# Patient Record
Sex: Female | Born: 1950 | Race: White | Hispanic: No | Smoking: Current every day smoker
Health system: Southern US, Community
[De-identification: ages and names within clinical notes are randomized; demographics above are authoritative.]

## PROBLEM LIST (undated history)

## (undated) DIAGNOSIS — I340 Nonrheumatic mitral (valve) insufficiency: Secondary | ICD-10-CM

## (undated) DIAGNOSIS — J42 Unspecified chronic bronchitis: Secondary | ICD-10-CM

## (undated) DIAGNOSIS — Z8719 Personal history of other diseases of the digestive system: Secondary | ICD-10-CM

## (undated) DIAGNOSIS — I499 Cardiac arrhythmia, unspecified: Secondary | ICD-10-CM

## (undated) DIAGNOSIS — I739 Peripheral vascular disease, unspecified: Secondary | ICD-10-CM

## (undated) DIAGNOSIS — K922 Gastrointestinal hemorrhage, unspecified: Secondary | ICD-10-CM

## (undated) DIAGNOSIS — R011 Cardiac murmur, unspecified: Secondary | ICD-10-CM

## (undated) DIAGNOSIS — N189 Chronic kidney disease, unspecified: Secondary | ICD-10-CM

## (undated) DIAGNOSIS — F329 Major depressive disorder, single episode, unspecified: Secondary | ICD-10-CM

## (undated) DIAGNOSIS — I509 Heart failure, unspecified: Secondary | ICD-10-CM

## (undated) DIAGNOSIS — J449 Chronic obstructive pulmonary disease, unspecified: Secondary | ICD-10-CM

## (undated) DIAGNOSIS — F32A Depression, unspecified: Secondary | ICD-10-CM

## (undated) DIAGNOSIS — I2510 Atherosclerotic heart disease of native coronary artery without angina pectoris: Secondary | ICD-10-CM

## (undated) DIAGNOSIS — M549 Dorsalgia, unspecified: Secondary | ICD-10-CM

## (undated) DIAGNOSIS — Z9981 Dependence on supplemental oxygen: Secondary | ICD-10-CM

## (undated) DIAGNOSIS — E119 Type 2 diabetes mellitus without complications: Secondary | ICD-10-CM

## (undated) DIAGNOSIS — I480 Paroxysmal atrial fibrillation: Secondary | ICD-10-CM

## (undated) DIAGNOSIS — G8929 Other chronic pain: Secondary | ICD-10-CM

## (undated) DIAGNOSIS — I219 Acute myocardial infarction, unspecified: Secondary | ICD-10-CM

## (undated) DIAGNOSIS — I10 Essential (primary) hypertension: Secondary | ICD-10-CM

## (undated) DIAGNOSIS — D649 Anemia, unspecified: Secondary | ICD-10-CM

## (undated) DIAGNOSIS — I771 Stricture of artery: Secondary | ICD-10-CM

## (undated) DIAGNOSIS — Z9289 Personal history of other medical treatment: Secondary | ICD-10-CM

## (undated) DIAGNOSIS — E785 Hyperlipidemia, unspecified: Secondary | ICD-10-CM

## (undated) DIAGNOSIS — I6529 Occlusion and stenosis of unspecified carotid artery: Secondary | ICD-10-CM

## (undated) DIAGNOSIS — K219 Gastro-esophageal reflux disease without esophagitis: Secondary | ICD-10-CM

## (undated) DIAGNOSIS — R9431 Abnormal electrocardiogram [ECG] [EKG]: Secondary | ICD-10-CM

## (undated) DIAGNOSIS — I714 Abdominal aortic aneurysm, without rupture: Secondary | ICD-10-CM

## (undated) DIAGNOSIS — K859 Acute pancreatitis without necrosis or infection, unspecified: Secondary | ICD-10-CM

## (undated) DIAGNOSIS — T4145XA Adverse effect of unspecified anesthetic, initial encounter: Secondary | ICD-10-CM

## (undated) HISTORY — DX: Chronic obstructive pulmonary disease, unspecified: J44.9

## (undated) HISTORY — DX: Chronic kidney disease, unspecified: N18.9

## (undated) HISTORY — DX: Abdominal aortic aneurysm, without rupture: I71.4

## (undated) HISTORY — DX: Hyperlipidemia, unspecified: E78.5

## (undated) HISTORY — PX: LACERATION REPAIR: SHX5168

## (undated) HISTORY — PX: ABDOMINAL HYSTERECTOMY: SHX81

## (undated) HISTORY — DX: Peripheral vascular disease, unspecified: I73.9

## (undated) HISTORY — DX: Essential (primary) hypertension: I10

## (undated) HISTORY — DX: Gastro-esophageal reflux disease without esophagitis: K21.9

## (undated) HISTORY — PX: CORONARY ANGIOPLASTY: SHX604

## (undated) HISTORY — DX: Acute myocardial infarction, unspecified: I21.9

## (undated) HISTORY — DX: Occlusion and stenosis of unspecified carotid artery: I65.29

## (undated) HISTORY — DX: Heart failure, unspecified: I50.9

## (undated) HISTORY — DX: Gastrointestinal hemorrhage, unspecified: K92.2

## (undated) HISTORY — DX: Paroxysmal atrial fibrillation: I48.0

## (undated) HISTORY — DX: Nonrheumatic mitral (valve) insufficiency: I34.0

## (undated) HISTORY — DX: Anemia, unspecified: D64.9

## (undated) HISTORY — PX: CAROTID ENDARTERECTOMY: SUR193

## (undated) HISTORY — PX: CORONARY STENT PLACEMENT: SHX1402

## (undated) HISTORY — PX: OTHER SURGICAL HISTORY: SHX169

## (undated) HISTORY — DX: Abnormal electrocardiogram (ECG) (EKG): R94.31

---
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
## 2001-11-20 ENCOUNTER — Emergency Department (HOSPITAL_COMMUNITY): Admission: EM | Admit: 2001-11-20 | Discharge: 2001-11-20 | Payer: Self-pay | Admitting: Emergency Medicine

## 2002-10-17 ENCOUNTER — Encounter: Admission: RE | Admit: 2002-10-17 | Discharge: 2002-10-17 | Payer: Self-pay | Admitting: Internal Medicine

## 2003-08-08 ENCOUNTER — Ambulatory Visit (HOSPITAL_COMMUNITY): Admission: RE | Admit: 2003-08-08 | Discharge: 2003-08-08 | Payer: Self-pay | Admitting: Vascular Surgery

## 2003-08-22 ENCOUNTER — Ambulatory Visit (HOSPITAL_COMMUNITY): Admission: RE | Admit: 2003-08-22 | Discharge: 2003-08-23 | Payer: Self-pay | Admitting: Vascular Surgery

## 2003-10-16 ENCOUNTER — Observation Stay (HOSPITAL_COMMUNITY): Admission: AD | Admit: 2003-10-16 | Discharge: 2003-10-17 | Payer: Self-pay | Admitting: Internal Medicine

## 2004-07-09 ENCOUNTER — Ambulatory Visit (HOSPITAL_COMMUNITY): Admission: RE | Admit: 2004-07-09 | Discharge: 2004-07-09 | Payer: Self-pay | Admitting: *Deleted

## 2004-07-09 ENCOUNTER — Encounter (INDEPENDENT_AMBULATORY_CARE_PROVIDER_SITE_OTHER): Payer: Self-pay | Admitting: *Deleted

## 2005-03-30 ENCOUNTER — Encounter: Admission: RE | Admit: 2005-03-30 | Discharge: 2005-03-30 | Payer: Self-pay | Admitting: Internal Medicine

## 2005-05-16 ENCOUNTER — Emergency Department (HOSPITAL_COMMUNITY): Admission: EM | Admit: 2005-05-16 | Discharge: 2005-05-16 | Payer: Self-pay | Admitting: Emergency Medicine

## 2005-06-02 ENCOUNTER — Emergency Department (HOSPITAL_COMMUNITY): Admission: EM | Admit: 2005-06-02 | Discharge: 2005-06-02 | Payer: Self-pay | Admitting: Emergency Medicine

## 2005-11-19 ENCOUNTER — Ambulatory Visit (HOSPITAL_COMMUNITY): Admission: RE | Admit: 2005-11-19 | Discharge: 2005-11-19 | Payer: Self-pay | Admitting: *Deleted

## 2005-11-19 ENCOUNTER — Encounter (INDEPENDENT_AMBULATORY_CARE_PROVIDER_SITE_OTHER): Payer: Self-pay | Admitting: Specialist

## 2005-12-31 ENCOUNTER — Ambulatory Visit (HOSPITAL_COMMUNITY): Admission: RE | Admit: 2005-12-31 | Discharge: 2005-12-31 | Payer: Self-pay | Admitting: Internal Medicine

## 2005-12-31 ENCOUNTER — Encounter: Payer: Self-pay | Admitting: Vascular Surgery

## 2006-05-16 ENCOUNTER — Inpatient Hospital Stay (HOSPITAL_COMMUNITY): Admission: EM | Admit: 2006-05-16 | Discharge: 2006-05-25 | Payer: Self-pay | Admitting: Emergency Medicine

## 2006-05-17 ENCOUNTER — Encounter (INDEPENDENT_AMBULATORY_CARE_PROVIDER_SITE_OTHER): Payer: Self-pay | Admitting: Cardiology

## 2006-08-31 ENCOUNTER — Encounter: Admission: RE | Admit: 2006-08-31 | Discharge: 2006-08-31 | Payer: Self-pay | Admitting: Internal Medicine

## 2007-07-29 ENCOUNTER — Emergency Department (HOSPITAL_COMMUNITY): Admission: EM | Admit: 2007-07-29 | Discharge: 2007-07-29 | Payer: Self-pay | Admitting: Emergency Medicine

## 2007-11-01 ENCOUNTER — Inpatient Hospital Stay (HOSPITAL_COMMUNITY): Admission: EM | Admit: 2007-11-01 | Discharge: 2007-11-04 | Payer: Self-pay | Admitting: Emergency Medicine

## 2007-12-19 ENCOUNTER — Encounter: Admission: RE | Admit: 2007-12-19 | Discharge: 2007-12-19 | Payer: Self-pay | Admitting: *Deleted

## 2008-01-05 DIAGNOSIS — I6529 Occlusion and stenosis of unspecified carotid artery: Secondary | ICD-10-CM

## 2008-01-05 DIAGNOSIS — I714 Abdominal aortic aneurysm, without rupture, unspecified: Secondary | ICD-10-CM

## 2008-01-05 HISTORY — DX: Occlusion and stenosis of unspecified carotid artery: I65.29

## 2008-01-05 HISTORY — DX: Abdominal aortic aneurysm, without rupture: I71.4

## 2008-01-05 HISTORY — DX: Abdominal aortic aneurysm, without rupture, unspecified: I71.40

## 2008-01-23 ENCOUNTER — Ambulatory Visit: Payer: Self-pay | Admitting: Internal Medicine

## 2008-01-23 ENCOUNTER — Ambulatory Visit: Payer: Self-pay | Admitting: Vascular Surgery

## 2008-01-24 ENCOUNTER — Ambulatory Visit: Payer: Self-pay | Admitting: Internal Medicine

## 2008-01-24 LAB — CONVERTED CEMR LAB
BUN: 6 mg/dL (ref 6–23)
Basophils Absolute: 0.1 10*3/uL (ref 0.0–0.1)
Basophils Relative: 0.8 % (ref 0.0–1.0)
CO2: 29 meq/L (ref 19–32)
Calcium: 9.7 mg/dL (ref 8.4–10.5)
Chloride: 107 meq/L (ref 96–112)
Creatinine, Ser: 1.2 mg/dL (ref 0.4–1.2)
Eosinophils Absolute: 0.3 10*3/uL (ref 0.0–0.7)
Eosinophils Relative: 3 % (ref 0.0–5.0)
GFR calc Af Amer: 60 mL/min
GFR calc non Af Amer: 50 mL/min
Glucose, Bld: 161 mg/dL — ABNORMAL HIGH (ref 70–99)
HCT: 36.2 % (ref 36.0–46.0)
Hemoglobin: 11.8 g/dL — ABNORMAL LOW (ref 12.0–15.0)
INR: 1 (ref 0.8–1.0)
Lymphocytes Relative: 13.4 % (ref 12.0–46.0)
MCHC: 32.7 g/dL (ref 30.0–36.0)
MCV: 91.3 fL (ref 78.0–100.0)
Monocytes Absolute: 0.5 10*3/uL (ref 0.1–1.0)
Monocytes Relative: 5.5 % (ref 3.0–12.0)
Neutro Abs: 7.1 10*3/uL (ref 1.4–7.7)
Neutrophils Relative %: 77.3 % — ABNORMAL HIGH (ref 43.0–77.0)
Platelets: 314 10*3/uL (ref 150–400)
Potassium: 3.8 meq/L (ref 3.5–5.1)
Prothrombin Time: 11.5 s (ref 10.9–13.3)
RBC: 3.96 M/uL (ref 3.87–5.11)
RDW: 14.9 % — ABNORMAL HIGH (ref 11.5–14.6)
Sodium: 141 meq/L (ref 135–145)
WBC: 9.2 10*3/uL (ref 4.5–10.5)
aPTT: 33.9 s — ABNORMAL HIGH (ref 21.7–29.8)

## 2008-01-26 ENCOUNTER — Ambulatory Visit (HOSPITAL_COMMUNITY): Admission: RE | Admit: 2008-01-26 | Discharge: 2008-01-26 | Payer: Self-pay | Admitting: Internal Medicine

## 2008-01-26 ENCOUNTER — Ambulatory Visit: Payer: Self-pay | Admitting: Internal Medicine

## 2008-02-02 ENCOUNTER — Ambulatory Visit: Payer: Self-pay | Admitting: Vascular Surgery

## 2008-02-02 ENCOUNTER — Encounter: Payer: Self-pay | Admitting: Vascular Surgery

## 2008-02-02 ENCOUNTER — Inpatient Hospital Stay (HOSPITAL_COMMUNITY): Admission: RE | Admit: 2008-02-02 | Discharge: 2008-02-03 | Payer: Self-pay | Admitting: Vascular Surgery

## 2008-02-13 ENCOUNTER — Ambulatory Visit: Payer: Self-pay | Admitting: Vascular Surgery

## 2008-03-05 ENCOUNTER — Ambulatory Visit: Payer: Self-pay | Admitting: Internal Medicine

## 2008-03-05 LAB — CONVERTED CEMR LAB
BUN: 12 mg/dL (ref 6–23)
Basophils Absolute: 0.1 10*3/uL (ref 0.0–0.1)
Basophils Relative: 1.2 % — ABNORMAL HIGH (ref 0.0–1.0)
CO2: 27 meq/L (ref 19–32)
Calcium: 10 mg/dL (ref 8.4–10.5)
Chloride: 108 meq/L (ref 96–112)
Creatinine, Ser: 1.3 mg/dL — ABNORMAL HIGH (ref 0.4–1.2)
Eosinophils Absolute: 0.4 10*3/uL (ref 0.0–0.7)
Eosinophils Relative: 5.2 % — ABNORMAL HIGH (ref 0.0–5.0)
GFR calc Af Amer: 55 mL/min
GFR calc non Af Amer: 45 mL/min
Glucose, Bld: 106 mg/dL — ABNORMAL HIGH (ref 70–99)
HCT: 33.1 % — ABNORMAL LOW (ref 36.0–46.0)
Hemoglobin: 11.3 g/dL — ABNORMAL LOW (ref 12.0–15.0)
INR: 0.9 (ref 0.8–1.0)
Lymphocytes Relative: 15.7 % (ref 12.0–46.0)
MCHC: 34.1 g/dL (ref 30.0–36.0)
MCV: 89.3 fL (ref 78.0–100.0)
Monocytes Absolute: 0.5 10*3/uL (ref 0.1–1.0)
Monocytes Relative: 6.1 % (ref 3.0–12.0)
Neutro Abs: 6.2 10*3/uL (ref 1.4–7.7)
Neutrophils Relative %: 71.8 % (ref 43.0–77.0)
Platelets: 298 10*3/uL (ref 150–400)
Potassium: 4.3 meq/L (ref 3.5–5.1)
Prothrombin Time: 10.7 s — ABNORMAL LOW (ref 10.9–13.3)
RBC: 3.71 M/uL — ABNORMAL LOW (ref 3.87–5.11)
RDW: 14.3 % (ref 11.5–14.6)
Sodium: 143 meq/L (ref 135–145)
WBC: 8.5 10*3/uL (ref 4.5–10.5)

## 2008-05-07 ENCOUNTER — Ambulatory Visit: Payer: Self-pay | Admitting: Internal Medicine

## 2008-05-07 LAB — CONVERTED CEMR LAB
ALT: 11 units/L (ref 0–35)
AST: 15 units/L (ref 0–37)
Albumin: 3.8 g/dL (ref 3.5–5.2)
Alkaline Phosphatase: 64 units/L (ref 39–117)
BUN: 13 mg/dL (ref 6–23)
Bilirubin, Direct: 0.1 mg/dL (ref 0.0–0.3)
CO2: 28 meq/L (ref 19–32)
Calcium: 9.5 mg/dL (ref 8.4–10.5)
Chloride: 108 meq/L (ref 96–112)
Cholesterol: 208 mg/dL (ref 0–200)
Creatinine, Ser: 1.2 mg/dL (ref 0.4–1.2)
Direct LDL: 78.9 mg/dL
GFR calc Af Amer: 60 mL/min
GFR calc non Af Amer: 50 mL/min
Glucose, Bld: 101 mg/dL — ABNORMAL HIGH (ref 70–99)
HDL: 36.7 mg/dL — ABNORMAL LOW (ref >39.0)
Potassium: 4.2 meq/L (ref 3.5–5.1)
Sodium: 142 meq/L (ref 135–145)
Total Bilirubin: 0.6 mg/dL (ref 0.3–1.2)
Total CHOL/HDL Ratio: 5.7
Total Protein: 7.2 g/dL (ref 6.0–8.3)
Triglycerides: 400 mg/dL (ref 0–149)
VLDL: 80 mg/dL — ABNORMAL HIGH (ref 0–40)

## 2008-07-16 ENCOUNTER — Ambulatory Visit: Payer: Self-pay | Admitting: Internal Medicine

## 2008-08-16 ENCOUNTER — Ambulatory Visit: Payer: Self-pay | Admitting: Internal Medicine

## 2008-08-16 LAB — CONVERTED CEMR LAB
BUN: 14 mg/dL (ref 6–23)
CO2: 21 meq/L (ref 19–32)
Calcium: 9 mg/dL (ref 8.4–10.5)
Chloride: 108 meq/L (ref 96–112)
Creatinine, Ser: 1.57 mg/dL — ABNORMAL HIGH (ref 0.40–1.20)
Glucose, Bld: 114 mg/dL — ABNORMAL HIGH (ref 70–99)
Potassium: 4.3 meq/L (ref 3.5–5.3)
Sodium: 142 meq/L (ref 135–145)

## 2008-09-03 ENCOUNTER — Encounter: Payer: Self-pay | Admitting: Internal Medicine

## 2008-09-03 ENCOUNTER — Ambulatory Visit: Payer: Self-pay | Admitting: Cardiovascular Disease

## 2008-09-03 LAB — CONVERTED CEMR LAB
BUN: 16 mg/dL (ref 6–23)
CO2: 20 meq/L (ref 19–32)
Calcium: 9 mg/dL (ref 8.4–10.5)
Chloride: 110 meq/L (ref 96–112)
Creatinine, Ser: 1.54 mg/dL — ABNORMAL HIGH (ref 0.40–1.20)
Glucose, Bld: 99 mg/dL (ref 70–99)
Potassium: 4.4 meq/L (ref 3.5–5.3)
Sodium: 142 meq/L (ref 135–145)

## 2008-11-20 DIAGNOSIS — I25119 Atherosclerotic heart disease of native coronary artery with unspecified angina pectoris: Secondary | ICD-10-CM | POA: Insufficient documentation

## 2008-11-20 DIAGNOSIS — I10 Essential (primary) hypertension: Secondary | ICD-10-CM

## 2008-11-20 DIAGNOSIS — E785 Hyperlipidemia, unspecified: Secondary | ICD-10-CM | POA: Insufficient documentation

## 2008-11-20 DIAGNOSIS — I714 Abdominal aortic aneurysm, without rupture, unspecified: Secondary | ICD-10-CM | POA: Insufficient documentation

## 2008-11-20 DIAGNOSIS — F17200 Nicotine dependence, unspecified, uncomplicated: Secondary | ICD-10-CM | POA: Insufficient documentation

## 2008-11-21 ENCOUNTER — Ambulatory Visit: Payer: Self-pay | Admitting: Internal Medicine

## 2008-11-21 ENCOUNTER — Encounter: Payer: Self-pay | Admitting: Internal Medicine

## 2008-11-21 DIAGNOSIS — I739 Peripheral vascular disease, unspecified: Secondary | ICD-10-CM

## 2008-11-21 LAB — CONVERTED CEMR LAB
ALT: 9 units/L (ref 0–35)
AST: 15 units/L (ref 0–37)
Albumin: 4.5 g/dL (ref 3.5–5.2)
Alkaline Phosphatase: 39 units/L (ref 39–117)
BUN: 12 mg/dL (ref 6–23)
CO2: 19 meq/L (ref 19–32)
Calcium: 9.3 mg/dL (ref 8.4–10.5)
Chloride: 110 meq/L (ref 96–112)
Cholesterol: 172 mg/dL (ref 0–200)
Creatinine, Ser: 1.8 mg/dL — ABNORMAL HIGH (ref 0.40–1.20)
Glucose, Bld: 114 mg/dL — ABNORMAL HIGH (ref 70–99)
HDL: 44 mg/dL (ref >39)
LDL Cholesterol: 85 mg/dL (ref 0–99)
Potassium: 4.3 meq/L (ref 3.5–5.3)
Sodium: 143 meq/L (ref 135–145)
Total Bilirubin: 0.4 mg/dL (ref 0.3–1.2)
Total CHOL/HDL Ratio: 3.9
Total Protein: 7.2 g/dL (ref 6.0–8.3)
Triglycerides: 216 mg/dL — ABNORMAL HIGH (ref <150)
VLDL: 43 mg/dL — ABNORMAL HIGH (ref 0–40)

## 2008-12-23 ENCOUNTER — Ambulatory Visit: Payer: Self-pay | Admitting: Internal Medicine

## 2008-12-23 ENCOUNTER — Encounter: Payer: Self-pay | Admitting: Internal Medicine

## 2008-12-23 LAB — CONVERTED CEMR LAB
BUN: 20 mg/dL (ref 6–23)
CO2: 21 meq/L (ref 19–32)
Calcium: 9.3 mg/dL (ref 8.4–10.5)
Chloride: 110 meq/L (ref 96–112)
Creatinine, Ser: 1.8 mg/dL — ABNORMAL HIGH (ref 0.40–1.20)
Glucose, Bld: 110 mg/dL — ABNORMAL HIGH (ref 70–99)
Potassium: 4.3 meq/L (ref 3.5–5.3)
Sodium: 145 meq/L (ref 135–145)

## 2008-12-27 ENCOUNTER — Telehealth: Payer: Self-pay | Admitting: Internal Medicine

## 2009-03-25 ENCOUNTER — Ambulatory Visit: Payer: Self-pay | Admitting: Vascular Surgery

## 2009-03-28 ENCOUNTER — Ambulatory Visit: Payer: Self-pay | Admitting: Internal Medicine

## 2009-03-28 DIAGNOSIS — R634 Abnormal weight loss: Secondary | ICD-10-CM

## 2009-03-28 DIAGNOSIS — R002 Palpitations: Secondary | ICD-10-CM

## 2009-04-09 ENCOUNTER — Encounter: Payer: Self-pay | Admitting: Cardiology

## 2009-04-23 ENCOUNTER — Encounter: Payer: Self-pay | Admitting: Cardiology

## 2009-12-05 ENCOUNTER — Encounter: Payer: Self-pay | Admitting: Internal Medicine

## 2009-12-06 ENCOUNTER — Encounter: Payer: Self-pay | Admitting: Internal Medicine

## 2009-12-07 ENCOUNTER — Encounter: Payer: Self-pay | Admitting: Internal Medicine

## 2009-12-09 ENCOUNTER — Telehealth (INDEPENDENT_AMBULATORY_CARE_PROVIDER_SITE_OTHER): Payer: Self-pay | Admitting: *Deleted

## 2009-12-09 ENCOUNTER — Ambulatory Visit: Payer: Self-pay | Admitting: Internal Medicine

## 2009-12-09 DIAGNOSIS — I4891 Unspecified atrial fibrillation: Secondary | ICD-10-CM | POA: Insufficient documentation

## 2009-12-09 LAB — CONVERTED CEMR LAB: POC INR: 5

## 2009-12-10 ENCOUNTER — Telehealth (INDEPENDENT_AMBULATORY_CARE_PROVIDER_SITE_OTHER): Payer: Self-pay | Admitting: *Deleted

## 2009-12-11 ENCOUNTER — Ambulatory Visit: Payer: Self-pay | Admitting: Internal Medicine

## 2009-12-11 LAB — CONVERTED CEMR LAB
Basophils Absolute: 0.1 10*3/uL (ref 0.0–0.1)
Basophils Relative: 1.1 % (ref 0.0–3.0)
Eosinophils Absolute: 0.2 10*3/uL (ref 0.0–0.7)
Eosinophils Relative: 3.1 % (ref 0.0–5.0)
HCT: 39.1 % (ref 36.0–46.0)
Hemoglobin: 13.4 g/dL (ref 12.0–15.0)
Lymphocytes Relative: 11.1 % — ABNORMAL LOW (ref 12.0–46.0)
Lymphs Abs: 0.8 10*3/uL (ref 0.7–4.0)
MCHC: 34.1 g/dL (ref 30.0–36.0)
MCV: 90.8 fL (ref 78.0–100.0)
Monocytes Absolute: 0.7 10*3/uL (ref 0.1–1.0)
Monocytes Relative: 10.2 % (ref 3.0–12.0)
Neutro Abs: 5.2 10*3/uL (ref 1.4–7.7)
Neutrophils Relative %: 74.5 % (ref 43.0–77.0)
Platelets: 269 10*3/uL (ref 150.0–400.0)
RBC: 4.31 M/uL (ref 3.87–5.11)
RDW: 15.6 % — ABNORMAL HIGH (ref 11.5–14.6)
WBC: 6.9 10*3/uL (ref 4.5–10.5)

## 2009-12-12 ENCOUNTER — Ambulatory Visit: Payer: Self-pay | Admitting: Cardiology

## 2009-12-18 ENCOUNTER — Ambulatory Visit: Payer: Self-pay | Admitting: Internal Medicine

## 2009-12-18 LAB — CONVERTED CEMR LAB
INR: 2.6
POC INR: 2.6

## 2009-12-24 ENCOUNTER — Encounter (INDEPENDENT_AMBULATORY_CARE_PROVIDER_SITE_OTHER): Payer: Self-pay | Admitting: *Deleted

## 2009-12-24 ENCOUNTER — Ambulatory Visit: Payer: Self-pay | Admitting: Cardiology

## 2009-12-24 LAB — CONVERTED CEMR LAB: POC INR: 2.4

## 2010-01-09 ENCOUNTER — Ambulatory Visit: Payer: Self-pay | Admitting: Internal Medicine

## 2010-01-09 LAB — CONVERTED CEMR LAB: POC INR: 1.9

## 2010-01-23 ENCOUNTER — Ambulatory Visit: Payer: Self-pay | Admitting: Internal Medicine

## 2010-01-23 LAB — CONVERTED CEMR LAB: POC INR: 1.5

## 2010-02-06 ENCOUNTER — Ambulatory Visit: Payer: Self-pay | Admitting: Cardiovascular Disease

## 2010-02-06 LAB — CONVERTED CEMR LAB: POC INR: 2.3

## 2010-03-03 ENCOUNTER — Ambulatory Visit: Payer: Self-pay | Admitting: Internal Medicine

## 2010-03-03 LAB — CONVERTED CEMR LAB: POC INR: 2.9

## 2010-03-31 ENCOUNTER — Ambulatory Visit: Payer: Self-pay | Admitting: Cardiology

## 2010-03-31 LAB — CONVERTED CEMR LAB: POC INR: 1.2

## 2010-04-13 ENCOUNTER — Ambulatory Visit: Payer: Self-pay | Admitting: Cardiovascular Disease

## 2010-04-13 LAB — CONVERTED CEMR LAB: POC INR: 2

## 2010-04-27 ENCOUNTER — Ambulatory Visit: Payer: Self-pay | Admitting: Cardiology

## 2010-04-27 LAB — CONVERTED CEMR LAB: POC INR: 2.6

## 2010-04-28 ENCOUNTER — Encounter: Payer: Self-pay | Admitting: Internal Medicine

## 2010-04-28 ENCOUNTER — Ambulatory Visit: Payer: Self-pay | Admitting: Vascular Surgery

## 2010-05-07 DIAGNOSIS — K922 Gastrointestinal hemorrhage, unspecified: Secondary | ICD-10-CM

## 2010-05-07 HISTORY — DX: Gastrointestinal hemorrhage, unspecified: K92.2

## 2010-05-21 ENCOUNTER — Ambulatory Visit: Payer: Self-pay | Admitting: Cardiology

## 2010-05-21 LAB — CONVERTED CEMR LAB: POC INR: 2.4

## 2010-06-18 ENCOUNTER — Ambulatory Visit: Payer: Self-pay | Admitting: Internal Medicine

## 2010-06-18 LAB — CONVERTED CEMR LAB: POC INR: 5

## 2010-06-22 ENCOUNTER — Telehealth: Payer: Self-pay | Admitting: Internal Medicine

## 2010-06-25 ENCOUNTER — Telehealth (INDEPENDENT_AMBULATORY_CARE_PROVIDER_SITE_OTHER): Payer: Self-pay | Admitting: Pharmacist

## 2010-07-03 ENCOUNTER — Telehealth: Payer: Self-pay | Admitting: Internal Medicine

## 2010-07-15 ENCOUNTER — Ambulatory Visit: Payer: Self-pay | Admitting: Cardiovascular Disease

## 2010-07-15 ENCOUNTER — Ambulatory Visit: Payer: Self-pay | Admitting: Internal Medicine

## 2010-07-15 LAB — CONVERTED CEMR LAB: POC INR: 1.2

## 2010-07-17 ENCOUNTER — Ambulatory Visit: Payer: Self-pay | Admitting: Internal Medicine

## 2010-07-17 ENCOUNTER — Telehealth (INDEPENDENT_AMBULATORY_CARE_PROVIDER_SITE_OTHER): Payer: Self-pay | Admitting: *Deleted

## 2010-07-17 ENCOUNTER — Encounter: Payer: Self-pay | Admitting: Internal Medicine

## 2010-07-22 ENCOUNTER — Ambulatory Visit: Payer: Self-pay | Admitting: Cardiology

## 2010-07-24 ENCOUNTER — Telehealth: Payer: Self-pay | Admitting: Internal Medicine

## 2010-08-05 ENCOUNTER — Ambulatory Visit: Payer: Self-pay | Admitting: Pulmonary Disease

## 2010-08-05 DIAGNOSIS — R0609 Other forms of dyspnea: Secondary | ICD-10-CM

## 2010-08-05 DIAGNOSIS — R0989 Other specified symptoms and signs involving the circulatory and respiratory systems: Secondary | ICD-10-CM

## 2010-08-05 LAB — CONVERTED CEMR LAB
BUN: 22 mg/dL (ref 6–23)
Basophils Absolute: 0.1 10*3/uL (ref 0.0–0.1)
Basophils Relative: 0.8 % (ref 0.0–3.0)
CO2: 26 meq/L (ref 19–32)
Calcium: 9.2 mg/dL (ref 8.4–10.5)
Chloride: 106 meq/L (ref 96–112)
Creatinine, Ser: 1.7 mg/dL — ABNORMAL HIGH (ref 0.4–1.2)
Eosinophils Absolute: 0.3 10*3/uL (ref 0.0–0.7)
Eosinophils Relative: 4.3 % (ref 0.0–5.0)
GFR calc non Af Amer: 32.23 mL/min (ref >60)
Glucose, Bld: 108 mg/dL — ABNORMAL HIGH (ref 70–99)
HCT: 32.3 % — ABNORMAL LOW (ref 36.0–46.0)
Hemoglobin: 11.1 g/dL — ABNORMAL LOW (ref 12.0–15.0)
Lymphocytes Relative: 17.6 % (ref 12.0–46.0)
Lymphs Abs: 1.2 10*3/uL (ref 0.7–4.0)
MCHC: 34.5 g/dL (ref 30.0–36.0)
MCV: 93.1 fL (ref 78.0–100.0)
Monocytes Absolute: 0.6 10*3/uL (ref 0.1–1.0)
Monocytes Relative: 8.1 % (ref 3.0–12.0)
Neutro Abs: 4.9 10*3/uL (ref 1.4–7.7)
Neutrophils Relative %: 69.2 % (ref 43.0–77.0)
Platelets: 225 10*3/uL (ref 150.0–400.0)
Potassium: 4.6 meq/L (ref 3.5–5.1)
RBC: 3.48 M/uL — ABNORMAL LOW (ref 3.87–5.11)
RDW: 14.2 % (ref 11.5–14.6)
Sodium: 141 meq/L (ref 135–145)
WBC: 7.1 10*3/uL (ref 4.5–10.5)

## 2010-08-07 ENCOUNTER — Ambulatory Visit: Payer: Self-pay | Admitting: Cardiovascular Disease

## 2010-08-07 LAB — CONVERTED CEMR LAB: POC INR: 5.5

## 2010-08-13 ENCOUNTER — Telehealth: Payer: Self-pay | Admitting: Internal Medicine

## 2010-08-14 ENCOUNTER — Ambulatory Visit: Payer: Self-pay | Admitting: Internal Medicine

## 2010-08-14 LAB — CONVERTED CEMR LAB: POC INR: 5

## 2010-08-21 ENCOUNTER — Encounter: Payer: Self-pay | Admitting: Pulmonary Disease

## 2010-08-21 ENCOUNTER — Ambulatory Visit: Payer: Self-pay | Admitting: Cardiology

## 2010-08-24 ENCOUNTER — Ambulatory Visit: Payer: Self-pay | Admitting: Pulmonary Disease

## 2010-09-04 ENCOUNTER — Ambulatory Visit: Payer: Self-pay | Admitting: Pulmonary Disease

## 2010-09-04 ENCOUNTER — Ambulatory Visit: Payer: Self-pay | Admitting: Cardiovascular Disease

## 2010-09-04 DIAGNOSIS — G4733 Obstructive sleep apnea (adult) (pediatric): Secondary | ICD-10-CM | POA: Insufficient documentation

## 2010-09-04 DIAGNOSIS — R0902 Hypoxemia: Secondary | ICD-10-CM | POA: Insufficient documentation

## 2010-09-04 LAB — CONVERTED CEMR LAB: POC INR: 2.9

## 2010-09-23 ENCOUNTER — Encounter: Payer: Self-pay | Admitting: Internal Medicine

## 2010-09-23 ENCOUNTER — Ambulatory Visit
Admission: RE | Admit: 2010-09-23 | Discharge: 2010-09-23 | Payer: Self-pay | Source: Home / Self Care | Attending: Internal Medicine | Admitting: Internal Medicine

## 2010-10-02 ENCOUNTER — Ambulatory Visit: Admit: 2010-10-02 | Payer: Self-pay

## 2010-10-06 ENCOUNTER — Ambulatory Visit: Admit: 2010-10-06 | Payer: Self-pay

## 2010-10-06 NOTE — Letter (Signed)
Summary: New Patient Letter  [facility]   [place], [place] [address]   Phone: [phone]  Fax: [phone]       12/24/2009 MRN: [id number]  [name] [address] [place], [place]  [address]  Dear Ms. Petrizzo,   Welcome to [facility] and thank you for choosing [place] as your Primary Care Providers. Enclosed you will find information about our practice that we hope you find helpful. We have also enclosed forms to be filled out prior to your visit. This will provide [place] with the necessary information and facilitate your being seen in a timely manner. If you have any questions, please call us at:  [phone]        and we will be happy to assist you. We look forward to seeing you at your scheduled appointment time.  Appointment   MAY [phone] @ 10:00AM            with Dr.  Birdie Riddle               Sincerely,  [facility]  Please arrive 15 minutes early for your first appointment and bring your insurance card. Co-pay is required at the time of your visit.  *****Please call the office if you are not able to keep this appointment. There is a charge of $50.00 if any appointment is not cancelled or rescheduled within 24 hours.

## 2010-10-06 NOTE — Medication Information (Signed)
Summary: rov/sp  Anticoagulant Therapy  Managed by: Tula Nakayama, RN, BSN Referring MD: Dr Haroldine Laws PCP: Bernerd Limbo, [facility] MD: Rayann Heman MD, Jeneen Rinks Indication 1: Atrial Fibrillation Lab Used: [facility] Site: [place] INR POC 5.0 INR RANGE 2.0-3.0  Dietary changes: no    Health status changes: no    Bleeding/hemorrhagic complications: no    Recent/future hospitalizations: yes       Details: Went to ER on Wed for Afib, Started on Cardizem   Any changes in medication regimen? yes       Details: Tylenol PRN 2 tabs daily  Recent/future dental: no  Any missed doses?: no       Is patient compliant with meds? yes       Allergies: 1)  ! Codeine 2)  ! Clonidine Hcl  Anticoagulation Management History:      The patient is taking warfarin and comes in today for a routine follow up visit.  Positive risk factors for bleeding include presence of serious comorbidities.  Negative risk factors for bleeding include an age less than 53 years old.  The bleeding index is 'intermediate risk'.  Positive CHADS2 values include History of HTN.  Negative CHADS2 values include Age > 40 years old.  Her last INR was 2.6.  Anticoagulation responsible provider: Allred MD, Jeneen Rinks.  INR POC: 5.0.  Cuvette Lot#: [id number].  Exp: 10/2011.    Anticoagulation Management Assessment/Plan:      The patient's current anticoagulation dose is Coumadin 1 mg tabs: Take as Directed..  The target INR is 2.0-3.0.  The next INR is due 08/21/2010.  Anticoagulation instructions were given to patient.  Results were reviewed/authorized by Tula Nakayama, RN, BSN.  She was notified by Tula Nakayama, RN, BSN.         Prior Anticoagulation Instructions: INR 5.5  Skip today and tomorrows dose of Coumadin then decrease dose to 1 tablet every day.  Recheck INR in 1 week.   Current Anticoagulation Instructions: INR 5.0 Skip today and Saturdays dose then change dose to  1 pill everyday except 1/2 pill on Mondays and Fridays. Recheck in one week.

## 2010-10-06 NOTE — Medication Information (Signed)
Summary: rov/cb  Anticoagulant Therapy  Managed by: Gwynneth Albright, PharmD PCP: Charlynn Grimes in [place], [place] Supervising MD: Verl Blalock MD, Marcello Moores Indication 1: Atrial Fibrillation [place] Site: [place] INR RANGE 2.0-3.0  Dietary changes: no    Health status changes: no    Bleeding/hemorrhagic complications: no    Recent/future hospitalizations: no    Any changes in medication regimen? no    Recent/future dental: no  Any missed doses?: no       Is patient compliant with meds? yes       Allergies: 1)  ! Codeine 2)  ! Clonidine Hcl  Anticoagulation Management History:      Positive risk factors for bleeding include presence of serious comorbidities.  Negative risk factors for bleeding include an age less than 33 years old.  The bleeding index is 'intermediate risk'.  Positive CHADS2 values include History of HTN.  Negative CHADS2 values include Age > 89 years old.  Her last INR was 0.9 RATIO.  Anticoagulation responsible provider: Verl Blalock MD, Marcello Moores.  Exp: 01/2011.    Anticoagulation Management Assessment/Plan:      The patient's current anticoagulation dose is Coumadin 5 mg tabs: Take 1 tablet daily or as directed by Coumadin Clinic..  The next INR is due 12/18/2009.  Results were reviewed/authorized by Gwynneth Albright, PharmD.  She was notified by Gwynneth Albright, PharmD.         Prior Anticoagulation Instructions: INR 5.0. Hold Coumadin today and tomorrow, then take 1 tablet daily of the Coumadin 1 mg tablets.  Recheck on Friday.  Current Anticoagulation Instructions: INR 2.6. Take 1 tablet daily. Recheck on Thursday.

## 2010-10-06 NOTE — Assessment & Plan Note (Signed)
Summary: eph/ gd   Visit Type:  post hospital Primary Provider:  Charlynn Grimes in [place], [place]  CC:  palpitations.  History of Present Illness: 60 y/o woman with severe vascular disease as listed below. Returns for routine f/u.  [place]. Earlier in the week was admitted to [facility] with palpitations in rapid atrial fib with HRs up to 150-160. Treated with cardizem and HRs got down to 40. Thought she might have tachy-brady. Converted spontaneously after about 6 or 7 hours. Troponin went up to 0.28. No CP.  Told she might need a pacer in future. Was started on coumadin.  No bleeding INR checked Tuesday and it was 5.o and coumadin dose adjusted. No CP. Mild DOE. No change. No HF. Occasional brief flutters no sustained palpitations. Snores heavily.  Using lots of salt. Checks BP regularly mean BP about 150/70  Also got 2u RBCs for iron deficinecy anemia.   Still smoking about 4-5 cigs/day trying hard to quit.   Preoperative Cardiovascular Risk Evaluation:  Clinical Predictors of Increased Perioperative Cardiovascular Risk (MI, CHF, Death):     Minor Risk:    Advanced age (>65 years):     no    Current Medications (verified): 1)  Lisinopril-Hydrochlorothiazide 20-25 Mg Tabs (Lisinopril-Hydrochlorothiazide) .Marland Kitchen.. 1 By Mouth Daily 2)  Hydralazine Hcl 100 Mg Tabs (Hydralazine Hcl) .Marland Kitchen.. 1 By Mouth Twice Daily 3)  Spironolactone 25 Mg Tabs (Spironolactone) .... Take One Tablet By Mouth Daily 4)  Carvedilol 25 Mg Tabs (Carvedilol) .... Take One Tablet By Mouth Twice A Day 5)  Fenofibrate Micronized 134 Mg Caps (Fenofibrate Micronized) .Marland Kitchen.. 1 By Mouth At Night 6)  Omeprazole 20 Mg Cpdr (Omeprazole) .Marland Kitchen.. 1 By Mouth Daily 7)  Simvastatin 80 Mg Tabs (Simvastatin) .... Take One Tablet By Mouth Daily At Bedtime 8)  Amlodipine Besylate 10 Mg Tabs (Amlodipine Besylate) .... Take One Tablet By Mouth Daily 9)  Aspirin Ec 325 Mg Tbec (Aspirin) .... Take 3   Tablets By Mouth Daily ( Hold) 10)  Cyanocobalamin 1000 Mcg/ml Soln (Cyanocobalamin) .... Once A Mth 11)  Vitamin D3 1000 Unit Tabs (Cholecalciferol) .... Two Times A Day 12)  Multivitamins   Tabs (Multiple Vitamin) .... Once Daily 13)  Trazodone Hcl 50 Mg Tabs (Trazodone Hcl) .Marland Kitchen.. 1 -2 At Bedtime As Needed 14)  Wellbutrin Sr 150 Mg Xr12h-Tab (Bupropion Hcl) .... 2 in The Morning A 1 At Night 15)  Coumadin 5 Mg Tabs (Warfarin Sodium) .... Take 1 Tablet Daily or As Directed By Coumadin Clinic. 16)  Citalopram Hydrobromide 20 Mg Tabs (Citalopram Hydrobromide) .... Once Daily  Allergies (verified): 1)  ! Codeine 2)  ! Clonidine Hcl  Past History:  Past Medical History:  1. CAD      a. cath 5/09 heavily calcified vessels. LAD 60,60m, 90 distal, LCX 60, 40 RCA 40s. normal EF            -->medical Rx  2. Severe PAD      a. s/p bilateral renal artery stents. moderate in-stent restenosis      b. AAA (3.7 x 3.3) by u/s 5/09  3. Carotid stenosis      a. s/p L CEA 5/09  4. Hypertension.   5. Hyperlipidemia.   6. COPD with ongoing tobacco use   7. GERD with hiatal hernia.   8. DM2 9. Paroxysmal AF  Review of Systems       As per HPI and past medical history; otherwise  all systems negative.   Vital Signs:  Patient profile:   60 year old female Height:      5.6 inches Weight:      147 pounds BMI:     3307.59 Pulse rate:   67 / minute BP sitting:   172 / 76  (left arm) Cuff size:   regular  Vitals Entered By: Mignon Pine, RMA (December 11, 2009 4:14 PM)  Physical Exam  General:  Gen: well appearing. no resp difficulty HEENT: normal Neck: supple. no JVD. Carotids 2+ bilat; Left CEA. + L bruit. No lymphadenopathy or thryomegaly appreciated. Cor: PMI nondisplaced. Regular rate & rhythm. No rubs, gallops, murmur. Lungs: clear Abdomen: soft, nontender, nondistended. No hepatosplenomegaly. No bruits or masses. Good bowel sounds. Extremities: no cyanosis, clubbing, rash, edema. DP  trace L, 2+R  no sores Neuro: alert & orientedx3, cranial nerves grossly intact. moves all 4 extremities w/o difficulty. affect pleasant    Impression & Recommendations:  Problem # 1:  CAD, NATIVE VESSEL (ICD-414.01) Stable. No evidence of ischemia. Continue current regimen.  Problem # 2:  ATRIAL FIBRILLATION (ICD-427.31) Quiescent. 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 if recurs may benefit from sotalol. Continue coumadin. For INR recheck tomorrow.  Problem # 3:  TOBACCO ABUSE (ICD-305.1) Counseled on need to stop smoking.  Problem # 4:  PVD (ICD-443.9) Followed by Dr. [name].  Problem # 5:  HYPERTENSION, BENIGN (ICD-401.1) BP remains elevated. Suspect may be related to salt. Will cut down salt and have her keep BP log.  Med options limited at this point as she can't tolerate clonidine. May consider doubling norvasc to 10 two times a day or adding doxazosin.  Problem # 6:  HYPERLIPIDEMIA-MIXED (B2193296.4) Needs aggressive lipid management with goal LDL < 70. Followed by PCP. Once Lipitor is generic will switch simva 80 to Lipitor 80 due to [facility] warning with high-dose simva.  Other Orders: EKG w/ Interpretation (93000)  Patient Instructions: 1)  Your physician has requested that you limit the intake of sodium (salt) in your diet to two grams daily. 2)  Follow up in 2 months

## 2010-10-06 NOTE — Medication Information (Signed)
Summary: ROV/TP  Anticoagulant Therapy  Managed by: Porfirio Oar, PharmD Referring MD: Dr Haroldine Laws PCP: Bernerd Limbo, [facility] MD: Percival Spanish MD, Jeneen Rinks Indication 1: Atrial Fibrillation [place] Site: [place] INR POC 2.6 INR RANGE 2.0-3.0  Dietary changes: no    Health status changes: no    Bleeding/hemorrhagic complications: no    Recent/future hospitalizations: no    Any changes in medication regimen? no    Recent/future dental: no  Any missed doses?: no       Is patient compliant with meds? yes       Allergies: 1)  ! Codeine 2)  ! Clonidine Hcl  Anticoagulation Management History:      The patient is taking warfarin and comes in today for a routine follow up visit.  Positive risk factors for bleeding include presence of serious comorbidities.  Negative risk factors for bleeding include an age less than 65 years old.  The bleeding index is 'intermediate risk'.  Positive CHADS2 values include History of HTN.  Negative CHADS2 values include Age > 52 years old.  Her last INR was 2.6.  Anticoagulation responsible provider: Percival Spanish MD, Jeneen Rinks.  INR POC: 2.6.  Cuvette Lot#: [id number].  Exp: 06/2011.    Anticoagulation Management Assessment/Plan:      The patient's current anticoagulation dose is Coumadin 1 mg tabs: Take as Directed..  The target INR is 2.0-3.0.  The next INR is due 05/20/2010.  Anticoagulation instructions were given to patient.  Results were reviewed/authorized by Porfirio Oar, PharmD.  She was notified by Aubery Lapping, PharmD Candidate.         Prior Anticoagulation Instructions: INR:  2.0  Continue Coumadin 1 1/2 tablet on Monday and 1 tablet other days of the week.    Current Anticoagulation Instructions: INR 2.6  Continue 1 tablet daily except 1.5 tablets on Mon.  Return to clinic in 3 weeks.

## 2010-10-06 NOTE — Progress Notes (Signed)
Summary: Pt in hospital   Phone Note Call from Patient   Caller: Patient Summary of Call: Pt called to report she is in [facility] with black stools.  Asked pt to call when she is discharged.  Initial call taken by: Porfirio Oar PharmD,  June 22, 2010 4:59 PM

## 2010-10-06 NOTE — Medication Information (Signed)
Summary: rov/sp  Anticoagulant Therapy  Managed by: Freddrick March, RN, BSN Referring MD: Dr Haroldine Laws PCP: Bernerd Limbo, [facility] MD: Aundra Dubin MD, Kirk Ruths Indication 1: Atrial Fibrillation [place] Site: [place] INR POC 2.4 INR RANGE 2.0-3.0  Dietary changes: no    Health status changes: no    Bleeding/hemorrhagic complications: no    Recent/future hospitalizations: no    Any changes in medication regimen? no    Recent/future dental: no  Any missed doses?: no       Is patient compliant with meds? yes       Allergies: 1)  ! Codeine 2)  ! Clonidine Hcl  Anticoagulation Management History:      The patient is taking warfarin and comes in today for a routine follow up visit.  Positive risk factors for bleeding include presence of serious comorbidities.  Negative risk factors for bleeding include an age less than 18 years old.  The bleeding index is 'intermediate risk'.  Positive CHADS2 values include History of HTN.  Negative CHADS2 values include Age > 82 years old.  Her last INR was 2.6.  Anticoagulation responsible [place] Trillo: Aundra Dubin MD, [place].  INR POC: 2.4.  Cuvette Lot#: [id number].  Exp: 07/2011.    Anticoagulation Management Assessment/Plan:      The patient's current anticoagulation dose is Coumadin 1 mg tabs: Take as Directed..  The target INR is 2.0-3.0.  The next INR is due 06/18/2010.  Anticoagulation instructions were given to patient.  Results were reviewed/authorized by Freddrick March, RN, BSN.  She was notified by Freddrick March RN.         Prior Anticoagulation Instructions: INR 2.6  Continue 1 tablet daily except 1.5 tablets on Mon.  Return to clinic in 3 weeks.  Current Anticoagulation Instructions: INR 2.4  Continue on same dosage 1 tablet daily except 1.5 tablets on Mondays.  Recheck in 4 weeks.

## 2010-10-06 NOTE — Medication Information (Signed)
Summary: rov/eac  Anticoagulant Therapy  Managed by: Tula Nakayama, RN, BSN Referring MD: Dr Haroldine Laws PCP: Bernerd Limbo, [facility] MD: Johnsie Cancel MD, Collier Salina Indication 1: Atrial Fibrillation Lab Used: [facility] Site: [place] INR POC 1.2 INR RANGE 2.0-3.0  Dietary changes: no    Health status changes: no    Bleeding/hemorrhagic complications: no    Recent/future hospitalizations: yes       Details: 07/22/10 Admitted to [facility] for [facility]IB, Clauderized area of bleed on 07/23/10, discharged on 07/24/10. Readmitted to [facility] on 11/23/11for AFib and discharged on 07/31/10  Any changes in medication regimen? no       Details: Increased Prilosec to 2 tabs  Recent/future dental: no  Any missed doses?: no       Is patient compliant with meds? yes      Comments: Restarted last Thursday s/p GIB.   Allergies: 1)  ! Codeine 2)  ! Clonidine Hcl  Anticoagulation Management History:      The patient is taking warfarin and comes in today for a routine follow up visit.  Positive risk factors for bleeding include presence of serious comorbidities.  Negative risk factors for bleeding include an age less than 73 years old.  The bleeding index is 'intermediate risk'.  Positive CHADS2 values include History of HTN.  Negative CHADS2 values include Age > 63 years old.  Her last INR was 2.6.  Anticoagulation responsible provider: Johnsie Cancel MD, Collier Salina.  INR POC: 1.2.  Cuvette Lot#: [id number].  Exp: 07/2011.    Anticoagulation Management Assessment/Plan:      The patient's current anticoagulation dose is Coumadin 1 mg tabs: Take as Directed..  The target INR is 2.0-3.0.  The next INR is due 07/22/2010.  Anticoagulation instructions were given to patient.  Results were reviewed/authorized by Tula Nakayama, RN, BSN.  She was notified by Tula Nakayama, RN, BSN.         Prior Anticoagulation Instructions: INR 5.0  Skip today and tomorrow's  dose. Then continue taking 1 tablet everyday except 1 1/2 tablets on Monday. Recheck in 1 week.   Current Anticoagulation Instructions: INR 1.2 Today take 1.5mg s then resume 1mg s daily except 1.5mg s on Mondays. Recheck in one week.

## 2010-10-06 NOTE — Medication Information (Signed)
Summary: rov/eac  Anticoagulant Therapy  Managed by: Margaretha Sheffield, PharmD PCP: Charlynn Grimes in [place], [place] Supervising MD: Harrington Challenger MD, Nevin Bloodgood Indication 1: Atrial Fibrillation [place] Site: [place] INR POC 1.5 INR RANGE 2.0-3.0  Dietary changes: yes       Details: more greens than normal  Health status changes: no    Bleeding/hemorrhagic complications: no    Recent/future hospitalizations: no    Any changes in medication regimen? no    Recent/future dental: no  Any missed doses?: no       Is patient compliant with meds? yes       Allergies: 1)  ! Codeine 2)  ! Clonidine Hcl  Anticoagulation Management History:      The patient is taking warfarin and comes in today for a routine follow up visit.  Positive risk factors for bleeding include presence of serious comorbidities.  Negative risk factors for bleeding include an age less than 40 years old.  The bleeding index is 'intermediate risk'.  Positive CHADS2 values include History of HTN.  Negative CHADS2 values include Age > 93 years old.  Her last INR was 2.6.  Anticoagulation responsible provider: Harrington Challenger MD, Nevin Bloodgood.  INR POC: 1.5.  Cuvette Lot#: [id number].  Exp: 04/2011.    Anticoagulation Management Assessment/Plan:      The patient's current anticoagulation dose is Coumadin 1 mg tabs: Take as Directed..  The target INR is 2.0-3.0.  The next INR is due 02/06/2010.  Anticoagulation instructions were given to patient.  Results were reviewed/authorized by Margaretha Sheffield, PharmD.  She was notified by Margaretha Sheffield.         Prior Anticoagulation Instructions: INR 1.9  Take 2 tablets today.  Then return to taking 1 tablet daily.  Return to clinic in two weeks.     Current Anticoagulation Instructions: INR 1.5  Take 2 tablets today.  Then start NEW dosing schedule of 1.5 tablets on Monday and 1 tablet all other days.  Return to clinic in 2 weeks.

## 2010-10-06 NOTE — Medication Information (Signed)
Summary: ccn/ gd  Anticoagulant Therapy  Managed by: Gwynneth Albright, PharmD PCP: Charlynn Grimes in [place], [place] Supervising MD: Caryl Comes MD, Remo Lipps Indication 1: Atrial Fibrillation INR POC 5.0 INR RANGE 2.0-3.0  Dietary changes: yes       Details: Educated on appropriate diet.  Health status changes: yes       Details: Educated on signs/symptoms of stroke and AFib.  Bleeding/hemorrhagic complications: yes       Details: Educated on signs/symptoms of bleeding.  Recent/future hospitalizations: yes       Details: DC on 12/07/09 for AFib per patient.  Any changes in medication regimen? yes       Details: Educated on importance of notifying of med changes.  Recent/future dental: yes     Details: Educated on notifying of procedures.  Any missed doses?: yes     Details: Educated on compliance.  Is patient compliant with meds? yes      Comments: Per patient, DC from [facility] on Sunday evening.  Requesting medical records to be sent here.  Per pt, diagnosis is AFib with possibility of needing pacemaker.  She received 2 units of blood due to iron deficiency anemia.  No report of bleeding.  Patient will bring in medication list to next visit.  Allergies: No Known Drug Allergies  Anticoagulation Management History:      The patient comes in today for her initial visit for anticoagulation therapy.  Positive risk factors for bleeding include presence of serious comorbidities.  Negative risk factors for bleeding include an age less than 63 years old.  The bleeding index is 'intermediate risk'.  Positive CHADS2 values include History of HTN.  Negative CHADS2 values include Age > 44 years old.  Her last INR was 0.9 RATIO.  Anticoagulation responsible provider: Caryl Comes MD, Remo Lipps.  INR POC: 5.0.  Cuvette Lot#: [id number].  Exp: 01/2011.    Anticoagulation Management Assessment/Plan:      The patient's current anticoagulation dose is Coumadin 5 mg tabs: Take 1 tablet daily or as directed by  Coumadin Clinic..  The next INR is due 12/12/2009.  Results were reviewed/authorized by Gwynneth Albright, PharmD.  She was notified by Gwynneth Albright, PharmD.         Current Anticoagulation Instructions: INR 5.0. Hold Coumadin today and tomorrow, then take 1 tablet daily of the Coumadin 1 mg tablets.  Recheck on Friday.  Appended Document: Coumadin Clinic    Anticoagulant Therapy  Managed by: Gwynneth Albright, PharmD PCP: Charlynn Grimes in [place], [place] Supervising MD: Caryl Comes MD, Remo Lipps Indication 1: Atrial Fibrillation [place] Site: [place] INR RANGE 2.0-3.0           Allergies: No Known Drug Allergies  Anticoagulation Management History:      Positive risk factors for bleeding include presence of serious comorbidities.  Negative risk factors for bleeding include an age less than 103 years old.  The bleeding index is 'intermediate risk'.  Positive CHADS2 values include History of HTN.  Negative CHADS2 values include Age > 80 years old.  Her last INR was 0.9 RATIO.  Anticoagulation responsible provider: Caryl Comes MD, Remo Lipps.  Exp: 01/2011.    Anticoagulation Management Assessment/Plan:      The patient's current anticoagulation dose is Coumadin 5 mg tabs: Take 1 tablet daily or as directed by Coumadin Clinic..  The next INR is due 12/12/2009.  Results were reviewed/authorized by Gwynneth Albright, PharmD.         Prior Anticoagulation Instructions: INR 5.0. Hold Coumadin today and  tomorrow, then take 1 tablet daily of the Coumadin 1 mg tablets.  Recheck on Friday.

## 2010-10-06 NOTE — Medication Information (Signed)
Summary: rov/cb  Anticoagulant Therapy  Managed by: Gwynneth Albright, PharmD PCP: Charlynn Grimes in [place], [place] Supervising MD: Haroldine Laws MD, Quillian Quince Indication 1: Atrial Fibrillation [place] Site: [place] INR POC 2.6 INR RANGE 2.0-3.0  Dietary changes: no    Health status changes: no    Bleeding/hemorrhagic complications: no    Recent/future hospitalizations: no    Any changes in medication regimen? yes       Details: has added vitB, vitd(3), AND IRON TO MED LIST  Recent/future dental: no  Any missed doses?: no       Is patient compliant with meds? yes      Comments: PT STATES FELL DOWN TUES NOC---NO NOTED BRUISING AND PT STATES NO PROBLEMS  Current Medications (verified): 1)  Lisinopril-Hydrochlorothiazide 20-25 Mg Tabs (Lisinopril-Hydrochlorothiazide) .Marland Kitchen.. 1 By Mouth Daily 2)  Hydralazine Hcl 100 Mg Tabs (Hydralazine Hcl) .Marland Kitchen.. 1 By Mouth Twice Daily 3)  Spironolactone 25 Mg Tabs (Spironolactone) .... Take One Tablet By Mouth Daily 4)  Carvedilol 25 Mg Tabs (Carvedilol) .... Take One Tablet By Mouth Twice A Day 5)  Fenofibrate Micronized 134 Mg Caps (Fenofibrate Micronized) .Marland Kitchen.. 1 By Mouth At Night 6)  Omeprazole 20 Mg Cpdr (Omeprazole) .Marland Kitchen.. 1 By Mouth Daily 7)  Simvastatin 80 Mg Tabs (Simvastatin) .... Take One Tablet By Mouth Daily At Bedtime 8)  Amlodipine Besylate 10 Mg Tabs (Amlodipine Besylate) .... Take One Tablet By Mouth Daily 9)  Aspirin Ec 325 Mg Tbec (Aspirin) .... Take 3  Tablets By Mouth Daily ( Hold) 10)  Cyanocobalamin 1000 Mcg/ml Soln (Cyanocobalamin) .... Once A Mth 11)  Vitamin D3 1000 Unit Tabs (Cholecalciferol) .... Two Times A Day 12)  Multivitamins   Tabs (Multiple Vitamin) .... Once Daily 13)  Trazodone Hcl 50 Mg Tabs (Trazodone Hcl) .Marland Kitchen.. 1 -2 At Bedtime As Needed 14)  Wellbutrin Sr 150 Mg Xr12h-Tab (Bupropion Hcl) .... 2 in The Morning A 1 At Night 15)  Coumadin 5 Mg Tabs (Warfarin Sodium) .... Take 1 Tablet Daily or As Directed By Coumadin  Clinic. 16)  Citalopram Hydrobromide 20 Mg Tabs (Citalopram Hydrobromide) .... Once Daily  Allergies (verified): 1)  ! Codeine 2)  ! Clonidine Hcl  Anticoagulation Management History:      The patient is taking warfarin and comes in today for a routine follow up visit.  Positive risk factors for bleeding include presence of serious comorbidities.  Negative risk factors for bleeding include an age less than 34 years old.  The bleeding index is 'intermediate risk'.  Positive CHADS2 values include History of HTN.  Negative CHADS2 values include Age > 75 years old.  Her last INR was 0.9 RATIO and today's INR is 2.6.  Anticoagulation responsible provider: Sacha Topor MD, Quillian Quince.  INR POC: 2.6.  Cuvette Lot#: [id number].  Exp: 01/2011.    Anticoagulation Management Assessment/Plan:      The patient's current anticoagulation dose is Coumadin 5 mg tabs: Take 1 tablet daily or as directed by Coumadin Clinic..  The next INR is due 01/01/2010.  Results were reviewed/authorized by Gwynneth Albright, PharmD.  She was notified by nterbeck.         Prior Anticoagulation Instructions: INR 2.6. Take 1 tablet daily. Recheck on Thursday.  Current Anticoagulation Instructions: inr=2.6  will continue present dosing schedule--(1mg )--take 1 tablet everynight

## 2010-10-06 NOTE — Medication Information (Signed)
Summary: cvrr/tp  Anticoagulant Therapy  Managed by: Vanessa [place], PharmD Referring MD: Dr Haroldine Laws PCP: Bernerd Limbo, [facility] [place] MD: Angelena Form MD, Harrell Gave Indication 1: Atrial Fibrillation [place] Site: [place] INR POC 2.0 INR RANGE 2.0-3.0  Dietary changes: no    Health status changes: no    Bleeding/hemorrhagic complications: no    Recent/future hospitalizations: no    Any changes in medication regimen? no    Recent/future dental: no  Any missed doses?: no       Is patient compliant with meds? yes       Allergies: 1)  ! Codeine 2)  ! Clonidine Hcl  Anticoagulation Management History:      The patient is taking warfarin and comes in today for a routine follow up visit.  Positive risk factors for bleeding include presence of serious comorbidities.  Negative risk factors for bleeding include an age less than 4 years old.  The bleeding index is 'intermediate risk'.  Positive CHADS2 values include History of HTN.  Negative CHADS2 values include Age > 31 years old.  Her last INR was 2.6.  Anticoagulation responsible provider: Angelena Form MD, Harrell Gave.  INR POC: 2.0.  Cuvette Lot#: [id number].  Exp: 06/2011.    Anticoagulation Management Assessment/Plan:      The patient's current anticoagulation dose is Coumadin 1 mg tabs: Take as Directed..  The target INR is 2.0-3.0.  The next INR is due 04/27/2010.  Anticoagulation instructions were given to patient.  Results were reviewed/authorized by Vanessa Morrison, PharmD.         Prior Anticoagulation Instructions: INR 1.2  Take 2 tabs today and tomorrow and then resume normal dose of 1 tab daily except for 1.5 tabs on Monday.  Re-check in 1 week.  Current Anticoagulation Instructions: INR:  2.0  Continue Coumadin 1 1/2 tablet on Monday and 1 tablet other days of the week.

## 2010-10-06 NOTE — Progress Notes (Signed)
  Faxed ROI over to [facility] Records back forwarded to [facility] for [place] 4/7. Joelene Millin Mesiemore  December 09, 2009 1:07 PM

## 2010-10-06 NOTE — Medication Information (Signed)
Summary: coumadin ck/mt  Anticoagulant Therapy  Managed by: Porfirio Oar, PharmD Referring MD: Dr Haroldine Laws PCP: Bernerd Limbo, [facility] [place] MD: Burt Knack MD, Legrand Como Indication 1: Atrial Fibrillation Lab Used: [facility] [place] Site: [place] INR POC 5.5 INR RANGE 2.0-3.0  Dietary changes: no    Health status changes: no    Bleeding/hemorrhagic complications: no    Recent/future hospitalizations: no    Any changes in medication regimen? yes       Details: off simvastatin for about 1 1/2 weeks.   Recent/future dental: no  Any missed doses?: no       Is patient compliant with meds? yes       Allergies: 1)  ! Codeine 2)  ! Clonidine Hcl  Anticoagulation Management History:      The patient is taking warfarin and comes in today for a routine follow up visit.  Positive risk factors for bleeding include presence of serious comorbidities.  Negative risk factors for bleeding include an age less than 61 years old.  The bleeding index is 'intermediate risk'.  Positive CHADS2 values include History of HTN.  Negative CHADS2 values include Age > 90 years old.  Her last INR was 2.6.  Anticoagulation responsible provider: Burt Knack MD, Legrand Como.  INR POC: 5.5.  Cuvette Lot#: [id number].  Exp: 08/2011.    Anticoagulation Management Assessment/Plan:      The patient's current anticoagulation dose is Coumadin 1 mg tabs: Take as Directed..  The target INR is 2.0-3.0.  The next INR is due 08/14/2010.  Anticoagulation instructions were given to patient.  Results were reviewed/authorized by Porfirio Oar, PharmD.  She was notified by Porfirio Oar PharmD.         Prior Anticoagulation Instructions: INR 1.9 Take 1.5 tablet today then continue previous dose of 1 tablet everyday except 1.5 tablet on Monday Recheck INR in 10 days  Current Anticoagulation Instructions: INR 5.5  Skip today and tomorrows dose of Coumadin then decrease dose to 1 tablet every  day.  Recheck INR in 1 week.

## 2010-10-06 NOTE — Progress Notes (Signed)
  Phone Note Call from Patient   Caller: Patient Call For: Coumadin Clinic Summary of Call: Patient had a minor accident on the evening of her last coumadin follow-up (at which time her INR was 5.0).  She begain noticing dark stools the following day and went to [facility] ED.  She was transfused and given vitamin D and was found to have GI bleed.  Bleed was cauterized and patient was instructed to remain off coumadin for 2 weeks post discharge.  Pt was discharged  Wednesday, 10/19.  Hospitalist instructed pt to see PCP in 1 week, and gastroenterologist in 1 month.  Prior to bleed, patient was on 1 tablet everyday, except 1.5 tablets on Monday.  I have instructed patient to resume coumadin at regular dose on Thursday November 3rd.  We will follow-up in 1 week.  I will notify Dr. Haroldine Laws of her recent hospitalization.     Initial call taken by: Margaretha Sheffield,  June 25, 2010 1:51 PM

## 2010-10-06 NOTE — Progress Notes (Signed)
Summary: monitor results  Phone Note Outgoing Call   Call placed by: Kevan Rosebush, RN,  July 24, 2010 3:20 PM Summary of Call: called pt w/monitor results: SR w/occ. brief (<5 beats) SVT per Dr Haroldine Laws, pt is aware.  She also states that she can not afford Lipitor that was called in, will put pt on pravastatin 80mg  for now and switch to Lipitor when it is available generic    New/Updated Medications: PRAVASTATIN SODIUM 80 MG TABS (PRAVASTATIN SODIUM) Take 1 tablet by mouth once a day Prescriptions: PRAVASTATIN SODIUM 80 MG TABS (PRAVASTATIN SODIUM) Take 1 tablet by mouth once a day  #30 x 6   Entered by:   Kevan Rosebush, RN   Authorized by:   Jolaine Artist, MD, [facility]   Signed by:   Kevan Rosebush, RN on 07/24/2010   Method used:   Electronically to        [facility] [phone]* (retail)       [address]       [place], [place]  [address]       Ph: [phone]       Fax: [phone]   [id number]   [phone]   Appended Document: consult for possible osa Rhonda, please stay on the pt about getting this study.Marland Kitchenlet her know that osa can worsen afib.

## 2010-10-06 NOTE — Medication Information (Signed)
Summary: rov/tm  Anticoagulant Therapy  Managed by: Porfirio Oar, PharmD Referring MD: Dr Haroldine Laws PCP: Bernerd Limbo, [facility] [place] MD: Ron Parker MD, Dellis Filbert Indication 1: Atrial Fibrillation Lab Used: [facility] [place] Site: [place] INR RANGE 2.0-3.0  Dietary changes: no    Health status changes: no    Bleeding/hemorrhagic complications: no    Recent/future hospitalizations: no      Any missed doses?: yes     Details: Missed Saturday dose, took Saturday dose Sunday AM & 1/2 Sunday dose at night  Is patient compliant with meds? yes       Allergies: 1)  ! Codeine 2)  ! Clonidine Hcl  Anticoagulation Management History:      The patient is taking warfarin and comes in today for a routine follow up visit.  Positive risk factors for bleeding include presence of serious comorbidities.  Negative risk factors for bleeding include an age less than 14 years old.  The bleeding index is 'intermediate risk'.  Positive CHADS2 values include History of HTN.  Negative CHADS2 values include Age > 62 years old.  Her last INR was 2.6.  Anticoagulation responsible provider: Ron Parker MD, Dellis Filbert.  Cuvette Lot#: [id number].  Exp: 07/2011.    Anticoagulation Management Assessment/Plan:      The patient's current anticoagulation dose is Coumadin 1 mg tabs: Take as Directed..  The target INR is 2.0-3.0.  The next INR is due 08/03/2010.  Anticoagulation instructions were given to patient.  Results were reviewed/authorized by Porfirio Oar, PharmD.  She was notified by Carmin Richmond, PharmD Candidate.         Prior Anticoagulation Instructions: INR 1.2 Today take 1.5mg s then resume 1mg s daily except 1.5mg s on Mondays. Recheck in one week.   Current Anticoagulation Instructions: INR 1.9 Take 1.5 tablet today then continue previous dose of 1 tablet everyday except 1.5 tablet on Monday Recheck INR in 10 days

## 2010-10-06 NOTE — Progress Notes (Signed)
Summary: re appt for eph  Phone Note Call from Patient   Caller: Patient [phone] Reason for Call: Talk to Nurse, Talk to Doctor Summary of Call: pt calling to set up a post hospital visit from [facility], was told to fu with cardiology, Saifullah Jolley booked until 12-9, offered [facility], she said what ever i think is best, told her what [facility] had available, she laughed and she stated we can't make room for people leaving the hospital?, told her [facility] had plenty of room, she then stated she was told to follow up with her cardiologist, told her [facility] is trained in cardiology, again she states what ever i think is best, offered again time with [facility], she laughed again, offered her a call from the nurse to see what she would think is best Initial call taken by: Lorenda Hatchet,  July 03, 2010 11:29 AM  Follow-up for Phone Call        Left message to call back Kevan Rosebush, RN  July 03, 2010 2:46 PM   spoke w/pt she was d/c'd from [facility] last Tue adn wants to see Dr Angie Hogg to discuss her a-fib she has been off her coumadin for a GI bleed and is suppose to start back later this week and is having a virtal CT instead of colonoscopy, appt sch for 11/9 at 10:15 Kevan Rosebush, RN  July 07, 2010 12:28 PM

## 2010-10-06 NOTE — Assessment & Plan Note (Signed)
Summary: rov   Visit Type:  Follow-up Primary Provider:  Bernerd Limbo, [facility]  CC:  chest pain & SOB at times.  History of Present Illness: [name] is a 60 y/o woman with severe vascular disease follwoed by Dr. [name], COPD, HTN, CAD with borderline LAD lesion, PAF and markedly abnormal ECG with persistent severe T wave inversions. Returns for routine f/u.  Returns for routine f/u. Has had a rough time lately.  Recently admitted to [facility] with GIB in setting of INR of 5. Treated with vitamin K. Did EGD with cautery. Gout 2 u PRBCs and FFP. Then 2 weeks ago readmitted to [facility] with AF with RVR in 130s associated with dyspnea. No CP. Got another 2 units of RBCs. AF seems to be precipated by stress. Snores heavily.   Started back on coumadin last week. No recurrent bleeding. Denies AF since discharge but notices several times a day is having brief flutterings. Other than that feels she is doing OK. Able to do all her daily activities without change.   No longer smoking. Quit 6/30 at 5pm. Saw Dr. Shellia Cleverly on 8/23 and had ABIs, carotid u/s and ab u/s. Also saw Dr. Coletta Memos who is following her cholesterol and kidney function. Most recent CR up slightly at 2.0. Supposed to make an appt with renal.    Current Medications (verified): 1)  Lisinopril-Hydrochlorothiazide 20-25 Mg Tabs (Lisinopril-Hydrochlorothiazide) .Marland Kitchen.. 1 By Mouth Daily 2)  Hydralazine Hcl 100 Mg Tabs (Hydralazine Hcl) .Marland Kitchen.. 1 By Mouth Twice Daily 3)  Spironolactone 25 Mg Tabs (Spironolactone) .... Take One Tablet By Mouth Daily 4)  Carvedilol 25 Mg Tabs (Carvedilol) .... Take One Tablet By Mouth Twice A Day 5)  Fenofibrate Micronized 134 Mg Caps (Fenofibrate Micronized) .Marland Kitchen.. 1 By Mouth At Night 6)  Omeprazole 20 Mg Cpdr (Omeprazole) .Marland Kitchen.. 1 By Mouth Two Times A Day 7)  Simvastatin 80 Mg Tabs (Simvastatin) .... Take One Tablet By Mouth Daily At Bedtime 8)  Amlodipine Besylate 10 Mg Tabs (Amlodipine Besylate)  .... Take One Tablet By Mouth Daily 9)  Trazodone Hcl 50 Mg Tabs (Trazodone Hcl) .Marland Kitchen.. 1 -2 At Bedtime As Needed 10)  Citalopram Hydrobromide 10 Mg Tabs (Citalopram Hydrobromide) .... Once Daily 11)  Coumadin 1 Mg Tabs (Warfarin Sodium) .... Take As Directed. 12)  Cardura 1 Mg Tabs (Doxazosin Mesylate) .... Take 1 By Mouth Once Daily 13)  Alprazolam Xr 0.5 Mg Xr24h-Tab (Alprazolam) .... As Needed  (Not Yet)  Allergies (verified): 1)  ! Codeine 2)  ! Clonidine Hcl  Past History:  Past Medical History:  1. CAD      a. cath 5/09 heavily calcified vessels. LAD 60,15m, 90 distal, LCX 60, 40 RCA 40s. normal EF            -->medical Rx  2. Severe PAD      a. s/p bilateral renal artery stents. moderate in-stent restenosis      b. AAA (3.7 x 3.3) by u/s 5/09  3. Carotid stenosis      a. s/p L CEA 5/09  4. Hypertension.   5. Hyperlipidemia.   6. COPD with ongoing tobacco use   7. GERD with hiatal hernia.   8. DM2 9. Paroxysmal AF 10. GIB s/p EGD with cautery in 9/11 at [facility]  Review of Systems       As per HPI and past medical history; otherwise all systems negative.   Vital Signs:  Patient profile:   60 year old female  Height:      65 inches Weight:      153 pounds BMI:     25.55 Pulse rate:   58 / minute BP sitting:   164 / 78  (left arm) Cuff size:   regular  Vitals Entered By: Mignon Pine, RMA (July 15, 2010 10:32 AM)  Physical Exam  General:  Well appearing. no resp difficulty HEENT: normal Neck: supple. no JVD. Carotids 2+ bilat; Left CEA. + L bruit. No lymphadenopathy or thryomegaly appreciated. Cor: PMI nondisplaced. Regular rate & rhythm. No rubs, gallops, murmur. Lungs: clear Abdomen: soft, nontender, nondistended. No hepatosplenomegaly. No bruits or masses. Good bowel sounds. Extremities: no cyanosis, clubbing, rash. trivial ankle edema on R. DP trace L, 2+R  no sores Neuro: alert & orientedx3, cranial nerves grossly intact. moves all 4 extremities w/o  difficulty. affect pleasant    Impression & Recommendations:  Problem # 1:  ATRIAL FIBRILLATION (ICD-427.31) Discussed rate control vs rhythm control strategies. Will place 48 hour holter to assess AF burden. If burden high and associated with RVR will consider anti-arrhythmic rx with amiodarone (not good candidate for Tikosyn or Sotalol due to renal insufficiency). Refer for sleep study as untreated OSA will worsen AF. Watch INR closely. Level 2.0-2.5. Check BMET and CBC today.  Problem # 2:  CAD, NATIVE VESSEL (ICD-414.01) Stable. No evidence of ischemia. Continue current regimen.  Problem # 3:  HYPERTENSION, BENIGN (ICD-401.1) Blood pressure well controlled. Continue current regimen.  Problem # 4:  Renal insufficiency Refer to renal  Other Orders: EKG w/ Interpretation (93000) Pulmonary Referral (Pulmonary) Holter (Holter) Nephrology Referral (Nephro)  Patient Instructions: 1)  Your physician recommends that you schedule a follow-up appointment in: 4 months 2)  Your physician recommends that you return for lab work [id number] 3)  You have been referred to nephrology and pulmonary 4)  Your physician has recommended that you wear a holter monitor.  Holter monitors are medical devices that record the heart's electrical activity. Doctors most often use these monitors to diagnose arrhythmias. Arrhythmias are problems with the speed or rhythm of the heartbeat. The monitor is a small, portable device. You can wear one while you do your normal daily activities. This is usually used to diagnose what is causing palpitations/syncope (passing out). 5)  Your physician has recommended you make the following change in your medication: Stop Simvastatin and start Lipitor 80mg  Prescriptions: LIPITOR 80 MG TABS (ATORVASTATIN CALCIUM) Take one tablet by mouth at bedtime  #30 x 6   Entered by:   Rhett Bannister  LPN   Authorized by:   Jolaine Artist, MD, [facility]   Signed by:   Rhett Bannister  LPN  on [id number]   Method used:   Electronically to        [facility] [phone]* (retail)       [place], [place]  [address]       Ph: [phone]       Fax: [phone]   RxID:   [id number]

## 2010-10-06 NOTE — Medication Information (Signed)
Summary: rov/ewj  Anticoagulant Therapy  Managed by: Porfirio Oar, PharmD Referring MD: Dr Haroldine Laws PCP: Charlynn Grimes in [place], [place] MD: Haroldine Laws MD, Quillian Quince Indication 1: Atrial Fibrillation [place] Site: [place] INR POC 2.9 INR RANGE 2.0-3.0  Dietary changes: no    Health status changes: no    Bleeding/hemorrhagic complications: no    Recent/future hospitalizations: no    Any changes in medication regimen? no    Recent/future dental: no  Any missed doses?: no       Is patient compliant with meds? yes       Allergies: 1)  ! Codeine 2)  ! Clonidine Hcl  Anticoagulation Management History:      The patient is taking warfarin and comes in today for a routine follow up visit.  Positive risk factors for bleeding include presence of serious comorbidities.  Negative risk factors for bleeding include an age less than 36 years old.  The bleeding index is 'intermediate risk'.  Positive CHADS2 values include History of HTN.  Negative CHADS2 values include Age > 25 years old.  Her last INR was 2.6.  Anticoagulation responsible provider: Naturi Alarid MD, Quillian Quince.  INR POC: 2.9.  Cuvette Lot#: [id number].  Exp: 05/2011.    Anticoagulation Management Assessment/Plan:      The patient's current anticoagulation dose is Coumadin 1 mg tabs: Take as Directed..  The target INR is 2.0-3.0.  The next INR is due 03/31/2010.  Anticoagulation instructions were given to patient.  Results were reviewed/authorized by Porfirio Oar, PharmD.  She was notified by Porfirio Oar PharmD.         Prior Anticoagulation Instructions: INR 2.3  Continue on same dosage 1 tablet daily except 1.5 tablets on Mondays.  Recheck in 3 weeks.    Current Anticoagulation Instructions: INR 2.9  Continue same dose of 1 tablet every day except 1 1/2 tablets on Monday.

## 2010-10-06 NOTE — Medication Information (Signed)
Summary: rov --coumadin clinic  Anticoagulant Therapy  Managed by: Porfirio Oar, PharmD PCP: Charlynn Grimes in [place], [place] Supervising MD: Aundra Dubin MD, Kirk Ruths Indication 1: Atrial Fibrillation [place] Site: [place] INR POC 2.4 INR RANGE 2.0-3.0  Dietary changes: no    Health status changes: no    Bleeding/hemorrhagic complications: no    Recent/future hospitalizations: no    Any changes in medication regimen? no    Recent/future dental: no  Any missed doses?: no       Is patient compliant with meds? yes       Allergies: 1)  ! Codeine 2)  ! Clonidine Hcl  Anticoagulation Management History:      The patient is taking warfarin and comes in today for a routine follow up visit.  Positive risk factors for bleeding include presence of serious comorbidities.  Negative risk factors for bleeding include an age less than 35 years old.  The bleeding index is 'intermediate risk'.  Positive CHADS2 values include History of HTN.  Negative CHADS2 values include Age > 52 years old.  Her last INR was 2.6.  Anticoagulation responsible provider: Aundra Dubin MD, Emika Tiano.  INR POC: 2.4.  Cuvette Lot#: [id number].  Exp: 01/2011.    Anticoagulation Management Assessment/Plan:      The patient's current anticoagulation dose is Coumadin 5 mg tabs: Take 1 tablet daily or as directed by Coumadin Clinic..  The next INR is due 01/07/2010.  Anticoagulation instructions were given to patient.  Results were reviewed/authorized by Porfirio Oar, PharmD.  She was notified by Porfirio Oar PharmD.         Prior Anticoagulation Instructions: inr=2.6  will continue present dosing schedule--(1mg )--take 1 tablet everynight  Current Anticoagulation Instructions: INR 2.4  Continue same dose of 1 mg daily

## 2010-10-06 NOTE — Medication Information (Signed)
Summary: rov/sp  Anticoagulant Therapy  Managed by: Porfirio Oar, PharmD Referring MD: Dr Haroldine Laws PCP: Bernerd Limbo, [facility] MD: Olevia Perches MD, Quantay Zaremba Indication 1: Atrial Fibrillation [place] Site: [place] INR POC 1.2 INR RANGE 2.0-3.0  Dietary changes: no    Health status changes: yes       Details: pt stopped smoking at end of June  Bleeding/hemorrhagic complications: no    Recent/future hospitalizations: no    Any changes in medication regimen? no    Recent/future dental: no  Any missed doses?: no       Is patient compliant with meds? yes       Allergies: 1)  ! Codeine 2)  ! Clonidine Hcl  Anticoagulation Management History:      The patient is taking warfarin and comes in today for a routine follow up visit.  Positive risk factors for bleeding include presence of serious comorbidities.  Negative risk factors for bleeding include an age less than 72 years old.  The bleeding index is 'intermediate risk'.  Positive CHADS2 values include History of HTN.  Negative CHADS2 values include Age > 32 years old.  Her last INR was 2.6.  Anticoagulation responsible provider: Olevia Perches MD, Darnell Level.  INR POC: 1.2.  Cuvette Lot#: [id number].  Exp: 06/2011.    Anticoagulation Management Assessment/Plan:      The patient's current anticoagulation dose is Coumadin 1 mg tabs: Take as Directed..  The target INR is 2.0-3.0.  The next INR is due 04/07/2010.  Anticoagulation instructions were given to patient.  Results were reviewed/authorized by Porfirio Oar, PharmD.  She was notified by Lind Covert.         Prior Anticoagulation Instructions: INR 2.9  Continue same dose of 1 tablet every day except 1 1/2 tablets on Monday.   Current Anticoagulation Instructions: INR 1.2  Take 2 tabs today and tomorrow and then resume normal dose of 1 tab daily except for 1.5 tabs on Monday.  Re-check in 1 week.

## 2010-10-06 NOTE — Miscellaneous (Signed)
  Clinical Lists Changes  Medications: Added new medication of COUMADIN 1 MG TABS (WARFARIN SODIUM) Take as Directed. - Signed Removed medication of COUMADIN 5 MG TABS (WARFARIN SODIUM) Take 1 tablet daily or as directed by Coumadin Clinic. Rx of COUMADIN 1 MG TABS (WARFARIN SODIUM) Take as Directed.;  #45 x 1;  Signed;  Entered by: Margaretha Sheffield;  Authorized by: Annye Rusk, MD, [facility];  Method used: Electronically to [facility] [phone]*, [address] [place], [place]  [address], Ph: [phone], Fax: [phone]    Prescriptions: COUMADIN 1 MG TABS (WARFARIN SODIUM) Take as Directed.  #45 x 1   Entered by:   Margaretha Sheffield   Authorized by:   Annye Rusk, MD, [facility]   Signed by:   Margaretha Sheffield on 01/09/2010   Method used:   Electronically to        [facility] [phone]* (retail)       [place], [place]  [address]       Ph: [phone]       Fax: [phone]   RxID:   [id number]

## 2010-10-06 NOTE — Medication Information (Signed)
Summary: rov/eac  Anticoagulant Therapy  Managed by: Freddrick March, RN, BSN Referring MD: Dr Haroldine Laws PCP: Charlynn Grimes in [place], [place] MD: Harrington Challenger MD, Nevin Bloodgood Indication 1: Atrial Fibrillation [name] INR POC 2.3 INR RANGE 2.0-3.0  Dietary changes: no    Health status changes: yes       Details: Seeing ortho MD for trigger finger, may possibly have surgery.    Bleeding/hemorrhagic complications: no    Recent/future hospitalizations: no    Any changes in medication regimen? no    Recent/future dental: no  Any missed doses?: no       Is patient compliant with meds? yes       Allergies: 1)  ! Codeine 2)  ! Clonidine Hcl  Anticoagulation Management History:      The patient is taking warfarin and comes in today for a routine follow up visit.  Positive risk factors for bleeding include presence of serious comorbidities.  Negative risk factors for bleeding include an age less than 89 years old.  The bleeding index is 'intermediate risk'.  Positive CHADS2 values include History of HTN.  Negative CHADS2 values include Age > 23 years old.  Her last INR was 2.6.  Anticoagulation responsible provider: Harrington Challenger MD, Nevin Bloodgood.  INR POC: 2.3.  Cuvette Lot#: [id number].  Exp: 04/2011.    Anticoagulation Management Assessment/Plan:      The patient's current anticoagulation dose is Coumadin 1 mg tabs: Take as Directed..  The target INR is 2.0-3.0.  The next INR is due 02/27/2010.  Anticoagulation instructions were given to patient.  Results were reviewed/authorized by Freddrick March, RN, BSN.  She was notified by Porfirio Oar PharmD.         Prior Anticoagulation Instructions: INR 1.5  Take 2 tablets today.  Then start NEW dosing schedule of 1.5 tablets on Monday and 1 tablet all other days.  Return to clinic in 2 weeks.    Current Anticoagulation Instructions: INR 2.3  Continue on same dosage 1 tablet daily except 1.5 tablets on Mondays.  Recheck in 3 weeks.

## 2010-10-06 NOTE — Medication Information (Signed)
Summary: rov/ewj  Anticoagulant Therapy  Managed by: Porfirio Oar, PharmD Referring MD: Dr Haroldine Laws PCP: Bernerd Limbo, [facility] [place] MD: Lovena Le MD, Carleene Overlie Indication 1: Atrial Fibrillation  Site: [place] INR POC 5.0 INR RANGE 2.0-3.0  Dietary changes: no    Health status changes: no    Bleeding/hemorrhagic complications: no    Recent/future hospitalizations: no    Any changes in medication regimen? no    Recent/future dental: no  Any missed doses?: no       Is patient compliant with meds? yes      Comments: Had GI problems, diarrhea  Allergies: 1)  ! Codeine 2)  ! Clonidine Hcl  Anticoagulation Management History:      The patient is taking warfarin and comes in today for a routine follow up visit.  Positive risk factors for bleeding include presence of serious comorbidities.  Negative risk factors for bleeding include an age less than 28 years old.  The bleeding index is 'intermediate risk'.  Positive CHADS2 values include History of HTN.  Negative CHADS2 values include Age > 23 years old.  Her last INR was 2.6.  Anticoagulation responsible provider: Lovena Le MD, Carleene Overlie.  INR POC: 5.0.  Cuvette Lot#: [id number].  Exp: 07/2011.    Anticoagulation Management Assessment/Plan:      The patient's current anticoagulation dose is Coumadin 1 mg tabs: Take as Directed..  The target INR is 2.0-3.0.  The next INR is due 06/25/2010.  Anticoagulation instructions were given to patient.  Results were reviewed/authorized by Porfirio Oar, PharmD.  She was notified by Griffith Citron D candidate.         Prior Anticoagulation Instructions: INR 2.4  Continue on same dosage 1 tablet daily except 1.5 tablets on Mondays.  Recheck in 4 weeks.    Current Anticoagulation Instructions: INR 5.0  Skip today and tomorrow's dose. Then continue taking 1 tablet everyday except 1 1/2 tablets on Monday. Recheck in 1 week.

## 2010-10-06 NOTE — Assessment & Plan Note (Signed)
Summary: per check out/sf   Primary Provider:  Bernerd Limbo, [facility]; 'Positional flutter feeling' in the evenings.  History of Present Illness: Trellis is a 60 y/o woman with severe vascular disease, COPD with ongoing tobacco use, HTN, CAD with borderline LAD lesion and PAF.  Returns for routine f/u.  Returns for routine f/u. Overall doing ok. Has been having some brief flutterings in her chest. Three or four times per week. They last just a few seconds. Nothing sustained. No syncope or presyncope. No CP or SOB. Still needs to arrange appt with Dr. [name] to follow up with her vascular disease. No TIA signs. No bleeding with coumadin.   Taking BPs at home fairly regularly. SBP 120-160. Average about 140. Likes to eat salt.  Still smoking about 4-5 cigs/day trying hard to quit. Dr. Coletta Memos following her cholesterol and kidney function. Most recent CR up slightly at 1.9.   Current Medications (verified): 1)  Lisinopril-Hydrochlorothiazide 20-25 Mg Tabs (Lisinopril-Hydrochlorothiazide) .Marland Kitchen.. 1 By Mouth Daily 2)  Hydralazine Hcl 100 Mg Tabs (Hydralazine Hcl) .Marland Kitchen.. 1 By Mouth Twice Daily 3)  Spironolactone 25 Mg Tabs (Spironolactone) .... Take One Tablet By Mouth Daily 4)  Carvedilol 25 Mg Tabs (Carvedilol) .... Take One Tablet By Mouth Twice A Day 5)  Fenofibrate Micronized 134 Mg Caps (Fenofibrate Micronized) .Marland Kitchen.. 1 By Mouth At Night 6)  Omeprazole 20 Mg Cpdr (Omeprazole) .Marland Kitchen.. 1 By Mouth Daily 7)  Simvastatin 80 Mg Tabs (Simvastatin) .... Take One Tablet By Mouth Daily At Bedtime 8)  Amlodipine Besylate 10 Mg Tabs (Amlodipine Besylate) .... Take One Tablet By Mouth Daily 9)  Cyanocobalamin 1000 Mcg/ml Soln (Cyanocobalamin) .... Once A Mth 10)  Vitamin D3 1000 Unit Tabs (Cholecalciferol) .... Two Times A Day 11)  Multivitamins   Tabs (Multiple Vitamin) .... Once Daily 12)  Trazodone Hcl 50 Mg Tabs (Trazodone Hcl) .Marland Kitchen.. 1 -2 At Bedtime As Needed 13)  Wellbutrin Sr  150 Mg Xr12h-Tab (Bupropion Hcl) .... 2 in The Morning A 1 At Night 14)  Citalopram Hydrobromide 20 Mg Tabs (Citalopram Hydrobromide) .... Once Daily 15)  Coumadin 1 Mg Tabs (Warfarin Sodium) .... Take As Directed.  Allergies: 1)  ! Codeine 2)  ! Clonidine Hcl  Past History:  Past Medical History: Last updated: 12/11/2009  1. CAD      a. cath 5/09 heavily calcified vessels. LAD 60,32m, 90 distal, LCX 60, 40 RCA 40s. normal EF            -->medical Rx  2. Severe PAD      a. s/p bilateral renal artery stents. moderate in-stent restenosis      b. AAA (3.7 x 3.3) by u/s 5/09  3. Carotid stenosis      a. s/p L CEA 5/09  4. Hypertension.   5. Hyperlipidemia.   6. COPD with ongoing tobacco use   7. GERD with hiatal hernia.   8. DM2 9. Paroxysmal AF  Review of Systems       As per HPI and past medical history; otherwise all systems negative.   Vital Signs:  Patient profile:   60 year old female Height:      65 inches Weight:      148 pounds BMI:     24.72 Pulse rate:   60 / minute Pulse rhythm:   regular BP sitting:   158 / 62  (right arm) Cuff size:   regular  Vitals Entered By: Eliezer Lofts, EMT-P (  March 03, 2010 2:09 PM)  Physical Exam  General:  Gen: well appearing. no resp difficulty HEENT: normal Neck: supple. no JVD. Carotids 2+ bilat; Left CEA. + L bruit. No lymphadenopathy or thryomegaly appreciated. Cor: PMI nondisplaced. Regular rate & rhythm. No rubs, gallops, murmur. Lungs: clear Abdomen: soft, nontender, nondistended. No hepatosplenomegaly. No bruits or masses. Good bowel sounds. Extremities: no cyanosis, clubbing, rash. trivial ankle edema on R. DP trace L, 2+R  no sores Neuro: alert & orientedx3, cranial nerves grossly intact. moves all 4 extremities w/o difficulty. affect pleasant    Impression & Recommendations:  Problem # 1:  CAD, NATIVE VESSEL (ICD-414.01) Stable. No evidence of ischemia. Continue current regimen.  Problem # 2:  HYPERTENSION,  BENIGN (ICD-401.1) BP moderately up. Will add Cardura 1mg  daily.   Problem # 3:  ATRIAL FIBRILLATION (ICD-427.31) Quiescent. Continue coumadin. If palpitations become more prominent or last longer will check monitor.  Problem # 4:  PVD (ICD-443.9) Needs to f/u with Dr. [name] for f/u on AAA and carotid disease.   Problem # 5:  TOBACCO ABUSE (ICD-305.1) Counseled on need to quit.   Patient Instructions: 1)  Your physician recommends that you schedule a follow-up appointment in: 6 months with Dr. Keturah Barre. Bensimhon 2)  Your physician has recommended you make the following change in your medication: Add Cardura 1 mg everyday. 3)  Follow-up with Dr. [name]. 4)  Stop smoking. Prescriptions: CARDURA 1 MG TABS (DOXAZOSIN MESYLATE) Take 1 by mouth once daily  #30 x 6   Entered by:   Eliezer Lofts, EMT-P   Authorized by:   Jolaine Artist, MD, [facility]   Signed by:   Jolaine Artist, MD, [facility] [address] on 03/03/2010   Method used:   Electronically to        [facility] [phone]* (retail)       [place], [place]  [address]       Ph: [phone]       Fax: [phone]   RxID:   [id number]   Appended Document: per check out/sf Patient on simva 80 and amlodipine 10. Based on recent FDA Black Box warning this is contraindicated. Unable to afford lipitor or crestor. Would suggest changing simva  to lipitor 80 in November when it becomes generic. (can continue same regimen for now as she has been on for quite some time without problem).

## 2010-10-06 NOTE — Progress Notes (Signed)
Summary: Question about taking tylenol  Phone Note Call from Patient Call back at Home Phone [phone]   Caller: Patient Summary of Call: Pt have question about taking Tylenol with Coumadin medication Initial call taken by: Delsa Sale,  December 10, 2009 2:33 PM  Follow-up for Phone Call        Spoke with pt. and gave her our coumadin and Tylenol suggestion. Follow-up by: Tula Nakayama, RN, BSN,  December 10, 2009 2:42 PM

## 2010-10-06 NOTE — Progress Notes (Signed)
  Phone Note Outgoing Call   Call placed by: Susette Racer,  July 17, 2010 12:04 PM Action Taken: Appt scheduled Summary of Call: pt has appt for monitor today Initial call taken by: Susette Racer,  July 17, 2010 12:04 PM

## 2010-10-06 NOTE — Medication Information (Signed)
Summary: ccr/jss  Anticoagulant Therapy  Managed by: Margaretha Sheffield, PharmD PCP: Charlynn Grimes in [place], [place] Supervising MD: Harrington Challenger MD, Nevin Bloodgood Indication 1: Atrial Fibrillation [place] Site: [place] INR POC 1.9 INR RANGE 2.0-3.0  Dietary changes: no    Health status changes: no    Bleeding/hemorrhagic complications: yes       Details: pt has one episode of blood in stool  Recent/future hospitalizations: no    Any changes in medication regimen? no    Recent/future dental: no  Any missed doses?: no       Is patient compliant with meds? yes       Allergies: 1)  ! Codeine 2)  ! Clonidine Hcl  Anticoagulation Management History:      The patient is taking warfarin and comes in today for a routine follow up visit.  Positive risk factors for bleeding include presence of serious comorbidities.  Negative risk factors for bleeding include an age less than 10 years old.  The bleeding index is 'intermediate risk'.  Positive CHADS2 values include History of HTN.  Negative CHADS2 values include Age > 89 years old.  Her last INR was 2.6.  Anticoagulation responsible provider: Harrington Challenger MD, Nevin Bloodgood.  INR POC: 1.9.  Cuvette Lot#: [id number].  Exp: 02/2011.    Anticoagulation Management Assessment/Plan:      The patient's current anticoagulation dose is Coumadin 5 mg tabs: Take 1 tablet daily or as directed by Coumadin Clinic..  The target INR is 2.0-3.0.  The next INR is due 01/23/2010.  Anticoagulation instructions were given to patient.  Results were reviewed/authorized by Margaretha Sheffield, PharmD.  She was notified by Margaretha Sheffield.         Prior Anticoagulation Instructions: INR 2.4  Continue same dose of 1 mg daily   Current Anticoagulation Instructions: INR 1.9  Take 2 tablets today.  Then return to taking 1 tablet daily.  Return to clinic in two weeks.     Appended Document: ccr/jss Pt was given Coumadin 1 mg #2 tabs for use until she can get prescription filled.  Lot: [id number]   Exp: Nov 2011

## 2010-10-07 ENCOUNTER — Encounter: Payer: Self-pay | Admitting: Internal Medicine

## 2010-10-07 ENCOUNTER — Encounter (INDEPENDENT_AMBULATORY_CARE_PROVIDER_SITE_OTHER): Payer: Medicare Other

## 2010-10-07 DIAGNOSIS — Z7901 Long term (current) use of anticoagulants: Secondary | ICD-10-CM

## 2010-10-07 DIAGNOSIS — I4891 Unspecified atrial fibrillation: Secondary | ICD-10-CM

## 2010-10-08 NOTE — Medication Information (Signed)
Summary: rov/tm  Anticoagulant Therapy  Managed by: Porfirio Oar, PharmD Referring MD: Dr Haroldine Laws PCP: Bernerd Limbo, [facility] MD: Angelena Form MD, Harrell Gave Indication 1: Atrial Fibrillation Lab Used: [facility] Site: [place] INR POC 2.9 INR RANGE 2.0-3.0  Dietary changes: no    Health status changes: yes       Details: starting O2 at night for desats into the 50s during her sleep study  Bleeding/hemorrhagic complications: no    Recent/future hospitalizations: no    Any changes in medication regimen? no    Recent/future dental: no  Any missed doses?: no       Is patient compliant with meds? yes       Allergies: 1)  ! Codeine 2)  ! Clonidine Hcl  Anticoagulation Management History:      The patient is taking warfarin and comes in today for a routine follow up visit.  Positive risk factors for bleeding include presence of serious comorbidities.  Negative risk factors for bleeding include an age less than 62 years old.  The bleeding index is 'intermediate risk'.  Positive CHADS2 values include History of HTN.  Negative CHADS2 values include Age > 29 years old.  Her last INR was 2.6.  Anticoagulation responsible provider: Angelena Form MD, Harrell Gave.  INR POC: 2.9.  Cuvette Lot#: [id number].  Exp: 06/2011.    Anticoagulation Management Assessment/Plan:      The patient's current anticoagulation dose is Coumadin 1 mg tabs: Take as Directed..  The target INR is 2.0-3.0.  The next INR is due 10/02/2010.  Anticoagulation instructions were given to patient.  Results were reviewed/authorized by Porfirio Oar, PharmD.  She was notified by Porfirio Oar PharmD.         Prior Anticoagulation Instructions: INR 2.8 Continue 1 pill everyday except 1/2 pill on Mondays and Fridays. Recheck in 2 weeks.   Current Anticoagulation Instructions: INR 2.9  Continue same dose of 1 tablet every day except 1/2 tablet on Monday and Friday.   Recheck INR in 4 weeks.

## 2010-10-08 NOTE — Assessment & Plan Note (Signed)
Summary: rov for review of sleep study   Copy to:  Glori Bickers MD Primary Wren [place]/Referring [place]:  Bernerd Limbo, [facility]  CC:  pt here to discuss sleep study..  History of Present Illness: the pt comes in today for f/u of her recent home sleep testing.  She was found to have very mild osa, with AHI of 7/hr, but had significant desaturation with 46min spent <= to 88%.  I have reviewed her study with her in detail, and answered all of her questions.    Current Medications (verified): 1)  Lisinopril-Hydrochlorothiazide 20-25 Mg Tabs (Lisinopril-Hydrochlorothiazide) .Marland Kitchen.. 1 By Mouth Daily 2)  Hydralazine Hcl 100 Mg Tabs (Hydralazine Hcl) .Marland Kitchen.. 1 By Mouth Twice Daily 3)  Spironolactone 25 Mg Tabs (Spironolactone) .... Take One Tablet By Mouth Daily 4)  Carvedilol 25 Mg Tabs (Carvedilol) .... Take One Tablet By Mouth Twice A Day 5)  Fenofibrate Micronized 134 Mg Caps (Fenofibrate Micronized) .Marland Kitchen.. 1 By Mouth At Night 6)  Omeprazole 20 Mg Cpdr (Omeprazole) .Marland Kitchen.. 1 By Mouth Two Times A Day 7)  Lipitor 80 Mg Tabs (Atorvastatin Calcium) .... Take One Tablet By Mouth Daily. 8)  Amlodipine Besylate 10 Mg Tabs (Amlodipine Besylate) .... Take One Tablet By Mouth Daily 9)  Trazodone Hcl 50 Mg Tabs (Trazodone Hcl) .Marland Kitchen.. 1 -2 At Bedtime As Needed 10)  Citalopram Hydrobromide 10 Mg Tabs (Citalopram Hydrobromide) .... Once Daily 11)  Coumadin 1 Mg Tabs (Warfarin Sodium) .... Take As Directed. 12)  Cardura 1 Mg Tabs (Doxazosin Mesylate) .... Take 1 By Mouth Once Daily 13)  Alprazolam Xr 0.5 Mg Xr24h-Tab (Alprazolam) .... As Needed  (Not Yet) 14)  Diltiazem Hcl Er Beads 120 Mg Xr24h-Cap (Diltiazem Hcl Er Beads) .... Take One Capsule By Mouth Daily  Allergies (verified): 1)  ! Codeine 2)  ! Clonidine Hcl  Review of Systems       The patient complains of shortness of breath with activity, shortness of breath at rest, non-productive cough, irregular heartbeats, acid heartburn,  anxiety, and depression.  The patient denies productive cough, coughing up blood, chest pain, loss of appetite, weight change, abdominal pain, difficulty swallowing, sore throat, tooth/dental problems, headaches, nasal congestion/difficulty breathing through nose, sneezing, itching, ear ache, hand/feet swelling, joint stiffness or pain, rash, change in color of mucus, and fever.    Vital Signs:  Patient profile:   60 year old female Height:      65 inches Weight:      152 pounds BMI:     25.39 O2 Sat:      98 % on Room air Temp:     98.2 degrees F oral Pulse rate:   50 / minute BP sitting:   138 / 68  (left arm) Cuff size:   regular  Vitals Entered By: Charma Igo (September 04, 2010 12:05 PM)  O2 Flow:  Room air  Physical Exam  General:  ow female in nad Nose:  no purulence or discharge noted. Extremities:  mild edema, no cyanosis  Neurologic:  alert, does not appear sleepy, moves all 4.   Impression & Recommendations:  Problem # 1:  OBSTRUCTIVE SLEEP APNEA (ICD-327.23) the pt was found to have very mild osa.  It is really unclear how much this may be affecting her sleep and QOL, but it certainly does not represent a significant increase in cardiac risk.  I am more concerned about her significant desaturation that is more than likely due to other factors.  I have  offered her a trial of cpap, but think she should try nocturnal oxygen first.  There have been recent studies that show successful treatment of very mild osa with nasal oxygen.  The pt really doesn't want to wear cpap, and will try the oxygen.  Problem # 2:  HYPOXEMIA (ICD-799.02) the pt has nocturnal desaturation that is out of proportion to her degree of sleep apnea.  I suspect she has some degree of underlying lung disease related to her smoking that is contributing to some of this.  Will check full pfts at next visit, and I have encouraged her to continue with  smoking cessation.    Other Orders: Est. Patient Level III  [id number]) Pulmonary Referral (Pulmonary) DME Referral (DME)  Patient Instructions: 1)  will start on oxygen during sleep 2)  will schedule for breathing tests and see you back the same day to discuss your response to the oxygen. 3)  work on weight loss 4)  followup with me in 6weeks.   Immunization History:  Influenza Immunization History:    Influenza:  historical (06/06/2010)  Pneumovax Immunization History:    Pneumovax:  historical (06/06/2010)

## 2010-10-08 NOTE — Assessment & Plan Note (Signed)
Summary: home sleep testing with apnea link   Copy to:  Glori Bickers MD Primary Provider/Referring Provider:  Bernerd Limbo, [facility]   History of Present Illness: The pt underwent home sleep testing with a type 3 device.  Airflow, effort, heart rate, pulse oximetry were all monitored.  The pt had a flow and oximetry evaluation greater than 7hrs.  The tracings have been reviewed with the following findings noted:  1) the pt was found to have 9 obstructive apneas, 3 central apneas, and 49 obstructive hypopneas.  Her AHI was 7/hr.  This is consistent with very mild osa. 2) there was oxygen desaturation as low as 58% that appeared to be related to movement of finger probe, but the pt did have significant desaturation with 62min spent less than or equal to 88%.  Desaturation was noted during her obstructive events, but also independent of the events.  Allergies: 1)  ! Codeine 2)  ! Clonidine Hcl   Impression & Recommendations:  Problem # 1:  OBSTRUCTIVE SLEEP APNEA (ICD-327.23)  very mild osa noted by sleep study.  The pt also had sigificant desaturation that occurred both during and independent of her obstructive events.    Other Orders: Sleep Std Airflow/Heartrate and O2 SAT unattended [id number])  Patient Instructions: 1)  will arrange office visit to discuss.

## 2010-10-08 NOTE — Procedures (Signed)
Summary: Holter  Holter   Imported By: Marilynne Drivers 09/23/2010 14:55:04  _____________________________________________________________________  External Attachment:    Type:   Image     Comment:   External Document

## 2010-10-08 NOTE — Progress Notes (Signed)
Summary: Pt request call  Phone Note Call from Patient Call back at Home Phone [phone]   Caller: Patient Summary of Call: Pt request call Initial call taken by: Delsa Sale,  August 13, 2010 9:20 AM  Follow-up for Phone Call        Phone Call Complete. PER PT WENT TO ER FOR AFIB ON 08/12/10 RATE RANGED FROM 95-108 CARDIZEM GIVEN IV . PT WAS NOT ADMITTED RATE NOT HIGH  ALSO WAS GIVEN SCRIPT FOR CARDIZEM 120 MG NOT SURE IF SHOULD FILL WITHOUT DISCUSSING WITH CARD. PER PT REMAINS IN AFIB . SPOKE WITH DR BENISMHON OKAY TO INITIATE CARDIZEM 120 MG AND TO F/U WITH MD IN [place] NO APPT AVAILABLE AT THAT TIME  DID SCHEDULE F/U 10/14/10 AT 11:15.PT AWARE. Follow-up by: Devra Dopp, LPN,  December  8, 624THL 10:03 AM

## 2010-10-08 NOTE — Medication Information (Signed)
Summary: rov/tm  Anticoagulant Therapy  Managed by: Tula Nakayama, RN, BSN Referring MD: Dr Haroldine Laws PCP: Bernerd Limbo, [facility] MD: Verl Blalock MD, Marcello Moores Indication 1: Atrial Fibrillation Lab Used: [facility] Site: [place] INR POC 2.8 INR RANGE 2.0-3.0  Dietary changes: no    Health status changes: no    Bleeding/hemorrhagic complications: no    Recent/future hospitalizations: no    Any changes in medication regimen? no    Recent/future dental: no  Any missed doses?: no       Is patient compliant with meds? yes       Current Medications (verified): 1)  Lisinopril-Hydrochlorothiazide 20-25 Mg Tabs (Lisinopril-Hydrochlorothiazide) .Marland Kitchen.. 1 By Mouth Daily 2)  Hydralazine Hcl 100 Mg Tabs (Hydralazine Hcl) .Marland Kitchen.. 1 By Mouth Twice Daily 3)  Spironolactone 25 Mg Tabs (Spironolactone) .... Take One Tablet By Mouth Daily 4)  Carvedilol 25 Mg Tabs (Carvedilol) .... Take One Tablet By Mouth Twice A Day 5)  Fenofibrate Micronized 134 Mg Caps (Fenofibrate Micronized) .Marland Kitchen.. 1 By Mouth At Night 6)  Omeprazole 20 Mg Cpdr (Omeprazole) .Marland Kitchen.. 1 By Mouth Two Times A Day 7)  Lipitor 80 Mg Tabs (Atorvastatin Calcium) .... Take One Tablet By Mouth Daily. 8)  Amlodipine Besylate 10 Mg Tabs (Amlodipine Besylate) .... Take One Tablet By Mouth Daily 9)  Trazodone Hcl 50 Mg Tabs (Trazodone Hcl) .Marland Kitchen.. 1 -2 At Bedtime As Needed 10)  Citalopram Hydrobromide 10 Mg Tabs (Citalopram Hydrobromide) .... Once Daily 11)  Coumadin 1 Mg Tabs (Warfarin Sodium) .... Take As Directed. 12)  Cardura 1 Mg Tabs (Doxazosin Mesylate) .... Take 1 By Mouth Once Daily 13)  Alprazolam Xr 0.5 Mg Xr24h-Tab (Alprazolam) .... As Needed  (Not Yet) 14)  Diltiazem Hcl Er Beads 120 Mg Xr24h-Cap (Diltiazem Hcl Er Beads) .... Take One Capsule By Mouth Daily  Allergies: 1)  ! Codeine 2)  ! Clonidine Hcl  Anticoagulation Management History:      The patient is taking warfarin  and comes in today for a routine follow up visit.  Positive risk factors for bleeding include presence of serious comorbidities.  Negative risk factors for bleeding include an age less than 55 years old.  The bleeding index is 'intermediate risk'.  Positive CHADS2 values include History of HTN.  Negative CHADS2 values include Age > 6 years old.  Her last INR was 2.6.  Anticoagulation responsible provider: Verl Blalock MD, Marcello Moores.  INR POC: 2.8.  Cuvette Lot#: [id number].  Exp: 09/2011.    Anticoagulation Management Assessment/Plan:      The patient's current anticoagulation dose is Coumadin 1 mg tabs: Take as Directed..  The target INR is 2.0-3.0.  The next INR is due 09/04/2010.  Anticoagulation instructions were given to patient.  Results were reviewed/authorized by Tula Nakayama, RN, BSN.  She was notified by Tula Nakayama, RN, BSN.         Prior Anticoagulation Instructions: INR 5.0 Skip today and Saturdays dose then change dose to 1 pill everyday except 1/2 pill on Mondays and Fridays. Recheck in one week.   Current Anticoagulation Instructions: INR 2.8 Continue 1 pill everyday except 1/2 pill on Mondays and Fridays. Recheck in 2 weeks.

## 2010-10-08 NOTE — Assessment & Plan Note (Signed)
Summary: consult for possible osa   Visit Type:  Initial Consult Copy to:  Glori Bickers MD Primary Provider/Referring Provider:  Bernerd Limbo, [facility]:  Sleep Consult. pt c/o snoring, restlessness, and difficulty breathin when lying flat back.  History of Present Illness: The pt is a 60y/o female who I have been asked to see for possible osa.  She has had a sleep study in 2006 and 2007, both of which showed no significant sleep disordered breathing.  Currently, the pt has been noted to have snoring, but no one has mentioned an abnormal breathing pattern during sleep.  She denies choking arousals.  She goes to bed around midnight, and awakens btw 9-11am to start her day.  She has multiple awakenings during the night due to the dog waking her up, and does not feel rested the next day.  She can experience sleep pressure during the day with reading and watching tv, but it is only intermittantly and not overly significant.  Her symptoms have day to day variability.  She has minimal sleep pressure with driving.  Her weight is up about 15 pounds over the last 2 years, and her epworth score today is mildly abnormal at 11.   Preventive Screening-Counseling & Management  Alcohol-Tobacco     Smoking Status: quit  Current Medications (verified): 1)  Lisinopril-Hydrochlorothiazide 20-25 Mg Tabs (Lisinopril-Hydrochlorothiazide) .Marland Kitchen.. 1 By Mouth Daily 2)  Hydralazine Hcl 100 Mg Tabs (Hydralazine Hcl) .Marland Kitchen.. 1 By Mouth Twice Daily 3)  Spironolactone 25 Mg Tabs (Spironolactone) .... Take One Tablet By Mouth Daily 4)  Carvedilol 25 Mg Tabs (Carvedilol) .... Take One Tablet By Mouth Twice A Day 5)  Fenofibrate Micronized 134 Mg Caps (Fenofibrate Micronized) .Marland Kitchen.. 1 By Mouth At Night 6)  Omeprazole 20 Mg Cpdr (Omeprazole) .Marland Kitchen.. 1 By Mouth Two Times A Day 7)  Pravastatin Sodium 80 Mg Tabs (Pravastatin Sodium) .... Take 1 Tablet By Mouth Once A Day 8)  Amlodipine Besylate 10 Mg Tabs  (Amlodipine Besylate) .... Take One Tablet By Mouth Daily 9)  Trazodone Hcl 50 Mg Tabs (Trazodone Hcl) .Marland Kitchen.. 1 -2 At Bedtime As Needed 10)  Citalopram Hydrobromide 10 Mg Tabs (Citalopram Hydrobromide) .... Once Daily 11)  Coumadin 1 Mg Tabs (Warfarin Sodium) .... Take As Directed. 12)  Cardura 1 Mg Tabs (Doxazosin Mesylate) .... Take 1 By Mouth Once Daily 13)  Alprazolam Xr 0.5 Mg Xr24h-Tab (Alprazolam) .... As Needed  (Not Yet)  Allergies (verified): 1)  ! Codeine 2)  ! Clonidine Hcl  Past History:  Past Medical History: Reviewed history from 07/15/2010 and no changes required.  1. CAD      a. cath 5/09 heavily calcified vessels. LAD 60,6m, 90 distal, LCX 60, 40 RCA 40s. normal EF            -->medical Rx  2. Severe PAD      a. s/p bilateral renal artery stents. moderate in-stent restenosis      b. AAA (3.7 x 3.3) by u/s 5/09  3. Carotid stenosis      a. s/p L CEA 5/09  4. Hypertension.   5. Hyperlipidemia.   6. COPD with ongoing tobacco use   7. GERD with hiatal hernia.   8. DM2 9. Paroxysmal AF 10. GIB s/p EGD with cautery in 9/11 at [facility]  Past Surgical History: Carotid Endarterectomy (L) hysterectomy hand surgery  Family History: Reviewed history from 11/20/2008 and no changes required.  76. Brother, age 58 years, with CABG  this year.   2. Mother died at age 39 from [place] aneurysm rupture.   3. Father died at age 49 years from massive heart attack.   4. Strong family history of vascular disease in grandparents, uncles       and dad.  emphysema--mother and father  Social History: Reviewed history from 11/20/2008 and no changes required. Widowed in 2009.  Patient worked as a Scientist, water quality in the past.   Patient states former smoker. quit 02/2010. <1 ppd. started age 43.  Smoking Status:  quit  Review of Systems       The patient complains of shortness of breath at rest, non-productive cough, irregular heartbeats, acid heartburn, anxiety, depression, hand/feet swelling, and  joint stiffness or pain.  The patient denies shortness of breath with activity, productive cough, coughing up blood, chest pain, indigestion, loss of appetite, weight change, abdominal pain, difficulty swallowing, sore throat, tooth/dental problems, headaches, nasal congestion/difficulty breathing through nose, sneezing, itching, ear ache, rash, change in color of mucus, and fever.    Vital Signs:  Patient profile:   60 year old female Height:      65 inches Weight:      153.38 pounds BMI:     25.62 O2 Sat:      99 % on Room air Temp:     97.9 degrees F oral Pulse rate:   64 / minute BP sitting:   138 / 68  (left arm) Cuff size:   regular  Vitals Entered By: Charma Igo (August 05, 2010 9:08 AM)  O2 Flow:  Room air CC: Sleep Consult. pt c/o snoring, restlessness, difficulty breathin when lying flat back Comments meds and allergies updated Phone number updated Charma Igo  August 05, 2010 9:08 AM    Physical Exam  General:  mildly overweight female in nad Eyes:  PERRLA and EOMI.   Nose:  deviated septum to right, narrowing bilat with turbinate hypertrophy Mouth:  long palate, normal uvula, small post. pharyngeal space. Neck:  no jvd, tmg, LN Lungs:  clear except occasional rhonchi Heart:  sounds regular with cvr, 2/6 sem Abdomen:  soft and nontender, bs+ Extremities:  mild ankle edema on right, no cyanosis  pulses decreased distally but present. Neurologic:  alert and oriented, moves all 4. does not appear sleepy   Impression & Recommendations:  Problem # 1:  SNORING (ICD-786.09) the pt has a h/o snoring, and some suggestion that it is impacting her sleep and daytime alertness.  She has been tested in the past for sleep apnea, and each time was really unremarkable.  She may just have symptomatic snoring or the upper airway resistance syndrome, but with her underlying CV issues, I think it is worthwhile to put the issue to rest.  Will try to set up for a home sleep study,  and the pt is agreeable to this.  Other Orders: Consultation Level IV [id number]) Misc. Referral (Misc. Ref)  Patient Instructions: 1)  will set up for home sleep study to put issue of sleep apnea to rest. 2)  will call you with results when available.  Appended Document: consult for possible osa Suanne Marker, please stay on the pt about getting this study.Marland Kitchenlet her know that osa can worsen afib.    Appended Document: consult for possible osa Pt advised and pt picked up apnea link today.   Appended Document: consult for possible osa please arrange followup with pt to discuss sleep study results.  It does show apnea that is mild, but there  is a lot of oxygen level decreases.  set up for next week or week after?  Appended Document: consult for possible osa lmomtcb x1  Appended Document: consult for possible osa lmomtcb x2  Appended Document: consult for possible osa pt coming in 12/30

## 2010-10-09 NOTE — Letter (Signed)
Summary: [facility] Visit   [facility] Visit   Imported By: Sallee Provencal 05/22/2010 11:13:50  _____________________________________________________________________  External Attachment:    Type:   Image     Comment:   External Document

## 2010-10-14 NOTE — Assessment & Plan Note (Signed)
Summary: rov  episode of afib was  seen in er 08/12/10 [facility]   Visit Type:  Follow-up Referring Provider:  Glori Bickers MD Primary Provider:  Bernerd Limbo, [facility]  CC:  Episode of A-fib. Pt. seen in ER 08-12-10.  History of Present Illness: [name] is a 60 y/o woman with severe vascular disease followed by Dr. [name], COPD, HTN, CAD with borderline LAD lesion (manged medically), PAF and markedly abnormal ECG with persistent severe T wave inversions. Also had severe GIB in 10/11.  Doing fairly well. In early December readmitted to [facility] for 3rd episode of AFib. Rate was controlled on admit. Converted spontaneously after 30 hours. When in AF feels SOB even if slow.  Tolerating coumadin without problem.   Also had sleep study with AHI 7 and O2 desats. Unable to tolerate CPAP now wearing O2 at night. Denies CP. Occasional dyspnea. No longer smoking. Quit 03/05/10 at 5pm.  Cr stable at 2.0. Follwoed by PCP has not gone to see Renal yet.    Current Medications (verified): 1)  Lisinopril-Hydrochlorothiazide 20-25 Mg Tabs (Lisinopril-Hydrochlorothiazide) .Marland Kitchen.. 1 By Mouth Daily 2)  Hydralazine Hcl 100 Mg Tabs (Hydralazine Hcl) .Marland Kitchen.. 1 By Mouth Twice Daily 3)  Spironolactone 25 Mg Tabs (Spironolactone) .... Take One Tablet By Mouth Daily 4)  Carvedilol 25 Mg Tabs (Carvedilol) .... Take One Tablet By Mouth Twice A Day 5)  Fenofibrate Micronized 134 Mg Caps (Fenofibrate Micronized) .Marland Kitchen.. 1 By Mouth At Night 6)  Omeprazole 20 Mg Cpdr (Omeprazole) .Marland Kitchen.. 1 By Mouth Two Times A Day 7)  Lipitor 80 Mg Tabs (Atorvastatin Calcium) .... Take One Tablet By Mouth Daily. 8)  Amlodipine Besylate 10 Mg Tabs (Amlodipine Besylate) .... Take One Tablet By Mouth Daily 9)  Trazodone Hcl 50 Mg Tabs (Trazodone Hcl) .Marland Kitchen.. 1 -2 At Bedtime As Needed 10)  Citalopram Hydrobromide 10 Mg Tabs (Citalopram Hydrobromide) .... Once Daily 11)  Coumadin 1 Mg Tabs (Warfarin Sodium) .... Take As  Directed. 12)  Cardura 1 Mg Tabs (Doxazosin Mesylate) .... Take 1 By Mouth Once Daily 13)  Diltiazem Hcl Er Beads 120 Mg Xr24h-Cap (Diltiazem Hcl Er Beads) .... Take One Capsule By Mouth Daily 14)  Oxigen 2 Litters .... At Bedtime  Allergies: 1)  ! Codeine 2)  ! Clonidine Hcl  Review of Systems       As per HPI and past medical history; otherwise all systems negative.   Vital Signs:  Patient profile:   60 year old female Height:      65 inches Weight:      154 pounds BMI:     25.72 Pulse rate:   57 / minute Resp:     18 per minute BP sitting:   156 / 70  (right arm)  Vitals Entered By: Sidney Ace (September 23, 2010 10:42 AM)  Physical Exam  General:  Well appearing. no resp difficulty HEENT: normal Neck: supple. no JVD. Carotids 2+ bilat; Left CEA. + L bruit. No lymphadenopathy or thryomegaly appreciated. Cor: PMI nondisplaced. Regular rate & rhythm. No rubs, gallops, murmur. Lungs: clear Abdomen: soft, nontender, nondistended. No hepatosplenomegaly. No bruits or masses. Good bowel sounds. Extremities: no cyanosis, clubbing, rash. trivial ankle edema on R. DP trace L, 2+R  no sores Neuro: alert & orientedx3, cranial nerves grossly intact. moves all 4 extremities w/o difficulty. affect pleasant   Impression & Recommendations:  Problem # 1:  CAD, NATIVE VESSEL (ICD-414.01) Stable. No evidence of ischemia. Continue current regimen.  Problem # 2:  ATRIAL FIBRILLATION (ICD-427.31) Continues to have intermittent episodes of AF. If become more frequent ma need to consider anti-arrhthymic agent. Continue coumadin.  Problem # 3:  HYPERTENSION, BENIGN (ICD-401.1) Blood pressure well controlled. Continue current regimen.  Problem # 4:  RENAL INSUFFICIENCY Will refer back to Nephrology.  Patient Instructions: 1)  Your physician recommends that you schedule a follow-up appointment in: 6 months 2)  Your physician recommends that you continue on your current medications as  directed. Please refer to the Current Medication list given to you today. 3)  You have been referred to [facility]

## 2010-10-14 NOTE — Medication Information (Signed)
Summary: coumadin ck/rs  from 1-31/mt  Anticoagulant Therapy  Managed by: Tula Nakayama, RN, BSN Referring MD: Dr Haroldine Laws PCP: Bernerd Limbo, [facility] MD: Lovena Le MD, Carleene Overlie Indication 1: Atrial Fibrillation Lab Used: [facility] Site: [place] INR POC 2.1 INR RANGE 2.0-3.0  Dietary changes: no    Health status changes: no    Bleeding/hemorrhagic complications: no    Recent/future hospitalizations: no    Any changes in medication regimen? no    Recent/future dental: no  Any missed doses?: no       Is patient compliant with meds? yes       Current Medications (verified): 1)  Lisinopril-Hydrochlorothiazide 20-25 Mg Tabs (Lisinopril-Hydrochlorothiazide) .Marland Kitchen.. 1 By Mouth Daily 2)  Hydralazine Hcl 100 Mg Tabs (Hydralazine Hcl) .Marland Kitchen.. 1 By Mouth Twice Daily 3)  Spironolactone 25 Mg Tabs (Spironolactone) .... Take One Tablet By Mouth Daily 4)  Carvedilol 25 Mg Tabs (Carvedilol) .... Take One Tablet By Mouth Twice A Day 5)  Fenofibrate Micronized 134 Mg Caps (Fenofibrate Micronized) .Marland Kitchen.. 1 By Mouth At Night 6)  Omeprazole 20 Mg Cpdr (Omeprazole) .Marland Kitchen.. 1 By Mouth Two Times A Day 7)  Pravachol 80 Mg Tabs (Pravastatin Sodium) .... Take 1 Tab Qhs 8)  Amlodipine Besylate 10 Mg Tabs (Amlodipine Besylate) .... Take One Tablet By Mouth Daily 9)  Trazodone Hcl 50 Mg Tabs (Trazodone Hcl) .Marland Kitchen.. 1 -2 At Bedtime As Needed 10)  Citalopram Hydrobromide 10 Mg Tabs (Citalopram Hydrobromide) .... Once Daily 11)  Coumadin 1 Mg Tabs (Warfarin Sodium) .... Take As Directed. 12)  Cardura 1 Mg Tabs (Doxazosin Mesylate) .... Take 1 By Mouth Once Daily 13)  Diltiazem Hcl Er Beads 120 Mg Xr24h-Cap (Diltiazem Hcl Er Beads) .... Take One Capsule By Mouth Daily 14)  Oxigen 2 Litters .... At Bedtime  Allergies: 1)  ! Codeine 2)  ! Clonidine Hcl  Anticoagulation Management History:      The patient is taking warfarin and comes in today for a  routine follow up visit.  Positive risk factors for bleeding include presence of serious comorbidities.  Negative risk factors for bleeding include an age less than 52 years old.  The bleeding index is 'intermediate risk'.  Positive CHADS2 values include History of HTN.  Negative CHADS2 values include Age > 77 years old.  Her last INR was 2.6.  Anticoagulation responsible provider: Lovena Le MD, Carleene Overlie.  INR POC: 2.1.  Exp: 06/2011.    Anticoagulation Management Assessment/Plan:      The patient's current anticoagulation dose is Coumadin 1 mg tabs: Take as Directed..  The target INR is 2.0-3.0.  The next INR is due 11/04/2010.  Anticoagulation instructions were given to patient.  Results were reviewed/authorized by Tula Nakayama, RN, BSN.  She was notified by Tula Nakayama, RN, BSN.         Prior Anticoagulation Instructions: INR 2.9  Continue same dose of 1 tablet every day except 1/2 tablet on Monday and Friday.  Recheck INR in 4 weeks.   Current Anticoagulation Instructions: INR 2.1 Continue 1mg s everyday except 0.5mg s on Mondays and Fridays. Recheck in 4 weeks.

## 2010-10-15 DIAGNOSIS — I4891 Unspecified atrial fibrillation: Secondary | ICD-10-CM

## 2010-10-16 ENCOUNTER — Ambulatory Visit: Payer: Self-pay | Admitting: Pulmonary Disease

## 2010-10-23 ENCOUNTER — Ambulatory Visit: Payer: Medicare Other | Admitting: Pulmonary Disease

## 2010-10-26 ENCOUNTER — Telehealth: Payer: Self-pay | Admitting: Pulmonary Disease

## 2010-11-03 NOTE — Progress Notes (Signed)
Summary: nos appt  Phone Note Call from Patient   Caller: [email or web address]  Call For: clance Summary of Call: Rsc nos from 2/17 to 3/15. Initial call taken by: Netta Neat,  October 26, 2010 9:28 AM

## 2010-11-09 ENCOUNTER — Encounter (INDEPENDENT_AMBULATORY_CARE_PROVIDER_SITE_OTHER): Payer: Medicare Other

## 2010-11-09 ENCOUNTER — Encounter: Payer: Self-pay | Admitting: Cardiology

## 2010-11-09 DIAGNOSIS — Z7901 Long term (current) use of anticoagulants: Secondary | ICD-10-CM

## 2010-11-09 DIAGNOSIS — I4891 Unspecified atrial fibrillation: Secondary | ICD-10-CM

## 2010-11-09 LAB — CONVERTED CEMR LAB: POC INR: 1.8

## 2010-11-17 NOTE — Medication Information (Signed)
Summary: rov/tm/rs per pt call=mj  Anticoagulant Therapy  Managed by: Freddrick March, RN, BSN Referring MD: Dr Haroldine Laws PCP: Bernerd Limbo, [facility] MD: Verl Blalock MD, Marcello Moores Indication 1: Atrial Fibrillation Lab Used: [facility] Site: [place] INR POC 1.8 INR RANGE 2.0-3.0  Dietary changes: no    Health status changes: no    Bleeding/hemorrhagic complications: no    Recent/future hospitalizations: no    Any changes in medication regimen? no    Recent/future dental: no  Any missed doses?: no       Is patient compliant with meds? yes       Allergies: 1)  ! Codeine 2)  ! Clonidine Hcl  Anticoagulation Management History:      The patient is taking warfarin and comes in today for a routine follow up visit.  Positive risk factors for bleeding include presence of serious comorbidities.  Negative risk factors for bleeding include an age less than 58 years old.  The bleeding index is 'intermediate risk'.  Positive CHADS2 values include History of HTN.  Negative CHADS2 values include Age > 58 years old.  Her last INR was 2.6.  Anticoagulation responsible provider: Verl Blalock MD, Marcello Moores.  INR POC: 1.8.  Cuvette Lot#: [id number].  Exp: 09/2010.    Anticoagulation Management Assessment/Plan:      The patient's current anticoagulation dose is Coumadin 1 mg tabs: Take as Directed..  The target INR is 2.0-3.0.  The next INR is due 11/30/2010.  Anticoagulation instructions were given to patient.  Results were reviewed/authorized by Freddrick March, RN, BSN.  She was notified by Freddrick March RN.         Prior Anticoagulation Instructions: INR 2.1 Continue 1mg s everyday except 0.5mg s on Mondays and Fridays. Recheck in 4 weeks.   Current Anticoagulation Instructions: INR 1.8  Start taking 1 tablet daily except 1/2 tablet on Fridays.  Recheck in 3 weeks.

## 2010-11-19 ENCOUNTER — Ambulatory Visit: Payer: Medicare Other | Admitting: Pulmonary Disease

## 2010-11-30 ENCOUNTER — Encounter: Payer: Medicare Other | Admitting: *Deleted

## 2010-12-03 ENCOUNTER — Encounter: Payer: Medicare Other | Admitting: *Deleted

## 2010-12-09 ENCOUNTER — Encounter: Payer: Medicare Other | Admitting: *Deleted

## 2010-12-10 ENCOUNTER — Encounter: Payer: Medicare Other | Admitting: *Deleted

## 2010-12-14 ENCOUNTER — Other Ambulatory Visit: Payer: Self-pay

## 2010-12-15 ENCOUNTER — Ambulatory Visit: Payer: Medicare Other | Admitting: Vascular Surgery

## 2010-12-16 ENCOUNTER — Encounter: Payer: Medicare Other | Admitting: *Deleted

## 2010-12-17 ENCOUNTER — Ambulatory Visit (INDEPENDENT_AMBULATORY_CARE_PROVIDER_SITE_OTHER): Payer: Medicare Other | Admitting: *Deleted

## 2010-12-17 DIAGNOSIS — Z7901 Long term (current) use of anticoagulants: Secondary | ICD-10-CM | POA: Insufficient documentation

## 2010-12-17 DIAGNOSIS — I4891 Unspecified atrial fibrillation: Secondary | ICD-10-CM

## 2010-12-17 LAB — POCT INR: INR: 1.9

## 2010-12-31 ENCOUNTER — Encounter: Payer: Medicare Other | Admitting: *Deleted

## 2011-01-04 ENCOUNTER — Encounter: Payer: Medicare Other | Admitting: *Deleted

## 2011-01-08 ENCOUNTER — Encounter: Payer: Medicare Other | Admitting: *Deleted

## 2011-01-13 ENCOUNTER — Encounter: Payer: Medicare Other | Admitting: *Deleted

## 2011-01-13 ENCOUNTER — Other Ambulatory Visit: Payer: Self-pay | Admitting: Internal Medicine

## 2011-01-18 ENCOUNTER — Encounter: Payer: Medicare Other | Admitting: *Deleted

## 2011-01-19 NOTE — Assessment & Plan Note (Signed)
[facility]                            CARDIOLOGY OFFICE NOTE   NAME:Hamlett, AQUISHA WILBORNE                        MRN:          [id number]  DATE:05/07/2008                            DOB:          1951-08-31    INTERVAL HISTORY:  [name] is a very pleasant 60 year old woman with  history of hypertension, hyperlipidemia, diabetes, ongoing tobacco use  and severe vascular disease.   She has been followed by Dr. [name] for carotid artery stenosis and  recently underwent left carotid endarterectomy.  She also has renal  artery stents with evidence of in-stent restenosis and she is pending  another angioplasty.   She has also had coronary artery disease with abnormal EKG and deep  anterolateral T-wave inversions.  Underwent catheterization in September  2007 with Dr. Tami Ribas.  She had 60-70% lesion in the mid LAD followed by  90% lesion in the distal LAD.  This was treated medically,  nonobstructive disease elsewhere.  EF was normal.   Prior to her endarterectomy, we repeated her catheterization and once  again, she had got an 80% lesion in mid-to-distal LAD.  I have reviewed  this with Dr. Burt Knack and given the fact that she is asymptomatic and the  nature of the lesion, we decided to follow it medically.   She returns today for routine followup.  She continues to have very  brief episodes of occasional chest pain, this is nonexertional.  It is  not reproducible.  There has been no associated shortness of breath.  We  have been recently titrating her blood pressure medications and she says  at that time she feels quite lightheaded, especially when she is working  on things over her head.  Apparently, she continues to complain of  intermittent episodes where her legs just feel like that they are going  to give out and turn to rubber.  If she leans forward, this sort of gets  better.  She denies any bowel or bladder incontinence.   CURRENT MEDICATIONS:  1.  Lisinopril/HCTZ 20/12.5 a day.  2. Prilosec 20 a day.  3. Plavix 75 a day.  4. Amlodipine 10 a day.  5. Folic acid.  6. Hydralazine 150 b.i.d.  7. Metformin 1000 b.i.d.  8. Simvastatin 40 a day.  9. Carvedilol 18.75 b.i.d.   PHYSICAL EXAMINATION:  GENERAL:  She is in no acute distress.  She  ambulates around the clinic without respiratory difficulty.  VITAL SIGNS:  Blood pressure is initially 140/70, manual recheck 160/64;  heart rate 61; weight is 145.  HEENT:  Normal.  NECK:  Supple.  There is no JVD.  Carotids are 2+ bilaterally.  She has  carotid endarterectomy scar on the left.  There are bilateral bruits.  CARDIAC:  PMI is nondisplaced.  She is bradycardic and regular with an  S4, no murmur.  LUNGS:  Clear.  ABDOMEN:  Soft, nontender, nondistended.  No hepatosplenomegaly, no  bruits, no masses.  Good bowel sounds.  EXTREMITIES:  Warm with no cyanosis, clubbing or edema.  DP pulses are  1+ bilaterally.  NEURO:  Alert  and oriented x3.  Cranial nerves II-XII are intact.  Moves  all 4 without difficulty.  Affect is pleasant.   EKG shows sinus rhythm with persistent T-wave inversions across the  precordium.   ASSESSMENT AND PLAN:  1. Coronary artery disease.  This is stable.  No evidence of ongoing      ischemia.  We will continue to treat her medically.  2. Hypertension.  Blood pressure has improved but still remains      elevated.  Unfortunately, given her symptoms of orthostasis, I do      not want be too aggressive at this point until she adjusts better.      I am concerned that her hydralazine dose is a bit odd.  We will      change her to 100 b.i.d.  We will consider possibly adding      spironolactone in the future.  3. Hyperlipidemia.  Goal LDL is less than 70.  Given his severe      vascular disease, I think, we need to be very aggressive about      this.  This is followed by a primary care physician.  4. Ongoing tobacco use.  Once again, I counseled her on the  need to      stop smoking.  5. Peripheral vascular disease followed by Dr. [name].   DISPOSITION:  Overall, Mitzi is doing okay.  [place].  She will continue to follow up with [place], but we will have an  emergency contact done there should she get into trouble.     Shaune Pascal. Bensimhon, MD  Electronically Signed    DRB/MedQ  DD: 05/07/2008  DT: 05/08/2008  Job #: [id number]   cc:   Lanice Shirts, M.D.  [name] [name], M.D.

## 2011-01-19 NOTE — Assessment & Plan Note (Signed)
OFFICE VISIT   [name], [name]  DOB:  November 29, 1950                                       03/25/2009  [id number]   [name] returns for followup regarding her carotid occlusive disease,  abdominal aortic aneurysm, and lower extremity occlusive disease.  She  had a left carotid endarterectomy performed on 09/082009 for severe but  asymptomatic left internal carotid stenosis.  She has had no neurologic  symptoms such as hemiparesis, aphasia, amaurosis fugax, diplopia,  blurred vision, or syncope in the past year.  She does have a burning  discomfort from her back radiating around both thighs and into the legs  which is affected by walking and is bilateral, right equal to left.  She  has no rest pain or true claudication symptoms in the calves except for  some mild left calf claudication which she has had for many years and  has not changed.  She has been stable from a cardiac standpoint and the  decision was made not to proceed with PTCA and stenting by Dr. Haroldine Laws  1 year ago.   On physical exam today, blood pressure 102/64, heart rate 68,  respirations 14.  Carotid pulses 3+ with harsh bruit on the left, soft  bruit on the right.  NEUROLOGIC:  Normal.  CHEST:  Clear to auscultation.  ABDOMEN:  Soft, nontender, no masses.  She has 3+ femoral pulses  bilaterally and a 2+ right dorsalis pedis pulse.  Carotid duplex exam  reveals an approximate 54 cm recurrent stenosis on the left side with  high velocities in the left external carotid accounting with harsh  bruit.  The right internal carotid stenosis is stable at 50%.  Aortic  aneurysm is unchanged at 3. 5 cm and ABIs of 0.59 on the left and 0.81  on the right.   In general, I think she is stable and she will return in 1 year for  repeat of all these studies as she now lives on the [place].  If she has  any symptoms in the interim, she will be in touch with [place] at that time.   [name] [name], M.D.  Electronically Signed   JDL/MEDQ  D:  03/25/2009  T:  03/26/2009  Job:  [id number]

## 2011-01-19 NOTE — Assessment & Plan Note (Signed)
[facility]                            CARDIOLOGY OFFICE NOTE   NAME:Mcfall, ANANA DEYTON                        MRN:          [id number]  DATE:03/05/2008                            DOB:          05-Dec-1950    INTERVAL HISTORY:  [name] is a very pleasant 60 year old woman with  multiple medical problems including hypertension, hyperlipidemia,  diabetes, ongoing tobacco use, renal insufficiency, and severe vascular  disease.   She just recently underwent left carotid endarterectomy by Dr. [name].  Prior to her surgery, she did have cardiac catheterization which showed  approximately 80% mid LAD lesion which was stable.  Ejection fraction  was normal.  There was also some in-stent restenosis of her renal artery  stents.   She returns today for routine followup.  She continues to have  occasional chest pain, but this is chronic without any change.  She also  has brief palpitations, but nothing sustained, these are just flip-  flops.  She does have stable claudication.  Unfortunately, she continues  to smoke about 1 to 1-1/2 packs a day, but she is trying hard with her  husband to stop.   CURRENT MEDICATIONS:  1. Lisinopril/HCTZ 20/25 a day.  2. Metformin 1000 a day.  3. Metoprolol 150 b.i.d.  4. Prilosec 20 a day.  5. Plavix 75 a day.  6. Amlodipine 10 a day.  7. Simvastatin 20 a day.  8. Folic acid.  9. Hydralazine 150 b.i.d.   PHYSICAL EXAMINATION:  She is in no acute distress, ambulating in the  clinic without any respiratory difficulty.  Blood pressure is 148/58, heart rate is 55, and weight is 144.  HEENT:  Normal.  NECK:  Supple.  No JVD.  Carotids are 2+ bilaterally.  There is a  healing surgical scar on the left.  There are bilateral bruits.  CARDIAC:  PMI is nondisplaced.  She has regular rate and rhythm with an  S4.  No murmur.  LUNGS:  Clear with no wheezing.  ABDOMEN:  Soft, nontender, and nondistended.  No hepatosplenomegaly.  No  bruits.  No masses.  Good bowel sounds.  EXTREMITIES:  Warm with no cyanosis, clubbing, or edema.  DP pulses are  1+ on the right and same on the left.  NEUROLOGIC:  Alert and oriented x3.  Cranial nerves II through XII are  intact.  Moves all 4 extremities without difficulty.  Affect is  pleasant.   ASSESSMENT:  1. Coronary artery disease.  She does have significant left anterior      descending disease.  We reviewed this prior to her surgery and made      the decision that we would address this after her surgery.  She is      now kind of out from her endarterectomy.  I think that we proceed      safely.  We will have Dr. Burt Knack reevaluate her films or plan her      for elective angioplasty next week.  2. Hypertension.  Blood pressure remains elevated.  We will switch her  metoprolol over to Coreg 12.5 b.i.d. and see how this does.  We may      also consider spironolactone in the future.  I suspect that this      would be somewhat difficult to control.  3. Hyperlipidemia.  Goal LDL is less than 70.  Given her upcoming      angioplasty, I think it is reasonable to increase her Zocor to 40      mg a day.  4. Tobacco use.  Once again, I counseled her on the need to stop      smoking.  5. Peripheral vascular disease.  This is followed by Dr. [name].     Shaune Pascal. Bensimhon, MD  Electronically Signed    DRB/MedQ  DD: 03/05/2008  DT: 03/06/2008  Job #: [id number]   cc:   [name]. [name], M.D.  Lanice Shirts, M.D.

## 2011-01-19 NOTE — Procedures (Signed)
CAROTID DUPLEX EXAM   INDICATION:  Followup evaluation of known carotid artery disease.   HISTORY:  Diabetes:  No.  Cardiac:  CAD.  Hypertension:  Yes.  Smoking:  Yes.  Previous Surgery:  Left carotid endarterectomy with Dacron patch  angioplasty on 02/02/2008 by Dr. [name].  CV History:  Amaurosis Fugax No, Paresthesias No, Hemiparesis No                                       RIGHT             LEFT  Brachial systolic pressure:         156               124  Brachial Doppler waveforms:         WNL               Biphasic  Vertebral direction of flow:        Antegrade         Antegrade  DUPLEX VELOCITIES (cm/sec)  CCA peak systolic                   131               [id number]  ECA peak systolic                   156               [id number]  ICA peak systolic                   152               0000000  ICA end diastolic                   47                52  PLAQUE MORPHOLOGY:                  Calcified         Homogeneous  PLAQUE AMOUNT:                      Moderate          Moderate  PLAQUE LOCATION:                    Bif/ICA/ECA       Bif/ICA/ECA   IMPRESSION:  1. 40-59% right internal carotid artery stenosis.  2. 40-59% left internal carotid artery restenosis.   ___________________________________________  [name] [name], M.D.   AC/MEDQ  D:  03/25/2009  T:  03/26/2009  Job:  [id number]

## 2011-01-19 NOTE — Discharge Summary (Signed)
[name], FONNESBECK                 ACCOUNT NO.:  [id number]   MEDICAL RECORD NO.:  [id number]          PATIENT TYPE:  INP   LOCATION:  41                         FACILITY:  [facility]   PHYSICIAN:  [name]. [name], M.D.  DATE OF BIRTH:  July 09, 1951   DATE OF ADMISSION:  02/02/2008  DATE OF DISCHARGE:                               DISCHARGE SUMMARY   DISCHARGE DIAGNOSES:  1. Left carotic occlusive disease.  2. Hypertension.  3. Hyperlipidemia.  4. Coronary artery stenosis.  5. Coronary artery disease, significant for single-vessel disease.  6. Diabetes mellitus.  7. Anemia.   PROCEDURES PERFORMED:  Left carotid endarterectomy, 02/02/2008 by Dr.  [name].   COMPLICATIONS:  None.   DISCHARGE MEDICATIONS:  1. Hexosamine 150 mg p.o. b.i.d.  2. Lisinopril and hydrochlorothiazide 20/25 one p.o. daily.  3. Metformin 1000 mg p.o. b.i.d.  4. Metoprolol 150 mg p.o. b.i.d.  5. Prilosec 20 mg p.o. daily.  6. She is instructed to resume her Plavix 75 mg p.o. daily.  7. Amlodipine 10 mg p.o. nightly.  8. Simvastatin 20 mg p.o. daily.  9. BC powder p.r.n.  10.Ibuprofen 600 mg p.r.n.  11.Tylenol 100 mg p.r.n.  12.Percocet 5/325 one p.o. q.4 h. p.r.n. pain; total #20 were given.   CONDITION AT DISCHARGE:  Stable and improved.   DISPOSITION:  She is discharged home in stable condition with her wound  healing well.  She is instructed to clean the wound with soap and warm  water.  She is instructed to increase her activity slowly.  She may walk  upstairs.  She may shower, starting 02/04/2008.  She should not lift  heavy objects for 2 weeks.  She should not drive for 2 weeks.  She will  be given an appointment with Dr. [name] in 2 weeks.  The office will  call with the appointment.   BRIEF IDENTIFYING STATEMENT:  For complete details, please refer to the  typed history and physical.   Briefly, this very pleasant 60 year old woman with diabetes and multiple  areas of peripheral vascular  disease and cardiovascular disease was  referred to Dr. [name] for carotid occlusive disease.  Evaluation  demonstrated a very tight narrowing of her left carotid artery.  Dr.  [name] felt that she should undergo left carotid endarterectomy.  She  was informed of the risks and benefits of the procedure and after  careful consideration, she elected to proceed with surgery.   HOSPITAL COURSE:  Preoperative workup was completed as an outpatient.  She was brought in through same-day surgery and underwent the  aforementioned left carotid endarterectomy.  For complete details,  please refer to the typed operative report.  The procedure was without  complications.  She was returned to the postanesthesia care unit,  extubated.  Following stabilization, she was transferred to a bed in a  surgical step-down unit.  She was observed overnight.  On the following  morning, she was neurologically nonfocal.  Her wound was healing well.  Her tongue was midline.  There was no difficulty swallowing or  dysarthria.  Her smile was symmetrical.  She was felt stable.  Her JP  drain was discontinued.  She was discharged home.  At her followup  visit, Dr. [name] will address her renal artery stenosis.  Her single-  vessel coronary artery disease will be treated by her cardiologist  following recovery from her carotid endarterectomy.      Chad Cordial, PA      [name] [name], M.D.  Electronically Signed    KEL/MEDQ  D:  02/03/2008  T:  02/03/2008  Job:  [id number]

## 2011-01-19 NOTE — Assessment & Plan Note (Signed)
[facility] OFFICE NOTE   ELLENORA, BOTTINO                        MRN:          [id number]  DATE:07/16/2008                            DOB:          [birth year]    VASCULAR SURGEON:  [name]. [name], MD   INTERVAL HISTORY:  [name] is very pleasant 60 year old woman with a  history of hypertension, hyperlipidemia, diabetes, ongoing tobacco use,  and severe vascular disease.   She had been followed by Dr. [name] for carotid artery stenosis and  previously underwent a left carotid endarterectomy.  She also has renal  artery stenosis with bilateral renal artery stents and evidence of in-  stent restenosis on previous catheterization.   She also has a history of coronary artery disease with chronically  abnormal EKG, which shows deep anterolateral T-wave inversions.  Her  last catheterization was in May of this year, which was done for  preoperative screening.  She had a 75% lesion in her proximal LAD as  well as a 90% lesion in the distal LAD.  She had moderate nonobstructive  disease elsewhere.  Ejection fraction was normal.  We reviewed this with  the interventional team and thus thought that her coronary disease  should be managed medically.   She has recently moved to [place] and comes today for routine  followup.  She is overall doing fairly well.  She did notice on the way  up here that she had a little bit of dyspnea in the car.  This has been  going on for about 2 days.  She has had a cough of whitish sputum.  No  fevers or chills.  She denies any significant chest pain.  Unfortunately, she continues to smoke about a pack and half of  cigarettes a day.   She has been unable to afford her Plavix and TriCor, as she is in the  donut hole.   CURRENT MEDICATIONS:  1. Norvasc 10 a day.  2. Folic acid.  3. Metformin 1000 b.i.d.  4. Simvastatin 40 a day.  5. Coreg 18.75 b.i.d.  6. Hydralazine 100  b.i.d.  7. Lisinopril hydrochlorothiazide 20/12.5 a day.   PHYSICAL EXAMINATION:  GENERAL:  She is in no acute distress, ambulates  in the clinic without respiratory difficulty.  VITAL SIGNS:  Blood pressure initially on check-in was 150/58, on my  manual recheck was 195/74, heart rate 75, weight is 146.  HEENT:  Normal.  NECK is supple.  There is no JVD.  Carotids are 2+ bilaterally.  She has  a carotid endarterectomy scar on the left.  There are bilateral bruits.  No lymphadenopathy or thyromegaly.  CARDIAC:  PMI is nondisplaced.  She is regular with an S4.  No obvious  murmurs.  LUNGS:  Clear.  ABDOMEN:  Soft, nontender, nondistended hepatosplenomegaly, no bruits,  no masses.  Good bowel sounds.  EXTREMITIES:  Warm with no cyanosis, clubbing, or edema.  She does have  just a minimal sore on the lateral aspect of the left foot.  NEURO:  Alert and oriented x3.  Cranial  nerves II through XII are  intact.  Moves all 4 extremities without difficulty.  PSYCH:  Affect is pleasant.   ASSESSMENT AND PLAN:  1. Coronary artery disease.  This is stable.  No evidence of ongoing      ischemia.  Continue medical therapy.  2. Chronic hypertension.  Blood pressure is markedly elevated.  We      will go ahead and add spironolactone.  We will check a BMET in 1      week as well and again in 3 weeks.  3. Hyperlipidemia.  Goal LDL is less than 70.  We will recheck today      and titrate her statin as necessary.  4. Ongoing tobacco use.  Once again I counseled her on the need to      stop smoking.  5. Peripheral vascular disease.  This is followed by Dr. [name].  I      have asked her to make an appointment with him in the near future.     Shaune Pascal. Bensimhon, MD  Electronically Signed    DRB/MedQ  DD: 07/16/2008  DT: 07/17/2008  Job #: [id number]   cc:   [name]. [name], M.D.

## 2011-01-19 NOTE — Procedures (Signed)
DUPLEX ULTRASOUND OF ABDOMINAL AORTA   INDICATION:  Abdominal aortic aneurysm.   HISTORY:  Diabetes:  Yes.  Cardiac:  No.  Hypertension:  Yes.  Smoking:  Yes.  Connective Tissue Disorder:  Family History:  Yes.  Previous Surgery:  Bilateral renal artery PTA/stents in 2004.   DUPLEX EXAM:         AP (cm)                   TRANSVERSE (cm)  Proximal             2.3 cm                    2.5 cm  Mid                  2.1 cm                    2.0 cm  Distal               3.5 cm                    3.4 cm  Right Iliac          1.0 cm                    1.1 cm  Left Iliac           1.0 cm                    1.0 cm   PREVIOUS:  Date:  01/23/2008 (renal Doppler)  AP:  3.72  TRANSVERSE:  3.37   IMPRESSION:  Aneurysm of the distal abdominal aorta with stable  measurements noted when compared to the previous exam.   ___________________________________________  [name]. [name], M.D.   CH/MEDQ  D:  03/25/2009  T:  03/26/2009  Job:  [id number]

## 2011-01-19 NOTE — Discharge Summary (Signed)
NAMEAKSHATA, [name]                 ACCOUNT NO.:  [id number]   MEDICAL RECORD NO.:  [id number]          PATIENT TYPE:  INP   LOCATION:  1501                         FACILITY:  [facility]   PHYSICIAN:  Vernell Leep, MD     DATE OF BIRTH:  13-Nov-1950   DATE OF ADMISSION:  11/01/2007  DATE OF DISCHARGE:  11/04/2007                               DISCHARGE SUMMARY   PRIMARY CARE PHYSICIAN:  Dr. Coralyn Mark.   GASTROENTEROLOGIST:  Waverly Ferrari, M.D.   VASCULAR SURGEON:  [name]. [name], M.D.   RENAL:  Louis Meckel, M.D.   DISCHARGE DIAGNOSES:  1. Acute sigmoid diverticulitis with ? localized microperforation.  2. Lower gastrointestinal bleed secondary to problem #1, resolved.  3. Anemia.  4. Acute on chronic kidney disease.  5. Hypertension.  6. Type 2 diabetes.  7. Coronary artery disease.  8. Possible bilateral carotid artery stenosis.  9. Abdominal aortic aneurysm.  10.Abuse: tobacco substance.  11.Dyslipidemia.   DISCHARGE MEDICATIONS:  1. Lisinopril/hydrochlorothiazide 20/25 mg, one p.o. daily.  2. Hydralazine 150 mg p.o. b.i.d.  3. Plavix 75 mg p.o. daily.  4. Hydroxyzine 25 mg p.o. daily at bedtime p.r.n.  5. Lopressor 150 mg p.o. twice daily.  6. Prilosec 20 mg p.o. daily.  7. Norvasc 10 mg p.o. daily.  8. Cipro 500 mg p.o. twice daily for 1 week.  9. Metronidazole 500 mg p.o. 3x daily for 1 week.  10.Zofran 4 mg p.o. q.8 hourly p.r.n.  11.Tylenol 650 mg p.o. q.4-6 hourly p.r.n.  12.Colace 100 mg p.o. twice daily.  13.Glipizide 5 mg p.o. daily.   MEDICATIONS DISCONTINUED:  Metformin.   PROCEDURES:  CT of the abdomen without contrast.  Impression, intact  infrarenal abdominal aortic aneurysm.  Extensive atherosclerotic changes  particularly of the iliac arteries.  Thickening of the mid to distal  descending colon without adjacent pericolic fat density may represent  findings due to previous or chronic colitis or possible diverticulitis.  Findings compatible  with acute diverticulitis or less likely colitis  involving the sigmoid colon.  1. Pelvic CT without contrast.  Impression, findings compatible with      acute sigmoid diverticulitis.  Acute ischemic colitis is felt to be      less likely consideration.   PERTINENT LABS:  Basic metabolic panel remarkable for BUN of 8,  creatinine of 1.55 today.  CBC, hemoglobin 9.8, hematocrit 28.5, white  blood cell 10, platelets 217, hemoglobin A1c of 5.7, TSH of 2.432, lipid  panel with cholesterol of 209, triglycerides 345, HDL 30, LDL 110, VLDL  69.  Urinalysis was negative for features of UTI.  No protein, blood.  CBC on admission with hemoglobin of 11.5, hematocrit of 33.1, white  blood cell 12.9, platelets 296.  Creatinine on admission 1.31.   CONSULTATIONS:  Gastroenterology, Dr. Jim Desanctis.   HOSPITAL COURSE AND PATIENT DISPOSITION:  Please refer to the history  and physical note for initial admission details.  In summary, [name]  is a pleasant 60 year old Caucasian female patient with extensive past  medical history as per the history and physical note.  She presented to  the emergency room with history of cramping lower abdominal pain  especially the left lower quadrant, bloody diarrhea and nausea.  Further  evaluation in the emergency room revealed the patient to be afebrile but  had abdominal tenderness mainly in the left lower quadrant and mildly in  the suprapubic region.  Further evaluation was suggestive of acute  sigmoid diverticulitis with question sinus tract.  The patient was  therefore admitted to the medical floor for further evaluation and  management.  The patient was also assessed to have lower  gastrointestinal bleed secondary to the acute diverticulitis.   1. Acute sigmoid diverticulitis with ? localized microperforation.      The patient was admitted to telemetry.  She was empirically placed      on IV ciprofloxacin and Flagyl.  She was initially placed on clear       liquid diet.  She was also provided pain medications.  GI was      consulted.  With these conservative measures the patient has      progressively continued to improve with no further nausea.  The      patient is tolerating advanced low-fat diet.  She has had BMs with      no further bleeding.  Her abdominal pain is resolved except when      palpated.  She is afebrile.  Her white cell count has normalized.      Gastroenterology have indicated that she might be able to go home      today and follow up with the gastroenterologist in 4 weeks' time as      an outpatient for further workup as an outpatient.  We will await      GI followup prior to discharge this morning.  2. Lower gastrointestinal bleed.  Her H and H's were frequently      checked and have dropped slightly but have stabilized.  The patient      has not required transfusions.  There is no further gross bleeding      in the stool.  Her CBC is to be followed up in a week's time as an      outpatient.  3. Anemia.  Management per problem #2.  4. Acute on chronic kidney disease.  Her creatinines have slightly      trended upward from 1.3 to 1.55 despite being on IV fluids.  To      repeat her BMET in the next 4 or 5 days and follow up.  The patient      also is to follow up with her renal physician as an outpatient.  5. Hypertension controlled on above medications/home meds.  6. Diabetes controlled on metformin and sliding scale insulin in the      hospital.  However, will hold metformin at this time secondary to      her creatinine being elevated and will place on glipizide.  The      patient has been counseled regarding hypoglycemic symptoms and      management.  7. Coronary artery disease which is asymptomatic and stable.  Plavix      was held secondary to lower gastrointestinal bleed which will be      resumed.  8. Possible carotid artery stenosis bilaterally.  The patient had      bilateral carotid bruits on admission.  She  sees Dr. [name] and is      to follow up maybe with carotid Dopplers.  This is asymptomatic at      this time.  9. Abdominal aortic aneurysm.  Again, to follow up with Dr. [name] as      an outpatient.  10.Abuse of tobacco & substance.  The patient has been counseled.  She      was placed on nicotine patch here.  The patient, however, declined      the patch for home.  She says she and her husband will have to      together quit smoking and will see their physicians.      Vernell Leep, MD  Electronically Signed     AH/MEDQ  D:  11/04/2007  T:  11/04/2007  Job:  [id number]   cc:   Lanice Shirts, M.D.   Waverly Ferrari, M.D.  Fax: [place] [name], M.D.  [address]  [place]  [place] [address]   Louis Meckel, M.D.  Fax: [phone]

## 2011-01-19 NOTE — Assessment & Plan Note (Signed)
OFFICE VISIT   MOXIE, FORGACS  DOB:  1950/10/06                                       02/13/2008  [id number]   The patient underwent left carotid endarterectomy for severe but  asymptomatic left internal carotid stenosis on May 29.  She has done  very well with no neurologic complications or specific complaints.  She  has had no hoarseness or dysphasia and denies any hemispheric or  nonhemispheric TIAs, amaurosis fugax, diplopia, blurred vision or  syncope.  She has some mild hoarseness anterior to the incision as one  would expect.   PHYSICAL EXAMINATION:  Vital signs:  On exam blood pressure 146/76,  heart rate 68, respirations 14.  Neck:  Left neck incision has healed  nicely.  Carotid pulses were 3+ with no audible bruits.  Neurological:  Normal.   She was reassured regarding her progress.  She will now proceed with PTA  and stenting of her left anterior descending artery by Dr. Haroldine Laws in  the near future.  Following that she will call our office to arrange for  PTA of a stenotic left renal artery stent by Dr. Idelle Crouch D. [name], M.D.  Electronically Signed   JDL/MEDQ  D:  02/13/2008  T:  02/14/2008  Job:  [id number]

## 2011-01-19 NOTE — Assessment & Plan Note (Signed)
OFFICE VISIT   Amy, [name]  DOB:  1951-06-14                                       01/23/2008  [id number]   The patient returns today for further followup regarding her carotid  occlusive disease and her previous renal stenting procedures.  She has  had bilateral renal stents placed in the past and we have followed her  with duplex scanning.  She has had no change in her hypertension or  renal function and today the duplex scan reveals no severe stenotic  lesions either within the stents or adjacent to the stents bilaterally.  The kidneys were measured and equal lengths and resistive indexes were  normal, and there was no stenosis greater than 60% noted.  She is also  being followed for a moderate carotid occlusive disease.  She denies any  hemispheric or non-hemispheric TIAs, amaurosis fugax, diplopia, blurred  vision or syncope.  Of significance is the fact that she is being  followed now by Dr. Pierre Bali whom she saw for the first time  earlier today.  She has not been having any chest pain, dyspnea on  exertion, PND, or orthopnea, but he is planning a cardiac workup for  her.  She has a known mid LAD stenosis from previous cardiac cath by Dr.  Tami Ribas several years ago but has not had interventions performed and  has had no history of myocardial infarction.  She does occasionally have  a hot flushing sensation in the lower abdomen in both inguinal areas  extending into the proximal thighs which makes her feel unstable but  then this passes fairly rapidly.  She has a known thrombosed right  femoral popliteal bypass graft from many years ago, which has been very  stable and she has no severe claudication in the right leg, has some  mild left calf claudication symptoms.   PHYSICAL EXAM:  Blood pressure 150/60, heart rate 78, respirations 14.  She is alert and oriented x3.  Neck is supple with 3+ carotid pulses.  There is a high-pitched bruit  on the left side, a softer bruit on the  right.  Neurologic:  Normal.  No palpable adenopathy in the neck.  Chest:  Clear to auscultation.  Cardiovascular:  Regular rhythm no  murmurs.  Abdomen is soft, nontender with no palpable masses.  She has  3+ femoral pulses bilaterally, well-perfused lower extremities.  Carotid  duplex exam reveals severe progression of disease on the left side with  a 95% left internal carotid stenosis and 60% right internal carotid  stenosis.  This is much more severe than it was in 2007, which is the  last time we checked her carotids.   I have recommended that we proceed with a left carotid endarterectomy in  the very near future, even though it is asymptomatic.  I discussed this  with Dr. Haroldine Laws today, who will plan a cardiac cath within the next  few days and we will then proceed as indicated either with a sole left  carotid after the an intervention is performed, or possibly in  conjunction with coronary artery bypass grafting if that is indicated.  She is tentatively scheduled for Friday the 29th of May for her left  carotid surgery.   [name], M.D.  Electronically Signed   JDL/MEDQ  D:  01/23/2008  T:  01/24/2008  Job:  [id number]

## 2011-01-19 NOTE — Procedures (Signed)
CAROTID DUPLEX EXAM   INDICATION:  Followup, known carotid artery disease.   HISTORY:  Diabetes:  Yes.  Cardiac:  No.  Hypertension:  Yes.  Smoking:  Yes.  Previous Surgery:  Bilateral renal artery PTA and stent.  CV History:  Amaurosis Fugax No, Paresthesias No, Hemiparesis No                                       RIGHT             LEFT  Brachial systolic pressure:  Brachial Doppler waveforms:  Vertebral direction of flow:        Antegrade         Not well  visualized  DUPLEX VELOCITIES (cm/sec)  CCA peak systolic                   78                74  ECA peak systolic                   117               [id number]  ICA peak systolic                   199               [id number]  ICA end diastolic                   58                168  PLAQUE MORPHOLOGY:                  Calcified         Calcified  PLAQUE AMOUNT:                      Moderate          Severe  PLAQUE LOCATION:                    ICA, ECA          ICA, ECA   IMPRESSION:  1. 80-99% stenosis noted in the left internal carotid artery.  2. 40-59% stenosis noted in the right internal carotid artery.  3. Antegrade right vertebral artery.   ___________________________________________  [name] [name], M.D.   MG/MEDQ  D:  01/23/2008  T:  01/23/2008  Job:  [id number]

## 2011-01-19 NOTE — H&P (Signed)
[name], [name]                 ACCOUNT NO.:  [id number]   MEDICAL RECORD NO.:  [id number]           PATIENT TYPE:   LOCATION:                                 FACILITY:   PHYSICIAN:  [name]. [name], M.D.  DATE OF BIRTH:  1951/04/14   DATE OF ADMISSION:  DATE OF DISCHARGE:                              HISTORY & PHYSICAL   CHIEF COMPLAINT:  Severe left internal carotid stenosis - asymptomatic.   HISTORY OF PRESENT ILLNESS:  This 60 year old female patient with a long  history of diffuse vascular occlusive disease has had her carotid  disease followed by serial duplex scanning and upon her return was found  to have severe progression of her left internal carotid stenosis to 90%  in severity.  She denies any hemispheric or non-hemispheric TIAs,  amaurosis fugax, diplopia, blurred vision or syncope.  She was evaluated  by Dr. Glori Bickers for cardiac clearance because of a history of  coronary artery disease with no myocardial infarction.  She denies any  chest pain, dyspnea on exertion, PND or orthopnea.  She did have a known  mid LAD stenosis when studied by Dr. Tami Ribas several years ago and a  repeat cardiac cath by Dr. Haroldine Laws last week revealed no change in  this lesion which was not flow reducing or causing ischemia and he felt  that carotid endarterectomy could be performed safely prior to any  further treatment of her coronary artery disease.   PAST MEDICAL HISTORY:  1. Status post bilateral renal artery stenosis with mild recurrent in-      stent stenoses.  2. Coronary artery disease, stable.  3. Hypertension.  4. Hyperlipidemia.  5. Gastroesophageal reflux disease.  6. Hiatal hernia.  7. Diabetes mellitus non-insulin dependent.   PAST SURGICAL HISTORY:  1. Hysterectomy.  2. Right femoral-popliteal bypass graft.  3. Right hand surgery.  4. The patient has had cardiac catheterization on multiple occasions      and renal PTAs bilaterally x2.   FAMILY HISTORY:   Positive for abdominal aortic aneurysm in her mother,  coronary artery disease in her father and brother.  Negative for  diabetes.   SOCIAL HISTORY:  She is married, has 2 children and is retired.  She  smokes one-half pack cigarettes per day.  Does not use alcohol.   REVIEW OF SYSTEMS:  Occasionally notices palpitations in her chest with  some mild dyspnea on exertion but not severe.  Has history of reflux  esophagitis, hiatal hernia, abdominal discomfort on occasion with  occasional diarrhea.  Has occasional headaches, muscle pain, depression,  and nervousness.   ALLERGIES:  In the past has been thought to be possibly allergic to IV  CONTRAST although has not had any recent reactions with contrast.   MEDICATIONS:  1. Hydralazine 50 mg three times a day.  2. Metoprolol 100 mg one-half tablets twice a day.  3. Metformin 1000 mg one twice a day.  4. Plavix 75 mg once a day.  5. Lisinopril/hydrochlorothiazide 20 mg once a day.  6. Amlodipine 10 mg once a day.  7.  Omeprazole 20 mg once a day.  8. Simvastatin 20 mg once a day.   PHYSICAL EXAM:  VITAL SIGNS:  Blood pressure is 150/60, heart rate 78,  respirations 14.  GENERAL:  She is alert and oriented x3.  NECK:  Supple, 3+ carotid pulses are palpable.  High-pitched bruit on  the left side over the carotid bifurcation and there is a softer bruit  on the right.  NEUROLOGIC:  Neurologic exam is normal.  No palpable adenopathy in the  neck.  CHEST:  Clear to auscultation.  CARDIOVASCULAR:  Regular rate and rhythm.  No murmurs.  ABDOMEN:  Soft, nontender with no masses.  She has 3+ femoral pulses  bilaterally with well-perfused lower extremities.   LABORATORY DATA:  Carotid duplex exam at [facility] office on Jan 23, 2008  revealed a 90% left internal carotid stenosis and approximate 60% right  internal carotid stenosis.  There is some mild recurrent renal artery  stenoses by abdominal scanning.   IMPRESSION:  1. Severe asymptomatic  left internal carotid stenosis.  2. Coronary artery disease stable.  Recent cardiac catheterization      cleared by Dr. Haroldine Laws for surgery.   PLAN:  Admit the patient on Feb 02, 2008 for an elective left carotid  endarterectomy.  Risks and benefits have been thoroughly discussed with  the patient.  She would like to proceed.      [name] [name], M.D.  Electronically Signed     JDL/MEDQ  D:  01/31/2008  T:  02/01/2008  Job:  [id number]   cc:   Lanice Shirts, M.D.  Shaune Pascal. Bensimhon, MD

## 2011-01-19 NOTE — Procedures (Signed)
CAROTID DUPLEX EXAM   INDICATION:  Carotid disease.   HISTORY:  Diabetes:  No.  Cardiac:  CAD.  Hypertension:  Yes.  Smoking:  Yes.  Previous Surgery:  Left carotid endarterectomy on 02/02/2008.  CV History:  Currently asymptomatic.  Amaurosis Fugax No, Paresthesias No, Hemiparesis No                                       RIGHT             LEFT  Brachial systolic pressure:         182               141  Brachial Doppler waveforms:         Normal            Abnormal  Vertebral direction of flow:        Antegrade         Not visualized  DUPLEX VELOCITIES (cm/sec)  CCA peak systolic                   89                68  ECA peak systolic                   114               [id number]  ICA peak systolic                   180               156 (mid)  ICA end diastolic                   42                34  PLAQUE MORPHOLOGY:                  Heterogeneous     Heterogeneous  PLAQUE AMOUNT:                      Moderate          Moderate  PLAQUE LOCATION:                    ICA / ECA         ECA   IMPRESSION:  1. Doppler velocity suggests 40%-59% stenosis of the bilateral      internal carotid arteries.  However, the increased velocity of the      left mid internal carotid artery is most likely due to vessel      tortuosity.  2. Left external carotid artery stenosis noted.  3. No significant change noted when compared to the previous exam on      03/25/2009.   ___________________________________________  [name]. [name], M.D.   CH/MEDQ  D:  04/29/2010  T:  04/29/2010  Job:  [id number]

## 2011-01-19 NOTE — H&P (Signed)
[name], [name] NO.:  [id number]   MEDICAL RECORD NO.:  [id number]          PATIENT TYPE:  EMS   LOCATION:  ED                           FACILITY:  [facility]   PHYSICIAN:  Vernell Leep, MD     DATE OF BIRTH:  May 13, 1951   DATE OF ADMISSION:  11/01/2007  DATE OF DISCHARGE:                              HISTORY & PHYSICAL   PRIMARY MEDICAL DOCTOR:  Lanice Shirts, M.D.   GASTROENTEROLOGY:  Dr. Jim Desanctis   VASCULAR:  Dr. Tinnie Gens   RENAL:  Louis Meckel, M.D.   CHIEF COMPLAINT:  Left lower abdominal pain, bloody diarrhea, chills.   HISTORY OF PRESENTING ILLNESS:  [name] is a pleasant, 60 year old  Caucasian female patient with extensive past medical history.  She was  in her usual state of health until approximately 8 p.m. last night.  While at [facility] with her grandkids, patient experienced cramping left  lower abdominal pain, following which she had two episodes of muddy-  colored diarrhea with no frank blood. Subsequently, at 11 p.m. last  night, patient had severe abdominal cramping pain in the left lower  quadrant, which was 10/10, followed by first episode of diarrhea with  small clots of blood.  There was not much stool in this BM.  Since then,  she has had four to five episodes of loose stools with small clots of  blood.  The last one was 8:30 this morning.  The patient complained of  chills, but no fevers.  She complained of nausea, but no vomiting.  She  had an episode of tingling of her tongue and fingers and broke out in a  sweat during the first episodes of diarrhea at [facility], but since then,  no further such episodes, or light-headedness or dizziness.   PAST MEDICAL HISTORY:  1. Bilateral renal artery stenosis, status post stent with recurrent      in-stent stenosis, status post angioplasty in 2007.  2. Coronary artery disease/single-vessel disease.  3. Hypertension.  4. Hyperlipidemia.  5. Abdominal aortic  aneurysm (2.5 cm).  6. Peripheral vascular disease.  7. Gastroesophageal reflux disease.  8. Hiatal hernia.  9. Internal hemorrhoids.  10.Question hypercalcemia.  11.Diabetes mellitus.  12.Question newly-diagnosed chronic kidney disease.  13.Cardiac catheterization on May 18, 2006:  Significant for one-      vessel coronary artery disease, normal left ventricular systolic      function, moderate infrarenal aneurysm with diffuse atherosclerotic      disease of the abdominal aorta, 80% right renal artery in-stent      stenosis, 70% left renal artery in-stent stenosis.   PAST SURGICAL HISTORY:  1. Hysterectomy in 1998.  2. Right-wrist surgery in 1999.  3. Status post right femoral popliteal bypass in 1994.   ALLERGIES:  Patient indicates that she had itching after a study with IV  CONTRAST DYE in the past, but since then, she has had multiple studies  with IV dye with no reaction.  Patient did not get contrast during  today's study.   MEDICATIONS:  1. Lisinopril/hydrochlorothiazide 20/25 mg one  p.o. daily.  2. Hydralazine 150 mg p.o. twice daily.  3. Plavix 75 mg p.o. daily.  4. Hydroxyzine 25 mg p.o. q.h.s. p.r.n.   FAMILY HISTORY:  1. Brother, age 50 years, with CABG this year.  2. Mother died at age 58 from [place] aneurysm rupture.  3. Father died at age 54 years from massive heart attack.  4. Strong family history of vascular disease in grandparents, uncles      and dad.   SOCIAL HISTORY:  Patient is married and her spouse is at the bedside.  Patient worked as a Scientist, water quality in the past.  She is independent with  activities of daily living.  Patient has a 40 pack-year smoking history  and is a current smoker.  She also smokes marijuana.  No history of  alcohol abuse.   PHYSICAL EXAMINATION:  [name] is a moderately-built and nourished  female.  Patient was in mild intermittent painful distress, but no  respiratory distress.  VITAL SIGNS:  Temperature 98 degrees  Fahrenheit; Pulse: 89/min, blood  pressure 158/71 mmHg.  (Patient indicates that her blood pressure in the  left arm is always low, compared to the blood pressure in the right arm.  Question subclavian stenosis on the left, according to her vascular  surgeon.)  Respirations 16 per minute, saturating at 98% on room air.  HEENT:  Atraumatic, normocephalic.  Pupils equally reacting to light and  accommodation.  NECK:  Bilateral carotid bruits.  No JVD.  Supple.  LYMPHATICS:  No lymphadenopathy.  BREAST EXAM:  Deferred.  RESPIRATORY SYSTEM:  Clear to auscultation.  CARDIOVASCULAR SYSTEM:  First and second heart sounds heard.  No third  or fourth heart sounds or murmurs.  ABDOMEN:  Nondistended.  Tenderness in the left lower quadrant and mild  tenderness in the suprapubic region.  No rigidity, guarding or rebound.  No organomegaly or mass appreciated.  Bowel sounds are normally heard.  CENTRAL NERVOUS SYSTEM:  Patient is awake, alert, oriented times three  with no focal neurological deficits.  EXTREMITIES:  With no cyanosis, clubbing or edema.  Peripheral pulses  are symmetrically felt.  SKIN:  Without any rashes.  MUSCULOSKELETAL SYSTEM:  Unremarkable.   LABORATORY DATA:  Comprehensive metabolic panel with BUN of 13,  creatinine of 1.31, rest of it is unremarkable.  Coagulation indices are  normal.  CBC is with hemoglobin of 11.5, hematocrit 33.1, white blood  cells 4.9, platelets 296.  Urine analysis is negative for features of  urinary tract infection.   CT of the abdomen without contrast.  Impression:  Infrarenal abdominal  aortic aneurysm appears to be intact.  Extensive atherosclerotic  changes, particularly of the iliac arteries.  Thickening of the mid to  distal descending colon without adjacent paracolic pericolic fat density  may represent findings due to previous or chronic colitis or possible  diverticulitis.  Findings compatible with acute diverticulitis, or less  likely,  colitis involving the sigmoid colon.  Acute ischemic colitis is  felt to be less likely consideration.   Electrocardiogram with normal sinus rhythm at 79 beats per minute,  normal axis.  T-inversions in lateral leads and anteroseptal leads.  Otherwise, no acute changes.   ASSESSMENT AND PLAN:  1. Acute sigmoid diverticulitis, versus less likely etiology, colitis.      We will admit patient to medical floor.  We will obtain stool      studies, including culture, C. difficile and ova and parasites.  We      will  place on clear liquids p.o. and empiric IV antibiotics of      ciprofloxacin and Flagyl.  We will provide pain medications.  There      is a finding on the CT abdomen of possible interconnecting sinus      tract and hence we will obtain a GI consult from Dr. Lajoyce Corners.  2. Normocytic anemia.  We will follow CBCs closely and type and cross.  3. Leukocytosis, secondary to problem #1.  4. Hypertension, mildly uncontrolled.  Continue all patient's home      medications.  5. Diabetes.  Continue home medications and sliding scale insulin and      check hemoglobin A1c.  6. Coronary artery disease - asymptomatic.  7. Probable carotid artery stenosis.  For outpatient followup with Dr.      [name].  8. Abdominal aortic aneurysm.  Outpatient followup with Dr. [name].  9. Tobacco abuse.  For cessation counseling and a nicotine patch.  10.Substance abuse.  11.Lower GI bleed, secondary to problem #1.  Hemodynamically stable.      We will briefly hold Plavix and follow up hemoglobin and      hematocrit.      Vernell Leep, MD  Electronically Signed     AH/MEDQ  D:  11/01/2007  T:  11/01/2007  Job:  [id number]   cc:   Lanice Shirts, M.D.   Waverly Ferrari, M.D.  Fax: [place] [name], M.D.  [address]  [place]  [place] [address]   Louis Meckel, M.D.  Fax: [phone]

## 2011-01-19 NOTE — Assessment & Plan Note (Signed)
[facility]                            CARDIOLOGY OFFICE NOTE   [name], [name]                        MRN:          [id number]  DATE:01/23/2008                            DOB:          Jan 26, 1951    REFERRING PHYSICIAN:  Lanice Shirts, M.D.   REASON FOR ADMISSION:  Coronary artery disease and abnormal EKG.   HISTORY OF PRESENT ILLNESS:  [name] is a very pleasant 60 year old woman  with multiple medical problems including hypertension, hyperlipidemia,  diabetes, ongoing tobacco use, renal insufficiency and severe vascular  disease.  She is status post a failed fem-pop bypass on the right.  She  also has abdominal aortic aneurysm and bilateral renal artery stenosis  status post bilateral stenting, and carotid artery stenosis, which is  followed by Dr. Mamie Nick.   In September of 2007, she was admitted with chest pain and she had deep  anterolateral T wave inversions.  She underwent cardiac catheterization  by Dr. Tami Ribas.  This showed a 30% left main lesion.  There was a tandem  60 and 70% lesions in the mid LAD followed by a 90% tortuous stenosis in  the mid to distal LAD to circumflex, and a 60% lesion in the mid  section, and RCA had a 50% proximal lesion.  EF was 70%.  She did have  in-stent restenosis of the renal artery stents, and these were  angioplastied  again.  The coronary artery disease was treated  medically.   She essentially returns today to establish long term cardiology care.  She has had multiple problems over the last few months with recurrent  episodes of rectal bleeding due to diverticulosis.  She is contemplating  surgery for this.  Over the past few months, she noticed that she has  had some chest pain about one to two times per week.  This occurs  primarily at rest.  On exertion, she is mostly limited by claudication.  She said she can walk half of a block before she has to stop.  It is  worse on the left  than the right.  Occasionally, she said that her legs  get a warm feeling over them and feel very weak, almost like she is  going to fall.  She had an MRI about 10 years ago where there was  apparently some consideration of thecal compression.   Unfortunately, she continues to smoke 1 to 1-1/2 packs a day.  She is  trying to stop.   REVIEW OF SYSTEMS:  Notable for fatigue and occasional headaches.  She  said she snores heavily.  She has had a sleep study in the past.  She  said she was abnormal, but nothing was done about it.  She also has  arthritis pain, diabetes, anxiety, depression and reflux disease.  Remainder of review of systems is negative except for HPI and problem  list.   PROBLEM LIST:  1. Coronary artery disease as above.  2. Severe peripheral vascular disease.      a.     Status post right fem-pop bypass, which  has failed.      b.     Carotid artery stenosis.      c.     Abdominal aortic aneurysm.      d.     Bilateral renal artery stenosis status post stenting       complicated by in-stent restenosis and restenting.  This is all       followed by Dr. [name].  3. Diabetes.  4. Chronic renal insufficiency.  5. Ongoing tobacco use.  6. Hypertension.  7. Hyperlipidemia.  8. Gastroesophageal reflux disease.  9. Recurrent diverticulitis.   CURRENT MEDICATIONS:  1. Hydralazine 50 mg t.i.d.  2. Lisinopril hydrochlorothiazide 20/25 daily.  3. Metformin 1000 b.i.d.  4. Metoprolol 150 b.i.d.  5. Prilosec 20 daily.  6. Plavix 75 daily.  7. Amlodipine 10 daily.  8. Simvastatin 20 daily.   ALLERGIES:  None.   SOCIAL HISTORY:  She is married with two children.  She is on disability  for vascular disease.  She continues to smoke 1 to 1-1/2 packs a day and  does not drink alcohol.   FAMILY HISTORY:  Brother had coronary artery disease at age of around  27.  Mother died at 27 due to abdominal aneurysm.  Father died at 55 due  to a heart attack.  She has a sister who is  35, alive and well.   PHYSICAL EXAMINATION:  She is in no acute distress.  She ambulates  around the clinic without any respiratory difficulty.  Blood pressure is  174/68.  This is in the setting of not taking her medications this  morning.  Heart rate is 66.  Weight is 147.  HEENT:  Normal.  NECK:  Supple.  There is no JVD.  Carotids are 2+ bilaterally.  There  are bilateral bruits with a harsh bruit on the left.  CARDIAC:  PMI is nondisplaced.  She had a regular rate and rhythm, an  S4.  No murmur.  LUNGS:  Clear with no wheezing.  ABDOMEN:  Soft, nontender, nondistended.  There is no  hepatosplenomegaly, no bruits, no masses.  Good bowel sounds.  EXTREMITIES:  Warm with no cyanosis or clubbing.  No rash.  No edema.  DP pulses are 1+ on the right and faint on the left.  NEURO:  Alert and oriented x3.  Cranial nerves II-XII are intact.  Moves  all four extremities without difficulty.  Affect is pleasant.   EKG shows sinus rhythm with T wave inversions in V2 through V5.  These  are not as severe as previous.  Heart rate is 66.   ASSESSMENT/PLAN:  1. Coronary artery disease.  She has significant coronary artery      disease based on catheterization in 2007.  She is only mildly      symptomatic from this, mostly in part because she is limited by her      claudication.  Initially, my plan was to get an echocardiogram and      as long as her left ventricular function was normal, just to manage      her medically.  However, she now appears to have a very severe left      carotid stenosis according to Dr. [name], and will need an      endarterectomy.  As a Myoview is likely to be useless based on her      known left anterior descending disease, I think she will need a      cardiac catheterization for  preoperative evaluation.  We will      schedule that for this week.  She is on relatively good medical      therapy.  However, I think this needs to be a bit more aggressive      to really  get good control of her blood pressure and lipids.  2. Hypertension.  Blood pressure is significantly elevated in the      setting of taking her medications due to her appointment.  This is      followed by Dr. Coralyn Mark.  Could consider switching her metformin      over to Coreg, at least 12.5 b.i.d. and titrating upward to help      with her blood pressure.  3. Hyperlipidemia.  Goal LDL is less than 70.  We will probably need      to titrate her simvastatin upwards.  Once again, this is followed      by Dr. Coralyn Mark.  4. Peripheral artery disease as per Dr. [name].  5. Probable obstructive sleep apnea.  She will likely need a sleep      study in the future.  6. Tobacco use.  I counseled her on the need to stop smoking.  7. Chronic renal insufficiency.  This is fairly mild.  She will get      bicarbonate drip prior to her catheterization.   DISPOSITION:  Pending the results of her catheterization.     Shaune Pascal. Bensimhon, MD  Electronically Signed    DRB/MedQ  DD: 01/23/2008  DT: 01/23/2008  Job #: [id number]

## 2011-01-19 NOTE — Assessment & Plan Note (Signed)
OFFICE VISIT   [name], [name]  DOB:  01-25-1951                                       04/28/2010  [id number]   The patient returns today for her annual followup regarding her  abdominal aortic aneurysm, carotid occlusive disease and lower extremity  occlusive disease.  She has had no new abdominal symptoms although she  does continue to have burning discomfort in the back and hip areas  radiating into the thighs thought to be possibly due to spinal stenosis.  She denies any neurologic symptoms such as hemiparesis, aphasia,  amaurosis fugax, diplopia, blurred vision or syncope.  She is able to  ambulate moderate distances without severe claudication in her calves.  She recently had an episode of atrial fibrillation requiring  coumadinization and is back in a normal sinus rhythm now.   SOCIAL HISTORY:  She is widowed, has two children and is retired.  Has  not smoked since July of 2011.  Does not use alcohol.   REVIEW OF SYSTEMS:  Negative for chest pain, dyspnea on exertion.  Does  have history of atrial fibrillation as noted and has reflux esophagitis  with a hiatal hernia, joint pain.  All other symptoms are negative in  review of systems.   PHYSICAL EXAM:  Vital signs:  Blood pressure 166/74, heart rate 65,  temperature 97.8, respirations 14.  General:  A well-developed, well-  nourished female in no apparent distress, alert and oriented times 3.  HEENT:  Exam is normal for age.  EOMs intact.  Lungs:  Clear to  auscultation.  No rhonchi or wheezing.  Cardiovascular:  Regular rhythm,  no murmurs.  Carotid pulses 3+, no bruits.  Abdomen:  Soft, nontender  with a small pulsatile mass in the 3-4 cm range.  Lower extremity:  Exam  reveals 3+ femoral pulses.  No popliteal or distal pulses.  Both feet  are well-perfused.   Today I ordered a duplex scan of her aortic aneurysm which I reviewed  and interpreted and the aneurysm is stable at 3.5 cm in  maximum  diameter.  I also looked at the carotid duplex exam which I interpreted  and reveals mild left carotid occlusive disease bilaterally in the 40%-  50% range.  I also looked at her lower extremity arterial Dopplers, 0.79  on the right and 0.65 on the left which are stable.  She has known  occlusion of the right femoral-popliteal graft placed in 1994.   I discussed with her the fact that all of these situations are stable.  We will see her in 1 year in the vascular lab for further followup of  these problems unless she develops any new symptoms in the interim.     [name] [name], M.D.  Electronically Signed   JDL/MEDQ  D:  04/28/2010  T:  04/29/2010  Job:  [id number]   cc:   Shaune Pascal. Bensimhon, MD  Dr Coletta Memos

## 2011-01-19 NOTE — Procedures (Signed)
RENAL ARTERY DUPLEX EVALUATION   INDICATION:  Bilateral renal artery PTA and stent.   HISTORY:  Diabetes:  Yes.  Cardiac:  No.  Hypertension:  Yes.  Smoking:  Yes.   RENAL ARTERY DUPLEX FINDINGS:  Aorta-Proximal:  67 cm/s  Aorta-Mid:  88 cm/s  Aorta-Distal:  73 cm/s  Celiac Artery Origin:  SMA Origin:                                    RIGHT               LEFT  Renal Artery Origin:             Not well visualized 97 cm/s  Renal Artery Proximal:           187 cm/s            90 cm/s  Renal Artery Mid:                117 cm/s            158 cm/s  Renal Artery Distal:             99 cm/s             70 cm/s  Hilar Acceleration Time (AT):  Renal-Aortic Ratio (RAR):        2.7                 2.3  Kidney Size:                     11.56 cm            [id number] cm  End Diastolic Ratio (EDR):  Resistive Index (RI):            0.69                0.77   IMPRESSION:  1. Less than 60% stenosis noted in bilateral renal arteries.  2. Abdominal aortic aneurysm noted with a measurement of (3.72 cm x      3.37 cm).  3. Bilateral kidneys measured within normal limits.  4. Bilateral resistive index appears to be within normal limits.   ___________________________________________  [name] [name], M.D.   MG/MEDQ  D:  01/23/2008  T:  01/23/2008  Job:  [id number]

## 2011-01-19 NOTE — Op Note (Signed)
[name], [name]                 ACCOUNT NO.:  [id number]   MEDICAL RECORD NO.:  [id number]          PATIENT TYPE:  INP   LOCATION:  83                         FACILITY:  [place]   PHYSICIAN:  [name]. [name], M.D.  DATE OF BIRTH:  [birth year]   DATE OF PROCEDURE:  02/02/2008  DATE OF DISCHARGE:                               OPERATIVE REPORT   PREOPERATIVE DIAGNOSIS:  Severe left internal carotid stenosis -  asymptomatic.   POSTOPERATIVE DIAGNOSIS:  Severe left internal carotid stenosis -  asymptomatic.   OPERATION:  Left carotid endarterectomy with Dacron patch angioplasty.   SURGEON:  [name]. [name], MD.   FIRST ASSISTANT:  Judeth Cornfield. Scot Dock, MD   SECOND ASSISTANT:  Chad Cordial, PA   ANESTHESIA:  General endotracheal.   BRIEF HISTORY:  This patient with a history of vascular occlusive  disease including femoral-popliteal bypass grafting and asymptomatic  carotid disease, was found to have progression of disease on the left  side greater than 90% with a moderate right internal carotid stenosis.  She had cardiac catheterization by Dr. Haroldine Laws and we will require  PTCA and stenting of a moderate left anterior descending lesion in the  near future, but was scheduled initially for left carotid endarterectomy  for this asymptomatic lesion.   PROCEDURE:  The patient was taken to the operating room and placed in  the supine position at which time satisfactory general endotracheal  anesthesia was administered.  The left neck was prepped with Betadine  scrub and solution, draped in routine sterile manner.  Incision was made  along the anterior border of the sternocleidomastoid muscle and carried  down through subcutaneous tissue and platysma using the Bovie.  Common  facial vein and external jugular veins were ligated with 3-0 silk ties  and divided, exposing the common internal and external carotid arteries.  Care was taken not to injure the vagus or hypoglossal  nerves, both of  which were exposed.  There was calcified atherosclerotic plaque at the  carotid bifurcation extending up the internal carotid artery about 3 cm.  Distal vessel appeared normal.  A #10 shunt was prepared, and the  patient was heparinized.  The carotid vessels were occluded with  vascular clamps, longitudinal opening made in the common carotid with 15  blade extended up the internal carotid with Potts scissors to a point  distal to the disease.  The plaque was at least 90% stenotic in severity  with an ulcerative surface.  Distal vessel appeared normal with  excellent backbleeding.  A #10 shunt inserted without difficulty  reestablishing flow in about 2 minutes.  A standard endarterectomy was  then performed using the elevator and Potts scissors with an eversion  endarterectomy of the external carotid.  The plaque feathered off distal  internal carotid artery nicely, not requiring any tacking sutures.  Lumen was thoroughly irrigated with heparin saline.  All loose debris  carefully removed and arteriotomy was closed with a patch using  continuous 6-0 Prolene.  Prior to completion of the closure, the shunt  was removed after 30 minutes of shunt  time following antegrade and  retrograde flushing, the closure was completed reestablishment of flow,  initially up the external and up the internal branch.  Carotid was occluded for less than 2 minutes for removal of shunt.  Protamine was then given to reverse the heparin.  Following adequate  hemostasis, the wound was irrigated with saline, closed in layers with  Vicryl in a subcuticular fashion.  Sterile dressing was applied.  The  patient taken to recovery room in satisfactory condition.      [name] [name], M.D.  Electronically Signed     JDL/MEDQ  D:  02/02/2008  T:  02/02/2008  Job:  [id number]   cc:   Shaune Pascal. Bensimhon, MD

## 2011-01-22 ENCOUNTER — Ambulatory Visit (INDEPENDENT_AMBULATORY_CARE_PROVIDER_SITE_OTHER): Payer: Medicare Other | Admitting: *Deleted

## 2011-01-22 DIAGNOSIS — I4891 Unspecified atrial fibrillation: Secondary | ICD-10-CM

## 2011-01-22 NOTE — Op Note (Signed)
NAME:  [name], BEDNAREK                           ACCOUNT NO.:  [id number]   MEDICAL RECORD NO.:  [id number]                   PATIENT TYPE:  OIB   LOCATION:  2899                                 FACILITY:  [facility]   PHYSICIAN:  Amy Severe. Kellie [name], M.D.               DATE OF BIRTH:  1950/09/22   DATE OF PROCEDURE:  08/08/2003  DATE OF DISCHARGE:  08/08/2003                                 OPERATIVE REPORT   PREOPERATIVE DIAGNOSIS:  Renovascular hypertension, small abdominal aortic  aneurysm, right femoral popliteal occlusive disease.   POSTOPERATIVE DIAGNOSIS:  Renovascular hypertension, small abdominal aortic  aneurysm, right femoral popliteal occlusive disease.   PROCEDURE:  Abdominal aortogram with bilateral lower extremity run off via  left common femoral approach and bilateral selective renal angiography.   SURGEON:  Amy Severe. Kellie [name], M.D.   ANESTHESIA:  Local Xylocaine and Versed 2 mg intravenously.   CONTRAST:  165 mL.   COMPLICATIONS:  None.   PROCEDURE:  The patient was taken to the [facility] peripheral endovascular  lab and placed in the supine position at which time both groins were prepped  with Betadine scrubbing solution and draped in routine sterile manner.  After infiltration with 1% Xylocaine, the left common femoral artery was  entered percutaneously and a guide-wire was passed into the suprarenal aorta  under fluoroscopic guidance.  A 5 French sheath and dilator were passed over  the guide-wire and dilator removed.  A standard pigtail catheter was  positioned in the suprarenal aorta.  Flush abdominal aortogram was performed  injecting 20 mL of contrast at 20 mL per second.  This revealed the aorta to  be widely patent with single renal arteries bilaterally.  There was a small  abdominal aortic aneurysm measuring approximately 3 to 3.5 cm in diameter  and the inferior mesenteric artery appeared to be occluded.  There was an  ectatic plaque near the origin of the left  common iliac artery with either a  deep ulceration or aneurysmal changes, but both iliac systems were widely  patent in the common and external iliac arteries.  The left internal iliac  artery was occluded.  To get better views of the renal arteries bilaterally,  an RAO projection at 17 degrees was performed with magnified views and this  revealed an ostial stenosis on the right approximately 90% and approximately  85-90% left renal artery stenosis about 1 cm from the origin with an  irregular plaque.  Bilateral selective renal angiography was performed after  cannulating each with the RJ4 selective catheter with hand injections of 5-6  mL of contrast on each occasion and this better defined the renal artery  stenoses described above.  Following this, the RJ4 catheter was exchanged  for a pigtail catheter and was lowered into the abdominal aorta and  bilateral lower extremity run off performed injecting 88 mL of contrast at 8  mL per  second.  This revealed the iliac system to be patent as noted  previously with occlusion of the left internal iliac artery.  The right  internal iliac artery was patent.  Both external iliac arteries were widely  patent.  The right superficial femoral artery was occluded at its origin and  reconstituted in the distal thigh by collaterals with widely patent above  knee popliteal artery across the knee joint and three vessel run off on the  right.  The left common, superficial, and profunda femoris arteries were all  patent.  There was some mild to moderate narrowing in the left superficial  femoral artery in the mid thigh but no occlusion and there was a patent  popliteal artery with three vessel run off on the left.  Having tolerated  the procedure well, the pigtail catheter was removed over the guide-wire,  the sheath was removed, adequate compression applied, no complications  ensued.   FINDINGS:  1. Severe bilateral renal artery stenosis, both  approximating 90%.  2. Small abdominal aortic aneurysm.  3. Ulcerative plaque at the origin of the left common iliac artery.  4. Right superficial femoral occlusion with reconstitution of the above knee     popliteal artery and three vessel run off.  5. Diffuse mild to moderate left superficial femoral occlusive disease with     no occlusion and three vessel run off on the left.                                               [name], M.D.    JDL/MEDQ  D:  08/08/2003  T:  08/08/2003  Job:  [id number]

## 2011-01-22 NOTE — Cardiovascular Report (Signed)
NAMESHAMYIA, CUTCHALL                 ACCOUNT NO.:  [id number]   MEDICAL RECORD NO.:  [id number]            PATIENT TYPE:   LOCATION:                                 FACILITY:   PHYSICIAN:  Octavia Heir, MD       DATE OF BIRTH:   DATE OF PROCEDURE:  05/18/2006  DATE OF DISCHARGE:                              CARDIAC CATHETERIZATION   PROCEDURES:  1. Left heart catheterization.  2. Coronary angiography.  3. Left ventriculogram.  4. Abdominal aortogram.  5. Bilateral renal angiogram.   COMPLICATIONS:  None.   INDICATION:  Ms. Flegel is a 60 year old female patient of Dr. Concepcion Elk,  Dr, Minna Antis, with a history of hypertension, hyperlipidemia, a history of  peripheral artery disease, with known abdominal aneurysm, status post a  failed right fem-pop graft, a history of bilateral renal stenting, who was  admitted on 05/16/2006 with substernal chest pain which had been present for  approximately 12 hours.  She has had these episodes on and off for the last  several weeks.  She was subsequently admitted where she was seen in  consultation by Dr. Concepcion Elk and we are now asked to perform cardiac  catheterization to assess for any significant CAD.   DESCRIPTION OF OPERATION:  After getting informed consent, the patient was  brought to the Cardiac Catheterization Lab.  The right and left groin was  draped, prepped and draped in the usual sterile fashion, __________  monitoring  established.  Using a modified Seldinger technique, a #6 French  arterial sheaths were inserted in the right femoral artery.  A 6-French  diagnostic catheter was used to perform diagnostic angiography.   The left main is a large vessel which is calcified with 30% distal stenosis.   The LAD is a medium to large-sized vessel which courses around the apex and  gives rise to two diagonal branches.  This vessel is heavily calcified and  tortuous throughout its entire course.  There is 60% mid followed by a more  distal 70% mid vessel lesion.  There is a 90% early stenosis in the distal  mid portion of the  LAD.  Again, this is calcified and extremely tortuous.  The first and second diagonals were medium-sized vessels with no significant  disease.   The circumflex is a medium-sized vessel coursing the AV groove and it gives  rise to two obtuse marginal branches.  The circumflex is again noted to be  heavily calcified with a 60% mid-vessel stenosis after the takeoff of the  first OM.   The first OM is a medium-sized vessel which bifurcates in the mid segment  with no significant disease.   The second OM is a medium-sized vessel with no significant disease.   The right coronary artery is a large vessel which is dominant; it gives rise  to the PDA as well as the posterolateral branch.  Again, this vessel is  tortuous and heavily calcified.  There is diffuse 50% proximal disease.  The  remainder of the RCA has no significant disease.   The PDA  is a medium-sized vessel with no significant disease.   The PDLA is a medium-sized vessel which bifurcates in the mid segment with a  60% lesion in its ostial upper branch.   The left ventriculogram reveals a preserved EF of 70%.   Abdominal aortogram reveals moderate diffuse atherosclerosis of the  descending abdominal aorta.  There is a moderate infrarenal aneurysm  present.   The right renal stent is noted to have diffuse 80% instent restenosis with a  100 mm gradient pullback through the stent.  The left renal stent appears to  be have 70% diffuse instent restenosis with a 70 mm gradient throughout  pullback through the stent.   HEMODYNAMICS:  Systemic arterial pressure 163/66.  LV systemic pressure  165/2.  LVEDP 5.   CONCLUSIONS:  1. Significant one-vessel coronary artery disease involving a heavily      calcified and tortuous vessel.  2. Normal left ventricular systolic function.  3. Moderate infrarenal aneurysm with diffuse atherosclerosis  of the      abdominal aorta.  4. An 80% right renal artery instent restenosis with a 100 mm gradient.  5. A 70% left renal artery instent restenosis with a 70 mm gradient.  6. Systemic hypertension.      Octavia Heir, MD  Electronically Signed     RHM/MEDQ  D:  05/18/2006  T:  05/19/2006  Job:  [id number]   cc:   Freeman Caldron. Pauline Aus, M.D.  Tommy Medal, M.D.

## 2011-01-22 NOTE — Procedures (Signed)
DUPLEX ULTRASOUND OF ABDOMINAL AORTA   INDICATION:  Abdominal aortic aneurysm.   HISTORY:  Diabetes:  No.  Cardiac:  CAD.  Hypertension:  Yes.  Smoking:  Yes.  Connective Tissue Disorder:  Family History:  No.  Previous Surgery:  Bilateral renal artery stents in 2004.   DUPLEX EXAM:         AP (cm)                   TRANSVERSE (cm)  Proximal             2.0 cm                    2.0 cm  Mid                  2.3 cm                    2.3 cm  Distal               3.5 cm                    3.5 cm  Right Iliac          1.2 cm                    1.2 cm  Left Iliac           1.0 cm                    1.2 cm   PREVIOUS:  Date:  03/25/2009  AP:  3.5  TRANSVERSE:  3.4   IMPRESSION:  1. Aneurysmal dilatation of the distal abdominal aorta with no      significant change in maximum diameter noted.  2. There is an elevated velocity of 270 cm/second noted in the left      proximal common iliac artery.   ___________________________________________  [name] [name], M.D.   CH/MEDQ  D:  04/29/2010  T:  04/29/2010  Job:  [id number]

## 2011-01-22 NOTE — Discharge Summary (Signed)
NAME:  [name], [name]                           ACCOUNT NO.:  [id number]   MEDICAL RECORD NO.:  [id number]                   PATIENT TYPE:  INP   LOCATION:  3101                                 FACILITY:  [facility]   PHYSICIAN:  Cyril Mourning, D.O.                 DATE OF BIRTH:  1951-08-01   DATE OF ADMISSION:  10/16/2003  DATE OF DISCHARGE:  10/17/2003                                 DISCHARGE SUMMARY   PRIMARY CARE PHYSICIAN:  Dr. Tommy Medal.    Dr. [name]. [name].   ADMISSION DIAGNOSES:  1. Hypertensive urgency.  2. Chest pain.   DISCHARGE DIAGNOSES:  1. Hypertensive urgency, improved.  2. Chest pain, no evidence of acute myocardial infarction.  3. History of renal artery stenosis, status post stenting of her right renal     artery and an angioplasty with stenting of her left renal artery by Dr.     Tinnie Gens on August 22, 2003.  4. History of hypercholesterolemia.  5. Hypertension.  6. History of peripheral vascular disease with right femoropopliteal bypass     in 1994.  7. Status post hysterectomy in 1998.  8. History of right wrist surgical intervention in 1999 following trauma.  9. Abnormal chest x-ray in December of 2004 with recommendations for     followup.  Chest x-ray, AP, this admission, was not revealing of abnormal     findings.  PA and lateral was performed and results are to be reviewed by     her primary care physician, Dr. Minna Antis, when these are reported and     potentially an outpatient CT can be performed.  10.      Chronic renal insufficiency.  The patient's creatinine, this     admission, is 1.5; her creatinine back in December was 1.4.   MEDICATIONS ON DISCHARGE:  1. Hydrochlorothiazide.  2. Hydralazine, which is new, 25 mg 1 p.o. t.i.d.  3. Lopressor 150 mg 1 p.o. b.i.d., which is an increase from her previous     dose.  4. She is instructed to continue Lipitor and Norvasc; she had been     prescribed these by her primary care physician,  however, due to cost     concerns, the patient has been noncompliant with these medications.   Blood pressure control has been improved with the hydralazine, this  admission, and it is felt that these medications can be up-titrated by the  patient's primary care physician, for which she has a followup appointment  tomorrow.   HISTORY OF PRESENTING ILLNESS:  As the patient presented to Dr. Karlyn Agee, for full details, see the H&P.  Briefly, this is a 60 year old  female transferred from [facility] because of complaints of chest  tightness and elevated blood pressure.  She had bilateral stenting of both  renal arteries for renal artery stenosis and history of renovascular  hypertension, but  she had noted that she had elevated blood pressures at  home and in the emergency department, was noted to have a blood pressure of  237/123 at [facility].  She was transferred over to [facility]  secondary to this being where her physicians were.   HOSPITAL COURSE:  The patient was admitted to ICU.  She was started on  hydralazine, given labetalol with adequate control of her pressures and  resolution of her symptoms.  Her enzymes were negative; her EKG, however,  did reveal some anterolateral T wave inversions and suspected that she could  undergo an outpatient stress evaluation.  She is, as noted above, getting a  PA and lateral chest x-ray and   DICTATION ENDED AT THIS POINT.                                                Cyril Mourning, D.O.    ESS/MEDQ  D:  10/17/2003  T:  10/17/2003  Job:  [id number]   cc:   Tommy Medal, M.D.  [address]  [place], [place] [address]  Fax: [phone]

## 2011-01-22 NOTE — H&P (Signed)
[name], [name]                 ACCOUNT NO.:  [id number]   MEDICAL RECORD NO.:  [id number]          PATIENT TYPE:  INP   LOCATION:  1823                         FACILITY:  [facility]   PHYSICIAN:  Jacquelynn Cree, M.D.   DATE OF BIRTH:  01-15-1951   DATE OF ADMISSION:  05/16/2006  DATE OF DISCHARGE:                                HISTORY & PHYSICAL   PRIMARY CARE PHYSICIAN:  Tommy Medal, M.D.   CHIEF COMPLAINT:  Chest pain.   HISTORY OF PRESENT ILLNESS:  Patient is a 60 year old female with a past  medical history of multiple vascular conditions, including peripheral  vascular disease, renal artery stenosis, abdominal aortic aneurysm, as well  as hyperlipidemia, who presents with a 12 hour history of chest pain.  Patient states that the pain was present when she woke this morning.  She  has had the pain persistently since then.  She describes it as a nagging  ache and a cold feeling in her chest.  She denies any associated shortness  of breath.  She has had some episodic diaphoresis.  The pain has radiated  across her shoulder and down her left arm.  Interestingly, the patient has  been on Plavix but recently stopped this approximately one month ago  secondary to cost issues.  She does not consistently take a daily aspirin.   PAST MEDICAL HISTORY:  1. Hypertension.  2. Gastroesophageal reflux disease.  3. Dyslipidemia.  4. Hiatal hernia.  5. Internal hemorrhoids by colonoscopy in November, 2005.  6. Hysterectomy in 1998.  7. Wrist surgery on the right in 1999.  8. Renal artery stenosis, status post bilateral stents.  9. Abdominal aortic aneurysm, 2.5 cm.  10.Peripheral vascular disease, status post right femoral-popliteal bypass      in 1994.  11.History of negative stress test.   FAMILY HISTORY:  Patient's father died at 72 from an acute massive MI.  Mother died at 30 from a ruptured abdominal aortic aneurysm.  She also had  unspecified cardiac arrhythmias.  She had a  brother with paroxysmal atrial  fibrillation and hypertension.   SOCIAL HISTORY:  The patient is married.  She smokes about a pack of tobacco  daily and has done so for 32 years.  She denies alcohol.  She occasionally  smokes marijuana.  She is retired from Medical sales representative work.   ALLERGIES:  IV DYE.   MEDICATIONS:  1. Lasix 20 mg p.r.n.  2. Hydrochlorothiazide 25 mg daily.  3. Lopressor 150 mg b.i.d.  4. Zocor 40 mg daily.  5. Klonopin 1 mg p.r.n.  6. Hydralazine 50 mg b.i.d.  7. Nexium 40 mg daily.  8. Lopid 600 mg b.i.d.  9. Lisinopril 20 mg daily.   REVIEW OF SYSTEMS:  The patient denies any fevers or chills.  She has a  chronic cough that she relates is secondary to her smoking history.  She has  noticed an increase in claudication pain in her lower extremities.  She  denies any changes in her bowel habits.  No dysuria.  She has noticed some  drops in her blood pressure  lately when she checks it at home.   PHYSICAL EXAMINATION:  VITAL SIGNS:  Temperature 98.1, pulse 77,  respirations 24, blood pressure 151/76.  O2 saturation 100% on 2 liters.  GENERAL:  A well-developed and well-nourished female in no acute distress.  HEENT:  Normocephalic and atraumatic.  PERRL.  EOMI.  Oropharynx is clear.  NECK:  Supple.  No thyromegaly.  No lymphadenopathy.  No jugular venous  distention.  There is a prominent left-sided bruit.  HEART:  Regular rate and rhythm.  No murmurs, rubs or gallops.  ABDOMEN:  Soft, nontender, nondistended.  Normoactive bowel sounds.  EXTREMITIES:  No clubbing, cyanosis or edema.  SKIN:  Warm and dry.  No rashes.  NEUROLOGIC:  Patient is alert and oriented x3.  Cranial nerves II-XII are  grossly intact.  Nonfocal.   DATA REVIEW:  EKG shows a normal sinus rhythm with T wave inversions in I,  II, aVL, V2-5.   Chest x-ray shows no acute cardiopulmonary changes.  There are chronic  bronchitic changes that are mild.   LABORATORY DATA:  CBC shows a white blood cell  count of 7.6, hemoglobin  11.8, hematocrit 33.7, platelets 224 with an MCV of 97.7 and an ANC of 8.1.  Sodium is 140, potassium 3.8, chloride 111, bicarb 23, BUN 12, creatinine  1.5, glucose 115.  Point-of-care cardiac markers are negative x2.   ASSESSMENT/PLAN:  1. Chest pain, consistent with acute coronary syndrome in a patient with      multiple vasculopathies.  We will admit the patient to the telemetry      unit and monitor closely.  We will start her on therapeutic dose      Lovenox as well as IV nitroglycerin.  She is at very high risk for a      cardiac event.  Additionally, we will obtain a 2D echocardiogram and      cycle cardiac enzymes q.8h. x3.  Patient will likely need a cardiology      consultation in the morning for consideration of cardiac      catheterization.  2. Hypertension:  We will continue the patient's usual home medicines and      monitor her blood pressure closely.  3. Renal insufficiency:  The patient is mildly renally insufficient.      Unclear what her baseline is.  We will hold the Lasix for now and      monitor her renal function.  4. Gastroesophageal reflux disease:  We placed the patient on proton pump      inhibitor therapy.  5. Dyslipidemia:  We will check the patient's fasting lipid profile in the      morning and continue her statin.  6. Prophylaxis:  The patient will be on therapeutic-dose Lovenox which      should prevent problems with deep venous thrombosis.  Additionally, she      will be placed on a proton pump inhibitor.      Jacquelynn Cree, M.D.  Electronically Signed     CR/MEDQ  D:  05/16/2006  T:  05/16/2006  Job:  [id number]   cc:   Tommy Medal, M.D.

## 2011-01-22 NOTE — Op Note (Signed)
NAMECHINNA, HEIT                 ACCOUNT NO.:  [id number]   MEDICAL RECORD NO.:  [id number]          PATIENT TYPE:  AMB   LOCATION:  ENDO                         FACILITY:  [facility]   PHYSICIAN:  Waverly Ferrari, M.D.    DATE OF BIRTH:  27-Feb-1951   DATE OF PROCEDURE:  11/19/2005  DATE OF DISCHARGE:  11/19/2005                                 OPERATIVE REPORT   PROCEDURE:  Upper endoscopy with biopsy.   INDICATIONS:  GERD. Rule out Barrett's esophagus.   ANESTHESIA:  Demerol 60, Versed 7 milligrams and Robinul 0.2 milligrams was  given.   PROCEDURE:  With the patient mildly sedated in the left lateral decubitus  position the Olympus videoscopic endoscope was inserted the mouth and passed  under direct vision through the esophagus which appeared normal and I really  did not see clear-cut evidence of Barrett's esophagus at this point but  elected to biopsy around the squamocolumnar junction and entered into the  stomach. The fundus, body, antrum, duodenal bulb, second portion of duodenum  were visualized.  From this point the endoscope was slowly withdrawn taking  circumferential views of duodenal mucosa until the endoscope had been pulled  back into the stomach and placed in retroflexion to view the stomach from  below.  The endoscope was straightened and withdrawn taking circumferential  views remaining gastric and esophageal mucosa. The patient's vital signs,  pulse oximeter remained stable. The patient tolerated procedure well without  apparent complications.   FINDINGS:  Hiatal hernia with biopsies taken. Await biopsy report. The  patient will call me for results and follow-up with me as an outpatient.           ______________________________  Waverly Ferrari, M.D.     GMO/MEDQ  D:  11/19/2005  T:  11/21/2005  Job:  [id number]

## 2011-01-22 NOTE — H&P (Signed)
NAME:  [name], [name]                           ACCOUNT NO.:  [id number]   MEDICAL RECORD NO.:  [id number]                   PATIENT TYPE:  OIB   LOCATION:  NA                                   FACILITY:  [facility]   PHYSICIAN:  Mardene Celeste, N.P.            DATE OF BIRTH:  08-21-1951   DATE OF ADMISSION:  08/22/2003  DATE OF DISCHARGE:                                HISTORY & PHYSICAL   PRIMARY CARE PHYSICIAN:  Tommy Medal, M.D.   HISTORY OF PRESENT ILLNESS:  [name] is a 60 year old Caucasian female who  returned to the CVTS office and Dr. [name] on August 06, 2003, for  continued followup of renal artery occlusive disease and lower extremity  occlusive disease.  Last angiography was in 2001.  After examination of [name], Dr. [name] recommended proceeding with angiography to further  evaluation her renal artery disease and determine if she was a candidate for  PTA and stenting.  Her arteriogram was performed on December 2 and revealed  bilateral renal artery stenosis, left greater than right.  Dr. [name]  recommended proceeding with PTA and stenting.  This was scheduled for  August 22, 2003.   PAST MEDICAL HISTORY:  1. Known abdominal aortic aneurysm, approximately 2.5 cm.  This is stable.  2. Aortoiliac occlusive disease with left iliac stenosis and chronic right     superficial femoral artery occlusion.  She is status post right femoral-     to-above-the-knee-popliteal bypass in 1994 by Dr. [name].  3. Hypertension.  4. Dyslipidemia.   She specifically denies any history of diabetes, thyroid disease,  arrhythmia, or cancer.   PAST SURGICAL HISTORY:  1. Hysterectomy in 1998.  2. Repair of right wrist trauma in 1999.   ALLERGIES:  IV DYE, causing itchy feet.   MEDICATIONS PRIOR TO ADMISSION:  1. HCTZ 25 mg daily.  2. Lopressor 100 mg b.i.d.  3. Norvasc 10 mg daily.  4. Lipitor 10 mg daily.  5. Enteric-coated aspirin 325 mg 2 daily.   SOCIAL HISTORY:  [name] is married.  She has two adult children.  She is  disabled.  She continues to smoke tobacco at one pack per day, states that  she wants to quit, and she is beginning to work with Dr. Minna Antis.  She is  planning to start Wellbutrin.  She does not drink alcohol.  She lives with  her husband in a single-level dwelling,.  She drives.  She will also have  assistance from her husband, Jeneen Rinks, after discharge from the hospital.   REVIEW OF SYSTEMS:  GENERAL:  She has been feeling weak and fatigued since  her arteriogram on December 2.  She has had no recent change in vision or  hearing, difficulty chewing or swallowing.  She does report some dyspnea on  exertion.  She does have an occasional dry cough.  She attributes this to  smoking.  She does have daily fleeting chest pain.  This is relieved with  deep breathing.  She has had  20-pound weight loss over the last 12 months.  This has been intentional; however, she has been more active in taking care  of her two grandchildren.  No GU complaints.  No dizziness, syncope, and no  recent falls.  She does have generalized joint and muscle pain and aches.  She has bilateral claudication.  No recent fevers, no night sweats.  She  does report being a very anxious person.   PHYSICAL EXAMINATION:  VITAL SIGNS:  Blood pressure on the right 192/90, on  the left 190/92, heart rate 60.  Her height is approximately 5 feet 5 inches  tall.  She weighs 147 pounds.  GENERAL: A 60 year old Caucasian female in no acute distress.  HEENT:  Eyes are PERRLA.  EOMI.  Oral mucosa is pink and moist.  She does  have multiple teeth broken off at the gum line.  NECK:  Full range of motion.  No carotid bruits, thyromegaly, or  lymphadenopathy.  CHEST:  Breathing is unlabored.  Breath sounds are clear bilaterally.  HEART:  Regular rate and rhythm.  ABDOMEN:  Rotund with bowel sounds, soft and nontender.  EXTREMITIES:  Lower extremities are without edema, varicosities, or   venostasis changes.  She does have 2+ bilateral femoral pulses.  No distal  pulses are palpable.  She has no skin breakdown on her feet.  NEUROLOGIC:  Alert and oriented.  Cranial nerves II-XII grossly intact.  Her  gait is steady.  She does have full range of motion of all extremities.   LABORATORY DATA:  Pending  and will include CBC, CMET, PT, and INR.   IMPRESSION:  A 60 year old female with bilateral renal artery stenosis, left  greater than right, as well as uncontrolled hypertension.   PLAN:  1. A 23-hour admission to [facility] under the care of Dr. Mamie Nick on December 16 for bilateral renal artery PTA and stenting.                                                Mardene Celeste, N.P.    CTK/MEDQ  D:  08/20/2003  T:  08/20/2003  Job:  [id number]   cc:   Tommy Medal, M.D.  [address]  [place], [place] [id number]  Fax: [phone]

## 2011-01-22 NOTE — H&P (Signed)
NAME:  [name], [name]                           ACCOUNT NO.:  [id number]   MEDICAL RECORD NO.:  [id number]                   PATIENT TYPE:  INP   LOCATION:  3101                                 FACILITY:  [facility]   PHYSICIAN:  Karlyn Agee, M.D.              DATE OF BIRTH:  Feb 19, 1951   DATE OF ADMISSION:  10/16/2003  DATE OF DISCHARGE:                                HISTORY & PHYSICAL   PRIMARY CARE PHYSICIAN:  Tommy Medal, M.D.   CHIEF COMPLAINT:  Chest tightness and elevated blood pressure symptoms  today.   HISTORY OF PRESENT ILLNESS:  This is a 60 year old Caucasian lady  transferred from [facility] emergency room because of  the above complaints.  [name] recently had bilateral stenting of both  renal arteries, renal artery stenosis.  She is known to have hypertension,  difficult to control.  She checks her blood pressures morning and evening,  and systolics are always greater than 170/80's or 90's in the morning and  usually greater than [id number] systolic in the evening.  The patient has been  taking her Lopressor 100 mg twice a day regularly and hydrochlorothiazide 25  mg daily, but has not been taking Norvasc because of financial difficulties.   Today, the patient described that she felt funny.  She had a tight feeling  in her chest and neck.  The feeling she has had before, but what was new is  that she noticed blurring of the upper right portion of the visual field of  her left eye.  She called her primary care physician's office, and she was  advised to call 911.  The ambulance took her to the [facility] emergency room  where her blood pressures were recorded.  Mid-day, her blood pressure was  232/107, and over the four hours that she was there, her blood pressures  ranged from 237/123 to 171/87.  She received IV labetalol a total of 100 mg  IV.  She received sublingual nitroglycerin and nitroglycerin paste to the  chest.  She was transferred to our  intensive care unit in stable condition.   [name] denies any nausea or diaphoresis for palpitations.  Her baseline  is two-pillow orthopnea and occasional swelling of her feet for which she  takes Lasix about once per week.  She is unable to walk more than one-half  block because of severe peripheral vascular disease.  She denies any PND.  She denies fever, cough or cold.   She is a chronic smoker, one pack a day for 30 years and has tried  unsuccessfully to quit.  She denies alcohol and uses marijuana occasionally.  She has a family history of heart disease and hypertension.  Her brother has  hypertension and paroxysmal atrial fibrillation.  Her father died last week  of a massive MI at the age of 71.  Her mother is 57 and has cardiac  arrhythmias.  She is a retired from Medical sales representative work.   PAST MEDICAL HISTORY:  Significant for hypertension of long standing.  Status post stenting of both renal arteries August 21, 2001, by Dr.  [name].  Hyperlipidemia.  Triple A, 2.5 cm when last checked.  She is status  post right femoral above the knee popliteal bypass in 1994.   PAST SURGICAL HISTORY:  1. Hysterectomy in 1998.  2. Repair of right wrist status post trauma in 1999.   MEDICATIONS:  1. Lopressor 100 mg twice daily.  2. Hydrochlorothiazide 25 mg daily.  3. Lasix occasionally, once per week.  4. Lipitor.  She is noncompliant because of cost.  5. Norvasc 10 mg, noncompliant because of cost.   ALLERGIES:  She has a history of allergy to IV dye which causes itching of  her feet.   SOCIAL AND FAMILY HISTORY:  Refer to the H&P.   REVIEW OF SYSTEMS:  No further contributory.  No headaches, no seizures, no  syncope, no urinary disturbances.   PHYSICAL EXAMINATION:  GENERAL:  Comfortable and talkative, middle-age  Caucasian lady, lying in bed.  VITAL SIGNS:  Temperature 98.3, pulse between 66 and 80 at the time of  interview.  Blood pressure on the monitor between 220/77, and 170/80  on the  monitor.  Respiratory rate 20.  HEENT:  She is pink, she is anicteric.  Her pupils are equal and reactive.  Her extraocular muscles are intact.  There is no obvious jugular venous  distension.  There is no thyromegaly.  CHEST:  Clear to auscultation bilaterally.  CARDIOVASCULAR:  Regular rhythm, positive S4.  There is a 2/6 systolic  murmur of the left upper sternal border.  She has soft bilateral carotid  bruits.  ABDOMEN:  Soft, nontender.  EXTREMITIES:  No edema.  She has 2+ pulses bilaterally.   LABORATORY DATA:  No local labs currently, but labs sent with her from  [facility] show a glucose of 100, BUN of 19, and creatinine of 1.6.  Sodium, potassium, chloride, CO2, calcium, anion gap and osmolality are  essentially normal.  Cardiac enzymes, one set done, CK total 20.  CK-MB was  less than 0.5, and a troponin is less than 0.1.  All within normal range.  Hemoglobin and hematocrit 10.8 and 31.  She is mildly anemic.  Her white  count is normal at 8.5.  Her platelet count is normal at 255.  Her MCV is 90  and her RDW is 13.7.   Comparison with labs at admission in December showed she had at that time  hemoglobin and hematocrit normal at 12.5 and 36.9.  Her Chem-7 at that time  revealed a BUN of 12, and a creatinine of 1.4.   ASSESSMENT/PLAN:  1. A middle-aged lady admitted with hypertensive urgency, currently     comfortable.  2. Her blood pressure seem to be uncontrolled because of noncompliance     related to cost of medication.  She prefers generic medication.     Initially, we will start off by increasing her Lopressor to 150 mg twice     daily.  We will add hydralazine to her regimen.  3. We will complete her cardiac enzyme workup.  4. We will also get an ophthalmologist to evaluate her eye discomfort.  Karlyn Agee, M.D.   LC/MEDQ  D:  10/16/2003  T:  10/16/2003  Job:  [id number]

## 2011-01-22 NOTE — Discharge Summary (Signed)
NAMEJALANIE, [name]                 ACCOUNT NO.:  [id number]   MEDICAL RECORD NO.:  [id number]          PATIENT TYPE:  INP   LOCATION:  [id number]                         FACILITY:  [facility]   PHYSICIAN:  Vladimir Faster, MD        DATE OF BIRTH:  1951-01-13   DATE OF ADMISSION:  05/16/2006  DATE OF DISCHARGE:  05/25/2006                                 DISCHARGE SUMMARY   PRIMARY CARE PHYSICIAN:  Tommy Medal, M.D.   CONSULTS IN THE HOSPITAL:  1. Cardiology, Dr. Alla German.  2. CVTS, Dr. Tinnie Gens.   DISCHARGE DIAGNOSES:  1. Bilateral renal artery stenosis, which is end-stent stenosis, which has      been corrected with angioplasty.  2. 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  3. Hypertension.  4. Hyperlipidemia.  5. Abdominal aortic aneurysm.  6. Peripheral vascular disease.   DISCHARGE MEDICATIONS:  1. She will be sent home on hydrochlorothiazide 25 mg daily.  2. Lopressor 150 mg twice daily.  3. Zocor 40 mg daily.  4. Klonopin 1 mg as needed.  5. Hydralazine 50 mg twice daily.  6. Nexium 40 mg daily.  7. Lopid 600 mg twice daily.  8. Lisinopril 20 mg daily.  9. Plavix 75 mg daily.  10.Aspirin 350 mg daily.  11.Imdur 60 mg daily.  12.Norvasc 10 mg daily.   PROCEDURES IN THE HOSPITAL:  She had a cardiac cath on May 18, 2006  which showed triple vessel disease which was moderate.  She also had a  tortuous left main.  The cardiac cath also showed moderate infrarenal artery  aneurysm, diffuse atherosclerosis, and right renal artery showed 80% end-  stent stenosis and left renal artery showed 70% end-stent stenosis.   She had an echocardiogram done on May 17, 2006 which showed overall  left ventricular systolic function was normal with an ejection fraction of  60%.  There was focal basal septal hypertrophy.   [name] is a 60 year old lady who has a history of multiple vascular  problems in the past, including peripheral  vascular disease, bilateral renal  artery stenosis, which is stented in the past, abdominal aortic aneurysm,  who came in with a 12-hour history of chest pain.  She was on Plavix in the  past, but she had stopped taking Plavix a month before coming to the  hospital.  She was admitted to the hospital with possible acute coronary  syndrome.  Serial cardiac enzymes were done, which were negative, and we  obtained a 2D echocardiogram.  Cardiology was consulted.  Dr. Tami Ribas did a  heart cath on her, which showed moderate triple vessel disease and also a  tortuous narrowing in the LAD.  He discussed it with his colleagues, and he  decided that this would best be managed medically.  The cardiac cath also  showed 80% and 70% stenosis in both renal arteries.  At that point in time,  her ACE inhibitor was stopped.  Her renal function started to deteriorate,  and her creatinine went  up to 2.  She also got dye with the heart cath.   Dr. [name], [facility] surgeon, was consulted.  He went ahead and did angioplasty  on both of the renal arteries and opened them up.  Since then, her  creatinine has started to drop, and she will be discharged home in stable  condition.   DISCHARGE INSTRUCTIONS:  1. Activity to tolerance.  2. Diet:  Regular diet.  3. She will follow up with her primary care doctor, Dr. Minna Antis, in about two      weeks.  I have instructed her to call Dr. Minna Antis to get an appointment.      She will also keep a regular appointment with the cardiologist.      Vladimir Faster, MD  Electronically Signed     PKN/MEDQ  D:  05/24/2006  T:  05/24/2006  Job:  [id number]   cc:   Tommy Medal, M.D.

## 2011-01-22 NOTE — Consult Note (Signed)
NAMETYJAH, MULCARE                 ACCOUNT NO.:  [id number]   MEDICAL RECORD NO.:  [id number]          PATIENT TYPE:  INP   LOCATION:  [id number]                         FACILITY:  [facility]   PHYSICIAN:  [name]. [name], M.D.  DATE OF BIRTH:  Jan 12, 1951   DATE OF CONSULTATION:  05/18/2006  DATE OF DISCHARGE:                                   CONSULTATION   CHIEF COMPLAINT:  Recurrent bilateral renal artery stenoses status post PTA  and stenting in 2004.   HISTORY OF PRESENT ILLNESS:  This 60 year old female patient is well known  to me having had multiple vascular procedures and office visits in the past.  She is status post right femoral-popliteal bypass graft, bilateral renal  artery PTA and stenting by me in December 2004, and I have been following  her for small abdominal aortic aneurysm (3.2 cm) as well as some mild to  moderate left superficial femoral occlusive disease with left calf  claudication and some asymptomatic carotid occlusive disease with 60 to 70%  left internal carotid stenosis. She was admitted with chest pain. Had  cardiac catheterization today which revealed recurrent severe in-stent  stenoses in both renal arteries both in the 80% range the right worse than  left with significant gradients across the stents. She also had some diffuse  coronary artery disease. She has been having high blood pressure over the  last several months and has a history of renal insufficiency with creatinine  1.4.   PAST MEDICAL HISTORY:  1. Hypertension.  2. Gastroesophageal reflux.  3. Dyslipidemia.  4. Hiatal hernia.  5. Status post hysterectomy in 1998.  6. Wrist surgery in 1999.  7. 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  8. Small abdominal aortic aneurysm.  9. Lower extremity occlusive disease.   FAMILY HISTORY:  The patient's father died at age 74 from an MI and mother  died at 64 from a ruptured abdominal aortic aneurysm.   SOCIAL HISTORY:  The  patient is married. Continues to smoke about a pack of  cigarettes per day for the past 30 years or so. Did not use alcohol.   ALLERGIES:  IV DYE she states.   PHYSICAL EXAMINATION:  VITAL SIGNS:  Blood pressure 151/76, heart rate 77,  respirations 14.  GENERAL:  She is a middle-aged female in no apparent distress. Alert and  oriented x3.  NECK:  Supple with 3+ carotid pulses, soft bruit on the left.  NEUROLOGIC:  Normal.  CHEST:  Clear to auscultation.  ABDOMEN:  Soft, nontender with no masses.  EXTREMITIES:  Right leg has 3+ femoral, popliteal, and dorsalis pedis pulse  palpable. Left leg has 2+ femoral and 1 to 2+ popliteal, 1+ dorsalis pedis  pulse palpable. Both feet are well-perfused.   IMPRESSION:  Recurrent severe bilateral renal artery stenoses -  in-stent.   RECOMMENDATIONS:  The patient needs repeat PTA of these in-stent stenoses to  try to resolve this problem with her hypertension and renal insufficiency.  Would like to wait until her cardiac status is stabilized, and the plan has  been determined  for cardiac management and will then proceed as indicated.           ______________________________  [name]. [name], M.D.     JDL/MEDQ  D:  05/18/2006  T:  05/19/2006  Job:  [id number]   cc:   Tommy Medal, M.D.  Octavia Heir, MD

## 2011-01-22 NOTE — Op Note (Signed)
[name], [name]                 ACCOUNT NO.:  [id number]   MEDICAL RECORD NO.:  [id number]          PATIENT TYPE:  AMB   LOCATION:  ENDO                         FACILITY:  [facility]   PHYSICIAN:  Waverly Ferrari, M.D.    DATE OF BIRTH:  [birth year]   DATE OF PROCEDURE:  07/09/2004  DATE OF DISCHARGE:                                 OPERATIVE REPORT   PROCEDURE:  Colonoscopy.   INDICATIONS:  Cancer screening.   ANESTHESIA:  Demerol 75 mg and Versed 4.5 mg.   DESCRIPTION OF PROCEDURE:  With the patient mildly sedated in the left  lateral decubitus position, the Olympus videoscopic pediatric colonoscope  PCF 160 was inserted in the rectum and passed under direct vision to the  cecum through a very tortuous sigmoid colon that was diverticular filled,  but probably held down by adhesions.  We were able to reach the cecum as  identified by ileocecal valve and base of cecum, both of which were  photographed.  From this point, the colonoscope was slowly withdrawn, taking  circumferential views of the colonic mucosa, taking extra care when we  pulled back through the sigmoid colon to view this as best we could.  Given  the adhesions and the thickening of this area, it was difficult to see it  fully, but no gross lesions were seen until we pulled back to the rectum,  which appeared normal.  It showed hemorrhoids on retroflexed view.  The  endoscope was straightened and withdrawn.  The patient's vital signs and  pulse oximetry remained stable.  The patient tolerated the procedure well  without apparent complications.   FINDINGS:  Internal hemorrhoids and a very tortuous sigmoid colon which was  difficult to advance through, but otherwise an unremarkable examination.   PLAN:  The patient will follow up with me on an as needed basis.  See the  endoscopy note for further details of followup.       GMO/MEDQ  D:  07/09/2004  T:  07/09/2004  Job:  [id number]

## 2011-01-22 NOTE — Op Note (Signed)
NAME:  [name], WAKEHAM                           ACCOUNT NO.:  [id number]   MEDICAL RECORD NO.:  [id number]                   PATIENT TYPE:  OIB   LOCATION:  6709                                 FACILITY:  [facility]   PHYSICIAN:  [name]. [name], M.D.               DATE OF BIRTH:  July 27, 1951   DATE OF PROCEDURE:  08/22/2003  DATE OF DISCHARGE:                                 OPERATIVE REPORT   PREOPERATIVE DIAGNOSIS:  Bilateral severe renal artery stenosis with  hypertension and mild renal insufficiency.   POSTOPERATIVE DIAGNOSIS:  Bilateral severe renal artery stenosis with  hypertension and mild renal insufficiency.   OPERATION PERFORMED:  1. Left renal percutaneous transluminal angioplasty with 5 x 15 Aviator     catheter.  2. Left renal percutaneous transluminal angioplasty and stenting with 6 x 18     Aviator system.  3. Right renal percutaneous transluminal angioplasty and stenting with a 5 x     12 mm Aviator.  4. Completion angiography of perirenal aorta.   SURGEON:  [name]. [name], M.D.   ASSISTANT:  Dorothea Glassman, M.D.   ANESTHESIA:  Local Xylocaine, Versed 2 mg intravenously.   CONTRAST:  90 mL.   COMPLICATIONS:  None.   DESCRIPTION OF PROCEDURE:  The patient was taken to the peripheral  endovascular cath lab and placed in supine position at which time both  groins were prepped with Betadine scrub and solution and draped in routine  sterile manner.  Left common femoral artery was entered percutaneously.  After infiltration with Xylocaine, guidewire passed into the suprarenal  aorta under fluoroscopic guidance.  A 6 [place] JR 4 guide catheter was then  utilized to selectively cannulate the left renal artery.  This was  unsuccessful and an IM guide catheter was utilized and the left renal artery  cannulated with a 0.014 stabilizer wire.  An angiogram was performed through  the guide catheter which revealed a 90% stenosis 1 cm distal to the origin  of the renal  artery.  It was calcified and severely stenotic and therefore  plan was made for predilatation of this lesion.  400,000 units of heparin  given intravenously and a 5 x 15 aviator catheter was positioned  appropriately and inflated at 6 atmospheres for 30 seconds and post PTA  angiogram was performed which improved the lesion but was a suboptimal  result.  It was then decided to proceed with stenting of this lesion and a 6  x 18 Aviator on [employer] system was utilized and the lesion was dilated and  stented at 8 atmospheres for 30 seconds.  Post stenting angiogram revealed  an excellent technical result with no residual stenosis.  Following this,  the right renal artery was cannulated using the same guide catheter with the  0.014 stabilizer wire and right renal angiogram performed which revealed an  80% ostial stenosis to the right renal  artery with a slightly smaller vessel  on the right side.  This lesion was primarily dilated and stented using a 5  mm x 12 mm Aviator on [employer] system.  This inflation was done at 8  atmospheres for 30 seconds with an excellent cosmetic result, no residual  stenosis.  Following completion of this, the guide catheter was removed and  exchanged for a standard pigtail catheter and a single perirenal aortogram  was performed injecting 20 mL of contrast at 20 mL per second.  This  revealed both renal arteries to be widely patent with no evidence of any  residual stenosis.  The pigtail catheter was then removed and following  correction of the ACT, the sheath was removed, adequate compression applied,  no complications ensued.   FINDINGS:  1. Severe bilateral renal artery stenosis 90% on the left and 80% on the     right.  2. Predilatation of left renal artery stenosis with 5 x 15 PTA catheter at 8     atmospheres for 30 seconds.  3. PTA and stenting of left renal artery with a 6 x 18 PTA catheter and     stent at 8 atmospheres for 30 seconds.  4. Primary  right renal PTA and stenting with a 5 mm x 12 mm PTA catheter and     stent at 8 atmospheres for 30 seconds.  5. Completion angiography of perirenal aorta.                                               [name] [name], M.D.    JDL/MEDQ  D:  08/22/2003  T:  08/22/2003  Job:  [id number]

## 2011-01-22 NOTE — Op Note (Signed)
NAMECHRISTINIA, POLICASTRO                 ACCOUNT NO.:  [id number]   MEDICAL RECORD NO.:  [id number]          PATIENT TYPE:  AMB   LOCATION:  ENDO                         FACILITY:  [facility]   PHYSICIAN:  Waverly Ferrari, M.D.    DATE OF BIRTH:  11/05/1950   DATE OF PROCEDURE:  DATE OF DISCHARGE:                                 OPERATIVE REPORT   PROCEDURE PERFORMED:  Upper endoscopy with biopsy.   ENDOSCOPIST:  Waverly Ferrari, M.D.   INDICATIONS FOR PROCEDURE:  Gastroesophageal reflux disease.   ANESTHESIA:  ___________ mg.   DESCRIPTION OF PROCEDURE:  With the patient mildly sedated in the left  lateral decubitus position, the Olympus videoscopic endoscope was inserted  in the mouth and passed under direct vision through the esophagus. There was  question of Barrett's esophagus although the esophagus would not open fully  and I could not see this clearly.  It was photographed.  I then obtained  biopsies of the __________.  We entered into the stomach.  The fundus, body,  antrum, duodenal bulb and second portion of the duodenum all appeared  normal.  From this point, the endoscope was slowly withdrawn taking  circumferential views of the duodenal mucosa until the endoscope was pulled  back into the stomach and placed on retroflexion to view the stomach from  below.  The endoscope was then straightened and withdrawn taking  circumferential views of the remaining gastric and esophageal mucosa.  The  patient's vital signs and pulse oximeter remained stable.  The patient  tolerated the procedure well without apparent complications.   FINDINGS:  Changes of distal esophagus biopsied to evaluate.  Await this  report.  Patient will call me for results and follow up with me as an  outpatient.  Proceed to colonoscopy.       GMO/MEDQ  D:  07/09/2004  T:  07/09/2004  Job:  [id number]

## 2011-01-22 NOTE — Op Note (Signed)
NAMEMONI, WHILEY                 ACCOUNT NO.:  [id number]   MEDICAL RECORD NO.:  [id number]          PATIENT TYPE:  INP   LOCATION:  [id number]                         FACILITY:  [facility]   PHYSICIAN:  [name]. [name], M.D.  DATE OF BIRTH:  15-Jan-1951   DATE OF PROCEDURE:  05/23/2006  DATE OF DISCHARGE:                                 OPERATIVE REPORT   PREOPERATIVE DIAGNOSIS:  Recurrent in-stent stenosis, bilateral renal  arteries (status post percutaneous transluminal angioplasty and stenting in  2004).   PROCEDURES:  1. Selective right renal angiogram with percutaneous transluminal      angioplasty of in-stent right renal artery stenosis using:  (a) A 4 x      15 Aviator inflated to 10 atmospheres for 33 seconds; and (b) a 5 x 15      Aviator inflated at 10 atmospheres for 34 seconds with completion right      renal angiogram.  2. Selective left renal angiogram with percutaneous transluminal      angioplasty of left renal in-stent stenosis using:  (a) 5 x 15 Aviator      inflated at 12 atmospheres for 40 seconds; and (b) 6 x 15 aviator      inflated at 10 atmospheres for 33 seconds with completion left renal      angiogram.  3. Abdominal aortogram.   SURGEON:  [name]. [name], M.D.   ANESTHESIA:  Local Xylocaine plus Versed 2 mg intravenously.   CONTRAST:  62 mL.   HEPARIN:  5000 units.   COMPLICATIONS:  None.   DESCRIPTION OF PROCEDURE:  The patient was taken to the [facility]  peripheral endovascular lab, placed in supine position, at which time both  groins were prepped with Betadine scrub and solution and draped in a routine  sterile manner.  After infiltration of 1% Xylocaine, the left common femoral  artery was entered percutaneously, a guidewire passed into the suprarenal  aorta under fluoroscopic guidance.  A 6-French sheath and dilator were  passed over the guidewire, dilator removed, and a standard IMA guide  catheter was used to cannulate initially the right renal  artery.  There were  stents in place in the proximal portion of both renal arteries.  A 0.014  stabilizer wire was then advanced through the IMA guide catheter into the  distal right renal artery and a right renal angiogram performed through the  guide catheter using 5 mL of contrast per injection.  This revealed a  recurrent stenosis approximating 80% within the stent.  Four thousand units  of heparin was given initially with a subsequent ACT of 221 and an  additional 1000 units of heparin was given following this with increase in  the ACT to 270.  The right renal artery in-stent stenosis was initially  dilated using a 5 x 15 Aviator angioplasty catheter inflated at 10  atmospheres for 33 seconds, and a repeat angiogram revealed some residual  stenosis which extended slightly distal to the stent.  A second inflation  was performed using a 5 x 15 Aviator catheter at 10 atmospheres for  34  seconds with an excellent cosmetic result within the stent but, as noted,  some narrowing continued slightly distal to the stent, but this was no  tighter than within the stent itself.  This was felt to be satisfactory and  following this, the left renal artery was cannulated using the IMA guide  catheter and at 0.014 stabilizer wire passed distally into the left renal  artery.  A left renal angiogram was performed, which revealed a 90% proximal  stenosis within the stent near the ostium.  This was dilated initially with  a 5 x 15 Aviator at 12 atmospheres for 40 seconds and when a completion  angiogram revealed some residual stenosis was then dilated with a 6 x 15  Aviator at 10 atmospheres for 33 seconds with an excellent cosmetic result.  Abdominal aortogram was then performed through a pigtail catheter injecting  only 15 mL of contrast, which revealed both renal arteries to be widely  patent in the stented areas with good flow distally.  Having tolerated the  procedure well, the sheath was removed  after the ACT was corrected, adequate  compression applied and No complications ensued.   FINDINGS:  1. Recurrent right renal artery stenosis within the previously-placed      stent at 80%.  2. Successful PTA within the right renal artery stent.  using initially a      4 x 15 Aviator catheter, followed by a 5 x 15 Aviator catheter.  3. Recurrent left renal artery stenosis within the previously-placed      stent, with successful dilatation of this area using initially a 5 x 15      Aviator, followed by a 6 x 15 Aviator, with excellent cosmetic result      on abdominal aortogram.           ______________________________  [name] [name], M.D.     JDL/MEDQ  D:  05/23/2006  T:  05/23/2006  Job:  [id number]

## 2011-01-22 NOTE — Discharge Summary (Signed)
NAMERUKSANA, OSINSKI                 ACCOUNT NO.:  [id number]   MEDICAL RECORD NO.:  [id number]          PATIENT TYPE:  INP   LOCATION:  [id number]                         FACILITY:  [facility]   PHYSICIAN:  Vernell Leep, MD     DATE OF BIRTH:  21-Apr-1951   DATE OF ADMISSION:  05/16/2006  DATE OF DISCHARGE:  05/25/2006                                 DISCHARGE SUMMARY   ADDENDUM:   PRIMARY CARE PHYSICIAN:  Tommy Medal, M.D.   For detailed discharge summary, please refer to Dr. Renford Dills discharge  summary of the May 24, 2006.  The patient today complained of some  soreness of both hips which is a chronic symptom and also a pain in the back  of her left upper leg and calf.  These symptoms going on for three days.  She says those symptoms were worse after walking and at night.  She thought  that maybe these were because of her constantly lying down in the hospital  bed.  Review of systems were negative for chest pain, dyspnea.   Vital signs were stable.  On examination of her extremities, there was no  calf swelling and tenderness and there were feeble dorsalis pedis palpable  in both lower extremities (the patient with peripheral vascular disease).   On reviewing her lab data, her creatinine was down to 1.7.  A Doppler of her  lower extremities was obtained to rule out DVT and there is no evidence of  deep vein thrombosis or superficial venous thrombosis and the patient is  stable to be discharged and followed up as an outpatient by her primary care  physician, Dr. Tommy Medal, whose number the patient has and she says she  will make the appointment.  I have also provided her with the number.  The  soreness of the left lower extremity probably a musculoskeletal etiology or  her peripheral vascular disease.      Vernell Leep, MD  Electronically Signed     AH/MEDQ  D:  05/25/2006  T:  05/25/2006  Job:  [id number]   cc:   Tommy Medal, M.D.

## 2011-01-27 ENCOUNTER — Emergency Department (HOSPITAL_BASED_OUTPATIENT_CLINIC_OR_DEPARTMENT_OTHER)
Admission: EM | Admit: 2011-01-27 | Discharge: 2011-01-27 | Disposition: A | Discharge status: Home / Self Care | Payer: Medicare Other | Attending: Emergency Medicine | Admitting: Emergency Medicine

## 2011-01-27 DIAGNOSIS — Z79899 Other long term (current) drug therapy: Secondary | ICD-10-CM | POA: Insufficient documentation

## 2011-01-27 DIAGNOSIS — E119 Type 2 diabetes mellitus without complications: Secondary | ICD-10-CM | POA: Insufficient documentation

## 2011-01-27 DIAGNOSIS — K047 Periapical abscess without sinus: Secondary | ICD-10-CM | POA: Insufficient documentation

## 2011-01-27 DIAGNOSIS — K219 Gastro-esophageal reflux disease without esophagitis: Secondary | ICD-10-CM | POA: Insufficient documentation

## 2011-01-27 DIAGNOSIS — I10 Essential (primary) hypertension: Secondary | ICD-10-CM | POA: Insufficient documentation

## 2011-02-08 ENCOUNTER — Encounter: Payer: Medicare Other | Admitting: *Deleted

## 2011-02-17 ENCOUNTER — Other Ambulatory Visit: Payer: Self-pay | Admitting: Internal Medicine

## 2011-02-19 ENCOUNTER — Encounter: Payer: Medicare Other | Admitting: *Deleted

## 2011-02-21 ENCOUNTER — Encounter: Payer: Self-pay | Admitting: Physician Assistant

## 2011-02-22 ENCOUNTER — Encounter: Payer: Self-pay | Admitting: Physician Assistant

## 2011-02-23 ENCOUNTER — Encounter: Payer: Medicare Other | Admitting: *Deleted

## 2011-02-24 ENCOUNTER — Encounter: Payer: Self-pay | Admitting: Physician Assistant

## 2011-02-25 ENCOUNTER — Telehealth: Payer: Self-pay | Admitting: Internal Medicine

## 2011-02-25 ENCOUNTER — Encounter: Payer: Self-pay | Admitting: Cardiovascular Disease

## 2011-02-25 NOTE — Telephone Encounter (Signed)
Pt in [facility] and bp low, was told she could go home tomorrow if dr bensimhon could see her, he's off tomorrow, can anyone else see her?

## 2011-02-25 NOTE — Telephone Encounter (Signed)
Pt sch to see Richardson Dopp tom at 11:30

## 2011-02-26 ENCOUNTER — Ambulatory Visit (INDEPENDENT_AMBULATORY_CARE_PROVIDER_SITE_OTHER): Payer: Medicare Other | Admitting: Physician Assistant

## 2011-02-26 ENCOUNTER — Encounter: Payer: Self-pay | Admitting: Physician Assistant

## 2011-02-26 ENCOUNTER — Ambulatory Visit (INDEPENDENT_AMBULATORY_CARE_PROVIDER_SITE_OTHER): Payer: Self-pay | Admitting: Cardiovascular Disease

## 2011-02-26 VITALS — BP 93/63 | HR 84 | Resp 14 | Ht 65.0 in | Wt 137.0 lb

## 2011-02-26 DIAGNOSIS — F17200 Nicotine dependence, unspecified, uncomplicated: Secondary | ICD-10-CM

## 2011-02-26 DIAGNOSIS — I509 Heart failure, unspecified: Secondary | ICD-10-CM

## 2011-02-26 DIAGNOSIS — I2510 Atherosclerotic heart disease of native coronary artery without angina pectoris: Secondary | ICD-10-CM

## 2011-02-26 DIAGNOSIS — R0989 Other specified symptoms and signs involving the circulatory and respiratory systems: Secondary | ICD-10-CM

## 2011-02-26 DIAGNOSIS — I739 Peripheral vascular disease, unspecified: Secondary | ICD-10-CM

## 2011-02-26 DIAGNOSIS — I959 Hypotension, unspecified: Secondary | ICD-10-CM | POA: Insufficient documentation

## 2011-02-26 DIAGNOSIS — I4891 Unspecified atrial fibrillation: Secondary | ICD-10-CM

## 2011-02-26 DIAGNOSIS — I5032 Chronic diastolic (congestive) heart failure: Secondary | ICD-10-CM

## 2011-02-26 DIAGNOSIS — I214 Non-ST elevation (NSTEMI) myocardial infarction: Secondary | ICD-10-CM

## 2011-02-26 LAB — CBC WITH DIFFERENTIAL/PLATELET
Basophils Absolute: 0 10*3/uL (ref 0.0–0.1)
HCT: 33.2 % — ABNORMAL LOW (ref 36.0–46.0)
Lymphs Abs: 1.1 10*3/uL (ref 0.7–4.0)
MCV: 92.3 fl (ref 78.0–100.0)
Monocytes Absolute: 0.8 10*3/uL (ref 0.1–1.0)
Platelets: 394 10*3/uL (ref 150.0–400.0)
RDW: 14 % (ref 11.5–14.6)

## 2011-02-26 LAB — BASIC METABOLIC PANEL
BUN: 37 mg/dL — ABNORMAL HIGH (ref 6–23)
CO2: 28 mEq/L (ref 19–32)
Chloride: 97 mEq/L (ref 96–112)
Glucose, Bld: 108 mg/dL — ABNORMAL HIGH (ref 70–99)
Potassium: 4.3 mEq/L (ref 3.5–5.1)

## 2011-02-26 LAB — POCT INR: INR: 3.4

## 2011-02-26 MED ORDER — FUROSEMIDE 40 MG PO TABS: 40.0000 mg | ORAL_TABLET | Freq: Every day | ORAL | Status: DC | PRN

## 2011-02-26 NOTE — Assessment & Plan Note (Signed)
Remains in [facility].  INR 3.4 today.  Follow up in 3 days.  INR probably up due to antibiotic (Levaquin).  Hold coumadin and recheck with coumadin clinic.

## 2011-02-26 NOTE — Patient Instructions (Addendum)
Your physician recommends that you schedule a follow-up appointment in: [place] 02/09/11 Monday PT WILL ALSO NEED A NURSE VISIT TO HAVE A  BLOOD PRESSURE CHECK THE SAME DAY AS PER SCOTT WEAVER, PA-C   Weigh yourself daily.   If your weight goes up 3 pounds or more in one day, take the Lasix 40 mg once that day. Call us if weight goes up 5 pounds or more in one day.  Your physician has requested that you have a lexiscan myoview [phone]. For further information please visit [email or web address]. Please follow instruction sheet, as given.  Your physician recommends that you return for lab work in: TODAY BMET 428.32, CBC W/DIFF [phone].[place]

## 2011-02-26 NOTE — Assessment & Plan Note (Signed)
States she quit again.

## 2011-02-26 NOTE — Assessment & Plan Note (Signed)
Followed by [facility] 

## 2011-02-26 NOTE — Assessment & Plan Note (Signed)
New diagnosis for her.  Volume stable now.  Reviewed echo report.  She has normal LVF and concentric LVH.  She will weigh daily.  I gave her Lasix 40 mg to take QD prn weight gain of 3 pounds or more.  She will call us to weight gain of 5 pounds or more.

## 2011-02-26 NOTE — Progress Notes (Signed)
History of Present Illness: Primary Cardiologist:  Dr. Glori Bickers  [name] is a 60 y.o. female with severe vascular disease followed by Dr. [name], COPD, HTN, CAD with borderline LAD lesion (manged medically), PAF and markedly abnormal ECG with persistent severe T wave inversions.   Has a h/o severe GIB in 10/11.  In early December, readmitted to [facility] for 3rd episode of AFib. Rate was controlled on admit.  Converted spontaneously after 30 hours. When in AF feels SOB even if slow.  On coumadin.   Had sleep study with AHI 7 and O2 desats. Unable to tolerate CPAP but wearing O2 at night.  Creatinine in 2.0 range in past and has been referred to nephrology.   She returns for follow up after being discharged from [facility] yesterday.  We requested the records and I finally got them.  It appears that she came in with acute respiratory failure.  She was diagnosed with acute bronchitis and was placed on antibiotics.  I suspect she had a COPD exacerbation.  She had congestive heart failure in the setting of her bronchitis and was diuresed.  Follow up CXR was negative for edema..  An echocardiogram demonstrated normal LV function with concentric LVH.  She also ruled in for NSTEMI with peak troponin of 1.24.  She developed hypotension and was told to hold all of her medications until follow up today.  INR was also supratherapeutic and was held.  INR today is 3.4.  She moved recently.  Has felt poorly since.  Developed chest pain last week.  Felt tight around her chest and lasted 30 minutes.  No pain since.  She developed increased dyspnea, orthopnea and PND prior to going to the hospital.  This improved with diuresis.  But, she still has some dyspnea and feels weak.  Notes lightheadedness with standing.  She has a significant drop in BP with standing today as well.  No syncope.  No cough.  Was wheezing.  Better now.  Finishes Levaquin soon.  Has a h/o anemia and Hgb was 9.2 at  [facility]  Past Medical History  Diagnosis Date  . Coronary artery disease 5/09    Cath heavily calcified vessels; LAD 60,84m, 90 distal, LCX 60, 40 RCA 40s; normal EF  . PAD (peripheral artery disease)     Severe; s/p bilateral renal artery stents, moderate in-stent restenosis  . AAA (abdominal aortic aneurysm) 5/09    3.7x3.3 by u/s  . carotid stenosis 5/09    S/p L CEA   . Hypertension   . Hyperlipidemia   . COPD (chronic obstructive pulmonary disease)     Ongoing tobacco use  . GERD (gastroesophageal reflux disease)     With hiatal hernia  . Diabetes mellitus     Type 2  . Paroxysmal atrial fibrillation   . GIB (gastrointestinal bleeding) 9/11    S/p EGD with cautery at [facility]    Current Outpatient Prescriptions  Medication Sig Dispense Refill  . acetaminophen (PAIN RELIEF) 500 MG tablet Take 500 mg by mouth every 6 (six) hours as needed.        Marland Kitchen amLODipine (NORVASC) 10 MG tablet Take 10 mg by mouth daily.        Marland Kitchen CARTIA XT 120 MG 24 hr capsule TAKE 1 CAPSULE BY MOUTH DAILY  30 capsule  2  . carvedilol (COREG) 25 MG tablet Take 25 mg by mouth 2 (two) times daily.        Marland Kitchen  citalopram (CELEXA) 10 MG tablet Take 10 mg by mouth daily.        Marland Kitchen doxazosin (CARDURA) 1 MG tablet Take 1 mg by mouth daily.        . fenofibrate micronized (LOFIBRA) 134 MG capsule Take 134 mg by mouth at bedtime.        . hydrALAZINE (APRESOLINE) 100 MG tablet Take 100 mg by mouth 2 (two) times daily.        Marland Kitchen levofloxacin (LEVAQUIN) 500 MG tablet Take 500 mg by mouth daily.        Marland Kitchen lisinopril-hydrochlorothiazide (PRINZIDE,ZESTORETIC) 20-25 MG per tablet Take 1 tablet by mouth daily.        . NON FORMULARY Oxigen 2 liters, take at bedtime.       Marland Kitchen omeprazole (PRILOSEC) 20 MG capsule Take 20 mg by mouth 2 (two) times daily.        . pravastatin (PRAVACHOL) 80 MG tablet Take 80 mg by mouth at bedtime.        Marland Kitchen spironolactone (ALDACTONE) 25 MG tablet Take 25 mg by mouth daily.        . traZODone (DESYREL) 50  MG tablet Take 50 mg by mouth at bedtime as needed.        . warfarin (COUMADIN) 1 MG tablet Take as directed by Anticoagulation clinic   45 tablet  2  . DISCONTD: diltiazem (DILACOR XR) 120 MG 24 hr capsule Take 120 mg by mouth daily.          Allergies: Allergies  Allergen Reactions  . Clonidine Hydrochloride   . Codeine     REACTION: itching   Social Hx:  Still smoking, but quit after hospital stay.  ROS:  See HPI.  No melena or hematochezia.  All other systems reviewed and negative.  Vital Signs: BP 93/63  Pulse 84  Resp 14  Ht 5\' 5"  (1.651 m)  Wt 137 lb (62.143 kg)  BMI 22.80 kg/m2  PHYSICAL EXAM: Well nourished, well developed, in no acute distress HEENT: normal Neck: no JVD, left CEA scar Vascular: + bilateral carotid bruits Cardiac:  normal S1, S2; RRR; no murmur Lungs:  clear to auscultation bilaterally, no wheezing, rhonchi or rales Abd: soft, nontender, no hepatomegaly Ext: no edema Skin: warm and dry Neuro:  CNs 2-12 intact, no focal abnormalities noted  EKG:   NSR, HR 79, normal axis, TW inversions leads 1, aVL; no significant changes since prior tracings  ASSESSMENT AND PLAN:

## 2011-02-26 NOTE — Assessment & Plan Note (Addendum)
She bumped her troponins in the setting of diastolic CHF and acute COPD exacerbation.  She would be high risk for contrast nephropathy given her CKD.  She has not followed up with nephrology in a long time.  It looks like her creatinine was 1.4 during her hospitalization.  I spoke with Dr. Haroldine Laws over the phone.  She had a borderline LAD lesion at cath.  This was treated medically and she was cleared at that time for her CEA and did fine.  She is not having any angina and her EKG is unchanged.  We will arrange a myoview to assess for ischemia and have her follow up with me on a day Dr. Haroldine Laws is here in one week.  Of note, I spent over an hour with the patient today retrieving and reviewing records and discussing the case with her primary cardiologist.

## 2011-02-26 NOTE — Assessment & Plan Note (Signed)
INR supratherapeutic today.  Will hold off on ASA especially given h/o anemia and past h/o GI bleeding.

## 2011-02-26 NOTE — Assessment & Plan Note (Signed)
She is actually orthostatic today.  Hold off on restarting any BP meds.  She was on a lot of medicines before.  Suspect she is overdiuresed.  Get bmet today. Push oral fluids.  Follow up next week.  Will have her BP checked early next week in coumadin clinic.  If BP improved, restart coreg or possible just Toprol given recent MI.

## 2011-03-01 ENCOUNTER — Encounter: Payer: Self-pay | Admitting: *Deleted

## 2011-03-01 ENCOUNTER — Telehealth: Payer: Self-pay | Admitting: *Deleted

## 2011-03-01 ENCOUNTER — Other Ambulatory Visit: Payer: Self-pay | Admitting: Physician Assistant

## 2011-03-01 ENCOUNTER — Encounter: Payer: Medicare Other | Admitting: *Deleted

## 2011-03-01 ENCOUNTER — Encounter (INDEPENDENT_AMBULATORY_CARE_PROVIDER_SITE_OTHER): Payer: Medicare Other

## 2011-03-01 ENCOUNTER — Ambulatory Visit (INDEPENDENT_AMBULATORY_CARE_PROVIDER_SITE_OTHER): Payer: Medicare Other | Admitting: *Deleted

## 2011-03-01 ENCOUNTER — Other Ambulatory Visit: Payer: Medicare Other | Admitting: *Deleted

## 2011-03-01 DIAGNOSIS — I10 Essential (primary) hypertension: Secondary | ICD-10-CM

## 2011-03-01 DIAGNOSIS — R0989 Other specified symptoms and signs involving the circulatory and respiratory systems: Secondary | ICD-10-CM

## 2011-03-01 DIAGNOSIS — I4891 Unspecified atrial fibrillation: Secondary | ICD-10-CM

## 2011-03-01 DIAGNOSIS — R0609 Other forms of dyspnea: Secondary | ICD-10-CM

## 2011-03-01 MED ORDER — METOPROLOL SUCCINATE ER 25 MG PO TB24: ORAL_TABLET | ORAL | Status: DC

## 2011-03-01 NOTE — Telephone Encounter (Signed)
New rx sent in for toprol xl 12.5

## 2011-03-03 ENCOUNTER — Other Ambulatory Visit (INDEPENDENT_AMBULATORY_CARE_PROVIDER_SITE_OTHER): Payer: Medicare Other | Admitting: *Deleted

## 2011-03-03 ENCOUNTER — Inpatient Hospital Stay (HOSPITAL_COMMUNITY)
Admission: AD | Admit: 2011-03-03 | Discharge: 2011-03-15 | DRG: 216 | Disposition: A | Discharge status: Home Health | Payer: Medicare Other | Source: Ambulatory Visit | Attending: Thoracic Surgery (Cardiothoracic Vascular Surgery) | Admitting: Thoracic Surgery (Cardiothoracic Vascular Surgery)

## 2011-03-03 ENCOUNTER — Ambulatory Visit (INDEPENDENT_AMBULATORY_CARE_PROVIDER_SITE_OTHER): Payer: Medicare Other | Admitting: Physician Assistant

## 2011-03-03 ENCOUNTER — Ambulatory Visit (HOSPITAL_BASED_OUTPATIENT_CLINIC_OR_DEPARTMENT_OTHER): Payer: Medicare Other | Admitting: Radiology

## 2011-03-03 ENCOUNTER — Encounter: Payer: Self-pay | Admitting: Physician Assistant

## 2011-03-03 ENCOUNTER — Inpatient Hospital Stay (HOSPITAL_COMMUNITY): Payer: Medicare Other

## 2011-03-03 DIAGNOSIS — I714 Abdominal aortic aneurysm, without rupture, unspecified: Secondary | ICD-10-CM | POA: Diagnosis present

## 2011-03-03 DIAGNOSIS — I214 Non-ST elevation (NSTEMI) myocardial infarction: Secondary | ICD-10-CM | POA: Insufficient documentation

## 2011-03-03 DIAGNOSIS — I059 Rheumatic mitral valve disease, unspecified: Secondary | ICD-10-CM | POA: Diagnosis present

## 2011-03-03 DIAGNOSIS — I4891 Unspecified atrial fibrillation: Secondary | ICD-10-CM

## 2011-03-03 DIAGNOSIS — I509 Heart failure, unspecified: Secondary | ICD-10-CM | POA: Diagnosis present

## 2011-03-03 DIAGNOSIS — R079 Chest pain, unspecified: Secondary | ICD-10-CM

## 2011-03-03 DIAGNOSIS — N189 Chronic kidney disease, unspecified: Secondary | ICD-10-CM | POA: Insufficient documentation

## 2011-03-03 DIAGNOSIS — K029 Dental caries, unspecified: Secondary | ICD-10-CM | POA: Diagnosis present

## 2011-03-03 DIAGNOSIS — R0989 Other specified symptoms and signs involving the circulatory and respiratory systems: Secondary | ICD-10-CM

## 2011-03-03 DIAGNOSIS — I2510 Atherosclerotic heart disease of native coronary artery without angina pectoris: Secondary | ICD-10-CM | POA: Insufficient documentation

## 2011-03-03 DIAGNOSIS — K0530 Chronic periodontitis, unspecified: Secondary | ICD-10-CM | POA: Diagnosis present

## 2011-03-03 DIAGNOSIS — E119 Type 2 diabetes mellitus without complications: Secondary | ICD-10-CM | POA: Diagnosis present

## 2011-03-03 DIAGNOSIS — J449 Chronic obstructive pulmonary disease, unspecified: Secondary | ICD-10-CM

## 2011-03-03 DIAGNOSIS — I5031 Acute diastolic (congestive) heart failure: Secondary | ICD-10-CM | POA: Diagnosis present

## 2011-03-03 DIAGNOSIS — Z8249 Family history of ischemic heart disease and other diseases of the circulatory system: Secondary | ICD-10-CM

## 2011-03-03 DIAGNOSIS — J4489 Other specified chronic obstructive pulmonary disease: Secondary | ICD-10-CM | POA: Diagnosis present

## 2011-03-03 DIAGNOSIS — J9620 Acute and chronic respiratory failure, unspecified whether with hypoxia or hypercapnia: Secondary | ICD-10-CM | POA: Diagnosis not present

## 2011-03-03 DIAGNOSIS — E876 Hypokalemia: Secondary | ICD-10-CM | POA: Diagnosis not present

## 2011-03-03 DIAGNOSIS — J810 Acute pulmonary edema: Secondary | ICD-10-CM | POA: Diagnosis not present

## 2011-03-03 DIAGNOSIS — D62 Acute posthemorrhagic anemia: Secondary | ICD-10-CM | POA: Diagnosis not present

## 2011-03-03 DIAGNOSIS — N179 Acute kidney failure, unspecified: Secondary | ICD-10-CM | POA: Diagnosis not present

## 2011-03-03 DIAGNOSIS — I498 Other specified cardiac arrhythmias: Secondary | ICD-10-CM | POA: Diagnosis not present

## 2011-03-03 DIAGNOSIS — N184 Chronic kidney disease, stage 4 (severe): Secondary | ICD-10-CM | POA: Diagnosis present

## 2011-03-03 DIAGNOSIS — I10 Essential (primary) hypertension: Secondary | ICD-10-CM

## 2011-03-03 DIAGNOSIS — R0609 Other forms of dyspnea: Secondary | ICD-10-CM

## 2011-03-03 DIAGNOSIS — R791 Abnormal coagulation profile: Secondary | ICD-10-CM | POA: Diagnosis not present

## 2011-03-03 DIAGNOSIS — I739 Peripheral vascular disease, unspecified: Secondary | ICD-10-CM | POA: Diagnosis present

## 2011-03-03 DIAGNOSIS — I129 Hypertensive chronic kidney disease with stage 1 through stage 4 chronic kidney disease, or unspecified chronic kidney disease: Secondary | ICD-10-CM | POA: Diagnosis present

## 2011-03-03 DIAGNOSIS — I2582 Chronic total occlusion of coronary artery: Secondary | ICD-10-CM | POA: Diagnosis present

## 2011-03-03 DIAGNOSIS — F17200 Nicotine dependence, unspecified, uncomplicated: Secondary | ICD-10-CM | POA: Diagnosis present

## 2011-03-03 DIAGNOSIS — R0789 Other chest pain: Secondary | ICD-10-CM

## 2011-03-03 DIAGNOSIS — Y849 Medical procedure, unspecified as the cause of abnormal reaction of the patient, or of later complication, without mention of misadventure at the time of the procedure: Secondary | ICD-10-CM | POA: Diagnosis present

## 2011-03-03 DIAGNOSIS — I6529 Occlusion and stenosis of unspecified carotid artery: Secondary | ICD-10-CM | POA: Diagnosis present

## 2011-03-03 DIAGNOSIS — I5032 Chronic diastolic (congestive) heart failure: Secondary | ICD-10-CM

## 2011-03-03 DIAGNOSIS — R9431 Abnormal electrocardiogram [ECG] [EKG]: Secondary | ICD-10-CM

## 2011-03-03 DIAGNOSIS — I658 Occlusion and stenosis of other precerebral arteries: Secondary | ICD-10-CM | POA: Diagnosis present

## 2011-03-03 LAB — CBC
Platelets: 310 10*3/uL (ref 150–400)
RBC: 3.32 MIL/uL — ABNORMAL LOW (ref 3.87–5.11)
WBC: 14.9 10*3/uL — ABNORMAL HIGH (ref 4.0–10.5)

## 2011-03-03 LAB — BASIC METABOLIC PANEL
Calcium: 9.4 mg/dL (ref 8.4–10.5)
Chloride: 106 mEq/L (ref 96–112)
Creatinine, Ser: 2.1 mg/dL — ABNORMAL HIGH (ref 0.4–1.2)
GFR: 25.86 mL/min — ABNORMAL LOW (ref >60.00)

## 2011-03-03 LAB — COMPREHENSIVE METABOLIC PANEL
AST: 15 U/L (ref 0–37)
Albumin: 3.4 g/dL — ABNORMAL LOW (ref 3.5–5.2)
Chloride: 99 mEq/L (ref 96–112)
Creatinine, Ser: 2.18 mg/dL — ABNORMAL HIGH (ref 0.50–1.10)
Potassium: 5.1 mEq/L (ref 3.5–5.1)
Total Bilirubin: 0.4 mg/dL (ref 0.3–1.2)
Total Protein: 7 g/dL (ref 6.0–8.3)

## 2011-03-03 LAB — PRO B NATRIURETIC PEPTIDE: Pro B Natriuretic peptide (BNP): 10242 pg/mL — ABNORMAL HIGH (ref 0–125)

## 2011-03-03 LAB — GLUCOSE, CAPILLARY

## 2011-03-03 LAB — CARDIAC PANEL(CRET KIN+CKTOT+MB+TROPI)
Relative Index: INVALID (ref 0.0–2.5)
Total CK: 35 U/L (ref 7–177)

## 2011-03-03 MED ORDER — TECHNETIUM TC 99M TETROFOSMIN IV KIT
33.0000 | PACK | Freq: Once | INTRAVENOUS | Status: AC | PRN
  Administered 2011-03-03: 33 via INTRAVENOUS

## 2011-03-03 MED ORDER — TECHNETIUM TC 99M TETROFOSMIN IV KIT
11.0000 | PACK | Freq: Once | INTRAVENOUS | Status: AC | PRN
  Administered 2011-03-03: 11 via INTRAVENOUS

## 2011-03-03 MED ORDER — REGADENOSON 0.4 MG/5ML IV SOLN
0.4000 mg | Freq: Once | INTRAVENOUS | Status: AC
  Administered 2011-03-03: 0.4 mg via INTRAVENOUS

## 2011-03-03 MED ORDER — NITROGLYCERIN 0.4 MG SL SUBL
0.4000 mg | SUBLINGUAL_TABLET | SUBLINGUAL | Status: AC | PRN
  Administered 2011-03-03 (×3): 0.4 mg via SUBLINGUAL

## 2011-03-03 NOTE — Assessment & Plan Note (Signed)
Hold Coumadin.  Currently maintaining sinus rhythm.  Start heparin once INR less than 2.

## 2011-03-03 NOTE — Assessment & Plan Note (Signed)
Volume appears stable.  Gentle hydration for CKD as noted.

## 2011-03-03 NOTE — Progress Notes (Addendum)
History of Present Illness: Primary Cardiologist:  Dr. Glori Bickers  [name] is a 60 y.o. female with severe vascular disease followed by Dr. [name], COPD, HTN, CAD with borderline LAD lesion (manged medically), PAF and markedly abnormal ECG with persistent severe T wave inversions.   Has a h/o severe GIB in 10/11.  In early December, readmitted to [place] for 3rd episode of AFib. Rate was controlled on admit.  Converted spontaneously after 30 hours. When in AF feels SOB even if slow.  On coumadin.   Had sleep study with AHI 7 and O2 desats. Unable to tolerate CPAP but wearing O2 at night.  Creatinine in 2.0 range in past and has been referred to nephrology.   I saw her for follow up after being discharged from [facility] last week. She came in with acute respiratory failure.  She was diagnosed with acute bronchitis and was placed on antibiotics.  I suspect she had a COPD exacerbation.  She had congestive heart failure in the setting of her bronchitis and was diuresed.  Follow up CXR was negative for edema..  An echocardiogram demonstrated normal LV function with concentric LVH.  She also ruled in for NSTEMI with peak troponin of 1.24.  She developed hypotension and was told to hold all of her medications until follow up.  INR was also supratherapeutic and was held.  INR when I saw her was 3.4.  She had moved recently and felt poorly since.  Developed chest pain week before admission.  Felt tight around her chest and lasted 30 minutes.  Has a h/o anemia and Hgb was 9.2 at [facility].  I spoke to Dr. Haroldine Laws last week.  She has CKD.  We decided to do a myoview.  Creatinine was 2.7.  We asked her to push her oral fluids.  I also gave her Lasix to take if she had significant weight gain or increased shortness of breath.  She came in today for her Myoview.  She has chronic diffuse T-wave inversions on her ECG.  She developed significant ST depression with infusion of Lexiscan and  significant chest discomfort.  She received nitroglycerin x3.  She was nauseated and had pain in her back as well as shortness of breath.  She is now under the camera to get her stress images.  I reviewed her ECGs with Dr. Rayann Heman, the doctor of the day.  We plan to admit her for further evaluation.  We will place her on gentle hydration and follow her creatinine and hopefully set up for cardiac catheterization in the next couple of days.  Past Medical History  Diagnosis Date  . Coronary artery disease 5/09    medical therapy:  5/09: Cath heavily calcified vessels;  pLAD 40%,  mLAD 70,75%, dLAD 90%, LCX mid 60%, pRCA 40%; normal EF  . PAD (peripheral artery disease)     Severe; s/p bilateral renal artery stents, moderate in-stent restenosis  . AAA (abdominal aortic aneurysm) 5/09    3.7x3.3 by u/s 2009  . carotid stenosis 5/09    S/p L CEA   . Hypertension   . Hyperlipidemia   . COPD (chronic obstructive pulmonary disease)     Ongoing tobacco use  . GERD (gastroesophageal reflux disease)     With hiatal hernia  . Diabetes mellitus     Type 2  . Paroxysmal atrial fibrillation     coumadin;  echo 9/07: EF 60%, mild LVH  . GIB (gastrointestinal bleeding) 9/11  S/p EGD with cautery at [facility]  . Sleep apnea   . Abnormal EKG     deep TW inversions chronic    Current Outpatient Prescriptions  Medication Sig Dispense Refill  . citalopram (CELEXA) 10 MG tablet Take 10 mg by mouth daily.        . fenofibrate micronized (LOFIBRA) 134 MG capsule Take 134 mg by mouth at bedtime.        . furosemide (LASIX) 40 MG tablet Take 1 tablet (40 mg total) by mouth daily as needed.  30 tablet  11  . levofloxacin (LEVAQUIN) 500 MG tablet Take 500 mg by mouth daily.        . metoprolol succinate (TOPROL XL) 25 MG 24 hr tablet TAKE 1/2 (HALF) TABLET DAILY  30 tablet  11  . NON FORMULARY Oxigen 2 liters, take at bedtime.       Marland Kitchen omeprazole (PRILOSEC) 20 MG capsule Take 20 mg by mouth 2 (two) times daily.         . pravastatin (PRAVACHOL) 80 MG tablet Take 80 mg by mouth at bedtime.        . traZODone (DESYREL) 50 MG tablet Take 50 mg by mouth at bedtime as needed.        . warfarin (COUMADIN) 1 MG tablet 0.5 mg. Take as directed by Anticoagulation clinic        . DISCONTD: warfarin (COUMADIN) 1 MG tablet Take as directed by Anticoagulation clinic   45 tablet  2  . DISCONTD: acetaminophen (PAIN RELIEF) 500 MG tablet Take 500 mg by mouth every 6 (six) hours as needed.        Marland Kitchen DISCONTD: amLODipine (NORVASC) 10 MG tablet Take 10 mg by mouth daily.        Marland Kitchen DISCONTD: CARTIA XT 120 MG 24 hr capsule TAKE 1 CAPSULE BY MOUTH DAILY  30 capsule  2  . DISCONTD: carvedilol (COREG) 25 MG tablet Take 25 mg by mouth 2 (two) times daily.        Marland Kitchen DISCONTD: doxazosin (CARDURA) 1 MG tablet Take 1 mg by mouth daily.        Marland Kitchen DISCONTD: hydrALAZINE (APRESOLINE) 100 MG tablet Take 100 mg by mouth 2 (two) times daily.        Marland Kitchen DISCONTD: lisinopril-hydrochlorothiazide (PRINZIDE,ZESTORETIC) 20-25 MG per tablet Take 1 tablet by mouth daily.        Marland Kitchen DISCONTD: spironolactone (ALDACTONE) 25 MG tablet Take 25 mg by mouth daily.         No current facility-administered medications for this visit.   Facility-Administered Medications Ordered in Other Visits  Medication Dose Route Frequency Provider Last Rate Last Dose  . regadenoson (LEXISCAN) injection SOLN 0.4 mg  0.4 mg Intravenous Once Jolaine Artist, MD   0.4 mg at 03/03/11 1320  . technetium tetrofosmin (TC-MYOVIEW) injection 11 milli Curie  11 milli Curie Intravenous Once PRN Jolaine Artist, MD   [place] at 03/03/11 1145  . technetium tetrofosmin (TC-MYOVIEW) injection 33 milli Curie  33 milli Curie Intravenous Once PRN Jolaine Artist, MD   [place] at 03/03/11 1320    Allergies: Allergies  Allergen Reactions  . Clonidine Hydrochloride   . Codeine     REACTION: itching    Social Hx:  Still smoking, but quit after recent hospital  stay.  ROS:  See HPI.  She had some chest pain yesterday.  No melena or hematochezia.  All other systems reviewed and negative.  Vital Signs: BP 102/78  Pulse 102  PHYSICAL EXAM: Well nourished, well developed, in no acute distress HEENT: normal Neck: no JVD At 45, left CEA scar Vascular: + bilateral carotid bruits Cardiac:  normal S1, S2; RRR; no murmur Lungs:  clear to auscultation bilaterally, no wheezing, rhonchi or rales Abd: soft, nontender, no hepatomegaly Ext: no edema Skin: warm and dry Neuro:  CNs 2-12 intact, no focal abnormalities noted  EKG:   As noted above, she is in normal sinus rhythm  ASSESSMENT AND PLAN:

## 2011-03-03 NOTE — Assessment & Plan Note (Addendum)
She had a recent NSTEMI in the setting of congestive heart failure and COPD exacerbation.  She had significant chest pain the office today during her Myoview scan with significant ST depression.  I discussed her case with Dr. Rayann Heman.  I will also discuss her case with Dr. Haroldine Laws.  We plan to admit her to the hospital.  If her INR is less than 2, she will be started on IV heparin.  We'll also place her on IV nitroglycerin and beta blocker.  Most of her medications have been stopped due to hypotension after her recent hospital stay.  We will place her on gentle hydration and follow her creatinine.  She will tentatively be set up for cardiac catheterization tomorrow.  This may need to be rescheduled depending upon her INR and creatinine.  She may need to proceed to cath earlier if symptoms are unstable.    I have seen the patient, examined her, and discussed the above H&P with Richardson Dopp.  The patient has ongoing chest pain and profound ST depression which persists s/p stress testing.  I think that she warrants admission to [facility] for stabilization.  Hopefully, with gentle hydration, she will be a candidate for catheterization.  We will admit to cardiology service at [facility].  Thompson Grayer, MD

## 2011-03-03 NOTE — Assessment & Plan Note (Addendum)
Follow creatinine closely.  Place on gentle hydration to improve renal function.  Use minimal dye and hold off on left ventriculogram.

## 2011-03-03 NOTE — Assessment & Plan Note (Signed)
Recent exacerbation.  She is still taking antibiotics.  She has a week's worth of Levaquin left.  Continued this during hospitalization.

## 2011-03-03 NOTE — Progress Notes (Signed)
[place] [address] [phone]  Cardiology Nuclear Med Study  TANEAL TERRIO is a 60 y.o. female [id number] Dec 30, 1950   Nuclear Med Background Indication for Stress Test:  Evaluation for Ischemia and 02/25/11 Post Hospital: [facility] Reg.; Acute Resp. Fail./CHF>NSTEMI d/t CHF Marlena Clipper) History: 6/12 Echo- Nml, 2009 Heart Catheterization, 6/12 Myocardial Infarction and1997 Myocardial Perfusion Study, AAA(3.5 cm) Cardiac Risk Factors: Carotid Disease, Family History - CAD, Hypertension, Lipids, NIDDM, PVD and Smoker  Symptoms:  Chest Pain, Chest Pressure.  (last date of chest discomfort- yesterday), Dizziness, DOE, Fatigue, Light-Headedness and Palpitations   Nuclear Pre-Procedure Caffeine/Decaff Intake:  None NPO After: 7:00pm   Lungs:  Clear IV 0.9% NS with Angio Cath:  18g  IV Site: R Antecubital  IV Started by:  Eliezer Lofts, EMT-P  Chest Size (in):  40 Cup Size: C  Height: 5\' 5"  (1.651 m)  Weight:  138 lb (62.596 kg)  BMI:  Body mass index is 22.96 kg/(m^2). Tech Comments:  Not taking any BP meds currently    Nuclear Med Study 1 or 2 day study: 1 day  Stress Test Type:  Carlton Adam  Reading MD: Glori Bickers, MD  Order Authorizing Provider:  Dr. Glori Bickers  Resting Radionuclide: Technetium 53m Tetrofosmin  Resting Radionuclide Dose: 11.0 mCi   Stress Radionuclide:  Technetium 23m Tetrofosmin  Stress Radionuclide Dose: 33.0 mCi           Stress Protocol Rest HR: 73 Stress HR: 102  Rest BP: 151/76 Stress BP: 167/80  Exercise Time (min): n/a METS: n/a   Predicted Max HR: 163 bpm % Max HR: 61.35 bpm Rate Pressure Product: 16700   Dose of Adenosine (mg):  n/a Dose of Lexiscan: 0.4 mg  Dose of Atropine (mg): n/a Dose of Dobutamine: n/a mcg/kg/min (at max HR)  Stress Test Technologist: Crissie Figures, RN  Nuclear Technologist:  Vedia Pereyra, CNMT     Rest Procedure:  Myocardial perfusion imaging  was performed at rest 45 minutes following the intravenous administration of Technetium 51m Tetrofosmin. Rest ECG: NSR with diffuse T wave inversion  Stress Procedure:  The patient received IV Lexiscan 0.4 mg over 15-seconds.  Technetium 12m Tetrofosmin injected at 30-seconds. She had CP 7/10, nausea and dizziness with injection, which improved early in recovery. The symptoms returned along with n/v and diaphoresis, late in recovery, with significant ST-T changes. Richardson Dopp PA notified. Patient given NTG SL x 3 with relief of symptoms and stress scans performed. Patient was taken via wheelchair to see Richardson Dopp and was symptom free.  Quantitative spect images were obtained after a 45 minute delay. Stress ECG: Baseline ECG with 87mm ST depression anteriorly with deep TWI. At peak there is 3 mm ST depression with worsening TWI.  QPS Raw Data Images:  Normal; no motion artifact; normal heart/lung ratio. Stress Images:  Severely decreased uptake in the lateral and anterolateral walls from the base to distal ventricle.  Rest Images:  Mildly decreased uptake in the lateral and anterolateral walls. 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 Transient Ischemic Dilatation (Normal <1.22):  0.88 Lung/Heart Ratio (Normal <0.45):  0.29  Quantitative Gated Spect Images QGS EDV:  108 ml QGS ESV:  72 ml QGS cine images:  Global HK with severe lateral HK. QGS EF: 34%  Impression Exercise Capacity:  Lexiscan with no exercise. BP Response:  n/a Clinical Symptoms:  Significant chest pain. ECG  Impression:  Significant ST abnormalities consistent with ischemia. Comparison with Prior Nuclear Study: No images to compare  Overall Impression:  High risk stress nuclear study. 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 Patient admitted for cath.     Daniel Bensimhon

## 2011-03-04 ENCOUNTER — Inpatient Hospital Stay (HOSPITAL_COMMUNITY): Payer: Medicare Other

## 2011-03-04 DIAGNOSIS — I059 Rheumatic mitral valve disease, unspecified: Secondary | ICD-10-CM

## 2011-03-04 DIAGNOSIS — I2510 Atherosclerotic heart disease of native coronary artery without angina pectoris: Secondary | ICD-10-CM

## 2011-03-04 DIAGNOSIS — Z01810 Encounter for preprocedural cardiovascular examination: Secondary | ICD-10-CM

## 2011-03-04 LAB — BASIC METABOLIC PANEL
CO2: 24 mEq/L (ref 19–32)
Chloride: 105 mEq/L (ref 96–112)
GFR calc Af Amer: 32 mL/min — ABNORMAL LOW (ref >60)
Potassium: 4.9 mEq/L (ref 3.5–5.1)
Sodium: 138 mEq/L (ref 135–145)

## 2011-03-04 LAB — CBC
HCT: 28.7 % — ABNORMAL LOW (ref 36.0–46.0)
Hemoglobin: 9.6 g/dL — ABNORMAL LOW (ref 12.0–15.0)
MCV: 89.7 fL (ref 78.0–100.0)
RBC: 3.2 MIL/uL — ABNORMAL LOW (ref 3.87–5.11)
RDW: 13.9 % (ref 11.5–15.5)
WBC: 7.3 10*3/uL (ref 4.0–10.5)

## 2011-03-04 LAB — CARDIAC PANEL(CRET KIN+CKTOT+MB+TROPI)
CK, MB: 4.8 ng/mL — ABNORMAL HIGH (ref 0.3–4.0)
CK, MB: 4.5 ng/mL — ABNORMAL HIGH (ref 0.3–4.0)
Relative Index: INVALID (ref 0.0–2.5)
Total CK: 36 U/L (ref 7–177)
Total CK: 37 U/L (ref 7–177)
Troponin I: <0.3 ng/mL (ref <0.30)

## 2011-03-04 LAB — BLOOD GAS, ARTERIAL
Acid-base deficit: 2.1 mmol/L — ABNORMAL HIGH (ref 0.0–2.0)
Bicarbonate: 22.2 mEq/L (ref 20.0–24.0)
TCO2: 23.4 mmol/L (ref 0–100)
pCO2 arterial: 37.9 mmHg (ref 35.0–45.0)
pO2, Arterial: 76.8 mmHg — ABNORMAL LOW (ref 80.0–100.0)

## 2011-03-04 LAB — PROTIME-INR
INR: 1.3 (ref 0.00–1.49)
Prothrombin Time: 16.4 seconds — ABNORMAL HIGH (ref 11.6–15.2)

## 2011-03-04 LAB — GLUCOSE, CAPILLARY
Glucose-Capillary: 109 mg/dL — ABNORMAL HIGH (ref 70–99)
Glucose-Capillary: 155 mg/dL — ABNORMAL HIGH (ref 70–99)

## 2011-03-04 LAB — SURGICAL PCR SCREEN: Staphylococcus aureus: NEGATIVE

## 2011-03-04 NOTE — Progress Notes (Addendum)
Nuc med study routed to Dr. Haroldine Laws 03/04/11 Charlton Amor  Pt admitted for cath.   Daniel Bensimhon

## 2011-03-05 ENCOUNTER — Inpatient Hospital Stay (HOSPITAL_COMMUNITY): Payer: Medicare Other

## 2011-03-05 DIAGNOSIS — I4891 Unspecified atrial fibrillation: Secondary | ICD-10-CM

## 2011-03-05 DIAGNOSIS — IMO0001 Reserved for inherently not codable concepts without codable children: Secondary | ICD-10-CM

## 2011-03-05 DIAGNOSIS — I059 Rheumatic mitral valve disease, unspecified: Secondary | ICD-10-CM

## 2011-03-05 DIAGNOSIS — I2510 Atherosclerotic heart disease of native coronary artery without angina pectoris: Secondary | ICD-10-CM

## 2011-03-05 DIAGNOSIS — E1165 Type 2 diabetes mellitus with hyperglycemia: Secondary | ICD-10-CM

## 2011-03-05 HISTORY — PX: MAZE: SHX5063

## 2011-03-05 HISTORY — PX: CORONARY ARTERY BYPASS GRAFT: SHX141

## 2011-03-05 HISTORY — PX: MITRAL VALVE REPAIR: SHX2039

## 2011-03-05 LAB — POCT I-STAT, CHEM 8
Calcium, Ion: 1.05 mmol/L — ABNORMAL LOW (ref 1.12–1.32)
Chloride: 105 mEq/L (ref 96–112)
Creatinine, Ser: 1.5 mg/dL — ABNORMAL HIGH (ref 0.50–1.10)
Glucose, Bld: 118 mg/dL — ABNORMAL HIGH (ref 70–99)
Potassium: 4.4 mEq/L (ref 3.5–5.1)

## 2011-03-05 LAB — PROTIME-INR
INR: 1.27 (ref 0.00–1.49)
INR: 1.41 (ref 0.00–1.49)
Prothrombin Time: 16.2 seconds — ABNORMAL HIGH (ref 11.6–15.2)
Prothrombin Time: 17.5 seconds — ABNORMAL HIGH (ref 11.6–15.2)

## 2011-03-05 LAB — POCT I-STAT 4, (NA,K, GLUC, HGB,HCT)
Glucose, Bld: 127 mg/dL — ABNORMAL HIGH (ref 70–99)
Glucose, Bld: 133 mg/dL — ABNORMAL HIGH (ref 70–99)
Glucose, Bld: 124 mg/dL — ABNORMAL HIGH (ref 70–99)
Glucose, Bld: 136 mg/dL — ABNORMAL HIGH (ref 70–99)
Glucose, Bld: 117 mg/dL — ABNORMAL HIGH (ref 70–99)
HCT: 24 % — ABNORMAL LOW (ref 36.0–46.0)
HCT: 21 % — ABNORMAL LOW (ref 36.0–46.0)
HCT: 23 % — ABNORMAL LOW (ref 36.0–46.0)
HCT: 21 % — ABNORMAL LOW (ref 36.0–46.0)
HCT: 25 % — ABNORMAL LOW (ref 36.0–46.0)
HCT: 26 % — ABNORMAL LOW (ref 36.0–46.0)
Hemoglobin: 8.2 g/dL — ABNORMAL LOW (ref 12.0–15.0)
Hemoglobin: 7.8 g/dL — ABNORMAL LOW (ref 12.0–15.0)
Hemoglobin: 7.8 g/dL — ABNORMAL LOW (ref 12.0–15.0)
Hemoglobin: 8.5 g/dL — ABNORMAL LOW (ref 12.0–15.0)
Potassium: 4.3 mEq/L (ref 3.5–5.1)
Potassium: 5 mEq/L (ref 3.5–5.1)
Potassium: 5.4 mEq/L — ABNORMAL HIGH (ref 3.5–5.1)
Potassium: 4.8 mEq/L (ref 3.5–5.1)
Sodium: 139 mEq/L (ref 135–145)
Sodium: 140 mEq/L (ref 135–145)
Sodium: 136 mEq/L (ref 135–145)

## 2011-03-05 LAB — CBC
HCT: 23 % — ABNORMAL LOW (ref 36.0–46.0)
HCT: 25.3 % — ABNORMAL LOW (ref 36.0–46.0)
Hemoglobin: 8.1 g/dL — ABNORMAL LOW (ref 12.0–15.0)
Hemoglobin: 9 g/dL — ABNORMAL LOW (ref 12.0–15.0)
MCH: 30.8 pg (ref 26.0–34.0)
MCH: 29.3 pg (ref 26.0–34.0)
MCHC: 34.4 g/dL (ref 30.0–36.0)
MCHC: 35.6 g/dL (ref 30.0–36.0)
MCV: 82.4 fL (ref 78.0–100.0)
Platelets: 245 10*3/uL (ref 150–400)
RBC: 2.79 MIL/uL — ABNORMAL LOW (ref 3.87–5.11)
RBC: 2.71 MIL/uL — ABNORMAL LOW (ref 3.87–5.11)
RDW: 15.6 % — ABNORMAL HIGH (ref 11.5–15.5)
RDW: 16.3 % — ABNORMAL HIGH (ref 11.5–15.5)
WBC: 13.4 10*3/uL — ABNORMAL HIGH (ref 4.0–10.5)

## 2011-03-05 LAB — APTT: aPTT: 35 seconds (ref 24–37)

## 2011-03-05 LAB — GLUCOSE, CAPILLARY
Glucose-Capillary: 132 mg/dL — ABNORMAL HIGH (ref 70–99)
Glucose-Capillary: 103 mg/dL — ABNORMAL HIGH (ref 70–99)
Glucose-Capillary: 99 mg/dL (ref 70–99)
Glucose-Capillary: 97 mg/dL (ref 70–99)
Glucose-Capillary: 115 mg/dL — ABNORMAL HIGH (ref 70–99)
Glucose-Capillary: 129 mg/dL — ABNORMAL HIGH (ref 70–99)

## 2011-03-05 LAB — POCT I-STAT 3, ART BLOOD GAS (G3+)
Acid-base deficit: 3 mmol/L — ABNORMAL HIGH (ref 0.0–2.0)
Acid-base deficit: 3 mmol/L — ABNORMAL HIGH (ref 0.0–2.0)
Bicarbonate: 24.4 mEq/L — ABNORMAL HIGH (ref 20.0–24.0)
O2 Saturation: 93 %
O2 Saturation: 95 %
O2 Saturation: 96 %
TCO2: 23 mmol/L (ref 0–100)
TCO2: 22 mmol/L (ref 0–100)
TCO2: 25 mmol/L (ref 0–100)
TCO2: 24 mmol/L (ref 0–100)
TCO2: 25 mmol/L (ref 0–100)
pCO2 arterial: 37.5 mmHg (ref 35.0–45.0)
pCO2 arterial: 38.7 mmHg (ref 35.0–45.0)
pCO2 arterial: 39.7 mmHg (ref 35.0–45.0)
pCO2 arterial: 50.1 mmHg — ABNORMAL HIGH (ref 35.0–45.0)
pH, Arterial: 7.379 (ref 7.350–7.400)
pH, Arterial: 7.285 — ABNORMAL LOW (ref 7.350–7.400)
pH, Arterial: 7.392 (ref 7.350–7.400)
pO2, Arterial: 64 mmHg — ABNORMAL LOW (ref 80.0–100.0)

## 2011-03-05 LAB — URINALYSIS, ROUTINE W REFLEX MICROSCOPIC
Ketones, ur: NEGATIVE mg/dL
Leukocytes, UA: NEGATIVE
Nitrite: NEGATIVE
Specific Gravity, Urine: 1.012 (ref 1.005–1.030)
pH: 7.5 (ref 5.0–8.0)

## 2011-03-05 LAB — BASIC METABOLIC PANEL
BUN: 24 mg/dL — ABNORMAL HIGH (ref 6–23)
Creatinine, Ser: 2.1 mg/dL — ABNORMAL HIGH (ref 0.50–1.10)
GFR calc Af Amer: 29 mL/min — ABNORMAL LOW (ref >60)
GFR calc non Af Amer: 24 mL/min — ABNORMAL LOW (ref >60)
Potassium: 4.6 mEq/L (ref 3.5–5.1)

## 2011-03-05 LAB — HEMOGLOBIN AND HEMATOCRIT, BLOOD: HCT: 21.7 % — ABNORMAL LOW (ref 36.0–46.0)

## 2011-03-05 LAB — PLATELET COUNT: Platelets: 168 10*3/uL (ref 150–400)

## 2011-03-05 NOTE — Consult Note (Signed)
NAMELAVERGNE, CABLER NO.:  [id number]  MEDICAL RECORD NO.:  [id number]  LOCATION:  2909                         FACILITY:  [facility]  PHYSICIAN:  Valentina Gu. Roxy Manns, M.D. DATE OF BIRTH:  [birth year]  DATE OF CONSULTATION:  03/04/2011 DATE OF DISCHARGE:                                CONSULTATION   REQUESTING PHYSICIAN:  Loretha Brasil. Lia Foyer, MD, [facility]  PRIMARY CARDIOLOGIST:  Shaune Pascal. Bensimhon, MD  PRIMARY CARE PHYSICIAN:  Dr. Charolotte Capuchin, at [facility].  REASON FOR CONSULTATION:  Critical left main coronary artery disease.  HISTORY OF PRESENT ILLNESS:  Ms. Holderbaum is a 60 year old widowed white female who currently lives in [place], [place] with known history of coronary artery disease, hypertension, hyperlipidemia, longstanding tobacco abuse, chronic obstructive pulmonary disease, peripheral arterial disease, recurrent paroxysmal atrial fibrillation, chronic renal insufficiency, and abdominal aortic aneurysm.  The patient's cardiac history dates back to May 17, 2000, when she underwent her first cardiac catheterization.  She was noted to have single-vessel coronary artery disease and was treated medically.  In 2007, she had followup cardiac catheterization demonstrating severe single-vessel disease involving the left anterior descending coronary artery.  Again, medical therapy was recommended.  In March 2011, the patient developed recurrent paroxysmal atrial fibrillation.  Since then, she has been treated by Dr. Haroldine Laws, and she remains on long-term anticoagulation with Coumadin.  Echocardiograms have demonstrated normal left ventricular systolic function with mild left ventricular hypertrophy. She did have an episode of GI bleeding in September 2011, treated with EGD and cauterization.  She has gone back on Coumadin since that time and has been otherwise stable until recently.  She states that several weeks ago she was first began to  develop exertional fatigue.  She then had an episode of substernal chest pain 1 day approximately 2 weeks ago that lasted for 30-45 minutes.  The pain resolved on its own.  Over the next week, the patient developed worsening symptoms of shortness of breath until she developed resting shortness of breath and orthopnea. She was hospitalized at [facility] with congestive heart failure.  She was treated with diuretic therapy and improved.  She apparently had positive cardiac enzymes during that admission consistent with acute non-ST-segment elevation myocardial infarction.  She was discharged from the hospital there with instructions to follow up promptly with Dr. Haroldine Laws.  She was seen in the office 2 days ago and underwent a stress Myoview exam.  She developed chest pain with significant ST-segment depression during the test, and was subsequently admitted to the hospital for further management.  She was hydrated because of chronic renal insufficiency with baseline creatinine 2.7 at the time of admission.  An echocardiogram was performed demonstrating normal left ventricular systolic function.  She underwent cardiac catheterization earlier today by Dr. Lia Foyer.  She was found to have critical left main disease and three-vessel coronary artery disease.  Cardiothoracic surgical consultation has been requested.  REVIEW OF SYSTEMS:  GENERAL:  The patient reports normal appetite.  She has not been gaining nor losing weight recently.  CARDIAC:  Notable for several-week history of worsening symptoms of exertional shortness of breath and  fatigue with a total of three episodes of substernal chest pain including one episode yesterday during the stress test and one the day before that lasted 30 minutes or so.  She states that prior to 3 weeks ago, she had no trouble with shortness of breath, either with activity or at rest.  Prior to that, she also has not had any  previous history of orthopnea, PND, syncope.  She has had a history of recurrent paroxysmal atrial fibrillation off and on since March 2011.  She remains in sinus rhythm, for the most part she states that her atrial fibrillation always terminates spontaneously within less than 24-48 hours, usually much less than 24.  She has never had any syncope.  She has had some off and on mild lower extremity edema.  She denies any shortness of breath at present.  She has not had any chest pain since hospital admission yesterday.  RESPIRATORY:  Notable for the evidence of any ongoing productive cough, hemoptysis, wheezing.  The patient reports that she had quit smoking completely in March 2011, but unfortunately she went back to smoking again last winter and has even been smoking a little bit since she was discharged from [facility] last week. GASTROINTESTINAL:  Negative.  The patient has history of reflux, but symptoms are not a problem at all recently.  She has no difficulty swallowing.  She reports normal bowel function.  She denies any recent history of hematochezia, hematemesis, melena.  MUSCULOSKELETAL:  Notable for chronic arthralgias particularly afflicting her back, her right hand.  She has been told that she may have spinal stenosis.  This does affect her ambulation some with chronic pain in her back radiating down to her legs.  NEUROLOGIC:  Negative.  The patient denies any symptoms suggestive of recent TIA or stroke.  GENITOURINARY:  Negative.  HEENT: Negative.  INFECTIOUS:  Negative.  PAST MEDICAL HISTORY: 1. Coronary artery disease. 2. Hypertension. 3. Hyperlipidemia. 4. Type 2 diabetes mellitus. 5. Cerebrovascular disease. 6. Peripheral arterial disease. 7. Infrarenal abdominal aortic aneurysm. 8. Chronic renal insufficiency. 9. GE reflux disease. 10.History of GI bleed, September 2011.  PAST SURGICAL HISTORY: 1. Left carotid endarterectomy. 2. Right femoral-popliteal  bypass. 3. Abdominal hysterectomy. 4. Right wrist surgery.  FAMILY HISTORY:  Notable for a strong presence of coronary artery disease and peripheral arterial disease.  SOCIAL HISTORY:  The patient is widowed and lives with her daughter in [place].  Her husband passed away after having difficult time following heart surgery.  She has a longstanding history of tobacco abuse.  The patient denies any significant alcohol history.  MEDICATIONS PRIOR TO ADMISSION: 1. Celexa 10 mg daily. 2. Fenofibrate 1 capsule daily. 3. Lasix 40 mg daily as needed. 4. Levaquin 500 mg daily. 5. Metoprolol 12.5 mg daily. 6. Prilosec 20 mg twice daily. 7. Pravachol 80 mg daily. 8. Desyrel 50 mg daily. 9. Coumadin, take as directed. 10.Amlodipine 10 mg daily. 11.Cartia XT 120 mg daily. 12.Oxygen daily at bedtime as needed.  DRUG ALLERGIES:  CODEINE causes itching and CLONIDINE unknown reaction.  PHYSICAL EXAMINATION:  GENERAL:  The patient is a mildly obese female who appears somewhat older than stated age, but in no acute distress. She is currently in sinus rhythm. HEENT:  Unrevealing. NECK:  Supple.  There is a well-healed carotid endarterectomy scar on the left side of the neck.  There is no palpable lymphadenopathy.  There are bilateral carotid bruits. CHEST:  Auscultation of the chest reveals clear breath sounds anteriorly.  No wheezes, rales, or rhonchi noted.  CARDIOVASCULAR: Notable for regular rate and rhythm.  There is grade 2/6 systolic murmur heard best at the sternal border. ABDOMEN:  Soft, nondistended, nontender.  The aorta is palpable, but does not seem to be terribly enlarged, but there is a pulsatile mass. EXTREMITIES:  Femoral pulses are not palpated due to catheterization today.  Distal pulses are not palpable in either lower leg at the ankle. There are well-healed scars in the right thigh from previous femoral- popliteal bypass.  There is no sign of venous insufficiency.  RECTAL  AND GU:  Both deferred. NEUROLOGIC:  Grossly nonfocal and symmetrical throughout.  A 2D echocardiogram performed earlier today is reviewed.  This has not been officially interpreted.  Left ventricular systolic function appears normal.  There is mild-to-moderate (2+) mitral regurgitation.  No other significant abnormalities are noted.  Left heart catheterization performed by Dr. Lia Foyer earlier today is reviewed.  This demonstrates critical left main disease with three- vessel coronary artery disease.  There is 90% distal stenosis of the left main coronary artery with 95% subtotal occlusion of the ostial portion of the left circumflex coronary artery.  Left anterior descending coronary artery is heavily calcified with 80-90% stenosis in the midportion and the distal portion of the vessel.  The left circumflex coronary artery gives rise to a single obtuse marginal branch, which appears to be occluded in the midportion and may be diffusely disease, may not be a graftable target.  The terminal portion of the left circumflex gives rise to the distal vessel, which is medium- sized.  There is right dominant coronary circulation.  There is 80% ostial stenosis of the right coronary artery.  There are luminal irregularities in the right coronary artery with 60-70% stenosis in the midportion of the vessel.  The left iliac artery was noted to be occluded at cath.  A total of only 27 mL of IV contrast was utilized during catheterization.  IMPRESSION:  Critical left main disease with three-vessel coronary artery disease and normal left ventricular function.  The patient has had a total of three episodes of prolonged chest pain at rest consistent with unstable angina.  I agree that she needs surgical revascularization.  She also has history of recurrent paroxysmal atrial fibrillation and might benefit from concomitant maze procedure.  Echocardiogram reveals moderate mitral regurgitation with what  appears to be type I dysfunction despite relatively normal left ventricular function.  This will need to be further assessed  intraoperatively using transesophageal echocardiogram.  Risks of surgery will be increased due to the patient's numerous comorbid medical problems including cerebrovascular disease, chronic renal insufficiency, peripheral arterial disease, chronic obstructive pulmonary disease, and ongoing tobacco abuse.  PLAN:  I have discussed matters at length with Ms. Rahal in her hospital room this evening.  She understands somewhat increased risks associated with surgery.  She also understands the indications and potential benefits of coronary artery bypass grafting.  We also discussed the indications and potential benefits of concomitant maze procedure.  All her questions have been addressed.  She understands and accepts all potential associated risks of surgery including, but not limited to risk of death, stroke, myocardial infarction, congestive heart failure, respiratory failure, renal failure, pneumonia, bleeding requiring blood transfusion, arrhythmia, heart block with bradycardia requiring permanent pacemaker, late recurrence of coronary artery disease, late recurrence of congestive heart failure, late recurrence of atrial fibrillation.  All of her questions have been addressed.  We plan to proceed with surgery tomorrow morning.  Valentina Gu. Roxy Manns, M.D.     CHO/MEDQ  D:  03/04/2011  T:  03/05/2011  Job:  [id number]  cc:   Shaune Pascal. Bensimhon, MD Dr. Charolotte Capuchin  Electronically Signed by Darylene Price M.D. on 03/05/2011 03:19:03 PM

## 2011-03-06 ENCOUNTER — Inpatient Hospital Stay (HOSPITAL_COMMUNITY): Payer: Medicare Other

## 2011-03-06 LAB — POCT I-STAT, CHEM 8
Glucose, Bld: 152 mg/dL — ABNORMAL HIGH (ref 70–99)
HCT: 27 % — ABNORMAL LOW (ref 36.0–46.0)
Hemoglobin: 9.2 g/dL — ABNORMAL LOW (ref 12.0–15.0)
Potassium: 4.4 mEq/L (ref 3.5–5.1)

## 2011-03-06 LAB — CBC
HCT: 25.3 % — ABNORMAL LOW (ref 36.0–46.0)
Hemoglobin: 8.8 g/dL — ABNORMAL LOW (ref 12.0–15.0)
MCHC: 34 g/dL (ref 30.0–36.0)
Platelets: 144 10*3/uL — ABNORMAL LOW (ref 150–400)
RBC: 3.08 MIL/uL — ABNORMAL LOW (ref 3.87–5.11)
RDW: 16.6 % — ABNORMAL HIGH (ref 11.5–15.5)
RDW: 16.7 % — ABNORMAL HIGH (ref 11.5–15.5)
WBC: 19.4 10*3/uL — ABNORMAL HIGH (ref 4.0–10.5)

## 2011-03-06 LAB — PREPARE FRESH FROZEN PLASMA
Unit division: 0
Unit division: 0
Unit division: 0

## 2011-03-06 LAB — TYPE AND SCREEN
Unit division: 0
Unit division: 0

## 2011-03-06 LAB — GLUCOSE, CAPILLARY
Glucose-Capillary: 102 mg/dL — ABNORMAL HIGH (ref 70–99)
Glucose-Capillary: 105 mg/dL — ABNORMAL HIGH (ref 70–99)
Glucose-Capillary: 112 mg/dL — ABNORMAL HIGH (ref 70–99)
Glucose-Capillary: 124 mg/dL — ABNORMAL HIGH (ref 70–99)
Glucose-Capillary: 55 mg/dL — ABNORMAL LOW (ref 70–99)
Glucose-Capillary: 91 mg/dL (ref 70–99)
Glucose-Capillary: 98 mg/dL (ref 70–99)

## 2011-03-06 LAB — PREPARE PLATELET PHERESIS

## 2011-03-06 LAB — CREATININE, SERUM
Creatinine, Ser: 1.64 mg/dL — ABNORMAL HIGH (ref 0.50–1.10)
GFR calc non Af Amer: 32 mL/min — ABNORMAL LOW (ref >60)

## 2011-03-06 LAB — BASIC METABOLIC PANEL
Chloride: 107 mEq/L (ref 96–112)
GFR calc Af Amer: 44 mL/min — ABNORMAL LOW (ref >60)
Potassium: 4.6 mEq/L (ref 3.5–5.1)

## 2011-03-06 LAB — MAGNESIUM: Magnesium: 2.6 mg/dL — ABNORMAL HIGH (ref 1.5–2.5)

## 2011-03-07 ENCOUNTER — Inpatient Hospital Stay (HOSPITAL_COMMUNITY): Payer: Medicare Other

## 2011-03-07 DIAGNOSIS — E1165 Type 2 diabetes mellitus with hyperglycemia: Secondary | ICD-10-CM

## 2011-03-07 DIAGNOSIS — IMO0001 Reserved for inherently not codable concepts without codable children: Secondary | ICD-10-CM

## 2011-03-07 LAB — COMPREHENSIVE METABOLIC PANEL
ALT: 15 U/L (ref 0–35)
AST: 71 U/L — ABNORMAL HIGH (ref 0–37)
Albumin: 2.8 g/dL — ABNORMAL LOW (ref 3.5–5.2)
Alkaline Phosphatase: 39 U/L (ref 39–117)
BUN: 20 mg/dL (ref 6–23)
CO2: 29 mEq/L (ref 19–32)
Calcium: 8.6 mg/dL (ref 8.4–10.5)
Chloride: 98 mEq/L (ref 96–112)
Creatinine, Ser: 1.74 mg/dL — ABNORMAL HIGH (ref 0.50–1.10)
GFR calc Af Amer: 36 mL/min — ABNORMAL LOW (ref >60)
GFR calc non Af Amer: 30 mL/min — ABNORMAL LOW (ref >60)
Glucose, Bld: 86 mg/dL (ref 70–99)
Potassium: 4.3 mEq/L (ref 3.5–5.1)
Sodium: 137 mEq/L (ref 135–145)
Total Bilirubin: 0.4 mg/dL (ref 0.3–1.2)
Total Protein: 5.7 g/dL — ABNORMAL LOW (ref 6.0–8.3)

## 2011-03-07 LAB — GLUCOSE, CAPILLARY
Glucose-Capillary: 118 mg/dL — ABNORMAL HIGH (ref 70–99)
Glucose-Capillary: 123 mg/dL — ABNORMAL HIGH (ref 70–99)
Glucose-Capillary: 157 mg/dL — ABNORMAL HIGH (ref 70–99)
Glucose-Capillary: 194 mg/dL — ABNORMAL HIGH (ref 70–99)
Glucose-Capillary: 95 mg/dL (ref 70–99)

## 2011-03-07 LAB — CBC
HCT: 24.9 % — ABNORMAL LOW (ref 36.0–46.0)
Hemoglobin: 8.6 g/dL — ABNORMAL LOW (ref 12.0–15.0)
MCH: 28.8 pg (ref 26.0–34.0)
MCHC: 34.5 g/dL (ref 30.0–36.0)
MCV: 83.3 fL (ref 78.0–100.0)
Platelets: 108 10*3/uL — ABNORMAL LOW (ref 150–400)
RBC: 2.99 MIL/uL — ABNORMAL LOW (ref 3.87–5.11)
RDW: 16.8 % — ABNORMAL HIGH (ref 11.5–15.5)
WBC: 17.4 10*3/uL — ABNORMAL HIGH (ref 4.0–10.5)

## 2011-03-08 ENCOUNTER — Encounter: Payer: Medicare Other | Admitting: *Deleted

## 2011-03-08 ENCOUNTER — Inpatient Hospital Stay (HOSPITAL_COMMUNITY): Payer: Medicare Other

## 2011-03-08 DIAGNOSIS — IMO0001 Reserved for inherently not codable concepts without codable children: Secondary | ICD-10-CM

## 2011-03-08 DIAGNOSIS — E1165 Type 2 diabetes mellitus with hyperglycemia: Secondary | ICD-10-CM

## 2011-03-08 LAB — CBC
HCT: 24.1 % — ABNORMAL LOW (ref 36.0–46.0)
Hemoglobin: 8.1 g/dL — ABNORMAL LOW (ref 12.0–15.0)
MCH: 28.4 pg (ref 26.0–34.0)
MCHC: 33.6 g/dL (ref 30.0–36.0)
MCV: 84.6 fL (ref 78.0–100.0)
Platelets: 125 10*3/uL — ABNORMAL LOW (ref 150–400)
RBC: 2.85 MIL/uL — ABNORMAL LOW (ref 3.87–5.11)
RDW: 16.2 % — ABNORMAL HIGH (ref 11.5–15.5)
WBC: 12.6 10*3/uL — ABNORMAL HIGH (ref 4.0–10.5)

## 2011-03-08 LAB — BASIC METABOLIC PANEL
BUN: 25 mg/dL — ABNORMAL HIGH (ref 6–23)
CO2: 37 mEq/L — ABNORMAL HIGH (ref 19–32)
Calcium: 9.4 mg/dL (ref 8.4–10.5)
Chloride: 96 mEq/L (ref 96–112)
Creatinine, Ser: 1.77 mg/dL — ABNORMAL HIGH (ref 0.50–1.10)
GFR calc Af Amer: 36 mL/min — ABNORMAL LOW (ref >60)
GFR calc non Af Amer: 30 mL/min — ABNORMAL LOW (ref >60)
Glucose, Bld: 68 mg/dL — ABNORMAL LOW (ref 70–99)
Potassium: 4.2 mEq/L (ref 3.5–5.1)
Sodium: 139 mEq/L (ref 135–145)

## 2011-03-08 LAB — GLUCOSE, CAPILLARY
Glucose-Capillary: 161 mg/dL — ABNORMAL HIGH (ref 70–99)
Glucose-Capillary: 121 mg/dL — ABNORMAL HIGH (ref 70–99)

## 2011-03-08 LAB — PROTIME-INR
INR: 1.84 — ABNORMAL HIGH (ref 0.00–1.49)
Prothrombin Time: 21.6 seconds — ABNORMAL HIGH (ref 11.6–15.2)

## 2011-03-09 ENCOUNTER — Inpatient Hospital Stay (HOSPITAL_COMMUNITY): Payer: Medicare Other

## 2011-03-09 DIAGNOSIS — Z954 Presence of other heart-valve replacement: Secondary | ICD-10-CM

## 2011-03-09 DIAGNOSIS — I2510 Atherosclerotic heart disease of native coronary artery without angina pectoris: Secondary | ICD-10-CM

## 2011-03-09 DIAGNOSIS — Z7901 Long term (current) use of anticoagulants: Secondary | ICD-10-CM

## 2011-03-09 DIAGNOSIS — I059 Rheumatic mitral valve disease, unspecified: Secondary | ICD-10-CM

## 2011-03-09 LAB — GLUCOSE, CAPILLARY
Glucose-Capillary: 106 mg/dL — ABNORMAL HIGH (ref 70–99)
Glucose-Capillary: 123 mg/dL — ABNORMAL HIGH (ref 70–99)

## 2011-03-09 LAB — CBC
HCT: 23.1 % — ABNORMAL LOW (ref 36.0–46.0)
Hemoglobin: 7.9 g/dL — ABNORMAL LOW (ref 12.0–15.0)
MCH: 29.2 pg (ref 26.0–34.0)
MCHC: 34.2 g/dL (ref 30.0–36.0)
MCV: 85.2 fL (ref 78.0–100.0)
Platelets: 163 10*3/uL (ref 150–400)
RBC: 2.71 MIL/uL — ABNORMAL LOW (ref 3.87–5.11)
RDW: 16.2 % — ABNORMAL HIGH (ref 11.5–15.5)
WBC: 10.7 10*3/uL — ABNORMAL HIGH (ref 4.0–10.5)

## 2011-03-09 LAB — BASIC METABOLIC PANEL
BUN: 22 mg/dL (ref 6–23)
CO2: 35 mEq/L — ABNORMAL HIGH (ref 19–32)
Calcium: 8.8 mg/dL (ref 8.4–10.5)
Chloride: 97 mEq/L (ref 96–112)
Creatinine, Ser: 1.46 mg/dL — ABNORMAL HIGH (ref 0.50–1.10)
GFR calc Af Amer: 45 mL/min — ABNORMAL LOW (ref >60)
GFR calc non Af Amer: 37 mL/min — ABNORMAL LOW (ref >60)
Glucose, Bld: 112 mg/dL — ABNORMAL HIGH (ref 70–99)
Potassium: 4.4 mEq/L (ref 3.5–5.1)
Sodium: 137 mEq/L (ref 135–145)

## 2011-03-09 LAB — PROTIME-INR
INR: 2.88 — ABNORMAL HIGH (ref 0.00–1.49)
Prothrombin Time: 30.6 seconds — ABNORMAL HIGH (ref 11.6–15.2)

## 2011-03-10 LAB — GLUCOSE, CAPILLARY
Glucose-Capillary: 125 mg/dL — ABNORMAL HIGH (ref 70–99)
Glucose-Capillary: 119 mg/dL — ABNORMAL HIGH (ref 70–99)

## 2011-03-10 LAB — PRO B NATRIURETIC PEPTIDE: Pro B Natriuretic peptide (BNP): 20027 pg/mL — ABNORMAL HIGH (ref 0–125)

## 2011-03-10 LAB — PROTIME-INR
INR: 2.72 — ABNORMAL HIGH (ref 0.00–1.49)
Prothrombin Time: 29.3 seconds — ABNORMAL HIGH (ref 11.6–15.2)

## 2011-03-11 DIAGNOSIS — IMO0001 Reserved for inherently not codable concepts without codable children: Secondary | ICD-10-CM

## 2011-03-11 DIAGNOSIS — E1165 Type 2 diabetes mellitus with hyperglycemia: Secondary | ICD-10-CM

## 2011-03-11 LAB — BASIC METABOLIC PANEL
BUN: 23 mg/dL (ref 6–23)
CO2: 40 mEq/L (ref 19–32)
Calcium: 9 mg/dL (ref 8.4–10.5)
Chloride: 86 mEq/L — ABNORMAL LOW (ref 96–112)
Creatinine, Ser: 1.77 mg/dL — ABNORMAL HIGH (ref 0.50–1.10)
GFR calc Af Amer: 36 mL/min — ABNORMAL LOW (ref >60)
GFR calc non Af Amer: 30 mL/min — ABNORMAL LOW (ref >60)
Glucose, Bld: 163 mg/dL — ABNORMAL HIGH (ref 70–99)
Potassium: 3.4 mEq/L — ABNORMAL LOW (ref 3.5–5.1)
Sodium: 134 mEq/L — ABNORMAL LOW (ref 135–145)

## 2011-03-11 LAB — PROTIME-INR
INR: 2.6 — ABNORMAL HIGH (ref 0.00–1.49)
Prothrombin Time: 28.3 seconds — ABNORMAL HIGH (ref 11.6–15.2)

## 2011-03-11 LAB — GLUCOSE, CAPILLARY
Glucose-Capillary: 121 mg/dL — ABNORMAL HIGH (ref 70–99)
Glucose-Capillary: 186 mg/dL — ABNORMAL HIGH (ref 70–99)
Glucose-Capillary: 152 mg/dL — ABNORMAL HIGH (ref 70–99)

## 2011-03-11 NOTE — Consult Note (Signed)
[name] NO.:  [id number]  MEDICAL RECORD NO.:  [id number]  LOCATION:  2016                         FACILITY:  [facility]  PHYSICIAN:  Teena Dunk, D.D.S.DATE OF BIRTH:  01-07-1951  DATE OF CONSULTATION:  03/09/2011 DATE OF DISCHARGE:                                CONSULTATION   HISTORY OF PRESENT ILLNESS:  [name] is a 60 year old female referred by Dr. Darylene Price for dental consultation.  The patient recently underwent 3-vessel coronary bypass graft procedure along with mitral valve repair.  Dental consultation is requested to rule out dental infection that may affect the patient's systemic health and previous heart valve repair surgery.  MEDICAL HISTORY: 1. Coronary artery disease.     a.     History of non-ST elevated MI in June 2012.     b.     Status post cardiac catheterization which revealed      significant left main disease as well as 3-vessel coronary artery      disease.     c.     Status post 3-vessel coronary artery bypass graft procedure,      mitral valve repair, and Cox Maze procedure with Dr. Roxy Manns on March 05, 2011. 2. History of congestive heart failure. 3. Mild-to-moderate mitral valve disease status post mitral valve     repair on March 05, 2011. 4. Recurrent paroxysmal atrial fibrillation since March 2011.  The     patient is status post Cox Maze procedure on March 05, 2011.  The     patient did have a history of Coumadin therapy. 5. Hypertension. 6. Hyperlipidemia. 7. COPD. 8. Peripheral arterial disease. 9. Anemia. 10.Chronic renal insufficiency. 11.Abdominal aortic aneurysm. 12.History of gastroesophageal reflux disorder. 13.History of GI bleeding, September 2011. 14.Diabetes mellitus type 2. 15.Status post left carotid endarterectomy with Dr. [name] on Feb 02, 2008. 16.Status post right fem-pop bypass. 17.Status post abdominal hysterectomy. 18.Status post right wrist surgery.  ALLERGIES/ADVERSE  DRUG REACTION: 1. CODEINE causes itching. 2. CONTRAST MEDIA caused previous reaction, although none recently.  MEDICATIONS: 1. Albuterol inhalation therapy 4 times daily. 2. Amiodarone 200 mg twice daily. 3. Norvasc 10 mg daily. 4. Aspirin 81 mg daily. 5. Dulcolax 10 mg daily. 6. Celexa 10 mg daily. 7. Colace 200 mg daily. 8. Fenofibrate 160 mg daily. 9. Lasix 40 mg daily. 10.Apresoline 10 mg 3 times daily. 11.NovoLog insulin per sliding scale. 12.Protonix 40 mg at noon. 13.Crestor 10 mg at bedtime. 14.Trazodone 50 mg at bedtime. 15.Coumadin per protocol.  SOCIAL HISTORY:  The patient is a widow and lives with her daughter in [place], [place].  The patient with longstanding tobacco abuse, smoking anywhere from 1/2 to 1-pack per day.  The patient denies use of alcohol at this time.  FAMILY HISTORY:  Mother with history of abdominal aortic aneurysm. Father with a history of coronary artery disease.  FUNCTIONAL ASSESSMENT:  The patient was independent for ADLs prior to this admission.  REVIEW OF SYSTEMS:  This was reviewed from the chart for this admission.  DENTAL HISTORY:  CHIEF COMPLAINT:  Dental consultation requested to rule out dental infection  that may affect the patient's systemic health and previous mitral valve surgery.  HISTORY OF PRESENT ILLNESS:  The patient with recent need for 3-vessel coronary artery bypass graft along with mitral valve repair and Cox Maze procedure.  Dental consultation was then requested to rule out dental infection that may affect the patient's systemic health and previous heart valve surgery.  The patient currently denies acute toothache, swellings, or abscesses. The patient indicates it has been "several years" since she last saw a dentist, although indicates that it is approximately 6-7 years ago since she had treatment.  The patient indicates that 6 teeth were pulled at that time by an oral surgeon (Dr. Diona Browner).  The  patient denies any complications.  The patient has been previously evaluated by some unknown primary dentist for possible denture fabrication, although she is not able to give the name of this dentist.  The patient denies having any dentures at this time.  DENTAL EXAMINATION:  GENERAL:  The patient is a well-developed, well- nourished female in no acute distress. VITAL SIGNS:  Blood pressure is 104/58, pulse 60-70, respirations are 19, and temperature 98.6. HEAD AND NECK:  There is no palpable submandibular lymphadenopathy. Patient has left TMJ crepitus but no acute TMJ symptoms. INTRAORAL:  The patient with normal saliva.  I do not see any evidence of abscess formation at this time. DENTITION:  The patient with multiple missing teeth, numbers 1-7, 13-16, 17, 18, 20, 29, 31, and 32.  There are multiple retained roots noted. DENTAL CARIES:  There are rampant dental caries affecting the remaining teeth. PERIODONTAL:  The patient with chronic periodontitis with plaque and calculus accumulations, generalized gingival recession, and generalized tooth mobility. ENDODONTIC:  The patient currently denies acute pulpitis symptoms.  The patient does appear to have multiple areas of periapical pathology and radiolucency at this time, specifically numbers 19 and 21. CROWN OR BRIDGE:  There was a crown restoration at tooth #30 with possible mesial caries. PROSTHODONTIC:  The patient denies presence of partial dentures, but is interested in future complete denture fabrication. OCCLUSION:  The patient with a poor occlusal scheme secondary to multiple missing teeth, multiple retained root segments, and lack of replacement of missing teeth with dental prostheses.  RADIOGRAPHIC IRRITATION:  The panoramic x-ray was taken.  There are multiple missing teeth.  There are multiple retained root segments.  There are multiple areas of periapical pathology and radiolucency.  There are multiple dental caries  noted.  There is supereruption and drifting of the unopposed teeth into the edentulous areas.  ASSESSMENT: 1. Chronic periodontitis with bone loss. 2. Plaque and calculus accumulations. 3. Generalized gingival recession. 4. Tooth mobility. 5. Multiple dental caries. 6. Multiple missing teeth. 7. Multiple retained root segments. 8. Multiple areas of periapical pathology but no acute symptoms or     abscess formation noted. 9. No history of partial dentures. 10.Poor occlusal scheme. 11.Current Coumadin therapy with risk for bleeding with invasive     dental procedures. 12.Recent heart surgery with questionable ability to proceed with     dental procedures at this time.  PLAN/RECOMMENDATIONS: 1. I discussed the risks, benefits, complications, and various     treatment options with the patient in relationship to her medical     and dental conditions and previous heart and heart valve surgeries.     We discussed the risks, benefits, complications, and various     treatment options to include no treatment, total or subtotal     extractions with  alveoloplasty, preprosthetic surgery as indicated,     periodontal therapy, dental restorations, root canal therapy, crown     or bridge therapy, implant therapy, and replacing missing teeth as     indicated after adequate healing.  The patient agrees with     proceeding with extraction of all remaining teeth with     alveoloplasty and preprosthetic surgery as indicated in the     operating room.  We will need to discuss this with the Thoracic     Surgery Team and coordinate future dental treatment, most likely in     approximately 1 month along with discontinuation of the Coumadin     therapy at that time.  If, however, the patient expresses acute     dental needs, we will consider moving forward with surgery before     that.  In the meantime, the patient will be placed on chlorhexidine     rinses using 10 mL 3 times daily in a swish and spit  manner.  This     should aid in the disinfection of the oral cavity until dental     procedures can be performed. 2. Discussion of findings with Dr. Darylene Price or other     cardiovascular thoracic surgery team members as indicated along     with future coordination of the dental procedures in the operating     room with general anesthesia in approximately 1 month from this     date, pending clearance from Dr. Darylene Price.          ______________________________ Teena Dunk, D.D.S.     RK/MEDQ  D:  03/09/2011  T:  03/09/2011  Job:  [id number]  cc:   Bernerd Limbo, M.D. Shaune Pascal. Bensimhon, MD  Electronically Signed by Teena Dunk D.D.S. on 03/11/2011 06:59:47 AM

## 2011-03-12 DIAGNOSIS — E1165 Type 2 diabetes mellitus with hyperglycemia: Secondary | ICD-10-CM

## 2011-03-12 DIAGNOSIS — IMO0001 Reserved for inherently not codable concepts without codable children: Secondary | ICD-10-CM

## 2011-03-12 LAB — BASIC METABOLIC PANEL
BUN: 25 mg/dL — ABNORMAL HIGH (ref 6–23)
CO2: 38 mEq/L — ABNORMAL HIGH (ref 19–32)
Calcium: 9.7 mg/dL (ref 8.4–10.5)
Chloride: 89 mEq/L — ABNORMAL LOW (ref 96–112)
Creatinine, Ser: 1.9 mg/dL — ABNORMAL HIGH (ref 0.50–1.10)
GFR calc Af Amer: 33 mL/min — ABNORMAL LOW (ref >60)
GFR calc non Af Amer: 27 mL/min — ABNORMAL LOW (ref >60)
Glucose, Bld: 155 mg/dL — ABNORMAL HIGH (ref 70–99)
Potassium: 4.4 mEq/L (ref 3.5–5.1)
Sodium: 135 mEq/L (ref 135–145)

## 2011-03-12 LAB — GLUCOSE, CAPILLARY
Glucose-Capillary: 116 mg/dL — ABNORMAL HIGH (ref 70–99)
Glucose-Capillary: 191 mg/dL — ABNORMAL HIGH (ref 70–99)
Glucose-Capillary: 109 mg/dL — ABNORMAL HIGH (ref 70–99)

## 2011-03-12 LAB — PROTIME-INR
INR: 2.16 — ABNORMAL HIGH (ref 0.00–1.49)
Prothrombin Time: 24.5 seconds — ABNORMAL HIGH (ref 11.6–15.2)

## 2011-03-13 LAB — BASIC METABOLIC PANEL
BUN: 33 mg/dL — ABNORMAL HIGH (ref 6–23)
CO2: 36 mEq/L — ABNORMAL HIGH (ref 19–32)
Calcium: 9.8 mg/dL (ref 8.4–10.5)
Chloride: 89 mEq/L — ABNORMAL LOW (ref 96–112)
Creatinine, Ser: 2.03 mg/dL — ABNORMAL HIGH (ref 0.50–1.10)
GFR calc Af Amer: 31 mL/min — ABNORMAL LOW (ref >60)
GFR calc non Af Amer: 25 mL/min — ABNORMAL LOW (ref >60)
Glucose, Bld: 101 mg/dL — ABNORMAL HIGH (ref 70–99)
Potassium: 4.2 mEq/L (ref 3.5–5.1)
Sodium: 136 mEq/L (ref 135–145)

## 2011-03-13 LAB — CBC
HCT: 30.3 % — ABNORMAL LOW (ref 36.0–46.0)
Hemoglobin: 9.9 g/dL — ABNORMAL LOW (ref 12.0–15.0)
MCH: 28.3 pg (ref 26.0–34.0)
MCHC: 32.7 g/dL (ref 30.0–36.0)
MCV: 86.6 fL (ref 78.0–100.0)
Platelets: 364 10*3/uL (ref 150–400)
RBC: 3.5 MIL/uL — ABNORMAL LOW (ref 3.87–5.11)
RDW: 16 % — ABNORMAL HIGH (ref 11.5–15.5)
WBC: 10.5 10*3/uL (ref 4.0–10.5)

## 2011-03-13 LAB — GLUCOSE, CAPILLARY
Glucose-Capillary: 132 mg/dL — ABNORMAL HIGH (ref 70–99)
Glucose-Capillary: 117 mg/dL — ABNORMAL HIGH (ref 70–99)

## 2011-03-13 LAB — PROTIME-INR
INR: 1.9 — ABNORMAL HIGH (ref 0.00–1.49)
Prothrombin Time: 22.1 seconds — ABNORMAL HIGH (ref 11.6–15.2)

## 2011-03-14 DIAGNOSIS — I4891 Unspecified atrial fibrillation: Secondary | ICD-10-CM

## 2011-03-14 LAB — GLUCOSE, CAPILLARY
Glucose-Capillary: 96 mg/dL (ref 70–99)
Glucose-Capillary: 123 mg/dL — ABNORMAL HIGH (ref 70–99)

## 2011-03-14 LAB — BASIC METABOLIC PANEL
BUN: 39 mg/dL — ABNORMAL HIGH (ref 6–23)
CO2: 34 mEq/L — ABNORMAL HIGH (ref 19–32)
Calcium: 9.5 mg/dL (ref 8.4–10.5)
Chloride: 88 mEq/L — ABNORMAL LOW (ref 96–112)
Creatinine, Ser: 2.25 mg/dL — ABNORMAL HIGH (ref 0.50–1.10)
GFR calc Af Amer: 27 mL/min — ABNORMAL LOW (ref >60)
GFR calc non Af Amer: 22 mL/min — ABNORMAL LOW (ref >60)
Glucose, Bld: 90 mg/dL (ref 70–99)
Potassium: 4 mEq/L (ref 3.5–5.1)
Sodium: 135 mEq/L (ref 135–145)

## 2011-03-14 LAB — PROTIME-INR
INR: 2.15 — ABNORMAL HIGH (ref 0.00–1.49)
Prothrombin Time: 24.4 seconds — ABNORMAL HIGH (ref 11.6–15.2)

## 2011-03-15 ENCOUNTER — Telehealth: Payer: Self-pay | Admitting: Internal Medicine

## 2011-03-15 LAB — BASIC METABOLIC PANEL
BUN: 39 mg/dL — ABNORMAL HIGH (ref 6–23)
CO2: 33 mEq/L — ABNORMAL HIGH (ref 19–32)
Calcium: 9.3 mg/dL (ref 8.4–10.5)
Chloride: 91 mEq/L — ABNORMAL LOW (ref 96–112)
Creatinine, Ser: 2.15 mg/dL — ABNORMAL HIGH (ref 0.50–1.10)
GFR calc Af Amer: 29 mL/min — ABNORMAL LOW (ref >60)
GFR calc non Af Amer: 24 mL/min — ABNORMAL LOW (ref >60)
Glucose, Bld: 122 mg/dL — ABNORMAL HIGH (ref 70–99)
Potassium: 4 mEq/L (ref 3.5–5.1)
Sodium: 133 mEq/L — ABNORMAL LOW (ref 135–145)

## 2011-03-15 LAB — PROTIME-INR
INR: 3.27 — ABNORMAL HIGH (ref 0.00–1.49)
Prothrombin Time: 33.8 seconds — ABNORMAL HIGH (ref 11.6–15.2)

## 2011-03-15 LAB — GLUCOSE, CAPILLARY: Glucose-Capillary: 122 mg/dL — ABNORMAL HIGH (ref 70–99)

## 2011-03-15 NOTE — Telephone Encounter (Signed)
pts daughter aware ok to see Richardson Dopp appt sch for 7/26

## 2011-03-15 NOTE — Telephone Encounter (Signed)
Pt daughter calling wanted to know if Dr. Haroldine Laws would be alright with pt seeing Richardson Dopp for 2wk fu following triple bypass.

## 2011-03-15 NOTE — Op Note (Signed)
[name], [name] NO.:  [id number]  MEDICAL RECORD NO.:  [id number]  LOCATION:  2306                         FACILITY:  [facility]  PHYSICIAN:  Valentina Gu. Roxy Manns, M.D. DATE OF BIRTH:  [birth year]  DATE OF PROCEDURE:  03/05/2011 DATE OF DISCHARGE:                              OPERATIVE REPORT   PREOPERATIVE DIAGNOSES: 1. 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 2. Recurrent paroxysmal atrial fibrillation. 3. Mitral regurgitation.  POSTOPERATIVE DIAGNOSES: 1. 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 2. Recurrent paroxysmal atrial fibrillation. 3. Mitral regurgitation.  PROCEDURE:  Median sternotomy for coronary artery bypass grafting x3 (left internal mammary artery to distal left anterior descending coronary artery, saphenous vein graft to obtuse marginal branch of the left circumflex coronary artery, saphenous vein graft to posterior descending coronary artery, endoscopic saphenous vein harvest from left thigh), a Cox maze procedure (complete biatrial lesion set with clipping of left atrial appendage), and mitral valve repair (Sorin Memo 3D ring annuloplasty, size 24 mm).  SURGEON:  Valentina Gu. Roxy Manns, MD  ASSISTANT:  Suzzanne Cloud, PA  ANESTHESIA:  Sharolyn Douglas, MD.  BRIEF CLINICAL NOTE:  The patient is a 60 year old female with multiple medical problems including coronary artery disease, hypertension, hyperlipidemia, longstanding tobacco abuse, chronic obstructive pulmonary disease, peripheral arterial disease, cerebrovascular disease, chronic renal insufficiency, abdominal aortic aneurysm, and recurrent paroxysmal atrial fibrillation.  The patient presents with recent onset symptoms of unstable angina complicated by class IV congestive heart failure.  2D echocardiogram demonstrates normal left ventricular systolic function with mild left ventricular hypertrophy and moderate mitral regurgitation.  Cardiac  catheterization demonstrates critical left main coronary artery disease with three-vessel coronary artery disease.  A full consultation note has been dictated previously.  The patient has been counseled regarding the indications, risks, and potential benefits of surgery.  Alternative treatment strategies have been discussed.  The patient provides full informed consent for the surgery as described.  OPERATIVE FINDINGS: 1. Normal left ventricular systolic function. 2. Moderate (3+) mitral regurgitation, type 1 dysfunction with pure     annular dilatation. 3. Good-quality left internal mammary artery conduit for grafting. 4. Small caliber, fair quality saphenous vein conduit for grafting. 5. Fair-to-good quality target vessels for grafting. 6. No residual mitral regurgitation following mitral valve repair.  OPERATIVE PROCEDURE IN DETAIL:  The patient was brought to the operating room on the above-mentioned date and central monitoring was established by the anesthesia team under the care and direction of Dr. Kate Sable.  Specifically, a Swan-Ganz catheter was placed through the right internal jugular approach.  A radial arterial line was placed. Intravenous antibiotics were administered.  The patient was placed in the supine position on the operating table.  General endotracheal anesthesia was induced uneventfully.  A Foley catheter was placed.  The patient's chest, abdomen, both groins, and both lower extremities were prepared and draped in sterile manner.  Baseline transesophageal echocardiogram was performed by Dr. Tamala Julian. This demonstrates normal left ventricular systolic function with mild left ventricular hypertrophy.  There is moderate (3+) mitral regurgitation.  The functional anatomy of the mitral valve was consistent with pure annular dilatation with normal leaflet mobility and  type 1 dysfunction.  The jet of regurgitation was central and courses all way across to the back  wall of the left atrium.  The aortic valve appears normal.  No other significant abnormalities were noted.  A median sternotomy incision was performed, and the left internal mammary artery was dissected from the chest wall and prepared for bypass grafting.  The left internal mammary artery is good-quality conduit. Simultaneously, saphenous vein was obtained from the patient's left thigh using endoscopic vein harvest technique through a small incision made just above the left knee.  The saphenous vein is small caliber and only fair quality conduit.  After the saphenous vein has been removed, the small incision in the left thigh was closed with absorbable suture. Following systemic heparinization, the left internal mammary artery was transected distally and noted to have excellent flow.  The pericardium was opened.  The ascending aorta is mildly sclerotic, but free of any significant calcifications.  The ascending aorta is cannulated for cardiopulmonary bypass.  The right common femoral vein was cannulated with a Seldinger technique, and a flexible guidewire was advanced under transesophageal echocardiogram guidance until the guidewire passes through the right atrium up the superior vena cava. The femoral vein was dilated with serial dilators and cannulated with a 22-French long femoral venous cannula.  A retrograde cardioplegic cannula was placed through the right atrium into the coronary sinus.  A right angle metal venous cannula was placed directly in the superior vena cava.  Cardiopulmonary bypass was begun.  The surface of the heart was inspected.  Distal target vessels were selected for coronary bypass grafting.  The AtriCure Frigitronics cryotherapy probe was now utilized to create the right atrial lesions of the Cox-CryoMaze procedure.  A longitudinal lesion was made along the lateral aspect of the right atrium extending from the superior vena cava to the inferior vena cava. A  second lesion was then placed in the oblique orientation extending from the lateral lesion in the anterior and inferior direction to the acute margin of the heart.  Each lesion was performed with 2-minute freeze duration.  A cardioplegic cannula was placed directly in the ascending aorta.  The aortic crossclamp was applied and cold blood cardioplegia is delivered initially in antegrade fashion through the aortic root.  Iced saline slush was applied for topical hypothermia.  Supplemental cardioplegia was administered retrograde through the coronary sinus catheter. Systemic cooling was begun to 32 degrees systemic temperature.  The initial cardioplegic arrest was rapid with early diastolic arrest. Repeat doses of cardioplegia administered intermittently throughout the entire crossclamp portion of the operation through the aortic root, down the subsequently placed vein grafts, or retrograde through the coronary sinus catheter to maintain completely flat electrocardiogram and left ventricular septal myocardial temperature below 15 degrees centigrade. Myocardial protection was felt to be excellent.  The heart was retracted towards the surgeon's side to expose the left- sided pulmonary veins.  The left-sided pulmonary veins were encircled and an elliptical ablation lesion was created with the AtriCure bipolar radiofrequency ablation clamp along the base of the left atrium around the left-sided pulmonary veins.  A similar elliptical lesion was then placed across the base of the left atrial appendage.  The left atrial appendage was then obliterated using the AtriCure left atrial appendage clip 35 mm size.  The following distal coronary anastomoses were now performed: 1. The obtuse marginal branch of the left circumflex coronary artery     is grafted with a saphenous vein graft in an end-to-side fashion.  This vessel measured 1.5 mm in diameter and is a fair-quality     target vessel for  grafting.  There is diffuse plaque proximally and     distally. 2. The posterior descending coronary artery is grafted with saphenous     vein graft in end-to-side fashion.  This vessel measured 1.4 mm in     diameter and is a fair-quality target vessel at the site of distal     grafting.  There is diffuse plaque proximally. 3. The distal left anterior descending coronary artery is grafted with     the left internal mammary artery in a end-to-side fashion.  This     vessel measures 1.5 mm in diameter and is a fair to good quality     target vessel at the site of distal grafting.  There is diffuse     plaque proximally and distally.  A left atriotomy incision was performed posteriorly through the interatrial groove and continued partway across the back wall of the left atrium after opening the oblique sinus inferiorly.  The floor of the left atrium and the mitral valve are now exposed using a self- retaining retractor.  Exposure was felt to be excellent.  The remainder of the left side lesions of the Cox maze procedure are now completed. The bipolar radiofrequency ablation clamp was utilized to create an elliptical lesion around the right-sided pulmonary veins.  The atriotomy incision serves as the anterior half of this ellipse, and the posterior half was completed with one limb of the bipolar clamp along the endocardial surface and the second limb along the epicardial surface posteriorly.  A box lesion was then completed with a bipolar clamp extending lesion across the dome of left atrium from the cephalad apex of the atriotomy incision to reach the cephalad apex of the ellipse surrounding left-sided pulmonary veins.  A similar lesion was created across the back wall of left atrium from the caudad apex of the atriotomy incision to reach the caudad apex of the ellipse surrounding the left-sided pulmonary veins.  Finally, a lesion is created across the back wall towards the mitral  annulus.  Finally, the cryotherapy probe was utilized to complete the lesion to the posterior mitral annulus, both endocardially, and epicardial over the Cox coronary sinus with two individual 3-minute to duration freeze lesions.  This completes the left- sided lesion set of the Cox maze procedure.  Attention now directed to the mitral valve.  The mitral valve leaflets are examined and appear essentially normal.  The mitral valve itself is somewhat small in size.  Ring annuloplasty was performed using interrupted 2-0 Ethibond horizontal mattress sutures placed circumferentially around the entire mitral valve annulus.  Initially, a 26-mm Memo 3D annuloplasty ring was slid into place.  However, with saline testing, the valve was still not competent as the 26-mm ring was actually slightly larger than the dimensions of the anterior leaflet. The 26-mm ring was then removed, and the Sorin Memo 3D annuloplasty ring size 24 mm (serial number [id number], catalog number [id number]) is now secured in place uneventfully.  The valve was not tested with saline and appears to be perfectly competent with a broad symmetrical line of coaptation of the anterior and posterior leaflet.  A sump drain was placed across the mitral valve to serve as a left ventricular vent.  The left atriotomy incision was closed posteriorly with a two-layer closure of running 3-0 Prolene suture.  Both proximal saphenous vein anastomoses were performed directly to the ascending  aorta prior to removal of the aortic crossclamp.  Left ventricular septal temperature rises rapidly with reperfusion of the left internal mammary artery graft.  One final dose of warm retrograde hot shot cardioplegia was administered.  The aortic crossclamp was removed after a total crossclamp time of 155 minutes.  The heart began to beat spontaneously without need for cardioversion. The retrograde cardioplegic cannula was removed, and the cryotherapy probe was  now passed through the small hole in the right atrium where the retrograde cannula was, and the final lesion of the right-sided lesion set is completed with the cryotherapy probe along the endocardial surface of the right atrium extending across the acute margin to reach the tricuspid annulus at the 2 o'clock position.  This completes the entire biatrial lesion set of the Cox maze procedure with the exception of the 10 o'clock lesion in the right atrium.  Epicardial pacing wires were fixed to the right ventricular free wall into the right atrial appendage.  All proximal and distal coronary anastomoses were inspected for hemostasis in appropriate graft orientation.  The lungs were ventilated and heart allowed to fill after which time the left ventricular vent was removed.  The patient was rewarmed to 37 degrees centigrade temperature.  Low-dose dopamine infusion was begun.  The superior vena cava cannula was removed uneventfully.  The patient was weaned from cardiopulmonary bypass without difficulty. The patient's rhythm at separation from bypass was AV paced.  Total cardiopulmonary bypass time for the operation was 184 minutes.  The patient is weaned from bypass on dopamine at 3 mcg/kg per minute. Followup transesophageal echocardiogram performed by Dr. Tamala Julian after separation from bypass demonstrates a well-seated annuloplasty ring in the mitral position.  There is no residual mitral regurgitation whatsoever.  Left ventricular function appears essentially unchanged from preoperatively.  No other abnormalities were noted.  The aortic cannula was removed uneventfully.  Protamine was administered to reverse the anticoagulation.  The femoral venous cannula was removed and manual pressure was held on the right groin for 30 minutes.  The mediastinum was irrigated with saline solution.  Meticulous surgical hemostasis was ascertained.  There is moderate coagulopathy.  The patient was  transfused two packs of adult platelets and 4 units fresh frozen plasma.  The mediastinum and left and right pleural spaces were drained with four chest tubes placed through separate stab incisions inferiorly.  The pericardium and soft tissues anterior to the aorta were reapproximated loosely.  The sternum was closed using double-strength sternal wire.  The soft tissues anterior to the sternum were closed in multiple layers, and the skin was closed using running subcuticular skin closure.  The patient tolerated the procedure well and was transported to the surgical intensive care unit in stable condition.  There were no intraoperative complications.  All sponge, instrument, and needle counts were verified correct at completion of the operation.  The patient was transfused 4 units packed red blood cells during cardiopulmonary bypass due to severe anemia present preoperatively and exacerbated by hemodilution.     Valentina Gu. Roxy Manns, M.D.     CHO/MEDQ  D:  03/05/2011  T:  03/06/2011  Job:  [id number]  cc:   Loretha Brasil. Lia Foyer, MD, [place] Bensimhon, MD Bernerd Limbo, M.D.  Electronically Signed by Darylene Price M.D. on 03/15/2011 08:53:19 AM

## 2011-03-15 NOTE — Telephone Encounter (Signed)
Pt's # unable to leave VM, Left message to call back  On Julie's #

## 2011-03-16 ENCOUNTER — Ambulatory Visit: Payer: Self-pay | Admitting: Internal Medicine

## 2011-03-16 LAB — POCT INR: INR: 4.4

## 2011-03-17 ENCOUNTER — Telehealth: Payer: Self-pay | Admitting: Internal Medicine

## 2011-03-17 NOTE — Telephone Encounter (Signed)
Spoke w/pt's daughter she states they were told pt would be prescribed Welbutrin when they were d/c'd to help with smoking cessation but they were not given an rx, it is not mentioned in hospital d/c will check w/Dr Bensimhon

## 2011-03-17 NOTE — Telephone Encounter (Signed)
Pt calling re wellbutrin rx, and had question re chf

## 2011-03-17 NOTE — Discharge Summary (Signed)
  [name], [name]                 ACCOUNT NO.:  [id number]  MEDICAL RECORD NO.:  [id number]  LOCATION:  2016                         FACILITY:  [facility]  PHYSICIAN:  Valentina Gu. Roxy Manns, M.D. DATE OF BIRTH:  [birth year]  DATE OF ADMISSION:  03/03/2011 DATE OF DISCHARGE:  03/15/2011                              DISCHARGE SUMMARY   ADDENDUM  The patient was dictated anticipation for discharge to home on March 13, 2011.  Discharge was held at this time secondary to creatinine increasing to 2.03 as well as continue to monitor heart rate and blood pressure.  On March 14, 2011, the patient's creatinine increased further to 2.25.  Her Lasix was held and then discontinued on March 15, 2011.  Her creatinine in a.m. of March 15, 2011, had decreased back down to 2.15. During this time, the patient has remained in normal sinus rhythm.  Her Lopressor had been discontinued and she was started on Coreg.  She was continued on hydralazine and Norvasc.  Blood pressures remained well controlled.  The patient was started on Flexeril for muscle spasms.  She states she does have a history of muscle spasms in the past.  States most recently it started back up again secondary to having to put her dog to sleep on Saturday.  The patient has been up ambulating well without difficulty.  She is tolerating diet well, no nausea or vomiting noted. Her INR level has increased to 3.27 today, March 15, 2011.  The patient's incisions are clean, dry, and intact and healing well.  The patient was seen and evaluated by Dr. Haroldine Laws and Dr. Roxy Manns this morning and both felt the patient stable and ready for discharge to home today, March 15, 2011.  Please see dictated discharge summary for followup appointments as well as discharging instructions.  DISCHARGE MEDICATIONS: 1. Amiodarone 200 mg daily. 2. Norvasc 10 mg daily. 3. Aspirin 81 mg daily. 4. Coreg 6.25 mg t.i.d. 5. Peridex 10 mL 1-2 times daily. 6. Flexeril 5 mg q.8 h. p.r.n.  muscle spasms. 7. Hydralazine 20 mg t.i.d. 8. Vicodin 5/325 one to two tablets q.3-4 h. p.r.n. pain. 9. Warfarin, the patient is to hold tonight on March 15, 2011, and then     restart warfarin at 0.5 mg at night on March 16, 2011.  Home health     nurse will draw PT/INR level on March 17, 2011, and fax the results     to Dr. Clayborne Dana office. 10.Citalopram 10 mg daily. 11.Fenofibric acid 134 mg daily. 12.Omeprazole 20 mg b.i.d. 13.Pravastatin 80 mg daily. 14.Trazodone 50 mg 1-2 tablets nightly p.r.n.     Iverson Alamin, PA   ______________________________ Valentina Gu. Roxy Manns, M.D.    KMD/MEDQ  D:  03/15/2011  T:  03/15/2011  Job:  [id number]  Electronically Signed by Harrel Lemon PA on 03/17/2011 12:28:20 PM Electronically Signed by Darylene Price M.D. on 03/17/2011 07:47:08 PM

## 2011-03-22 ENCOUNTER — Ambulatory Visit (INDEPENDENT_AMBULATORY_CARE_PROVIDER_SITE_OTHER): Payer: Self-pay

## 2011-03-22 DIAGNOSIS — R0989 Other specified symptoms and signs involving the circulatory and respiratory systems: Secondary | ICD-10-CM

## 2011-03-22 NOTE — Telephone Encounter (Signed)
Pt's daughter following up on call from 7-11, hasn't heard back, and now also want to know if she can get rx for fenofibrate? Uses [facility] [address] in [place]

## 2011-03-24 ENCOUNTER — Telehealth: Payer: Self-pay | Admitting: Internal Medicine

## 2011-03-24 NOTE — Telephone Encounter (Signed)
Pt has question re med

## 2011-03-25 NOTE — Cardiovascular Report (Signed)
[name], [name] NO.:  [id number]  MEDICAL RECORD NO.:  [id number]  LOCATION:  2909                         FACILITY:  [facility]  PHYSICIAN:  Loretha Brasil. Lia Foyer, MD, FACCDATE OF BIRTH:  08-23-1951  DATE OF PROCEDURE:  03/04/2011 DATE OF DISCHARGE:                           CARDIAC CATHETERIZATION   INDICATIONS:  The patient is complicated.  She recently had a Myoview. She developed significant ST depression with infusion of Lexiscan and significant chest discomfort.  She was admitted promptly to the hospital, developed recurrent chest pain, was sent to the CCU.  We have been trying to hydrate her to try to improve on her creatinine.  She has had bilateral renal stenting.  She also had catheterization in 2009 at which time Dr. Haroldine Laws used her left femoral artery.  She has had a left-to-right fem-pop bypass.  PROCEDURE: 1. Left heart catheterization 2. Selective coronary arteriography.  DESCRIPTION OF PROCEDURE: 1. We attempted to go from the left femoral artery.  She had an     excellent pulse.  We were able to get easy access through an     anterior puncture.  However, we could not pass a wire above the     aortoiliac junction, and a limited angiogram demonstrated an     occlusion of the iliac. 2. The coronary arteries are heavily calcified. 3. The left main demonstrates a hazy 90% stenosis at the mid to distal     portion of the vessel.  There is a hazy subtotal stenosis with     possible clot, proximal portion of the circumflex.  The LAD is     heavily calcified with about a 90% stenosis after the takeoff of     the diagonal and a second 80% stenosis noted more distally, which     was there previously as noted.  As noted, there are multiple areas     of plaque throughout the vessel, but no other critical stenoses.     The circumflex vessel demonstrates competitive flow into the first     marginal from retrograde collaterals from the RCA.  The AV  circumflex is smaller in caliber. 4. The right coronary artery is highly tortuous.  There is and a     heavily calcified 80% proximal stenosis followed by 50%.  There are     multiple tortuousities in the distal vessel with some suggestion of     the 70% stenosis, and there is 50-70% narrowing in the proximal     PDA.  There is a large posterior descending and posterolateral     branch as well.  CONCLUSIONS:  Marked progression of disease from 2009 with critical disease of the left main, ostial circ and high-grade disease of the left anterior descending artery.  There is also high-grade disease of the right coronary artery.  Dr. Haroldine Laws has come into the laboratory, and a surgical consultation has already been called.     Loretha Brasil. Lia Foyer, MD, [facility]     TDS/MEDQ  D:  03/04/2011  T:  03/05/2011  Job:  [id number]  cc:   Shaune Pascal. Bensimhon, MD Louis Meckel, M.D.  Electronically Signed by  Bing Quarry MD [facility] on 03/25/2011 07:25:11 AM

## 2011-03-26 ENCOUNTER — Other Ambulatory Visit: Payer: Self-pay | Admitting: Thoracic Surgery (Cardiothoracic Vascular Surgery)

## 2011-03-26 DIAGNOSIS — I2510 Atherosclerotic heart disease of native coronary artery without angina pectoris: Secondary | ICD-10-CM

## 2011-03-26 NOTE — Telephone Encounter (Signed)
Pt states she is on her way to pcp if he wont give her rx she will call me back

## 2011-03-26 NOTE — Telephone Encounter (Signed)
See phone mess dated 7/11

## 2011-03-26 NOTE — Telephone Encounter (Signed)
Ok to use wellbutrin 150 bid.Can restart fenofibrate if on previously. Otherwise we can start at f/u visit.

## 2011-03-27 NOTE — Discharge Summary (Signed)
[name], [name]                 ACCOUNT NO.:  [id number]  MEDICAL RECORD NO.:  [id number]  LOCATION:  2016                         FACILITY:  [facility]  PHYSICIAN:  Valentina Gu. Roxy Manns, M.D. DATE OF BIRTH:  [birth year]  DATE OF ADMISSION:  03/03/2011 DATE OF DISCHARGE:                              DISCHARGE SUMMARY   PRIMARY ADMITTING DIAGNOSIS:  Critical left main coronary disease.  ADDITIONAL/DISCHARGE DIAGNOSES: 1. Left main and three-vessel coronary artery disease. 2. Mitral regurgitation. 3. Recurrent paroxysmal atrial fibrillation. 4. Chronic periodontitis with dental caries. 5. Hypertension. 6. Hyperlipidemia. 7. Chronic obstructive pulmonary disease. 8. Peripheral vascular disease, status post prior femoral-popliteal     bypass. 9. Infrarenal abdominal aortic aneurysm. 10.Type 2 diabetes mellitus. 11.Gastroesophageal reflux disease. 12.History of gastrointestinal bleed in September 2011. 13.Longstanding history of tobacco abuse. 14.ECCVOD with bilateral 60-79% internal carotid artery stenoses. 15.Infrarenal abdominal aortic aneurysm. 16.Postoperative bradycardia, resolving. 17.Postoperative pulmonary edema, resolving. 18.Postoperative acute blood loss anemia. 19.Class IV congestive heart failure.  PROCEDURES PERFORMED: 1. Cardiac catheterization. 2. Coronary artery bypass grafting x3 (left internal mammary artery to     the distal LAD, saphenous vein graft to the obtuse marginal,     saphenous vein graft to the posterior descending). 3. Endoscopic vein harvest, left thigh. 4. Cox maze procedure with complete biatrial lesions set. 5. Clipping of left atrial appendage. 6. Mitral valve repair with Sorin memo 3-D ring annuloplasty size 24-     mm.  HISTORY:  The patient is a 60 year old female with multiple medical problems.  She has a history of coronary artery disease and previously underwent cardiac catheterization in September 2001, at that time, was noted to  have single-vessel coronary artery disease which was treated medically.  In March 2011, she developed recurrent paroxysmal atrial fibrillation and was treated by Dr. Haroldine Laws.  She currently is on long- term anticoagulation with Coumadin.  Echocardiogram has demonstrated normal left ventricular systolic function with mild left ventricular hypertrophy.  She has had a GI bleed in September 2011, treated with EGD and cauterization and has since resumed Coumadin with no bleeding difficulties.  Few weeks prior to this admission, she developed symptoms of exertional fatigue with substernal chest pain with exertion.  She has also had worsening shortness of breath with exertion and recently resting shortness of breath and orthopnea.  She was hospitalized at [facility] with congestive heart failure and treated with diuretic therapy with improvement.  She apparently had positive cardiac enzymes during that admission consistent with an acute ST- segment elevation myocardial infarction.  She was discharged with instructions to follow up with Dr. Haroldine Laws.  She was seen in their office 2 days prior to this admission and underwent a stress Myoview exam.  During this study, she developed chest pain with significant ST- segment depression and was subsequently admitted for further management.  HOSPITAL COURSE:  The patient was admitted to [facility].  She was noted on admission to have a baseline creatinine of 2.7 and was treated with hydration.  She underwent an echocardiogram which showed normal left ventricular systolic function.  She subsequently underwent cardiac catheterization on March 04, 2011, by Dr. Lia Foyer and was  found to have critical left main disease and severe three-vessel coronary artery disease.  She was felt to be a poor candidate for percutaneous intervention.  A Cardiac Surgery consult was obtained and the patient was seen by Dr. Darylene Price.  Her  films were reviewed and Dr. Roxy Manns felt this the patient would require coronary artery bypass grafting.  He explained all risks, benefits and alternatives of surgery to the patient and she agreed to proceed.  It was also felt that in light of her atrial fibrillation, she should undergo a maze procedure at the time of surgery.  She remained stable and underwent a preoperative workup which included carotid Dopplers showing a bilateral 60-79% ICA stenosis.  She was taken to the operating room on March 05, 2011, and underwent CABG x3 as well as a Cox maze procedure performed by Dr. Roxy Manns.  A baseline TEE intraoperatively revealed moderate 3+ mitral regurgitation with type 1 dysfunction.  Because of this, she also underwent mitral valve repair as described above.  Please see previously dictated operative report for complete details of her surgery.  She tolerated the procedure well and was transferred to the SICU in stable condition.  She was able to be extubated shortly after surgery.  She was hemodynamically stable and doing well on postop day 1.  She remained in the unit for further observation.  She was started back on low-dose Coumadin and p.o. amiodarone.  Her postoperative course has been prolonged by some bradycardia with junctional bradycardia and sinus bradycardia with heart rates in the 40s requiring atrial pacing.  She otherwise remained stable and her pacer was slowly weaned.  She was able to be transferred to this Step-Down Unit on postop day 3.  Her heart rate continued to remain slow and her amiodarone dose was decreased.  She was also not put on a beta- blocker postoperatively secondary to her bradycardia.  She has had some hypertension and has been started on hydralazine as well as amlodipine with improvement of her blood pressures.  At present, her pacer has been weaned and discontinued and she is maintaining intrinsic heart rates in the 60s.  Her blood pressures have remained in  the [id number] systolic range on her current medication regimen.  Her pacing wires have been discontinued.  She has been continued on Coumadin and her INR presently is 2.16.  Her incisions are all healing well.  She has remained afebrile and her vital signs have been stable.  She has been somewhat volume overloaded postoperatively with pulmonary edema on chest x-ray which is resolving and elevated BNP most recently noted to be 20,027 on March 10, 2011.  She is being diuresed with good results.  Her renal function has trended downward and has stayed stable with between 1.7 and 1.9 postoperatively.  Her blood sugars have been stable and diet control and sliding scale insulin.  She is ambulating the halls with PT and with cardiac rehab phase one and is making good progress.  She has been seen in consultation postoperatively by Dr. Enrique Sack of the [facility] for poor dentition.  It is felt that she will ultimately require resection and extraction, but at the present time because of her Coumadin therapy, it is felt that she will not be able to proceed immediately.  She has been started on Chlorhexidine rinses and this will continue until she is able to see Dr. Enrique Sack back as an outpatient. Hopefully, they would like to proceed at approximately a month from the time  of discharge pending clearance from a cardiac surgery standpoint. Her most recent labs on March 12, 2011, show sodium of 135, potassium 4.4, BUN 25, creatinine 1.9, PT 24.5, INR 2.16.  Her latest CBC showed a white count 10.7, hemoglobin 7.9, hematocrit 23.1 and platelets 163. Her latest chest x-ray shows improving edema with small bilateral effusions and bibasilar atelectasis which is resolving.  She overall continues to progress well.  We will observe her rhythm over the next 24 hours and follow her INR.  We will also check a CBC and BMET on March 13, 2011.  If she remained stable and no other acute changes occur, we anticipate  discharge home on March 13, 2011.  DISCHARGE MEDICATIONS: 1. Amiodarone 200 mg daily. 2. Amlodipine 10 mg daily. 3. Aspirin 81 mg daily. 4. Lasix 40 mg daily x5 days. 5. Hydralazine 10 mg t.i.d. 6. Vicodin 1-2 q.3-4 h. p.r.n. pain. 7. Coumadin 2 mg daily or as directed. 8. Celexa 10 mg daily. 9. TriCor 134 mg nightly. 10.Omeprazole 20 mg b.i.d. 11.Pravachol 80 mg daily. 12.Trazodone 50 mg 1-2 tabs nightly p.r.n. 13.Chlorhexidine rinses 10 mL t.i.d. until seen by Dr. Enrique Sack.  DISCHARGE INSTRUCTIONS:  She is asked to follow up with the [facility] on March 15, 2011, for management of her anticoagulation. She should also need to make an appointment to see Dr. Haroldine Laws in the next 2 weeks.  She will follow up in 3 weeks with Dr. Roxy Manns with a chest x-ray.  She will also follow up as directed with Dr. Enrique Sack.  In the interim, if she experiences any problems or has questions, she is asked to contact our office.     Suzzanne Cloud, P.A.   ______________________________ Valentina Gu. Roxy Manns, M.D.    GC/MEDQ  D:  03/12/2011  T:  03/12/2011  Job:  [id number]  cc:   Shaune Pascal. Bensimhon, MD Bernerd Limbo, M.D. Teena Dunk, D.D.S.  Electronically Signed by Micheline Maze. on 03/26/2011 03:12:06 PM Electronically Signed by Darylene Price M.D. on 03/27/2011 03:03:19 PM

## 2011-03-29 ENCOUNTER — Ambulatory Visit (INDEPENDENT_AMBULATORY_CARE_PROVIDER_SITE_OTHER): Payer: Self-pay | Admitting: Thoracic Surgery (Cardiothoracic Vascular Surgery)

## 2011-03-29 ENCOUNTER — Ambulatory Visit
Admission: RE | Admit: 2011-03-29 | Discharge: 2011-03-29 | Disposition: A | Discharge status: Home / Self Care | Payer: Medicare Other | Source: Ambulatory Visit | Attending: Thoracic Surgery (Cardiothoracic Vascular Surgery) | Admitting: Thoracic Surgery (Cardiothoracic Vascular Surgery)

## 2011-03-29 ENCOUNTER — Encounter: Payer: Self-pay | Admitting: Physician Assistant

## 2011-03-29 DIAGNOSIS — Z09 Encounter for follow-up examination after completed treatment for conditions other than malignant neoplasm: Secondary | ICD-10-CM | POA: Insufficient documentation

## 2011-03-29 DIAGNOSIS — I2510 Atherosclerotic heart disease of native coronary artery without angina pectoris: Secondary | ICD-10-CM

## 2011-03-29 NOTE — Progress Notes (Signed)
Patient returns for routine followup status post coronary artery bypass grafting x3, mitral valve repair, and Maze procedure on 03/05/2011. Her postoperative recovery was notable for some acute on chronic renal insufficiency which resolved uneventfully. She was also seen by Dr. Dorothyann Gibbs from the dental service during her hospitalization, and she will be following up with him postoperatively. Since hospital discharge she has done quite well. She reports that she is ambulating daily and getting along quite well with good exercise tolerance. She denies shortness of breath. She still has significant soreness in her chest, particularly on the right side. This has been slowly improving. Her appetite is good although she complains about of salt restriction. She is not smoking. Overall she has no complaints. She did have one brief episode of tachypalpitations which broke within 30 minutes after taking her routine medications when the evening last week. Otherwise she has not had any other tachypalpitations. Medications remain unchanged from the time of hospital discharge, although her Coumadin dose is being monitored and adjusted as needed  On physical exam the patient looks quite good. Her pressure 136/71, pulse 62, and rhythm strip demonstrates normal sinus rhythm. Oxygen saturation is 98% on room air. The sternal incision is healing nicely. Breath sounds are clear to auscultation. Cardiovascular exam is notable for regular rate and rhythm. No murmurs are appreciated. Her extremities are warm and well-perfused. There is no lower extremity edema. The small incision from endoscopic vein harvest is healing nicely.  Chest x-ray performed today demonstrates clear lung fields bilaterally with exception of a very small left pleural effusion. All of the sternal wires appear intact.  Overall the patient appears to be doing quite well.

## 2011-03-29 NOTE — Patient Instructions (Signed)
The patient is reminded to continue to refrain from any heavy lifting or strenuous use of her arms or shoulders for at least another 2 months. Once she gets to the point where she no longer requires pain relievers during the daytime, I think it would be reasonable for her to resume driving an automobile. However, as long as she continues to have soreness in her chest and his pain relievers during the daytime, she probably should not be driving. We have not made any changes in her current medications.

## 2011-04-01 ENCOUNTER — Ambulatory Visit (INDEPENDENT_AMBULATORY_CARE_PROVIDER_SITE_OTHER): Payer: Medicare Other | Admitting: *Deleted

## 2011-04-01 ENCOUNTER — Ambulatory Visit (INDEPENDENT_AMBULATORY_CARE_PROVIDER_SITE_OTHER): Payer: Medicare Other | Admitting: Physician Assistant

## 2011-04-01 ENCOUNTER — Encounter: Payer: Self-pay | Admitting: Physician Assistant

## 2011-04-01 VITALS — BP 153/74 | HR 65 | Resp 16 | Ht 65.0 in | Wt 138.0 lb

## 2011-04-01 DIAGNOSIS — I4891 Unspecified atrial fibrillation: Secondary | ICD-10-CM

## 2011-04-01 DIAGNOSIS — N189 Chronic kidney disease, unspecified: Secondary | ICD-10-CM

## 2011-04-01 DIAGNOSIS — I5032 Chronic diastolic (congestive) heart failure: Secondary | ICD-10-CM

## 2011-04-01 DIAGNOSIS — R0609 Other forms of dyspnea: Secondary | ICD-10-CM

## 2011-04-01 DIAGNOSIS — I2510 Atherosclerotic heart disease of native coronary artery without angina pectoris: Secondary | ICD-10-CM

## 2011-04-01 DIAGNOSIS — I509 Heart failure, unspecified: Secondary | ICD-10-CM

## 2011-04-01 DIAGNOSIS — I739 Peripheral vascular disease, unspecified: Secondary | ICD-10-CM

## 2011-04-01 DIAGNOSIS — F17200 Nicotine dependence, unspecified, uncomplicated: Secondary | ICD-10-CM

## 2011-04-01 DIAGNOSIS — E785 Hyperlipidemia, unspecified: Secondary | ICD-10-CM

## 2011-04-01 DIAGNOSIS — I10 Essential (primary) hypertension: Secondary | ICD-10-CM

## 2011-04-01 MED ORDER — HYDRALAZINE HCL 50 MG PO TABS: 50.0000 mg | ORAL_TABLET | Freq: Three times a day (TID) | ORAL | Status: DC

## 2011-04-01 NOTE — Assessment & Plan Note (Signed)
Managed by PCP

## 2011-04-01 NOTE — Assessment & Plan Note (Signed)
Volume is stable.  She is currently not on any Lasix.  Check a basic metabolic panel and BNP today.

## 2011-04-01 NOTE — Progress Notes (Signed)
History of Present Illness: Primary Cardiologist:  Dr. Glori Bickers PCP:  Dr. Bernerd Limbo  [name] is a 60 y.o. female with severe PVD followed by Dr. [name], COPD, HTN, CAD with borderline LAD lesion previously manged medically, PAF, CKD, prior GI bleed and markedly abnormal ECG with persistent severe T wave inversions.  She was admitted to [facility] 6/12 with COPD exacerbation and CHF.  An echo demonstrated normal LVF with concentric LVH.  She also ruled in for a NSTEMI but was treated medically with her CKD.  I saw her in follow up and set her up for a Lexiscan Myoview scan for risk stratification.  This was markedly abnormal with ongoing chest pain, diffuse ST changes and large areas of ischemia.  She was admitted directly from the office to [facility].    Cardiac catheterization was eventually performed on 6/29 after IV hydration for her CKD.  She had a 90% mid to dLM stenosis, a subtotally occluded pCFX, 90% LAD stenosis after the Dx, then 80%.  Proximal RCA 80%, 50%, dRCA 70%, pPDA 50-70%.  Echocardiogram 6/28: EF 50-55% with mild mitral regurgitation.  She was referred for bypass surgery.  This was done by Dr.  Roxy Manns.  Grafts:  L-LAD, S-OM, S-PDA.  She also had an intraoperative TEE.  This demonstrated 3+ mitral regurgitation.  She therefore underwent mitral valve repair.  Given her atrial fibrillation, she also had a Cox Maze procedure with left atrial appendage clipping.    Her postoperative course was complicated by volume overload.  She was diuresed.  She had problems with bradycardia postop.  She was atrial paced for sometime.  Beta blocker was discontinued.  However, her HR did recover and she was eventually placed back on Coreg.  Her BP also  recovered and some of her antihypertensives were restarted.  She did have improvement in her creatinine with diuresis.  However, prior to discharge her creatinine began to rise again.  Her medications were adjusted.  Discharge creatinine was 2.15.  She  was maintained on amiodarone for her atrial fibrillation.  She was also restarted on Coumadin.  She presents for follow up.  Doing ok.  Chest is sore.  Breathing ok.  CXR with Dr. Roxy Manns last week with improved effusions.  She still sleeps in recliner due to pain.  She denies PND.  No edema.  No syncope.  No fevers or chills.    Past Medical History  Diagnosis Date  . Coronary artery disease 5/09    s/p CABG 7/12   L-LAD, S-OM, S-PDA  . PAD (peripheral artery disease)     Severe; s/p bilateral renal artery stents, moderate in-stent restenosis  . AAA (abdominal aortic aneurysm) 5/09    3.7x3.3 by u/s 2009  . carotid stenosis 5/09    S/p L CEA ;  60-79% bilat ICA by preCABG dopplers 7/12  . Hypertension   . Hyperlipidemia   . COPD (chronic obstructive pulmonary disease)   . GERD (gastroesophageal reflux disease)     With hiatal hernia  . DM2 (diabetes mellitus, type 2)   . Paroxysmal atrial fibrillation     coumadin;  echo 9/07: EF 60%, mild LVH;  s/p Cox Maze 7/12 with LAA clipping  . GIB (gastrointestinal bleeding) 9/11    S/p EGD with cautery at [facility]  . Sleep apnea   . Abnormal EKG     deep TW inversions chronic  . CKD (chronic kidney disease)   . Mitral regurgitation     3+  MR by intraoperative TEE;  s/p MV repair with Dr. Roxy Manns 7/12    Current Outpatient Prescriptions  Medication Sig Dispense Refill  . amiodarone (PACERONE) 200 MG tablet Take 200 mg by mouth daily.        Marland Kitchen amLODipine (NORVASC) 10 MG tablet Take 10 mg by mouth daily.        Marland Kitchen aspirin 81 MG tablet Take 81 mg by mouth daily.        Marland Kitchen buPROPion (ZYBAN) 150 MG 12 hr tablet Take 150 mg by mouth daily.        . carvedilol (COREG) 6.25 MG tablet Take 6.25 mg by mouth 2 (two) times daily with a meal.        . citalopram (CELEXA) 10 MG tablet Take 20 mg by mouth daily.       . cyclobenzaprine (FLEXERIL) 5 MG tablet Take 5 mg by mouth 3 (three) times daily as needed.        . docusate sodium (COLACE) 100 MG capsule Take  100 mg by mouth 2 (two) times daily.        . fenofibrate micronized (LOFIBRA) 134 MG capsule Take 134 mg by mouth at bedtime.        . hydrocodone-acetaminophen (ZYDONE) 5-400 MG per tablet Take 1 tablet by mouth every 6 (six) hours as needed.        . NON FORMULARY Oxigen 2 liters, take at bedtime.       Marland Kitchen omeprazole (PRILOSEC) 20 MG capsule Take 20 mg by mouth 2 (two) times daily.        . pravastatin (PRAVACHOL) 80 MG tablet Take 80 mg by mouth at bedtime.        . traZODone (DESYREL) 50 MG tablet Take 50 mg by mouth at bedtime as needed.        . warfarin (COUMADIN) 1 MG tablet 0.5 mg. Take as directed by Anticoagulation clinic        . DISCONTD: hydrALAZINE (APRESOLINE) 10 MG tablet Take 10 mg by mouth. 2 po tid       . hydrALAZINE (APRESOLINE) 50 MG tablet Take 1 tablet (50 mg total) by mouth 3 (three) times daily.  90 tablet  3    Allergies: Allergies  Allergen Reactions  . Clonidine Hydrochloride   . Codeine     REACTION: itching    Social Hx:  She quit smoking.  She is now on Wellbutrin per her PCP  Vital Signs: BP 153/74  Pulse 65  Resp 16  Ht 5\' 5"  (1.651 m)  Wt 138 lb (62.596 kg)  BMI 22.96 kg/m2  PHYSICAL EXAM: Well nourished, well developed, in no acute distress HEENT: normal Neck: no JVD, + left CEA scar Cardiac:  normal S1, S2; RRR; no murmur Chest:  Median sternotomy scar well healed Lungs:  clear to auscultation bilaterally, no wheezing, rhonchi or rales Abd: soft, nontender, no hepatomegaly Ext: no edema Skin: warm and dry Neuro:  CNs 2-12 intact, no focal abnormalities noted  EKG:   Sinus rhythm, heart rate 66, normal axis, T wave inversions in 1, aVL, V4-V6, incomplete right bundle branch block  ASSESSMENT AND PLAN:

## 2011-04-01 NOTE — Assessment & Plan Note (Signed)
She has not followed up with nephrology in quite some time.  She would like to see someone closer to home in [facility].  We will try to make that referral.

## 2011-04-01 NOTE — Assessment & Plan Note (Signed)
Stable post bypass.  Continue ASA and statin.  Refer to cardiac rehab.  Follow up with Dr. Haroldine Laws in 6-8 weeks.

## 2011-04-01 NOTE — Assessment & Plan Note (Signed)
Maintaining NSR after [facility] M[facility].  Remains on Amiodarone.  Also, on coumadin.  May be able to d/c coumadin at some point in future if maintains NSR.  Defer that decision to Dr. Haroldine Laws.  Will likely d/c amiodarone at next OV if still in NSR.

## 2011-04-01 NOTE — Assessment & Plan Note (Signed)
She has quit smoking.  She is on Wellbutrin.

## 2011-04-01 NOTE — Assessment & Plan Note (Signed)
She has noted elevated blood pressures at home.  Increase hydralazine to 50 mg 3 times a day.

## 2011-04-01 NOTE — Patient Instructions (Signed)
Your physician has recommended you make the following change in your medication: INCREASE HYDRALAZINE 50 MG 1 TABLET THREE TIMES DAILY.  You have been referred to [place] [facility] [place] DX 414.01, 401.9  You have been referred to [place].  Your physician recommends that you return for lab work in: [place] BMET, BNP 414.01, 401.9  Your physician recommends that you schedule a follow-up appointment in: [place] Haroldine Laws 06/01/11 @ 11:00 AM AS PER HEATHER SCHUB, RN

## 2011-04-01 NOTE — Assessment & Plan Note (Signed)
Followup with Dr. [name].

## 2011-04-02 LAB — BASIC METABOLIC PANEL
BUN: 23 mg/dL (ref 6–23)
Calcium: 9.6 mg/dL (ref 8.4–10.5)
Creatinine, Ser: 1.8 mg/dL — ABNORMAL HIGH (ref 0.4–1.2)
GFR: 31.73 mL/min — ABNORMAL LOW (ref >60.00)
Glucose, Bld: 77 mg/dL (ref 70–99)

## 2011-04-15 ENCOUNTER — Encounter: Payer: Medicare Other | Admitting: *Deleted

## 2011-04-27 ENCOUNTER — Encounter (INDEPENDENT_AMBULATORY_CARE_PROVIDER_SITE_OTHER): Payer: Medicare Other

## 2011-04-27 ENCOUNTER — Encounter (INDEPENDENT_AMBULATORY_CARE_PROVIDER_SITE_OTHER): Payer: Medicare Other | Admitting: Vascular Surgery

## 2011-04-27 ENCOUNTER — Other Ambulatory Visit (INDEPENDENT_AMBULATORY_CARE_PROVIDER_SITE_OTHER): Payer: Medicare Other

## 2011-04-27 DIAGNOSIS — Z48812 Encounter for surgical aftercare following surgery on the circulatory system: Secondary | ICD-10-CM

## 2011-04-27 DIAGNOSIS — I714 Abdominal aortic aneurysm, without rupture: Secondary | ICD-10-CM

## 2011-04-27 DIAGNOSIS — I739 Peripheral vascular disease, unspecified: Secondary | ICD-10-CM

## 2011-04-27 DIAGNOSIS — I6529 Occlusion and stenosis of unspecified carotid artery: Secondary | ICD-10-CM

## 2011-04-28 ENCOUNTER — Other Ambulatory Visit: Payer: Self-pay

## 2011-05-04 NOTE — Procedures (Unsigned)
DUPLEX ULTRASOUND OF ABDOMINAL AORTA  INDICATION:  Followup abdominal aortic aneurysm.  HISTORY: Diabetes:  No. Cardiac:  CAD. Hypertension:  Yes. Smoking:  Currently. Connective Tissue Disorder: Family History:  No. Previous Surgery:  Left carotid endarterectomy 02/02/2008, bilateral renal artery stents in 2004.  DUPLEX EXAM:         AP (cm)                   TRANSVERSE (cm) Proximal             2.43 cm                   2.22 cm Mid                  2.48 cm                   2.62 cm Distal               4.03 cm                   4.00 cm Right Iliac          1.10 cm                   1.03 cm Left Iliac           1.17 cm                   1.03 cm  PREVIOUS:  Date:  04/28/2010  AP:  3.5  TRANSVERSE:  3.5  IMPRESSION: 1. Distal abdominal aortic aneurysm present measuring 4.03 cm x 4.00     cm without intramural thrombus present. 2. Remainder of abdominal aorta and proximal common iliac arteries are     within normal diameters. 3. Slight increase in aneurysm diameter since previous study on     04/28/2010.  ___________________________________________ [name]. [name], M.D.  SH/MEDQ  D:  04/27/2011  T:  04/27/2011  Job:  [id number]

## 2011-05-04 NOTE — Procedures (Unsigned)
CAROTID DUPLEX EXAM  INDICATION:  Followup carotid stenosis.  HISTORY: Diabetes:  No. Cardiac:  CAD. Hypertension:  Yes. Smoking:  Currently. Previous Surgery:  Left carotid endarterectomy on 02/02/2008. CV History:  Asymptomatic. Amaurosis [place] No, Paresthesias No, Hemiparesis No                                      RIGHT             LEFT Brachial systolic pressure:         184               161 Brachial Doppler waveforms:         WNL               WNL Vertebral direction of flow:        Antegrade         To-fro DUPLEX VELOCITIES (cm/sec) CCA peak systolic                   102               90 ECA peak systolic                   193               [id number] ICA peak systolic                   207               [id number] ICA end diastolic                   56                28-P/70-M PLAQUE MORPHOLOGY:                  Calcified         Heterogeneous PLAQUE AMOUNT:                      Moderate          Mild PLAQUE LOCATION:                    CCA, ICA, ECA     CCA, ICA, ECA  IMPRESSION: 1. Bilateral common carotid artery disease present. 2. Right internal carotid artery stenosis in the 40%-59% range. 3. Left internal carotid artery patent with history of endarterectomy,     vessel tortuosity present with elevated velocities in the mid     segment suggesting stenosis in the 60%-79% range.  However, no     plaque formation is identified. 4. Bilateral external carotid artery stenosis present. 5. Minimal change present since previous study on 04/28/2010.        ___________________________________________ [name]. [name], M.D.  SH/MEDQ  D:  04/27/2011  T:  04/27/2011  Job:  [id number]

## 2011-05-14 ENCOUNTER — Other Ambulatory Visit: Payer: Self-pay | Admitting: Internal Medicine

## 2011-05-19 ENCOUNTER — Ambulatory Visit (INDEPENDENT_AMBULATORY_CARE_PROVIDER_SITE_OTHER): Payer: Medicare Other | Admitting: *Deleted

## 2011-05-19 DIAGNOSIS — I4891 Unspecified atrial fibrillation: Secondary | ICD-10-CM

## 2011-05-24 ENCOUNTER — Encounter: Payer: Self-pay | Admitting: Thoracic Surgery (Cardiothoracic Vascular Surgery)

## 2011-05-28 LAB — URINALYSIS, ROUTINE W REFLEX MICROSCOPIC
Bilirubin Urine: NEGATIVE
Glucose, UA: NEGATIVE
Hgb urine dipstick: NEGATIVE
Ketones, ur: NEGATIVE
Protein, ur: NEGATIVE

## 2011-05-28 LAB — HEMOGLOBIN A1C
Hgb A1c MFr Bld: 5.7
Hgb A1c MFr Bld: 6.6 — ABNORMAL HIGH
Mean Plasma Glucose: 126

## 2011-05-28 LAB — BASIC METABOLIC PANEL
BUN: 11
BUN: 8
CO2: 26
Chloride: 105
Creatinine, Ser: 1.32 — ABNORMAL HIGH
Creatinine, Ser: 1.55 — ABNORMAL HIGH
GFR calc Af Amer: 51 — ABNORMAL LOW
GFR calc non Af Amer: 35 — ABNORMAL LOW
GFR calc non Af Amer: 39 — ABNORMAL LOW
GFR calc non Af Amer: 42 — ABNORMAL LOW
Glucose, Bld: 117 — ABNORMAL HIGH
Glucose, Bld: 94
Potassium: 3.9
Potassium: 4.1
Sodium: 137

## 2011-05-28 LAB — PROTIME-INR: Prothrombin Time: 11.9

## 2011-05-28 LAB — DIFFERENTIAL
Basophils Absolute: 0
Basophils Relative: 0
Eosinophils Absolute: 0.1
Eosinophils Relative: 1

## 2011-05-28 LAB — CBC
HCT: 28.5 — ABNORMAL LOW
Hemoglobin: 9.8 — ABNORMAL LOW
MCV: 86.2
MCV: 86.9
Platelets: 196
RBC: 3.22 — ABNORMAL LOW
RBC: 3.84 — ABNORMAL LOW
RDW: 15.7 — ABNORMAL HIGH
WBC: 12.9 — ABNORMAL HIGH
WBC: 8.9

## 2011-05-28 LAB — LIPID PANEL
Cholesterol: 209 — ABNORMAL HIGH
LDL Cholesterol: 110 — ABNORMAL HIGH

## 2011-05-28 LAB — HEMOGLOBIN AND HEMATOCRIT, BLOOD
HCT: 29.6 — ABNORMAL LOW
Hemoglobin: 10.1 — ABNORMAL LOW

## 2011-05-28 LAB — COMPREHENSIVE METABOLIC PANEL
ALT: 9
AST: 11
CO2: 25
Chloride: 105
Creatinine, Ser: 1.31 — ABNORMAL HIGH
GFR calc Af Amer: 51 — ABNORMAL LOW
GFR calc non Af Amer: 42 — ABNORMAL LOW
Total Bilirubin: 0.6

## 2011-05-28 LAB — ABO/RH: ABO/RH(D): B POS

## 2011-05-28 LAB — TSH: TSH: 2.432

## 2011-05-28 LAB — TYPE AND SCREEN: ABO/RH(D): B POS

## 2011-05-31 ENCOUNTER — Encounter: Payer: Self-pay | Admitting: Thoracic Surgery (Cardiothoracic Vascular Surgery)

## 2011-06-01 ENCOUNTER — Ambulatory Visit: Payer: Medicare Other | Admitting: Internal Medicine

## 2011-06-02 LAB — BASIC METABOLIC PANEL
BUN: 11
Calcium: 8.7
GFR calc non Af Amer: 39 — ABNORMAL LOW
Glucose, Bld: 111 — ABNORMAL HIGH
Potassium: 4

## 2011-06-02 LAB — URINALYSIS, ROUTINE W REFLEX MICROSCOPIC
Bilirubin Urine: NEGATIVE
Ketones, ur: NEGATIVE
Nitrite: NEGATIVE
Urobilinogen, UA: 0.2

## 2011-06-02 LAB — PROTIME-INR
INR: 0.9
Prothrombin Time: 12.2

## 2011-06-02 LAB — CROSSMATCH

## 2011-06-02 LAB — COMPREHENSIVE METABOLIC PANEL
BUN: 13
CO2: 26
Chloride: 103
Creatinine, Ser: 1.33 — ABNORMAL HIGH
GFR calc non Af Amer: 42 — ABNORMAL LOW
Total Bilirubin: 0.6

## 2011-06-02 LAB — CBC
HCT: 27.5 — ABNORMAL LOW
HCT: 32.2 — ABNORMAL LOW
MCV: 89
Platelets: 291
RBC: 3.62 — ABNORMAL LOW
RDW: 14.5
WBC: 14.3 — ABNORMAL HIGH

## 2011-06-02 LAB — APTT: aPTT: 35

## 2011-06-07 ENCOUNTER — Encounter: Payer: Self-pay | Admitting: Internal Medicine

## 2011-06-07 ENCOUNTER — Encounter: Payer: Medicare Other | Admitting: Thoracic Surgery (Cardiothoracic Vascular Surgery)

## 2011-06-14 ENCOUNTER — Encounter: Payer: Medicare Other | Admitting: Thoracic Surgery (Cardiothoracic Vascular Surgery)

## 2011-06-15 LAB — URINE CULTURE: Colony Count: 3000

## 2011-06-15 LAB — URINE MICROSCOPIC-ADD ON

## 2011-06-15 LAB — URINALYSIS, ROUTINE W REFLEX MICROSCOPIC
Bilirubin Urine: NEGATIVE
Ketones, ur: NEGATIVE
Nitrite: POSITIVE — AB
Protein, ur: NEGATIVE
Specific Gravity, Urine: 1.003 — ABNORMAL LOW
Urobilinogen, UA: 0.2

## 2011-06-16 ENCOUNTER — Ambulatory Visit (INDEPENDENT_AMBULATORY_CARE_PROVIDER_SITE_OTHER): Payer: Medicare Other | Admitting: Thoracic Surgery (Cardiothoracic Vascular Surgery)

## 2011-06-16 ENCOUNTER — Encounter: Payer: Medicare Other | Admitting: Thoracic Surgery (Cardiothoracic Vascular Surgery)

## 2011-06-16 ENCOUNTER — Encounter: Payer: Self-pay | Admitting: Thoracic Surgery (Cardiothoracic Vascular Surgery)

## 2011-06-16 ENCOUNTER — Encounter: Payer: Medicare Other | Admitting: *Deleted

## 2011-06-16 VITALS — BP 137/59 | HR 58 | Resp 20 | Ht 65.0 in | Wt 138.0 lb

## 2011-06-16 DIAGNOSIS — Z9889 Other specified postprocedural states: Secondary | ICD-10-CM | POA: Insufficient documentation

## 2011-06-16 DIAGNOSIS — I340 Nonrheumatic mitral (valve) insufficiency: Secondary | ICD-10-CM

## 2011-06-16 DIAGNOSIS — Z8679 Personal history of other diseases of the circulatory system: Secondary | ICD-10-CM | POA: Insufficient documentation

## 2011-06-16 DIAGNOSIS — Z951 Presence of aortocoronary bypass graft: Secondary | ICD-10-CM | POA: Insufficient documentation

## 2011-06-16 DIAGNOSIS — I059 Rheumatic mitral valve disease, unspecified: Secondary | ICD-10-CM

## 2011-06-16 NOTE — Patient Instructions (Signed)
Patient has been instructed to schedule an appointment to Dr. Lawana Chambers for dental extraction. She has also been reminded to schedule an appointment with Dr. Haroldine Laws for routine followup. She's been instructed to stop taking amiodarone. She has no physical restrictions with respect to her surgery at this time. She is been encouraged to try to get and rolled in the cardiac rehabilitation program.

## 2011-06-16 NOTE — Progress Notes (Signed)
PCP is Phineas Inches, MD Referring Provider is Bensimhon, Shaune Pascal, MD  Chief Complaint  Patient presents with  . Routine Post Op    2 month f/u, S/P CBBG x3, mitral valve repair and Maze procedure 03/05/11      HPI:  Patient returns for further routine followup status post coronary artery bypass grafting x3, mitral valve repair, and Maze procedure on 03/05/2011. She was last seen here in our office on 03/29/2011. Since then she has continued to do very well physically. She never did get enrolled in the cardiac rehabilitation program. She has been having her prothrombin time checked and followed through the coumadin clinic. She has not yet been seen in followup by Dr. Haroldine Laws and she has not had a followup echocardiogram since hospital discharge. She has not had any further episodes of tachypalpitations suggestive of atrial fibrillation. She states that she had 1 brief episode during the first week after hospital discharge, but since then her pulse has remained perfectly regular. She reports that her breathing is quite good. She has minimal residual soreness in her chest and overall she is getting along fairly well. She is under a fair amount of stress at home do to family matters. Otherwise she is doing quite well.   Past Medical History  Diagnosis Date  . Coronary artery disease 5/09    s/p CABG 7/12   L-LAD, S-OM, S-PDA  . PAD (peripheral artery disease)     Severe; s/p bilateral renal artery stents, moderate in-stent restenosis  . AAA (abdominal aortic aneurysm) 5/09    3.7x3.3 by u/s 2009  . carotid stenosis 5/09    S/p L CEA ;  60-79% bilat ICA by preCABG dopplers 7/12  . Hypertension   . Hyperlipidemia   . COPD (chronic obstructive pulmonary disease)   . GERD (gastroesophageal reflux disease)     With hiatal hernia  . DM2 (diabetes mellitus, type 2)   . Paroxysmal atrial fibrillation     coumadin;  echo 9/07: EF 60%, mild LVH;  s/p Cox Maze 7/12 with LAA clipping  . GIB  (gastrointestinal bleeding) 9/11    S/p EGD with cautery at [facility]  . Sleep apnea   . Abnormal EKG     deep TW inversions chronic  . CKD (chronic kidney disease)   . Mitral regurgitation     3+ MR by intraoperative TEE;  s/p MV repair with Dr. Roxy Manns 7/12    Past Surgical History  Procedure Date  . Carotid endarterectomy     Left  . Abdominal hysterectomy   . Hand surgery     RIGHT WRIST  . Right femoral-popliteal bypass   . Coronary artery bypass graft 03/05/2011    CABG x3 (LIMA to LAD, SVG to OM, SVG to PDA, EVH via left thigh  . Mitral valve repair 03/05/2011    29mm Memo 3D ring annuloplasty for ischemic MR  . Maze 03/05/2011    complete biatrial lesion set with clipping of LA appendage    Family History  Problem Relation Age of Onset  . Emphysema Mother   . Heart attack Father   . Emphysema Father   . Heart disease Other     Vascular disease in grandparents, uncles and dad    Social History History  Substance Use Topics  . Smoking status: Former Smoker -- 0.5 packs/day for 42 years    Quit date: 02/04/2010  . Smokeless tobacco: Not on file  . Alcohol Use: No    Current  Outpatient Prescriptions  Medication Sig Dispense Refill  . amiodarone (PACERONE) 200 MG tablet TAKE 1 TABLET BY MOUTH DAILY FOR HEART  30 tablet  10  . amLODipine (NORVASC) 10 MG tablet Take 10 mg by mouth daily.        Marland Kitchen aspirin 81 MG tablet Take 81 mg by mouth daily.        Marland Kitchen buPROPion (ZYBAN) 150 MG 12 hr tablet Take 150 mg by mouth 2 (two) times daily.       . carvedilol (COREG) 6.25 MG tablet TAKE 1 TABLET BY MOUTH TWICE DAILY WITH FOOD FOR BLOOD PRESSURE/HEART  60 tablet  10  . citalopram (CELEXA) 10 MG tablet Take 20 mg by mouth daily.       . fenofibrate micronized (LOFIBRA) 134 MG capsule Take 134 mg by mouth at bedtime.        . hydrALAZINE (APRESOLINE) 50 MG tablet Take 1 tablet (50 mg total) by mouth 3 (three) times daily.  90 tablet  3  . hydrocodone-acetaminophen (ZYDONE) 5-400 MG  per tablet Take 1 tablet by mouth every 6 (six) hours as needed.        . NON FORMULARY Oxigen 2 liters, take at bedtime.       Marland Kitchen omeprazole (PRILOSEC) 20 MG capsule Take 20 mg by mouth 2 (two) times daily.        . pravastatin (PRAVACHOL) 80 MG tablet Take 80 mg by mouth at bedtime.        . traZODone (DESYREL) 50 MG tablet Take 50 mg by mouth at bedtime as needed.        . warfarin (COUMADIN) 1 MG tablet 0.5 mg. Take as directed by Anticoagulation clinic          Allergies  Allergen Reactions  . Clonidine Hydrochloride   . Codeine     REACTION: itching    Review of Systems  Constitutional: Negative.   HENT: Negative.   Eyes: Negative.   Respiratory: Negative.  Negative for chest tightness and shortness of breath.   Cardiovascular: Negative.  Negative for chest pain, palpitations and leg swelling.  Gastrointestinal: Negative.   Genitourinary: Negative.   Musculoskeletal: Negative.   Neurological: Negative.   Hematological: Negative.   Psychiatric/Behavioral: Negative.     BP 137/59  Pulse 58  Resp 20  Ht 5\' 5"  (1.651 m)  Wt 138 lb (62.596 kg)  BMI 22.96 kg/m2  SpO2 96% Physical Exam  Vitals reviewed. Constitutional: She is oriented to person, place, and time. She appears well-developed and well-nourished.  HENT:  Head: Normocephalic.  Eyes: Pupils are equal, round, and reactive to light.  Neck: Normal range of motion. No JVD present.  Cardiovascular: Normal rate, regular rhythm and normal heart sounds.   No murmur heard.      Two channel rhythm strip reveals normal sinus rhythm  Pulmonary/Chest: Effort normal and breath sounds normal. She has no rales.  Abdominal: Soft. Bowel sounds are normal. There is no tenderness.  Musculoskeletal: Normal range of motion. She exhibits no edema.  Lymphadenopathy:    She has no cervical adenopathy.  Neurological: She is alert and oriented to person, place, and time.  Skin: Skin is warm and dry.  Psychiatric: She has a normal  mood and affect. Her behavior is normal. Judgment and thought content normal.     Impression:  Clinically the patient seems to be doing very well now approximately 3 months following coronary artery bypass grafting x3, mitral valve repair, and Maze  procedure. She has no chest pain or shortness of breath to be concerned about, and she appears to be maintaining sinus rhythm. She still needs to have her teeth extracted. She has not had a followup echocardiogram since surgery.   Plan:  I've instructed the patient to go ahead and stop taking amiodarone. If she remains in sinus rhythm off amiodarone I think he would be reasonable to consider stopping Coumadin at some point in the near future. I certainly think it would be reasonable to temporarily stop the Coumadin when she needs to have her teeth extracted. At some point it would be reasonable to get a followup echocardiogram to see how her LV function looks and to reevaluate her mitral valve. Overall she's doing quite well peripheral plan to see her back in 3 months for routine followup and rhythm check.

## 2011-06-23 ENCOUNTER — Encounter: Payer: Self-pay | Admitting: Physician Assistant

## 2011-06-23 ENCOUNTER — Ambulatory Visit: Payer: Self-pay | Admitting: Cardiovascular Disease

## 2011-06-23 ENCOUNTER — Ambulatory Visit (INDEPENDENT_AMBULATORY_CARE_PROVIDER_SITE_OTHER): Payer: Medicare Other | Admitting: Physician Assistant

## 2011-06-23 DIAGNOSIS — E785 Hyperlipidemia, unspecified: Secondary | ICD-10-CM

## 2011-06-23 DIAGNOSIS — I4891 Unspecified atrial fibrillation: Secondary | ICD-10-CM

## 2011-06-23 DIAGNOSIS — N189 Chronic kidney disease, unspecified: Secondary | ICD-10-CM

## 2011-06-23 DIAGNOSIS — Z7901 Long term (current) use of anticoagulants: Secondary | ICD-10-CM

## 2011-06-23 DIAGNOSIS — I059 Rheumatic mitral valve disease, unspecified: Secondary | ICD-10-CM

## 2011-06-23 DIAGNOSIS — F17200 Nicotine dependence, unspecified, uncomplicated: Secondary | ICD-10-CM

## 2011-06-23 DIAGNOSIS — I2510 Atherosclerotic heart disease of native coronary artery without angina pectoris: Secondary | ICD-10-CM

## 2011-06-23 NOTE — Assessment & Plan Note (Signed)
Followed by Dr. Lawson. 

## 2011-06-23 NOTE — Assessment & Plan Note (Signed)
Get BMET.  Patient notes she still has not seen nephrology.  Plans to make appt soon.

## 2011-06-23 NOTE — Assessment & Plan Note (Signed)
We discussed the importance of cessation.

## 2011-06-23 NOTE — Assessment & Plan Note (Signed)
Elevated.  She is completing prednisone taper for COPD exacerbation.  Also taking hydralazine 100 mg bid.  Encouraged her to take 50 mg TID.  Could consider increasing coreg.  But would hold off right now with recent COPD exacerbation.  Currently not on an ACE due to CKD.

## 2011-06-23 NOTE — Patient Instructions (Signed)
Your physician recommends that you schedule a follow-up appointment in: [place], PA-C  Your physician recommends that you return for lab work in: [place] BMET 414.01  Your physician has requested that you have an echocardiogram DX 424.0 [place]. Echocardiography is a painless test that uses sound waves to create images of your heart. It provides your doctor with information about the size and shape of your heart and how well your heart's chambers and valves are working. This procedure takes approximately one hour. There are no restrictions for this procedure.   You have been referred to DR. Enrique Sack, [place] FOR 424.0 VALVE DISORDER  Your physician has recommended you make the following change in your medication: STOP COUMADIN; HOWEVER CONTINUE ON THE ASPIRIN   .

## 2011-06-23 NOTE — Assessment & Plan Note (Signed)
She had LAA clipping and MAZE procedure.  She is maintaining NSR.  Discussed with Dr. Burt Knack.  She can stop coumadin.  Remain on ASA 81 mg QD.

## 2011-06-23 NOTE — Progress Notes (Signed)
History of Present Illness: Primary Cardiologist:  Dr. Glori Bickers PCP:  Dr. Bernerd Limbo  [name] is a 60 y.o. female with severe PVD followed by Dr. [name], COPD, HTN, CAD with borderline LAD lesion previously manged medically, PAF, CKD, prior GI bleed and markedly abnormal ECG with persistent severe T wave inversions.  Admitted to [facility] 6/12 with NSTEMI in setting of COPD exacerbation and diastolic CHF.  Echo with normal LVF with concentric LVH.  MI treated medically with her CKD. Myoview scan done here for risk stratification and was markedly abnormal with ongoing chest pain, diffuse ST changes and large areas of ischemia.  She was admitted directly from the office to [facility] and cardiac catheterization demonstrated 90% mid to dLM stenosis, a subtotally occluded pCFX, 90% LAD stenosis after the Dx, then 80%.  Proximal RCA 80%, 50%, dRCA 70%, pPDA 50-70%.  Echocardiogram 6/28: EF 50-55% with mild mitral regurgitation.    Underwent CABG (L-LAD, S-OM, S-PDA), mitral valve repair due to 3+ MR and Cox Maze procedure with left atrial appendage clipping 6/29 with Dr. Roxy Manns.  I saw her in follow up 7/26.  She was stable at that time and was to see Dr. Haroldine Laws in 6-8 weeks.  She has not seen him yet.  Had car trouble and missed her appointment.    Saw Dr. Roxy Manns 10/10.  Amiodarone was stopped.  He noted that she still need dental extractions and asked her to follow up with Dr. Lawana Chambers.  There is also a question of whether or not she can stop coumadin after her MAZE procedure and LAA clipping.   Of note, coumadin started in early 2011 after episode of PAFib.    Doing ok.  Had recent COPD exacerbation.  Completing prednisone taper now.  Breathing better.  Still smoking.  No chest pain. No orthopnea, PND or edema.  No palpitations.  No syncope.  Unable to do cardiac rehab.  Still has not seen nephrology.     Past Medical History  Diagnosis Date  . Coronary artery disease 5/09    s/p CABG 7/12    L-LAD, S-OM, S-PDA  . PAD (peripheral artery disease)     Severe; s/p bilateral renal artery stents, moderate in-stent restenosis  . AAA (abdominal aortic aneurysm) 5/09    3.7x3.3 by u/s 2009  . carotid stenosis 5/09    S/p L CEA ;  60-79% bilat ICA by preCABG dopplers 7/12  . Hypertension   . Hyperlipidemia   . COPD (chronic obstructive pulmonary disease)   . GERD (gastroesophageal reflux disease)     With hiatal hernia  . DM2 (diabetes mellitus, type 2)   . Paroxysmal atrial fibrillation     coumadin;  echo 9/07: EF 60%, mild LVH;  s/p Cox Maze 7/12 with LAA clipping  . GIB (gastrointestinal bleeding) 9/11    S/p EGD with cautery at [facility]  . Sleep apnea   . Abnormal EKG     deep TW inversions chronic  . CKD (chronic kidney disease)   . Mitral regurgitation     3+ MR by intraoperative TEE;  s/p MV repair with Dr. Roxy Manns 7/12    Current Outpatient Prescriptions  Medication Sig Dispense Refill  . albuterol (PROVENTIL HFA;VENTOLIN HFA) 108 (90 BASE) MCG/ACT inhaler Inhale 2 puffs into the lungs every 4 (four) hours as needed.        Marland Kitchen amLODipine (NORVASC) 10 MG tablet Take 10 mg by mouth daily.        Marland Kitchen  aspirin 81 MG tablet Take 81 mg by mouth daily.        Marland Kitchen buPROPion (ZYBAN) 150 MG 12 hr tablet Take 150 mg by mouth 2 (two) times daily.       . carvedilol (COREG) 6.25 MG tablet TAKE 1 TABLET BY MOUTH TWICE DAILY WITH FOOD FOR BLOOD PRESSURE/HEART  60 tablet  10  . citalopram (CELEXA) 10 MG tablet Take 20 mg by mouth daily.       . fenofibrate micronized (LOFIBRA) 134 MG capsule Take 134 mg by mouth at bedtime.        . hydrALAZINE (APRESOLINE) 50 MG tablet Take 1 tablet (50 mg total) by mouth 3 (three) times daily.  90 tablet  3  . hydrocodone-acetaminophen (ZYDONE) 5-400 MG per tablet Take 1 tablet by mouth every 6 (six) hours as needed.        . NON FORMULARY Oxigen 2 liters, take at bedtime.       Marland Kitchen omeprazole (PRILOSEC) 20 MG capsule Take 20 mg by mouth 2 (two) times daily.          . pravastatin (PRAVACHOL) 80 MG tablet Take 80 mg by mouth at bedtime.        . predniSONE (STERAPRED UNI-PAK) 10 MG tablet Take 10 mg by mouth as directed.        . traZODone (DESYREL) 50 MG tablet Take 50 mg by mouth at bedtime as needed.        . warfarin (COUMADIN) 1 MG tablet 0.5 mg. Take as directed by Anticoagulation clinic          Allergies: Allergies  Allergen Reactions  . Clonidine Hydrochloride   . Codeine     REACTION: itching    Social Hx:  Still smoking.    Vital Signs: BP 158/71  Pulse 80  Resp 18  Ht 5\' 5"  (1.651 m)  Wt 140 lb (63.504 kg)  BMI 23.30 kg/m2  PHYSICAL EXAM: Well nourished, well developed, in no acute distress HEENT: normal Neck: no JVD, + left CEA scar Cardiac:  normal S1, S2; RRR; 1/6 systolic murmur along LSB Lungs:  clear to auscultation bilaterally, no wheezing, rhonchi or rales Abd: soft, nontender, no hepatomegaly Ext: no edema Skin: warm and dry Neuro:  CNs 2-12 intact, no focal abnormalities noted  EKG:   Sinus rhythm, heart rate 69, normal axis, T wave inversions in 1, aVL, V4-V6, incomplete right bundle branch block, no changes  ASSESSMENT AND PLAN:

## 2011-06-23 NOTE — Assessment & Plan Note (Signed)
S/p MV repair.  Get follow up echo.

## 2011-06-23 NOTE — Assessment & Plan Note (Signed)
Managed by PCP

## 2011-06-23 NOTE — Assessment & Plan Note (Signed)
Stable.  No angina.  Continue ASA and statin.  Follow up with Dr. Burt Knack in the future. Will arrange follow up in 4 months.

## 2011-06-24 LAB — BASIC METABOLIC PANEL
Chloride: 106 mEq/L (ref 96–112)
GFR: 35.16 mL/min — ABNORMAL LOW (ref >60.00)
Glucose, Bld: 134 mg/dL — ABNORMAL HIGH (ref 70–99)
Potassium: 3.8 mEq/L (ref 3.5–5.1)
Sodium: 141 mEq/L (ref 135–145)

## 2011-06-29 ENCOUNTER — Other Ambulatory Visit (HOSPITAL_COMMUNITY): Payer: Medicare Other | Admitting: Radiology

## 2011-06-30 ENCOUNTER — Other Ambulatory Visit (HOSPITAL_COMMUNITY): Payer: Self-pay | Admitting: Dentistry

## 2011-06-30 DIAGNOSIS — K045 Chronic apical periodontitis: Secondary | ICD-10-CM

## 2011-06-30 DIAGNOSIS — K083 Retained dental root: Secondary | ICD-10-CM

## 2011-06-30 DIAGNOSIS — K0530 Chronic periodontitis, unspecified: Secondary | ICD-10-CM

## 2011-06-30 DIAGNOSIS — K0401 Reversible pulpitis: Secondary | ICD-10-CM

## 2011-07-01 ENCOUNTER — Other Ambulatory Visit (HOSPITAL_COMMUNITY): Payer: Self-pay | Admitting: Dentistry

## 2011-07-01 ENCOUNTER — Encounter (HOSPITAL_COMMUNITY)
Admission: RE | Admit: 2011-07-01 | Discharge: 2011-07-01 | Disposition: A | Discharge status: Home / Self Care | Payer: Medicare Other | Source: Ambulatory Visit | Attending: Dentistry | Admitting: Dentistry

## 2011-07-01 DIAGNOSIS — Z01811 Encounter for preprocedural respiratory examination: Secondary | ICD-10-CM

## 2011-07-01 LAB — BASIC METABOLIC PANEL
Chloride: 101 mEq/L (ref 96–112)
Creatinine, Ser: 1.84 mg/dL — ABNORMAL HIGH (ref 0.50–1.10)
GFR calc Af Amer: 34 mL/min — ABNORMAL LOW (ref >90)
GFR calc non Af Amer: 29 mL/min — ABNORMAL LOW (ref >90)

## 2011-07-01 LAB — CBC
HCT: 32.8 % — ABNORMAL LOW (ref 36.0–46.0)
Hemoglobin: 10.5 g/dL — ABNORMAL LOW (ref 12.0–15.0)
MCHC: 32 g/dL (ref 30.0–36.0)

## 2011-07-05 ENCOUNTER — Ambulatory Visit (HOSPITAL_COMMUNITY)
Admission: RE | Admit: 2011-07-05 | Discharge: 2011-07-05 | Disposition: A | Discharge status: Home / Self Care | Payer: Medicare Other | Source: Ambulatory Visit | Attending: Dentistry | Admitting: Dentistry

## 2011-07-05 DIAGNOSIS — K083 Retained dental root: Secondary | ICD-10-CM

## 2011-07-05 DIAGNOSIS — Z01812 Encounter for preprocedural laboratory examination: Secondary | ICD-10-CM | POA: Insufficient documentation

## 2011-07-05 DIAGNOSIS — J449 Chronic obstructive pulmonary disease, unspecified: Secondary | ICD-10-CM | POA: Insufficient documentation

## 2011-07-05 DIAGNOSIS — J4489 Other specified chronic obstructive pulmonary disease: Secondary | ICD-10-CM | POA: Insufficient documentation

## 2011-07-05 DIAGNOSIS — E119 Type 2 diabetes mellitus without complications: Secondary | ICD-10-CM | POA: Insufficient documentation

## 2011-07-05 DIAGNOSIS — I059 Rheumatic mitral valve disease, unspecified: Secondary | ICD-10-CM | POA: Insufficient documentation

## 2011-07-05 DIAGNOSIS — K045 Chronic apical periodontitis: Secondary | ICD-10-CM

## 2011-07-05 DIAGNOSIS — Z01818 Encounter for other preprocedural examination: Secondary | ICD-10-CM | POA: Insufficient documentation

## 2011-07-05 DIAGNOSIS — I2510 Atherosclerotic heart disease of native coronary artery without angina pectoris: Secondary | ICD-10-CM | POA: Insufficient documentation

## 2011-07-05 DIAGNOSIS — K0530 Chronic periodontitis, unspecified: Secondary | ICD-10-CM

## 2011-07-05 DIAGNOSIS — I10 Essential (primary) hypertension: Secondary | ICD-10-CM | POA: Insufficient documentation

## 2011-07-05 NOTE — Op Note (Signed)
[name], [name]                 ACCOUNT NO.:  [id number]  MEDICAL RECORD NO.:  [id number]  LOCATION:  [place]                         FACILITY:  [facility]  PHYSICIAN:  Teena Dunk, D.D.S.DATE OF BIRTH:  07/12/1951  DATE OF PROCEDURE:  07/05/2011 DATE OF DISCHARGE:                              OPERATIVE REPORT   PREOPERATIVE DIAGNOSES: 1. History of mitral valve disease. 2. History of coronary artery disease. 3. Status post mitral valve repair. 4. Status post coronary bypass graft procedure. 5. Chronic periodontitis. 6. Apical periodontitis. 7. Multiple retained root segments.  POSTOPERATIVE DIAGNOSES: 1. History of mitral valve disease. 2. History of coronary artery disease. 3. Status post mitral valve repair. 4. Status post coronary bypass graft procedure. 5. Chronic periodontitis. 6. Apical periodontitis. 7. Multiple retained root segments.  OPERATIONS: 1. Extraction of remaining teeth (tooth #8, 9, 10, 11, 12, 19, 21, 22,     23, 24, 25, 26, 27, 28, and 30). 2. Four quadrants of alveoloplasty.  SURGEONS:  Teena Dunk, DDS.  ASSISTANT:  Dora Silverthorne (general assistant)  ANESTHESIA:  General anesthesia via nasoendotracheal tube.  MEDICATIONS: 1. Ancef 1-2 g IV prior to invasive dental procedures per protocol. 2. Local anesthesia with a total utilization of 3 carpules each     containing 34 mg lidocaine with 0.017 mg of epinephrine as well as     2 carpules each containing 9 mg of bupivacaine with 0.009 mg of     epinephrine.  SPECIMENS:  There were 15 teeth that were discarded.  COMPLICATIONS:  None.  ESTIMATED BLOOD LOSS:  100 mL.  FLUIDS:  150 mL of normal saline solution and 600 mL lactated Ringer solution.  INDICATIONS:  The patient had a history of mitral valve disease and coronary artery disease.  The patient subsequently underwent a coronary bypass graft procedure along with the mitral valve repair with Dr. Roxy Manns.  Dental consultation was  then requested to rule out dental infection that may affect the patient's systemic health and previous heart surgery.  The patient was examined and treatment planned for extraction of all remaining teeth with alveoloplasty pre prosthetic surgery as indicated.  This treatment plan was formulated to decrease the risks for complications associated with dental infection from affecting the patient's stomach health and previous heart surgery.  OPERATIVE FINDINGS:  The patient was examined in operating room #4.  The teeth were identified for extraction.  The patient noted TO be affected by chronic periodontitis, apical periodontitis, multiple retained root segments.  DESCRIPTION OF PROCEDURE:  The patient was brought to the main operating room #4.  The patient was then placed in the supine position on the operating room table.  General anesthesia was then induced via nasoendotracheal tube.  The patient then prepped and draped in usual manner for dental medicine procedure.  A time-out was performed.  The patient was identified and procedures were verified.  Throat pack was placed at this time.  The oral cavity was then thoroughly examined with the findings noted above.  The patient was then ready for the dental medicine procedure as follows.  Local anesthesia was administered sequentially with a total utilization of 3 carpules each containing 34 mg  of lidocaine with 0.017 mg of epinephrine as well as 2 carpules each containing 9 mg of bupivacaine with 0.009 mg of epinephrine.  The maxillary left and right quadrants were first approached.  The patient was anesthetized utilizing the lidocaine with epinephrine via infiltration method.  At this point in time, tooth #8-12 were approached.  A 15 blade incision was made from the mesial #6 and extended to the distal #14.  A surgical flap was then carefully reflected.  Appropriate amounts of buccal and interseptal bone were removed with a surgical  handpiece and bur and copious amounts of steriel water. The retained roots were then elevated with a series of elevators.  The teeth were then removed with the 150 forceps to include tooth #8,9,10,11, and 12.  Alveoloplasty was then performed utilizing rongeurs and bone file.  Surgical site was then irrigated with copious amounts of sterile saline.  Tissues were then approximated and trimmed appropriately.  Surgical site was then closed with distal #14, extended to the mesial of #9, utilizing 3-0 chromic gut suture in a continuous interrupted suture technique x1.  Maxillary right surgical site was then closed from the distal #6 and extended the mesial #8 utilizing 3-0 chromic gut suture in a continuous erupted suture technique x1.  At this point in time, the mandibular quadrants were approached.  The patient was given bilateral inferior alveolar nerve blocks utilizing the bupivacaine with epinephrine.  Bilateral long buccal nerve blocks were also given with the bupivacaine with epinephrine.  Further infiltration was achieved utilizing the lidocaine with epinephrine.  At this point in time, a 15 blade incision was made from the distal of #18 extended to the distal #31.  A surgical flap was then carefully reflected.  Appropriate amounts of buccal and interseptal bone were removed with a surgical handpiece and bur and copious amounts of sterile saline.  At this point in time, the teeth were subluxated with a series of straight elevators.  The coronal aspect of tooth #30 then removed with a 23 forceps leaving the roots remaining.  Further bone was then removed around the retained roots and the roots were then elevated out with a series of Cryer elevators without complication.  Tooth #28,27,26,25,24,23,22 and 21 were then removed with the 151 forceps without complications.  At this point in time, the remaining coronal aspect of tooth #19 was removed with a 17 forceps leaving the  roots remaining.  Further bone was then moved around the retained roots, as indicated and the roots were elevated out with a series of Cryer elevators without complications.  Alveoplasty was then performed utilizing rongeurs and bone file involving the mandibular right and mandibular left quadrants.  Tissues were approximated and trimmed appropriately.  The surgical sites were then irrigated with copious amounts sterile saline x4.  The mandibular left surgical site was then closed from distal #18, extended the mesial #24 utilizing 3-0 chromic gut suture in a continuous interrupted suture technique x1.  The mandibular right surgical site was then closed from distal #31, extended the mesial #27 utilizing 3-0 chromic gut suture in a continuous interrupted suture technique x1.  Three individual interrupted sutures were then placed to further close the surgical site from 27-25 appropriately.  At this point in time, the entire mouth was irrigated with copious amounts of sterile saline.  The patient was examined for complications, seeing none, dental medicine procedure deemed to be complete.  The throat pack was removed at this time.  A series of 4 x  4 gauze were placed in the mouth to aid hemostasis.  An oral airway was then placed at the request of the anesthesia team.  The patient was then handed over to the anesthesia team for final disposition.  After appropriate amount of time, the patient was extubated and taken to the [facility] with stable vital signs, good oxygenation level.  All counts were correct for the dental medicine procedure.  The patient was seen approximately 7-10 days for evaluation for suture removal.  The patient is to continue her amoxicillin antibiotic therapy until completed per previous prescription.  The patient is to use Percocet 5/325 taking 1-2 tablets every 4-6 hours as needed for pain.          ______________________________ Teena Dunk,  D.D.S.     RK/MEDQ  D:  07/05/2011  T:  07/05/2011  Job:  [id number]  cc:   Valentina Gu. Roxy Manns, M.D. Shaune Pascal. Bensimhon, MD Juanda Bond. Burt Knack, MD  Electronically Signed by Teena Dunk D.D.S. on 07/05/2011 03:44:21 PM

## 2011-07-06 ENCOUNTER — Other Ambulatory Visit (HOSPITAL_COMMUNITY): Payer: Medicare Other | Admitting: Radiology

## 2011-07-15 ENCOUNTER — Ambulatory Visit (HOSPITAL_COMMUNITY): Payer: Medicaid - Dental | Admitting: Dentistry

## 2011-07-15 VITALS — BP 128/53 | HR 59 | Temp 98.2°F

## 2011-07-15 DIAGNOSIS — K08109 Complete loss of teeth, unspecified cause, unspecified class: Secondary | ICD-10-CM

## 2011-07-15 NOTE — Progress Notes (Signed)
   BP: 128/53            P: 73            T: 98.2  [name] is a 60 year old female that is S/P CABG with MV repair surgery with Dr. Roxy Manns. Patient had all remaining teeth extracted with alveoloplasty on 07/05/2011. Patient now presents for evaluation of healing and suture removal as indicated. Subjective: Patient denies significant dental pain but does have some tenderness. Patient has "no stitches" remaining.  Exam: There is no sign of infection, heme, or ooze. Patient has generalized primary closure. There are no sutures remaining.  Assessment: Postop course is consistent with dental procedures in the operating room. Patient is now edentulous   Plan/recommendations:  1. Patient to continue saltwater rinses as indicated to aid healing. 2. We discussed fabrication of upper and lower complete dentures with a primary dentist of her choice. Patient wishes to proceed with dental medicine to fabricate her new dentures. A quote was provided. Patient to return to clinic for fabrication of upper lower complete dentures in approximately one month. Patient is aware of the extensor procedures involved and fabricating in the dentures. 3. Return to clinic in 1 month or call if problems arise before then. Dr. Teena Dunk

## 2011-07-27 ENCOUNTER — Other Ambulatory Visit: Payer: Self-pay | Admitting: Internal Medicine

## 2011-08-09 ENCOUNTER — Ambulatory Visit (HOSPITAL_COMMUNITY): Payer: Medicaid - Dental | Admitting: Dentistry

## 2011-08-09 VITALS — BP 151/72 | HR 61 | Temp 97.4°F

## 2011-08-09 DIAGNOSIS — K08109 Complete loss of teeth, unspecified cause, unspecified class: Secondary | ICD-10-CM

## 2011-08-09 NOTE — Progress Notes (Signed)
Monday-August 09, 2011  BP:151/72              P:  61            T:97.4    [name] presents for start of upper and lower complete denture fabrication. Exam: Pt.is edentulous.  Discussed procedures involved in upper and lower complete denture fabrication and prognosis for successful ability to wear dentures. Price for dentures confirmed. Pt. agrees to proceed with upper and lower complete denture fabrication. Procedure: Upper and lower complete denture primary impressions in alginate. Lab pour. To [facility] for custom tray fabrication. RTC for final impressions. Dr. Enrique Sack

## 2011-08-10 ENCOUNTER — Ambulatory Visit (HOSPITAL_COMMUNITY): Payer: Medicare Other | Attending: Cardiology | Admitting: Radiology

## 2011-08-10 DIAGNOSIS — I10 Essential (primary) hypertension: Secondary | ICD-10-CM | POA: Insufficient documentation

## 2011-08-10 DIAGNOSIS — E785 Hyperlipidemia, unspecified: Secondary | ICD-10-CM | POA: Insufficient documentation

## 2011-08-10 DIAGNOSIS — F17200 Nicotine dependence, unspecified, uncomplicated: Secondary | ICD-10-CM | POA: Insufficient documentation

## 2011-08-10 DIAGNOSIS — I2510 Atherosclerotic heart disease of native coronary artery without angina pectoris: Secondary | ICD-10-CM

## 2011-08-10 DIAGNOSIS — I059 Rheumatic mitral valve disease, unspecified: Secondary | ICD-10-CM | POA: Insufficient documentation

## 2011-08-18 ENCOUNTER — Encounter (HOSPITAL_COMMUNITY): Payer: Self-pay | Admitting: Dentistry

## 2011-09-07 HISTORY — PX: BOWEL RESECTION: SHX1257

## 2011-09-20 ENCOUNTER — Ambulatory Visit: Payer: Medicare Other | Admitting: Thoracic Surgery (Cardiothoracic Vascular Surgery)

## 2011-09-27 ENCOUNTER — Ambulatory Visit (HOSPITAL_COMMUNITY): Payer: Self-pay | Admitting: Dentistry

## 2011-09-27 VITALS — BP 175/75 | HR 68 | Temp 97.5°F

## 2011-09-27 DIAGNOSIS — Z463 Encounter for fitting and adjustment of dental prosthetic device: Secondary | ICD-10-CM

## 2011-09-27 DIAGNOSIS — K08109 Complete loss of teeth, unspecified cause, unspecified class: Secondary | ICD-10-CM

## 2011-09-27 NOTE — Progress Notes (Signed)
Monday, September 27, 2011   BP: 178/75             P:68          T: 97.5   [name] presents for continued upper and lower denture fabrication.  Procedure: Upper and lower complete denture border molding and final impressions in Aquasil. Patient tolerated procedure well. To [facility] for custom baseplates with rims. RTC for upper and lower complete denture jaw relations.  Dr. Teena Dunk

## 2011-09-30 ENCOUNTER — Ambulatory Visit: Payer: Self-pay | Admitting: Cardiovascular Disease

## 2011-10-05 ENCOUNTER — Ambulatory Visit (HOSPITAL_COMMUNITY): Payer: Self-pay | Admitting: Dentistry

## 2011-10-05 VITALS — BP 161/82 | HR 81 | Temp 97.7°F

## 2011-10-05 DIAGNOSIS — Z463 Encounter for fitting and adjustment of dental prosthetic device: Secondary | ICD-10-CM

## 2011-10-05 DIAGNOSIS — K08109 Complete loss of teeth, unspecified cause, unspecified class: Secondary | ICD-10-CM

## 2011-10-05 NOTE — Progress Notes (Signed)
Tuesday, October 05, 2011   BP:  161/82         P: 81                  T:  97.7  [name] presents for continued upper and lower complete denture fabrication. Patient questioned about elevated blood pressure. Patient indicates that she did take her blood pressure medication and that she "felt fine". Procedure: Upper and lower complete denture Jaw relations with aluwax bite registration. Patient  agrees to tooth selection of 21D, F, 10 degree posteriors to match with Portrait A2 shade.  Patient tolerated procedure well. Return to clinic for upper and lower complete denture wax tryin. Patient dismissed in stable condition. Dr. Teena Dunk

## 2011-10-11 ENCOUNTER — Ambulatory Visit: Payer: Medicare Other | Admitting: Thoracic Surgery (Cardiothoracic Vascular Surgery)

## 2011-10-14 ENCOUNTER — Ambulatory Visit (HOSPITAL_COMMUNITY): Payer: Self-pay | Admitting: Dentistry

## 2011-10-14 VITALS — BP 143/76 | HR 61 | Temp 99.1°F

## 2011-10-14 DIAGNOSIS — K08109 Complete loss of teeth, unspecified cause, unspecified class: Secondary | ICD-10-CM

## 2011-10-14 DIAGNOSIS — M264 Malocclusion, unspecified: Secondary | ICD-10-CM

## 2011-10-14 NOTE — Progress Notes (Signed)
Thursday, October 14, 2011   BP: 143/76             P: 61          T: 99.1   [name] presents for continued upper and lower complete denture fabrication. Procedure: Upper and lower complete denture wax tryin. Maxillary set up is acceptable and patient agrees. Lower set up with mandibular anteriors set too far off the ridge facially. Will need to reset 22-27 while dropping 21 and 28 from setup. Patient is aware of reason for new setup and accepts fact that setup will now be more Class II and that there may be a slight diastema between premolars and canines on lower. Patient to return to clinic for upper and lower complete denture wax tryin #2. Dr. Teena Dunk

## 2011-10-26 ENCOUNTER — Ambulatory Visit (INDEPENDENT_AMBULATORY_CARE_PROVIDER_SITE_OTHER): Payer: Medicare Other | Admitting: Cardiovascular Disease

## 2011-10-26 ENCOUNTER — Ambulatory Visit (HOSPITAL_COMMUNITY): Payer: Self-pay | Admitting: Dentistry

## 2011-10-26 ENCOUNTER — Encounter: Payer: Self-pay | Admitting: Cardiovascular Disease

## 2011-10-26 VITALS — BP 182/74 | HR 66 | Temp 98.1°F

## 2011-10-26 DIAGNOSIS — M264 Malocclusion, unspecified: Secondary | ICD-10-CM

## 2011-10-26 DIAGNOSIS — I4891 Unspecified atrial fibrillation: Secondary | ICD-10-CM

## 2011-10-26 DIAGNOSIS — K08109 Complete loss of teeth, unspecified cause, unspecified class: Secondary | ICD-10-CM

## 2011-10-26 DIAGNOSIS — I714 Abdominal aortic aneurysm, without rupture: Secondary | ICD-10-CM

## 2011-10-26 DIAGNOSIS — I2510 Atherosclerotic heart disease of native coronary artery without angina pectoris: Secondary | ICD-10-CM

## 2011-10-26 DIAGNOSIS — I10 Essential (primary) hypertension: Secondary | ICD-10-CM

## 2011-10-26 DIAGNOSIS — I059 Rheumatic mitral valve disease, unspecified: Secondary | ICD-10-CM

## 2011-10-26 MED ORDER — CARVEDILOL 12.5 MG PO TABS: 12.5000 mg | ORAL_TABLET | Freq: Two times a day (BID) | ORAL | Status: DC

## 2011-10-26 NOTE — Assessment & Plan Note (Signed)
Followed by Dr. Lawson. 

## 2011-10-26 NOTE — Assessment & Plan Note (Signed)
The patient continues to maintain sinus rhythm several months out from her Cryo-Cox Maze procedure. She will continue on aspirin.

## 2011-10-26 NOTE — Assessment & Plan Note (Signed)
The patient's blood pressure remains elevated. She reports difficulty in controlling this. Her medications were reviewed and I advised her to increase carvedilol to 12.5 mg twice daily. If she remains above goal, her hydralazine could be increased to 100 mg 3 times daily.

## 2011-10-26 NOTE — Progress Notes (Signed)
Tuesday, October 26, 2011   BP:182/74                  P:  72               T:98.1  Lunah Aroyo presents for continued upper and lower complete denture fabrication. Procedure: Upper and lower complete denture wax tryin 2. Tooth #22 -27 were reset to be more over the ridge and 221/28 premolars were removed consistent with Class II relationship. Patient accepts esthetics, phonetics, fit and function. Patient agrees to process "as is" in [place]. Pt. to return to clinic for upper and lower complete denture insertion.  Dr. Teena Dunk

## 2011-10-26 NOTE — Assessment & Plan Note (Signed)
The patient is stable without anginal symptoms following multivessel coronary bypass last year. She is tolerating her current medical program and is on aspirin and a statin drug. She will continue with her same medications. I would like to see her back in 6 months.

## 2011-10-26 NOTE — Patient Instructions (Signed)
Your physician has recommended you make the following change in your medication: INCREASE Carvedilol to 12.5mg  take one by mouth twice a day  Your physician wants you to follow-up in: 6 MONTHS. You will receive a reminder letter in the mail two months in advance. If you don't receive a letter, please call our office to schedule the follow-up appointment.

## 2011-10-26 NOTE — Assessment & Plan Note (Signed)
No significant residual mitral regurgitation after mitral valve repair last year.

## 2011-10-26 NOTE — Progress Notes (Signed)
HPI:  61 year old woman presenting for followup evaluation. She has previously been followed by Dr. Haroldine Laws. The patient has multiple medical problems with extensive peripheral arterial disease followed by Dr. [name]. She has COPD, hypertension, coronary artery disease, paroxysmal atrial fibrillation, and history of GI bleed. In June 2012 she presented with non-ST elevation MI and underwent catheterization showing severe three-vessel coronary artery disease including left main disease. She also had severe mitral regurgitation she underwent coronary bypass grafting and mitral valve repair during that hospital admission. She also had a CryoCox-Maze procedure with left atrial appendage clipping.  followup postoperative echocardiogram in December 2012 showed normal LV size and systolic function. The patient's mitral annuloplasty ring was well seated with mild mitral stenosis and trivial regurgitation. Pulmonary artery pressure was normal. There was mild left atrial dilatation.  The patient feels that she is doing fairly well from a cardiac standpoint. She denies dyspnea, chest pain, orthopnea, PND, or edema. She's having problems with weakness and generalized fatigue. She is anemic and is taking iron replacement. She has been followed closely by Dr. Coletta Memos and has been referred to a gastroenterologist in [facility] for further evaluation.  The patient has bilateral calf claudication symptoms. These are stable. She has chronic kidney disease and renal artery stenosis. All for vascular issues are followed by Dr. [name]. She has upcoming surveillance ultrasound and followup visit with him this summer.  Outpatient Encounter Prescriptions as of 10/26/2011  Medication Sig Dispense Refill  . amLODipine (NORVASC) 10 MG tablet Take 10 mg by mouth daily.        Marland Kitchen aspirin 325 MG tablet Take 325 mg by mouth daily.      Marland Kitchen buPROPion (ZYBAN) 150 MG 12 hr tablet Take 150 mg by mouth 2 (two) times daily.       . carvedilol  (COREG) 6.25 MG tablet TAKE 1 TABLET BY MOUTH TWICE DAILY WITH FOOD FOR BLOOD PRESSURE/HEART  60 tablet  10  . citalopram (CELEXA) 10 MG tablet Take 20 mg by mouth daily.       . fenofibrate micronized (LOFIBRA) 134 MG capsule Take 134 mg by mouth at bedtime.        . ferrous gluconate (FERGON) 325 MG tablet Take 325 mg by mouth daily with breakfast.      . hydrALAZINE (APRESOLINE) 50 MG tablet Take 1 tablet (50 mg total) by mouth 3 (three) times daily.  90 tablet  3  . hydrocodone-acetaminophen (ZYDONE) 5-400 MG per tablet Take 1 tablet by mouth every 6 (six) hours as needed.       . NON FORMULARY Oxigen 2 liters, take at bedtime.       Marland Kitchen omeprazole (PRILOSEC) 20 MG capsule Take 20 mg by mouth 2 (two) times daily.        . pravastatin (PRAVACHOL) 80 MG tablet TAKE 1 TABLET BY MOUTH DAILY  30 tablet  5  . traZODone (DESYREL) 50 MG tablet Take 50 mg by mouth at bedtime as needed.        . zoster vaccine live, PF, (ZOSTAVAX) 60454 UNT/0.65ML injection Inject 0.65 mLs into the skin once.      Marland Kitchen DISCONTD: albuterol (PROVENTIL HFA;VENTOLIN HFA) 108 (90 BASE) MCG/ACT inhaler Inhale 2 puffs into the lungs every 4 (four) hours as needed.        Marland Kitchen DISCONTD: aspirin 81 MG tablet Take 81 mg by mouth daily.          Allergies  Allergen Reactions  . Clonidine Hydrochloride   .  Codeine     REACTION: itching    Past Medical History  Diagnosis Date  . Coronary artery disease 5/09    s/p CABG 7/12   L-LAD, S-OM, S-PDA  . PAD (peripheral artery disease)     Severe; s/p bilateral renal artery stents, moderate in-stent restenosis  . AAA (abdominal aortic aneurysm) 5/09    3.7x3.3 by u/s 2009  . carotid stenosis 5/09    S/p L CEA ;  60-79% bilat ICA by preCABG dopplers 7/12  . Hypertension   . Hyperlipidemia   . COPD (chronic obstructive pulmonary disease)   . GERD (gastroesophageal reflux disease)     With hiatal hernia  . DM2 (diabetes mellitus, type 2)   . Paroxysmal atrial fibrillation      coumadin;  echo 9/07: EF 60%, mild LVH;  s/p Cox Maze 7/12 with LAA clipping  . GIB (gastrointestinal bleeding) 9/11    S/p EGD with cautery at [facility]  . Sleep apnea   . Abnormal EKG     deep TW inversions chronic  . CKD (chronic kidney disease)   . Mitral regurgitation     3+ MR by intraoperative TEE;  s/p MV repair with Dr. Roxy Manns 7/12    ROS: Negative except as per HPI  BP 168/72  Pulse 63  Ht 5\' 5"  (1.651 m)  Wt 61.236 kg (135 lb)  BMI 22.47 kg/m2  PHYSICAL EXAM: Pt is alert and oriented, NAD HEENT: normal Neck: JVP - normal, carotids 2+= with Bilateral carotid bruits Lungs: CTA bilaterally CV: RRR without murmur or gallop Abd: soft, NT, Positive BS, no hepatomegaly Ext: no C/C/E. The right femoral pulses 2+, the left femoral pulse is trace. Pedal pulses are nonpalpable. Skin: warm/dry no rash  EKG:  Normal sinus rhythm 63 beats per minute, incomplete right bundle branch block.  ASSESSMENT AND PLAN:

## 2011-11-04 ENCOUNTER — Ambulatory Visit (HOSPITAL_COMMUNITY): Payer: Self-pay | Admitting: Dentistry

## 2011-11-04 VITALS — BP 169/69 | HR 59 | Temp 97.2°F

## 2011-11-04 DIAGNOSIS — M264 Malocclusion, unspecified: Secondary | ICD-10-CM

## 2011-11-04 DIAGNOSIS — K08109 Complete loss of teeth, unspecified cause, unspecified class: Secondary | ICD-10-CM

## 2011-11-04 DIAGNOSIS — Z463 Encounter for fitting and adjustment of dental prosthetic device: Secondary | ICD-10-CM

## 2011-11-04 NOTE — Patient Instructions (Signed)
Instructions for Denture Use and Care  Congratulations, you are on the way to oral rehabilitation!  You have just received a new set of complete or partial dentures.  These prostheses will help to improve both your appearance and chewing ability.  These instructions will help you get adjusted to your dentures as well as care for them properly.  Please read these instructions carefully and completely as soon as you get home.  If you or your caregiver have any questions please notify the [facility] at [phone].  HOW YOUR DENTURES LOOK AND FEEL Soon after you begin wearing your dentures, you may feel that your dentures are too large or even loose.  As our mouth and facial muscles become accustomed to the dentures, these feelings will go away.  You also may feel that you are salivating more than you normally do.  This feeling should go away as you get used to having the dentures in your mouth.  You may bite your cheek or your tongue; this will eventually resolve itself as you wear your dentures.  Some soreness is to be expected, but you should not hurt.  If your mouth hurts, call your dentist.  A denture adhesive may occasionally be necessary to hold your dentures in place more securely.  The dentist will let you know when one is recommended for you.  SPEAKING Wearing dentures will change the sound of your voice initially.  This will be noticed by you more than anyone else.  Bite and swallow before you speak, in order to place your dentures in position so that you may speak more clearly.  Practice speaking by reading aloud or counting from 1 to 100 very slowly and distinctly.  After some practice your mouth will become accustomed to your dentures and you will speak more clearly.  EATING Chewing will definitely be different after you receive your dentures.  With a little practice and patience you should be able to eat just about any kind of food.  Begin by eating small quantities of food  that are cut into small pieces.  Star with soft foods such as eggs, cooked vegetables, or puddings.  As you gain confidence advance  Your diet to whatever texture foods you can tolerate.  DENTURE CARE Dentures can collect plaque and calculus much the same as natural teeth can.  If not removed on a regular basis, your dentures will not look or feel clean, and you will experience denture odor.  It is very important that you remove your dentures at bedtime and clean them thoroughly.  You should: 1. Clean your dentures over a sink full of water so if dropped, breakage will be prevented. 2. Rinse your dentures with cool water to remove any large food particles. 3. Use soap and water or a denture cleanser or paste to clean the dentures.  Do not use regular toothpaste as it may abrade the denture base or teeth. 4. Use a moistened denture brush to clean all surfaces (inside and outside). 5. Rinse thoroughly to remove any remaining soap or denture cleanser. 6. Use a soft bristle toothbrush to gently brush any natural teeth, gums, tongue, and palate at bedtime and before reinserting your dentures. 7. Do not sleep with your dentures in your mouth at night.  Remove your dentures and soak them overnight in a denture cup filled with water or denture solution as recommended by your dentist.  This routine will become second nature and will increase the life and comfort   of your dentures.  Please do not try to adjust these dentures yourself; you could damage them.  FOLLOW-UP You should call or make an appointment with your dentist.  Your dentist would like to see you at least once a year for a check-up and examination. 

## 2011-11-04 NOTE — Progress Notes (Signed)
Thursday, November 04, 2011   BP: 169/69          P:  61            T: 97.2  [name] presents for insertion of upper and lower complete dentures. Procedure: Pressure indicating paste applied to dentures. Adjustments made as needed. [place] Occlusion evaluated and adjustments made as needed for centric relation and protrusive strokes. Good esthetics, phonetics, fit, and function noted. Patient accepts results. Postop instructions were provided in a written and verbal format on the use and care of the upper and lower complete dentures . Gave patient denture brush and cup. Patient  to keep dentures out if sore spots develop. Use salt water rinses as needed to aid healing. RTC as scheduled for denture adjustment. Call if problems arise before then. Patient dismissed in stable condition. Dr. Teena Dunk

## 2011-11-08 ENCOUNTER — Ambulatory Visit: Payer: Medicare Other | Admitting: Thoracic Surgery (Cardiothoracic Vascular Surgery)

## 2011-11-09 ENCOUNTER — Ambulatory Visit (HOSPITAL_COMMUNITY): Payer: Self-pay | Admitting: Dentistry

## 2011-11-09 VITALS — BP 145/58 | HR 62 | Temp 98.4°F

## 2011-11-09 DIAGNOSIS — K1370 Unspecified lesions of oral mucosa: Secondary | ICD-10-CM

## 2011-11-09 NOTE — Progress Notes (Signed)
Tuesday, November 09, 2011   BP: 145/58            P: 62             T: 98.4  [name] presents for adjustment of recently inserted upper and lower complete dentures. Subjective: Patient is complaining of some irritation to the mandibular anterior areas.  Exam:  There is no evidence of ulceration. There is slight erythema to the mandibular anterior area. Procedure: Pressure indicating paste applied to upper and lower complete dentures. Adjustments made as needed. [place] Occlusion evaluated and adjustments made as needed for centric relation and protrusive strokes. Patient accepts results. Patient to keep dentures out if sore spots develop. Use salt water rinses as needed to aid healing. RTC as scheduled for denture adjustment. Call if problems arise before then. Pt. dismissed in stable condition. Dr. Enrique Sack

## 2011-11-15 ENCOUNTER — Ambulatory Visit: Payer: Medicare Other | Admitting: Thoracic Surgery (Cardiothoracic Vascular Surgery)

## 2011-12-01 ENCOUNTER — Ambulatory Visit (HOSPITAL_COMMUNITY): Payer: Self-pay | Admitting: Dentistry

## 2011-12-01 VITALS — BP 160/73 | HR 62 | Temp 98.9°F

## 2011-12-01 DIAGNOSIS — Z463 Encounter for fitting and adjustment of dental prosthetic device: Secondary | ICD-10-CM

## 2011-12-01 DIAGNOSIS — K1370 Unspecified lesions of oral mucosa: Secondary | ICD-10-CM

## 2011-12-01 DIAGNOSIS — K062 Gingival and edentulous alveolar ridge lesions associated with trauma: Secondary | ICD-10-CM

## 2011-12-01 NOTE — Progress Notes (Signed)
Wednesday, December 01, 2011   BP: 160/73                P:  62            T: 98.9  [name] presents for adjustment of recently inserted upper and lower complete dentures. Subjective: Patient is complaining of some irritation to the mandibular arch.  Exam:  There is no evidence of ulceration. There is slight erythema to the mandibular anterior area and lower right lingual areas. Procedure: Pressure indicating paste applied to upper and lower complete dentures. Adjustments made as needed. [place] Occlusion evaluated and adjustments made as needed for centric relation and protrusive strokes. Patient accepts results. Patient to keep dentures out if sore spots develop. Use salt water rinses as needed to aid healing. RTC as scheduled for denture adjustment. Call if problems arise before then. Pt. dismissed in stable condition. Dr. Teena Dunk

## 2011-12-13 ENCOUNTER — Ambulatory Visit: Payer: Medicare Other | Admitting: Thoracic Surgery (Cardiothoracic Vascular Surgery)

## 2012-01-03 ENCOUNTER — Ambulatory Visit: Payer: Medicare Other | Admitting: Thoracic Surgery (Cardiothoracic Vascular Surgery)

## 2012-01-12 ENCOUNTER — Ambulatory Visit (HOSPITAL_COMMUNITY): Payer: Self-pay | Admitting: Dentistry

## 2012-01-12 VITALS — BP 160/73 | HR 60 | Temp 97.1°F

## 2012-01-12 DIAGNOSIS — K1370 Unspecified lesions of oral mucosa: Secondary | ICD-10-CM

## 2012-01-12 DIAGNOSIS — Z463 Encounter for fitting and adjustment of dental prosthetic device: Secondary | ICD-10-CM

## 2012-01-12 NOTE — Progress Notes (Signed)
Wednesday, Jan 12, 2012  BP: 160/73      P: 60         T:  97.1  [name] presents for evaluation of upper and lower complete dentures. Dentures were inserted on 11/04/2011.  Subjective: Patient is not complaining of any irritation from the dentures . Patient is concerned the aesthetics of the upper denture however.  Exam:  There is no evidence of denture irritation, ulceration, or erythema. The dentures are very stable and retentive. The dentures hace acceptable occlusion in both centric relation and protrusive strokes. The aesthetics of the upper complete denture is acceptable with good midline and lip contour. I do not apreciate any excessive fullness of the upper lip.  Procedure: Pressure indicating paste applied to upper and lower complete dentures. Adjustments made as needed. [place] Occlusion evaluated and adjustments made as needed for centric relation and protrusive strokes. Patient accepts results of the adjustment.  Patient is still concerned about the maxillary denture aesthetics. I discussed the positioning of the maxillary anterior teeth and feel that they are in a very acceptable position. We discussed the pre-extraction condition of the maxillary teeth and a very worn dentition type of look. Patient is still not able to get over how she looks with the upper complete denture and the presence of a full set of teeth. I do not feel that moving of the maxillary anterior teeth would be acceptable at this time.  Patient did understand that patient did agree to process as it is on the 10/26/2011 wax try in appointment. Patient expressed understanding and will consider keeping the dentures "as is" for now. Patient to keep dentures out if sore spots develop. Use salt water rinses as needed to aid healing. RTC as scheduled for denture adjustment. Call if problems arise before then. Pt. dismissed in stable condition. Dr. Enrique Sack

## 2012-01-24 ENCOUNTER — Ambulatory Visit: Payer: Medicare Other | Admitting: Thoracic Surgery (Cardiothoracic Vascular Surgery)

## 2012-02-21 ENCOUNTER — Other Ambulatory Visit: Payer: Self-pay | Admitting: Internal Medicine

## 2012-03-14 ENCOUNTER — Ambulatory Visit: Payer: Self-pay | Admitting: Physician Assistant

## 2012-05-01 ENCOUNTER — Encounter: Payer: Self-pay | Admitting: Vascular Surgery

## 2012-05-02 ENCOUNTER — Ambulatory Visit: Payer: Medicare Other | Admitting: Vascular Surgery

## 2012-05-02 ENCOUNTER — Other Ambulatory Visit: Payer: Medicare Other

## 2012-05-18 ENCOUNTER — Ambulatory Visit: Payer: Self-pay | Admitting: Cardiovascular Disease

## 2012-05-29 ENCOUNTER — Encounter (HOSPITAL_COMMUNITY): Payer: Self-pay | Admitting: Dentistry

## 2012-05-29 ENCOUNTER — Ambulatory Visit (HOSPITAL_COMMUNITY): Payer: Self-pay | Admitting: Dentistry

## 2012-05-29 VITALS — BP 148/74 | HR 58 | Temp 98.6°F

## 2012-05-29 DIAGNOSIS — K08109 Complete loss of teeth, unspecified cause, unspecified class: Secondary | ICD-10-CM

## 2012-05-29 DIAGNOSIS — K062 Gingival and edentulous alveolar ridge lesions associated with trauma: Secondary | ICD-10-CM

## 2012-05-29 DIAGNOSIS — K1370 Unspecified lesions of oral mucosa: Secondary | ICD-10-CM

## 2012-05-29 DIAGNOSIS — Z463 Encounter for fitting and adjustment of dental prosthetic device: Secondary | ICD-10-CM

## 2012-05-29 NOTE — Patient Instructions (Addendum)
Return for denture adjustment as needed.  Instructions for Denture Use and Care  Congratulations, you are on the way to oral rehabilitation!  You have just received a new set of complete or partial dentures.  These prostheses will help to improve both your appearance and chewing ability.  These instructions will help you get adjusted to your dentures as well as care for them properly.  Please read these instructions carefully and completely as soon as you get home.  If you or your caregiver have any questions please notify the [facility] at [phone].  HOW YOUR DENTURES LOOK AND FEEL Soon after you begin wearing your dentures, you may feel that your dentures are too large or even loose.  As our mouth and facial muscles become accustomed to the dentures, these feelings will go away.  You also may feel that you are salivating more than you normally do.  This feeling should go away as you get used to having the dentures in your mouth.  You may bite your cheek or your tongue; this will eventually resolve itself as you wear your dentures.  Some soreness is to be expected, but you should not hurt.  If your mouth hurts, call your dentist.  A denture adhesive may occasionally be necessary to hold your dentures in place more securely.  The dentist will let you know when one is recommended for you.  SPEAKING Wearing dentures will change the sound of your voice initially.  This will be noticed by you more than anyone else.  Bite and swallow before you speak, in order to place your dentures in position so that you may speak more clearly.  Practice speaking by reading aloud or counting from 1 to 100 very slowly and distinctly.  After some practice your mouth will become accustomed to your dentures and you will speak more clearly.  EATING Chewing will definitely be different after you receive your dentures.  With a little practice and patience you should be able to eat just about any kind of food.   Begin by eating small quantities of food that are cut into small pieces.  Star with soft foods such as eggs, cooked vegetables, or puddings.  As you gain confidence advance  Your diet to whatever texture foods you can tolerate.  DENTURE CARE Dentures can collect plaque and calculus much the same as natural teeth can.  If not removed on a regular basis, your dentures will not look or feel clean, and you will experience denture odor.  It is very important that you remove your dentures at bedtime and clean them thoroughly.  You should: 1. Clean your dentures over a sink full of water so if dropped, breakage will be prevented. 2. Rinse your dentures with cool water to remove any large food particles. 3. Use soap and water or a denture cleanser or paste to clean the dentures.  Do not use regular toothpaste as it may abrade the denture base or teeth. 4. Use a moistened denture brush to clean all surfaces (inside and outside). 5. Rinse thoroughly to remove any remaining soap or denture cleanser. 6. Use a soft bristle toothbrush to gently brush any natural teeth, gums, tongue, and palate at bedtime and before reinserting your dentures. 7. Do not sleep with your dentures in your mouth at night.  Remove your dentures and soak them overnight in a denture cup filled with water or denture solution as recommended by your dentist.  This routine will become second nature  and will increase the life and comfort of your dentures.  Please do not try to adjust these dentures yourself; you could damage them.  FOLLOW-UP You should call or make an appointment with your dentist.  Your dentist would like to see you at least once a year for a check-up and examination.

## 2012-05-29 NOTE — Progress Notes (Signed)
05/29/2012   Patient:            [name] Date of Birth:  [birth year] MRN:                [id number]  BP 148/74  Pulse 58  Temp 98.6 F (37 C)  Past Medical History  Diagnosis Date  . Coronary artery disease 5/09    s/p CABG 7/12   L-LAD, S-OM, S-PDA  . PAD (peripheral artery disease)     Severe; s/p bilateral renal artery stents, moderate in-stent restenosis  . AAA (abdominal aortic aneurysm) 5/09    3.7x3.3 by u/s 2009  . carotid stenosis 5/09    S/p L CEA ;  60-79% bilat ICA by preCABG dopplers 7/12  . Hypertension   . Hyperlipidemia   . COPD (chronic obstructive pulmonary disease)   . GERD (gastroesophageal reflux disease)     With hiatal hernia  . DM2 (diabetes mellitus, type 2)   . Paroxysmal atrial fibrillation     coumadin;  echo 9/07: EF 60%, mild LVH;  s/p Cox Maze 7/12 with LAA clipping  . GIB (gastrointestinal bleeding) 9/11    S/p EGD with cautery at [facility]  . Sleep apnea   . Abnormal EKG     deep TW inversions chronic  . CKD (chronic kidney disease)   . Mitral regurgitation     3+ MR by intraoperative TEE;  s/p MV repair with Dr. Roxy Manns 7/12   Allergies  Allergen Reactions  . Clonidine Hydrochloride     History of nervousness   . Codeine     REACTION: itching  . Contrast Media (Iodinated Diagnostic Agents) Itching    Itching of feet   Current Outpatient Prescriptions  Medication Sig Dispense Refill  . amLODipine (NORVASC) 10 MG tablet Take 10 mg by mouth daily.        Marland Kitchen aspirin 325 MG tablet Take 325 mg by mouth daily.      Marland Kitchen buPROPion (ZYBAN) 150 MG 12 hr tablet Take 150 mg by mouth 2 (two) times daily.       . carvedilol (COREG) 12.5 MG tablet Take 1 tablet (12.5 mg total) by mouth 2 (two) times daily with a meal.  60 tablet  11  . citalopram (CELEXA) 10 MG tablet Take 20 mg by mouth daily.       . fenofibrate micronized (LOFIBRA) 134 MG capsule Take 134 mg by mouth at bedtime.        . ferrous gluconate (FERGON) 325 MG tablet Take 325 mg by mouth  daily with breakfast.      . hydrocodone-acetaminophen (ZYDONE) 5-400 MG per tablet Take 1 tablet by mouth every 6 (six) hours as needed.       . NON FORMULARY Oxigen 2 liters, take at bedtime.       Marland Kitchen omeprazole (PRILOSEC) 20 MG capsule Take 20 mg by mouth 2 (two) times daily.        . pravastatin (PRAVACHOL) 80 MG tablet TAKE 1 TABLET BY MOUTH DAILY  30 tablet  5  . traZODone (DESYREL) 50 MG tablet Take 50 mg by mouth at bedtime as needed.        . hydrALAZINE (APRESOLINE) 50 MG tablet Take 1 tablet (50 mg total) by mouth 3 (three) times daily.  90 tablet  3  . zoster vaccine live, PF, (ZOSTAVAX) 96295 UNT/0.65ML injection Inject 0.65 mLs into the skin once.       [name] presents for  periodic oral examination and evaluation of denture irritation. Upper and lower complete dentures were inserted on 11/04/2011. Patient now presents for evaluation of denture irritation over the past 3-4 weeks. Patient is complaining of irritation to the lower left buccal vestibule and lower right buccal vestibule the posterior molar area. Patient is also complaining of some late afternoon gagging from the lower denture. Patient is able to eat everything including peanuts and steak. Patient denied any problems with the aesthetics of the dentures today.  Exam:  General: Patient is a well-developed, well-nourished female in no acute distress Extraoral exam: There is no palpable lymphadenopathy. The patient denies acute TMJ symptoms. Intraoral exam: The patient has normal saliva. Patient is edentulous. There is slight erythema to the lower left and lower right buccal vestibule areas of the posterior region. No denture ulcers are noted. Dentition: Patient is edentulous. Prosthodontics: The dentures are stable and retentive. Pressure indicating paste applied to upper and lower complete dentures. Adjustments made as needed. [place] Occlusion evaluated and adjustments made as needed for centric relation and  protrusive strokes. Patient accepts results of the adjustment. I am not sure why the patient experiences midafternoon gagging.  This may be related to gastrointestinal problems or occurred be up related to possible loss of retention. Patient was instructed to add additional denture adhesive at that time of the day to help increase the retention stability of the dentures if this is a possible cause of the gagging.  Patient agrees to try additional adhesive at that time and will report back at the next visit in 2-3 weeks. Patient to keep dentures out if sore spots remain or other ones arise. Use salt water rinses as needed to aid healing. RTC as scheduled for denture adjustment. Call if problems arise before then. Pt. dismissed in stable condition. Dr. Enrique Sack

## 2012-06-16 ENCOUNTER — Ambulatory Visit: Payer: Self-pay | Admitting: Cardiovascular Disease

## 2012-06-19 DIAGNOSIS — K08109 Complete loss of teeth, unspecified cause, unspecified class: Secondary | ICD-10-CM

## 2012-06-20 ENCOUNTER — Encounter (HOSPITAL_COMMUNITY): Payer: Self-pay | Admitting: Dentistry

## 2012-06-20 ENCOUNTER — Ambulatory Visit (HOSPITAL_COMMUNITY): Payer: Self-pay | Admitting: Dentistry

## 2012-06-20 VITALS — BP 138/62 | HR 50 | Temp 98.3°F

## 2012-06-20 DIAGNOSIS — Z463 Encounter for fitting and adjustment of dental prosthetic device: Secondary | ICD-10-CM

## 2012-06-20 DIAGNOSIS — K08109 Complete loss of teeth, unspecified cause, unspecified class: Secondary | ICD-10-CM

## 2012-06-20 NOTE — Patient Instructions (Signed)
Call as needed otherwise return to clinic as scheduled.  Dr. Enrique Sack

## 2012-06-20 NOTE — Progress Notes (Signed)
06/20/2012   Patient:            [name] Date of Birth:  1950-12-08 MRN:                [id number]  BP 138/62  Pulse 50  Temp 98.3 F (36.8 C) (Oral)  [name] presents for  periodic oral examination and evaluation of denture irritation. Upper and lower complete dentures were inserted on 11/04/2011. Patient seen for followup evaluation on 05/26/2012.  Patient had several areas adjusted at that time. Patient denies having any denture problems now.  Patient indicates that she can eat just about anything that she wants. Patient denies having any problems with gagging that she noted last visit. Exam:  No sign of denture irritation or erythema. Dental procedures: Pressure indicating paste applied to upper and lower complete dentures. Adjustments made as needed. [place] Occlusion evaluated and and no adjustments were required. Patient accepts results of the adjustment. Patient to keep dentures out if sore spots develop. Use salt water rinses as needed to aid healing. RTC as scheduled for denture adjustment. Call if problems arise before then. Pt. dismissed in stable condition. Dr. Enrique Sack

## 2012-08-29 ENCOUNTER — Other Ambulatory Visit: Payer: Self-pay | Admitting: *Deleted

## 2012-08-29 DIAGNOSIS — I739 Peripheral vascular disease, unspecified: Secondary | ICD-10-CM

## 2012-08-29 DIAGNOSIS — Z48812 Encounter for surgical aftercare following surgery on the circulatory system: Secondary | ICD-10-CM

## 2012-08-29 DIAGNOSIS — I6529 Occlusion and stenosis of unspecified carotid artery: Secondary | ICD-10-CM

## 2012-08-29 DIAGNOSIS — I714 Abdominal aortic aneurysm, without rupture: Secondary | ICD-10-CM

## 2012-09-05 ENCOUNTER — Ambulatory Visit: Payer: Self-pay | Admitting: Vascular Surgery

## 2012-09-27 ENCOUNTER — Encounter: Payer: Self-pay | Admitting: Neurosurgery

## 2012-09-28 ENCOUNTER — Encounter (INDEPENDENT_AMBULATORY_CARE_PROVIDER_SITE_OTHER): Payer: Medicare Other | Admitting: *Deleted

## 2012-09-28 ENCOUNTER — Other Ambulatory Visit (INDEPENDENT_AMBULATORY_CARE_PROVIDER_SITE_OTHER): Payer: Medicare Other | Admitting: *Deleted

## 2012-09-28 ENCOUNTER — Telehealth: Payer: Self-pay | Admitting: Cardiovascular Disease

## 2012-09-28 ENCOUNTER — Ambulatory Visit (INDEPENDENT_AMBULATORY_CARE_PROVIDER_SITE_OTHER): Payer: Medicare Other | Admitting: Neurosurgery

## 2012-09-28 ENCOUNTER — Encounter: Payer: Self-pay | Admitting: Neurosurgery

## 2012-09-28 VITALS — BP 126/61 | HR 51 | Resp 16 | Ht 65.0 in | Wt 137.0 lb

## 2012-09-28 DIAGNOSIS — Z48812 Encounter for surgical aftercare following surgery on the circulatory system: Secondary | ICD-10-CM

## 2012-09-28 DIAGNOSIS — I714 Abdominal aortic aneurysm, without rupture, unspecified: Secondary | ICD-10-CM

## 2012-09-28 DIAGNOSIS — I739 Peripheral vascular disease, unspecified: Secondary | ICD-10-CM

## 2012-09-28 DIAGNOSIS — I6529 Occlusion and stenosis of unspecified carotid artery: Secondary | ICD-10-CM

## 2012-09-28 NOTE — Progress Notes (Signed)
[facility] PAD/AAACarotid Office Note  CC: PAD, AAA, carotid surveillance Referring Physician: [name]  History of Present Illness: 62 year old female patient of Dr. [name] followed for known AAA as well as a history of a left carotid CEA in 2009 and a right femoropopliteal with known occlusion from 1994. The patient also has bilateral renal artery stents placed in 2004. The patient denies true claudication or rest pain. The patient has no signs or symptoms of CVA, TIA, amaurosis fugax or any neural deficit. The patient denies any abdominal or back pain. The patient states her primary care physician does want her renal artery stents duplexed however she states she does go to her cardiologist in the next month or so and believes that she wants that done there. I explained to the patient we have we would be happy to do that here and she understands and will call our office if she wants this completed in our office vs. the cardiologist.  Past Medical History  Diagnosis Date  . Coronary artery disease 5/09    s/p CABG 7/12   L-LAD, S-OM, S-PDA  . PAD (peripheral artery disease)     Severe; s/p bilateral renal artery stents, moderate in-stent restenosis  . AAA (abdominal aortic aneurysm) 5/09    3.7x3.3 by u/s 2009  . carotid stenosis 5/09    S/p L CEA ;  60-79% bilat ICA by preCABG dopplers 7/12  . Hypertension   . Hyperlipidemia   . COPD (chronic obstructive pulmonary disease)   . GERD (gastroesophageal reflux disease)     With hiatal hernia  . DM2 (diabetes mellitus, type 2)   . Paroxysmal atrial fibrillation     coumadin;  echo 9/07: EF 60%, mild LVH;  s/p Cox Maze 7/12 with LAA clipping  . GIB (gastrointestinal bleeding) 9/11    S/p EGD with cautery at [facility]  . Sleep apnea   . Abnormal EKG     deep TW inversions chronic  . CKD (chronic kidney disease)   . Mitral regurgitation     3+ MR by intraoperative TEE;  s/p MV repair with Dr. Roxy Manns 7/12    ROS: [x]   Positive   [ ]  Denies    General: [ ]  Weight loss, [ ]  Fever, [ ]  chills Neurologic: [ ]  Dizziness, [ ]  Blackouts, [ ]  Seizure [ ]  Stroke, [ ]  "Mini stroke", [ ]  Slurred speech, [ ]  Temporary blindness; [ ]  weakness in arms or legs, [ ]  Hoarseness Cardiac: [ ]  Chest pain/pressure, [ ]  Shortness of breath at rest [ ]  Shortness of breath with exertion, [x ] Atrial fibrillation or irregular heartbeat Vascular: [x ] Pain in legs with walking, [ ]  Pain in legs at rest, [ ]  Pain in legs at night,  [ ]  Non-healing ulcer, [ ]  Blood clot in vein/DVT,   Pulmonary: [ ]  Home oxygen, [ ]  Productive cough, [ ]  Coughing up blood, [ ]  Asthma,  [x ] Wheezing Musculoskeletal:  [ ]  Arthritis, [ ]  Low back pain, [ ]  Joint pain Hematologic: [ ]  Easy Bruising, [ ]  Anemia; [ ]  Hepatitis Gastrointestinal: [ ]  Blood in stool, [ ]  Gastroesophageal Reflux/heartburn, [ ]  Trouble swallowing Urinary: [ ]  chronic Kidney disease, [ ]  on HD - [ ]  MWF or [ ]  TTHS, [ ]  Burning with urination, [ ]  Difficulty urinating Skin: [ ]  Rashes, [ ]  Wounds Psychological: [ ]  Anxiety, [ ]  Depression   Social History History  Substance Use Topics  .  Smoking status: Current Every Day Smoker -- 0.5 packs/day for 42 years    Last Attempt to Quit: 02/04/2010  . Smokeless tobacco: Never Used  . Alcohol Use: Yes     Comment: Ocassionally    Family History Family History  Problem Relation Age of Onset  . Emphysema Mother   . Heart attack Father   . Emphysema Father   . Heart disease Other     Vascular disease in grandparents, uncles and dad    Allergies  Allergen Reactions  . Clonidine Hydrochloride     History of nervousness   . Codeine     REACTION: itching  . Contrast Media (Iodinated Diagnostic Agents) Itching    Itching of feet    Current Outpatient Prescriptions  Medication Sig Dispense Refill  . amLODipine (NORVASC) 10 MG tablet Take 10 mg by mouth daily.        Marland Kitchen aspirin 325 MG tablet Take 325 mg by mouth daily.       . carvedilol (COREG) 12.5 MG tablet Take 1 tablet (12.5 mg total) by mouth 2 (two) times daily with a meal.  60 tablet  11  . cloNIDine (CATAPRES) 0.1 MG tablet Take 0.1 mg by mouth 2 (two) times daily.      . fenofibrate micronized (LOFIBRA) 134 MG capsule Take 134 mg by mouth at bedtime.        . ferrous gluconate (FERGON) 325 MG tablet Take 325 mg by mouth daily with breakfast.      . hydrALAZINE (APRESOLINE) 50 MG tablet Take 50 mg by mouth 3 (three) times daily.      . hydrocodone-acetaminophen (ZYDONE) 5-400 MG per tablet Take 1 tablet by mouth every 6 (six) hours as needed.       . Multiple Vitamin (MULTIVITAMIN) tablet Take 1 tablet by mouth daily.      . NON FORMULARY Oxigen 2 liters, take at bedtime.       Marland Kitchen omeprazole (PRILOSEC) 20 MG capsule Take 20 mg by mouth 2 (two) times daily.        . pravastatin (PRAVACHOL) 80 MG tablet TAKE 1 TABLET BY MOUTH DAILY  30 tablet  5  . traZODone (DESYREL) 50 MG tablet Take 50 mg by mouth at bedtime as needed.        . zoster vaccine live, PF, (ZOSTAVAX) 16109 UNT/0.65ML injection Inject 0.65 mLs into the skin once.      Marland Kitchen buPROPion (ZYBAN) 150 MG 12 hr tablet Take 150 mg by mouth 2 (two) times daily.       . citalopram (CELEXA) 10 MG tablet Take 20 mg by mouth daily.         Physical Examination  Filed Vitals:   09/28/12 1043  BP: 126/61  Pulse: 51  Resp:     Body mass index is 22.80 kg/(m^2).  General:  WDWN in NAD Gait: Normal HEENT: WNL Eyes: Pupils equal Pulmonary: normal non-labored breathing , without Rales, rhonchi,  wheezing Cardiac: RRR, without  Murmurs, rubs or gallops; Abdomen: soft, NT, no masses Skin: no rashes, ulcers noted  Vascular Exam Pulses: 2+ radial pulses bilaterally, palpable femoral pulses bilaterally, there is no lower extremity pulses palpated on the left Carotid bruits: Carotid bruit heard on the left with a dampened pulse on the right Extremities without ischemic changes, no Gangrene , no  cellulitis; no open wounds;  Musculoskeletal: no muscle wasting or atrophy   Neurologic: A&O X 3; Appropriate Affect ; SENSATION: normal; MOTOR FUNCTION:  moving all extremities equally. Speech is fluent/normal  Non-Invasive Vascular Imaging CAROTID DUPLEX 09/28/2012  Right ICA 40 - 59 % stenosis Left ICA 0 - 19% stenosis, velocities suggest tortuosity in the 40-59% range with no plaque formation visualized AAA duplex shows a maximum diameter of 3.8 which is slightly diminished from previous exam in August 2001 when she was 4.0 ABIs today are 0.80 and biphasic on the right, she is 0.55 and monophasic on the left which is consistent with previous exam  ASSESSMENT/PLAN: This is a patient with multiple vascular issues however she has no complaints today. She will followup in one year with repeat studies of the AAA carotids and repeat ABIs. She will call our office if she decides she wants her renal arteries duplex here. The patient's questions were encouraged and answered, she is in agreement with this plan.  Beatris Ship ANP   Clinic MD: Scot Dock on call

## 2012-09-28 NOTE — Telephone Encounter (Signed)
Will forward to dr/nurse for review and advice.

## 2012-09-28 NOTE — Telephone Encounter (Signed)
Noted, pt will be called by scheduling/ melissa T.  to set f/u app.

## 2012-09-28 NOTE — Telephone Encounter (Signed)
Pt calling to set up her February recall , pcp wants her to have [place] of aorta, can get order for that

## 2012-09-28 NOTE — Telephone Encounter (Signed)
Will forward to dr/nurse

## 2012-09-28 NOTE — Telephone Encounter (Signed)
Pt said what she wanted was a renal artery [place] due to her stents, can she have this done??

## 2012-09-28 NOTE — Telephone Encounter (Signed)
Looks like it's already set up with [facility]

## 2012-09-29 NOTE — Telephone Encounter (Signed)
We can do a renal doppler or can be done at [facility] since all other vascular issues are followed there. Whatever the patient prefers is fine.

## 2012-09-29 NOTE — Addendum Note (Signed)
Addended by: Mena Goes on: 09/29/2012 12:09 PM   Modules accepted: Orders

## 2012-10-09 ENCOUNTER — Other Ambulatory Visit: Payer: Self-pay | Admitting: Internal Medicine

## 2012-10-13 NOTE — Telephone Encounter (Signed)
Left message for pt to call back  °

## 2012-11-02 ENCOUNTER — Other Ambulatory Visit: Payer: Self-pay | Admitting: Cardiovascular Disease

## 2012-11-14 ENCOUNTER — Other Ambulatory Visit: Payer: Self-pay | Admitting: Internal Medicine

## 2012-11-14 NOTE — Telephone Encounter (Signed)
This pt is now followed by Dr Burt Knack

## 2012-11-14 NOTE — Telephone Encounter (Signed)
The pt has not called back at this time. I will close this encounter.

## 2012-11-29 ENCOUNTER — Encounter (HOSPITAL_COMMUNITY): Payer: Self-pay | Admitting: Dentistry

## 2012-11-29 ENCOUNTER — Ambulatory Visit (HOSPITAL_COMMUNITY): Payer: Self-pay | Admitting: Dentistry

## 2012-11-29 VITALS — BP 186/68 | HR 51 | Temp 98.3°F

## 2012-11-29 DIAGNOSIS — Z954 Presence of other heart-valve replacement: Secondary | ICD-10-CM

## 2012-11-29 DIAGNOSIS — Z463 Encounter for fitting and adjustment of dental prosthetic device: Secondary | ICD-10-CM

## 2012-11-29 DIAGNOSIS — K08109 Complete loss of teeth, unspecified cause, unspecified class: Secondary | ICD-10-CM

## 2012-11-29 DIAGNOSIS — I2510 Atherosclerotic heart disease of native coronary artery without angina pectoris: Secondary | ICD-10-CM

## 2012-11-29 DIAGNOSIS — Z951 Presence of aortocoronary bypass graft: Secondary | ICD-10-CM

## 2012-11-29 NOTE — Progress Notes (Signed)
11/29/2012   Patient:            [name] Date of Birth:  07-24-1951 MRN:                [id number]  BP 186/68  Pulse 51  Temp(Src) 98.3 F (36.8 C) (Oral)  Past Medical History  Diagnosis Date  . Coronary artery disease 5/09    s/p CABG 7/12   L-LAD, S-OM, S-PDA  . PAD (peripheral artery disease)     Severe; s/p bilateral renal artery stents, moderate in-stent restenosis  . AAA (abdominal aortic aneurysm) 5/09    3.7x3.3 by u/s 2009  . carotid stenosis 5/09    S/p L CEA ;  60-79% bilat ICA by preCABG dopplers 7/12  . Hypertension   . Hyperlipidemia   . COPD (chronic obstructive pulmonary disease)   . GERD (gastroesophageal reflux disease)     With hiatal hernia  . DM2 (diabetes mellitus, type 2)   . Paroxysmal atrial fibrillation     coumadin;  echo 9/07: EF 60%, mild LVH;  s/p Cox Maze 7/12 with LAA clipping  . GIB (gastrointestinal bleeding) 9/11    S/p EGD with cautery at [facility]  . Sleep apnea   . Abnormal EKG     deep TW inversions chronic  . CKD (chronic kidney disease)   . Mitral regurgitation     3+ MR by intraoperative TEE;  s/p MV repair with Dr. Roxy Manns 7/12   Allergies  Allergen Reactions  . Clonidine Hydrochloride     History of nervousness   . Codeine     REACTION: itching  . Contrast Media (Iodinated Diagnostic Agents) Itching    Itching of feet   Current Outpatient Prescriptions  Medication Sig Dispense Refill  . amLODipine (NORVASC) 10 MG tablet Take 10 mg by mouth daily.        Marland Kitchen aspirin 325 MG tablet Take 325 mg by mouth daily.      Marland Kitchen buPROPion (ZYBAN) 150 MG 12 hr tablet Take 150 mg by mouth 2 (two) times daily.       . carvedilol (COREG) 12.5 MG tablet TAKE 1 TABLET BY MOUTH TWICE DAILY WITH A MEAL  60 tablet  1  . citalopram (CELEXA) 10 MG tablet Take 20 mg by mouth daily.       . cloNIDine (CATAPRES) 0.1 MG tablet Take 0.1 mg by mouth 2 (two) times daily.      . fenofibrate micronized (LOFIBRA) 134 MG capsule Take 134 mg by mouth at bedtime.         . ferrous gluconate (FERGON) 325 MG tablet Take 325 mg by mouth daily with breakfast.      . hydrALAZINE (APRESOLINE) 50 MG tablet Take 50 mg by mouth 3 (three) times daily.      . hydrocodone-acetaminophen (ZYDONE) 5-400 MG per tablet Take 1 tablet by mouth every 6 (six) hours as needed.       . Multiple Vitamin (MULTIVITAMIN) tablet Take 1 tablet by mouth daily.      . NON FORMULARY Oxigen 2 liters, take at bedtime.       Marland Kitchen omeprazole (PRILOSEC) 20 MG capsule Take 20 mg by mouth 2 (two) times daily.        . pravastatin (PRAVACHOL) 80 MG tablet TAKE 1 TABLET BY MOUTH DAILY  30 tablet  0  . traZODone (DESYREL) 50 MG tablet Take 50 mg by mouth at bedtime as needed.        Marland Kitchen  zoster vaccine live, PF, (ZOSTAVAX) 57846 UNT/0.65ML injection Inject 0.65 mLs into the skin once.       No current facility-administered medications for this visit.   [name] presents for  periodic oral examination and evaluation of dentures. Upper and lower complete dentures were inserted on 11/04/2011. Patient was last seen for denture adjustment on 06/20/2012. Patient now presents for evaluation of dentures and adjustments as needed.  Chief Complaint  Patient presents with  . Dental Problem    Periodic oral examination and evaluation of upper and lower complete dentures   Patient denies having any dental problems today.  Dental Exam:  General: Patient is a well-developed, well-nourished female in no acute distress Extraoral exam: There is no palpable lymphadenopathy. The patient denies acute TMJ symptoms. Intraoral exam: The patient has normal saliva. Patient is edentulous. There is no evidence of denture irritation or erythema. Dentition: Patient is edentulous.  Prosthodontics: The complete dentures are stable and retentive. Pressure indicating paste applied to upper and lower complete dentures. Adjustments made as needed. [place] Occlusion evaluated and adjustments made as needed for centric relation  and protrusive strokes. Patient accepts results of the adjustment.  Patient to keep dentures out if sore spots arise. Use salt water rinses as needed to aid healing.   RTC as scheduled for denture adjustment. Call if problems arise before then. Pt. dismissed in stable condition.  Lenn Cal, DDS

## 2012-11-29 NOTE — Patient Instructions (Signed)
Instructions for Denture Use and Care  Congratulations, you are on the way to oral rehabilitation!  You have just received a new set of complete or partial dentures.  These prostheses will help to improve both your appearance and chewing ability.  These instructions will help you get adjusted to your dentures as well as care for them properly.  Please read these instructions carefully and completely as soon as you get home.  If you or your caregiver have any questions please notify the [facility] at [phone].  HOW YOUR DENTURES LOOK AND FEEL Soon after you begin wearing your dentures, you may feel that your dentures are too large or even loose.  As our mouth and facial muscles become accustomed to the dentures, these feelings will go away.  You also may feel that you are salivating more than you normally do.  This feeling should go away as you get used to having the dentures in your mouth.  You may bite your cheek or your tongue; this will eventually resolve itself as you wear your dentures.  Some soreness is to be expected, but you should not hurt.  If your mouth hurts, call your dentist.  A denture adhesive may occasionally be necessary to hold your dentures in place more securely.  The dentist will let you know when one is recommended for you.  SPEAKING Wearing dentures will change the sound of your voice initially.  This will be noticed by you more than anyone else.  Bite and swallow before you speak, in order to place your dentures in position so that you may speak more clearly.  Practice speaking by reading aloud or counting from 1 to 100 very slowly and distinctly.  After some practice your mouth will become accustomed to your dentures and you will speak more clearly.  EATING Chewing will definitely be different after you receive your dentures.  With a little practice and patience you should be able to eat just about any kind of food.  Begin by eating small quantities of food  that are cut into small pieces.  Star with soft foods such as eggs, cooked vegetables, or puddings.  As you gain confidence advance  Your diet to whatever texture foods you can tolerate.  DENTURE CARE Dentures can collect plaque and calculus much the same as natural teeth can.  If not removed on a regular basis, your dentures will not look or feel clean, and you will experience denture odor.  It is very important that you remove your dentures at bedtime and clean them thoroughly.  You should: 1. Clean your dentures over a sink full of water so if dropped, breakage will be prevented. 2. Rinse your dentures with cool water to remove any large food particles. 3. Use soap and water or a denture cleanser or paste to clean the dentures.  Do not use regular toothpaste as it may abrade the denture base or teeth. 4. Use a moistened denture brush to clean all surfaces (inside and outside). 5. Rinse thoroughly to remove any remaining soap or denture cleanser. 6. Use a soft bristle toothbrush to gently brush any natural teeth, gums, tongue, and palate at bedtime and before reinserting your dentures. 7. Do not sleep with your dentures in your mouth at night.  Remove your dentures and soak them overnight in a denture cup filled with water or denture solution as recommended by your dentist.  This routine will become second nature and will increase the life and comfort   of your dentures.  Please do not try to adjust these dentures yourself; you could damage them.  FOLLOW-UP You should call or make an appointment with your dentist.  Your dentist would like to see you at least once a year for a check-up and examination. 

## 2012-12-01 ENCOUNTER — Ambulatory Visit (INDEPENDENT_AMBULATORY_CARE_PROVIDER_SITE_OTHER): Payer: Medicare Other | Admitting: Cardiovascular Disease

## 2012-12-01 ENCOUNTER — Encounter: Payer: Self-pay | Admitting: Cardiovascular Disease

## 2012-12-01 VITALS — BP 180/80 | HR 49 | Wt 192.0 lb

## 2012-12-01 DIAGNOSIS — I10 Essential (primary) hypertension: Secondary | ICD-10-CM

## 2012-12-01 DIAGNOSIS — I4891 Unspecified atrial fibrillation: Secondary | ICD-10-CM

## 2012-12-01 DIAGNOSIS — I5032 Chronic diastolic (congestive) heart failure: Secondary | ICD-10-CM

## 2012-12-01 MED ORDER — FUROSEMIDE 20 MG PO TABS: 20.0000 mg | ORAL_TABLET | Freq: Every day | ORAL | Status: DC

## 2012-12-01 MED ORDER — HYDRALAZINE HCL 100 MG PO TABS: 100.0000 mg | ORAL_TABLET | Freq: Three times a day (TID) | ORAL | Status: DC

## 2012-12-01 NOTE — Progress Notes (Signed)
HPI:  62 year old woman presenting for followup evaluation. She was last seen about one year ago. She has extensive peripheral arterial disease and has been followed by Dr. [name]. She also has COPD, hypertension, coronary artery disease, and paroxysmal atrial fibrillation. She presented with non-ST elevation MI in 2012 and underwent multivessel CABG with mitral valve repair and cryo- Cox Maze procedure with left atrial appendage clipping.  She continues to have problems with elevated systolic blood pressures. This is been long-standing. She was recently evaluated in the emergency department [facility] because of severe shortness of breath. She was treated with intravenous Lasix and felt better quickly. She was not admitted to the hospital. She followed up with her primary care physician and was started on oral furosemide on an as-needed basis. Her breathing is improved, but she does have some dyspnea with moderate level activities. She continues to smoke cigarettes. She denies chest pain or pressure. She complains of leg swelling. She denies orthopnea or PND.  Outpatient Encounter Prescriptions as of 12/01/2012  Medication Sig Dispense Refill  . amLODipine (NORVASC) 10 MG tablet Take 10 mg by mouth daily.        Marland Kitchen aspirin 325 MG tablet Take 325 mg by mouth daily.      . carvedilol (COREG) 12.5 MG tablet TAKE 1 TABLET BY MOUTH TWICE DAILY WITH A MEAL  60 tablet  1  . cloNIDine (CATAPRES) 0.1 MG tablet Take 0.1 mg by mouth 2 (two) times daily.      . fenofibrate micronized (LOFIBRA) 134 MG capsule Take 134 mg by mouth at bedtime.        . ferrous gluconate (FERGON) 325 MG tablet Take 325 mg by mouth 2 (two) times daily.       . furosemide (LASIX) 20 MG tablet Take 20 mg by mouth as needed.      . hydrALAZINE (APRESOLINE) 50 MG tablet Take 50 mg by mouth 3 (three) times daily.      Marland Kitchen HYDROcodone-acetaminophen (NORCO/VICODIN) 5-325 MG per tablet Take 1 tablet by mouth as needed  for pain.      . Multiple Vitamin (MULTIVITAMIN) tablet Take 1 tablet by mouth daily.      . NON FORMULARY Oxigen 2 liters, take at bedtime.       Marland Kitchen omeprazole (PRILOSEC) 20 MG capsule Take 20 mg by mouth 2 (two) times daily.        . pravastatin (PRAVACHOL) 80 MG tablet TAKE 1 TABLET BY MOUTH DAILY  30 tablet  0  . traZODone (DESYREL) 50 MG tablet Take 50 mg by mouth at bedtime as needed.        . zoster vaccine live, PF, (ZOSTAVAX) 16109 UNT/0.65ML injection Inject 0.65 mLs into the skin once.      . [DISCONTINUED] buPROPion (ZYBAN) 150 MG 12 hr tablet Take 150 mg by mouth 2 (two) times daily.       . [DISCONTINUED] citalopram (CELEXA) 10 MG tablet Take 20 mg by mouth daily.       . [DISCONTINUED] hydrocodone-acetaminophen (ZYDONE) 5-400 MG per tablet Take 1 tablet by mouth every 6 (six) hours as needed.        No facility-administered encounter medications on file as of 12/01/2012.    Allergies  Allergen Reactions  . Clonidine Hydrochloride     History of nervousness   . Codeine     REACTION: itching  . Contrast Media (Iodinated Diagnostic Agents) Itching    Itching of feet  Past Medical History  Diagnosis Date  . Coronary artery disease 5/09    s/p CABG 7/12   L-LAD, S-OM, S-PDA  . PAD (peripheral artery disease)     Severe; s/p bilateral renal artery stents, moderate in-stent restenosis  . AAA (abdominal aortic aneurysm) 5/09    3.7x3.3 by u/s 2009  . carotid stenosis 5/09    S/p L CEA ;  60-79% bilat ICA by preCABG dopplers 7/12  . Hypertension   . Hyperlipidemia   . COPD (chronic obstructive pulmonary disease)   . GERD (gastroesophageal reflux disease)     With hiatal hernia  . DM2 (diabetes mellitus, type 2)   . Paroxysmal atrial fibrillation     coumadin;  echo 9/07: EF 60%, mild LVH;  s/p Cox Maze 7/12 with LAA clipping  . GIB (gastrointestinal bleeding) 9/11    S/p EGD with cautery at [facility]  . Sleep apnea   . Abnormal EKG     deep TW inversions chronic  . CKD  (chronic kidney disease)   . Mitral regurgitation     3+ MR by intraoperative TEE;  s/p MV repair with Dr. Roxy Manns 7/12    ROS: Negative except as per HPI  BP 180/80  Pulse 49  Wt 87.091 kg (192 lb)  BMI 31.95 kg/m2  PHYSICAL EXAM: Pt is alert and oriented, NAD HEENT: normal Neck: JVP - normal, carotids 2+= with bilateral bruits Lungs: CTA bilaterally CV: RRR with grade 2/6 systolic murmur at the left sternal border Abd: soft, NT, Positive BS, no hepatomegaly Ext: Trace pretibial edema bilaterally Skin: warm/dry no rash  EKG:  Marked sinus bradycardia 49 beats per minute, diffuse ST/T-wave abnormality consider inferolateral ischemia. Prolonged QT.  ASSESSMENT AND PLAN: 1. Acute on chronic, probably diastolic heart failure. Suspect this is related to poorly controlled malignant hypertension. I asked the patient to start taking her furosemide on a daily basis. Also increased her hydralazine to 100 mg 3 times daily. Will repeat an echocardiogram considering her history of mitral valve repair, but she does not have a murmur of mitral regurgitation.  2. Malignant hypertension, uncontrolled. The patient has severe diffuse vascular disease. She has a remote history of renal artery stenting. Will check a renal arterial duplex scan. Antihypertensive changes as noted above. If her renal arteries are patent, will add an ACE inhibitor.  3. Peripheral arterial disease. Followed by Dr. [name].  Will arrange followup in 4 weeks with either Cecille Rubin or [place] for continued med titration.  Sherren Mocha 12/01/2012 2:58 PM

## 2012-12-01 NOTE — Patient Instructions (Addendum)
Your physician has recommended you make the following change in your medication: INCREASE Lasix to daily, INCREASE Hydralazine to 100mg  take one by mouth three times a day  Your physician has requested that you have an echocardiogram. Echocardiography is a painless test that uses sound waves to create images of your heart. It provides your doctor with information about the size and shape of your heart and how well your heart's chambers and valves are working. This procedure takes approximately one hour. There are no restrictions for this procedure.  Your physician has requested that you have a renal artery duplex. During this test, an ultrasound is used to evaluate blood flow to the kidneys. Allow one hour for this exam. Do not eat after midnight the day before and avoid carbonated beverages. Take your medications as you usually do.  Your physician recommends that you return for lab work in: 2 WEEKS (BMP)  Your physician recommends that you schedule a follow-up appointment in: 1 MONTHS with a PA/NP

## 2012-12-08 ENCOUNTER — Ambulatory Visit (HOSPITAL_COMMUNITY): Payer: Medicare Other | Attending: Cardiovascular Disease

## 2012-12-08 DIAGNOSIS — I5032 Chronic diastolic (congestive) heart failure: Secondary | ICD-10-CM

## 2012-12-08 DIAGNOSIS — I509 Heart failure, unspecified: Secondary | ICD-10-CM

## 2012-12-08 DIAGNOSIS — I10 Essential (primary) hypertension: Secondary | ICD-10-CM | POA: Insufficient documentation

## 2012-12-08 DIAGNOSIS — I4891 Unspecified atrial fibrillation: Secondary | ICD-10-CM

## 2012-12-08 NOTE — Progress Notes (Signed)
Echocardiogram performed.  

## 2012-12-18 ENCOUNTER — Encounter (INDEPENDENT_AMBULATORY_CARE_PROVIDER_SITE_OTHER): Payer: Medicare Other

## 2012-12-18 ENCOUNTER — Other Ambulatory Visit (INDEPENDENT_AMBULATORY_CARE_PROVIDER_SITE_OTHER): Payer: Medicare Other

## 2012-12-18 DIAGNOSIS — I10 Essential (primary) hypertension: Secondary | ICD-10-CM

## 2012-12-18 DIAGNOSIS — I5032 Chronic diastolic (congestive) heart failure: Secondary | ICD-10-CM

## 2012-12-18 DIAGNOSIS — I700 Atherosclerosis of aorta: Secondary | ICD-10-CM

## 2012-12-18 DIAGNOSIS — I714 Abdominal aortic aneurysm, without rupture: Secondary | ICD-10-CM

## 2012-12-18 DIAGNOSIS — I4891 Unspecified atrial fibrillation: Secondary | ICD-10-CM

## 2012-12-18 DIAGNOSIS — I701 Atherosclerosis of renal artery: Secondary | ICD-10-CM

## 2012-12-18 LAB — BASIC METABOLIC PANEL
CO2: 31 mEq/L (ref 19–32)
Calcium: 8.9 mg/dL (ref 8.4–10.5)
Creatinine, Ser: 1.9 mg/dL — ABNORMAL HIGH (ref 0.4–1.2)
GFR: 28.34 mL/min — ABNORMAL LOW (ref >60.00)
Sodium: 140 mEq/L (ref 135–145)

## 2012-12-29 ENCOUNTER — Ambulatory Visit: Payer: Self-pay | Admitting: Physician Assistant

## 2013-01-08 ENCOUNTER — Other Ambulatory Visit: Payer: Self-pay | Admitting: Cardiovascular Disease

## 2013-01-09 ENCOUNTER — Ambulatory Visit: Payer: Self-pay | Admitting: Physician Assistant

## 2013-01-25 ENCOUNTER — Ambulatory Visit (INDEPENDENT_AMBULATORY_CARE_PROVIDER_SITE_OTHER): Payer: Medicare Other | Admitting: Physician Assistant

## 2013-01-25 ENCOUNTER — Encounter: Payer: Self-pay | Admitting: Physician Assistant

## 2013-01-25 VITALS — BP 172/62 | HR 56 | Ht 65.0 in | Wt 145.8 lb

## 2013-01-25 DIAGNOSIS — I10 Essential (primary) hypertension: Secondary | ICD-10-CM

## 2013-01-25 DIAGNOSIS — I4891 Unspecified atrial fibrillation: Secondary | ICD-10-CM

## 2013-01-25 DIAGNOSIS — I739 Peripheral vascular disease, unspecified: Secondary | ICD-10-CM

## 2013-01-25 DIAGNOSIS — I2510 Atherosclerotic heart disease of native coronary artery without angina pectoris: Secondary | ICD-10-CM

## 2013-01-25 DIAGNOSIS — I5032 Chronic diastolic (congestive) heart failure: Secondary | ICD-10-CM

## 2013-01-25 DIAGNOSIS — N189 Chronic kidney disease, unspecified: Secondary | ICD-10-CM

## 2013-01-25 LAB — BASIC METABOLIC PANEL
Chloride: 104 mEq/L (ref 96–112)
GFR: 34.96 mL/min — ABNORMAL LOW (ref >60.00)
Potassium: 3.9 mEq/L (ref 3.5–5.1)
Sodium: 138 mEq/L (ref 135–145)

## 2013-01-25 MED ORDER — ISOSORBIDE MONONITRATE ER 30 MG PO TB24: 30.0000 mg | ORAL_TABLET | Freq: Every day | ORAL | Status: DC

## 2013-01-25 NOTE — Patient Instructions (Addendum)
Your physician has recommended you make the following change in your medication: Start Imdur ( 30 mg ) daily, that was sent into your pharmacy today  Your physician recommends that you keep your follow-up appointment with Richardson Dopp, PA-C, on Thursday, June 19th @ 11:30 am  Your physician recommends that you have lab work today: bmet  Get a BP cuff and check your bp at home and bring a record with you to the next visit  Call us if you need [place] at [phone]  I will send a copy of renal dopplers and echo to your pcp

## 2013-01-25 NOTE — Progress Notes (Signed)
[facility]. [address], [address], [place]  [address] Phone: [phone] Fax:  [phone]  Date:  01/25/2013   ID:  [name] Aug 15, 1951, MRN [id number]  PCP:  Phineas Inches, MD  Cardiologist:  Dr. Sherren Mocha     History of Present Illness: [name] is a 62 y.o. female who returns for f/u.  She has a hx of severe PAD (f/u by Dr. [name] of [facility]), CAD, COPD, HTN, parox AFib, CKD, prior GI bleed.  Admitted to [facility] with NSTEMI in setting of COPD exacerbation 02/2011.  F/u Myoview was markedly abnormal and LHC demonstrated 3v CAD with 90% mid to dist LM stenosis.  She was admitted from the office and underwent CABG 03/2011 with MV repair (for significant MR) and Cox Maze procedure with LAA clipping.  I last saw her in 2012.  She was previously followed by Dr. Glori Bickers but now sees Dr. Burt Knack.  She was last seen here 12/01/12.  Prior to that she had gone to [facility] ED with uncontrolled BPs and increased volume treated with IV Lasix.  Her BPs were still high at her visit with Dr. Burt Knack and she was still volume overloaded.  She was placed on daily furosemide and her medications were adjusted.  F/u echo 12/08/12: mild LVH, mild focal basal hypertrophy of the septum, EF 50-55%, trivial AI, mod MS (mean 7), mild LAE, PASP 35.  Echo was felt to be stable and c/w prior MV repair.  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 This was reviewed by Dr. Burt Knack and med Rx was recommended.      She denies chest pain or significant dyspnea.  No orthopnea, PND.  No syncope or near syncope.  She notes less LE edema since taking Lasix QD.    Labs (6/12):   LDL 58 Labs (10/12): Hgb 10.5 Labs (4/14):   K 3.8, Cr 1.9  Wt Readings from Last 3 Encounters:  01/25/13 145 lb 12.8 oz (66.134 kg)  12/01/12 192 lb (87.091 kg)  09/28/12 137 lb (62.143 kg)     Past Medical History  Diagnosis Date  . Coronary artery  disease 5/09    s/p CABG 7/12   L-LAD, S-OM, S-PDA  . PAD (peripheral artery disease)     Severe; s/p bilateral renal artery stents, moderate in-stent restenosis  . AAA (abdominal aortic aneurysm) 5/09    3.7x3.3 by u/s 2009  . carotid stenosis 5/09    S/p L CEA ;  60-79% bilat ICA by preCABG dopplers 7/12  . Hypertension   . Hyperlipidemia   . COPD (chronic obstructive pulmonary disease)   . GERD (gastroesophageal reflux disease)     With hiatal hernia  . DM2 (diabetes mellitus, type 2)   . Paroxysmal atrial fibrillation     coumadin;  echo 9/07: EF 60%, mild LVH;  s/p Cox Maze 7/12 with LAA clipping  . GIB (gastrointestinal bleeding) 9/11    S/p EGD with cautery at [facility]  . Sleep apnea   . Abnormal EKG     deep TW inversions chronic  . CKD (chronic kidney disease)   . Mitral regurgitation     3+ MR by intraoperative TEE;  s/p MV repair with Dr. Roxy Manns 7/12    Current Outpatient Prescriptions  Medication Sig Dispense Refill  . amLODipine (NORVASC) 10 MG tablet Take 10 mg by mouth daily.        Marland Kitchen  aspirin 325 MG tablet Take 325 mg by mouth daily.      . carvedilol (COREG) 12.5 MG tablet TAKE ONE TABLET BY MOUTH TWICE DAILY WITH A MEAL  60 tablet  0  . cloNIDine (CATAPRES) 0.1 MG tablet Take 0.1 mg by mouth 2 (two) times daily.      . fenofibrate micronized (LOFIBRA) 134 MG capsule Take 134 mg by mouth at bedtime.        . ferrous gluconate (FERGON) 325 MG tablet Take 325 mg by mouth 2 (two) times daily.       . furosemide (LASIX) 20 MG tablet Take 1 tablet (20 mg total) by mouth daily.  30 tablet  11  . hydrALAZINE (APRESOLINE) 100 MG tablet Take 1 tablet (100 mg total) by mouth 3 (three) times daily.  90 tablet  11  . HYDROcodone-acetaminophen (NORCO/VICODIN) 5-325 MG per tablet Take 1 tablet by mouth as needed for pain.      . Multiple Vitamin (MULTIVITAMIN) tablet Take 1 tablet by mouth daily.      . NON FORMULARY Oxigen 2 liters, take at bedtime.       Marland Kitchen omeprazole (PRILOSEC) 20  MG capsule Take 20 mg by mouth 2 (two) times daily.        . pravastatin (PRAVACHOL) 80 MG tablet TAKE 1 TABLET BY MOUTH DAILY  30 tablet  0  . traZODone (DESYREL) 50 MG tablet Take 50 mg by mouth at bedtime as needed.         No current facility-administered medications for this visit.    Allergies:    Allergies  Allergen Reactions  . Clonidine Hydrochloride     History of nervousness   . Codeine     REACTION: itching  . Contrast Media (Iodinated Diagnostic Agents) Itching    Itching of feet    Social History:  The patient  reports that she has been smoking.  She has never used smokeless tobacco. She reports that  drinks alcohol. She reports that she does not use illicit drugs.   ROS:  Please see the history of present illness.    All other systems reviewed and negative.   PHYSICAL EXAM: VS:  BP 172/62  Pulse 56  Ht 5\' 5"  (1.651 m)  Wt 145 lb 12.8 oz (66.134 kg)  BMI 24.26 kg/m2 Well nourished, well developed, in no acute distress HEENT: normal Neck: no JVD Cardiac:  normal S1, S2; RRR; 1/6 systolic murmur RUSB Lungs:  clear to auscultation bilaterally, no wheezing, rhonchi or rales Abd: soft, nontender, no hepatomegaly Ext: trace to 1+ ankle edema Skin: warm and dry Neuro:  CNs 2-12 intact, no focal abnormalities noted  EKG:  Sinus brady, HR 56, normal axis, inf and anterolat TWI, no change from prior     ASSESSMENT AND PLAN:  1. Hypertension:  Uncontrolled.  She missed 2 meds yesterday and ate [place] food last night.  With unilateral RAS and CKD, not sure we should try an ACE.  She is on max dose of hydralazine and norvasc.  She cannot take any more coreg as her HR is already in the 55s.  Could consider increasing clonidine.  However, I have suggested add isosorbide to her regimen to see if this will help get her BP under control.  Start Imdur 30 mg QD. I asked her to get a BP cuff and monitor her readings until she returns. 2. Chronic Diastolic CHF:  Volume stable.   Check f/u BMET today. 3. CAD:  No angina.  Continue ASA and statin. 4. PAD:  Managed by Dr. [name].  5. Hyperlipidemia:  Continue statin.  6. Atrial Fibrillation:  No recurrence.  She is not on coumadin due to hx of LAA clipping and prior Maze procedure.  7. AAA:  F/u by Dr. [name]. 8. CKD:   Check BMET today.  9. COPD:  I have advised her to quit smoking.  10. Disposition:  F/u with me in 1 month.   Signed, Richardson Dopp, PA-C  01/25/2013 11:30 AM

## 2013-02-13 ENCOUNTER — Other Ambulatory Visit: Payer: Self-pay | Admitting: Cardiovascular Disease

## 2013-02-21 ENCOUNTER — Telehealth: Payer: Self-pay | Admitting: Cardiovascular Disease

## 2013-02-21 NOTE — Telephone Encounter (Signed)
New problem    C/o side effect to new medication - imdur 30 mg .  Feels like she going to blow up on the inside.

## 2013-02-21 NOTE — Telephone Encounter (Signed)
Pt called to let Dr. Burt Knack know that was  given a prescription for Imdur 30 mg on 01/25/13 office visit . Pt states she had tried this medication before several years ago, and she had side effect, like her chest was going to bow inside out, and pressure. Pt did not start this medication, because she is afraid that it may happen again. Pt also had a F/U visit with Richardson Dopp PA tomorrow and she canceled , because she said it was to F/U BP taken  Imdur.

## 2013-02-22 ENCOUNTER — Ambulatory Visit: Payer: Self-pay | Admitting: Physician Assistant

## 2013-02-28 NOTE — Telephone Encounter (Signed)
Would try losartan 25 mg with follow-up BMET in 1-2 weeks

## 2013-03-01 NOTE — Telephone Encounter (Signed)
Left message on machine for pt to contact the office.   

## 2013-03-13 NOTE — Telephone Encounter (Signed)
Left message on machine for pt to contact the office.   

## 2013-03-15 NOTE — Telephone Encounter (Signed)
The pt has not followed-up with our office in regards to the messages that have been left.  I will close this encounter.

## 2013-06-05 ENCOUNTER — Ambulatory Visit: Payer: Self-pay | Admitting: Cardiovascular Disease

## 2013-07-25 ENCOUNTER — Other Ambulatory Visit: Payer: Self-pay | Admitting: Neurosurgery

## 2013-07-25 DIAGNOSIS — I714 Abdominal aortic aneurysm, without rupture: Secondary | ICD-10-CM

## 2013-07-25 DIAGNOSIS — Z48812 Encounter for surgical aftercare following surgery on the circulatory system: Secondary | ICD-10-CM

## 2013-08-20 ENCOUNTER — Telehealth: Payer: Self-pay

## 2013-08-20 DIAGNOSIS — Z8679 Personal history of other diseases of the circulatory system: Secondary | ICD-10-CM

## 2013-08-20 NOTE — Telephone Encounter (Signed)
I left a message for Elmyra Ricks on her personal, confidential vm with appt date and time.(09/04/13 @ 9:30am)

## 2013-08-20 NOTE — Telephone Encounter (Signed)
Phone call rec'd from Dr. Chales Abrahams office to request earlier appt. to evaluate for renal artery stenosis due to elevated BP's; Dr. Coletta Memos requests to move pt's appt. from 10/02/13, to earlier date.  Discussed with Dr. Trula Slade, in the office today.  Recommends to schedule pt. for appts. for Renal ultrasound, and office visit only, in order to evaluate her sooner, and to keep her January appts. as previously scheduled for surveillance of AAA, Carotids, and PVD.

## 2013-08-22 ENCOUNTER — Ambulatory Visit: Payer: Self-pay | Admitting: Cardiovascular Disease

## 2013-09-01 ENCOUNTER — Other Ambulatory Visit: Payer: Self-pay | Admitting: Cardiovascular Disease

## 2013-09-03 ENCOUNTER — Encounter: Payer: Self-pay | Admitting: Vascular Surgery

## 2013-09-04 ENCOUNTER — Encounter (HOSPITAL_COMMUNITY): Payer: Self-pay | Admitting: Pharmacist

## 2013-09-04 ENCOUNTER — Encounter: Payer: Self-pay | Admitting: Vascular Surgery

## 2013-09-04 ENCOUNTER — Ambulatory Visit (HOSPITAL_COMMUNITY)
Admission: RE | Admit: 2013-09-04 | Discharge: 2013-09-04 | Disposition: A | Discharge status: Home / Self Care | Payer: Medicare Other | Source: Ambulatory Visit | Attending: Vascular Surgery | Admitting: Vascular Surgery

## 2013-09-04 ENCOUNTER — Ambulatory Visit (INDEPENDENT_AMBULATORY_CARE_PROVIDER_SITE_OTHER): Payer: Medicare Other | Admitting: Vascular Surgery

## 2013-09-04 ENCOUNTER — Other Ambulatory Visit: Payer: Self-pay | Admitting: Vascular Surgery

## 2013-09-04 ENCOUNTER — Other Ambulatory Visit: Payer: Self-pay

## 2013-09-04 ENCOUNTER — Encounter (INDEPENDENT_AMBULATORY_CARE_PROVIDER_SITE_OTHER): Payer: Self-pay

## 2013-09-04 VITALS — BP 174/65 | HR 54 | Ht 65.0 in | Wt 145.0 lb

## 2013-09-04 DIAGNOSIS — I10 Essential (primary) hypertension: Secondary | ICD-10-CM

## 2013-09-04 DIAGNOSIS — I701 Atherosclerosis of renal artery: Secondary | ICD-10-CM | POA: Insufficient documentation

## 2013-09-04 DIAGNOSIS — I714 Abdominal aortic aneurysm, without rupture, unspecified: Secondary | ICD-10-CM | POA: Insufficient documentation

## 2013-09-04 DIAGNOSIS — Z48812 Encounter for surgical aftercare following surgery on the circulatory system: Secondary | ICD-10-CM

## 2013-09-04 DIAGNOSIS — Z87448 Personal history of other diseases of urinary system: Secondary | ICD-10-CM

## 2013-09-04 DIAGNOSIS — Z8679 Personal history of other diseases of the circulatory system: Secondary | ICD-10-CM

## 2013-09-04 NOTE — Progress Notes (Signed)
Subjective:     Patient ID: [name], female   DOB: Oct 14, 1950, 62 y.o.   MRN: [id number]  HPI this 61-year-old female is well-known to me having previously had bilateral renal stents in 2004. She developed in-stent stenosis bilaterally and required bilateral PTA of the stents in 2007. She has had no renal procedure since that time. She has required increasing medications over the past year for blood pressure control. Her renal function has been relatively stable. She does have stable claudication symptoms in both calves which we've followed. She has a history of coronary artery bypass grafting as well as left carotid endarterectomy.  Past Medical History  Diagnosis Date  . Coronary artery disease 5/09    s/p CABG 7/12   L-LAD, S-OM, S-PDA  . PAD (peripheral artery disease)     Severe; s/p bilateral renal artery stents, moderate in-stent restenosis  . AAA (abdominal aortic aneurysm) 5/09    3.7x3.3 by u/s 2009  . carotid stenosis 5/09    S/p L CEA ;  60-79% bilat ICA by preCABG dopplers 7/12  . Hypertension   . Hyperlipidemia   . COPD (chronic obstructive pulmonary disease)   . GERD (gastroesophageal reflux disease)     With hiatal hernia  . DM2 (diabetes mellitus, type 2)   . Paroxysmal atrial fibrillation     coumadin;  echo 9/07: EF 60%, mild LVH;  s/p Cox Maze 7/12 with LAA clipping  . GIB (gastrointestinal bleeding) 9/11    S/p EGD with cautery at [facility]  . Sleep apnea   . Abnormal EKG     deep TW inversions chronic  . CKD (chronic kidney disease)   . Mitral regurgitation     3+ MR by intraoperative TEE;  s/p MV repair with Dr. Roxy Manns 7/12  . Myocardial infarction     History  Substance Use Topics  . Smoking status: Current Every Day Smoker -- 1.00 packs/day for 42 years    Last Attempt to Quit: 02/04/2010  . Smokeless tobacco: Never Used  . Alcohol Use: 3.0 oz/week    5 Shots of liquor per week    Family History  Problem Relation Age of Onset  . Emphysema Mother   .  Heart disease Mother     before age 63  . Hypertension Mother   . Hyperlipidemia Mother   . Heart attack Mother   . Heart attack Father   . Emphysema Father   . Heart disease Father     before age 42  . Hyperlipidemia Father   . Hypertension Father   . Peripheral vascular disease Father   . Heart disease Other     Vascular disease in grandparents, uncles and dad  . Diabetes Brother   . Heart disease Brother     before age 6  . Hyperlipidemia Brother   . Hypertension Brother   . Heart attack Brother     Allergies  Allergen Reactions  . Clonidine Hydrochloride     History of nervousness   . Codeine     REACTION: itching  . Contrast Media [Iodinated Diagnostic Agents] Itching    Itching of feet    Current outpatient prescriptions:amLODipine (NORVASC) 10 MG tablet, Take 10 mg by mouth daily.  , Disp: , Rfl: ;  aspirin 325 MG tablet, Take 325 mg by mouth daily., Disp: , Rfl: ;  carvedilol (COREG) 12.5 MG tablet, TAKE 1 TABLET BY MOUTH TWICE DAILY WITH MEALS, Disp: 60 tablet, Rfl: 6;  cloNIDine (CATAPRES) 0.1  MG tablet, Take 0.1 mg by mouth 2 (two) times daily., Disp: , Rfl:  fenofibrate micronized (LOFIBRA) 134 MG capsule, Take 134 mg by mouth at bedtime.  , Disp: , Rfl: ;  ferrous gluconate (FERGON) 325 MG tablet, Take 325 mg by mouth 2 (two) times daily. , Disp: , Rfl: ;  furosemide (LASIX) 20 MG tablet, Take 1 tablet (20 mg total) by mouth daily., Disp: 30 tablet, Rfl: 11;  hydrALAZINE (APRESOLINE) 100 MG tablet, Take 1 tablet (100 mg total) by mouth 3 (three) times daily., Disp: 90 tablet, Rfl: 11 HYDROcodone-acetaminophen (NORCO/VICODIN) 5-325 MG per tablet, Take 1 tablet by mouth as needed for pain., Disp: , Rfl: ;  isosorbide mononitrate (IMDUR) 30 MG 24 hr tablet, Take 1 tablet (30 mg total) by mouth daily., Disp: 30 tablet, Rfl: 6;  Multiple Vitamin (MULTIVITAMIN) tablet, Take 1 tablet by mouth daily., Disp: , Rfl: ;  NON FORMULARY, Oxigen 2 liters, take at bedtime. , Disp: ,  Rfl:  omeprazole (PRILOSEC) 20 MG capsule, Take 20 mg by mouth 2 (two) times daily.  , Disp: , Rfl: ;  pravastatin (PRAVACHOL) 80 MG tablet, TAKE 1 TABLET BY MOUTH DAILY, Disp: 30 tablet, Rfl: 0;  traZODone (DESYREL) 50 MG tablet, Take 50 mg by mouth at bedtime as needed.  , Disp: , Rfl:   BP 174/65  Pulse 54  Ht 5\' 5"  (1.651 m)  Wt 145 lb (65.772 kg)  BMI 24.13 kg/m2  SpO2 97%  Body mass index is 24.13 kg/(m^2).           Review of Systems denies chest pain but does have bilateral calf claudication symptoms. Has palpitations, dyspnea on exertion, wheezing, and occasional severe weakness in both lower extremities which is very transient in nature. Other systems negative and complete review of systems     Objective:   Physical Exam BP 174/65  Pulse 54  Ht 5\' 5"  (1.651 m)  Wt 145 lb (65.772 kg)  BMI 24.13 kg/m2  SpO2 97%  Gen.-alert and oriented x3 in no apparent distress HEENT normal for age Lungs no rhonchi or wheezing Cardiovascular regular rhythm no murmurs carotid pulses 3+ palpable no bruits audible Abdomen soft nontender no palpable masses Musculoskeletal free of  major deformities Skin clear -no rashes Neurologic normal Lower extremities 3+ femoral pulses palpable bilaterally with absent popliteal and distal pulses. Both the well-perfused.  Today I ordered a renal duplex scan and it appears that she does have a significant in-stent stenosis on the right with a mild in-stent stenosis on the left.        Assessment:     Moderately severe right renal in-stent stenosis with previous stents placed in 2004 bilaterally-now with blood pressure which is increasingly difficult to control on 4 medications    Plan:     Plan abdominal angiogram with bilateral renal angiogram and possible PTA of in-stent stenosis bilaterally by Dr. Trula Slade on 09/11/2013-discussed this with patient and she would like to proceed

## 2013-09-11 ENCOUNTER — Ambulatory Visit (HOSPITAL_COMMUNITY)
Admission: RE | Admit: 2013-09-11 | Discharge: 2013-09-11 | Disposition: A | Discharge status: Home / Self Care | Payer: Medicare Other | Source: Ambulatory Visit | Attending: Surgery | Admitting: Surgery

## 2013-09-11 ENCOUNTER — Other Ambulatory Visit: Payer: Self-pay | Admitting: *Deleted

## 2013-09-11 ENCOUNTER — Encounter (HOSPITAL_COMMUNITY)
Admission: RE | Disposition: A | Discharge status: Home / Self Care | Payer: Self-pay | Source: Ambulatory Visit | Attending: Surgery

## 2013-09-11 DIAGNOSIS — N189 Chronic kidney disease, unspecified: Secondary | ICD-10-CM | POA: Insufficient documentation

## 2013-09-11 DIAGNOSIS — I701 Atherosclerosis of renal artery: Secondary | ICD-10-CM

## 2013-09-11 DIAGNOSIS — E785 Hyperlipidemia, unspecified: Secondary | ICD-10-CM | POA: Insufficient documentation

## 2013-09-11 DIAGNOSIS — I714 Abdominal aortic aneurysm, without rupture, unspecified: Secondary | ICD-10-CM

## 2013-09-11 DIAGNOSIS — Z9889 Other specified postprocedural states: Secondary | ICD-10-CM | POA: Insufficient documentation

## 2013-09-11 DIAGNOSIS — K219 Gastro-esophageal reflux disease without esophagitis: Secondary | ICD-10-CM | POA: Insufficient documentation

## 2013-09-11 DIAGNOSIS — G4730 Sleep apnea, unspecified: Secondary | ICD-10-CM | POA: Insufficient documentation

## 2013-09-11 DIAGNOSIS — Z951 Presence of aortocoronary bypass graft: Secondary | ICD-10-CM | POA: Insufficient documentation

## 2013-09-11 DIAGNOSIS — I2510 Atherosclerotic heart disease of native coronary artery without angina pectoris: Secondary | ICD-10-CM | POA: Insufficient documentation

## 2013-09-11 DIAGNOSIS — I129 Hypertensive chronic kidney disease with stage 1 through stage 4 chronic kidney disease, or unspecified chronic kidney disease: Secondary | ICD-10-CM | POA: Insufficient documentation

## 2013-09-11 DIAGNOSIS — T82898A Other specified complication of vascular prosthetic devices, implants and grafts, initial encounter: Secondary | ICD-10-CM

## 2013-09-11 DIAGNOSIS — I252 Old myocardial infarction: Secondary | ICD-10-CM | POA: Insufficient documentation

## 2013-09-11 DIAGNOSIS — F17200 Nicotine dependence, unspecified, uncomplicated: Secondary | ICD-10-CM | POA: Insufficient documentation

## 2013-09-11 DIAGNOSIS — J449 Chronic obstructive pulmonary disease, unspecified: Secondary | ICD-10-CM | POA: Insufficient documentation

## 2013-09-11 DIAGNOSIS — J4489 Other specified chronic obstructive pulmonary disease: Secondary | ICD-10-CM | POA: Insufficient documentation

## 2013-09-11 DIAGNOSIS — E119 Type 2 diabetes mellitus without complications: Secondary | ICD-10-CM | POA: Insufficient documentation

## 2013-09-11 HISTORY — PX: RENAL ANGIOGRAM: SHX5509

## 2013-09-11 LAB — POCT ACTIVATED CLOTTING TIME
ACTIVATED CLOTTING TIME: 188 s
Activated Clotting Time: 171 seconds

## 2013-09-11 LAB — POCT I-STAT, CHEM 8
BUN: 13 mg/dL (ref 6–23)
CHLORIDE: 107 meq/L (ref 96–112)
CREATININE: 1.7 mg/dL — AB (ref 0.50–1.10)
Calcium, Ion: 1.29 mmol/L (ref 1.13–1.30)
GLUCOSE: 128 mg/dL — AB (ref 70–99)
HCT: 36 % (ref 36.0–46.0)
Hemoglobin: 12.2 g/dL (ref 12.0–15.0)
POTASSIUM: 3.6 meq/L — AB (ref 3.7–5.3)
Sodium: 145 mEq/L (ref 137–147)
TCO2: 26 mmol/L (ref 0–100)

## 2013-09-11 LAB — GLUCOSE, CAPILLARY: Glucose-Capillary: 128 mg/dL — ABNORMAL HIGH (ref 70–99)

## 2013-09-11 SURGERY — RENAL ANGIOGRAM
Anesthesia: LOCAL

## 2013-09-11 MED ORDER — DIPHENHYDRAMINE HCL 50 MG/ML IJ SOLN
25.0000 mg | INTRAMUSCULAR | Status: AC
  Administered 2013-09-11: 25 mg via INTRAVENOUS

## 2013-09-11 MED ORDER — LABETALOL HCL 5 MG/ML IV SOLN
INTRAVENOUS | Status: AC
  Filled 2013-09-11: qty 4

## 2013-09-11 MED ORDER — FENTANYL CITRATE 0.05 MG/ML IJ SOLN
INTRAMUSCULAR | Status: AC
  Filled 2013-09-11: qty 2

## 2013-09-11 MED ORDER — MORPHINE SULFATE 10 MG/ML IJ SOLN: 2.0000 mg | INTRAMUSCULAR | Status: DC | PRN

## 2013-09-11 MED ORDER — SODIUM CHLORIDE 0.9 % IV SOLN
INTRAVENOUS | Status: DC
  Administered 2013-09-11: 07:00:00 via INTRAVENOUS

## 2013-09-11 MED ORDER — DIPHENHYDRAMINE HCL 50 MG/ML IJ SOLN
INTRAMUSCULAR | Status: AC
  Filled 2013-09-11: qty 1

## 2013-09-11 MED ORDER — GUAIFENESIN-DM 100-10 MG/5ML PO SYRP
15.0000 mL | ORAL_SOLUTION | ORAL | Status: DC | PRN
  Filled 2013-09-11: qty 15

## 2013-09-11 MED ORDER — METOPROLOL TARTRATE 1 MG/ML IV SOLN: 2.0000 mg | INTRAVENOUS | Status: DC | PRN

## 2013-09-11 MED ORDER — LIDOCAINE HCL (PF) 1 % IJ SOLN
INTRAMUSCULAR | Status: AC
  Filled 2013-09-11: qty 30

## 2013-09-11 MED ORDER — HYDRALAZINE HCL 20 MG/ML IJ SOLN
10.0000 mg | INTRAMUSCULAR | Status: DC | PRN
  Administered 2013-09-11 (×2): 10 mg via INTRAVENOUS
  Filled 2013-09-11 (×2): qty 1

## 2013-09-11 MED ORDER — SODIUM CHLORIDE 0.9 % IV SOLN: 1.0000 mL/kg/h | INTRAVENOUS | Status: DC

## 2013-09-11 MED ORDER — ALUM & MAG HYDROXIDE-SIMETH 200-200-20 MG/5ML PO SUSP
15.0000 mL | ORAL | Status: DC | PRN
  Filled 2013-09-11: qty 30

## 2013-09-11 MED ORDER — HEPARIN (PORCINE) IN NACL 2-0.9 UNIT/ML-% IJ SOLN
INTRAMUSCULAR | Status: AC
  Filled 2013-09-11: qty 1000

## 2013-09-11 MED ORDER — METHYLPREDNISOLONE SODIUM SUCC 125 MG IJ SOLR
INTRAMUSCULAR | Status: AC
  Filled 2013-09-11: qty 2

## 2013-09-11 MED ORDER — MIDAZOLAM HCL 2 MG/2ML IJ SOLN
INTRAMUSCULAR | Status: AC
  Filled 2013-09-11: qty 2

## 2013-09-11 MED ORDER — LABETALOL HCL 5 MG/ML IV SOLN: 10.0000 mg | INTRAVENOUS | Status: DC | PRN

## 2013-09-11 MED ORDER — HEPARIN SODIUM (PORCINE) 1000 UNIT/ML IJ SOLN
INTRAMUSCULAR | Status: AC
  Filled 2013-09-11: qty 1

## 2013-09-11 MED ORDER — FAMOTIDINE IN NACL 20-0.9 MG/50ML-% IV SOLN
20.0000 mg | INTRAVENOUS | Status: AC
  Administered 2013-09-11: 20 mg via INTRAVENOUS

## 2013-09-11 MED ORDER — PHENOL 1.4 % MT LIQD
1.0000 | OROMUCOSAL | Status: DC | PRN
  Filled 2013-09-11: qty 177

## 2013-09-11 MED ORDER — METHYLPREDNISOLONE SODIUM SUCC 125 MG IJ SOLR
125.0000 mg | INTRAMUSCULAR | Status: AC
  Administered 2013-09-11: 125 mg via INTRAVENOUS

## 2013-09-11 MED ORDER — ONDANSETRON HCL 4 MG/2ML IJ SOLN: 4.0000 mg | Freq: Four times a day (QID) | INTRAMUSCULAR | Status: DC | PRN

## 2013-09-11 SURGICAL SUPPLY — 53 items
BANDAGE ELASTIC 4 VELCRO ST LF (GAUZE/BANDAGES/DRESSINGS) IMPLANT
BANDAGE ESMARK 6X9 LF (GAUZE/BANDAGES/DRESSINGS) IMPLANT
BNDG ESMARK 6X9 LF (GAUZE/BANDAGES/DRESSINGS)
CANISTER SUCTION 2500CC (MISCELLANEOUS) ×3 IMPLANT
CLIP TI MEDIUM 24 (CLIP) ×3 IMPLANT
CLIP TI WIDE RED SMALL 24 (CLIP) ×3 IMPLANT
COVER SURGICAL LIGHT HANDLE (MISCELLANEOUS) ×3 IMPLANT
CUFF TOURNIQUET SINGLE 24IN (TOURNIQUET CUFF) IMPLANT
CUFF TOURNIQUET SINGLE 34IN LL (TOURNIQUET CUFF) IMPLANT
CUFF TOURNIQUET SINGLE 44IN (TOURNIQUET CUFF) IMPLANT
DERMABOND ADVANCED (GAUZE/BANDAGES/DRESSINGS) ×1
DERMABOND ADVANCED .7 DNX12 (GAUZE/BANDAGES/DRESSINGS) ×2 IMPLANT
DRAIN CHANNEL 15F RND FF W/TCR (WOUND CARE) IMPLANT
DRAPE WARM FLUID 44X44 (DRAPE) ×3 IMPLANT
DRAPE X-RAY CASS 24X20 (DRAPES) IMPLANT
DRSG COVADERM 4X10 (GAUZE/BANDAGES/DRESSINGS) IMPLANT
DRSG COVADERM 4X8 (GAUZE/BANDAGES/DRESSINGS) IMPLANT
ELECT REM PT RETURN 9FT ADLT (ELECTROSURGICAL) ×3
ELECTRODE REM PT RTRN 9FT ADLT (ELECTROSURGICAL) ×2 IMPLANT
EVACUATOR SILICONE 100CC (DRAIN) IMPLANT
GLOVE BIOGEL PI IND STRL 7.5 (GLOVE) ×2 IMPLANT
GLOVE BIOGEL PI INDICATOR 7.5 (GLOVE) ×1
GLOVE SURG SS PI 7.5 STRL IVOR (GLOVE) ×3 IMPLANT
GOWN PREVENTION PLUS XXLARGE (GOWN DISPOSABLE) ×3 IMPLANT
GOWN STRL NON-REIN LRG LVL3 (GOWN DISPOSABLE) ×9 IMPLANT
HEMOSTAT SNOW SURGICEL 2X4 (HEMOSTASIS) IMPLANT
KIT BASIN OR (CUSTOM PROCEDURE TRAY) ×3 IMPLANT
KIT ROOM TURNOVER OR (KITS) ×3 IMPLANT
MARKER GRAFT CORONARY BYPASS (MISCELLANEOUS) IMPLANT
NS IRRIG 1000ML POUR BTL (IV SOLUTION) ×6 IMPLANT
PACK PERIPHERAL VASCULAR (CUSTOM PROCEDURE TRAY) ×3 IMPLANT
PAD ARMBOARD 7.5X6 YLW CONV (MISCELLANEOUS) ×6 IMPLANT
PADDING CAST COTTON 6X4 STRL (CAST SUPPLIES) IMPLANT
SET COLLECT BLD 21X3/4 12 (NEEDLE) IMPLANT
STOPCOCK 4 WAY LG BORE MALE ST (IV SETS) IMPLANT
SUT ETHILON 3 0 PS 1 (SUTURE) IMPLANT
SUT PROLENE 5 0 C 1 24 (SUTURE) ×3 IMPLANT
SUT PROLENE 6 0 BV (SUTURE) ×3 IMPLANT
SUT PROLENE 7 0 BV 1 (SUTURE) IMPLANT
SUT SILK 2 0 SH (SUTURE) ×3 IMPLANT
SUT SILK 3 0 (SUTURE)
SUT SILK 3-0 18XBRD TIE 12 (SUTURE) IMPLANT
SUT VIC AB 2-0 CT1 27 (SUTURE) ×2
SUT VIC AB 2-0 CT1 TAPERPNT 27 (SUTURE) ×4 IMPLANT
SUT VIC AB 3-0 SH 27 (SUTURE) ×2
SUT VIC AB 3-0 SH 27X BRD (SUTURE) ×4 IMPLANT
SUT VICRYL 4-0 PS2 18IN ABS (SUTURE) ×6 IMPLANT
TOWEL OR 17X24 6PK STRL BLUE (TOWEL DISPOSABLE) ×6 IMPLANT
TOWEL OR 17X26 10 PK STRL BLUE (TOWEL DISPOSABLE) ×6 IMPLANT
TRAY FOLEY CATH 16FRSI W/METER (SET/KITS/TRAYS/PACK) ×3 IMPLANT
TUBING EXTENTION W/L.L. (IV SETS) IMPLANT
UNDERPAD 30X30 INCONTINENT (UNDERPADS AND DIAPERS) ×3 IMPLANT
WATER STERILE IRR 1000ML POUR (IV SOLUTION) ×3 IMPLANT

## 2013-09-11 NOTE — Interval H&P Note (Signed)
History and Physical Interval Note:  09/11/2013 9:20 AM  [name]  has presented today for surgery, with the diagnosis of PVD  The various methods of treatment have been discussed with the patient and family. After consideration of risks, benefits and other options for treatment, the patient has consented to  Procedure(s): ABDOMINAL AORTAGRAM (N/A) RENAL ANGIOGRAM (N/A) as a surgical intervention .  The patient's history has been reviewed, patient examined, no change in status, stable for surgery.  I have reviewed the patient's chart and labs.  Questions were answered to the patient's satisfaction.     BRABHAM IV, V. WELLS

## 2013-09-11 NOTE — Op Note (Signed)
    Patient name: [name] MRN: [id number] DOB: [birth year] Sex: female  09/11/2013 Pre-operative Diagnosis: Recurrent bilateral in-stent renal artery stenosis Post-operative diagnosis:  Same Surgeon:  Eldridge Abrahams Procedure Performed:  1.  ultrasound-guided access, right femoral artery  2.  first order catheterization (right renal artery)  3.  first order catheterization (C. left renal artery)  4.  angioplasty left renal artery  5.  angioplasty right renal artery    Indications:  The patient has previously undergone renal artery stenting.  This has been dilated on one previous occasion many years ago.  She was found to have a difficult time with blood pressure control.  Ultrasound identified bilateral instent stenosis  Procedure:  The patient was identified in the holding area and taken to room 8.  The patient was then placed supine on the table and prepped and draped in the usual sterile fashion.  A time out was called.  Ultrasound was used to evaluate the right common femoral artery.  It was patent .  A digital ultrasound image was acquired.  A micropuncture needle was used to access the right common femoral artery under ultrasound guidance.  An 018 wire was advanced without resistance and a micropuncture sheath was placed.  The 018 wire was removed and a benson wire was placed.  The micropuncture sheath was exchanged for a 5 french sheath.  Using a crossover catheter the right renal stent ostium was selected and a contrast injection was performed which visualized the bilateral stents.  Findings:     Approximately 80% stenosis was identified within both the right renal artery stent as well as the left renal artery stent.  Aneurysmal changes were noted in the infrarenal abdominal aorta.   Intervention:  Using a JR 4 guide catheter and a 014 [place] core wire, the right renal artery stent was selected.  The patient was fully heparinized.  I used a 6 x 15 balloon to perform angioplasty  of the right renal stent.  Completion arteriogram revealed resolution of the in-stent stenosis.  Next the JR 4 catheter was used to  Select the left renal artery stent.  The 6 x 15 balloon was then used to perform balloon angioplasty of the left renal artery stent.  Completion arteriogram revealed resolution of the in-stent stenosis.  At this point, the catheters and wires were removed.  The patient taken the holding area for sheath pull once her coag laser profile corrects.  Impression:  #1  bilateral greater than 75% in-stent renal artery stenosis.  Each stent was dilated using a 6 x 15 balloon with resolution of the in-stent stenosis to less than 10% following the intervention.  #2  a total of 12 cc of contrast was utilized for the procedure, given the patient's baseline creatinine of 1.7.  #3  infrarenal abdominal aortic aneurysm, not well defined on angiogram   V. [name], M.D. [facility] Office: [phone] Pager:  [phone]

## 2013-09-11 NOTE — H&P (View-Only) (Signed)
Subjective:     Patient ID: [name], female   DOB: [birth year], 63 y.o.   MRN: [id number]  HPI this 44-year-old female is well-known to me having previously had bilateral renal stents in 2004. She developed in-stent stenosis bilaterally and required bilateral PTA of the stents in 2007. She has had no renal procedure since that time. She has required increasing medications over the past year for blood pressure control. Her renal function has been relatively stable. She does have stable claudication symptoms in both calves which we've followed. She has a history of coronary artery bypass grafting as well as left carotid endarterectomy.  Past Medical History  Diagnosis Date  . Coronary artery disease 5/09    s/p CABG 7/12   L-LAD, S-OM, S-PDA  . PAD (peripheral artery disease)     Severe; s/p bilateral renal artery stents, moderate in-stent restenosis  . AAA (abdominal aortic aneurysm) 5/09    3.7x3.3 by u/s 2009  . carotid stenosis 5/09    S/p L CEA ;  60-79% bilat ICA by preCABG dopplers 7/12  . Hypertension   . Hyperlipidemia   . COPD (chronic obstructive pulmonary disease)   . GERD (gastroesophageal reflux disease)     With hiatal hernia  . DM2 (diabetes mellitus, type 2)   . Paroxysmal atrial fibrillation     coumadin;  echo 9/07: EF 60%, mild LVH;  s/p Cox Maze 7/12 with LAA clipping  . GIB (gastrointestinal bleeding) 9/11    S/p EGD with cautery at [facility]  . Sleep apnea   . Abnormal EKG     deep TW inversions chronic  . CKD (chronic kidney disease)   . Mitral regurgitation     3+ MR by intraoperative TEE;  s/p MV repair with Dr. Roxy Manns 7/12  . Myocardial infarction     History  Substance Use Topics  . Smoking status: Current Every Day Smoker -- 1.00 packs/day for 42 years    Last Attempt to Quit: 02/04/2010  . Smokeless tobacco: Never Used  . Alcohol Use: 3.0 oz/week    5 Shots of liquor per week    Family History  Problem Relation Age of Onset  . Emphysema Mother   .  Heart disease Mother     before age 30  . Hypertension Mother   . Hyperlipidemia Mother   . Heart attack Mother   . Heart attack Father   . Emphysema Father   . Heart disease Father     before age 50  . Hyperlipidemia Father   . Hypertension Father   . Peripheral vascular disease Father   . Heart disease Other     Vascular disease in grandparents, uncles and dad  . Diabetes Brother   . Heart disease Brother     before age 43  . Hyperlipidemia Brother   . Hypertension Brother   . Heart attack Brother     Allergies  Allergen Reactions  . Clonidine Hydrochloride     History of nervousness   . Codeine     REACTION: itching  . Contrast Media [Iodinated Diagnostic Agents] Itching    Itching of feet    Current outpatient prescriptions:amLODipine (NORVASC) 10 MG tablet, Take 10 mg by mouth daily.  , Disp: , Rfl: ;  aspirin 325 MG tablet, Take 325 mg by mouth daily., Disp: , Rfl: ;  carvedilol (COREG) 12.5 MG tablet, TAKE 1 TABLET BY MOUTH TWICE DAILY WITH MEALS, Disp: 60 tablet, Rfl: 6;  cloNIDine (CATAPRES) 0.1  MG tablet, Take 0.1 mg by mouth 2 (two) times daily., Disp: , Rfl:  fenofibrate micronized (LOFIBRA) 134 MG capsule, Take 134 mg by mouth at bedtime.  , Disp: , Rfl: ;  ferrous gluconate (FERGON) 325 MG tablet, Take 325 mg by mouth 2 (two) times daily. , Disp: , Rfl: ;  furosemide (LASIX) 20 MG tablet, Take 1 tablet (20 mg total) by mouth daily., Disp: 30 tablet, Rfl: 11;  hydrALAZINE (APRESOLINE) 100 MG tablet, Take 1 tablet (100 mg total) by mouth 3 (three) times daily., Disp: 90 tablet, Rfl: 11 HYDROcodone-acetaminophen (NORCO/VICODIN) 5-325 MG per tablet, Take 1 tablet by mouth as needed for pain., Disp: , Rfl: ;  isosorbide mononitrate (IMDUR) 30 MG 24 hr tablet, Take 1 tablet (30 mg total) by mouth daily., Disp: 30 tablet, Rfl: 6;  Multiple Vitamin (MULTIVITAMIN) tablet, Take 1 tablet by mouth daily., Disp: , Rfl: ;  NON FORMULARY, Oxigen 2 liters, take at bedtime. , Disp: ,  Rfl:  omeprazole (PRILOSEC) 20 MG capsule, Take 20 mg by mouth 2 (two) times daily.  , Disp: , Rfl: ;  pravastatin (PRAVACHOL) 80 MG tablet, TAKE 1 TABLET BY MOUTH DAILY, Disp: 30 tablet, Rfl: 0;  traZODone (DESYREL) 50 MG tablet, Take 50 mg by mouth at bedtime as needed.  , Disp: , Rfl:   BP 174/65  Pulse 54  Ht 5\' 5"  (1.651 m)  Wt 145 lb (65.772 kg)  BMI 24.13 kg/m2  SpO2 97%  Body mass index is 24.13 kg/(m^2).           Review of Systems denies chest pain but does have bilateral calf claudication symptoms. Has palpitations, dyspnea on exertion, wheezing, and occasional severe weakness in both lower extremities which is very transient in nature. Other systems negative and complete review of systems     Objective:   Physical Exam BP 174/65  Pulse 54  Ht 5\' 5"  (1.651 m)  Wt 145 lb (65.772 kg)  BMI 24.13 kg/m2  SpO2 97%  Gen.-alert and oriented x3 in no apparent distress HEENT normal for age Lungs no rhonchi or wheezing Cardiovascular regular rhythm no murmurs carotid pulses 3+ palpable no bruits audible Abdomen soft nontender no palpable masses Musculoskeletal free of  major deformities Skin clear -no rashes Neurologic normal Lower extremities 3+ femoral pulses palpable bilaterally with absent popliteal and distal pulses. Both the well-perfused.  Today I ordered a renal duplex scan and it appears that she does have a significant in-stent stenosis on the right with a mild in-stent stenosis on the left.        Assessment:     Moderately severe right renal in-stent stenosis with previous stents placed in 2004 bilaterally-now with blood pressure which is increasingly difficult to control on 4 medications    Plan:     Plan abdominal angiogram with bilateral renal angiogram and possible PTA of in-stent stenosis bilaterally by Dr. Trula Slade on 09/11/2013-discussed this with patient and she would like to proceed

## 2013-09-11 NOTE — Discharge Instructions (Signed)
Groin Site Care °Refer to this sheet in the next few weeks. These instructions provide you with information on caring for yourself after your procedure. Your caregiver may also give you more specific instructions. Your treatment has been planned according to current medical practices, but problems sometimes occur. Call your caregiver if you have any problems or questions after your procedure. °HOME CARE INSTRUCTIONS °· You may shower 24 hours after the procedure. Remove the bandage (dressing) and gently wash the site with plain soap and water. Gently pat the site dry. °· Do not apply powder or lotion to the site. °· Do not sit in a bathtub, swimming pool, or whirlpool for 5 to 7 days. °· No bending, squatting, or lifting anything over 10 pounds (4.5 kg) as directed by your caregiver. °· Inspect the site at least twice daily. °· Do not drive home if you are discharged the same day of the procedure. Have someone else drive you. °· You may drive 24 hours after the procedure unless otherwise instructed by your caregiver. °What to expect: °· Any bruising will usually fade within 1 to 2 weeks. °· Blood that collects in the tissue (hematoma) may be painful to the touch. It should usually decrease in size and tenderness within 1 to 2 weeks. °SEEK IMMEDIATE MEDICAL CARE IF: °· You have unusual pain at the groin site or down the affected leg. °· You have redness, warmth, swelling, or pain at the groin site. °· You have drainage (other than a small amount of blood on the dressing). °· You have chills. °· You have a fever or persistent symptoms for more than 72 hours. °· You have a fever and your symptoms suddenly get worse. °· Your leg becomes pale, cool, tingly, or numb. °· You have heavy bleeding from the site. Hold pressure on the site. °Document Released: 09/25/2010 Document Revised: 11/15/2011 Document Reviewed: 09/25/2010 °Exit[employer]® Patient Information ©2014 [employer]. ° °

## 2013-09-12 ENCOUNTER — Telehealth: Payer: Self-pay | Admitting: Surgery

## 2013-09-12 NOTE — Telephone Encounter (Addendum)
Message copied by Gena Fray on Wed Sep 12, 2013  4:42 PM ------      Message from: Peter Minium K      Created: Wed Sep 12, 2013  3:58 PM      Regarding: RE: schedule       Sure, save her a trip      ----- Message -----         From: Gena Fray         Sent: 09/12/2013   3:26 PM           To: Mena Goes, CMA      Subject: RE: schedule                                             She has a carotid scheduled for 01/27. Should that be rescheduled also?            Hinton Dyer      ----- Message -----         From: Mena Goes, CMA         Sent: 09/11/2013  12:48 PM           To: [facility] Admin Pool      Subject: schedule                                                 Janeice Robinson said to order just Renal Complete and add in the notes that AAA eval also. They don't get paid for 2 separate codes but the 60 minutes is sufficient.             ----- Message -----         From: Alfonso Patten, RN         Sent: 09/11/2013  12:22 PM           To: Mena Goes, CMA, Gena Fray                        ----- Message -----         From: Serafina Mitchell, MD         Sent: 09/11/2013  10:59 AM           To: Patrici Ranks, Alfonso Patten, RN            09/11/2013:            Surgeon:  Eldridge Abrahams      Procedure Performed:       1.  ultrasound-guided access, right femoral artery       2.  first order catheterization (right renal artery)       3.  first order catheterization (C. left renal artery)       4.  angioplasty left renal artery       5.  angioplasty right renal artery                   Please schedule the patient for followup in 6 months with bilateral renal artery ultrasound.  She will also need an abdominal ultrasound to evaluate and aneurysm.  Followup will be  with Dr. [name] or Vinnie Level                   ------  09/12/13: lm for pt, dpm

## 2013-10-02 ENCOUNTER — Other Ambulatory Visit (HOSPITAL_COMMUNITY): Payer: Self-pay

## 2013-10-02 ENCOUNTER — Encounter (HOSPITAL_COMMUNITY): Payer: Self-pay

## 2013-10-02 ENCOUNTER — Ambulatory Visit: Payer: Self-pay | Admitting: Family

## 2013-10-12 ENCOUNTER — Encounter: Payer: Self-pay | Admitting: Cardiovascular Disease

## 2013-10-12 ENCOUNTER — Ambulatory Visit (INDEPENDENT_AMBULATORY_CARE_PROVIDER_SITE_OTHER): Payer: Medicare Other | Admitting: Cardiovascular Disease

## 2013-10-12 VITALS — BP 167/62 | HR 55 | Ht 65.0 in | Wt 145.0 lb

## 2013-10-12 DIAGNOSIS — I714 Abdominal aortic aneurysm, without rupture, unspecified: Secondary | ICD-10-CM

## 2013-10-12 DIAGNOSIS — R002 Palpitations: Secondary | ICD-10-CM

## 2013-10-12 DIAGNOSIS — I4891 Unspecified atrial fibrillation: Secondary | ICD-10-CM

## 2013-10-12 DIAGNOSIS — I10 Essential (primary) hypertension: Secondary | ICD-10-CM

## 2013-10-12 DIAGNOSIS — I701 Atherosclerosis of renal artery: Secondary | ICD-10-CM

## 2013-10-12 MED ORDER — CARVEDILOL 25 MG PO TABS: 25.0000 mg | ORAL_TABLET | Freq: Two times a day (BID) | ORAL | Status: DC

## 2013-10-12 NOTE — Progress Notes (Signed)
HPI:  63 year old woman presenting for followup evaluation. The patient has multiple medical problems with extensive peripheral arterial disease followed by Dr. [name]. She has COPD, hypertension, coronary artery disease, paroxysmal atrial fibrillation, and history of GI bleed. In June 2012 she presented with non-ST elevation MI and underwent catheterization showing severe three-vessel coronary artery disease including left main disease. She also had severe mitral regurgitation she underwent coronary bypass grafting and mitral valve repair during that hospital admission. She also had a CryoCox-Maze procedure with left atrial appendage clipping. followup postoperative echocardiogram in December 2012 showed normal LV size and systolic function. The patient's mitral annuloplasty ring was well seated with mild mitral stenosis and trivial regurgitation. Pulmonary artery pressure was normal. There was mild left atrial dilatation.  The patient is having frequent heart palpitations. She feels frequent flutters and skipped beats as well as tachypalpitations. She's had no chest pain or pressure. Denies dyspnea. Walking is limited by bilateral claudication symptoms. She is able to stroll through the store but cannot go out on a steady walk. She denies lightheadedness or presyncope.  She has extensive vascular disease involving all of her arterial beds. She underwent renal artery angioplasty bilaterally for treatment of severe in-stent restenosis in January. She continues to smoke cigarettes. She is considering vaporized cigarettes in an attempt to quit.   Outpatient Encounter Prescriptions as of 10/12/2013  Medication Sig  . albuterol (PROVENTIL HFA;VENTOLIN HFA) 108 (90 BASE) MCG/ACT inhaler Inhale 2 puffs into the lungs every 4 (four) hours as needed for wheezing or shortness of breath. ProAir  . amLODipine (NORVASC) 10 MG tablet Take 10 mg by mouth daily.    Marland Kitchen aspirin 325 MG tablet Take 325 mg by mouth daily.   . Aspirin-Caffeine 845-65 MG PACK Take 1 packet by mouth daily as needed (aches and pains).  . carvedilol (COREG) 12.5 MG tablet TAKE 1 TABLET BY MOUTH TWICE DAILY WITH MEALS  . cholecalciferol (VITAMIN D) 1000 UNITS tablet Take 2,000 Units by mouth daily.  . cloNIDine (CATAPRES) 0.2 MG tablet Take 0.2 mg by mouth 2 (two) times daily.  . fenofibrate micronized (LOFIBRA) 134 MG capsule Take 134 mg by mouth at bedtime.    . ferrous gluconate (FERGON) 325 MG tablet Take 325 mg by mouth 2 (two) times daily.   . furosemide (LASIX) 20 MG tablet Take 1 tablet (20 mg total) by mouth daily.  . hydrALAZINE (APRESOLINE) 100 MG tablet Take 1 tablet (100 mg total) by mouth 3 (three) times daily.  Marland Kitchen HYDROcodone-acetaminophen (NORCO/VICODIN) 5-325 MG per tablet Take 1 tablet by mouth 2 (two) times daily as needed.   . Multiple Vitamin (MULTIVITAMIN) tablet Take 1 tablet by mouth daily. Hair, skin and nails formulation  . omeprazole (PRILOSEC) 20 MG capsule Take 20 mg by mouth 2 (two) times daily.    . pravastatin (PRAVACHOL) 80 MG tablet TAKE 1 TABLET BY MOUTH DAILY  . traZODone (DESYREL) 50 MG tablet Take 100 mg by mouth at bedtime.     Allergies  Allergen Reactions  . Codeine     REACTION: itching  . Contrast Media [Iodinated Diagnostic Agents] Itching    Itching of feet  . Imdur [Isosorbide Dinitrate]     "felt like my insides were going to explode"  . Percocet [Oxycodone-Acetaminophen] Itching    Tolerates Hydrocodone    Past Medical History  Diagnosis Date  . Coronary artery disease 5/09    s/p CABG 7/12   L-LAD, S-OM, S-PDA  . PAD (peripheral  artery disease)     Severe; s/p bilateral renal artery stents, moderate in-stent restenosis  . AAA (abdominal aortic aneurysm) 5/09    3.7x3.3 by u/s 2009  . carotid stenosis 5/09    S/p L CEA ;  60-79% bilat ICA by preCABG dopplers 7/12  . Hypertension   . Hyperlipidemia   . COPD (chronic obstructive pulmonary disease)   . GERD  (gastroesophageal reflux disease)     With hiatal hernia  . DM2 (diabetes mellitus, type 2)   . Paroxysmal atrial fibrillation     coumadin;  echo 9/07: EF 60%, mild LVH;  s/p Cox Maze 7/12 with LAA clipping  . GIB (gastrointestinal bleeding) 9/11    S/p EGD with cautery at [facility]  . Sleep apnea   . Abnormal EKG     deep TW inversions chronic  . CKD (chronic kidney disease)   . Mitral regurgitation     3+ MR by intraoperative TEE;  s/p MV repair with Dr. Roxy Manns 7/12  . Myocardial infarction     ROS: Negative except as per HPI  BP 167/62  Pulse 55  Ht 5\' 5"  (1.651 m)  Wt 145 lb (65.772 kg)  BMI 24.13 kg/m2  PHYSICAL EXAM: Pt is alert and oriented, NAD HEENT: normal Neck: JVP - normal, carotids 2+= with bilateral bruits left greater than right Lungs: CTA bilaterally CV: RRR without murmur or gallop Abd: soft, NT, Positive BS, no hepatomegaly Ext: no C/C/E Skin: warm/dry no rash  EKG:  Sinus bradycardia 57 beats per minute, T-wave abnormality consider inferolateral ischemia, incomplete right bundle branch block. No significant change since previous tracing 01/26/2013.  ASSESSMENT AND PLAN: 1. Coronary artery disease, native vessel. She is stable without symptoms of angina. She will continue on her current medical program.  2. Malignant hypertension with suboptimal control. Increase carvedilol to 25 mg twice daily. Otherwise continue amlodipine, clonidine, and hydralazine. I think ACE inhibitors or ARB these are probably not a good idea considering her bilateral renal artery stenosis and chronic kidney disease.  3. Hyperlipidemia. She is managed by her primary physician. She's on a combination of fenofibrate and pravastatin.  4. Peripheral arterial disease. Followed by vascular surgery.  5. Renal artery stenosis status post multiple PTA and stenting procedures. Also followed by vascular surgery. Scheduled for duplex scans in July 2015.  6. Mitral regurgitation status post mitral  valve repair. Echocardiogram reviewed demonstrating normal function.  Disposition: Increase carvedilol to 25 mg twice daily. Check 24-hour Holter monitor for evaluation of palpitations. Reduce caffeine. Followup in 6 months.  Sherren Mocha 10/12/2013 12:10 PM

## 2013-10-12 NOTE — Patient Instructions (Signed)
Your physician has recommended that you wear a holter monitor. Holter monitors are medical devices that record the heart's electrical activity. Doctors most often use these monitors to diagnose arrhythmias. Arrhythmias are problems with the speed or rhythm of the heartbeat. The monitor is a small, portable device. You can wear one while you do your normal daily activities. This is usually used to diagnose what is causing palpitations/syncope (passing out).  Your physician has recommended you make the following change in your medication:  INCREASE Carvedilol to 25 mg twice daily  Your physician wants you to follow-up in: 6 months with Dr. Burt Knack.  You will receive a reminder letter in the mail two months in advance. If you don't receive a letter, please call our office to schedule the follow-up appointment.  Your physician recommends that you decrease caffeine consumption.

## 2013-10-19 ENCOUNTER — Encounter (INDEPENDENT_AMBULATORY_CARE_PROVIDER_SITE_OTHER): Payer: Medicare Other

## 2013-10-19 ENCOUNTER — Encounter: Payer: Self-pay | Admitting: Radiology

## 2013-10-19 DIAGNOSIS — R002 Palpitations: Secondary | ICD-10-CM

## 2013-10-19 NOTE — Progress Notes (Signed)
Patient ID: [name], female   DOB: 07-15-1951, 63 y.o.   MRN: [id number] E cardio 24hr holter applied

## 2013-11-14 ENCOUNTER — Telehealth: Payer: Self-pay | Admitting: Nurse Practitioner

## 2013-11-14 NOTE — Telephone Encounter (Signed)
Called patient to report Dr. Antionette Char interpretation of 24 hour monitor.  Per Dr. Burt Knack, monitor showed PVC's, PAC's; rare short runs of PSVT; Carvedilol recently increased to maximal dose; continue to avoid caffeine.  Patient verbalized understanding of this and states episode of palpitations, elevated heart rate do seem to have decreased in frequency since med and caffeine changes.  Patient aware to call with increased symptoms or concerns, otherwise to f/u as scheduled.

## 2013-11-27 ENCOUNTER — Encounter (HOSPITAL_COMMUNITY): Payer: Self-pay | Admitting: Dentistry

## 2013-12-03 ENCOUNTER — Encounter (HOSPITAL_COMMUNITY): Payer: Self-pay | Admitting: Dentistry

## 2013-12-25 ENCOUNTER — Other Ambulatory Visit: Payer: Self-pay | Admitting: Cardiovascular Disease

## 2013-12-31 ENCOUNTER — Other Ambulatory Visit: Payer: Self-pay | Admitting: Cardiovascular Disease

## 2014-04-01 ENCOUNTER — Other Ambulatory Visit (HOSPITAL_COMMUNITY): Payer: Self-pay

## 2014-04-01 ENCOUNTER — Ambulatory Visit: Payer: Self-pay | Admitting: Surgery

## 2014-05-03 ENCOUNTER — Encounter: Payer: Self-pay | Admitting: Surgery

## 2014-05-06 ENCOUNTER — Other Ambulatory Visit (HOSPITAL_COMMUNITY): Payer: Self-pay

## 2014-05-06 ENCOUNTER — Ambulatory Visit: Payer: Self-pay | Admitting: Surgery

## 2014-05-20 ENCOUNTER — Other Ambulatory Visit: Payer: Self-pay | Admitting: Cardiovascular Disease

## 2014-05-22 ENCOUNTER — Other Ambulatory Visit: Payer: Self-pay | Admitting: Vascular Surgery

## 2014-05-22 ENCOUNTER — Ambulatory Visit (HOSPITAL_COMMUNITY)
Admission: RE | Admit: 2014-05-22 | Discharge: 2014-05-22 | Disposition: A | Discharge status: Home / Self Care | Payer: Medicare Other | Source: Ambulatory Visit | Attending: Surgery | Admitting: Surgery

## 2014-05-22 ENCOUNTER — Ambulatory Visit (INDEPENDENT_AMBULATORY_CARE_PROVIDER_SITE_OTHER)
Admission: RE | Admit: 2014-05-22 | Discharge: 2014-05-22 | Disposition: A | Discharge status: Home / Self Care | Payer: Medicare Other | Source: Ambulatory Visit | Attending: Surgery | Admitting: Surgery

## 2014-05-22 DIAGNOSIS — I701 Atherosclerosis of renal artery: Secondary | ICD-10-CM | POA: Diagnosis not present

## 2014-05-22 DIAGNOSIS — I714 Abdominal aortic aneurysm, without rupture, unspecified: Secondary | ICD-10-CM | POA: Diagnosis present

## 2014-05-22 DIAGNOSIS — Z48812 Encounter for surgical aftercare following surgery on the circulatory system: Secondary | ICD-10-CM | POA: Insufficient documentation

## 2014-05-22 DIAGNOSIS — I6529 Occlusion and stenosis of unspecified carotid artery: Secondary | ICD-10-CM

## 2014-05-31 ENCOUNTER — Encounter: Payer: Self-pay | Admitting: Surgery

## 2014-06-03 ENCOUNTER — Encounter: Payer: Self-pay | Admitting: Surgery

## 2014-06-03 ENCOUNTER — Ambulatory Visit (INDEPENDENT_AMBULATORY_CARE_PROVIDER_SITE_OTHER): Payer: Medicare Other | Admitting: Surgery

## 2014-06-03 VITALS — BP 128/70 | HR 56 | Ht 65.0 in | Wt 148.9 lb

## 2014-06-03 DIAGNOSIS — I714 Abdominal aortic aneurysm, without rupture, unspecified: Secondary | ICD-10-CM

## 2014-06-03 DIAGNOSIS — I701 Atherosclerosis of renal artery: Secondary | ICD-10-CM

## 2014-06-03 DIAGNOSIS — I6529 Occlusion and stenosis of unspecified carotid artery: Secondary | ICD-10-CM

## 2014-06-03 NOTE — Addendum Note (Signed)
Addended by: Mena Goes on: 06/03/2014 05:14 PM   Modules accepted: Orders

## 2014-06-03 NOTE — Progress Notes (Signed)
Patient name: [name] MRN: [id number] DOB: 1951-06-05 Sex: female     Chief Complaint  Patient presents with  . Re-evaluation    6 month f/u     HISTORY OF PRESENT ILLNESS: The patient is back for followup.  She is a long-term patient of Dr. [name].  I most recently in January of this year performed angioplasty of bilateral renal artery in-stent stenosis.  She is here today for routine followup.  She complains of claudication in both legs.  She describes it with ambulation her legs will get a week.  She also feels a Noyes coming into her left ear which was similar to what she felt when she had her initial left carotid endarterectomy.  She has also been complaining of blurry vision of the past several weeks.  She was a newly diagnosed diabetic and has just started to wear glasses.  She doesn't that her blood pressure has been stable and has not required any medication changes.  She also states that her left arm gives out with activity.  Past Medical History  Diagnosis Date  . Coronary artery disease 5/09    s/p CABG 7/12   L-LAD, S-OM, S-PDA  . PAD (peripheral artery disease)     Severe; s/p bilateral renal artery stents, moderate in-stent restenosis  . AAA (abdominal aortic aneurysm) 5/09    3.7x3.3 by u/s 2009  . carotid stenosis 5/09    S/p L CEA ;  60-79% bilat ICA by preCABG dopplers 7/12  . Hypertension   . Hyperlipidemia   . COPD (chronic obstructive pulmonary disease)   . GERD (gastroesophageal reflux disease)     With hiatal hernia  . DM2 (diabetes mellitus, type 2)   . Paroxysmal atrial fibrillation     coumadin;  echo 9/07: EF 60%, mild LVH;  s/p Cox Maze 7/12 with LAA clipping  . GIB (gastrointestinal bleeding) 9/11    S/p EGD with cautery at [facility]  . Sleep apnea   . Abnormal EKG     deep TW inversions chronic  . CKD (chronic kidney disease)   . Mitral regurgitation     3+ MR by intraoperative TEE;  s/p MV repair with Dr. Roxy Manns 7/12  . Myocardial infarction     . Anemia   . CHF (congestive heart failure)     Past Surgical History  Procedure Laterality Date  . Carotid endarterectomy      Left  . Abdominal hysterectomy    . Hand surgery      RIGHT WRIST  . Right femoral-popliteal bypass    . Coronary artery bypass graft  03/05/2011    CABG x3 (LIMA to LAD, SVG to OM, SVG to PDA, EVH via left thigh  . Mitral valve repair  03/05/2011    64mm Memo 3D ring annuloplasty for ischemic MR  . Maze  03/05/2011    complete biatrial lesion set with clipping of LA appendage  . Colon bowl resection  2013    History   Social History  . Marital Status: Widowed    Spouse Name: N/A    Number of Children: N/A  . Years of Education: N/A   Occupational History  . Cashier in past    Social History Main Topics  . Smoking status: Current Every Day Smoker -- 1.00 packs/day for 42 years    Types: Cigarettes    Last Attempt to Quit: 02/04/2010  . Smokeless tobacco: Never Used  . Alcohol Use: 3.0 oz/week  5 Shots of liquor per week  . Drug Use: No  . Sexual Activity: Not on file   Other Topics Concern  . Not on file   Social History Narrative   Widowed in 2009.    Family History  Problem Relation Age of Onset  . Emphysema Mother   . Heart disease Mother     before age 10  . Hypertension Mother   . Hyperlipidemia Mother   . Heart attack Mother   . Heart attack Father   . Emphysema Father   . Heart disease Father     before age 9  . Hyperlipidemia Father   . Hypertension Father   . Peripheral vascular disease Father   . Heart disease Other     Vascular disease in grandparents, uncles and dad  . Diabetes Brother   . Heart disease Brother     before age 27  . Hyperlipidemia Brother   . Hypertension Brother   . Heart attack Brother     Allergies as of 06/03/2014 - Review Complete 06/03/2014  Allergen Reaction Noted  . Codeine    . Contrast media [iodinated diagnostic agents] Itching 05/29/2012  . Imdur [isosorbide dinitrate]   09/04/2013  . Percocet [oxycodone-acetaminophen] Itching 09/04/2013    Current Outpatient Prescriptions on File Prior to Visit  Medication Sig Dispense Refill  . albuterol (PROVENTIL HFA;VENTOLIN HFA) 108 (90 BASE) MCG/ACT inhaler Inhale 2 puffs into the lungs every 4 (four) hours as needed for wheezing or shortness of breath. ProAir      . amLODipine (NORVASC) 10 MG tablet Take 10 mg by mouth daily.        Marland Kitchen aspirin 325 MG tablet Take 325 mg by mouth daily.      . Aspirin-Caffeine 845-65 MG PACK Take 1 packet by mouth daily as needed (aches and pains).      . carvedilol (COREG) 25 MG tablet Take 1 tablet (25 mg total) by mouth 2 (two) times daily with a meal.  60 tablet  11  . cholecalciferol (VITAMIN D) 1000 UNITS tablet Take 2,000 Units by mouth daily.      . cloNIDine (CATAPRES) 0.2 MG tablet Take 0.2 mg by mouth 2 (two) times daily.      . fenofibrate micronized (LOFIBRA) 134 MG capsule Take 134 mg by mouth at bedtime.        . ferrous gluconate (FERGON) 325 MG tablet Take 325 mg by mouth 2 (two) times daily.       . furosemide (LASIX) 20 MG tablet TAKE 1 TABLET BY MOUTH EVERY DAY  30 tablet  0  . hydrALAZINE (APRESOLINE) 100 MG tablet TAKE 1 TABLET BY MOUTH THREE TIMES DAILY  90 tablet  0  . HYDROcodone-acetaminophen (NORCO/VICODIN) 5-325 MG per tablet Take 1 tablet by mouth 2 (two) times daily as needed.       . Multiple Vitamin (MULTIVITAMIN) tablet Take 1 tablet by mouth daily. Hair, skin and nails formulation      . omeprazole (PRILOSEC) 20 MG capsule Take 20 mg by mouth 2 (two) times daily.        . pravastatin (PRAVACHOL) 80 MG tablet TAKE 1 TABLET BY MOUTH DAILY  30 tablet  0  . traZODone (DESYREL) 50 MG tablet Take 100 mg by mouth at bedtime.        No current facility-administered medications on file prior to visit.     REVIEW OF SYSTEMS: Please see history of present illness.  In addition, pulmonary is  positive for wheezing  PHYSICAL EXAMINATION:   Vital signs are BP  128/70  Pulse 56  Ht 5\' 5"  (1.651 m)  Wt 148 lb 14.4 oz (67.541 kg)  BMI 24.78 kg/m2  SpO2 98% General: The patient appears their stated age. HEENT:  No gross abnormalities Pulmonary:  Non labored breathing Abdomen: Soft and non-tender Musculoskeletal: There are no major deformities. Neurologic: No focal weakness or paresthesias are detected, Skin: There are no ulcer or rashes noted. Psychiatric: The patient has normal affect. Cardiovascular: There is a regular rate and rhythm without significant murmur appreciated.  Left carotid   Diagnostic Studies I have reviewed her ultrasound studies.  She has previous 59% bilateral carotid stenosis.  She has monophasic left brachial waveforms and a distal left vertebral artery occlusion. Abdominal aneurysm measures 4.3 cm Less than 60% renal artery stenosis bilaterally  Assessment: #1: Carotid occlusive disease #2: Renal artery stenosis #3: Abdominal aortic aneurysm  Plan: #1: The patient's carotid Doppler studies remained stable today.  She does complain of revision, however I feel this most likely is secondary to an ophthalmologic issue.  I have encouraged her to be evaluated again by her private doctor.  She does complain of left arm weakness which sounds like subclavian steal.  She is going to monitor her symptoms to see how limiting and debilitating they are.  She may require revascularization in the future.  She has a carotid Doppler study schedule for one year #2: The patient will followup in one year with a renal artery ultrasound to evaluate her bilateral stents. #3: Her aneurysm now measures 4.3 cm.  I'll repeat the study in 6 months  V. Leia Alf, M.D. [facility] Office: [phone] Pager:  [phone]

## 2014-06-28 ENCOUNTER — Other Ambulatory Visit: Payer: Self-pay | Admitting: Cardiovascular Disease

## 2014-07-01 ENCOUNTER — Encounter: Payer: Self-pay | Admitting: Cardiovascular Disease

## 2014-07-01 ENCOUNTER — Ambulatory Visit (INDEPENDENT_AMBULATORY_CARE_PROVIDER_SITE_OTHER): Payer: Medicare Other | Admitting: Cardiovascular Disease

## 2014-07-01 VITALS — BP 150/62 | HR 54 | Ht 65.0 in | Wt 146.8 lb

## 2014-07-01 DIAGNOSIS — Z9889 Other specified postprocedural states: Secondary | ICD-10-CM

## 2014-07-01 DIAGNOSIS — I5032 Chronic diastolic (congestive) heart failure: Secondary | ICD-10-CM

## 2014-07-01 DIAGNOSIS — I2510 Atherosclerotic heart disease of native coronary artery without angina pectoris: Secondary | ICD-10-CM

## 2014-07-01 DIAGNOSIS — I4891 Unspecified atrial fibrillation: Secondary | ICD-10-CM

## 2014-07-01 DIAGNOSIS — I059 Rheumatic mitral valve disease, unspecified: Secondary | ICD-10-CM

## 2014-07-01 NOTE — Progress Notes (Signed)
Background: The patient has multiple medical problems with extensive peripheral arterial disease followed by Dr. [name]. She has COPD, hypertension, coronary artery disease, paroxysmal atrial fibrillation, and history of GI bleed. In June 2012 she presented with non-ST elevation MI and underwent catheterization showing severe three-vessel coronary artery disease including left main disease. She also had severe mitral regurgitation she underwent coronary bypass grafting and mitral valve repair during that hospital admission. She also had a CryoCox-Maze procedure with left atrial appendage clipping. followup postoperative echocardiogram in December 2012 showed normal LV size and systolic function. The patient's mitral annuloplasty ring was well seated with mild mitral stenosis and trivial regurgitation. Pulmonary artery pressure was normal. There was mild left atrial dilatation.   HPI:  63 year old woman presenting for follow-up [name]. She was last seen in February 2015.  She is here alone today. Has been under a lot of stress. Had bigeminy for a few weeks recently and did not feel with that. Complained of palpitations and fatigue associated with this. Continues to smoke 1 ppd or less. No leg swelling unless high salt consumption.  Other complaints include left arm weakness and pain with exertion, leg pain, and generalized fatigue. Lipids are followed by Dr Coletta Memos.   Studies:  2-D echocardiogram April 2014: Study Conclusions  - Left ventricle: The cavity size was normal. Wall thickness was increased in a pattern of mild LVH. There was mild focal basal hypertrophy of the septum. Systolic function was normal. The estimated ejection fraction was in the range of 50% to 55%. Wall motion was normal; there were no regional wall motion abnormalities. - Aortic valve: Trivial regurgitation. - Mitral valve: Prior procedures included surgical repair. The findings are consistent with moderate  stenosis. - Left atrium: The atrium was mildly dilated. - Pulmonary arteries: Systolic pressure was mildly increased. PA peak pressure: 10mm Hg (S).    Outpatient Encounter Prescriptions as of 07/01/2014  Medication Sig  . albuterol (PROVENTIL HFA;VENTOLIN HFA) 108 (90 BASE) MCG/ACT inhaler Inhale 2 puffs into the lungs every 4 (four) hours as needed for wheezing or shortness of breath. ProAir  . amLODipine (NORVASC) 10 MG tablet Take 10 mg by mouth daily.    Marland Kitchen aspirin 325 MG tablet Take 325 mg by mouth daily.  . Aspirin-Caffeine 845-Amy MG PACK Take 1 packet by mouth daily as needed (aches and pains).  . carvedilol (COREG) 25 MG tablet Take 1 tablet (25 mg total) by mouth 2 (two) times daily with a meal.  . cholecalciferol (VITAMIN D) 1000 UNITS tablet Take 2,000 Units by mouth daily.  . cloNIDine (CATAPRES) 0.2 MG tablet Take 0.2 mg by mouth 2 (two) times daily.  . fenofibrate micronized (LOFIBRA) 134 MG capsule Take 134 mg by mouth at bedtime.    . ferrous gluconate (FERGON) 325 MG tablet Take 325 mg by mouth 2 (two) times daily.   . furosemide (LASIX) 20 MG tablet TAKE 1 TABLET BY MOUTH EVERY DAY  . glipiZIDE (GLUCOTROL XL) 5 MG 24 hr tablet Take 5 mg by mouth daily with breakfast.  . hydrALAZINE (APRESOLINE) 100 MG tablet TAKE 1 TABLET BY MOUTH THREE TIMES DAILY  . HYDROcodone-acetaminophen (NORCO/VICODIN) 5-325 MG per tablet Take 1 tablet by mouth 2 (two) times daily as needed.   . Multiple Vitamin (MULTIVITAMIN) tablet Take 1 tablet by mouth daily. Hair, skin and nails formulation  . omeprazole (PRILOSEC) 20 MG capsule Take 20 mg by mouth 2 (two) times daily.    . pravastatin (PRAVACHOL) 80 MG tablet  TAKE 1 TABLET BY MOUTH DAILY  . traZODone (DESYREL) 50 MG tablet Take 100 mg by mouth at bedtime.   . [DISCONTINUED] furosemide (LASIX) 20 MG tablet TAKE 1 TABLET BY MOUTH EVERY DAY    Allergies  Allergen Reactions  . Codeine     REACTION: itching  . Contrast Media [Iodinated  Diagnostic Agents] Itching    Itching of feet  . Imdur [Isosorbide Dinitrate]     "felt like my insides were going to explode"  . Percocet [Oxycodone-Acetaminophen] Itching    Tolerates Hydrocodone    Past Medical History  Diagnosis Date  . Coronary artery disease 5/09    s/p CABG 7/12   L-LAD, S-OM, S-PDA  . PAD (peripheral artery disease)     Severe; s/p bilateral renal artery stents, moderate in-stent restenosis  . AAA (abdominal aortic aneurysm) 5/09    3.7x3.3 by u/s 2009  . carotid stenosis 5/09    S/p L CEA ;  60-79% bilat ICA by preCABG dopplers 7/12  . Hypertension   . Hyperlipidemia   . COPD (chronic obstructive pulmonary disease)   . GERD (gastroesophageal reflux disease)     With hiatal hernia  . DM2 (diabetes mellitus, type 2)   . Paroxysmal atrial fibrillation     coumadin;  echo 9/07: EF 60%, mild LVH;  s/p Cox Maze 7/12 with LAA clipping  . GIB (gastrointestinal bleeding) 9/11    S/p EGD with cautery at [facility]  . Sleep apnea   . Abnormal EKG     deep TW inversions chronic  . CKD (chronic kidney disease)   . Mitral regurgitation     3+ MR by intraoperative TEE;  s/p MV repair with Dr. Roxy Manns 7/12  . Myocardial infarction   . Anemia   . CHF (congestive heart failure)     family history includes Diabetes in her brother; Emphysema in her father and mother; Heart attack in her brother, father, and mother; Heart disease in her brother, father, mother, and other; Hyperlipidemia in her brother, father, and mother; Hypertension in her brother, father, and mother; Peripheral vascular disease in her father.   ROS: Negative except as per HPI  BP 150/62  Pulse 54  Ht 5\' 5"  (1.651 m)  Wt 146 lb 12.8 oz (66.588 kg)  BMI 24.43 kg/m2  PHYSICAL EXAM: Pt is alert and oriented, NAD HEENT: normal Neck: JVP - normal, carotids 2+= with bilateral bruits Lungs: CTA bilaterally CV: RRR without murmur or gallop Abd: soft, NT, Positive BS, no hepatomegaly Ext: no C/C/E Skin:  warm/dry no rash  EKG:  Sinus bradycardia 54 bpm, RBBB, diffuse T wave abnormality  ASSESSMENT AND PLAN: 1. CAD, native vessel, without angina. Continue med Rx.  2. Hyperlipidemia - managed with pravastatin and fenofibrate by PCP.  3. PAD - appears stable, followed by vascular surgery.  4. Mitral valve disease s/p MV repair - exam stable without murmur. In context of increased ectopy, will check echo to reassess LV function.  5. Malignant HTN. BP improved. Continue current Rx.  6. PVC's/Bigeminy. Check BMET, Mg, and 2D Echo. She will call if symptoms recur and consider further testing with Holter monitor if that occurs.  Sherren Mocha, MD 07/01/2014 11:40 AM

## 2014-07-01 NOTE — Patient Instructions (Signed)
Your physician recommends that you have lab work with you PCP: BMP and Magnesium (order given)  Your physician has requested that you have an echocardiogram. Echocardiography is a painless test that uses sound waves to create images of your heart. It provides your doctor with information about the size and shape of your heart and how well your heart's chambers and valves are working. This procedure takes approximately one hour. There are no restrictions for this procedure.  Your physician wants you to follow-up in: 6 MONTHS with Dr Burt Knack.  You will receive a reminder letter in the mail two months in advance. If you don't receive a letter, please call our office to schedule the follow-up appointment.

## 2014-07-05 ENCOUNTER — Ambulatory Visit (HOSPITAL_COMMUNITY): Payer: Medicare Other | Attending: Cardiology | Admitting: Radiology

## 2014-07-05 ENCOUNTER — Ambulatory Visit: Payer: Self-pay | Admitting: Cardiovascular Disease

## 2014-07-05 DIAGNOSIS — I5032 Chronic diastolic (congestive) heart failure: Secondary | ICD-10-CM | POA: Insufficient documentation

## 2014-07-05 DIAGNOSIS — I2510 Atherosclerotic heart disease of native coronary artery without angina pectoris: Secondary | ICD-10-CM | POA: Insufficient documentation

## 2014-07-05 DIAGNOSIS — I059 Rheumatic mitral valve disease, unspecified: Secondary | ICD-10-CM | POA: Insufficient documentation

## 2014-07-05 DIAGNOSIS — I4891 Unspecified atrial fibrillation: Secondary | ICD-10-CM | POA: Diagnosis not present

## 2014-07-05 DIAGNOSIS — Z9889 Other specified postprocedural states: Secondary | ICD-10-CM

## 2014-07-05 NOTE — Progress Notes (Signed)
Echocardiogram performed.  

## 2014-07-09 ENCOUNTER — Encounter: Payer: Self-pay | Admitting: Cardiovascular Disease

## 2014-07-09 NOTE — Telephone Encounter (Signed)
New message    Patient returning call back to nurse from yesterday.

## 2014-07-09 NOTE — Telephone Encounter (Signed)
This encounter was created in error - please disregard.

## 2014-08-15 ENCOUNTER — Encounter (HOSPITAL_COMMUNITY): Payer: Self-pay | Admitting: Surgery

## 2014-10-21 ENCOUNTER — Other Ambulatory Visit: Payer: Self-pay | Admitting: Nurse Practitioner

## 2014-10-21 DIAGNOSIS — I4891 Unspecified atrial fibrillation: Secondary | ICD-10-CM

## 2014-10-21 DIAGNOSIS — I10 Essential (primary) hypertension: Secondary | ICD-10-CM

## 2014-10-21 MED ORDER — FUROSEMIDE 20 MG PO TABS: 20.0000 mg | ORAL_TABLET | Freq: Every day | ORAL | Status: DC

## 2014-10-21 MED ORDER — CARVEDILOL 25 MG PO TABS: 25.0000 mg | ORAL_TABLET | Freq: Two times a day (BID) | ORAL | Status: DC

## 2014-11-28 ENCOUNTER — Telehealth: Payer: Self-pay

## 2014-11-28 NOTE — Telephone Encounter (Signed)
Phone call from pt.  Stated she is coming to the office on 3/29 for an ultrasound of her AAA, and would like to have a "blockage in her clavicle" checked and treated, also.  Stated " I've had the clavicle blockage for years, and its getting worse."  Reported that her left arm feels like it will give out on her, especially when working above her head.  Denies numbness or tingling.  Stated that the BP is very low in her Left arm.  Stated she spoke to Dr. Trula Slade about this at her last visit.  Noted that Dr. Trula Slade indicated "pt's c/o left arm weakness" and "poss. Subclavian steal" in his office note from 06/03/14.  Advised pt. Will make Dr. [name] aware of complaint with left arm weakness prior to her appt. On 3/29.  Will return call with any further recommendations.  Agrees w/ plan.

## 2014-12-02 ENCOUNTER — Encounter: Payer: Self-pay | Admitting: Vascular Surgery

## 2014-12-03 ENCOUNTER — Ambulatory Visit: Payer: Self-pay | Admitting: Vascular Surgery

## 2014-12-03 ENCOUNTER — Other Ambulatory Visit (HOSPITAL_COMMUNITY): Payer: Self-pay

## 2014-12-03 NOTE — Telephone Encounter (Signed)
Discussed pt's symptoms (L) arm with Dr. [name].  Advised that with the Carotid Ultrasound, there can be special attention to the left subclavian artery, to check for subclavian steal.  Will reschedule the carotid ultrasound to be done in April, at her next appt.  Advised will call pt. back with appt. time change.  Agrees with plan.

## 2014-12-04 NOTE — Telephone Encounter (Signed)
Left message for [name] to call DANA. We need to add a carotid duplex to her appointment. This may require splitting her appointments up on different days.

## 2014-12-09 ENCOUNTER — Other Ambulatory Visit: Payer: Self-pay | Admitting: *Deleted

## 2014-12-09 ENCOUNTER — Encounter: Payer: Self-pay | Admitting: Vascular Surgery

## 2014-12-09 DIAGNOSIS — I6523 Occlusion and stenosis of bilateral carotid arteries: Secondary | ICD-10-CM

## 2014-12-09 NOTE — Telephone Encounter (Signed)
Apparently [name] was told on 12/06/14 that she WAS already scheduled for a carotid duplex, despite the fact that it was NOT in EPIC as scheduled. She stated that she did not receive any messages from our office since talking to the nurse. Rip Harbour has since spoken with the patient and worked out a time for her to have both exams on 12/10/14. Patient was upset that she was told 2 different things and that this appointment has been so hard to get straightened out. Rip Harbour has spoken with her and corrected the error and the patient will be here on 12/10/14 for her carotid.

## 2014-12-10 ENCOUNTER — Ambulatory Visit (INDEPENDENT_AMBULATORY_CARE_PROVIDER_SITE_OTHER)
Admission: RE | Admit: 2014-12-10 | Discharge: 2014-12-10 | Disposition: A | Discharge status: Home / Self Care | Payer: Medicare Other | Source: Ambulatory Visit | Attending: Vascular Surgery | Admitting: Vascular Surgery

## 2014-12-10 ENCOUNTER — Encounter: Payer: Self-pay | Admitting: Vascular Surgery

## 2014-12-10 ENCOUNTER — Ambulatory Visit (HOSPITAL_COMMUNITY)
Admission: RE | Admit: 2014-12-10 | Discharge: 2014-12-10 | Disposition: A | Discharge status: Home / Self Care | Payer: Medicare Other | Source: Ambulatory Visit | Attending: Vascular Surgery | Admitting: Vascular Surgery

## 2014-12-10 ENCOUNTER — Ambulatory Visit (INDEPENDENT_AMBULATORY_CARE_PROVIDER_SITE_OTHER): Payer: Medicare Other | Admitting: Vascular Surgery

## 2014-12-10 VITALS — BP 170/64 | HR 54 | Resp 18 | Ht 65.0 in | Wt 142.0 lb

## 2014-12-10 DIAGNOSIS — I714 Abdominal aortic aneurysm, without rupture, unspecified: Secondary | ICD-10-CM | POA: Insufficient documentation

## 2014-12-10 DIAGNOSIS — I6523 Occlusion and stenosis of bilateral carotid arteries: Secondary | ICD-10-CM | POA: Insufficient documentation

## 2014-12-10 DIAGNOSIS — G458 Other transient cerebral ischemic attacks and related syndromes: Secondary | ICD-10-CM

## 2014-12-10 NOTE — Progress Notes (Signed)
Subjective:     Patient ID: [name], female   DOB: 1951/04/22, 64 y.o.   MRN: [id number]  HPI this 64 year old female returns for follow-up regarding her abdominal aortic aneurysm and carotid occlusive disease. She has previous left carotid endarterectomy in 09. She denies any neurologic symptoms such as amaurosis fugax, aphasia, lateralizing weakness, diplopia. She does develop dizziness particularly in the shower when she has her arms above her head. She has significant left arm claudication symptoms which have been known in the past and this is due to left subclavian occlusive disease. This has not gotten worse nor lead to syncope. She has mild bilateral calf claudication symptoms which are stable.  Past Medical History  Diagnosis Date  . Coronary artery disease 5/09    s/p CABG 7/12   L-LAD, S-OM, S-PDA  . PAD (peripheral artery disease)     Severe; s/p bilateral renal artery stents, moderate in-stent restenosis  . AAA (abdominal aortic aneurysm) 5/09    3.7x3.3 by u/s 2009  . carotid stenosis 5/09    S/p L CEA ;  60-79% bilat ICA by preCABG dopplers 7/12  . Hypertension   . Hyperlipidemia   . COPD (chronic obstructive pulmonary disease)   . GERD (gastroesophageal reflux disease)     With hiatal hernia  . DM2 (diabetes mellitus, type 2)   . Paroxysmal atrial fibrillation     coumadin;  echo 9/07: EF 60%, mild LVH;  s/p Cox Maze 7/12 with LAA clipping  . GIB (gastrointestinal bleeding) 9/11    S/p EGD with cautery at [facility]  . Sleep apnea   . Abnormal EKG     deep TW inversions chronic  . CKD (chronic kidney disease)   . Mitral regurgitation     3+ MR by intraoperative TEE;  s/p MV repair with Dr. Roxy Manns 7/12  . Myocardial infarction   . Anemia   . CHF (congestive heart failure)     History  Substance Use Topics  . Smoking status: Current Every Day Smoker -- 1.00 packs/day for 42 years    Types: Cigarettes    Last Attempt to Quit: 02/04/2010  . Smokeless tobacco: Never Used   . Alcohol Use: 3.0 oz/week    5 Shots of liquor per week    Family History  Problem Relation Age of Onset  . Emphysema Mother   . Heart disease Mother     before age 8  . Hypertension Mother   . Hyperlipidemia Mother   . Heart attack Mother   . Heart attack Father   . Emphysema Father   . Heart disease Father     before age 30  . Hyperlipidemia Father   . Hypertension Father   . Peripheral vascular disease Father   . Heart disease Other     Vascular disease in grandparents, uncles and dad  . Diabetes Brother   . Heart disease Brother     before age 84  . Hyperlipidemia Brother   . Hypertension Brother   . Heart attack Brother     Allergies  Allergen Reactions  . Codeine     REACTION: itching  . Contrast Media [Iodinated Diagnostic Agents] Itching    Itching of feet  . Imdur [Isosorbide Dinitrate]     "felt like my insides were going to explode"  . Percocet [Oxycodone-Acetaminophen] Itching    Tolerates Hydrocodone     Current outpatient prescriptions:  .  albuterol (PROVENTIL HFA;VENTOLIN HFA) 108 (90 BASE) MCG/ACT inhaler, Inhale 2  puffs into the lungs every 4 (four) hours as needed for wheezing or shortness of breath. ProAir, Disp: , Rfl:  .  amLODipine (NORVASC) 10 MG tablet, Take 10 mg by mouth daily.  , Disp: , Rfl:  .  aspirin 325 MG tablet, Take 325 mg by mouth daily., Disp: , Rfl:  .  Aspirin-Caffeine 845-65 MG PACK, Take 1 packet by mouth daily as needed (aches and pains)., Disp: , Rfl:  .  carvedilol (COREG) 25 MG tablet, Take 1 tablet (25 mg total) by mouth 2 (two) times daily with a meal., Disp: 60 tablet, Rfl: 11 .  cloNIDine (CATAPRES) 0.2 MG tablet, Take 0.2 mg by mouth 2 (two) times daily., Disp: , Rfl:  .  fenofibrate micronized (LOFIBRA) 134 MG capsule, Take 134 mg by mouth at bedtime.  , Disp: , Rfl:  .  ferrous gluconate (FERGON) 325 MG tablet, Take 325 mg by mouth 2 (two) times daily. , Disp: , Rfl:  .  furosemide (LASIX) 20 MG tablet, Take 1  tablet (20 mg total) by mouth daily., Disp: 30 tablet, Rfl: 2 .  glipiZIDE (GLUCOTROL XL) 5 MG 24 hr tablet, Take 5 mg by mouth daily with breakfast., Disp: , Rfl:  .  hydrALAZINE (APRESOLINE) 100 MG tablet, TAKE 1 TABLET BY MOUTH THREE TIMES DAILY, Disp: 90 tablet, Rfl: 0 .  HYDROcodone-acetaminophen (NORCO/VICODIN) 5-325 MG per tablet, Take 1 tablet by mouth 3 (three) times daily. , Disp: , Rfl:  .  Multiple Vitamin (MULTIVITAMIN) tablet, Take 1 tablet by mouth daily. Hair, skin and nails formulation, Disp: , Rfl:  .  omeprazole (PRILOSEC) 20 MG capsule, Take 20 mg by mouth 2 (two) times daily.  , Disp: , Rfl:  .  pravastatin (PRAVACHOL) 80 MG tablet, TAKE 1 TABLET BY MOUTH DAILY, Disp: 30 tablet, Rfl: 0 .  traZODone (DESYREL) 50 MG tablet, Take 100 mg by mouth at bedtime. , Disp: , Rfl:  .  cholecalciferol (VITAMIN D) 1000 UNITS tablet, Take 2,000 Units by mouth daily., Disp: , Rfl:   Filed Vitals:   12/10/14 1200 12/10/14 1201  BP: 165/61 170/64  Pulse: 55 54  Resp: 16 18  Height: 5\' 5"  (1.651 m)   Weight: 142 lb (64.411 kg)     Body mass index is 23.63 kg/(m^2).           Review of Systems denies chest pain, dyspnea on exertion. Has chronic back symptoms. Other systems checked. Please see history of present illness. Others negative    Objective:   Physical Exam BP 170/64 mmHg  Pulse 54  Resp 18  Ht 5\' 5"  (1.651 m)  Wt 142 lb (64.411 kg)  BMI 23.63 kg/m2  Gen.-alert and oriented x3 in no apparent distress HEENT normal for age Lungs no rhonchi or wheezing Cardiovascular regular rhythm no murmurs carotid pulses 3+ palpable no bruits audible Abdomen soft nontender no palpable masses Musculoskeletal free of  major deformities Skin clear -no rashes Neurologic normal Lower extremities 3+ femoral pulses palpable bilaterally. Both feet well perfused.  Today I ordered duplex scan of the abdominal aortic aneurysm which is unchanged from previous studies currently 4.3 cm in  maximum diameter Also ordered carotid occlusive disease duplex scanning reviewed and interpreted. There is a 50 mm gradient right arm to left with the left pressure being 82 systolic and monophasic flow with retrograde vertebral flow consistent with left subclavian steal syndrome. Patient has mild internal carotid occlusive disease bilaterally which has not changed significantly.  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 Anabel Bene and mitral valve repair a few years ago. Uncertain whether left internal mammary was used for her bypass graft. Return in 9 months

## 2014-12-11 NOTE — Addendum Note (Signed)
Addended by: Peter Minium K on: 12/11/2014 11:10 AM   Modules accepted: Orders

## 2015-01-29 ENCOUNTER — Ambulatory Visit: Payer: Self-pay | Admitting: Physician Assistant

## 2015-02-23 NOTE — Progress Notes (Signed)
Cardiology Office Note   Date:  02/24/2015   ID:  Malia, Bem [birth year], MRN [id number]  Patient Care Team: Bernerd Limbo, MD as PCP - General (Family Medicine) Sherren Mocha, MD as Consulting Physician (Cardiology) Mal Misty, MD as Consulting Physician (Vascular Surgery)    Chief Complaint  Patient presents with  . Coronary Artery Disease     History of Present Illness: [name] is a 64 y.o. female with a hx of extensive peripheral arterial disease followed by Dr. [name]. She has COPD, hypertension, coronary artery disease, paroxysmal atrial fibrillation, and history of GI bleed. In June 2012 she presented with non-ST elevation MI and underwent catheterization showing severe three-vessel coronary artery disease including left main disease. She also had severe mitral regurgitation.  She underwent coronary bypass grafting and mitral valve repair during that hospital admission. She also had a CryoCox-Maze procedure with left atrial appendage clipping. FU postoperative echocardiogram in December 2012 showed normal LV size and systolic function. The patient's mitral annuloplasty ring was well seated with mild mitral stenosis and trivial regurgitation. Pulmonary artery pressure was normal. There was mild left atrial dilatation.    Last seen by Dr. Sherren Mocha in 06/2014.  FU echo demonstrated normal LVF, stable gradient across the MV repair, PASP 51 mmHg.  Last seen by Dr. [name] in 12/2014.  She was noted to have L subclavian steal with L arm claudication and assoc dizziness.  The patient was not interested in intervention of the L subclavian artery at that time.    She returns for FU.  She had an episode of chest discomfort 2 months ago. This lasted only 2 minutes and quickly subsided. It occurred at rest. She is not that active. She has chronic bilateral lower extremity claudication. She is able to do housework and go up steps. She denies exertional chest symptoms. However,  she does tell me that she feels pressure all over her body when she does activity. She is more limited by her claudication than anything else. She denies syncope. She denies PND. She sleeps on 2 pillows chronically. She has mild pedal edema without significant change.   Studies/Reports Reviewed Today:  Carotid US 12/10/14 R 40-59% L CEA patent L subclavian steal  Abdominal US 12/2014 Distal AAA 4.3 cm  Echo 07/05/14 - EF 55% to 60%. Wall motion was normal;  - Mitral valve: The mean gradient is 12mmHg with pressure half time valve area 1.62cm squared. The findings are consistent with mild to moderate stenosis. Mean gradient (D): 9 mm Hg. Peak gradient (D): 23 mm Hg. Valve area by pressure half-time: 1.62 cm^2. Valve area by continuity equation (using LVOT flow): 1.02 cm^2. - Left atrium: The atrium was mildly dilated. - Right ventricle: The cavity size was mildly dilated. Wallthickness was normal. - Pulmonary arteries: Systolic pressure was moderately increased. PA peak pressure: 51 mm Hg (S). Impressions: - Compared to the prior study, there has been no significant interval change.  Holter x 24 Hrs 10/2013 PVCs, PACs, rare short run of PSVT  Rena Artery [place] 12/2012 60% R RAS; 1-59% L RAS  LHC 03/2011 3v CAD with 90% mid to dist LM stenosis   Past Medical History  Diagnosis Date  . Coronary artery disease 5/09    s/p CABG 7/12   L-LAD, S-OM, S-PDA  . PAD (peripheral artery disease)     Severe; s/p bilateral renal artery stents, moderate in-stent restenosis  . AAA (abdominal aortic aneurysm) 5/09  3.7x3.3 by u/s 2009  . carotid stenosis 5/09    S/p L CEA ;  60-79% bilat ICA by preCABG dopplers 7/12  . Hypertension   . Hyperlipidemia   . COPD (chronic obstructive pulmonary disease)   . GERD (gastroesophageal reflux disease)     With hiatal hernia  . DM2 (diabetes mellitus, type 2)   . Paroxysmal atrial fibrillation     coumadin;  echo 9/07: EF 60%, mild LVH;  s/p Cox  Maze 7/12 with LAA clipping  . GIB (gastrointestinal bleeding) 9/11    S/p EGD with cautery at [facility]  . Sleep apnea   . Abnormal EKG     deep TW inversions chronic  . CKD (chronic kidney disease)   . Mitral regurgitation     3+ MR by intraoperative TEE;  s/p MV repair with Dr. Roxy Manns 7/12  . Myocardial infarction   . Anemia   . CHF (congestive heart failure)     Past Surgical History  Procedure Laterality Date  . Carotid endarterectomy      Left  . Abdominal hysterectomy    . Hand surgery      RIGHT WRIST  . Right femoral-popliteal bypass    . Coronary artery bypass graft  03/05/2011    CABG x3 (LIMA to LAD, SVG to OM, SVG to PDA, EVH via left thigh  . Mitral valve repair  03/05/2011    32mm Memo 3D ring annuloplasty for ischemic MR  . Maze  03/05/2011    complete biatrial lesion set with clipping of LA appendage  . Colon bowl resection  2013  . Renal angiogram Bilateral 09/11/2013    Procedure: RENAL ANGIOGRAM;  Surgeon: Serafina Mitchell, MD;  Location: [facility] CATH LAB;  Service: Cardiovascular;  Laterality: Bilateral;     Current Outpatient Prescriptions  Medication Sig Dispense Refill  . albuterol (PROVENTIL HFA;VENTOLIN HFA) 108 (90 BASE) MCG/ACT inhaler Inhale 2 puffs into the lungs every 4 (four) hours as needed for wheezing or shortness of breath. ProAir    . amLODipine (NORVASC) 10 MG tablet Take 10 mg by mouth daily.      Marland Kitchen aspirin 325 MG tablet Take 325 mg by mouth daily.    . Aspirin-Caffeine 845-65 MG PACK Take 1 packet by mouth daily as needed (aches and pains).    . carvedilol (COREG) 25 MG tablet Take 1 tablet (25 mg total) by mouth 2 (two) times daily with a meal. 60 tablet 11  . cholecalciferol (VITAMIN D) 1000 UNITS tablet Take 2,000 Units by mouth daily.    . cloNIDine (CATAPRES) 0.2 MG tablet Take 0.2 mg by mouth 2 (two) times daily.    . fenofibrate micronized (LOFIBRA) 134 MG capsule Take 134 mg by mouth at bedtime.      . ferrous gluconate (FERGON) 325 MG tablet  Take 325 mg by mouth 2 (two) times daily.     . furosemide (LASIX) 20 MG tablet Take 1 tablet (20 mg total) by mouth daily. 30 tablet 6  . glipiZIDE (GLUCOTROL XL) 5 MG 24 hr tablet Take 5 mg by mouth daily with breakfast.    . hydrALAZINE (APRESOLINE) 100 MG tablet TAKE 1 TABLET BY MOUTH THREE TIMES DAILY 90 tablet 0  . HYDROcodone-acetaminophen (NORCO/VICODIN) 5-325 MG per tablet Take 1 tablet by mouth 3 (three) times daily.     . Multiple Vitamin (MULTIVITAMIN) tablet Take 1 tablet by mouth daily. Hair, skin and nails formulation    . omeprazole (PRILOSEC) 20 MG capsule  Take 20 mg by mouth 2 (two) times daily.      . pravastatin (PRAVACHOL) 80 MG tablet TAKE 1 TABLET BY MOUTH DAILY 30 tablet 0  . traZODone (DESYREL) 50 MG tablet Take 100 mg by mouth at bedtime.      No current facility-administered medications for this visit.    Allergies:   Codeine; Contrast media; Imdur; Percocet; Ioxaglate; and Metrizamide    Social History:  The patient  reports that she has been smoking Cigarettes.  She has a 42 pack-year smoking history. She has never used smokeless tobacco. She reports that she drinks about 3.0 oz of alcohol per week. She reports that she does not use illicit drugs.   Family History:  The patient's family history includes Diabetes in her brother; Emphysema in her father and mother; Heart attack in her brother, father, and mother; Heart disease in her brother, father, mother, and other; Hyperlipidemia in her brother, father, and mother; Hypertension in her brother, father, and mother; Peripheral vascular disease in her father.    ROS:   Please see the history of present illness.   Review of Systems  Cardiovascular: Positive for dyspnea on exertion, irregular heartbeat and leg swelling.  Respiratory: Positive for snoring and wheezing.   Musculoskeletal: Positive for back pain, joint pain and myalgias.  Psychiatric/Behavioral: The patient is nervous/anxious.   All other systems  reviewed and are negative.     PHYSICAL EXAM: VS:  BP 138/50 mmHg  Pulse 55  Ht 5\' 5"  (1.651 m)  Wt 148 lb (67.132 kg)  BMI 24.63 kg/m2    Wt Readings from Last 3 Encounters:  02/24/15 148 lb (67.132 kg)  12/10/14 142 lb (64.411 kg)  07/01/14 146 lb 12.8 oz (66.588 kg)     GEN: Well nourished, well developed, in no acute distress HEENT: normal Neck: no JVD, no masses Cardiac:  Normal 99991111, RRR; 1-2 systolic murmur along LSB, no rubs or gallops, no edema  Respiratory:  clear to auscultation bilaterally, no wheezing, rhonchi or rales. GI: soft, nontender, nondistended, + BS MS: no deformity or atrophy Skin: warm and dry  Neuro:  CNs II-XII intact, Strength and sensation are intact Psych: Normal affect   EKG:  EKG is ordered today.  It demonstrates:   Sinus bradycardia, HR 55, normal axis, RBBB, T-wave inversions in 1, aVL, 2, 3, aVF, V3-V6, no change from prior tracing   Recent Labs: No results found for requested labs within last 365 days.  Labs 11/27/14:  K 3.9, Creatinine 1.9, TC 187, TG 348, HDL 33, LDL 84   Lipid Panel    Component Value Date/Time   CHOL 144 03/05/2011 0446   TRIG 346* 03/05/2011 0446   HDL 17* 03/05/2011 0446   CHOLHDL 8.5 03/05/2011 0446   VLDL 69* 03/05/2011 0446   LDLCALC 58 03/05/2011 0446   LDLDIRECT 78.9 05/07/2008 1301      ASSESSMENT AND PLAN:  Coronary artery disease involving native coronary artery of native heart without angina pectoris:  The patient had an episode of chest discomfort 2 months ago. This was atypical, brief. It has not returned. Activity is mainly limited by bilateral lower extremity claudication. She does note increased fatigue. Her ECG is chronically abnormal. I am not certain that she needs ischemic evaluation at this time. We had a long discussion about monitoring her symptoms. If she notes more chest discomfort or worsening fatigue, we should proceed with stress testing at that point in time. I have encouraged  her  to stop smoking. Continue aspirin, beta blocker, statin.  Mitral valve disorder S/P MVR (mitral valve repair):  Stable gradients by recent echocardiogram. Continue SBE prophylaxis.  Paroxysmal atrial fibrillation S/P Maze operation for atrial fibrillation:  No apparent recurrent atrial fibrillation.  Subclavian steal syndrome:  Intervention has been recommended by Dr. [name] in the past. The patient has been hesitant to proceed. I reviewed her case today with Dr. Burt Knack.  She had a LIMA placed to her LAD at the time of her CABG. Her subclavian stenosis may likely inhibit blood flow through this area. I have encouraged her to go ahead and follow-up with Dr. [name] to discuss intervention.  Renal artery stenosis:  Blood pressure controlled. Creatinine stable. Continue aspirin, statin.  AAA (abdominal aortic aneurysm) without rupture:  Followed by Dr. [name].  HYPERTENSION, BENIGN:  Controlled.  Hyperlipidemia:  Managed by primary care.  Tobacco abuse:  I have asked her to stop smoking.     Medication Changes: Current medicines are reviewed at length with the patient today.  Concerns regarding medicines are as outlined above.  The following changes have been made:   Discontinued Medications   No medications on file   Modified Medications   Modified Medication Previous Medication   FUROSEMIDE (LASIX) 20 MG TABLET furosemide (LASIX) 20 MG tablet      Take 1 tablet (20 mg total) by mouth daily.    Take 1 tablet (20 mg total) by mouth daily.   New Prescriptions   No medications on file    Labs/ tests ordered today include:   Orders Placed This Encounter  Procedures  . EKG 12-Lead     Disposition:   FU with Dr. Sherren Mocha 3 mos.   Signed, Versie Starks, MHS 02/24/2015 4:58 PM    [facility] [place], [place], [place]  [address] Phone: [phone]; Fax: [phone]

## 2015-02-24 ENCOUNTER — Ambulatory Visit (INDEPENDENT_AMBULATORY_CARE_PROVIDER_SITE_OTHER): Payer: Medicare Other | Admitting: Physician Assistant

## 2015-02-24 ENCOUNTER — Encounter: Payer: Self-pay | Admitting: Physician Assistant

## 2015-02-24 VITALS — BP 138/50 | HR 55 | Ht 65.0 in | Wt 148.0 lb

## 2015-02-24 DIAGNOSIS — I10 Essential (primary) hypertension: Secondary | ICD-10-CM

## 2015-02-24 DIAGNOSIS — Z8679 Personal history of other diseases of the circulatory system: Secondary | ICD-10-CM

## 2015-02-24 DIAGNOSIS — E785 Hyperlipidemia, unspecified: Secondary | ICD-10-CM

## 2015-02-24 DIAGNOSIS — I480 Paroxysmal atrial fibrillation: Secondary | ICD-10-CM

## 2015-02-24 DIAGNOSIS — I2510 Atherosclerotic heart disease of native coronary artery without angina pectoris: Secondary | ICD-10-CM

## 2015-02-24 DIAGNOSIS — I714 Abdominal aortic aneurysm, without rupture, unspecified: Secondary | ICD-10-CM

## 2015-02-24 DIAGNOSIS — I701 Atherosclerosis of renal artery: Secondary | ICD-10-CM

## 2015-02-24 DIAGNOSIS — G458 Other transient cerebral ischemic attacks and related syndromes: Secondary | ICD-10-CM

## 2015-02-24 DIAGNOSIS — Z9889 Other specified postprocedural states: Secondary | ICD-10-CM

## 2015-02-24 DIAGNOSIS — I059 Rheumatic mitral valve disease, unspecified: Secondary | ICD-10-CM

## 2015-02-24 MED ORDER — FUROSEMIDE 20 MG PO TABS: 20.0000 mg | ORAL_TABLET | Freq: Every day | ORAL | Status: DC

## 2015-02-24 NOTE — Patient Instructions (Signed)
Medication Instructions: - no changes  Labwork: - none  Procedures/Testing: - none  Follow-Up: - Your physician recommends that you schedule a follow-up appointment in: 3 months with Dr. Burt Knack  Any Additional Special Instructions Will Be Listed Below (If Applicable). - none

## 2015-04-17 HISTORY — PX: APPENDECTOMY: SHX54

## 2015-04-21 ENCOUNTER — Telehealth: Payer: Self-pay | Admitting: Cardiovascular Disease

## 2015-04-21 NOTE — Telephone Encounter (Signed)
Reviewed chart. Probably makes most sense to arrange follow-up appt once she's out of the hospital. That will allow me to review inpatient records and we can discuss whether further cardiac testing is needed once she has recoovered from her acute illness. Please arrange appt in next 4-6 weeks if anything available. thx  Sherren Mocha 04/21/2015 1:30 PM

## 2015-04-21 NOTE — Telephone Encounter (Signed)
New message     [place] [facility] [place] had emergency appendectomy on Thursday (August 11) Pt daughter states stress on pt heart and they were instructed to call pt cardiologist to let them know what is going on If you cannot reach [facility] please call Almyra Free @ [phone]

## 2015-04-21 NOTE — Telephone Encounter (Signed)
Spoke with daughter and advised Lauren RN with Dr Burt Knack would be in touch in a few days to schedule appointment

## 2015-04-21 NOTE — Telephone Encounter (Signed)
I spoke with the patient's daughter [name]. She reports that the patient was having some symptoms of vomiting about a week ago (lasted about 6 days). When this was evaluated the patient's appendix had already perfurated and was infected. She had cardiac enzymes that were done and showed some elevation- per the patient's daughter, the cardiologist there stated she may of had an MI vs the stress of her infection making this rise. Per [name]- the patient has some type of clavicle blockage and the physicians at [facility] feels she needs to be re-evaluated when she gets home. In reviewing her chart, the patient has already had an evaluation with Dr. [name] for subclavian steal- and he recommended intervention for this but the patient was hesitant to proceed. I advised [name] I would forward to Dr. Burt Knack for review and see what his recommendations are. I advised he is not in the office today, but we will call back this week- will also forward to [place]. The patient's discharge date has been undetermined at this time- possibly the end of the week per [facility]. [name] would like [place] to call her sister Almyra Free at the # below (confirmed) if we are unable to reach her regarding an appointment.

## 2015-04-24 NOTE — Telephone Encounter (Signed)
I attempted to contact [facility] but she does not have voicemail.  I called Ronnette Juniper and left a message on her voicemail to contact our office in regards to the pt's current condition.

## 2015-04-24 NOTE — Telephone Encounter (Signed)
I spoke with the pt's daughter Almyra Free and she said the pt is still hospitalized.  At this time they are trying to control the pt's pain and transition the pt from IV to PO pain medication.  I made her aware that the pt needs to sign a release of information prior to discharge so that all of her records can be sent to our office for review.  I also made her aware that I will contact them with an appointment closer to the 4-6 week time frame.

## 2015-06-06 ENCOUNTER — Encounter: Payer: Self-pay | Admitting: Vascular Surgery

## 2015-06-09 NOTE — Telephone Encounter (Signed)
Reviewed with Dr. Burt Knack and he can see pt on June 19, 2015. I spoke with pt and appt made for her to see Dr. Burt Knack on June 19, 2015 at 9:45

## 2015-06-10 ENCOUNTER — Other Ambulatory Visit (HOSPITAL_COMMUNITY): Payer: Self-pay

## 2015-06-10 ENCOUNTER — Other Ambulatory Visit: Payer: Self-pay

## 2015-06-10 ENCOUNTER — Encounter: Payer: Self-pay | Admitting: Vascular Surgery

## 2015-06-10 ENCOUNTER — Ambulatory Visit (INDEPENDENT_AMBULATORY_CARE_PROVIDER_SITE_OTHER): Payer: Medicare Other | Admitting: Vascular Surgery

## 2015-06-10 ENCOUNTER — Other Ambulatory Visit: Payer: Self-pay | Admitting: Vascular Surgery

## 2015-06-10 ENCOUNTER — Ambulatory Visit (HOSPITAL_COMMUNITY)
Admission: RE | Admit: 2015-06-10 | Discharge: 2015-06-10 | Disposition: A | Discharge status: Home / Self Care | Payer: Medicare Other | Source: Ambulatory Visit | Attending: Vascular Surgery | Admitting: Vascular Surgery

## 2015-06-10 VITALS — BP 203/75 | HR 60 | Temp 97.5°F | Resp 16 | Ht 65.0 in | Wt 147.0 lb

## 2015-06-10 DIAGNOSIS — E119 Type 2 diabetes mellitus without complications: Secondary | ICD-10-CM | POA: Diagnosis not present

## 2015-06-10 DIAGNOSIS — E785 Hyperlipidemia, unspecified: Secondary | ICD-10-CM | POA: Diagnosis not present

## 2015-06-10 DIAGNOSIS — I6523 Occlusion and stenosis of bilateral carotid arteries: Secondary | ICD-10-CM | POA: Diagnosis present

## 2015-06-10 DIAGNOSIS — G458 Other transient cerebral ischemic attacks and related syndromes: Secondary | ICD-10-CM | POA: Diagnosis not present

## 2015-06-10 DIAGNOSIS — N189 Chronic kidney disease, unspecified: Secondary | ICD-10-CM | POA: Diagnosis not present

## 2015-06-10 DIAGNOSIS — I129 Hypertensive chronic kidney disease with stage 1 through stage 4 chronic kidney disease, or unspecified chronic kidney disease: Secondary | ICD-10-CM | POA: Diagnosis not present

## 2015-06-10 DIAGNOSIS — I708 Atherosclerosis of other arteries: Secondary | ICD-10-CM | POA: Diagnosis not present

## 2015-06-10 DIAGNOSIS — Z48812 Encounter for surgical aftercare following surgery on the circulatory system: Secondary | ICD-10-CM

## 2015-06-10 DIAGNOSIS — I771 Stricture of artery: Secondary | ICD-10-CM

## 2015-06-10 NOTE — Progress Notes (Signed)
Filed Vitals:   06/10/15 1057 06/10/15 1100 06/10/15 1103  BP: 207/73 133/74 203/75  Pulse: 62 62 60  Temp:  97.5 F (36.4 C)   TempSrc:  Oral   Resp:  16   Height:  5\' 5"  (1.651 m)   Weight:  147 lb (66.679 kg)   SpO2:  98%

## 2015-06-10 NOTE — Progress Notes (Signed)
Subjective:     Patient ID: [name], female   DOB: 07-19-1951, 64 y.o.   MRN: [id number] .HPI 64 year old female returns for further follow-up regarding her left subclavian occlusive disease. She has known left subclavian stenosis with retrograde flow in left vertebral artery but at last visit despite some left arm claudication symptoms she did not want to proceed with stenting or surgery. Patient has had an extensive cardiac procedure performed by Dr. Roxy Manns in 2011 with coronary artery bypass grafting and maze procedure with valve replacement. Patient had urgent appendectomy performed at the [facility] in August 2016 and postoperatively apparently had positive cardiac enzymes. It was not felt that she had a myocardial infarction. She is not having chest pain at the current time. She is on daily aspirin. She continues to have easy fatigability of her left upper extremity with any activity particularly when she is doing something with her arms over her head. She does have some dizziness and blurred vision on occasion but no syncope. She denies that her lysing weakness, aphasia, amaurosis fugax, or syncope. She now feels that the left subclavian artery needs to be addressed because the cardiologist at the [facility] stated that this could be a initiating factor. Patient did have LIMA graft to her left anterior descending coronary artery at the time of her cardiac procedure.  Past Medical History  Diagnosis Date  . Coronary artery disease 5/09    s/p CABG 7/12   L-LAD, S-OM, S-PDA  . PAD (peripheral artery disease) (HCC)     Severe; s/p bilateral renal artery stents, moderate in-stent restenosis  . AAA (abdominal aortic aneurysm) ([place]) 5/09    3.7x3.3 by u/s 2009  . carotid stenosis 5/09    S/p L CEA ;  60-79% bilat ICA by preCABG dopplers 7/12  . Hypertension   . Hyperlipidemia   . COPD (chronic obstructive pulmonary disease) ([place])   . GERD (gastroesophageal reflux disease)     With hiatal hernia  . DM2  (diabetes mellitus, type 2) ([place])   . Paroxysmal atrial fibrillation (HCC)     coumadin;  echo 9/07: EF 60%, mild LVH;  s/p Cox Maze 7/12 with LAA clipping  . GIB (gastrointestinal bleeding) 9/11    S/p EGD with cautery at [facility]  . Sleep apnea   . Abnormal EKG     deep TW inversions chronic  . CKD (chronic kidney disease)   . Mitral regurgitation     3+ MR by intraoperative TEE;  s/p MV repair with Dr. Roxy Manns 7/12  . Myocardial infarction ([place])   . Anemia   . CHF (congestive heart failure) ([place])     Social History  Substance Use Topics  . Smoking status: Current Every Day Smoker -- 1.00 packs/day for 42 years    Types: Cigarettes  . Smokeless tobacco: Never Used  . Alcohol Use: 3.0 oz/week    5 Shots of liquor per week    Family History  Problem Relation Age of Onset  . Emphysema Mother   . Heart disease Mother     before age 53  . Hypertension Mother   . Hyperlipidemia Mother   . Heart attack Mother   . Heart attack Father   . Emphysema Father   . Heart disease Father     before age 26  . Hyperlipidemia Father   . Hypertension Father   . Peripheral vascular disease Father   . Heart disease Other     Vascular disease in grandparents, uncles and  dad  . Diabetes Brother   . Heart disease Brother     before age 61  . Hyperlipidemia Brother   . Hypertension Brother   . Heart attack Brother     Allergies  Allergen Reactions  . Codeine     REACTION: itching  . Contrast Media [Iodinated Diagnostic Agents] Itching    Itching of feet  . Imdur [Isosorbide Dinitrate]     "felt like my insides were going to explode"  . Percocet [Oxycodone-Acetaminophen] Itching    Tolerates Hydrocodone  . Ioxaglate Itching    Itching of feet  . Metrizamide Itching    Itching of feet     Current outpatient prescriptions:  .  amLODipine (NORVASC) 10 MG tablet, Take 10 mg by mouth daily.  , Disp: , Rfl:  .  aspirin 325 MG tablet, Take 325 mg by mouth daily., Disp: , Rfl:  .   Aspirin-Caffeine 845-65 MG PACK, Take 1 packet by mouth daily as needed (aches and pains)., Disp: , Rfl:  .  carvedilol (COREG) 25 MG tablet, Take 1 tablet (25 mg total) by mouth 2 (two) times daily with a meal., Disp: 60 tablet, Rfl: 11 .  cholecalciferol (VITAMIN D) 1000 UNITS tablet, Take 2,000 Units by mouth daily., Disp: , Rfl:  .  cloNIDine (CATAPRES) 0.2 MG tablet, Take 0.2 mg by mouth 2 (two) times daily., Disp: , Rfl:  .  fenofibrate micronized (LOFIBRA) 134 MG capsule, Take 134 mg by mouth at bedtime.  , Disp: , Rfl:  .  ferrous gluconate (FERGON) 325 MG tablet, Take 325 mg by mouth 2 (two) times daily. , Disp: , Rfl:  .  furosemide (LASIX) 20 MG tablet, Take 1 tablet (20 mg total) by mouth daily., Disp: 30 tablet, Rfl: 6 .  glipiZIDE (GLUCOTROL XL) 5 MG 24 hr tablet, Take 5 mg by mouth daily with breakfast., Disp: , Rfl:  .  hydrALAZINE (APRESOLINE) 100 MG tablet, TAKE 1 TABLET BY MOUTH THREE TIMES DAILY, Disp: 90 tablet, Rfl: 0 .  HYDROcodone-acetaminophen (NORCO/VICODIN) 5-325 MG per tablet, Take 1 tablet by mouth 3 (three) times daily. , Disp: , Rfl:  .  omeprazole (PRILOSEC) 20 MG capsule, Take 20 mg by mouth 2 (two) times daily.  , Disp: , Rfl:  .  pravastatin (PRAVACHOL) 80 MG tablet, TAKE 1 TABLET BY MOUTH DAILY, Disp: 30 tablet, Rfl: 0 .  zolpidem (AMBIEN) 10 MG tablet, Take 10 mg by mouth at bedtime., Disp: , Rfl:  .  albuterol (PROVENTIL HFA;VENTOLIN HFA) 108 (90 BASE) MCG/ACT inhaler, Inhale 2 puffs into the lungs every 4 (four) hours as needed for wheezing or shortness of breath. ProAir, Disp: , Rfl:  .  Multiple Vitamin (MULTIVITAMIN) tablet, Take 1 tablet by mouth daily. Hair, skin and nails formulation, Disp: , Rfl:  .  traZODone (DESYREL) 50 MG tablet, Take 100 mg by mouth at bedtime. , Disp: , Rfl:   Filed Vitals:   06/10/15 1057 06/10/15 1100 06/10/15 1103  BP: 207/73 133/74 203/75  Pulse: 62 62 60  Temp:  97.5 F (36.4 C)   TempSrc:  Oral   Resp:  16   Height:   5\' 5"  (1.651 m)   Weight:  147 lb (66.679 kg)   SpO2:  98%     Body mass index is 24.46 kg/(m^2).        Marland Kitchen Review of Systems patient has history of renovascular hypertension and has had stents and bilateral renal arteries. Had angioplasty of in-stent  stenosis performed by Dr. Trula Slade last year bilaterally. Patient has abdominal aortic aneurysm measuring 4.3 cm in maximum diameter. Denies chest pain, dyspnea on exertion, PND, orthopnea, hemoptysis. Has mild calf claudication bilaterally which is stable. Had appendectomy performed 2 months ago urgently. Other systems negative and complete review of systems other than what was noted in history of present illness     Objective:   Physical Exam BP 203/75 mmHg  Pulse 60  Temp(Src) 97.5 F (36.4 C) (Oral)  Resp 16  Ht 5\' 5"  (1.651 m)  Wt 147 lb (66.679 kg)  BMI 24.46 kg/m2  SpO2 98%  Gen.-alert and oriented x3 in no apparent distress HEENT normal for age Lungs no rhonchi or wheezing Cardiovascular regular rhythm no murmurs carotid pulses 3+ palpable-harsh left supraclavicular bruit audible Soft right carotid bruit Abdomen soft nontender no palpable masses Musculoskeletal free of  major deformities Skin clear -no rashes Neurologic normal Lower extremities 3+ femoral pulses bilaterally palpable. No distal pulses palpable. Both feet well perfused. Right upper extremity with 3+ radial pulse palpable. Left upper extremity 1-2+ radial pulse palpable.   Today I ordered a carotid duplex exam which I reviewed and interpreted. There continues to be a mild stenosis at the left carotid endarterectomy site which is unchanged. There continues to be a severe left subclavian stenosis with retrograde flow in the left vertebral artery. There is a 60 mm gradient with right brachial pressures higher than left brachial pressures.       Assessment:     Left subclavian stenosis with retrograde flow left vertebral and possible steal from LIMA graft  to left anterior descending coronary artery Recent appendectomy with positive cardiac enzymes postoperatively-no myocardial infarction History of Cox maze procedure, mitral valve repair, coronary artery bypass grafting in 2012 by Dr. Roxy Manns Abdominal aortic aneurysm 4.3 cm stable History of left carotid endarterectomy by Dr. [name] 2009 with mild restenosis as distal end of patch     Plan:     #1 patient has appointment to see Dr. Burt Knack regarding her cardiac status October 13 We'll schedule arch aortogram by Dr. Trula Slade with possible PTA or stenting left subclavian artery if felt to be low risk and straightforward since patient has LIMA graft to LAD #3 if patient is operative candidate and not able to be treated with percutaneous approach-she will need left carotid-subclavian bypass graft Discussed this with patient and she is agreeable to proceed.

## 2015-06-16 ENCOUNTER — Encounter: Payer: Self-pay | Admitting: *Deleted

## 2015-06-17 ENCOUNTER — Ambulatory Visit (INDEPENDENT_AMBULATORY_CARE_PROVIDER_SITE_OTHER): Payer: Medicare Other | Admitting: Cardiovascular Disease

## 2015-06-17 ENCOUNTER — Encounter: Payer: Self-pay | Admitting: Cardiovascular Disease

## 2015-06-17 VITALS — BP 130/62 | HR 58 | Ht 65.0 in | Wt 151.0 lb

## 2015-06-17 DIAGNOSIS — I25119 Atherosclerotic heart disease of native coronary artery with unspecified angina pectoris: Secondary | ICD-10-CM

## 2015-06-17 LAB — CBC
HCT: 36.1 % (ref 36.0–46.0)
HEMOGLOBIN: 11.8 g/dL — AB (ref 12.0–15.0)
MCH: 29.9 pg (ref 26.0–34.0)
MCHC: 32.7 g/dL (ref 30.0–36.0)
MCV: 91.4 fL (ref 78.0–100.0)
MPV: 10.7 fL (ref 8.6–12.4)
Platelets: 309 10*3/uL (ref 150–400)
RBC: 3.95 MIL/uL (ref 3.87–5.11)
RDW: 15.9 % — ABNORMAL HIGH (ref 11.5–15.5)
WBC: 14.8 10*3/uL — ABNORMAL HIGH (ref 4.0–10.5)

## 2015-06-17 NOTE — Progress Notes (Signed)
Cardiology Office Note Date:  06/19/2015   ID:  [name], DOB 09/29/1950, MRN [id number]  PCP:  Phineas Inches, MD  Cardiologist:  Sherren Mocha, MD    Chief Complaint  Patient presents with  . Shortness of Breath   History of Present Illness: [name] is a 64 y.o. female who presents for follow-up evaluation.  The patient has been followed for coronary artery disease and mitral valve disease. In 2012 she presented with non-ST elevation MI and was found to have left main and severe three-vessel disease as well as severe mitral regurgitation. She underwent CABG with mitral valve repair during that admission. She also had a CryoCox-Maze procedure with left atrial appendage clipping. FU postoperative echocardiogram in December 2012 showed normal LV size and systolic function. The patient's mitral annuloplasty ring was well seated with mild mitral stenosis and trivial regurgitation. Pulmonary artery pressure was normal. There was mild left atrial dilatation.   She has extensive vascular disease and has undergone multiple renal artery stent and PTA procedures, carotid endarterectomy (2009), and known left subclavian stenosis.   She was hospitalized at [facility] in August with a ruptured appendix and had laprascopic appendectomy. Also was noted to have a pelvic abscess. Post-operatively she had elevated troponin values and was seen by cardiology. It was recommended that she undergo treatment of left subclavian stenosis since she's had LIMA-LAD grafting. She presents today for further discussion.   She admits to symptoms of palpitations, chest pain, and shortness of breath. Denies orthopnea or PND but states that her weight is up a few pounds. She has substernal pressure associated with palpitations, non-radiating pain. This feels similar to her symptoms prior to CABG.  Past Medical History  Diagnosis Date  . Coronary artery disease 5/09    s/p CABG 7/12   L-LAD, S-OM,  S-PDA  . PAD (peripheral artery disease) (HCC)     Severe; s/p bilateral renal artery stents, moderate in-stent restenosis  . AAA (abdominal aortic aneurysm) ([place]) 5/09    3.7x3.3 by u/s 2009  . carotid stenosis 5/09    S/p L CEA ;  60-79% bilat ICA by preCABG dopplers 7/12  . Hypertension   . Hyperlipidemia   . COPD (chronic obstructive pulmonary disease) ([place])   . GERD (gastroesophageal reflux disease)     With hiatal hernia  . DM2 (diabetes mellitus, type 2) ([place])   . Paroxysmal atrial fibrillation (HCC)     coumadin;  echo 9/07: EF 60%, mild LVH;  s/p Cox Maze 7/12 with LAA clipping  . GIB (gastrointestinal bleeding) 9/11    S/p EGD with cautery at [facility]  . Sleep apnea   . Abnormal EKG     deep TW inversions chronic  . CKD (chronic kidney disease)   . Mitral regurgitation     3+ MR by intraoperative TEE;  s/p MV repair with Dr. Roxy Manns 7/12  . Myocardial infarction ([place])   . Anemia   . CHF (congestive heart failure) [facility])     Past Surgical History  Procedure Laterality Date  . Carotid endarterectomy      Left  . Abdominal hysterectomy    . Hand surgery      RIGHT WRIST  . Right femoral-popliteal bypass    . Coronary artery bypass graft  03/05/2011    CABG x3 (LIMA to LAD, SVG to OM, SVG to PDA, EVH via left thigh  . Mitral valve repair  03/05/2011    19mm Memo 3D ring  annuloplasty for ischemic MR  . Maze  03/05/2011    complete biatrial lesion set with clipping of LA appendage  . Colon bowl resection  2013  . Renal angiogram Bilateral 09/11/2013    Procedure: RENAL ANGIOGRAM;  Surgeon: Serafina Mitchell, MD;  Location[facility] C[facility] LAB;  Service: Cardiovascular;  Laterality: Bilateral;  . Appendectomy  Aug. 11, 2016    Ruptured    Current Outpatient Prescriptions  Medication Sig Dispense Refill  . budesonide-formoterol (SYMBICORT) 160-4.5 MCG/ACT inhaler Inhale into the lungs.    . predniSONE (DELTASONE) 10 MG tablet 3 tabs daily x 3 days, then 2 tabs daily x 3 days, then one  tab daily x 3 days.    Marland Kitchen albuterol (PROVENTIL HFA;VENTOLIN HFA) 108 (90 BASE) MCG/ACT inhaler Inhale 2 puffs into the lungs every 4 (four) hours as needed for wheezing or shortness of breath. ProAir    . amLODipine (NORVASC) 10 MG tablet Take 10 mg by mouth daily.      Marland Kitchen aspirin 325 MG tablet Take 325 mg by mouth daily.    . Aspirin-Caffeine 845-65 MG PACK Take 1 packet by mouth daily as needed (aches and pains).    . carvedilol (COREG) 25 MG tablet Take 1 tablet (25 mg total) by mouth 2 (two) times daily with a meal. 60 tablet 11  . cholecalciferol (VITAMIN D) 1000 UNITS tablet Take 2,000 Units by mouth daily.    . cloNIDine (CATAPRES) 0.2 MG tablet Take 0.2 mg by mouth 2 (two) times daily.    . fenofibrate micronized (LOFIBRA) 134 MG capsule Take 134 mg by mouth at bedtime.      . ferrous gluconate (FERGON) 325 MG tablet Take 325 mg by mouth 2 (two) times daily.     . furosemide (LASIX) 20 MG tablet Take 1 tablet (20 mg total) by mouth daily. 30 tablet 6  . glipiZIDE (GLUCOTROL XL) 5 MG 24 hr tablet Take 5 mg by mouth daily with breakfast.    . hydrALAZINE (APRESOLINE) 100 MG tablet TAKE 1 TABLET BY MOUTH THREE TIMES DAILY 90 tablet 0  . HYDROcodone-acetaminophen (NORCO/VICODIN) 5-325 MG per tablet Take 1 tablet by mouth 3 (three) times daily.     . Multiple Vitamin (MULTIVITAMIN) tablet Take 1 tablet by mouth daily. Hair, skin and nails formulation    . omeprazole (PRILOSEC) 20 MG capsule Take 20 mg by mouth 2 (two) times daily.      . pravastatin (PRAVACHOL) 80 MG tablet TAKE 1 TABLET BY MOUTH DAILY 30 tablet 0  . traZODone (DESYREL) 50 MG tablet Take 100 mg by mouth at bedtime.     Marland Kitchen zolpidem (AMBIEN) 10 MG tablet Take 10 mg by mouth at bedtime.     No current facility-administered medications for this visit.    Allergies:   Codeine; Contrast media; Imdur; Percocet; Ioxaglate; and Metrizamide   Social History:  The patient  reports that she has been smoking Cigarettes.  She has a 42  pack-year smoking history. She has never used smokeless tobacco. She reports that she drinks about 3.0 oz of alcohol per week. She reports that she does not use illicit drugs.   Family History:  The patient's  family history includes AAA (abdominal aortic aneurysm) in her mother; Diabetes in her brother; Emphysema in her father and mother; Heart attack in her brother and mother; Heart attack (age of onset: 52) in her father; Heart disease in her brother, father, mother, and other; Hyperlipidemia in her brother, father, and  mother; Hypertension in her brother, father, and mother; Peripheral vascular disease in her father.    ROS:  Please see the history of present illness.  Otherwise, review of systems is positive for weight gain, cough, exertional dyspnea, depression, fatigue, wheezing, and anxiety.  All other systems are reviewed and negative.    PHYSICAL EXAM: VS:  BP 130/62 mmHg  Pulse 58  Ht 5\' 5"  (1.651 m)  Wt 151 lb (68.493 kg)  BMI 25.13 kg/m2  SpO2 98% , BMI Body mass index is 25.13 kg/(m^2). GEN: Well nourished, well developed, in no acute distress HEENT: normal Neck: no JVD, no masses. Bilateral bruits Cardiac: RRR with 2/6 harsh systolic murmur at the RUSB/LUSB              Respiratory:  clear to auscultation bilaterally, normal work of breathing GI: soft, nontender, nondistended, + BS MS: no deformity or atrophy Ext: no pretibial edema, diminished pulse on the right brachial/radial Skin: warm and dry, no rash Neuro:  Strength and sensation are intact Psych: euthymic mood, full affect  EKG:  EKG is not ordered today.  Recent Labs: 06/17/2015: BUN 20; Creat 1.32*; Hemoglobin 11.8*; Platelets 309; Potassium 5.5*; Sodium 140   Lipid Panel     Component Value Date/Time   CHOL 144 03/05/2011 0446   TRIG 346* 03/05/2011 0446   HDL 17* 03/05/2011 0446   CHOLHDL 8.5 03/05/2011 0446   VLDL 69* 03/05/2011 0446   LDLCALC 58 03/05/2011 0446   LDLDIRECT 78.9 05/07/2008 1301       Wt Readings from Last 3 Encounters:  06/17/15 151 lb (68.493 kg)  06/10/15 147 lb (66.679 kg)  02/24/15 148 lb (67.132 kg)     Cardiac Studies Reviewed: CXR 06-11-15 (Care Everywhere) IMPRESSION: Post CABG and MVR.  No acute abnormalities.  2D Echo: 07-06-2015: Study Conclusions  - Left ventricle: The cavity size was normal. Wall thickness was normal. Systolic function was normal. The estimated ejection fraction was in the range of 55% to 60%. Wall motion was normal; there were no regional wall motion abnormalities. - Mitral valve: The mean gradient is 70mmHg with pressure half time valve area 1.62cm squared. The findings are consistent with mild to moderate stenosis. Mean gradient (D): 9 mm Hg. Peak gradient (D): 23 mm Hg. Valve area by pressure half-time: 1.62 cm^2. Valve area by continuity equation (using LVOT flow): 1.02 cm^2. - Left atrium: The atrium was mildly dilated. - Right ventricle: The cavity size was mildly dilated. Wall thickness was normal. - Pulmonary arteries: Systolic pressure was moderately increased. PA peak pressure: 51 mm Hg (S).  Impressions:  - Compared to the prior study, there has been no significant interval change.  ASSESSMENT AND PLAN: 1.  CAD, native vessel, with angina at rest 2. Symptomatic left subclavian stenosis 3. PAD with renal atherosclerosis, carotid and subclavian stenosis 4. Mitral valve disease s/p MV repair 5. PAF: no recurrence since Cryo-Cox Maze at the time of surgery 6. HTN with chronic kidney disease Stage 3 7. Hyperlipidemia 8. Tobacco abuse  Pt for upcoming left subclavian angiography and stenting for treatment of severe subclavian stenosis in the setting of a patent LIMA graft. Considering her anginal symptoms, I think it is best not to assume all of her symptoms are related to ischemia in the LIMA/LAD distribution. I have recommended cardiac catheterization and possible PCI at the time of  subclavian angiography. I have discussed her case with Dr Trula Slade and we will coordinate cardiac cath to be done at  the same time as subclavian angiography. I have reviewed the risks, indications, and alternatives to cardiac catheterization and possible PCI with the patient. Risks include but are not limited to bleeding, infection, vascular injury, stroke, myocardial infection, arrhythmia, kidney injury, radiation-related injury in the case of prolonged fluoroscopy use, emergency cardiac surgery, and death. The patient understands the risks of serious complication is low (123456).  In the meantime, her current medications will be continued. Labs will be drawn today.  Current medicines are reviewed with the patient today.  The patient does not have concerns regarding medicines.  Labs/ tests ordered today include:   Orders Placed This Encounter  Procedures  . Basic Metabolic Panel (BMET)  . CBC  . INR/PT    Disposition:   FU pending cath results  Signed, Sherren Mocha, MD  06/19/2015 6:47 AM    [facility] [address], [place], [place]  [address] Phone: [phone]; Fax: [phone]

## 2015-06-17 NOTE — Patient Instructions (Signed)
Medication Instructions:  Your physician recommends that you continue on your current medications as directed. Please refer to the Current Medication list given to you today.  Labwork: Your physician recommends that you have lab work today: BMP, CBC and PT/INR  Testing/Procedures: We will contact you in regards to arranging cardiac catheterization next week.   Follow-Up: Your physician wants you to follow-up in: 6 MONTHS with Dr Burt Knack.  You will receive a reminder letter in the mail two months in advance. If you don't receive a letter, please call our office to schedule the follow-up appointment.   Any Other Special Instructions Will Be Listed Below (If Applicable).

## 2015-06-18 LAB — PROTIME-INR
INR: 0.89 (ref <1.50)
PROTHROMBIN TIME: 12.1 s (ref 11.6–15.2)

## 2015-06-18 LAB — BASIC METABOLIC PANEL
BUN: 20 mg/dL (ref 7–25)
CALCIUM: 9.1 mg/dL (ref 8.6–10.4)
CO2: 25 mmol/L (ref 20–31)
CREATININE: 1.32 mg/dL — AB (ref 0.50–0.99)
Chloride: 102 mmol/L (ref 98–110)
Glucose, Bld: 124 mg/dL — ABNORMAL HIGH (ref 65–99)
Potassium: 5.5 mmol/L — ABNORMAL HIGH (ref 3.5–5.3)
SODIUM: 140 mmol/L (ref 135–146)

## 2015-06-19 ENCOUNTER — Other Ambulatory Visit: Payer: Self-pay

## 2015-06-19 ENCOUNTER — Ambulatory Visit: Payer: Self-pay | Admitting: Cardiovascular Disease

## 2015-06-19 ENCOUNTER — Encounter: Payer: Self-pay | Admitting: Cardiovascular Disease

## 2015-06-19 MED ORDER — PREDNISONE 20 MG PO TABS: ORAL_TABLET | ORAL | Status: DC

## 2015-06-20 ENCOUNTER — Other Ambulatory Visit: Payer: Self-pay | Admitting: Cardiovascular Disease

## 2015-06-20 DIAGNOSIS — I208 Other forms of angina pectoris: Secondary | ICD-10-CM

## 2015-06-23 DIAGNOSIS — I771 Stricture of artery: Secondary | ICD-10-CM | POA: Diagnosis present

## 2015-06-24 ENCOUNTER — Encounter (HOSPITAL_COMMUNITY): Payer: Self-pay | Admitting: Surgery

## 2015-06-24 ENCOUNTER — Encounter (HOSPITAL_COMMUNITY)
Admission: AD | Disposition: A | Discharge status: Home / Self Care | Payer: Self-pay | Source: Ambulatory Visit | Attending: Cardiology

## 2015-06-24 ENCOUNTER — Other Ambulatory Visit: Payer: Self-pay | Admitting: *Deleted

## 2015-06-24 ENCOUNTER — Inpatient Hospital Stay (HOSPITAL_COMMUNITY)
Admission: AD | Admit: 2015-06-24 | Discharge: 2015-06-26 | DRG: 247 | Disposition: A | Discharge status: Home / Self Care | Payer: Medicare Other | Source: Ambulatory Visit | Attending: Cardiology | Admitting: Cardiology

## 2015-06-24 DIAGNOSIS — K219 Gastro-esophageal reflux disease without esophagitis: Secondary | ICD-10-CM | POA: Diagnosis present

## 2015-06-24 DIAGNOSIS — Z79899 Other long term (current) drug therapy: Secondary | ICD-10-CM | POA: Diagnosis not present

## 2015-06-24 DIAGNOSIS — Z9862 Peripheral vascular angioplasty status: Secondary | ICD-10-CM

## 2015-06-24 DIAGNOSIS — Z951 Presence of aortocoronary bypass graft: Secondary | ICD-10-CM

## 2015-06-24 DIAGNOSIS — E1122 Type 2 diabetes mellitus with diabetic chronic kidney disease: Secondary | ICD-10-CM | POA: Diagnosis present

## 2015-06-24 DIAGNOSIS — I25710 Atherosclerosis of autologous vein coronary artery bypass graft(s) with unstable angina pectoris: Principal | ICD-10-CM | POA: Diagnosis present

## 2015-06-24 DIAGNOSIS — I742 Embolism and thrombosis of arteries of the upper extremities: Secondary | ICD-10-CM | POA: Diagnosis not present

## 2015-06-24 DIAGNOSIS — F17210 Nicotine dependence, cigarettes, uncomplicated: Secondary | ICD-10-CM | POA: Diagnosis present

## 2015-06-24 DIAGNOSIS — I2584 Coronary atherosclerosis due to calcified coronary lesion: Secondary | ICD-10-CM | POA: Diagnosis present

## 2015-06-24 DIAGNOSIS — I6522 Occlusion and stenosis of left carotid artery: Secondary | ICD-10-CM | POA: Diagnosis present

## 2015-06-24 DIAGNOSIS — I6529 Occlusion and stenosis of unspecified carotid artery: Secondary | ICD-10-CM | POA: Diagnosis present

## 2015-06-24 DIAGNOSIS — J449 Chronic obstructive pulmonary disease, unspecified: Secondary | ICD-10-CM | POA: Diagnosis present

## 2015-06-24 DIAGNOSIS — I708 Atherosclerosis of other arteries: Secondary | ICD-10-CM | POA: Diagnosis present

## 2015-06-24 DIAGNOSIS — D649 Anemia, unspecified: Secondary | ICD-10-CM | POA: Diagnosis present

## 2015-06-24 DIAGNOSIS — I252 Old myocardial infarction: Secondary | ICD-10-CM

## 2015-06-24 DIAGNOSIS — I208 Other forms of angina pectoris: Secondary | ICD-10-CM

## 2015-06-24 DIAGNOSIS — I25119 Atherosclerotic heart disease of native coronary artery with unspecified angina pectoris: Secondary | ICD-10-CM | POA: Diagnosis present

## 2015-06-24 DIAGNOSIS — Z7951 Long term (current) use of inhaled steroids: Secondary | ICD-10-CM | POA: Diagnosis not present

## 2015-06-24 DIAGNOSIS — I130 Hypertensive heart and chronic kidney disease with heart failure and stage 1 through stage 4 chronic kidney disease, or unspecified chronic kidney disease: Secondary | ICD-10-CM | POA: Diagnosis present

## 2015-06-24 DIAGNOSIS — G4730 Sleep apnea, unspecified: Secondary | ICD-10-CM | POA: Diagnosis present

## 2015-06-24 DIAGNOSIS — N183 Chronic kidney disease, stage 3 (moderate): Secondary | ICD-10-CM | POA: Diagnosis present

## 2015-06-24 DIAGNOSIS — I5032 Chronic diastolic (congestive) heart failure: Secondary | ICD-10-CM | POA: Diagnosis present

## 2015-06-24 DIAGNOSIS — I25718 Atherosclerosis of autologous vein coronary artery bypass graft(s) with other forms of angina pectoris: Secondary | ICD-10-CM | POA: Diagnosis not present

## 2015-06-24 DIAGNOSIS — I771 Stricture of artery: Secondary | ICD-10-CM

## 2015-06-24 DIAGNOSIS — I10 Essential (primary) hypertension: Secondary | ICD-10-CM | POA: Diagnosis present

## 2015-06-24 DIAGNOSIS — Z91041 Radiographic dye allergy status: Secondary | ICD-10-CM | POA: Diagnosis not present

## 2015-06-24 DIAGNOSIS — Z8679 Personal history of other diseases of the circulatory system: Secondary | ICD-10-CM

## 2015-06-24 DIAGNOSIS — I25118 Atherosclerotic heart disease of native coronary artery with other forms of angina pectoris: Secondary | ICD-10-CM

## 2015-06-24 DIAGNOSIS — E785 Hyperlipidemia, unspecified: Secondary | ICD-10-CM | POA: Diagnosis present

## 2015-06-24 DIAGNOSIS — Z955 Presence of coronary angioplasty implant and graft: Secondary | ICD-10-CM

## 2015-06-24 DIAGNOSIS — Z9889 Other specified postprocedural states: Secondary | ICD-10-CM

## 2015-06-24 DIAGNOSIS — Z7982 Long term (current) use of aspirin: Secondary | ICD-10-CM | POA: Diagnosis not present

## 2015-06-24 DIAGNOSIS — I714 Abdominal aortic aneurysm, without rupture: Secondary | ICD-10-CM | POA: Diagnosis present

## 2015-06-24 DIAGNOSIS — Z7984 Long term (current) use of oral hypoglycemic drugs: Secondary | ICD-10-CM | POA: Diagnosis not present

## 2015-06-24 DIAGNOSIS — G458 Other transient cerebral ischemic attacks and related syndromes: Secondary | ICD-10-CM | POA: Diagnosis present

## 2015-06-24 DIAGNOSIS — I25110 Atherosclerotic heart disease of native coronary artery with unstable angina pectoris: Secondary | ICD-10-CM | POA: Diagnosis present

## 2015-06-24 DIAGNOSIS — I2089 Other forms of angina pectoris: Secondary | ICD-10-CM

## 2015-06-24 DIAGNOSIS — I129 Hypertensive chronic kidney disease with stage 1 through stage 4 chronic kidney disease, or unspecified chronic kidney disease: Secondary | ICD-10-CM | POA: Diagnosis present

## 2015-06-24 DIAGNOSIS — I2582 Chronic total occlusion of coronary artery: Secondary | ICD-10-CM | POA: Diagnosis present

## 2015-06-24 HISTORY — DX: Dorsalgia, unspecified: M54.9

## 2015-06-24 HISTORY — DX: Atherosclerotic heart disease of native coronary artery without angina pectoris: I25.10

## 2015-06-24 HISTORY — DX: Unspecified chronic bronchitis: J42

## 2015-06-24 HISTORY — DX: Stricture of artery: I77.1

## 2015-06-24 HISTORY — PX: PERIPHERAL VASCULAR CATHETERIZATION: SHX172C

## 2015-06-24 HISTORY — DX: Adverse effect of unspecified anesthetic, initial encounter: T41.45XA

## 2015-06-24 HISTORY — DX: Personal history of other medical treatment: Z92.89

## 2015-06-24 HISTORY — DX: Cardiac murmur, unspecified: R01.1

## 2015-06-24 HISTORY — PX: CARDIAC CATHETERIZATION: SHX172

## 2015-06-24 HISTORY — DX: Type 2 diabetes mellitus without complications: E11.9

## 2015-06-24 HISTORY — DX: Other chronic pain: G89.29

## 2015-06-24 HISTORY — DX: Personal history of other diseases of the digestive system: Z87.19

## 2015-06-24 HISTORY — DX: Major depressive disorder, single episode, unspecified: F32.9

## 2015-06-24 HISTORY — DX: Dependence on supplemental oxygen: Z99.81

## 2015-06-24 HISTORY — DX: Depression, unspecified: F32.A

## 2015-06-24 LAB — BASIC METABOLIC PANEL
ANION GAP: 11 (ref 5–15)
BUN: 23 mg/dL — AB (ref 6–20)
CO2: 23 mmol/L (ref 22–32)
Calcium: 8.8 mg/dL — ABNORMAL LOW (ref 8.9–10.3)
Chloride: 103 mmol/L (ref 101–111)
Creatinine, Ser: 1.71 mg/dL — ABNORMAL HIGH (ref 0.44–1.00)
GFR calc Af Amer: 36 mL/min — ABNORMAL LOW (ref >60)
GFR, EST NON AFRICAN AMERICAN: 31 mL/min — AB (ref >60)
Glucose, Bld: 210 mg/dL — ABNORMAL HIGH (ref 65–99)
POTASSIUM: 4.9 mmol/L (ref 3.5–5.1)
SODIUM: 137 mmol/L (ref 135–145)

## 2015-06-24 LAB — GLUCOSE, CAPILLARY
GLUCOSE-CAPILLARY: 196 mg/dL — AB (ref 65–99)
Glucose-Capillary: 182 mg/dL — ABNORMAL HIGH (ref 65–99)
Glucose-Capillary: 156 mg/dL — ABNORMAL HIGH (ref 65–99)
Glucose-Capillary: 190 mg/dL — ABNORMAL HIGH (ref 65–99)

## 2015-06-24 LAB — POCT ACTIVATED CLOTTING TIME
ACTIVATED CLOTTING TIME: 233 s
ACTIVATED CLOTTING TIME: 220 s
ACTIVATED CLOTTING TIME: 202 s
ACTIVATED CLOTTING TIME: 171 s

## 2015-06-24 SURGERY — AORTIC ARCH ANGIOGRAPHY

## 2015-06-24 MED ORDER — ASPIRIN 81 MG PO CHEW
81.0000 mg | CHEWABLE_TABLET | ORAL | Status: AC
  Administered 2015-06-24: 81 mg via ORAL

## 2015-06-24 MED ORDER — FAMOTIDINE IN NACL 20-0.9 MG/50ML-% IV SOLN
INTRAVENOUS | Status: AC
  Filled 2015-06-24: qty 50

## 2015-06-24 MED ORDER — NICOTINE 14 MG/24HR TD PT24
14.0000 mg | MEDICATED_PATCH | Freq: Every day | TRANSDERMAL | Status: DC
  Administered 2015-06-24 – 2015-06-26 (×3): 14 mg via TRANSDERMAL
  Filled 2015-06-24 (×3): qty 1

## 2015-06-24 MED ORDER — PREDNISONE 20 MG PO TABS: 60.0000 mg | ORAL_TABLET | ORAL | Status: DC

## 2015-06-24 MED ORDER — ALPRAZOLAM 0.25 MG PO TABS
0.2500 mg | ORAL_TABLET | Freq: Two times a day (BID) | ORAL | Status: DC | PRN
  Administered 2015-06-24 – 2015-06-26 (×2): 0.25 mg via ORAL
  Filled 2015-06-24 (×2): qty 1

## 2015-06-24 MED ORDER — LIDOCAINE HCL (PF) 1 % IJ SOLN
INTRAMUSCULAR | Status: DC | PRN
  Administered 2015-06-24: 20 mL

## 2015-06-24 MED ORDER — SODIUM CHLORIDE 0.9 % IV SOLN: 250.0000 mL | INTRAVENOUS | Status: DC | PRN

## 2015-06-24 MED ORDER — ONDANSETRON HCL 4 MG/2ML IJ SOLN: 4.0000 mg | Freq: Four times a day (QID) | INTRAMUSCULAR | Status: DC | PRN

## 2015-06-24 MED ORDER — BUDESONIDE-FORMOTEROL FUMARATE 160-4.5 MCG/ACT IN AERO
2.0000 | INHALATION_SPRAY | Freq: Two times a day (BID) | RESPIRATORY_TRACT | Status: DC
  Administered 2015-06-24 – 2015-06-26 (×5): 2 via RESPIRATORY_TRACT
  Filled 2015-06-24: qty 6

## 2015-06-24 MED ORDER — SODIUM CHLORIDE 0.9 % WEIGHT BASED INFUSION: 1.0000 mL/kg/h | INTRAVENOUS | Status: DC

## 2015-06-24 MED ORDER — FAMOTIDINE 20 MG PO TABS: 20.0000 mg | ORAL_TABLET | ORAL | Status: DC

## 2015-06-24 MED ORDER — SODIUM CHLORIDE 0.9 % WEIGHT BASED INFUSION
1.0000 mL/kg/h | INTRAVENOUS | Status: DC
  Administered 2015-06-24: 1 mL/kg/h via INTRAVENOUS

## 2015-06-24 MED ORDER — HEPARIN (PORCINE) IN NACL 2-0.9 UNIT/ML-% IJ SOLN
INTRAMUSCULAR | Status: AC
  Filled 2015-06-24: qty 1000

## 2015-06-24 MED ORDER — FAMOTIDINE IN NACL 20-0.9 MG/50ML-% IV SOLN
20.0000 mg | INTRAVENOUS | Status: AC
  Administered 2015-06-25: 15:00:00 20 mg via INTRAVENOUS
  Filled 2015-06-24: qty 50

## 2015-06-24 MED ORDER — HEPARIN (PORCINE) IN NACL 2-0.9 UNIT/ML-% IJ SOLN
INTRAMUSCULAR | Status: DC | PRN
  Administered 2015-06-24: 09:00:00

## 2015-06-24 MED ORDER — PREDNISONE 50 MG PO TABS
60.0000 mg | ORAL_TABLET | Freq: Once | ORAL | Status: AC
  Administered 2015-06-24: 60 mg via ORAL
  Filled 2015-06-24: qty 1

## 2015-06-24 MED ORDER — SODIUM CHLORIDE 0.9 % IJ SOLN
3.0000 mL | Freq: Two times a day (BID) | INTRAMUSCULAR | Status: DC
  Administered 2015-06-24 – 2015-06-26 (×3): 3 mL via INTRAVENOUS

## 2015-06-24 MED ORDER — ASPIRIN 81 MG PO CHEW
81.0000 mg | CHEWABLE_TABLET | ORAL | Status: AC
  Administered 2015-06-25: 06:00:00 81 mg via ORAL
  Filled 2015-06-24: qty 1

## 2015-06-24 MED ORDER — PREDNISONE 50 MG PO TABS: 60.0000 mg | ORAL_TABLET | Freq: Once | ORAL | Status: DC

## 2015-06-24 MED ORDER — DIPHENHYDRAMINE HCL 50 MG/ML IJ SOLN
25.0000 mg | INTRAMUSCULAR | Status: AC
  Administered 2015-06-25: 25 mg via INTRAVENOUS
  Filled 2015-06-24: qty 1

## 2015-06-24 MED ORDER — PREDNISONE 20 MG PO TABS
60.0000 mg | ORAL_TABLET | ORAL | Status: AC
  Administered 2015-06-24: 20:00:00 60 mg via ORAL

## 2015-06-24 MED ORDER — SODIUM CHLORIDE 0.9 % WEIGHT BASED INFUSION
3.0000 mL/kg/h | INTRAVENOUS | Status: AC
  Administered 2015-06-25: 3 mL/kg/h via INTRAVENOUS

## 2015-06-24 MED ORDER — IODIXANOL 320 MG/ML IV SOLN
INTRAVENOUS | Status: DC | PRN
  Administered 2015-06-24: 110 mL via INTRAVENOUS

## 2015-06-24 MED ORDER — SODIUM CHLORIDE 0.9 % IJ SOLN: 3.0000 mL | Freq: Two times a day (BID) | INTRAMUSCULAR | Status: DC

## 2015-06-24 MED ORDER — SODIUM CHLORIDE 0.9 % IJ SOLN
3.0000 mL | Freq: Two times a day (BID) | INTRAMUSCULAR | Status: DC
  Administered 2015-06-24 (×2): 3 mL via INTRAVENOUS

## 2015-06-24 MED ORDER — SODIUM CHLORIDE 0.9 % IJ SOLN: 3.0000 mL | INTRAMUSCULAR | Status: DC | PRN

## 2015-06-24 MED ORDER — HEPARIN SODIUM (PORCINE) 1000 UNIT/ML IJ SOLN
INTRAMUSCULAR | Status: DC | PRN
  Administered 2015-06-24: 7000 [IU] via INTRAVENOUS

## 2015-06-24 MED ORDER — ACETAMINOPHEN 325 MG PO TABS
650.0000 mg | ORAL_TABLET | ORAL | Status: DC | PRN
  Administered 2015-06-25: 650 mg via ORAL
  Filled 2015-06-24: qty 2

## 2015-06-24 MED ORDER — ALBUTEROL SULFATE HFA 108 (90 BASE) MCG/ACT IN AERS: 2.0000 | INHALATION_SPRAY | RESPIRATORY_TRACT | Status: DC | PRN

## 2015-06-24 MED ORDER — SODIUM CHLORIDE 0.9 % WEIGHT BASED INFUSION
3.0000 mL/kg/h | INTRAVENOUS | Status: DC
  Administered 2015-06-24: 3 mL/kg/h via INTRAVENOUS

## 2015-06-24 MED ORDER — CLONIDINE HCL 0.1 MG PO TABS
0.2000 mg | ORAL_TABLET | Freq: Two times a day (BID) | ORAL | Status: DC
  Administered 2015-06-24 – 2015-06-26 (×5): 0.2 mg via ORAL
  Filled 2015-06-24 (×7): qty 2

## 2015-06-24 MED ORDER — ASPIRIN 81 MG PO CHEW
CHEWABLE_TABLET | ORAL | Status: AC
  Administered 2015-06-24: 81 mg via ORAL
  Filled 2015-06-24: qty 1

## 2015-06-24 MED ORDER — CARVEDILOL 12.5 MG PO TABS
25.0000 mg | ORAL_TABLET | Freq: Two times a day (BID) | ORAL | Status: DC
  Administered 2015-06-24 – 2015-06-26 (×4): 25 mg via ORAL
  Filled 2015-06-24 (×5): qty 2

## 2015-06-24 MED ORDER — ALBUTEROL SULFATE (2.5 MG/3ML) 0.083% IN NEBU: 2.5000 mg | INHALATION_SOLUTION | RESPIRATORY_TRACT | Status: DC | PRN

## 2015-06-24 MED ORDER — PREDNISONE 10 MG PO TABS: 60.0000 mg | ORAL_TABLET | ORAL | Status: DC

## 2015-06-24 MED ORDER — FENTANYL CITRATE (PF) 100 MCG/2ML IJ SOLN
INTRAMUSCULAR | Status: DC | PRN
  Administered 2015-06-24 (×2): 25 ug via INTRAVENOUS

## 2015-06-24 MED ORDER — DOCUSATE SODIUM 100 MG PO CAPS
100.0000 mg | ORAL_CAPSULE | Freq: Every day | ORAL | Status: DC
  Administered 2015-06-25 – 2015-06-26 (×2): 100 mg via ORAL
  Filled 2015-06-24 (×2): qty 1

## 2015-06-24 MED ORDER — AMLODIPINE BESYLATE 5 MG PO TABS
10.0000 mg | ORAL_TABLET | Freq: Every day | ORAL | Status: DC
  Administered 2015-06-25 – 2015-06-26 (×2): 10 mg via ORAL
  Filled 2015-06-24 (×3): qty 2

## 2015-06-24 MED ORDER — CLOPIDOGREL BISULFATE 75 MG PO TABS
75.0000 mg | ORAL_TABLET | Freq: Every day | ORAL | Status: DC
  Administered 2015-06-25 – 2015-06-26 (×2): 75 mg via ORAL
  Filled 2015-06-24 (×2): qty 1

## 2015-06-24 MED ORDER — DIPHENHYDRAMINE HCL 50 MG/ML IJ SOLN
25.0000 mg | INTRAMUSCULAR | Status: AC
  Administered 2015-06-24: 25 mg via INTRAVENOUS

## 2015-06-24 MED ORDER — GUAIFENESIN-DM 100-10 MG/5ML PO SYRP: 15.0000 mL | ORAL_SOLUTION | ORAL | Status: DC | PRN

## 2015-06-24 MED ORDER — SODIUM CHLORIDE 0.9 % IV SOLN
250.0000 mL | INTRAVENOUS | Status: DC | PRN
Start: 2015-06-24 — End: 2015-06-25

## 2015-06-24 MED ORDER — HYDRALAZINE HCL 50 MG PO TABS
100.0000 mg | ORAL_TABLET | Freq: Three times a day (TID) | ORAL | Status: DC
  Administered 2015-06-24 – 2015-06-26 (×7): 100 mg via ORAL
  Filled 2015-06-24 (×9): qty 2

## 2015-06-24 MED ORDER — FENTANYL CITRATE (PF) 100 MCG/2ML IJ SOLN
INTRAMUSCULAR | Status: AC
  Filled 2015-06-24: qty 4

## 2015-06-24 MED ORDER — PANTOPRAZOLE SODIUM 40 MG PO TBEC
40.0000 mg | DELAYED_RELEASE_TABLET | Freq: Every day | ORAL | Status: DC
  Administered 2015-06-24 – 2015-06-26 (×3): 40 mg via ORAL
  Filled 2015-06-24 (×3): qty 1

## 2015-06-24 MED ORDER — CLOPIDOGREL BISULFATE 300 MG PO TABS
ORAL_TABLET | ORAL | Status: DC | PRN
  Administered 2015-06-24: 600 mg via ORAL

## 2015-06-24 MED ORDER — LABETALOL HCL 5 MG/ML IV SOLN: 10.0000 mg | INTRAVENOUS | Status: DC | PRN

## 2015-06-24 MED ORDER — ALUM & MAG HYDROXIDE-SIMETH 200-200-20 MG/5ML PO SUSP: 15.0000 mL | ORAL | Status: DC | PRN

## 2015-06-24 MED ORDER — SODIUM CHLORIDE 0.9 % WEIGHT BASED INFUSION: 3.0000 mL/kg/h | INTRAVENOUS | Status: AC

## 2015-06-24 MED ORDER — FAMOTIDINE IN NACL 20-0.9 MG/50ML-% IV SOLN
INTRAVENOUS | Status: AC
  Administered 2015-06-24: 20 mg
  Filled 2015-06-24: qty 50

## 2015-06-24 MED ORDER — CLOPIDOGREL BISULFATE 300 MG PO TABS
ORAL_TABLET | ORAL | Status: AC
  Filled 2015-06-24: qty 2

## 2015-06-24 MED ORDER — MIDAZOLAM HCL 2 MG/2ML IJ SOLN
INTRAMUSCULAR | Status: AC
  Filled 2015-06-24: qty 4

## 2015-06-24 MED ORDER — LIDOCAINE HCL (PF) 1 % IJ SOLN
INTRAMUSCULAR | Status: AC
  Filled 2015-06-24: qty 30

## 2015-06-24 MED ORDER — HYDRALAZINE HCL 20 MG/ML IJ SOLN: 5.0000 mg | INTRAMUSCULAR | Status: DC | PRN

## 2015-06-24 MED ORDER — MIDAZOLAM HCL 2 MG/2ML IJ SOLN
INTRAMUSCULAR | Status: DC | PRN
  Administered 2015-06-24 (×2): 1 mg via INTRAVENOUS

## 2015-06-24 MED ORDER — PREDNISONE 20 MG PO TABS
60.0000 mg | ORAL_TABLET | ORAL | Status: AC
  Administered 2015-06-25: 60 mg via ORAL
  Filled 2015-06-24 (×2): qty 3

## 2015-06-24 MED ORDER — GLIPIZIDE ER 5 MG PO TB24
5.0000 mg | ORAL_TABLET | Freq: Every day | ORAL | Status: DC
  Administered 2015-06-26: 08:00:00 5 mg via ORAL
  Filled 2015-06-24 (×3): qty 1

## 2015-06-24 MED ORDER — METOPROLOL TARTRATE 1 MG/ML IV SOLN: 2.0000 mg | INTRAVENOUS | Status: DC | PRN

## 2015-06-24 MED ORDER — IODIXANOL 320 MG/ML IV SOLN
INTRAVENOUS | Status: DC | PRN
  Administered 2015-06-24: 80 mL via INTRA_ARTERIAL

## 2015-06-24 MED ORDER — SODIUM CHLORIDE 0.9 % IV SOLN: INTRAVENOUS | Status: DC

## 2015-06-24 MED ORDER — PHENOL 1.4 % MT LIQD
1.0000 | OROMUCOSAL | Status: DC | PRN
  Filled 2015-06-24: qty 177

## 2015-06-24 MED ORDER — HEPARIN SODIUM (PORCINE) 1000 UNIT/ML IJ SOLN
INTRAMUSCULAR | Status: AC
  Filled 2015-06-24: qty 1

## 2015-06-24 MED ORDER — FAMOTIDINE IN NACL 20-0.9 MG/50ML-% IV SOLN
INTRAVENOUS | Status: DC | PRN
  Administered 2015-06-24: 20 mg via INTRAVENOUS

## 2015-06-24 MED ORDER — DIPHENHYDRAMINE HCL 50 MG/ML IJ SOLN
INTRAMUSCULAR | Status: AC
  Administered 2015-06-24: 25 mg via INTRAVENOUS
  Filled 2015-06-24: qty 1

## 2015-06-24 SURGICAL SUPPLY — 25 items
CATH ANGIO 5F BER2 100CM (CATHETERS) ×4 IMPLANT
CATH ANGIO 5F BER2 65CM (CATHETERS) ×4 IMPLANT
CATH EXPO 5F MPA-1 (CATHETERS) ×4 IMPLANT
CATH INFINITI 5 FR LCB (CATHETERS) ×4 IMPLANT
CATH INFINITI 5FR MULTPACK ANG (CATHETERS) ×4 IMPLANT
DEVICE TORQUE H2O (MISCELLANEOUS) ×4 IMPLANT
GUIDEWIRE ANGLED .035X150CM (WIRE) ×4 IMPLANT
KIT ENCORE 26 ADVANTAGE (KITS) ×4 IMPLANT
KIT MICROINTRODUCER STIFF 5F (SHEATH) ×4 IMPLANT
KIT PV (KITS) ×4 IMPLANT
KIT SINGLE-Y CONNECTOR (CONNECTOR) ×4 IMPLANT
SHEATH PINNACLE 5F 10CM (SHEATH) ×4 IMPLANT
SHEATH PINNACLE 7F 10CM (SHEATH) ×4 IMPLANT
SHEATH SHUTTLE 7FR (SHEATH) ×4 IMPLANT
SHIELD RADPAD SCOOP 12X17 (MISCELLANEOUS) ×4 IMPLANT
STENT GENESIS OPTA 8X18X80 (Permanent Stent) ×4 IMPLANT
STOPCOCK MORSE 400PSI 3WAY (MISCELLANEOUS) ×4 IMPLANT
SYR MEDRAD MARK V 150ML (SYRINGE) ×4 IMPLANT
TRANSDUCER W/STOPCOCK (MISCELLANEOUS) ×4 IMPLANT
TRAY PV CATH (CUSTOM PROCEDURE TRAY) ×4 IMPLANT
WIRE BENTSON .035X145CM (WIRE) ×4 IMPLANT
WIRE HI TORQ VERSACORE J 260CM (WIRE) ×4 IMPLANT
WIRE J 3MM .035X145CM (WIRE) ×4 IMPLANT
WIRE ROSEN-J .035X180CM (WIRE) ×4 IMPLANT
WIRE SPARTACORE .014X300CM (WIRE) ×4 IMPLANT

## 2015-06-24 NOTE — Interval H&P Note (Signed)
History and Physical Interval Note:  06/24/2015 7:14 AM  [name]  has presented today for surgery, with the diagnosis of left subclavian steal  The various methods of treatment have been discussed with the patient and family. After consideration of risks, benefits and other options for treatment, the patient has consented to  Procedure(s): Aortic Arch Angiography (N/A) Left Heart Cath and Coronary Angiography (N/A) as a surgical intervention .  The patient's history has been reviewed, patient examined, no change in status, stable for surgery.  I have reviewed the patient's chart and labs.  Questions were answered to the patient's satisfaction.     [name]

## 2015-06-24 NOTE — Op Note (Signed)
    Patient name: [name] MRN: [id number] DOB: 13-Dec-1950 Sex: female  06/24/2015 Pre-operative Diagnosis: Left subclavian stenosis Post-operative diagnosis:  Same Surgeon:  [name] Procedure Performed:  1.  Ultrasound-guided access, left femoral artery  2.  Abdominal aortogram  3.  Aortic arch angiogram  4.  Left subclavian artery stent    Indications:  The patient has a LIMA to LAD graft and has had some cardiac issues.  There is concern over subclavian stenosis.  She is here for evaluation and possible intervention  Findings: 80% left subclavian artery stenosis, successfully treated using an 8 x 18 balloon expandable stent with residual stenosis less than 10%  Procedure:  The patient was identified in the holding area and taken to room 8.  The patient was then placed supine on the table and prepped and draped in the usual sterile fashion.  A time out was called.  Ultrasound was used to evaluate the left common femoral artery.  It was patent .  A digital ultrasound image was acquired.  A micropuncture needle was used to access the left common femoral artery under ultrasound guidance.  An 018 wire was advanced without resistance and a micropuncture sheath was placed.  The 018 wire was removed and a benson wire was placed.  The micropuncture sheath was exchanged for a 5 french sheath.  I then used a Berenstein 2 catheter and a Glidewire to navigate through the stenosis within the left common iliac artery which was identified by retrograde injection through the catheter which was in the left common iliac artery.  I then advanced a pigtail catheter in the ascending aorta and an aortic arch aneurysm was performed.  Next using a JR4 catheter and a versa core wire, the catheter was placed into the left subclavian artery and left subclavian artery images were obtained.  I placed a 018 wire and performed additional images.   Findings:   Abdominal Aortogram:  Infrarenal abdominal aortic  aneurysm is identified.  The true dimensions of this cannot be determined based on these images.  A hemodynamically significant left common iliac artery stenosis is identified.  Aortic arch: A type I aortic arch is identified.  No significant ostial stenosis is identified in the innominate or left common carotid origin.  There is a stenosis visualized within the proximal left subclavian artery.  Left subclavian:  Selected images the left through artery were obtained.  There is approximately an 80% stenosis near the ostium.  The remaining portion of the subclavian artery is widely patent.  Early opacification of the LIMA graft was visualized  Intervention:  After the above images were acquired, the decision was made to proceed with intervention.  Over35 wire, a 7 French sheath 90 cm was inserted into the left subclavian artery.  The patient was fully heparinized.  I selected a 8 x 18 balloon expandable stent and deployed this beginning at the origin of the left subclavian artery.  The balloon was taken to nominal pressure.  Completion imaging revealed resolution of the stenosis.  At this point, I removed the long 7 French sheath and placed a short 7 French sheath so that cardiology to perform the coronary catheterization.  Impression:  #1  80% left subclavian artery stenosis successfully treated using a 8 x 18 balloon expandable stent with residual stenosis less than 10%  #2  type I aortic arch     Theotis Burrow, M.D. [facility] Office: [phone] Pager:  [phone]

## 2015-06-24 NOTE — Progress Notes (Signed)
Site area: left groin   Site Prior to Removal:  Level 0  Pressure Applied For 20 MINUTES    Minutes Beginning at 1450  Manual:   Yes.    Patient Status During Pull:  AAO x4  Post Pull Groin Site:  Level 0  Post Pull Instructions Given:  Yes.    Post Pull Pulses Present:  Yes.    Dressing Applied:  Yes.    Comments:  Tolerated procedure well

## 2015-06-24 NOTE — Interval H&P Note (Signed)
History and Physical Interval Note:  06/24/2015 7:47 AM  [name]  has presented today for surgery, with the diagnosis of left subclavian steal & Rest Angina.   The various methods of treatment have been discussed with the patient and family. After consideration of risks, benefits and other options for treatment, the patient has consented to  Procedure(s): Aortic Arch Angiography (N/A) Left Heart Cath and Coronary Angiography (N/A) as a surgical intervention .  The patient's history has been reviewed, patient examined, no change in status, stable for surgery.  I have reviewed the patient's chart and labs.  Questions were answered to the patient's satisfaction.     Cath Lab Visit (complete for each Cath Lab visit)  Clinical Evaluation Leading to the Procedure:   ACS: No.  Non-ACS:    Anginal Classification: CCS IV  Anti-ischemic medical therapy: Maximal Therapy (2 or more classes of medications)  Non-Invasive Test Results: No non-invasive testing performed  Prior CABG: Previous CABG   HARDING, DAVID W  AUC Criteria cannot be calculated by SCAI Calculator due to Prior CABG.  Leonie Man, MD

## 2015-06-24 NOTE — Progress Notes (Signed)
UR COMPLETED  

## 2015-06-24 NOTE — H&P (View-Only) (Signed)
Cardiology Office Note Date:  06/19/2015   ID:  [name], DOB 03-25-1951, MRN [id number]  PCP:  Phineas Inches, MD  Cardiologist:  Sherren Mocha, MD    Chief Complaint  Patient presents with  . Shortness of Breath   History of Present Illness: [name] is a 64 y.o. female who presents for follow-up evaluation.  The patient has been followed for coronary artery disease and mitral valve disease. In 2012 she presented with non-ST elevation MI and was found to have left main and severe three-vessel disease as well as severe mitral regurgitation. She underwent CABG with mitral valve repair during that admission. She also had a CryoCox-Maze procedure with left atrial appendage clipping. FU postoperative echocardiogram in December 2012 showed normal LV size and systolic function. The patient's mitral annuloplasty ring was well seated with mild mitral stenosis and trivial regurgitation. Pulmonary artery pressure was normal. There was mild left atrial dilatation.   She has extensive vascular disease and has undergone multiple renal artery stent and PTA procedures, carotid endarterectomy (2009), and known left subclavian stenosis.   She was hospitalized at [facility] in August with a ruptured appendix and had laprascopic appendectomy. Also was noted to have a pelvic abscess. Post-operatively she had elevated troponin values and was seen by cardiology. It was recommended that she undergo treatment of left subclavian stenosis since she's had LIMA-LAD grafting. She presents today for further discussion.   She admits to symptoms of palpitations, chest pain, and shortness of breath. Denies orthopnea or PND but states that her weight is up a few pounds. She has substernal pressure associated with palpitations, non-radiating pain. This feels similar to her symptoms prior to CABG.  Past Medical History  Diagnosis Date  . Coronary artery disease 5/09    s/p CABG 7/12   L-LAD, S-OM,  S-PDA  . PAD (peripheral artery disease) (HCC)     Severe; s/p bilateral renal artery stents, moderate in-stent restenosis  . AAA (abdominal aortic aneurysm) ([place]) 5/09    3.7x3.3 by u/s 2009  . carotid stenosis 5/09    S/p L CEA ;  60-79% bilat ICA by preCABG dopplers 7/12  . Hypertension   . Hyperlipidemia   . COPD (chronic obstructive pulmonary disease) ([place])   . GERD (gastroesophageal reflux disease)     With hiatal hernia  . DM2 (diabetes mellitus, type 2) ([place])   . Paroxysmal atrial fibrillation (HCC)     coumadin;  echo 9/07: EF 60%, mild LVH;  s/p Cox Maze 7/12 with LAA clipping  . GIB (gastrointestinal bleeding) 9/11    S/p EGD with cautery at [facility]  . Sleep apnea   . Abnormal EKG     deep TW inversions chronic  . CKD (chronic kidney disease)   . Mitral regurgitation     3+ MR by intraoperative TEE;  s/p MV repair with Dr. Roxy Manns 7/12  . Myocardial infarction ([place])   . Anemia   . CHF (congestive heart failure) [facility])     Past Surgical History  Procedure Laterality Date  . Carotid endarterectomy      Left  . Abdominal hysterectomy    . Hand surgery      RIGHT WRIST  . Right femoral-popliteal bypass    . Coronary artery bypass graft  03/05/2011    CABG x3 (LIMA to LAD, SVG to OM, SVG to PDA, EVH via left thigh  . Mitral valve repair  03/05/2011    65mm Memo 3D ring  annuloplasty for ischemic MR  . Maze  03/05/2011    complete biatrial lesion set with clipping of LA appendage  . Colon bowl resection  2013  . Renal angiogram Bilateral 09/11/2013    Procedure: RENAL ANGIOGRAM;  Surgeon: Serafina Mitchell, MD;  Location: [facility] CATH LAB;  Service: Cardiovascular;  Laterality: Bilateral;  . Appendectomy  Aug. 11, 2016    Ruptured    Current Outpatient Prescriptions  Medication Sig Dispense Refill  . budesonide-formoterol (SYMBICORT) 160-4.5 MCG/ACT inhaler Inhale into the lungs.    . predniSONE (DELTASONE) 10 MG tablet 3 tabs daily x 3 days, then 2 tabs daily x 3 days, then one  tab daily x 3 days.    Marland Kitchen albuterol (PROVENTIL HFA;VENTOLIN HFA) 108 (90 BASE) MCG/ACT inhaler Inhale 2 puffs into the lungs every 4 (four) hours as needed for wheezing or shortness of breath. ProAir    . amLODipine (NORVASC) 10 MG tablet Take 10 mg by mouth daily.      Marland Kitchen aspirin 325 MG tablet Take 325 mg by mouth daily.    . Aspirin-Caffeine 845-65 MG PACK Take 1 packet by mouth daily as needed (aches and pains).    . carvedilol (COREG) 25 MG tablet Take 1 tablet (25 mg total) by mouth 2 (two) times daily with a meal. 60 tablet 11  . cholecalciferol (VITAMIN D) 1000 UNITS tablet Take 2,000 Units by mouth daily.    . cloNIDine (CATAPRES) 0.2 MG tablet Take 0.2 mg by mouth 2 (two) times daily.    . fenofibrate micronized (LOFIBRA) 134 MG capsule Take 134 mg by mouth at bedtime.      . ferrous gluconate (FERGON) 325 MG tablet Take 325 mg by mouth 2 (two) times daily.     . furosemide (LASIX) 20 MG tablet Take 1 tablet (20 mg total) by mouth daily. 30 tablet 6  . glipiZIDE (GLUCOTROL XL) 5 MG 24 hr tablet Take 5 mg by mouth daily with breakfast.    . hydrALAZINE (APRESOLINE) 100 MG tablet TAKE 1 TABLET BY MOUTH THREE TIMES DAILY 90 tablet 0  . HYDROcodone-acetaminophen (NORCO/VICODIN) 5-325 MG per tablet Take 1 tablet by mouth 3 (three) times daily.     . Multiple Vitamin (MULTIVITAMIN) tablet Take 1 tablet by mouth daily. Hair, skin and nails formulation    . omeprazole (PRILOSEC) 20 MG capsule Take 20 mg by mouth 2 (two) times daily.      . pravastatin (PRAVACHOL) 80 MG tablet TAKE 1 TABLET BY MOUTH DAILY 30 tablet 0  . traZODone (DESYREL) 50 MG tablet Take 100 mg by mouth at bedtime.     Marland Kitchen zolpidem (AMBIEN) 10 MG tablet Take 10 mg by mouth at bedtime.     No current facility-administered medications for this visit.    Allergies:   Codeine; Contrast media; Imdur; Percocet; Ioxaglate; and Metrizamide   Social History:  The patient  reports that she has been smoking Cigarettes.  She has a 42  pack-year smoking history. She has never used smokeless tobacco. She reports that she drinks about 3.0 oz of alcohol per week. She reports that she does not use illicit drugs.   Family History:  The patient's  family history includes AAA (abdominal aortic aneurysm) in her mother; Diabetes in her brother; Emphysema in her father and mother; Heart attack in her brother and mother; Heart attack (age of onset: 49) in her father; Heart disease in her brother, father, mother, and other; Hyperlipidemia in her brother, father, and  mother; Hypertension in her brother, father, and mother; Peripheral vascular disease in her father.    ROS:  Please see the history of present illness.  Otherwise, review of systems is positive for weight gain, cough, exertional dyspnea, depression, fatigue, wheezing, and anxiety.  All other systems are reviewed and negative.    PHYSICAL EXAM: VS:  BP 130/62 mmHg  Pulse 58  Ht 5\' 5"  (1.651 m)  Wt 151 lb (68.493 kg)  BMI 25.13 kg/m2  SpO2 98% , BMI Body mass index is 25.13 kg/(m^2). GEN: Well nourished, well developed, in no acute distress HEENT: normal Neck: no JVD, no masses. Bilateral bruits Cardiac: RRR with 2/6 harsh systolic murmur at the RUSB/LUSB              Respiratory:  clear to auscultation bilaterally, normal work of breathing GI: soft, nontender, nondistended, + BS MS: no deformity or atrophy Ext: no pretibial edema, diminished pulse on the right brachial/radial Skin: warm and dry, no rash Neuro:  Strength and sensation are intact Psych: euthymic mood, full affect  EKG:  EKG is not ordered today.  Recent Labs: 06/17/2015: BUN 20; Creat 1.32*; Hemoglobin 11.8*; Platelets 309; Potassium 5.5*; Sodium 140   Lipid Panel     Component Value Date/Time   CHOL 144 03/05/2011 0446   TRIG 346* 03/05/2011 0446   HDL 17* 03/05/2011 0446   CHOLHDL 8.5 03/05/2011 0446   VLDL 69* 03/05/2011 0446   LDLCALC 58 03/05/2011 0446   LDLDIRECT 78.9 05/07/2008 1301       Wt Readings from Last 3 Encounters:  06/17/15 151 lb (68.493 kg)  06/10/15 147 lb (66.679 kg)  02/24/15 148 lb (67.132 kg)     Cardiac Studies Reviewed: CXR 06-11-15 (Care Everywhere) IMPRESSION: Post CABG and MVR.  No acute abnormalities.  2D Echo: 07-06-2015: Study Conclusions  - Left ventricle: The cavity size was normal. Wall thickness was normal. Systolic function was normal. The estimated ejection fraction was in the range of 55% to 60%. Wall motion was normal; there were no regional wall motion abnormalities. - Mitral valve: The mean gradient is 34mmHg with pressure half time valve area 1.62cm squared. The findings are consistent with mild to moderate stenosis. Mean gradient (D): 9 mm Hg. Peak gradient (D): 23 mm Hg. Valve area by pressure half-time: 1.62 cm^2. Valve area by continuity equation (using LVOT flow): 1.02 cm^2. - Left atrium: The atrium was mildly dilated. - Right ventricle: The cavity size was mildly dilated. Wall thickness was normal. - Pulmonary arteries: Systolic pressure was moderately increased. PA peak pressure: 51 mm Hg (S).  Impressions:  - Compared to the prior study, there has been no significant interval change.  ASSESSMENT AND PLAN: 1.  CAD, native vessel, with angina at rest 2. Symptomatic left subclavian stenosis 3. PAD with renal atherosclerosis, carotid and subclavian stenosis 4. Mitral valve disease s/p MV repair 5. PAF: no recurrence since Cryo-Cox Maze at the time of surgery 6. HTN with chronic kidney disease Stage 3 7. Hyperlipidemia 8. Tobacco abuse  Pt for upcoming left subclavian angiography and stenting for treatment of severe subclavian stenosis in the setting of a patent LIMA graft. Considering her anginal symptoms, I think it is best not to assume all of her symptoms are related to ischemia in the LIMA/LAD distribution. I have recommended cardiac catheterization and possible PCI at the time of  subclavian angiography. I have discussed her case with Dr Trula Slade and we will coordinate cardiac cath to be done at  the same time as subclavian angiography. I have reviewed the risks, indications, and alternatives to cardiac catheterization and possible PCI with the patient. Risks include but are not limited to bleeding, infection, vascular injury, stroke, myocardial infection, arrhythmia, kidney injury, radiation-related injury in the case of prolonged fluoroscopy use, emergency cardiac surgery, and death. The patient understands the risks of serious complication is low (123456).  In the meantime, her current medications will be continued. Labs will be drawn today.  Current medicines are reviewed with the patient today.  The patient does not have concerns regarding medicines.  Labs/ tests ordered today include:   Orders Placed This Encounter  Procedures  . Basic Metabolic Panel (BMET)  . CBC  . INR/PT    Disposition:   FU pending cath results  Signed, Sherren Mocha, MD  06/19/2015 6:47 AM    [facility] [address], [place], [place]  [address] Phone: [phone]; Fax: [phone]

## 2015-06-24 NOTE — H&P (View-Only) (Signed)
Subjective:     Patient ID: [name], female   DOB: Nov 18, 1950, 64 y.o.   MRN: [id number] .HPI 64 year old female returns for further follow-up regarding her left subclavian occlusive disease. She has known left subclavian stenosis with retrograde flow in left vertebral artery but at last visit despite some left arm claudication symptoms she did not want to proceed with stenting or surgery. Patient has had an extensive cardiac procedure performed by Dr. Roxy Manns in 2011 with coronary artery bypass grafting and maze procedure with valve replacement. Patient had urgent appendectomy performed at the [facility] in August 2016 and postoperatively apparently had positive cardiac enzymes. It was not felt that she had a myocardial infarction. She is not having chest pain at the current time. She is on daily aspirin. She continues to have easy fatigability of her left upper extremity with any activity particularly when she is doing something with her arms over her head. She does have some dizziness and blurred vision on occasion but no syncope. She denies that her lysing weakness, aphasia, amaurosis fugax, or syncope. She now feels that the left subclavian artery needs to be addressed because the cardiologist at the [facility] stated that this could be a initiating factor. Patient did have LIMA graft to her left anterior descending coronary artery at the time of her cardiac procedure.  Past Medical History  Diagnosis Date  . Coronary artery disease 5/09    s/p CABG 7/12   L-LAD, S-OM, S-PDA  . PAD (peripheral artery disease) (HCC)     Severe; s/p bilateral renal artery stents, moderate in-stent restenosis  . AAA (abdominal aortic aneurysm) ([place]) 5/09    3.7x3.3 by u/s 2009  . carotid stenosis 5/09    S/p L CEA ;  60-79% bilat ICA by preCABG dopplers 7/12  . Hypertension   . Hyperlipidemia   . COPD (chronic obstructive pulmonary disease) ([place])   . GERD (gastroesophageal reflux disease)     With hiatal hernia  . DM2  (diabetes mellitus, type 2) ([place])   . Paroxysmal atrial fibrillation (HCC)     coumadin;  echo 9/07: EF 60%, mild LVH;  s/p Cox Maze 7/12 with LAA clipping  . GIB (gastrointestinal bleeding) 9/11    S/p EGD with cautery at [facility]  . Sleep apnea   . Abnormal EKG     deep TW inversions chronic  . CKD (chronic kidney disease)   . Mitral regurgitation     3+ MR by intraoperative TEE;  s/p MV repair with Dr. Roxy Manns 7/12  . Myocardial infarction ([place])   . Anemia   . CHF (congestive heart failure) ([place])     Social History  Substance Use Topics  . Smoking status: Current Every Day Smoker -- 1.00 packs/day for 42 years    Types: Cigarettes  . Smokeless tobacco: Never Used  . Alcohol Use: 3.0 oz/week    5 Shots of liquor per week    Family History  Problem Relation Age of Onset  . Emphysema Mother   . Heart disease Mother     before age 89  . Hypertension Mother   . Hyperlipidemia Mother   . Heart attack Mother   . Heart attack Father   . Emphysema Father   . Heart disease Father     before age 57  . Hyperlipidemia Father   . Hypertension Father   . Peripheral vascular disease Father   . Heart disease Other     Vascular disease in grandparents, uncles and  dad  . Diabetes Brother   . Heart disease Brother     before age 66  . Hyperlipidemia Brother   . Hypertension Brother   . Heart attack Brother     Allergies  Allergen Reactions  . Codeine     REACTION: itching  . Contrast Media [Iodinated Diagnostic Agents] Itching    Itching of feet  . Imdur [Isosorbide Dinitrate]     "felt like my insides were going to explode"  . Percocet [Oxycodone-Acetaminophen] Itching    Tolerates Hydrocodone  . Ioxaglate Itching    Itching of feet  . Metrizamide Itching    Itching of feet     Current outpatient prescriptions:  .  amLODipine (NORVASC) 10 MG tablet, Take 10 mg by mouth daily.  , Disp: , Rfl:  .  aspirin 325 MG tablet, Take 325 mg by mouth daily., Disp: , Rfl:  .   Aspirin-Caffeine 845-65 MG PACK, Take 1 packet by mouth daily as needed (aches and pains)., Disp: , Rfl:  .  carvedilol (COREG) 25 MG tablet, Take 1 tablet (25 mg total) by mouth 2 (two) times daily with a meal., Disp: 60 tablet, Rfl: 11 .  cholecalciferol (VITAMIN D) 1000 UNITS tablet, Take 2,000 Units by mouth daily., Disp: , Rfl:  .  cloNIDine (CATAPRES) 0.2 MG tablet, Take 0.2 mg by mouth 2 (two) times daily., Disp: , Rfl:  .  fenofibrate micronized (LOFIBRA) 134 MG capsule, Take 134 mg by mouth at bedtime.  , Disp: , Rfl:  .  ferrous gluconate (FERGON) 325 MG tablet, Take 325 mg by mouth 2 (two) times daily. , Disp: , Rfl:  .  furosemide (LASIX) 20 MG tablet, Take 1 tablet (20 mg total) by mouth daily., Disp: 30 tablet, Rfl: 6 .  glipiZIDE (GLUCOTROL XL) 5 MG 24 hr tablet, Take 5 mg by mouth daily with breakfast., Disp: , Rfl:  .  hydrALAZINE (APRESOLINE) 100 MG tablet, TAKE 1 TABLET BY MOUTH THREE TIMES DAILY, Disp: 90 tablet, Rfl: 0 .  HYDROcodone-acetaminophen (NORCO/VICODIN) 5-325 MG per tablet, Take 1 tablet by mouth 3 (three) times daily. , Disp: , Rfl:  .  omeprazole (PRILOSEC) 20 MG capsule, Take 20 mg by mouth 2 (two) times daily.  , Disp: , Rfl:  .  pravastatin (PRAVACHOL) 80 MG tablet, TAKE 1 TABLET BY MOUTH DAILY, Disp: 30 tablet, Rfl: 0 .  zolpidem (AMBIEN) 10 MG tablet, Take 10 mg by mouth at bedtime., Disp: , Rfl:  .  albuterol (PROVENTIL HFA;VENTOLIN HFA) 108 (90 BASE) MCG/ACT inhaler, Inhale 2 puffs into the lungs every 4 (four) hours as needed for wheezing or shortness of breath. ProAir, Disp: , Rfl:  .  Multiple Vitamin (MULTIVITAMIN) tablet, Take 1 tablet by mouth daily. Hair, skin and nails formulation, Disp: , Rfl:  .  traZODone (DESYREL) 50 MG tablet, Take 100 mg by mouth at bedtime. , Disp: , Rfl:   Filed Vitals:   06/10/15 1057 06/10/15 1100 06/10/15 1103  BP: 207/73 133/74 203/75  Pulse: 62 62 60  Temp:  97.5 F (36.4 C)   TempSrc:  Oral   Resp:  16   Height:   5\' 5"  (1.651 m)   Weight:  147 lb (66.679 kg)   SpO2:  98%     Body mass index is 24.46 kg/(m^2).        Marland Kitchen Review of Systems patient has history of renovascular hypertension and has had stents and bilateral renal arteries. Had angioplasty of in-stent  stenosis performed by Dr. Trula Slade last year bilaterally. Patient has abdominal aortic aneurysm measuring 4.3 cm in maximum diameter. Denies chest pain, dyspnea on exertion, PND, orthopnea, hemoptysis. Has mild calf claudication bilaterally which is stable. Had appendectomy performed 2 months ago urgently. Other systems negative and complete review of systems other than what was noted in history of present illness     Objective:   Physical Exam BP 203/75 mmHg  Pulse 60  Temp(Src) 97.5 F (36.4 C) (Oral)  Resp 16  Ht 5\' 5"  (1.651 m)  Wt 147 lb (66.679 kg)  BMI 24.46 kg/m2  SpO2 98%  Gen.-alert and oriented x3 in no apparent distress HEENT normal for age Lungs no rhonchi or wheezing Cardiovascular regular rhythm no murmurs carotid pulses 3+ palpable-harsh left supraclavicular bruit audible Soft right carotid bruit Abdomen soft nontender no palpable masses Musculoskeletal free of  major deformities Skin clear -no rashes Neurologic normal Lower extremities 3+ femoral pulses bilaterally palpable. No distal pulses palpable. Both feet well perfused. Right upper extremity with 3+ radial pulse palpable. Left upper extremity 1-2+ radial pulse palpable.   Today I ordered a carotid duplex exam which I reviewed and interpreted. There continues to be a mild stenosis at the left carotid endarterectomy site which is unchanged. There continues to be a severe left subclavian stenosis with retrograde flow in the left vertebral artery. There is a 60 mm gradient with right brachial pressures higher than left brachial pressures.       Assessment:     Left subclavian stenosis with retrograde flow left vertebral and possible steal from LIMA graft  to left anterior descending coronary artery Recent appendectomy with positive cardiac enzymes postoperatively-no myocardial infarction History of Cox maze procedure, mitral valve repair, coronary artery bypass grafting in 2012 by Dr. Roxy Manns Abdominal aortic aneurysm 4.3 cm stable History of left carotid endarterectomy by Dr. [name] 2009 with mild restenosis as distal end of patch     Plan:     #1 patient has appointment to see Dr. Burt Knack regarding her cardiac status October 13 We'll schedule arch aortogram by Dr. Trula Slade with possible PTA or stenting left subclavian artery if felt to be low risk and straightforward since patient has LIMA graft to LAD #3 if patient is operative candidate and not able to be treated with percutaneous approach-she will need left carotid-subclavian bypass graft Discussed this with patient and she is agreeable to proceed.

## 2015-06-25 ENCOUNTER — Telehealth: Payer: Self-pay | Admitting: Vascular Surgery

## 2015-06-25 ENCOUNTER — Encounter (HOSPITAL_COMMUNITY)
Admission: AD | Disposition: A | Discharge status: Home / Self Care | Payer: Self-pay | Source: Ambulatory Visit | Attending: Cardiology

## 2015-06-25 DIAGNOSIS — I25718 Atherosclerosis of autologous vein coronary artery bypass graft(s) with other forms of angina pectoris: Secondary | ICD-10-CM

## 2015-06-25 DIAGNOSIS — I208 Other forms of angina pectoris: Secondary | ICD-10-CM

## 2015-06-25 HISTORY — PX: CARDIAC CATHETERIZATION: SHX172

## 2015-06-25 LAB — BASIC METABOLIC PANEL
Anion gap: 11 (ref 5–15)
BUN: 28 mg/dL — AB (ref 6–20)
CHLORIDE: 107 mmol/L (ref 101–111)
CO2: 22 mmol/L (ref 22–32)
Calcium: 9.1 mg/dL (ref 8.9–10.3)
Creatinine, Ser: 1.71 mg/dL — ABNORMAL HIGH (ref 0.44–1.00)
GFR calc Af Amer: 36 mL/min — ABNORMAL LOW (ref >60)
GFR calc non Af Amer: 31 mL/min — ABNORMAL LOW (ref >60)
Glucose, Bld: 199 mg/dL — ABNORMAL HIGH (ref 65–99)
POTASSIUM: 4.3 mmol/L (ref 3.5–5.1)
SODIUM: 140 mmol/L (ref 135–145)

## 2015-06-25 LAB — CBC
HCT: 29 % — ABNORMAL LOW (ref 36.0–46.0)
HEMOGLOBIN: 9.4 g/dL — AB (ref 12.0–15.0)
MCH: 30.5 pg (ref 26.0–34.0)
MCHC: 32.4 g/dL (ref 30.0–36.0)
MCV: 94.2 fL (ref 78.0–100.0)
Platelets: 185 10*3/uL (ref 150–400)
RBC: 3.08 MIL/uL — AB (ref 3.87–5.11)
RDW: 15.5 % (ref 11.5–15.5)
WBC: 12.1 10*3/uL — ABNORMAL HIGH (ref 4.0–10.5)

## 2015-06-25 LAB — GLUCOSE, CAPILLARY
GLUCOSE-CAPILLARY: 246 mg/dL — AB (ref 65–99)
Glucose-Capillary: 161 mg/dL — ABNORMAL HIGH (ref 65–99)
Glucose-Capillary: 223 mg/dL — ABNORMAL HIGH (ref 65–99)

## 2015-06-25 LAB — POCT ACTIVATED CLOTTING TIME
Activated Clotting Time: 245 seconds
Activated Clotting Time: 275 seconds

## 2015-06-25 SURGERY — CORONARY STENT INTERVENTION
Anesthesia: LOCAL

## 2015-06-25 MED ORDER — SODIUM CHLORIDE 0.9 % IJ SOLN: 3.0000 mL | INTRAMUSCULAR | Status: DC | PRN

## 2015-06-25 MED ORDER — ANGIOPLASTY BOOK
Freq: Once | Status: AC
  Administered 2015-06-25: 22:00:00
  Filled 2015-06-25: qty 1

## 2015-06-25 MED ORDER — FENTANYL CITRATE (PF) 100 MCG/2ML IJ SOLN
INTRAMUSCULAR | Status: AC
  Filled 2015-06-25: qty 4

## 2015-06-25 MED ORDER — HEPARIN SODIUM (PORCINE) 1000 UNIT/ML IJ SOLN
INTRAMUSCULAR | Status: AC
  Filled 2015-06-25: qty 1

## 2015-06-25 MED ORDER — VERAPAMIL HCL 2.5 MG/ML IV SOLN
INTRAVENOUS | Status: DC | PRN
  Administered 2015-06-25: 15:00:00 via INTRA_ARTERIAL

## 2015-06-25 MED ORDER — MIDAZOLAM HCL 2 MG/2ML IJ SOLN
INTRAMUSCULAR | Status: AC
  Filled 2015-06-25: qty 4

## 2015-06-25 MED ORDER — ZOLPIDEM TARTRATE 5 MG PO TABS
10.0000 mg | ORAL_TABLET | Freq: Every evening | ORAL | Status: DC | PRN
Start: 2015-06-24 — End: 2015-06-26
  Administered 2015-06-25 (×2): 10 mg via ORAL
  Filled 2015-06-25 (×2): qty 2

## 2015-06-25 MED ORDER — LIDOCAINE HCL (PF) 1 % IJ SOLN
INTRAMUSCULAR | Status: AC
  Filled 2015-06-25: qty 30

## 2015-06-25 MED ORDER — SODIUM CHLORIDE 0.9 % IV SOLN
INTRAVENOUS | Status: AC
  Administered 2015-06-25: 19:00:00 via INTRAVENOUS

## 2015-06-25 MED ORDER — MIDAZOLAM HCL 2 MG/2ML IJ SOLN
INTRAMUSCULAR | Status: DC | PRN
  Administered 2015-06-25: 1 mg via INTRAVENOUS
  Administered 2015-06-25: 2 mg via INTRAVENOUS

## 2015-06-25 MED ORDER — LIVING WELL WITH DIABETES BOOK
Freq: Once | Status: AC
  Administered 2015-06-25: 22:00:00
  Filled 2015-06-25: qty 1

## 2015-06-25 MED ORDER — SODIUM CHLORIDE 0.9 % IJ SOLN: 3.0000 mL | Freq: Two times a day (BID) | INTRAMUSCULAR | Status: DC

## 2015-06-25 MED ORDER — FENTANYL CITRATE (PF) 100 MCG/2ML IJ SOLN
INTRAMUSCULAR | Status: DC | PRN
  Administered 2015-06-25: 25 ug via INTRAVENOUS
  Administered 2015-06-25: 50 ug via INTRAVENOUS

## 2015-06-25 MED ORDER — INSULIN ASPART 100 UNIT/ML ~~LOC~~ SOLN
0.0000 [IU] | Freq: Three times a day (TID) | SUBCUTANEOUS | Status: DC
  Administered 2015-06-25: 08:00:00 5 [IU] via SUBCUTANEOUS
  Administered 2015-06-25: 3 [IU] via SUBCUTANEOUS
  Administered 2015-06-26: 07:00:00 2 [IU] via SUBCUTANEOUS

## 2015-06-25 MED ORDER — ASPIRIN 81 MG PO CHEW
81.0000 mg | CHEWABLE_TABLET | Freq: Every day | ORAL | Status: DC
  Administered 2015-06-26: 81 mg via ORAL
  Filled 2015-06-25: qty 1

## 2015-06-25 MED ORDER — IOHEXOL 350 MG/ML SOLN
INTRAVENOUS | Status: DC | PRN
  Administered 2015-06-25: 45 mL via INTRA_ARTERIAL

## 2015-06-25 MED ORDER — HEPARIN SODIUM (PORCINE) 1000 UNIT/ML IJ SOLN
INTRAMUSCULAR | Status: DC | PRN
Start: 2015-06-25 — End: 2015-06-25
  Administered 2015-06-25: 3000 [IU] via INTRAVENOUS
  Administered 2015-06-25: 6000 [IU] via INTRAVENOUS

## 2015-06-25 MED ORDER — LIDOCAINE HCL (PF) 1 % IJ SOLN
INTRAMUSCULAR | Status: DC | PRN
  Administered 2015-06-25: 16:00:00

## 2015-06-25 MED ORDER — HYDROCODONE-ACETAMINOPHEN 5-325 MG PO TABS
1.0000 | ORAL_TABLET | Freq: Three times a day (TID) | ORAL | Status: DC | PRN
  Administered 2015-06-25 – 2015-06-26 (×2): 1 via ORAL
  Filled 2015-06-25 (×2): qty 1

## 2015-06-25 MED ORDER — VERAPAMIL HCL 2.5 MG/ML IV SOLN
INTRAVENOUS | Status: AC
  Filled 2015-06-25: qty 2

## 2015-06-25 MED ORDER — HEPARIN (PORCINE) IN NACL 2-0.9 UNIT/ML-% IJ SOLN
INTRAMUSCULAR | Status: AC
  Filled 2015-06-25: qty 1000

## 2015-06-25 MED ORDER — SODIUM CHLORIDE 0.9 % IV SOLN: 250.0000 mL | INTRAVENOUS | Status: DC | PRN

## 2015-06-25 SURGICAL SUPPLY — 15 items
BALLN ~~LOC~~ EMERGE MR 3.5X8 (BALLOONS) ×2 IMPLANT
CATH INFINITI 5 FR IM (CATHETERS) ×2 IMPLANT
CATH VISTA GUIDE 6FR MPA1 (CATHETERS) ×2 IMPLANT
DEVICE RAD TR BAND REGULAR (VASCULAR PRODUCTS) ×2 IMPLANT
DEVICE SPIDERFX EMB PROT 4MM (WIRE) ×2 IMPLANT
GLIDESHEATH SLEND SS 6F .021 (SHEATH) ×2 IMPLANT
KIT ENCORE 26 ADVANTAGE (KITS) ×2 IMPLANT
KIT HEART LEFT (KITS) ×2 IMPLANT
PACK CARDIAC CATHETERIZATION (CUSTOM PROCEDURE TRAY) ×2 IMPLANT
STENT PROMUS PREM MR 3.0X12 (Permanent Stent) ×2 IMPLANT
TRANSDUCER W/STOPCOCK (MISCELLANEOUS) ×2 IMPLANT
TUBING CIL FLEX 10 FLL-RA (TUBING) ×2 IMPLANT
WIRE COUGAR XT STRL 190CM (WIRE) ×2 IMPLANT
WIRE HI TORQ VERSACORE-J 145CM (WIRE) ×2 IMPLANT
WIRE SAFE-T 1.5MM-J .035X260CM (WIRE) ×2 IMPLANT

## 2015-06-25 NOTE — H&P (View-Only) (Signed)
Patient Name: [name] Date of Encounter: 06/25/2015  Primary Cardiologist: Dr. Burt Knack   Principal Problem:   Angina at rest [facility]) Active Problems:   Essential hypertension   Coronary artery disease involving native heart with angina pectoris ([place])   Chronic diastolic heart failure (HCC)   S/P CABG x 3   S/P Maze operation for atrial fibrillation   Carotid artery stenosis   Subclavian steal syndrome   Subclavian artery stenosis, left   Coronary artery disease involving autologous vein coronary bypass graft with unstable angina pectoris ([place])    SUBJECTIVE  Still has some palpitation feeling (no telemetry irregular rhythm to correlate). Denies any SOB.   CURRENT MEDS . amLODipine  10 mg Oral Daily  . [START ON 06/26/2015] aspirin  81 mg Oral Daily  . budesonide-formoterol  2 puff Inhalation BID  . carvedilol  25 mg Oral BID WC  . cloNIDine  0.2 mg Oral BID  . clopidogrel  75 mg Oral Q breakfast  . diphenhydrAMINE  25 mg Intravenous Pre-Cath  . docusate sodium  100 mg Oral Daily  . famotidine (PEPCID) IV  20 mg Intravenous Pre-Cath  . glipiZIDE  5 mg Oral Q breakfast  . hydrALAZINE  100 mg Oral TID  . insulin aspart  0-15 Units Subcutaneous TID WC  . nicotine  14 mg Transdermal Daily  . pantoprazole  40 mg Oral Daily  . predniSONE  60 mg Oral Pre-Cath  . sodium chloride  3 mL Intravenous Q12H    OBJECTIVE  Filed Vitals:   06/24/15 1900 06/24/15 1927 06/24/15 2145 06/25/15 0340  BP: 162/45  139/35 152/39  Pulse: 57  55 62  Temp:   98.4 F (36.9 C) 98.8 F (37.1 C)  TempSrc:   Oral Oral  Resp: 14  16 18   Height:      Weight:    147 lb 11.3 oz (67 kg)  SpO2: 96% 98% 99% 96%    Intake/Output Summary (Last 24 hours) at 06/25/15 0727 Last data filed at 06/25/15 0345  Gross per 24 hour  Intake 940.35 ml  Output   1050 ml  Net -109.65 ml   Filed Weights   06/24/15 0549 06/25/15 0340  Weight: 147 lb (66.679 kg) 147 lb 11.3 oz (67 kg)    PHYSICAL  EXAM  General: Pleasant, NAD. Neuro: Alert and oriented X 3. Moves all extremities spontaneously. Psych: Normal affect. HEENT:  Normal  Neck: Supple without bruits or JVD. Lungs:  Resp regular and unlabored, CTA. Heart: RRR no s3, s4. L femoral cath site stable, without bleeding or hematoma. 3/6 systolic murmur Abdomen: Soft, non-tender, non-distended, BS + x 4.  Extremities: No clubbing, cyanosis or edema. DP/PT/Radials 2+ and equal bilaterally.  Accessory Clinical Findings  CBC  Recent Labs  06/25/15 0415  WBC 12.1*  HGB 9.4*  HCT 29.0*  MCV 94.2  PLT 123XX123   Basic Metabolic Panel  Recent Labs  06/24/15 0642 06/25/15 0415  NA 137 140  K 4.9 4.3  CL 103 107  CO2 23 22  GLUCOSE 210* 199*  BUN 23* 28*  CREATININE 1.71* 1.71*  CALCIUM 8.8* 9.1    TELE NSR without significant ventricular ectopy    ECG  No new EKG  Echocardiogram 07/05/2014  LV EF: 55% -  60%  ------------------------------------------------------------------- Indications:   MVD (I05.9). Atrial fibrillation (I48.91). CAD (I25.10). Chronic diastolic CHF (99991111).  ------------------------------------------------------------------- History:  PMH: Acquired from the patient and from the patient&'s chart. Paroxysmal atrial  fibrillation. Coronary artery disease. Congestive heart failure, with an ejection fraction of 55%by echocardiography. The dysfunction is primarily diastolic. Mitral valve disease. Chronic obstructive pulmonary disease. Risk factors: Hypertension.  ------------------------------------------------------------------- Study Conclusions  - Left ventricle: The cavity size was normal. Wall thickness was normal. Systolic function was normal. The estimated ejection fraction was in the range of 55% to 60%. Wall motion was normal; there were no regional wall motion abnormalities. - Mitral valve: The mean gradient is 42mmHg with pressure half time valve area  1.62cm squared. The findings are consistent with mild to moderate stenosis. Mean gradient (D): 9 mm Hg. Peak gradient (D): 23 mm Hg. Valve area by pressure half-time: 1.62 cm^2. Valve area by continuity equation (using LVOT flow): 1.02 cm^2. - Left atrium: The atrium was mildly dilated. - Right ventricle: The cavity size was mildly dilated. Wall thickness was normal. - Pulmonary arteries: Systolic pressure was moderately increased. PA peak pressure: 51 mm Hg (S).  Impressions:  - Compared to the prior study, there has been no significant interval change.    Radiology/Studies  No results found.  ASSESSMENT AND PLAN  64 yo female with PMH of CAD s/p CABG 03/2011 with LIMA to LAD, SVG to OM, and SVG to PDA, PAD, HTN, HLD, DM, PAF s/p Cox Maze 7/12 with LAA clipping, CKD stage 3 and h/o MR s/p MV repair 03/2011 noted to have L subclavian stenosis on recent hospitalization and noted to have anginal symptom on recent office visit came in for coordinated angioplasty of subclavian stenosis and coronary anatomy. Underwent angioplasty of coronary and subclavian artery on 10/18, plan for staged PCI on 10/19 by Dr. Burt Knack  1. Unstable angina  - 85% ost RCA pesion, 55% prox RCA lesion, 90% prox SVG to RCA, 100% ost LM and mid LAD, LIMA could not be visualized  - planning for staged PCI today for SVG to RCA via L radial approach which will allow visualization of LIMA to LAD. Premedicated for contrast allergy  - not sure why patient is not on daily ASA, pre-cath ASA ordered, will add as she will need DAPT post PCI anyway  2. Symptomatic L subclavian stenosis  - vascular surgery performed angiography at the same time as coronary angiography 10/18, 80% L subclavian artery stenosis treated with 8x18 balloon expandable stent with <10% residual   3. CAD s/p CABG 03/2011 with LIMA to LAD, SVG to OM, and SVG to PDA  4. PAD 5. HTN 6. HLD 7. DM: SSI 8. PAF s/p Cox Maze 7/12 with LAA  clipping 9. CKD stage 3, Cr stable at 1.71 post cath 10. h/o MR s/p MV repair 03/2011   - had 3/6 systolic murmur at apex, noted to have mild to moderate MS on echo 07/05/2014 which should be diastolic, consider reassess with echocardiogram   Signed, Woodward Ku Pager: [phone] Patient is scheduled for staged PCI today.  Daryel November, MD

## 2015-06-25 NOTE — Interval H&P Note (Signed)
Cath Lab Visit (complete for each Cath Lab visit)  Clinical Evaluation Leading to the Procedure:   ACS: No.  Non-ACS:    Anginal Classification: CCS III  Anti-ischemic medical therapy: Maximal Therapy (2 or more classes of medications)  Non-Invasive Test Results: No non-invasive testing performed  Prior CABG: Previous CABG      History and Physical Interval Note:  06/25/2015 3:07 PM  Linden Dolin Artus  has presented today for surgery, with the diagnosis of cad  The various methods of treatment have been discussed with the patient and family. After consideration of risks, benefits and other options for treatment, the patient has consented to  Procedure(s): Coronary Stent Intervention (N/A) as a surgical intervention .  The patient's history has been reviewed, patient examined, no change in status, stable for surgery.  I have reviewed the patient's chart and labs.  Questions were answered to the patient's satisfaction.     Sherren Mocha

## 2015-06-25 NOTE — Telephone Encounter (Signed)
Unable to reach pt, the VM is not set up. Mailed AVS, dpm

## 2015-06-25 NOTE — Progress Notes (Signed)
Patient Name: [name] Date of Encounter: 06/25/2015  Primary Cardiologist: Dr. Burt Knack   Principal Problem:   Angina at rest [facility]) Active Problems:   Essential hypertension   Coronary artery disease involving native heart with angina pectoris ([place])   Chronic diastolic heart failure (HCC)   S/P CABG x 3   S/P Maze operation for atrial fibrillation   Carotid artery stenosis   Subclavian steal syndrome   Subclavian artery stenosis, left   Coronary artery disease involving autologous vein coronary bypass graft with unstable angina pectoris ([place])    SUBJECTIVE  Still has some palpitation feeling (no telemetry irregular rhythm to correlate). Denies any SOB.   CURRENT MEDS . amLODipine  10 mg Oral Daily  . [START ON 06/26/2015] aspirin  81 mg Oral Daily  . budesonide-formoterol  2 puff Inhalation BID  . carvedilol  25 mg Oral BID WC  . cloNIDine  0.2 mg Oral BID  . clopidogrel  75 mg Oral Q breakfast  . diphenhydrAMINE  25 mg Intravenous Pre-Cath  . docusate sodium  100 mg Oral Daily  . famotidine (PEPCID) IV  20 mg Intravenous Pre-Cath  . glipiZIDE  5 mg Oral Q breakfast  . hydrALAZINE  100 mg Oral TID  . insulin aspart  0-15 Units Subcutaneous TID WC  . nicotine  14 mg Transdermal Daily  . pantoprazole  40 mg Oral Daily  . predniSONE  60 mg Oral Pre-Cath  . sodium chloride  3 mL Intravenous Q12H    OBJECTIVE  Filed Vitals:   06/24/15 1900 06/24/15 1927 06/24/15 2145 06/25/15 0340  BP: 162/45  139/35 152/39  Pulse: 57  55 62  Temp:   98.4 F (36.9 C) 98.8 F (37.1 C)  TempSrc:   Oral Oral  Resp: 14  16 18   Height:      Weight:    147 lb 11.3 oz (67 kg)  SpO2: 96% 98% 99% 96%    Intake/Output Summary (Last 24 hours) at 06/25/15 0727 Last data filed at 06/25/15 0345  Gross per 24 hour  Intake 940.35 ml  Output   1050 ml  Net -109.65 ml   Filed Weights   06/24/15 0549 06/25/15 0340  Weight: 147 lb (66.679 kg) 147 lb 11.3 oz (67 kg)    PHYSICAL  EXAM  General: Pleasant, NAD. Neuro: Alert and oriented X 3. Moves all extremities spontaneously. Psych: Normal affect. HEENT:  Normal  Neck: Supple without bruits or JVD. Lungs:  Resp regular and unlabored, CTA. Heart: RRR no s3, s4. L femoral cath site stable, without bleeding or hematoma. 3/6 systolic murmur Abdomen: Soft, non-tender, non-distended, BS + x 4.  Extremities: No clubbing, cyanosis or edema. DP/PT/Radials 2+ and equal bilaterally.  Accessory Clinical Findings  CBC  Recent Labs  06/25/15 0415  WBC 12.1*  HGB 9.4*  HCT 29.0*  MCV 94.2  PLT 123XX123   Basic Metabolic Panel  Recent Labs  06/24/15 0642 06/25/15 0415  NA 137 140  K 4.9 4.3  CL 103 107  CO2 23 22  GLUCOSE 210* 199*  BUN 23* 28*  CREATININE 1.71* 1.71*  CALCIUM 8.8* 9.1    TELE NSR without significant ventricular ectopy    ECG  No new EKG  Echocardiogram 07/05/2014  LV EF: 55% -  60%  ------------------------------------------------------------------- Indications:   MVD (I05.9). Atrial fibrillation (I48.91). CAD (I25.10). Chronic diastolic CHF (99991111).  ------------------------------------------------------------------- History:  PMH: Acquired from the patient and from the patient&'s chart. Paroxysmal atrial  fibrillation. Coronary artery disease. Congestive heart failure, with an ejection fraction of 55%by echocardiography. The dysfunction is primarily diastolic. Mitral valve disease. Chronic obstructive pulmonary disease. Risk factors: Hypertension.  ------------------------------------------------------------------- Study Conclusions  - Left ventricle: The cavity size was normal. Wall thickness was normal. Systolic function was normal. The estimated ejection fraction was in the range of 55% to 60%. Wall motion was normal; there were no regional wall motion abnormalities. - Mitral valve: The mean gradient is 53mmHg with pressure half time valve area  1.62cm squared. The findings are consistent with mild to moderate stenosis. Mean gradient (D): 9 mm Hg. Peak gradient (D): 23 mm Hg. Valve area by pressure half-time: 1.62 cm^2. Valve area by continuity equation (using LVOT flow): 1.02 cm^2. - Left atrium: The atrium was mildly dilated. - Right ventricle: The cavity size was mildly dilated. Wall thickness was normal. - Pulmonary arteries: Systolic pressure was moderately increased. PA peak pressure: 51 mm Hg (S).  Impressions:  - Compared to the prior study, there has been no significant interval change.    Radiology/Studies  No results found.  ASSESSMENT AND PLAN  64 yo female with PMH of CAD s/p CABG 03/2011 with LIMA to LAD, SVG to OM, and SVG to PDA, PAD, HTN, HLD, DM, PAF s/p Cox Maze 7/12 with LAA clipping, CKD stage 3 and h/o MR s/p MV repair 03/2011 noted to have L subclavian stenosis on recent hospitalization and noted to have anginal symptom on recent office visit came in for coordinated angioplasty of subclavian stenosis and coronary anatomy. Underwent angioplasty of coronary and subclavian artery on 10/18, plan for staged PCI on 10/19 by Dr. Burt Knack  1. Unstable angina  - 85% ost RCA pesion, 55% prox RCA lesion, 90% prox SVG to RCA, 100% ost LM and mid LAD, LIMA could not be visualized  - planning for staged PCI today for SVG to RCA via L radial approach which will allow visualization of LIMA to LAD. Premedicated for contrast allergy  - not sure why patient is not on daily ASA, pre-cath ASA ordered, will add as she will need DAPT post PCI anyway  2. Symptomatic L subclavian stenosis  - vascular surgery performed angiography at the same time as coronary angiography 10/18, 80% L subclavian artery stenosis treated with 8x18 balloon expandable stent with <10% residual   3. CAD s/p CABG 03/2011 with LIMA to LAD, SVG to OM, and SVG to PDA  4. PAD 5. HTN 6. HLD 7. DM: SSI 8. PAF s/p Cox Maze 7/12 with LAA  clipping 9. CKD stage 3, Cr stable at 1.71 post cath 10. h/o MR s/p MV repair 03/2011   - had 3/6 systolic murmur at apex, noted to have mild to moderate MS on echo 07/05/2014 which should be diastolic, consider reassess with echocardiogram   Signed, Woodward Ku Pager: [phone] Patient is scheduled for staged PCI today.  Daryel November, MD

## 2015-06-25 NOTE — Telephone Encounter (Signed)
-----   Message from Mena Goes, RN sent at 06/24/2015  1:42 PM EDT ----- Regarding: schedule   ----- Message -----    From: Serafina Mitchell, MD    Sent: 06/24/2015   9:05 AM      To: [facility] Charge Pool  06/24/2015:  Surgeon:  [name] Procedure Performed:  1.  Ultrasound-guided access, left femoral artery  2.  Abdominal aortogram  3.  Aortic arch angiogram  4.  Left subclavian artery stent   Follow-up with Dr. [name] in one month with a left subclavian artery duplex

## 2015-06-26 ENCOUNTER — Encounter (HOSPITAL_COMMUNITY): Payer: Self-pay | Admitting: Cardiovascular Disease

## 2015-06-26 DIAGNOSIS — D649 Anemia, unspecified: Secondary | ICD-10-CM

## 2015-06-26 LAB — CBC
HCT: 25.5 % — ABNORMAL LOW (ref 36.0–46.0)
HEMATOCRIT: 28 % — AB (ref 36.0–46.0)
Hemoglobin: 8.3 g/dL — ABNORMAL LOW (ref 12.0–15.0)
Hemoglobin: 9 g/dL — ABNORMAL LOW (ref 12.0–15.0)
MCH: 30.6 pg (ref 26.0–34.0)
MCH: 30.3 pg (ref 26.0–34.0)
MCHC: 32.5 g/dL (ref 30.0–36.0)
MCHC: 32.1 g/dL (ref 30.0–36.0)
MCV: 94.1 fL (ref 78.0–100.0)
MCV: 94.3 fL (ref 78.0–100.0)
PLATELETS: 162 10*3/uL (ref 150–400)
PLATELETS: 166 10*3/uL (ref 150–400)
RBC: 2.71 MIL/uL — ABNORMAL LOW (ref 3.87–5.11)
RBC: 2.97 MIL/uL — ABNORMAL LOW (ref 3.87–5.11)
RDW: 15.7 % — AB (ref 11.5–15.5)
RDW: 15.8 % — AB (ref 11.5–15.5)
WBC: 9 10*3/uL (ref 4.0–10.5)
WBC: 9.5 10*3/uL (ref 4.0–10.5)

## 2015-06-26 LAB — BASIC METABOLIC PANEL
Anion gap: 8 (ref 5–15)
BUN: 30 mg/dL — AB (ref 6–20)
CO2: 20 mmol/L — ABNORMAL LOW (ref 22–32)
CREATININE: 1.43 mg/dL — AB (ref 0.44–1.00)
Calcium: 8.4 mg/dL — ABNORMAL LOW (ref 8.9–10.3)
Chloride: 113 mmol/L — ABNORMAL HIGH (ref 101–111)
GFR calc Af Amer: 44 mL/min — ABNORMAL LOW (ref >60)
GFR, EST NON AFRICAN AMERICAN: 38 mL/min — AB (ref >60)
Glucose, Bld: 137 mg/dL — ABNORMAL HIGH (ref 65–99)
POTASSIUM: 4.5 mmol/L (ref 3.5–5.1)
SODIUM: 141 mmol/L (ref 135–145)

## 2015-06-26 LAB — GLUCOSE, CAPILLARY
GLUCOSE-CAPILLARY: 83 mg/dL (ref 65–99)
Glucose-Capillary: 128 mg/dL — ABNORMAL HIGH (ref 65–99)

## 2015-06-26 MED ORDER — ASPIRIN 81 MG PO CHEW: 81.0000 mg | CHEWABLE_TABLET | Freq: Every day | ORAL | Status: DC

## 2015-06-26 MED ORDER — CLOPIDOGREL BISULFATE 75 MG PO TABS: 75.0000 mg | ORAL_TABLET | Freq: Every day | ORAL | Status: DC

## 2015-06-26 MED ORDER — PANTOPRAZOLE SODIUM 40 MG PO TBEC: 40.0000 mg | DELAYED_RELEASE_TABLET | Freq: Every day | ORAL | Status: DC

## 2015-06-26 NOTE — Progress Notes (Addendum)
Patient Name:  [name], DOB: Apr 16, 1951, MRN: [id number] Primary Doctor: Phineas Inches, MD Primary Cardiologist:   Date: 06/26/2015   SUBJECTIVE:  The patient is feeling well today. On June 24, 2015, she had a subclavian stent placed. This was done through the femoral vein and the site is stable. On June 25, 2015, she had coronary stenting done through the left radial artery. She did have some bleeding after the sheath was removed. She has some swelling in her left arm. Her arm is being elevated. She is not having any pain.  Her hemoglobin was 9.4 on the day of admission. Hemoglobin today is 8.3. This is being repeated..  Her systolic blood pressure is elevated. She has just received her morning medications.  Past Medical History  Diagnosis Date  . Coronary artery disease 5/09    s/p CABG 7/12   L-LAD, S-OM, S-PDA  . PAD (peripheral artery disease) (HCC)     Severe; s/p bilateral renal artery stents, moderate in-stent restenosis  . AAA (abdominal aortic aneurysm) ([place]) 5/09    3.7x3.3 by u/s 2009  . carotid stenosis 5/09    S/p L CEA ;  60-79% bilat ICA by preCABG dopplers 7/12  . Hypertension   . Hyperlipidemia   . COPD (chronic obstructive pulmonary disease) ([place])   . GERD (gastroesophageal reflux disease)     With hiatal hernia  . Paroxysmal atrial fibrillation (HCC)     coumadin;  echo 9/07: EF 60%, mild LVH;  s/p Cox Maze 7/12 with LAA clipping  . GIB (gastrointestinal bleeding) 9/11    S/p EGD with cautery at [facility]  . Abnormal EKG     deep TW inversions chronic  . CKD (chronic kidney disease)   . Mitral regurgitation     3+ MR by intraoperative TEE;  s/p MV repair with Dr. Roxy Manns 7/12  . Anemia   . CHF (congestive heart failure) ([place])   . Complication of anesthesia 1966    "problem w/ether"  . Heart murmur   . Anginal pain ([place])   . Myocardial infarction [facility]) 2012 "several"  . Chronic bronchitis ([place])     "get it pretty much q yr" (06/24/2015)  .  On home oxygen therapy     "2 liters at night; negative for sleep apnea"  . Type II diabetes mellitus ([place])   . History of blood transfusion "a few times"    "all related to anemia" (06/24/2015)  . History of hiatal hernia   . Chronic back pain     "all over" (06/24/2015)  . Depression    Filed Vitals:   06/25/15 2000 06/25/15 2037 06/25/15 2100 06/26/15 0348  BP: 152/46 152/46 165/59 150/37  Pulse: 53 56 63 48  Temp:  98.1 F (36.7 C)  98.2 F (36.8 C)  TempSrc:  Oral  Oral  Resp: 18 17 18 20   Height:      Weight:    148 lb 13 oz (67.5 kg)  SpO2: 98% 98% 100% 99%    Intake/Output Summary (Last 24 hours) at 06/26/15 0833 Last data filed at 06/25/15 2200  Gross per 24 hour  Intake    240 ml  Output    900 ml  Net   -660 ml   Filed Weights   06/24/15 0549 06/25/15 0340 06/26/15 0348  Weight: 147 lb (66.679 kg) 147 lb 11.3 oz (67 kg) 148 lb 13 oz (67.5 kg)     LABS: Basic Metabolic Panel:  Recent Labs  06/25/15 0415 06/26/15 0445  NA 140 141  K 4.3 4.5  CL 107 113*  CO2 22 20*  GLUCOSE 199* 137*  BUN 28* 30*  CREATININE 1.71* 1.43*  CALCIUM 9.1 8.4*   Liver Function Tests: No results for input(s): AST, ALT, ALKPHOS, BILITOT, PROT, ALBUMIN in the last 72 hours. No results for input(s): LIPASE, AMYLASE in the last 72 hours. CBC:  Recent Labs  06/25/15 0415 06/26/15 0445  WBC 12.1* 9.0  HGB 9.4* 8.3*  HCT 29.0* 25.5*  MCV 94.2 94.1  PLT 185 162   Cardiac Enzymes: No results for input(s): CKTOTAL, CKMB, CKMBINDEX, TROPONINI in the last 72 hours. BNP: Invalid input(s): POCBNP D-Dimer: No results for input(s): DDIMER in the last 72 hours. Thyroid Function Tests: No results for input(s): TSH, T4TOTAL, T3FREE, THYROIDAB in the last 72 hours.  Invalid input(s): FREET3  RADIOLOGY: No results found.  PHYSICAL EXAM  the patient is oriented to person time and place. Affect is normal. She wants very much to go home. Lungs are clear. Respiratory effort  is not labored. Cardiac exam reveals an S1 and S2. Abdomen is soft. There is no peripheral edema. Her left arm is warm. The radial pulses good. There is mild swelling of her hand and her forearm.   TELEMETRY: I have reviewed telemetry today June 26, 2015. There is normal sinus rhythm with mild sinus bradycardia.   ASSESSMENT AND PLAN:      Essential hypertension     I'm concerned about her elevated systolic pressure this morning. She is very anxious and hoping to go home at this time. Also she has just received her morning medications. The plan will be to watch her blood pressure as the morning progresses and then decide if any further medicine changes are needed.    Coronary artery disease involving native heart with angina pectoris ([place])      Coronary disease is stable. She had successful coronary stenting yesterday. She does have swelling of the left arm. Her pulses good. I suspect she will be stable for discharge home. However, her left arm will be reassessed further before the final decision is made.    Subclavian artery stenosis, left     This admission the patient has had stenting of the subclavian artery. She is doing well. The femoral cath site is stable.    Anemia    Hemoglobin was 9.4 on the day of admission. Hemoglobin is 8.3 today. There is no obvious bleeding at this time. The femoral cath site is stable. There has been very slight bleeding into the left arm. Repeat hemoglobin is being done to be sure that there is not a rapid trend downward.  The overall plan is most likely for the patient to go home today.   Dola Argyle 06/26/2015 8:33 AM

## 2015-06-26 NOTE — Progress Notes (Addendum)
       S/P Left subclavian artery stent  Comfortable no new complaints Left groin soft without hematoma Palpable radial pulses left UE, grip 5/5, and sensation intact  F/U with Dr. Trula Slade office will call for appointment.   Decker Cogdell MAUREEN PA-C

## 2015-06-26 NOTE — Discharge Summary (Signed)
Discharge Summary   Patient ID: [name],  MRN: [id number], DOB/AGE: 1950-11-25 64 y.o.  Admit date: 06/24/2015 Discharge date: 06/26/2015  Primary Care Provider: Bernerd Limbo E Primary Cardiologist: Dr. Burt Knack  Discharge Diagnoses Principal Problem:   Angina at rest [facility]) Active Problems:   Essential hypertension   Coronary artery disease involving native heart with angina pectoris ([place])   Chronic diastolic heart failure (HCC)   S/P CABG x 3   S/P Maze operation for atrial fibrillation   Carotid artery stenosis   Subclavian steal syndrome   Subclavian artery stenosis, left   Coronary artery disease involving autologous vein coronary bypass graft with unstable angina pectoris (HCC)   Anemia   Allergies Allergies  Allergen Reactions  . Imdur [Isosorbide Dinitrate] Other (See Comments)    "felt like my insides were going to explode"  . Codeine Itching  . Contrast Media [Iodinated Diagnostic Agents] Itching and Other (See Comments)    Itching of feet  . Ioxaglate Itching and Other (See Comments)    Itching of feet  . Metrizamide Itching and Other (See Comments)    Itching of feet  . Percocet [Oxycodone-Acetaminophen] Itching and Other (See Comments)    Tolerates Hydrocodone    Procedures  Cardiac catheterization #1 06/24/2015 Conclusion    1. Ost LM to LM lesion, 100% stenosed. 2. Ost RCA lesion, 85% stenosed. Prox & RCA lesion, 55% stenosed. 3. Origin to Prox Graft lesion, 90% stenosed. 4. Retrograde flow fills just proximal to PDA-PAV bifurcation. R-L Collaterals via septal to proximal SP1. 5. Mid LAD lesion, 100% stenosed. LIMA Graft unable to be visualized. 6. Elevated LVEDP with low normal EF   The patient has severe native vessel disease as described with left main occlusion and ostial RCA stenosis that is severe.  She has severe stenosis of the proximal SVG-RCA that will require PCI. Fortunately she has elevated creatinine level and we were unable  to visualize the LIMA graft.  Plan:  Admit overnight observation for hydration post cath and precath.   Plan will be staged PCI of SVG-RCA tomorrow by Dr. Burt Knack. If done via Left Radial approach, this can allow for LIMA-LAD angiography as well.  She'll be loaded with 600 mg Plavix in the Cath Lab and started on Plavix tomorrow.  She will require most Medication with prednisone as well as pre-medication with prednisone, Benadryl and Pepcid tomorrow.      Arterial angiography with L subclavian stent for stenosis by Dr. Trula Slade 06/24/2015 Procedure Performed: 1. Ultrasound-guided access, left femoral artery 2. Abdominal aortogram 3. Aortic arch angiogram 4. Left subclavian artery stent  Impression: #1 80% left subclavian artery stenosis successfully treated using a 8 x 18 balloon expandable stent with residual stenosis less than 10% #2 type I aortic arch    Cardiac catheterization #2 06/25/2015 Conclusion    1. Widely patent LIMA-LAD graft 2. Successful PCI of the SVG-right PDA with a DES using distal embolic protection with a Spider device       Hospital Course  64 yo female with PMH of CAD s/p CABG 03/2011 with LIMA to LAD, SVG to OM, and SVG to PDA, PAD, HTN, HLD, DM, PAF s/p Cox Maze 7/12 with LAA clipping, CKD stage 3 and h/o MR s/p MV repair 03/2011. Patient's mild annuloplasty ring was well seated with mild mitral stenosis and trivial regurgitation. she was hospitalized at [facility] in August with a ruptured appendix and had laprascopic appendectomy. She was also noted to have a pelvic  abscess. Postoperatively, she had elevated troponin and was seen by cardiology. Recommended she undergo treatment for left subclavian stenosis since she had LIMA to LAD graft. She was seen by vascular surgery, Dr. [name] on 06/10/2015 who recommended arteriogram with stenting of left subclavian artery.  Patient was also noted to have some anginal symptoms. After discussing with Dr. Burt Knack, he recommended coronary angiography at same time as arterial angiogram. The case has been coordinated with vascular surgery, patient eventually presented for the procedure on 06/24/2015. Coronary angiography showed 85% ost RCA pesion, 55% prox RCA lesion, 90% prox SVG to RCA, 100% ost LM and mid LAD, LIMA could not be visualized, staged PCI was planned. Vascular surgery performed angiography at the same time as coronary angiography 10/18, 80% L subclavian artery stenosis treated with 8x18 balloon expandable stent with <10% residual.   Patient was brought back to the cath lab on 10/19 for staged PCI, she underwent successful PCI of SVG to right PDA with DES (3.0x12 mm Promus DES, posted dilated to 3.5mm) using distal embolic protection with a Spider device, patent LIMA to LAD and patent SVG to OM. She was seen on the following day, at which time she was doing well without significant discomfort. She did have significant bruising from radial cath site, her hemoglobin has decreased down from 9.4 to 8.3 overnight. A CBC was repeated and her hemoglobin went up to 9.0. Her arm is soft without sign of compartment syndrome. She is deemed stable for discharge from cardiology perspective. I have arranged close outpatient follow-up in 2-4 weeks. I have discussed with patient the need to be compliant with DAPT. She does have a murmur on exam near apex however appears to have no symptom at this time, last echo in 2015 showed stable mitral valve annuloplasty with mild MS. I will defer repeat echocardiogram to patient's primary cardiologist. I have discussed with patient to continue monitor for sign of bleeding or swelling, esp now she is on ASA and plavix. She has been ambulated by Cardiac rehab and outpatient cardiac rehab referral made.   Discharge Vitals Blood pressure 174/37, pulse 52, temperature 98 F (36.7 C), temperature source  Oral, resp. rate 20, height 5\' 5"  (1.651 m), weight 148 lb 13 oz (67.5 kg), SpO2 99 %.  Filed Weights   06/24/15 0549 06/25/15 0340 06/26/15 0348  Weight: 147 lb (66.679 kg) 147 lb 11.3 oz (67 kg) 148 lb 13 oz (67.5 kg)    Labs  CBC  Recent Labs  06/26/15 0445 06/26/15 0845  WBC 9.0 9.5  HGB 8.3* 9.0*  HCT 25.5* 28.0*  MCV 94.1 94.3  PLT 162 [id number]   Basic Metabolic Panel  Recent Labs  06/25/15 0415 06/26/15 0445  NA 140 141  K 4.3 4.5  CL 107 113*  CO2 22 20*  GLUCOSE 199* 137*  BUN 28* 30*  CREATININE 1.71* 1.43*  CALCIUM 9.1 8.4*    Disposition  Pt is being discharged home today in good condition.  Follow-up Plans & Appointments      Follow-up Information    Follow up with Richardson Dopp, PA-C On 07/10/2015.   Specialties:  Physician Assistant, Radiology, Interventional Cardiology   Why:  2:20pm   Contact information:   [address] [address] [phone]       Follow up with Tinnie Gens, MD On 07/29/2015.   Specialties:  Vascular Surgery, Interventional Cardiology, Cardiology   Why:  Carotid doppler at 9AM and followup appt with  Dr. [name] at 9:45am   Contact information:   [address] [place] [address] [phone]       Discharge Medications    Medication List    STOP taking these medications        aspirin 325 MG tablet  Replaced by:  aspirin 81 MG chewable tablet     omeprazole 20 MG capsule  Commonly known as:  PRILOSEC  Replaced by:  pantoprazole 40 MG tablet     predniSONE 20 MG tablet  Commonly known as:  DELTASONE      TAKE these medications        albuterol 108 (90 BASE) MCG/ACT inhaler  Commonly known as:  PROVENTIL HFA;VENTOLIN HFA  Inhale 2 puffs into the lungs every 4 (four) hours as needed for wheezing or shortness of breath. ProAir     amLODipine 10 MG tablet  Commonly known as:  NORVASC  Take 10 mg by mouth daily.     aspirin 81 MG chewable tablet  Chew 1 tablet (81 mg  total) by mouth daily.     Aspirin-Caffeine 845-65 MG Pack  Take 1 packet by mouth daily as needed (aches and pains).     budesonide-formoterol 160-4.5 MCG/ACT inhaler  Commonly known as:  SYMBICORT  Inhale 2 puffs into the lungs 2 (two) times daily.     carvedilol 25 MG tablet  Commonly known as:  COREG  Take 1 tablet (25 mg total) by mouth 2 (two) times daily with a meal.  Notes to Patient:  Today      cloNIDine 0.2 MG tablet  Commonly known as:  CATAPRES  Take 0.2 mg by mouth 2 (two) times daily.     clopidogrel 75 MG tablet  Commonly known as:  PLAVIX  Take 1 tablet (75 mg total) by mouth daily with breakfast.     ferrous gluconate 325 MG tablet  Commonly known as:  FERGON  Take 325 mg by mouth 2 (two) times daily.     furosemide 20 MG tablet  Commonly known as:  LASIX  Take 1 tablet (20 mg total) by mouth daily.     glipiZIDE 5 MG 24 hr tablet  Commonly known as:  GLUCOTROL XL  Take 5 mg by mouth daily with breakfast.     hydrALAZINE 100 MG tablet  Commonly known as:  APRESOLINE  TAKE 1 TABLET BY MOUTH THREE TIMES DAILY     HYDROcodone-acetaminophen 5-325 MG tablet  Commonly known as:  NORCO/VICODIN  Take 1 tablet by mouth 3 (three) times daily as needed for moderate pain.     pantoprazole 40 MG tablet  Commonly known as:  PROTONIX  Take 1 tablet (40 mg total) by mouth daily.     pravastatin 80 MG tablet  Commonly known as:  PRAVACHOL  TAKE 1 TABLET BY MOUTH DAILY     zolpidem 10 MG tablet  Commonly known as:  AMBIEN  Take 10 mg by mouth at bedtime.        Duration of Discharge Encounter   Greater than 30 minutes including physician time.  Hilbert Corrigan PA-C Pager: [phone] 06/26/2015, 12:45 PM Patient seen and examined. I agree with the assessment and plan as detailed above. See also my additional thoughts below.   Refer to my two progress notes today. The patient is now stable and ready for discharge. All of the plans are outlined. I made  the decision for her to go home.  Dola Argyle, MD, [facility] 06/26/2015 1:17  PM

## 2015-06-26 NOTE — Progress Notes (Signed)
CARDIAC REHAB PHASE I   PRE:  Rate/Rhythm: 48 SB with PVCs    BP: sitting 174/37    SaO2:   MODE:  Ambulation: 460 ft   POST:  Rate/Rhythm: 71 SR    BP: sitting 212/55, retake with manual cuff 192/60     SaO2:   Tolerated well. Sts she still has palpitations at times. ? If coinciding with PVCs. BP elevated. In depth ed completed. Pt is thinking about quitting smoking. Encouraged her to get a plan and have support. She has many emotional factors at this time. Long discussion of this and ways to quit smoking. Understands importance of Plavix/ASA. Will refer to [place]. [id number]   [name] [place] CES, ACSM 06/26/2015 9:50 AM

## 2015-06-26 NOTE — Progress Notes (Signed)
The patient's follow-up hemoglobin is 9.0. Patient has been seen by Dr. Burt Knack. He feels that she is stable for discharge. I have also seen her a second time. She is stable. We will proceed with discharge.  Daryel November, MD

## 2015-06-26 NOTE — Discharge Instructions (Signed)
No driving for 24 hours. No lifting over 5 lbs for 1 week. No sexual activity for 1 week. Keep procedure site clean & dry. If you notice increased pain, swelling, bleeding or pus, call/return!  You may shower, but no soaking baths/hot tubs/pools for 1 week.  ° ° °

## 2015-06-26 NOTE — Progress Notes (Signed)
TR BAND REMOVAL  LOCATION:    left radial  DEFLATED PER PROTOCOL:    Yes.    TIME BAND OFF / DRESSING APPLIED:    22:45   SITE UPON ARRIVAL:    Level 1  SITE AFTER BAND REMOVAL:    Level 1  REVERSE ALLEN'S TEST:     positive  CIRCULATION SENSATION AND MOVEMENT:    Within Normal Limits   Yes.    COMMENTS:   Bruise, no hematoma

## 2015-06-27 ENCOUNTER — Telehealth: Payer: Self-pay | Admitting: Cardiology

## 2015-06-27 NOTE — Telephone Encounter (Signed)
Patient contacted regarding discharge from  [facility] on 06/26/15.  Patient understands to follow up with provider SCOTT WEAVER on 07/10/15 at [place]. Patient understands discharge instructions? yes  Patient understands medications and regiment?yes  Patient understands to bring all medications to this visit? yes

## 2015-06-27 NOTE — Telephone Encounter (Signed)
D/C phone call .Marland Kitchen Appt is on 07/10/15 at 2:20pm w/ Richardson Dopp at the [facility] office    Thanks

## 2015-07-03 ENCOUNTER — Encounter (HOSPITAL_COMMUNITY): Payer: Self-pay | Admitting: Emergency Medicine

## 2015-07-03 DIAGNOSIS — I2510 Atherosclerotic heart disease of native coronary artery without angina pectoris: Secondary | ICD-10-CM | POA: Insufficient documentation

## 2015-07-03 DIAGNOSIS — Z79899 Other long term (current) drug therapy: Secondary | ICD-10-CM | POA: Insufficient documentation

## 2015-07-03 DIAGNOSIS — I509 Heart failure, unspecified: Secondary | ICD-10-CM | POA: Insufficient documentation

## 2015-07-03 DIAGNOSIS — R079 Chest pain, unspecified: Principal | ICD-10-CM | POA: Insufficient documentation

## 2015-07-03 DIAGNOSIS — E119 Type 2 diabetes mellitus without complications: Secondary | ICD-10-CM | POA: Insufficient documentation

## 2015-07-03 DIAGNOSIS — Z7951 Long term (current) use of inhaled steroids: Secondary | ICD-10-CM | POA: Insufficient documentation

## 2015-07-03 DIAGNOSIS — F329 Major depressive disorder, single episode, unspecified: Secondary | ICD-10-CM | POA: Insufficient documentation

## 2015-07-03 DIAGNOSIS — I129 Hypertensive chronic kidney disease with stage 1 through stage 4 chronic kidney disease, or unspecified chronic kidney disease: Secondary | ICD-10-CM | POA: Insufficient documentation

## 2015-07-03 DIAGNOSIS — I480 Paroxysmal atrial fibrillation: Secondary | ICD-10-CM | POA: Insufficient documentation

## 2015-07-03 DIAGNOSIS — Z7902 Long term (current) use of antithrombotics/antiplatelets: Secondary | ICD-10-CM | POA: Insufficient documentation

## 2015-07-03 DIAGNOSIS — D649 Anemia, unspecified: Secondary | ICD-10-CM | POA: Insufficient documentation

## 2015-07-03 DIAGNOSIS — R011 Cardiac murmur, unspecified: Secondary | ICD-10-CM | POA: Insufficient documentation

## 2015-07-03 DIAGNOSIS — J449 Chronic obstructive pulmonary disease, unspecified: Secondary | ICD-10-CM | POA: Insufficient documentation

## 2015-07-03 DIAGNOSIS — I252 Old myocardial infarction: Secondary | ICD-10-CM | POA: Insufficient documentation

## 2015-07-03 DIAGNOSIS — I739 Peripheral vascular disease, unspecified: Secondary | ICD-10-CM | POA: Insufficient documentation

## 2015-07-03 DIAGNOSIS — K219 Gastro-esophageal reflux disease without esophagitis: Secondary | ICD-10-CM | POA: Insufficient documentation

## 2015-07-03 DIAGNOSIS — Z720 Tobacco use: Secondary | ICD-10-CM | POA: Insufficient documentation

## 2015-07-03 DIAGNOSIS — E785 Hyperlipidemia, unspecified: Secondary | ICD-10-CM | POA: Insufficient documentation

## 2015-07-03 DIAGNOSIS — Z9981 Dependence on supplemental oxygen: Secondary | ICD-10-CM | POA: Insufficient documentation

## 2015-07-03 DIAGNOSIS — I708 Atherosclerosis of other arteries: Secondary | ICD-10-CM | POA: Insufficient documentation

## 2015-07-03 DIAGNOSIS — Z951 Presence of aortocoronary bypass graft: Secondary | ICD-10-CM | POA: Insufficient documentation

## 2015-07-03 DIAGNOSIS — Z7982 Long term (current) use of aspirin: Secondary | ICD-10-CM | POA: Insufficient documentation

## 2015-07-03 DIAGNOSIS — G8929 Other chronic pain: Secondary | ICD-10-CM | POA: Insufficient documentation

## 2015-07-03 DIAGNOSIS — N189 Chronic kidney disease, unspecified: Secondary | ICD-10-CM | POA: Insufficient documentation

## 2015-07-03 DIAGNOSIS — Z9861 Coronary angioplasty status: Secondary | ICD-10-CM | POA: Insufficient documentation

## 2015-07-03 DIAGNOSIS — I340 Nonrheumatic mitral (valve) insufficiency: Secondary | ICD-10-CM | POA: Insufficient documentation

## 2015-07-03 LAB — CBC
HEMATOCRIT: 31.9 % — AB (ref 36.0–46.0)
HEMOGLOBIN: 10.6 g/dL — AB (ref 12.0–15.0)
MCH: 30.6 pg (ref 26.0–34.0)
MCHC: 33.2 g/dL (ref 30.0–36.0)
MCV: 92.2 fL (ref 78.0–100.0)
Platelets: 377 10*3/uL (ref 150–400)
RBC: 3.46 MIL/uL — AB (ref 3.87–5.11)
RDW: 15.1 % (ref 11.5–15.5)
WBC: 11.3 10*3/uL — ABNORMAL HIGH (ref 4.0–10.5)

## 2015-07-03 NOTE — ED Notes (Signed)
Pt. reports left chest pain " burning " with SOB , denies nausea or diaphoresis onset this week  , her cardiologist is Dr. Burt Knack , history of CAD/CABG / Coronary stents .

## 2015-07-04 ENCOUNTER — Observation Stay (HOSPITAL_COMMUNITY)
Admission: EM | Admit: 2015-07-04 | Discharge: 2015-07-04 | Disposition: A | Discharge status: Home / Self Care | Payer: Medicare Other | Attending: Internal Medicine | Admitting: Internal Medicine

## 2015-07-04 ENCOUNTER — Observation Stay (HOSPITAL_COMMUNITY): Payer: Medicare Other

## 2015-07-04 ENCOUNTER — Encounter (HOSPITAL_COMMUNITY): Payer: Self-pay | Admitting: Family Medicine

## 2015-07-04 ENCOUNTER — Emergency Department (HOSPITAL_COMMUNITY): Payer: Medicare Other

## 2015-07-04 DIAGNOSIS — N184 Chronic kidney disease, stage 4 (severe): Secondary | ICD-10-CM | POA: Diagnosis present

## 2015-07-04 DIAGNOSIS — D649 Anemia, unspecified: Secondary | ICD-10-CM | POA: Diagnosis present

## 2015-07-04 DIAGNOSIS — N189 Chronic kidney disease, unspecified: Secondary | ICD-10-CM | POA: Diagnosis not present

## 2015-07-04 DIAGNOSIS — Z9861 Coronary angioplasty status: Secondary | ICD-10-CM

## 2015-07-04 DIAGNOSIS — N179 Acute kidney failure, unspecified: Secondary | ICD-10-CM | POA: Diagnosis present

## 2015-07-04 DIAGNOSIS — J410 Simple chronic bronchitis: Secondary | ICD-10-CM

## 2015-07-04 DIAGNOSIS — F17200 Nicotine dependence, unspecified, uncomplicated: Secondary | ICD-10-CM | POA: Diagnosis present

## 2015-07-04 DIAGNOSIS — Z8679 Personal history of other diseases of the circulatory system: Secondary | ICD-10-CM

## 2015-07-04 DIAGNOSIS — R079 Chest pain, unspecified: Principal | ICD-10-CM | POA: Diagnosis present

## 2015-07-04 DIAGNOSIS — K219 Gastro-esophageal reflux disease without esophagitis: Secondary | ICD-10-CM | POA: Diagnosis present

## 2015-07-04 DIAGNOSIS — I10 Essential (primary) hypertension: Secondary | ICD-10-CM | POA: Diagnosis present

## 2015-07-04 DIAGNOSIS — J4489 Other specified chronic obstructive pulmonary disease: Secondary | ICD-10-CM | POA: Diagnosis present

## 2015-07-04 DIAGNOSIS — J449 Chronic obstructive pulmonary disease, unspecified: Secondary | ICD-10-CM | POA: Diagnosis present

## 2015-07-04 DIAGNOSIS — D509 Iron deficiency anemia, unspecified: Secondary | ICD-10-CM

## 2015-07-04 DIAGNOSIS — I5032 Chronic diastolic (congestive) heart failure: Secondary | ICD-10-CM | POA: Diagnosis not present

## 2015-07-04 LAB — BASIC METABOLIC PANEL
ANION GAP: 10 (ref 5–15)
Anion gap: 14 (ref 5–15)
BUN: 14 mg/dL (ref 6–20)
BUN: 14 mg/dL (ref 6–20)
CALCIUM: 8.7 mg/dL — AB (ref 8.9–10.3)
CO2: 25 mmol/L (ref 22–32)
CO2: 26 mmol/L (ref 22–32)
Calcium: 9.8 mg/dL (ref 8.9–10.3)
Chloride: 100 mmol/L — ABNORMAL LOW (ref 101–111)
Chloride: 102 mmol/L (ref 101–111)
Creatinine, Ser: 1.89 mg/dL — ABNORMAL HIGH (ref 0.44–1.00)
Creatinine, Ser: 2.04 mg/dL — ABNORMAL HIGH (ref 0.44–1.00)
GFR calc Af Amer: 29 mL/min — ABNORMAL LOW (ref >60)
GFR calc non Af Amer: 27 mL/min — ABNORMAL LOW (ref >60)
GFR, EST AFRICAN AMERICAN: 32 mL/min — AB (ref >60)
GFR, EST NON AFRICAN AMERICAN: 25 mL/min — AB (ref >60)
Glucose, Bld: 101 mg/dL — ABNORMAL HIGH (ref 65–99)
Glucose, Bld: 206 mg/dL — ABNORMAL HIGH (ref 65–99)
POTASSIUM: 4 mmol/L (ref 3.5–5.1)
POTASSIUM: 4.1 mmol/L (ref 3.5–5.1)
SODIUM: 136 mmol/L (ref 135–145)
SODIUM: 141 mmol/L (ref 135–145)

## 2015-07-04 LAB — GLUCOSE, CAPILLARY
GLUCOSE-CAPILLARY: 116 mg/dL — AB (ref 65–99)
GLUCOSE-CAPILLARY: 118 mg/dL — AB (ref 65–99)

## 2015-07-04 LAB — CREATININE, URINE, RANDOM: CREATININE, URINE: 47.89 mg/dL

## 2015-07-04 LAB — PROTIME-INR
INR: 0.97 (ref 0.00–1.49)
PROTHROMBIN TIME: 13.1 s (ref 11.6–15.2)

## 2015-07-04 LAB — I-STAT TROPONIN, ED: TROPONIN I, POC: 0.02 ng/mL (ref 0.00–0.08)

## 2015-07-04 LAB — TROPONIN I
TROPONIN I: 0.03 ng/mL (ref <0.031)
Troponin I: 0.03 ng/mL (ref <0.031)
Troponin I: 0.03 ng/mL (ref <0.031)

## 2015-07-04 MED ORDER — ACETAMINOPHEN 325 MG PO TABS: 650.0000 mg | ORAL_TABLET | ORAL | Status: DC | PRN

## 2015-07-04 MED ORDER — IPRATROPIUM BROMIDE 0.02 % IN SOLN
0.5000 mg | Freq: Once | RESPIRATORY_TRACT | Status: AC
  Administered 2015-07-04: 0.5 mg via RESPIRATORY_TRACT
  Filled 2015-07-04: qty 2.5

## 2015-07-04 MED ORDER — GI COCKTAIL ~~LOC~~
30.0000 mL | Freq: Four times a day (QID) | ORAL | Status: DC | PRN
  Administered 2015-07-04: 30 mL via ORAL
  Filled 2015-07-04: qty 30

## 2015-07-04 MED ORDER — FUROSEMIDE 20 MG PO TABS
20.0000 mg | ORAL_TABLET | Freq: Every day | ORAL | Status: DC
  Administered 2015-07-04: 20 mg via ORAL
  Filled 2015-07-04: qty 1

## 2015-07-04 MED ORDER — PRAVASTATIN SODIUM 40 MG PO TABS
80.0000 mg | ORAL_TABLET | Freq: Every day | ORAL | Status: DC
  Administered 2015-07-04: 80 mg via ORAL
  Filled 2015-07-04: qty 4

## 2015-07-04 MED ORDER — GLIPIZIDE ER 5 MG PO TB24
5.0000 mg | ORAL_TABLET | Freq: Every day | ORAL | Status: DC
  Administered 2015-07-04: 5 mg via ORAL
  Filled 2015-07-04 (×2): qty 1

## 2015-07-04 MED ORDER — MORPHINE SULFATE (PF) 2 MG/ML IV SOLN
2.0000 mg | INTRAVENOUS | Status: DC | PRN
  Administered 2015-07-04: 2 mg via INTRAVENOUS
  Filled 2015-07-04: qty 1

## 2015-07-04 MED ORDER — ZOLPIDEM TARTRATE 5 MG PO TABS: 5.0000 mg | ORAL_TABLET | Freq: Every day | ORAL | Status: DC

## 2015-07-04 MED ORDER — PANTOPRAZOLE SODIUM 40 MG PO TBEC
40.0000 mg | DELAYED_RELEASE_TABLET | Freq: Every day | ORAL | Status: DC
Start: 2015-07-04 — End: 2015-07-04
  Administered 2015-07-04: 40 mg via ORAL
  Filled 2015-07-04: qty 1

## 2015-07-04 MED ORDER — FERROUS GLUCONATE 324 (38 FE) MG PO TABS
325.0000 mg | ORAL_TABLET | Freq: Two times a day (BID) | ORAL | Status: DC
  Administered 2015-07-04: 325 mg via ORAL
  Filled 2015-07-04 (×2): qty 1

## 2015-07-04 MED ORDER — CLONIDINE HCL 0.2 MG PO TABS
0.2000 mg | ORAL_TABLET | Freq: Two times a day (BID) | ORAL | Status: DC
  Administered 2015-07-04: 0.2 mg via ORAL
  Filled 2015-07-04: qty 1

## 2015-07-04 MED ORDER — SODIUM CHLORIDE 0.9 % IV BOLUS (SEPSIS)
1000.0000 mL | Freq: Once | INTRAVENOUS | Status: AC
  Administered 2015-07-04: 1000 mL via INTRAVENOUS

## 2015-07-04 MED ORDER — MORPHINE SULFATE (PF) 4 MG/ML IV SOLN
4.0000 mg | Freq: Once | INTRAVENOUS | Status: AC
  Administered 2015-07-04: 4 mg via INTRAVENOUS
  Filled 2015-07-04: qty 1

## 2015-07-04 MED ORDER — ASPIRIN 81 MG PO CHEW
81.0000 mg | CHEWABLE_TABLET | Freq: Every day | ORAL | Status: DC
  Administered 2015-07-04: 81 mg via ORAL
  Filled 2015-07-04: qty 1

## 2015-07-04 MED ORDER — HYDRALAZINE HCL 50 MG PO TABS
100.0000 mg | ORAL_TABLET | Freq: Three times a day (TID) | ORAL | Status: DC
  Administered 2015-07-04: 100 mg via ORAL
  Filled 2015-07-04: qty 2

## 2015-07-04 MED ORDER — CARVEDILOL 25 MG PO TABS
25.0000 mg | ORAL_TABLET | Freq: Two times a day (BID) | ORAL | Status: DC
  Administered 2015-07-04: 25 mg via ORAL
  Filled 2015-07-04: qty 1

## 2015-07-04 MED ORDER — ONDANSETRON HCL 4 MG/2ML IJ SOLN: 4.0000 mg | Freq: Four times a day (QID) | INTRAMUSCULAR | Status: DC | PRN

## 2015-07-04 MED ORDER — AMLODIPINE BESYLATE 10 MG PO TABS
10.0000 mg | ORAL_TABLET | Freq: Every day | ORAL | Status: DC
  Administered 2015-07-04: 10 mg via ORAL
  Filled 2015-07-04: qty 1

## 2015-07-04 MED ORDER — CLOPIDOGREL BISULFATE 75 MG PO TABS
75.0000 mg | ORAL_TABLET | Freq: Every day | ORAL | Status: DC
  Administered 2015-07-04: 75 mg via ORAL
  Filled 2015-07-04: qty 1

## 2015-07-04 NOTE — ED Provider Notes (Signed)
History  By signing my name below, I, Marlowe Kays, attest that this documentation has been prepared under the direction and in the presence of Veryl Speak, MD. Electronically Signed: Marlowe Kays, ED Scribe. 07/04/2015. 2:03 AM   Chief Complaint  Patient presents with  . Chest Pain   Patient is a 64 y.o. female presenting with chest pain. The history is provided by the patient and medical records. No language interpreter was used.  Chest Pain Pain location:  L chest Pain quality: burning   Pain radiates to the back: yes   Duration:  10 days Timing:  Constant Progression:  Waxing and waning   HPI Comments:  DALAYLA ARLINGHAUS is a 63 y.o. female who presents to the Emergency Department complaining of left-sided, burning, waxing and waning CP that began several days ago. She states ten days ago she received a subclavian stent (Dr. Domingo Sep) and nine days ago she received a cardiac stent (Dr. Burt Knack). She reports that the pain has been getting progressively worse after cooking dinner earlier today. She states the pain has been radiating into her back. She states this feels different than any pain she has had in the past. She reports associated palpitations that have been ongoing since being hospitalized 10 days ago. She denies modifying factors. She denies nausea or leg swelling. She denies anticoagulant therapy but reports taking Plavix and ASA.  Past Medical History  Diagnosis Date  . Coronary artery disease 5/09    s/p CABG 7/12   L-LAD, S-OM, S-PDA  . PAD (peripheral artery disease) (HCC)     Severe; s/p bilateral renal artery stents, moderate in-stent restenosis  . AAA (abdominal aortic aneurysm) ([place]) 5/09    3.7x3.3 by u/s 2009  . carotid stenosis 5/09    S/p L CEA ;  60-79% bilat ICA by preCABG dopplers 7/12  . Hypertension   . Hyperlipidemia   . COPD (chronic obstructive pulmonary disease) ([place])   . GERD (gastroesophageal reflux disease)     With hiatal hernia  .  Paroxysmal atrial fibrillation (HCC)     coumadin;  echo 9/07: EF 60%, mild LVH;  s/p Cox Maze 7/12 with LAA clipping  . GIB (gastrointestinal bleeding) 9/11    S/p EGD with cautery at [facility]  . Abnormal EKG     deep TW inversions chronic  . CKD (chronic kidney disease)   . Mitral regurgitation     3+ MR by intraoperative TEE;  s/p MV repair with Dr. Roxy Manns 7/12  . Anemia   . CHF (congestive heart failure) ([place])   . Complication of anesthesia 1966    "problem w/ether"  . Heart murmur   . CAD (coronary artery disease)     a. CABG 03/2011 LIMA to LAD, SVG to OM, SVG to PDA. b. cath 06/25/2015 DES to SVG to rPDA, patent LIMA to LAD, patent SVG to OM  . Myocardial infarction [facility]) 2012 "several"  . Chronic bronchitis ([place])     "get it pretty much q yr" (06/24/2015)  . On home oxygen therapy     "2 liters at night; negative for sleep apnea"  . Type II diabetes mellitus ([place])   . History of blood transfusion "a few times"    "all related to anemia" (06/24/2015)  . History of hiatal hernia   . Chronic back pain     "all over" (06/24/2015)  . Depression   . Subclavian artery stenosis, left     stented by Dr. Trula Slade on 10/18 to  help flow of her LIMA to LAD   Past Surgical History  Procedure Laterality Date  . Carotid endarterectomy Left   . Laceration repair Right 1990's    WRIST  . Right femoral-popliteal bypass    . Coronary artery bypass graft  03/05/2011    CABG X3 (LIMA to LAD, SVG to OM, SVG to PDA, EVH via left thigh  . Mitral valve repair  03/05/2011    69mm Memo 3D ring annuloplasty for ischemic MR  . Maze  03/05/2011    complete biatrial lesion set with clipping of LA appendage  . Bowel resection  2013  . Renal angiogram Bilateral 09/11/2013    Procedure: RENAL ANGIOGRAM;  Surgeon: Serafina Mitchell, MD;  Location: [facility] CATH LAB;  Service: Cardiovascular;  Laterality: Bilateral;  . Peripheral vascular catheterization N/A 06/24/2015    Procedure: Aortic Arch Angiography;  Surgeon:  Serafina Mitchell, MD;  Location: [place] CV LAB;  Service: Cardiovascular;  Laterality: N/A;  . Peripheral vascular catheterization  06/24/2015    Procedure: Peripheral Vascular Intervention;  Surgeon: Serafina Mitchell, MD;  Location: [place] CV LAB;  Service: Cardiovascular;;  . Peripheral vascular catheterization N/A 06/24/2015    Procedure: Abdominal Aortogram;  Surgeon: Serafina Mitchell, MD;  Location: [place] CV LAB;  Service: Cardiovascular;  Laterality: N/A;  . Cardiac catheterization N/A 06/24/2015    Procedure: Left Heart Cath and Cors/Grafts Angiography;  Surgeon: Leonie Man, MD;  Location: [place] CV LAB;  Service: Cardiovascular;  Laterality: N/A;  . Appendectomy  Aug. 11, 2016    Ruptured  . Abdominal hysterectomy  1990's  . Coronary angioplasty    . Cardiac catheterization N/A 06/25/2015    Procedure: Coronary Stent Intervention;  Surgeon: Sherren Mocha, MD;  Location: [place] CV LAB;  Service: Cardiovascular;  Laterality: N/A;  . Coronary stent placement     Family History  Problem Relation Age of Onset  . Emphysema Mother   . Heart disease Mother     before age 34  . Hypertension Mother   . Hyperlipidemia Mother   . Heart attack Mother   . Heart attack Father 77  . Emphysema Father   . Heart disease Father     before age 39  . Hyperlipidemia Father   . Hypertension Father   . Peripheral vascular disease Father   . Heart disease Other     Vascular disease in grandparents, uncles and dad  . Diabetes Brother   . Heart disease Brother     before age 34  . Hyperlipidemia Brother   . Hypertension Brother   . Heart attack Brother     CABG  . AAA (abdominal aortic aneurysm) Mother     rupture   Social History  Substance Use Topics  . Smoking status: Current Every Day Smoker -- 0.00 packs/day for 50 years    Types: Cigarettes  . Smokeless tobacco: Never Used  . Alcohol Use: Yes   OB History    No data available     Review of Systems   Cardiovascular: Positive for chest pain.  All other systems reviewed and are negative.   Allergies  Imdur; Codeine; Contrast media; Ioxaglate; Metrizamide; and Percocet  Home Medications   Prior to Admission medications   Medication Sig Start Date End Date Taking? Authorizing Provider  albuterol (PROVENTIL HFA;VENTOLIN HFA) 108 (90 BASE) MCG/ACT inhaler Inhale 2 puffs into the lungs every 4 (four) hours as needed for wheezing or shortness of breath.  ProAir   Yes Historical Provider, MD  amLODipine (NORVASC) 10 MG tablet Take 10 mg by mouth daily.     Yes Historical Provider, MD  aspirin 81 MG chewable tablet Chew 1 tablet (81 mg total) by mouth daily. 06/26/15  Yes Almyra Deforest, PA  Aspirin-Caffeine 845-65 MG PACK Take 1 packet by mouth daily as needed (aches and pains).   Yes Historical Provider, MD  budesonide-formoterol (SYMBICORT) 160-4.5 MCG/ACT inhaler Inhale 2 puffs into the lungs 2 (two) times daily.  06/11/15 06/10/16 Yes Historical Provider, MD  carvedilol (COREG) 25 MG tablet Take 1 tablet (25 mg total) by mouth 2 (two) times daily with a meal. 10/21/14  Yes Burtis Junes, NP  cloNIDine (CATAPRES) 0.2 MG tablet Take 0.2 mg by mouth 2 (two) times daily.   Yes Historical Provider, MD  clopidogrel (PLAVIX) 75 MG tablet Take 1 tablet (75 mg total) by mouth daily with breakfast. 06/26/15  Yes Almyra Deforest, PA  ferrous gluconate (FERGON) 325 MG tablet Take 325 mg by mouth 2 (two) times daily.    Yes Historical Provider, MD  furosemide (LASIX) 20 MG tablet Take 1 tablet (20 mg total) by mouth daily. 02/24/15  Yes Scott T Kathlen Mody, PA-C  glipiZIDE (GLUCOTROL XL) 5 MG 24 hr tablet Take 5 mg by mouth daily with breakfast.   Yes Historical Provider, MD  hydrALAZINE (APRESOLINE) 100 MG tablet TAKE 1 TABLET BY MOUTH THREE TIMES DAILY Patient taking differently: TAKE 100 MG BY MOUTH THREE TIMES DAILY 12/25/13  Yes Sherren Mocha, MD  HYDROcodone-acetaminophen (NORCO/VICODIN) 5-325 MG per tablet Take 1  tablet by mouth 3 (three) times daily as needed for moderate pain.    Yes Historical Provider, MD  pantoprazole (PROTONIX) 40 MG tablet Take 1 tablet (40 mg total) by mouth daily. 06/26/15  Yes Almyra Deforest, PA  pravastatin (PRAVACHOL) 80 MG tablet TAKE 1 TABLET BY MOUTH DAILY Patient taking differently: TAKE 80 MG BY MOUTH DAILY 10/09/12  Yes Jolaine Artist, MD  zolpidem (AMBIEN) 10 MG tablet Take 10 mg by mouth at bedtime. 05/07/15 05/06/16 Yes Historical Provider, MD   Triage Vitals: BP 209/57 mmHg  Pulse 78  Temp(Src) 98.7 F (37.1 C) (Oral)  Resp 20  SpO2 96% Physical Exam  Constitutional: She is oriented to person, place, and time. She appears well-developed and well-nourished. No distress.  HENT:  Head: Normocephalic and atraumatic.  Eyes: EOM are normal.  Neck: Normal range of motion.  Cardiovascular: Normal rate, regular rhythm and normal heart sounds.   Pulmonary/Chest: Effort normal and breath sounds normal.  Abdominal: Soft. She exhibits no distension. There is no tenderness.  Musculoskeletal: Normal range of motion. She exhibits no edema.  No calf tenderness. Bevelyn Buckles' sign absent. No edema.  Neurological: She is alert and oriented to person, place, and time.  Skin: Skin is warm and dry.  Psychiatric: She has a normal mood and affect. Judgment normal.  Nursing note and vitals reviewed.   ED Course  Procedures (including critical care time) DIAGNOSTIC STUDIES: Oxygen Saturation is 96% on RA, adequate by my interpretation.   COORDINATION OF CARE: 1:02 AM- Will order CXR and labs. Pt verbalizes understanding and agrees to plan.  Medications - No data to display  Labs Review Labs Reviewed  BASIC METABOLIC PANEL - Abnormal; Notable for the following:    Glucose, Bld 101 (*)    Creatinine, Ser 2.04 (*)    GFR calc non Af Amer 25 (*)    GFR calc Af Wyvonnia Lora  29 (*)    All other components within normal limits  CBC - Abnormal; Notable for the following:    WBC 11.3 (*)     RBC 3.46 (*)    Hemoglobin 10.6 (*)    HCT 31.9 (*)    All other components within normal limits  PROTIME-INR  Randolm Idol, ED    Imaging Review Dg Chest 2 View  07/04/2015  CLINICAL DATA:  64 year old female with left-sided burning chest pain. EXAM: CHEST  2 VIEW COMPARISON:  Chest x-ray 06/11/2015. FINDINGS: Mild diffuse peribronchial cuffing. Lung volumes are normal. No consolidative airspace disease. No pleural effusions. No pneumothorax. No pulmonary nodule or mass noted. Pulmonary vasculature and the cardiomediastinal silhouette are within normal limits. Atherosclerosis in the thoracic aorta. Status post median sternotomy for CABG, mitral annuloplasty and left atrial appendage ligation. IMPRESSION: 1. Mild diffuse peribronchial cuffing, suggestive of acute bronchitis. 2. Atherosclerosis. 3. Postoperative changes, as above. Electronically Signed   By: Vinnie Langton M.D.   On: 07/04/2015 00:34   I have personally reviewed and evaluated these images and lab results as part of my medical decision-making.   EKG Interpretation   Date/Time:  Thursday July 03 2015 23:24:05 EDT Ventricular Rate:  76 PR Interval:  128 QRS Duration: 112 QT Interval:  388 QTC Calculation: 436 R Axis:   78 Text Interpretation:  Sinus rhythm with Premature supraventricular  complexes Right bundle branch block T wave abnormality, consider  inferolateral ischemia Abnormal ECG Confirmed by Demaris Leavell  MD, Brewer Hitchman ([id number])  on 07/04/2015 2:38:11 AM      MDM   Final diagnoses:  None    Patient is a 64 year old female with long-standing history of peripheral vascular and coronary artery disease. She presents for evaluation of chest discomfort. She recently underwent a subclavian stent along with angioplasty to her heart last week. She has been having intermittent discomfort which has worsened over the past 2 days. Her EKG reveals ST segment inversions in the lateral leads which is consistent with prior  ECGs. Her troponin is negative.  I've discussed this with Dr. Rosendo Gros from cardiology who is recommending admission to the hospitalist service. I've spoken with Dr. Loleta Books who agrees to admit.  I personally performed the services described in this documentation, which was scribed in my presence. The recorded information has been reviewed and is accurate.      Veryl Speak, MD 07/04/15 [phone]

## 2015-07-04 NOTE — Discharge Instructions (Signed)
Follow with Primary MD BOUSKA,DAVID E, MD in 7 days   Get CBC, CMP, 2 view Chest X ray checked  by Primary MD next visit.    Activity: As tolerated with Full fall precautions use walker/cane & assistance as needed   Disposition Home    Diet: Heart Healthy  , with feeding assistance and aspiration precautions.  For Heart failure patients - Check your Weight same time everyday, if you gain over 2 pounds, or you develop in leg swelling, experience more shortness of breath or chest pain, call your Primary MD immediately. Follow Cardiac Low Salt Diet and 1.5 lit/day fluid restriction.   On your next visit with your primary care physician please Get Medicines reviewed and adjusted.   Please request your Prim.MD to go over all Hospital Tests and Procedure/Radiological results at the follow up, please get all Hospital records sent to your Prim MD by signing hospital release before you go home.   If you experience worsening of your admission symptoms, develop shortness of breath, life threatening emergency, suicidal or homicidal thoughts you must seek medical attention immediately by calling 911 or calling your MD immediately  if symptoms less severe.  You Must read complete instructions/literature along with all the possible adverse reactions/side effects for all the Medicines you take and that have been prescribed to you. Take any new Medicines after you have completely understood and accpet all the possible adverse reactions/side effects.   Do not drive, operating heavy machinery, perform activities at heights, swimming or participation in water activities or provide baby sitting services if your were admitted for syncope or siezures until you have seen by Primary MD or a Neurologist and advised to do so again.  Do not drive when taking Pain medications.    Do not take more than prescribed Pain, Sleep and Anxiety Medications  Special Instructions: If you have smoked or chewed Tobacco  in  the last 2 yrs please stop smoking, stop any regular Alcohol  and or any Recreational drug use.  Wear Seat belts while driving.   Please note  You were cared for by a hospitalist during your hospital stay. If you have any questions about your discharge medications or the care you received while you were in the hospital after you are discharged, you can call the unit and asked to speak with the hospitalist on call if the hospitalist that took care of you is not available. Once you are discharged, your primary care physician will handle any further medical issues. Please note that NO REFILLS for any discharge medications will be authorized once you are discharged, as it is imperative that you return to your primary care physician (or establish a relationship with a primary care physician if you do not have one) for your aftercare needs so that they can reassess your need for medications and monitor your lab values.

## 2015-07-04 NOTE — ED Notes (Signed)
MD at bedside. 

## 2015-07-04 NOTE — Consult Note (Signed)
Patient ID: Amy GENAO MRN: [id number], DOB/AGE: Jan 23, 1951   Admit date: 07/04/2015   Primary Physician: Phineas Inches, MD Primary Cardiologist: Dr. Burt Knack  Pt. Profile:  64 y/o female s/p recent left subclavian artery stenting followed by staged  06/25/15 readmitted with atypical chest pain and AKI.   Problem List  Past Medical History  Diagnosis Date  . Coronary artery disease 5/09    s/p CABG 7/12   L-LAD, S-OM, S-PDA  . PAD (peripheral artery disease) (HCC)     Severe; s/p bilateral renal artery stents, moderate in-stent restenosis  . AAA (abdominal aortic aneurysm) ([facility]) 5/09    3.7x3.3 by u/s 2009  . carotid stenosis 5/09    S/p L CEA ;  60-79% bilat ICA by preCABG dopplers 7/12  . Hypertension   . Hyperlipidemia   . COPD (chronic obstructive pulmonary disease) ([facility])   . GERD (gastroesophageal reflux disease)     With hiatal hernia  . Paroxysmal atrial fibrillation (HCC)     coumadin;  echo 9/07: EF 60%, mild LVH;  s/p Cox Maze 7/12 with LAA clipping  . GIB (gastrointestinal bleeding) 9/11    S/p EGD with cautery at [facility]  . Abnormal EKG     deep TW inversions chronic  . CKD (chronic kidney disease)   . Mitral regurgitation     3+ MR by intraoperative TEE;  s/p MV repair with Dr. Roxy Manns 7/12  . Anemia   . CHF (congestive heart failure) ([place])   . Complication of anesthesia 1966    "problem w/ether"  . Heart murmur   . CAD (coronary artery disease)     a. CABG 03/2011 LIMA to LAD, SVG to OM, SVG to PDA. b. cath 06/25/2015 DES to SVG to rPDA, patent LIMA to LAD, patent SVG to OM  . Myocardial infarction [facility]) 2012 "several"  . Chronic bronchitis ([place])     "get it pretty much q yr" (06/24/2015)  . On home oxygen therapy     "2 liters at night; negative for sleep apnea"  . Type II diabetes mellitus ([place])   . History of blood transfusion "a few times"    "all related to anemia" (06/24/2015)  . History of hiatal hernia   . Chronic back pain     "all over"  (06/24/2015)  . Depression   . Subclavian artery stenosis, left     stented by Dr. Trula Slade on 10/18 to help flow of her LIMA to LAD    Past Surgical History  Procedure Laterality Date  . Carotid endarterectomy Left   . Laceration repair Right 1990's    WRIST  . Right femoral-popliteal bypass    . Coronary artery bypass graft  03/05/2011    CABG X3 (LIMA to LAD, SVG to OM, SVG to PDA, EVH via left thigh  . Mitral valve repair  03/05/2011    61mm Memo 3D ring annuloplasty for ischemic MR  . Maze  03/05/2011    complete biatrial lesion set with clipping of LA appendage  . Bowel resection  2013  . Renal angiogram Bilateral 09/11/2013    Procedure: RENAL ANGIOGRAM;  Surgeon: Serafina Mitchell, MD;  Location: [facility] CATH LAB;  Service: Cardiovascular;  Laterality: Bilateral;  . Peripheral vascular catheterization N/A 06/24/2015    Procedure: Aortic Arch Angiography;  Surgeon: Serafina Mitchell, MD;  Location: [facility] CV LAB;  Service: Cardiovascular;  Laterality: N/A;  . Peripheral vascular catheterization  06/24/2015    Procedure: Peripheral Vascular  Intervention;  Surgeon: Serafina Mitchell, MD;  Location: [facility] LAB;  Service: Cardiovascular;;  . Peripheral vascular catheterization N/A 06/24/2015    Procedure: Abdominal Aortogram;  Surgeon: Serafina Mitchell, MD;  Location: [place] CV LAB;  Service: Cardiovascular;  Laterality: N/A;  . Cardiac catheterization N/A 06/24/2015    Procedure: Left Heart Cath and Cors/Grafts Angiography;  Surgeon: Leonie Man, MD;  Location: [facility] CV LAB;  Service: Cardiovascular;  Laterality: N/A;  . Appendectomy  Aug. 11, 2016    Ruptured  . Abdominal hysterectomy  1990's  . Coronary angioplasty    . Cardiac catheterization N/A 06/25/2015    Procedure: Coronary Stent Intervention;  Surgeon: Sherren Mocha, MD;  Location: [facility] CV LAB;  Service: Cardiovascular;  Laterality: N/A;  . Coronary stent placement       Allergies  Allergies    Allergen Reactions  . Imdur [Isosorbide Dinitrate] Other (See Comments)    "felt like my insides were going to explode"  . Codeine Itching  . Contrast Media [Iodinated Diagnostic Agents] Itching and Other (See Comments)    Itching of feet  . Ioxaglate Itching and Other (See Comments)    Itching of feet  . Metrizamide Itching and Other (See Comments)    Itching of feet  . Percocet [Oxycodone-Acetaminophen] Itching and Other (See Comments)    Tolerates Hydrocodone    HPI  64 yo female with PMH of CAD s/p CABG 03/2011 with LIMA to LAD, SVG to OM, and SVG to PDA, PAD, HTN, HLD, DM, PAF s/p Cox Maze 7/12 with LAA clipping, CKD stage 3 and h/o MR s/p MV repair 03/2011. Patient's mild annuloplasty ring was well seated with mild mitral stenosis and trivial regurgitation. she was hospitalized at [facility] in August with a ruptured appendix and had laprascopic appendectomy. She was also noted to have a pelvic abscess. Postoperatively, she had elevated troponin and was seen by cardiology. Recommended she undergo treatment for left subclavian stenosis since she had LIMA to LAD graft. She was seen by vascular surgery, Dr. [name] on 06/10/2015 who recommended arteriogram with stenting of left subclavian artery. Patient was also noted to have some anginal symptoms. After discussing with Dr. Burt Knack, he recommended coronary angiography at same time as arterial angiogram. The case was coordinated with vascular surgery and the patient eventually presented for the procedure on 06/24/2015. Coronary angiography showed 85% ost RCA pesion, 55% prox RCA lesion, 90% prox SVG to RCA, 100% ost LM and mid LAD, LIMA could not be visualized, staged PCI was planned. Vascular surgery performed angiography at the same time as coronary angiography 10/18, 80% L subclavian artery stenosis treated with 8x18 balloon expandable stent with <10% residual.   Patient was brought back to the cath lab on 10/19 for staged PCI, she  underwent successful PCI of SVG to right PDA with DES (3.0x12 mm Promus DES, posted dilated to 3.22mm) using distal embolic protection with a Spider device, patent LIMA to LAD and patent SVG to OM. She was discharged on 06/26/15. SCr day of discharge was 1.43.  She presented back to [facility] a week later on 07/04/15 with a complaint of CP, although different from her previous angina.  Her EKG reveals ST segment inversions in the lateral leads which is consistent with prior ECGs.  POC troponin in the ED was negative. Actual lab troponin is negative x 1 with serial enzymes pending. Also notable is AKI with rise in SCr up to 2.04 (1.43 day  of discharge). IM medicine has admitted for CP observation and AKI. She was not on a ACE/ARB as an outpatient. Also with a mild leukocytosis with WBC at 11.3 afebrile. She has also been HTN since admission with SBP ranging from [id number] systolic. Coreg, clonidine and hydralazine ordered.   She describes intermittent left sided burning also with features c/w musculoskeletal etiology. She notes pain is improved after rubbing her chest wall, holding her left breast and with changes in certain positions.    Home Medications  Prior to Admission medications   Medication Sig Start Date End Date Taking? Authorizing Provider  albuterol (PROVENTIL HFA;VENTOLIN HFA) 108 (90 BASE) MCG/ACT inhaler Inhale 2 puffs into the lungs every 4 (four) hours as needed for wheezing or shortness of breath. ProAir   Yes Historical Provider, MD  amLODipine (NORVASC) 10 MG tablet Take 10 mg by mouth daily.     Yes Historical Provider, MD  aspirin 81 MG chewable tablet Chew 1 tablet (81 mg total) by mouth daily. 06/26/15  Yes Almyra Deforest, PA  Aspirin-Caffeine 845-65 MG PACK Take 1 packet by mouth daily as needed (aches and pains).   Yes Historical Provider, MD  budesonide-formoterol (SYMBICORT) 160-4.5 MCG/ACT inhaler Inhale 2 puffs into the lungs 2 (two) times daily.  06/11/15 06/10/16 Yes Historical  Provider, MD  carvedilol (COREG) 25 MG tablet Take 1 tablet (25 mg total) by mouth 2 (two) times daily with a meal. 10/21/14  Yes Burtis Junes, NP  cloNIDine (CATAPRES) 0.2 MG tablet Take 0.2 mg by mouth 2 (two) times daily.   Yes Historical Provider, MD  clopidogrel (PLAVIX) 75 MG tablet Take 1 tablet (75 mg total) by mouth daily with breakfast. 06/26/15  Yes Almyra Deforest, PA  ferrous gluconate (FERGON) 325 MG tablet Take 325 mg by mouth 2 (two) times daily.    Yes Historical Provider, MD  furosemide (LASIX) 20 MG tablet Take 1 tablet (20 mg total) by mouth daily. 02/24/15  Yes Scott T Kathlen Mody, PA-C  glipiZIDE (GLUCOTROL XL) 5 MG 24 hr tablet Take 5 mg by mouth daily with breakfast.   Yes Historical Provider, MD  hydrALAZINE (APRESOLINE) 100 MG tablet TAKE 1 TABLET BY MOUTH THREE TIMES DAILY Patient taking differently: TAKE 100 MG BY MOUTH THREE TIMES DAILY 12/25/13  Yes Sherren Mocha, MD  HYDROcodone-acetaminophen (NORCO/VICODIN) 5-325 MG per tablet Take 1 tablet by mouth 3 (three) times daily as needed for moderate pain.    Yes Historical Provider, MD  pantoprazole (PROTONIX) 40 MG tablet Take 1 tablet (40 mg total) by mouth daily. 06/26/15  Yes Almyra Deforest, PA  pravastatin (PRAVACHOL) 80 MG tablet TAKE 1 TABLET BY MOUTH DAILY Patient taking differently: TAKE 80 MG BY MOUTH DAILY 10/09/12  Yes Jolaine Artist, MD  zolpidem (AMBIEN) 10 MG tablet Take 10 mg by mouth at bedtime. 05/07/15 05/06/16 Yes Historical Provider, MD    Family History  Family History  Problem Relation Age of Onset  . Emphysema Mother   . Heart disease Mother     before age 23  . Hypertension Mother   . Hyperlipidemia Mother   . Heart attack Mother   . Heart attack Father 52  . Emphysema Father   . Heart disease Father     before age 34  . Hyperlipidemia Father   . Hypertension Father   . Peripheral vascular disease Father   . Heart disease Other     Vascular disease in grandparents, uncles and dad  . Diabetes  Brother   .  Heart disease Brother     before age 65  . Hyperlipidemia Brother   . Hypertension Brother   . Heart attack Brother     CABG  . AAA (abdominal aortic aneurysm) Mother     rupture    Social History  Social History   Social History  . Marital Status: Widowed    Spouse Name: N/A  . Number of Children: N/A  . Years of Education: N/A   Occupational History  . Cashier in past    Social History Main Topics  . Smoking status: Current Every Day Smoker -- 0.00 packs/day for 50 years    Types: Cigarettes  . Smokeless tobacco: Never Used  . Alcohol Use: Yes  . Drug Use: No  . Sexual Activity: Not on file   Other Topics Concern  . Not on file   Social History Narrative   Widowed in 2009.     Review of Systems General:  No chills, fever, night sweats or weight changes.  Cardiovascular:  + chest pain, no dyspnea on exertion, edema, orthopnea, palpitations, paroxysmal nocturnal dyspnea. Dermatological: No rash, lesions/masses Respiratory: No cough, dyspnea Urologic: No hematuria, dysuria Abdominal:   No nausea, vomiting, diarrhea, bright red blood per rectum, melena, or hematemesis Neurologic:  No visual changes, wkns, changes in mental status. All other systems reviewed and are otherwise negative except as noted above.  Physical Exam  Blood pressure 164/65, pulse 61, temperature 99.1 F (37.3 C), temperature source Oral, resp. rate 14, height 5\' 5"  (1.651 m), weight 142 lb 4.8 oz (64.547 kg), SpO2 96 %.  General: Pleasant, NAD Psych: Normal affect. Neuro: Alert and oriented X 3. Moves all extremities spontaneously. HEENT: Normal  Neck: Supple without bruits or JVD. Lungs:  Resp regular and unlabored, CTA. Chest Wall: mild tenderness over left anterior chest  Heart: RRR 2/6 SM at LUSB, 2/6 bruit under mid left clavicle  Abdomen: Soft, non-tender, non-distended, BS + x 4.  Extremities: No clubbing,  or edema. DP/PT/Radials 2+ and equal bilaterally. Left  anterior forearm with mild ecchymosis from recent cath.  Labs  Troponin [facility] of Care Test)  Recent Labs  07/03/15 2355  TROPIPOC 0.02    Recent Labs  07/04/15 0716  TROPONINI 0.03   Lab Results  Component Value Date   WBC 11.3* 07/03/2015   HGB 10.6* 07/03/2015   HCT 31.9* 07/03/2015   MCV 92.2 07/03/2015   PLT 377 07/03/2015    Recent Labs Lab 07/03/15 2351  NA 141  K 4.1  CL 102  CO2 25  BUN 14  CREATININE 2.04*  CALCIUM 9.8  GLUCOSE 101*   Lab Results  Component Value Date   CHOL 144 03/05/2011   HDL 17* 03/05/2011   LDLCALC 58 03/05/2011   TRIG 346* 03/05/2011   No results found for: DDIMER   Radiology/Studies  Dg Chest 2 View  07/04/2015  CLINICAL DATA:  64 year old female with left-sided burning chest pain. EXAM: CHEST  2 VIEW COMPARISON:  Chest x-ray 06/11/2015. FINDINGS: Mild diffuse peribronchial cuffing. Lung volumes are normal. No consolidative airspace disease. No pleural effusions. No pneumothorax. No pulmonary nodule or mass noted. Pulmonary vasculature and the cardiomediastinal silhouette are within normal limits. Atherosclerosis in the thoracic aorta. Status post median sternotomy for CABG, mitral annuloplasty and left atrial appendage ligation. IMPRESSION: 1. Mild diffuse peribronchial cuffing, suggestive of acute bronchitis. 2. Atherosclerosis. 3. Postoperative changes, as above. Electronically Signed   By: Vinnie Langton M.D.   On: 07/04/2015 00:34  ECG  ST segment inversions in the lateral leads which is consistent with prior ECGs.  ASSESSMENT AND PLAN  1. Chest Pain: her symptoms seem atypical and more c/w musculoskeletal etiology. She notes pain is improved after rubbing her chest wall, holding her left breast and with changes in certain positions. However agree with cycling enzymes. If negative, she will not need any further ischemic w/u.  2. AKI: in the setting of recent contrast dye exposure for left subclavian stent 10/18  and coronary stenting 10/19. She is not on a ACE/ARB. EF 06/2014 was normal at 55-60%. Recommend IV hydration. Would avoid NSAIDs for musculoskeletal pain.   3. Left Subclavian Artery Stenosis: s/p stenting 06/24/15. Continue ASA + Plavix.   4. CAD: s/p CABG 03/2011 with LIMA to LAD, SVG to OM, and SVG to PDA. S/p recent PCI of SVG to right PDA with DES. No anginal symptoms. Continue ASA, Plavix, BB and statin.    Signed, Lyda Jester, PA-C 07/04/2015, 10:12 AM Patient seen and examined and history reviewed. Agree with above findings and plan. 64 yo WF with recent stenting of SVG to RCA and stenting of left subclavian 9 days ago. 2 days later developed a constant burning pain in left precordium. No clear aggravating features. Feels better when she rubs it. Pain is constant for at least 5 days. Ecg is without change. Cardiac enzymes are normal. Creatinine has increased from prior (patient states this always happens after an angiogram). I think her pain is more musculoskeletal/neuropathic. Atypical for cardiac pain. No further work up needed. Would manage with analgesics- Tylenol or percocet.   Peter Martinique, [place] 07/04/2015 10:58 AM

## 2015-07-04 NOTE — ED Notes (Signed)
Family at bedside. 

## 2015-07-04 NOTE — Plan of Care (Signed)
Problem: Consults Goal: Tobacco Cessation referral if indicated Outcome: Not Applicable Date Met:  07/37/10 Importance of quitting smoking was stressed at time of admission. Patient has attempted to quit unsuccessfully numerous times in the past. Not currently trying to quit/ready to quit.

## 2015-07-04 NOTE — ED Notes (Signed)
[name] is not taking patient due to active chest pain, Dr Almeta Monas notified and is putting in new bed orders.

## 2015-07-04 NOTE — Discharge Summary (Signed)
[name], is a 64 y.o. female  DOB 11-19-1950  MRN [id number].  Admission date:  07/04/2015  Admitting Physician  Edwin Dada, MD  Discharge Date:  07/04/2015   Primary MD  Phineas Inches, MD  Recommendations for primary care physician for things to follow:  - Please check BMP during next visit to ensure stable renal function   Admission Diagnosis  Status post coronary angioplasty [Z98.61] History of coronary artery disease [Z86.79] Chest pain, unspecified chest pain type [R07.9]   Discharge Diagnosis  Status post coronary angioplasty [Z98.61] History of coronary artery disease [Z86.79] Chest pain, unspecified chest pain type [R07.9]    Principal Problem:   Acute on chronic kidney failure ([place]) Active Problems:   TOBACCO ABUSE   Essential hypertension   Chronic diastolic heart failure (HCC)   COPD (chronic obstructive pulmonary disease) (HCC)   Anemia   Chest pain   GERD (gastroesophageal reflux disease)      Past Medical History  Diagnosis Date  . Coronary artery disease 5/09    s/p CABG 7/12   L-LAD, S-OM, S-PDA  . PAD (peripheral artery disease) (HCC)     Severe; s/p bilateral renal artery stents, moderate in-stent restenosis  . AAA (abdominal aortic aneurysm) ([place]) 5/09    3.7x3.3 by u/s 2009  . carotid stenosis 5/09    S/p L CEA ;  60-79% bilat ICA by preCABG dopplers 7/12  . Hypertension   . Hyperlipidemia   . COPD (chronic obstructive pulmonary disease) ([place])   . GERD (gastroesophageal reflux disease)     With hiatal hernia  . Paroxysmal atrial fibrillation (HCC)     coumadin;  echo 9/07: EF 60%, mild LVH;  s/p Cox Maze 7/12 with LAA clipping  . GIB (gastrointestinal bleeding) 9/11    S/p EGD with cautery at [facility]  . Abnormal EKG     deep TW inversions chronic  . CKD (chronic kidney disease)   . Mitral regurgitation     3+ MR by intraoperative TEE;  s/p MV  repair with Dr. Roxy Manns 7/12  . Anemia   . CHF (congestive heart failure) ([place])   . Complication of anesthesia 1966    "problem w/ether"  . Heart murmur   . CAD (coronary artery disease)     a. CABG 03/2011 LIMA to LAD, SVG to OM, SVG to PDA. b. cath 06/25/2015 DES to SVG to rPDA, patent LIMA to LAD, patent SVG to OM  . Myocardial infarction [facility]) 2012 "several"  . Chronic bronchitis ([place])     "get it pretty much q yr" (06/24/2015)  . On home oxygen therapy     "2 liters at night; negative for sleep apnea"  . Type II diabetes mellitus ([place])   . History of blood transfusion "a few times"    "all related to anemia" (06/24/2015)  . History of hiatal hernia   . Chronic back pain     "all over" (06/24/2015)  . Depression   . Subclavian artery stenosis, left  stented by Dr. Trula Slade on 10/18 to help flow of her LIMA to LAD    Past Surgical History  Procedure Laterality Date  . Carotid endarterectomy Left   . Laceration repair Right 1990's    WRIST  . Right femoral-popliteal bypass    . Coronary artery bypass graft  03/05/2011    CABG X3 (LIMA to LAD, SVG to OM, SVG to PDA, EVH via left thigh  . Mitral valve repair  03/05/2011    109mm Memo 3D ring annuloplasty for ischemic MR  . Maze  03/05/2011    complete biatrial lesion set with clipping of LA appendage  . Bowel resection  2013  . Renal angiogram Bilateral 09/11/2013    Procedure: RENAL ANGIOGRAM;  Surgeon: Serafina Mitchell, MD;  Location: [facility] CATH LAB;  Service: Cardiovascular;  Laterality: Bilateral;  . Peripheral vascular catheterization N/A 06/24/2015    Procedure: Aortic Arch Angiography;  Surgeon: Serafina Mitchell, MD;  Location: [place] CV LAB;  Service: Cardiovascular;  Laterality: N/A;  . Peripheral vascular catheterization  06/24/2015    Procedure: Peripheral Vascular Intervention;  Surgeon: Serafina Mitchell, MD;  Location: [place] CV LAB;  Service: Cardiovascular;;  . Peripheral vascular catheterization N/A  06/24/2015    Procedure: Abdominal Aortogram;  Surgeon: Serafina Mitchell, MD;  Location: [place] CV LAB;  Service: Cardiovascular;  Laterality: N/A;  . Cardiac catheterization N/A 06/24/2015    Procedure: Left Heart Cath and Cors/Grafts Angiography;  Surgeon: Leonie Man, MD;  Location: [place] CV LAB;  Service: Cardiovascular;  Laterality: N/A;  . Appendectomy  Aug. 11, 2016    Ruptured  . Abdominal hysterectomy  1990's  . Coronary angioplasty    . Cardiac catheterization N/A 06/25/2015    Procedure: Coronary Stent Intervention;  Surgeon: Sherren Mocha, MD;  Location: [place] CV LAB;  Service: Cardiovascular;  Laterality: N/A;  . Coronary stent placement         History of present illness and  Hospital Course:     Kindly see H&P for history of present illness and admission details, please review complete Labs, Consult reports and Test reports for all details in brief  HPI  from the history and physical done on the day of admission [name] is a 64 y.o. female with a past medical history significant for CAD s/p 3V CABG in 2012 and PCI to SVG-PDA 9 days ago, PAD with renal artery stenosis, s/p CEA and subclavian angioplasty 10 days ago, NIDDM, HTN, hiatal hernia and CKD stage III baseline 1.2-1.4 mg/dL who presents with chest pain and AKI.  The patient had a ruptured appendix in August that was, located by demand NSTEMI. In follow-up she had a planned cardiac catheterization and subclavian angioplasty a week and a half ago. The procedure went uneventfully, and the patient returned home, but noted off-and-on left-sided chest burning and squeezing since then. This sensation is always present for the last week, severe at times without obvious provoking factor, not exertional or worse with doing things like washing her car the other day. It feels similar to the sensation she had after (not before) her CABG, when her chest wall felt "numb" and this is better with rubbing the  chest.   Tonight, the patient's daughter convinced her to come to the ER because the sensation was worse. In the ED, the patient's initial ECG showed stable RBBB and inferolateral T-wave inversions and troponin was negative. She was also noted to have a serum creatinine  of 2.04 mg/dL. [facility] was asked to admit for observation and serial troponins and AKI.   Hospital Course   Chest pain - Cardiology consult appreciated, her symptoms seems to be non-typical consistent with muscular skeletal etiology, cardiac enzymes negative 2, no further ischemic workup as inpatient, and to keep her outpatient appointment with cardiology Richardson Dopp) on 07/10/15  Acute on chronic kidney disease stage II - Patient creatinine is 2, was 1.4 recent discharge, in the setting of recent contrast dye exposure for left subclavian stent 10/18 and coronary stenting 10/19. She is not on a ACE/ARB, instructed to avoid nephrotoxic medication.  Coronary artery disease  - continue with aspirin, Plavix, beta blockers and statin  Discharge Condition:  Stable   Follow UP  Follow-up Information    Call Phineas Inches, MD.   Specialty:  Family Medicine   Why:  Posthospitalization follow-up   Contact information:   [place] [address] [phone]         Discharge Instructions  and  Discharge Medications    Discharge Instructions    Discharge instructions    Complete by:  As directed   Follow with Primary MD Phineas Inches, MD in 7 days   Get CBC, CMP,  Primary MD next visit.    Activity: As tolerated with Full fall precautions use walker/cane & assistance as needed   Disposition Home    Diet: Heart Healthy , carbohydrate modified , with feeding assistance and aspiration precautions.  For Heart failure patients - Check your Weight same time everyday, if you gain over 2 pounds, or you develop in leg swelling, experience more shortness of breath or  chest pain, call your Primary MD immediately. Follow Cardiac Low Salt Diet and 1.5 lit/day fluid restriction.   On your next visit with your primary care physician please Get Medicines reviewed and adjusted.   Please request your Prim.MD to go over all Hospital Tests and Procedure/Radiological results at the follow up, please get all Hospital records sent to your Prim MD by signing hospital release before you go home.   If you experience worsening of your admission symptoms, develop shortness of breath, life threatening emergency, suicidal or homicidal thoughts you must seek medical attention immediately by calling 911 or calling your MD immediately  if symptoms less severe.  You Must read complete instructions/literature along with all the possible adverse reactions/side effects for all the Medicines you take and that have been prescribed to you. Take any new Medicines after you have completely understood and accpet all the possible adverse reactions/side effects.   Do not drive, operating heavy machinery, perform activities at heights, swimming or participation in water activities or provide baby sitting services if your were admitted for syncope or siezures until you have seen by Primary MD or a Neurologist and advised to do so again.  Do not drive when taking Pain medications.    Do not take more than prescribed Pain, Sleep and Anxiety Medications  Special Instructions: If you have smoked or chewed Tobacco  in the last 2 yrs please stop smoking, stop any regular Alcohol  and or any Recreational drug use.  Wear Seat belts while driving.   Please note  You were cared for by a hospitalist during your hospital stay. If you have any questions about your discharge medications or the care you received while you were in the hospital after you are discharged, you can call the unit and asked to  speak with the hospitalist on call if the hospitalist that took care of you is not available. Once you  are discharged, your primary care physician will handle any further medical issues. Please note that NO REFILLS for any discharge medications will be authorized once you are discharged, as it is imperative that you return to your primary care physician (or establish a relationship with a primary care physician if you do not have one) for your aftercare needs so that they can reassess your need for medications and monitor your lab values.     Increase activity slowly    Complete by:  As directed             Medication List    STOP taking these medications        Aspirin-Caffeine 845-65 MG Pack      TAKE these medications        albuterol 108 (90 BASE) MCG/ACT inhaler  Commonly known as:  PROVENTIL HFA;VENTOLIN HFA  Inhale 2 puffs into the lungs every 4 (four) hours as needed for wheezing or shortness of breath. ProAir     amLODipine 10 MG tablet  Commonly known as:  NORVASC  Take 10 mg by mouth daily.     aspirin 81 MG chewable tablet  Chew 1 tablet (81 mg total) by mouth daily.     budesonide-formoterol 160-4.5 MCG/ACT inhaler  Commonly known as:  SYMBICORT  Inhale 2 puffs into the lungs 2 (two) times daily.     carvedilol 25 MG tablet  Commonly known as:  COREG  Take 1 tablet (25 mg total) by mouth 2 (two) times daily with a meal.     cloNIDine 0.2 MG tablet  Commonly known as:  CATAPRES  Take 0.2 mg by mouth 2 (two) times daily.     clopidogrel 75 MG tablet  Commonly known as:  PLAVIX  Take 1 tablet (75 mg total) by mouth daily with breakfast.     ferrous gluconate 325 MG tablet  Commonly known as:  FERGON  Take 325 mg by mouth 2 (two) times daily.     furosemide 20 MG tablet  Commonly known as:  LASIX  Take 1 tablet (20 mg total) by mouth daily.     glipiZIDE 5 MG 24 hr tablet  Commonly known as:  GLUCOTROL XL  Take 5 mg by mouth daily with breakfast.     hydrALAZINE 100 MG tablet  Commonly known as:  APRESOLINE  TAKE 1 TABLET BY MOUTH THREE TIMES DAILY      HYDROcodone-acetaminophen 5-325 MG tablet  Commonly known as:  NORCO/VICODIN  Take 1 tablet by mouth 3 (three) times daily as needed for moderate pain.     pantoprazole 40 MG tablet  Commonly known as:  PROTONIX  Take 1 tablet (40 mg total) by mouth daily.     pravastatin 80 MG tablet  Commonly known as:  PRAVACHOL  TAKE 1 TABLET BY MOUTH DAILY     zolpidem 10 MG tablet  Commonly known as:  AMBIEN  Take 10 mg by mouth at bedtime.          Diet and Activity recommendation: See Discharge Instructions above   Consults obtained -  Cardiology   Major procedures and Radiology Reports - PLEASE review detailed and final reports for all details, in brief -      Dg Chest 2 View  07/04/2015  CLINICAL DATA:  64 year old female with left-sided burning chest pain. EXAM: CHEST  2 VIEW  COMPARISON:  Chest x-ray 06/11/2015. FINDINGS: Mild diffuse peribronchial cuffing. Lung volumes are normal. No consolidative airspace disease. No pleural effusions. No pneumothorax. No pulmonary nodule or mass noted. Pulmonary vasculature and the cardiomediastinal silhouette are within normal limits. Atherosclerosis in the thoracic aorta. Status post median sternotomy for CABG, mitral annuloplasty and left atrial appendage ligation. IMPRESSION: 1. Mild diffuse peribronchial cuffing, suggestive of acute bronchitis. 2. Atherosclerosis. 3. Postoperative changes, as above. Electronically Signed   By: Vinnie Langton M.D.   On: 07/04/2015 00:34    Micro Results     No results found for this or any previous visit (from the past 240 hour(s)).     Today   Subjective:   Reyanna Clermont today has no headache,no abdominal pain,no new weakness tingling or numbness, feels much better wants to go home today.   Objective:   Blood pressure 164/65, pulse 61, temperature 99.1 F (37.3 C), temperature source Oral, resp. rate 14, height 5\' 5"  (1.651 m), weight 64.547 kg (142 lb 4.8 oz), SpO2 96  %.   Intake/Output Summary (Last 24 hours) at 07/04/15 1355 Last data filed at 07/04/15 1145  Gross per 24 hour  Intake    236 ml  Output    350 ml  Net   -114 ml    Exam Awake Alert, Oriented x 3, No new F.N deficits, Normal affect [place].AT,PERRAL Supple Neck,No JVD, No cervical lymphadenopathy appriciated.  Symmetrical Chest wall movement, Good air movement bilaterally, CTAB RRR, 2/6 systolic murmur, mild tenderness over left anterior chest on palpation +ve B.Sounds, Abd Soft, Non tender, No organomegaly appriciated, No rebound -guarding or rigidity. No Cyanosis, Clubbing or edema, No new Rash or bruise  Data Review   CBC w Diff: Lab Results  Component Value Date   WBC 11.3* 07/03/2015   HGB 10.6* 07/03/2015   HCT 31.9* 07/03/2015   PLT 377 07/03/2015   LYMPHOPCT 7.4* 02/26/2011   MONOPCT 5.3 02/26/2011   EOSPCT 3.6 02/26/2011   BASOPCT 0.3 02/26/2011    CMP: Lab Results  Component Value Date   NA 141 07/03/2015   K 4.1 07/03/2015   CL 102 07/03/2015   CO2 25 07/03/2015   BUN 14 07/03/2015   CREATININE 2.04* 07/03/2015   CREATININE 1.32* 06/17/2015   PROT 5.7* 03/07/2011   ALBUMIN 2.8* 03/07/2011   BILITOT 0.4 03/07/2011   ALKPHOS 39 03/07/2011   AST 71* 03/07/2011   ALT 15 03/07/2011  .   Total Time in preparing paper work, data evaluation and todays exam - 35 minutes  ELGERGAWY, DAWOOD M.D on 07/04/2015 at 1:55 PM  [facility]   Office  [phone]

## 2015-07-04 NOTE — H&P (Signed)
History and Physical  Patient Name: [name]     [id number]    DOB: 1951/03/16    DOA: 07/04/2015 Referring physician: Dr. Stark Jock PCP: Phineas Inches, MD      Chief Complaint: Chest pain  HPI: [name] is a 64 y.o. female with a past medical history significant for CAD s/p 3V CABG in 2012 and PCI to SVG-PDA 9 days ago, PAD with renal artery stenosis, s/p CEA and subclavian angioplasty 10 days ago, NIDDM, HTN, hiatal hernia and CKD stage III baseline 1.2-1.4 mg/dL who presents with chest pain and AKI.  The patient had a ruptured appendix in August that was, located by demand NSTEMI. In follow-up she had a planned cardiac catheterization and subclavian angioplasty a week and a half ago. The procedure went uneventfully, and the patient returned home, but noted off-and-on left-sided chest burning and squeezing since then. This sensation is always present for the last week, severe at times without obvious provoking factor, not exertional or worse with doing things like washing her car the other day.  It feels similar to the sensation she had after (not before) her CABG, when her chest wall felt "numb" and this is better with rubbing the chest.    Tonight, the patient's daughter convinced her to come to the ER because the sensation was worse. In the ED, the patient's initial ECG showed stable RBBB and inferolateral T-wave inversions and troponin was negative.  She was also noted to have a serum creatinine of 2.04 mg/dL. [facility] was asked to admit for observation and serial troponins and AKI.     Review of Systems:  Patient seen 3:05 AM on 07/04/2015. Pt complains of left-sided chest burning and squeezing, feeling more winded than usual, left chest wall tenderness. Pt denies any nausea, diaphoresis, exertional symptoms, cough, coughing up mucus.  All other systems negative except as just noted or noted in the history of present illness.  Allergies  Allergen Reactions  . Imdur [Isosorbide  Dinitrate] Other (See Comments)    "felt like my insides were going to explode"  . Codeine Itching  . Contrast Media [Iodinated Diagnostic Agents] Itching and Other (See Comments)    Itching of feet  . Ioxaglate Itching and Other (See Comments)    Itching of feet  . Metrizamide Itching and Other (See Comments)    Itching of feet  . Percocet [Oxycodone-Acetaminophen] Itching and Other (See Comments)    Tolerates Hydrocodone    Prior to Admission medications   Medication Sig Start Date End Date Taking? Authorizing Provider  albuterol (PROVENTIL HFA;VENTOLIN HFA) 108 (90 BASE) MCG/ACT inhaler Inhale 2 puffs into the lungs every 4 (four) hours as needed for wheezing or shortness of breath. ProAir   Yes Historical Provider, MD  amLODipine (NORVASC) 10 MG tablet Take 10 mg by mouth daily.     Yes Historical Provider, MD  aspirin 81 MG chewable tablet Chew 1 tablet (81 mg total) by mouth daily. 06/26/15  Yes Almyra Deforest, PA  Aspirin-Caffeine 845-65 MG PACK Take 1 packet by mouth daily as needed (aches and pains).   Yes Historical Provider, MD  budesonide-formoterol (SYMBICORT) 160-4.5 MCG/ACT inhaler Inhale 2 puffs into the lungs 2 (two) times daily.  06/11/15 06/10/16 Yes Historical Provider, MD  carvedilol (COREG) 25 MG tablet Take 1 tablet (25 mg total) by mouth 2 (two) times daily with a meal. 10/21/14  Yes Burtis Junes, NP  cloNIDine (CATAPRES) 0.2 MG tablet Take 0.2 mg by mouth 2 (  two) times daily.   Yes Historical Provider, MD  clopidogrel (PLAVIX) 75 MG tablet Take 1 tablet (75 mg total) by mouth daily with breakfast. 06/26/15  Yes Almyra Deforest, PA  ferrous gluconate (FERGON) 325 MG tablet Take 325 mg by mouth 2 (two) times daily.    Yes Historical Provider, MD  furosemide (LASIX) 20 MG tablet Take 1 tablet (20 mg total) by mouth daily. 02/24/15  Yes Scott T Kathlen Mody, PA-C  glipiZIDE (GLUCOTROL XL) 5 MG 24 hr tablet Take 5 mg by mouth daily with breakfast.   Yes Historical Provider, MD  hydrALAZINE  (APRESOLINE) 100 MG tablet TAKE 1 TABLET BY MOUTH THREE TIMES DAILY Patient taking differently: TAKE 100 MG BY MOUTH THREE TIMES DAILY 12/25/13  Yes Sherren Mocha, MD  HYDROcodone-acetaminophen (NORCO/VICODIN) 5-325 MG per tablet Take 1 tablet by mouth 3 (three) times daily as needed for moderate pain.    Yes Historical Provider, MD  pantoprazole (PROTONIX) 40 MG tablet Take 1 tablet (40 mg total) by mouth daily. 06/26/15  Yes Almyra Deforest, PA  pravastatin (PRAVACHOL) 80 MG tablet TAKE 1 TABLET BY MOUTH DAILY Patient taking differently: TAKE 80 MG BY MOUTH DAILY 10/09/12  Yes Jolaine Artist, MD  zolpidem (AMBIEN) 10 MG tablet Take 10 mg by mouth at bedtime. 05/07/15 05/06/16 Yes Historical Provider, MD    Past Medical History  Diagnosis Date  . Coronary artery disease 5/09    s/p CABG 7/12   L-LAD, S-OM, S-PDA  . PAD (peripheral artery disease) (HCC)     Severe; s/p bilateral renal artery stents, moderate in-stent restenosis  . AAA (abdominal aortic aneurysm) ([place]) 5/09    3.7x3.3 by u/s 2009  . carotid stenosis 5/09    S/p L CEA ;  60-79% bilat ICA by preCABG dopplers 7/12  . Hypertension   . Hyperlipidemia   . COPD (chronic obstructive pulmonary disease) ([place])   . GERD (gastroesophageal reflux disease)     With hiatal hernia  . Paroxysmal atrial fibrillation (HCC)     coumadin;  echo 9/07: EF 60%, mild LVH;  s/p Cox Maze 7/12 with LAA clipping  . GIB (gastrointestinal bleeding) 9/11    S/p EGD with cautery at [facility]  . Abnormal EKG     deep TW inversions chronic  . CKD (chronic kidney disease)   . Mitral regurgitation     3+ MR by intraoperative TEE;  s/p MV repair with Dr. Roxy Manns 7/12  . Anemia   . CHF (congestive heart failure) ([place])   . Complication of anesthesia 1966    "problem w/ether"  . Heart murmur   . CAD (coronary artery disease)     a. CABG 03/2011 LIMA to LAD, SVG to OM, SVG to PDA. b. cath 06/25/2015 DES to SVG to rPDA, patent LIMA to LAD, patent SVG to OM  . Myocardial  infarction [facility]) 2012 "several"  . Chronic bronchitis ([place])     "get it pretty much q yr" (06/24/2015)  . On home oxygen therapy     "2 liters at night; negative for sleep apnea"  . Type II diabetes mellitus ([place])   . History of blood transfusion "a few times"    "all related to anemia" (06/24/2015)  . History of hiatal hernia   . Chronic back pain     "all over" (06/24/2015)  . Depression   . Subclavian artery stenosis, left     stented by Dr. Trula Slade on 10/18 to help flow of her LIMA to LAD  Past Surgical History  Procedure Laterality Date  . Carotid endarterectomy Left   . Laceration repair Right 1990's    WRIST  . Right femoral-popliteal bypass    . Coronary artery bypass graft  03/05/2011    CABG X3 (LIMA to LAD, SVG to OM, SVG to PDA, EVH via left thigh  . Mitral valve repair  03/05/2011    36mm Memo 3D ring annuloplasty for ischemic MR  . Maze  03/05/2011    complete biatrial lesion set with clipping of LA appendage  . Bowel resection  2013  . Renal angiogram Bilateral 09/11/2013    Procedure: RENAL ANGIOGRAM;  Surgeon: Serafina Mitchell, MD;  Location: [facility] CATH LAB;  Service: Cardiovascular;  Laterality: Bilateral;  . Peripheral vascular catheterization N/A 06/24/2015    Procedure: Aortic Arch Angiography;  Surgeon: Serafina Mitchell, MD;  Location: [place] CV LAB;  Service: Cardiovascular;  Laterality: N/A;  . Peripheral vascular catheterization  06/24/2015    Procedure: Peripheral Vascular Intervention;  Surgeon: Serafina Mitchell, MD;  Location: [place] CV LAB;  Service: Cardiovascular;;  . Peripheral vascular catheterization N/A 06/24/2015    Procedure: Abdominal Aortogram;  Surgeon: Serafina Mitchell, MD;  Location: [place] CV LAB;  Service: Cardiovascular;  Laterality: N/A;  . Cardiac catheterization N/A 06/24/2015    Procedure: Left Heart Cath and Cors/Grafts Angiography;  Surgeon: Leonie Man, MD;  Location: [place] CV LAB;  Service: Cardiovascular;   Laterality: N/A;  . Appendectomy  Aug. 11, 2016    Ruptured  . Abdominal hysterectomy  1990's  . Coronary angioplasty    . Cardiac catheterization N/A 06/25/2015    Procedure: Coronary Stent Intervention;  Surgeon: Sherren Mocha, MD;  Location: [place] CV LAB;  Service: Cardiovascular;  Laterality: N/A;  . Coronary stent placement      Family history: family history includes AAA (abdominal aortic aneurysm) in her mother; Diabetes in her brother; Emphysema in her father and mother; Heart attack in her brother and mother; Heart attack (age of onset: 37) in her father; Heart disease in her brother, father, mother, and other; Hyperlipidemia in her brother, father, and mother; Hypertension in her brother, father, and mother; Peripheral vascular disease in her father.  Social History: Patient lives independently.  She is an active smoker.    She has a daughter who lives locally.     Physical Exam: BP 190/61 mmHg  Pulse 64  Temp(Src) 98.7 F (37.1 C) (Oral)  Resp 22  SpO2 99% General appearance: Well-developed, adult female, alert and in no acute distress.   Eyes: Anicteric, conjunctiva pink, lids and lashes normal.     ENT: No nasal deformity, discharge, or epistaxis.  OP moist without lesions.  Edentulous. Skin: Warm and dry.  No skin rash over left upper chest. Cardiac: RRR, nl S1-S2, 2/6 SEM heard best at RUSB.  Carotid bruit on left.  Capillary refill is brisk.  JVP normal.  No LE edema.  Radial pulses 2+ and symmetric. Respiratory: Normal respiratory rate and rhythm.  Expiratory wheezes bilaterally. Abdomen: Abdomen soft without rigidity.  No TTP. No ascites, distension.   MSK: No deformities or effusions. Neuro: Sensorium intact and responding to questions, attention normal.  Speech is fluent.  Moves all extremities equally and with normal coordination.    Psych: Behavior appropriate.  Affect normal.  No evidence of aural or visual hallucinations or delusions.       Labs on  Admission:  The metabolic panel shows serum creatinine  2.04 mg/dL from a baseline around 1.3. Initial troponin is 0.02 ng per mL. The INR is normal. The complete blood count shows leukocytosis to 11.3 K/ul, chronic stable normocytic anemia, normal platelet count.   Radiological Exams on Admission: Personally reviewed: Dg Chest 2 View 07/04/2015  No focal opacities or PTX.  Peribronchial cuffing suggests bronchitis.   EKG: Independently reviewed. RBBB with TWI in inferior and lateral leads, similar to previous.    Assessment/Plan  1. Chest pain: This is new.  The patient has HEART score of 5. We have been asked to admit the patient for observation and etiology consultation with Cardiology tomorrow. Other possible explanations for her chest pain are bronchitis (she has been holding her home Symbicort because the LABA causes tachycardia), Zoster, GERD from hiatal hernia. -Serial troponins are ordered -Telemetry -Echocardiogram is ordered -Consult to cardiology, appreciate recommendations -Continue home BB, aspirin, and clopidogrel  2. Acute on chronic kidney injury:  This is new.  Had contrast recently.  Etiology unclear.  Baseline creatinine 1.2-1.4 mg/dl -Fluid resuscitation -Urine electrolytes -Repeat BMP  3. Leukocytosis:  New.  Unclear etiology. -Repeat CBC  4. HTN:  Hypertensive at admission -Continue home BB, amlodipine, hydralazine, clonidine  5. NIDDM:  Stable.  -Continue home glipizide  6. COPD:  Stable.  -Hold home Symbicort because of patient tachycardia Ipratropium once  7. Anemia:  Stable.  -Continue iron     DVT PPx: Outpatient status Diet: NPO after 4am, may advance diet if no stress testing Consultants: Cardiology Code Status: Full Family Communication: Daughter at bedside.  Disposition Plan:  Observe for arrhythmia on telemetry, serial troponins and subsequent risk stratification by Cardiology.  If testing negative and AKI resolves,  home after.  At the point of initial evaluation, it is my clinical opinion that admission for OBSERVATION is reasonable and necessary because the patient's presenting symptoms, physical exam findings, and initial radiographic and laboratory data in the context of chronic comorbidities is felt to place him/her at high risk for further clinical deterioration but it is anticipated that the patient may be medically stable for discharge from the hospital within 24 to 48 hours.      Edwin Dada [facility] Pager [phone]

## 2015-07-05 LAB — UREA NITROGEN, URINE: UREA NITROGEN UR: 175 mg/dL

## 2015-07-09 NOTE — Progress Notes (Signed)
Cardiology Office Note   Date:  07/10/2015   ID:  Amy, [name] Oct 30, 1950, MRN [id number]   Patient Care Team: Amy Limbo, MD as PCP - General (Family Medicine) Amy Mocha, MD as Consulting Physician (Cardiology) Amy Misty, MD as Consulting Physician (Vascular Surgery) Amy Shi, PA-C as Physician Assistant (Cardiology)    Chief Complaint  Patient presents with  . Hospitalization Follow-up    s/p PCI  . Coronary Artery Disease     History of Present Illness: [name] is a 64 y.o. female with a hx of extensive peripheral arterial disease followed by Dr. Kellie [name].  She is s/p multiple renal artery stent and PTA procedures, CEA. She has COPD, hypertension, coronary artery disease, paroxysmal atrial fibrillation, and history of GI bleed. In June 2012 she presented with non-ST elevation MI and underwent catheterization showing severe three-vessel coronary artery disease including left main disease. She also had severe mitral regurgitation. She underwent coronary bypass grafting and mitral valve repair during that hospital admission. She also had a CryoCox-Maze procedure with left atrial appendage clipping.    Last seen by Dr. Kellie [name] in 12/2014. She was noted to have L subclavian steal with L arm claudication and assoc dizziness. The patient was not interested in intervention of the L subclavian artery at that time. She was admitted in 8/16 to [facility] with a rupture appendix and she had a lap appy. She had elevated Troponin levels.    She was seen in FU with Dr. Sherren [name] in 10/16 to evaluate for treatment of L subclavian stenosis given her hx of L-LAD grafting.    She was noted to have resting anginal symptoms. Cardiac cath was arranged at the time of her L subclavian angiography.  Admitted 10/18-10/20. She underwent angiography with Dr. Trula [name] which demonstrated 80% left subclavian artery stenosis. This was treated with a stent. She was then  brought back for cardiac catheterization 10/19. This demonstrated an occluded left main and LAD, high-grade disease in the ostial RCA, critical stenosis in SVG-RPDA, patent SVG-OM1, difficult to visualize LIMA-LAD. She was brought back for staged LHC/PCI 06/25/15. LIMA graft was widely patent. She underwent PCI with a DES to the SVG-RPDA. She was noted to have a decreased hemoglobin post intervention.  Readmitted 07/04/15 with chest pain. Troponin levels remained normal. She was seen by Dr. Martinique for cardiology. Symptoms were not felt to be related to ischemia and no further cardiac workup is recommended. Creatinine was noted to be elevated (peak 2.04).  Returns for FU.  Doing well since DC. No further chest pain. She is having a lot more dyspepsia. Protonix is new. She used to be on Omeprazole.  The Protonix is not helping as much. She notes a high salt meal yesterday.  She is up 2 lbs today and her breathing is slightly worse.  She sleeps on 2 pillows. Denies PND, edema, syncope.    Studies/Reports Reviewed Today:  LHC 06/25/15 1. Widely patent LIMA-LAD graft 2. Successful PCI of the SVG-right PDA with a DES using distal embolic protection with a Spider device - 3 12 mm Promus DES  LHC 06/24/15 LM:  100% LAD:  Mid 100% RI: ok LCx: ok RCA:  Ostial 85%, prox 55% S-RPDA 90% S-OM1 ok L-LAD ok The patient has severe native vessel disease as described with left main occlusion and ostial RCA stenosis that is severe.  She has severe stenosis of the proximal SVG-RCA that will require PCI. Fortunately she has  elevated creatinine level and we were unable to visualize the LIMA graft.    PV A-gram 06/24/15 Impression: #1 80% left subclavian artery stenosis successfully treated using a 8 x 18 balloon expandable stent with residual stenosis less than 10% #2 type I aortic arch  Carotid US 10/4//16 R 40-59% L CEA patent with 40-59% L subclavian steal  Abdominal US  12/2014 Distal AAA 4.3 cm  Echo 07/05/14 EF 55-60%, normal wall motion, mild to moderate MS (mean 9 mmHg), mild LAE, mild RVE, PASP 51 mmHg  Holter x 24 Hrs 10/2013 PVCs, PACs, rare short run of PSVT  Rena Artery [place] 12/2012 60% R RAS; 1-59% L RAS  LHC 03/2011 3v CAD with 90% mid to dist LM stenosis    Past Medical History  Diagnosis Date  . Coronary artery disease 5/09    s/p CABG 7/12   L-LAD, S-OM, S-PDA  . PAD (peripheral artery disease) (HCC)     Severe; s/p bilateral renal artery stents, moderate in-stent restenosis  . AAA (abdominal aortic aneurysm) ([place]) 5/09    3.7x3.3 by u/s 2009  . carotid stenosis 5/09    S/p L CEA ;  60-79% bilat ICA by preCABG dopplers 7/12  . Hypertension   . Hyperlipidemia   . COPD (chronic obstructive pulmonary disease) ([place])   . GERD (gastroesophageal reflux disease)     With hiatal hernia  . Paroxysmal atrial fibrillation (HCC)     coumadin;  echo 9/07: EF 60%, mild LVH;  s/p Cox Maze 7/12 with LAA clipping  . GIB (gastrointestinal bleeding) 9/11    S/p EGD with cautery at [facility]  . Abnormal EKG     deep TW inversions chronic  . CKD (chronic kidney disease)   . Mitral regurgitation     3+ MR by intraoperative TEE;  s/p MV repair with Dr. Roxy Manns 7/12  . Anemia   . CHF (congestive heart failure) ([place])   . Complication of anesthesia 1966    "problem w/ether"  . Heart murmur   . CAD (coronary artery disease)     a. CABG 03/2011 LIMA to LAD, SVG to OM, SVG to PDA. b. cath 06/25/2015 DES to SVG to rPDA, patent LIMA to LAD, patent SVG to OM  . Myocardial infarction [facility]) 2012 "several"  . Chronic bronchitis ([place])     "get it pretty much q yr" (06/24/2015)  . On home oxygen therapy     "2 liters at night; negative for sleep apnea"  . Type II diabetes mellitus ([place])   . History of blood transfusion "a few times"    "all related to anemia" (06/24/2015)  . History of hiatal hernia   . Chronic back pain     "all over" (06/24/2015)  . Depression     . Subclavian artery stenosis, left     stented by Dr. Trula [name] on 10/18 to help flow of her LIMA to LAD    Past Surgical History  Procedure Laterality Date  . Carotid endarterectomy Left   . Laceration repair Right 1990's    WRIST  . Right femoral-popliteal bypass    . Coronary artery bypass graft  03/05/2011    CABG X3 (LIMA to LAD, SVG to OM, SVG to PDA, EVH via left thigh  . Mitral valve repair  03/05/2011    56mm Memo 3D ring annuloplasty for ischemic MR  . Maze  03/05/2011    complete biatrial lesion set with clipping of LA appendage  . Bowel resection  2013  .  Renal angiogram Bilateral 09/11/2013    Procedure: RENAL ANGIOGRAM;  Surgeon: Serafina Mitchell, MD;  Location: [facility] CATH LAB;  Service: Cardiovascular;  Laterality: Bilateral;  . Peripheral vascular catheterization N/A 06/24/2015    Procedure: Aortic Arch Angiography;  Surgeon: Serafina Mitchell, MD;  Location: [place] CV LAB;  Service: Cardiovascular;  Laterality: N/A;  . Peripheral vascular catheterization  06/24/2015    Procedure: Peripheral Vascular Intervention;  Surgeon: Serafina Mitchell, MD;  Location: [place] CV LAB;  Service: Cardiovascular;;  . Peripheral vascular catheterization N/A 06/24/2015    Procedure: Abdominal Aortogram;  Surgeon: Serafina Mitchell, MD;  Location: [place] CV LAB;  Service: Cardiovascular;  Laterality: N/A;  . Cardiac catheterization N/A 06/24/2015    Procedure: Left Heart Cath and Cors/Grafts Angiography;  Surgeon: Leonie Man, MD;  Location: [place] CV LAB;  Service: Cardiovascular;  Laterality: N/A;  . Appendectomy  Aug. 11, 2016    Ruptured  . Abdominal hysterectomy  1990's  . Coronary angioplasty    . Cardiac catheterization N/A 06/25/2015    Procedure: Coronary Stent Intervention;  Surgeon: Amy Mocha, MD;  Location: [place] CV LAB;  Service: Cardiovascular;  Laterality: N/A;  . Coronary stent placement       Current Outpatient Prescriptions  Medication Sig  Dispense Refill  . albuterol (PROVENTIL HFA;VENTOLIN HFA) 108 (90 BASE) MCG/ACT inhaler Inhale 2 puffs into the lungs every 4 (four) hours as needed for wheezing or shortness of breath. ProAir    . amLODipine (NORVASC) 10 MG tablet Take 10 mg by mouth daily.      Marland Kitchen aspirin 81 MG chewable tablet Chew 1 tablet (81 mg total) by mouth daily.    . budesonide-formoterol (SYMBICORT) 160-4.5 MCG/ACT inhaler Inhale 2 puffs into the lungs 2 (two) times daily.     . carvedilol (COREG) 25 MG tablet Take 1 tablet (25 mg total) by mouth 2 (two) times daily with a meal. 60 tablet 11  . cloNIDine (CATAPRES) 0.2 MG tablet Take 1 tablet (0.2 mg total) by mouth 3 (three) times daily. 90 tablet 11  . clopidogrel (PLAVIX) 75 MG tablet Take 1 tablet (75 mg total) by mouth daily with breakfast. 90 tablet 3  . ferrous gluconate (FERGON) 325 MG tablet Take 325 mg by mouth 2 (two) times daily.     . furosemide (LASIX) 20 MG tablet Take 1 tablet (20 mg total) by mouth daily. 30 tablet 6  . glipiZIDE (GLUCOTROL XL) 5 MG 24 hr tablet Take 5 mg by mouth daily with breakfast.    . hydrALAZINE (APRESOLINE) 100 MG tablet TAKE 1 TABLET BY MOUTH THREE TIMES DAILY (Patient taking differently: TAKE 100 MG BY MOUTH THREE TIMES DAILY) 90 tablet 0  . HYDROcodone-acetaminophen (NORCO/VICODIN) 5-325 MG per tablet Take 1 tablet by mouth 3 (three) times daily as needed for moderate pain.     . pantoprazole (PROTONIX) 40 MG tablet Take 1 tablet (40 mg total) by mouth daily. 30 tablet 11  . pravastatin (PRAVACHOL) 80 MG tablet TAKE 1 TABLET BY MOUTH DAILY (Patient taking differently: TAKE 80 MG BY MOUTH DAILY) 30 tablet 0  . zolpidem (AMBIEN) 10 MG tablet Take 10 mg by mouth at bedtime.    . famotidine (PEPCID) 20 MG tablet Take 1 tablet (20 mg total) by mouth daily before supper. 30 tablet 6   No current facility-administered medications for this visit.    Allergies:   Imdur; Codeine; Contrast media; Ioxaglate; Metrizamide; and Percocet  Social History:   Social History   Social History  . Marital Status: Widowed    Spouse Name: N/A  . Number of Children: N/A  . Years of Education: N/A   Occupational History  . Cashier in past    Social History Main Topics  . Smoking status: Current Every Day Smoker -- 0.00 packs/day for 50 years    Types: Cigarettes  . Smokeless tobacco: Never Used  . Alcohol Use: Yes  . Drug Use: No  . Sexual Activity: Not Asked   Other Topics Concern  . None   Social History Narrative   Widowed in 2009.     Family History:   Family History  Problem Relation Age of Onset  . Emphysema Mother   . Heart disease Mother     before age 72  . Hypertension Mother   . Hyperlipidemia Mother   . Heart attack Mother   . Heart attack Father 35  . Emphysema Father   . Heart disease Father     before age 1  . Hyperlipidemia Father   . Hypertension Father   . Peripheral vascular disease Father   . Heart disease Other     Vascular disease in grandparents, uncles and dad  . Diabetes Brother   . Heart disease Brother     before age 72  . Hyperlipidemia Brother   . Hypertension Brother   . Heart attack Brother     CABG  . AAA (abdominal aortic aneurysm) Mother     rupture      ROS:   Please see the history of present illness.   Review of Systems  Cardiovascular: Positive for dyspnea on exertion and irregular heartbeat.  Respiratory: Positive for cough, snoring and wheezing.   Musculoskeletal: Positive for back pain and joint pain.  Psychiatric/Behavioral: Positive for depression. The patient is nervous/anxious.   All other systems reviewed and are negative.     PHYSICAL EXAM: VS:  BP 160/58 mmHg  Pulse 62  Ht 5\' 5"  (1.651 m)  Wt 147 lb 1.9 oz (66.733 kg)  BMI 24.48 kg/m2  SpO2 97%    Wt Readings from Last 3 Encounters:  07/10/15 147 lb 1.9 oz (66.733 kg)  07/04/15 142 lb 4.8 oz (64.547 kg)  06/26/15 148 lb 13 oz (67.5 kg)     GEN: Well nourished, well developed,  in no acute distress HEENT: normal Neck: no JVD,  no masses Cardiac:  Normal S1/S2, RRR; no murmur ,  no rubs or gallops, no edema; L groin with + FA bruit but without hematoma or pulsatile mass; L wrist without hematoma Respiratory:  Clear to auscultation bilaterally, no wheezing, rhonchi or rales. GI: soft, nontender, nondistended, + BS MS: no deformity or atrophy Skin: warm and dry  Neuro:  CNs II-XII intact, Strength and sensation are intact Psych: Normal affect   EKG:  EKG is ordered today.  It demonstrates:   NSR, HR 62, normal axis, TWI 1, 2, 3, aVF, V3-6, RBBB, no change from prior tracing, QTc 485 ms   Recent Labs: 07/03/2015: Hemoglobin 10.6*; Platelets 377 07/04/2015: BUN 14; Creatinine, Ser 1.89*; Potassium 4.0; Sodium 136    Lipid Panel    Component Value Date/Time   CHOL 144 03/05/2011 0446   TRIG 346* 03/05/2011 0446   HDL 17* 03/05/2011 0446   CHOLHDL 8.5 03/05/2011 0446   VLDL 69* 03/05/2011 0446   LDLCALC 58 03/05/2011 0446   LDLDIRECT 78.9 05/07/2008 1301      ASSESSMENT  AND PLAN:  1. Coronary artery disease:  S/p CABG.  Recent admit for L subclavian stenting. LHC demonstrated a patent L-LAD but high grade disease in S-RPDA and this was treated with DES.  No angina.  We discussed the importance of dual antiplatelet therapy.  Continue aspirin, Plavix, beta blocker, statin.  She is intol of nitrates. She is having a hard time with increased indigestion from the change of Omeprazole to Protonix. We could consider changing her to Effient or Brilinta (I will review with Dr. Burt Knack).  2. Mitral valve disorder S/P MVR (mitral valve repair): Stable gradients by recent echocardiogram. Continue SBE prophylaxis.  3. Paroxysmal atrial fibrillation S/P Maze operation for atrial fibrillation: No apparent recurrent atrial fibrillation.  4. Subclavian steal syndrome: She is s/p stenting to the L subclavian.  5. Renal artery stenosis:  Creatinine stable. Continue  aspirin, statin.  6. AAA (abdominal aortic aneurysm) without rupture: Followed by Dr. Kellie [name].  7. HYPERTENSION, BENIGN: Uncontrolled.  Increase Clonidine to 0.2 mg Three times a day.  Continue Coreg 25 bid, Amlodipine 10 QD, Lasix, Hydralazine 100 tid.    8. Hyperlipidemia: Managed by primary care.  9. Tobacco abuse: I have asked her to stop smoking.   10. Chronic Diastolic CHF:  She notes increase weight and worsening DOE.  I have asked her to take an extra dose of Lasix today when she gets home.  11. CKD:  Creatinine prior to DC was improved and close to baseline.    12. GERD:  Continue Protonix. Add Famotidine 20 mg QPM.  If this is not successful, could try Protonix bid.  We may need to change Plavix to Effient or Brilinta so she can use Omeprazole again if all else fails.   13. Anemia:  Iron deficient. She probably has an element of anemia of chronic disease as well.  14. Tobacco Abuse:  She is still trying to quit.       Medication Changes: Current medicines are reviewed at length with the patient today.  Concerns regarding medicines are as outlined above.  The following changes have been made:   Discontinued Medications   No medications on file   Modified Medications   Modified Medication Previous Medication   CLONIDINE (CATAPRES) 0.2 MG TABLET cloNIDine (CATAPRES) 0.2 MG tablet      Take 1 tablet (0.2 mg total) by mouth 3 (three) times daily.    Take 0.2 mg by mouth 2 (two) times daily.   New Prescriptions   FAMOTIDINE (PEPCID) 20 MG TABLET    Take 1 tablet (20 mg total) by mouth daily before supper.   Labs/ tests ordered today include:   Orders Placed This Encounter  Procedures  . EKG 12-Lead     Disposition:    FU with Dr. Sherren [name] 3 mos.     Signed, Versie Starks, MHS 07/10/2015 3:28 PM    [facility] [address], [place], [place]  [address] Phone: [phone]; Fax: [phone]

## 2015-07-10 ENCOUNTER — Encounter: Payer: Self-pay | Admitting: Physician Assistant

## 2015-07-10 ENCOUNTER — Ambulatory Visit (INDEPENDENT_AMBULATORY_CARE_PROVIDER_SITE_OTHER): Payer: Medicare Other | Admitting: Physician Assistant

## 2015-07-10 VITALS — BP 160/58 | HR 62 | Ht 65.0 in | Wt 147.1 lb

## 2015-07-10 DIAGNOSIS — I2510 Atherosclerotic heart disease of native coronary artery without angina pectoris: Secondary | ICD-10-CM

## 2015-07-10 DIAGNOSIS — I10 Essential (primary) hypertension: Secondary | ICD-10-CM | POA: Diagnosis not present

## 2015-07-10 DIAGNOSIS — I5032 Chronic diastolic (congestive) heart failure: Secondary | ICD-10-CM

## 2015-07-10 DIAGNOSIS — I714 Abdominal aortic aneurysm, without rupture, unspecified: Secondary | ICD-10-CM

## 2015-07-10 DIAGNOSIS — G458 Other transient cerebral ischemic attacks and related syndromes: Secondary | ICD-10-CM

## 2015-07-10 DIAGNOSIS — N183 Chronic kidney disease, stage 3 (moderate): Secondary | ICD-10-CM

## 2015-07-10 DIAGNOSIS — Z9889 Other specified postprocedural states: Secondary | ICD-10-CM

## 2015-07-10 DIAGNOSIS — Z720 Tobacco use: Secondary | ICD-10-CM

## 2015-07-10 DIAGNOSIS — I701 Atherosclerosis of renal artery: Secondary | ICD-10-CM

## 2015-07-10 DIAGNOSIS — I480 Paroxysmal atrial fibrillation: Secondary | ICD-10-CM

## 2015-07-10 DIAGNOSIS — K219 Gastro-esophageal reflux disease without esophagitis: Secondary | ICD-10-CM

## 2015-07-10 DIAGNOSIS — E785 Hyperlipidemia, unspecified: Secondary | ICD-10-CM

## 2015-07-10 MED ORDER — CLONIDINE HCL 0.2 MG PO TABS: 0.2000 mg | ORAL_TABLET | Freq: Three times a day (TID) | ORAL | Status: DC

## 2015-07-10 MED ORDER — FAMOTIDINE 20 MG PO TABS: 20.0000 mg | ORAL_TABLET | Freq: Every day | ORAL | Status: DC

## 2015-07-10 NOTE — Patient Instructions (Signed)
Medication Instructions:  1. START PEPCID 20 MG 1 TABLET 30 MINUTES BEFORE DINNER  2. TAKE AN EXTRA DOSE OF LASIX TODAY 3. INCREASE CLONIDINE TO 02. MG THREE TIMES DAILY  Labwork: NONE  Testing/Procedures: NONE  Follow-Up: 3 MONTHS WITH DR. Burt Knack  Any Other Special Instructions Will Be Listed Below (If Applicable). CARDIAC REHAB AT [facility]   If you need a refill on your cardiac medications before your next appointment, please call your pharmacy. CALL IF WEIGHT IS UP 3 LB'S IN 1 DAY [phone]

## 2015-07-15 ENCOUNTER — Telehealth: Payer: Self-pay | Admitting: Cardiovascular Disease

## 2015-07-15 NOTE — Telephone Encounter (Signed)
I spoke with the [name] and she has kept an eye on her left groin since her visit last week with Richardson Dopp PA-C when he noticed bleeding.  The [name] applied a band aid to the area and today she noticed a spot of blood. The site is not actively bleeding. I advised the [name] that her clothing is most likely rubbing this site and causing irritation. The [name] denies any other issues with site.  I advised her to contact the office with any other questions or concerns.

## 2015-07-15 NOTE — Telephone Encounter (Signed)
New message      Pt had a cath on 10-18.  The place on her groin area is bleeding---off and on.   Please call

## 2015-07-24 ENCOUNTER — Encounter: Payer: Self-pay | Admitting: Vascular Surgery

## 2015-07-28 ENCOUNTER — Encounter: Payer: Self-pay | Admitting: Vascular Surgery

## 2015-07-29 ENCOUNTER — Ambulatory Visit (INDEPENDENT_AMBULATORY_CARE_PROVIDER_SITE_OTHER): Payer: Medicare Other | Admitting: Vascular Surgery

## 2015-07-29 ENCOUNTER — Encounter: Payer: Self-pay | Admitting: Vascular Surgery

## 2015-07-29 ENCOUNTER — Ambulatory Visit (HOSPITAL_COMMUNITY)
Admit: 2015-07-29 | Discharge: 2015-07-29 | Disposition: A | Discharge status: Home / Self Care | Payer: Medicare Other | Source: Ambulatory Visit | Attending: Vascular Surgery | Admitting: Vascular Surgery

## 2015-07-29 VITALS — BP 155/72 | HR 56 | Ht 65.0 in | Wt 146.0 lb

## 2015-07-29 DIAGNOSIS — I771 Stricture of artery: Secondary | ICD-10-CM | POA: Insufficient documentation

## 2015-07-29 DIAGNOSIS — Z9862 Peripheral vascular angioplasty status: Secondary | ICD-10-CM | POA: Diagnosis not present

## 2015-07-29 DIAGNOSIS — I739 Peripheral vascular disease, unspecified: Secondary | ICD-10-CM

## 2015-07-29 DIAGNOSIS — I714 Abdominal aortic aneurysm, without rupture, unspecified: Secondary | ICD-10-CM

## 2015-07-29 DIAGNOSIS — I708 Atherosclerosis of other arteries: Secondary | ICD-10-CM | POA: Diagnosis not present

## 2015-07-29 NOTE — Progress Notes (Signed)
Subjective:     Patient ID: [name], female   DOB: [birth year], 63 y.o.   MRN: [id number]  HPI  This 64 year old female returns for follow-up regarding the left subclavian stent placed by Dr. Trula Slade a few weeks ago. Patient had left arm claudication symptoms as well as retrograde flow in left vertebral artery and possibly LIMA graft. Stenting was successful in left arm claudication symptoms have been relieved. Patient had coronary angiography by Dr. Burt Knack a few days later with stenting of the coronary artery. Patient has had previous valve replacement and LIMA graft by Dr. Roxy Manns. Currently patient denies any cardiac symptoms. She does have lower extremity symptoms bilaterally to affect her after walking about 1 block. She had an MRI recently ordered by her medical doctor to rule out spinal stenosis and apparently there was no evidence of spinal stenosis.   Past Medical History  Diagnosis Date  . Coronary artery disease 5/09    s/p CABG 7/12   L-LAD, S-OM, S-PDA  . PAD (peripheral artery disease) (HCC)     Severe; s/p bilateral renal artery stents, moderate in-stent restenosis  . AAA (abdominal aortic aneurysm) ([place]) 5/09    3.7x3.3 by u/s 2009  . carotid stenosis 5/09    S/p L CEA ;  60-79% bilat ICA by preCABG dopplers 7/12  . Hypertension   . Hyperlipidemia   . COPD (chronic obstructive pulmonary disease) ([place])   . GERD (gastroesophageal reflux disease)     With hiatal hernia  . Paroxysmal atrial fibrillation (HCC)     coumadin;  echo 9/07: EF 60%, mild LVH;  s/p Cox Maze 7/12 with LAA clipping  . GIB (gastrointestinal bleeding) 9/11    S/p EGD with cautery at [facility]  . Abnormal EKG     deep TW inversions chronic  . CKD (chronic kidney disease)   . Mitral regurgitation     3+ MR by intraoperative TEE;  s/p MV repair with Dr. Roxy Manns 7/12  . Anemia   . CHF (congestive heart failure) ([place])   . Complication of anesthesia 1966    "problem w/ether"  . Heart murmur   . CAD (coronary  artery disease)     a. CABG 03/2011 LIMA to LAD, SVG to OM, SVG to PDA. b. cath 06/25/2015 DES to SVG to rPDA, patent LIMA to LAD, patent SVG to OM  . Myocardial infarction [facility]) 2012 "several"  . Chronic bronchitis ([place])     "get it pretty much q yr" (06/24/2015)  . On home oxygen therapy     "2 liters at night; negative for sleep apnea"  . Type II diabetes mellitus ([place])   . History of blood transfusion "a few times"    "all related to anemia" (06/24/2015)  . History of hiatal hernia   . Chronic back pain     "all over" (06/24/2015)  . Depression   . Subclavian artery stenosis, left     stented by Dr. Trula Slade on 10/18 to help flow of her LIMA to LAD    Social History  Substance Use Topics  . Smoking status: Current Every Day Smoker -- 0.00 packs/day for 50 years    Types: Cigarettes  . Smokeless tobacco: Never Used  . Alcohol Use: Yes    Family History  Problem Relation Age of Onset  . Emphysema Mother   . Heart disease Mother     before age 65  . Hypertension Mother   . Hyperlipidemia Mother   . Heart attack Mother   .  Heart attack Father 26  . Emphysema Father   . Heart disease Father     before age 74  . Hyperlipidemia Father   . Hypertension Father   . Peripheral vascular disease Father   . Heart disease Other     Vascular disease in grandparents, uncles and dad  . Diabetes Brother   . Heart disease Brother     before age 36  . Hyperlipidemia Brother   . Hypertension Brother   . Heart attack Brother     CABG  . AAA (abdominal aortic aneurysm) Mother     rupture    Allergies  Allergen Reactions  . Imdur [Isosorbide Dinitrate] Other (See Comments)    "felt like my insides were going to explode"  . Codeine Itching  . Contrast Media [Iodinated Diagnostic Agents] Itching and Other (See Comments)    Itching of feet  . Ioxaglate Itching and Other (See Comments)    Itching of feet  . Metrizamide Itching and Other (See Comments)    Itching of feet  .  Percocet [Oxycodone-Acetaminophen] Itching and Other (See Comments)    Tolerates Hydrocodone     Current outpatient prescriptions:  .  amLODipine (NORVASC) 10 MG tablet, Take 10 mg by mouth daily.  , Disp: , Rfl:  .  aspirin 81 MG chewable tablet, Chew 1 tablet (81 mg total) by mouth daily., Disp: , Rfl:  .  budesonide-formoterol (SYMBICORT) 160-4.5 MCG/ACT inhaler, Inhale 2 puffs into the lungs 2 (two) times daily. , Disp: , Rfl:  .  carvedilol (COREG) 25 MG tablet, Take 1 tablet (25 mg total) by mouth 2 (two) times daily with a meal., Disp: 60 tablet, Rfl: 11 .  cloNIDine (CATAPRES) 0.2 MG tablet, Take 1 tablet (0.2 mg total) by mouth 3 (three) times daily., Disp: 90 tablet, Rfl: 11 .  clopidogrel (PLAVIX) 75 MG tablet, Take 1 tablet (75 mg total) by mouth daily with breakfast., Disp: 90 tablet, Rfl: 3 .  famotidine (PEPCID) 20 MG tablet, Take 1 tablet (20 mg total) by mouth daily before supper., Disp: 30 tablet, Rfl: 6 .  ferrous gluconate (FERGON) 325 MG tablet, Take 325 mg by mouth 2 (two) times daily. , Disp: , Rfl:  .  furosemide (LASIX) 20 MG tablet, Take 1 tablet (20 mg total) by mouth daily., Disp: 30 tablet, Rfl: 6 .  glipiZIDE (GLUCOTROL XL) 5 MG 24 hr tablet, Take 5 mg by mouth daily with breakfast., Disp: , Rfl:  .  hydrALAZINE (APRESOLINE) 100 MG tablet, TAKE 1 TABLET BY MOUTH THREE TIMES DAILY (Patient taking differently: TAKE 100 MG BY MOUTH THREE TIMES DAILY), Disp: 90 tablet, Rfl: 0 .  HYDROcodone-acetaminophen (NORCO/VICODIN) 5-325 MG per tablet, Take 1 tablet by mouth 3 (three) times daily as needed for moderate pain. , Disp: , Rfl:  .  pantoprazole (PROTONIX) 40 MG tablet, Take 1 tablet (40 mg total) by mouth daily., Disp: 30 tablet, Rfl: 11 .  pravastatin (PRAVACHOL) 80 MG tablet, TAKE 1 TABLET BY MOUTH DAILY (Patient taking differently: TAKE 80 MG BY MOUTH DAILY), Disp: 30 tablet, Rfl: 0 .  zolpidem (AMBIEN) 10 MG tablet, Take 10 mg by mouth at bedtime., Disp: , Rfl:  .   albuterol (PROVENTIL HFA;VENTOLIN HFA) 108 (90 BASE) MCG/ACT inhaler, Inhale 2 puffs into the lungs every 4 (four) hours as needed for wheezing or shortness of breath. ProAir, Disp: , Rfl:   Filed Vitals:   07/29/15 0952 07/29/15 0953  BP: 140/74 155/72  Pulse: 56  Height: 5\' 5"  (1.651 m)   Weight: 146 lb (66.225 kg)   SpO2: 98%     Body mass index is 24.3 kg/(m^2).          Review of Systems Denies chest pain, dyspnea on exertion, PND, orthopnea, hemoptysis. See history of present illness. Other systems negative and compete review of systems     Objective:   Physical Exam BP 155/72 mmHg  Pulse 56  Ht 5\' 5"  (1.651 m)  Wt 146 lb (66.225 kg)  BMI 24.30 kg/m2  SpO2 98%  Gen.-alert and oriented x3 in no apparent distress HEENT normal for age Lungs no rhonchi or wheezing Cardiovascular regular rhythm no murmurs carotid pulses 3+ palpable no bruits audible Abdomen soft nontender  - 4 cm pulsatile mass-nontender. Musculoskeletal free of  major deformities Skin clear -no rashes Neurologic normal Lower extremities 3+ femoral  Pulses palpable  Bilaterally. Both feet well perfused.  bilateral upper extremities with 2-3+ radial pulses palpable.   Today I ordered carotid duplex exam which I reviewed and interpreted. The left carotid endarterectomy site remains widely patent with minimal stenosis. The left subclavian artery is now widely patent with equal pressures in the right and left upper extremities. Previous exam revealed 60 mm gradient right higher than left. There is also now antegrade flow in the left vertebral artery with triphasic brachial artery waveforms bilaterally.  I also reviewed the previous chart revealing most recent ABIs were 0.8 on the right and . 5 on the left which is stable       Assessment:      #1 recent left subclavian stenting for left arm claudication and possible cardiac syndrome due to retrograde flow in LIMA graft  recent cardiac cath with PTA  and stenting by Dr. Burt Knack  small abdominal aortic aneurysm-4.3 cm in April 2016  lower extremity occlusive disease with failed right femoral-popliteal graft in 1994 - right leg is been stable since that time with no indication to repeat surgery Stable left lower extremity occlusive disease with stable claudication     Plan:      patient to return in 6 months to see nurse practitioner with carotid duplex exam, ABIs, duplex exam of abdominal aortic aneurysm.   I explained to her that because of my upcoming retirement she will be assigned to another physician following her appointment with nurse practitioner

## 2015-07-30 NOTE — Addendum Note (Signed)
Addended by: Dorthula Rue L on: 07/30/2015 02:18 PM   Modules accepted: Orders

## 2015-08-14 ENCOUNTER — Telehealth: Payer: Self-pay | Admitting: Cardiovascular Disease

## 2015-08-14 MED ORDER — FUROSEMIDE 20 MG PO TABS: ORAL_TABLET | ORAL | Status: DC

## 2015-08-14 NOTE — Telephone Encounter (Signed)
New Message  Pt c/o medication issue:  1. Name of Medication: lasix   2. How are you currently taking this medication (dosage and times per day)? Once a day 20mg    4. What is your medication issue? Pt states that she was advised per Milan weaver that she can take an extra one if need be. And for about 3 weeks now she has had to take a few extra and will not be able to make it with the current script. Pt request to up the dosage.  Pt c/o swelling:   1. How long have you been experiencing swelling? Nothing is swelling pt is simply retaining fluid   3.  Are you currently taking a "fluid pill"? Yes   4.  Are you currently SOB? A few times, she has experienced a little bit today. She says that it is most days when she over does it on the salt.

## 2015-08-14 NOTE — Telephone Encounter (Signed)
agree

## 2015-08-14 NOTE — Telephone Encounter (Signed)
I spoke with the pt and she called because she is going to run out of her supply of lasix before it can be refilled again on 12/19.  The pt states that she was advised to take an extra dose of lasix by Richardson Dopp PA-C if her weight increases.  The pt has had to do this 5-6 times in the last month. The pt is not compliant with her diet and I made her aware of the importance of restricting sodium.  The pt is enrolled in [place] [facility] program and they monitor her weight and symptoms.  The [employer] nurse called the pt today and told her to take an extra dose of lasix since her weight increased by 2.2 lbs within 24 hours.  I made the pt aware that we typically advise pt's to take an extra dose when they gain 3 lbs in a 24 hour period or weigh 5 lbs above baseline.  The pt denies swelling today but is a "little" SOB.  I will update Rx so that she has enough medication and the pt will focus on restricting salt in her diet. The pt knows to contact our office if her weight does not decrease and she develops worsening SOB and swelling.

## 2015-08-21 ENCOUNTER — Ambulatory Visit (HOSPITAL_COMMUNITY): Payer: Self-pay

## 2015-08-25 ENCOUNTER — Ambulatory Visit (HOSPITAL_COMMUNITY): Payer: Self-pay

## 2015-08-27 ENCOUNTER — Ambulatory Visit (HOSPITAL_COMMUNITY): Payer: Self-pay

## 2015-08-29 ENCOUNTER — Ambulatory Visit (HOSPITAL_COMMUNITY): Payer: Self-pay

## 2015-09-03 ENCOUNTER — Ambulatory Visit (HOSPITAL_COMMUNITY): Payer: Self-pay

## 2015-09-05 ENCOUNTER — Ambulatory Visit (HOSPITAL_COMMUNITY): Payer: Self-pay

## 2015-09-10 ENCOUNTER — Ambulatory Visit (HOSPITAL_COMMUNITY): Payer: Self-pay

## 2015-09-12 ENCOUNTER — Ambulatory Visit (HOSPITAL_COMMUNITY): Payer: Self-pay

## 2015-09-15 ENCOUNTER — Ambulatory Visit (HOSPITAL_COMMUNITY): Payer: Self-pay

## 2015-09-16 ENCOUNTER — Ambulatory Visit: Payer: Self-pay | Admitting: Vascular Surgery

## 2015-09-16 ENCOUNTER — Encounter (HOSPITAL_COMMUNITY): Payer: Self-pay

## 2015-09-17 ENCOUNTER — Ambulatory Visit (HOSPITAL_COMMUNITY): Payer: Self-pay

## 2015-09-19 ENCOUNTER — Ambulatory Visit (HOSPITAL_COMMUNITY): Payer: Self-pay

## 2015-09-22 ENCOUNTER — Ambulatory Visit (HOSPITAL_COMMUNITY): Payer: Self-pay

## 2015-09-24 ENCOUNTER — Ambulatory Visit (HOSPITAL_COMMUNITY): Payer: Self-pay

## 2015-09-26 ENCOUNTER — Ambulatory Visit (HOSPITAL_COMMUNITY): Payer: Self-pay

## 2015-09-29 ENCOUNTER — Ambulatory Visit (HOSPITAL_COMMUNITY): Payer: Self-pay

## 2015-10-01 ENCOUNTER — Ambulatory Visit (HOSPITAL_COMMUNITY): Payer: Self-pay

## 2015-10-03 ENCOUNTER — Ambulatory Visit (HOSPITAL_COMMUNITY): Payer: Self-pay

## 2015-10-06 ENCOUNTER — Emergency Department (HOSPITAL_COMMUNITY): Payer: Medicare Other

## 2015-10-06 ENCOUNTER — Ambulatory Visit (HOSPITAL_COMMUNITY): Payer: Self-pay

## 2015-10-06 ENCOUNTER — Encounter (HOSPITAL_COMMUNITY): Payer: Self-pay | Admitting: *Deleted

## 2015-10-06 ENCOUNTER — Observation Stay (HOSPITAL_COMMUNITY)
Admission: EM | Admit: 2015-10-06 | Discharge: 2015-10-08 | Disposition: A | Discharge status: Home / Self Care | Payer: Medicare Other | Attending: Internal Medicine | Admitting: Internal Medicine

## 2015-10-06 DIAGNOSIS — Z9981 Dependence on supplemental oxygen: Secondary | ICD-10-CM | POA: Insufficient documentation

## 2015-10-06 DIAGNOSIS — E119 Type 2 diabetes mellitus without complications: Secondary | ICD-10-CM

## 2015-10-06 DIAGNOSIS — R0602 Shortness of breath: Secondary | ICD-10-CM | POA: Diagnosis present

## 2015-10-06 DIAGNOSIS — K219 Gastro-esophageal reflux disease without esophagitis: Secondary | ICD-10-CM | POA: Diagnosis not present

## 2015-10-06 DIAGNOSIS — N189 Chronic kidney disease, unspecified: Secondary | ICD-10-CM | POA: Diagnosis present

## 2015-10-06 DIAGNOSIS — F329 Major depressive disorder, single episode, unspecified: Secondary | ICD-10-CM | POA: Diagnosis not present

## 2015-10-06 DIAGNOSIS — I129 Hypertensive chronic kidney disease with stage 1 through stage 4 chronic kidney disease, or unspecified chronic kidney disease: Secondary | ICD-10-CM | POA: Diagnosis not present

## 2015-10-06 DIAGNOSIS — Z7902 Long term (current) use of antithrombotics/antiplatelets: Secondary | ICD-10-CM | POA: Insufficient documentation

## 2015-10-06 DIAGNOSIS — J4489 Other specified chronic obstructive pulmonary disease: Secondary | ICD-10-CM | POA: Diagnosis present

## 2015-10-06 DIAGNOSIS — G8929 Other chronic pain: Secondary | ICD-10-CM | POA: Insufficient documentation

## 2015-10-06 DIAGNOSIS — I714 Abdominal aortic aneurysm, without rupture: Secondary | ICD-10-CM | POA: Insufficient documentation

## 2015-10-06 DIAGNOSIS — J449 Chronic obstructive pulmonary disease, unspecified: Secondary | ICD-10-CM | POA: Diagnosis present

## 2015-10-06 DIAGNOSIS — I2510 Atherosclerotic heart disease of native coronary artery without angina pectoris: Secondary | ICD-10-CM | POA: Insufficient documentation

## 2015-10-06 DIAGNOSIS — R079 Chest pain, unspecified: Principal | ICD-10-CM | POA: Diagnosis present

## 2015-10-06 DIAGNOSIS — I6529 Occlusion and stenosis of unspecified carotid artery: Secondary | ICD-10-CM | POA: Insufficient documentation

## 2015-10-06 DIAGNOSIS — R7989 Other specified abnormal findings of blood chemistry: Secondary | ICD-10-CM | POA: Diagnosis present

## 2015-10-06 DIAGNOSIS — I739 Peripheral vascular disease, unspecified: Secondary | ICD-10-CM | POA: Diagnosis not present

## 2015-10-06 DIAGNOSIS — F17200 Nicotine dependence, unspecified, uncomplicated: Secondary | ICD-10-CM | POA: Diagnosis present

## 2015-10-06 DIAGNOSIS — E785 Hyperlipidemia, unspecified: Secondary | ICD-10-CM | POA: Insufficient documentation

## 2015-10-06 DIAGNOSIS — I708 Atherosclerosis of other arteries: Secondary | ICD-10-CM | POA: Diagnosis not present

## 2015-10-06 DIAGNOSIS — I509 Heart failure, unspecified: Secondary | ICD-10-CM | POA: Insufficient documentation

## 2015-10-06 DIAGNOSIS — Z7984 Long term (current) use of oral hypoglycemic drugs: Secondary | ICD-10-CM | POA: Diagnosis not present

## 2015-10-06 DIAGNOSIS — I480 Paroxysmal atrial fibrillation: Secondary | ICD-10-CM | POA: Diagnosis not present

## 2015-10-06 DIAGNOSIS — K922 Gastrointestinal hemorrhage, unspecified: Secondary | ICD-10-CM | POA: Diagnosis not present

## 2015-10-06 DIAGNOSIS — Z79899 Other long term (current) drug therapy: Secondary | ICD-10-CM | POA: Insufficient documentation

## 2015-10-06 DIAGNOSIS — I252 Old myocardial infarction: Secondary | ICD-10-CM | POA: Insufficient documentation

## 2015-10-06 DIAGNOSIS — F17210 Nicotine dependence, cigarettes, uncomplicated: Secondary | ICD-10-CM | POA: Insufficient documentation

## 2015-10-06 DIAGNOSIS — R011 Cardiac murmur, unspecified: Secondary | ICD-10-CM | POA: Diagnosis not present

## 2015-10-06 DIAGNOSIS — Z951 Presence of aortocoronary bypass graft: Secondary | ICD-10-CM

## 2015-10-06 DIAGNOSIS — I10 Essential (primary) hypertension: Secondary | ICD-10-CM | POA: Diagnosis present

## 2015-10-06 LAB — CBC
HCT: 36.4 % (ref 36.0–46.0)
Hemoglobin: 12.2 g/dL (ref 12.0–15.0)
MCH: 30.5 pg (ref 26.0–34.0)
MCHC: 33.5 g/dL (ref 30.0–36.0)
MCV: 91 fL (ref 78.0–100.0)
PLATELETS: 277 10*3/uL (ref 150–400)
RBC: 4 MIL/uL (ref 3.87–5.11)
RDW: 15.2 % (ref 11.5–15.5)
WBC: 10.3 10*3/uL (ref 4.0–10.5)

## 2015-10-06 LAB — TROPONIN I: TROPONIN I: 0.03 ng/mL (ref <0.031)

## 2015-10-06 LAB — BASIC METABOLIC PANEL
ANION GAP: 17 — AB (ref 5–15)
BUN: 20 mg/dL (ref 6–20)
CALCIUM: 9.7 mg/dL (ref 8.9–10.3)
CHLORIDE: 103 mmol/L (ref 101–111)
CO2: 24 mmol/L (ref 22–32)
CREATININE: 2.17 mg/dL — AB (ref 0.44–1.00)
GFR calc non Af Amer: 23 mL/min — ABNORMAL LOW (ref >60)
GFR, EST AFRICAN AMERICAN: 27 mL/min — AB (ref >60)
Glucose, Bld: 197 mg/dL — ABNORMAL HIGH (ref 65–99)
Potassium: 3.9 mmol/L (ref 3.5–5.1)
SODIUM: 144 mmol/L (ref 135–145)

## 2015-10-06 LAB — BRAIN NATRIURETIC PEPTIDE: B NATRIURETIC PEPTIDE 5: 177.9 pg/mL — AB (ref 0.0–100.0)

## 2015-10-06 LAB — I-STAT TROPONIN, ED: TROPONIN I, POC: 0 ng/mL (ref 0.00–0.08)

## 2015-10-06 LAB — GLUCOSE, CAPILLARY: GLUCOSE-CAPILLARY: 216 mg/dL — AB (ref 65–99)

## 2015-10-06 MED ORDER — PANTOPRAZOLE SODIUM 40 MG PO TBEC: 40.0000 mg | DELAYED_RELEASE_TABLET | Freq: Every day | ORAL | Status: DC

## 2015-10-06 MED ORDER — ONDANSETRON HCL 4 MG PO TABS: 4.0000 mg | ORAL_TABLET | Freq: Four times a day (QID) | ORAL | Status: DC | PRN

## 2015-10-06 MED ORDER — CLOPIDOGREL BISULFATE 75 MG PO TABS
75.0000 mg | ORAL_TABLET | Freq: Every day | ORAL | Status: DC
  Administered 2015-10-07 – 2015-10-08 (×2): 75 mg via ORAL
  Filled 2015-10-06 (×2): qty 1

## 2015-10-06 MED ORDER — HYDROCODONE-ACETAMINOPHEN 5-325 MG PO TABS
1.0000 | ORAL_TABLET | Freq: Three times a day (TID) | ORAL | Status: DC | PRN
  Administered 2015-10-08: 1 via ORAL
  Filled 2015-10-06: qty 1

## 2015-10-06 MED ORDER — ACETAMINOPHEN 325 MG PO TABS: 650.0000 mg | ORAL_TABLET | Freq: Four times a day (QID) | ORAL | Status: DC | PRN

## 2015-10-06 MED ORDER — ONDANSETRON HCL 4 MG/2ML IJ SOLN: 4.0000 mg | Freq: Four times a day (QID) | INTRAMUSCULAR | Status: DC | PRN

## 2015-10-06 MED ORDER — BUDESONIDE 0.5 MG/2ML IN SUSP
0.5000 mg | Freq: Two times a day (BID) | RESPIRATORY_TRACT | Status: DC
  Administered 2015-10-07 – 2015-10-08 (×3): 0.5 mg via RESPIRATORY_TRACT
  Filled 2015-10-06 (×3): qty 2

## 2015-10-06 MED ORDER — NITROGLYCERIN 0.4 MG SL SUBL: 0.4000 mg | SUBLINGUAL_TABLET | SUBLINGUAL | Status: DC | PRN

## 2015-10-06 MED ORDER — ASPIRIN 81 MG PO CHEW
324.0000 mg | CHEWABLE_TABLET | Freq: Once | ORAL | Status: AC
  Administered 2015-10-06: 324 mg via ORAL
  Filled 2015-10-06: qty 4

## 2015-10-06 MED ORDER — IPRATROPIUM-ALBUTEROL 0.5-2.5 (3) MG/3ML IN SOLN
3.0000 mL | Freq: Four times a day (QID) | RESPIRATORY_TRACT | Status: DC
  Administered 2015-10-07: 3 mL via RESPIRATORY_TRACT
  Filled 2015-10-06 (×2): qty 3

## 2015-10-06 MED ORDER — PRAVASTATIN SODIUM 40 MG PO TABS
80.0000 mg | ORAL_TABLET | Freq: Every day | ORAL | Status: DC
  Administered 2015-10-06 – 2015-10-07 (×2): 80 mg via ORAL
  Filled 2015-10-06 (×2): qty 2

## 2015-10-06 MED ORDER — GI COCKTAIL ~~LOC~~: 30.0000 mL | Freq: Four times a day (QID) | ORAL | Status: DC | PRN

## 2015-10-06 MED ORDER — CLONIDINE HCL 0.2 MG PO TABS
0.2000 mg | ORAL_TABLET | Freq: Three times a day (TID) | ORAL | Status: DC
  Administered 2015-10-07 – 2015-10-08 (×5): 0.2 mg via ORAL
  Filled 2015-10-06 (×5): qty 1

## 2015-10-06 MED ORDER — ASPIRIN 81 MG PO CHEW
81.0000 mg | CHEWABLE_TABLET | Freq: Every day | ORAL | Status: DC
  Administered 2015-10-07 – 2015-10-08 (×2): 81 mg via ORAL
  Filled 2015-10-06 (×2): qty 1

## 2015-10-06 MED ORDER — ZOLPIDEM TARTRATE 5 MG PO TABS
5.0000 mg | ORAL_TABLET | Freq: Every day | ORAL | Status: DC
  Administered 2015-10-06 – 2015-10-07 (×2): 5 mg via ORAL
  Filled 2015-10-06 (×2): qty 1

## 2015-10-06 MED ORDER — MORPHINE SULFATE (PF) 2 MG/ML IV SOLN: 2.0000 mg | INTRAVENOUS | Status: DC | PRN

## 2015-10-06 MED ORDER — ALBUTEROL (5 MG/ML) CONTINUOUS INHALATION SOLN
10.0000 mg/h | INHALATION_SOLUTION | RESPIRATORY_TRACT | Status: AC
  Administered 2015-10-06: 10 mg/h via RESPIRATORY_TRACT
  Filled 2015-10-06: qty 20

## 2015-10-06 MED ORDER — ONDANSETRON HCL 4 MG/2ML IJ SOLN: 4.0000 mg | Freq: Three times a day (TID) | INTRAMUSCULAR | Status: DC | PRN

## 2015-10-06 MED ORDER — HYDRALAZINE HCL 50 MG PO TABS
100.0000 mg | ORAL_TABLET | Freq: Three times a day (TID) | ORAL | Status: DC
  Administered 2015-10-06 – 2015-10-08 (×5): 100 mg via ORAL
  Filled 2015-10-06 (×11): qty 2

## 2015-10-06 MED ORDER — INSULIN ASPART 100 UNIT/ML ~~LOC~~ SOLN
0.0000 [IU] | SUBCUTANEOUS | Status: DC
  Administered 2015-10-06: 3 [IU] via SUBCUTANEOUS
  Administered 2015-10-07: 2 [IU] via SUBCUTANEOUS
  Administered 2015-10-07 (×2): 5 [IU] via SUBCUTANEOUS
  Administered 2015-10-07 – 2015-10-08 (×3): 2 [IU] via SUBCUTANEOUS

## 2015-10-06 MED ORDER — FERROUS GLUCONATE 324 (38 FE) MG PO TABS
325.0000 mg | ORAL_TABLET | Freq: Two times a day (BID) | ORAL | Status: DC
  Administered 2015-10-06 – 2015-10-08 (×3): 325 mg via ORAL
  Filled 2015-10-06 (×6): qty 1

## 2015-10-06 MED ORDER — AMLODIPINE BESYLATE 10 MG PO TABS
10.0000 mg | ORAL_TABLET | Freq: Every day | ORAL | Status: DC
  Administered 2015-10-07 – 2015-10-08 (×2): 10 mg via ORAL
  Filled 2015-10-06 (×2): qty 1

## 2015-10-06 MED ORDER — SODIUM CHLORIDE 0.9% FLUSH
3.0000 mL | Freq: Two times a day (BID) | INTRAVENOUS | Status: DC
  Administered 2015-10-06 – 2015-10-08 (×3): 3 mL via INTRAVENOUS

## 2015-10-06 MED ORDER — ACETAMINOPHEN 650 MG RE SUPP: 650.0000 mg | Freq: Four times a day (QID) | RECTAL | Status: DC | PRN

## 2015-10-06 MED ORDER — IPRATROPIUM-ALBUTEROL 0.5-2.5 (3) MG/3ML IN SOLN: 3.0000 mL | RESPIRATORY_TRACT | Status: DC | PRN

## 2015-10-06 MED ORDER — CARVEDILOL 25 MG PO TABS
25.0000 mg | ORAL_TABLET | Freq: Two times a day (BID) | ORAL | Status: DC
  Administered 2015-10-06 – 2015-10-08 (×4): 25 mg via ORAL
  Filled 2015-10-06 (×4): qty 1

## 2015-10-06 MED ORDER — ACETAMINOPHEN 325 MG PO TABS: 650.0000 mg | ORAL_TABLET | ORAL | Status: DC | PRN

## 2015-10-06 MED ORDER — HEPARIN SODIUM (PORCINE) 5000 UNIT/ML IJ SOLN
5000.0000 [IU] | Freq: Three times a day (TID) | INTRAMUSCULAR | Status: DC
  Administered 2015-10-06 – 2015-10-08 (×3): 5000 [IU] via SUBCUTANEOUS
  Filled 2015-10-06 (×3): qty 1

## 2015-10-06 MED ORDER — FAMOTIDINE 20 MG PO TABS
20.0000 mg | ORAL_TABLET | Freq: Every day | ORAL | Status: DC
  Administered 2015-10-07: 20 mg via ORAL
  Filled 2015-10-06: qty 1

## 2015-10-06 MED ORDER — CLONIDINE HCL 0.2 MG PO TABS
0.2000 mg | ORAL_TABLET | ORAL | Status: AC
  Administered 2015-10-06: 0.2 mg via ORAL
  Filled 2015-10-06: qty 1

## 2015-10-06 MED ORDER — ARFORMOTEROL TARTRATE 15 MCG/2ML IN NEBU
15.0000 ug | INHALATION_SOLUTION | Freq: Two times a day (BID) | RESPIRATORY_TRACT | Status: DC
Start: 2015-10-06 — End: 2015-10-08
  Administered 2015-10-07 – 2015-10-08 (×3): 15 ug via RESPIRATORY_TRACT
  Filled 2015-10-06 (×4): qty 2

## 2015-10-06 NOTE — ED Notes (Signed)
Pt assisted on going to the restroom on arrival.

## 2015-10-06 NOTE — ED Notes (Signed)
Pt with hx of chf to ED c/o increasing sob for several days.  Has been increasing her lasix. Today has been feeling light-headed.

## 2015-10-06 NOTE — ED Notes (Signed)
Admitting MD in room.

## 2015-10-06 NOTE — ED Provider Notes (Signed)
CSN: [id number]     Arrival date & time 10/06/15  1737 History   First MD Initiated Contact with Patient 10/06/15 1944     Chief Complaint  Patient presents with  . Shortness of Breath     (Consider location/radiation/quality/duration/timing/severity/associated sxs/prior Treatment) HPI Comments: The patient is a 65 year old female, she has a history of multiple medical problems including hypertension, coronary artery disease status post bypass grafting, mitral valve repair, congestive heart failure on Plavix, presents to the hospital with a complaint of shortness of breath and chest heaviness which has been going on for the last week, she states this is more or less persistent however does get worse at sometimes. She has the use of oxygen at night but does not needed during the day, she has not had any increased peripheral edema, she has had a weight gain of approximately 3 pounds which she cannot account for and has not improved despite taking extra doses of Lasix this week. There has been no fevers, vomiting, diarrhea, no other new medications. She has a history of COPD but no longer uses albuterol, she does take Symbicort at night and is almost out of that. She still smokes cigarettes. Her symptoms are gradually worsening over the week.  Patient is a 65 y.o. female presenting with shortness of breath. The history is provided by the patient.  Shortness of Breath   Past Medical History  Diagnosis Date  . Coronary artery disease 5/09    s/p CABG 7/12   L-LAD, S-OM, S-PDA  . PAD (peripheral artery disease) (HCC)     Severe; s/p bilateral renal artery stents, moderate in-stent restenosis  . AAA (abdominal aortic aneurysm) ([place]) 5/09    3.7x3.3 by u/s 2009  . carotid stenosis 5/09    S/p L CEA ;  60-79% bilat ICA by preCABG dopplers 7/12  . Hypertension   . Hyperlipidemia   . COPD (chronic obstructive pulmonary disease) ([place])   . GERD (gastroesophageal reflux disease)     With hiatal  hernia  . Paroxysmal atrial fibrillation (HCC)     coumadin;  echo 9/07: EF 60%, mild LVH;  s/p Cox Maze 7/12 with LAA clipping  . GIB (gastrointestinal bleeding) 9/11    S/p EGD with cautery at [facility]  . Abnormal EKG     deep TW inversions chronic  . CKD (chronic kidney disease)   . Mitral regurgitation     3+ MR by intraoperative TEE;  s/p MV repair with Dr. Roxy Manns 7/12  . Anemia   . CHF (congestive heart failure) ([place])   . Complication of anesthesia 1966    "problem w/ether"  . Heart murmur   . CAD (coronary artery disease)     a. CABG 03/2011 LIMA to LAD, SVG to OM, SVG to PDA. b. cath 06/25/2015 DES to SVG to rPDA, patent LIMA to LAD, patent SVG to OM  . Myocardial infarction [facility]) 2012 "several"  . Chronic bronchitis ([place])     "get it pretty much q yr" (06/24/2015)  . On home oxygen therapy     "2 liters at night; negative for sleep apnea"  . Type II diabetes mellitus ([place])   . History of blood transfusion "a few times"    "all related to anemia" (06/24/2015)  . History of hiatal hernia   . Chronic back pain     "all over" (06/24/2015)  . Depression   . Subclavian artery stenosis, left     stented by Dr. Trula Slade on 10/18 to  help flow of her LIMA to LAD   Past Surgical History  Procedure Laterality Date  . Carotid endarterectomy Left   . Laceration repair Right 1990's    WRIST  . Right femoral-popliteal bypass    . Coronary artery bypass graft  03/05/2011    CABG X3 (LIMA to LAD, SVG to OM, SVG to PDA, EVH via left thigh  . Mitral valve repair  03/05/2011    79mm Memo 3D ring annuloplasty for ischemic MR  . Maze  03/05/2011    complete biatrial lesion set with clipping of LA appendage  . Bowel resection  2013  . Renal angiogram Bilateral 09/11/2013    Procedure: RENAL ANGIOGRAM;  Surgeon: Serafina Mitchell, MD;  [facility] C[facility];  Service: Cardiovascular;  Laterality: Bilateral;  . Peripheral vascular catheterization N/A 06/24/2015    Procedure: Aortic Arch Angiography;   Surgeon: Serafina Mitchell, MD;  Location: [place] CV LAB;  Service: Cardiovascular;  Laterality: N/A;  . Peripheral vascular catheterization  06/24/2015    Procedure: Peripheral Vascular Intervention;  Surgeon: Serafina Mitchell, MD;  Location: [place] CV LAB;  Service: Cardiovascular;;  . Peripheral vascular catheterization N/A 06/24/2015    Procedure: Abdominal Aortogram;  Surgeon: Serafina Mitchell, MD;  Location: [place] CV LAB;  Service: Cardiovascular;  Laterality: N/A;  . Cardiac catheterization N/A 06/24/2015    Procedure: Left Heart Cath and Cors/Grafts Angiography;  Surgeon: Leonie Man, MD;  Location: [place] CV LAB;  Service: Cardiovascular;  Laterality: N/A;  . Appendectomy  Aug. 11, 2016    Ruptured  . Abdominal hysterectomy  1990's  . Coronary angioplasty    . Cardiac catheterization N/A 06/25/2015    Procedure: Coronary Stent Intervention;  Surgeon: Sherren Mocha, MD;  Location: [place] CV LAB;  Service: Cardiovascular;  Laterality: N/A;  . Coronary stent placement     Family History  Problem Relation Age of Onset  . Emphysema Mother   . Heart disease Mother     before age 56  . Hypertension Mother   . Hyperlipidemia Mother   . Heart attack Mother   . Heart attack Father 58  . Emphysema Father   . Heart disease Father     before age 57  . Hyperlipidemia Father   . Hypertension Father   . Peripheral vascular disease Father   . Heart disease Other     Vascular disease in grandparents, uncles and dad  . Diabetes Brother   . Heart disease Brother     before age 42  . Hyperlipidemia Brother   . Hypertension Brother   . Heart attack Brother     CABG  . AAA (abdominal aortic aneurysm) Mother     rupture   Social History  Substance Use Topics  . Smoking status: Current Every Day Smoker -- 1.00 packs/day for 50 years    Types: Cigarettes  . Smokeless tobacco: Never Used  . Alcohol Use: Yes   OB History    No data available     Review of  Systems  Respiratory: Positive for shortness of breath.   All other systems reviewed and are negative.     Allergies  Imdur; Codeine; Contrast media; Ioxaglate; Metrizamide; and Percocet  Home Medications   Prior to Admission medications   Medication Sig Start Date End Date Taking? Authorizing Provider  albuterol (PROVENTIL HFA;VENTOLIN HFA) 108 (90 BASE) MCG/ACT inhaler Inhale 2 puffs into the lungs every 4 (four) hours as needed for wheezing  or shortness of breath. ProAir   Yes Historical Provider, MD  amLODipine (NORVASC) 10 MG tablet Take 10 mg by mouth daily.     Yes Historical Provider, MD  aspirin 81 MG chewable tablet Chew 1 tablet (81 mg total) by mouth daily. 06/26/15  Yes Almyra Deforest, PA  budesonide-formoterol (SYMBICORT) 160-4.5 MCG/ACT inhaler Inhale 2 puffs into the lungs 2 (two) times daily as needed (shortness of breath).  06/11/15 06/10/16 Yes Historical Provider, MD  carvedilol (COREG) 25 MG tablet Take 1 tablet (25 mg total) by mouth 2 (two) times daily with a meal. 10/21/14  Yes Burtis Junes, NP  cloNIDine (CATAPRES) 0.2 MG tablet Take 1 tablet (0.2 mg total) by mouth 3 (three) times daily. 07/10/15  Yes Liliane Shi, PA-C  clopidogrel (PLAVIX) 75 MG tablet Take 1 tablet (75 mg total) by mouth daily with breakfast. 06/26/15  Yes Almyra Deforest, PA  famotidine (PEPCID) 20 MG tablet Take 1 tablet (20 mg total) by mouth daily before supper. 07/10/15  Yes Liliane Shi, PA-C  ferrous gluconate (FERGON) 325 MG tablet Take 325 mg by mouth 2 (two) times daily.    Yes Historical Provider, MD  furosemide (LASIX) 20 MG tablet Take 20mg  by mouth daily, may take one additional tablet as needed for weight gain Patient taking differently: Take 20 mg by mouth daily. Take 20mg  by mouth daily, may take one additional tablet as needed for weight gain 08/14/15  Yes Sherren Mocha, MD  glipiZIDE (GLUCOTROL XL) 5 MG 24 hr tablet Take 5 mg by mouth at bedtime.    Yes Historical Provider, MD   hydrALAZINE (APRESOLINE) 100 MG tablet TAKE 1 TABLET BY MOUTH THREE TIMES DAILY Patient taking differently: TAKE 100 MG BY MOUTH THREE TIMES DAILY 12/25/13  Yes Sherren Mocha, MD  HYDROcodone-acetaminophen (NORCO/VICODIN) 5-325 MG per tablet Take 1 tablet by mouth 3 (three) times daily as needed for moderate pain.    Yes Historical Provider, MD  pantoprazole (PROTONIX) 40 MG tablet Take 1 tablet (40 mg total) by mouth daily. 06/26/15  Yes Almyra Deforest, PA  pravastatin (PRAVACHOL) 80 MG tablet TAKE 1 TABLET BY MOUTH DAILY Patient taking differently: TAKE 80 MG BY MOUTH DAILY AT BEDTIME 10/09/12  Yes Jolaine Artist, MD  zolpidem (AMBIEN) 10 MG tablet Take 10 mg by mouth at bedtime. 05/07/15 05/06/16 Yes Historical Provider, MD   BP 185/66 mmHg  Pulse 66  Temp(Src) 97.9 F (36.6 C) (Oral)  Resp 15  Ht 5\' 5"  (1.651 m)  Wt 151 lb (68.493 kg)  BMI 25.13 kg/m2  SpO2 99% Physical Exam  Constitutional: She appears well-developed and well-nourished. No distress.  HENT:  Head: Normocephalic and atraumatic.  Mouth/Throat: Oropharynx is clear and moist. No oropharyngeal exudate.  Eyes: Conjunctivae and EOM are normal. Pupils are equal, round, and reactive to light. Right eye exhibits no discharge. Left eye exhibits no discharge. No scleral icterus.  Neck: Normal range of motion. Neck supple. No JVD present. No thyromegaly present.  Cardiovascular: Normal rate, regular rhythm, normal heart sounds and intact distal pulses.  Exam reveals no gallop and no friction rub.   No murmur heard. Occasional ectopy, S3 heard  Pulmonary/Chest: Effort normal. No respiratory distress. She has wheezes ( wheezing on forced expiration). She has no rales.  No acute distress, no rales, no rhonchi, speaks in full sentences, no accessory muscle use  Abdominal: Soft. Bowel sounds are normal. She exhibits no distension and no mass. There is no tenderness.  Musculoskeletal:  Normal range of motion. She exhibits no edema or  tenderness.  Lymphadenopathy:    She has no cervical adenopathy.  Neurological: She is alert. Coordination normal.  Skin: Skin is warm and dry. No rash noted. No erythema.  Psychiatric: She has a normal mood and affect. Her behavior is normal.  Nursing note and vitals reviewed.   ED Course  Procedures (including critical care time) Labs Review Labs Reviewed  BASIC METABOLIC PANEL - Abnormal; Notable for the following:    Glucose, Bld 197 (*)    Creatinine, Ser 2.17 (*)    GFR calc non Af Amer 23 (*)    GFR calc Af Amer 27 (*)    Anion gap 17 (*)    All other components within normal limits  CBC  BRAIN NATRIURETIC PEPTIDE  I-STAT TROPOININ, ED    Imaging Review Dg Chest 2 View  10/06/2015  CLINICAL DATA:  Chest pain EXAM: CHEST  2 VIEW COMPARISON:  07/04/2015 FINDINGS: Previous median sternotomy and CABG procedure. Mild cardiac enlargement. No pleural effusion or edema identified. No airspace consolidation. Spondylosis noted within the thoracic spine. IMPRESSION: 1. No acute cardiopulmonary abnormalities. 2. Thoracic spondylosis. Electronically Signed   By: Kerby Moors M.D.   On: 10/06/2015 18:20   I have personally reviewed and evaluated these images and lab results as part of my medical decision-making.    MDM   Final diagnoses:  Chest pain, unspecified chest pain type    The patient has an abnormal EKG, with evidence of right bundle branch block and multiple ST and T wave abnormalities, none of these seem to be new from prior, I am concerned for underlying occult ischemia, could also be related to COPD though she does not appear in distress. We'll give albuterol treatment, chest x-ray labs are unremarkable, likely needs to be admitted for diuresis as well as cardiac rule out. She most recently had cardiac cath in October of last year and had 2 stents placed at that time.  ED ECG REPORT  I personally interpreted this EKG   Date: 10/06/2015   Rate: 71  Rhythm: normal  sinus rhythm  QRS Axis: normal  Intervals: normal  ST/T Wave abnormalities: nonspecific ST/T changes  Conduction Disutrbances:right bundle branch block  Narrative Interpretation:   Old EKG Reviewed: unchanged  Discussed with the cardiologist, he agrees that the patient can be admitted to the medical service, discussed with Dr. Tamala Julian who will admit, holding orders requested for observational admission to stepdown  Noemi Chapel, MD 10/06/15 2051

## 2015-10-07 DIAGNOSIS — R7989 Other specified abnormal findings of blood chemistry: Secondary | ICD-10-CM | POA: Diagnosis present

## 2015-10-07 DIAGNOSIS — E119 Type 2 diabetes mellitus without complications: Secondary | ICD-10-CM

## 2015-10-07 DIAGNOSIS — R799 Abnormal finding of blood chemistry, unspecified: Secondary | ICD-10-CM

## 2015-10-07 DIAGNOSIS — N183 Chronic kidney disease, stage 3 (moderate): Secondary | ICD-10-CM

## 2015-10-07 DIAGNOSIS — F17200 Nicotine dependence, unspecified, uncomplicated: Secondary | ICD-10-CM

## 2015-10-07 DIAGNOSIS — I25708 Atherosclerosis of coronary artery bypass graft(s), unspecified, with other forms of angina pectoris: Secondary | ICD-10-CM

## 2015-10-07 DIAGNOSIS — R079 Chest pain, unspecified: Secondary | ICD-10-CM | POA: Insufficient documentation

## 2015-10-07 DIAGNOSIS — N184 Chronic kidney disease, stage 4 (severe): Secondary | ICD-10-CM | POA: Diagnosis not present

## 2015-10-07 DIAGNOSIS — I2510 Atherosclerotic heart disease of native coronary artery without angina pectoris: Secondary | ICD-10-CM

## 2015-10-07 DIAGNOSIS — E118 Type 2 diabetes mellitus with unspecified complications: Secondary | ICD-10-CM | POA: Diagnosis not present

## 2015-10-07 DIAGNOSIS — J438 Other emphysema: Secondary | ICD-10-CM

## 2015-10-07 DIAGNOSIS — J441 Chronic obstructive pulmonary disease with (acute) exacerbation: Secondary | ICD-10-CM | POA: Diagnosis not present

## 2015-10-07 DIAGNOSIS — I10 Essential (primary) hypertension: Secondary | ICD-10-CM

## 2015-10-07 DIAGNOSIS — F17210 Nicotine dependence, cigarettes, uncomplicated: Secondary | ICD-10-CM | POA: Diagnosis not present

## 2015-10-07 DIAGNOSIS — Z951 Presence of aortocoronary bypass graft: Secondary | ICD-10-CM

## 2015-10-07 DIAGNOSIS — I739 Peripheral vascular disease, unspecified: Secondary | ICD-10-CM | POA: Diagnosis not present

## 2015-10-07 LAB — LIPID PANEL
CHOLESTEROL: 181 mg/dL (ref 0–200)
HDL: 27 mg/dL — AB (ref >40)
LDL Cholesterol: UNDETERMINED mg/dL (ref 0–99)
TRIGLYCERIDES: 430 mg/dL — AB (ref <150)
Total CHOL/HDL Ratio: 6.7 RATIO
VLDL: UNDETERMINED mg/dL (ref 0–40)

## 2015-10-07 LAB — BASIC METABOLIC PANEL
ANION GAP: 13 (ref 5–15)
BUN: 24 mg/dL — ABNORMAL HIGH (ref 6–20)
CALCIUM: 8.8 mg/dL — AB (ref 8.9–10.3)
CO2: 25 mmol/L (ref 22–32)
Chloride: 104 mmol/L (ref 101–111)
Creatinine, Ser: 2.24 mg/dL — ABNORMAL HIGH (ref 0.44–1.00)
GFR, EST AFRICAN AMERICAN: 26 mL/min — AB (ref >60)
GFR, EST NON AFRICAN AMERICAN: 22 mL/min — AB (ref >60)
Glucose, Bld: 204 mg/dL — ABNORMAL HIGH (ref 65–99)
Potassium: 3.8 mmol/L (ref 3.5–5.1)
SODIUM: 142 mmol/L (ref 135–145)

## 2015-10-07 LAB — CBC
HEMATOCRIT: 31.5 % — AB (ref 36.0–46.0)
Hemoglobin: 10.3 g/dL — ABNORMAL LOW (ref 12.0–15.0)
MCH: 29.7 pg (ref 26.0–34.0)
MCHC: 32.7 g/dL (ref 30.0–36.0)
MCV: 90.8 fL (ref 78.0–100.0)
PLATELETS: 207 10*3/uL (ref 150–400)
RBC: 3.47 MIL/uL — ABNORMAL LOW (ref 3.87–5.11)
RDW: 15.2 % (ref 11.5–15.5)
WBC: 8.6 10*3/uL (ref 4.0–10.5)

## 2015-10-07 LAB — GLUCOSE, CAPILLARY
GLUCOSE-CAPILLARY: 265 mg/dL — AB (ref 65–99)
GLUCOSE-CAPILLARY: 254 mg/dL — AB (ref 65–99)
GLUCOSE-CAPILLARY: 172 mg/dL — AB (ref 65–99)
Glucose-Capillary: 105 mg/dL — ABNORMAL HIGH (ref 65–99)
Glucose-Capillary: 69 mg/dL (ref 65–99)
Glucose-Capillary: 176 mg/dL — ABNORMAL HIGH (ref 65–99)

## 2015-10-07 LAB — TROPONIN I
TROPONIN I: 0.03 ng/mL (ref <0.031)
Troponin I: 0.04 ng/mL — ABNORMAL HIGH (ref <0.031)

## 2015-10-07 LAB — MRSA PCR SCREENING: MRSA by PCR: NEGATIVE

## 2015-10-07 MED ORDER — FUROSEMIDE 10 MG/ML IJ SOLN
20.0000 mg | Freq: Once | INTRAMUSCULAR | Status: AC
  Administered 2015-10-07: 20 mg via INTRAVENOUS
  Filled 2015-10-07: qty 2

## 2015-10-07 MED ORDER — OMEGA-3-ACID ETHYL ESTERS 1 G PO CAPS
2.0000 g | ORAL_CAPSULE | Freq: Two times a day (BID) | ORAL | Status: DC
  Administered 2015-10-07 – 2015-10-08 (×3): 2 g via ORAL
  Filled 2015-10-07 (×3): qty 2

## 2015-10-07 MED ORDER — FENOFIBRATE 160 MG PO TABS
160.0000 mg | ORAL_TABLET | Freq: Every day | ORAL | Status: DC
  Administered 2015-10-07 – 2015-10-08 (×2): 160 mg via ORAL
  Filled 2015-10-07 (×2): qty 1

## 2015-10-07 MED ORDER — ISOSORBIDE MONONITRATE ER 30 MG PO TB24
15.0000 mg | ORAL_TABLET | Freq: Every day | ORAL | Status: DC
  Administered 2015-10-07 – 2015-10-08 (×2): 15 mg via ORAL
  Filled 2015-10-07 (×2): qty 1

## 2015-10-07 MED ORDER — PANTOPRAZOLE SODIUM 40 MG PO TBEC
40.0000 mg | DELAYED_RELEASE_TABLET | Freq: Two times a day (BID) | ORAL | Status: DC
  Administered 2015-10-07 – 2015-10-08 (×3): 40 mg via ORAL
  Filled 2015-10-07 (×3): qty 1

## 2015-10-07 MED ORDER — NICOTINE 21 MG/24HR TD PT24
21.0000 mg | MEDICATED_PATCH | Freq: Every day | TRANSDERMAL | Status: DC
  Administered 2015-10-07 – 2015-10-08 (×2): 21 mg via TRANSDERMAL
  Filled 2015-10-07 (×2): qty 1

## 2015-10-07 MED ORDER — PREDNISONE 20 MG PO TABS
40.0000 mg | ORAL_TABLET | Freq: Two times a day (BID) | ORAL | Status: DC
  Administered 2015-10-07 – 2015-10-08 (×3): 40 mg via ORAL
  Filled 2015-10-07 (×3): qty 2

## 2015-10-07 NOTE — Consult Note (Signed)
CARDIOLOGY CONSULT NOTE   Patient ID: [name] MRN: [id number] DOB/AGE: [id number] 63 y.o.  Admit date: 10/06/2015  Primary Physician   Phineas Inches, MD Sherren Mocha, MD as Consulting Physician (Cardiology) Mal Misty, MD as Consulting Physician (Vascular Surgery) Liliane Shi, PA-C as Physician Assistant (Cardiology)  Reason for Consultation   Chest pain Requesting Physician Dr. Norval Morton  HPI: [name] is a 65 y.o. female with a significant cardiac hx came to [facility] ED yesterday for evaluation of SOB and chest tightness.    PMH of CAD s/p CABG 03/2011 with LIMA to LAD, SVG to OM, and SVG to PDA, PAD, HTN, HLD, DM, PAF s/p Cox Maze 7/12 with LAA clipping, CKD stage 3 and h/o MR s/p MV repair 03/2011. Patient's mild annuloplasty ring was well seated with mild mitral stenosis and trivial regurgitation. she was hospitalized at [facility] in August with a ruptured appendix and had laprascopic appendectomy. She was also noted to have a pelvic abscess. Postoperatively, she had elevated troponin and was seen by cardiology. Recommended she undergo treatment for left subclavian stenosis since she had LIMA to LAD graft. She was seen by vascular surgery, Dr. [name] on 06/10/2015 who recommended arteriogram with stenting of left subclavian artery. Patient was also noted to have some anginal symptoms. After discussing with Dr. Burt Knack, he recommended coronary angiography at same time as arterial angiogram. The case was coordinated with vascular surgery and the patient eventually presented for the procedure on 06/24/2015. Coronary angiography showed 85% ost RCA pesion, 55% prox RCA lesion, 90% prox SVG to RCA, 100% ost LM and mid LAD, LIMA could not be visualized, staged PCI was planned. Vascular surgery performed angiography at the same time as coronary angiography 10/18, 80% L subclavian artery stenosis treated with 8x18 balloon expandable stent with <10% residual.    Patient was brought back to the cath lab on 10/19 for staged PCI, she underwent successful PCI of SVG to right PDA with DES (3.0x12 mm Promus DES, posted dilated to 3.42mm) using distal embolic protection with a Spider device, patent LIMA to LAD and patent SVG to OM. She was discharged on 06/26/15. SCr day of discharge was 1.43.  Readmitted 07/04/15 with chest pain. Troponin levels remained normal. She was seen by Dr. Martinique for cardiology. Symptoms were not felt to be related to ischemia and no further cardiac workup is recommended. Creatinine was noted to be elevated (peak 2.04).  The patient is not compliant with her diet and continue to smoke. She is advised to take extra Lasix 20 mg for weight gain. For the past 7 days she has intermittent shortness of breath that has been getting worse. The patient also admits to having mid upper chest tightness for the past 2 days. No radiation of pain or associated symptoms. Her baseline weight is 145 LB. She regained 3 pounds (148lb) in the past 7 days. She took extra Lasix 20 mg x 2 during this. However no improvement. She has noticed a declined in her urine. She has stopped taking her fenofibrate and an albuterol inhaler as she cannot afford it. She continued to take her Symbicort. She ALSO noticed mild wheezing. The patient denies orthopnea, PND, syncope, dizziness, lower extremity edema. She takes iron chronically and noted black stool intermittently.  Her breathing is improved on nebulizer treatment. Given IV Lasix 20 mg this morning with great urine output point. No recorded urine.   EKG shows normal sinus rhythm with PVCs,  right bundle branch block and T-wave inversion in inferior lateral lead. This appears chronic and acute ST or T wave abnormality. Troponin 0.03->0.04->0.03. Creatinine 2.17->2.04. BNP 177.9. CXR without acute abnormality.    Past Medical History  Diagnosis Date  . Coronary artery disease 5/09    s/p CABG 7/12   L-LAD, S-OM, S-PDA   . PAD (peripheral artery disease) (HCC)     Severe; s/p bilateral renal artery stents, moderate in-stent restenosis  . AAA (abdominal aortic aneurysm) ([place]) 5/09    3.7x3.3 by u/s 2009  . carotid stenosis 5/09    S/p L CEA ;  60-79% bilat ICA by preCABG dopplers 7/12  . Hypertension   . Hyperlipidemia   . COPD (chronic obstructive pulmonary disease) ([place])   . GERD (gastroesophageal reflux disease)     With hiatal hernia  . Paroxysmal atrial fibrillation (HCC)     coumadin;  echo 9/07: EF 60%, mild LVH;  s/p Cox Maze 7/12 with LAA clipping  . GIB (gastrointestinal bleeding) 9/11    S/p EGD with cautery at [facility]  . Abnormal EKG     deep TW inversions chronic  . CKD (chronic kidney disease)   . Mitral regurgitation     3+ MR by intraoperative TEE;  s/p MV repair with Dr. Roxy Manns 7/12  . Anemia   . CHF (congestive heart failure) ([place])   . Complication of anesthesia 1966    "problem w/ether"  . Heart murmur   . CAD (coronary artery disease)     a. CABG 03/2011 LIMA to LAD, SVG to OM, SVG to PDA. b. cath 06/25/2015 DES to SVG to rPDA, patent LIMA to LAD, patent SVG to OM  . Myocardial infarction [facility]) 2012 "several"  . Chronic bronchitis ([place])     "get it pretty much q yr" (06/24/2015)  . On home oxygen therapy     "2 liters at night; negative for sleep apnea"  . Type II diabetes mellitus ([place])   . History of blood transfusion "a few times"    "all related to anemia" (06/24/2015)  . History of hiatal hernia   . Chronic back pain     "all over" (06/24/2015)  . Depression   . Subclavian artery stenosis, left     stented by Dr. Trula Slade on 10/18 to help flow of her LIMA to LAD     Past Surgical History  Procedure Laterality Date  . Carotid endarterectomy Left   . Laceration repair Right 1990's    WRIST  . Right femoral-popliteal bypass    . Coronary artery bypass graft  03/05/2011    CABG X3 (LIMA to LAD, SVG to OM, SVG to PDA, EVH via left thigh  . Mitral valve repair  03/05/2011     58mm Memo 3D ring annuloplasty for ischemic MR  . Maze  03/05/2011    complete biatrial lesion set with clipping of LA appendage  . Bowel resection  2013  . Renal angiogram Bilateral 09/11/2013    Procedure: RENAL ANGIOGRAM;  Surgeon: Serafina Mitchell, MD;  Location: [facility] CATH LAB;  Service: Cardiovascular;  Laterality: Bilateral;  . Peripheral vascular catheterization N/A 06/24/2015    Procedure: Aortic Arch Angiography;  Surgeon: Serafina Mitchell, MD;  Location: [place] CV LAB;  Service: Cardiovascular;  Laterality: N/A;  . Peripheral vascular catheterization  06/24/2015    Procedure: Peripheral Vascular Intervention;  Surgeon: Serafina Mitchell, MD;  Location: [place] CV LAB;  Service: Cardiovascular;;  . Peripheral vascular catheterization  N/A 06/24/2015    Procedure: Abdominal Aortogram;  Surgeon: Serafina Mitchell, MD;  Location: [place] CV LAB;  Service: Cardiovascular;  Laterality: N/A;  . Cardiac catheterization N/A 06/24/2015    Procedure: Left Heart Cath and Cors/Grafts Angiography;  Surgeon: Leonie Man, MD;  Location: [place] CV LAB;  Service: Cardiovascular;  Laterality: N/A;  . Appendectomy  Aug. 11, 2016    Ruptured  . Abdominal hysterectomy  1990's  . Coronary angioplasty    . Cardiac catheterization N/A 06/25/2015    Procedure: Coronary Stent Intervention;  Surgeon: Sherren Mocha, MD;  Location: [place] CV LAB;  Service: Cardiovascular;  Laterality: N/A;  . Coronary stent placement      Allergies  Allergen Reactions  . Imdur [Isosorbide Dinitrate] Other (See Comments)    "felt like my insides were going to explode"  . Codeine Itching  . Contrast Media [Iodinated Diagnostic Agents] Itching and Other (See Comments)    Itching of feet  . Ioxaglate Itching and Other (See Comments)    Itching of feet  . Metrizamide Itching and Other (See Comments)    Itching of feet  . Percocet [Oxycodone-Acetaminophen] Itching and Other (See Comments)    Tolerates  Hydrocodone    I have reviewed the patient's current medications . amLODipine  10 mg Oral Daily  . arformoterol  15 mcg Nebulization BID  . aspirin  81 mg Oral Daily  . budesonide (PULMICORT) nebulizer solution  0.5 mg Nebulization BID  . carvedilol  25 mg Oral BID WC  . cloNIDine  0.2 mg Oral TID  . clopidogrel  75 mg Oral Q breakfast  . famotidine  20 mg Oral QAC supper  . ferrous gluconate  325 mg Oral BID WC  . heparin  5,000 Units Subcutaneous 3 times per day  . hydrALAZINE  100 mg Oral TID  . insulin aspart  0-9 Units Subcutaneous 6 times per day  . ipratropium-albuterol  3 mL Nebulization Q6H  . nicotine  21 mg Transdermal Daily  . pantoprazole  40 mg Oral BID  . pravastatin  80 mg Oral QHS  . predniSONE  40 mg Oral BID WC  . sodium chloride flush  3 mL Intravenous Q12H  . zolpidem  5 mg Oral QHS     acetaminophen **OR** acetaminophen, gi cocktail, HYDROcodone-acetaminophen, ipratropium-albuterol, morphine injection, nitroGLYCERIN, ondansetron **OR** ondansetron (ZOFRAN) IV  Prior to Admission medications   Medication Sig Start Date End Date Taking? Authorizing Provider  albuterol (PROVENTIL HFA;VENTOLIN HFA) 108 (90 BASE) MCG/ACT inhaler Inhale 2 puffs into the lungs every 4 (four) hours as needed for wheezing or shortness of breath. ProAir   Yes Historical Provider, MD  amLODipine (NORVASC) 10 MG tablet Take 10 mg by mouth daily.     Yes Historical Provider, MD  aspirin 81 MG chewable tablet Chew 1 tablet (81 mg total) by mouth daily. 06/26/15  Yes Almyra Deforest, PA  budesonide-formoterol (SYMBICORT) 160-4.5 MCG/ACT inhaler Inhale 2 puffs into the lungs 2 (two) times daily as needed (shortness of breath).  06/11/15 06/10/16 Yes Historical Provider, MD  carvedilol (COREG) 25 MG tablet Take 1 tablet (25 mg total) by mouth 2 (two) times daily with a meal. 10/21/14  Yes Burtis Junes, NP  cloNIDine (CATAPRES) 0.2 MG tablet Take 1 tablet (0.2 mg total) by mouth 3 (three) times daily.  07/10/15  Yes Liliane Shi, PA-C  clopidogrel (PLAVIX) 75 MG tablet Take 1 tablet (75 mg total) by mouth daily with breakfast.  06/26/15  Yes Almyra Deforest, PA  famotidine (PEPCID) 20 MG tablet Take 1 tablet (20 mg total) by mouth daily before supper. 07/10/15  Yes Liliane Shi, PA-C  ferrous gluconate (FERGON) 325 MG tablet Take 325 mg by mouth 2 (two) times daily.    Yes Historical Provider, MD  furosemide (LASIX) 20 MG tablet Take 20mg  by mouth daily, may take one additional tablet as needed for weight gain Patient taking differently: Take 20 mg by mouth daily. Take 20mg  by mouth daily, may take one additional tablet as needed for weight gain 08/14/15  Yes Sherren Mocha, MD  glipiZIDE (GLUCOTROL XL) 5 MG 24 hr tablet Take 5 mg by mouth at bedtime.    Yes Historical Provider, MD  hydrALAZINE (APRESOLINE) 100 MG tablet TAKE 1 TABLET BY MOUTH THREE TIMES DAILY Patient taking differently: TAKE 100 MG BY MOUTH THREE TIMES DAILY 12/25/13  Yes Sherren Mocha, MD  HYDROcodone-acetaminophen (NORCO/VICODIN) 5-325 MG per tablet Take 1 tablet by mouth 3 (three) times daily as needed for moderate pain.    Yes Historical Provider, MD  pantoprazole (PROTONIX) 40 MG tablet Take 1 tablet (40 mg total) by mouth daily. 06/26/15  Yes Almyra Deforest, PA  pravastatin (PRAVACHOL) 80 MG tablet TAKE 1 TABLET BY MOUTH DAILY Patient taking differently: TAKE 80 MG BY MOUTH DAILY AT BEDTIME 10/09/12  Yes Jolaine Artist, MD  zolpidem (AMBIEN) 10 MG tablet Take 10 mg by mouth at bedtime. 05/07/15 05/06/16 Yes Historical Provider, MD     Social History   Social History  . Marital Status: Widowed    Spouse Name: N/A  . Number of Children: N/A  . Years of Education: N/A   Occupational History  . Cashier in past    Social History Main Topics  . Smoking status: Current Every Day Smoker -- 1.00 packs/day for 50 years    Types: Cigarettes  . Smokeless tobacco: Never Used  . Alcohol Use: Yes  . Drug Use: No  . Sexual Activity:  Not on file   Other Topics Concern  . Not on file   Social History Narrative   Widowed in 2009.    Family Status  Relation Status Death Age  . Mother Deceased 62    AAA rupture  . Father Deceased 13    Heart attack  . Brother Alive     Age 94 with CABG this year  . Maternal Grandmother Deceased   . Maternal Grandfather Deceased   . Paternal Grandmother Deceased   . Paternal Grandfather Deceased    Family History  Problem Relation Age of Onset  . Emphysema Mother   . Heart disease Mother     before age 34  . Hypertension Mother   . Hyperlipidemia Mother   . Heart attack Mother   . Heart attack Father 33  . Emphysema Father   . Heart disease Father     before age 7  . Hyperlipidemia Father   . Hypertension Father   . Peripheral vascular disease Father   . Heart disease Other     Vascular disease in grandparents, uncles and dad  . Diabetes Brother   . Heart disease Brother     before age 30  . Hyperlipidemia Brother   . Hypertension Brother   . Heart attack Brother     CABG  . AAA (abdominal aortic aneurysm) Mother     rupture      ROS:  Full 14 point review of systems complete and found  to be negative unless listed above.  Physical Exam: Blood pressure 153/42, pulse 55, temperature 98.7 F (37.1 C), temperature source Oral, resp. rate 19, height 5\' 5"  (1.651 m), weight 149 lb 11.2 oz (67.903 kg), SpO2 98 %.  General: Well developed, well nourished, female in no acute distress Head: Eyes PERRLA, No xanthomas. Normocephalic and atraumatic, oropharynx without edema or exudate.  Lungs: Resp regular and unlabored, CTA. Heart: RRR no s3, s4, or murmurs..   Neck: No carotid bruits. No lymphadenopathy.  No JVD. Abdomen: Bowel sounds present, abdomen soft and non-tender without masses or hernias noted. Msk:  No spine or cva tenderness. No weakness, no joint deformities or effusions. Extremities: No clubbing, cyanosis or edema. DP/PT/Radials 2+ and equal  bilaterally. Neuro: Alert and oriented X 3. No focal deficits noted. Psych:  Good affect, responds appropriately Skin: No rashes or lesions noted.  Labs:   Lab Results  Component Value Date   WBC 8.6 10/07/2015   HGB 10.3* 10/07/2015   HCT 31.5* 10/07/2015   MCV 90.8 10/07/2015   PLT 207 10/07/2015   No results for input(s): INR in the last 72 hours.  Recent Labs Lab 10/07/15 0155  NA 142  K 3.8  CL 104  CO2 25  BUN 24*  CREATININE 2.24*  CALCIUM 8.8*  GLUCOSE 204*   MAGNESIUM  Date Value Ref Range Status  03/06/2011 2.3 1.5 - 2.5 mg/dL Final    Recent Labs  10/06/15 2246 10/07/15 0155 10/07/15 0510  TROPONINI 0.03 0.04* 0.03    Recent Labs  10/06/15 1810  TROPIPOC 0.00   Lab Results  Component Value Date   CHOL 181 10/07/2015   HDL 27* 10/07/2015   LDLCALC UNABLE TO CALCULATE IF TRIGLYCERIDE OVER 400 mg/dL 10/07/2015   TRIG 430* 10/07/2015    Echo: 09/04/2014 LV EF: 55% -  60%  ------------------------------------------------------------------- Indications:   MVD (I05.9). Atrial fibrillation (I48.91). CAD (I25.10). Chronic diastolic CHF (99991111).  ------------------------------------------------------------------- History:  PMH: Acquired from the patient and from the patient&'s chart. Paroxysmal atrial fibrillation. Coronary artery disease. Congestive heart failure, with an ejection fraction of 55%by echocardiography. The dysfunction is primarily diastolic. Mitral valve disease. Chronic obstructive pulmonary disease. Risk factors: Hypertension.  ------------------------------------------------------------------- Study Conclusions  - Left ventricle: The cavity size was normal. Wall thickness was normal. Systolic function was normal. The estimated ejection fraction was in the range of 55% to 60%. Wall motion was normal; there were no regional wall motion abnormalities. - Mitral valve: The mean gradient is 21mmHg with  pressure half time valve area 1.62cm squared. The findings are consistent with mild to moderate stenosis. Mean gradient (D): 9 mm Hg. Peak gradient (D): 23 mm Hg. Valve area by pressure half-time: 1.62 cm^2. Valve area by continuity equation (using LVOT flow): 1.02 cm^2. - Left atrium: The atrium was mildly dilated. - Right ventricle: The cavity size was mildly dilated. Wall thickness was normal. - Pulmonary arteries: Systolic pressure was moderately increased. PA peak pressure: 51 mm Hg (S).  Impressions:  - Compared to the prior study, there has been no significant interval change.  ECG:  Vent. rate 54 BPM PR interval 128 ms QRS duration 126 ms QT/QTc 544/515 ms P-R-T axes 85 39 197  Radiology:  Dg Chest 2 View  10/06/2015  CLINICAL DATA:  Chest pain EXAM: CHEST  2 VIEW COMPARISON:  07/04/2015 FINDINGS: Previous median sternotomy and CABG procedure. Mild cardiac enlargement. No pleural effusion or edema identified. No airspace consolidation. Spondylosis noted within the thoracic  spine. IMPRESSION: 1. No acute cardiopulmonary abnormalities. 2. Thoracic spondylosis. Electronically Signed   By: Kerby Moors M.D.   On: 10/06/2015 18:20    ASSESSMENT AND PLAN:      1. Possible COPD exacerbation/ SOB - Seems worsening shortness of breath is due to likely pulmonary etiology. Wheezing noted in ED yesterday. Lungs clear today on nebulizer treatment. Breathing has been improved. - BNP of 177 and 3 ib weight gain over the last 1 week. Did not improved sob on extra lasix 20mg  X 2 during this period. She is not complying with diet and continue to smoke. Given IV Lasix 20 mg this morning with good urine output (date not recorded in Epic). Advise importance of dietary compliance and smoking sensation.  - Pending repeat echocardiogram. She appears euvolemic.  2. Chest tightness - Likely due to pulmonary issue. Now resolved. No need for ischemic evaluation.  3. Coronary artery  disease - Status post CABG and recent successful 06/2015 PCI of SVG to right PDA with DES.  - Continue ASA, plavix, BB. Not on ACE due to CKD.   4. CKD, stable IV - baseline around 1.8-2.1. Creatinine mildly increased to 2.24 from 2.17. She has noted decline in urine output for the past 7 days. Follow closely.   5. HL - 10/07/2015: Cholesterol 181; HDL 27*; LDL Cholesterol UNABLE TO CALCULATE IF TRIGLYCERIDE OVER 400 mg/dL; Triglycerides 430*; VLDL UNABLE TO CALCULATE IF TRIGLYCERIDE OVER 400 mg/dL  - She can not afford fenofibrate. Continue Pravastatin. Advice dietary importance.   6. Ongoing tobacco abuse - Discussed importance of smoking cessation.  Spent last 15 minutes given education of significant lifestyle modification including dietary modification, smoking cessation and healthy lifestyle.    SignedLeanor Kail, [place] 10/07/2015, 8:48 AM Pager [phone]  Co-Sign MD  Patient seen and examined. Agree with assessment and plan. Pt is a 65 yo WF with a 56 yr h/o ongoing tobacco abuse, CAD, s/p CABG with subsequent PCI to SVG-RCA, PVD, s/p left  subclavian stenting, RBBB, COPD, and stage 4 CKD. She has experienced intermittent CP and SOB and was concerned about CHF. She has significant hyperlipidemia and has continued to but cigarettes but could not afford fenofibrate. Currently she is pain free. Troponins are negative. ECG reveals SB with RBBB and diffuse T wave abnormalities slightly more prominent today c/w yesterday. Obtain serial ECG's. Cr is 2.24 with GFR 22. Will add nitrates to medical regimen.  Will also add lovaza 2 capsules bid with TG > 400 and resume fenofibrate; ideally should change to high potency statin with atorva 80 or crestor 40 mg. Pt admits to a high sodium diet; discussed diet change and smoking cessation at length.    Troy Sine, MD, [facility] 10/07/2015 11:01 AM

## 2015-10-07 NOTE — Care Management Obs Status (Signed)
[place] NOTIFICATION   Patient Details  Name: [name] MRN: [id number] Date of Birth: 1950/10/23   Medicare Observation Status Notification Given:  Yes    Bethena Roys, RN 10/07/2015, 11:13 AM

## 2015-10-07 NOTE — Progress Notes (Signed)
Spoken with Dr. Claiborne Billings. Will observer her today. EKG in AM. Reevaluate in morning for possible stress test tomorrow or outpatient stress test. NPO after mid night. Needs to get stable Scr.   Janaisha Tolsma, [place]

## 2015-10-07 NOTE — Progress Notes (Signed)
Patient seen and examined. Vital stable and currently w/o CP and no SOB. Patient was admitted after midnight secondary to chest pressure and SOB. Some mild changes appreciated on EKG; troponin neg and heart score of 4-5. Cardiology recommended to have Echo done and adjusted her medications.  Please refer to H&P written by Dr. Tamala Julian for further info/details on admission.  Plan: -observe overnight and complete troponin cycling -decision in am for need of stress test vs home discharge and outpatient follow up  Barton Dubois [id number]

## 2015-10-07 NOTE — H&P (Signed)
[facility] History and Physical  [name] [id number] DOB: 09/26/1950 DOA: 10/06/2015  Referring physician: ED PCP: Phineas Inches, MD   Chief Complaint: Chest pain  HPI:  [name] is 65 year old female with a past medical history significant for HTN, CHF last echo in 06/2014 EF 55-60% with primary distal dysfunction, COPD, CAD s/p Cabg s/p stents placed10/2016, and MV repair; who presents with chest discomfort. Symptoms have been intermittent over the last week. Chest discomfort is located moreso on the left side than on the right. Describes also chest tightness/ heaviness. Symptoms occur usually when sitting. Reports associated symptoms of shortness of breath with exertion, palpitations, and mild leg swelling. Patient notes that she has previously been on albuterol,  but due to finances has not been able to obtain this medication. She reports continuing to take her Symbicort daily. Patient notes that she normally checks her weight and is usually around 144-145 pounds, but had increased 147.7 pounds. She tried taking an extra lasix dose over the last 3 days. She did not notice any change in her weight. Patient still notes chest pain despite being tried on a breathing treatment nitroglycerin. Denies any fevers, chills, nausea, vomiting, recent sick contacts, or recent travel. She notes that her kidney function has been decreased since she had dye for cardiac cath back in 06/2015 where she had had 2 stents placed.  Upon arrival to the emergency department patient was evaluated with a EKG which shows sinus rhythm with PVCs and right bundle branch blocks pretty similar to previous EKG and Troponins were negative. Patient was admitted to stepdown due to continued complaints of chest pain.    Review of Systems  Constitutional: Positive for malaise/fatigue. Negative for fever, chills and weight loss.       Weight gain  HENT: Negative for ear pain and tinnitus.   Eyes: Negative for  photophobia and pain.  Respiratory: Negative for hemoptysis and sputum production.   Cardiovascular: Positive for chest pain and leg swelling.  Gastrointestinal: Positive for constipation. Negative for nausea, vomiting and abdominal pain.  Genitourinary: Negative for urgency and frequency.  Musculoskeletal: Negative for back pain and neck pain.  Skin: Negative for itching.  Neurological: Negative for sensory change, speech change and weakness.  Endo/Heme/Allergies: Positive for polydipsia. Negative for environmental allergies.  Psychiatric/Behavioral: Negative for hallucinations and substance abuse.         Past Medical History  Diagnosis Date  . Coronary artery disease 5/09    s/p CABG 7/12   L-LAD, S-OM, S-PDA  . PAD (peripheral artery disease) (HCC)     Severe; s/p bilateral renal artery stents, moderate in-stent restenosis  . AAA (abdominal aortic aneurysm) ([place]) 5/09    3.7x3.3 by u/s 2009  . carotid stenosis 5/09    S/p L CEA ;  60-79% bilat ICA by preCABG dopplers 7/12  . Hypertension   . Hyperlipidemia   . COPD (chronic obstructive pulmonary disease) ([place])   . GERD (gastroesophageal reflux disease)     With hiatal hernia  . Paroxysmal atrial fibrillation (HCC)     coumadin;  echo 9/07: EF 60%, mild LVH;  s/p Cox Maze 7/12 with LAA clipping  . GIB (gastrointestinal bleeding) 9/11    S/p EGD with cautery at [facility]  . Abnormal EKG     deep TW inversions chronic  . CKD (chronic kidney disease)   . Mitral regurgitation     3+ MR by intraoperative TEE;  s/p MV repair with Dr. Roxy Manns 7/12  .  Anemia   . CHF (congestive heart failure) ([place])   . Complication of anesthesia 1966    "problem w/ether"  . Heart murmur   . CAD (coronary artery disease)     a. CABG 03/2011 LIMA to LAD, SVG to OM, SVG to PDA. b. cath 06/25/2015 DES to SVG to rPDA, patent LIMA to LAD, patent SVG to OM  . Myocardial infarction [facility]) 2012 "several"  . Chronic bronchitis ([place])     "get it pretty much q  yr" (06/24/2015)  . On home oxygen therapy     "2 liters at night; negative for sleep apnea"  . Type II diabetes mellitus ([place])   . History of blood transfusion "a few times"    "all related to anemia" (06/24/2015)  . History of hiatal hernia   . Chronic back pain     "all over" (06/24/2015)  . Depression   . Subclavian artery stenosis, left     stented by Dr. Trula Slade on 10/18 to help flow of her LIMA to LAD     Past Surgical History  Procedure Laterality Date  . Carotid endarterectomy Left   . Laceration repair Right 1990's    WRIST  . Right femoral-popliteal bypass    . Coronary artery bypass graft  03/05/2011    CABG X3 (LIMA to LAD, SVG to OM, SVG to PDA, EVH via left thigh  . Mitral valve repair  03/05/2011    5mm Memo 3D ring annuloplasty for ischemic MR  . Maze  03/05/2011    complete biatrial lesion set with clipping of LA appendage  . Bowel resection  2013  . Renal angiogram Bilateral 09/11/2013    Procedure: RENAL ANGIOGRAM;  Surgeon: Serafina Mitchell, MD;  Location: [facility] CATH LAB;  Service: Cardiovascular;  Laterality: Bilateral;  . Peripheral vascular catheterization N/A 06/24/2015    Procedure: Aortic Arch Angiography;  Surgeon: Serafina Mitchell, MD;  Location: [place] CV LAB;  Service: Cardiovascular;  Laterality: N/A;  . Peripheral vascular catheterization  06/24/2015    Procedure: Peripheral Vascular Intervention;  Surgeon: Serafina Mitchell, MD;  Location: [place] CV LAB;  Service: Cardiovascular;;  . Peripheral vascular catheterization N/A 06/24/2015    Procedure: Abdominal Aortogram;  Surgeon: Serafina Mitchell, MD;  Location: [place] CV LAB;  Service: Cardiovascular;  Laterality: N/A;  . Cardiac catheterization N/A 06/24/2015    Procedure: Left Heart Cath and Cors/Grafts Angiography;  Surgeon: Leonie Man, MD;  Location: [place] CV LAB;  Service: Cardiovascular;  Laterality: N/A;  . Appendectomy  Aug. 11, 2016    Ruptured  . Abdominal  hysterectomy  1990's  . Coronary angioplasty    . Cardiac catheterization N/A 06/25/2015    Procedure: Coronary Stent Intervention;  Surgeon: Sherren Mocha, MD;  Location: [place] CV LAB;  Service: Cardiovascular;  Laterality: N/A;  . Coronary stent placement        Social History:  reports that she has been smoking Cigarettes.  She has a 50 pack-year smoking history. She has never used smokeless tobacco. She reports that she drinks alcohol. She reports that she does not use illicit drugs.   Allergies  Allergen Reactions  . Imdur [Isosorbide Dinitrate] Other (See Comments)    "felt like my insides were going to explode"  . Codeine Itching  . Contrast Media [Iodinated Diagnostic Agents] Itching and Other (See Comments)    Itching of feet  . Ioxaglate Itching and Other (See Comments)    Itching of feet  .  Metrizamide Itching and Other (See Comments)    Itching of feet  . Percocet [Oxycodone-Acetaminophen] Itching and Other (See Comments)    Tolerates Hydrocodone    Family History  Problem Relation Age of Onset  . Emphysema Mother   . Heart disease Mother     before age 61  . Hypertension Mother   . Hyperlipidemia Mother   . Heart attack Mother   . Heart attack Father 71  . Emphysema Father   . Heart disease Father     before age 88  . Hyperlipidemia Father   . Hypertension Father   . Peripheral vascular disease Father   . Heart disease Other     Vascular disease in grandparents, uncles and dad  . Diabetes Brother   . Heart disease Brother     before age 36  . Hyperlipidemia Brother   . Hypertension Brother   . Heart attack Brother     CABG  . AAA (abdominal aortic aneurysm) Mother     rupture       Prior to Admission medications   Medication Sig Start Date End Date Taking? Authorizing Provider  albuterol (PROVENTIL HFA;VENTOLIN HFA) 108 (90 BASE) MCG/ACT inhaler Inhale 2 puffs into the lungs every 4 (four) hours as needed for wheezing or shortness of  breath. ProAir   Yes Historical Provider, MD  amLODipine (NORVASC) 10 MG tablet Take 10 mg by mouth daily.     Yes Historical Provider, MD  aspirin 81 MG chewable tablet Chew 1 tablet (81 mg total) by mouth daily. 06/26/15  Yes Almyra Deforest, PA  budesonide-formoterol (SYMBICORT) 160-4.5 MCG/ACT inhaler Inhale 2 puffs into the lungs 2 (two) times daily as needed (shortness of breath).  06/11/15 06/10/16 Yes Historical Provider, MD  carvedilol (COREG) 25 MG tablet Take 1 tablet (25 mg total) by mouth 2 (two) times daily with a meal. 10/21/14  Yes Burtis Junes, NP  cloNIDine (CATAPRES) 0.2 MG tablet Take 1 tablet (0.2 mg total) by mouth 3 (three) times daily. 07/10/15  Yes Liliane Shi, PA-C  clopidogrel (PLAVIX) 75 MG tablet Take 1 tablet (75 mg total) by mouth daily with breakfast. 06/26/15  Yes Almyra Deforest, PA  famotidine (PEPCID) 20 MG tablet Take 1 tablet (20 mg total) by mouth daily before supper. 07/10/15  Yes Liliane Shi, PA-C  ferrous gluconate (FERGON) 325 MG tablet Take 325 mg by mouth 2 (two) times daily.    Yes Historical Provider, MD  furosemide (LASIX) 20 MG tablet Take 20mg  by mouth daily, may take one additional tablet as needed for weight gain Patient taking differently: Take 20 mg by mouth daily. Take 20mg  by mouth daily, may take one additional tablet as needed for weight gain 08/14/15  Yes Sherren Mocha, MD  glipiZIDE (GLUCOTROL XL) 5 MG 24 hr tablet Take 5 mg by mouth at bedtime.    Yes Historical Provider, MD  hydrALAZINE (APRESOLINE) 100 MG tablet TAKE 1 TABLET BY MOUTH THREE TIMES DAILY Patient taking differently: TAKE 100 MG BY MOUTH THREE TIMES DAILY 12/25/13  Yes Sherren Mocha, MD  HYDROcodone-acetaminophen (NORCO/VICODIN) 5-325 MG per tablet Take 1 tablet by mouth 3 (three) times daily as needed for moderate pain.    Yes Historical Provider, MD  pantoprazole (PROTONIX) 40 MG tablet Take 1 tablet (40 mg total) by mouth daily. 06/26/15  Yes Almyra Deforest, PA  pravastatin (PRAVACHOL) 80  MG tablet TAKE 1 TABLET BY MOUTH DAILY Patient taking differently: TAKE 80 MG BY MOUTH DAILY  AT BEDTIME 10/09/12  Yes Jolaine Artist, MD  zolpidem (AMBIEN) 10 MG tablet Take 10 mg by mouth at bedtime. 05/07/15 05/06/16 Yes Historical Provider, MD     Physical Exam: Filed Vitals:   10/06/15 2100 10/06/15 2130 10/06/15 2203 10/06/15 2343  BP: 164/72 155/74 172/63   Pulse: 69 68 71   Temp:   98.3 F (36.8 C) 98.7 F (37.1 C)  TempSrc:   Oral Oral  Resp: 15 16 21    Height:   5\' 5"  (1.651 m)   Weight:   67.178 kg (148 lb 1.6 oz)   SpO2: 100% 100% 99%      Constitutional: Vital signs reviewed. Patient is a well-developed and well-nourished in no acute distress and cooperative with exam. Alert and oriented x3.  Head: Normocephalic and atraumatic  Ear: TM normal bilaterally  Mouth: no erythema or exudates, MMM  Eyes: PERRL, EOMI, conjunctivae normal, No scleral icterus.  Neck: Supple, Trachea midline normal ROM, No JVD, mass, thyromegaly, or carotid bruit present.  Cardiovascular: RRR, S1 normal, S2 normal, no MRG, pulses symmetric and intact bilaterally  Pulmonary/Chest: Positive for end expiratory wheezes. Otherwise patient able to speak in full sentences with no accessory muscle use. Abdominal: Soft. Non-tender, non-distended, bowel sounds are normal, no masses, organomegaly, or guarding present.  GU: no CVA tenderness Musculoskeletal: No joint deformities, erythema, or stiffness, ROM full and no nontender Ext: no edema and no cyanosis, pulses palpable bilaterally (DP and PT)  Hematology: no cervical, inginal, or axillary adenopathy.  Neurological: A&O x3, Strenght is normal and symmetric bilaterally, cranial nerve II-XII are grossly intact, no focal motor deficit, sensory intact to light touch bilaterally.  Skin: Warm, dry and intact. No rash, cyanosis, or clubbing.  Psychiatric: Normal mood and affect. speech and behavior is normal. Judgment and thought content normal. Cognition and  memory are normal.      Data Review   Micro Results No results found for this or any previous visit (from the past 240 hour(s)).  Radiology Reports Dg Chest 2 View  10/06/2015  CLINICAL DATA:  Chest pain EXAM: CHEST  2 VIEW COMPARISON:  07/04/2015 FINDINGS: Previous median sternotomy and CABG procedure. Mild cardiac enlargement. No pleural effusion or edema identified. No airspace consolidation. Spondylosis noted within the thoracic spine. IMPRESSION: 1. No acute cardiopulmonary abnormalities. 2. Thoracic spondylosis. Electronically Signed   By: Kerby Moors M.D.   On: 10/06/2015 18:20     CBC  Recent Labs Lab 10/06/15 1749  WBC 10.3  HGB 12.2  HCT 36.4  PLT 277  MCV 91.0  MCH 30.5  MCHC 33.5  RDW 15.2    Chemistries   Recent Labs Lab 10/06/15 1749  NA 144  K 3.9  CL 103  CO2 24  GLUCOSE 197*  BUN 20  CREATININE 2.17*  CALCIUM 9.7   ------------------------------------------------------------------------------------------------------------------ estimated creatinine clearance is 24.2 mL/min (by C-G formula based on Cr of 2.17). ------------------------------------------------------------------------------------------------------------------ No results for input(s): HGBA1C in the last 72 hours. ------------------------------------------------------------------------------------------------------------------ No results for input(s): CHOL, HDL, LDLCALC, TRIG, CHOLHDL, LDLDIRECT in the last 72 hours. ------------------------------------------------------------------------------------------------------------------ No results for input(s): TSH, T4TOTAL, T3FREE, THYROIDAB in the last 72 hours.  Invalid input(s): FREET3 ------------------------------------------------------------------------------------------------------------------ No results for input(s): VITAMINB12, FOLATE, FERRITIN, TIBC, IRON, RETICCTPCT in the last 72 hours.  Coagulation profile No results  for input(s): INR, PROTIME in the last 168 hours.  No results for input(s): DDIMER in the last 72 hours.  Cardiac Enzymes  Recent Labs Lab 10/06/15 2246  TROPONINI  0.03   ------------------------------------------------------------------------------------------------------------------ Invalid input(s): POCBNP   CBG:  Recent Labs Lab 10/06/15 2342  GLUCAP 216*       EKG: Independently reviewed. Sinus rhythm with preventricular complexes and a right bundle branch block   Assessment/Plan Active Problems: Chest pain: Acute. Patient found to have normal EKG and initial troponin, but with risk factors which makes patient have a heart score of approximately 4. - Admit to stepdown for continued chest pain complaints - Consult cardiology in a.m. - NPO after 4am - Trending cardiac troponin - Echocardiogram in a.m. - Check lipid panel in a.m.  Elevated BNP: Patient's initial BNP was elevated at 177. Acute worsening of the patient's heart failure could be the cause no baseline BNP is available. - Lasix 20 mg IV 1 dose now - Reassess need for diuresis and change home Lasix dose if needed - Strict ins and outs  Possible COPD exacerbation: Acute. Patient reported shortness of breath worsening over the last week. End expiratory wheezes appreciated on physical exam. - DuoNebs every 6 hours and prn as needed - Budesonide and Brovana nebs every 12 hours  Chronic kidney disease stage stage IV: Patient's baseline creatinine is somewhere between 1.8- 2.1. On admission patient's creatinine near baseline at 2.17 - Continue to monitor   Coronary artery disease Status post CABG and stents: Last stents placed  in October 2016 - Continue aspirin and Plavix   Diabetes mellitus type 2: - Held glipizide - Check hemoglobin A1c - CBGs with a sensitive sliding scale of insulin every 4 hours  Tobacco abuse: Patient notes continuing to smoke cigarettes - Counseled on need of smoking  cessation - Offered nicotine patch while hospitalized  Code Status:   full Family Communication: bedside Disposition Plan: admit   Total time spent 55 minutes.Greater than 50% of this time was spent in counseling, explanation of diagnosis, planning of further management, and coordination of care  [facility] Pager [phone]  If 7PM-7AM, please contact night-coverage [email or web address] Password [facility] 10/07/2015, 12:25 AM

## 2015-10-08 ENCOUNTER — Ambulatory Visit (HOSPITAL_COMMUNITY): Payer: Self-pay

## 2015-10-08 ENCOUNTER — Observation Stay (HOSPITAL_BASED_OUTPATIENT_CLINIC_OR_DEPARTMENT_OTHER): Payer: Medicare Other

## 2015-10-08 DIAGNOSIS — N184 Chronic kidney disease, stage 4 (severe): Secondary | ICD-10-CM | POA: Diagnosis not present

## 2015-10-08 DIAGNOSIS — F17200 Nicotine dependence, unspecified, uncomplicated: Secondary | ICD-10-CM | POA: Diagnosis not present

## 2015-10-08 DIAGNOSIS — Z951 Presence of aortocoronary bypass graft: Secondary | ICD-10-CM | POA: Diagnosis not present

## 2015-10-08 DIAGNOSIS — E782 Mixed hyperlipidemia: Secondary | ICD-10-CM

## 2015-10-08 DIAGNOSIS — I739 Peripheral vascular disease, unspecified: Secondary | ICD-10-CM | POA: Diagnosis not present

## 2015-10-08 DIAGNOSIS — F17210 Nicotine dependence, cigarettes, uncomplicated: Secondary | ICD-10-CM | POA: Diagnosis not present

## 2015-10-08 DIAGNOSIS — R079 Chest pain, unspecified: Secondary | ICD-10-CM | POA: Diagnosis not present

## 2015-10-08 DIAGNOSIS — I10 Essential (primary) hypertension: Secondary | ICD-10-CM | POA: Diagnosis not present

## 2015-10-08 DIAGNOSIS — I2510 Atherosclerotic heart disease of native coronary artery without angina pectoris: Secondary | ICD-10-CM | POA: Diagnosis not present

## 2015-10-08 LAB — BASIC METABOLIC PANEL
Anion gap: 9 (ref 5–15)
BUN: 30 mg/dL — ABNORMAL HIGH (ref 6–20)
CHLORIDE: 104 mmol/L (ref 101–111)
CO2: 24 mmol/L (ref 22–32)
Calcium: 9.3 mg/dL (ref 8.9–10.3)
Creatinine, Ser: 2.17 mg/dL — ABNORMAL HIGH (ref 0.44–1.00)
GFR calc non Af Amer: 23 mL/min — ABNORMAL LOW (ref >60)
GFR, EST AFRICAN AMERICAN: 27 mL/min — AB (ref >60)
Glucose, Bld: 193 mg/dL — ABNORMAL HIGH (ref 65–99)
POTASSIUM: 5.1 mmol/L (ref 3.5–5.1)
SODIUM: 137 mmol/L (ref 135–145)

## 2015-10-08 LAB — GLUCOSE, CAPILLARY
GLUCOSE-CAPILLARY: 156 mg/dL — AB (ref 65–99)
GLUCOSE-CAPILLARY: 150 mg/dL — AB (ref 65–99)
Glucose-Capillary: 161 mg/dL — ABNORMAL HIGH (ref 65–99)

## 2015-10-08 LAB — HEMOGLOBIN A1C
Hgb A1c MFr Bld: 6.3 % — ABNORMAL HIGH (ref 4.8–5.6)
Mean Plasma Glucose: 134 mg/dL

## 2015-10-08 MED ORDER — ROSUVASTATIN CALCIUM 40 MG PO TABS: 40.0000 mg | ORAL_TABLET | Freq: Every day | ORAL | Status: DC

## 2015-10-08 MED ORDER — ISOSORBIDE MONONITRATE ER 30 MG PO TB24: 15.0000 mg | ORAL_TABLET | Freq: Every day | ORAL | Status: DC

## 2015-10-08 MED ORDER — ALBUTEROL SULFATE HFA 108 (90 BASE) MCG/ACT IN AERS: 2.0000 | INHALATION_SPRAY | RESPIRATORY_TRACT | Status: DC | PRN

## 2015-10-08 MED ORDER — FENOFIBRATE 160 MG PO TABS: 160.0000 mg | ORAL_TABLET | Freq: Every day | ORAL | Status: DC

## 2015-10-08 MED ORDER — BUDESONIDE-FORMOTEROL FUMARATE 160-4.5 MCG/ACT IN AERO: 2.0000 | INHALATION_SPRAY | Freq: Two times a day (BID) | RESPIRATORY_TRACT | Status: DC

## 2015-10-08 MED ORDER — OMEGA-3-ACID ETHYL ESTERS 1 G PO CAPS: 2.0000 g | ORAL_CAPSULE | Freq: Two times a day (BID) | ORAL | Status: DC

## 2015-10-08 NOTE — Progress Notes (Signed)
discharge teaching and instructions reviewed. Pt has no further questions. Prescriptions and belongings with pt. VSS.

## 2015-10-08 NOTE — Progress Notes (Signed)
Patient Name: [name] Date of Encounter: 10/08/2015   SUBJECTIVE  Chest tightness and breathing improved. However, at times gets worse.   CURRENT MEDS . amLODipine  10 mg Oral Daily  . arformoterol  15 mcg Nebulization BID  . aspirin  81 mg Oral Daily  . budesonide (PULMICORT) nebulizer solution  0.5 mg Nebulization BID  . carvedilol  25 mg Oral BID WC  . cloNIDine  0.2 mg Oral TID  . clopidogrel  75 mg Oral Q breakfast  . famotidine  20 mg Oral QAC supper  . fenofibrate  160 mg Oral Daily  . ferrous gluconate  325 mg Oral BID WC  . heparin  5,000 Units Subcutaneous 3 times per day  . hydrALAZINE  100 mg Oral TID  . insulin aspart  0-9 Units Subcutaneous 6 times per day  . isosorbide mononitrate  15 mg Oral Daily  . nicotine  21 mg Transdermal Daily  . omega-3 acid ethyl esters  2 g Oral BID  . pantoprazole  40 mg Oral BID  . pravastatin  80 mg Oral QHS  . predniSONE  40 mg Oral BID WC  . sodium chloride flush  3 mL Intravenous Q12H  . zolpidem  5 mg Oral QHS    OBJECTIVE  Filed Vitals:   10/07/15 1112 10/07/15 1944 10/08/15 0353 10/08/15 0912  BP: 115/53 134/51 171/67   Pulse: 57 74 70   Temp: 98.7 F (37.1 C) 98.2 F (36.8 C) 98.2 F (36.8 C)   TempSrc: Oral Oral Oral   Resp: 18 17 16    Height:      Weight:   150 lb 9.6 oz (68.312 kg)   SpO2: 98% 98% 97% 97%    Intake/Output Summary (Last 24 hours) at 10/08/15 1148 Last data filed at 10/08/15 1000  Gross per 24 hour  Intake    780 ml  Output   1200 ml  Net   -420 ml   Filed Weights   10/06/15 2203 10/07/15 0347 10/08/15 0353  Weight: 148 lb 1.6 oz (67.178 kg) 149 lb 11.2 oz (67.903 kg) 150 lb 9.6 oz (68.312 kg)    PHYSICAL EXAM  General: Pleasant, NAD. Neuro: Alert and oriented X 3. Moves all extremities spontaneously. Psych: Normal affect. HEENT:  Normal  Neck: Supple without bruits or JVD. Lungs:  Resp regular and unlabored, CTA. Heart: RRR no s3, s4, or murmurs. Abdomen: Soft,  non-tender, non-distended, BS + x 4.  Extremities: No clubbing, cyanosis or edema. DP/PT/Radials 2+ and equal bilaterally.  Accessory Clinical Findings  CBC  Recent Labs  10/06/15 1749 10/07/15 0155  WBC 10.3 8.6  HGB 12.2 10.3*  HCT 36.4 31.5*  MCV 91.0 90.8  PLT 277 [id number]   Basic Metabolic Panel  Recent Labs  10/07/15 0155 10/08/15 0535  NA 142 137  K 3.8 5.1  CL 104 104  CO2 25 24  GLUCOSE 204* 193*  BUN 24* 30*  CREATININE 2.24* 2.17*  CALCIUM 8.8* 9.3  Cardiac Enzymes  Recent Labs  10/06/15 2246 10/07/15 0155 10/07/15 0510  TROPONINI 0.03 0.04* 0.03     Recent Labs  10/07/15 0651  HGBA1C 6.3*   Fasting Lipid Panel  Recent Labs  10/07/15 0655  CHOL 181  HDL 27*  LDLCALC UNABLE TO CALCULATE IF TRIGLYCERIDE OVER 400 mg/dL  TRIG 430*  CHOLHDL 6.7    TELE  Sinus rhythm with PACs ans PVCs  Radiology/Studies  Dg Chest 2 View  10/06/2015  CLINICAL  DATA:  Chest pain EXAM: CHEST  2 VIEW COMPARISON:  07/04/2015 FINDINGS: Previous median sternotomy and CABG procedure. Mild cardiac enlargement. No pleural effusion or edema identified. No airspace consolidation. Spondylosis noted within the thoracic spine. IMPRESSION: 1. No acute cardiopulmonary abnormalities. 2. Thoracic spondylosis. Electronically Signed   By: Kerby Moors M.D.   On: 10/06/2015 18:20    ASSESSMENT AND PLAN  1. Chest tightness and SOB - Improved but not resolved. Diuresed negative 0.6L with IV Lasix 20 mg x 1. Weight up 1lb, Doubt accurate.  AT homes takes lasix 20mg  daily with extra 20mg  for additional weight gain. Consider resuming. Appears euvolemic.  - Echo has been done, pending reading. The patient is NPO for possible Myoview. Will let her eat, titrate meds after echo result, go home and outpatient nuc.   2. Coronary artery disease - Status post CABG and recent successful 06/2015 PCI of SVG to right PDA with DES.  - Continue ASA, plavix, BB. Imdur added yesterday. Not on ACE  due to CKD.   3. CKD, stable IV - baseline around 1.8-2.1. Creatinine stable.   4. HL - 10/07/2015: Cholesterol 181; HDL 27*; LDL Cholesterol UNABLE TO CALCULATE IF TRIGLYCERIDE OVER 400 mg/dL; Triglycerides 430*; VLDL UNABLE TO CALCULATE IF TRIGLYCERIDE OVER 400 mg/dL  -  Continue Pravastatin, finofibrate, Lovaza --> switch to lipitor as outpatient. Advice dietary importance.   5. Ongoing tobacco abuse - Discussed importance of smoking cessation.    Signed, Bhagat,Bhavinkumar PA-C Pager [phone]   ------------------------------------------------------------------- ECHO Study Conclusions   - Left ventricle: The cavity size was normal. Systolic function was normal. The estimated ejection fraction was in the range of 55% to 60%. 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 - Mitral valve: Mitral valve repair with annuloplasty ring intact. 24-mm ring 03/05/1011 Mean gradient (D): 10 mm Hg. Valve area by continuity equation (using LVOT flow): 1.06 cm^2.  Impressions:  - Compared to the prior study, there has been no significant interval change.   Patient seen and examined. Agree with assessment and plan.  Clinically, patient feels improved.  I reviewed her echo Doppler study, which shows preserved LV function with an ejection fraction at 55% with apical wall motion abnormality.  Her mitral valve repair with annuloplasty is stable.  There is no significant change in her mitral valve gradients.  Her renal function today is slightly improved with a creatinine at 2.17, GFR of 23, still consistent with stage IV chronic kidney disease.  Yesterday, she was started on Lovenox a 2 capsules twice a day in addition to resumption of fenofibrate for her marked hypertriglyceridemia.  I recommend discontinuing pravastatin and switching to either atorvastatin or Crestor.  4.  Hypodense he statin therapy with target LDL less than 70, and preferably lower.  The patient can be  discharged today with cardiology follow-up.  I again had a long discussion with the patient concerning absolute smoking cessation.    Troy Sine, MD, [facility]: [place] Rhonda W 10/08/2015 1:46 PM

## 2015-10-08 NOTE — Progress Notes (Deleted)
Physician Discharge Summary  [name] [id number] DOB: 04-Nov-1950 DOA: 10/06/2015  PCP: Phineas Inches, MD  Admit date: 10/06/2015 Discharge date: 10/08/2015  Recommendations for Outpatient Follow-up:  1.    Discharge Diagnoses:  Principal Problem:   Chest pain Active Problems:   TOBACCO ABUSE   Essential hypertension   CKD (chronic kidney disease)   COPD (chronic obstructive pulmonary disease) (HCC)   S/P CABG x 3   Elevated brain natriuretic peptide (BNP) level   Diabetes mellitus (HCC)   Pain in the chest   Discharge Condition: Stable  Diet recommendation: Carbohydrate modified heart healthy  Filed Weights   10/06/15 2203 10/07/15 0347 10/08/15 0353  Weight: 67.178 kg (148 lb 1.6 oz) 67.903 kg (149 lb 11.2 oz) 68.312 kg (150 lb 9.6 oz)    History of present illness:  [name] is 65 year old female with a past medical history significant for HTN, CHF last echo in 06/2014 EF 55-60% with primary distal dysfunction, COPD, CAD s/p Cabg s/p stents placed10/2016, and MV repair; who presents with chest discomfort. Symptoms have been intermittent over the last week. Chest discomfort is located moreso on the left side than on the right. Describes also chest tightness/ heaviness. Symptoms occur usually when sitting. Reports associated symptoms of shortness of breath with exertion, palpitations, and mild leg swelling. Patient notes that she has previously been on albuterol, but due to finances has not been able to obtain this medication. She reports continuing to take her Symbicort daily. Patient notes that she normally checks her weight and is usually around 144-145 pounds, but had increased 147.7 pounds. She tried taking an extra lasix dose over the last 3 days. She did not notice any change in her weight. Patient still notes chest pain despite being tried on a breathing treatment nitroglycerin. Denies any fevers, chills, nausea, vomiting, recent sick contacts, or recent travel. She  notes that her kidney function has been decreased since she had dye for cardiac cath back in 06/2015 where she had had 2 stents placed.  Upon arrival to the emergency department patient was evaluated with a EKG which shows sinus rhythm with PVCs and right bundle branch blocks pretty similar to previous EKG and Troponins were negative. Patient was admitted to stepdown due to continued complaints of chest pain.   Hospital Course:  Patient was admitted to telemetry. MI ruled out. Echocardiogram showed normal ejection fraction with apical wall motion abnormality mitral valve repair with annuloplasty is stable. Cardiology consulted and recommended adding imdur, lovaza, changing pravastatin to Crestor, cleared for discharge and follow-up with cardiology to consider outpatient Myoview.  Procedures:  none  Consultations:  cardiology  Discharge Exam: Filed Vitals:   10/07/15 1944 10/08/15 0353  BP: 134/51 171/67  Pulse: 74 70  Temp: 98.2 F (36.8 C) 98.2 F (36.8 C)  Resp: 17 16    General: a and o Cardiovascular: RRR Respiratory: CTA  Discharge Instructions   Discharge Instructions    Diet - low sodium heart healthy    Complete by:  As directed      Diet Carb Modified    Complete by:  As directed      Increase activity slowly    Complete by:  As directed             Medication List    STOP taking these medications        pravastatin 80 MG tablet  Commonly known as:  PRAVACHOL      TAKE these medications  albuterol 108 (90 Base) MCG/ACT inhaler  Commonly known as:  PROVENTIL HFA;VENTOLIN HFA  Inhale 2 puffs into the lungs every 4 (four) hours as needed for wheezing or shortness of breath. ProAir     amLODipine 10 MG tablet  Commonly known as:  NORVASC  Take 10 mg by mouth daily.     aspirin 81 MG chewable tablet  Chew 1 tablet (81 mg total) by mouth daily.     budesonide-formoterol 160-4.5 MCG/ACT inhaler  Commonly known as:  SYMBICORT  Inhale 2 puffs  into the lungs 2 (two) times daily.     carvedilol 25 MG tablet  Commonly known as:  COREG  Take 1 tablet (25 mg total) by mouth 2 (two) times daily with a meal.     cloNIDine 0.2 MG tablet  Commonly known as:  CATAPRES  Take 1 tablet (0.2 mg total) by mouth 3 (three) times daily.     clopidogrel 75 MG tablet  Commonly known as:  PLAVIX  Take 1 tablet (75 mg total) by mouth daily with breakfast.     famotidine 20 MG tablet  Commonly known as:  PEPCID  Take 1 tablet (20 mg total) by mouth daily before supper.     fenofibrate 160 MG tablet  Take 1 tablet (160 mg total) by mouth daily.     ferrous gluconate 325 MG tablet  Commonly known as:  FERGON  Take 325 mg by mouth 2 (two) times daily.     furosemide 20 MG tablet  Commonly known as:  LASIX  Take 20mg  by mouth daily, may take one additional tablet as needed for weight gain     glipiZIDE 5 MG 24 hr tablet  Commonly known as:  GLUCOTROL XL  Take 5 mg by mouth at bedtime.     hydrALAZINE 100 MG tablet  Commonly known as:  APRESOLINE  TAKE 1 TABLET BY MOUTH THREE TIMES DAILY     HYDROcodone-acetaminophen 5-325 MG tablet  Commonly known as:  NORCO/VICODIN  Take 1 tablet by mouth 3 (three) times daily as needed for moderate pain.     isosorbide mononitrate 30 MG 24 hr tablet  Commonly known as:  IMDUR  Take 0.5 tablets (15 mg total) by mouth daily.     omega-3 acid ethyl esters 1 g capsule  Commonly known as:  LOVAZA  Take 2 capsules (2 g total) by mouth 2 (two) times daily.     pantoprazole 40 MG tablet  Commonly known as:  PROTONIX  Take 1 tablet (40 mg total) by mouth daily.     rosuvastatin 40 MG tablet  Commonly known as:  CRESTOR  Take 1 tablet (40 mg total) by mouth daily.     zolpidem 10 MG tablet  Commonly known as:  AMBIEN  Take 10 mg by mouth at bedtime.       Allergies  Allergen Reactions  . Imdur [Isosorbide Dinitrate] Other (See Comments)    "felt like my insides were going to explode"  .  Codeine Itching  . Contrast Media [Iodinated Diagnostic Agents] Itching and Other (See Comments)    Itching of feet  . Ioxaglate Itching and Other (See Comments)    Itching of feet  . Metrizamide Itching and Other (See Comments)    Itching of feet  . Percocet [Oxycodone-Acetaminophen] Itching and Other (See Comments)    Tolerates Hydrocodone      The results of significant diagnostics from this hospitalization (including imaging, microbiology, ancillary and laboratory) are  listed below for reference.    Significant Diagnostic Studies: Dg Chest 2 View  10/06/2015  CLINICAL DATA:  Chest pain EXAM: CHEST  2 VIEW COMPARISON:  07/04/2015 FINDINGS: Previous median sternotomy and CABG procedure. Mild cardiac enlargement. No pleural effusion or edema identified. No airspace consolidation. Spondylosis noted within the thoracic spine. IMPRESSION: 1. No acute cardiopulmonary abnormalities. 2. Thoracic spondylosis. Electronically Signed   By: Kerby Moors M.D.   On: 10/06/2015 18:20   Echo Left ventricle: The cavity size was normal. Systolic function was normal. The estimated ejection fraction was in the range of 55% to 60%. 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 - Mitral valve: Mitral valve repair with annuloplasty ring intact. 24-mm ring 03/05/1011 Mean gradient (D): 10 mm Hg. Valve area by continuity equation (using LVOT flow): 1.06 cm^2.  Impressions:  - Compared to the prior study, there has been no significant interval change. Microbiology: Recent Results (from the past 240 hour(s))  MRSA PCR Screening     Status: None   Collection Time: 10/06/15 10:21 PM  Result Value Ref Range Status   MRSA by PCR NEGATIVE NEGATIVE Final    Comment:        The GeneXpert MRSA Assay (FDA approved for NASAL specimens only), is one component of a comprehensive MRSA colonization surveillance program. It is not intended to diagnose MRSA infection nor to guide or monitor  treatment for MRSA infections.      Labs: Basic Metabolic Panel:  Recent Labs Lab 10/06/15 1749 10/07/15 0155 10/08/15 0535  NA 144 142 137  K 3.9 3.8 5.1  CL 103 104 104  CO2 24 25 24   GLUCOSE 197* 204* 193*  BUN 20 24* 30*  CREATININE 2.17* 2.24* 2.17*  CALCIUM 9.7 8.8* 9.3   Liver Function Tests: No results for input(s): AST, ALT, ALKPHOS, BILITOT, PROT, ALBUMIN in the last 168 hours. No results for input(s): LIPASE, AMYLASE in the last 168 hours. No results for input(s): AMMONIA in the last 168 hours. CBC:  Recent Labs Lab 10/06/15 1749 10/07/15 0155  WBC 10.3 8.6  HGB 12.2 10.3*  HCT 36.4 31.5*  MCV 91.0 90.8  PLT 277 207   Cardiac Enzymes:  Recent Labs Lab 10/06/15 2246 10/07/15 0155 10/07/15 0510  TROPONINI 0.03 0.04* 0.03   BNP: BNP (last 3 results)  Recent Labs  10/06/15 1749  BNP 177.9*    ProBNP (last 3 results) No results for input(s): PROBNP in the last 8760 hours.  CBG:  Recent Labs Lab 10/07/15 1947 10/07/15 2254 10/08/15 0352 10/08/15 0802 10/08/15 1140  GLUCAP 254* 172* 161* 156* 150*       Signed:  Delfina Redwood MD [facility] 10/08/2015, 1:32 PM

## 2015-10-08 NOTE — Progress Notes (Signed)
Results for TANIYLA, DEARDEN (MRN [id number]) as of 10/08/2015 12:54  Ref. Range 10/07/2015 22:54 10/08/2015 03:52 10/08/2015 08:02 10/08/2015 11:40  Glucose-Capillary Latest Ref Range: 65-99 mg/dL 172 (H) 161 (H) 156 (H) 150 (H)  Noted that blood sugars much better. Recommend changing Novolog SENSITIVE correction scale to TID & HS since patient is eating. Will continue to monitor blood sugars while in the hospital. Harvel Ricks RN BSN CDE

## 2015-10-08 NOTE — Progress Notes (Signed)
  Echocardiogram 2D Echocardiogram has been performed.  Jennette Dubin 10/08/2015, 10:31 AM

## 2015-10-10 ENCOUNTER — Ambulatory Visit (HOSPITAL_COMMUNITY): Payer: Self-pay

## 2015-10-13 ENCOUNTER — Ambulatory Visit (HOSPITAL_COMMUNITY): Payer: Self-pay

## 2015-10-15 ENCOUNTER — Ambulatory Visit (HOSPITAL_COMMUNITY): Payer: Self-pay

## 2015-10-17 ENCOUNTER — Ambulatory Visit (HOSPITAL_COMMUNITY): Payer: Self-pay

## 2015-10-17 NOTE — Discharge Summary (Signed)
Amy Redwood, MD Physician Signed Internal Medicine Progress Notes 10/08/2015 1:31 PM    Expand All Collapse All   Physician Discharge Summary  [name] [id number] DOB: May 05, 1951 DOA: 10/06/2015  PCP: Phineas Inches, MD  Admit date: 10/06/2015 Discharge date: 10/08/2015  Recommendations for Outpatient Follow-up:  1.    Discharge Diagnoses:  Principal Problem:  Chest pain Active Problems:  TOBACCO ABUSE  Essential hypertension  CKD (chronic kidney disease)  COPD (chronic obstructive pulmonary disease) (HCC)  S/P CABG x 3  Elevated brain natriuretic peptide (BNP) level  Diabetes mellitus (HCC)  Pain in the chest   Discharge Condition: Stable  Diet recommendation: Carbohydrate modified heart healthy  Filed Weights   10/06/15 2203 10/07/15 0347 10/08/15 0353  Weight: 67.178 kg (148 lb 1.6 oz) 67.903 kg (149 lb 11.2 oz) 68.312 kg (150 lb 9.6 oz)    History of present illness:  [name] is 65 year old female with a past medical history significant for HTN, CHF last echo in 06/2014 EF 55-60% with primary distal dysfunction, COPD, CAD s/p Cabg s/p stents placed10/2016, and MV repair; who presents with chest discomfort. Symptoms have been intermittent over the last week. Chest discomfort is located moreso on the left side than on the right. Describes also chest tightness/ heaviness. Symptoms occur usually when sitting. Reports associated symptoms of shortness of breath with exertion, palpitations, and mild leg swelling. Patient notes that she has previously been on albuterol, but due to finances has not been able to obtain this medication. She reports continuing to take her Symbicort daily. Patient notes that she normally checks her weight and is usually around 144-145 pounds, but had increased 147.7 pounds. She tried taking an extra lasix dose over the last 3 days. She did not notice any change in her weight. Patient still notes chest pain despite  being tried on a breathing treatment nitroglycerin. Denies any fevers, chills, nausea, vomiting, recent sick contacts, or recent travel. She notes that her kidney function has been decreased since she had dye for cardiac cath back in 06/2015 where she had had 2 stents placed.  Upon arrival to the emergency department patient was evaluated with a EKG which shows sinus rhythm with PVCs and right bundle branch blocks pretty similar to previous EKG and Troponins were negative. Patient was admitted to stepdown due to continued complaints of chest pain.   Hospital Course:  Patient was admitted to telemetry. MI ruled out. Echocardiogram showed normal ejection fraction with apical wall motion abnormality mitral valve repair with annuloplasty is stable. Cardiology consulted and recommended adding imdur, lovaza, changing pravastatin to Crestor, cleared for discharge and follow-up with cardiology to consider outpatient Myoview.  Procedures:  none  Consultations:  cardiology  Discharge Exam: Filed Vitals:   10/07/15 1944 10/08/15 0353  BP: 134/51 171/67  Pulse: 74 70  Temp: 98.2 F (36.8 C) 98.2 F (36.8 C)  Resp: 17 16    General: a and o Cardiovascular: RRR Respiratory: CTA  Discharge Instructions   Discharge Instructions    Diet - low sodium heart healthy  Complete by: As directed      Diet Carb Modified  Complete by: As directed      Increase activity slowly  Complete by: As directed             Medication List    STOP taking these medications       pravastatin 80 MG tablet  Commonly known as: PRAVACHOL      TAKE these  medications       albuterol 108 (90 Base) MCG/ACT inhaler  Commonly known as: PROVENTIL HFA;VENTOLIN HFA  Inhale 2 puffs into the lungs every 4 (four) hours as needed for wheezing or shortness of breath. ProAir     amLODipine 10 MG tablet  Commonly known as: NORVASC  Take 10 mg  by mouth daily.     aspirin 81 MG chewable tablet  Chew 1 tablet (81 mg total) by mouth daily.     budesonide-formoterol 160-4.5 MCG/ACT inhaler  Commonly known as: SYMBICORT  Inhale 2 puffs into the lungs 2 (two) times daily.     carvedilol 25 MG tablet  Commonly known as: COREG  Take 1 tablet (25 mg total) by mouth 2 (two) times daily with a meal.     cloNIDine 0.2 MG tablet  Commonly known as: CATAPRES  Take 1 tablet (0.2 mg total) by mouth 3 (three) times daily.     clopidogrel 75 MG tablet  Commonly known as: PLAVIX  Take 1 tablet (75 mg total) by mouth daily with breakfast.     famotidine 20 MG tablet  Commonly known as: PEPCID  Take 1 tablet (20 mg total) by mouth daily before supper.     fenofibrate 160 MG tablet  Take 1 tablet (160 mg total) by mouth daily.     ferrous gluconate 325 MG tablet  Commonly known as: FERGON  Take 325 mg by mouth 2 (two) times daily.     furosemide 20 MG tablet  Commonly known as: LASIX  Take 20mg  by mouth daily, may take one additional tablet as needed for weight gain     glipiZIDE 5 MG 24 hr tablet  Commonly known as: GLUCOTROL XL  Take 5 mg by mouth at bedtime.     hydrALAZINE 100 MG tablet  Commonly known as: APRESOLINE  TAKE 1 TABLET BY MOUTH THREE TIMES DAILY     HYDROcodone-acetaminophen 5-325 MG tablet  Commonly known as: NORCO/VICODIN  Take 1 tablet by mouth 3 (three) times daily as needed for moderate pain.     isosorbide mononitrate 30 MG 24 hr tablet  Commonly known as: IMDUR  Take 0.5 tablets (15 mg total) by mouth daily.     omega-3 acid ethyl esters 1 g capsule  Commonly known as: LOVAZA  Take 2 capsules (2 g total) by mouth 2 (two) times daily.     pantoprazole 40 MG tablet  Commonly known as: PROTONIX  Take 1 tablet (40 mg total) by mouth daily.     rosuvastatin 40 MG tablet  Commonly known as: CRESTOR  Take 1  tablet (40 mg total) by mouth daily.     zolpidem 10 MG tablet  Commonly known as: AMBIEN  Take 10 mg by mouth at bedtime.       Allergies  Allergen Reactions  . Imdur [Isosorbide Dinitrate] Other (See Comments)    "felt like my insides were going to explode"  . Codeine Itching  . Contrast Media [Iodinated Diagnostic Agents] Itching and Other (See Comments)    Itching of feet  . Ioxaglate Itching and Other (See Comments)    Itching of feet  . Metrizamide Itching and Other (See Comments)    Itching of feet  . Percocet [Oxycodone-Acetaminophen] Itching and Other (See Comments)    Tolerates Hydrocodone       The results of significant diagnostics from this hospitalization (including imaging, microbiology, ancillary and laboratory) are listed below for reference.    Significant Diagnostic  Studies:  Imaging Results    Dg Chest 2 View  10/06/2015 CLINICAL DATA: Chest pain EXAM: CHEST 2 VIEW COMPARISON: 07/04/2015 FINDINGS: Previous median sternotomy and CABG procedure. Mild cardiac enlargement. No pleural effusion or edema identified. No airspace consolidation. Spondylosis noted within the thoracic spine. IMPRESSION: 1. No acute cardiopulmonary abnormalities. 2. Thoracic spondylosis. Electronically Signed By: Kerby Moors M.D. On: 10/06/2015 18:20    Echo Left ventricle: The cavity size was normal. Systolic function was normal. The estimated ejection fraction was in the range of 55% to 60%. 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 - Mitral valve: Mitral valve repair with annuloplasty ring intact. 24-mm ring 03/05/1011 Mean gradient (D): 10 mm Hg. Valve area by continuity equation (using LVOT flow): 1.06 cm^2.  Impressions:  - Compared to the prior study, there has been no significant interval change. Microbiology: Recent Results (from the past 240 hour(s))  MRSA PCR Screening Status: None    Collection Time: 10/06/15 10:21 PM  Result Value Ref Range Status   MRSA by PCR NEGATIVE NEGATIVE Final    Comment:   The GeneXpert MRSA Assay (FDA approved for NASAL specimens only), is one component of a comprehensive MRSA colonization surveillance program. It is not intended to diagnose MRSA infection nor to guide or monitor treatment for MRSA infections.      Labs: Basic Metabolic Panel:  Last Labs      Recent Labs Lab 10/06/15 1749 10/07/15 0155 10/08/15 0535  NA 144 142 137  K 3.9 3.8 5.1  CL 103 104 104  CO2 24 25 24   GLUCOSE 197* 204* 193*  BUN 20 24* 30*  CREATININE 2.17* 2.24* 2.17*  CALCIUM 9.7 8.8* 9.3     Liver Function Tests:  Last Labs     No results for input(s): AST, ALT, ALKPHOS, BILITOT, PROT, ALBUMIN in the last 168 hours.    Last Labs     No results for input(s): LIPASE, AMYLASE in the last 168 hours.    Last Labs     No results for input(s): AMMONIA in the last 168 hours.   CBC:  Last Labs      Recent Labs Lab 10/06/15 1749 10/07/15 0155  WBC 10.3 8.6  HGB 12.2 10.3*  HCT 36.4 31.5*  MCV 91.0 90.8  PLT 277 207     Cardiac Enzymes:  Last Labs      Recent Labs Lab 10/06/15 2246 10/07/15 0155 10/07/15 0510  TROPONINI 0.03 0.04* 0.03     BNP: BNP (last 3 results)  Recent Labs (within last 365 days)     Recent Labs  10/06/15 1749  BNP 177.9*      ProBNP (last 3 results)  Recent Labs (within last 365 days)    No results for input(s): PROBNP in the last 8760 hours.    CBG:  Last Labs      Recent Labs Lab 10/07/15 1947 10/07/15 2254 10/08/15 0352 10/08/15 0802 10/08/15 1140  GLUCAP 254* 172* 161* 156* 150*         Signed:  Delfina Redwood MD [facility] 10/08/2015, 1:32 PM

## 2015-10-20 ENCOUNTER — Ambulatory Visit (HOSPITAL_COMMUNITY): Payer: Self-pay

## 2015-10-22 ENCOUNTER — Ambulatory Visit (HOSPITAL_COMMUNITY): Payer: Self-pay

## 2015-10-24 ENCOUNTER — Ambulatory Visit (HOSPITAL_COMMUNITY): Payer: Self-pay

## 2015-10-27 ENCOUNTER — Ambulatory Visit (HOSPITAL_COMMUNITY): Payer: Self-pay

## 2015-10-27 ENCOUNTER — Encounter: Payer: Self-pay | Admitting: Cardiovascular Disease

## 2015-10-27 ENCOUNTER — Ambulatory Visit (INDEPENDENT_AMBULATORY_CARE_PROVIDER_SITE_OTHER): Payer: Medicare Other | Admitting: Cardiovascular Disease

## 2015-10-27 VITALS — BP 130/62 | HR 55 | Ht 65.0 in | Wt 149.8 lb

## 2015-10-27 DIAGNOSIS — I5042 Chronic combined systolic (congestive) and diastolic (congestive) heart failure: Secondary | ICD-10-CM

## 2015-10-27 NOTE — Patient Instructions (Signed)
Medication Instructions:  Your physician recommends that you continue on your current medications as directed. Please refer to the Current Medication list given to you today.  Labwork: No new orders.   Testing/Procedures: No new orders.   Follow-Up: Your physician wants you to follow-up in: 6 MONTHS with Dr Cooper.  You will receive a reminder letter in the mail two months in advance. If you don't receive a letter, please call our office to schedule the follow-up appointment.   Any Other Special Instructions Will Be Listed Below (If Applicable).     If you need a refill on your cardiac medications before your next appointment, please call your pharmacy.   

## 2015-10-27 NOTE — Progress Notes (Signed)
Cardiology Office Note Date:  10/29/2015   ID:  Terie, Schobel July 19, 1951, MRN [id number]  PCP:  Phineas Inches, MD  Cardiologist:  Sherren Mocha, MD    Chief Complaint  Patient presents with  . Shortness of Breath   History of Present Illness: [name] is a 65 y.o. female who presents for  Follow-up evaluation. She's been followed for peripheral arterial disease and now is primarily followed by vascular surgery. She's had multiple renal artery stenting procedures, carotid endarterectomy, failed lower extremity bypass, surveillance for abdominal aortic aneurysm, and left subclavian stenting. The patient has undergone CABG and cryo-Cox Maze repair with maze surgery and left atrial appendage clipping.  In October 2016 she underwent left subclavian stenting to improve inflow into her LIMA graft. She also underwent PCI of the saphenous vein graft to right PDA.  She reports no recent change in symptoms. Overall she feels stable. She has chronic exertional dyspnea. The patient continues to smoke and has struggled with this for many years. She feels underlying depression has contributed significantly to her inability to stop smoking. She denies recent chest pain or pressure. She denies edema, orthopnea, or PND. Her left arm pain has improved dramatically since undergoing subclavian stenting.   Past Medical History  Diagnosis Date  . Coronary artery disease 5/09    s/p CABG 7/12   L-LAD, S-OM, S-PDA  . PAD (peripheral artery disease) (HCC)     Severe; s/p bilateral renal artery stents, moderate in-stent restenosis  . AAA (abdominal aortic aneurysm) ([place]) 5/09    3.7x3.3 by u/s 2009  . carotid stenosis 5/09    S/p L CEA ;  60-79% bilat ICA by preCABG dopplers 7/12  . Hypertension   . Hyperlipidemia   . COPD (chronic obstructive pulmonary disease) ([place])   . GERD (gastroesophageal reflux disease)     With hiatal hernia  . Paroxysmal atrial fibrillation (HCC)     coumadin;  echo  9/07: EF 60%, mild LVH;  s/p Cox Maze 7/12 with LAA clipping  . GIB (gastrointestinal bleeding) 9/11    S/p EGD with cautery at [facility]  . Abnormal EKG     deep TW inversions chronic  . CKD (chronic kidney disease)   . Mitral regurgitation     3+ MR by intraoperative TEE;  s/p MV repair with Dr. Roxy Manns 7/12  . Anemia   . CHF (congestive heart failure) ([place])   . Complication of anesthesia 1966    "problem w/ether"  . Heart murmur   . CAD (coronary artery disease)     a. CABG 03/2011 LIMA to LAD, SVG to OM, SVG to PDA. b. cath 06/25/2015 DES to SVG to rPDA, patent LIMA to LAD, patent SVG to OM  . Myocardial infarction [facility]) 2012 "several"  . Chronic bronchitis ([place])     "get it pretty much q yr" (06/24/2015)  . On home oxygen therapy     "2 liters at night; negative for sleep apnea"  . Type II diabetes mellitus ([place])   . History of blood transfusion "a few times"    "all related to anemia" (06/24/2015)  . History of hiatal hernia   . Chronic back pain     "all over" (06/24/2015)  . Depression   . Subclavian artery stenosis, left     stented by Dr. Trula Slade on 10/18 to help flow of her LIMA to LAD    Past Surgical History  Procedure Laterality Date  . Carotid endarterectomy Left   .  Laceration repair Right 1990's    WRIST  . Right femoral-popliteal bypass    . Coronary artery bypass graft  03/05/2011    CABG X3 (LIMA to LAD, SVG to OM, SVG to PDA, EVH via left thigh  . Mitral valve repair  03/05/2011    84mm Memo 3D ring annuloplasty for ischemic MR  . Maze  03/05/2011    complete biatrial lesion set with clipping of LA appendage  . Bowel resection  2013  . Renal angiogram Bilateral 09/11/2013    Procedure: RENAL ANGIOGRAM;  Surgeon: Serafina Mitchell, MD;  Location[facility] CATH LAB;  Service: Cardiovascular;  Laterality: Bilateral;  . Peripheral vascular catheterization N/A 06/24/2015    Procedure: Aortic Arch Angiography;  Surgeon: Serafina Mitchell, MD;  Location: [place] CV LAB;   Service: Cardiovascular;  Laterality: N/A;  . Peripheral vascular catheterization  06/24/2015    Procedure: Peripheral Vascular Intervention;  Surgeon: Serafina Mitchell, MD;  Location: [place] CV LAB;  Service: Cardiovascular;;  . Peripheral vascular catheterization N/A 06/24/2015    Procedure: Abdominal Aortogram;  Surgeon: Serafina Mitchell, MD;  Location: [place] CV LAB;  Service: Cardiovascular;  Laterality: N/A;  . Cardiac catheterization N/A 06/24/2015    Procedure: Left Heart Cath and Cors/Grafts Angiography;  Surgeon: Leonie Man, MD;  Location: [place] CV LAB;  Service: Cardiovascular;  Laterality: N/A;  . Appendectomy  Aug. 11, 2016    Ruptured  . Abdominal hysterectomy  1990's  . Coronary angioplasty    . Cardiac catheterization N/A 06/25/2015    Procedure: Coronary Stent Intervention;  Surgeon: Sherren Mocha, MD;  Location: [place] CV LAB;  Service: Cardiovascular;  Laterality: N/A;  . Coronary stent placement      Current Outpatient Prescriptions  Medication Sig Dispense Refill  . albuterol (PROVENTIL HFA;VENTOLIN HFA) 108 (90 Base) MCG/ACT inhaler Inhale 2 puffs into the lungs every 4 (four) hours as needed for wheezing or shortness of breath. ProAir 1 each 0  . ALPRAZolam (XANAX) 0.5 MG tablet Take 0.5 mg by mouth 2 (two) times daily as needed. (anxiety)    . amLODipine (NORVASC) 10 MG tablet Take 10 mg by mouth daily.      Marland Kitchen aspirin 81 MG chewable tablet Chew 1 tablet (81 mg total) by mouth daily.    . carvedilol (COREG) 25 MG tablet Take 1 tablet (25 mg total) by mouth 2 (two) times daily with a meal. 60 tablet 11  . cloNIDine (CATAPRES) 0.2 MG tablet Take 1 tablet (0.2 mg total) by mouth 3 (three) times daily. 90 tablet 11  . clopidogrel (PLAVIX) 75 MG tablet Take 1 tablet (75 mg total) by mouth daily with breakfast. 90 tablet 3  . famotidine (PEPCID) 20 MG tablet Take 1 tablet (20 mg total) by mouth daily before supper. 30 tablet 6  . fenofibrate 160 MG  tablet Take 1 tablet (160 mg total) by mouth daily. 30 tablet 1  . ferrous gluconate (FERGON) 325 MG tablet Take 325 mg by mouth 2 (two) times daily.     . furosemide (LASIX) 20 MG tablet Take 20 mg by mouth daily. (May take one (1) additional tablet by mouth daily as needed for weight gain.)  6  . glipiZIDE (GLUCOTROL XL) 5 MG 24 hr tablet Take 5 mg by mouth at bedtime.     . hydrALAZINE (APRESOLINE) 100 MG tablet TAKE 1 TABLET BY MOUTH THREE TIMES DAILY (Patient taking differently: TAKE 100 MG BY MOUTH THREE TIMES  DAILY) 90 tablet 0  . HYDROcodone-acetaminophen (NORCO/VICODIN) 5-325 MG per tablet Take 1 tablet by mouth 3 (three) times daily as needed for moderate pain.     . isosorbide mononitrate (IMDUR) 30 MG 24 hr tablet Take 0.5 tablets (15 mg total) by mouth daily. 15 tablet 1  . omega-3 acid ethyl esters (LOVAZA) 1 g capsule Take 2 capsules (2 g total) by mouth 2 (two) times daily. 30 capsule 1  . pantoprazole (PROTONIX) 40 MG tablet Take 1 tablet (40 mg total) by mouth daily. 30 tablet 11  . pravastatin (PRAVACHOL) 80 MG tablet Take 80 mg by mouth daily.  3  . sertraline (ZOLOFT) 50 MG tablet Take 50 mg by mouth daily.    Marland Kitchen zolpidem (AMBIEN) 10 MG tablet Take 10 mg by mouth at bedtime.     No current facility-administered medications for this visit.    Allergies:   Imdur; Codeine; Contrast media; Ioxaglate; Metrizamide; and Percocet   Social History:  The patient  reports that she has been smoking Cigarettes.  She has a 50 pack-year smoking history. She has never used smokeless tobacco. She reports that she drinks alcohol. She reports that she does not use illicit drugs.   Family History:  The patient's  family history includes AAA (abdominal aortic aneurysm) in her mother; Diabetes in her brother; Emphysema in her father and mother; Heart attack in her brother and mother; Heart attack (age of onset: 14) in her father; Heart disease in her brother, father, mother, and other;  Hyperlipidemia in her brother, father, and mother; Hypertension in her brother, father, and mother; Peripheral vascular disease in her father.    ROS:  Please see the history of present illness.  Otherwise, review of systems is positive for shortness of breath, depression, back pain, fatigue, heart palpitations, snoring, anxiety.  All other systems are reviewed and negative.    PHYSICAL EXAM: VS:  BP 130/62 mmHg  Pulse 55  Ht 5\' 5"  (1.651 m)  Wt 67.949 kg (149 lb 12.8 oz)  BMI 24.93 kg/m2 , BMI Body mass index is 24.93 kg/(m^2). GEN: Well nourished, well developed, in no acute distress HEENT: normal Neck: no JVD, no masses. Bilateral carotid bruits L > R Cardiac: RRR without murmur or gallop                Respiratory:  clear to auscultation bilaterally, normal work of breathing GI: soft, nontender, nondistended, + BS MS: no deformity or atrophy Ext: no pretibial edema Skin: warm and dry, no rash Neuro:  Strength and sensation are intact Psych: euthymic mood, full affect  EKG:  EKG is ordered today.  Recent Labs: 10/06/2015: B Natriuretic Peptide 177.9* 10/07/2015: Hemoglobin 10.3*; Platelets 207 10/08/2015: BUN 30*; Creatinine, Ser 2.17*; Potassium 5.1; Sodium 137   Lipid Panel     Component Value Date/Time   CHOL 181 10/07/2015 0655   TRIG 430* 10/07/2015 0655   HDL 27* 10/07/2015 0655   CHOLHDL 6.7 10/07/2015 0655   VLDL UNABLE TO CALCULATE IF TRIGLYCERIDE OVER 400 mg/dL 10/07/2015 0655   LDLCALC UNABLE TO CALCULATE IF TRIGLYCERIDE OVER 400 mg/dL 10/07/2015 0655   LDLDIRECT 78.9 05/07/2008 1301      Wt Readings from Last 3 Encounters:  10/27/15 67.949 kg (149 lb 12.8 oz)  10/08/15 68.312 kg (150 lb 9.6 oz)  07/29/15 66.225 kg (146 lb)     Cardiac Studies Reviewed: Echo 10/08/2015: Study Conclusions  - Left ventricle: The cavity size was normal. Systolic function was normal.  The estimated ejection fraction was in the range of 55% to 60%. There is aneurysmal  deformity of the apical myocardium. - Mitral valve: Mitral valve repair with annuloplasty ring intact. 24-mm ring 03/05/1011 Mean gradient (D): 10 mm Hg. Valve area by continuity equation (using LVOT flow): 1.06 cm^2.  Impressions:  - Compared to the prior study, there has been no significant interval change.  LHC 06/25/15 1. Widely patent LIMA-LAD graft 2. Successful PCI of the SVG-right PDA with a DES using distal embolic protection with a Spider device - 3 12 mm Promus DES  LHC 06/24/15 LM: 100% LAD: Mid 100% RI: ok LCx: ok RCA: Ostial 85%, prox 55% S-RPDA 90% S-OM1 ok L-LAD ok The patient has severe native vessel disease as described with left main occlusion and ostial RCA stenosis that is severe.  She has severe stenosis of the proximal SVG-RCA that will require PCI. Fortunately she has elevated creatinine level and we were unable to visualize the LIMA graft.    PV A-gram 06/24/15 Impression: #1 80% left subclavian artery stenosis successfully treated using a 8 x 18 balloon expandable stent with residual stenosis less than 10% #2 type I aortic arch  Carotid US 10/4//16 R 40-59% L CEA patent with 40-59% L subclavian steal  Abdominal US 12/2014 Distal AAA 4.3 cm   ASSESSMENT AND PLAN: 1.  CAD, native vessel and bypass graft disease, currently without angina. The patient is status post left subclavian stenting to improve inflow into her LIMA graft as well as stenting of the saphenous vein graft. She will continue on dual antiplatelet therapy and aggressive medical program as outlined above.  2. Mitral valve disorder status post mitral valve repair: Stable by most recent echo.  3. Subclavian steal syndrome: Resolved after subclavian stenting  4. Abdominal aortic aneurysm without rupture: Followed by vascular surgery  5. Carotid stenosis without history of stroke: Followed by vascular surgery  6. Hypertension with heart failure:  Stable on current treatment  7. Tobacco abuse: Long discussion today. She feels depression has been a big part of her inability to stop smoking. She's started Zoloft and has xanax for prn use now available to her. She is going to set a stop date in the near future. Motivated to quit.   Current medicines are reviewed with the patient today.  The patient does not have concerns regarding medicines.  Labs/ tests ordered today include:  No orders of the defined types were placed in this encounter.   Disposition:   FU 6 months  Signed, Sherren Mocha, MD  10/29/2015 12:19 AM    [facility] [place], [place], [place]  [address] Phone: [phone]; Fax: [phone]

## 2015-10-29 ENCOUNTER — Ambulatory Visit (HOSPITAL_COMMUNITY): Payer: Self-pay

## 2015-10-31 ENCOUNTER — Ambulatory Visit (HOSPITAL_COMMUNITY): Payer: Self-pay

## 2015-11-03 ENCOUNTER — Other Ambulatory Visit: Payer: Self-pay | Admitting: Nurse Practitioner

## 2015-11-03 ENCOUNTER — Ambulatory Visit (HOSPITAL_COMMUNITY): Payer: Self-pay

## 2015-11-05 ENCOUNTER — Ambulatory Visit (HOSPITAL_COMMUNITY): Payer: Self-pay

## 2015-11-07 ENCOUNTER — Ambulatory Visit (HOSPITAL_COMMUNITY): Payer: Self-pay

## 2015-11-10 ENCOUNTER — Ambulatory Visit (HOSPITAL_COMMUNITY): Payer: Self-pay

## 2015-11-12 ENCOUNTER — Ambulatory Visit (HOSPITAL_COMMUNITY): Payer: Self-pay

## 2015-11-14 ENCOUNTER — Ambulatory Visit (HOSPITAL_COMMUNITY): Payer: Self-pay

## 2015-11-17 ENCOUNTER — Ambulatory Visit (HOSPITAL_COMMUNITY): Payer: Self-pay

## 2015-11-19 ENCOUNTER — Ambulatory Visit (HOSPITAL_COMMUNITY): Payer: Self-pay

## 2015-11-21 ENCOUNTER — Ambulatory Visit (HOSPITAL_COMMUNITY): Payer: Self-pay

## 2015-11-24 ENCOUNTER — Ambulatory Visit (HOSPITAL_COMMUNITY): Payer: Self-pay

## 2015-11-26 ENCOUNTER — Ambulatory Visit (HOSPITAL_COMMUNITY): Payer: Self-pay

## 2015-11-28 ENCOUNTER — Ambulatory Visit (HOSPITAL_COMMUNITY): Payer: Self-pay

## 2015-12-01 ENCOUNTER — Other Ambulatory Visit: Payer: Self-pay | Admitting: *Deleted

## 2015-12-01 NOTE — Telephone Encounter (Signed)
Okay to refill. Rx should be Isosorbide MN 30mg  take one-half tablet by mouth daily.

## 2015-12-01 NOTE — Telephone Encounter (Signed)
isosorbide mononitrate (IMDUR) 30 MG 24 hr tablet [id number] DISCONTINUED      Order Details    Dose: 30 mg Route: Oral Frequency: Daily   Dispense Quantity:  30 tablet Refills:  6 Fills Remaining:  6          Sig: Take 1 tablet (30 mg total) by mouth daily.         Discontinue Date:  09/04/2013 1534 Discontinue User:  Arty Baumgartner,  Discontinue Reason:  Side effect (s)   Written Date:  01/25/13 Expiration Date:  01/25/14     Start Date:  01/25/13 End Date:  09/04/13     Ordering Provider:  Liliane Shi, PA-C Authorizing Provider:  Liliane Shi, PA-C Ordering User:  Tamsen Snider          Diagnosis Association: Atrial fibrillation ([place]) (427.31); Essential hypertension, benign (401.1); Chronic diastolic heart failure (HCC) (428.32); Coronary atherosclerosis of native coronary artery (414.01); CKD (chronic kidney disease) (585.9)             Pharmacy:  [id number] - [place], [place] - [address] AT [facility] [address]      Pharmacy Comments:  --         Quantity Remaining:  180 tablet Quantity Filled:  0 tablet           isosorbide mononitrate (IMDUR) 30 MG 24 hr tablet  Medication   Date: 10/08/2015  Department: [facility]  Ordering/Authorizing: Delfina Redwood, MD      Order Providers    Prescribing Provider Encounter Provider   Delfina Redwood, MD None    Medication Detail      Disp Refills Start End     isosorbide mononitrate (IMDUR) 30 MG 24 hr tablet 15 tablet 1 10/08/2015     Sig - Route: Take 0.5 tablets (15 mg total) by mouth daily. - Oral    Class: [place], [place]

## 2015-12-02 MED ORDER — ISOSORBIDE MONONITRATE ER 30 MG PO TB24
15.0000 mg | ORAL_TABLET | Freq: Every day | ORAL | Status: DC
Start: 2015-12-02 — End: 2018-08-03

## 2015-12-26 ENCOUNTER — Other Ambulatory Visit: Payer: Self-pay | Admitting: Nephrology

## 2015-12-26 DIAGNOSIS — N184 Chronic kidney disease, stage 4 (severe): Secondary | ICD-10-CM

## 2015-12-31 ENCOUNTER — Ambulatory Visit
Admission: RE | Admit: 2015-12-31 | Discharge: 2015-12-31 | Disposition: A | Discharge status: Home / Self Care | Payer: Medicare Other | Source: Ambulatory Visit | Attending: Nephrology | Admitting: Nephrology

## 2015-12-31 DIAGNOSIS — N184 Chronic kidney disease, stage 4 (severe): Secondary | ICD-10-CM

## 2016-01-21 ENCOUNTER — Encounter: Payer: Self-pay | Admitting: Family

## 2016-01-27 ENCOUNTER — Other Ambulatory Visit: Payer: Self-pay | Admitting: Physician Assistant

## 2016-01-27 NOTE — Telephone Encounter (Signed)
Rx refill sent to pharmacy. 

## 2016-01-28 ENCOUNTER — Ambulatory Visit (INDEPENDENT_AMBULATORY_CARE_PROVIDER_SITE_OTHER): Payer: Medicare Other | Admitting: Family

## 2016-01-28 ENCOUNTER — Encounter: Payer: Self-pay | Admitting: Family

## 2016-01-28 ENCOUNTER — Ambulatory Visit (INDEPENDENT_AMBULATORY_CARE_PROVIDER_SITE_OTHER)
Admission: RE | Admit: 2016-01-28 | Discharge: 2016-01-28 | Disposition: A | Discharge status: Home / Self Care | Payer: Medicare Other | Source: Ambulatory Visit | Attending: Vascular Surgery | Admitting: Vascular Surgery

## 2016-01-28 ENCOUNTER — Ambulatory Visit (HOSPITAL_COMMUNITY)
Admission: RE | Admit: 2016-01-28 | Discharge: 2016-01-28 | Disposition: A | Discharge status: Home / Self Care | Payer: Medicare Other | Source: Ambulatory Visit | Attending: Family | Admitting: Family

## 2016-01-28 VITALS — BP 184/65 | HR 52 | Temp 97.0°F | Resp 16 | Ht 65.0 in | Wt 151.0 lb

## 2016-01-28 DIAGNOSIS — I739 Peripheral vascular disease, unspecified: Secondary | ICD-10-CM | POA: Insufficient documentation

## 2016-01-28 DIAGNOSIS — I714 Abdominal aortic aneurysm, without rupture, unspecified: Secondary | ICD-10-CM

## 2016-01-28 DIAGNOSIS — I701 Atherosclerosis of renal artery: Secondary | ICD-10-CM

## 2016-01-28 DIAGNOSIS — I708 Atherosclerosis of other arteries: Secondary | ICD-10-CM

## 2016-01-28 DIAGNOSIS — I779 Disorder of arteries and arterioles, unspecified: Secondary | ICD-10-CM | POA: Diagnosis not present

## 2016-01-28 DIAGNOSIS — I6523 Occlusion and stenosis of bilateral carotid arteries: Secondary | ICD-10-CM | POA: Insufficient documentation

## 2016-01-28 DIAGNOSIS — I771 Stricture of artery: Secondary | ICD-10-CM

## 2016-01-28 DIAGNOSIS — Z720 Tobacco use: Secondary | ICD-10-CM

## 2016-01-28 DIAGNOSIS — F17200 Nicotine dependence, unspecified, uncomplicated: Secondary | ICD-10-CM

## 2016-01-28 DIAGNOSIS — Z9889 Other specified postprocedural states: Secondary | ICD-10-CM

## 2016-01-28 NOTE — Progress Notes (Signed)
Filed Vitals:   01/28/16 1028 01/28/16 1032 01/28/16 1035  BP: 154/53 171/60 184/65  Pulse: 52 52 52  Temp: 97 F (36.1 C)    Resp: 16    Height: 5\' 5"  (1.651 m)    Weight: 151 lb (68.493 kg)    SpO2: 96%

## 2016-01-28 NOTE — Progress Notes (Signed)
Chief Complaint: Follow up Left Subclavian Artery Stenosis, s/p left CEA, AAA, and peripheral artery occlusive disease   History of Present Illness   [name] is a 65 y.o. female patient of Dr. [name] who returns for follow-up regarding the left subclavian stent placed by Dr. Trula Slade in October 2016. She is also s/p left CEA in 2009. We are also monitoring her AAA. Pt is also s/p placement of bilateral renal stents in 2004. Patient had left arm claudication symptoms as well as retrograde flow in left vertebral artery and possibly LIMA graft. Stenting was successful in left arm, claudication symptoms have been relieved. Patient had coronary angiography by Dr. Burt Knack a few days later with stenting of the coronary artery. Patient has had previous valve replacement and LIMA graft by Dr. Roxy Manns. Currently patient denies any cardiac symptoms. She does have lower extremity symptoms bilaterally to affect her after walking about 1 block. She had an MRI recently ordered by her medical doctor to rule out spinal stenosis and apparently there was no evidence of spinal stenosis.   She has a hot feeling in her mid low spine that radiates around both hips to anterior thighs and radiates down both legs; this seems to happen when she gets up from a sitting position and walks, but will rarely happen when she is sitting. This is relieved by bending at her waist.  Pt states she had an MRI of her spine but has not been evaluated by a neurosurgeon.    Pt states she did not take her blood pressure medications this AM as she was not sure that she should before the abdominal US today. Pt states she also has white coat syndrome with her blood pressure increasing. Pt denies any hx of stroke or TIA.  Dr. [name] last saw pt on 07/29/15. At that timet he left carotid endarterectomy site remained widely patent with minimal stenosis. The left subclavian artery was widely patent with equal pressures in the right and left  upper extremities. Previous exam revealed 60 mm gradient right higher than left. There was also antegrade flow in the left vertebral artery with triphasic brachial artery waveforms bilaterally.  Most recent previous ABIs were 0.8 on the right and . 5 on the left which is stable. Recent left subclavian stenting for left arm claudication and possible cardiac syndrome due to retrograde flow in LIMA graft Recent cardiac cath with PTA and stenting by Dr. 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  Patient was advised to return in 6 months to see nurse practitioner with carotid duplex exam, ABIs, duplex exam of abdominal aortic aneurysm. Her blood pressure at that time was 140/74.   Pt Diabetic: yes, A1C in January 2017 was 6.3, pt states her DM has always been in good control Pt smoker: smoker  (1 ppd since she was about 65 yrs old)  Pt meds include: Statin : yes ASA: yes Other anticoagulants/antiplatelets: Plavix   Past Medical History  Diagnosis Date  . Coronary artery disease 5/09    s/p CABG 7/12   L-LAD, S-OM, S-PDA  . PAD (peripheral artery disease) (HCC)     Severe; s/p bilateral renal artery stents, moderate in-stent restenosis  . AAA (abdominal aortic aneurysm) ([place]) 5/09    3.7x3.3 by u/s 2009  .  carotid stenosis 5/09    S/p L CEA ;  60-79% bilat ICA by preCABG dopplers 7/12  . Hypertension   . Hyperlipidemia   . COPD (chronic obstructive pulmonary disease) ([place])   . GERD (gastroesophageal reflux disease)     With hiatal hernia  . Paroxysmal atrial fibrillation (HCC)     coumadin;  echo 9/07: EF 60%, mild LVH;  s/p Cox Maze 7/12 with LAA clipping  . GIB (gastrointestinal bleeding) 9/11    S/p EGD with cautery at [facility]  .  Abnormal EKG     deep TW inversions chronic  . CKD (chronic kidney disease)   . Mitral regurgitation     3+ MR by intraoperative TEE;  s/p MV repair with Dr. Roxy Manns 7/12  . Anemia   . CHF (congestive heart failure) ([place])   . Complication of anesthesia 1966    "problem w/ether"  . Heart murmur   . CAD (coronary artery disease)     a. CABG 03/2011 LIMA to LAD, SVG to OM, SVG to PDA. b. cath 06/25/2015 DES to SVG to rPDA, patent LIMA to LAD, patent SVG to OM  . Myocardial infarction [facility]) 2012 "several"  . Chronic bronchitis ([place])     "get it pretty much q yr" (06/24/2015)  . On home oxygen therapy     "2 liters at night; negative for sleep apnea"  . Type II diabetes mellitus ([place])   . History of blood transfusion "a few times"    "all related to anemia" (06/24/2015)  . History of hiatal hernia   . Chronic back pain     "all over" (06/24/2015)  . Depression   . Subclavian artery stenosis, left     stented by Dr. Trula Slade on 10/18 to help flow of her LIMA to LAD    Social History Social History  Substance Use Topics  . Smoking status: Current Every Day Smoker -- 1.00 packs/day for 50 years    Types: Cigarettes  . Smokeless tobacco: Never Used  . Alcohol Use: 0.0 oz/week    0 Standard drinks or equivalent per week    Family History Family History  Problem Relation Age of Onset  . Emphysema Mother   . Heart disease Mother     before age 73  . Hypertension Mother   . Hyperlipidemia Mother   . Heart attack Mother   . Heart attack Father 35  . Emphysema Father   . Heart disease Father     before age 60  . Hyperlipidemia Father   . Hypertension Father   . Peripheral vascular disease Father   . Heart disease Other     Vascular disease in grandparents, uncles and dad  . Diabetes Brother   . Heart disease Brother     before age 38  . Hyperlipidemia Brother   . Hypertension Brother   . Heart attack Brother     CABG  . AAA (abdominal aortic aneurysm) Mother     rupture     Surgical History Past Surgical History  Procedure Laterality Date  . Carotid endarterectomy Left   . Laceration repair Right 1990's    WRIST  . Right femoral-popliteal bypass    . Coronary artery bypass graft  03/05/2011    CABG X3 (LIMA to LAD, SVG to OM, SVG to PDA, EVH via left thigh  . Mitral valve repair  03/05/2011    92mm Memo 3D ring annuloplasty for ischemic MR  . Maze  03/05/2011  complete biatrial lesion set with clipping of LA appendage  . Bowel resection  2013  . Renal angiogram Bilateral 09/11/2013    Procedure: RENAL ANGIOGRAM;  Surgeon: Serafina Mitchell, MD;  Location: [facility] CATH LAB;  Service: Cardiovascular;  Laterality: Bilateral;  . Peripheral vascular catheterization N/A 06/24/2015    Procedure: Aortic Arch Angiography;  Surgeon: Serafina Mitchell, MD;  Location: [place] CV LAB;  Service: Cardiovascular;  Laterality: N/A;  . Peripheral vascular catheterization  06/24/2015    Procedure: Peripheral Vascular Intervention;  Surgeon: Serafina Mitchell, MD;  Location: [place] CV LAB;  Service: Cardiovascular;;  . Peripheral vascular catheterization N/A 06/24/2015    Procedure: Abdominal Aortogram;  Surgeon: Serafina Mitchell, MD;  Location: [place] CV LAB;  Service: Cardiovascular;  Laterality: N/A;  . Cardiac catheterization N/A 06/24/2015    Procedure: Left Heart Cath and Cors/Grafts Angiography;  Surgeon: Leonie Man, MD;  Location: [place] CV LAB;  Service: Cardiovascular;  Laterality: N/A;  . Appendectomy  Aug. 11, 2016    Ruptured  . Abdominal hysterectomy  1990's  . Coronary angioplasty    . Cardiac catheterization N/A 06/25/2015    Procedure: Coronary Stent Intervention;  Surgeon: Sherren Mocha, MD;  Location: [place] CV LAB;  Service: Cardiovascular;  Laterality: N/A;  . Coronary stent placement      Allergies  Allergen Reactions  . Imdur [Isosorbide Dinitrate] Other (See Comments)    "felt like my insides were going to explode"  .  Codeine Itching  . Contrast Media [Iodinated Diagnostic Agents] Itching and Other (See Comments)    Itching of feet  . Ioxaglate Itching and Other (See Comments)    Itching of feet  . Metrizamide Itching and Other (See Comments)    Itching of feet  . Percocet [Oxycodone-Acetaminophen] Itching and Other (See Comments)    Tolerates Hydrocodone    Current Outpatient Prescriptions  Medication Sig Dispense Refill  . albuterol (PROVENTIL HFA;VENTOLIN HFA) 108 (90 Base) MCG/ACT inhaler Inhale 2 puffs into the lungs every 4 (four) hours as needed for wheezing or shortness of breath. ProAir 1 each 0  . ALPRAZolam (XANAX) 0.5 MG tablet Take 0.5 mg by mouth 2 (two) times daily as needed. (anxiety)    . amLODipine (NORVASC) 10 MG tablet Take 10 mg by mouth daily.      Marland Kitchen aspirin 81 MG chewable tablet Chew 1 tablet (81 mg total) by mouth daily.    . carvedilol (COREG) 25 MG tablet TAKE 1 TABLET BY MOUTH 2 TIMES A DAY WITH A MEAL 60 tablet 9  . cloNIDine (CATAPRES) 0.2 MG tablet Take 1 tablet (0.2 mg total) by mouth 3 (three) times daily. 90 tablet 11  . clopidogrel (PLAVIX) 75 MG tablet Take 1 tablet (75 mg total) by mouth daily with breakfast. 90 tablet 3  . famotidine (PEPCID) 20 MG tablet TAKE 1 TABLET (20 MG TOTAL) BY MOUTH DAILY BEFORE SUPPER. 30 tablet 2  . fenofibrate 160 MG tablet Take 1 tablet (160 mg total) by mouth daily. 30 tablet 1  . ferrous gluconate (FERGON) 325 MG tablet Take 325 mg by mouth 2 (two) times daily.     . furosemide (LASIX) 20 MG tablet Take 20 mg by mouth daily. (May take one (1) additional tablet by mouth daily as needed for weight gain.)  6  . glipiZIDE (GLUCOTROL XL) 5 MG 24 hr tablet Take 5 mg by mouth at bedtime.     . hydrALAZINE (APRESOLINE) 100 MG  tablet TAKE 1 TABLET BY MOUTH THREE TIMES DAILY (Patient taking differently: TAKE 100 MG BY MOUTH THREE TIMES DAILY) 90 tablet 0  . HYDROcodone-acetaminophen (NORCO/VICODIN) 5-325 MG per tablet Take 1 tablet by mouth 3  (three) times daily as needed for moderate pain.     . isosorbide mononitrate (IMDUR) 30 MG 24 hr tablet Take 0.5 tablets (15 mg total) by mouth daily. 45 tablet 3  . omega-3 acid ethyl esters (LOVAZA) 1 g capsule Take 2 capsules (2 g total) by mouth 2 (two) times daily. 30 capsule 1  . pantoprazole (PROTONIX) 40 MG tablet Take 1 tablet (40 mg total) by mouth daily. 30 tablet 11  . pravastatin (PRAVACHOL) 80 MG tablet Take 80 mg by mouth daily.  3  . sertraline (ZOLOFT) 50 MG tablet Take 100 mg by mouth daily. Per patient she is now taking 100 mg daily.    Marland Kitchen zolpidem (AMBIEN) 10 MG tablet Take 10 mg by mouth at bedtime.     No current facility-administered medications for this visit.    Review of Systems : See HPI for pertinent positives and negatives.  Physical Examination  Filed Vitals:   01/28/16 1028 01/28/16 1032 01/28/16 1035  BP: 154/53 171/60 184/65  Pulse: 52 52 52  Temp: 97 F (36.1 C)    Resp: 16    Height: 5\' 5"  (1.651 m)    Weight: 151 lb (68.493 kg)    SpO2: 96%     Body mass index is 25.13 kg/(m^2).  General: WDWN female in NAD GAIT: normal Eyes: PERRLA Pulmonary:  Non-labored respirations, CTAB but with limited air movement.  Cardiac: regular rhythm, + murmur.  VASCULAR EXAM Carotid Bruits Right Left   Muffled transmitted cardiac murmur Transmitted cardiac murmur    Aorta is moderately palpable. Radial pulses are 2+ palpable and equal.                                                                                                                            LE Pulses Right Left       FEMORAL  2+ palpable  faintly palpable        POPLITEAL  not palpable   not palpable       POSTERIOR TIBIAL  not palpable   not palpable        DORSALIS PEDIS      ANTERIOR TIBIAL not palpable  not palpable     Gastrointestinal: soft, nontender, BS WNL, no r/g, no palpable masses.  Musculoskeletal: no muscle atrophy/wasting. M/S 5/5 in upper extremities, 3/5 in  lower extremities, extremities without ischemic changes.  Neurologic: A&O X 3; Appropriate Affect, Speech is normal CN 2-12 intact, pain and light touch intact in extremities, Motor exam as listed above.   Non-Invasive Vascular Imaging 01/28/2016:  CAROTID DUPLEX: Right ICA: 40 - 59 % stenosis. Left ICA: CEA site <40% stenosis. Left subclavian artery stent with no significant stenosis No significant change from previous exams  AAA Duplex:  Decreased visualization of the abdominal vasculature due to bowel gas and body habitus. Patent bilateral renal artery stents with no significant increase in the bilateral proximal renal artery velocities based on limited evaluation.  Aneurysmal dilation of the distal abdominal aorta with a maximum diameter of 4.4 cm; previous maximum diameter was 4.3 cm on 12/10/14.  ABI: Right: 0.76 (0.80, 09/28/12), biphasic waveforms, TBI: 0.48 Left: 0.52 (0.55), monophasic waveforms, TBI: 0.23   Assessment: [name] is a 65 y.o. female who is s/p left subclavian stent placed by Dr. Trula Slade in October 2016. She is also s/p left CEA in 2009. We are also monitoring her AAA. Pt is also s/p placement of bilateral renal stents in 2004.  She has no hx of stroke or TIA. She has pain in both legs with walking and sometimes sitting that is relieved by bending at her waist;  this is not c/w claudication sx's. She reports that years ago she was diagnosed as having issues with her lumbar spine.  Carotid duplex today suggests 40 - 59 % right ICA stenosis. Left ICA: CEA site <40% stenosis. Left subclavian artery stent with no significant stenosis No significant change from previous exams.  AAA duplex today suggests decreased visualization of the abdominal vasculature due to bowel gas and body habitus. Patent bilateral renal artery stents with no significant increase in the bilateral proximal renal artery velocities based on limited evaluation.  Aneurysmal dilation of  the distal abdominal aorta with a maximum diameter of 4.4 cm; previous maximum diameter was 4.3 cm on 12/10/14.  Her ABI's remain stable in the last 3 years.  I advised pt to see her PCP and/or cardiologist re her elevated blood pressure. Pt is taking four medications that have antihypertensive effects in addition to a diuretic.   She also sees a nephrologist for stage 4 CKD.  Her creatinine was 2.17 on 10/08/15 (reveiw of records).   Her atherosclerotic risk factors include controlled DM, 45+ years smoking history (currently a ppd), CAD, and CKD. Her risk factors for abdominal aortic aneurysmal growth include smoking and uncontrolled hypertension.  She has multiple significant co morbidities.  She take and ASA, Plavix, and a statin.  Plan: The patient was counseled re smoking cessation and given several free resources re smoking cessation.   Follow-up in 6 months with AAA Duplex and ABI's, carotid duplex in a year  I discussed in depth with the patient the nature of atherosclerosis, and emphasized the importance of maximal medical management including strict control of blood pressure, blood glucose, and lipid levels, obtaining regular exercise, and cessation of smoking.  The patient is aware that without maximal medical management the underlying atherosclerotic disease process will progress, limiting the benefit of any interventions. The patient was given information about stroke prevention and what symptoms should prompt the patient to seek immediate medical care. Thank you for allowing [place] to participate in this patient's care.  Clemon Chambers, RN, MSN, FNP-C [facility] Office: [phone]  Clinic Physician: Scot Dock  01/28/2016 10:37 AM

## 2016-01-28 NOTE — Patient Instructions (Signed)
Abdominal Aortic Aneurysm An aneurysm is a weakened or damaged part of an artery wall that bulges from the normal force of blood pumping through the body. An abdominal aortic aneurysm is an aneurysm that occurs in the lower part of the aorta, the main artery of the body.  The major concern with an abdominal aortic aneurysm is that it can enlarge and burst (rupture) or blood can flow between the layers of the wall of the aorta through a tear (aorticdissection). Both of these conditions can cause bleeding inside the body and can be life threatening unless diagnosed and treated promptly. CAUSES  The exact cause of an abdominal aortic aneurysm is unknown. Some contributing factors are:   A hardening of the arteries caused by the buildup of fat and other substances in the lining of a blood vessel (arteriosclerosis).  Inflammation of the walls of an artery (arteritis).   Connective tissue diseases, such as Marfan syndrome.   Abdominal trauma.   An infection, such as syphilis or staphylococcus, in the wall of the aorta (infectious aortitis) caused by bacteria. RISK FACTORS  Risk factors that contribute to an abdominal aortic aneurysm may include:  Age older than 60 years.   High blood pressure (hypertension).  Female gender.  Ethnicity (white race).  Obesity.  Family history of aneurysm (first degree relatives only).  Tobacco use. PREVENTION  The following healthy lifestyle habits may help decrease your risk of abdominal aortic aneurysm:  Quitting smoking. Smoking can raise your blood pressure and cause arteriosclerosis.  Limiting or avoiding alcohol.  Keeping your blood pressure, blood sugar level, and cholesterol levels within normal limits.  Decreasing your salt intake. In somepeople, too much salt can raise blood pressure and increase your risk of abdominal aortic aneurysm.  Eating a diet low in saturated fats and cholesterol.  Increasing your fiber intake by including  whole grains, vegetables, and fruits in your diet. Eating these foods may help lower blood pressure.  Maintaining a healthy weight.  Staying physically active and exercising regularly. SYMPTOMS  The symptoms of abdominal aortic aneurysm may vary depending on the size and rate of growth of the aneurysm.Most grow slowly and do not have any symptoms. When symptoms do occur, they may include:  Pain (abdomen, side, lower back, or groin). The pain may vary in intensity. A sudden onset of severe pain may indicate that the aneurysm has ruptured.  Feeling full after eating only small amounts of food.  Nausea or vomiting or both.  Feeling a pulsating lump in the abdomen.  Feeling faint or passing out. DIAGNOSIS  Since most unruptured abdominal aortic aneurysms have no symptoms, they are often discovered during diagnostic exams for other conditions. An aneurysm may be found during the following procedures:  Ultrasonography (A one-time screening for abdominal aortic aneurysm by ultrasonography is also recommended for all men aged 65-75 years who have ever smoked).  X-ray exams.  A computed tomography (CT).  Magnetic resonance imaging (MRI).  Angiography or arteriography. TREATMENT  Treatment of an abdominal aortic aneurysm depends on the size of your aneurysm, your age, and risk factors for rupture. Medication to control blood pressure and pain may be used to manage aneurysms smaller than 6 cm. Regular monitoring for enlargement may be recommended by your caregiver if:  The aneurysm is 3-4 cm in size (an annual ultrasonography may be recommended).  The aneurysm is 4-4.5 cm in size (an ultrasonography every 6 months may be recommended).  The aneurysm is larger than 4.5 cm in   size (your caregiver may ask that you be examined by a vascular surgeon). If your aneurysm is larger than 6 cm, surgical repair may be recommended. There are two main methods for repair of an aneurysm:   Endovascular  repair (a minimally invasive surgery). This is done most often.  Open repair. This method is used if an endovascular repair is not possible.   This information is not intended to replace advice given to you by your health care provider. Make sure you discuss any questions you have with your health care provider.   Document Released: 06/02/2005 Document Revised: 12/18/2012 Document Reviewed: 09/22/2012 Elsevier Interactive Patient Education 2016 [employer]    Peripheral Vascular Disease Peripheral vascular disease (PVD) is a disease of the blood vessels that are not part of your heart and brain. A simple term for PVD is poor circulation. In most cases, PVD narrows the blood vessels that carry blood from your heart to the rest of your body. This can result in a decreased supply of blood to your arms, legs, and internal organs, like your stomach or kidneys. However, it most often affects a person's lower legs and feet. There are two types of PVD.  Organic PVD. This is the more common type. It is caused by damage to the structure of blood vessels.  Functional PVD. This is caused by conditions that make blood vessels contract and tighten (spasm). Without treatment, PVD tends to get worse over time. PVD can also lead to acute ischemic limb. This is when an arm or limb suddenly has trouble getting enough blood. This is a medical emergency. CAUSES Each type of PVD has many different causes. The most common cause of PVD is buildup of a fatty material (plaque) inside of your arteries (atherosclerosis). Small amounts of plaque can break off from the walls of the blood vessels and become lodged in a smaller artery. This blocks blood flow and can cause acute ischemic limb. Other common causes of PVD include:  Blood clots that form inside of blood vessels.  Injuries to blood vessels.  Diseases that cause inflammation of blood vessels or cause blood vessel spasms.  Health behaviors and health  history that increase your risk of developing PVD. RISK FACTORS  You may have a greater risk of PVD if you:  Have a family history of PVD.  Have certain medical conditions, including:  High cholesterol.  Diabetes.  High blood pressure (hypertension).  Coronary heart disease.  Past problems with blood clots.  Past injury, such as burns or a broken bone. These may have damaged blood vessels in your limbs.  Buerger disease. This is caused by inflamed blood vessels in your hands and feet.  Some forms of arthritis.  Rare birth defects that affect the arteries in your legs.  Use tobacco.  Do not get enough exercise.  Are obese.  Are age 50 or older. SIGNS AND SYMPTOMS  PVD may cause many different symptoms. Your symptoms depend on what part of your body is not getting enough blood. Some common signs and symptoms include:  Cramps in your lower legs. This may be a symptom of poor leg circulation (claudication).  Pain and weakness in your legs while you are physically active that goes away when you rest (intermittent claudication).  Leg pain when at rest.  Leg numbness, tingling, or weakness.  Coldness in a leg or foot, especially when compared with the other leg.  Skin or hair changes. These can include:  Hair loss.  Shiny   skin.  Pale or bluish skin.  Thick toenails.  Inability to get or maintain an erection (erectile dysfunction). People with PVD are more prone to developing ulcers and sores on their toes, feet, or legs. These may take longer than normal to heal. DIAGNOSIS Your health care provider may diagnose PVD from your signs and symptoms. The health care provider will also do a physical exam. You may have tests to find out what is causing your PVD and determine its severity. Tests may include:  Blood pressure recordings from your arms and legs and measurements of the strength of your pulses (pulse volume recordings).  Imaging studies using sound waves to  take pictures of the blood flow through your blood vessels (Doppler ultrasound).  Injecting a dye into your blood vessels before having imaging studies using:  X-rays (angiogram or arteriogram).  Computer-generated X-rays (CT angiogram).  A powerful electromagnetic field and a computer (magnetic resonance angiogram or MRA). TREATMENT Treatment for PVD depends on the cause of your condition and the severity of your symptoms. It also depends on your age. Underlying causes need to be treated and controlled. These include long-lasting (chronic) conditions, such as diabetes, high cholesterol, and high blood pressure. You may need to first try making lifestyle changes and taking medicines. Surgery may be needed if these do not work. Lifestyle changes may include:  Quitting smoking.  Exercising regularly.  Following a low-fat, low-cholesterol diet. Medicines may include:  Blood thinners to prevent blood clots.  Medicines to improve blood flow.  Medicines to improve your blood cholesterol levels. Surgical procedures may include:  A procedure that uses an inflated balloon to open a blocked artery and improve blood flow (angioplasty).  A procedure to put in a tube (stent) to keep a blocked artery open (stent implant).  Surgery to reroute blood flow around a blocked artery (peripheral bypass surgery).  Surgery to remove dead tissue from an infected wound on the affected limb.  Amputation. This is surgical removal of the affected limb. This may be necessary in cases of acute ischemic limb that are not improved through medical or surgical treatments. HOME CARE INSTRUCTIONS  Take medicines only as directed by your health care provider.  Do not use any tobacco products, including cigarettes, chewing tobacco, or electronic cigarettes. If you need help quitting, ask your health care provider.  Lose weight if you are overweight, and maintain a healthy weight as directed by your health care  provider.  Eat a diet that is low in fat and cholesterol. If you need help, ask your health care provider.  Exercise regularly. Ask your health care provider to suggest some good activities for you.  Use compression stockings or other mechanical devices as directed by your health care provider.  Take good care of your feet.  Wear comfortable shoes that fit well.  Check your feet often for any cuts or sores. SEEK MEDICAL CARE IF:  You have cramps in your legs while walking.  You have leg pain when you are at rest.  You have coldness in a leg or foot.  Your skin changes.  You have erectile dysfunction.  You have cuts or sores on your feet that are not healing. SEEK IMMEDIATE MEDICAL CARE IF:  Your arm or leg turns cold and blue.  Your arms or legs become red, warm, swollen, painful, or numb.  You have chest pain or trouble breathing.  You suddenly have weakness in your face, arm, or leg.  You become very confused or   lose the ability to speak.  You suddenly have a very bad headache or lose your vision.   This information is not intended to replace advice given to you by your health care provider. Make sure you discuss any questions you have with your health care provider.   Document Released: 09/30/2004 Document Revised: 09/13/2014 Document Reviewed: 01/31/2014 Elsevier Interactive Patient Education 2016 [employer]    Steps to Quit Smoking  Smoking tobacco can be harmful to your health and can affect almost every organ in your body. Smoking puts you, and those around you, at risk for developing many serious chronic diseases. Quitting smoking is difficult, but it is one of the best things that you can do for your health. It is never too late to quit. WHAT ARE THE BENEFITS OF QUITTING SMOKING? When you quit smoking, you lower your risk of developing serious diseases and conditions, such as:  Lung cancer or lung disease, such as COPD.  Heart  disease.  Stroke.  Heart attack.  Infertility.  Osteoporosis and bone fractures. Additionally, symptoms such as coughing, wheezing, and shortness of breath may get better when you quit. You may also find that you get sick less often because your body is stronger at fighting off colds and infections. If you are pregnant, quitting smoking can help to reduce your chances of having a baby of low birth weight. HOW DO I GET READY TO QUIT? When you decide to quit smoking, create a plan to make sure that you are successful. Before you quit:  Pick a date to quit. Set a date within the next two weeks to give you time to prepare.  Write down the reasons why you are quitting. Keep this list in places where you will see it often, such as on your bathroom mirror or in your car or wallet.  Identify the people, places, things, and activities that make you want to smoke (triggers) and avoid them. Make sure to take these actions:  Throw away all cigarettes at home, at work, and in your car.  Throw away smoking accessories, such as ashtrays and lighters.  Clean your car and make sure to empty the ashtray.  Clean your home, including curtains and carpets.  Tell your family, friends, and coworkers that you are quitting. Support from your loved ones can make quitting easier.  Talk with your health care provider about your options for quitting smoking.  Find out what treatment options are covered by your health insurance. WHAT STRATEGIES CAN I USE TO QUIT SMOKING?  Talk with your healthcare provider about different strategies to quit smoking. Some strategies include:  Quitting smoking altogether instead of gradually lessening how much you smoke over a period of time. Research shows that quitting "cold turkey" is more successful than gradually quitting.  Attending in-person counseling to help you build problem-solving skills. You are more likely to have success in quitting if you attend several  counseling sessions. Even short sessions of 10 minutes can be effective.  Finding resources and support systems that can help you to quit smoking and remain smoke-free after you quit. These resources are most helpful when you use them often. They can include:  Online chats with a counselor.  Telephone quitlines.  Printed self-help materials.  Support groups or group counseling.  Text messaging programs.  Mobile phone applications.  Taking medicines to help you quit smoking. (If you are pregnant or breastfeeding, talk with your health care provider first.) Some medicines contain nicotine and some do   not. Both types of medicines help with cravings, but the medicines that include nicotine help to relieve withdrawal symptoms. Your health care provider may recommend:  Nicotine patches, gum, or lozenges.  Nicotine inhalers or sprays.  Non-nicotine medicine that is taken by mouth. Talk with your health care provider about combining strategies, such as taking medicines while you are also receiving in-person counseling. Using these two strategies together makes you more likely to succeed in quitting than if you used either strategy on its own. If you are pregnant or breastfeeding, talk with your health care provider about finding counseling or other support strategies to quit smoking. Do not take medicine to help you quit smoking unless told to do so by your health care provider. WHAT THINGS CAN I DO TO MAKE IT EASIER TO QUIT? Quitting smoking might feel overwhelming at first, but there is a lot that you can do to make it easier. Take these important actions:  Reach out to your family and friends and ask that they support and encourage you during this time. Call telephone quitlines, reach out to support groups, or work with a counselor for support.  Ask people who smoke to avoid smoking around you.  Avoid places that trigger you to smoke, such as bars, parties, or smoke-break areas at  work.  Spend time around people who do not smoke.  Lessen stress in your life, because stress can be a smoking trigger for some people. To lessen stress, try:  Exercising regularly.  Deep-breathing exercises.  Yoga.  Meditating.  Performing a body scan. This involves closing your eyes, scanning your body from head to toe, and noticing which parts of your body are particularly tense. Purposefully relax the muscles in those areas.  Download or purchase mobile phone or tablet apps (applications) that can help you stick to your quit plan by providing reminders, tips, and encouragement. There are many free apps, such as QuitGuide from the CDC (Centers for Disease Control and Prevention). You can find other support for quitting smoking (smoking cessation) through smokefree.gov and other websites. HOW WILL I FEEL WHEN I QUIT SMOKING? Within the first 24 hours of quitting smoking, you may start to feel some withdrawal symptoms. These symptoms are usually most noticeable 2-3 days after quitting, but they usually do not last beyond 2-3 weeks. Changes or symptoms that you might experience include:  Mood swings.  Restlessness, anxiety, or irritation.  Difficulty concentrating.  Dizziness.  Strong cravings for sugary foods in addition to nicotine.  Mild weight gain.  Constipation.  Nausea.  Coughing or a sore throat.  Changes in how your medicines work in your body.  A depressed mood.  Difficulty sleeping (insomnia). After the first 2-3 weeks of quitting, you may start to notice more positive results, such as:  Improved sense of smell and taste.  Decreased coughing and sore throat.  Slower heart rate.  Lower blood pressure.  Clearer skin.  The ability to breathe more easily.  Fewer sick days. Quitting smoking is very challenging for most people. Do not get discouraged if you are not successful the first time. Some people need to make many attempts to quit before they  achieve long-term success. Do your best to stick to your quit plan, and talk with your health care provider if you have any questions or concerns.   This information is not intended to replace advice given to you by your health care provider. Make sure you discuss any questions you have with your health care   provider.   Document Released: 08/17/2001 Document Revised: 01/07/2015 Document Reviewed: 01/07/2015 Elsevier Interactive Patient Education 2016 [employer]  

## 2016-04-02 NOTE — Addendum Note (Signed)
Addended by: Thresa Ross C on: 04/02/2016 12:03 PM   Modules accepted: Orders

## 2016-04-02 NOTE — Addendum Note (Signed)
Addended by: Reola Calkins on: 04/02/2016 01:43 PM   Modules accepted: Orders

## 2016-04-13 ENCOUNTER — Other Ambulatory Visit: Payer: Self-pay | Admitting: Cardiovascular Disease

## 2016-09-16 ENCOUNTER — Encounter: Payer: Self-pay | Admitting: Family

## 2016-09-20 ENCOUNTER — Ambulatory Visit (HOSPITAL_COMMUNITY): Payer: Medicare Other | Attending: Family

## 2016-09-20 ENCOUNTER — Ambulatory Visit (HOSPITAL_COMMUNITY): Payer: Medicare Other

## 2016-09-20 ENCOUNTER — Ambulatory Visit: Payer: Medicare Other | Admitting: Family

## 2016-12-07 ENCOUNTER — Other Ambulatory Visit: Payer: Self-pay | Admitting: *Deleted

## 2016-12-07 MED ORDER — FUROSEMIDE 20 MG PO TABS: ORAL_TABLET | ORAL | 0 refills | Status: DC

## 2016-12-07 MED ORDER — FUROSEMIDE 20 MG PO TABS: 20.0000 mg | ORAL_TABLET | Freq: Every day | ORAL | 0 refills | Status: DC

## 2017-01-26 ENCOUNTER — Encounter: Payer: Self-pay | Admitting: Family

## 2017-02-08 ENCOUNTER — Ambulatory Visit (HOSPITAL_COMMUNITY): Payer: Medicare Other

## 2017-02-08 ENCOUNTER — Ambulatory Visit: Payer: Self-pay | Admitting: Family

## 2017-02-17 ENCOUNTER — Other Ambulatory Visit: Payer: Self-pay | Admitting: Family Medicine

## 2017-02-21 NOTE — Progress Notes (Signed)
Cardiology Office Note    Date:  02/22/2017   ID:  [name], DOB 12-28-1950, MRN [id number]  PCP:  Bernerd Limbo, MD  Cardiologist:  Dr. Burt Knack  Chief Complaint: Medication management   History of Present Illness:   [name] is a 66 y.o. female CAD s/p CABG, mitral valve disorder s/p repair with maze procedure, subclavian steal syndrome status post stenting, peripheral artery disease followed by vascular surgery, COPD, DM, HTN, CKD, stage IV, ongoing tobacco smoking and HLD here for medication management.  Hx of CABG and cryo-Cox Maze repair with maze surgery and left atrial appendage clipping.  In October 2016 she underwent left subclavian stenting to improve inflow into her LIMA graft. She also underwent PCI of the saphenous vein graft to right PDA.  She's had multiple renal artery stenting procedures, carotid endarterectomy, failed lower extremity bypass, surveillance for abdominal aortic aneurysm, and left subclavian stenting.   Last seen by Dr. Burt Knack 10/2015. She continues to have exertional dyspnea. His echocardiogram February 2017 showed LV function of 55-60%, apical myocardium aneurysm, normal functioning valve.   Here for medication refill of Lasix. She takes Lasix 20 mg daily with when necessary for edema. She requires additional Lasix 3-5 times per month (related to excess salt intake). Recent lab work by PCP showed stable serum creatinine of 2.5. She hasn't seen Dr. Posey Pronto (her nephrologist) recently. She continues to smoke approximately 3/4 pack of cigarettes every day. She has a chronic dyspnea. Intermittent palpitation. This occurs once every few months, last for few seconds with self resolution. He denies any chest pain, orthopnea, PND, dizziness or syncope.  Past Medical History:  Diagnosis Date  . AAA (abdominal aortic aneurysm) ([place]) 5/09   3.7x3.3 by u/s 2009  . Abnormal EKG    deep TW inversions chronic  . Anemia   . CAD (coronary artery disease)    a.  CABG 03/2011 LIMA to LAD, SVG to OM, SVG to PDA. b. cath 06/25/2015 DES to SVG to rPDA, patent LIMA to LAD, patent SVG to OM  . carotid stenosis 5/09   S/p L CEA ;  60-79% bilat ICA by preCABG dopplers 7/12  . CHF (congestive heart failure) ([place])   . Chronic back pain    "all over" (06/24/2015)  . Chronic bronchitis ([place])    "get it pretty much q yr" (06/24/2015)  . CKD (chronic kidney disease)   . Complication of anesthesia 1966   "problem w/ether"  . COPD (chronic obstructive pulmonary disease) ([place])   . Coronary artery disease 5/09   s/p CABG 7/12   L-LAD, S-OM, S-PDA  . Depression   . GERD (gastroesophageal reflux disease)    With hiatal hernia  . GIB (gastrointestinal bleeding) 9/11   S/p EGD with cautery at [facility]  . Heart murmur   . History of blood transfusion "a few times"   "all related to anemia" (06/24/2015)  . History of hiatal hernia   . Hyperlipidemia   . Hypertension   . Mitral regurgitation    3+ MR by intraoperative TEE;  s/p MV repair with Dr. Roxy Manns 7/12  . Myocardial infarction [facility]) 2012 "several"  . On home oxygen therapy    "2 liters at night; negative for sleep apnea"  . PAD (peripheral artery disease) ([facility])    Severe; s/p bilateral renal artery stents, moderate in-stent restenosis  . Paroxysmal atrial fibrillation ([facility])    coumadin;  echo 9/07: EF 60%, mild LVH;  s/p Cox Maze 7/12  with LAA clipping  . Subclavian artery stenosis, left ([facility])    stented by Dr. Trula Slade on 10/18 to help flow of her LIMA to LAD  . Type II diabetes mellitus ([place])     Past Surgical History:  Procedure Laterality Date  . ABDOMINAL HYSTERECTOMY  1990's  . APPENDECTOMY  Aug. 11, 2016   Ruptured  . BOWEL RESECTION  2013  . CARDIAC CATHETERIZATION N/A 06/24/2015   Procedure: Left Heart Cath and Cors/Grafts Angiography;  Surgeon: Leonie Man, MD;  Location: [facility];  Service: Cardiovascular;  Laterality: N/A;  . CARDIAC CATHETERIZATION N/A 06/25/2015   Procedure:  Coronary Stent Intervention;  Surgeon: Sherren Mocha, MD;  Location: [facility];  Service: Cardiovascular;  Laterality: N/A;  . CAROTID ENDARTERECTOMY Left   . CORONARY ANGIOPLASTY    . CORONARY ARTERY BYPASS GRAFT  03/05/2011   CABG X3 (LIMA to LAD, SVG to OM, SVG to PDA, EVH via left thigh  . CORONARY STENT PLACEMENT    . LACERATION REPAIR Right 1990's   WRIST  . MAZE  03/05/2011   complete biatrial lesion set with clipping of LA appendage  . MITRAL VALVE REPAIR  03/05/2011   66mm Memo 3D ring annuloplasty for ischemic MR  . PERIPHERAL VASCULAR CATHETERIZATION N/A 06/24/2015   Procedure: Aortic Arch Angiography;  Surgeon: Serafina Mitchell, MD;  Location: [facility];  Service: Cardiovascular;  Laterality: N/A;  . PERIPHERAL VASCULAR CATHETERIZATION  06/24/2015   Procedure: Peripheral Vascular Intervention;  Surgeon: Serafina Mitchell, MD;  Location: [facility];  Service: Cardiovascular;;  . PERIPHERAL VASCULAR CATHETERIZATION N/A 06/24/2015   Procedure: Abdominal Aortogram;  Surgeon: Serafina Mitchell, MD;  Location: [facility];  Service: Cardiovascular;  Laterality: N/A;  . RENAL ANGIOGRAM Bilateral 09/11/2013   Procedure: RENAL ANGIOGRAM;  Surgeon: Serafina Mitchell, MD;  Location: [facility] C[facility] LAB;  Service: Cardiovascular;  Laterality: Bilateral;  . RIGHT FEMORAL-POPLITEAL BYPASS      Current Medications: Prior to Admission medications   Medication Sig Start Date End Date Taking? Authorizing Provider  albuterol (PROVENTIL HFA;VENTOLIN HFA) 108 (90 Base) MCG/ACT inhaler Inhale 2 puffs into the lungs every 4 (four) hours as needed for wheezing or shortness of breath. ProAir 10/08/15   Delfina Redwood, MD  ALPRAZolam Duanne Moron) 0.5 MG tablet Take 0.5 mg by mouth 2 (two) times daily as needed. (anxiety) 10/16/15   [provider]  amLODipine (NORVASC) 10 MG tablet Take 10 mg by mouth daily.      [provider]  aspirin 81 MG chewable tablet Chew 1  tablet (81 mg total) by mouth daily. 06/26/15   Almyra Deforest, PA  carvedilol (COREG) 25 MG tablet TAKE 1 TABLET BY MOUTH 2 TIMES A DAY WITH A MEAL 11/04/15   Burtis Junes, NP  cloNIDine (CATAPRES) 0.2 MG tablet Take 1 tablet (0.2 mg total) by mouth 3 (three) times daily. 07/10/15   Richardson Dopp T, PA-C  clopidogrel (PLAVIX) 75 MG tablet Take 1 tablet (75 mg total) by mouth daily with breakfast. 06/26/15   Almyra Deforest, PA  famotidine (PEPCID) 20 MG tablet TAKE 1 TABLET (20 MG TOTAL) BY MOUTH DAILY BEFORE SUPPER. 01/27/16   Sherren Mocha, MD  fenofibrate 160 MG tablet Take 1 tablet (160 mg total) by mouth daily. 10/08/15   Delfina Redwood, MD  ferrous gluconate (FERGON) 325 MG tablet Take 325 mg by mouth 2 (two) times daily.     [provider]  furosemide (LASIX) 20 MG tablet Take 1 tablet (20 mg total) by mouth daily. MAY TAKE 1 ADDITIONAL TABLET AS NEEDED FOR WEIGHT GAIN. 12/07/16   Sherren Mocha, MD  glipiZIDE (GLUCOTROL XL) 5 MG 24 hr tablet Take 5 mg by mouth at bedtime.     [provider]  hydrALAZINE (APRESOLINE) 100 MG tablet TAKE 1 TABLET BY MOUTH THREE TIMES DAILY Patient taking differently: TAKE 100 MG BY MOUTH THREE TIMES DAILY 12/25/13   Sherren Mocha, MD  HYDROcodone-acetaminophen (NORCO/VICODIN) 5-325 MG per tablet Take 1 tablet by mouth 3 (three) times daily as needed for moderate pain.     [provider]  isosorbide mononitrate (IMDUR) 30 MG 24 hr tablet Take 0.5 tablets (15 mg total) by mouth daily. 12/02/15   Sherren Mocha, MD  omega-3 acid ethyl esters (LOVAZA) 1 g capsule Take 2 capsules (2 g total) by mouth 2 (two) times daily. 10/08/15   Delfina Redwood, MD  pantoprazole (PROTONIX) 40 MG tablet Take 1 tablet (40 mg total) by mouth daily. 06/26/15   Almyra Deforest, PA  pravastatin (PRAVACHOL) 80 MG tablet Take 80 mg by mouth daily. 10/06/15   [provider]  sertraline (ZOLOFT) 50 MG tablet Take 100 mg by mouth daily. Per patient she is now  taking 100 mg daily. 10/10/15 10/09/16  [provider]  zolpidem (AMBIEN) 10 MG tablet Take 10 mg by mouth at bedtime. 05/07/15 05/06/16  [provider]    Allergies:   Imdur [isosorbide dinitrate]; Codeine; Contrast media [iodinated diagnostic agents]; Ioxaglate; Metrizamide; and Percocet [oxycodone-acetaminophen]   Social History   Social History  . Marital status: Widowed    Spouse name: N/A  . Number of children: N/A  . Years of education: N/A   Occupational History  . Cashier in past Disabled   Social History Main Topics  . Smoking status: Current Every Day Smoker    Packs/day: 1.00    Years: 50.00    Types: Cigarettes  . Smokeless tobacco: Never Used  . Alcohol use 0.0 oz/week  . Drug use: No  . Sexual activity: Not Asked   Other Topics Concern  . None   Social History Narrative   Widowed in 2009.     Family History:  The patient's family history includes AAA (abdominal aortic aneurysm) in her mother; Diabetes in her brother; Emphysema in her father and mother; Heart attack in her brother and mother; Heart attack (age of onset: 42) in her father; Heart disease in her brother, father, mother, and other; Hyperlipidemia in her brother, father, and mother; Hypertension in her brother, father, and mother; Peripheral vascular disease in her father.   ROS:   Please see the history of present illness.    ROS All other systems reviewed and are negative.   PHYSICAL EXAM:   VS:  BP 124/62   Pulse (!) 58   Ht 5\' 5"  (1.651 m)   Wt 150 lb 6.4 oz (68.2 kg)   BMI 25.03 kg/m    GEN: Well nourished, well developed, in no acute distress  HEENT: normal  Neck: no JVD, carotid bruits, or masses Cardiac: RRR; no murmurs, rubs, or gallops,no edema  Respiratory:  clear to auscultation bilaterally, normal work of breathing GI: soft, nontender, nondistended, + BS MS: no deformity or atrophy  Skin: warm and dry, no rash Neuro:  Alert and Oriented x 3, Strength and  sensation are intact Psych: euthymic mood, full affect  Wt Readings from Last  3 Encounters:  02/22/17 150 lb 6.4 oz (68.2 kg)  01/28/16 151 lb (68.5 kg)  10/27/15 149 lb 12.8 oz (67.9 kg)      Studies/Labs Reviewed:   EKG:  EKG is not  ordered today.   Recent Labs: No results found for requested labs within last 8760 hours.   Lipid Panel    Component Value Date/Time   CHOL 181 10/07/2015 0655   TRIG 430 (H) 10/07/2015 0655   HDL 27 (L) 10/07/2015 0655   CHOLHDL 6.7 10/07/2015 0655   VLDL UNABLE TO CALCULATE IF TRIGLYCERIDE OVER 400 mg/dL 10/07/2015 0655   LDLCALC UNABLE TO CALCULATE IF TRIGLYCERIDE OVER 400 mg/dL 10/07/2015 0655   LDLDIRECT 78.9 05/07/2008 1301    Additional studies/ records that were reviewed today include:  ABI 01/28/16 R 0.76 L 0.52  Carotid US 01/28/16 R 40-59% L CEA patent  Patent L subclavian stent  Abdominal US 01/28/16 Distal AAA 4.4 cm  Echo 10/08/2015: Study Conclusions  - Left ventricle: The cavity size was normal. Systolic function was normal. The estimated ejection fraction was in the range of 55% to 60%. 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 - Mitral valve: Mitral valve repair with annuloplasty ring intact. 24-mm ring 03/05/1011 Mean gradient (D): 10 mm Hg. Valve area by continuity equation (using LVOT flow): 1.06 cm^2.  Impressions:  - Compared to the prior study, there has been no significant interval change.  LHC 06/25/15 1. Widely patent LIMA-LAD graft 2. Successful PCI of the SVG-right PDA with a DES using distal embolic protection with a Spider device - 3 12 mm Promus DES  LHC 06/24/15 LM: 100% LAD: Mid 100% RI: ok LCx: ok RCA: Ostial 85%, prox 55% S-RPDA 90% S-OM1 ok L-LAD ok The patient has severe native vessel disease as described with left main occlusion and ostial RCA stenosis that is severe.  She has severe stenosis of the proximal SVG-RCA that will require PCI. Fortunately  she has elevated creatinine level and we were unable to visualize the LIMA graft.    PV A-gram 06/24/15 Impression: #1 80% left subclavian artery stenosis successfully treated using a 8 x 18 balloon expandable stent with residual stenosis less than 10% #2 type I aortic arch    ASSESSMENT & PLAN:    1. CAD s/p CABG, PCI of the saphenous vein graft to right PDA and stenting to left subclavian to improve flow to LIMA 2. - No angina. Continue aspirin, Plavix, Imdur and statin.  2. Mitral valve disorder s/p repair with maze surgery and left atrial appendage clipping.  - No syncope or dizziness. Intermittent palpitation. She want's to wait for further evaluation with monitor. Repeat echocardiogram.  3. Peripheral artery diease  - Followed by vascular surgery.  ABI 01/28/16 R 0.76 L 0.52  Carotid US 01/28/16 R 40-59% L CEA patent  Patent L subclavian stent  Abdominal US 01/28/16 Distal AAA 4.4 cm  4. Hypertensive heart disease with heart failure  - Blood pressure stable and well controlled on current regimen. Continue daily Lasix.  5. Tobacco abuse - Advised cessation. Education given. She is not interested in quitting.  6. Chronic kidney disease, stage IV - Hasn't seen Dr. Posey Pronto lately. Recent lab work by PCP showed stable serum creatinine around 2.5 per patient. Labs on available for review.   Medication Adjustments/Labs and Tests Ordered: Current medicines are reviewed at length with the patient today.  Concerns regarding medicines are outlined above.  Medication changes, Labs and Tests ordered today  are listed in the Patient Instructions below. Patient Instructions  Medication Instructions:  Your physician recommends that you continue on your current medications as directed. Please refer to the Current Medication list given to you today.  Labwork: NONE  Testing/Procedures: Your physician has requested that you have an echocardiogram.  Echocardiography is a painless test that uses sound waves to create images of your heart. It provides your doctor with information about the size and shape of your heart and how well your heart's chambers and valves are working. This procedure takes approximately one hour. There are no restrictions for this procedure.  Follow-Up: Your physician recommends that you schedule a follow-up appointment in: 3 to 4 months with Dr. Burt Knack.  If you need a refill on your cardiac medications before your next appointment, please call your pharmacy.      Jarrett Soho, [place]  02/22/2017 12:01 PM    [facility] [place], [place], [place]  [address] Phone: [phone]; Fax: [phone]

## 2017-02-22 ENCOUNTER — Ambulatory Visit (INDEPENDENT_AMBULATORY_CARE_PROVIDER_SITE_OTHER): Payer: Medicare Other | Admitting: Physician Assistant

## 2017-02-22 ENCOUNTER — Encounter: Payer: Self-pay | Admitting: Physician Assistant

## 2017-02-22 ENCOUNTER — Encounter (INDEPENDENT_AMBULATORY_CARE_PROVIDER_SITE_OTHER): Payer: Self-pay

## 2017-02-22 VITALS — BP 124/62 | HR 58 | Ht 65.0 in | Wt 150.4 lb

## 2017-02-22 DIAGNOSIS — I714 Abdominal aortic aneurysm, without rupture, unspecified: Secondary | ICD-10-CM

## 2017-02-22 DIAGNOSIS — Z9889 Other specified postprocedural states: Secondary | ICD-10-CM | POA: Diagnosis not present

## 2017-02-22 DIAGNOSIS — Z720 Tobacco use: Secondary | ICD-10-CM

## 2017-02-22 DIAGNOSIS — I10 Essential (primary) hypertension: Secondary | ICD-10-CM | POA: Diagnosis not present

## 2017-02-22 DIAGNOSIS — N184 Chronic kidney disease, stage 4 (severe): Secondary | ICD-10-CM

## 2017-02-22 DIAGNOSIS — I771 Stricture of artery: Secondary | ICD-10-CM | POA: Diagnosis not present

## 2017-02-22 DIAGNOSIS — I739 Peripheral vascular disease, unspecified: Secondary | ICD-10-CM

## 2017-02-22 DIAGNOSIS — I059 Rheumatic mitral valve disease, unspecified: Secondary | ICD-10-CM

## 2017-02-22 MED ORDER — FUROSEMIDE 20 MG PO TABS: 20.0000 mg | ORAL_TABLET | Freq: Every day | ORAL | 6 refills | Status: DC

## 2017-02-22 NOTE — Patient Instructions (Signed)
Medication Instructions:  Your physician recommends that you continue on your current medications as directed. Please refer to the Current Medication list given to you today.  Labwork: NONE  Testing/Procedures: Your physician has requested that you have an echocardiogram. Echocardiography is a painless test that uses sound waves to create images of your heart. It provides your doctor with information about the size and shape of your heart and how well your heart's chambers and valves are working. This procedure takes approximately one hour. There are no restrictions for this procedure.  Follow-Up: Your physician recommends that you schedule a follow-up appointment in: 3 to 4 months with Dr. Burt Knack.  If you need a refill on your cardiac medications before your next appointment, please call your pharmacy.

## 2017-03-07 ENCOUNTER — Other Ambulatory Visit (HOSPITAL_COMMUNITY): Payer: Self-pay

## 2017-03-16 ENCOUNTER — Other Ambulatory Visit (HOSPITAL_COMMUNITY): Payer: Self-pay

## 2017-03-24 ENCOUNTER — Encounter (HOSPITAL_COMMUNITY): Payer: Self-pay

## 2017-03-24 ENCOUNTER — Ambulatory Visit: Payer: Self-pay | Admitting: Family

## 2017-03-24 ENCOUNTER — Other Ambulatory Visit (HOSPITAL_COMMUNITY): Payer: Self-pay

## 2017-03-28 ENCOUNTER — Encounter: Payer: Self-pay | Admitting: Family

## 2017-03-31 ENCOUNTER — Other Ambulatory Visit: Payer: Self-pay

## 2017-03-31 ENCOUNTER — Ambulatory Visit (HOSPITAL_COMMUNITY): Payer: Medicare Other | Attending: Cardiology

## 2017-03-31 DIAGNOSIS — I714 Abdominal aortic aneurysm, without rupture, unspecified: Secondary | ICD-10-CM

## 2017-03-31 DIAGNOSIS — Z9889 Other specified postprocedural states: Secondary | ICD-10-CM

## 2017-03-31 DIAGNOSIS — Z951 Presence of aortocoronary bypass graft: Secondary | ICD-10-CM | POA: Diagnosis not present

## 2017-03-31 DIAGNOSIS — I059 Rheumatic mitral valve disease, unspecified: Secondary | ICD-10-CM | POA: Diagnosis not present

## 2017-03-31 DIAGNOSIS — I771 Stricture of artery: Secondary | ICD-10-CM | POA: Diagnosis not present

## 2017-04-01 ENCOUNTER — Telehealth: Payer: Self-pay | Admitting: Physician Assistant

## 2017-04-01 NOTE — Telephone Encounter (Signed)
Patient returning call for results 

## 2017-04-08 ENCOUNTER — Ambulatory Visit (INDEPENDENT_AMBULATORY_CARE_PROVIDER_SITE_OTHER)
Admission: RE | Admit: 2017-04-08 | Discharge: 2017-04-08 | Disposition: A | Discharge status: Home / Self Care | Payer: Medicare Other | Source: Ambulatory Visit | Attending: Family | Admitting: Family

## 2017-04-08 ENCOUNTER — Ambulatory Visit (HOSPITAL_COMMUNITY)
Admission: RE | Admit: 2017-04-08 | Discharge: 2017-04-08 | Disposition: A | Discharge status: Home / Self Care | Payer: Medicare Other | Source: Ambulatory Visit | Attending: Family | Admitting: Family

## 2017-04-08 ENCOUNTER — Ambulatory Visit (INDEPENDENT_AMBULATORY_CARE_PROVIDER_SITE_OTHER): Payer: Medicare Other | Admitting: Family

## 2017-04-08 ENCOUNTER — Encounter: Payer: Self-pay | Admitting: Family

## 2017-04-08 VITALS — BP 176/73 | HR 77 | Temp 98.7°F | Resp 18 | Ht 65.0 in | Wt 148.0 lb

## 2017-04-08 DIAGNOSIS — Z9889 Other specified postprocedural states: Secondary | ICD-10-CM | POA: Diagnosis not present

## 2017-04-08 DIAGNOSIS — I771 Stricture of artery: Secondary | ICD-10-CM | POA: Diagnosis not present

## 2017-04-08 DIAGNOSIS — I10 Essential (primary) hypertension: Secondary | ICD-10-CM | POA: Diagnosis not present

## 2017-04-08 DIAGNOSIS — F17200 Nicotine dependence, unspecified, uncomplicated: Secondary | ICD-10-CM | POA: Diagnosis not present

## 2017-04-08 DIAGNOSIS — I714 Abdominal aortic aneurysm, without rupture, unspecified: Secondary | ICD-10-CM

## 2017-04-08 DIAGNOSIS — I701 Atherosclerosis of renal artery: Secondary | ICD-10-CM | POA: Diagnosis not present

## 2017-04-08 DIAGNOSIS — I779 Disorder of arteries and arterioles, unspecified: Secondary | ICD-10-CM | POA: Insufficient documentation

## 2017-04-08 DIAGNOSIS — I6523 Occlusion and stenosis of bilateral carotid arteries: Secondary | ICD-10-CM | POA: Insufficient documentation

## 2017-04-08 DIAGNOSIS — F17210 Nicotine dependence, cigarettes, uncomplicated: Secondary | ICD-10-CM | POA: Insufficient documentation

## 2017-04-08 DIAGNOSIS — Z95828 Presence of other vascular implants and grafts: Secondary | ICD-10-CM | POA: Insufficient documentation

## 2017-04-08 LAB — VAS US CAROTID
LCCADDIAS: -20 cm/s
LCCADSYS: -99 cm/s
LEFT ECA DIAS: -29 cm/s
LICADSYS: -137 cm/s
LICAPDIAS: 41 cm/s
LICAPSYS: 186 cm/s
Left CCA prox dias: 17 cm/s
Left CCA prox sys: 69 cm/s
Left ICA dist dias: -21 cm/s
RCCADSYS: -148 cm/s
RIGHT CCA MID DIAS: 16 cm/s
RIGHT ECA DIAS: -8 cm/s
RIGHT VERTEBRAL DIAS: 19 cm/s
Right CCA prox dias: 10 cm/s
Right CCA prox sys: 79 cm/s

## 2017-04-08 NOTE — Progress Notes (Signed)
[facility] [place] HISTORY AND PHYSICAL   MRN : [id number]  History of Present Illness:   [name] is a 66 y.o. female patient of Dr. [name] who returns for follow-up regarding the left subclavian stent placed by Dr. Trula Slade in October 2016. She is also s/p left CEA in 2009. We are also monitoring her AAA. Pt is also s/p placement of bilateral renal stents in 2004. Patient had left arm claudication symptoms as well as retrograde flow in left vertebral artery and possibly LIMA graft. Stenting was successful in left arm, claudication symptoms have been relieved. Patient had coronary angiography by Dr. Burt Knack a few days later with stenting of the coronary artery. Patient has had previous valve replacement and LIMA graft by Dr. Roxy Manns. Currently patient denies any cardiac symptoms. She does have lower extremity symptoms bilaterally to affect her after walking about 1 block. She had an MRI recently ordered by her medical doctor to rule out spinal stenosis and apparently there was no evidence of spinal stenosis.   She has a hot feeling in her mid low spine that radiates around both hips to anterior thighs and radiates down both legs; this seems to happen when she gets up from a sitting position and walks, but will rarely happen when she is sitting. This is relieved by bending at her waist.  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   Pt denies any new back pain, denies abdominal pain.   After walking a block she has bilateral hip pain that radiates distally to both feet, this is relieved by stopping and bending forward at her waist.    Pt states she did not take her blood pressure medications this AM as she was not sure that she should before the abdominal US today. Pt states she also has white coat syndrome  with her blood pressure increasing. Pt denies any hx of stroke or TIA.  Dr. [name] last saw pt on 07/29/15. At that timet he left carotid endarterectomy site remained widely patent with minimal stenosis. The left subclavian artery was widely patent with equal pressures in the right and left upper extremities. Previous exam revealed 60 mm gradient right higher than left. There was also antegrade flow in the left vertebral artery with triphasic brachial artery waveforms bilaterally.  Most recent previous ABIs were 0.8 on the right and . 5 on the left which is stable. Recent left subclavian stenting for left arm claudication and possible cardiac syndrome due to retrograde flow in LIMA graft Recent cardiac cath with PTA and stenting by Dr. 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  Patient was advised to return in 6 months to see nurse practitioner with carotid duplex exam, ABIs, duplex exam of abdominal aortic aneurysm.  Pt states her blood pressure at home [id number] systolic, states it increases in a doctor office.    She states her last serum creatinine was 2.5 in May 2018, checked by her PCP, she sees Dr. P[facility]y Pronto at [facility].   Pt Diabetic: yes, states her last A1C was 5.9, pt states her DM has  always been in good control Pt smoker: smoker  (1 ppd since she was about 66 yrs old)  Pt meds include: Statin : yes ASA: yes Other anticoagulants/antiplatelets: Plavix   Current Outpatient Prescriptions  Medication Sig Dispense Refill  . albuterol (PROVENTIL HFA;VENTOLIN HFA) 108 (90 Base) MCG/ACT inhaler Inhale 2 puffs into the lungs every 4 (four) hours as needed for wheezing or shortness of breath. ProAir 1 each 0  . allopurinol  (ZYLOPRIM) 100 MG tablet Take 100 mg by mouth daily.  2  . ALPRAZolam (XANAX) 0.5 MG tablet Take 0.5 mg by mouth 2 (two) times daily as needed. (anxiety)    . amLODipine (NORVASC) 10 MG tablet Take 10 mg by mouth daily.      Marland Kitchen aspirin 81 MG chewable tablet Chew 1 tablet (81 mg total) by mouth daily.    Marland Kitchen atorvastatin (LIPITOR) 40 MG tablet Take 40 mg by mouth daily.    . carvedilol (COREG) 25 MG tablet TAKE 1 TABLET BY MOUTH 2 TIMES A DAY WITH A MEAL 60 tablet 9  . cloNIDine (CATAPRES) 0.2 MG tablet Take 1 tablet (0.2 mg total) by mouth 3 (three) times daily. 90 tablet 11  . clopidogrel (PLAVIX) 75 MG tablet Take 1 tablet (75 mg total) by mouth daily with breakfast. 90 tablet 3  . famotidine (PEPCID) 20 MG tablet TAKE 1 TABLET (20 MG TOTAL) BY MOUTH DAILY BEFORE SUPPER. 30 tablet 2  . fenofibrate 160 MG tablet Take 1 tablet (160 mg total) by mouth daily. 30 tablet 1  . ferrous gluconate (FERGON) 325 MG tablet Take 325 mg by mouth 2 (two) times daily.     . furosemide (LASIX) 20 MG tablet Take 1 tablet (20 mg total) by mouth daily. MAY TAKE 1 ADDITIONAL TABLET AS NEEDED FOR WEIGHT GAIN. 45 tablet 6  . glipiZIDE (GLUCOTROL XL) 2.5 MG 24 hr tablet Take 2.5 mg by mouth daily.  11  . hydrALAZINE (APRESOLINE) 100 MG tablet Take 100 mg by mouth 3 (three) times daily.    Marland Kitchen HYDROcodone-acetaminophen (NORCO/VICODIN) 5-325 MG per tablet Take 1 tablet by mouth 3 (three) times daily as needed for moderate pain.     . isosorbide mononitrate (IMDUR) 30 MG 24 hr tablet Take 0.5 tablets (15 mg total) by mouth daily. 45 tablet 3  . omega-3 acid ethyl esters (LOVAZA) 1 g capsule Take 2 capsules (2 g total) by mouth 2 (two) times daily. 30 capsule 1  . pantoprazole (PROTONIX) 40 MG tablet Take 1 tablet (40 mg total) by mouth daily. 30 tablet 11  . predniSONE (DELTASONE) 5 MG tablet Take 10 mg by mouth.    . sertraline (ZOLOFT) 100 MG tablet Take 100 mg by mouth daily.  2  . zolpidem (AMBIEN) 10 MG tablet Take 10 mg  by mouth at bedtime.     No current facility-administered medications for this visit.     Past Medical History:  Diagnosis Date  . AAA (abdominal aortic aneurysm) ([place]) 5/09   3.7x3.3 by u/s 2009  . Abnormal EKG    deep TW inversions chronic  . Anemia   . CAD (coronary artery disease)    a. CABG 03/2011 LIMA to LAD, SVG to OM, SVG to PDA. b. cath 06/25/2015 DES to SVG to rPDA, patent LIMA to LAD, patent SVG to OM  . carotid stenosis 5/09   S/p L CEA ;  60-79% bilat ICA by preCABG dopplers 7/12  . CHF (congestive heart failure) ([place])   .  Chronic back pain    "all over" (06/24/2015)  . Chronic bronchitis ([place])    "get it pretty much q yr" (06/24/2015)  . CKD (chronic kidney disease)   . Complication of anesthesia 1966   "problem w/ether"  . COPD (chronic obstructive pulmonary disease) ([place])   . Coronary artery disease 5/09   s/p CABG 7/12   L-LAD, S-OM, S-PDA  . Depression   . GERD (gastroesophageal reflux disease)    With hiatal hernia  . GIB (gastrointestinal bleeding) 9/11   S/p EGD with cautery at [facility]  . Heart murmur   . History of blood transfusion "a few times"   "all related to anemia" (06/24/2015)  . History of hiatal hernia   . Hyperlipidemia   . Hypertension   . Mitral regurgitation    3+ MR by intraoperative TEE;  s/p MV repair with Dr. Roxy Manns 7/12  . Myocardial infarction [facility]) 2012 "several"  . On home oxygen therapy    "2 liters at night; negative for sleep apnea"  . PAD (peripheral artery disease) ([facility])    Severe; s/p bilateral renal artery stents, moderate in-stent restenosis  . Paroxysmal atrial fibrillation ([facility])    coumadin;  echo 9/07: EF 60%, mild LVH;  s/p Cox Maze 7/12 with LAA clipping  . Subclavian artery stenosis, left ([facility])    stented by Dr. Trula Slade on 10/18 to help flow of her LIMA to LAD  . Type II diabetes mellitus ([place])     Social History Social History  Substance Use Topics  . Smoking status: Current Every Day Smoker    Packs/day: 1.00     Years: 50.00    Types: Cigarettes  . Smokeless tobacco: Never Used  . Alcohol use 0.0 oz/week    Family History Family History  Problem Relation Age of Onset  . Emphysema Mother   . Heart disease Mother        before age 16  . Hypertension Mother   . Hyperlipidemia Mother   . Heart attack Mother   . AAA (abdominal aortic aneurysm) Mother        rupture  . Heart attack Father 30  . Emphysema Father   . Heart disease Father        before age 22  . Hyperlipidemia Father   . Hypertension Father   . Peripheral vascular disease Father   . Diabetes Brother   . Heart disease Brother        before age 52  . Hyperlipidemia Brother   . Hypertension Brother   . Heart attack Brother        CABG  . Heart disease Other        Vascular disease in grandparents, uncles and dad    Surgical History Past Surgical History:  Procedure Laterality Date  . ABDOMINAL HYSTERECTOMY  1990's  . APPENDECTOMY  Aug. 11, 2016   Ruptured  . BOWEL RESECTION  2013  . CARDIAC CATHETERIZATION N/A 06/24/2015   Procedure: Left Heart Cath and Cors/Grafts Angiography;  Surgeon: Leonie Man, MD;  Location: [place] [facility];  Service: Cardiovascular;  Laterality: N/A;  . CARDIAC CATHETERIZATION N/A 06/25/2015   Procedure: Coronary Stent Intervention;  Surgeon: Sherren Mocha, MD;  Location: [place] [facility];  Service: Cardiovascular;  Laterality: N/A;  . CAROTID ENDARTERECTOMY Left   . CORONARY ANGIOPLASTY    . CORONARY ARTERY BYPASS GRAFT  03/05/2011   CABG X3 (LIMA to LAD, SVG to OM, SVG to PDA, EVH via left  thigh  . CORONARY STENT PLACEMENT    . LACERATION REPAIR Right 1990's   WRIST  . MAZE  03/05/2011   complete biatrial lesion set with clipping of LA appendage  . MITRAL VALVE REPAIR  03/05/2011   12mm Memo 3D ring annuloplasty for ischemic MR  . PERIPHERAL VASCULAR CATHETERIZATION N/A 06/24/2015   Procedure: Aortic Arch Angiography;  Surgeon: Serafina Mitchell, MD;  Location: [place]  [facility];  Service: Cardiovascular;  Laterality: N/A;  . PERIPHERAL VASCULAR CATHETERIZATION  06/24/2015   Procedure: Peripheral Vascular Intervention;  Surgeon: Serafina Mitchell, MD;  Location: [place] [facility];  Service: Cardiovascular;;  . PERIPHERAL VASCULAR CATHETERIZATION N/A 06/24/2015   Procedure: Abdominal Aortogram;  Surgeon: Serafina Mitchell, MD;  Location: [place] [facility];  Service: Cardiovascular;  Laterality: N/A;  . RENAL ANGIOGRAM Bilateral 09/11/2013   Procedure: RENAL ANGIOGRAM;  Surgeon: Serafina Mitchell, MD;  Location: [facility] CATH LAB;  Service: Cardiovascular;  Laterality: Bilateral;  . RIGHT FEMORAL-POPLITEAL BYPASS      Allergies  Allergen Reactions  . Codeine Itching  . Contrast Media [Iodinated Diagnostic Agents] Itching and Other (See Comments)    Itching of feet  . Ioxaglate Itching and Other (See Comments)    Itching of feet  . Metrizamide Itching and Other (See Comments)    Itching of feet  . Percocet [Oxycodone-Acetaminophen] Itching and Other (See Comments)    Tolerates Hydrocodone    Current Outpatient Prescriptions  Medication Sig Dispense Refill  . albuterol (PROVENTIL HFA;VENTOLIN HFA) 108 (90 Base) MCG/ACT inhaler Inhale 2 puffs into the lungs every 4 (four) hours as needed for wheezing or shortness of breath. ProAir 1 each 0  . allopurinol (ZYLOPRIM) 100 MG tablet Take 100 mg by mouth daily.  2  . ALPRAZolam (XANAX) 0.5 MG tablet Take 0.5 mg by mouth 2 (two) times daily as needed. (anxiety)    . amLODipine (NORVASC) 10 MG tablet Take 10 mg by mouth daily.      Marland Kitchen aspirin 81 MG chewable tablet Chew 1 tablet (81 mg total) by mouth daily.    Marland Kitchen atorvastatin (LIPITOR) 40 MG tablet Take 40 mg by mouth daily.    . carvedilol (COREG) 25 MG tablet TAKE 1 TABLET BY MOUTH 2 TIMES A DAY WITH A MEAL 60 tablet 9  . cloNIDine (CATAPRES) 0.2 MG tablet Take 1 tablet (0.2 mg total) by mouth 3 (three) times daily. 90 tablet 11  . clopidogrel (PLAVIX) 75 MG tablet Take 1  tablet (75 mg total) by mouth daily with breakfast. 90 tablet 3  . famotidine (PEPCID) 20 MG tablet TAKE 1 TABLET (20 MG TOTAL) BY MOUTH DAILY BEFORE SUPPER. 30 tablet 2  . fenofibrate 160 MG tablet Take 1 tablet (160 mg total) by mouth daily. 30 tablet 1  . ferrous gluconate (FERGON) 325 MG tablet Take 325 mg by mouth 2 (two) times daily.     . furosemide (LASIX) 20 MG tablet Take 1 tablet (20 mg total) by mouth daily. MAY TAKE 1 ADDITIONAL TABLET AS NEEDED FOR WEIGHT GAIN. 45 tablet 6  . glipiZIDE (GLUCOTROL XL) 2.5 MG 24 hr tablet Take 2.5 mg by mouth daily.  11  . hydrALAZINE (APRESOLINE) 100 MG tablet Take 100 mg by mouth 3 (three) times daily.    Marland Kitchen HYDROcodone-acetaminophen (NORCO/VICODIN) 5-325 MG per tablet Take 1 tablet by mouth 3 (three) times daily as needed for moderate pain.     . isosorbide mononitrate (IMDUR) 30 MG 24 hr  tablet Take 0.5 tablets (15 mg total) by mouth daily. 45 tablet 3  . omega-3 acid ethyl esters (LOVAZA) 1 g capsule Take 2 capsules (2 g total) by mouth 2 (two) times daily. 30 capsule 1  . pantoprazole (PROTONIX) 40 MG tablet Take 1 tablet (40 mg total) by mouth daily. 30 tablet 11  . predniSONE (DELTASONE) 5 MG tablet Take 10 mg by mouth.    . sertraline (ZOLOFT) 100 MG tablet Take 100 mg by mouth daily.  2  . zolpidem (AMBIEN) 10 MG tablet Take 10 mg by mouth at bedtime.     No current facility-administered medications for this visit.      REVIEW OF SYSTEMS: See HPI for pertinent positives and negatives.  Physical Examination Vitals:   04/08/17 1332 04/08/17 1335 04/08/17 1339  BP: (!) 192/73 (!) 186/72 (!) 176/73  Pulse: 77    Resp: 18    Temp: 98.7 F (37.1 C)    TempSrc: Oral    SpO2: 97%    Weight: 148 lb (67.1 kg)    Height: 5\' 5"  (1.651 m)     Body mass index is 24.63 kg/m.  General:  WDWN female in NAD GAIT: normal Eyes: PERRLA Pulmonary:  Non-labored respirations, CTAB but with limited air movement.  Cardiac: regular rhythm and  rate, + murmur.  VASCULAR EXAM Carotid Bruits Right Left   Muffled transmitted cardiac murmur Transmitted cardiac murmur     Abdominal aortic pulse is strongly palpable. Radial pulses are 2+ palpable and equal.                                                                                                                                          LE Pulses Right Left       FEMORAL  2+ palpable  faintly palpable        POPLITEAL  not palpable   not palpable       POSTERIOR TIBIAL  1+palpable   not palpable        DORSALIS PEDIS      ANTERIOR TIBIAL 2+palpable  Faintly  palpable     Gastrointestinal: soft, nontender, BS WNL, no r/g, no palpable masses.  Musculoskeletal: no muscle atrophy/wasting. M/S 5/5 in upper extremities, 3/5 in lower extremities, extremities without ischemic changes.  Neurologic: A&O X 3; appropriate affect, speech is normal, CN 2-12 intact, pain and light touch intact in extremities, Motor exam as listed above.     ASSESSMENT:  [name] is a 66 y.o. female who is s/p left subclavian stent placed by Dr. Trula Slade in October 2016. She is also s/p left CEA in 2009. We are also monitoring her AAA. Pt is also s/p placement of bilateral renal stents in 2004.  She has no hx of stroke or TIA. She has pain in both legs with walking and sometimes sitting that is relieved by bending at  her waist;  this is not c/w claudication sx's. She reports that years ago she was diagnosed as having issues with her lumbar spine.  She also sees a nephrologist for stage 4 CKD.  Her creatinine was 2.17 on 10/08/15 (reveiw of records); pt states her most recent serum creatinine was 2.5 in about May of 2018.   Her atherosclerotic risk factors include controlled DM, 46+ years smoking history (currently a ppd), CAD, and CKD. Her risk factors for abdominal aortic aneurysmal growth include smoking and hypertension.  She has multiple significant co morbidities.  She  take and ASA, Plavix, and a statin.  I discussed pt HPI, physical exam, and AAA duplex results with Dr. Donzetta Matters, see Plan. Pt states her serum creatinine in May 2018 was 2.5, requested by her PCP.   DATA  Carotid Duplex (04/08/17): Right ICA: 40 - 59 % stenosis. Left ICA: CEA site <40% stenosis, appears to be due to vessel tortuosity. Left subclavian artery stent with no significant stenosis. No significant stenosis of the bilateral CCA >50% stenosis of the left ECA. Bilateral vertebral artery flow is antegrade.  Bilateral subclavian artery waveforms are normal.  No significant change from previous exam on 01-28-16.   ABI (Date: 04/08/2017)  R:   ABI: 0.69 (was 0.76 on 01-28-16),   PT: tri  DP: tri  TBI:  0.51 (was 0.48)  L:   ABI: 0.53 (was 0.52),   PT: mono  DP: mono  TBI: 0.34 (was 0.23) Slight decline in right ABI from 76% to 69% (moderate arterial occlusive disease) with triphasic waveforms.  Stable left ABI (moderate arterial occlusive disease), monophasic waveforms.    AAA Duplex:  Patent bilateral renal artery stents with no significant increase in the bilateral proximal renal artery velocities. Renal artery waveforms had reduced diastolic flow bilaterally. Aneurysmal dilation of the distal abdominal aorta with a maximum diameter of 4.8 cm; previous maximum diameter was 4.4 cm on 01-28-16. >50% right CIA stenosis.    Plan: The patient was counseled re smoking cessation and given several free resources re smoking cessation.   Follow-up in 6 months with AAA Duplex and see Dr. Trula Slade, carotid duplex and ABI's in a year.  I discussed in depth with the patient the nature of atherosclerosis, and emphasized the importance of maximal medical management including strict control of blood pressure, blood glucose, and lipid levels, obtaining regular exercise, and cessation of smoking.  The patient is aware that without maximal medical management the underlying  atherosclerotic disease process will progress, limiting the benefit of any interventions.  Consideration for repair of AAA would be made when the size approaches 4.8 or 5.0 cm, growth > 1 cm/yr, and symptomatic status. The patient was given information about AAA including signs, symptoms, treatment,  what symptoms should prompt the patient to seek immediate medical care, and how to minimize the risk of enlargement and rupture of aneurysms.   The patient was given information about stroke prevention and what symptoms should prompt the patient to seek immediate medical care.  The patient was given information about PAD including signs, symptoms, treatment, what symptoms should prompt the patient to seek immediate medical care, and risk reduction measures to take.  Thank you for allowing [place] to participate in this patient's care.  Clemon Chambers, RN, MSN, FNP-C [facility] Office: [phone]  Clinic MD: Donzetta Matters 04/08/2017 1:51 PM

## 2017-04-08 NOTE — Patient Instructions (Signed)
Before your next abdominal ultrasound:  Take two Extra-Strength Gas-X capsules at bedtime the night before the test. Take another two Extra-Strength Gas-X capsules 3 hours before the test.  Avoid gas forming foods the day before the test.       Steps to Quit Smoking Smoking tobacco can be bad for your health. It can also affect almost every organ in your body. Smoking puts you and people around you at risk for many serious long-lasting (chronic) diseases. Quitting smoking is hard, but it is one of the best things that you can do for your health. It is never too late to quit. What are the benefits of quitting smoking? When you quit smoking, you lower your risk for getting serious diseases and conditions. They can include:  Lung cancer or lung disease.  Heart disease.  Stroke.  Heart attack.  Not being able to have children (infertility).  Weak bones (osteoporosis) and broken bones (fractures).  If you have coughing, wheezing, and shortness of breath, those symptoms may get better when you quit. You may also get sick less often. If you are pregnant, quitting smoking can help to lower your chances of having a baby of low birth weight. What can I do to help me quit smoking? Talk with your doctor about what can help you quit smoking. Some things you can do (strategies) include:  Quitting smoking totally, instead of slowly cutting back how much you smoke over a period of time.  Going to in-person counseling. You are more likely to quit if you go to many counseling sessions.  Using resources and support systems, such as: ? Database administrator with a Social worker. ? Phone quitlines. ? Careers information officer. ? Support groups or group counseling. ? Text messaging programs. ? Mobile phone apps or applications.  Taking medicines. Some of these medicines may have nicotine in them. If you are pregnant or breastfeeding, do not take any medicines to quit smoking unless your doctor says it is  okay. Talk with your doctor about counseling or other things that can help you.  Talk with your doctor about using more than one strategy at the same time, such as taking medicines while you are also going to in-person counseling. This can help make quitting easier. What things can I do to make it easier to quit? Quitting smoking might feel very hard at first, but there is a lot that you can do to make it easier. Take these steps:  Talk to your family and friends. Ask them to support and encourage you.  Call phone quitlines, reach out to support groups, or work with a Social worker.  Ask people who smoke to not smoke around you.  Avoid places that make you want (trigger) to smoke, such as: ? Bars. ? Parties. ? Smoke-break areas at work.  Spend time with people who do not smoke.  Lower the stress in your life. Stress can make you want to smoke. Try these things to help your stress: ? Getting regular exercise. ? Deep-breathing exercises. ? Yoga. ? Meditating. ? Doing a body scan. To do this, close your eyes, focus on one area of your body at a time from head to toe, and notice which parts of your body are tense. Try to relax the muscles in those areas.  Download or buy apps on your mobile phone or tablet that can help you stick to your quit plan. There are many free apps, such as QuitGuide from the [employer] manager for Disease Control and  Prevention). You can find more support from smokefree.gov and other websites.  This information is not intended to replace advice given to you by your health care provider. Make sure you discuss any questions you have with your health care provider. Document Released: 06/19/2009 Document Revised: 04/20/2016 Document Reviewed: 01/07/2015 Elsevier Interactive Patient Education  2018 [place].      Abdominal Aortic Aneurysm Blood pumps away from the heart through tubes (blood vessels) called arteries. Aneurysms are weak or damaged places in the wall of  an artery. It bulges out like a balloon. An abdominal aortic aneurysm happens in the main artery of the body (aorta). It can burst or tear, causing bleeding inside the body. This is an emergency. It needs treatment right away. What are the causes? The exact cause is unknown. Things that could cause this problem include:  Fat and other substances building up in the lining of a tube.  Swelling of the walls of a blood vessel.  Certain tissue diseases.  Belly (abdominal) trauma.  An infection in the main artery of the body.  What increases the risk? There are things that make it more likely for you to have an aneurysm. These include:  Being over the age of 64 years old.  Having high blood pressure (hypertension).  Being a female.  Being white.  Being very overweight (obese).  Having a family history of aneurysm.  Using tobacco products.  What are the signs or symptoms? Symptoms depend on the size of the aneurysm and how fast it grows. There may not be symptoms. If symptoms occur, they can include:  Pain (belly, side, lower back, or groin).  Feeling full after eating a small amount of food.  Feeling sick to your stomach (nauseous), throwing up (vomiting), or both.  Feeling a lump in your belly that feels like it is beating (pulsating).  Feeling like you will pass out (faint).  How is this treated?  Medicine to control blood pressure and pain.  Imaging tests to see if the aneurysm gets bigger.  Surgery. How is this prevented? To lessen your chance of getting this condition:  Stop smoking. Stop chewing tobacco.  Limit or avoid alcohol.  Keep your blood pressure, blood sugar, and cholesterol within normal limits.  Eat less salt.  Eat foods low in saturated fats and cholesterol. These are found in animal and whole dairy products.  Eat more fiber. Fiber is found in whole grains, vegetables, and fruits.  Keep a healthy weight.  Stay active and exercise  often.  This information is not intended to replace advice given to you by your health care provider. Make sure you discuss any questions you have with your health care provider. Document Released: 12/18/2012 Document Revised: 01/29/2016 Document Reviewed: 09/22/2012 Elsevier Interactive Patient Education  2017 [employer].    Stroke Prevention Some medical conditions and behaviors are associated with an increased chance of having a stroke. You may prevent a stroke by making healthy choices and managing medical conditions. How can I reduce my risk of having a stroke?  Stay physically active. Get at least 30 minutes of activity on most or all days.  Do not smoke. It may also be helpful to avoid exposure to secondhand smoke.  Limit alcohol use. Moderate alcohol use is considered to be: ? No more than 2 drinks per day for men. ? No more than 1 drink per day for nonpregnant women.  Eat healthy foods. This involves: ? Eating 5 or more servings of  fruits and vegetables a day. ? Making dietary changes that address high blood pressure (hypertension), high cholesterol, diabetes, or obesity.  Manage your cholesterol levels. ? Making food choices that are high in fiber and low in saturated fat, trans fat, and cholesterol may control cholesterol levels. ? Take any prescribed medicines to control cholesterol as directed by your health care provider.  Manage your diabetes. ? Controlling your carbohydrate and sugar intake is recommended to manage diabetes. ? Take any prescribed medicines to control diabetes as directed by your health care provider.  Control your hypertension. ? Making food choices that are low in salt (sodium), saturated fat, trans fat, and cholesterol is recommended to manage hypertension. ? Ask your health care provider if you need treatment to lower your blood pressure. Take any prescribed medicines to control hypertension as directed by your health care provider. ? If you  are 36-47 years of age, have your blood pressure checked every 3-5 years. If you are 20 years of age or older, have your blood pressure checked every year.  Maintain a healthy weight. ? Reducing calorie intake and making food choices that are low in sodium, saturated fat, trans fat, and cholesterol are recommended to manage weight.  Stop drug abuse.  Avoid taking birth control pills. ? Talk to your health care provider about the risks of taking birth control pills if you are over 7 years old, smoke, get migraines, or have ever had a blood clot.  Get evaluated for sleep disorders (sleep apnea). ? Talk to your health care provider about getting a sleep evaluation if you snore a lot or have excessive sleepiness.  Take medicines only as directed by your health care provider. ? For some people, aspirin or blood thinners (anticoagulants) are helpful in reducing the risk of forming abnormal blood clots that can lead to stroke. If you have the irregular heart rhythm of atrial fibrillation, you should be on a blood thinner unless there is a good reason you cannot take them. ? Understand all your medicine instructions.  Make sure that other conditions (such as anemia or atherosclerosis) are addressed. Get help right away if:  You have sudden weakness or numbness of the face, arm, or leg, especially on one side of the body.  Your face or eyelid droops to one side.  You have sudden confusion.  You have trouble speaking (aphasia) or understanding.  You have sudden trouble seeing in one or both eyes.  You have sudden trouble walking.  You have dizziness.  You have a loss of balance or coordination.  You have a sudden, severe headache with no known cause.  You have new chest pain or an irregular heartbeat. Any of these symptoms may represent a serious problem that is an emergency. Do not wait to see if the symptoms will go away. Get medical help at once. Call your local emergency services  (911 in [place] Do not drive yourself to the hospital. This information is not intended to replace advice given to you by your health care provider. Make sure you discuss any questions you have with your health care provider. Document Released: 09/30/2004 Document Revised: 01/29/2016 Document Reviewed: 02/23/2013 Elsevier Interactive Patient Education  2017 [place].      Peripheral Vascular Disease Peripheral vascular disease (PVD) is a disease of the blood vessels that are not part of your heart and brain. A simple term for PVD is poor circulation. In most cases, PVD narrows the blood vessels that carry blood from your  heart to the rest of your body. This can result in a decreased supply of blood to your arms, legs, and internal organs, like your stomach or kidneys. However, it most often affects a person's lower legs and feet. There are two types of PVD.  Organic PVD. This is the more common type. It is caused by damage to the structure of blood vessels.  Functional PVD. This is caused by conditions that make blood vessels contract and tighten (spasm).  Without treatment, PVD tends to get worse over time. PVD can also lead to acute ischemic limb. This is when an arm or limb suddenly has trouble getting enough blood. This is a medical emergency. Follow these instructions at home:  Take medicines only as told by your doctor.  Do not use any tobacco products, including cigarettes, chewing tobacco, or electronic cigarettes. If you need help quitting, ask your doctor.  Lose weight if you are overweight, and maintain a healthy weight as told by your doctor.  Eat a diet that is low in fat and cholesterol. If you need help, ask your doctor.  Exercise regularly. Ask your doctor for some good activities for you.  Take good care of your feet. ? Wear comfortable shoes that fit well. ? Check your feet often for any cuts or sores. Contact a doctor if:  You have cramps in your legs while  walking.  You have leg pain when you are at rest.  You have coldness in a leg or foot.  Your skin changes.  You are unable to get or have an erection (erectile dysfunction).  You have cuts or sores on your feet that are not healing. Get help right away if:  Your arm or leg turns cold and blue.  Your arms or legs become red, warm, swollen, painful, or numb.  You have chest pain or trouble breathing.  You suddenly have weakness in your face, arm, or leg.  You become very confused or you cannot speak.  You suddenly have a very bad headache.  You suddenly cannot see. This information is not intended to replace advice given to you by your health care provider. Make sure you discuss any questions you have with your health care provider. Document Released: 11/17/2009 Document Revised: 01/29/2016 Document Reviewed: 01/31/2014 Elsevier Interactive Patient Education  2017 [employer].

## 2017-04-11 NOTE — Addendum Note (Signed)
Addended by: Lianne Cure A on: 04/11/2017 03:22 PM   Modules accepted: Orders

## 2017-06-08 ENCOUNTER — Other Ambulatory Visit (HOSPITAL_COMMUNITY): Payer: Self-pay | Admitting: *Deleted

## 2017-06-09 ENCOUNTER — Ambulatory Visit (HOSPITAL_COMMUNITY)
Admission: RE | Admit: 2017-06-09 | Discharge: 2017-06-09 | Disposition: A | Discharge status: Home / Self Care | Payer: Medicare Other | Source: Ambulatory Visit | Attending: Nephrology | Admitting: Nephrology

## 2017-06-09 DIAGNOSIS — D631 Anemia in chronic kidney disease: Secondary | ICD-10-CM | POA: Insufficient documentation

## 2017-06-09 DIAGNOSIS — N189 Chronic kidney disease, unspecified: Secondary | ICD-10-CM | POA: Insufficient documentation

## 2017-06-09 MED ORDER — SODIUM CHLORIDE 0.9 % IV SOLN
510.0000 mg | INTRAVENOUS | Status: DC
  Administered 2017-06-09: 10:00:00 510 mg via INTRAVENOUS
  Filled 2017-06-09: qty 17

## 2017-06-09 NOTE — Discharge Instructions (Signed)

## 2017-06-16 ENCOUNTER — Ambulatory Visit (HOSPITAL_COMMUNITY): Admission: RE | Admit: 2017-06-16 | Payer: Medicare Other | Source: Ambulatory Visit

## 2017-06-23 ENCOUNTER — Ambulatory Visit (HOSPITAL_COMMUNITY)
Admission: RE | Admit: 2017-06-23 | Discharge: 2017-06-23 | Disposition: A | Discharge status: Home / Self Care | Payer: Medicare Other | Source: Ambulatory Visit | Attending: Nephrology | Admitting: Nephrology

## 2017-06-23 DIAGNOSIS — D631 Anemia in chronic kidney disease: Secondary | ICD-10-CM | POA: Diagnosis not present

## 2017-06-23 MED ORDER — SODIUM CHLORIDE 0.9 % IV SOLN
510.0000 mg | INTRAVENOUS | Status: DC
  Administered 2017-06-23: 510 mg via INTRAVENOUS
  Filled 2017-06-23: qty 17

## 2017-06-30 ENCOUNTER — Encounter: Payer: Self-pay | Admitting: Cardiovascular Disease

## 2017-06-30 ENCOUNTER — Encounter (INDEPENDENT_AMBULATORY_CARE_PROVIDER_SITE_OTHER): Payer: Self-pay

## 2017-06-30 ENCOUNTER — Ambulatory Visit (INDEPENDENT_AMBULATORY_CARE_PROVIDER_SITE_OTHER): Payer: Medicare Other | Admitting: Cardiovascular Disease

## 2017-06-30 VITALS — BP 112/50 | HR 59 | Ht 65.0 in | Wt 152.0 lb

## 2017-06-30 DIAGNOSIS — I209 Angina pectoris, unspecified: Secondary | ICD-10-CM

## 2017-06-30 DIAGNOSIS — I739 Peripheral vascular disease, unspecified: Secondary | ICD-10-CM | POA: Diagnosis not present

## 2017-06-30 DIAGNOSIS — I779 Disorder of arteries and arterioles, unspecified: Secondary | ICD-10-CM

## 2017-06-30 DIAGNOSIS — I059 Rheumatic mitral valve disease, unspecified: Secondary | ICD-10-CM | POA: Diagnosis not present

## 2017-06-30 DIAGNOSIS — I25119 Atherosclerotic heart disease of native coronary artery with unspecified angina pectoris: Secondary | ICD-10-CM | POA: Diagnosis not present

## 2017-06-30 NOTE — Patient Instructions (Signed)
Medication Instructions:  1) STOP ASPIRIN  Labwork: None  Testing/Procedures: Your provider has requested that you have an echocardiogram in 1 year. Echocardiography is a painless test that uses sound waves to create images of your heart. It provides your doctor with information about the size and shape of your heart and how well your heart's chambers and valves are working. This procedure takes approximately one hour. There are no restrictions for this procedure.  Follow-Up: Your provider wants you to follow-up in: 6 months with Richardson Dopp, PA. You will receive a reminder letter in the mail two months in advance. If you don't receive a letter, please call our office to schedule the follow-up appointment.    Your provider wants you to follow-up in: 1 year with Dr. Burt Knack. You will receive a reminder letter in the mail two months in advance. If you don't receive a letter, please call our office to schedule the follow-up appointment.    Any Other Special Instructions Will Be Listed Below (If Applicable).     If you need a refill on your cardiac medications before your next appointment, please call your pharmacy.

## 2017-06-30 NOTE — Progress Notes (Signed)
Cardiology Office Note Date:  07/05/2017   ID:  [name], [name], MRN [id number]  PCP:  Bernerd Limbo, MD  Cardiologist:  Sherren Mocha, MD    Chief Complaint  Patient presents with  . Follow-up  . Shortness of Breath     History of Present Illness: [name] is a 66 y.o. female who presents for follow-up evaluation. She's been followed for peripheral arterial disease and now is primarily followed by vascular surgery. She's had multiple renal artery stenting procedures, carotid endarterectomy, failed lower extremity bypass, surveillance for abdominal aortic aneurysm, and left subclavian stenting. The patient has undergone CABG and cryo-Cox Maze repair with maze surgery and left atrial appendage clipping.  In 2016 she underwent left subclavian stenting to improve inflow into her LIMA graft. She also underwent PCI of the saphenous vein graft to right PDA.  The patient is here alone today.  She reports no major change in symptoms since her last evaluation.  She has chronic exertional dyspnea and generalized fatigue.  The patient is anemic and has been treated with iron infusions.  She has extensive bruising on a combination of aspirin and clopidogrel.  States her blood pressure has been controlled.  No recent chest pain.  Compliant with her medications.   Past Medical History:  Diagnosis Date  . AAA (abdominal aortic aneurysm) ([place]) 5/09   3.7x3.3 by u/s 2009  . Abnormal EKG    deep TW inversions chronic  . Anemia   . CAD (coronary artery disease)    a. CABG 03/2011 LIMA to LAD, SVG to OM, SVG to PDA. b. cath 06/25/2015 DES to SVG to rPDA, patent LIMA to LAD, patent SVG to OM  . carotid stenosis 5/09   S/p L CEA ;  60-79% bilat ICA by preCABG dopplers 7/12  . CHF (congestive heart failure) ([place])   . Chronic back pain    "all over" (06/24/2015)  . Chronic bronchitis ([place])    "get it pretty much q yr" (06/24/2015)  . CKD (chronic kidney disease)   . Complication  of anesthesia 1966   "problem w/ether"  . COPD (chronic obstructive pulmonary disease) ([place])   . Coronary artery disease 5/09   s/p CABG 7/12   L-LAD, S-OM, S-PDA  . Depression   . GERD (gastroesophageal reflux disease)    With hiatal hernia  . GIB (gastrointestinal bleeding) 9/11   S/p EGD with cautery at [facility]  . Heart murmur   . History of blood transfusion "a few times"   "all related to anemia" (06/24/2015)  . History of hiatal hernia   . Hyperlipidemia   . Hypertension   . Mitral regurgitation    3+ MR by intraoperative TEE;  s/p MV repair with Dr. Roxy Manns 7/12  . Myocardial infarction [facility]) 2012 "several"  . On home oxygen therapy    "2 liters at night; negative for sleep apnea"  . PAD (peripheral artery disease) ([facility])    Severe; s/p bilateral renal artery stents, moderate in-stent restenosis  . Paroxysmal atrial fibrillation ([facility])    coumadin;  echo 9/07: EF 60%, mild LVH;  s/p Cox Maze 7/12 with LAA clipping  . Subclavian artery stenosis, left (HCC)    stented by Dr. Trula Slade on 10/18 to help flow of her LIMA to LAD  . Type II diabetes mellitus ([place])     Past Surgical History:  Procedure Laterality Date  . ABDOMINAL HYSTERECTOMY  1990's  . APPENDECTOMY  Aug. 11, 2016  Ruptured  . BOWEL RESECTION  2013  . CARDIAC CATHETERIZATION N/A 06/24/2015   Procedure: Left Heart Cath and Cors/Grafts Angiography;  Surgeon: Leonie Man, MD;  Location: [place] CV LAB;  Service: Cardiovascular;  Laterality: N/A;  . CARDIAC CATHETERIZATION N/A 06/25/2015   Procedure: Coronary Stent Intervention;  Surgeon: Sherren Mocha, MD;  Location: [place] CV [facility];  Service: Cardiovascular;  Laterality: N/A;  . CAROTID ENDARTERECTOMY Left   . CORONARY ANGIOPLASTY    . CORONARY ARTERY BYPASS GRAFT  03/05/2011   CABG X3 (LIMA to LAD, SVG to OM, SVG to PDA, EVH via left thigh  . CORONARY STENT PLACEMENT    . LACERATION REPAIR Right 1990's   WRIST  . MAZE  03/05/2011   complete biatrial  lesion set with clipping of LA appendage  . MITRAL VALVE REPAIR  03/05/2011   58mm Memo 3D ring annuloplasty for ischemic MR  . PERIPHERAL VASCULAR CATHETERIZATION N/A 06/24/2015   Procedure: Aortic Arch Angiography;  Surgeon: Serafina Mitchell, MD;  Location: [place] CV LAB;  Service: Cardiovascular;  Laterality: N/A;  . PERIPHERAL VASCULAR CATHETERIZATION  06/24/2015   Procedure: Peripheral Vascular Intervention;  Surgeon: Serafina Mitchell, MD;  Location: [place] CV LAB;  Service: Cardiovascular;;  . PERIPHERAL VASCULAR CATHETERIZATION N/A 06/24/2015   Procedure: Abdominal Aortogram;  Surgeon: Serafina Mitchell, MD;  Location: [place] [facility];  Service: Cardiovascular;  Laterality: N/A;  . RENAL ANGIOGRAM Bilateral 09/11/2013   Procedure: RENAL ANGIOGRAM;  Surgeon: Serafina Mitchell, MD;  Location: [facility] CATH LAB;  Service: Cardiovascular;  Laterality: Bilateral;  . RIGHT FEMORAL-POPLITEAL BYPASS      Current Outpatient Prescriptions  Medication Sig Dispense Refill  . albuterol (PROVENTIL HFA;VENTOLIN HFA) 108 (90 Base) MCG/ACT inhaler Inhale 2 puffs into the lungs every 4 (four) hours as needed for wheezing or shortness of breath. ProAir 1 each 0  . allopurinol (ZYLOPRIM) 100 MG tablet Take 100 mg by mouth daily.  2  . ALPRAZolam (XANAX) 0.5 MG tablet Take 0.5 mg by mouth 2 (two) times daily as needed. (anxiety)    . amLODipine (NORVASC) 10 MG tablet Take 10 mg by mouth daily.      Marland Kitchen atorvastatin (LIPITOR) 40 MG tablet Take 40 mg by mouth daily.    . carvedilol (COREG) 25 MG tablet TAKE 1 TABLET BY MOUTH 2 TIMES A DAY WITH A MEAL 60 tablet 9  . Cholecalciferol (VITAMIN D3) 5000 units TABS Take 5,000 Units by mouth daily.    . cloNIDine (CATAPRES) 0.2 MG tablet Take 1 tablet (0.2 mg total) by mouth 3 (three) times daily. 90 tablet 11  . clopidogrel (PLAVIX) 75 MG tablet Take 1 tablet (75 mg total) by mouth daily with breakfast. 90 tablet 3  . famotidine (PEPCID) 20 MG tablet TAKE 1 TABLET  (20 MG TOTAL) BY MOUTH DAILY BEFORE SUPPER. 30 tablet 2  . fenofibrate 160 MG tablet Take 1 tablet (160 mg total) by mouth daily. 30 tablet 1  . ferrous gluconate (FERGON) 325 MG tablet Take 325 mg by mouth 2 (two) times daily.     . furosemide (LASIX) 20 MG tablet Take 20 mg by mouth daily. May take an additional tablet day as needed for weight gain  6  . glipiZIDE (GLUCOTROL XL) 2.5 MG 24 hr tablet Take 2.5 mg by mouth daily.  11  . hydrALAZINE (APRESOLINE) 100 MG tablet Take 100 mg by mouth 3 (three) times daily.    Marland Kitchen HYDROcodone-acetaminophen (NORCO/VICODIN) 5-325  MG per tablet Take 1 tablet by mouth 3 (three) times daily as needed for moderate pain.     . isosorbide mononitrate (IMDUR) 30 MG 24 hr tablet Take 0.5 tablets (15 mg total) by mouth daily. 45 tablet 3  . pantoprazole (PROTONIX) 40 MG tablet Take 1 tablet (40 mg total) by mouth daily. 30 tablet 11  . Potassium 99 MG TABS Take 1 tablet by mouth daily.    . sertraline (ZOLOFT) 100 MG tablet Take 50 mg by mouth daily.     No current facility-administered medications for this visit.     Allergies:   Isosorbide nitrate; Oxycodone-acetaminophen; Codeine; Contrast media [iodinated diagnostic agents]; Ioxaglate; Metrizamide; and Percocet [oxycodone-acetaminophen]   Social History:  The patient  reports that she has been smoking Cigarettes.  She has a 50.00 pack-year smoking history. She has never used smokeless tobacco. She reports that she drinks alcohol. She reports that she does not use drugs.   Family History:  The patient's family history includes AAA (abdominal aortic aneurysm) in her mother; Diabetes in her brother; Emphysema in her father and mother; Heart attack in her brother and mother; Heart attack (age of onset: 63) in her father; Heart disease in her brother, father, mother, and other; Hyperlipidemia in her brother, father, and mother; Hypertension in her brother, father, and mother; Peripheral vascular disease in her father.      ROS:  Please see the history of present illness.  Otherwise, review of systems is positive for fatigue, feet swelling.  All other systems are reviewed and negative.    PHYSICAL EXAM: VS:  BP (!) 112/50   Pulse (!) 59   Ht 5\' 5"  (1.651 m)   Wt 152 lb (68.9 kg)   BMI 25.29 kg/m  , BMI Body mass index is 25.29 kg/m. GEN: Well nourished, well developed, in no acute distress  HEENT: normal  Neck: no JVD, no masses. BL carotid bruits Cardiac: RRR with 2/6 SEM at the RUSB              Respiratory:  clear to auscultation bilaterally, normal work of breathing GI: soft, nontender, nondistended, + BS MS: no deformity or atrophy  Ext: no pretibial edema Skin: warm and dry, no rash, extensive bruising Neuro:  Strength and sensation are intact Psych: euthymic mood, full affect  EKG:  EKG is ordered today. The ekg ordered today shows NSR 59 bpm, RBBB, T wave abnormality consider inferolateral ischemia  Recent Labs: No results found for requested labs within last 8760 hours.   Lipid Panel     Component Value Date/Time   CHOL 181 10/07/2015 0655   TRIG 430 (H) 10/07/2015 0655   HDL 27 (L) 10/07/2015 0655   CHOLHDL 6.7 10/07/2015 0655   VLDL UNABLE TO CALCULATE IF TRIGLYCERIDE OVER 400 mg/dL 10/07/2015 0655   LDLCALC UNABLE TO CALCULATE IF TRIGLYCERIDE OVER 400 mg/dL 10/07/2015 0655   LDLDIRECT 78.9 05/07/2008 1301      Wt Readings from Last 3 Encounters:  06/30/17 152 lb (68.9 kg)  06/23/17 148 lb (67.1 kg)  06/09/17 145 lb (65.8 kg)     Cardiac Studies Reviewed: Echo 03-31-2017: Study Conclusions  - Left ventricle: The cavity size was normal. Systolic function was   normal. The estimated ejection fraction was in the range of 55%   to 60%. Wall motion was normal; there were no regional wall   motion abnormalities. There was a reduced contribution of atrial   contraction to ventricular filling, due to  increased ventricular   diastolic pressure or atrial contractile  dysfunction. Doppler   parameters are consistent with a reversible restrictive pattern,   indicative of decreased left ventricular diastolic compliance   and/or increased left atrial pressure (grade 3 diastolic   dysfunction). Doppler parameters are consistent with high   ventricular filling pressure. - Mitral valve: S/P Mitral valve repair with 65mm annuloplasty   ring. The findings are consistent with moderate stenosis. Mean   gradient (D): 8 mm Hg. Peak gradient (D): 21 mm Hg. Valve area by   pressure half-time: 1.59 cm^2. Valve area by continuity equation   (using LVOT flow): 1.2 cm^2. - Left atrium: The atrium was mildly dilated. Anterior-posterior   dimension: 41 mm. - Atrial septum: There was increased thickness of the septum,   consistent with lipomatous hypertrophy.  Impressions:  - Compared to prior echo, there is no significant change in MV mean   gradient (63mmHg in 2017 and now 55mmHg)  ASSESSMENT AND PLAN: 1.  Coronary artery disease, native vessel, with angina: The patient's symptoms are controlled on her current medical program which includes amlodipine, carvedilol, and isosorbide as her antianginal program.  She has continued dual antiplatelet therapy.  Considering her extensive bruising I have recommended that she discontinue aspirin and remain on clopidogrel.  2.  Peripheral arterial disease, extensive: Overall appears stable.  Followed by vascular surgery.  Note history of left subclavian stenting to improve inflow into LIMA graft.  3.  Mitral valve disorder: Most recent echo shows normal function after mitral valve repair.  4. AAA: followed with serial imaging by [facility]  5. HTN: BP controlled on multidrug Rx.   6. Tobacco: has not been ready to quit  7. CKD IV: followed by PCP and nephrology  8. Hyperlipidemia: followed by PCP. Past labs reviewed. Has had low HDL, marked trig elevation. On atorvastatin, fenofibrate, lovaza  Current medicines are reviewed with  the patient today.  The patient does not have concerns regarding medicines.  Labs/ tests ordered today include:   Orders Placed This Encounter  Procedures  . EKG 12-Lead  . ECHOCARDIOGRAM COMPLETE    Disposition:   FU 6 months  Signed, Sherren Mocha, MD  07/05/2017 6:12 AM    [facility] [place], [place], [place]  [address] Phone: [phone]; Fax: [phone]

## 2017-09-16 ENCOUNTER — Other Ambulatory Visit: Payer: Self-pay

## 2017-09-16 DIAGNOSIS — N184 Chronic kidney disease, stage 4 (severe): Secondary | ICD-10-CM

## 2017-09-16 DIAGNOSIS — Z01810 Encounter for preprocedural cardiovascular examination: Secondary | ICD-10-CM

## 2017-10-06 ENCOUNTER — Ambulatory Visit (HOSPITAL_COMMUNITY)
Admission: RE | Admit: 2017-10-06 | Discharge: 2017-10-06 | Disposition: A | Discharge status: Home / Self Care | Payer: Medicare Other | Source: Ambulatory Visit | Attending: Vascular Surgery | Admitting: Vascular Surgery

## 2017-10-06 ENCOUNTER — Ambulatory Visit (INDEPENDENT_AMBULATORY_CARE_PROVIDER_SITE_OTHER)
Admission: RE | Admit: 2017-10-06 | Discharge: 2017-10-06 | Disposition: A | Discharge status: Home / Self Care | Payer: Medicare Other | Source: Ambulatory Visit | Attending: Vascular Surgery | Admitting: Vascular Surgery

## 2017-10-06 DIAGNOSIS — I120 Hypertensive chronic kidney disease with stage 5 chronic kidney disease or end stage renal disease: Secondary | ICD-10-CM | POA: Diagnosis not present

## 2017-10-06 DIAGNOSIS — F17200 Nicotine dependence, unspecified, uncomplicated: Secondary | ICD-10-CM | POA: Diagnosis not present

## 2017-10-06 DIAGNOSIS — N184 Chronic kidney disease, stage 4 (severe): Secondary | ICD-10-CM

## 2017-10-06 DIAGNOSIS — N185 Chronic kidney disease, stage 5: Secondary | ICD-10-CM | POA: Diagnosis present

## 2017-10-06 DIAGNOSIS — I2510 Atherosclerotic heart disease of native coronary artery without angina pectoris: Secondary | ICD-10-CM | POA: Diagnosis not present

## 2017-10-06 DIAGNOSIS — Z01810 Encounter for preprocedural cardiovascular examination: Secondary | ICD-10-CM | POA: Insufficient documentation

## 2017-10-10 ENCOUNTER — Encounter: Payer: Self-pay | Admitting: *Deleted

## 2017-10-10 ENCOUNTER — Ambulatory Visit (INDEPENDENT_AMBULATORY_CARE_PROVIDER_SITE_OTHER): Payer: Medicare Other | Admitting: Surgery

## 2017-10-10 ENCOUNTER — Other Ambulatory Visit: Payer: Self-pay

## 2017-10-10 ENCOUNTER — Ambulatory Visit (HOSPITAL_COMMUNITY)
Admission: RE | Admit: 2017-10-10 | Discharge: 2017-10-10 | Disposition: A | Discharge status: Home / Self Care | Payer: Medicare Other | Source: Ambulatory Visit | Attending: Surgery | Admitting: Surgery

## 2017-10-10 ENCOUNTER — Telehealth: Payer: Self-pay

## 2017-10-10 ENCOUNTER — Encounter: Payer: Self-pay | Admitting: Surgery

## 2017-10-10 ENCOUNTER — Other Ambulatory Visit: Payer: Self-pay | Admitting: *Deleted

## 2017-10-10 VITALS — BP 145/63 | HR 51 | Temp 97.6°F | Resp 16 | Ht 65.0 in | Wt 145.0 lb

## 2017-10-10 DIAGNOSIS — I2510 Atherosclerotic heart disease of native coronary artery without angina pectoris: Secondary | ICD-10-CM | POA: Diagnosis not present

## 2017-10-10 DIAGNOSIS — E785 Hyperlipidemia, unspecified: Secondary | ICD-10-CM | POA: Insufficient documentation

## 2017-10-10 DIAGNOSIS — I714 Abdominal aortic aneurysm, without rupture, unspecified: Secondary | ICD-10-CM

## 2017-10-10 DIAGNOSIS — F17200 Nicotine dependence, unspecified, uncomplicated: Secondary | ICD-10-CM | POA: Diagnosis not present

## 2017-10-10 DIAGNOSIS — I6522 Occlusion and stenosis of left carotid artery: Secondary | ICD-10-CM

## 2017-10-10 DIAGNOSIS — I10 Essential (primary) hypertension: Secondary | ICD-10-CM | POA: Diagnosis not present

## 2017-10-10 DIAGNOSIS — I701 Atherosclerosis of renal artery: Secondary | ICD-10-CM | POA: Insufficient documentation

## 2017-10-10 DIAGNOSIS — N185 Chronic kidney disease, stage 5: Secondary | ICD-10-CM

## 2017-10-10 DIAGNOSIS — E119 Type 2 diabetes mellitus without complications: Secondary | ICD-10-CM | POA: Diagnosis not present

## 2017-10-10 NOTE — Progress Notes (Signed)
[facility] [place]  Patient name: [name] MRN: [id number] DOB: 25-Feb-1951 Sex: female   REASON FOR VISIT:    Dialysis access  HISOTRY OF PRESENT ILLNESS:    [name] is a 67 y.o. female who comes in today for discussions regarding dialysis access.  She is now stage V.  She is right-handed.  Her renal failure secondary to diabetes and hypertension.  Patient has a history of coronary artery disease, status post CABG and coronary angioplasty.  She has had stenting of her left subclavian artery, proximal to a LIMA graft.  She is status post left carotid endarterectomy in 2009.  She is also being followed for abdominal aortic aneurysm.  She has had bilateral renal stents placed in 2004.   PAST MEDICAL HISTORY:   Past Medical History:  Diagnosis Date  . AAA (abdominal aortic aneurysm) ([place]) 5/09   3.7x3.3 by u/s 2009  . Abnormal EKG    deep TW inversions chronic  . Anemia   . CAD (coronary artery disease)    a. CABG 03/2011 LIMA to LAD, SVG to OM, SVG to PDA. b. cath 06/25/2015 DES to SVG to rPDA, patent LIMA to LAD, patent SVG to OM  . carotid stenosis 5/09   S/p L CEA ;  60-79% bilat ICA by preCABG dopplers 7/12  . CHF (congestive heart failure) ([place])   . Chronic back pain    "all over" (06/24/2015)  . Chronic bronchitis ([place])    "get it pretty much q yr" (06/24/2015)  . CKD (chronic kidney disease)   . Complication of anesthesia 1966   "problem w/ether"  . COPD (chronic obstructive pulmonary disease) ([place])   . Coronary artery disease 5/09   s/p CABG 7/12   L-LAD, S-OM, S-PDA  . Depression   . GERD (gastroesophageal reflux disease)    With hiatal hernia  . GIB (gastrointestinal bleeding) 9/11   S/p EGD with cautery at [facility]  . Heart murmur   . History of blood transfusion "a few times"   "all related to anemia" (06/24/2015)  . History of hiatal hernia   . Hyperlipidemia   . Hypertension   . Mitral  regurgitation    3+ MR by intraoperative TEE;  s/p MV repair with Dr. Roxy Manns 7/12  . Myocardial infarction [facility]) 2012 "several"  . On home oxygen therapy    "2 liters at night; negative for sleep apnea"  . PAD (peripheral artery disease) ([facility])    Severe; s/p bilateral renal artery stents, moderate in-stent restenosis  . Paroxysmal atrial fibrillation ([facility])    coumadin;  echo 9/07: EF 60%, mild LVH;  s/p Cox Maze 7/12 with LAA clipping  . Subclavian artery stenosis, left ([facility])    stented by Dr. Trula Slade on 10/18 to help flow of her LIMA to LAD  . Type II diabetes mellitus ([place])      FAMILY HISTORY:   Family History  Problem Relation Age of Onset  . Emphysema Mother   . Heart disease Mother        before age 73  . Hypertension Mother   . Hyperlipidemia Mother   . Heart attack Mother   . AAA (abdominal aortic aneurysm) Mother        rupture  . Heart attack Father 43  . Emphysema Father   . Heart disease Father        before age 83  . Hyperlipidemia Father   . Hypertension Father   . Peripheral  vascular disease Father   . Diabetes Brother   . Heart disease Brother        before age 95  . Hyperlipidemia Brother   . Hypertension Brother   . Heart attack Brother        CABG  . Heart disease Other        Vascular disease in grandparents, uncles and dad    SOCIAL HISTORY:   Social History   Tobacco Use  . Smoking status: Current Every Day Smoker    Packs/day: 1.00    Years: 50.00    Pack years: 50.00    Types: Cigarettes  . Smokeless tobacco: Never Used  Substance Use Topics  . Alcohol use: Yes    Alcohol/week: 0.0 oz     ALLERGIES:   Allergies  Allergen Reactions  . Ioxaglate Itching and Other (See Comments)    Itching of feet  . Metrizamide Itching and Other (See Comments)    Itching of feet Itching of feet  . Isosorbide Other (See Comments)    unknown  . Isosorbide Nitrate Other (See Comments)    unknown  . Oxycodone-Acetaminophen Other (See Comments)     unknown  . Codeine Itching and Other (See Comments)    unknown  . Contrast Media [Iodinated Diagnostic Agents] Itching and Other (See Comments)    Itching of feet  . Percocet [Oxycodone-Acetaminophen] Itching and Other (See Comments)    Tolerates Hydrocodone     CURRENT MEDICATIONS:   Current Outpatient Medications  Medication Sig Dispense Refill  . albuterol (PROVENTIL HFA;VENTOLIN HFA) 108 (90 Base) MCG/ACT inhaler Inhale 2 puffs into the lungs every 4 (four) hours as needed for wheezing or shortness of breath. ProAir 1 each 0  . allopurinol (ZYLOPRIM) 100 MG tablet Take 100 mg by mouth daily.  2  . ALPRAZolam (XANAX) 0.5 MG tablet Take 0.5 mg by mouth 2 (two) times daily as needed. (anxiety)    . amLODipine (NORVASC) 10 MG tablet Take 10 mg by mouth daily.      . carvedilol (COREG) 25 MG tablet TAKE 1 TABLET BY MOUTH 2 TIMES A DAY WITH A MEAL 60 tablet 9  . Cholecalciferol (VITAMIN D3) 5000 units TABS Take 5,000 Units by mouth daily.    . cloNIDine (CATAPRES) 0.2 MG tablet Take 1 tablet (0.2 mg total) by mouth 3 (three) times daily. 90 tablet 11  . clopidogrel (PLAVIX) 75 MG tablet Take 1 tablet (75 mg total) by mouth daily with breakfast. 90 tablet 3  . famotidine (PEPCID) 20 MG tablet TAKE 1 TABLET (20 MG TOTAL) BY MOUTH DAILY BEFORE SUPPER. 30 tablet 2  . fenofibrate 160 MG tablet Take 1 tablet (160 mg total) by mouth daily. 30 tablet 1  . ferrous gluconate (FERGON) 325 MG tablet Take 325 mg by mouth 2 (two) times daily.     . furosemide (LASIX) 20 MG tablet Take 40 mg by mouth daily. May take an additional tablet day as needed for weight gain   6  . glipiZIDE (GLUCOTROL XL) 2.5 MG 24 hr tablet Take 2.5 mg by mouth daily.  11  . hydrALAZINE (APRESOLINE) 100 MG tablet Take 100 mg by mouth 3 (three) times daily.    Marland Kitchen HYDROcodone-acetaminophen (NORCO/VICODIN) 5-325 MG per tablet Take 1 tablet by mouth 3 (three) times daily as needed for moderate pain.     . isosorbide mononitrate  (IMDUR) 30 MG 24 hr tablet Take 0.5 tablets (15 mg total) by mouth daily. 45 tablet 3  .  pantoprazole (PROTONIX) 40 MG tablet Take 1 tablet (40 mg total) by mouth daily. 30 tablet 11  . Potassium 99 MG TABS Take 1 tablet by mouth daily.    Marland Kitchen atorvastatin (LIPITOR) 40 MG tablet Take 40 mg by mouth daily.    . sertraline (ZOLOFT) 100 MG tablet Take 50 mg by mouth daily.     No current facility-administered medications for this visit.     REVIEW OF SYSTEMS:   [X]  denotes positive finding, [ ]  denotes negative finding Cardiac  Comments:  Chest pain or chest pressure:    Shortness of breath upon exertion:    Short of breath when lying flat:    Irregular heart rhythm:        Vascular    Pain in calf, thigh, or hip brought on by ambulation:    Pain in feet at night that wakes you up from your sleep:     Blood clot in your veins:    Leg swelling:         Pulmonary    Oxygen at home:    Productive cough:     Wheezing:         Neurologic    Sudden weakness in arms or legs:     Sudden numbness in arms or legs:     Sudden onset of difficulty speaking or slurred speech:    Temporary loss of vision in one eye:     Problems with dizziness:         Gastrointestinal    Blood in stool:     Vomited blood:         Genitourinary    Burning when urinating:     Blood in urine:        Psychiatric    Major depression:         Hematologic    Bleeding problems:    Problems with blood clotting too easily:        Skin    Rashes or ulcers:        Constitutional    Fever or chills:      PHYSICAL EXAM:   Vitals:   10/10/17 0842 10/10/17 0844 10/10/17 0846  BP: (!) 138/54 (!) 151/63 (!) 145/63  Pulse: (!) 53 (!) 51 (!) 51  Resp: 16    Temp: 97.6 F (36.4 C)    TempSrc: Oral    SpO2: 98%    Weight: 145 lb (65.8 kg)    Height: 5\' 5"  (1.651 m)      GENERAL: The patient is a well-nourished female, in no acute distress. The vital signs are documented above. CARDIAC: There is a  regular rate and rhythm.  VASCULAR: Palpable radial pulse bilaterally.  No carotid bruits. PULMONARY: Non-labored respirations ABDOMEN: Soft and non-tender.  Aorta is nontender MUSCULOSKELETAL: There are no major deformities or cyanosis. NEUROLOGIC: No focal weakness or paresthesias are detected. SKIN: There are no ulcers or rashes noted. PSYCHIATRIC: The patient has a normal affect.  STUDIES:   I have reviewed her ultrasound studies with the following findings  Abdominal ultrasound: Aortic aneurysm with maximum diameter 4.8 cm Upper extremity arterial duplex: Symmetric brachial pressures bilaterally with normal brachial bifurcation Venous mapping: Adequate cephalic veins bilaterally from the wrist to the shoulder  MEDICAL ISSUES:   AAA: We discussed follow-up imaging in 6 months with a noncontrasted CT scan to get the true diameter of her aneurysm.  By ultrasound it has grown over the past 6 months.  We  discussed delaying repair until she was 5 cm.  Certainly repair will be complicated by the status of her kidneys  Carotid/subclavian stenosis: The patient will have follow-up ultrasound imaging in 6 months  Dialysis access: We discussed proceeding with a left arm fistula either a radiocephalic or brachiocephalic.  The location will be determined in the operating room.  We discussed the risks and benefits of the procedure including the risk of non-maturity and the need for future interventions as well as steal syndrome.  All of her questions were answered.  Her surgery has been scheduled for Thursday, February 28.  She will stop her Plavix prior to her operation.    [name], MD [facility] [phone] Pager [phone]

## 2017-10-10 NOTE — Telephone Encounter (Signed)
-----   Message from Sherren Mocha, MD sent at 10/10/2017 12:39 PM EST ----- Regarding: RE: Medication Clearance Should be ok thanks ----- Message ----- From: Willy Eddy, RN Sent: 10/10/2017  10:11 AM To: Sherren Mocha, MD Subject: Medication Clearance                           Requesting Medication clearance. Scheduled for creation of A/V fistula with Dr. Trula Slade on 11/03/17. Will need to hold Plavix for 5 days pre-op. Thank you Archivist.

## 2017-10-10 NOTE — H&P (View-Only) (Signed)
[facility] [place]  Patient name: [name] MRN: [id number] DOB: 11/12/1950 Sex: female   REASON FOR VISIT:    Dialysis access  HISOTRY OF PRESENT ILLNESS:    [name] is a 67 y.o. female who comes in today for discussions regarding dialysis access.  She is now stage V.  She is right-handed.  Her renal failure secondary to diabetes and hypertension.  Patient has a history of coronary artery disease, status post CABG and coronary angioplasty.  She has had stenting of her left subclavian artery, proximal to a LIMA graft.  She is status post left carotid endarterectomy in 2009.  She is also being followed for abdominal aortic aneurysm.  She has had bilateral renal stents placed in 2004.   PAST MEDICAL HISTORY:   Past Medical History:  Diagnosis Date  . AAA (abdominal aortic aneurysm) ([place]) 5/09   3.7x3.3 by u/s 2009  . Abnormal EKG    deep TW inversions chronic  . Anemia   . CAD (coronary artery disease)    a. CABG 03/2011 LIMA to LAD, SVG to OM, SVG to PDA. b. cath 06/25/2015 DES to SVG to rPDA, patent LIMA to LAD, patent SVG to OM  . carotid stenosis 5/09   S/p L CEA ;  60-79% bilat ICA by preCABG dopplers 7/12  . CHF (congestive heart failure) ([place])   . Chronic back pain    "all over" (06/24/2015)  . Chronic bronchitis ([place])    "get it pretty much q yr" (06/24/2015)  . CKD (chronic kidney disease)   . Complication of anesthesia 1966   "problem w/ether"  . COPD (chronic obstructive pulmonary disease) ([place])   . Coronary artery disease 5/09   s/p CABG 7/12   L-LAD, S-OM, S-PDA  . Depression   . GERD (gastroesophageal reflux disease)    With hiatal hernia  . GIB (gastrointestinal bleeding) 9/11   S/p EGD with cautery at [facility]  . Heart murmur   . History of blood transfusion "a few times"   "all related to anemia" (06/24/2015)  . History of hiatal hernia   . Hyperlipidemia   . Hypertension   . Mitral  regurgitation    3+ MR by intraoperative TEE;  s/p MV repair with Dr. Roxy Manns 7/12  . Myocardial infarction [facility]) 2012 "several"  . On home oxygen therapy    "2 liters at night; negative for sleep apnea"  . PAD (peripheral artery disease) ([facility])    Severe; s/p bilateral renal artery stents, moderate in-stent restenosis  . Paroxysmal atrial fibrillation ([facility])    coumadin;  echo 9/07: EF 60%, mild LVH;  s/p Cox [facility] 7/12 with LAA clipping  . Subclavian artery stenosis, left ([facility])    stented by Dr. Trula Slade on 10/18 to help flow of her LIMA to LAD  . Type II diabetes mellitus ([place])      FAMILY HISTORY:   Family History  Problem Relation Age of Onset  . Emphysema Mother   . Heart disease Mother        before age 27  . Hypertension Mother   . Hyperlipidemia Mother   . Heart attack Mother   . AAA (abdominal aortic aneurysm) Mother        rupture  . Heart attack Father 10  . Emphysema Father   . Heart disease Father        before age 34  . Hyperlipidemia Father   . Hypertension Father   . Peripheral  vascular disease Father   . Diabetes Brother   . Heart disease Brother        before age 46  . Hyperlipidemia Brother   . Hypertension Brother   . Heart attack Brother        CABG  . Heart disease Other        Vascular disease in grandparents, uncles and dad    SOCIAL HISTORY:   Social History   Tobacco Use  . Smoking status: Current Every Day Smoker    Packs/day: 1.00    Years: 50.00    Pack years: 50.00    Types: Cigarettes  . Smokeless tobacco: Never Used  Substance Use Topics  . Alcohol use: Yes    Alcohol/week: 0.0 oz     ALLERGIES:   Allergies  Allergen Reactions  . Ioxaglate Itching and Other (See Comments)    Itching of feet  . Metrizamide Itching and Other (See Comments)    Itching of feet Itching of feet  . Isosorbide Other (See Comments)    unknown  . Isosorbide Nitrate Other (See Comments)    unknown  . Oxycodone-Acetaminophen Other (See Comments)     unknown  . Codeine Itching and Other (See Comments)    unknown  . Contrast Media [Iodinated Diagnostic Agents] Itching and Other (See Comments)    Itching of feet  . Percocet [Oxycodone-Acetaminophen] Itching and Other (See Comments)    Tolerates Hydrocodone     CURRENT MEDICATIONS:   Current Outpatient Medications  Medication Sig Dispense Refill  . albuterol (PROVENTIL HFA;VENTOLIN HFA) 108 (90 Base) MCG/ACT inhaler Inhale 2 puffs into the lungs every 4 (four) hours as needed for wheezing or shortness of breath. ProAir 1 each 0  . allopurinol (ZYLOPRIM) 100 MG tablet Take 100 mg by mouth daily.  2  . ALPRAZolam (XANAX) 0.5 MG tablet Take 0.5 mg by mouth 2 (two) times daily as needed. (anxiety)    . amLODipine (NORVASC) 10 MG tablet Take 10 mg by mouth daily.      . carvedilol (COREG) 25 MG tablet TAKE 1 TABLET BY MOUTH 2 TIMES A DAY WITH A MEAL 60 tablet 9  . Cholecalciferol (VITAMIN D3) 5000 units TABS Take 5,000 Units by mouth daily.    . cloNIDine (CATAPRES) 0.2 MG tablet Take 1 tablet (0.2 mg total) by mouth 3 (three) times daily. 90 tablet 11  . clopidogrel (PLAVIX) 75 MG tablet Take 1 tablet (75 mg total) by mouth daily with breakfast. 90 tablet 3  . famotidine (PEPCID) 20 MG tablet TAKE 1 TABLET (20 MG TOTAL) BY MOUTH DAILY BEFORE SUPPER. 30 tablet 2  . fenofibrate 160 MG tablet Take 1 tablet (160 mg total) by mouth daily. 30 tablet 1  . ferrous gluconate (FERGON) 325 MG tablet Take 325 mg by mouth 2 (two) times daily.     . furosemide (LASIX) 20 MG tablet Take 40 mg by mouth daily. May take an additional tablet day as needed for weight gain   6  . glipiZIDE (GLUCOTROL XL) 2.5 MG 24 hr tablet Take 2.5 mg by mouth daily.  11  . hydrALAZINE (APRESOLINE) 100 MG tablet Take 100 mg by mouth 3 (three) times daily.    Marland Kitchen HYDROcodone-acetaminophen (NORCO/VICODIN) 5-325 MG per tablet Take 1 tablet by mouth 3 (three) times daily as needed for moderate pain.     . isosorbide mononitrate  (IMDUR) 30 MG 24 hr tablet Take 0.5 tablets (15 mg total) by mouth daily. 45 tablet 3  .  pantoprazole (PROTONIX) 40 MG tablet Take 1 tablet (40 mg total) by mouth daily. 30 tablet 11  . Potassium 99 MG TABS Take 1 tablet by mouth daily.    Marland Kitchen atorvastatin (LIPITOR) 40 MG tablet Take 40 mg by mouth daily.    . sertraline (ZOLOFT) 100 MG tablet Take 50 mg by mouth daily.     No current facility-administered medications for this visit.     REVIEW OF SYSTEMS:   [X]  denotes positive finding, [ ]  denotes negative finding Cardiac  Comments:  Chest pain or chest pressure:    Shortness of breath upon exertion:    Short of breath when lying flat:    Irregular heart rhythm:        Vascular    Pain in calf, thigh, or hip brought on by ambulation:    Pain in feet at night that wakes you up from your sleep:     Blood clot in your veins:    Leg swelling:         Pulmonary    Oxygen at home:    Productive cough:     Wheezing:         Neurologic    Sudden weakness in arms or legs:     Sudden numbness in arms or legs:     Sudden onset of difficulty speaking or slurred speech:    Temporary loss of vision in one eye:     Problems with dizziness:         Gastrointestinal    Blood in stool:     Vomited blood:         Genitourinary    Burning when urinating:     Blood in urine:        Psychiatric    Major depression:         Hematologic    Bleeding problems:    Problems with blood clotting too easily:        Skin    Rashes or ulcers:        Constitutional    Fever or chills:      PHYSICAL EXAM:   Vitals:   10/10/17 0842 10/10/17 0844 10/10/17 0846  BP: (!) 138/54 (!) 151/63 (!) 145/63  Pulse: (!) 53 (!) 51 (!) 51  Resp: 16    Temp: 97.6 F (36.4 C)    TempSrc: Oral    SpO2: 98%    Weight: 145 lb (65.8 kg)    Height: 5\' 5"  (1.651 m)      GENERAL: The patient is a well-nourished female, in no acute distress. The vital signs are documented above. CARDIAC: There is a  regular rate and rhythm.  VASCULAR: Palpable radial pulse bilaterally.  No carotid bruits. PULMONARY: Non-labored respirations ABDOMEN: Soft and non-tender.  Aorta is nontender MUSCULOSKELETAL: There are no major deformities or cyanosis. NEUROLOGIC: No focal weakness or paresthesias are detected. SKIN: There are no ulcers or rashes noted. PSYCHIATRIC: The patient has a normal affect.  STUDIES:   I have reviewed her ultrasound studies with the following findings  Abdominal ultrasound: Aortic aneurysm with maximum diameter 4.8 cm Upper extremity arterial duplex: Symmetric brachial pressures bilaterally with normal brachial bifurcation Venous mapping: Adequate cephalic veins bilaterally from the wrist to the shoulder  MEDICAL ISSUES:   AAA: We discussed follow-up imaging in 6 months with a noncontrasted CT scan to get the true diameter of her aneurysm.  By ultrasound it has grown over the past 6 months.  We  discussed delaying repair until she was 5 cm.  Certainly repair will be complicated by the status of her kidneys  Carotid/subclavian stenosis: The patient will have follow-up ultrasound imaging in 6 months  Dialysis access: We discussed proceeding with a left arm fistula either a radiocephalic or brachiocephalic.  The location will be determined in the operating room.  We discussed the risks and benefits of the procedure including the risk of non-maturity and the need for future interventions as well as steal syndrome.  All of her questions were answered.  Her surgery has been scheduled for Thursday, February 28.  She will stop her Plavix prior to her operation.    [name], MD [facility] [phone] Pager [phone]

## 2017-10-10 NOTE — Telephone Encounter (Signed)
Forwarded to Domenic Moras, RN.

## 2017-10-11 ENCOUNTER — Telehealth: Payer: Self-pay | Admitting: *Deleted

## 2017-10-11 NOTE — Telephone Encounter (Signed)
Call to patient to inform her of surgery time change to arrive at [facility] hospital admitting at 7 am 11/03/17. Verbalized understanding and all other instructions unchanged.

## 2017-10-18 NOTE — Addendum Note (Signed)
Addended by: Lianne Cure A on: 10/18/2017 04:20 PM   Modules accepted: Orders

## 2017-11-01 ENCOUNTER — Encounter (HOSPITAL_COMMUNITY): Payer: Self-pay | Admitting: *Deleted

## 2017-11-01 ENCOUNTER — Telehealth: Payer: Self-pay | Admitting: *Deleted

## 2017-11-01 ENCOUNTER — Other Ambulatory Visit: Payer: Self-pay | Admitting: *Deleted

## 2017-11-01 NOTE — Telephone Encounter (Signed)
Patient called to first ask when she had not been contacted by PAD at [facility] hospital. Stated that she just read her letter and did not stop Plavix until 10/31/17. Checked with Zigmund Daniel who stated "OK to proceed "for surgery on 11/03/17 for HD access. Stressed to patient to Hold till post op and instructed when to start.

## 2017-11-01 NOTE — Progress Notes (Signed)
Patient denies chest pain or shortness of breath. Cardiologist is Dr. Burt Knack. Last Plavix dose 10/31/2017; Per Gomez Cleverly at [facility] they are aware. Below instructions provided regarding blood sugar management.   How to Manage Your Diabetes Before and After Surgery  . If your blood sugar is less than 70 mg/dL, you will need to treat for low blood sugar: o Do not take insulin. o Treat a low blood sugar (less than 70 mg/dL) with  cup of clear juice (cranberry or apple), 4 glucose tablets, OR glucose gel. Recheck blood sugar in 15 minutes after treatment (to make sure it is greater than 70 mg/dL). If your blood sugar is not greater than 70 mg/dL on recheck, call [phone] o  for further instructions. . Report your blood sugar to the short stay nurse when you get to Short Stay.

## 2017-11-02 NOTE — Progress Notes (Addendum)
Anesthesia Chart Review:  Pt is a same day work up.   Pt is a 67 year old female scheduled for L arm AV fistula creation on 11/03/2017 with Harold Barban, MD  - PCP is Bernerd Limbo, MD (notes in care everywhere)  - Nephrologist is Elmarie Shiley, MD - Cardiologist is Sherren Mocha, MD who is aware of upcoming surgery and gave ok to hold plavix. Last office visit 06/30/17  PMH includes:  CAD (s/p CABG 2012; DES to SVG-rPDA 06/25/15), PAF, CHF, severe mitral regurgitation (s/p repair 2012), PAD (s/p B renal artery stents), subclavian artery stenosis (s/p stent 06/2017), AAA (stable 4.8cm by 10/10/17 [place]), carotid stenosis (s/p L CEA), HTN, DM, hyperlipidemia, CKD (stage IV, not yet on dialysis), anemia, OSA (uses O2 at night), GERD. Current smoker. BMI 24  Medications include: Albuterol, amlodipine, Lipitor, carvedilol, clonidine, Plavix, Pepcid, fenofibrate, iron, Lasix, glipizide, hydralazine Imdur, Protonix, potassium. Last dose plavix 10/31/17. (Note pt was also on ASA but it was stopped due to problems with bruising 06/2017 by Dr. Burt Knack)  Labs will be obtained day of surgery - HbA1c was 6.3 on 10/07/15  AAA duplex 10/10/17:  - Abnormal dilitation of the Distal Abdominal aorta. The largest aortic measurement is 4.8 cm. The largest aortic diameter remains essentially unchanged compared to prior exam. Previous diameter measurement was 4.8 cm obtained on 04/08/2017.  Carotid duplex 04/08/17:  1.  40-59% (high end of range) stenosis of the right proximal and mid internal carotid artery. 2.  Patent L CEA site with an elevated velocity in the left proximal/mid internal carotid artery which appears to to vessel tortuosity 3.  Patent left subclavian artery stent with no hemodynamically significant stenosis noted  Echo 03/31/17:  - Left ventricle: The cavity size was normal. Systolic function was normal. The estimated ejection fraction was in the range of 55% to 60%. Wall motion was normal; there were no  regional wall motion abnormalities. There was a reduced contribution of atrial contraction to ventricular filling, due to increased ventricular diastolic pressure or atrial contractile dysfunction. Doppler parameters are consistent with a reversible restrictive pattern, indicative of decreased left ventricular diastolic compliance and/or increased left atrial pressure (grade 3 diastolic dysfunction). Doppler parameters are consistent with high ventricular filling pressure. - Mitral valve: S/P Mitral valve repair with 49mm annuloplasty ring. The findings are consistent with moderate stenosis. Mean gradient (D): 8 mm Hg. Peak gradient (D): 21 mm Hg. Valve area by pressure half-time: 1.59 cm^2. Valve area by continuity equation (using LVOT flow): 1.2 cm^2. - Left atrium: The atrium was mildly dilated. Anterior-posterior dimension: 41 mm. - Atrial septum: There was increased thickness of the septum, consistent with lipomatous hypertrophy. - Impressions: Compared to prior echo, there is no significant change in MV mean gradient (75mmHg in 2017 and now 56mmHg)   PCI 06/25/15:  1. Widely patent LIMA-LAD graft 2. Successful PCI of the SVG-right PDA with a DES   Cardiac cath 06/24/15:  1. Ost LM to LM lesion, 100% stenosed. 2. Ost RCA lesion, 85% stenosed. Prox & RCA lesion, 55% stenosed. 3. Origin to Prox Graft lesion, 90% stenosed. 4. Retrograde flow fills just proximal to PDA-PAV bifurcation. R-L Collaterals via septal to proximal SP1. 5. Mid LAD lesion, 100% stenosed. LIMA Graft unable to be visualized. 6. Elevated LVEDP with low normal EF  If labs acceptable day of surgery, I anticipate pt can proceed with surgery as scheduled.   Willeen Cass, FNP-BC [facility]/Anesthesiology Phone: [phone] 11/02/2017 10:19 AM

## 2017-11-02 NOTE — Anesthesia Preprocedure Evaluation (Addendum)
Anesthesia Evaluation  Patient identified by MRN, date of birth, ID band Patient awake    Reviewed: Allergy & Precautions, H&P , NPO status , Patient's Chart, lab work & pertinent test results, reviewed documented beta blocker date and time   Airway Mallampati: II  TM Distance: >3 FB Neck ROM: Full    Dental no notable dental hx. (+) Edentulous Upper, Edentulous Lower, Dental Advisory Given   Pulmonary sleep apnea , COPD,  COPD inhaler and oxygen dependent, Current Smoker,    Pulmonary exam normal breath sounds clear to auscultation       Cardiovascular Exercise Tolerance: Good hypertension, Pt. on medications and Pt. on home beta blockers + CAD, + Past MI, + CABG, + Peripheral Vascular Disease and +CHF  + Valvular Problems/Murmurs  Rhythm:Regular Rate:Normal     Neuro/Psych Depression negative neurological ROS     GI/Hepatic Neg liver ROS, hiatal hernia, GERD  Medicated and Controlled,  Endo/Other  diabetes, Type 2, Oral Hypoglycemic Agents  Renal/GU Renal InsufficiencyRenal disease  negative genitourinary   Musculoskeletal   Abdominal   Peds  Hematology negative hematology ROS (+)   Anesthesia Other Findings   Reproductive/Obstetrics negative OB ROS                           Anesthesia Physical Anesthesia Plan  ASA: III  Anesthesia Plan: MAC   Post-op Pain Management:    Induction: Intravenous  PONV Risk Score and Plan: 2 and Propofol infusion, Midazolam and Ondansetron  Airway Management Planned: Simple Face Mask  Additional Equipment:   Intra-op Plan:   Post-operative Plan:   Informed Consent: I have reviewed the patients History and Physical, chart, labs and discussed the procedure including the risks, benefits and alternatives for the proposed anesthesia with the patient or authorized representative who has indicated his/her understanding and acceptance.   Dental  advisory given  Plan Discussed with: CRNA  Anesthesia Plan Comments:         Anesthesia Quick Evaluation

## 2017-11-03 ENCOUNTER — Ambulatory Visit (HOSPITAL_COMMUNITY): Payer: Medicare Other | Admitting: Emergency Medicine

## 2017-11-03 ENCOUNTER — Telehealth: Payer: Self-pay | Admitting: Surgery

## 2017-11-03 ENCOUNTER — Encounter (HOSPITAL_COMMUNITY)
Admission: RE | Disposition: A | Discharge status: Home / Self Care | Payer: Self-pay | Source: Ambulatory Visit | Attending: Surgery

## 2017-11-03 ENCOUNTER — Encounter (HOSPITAL_COMMUNITY): Payer: Self-pay | Admitting: *Deleted

## 2017-11-03 ENCOUNTER — Ambulatory Visit (HOSPITAL_COMMUNITY)
Admission: RE | Admit: 2017-11-03 | Discharge: 2017-11-03 | Disposition: A | Discharge status: Home / Self Care | Payer: Medicare Other | Source: Ambulatory Visit | Attending: Surgery | Admitting: Surgery

## 2017-11-03 ENCOUNTER — Other Ambulatory Visit: Payer: Self-pay

## 2017-11-03 DIAGNOSIS — Z7984 Long term (current) use of oral hypoglycemic drugs: Secondary | ICD-10-CM | POA: Insufficient documentation

## 2017-11-03 DIAGNOSIS — Z888 Allergy status to other drugs, medicaments and biological substances status: Secondary | ICD-10-CM | POA: Insufficient documentation

## 2017-11-03 DIAGNOSIS — Z9981 Dependence on supplemental oxygen: Secondary | ICD-10-CM | POA: Diagnosis not present

## 2017-11-03 DIAGNOSIS — I2510 Atherosclerotic heart disease of native coronary artery without angina pectoris: Secondary | ICD-10-CM | POA: Diagnosis not present

## 2017-11-03 DIAGNOSIS — E1151 Type 2 diabetes mellitus with diabetic peripheral angiopathy without gangrene: Secondary | ICD-10-CM | POA: Diagnosis not present

## 2017-11-03 DIAGNOSIS — Z885 Allergy status to narcotic agent status: Secondary | ICD-10-CM | POA: Insufficient documentation

## 2017-11-03 DIAGNOSIS — F17210 Nicotine dependence, cigarettes, uncomplicated: Secondary | ICD-10-CM | POA: Insufficient documentation

## 2017-11-03 DIAGNOSIS — Z8249 Family history of ischemic heart disease and other diseases of the circulatory system: Secondary | ICD-10-CM | POA: Diagnosis not present

## 2017-11-03 DIAGNOSIS — E785 Hyperlipidemia, unspecified: Secondary | ICD-10-CM | POA: Diagnosis not present

## 2017-11-03 DIAGNOSIS — I252 Old myocardial infarction: Secondary | ICD-10-CM | POA: Diagnosis not present

## 2017-11-03 DIAGNOSIS — I509 Heart failure, unspecified: Secondary | ICD-10-CM | POA: Diagnosis not present

## 2017-11-03 DIAGNOSIS — I130 Hypertensive heart and chronic kidney disease with heart failure and stage 1 through stage 4 chronic kidney disease, or unspecified chronic kidney disease: Secondary | ICD-10-CM | POA: Insufficient documentation

## 2017-11-03 DIAGNOSIS — Z79899 Other long term (current) drug therapy: Secondary | ICD-10-CM | POA: Diagnosis not present

## 2017-11-03 DIAGNOSIS — Z9889 Other specified postprocedural states: Secondary | ICD-10-CM | POA: Insufficient documentation

## 2017-11-03 DIAGNOSIS — I714 Abdominal aortic aneurysm, without rupture: Secondary | ICD-10-CM | POA: Insufficient documentation

## 2017-11-03 DIAGNOSIS — K219 Gastro-esophageal reflux disease without esophagitis: Secondary | ICD-10-CM | POA: Insufficient documentation

## 2017-11-03 DIAGNOSIS — E1122 Type 2 diabetes mellitus with diabetic chronic kidney disease: Secondary | ICD-10-CM | POA: Insufficient documentation

## 2017-11-03 DIAGNOSIS — J449 Chronic obstructive pulmonary disease, unspecified: Secondary | ICD-10-CM | POA: Insufficient documentation

## 2017-11-03 DIAGNOSIS — I340 Nonrheumatic mitral (valve) insufficiency: Secondary | ICD-10-CM | POA: Diagnosis not present

## 2017-11-03 DIAGNOSIS — G4730 Sleep apnea, unspecified: Secondary | ICD-10-CM | POA: Diagnosis not present

## 2017-11-03 DIAGNOSIS — Z951 Presence of aortocoronary bypass graft: Secondary | ICD-10-CM | POA: Diagnosis not present

## 2017-11-03 DIAGNOSIS — F329 Major depressive disorder, single episode, unspecified: Secondary | ICD-10-CM | POA: Insufficient documentation

## 2017-11-03 DIAGNOSIS — I480 Paroxysmal atrial fibrillation: Secondary | ICD-10-CM | POA: Diagnosis not present

## 2017-11-03 DIAGNOSIS — N184 Chronic kidney disease, stage 4 (severe): Secondary | ICD-10-CM | POA: Diagnosis not present

## 2017-11-03 HISTORY — PX: AV FISTULA PLACEMENT: SHX1204

## 2017-11-03 LAB — POCT I-STAT 4, (NA,K, GLUC, HGB,HCT)
GLUCOSE: 116 mg/dL — AB (ref 65–99)
HEMATOCRIT: 34 % — AB (ref 36.0–46.0)
HEMOGLOBIN: 11.6 g/dL — AB (ref 12.0–15.0)
Potassium: 3.7 mmol/L (ref 3.5–5.1)
Sodium: 141 mmol/L (ref 135–145)

## 2017-11-03 LAB — GLUCOSE, CAPILLARY: Glucose-Capillary: 150 mg/dL — ABNORMAL HIGH (ref 65–99)

## 2017-11-03 SURGERY — ARTERIOVENOUS (AV) FISTULA CREATION
Anesthesia: Monitor Anesthesia Care | Site: Arm Lower | Laterality: Left

## 2017-11-03 MED ORDER — MIDAZOLAM HCL 2 MG/2ML IJ SOLN
INTRAMUSCULAR | Status: AC
  Filled 2017-11-03: qty 2

## 2017-11-03 MED ORDER — LIDOCAINE-EPINEPHRINE (PF) 1 %-1:200000 IJ SOLN
INTRAMUSCULAR | Status: AC
  Filled 2017-11-03: qty 30

## 2017-11-03 MED ORDER — PROPOFOL 500 MG/50ML IV EMUL
INTRAVENOUS | Status: DC | PRN
  Administered 2017-11-03: 75 ug/kg/min via INTRAVENOUS

## 2017-11-03 MED ORDER — ONDANSETRON HCL 4 MG/2ML IJ SOLN
INTRAMUSCULAR | Status: AC
  Filled 2017-11-03: qty 2

## 2017-11-03 MED ORDER — LIDOCAINE HCL (PF) 1 % IJ SOLN
INTRAMUSCULAR | Status: AC
  Filled 2017-11-03: qty 30

## 2017-11-03 MED ORDER — CHLORHEXIDINE GLUCONATE 4 % EX LIQD: 60.0000 mL | Freq: Once | CUTANEOUS | Status: DC

## 2017-11-03 MED ORDER — FENTANYL CITRATE (PF) 250 MCG/5ML IJ SOLN
INTRAMUSCULAR | Status: AC
  Filled 2017-11-03: qty 5

## 2017-11-03 MED ORDER — PROPOFOL 10 MG/ML IV BOLUS
INTRAVENOUS | Status: AC
  Filled 2017-11-03: qty 40

## 2017-11-03 MED ORDER — SODIUM CHLORIDE 0.9 % IV SOLN
INTRAVENOUS | Status: DC | PRN
  Administered 2017-11-03: 09:00:00 500 mL

## 2017-11-03 MED ORDER — 0.9 % SODIUM CHLORIDE (POUR BTL) OPTIME
TOPICAL | Status: DC | PRN
  Administered 2017-11-03: 1000 mL

## 2017-11-03 MED ORDER — SODIUM CHLORIDE 0.9 % IV SOLN
1.5000 g | INTRAVENOUS | Status: AC
  Administered 2017-11-03: 1.5 g via INTRAVENOUS
  Filled 2017-11-03: qty 1.5

## 2017-11-03 MED ORDER — MIDAZOLAM HCL 5 MG/5ML IJ SOLN
INTRAMUSCULAR | Status: DC | PRN
  Administered 2017-11-03: 2 mg via INTRAVENOUS

## 2017-11-03 MED ORDER — HYDROCODONE-ACETAMINOPHEN 5-325 MG PO TABS: 1.0000 | ORAL_TABLET | Freq: Three times a day (TID) | ORAL | 0 refills | Status: DC | PRN

## 2017-11-03 MED ORDER — PROPOFOL 10 MG/ML IV BOLUS
INTRAVENOUS | Status: DC | PRN
  Administered 2017-11-03: 20 mg via INTRAVENOUS

## 2017-11-03 MED ORDER — GLYCOPYRROLATE 0.2 MG/ML IJ SOLN
INTRAMUSCULAR | Status: DC | PRN
  Administered 2017-11-03 (×2): 0.2 mg via INTRAVENOUS

## 2017-11-03 MED ORDER — HEPARIN SODIUM (PORCINE) 1000 UNIT/ML IJ SOLN
INTRAMUSCULAR | Status: DC | PRN
  Administered 2017-11-03: 3000 [IU] via INTRAVENOUS

## 2017-11-03 MED ORDER — LIDOCAINE HCL (CARDIAC) 20 MG/ML IV SOLN
INTRAVENOUS | Status: DC | PRN
  Administered 2017-11-03: 80 mg via INTRAVENOUS

## 2017-11-03 MED ORDER — EPHEDRINE SULFATE-NACL 50-0.9 MG/10ML-% IV SOSY
PREFILLED_SYRINGE | INTRAVENOUS | Status: DC | PRN
  Administered 2017-11-03 (×2): 5 mg via INTRAVENOUS

## 2017-11-03 MED ORDER — SODIUM CHLORIDE 0.9 % IV SOLN
INTRAVENOUS | Status: DC
  Administered 2017-11-03 (×2): via INTRAVENOUS

## 2017-11-03 MED ORDER — ONDANSETRON HCL 4 MG/2ML IJ SOLN
INTRAMUSCULAR | Status: DC | PRN
  Administered 2017-11-03: 4 mg via INTRAVENOUS

## 2017-11-03 MED ORDER — PROTAMINE SULFATE 10 MG/ML IV SOLN
INTRAVENOUS | Status: DC | PRN
  Administered 2017-11-03: 25 mg via INTRAVENOUS

## 2017-11-03 MED ORDER — FENTANYL CITRATE (PF) 250 MCG/5ML IJ SOLN
INTRAMUSCULAR | Status: DC | PRN
  Administered 2017-11-03: 100 ug via INTRAVENOUS

## 2017-11-03 MED ORDER — LIDOCAINE-EPINEPHRINE (PF) 1 %-1:200000 IJ SOLN
INTRAMUSCULAR | Status: DC | PRN
Start: 2017-11-03 — End: 2017-11-03
  Administered 2017-11-03: 7 mL

## 2017-11-03 SURGICAL SUPPLY — 30 items
ARMBAND PINK RESTRICT EXTREMIT (MISCELLANEOUS) ×3 IMPLANT
CANISTER SUCT 3000ML PPV (MISCELLANEOUS) ×3 IMPLANT
CLIP VESOCCLUDE MED 6/CT (CLIP) ×3 IMPLANT
CLIP VESOCCLUDE SM WIDE 6/CT (CLIP) ×3 IMPLANT
COVER PROBE W GEL 5X96 (DRAPES) ×3 IMPLANT
COVER TRANSDUCER ULTRASND GEL (DRAPE) ×3 IMPLANT
DERMABOND ADVANCED (GAUZE/BANDAGES/DRESSINGS) ×2
DERMABOND ADVANCED .7 DNX12 (GAUZE/BANDAGES/DRESSINGS) ×1 IMPLANT
ELECT REM PT RETURN 9FT ADLT (ELECTROSURGICAL) ×3
ELECTRODE REM PT RTRN 9FT ADLT (ELECTROSURGICAL) ×1 IMPLANT
GLOVE BIOGEL PI IND STRL 7.5 (GLOVE) ×1 IMPLANT
GLOVE BIOGEL PI INDICATOR 7.5 (GLOVE) ×2
GLOVE SURG SS PI 7.5 STRL IVOR (GLOVE) ×3 IMPLANT
GOWN STRL REUS W/ TWL LRG LVL3 (GOWN DISPOSABLE) ×2 IMPLANT
GOWN STRL REUS W/ TWL XL LVL3 (GOWN DISPOSABLE) ×1 IMPLANT
GOWN STRL REUS W/TWL LRG LVL3 (GOWN DISPOSABLE) ×4
GOWN STRL REUS W/TWL XL LVL3 (GOWN DISPOSABLE) ×2
HEMOSTAT SNOW SURGICEL 2X4 (HEMOSTASIS) IMPLANT
KIT BASIN OR (CUSTOM PROCEDURE TRAY) ×3 IMPLANT
KIT ROOM TURNOVER OR (KITS) ×3 IMPLANT
NS IRRIG 1000ML POUR BTL (IV SOLUTION) ×3 IMPLANT
PACK CV ACCESS (CUSTOM PROCEDURE TRAY) ×3 IMPLANT
PAD ARMBOARD 7.5X6 YLW CONV (MISCELLANEOUS) ×6 IMPLANT
SUT PROLENE 6 0 CC (SUTURE) ×3 IMPLANT
SUT VIC AB 3-0 SH 27 (SUTURE) ×2
SUT VIC AB 3-0 SH 27X BRD (SUTURE) ×1 IMPLANT
SUT VICRYL 4-0 PS2 18IN ABS (SUTURE) ×3 IMPLANT
TOWEL GREEN STERILE (TOWEL DISPOSABLE) ×3 IMPLANT
UNDERPAD 30X30 (UNDERPADS AND DIAPERS) ×3 IMPLANT
WATER STERILE IRR 1000ML POUR (IV SOLUTION) ×3 IMPLANT

## 2017-11-03 NOTE — Interval H&P Note (Signed)
History and Physical Interval Note:  11/03/2017 8:06 AM  [name]  has presented today for surgery, with the diagnosis of CHRONIC KIDNEY DISEASE STAGE V FOR HEMODIALYSIS ACCESS  The various methods of treatment have been discussed with the patient and family. After consideration of risks, benefits and other options for treatment, the patient has consented to  Procedure(s): ARTERIOVENOUS (AV) FISTULA CREATION LEFT ARM (Left) as a surgical intervention .  The patient's history has been reviewed, patient examined, no change in status, stable for surgery.  I have reviewed the patient's chart and labs.  Questions were answered to the patient's satisfaction.     [name]

## 2017-11-03 NOTE — Op Note (Signed)
    Patient name: [name] MRN: [id number] DOB: [birth year] Sex: female  11/03/2017 Pre-operative Diagnosis: CKD 4 Post-operative diagnosis:  Same Surgeon:  [name] Assistants:  Leontine Locket Procedure:   Left brachiocephalic fistula Anesthesia:  [facility] Blood Loss: Minimal Specimens: None  Findings: Excellent appearing vein, measuring 4 mm.  There was some thickening in the distal part of the vein which was a part of the anastomosis.  The artery was also healthy about 3 mm.  Indications: The patient is not yet on dialysis.  She comes in today for fistula creation.  Procedure:  The patient was identified in the holding area and taken to [place] 12  The patient was then placed supine on the table. MAC anesthesia was administered.  The patient was prepped and draped in the usual sterile fashion.  A time out was called and antibiotics were administered.  Ultrasound was used to evaluate the cephalic vein in the left arm.  It appeared to be healthy down to the mid forearm.  Therefore elected to proceed with a brachiocephalic fistula.  1% lidocaine was utilized for local anesthesia.  A transverse incision was made just proximal to the antecubital crease.  I first dissected out the cephalic vein.  This appeared to be very healthy measuring about 3-4 mm throughout.  It was fully mobilized.  It was marked for orientation.  Next I dissected out the brachial artery.  This is a 3 mm, disease-free artery.  Once I had adequate exposure, the patient was given 3000 units of heparin.  I then ligated the vein distally with a 3-0 silk tie.  The vein distended nicely with heparin saline.  Next, I occluded the brachial artery with vascular clamps.  A #11 blade was used to make an arteriotomy which was extended longitudinally with Potts scissors.  The vein was cut the appropriate length.  Running anastomosis was created with 6-0 Prolene.  There was a thickening and a portion of the vein at the level of the  anastomosis likely from a thickened valve or prior vena punctures.  I did not feel that this compromised the lumen of the vein.  At this point, the appropriate flushing maneuvers were performed.  The anastomosis was completed.  The patient had an excellent thrill within the fistula throughout the arm.  There was a brisk Doppler signal in the radial artery as well as a faintly palpable pulse.  25 mg of protamine was then administered.  Once hemostasis was satisfactory, the incision was closed with 2 layers of Vicryl followed by Dermabond.  There were no immediate complications.   Disposition: To PACU stable   V. [name], M.D. [facility] [place] Office: [phone] Pager:  [phone]

## 2017-11-03 NOTE — Telephone Encounter (Signed)
Sched lab 12/09/17 at 4:00 and MD 12/12/17 at 10:45. Spoke to pt to inform them of appts.

## 2017-11-03 NOTE — Anesthesia Procedure Notes (Signed)
Procedure Name: MAC Date/Time: 11/03/2017 9:25 AM Performed by: Teressa Lower., CRNA Pre-anesthesia Checklist: Patient identified, Emergency Drugs available, Suction available, Patient being monitored and Timeout performed Patient Re-evaluated:Patient Re-evaluated prior to induction Oxygen Delivery Method: Simple face mask Ventilation: Oral airway inserted - appropriate to patient size

## 2017-11-03 NOTE — Transfer of Care (Signed)
Immediate Anesthesia Transfer of Care Note  Patient: [name]  Procedure(s) Performed: ARTERIOVENOUS (AV) FISTULA CREATION LEFT ARM (Left Arm Lower)  Patient Location: PACU  Anesthesia Type:MAC  Level of Consciousness: awake, alert  and oriented  Airway & Oxygen Therapy: Patient Spontanous Breathing and Patient connected to nasal cannula oxygen  Post-op Assessment: Report given to RN and Post -op Vital signs reviewed and stable  Post vital signs: Reviewed and stable  Last Vitals:  Vitals:   11/03/17 0800 11/03/17 0826  BP: (!) 196/49 (!) 189/55  Pulse: 62 (!) 57  Resp: 18 16  Temp: 36.9 C   SpO2: 100% 98%    Last Pain:  Vitals:   11/03/17 0800  TempSrc: Oral      Patients Stated Pain Goal: 3 (40/98/11 9147)  Complications: No apparent anesthesia complications

## 2017-11-03 NOTE — Telephone Encounter (Signed)
-----   Message from Mena Goes, RN sent at 11/03/2017 11:19 AM EST ----- Regarding: 4-6 weeks w/ duplex   ----- Message ----- From: Gabriel Earing, PA-C Sent: 11/03/2017   9:57 AM To: [facility] Charge Pool  S/p left BC AVF 11/03/17. F/u with Dr. Trula Slade in 4-6 weeks with duplex.  Thanks

## 2017-11-03 NOTE — Discharge Instructions (Signed)
° °  [facility]  Discharge Instructions  AV Fistula or Graft Surgery for Dialysis Access  Please refer to the following instructions for your post-procedure care. Your surgeon or physician assistant will discuss any changes with you.  Activity  You may drive the day following your surgery, if you are comfortable and no longer taking prescription pain medication. Resume full activity as the soreness in your incision resolves.  Bathing/Showering  You may shower after you go home. Keep your incision dry for 48 hours. Do not soak in a bathtub, hot tub, or swim until the incision heals completely. You may not shower if you have a hemodialysis catheter.  Incision Care  Clean your incision with mild soap and water after 48 hours. Pat the area dry with a clean towel. You do not need a bandage unless otherwise instructed. Do not apply any ointments or creams to your incision. You may have skin glue on your incision. Do not peel it off. It will come off on its own in about one week. Your arm may swell a bit after surgery. To reduce swelling use pillows to elevate your arm so it is above your heart. Your doctor will tell you if you need to lightly wrap your arm with an ACE bandage.  Diet  Resume your normal diet. There are not special food restrictions following this procedure. In order to heal from your surgery, it is CRITICAL to get adequate nutrition. Your body requires vitamins, minerals, and protein. Vegetables are the best source of vitamins and minerals. Vegetables also provide the perfect balance of protein. Processed food has little nutritional value, so try to avoid this.  Medications  Resume taking all of your medications. If your incision is causing pain, you may take over-the counter pain relievers such as acetaminophen (Tylenol). If you were prescribed a stronger pain medication, please be aware these medications can cause nausea and constipation. Prevent  nausea by taking the medication with a snack or meal. Avoid constipation by drinking plenty of fluids and eating foods with high amount of fiber, such as fruits, vegetables, and grains.  Do not take Tylenol if you are taking prescription pain medications.  Follow up Your surgeon may want to see you in the office following your access surgery. If so, this will be arranged at the time of your surgery.  Please call us immediately for any of the following conditions:  Increased pain, redness, drainage (pus) from your incision site Fever of 101 degrees or higher Severe or worsening pain at your incision site Hand pain or numbness.  Reduce your risk of vascular disease:  Stop smoking. If you would like help, call [facility] at 1-800-QUIT-NOW 858-682-4606) or [place] at [place] your cholesterol Maintain a desired weight Control your diabetes Keep your blood pressure down  Dialysis  It will take several weeks to several months for your new dialysis access to be ready for use. Your surgeon will determine when it is okay to use it. Your nephrologist will continue to direct your dialysis. You can continue to use your Permcath until your new access is ready for use.   11/03/2017 [name] [id number] [birth year]  Surgeon(s): Serafina Mitchell, MD  Procedure(s): Creation of left brachial cephalic AV fistula  x Do not stick fistula for 12 weeks    If you have any questions, please call the office at [phone].

## 2017-11-03 NOTE — Anesthesia Postprocedure Evaluation (Signed)
Anesthesia Post Note  Patient: [name]  Procedure(s) Performed: ARTERIOVENOUS (AV) FISTULA CREATION LEFT ARM (Left Arm Lower)     Patient location during evaluation: PACU Anesthesia Type: MAC Level of consciousness: awake and alert Pain management: pain level controlled Vital Signs Assessment: post-procedure vital signs reviewed and stable Respiratory status: spontaneous breathing, nonlabored ventilation and respiratory function stable Cardiovascular status: stable and blood pressure returned to baseline Postop Assessment: no apparent nausea or vomiting Anesthetic complications: no    Last Vitals:  Vitals:   11/03/17 1015 11/03/17 1032  BP: (!) 153/51 (!) 166/50  Pulse: (!) 58 (!) 58  Resp: 12 14  Temp: 36.7 C 36.7 C  SpO2: 97% 99%    Last Pain:  Vitals:   11/03/17 1015  TempSrc:   PainSc: 0-No pain                 Shadoe Bethel,W. EDMOND

## 2017-11-04 ENCOUNTER — Encounter (HOSPITAL_COMMUNITY): Payer: Self-pay | Admitting: Surgery

## 2017-11-04 ENCOUNTER — Other Ambulatory Visit: Payer: Self-pay

## 2017-11-04 DIAGNOSIS — Z992 Dependence on renal dialysis: Principal | ICD-10-CM

## 2017-11-04 DIAGNOSIS — Z48812 Encounter for surgical aftercare following surgery on the circulatory system: Secondary | ICD-10-CM

## 2017-11-04 DIAGNOSIS — N186 End stage renal disease: Secondary | ICD-10-CM

## 2017-12-09 ENCOUNTER — Ambulatory Visit (HOSPITAL_COMMUNITY)
Admission: RE | Admit: 2017-12-09 | Discharge: 2017-12-09 | Disposition: A | Discharge status: Home / Self Care | Payer: Medicare Other | Source: Ambulatory Visit | Attending: Surgery | Admitting: Surgery

## 2017-12-09 DIAGNOSIS — Z992 Dependence on renal dialysis: Secondary | ICD-10-CM | POA: Insufficient documentation

## 2017-12-09 DIAGNOSIS — Z48812 Encounter for surgical aftercare following surgery on the circulatory system: Secondary | ICD-10-CM | POA: Diagnosis not present

## 2017-12-09 DIAGNOSIS — N186 End stage renal disease: Secondary | ICD-10-CM | POA: Diagnosis not present

## 2017-12-12 ENCOUNTER — Encounter: Payer: Self-pay | Admitting: Surgery

## 2017-12-12 ENCOUNTER — Ambulatory Visit (INDEPENDENT_AMBULATORY_CARE_PROVIDER_SITE_OTHER): Payer: Self-pay | Admitting: Surgery

## 2017-12-12 VITALS — BP 118/63 | HR 50 | Temp 98.3°F | Resp 18 | Ht 65.0 in | Wt 144.1 lb

## 2017-12-12 DIAGNOSIS — N184 Chronic kidney disease, stage 4 (severe): Secondary | ICD-10-CM

## 2017-12-12 NOTE — Progress Notes (Signed)
Patient name: [name] MRN: [id number] DOB: [birth year] Sex: female  REASON FOR VISIT:     post op  HISTORY OF PRESENT ILLNESS:   [name] is a 67 y.o. female who returns today for follow-up.  On 11/03/2017 she underwent a left brachiocephalic fistula for stage IV CKD.  She reports no symptoms of steal.  She does have some tenderness at the incision.  CURRENT MEDICATIONS:    Current Outpatient Medications  Medication Sig Dispense Refill  . albuterol (PROVENTIL HFA;VENTOLIN HFA) 108 (90 Base) MCG/ACT inhaler Inhale 2 puffs into the lungs every 4 (four) hours as needed for wheezing or shortness of breath. ProAir 1 each 0  . allopurinol (ZYLOPRIM) 100 MG tablet Take 100 mg by mouth daily.  2  . ALPRAZolam (XANAX) 0.5 MG tablet Take 0.25 mg by mouth at bedtime as needed for anxiety.     Marland Kitchen amLODipine (NORVASC) 10 MG tablet Take 10 mg by mouth daily.      . carvedilol (COREG) 25 MG tablet TAKE 1 TABLET BY MOUTH 2 TIMES A DAY WITH A MEAL (Patient taking differently: TAKE 25 MG BY MOUTH 2 TIMES A DAY WITH A MEAL) 60 tablet 9  . Cholecalciferol (VITAMIN D3) 5000 units TABS Take 5,000 Units by mouth daily.    . cloNIDine (CATAPRES) 0.2 MG tablet Take 1 tablet (0.2 mg total) by mouth 3 (three) times daily. 90 tablet 11  . clopidogrel (PLAVIX) 75 MG tablet Take 1 tablet (75 mg total) by mouth daily with breakfast. 90 tablet 3  . DULoxetine (CYMBALTA) 30 MG capsule Take 30 mg by mouth daily.    . famotidine (PEPCID) 20 MG tablet TAKE 1 TABLET (20 MG TOTAL) BY MOUTH DAILY BEFORE SUPPER. 30 tablet 2  . fenofibrate 160 MG tablet Take 1 tablet (160 mg total) by mouth daily. 30 tablet 1  . ferrous sulfate 325 (65 FE) MG tablet Take 325 mg by mouth 2 (two) times daily with a meal.    . furosemide (LASIX) 40 MG tablet Take 40 mg by mouth daily. May take an additional tablet day as needed for weight gain   6  . glipiZIDE (GLUCOTROL XL) 2.5 MG 24 hr tablet Take 2.5  mg by mouth daily.  11  . hydrALAZINE (APRESOLINE) 100 MG tablet Take 100 mg by mouth 3 (three) times daily.    Marland Kitchen HYDROcodone-acetaminophen (NORCO/VICODIN) 5-325 MG tablet Take 1 tablet by mouth every 8 (eight) hours as needed for moderate pain. 8 tablet 0  . isosorbide mononitrate (IMDUR) 30 MG 24 hr tablet Take 0.5 tablets (15 mg total) by mouth daily. 45 tablet 3  . magic mouthwash SOLN Take 15 mLs by mouth 4 (four) times daily.    . Omega-3 Fatty Acids (FISH OIL) 1000 MG CAPS Take 1,000 mg by mouth 2 (two) times daily.    . pantoprazole (PROTONIX) 40 MG tablet Take 1 tablet (40 mg total) by mouth daily. 30 tablet 11  . Potassium 99 MG TABS Take 99 mg by mouth daily.     Marland Kitchen zolpidem (AMBIEN) 10 MG tablet Take 10 mg by mouth at bedtime.    Marland Kitchen atorvastatin (LIPITOR) 40 MG tablet Take 40 mg by mouth daily.     No current facility-administered medications for this visit.     REVIEW OF SYSTEMS:   [X]  denotes positive finding, [ ]  denotes negative finding Cardiac  Comments:  Chest pain or chest pressure:    Shortness of breath upon exertion:  Short of breath when lying flat:    Irregular heart rhythm:    Constitutional    Fever or chills:      PHYSICAL EXAM:   Vitals:   12/12/17 1111  BP: 118/63  Pulse: (!) 50  Resp: 18  Temp: 98.3 F (36.8 C)  TempSrc: Oral  SpO2: 96%  Weight: 144 lb 1.6 oz (65.4 kg)  Height: 5\' 5"  (1.651 m)    GENERAL: The patient is a well-nourished female, in no acute distress. The vital signs are documented above. CARDIOVASCULAR: There is a regular rate and rhythm. PULMONARY: Non-labored respirations Palpable thrill within fistula however it is somewhat difficult to feel.  STUDIES:   Ultrasound shows that the fistula measures approximately 5 mm and is not too deep.  There is a retained valve as well as a 0.2 centimeter branch   MEDICAL ISSUES:   CKD: The patient's fistula is slightly difficult to palpate however ultrasound suggested it is  maturing.  She more than likely is going to need a fistulogram to evaluate this possible stenotic valve however, because she is not yet on dialysis I would like to observe this for another 8 weeks and repeat her ultrasound before making a recommendation for intervention Patient has appointment in August with a noncontrast CT scan to follow-up for her abdominal aneurysm as well as a carotid duplex to follow-up her carotid disease.  [name], MD [facility] [phone] Pager [phone]

## 2017-12-13 ENCOUNTER — Other Ambulatory Visit: Payer: Self-pay

## 2017-12-13 DIAGNOSIS — Z992 Dependence on renal dialysis: Principal | ICD-10-CM

## 2017-12-13 DIAGNOSIS — N186 End stage renal disease: Secondary | ICD-10-CM

## 2018-01-14 ENCOUNTER — Emergency Department (HOSPITAL_COMMUNITY)
Admission: EM | Admit: 2018-01-14 | Discharge: 2018-01-14 | Disposition: A | Discharge status: Home / Self Care | Payer: Medicare Other | Source: Home / Self Care | Attending: Emergency Medicine | Admitting: Emergency Medicine

## 2018-01-14 ENCOUNTER — Emergency Department (HOSPITAL_COMMUNITY): Payer: Medicare Other

## 2018-01-14 ENCOUNTER — Other Ambulatory Visit: Payer: Self-pay

## 2018-01-14 ENCOUNTER — Encounter (HOSPITAL_COMMUNITY): Payer: Self-pay

## 2018-01-14 DIAGNOSIS — Z7902 Long term (current) use of antithrombotics/antiplatelets: Secondary | ICD-10-CM

## 2018-01-14 DIAGNOSIS — I5032 Chronic diastolic (congestive) heart failure: Secondary | ICD-10-CM

## 2018-01-14 DIAGNOSIS — I2510 Atherosclerotic heart disease of native coronary artery without angina pectoris: Secondary | ICD-10-CM | POA: Insufficient documentation

## 2018-01-14 DIAGNOSIS — Z951 Presence of aortocoronary bypass graft: Secondary | ICD-10-CM | POA: Insufficient documentation

## 2018-01-14 DIAGNOSIS — F17210 Nicotine dependence, cigarettes, uncomplicated: Secondary | ICD-10-CM | POA: Insufficient documentation

## 2018-01-14 DIAGNOSIS — N186 End stage renal disease: Secondary | ICD-10-CM | POA: Insufficient documentation

## 2018-01-14 DIAGNOSIS — E1122 Type 2 diabetes mellitus with diabetic chronic kidney disease: Secondary | ICD-10-CM | POA: Insufficient documentation

## 2018-01-14 DIAGNOSIS — Z79899 Other long term (current) drug therapy: Secondary | ICD-10-CM | POA: Insufficient documentation

## 2018-01-14 DIAGNOSIS — J449 Chronic obstructive pulmonary disease, unspecified: Secondary | ICD-10-CM

## 2018-01-14 DIAGNOSIS — I714 Abdominal aortic aneurysm, without rupture, unspecified: Secondary | ICD-10-CM

## 2018-01-14 DIAGNOSIS — R1032 Left lower quadrant pain: Secondary | ICD-10-CM

## 2018-01-14 DIAGNOSIS — R05 Cough: Secondary | ICD-10-CM | POA: Insufficient documentation

## 2018-01-14 DIAGNOSIS — I132 Hypertensive heart and chronic kidney disease with heart failure and with stage 5 chronic kidney disease, or end stage renal disease: Secondary | ICD-10-CM

## 2018-01-14 DIAGNOSIS — Z7984 Long term (current) use of oral hypoglycemic drugs: Secondary | ICD-10-CM

## 2018-01-14 DIAGNOSIS — Z955 Presence of coronary angioplasty implant and graft: Secondary | ICD-10-CM | POA: Insufficient documentation

## 2018-01-14 LAB — CBC
HEMATOCRIT: 27.3 % — AB (ref 36.0–46.0)
HEMOGLOBIN: 8.7 g/dL — AB (ref 12.0–15.0)
MCH: 31 pg (ref 26.0–34.0)
MCHC: 31.9 g/dL (ref 30.0–36.0)
MCV: 97.2 fL (ref 78.0–100.0)
Platelets: 268 10*3/uL (ref 150–400)
RBC: 2.81 MIL/uL — ABNORMAL LOW (ref 3.87–5.11)
RDW: 14.2 % (ref 11.5–15.5)
WBC: 9.3 10*3/uL (ref 4.0–10.5)

## 2018-01-14 LAB — BASIC METABOLIC PANEL
ANION GAP: 12 (ref 5–15)
BUN: 21 mg/dL — ABNORMAL HIGH (ref 6–20)
CALCIUM: 8.6 mg/dL — AB (ref 8.9–10.3)
CO2: 27 mmol/L (ref 22–32)
Chloride: 101 mmol/L (ref 101–111)
Creatinine, Ser: 2.17 mg/dL — ABNORMAL HIGH (ref 0.44–1.00)
GFR, EST AFRICAN AMERICAN: 26 mL/min — AB (ref >60)
GFR, EST NON AFRICAN AMERICAN: 23 mL/min — AB (ref >60)
Glucose, Bld: 153 mg/dL — ABNORMAL HIGH (ref 65–99)
Potassium: 3.4 mmol/L — ABNORMAL LOW (ref 3.5–5.1)
Sodium: 140 mmol/L (ref 135–145)

## 2018-01-14 LAB — URINALYSIS, ROUTINE W REFLEX MICROSCOPIC
Bilirubin Urine: NEGATIVE
Glucose, UA: NEGATIVE mg/dL
Hgb urine dipstick: NEGATIVE
KETONES UR: NEGATIVE mg/dL
LEUKOCYTES UA: NEGATIVE
NITRITE: NEGATIVE
PH: 5 (ref 5.0–8.0)
PROTEIN: NEGATIVE mg/dL
Specific Gravity, Urine: 1.012 (ref 1.005–1.030)

## 2018-01-14 MED ORDER — BARIUM SULFATE 2 % PO SUSP
450.0000 mL | Freq: Once | ORAL | Status: AC
  Administered 2018-01-14: 450 mL via ORAL

## 2018-01-14 NOTE — ED Provider Notes (Signed)
Patient sent to me by Dr. Alvino Chapel.  Abdominal CT shows slight increase of her aneurysm to 5.4 cm is from 4.8 cm.  Will instruct patient follow-up with her physician for this.   Lacretia Leigh, MD 01/14/18 [phone]

## 2018-01-14 NOTE — ED Triage Notes (Signed)
Pt states sick x 1 month. Given azithromycin and prednisone. Finished course. Has been achy all over. Left sided stomach and back pain. Pt recently had fistula inserted, as she will be starting dialysis soon.

## 2018-01-14 NOTE — ED Provider Notes (Addendum)
[facility] DEPT Provider Note   CSN: [id number] Arrival date & time: 01/14/18  1012     History   Chief Complaint Chief Complaint  Patient presents with  . Flank Pain    HPI [name] is a 67 y.o. female.  HPI Patient presents with left-sided abdominal pain.  She states that she been sick over the last month.  States that started with respiratory type symptoms and was given azithromycin and steroids by her primary care doctor.  Briefly felt better for a few days but now is feeling worse again.  Cough however is improved.  But now has dull abdominal pain that is primarily on the lower left abdomen but does go to the right at times.  No diarrhea.  No constipation.  Has known history of abdominal aortic aneurysm.  Had ultrasound done a couple months ago and was stable.  No fevers.  No diarrhea or constipation.  Recently had fistula placed on left arm for renal insufficiency.  Plan is eventually to start dialysis when she needs it. Past Medical History:  Diagnosis Date  . AAA (abdominal aortic aneurysm) ([place]) 5/09   3.7x3.3 by u/s 2009  . Abnormal EKG    deep TW inversions chronic  . Anemia   . CAD (coronary artery disease)    a. CABG 03/2011 LIMA to LAD, SVG to OM, SVG to PDA. b. cath 06/25/2015 DES to SVG to rPDA, patent LIMA to LAD, patent SVG to OM  . carotid stenosis 5/09   S/p L CEA ;  60-79% bilat ICA by preCABG dopplers 7/12  . CHF (congestive heart failure) ([place])   . Chronic back pain    "all over" (06/24/2015)  . Chronic bronchitis ([place])    "get it pretty much q yr" (06/24/2015)  . CKD (chronic kidney disease)   . Complication of anesthesia 1966   "problem w/ether"  . COPD (chronic obstructive pulmonary disease) ([place])   . Coronary artery disease 5/09   s/p CABG 7/12   L-LAD, S-OM, S-PDA  . Depression   . GERD (gastroesophageal reflux disease)    With hiatal hernia  . GIB (gastrointestinal bleeding) 9/11   S/p EGD with cautery at  [facility]  . Heart murmur   . History of blood transfusion "a few times"   "all related to anemia" (06/24/2015)  . History of hiatal hernia   . Hyperlipidemia   . Hypertension   . Mitral regurgitation    3+ MR by intraoperative TEE;  s/p MV repair with Dr. Roxy Manns 7/12  . Myocardial infarction [facility] 2012 "several"  . On home oxygen therapy    "2 liters at night; negative for sleep apnea"  . PAD (peripheral artery disease) ([facility])    Severe; s/p bilateral renal artery stents, moderate in-stent restenosis  . Paroxysmal atrial fibrillation ([facility])    coumadin;  echo 9/07: EF 60%, mild LVH;  s/p Cox Maze 7/12 with LAA clipping  . Subclavian artery stenosis, left ([facility])    stented by Dr. Trula Slade on 10/18 to help flow of her LIMA to LAD  . Type II diabetes mellitus [facility])     Patient Active Problem List   Diagnosis Date Noted  . Elevated brain natriuretic peptide (BNP) level 10/07/2015  . Diabetes mellitus ([place]) 10/07/2015  . Pain in the chest   . PAD (peripheral artery disease) ([place]) 07/29/2015  . Chest pain 07/04/2015  . Acute on chronic kidney failure ([place]) 07/04/2015  . GERD (gastroesophageal reflux disease)  07/04/2015  . Anemia 06/26/2015  . Coronary artery disease involving autologous vein coronary bypass graft with unstable angina pectoris ([place]) 06/24/2015  . Angina at rest [facility])   . Subclavian artery stenosis, left ([place]) 06/23/2015  . AAA (abdominal aortic aneurysm) without rupture ([place]) 12/10/2014  . Subclavian steal syndrome 12/10/2014  . Renal artery stenosis ([place]) 09/04/2013  . Carotid artery stenosis 09/28/2012  . Mitral valve disorder 06/23/2011  . S/P CABG x 3 06/16/2011  . S/P Maze operation for atrial fibrillation 06/16/2011  . S/P MVR (mitral valve repair) 06/16/2011  . Follow-up examination following surgery 03/29/2011  . CKD (chronic kidney disease) 03/03/2011  . COPD (chronic obstructive pulmonary disease) ([place]) 03/03/2011  . Chronic diastolic heart failure ([place])  02/26/2011  . Hypotension 02/26/2011  . NSTEMI (non-ST elevated myocardial infarction) ([place]) 02/26/2011  . Long term (current) use of anticoagulants 12/17/2010  . OBSTRUCTIVE SLEEP APNEA 09/04/2010  . HYPOXEMIA 09/04/2010  . SNORING 08/05/2010  . Atrial fibrillation ([place]) 12/09/2009  . WEIGHT LOSS 03/28/2009  . PALPITATIONS 03/28/2009  . PVD 11/21/2008  . HYPERLIPIDEMIA-MIXED 11/20/2008  . TOBACCO ABUSE 11/20/2008  . Essential hypertension 11/20/2008  . Coronary artery disease involving native heart with angina pectoris ([place]) 11/20/2008  . ABDOMINAL AORTIC ANEURYSM 11/20/2008    Past Surgical History:  Procedure Laterality Date  . ABDOMINAL HYSTERECTOMY  1990's  . APPENDECTOMY  Aug. 11, 2016   Ruptured  . AV FISTULA PLACEMENT Left 11/03/2017   Procedure: ARTERIOVENOUS (AV) FISTULA CREATION LEFT ARM;  Surgeon: Serafina Mitchell, MD;  Location: [facility];  Service: Vascular;  Laterality: Left;  . BOWEL RESECTION  2013  . CARDIAC CATHETERIZATION N/A 06/24/2015   Procedure: Left Heart Cath and Cors/Grafts Angiography;  Surgeon: Leonie Man, MD;  Location: [facility] CV LAB;  Service: Cardiovascular;  Laterality: N/A;  . CARDIAC CATHETERIZATION N/A 06/25/2015   Procedure: Coronary Stent Intervention;  Surgeon: Sherren Mocha, MD;  Location: [facility] CV LAB;  Service: Cardiovascular;  Laterality: N/A;  . CAROTID ENDARTERECTOMY Left   . CORONARY ANGIOPLASTY    . CORONARY ARTERY BYPASS GRAFT  03/05/2011   CABG X3 (LIMA to LAD, SVG to OM, SVG to PDA, EVH via left thigh  . CORONARY STENT PLACEMENT    . LACERATION REPAIR Right 1990's   WRIST  . MAZE  03/05/2011   complete biatrial lesion set with clipping of LA appendage  . MITRAL VALVE REPAIR  03/05/2011   39mm Memo 3D ring annuloplasty for ischemic MR  . PERIPHERAL VASCULAR CATHETERIZATION N/A 06/24/2015   Procedure: Aortic Arch Angiography;  Surgeon: Serafina Mitchell, MD;  Location: [place] CV LAB;  Service: Cardiovascular;   Laterality: N/A;  . PERIPHERAL VASCULAR CATHETERIZATION  06/24/2015   Procedure: Peripheral Vascular Intervention;  Surgeon: Serafina Mitchell, MD;  Location: [place] CV LAB;  Service: Cardiovascular;;  . PERIPHERAL VASCULAR CATHETERIZATION N/A 06/24/2015   Procedure: Abdominal Aortogram;  Surgeon: Serafina Mitchell, MD;  Location: [facility] CV LAB;  Service: Cardiovascular;  Laterality: N/A;  . RENAL ANGIOGRAM Bilateral 09/11/2013   Procedure: RENAL ANGIOGRAM;  Surgeon: Serafina Mitchell, MD;  Location: [facility] CATH LAB;  Service: Cardiovascular;  Laterality: Bilateral;  . RIGHT FEMORAL-POPLITEAL BYPASS       OB History   None      Home Medications    Prior to Admission medications   Medication Sig Start Date End Date Taking? Authorizing Provider  albuterol (PROVENTIL HFA;VENTOLIN HFA) 108 (90 Base) MCG/ACT inhaler Inhale 2 puffs  into the lungs every 4 (four) hours as needed for wheezing or shortness of breath. ProAir 10/08/15  Yes Delfina Redwood, MD  allopurinol (ZYLOPRIM) 100 MG tablet Take 100 mg by mouth daily. 02/02/17  Yes [provider]  amLODipine (NORVASC) 10 MG tablet Take 10 mg by mouth daily.     Yes [provider]  atorvastatin (LIPITOR) 40 MG tablet Take 40 mg by mouth every evening.  12/07/17  Yes [provider]  carvedilol (COREG) 25 MG tablet TAKE 1 TABLET BY MOUTH 2 TIMES A DAY WITH A MEAL Patient taking differently: TAKE 25 MG BY MOUTH 2 TIMES A DAY WITH A MEAL 11/04/15  Yes Truitt Merle C, NP  Cholecalciferol (VITAMIN D3) 5000 units TABS Take 5,000 Units by mouth daily.   Yes [provider]  cloNIDine (CATAPRES) 0.2 MG tablet Take 1 tablet (0.2 mg total) by mouth 3 (three) times daily. 07/10/15  Yes Richardson Dopp T, PA-C  clopidogrel (PLAVIX) 75 MG tablet Take 1 tablet (75 mg total) by mouth daily with breakfast. 06/26/15  Yes Almyra Deforest, PA  DULoxetine (CYMBALTA) 60 MG capsule Take one capsule (60 mg dose) by mouth daily. 10/26/17  Yes  [provider]  famotidine (PEPCID) 20 MG tablet TAKE 1 TABLET (20 MG TOTAL) BY MOUTH DAILY BEFORE SUPPER. 01/27/16  Yes Sherren Mocha, MD  fenofibrate 160 MG tablet Take 1 tablet (160 mg total) by mouth daily. 10/08/15  Yes Delfina Redwood, MD  ferrous sulfate 325 (65 FE) MG tablet Take 325 mg by mouth 2 (two) times daily with a meal.   Yes [provider]  furosemide (LASIX) 40 MG tablet Take 40 mg by mouth daily. May take an additional tablet day as needed for weight gain  05/30/17  Yes [provider]  glipiZIDE (GLUCOTROL) 5 MG tablet Take one half tablet (2.5 mg dose) by mouth daily. 12/06/17  Yes [provider]  hydrALAZINE (APRESOLINE) 100 MG tablet Take 100 mg by mouth 3 (three) times daily.   Yes [provider]  HYDROcodone-acetaminophen (NORCO/VICODIN) 5-325 MG tablet Take 1 tablet by mouth every 8 (eight) hours as needed for moderate pain. 11/03/17  Yes Rhyne, Hulen Shouts, PA-C  isosorbide mononitrate (IMDUR) 30 MG 24 hr tablet Take 0.5 tablets (15 mg total) by mouth daily. 12/02/15  Yes Sherren Mocha, MD  Omega-3 Fatty Acids (FISH OIL) 1000 MG CAPS Take 1,000 mg by mouth 2 (two) times daily.   Yes [provider]  pantoprazole (PROTONIX) 40 MG tablet Take 1 tablet (40 mg total) by mouth daily. 06/26/15  Yes Almyra Deforest, PA  Potassium 99 MG TABS Take 99 mg by mouth daily.    Yes [provider]  zolpidem (AMBIEN) 10 MG tablet Take 10 mg by mouth at bedtime.   Yes [provider]  ALPRAZolam Duanne Moron) 0.5 MG tablet Take 0.25 mg by mouth at bedtime as needed for anxiety.  10/16/15   [provider]  magic mouthwash SOLN Take 15 mLs by mouth 4 (four) times daily as needed for mouth pain.  10/23/17   [provider]    Family History Family History  Problem Relation Age of Onset  . Emphysema Mother   . Heart disease Mother        before age 2  . Hypertension Mother   . Hyperlipidemia Mother   .  Heart attack Mother   . AAA (abdominal aortic aneurysm) Mother        rupture  .  Heart attack Father 32  . Emphysema Father   . Heart disease Father        before age 68  . Hyperlipidemia Father   . Hypertension Father   . Peripheral vascular disease Father   . Diabetes Brother   . Heart disease Brother        before age 36  . Hyperlipidemia Brother   . Hypertension Brother   . Heart attack Brother        CABG  . Heart disease Other        Vascular disease in grandparents, uncles and dad    Social History Social History   Tobacco Use  . Smoking status: Current Every Day Smoker    Packs/day: 1.00    Years: 50.00    Pack years: 50.00    Types: Cigarettes  . Smokeless tobacco: Never Used  Substance Use Topics  . Alcohol use: Yes    Alcohol/week: 0.0 oz    Comment: occasionally  . Drug use: Yes    Types: Marijuana     Allergies   Isosorbide; Codeine; Contrast media [iodinated diagnostic agents]; Ioxaglate; Metrizamide; and Percocet [oxycodone-acetaminophen]   Review of Systems Review of Systems  Constitutional: Positive for appetite change and fatigue. Negative for fever.  HENT: Negative for congestion.   Respiratory: Positive for cough.   Gastrointestinal: Positive for abdominal pain. Negative for diarrhea, nausea and vomiting.  Endocrine: Negative for polyuria.  Genitourinary: Negative for flank pain.  Musculoskeletal: Positive for back pain.  Skin: Negative for rash.  Neurological: Negative for syncope.  Hematological: Negative for adenopathy.  Psychiatric/Behavioral: Negative for confusion.     Physical Exam Updated Vital Signs BP (!) 158/57 (BP Location: Right Arm)   Pulse (!) 55   Temp 97.8 F (36.6 C) (Oral)   Resp 16   Ht 5\' 5"  (1.651 m)   Wt 65.3 kg (144 lb)   SpO2 96%   BMI 23.96 kg/m   Physical Exam  Constitutional: She appears well-developed.  HENT:  Head: Atraumatic.  Eyes: Pupils are equal, round, and reactive to light.  Neck:  Neck supple.  Cardiovascular: Normal rate.  Pulmonary/Chest: Effort normal.  Mildly harsh breath sounds, may be slightly more pronounced in left base.  Abdominal: There is tenderness.  Left lower quadrant tenderness without rebound or guarding.  No hernia palpated.  Musculoskeletal: She exhibits no edema.  Dialysis graft to left upper extremity.  Neurological: She is alert.  Skin: Skin is warm. Capillary refill takes less than 2 seconds.  Psychiatric: She has a normal mood and affect.     ED Treatments / Results  Labs (all labs ordered are listed, but only abnormal results are displayed) Labs Reviewed  BASIC METABOLIC PANEL - Abnormal; Notable for the following components:      Result Value   Potassium 3.4 (*)    Glucose, Bld 153 (*)    BUN 21 (*)    Creatinine, Ser 2.17 (*)    Calcium 8.6 (*)    GFR calc non Af Amer 23 (*)    GFR calc Af Amer 26 (*)    All other components within normal limits  CBC - Abnormal; Notable for the following components:   RBC 2.81 (*)    Hemoglobin 8.7 (*)    HCT 27.3 (*)    All other components within normal limits  URINE CULTURE  URINALYSIS, ROUTINE W REFLEX MICROSCOPIC    EKG None  Radiology Dg Chest 2 View  Result Date: 01/14/2018  CLINICAL DATA:  Shortness of breath, cough and congestion. History of bronchitis, hypertension, CAD/post CABG, CHF, diabetes and smoking EXAM: CHEST - 2 VIEW COMPARISON:  10/06/2015; 07/04/2015; 03/21/2013 FINDINGS: Grossly unchanged cardiac silhouette and mediastinal contours post median sternotomy and CABG. Atherosclerotic plaque within the thoracic aorta. Lungs remain hyperexpanded with flattening of the diaphragms mild diffuse slightly nodular thickening of the pulmonary interstitium. There is minimal pleuroparenchymal thickening about the bilateral major fissures. No evidence of edema. No pleural effusion or pneumothorax. No acute osseus abnormalities. IMPRESSION: Similar findings of lung hyperexpansion and  chronic bronchitic change without superimposed acute cardiopulmonary disease. Electronically Signed   By: Sandi Mariscal M.D.   On: 01/14/2018 14:28    Procedures Procedures (including critical care time)  Medications Ordered in ED Medications  barium (READI-CAT 2) 2 % suspension 450 mL (450 mLs Oral Given 01/14/18 1423)     Initial Impression / Assessment and Plan / ED Course  I have reviewed the triage vital signs and the nursing notes.  Pertinent labs & imaging results that were available during my care of the patient were reviewed by me and considered in my medical decision making (see chart for details).     Patient with abdominal pain.  Has had for the last couple weeks but preceded by a viral respiratory type symptoms.  Lab work overall reassuring.  Chest x-ray done and reassuring.  Will get noncontrast CT scan due to renal insufficiency. Known AAA recent diameter of 4.8 cm. Care will be turned over to Dr. Zenia Resides.  Final Clinical Impressions(s) / ED Diagnoses   Final diagnoses:  Left lower quadrant pain    ED Discharge Orders    None       Davonna Belling, MD 01/14/18 [place]    Davonna Belling, MD 01/14/18 [phone]

## 2018-01-14 NOTE — Discharge Instructions (Addendum)
Your aneurysm today has increased to 5.4 cm from 4.8 cm.  You need to have a repeat ultrasound within 1 year.  Follow-up with your doctor for this.

## 2018-01-15 LAB — URINE CULTURE: CULTURE: NO GROWTH

## 2018-01-16 ENCOUNTER — Inpatient Hospital Stay (HOSPITAL_COMMUNITY)
Admission: EM | Admit: 2018-01-16 | Discharge: 2018-01-27 | DRG: 268 | Disposition: A | Discharge status: Home Health | Payer: Medicare Other | Attending: Surgery | Admitting: Surgery

## 2018-01-16 ENCOUNTER — Encounter (HOSPITAL_COMMUNITY): Payer: Self-pay

## 2018-01-16 ENCOUNTER — Other Ambulatory Visit: Payer: Self-pay

## 2018-01-16 DIAGNOSIS — Z79899 Other long term (current) drug therapy: Secondary | ICD-10-CM

## 2018-01-16 DIAGNOSIS — E119 Type 2 diabetes mellitus without complications: Secondary | ICD-10-CM

## 2018-01-16 DIAGNOSIS — N179 Acute kidney failure, unspecified: Secondary | ICD-10-CM | POA: Diagnosis not present

## 2018-01-16 DIAGNOSIS — N184 Chronic kidney disease, stage 4 (severe): Secondary | ICD-10-CM | POA: Diagnosis present

## 2018-01-16 DIAGNOSIS — Z90710 Acquired absence of both cervix and uterus: Secondary | ICD-10-CM

## 2018-01-16 DIAGNOSIS — E1151 Type 2 diabetes mellitus with diabetic peripheral angiopathy without gangrene: Secondary | ICD-10-CM | POA: Diagnosis present

## 2018-01-16 DIAGNOSIS — R34 Anuria and oliguria: Secondary | ICD-10-CM | POA: Diagnosis not present

## 2018-01-16 DIAGNOSIS — R0902 Hypoxemia: Secondary | ICD-10-CM

## 2018-01-16 DIAGNOSIS — I745 Embolism and thrombosis of iliac artery: Secondary | ICD-10-CM | POA: Diagnosis present

## 2018-01-16 DIAGNOSIS — I252 Old myocardial infarction: Secondary | ICD-10-CM

## 2018-01-16 DIAGNOSIS — Z952 Presence of prosthetic heart valve: Secondary | ICD-10-CM

## 2018-01-16 DIAGNOSIS — I701 Atherosclerosis of renal artery: Secondary | ICD-10-CM | POA: Diagnosis present

## 2018-01-16 DIAGNOSIS — Z951 Presence of aortocoronary bypass graft: Secondary | ICD-10-CM

## 2018-01-16 DIAGNOSIS — Z91041 Radiographic dye allergy status: Secondary | ICD-10-CM

## 2018-01-16 DIAGNOSIS — Z885 Allergy status to narcotic agent status: Secondary | ICD-10-CM

## 2018-01-16 DIAGNOSIS — Z833 Family history of diabetes mellitus: Secondary | ICD-10-CM

## 2018-01-16 DIAGNOSIS — I2510 Atherosclerotic heart disease of native coronary artery without angina pectoris: Secondary | ICD-10-CM | POA: Diagnosis present

## 2018-01-16 DIAGNOSIS — F329 Major depressive disorder, single episode, unspecified: Secondary | ICD-10-CM | POA: Diagnosis present

## 2018-01-16 DIAGNOSIS — E7800 Pure hypercholesterolemia, unspecified: Secondary | ICD-10-CM | POA: Diagnosis not present

## 2018-01-16 DIAGNOSIS — E785 Hyperlipidemia, unspecified: Secondary | ICD-10-CM | POA: Diagnosis present

## 2018-01-16 DIAGNOSIS — I9581 Postprocedural hypotension: Secondary | ICD-10-CM | POA: Diagnosis not present

## 2018-01-16 DIAGNOSIS — Z95828 Presence of other vascular implants and grafts: Secondary | ICD-10-CM

## 2018-01-16 DIAGNOSIS — G8929 Other chronic pain: Secondary | ICD-10-CM | POA: Diagnosis present

## 2018-01-16 DIAGNOSIS — F17210 Nicotine dependence, cigarettes, uncomplicated: Secondary | ICD-10-CM | POA: Diagnosis present

## 2018-01-16 DIAGNOSIS — G4733 Obstructive sleep apnea (adult) (pediatric): Secondary | ICD-10-CM | POA: Diagnosis present

## 2018-01-16 DIAGNOSIS — E162 Hypoglycemia, unspecified: Secondary | ICD-10-CM | POA: Diagnosis present

## 2018-01-16 DIAGNOSIS — I130 Hypertensive heart and chronic kidney disease with heart failure and stage 1 through stage 4 chronic kidney disease, or unspecified chronic kidney disease: Secondary | ICD-10-CM | POA: Diagnosis present

## 2018-01-16 DIAGNOSIS — I714 Abdominal aortic aneurysm, without rupture, unspecified: Secondary | ICD-10-CM | POA: Diagnosis present

## 2018-01-16 DIAGNOSIS — K219 Gastro-esophageal reflux disease without esophagitis: Secondary | ICD-10-CM | POA: Diagnosis present

## 2018-01-16 DIAGNOSIS — Z8349 Family history of other endocrine, nutritional and metabolic diseases: Secondary | ICD-10-CM

## 2018-01-16 DIAGNOSIS — D62 Acute posthemorrhagic anemia: Secondary | ICD-10-CM | POA: Diagnosis not present

## 2018-01-16 DIAGNOSIS — K661 Hemoperitoneum: Secondary | ICD-10-CM | POA: Diagnosis not present

## 2018-01-16 DIAGNOSIS — Z7902 Long term (current) use of antithrombotics/antiplatelets: Secondary | ICD-10-CM

## 2018-01-16 DIAGNOSIS — J9611 Chronic respiratory failure with hypoxia: Secondary | ICD-10-CM | POA: Diagnosis present

## 2018-01-16 DIAGNOSIS — R112 Nausea with vomiting, unspecified: Secondary | ICD-10-CM | POA: Diagnosis present

## 2018-01-16 DIAGNOSIS — Z8249 Family history of ischemic heart disease and other diseases of the circulatory system: Secondary | ICD-10-CM

## 2018-01-16 DIAGNOSIS — I5032 Chronic diastolic (congestive) heart failure: Secondary | ICD-10-CM | POA: Diagnosis present

## 2018-01-16 DIAGNOSIS — Z888 Allergy status to other drugs, medicaments and biological substances status: Secondary | ICD-10-CM

## 2018-01-16 DIAGNOSIS — I480 Paroxysmal atrial fibrillation: Secondary | ICD-10-CM | POA: Diagnosis present

## 2018-01-16 DIAGNOSIS — E782 Mixed hyperlipidemia: Secondary | ICD-10-CM | POA: Diagnosis present

## 2018-01-16 DIAGNOSIS — J449 Chronic obstructive pulmonary disease, unspecified: Secondary | ICD-10-CM | POA: Diagnosis present

## 2018-01-16 DIAGNOSIS — R1084 Generalized abdominal pain: Secondary | ICD-10-CM

## 2018-01-16 DIAGNOSIS — R0602 Shortness of breath: Secondary | ICD-10-CM

## 2018-01-16 DIAGNOSIS — K449 Diaphragmatic hernia without obstruction or gangrene: Secondary | ICD-10-CM | POA: Diagnosis present

## 2018-01-16 DIAGNOSIS — K683 Retroperitoneal hematoma: Secondary | ICD-10-CM | POA: Diagnosis present

## 2018-01-16 DIAGNOSIS — Z8679 Personal history of other diseases of the circulatory system: Secondary | ICD-10-CM

## 2018-01-16 DIAGNOSIS — Z7984 Long term (current) use of oral hypoglycemic drugs: Secondary | ICD-10-CM

## 2018-01-16 DIAGNOSIS — Z955 Presence of coronary angioplasty implant and graft: Secondary | ICD-10-CM

## 2018-01-16 DIAGNOSIS — E11649 Type 2 diabetes mellitus with hypoglycemia without coma: Secondary | ICD-10-CM | POA: Diagnosis not present

## 2018-01-16 DIAGNOSIS — E875 Hyperkalemia: Secondary | ICD-10-CM | POA: Diagnosis present

## 2018-01-16 DIAGNOSIS — E1122 Type 2 diabetes mellitus with diabetic chronic kidney disease: Secondary | ICD-10-CM | POA: Diagnosis present

## 2018-01-16 DIAGNOSIS — R6510 Systemic inflammatory response syndrome (SIRS) of non-infectious origin without acute organ dysfunction: Secondary | ICD-10-CM

## 2018-01-16 DIAGNOSIS — Z9981 Dependence on supplemental oxygen: Secondary | ICD-10-CM

## 2018-01-16 DIAGNOSIS — Z825 Family history of asthma and other chronic lower respiratory diseases: Secondary | ICD-10-CM

## 2018-01-16 DIAGNOSIS — I10 Essential (primary) hypertension: Secondary | ICD-10-CM | POA: Diagnosis present

## 2018-01-16 LAB — CBG MONITORING, ED: Glucose-Capillary: 132 mg/dL — ABNORMAL HIGH (ref 65–99)

## 2018-01-16 MED ORDER — ALPRAZOLAM 0.25 MG PO TABS: 0.2500 mg | ORAL_TABLET | Freq: Every evening | ORAL | Status: DC | PRN

## 2018-01-16 MED ORDER — CEFAZOLIN SODIUM-DEXTROSE 1-4 GM/50ML-% IV SOLN
1.0000 g | INTRAVENOUS | Status: DC
  Filled 2018-01-16 (×2): qty 50

## 2018-01-16 MED ORDER — DULOXETINE HCL 60 MG PO CPEP
60.0000 mg | ORAL_CAPSULE | Freq: Every day | ORAL | Status: DC
  Administered 2018-01-17 – 2018-01-27 (×10): 60 mg via ORAL
  Filled 2018-01-16 (×10): qty 1

## 2018-01-16 MED ORDER — SODIUM CHLORIDE 0.9 % IV SOLN: 250.0000 mL | INTRAVENOUS | Status: DC | PRN

## 2018-01-16 MED ORDER — ONDANSETRON HCL 4 MG/2ML IJ SOLN
4.0000 mg | Freq: Four times a day (QID) | INTRAMUSCULAR | Status: DC | PRN
  Administered 2018-01-17: 4 mg via INTRAVENOUS

## 2018-01-16 MED ORDER — LABETALOL HCL 5 MG/ML IV SOLN
10.0000 mg | INTRAVENOUS | Status: DC | PRN
  Administered 2018-01-17: 10 mg via INTRAVENOUS
  Filled 2018-01-16: qty 4

## 2018-01-16 MED ORDER — HYDRALAZINE HCL 50 MG PO TABS
100.0000 mg | ORAL_TABLET | Freq: Three times a day (TID) | ORAL | Status: DC
  Administered 2018-01-16 – 2018-01-27 (×30): 100 mg via ORAL
  Filled 2018-01-16 (×31): qty 2

## 2018-01-16 MED ORDER — GUAIFENESIN-DM 100-10 MG/5ML PO SYRP: 15.0000 mL | ORAL_SOLUTION | ORAL | Status: DC | PRN

## 2018-01-16 MED ORDER — ALUM & MAG HYDROXIDE-SIMETH 200-200-20 MG/5ML PO SUSP: 15.0000 mL | ORAL | Status: DC | PRN

## 2018-01-16 MED ORDER — POTASSIUM CHLORIDE CRYS ER 10 MEQ PO TBCR
10.0000 meq | EXTENDED_RELEASE_TABLET | Freq: Every day | ORAL | Status: DC
  Filled 2018-01-16 (×2): qty 1

## 2018-01-16 MED ORDER — ALBUTEROL SULFATE (2.5 MG/3ML) 0.083% IN NEBU
2.5000 mg | INHALATION_SOLUTION | RESPIRATORY_TRACT | Status: DC | PRN
  Administered 2018-01-20: 2.5 mg via RESPIRATORY_TRACT
  Filled 2018-01-16: qty 3

## 2018-01-16 MED ORDER — VITAMIN D 1000 UNITS PO TABS
5000.0000 [IU] | ORAL_TABLET | Freq: Every day | ORAL | Status: DC
  Administered 2018-01-18 – 2018-01-27 (×9): 5000 [IU] via ORAL
  Filled 2018-01-16 (×11): qty 5

## 2018-01-16 MED ORDER — SODIUM CHLORIDE 0.9% FLUSH
3.0000 mL | Freq: Two times a day (BID) | INTRAVENOUS | Status: DC
  Administered 2018-01-17 (×2): 3 mL via INTRAVENOUS

## 2018-01-16 MED ORDER — CARVEDILOL 12.5 MG PO TABS: 25.0000 mg | ORAL_TABLET | ORAL | Status: AC

## 2018-01-16 MED ORDER — CLONIDINE HCL 0.2 MG PO TABS
0.2000 mg | ORAL_TABLET | Freq: Three times a day (TID) | ORAL | Status: DC
  Administered 2018-01-16 – 2018-01-18 (×6): 0.2 mg via ORAL
  Filled 2018-01-16 (×6): qty 1

## 2018-01-16 MED ORDER — SODIUM CHLORIDE 0.9% FLUSH: 3.0000 mL | INTRAVENOUS | Status: DC | PRN

## 2018-01-16 MED ORDER — FAMOTIDINE 20 MG PO TABS
20.0000 mg | ORAL_TABLET | Freq: Every day | ORAL | Status: DC
  Administered 2018-01-17 – 2018-01-18 (×2): 20 mg via ORAL
  Filled 2018-01-16 (×2): qty 1

## 2018-01-16 MED ORDER — CLOPIDOGREL BISULFATE 75 MG PO TABS
75.0000 mg | ORAL_TABLET | Freq: Every day | ORAL | Status: DC
  Administered 2018-01-17 – 2018-01-27 (×10): 75 mg via ORAL
  Filled 2018-01-16 (×11): qty 1

## 2018-01-16 MED ORDER — PANTOPRAZOLE SODIUM 40 MG PO TBEC
40.0000 mg | DELAYED_RELEASE_TABLET | Freq: Every day | ORAL | Status: DC
  Administered 2018-01-17 – 2018-01-18 (×2): 40 mg via ORAL
  Filled 2018-01-16 (×3): qty 1

## 2018-01-16 MED ORDER — ALLOPURINOL 100 MG PO TABS
100.0000 mg | ORAL_TABLET | Freq: Every day | ORAL | Status: DC
  Administered 2018-01-17 – 2018-01-18 (×2): 100 mg via ORAL
  Filled 2018-01-16 (×2): qty 1

## 2018-01-16 MED ORDER — MAGIC MOUTHWASH
15.0000 mL | Freq: Four times a day (QID) | ORAL | Status: DC | PRN
  Filled 2018-01-16: qty 15

## 2018-01-16 MED ORDER — HYDRALAZINE HCL 20 MG/ML IJ SOLN
5.0000 mg | INTRAMUSCULAR | Status: AC | PRN
  Administered 2018-01-17 (×2): 5 mg via INTRAVENOUS
  Filled 2018-01-16: qty 1

## 2018-01-16 MED ORDER — TRAMADOL HCL 50 MG PO TABS: 50.0000 mg | ORAL_TABLET | Freq: Four times a day (QID) | ORAL | Status: DC | PRN

## 2018-01-16 MED ORDER — PANTOPRAZOLE SODIUM 40 MG PO TBEC: 40.0000 mg | DELAYED_RELEASE_TABLET | Freq: Every day | ORAL | Status: DC

## 2018-01-16 MED ORDER — ISOSORBIDE MONONITRATE ER 30 MG PO TB24
15.0000 mg | ORAL_TABLET | Freq: Every day | ORAL | Status: DC
  Administered 2018-01-17 – 2018-01-27 (×10): 15 mg via ORAL
  Filled 2018-01-16 (×10): qty 1

## 2018-01-16 MED ORDER — HYDROCODONE-ACETAMINOPHEN 5-325 MG PO TABS
1.0000 | ORAL_TABLET | Freq: Once | ORAL | Status: AC
  Administered 2018-01-16: 1 via ORAL
  Filled 2018-01-16: qty 1

## 2018-01-16 MED ORDER — POTASSIUM CHLORIDE CRYS ER 20 MEQ PO TBCR
20.0000 meq | EXTENDED_RELEASE_TABLET | Freq: Once | ORAL | Status: AC
  Administered 2018-01-17: 20 meq via ORAL

## 2018-01-16 MED ORDER — PHENOL 1.4 % MT LIQD: 1.0000 | OROMUCOSAL | Status: DC | PRN

## 2018-01-16 MED ORDER — ZOLPIDEM TARTRATE 5 MG PO TABS
5.0000 mg | ORAL_TABLET | Freq: Every day | ORAL | Status: DC
  Administered 2018-01-16 – 2018-01-26 (×10): 5 mg via ORAL
  Filled 2018-01-16 (×10): qty 1

## 2018-01-16 MED ORDER — AMLODIPINE BESYLATE 10 MG PO TABS
10.0000 mg | ORAL_TABLET | Freq: Every day | ORAL | Status: DC
  Administered 2018-01-17 – 2018-01-27 (×10): 10 mg via ORAL
  Filled 2018-01-16 (×10): qty 1

## 2018-01-16 MED ORDER — FENOFIBRATE 160 MG PO TABS
160.0000 mg | ORAL_TABLET | Freq: Every day | ORAL | Status: DC
  Administered 2018-01-17 – 2018-01-18 (×2): 160 mg via ORAL
  Filled 2018-01-16 (×2): qty 1

## 2018-01-16 MED ORDER — FERROUS SULFATE 325 (65 FE) MG PO TABS
325.0000 mg | ORAL_TABLET | Freq: Two times a day (BID) | ORAL | Status: DC
  Administered 2018-01-18 (×2): 325 mg via ORAL
  Filled 2018-01-16 (×3): qty 1

## 2018-01-16 MED ORDER — FUROSEMIDE 40 MG PO TABS
40.0000 mg | ORAL_TABLET | Freq: Every day | ORAL | Status: DC
  Administered 2018-01-18: 40 mg via ORAL
  Filled 2018-01-16: qty 1

## 2018-01-16 MED ORDER — METOPROLOL TARTRATE 5 MG/5ML IV SOLN: 2.0000 mg | INTRAVENOUS | Status: DC | PRN

## 2018-01-16 MED ORDER — ATORVASTATIN CALCIUM 40 MG PO TABS
40.0000 mg | ORAL_TABLET | Freq: Every evening | ORAL | Status: DC
  Administered 2018-01-18: 40 mg via ORAL
  Filled 2018-01-16: qty 1

## 2018-01-16 MED ORDER — GLIPIZIDE 5 MG PO TABS
5.0000 mg | ORAL_TABLET | Freq: Every day | ORAL | Status: DC
  Administered 2018-01-18: 5 mg via ORAL
  Filled 2018-01-16 (×2): qty 1

## 2018-01-16 NOTE — ED Provider Notes (Signed)
[facility] EMERGENCY DEPARTMENT Provider Note   CSN: [id number] Arrival date & time: 01/16/18  1843     History   Chief Complaint Chief Complaint  Patient presents with  . Abdominal Pain    HPI [name] is a 67 y.o. female.  HPI Patient is a 67 year old female with a past medical history of known AAA who comes in today complaining of abdominal pain.  Patient had a recent CT scan which showed enlargement in size of for her AAA to 5.4 cm.  Per previous vascular note, they would consider repair if approaching 4.8 cm.  Patient denies any nausea vomiting hematemesis or other complaints.  Past Medical History:  Diagnosis Date  . AAA (abdominal aortic aneurysm) ([place]) 5/09   3.7x3.3 by u/s 2009  . Abnormal EKG    deep TW inversions chronic  . Anemia   . CAD (coronary artery disease)    a. CABG 03/2011 LIMA to LAD, SVG to OM, SVG to PDA. b. cath 06/25/2015 DES to SVG to rPDA, patent LIMA to LAD, patent SVG to OM  . carotid stenosis 5/09   S/p L CEA ;  60-79% bilat ICA by preCABG dopplers 7/12  . CHF (congestive heart failure) ([place])   . Chronic back pain    "all over" (06/24/2015)  . Chronic bronchitis ([place])    "get it pretty much q yr" (06/24/2015)  . CKD (chronic kidney disease)   . Complication of anesthesia 1966   "problem w/ether"  . COPD (chronic obstructive pulmonary disease) ([place])   . Coronary artery disease 5/09   s/p CABG 7/12   L-LAD, S-OM, S-PDA  . Depression   . GERD (gastroesophageal reflux disease)    With hiatal hernia  . GIB (gastrointestinal bleeding) 9/11   S/p EGD with cautery at [facility]  . Heart murmur   . History of blood transfusion "a few times"   "all related to anemia" (06/24/2015)  . History of hiatal hernia   . Hyperlipidemia   . Hypertension   . Mitral regurgitation    3+ MR by intraoperative TEE;  s/p MV repair with Dr. Roxy Manns 7/12  . Myocardial infarction [facility]) 2012 "several"  . On home oxygen therapy    "2 liters at  night; negative for sleep apnea"  . PAD (peripheral artery disease) (H[facility])    Severe; s/p bilateral renal artery stents, moderate in-stent restenosis  . Paroxysmal atrial fibrillation (H[facility])    coumadin;  echo 9/07: EF 60%, mild LVH;  s/p Cox Maze 7/12 with LAA clipping  . Subclavian artery stenosis, left (H[facility])    stented by Dr. Trula Slade on 10/18 to help flow of her LIMA to LAD  . Type II diabetes mellitus [facility])     Patient Active Problem List   Diagnosis Date Noted  . AAA (abdominal aortic aneurysm) ([place]) 01/16/2018  . Elevated brain natriuretic peptide (BNP) level 10/07/2015  . Diabetes mellitus ([place]) 10/07/2015  . Pain in the chest   . PAD (peripheral artery disease) ([place]) 07/29/2015  . Chest pain 07/04/2015  . Acute on chronic kidney failure ([place]) 07/04/2015  . GERD (gastroesophageal reflux disease) 07/04/2015  . Anemia 06/26/2015  . Coronary artery disease involving autologous vein coronary bypass graft with unstable angina pectoris ([place]) 06/24/2015  . Angina at rest [facility])   . Subclavian artery stenosis, left ([place]) 06/23/2015  . AAA (abdominal aortic aneurysm) without rupture ([place]) 12/10/2014  . Subclavian steal syndrome 12/10/2014  . Renal artery stenosis ([place]) 09/04/2013  .  Carotid artery stenosis 09/28/2012  . Mitral valve disorder 06/23/2011  . S/P CABG x 3 06/16/2011  . S/P Maze operation for atrial fibrillation 06/16/2011  . S/P MVR (mitral valve repair) 06/16/2011  . Follow-up examination following surgery 03/29/2011  . CKD (chronic kidney disease) 03/03/2011  . COPD (chronic obstructive pulmonary disease) ([place]) 03/03/2011  . Chronic diastolic heart failure ([place]) 02/26/2011  . Hypotension 02/26/2011  . NSTEMI (non-ST elevated myocardial infarction) ([place]) 02/26/2011  . Long term (current) use of anticoagulants 12/17/2010  . OBSTRUCTIVE SLEEP APNEA 09/04/2010  . HYPOXEMIA 09/04/2010  . SNORING 08/05/2010  . Atrial fibrillation ([place]) 12/09/2009  . WEIGHT LOSS  03/28/2009  . PALPITATIONS 03/28/2009  . PVD 11/21/2008  . HYPERLIPIDEMIA-MIXED 11/20/2008  . TOBACCO ABUSE 11/20/2008  . Essential hypertension 11/20/2008  . Coronary artery disease involving native heart with angina pectoris ([place]) 11/20/2008  . ABDOMINAL AORTIC ANEURYSM 11/20/2008    Past Surgical History:  Procedure Laterality Date  . ABDOMINAL HYSTERECTOMY  1990's  . APPENDECTOMY  Aug. 11, 2016   Ruptured  . AV FISTULA PLACEMENT Left 11/03/2017   Procedure: ARTERIOVENOUS (AV) FISTULA CREATION LEFT ARM;  Surgeon: Serafina Mitchell, MD;  Location: [facility];  Service: Vascular;  Laterality: Left;  . BOWEL RESECTION  2013  . CARDIAC CATHETERIZATION N/A 06/24/2015   Procedure: Left Heart Cath and Cors/Grafts Angiography;  Surgeon: Leonie Man, MD;  Location: [facility] CV LAB;  Service: Cardiovascular;  Laterality: N/A;  . CARDIAC CATHETERIZATION N/A 06/25/2015   Procedure: Coronary Stent Intervention;  Surgeon: Sherren Mocha, MD;  Location: [facility] CV LAB;  Service: Cardiovascular;  Laterality: N/A;  . CAROTID ENDARTERECTOMY Left   . CORONARY ANGIOPLASTY    . CORONARY ARTERY BYPASS GRAFT  03/05/2011   CABG X3 (LIMA to LAD, SVG to OM, SVG to PDA, EVH via left thigh  . CORONARY STENT PLACEMENT    . LACERATION REPAIR Right 1990's   WRIST  . MAZE  03/05/2011   complete biatrial lesion set with clipping of LA appendage  . MITRAL VALVE REPAIR  03/05/2011   79mm Memo 3D ring annuloplasty for ischemic MR  . PERIPHERAL VASCULAR CATHETERIZATION N/A 06/24/2015   Procedure: Aortic Arch Angiography;  Surgeon: Serafina Mitchell, MD;  Location: [place] CV LAB;  Service: Cardiovascular;  Laterality: N/A;  . PERIPHERAL VASCULAR CATHETERIZATION  06/24/2015   Procedure: Peripheral Vascular Intervention;  Surgeon: Serafina Mitchell, MD;  Location: [facility] CV LAB;  Service: Cardiovascular;;  . PERIPHERAL VASCULAR CATHETERIZATION N/A 06/24/2015   Procedure: Abdominal Aortogram;  Surgeon:  Serafina Mitchell, MD;  Location: [facility] CV LAB;  Service: Cardiovascular;  Laterality: N/A;  . RENAL ANGIOGRAM Bilateral 09/11/2013   Procedure: RENAL ANGIOGRAM;  Surgeon: Serafina Mitchell, MD;  [facility] CATH LAB;  Service: Cardiovascular;  Laterality: Bilateral;  . RIGHT FEMORAL-POPLITEAL BYPASS       OB History   None      Home Medications    Prior to Admission medications   Medication Sig Start Date End Date Taking? Authorizing Provider  albuterol (PROVENTIL HFA;VENTOLIN HFA) 108 (90 Base) MCG/ACT inhaler Inhale 2 puffs into the lungs every 4 (four) hours as needed for wheezing or shortness of breath. ProAir 10/08/15  Yes Delfina Redwood, MD  allopurinol (ZYLOPRIM) 100 MG tablet Take 100 mg by mouth daily. 02/02/17  Yes [provider]  ALPRAZolam Duanne Moron) 0.5 MG tablet Take 0.25 mg by mouth at bedtime as needed for anxiety.  10/16/15  Yes  [provider]  amLODipine (NORVASC) 10 MG tablet Take 10 mg by mouth daily.     Yes [provider]  atorvastatin (LIPITOR) 40 MG tablet Take 40 mg by mouth every evening.  12/07/17  Yes [provider]  carvedilol (COREG) 25 MG tablet TAKE 1 TABLET BY MOUTH 2 TIMES A DAY WITH A MEAL Patient taking differently: TAKE 25 MG BY MOUTH 2 TIMES A DAY WITH A MEAL 11/04/15  Yes Truitt Merle C, NP  Cholecalciferol (VITAMIN D3) 5000 units TABS Take 5,000 Units by mouth daily.   Yes [provider]  cloNIDine (CATAPRES) 0.2 MG tablet Take 1 tablet (0.2 mg total) by mouth 3 (three) times daily. 07/10/15  Yes Richardson Dopp T, PA-C  clopidogrel (PLAVIX) 75 MG tablet Take 1 tablet (75 mg total) by mouth daily with breakfast. 06/26/15  Yes Almyra Deforest, PA  DULoxetine (CYMBALTA) 60 MG capsule Take one capsule (60 mg dose) by mouth daily. 10/26/17  Yes [provider]  famotidine (PEPCID) 20 MG tablet TAKE 1 TABLET (20 MG TOTAL) BY MOUTH DAILY BEFORE SUPPER. 01/27/16  Yes Sherren Mocha, MD  fenofibrate 160 MG  tablet Take 1 tablet (160 mg total) by mouth daily. 10/08/15  Yes Delfina Redwood, MD  ferrous sulfate 325 (65 FE) MG tablet Take 325 mg by mouth 2 (two) times daily with a meal.   Yes [provider]  furosemide (LASIX) 40 MG tablet Take 40 mg by mouth daily. May take an additional tablet day as needed for weight gain  05/30/17  Yes [provider]  glipiZIDE (GLUCOTROL) 5 MG tablet Take one half tablet (2.5 mg dose) by mouth daily. 12/06/17  Yes [provider]  hydrALAZINE (APRESOLINE) 100 MG tablet Take 100 mg by mouth 3 (three) times daily.   Yes [provider]  HYDROcodone-acetaminophen (NORCO/VICODIN) 5-325 MG tablet Take 1 tablet by mouth every 8 (eight) hours as needed for moderate pain. 11/03/17  Yes Rhyne, Hulen Shouts, PA-C  isosorbide mononitrate (IMDUR) 30 MG 24 hr tablet Take 0.5 tablets (15 mg total) by mouth daily. 12/02/15  Yes Sherren Mocha, MD  magic mouthwash SOLN Take 15 mLs by mouth 4 (four) times daily as needed for mouth pain.  10/23/17  Yes [provider]  Omega-3 Fatty Acids (FISH OIL) 1000 MG CAPS Take 1,000 mg by mouth 2 (two) times daily.   Yes [provider]  pantoprazole (PROTONIX) 40 MG tablet Take 1 tablet (40 mg total) by mouth daily. 06/26/15  Yes Almyra Deforest, PA  Potassium 99 MG TABS Take 99 mg by mouth daily.    Yes [provider]  zolpidem (AMBIEN) 10 MG tablet Take 10 mg by mouth at bedtime.   Yes [provider]    Family History Family History  Problem Relation Age of Onset  . Emphysema Mother   . Heart disease Mother        before age 49  . Hypertension Mother   . Hyperlipidemia Mother   . Heart attack Mother   . AAA (abdominal aortic aneurysm) Mother        rupture  . Heart attack Father 69  . Emphysema Father   . Heart disease Father        before age 18  . Hyperlipidemia Father   . Hypertension Father   . Peripheral vascular disease Father   . Diabetes Brother   . Heart  disease Brother        before age  79  . Hyperlipidemia Brother   . Hypertension Brother   . Heart attack Brother        CABG  . Heart disease Other        Vascular disease in grandparents, uncles and dad    Social History Social History   Tobacco Use  . Smoking status: Current Every Day Smoker    Packs/day: 1.00    Years: 50.00    Pack years: 50.00    Types: Cigarettes  . Smokeless tobacco: Never Used  Substance Use Topics  . Alcohol use: Yes    Alcohol/week: 0.0 oz    Comment: occasionally  . Drug use: Yes    Types: Marijuana     Allergies   Isosorbide; Codeine; Contrast media [iodinated diagnostic agents]; Ioxaglate; Metrizamide; and Percocet [oxycodone-acetaminophen]   Review of Systems Review of Systems  Respiratory: Negative for shortness of breath.   Cardiovascular: Negative for chest pain.  Gastrointestinal: Positive for abdominal pain, nausea and vomiting.  All other systems reviewed and are negative.    Physical Exam Updated Vital Signs BP (!) 178/71 (BP Location: Left Arm)   Pulse (!) 57   Temp 98.4 F (36.9 C) (Oral)   Resp 15   Ht 5\' 5"  (1.651 m)   Wt 63.5 kg (140 lb)   SpO2 99%   BMI 23.30 kg/m   Physical Exam  Constitutional: She appears well-developed and well-nourished. No distress.  HENT:  Head: Normocephalic and atraumatic.  Eyes: Conjunctivae are normal.  Neck: Neck supple.  Cardiovascular: Normal rate and regular rhythm.  No murmur heard. Pulmonary/Chest: Effort normal and breath sounds normal. No respiratory distress.  Abdominal: Soft. There is generalized tenderness. There is no rebound and no guarding.  Musculoskeletal: She exhibits no edema.  Neurological: She is alert.  Skin: Skin is warm and dry.  Psychiatric: She has a normal mood and affect.  Nursing note and vitals reviewed.    ED Treatments / Results  Labs (all labs ordered are listed, but only abnormal results are displayed) Labs Reviewed  CBG MONITORING, ED -  Abnormal; Notable for the following components:      Result Value   Glucose-Capillary 132 (*)    All other components within normal limits  SURGICAL PCR SCREEN  PROTIME-INR  BASIC METABOLIC PANEL  CBC  HIV ANTIBODY (ROUTINE TESTING)  CBC  COMPREHENSIVE METABOLIC PANEL  PROTIME-INR  URINALYSIS, ROUTINE W REFLEX MICROSCOPIC    EKG None  Radiology No results found.  Procedures Procedures (including critical care time)  Medications Ordered in ED Medications  potassium chloride SA (K-DUR,KLOR-CON) CR tablet 20-40 mEq (has no administration in time range)  ondansetron (ZOFRAN) injection 4 mg (has no administration in time range)  alum & mag hydroxide-simeth (MAALOX/MYLANTA) 200-200-20 MG/5ML suspension 15-30 mL (has no administration in time range)  pantoprazole (PROTONIX) EC tablet 40 mg (has no administration in time range)  labetalol (NORMODYNE,TRANDATE) injection 10 mg (has no administration in time range)  hydrALAZINE (APRESOLINE) injection 5 mg (has no administration in time range)  metoprolol tartrate (LOPRESSOR) injection 2-5 mg (has no administration in time range)  guaiFENesin-dextromethorphan (ROBITUSSIN DM) 100-10 MG/5ML syrup 15 mL (has no administration in time range)  phenol (CHLORASEPTIC) mouth spray 1 spray (has no administration in time range)  sodium chloride flush (NS) 0.9 % injection 3 mL (has no administration in time range)  sodium chloride flush (NS) 0.9 % injection 3 mL (has no administration in time range)  0.9 %  sodium chloride infusion (has  no administration in time range)  traMADol (ULTRAM) tablet 50 mg (has no administration in time range)  amLODipine (NORVASC) tablet 10 mg (has no administration in time range)  clopidogrel (PLAVIX) tablet 75 mg (has no administration in time range)  pantoprazole (PROTONIX) EC tablet 40 mg (has no administration in time range)  cloNIDine (CATAPRES) tablet 0.2 mg (0.2 mg Oral Given 01/16/18 2147)  albuterol  (PROVENTIL) (2.5 MG/3ML) 0.083% nebulizer solution 2.5 mg (has no administration in time range)  fenofibrate tablet 160 mg (160 mg Oral Not Given 01/16/18 2140)  ALPRAZolam (XANAX) tablet 0.25 mg (has no administration in time range)  carvedilol (COREG) tablet 25 mg (25 mg Oral Not Given 01/16/18 2140)  isosorbide mononitrate (IMDUR) 24 hr tablet 15 mg (has no administration in time range)  famotidine (PEPCID) tablet 20 mg (20 mg Oral Not Given 01/16/18 2145)  allopurinol (ZYLOPRIM) tablet 100 mg (100 mg Oral Not Given 01/16/18 2140)  hydrALAZINE (APRESOLINE) tablet 100 mg (100 mg Oral Given 01/16/18 2147)  furosemide (LASIX) tablet 40 mg (has no administration in time range)  potassium chloride (K-DUR,KLOR-CON) CR tablet 10 mEq (99 mg Oral Not Given 01/16/18 2141)  cholecalciferol (VITAMIN D) tablet 5,000 Units (5,000 Units Oral Not Given 01/16/18 2141)  ferrous sulfate tablet 325 mg (has no administration in time range)  zolpidem (AMBIEN) tablet 5 mg (5 mg Oral Given 01/16/18 2147)  magic mouthwash (has no administration in time range)  DULoxetine (CYMBALTA) DR capsule 60 mg (60 mg Oral Not Given 01/16/18 2141)  glipiZIDE (GLUCOTROL) tablet 5 mg (has no administration in time range)  atorvastatin (LIPITOR) tablet 40 mg (40 mg Oral Not Given 01/16/18 2142)  ceFAZolin (ANCEF) IVPB 1 g/50 mL premix (has no administration in time range)  HYDROcodone-acetaminophen (NORCO/VICODIN) 5-325 MG per tablet 1 tablet (1 tablet Oral Given 01/16/18 1940)     Initial Impression / Assessment and Plan / ED Course  I have reviewed the triage vital signs and the nursing notes.  Pertinent labs & imaging results that were available during my care of the patient were reviewed by me and considered in my medical decision making (see chart for details).     Patient with diffuse tenderness to palpation the abdomen on exam.  Patient with normal bowel sounds.  I contacted vascular surgery who state they wish to admit the  patient for likely repair tomorrow.  No labs results of the time of admission.  Final Clinical Impressions(s) / ED Diagnoses   Final diagnoses:  None    ED Discharge Orders    None       Chapman Moss, MD 01/17/18 Adelfa Koh    Davonna Belling, MD 01/18/18 [phone]

## 2018-01-16 NOTE — ED Triage Notes (Signed)
Pt from home, c/o lower abdominal pain radiating to her left side and back x1 week; pt seen at [facility] Saturday night for same, was told her "aneurysm had grown"; pt spoke with her vascular MD today (Dr. Trula Slade), and was told to come to [facility] for possible surgery tomorrow

## 2018-01-16 NOTE — ED Notes (Signed)
Attempted to call report

## 2018-01-16 NOTE — ED Notes (Signed)
Floor nurse advised RN to hold pt. at ER until pt.'s room is ready ( bed not clean ) will notify ER when room is ready.

## 2018-01-16 NOTE — H&P (Signed)
[facility] [place]  Patient name: [name] MRN: [id number] DOB: June 03, 1951 Sex: female    HPI: [name] is a 67 y.o. female admitted with nominal pain and known abdominal aortic aneurysm.  Her aneurysm is been known for 10 years.  It is been small and slowly growing over time.  She over the past 2 weeks has had illness where she had nausea and was very fatigued.  Was placed on antibiotics for this with no improvement.  Over the past she has had generalized abdominal pain which radiates across her lower abdomen.  This can be somewhat more in the left flank than the right.  She was seen over the weekend with a noncontrast CT showing maximal diameter of her aneurysm that expanded from her recent ultrasound of 4.8 cm to maximal diameter by CT scan to 5.4 cm.  There was no evidence of leak.  The patient has known chronic renal insufficiency and not have a contrast study.  Seen by primary care physician today and was discussed with Dr. Trula Slade who is been following is now patient is recommended that she come to the emergency department for further evaluation  Past Medical History:  Diagnosis Date  . AAA (abdominal aortic aneurysm) ([place]) 5/09   3.7x3.3 by u/s 2009  . Abnormal EKG    deep TW inversions chronic  . Anemia   . CAD (coronary artery disease)    a. CABG 03/2011 LIMA to LAD, SVG to OM, SVG to PDA. b. cath 06/25/2015 DES to SVG to rPDA, patent LIMA to LAD, patent SVG to OM  . carotid stenosis 5/09   S/p L CEA ;  60-79% bilat ICA by preCABG dopplers 7/12  . CHF (congestive heart failure) ([place])   . Chronic back pain    "all over" (06/24/2015)  . Chronic bronchitis ([place])    "get it pretty much q yr" (06/24/2015)  . CKD (chronic kidney disease)   . Complication of anesthesia 1966   "problem w/ether"  . COPD (chronic obstructive pulmonary disease) ([place])   . Coronary artery disease 5/09   s/p CABG 7/12   L-LAD, S-OM,  S-PDA  . Depression   . GERD (gastroesophageal reflux disease)    With hiatal hernia  . GIB (gastrointestinal bleeding) 9/11   S/p EGD with cautery at [facility]  . Heart murmur   . History of blood transfusion "a few times"   "all related to anemia" (06/24/2015)  . History of hiatal hernia   . Hyperlipidemia   . Hypertension   . Mitral regurgitation    3+ MR by intraoperative TEE;  s/p MV repair with Dr. Roxy Manns 7/12  . Myocardial infarction [facility]) 2012 "several"  . On home oxygen therapy    "2 liters at night; negative for sleep apnea"  . PAD (peripheral artery disease) ([facility])    Severe; s/p bilateral renal artery stents, moderate in-stent restenosis  . Paroxysmal atrial fibrillation ([facility])    coumadin;  echo 9/07: EF 60%, mild LVH;  s/p [facility] 7/12 with LAA clipping  . Subclavian artery stenosis, left ([facility])    stented by Dr. Trula Slade on 10/18 to help flow of her LIMA to LAD  . Type II diabetes mellitus (H[facility])     Family History  Problem Relation Age of Onset  . Emphysema Mother   . Heart disease Mother        before age 73  . Hypertension Mother   . Hyperlipidemia Mother   .  Heart attack Mother   . AAA (abdominal aortic aneurysm) Mother        rupture  . Heart attack Father 34  . Emphysema Father   . Heart disease Father        before age 66  . Hyperlipidemia Father   . Hypertension Father   . Peripheral vascular disease Father   . Diabetes Brother   . Heart disease Brother        before age 34  . Hyperlipidemia Brother   . Hypertension Brother   . Heart attack Brother        CABG  . Heart disease Other        Vascular disease in grandparents, uncles and dad    SOCIAL HISTORY: Social History   Tobacco Use  . Smoking status: Current Every Day Smoker    Packs/day: 1.00    Years: 50.00    Pack years: 50.00    Types: Cigarettes  . Smokeless tobacco: Never Used  Substance Use Topics  . Alcohol use: Yes    Alcohol/week: 0.0 oz    Comment: occasionally    Allergies   Allergen Reactions  . Isosorbide     UNSPECIFIED REACTION - okay in low doses  . Codeine Itching  . Contrast Media [Iodinated Diagnostic Agents] Itching and Other (See Comments)    Itching of feet  . Ioxaglate Itching and Other (See Comments)    Itching of feet  . Metrizamide Itching and Other (See Comments)    Itching of feet  . Percocet [Oxycodone-Acetaminophen] Itching and Other (See Comments)    Tolerates Hydrocodone    Current Facility-Administered Medications  Medication Dose Route Frequency Provider Last Rate Last Dose  . albuterol (PROVENTIL HFA;VENTOLIN HFA) 108 (90 Base) MCG/ACT inhaler 2 puff  2 puff Inhalation Q4H PRN Navy Rothschild, Arvilla Meres, MD      . allopurinol (ZYLOPRIM) tablet 100 mg  100 mg Oral Daily Jeriah Skufca, Arvilla Meres, MD      . ALPRAZolam Duanne Moron) tablet 0.25 mg  0.25 mg Oral QHS PRN Zennie Ayars, Arvilla Meres, MD      . amLODipine (NORVASC) tablet 10 mg  10 mg Oral Daily Rosilyn Coachman, Arvilla Meres, MD      . atorvastatin (LIPITOR) tablet 40 mg  40 mg Oral QPM Chastity Noland, Arvilla Meres, MD      . carvedilol (COREG) tablet 25 mg  25 mg Oral 1 day or 1 dose Raynee Mccasland, Arvilla Meres, MD      . cloNIDine (CATAPRES) tablet 0.2 mg  0.2 mg Oral TID Rosetta Posner, MD      . Derrill Memo ON 01/17/2018] clopidogrel (PLAVIX) tablet 75 mg  75 mg Oral Q breakfast Vinny Taranto, Arvilla Meres, MD      . DULoxetine (CYMBALTA) DR capsule 60 mg  60 mg Oral Daily Rees Santistevan, Arvilla Meres, MD      . famotidine (PEPCID) tablet 20 mg  20 mg Oral Daily Karee Christopherson, Arvilla Meres, MD      . fenofibrate tablet 160 mg  160 mg Oral Daily Feliberto Stockley, Arvilla Meres, MD      . Derrill Memo ON 01/17/2018] ferrous sulfate tablet 325 mg  325 mg Oral BID WC Sheza Strickland, Arvilla Meres, MD      . furosemide (LASIX) tablet 40 mg  40 mg Oral Daily Leetta Hendriks, Arvilla Meres, MD      . Derrill Memo ON 01/17/2018] glipiZIDE (GLUCOTROL) tablet 5 mg  5 mg Oral QAC breakfast Jordyn Doane, Arvilla Meres, MD      . hydrALAZINE (APRESOLINE)  tablet 100 mg  100 mg Oral TID Edvin Albus, Arvilla Meres, MD      . isosorbide mononitrate (IMDUR) 24 hr tablet 15 mg  15 mg Oral Daily Yaziel Brandon, Arvilla Meres, MD       . magic mouthwash  15 mL Oral QID PRN Nakeeta Sebastiani, Arvilla Meres, MD      . pantoprazole (PROTONIX) EC tablet 40 mg  40 mg Oral Daily Breeanna Galgano, Arvilla Meres, MD      . Potassium TABS 99 mg  99 mg Oral Daily Evrett Hakim, Arvilla Meres, MD      . Vitamin D3 TABS 5,000 Units  5,000 Units Oral Daily Mylissa Lambe, Arvilla Meres, MD      . zolpidem (AMBIEN) tablet 10 mg  10 mg Oral QHS Rosetta Posner, MD       Current Outpatient Medications  Medication Sig Dispense Refill  . albuterol (PROVENTIL HFA;VENTOLIN HFA) 108 (90 Base) MCG/ACT inhaler Inhale 2 puffs into the lungs every 4 (four) hours as needed for wheezing or shortness of breath. ProAir 1 each 0  . allopurinol (ZYLOPRIM) 100 MG tablet Take 100 mg by mouth daily.  2  . ALPRAZolam (XANAX) 0.5 MG tablet Take 0.25 mg by mouth at bedtime as needed for anxiety.     Marland Kitchen amLODipine (NORVASC) 10 MG tablet Take 10 mg by mouth daily.      Marland Kitchen atorvastatin (LIPITOR) 40 MG tablet Take 40 mg by mouth every evening.     . carvedilol (COREG) 25 MG tablet TAKE 1 TABLET BY MOUTH 2 TIMES A DAY WITH A MEAL (Patient taking differently: TAKE 25 MG BY MOUTH 2 TIMES A DAY WITH A MEAL) 60 tablet 9  . Cholecalciferol (VITAMIN D3) 5000 units TABS Take 5,000 Units by mouth daily.    . cloNIDine (CATAPRES) 0.2 MG tablet Take 1 tablet (0.2 mg total) by mouth 3 (three) times daily. 90 tablet 11  . clopidogrel (PLAVIX) 75 MG tablet Take 1 tablet (75 mg total) by mouth daily with breakfast. 90 tablet 3  . DULoxetine (CYMBALTA) 60 MG capsule Take one capsule (60 mg dose) by mouth daily.  1  . famotidine (PEPCID) 20 MG tablet TAKE 1 TABLET (20 MG TOTAL) BY MOUTH DAILY BEFORE SUPPER. 30 tablet 2  . fenofibrate 160 MG tablet Take 1 tablet (160 mg total) by mouth daily. 30 tablet 1  . ferrous sulfate 325 (65 FE) MG tablet Take 325 mg by mouth 2 (two) times daily with a meal.    . furosemide (LASIX) 40 MG tablet Take 40 mg by mouth daily. May take an additional tablet day as needed for weight gain   6  . glipiZIDE (GLUCOTROL) 5  MG tablet Take one half tablet (2.5 mg dose) by mouth daily.  23  . hydrALAZINE (APRESOLINE) 100 MG tablet Take 100 mg by mouth 3 (three) times daily.    Marland Kitchen HYDROcodone-acetaminophen (NORCO/VICODIN) 5-325 MG tablet Take 1 tablet by mouth every 8 (eight) hours as needed for moderate pain. 8 tablet 0  . isosorbide mononitrate (IMDUR) 30 MG 24 hr tablet Take 0.5 tablets (15 mg total) by mouth daily. 45 tablet 3  . magic mouthwash SOLN Take 15 mLs by mouth 4 (four) times daily as needed for mouth pain.     . Omega-3 Fatty Acids (FISH OIL) 1000 MG CAPS Take 1,000 mg by mouth 2 (two) times daily.    . pantoprazole (PROTONIX) 40 MG tablet Take 1 tablet (40 mg total) by mouth daily. 30 tablet 11  .  Potassium 99 MG TABS Take 99 mg by mouth daily.     Marland Kitchen zolpidem (AMBIEN) 10 MG tablet Take 10 mg by mouth at bedtime.      REVIEW OF SYSTEMS:  [X]  denotes positive finding, [ ]  denotes negative finding Cardiac  Comments:  Chest pain or chest pressure:    Shortness of breath upon exertion:    Short of breath when lying flat:    Irregular heart rhythm:        Vascular    Pain in calf, thigh, or hip brought on by ambulation:    Pain in feet at night that wakes you up from your sleep:     Blood clot in your veins:    Leg swelling:  x         PHYSICAL EXAM: Vitals:   01/16/18 1917 01/16/18 1918  BP: (!) 174/52   Pulse: (!) 58   Resp: 16   Temp: 98.3 F (36.8 C)   TempSrc: Oral   SpO2: 100%   Weight:  141 lb (64 kg)  Height:  5\' 5"  (1.651 m)    GENERAL: The patient is a well-nourished female, in no acute distress. The vital signs are documented above. CARDIOVASCULAR: 2+ radial pulses 2-3+ right femoral pulse and 1+ left femoral pulse.  I do not palpate popliteal or distal pulses.   Abdomen: Mild diffuse lower abdominal tenderness. PULMONARY: There is good air exchange  MUSCULOSKELETAL: There are no major deformities or cyanosis. NEUROLOGIC: No focal weakness or paresthesias are  detected. SKIN: There are no ulcers or rashes noted. PSYCHIATRIC: The patient has a normal affect.  DATA:  I reviewed her CT scan images and discussed these with the patient and 2 of her daughters present.  She does have extreme calcification of her aortoiliac segments.  She does not have any evidence of leak from her 01/14/2018 CT scan she does have adequate proximal neck for fixation for stent graft.  She does have a great deal of calcification in her iliac vessels did explain to the patient and her family that access with the deployment device may be her biggest issue regarding endograft repair.  MEDICAL ISSUES: Abdominal aortic aneurysm with some growth over time.  Abdominal and flank pain of unknown etiology.  Discussed with Dr. Trula Slade.  The patient will be admitted tonight with plan for stent graft repair with Dr. Trula Slade tomorrow.    Rosetta Posner, MD FACS [facility] Tel [phone] Pager [phone]

## 2018-01-17 ENCOUNTER — Inpatient Hospital Stay (HOSPITAL_COMMUNITY): Payer: Medicare Other | Admitting: Anesthesiology

## 2018-01-17 ENCOUNTER — Encounter (HOSPITAL_COMMUNITY)
Admission: EM | Disposition: A | Discharge status: Home Health | Payer: Self-pay | Source: Home / Self Care | Attending: Surgery

## 2018-01-17 ENCOUNTER — Encounter (HOSPITAL_COMMUNITY): Payer: Self-pay

## 2018-01-17 ENCOUNTER — Inpatient Hospital Stay (HOSPITAL_COMMUNITY): Payer: Medicare Other

## 2018-01-17 DIAGNOSIS — I714 Abdominal aortic aneurysm, without rupture: Secondary | ICD-10-CM

## 2018-01-17 HISTORY — PX: ABDOMINAL AORTIC ENDOVASCULAR STENT GRAFT: SHX5707

## 2018-01-17 LAB — CBC
HCT: 35.1 % — ABNORMAL LOW (ref 36.0–46.0)
HEMATOCRIT: 26.9 % — AB (ref 36.0–46.0)
Hemoglobin: 8.7 g/dL — ABNORMAL LOW (ref 12.0–15.0)
Hemoglobin: 11.6 g/dL — ABNORMAL LOW (ref 12.0–15.0)
MCH: 31.4 pg (ref 26.0–34.0)
MCH: 29.5 pg (ref 26.0–34.0)
MCHC: 32.3 g/dL (ref 30.0–36.0)
MCHC: 33 g/dL (ref 30.0–36.0)
MCV: 97.1 fL (ref 78.0–100.0)
MCV: 89.3 fL (ref 78.0–100.0)
PLATELETS: 203 10*3/uL (ref 150–400)
Platelets: 268 10*3/uL (ref 150–400)
RBC: 2.77 MIL/uL — ABNORMAL LOW (ref 3.87–5.11)
RBC: 3.93 MIL/uL (ref 3.87–5.11)
RDW: 14.9 % (ref 11.5–15.5)
RDW: 16.6 % — AB (ref 11.5–15.5)
WBC: 7.9 10*3/uL (ref 4.0–10.5)
WBC: 21.1 10*3/uL — AB (ref 4.0–10.5)

## 2018-01-17 LAB — COMPREHENSIVE METABOLIC PANEL
ALT: 9 U/L — ABNORMAL LOW (ref 14–54)
ANION GAP: 11 (ref 5–15)
AST: 13 U/L — ABNORMAL LOW (ref 15–41)
Albumin: 2.6 g/dL — ABNORMAL LOW (ref 3.5–5.0)
Alkaline Phosphatase: 42 U/L (ref 38–126)
BILIRUBIN TOTAL: 0.5 mg/dL (ref 0.3–1.2)
BUN: 20 mg/dL (ref 6–20)
CO2: 28 mmol/L (ref 22–32)
Calcium: 8.6 mg/dL — ABNORMAL LOW (ref 8.9–10.3)
Chloride: 107 mmol/L (ref 101–111)
Creatinine, Ser: 2.29 mg/dL — ABNORMAL HIGH (ref 0.44–1.00)
GFR calc Af Amer: 25 mL/min — ABNORMAL LOW (ref >60)
GFR, EST NON AFRICAN AMERICAN: 21 mL/min — AB (ref >60)
Glucose, Bld: 121 mg/dL — ABNORMAL HIGH (ref 65–99)
POTASSIUM: 3.9 mmol/L (ref 3.5–5.1)
Sodium: 146 mmol/L — ABNORMAL HIGH (ref 135–145)
Total Protein: 5.2 g/dL — ABNORMAL LOW (ref 6.5–8.1)

## 2018-01-17 LAB — GLUCOSE, CAPILLARY
GLUCOSE-CAPILLARY: 120 mg/dL — AB (ref 65–99)
GLUCOSE-CAPILLARY: 117 mg/dL — AB (ref 65–99)
GLUCOSE-CAPILLARY: 233 mg/dL — AB (ref 65–99)
Glucose-Capillary: 193 mg/dL — ABNORMAL HIGH (ref 65–99)

## 2018-01-17 LAB — URINALYSIS, ROUTINE W REFLEX MICROSCOPIC
Bilirubin Urine: NEGATIVE
Glucose, UA: NEGATIVE mg/dL
Hgb urine dipstick: NEGATIVE
Ketones, ur: NEGATIVE mg/dL
Leukocytes, UA: NEGATIVE
NITRITE: NEGATIVE
PH: 6 (ref 5.0–8.0)
Protein, ur: NEGATIVE mg/dL
Specific Gravity, Urine: 1.009 (ref 1.005–1.030)

## 2018-01-17 LAB — PROTIME-INR
INR: 1.05
INR: 1.22
PROTHROMBIN TIME: 13.6 s (ref 11.4–15.2)
PROTHROMBIN TIME: 15.3 s — AB (ref 11.4–15.2)

## 2018-01-17 LAB — MAGNESIUM: MAGNESIUM: 1.5 mg/dL — AB (ref 1.7–2.4)

## 2018-01-17 LAB — BASIC METABOLIC PANEL WITH GFR
Anion gap: 12 (ref 5–15)
BUN: 21 mg/dL — ABNORMAL HIGH (ref 6–20)
CO2: 21 mmol/L — ABNORMAL LOW (ref 22–32)
Calcium: 7.7 mg/dL — ABNORMAL LOW (ref 8.9–10.3)
Chloride: 108 mmol/L (ref 101–111)
Creatinine, Ser: 2.43 mg/dL — ABNORMAL HIGH (ref 0.44–1.00)
GFR calc Af Amer: 23 mL/min — ABNORMAL LOW
GFR calc non Af Amer: 20 mL/min — ABNORMAL LOW
Glucose, Bld: 244 mg/dL — ABNORMAL HIGH (ref 65–99)
Potassium: 5.8 mmol/L — ABNORMAL HIGH (ref 3.5–5.1)
Sodium: 141 mmol/L (ref 135–145)

## 2018-01-17 LAB — PREPARE RBC (CROSSMATCH)

## 2018-01-17 LAB — APTT: APTT: 36 s (ref 24–36)

## 2018-01-17 LAB — HIV ANTIBODY (ROUTINE TESTING W REFLEX): HIV Screen 4th Generation wRfx: NONREACTIVE

## 2018-01-17 LAB — SURGICAL PCR SCREEN
MRSA, PCR: NEGATIVE
STAPHYLOCOCCUS AUREUS: NEGATIVE

## 2018-01-17 SURGERY — INSERTION, ENDOVASCULAR STENT GRAFT, AORTA, ABDOMINAL
Anesthesia: General | Site: Abdomen

## 2018-01-17 MED ORDER — PHENOL 1.4 % MT LIQD: 1.0000 | OROMUCOSAL | Status: DC | PRN

## 2018-01-17 MED ORDER — SODIUM CHLORIDE 0.9 % IV SOLN
INTRAVENOUS | Status: DC
  Administered 2018-01-17 – 2018-01-18 (×3): via INTRAVENOUS

## 2018-01-17 MED ORDER — HEPARIN SODIUM (PORCINE) 1000 UNIT/ML IJ SOLN
INTRAMUSCULAR | Status: DC | PRN
  Administered 2018-01-17 (×3): 3000 [IU] via INTRAVENOUS

## 2018-01-17 MED ORDER — ONDANSETRON HCL 4 MG/2ML IJ SOLN: 4.0000 mg | Freq: Once | INTRAMUSCULAR | Status: DC | PRN

## 2018-01-17 MED ORDER — BISACODYL 10 MG RE SUPP: 10.0000 mg | Freq: Every day | RECTAL | Status: DC | PRN

## 2018-01-17 MED ORDER — GUAIFENESIN-DM 100-10 MG/5ML PO SYRP: 15.0000 mL | ORAL_SOLUTION | ORAL | Status: DC | PRN

## 2018-01-17 MED ORDER — MAGNESIUM SULFATE 2 GM/50ML IV SOLN: 2.0000 g | Freq: Every day | INTRAVENOUS | Status: DC | PRN

## 2018-01-17 MED ORDER — HYDROCODONE-ACETAMINOPHEN 5-325 MG PO TABS
1.0000 | ORAL_TABLET | Freq: Four times a day (QID) | ORAL | Status: DC | PRN
  Administered 2018-01-17: 1 via ORAL
  Administered 2018-01-18 – 2018-01-19 (×3): 2 via ORAL
  Filled 2018-01-17: qty 2
  Filled 2018-01-17: qty 1
  Filled 2018-01-17 (×2): qty 2

## 2018-01-17 MED ORDER — SODIUM CHLORIDE 0.9 % IV SOLN: Freq: Once | INTRAVENOUS | Status: DC

## 2018-01-17 MED ORDER — DOCUSATE SODIUM 100 MG PO CAPS
100.0000 mg | ORAL_CAPSULE | Freq: Every day | ORAL | Status: DC
  Filled 2018-01-17: qty 1

## 2018-01-17 MED ORDER — GLYCOPYRROLATE 0.2 MG/ML IJ SOLN
INTRAMUSCULAR | Status: DC | PRN
  Administered 2018-01-17: 0.4 mg via INTRAVENOUS

## 2018-01-17 MED ORDER — DEXAMETHASONE SODIUM PHOSPHATE 4 MG/ML IJ SOLN
INTRAMUSCULAR | Status: DC | PRN
  Administered 2018-01-17: 10 mg via INTRAVENOUS

## 2018-01-17 MED ORDER — FENTANYL CITRATE (PF) 250 MCG/5ML IJ SOLN
INTRAMUSCULAR | Status: AC
  Filled 2018-01-17: qty 5

## 2018-01-17 MED ORDER — LACTATED RINGERS IV SOLN: INTRAVENOUS | Status: DC

## 2018-01-17 MED ORDER — MIDAZOLAM HCL 2 MG/2ML IJ SOLN
INTRAMUSCULAR | Status: AC
  Filled 2018-01-17: qty 2

## 2018-01-17 MED ORDER — SODIUM CHLORIDE 0.9 % IV SOLN
INTRAVENOUS | Status: DC | PRN
  Administered 2018-01-17: 17:00:00

## 2018-01-17 MED ORDER — HEPARIN SODIUM (PORCINE) 5000 UNIT/ML IJ SOLN
5000.0000 [IU] | Freq: Three times a day (TID) | INTRAMUSCULAR | Status: DC
  Administered 2018-01-18 – 2018-01-19 (×3): 5000 [IU] via SUBCUTANEOUS
  Filled 2018-01-17 (×3): qty 1

## 2018-01-17 MED ORDER — PROTAMINE SULFATE 10 MG/ML IV SOLN
INTRAVENOUS | Status: DC | PRN
  Administered 2018-01-17 (×5): 10 mg via INTRAVENOUS

## 2018-01-17 MED ORDER — EPHEDRINE SULFATE 50 MG/ML IJ SOLN
INTRAMUSCULAR | Status: AC
  Filled 2018-01-17: qty 1

## 2018-01-17 MED ORDER — ONDANSETRON HCL 4 MG/2ML IJ SOLN
4.0000 mg | Freq: Four times a day (QID) | INTRAMUSCULAR | Status: DC | PRN
  Administered 2018-01-18 – 2018-01-19 (×3): 4 mg via INTRAVENOUS
  Filled 2018-01-17 (×3): qty 2

## 2018-01-17 MED ORDER — ACETAMINOPHEN 325 MG PO TABS: 325.0000 mg | ORAL_TABLET | ORAL | Status: DC | PRN

## 2018-01-17 MED ORDER — POLYETHYLENE GLYCOL 3350 17 G PO PACK: 17.0000 g | PACK | Freq: Every day | ORAL | Status: DC | PRN

## 2018-01-17 MED ORDER — ROCURONIUM BROMIDE 100 MG/10ML IV SOLN
INTRAVENOUS | Status: DC | PRN
  Administered 2018-01-17: 50 mg via INTRAVENOUS

## 2018-01-17 MED ORDER — HYDROCORTISONE NA SUCCINATE PF 100 MG IJ SOLR
INTRAMUSCULAR | Status: DC | PRN
  Administered 2018-01-17: 125 mg via INTRAVENOUS

## 2018-01-17 MED ORDER — FENTANYL CITRATE (PF) 100 MCG/2ML IJ SOLN
INTRAMUSCULAR | Status: DC | PRN
  Administered 2018-01-17: 75 ug via INTRAVENOUS
  Administered 2018-01-17: 50 ug via INTRAVENOUS
  Administered 2018-01-17: 75 ug via INTRAVENOUS
  Administered 2018-01-17: 50 ug via INTRAVENOUS

## 2018-01-17 MED ORDER — INSULIN ASPART 100 UNIT/ML ~~LOC~~ SOLN
0.0000 [IU] | Freq: Three times a day (TID) | SUBCUTANEOUS | Status: DC
  Administered 2018-01-18 (×2): 3 [IU] via SUBCUTANEOUS

## 2018-01-17 MED ORDER — METOPROLOL TARTRATE 5 MG/5ML IV SOLN: 2.0000 mg | INTRAVENOUS | Status: DC | PRN

## 2018-01-17 MED ORDER — SODIUM CHLORIDE 0.9 % IV SOLN
INTRAVENOUS | Status: DC
  Administered 2018-01-17: 10:00:00 via INTRAVENOUS

## 2018-01-17 MED ORDER — POTASSIUM CHLORIDE CRYS ER 20 MEQ PO TBCR: 20.0000 meq | EXTENDED_RELEASE_TABLET | Freq: Every day | ORAL | Status: DC | PRN

## 2018-01-17 MED ORDER — MIDAZOLAM HCL 5 MG/5ML IJ SOLN
INTRAMUSCULAR | Status: DC | PRN
  Administered 2018-01-17: 1 mg via INTRAVENOUS

## 2018-01-17 MED ORDER — LACTATED RINGERS IV SOLN
INTRAVENOUS | Status: DC | PRN
  Administered 2018-01-17 (×3): via INTRAVENOUS

## 2018-01-17 MED ORDER — LIDOCAINE HCL (CARDIAC) PF 100 MG/5ML IV SOSY
PREFILLED_SYRINGE | INTRAVENOUS | Status: DC | PRN
  Administered 2018-01-17: 60 mg via INTRAVENOUS

## 2018-01-17 MED ORDER — LACTATED RINGERS IV SOLN
INTRAVENOUS | Status: DC | PRN
  Administered 2018-01-17: 16:00:00 via INTRAVENOUS

## 2018-01-17 MED ORDER — HYDRALAZINE HCL 20 MG/ML IJ SOLN
5.0000 mg | INTRAMUSCULAR | Status: DC | PRN
  Filled 2018-01-17 (×2): qty 1

## 2018-01-17 MED ORDER — FENTANYL CITRATE (PF) 100 MCG/2ML IJ SOLN
INTRAMUSCULAR | Status: AC
  Filled 2018-01-17: qty 2

## 2018-01-17 MED ORDER — CEFAZOLIN SODIUM-DEXTROSE 2-3 GM-%(50ML) IV SOLR
INTRAVENOUS | Status: DC | PRN
  Administered 2018-01-17: 2 g via INTRAVENOUS

## 2018-01-17 MED ORDER — ONDANSETRON HCL 4 MG/2ML IJ SOLN
INTRAMUSCULAR | Status: AC
  Filled 2018-01-17: qty 2

## 2018-01-17 MED ORDER — CEFAZOLIN SODIUM-DEXTROSE 2-4 GM/100ML-% IV SOLN
2.0000 g | Freq: Three times a day (TID) | INTRAVENOUS | Status: AC
  Administered 2018-01-18 (×2): 2 g via INTRAVENOUS
  Filled 2018-01-17 (×2): qty 100

## 2018-01-17 MED ORDER — PROPOFOL 10 MG/ML IV BOLUS
INTRAVENOUS | Status: AC
Start: 2018-01-17 — End: ?
  Filled 2018-01-17: qty 20

## 2018-01-17 MED ORDER — SODIUM CHLORIDE 0.9 % IV SOLN
INTRAVENOUS | Status: AC
  Filled 2018-01-17: qty 1.2

## 2018-01-17 MED ORDER — ACETAMINOPHEN 325 MG RE SUPP: 325.0000 mg | RECTAL | Status: DC | PRN

## 2018-01-17 MED ORDER — PROPOFOL 10 MG/ML IV BOLUS
INTRAVENOUS | Status: DC | PRN
  Administered 2018-01-17 (×2): 20 mg via INTRAVENOUS
  Administered 2018-01-17: 50 mg via INTRAVENOUS
  Administered 2018-01-17: 150 mg via INTRAVENOUS

## 2018-01-17 MED ORDER — 0.9 % SODIUM CHLORIDE (POUR BTL) OPTIME
TOPICAL | Status: DC | PRN
  Administered 2018-01-17: 1000 mL

## 2018-01-17 MED ORDER — FENTANYL CITRATE (PF) 100 MCG/2ML IJ SOLN
25.0000 ug | INTRAMUSCULAR | Status: DC | PRN
  Administered 2018-01-17 (×3): 25 ug via INTRAVENOUS

## 2018-01-17 MED ORDER — MORPHINE SULFATE (PF) 2 MG/ML IV SOLN
2.0000 mg | INTRAVENOUS | Status: DC | PRN
  Administered 2018-01-18 – 2018-01-24 (×16): 2 mg via INTRAVENOUS
  Filled 2018-01-17 (×16): qty 1

## 2018-01-17 MED ORDER — LABETALOL HCL 5 MG/ML IV SOLN
10.0000 mg | INTRAVENOUS | Status: AC | PRN
  Administered 2018-01-18 – 2018-01-19 (×4): 10 mg via INTRAVENOUS
  Filled 2018-01-17 (×4): qty 4

## 2018-01-17 MED ORDER — NEOSTIGMINE METHYLSULFATE 10 MG/10ML IV SOLN
INTRAVENOUS | Status: DC | PRN
  Administered 2018-01-17: 3 mg via INTRAVENOUS

## 2018-01-17 MED ORDER — DEXAMETHASONE SODIUM PHOSPHATE 10 MG/ML IJ SOLN
INTRAMUSCULAR | Status: AC
  Filled 2018-01-17: qty 1

## 2018-01-17 MED ORDER — SODIUM CHLORIDE 0.9 % IV SOLN: 500.0000 mL | Freq: Once | INTRAVENOUS | Status: DC | PRN

## 2018-01-17 SURGICAL SUPPLY — 84 items
BAG DECANTER FOR FLEXI CONT (MISCELLANEOUS) IMPLANT
BALLN CODA OCL 2-9.0-35-120-3 (BALLOONS)
BALLN MUSTANG 6X80X75 (BALLOONS) ×4
BALLOON COD OCL 2-9.0-35-120-3 (BALLOONS) IMPLANT
BALLOON MUSTANG 6X80X75 (BALLOONS) ×2 IMPLANT
CANISTER SUCT 3000ML PPV (MISCELLANEOUS) ×4 IMPLANT
CATH ANGIO 5F BER2 65CM (CATHETERS) ×4 IMPLANT
CATH OMNI FLUSH .035X70CM (CATHETERS) ×4 IMPLANT
CATH STRAIGHT 5FR 65CM (CATHETERS) ×4 IMPLANT
COVER PROBE W GEL 5X96 (DRAPES) ×4 IMPLANT
DERMABOND ADVANCED (GAUZE/BANDAGES/DRESSINGS) ×4
DERMABOND ADVANCED .7 DNX12 (GAUZE/BANDAGES/DRESSINGS) ×4 IMPLANT
DEVICE CLOSURE PERCLS PRGLD 6F (VASCULAR PRODUCTS) ×8 IMPLANT
DEVICE INFLATION ENCORE 26 (MISCELLANEOUS) ×4 IMPLANT
DEVICE TORQUE H2O (MISCELLANEOUS) ×8 IMPLANT
DRAIN CHANNEL 10F 3/8 F FF (DRAIN) IMPLANT
DRAIN CHANNEL 10M FLAT 3/4 FLT (DRAIN) IMPLANT
DRAPE ZERO GRAVITY STERILE (DRAPES) ×4 IMPLANT
DRSG TEGADERM 2-3/8X2-3/4 SM (GAUZE/BANDAGES/DRESSINGS) ×8 IMPLANT
DRYSEAL FLEXSHEATH 12FR 33CM (SHEATH) ×2
DRYSEAL FLEXSHEATH 16FR 33CM (SHEATH) ×2
ELECT REM PT RETURN 9FT ADLT (ELECTROSURGICAL) ×8
ELECTRODE REM PT RTRN 9FT ADLT (ELECTROSURGICAL) ×4 IMPLANT
EVACUATOR 3/16  PVC DRAIN (DRAIN)
EVACUATOR 3/16 PVC DRAIN (DRAIN) IMPLANT
EVACUATOR SILICONE 100CC (DRAIN) IMPLANT
EXCLUDER TNK 26X14.5MMX12CM (Endovascular Graft) ×2 IMPLANT
EXCLUDER TRUNK 26X14.5MMX12CM (Endovascular Graft) ×4 IMPLANT
FILTER CO2 0.2 MICRON (VASCULAR PRODUCTS) ×4 IMPLANT
GAUZE SPONGE 2X2 8PLY STRL LF (GAUZE/BANDAGES/DRESSINGS) ×4 IMPLANT
GLIDEWIRE ADV .035X260CM (WIRE) ×4 IMPLANT
GLOVE BIO SURGEON STRL SZ 6.5 (GLOVE) ×6 IMPLANT
GLOVE BIO SURGEONS STRL SZ 6.5 (GLOVE) ×2
GLOVE BIOGEL PI IND STRL 7.5 (GLOVE) ×2 IMPLANT
GLOVE BIOGEL PI INDICATOR 7.5 (GLOVE) ×2
GLOVE SURG SS PI 7.5 STRL IVOR (GLOVE) ×4 IMPLANT
GOWN STRL REUS W/ TWL LRG LVL3 (GOWN DISPOSABLE) ×4 IMPLANT
GOWN STRL REUS W/ TWL XL LVL3 (GOWN DISPOSABLE) ×2 IMPLANT
GOWN STRL REUS W/TWL LRG LVL3 (GOWN DISPOSABLE) ×4
GOWN STRL REUS W/TWL XL LVL3 (GOWN DISPOSABLE) ×2
GRAFT BALLN CATH 65CM (STENTS) ×2 IMPLANT
GUIDEWIRE ANGLED .035X150CM (WIRE) ×8 IMPLANT
HEMOSTAT SNOW SURGICEL 2X4 (HEMOSTASIS) IMPLANT
KIT BASIN OR (CUSTOM PROCEDURE TRAY) ×4 IMPLANT
KIT TURNOVER KIT B (KITS) ×4 IMPLANT
LEG CONTRALATERAL 16X20X13.5 (Vascular Products) ×2 IMPLANT
LEG CONTRALETERAL16X16X11.5 (Endovascular Graft) ×2 IMPLANT
NEEDLE PERC 18GX7CM (NEEDLE) ×4 IMPLANT
NS IRRIG 1000ML POUR BTL (IV SOLUTION) ×4 IMPLANT
PACK ENDOVASCULAR (PACKS) ×4 IMPLANT
PAD ARMBOARD 7.5X6 YLW CONV (MISCELLANEOUS) ×8 IMPLANT
PERCLOSE PROGLIDE 6F (VASCULAR PRODUCTS) ×16
SET MICROPUNCTURE 5F STIFF (MISCELLANEOUS) ×4 IMPLANT
SHEATH AVANTI 11CM 8FR (SHEATH) IMPLANT
SHEATH BRITE TIP 8FR 23CM (SHEATH) ×4 IMPLANT
SHEATH DRYSEAL FLEX 12FR 33CM (SHEATH) ×2 IMPLANT
SHEATH DRYSEAL FLEX 16FR 33CM (SHEATH) ×2 IMPLANT
SHEATH PINNACLE 8F 10CM (SHEATH) ×4 IMPLANT
SHIELD RADPAD SCOOP 12X17 (MISCELLANEOUS) ×4 IMPLANT
SNARE GOOSENECK 10MM (VASCULAR PRODUCTS) ×4 IMPLANT
SPONGE GAUZE 2X2 STER 10/PKG (GAUZE/BANDAGES/DRESSINGS) ×4
STENT GRAFT BALLN CATH 65CM (STENTS) ×2
STENT GRAFT CONTRALAT 16X11.5 (Endovascular Graft) ×2 IMPLANT
STENT GRAFT CONTRALAT 20X13.5 (Vascular Products) ×2 IMPLANT
STENT VIABAHN 11X59X135 (Permanent Stent) ×2 IMPLANT
STENT VIABAHN 11X59X135CM (Permanent Stent) ×2 IMPLANT
STOPCOCK MORSE 400PSI 3WAY (MISCELLANEOUS) ×8 IMPLANT
SUT ETHILON 3 0 PS 1 (SUTURE) IMPLANT
SUT PROLENE 5 0 C 1 24 (SUTURE) IMPLANT
SUT VIC AB 2-0 CT1 27 (SUTURE)
SUT VIC AB 2-0 CT1 TAPERPNT 27 (SUTURE) IMPLANT
SUT VIC AB 3-0 SH 27 (SUTURE)
SUT VIC AB 3-0 SH 27X BRD (SUTURE) IMPLANT
SUT VICRYL 4-0 PS2 18IN ABS (SUTURE) ×8 IMPLANT
SYR 30ML LL (SYRINGE) IMPLANT
TAPE STRIPS DRAPE STRL (GAUZE/BANDAGES/DRESSINGS) ×4 IMPLANT
TOWEL GREEN STERILE (TOWEL DISPOSABLE) ×4 IMPLANT
TRAY FOLEY MTR SLVR 16FR STAT (SET/KITS/TRAYS/PACK) ×4 IMPLANT
TUBING HIGH PRESSURE 120CM (CONNECTOR) ×4 IMPLANT
WIRE AMPLATZ SS-J .035X180CM (WIRE) IMPLANT
WIRE AMPLATZ SS-J .035X260CM (WIRE) ×8 IMPLANT
WIRE BENTSON .035X145CM (WIRE) ×8 IMPLANT
WIRE HI TORQ VERSACORE J 260CM (WIRE) ×4 IMPLANT
WIRE TORQFLEX AUST .018X40CM (WIRE) ×4 IMPLANT

## 2018-01-17 NOTE — Transfer of Care (Signed)
Immediate Anesthesia Transfer of Care Note  Patient: [name]  Procedure(s) Performed: ABDOMINAL AORTIC ENDOVASCULAR STENT GRAFT WITH CO2 (N/A Abdomen)  Patient Location: PACU  Anesthesia Type:General  Level of Consciousness: awake, alert , oriented and patient cooperative  Airway & Oxygen Therapy: Patient Spontanous Breathing and Patient connected to face mask oxygen  Post-op Assessment: Report given to RN and Post -op Vital signs reviewed and unstable, Anesthesiologist notified  Post vital signs: Reviewed  Last Vitals:  Vitals Value Taken Time  BP 104/43 01/17/2018  7:43 PM  Temp    Pulse 76 01/17/2018  7:42 PM  Resp 26 01/17/2018  7:43 PM  SpO2 100 % 01/17/2018  7:43 PM  Vitals shown include unvalidated device data.  Last Pain:  Vitals:   01/17/18 0800  TempSrc:   PainSc: 1          Complications: No apparent anesthesia complications   BP significantly lower when arrivedl to PACU.  Waveform dampened on A-line.  A-line flushed with improvement in waveform.  BP remains low (481/85) systolic a-line.  RR even and unlabored with no complaints of pain.  Pale skin.  Abdomen soft.  Groin sites soft with no hematoma noted.  Pulses by doppler bilateral DP/PT.  Dr. Gifford Shave notified of drop in bp.  I-stat ran.  Hgb-6.8.  Dr. Gifford Shave at bedside reviewing blood gas results.  Ordered two units of PRBC to be given by PACU.

## 2018-01-17 NOTE — Op Note (Signed)
Patient name: [name] MRN: [id number] DOB: [birth year] Sex: female  01/17/2018 Pre-operative Diagnosis: Symptomatic abdominal aortic aneurysm Post-operative diagnosis:  Same Surgeon:  [name] Assistants:  Leontine Locket Procedure:   #1: Endovascular repair of abdominal aortic aneurysm   #2: Abdominal aortogram   #3: Ultrasound-guided percutaneous common femoral artery access   #4: Catheter in aorta x2 Anesthesia: General Blood Loss:  100 cc Specimens: None  Findings: Complete exclusion of the aneurysm.  A total of 7 cc of contrast was utilized in conjunction with CO2 given the patient's renal insufficiency.  She had near occlusion of the left proximal common iliac artery.  In order to cross this I had to snare it from the contralateral right side.  A VBX stent was placed across this lesion inside the aortic graft as there was residual luminal narrowing on the completion arteriogram  Indications: I have been following the patient for long time for an abdominal aortic aneurysm.  Most recently in February it measured 4.8 cm.  She was nearing the time where she is going to start dialysis.  She has had a several day history of abdominal pain.  She went to the ER over the weekend and a noncontrasted CT scan showed a 5.4 cm aneurysm.  The patient's pain has not resolved and therefore we discussed proceeding with aneurysm repair in order to prevent rupture.  She understands the risk of the operation including possibly requiring dialysis initiation.  All of her questions were answered.  Devices used: Main body was primary right Gore 26 x 14 x 12.  Ipsilateral right extension was a Gore 16 x 11.5.  Contralateral left was a Gore 20 x 13.5.  A VBX 11 x 59 stent was placed in the left limb of the graft at the level of the aortic bifurcation  Procedure:  The patient was identified in the holding area and taken to [place] 16  The patient was then placed supine on the table. general anesthesia  was administered.  The patient was prepped and draped in the usual sterile fashion.  A time out was called and antibiotics were administered.  Ultrasound was used to evaluate bilateral common femoral arteries.  There were heavily calcified.  A digital ultrasound image was acquired.  I first began on the right.  The right common femoral artery was cannulated under ultrasound guidance with a micropuncture needle.  An 018 wire was advanced without resistance followed by placement of the micropuncture sheath.  An 40 Bentson wire was then inserted without resistance.  An 8 [place] dilator was used to dilate the subcutaneous tract.  Pro-glide devices were deployed at the 11:00 and 1 o'clock position.  Attention was then turned towards the left groin.  The artery was evaluated ultrasound and found to be patent but smaller in caliber than the right side.  It was heavily calcified.  It was cannulated under ultrasound guidance with a micropuncture needle.  The 018 wire was inserted however it did not advance into the aorta.  Micropuncture sheath was placed followed by a Bentson wire.  I then inserted a Berenstein 2 catheter and try to gain wire access into the aorta.  I also tried to use a Glidewire.  Retrograde injections were performed which showed near total occlusion of the left common iliac artery.  At this point the patient was given 3000 units of heparin.  From the right side I inserted a Omni Flush catheter.  With the use of a  Glidewire after significant difficulty I was able to advance the Glidewire into the left common iliac artery.  I then placed an 8 French sheath through the left groin and used a 10 mm gooseneck snare to grab the Glidewire.  I brought the wire that was snared out through the sheath in the left groin and then advanced a catheter from the right side out through the sheath in the left groin.  A stiff Glidewire was then placed through the catheter in the left groin.  I then used a 6 x 80 Mustang  balloon to perform balloon angioplasty of the proximal left common iliac artery.  I then gained a second wire access to the sheath in the left groin, and I was able to advance the wire into the aorta.  It was exchanged out for an Amplatz superstiff wire.  The stiff Glidewire was then able to be withdrawn through the left sheath and advanced into the aorta so that I had 2 wires in the aorta from the left sheath.  An Amplatz superstiff wire was then placed in the aorta from the right side.  I removed the 8 French sheath in the left groin and over the stiff Glidewire I was able to place 2 pro-glide devices at the 11:00 and 1 o'clock position for pre-closure.  A 12 French sheath was then inserted over the Amplatz wire after removing the stiff Glidewire.  A 12 French sheath was able to be advanced into the aorta without significant difficulty.  I then advanced a 87 French sheath into the aorta from the right side.  An additional 3000 units of heparin was then given.  An ACT was checked and she was subtherapeutic so an additional 3000 units of heparin was given.  I then prepared the main body on the back table.  This was a Gouru 26 x 14 x 12 device.  It was advanced up the right side.  Because the patient had a stent in her lower left renal artery I deployed the graft at the level of the left renal stent without administering any contrast.  Next, the contralateral gate was cannulated with a Omni Flush catheter and a Glidewire.  The catheter was able to be advanced into the main body of the graft and able to be freely rotated, confirming successful cannulation.  An Amplatz superstiff wire was then placed.  The image detector was then brought down to the pelvis and a right anterior oblique vision.  A retrograde injection with CO2 was performed locating the left hypogastric artery.  The dilator for the 12 French sheath was then reinserted and the sheath advanced into the contralateral gate.  The contralateral extension was  prepared on the back table.  This was a Gouru 20 x 13.5 device.  It was loaded into the sheath.  The sheath was withdrawn into the left external iliac artery and the graft was deployed landing proximal to the left hypogastric artery.  Next, the remaining portion of the ipsilateral limb was deployed and the delivery system was removed.  A marked Omni Flush catheter was then inserted and a retrograde injection was performed through the sheath in the right groin with CO2 locating the right hypogastric artery.  The right ipsilateral extension was prepared on the back table and inserted through the sheath.  This was a Gore 16 x 11.5 device.  It was deployed landing at the level of the right hypogastric artery.  Next, a MOB balloon was used to mold the  proximal and distal attachment sites as well as device overlap.  There was significant narrowing at the level of the left common iliac occlusion.  The balloon was aggressively used at this level.  Next, a pigtail catheter was inserted into the graft and a completion arteriogram was performed with CO2.  This showed complete exclusion of the aneurysm and preservation of patency of the renal arteries as well as bilateral common, internal, and external iliac arteries.  I then performed a retrograde injection with contrast through the sheath in the left groin which showed residual narrowing at the level of the left common iliac occlusion.  I was uncomfortable with the appearance of this and elected to support this with the use of the deployment of a VBX 11 x 59 stent.  After this was deployed, I reinserted the MOB balloon to fully expand the stent to fit the size of the iliac limb.  At this point, I was satisfied with the repair.  The patient had excellent pulsatile flow through the sheath in each external iliac artery.  Bentson wires were placed bilaterally.  The sheath in the right groin was removed first and the pro glides were secured, closing the arteriotomy.  Similarly the  sheath in the left groin was removed and the pro glides were deployed.  There is some residual bleeding from the sheath in the left groin and so manual pressure was held for 15 minutes.  50 mg of protamine was given.  While we are holding pressure, we checked Doppler signals in the feet.  She has Doppler signals that were present and similar to her preoperative exam.  Once a groins were found to be hemostatic.  Cautery was used for the subcutaneous tissue and the skin next were closed with Dermabond.  The patient was then successfully extubated and taken to recovery room in stable condition.  There were no immediate complications.   Disposition: To PACU stable   V. [name], M.D. [facility] [place] Office: [phone] Pager:  [phone]

## 2018-01-17 NOTE — Progress Notes (Signed)
    Subjective  -   Continued abdominal pain   Physical Exam:  abd tender Pedal pulses not palpable       Assessment/Plan:    AAA: discussed with patient that because of size and pain, she is at risk of rupture of her AAA.  I recommend proceeding with repair.  We discussed the risks and benefits, especially the risk of renal failure and dialysis.  She understands and wants to proceed  Wells Gala Padovano 01/17/2018 11:54 AM --  Vitals:   01/17/18 0535 01/17/18 0846  BP: (!) 144/50 (!) 161/55  Pulse: (!) 58   Resp: 20   Temp:    SpO2: 98%     Intake/Output Summary (Last 24 hours) at 01/17/2018 1154 Last data filed at 01/17/2018 0730 Gross per 24 hour  Intake 0 ml  Output 950 ml  Net -950 ml     Laboratory CBC    Component Value Date/Time   WBC 7.9 01/17/2018 0331   HGB 8.7 (L) 01/17/2018 0331   HCT 26.9 (L) 01/17/2018 0331   PLT 268 01/17/2018 0331    BMET    Component Value Date/Time   NA 146 (H) 01/17/2018 0331   K 3.9 01/17/2018 0331   CL 107 01/17/2018 0331   CO2 28 01/17/2018 0331   GLUCOSE 121 (H) 01/17/2018 0331   BUN 20 01/17/2018 0331   CREATININE 2.29 (H) 01/17/2018 0331   CREATININE 1.32 (H) 06/17/2015 1257   CALCIUM 8.6 (L) 01/17/2018 0331   GFRNONAA 21 (L) 01/17/2018 0331   GFRAA 25 (L) 01/17/2018 0331    COAG Lab Results  Component Value Date   INR 1.05 01/17/2018   INR 0.97 07/03/2015   INR 0.89 06/17/2015   No results found for: PTT  Antibiotics Anti-infectives (From admission, onward)   Start     Dose/Rate Route Frequency Ordered Stop   01/17/18 0600  ceFAZolin (ANCEF) IVPB 1 g/50 mL premix    Note to Pharmacy:  Send with pt to OR   1 g 100 mL/hr over 30 Minutes Intravenous On call 01/16/18 2055 01/18/18 0600       V. Leia Alf, M.D. [facility] [place] Office: [phone] Pager:  [phone]

## 2018-01-17 NOTE — Anesthesia Procedure Notes (Signed)
Arterial Line Insertion Start/End5/14/2019 2:30 PM, 01/17/2018 2:36 PM Performed by: Oletta Lamas, CRNA, CRNA  Preanesthetic checklist: patient identified, IV checked, site marked, risks and benefits discussed, surgical consent, monitors and equipment checked, pre-op evaluation and timeout performed Lidocaine 1% used for infiltration radial was placed Catheter size: 20 G Hand hygiene performed , maximum sterile barriers used  and Seldinger technique used Allen's test indicative of satisfactory collateral circulation Attempts: 1 Procedure performed without using ultrasound guided technique. Ultrasound Notes:anatomy identified Following insertion, Biopatch and dressing applied. Post procedure assessment: normal  Patient tolerated the procedure well with no immediate complications.

## 2018-01-17 NOTE — Progress Notes (Signed)
Post op EVAR:  Patient's blood pressure is with a systolic around 037. She is getting ready to receive 2 units of blood. 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 Her feet are warm however Doppler signals are faint which is related to her hypotension.  She has Doppler signals in both groins below the cannulation site, so at this time I do not feel there has been any complication from her percutaneous access.  We will continue to monitor perfusion to her lower extremities as her blood pressure increases.  [name]

## 2018-01-17 NOTE — Progress Notes (Signed)
Anesthesia MD present at bedside.  CRNA concerned about Aline vs cuff pressure.  Aline upon arrival to PACU was 95/41 and was 40 or so higher systolic when they left the OR room.  CRNA will check CBC.  Will continue to monitor and notify for further changes.

## 2018-01-17 NOTE — Anesthesia Procedure Notes (Signed)
Procedure Name: Intubation Date/Time: 01/17/2018 4:02 PM Performed by: Oletta Lamas, CRNA Pre-anesthesia Checklist: Patient identified, Emergency Drugs available, Suction available and Patient being monitored Patient Re-evaluated:Patient Re-evaluated prior to induction Oxygen Delivery Method: Circle System Utilized Preoxygenation: Pre-oxygenation with 100% oxygen Induction Type: IV induction Ventilation: Mask ventilation without difficulty Laryngoscope Size: Miller and 3 Grade View: Grade I Tube type: Oral Tube size: 7.0 mm Number of attempts: 1 Airway Equipment and Method: Stylet and Oral airway Placement Confirmation: ETT inserted through vocal cords under direct vision,  positive ETCO2 and breath sounds checked- equal and bilateral Secured at: 22 cm Tube secured with: Tape Dental Injury: Teeth and Oropharynx as per pre-operative assessment

## 2018-01-17 NOTE — Anesthesia Preprocedure Evaluation (Addendum)
Anesthesia Evaluation  Patient identified by MRN, date of birth, ID band Patient awake    Reviewed: Allergy & Precautions, NPO status , Patient's Chart, lab work & pertinent test results  Airway Mallampati: II  TM Distance: >3 FB Neck ROM: Full    Dental  (+) Edentulous Upper, Edentulous Lower   Pulmonary COPD,  COPD inhaler and oxygen dependent, Current Smoker,  2L [place] at night   Pulmonary exam normal breath sounds clear to auscultation       Cardiovascular hypertension, Pt. on medications and Pt. on home beta blockers + CAD, + Past MI, + Cardiac Stents, + CABG and +CHF  Normal cardiovascular exam+ dysrhythmias Atrial Fibrillation + Valvular Problems/Murmurs MR  Rhythm:Regular Rate:Normal  ECG: SB, incomplete RBBB, rate 58  ECHO: Left ventricle: The cavity size was normal. Systolic function was normal. The estimated ejection fraction was in the range of 55% to 60%. Wall motion was normal; there were no regional wall motion abnormalities. There was a reduced contribution of atrial contraction to ventricular filling, due to increased ventricular diastolic pressure or atrial contractile dysfunction. Doppler parameters are consistent with a reversible restrictive pattern, indicative of decreased left ventricular diastolic compliance and/or increased left atrial pressure (grade 3 diastolic dysfunction). Doppler parameters are consistent with high ventricular filling pressure. 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 The findings are consistent with moderate stenosis. Mean gradient (D): 8 mm Hg. Peak gradient (D): 21 mm Hg. Valve area by   pressure half-time: 1.59 cm^2. Valve area by continuity equation (using LVOT flow): 1.2 cm^2. Left atrium: The atrium was mildly dilated. Anterior-posterior dimension: 41 mm. Atrial septum: There was increased thickness of the septum, consistent with lipomatous hypertrophy.  Sees  cardiologist Burt Knack)   Neuro/Psych PSYCHIATRIC DISORDERS Depression negative neurological ROS     GI/Hepatic Neg liver ROS, hiatal hernia, GERD  Medicated and Controlled,  Endo/Other  diabetes, Oral Hypoglycemic Agents  Renal/GU ESRFRenal disease     Musculoskeletal negative musculoskeletal ROS (+) Chronic back pain   Abdominal   Peds  Hematology  (+) anemia ,   Anesthesia Other Findings 5.4 cm AAA  Reproductive/Obstetrics                            Anesthesia Physical Anesthesia Plan  ASA: IV  Anesthesia Plan: General   Post-op Pain Management:    Induction: Intravenous  PONV Risk Score and Plan: 2 and Ondansetron, Dexamethasone, Midazolam and Treatment may vary due to age or medical condition  Airway Management Planned: Oral ETT  Additional Equipment: Arterial line  Intra-op Plan:   Post-operative Plan: Extubation in OR and Possible Post-op intubation/ventilation  Informed Consent: I have reviewed the patients History and Physical, chart, labs and discussed the procedure including the risks, benefits and alternatives for the proposed anesthesia with the patient or authorized representative who has indicated his/her understanding and acceptance.   Dental advisory given  Plan Discussed with: CRNA  Anesthesia Plan Comments:        Anesthesia Quick Evaluation

## 2018-01-18 ENCOUNTER — Encounter (HOSPITAL_COMMUNITY): Payer: Self-pay | Admitting: Surgery

## 2018-01-18 LAB — CBC
HCT: 33.3 % — ABNORMAL LOW (ref 36.0–46.0)
Hemoglobin: 11.3 g/dL — ABNORMAL LOW (ref 12.0–15.0)
MCH: 29.9 pg (ref 26.0–34.0)
MCHC: 33.9 g/dL (ref 30.0–36.0)
MCV: 88.1 fL (ref 78.0–100.0)
PLATELETS: 200 10*3/uL (ref 150–400)
RBC: 3.78 MIL/uL — AB (ref 3.87–5.11)
RDW: 17.4 % — ABNORMAL HIGH (ref 11.5–15.5)
WBC: 24.6 10*3/uL — AB (ref 4.0–10.5)

## 2018-01-18 LAB — BASIC METABOLIC PANEL
Anion gap: 10 (ref 5–15)
BUN: 27 mg/dL — AB (ref 6–20)
CALCIUM: 7.8 mg/dL — AB (ref 8.9–10.3)
CO2: 23 mmol/L (ref 22–32)
Chloride: 108 mmol/L (ref 101–111)
Creatinine, Ser: 2.65 mg/dL — ABNORMAL HIGH (ref 0.44–1.00)
GFR calc Af Amer: 21 mL/min — ABNORMAL LOW (ref >60)
GFR, EST NON AFRICAN AMERICAN: 18 mL/min — AB (ref >60)
Glucose, Bld: 157 mg/dL — ABNORMAL HIGH (ref 65–99)
POTASSIUM: 5.2 mmol/L — AB (ref 3.5–5.1)
Sodium: 141 mmol/L (ref 135–145)

## 2018-01-18 LAB — GLUCOSE, CAPILLARY
GLUCOSE-CAPILLARY: 162 mg/dL — AB (ref 65–99)
GLUCOSE-CAPILLARY: 200 mg/dL — AB (ref 65–99)
GLUCOSE-CAPILLARY: 119 mg/dL — AB (ref 65–99)
Glucose-Capillary: 110 mg/dL — ABNORMAL HIGH (ref 65–99)

## 2018-01-18 MED ORDER — DOPAMINE-DEXTROSE 3.2-5 MG/ML-% IV SOLN
3.0000 ug/kg/min | INTRAVENOUS | Status: DC
  Administered 2018-01-18: 3 ug/kg/min via INTRAVENOUS
  Filled 2018-01-18: qty 250

## 2018-01-18 MED ORDER — FUROSEMIDE 10 MG/ML IJ SOLN
40.0000 mg | Freq: Once | INTRAMUSCULAR | Status: AC
  Administered 2018-01-18: 40 mg via INTRAVENOUS
  Filled 2018-01-18: qty 4

## 2018-01-18 NOTE — Progress Notes (Signed)
Patient has only 40 cc urine output for 4 hours,V/S is stable,Dr.Dickson made aware,IV fluid increase to 75 cc/hr per TVO (Joy OR RN) as ordered by Dr.Dickson.Will continue to monitor.

## 2018-01-18 NOTE — Anesthesia Postprocedure Evaluation (Signed)
Anesthesia Post Note  Patient: [name]  Procedure(s) Performed: ABDOMINAL AORTIC ENDOVASCULAR STENT GRAFT WITH CO2 (N/A Abdomen)     Patient location during evaluation: PACU Anesthesia Type: General Level of consciousness: awake and alert Pain management: pain level controlled Vital Signs Assessment: post-procedure vital signs reviewed and stable Respiratory status: spontaneous breathing, nonlabored ventilation and respiratory function stable Cardiovascular status: blood pressure returned to baseline and stable Postop Assessment: no apparent nausea or vomiting Anesthetic complications: no    Last Vitals:  Vitals:   01/18/18 0506 01/18/18 0841  BP: (!) 169/64   Pulse: 95   Resp: 18   Temp: 36.6 C 36.9 C  SpO2: 100%     Last Pain:  Vitals:   01/18/18 0841  TempSrc: Oral  PainSc:                  Catalina Gravel

## 2018-01-18 NOTE — Progress Notes (Signed)
    Subjective  - POD #1, s/p emergent EVAR  C/o abd and back pain Had issues with hypo/hyper tension intra-op and post op Received 2 U prbc post op Decreased UOP overnight  Physical Exam:  abd soft but tender Groin access sites ok Good PT doppler signals       Assessment/Plan:  POD #1  Acute on chronic renal disease:  Lasix given last night.  Renal contacted.  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  This should resolve over time Acute blood loss anemia:  I doubt she is actively bleeding.  Will continue to monitor Hb.  If there is a significant drop,I will re CT scan without contrast tomorrow  [name] 01/18/2018 8:41 AM --  Vitals:   01/17/18 2212 01/18/18 0506  BP: (!) 130/52 (!) 169/64  Pulse:  95  Resp:  18  Temp:  97.8 F (36.6 C)  SpO2:  100%    Intake/Output Summary (Last 24 hours) at 01/18/2018 0841 Last data filed at 01/18/2018 0654 Gross per 24 hour  Intake 3960 ml  Output 475 ml  Net 3485 ml     Laboratory CBC    Component Value Date/Time   WBC 24.6 (H) 01/18/2018 0445   HGB 11.3 (L) 01/18/2018 0445   HCT 33.3 (L) 01/18/2018 0445   PLT 200 01/18/2018 0445    BMET    Component Value Date/Time   NA 141 01/18/2018 0445   K 5.2 (H) 01/18/2018 0445   CL 108 01/18/2018 0445   CO2 23 01/18/2018 0445   GLUCOSE 157 (H) 01/18/2018 0445   BUN 27 (H) 01/18/2018 0445   CREATININE 2.65 (H) 01/18/2018 0445   CREATININE 1.32 (H) 06/17/2015 1257   CALCIUM 7.8 (L) 01/18/2018 0445   GFRNONAA 18 (L) 01/18/2018 0445   GFRAA 21 (L) 01/18/2018 0445    COAG Lab Results  Component Value Date   INR 1.22 01/17/2018   INR 1.05 01/17/2018   INR 0.97 07/03/2015   No results found for: PTT  Antibiotics Anti-infectives (From admission, onward)   Start     Dose/Rate Route Frequency Ordered Stop   01/18/18 0300  ceFAZolin (ANCEF)  IVPB 2g/100 mL premix     2 g 200 mL/hr over 30 Minutes Intravenous Every 8 hours 01/17/18 2134 01/18/18 1859   01/17/18 0600  ceFAZolin (ANCEF) IVPB 1 g/50 mL premix  Status:  Discontinued    Note to Pharmacy:  Send with pt to OR   1 g 100 mL/hr over 30 Minutes Intravenous On call 01/16/18 2055 01/17/18 2141       V. Leia Alf, M.D. [facility] Office: [phone] Pager:  [phone]

## 2018-01-18 NOTE — Progress Notes (Signed)
Inpatient Diabetes Program Recommendations  AACE/ADA: New Consensus Statement on Inpatient Glycemic Control (2015)  Target Ranges:  Prepandial:   less than 140 mg/dL      Peak postprandial:   less than 180 mg/dL (1-2 hours)      Critically ill patients:  140 - 180 mg/dL    Review of Glycemic Control  Diabetes history: DM 2 Outpatient Diabetes medications: Glipizide 2.5 mg Daily Current orders for Inpatient glycemic control: Novolog Moderate Correction 0-15 units tid + Glipizide 5 mg Daily  Inpatient Diabetes Program Recommendations:    Noted patient received steroids yesterday. Fasting glucose 162 mg/dl this am. Noted renal function elevated. Noted patient is also on Glipizide 2.5 mg at home not 5 mg.  Please consider decreasing Novolog Sensitive Correction 0-9 units due to renal function and decrease Glipizide to 2.5 mg Daily.  Thanks,  Tama Headings RN, MSN, BC-ADM, [facility] Inpatient Diabetes Coordinator Team Pager [phone] (8a-5p)

## 2018-01-18 NOTE — Consult Note (Signed)
[facility] Renal Consultation Note  Requesting MD: Brabham Indication for Consultation: AKI after graft repair of AAA  HPI:  [name] is a 67 y.o. female wit PMhx signif for HTN, DM, hyperlipidemia, CAD, cerebrovasc disease as well as CKD followed by Dr. Lorrene Reid at [facility]. She started to have abdominal pain in the setting of a known AAA so underwent stent graft repair of AAA on 5/14.  She had issues with hypertension alternating with hypotension after surgery- UOP poor- only 75 CCs recorded last 8 hours.  We are asked to consult for A on CRF and oliguria.  Creatinine on admit was 2.29 - is 2.65 today.  There is crt in the system from 2017 that was 2.17. In March crt 2.82  I do not have access to records per Dr. Posey Pronto- pt had an AVF placed on 11/03/17 just in preparation as GFR is less than 20  Creat  Date/Time Value Ref Range Status  06/17/2015 12:57 PM 1.32 (H) 0.50 - 0.99 mg/dL Final   Creatinine, Ser  Date/Time Value Ref Range Status  01/18/2018 04:45 AM 2.65 (H) 0.44 - 1.00 mg/dL Final  01/17/2018 10:00 PM 2.43 (H) 0.44 - 1.00 mg/dL Final  01/17/2018 03:31 AM 2.29 (H) 0.44 - 1.00 mg/dL Final  01/14/2018 12:00 PM 2.17 (H) 0.44 - 1.00 mg/dL Final  10/08/2015 05:35 AM 2.17 (H) 0.44 - 1.00 mg/dL Final  10/07/2015 01:55 AM 2.24 (H) 0.44 - 1.00 mg/dL Final  10/06/2015 05:49 PM 2.17 (H) 0.44 - 1.00 mg/dL Final  07/04/2015 01:21 PM 1.89 (H) 0.44 - 1.00 mg/dL Final  07/03/2015 11:51 PM 2.04 (H) 0.44 - 1.00 mg/dL Final  06/26/2015 04:45 AM 1.43 (H) 0.44 - 1.00 mg/dL Final  06/25/2015 04:15 AM 1.71 (H) 0.44 - 1.00 mg/dL Final  06/24/2015 06:42 AM 1.71 (H) 0.44 - 1.00 mg/dL Final  09/11/2013 06:47 AM 1.70 (H) 0.50 - 1.10 mg/dL Final  01/25/2013 12:11 PM 1.6 (H) 0.4 - 1.2 mg/dL Final  12/18/2012 11:15 AM 1.9 (H) 0.4 - 1.2 mg/dL Final  07/01/2011 01:51 PM 1.84 (H) 0.50 - 1.10 mg/dL Final  06/23/2011 04:30 PM 1.6 (H) 0.4 - 1.2 mg/dL Final  04/01/2011 03:21 PM 1.8 (H) 0.4 - 1.2  mg/dL Final  03/15/2011 04:03 AM 2.15 (H) 0.50 - 1.10 mg/dL Final    Comment:    **Please note change in reference range.**  03/14/2011 05:08 AM 2.25 (H) 0.50 - 1.10 mg/dL Final    Comment:    **Please note change in reference range.**  03/13/2011 06:30 AM 2.03 (H) 0.50 - 1.10 mg/dL Final    Comment:    **Please note change in reference range.**  03/12/2011 06:54 AM 1.90 (H) 0.50 - 1.10 mg/dL Final    Comment:    **Please note change in reference range.**  03/11/2011 04:55 AM 1.77 (H) 0.50 - 1.10 mg/dL Final    Comment:    **Please note change in reference range.**  03/09/2011 05:10 AM 1.46 (H) 0.50 - 1.10 mg/dL Final    Comment:    **Please note change in reference range.**  03/08/2011 04:08 AM 1.77 (H) 0.50 - 1.10 mg/dL Final    Comment:    **Please note change in reference range.**  03/07/2011 04:30 AM 1.74 (H) 0.50 - 1.10 mg/dL Final    Comment:    **Please note change in reference range.**  03/06/2011 04:40 PM 1.90 (H) 0.50 - 1.10 mg/dL Final  03/06/2011 04:40 PM 1.64 (H) 0.50 - 1.10 mg/dL Final  Comment:    **Please note change in reference range.**  03/06/2011 04:24 AM 1.48 (H) 0.50 - 1.10 mg/dL Final    Comment:    **Please note change in reference range.**  03/05/2011 09:09 PM 1.50 (H) 0.50 - 1.10 mg/dL Final  03/05/2011 09:05 PM 1.37 (H) 0.50 - 1.10 mg/dL Final    Comment:    **Please note change in reference range.** DELTA CHECK NOTED  03/05/2011 04:46 AM 2.10 (H) 0.50 - 1.10 mg/dL Final    Comment:    **Please note change in reference range.**  03/04/2011 09:20 AM 1.97 (H) 0.50 - 1.10 mg/dL Final    Comment:    **Please note change in reference range.**  03/03/2011 04:21 PM 2.18 (H) 0.50 - 1.10 mg/dL Final    Comment:    **Please note change in reference range.**  03/03/2011 11:48 AM 2.1 (H) 0.4 - 1.2 mg/dL Final  02/26/2011 01:53 PM 2.7 (H) 0.4 - 1.2 mg/dL Final  07/17/2010 03:50 PM 1.7 (H) 0.4 - 1.2 mg/dL Final  12/23/2008 10:39 PM 1.80 (H) 0.40 - 1.20  mg/dL Final  11/21/2008 09:40 PM 1.80 (H) 0.40 - 1.20 mg/dL Final  09/03/2008 10:24 PM 1.54 (H) 0.40 - 1.20 mg/dL Final  08/16/2008 09:57 PM 1.57 (H) 0.40 - 1.20 mg/dL Final  05/07/2008 01:01 PM 1.2 0.4 - 1.2 mg/dL Final  03/05/2008 09:59 AM 1.3 (H) 0.4 - 1.2 mg/dL Final  02/03/2008 04:30 AM 1.40 (H)  Final  01/31/2008 04:03 PM 1.33 (H)  Final  01/24/2008 10:15 AM 1.2 0.4 - 1.2 mg/dL Final  11/04/2007 06:15 AM 1.55 (H)  Final  11/03/2007 04:30 AM 1.42 (H)  Final  11/02/2007 03:55 AM 1.32 (H)  Final  11/01/2007 11:35 AM 1.31 (H)  Final     PMHx:   Past Medical History:  Diagnosis Date  . AAA (abdominal aortic aneurysm) ([place]) 5/09   3.7x3.3 by u/s 2009  . Abnormal EKG    deep TW inversions chronic  . Anemia   . CAD (coronary artery disease)    a. CABG 03/2011 LIMA to LAD, SVG to OM, SVG to PDA. b. cath 06/25/2015 DES to SVG to rPDA, patent LIMA to LAD, patent SVG to OM  . carotid stenosis 5/09   S/p L CEA ;  60-79% bilat ICA by preCABG dopplers 7/12  . CHF (congestive heart failure) ([place])   . Chronic back pain    "all over" (06/24/2015)  . Chronic bronchitis ([place])    "get it pretty much q yr" (06/24/2015)  . CKD (chronic kidney disease)   . Complication of anesthesia 1966   "problem w/ether"  . COPD (chronic obstructive pulmonary disease) ([place])   . Coronary artery disease 5/09   s/p CABG 7/12   L-LAD, S-OM, S-PDA  . Depression   . GERD (gastroesophageal reflux disease)    With hiatal hernia  . GIB (gastrointestinal bleeding) 9/11   S/p EGD with cautery at [facility]  . Heart murmur   . History of blood transfusion "a few times"   "all related to anemia" (06/24/2015)  . History of hiatal hernia   . Hyperlipidemia   . Hypertension   . Mitral regurgitation    3+ MR by intraoperative TEE;  s/p MV repair with Dr. Roxy Manns 7/12  . Myocardial infarction [facility]) 2012 "several"  . On home oxygen therapy    "2 liters at night; negative for sleep apnea"  . PAD (peripheral artery disease)  ([facility])    Severe; s/p bilateral renal  artery stents, moderate in-stent restenosis  . Paroxysmal atrial fibrillation ([facility])    coumadin;  echo 9/07: EF 60%, mild LVH;  s/p Cox Maze 7/12 with LAA clipping  . Subclavian artery stenosis, left ([facility])    stented by Dr. Trula Slade on 10/18 to help flow of her LIMA to LAD  . Type II diabetes mellitus ([place])     Past Surgical History:  Procedure Laterality Date  . ABDOMINAL AORTIC ENDOVASCULAR STENT GRAFT N/A 01/17/2018   Procedure: ABDOMINAL AORTIC ENDOVASCULAR STENT GRAFT WITH CO2;  Surgeon: Serafina Mitchell, MD;  Location: [place];  Service: Vascular;  Laterality: N/A;  . ABDOMINAL HYSTERECTOMY  1990's  . APPENDECTOMY  Aug. 11, 2016   Ruptured  . AV FISTULA PLACEMENT Left 11/03/2017   Procedure: ARTERIOVENOUS (AV) FISTULA CREATION LEFT ARM;  Surgeon: Serafina Mitchell, MD;  Location: [place];  Service: Vascular;  Laterality: Left;  . BOWEL RESECTION  2013  . CARDIAC CATHETERIZATION N/A 06/24/2015   Procedure: Left Heart Cath and Cors/Grafts Angiography;  Surgeon: Leonie Man, MD;  Location: [facility];  Service: Cardiovascular;  Laterality: N/A;  . CARDIAC CATHETERIZATION N/A 06/25/2015   Procedure: Coronary Stent Intervention;  Surgeon: Sherren Mocha, MD;  Location: [facility];  Service: Cardiovascular;  Laterality: N/A;  . CAROTID ENDARTERECTOMY Left   . CORONARY ANGIOPLASTY    . CORONARY ARTERY BYPASS GRAFT  03/05/2011   CABG X3 (LIMA to LAD, SVG to OM, SVG to PDA, EVH via left thigh  . CORONARY STENT PLACEMENT    . LACERATION REPAIR Right 1990's   WRIST  . MAZE  03/05/2011   complete biatrial lesion set with clipping of LA appendage  . MITRAL VALVE REPAIR  03/05/2011   19m Memo 3D ring annuloplasty for ischemic MR  . PERIPHERAL VASCULAR CATHETERIZATION N/A 06/24/2015   Procedure: Aortic Arch Angiography;  Surgeon: VSerafina Mitchell MD;  [facility];  Service: Cardiovascular;  Laterality: N/A;  . PERIPHERAL  VASCULAR CATHETERIZATION  06/24/2015   Procedure: Peripheral Vascular Intervention;  Surgeon: VSerafina Mitchell MD;  [facility];  Service: Cardiovascular;;  . PERIPHERAL VASCULAR CATHETERIZATION N/A 06/24/2015   Procedure: Abdominal Aortogram;  Surgeon: VSerafina Mitchell MD;  [facility];  Service: Cardiovascular;  Laterality: N/A;  . RENAL ANGIOGRAM Bilateral 09/11/2013   Procedure: RENAL ANGIOGRAM;  Surgeon: VSerafina Mitchell MD;  [facility];  Service: Cardiovascular;  Laterality: Bilateral;  . RIGHT FEMORAL-POPLITEAL BYPASS      Family Hx:  Family History  Problem Relation Age of Onset  . Emphysema Mother   . Heart disease Mother        before age 65 . Hypertension Mother   . Hyperlipidemia Mother   . Heart attack Mother   . AAA (abdominal aortic aneurysm) Mother        rupture  . Heart attack Father 726 . Emphysema Father   . Heart disease Father        before age 65 . Hyperlipidemia Father   . Hypertension Father   . Peripheral vascular disease Father   . Diabetes Brother   . Heart disease Brother        before age 65 . Hyperlipidemia Brother   . Hypertension Brother   . Heart attack Brother        CABG  . Heart disease Other        Vascular disease in  grandparents, uncles and dad    Social History:  reports that she has been smoking cigarettes.  She has a 50.00 pack-year smoking history. She has never used smokeless tobacco. She reports that she drinks alcohol. She reports that she has current or past drug history. Drug: Marijuana.  Allergies:  Allergies  Allergen Reactions  . Isosorbide     UNSPECIFIED REACTION - okay in low doses  . Codeine Itching  . Contrast Media [Iodinated Diagnostic Agents] Itching and Other (See Comments)    Itching of feet  . Ioxaglate Itching and Other (See Comments)    Itching of feet  . Metrizamide Itching and Other (See Comments)    Itching of feet  . Percocet [Oxycodone-Acetaminophen]  Itching and Other (See Comments)    Tolerates Hydrocodone    Medications: Prior to Admission medications   Medication Sig Start Date End Date Taking? Authorizing Provider  albuterol (PROVENTIL HFA;VENTOLIN HFA) 108 (90 Base) MCG/ACT inhaler Inhale 2 puffs into the lungs every 4 (four) hours as needed for wheezing or shortness of breath. ProAir 10/08/15  Yes Delfina Redwood, MD  allopurinol (ZYLOPRIM) 100 MG tablet Take 100 mg by mouth daily. 02/02/17  Yes [provider]  ALPRAZolam Duanne Moron) 0.5 MG tablet Take 0.25 mg by mouth at bedtime as needed for anxiety.  10/16/15  Yes [provider]  amLODipine (NORVASC) 10 MG tablet Take 10 mg by mouth daily.     Yes [provider]  atorvastatin (LIPITOR) 40 MG tablet Take 40 mg by mouth every evening.  12/07/17  Yes [provider]  carvedilol (COREG) 25 MG tablet TAKE 1 TABLET BY MOUTH 2 TIMES A DAY WITH A MEAL Patient taking differently: TAKE 25 MG BY MOUTH 2 TIMES A DAY WITH A MEAL 11/04/15  Yes Truitt Merle C, NP  Cholecalciferol (VITAMIN D3) 5000 units TABS Take 5,000 Units by mouth daily.   Yes [provider]  cloNIDine (CATAPRES) 0.2 MG tablet Take 1 tablet (0.2 mg total) by mouth 3 (three) times daily. 07/10/15  Yes Richardson Dopp T, PA-C  clopidogrel (PLAVIX) 75 MG tablet Take 1 tablet (75 mg total) by mouth daily with breakfast. 06/26/15  Yes Almyra Deforest, PA  DULoxetine (CYMBALTA) 60 MG capsule Take one capsule (60 mg dose) by mouth daily. 10/26/17  Yes [provider]  famotidine (PEPCID) 20 MG tablet TAKE 1 TABLET (20 MG TOTAL) BY MOUTH DAILY BEFORE SUPPER. 01/27/16  Yes Sherren Mocha, MD  fenofibrate 160 MG tablet Take 1 tablet (160 mg total) by mouth daily. 10/08/15  Yes Delfina Redwood, MD  ferrous sulfate 325 (65 FE) MG tablet Take 325 mg by mouth 2 (two) times daily with a meal.   Yes [provider]  furosemide (LASIX) 40 MG tablet Take 40 mg by mouth daily. May take an  additional tablet day as needed for weight gain  05/30/17  Yes [provider]  glipiZIDE (GLUCOTROL) 5 MG tablet Take one half tablet (2.5 mg dose) by mouth daily. 12/06/17  Yes [provider]  hydrALAZINE (APRESOLINE) 100 MG tablet Take 100 mg by mouth 3 (three) times daily.   Yes [provider]  HYDROcodone-acetaminophen (NORCO/VICODIN) 5-325 MG tablet Take 1 tablet by mouth every 8 (eight) hours as needed for moderate pain. 11/03/17  Yes Rhyne, Hulen Shouts, PA-C  isosorbide mononitrate (IMDUR) 30 MG 24 hr tablet Take 0.5 tablets (15 mg total) by mouth daily. 12/02/15  Yes Sherren Mocha, MD  magic mouthwash SOLN Take  15 mLs by mouth 4 (four) times daily as needed for mouth pain.  10/23/17  Yes [provider]  Omega-3 Fatty Acids (FISH OIL) 1000 MG CAPS Take 1,000 mg by mouth 2 (two) times daily.   Yes [provider]  pantoprazole (PROTONIX) 40 MG tablet Take 1 tablet (40 mg total) by mouth daily. 06/26/15  Yes Almyra Deforest, PA  Potassium 99 MG TABS Take 99 mg by mouth daily.    Yes [provider]  zolpidem (AMBIEN) 10 MG tablet Take 10 mg by mouth at bedtime.   Yes [provider]    I have reviewed the patient's current medications.  Labs:  Results for orders placed or performed during the hospital encounter of 01/16/18 (from the past 48 hour(s))  CBG monitoring, ED     Status: Abnormal   Collection Time: 01/16/18 10:45 PM  Result Value Ref Range   Glucose-Capillary 132 (H) 65 - 99 mg/dL  Urinalysis, Routine w reflex microscopic     Status: None   Collection Time: 01/16/18 11:55 PM  Result Value Ref Range   Color, Urine YELLOW YELLOW   APPearance CLEAR CLEAR   Specific Gravity, Urine 1.009 1.005 - 1.030   pH 6.0 5.0 - 8.0   Glucose, UA NEGATIVE NEGATIVE mg/dL   Hgb urine dipstick NEGATIVE NEGATIVE   Bilirubin Urine NEGATIVE NEGATIVE   Ketones, ur NEGATIVE NEGATIVE mg/dL   Protein, ur NEGATIVE NEGATIVE mg/dL   Nitrite  NEGATIVE NEGATIVE   Leukocytes, UA NEGATIVE NEGATIVE    Comment: Performed at [facility] [address] [place], [place] [address]  Surgical PCR screen     Status: None   Collection Time: 01/16/18 11:59 PM  Result Value Ref Range   MRSA, PCR NEGATIVE NEGATIVE   Staphylococcus aureus NEGATIVE NEGATIVE    Comment: (NOTE) The Xpert SA Assay (FDA approved for NASAL specimens in patients 37 years of age and older), is one component of a comprehensive surveillance program. It is not intended to diagnose infection nor to guide or monitor treatment. Performed at [facility], [place] [address], [place], [place] [address]   HIV antibody (Routine Testing)     Status: None   Collection Time: 01/17/18  3:31 AM  Result Value Ref Range   HIV Screen 4th Generation wRfx Non Reactive Non Reactive    Comment: (NOTE) Performed At: [facility] [place], [place] [id number] Rush Farmer MD [id number] Performed at [facility], [place] [address], [place], [place] [address]   CBC     Status: Abnormal   Collection Time: 01/17/18  3:31 AM  Result Value Ref Range   WBC 7.9 4.0 - 10.5 K/uL   RBC 2.77 (L) 3.87 - 5.11 MIL/uL   Hemoglobin 8.7 (L) 12.0 - 15.0 g/dL   HCT 26.9 (L) 36.0 - 46.0 %   MCV 97.1 78.0 - 100.0 fL   MCH 31.4 26.0 - 34.0 pg   MCHC 32.3 30.0 - 36.0 g/dL   RDW 14.9 11.5 - 15.5 %   Platelets 268 150 - 400 K/uL    Comment: Performed at [facility], [place] [address] [place], [place] [address]  Comprehensive metabolic panel     Status: Abnormal   Collection Time: 01/17/18  3:31 AM  Result Value Ref Range   Sodium 146 (H) 135 - 145 mmol/L   Potassium 3.9 3.5 - 5.1 mmol/L   Chloride 107 101 - 111 mmol/L  CO2 28 22 - 32 mmol/L   Glucose, Bld 121 (H) 65 - 99 mg/dL   BUN 20 6 - 20 mg/dL   Creatinine, Ser 2.29 (H) 0.44 - 1.00 mg/dL   Calcium 8.6 (L) 8.9 - 10.3 mg/dL   Total Protein 5.2 (L) 6.5 - 8.1 g/dL   Albumin 2.6 (L) 3.5 - 5.0 g/dL    AST 13 (L) 15 - 41 U/L   ALT 9 (L) 14 - 54 U/L   Alkaline Phosphatase 42 38 - 126 U/L   Total Bilirubin 0.5 0.3 - 1.2 mg/dL   GFR calc non Af Amer 21 (L) >60 mL/min   GFR calc Af Amer 25 (L) >60 mL/min    Comment: (NOTE) The eGFR has been calculated using the CKD EPI equation. This calculation has not been validated in all clinical situations. eGFR's persistently <60 mL/min signify possible Chronic Kidney Disease.    Anion gap 11 5 - 15    Comment: Performed at [facility] [address], [place], [place] [address]  Protime-INR     Status: None   Collection Time: 01/17/18  3:31 AM  Result Value Ref Range   Prothrombin Time 13.6 11.4 - 15.2 seconds   INR 1.05     Comment: Performed at [facility] [address], [place], [place] [address]  Glucose, capillary     Status: Abnormal   Collection Time: 01/17/18  6:00 AM  Result Value Ref Range   Glucose-Capillary 120 (H) 65 - 99 mg/dL  Glucose, capillary     Status: Abnormal   Collection Time: 01/17/18  1:31 PM  Result Value Ref Range   Glucose-Capillary 117 (H) 65 - 99 mg/dL  Type and screen [place]     Status: None   Collection Time: 01/17/18  2:20 PM  Result Value Ref Range   ABO/RH(D) B POS    Antibody Screen NEG    Sample Expiration 01/20/2018    Unit Number [id number]    Blood Component Type RED CELLS,LR    Unit division 00    Status of Unit ISSUED,FINAL    Transfusion Status OK TO TRANSFUSE    Crossmatch Result      Compatible Performed at [facility], [place] [address], [place], [place] [address]    Unit Number [id number]    Blood Component Type RED CELLS,LR    Unit division 00    Status of Unit ISSUED,FINAL    Transfusion Status OK TO TRANSFUSE    Crossmatch Result Compatible    Unit Number [id number]    Blood Component Type RED CELLS,LR    Unit division 00    Status of Unit ISSUED,FINAL    Transfusion Status OK TO TRANSFUSE    Crossmatch Result Compatible    Prepare RBC     Status: None   Collection Time: 01/17/18  3:45 PM  Result Value Ref Range   Order Confirmation      ORDER PROCESSED BY BLOOD BANK Performed at [facility], [place] [address], [place], [place] [address]   Glucose, capillary     Status: Abnormal   Collection Time: 01/17/18  7:14 PM  Result Value Ref Range   Glucose-Capillary 193 (H) 65 - 99 mg/dL   Comment 1 Notify RN    Comment 2 Document in Chart   Prepare RBC     Status: None   Collection Time: 01/17/18  7:48 PM  Result Value Ref Range  Order Confirmation      ORDER PROCESSED BY BLOOD BANK Performed at [facility], [place] [address], [place], [place] [address]   Glucose, capillary     Status: Abnormal   Collection Time: 01/17/18  9:49 PM  Result Value Ref Range   Glucose-Capillary 233 (H) 65 - 99 mg/dL  CBC     Status: Abnormal   Collection Time: 01/17/18 10:00 PM  Result Value Ref Range   WBC 21.1 (H) 4.0 - 10.5 K/uL   RBC 3.93 3.87 - 5.11 MIL/uL   Hemoglobin 11.6 (L) 12.0 - 15.0 g/dL    Comment: DELTA CHECK NOTED POST TRANSFUSION SPECIMEN    HCT 35.1 (L) 36.0 - 46.0 %   MCV 89.3 78.0 - 100.0 fL    Comment: DELTA CHECK NOTED POST TRANSFUSION SPECIMEN    MCH 29.5 26.0 - 34.0 pg   MCHC 33.0 30.0 - 36.0 g/dL   RDW 16.6 (H) 11.5 - 15.5 %   Platelets 203 150 - 400 K/uL    Comment: Performed at [facility], [place] [address] [place], [place] [address]  Basic metabolic panel     Status: Abnormal   Collection Time: 01/17/18 10:00 PM  Result Value Ref Range   Sodium 141 135 - 145 mmol/L   Potassium 5.8 (H) 3.5 - 5.1 mmol/L    Comment: REPEATED TO VERIFY NO VISIBLE HEMOLYSIS DELTA CHECK NOTED    Chloride 108 101 - 111 mmol/L   CO2 21 (L) 22 - 32 mmol/L   Glucose, Bld 244 (H) 65 - 99 mg/dL   BUN 21 (H) 6 - 20 mg/dL   Creatinine, Ser 2.43 (H) 0.44 - 1.00 mg/dL   Calcium 7.7 (L) 8.9 - 10.3 mg/dL   GFR calc non Af Amer 20 (L) >60 mL/min   GFR calc Af Amer 23 (L) >60 mL/min    Comment:  (NOTE) The eGFR has been calculated using the CKD EPI equation. This calculation has not been validated in all clinical situations. eGFR's persistently <60 mL/min signify possible Chronic Kidney Disease.    Anion gap 12 5 - 15    Comment: Performed at [facility] [address], [place], [place] [address]  Protime-INR     Status: Abnormal   Collection Time: 01/17/18 10:00 PM  Result Value Ref Range   Prothrombin Time 15.3 (H) 11.4 - 15.2 seconds   INR 1.22     Comment: Performed at [facility] [address], [place], [place] [address]  APTT     Status: None   Collection Time: 01/17/18 10:00 PM  Result Value Ref Range   aPTT 36 24 - 36 seconds    Comment: Performed at [facility] [address] [place], [place] [address]  Magnesium     Status: Abnormal   Collection Time: 01/17/18 10:00 PM  Result Value Ref Range   Magnesium 1.5 (L) 1.7 - 2.4 mg/dL    Comment: Performed at [facility] [address], [place] [address]  CBC     Status: Abnormal   Collection Time: 01/18/18  4:45 AM  Result Value Ref Range   WBC 24.6 (H) 4.0 - 10.5 K/uL   RBC 3.78 (L) 3.87 - 5.11 MIL/uL   Hemoglobin 11.3 (L) 12.0 - 15.0 g/dL   HCT 33.3 (L) 36.0 - 46.0 %   MCV 88.1 78.0 - 100.0 fL   MCH 29.9 26.0 - 34.0 pg  MCHC 33.9 30.0 - 36.0 g/dL   RDW 17.4 (H) 11.5 - 15.5 %   Platelets 200 150 - 400 K/uL    Comment: Performed at [facility], [place] [address], [place], [place] [address]  Basic metabolic panel     Status: Abnormal   Collection Time: 01/18/18  4:45 AM  Result Value Ref Range   Sodium 141 135 - 145 mmol/L   Potassium 5.2 (H) 3.5 - 5.1 mmol/L   Chloride 108 101 - 111 mmol/L   CO2 23 22 - 32 mmol/L   Glucose, Bld 157 (H) 65 - 99 mg/dL   BUN 27 (H) 6 - 20 mg/dL   Creatinine, Ser 2.65 (H) 0.44 - 1.00 mg/dL   Calcium 7.8 (L) 8.9 - 10.3 mg/dL   GFR calc non Af Amer 18 (L) >60 mL/min   GFR calc Af Amer 21 (L) >60 mL/min    Comment: (NOTE) The  eGFR has been calculated using the CKD EPI equation. This calculation has not been validated in all clinical situations. eGFR's persistently <60 mL/min signify possible Chronic Kidney Disease.    Anion gap 10 5 - 15    Comment: Performed at [facility] [address], [place],  [address]  Glucose, capillary     Status: Abnormal   Collection Time: 01/18/18  6:18 AM  Result Value Ref Range   Glucose-Capillary 162 (H) 65 - 99 mg/dL  Glucose, capillary     Status: Abnormal   Collection Time: 01/18/18 11:37 AM  Result Value Ref Range   Glucose-Capillary 200 (H) 65 - 99 mg/dL   Comment 1 Notify RN    Comment 2 Document in Chart      ROS:  A comprehensive review of systems was negative except for: Gastrointestinal: positive for abdominal pain, change in bowel habits and nausea  Physical Exam: Vitals:   01/18/18 0841 01/18/18 1148  BP:  (!) 178/52  Pulse:    Resp:    Temp: 98.4 F (36.9 C)   SpO2:       General: thin WF- looks about her age- some abdominal pain HEENT: PERRLA, EOMI- membranes moist Neck: no JVD Heart: RRR Lungs: mostly clear, some coughing Abdomen: soft, diffusely tender with dec BS Extremities: no edema- said her left big toe was blue- slightly dusky but not that cool- left groin stick with just a little bruising  Skin: warm and dry Neuro: alert, non focal  Left upper arm AVF- good thrill and bruit, slightly small Foley cath with at least 200 of urine  Assessment/Plan: 67 year old WF- DM, HTN , 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  Now with some oliguria in the setting of post op from stent graft repair of AAA-  No absolute indications for HD- will follow UOP and labs closely- do not think needs labs before the AM not sure if AVF would be usable 2. Hypertension/volume  - appears to have diff to  control BP at baseline given her med list- currently on norvasc 10/clonidine 0.2 TID/hydralazine 100 TID- at home on coreg as well.  Also on low dose dopamine possibly for renal perfusion- not causing tachycardia so OK to keep.  Also kept on lasix oral - given one dose IV early this AM-  volume status does not seem too heavy- will actually hold lasix but follow  closely for need   3. Anemia  - hgb seems stable since surgery- no action needed 4. Hyperkalemia- mild, follow  5. AAA- s/p stent graft repair- per [facility]- possibly an ileus or some evidence of CES- but improved- follow clinical course    Darlys Buis A 01/18/2018, 2:56 PM

## 2018-01-19 ENCOUNTER — Inpatient Hospital Stay (HOSPITAL_COMMUNITY): Payer: Medicare Other

## 2018-01-19 DIAGNOSIS — J449 Chronic obstructive pulmonary disease, unspecified: Secondary | ICD-10-CM | POA: Diagnosis present

## 2018-01-19 DIAGNOSIS — R0902 Hypoxemia: Secondary | ICD-10-CM

## 2018-01-19 DIAGNOSIS — R112 Nausea with vomiting, unspecified: Secondary | ICD-10-CM | POA: Diagnosis present

## 2018-01-19 DIAGNOSIS — E785 Hyperlipidemia, unspecified: Secondary | ICD-10-CM | POA: Diagnosis present

## 2018-01-19 DIAGNOSIS — E162 Hypoglycemia, unspecified: Secondary | ICD-10-CM | POA: Diagnosis present

## 2018-01-19 DIAGNOSIS — I5032 Chronic diastolic (congestive) heart failure: Secondary | ICD-10-CM | POA: Diagnosis present

## 2018-01-19 DIAGNOSIS — E119 Type 2 diabetes mellitus without complications: Secondary | ICD-10-CM

## 2018-01-19 DIAGNOSIS — D62 Acute posthemorrhagic anemia: Secondary | ICD-10-CM | POA: Diagnosis present

## 2018-01-19 DIAGNOSIS — I10 Essential (primary) hypertension: Secondary | ICD-10-CM | POA: Diagnosis present

## 2018-01-19 DIAGNOSIS — K683 Retroperitoneal hematoma: Secondary | ICD-10-CM | POA: Diagnosis present

## 2018-01-19 DIAGNOSIS — N184 Chronic kidney disease, stage 4 (severe): Secondary | ICD-10-CM

## 2018-01-19 DIAGNOSIS — I2510 Atherosclerotic heart disease of native coronary artery without angina pectoris: Secondary | ICD-10-CM | POA: Diagnosis present

## 2018-01-19 DIAGNOSIS — K661 Hemoperitoneum: Secondary | ICD-10-CM

## 2018-01-19 LAB — POCT I-STAT 7, (LYTES, BLD GAS, ICA,H+H)
ACID-BASE DEFICIT: 5 mmol/L — AB (ref 0.0–2.0)
BICARBONATE: 18.9 mmol/L — AB (ref 20.0–28.0)
Bicarbonate: 25.4 mmol/L (ref 20.0–28.0)
CALCIUM ION: 1.13 mmol/L — AB (ref 1.15–1.40)
Calcium, Ion: 1.13 mmol/L — ABNORMAL LOW (ref 1.15–1.40)
HCT: 25 % — ABNORMAL LOW (ref 36.0–46.0)
HCT: 20 % — ABNORMAL LOW (ref 36.0–46.0)
Hemoglobin: 8.5 g/dL — ABNORMAL LOW (ref 12.0–15.0)
Hemoglobin: 6.8 g/dL — CL (ref 12.0–15.0)
O2 Saturation: 95 %
O2 Saturation: 98 %
PH ART: 7.395 (ref 7.350–7.450)
PH ART: 7.418 (ref 7.350–7.450)
PO2 ART: 74 mmHg — AB (ref 83.0–108.0)
POTASSIUM: 4.2 mmol/L (ref 3.5–5.1)
POTASSIUM: 5.3 mmol/L — AB (ref 3.5–5.1)
SODIUM: 141 mmol/L (ref 135–145)
Sodium: 143 mmol/L (ref 135–145)
TCO2: 27 mmol/L (ref 22–32)
TCO2: 20 mmol/L — ABNORMAL LOW (ref 22–32)
pCO2 arterial: 41.5 mmHg (ref 32.0–48.0)
pCO2 arterial: 29.3 mmHg — ABNORMAL LOW (ref 32.0–48.0)
pO2, Arterial: 96 mmHg (ref 83.0–108.0)

## 2018-01-19 LAB — PREPARE RBC (CROSSMATCH)

## 2018-01-19 LAB — BASIC METABOLIC PANEL
Anion gap: 12 (ref 5–15)
BUN: 40 mg/dL — ABNORMAL HIGH (ref 6–20)
CHLORIDE: 107 mmol/L (ref 101–111)
CO2: 23 mmol/L (ref 22–32)
CREATININE: 3.86 mg/dL — AB (ref 0.44–1.00)
Calcium: 7.8 mg/dL — ABNORMAL LOW (ref 8.9–10.3)
GFR, EST AFRICAN AMERICAN: 13 mL/min — AB (ref >60)
GFR, EST NON AFRICAN AMERICAN: 11 mL/min — AB (ref >60)
GLUCOSE: 51 mg/dL — AB (ref 65–99)
Potassium: 4.4 mmol/L (ref 3.5–5.1)
Sodium: 142 mmol/L (ref 135–145)

## 2018-01-19 LAB — GLUCOSE, CAPILLARY
GLUCOSE-CAPILLARY: 50 mg/dL — AB (ref 65–99)
GLUCOSE-CAPILLARY: 57 mg/dL — AB (ref 65–99)
GLUCOSE-CAPILLARY: 151 mg/dL — AB (ref 65–99)
GLUCOSE-CAPILLARY: 49 mg/dL — AB (ref 65–99)
GLUCOSE-CAPILLARY: 202 mg/dL — AB (ref 65–99)
GLUCOSE-CAPILLARY: 57 mg/dL — AB (ref 65–99)
Glucose-Capillary: 52 mg/dL — ABNORMAL LOW (ref 65–99)
Glucose-Capillary: 70 mg/dL (ref 65–99)
Glucose-Capillary: 166 mg/dL — ABNORMAL HIGH (ref 65–99)
Glucose-Capillary: 75 mg/dL (ref 65–99)

## 2018-01-19 LAB — CBC
HEMATOCRIT: 26.1 % — AB (ref 36.0–46.0)
HEMOGLOBIN: 8.8 g/dL — AB (ref 12.0–15.0)
MCH: 30.1 pg (ref 26.0–34.0)
MCHC: 33.7 g/dL (ref 30.0–36.0)
MCV: 89.4 fL (ref 78.0–100.0)
Platelets: 177 10*3/uL (ref 150–400)
RBC: 2.92 MIL/uL — ABNORMAL LOW (ref 3.87–5.11)
RDW: 16.9 % — AB (ref 11.5–15.5)
WBC: 26.9 10*3/uL — ABNORMAL HIGH (ref 4.0–10.5)

## 2018-01-19 LAB — POCT ACTIVATED CLOTTING TIME
ACTIVATED CLOTTING TIME: 175 s
Activated Clotting Time: 219 seconds

## 2018-01-19 MED ORDER — BISACODYL 10 MG RE SUPP
10.0000 mg | Freq: Once | RECTAL | Status: AC
  Administered 2018-01-22: 10 mg via RECTAL
  Filled 2018-01-19: qty 1

## 2018-01-19 MED ORDER — ONDANSETRON HCL 4 MG/2ML IJ SOLN: 4.0000 mg | Freq: Four times a day (QID) | INTRAMUSCULAR | Status: DC | PRN

## 2018-01-19 MED ORDER — DEXTROSE 50 % IV SOLN
INTRAVENOUS | Status: AC
  Administered 2018-01-19: 25 mL
  Filled 2018-01-19: qty 50

## 2018-01-19 MED ORDER — GLUCOSE 40 % PO GEL
ORAL | Status: AC
  Administered 2018-01-19: 37.5 g
  Filled 2018-01-19: qty 1

## 2018-01-19 MED ORDER — FUROSEMIDE 10 MG/ML IJ SOLN
INTRAMUSCULAR | Status: AC
  Administered 2018-01-19: 20 mg via INTRAVENOUS
  Filled 2018-01-19: qty 2

## 2018-01-19 MED ORDER — FUROSEMIDE 10 MG/ML IJ SOLN
20.0000 mg | Freq: Once | INTRAMUSCULAR | Status: AC
  Administered 2018-01-19: 20 mg via INTRAVENOUS

## 2018-01-19 MED ORDER — DEXTROSE 5 % IV SOLN
INTRAVENOUS | Status: DC
  Administered 2018-01-19: 30 mL/h via INTRAVENOUS

## 2018-01-19 MED ORDER — GLIPIZIDE 5 MG PO TABS: 2.5000 mg | ORAL_TABLET | Freq: Every day | ORAL | Status: DC

## 2018-01-19 MED ORDER — DEXTROSE 10 % IV SOLN
INTRAVENOUS | Status: DC
  Administered 2018-01-19: 75 mL/h via INTRAVENOUS
  Administered 2018-01-20: 18:00:00 via INTRAVENOUS

## 2018-01-19 MED ORDER — FAMOTIDINE IN NACL 20-0.9 MG/50ML-% IV SOLN
20.0000 mg | Freq: Two times a day (BID) | INTRAVENOUS | Status: DC
  Administered 2018-01-19 – 2018-01-20 (×3): 20 mg via INTRAVENOUS
  Filled 2018-01-19 (×3): qty 50

## 2018-01-19 MED ORDER — INSULIN ASPART 100 UNIT/ML ~~LOC~~ SOLN: 0.0000 [IU] | Freq: Three times a day (TID) | SUBCUTANEOUS | Status: DC

## 2018-01-19 MED ORDER — CLONIDINE HCL 0.3 MG/24HR TD PTWK
0.3000 mg | MEDICATED_PATCH | TRANSDERMAL | Status: AC
  Administered 2018-01-19: 0.3 mg via TRANSDERMAL
  Filled 2018-01-19: qty 1

## 2018-01-19 MED ORDER — HYDRALAZINE HCL 20 MG/ML IJ SOLN
10.0000 mg | INTRAMUSCULAR | Status: DC | PRN
  Administered 2018-01-19 – 2018-01-27 (×8): 10 mg via INTRAVENOUS
  Filled 2018-01-19 (×8): qty 1

## 2018-01-19 MED ORDER — PROMETHAZINE HCL 25 MG/ML IJ SOLN
12.5000 mg | Freq: Four times a day (QID) | INTRAMUSCULAR | Status: DC | PRN
  Administered 2018-01-19 – 2018-01-20 (×3): 12.5 mg via INTRAVENOUS
  Filled 2018-01-19 (×3): qty 1

## 2018-01-19 MED ORDER — SODIUM CHLORIDE 0.9 % IV SOLN: Freq: Once | INTRAVENOUS | Status: DC

## 2018-01-19 MED ORDER — HYDRALAZINE HCL 20 MG/ML IJ SOLN
10.0000 mg | INTRAMUSCULAR | Status: AC | PRN
  Administered 2018-01-19: 10 mg via INTRAVENOUS

## 2018-01-19 MED ORDER — FUROSEMIDE 10 MG/ML IJ SOLN: 20.0000 mg | Freq: Once | INTRAMUSCULAR | Status: DC

## 2018-01-19 MED ORDER — METOCLOPRAMIDE HCL 5 MG/ML IJ SOLN
5.0000 mg | Freq: Three times a day (TID) | INTRAMUSCULAR | Status: DC
  Administered 2018-01-19 – 2018-01-27 (×24): 5 mg via INTRAVENOUS
  Filled 2018-01-19 (×24): qty 2

## 2018-01-19 MED ORDER — DARBEPOETIN ALFA 150 MCG/0.3ML IJ SOSY
150.0000 ug | PREFILLED_SYRINGE | INTRAMUSCULAR | Status: DC
  Administered 2018-01-19: 150 ug via SUBCUTANEOUS
  Filled 2018-01-19: qty 0.3

## 2018-01-19 NOTE — Progress Notes (Signed)
Subjective:  BP cont to be variable but taking on leg - on norvasc 10/hydral 100 TID and clonidine 0.2 TID- only 400 of UOP and creatinine up "I feel like crap"   Objective Vital signs in last 24 hours: Vitals:   01/18/18 1148 01/18/18 2030 01/18/18 2114 01/19/18 0555  BP: (!) 178/52  96/70 101/64  Pulse:   91 79  Resp:  16 20 18   Temp:   98.2 F (36.8 C) 98 F (36.7 C)  TempSrc:  Oral Oral Oral  SpO2:   97% 95%  Weight:    66.4 kg (146 lb 6.2 oz)  Height:       Weight change:   Intake/Output Summary (Last 24 hours) at 01/19/2018 1607 Last data filed at 01/19/2018 0615 Gross per 24 hour  Intake 370 ml  Output 400 ml  Net -30 ml   Assessment/Plan: 67 year old WF- DM, HTN , 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  Now with some oliguria in the setting of post op from stent graft repair of AAA-  No absolute indications for HD but trend is not good- will follow UOP and labs closely- do not think needs labs before the AM-- not sure if AVF would be usable as an aside.  Of note- pt had husband who started on dialysis and dies shortly thereafter so that is weighing heavy on her mind 2. Hypertension/volume  - appears to have diff to control BP at baseline given her med list- currently on norvasc 10/clonidine 0.2 TID/hydralazine 100 TID- at home on coreg as well.  Also on low dose dopamine possibly for renal perfusion- not causing tachycardia so OK to keep.  Also kept on lasix oral  That I stopped - but follow closely for need   3. Anemia  - s/p transfusion 5/14- hgb was up- now back down.  Supportive care but will also add ESA  4. Hyperkalemia- improved 5. AAA- s/p stent graft repair- per [facility]- possibly an ileus or some evidence of CES- - follow clinical course       Kazzandra Desaulniers A    Labs: Basic Metabolic Panel: Recent Labs  Lab  01/17/18 2200 01/18/18 0445 01/19/18 0432  NA 141 141 142  K 5.8* 5.2* 4.4  CL 108 108 107  CO2 21* 23 23  GLUCOSE 244* 157* 51*  BUN 21* 27* 40*  CREATININE 2.43* 2.65* 3.86*  CALCIUM 7.7* 7.8* 7.8*   Liver Function Tests: Recent Labs  Lab 01/17/18 0331  AST 13*  ALT 9*  ALKPHOS 42  BILITOT 0.5  PROT 5.2*  ALBUMIN 2.6*   No results for input(s): LIPASE, AMYLASE in the last 168 hours. No results for input(s): AMMONIA in the last 168 hours. CBC: Recent Labs  Lab 01/14/18 1200 01/17/18 0331 01/17/18 2200 01/18/18 0445 01/19/18 0432  WBC 9.3 7.9 21.1* 24.6* 26.9*  HGB 8.7* 8.7* 11.6* 11.3* 8.8*  HCT 27.3* 26.9* 35.1* 33.3* 26.1*  MCV 97.2 97.1 89.3 88.1 89.4  PLT 268 268 203 200 177   Cardiac Enzymes: No results for input(s): CKTOTAL, CKMB, CKMBINDEX, TROPONINI in the last 168 hours. CBG: Recent Labs  Lab 01/18/18 1137 01/18/18 1642 01/18/18 2111 01/19/18 0625 01/19/18 0659  GLUCAP 200* 119* 110* 50* 57*    Iron Studies: No results for input(s): IRON, TIBC, TRANSFERRIN, FERRITIN in the last 72 hours. Studies/Results: Dg  Chest Port 1 View  Result Date: 01/17/2018 CLINICAL DATA:  S/p AAA repair using bifurcation graft EXAM: PORTABLE CHEST 1 VIEW COMPARISON:  Chest x-ray dated 01/14/2018. FINDINGS: Mild cardiomegaly is stable. Overall cardiomediastinal silhouette is stable. Aortic atherosclerosis. Lungs are clear. No pleural effusion or pneumothorax seen. No acute or suspicious osseous finding. Median sternotomy wires appear intact and stable in alignment. IMPRESSION: No active disease.  No evidence of pneumonia or pulmonary edema. Electronically Signed   By: Franki Cabot M.D.   On: 01/17/2018 21:29   Medications: Infusions: . sodium chloride    . sodium chloride    . sodium chloride 30 mL/hr at 01/19/18 0615  . sodium chloride    . DOPamine 3 mcg/kg/min (01/19/18 0615)  . magnesium sulfate 1 - 4 g bolus IVPB      Scheduled Medications: . allopurinol   100 mg Oral Daily  . amLODipine  10 mg Oral Daily  . atorvastatin  40 mg Oral QPM  . bisacodyl  10 mg Rectal Once  . cholecalciferol  5,000 Units Oral Daily  . cloNIDine  0.2 mg Oral TID  . clopidogrel  75 mg Oral Q breakfast  . dextrose      . docusate sodium  100 mg Oral Daily  . DULoxetine  60 mg Oral Daily  . famotidine  20 mg Oral Daily  . fenofibrate  160 mg Oral Daily  . ferrous sulfate  325 mg Oral BID WC  . [START ON 01/20/2018] glipiZIDE  2.5 mg Oral QAC breakfast  . heparin  5,000 Units Subcutaneous Q8H  . hydrALAZINE  100 mg Oral TID  . insulin aspart  0-9 Units Subcutaneous TID WC  . isosorbide mononitrate  15 mg Oral Daily  . pantoprazole  40 mg Oral Daily  . zolpidem  5 mg Oral QHS    have reviewed scheduled and prn medications.  Physical Exam: General:  Does not look as bright as yesterday Heart: RRR Lungs: mostly clear Abdomen: diffusely tender Extremities: no edema- cyanosis on left more pronounced  Dialysis Access: left AVF  Placed 2/29- slightly small    01/19/2018,8:21 AM  LOS: 3 days

## 2018-01-19 NOTE — Progress Notes (Signed)
Catapres patch added to BP regimen--not tol POs and BP high Estanislado Emms, MD

## 2018-01-19 NOTE — Progress Notes (Addendum)
Called by radiology for results of CT scan: IMPRESSION: Interval stent graft repair of AAA. Aneurysm sac size now 4.9 cm maximally compared to 5.4 cm previously.  Large left retroperitoneal hematoma measuring 18 x 9.2 x 7.9 cm.  Small left pleural effusion and trace right pleural effusion. Bibasilar atelectasis.  Discussed with Dr. Trula Slade and we will transfuse 2 units of PRBC's. Check labs in am.   Discussed with Dr. Moshe Cipro and she is okay with transfusion.  Leontine Locket, [facility] 01/19/2018 10:36 AM

## 2018-01-19 NOTE — Progress Notes (Signed)
Hypoglycemic Event  CBG: 52  Treatment: D50 IV 25 mL  Symptoms: Hungry  Follow-up CBG: Time:8:32 AM  CBG Result:151  Possible Reasons for Event: inadequate meal intake  Comments/MD notified:yes     [name]

## 2018-01-19 NOTE — Progress Notes (Signed)
Called MD to make aware of CT results.  Pt is vomitting and unable to take medications by mouth. BP 179/61 automatically and 180/62 manually in right upper arm. Pt given IV labetalol and morphine. Abdominal pain continues in left upper and lower quadrants.  Bowel sounds audible in all quadrants. Recheck cbg=70.  Pt resting with call bell within reach.  Will continue to monitor at bedside. Payton Emerald, RN

## 2018-01-19 NOTE — Progress Notes (Addendum)
  Progress Note    01/19/2018 8:04 AM 2 Days Post-Op  Subjective:  Not feeling good today.  Says she hasn't been passing gas or had a BM since surgery.  Does not have an appetite.   Afebrile HR 70's-80's NSR 75'T-700'F systolic 74% [id number]   Vitals:   01/18/18 2114 01/19/18 0555  BP: 96/70 101/64  Pulse: 91 79  Resp: 20 18  Temp: 98.2 F (36.8 C) 98 F (36.7 C)  SpO2: 97% 95%    Physical Exam: Cardiac:  Regular Lungs:  Non labored Incisions:  Bilateral groins are clean and dry Extremities:  Good doppler signals bilateral DP/PT (significantly improved from immediately post op). Abdomen:  Soft; somewhat diffusely tender to palpation; +bowel sounds present but decreased.   CBC    Component Value Date/Time   WBC 26.9 (H) 01/19/2018 0432   RBC 2.92 (L) 01/19/2018 0432   HGB 8.8 (L) 01/19/2018 0432   HCT 26.1 (L) 01/19/2018 0432   PLT 177 01/19/2018 0432   MCV 89.4 01/19/2018 0432   MCH 30.1 01/19/2018 0432   MCHC 33.7 01/19/2018 0432   RDW 16.9 (H) 01/19/2018 0432   LYMPHSABS 1.1 02/26/2011 1353   MONOABS 0.8 02/26/2011 1353   EOSABS 0.5 02/26/2011 1353   BASOSABS 0.0 02/26/2011 1353    BMET    Component Value Date/Time   NA 142 01/19/2018 0432   K 4.4 01/19/2018 0432   CL 107 01/19/2018 0432   CO2 23 01/19/2018 0432   GLUCOSE 51 (L) 01/19/2018 0432   BUN 40 (H) 01/19/2018 0432   CREATININE 3.86 (H) 01/19/2018 0432   CREATININE 1.32 (H) 06/17/2015 1257   CALCIUM 7.8 (L) 01/19/2018 0432   GFRNONAA 11 (L) 01/19/2018 0432   GFRAA 13 (L) 01/19/2018 0432    INR    Component Value Date/Time   INR 1.22 01/17/2018 2200   INR 1.8 11/09/2010 1358     Intake/Output Summary (Last 24 hours) at 01/19/2018 0804 Last data filed at 01/19/2018 0615 Gross per 24 hour  Intake 370 ml  Output 400 ml  Net -30 ml     Assessment:  67 y.o. female is s/p:  EVAR  2 Days Post-Op  Plan: -pt with improved doppler signals bilateral feet with bilateral DP/PT; there is  some discoloration of the toes on the left foot since surgery but pt says this has improved. -she is not passing flatus nor had a BM since before surgery-slightly tender to palpation of abdomen; will give dulcolax supp this am; CT scan of abdomen today without contrast per Dr. Stephens Shire note yesterday. -acute anemia with drop in hgb again-will get stat CT abdomen/pelvis without contrast this am. -her back pain is somewhat improved -pt continues to have oliguria with increase in creatinine-appreciate renal recommendations -leukocytosis-pt is afebrile; CXR from 2 days ago without evidence of PNA; u/a negative; groins look good -diabetes coordinator recs reviewed and changed to recommended recommendations;  FSBS this am in the 50's x 2-receiving amp of D50. -DVT prophylaxis:  SQ heparin -does not have appetite-encouraged her to eat if she can   Leontine Locket, PA-C [facility] [phone] 01/19/2018 8:04 AM

## 2018-01-19 NOTE — Progress Notes (Signed)
Hypoglycemic Event  CBG: 49  Treatment: D50 IV 25 mL  Symptoms: None  Follow-up CBG: Time:1233 CBG Result:202  Possible Reasons for Event: Vomiting and Inadequate meal intake  Comments/MD notified:1230 Matt PA. Will follow up with MD.    Payton Emerald

## 2018-01-19 NOTE — Progress Notes (Signed)
Spoke with Crane PA concerning elevated blood pressures, nausea/vomiting, low cbg's and frequent monitoring of pt. Hospitalist consult. Pt has no relief with current medication regime for nausea and pain. Pt currently on 5% dextrose at 30 ml/hr to help. Currently blood transfusing with daughter at bedside. Pt resting with call bell within reach.  Will continue to monitor. Payton Emerald, RN

## 2018-01-19 NOTE — Consult Note (Addendum)
Consultation Note   [name] NTI:[id number] DOB: [birth year] DOA: 01/16/2018   PCP: Bernerd Limbo, MD   Consulting physician: Evangeline Gula  Requesting physician: Trula Slade  Reason for consultation: Hypoglycemia and diabetic, recurrent nausea and vomiting postoperatively  HPI: [name] is a 67 y.o. 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  She underwent endovascular repair of abdominal aortic aneurysm on 5/14.  Postoperative course has been complicated by uncontrolled blood pressure, worsening renal function (prompting nephrology consultation), hypoglycemia, poor oral intake with recurrent nausea and vomiting, and the development of large retroperitoneal hematoma associated anemia.  Patient has had persistent hypoglycemia despite addition of dextrose infusion.  Her nausea and vomiting has not been controlled with Zofran.  CT of the abdomen and pelvis done today revealed retroperitoneal hematoma but no evidence of bowel obstruction or severe constipation.   Review of Systems:  In addition to the HPI above,  No Fever-chills, myalgias or other constitutional symptoms No Headache, changes with Vision or hearing, new weakness, tingling, numbness in any extremity, dizziness, dysarthria or word finding difficulty, gait disturbance or imbalance, tremors or seizure activity No problems swallowing food or Liquids, indigestion/reflux, choking or coughing while eating, abdominal pain with or after eating No Chest pain, Cough or Shortness of Breath, palpitations, orthopnea or DOE No melena,hematochezia, dark tarry stools, constipation No dysuria, malodorous urine, hematuria or flank pain No new skin rashes, lesions, masses or bruises, No new joint pains, aches, swelling or  redness No recent unintentional weight gain or loss No polyuria, polydypsia or polyphagia   Past Medical History:  Diagnosis Date  . AAA (abdominal aortic aneurysm) ([place]) 5/09   3.7x3.3 by u/s 2009  . Abnormal EKG    deep TW inversions chronic  . Anemia   . CAD (coronary artery disease)    a. CABG 03/2011 LIMA to LAD, SVG to OM, SVG to PDA. b. cath 06/25/2015 DES to SVG to rPDA, patent LIMA to LAD, patent SVG to OM  . carotid stenosis 5/09   S/p L CEA ;  60-79% bilat ICA by preCABG dopplers 7/12  . CHF (congestive heart failure) ([place])   . Chronic back pain    "all over" (06/24/2015)  . Chronic bronchitis ([place])    "get it pretty much q yr" (06/24/2015)  . CKD (chronic kidney disease)   . Complication of anesthesia 1966   "problem w/ether"  . COPD (chronic obstructive pulmonary disease) ([place])   . Coronary artery disease 5/09   s/p CABG 7/12   L-LAD, S-OM, S-PDA  . Depression   . GERD (gastroesophageal reflux disease)    With hiatal hernia  . GIB (gastrointestinal bleeding) 9/11   S/p EGD with cautery at [facility]  . Heart murmur   . History of blood transfusion "a few times"   "all related to anemia" (06/24/2015)  . History of hiatal hernia   . Hyperlipidemia   . Hypertension   . Mitral regurgitation    3+ MR by intraoperative TEE;  s/p MV repair with Dr. Roxy Manns 7/12  . Myocardial infarction [facility]) 2012 "several"  . On home oxygen therapy    "2 liters at night; negative for sleep apnea"  . PAD (peripheral artery disease) ([facility])    Severe; s/p bilateral renal artery stents, moderate in-stent restenosis  . Paroxysmal atrial fibrillation ([facility])  coumadin;  echo 9/07: EF 60%, mild LVH;  s/p Cox Maze 7/12 with LAA clipping  . Subclavian artery stenosis, left ([facility])    stented by Dr. Trula Slade on 10/18 to help flow of her LIMA to LAD  . Type II diabetes mellitus ([place])     Past Surgical History:  Procedure Laterality Date  . ABDOMINAL AORTIC ENDOVASCULAR STENT GRAFT N/A 01/17/2018    Procedure: ABDOMINAL AORTIC ENDOVASCULAR STENT GRAFT WITH CO2;  Surgeon: Serafina Mitchell, MD;  Location: [place];  Service: Vascular;  Laterality: N/A;  . ABDOMINAL HYSTERECTOMY  1990's  . APPENDECTOMY  Aug. 11, 2016   Ruptured  . AV FISTULA PLACEMENT Left 11/03/2017   Procedure: ARTERIOVENOUS (AV) FISTULA CREATION LEFT ARM;  Surgeon: Serafina Mitchell, MD;  Location: [facility];  Service: Vascular;  Laterality: Left;  . BOWEL RESECTION  2013  . CARDIAC CATHETERIZATION N/A 06/24/2015   Procedure: Left Heart Cath and Cors/Grafts Angiography;  Surgeon: Leonie Man, MD;  Location: [place] [facility];  Service: Cardiovascular;  Laterality: N/A;  . CARDIAC CATHETERIZATION N/A 06/25/2015   Procedure: Coronary Stent Intervention;  Surgeon: Sherren Mocha, MD;  Location: [place] [facility];  Service: Cardiovascular;  Laterality: N/A;  . CAROTID ENDARTERECTOMY Left   . CORONARY ANGIOPLASTY    . CORONARY ARTERY BYPASS GRAFT  03/05/2011   CABG X3 (LIMA to LAD, SVG to OM, SVG to PDA, EVH via left thigh  . CORONARY STENT PLACEMENT    . LACERATION REPAIR Right 1990's   WRIST  . MAZE  03/05/2011   complete biatrial lesion set with clipping of LA appendage  . MITRAL VALVE REPAIR  03/05/2011   16mm Memo 3D ring annuloplasty for ischemic MR  . PERIPHERAL VASCULAR CATHETERIZATION N/A 06/24/2015   Procedure: Aortic Arch Angiography;  Surgeon: Serafina Mitchell, MD;  Location: [facility] [place] [facility];  Service: Cardiovascular;  Laterality: N/A;  . PERIPHERAL VASCULAR CATHETERIZATION  06/24/2015   Procedure: Peripheral Vascular Intervention;  Surgeon: Serafina Mitchell, MD;  Location: [place] [facility];  Service: Cardiovascular;;  . PERIPHERAL VASCULAR CATHETERIZATION N/A 06/24/2015   Procedure: Abdominal Aortogram;  Surgeon: Serafina Mitchell, MD;  Location: [place] [facility];  Service: Cardiovascular;  Laterality: N/A;  . RENAL ANGIOGRAM Bilateral 09/11/2013   Procedure: RENAL ANGIOGRAM;  Surgeon: Serafina Mitchell, MD;   Location: [facility];  Service: Cardiovascular;  Laterality: Bilateral;  . RIGHT FEMORAL-POPLITEAL BYPASS      Social History   Socioeconomic History  . Marital status: Widowed    Spouse name: Not on file  . Number of children: Not on file  . Years of education: Not on file  . Highest education level: Not on file  Occupational History  . Occupation: Scientist, water quality in past    Employer: DISABLED  Social Needs  . Financial resource strain: Not on file  . Food insecurity:    Worry: Not on file    Inability: Not on file  . Transportation needs:    Medical: Not on file    Non-medical: Not on file  Tobacco Use  . Smoking status: Current Every Day Smoker    Packs/day: 1.00    Years: 50.00    Pack years: 50.00    Types: Cigarettes  . Smokeless tobacco: Never Used  Substance and Sexual Activity  . Alcohol use: Yes    Alcohol/week: 0.0 oz    Comment: occasionally  . Drug use: Yes    Types: Marijuana  . Sexual  activity: Not on file  Lifestyle  . Physical activity:    Days per week: Not on file    Minutes per session: Not on file  . Stress: Not on file  Relationships  . Social connections:    Talks on phone: Not on file    Gets together: Not on file    Attends religious service: Not on file    Active member of club or organization: Not on file    Attends meetings of clubs or organizations: Not on file    Relationship status: Not on file  . Intimate partner violence:    Fear of current or ex partner: Not on file    Emotionally abused: Not on file    Physically abused: Not on file    Forced sexual activity: Not on file  Other Topics Concern  . Not on file  Social History Narrative   Widowed in 2009.     Allergies  Allergen Reactions  . Isosorbide     UNSPECIFIED REACTION - okay in low doses  . Codeine Itching  . Contrast Media [Iodinated Diagnostic Agents] Itching and Other (See Comments)    Itching of feet  . Ioxaglate Itching and Other (See Comments)    Itching of  feet  . Metrizamide Itching and Other (See Comments)    Itching of feet  . Percocet [Oxycodone-Acetaminophen] Itching and Other (See Comments)    Tolerates Hydrocodone    Family History  Problem Relation Age of Onset  . Emphysema Mother   . Heart disease Mother        before age 63  . Hypertension Mother   . Hyperlipidemia Mother   . Heart attack Mother   . AAA (abdominal aortic aneurysm) Mother        rupture  . Heart attack Father 31  . Emphysema Father   . Heart disease Father        before age 40  . Hyperlipidemia Father   . Hypertension Father   . Peripheral vascular disease Father   . Diabetes Brother   . Heart disease Brother        before age 8  . Hyperlipidemia Brother   . Hypertension Brother   . Heart attack Brother        CABG  . Heart disease Other        Vascular disease in grandparents, uncles and dad     Prior to Admission medications   Medication Sig Start Date End Date Taking? Authorizing Provider  albuterol (PROVENTIL HFA;VENTOLIN HFA) 108 (90 Base) MCG/ACT inhaler Inhale 2 puffs into the lungs every 4 (four) hours as needed for wheezing or shortness of breath. ProAir 10/08/15  Yes Delfina Redwood, MD  allopurinol (ZYLOPRIM) 100 MG tablet Take 100 mg by mouth daily. 02/02/17  Yes [provider]  ALPRAZolam Duanne Moron) 0.5 MG tablet Take 0.25 mg by mouth at bedtime as needed for anxiety.  10/16/15  Yes [provider]  amLODipine (NORVASC) 10 MG tablet Take 10 mg by mouth daily.     Yes [provider]  atorvastatin (LIPITOR) 40 MG tablet Take 40 mg by mouth every evening.  12/07/17  Yes [provider]  carvedilol (COREG) 25 MG tablet TAKE 1 TABLET BY MOUTH 2 TIMES A DAY WITH A MEAL Patient taking differently: TAKE 25 MG BY MOUTH 2 TIMES A DAY WITH A MEAL 11/04/15  Yes Truitt Merle C, NP  Cholecalciferol (VITAMIN D3) 5000 units TABS Take 5,000 Units by  mouth daily.   Yes [provider]  cloNIDine (CATAPRES) 0.2  MG tablet Take 1 tablet (0.2 mg total) by mouth 3 (three) times daily. 07/10/15  Yes Richardson Dopp T, PA-C  clopidogrel (PLAVIX) 75 MG tablet Take 1 tablet (75 mg total) by mouth daily with breakfast. 06/26/15  Yes Almyra Deforest, PA  DULoxetine (CYMBALTA) 60 MG capsule Take one capsule (60 mg dose) by mouth daily. 10/26/17  Yes [provider]  famotidine (PEPCID) 20 MG tablet TAKE 1 TABLET (20 MG TOTAL) BY MOUTH DAILY BEFORE SUPPER. 01/27/16  Yes Sherren Mocha, MD  fenofibrate 160 MG tablet Take 1 tablet (160 mg total) by mouth daily. 10/08/15  Yes Delfina Redwood, MD  ferrous sulfate 325 (65 FE) MG tablet Take 325 mg by mouth 2 (two) times daily with a meal.   Yes [provider]  furosemide (LASIX) 40 MG tablet Take 40 mg by mouth daily. May take an additional tablet day as needed for weight gain  05/30/17  Yes [provider]  glipiZIDE (GLUCOTROL) 5 MG tablet Take one half tablet (2.5 mg dose) by mouth daily. 12/06/17  Yes [provider]  hydrALAZINE (APRESOLINE) 100 MG tablet Take 100 mg by mouth 3 (three) times daily.   Yes [provider]  HYDROcodone-acetaminophen (NORCO/VICODIN) 5-325 MG tablet Take 1 tablet by mouth every 8 (eight) hours as needed for moderate pain. 11/03/17  Yes Rhyne, Hulen Shouts, PA-C  isosorbide mononitrate (IMDUR) 30 MG 24 hr tablet Take 0.5 tablets (15 mg total) by mouth daily. 12/02/15  Yes Sherren Mocha, MD  magic mouthwash SOLN Take 15 mLs by mouth 4 (four) times daily as needed for mouth pain.  10/23/17  Yes [provider]  Omega-3 Fatty Acids (FISH OIL) 1000 MG CAPS Take 1,000 mg by mouth 2 (two) times daily.   Yes [provider]  pantoprazole (PROTONIX) 40 MG tablet Take 1 tablet (40 mg total) by mouth daily. 06/26/15  Yes Almyra Deforest, PA  Potassium 99 MG TABS Take 99 mg by mouth daily.    Yes [provider]  zolpidem (AMBIEN) 10 MG tablet Take 10 mg by mouth at bedtime.   Yes [provider]    Physical Exam: Vitals:   01/19/18 1100 01/19/18 1234 01/19/18 1250 01/19/18 1322  BP:   (!) 176/63 (!) 197/60  Pulse:   61   Resp: 18  16   Temp:  98.3 F (36.8 C)    TempSrc:  Oral Oral   SpO2:      Weight:      Height:          Constitutional: NAD, calm, uncomfortable 2/2 going nausea and crampy abdominal discomfort Eyes: PERRL, lids and conjunctivae normal ENMT: Mucous membranes are moist. Posterior pharynx clear of any exudate or lesions. Neck: normal, supple, no masses, no thyromegaly Respiratory: clear to auscultation bilaterally, no wheezing, no crackles. Normal respiratory effort. No accessory muscle use.  Nasal cannula oxygen Cardiovascular: Regular rate and rhythm, no murmurs / rubs / gallops. No extremity edema. 2+ radial pulses-pedal pulses dopplered. No carotid bruits.  Abdomen: Generalized abdominal tenderness primarily in the upper abdomen without guarding or rebounding, no masses palpated. No hepatosplenomegaly. Bowel sounds positive hypoactive.  Genitourinary: Foley catheter in place Musculoskeletal: no clubbing / cyanosis. No joint deformity upper and lower extremities. Good ROM, no contractures. Normal muscle tone.  Skin: no rashes, lesions, ulcers. No induration Neurologic: CN 2-12 grossly intact. Sensation intact, DTR normal. Strength 5/5  x all 4 extremities.  Psychiatric: Normal judgment and insight. Alert and oriented x 3. Normal mood.    Labs on Admission: I have personally reviewed following labs and imaging studies  CBC: Recent Labs  Lab 01/14/18 1200 01/17/18 0331 01/17/18 2200 01/18/18 0445 01/19/18 0432  WBC 9.3 7.9 21.1* 24.6* 26.9*  HGB 8.7* 8.7* 11.6* 11.3* 8.8*  HCT 27.3* 26.9* 35.1* 33.3* 26.1*  MCV 97.2 97.1 89.3 88.1 89.4  PLT 268 268 203 200 740   Basic Metabolic Panel: Recent Labs  Lab 01/14/18 1200 01/17/18 0331 01/17/18 2200 01/18/18 0445 01/19/18 0432  NA 140 146* 141 141 142  K 3.4* 3.9 5.8* 5.2* 4.4   CL 101 107 108 108 107  CO2 27 28 21* 23 23  GLUCOSE 153* 121* 244* 157* 51*  BUN 21* 20 21* 27* 40*  CREATININE 2.17* 2.29* 2.43* 2.65* 3.86*  CALCIUM 8.6* 8.6* 7.7* 7.8* 7.8*  MG  --   --  1.5*  --   --    GFR: Estimated Creatinine Clearance: 13.2 mL/min (A) (by C-G formula based on SCr of 3.86 mg/dL (H)). Liver Function Tests: Recent Labs  Lab 01/17/18 0331  AST 13*  ALT 9*  ALKPHOS 42  BILITOT 0.5  PROT 5.2*  ALBUMIN 2.6*   No results for input(s): LIPASE, AMYLASE in the last 168 hours. No results for input(s): AMMONIA in the last 168 hours. Coagulation Profile: Recent Labs  Lab 01/17/18 0331 01/17/18 2200  INR 1.05 1.22   Cardiac Enzymes: No results for input(s): CKTOTAL, CKMB, CKMBINDEX, TROPONINI in the last 168 hours. BNP (last 3 results) No results for input(s): PROBNP in the last 8760 hours. HbA1C: No results for input(s): HGBA1C in the last 72 hours. CBG: Recent Labs  Lab 01/19/18 0812 01/19/18 0831 01/19/18 1025 01/19/18 1206 01/19/18 1233  GLUCAP 52* 151* 70 49* 202*   Lipid Profile: No results for input(s): CHOL, HDL, LDLCALC, TRIG, CHOLHDL, LDLDIRECT in the last 72 hours. Thyroid Function Tests: No results for input(s): TSH, T4TOTAL, FREET4, T3FREE, THYROIDAB in the last 72 hours. Anemia Panel: No results for input(s): VITAMINB12, FOLATE, FERRITIN, TIBC, IRON, RETICCTPCT in the last 72 hours. Urine analysis:    Component Value Date/Time   COLORURINE YELLOW 01/16/2018 2355   APPEARANCEUR CLEAR 01/16/2018 2355   LABSPEC 1.009 01/16/2018 2355   PHURINE 6.0 01/16/2018 2355   GLUCOSEU NEGATIVE 01/16/2018 2355   HGBUR NEGATIVE 01/16/2018 2355   BILIRUBINUR NEGATIVE 01/16/2018 2355   KETONESUR NEGATIVE 01/16/2018 2355   PROTEINUR NEGATIVE 01/16/2018 2355   UROBILINOGEN 0.2 03/04/2011 2107   NITRITE NEGATIVE 01/16/2018 2355   LEUKOCYTESUR NEGATIVE 01/16/2018 2355   Sepsis Labs: @LABRCNTIP (procalcitonin:4,lacticidven:4) ) Recent Results  (from the past 240 hour(s))  Urine C&S     Status: None   Collection Time: 01/14/18 10:46 AM  Result Value Ref Range Status   Specimen Description   Final    URINE, RANDOM Performed at [facility], [place] [address] [place], [place] [address]    Special Requests   Final    NONE Performed at [facility], [place] [address], [place], [place] [address]    Culture   Final    NO GROWTH Performed at [facility], [place] [address], [place], [place] [address]    Report Status 01/15/2018 FINAL  Final  Surgical PCR screen     Status: None   Collection Time: 01/16/18 11:59 PM  Result Value Ref Range Status   MRSA,  PCR NEGATIVE NEGATIVE Final   Staphylococcus aureus NEGATIVE NEGATIVE Final    Comment: (NOTE) The Xpert SA Assay (FDA approved for NASAL specimens in patients 61 years of age and older), is one component of a comprehensive surveillance program. It is not intended to diagnose infection nor to guide or monitor treatment. Performed at [facility], [place] [address] [place], [place] [address]      Radiological Exams on Admission: Ct Abdomen Pelvis Wo Contrast  Result Date: 01/19/2018 CLINICAL DATA:  Prior endovascular aortic repair 2 days ago. Dropping hemoglobin. Abdominal pain. EXAM: CT ABDOMEN AND PELVIS WITHOUT CONTRAST TECHNIQUE: Multidetector CT imaging of the abdomen and pelvis was performed following the standard protocol without IV contrast. COMPARISON:  CT 01/14/2018 FINDINGS: Lower chest: Small left pleural effusion and trace right effusion, new since prior study. Mild cardiomegaly. Densely calcified coronary arteries with prior CABG changes. Dependent bibasilar atelectasis. Hepatobiliary: Layering high-density material within the gallbladder, likely a combination of small stones and vicarious excretion of contrast. No focal hepatic abnormality. Pancreas: No focal abnormality or ductal dilatation. Spleen: No focal abnormality.   Normal size. Adrenals/Urinary Tract: Renovascular calcifications in the renal hila bilaterally. Prior bilateral renal artery stenting. No hydronephrosis. No adrenal mass. Urinary bladder is unremarkable Foley catheter in place. Stomach/Bowel: Stomach, large and small bowel grossly unremarkable. Vascular/Lymphatic: Changes of prior stent graft repair of abdominal aortic aneurysm. Maximum aneurysm sac size 4.9 cm compared to 5.4 cm previously, small amount of gas noted within the aneurysm sac external to the stent graft. Stent graft extends into the common iliac arteries bilaterally. Reproductive: Prior hysterectomy.  No adnexal masses. Other: Large left retroperitoneal hematoma, measuring approximately 9.2 x 7.9 cm on image 57. This extends over approximately of 18 cm length in craniocaudal do direction from just below the left kidney extending inferiorly into the lower pelvis to the left of the urinary bladder. No free fluid or free air. Musculoskeletal: No acute bony abnormality. IMPRESSION: Interval stent graft repair of AAA. Aneurysm sac size now 4.9 cm maximally compared to 5.4 cm previously. Large left retroperitoneal hematoma measuring 18 x 9.2 x 7.9 cm. Small left pleural effusion and trace right pleural effusion. Bibasilar atelectasis. These results will be called to the ordering clinician or representative by the Radiologist Assistant, and communication documented in the PACS or zVision Dashboard. Electronically Signed   By: Rolm Baptise M.D.   On: 01/19/2018 10:04   Dg Chest Port 1 View  Result Date: 01/17/2018 CLINICAL DATA:  S/p AAA repair using bifurcation graft EXAM: PORTABLE CHEST 1 VIEW COMPARISON:  Chest x-ray dated 01/14/2018. FINDINGS: Mild cardiomegaly is stable. Overall cardiomediastinal silhouette is stable. Aortic atherosclerosis. Lungs are clear. No pleural effusion or pneumothorax seen. No acute or suspicious osseous finding. Median sternotomy wires appear intact and stable in alignment.  IMPRESSION: No active disease.  No evidence of pneumonia or pulmonary edema. Electronically Signed   By: Franki Cabot M.D.   On: 01/17/2018 21:29     Assessment/Plan Principal Problem:   AAA (abdominal aortic aneurysm)  -Status post endovascular stenting -Managed by [facility]  Active Problems:   Hypoglycemia/Type II diabetes mellitus  -Postoperative issues with recurrent nausea and vomiting and poor oral intake with resultant hypoglycemia -Discontinue Glucotrol -Allow for permissive hyperglycemia  -Follow CBGs every 4 hours -Despite D5W at 30/hr she with persistent hypoglycemia with most recent CBG down to 50 so we will change to D10 infusion at 75/hr -Last HgbA1c was 6.3 in 2017    Nausea  with vomiting -In the postoperative setting and likely multifactorial secondary to oral narcotic pain medications, side effects from anesthesia, hypoglycemia -Minimize oral medications: I have discontinued oral narcotics, oral iron, fenofibrate, statin, and allopurinol -Change Pepcid to IV every 12 hours -In context of progressive renal dysfunction we will hold PPI -Zofran IV for mild nausea, Phenergan IV for recurrent nausea vomiting -Scheduled Reglan IV every 8 hours -Clear liquids only -With underlying retroperitoneal hematoma and current abdominal cramping suspect patient may develop ileus symptoms in the next 24 hours to continue to monitor closely    Retroperitoneal hematoma/Acute blood loss anemia -Management per [facility] -SQ heparin for DVT prophylaxis has been discontinued -PRBCs ordered and infusing    Uncontrolled hypertension -Management per nephrology team -RN documents significant systolic blood pressure dropped to 60 with IV labetalol -If unable to tolerate oral antihypertensives may need to utilize continuous IV medications which may require higher level of care    COPD with hypoxia  -Stable and at baseline -Currently asymptomatic    Chronic kidney disease (CKD), stage IV (severe)   -Management per nephrology team    Hyperlipidemia -Statin and fenofibrate on hold as above    CAD (coronary artery disease)/history of CABG -Currently asymptomatic -Imdur and beta-blocker prescribed    **Additional lab, imaging and/or diagnostic evaluation at discretion of supervising physician     Samella Parr ANP-BC [facility] Pager [phone]   If 7PM-7AM, please contact night-coverage [email or web address] Password [email or web address]  01/19/2018, 1:57 PM

## 2018-01-19 NOTE — Care Management Note (Signed)
Case Management Note Marvetta Gibbons RN, BSN Unit 4E-Case Manager [phone]  Patient Details  Name: DALEEN STEINHAUS MRN: [id number] Date of Birth: 03/23/1951  Subjective/Objective:    Pt admitted s/p EVAR for AAA repair  Post op increase in creatinine- on Dopamine renal dose            Action/Plan: PTA Pt lived at home, has home 02 at baseline 2L at night, - CM to follow for transition of care needs.    Expected Discharge Date:                  Expected Discharge Plan:  Home/Self Care  In-House Referral:     Discharge planning Services  CM Consult  Post Acute Care Choice:    Choice offered to:     DME Arranged:    DME Agency:     HH Arranged:    HH Agency:     Status of Service:  In process, will continue to follow  If discussed at Long Length of Stay Meetings, dates discussed:    Discharge Disposition:   Additional Comments:  Dawayne Patricia, RN 01/19/2018, 11:42 AM

## 2018-01-20 DIAGNOSIS — I714 Abdominal aortic aneurysm, without rupture: Principal | ICD-10-CM

## 2018-01-20 DIAGNOSIS — J449 Chronic obstructive pulmonary disease, unspecified: Secondary | ICD-10-CM

## 2018-01-20 DIAGNOSIS — R0902 Hypoxemia: Secondary | ICD-10-CM

## 2018-01-20 DIAGNOSIS — I2510 Atherosclerotic heart disease of native coronary artery without angina pectoris: Secondary | ICD-10-CM

## 2018-01-20 DIAGNOSIS — D62 Acute posthemorrhagic anemia: Secondary | ICD-10-CM

## 2018-01-20 DIAGNOSIS — E7800 Pure hypercholesterolemia, unspecified: Secondary | ICD-10-CM

## 2018-01-20 LAB — GLUCOSE, CAPILLARY
GLUCOSE-CAPILLARY: 143 mg/dL — AB (ref 65–99)
GLUCOSE-CAPILLARY: 161 mg/dL — AB (ref 65–99)
GLUCOSE-CAPILLARY: 165 mg/dL — AB (ref 65–99)
GLUCOSE-CAPILLARY: 64 mg/dL — AB (ref 65–99)
GLUCOSE-CAPILLARY: 69 mg/dL (ref 65–99)
Glucose-Capillary: 99 mg/dL (ref 65–99)
Glucose-Capillary: 91 mg/dL (ref 65–99)
Glucose-Capillary: 133 mg/dL — ABNORMAL HIGH (ref 65–99)
Glucose-Capillary: 122 mg/dL — ABNORMAL HIGH (ref 65–99)

## 2018-01-20 LAB — BPAM RBC
BLOOD PRODUCT EXPIRATION DATE: 201906042359
BLOOD PRODUCT EXPIRATION DATE: 201906042359
BLOOD PRODUCT EXPIRATION DATE: 201906162359
Blood Product Expiration Date: 201906042359
Blood Product Expiration Date: 201906162359
ISSUE DATE / TIME: 201905141553
ISSUE DATE / TIME: 201905141956
ISSUE DATE / TIME: 201905141956
ISSUE DATE / TIME: 201905161239
ISSUE DATE / TIME: 201905161811
UNIT TYPE AND RH: 7300
UNIT TYPE AND RH: 7300
Unit Type and Rh: 7300
Unit Type and Rh: 7300
Unit Type and Rh: 7300

## 2018-01-20 LAB — TYPE AND SCREEN
ABO/RH(D): B POS
Antibody Screen: NEGATIVE
UNIT DIVISION: 0
UNIT DIVISION: 0
Unit division: 0
Unit division: 0
Unit division: 0

## 2018-01-20 LAB — CBC WITH DIFFERENTIAL/PLATELET
Abs Immature Granulocytes: 0.5 10*3/uL — ABNORMAL HIGH (ref 0.0–0.1)
BASOS ABS: 0 10*3/uL (ref 0.0–0.1)
Basophils Relative: 0 %
EOS PCT: 0 %
Eosinophils Absolute: 0 10*3/uL (ref 0.0–0.7)
HCT: 33.2 % — ABNORMAL LOW (ref 36.0–46.0)
HEMOGLOBIN: 11 g/dL — AB (ref 12.0–15.0)
Immature Granulocytes: 2 %
Lymphocytes Relative: 2 %
Lymphs Abs: 0.3 10*3/uL — ABNORMAL LOW (ref 0.7–4.0)
MCH: 28.9 pg (ref 26.0–34.0)
MCHC: 33.1 g/dL (ref 30.0–36.0)
MCV: 87.4 fL (ref 78.0–100.0)
MONO ABS: 0.9 10*3/uL (ref 0.1–1.0)
MONOS PCT: 4 %
Neutro Abs: 18.6 10*3/uL — ABNORMAL HIGH (ref 1.7–7.7)
Neutrophils Relative %: 92 %
Platelets: 116 10*3/uL — ABNORMAL LOW (ref 150–400)
RBC: 3.8 MIL/uL — ABNORMAL LOW (ref 3.87–5.11)
RDW: 16.2 % — ABNORMAL HIGH (ref 11.5–15.5)
WBC: 20.3 10*3/uL — ABNORMAL HIGH (ref 4.0–10.5)

## 2018-01-20 LAB — COMPREHENSIVE METABOLIC PANEL
ALK PHOS: 54 U/L (ref 38–126)
ANION GAP: 11 (ref 5–15)
AST: 37 U/L (ref 15–41)
Albumin: 2.3 g/dL — ABNORMAL LOW (ref 3.5–5.0)
BILIRUBIN TOTAL: 1.2 mg/dL (ref 0.3–1.2)
BUN: 39 mg/dL — ABNORMAL HIGH (ref 6–20)
CALCIUM: 7.8 mg/dL — AB (ref 8.9–10.3)
CO2: 23 mmol/L (ref 22–32)
CREATININE: 4 mg/dL — AB (ref 0.44–1.00)
Chloride: 103 mmol/L (ref 101–111)
GFR calc Af Amer: 13 mL/min — ABNORMAL LOW (ref >60)
GFR calc non Af Amer: 11 mL/min — ABNORMAL LOW (ref >60)
GLUCOSE: 93 mg/dL (ref 65–99)
Potassium: 4.3 mmol/L (ref 3.5–5.1)
SODIUM: 137 mmol/L (ref 135–145)
TOTAL PROTEIN: 5 g/dL — AB (ref 6.5–8.1)

## 2018-01-20 LAB — CBC
HEMATOCRIT: 35 % — AB (ref 36.0–46.0)
HEMOGLOBIN: 11.6 g/dL — AB (ref 12.0–15.0)
MCH: 28.9 pg (ref 26.0–34.0)
MCHC: 33.1 g/dL (ref 30.0–36.0)
MCV: 87.1 fL (ref 78.0–100.0)
Platelets: 131 10*3/uL — ABNORMAL LOW (ref 150–400)
RBC: 4.02 MIL/uL (ref 3.87–5.11)
RDW: 16 % — ABNORMAL HIGH (ref 11.5–15.5)
WBC: 22.2 10*3/uL — AB (ref 4.0–10.5)

## 2018-01-20 LAB — RENAL FUNCTION PANEL
ALBUMIN: 2.4 g/dL — AB (ref 3.5–5.0)
ANION GAP: 11 (ref 5–15)
BUN: 39 mg/dL — ABNORMAL HIGH (ref 6–20)
CALCIUM: 8 mg/dL — AB (ref 8.9–10.3)
CO2: 24 mmol/L (ref 22–32)
Chloride: 104 mmol/L (ref 101–111)
Creatinine, Ser: 3.99 mg/dL — ABNORMAL HIGH (ref 0.44–1.00)
GFR calc Af Amer: 13 mL/min — ABNORMAL LOW (ref >60)
GFR, EST NON AFRICAN AMERICAN: 11 mL/min — AB (ref >60)
Glucose, Bld: 75 mg/dL (ref 65–99)
PHOSPHORUS: 5 mg/dL — AB (ref 2.5–4.6)
POTASSIUM: 4.4 mmol/L (ref 3.5–5.1)
Sodium: 139 mmol/L (ref 135–145)

## 2018-01-20 LAB — LACTIC ACID, PLASMA
LACTIC ACID, VENOUS: 0.9 mmol/L (ref 0.5–1.9)
Lactic Acid, Venous: 0.9 mmol/L (ref 0.5–1.9)

## 2018-01-20 LAB — PROCALCITONIN: Procalcitonin: 1.92 ng/mL

## 2018-01-20 MED ORDER — ALBUTEROL SULFATE (2.5 MG/3ML) 0.083% IN NEBU
2.5000 mg | INHALATION_SOLUTION | Freq: Three times a day (TID) | RESPIRATORY_TRACT | Status: DC
  Administered 2018-01-20 – 2018-01-23 (×10): 2.5 mg via RESPIRATORY_TRACT
  Filled 2018-01-20 (×10): qty 3

## 2018-01-20 MED ORDER — DEXTROSE 50 % IV SOLN
INTRAVENOUS | Status: AC
  Administered 2018-01-20: 05:00:00
  Filled 2018-01-20: qty 50

## 2018-01-20 MED ORDER — DEXTROSE 50 % IV SOLN
INTRAVENOUS | Status: AC
  Administered 2018-01-20: 50 mL
  Filled 2018-01-20: qty 50

## 2018-01-20 MED ORDER — FUROSEMIDE 10 MG/ML IJ SOLN
80.0000 mg | Freq: Two times a day (BID) | INTRAMUSCULAR | Status: DC
  Administered 2018-01-20 – 2018-01-21 (×3): 80 mg via INTRAVENOUS
  Filled 2018-01-20 (×2): qty 8

## 2018-01-20 MED ORDER — FAMOTIDINE IN NACL 20-0.9 MG/50ML-% IV SOLN: 20.0000 mg | INTRAVENOUS | Status: DC

## 2018-01-20 MED ORDER — CLONIDINE HCL 0.2 MG PO TABS
0.3000 mg | ORAL_TABLET | Freq: Three times a day (TID) | ORAL | Status: AC
  Administered 2018-01-20 (×3): 0.3 mg via ORAL
  Filled 2018-01-20 (×3): qty 1

## 2018-01-20 NOTE — Progress Notes (Signed)
PROGRESS NOTE    Patient: [name]     PCP: Bernerd Limbo, MD                    DOB: 1950-10-07            DOA: 01/16/2018 TMA:[id number]             DOS: 01/20/2018, 3:25 PM   LOS: 4 days   Date of Service: The patient was seen and examined on 01/20/2018  Subjective:  Patient was seen and examined this morning, stable still on D10 normal saline, Denying for any dizziness headaches visual changes asymmetric weaknesses denies any shortness of breath or chest pain. Denies of any abdominal pain. Still on D10 normal saline infusion: Blood sugars 143, 99, 91 Poor p.o. intake, still being nausea no vomiting this morning  ----------------------------------------------------------------------------------------------------------------------  Brief Narrative:   67 y.o. 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  She underwent endovascular repair of abdominal aortic aneurysm on 01/17/18.   Postoperative course has been complicated by uncontrolled blood pressure, worsening renal function (prompting nephrology consultation), hypoglycemia, poor oral intake with recurrent nausea and vomiting, and the development of large retroperitoneal hematoma associated anemia.  Patient has had persistent hypoglycemia despite addition of dextrose infusion.   Principal Problem:   AAA (abdominal aortic aneurysm) (HCC) Active Problems:   Hypoglycemia   Type II diabetes mellitus (HCC)   Nausea with vomiting   Uncontrolled hypertension   Hyperlipidemia   CAD (coronary artery disease)   COPD with hypoxia (HCC)   Chronic kidney disease (CKD), stage IV (severe) (HCC)   Retroperitoneal hematoma   Acute blood loss anemia   Chronic diastolic congestive heart failure (HCC)   Assessment & Plan:   AAA (abdominal aortic aneurysm)  -Status post endovascular stenting -Managed by [facility]  Active Problems: Hypoglycemia/Type II diabetes mellitus  -Postop nausea vomiting, continues to be hypoglycemic -Discontinue Glucotrol -Allow for permissive hyperglycemia  -Follow CBGs every 4 hours -We will continue D10 IV fluid infusion -Last HgbA1c was 6.3 in 2017    Nausea with vomiting -In the postoperative setting and likely multifactorial secondary to oral narcotic pain medications, side effects from anesthesia, hypoglycemia -Minimize oral medications: discontinued oral narcotics, oral iron, fenofibrate, statin, and allopurinol -Change Pepcid to IV every 12 hours -In context of progressive renal dysfunction we will hold PPI -Zofran IV for mild nausea, Phenergan IV for recurrent nausea vomiting -Scheduled Reglan IV every 8 hours -Clear liquids only -With underlying retroperitoneal hematoma and current abdominal cramping suspect patient may develop ileus symptoms in the next 24 hours to continue to monitor closely  Leukocytosis -Likely reactive secondary to above, lactic acid normal at 0.9, procalcitonin 1.92 -CBC trending down -No signs of infection     Retroperitoneal hematoma/Acute blood loss anemia -Management per [facility] -SQ heparin for DVT prophylaxis has been discontinued -PRBCs ordered and infusing -H&H stable now    Uncontrolled hypertension -Management per nephrology team -RN documents significant systolic blood pressure dropped to 60 with IV labetalol -PRN hydralazine, clonidine    COPD with hypoxia  -Stable and at baseline -Currently asymptomatic    Chronic kidney disease (CKD), stage IV (severe)  -Management per nephrology team -Worsening BUN/creatinine    Hyperlipidemia -Statin and fenofibrate on hold as above -Monitoring LFTs, improving   CAD (coronary artery  disease)/history of CABG -Currently asymptomatic -Imdur and beta-blocker prescribed   DVT  prophylaxis:   SCDs/compression stockings     Code Status:         Full code  Family Communication: No family present at bedside The above findings and plan of care has been discussed with patient and in detail, she expressed understanding and agreement of above. Disposition Plan: > 3 days            Home     Procedures:    S/P AAA repair through the groin  Antimicrobials:  Anti-infectives (From admission, onward)   Start     Dose/Rate Route Frequency Ordered Stop   01/18/18 0300  ceFAZolin (ANCEF) IVPB 2g/100 mL premix     2 g 200 mL/hr over 30 Minutes Intravenous Every 8 hours 01/17/18 2134 01/18/18 1225   01/17/18 0600  ceFAZolin (ANCEF) IVPB 1 g/50 mL premix  Status:  Discontinued    Note to Pharmacy:  Send with pt to OR   1 g 100 mL/hr over 30 Minutes Intravenous On call 01/16/18 2055 01/17/18 2141      Objective: Vitals:   01/20/18 1100 01/20/18 1300 01/20/18 1432 01/20/18 1502  BP: (!) 194/70 (!) 186/70  (!) 190/77  Pulse: (!) 108 (!) 113  (!) 109  Resp: (!) 75     Temp: 98.2 F (36.8 C) 98 F (36.7 C)  99.3 F (37.4 C)  TempSrc: Oral Oral  Oral  SpO2: (!) 89% 90% 92% 92%  Weight:      Height:        Intake/Output Summary (Last 24 hours) at 01/20/2018 1525 Last data filed at 01/20/2018 1230 Gross per 24 hour  Intake 1748.75 ml  Output 1050 ml  Net 698.75 ml   Filed Weights   01/16/18 1918 01/16/18 2334 01/19/18 0555  Weight: 64 kg (141 lb) 63.5 kg (140 lb) 66.4 kg (146 lb 6.2 oz)    Examination:  General exam: Appears calm and comfortable  Psychiatry: Judgement and insight appear normal. Mood & affect appropriate. HEENT: WNLs Respiratory system: Clear to auscultation. Respiratory effort normal. Cardiovascular system: S1 & S2 heard, RRR. No JVD, murmurs, rubs, gallops or clicks. No pedal edema. Gastrointestinal system: Abd. nondistended, soft and nontender. No organomegaly or masses felt. Normal bowel sounds heard. Central nervous system: Alert and  oriented. No focal neurological deficits. Extremities: Symmetric 5 x 5 power. Skin: No rashes, lesions or ulcers Wounds:   Data Reviewed: I have personally reviewed following labs and imaging studies  CBC: Recent Labs  Lab 01/17/18 2200 01/18/18 0445 01/19/18 0432 01/20/18 0446 01/20/18 0803  WBC 21.1* 24.6* 26.9* 22.2* 20.3*  NEUTROABS  --   --   --   --  18.6*  HGB 11.6* 11.3* 8.8* 11.6* 11.0*  HCT 35.1* 33.3* 26.1* 35.0* 33.2*  MCV 89.3 88.1 89.4 87.1 87.4  PLT 203 200 177 131* 169*   Basic Metabolic Panel: Recent Labs  Lab 01/17/18 2200 01/18/18 0445 01/19/18 0432 01/20/18 0446 01/20/18 0803  NA 141 141 142 139 137  K 5.8* 5.2* 4.4 4.4 4.3  CL 108 108 107 104 103  CO2 21* 23 23 24 23   GLUCOSE 244* 157* 51* 75 93  BUN 21* 27* 40* 39* 39*  CREATININE 2.43* 2.65* 3.86* 3.99* 4.00*  CALCIUM 7.7* 7.8* 7.8* 8.0* 7.8*  MG 1.5*  --   --   --   --   PHOS  --   --   --  5.0*  --    GFR: Estimated Creatinine Clearance: 12.8 mL/min (A) (by C-G formula based on SCr of 4 mg/dL (H)). Liver Function Tests: Recent Labs  Lab 01/17/18 0331 01/20/18 0446 01/20/18 0803  AST 13*  --  37  ALT 9*  --  <5*  ALKPHOS 42  --  54  BILITOT 0.5  --  1.2  PROT 5.2*  --  5.0*  ALBUMIN 2.6* 2.4* 2.3*   No results for input(s): LIPASE, AMYLASE in the last 168 hours. No results for input(s): AMMONIA in the last 168 hours. Coagulation Profile: Recent Labs  Lab 01/17/18 0331 01/17/18 2200  INR 1.05 1.22   Cardiac Enzymes: No results for input(s): CKTOTAL, CKMB, CKMBINDEX, TROPONINI in the last 168 hours. BNP (last 3 results) No results for input(s): PROBNP in the last 8760 hours. HbA1C: No results for input(s): HGBA1C in the last 72 hours. CBG: Recent Labs  Lab 01/20/18 0046 01/20/18 0443 01/20/18 0557 01/20/18 0839 01/20/18 1110  GLUCAP 161* 69 143* 99 91   L Sepsis Labs: Recent Labs  Lab 01/20/18 0803 01/20/18 1023  PROCALCITON 1.92  --   LATICACIDVEN 0.9 0.9      Recent Results (from the past 240 hour(s))  Urine C&S     Status: None   Collection Time: 01/14/18 10:46 AM  Result Value Ref Range Status   Specimen Description   Final    URINE, RANDOM Performed at [facility], [place] [address], [place], [place] [address]    Special Requests   Final    NONE Performed at [facility], [place] [address] [place], [place] [address]    Culture   Final    NO GROWTH Performed at [facility], [place] [address], [place], [place] [address]    Report Status 01/15/2018 FINAL  Final  Surgical PCR screen     Status: None   Collection Time: 01/16/18 11:59 PM  Result Value Ref Range Status   MRSA, PCR NEGATIVE NEGATIVE Final   Staphylococcus aureus NEGATIVE NEGATIVE Final    Comment: (NOTE) The Xpert SA Assay (FDA approved for NASAL specimens in patients 52 years of age and older), is one component of a comprehensive surveillance program. It is not intended to diagnose infection nor to guide or monitor treatment. Performed at [facility], [place] [address], [place], [place] [address]       Radiology Studies: Ct Abdomen Pelvis Wo Contrast  Result Date: 01/19/2018 CLINICAL DATA:  Prior endovascular aortic repair 2 days ago. Dropping hemoglobin. Abdominal pain. EXAM: CT ABDOMEN AND PELVIS WITHOUT CONTRAST TECHNIQUE: Multidetector CT imaging of the abdomen and pelvis was performed following the standard protocol without IV contrast. COMPARISON:  CT 01/14/2018 FINDINGS: Lower chest: Small left pleural effusion and trace right effusion, new since prior study. Mild cardiomegaly. Densely calcified coronary arteries with prior CABG changes. Dependent bibasilar atelectasis. Hepatobiliary: Layering high-density material within the gallbladder, likely a combination of small stones and vicarious excretion of contrast. No focal hepatic abnormality. Pancreas: No focal abnormality or ductal dilatation. Spleen: No focal  abnormality.  Normal size. Adrenals/Urinary Tract: Renovascular calcifications in the renal hila bilaterally. Prior bilateral renal artery stenting. No hydronephrosis. No adrenal mass. Urinary bladder is unremarkable Foley catheter in place. Stomach/Bowel: Stomach, large and small bowel grossly unremarkable. Vascular/Lymphatic: Changes of prior stent graft repair of abdominal aortic aneurysm. Maximum aneurysm sac size 4.9 cm compared to 5.4 cm previously, small amount of gas noted within the  aneurysm sac external to the stent graft. Stent graft extends into the common iliac arteries bilaterally. Reproductive: Prior hysterectomy.  No adnexal masses. Other: Large left retroperitoneal hematoma, measuring approximately 9.2 x 7.9 cm on image 57. This extends over approximately of 18 cm length in craniocaudal do direction from just below the left kidney extending inferiorly into the lower pelvis to the left of the urinary bladder. No free fluid or free air. Musculoskeletal: No acute bony abnormality. IMPRESSION: Interval stent graft repair of AAA. Aneurysm sac size now 4.9 cm maximally compared to 5.4 cm previously. Large left retroperitoneal hematoma measuring 18 x 9.2 x 7.9 cm. Small left pleural effusion and trace right pleural effusion. Bibasilar atelectasis. These results will be called to the ordering clinician or representative by the Radiologist Assistant, and communication documented in the PACS or zVision Dashboard. Electronically Signed   By: Rolm Baptise M.D.   On: 01/19/2018 10:04    Scheduled Meds: . albuterol  2.5 mg Nebulization TID  . amLODipine  10 mg Oral Daily  . bisacodyl  10 mg Rectal Once  . cholecalciferol  5,000 Units Oral Daily  . cloNIDine  0.3 mg Transdermal Weekly  . cloNIDine  0.3 mg Oral TID  . clopidogrel  75 mg Oral Q breakfast  . darbepoetin (ARANESP) injection - NON-DIALYSIS  150 mcg Subcutaneous Q Thu-1800  . DULoxetine  60 mg Oral Daily  . furosemide  80 mg Intravenous BID   . hydrALAZINE  100 mg Oral TID  . isosorbide mononitrate  15 mg Oral Daily  . metoCLOPramide (REGLAN) injection  5 mg Intravenous Q8H  . zolpidem  5 mg Oral QHS   Continuous Infusions: . sodium chloride    . sodium chloride    . sodium chloride    . sodium chloride    . dextrose 75 mL/hr (01/19/18 1637)  . famotidine (PEPCID) IV 20 mg (01/20/18 1004)  . magnesium sulfate 1 - 4 g bolus IVPB      Time spent: >25 minutes  Deatra James, MD [facility],  Pager [phone]  If 7PM-7AM, please contact night-coverage [email or web address]   Password [facility]  01/20/2018, 3:25 PM

## 2018-01-20 NOTE — Progress Notes (Signed)
Subjective:  BP cont to be variable but taking on leg - on norvasc 10/hydral 100 TID and clonidine 0.2 TID, changed to a #3 patch but pills were stopped- now 1400 of UOP and creatinine stable- getting sugar d10 at 75 per hour and some SOB with low sats  Objective Vital signs in last 24 hours: Vitals:   01/20/18 0700 01/20/18 0900 01/20/18 0938 01/20/18 1100  BP: (!) 166/52 (!) 183/59  (!) 194/70  Pulse: 88 97  (!) 108  Resp: (!) 69   (!) 75  Temp: 98.2 F (36.8 C)   98.2 F (36.8 C)  TempSrc: Oral   Oral  SpO2: 95% 90% 91% (!) 89%  Weight:      Height:       Weight change:   Intake/Output Summary (Last 24 hours) at 01/20/2018 1115 Last data filed at 01/20/2018 0600 Gross per 24 hour  Intake 1368.75 ml  Output 1425 ml  Net -56.25 ml   Assessment/Plan: 67 year old WF- DM, HTN , 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  Now with oliguria in the setting of post op from stent graft repair of AAA-  No absolute indications for HD and now UOP trending up- labs stable - not sure if AVF would be usable as an aside.  Of note- pt had husband who started on dialysis and dies shortly thereafter so that is weighing heavy on her mind- still no indications today 2. Hypertension/volume  - appears to have diff to control BP at baseline given her med list- currently on norvasc 10/clonidine 0.2 TID- changed to patch/hydralazine 100 TID- at home on coreg as well.  Also on low dose dopamine possibly for renal perfusion has been stopped- now checking BP on arm so likely more accurate- when changing to clonidine patch need to continue the oral for short term- have ordered - BP cuff was upside down- on recheck 191/62.  Since making good urine and req IVF for sugar I will add on some lasix- try to lower IVF when can    3. Anemia  - s/p transfusion 5/14- hgb was up- now back  down- retroperitoneal hematoma.  Supportive care but will also add ESA  4. Hyperkalemia- improved 5. AAA- s/p stent graft repair- per [facility]- possibly an ileus or some evidence of CES- - follow clinical course       Antawn Sison A    Labs: Basic Metabolic Panel: Recent Labs  Lab 01/19/18 0432 01/20/18 0446 01/20/18 0803  NA 142 139 137  K 4.4 4.4 4.3  CL 107 104 103  CO2 23 24 23   GLUCOSE 51* 75 93  BUN 40* 39* 39*  CREATININE 3.86* 3.99* 4.00*  CALCIUM 7.8* 8.0* 7.8*  PHOS  --  5.0*  --    Liver Function Tests: Recent Labs  Lab 01/17/18 0331 01/20/18 0446 01/20/18 0803  AST 13*  --  37  ALT 9*  --  <5*  ALKPHOS 42  --  54  BILITOT 0.5  --  1.2  PROT 5.2*  --  5.0*  ALBUMIN 2.6* 2.4* 2.3*   No results for input(s): LIPASE, AMYLASE in the last 168 hours. No results for input(s): AMMONIA in the last 168 hours. CBC: Recent Labs  Lab 01/17/18 2200 01/18/18 0445 01/19/18 0432 01/20/18 0446 01/20/18 0803  WBC 21.1* 24.6* 26.9* 22.2* 20.3*  NEUTROABS  --   --   --   --  18.6*  HGB 11.6* 11.3* 8.8* 11.6* 11.0*  HCT 35.1* 33.3* 26.1* 35.0* 33.2*  MCV 89.3 88.1 89.4 87.1 87.4  PLT 203 200 177 131* PENDING   Cardiac Enzymes: No results for input(s): CKTOTAL, CKMB, CKMBINDEX, TROPONINI in the last 168 hours. CBG: Recent Labs  Lab 01/20/18 0046 01/20/18 0443 01/20/18 0557 01/20/18 0839 01/20/18 1110  GLUCAP 161* 69 143* 99 91    Iron Studies: No results for input(s): IRON, TIBC, TRANSFERRIN, FERRITIN in the last 72 hours. Studies/Results: Ct Abdomen Pelvis Wo Contrast  Result Date: 01/19/2018 CLINICAL DATA:  Prior endovascular aortic repair 2 days ago. Dropping hemoglobin. Abdominal pain. EXAM: CT ABDOMEN AND PELVIS WITHOUT CONTRAST TECHNIQUE: Multidetector CT imaging of the abdomen and pelvis was performed following the standard protocol without IV contrast. COMPARISON:  CT 01/14/2018 FINDINGS: Lower chest: Small left pleural effusion and trace right  effusion, new since prior study. Mild cardiomegaly. Densely calcified coronary arteries with prior CABG changes. Dependent bibasilar atelectasis. Hepatobiliary: Layering high-density material within the gallbladder, likely a combination of small stones and vicarious excretion of contrast. No focal hepatic abnormality. Pancreas: No focal abnormality or ductal dilatation. Spleen: No focal abnormality.  Normal size. Adrenals/Urinary Tract: Renovascular calcifications in the renal hila bilaterally. Prior bilateral renal artery stenting. No hydronephrosis. No adrenal mass. Urinary bladder is unremarkable Foley catheter in place. Stomach/Bowel: Stomach, large and small bowel grossly unremarkable. Vascular/Lymphatic: Changes of prior stent graft repair of abdominal aortic aneurysm. Maximum aneurysm sac size 4.9 cm compared to 5.4 cm previously, small amount of gas noted within the aneurysm sac external to the stent graft. Stent graft extends into the common iliac arteries bilaterally. Reproductive: Prior hysterectomy.  No adnexal masses. Other: Large left retroperitoneal hematoma, measuring approximately 9.2 x 7.9 cm on image 57. This extends over approximately of 18 cm length in craniocaudal do direction from just below the left kidney extending inferiorly into the lower pelvis to the left of the urinary bladder. No free fluid or free air. Musculoskeletal: No acute bony abnormality. IMPRESSION: Interval stent graft repair of AAA. Aneurysm sac size now 4.9 cm maximally compared to 5.4 cm previously. Large left retroperitoneal hematoma measuring 18 x 9.2 x 7.9 cm. Small left pleural effusion and trace right pleural effusion. Bibasilar atelectasis. These results will be called to the ordering clinician or representative by the Radiologist Assistant, and communication documented in the PACS or zVision Dashboard. Electronically Signed   By: Rolm Baptise M.D.   On: 01/19/2018 10:04   Medications: Infusions: . sodium  chloride    . sodium chloride    . sodium chloride    . sodium chloride    . dextrose 75 mL/hr (01/19/18 1637)  . famotidine (PEPCID) IV 20 mg (01/20/18 1004)  . magnesium sulfate 1 - 4 g bolus IVPB      Scheduled Medications: . albuterol  2.5 mg Nebulization TID  . amLODipine  10 mg Oral Daily  . bisacodyl  10 mg Rectal Once  . cholecalciferol  5,000 Units Oral Daily  . cloNIDine  0.3 mg Transdermal Weekly  . clopidogrel  75 mg Oral Q breakfast  . darbepoetin (ARANESP) injection - NON-DIALYSIS  150 mcg Subcutaneous Q Thu-1800  . DULoxetine  60 mg Oral Daily  . hydrALAZINE  100 mg Oral TID  . isosorbide mononitrate  15 mg Oral Daily  . metoCLOPramide (REGLAN) injection  5 mg Intravenous Q8H  . zolpidem  5 mg Oral QHS    have reviewed scheduled and prn medications.  Physical Exam: General:  Does not look as bright - some inc RR Heart: RRR Lungs: mostly clear Abdomen: diffusely tender Extremities: no edema- cyanosis on left more pronounced  Dialysis Access: left AVF  Placed 2/29- slightly small    01/20/2018,11:15 AM  LOS: 4 days

## 2018-01-20 NOTE — Progress Notes (Addendum)
Pt's CBG dropped to 64 around 0000. 1 amp of dextrose 50% given. CBG increased to 161 15 mins later. Dextrose 10% infusing at 23ml/hr. Pt was also given PRN hydralazine IV and morphine d/t elevated bp. Pt is resting call bell within reach. Will continue to monitor.   Fransico Michael, RN

## 2018-01-20 NOTE — Progress Notes (Signed)
Pt's cbg was 69 at 0443. Pt received another amp of D50. At 0557, Pt's cbg was 143. She is not able to tolerate any food or liquids. Pt is continuing to be monitored. Hydralazine PRN & morphine PRN given Q2h to help reduce her bp.   Fransico Michael, RN.

## 2018-01-20 NOTE — Progress Notes (Addendum)
  Progress Note    01/20/2018 7:41 AM 3 Days Post-Op  Subjective:  Still feels nauseated but rested better last night  Tm 99.5 HR 70's-100's  588'T-254'D systolic 82% [id number]  Vitals:   01/20/18 0500 01/20/18 0600  BP: (!) 151/48 (!) 177/54  Pulse: 100 98  Resp:    Temp:    SpO2: 94% 94%    Physical Exam: Cardiac:  regular Lungs:  Mildly labored with scattered wheezing throughout Incisions:  Bilateral groins look fine Extremities:  Bilateral feet are warm and toe discoloration on the left is slightly improved. Abdomen:  Soft; still tender throughout to palpation  CBC    Component Value Date/Time   WBC 22.2 (H) 01/20/2018 0446   RBC 4.02 01/20/2018 0446   HGB 11.6 (L) 01/20/2018 0446   HCT 35.0 (L) 01/20/2018 0446   PLT 131 (L) 01/20/2018 0446   MCV 87.1 01/20/2018 0446   MCH 28.9 01/20/2018 0446   MCHC 33.1 01/20/2018 0446   RDW 16.0 (H) 01/20/2018 0446   LYMPHSABS 1.1 02/26/2011 1353   MONOABS 0.8 02/26/2011 1353   EOSABS 0.5 02/26/2011 1353   BASOSABS 0.0 02/26/2011 1353    BMET    Component Value Date/Time   NA 139 01/20/2018 0446   K 4.4 01/20/2018 0446   CL 104 01/20/2018 0446   CO2 24 01/20/2018 0446   GLUCOSE 75 01/20/2018 0446   BUN 39 (H) 01/20/2018 0446   CREATININE 3.99 (H) 01/20/2018 0446   CREATININE 1.32 (H) 06/17/2015 1257   CALCIUM 8.0 (L) 01/20/2018 0446   GFRNONAA 11 (L) 01/20/2018 0446   GFRAA 13 (L) 01/20/2018 0446    INR    Component Value Date/Time   INR 1.22 01/17/2018 2200   INR 1.8 11/09/2010 1358     Intake/Output Summary (Last 24 hours) at 01/20/2018 0741 Last data filed at 01/20/2018 0600 Gross per 24 hour  Intake 1368.75 ml  Output 1425 ml  Net -56.25 ml     Assessment:  67 y.o. female is s/p:  EVAR  3 Days Post-Op  Plan: -pt's feet are warm and well perfused; the discoloration on her toes have improved -she continues to be nauseated, but did not vomit over night.  Feels new regimen is working; appreciate  IM assistance with this pt -creatinine continues to worsen-appreciate nephrology's help with this pt -leukocytosis improved slightly this am but still elevated. -scattered wheezes throughout-respiratory to evaluate  -hgb up to 11.6 after transfusion-check labs again in am -hypertension-Catapres patch ordered by renal -DVT prophylaxis:  SCD's for now given retroperitoneal hematoma   Amy Locket, PA-C [facility] [phone] 01/20/2018 7:41 AM  Suspect all issues are related to RP hematoma that is the result of angioplasty of her occluded left CIA.  She remains stable.  Hb improved after blood yesterday.  Continue to monitor CBC.  No indication for exploration.  Encouraged pt to mobilize.  Will try to increase PO intake so can switch to PO pain meds  Stage 4-5 renal insufficiency:  Renal following.  Cr increased this am.  Pt understands that she may end up on HD  Wells Bushra Denman

## 2018-01-21 LAB — COMPREHENSIVE METABOLIC PANEL
ALT: <5 U/L — ABNORMAL LOW (ref 14–54)
ANION GAP: 11 (ref 5–15)
AST: 25 U/L (ref 15–41)
Albumin: 1.9 g/dL — ABNORMAL LOW (ref 3.5–5.0)
Alkaline Phosphatase: 51 U/L (ref 38–126)
BUN: 38 mg/dL — ABNORMAL HIGH (ref 6–20)
CO2: 25 mmol/L (ref 22–32)
Calcium: 7.8 mg/dL — ABNORMAL LOW (ref 8.9–10.3)
Chloride: 96 mmol/L — ABNORMAL LOW (ref 101–111)
Creatinine, Ser: 4.31 mg/dL — ABNORMAL HIGH (ref 0.44–1.00)
GFR, EST AFRICAN AMERICAN: 12 mL/min — AB (ref >60)
GFR, EST NON AFRICAN AMERICAN: 10 mL/min — AB (ref >60)
Glucose, Bld: 152 mg/dL — ABNORMAL HIGH (ref 65–99)
Potassium: 4 mmol/L (ref 3.5–5.1)
SODIUM: 132 mmol/L — AB (ref 135–145)
TOTAL PROTEIN: 4.6 g/dL — AB (ref 6.5–8.1)
Total Bilirubin: 1.8 mg/dL — ABNORMAL HIGH (ref 0.3–1.2)

## 2018-01-21 LAB — CBC
HEMATOCRIT: 27.8 % — AB (ref 36.0–46.0)
Hemoglobin: 9.3 g/dL — ABNORMAL LOW (ref 12.0–15.0)
MCH: 29.3 pg (ref 26.0–34.0)
MCHC: 33.5 g/dL (ref 30.0–36.0)
MCV: 87.7 fL (ref 78.0–100.0)
Platelets: 101 10*3/uL — ABNORMAL LOW (ref 150–400)
RBC: 3.17 MIL/uL — ABNORMAL LOW (ref 3.87–5.11)
RDW: 15.3 % (ref 11.5–15.5)
WBC: 15.4 10*3/uL — ABNORMAL HIGH (ref 4.0–10.5)

## 2018-01-21 LAB — PROCALCITONIN: Procalcitonin: 1.91 ng/mL

## 2018-01-21 LAB — GLUCOSE, CAPILLARY
GLUCOSE-CAPILLARY: 156 mg/dL — AB (ref 65–99)
GLUCOSE-CAPILLARY: 146 mg/dL — AB (ref 65–99)
Glucose-Capillary: 134 mg/dL — ABNORMAL HIGH (ref 65–99)
Glucose-Capillary: 140 mg/dL — ABNORMAL HIGH (ref 65–99)
Glucose-Capillary: 145 mg/dL — ABNORMAL HIGH (ref 65–99)
Glucose-Capillary: 149 mg/dL — ABNORMAL HIGH (ref 65–99)

## 2018-01-21 MED ORDER — CLONIDINE HCL 0.2 MG PO TABS
0.3000 mg | ORAL_TABLET | Freq: Three times a day (TID) | ORAL | Status: DC
  Administered 2018-01-21 – 2018-01-27 (×19): 0.3 mg via ORAL
  Filled 2018-01-21 (×19): qty 1

## 2018-01-21 MED ORDER — FUROSEMIDE 10 MG/ML IJ SOLN
40.0000 mg | Freq: Two times a day (BID) | INTRAMUSCULAR | Status: DC
  Administered 2018-01-21 – 2018-01-22 (×2): 40 mg via INTRAVENOUS
  Filled 2018-01-21 (×2): qty 4

## 2018-01-21 MED ORDER — FAMOTIDINE 20 MG PO TABS
20.0000 mg | ORAL_TABLET | Freq: Every day | ORAL | Status: DC
  Administered 2018-01-21 – 2018-01-26 (×6): 20 mg via ORAL
  Filled 2018-01-21 (×6): qty 1

## 2018-01-21 NOTE — Progress Notes (Signed)
Subjective:  BP overall improved- on norvasc 10/hydral 100 TID and clonidine 0.3 TID, changed to a #3 patch ? now not on list  now 2000 of UOP with lasix but creatinine slightly  worse- getting sugar d10 at 75 per hour still but sugar is fine.  Still with abd pain- but did eat some   Objective Vital signs in last 24 hours: Vitals:   01/21/18 0440 01/21/18 0500 01/21/18 0824 01/21/18 0853  BP:    (!) 162/54  Pulse: 74   79  Resp:    16  Temp: 99.6 F (37.6 C)   99.2 F (37.3 C)  TempSrc: Axillary   Oral  SpO2: 97%  98% 96%  Weight:  66.8 kg (147 lb 4.3 oz)    Height:       Weight change:   Intake/Output Summary (Last 24 hours) at 01/21/2018 1028 Last data filed at 01/21/2018 0430 Gross per 24 hour  Intake 360 ml  Output 2050 ml  Net -1690 ml   Assessment/Plan: 67 year old WF- DM, HTN , 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  Initially oliguria in the setting of post op from stent graft repair of AAA- now UOP fairly normal- on lasix. No absolute indications for HD - labs fairly stable - not sure if AVF would be usable as an aside.  Of note- pt had husband who started on dialysis and dies shortly thereafter so that is weighing heavy on her mind- still no indications today 2. Hypertension/volume  - appears to have diff to control BP at baseline given her med list- currently on norvasc 10/clonidine 0.3 TID/hydralazine 100 TID- at home on coreg as well not giving here.   now checking BP on arm so likely more accurate- BP overall better.  Had really good UOP on lasix 80 BID- will dec to 40 BID and also dec cont IVF as sugar is improved  3. Anemia  - s/p transfusion 5/14- hgb was up- now back down- retroperitoneal hematoma.  Supportive care but have also added ESA  4. Hyperkalemia- improved 5. AAA- s/p stent graft repair- per [facility]-  possibly an ileus or some evidence of CES- - follow clinical course     Ceola Para A    Labs: Basic Metabolic Panel: Recent Labs  Lab 01/20/18 0446 01/20/18 0803 01/21/18 0406  NA 139 137 132*  K 4.4 4.3 4.0  CL 104 103 96*  CO2 24 23 25   GLUCOSE 75 93 152*  BUN 39* 39* 38*  CREATININE 3.99* 4.00* 4.31*  CALCIUM 8.0* 7.8* 7.8*  PHOS 5.0*  --   --    Liver Function Tests: Recent Labs  Lab 01/17/18 0331 01/20/18 0446 01/20/18 0803 01/21/18 0406  AST 13*  --  37 25  ALT 9*  --  <5* <5*  ALKPHOS 42  --  54 51  BILITOT 0.5  --  1.2 1.8*  PROT 5.2*  --  5.0* 4.6*  ALBUMIN 2.6* 2.4* 2.3* 1.9*   No results for input(s): LIPASE, AMYLASE in the last 168 hours. No results for input(s): AMMONIA in the last 168 hours. CBC: Recent Labs  Lab 01/18/18 0445 01/19/18 0432 01/20/18 0446 01/20/18 0803 01/21/18 0406  WBC 24.6* 26.9* 22.2* 20.3* 15.4*  NEUTROABS  --   --   --  18.6*  --  HGB 11.3* 8.8* 11.6* 11.0* 9.3*  HCT 33.3* 26.1* 35.0* 33.2* 27.8*  MCV 88.1 89.4 87.1 87.4 87.7  PLT 200 177 131* 116* 101*   Cardiac Enzymes: No results for input(s): CKTOTAL, CKMB, CKMBINDEX, TROPONINI in the last 168 hours. CBG: Recent Labs  Lab 01/20/18 1614 01/20/18 2008 01/20/18 2354 01/21/18 0409 01/21/18 0805  GLUCAP 133* 122* 165* 156* 146*    Iron Studies: No results for input(s): IRON, TIBC, TRANSFERRIN, FERRITIN in the last 72 hours. Studies/Results: No results found. Medications: Infusions: . dextrose 75 mL/hr at 01/20/18 1734  . famotidine (PEPCID) IV    . magnesium sulfate 1 - 4 g bolus IVPB      Scheduled Medications: . albuterol  2.5 mg Nebulization TID  . amLODipine  10 mg Oral Daily  . bisacodyl  10 mg Rectal Once  . cholecalciferol  5,000 Units Oral Daily  . cloNIDine  0.3 mg Transdermal Weekly  . clopidogrel  75 mg Oral Q breakfast  . darbepoetin (ARANESP) injection - NON-DIALYSIS  150 mcg Subcutaneous Q Thu-1800  . DULoxetine  60 mg Oral  Daily  . furosemide  80 mg Intravenous BID  . hydrALAZINE  100 mg Oral TID  . isosorbide mononitrate  15 mg Oral Daily  . metoCLOPramide (REGLAN) injection  5 mg Intravenous Q8H  . zolpidem  5 mg Oral QHS    have reviewed scheduled and prn medications.  Physical Exam: General:  Looks better Heart: RRR Lungs: mostly clear Abdomen: diffusely tender Extremities: min edema- cyanosis on left more pronounced  Dialysis Access: left AVF  Placed 2/29- slightly small    01/21/2018,10:28 AM  LOS: 5 days

## 2018-01-21 NOTE — Plan of Care (Signed)
  Problem: Education: Goal: Knowledge of General Education information will improve Outcome: Progressing   

## 2018-01-21 NOTE — Progress Notes (Signed)
PROGRESS NOTE        PATIENT DETAILS Name: [name] Age: 67 y.o. Sex: female Date of Birth: 1951-02-08 Admit Date: 01/16/2018 Admitting Physician Serafina Mitchell, MD LDJ:[id number], Shanon Brow, MD  Brief Narrative: Patient is a 67 y.o. female with prior history of diabetes, hypertension, chronic kidney disease stage IV, CAD status post CABG, carotid artery stenosis status post carotid endarterectomy, COPD on nocturnal home O2 admitted by vascular surgery for endovascular repair of AAA-which was done on 5/14, postoperative course complicated by development of retroperitoneal hematoma, worsening renal function and hypoglycemia.  Hospitalist service assisting in management of multiple issues.  See below  Subjective: Pain comfortably in bed-no chest pain or shortness of breath.  Assessment/Plan: Hypoglycemia: Resolved.  Likely secondary to Glucotrol (stopped 5/16) in the setting of worsening renal function.  Stop D10 infusion.  CBG stable in the 100s range for now-once it stays persistently above 200, probably could start sliding scale insulin.  Nausea with vomiting: Seems to have resolved-continue with supportive care.  Hypertension: Was difficult to control the past few days-but seems to be periodically well controlled at this time.  Nephrology assisting.  Continue amlodipine, clonidine, Imdur and Lasix  DM-2: Maintained on Glucotrol in the outpatient setting-due to hypoglycemia this has been discontinued.  See above regarding plans to discontinue D10 infusion check A1c.  Acute kidney injury on chronic kidney disease stage IV: Per nephrology.  COPD with chronic hypoxic respiratory failure: Lungs are clear-apparently on mostly nocturnal O2 at home per patient.  Continue with as needed bronchodilators.  History of CAD status post CABG: Stable-no anginal symptoms-continue Plavix, metoprolol  Retroperitoneal hematoma and associated acute blood loss anemia: Hemoglobin  currently stable-received 2 units of PRBC so far-Per primary team.  AAA status post endovascular repair: Per primary service  Deconditioning: PT evaluation  DVT Prophylaxis: SCD's  Code Status: Full code  Family Communication: None at bedside  Disposition Plan: Remain inpatient-per primary service-suspect will require SNF on d/c  Antimicrobial agents: Anti-infectives (From admission, onward)   Start     Dose/Rate Route Frequency Ordered Stop   01/18/18 0300  ceFAZolin (ANCEF) IVPB 2g/100 mL premix     2 g 200 mL/hr over 30 Minutes Intravenous Every 8 hours 01/17/18 2134 01/18/18 1225   01/17/18 0600  ceFAZolin (ANCEF) IVPB 1 g/50 mL premix  Status:  Discontinued    Note to Pharmacy:  Send with pt to OR   1 g 100 mL/hr over 30 Minutes Intravenous On call 01/16/18 2055 01/17/18 2141      Time spent: 25-minutes-Greater than 50% of this time was spent in counseling, explanation of diagnosis, planning of further management, and coordination of care.  MEDICATIONS: Scheduled Meds: . albuterol  2.5 mg Nebulization TID  . amLODipine  10 mg Oral Daily  . bisacodyl  10 mg Rectal Once  . cholecalciferol  5,000 Units Oral Daily  . cloNIDine  0.3 mg Transdermal Weekly  . cloNIDine  0.3 mg Oral TID  . clopidogrel  75 mg Oral Q breakfast  . darbepoetin (ARANESP) injection - NON-DIALYSIS  150 mcg Subcutaneous Q Thu-1800  . DULoxetine  60 mg Oral Daily  . furosemide  40 mg Intravenous BID  . hydrALAZINE  100 mg Oral TID  . isosorbide mononitrate  15 mg Oral Daily  . metoCLOPramide (REGLAN) injection  5 mg Intravenous Q8H  .  zolpidem  5 mg Oral QHS   Continuous Infusions: . dextrose 75 mL/hr at 01/20/18 1734  . famotidine (PEPCID) IV    . magnesium sulfate 1 - 4 g bolus IVPB     PRN Meds:.acetaminophen **OR** acetaminophen, albuterol, ALPRAZolam, guaiFENesin-dextromethorphan, hydrALAZINE, magic mouthwash, magnesium sulfate 1 - 4 g bolus IVPB, metoprolol tartrate, morphine  injection, ondansetron (ZOFRAN) IV **OR** promethazine, phenol, polyethylene glycol, potassium chloride   PHYSICAL EXAM: Vital signs: Vitals:   01/21/18 0440 01/21/18 0500 01/21/18 0824 01/21/18 0853  BP:    (!) 162/54  Pulse: 74   79  Resp:    16  Temp: 99.6 F (37.6 C)   99.2 F (37.3 C)  TempSrc: Axillary   Oral  SpO2: 97%  98% 96%  Weight:  66.8 kg (147 lb 4.3 oz)    Height:       Filed Weights   01/16/18 2334 01/19/18 0555 01/21/18 0500  Weight: 63.5 kg (140 lb) 66.4 kg (146 lb 6.2 oz) 66.8 kg (147 lb 4.3 oz)   Body mass index is 24.51 kg/m.   General appearance :Awake, alert, not in any distress.  Chronically sick appearing Eyes:, pupils equally reactive to light and accomodation,no scleral icterus.Pink conjunctiva HEENT: Atraumatic and Normocephalic Neck: supple, no JVD. No cervical lymphadenopathy. No thyromegaly Resp:Good air entry bilaterally-no rales or rhonchi heard anteriorly CVS: S1 S2 regular, no murmurs.  GI: Bowel sounds present, Non tender and not distended with no gaurding, rigidity or rebound.No organomegaly Extremities: B/L Lower Ext shows no edema, both legs are warm to touch Neurology: Nonfocal-but generalized weakness present Psychiatric: Normal judgment and insight. Alert and oriented x 3. Normal mood. Musculoskeletal:No digital cyanosis Skin:No Rash, warm and dry Wounds:N/A  I have personally reviewed following labs and imaging studies  LABORATORY DATA: CBC: Recent Labs  Lab 01/18/18 0445 01/19/18 0432 01/20/18 0446 01/20/18 0803 01/21/18 0406  WBC 24.6* 26.9* 22.2* 20.3* 15.4*  NEUTROABS  --   --   --  18.6*  --   HGB 11.3* 8.8* 11.6* 11.0* 9.3*  HCT 33.3* 26.1* 35.0* 33.2* 27.8*  MCV 88.1 89.4 87.1 87.4 87.7  PLT 200 177 131* 116* 101*    Basic Metabolic Panel: Recent Labs  Lab 01/17/18 2200 01/18/18 0445 01/19/18 0432 01/20/18 0446 01/20/18 0803 01/21/18 0406  NA 141 141 142 139 137 132*  K 5.8* 5.2* 4.4 4.4 4.3 4.0  CL  108 108 107 104 103 96*  CO2 21* 23 23 24 23 25   GLUCOSE 244* 157* 51* 75 93 152*  BUN 21* 27* 40* 39* 39* 38*  CREATININE 2.43* 2.65* 3.86* 3.99* 4.00* 4.31*  CALCIUM 7.7* 7.8* 7.8* 8.0* 7.8* 7.8*  MG 1.5*  --   --   --   --   --   PHOS  --   --   --  5.0*  --   --     GFR: Estimated Creatinine Clearance: 11.9 mL/min (A) (by C-G formula based on SCr of 4.31 mg/dL (H)).  Liver Function Tests: Recent Labs  Lab 01/17/18 0331 01/20/18 0446 01/20/18 0803 01/21/18 0406  AST 13*  --  37 25  ALT 9*  --  <5* <5*  ALKPHOS 42  --  54 51  BILITOT 0.5  --  1.2 1.8*  PROT 5.2*  --  5.0* 4.6*  ALBUMIN 2.6* 2.4* 2.3* 1.9*   No results for input(s): LIPASE, AMYLASE in the last 168 hours. No results for input(s): AMMONIA in the last 168  hours.  Coagulation Profile: Recent Labs  Lab 01/17/18 0331 01/17/18 2200  INR 1.05 1.22    Cardiac Enzymes: No results for input(s): CKTOTAL, CKMB, CKMBINDEX, TROPONINI in the last 168 hours.  BNP (last 3 results) No results for input(s): PROBNP in the last 8760 hours.  HbA1C: No results for input(s): HGBA1C in the last 72 hours.  CBG: Recent Labs  Lab 01/20/18 1614 01/20/18 2008 01/20/18 2354 01/21/18 0409 01/21/18 0805  GLUCAP 133* 122* 165* 156* 146*    Lipid Profile: No results for input(s): CHOL, HDL, LDLCALC, TRIG, CHOLHDL, LDLDIRECT in the last 72 hours.  Thyroid Function Tests: No results for input(s): TSH, T4TOTAL, FREET4, T3FREE, THYROIDAB in the last 72 hours.  Anemia Panel: No results for input(s): VITAMINB12, FOLATE, FERRITIN, TIBC, IRON, RETICCTPCT in the last 72 hours.  Urine analysis:    Component Value Date/Time   COLORURINE YELLOW 01/16/2018 2355   APPEARANCEUR CLEAR 01/16/2018 2355   LABSPEC 1.009 01/16/2018 2355   PHURINE 6.0 01/16/2018 2355   GLUCOSEU NEGATIVE 01/16/2018 2355   HGBUR NEGATIVE 01/16/2018 2355   BILIRUBINUR NEGATIVE 01/16/2018 2355   KETONESUR NEGATIVE 01/16/2018 2355   PROTEINUR  NEGATIVE 01/16/2018 2355   UROBILINOGEN 0.2 03/04/2011 2107   NITRITE NEGATIVE 01/16/2018 2355   LEUKOCYTESUR NEGATIVE 01/16/2018 2355    Sepsis Labs: Lactic Acid, Venous    Component Value Date/Time   LATICACIDVEN 0.9 01/20/2018 1023    MICROBIOLOGY: Recent Results (from the past 240 hour(s))  Urine C&S     Status: None   Collection Time: 01/14/18 10:46 AM  Result Value Ref Range Status   Specimen Description   Final    URINE, RANDOM Performed at [facility], [place] [address], [place], [place] [address]    Special Requests   Final    NONE Performed at [facility], [place] [address], [place], [place] [address]    Culture   Final    NO GROWTH Performed at [facility], [place] [address] [place], [place] [address]    Report Status 01/15/2018 FINAL  Final  Surgical PCR screen     Status: None   Collection Time: 01/16/18 11:59 PM  Result Value Ref Range Status   MRSA, PCR NEGATIVE NEGATIVE Final   Staphylococcus aureus NEGATIVE NEGATIVE Final    Comment: (NOTE) The Xpert SA Assay (FDA approved for NASAL specimens in patients 36 years of age and older), is one component of a comprehensive surveillance program. It is not intended to diagnose infection nor to guide or monitor treatment. Performed at [facility], [place] [address], [place],  [address]     RADIOLOGY STUDIES/RESULTS: Ct Abdomen Pelvis Wo Contrast  Result Date: 01/19/2018 CLINICAL DATA:  Prior endovascular aortic repair 2 days ago. Dropping hemoglobin. Abdominal pain. EXAM: CT ABDOMEN AND PELVIS WITHOUT CONTRAST TECHNIQUE: Multidetector CT imaging of the abdomen and pelvis was performed following the standard protocol without IV contrast. COMPARISON:  CT 01/14/2018 FINDINGS: Lower chest: Small left pleural effusion and trace right effusion, new since prior study. Mild cardiomegaly. Densely calcified coronary arteries with prior CABG changes. Dependent bibasilar  atelectasis. Hepatobiliary: Layering high-density material within the gallbladder, likely a combination of small stones and vicarious excretion of contrast. No focal hepatic abnormality. Pancreas: No focal abnormality or ductal dilatation. Spleen: No focal abnormality.  Normal size. Adrenals/Urinary Tract: Renovascular calcifications in the renal hila bilaterally. Prior bilateral renal artery stenting. No hydronephrosis. No adrenal mass. Urinary bladder is unremarkable Foley catheter  in place. Stomach/Bowel: Stomach, large and small bowel grossly unremarkable. Vascular/Lymphatic: Changes of prior stent graft repair of abdominal aortic aneurysm. Maximum aneurysm sac size 4.9 cm compared to 5.4 cm previously, small amount of gas noted within the aneurysm sac external to the stent graft. Stent graft extends into the common iliac arteries bilaterally. Reproductive: Prior hysterectomy.  No adnexal masses. Other: Large left retroperitoneal hematoma, measuring approximately 9.2 x 7.9 cm on image 57. This extends over approximately of 18 cm length in craniocaudal do direction from just below the left kidney extending inferiorly into the lower pelvis to the left of the urinary bladder. No free fluid or free air. Musculoskeletal: No acute bony abnormality. IMPRESSION: Interval stent graft repair of AAA. Aneurysm sac size now 4.9 cm maximally compared to 5.4 cm previously. Large left retroperitoneal hematoma measuring 18 x 9.2 x 7.9 cm. Small left pleural effusion and trace right pleural effusion. Bibasilar atelectasis. These results will be called to the ordering clinician or representative by the Radiologist Assistant, and communication documented in the PACS or zVision Dashboard. Electronically Signed   By: Rolm Baptise M.D.   On: 01/19/2018 10:04   Ct Abdomen Pelvis Wo Contrast  Addendum Date: 01/18/2018   ADDENDUM REPORT: 01/18/2018 15:46 IMPRESSION: 1. Increased size of abdominal aortic aneurysm, now 5.4 centimeters.  Recommend followup by abdomen and pelvis CTA in 3-6 months, and vascular surgery referral/consultation if not already obtained. This recommendation follows ACR consensus guidelines: White Paper of the ACR Incidental Findings Committee II on Vascular Findings. J Am Coll Radiol 2013; 10:789-794. Electronically Signed   By: Nolon Nations M.D.   On: 01/18/2018 15:46   Result Date: 01/18/2018 CLINICAL DATA:  Patient presents with left-sided abdominal pain. She states that she been sick over the last month. Hx AAA EXAM: CT ABDOMEN AND PELVIS WITHOUT CONTRAST TECHNIQUE: Multidetector CT imaging of the abdomen and pelvis was performed following the standard protocol without IV contrast. COMPARISON:  01/19/2012 FINDINGS: Lower chest: There are nonspecific subpleural reticular changes at the lung bases. No focal consolidations or pleural effusions. There is dense coronary artery calcification. Minimal pericardial calcification. No pericardial effusions. Hepatobiliary: There is layering hyperdense material within the gallbladder, raising the question of calculi or sludge. No CT evidence for acute cholecystitis. The liver is homogeneous without focal mass. Pancreas: Unremarkable. No pancreatic ductal dilatation or surrounding inflammatory changes. Spleen: Normal in size without focal abnormality. Adrenals/Urinary Tract: Adrenal glands are normal in appearance. There are numerous intrarenal calculi in bilaterally. No hydronephrosis or ureteral stones. Urinary bladder is unremarkable. Stomach/Bowel: The stomach and small bowel loops are normal in appearance. Appendectomy. Sutures are identified in the UPPER rectum consistent with remote anastomosis. No colonic mass or inflammation. There are rare colonic diverticula. Vascular/Lymphatic: There is dense atherosclerotic calcification of the abdominal aorta. Aorta is aneurysmal, measuring 5.4 x 4.7 centimeters. No retroperitoneal or mesenteric adenopathy. LEFT iliac artery  aneurysm is 2.1 centimeters. RIGHT femoral artery aneurysm is 1.6 centimeters. Reproductive: Hysterectomy.  No adnexal mass. Other: No free pelvic fluid. Anterior abdominal wall is unremarkable. Musculoskeletal: No acute or significant osseous findings. IMPRESSION: 1. Increased size of abdominal aortic aneurysm, now measuring 5.4 centimeters. Recommend followup by ultrasound in 1 year. This recommendation follows ACR consensus guidelines: White Paper of the ACR Incidental Findings Committee II on Vascular Findings. J Am Coll Radiol 2013; 10:789-794. 2. 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 3. Coronary artery disease. 4. Gallbladder stones or hyperdense sludge. 5. Nephrolithiasis.  No  urinary tract obstruction. 6. Previous hysterectomy. Electronically Signed: By: Nolon Nations M.D. On: 01/14/2018 16:10   Dg Chest 2 View  Result Date: 01/14/2018 CLINICAL DATA:  Shortness of breath, cough and congestion. History of bronchitis, hypertension, CAD/post CABG, CHF, diabetes and smoking EXAM: CHEST - 2 VIEW COMPARISON:  10/06/2015; 07/04/2015; 03/21/2013 FINDINGS: Grossly unchanged cardiac silhouette and mediastinal contours post median sternotomy and CABG. Atherosclerotic plaque within the thoracic aorta. Lungs remain hyperexpanded with flattening of the diaphragms mild diffuse slightly nodular thickening of the pulmonary interstitium. There is minimal pleuroparenchymal thickening about the bilateral major fissures. No evidence of edema. No pleural effusion or pneumothorax. No acute osseus abnormalities. IMPRESSION: Similar findings of lung hyperexpansion and chronic bronchitic change without superimposed acute cardiopulmonary disease. Electronically Signed   By: Sandi Mariscal M.D.   On: 01/14/2018 14:28   Dg Chest Port 1 View  Result Date: 01/17/2018 CLINICAL DATA:  S/p AAA repair using bifurcation graft EXAM: PORTABLE CHEST 1 VIEW COMPARISON:  Chest x-ray dated 01/14/2018. FINDINGS:  Mild cardiomegaly is stable. Overall cardiomediastinal silhouette is stable. Aortic atherosclerosis. Lungs are clear. No pleural effusion or pneumothorax seen. No acute or suspicious osseous finding. Median sternotomy wires appear intact and stable in alignment. IMPRESSION: No active disease.  No evidence of pneumonia or pulmonary edema. Electronically Signed   By: Franki Cabot M.D.   On: 01/17/2018 21:29     LOS: 5 days   Oren Binet, MD  [facility]  If 7PM-7AM, please contact night-coverage  Please page via [email or web address]-Password [email or web address]-click on MD name and type text message  01/21/2018, 11:07 AM

## 2018-01-21 NOTE — Progress Notes (Addendum)
  Progress Note    01/21/2018 10:56 AM 4 Days Post-Op  Subjective:  Pt feeling better this morning.  Ate a few bites of breakfast.  Nausea better. Still with abdominal discomfort but she feels it is a little better this morning.  Daughter is worried about her breathing but says her oxygen sat is at 96% and she hasn't seen it that high in the past day.   Tm 100.6 now 99.2 HR 70's-100's NSR 948'A-165'V systolic 37% [id number]  Vitals:   01/21/18 0824 01/21/18 0853  BP:  (!) 162/54  Pulse:  79  Resp:  16  Temp:  99.2 F (37.3 C)  SpO2: 98% 96%    Physical Exam: Cardiac:  Regular Lungs:  Non labored at this time Incisions:  Bilateral groins look good Extremities:  + palpable DP pulses bilaterally   CBC    Component Value Date/Time   WBC 15.4 (H) 01/21/2018 0406   RBC 3.17 (L) 01/21/2018 0406   HGB 9.3 (L) 01/21/2018 0406   HCT 27.8 (L) 01/21/2018 0406   PLT 101 (L) 01/21/2018 0406   MCV 87.7 01/21/2018 0406   MCH 29.3 01/21/2018 0406   MCHC 33.5 01/21/2018 0406   RDW 15.3 01/21/2018 0406   LYMPHSABS 0.3 (L) 01/20/2018 0803   MONOABS 0.9 01/20/2018 0803   EOSABS 0.0 01/20/2018 0803   BASOSABS 0.0 01/20/2018 0803    BMET    Component Value Date/Time   NA 132 (L) 01/21/2018 0406   K 4.0 01/21/2018 0406   CL 96 (L) 01/21/2018 0406   CO2 25 01/21/2018 0406   GLUCOSE 152 (H) 01/21/2018 0406   BUN 38 (H) 01/21/2018 0406   CREATININE 4.31 (H) 01/21/2018 0406   CREATININE 1.32 (H) 06/17/2015 1257   CALCIUM 7.8 (L) 01/21/2018 0406   GFRNONAA 10 (L) 01/21/2018 0406   GFRAA 12 (L) 01/21/2018 0406    INR    Component Value Date/Time   INR 1.22 01/17/2018 2200   INR 1.8 11/09/2010 1358     Intake/Output Summary (Last 24 hours) at 01/21/2018 1056 Last data filed at 01/21/2018 0430 Gross per 24 hour  Intake 360 ml  Output 2050 ml  Net -1690 ml     Assessment:  67 y.o. female is s/p:  EVAR  4 Days Post-Op  Plan: -pt's feet are warm and well  perfused -requiring [id number]-encouraged pt to use IS every hour.  She needs to get out of bed and mobilize -nausea is improved and has eaten a few bites of breakfast this am -creatinine is up but UOP has improved.  Renal following -leukocytosis continues to improve -hgb down at 9.3 from 11 but also receiving IVF.  Will check labs again in the am.  Pt is stable -DVT prophylaxis:  SCD's for now given retroperitoneal hematoma   Leontine Locket, PA-C [facility] [phone] 01/21/2018 10:56 AM  I have examined the patient, reviewed and agree with above.  H&H down slightly.  Will continue to follow.  Gust with the patient and her daughter concern regarding renal function.  Will continue to mobilize  Curt Jews, MD 01/21/2018 11:54 AM

## 2018-01-22 ENCOUNTER — Inpatient Hospital Stay (HOSPITAL_COMMUNITY): Payer: Medicare Other

## 2018-01-22 DIAGNOSIS — R112 Nausea with vomiting, unspecified: Secondary | ICD-10-CM

## 2018-01-22 LAB — CBC
HCT: 28.9 % — ABNORMAL LOW (ref 36.0–46.0)
Hemoglobin: 9.7 g/dL — ABNORMAL LOW (ref 12.0–15.0)
MCH: 29.6 pg (ref 26.0–34.0)
MCHC: 33.6 g/dL (ref 30.0–36.0)
MCV: 88.1 fL (ref 78.0–100.0)
PLATELETS: 136 10*3/uL — AB (ref 150–400)
RBC: 3.28 MIL/uL — AB (ref 3.87–5.11)
RDW: 14.8 % (ref 11.5–15.5)
WBC: 14.2 10*3/uL — ABNORMAL HIGH (ref 4.0–10.5)

## 2018-01-22 LAB — HEMOGLOBIN A1C
Hgb A1c MFr Bld: 5.2 % (ref 4.8–5.6)
Mean Plasma Glucose: 102.54 mg/dL

## 2018-01-22 LAB — COMPREHENSIVE METABOLIC PANEL
ALT: <5 U/L — ABNORMAL LOW (ref 14–54)
AST: 22 U/L (ref 15–41)
Albumin: 1.9 g/dL — ABNORMAL LOW (ref 3.5–5.0)
Alkaline Phosphatase: 62 U/L (ref 38–126)
Anion gap: 13 (ref 5–15)
BILIRUBIN TOTAL: 2.1 mg/dL — AB (ref 0.3–1.2)
BUN: 42 mg/dL — ABNORMAL HIGH (ref 6–20)
CHLORIDE: 94 mmol/L — AB (ref 101–111)
CO2: 27 mmol/L (ref 22–32)
Calcium: 8.4 mg/dL — ABNORMAL LOW (ref 8.9–10.3)
Creatinine, Ser: 4.3 mg/dL — ABNORMAL HIGH (ref 0.44–1.00)
GFR, EST AFRICAN AMERICAN: 12 mL/min — AB (ref >60)
GFR, EST NON AFRICAN AMERICAN: 10 mL/min — AB (ref >60)
Glucose, Bld: 110 mg/dL — ABNORMAL HIGH (ref 65–99)
POTASSIUM: 4 mmol/L (ref 3.5–5.1)
Sodium: 134 mmol/L — ABNORMAL LOW (ref 135–145)
TOTAL PROTEIN: 5 g/dL — AB (ref 6.5–8.1)

## 2018-01-22 LAB — GLUCOSE, CAPILLARY
GLUCOSE-CAPILLARY: 116 mg/dL — AB (ref 65–99)
GLUCOSE-CAPILLARY: 111 mg/dL — AB (ref 65–99)
GLUCOSE-CAPILLARY: 124 mg/dL — AB (ref 65–99)
Glucose-Capillary: 151 mg/dL — ABNORMAL HIGH (ref 65–99)
Glucose-Capillary: 100 mg/dL — ABNORMAL HIGH (ref 65–99)

## 2018-01-22 LAB — PROCALCITONIN: PROCALCITONIN: 1.59 ng/mL

## 2018-01-22 MED ORDER — TRAMADOL HCL 50 MG PO TABS
50.0000 mg | ORAL_TABLET | Freq: Four times a day (QID) | ORAL | Status: DC | PRN
  Administered 2018-01-22 – 2018-01-24 (×4): 100 mg via ORAL
  Administered 2018-01-25: 50 mg via ORAL
  Administered 2018-01-26: 100 mg via ORAL
  Filled 2018-01-22 (×2): qty 2
  Filled 2018-01-22: qty 1
  Filled 2018-01-22 (×3): qty 2

## 2018-01-22 MED ORDER — FUROSEMIDE 10 MG/ML IJ SOLN
INTRAMUSCULAR | Status: AC
  Filled 2018-01-22: qty 4

## 2018-01-22 NOTE — Progress Notes (Signed)
PROGRESS NOTE        PATIENT DETAILS Name: [name] Age: 67 y.o. Sex: female Date of Birth: [birth year] Admit Date: 01/16/2018 Admitting Physician Serafina Mitchell, MD [id number], Shanon Brow, MD  Brief Narrative: Patient is a 67 y.o. female with prior history of diabetes, hypertension, chronic kidney disease stage IV, CAD status post CABG, carotid artery stenosis status post carotid endarterectomy, COPD on nocturnal home O2 admitted by vascular surgery for endovascular repair of AAA-which was done on 5/14, postoperative course complicated by development of retroperitoneal hematoma, worsening renal function and hypoglycemia.  Hospitalist service assisting in management of multiple issues.  See below  Subjective: Pain comfortably in bed-no chest pain or shortness of breath.  Assessment/Plan: Hypoglycemia: Resolved.  Likely secondary to Glucotrol (stopped 5/16) in the setting of worsening renal function.  Longer requiring D10 infusion-CBG stable.  Her A1c is 5.2- suspect we can monitor of any therapy and repeat A1c in the next few months as an outpatient.    Nausea with vomiting: Resolved with just supportive care.    Hypertension: Was difficult to control initially-but current BP readings are reasonable for someone with significant chronic kidney disease.  Nephrology following-remains on amlodipine, clonidine and Lasix.   DM-2: Maintained on Glucotrol in the outpatient setting-due to hypoglycemia this has been discontinued.  Her A1c is 5.2-she has been very well controlled (maybe too well controlled)-given issues with chronic kidney disease-she remains at risk for hypoglycemia-would allow some mild permissive hyperglycemia-suspect we can monitor off all anti-glycemic agents for now and repeat A1c in the next few months before deciding to resume oral agents.    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    History of CAD status post CABG: Stable-no anginal symptoms-continue Plavix, metoprolol  Retroperitoneal hematoma and associated acute blood loss anemia: Hemoglobin currently stable-received 2 units of PRBC so far-Per primary team.  AAA status post endovascular repair: Per primary service  Deconditioning: PT evaluation-may need SNF  Patient's chronic medical issues that the hospitalist team managing are very stable-nephrology is following and managing AKI and hypertension.  Hospitalist team will sign off-please reconsult if needed.  Please see above recommendations regarding diabetes.  Spoke with [facility] PA-C earlier this morning  DVT Prophylaxis: SCD's  Code Status: Full code  Family Communication: None at bedside  Disposition Plan: Remain inpatient-per primary service-suspect will require SNF on d/c  Antimicrobial agents: Anti-infectives (From admission, onward)   Start     Dose/Rate Route Frequency Ordered Stop   01/18/18 0300  ceFAZolin (ANCEF) IVPB 2g/100 mL premix     2 g 200 mL/hr over 30 Minutes Intravenous Every 8 hours 01/17/18 2134 01/18/18 1225   01/17/18 0600  ceFAZolin (ANCEF) IVPB 1 g/50 mL premix  Status:  Discontinued    Note to Pharmacy:  Send with pt to OR   1 g 100 mL/hr over 30 Minutes Intravenous On call 01/16/18 2055 01/17/18 2141      Time spent: 25-minutes-Greater than 50% of this time was spent in counseling, explanation of diagnosis, planning of further management, and coordination of care.  MEDICATIONS: Scheduled Meds: . albuterol  2.5 mg Nebulization TID  . amLODipine  10 mg Oral Daily  . bisacodyl  10 mg Rectal  Once  . cholecalciferol  5,000 Units Oral Daily  . cloNIDine  0.3 mg Transdermal Weekly  . cloNIDine  0.3 mg Oral TID  . clopidogrel  75 mg Oral Q breakfast  . darbepoetin (ARANESP) injection - NON-DIALYSIS  150 mcg Subcutaneous Q Thu-1800    . DULoxetine  60 mg Oral Daily  . famotidine  20 mg Oral QHS  . hydrALAZINE  100 mg Oral TID  . isosorbide mononitrate  15 mg Oral Daily  . metoCLOPramide (REGLAN) injection  5 mg Intravenous Q8H  . zolpidem  5 mg Oral QHS   Continuous Infusions: . magnesium sulfate 1 - 4 g bolus IVPB     PRN Meds:.acetaminophen **OR** acetaminophen, albuterol, ALPRAZolam, guaiFENesin-dextromethorphan, hydrALAZINE, magic mouthwash, magnesium sulfate 1 - 4 g bolus IVPB, metoprolol tartrate, morphine injection, ondansetron (ZOFRAN) IV **OR** promethazine, phenol, polyethylene glycol, potassium chloride   PHYSICAL EXAM: Vital signs: Vitals:   01/21/18 2008 01/22/18 0300 01/22/18 0400 01/22/18 0909  BP:   (!) 158/56 (!) 169/55  Pulse:   74 79  Resp:   18 16  Temp:   98.4 F (36.9 C)   TempSrc:   Oral   SpO2: 99% 99% 96% 98%  Weight:      Height:       Filed Weights   01/16/18 2334 01/19/18 0555 01/21/18 0500  Weight: 63.5 kg (140 lb) 66.4 kg (146 lb 6.2 oz) 66.8 kg (147 lb 4.3 oz)   Body mass index is 24.51 kg/m.   General appearance:Awake, alert, not in any distress.  Eyes:no scleral icterus. HEENT: Atraumatic and Normocephalic Neck: supple, no JVD. Resp:Good air entry bilaterally,no rales or rhonchi CVS: S1 S2 regular, no murmurs.  GI: Bowel sounds present, Non tender and not distended with no gaurding, rigidity or rebound. Extremities: B/L Lower Ext shows no edema, both legs are warm to touch Neurology:  Non focal Psychiatric: Normal judgment and insight. Normal mood. Musculoskeletal:No digital cyanosis Skin:No Rash, warm and dry Wounds:N/A  I have personally reviewed following labs and imaging studies  LABORATORY DATA: CBC: Recent Labs  Lab 01/19/18 0432 01/20/18 0446 01/20/18 0803 01/21/18 0406 01/22/18 0410  WBC 26.9* 22.2* 20.3* 15.4* 14.2*  NEUTROABS  --   --  18.6*  --   --   HGB 8.8* 11.6* 11.0* 9.3* 9.7*  HCT 26.1* 35.0* 33.2* 27.8* 28.9*  MCV 89.4 87.1 87.4  87.7 88.1  PLT 177 131* 116* 101* 136*    Basic Metabolic Panel: Recent Labs  Lab 01/17/18 2200  01/19/18 0432 01/20/18 0446 01/20/18 0803 01/21/18 0406 01/22/18 0410  NA 141   < > 142 139 137 132* 134*  K 5.8*   < > 4.4 4.4 4.3 4.0 4.0  CL 108   < > 107 104 103 96* 94*  CO2 21*   < > 23 24 23 25 27   GLUCOSE 244*   < > 51* 75 93 152* 110*  BUN 21*   < > 40* 39* 39* 38* 42*  CREATININE 2.43*   < > 3.86* 3.99* 4.00* 4.31* 4.30*  CALCIUM 7.7*   < > 7.8* 8.0* 7.8* 7.8* 8.4*  MG 1.5*  --   --   --   --   --   --   PHOS  --   --   --  5.0*  --   --   --    < > = values in this interval not displayed.    GFR: Estimated Creatinine Clearance: 11.9 mL/min (  A) (by C-G formula based on SCr of 4.3 mg/dL (H)).  Liver Function Tests: Recent Labs  Lab 01/17/18 0331 01/20/18 0446 01/20/18 0803 01/21/18 0406 01/22/18 0410  AST 13*  --  37 25 22  ALT 9*  --  <5* <5* <5*  ALKPHOS 42  --  54 51 62  BILITOT 0.5  --  1.2 1.8* 2.1*  PROT 5.2*  --  5.0* 4.6* 5.0*  ALBUMIN 2.6* 2.4* 2.3* 1.9* 1.9*   No results for input(s): LIPASE, AMYLASE in the last 168 hours. No results for input(s): AMMONIA in the last 168 hours.  Coagulation Profile: Recent Labs  Lab 01/17/18 0331 01/17/18 2200  INR 1.05 1.22    Cardiac Enzymes: No results for input(s): CKTOTAL, CKMB, CKMBINDEX, TROPONINI in the last 168 hours.  BNP (last 3 results) No results for input(s): PROBNP in the last 8760 hours.  HbA1C: Recent Labs    01/22/18 0702  HGBA1C 5.2    CBG: Recent Labs  Lab 01/21/18 1617 01/21/18 1956 01/21/18 2332 01/22/18 0407 01/22/18 0757  GLUCAP 140* 145* 134* 116* 111*    Lipid Profile: No results for input(s): CHOL, HDL, LDLCALC, TRIG, CHOLHDL, LDLDIRECT in the last 72 hours.  Thyroid Function Tests: No results for input(s): TSH, T4TOTAL, FREET4, T3FREE, THYROIDAB in the last 72 hours.  Anemia Panel: No results for input(s): VITAMINB12, FOLATE, FERRITIN, TIBC, IRON,  RETICCTPCT in the last 72 hours.  Urine analysis:    Component Value Date/Time   COLORURINE YELLOW 01/16/2018 2355   APPEARANCEUR CLEAR 01/16/2018 2355   LABSPEC 1.009 01/16/2018 2355   PHURINE 6.0 01/16/2018 2355   GLUCOSEU NEGATIVE 01/16/2018 2355   HGBUR NEGATIVE 01/16/2018 2355   BILIRUBINUR NEGATIVE 01/16/2018 2355   KETONESUR NEGATIVE 01/16/2018 2355   PROTEINUR NEGATIVE 01/16/2018 2355   UROBILINOGEN 0.2 03/04/2011 2107   NITRITE NEGATIVE 01/16/2018 2355   LEUKOCYTESUR NEGATIVE 01/16/2018 2355    Sepsis Labs: Lactic Acid, Venous    Component Value Date/Time   LATICACIDVEN 0.9 01/20/2018 1023    MICROBIOLOGY: Recent Results (from the past 240 hour(s))  Urine C&S     Status: None   Collection Time: 01/14/18 10:46 AM  Result Value Ref Range Status   Specimen Description   Final    URINE, RANDOM Performed at [facility], [place] [address] [place], [place] [address]    Special Requests   Final    NONE Performed at [facility], [place] [address] [place], [place] [address]    Culture   Final    NO GROWTH Performed at [facility], [place] [address], [place], [place] [address]    Report Status 01/15/2018 FINAL  Final  Surgical PCR screen     Status: None   Collection Time: 01/16/18 11:59 PM  Result Value Ref Range Status   MRSA, PCR NEGATIVE NEGATIVE Final   Staphylococcus aureus NEGATIVE NEGATIVE Final    Comment: (NOTE) The Xpert SA Assay (FDA approved for NASAL specimens in patients 14 years of age and older), is one component of a comprehensive surveillance program. It is not intended to diagnose infection nor to guide or monitor treatment. Performed at [facility], [place] [address], [place], [place] [address]     RADIOLOGY STUDIES/RESULTS: Ct Abdomen Pelvis Wo Contrast  Result Date: 01/19/2018 CLINICAL DATA:  Prior endovascular aortic repair 2 days ago. Dropping hemoglobin. Abdominal pain. EXAM: CT ABDOMEN  AND PELVIS WITHOUT CONTRAST TECHNIQUE: Multidetector CT imaging of the  abdomen and pelvis was performed following the standard protocol without IV contrast. COMPARISON:  CT 01/14/2018 FINDINGS: Lower chest: Small left pleural effusion and trace right effusion, new since prior study. Mild cardiomegaly. Densely calcified coronary arteries with prior CABG changes. Dependent bibasilar atelectasis. Hepatobiliary: Layering high-density material within the gallbladder, likely a combination of small stones and vicarious excretion of contrast. No focal hepatic abnormality. Pancreas: No focal abnormality or ductal dilatation. Spleen: No focal abnormality.  Normal size. Adrenals/Urinary Tract: Renovascular calcifications in the renal hila bilaterally. Prior bilateral renal artery stenting. No hydronephrosis. No adrenal mass. Urinary bladder is unremarkable Foley catheter in place. Stomach/Bowel: Stomach, large and small bowel grossly unremarkable. Vascular/Lymphatic: Changes of prior stent graft repair of abdominal aortic aneurysm. Maximum aneurysm sac size 4.9 cm compared to 5.4 cm previously, small amount of gas noted within the aneurysm sac external to the stent graft. Stent graft extends into the common iliac arteries bilaterally. Reproductive: Prior hysterectomy.  No adnexal masses. Other: Large left retroperitoneal hematoma, measuring approximately 9.2 x 7.9 cm on image 57. This extends over approximately of 18 cm length in craniocaudal do direction from just below the left kidney extending inferiorly into the lower pelvis to the left of the urinary bladder. No free fluid or free air. Musculoskeletal: No acute bony abnormality. IMPRESSION: Interval stent graft repair of AAA. Aneurysm sac size now 4.9 cm maximally compared to 5.4 cm previously. Large left retroperitoneal hematoma measuring 18 x 9.2 x 7.9 cm. Small left pleural effusion and trace right pleural effusion. Bibasilar atelectasis. These results will be called  to the ordering clinician or representative by the Radiologist Assistant, and communication documented in the PACS or zVision Dashboard. Electronically Signed   By: Rolm Baptise M.D.   On: 01/19/2018 10:04   Ct Abdomen Pelvis Wo Contrast  Addendum Date: 01/18/2018   ADDENDUM REPORT: 01/18/2018 15:46 IMPRESSION: 1. Increased size of abdominal aortic aneurysm, now 5.4 centimeters. Recommend followup by abdomen and pelvis CTA in 3-6 months, and vascular surgery referral/consultation if not already obtained. This recommendation follows ACR consensus guidelines: White Paper of the ACR Incidental Findings Committee II on Vascular Findings. J Am Coll Radiol 2013; 10:789-794. Electronically Signed   By: Nolon Nations M.D.   On: 01/18/2018 15:46   Result Date: 01/18/2018 CLINICAL DATA:  Patient presents with left-sided abdominal pain. She states that she been sick over the last month. Hx AAA EXAM: CT ABDOMEN AND PELVIS WITHOUT CONTRAST TECHNIQUE: Multidetector CT imaging of the abdomen and pelvis was performed following the standard protocol without IV contrast. COMPARISON:  01/19/2012 FINDINGS: Lower chest: There are nonspecific subpleural reticular changes at the lung bases. No focal consolidations or pleural effusions. There is dense coronary artery calcification. Minimal pericardial calcification. No pericardial effusions. Hepatobiliary: There is layering hyperdense material within the gallbladder, raising the question of calculi or sludge. No CT evidence for acute cholecystitis. The liver is homogeneous without focal mass. Pancreas: Unremarkable. No pancreatic ductal dilatation or surrounding inflammatory changes. Spleen: Normal in size without focal abnormality. Adrenals/Urinary Tract: Adrenal glands are normal in appearance. There are numerous intrarenal calculi in bilaterally. No hydronephrosis or ureteral stones. Urinary bladder is unremarkable. Stomach/Bowel: The stomach and small bowel loops are normal in  appearance. Appendectomy. Sutures are identified in the UPPER rectum consistent with remote anastomosis. No colonic mass or inflammation. There are rare colonic diverticula. Vascular/Lymphatic: There is dense atherosclerotic calcification of the abdominal aorta. Aorta is aneurysmal, measuring 5.4 x 4.7 centimeters. No retroperitoneal or mesenteric adenopathy. LEFT  iliac artery aneurysm is 2.1 centimeters. RIGHT femoral artery aneurysm is 1.6 centimeters. Reproductive: Hysterectomy.  No adnexal mass. Other: No free pelvic fluid. Anterior abdominal wall is unremarkable. Musculoskeletal: No acute or significant osseous findings. IMPRESSION: 1. Increased size of abdominal aortic aneurysm, now measuring 5.4 centimeters. Recommend followup by ultrasound in 1 year. This recommendation follows ACR consensus guidelines: White Paper of the ACR Incidental Findings Committee II on Vascular Findings. J Am Coll Radiol 2013; 10:789-794. 2. 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 3. Coronary artery disease. 4. Gallbladder stones or hyperdense sludge. 5. Nephrolithiasis.  No urinary tract obstruction. 6. Previous hysterectomy. Electronically Signed: By: Nolon Nations M.D. On: 01/14/2018 16:10   Dg Chest 2 View  Result Date: 01/14/2018 CLINICAL DATA:  Shortness of breath, cough and congestion. History of bronchitis, hypertension, CAD/post CABG, CHF, diabetes and smoking EXAM: CHEST - 2 VIEW COMPARISON:  10/06/2015; 07/04/2015; 03/21/2013 FINDINGS: Grossly unchanged cardiac silhouette and mediastinal contours post median sternotomy and CABG. Atherosclerotic plaque within the thoracic aorta. Lungs remain hyperexpanded with flattening of the diaphragms mild diffuse slightly nodular thickening of the pulmonary interstitium. There is minimal pleuroparenchymal thickening about the bilateral major fissures. No evidence of edema. No pleural effusion or pneumothorax. No acute osseus abnormalities. IMPRESSION:  Similar findings of lung hyperexpansion and chronic bronchitic change without superimposed acute cardiopulmonary disease. Electronically Signed   By: Sandi Mariscal M.D.   On: 01/14/2018 14:28   Dg Chest Port 1 View  Result Date: 01/22/2018 CLINICAL DATA:  Shortness of breath EXAM: PORTABLE CHEST 1 VIEW COMPARISON:  Chest radiograph 01/17/2018. FINDINGS: Stable cardiomegaly status post median sternotomy. Bilateral interstitial pulmonary opacities. No large area pulmonary consolidation. No pleural effusion or pneumothorax. IMPRESSION: Mild bilateral interstitial pulmonary opacities raising the possibility of edema. Electronically Signed   By: Lovey Newcomer M.D.   On: 01/22/2018 10:16   Dg Chest Port 1 View  Result Date: 01/17/2018 CLINICAL DATA:  S/p AAA repair using bifurcation graft EXAM: PORTABLE CHEST 1 VIEW COMPARISON:  Chest x-ray dated 01/14/2018. FINDINGS: Mild cardiomegaly is stable. Overall cardiomediastinal silhouette is stable. Aortic atherosclerosis. Lungs are clear. No pleural effusion or pneumothorax seen. No acute or suspicious osseous finding. Median sternotomy wires appear intact and stable in alignment. IMPRESSION: No active disease.  No evidence of pneumonia or pulmonary edema. Electronically Signed   By: Franki Cabot M.D.   On: 01/17/2018 21:29     LOS: 6 days   Oren Binet, MD  [facility]  If 7PM-7AM, please contact night-coverage  Please page via [email or web address] [email or web address]-click on MD name and type text message  01/22/2018, 10:23 AM

## 2018-01-22 NOTE — Progress Notes (Signed)
Subjective:  BP stable on norvasc 10/hydral 100 TID and clonidine 0.3 TID  now 2400 of UOP with lasix 40 IV bid - creatinine stable- "about the same"   Objective Vital signs in last 24 hours: Vitals:   01/21/18 2000 01/21/18 2008 01/22/18 0300 01/22/18 0400  BP: (!) 181/64   (!) 158/56  Pulse: 90   74  Resp: 20   18  Temp: 99.7 F (37.6 C)   98.4 F (36.9 C)  TempSrc: Oral   Oral  SpO2: 97% 99% 99% 96%  Weight:      Height:       Weight change:   Intake/Output Summary (Last 24 hours) at 01/22/2018 0828 Last data filed at 01/22/2018 1914 Gross per 24 hour  Intake 120 ml  Output 2400 ml  Net -2280 ml   Assessment/Plan: 67 year old WF- DM, HTN , 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  Initially oliguria in the setting of post op from stent graft repair of AAA- now UOP fairly normal- on lasix. No absolute indications for HD - labs fairly stable - not sure if AVF would be usable as an aside.  Of note- pt had husband who started on dialysis and dies shortly thereafter so that is weighing heavy on her mind- still no indications today 2. Hypertension/volume  - appears to have diff to control BP at baseline given her med list- currently on norvasc 10/clonidine 0.3 TID/hydralazine 100 TID- at home on coreg as well not giving here.   now checking BP on arm so likely more accurate- BP 150-160 not bad for her.  Had really good UOP on lasix 40 BID and no edema- possibly post injury diuresis- will stop lasix and follow   3. Anemia  - s/p transfusion 5/14- hgb was up- now back down- retroperitoneal hematoma.  Supportive care but have also added ESA  4. Hyperkalemia- improved 5. AAA- s/p stent graft repair- per [facility]- possibly an ileus or some evidence of CES- - follow clinical course     [name]    Labs: Basic Metabolic  Panel: Recent Labs  Lab 01/20/18 0446 01/20/18 0803 01/21/18 0406 01/22/18 0410  NA 139 137 132* 134*  K 4.4 4.3 4.0 4.0  CL 104 103 96* 94*  CO2 24 23 25 27   GLUCOSE 75 93 152* 110*  BUN 39* 39* 38* 42*  CREATININE 3.99* 4.00* 4.31* 4.30*  CALCIUM 8.0* 7.8* 7.8* 8.4*  PHOS 5.0*  --   --   --    Liver Function Tests: Recent Labs  Lab 01/20/18 0803 01/21/18 0406 01/22/18 0410  AST 37 25 22  ALT <5* <5* <5*  ALKPHOS 54 51 62  BILITOT 1.2 1.8* 2.1*  PROT 5.0* 4.6* 5.0*  ALBUMIN 2.3* 1.9* 1.9*   No results for input(s): LIPASE, AMYLASE in the last 168 hours. No results for input(s): AMMONIA in the last 168 hours. CBC: Recent Labs  Lab 01/19/18 0432 01/20/18 0446 01/20/18 0803 01/21/18 0406 01/22/18 0410  WBC 26.9* 22.2* 20.3* 15.4* 14.2*  NEUTROABS  --   --  18.6*  --   --   HGB 8.8* 11.6* 11.0* 9.3* 9.7*  HCT 26.1* 35.0* 33.2* 27.8* 28.9*  MCV 89.4 87.1 87.4 87.7 88.1  PLT 177 131* 116* 101* 136*   Cardiac Enzymes: No results  for input(s): CKTOTAL, CKMB, CKMBINDEX, TROPONINI in the last 168 hours. CBG: Recent Labs  Lab 01/21/18 1617 01/21/18 1956 01/21/18 2332 01/22/18 0407 01/22/18 0757  GLUCAP 140* 145* 134* 116* 111*    Iron Studies: No results for input(s): IRON, TIBC, TRANSFERRIN, FERRITIN in the last 72 hours. Studies/Results: No results found. Medications: Infusions: . magnesium sulfate 1 - 4 g bolus IVPB      Scheduled Medications: . albuterol  2.5 mg Nebulization TID  . amLODipine  10 mg Oral Daily  . bisacodyl  10 mg Rectal Once  . cholecalciferol  5,000 Units Oral Daily  . cloNIDine  0.3 mg Transdermal Weekly  . cloNIDine  0.3 mg Oral TID  . clopidogrel  75 mg Oral Q breakfast  . darbepoetin (ARANESP) injection - NON-DIALYSIS  150 mcg Subcutaneous Q Thu-1800  . DULoxetine  60 mg Oral Daily  . famotidine  20 mg Oral QHS  . furosemide  40 mg Intravenous BID  . hydrALAZINE  100 mg Oral TID  . isosorbide mononitrate  15 mg Oral  Daily  . metoCLOPramide (REGLAN) injection  5 mg Intravenous Q8H  . zolpidem  5 mg Oral QHS    have reviewed scheduled and prn medications.  Physical Exam: General:  Looks better Heart: RRR Lungs: mostly clear Abdomen: diffusely tender Extremities: no edema- dusky toes, non painful  Dialysis Access: left AVF  Placed 2/29- slightly small    01/22/2018,8:28 AM  LOS: 6 days

## 2018-01-22 NOTE — Evaluation (Signed)
Physical Therapy Evaluation Patient Details Name: [name] MRN: [id number] DOB: 10-31-1950 Today's Date: 01/22/2018   History of Present Illness    67 y.o. female with prior history of diabetes, hypertension, chronic kidney disease stage IV, CAD status post CABG, carotid artery stenosis status post carotid endarterectomy, COPD on nocturnal home O2 admitted by vascular surgery for endovascular repair of AAA-which was done on 5/14, postoperative course complicated by development of retroperitoneal hematoma, worsening renal function and hypoglycemia.  Hospitalist service assisting in management of multiple issues.   Clinical Impression  Pt presents with moderate limitations to functional mobility due to weakness, pain, cardiopulmonary dysfunction, contributing to dependencies in transfers and ambulation.  Limited today due to fatigue.  Recommend SNF for ST rehab at d/c to address deficits and return pt to maximum independence.  PT will initiate care in hospital.  See care plan for goals of care.    Follow Up Recommendations SNF    Equipment Recommendations  Rolling walker with 5" wheels    Recommendations for Other Services       Precautions / Restrictions Precautions Precautions: Fall Restrictions Weight Bearing Restrictions: No      Mobility  Bed Mobility Overal bed mobility: Independent                Transfers Overall transfer level: Needs assistance   Transfers: Sit to/from Stand Sit to Stand: Supervision;Min guard(for safety as needed/sleepy)            Ambulation/Gait             General Gait Details: deferred today due to achy and sleepy, but pt appear able to cover short distances;  suggest try without then with device  Stairs            Wheelchair Mobility    Modified Rankin (Stroke Patients Only)       Balance Overall balance assessment: No apparent balance deficits (not formally assessed)                                            Pertinent Vitals/Pain Pain Assessment: 0-10 Pain Score: 6  Pain Location: general achy Pain Descriptors / Indicators: Aching Pain Intervention(s): Limited activity within patient's tolerance;Monitored during session    Home Living Family/patient expects to be discharged to:: Private residence Living Arrangements: Alone Available Help at Discharge: Other (Comment)(unsure, need to further assess) Type of Home: House Home Access: Level entry     Home Layout: One level Home Equipment: None      Prior Function Level of Independence: Independent               Hand Dominance   Dominant Hand: Right    Extremity/Trunk Assessment   Upper Extremity Assessment Upper Extremity Assessment: Defer to OT evaluation    Lower Extremity Assessment Lower Extremity Assessment: Generalized weakness;Overall [facility] for tasks assessed    Cervical / Trunk Assessment Cervical / Trunk Assessment: Normal  Communication   Communication: No difficulties  Cognition Arousal/Alertness: Lethargic Behavior During Therapy: WFL for tasks assessed/performed;Flat affect Overall Cognitive Status: Within Functional Limits for tasks assessed                                 General Comments: very sleepy, able to cooperate/participate but falls asleep as soon as back in bed  General Comments      Exercises     Assessment/Plan    PT Assessment Patient needs continued PT services  PT Problem List Decreased strength;Pain;Cardiopulmonary status limiting activity;Decreased knowledge of use of DME;Decreased mobility;Decreased activity tolerance       PT Treatment Interventions DME instruction;Gait training;Functional mobility training;Therapeutic activities;Therapeutic exercise;Patient/family education    PT Goals (Current goals can be found in the Care Plan section)  Acute Rehab PT Goals Patient Stated Goal: home independent PT Goal Formulation: With  patient Time For Goal Achievement: 02/05/18 Potential to Achieve Goals: Good    Frequency Min 3X/week   Barriers to discharge Decreased caregiver support lives alone    Co-evaluation               AM-PAC PT "6 Clicks" Daily Activity  Outcome Measure Difficulty turning over in bed (including adjusting bedclothes, sheets and blankets)?: A Little Difficulty moving from lying on back to sitting on the side of the bed? : A Little Difficulty sitting down on and standing up from a chair with arms (e.g., wheelchair, bedside commode, etc,.)?: A Little Help needed moving to and from a bed to chair (including a wheelchair)?: A Little Help needed walking in hospital room?: A Little Help needed climbing 3-5 steps with a railing? : A Lot 6 Click Score: 17    End of Session Equipment Utilized During Treatment: Oxygen Activity Tolerance: Patient tolerated treatment well;Patient limited by fatigue;Patient limited by lethargy Patient left: in bed;with call bell/phone within reach;with SCD's reapplied Nurse Communication: Mobility status PT Visit Diagnosis: Unsteadiness on feet (R26.81);Muscle weakness (generalized) (M62.81);Difficulty in walking, not elsewhere classified (R26.2)    Time: 4562-5638 PT Time Calculation (min) (ACUTE ONLY): 23 min   Charges:   PT Evaluation $PT Eval Moderate Complexity: 1 Mod PT Treatments $Therapeutic Activity: 8-22 mins   PT G Codes:        Kearney Hard, PT, DPT, MS Board Certified Geriatric Clinical Specialist  Herbie Drape 01/22/2018, 1:18 PM

## 2018-01-22 NOTE — Progress Notes (Addendum)
  Progress Note    01/22/2018 8:57 AM 5 Days Post-Op  Subjective:  Feels her breathing is a little bit better.  Says she got out of bed yesterday  Tm 99.7 now afebrile HR 70's-90's NSR 094'B-096'G systolic 83% 6OQ9UT  Vitals:   01/22/18 0300 01/22/18 0400  BP:  (!) 158/56  Pulse:  74  Resp:  18  Temp:  98.4 F (36.9 C)  SpO2: 99% 96%    Physical Exam: Cardiac:  regular Lungs:  Non labored Incisions:  Bilateral groins are soft Extremities:  2+ right DP and 1+ left DP pulse Abdomen:  Diffusely tender but soft  CBC    Component Value Date/Time   WBC 14.2 (H) 01/22/2018 0410   RBC 3.28 (L) 01/22/2018 0410   HGB 9.7 (L) 01/22/2018 0410   HCT 28.9 (L) 01/22/2018 0410   PLT 136 (L) 01/22/2018 0410   MCV 88.1 01/22/2018 0410   MCH 29.6 01/22/2018 0410   MCHC 33.6 01/22/2018 0410   RDW 14.8 01/22/2018 0410   LYMPHSABS 0.3 (L) 01/20/2018 0803   MONOABS 0.9 01/20/2018 0803   EOSABS 0.0 01/20/2018 0803   BASOSABS 0.0 01/20/2018 0803    BMET    Component Value Date/Time   NA 134 (L) 01/22/2018 0410   K 4.0 01/22/2018 0410   CL 94 (L) 01/22/2018 0410   CO2 27 01/22/2018 0410   GLUCOSE 110 (H) 01/22/2018 0410   BUN 42 (H) 01/22/2018 0410   CREATININE 4.30 (H) 01/22/2018 0410   CREATININE 1.32 (H) 06/17/2015 1257   CALCIUM 8.4 (L) 01/22/2018 0410   GFRNONAA 10 (L) 01/22/2018 0410   GFRAA 12 (L) 01/22/2018 0410    INR    Component Value Date/Time   INR 1.22 01/17/2018 2200   INR 1.8 11/09/2010 1358     Intake/Output Summary (Last 24 hours) at 01/22/2018 0857 Last data filed at 01/22/2018 6546 Gross per 24 hour  Intake 120 ml  Output 2400 ml  Net -2280 ml     Assessment:  67 y.o. female is s/p:  EVAR  5 Days Post-Op  Plan: -she continues to require 4LO2-per report was on intermittent home O2.  Feels she is breathing a little better -mobilized some yesterday out of bed-continue today.  Continue IS every hour.  PT consult ordered  yesterday -creatinine at 4.3 today with good UOP.  (unchanged from yesterday) -leukocytosis continue to improve and down to 14k -acute blood loss anemia stable and slightly improved to 9.7 -DVT prophylaxis:  SCD's   Leontine Locket, PA-C [facility] [phone] 01/22/2018 8:57 AM  I have examined the patient, reviewed and agree with above.  Curt Jews, MD 01/22/2018 10:37 AM

## 2018-01-23 ENCOUNTER — Ambulatory Visit: Payer: Self-pay | Admitting: Surgery

## 2018-01-23 LAB — RENAL FUNCTION PANEL
Albumin: 1.9 g/dL — ABNORMAL LOW (ref 3.5–5.0)
Anion gap: 17 — ABNORMAL HIGH (ref 5–15)
BUN: 45 mg/dL — ABNORMAL HIGH (ref 6–20)
CHLORIDE: 91 mmol/L — AB (ref 101–111)
CO2: 24 mmol/L (ref 22–32)
CREATININE: 3.69 mg/dL — AB (ref 0.44–1.00)
Calcium: 8.7 mg/dL — ABNORMAL LOW (ref 8.9–10.3)
GFR, EST AFRICAN AMERICAN: 14 mL/min — AB (ref >60)
GFR, EST NON AFRICAN AMERICAN: 12 mL/min — AB (ref >60)
Glucose, Bld: 103 mg/dL — ABNORMAL HIGH (ref 65–99)
Phosphorus: 4.3 mg/dL (ref 2.5–4.6)
Potassium: 4.1 mmol/L (ref 3.5–5.1)
Sodium: 132 mmol/L — ABNORMAL LOW (ref 135–145)

## 2018-01-23 LAB — GLUCOSE, CAPILLARY
GLUCOSE-CAPILLARY: 107 mg/dL — AB (ref 65–99)
GLUCOSE-CAPILLARY: 115 mg/dL — AB (ref 65–99)
GLUCOSE-CAPILLARY: 128 mg/dL — AB (ref 65–99)
GLUCOSE-CAPILLARY: 146 mg/dL — AB (ref 65–99)
GLUCOSE-CAPILLARY: 170 mg/dL — AB (ref 65–99)
Glucose-Capillary: 135 mg/dL — ABNORMAL HIGH (ref 65–99)

## 2018-01-23 LAB — CBC
HEMATOCRIT: 30 % — AB (ref 36.0–46.0)
Hemoglobin: 9.8 g/dL — ABNORMAL LOW (ref 12.0–15.0)
MCH: 29.5 pg (ref 26.0–34.0)
MCHC: 32.7 g/dL (ref 30.0–36.0)
MCV: 90.4 fL (ref 78.0–100.0)
Platelets: 192 10*3/uL (ref 150–400)
RBC: 3.32 MIL/uL — ABNORMAL LOW (ref 3.87–5.11)
RDW: 14.6 % (ref 11.5–15.5)
WBC: 13.3 10*3/uL — AB (ref 4.0–10.5)

## 2018-01-23 MED ORDER — ALBUTEROL SULFATE (2.5 MG/3ML) 0.083% IN NEBU: 2.5000 mg | INHALATION_SOLUTION | Freq: Four times a day (QID) | RESPIRATORY_TRACT | Status: DC | PRN

## 2018-01-23 MED ORDER — HEPARIN SODIUM (PORCINE) 5000 UNIT/ML IJ SOLN
5000.0000 [IU] | Freq: Three times a day (TID) | INTRAMUSCULAR | Status: DC
  Administered 2018-01-23 – 2018-01-27 (×12): 5000 [IU] via SUBCUTANEOUS
  Filled 2018-01-23 (×12): qty 1

## 2018-01-23 NOTE — Plan of Care (Signed)
  Problem: Education: Goal: Knowledge of General Education information will improve Outcome: Progressing   Problem: Health Behavior/Discharge Planning: Goal: Ability to manage health-related needs will improve Outcome: Progressing   

## 2018-01-23 NOTE — Progress Notes (Signed)
Clinical Social Worker following patient for support and discharge plan. CSW left a list of facilities that are able to take patients insurance at bedside. CSW also contacted patients daughter [name] and gave her a list via phone. [name] stated she does not want to make a decision until her sister has had time to look at the facilities available.   [name], MSW,  [place]

## 2018-01-23 NOTE — Progress Notes (Addendum)
  Progress Note    01/23/2018 7:17 AM 6 Days Post-Op  Subjective:  Says she's sore but no different from yesterday.  Says she is passing gas; walked to the nurses station yesterday.  Afebrile HR 60's-80's NSR 833'A-250'N systolic 39% 7QB3AL (down from 4LO2NC)  Vitals:   01/22/18 2138 01/23/18 0338  BP: (!) 174/61 (!) 151/53  Pulse:  70  Resp:  18  Temp: 98.2 F (36.8 C) 98.4 F (36.9 C)  SpO2:  98%    Physical Exam: Cardiac:  regular Lungs:  Non labored Incisions:  Bilateral groins are soft without hematoma Extremities:  Bilateral feet are warm Abdomen:  Soft; not as tender as days past; +flatus  CBC    Component Value Date/Time   WBC 13.3 (H) 01/23/2018 0419   RBC 3.32 (L) 01/23/2018 0419   HGB 9.8 (L) 01/23/2018 0419   HCT 30.0 (L) 01/23/2018 0419   PLT 192 01/23/2018 0419   MCV 90.4 01/23/2018 0419   MCH 29.5 01/23/2018 0419   MCHC 32.7 01/23/2018 0419   RDW 14.6 01/23/2018 0419   LYMPHSABS 0.3 (L) 01/20/2018 0803   MONOABS 0.9 01/20/2018 0803   EOSABS 0.0 01/20/2018 0803   BASOSABS 0.0 01/20/2018 0803    BMET    Component Value Date/Time   NA 132 (L) 01/23/2018 0419   K 4.1 01/23/2018 0419   CL 91 (L) 01/23/2018 0419   CO2 24 01/23/2018 0419   GLUCOSE 103 (H) 01/23/2018 0419   BUN 45 (H) 01/23/2018 0419   CREATININE 3.69 (H) 01/23/2018 0419   CREATININE 1.32 (H) 06/17/2015 1257   CALCIUM 8.7 (L) 01/23/2018 0419   GFRNONAA 12 (L) 01/23/2018 0419   GFRAA 14 (L) 01/23/2018 0419    INR    Component Value Date/Time   INR 1.22 01/17/2018 2200   INR 1.8 11/09/2010 1358     Intake/Output Summary (Last 24 hours) at 01/23/2018 0717 Last data filed at 01/23/2018 0300 Gross per 24 hour  Intake 300 ml  Output 1151 ml  Net -851 ml     Assessment:  67 y.o. female is s/p:  EVAR  6 Days Post-Op  Plan: -pt down to 3LO2NC and O2 sats while I was in the room at 100% -creatinine improving and down to 3.69 (down from 4.3) -leukocytosis continues  to improve -acute blood loss anemia stable with hgb at 9.8 -PT recommending PT -DVT prophylaxis:  SCD's - these were not on when I went in the room and discussed with pt that she needs to have these on    Leontine Locket, PA-C [facility] [phone] 01/23/2018 7:17 AM

## 2018-01-23 NOTE — NC FL2 (Signed)
[place] MEDICAID FL2 LEVEL OF CARE SCREENING TOOL     IDENTIFICATION  Patient Name: [name] Birthdate: 07/14/1951 Sex: female Admission Date (Current Location): 01/16/2018  [facility] and [place] Number:  Herbalist and Address:  The . [facility] [place], [place] [address], [place],  [address]      Provider Number: [id number]  Attending Physician Name and Address:  Serafina Mitchell, MD  Relative Name and Phone Number:  Kirke Shaggy, [phone]    Current Level of Care: Hospital Recommended Level of Care: [place] Prior Approval Number:    Date Approved/Denied:   PASRR Number: [id number]  Discharge Plan: SNF    Current Diagnoses: Patient Active Problem List   Diagnosis Date Noted  . Hypoglycemia 01/19/2018  . Type II diabetes mellitus ([place]) 01/19/2018  . Nausea with vomiting 01/19/2018  . Uncontrolled hypertension 01/19/2018  . Hyperlipidemia 01/19/2018  . CAD (coronary artery disease) 01/19/2018  . COPD with hypoxia ([place]) 01/19/2018  . Chronic kidney disease (CKD), stage IV (severe) ([place]) 01/19/2018  . Retroperitoneal hematoma 01/19/2018  . Acute blood loss anemia 01/19/2018  . Chronic diastolic congestive heart failure ([place]) 01/19/2018  . AAA (abdominal aortic aneurysm) ([place]) 01/16/2018  . Elevated brain natriuretic peptide (BNP) level 10/07/2015  . Diabetes mellitus ([place]) 10/07/2015  . Pain in the chest   . PAD (peripheral artery disease) ([place]) 07/29/2015  . Chest pain 07/04/2015  . Acute on chronic kidney failure ([place]) 07/04/2015  . GERD (gastroesophageal reflux disease) 07/04/2015  . Anemia 06/26/2015  . Coronary artery disease involving autologous vein coronary bypass graft with unstable angina pectoris ([place]) 06/24/2015  . Angina at rest [facility])   . Subclavian artery stenosis, left ([place]) 06/23/2015  . AAA (abdominal aortic aneurysm) without rupture ([place]) 12/10/2014  . Subclavian steal syndrome 12/10/2014  .  Renal artery stenosis ([place]) 09/04/2013  . Carotid artery stenosis 09/28/2012  . Mitral valve disorder 06/23/2011  . S/P CABG x 3 06/16/2011  . S/P Maze operation for atrial fibrillation 06/16/2011  . S/P MVR (mitral valve repair) 06/16/2011  . Follow-up examination following surgery 03/29/2011  . CKD (chronic kidney disease) 03/03/2011  . COPD (chronic obstructive pulmonary disease) ([place]) 03/03/2011  . Chronic diastolic heart failure ([place]) 02/26/2011  . Hypotension 02/26/2011  . NSTEMI (non-ST elevated myocardial infarction) ([place]) 02/26/2011  . Long term (current) use of anticoagulants 12/17/2010  . OBSTRUCTIVE SLEEP APNEA 09/04/2010  . HYPOXEMIA 09/04/2010  . SNORING 08/05/2010  . Atrial fibrillation ([place]) 12/09/2009  . WEIGHT LOSS 03/28/2009  . PALPITATIONS 03/28/2009  . PVD 11/21/2008  . HYPERLIPIDEMIA-MIXED 11/20/2008  . TOBACCO ABUSE 11/20/2008  . Essential hypertension 11/20/2008  . Coronary artery disease involving native heart with angina pectoris ([place]) 11/20/2008  . ABDOMINAL AORTIC ANEURYSM 11/20/2008    Orientation RESPIRATION BLADDER Height & Weight     Self, Time, Situation, Place  O2(2L) Continent Weight: 147 lb 4.3 oz (66.8 kg) Height:  5\' 5"  (165.1 cm)  BEHAVIORAL SYMPTOMS/MOOD NEUROLOGICAL BOWEL NUTRITION STATUS      Continent Diet(renal with fluid restriction)  AMBULATORY STATUS COMMUNICATION OF NEEDS Skin   Limited Assist Verbally Normal                       Personal Care Assistance Level of Assistance  Bathing, Feeding, Dressing Bathing Assistance: Limited assistance Feeding assistance: Independent Dressing Assistance: Limited assistance     Functional Limitations Info  Sight, Hearing, Speech Sight Info: Adequate Hearing Info: Adequate  Speech Info: Adequate    SPECIAL CARE FACTORS FREQUENCY  PT (By licensed PT), OT (By licensed OT)     PT Frequency: 5x wk OT Frequency: 5x wk            Contractures Contractures Info: Not present     Additional Factors Info  Allergies, Code Status Code Status Info: prior Allergies Info: ISOSORBIDE, CODEINE, CONTRAST MEDIA IODINATED DIAGNOSTIC AGENTS, IOXAGLATE, METRIZAMIDE, PERCOCET OXYCODONE-ACETAMINOPHEN            Current Medications (01/23/2018):  This is the current hospital active medication list Current Facility-Administered Medications  Medication Dose Route Frequency Provider Last Rate Last Dose  . acetaminophen (TYLENOL) tablet 325-650 mg  325-650 mg Oral Q4H PRN Rhyne, Samantha J, PA-C       Or  . acetaminophen (TYLENOL) suppository 325-650 mg  325-650 mg Rectal Q4H PRN Rhyne, Samantha J, PA-C      . albuterol (PROVENTIL) (2.5 MG/3ML) 0.083% nebulizer solution 2.5 mg  2.5 mg Inhalation Q4H PRN Rhyne, Samantha J, PA-C   2.5 mg at 01/20/18 0938  . albuterol (PROVENTIL) (2.5 MG/3ML) 0.083% nebulizer solution 2.5 mg  2.5 mg Nebulization TID Serafina Mitchell, MD   2.5 mg at 01/23/18 0852  . ALPRAZolam Duanne Moron) tablet 0.25 mg  0.25 mg Oral QHS PRN Rhyne, Samantha J, PA-C      . amLODipine (NORVASC) tablet 10 mg  10 mg Oral Daily Rhyne, Samantha J, PA-C   10 mg at 01/23/18 1023  . cholecalciferol (VITAMIN D) tablet 5,000 Units  5,000 Units Oral Daily Gabriel Earing, PA-C   5,000 Units at 01/23/18 1023  . cloNIDine (CATAPRES - Dosed in mg/24 hr) patch 0.3 mg  0.3 mg Transdermal Weekly Estanislado Emms, MD   0.3 mg at 01/19/18 1600  . cloNIDine (CATAPRES) tablet 0.3 mg  0.3 mg Oral TID Corliss [place], MD   0.3 mg at 01/23/18 1021  . clopidogrel (PLAVIX) tablet 75 mg  75 mg Oral Q breakfast Rhyne, Samantha J, PA-C   75 mg at 01/23/18 1024  . Darbepoetin Alfa (ARANESP) injection 150 mcg  150 mcg Subcutaneous Q Thu-1800 Corliss Parish, MD   150 mcg at 01/19/18 2253  . DULoxetine (CYMBALTA) DR capsule 60 mg  60 mg Oral Daily Rhyne, Samantha J, PA-C   60 mg at 01/23/18 1022  . famotidine (PEPCID) tablet 20 mg  20 mg Oral QHS Susa Raring, RPH   20 mg at 01/22/18 2138   . guaiFENesin-dextromethorphan (ROBITUSSIN DM) 100-10 MG/5ML syrup 15 mL  15 mL Oral Q4H PRN Rhyne, Samantha J, PA-C      . hydrALAZINE (APRESOLINE) injection 10 mg  10 mg Intravenous Q2H PRN Estanislado Emms, MD   10 mg at 01/20/18 1100  . hydrALAZINE (APRESOLINE) tablet 100 mg  100 mg Oral TID Rhyne, Samantha J, PA-C   100 mg at 01/23/18 1022  . isosorbide mononitrate (IMDUR) 24 hr tablet 15 mg  15 mg Oral Daily Rhyne, Samantha J, PA-C   15 mg at 01/23/18 1019  . magic mouthwash  15 mL Oral QID PRN Rhyne, Samantha J, PA-C      . magnesium sulfate IVPB 2 g 50 mL  2 g Intravenous Daily PRN Rhyne, Samantha J, PA-C      . metoCLOPramide (REGLAN) injection 5 mg  5 mg Intravenous Q8H Samella Parr, NP   5 mg at 01/23/18 0521  . metoprolol tartrate (LOPRESSOR) injection 2-5 mg  2-5 mg Intravenous Q2H PRN  Rhyne, Samantha J, PA-C      . morphine 2 MG/ML injection 2 mg  2 mg Intravenous Q2H PRN Gabriel Earing, PA-C   2 mg at 01/21/18 2147  . ondansetron (ZOFRAN) injection 4 mg  4 mg Intravenous Q6H PRN Samella Parr, NP       Or  . promethazine (PHENERGAN) injection 12.5 mg  12.5 mg Intravenous Q6H PRN Samella Parr, NP   12.5 mg at 01/20/18 2136  . phenol (CHLORASEPTIC) mouth spray 1 spray  1 spray Mouth/Throat PRN Rhyne, Samantha J, PA-C      . polyethylene glycol (MIRALAX / GLYCOLAX) packet 17 g  17 g Oral Daily PRN Rhyne, Samantha J, PA-C      . potassium chloride SA (K-DUR,KLOR-CON) CR tablet 20-40 mEq  20-40 mEq Oral Daily PRN Rhyne, Samantha J, PA-C      . traMADol (ULTRAM) tablet 50-100 mg  50-100 mg Oral Q6H PRN Early, Arvilla Meres, MD   100 mg at 01/22/18 2138  . zolpidem (AMBIEN) tablet 5 mg  5 mg Oral QHS Rhyne, Samantha J, PA-C   5 mg at 01/22/18 2138     Discharge Medications: Please see discharge summary for a list of discharge medications.  Relevant Imaging Results:  Relevant Lab Results:   Additional Information SS# [id number]  Wende Neighbors, LCSW

## 2018-01-23 NOTE — Care Management Important Message (Signed)
Important Message  Patient Details  Name: [name] MRN: [id number] Date of Birth: [birth year]   Medicare Important Message Given:  Yes    [name] 01/23/2018, 3:44 PM

## 2018-01-23 NOTE — Progress Notes (Addendum)
[facility] Rounding Note  Background: 67 year old WF- DM, HTN , COPD, PAD, AF, HLD, CAD, PAD, CKD stage 4 at baseline.  Presented with semi urgent stent graft repair of AAA- done 5/14, then RP bleed/ABLA needing transfusion. AKI on CKD 4 2/2 ischemic nephropathy.  1. AKI on CKD4 - known baseline stage 4 CKD- crt mid to high 2's  likely secondary to ischemic nephropathy. Initially oliguria in the setting of post op from stent graft repair of AAA.  Peak creatinine 4.3, today down to 3.69 BUN 45, K and acid base fine. No indication for dialysis. As an aside not sure if AVF would be usable... 2. Vascular access - by exam L AVF would not be usable for HD. Ask [facility]VS to opine while here in the hospital   3. Hypertension  - appears to have diff to control BP at baseline given her med list- currently on norvasc 10/clonidine 0.3 TID/hydralazine 100 TID- at home on coreg as well not giving here.  Now checking BP on arm so likely more accurate- BP 150-160 not bad for her.  Had really good UOP on lasix 40 BID and no edema - possibly post injury diuresis- lasix was stopped as of 2PM dose 5/19. Watch volume.  4. Anemia  - ABLA for surgery then retroperitoneal hematoma.  Appears total of 7U since admission. Supportive care but have also added Aranesp 150/week. 5. 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  Objective Vital signs in last 24 hours: Vitals:   01/23/18 1300 01/23/18 1354 01/23/18 1500 01/23/18 1623  BP:    (!) 153/58  Pulse: (!) 42  70 68  Resp:    16  Temp:    98.3 F (36.8 C)  TempSrc:    Oral  SpO2: 97% 98% 98% 96%  Weight:      Height:        Weight change:   Intake/Output Summary (Last 24 hours) at 01/23/2018 1631 Last data filed at 01/23/2018 1500 Gross per 24 hour  Intake 480 ml  Output 1651 ml  Net -1171 ml   Physical Exam: Very nice lady  NAD VS as noted Regular rhythm S1S2 no S3 Lungs ant clear Mild diffuse abd tenderness (she says not as bad as it was yesterday). No rebound. + BS. Loud abd bruit.  Seems worse in LLQ No LE edema, feet warm Dialysis Access: left AVF (2/29/19)- slightly small,  bruit fades to nothing in mid upper arm  Recent Labs  Lab 01/20/18 0446  01/21/18 0406 01/22/18 0410 01/23/18 0419  NA 139   < > 132* 134* 132*  K 4.4   < > 4.0 4.0 4.1  CL 104   < > 96* 94* 91*  CO2 24   < > 25 27 24   GLUCOSE 75   < > 152* 110* 103*  BUN 39*   < > 38* 42* 45*  CREATININE 3.99*   < > 4.31* 4.30* 3.69*  CALCIUM 8.0*   < > 7.8* 8.4* 8.7*  PHOS 5.0*  --   --   --  4.3   < > =  values in this interval not displayed.    Recent Labs  Lab 01/20/18 0803 01/21/18 0406 01/22/18 0410 01/23/18 0419  AST 37 25 22  --   ALT <5* <5* <5*  --   ALKPHOS 54 51 62  --   BILITOT 1.2 1.8* 2.1*  --   PROT 5.0* 4.6* 5.0*  --   ALBUMIN 2.3* 1.9* 1.9* 1.9*    Recent Labs  Lab 01/20/18 0446 01/20/18 0803 01/21/18 0406 01/22/18 0410 01/23/18 0419  WBC 22.2* 20.3* 15.4* 14.2* 13.3*  NEUTROABS  --  18.6*  --   --   --   HGB 11.6* 11.0* 9.3* 9.7* 9.8*  HCT 35.0* 33.2* 27.8* 28.9* 30.0*  MCV 87.1 87.4 87.7 88.1 90.4  PLT 131* 116* 101* 136* 192    Recent Labs  Lab 01/23/18 0028 01/23/18 0343 01/23/18 0812 01/23/18 1118 01/23/18 1619  GLUCAP 128* 115* 107* 146* 135*    Studies/Results: Dg Chest Port 1 View  Result Date: 01/22/2018 CLINICAL DATA:  Shortness of breath EXAM: PORTABLE CHEST 1 VIEW COMPARISON:  Chest radiograph 01/17/2018. FINDINGS: Stable cardiomegaly status post median sternotomy. Bilateral interstitial pulmonary opacities. No large area pulmonary consolidation. No pleural effusion  or pneumothorax. IMPRESSION: Mild bilateral interstitial pulmonary opacities raising the possibility of edema. Electronically Signed   By: Lovey Newcomer M.D.   On: 01/22/2018 10:16   Medications: Infusions: . magnesium sulfate 1 - 4 g bolus IVPB      Scheduled Medications: . albuterol  2.5 mg Nebulization TID  . amLODipine  10 mg Oral Daily  . cholecalciferol  5,000 Units Oral Daily  . cloNIDine  0.3 mg Transdermal Weekly  . cloNIDine  0.3 mg Oral TID  . clopidogrel  75 mg Oral Q breakfast  . darbepoetin (ARANESP) injection - NON-DIALYSIS  150 mcg Subcutaneous Q Thu-1800  . DULoxetine  60 mg Oral Daily  . famotidine  20 mg Oral QHS  . hydrALAZINE  100 mg Oral TID  . isosorbide mononitrate  15 mg Oral Daily  . metoCLOPramide (REGLAN) injection  5 mg Intravenous Q8H  . zolpidem  5 mg Oral QHS   Jamal Maes, MD [facility] [phone] Pager 01/23/2018, 4:35 PM

## 2018-01-23 NOTE — Clinical Social Work Note (Signed)
Clinical Social Work Assessment  Patient Details  Name: [name] MRN: [id number] Date of Birth: 06-14-1951  Date of referral:  01/23/18               Reason for consult:  Facility Placement, Discharge Planning                Permission sought to share information with:  Family Supports Permission granted to share information::  Yes, Verbal Permission Granted  Name::     [name]  Agency::  snf  Relationship::  daughter  Contact Information:  [phone]  Housing/Transportation Living arrangements for the past 2 months:  Apartment Source of Information:  Patient, Adult Children Patient Interpreter Needed:  None Criminal Activity/Legal Involvement Pertinent to Current Situation/Hospitalization:  No - Comment as needed Significant Relationships:  Adult Children Lives with:  Self Do you feel safe going back to the place where you live?  Yes Need for family participation in patient care:  Yes (Comment)  Care giving concerns: Patients daughter [name] was at bedside for support. Patient stated she lives by herself and is agreeable to discharge to facility for short term rehab.  Social Worker assessment / plan:  CSW met patient and daughter [name]) at bedside to discuss discharge plans. Patient stated she would like to go to short term rehab. [name] stated she would like at least 24/hr notice prior to discharge so her and sister are able to look into a rehab facility for patient. CSW stated she will fax patient out to surrounding facilities and will let family know once beds are available.  Employment status:  Retired Nurse, adult PT Recommendations:  [place] / Referral to community resources:  [place]  Patient/Family's Response to care: Family supportive of patient and patients needs  Patient/Family's Understanding of and Emotional Response to Diagnosis, Current Treatment, and Prognosis:  CSW will  follow back up with family once bed offers are available    Emotional Assessment Appearance:  Appears older than stated age Attitude/Demeanor/Rapport:  Engaged Affect (typically observed):  Accepting Orientation:  Oriented to Place, Oriented to Self, Oriented to Situation, Oriented to  Time Alcohol / Substance use:  Not Applicable Psych involvement (Current and /or in the community):  No (Comment)  Discharge Needs  Concerns to be addressed:  No discharge needs identified Readmission within the last 30 days:  No Current discharge risk:  None Barriers to Discharge:  No SNF bed   Wende Neighbors, LCSW 01/23/2018, 12:42 PM

## 2018-01-24 LAB — BASIC METABOLIC PANEL
ANION GAP: 12 (ref 5–15)
BUN: 46 mg/dL — ABNORMAL HIGH (ref 6–20)
CALCIUM: 8.8 mg/dL — AB (ref 8.9–10.3)
CHLORIDE: 92 mmol/L — AB (ref 101–111)
CO2: 29 mmol/L (ref 22–32)
Creatinine, Ser: 3.27 mg/dL — ABNORMAL HIGH (ref 0.44–1.00)
GFR calc Af Amer: 16 mL/min — ABNORMAL LOW (ref >60)
GFR calc non Af Amer: 14 mL/min — ABNORMAL LOW (ref >60)
Glucose, Bld: 120 mg/dL — ABNORMAL HIGH (ref 65–99)
Potassium: 3.8 mmol/L (ref 3.5–5.1)
Sodium: 133 mmol/L — ABNORMAL LOW (ref 135–145)

## 2018-01-24 LAB — GLUCOSE, CAPILLARY
GLUCOSE-CAPILLARY: 106 mg/dL — AB (ref 65–99)
GLUCOSE-CAPILLARY: 154 mg/dL — AB (ref 65–99)
GLUCOSE-CAPILLARY: 170 mg/dL — AB (ref 65–99)
Glucose-Capillary: 130 mg/dL — ABNORMAL HIGH (ref 65–99)
Glucose-Capillary: 135 mg/dL — ABNORMAL HIGH (ref 65–99)
Glucose-Capillary: 139 mg/dL — ABNORMAL HIGH (ref 65–99)

## 2018-01-24 LAB — RENAL FUNCTION PANEL
ANION GAP: 11 (ref 5–15)
Albumin: 1.9 g/dL — ABNORMAL LOW (ref 3.5–5.0)
BUN: 46 mg/dL — ABNORMAL HIGH (ref 6–20)
CO2: 30 mmol/L (ref 22–32)
Calcium: 8.8 mg/dL — ABNORMAL LOW (ref 8.9–10.3)
Chloride: 92 mmol/L — ABNORMAL LOW (ref 101–111)
Creatinine, Ser: 3.25 mg/dL — ABNORMAL HIGH (ref 0.44–1.00)
GFR calc Af Amer: 16 mL/min — ABNORMAL LOW (ref >60)
GFR calc non Af Amer: 14 mL/min — ABNORMAL LOW (ref >60)
GLUCOSE: 119 mg/dL — AB (ref 65–99)
POTASSIUM: 3.8 mmol/L (ref 3.5–5.1)
Phosphorus: 2.8 mg/dL (ref 2.5–4.6)
SODIUM: 133 mmol/L — AB (ref 135–145)

## 2018-01-24 LAB — CBC
HEMATOCRIT: 29.4 % — AB (ref 36.0–46.0)
HEMOGLOBIN: 9.5 g/dL — AB (ref 12.0–15.0)
MCH: 29.1 pg (ref 26.0–34.0)
MCHC: 32.3 g/dL (ref 30.0–36.0)
MCV: 89.9 fL (ref 78.0–100.0)
Platelets: 251 10*3/uL (ref 150–400)
RBC: 3.27 MIL/uL — ABNORMAL LOW (ref 3.87–5.11)
RDW: 14.5 % (ref 11.5–15.5)
WBC: 9.1 10*3/uL (ref 4.0–10.5)

## 2018-01-24 LAB — IRON AND TIBC
Iron: 23 ug/dL — ABNORMAL LOW (ref 28–170)
SATURATION RATIOS: 11 % (ref 10.4–31.8)
TIBC: 210 ug/dL — ABNORMAL LOW (ref 250–450)
UIBC: 187 ug/dL

## 2018-01-24 NOTE — Progress Notes (Signed)
[facility] Rounding Note  Background: 67 year old WF- DM, HTN , COPD, PAD, AF, HLD, CAD, PAD, CKD stage 4 at baseline (admission creatinine 2.29, March 2019 was 2.82) .  Presented with semi urgent stent graft repair of AAA done 5/14, then RP bleed/ABLA needing transfusion. AKI on CKD 4 2/2 ischemic nephropathy.   1. AKI on CKD4 - known baseline stage 4 CKD- crt mid to high 2's probable ischemic nephropathy. Initially oliguric post op from stent graft repair of AAA.  Peak creatinine 4.3, today down to 3.25 so clear trend.  K and acid base fine. No indication for dialysis. As an aside not sure if AVF would be usable...was to have had duplex of AVF on 5/9 had to miss. 2. Vascular access - by exam L AVF would not be usable for HD. Ask VVS to opine while here in the hospital   3. Hypertension  - Diff to control BP at baseline/multiple meds - currently on norvasc 10/clonidine 0.3 TID/hydralazine 100 TID- at home on coreg as well not giving here. BP 150-160 not bad for her.  Had really good UOP on lasix 40 BID and no edema - possibly post injury diuresis- lasix was held/stopped as of 2PM dose 5/19.  4. Anemia  - ABLA for surgery then retroperitoneal hematoma.  Appears total of 7U transfused since admission. Supportive care but have also added Aranesp 150/week, dosed on 01/19/18. 5. AAA- s/p stent graft repair 01/17/18 - per VVS  Subjective:  Still complains of pain Afraid to ask for pain meds because was seeing things yesterday  Able to ambulate with a walker  Objective Vital signs in last 24 hours: Vitals:   01/23/18 1623 01/23/18 1941 01/24/18 0546 01/24/18 0801  BP: (!) 153/58 (!) 145/52 (!) 152/36   Pulse: 68 80 69   Resp: 16 16 16    Temp: 98.3 F (36.8 C) 98.9 F (37.2 C) 98.8 F (37.1 C) 98.3 F (36.8 C)  TempSrc: Oral Oral Oral Oral  SpO2: 96% 95% 97%   Weight:      Height:       Weight change:   Intake/Output Summary (Last 24 hours) at 01/24/2018 1117 Last data filed at 01/23/2018  1850 Gross per 24 hour  Intake 720 ml  Output 600 ml  Net 120 ml   Physical Exam: Very nice lady  NAD VS as noted, [place] O2 at 2L Regular rhythm S1S2 no S3 Lungs ant clear Still with mild LLQ discomfort to palpation No LE edema, feet warm Dialysis Access: left AVF (2/29/19) small,  bruit fades to nothing in mid upper arm  Recent Labs  Lab 01/20/18 0446  01/22/18 0410 01/23/18 0419 01/24/18 0406  NA 139   < > 134* 132* 133*  133*  K 4.4   < > 4.0 4.1 3.8  3.8  CL 104   < > 94* 91* 92*  92*  CO2 24   < > 27 24 29  30   GLUCOSE 75   < > 110* 103* 120*  119*  BUN 39*   < > 42* 45* 46*  46*  CREATININE 3.99*   < > 4.30* 3.69* 3.27*  3.25*  CALCIUM 8.0*   < > 8.4* 8.7* 8.8*  8.8*  PHOS 5.0*  --   --  4.3 2.8   < > = values in this interval not displayed.    Recent Labs  Lab 01/20/18 0803 01/21/18 0406 01/22/18 0410 01/23/18 0419 01/24/18 0406  AST 37 25  22  --   --   ALT <5* <5* <5*  --   --   ALKPHOS 54 51 62  --   --   BILITOT 1.2 1.8* 2.1*  --   --   PROT 5.0* 4.6* 5.0*  --   --   ALBUMIN 2.3* 1.9* 1.9* 1.9* 1.9*    Recent Labs  Lab 01/20/18 0803 01/21/18 0406 01/22/18 0410 01/23/18 0419 01/24/18 0406  WBC 20.3* 15.4* 14.2* 13.3* 9.1  NEUTROABS 18.6*  --   --   --   --   HGB 11.0* 9.3* 9.7* 9.8* 9.5*  HCT 33.2* 27.8* 28.9* 30.0* 29.4*  MCV 87.4 87.7 88.1 90.4 89.9  PLT 116* 101* 136* 192 251    Recent Labs  Lab 01/23/18 1619 01/23/18 1945 01/24/18 0016 01/24/18 0423 01/24/18 0740  GLUCAP 135* 170* 135* 130* 106*   Medications: Infusions: . magnesium sulfate 1 - 4 g bolus IVPB      Scheduled Medications: . amLODipine  10 mg Oral Daily  . cholecalciferol  5,000 Units Oral Daily  . cloNIDine  0.3 mg Transdermal Weekly  . cloNIDine  0.3 mg Oral TID  . clopidogrel  75 mg Oral Q breakfast  . darbepoetin (ARANESP) injection - NON-DIALYSIS  150 mcg Subcutaneous Q Thu-1800  . DULoxetine  60 mg Oral Daily  . famotidine  20 mg Oral QHS  .  heparin injection (subcutaneous)  5,000 Units Subcutaneous Q8H  . hydrALAZINE  100 mg Oral TID  . isosorbide mononitrate  15 mg Oral Daily  . metoCLOPramide (REGLAN) injection  5 mg Intravenous Q8H  . zolpidem  5 mg Oral QHS   Jamal Maes, MD [facility] [phone] Pager 01/24/2018, 11:17 AM

## 2018-01-24 NOTE — Progress Notes (Signed)
Patient ambulated 40 feet in room with walker, brushed teeth and hair and is sitting in chair for breakfast. She is minimally shaky and c/o feeling weak but willing and tolerated moderately well this morning.

## 2018-01-24 NOTE — Progress Notes (Addendum)
  Progress Note    01/24/2018 7:34 AM 7 Days Post-Op  Subjective:  Up with walker walking to the bathroom.  Says she's not eating much.  Still having pain  Afebrile HR 60's-70's NSR [id number]-941'D systolic 408% [id number]  Vitals:   01/23/18 1941 01/24/18 0546  BP: (!) 145/52 (!) 152/36  Pulse: 80 69  Resp: 16 16  Temp: 98.9 F (37.2 C) 98.8 F (37.1 C)  SpO2: 95% 97%    Physical Exam: General:  No distress walking to bathroom Otherwise, not examined at this time due to going to restroom  CBC    Component Value Date/Time   WBC 9.1 01/24/2018 0406   RBC 3.27 (L) 01/24/2018 0406   HGB 9.5 (L) 01/24/2018 0406   HCT 29.4 (L) 01/24/2018 0406   PLT 251 01/24/2018 0406   MCV 89.9 01/24/2018 0406   MCH 29.1 01/24/2018 0406   MCHC 32.3 01/24/2018 0406   RDW 14.5 01/24/2018 0406   LYMPHSABS 0.3 (L) 01/20/2018 0803   MONOABS 0.9 01/20/2018 0803   EOSABS 0.0 01/20/2018 0803   BASOSABS 0.0 01/20/2018 0803    BMET    Component Value Date/Time   NA 133 (L) 01/24/2018 0406   NA 133 (L) 01/24/2018 0406   K 3.8 01/24/2018 0406   K 3.8 01/24/2018 0406   CL 92 (L) 01/24/2018 0406   CL 92 (L) 01/24/2018 0406   CO2 30 01/24/2018 0406   CO2 29 01/24/2018 0406   GLUCOSE 119 (H) 01/24/2018 0406   GLUCOSE 120 (H) 01/24/2018 0406   BUN 46 (H) 01/24/2018 0406   BUN 46 (H) 01/24/2018 0406   CREATININE 3.25 (H) 01/24/2018 0406   CREATININE 3.27 (H) 01/24/2018 0406   CREATININE 1.32 (H) 06/17/2015 1257   CALCIUM 8.8 (L) 01/24/2018 0406   CALCIUM 8.8 (L) 01/24/2018 0406   GFRNONAA 14 (L) 01/24/2018 0406   GFRNONAA 14 (L) 01/24/2018 0406   GFRAA 16 (L) 01/24/2018 0406   GFRAA 16 (L) 01/24/2018 0406    INR    Component Value Date/Time   INR 1.22 01/17/2018 2200   INR 1.8 11/09/2010 1358     Intake/Output Summary (Last 24 hours) at 01/24/2018 0734 Last data filed at 01/23/2018 1850 Gross per 24 hour  Intake 960 ml  Output 1000 ml  Net -40 ml     Assessment:  67 y.o.  female is s/p:  EVAR  7 Days Post-Op  Plan: -pt down to 2LO2NC; continue mobilization and oob -creatinine again improved from yesterday -leukocytosis improved and is now back to normal -acute blood loss anemia-hgb remains stable -DVT prophylaxis:  SCD's   Leontine Locket, PA-C [facility] [phone] 01/24/2018 7:34 AM  Agree with the above.  I have seen and examined the patient.  I started her back on SQ heparin last night as she is not wearing the SCD's  I will follow her Hb.  Fistula duplex pending.  COntinue to mobilize.  Renal function improving.  [name]

## 2018-01-24 NOTE — Progress Notes (Signed)
Mrs Mazor ambulated 250 feet in hallway with 3 short standing stops to rest. She tolerated well and is back in bed to rest per her request.

## 2018-01-24 NOTE — Progress Notes (Signed)
Physical Therapy Treatment Patient Details Name: [name] MRN: [id number] DOB: [birth year] Today's Date: 01/24/2018    History of Present Illness 67 y.o. female with prior history of diabetes, hypertension, chronic kidney disease stage IV, CAD status post CABG, carotid artery stenosis status post carotid endarterectomy, COPD on nocturnal home O2 admitted by vascular surgery for endovascular repair of AAA-which was done on 5/14, postoperative course complicated by development of retroperitoneal hematoma, worsening renal function and hypoglycemia.    PT Comments    Pt making steady progress.   Follow Up Recommendations  SNF     Equipment Recommendations  Other (comment)(rollator)    Recommendations for Other Services       Precautions / Restrictions Precautions Precautions: Fall Restrictions Weight Bearing Restrictions: No    Mobility  Bed Mobility Overal bed mobility: Independent;Needs Assistance Bed Mobility: Supine to Sit;Sit to Sidelying     Supine to sit: Min assist   Sit to sidelying: Min assist General bed mobility comments: Assist to elevate trunk into sitting and assist to bring legs back up into bed returning to sidelying  Transfers Overall transfer level: Needs assistance   Transfers: Sit to/from Stand Sit to Stand: Min assist         General transfer comment: assist for bring hips up  Ambulation/Gait Ambulation/Gait assistance: Min assist Ambulation Distance (Feet): 125 Feet Assistive device: 4-wheeled walker Gait Pattern/deviations: Step-through pattern;Decreased stride length;Trunk flexed Gait velocity: decr Gait velocity interpretation: 1.31 - 2.62 ft/sec, indicative of limited community ambulator General Gait Details: assist for balance. Amb on RA with SpO2 > 90% but with slight drop to 88% when returned to sitting EOB.    Stairs             Wheelchair Mobility    Modified Rankin (Stroke Patients Only)       Balance Overall  balance assessment: Needs assistance Sitting-balance support: No upper extremity supported;Feet supported Sitting balance-Leahy Scale: Good     Standing balance support: Single extremity supported Standing balance-Leahy Scale: Poor Standing balance comment: walker and min guard for static standing                            Cognition Arousal/Alertness: Awake/alert Behavior During Therapy: WFL for tasks assessed/performed;Flat affect Overall Cognitive Status: Within Functional Limits for tasks assessed                                        Exercises      General Comments        Pertinent Vitals/Pain Pain Assessment: Faces Faces Pain Scale: Hurts even more Pain Location: abdomen/flank with transitional movements Pain Descriptors / Indicators: Grimacing Pain Intervention(s): Limited activity within patient's tolerance;Monitored during session;Repositioned    Home Living                      Prior Function            PT Goals (current goals can now be found in the care plan section) Acute Rehab PT Goals Patient Stated Goal: home independent Progress towards PT goals: Progressing toward goals    Frequency    Min 2X/week      PT Plan Frequency needs to be updated    Co-evaluation              AM-PAC PT "6 Clicks" Daily  Activity  Outcome Measure  Difficulty turning over in bed (including adjusting bedclothes, sheets and blankets)?: A Little Difficulty moving from lying on back to sitting on the side of the bed? : Unable Difficulty sitting down on and standing up from a chair with arms (e.g., wheelchair, bedside commode, etc,.)?: Unable Help needed moving to and from a bed to chair (including a wheelchair)?: A Little Help needed walking in hospital room?: A Little Help needed climbing 3-5 steps with a railing? : A Lot 6 Click Score: 13    End of Session Equipment Utilized During Treatment: Gait belt Activity  Tolerance: Patient tolerated treatment well Patient left: in bed;with call bell/phone within reach Nurse Communication: Mobility status PT Visit Diagnosis: Unsteadiness on feet (R26.81);Muscle weakness (generalized) (M62.81);Difficulty in walking, not elsewhere classified (R26.2)     Time: 4580-9983 PT Time Calculation (min) (ACUTE ONLY): 20 min  Charges:  $Gait Training: 8-22 mins                    G Codes:       [facility] PT [place] 01/24/2018, 12:50 PM

## 2018-01-25 ENCOUNTER — Inpatient Hospital Stay (HOSPITAL_COMMUNITY): Payer: Medicare Other

## 2018-01-25 DIAGNOSIS — I714 Abdominal aortic aneurysm, without rupture: Secondary | ICD-10-CM

## 2018-01-25 LAB — BASIC METABOLIC PANEL
Anion gap: 13 (ref 5–15)
BUN: 45 mg/dL — AB (ref 6–20)
CHLORIDE: 93 mmol/L — AB (ref 101–111)
CO2: 29 mmol/L (ref 22–32)
Calcium: 8.9 mg/dL (ref 8.9–10.3)
Creatinine, Ser: 2.89 mg/dL — ABNORMAL HIGH (ref 0.44–1.00)
GFR calc Af Amer: 19 mL/min — ABNORMAL LOW (ref >60)
GFR, EST NON AFRICAN AMERICAN: 16 mL/min — AB (ref >60)
Glucose, Bld: 130 mg/dL — ABNORMAL HIGH (ref 65–99)
POTASSIUM: 3.6 mmol/L (ref 3.5–5.1)
SODIUM: 135 mmol/L (ref 135–145)

## 2018-01-25 LAB — GLUCOSE, CAPILLARY
GLUCOSE-CAPILLARY: 130 mg/dL — AB (ref 65–99)
GLUCOSE-CAPILLARY: 169 mg/dL — AB (ref 65–99)
Glucose-Capillary: 133 mg/dL — ABNORMAL HIGH (ref 65–99)
Glucose-Capillary: 125 mg/dL — ABNORMAL HIGH (ref 65–99)
Glucose-Capillary: 125 mg/dL — ABNORMAL HIGH (ref 65–99)
Glucose-Capillary: 171 mg/dL — ABNORMAL HIGH (ref 65–99)

## 2018-01-25 LAB — CBC
HEMATOCRIT: 29 % — AB (ref 36.0–46.0)
HEMOGLOBIN: 9.5 g/dL — AB (ref 12.0–15.0)
MCH: 29.2 pg (ref 26.0–34.0)
MCHC: 32.8 g/dL (ref 30.0–36.0)
MCV: 89.2 fL (ref 78.0–100.0)
Platelets: 316 10*3/uL (ref 150–400)
RBC: 3.25 MIL/uL — AB (ref 3.87–5.11)
RDW: 14.5 % (ref 11.5–15.5)
WBC: 9 10*3/uL (ref 4.0–10.5)

## 2018-01-25 LAB — RENAL FUNCTION PANEL
ANION GAP: 14 (ref 5–15)
Albumin: 1.8 g/dL — ABNORMAL LOW (ref 3.5–5.0)
BUN: 45 mg/dL — ABNORMAL HIGH (ref 6–20)
CHLORIDE: 93 mmol/L — AB (ref 101–111)
CO2: 28 mmol/L (ref 22–32)
Calcium: 8.9 mg/dL (ref 8.9–10.3)
Creatinine, Ser: 2.9 mg/dL — ABNORMAL HIGH (ref 0.44–1.00)
GFR calc non Af Amer: 16 mL/min — ABNORMAL LOW (ref >60)
GFR, EST AFRICAN AMERICAN: 19 mL/min — AB (ref >60)
GLUCOSE: 131 mg/dL — AB (ref 65–99)
POTASSIUM: 3.5 mmol/L (ref 3.5–5.1)
Phosphorus: 2.6 mg/dL (ref 2.5–4.6)
Sodium: 135 mmol/L (ref 135–145)

## 2018-01-25 MED ORDER — DARBEPOETIN ALFA 150 MCG/0.3ML IJ SOSY
150.0000 ug | PREFILLED_SYRINGE | INTRAMUSCULAR | Status: DC
  Administered 2018-01-26: 150 ug via SUBCUTANEOUS
  Filled 2018-01-25: qty 0.3

## 2018-01-25 MED ORDER — SODIUM CHLORIDE 0.9 % IV SOLN
510.0000 mg | Freq: Once | INTRAVENOUS | Status: AC
  Administered 2018-01-25: 510 mg via INTRAVENOUS
  Filled 2018-01-25: qty 17

## 2018-01-25 MED ORDER — FUROSEMIDE 40 MG PO TABS
40.0000 mg | ORAL_TABLET | Freq: Every day | ORAL | Status: DC
  Administered 2018-01-25 – 2018-01-27 (×3): 40 mg via ORAL
  Filled 2018-01-25 (×3): qty 1

## 2018-01-25 NOTE — Progress Notes (Addendum)
  Progress Note    01/25/2018 8:09 AM 8 Days Post-Op  Subjective:  Small BM yesterday.  No new complaints per patient.  Patient states she is on 2L [place] at home   Vitals:   01/24/18 2017 01/25/18 0322  BP: (!) 163/56 (!) 171/47  Pulse: 68 68  Resp: 16 18  Temp: 99.5 F (37.5 C) 98.3 F (36.8 C)  SpO2: 92% 96%   Physical Exam: Cardiac:  RRR Lungs:  Non labored on 2L by [place] Incisions:  Groin cath sites without hematoma or palpable pseudo Extremities:  Palpable R DP; palpable L PT; palpable thrill L AV fistula near [facility] fossa only Abdomen:  Soft, somewhat tender to deep palpation Neurologic: A&O  CBC    Component Value Date/Time   WBC 9.0 01/25/2018 0327   RBC 3.25 (L) 01/25/2018 0327   HGB 9.5 (L) 01/25/2018 0327   HCT 29.0 (L) 01/25/2018 0327   PLT 316 01/25/2018 0327   MCV 89.2 01/25/2018 0327   MCH 29.2 01/25/2018 0327   MCHC 32.8 01/25/2018 0327   RDW 14.5 01/25/2018 0327   LYMPHSABS 0.3 (L) 01/20/2018 0803   MONOABS 0.9 01/20/2018 0803   EOSABS 0.0 01/20/2018 0803   BASOSABS 0.0 01/20/2018 0803    BMET    Component Value Date/Time   NA 135 01/25/2018 0327   NA 135 01/25/2018 0327   K 3.5 01/25/2018 0327   K 3.6 01/25/2018 0327   CL 93 (L) 01/25/2018 0327   CL 93 (L) 01/25/2018 0327   CO2 28 01/25/2018 0327   CO2 29 01/25/2018 0327   GLUCOSE 131 (H) 01/25/2018 0327   GLUCOSE 130 (H) 01/25/2018 0327   BUN 45 (H) 01/25/2018 0327   BUN 45 (H) 01/25/2018 0327   CREATININE 2.90 (H) 01/25/2018 0327   CREATININE 2.89 (H) 01/25/2018 0327   CREATININE 1.32 (H) 06/17/2015 1257   CALCIUM 8.9 01/25/2018 0327   CALCIUM 8.9 01/25/2018 0327   GFRNONAA 16 (L) 01/25/2018 0327   GFRNONAA 16 (L) 01/25/2018 0327   GFRAA 19 (L) 01/25/2018 0327   GFRAA 19 (L) 01/25/2018 0327    INR    Component Value Date/Time   INR 1.22 01/17/2018 2200   INR 1.8 11/09/2010 1358     Intake/Output Summary (Last 24 hours) at 01/25/2018 0809 Last data filed at 01/24/2018 2108 Gross  per 24 hour  Intake 810 ml  Output 1350 ml  Net -540 ml     Assessment/Plan:  67 y.o. female is s/p EVAR 8 Days Post-Op   L arm fistula not ready for use by physical exam; duplex pending Nephrology following; creatinine continues to improve ABL anemia: H&H stable CSW following for SNF placement Dispo: pending discharge planning and nephrology recommendations   Dagoberto Ligas, PA-C [facility] [phone] 01/25/2018 8:09 AM  Patient walking in hall Fistula duplex reveals a stenosis in the distal fistula.  She will need a fistulagram, once she recovers, possibly next week.  WElls Obbie Lewallen

## 2018-01-25 NOTE — Care Management Note (Signed)
Case Management Note Marvetta Gibbons RN, BSN Unit 4E-Case Manager [phone]  Patient Details  Name: [name] MRN: [id number] Date of Birth: 26-May-1951  Subjective/Objective:    Pt admitted s/p EVAR for AAA repair  Post op increase in creatinine- on Dopamine renal dose            Action/Plan: PTA Pt lived at home, has home 02 at baseline 2L at night, - CM to follow for transition of care needs.    Expected Discharge Date:                  Expected Discharge Plan:  Skilled Nursing Facility  In-House Referral:  Clinical Social Work  Discharge planning Services  CM Consult  Post Acute Care Choice:  NA Choice offered to:     DME Arranged:    DME Agency:     HH Arranged:    [place] Agency:     Status of Service:  In process, will continue to follow  If discussed at Long Length of Stay Meetings, dates discussed:    Discharge Disposition: skilled facility   Additional Comments:  01/25/18- 1600- Marvetta Gibbons RN, CM- CSW following for STSNF placement once medically cleared for discharge.   Dawayne Patricia, RN 01/25/2018, 4:27 PM

## 2018-01-25 NOTE — Progress Notes (Signed)
Preliminary results by tech - Left AVF completed. There is evidence of a stenosis with velocities reaching 879 cm/sec at the distal upper arm antecubital level. [name], BS, RDMS, RVT

## 2018-01-25 NOTE — Progress Notes (Signed)
[facility] Rounding Note  Background: 67 year old WF- DM, HTN , COPD, PAD, AF, HLD, CAD, PAD, CKD stage 4 at baseline (admission creatinine 2.29, March 2019 was 2.82) .  Presented with semi urgent stent graft repair of AAA done 5/14, then RP bleed/ABLA needing transfusion. AKI on CKD 4 2/2 ischemic nephropathy. Peak creatinine 4.3.  1. AKI on CKD4 - known baseline stage 4 CKD- crt mid to high 2's probable ischemic nephropathy. Initially oliguric post op from stent graft repair of AAA.  Peak creatinine 4.3, today down to 2.9. Nearing baseline. 2. Vascular access - by exam L AVF not usable for HD. VVS has ordered duplex.  3. Hypertension  - Diff to control BP at baseline/multiple meds - currently on norvasc 10/clonidine 0.3 TID/hydralazine 100 TID- at home on coreg as well not giving here. BP 150-160 not bad for her.  Had really good UOP on lasix 40 BID and no edema - possibly post injury diuresis- lasix was held/stopped as of 2PM dose 5/19. Will resume at 40 mg once a day in anticipation for discharge soon.  4. Anemia  - ABLA for surgery then retroperitoneal hematoma.  Appears total of 7U transfused since admission. Supportive care but have also added Aranesp 150/week, dosed on 01/19/18. Next dose due tomorrow. Dose with Feraheme today for TSat of 11. 5. AAA- s/p stent graft repair 01/17/18 - per VVS  Disposition: Resume lasix 40/day. Will get Aranesp redosed tomorrow. TSat 11 so have ordered dose of Feraheme.  Have arranged f/u with Dr. Elmarie Shiley at [facility]  02/22/18 at 2:30 PM. We can decide at that time if further Aranesp/iron will be needed and will follow up renal function then as well. VVS F/U for AAA repair as well as her AVF. Renal will sign off at this time. Call if questions  Jamal Maes, MD [facility] [phone] Pager 01/25/2018, 1:41 PM  Subjective:  Using walker VVS anticipating move to SNF likely net 24 hours They are also looking at her AVF upon request  (small, not  usable)  Objective Vital signs in last 24 hours: Vitals:   01/24/18 1700 01/24/18 2017 01/25/18 0322 01/25/18 0942  BP:  (!) 163/56 (!) 171/47 (!) 176/51  Pulse: 72 68 68 61  Resp:  16 18   Temp:  99.5 F (37.5 C) 98.3 F (36.8 C)   TempSrc:  Oral Oral   SpO2: 92% 92% 96% 97%  Weight:   64.7 kg (142 lb 11.2 oz)   Height:       Weight change:   Intake/Output Summary (Last 24 hours) at 01/25/2018 1341 Last data filed at 01/25/2018 1254 Gross per 24 hour  Intake 450 ml  Output 700 ml  Net -250 ml   Physical Exam: Very nice lady  NAD VS as noted, [place] O2 at 2L (home use) Regular rhythm S1S2 no S3 Lungs ant clear Minimal abd tenderness No edema, feet warm Dialysis Access: left AVF (2/29/19) small,  bruit fades to nothing in mid upper arm  Recent Labs  Lab 01/23/18 0419 01/24/18 0406 01/25/18 0327  NA 132* 133*  133* 135  135  K 4.1 3.8  3.8 3.5  3.6  CL 91* 92*  92* 93*  93*  CO2 24 29  30 28  29   GLUCOSE 103* 120*  119* 131*  130*  BUN 45* 46*  46* 45*  45*  CREATININE 3.69* 3.27*  3.25* 2.90*  2.89*  CALCIUM 8.7* 8.8*  8.8* 8.9  8.9  PHOS 4.3 2.8 2.6    Recent Labs  Lab 01/20/18 0803 01/21/18 0406 01/22/18 0410 01/23/18 0419 01/24/18 0406 01/25/18 0327  AST 37 25 22  --   --   --   ALT <5* <5* <5*  --   --   --   ALKPHOS 54 51 62  --   --   --   BILITOT 1.2 1.8* 2.1*  --   --   --   PROT 5.0* 4.6* 5.0*  --   --   --   ALBUMIN 2.3* 1.9* 1.9* 1.9* 1.9* 1.8*    Recent Labs  Lab 01/20/18 0803 01/21/18 0406 01/22/18 0410 01/23/18 0419 01/24/18 0406 01/25/18 0327  WBC 20.3* 15.4* 14.2* 13.3* 9.1 9.0  NEUTROABS 18.6*  --   --   --   --   --   HGB 11.0* 9.3* 9.7* 9.8* 9.5* 9.5*  HCT 33.2* 27.8* 28.9* 30.0* 29.4* 29.0*  MCV 87.4 87.7 88.1 90.4 89.9 89.2  PLT 116* 101* 136* 192 251 316    Recent Labs  Lab 01/24/18 2016 01/25/18 0013 01/25/18 0354 01/25/18 0953 01/25/18 1222  GLUCAP 170* 130* 133* 125* 169*    Medications: Infusions: . magnesium sulfate 1 - 4 g bolus IVPB      Scheduled Medications: . amLODipine  10 mg Oral Daily  . cholecalciferol  5,000 Units Oral Daily  . cloNIDine  0.3 mg Transdermal Weekly  . cloNIDine  0.3 mg Oral TID  . clopidogrel  75 mg Oral Q breakfast  . [START ON 01/26/2018] darbepoetin (ARANESP) injection - NON-DIALYSIS  150 mcg Subcutaneous Q Thu  . DULoxetine  60 mg Oral Daily  . famotidine  20 mg Oral QHS  . heparin injection (subcutaneous)  5,000 Units Subcutaneous Q8H  . hydrALAZINE  100 mg Oral TID  . isosorbide mononitrate  15 mg Oral Daily  . metoCLOPramide (REGLAN) injection  5 mg Intravenous Q8H  . zolpidem  5 mg Oral QHS

## 2018-01-26 LAB — GLUCOSE, CAPILLARY
GLUCOSE-CAPILLARY: 127 mg/dL — AB (ref 65–99)
GLUCOSE-CAPILLARY: 146 mg/dL — AB (ref 65–99)
GLUCOSE-CAPILLARY: 149 mg/dL — AB (ref 65–99)
Glucose-Capillary: 131 mg/dL — ABNORMAL HIGH (ref 65–99)
Glucose-Capillary: 131 mg/dL — ABNORMAL HIGH (ref 65–99)
Glucose-Capillary: 139 mg/dL — ABNORMAL HIGH (ref 65–99)
Glucose-Capillary: 149 mg/dL — ABNORMAL HIGH (ref 65–99)

## 2018-01-26 MED ORDER — BISACODYL 10 MG RE SUPP
10.0000 mg | Freq: Once | RECTAL | Status: AC
  Administered 2018-01-26: 10 mg via RECTAL
  Filled 2018-01-26: qty 1

## 2018-01-26 NOTE — Progress Notes (Signed)
Clinical Social Worker following patient for support and discharge need. Patient has bed at [facility] and authorization has been attained. CSW waiting to transition patient to facility once medically cleared.   Rhea Pink, MSW,  [place]

## 2018-01-26 NOTE — Progress Notes (Signed)
  Progress Note    01/26/2018 7:16 AM 9 Days Post-Op  Subjective:  Says when she takes a deep breath her stomach hurts.  Says she hasn't had a bowel movement in ~ 8 days.  Walked in the halls yesterday.  Tm 99.1 381'W-299'B systolic HR 71'I-96'V NSR 99% RA (back on [place] when I was in the room)  Vitals:   01/26/18 0359 01/26/18 0655  BP: (!) 177/51 (!) 165/50  Pulse: 63 62  Resp:    Temp: 99.1 F (37.3 C)   SpO2: 98% 98%    Physical Exam: Cardiac:  regular Lungs:  Non labored Extremities:  Bilateral feet are warm and well perfused Abdomen:  Soft; mildly tender to palpation  CBC    Component Value Date/Time   WBC 9.0 01/25/2018 0327   RBC 3.25 (L) 01/25/2018 0327   HGB 9.5 (L) 01/25/2018 0327   HCT 29.0 (L) 01/25/2018 0327   PLT 316 01/25/2018 0327   MCV 89.2 01/25/2018 0327   MCH 29.2 01/25/2018 0327   MCHC 32.8 01/25/2018 0327   RDW 14.5 01/25/2018 0327   LYMPHSABS 0.3 (L) 01/20/2018 0803   MONOABS 0.9 01/20/2018 0803   EOSABS 0.0 01/20/2018 0803   BASOSABS 0.0 01/20/2018 0803    BMET    Component Value Date/Time   NA 135 01/25/2018 0327   NA 135 01/25/2018 0327   K 3.5 01/25/2018 0327   K 3.6 01/25/2018 0327   CL 93 (L) 01/25/2018 0327   CL 93 (L) 01/25/2018 0327   CO2 28 01/25/2018 0327   CO2 29 01/25/2018 0327   GLUCOSE 131 (H) 01/25/2018 0327   GLUCOSE 130 (H) 01/25/2018 0327   BUN 45 (H) 01/25/2018 0327   BUN 45 (H) 01/25/2018 0327   CREATININE 2.90 (H) 01/25/2018 0327   CREATININE 2.89 (H) 01/25/2018 0327   CREATININE 1.32 (H) 06/17/2015 1257   CALCIUM 8.9 01/25/2018 0327   CALCIUM 8.9 01/25/2018 0327   GFRNONAA 16 (L) 01/25/2018 0327   GFRNONAA 16 (L) 01/25/2018 0327   GFRAA 19 (L) 01/25/2018 0327   GFRAA 19 (L) 01/25/2018 0327    INR    Component Value Date/Time   INR 1.22 01/17/2018 2200   INR 1.8 11/09/2010 1358     Intake/Output Summary (Last 24 hours) at 01/26/2018 0716 Last data filed at 01/26/2018 0300 Gross per 24 hour    Intake 480 ml  Output 725 ml  Net -245 ml     Assessment:  67 y.o. 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 -DVT prophylaxis:  SQ heparin -continue IS and mobilization   Leontine Locket, PA-C [facility] [phone] 01/26/2018 7:16 AM

## 2018-01-27 LAB — GLUCOSE, CAPILLARY
GLUCOSE-CAPILLARY: 144 mg/dL — AB (ref 65–99)
GLUCOSE-CAPILLARY: 150 mg/dL — AB (ref 65–99)
Glucose-Capillary: 134 mg/dL — ABNORMAL HIGH (ref 65–99)
Glucose-Capillary: 163 mg/dL — ABNORMAL HIGH (ref 65–99)

## 2018-01-27 NOTE — Progress Notes (Signed)
Requested 3/1 from [facility], placed order

## 2018-01-27 NOTE — Progress Notes (Signed)
B.P. 182/45 R.N. Aware and Hydralazine 10 mg I.V. given

## 2018-01-27 NOTE — Progress Notes (Signed)
B.P. 173/41 R.N. Hydralazine 10 mg I.V. Given. R.N. aware

## 2018-01-27 NOTE — Progress Notes (Addendum)
Physical Therapy Treatment Patient Details Name: [name] MRN: [id number] DOB: 12-Jun-1951 Today's Date: 01/27/2018    History of Present Illness 67 y.o. female with prior history of diabetes, hypertension, chronic kidney disease stage IV, CAD status post CABG, carotid artery stenosis status post carotid endarterectomy, COPD on nocturnal home O2 admitted by vascular surgery for endovascular repair of AAA-which was done on 5/14, postoperative course complicated by development of retroperitoneal hematoma, worsening renal function and hypoglycemia.    PT Comments    Pt doing very well with mobility. Can return home with intermittent support of family.  Follow Up Recommendations  Home health PT     Equipment Recommendations  Other (comment)(rollator)    Recommendations for Other Services       Precautions / Restrictions Precautions Precautions: Fall Restrictions Weight Bearing Restrictions: No    Mobility  Bed Mobility               General bed mobility comments: Pt up in chair  Transfers Overall transfer level: Modified independent Equipment used: None             General transfer comment: Incr time to rise  Ambulation/Gait Ambulation/Gait assistance: Modified independent (Device/Increase time) Ambulation Distance (Feet): 200 Feet Assistive device: 4-wheeled walker Gait Pattern/deviations: Step-through pattern;Decreased stride length Gait velocity: decr Gait velocity interpretation: 1.31 - 2.62 ft/sec, indicative of limited community ambulator General Gait Details: Steady gait with rollator   Stairs             Wheelchair Mobility    Modified Rankin (Stroke Patients Only)       Balance Overall balance assessment: Needs assistance Sitting-balance support: No upper extremity supported;Feet supported Sitting balance-Leahy Scale: Good     Standing balance support: No upper extremity supported Standing balance-Leahy Scale: Fair                               Cognition Arousal/Alertness: Awake/alert Behavior During Therapy: WFL for tasks assessed/performed;Flat affect Overall Cognitive Status: Within Functional Limits for tasks assessed                                        Exercises      General Comments        Pertinent Vitals/Pain Pain Assessment: Faces Faces Pain Scale: Hurts a little bit Pain Location: abdomen/flank with transitional movements Pain Descriptors / Indicators: Sore Pain Intervention(s): Limited activity within patient's tolerance;Monitored during session    Home Living                      Prior Function            PT Goals (current goals can now be found in the care plan section) Progress towards PT goals: Goals met/education completed, patient discharged from PT    Frequency    Min 2X/week      PT Plan Discharge plan needs to be updated    Co-evaluation              AM-PAC PT "6 Clicks" Daily Activity  Outcome Measure  Difficulty turning over in bed (including adjusting bedclothes, sheets and blankets)?: A Little Difficulty moving from lying on back to sitting on the side of the bed? : A Little Difficulty sitting down on and standing up from a chair with arms (e.g., wheelchair,  bedside commode, etc,.)?: A Little Help needed moving to and from a bed to chair (including a wheelchair)?: None Help needed walking in hospital room?: None Help needed climbing 3-5 steps with a railing? : A Little 6 Click Score: 20    End of Session   Activity Tolerance: Patient tolerated treatment well Patient left: in bed;with call bell/phone within reach(sitting EOB) Nurse Communication: Mobility status PT Visit Diagnosis: Unsteadiness on feet (R26.81)     Time: 7282-0601 PT Time Calculation (min) (ACUTE ONLY): 15 min  Charges:  $Gait Training: 8-22 mins                    G Codes:       [facility] PT [place] 01/27/2018, 2:27 PM

## 2018-01-27 NOTE — Progress Notes (Signed)
CSW following patient and family for support and discharge needs. CSW have spoken with patients daughter [name]  several times via phone. [name] stated she is on the fence about discharging patient to [facility] SNF due to the poor comments she has heard about the facility. [name] asked if patient had any other options and if home would be a possibility. CSW gave shannon information requested for her to make decision.  [name] stated that after considering options family has decided not to send patient to rehab but to take her home with home health to follow. RNCM aware of discharge and will assist family. CSW signing off    Rhea Pink, MSW,  [place] [phone]

## 2018-01-27 NOTE — Progress Notes (Addendum)
  Progress Note    01/27/2018 7:36 AM 10 Days Post-Op  Subjective:  No new complaints   Vitals:   01/27/18 0720 01/27/18 0725  BP:    Pulse: 66 67  Resp:    Temp:    SpO2: 97% 99%   Physical Exam: Lungs:  Non labored on 2L [place] Incisions:  B groin cath sites without palpable hematoma, some local ecchymosis Extremities:  Palpable R DP, barely palpable L DP, palpable L PT Abdomen:  Tender with deep palpation Neurologic: A&O  CBC    Component Value Date/Time   WBC 9.0 01/25/2018 0327   RBC 3.25 (L) 01/25/2018 0327   HGB 9.5 (L) 01/25/2018 0327   HCT 29.0 (L) 01/25/2018 0327   PLT 316 01/25/2018 0327   MCV 89.2 01/25/2018 0327   MCH 29.2 01/25/2018 0327   MCHC 32.8 01/25/2018 0327   RDW 14.5 01/25/2018 0327   LYMPHSABS 0.3 (L) 01/20/2018 0803   MONOABS 0.9 01/20/2018 0803   EOSABS 0.0 01/20/2018 0803   BASOSABS 0.0 01/20/2018 0803    BMET    Component Value Date/Time   NA 135 01/25/2018 0327   NA 135 01/25/2018 0327   K 3.5 01/25/2018 0327   K 3.6 01/25/2018 0327   CL 93 (L) 01/25/2018 0327   CL 93 (L) 01/25/2018 0327   CO2 28 01/25/2018 0327   CO2 29 01/25/2018 0327   GLUCOSE 131 (H) 01/25/2018 0327   GLUCOSE 130 (H) 01/25/2018 0327   BUN 45 (H) 01/25/2018 0327   BUN 45 (H) 01/25/2018 0327   CREATININE 2.90 (H) 01/25/2018 0327   CREATININE 2.89 (H) 01/25/2018 0327   CREATININE 1.32 (H) 06/17/2015 1257   CALCIUM 8.9 01/25/2018 0327   CALCIUM 8.9 01/25/2018 0327   GFRNONAA 16 (L) 01/25/2018 0327   GFRNONAA 16 (L) 01/25/2018 0327   GFRAA 19 (L) 01/25/2018 0327   GFRAA 19 (L) 01/25/2018 0327    INR    Component Value Date/Time   INR 1.22 01/17/2018 2200   INR 1.8 11/09/2010 1358     Intake/Output Summary (Last 24 hours) at 01/27/2018 0737 Last data filed at 01/26/2018 2349 Gross per 24 hour  Intake 960 ml  Output 1050 ml  Net -90 ml     Assessment/Plan:  67 y.o. female is s/p EVAR; retroperitoneal hematoma 10 Days Post-Op   -Nephrology  signed off, will follow as an outpatient next month -PT/OT recommending SNF; patient will have sister staying with her this weekend and also has support from daughters; patient considering discharge home -fistulogram can be scheduled as an outpatient -Will discuss dispo with Dr. Trula Slade  ADDENDUM: Possible discharge this afternoon after PT session and if home health arranged by case management  Dagoberto Ligas, PA-C [facility] [phone] 01/27/2018 7:37 AM

## 2018-01-27 NOTE — Care Management Note (Signed)
Case Management Note  Patient Details  Name: [name] MRN: [id number] Date of Birth: [birth year]  Subjective/Objective:                Spoke w patient daughter on the phone. They would liketo use AC for [facility], referral accepted by Ramond Marrow RN clinical liaison. They would also like a rollator. This will be delivered to room today prior to DC.     Action/Plan:   Expected Discharge Date:  01/27/18               Expected Discharge Plan:  [place]  In-House Referral:  Clinical Social Work  Discharge planning Services  CM Consult  Post Acute Care Choice:  Home Health, Durable Medical Equipment Choice offered to:  Adult Children  DME Arranged:  Walker rolling with seat DME Agency:  [place] Arranged:  PT, OT [place] Agency:  [place]  Status of Service:  Completed, signed off  If discussed at [place] of Stay Meetings, dates discussed:    Additional Comments:  Carles Collet, RN 01/27/2018, 1:41 PM

## 2018-01-27 NOTE — Progress Notes (Signed)
B.P. 183/54 R.N. Aware and Hydralazine 10 mg given I.V.

## 2018-01-27 NOTE — Care Management Important Message (Signed)
Important Message  Patient Details  Name: [name] MRN: [id number] Date of Birth: Oct 25, 1950   Medicare Important Message Given:  Yes    Hosea [place] P [place] 01/27/2018, 3:24 PM

## 2018-01-27 NOTE — Progress Notes (Signed)
Telemetry and IV discontinued. CCMD notified. Reviewed discharge instructions with patient. Pt verbalized understanding. Patient going home with bedside commode and walker from home health.  Emelda Fear, RN

## 2018-01-30 NOTE — Discharge Summary (Signed)
EVAR Discharge Summary   [name] [birth year] 67 y.o. female  MRN: [id number]  Admission Date: 01/16/2018  Discharge Date: 01/27/18  Physician: Dr. Trula Slade  Admission Diagnosis: Abd Aneurysm  Discharge Day services:    see progress note 01/27/18 Physical Exam: Vitals:   01/27/18 1025 01/27/18 1644  BP: (!) 181/57 (!) 180/43  Pulse: 73 64  Resp:    Temp: 98 F (36.7 C) 98.6 F (37 C)  SpO2: 100%     Hospital Course:  The patient was admitted to the hospital and taken to the operating room on 01/17/2018 and underwent: Endovascular repair of abdominal aortic aneurysm by Dr. Trula Slade with the use of 7cc contrast as well as CO2.    The pt tolerated the procedure well and was transported to the PACU in fair condition.   Post operatively she sustained AKI.  Nephrology was consulted.  Medications were adjusted accordingly and ultimately patient did not require initation of HD and will follow up as an outpatient with Nephrology in 1 month.  Medication recommendations can be seen on med rec page.  Post operatively patient also had hypotension requiring RBC transfusion and was found to have retroperitoneal hematoma by CT abd/pelvis.  During the course of hospital stay, the Hospitalist team was also consulted to assist with hypoglycemia in the presence of N/V and poor oral intake.  Over time p.o. Intake improved.  PT/OT recommended SNF however patient preferred [facility] PT[facility] due to her family support system.  Activity/mobility increased and HH was arranged by case management.  At the time of discharge, H&H were stable and UO had improved.  It should also be noted that L arm AV fistula was evaluated with duplex during hospitalization.  Patient will require fistulogram and possible intervention and this can be arranged as an outpatient.  She will follow up with repeat CT abd/pelvis non contrast in 2-3 weeks.  Discharge instructions were reviewed with the patient and she voiced her  understanding.  She will be discharged in stable condition.   CBC    Component Value Date/Time   WBC 9.0 01/25/2018 0327   RBC 3.25 (L) 01/25/2018 0327   HGB 9.5 (L) 01/25/2018 0327   HCT 29.0 (L) 01/25/2018 0327   PLT 316 01/25/2018 0327   MCV 89.2 01/25/2018 0327   MCH 29.2 01/25/2018 0327   MCHC 32.8 01/25/2018 0327   RDW 14.5 01/25/2018 0327   LYMPHSABS 0.3 (L) 01/20/2018 0803   MONOABS 0.9 01/20/2018 0803   EOSABS 0.0 01/20/2018 0803   BASOSABS 0.0 01/20/2018 0803    BMET    Component Value Date/Time   NA 135 01/25/2018 0327   NA 135 01/25/2018 0327   K 3.5 01/25/2018 0327   K 3.6 01/25/2018 0327   CL 93 (L) 01/25/2018 0327   CL 93 (L) 01/25/2018 0327   CO2 28 01/25/2018 0327   CO2 29 01/25/2018 0327   GLUCOSE 131 (H) 01/25/2018 0327   GLUCOSE 130 (H) 01/25/2018 0327   BUN 45 (H) 01/25/2018 0327   BUN 45 (H) 01/25/2018 0327   CREATININE 2.90 (H) 01/25/2018 0327   CREATININE 2.89 (H) 01/25/2018 0327   CREATININE 1.32 (H) 06/17/2015 1257   CALCIUM 8.9 01/25/2018 0327   CALCIUM 8.9 01/25/2018 0327   GFRNONAA 16 (L) 01/25/2018 0327   GFRNONAA 16 (L) 01/25/2018 0327   GFRAA 19 (L) 01/25/2018 0327   GFRAA 19 (L) 01/25/2018 0327         Discharge Diagnosis:  Abd Aneurysm  Secondary Diagnosis: Patient Active Problem List   Diagnosis Date Noted  . Hypoglycemia 01/19/2018  . Type II diabetes mellitus ([place]) 01/19/2018  . Nausea with vomiting 01/19/2018  . Uncontrolled hypertension 01/19/2018  . Hyperlipidemia 01/19/2018  . CAD (coronary artery disease) 01/19/2018  . COPD with hypoxia ([place]) 01/19/2018  . Chronic kidney disease (CKD), stage IV (severe) ([place]) 01/19/2018  . Retroperitoneal hematoma 01/19/2018  . Acute blood loss anemia 01/19/2018  . Chronic diastolic congestive heart failure ([place]) 01/19/2018  . AAA (abdominal aortic aneurysm) ([place]) 01/16/2018  . Elevated brain natriuretic peptide (BNP) level 10/07/2015  . Diabetes mellitus ([place]) 10/07/2015    . Pain in the chest   . PAD (peripheral artery disease) ([place]) 07/29/2015  . Chest pain 07/04/2015  . Acute on chronic kidney failure ([place]) 07/04/2015  . GERD (gastroesophageal reflux disease) 07/04/2015  . Anemia 06/26/2015  . Coronary artery disease involving autologous vein coronary bypass graft with unstable angina pectoris ([place]) 06/24/2015  . Angina at rest [facility])   . Subclavian artery stenosis, left ([place]) 06/23/2015  . AAA (abdominal aortic aneurysm) without rupture ([place]) 12/10/2014  . Subclavian steal syndrome 12/10/2014  . Renal artery stenosis ([place]) 09/04/2013  . Carotid artery stenosis 09/28/2012  . Mitral valve disorder 06/23/2011  . S/P CABG x 3 06/16/2011  . S/P Maze operation for atrial fibrillation 06/16/2011  . S/P MVR (mitral valve repair) 06/16/2011  . Follow-up examination following surgery 03/29/2011  . CKD (chronic kidney disease) 03/03/2011  . COPD (chronic obstructive pulmonary disease) ([place]) 03/03/2011  . Chronic diastolic heart failure ([place]) 02/26/2011  . Hypotension 02/26/2011  . NSTEMI (non-ST elevated myocardial infarction) ([place]) 02/26/2011  . Long term (current) use of anticoagulants 12/17/2010  . OBSTRUCTIVE SLEEP APNEA 09/04/2010  . HYPOXEMIA 09/04/2010  . SNORING 08/05/2010  . Atrial fibrillation ([place]) 12/09/2009  . WEIGHT LOSS 03/28/2009  . PALPITATIONS 03/28/2009  . PVD 11/21/2008  . HYPERLIPIDEMIA-MIXED 11/20/2008  . TOBACCO ABUSE 11/20/2008  . Essential hypertension 11/20/2008  . Coronary artery disease involving native heart with angina pectoris ([place]) 11/20/2008  . ABDOMINAL AORTIC ANEURYSM 11/20/2008   Past Medical History:  Diagnosis Date  . AAA (abdominal aortic aneurysm) ([name]) 5/09   3.7x3.3 by u/s 2009  . Abnormal EKG    deep TW inversions chronic  . Anemia   . CAD (coronary artery disease)    a. CABG 03/2011 LIMA to LAD, SVG to OM, SVG to PDA. b. cath 06/25/2015 DES to SVG to rPDA, patent LIMA to LAD, patent SVG to OM  . carotid  stenosis 5/09   S/p L CEA ;  60-79% bilat ICA by preCABG dopplers 7/12  . CHF (congestive heart failure) ([place])   . Chronic back pain    "all over" (06/24/2015)  . Chronic bronchitis ([place])    "get it pretty much q yr" (06/24/2015)  . CKD (chronic kidney disease)   . Complication of anesthesia 1966   "problem w/ether"  . COPD (chronic obstructive pulmonary disease) ([place])   . Coronary artery disease 5/09   s/p CABG 7/12   L-LAD, S-OM, S-PDA  . Depression   . GERD (gastroesophageal reflux disease)    With hiatal hernia  . GIB (gastrointestinal bleeding) 9/11   S/p EGD with cautery at [facility]  . Heart murmur   . History of blood transfusion "a few times"   "all related to anemia" (06/24/2015)  . History of hiatal hernia   . Hyperlipidemia   . Hypertension   . Mitral regurgitation  3+ MR by intraoperative TEE;  s/p MV repair with Dr. Roxy Manns 7/12  . Myocardial infarction [facility]) 2012 "several"  . On home oxygen therapy    "2 liters at night; negative for sleep apnea"  . PAD (peripheral artery disease) ([facility])    Severe; s/p bilateral renal artery stents, moderate in-stent restenosis  . Paroxysmal atrial fibrillation ([facility])    coumadin;  echo 9/07: EF 60%, mild LVH;  s/p Cox Maze 7/12 with LAA clipping  . Subclavian artery stenosis, left ([facility])    stented by Dr. Trula Slade on 10/18 to help flow of her LIMA to LAD  . Type II diabetes mellitus (H[facility])      Allergies as of 01/27/2018      Reactions   Isosorbide    UNSPECIFIED REACTION - okay in low doses   Codeine Itching   Contrast Media [iodinated Diagnostic Agents] Itching, Other (See Comments)   Itching of feet   Ioxaglate Itching, Other (See Comments)   Itching of feet   Metrizamide Itching, Other (See Comments)   Itching of feet   Percocet [oxycodone-acetaminophen] Itching, Other (See Comments)   Tolerates Hydrocodone      Medication List    TAKE these medications   albuterol 108 (90 Base) MCG/ACT inhaler Commonly known as:   PROVENTIL HFA;VENTOLIN HFA Inhale 2 puffs into the lungs every 4 (four) hours as needed for wheezing or shortness of breath. ProAir   allopurinol 100 MG tablet Commonly known as:  ZYLOPRIM Take 100 mg by mouth daily.   ALPRAZolam 0.5 MG tablet Commonly known as:  XANAX Take 0.25 mg by mouth at bedtime as needed for anxiety.   amLODipine 10 MG tablet Commonly known as:  NORVASC Take 10 mg by mouth daily.   atorvastatin 40 MG tablet Commonly known as:  LIPITOR Take 40 mg by mouth every evening.   carvedilol 25 MG tablet Commonly known as:  COREG TAKE 1 TABLET BY MOUTH 2 TIMES A DAY WITH A MEAL What changed:  See the new instructions.   cloNIDine 0.2 MG tablet Commonly known as:  CATAPRES Take 1 tablet (0.2 mg total) by mouth 3 (three) times daily.   clopidogrel 75 MG tablet Commonly known as:  PLAVIX Take 1 tablet (75 mg total) by mouth daily with breakfast.   DULoxetine 60 MG capsule Commonly known as:  CYMBALTA Take one capsule (60 mg dose) by mouth daily.   famotidine 20 MG tablet Commonly known as:  PEPCID TAKE 1 TABLET (20 MG TOTAL) BY MOUTH DAILY BEFORE SUPPER.   fenofibrate 160 MG tablet Take 1 tablet (160 mg total) by mouth daily.   ferrous sulfate 325 (65 FE) MG tablet Take 325 mg by mouth 2 (two) times daily with a meal.   Fish Oil 1000 MG Caps Take 1,000 mg by mouth 2 (two) times daily.   furosemide 40 MG tablet Commonly known as:  LASIX Take 40 mg by mouth daily. May take an additional tablet day as needed for weight gain   glipiZIDE 5 MG tablet Commonly known as:  GLUCOTROL Take one half tablet (2.5 mg dose) by mouth daily.   hydrALAZINE 100 MG tablet Commonly known as:  APRESOLINE Take 100 mg by mouth 3 (three) times daily.   HYDROcodone-acetaminophen 5-325 MG tablet Commonly known as:  NORCO/VICODIN Take 1 tablet by mouth every 8 (eight) hours as needed for moderate pain.   isosorbide mononitrate 30 MG 24 hr tablet Commonly known as:   IMDUR Take 0.5 tablets (15  mg total) by mouth daily.   magic mouthwash Soln Take 15 mLs by mouth 4 (four) times daily as needed for mouth pain.   pantoprazole 40 MG tablet Commonly known as:  PROTONIX Take 1 tablet (40 mg total) by mouth daily.   Potassium 99 MG Tabs Take 99 mg by mouth daily.   Vitamin D3 5000 units Tabs Take 5,000 Units by mouth daily.   zolpidem 10 MG tablet Commonly known as:  AMBIEN Take 10 mg by mouth at bedtime.       Discharge Instructions:   [facility]  Discharge Instructions Endovascular Aortic Aneurysm Repair  Please refer to the following instructions for your post-procedure care. Your surgeon or Physician Assistant will discuss any changes with you.  Activity  You are encouraged to walk as much as you can. You can slowly return to normal activities but must avoid strenuous activity and heavy lifting until your doctor tells you it's OK. Avoid activities such as vacuuming or swinging a gold club. It is normal to feel tired for several weeks after your surgery. Do not drive until your doctor gives the OK and you are no longer taking prescription pain medications. It is also normal to have difficulty with sleep habits, eating, and bowel movements after surgery. These will go away with time.  Bathing/Showering  You may shower after you go home. If you have an incision, do not soak in a bathtub, hot tub, or swim until the incision heals completely.  Incision Care  Shower every day. Clean your incision with mild soap and water. Pat the area dry with a clean towel. You do not need a bandage unless otherwise instructed. Do not apply any ointments or creams to your incision. If you clothing is irritating, you may cover your incision with a dry gauze pad.  Diet  Resume your normal diet. There are no special food restrictions following this procedure. A low fat/low cholesterol diet is recommended for all patients with  vascular disease. In order to heal from your surgery, it is CRITICAL to get adequate nutrition. Your body requires vitamins, minerals, and protein. Vegetables are the best source of vitamins and minerals. Vegetables also provide the perfect balance of protein. Processed food has little nutritional value, so try to avoid this.  Medications  Resume taking all of your medications unless your doctor or Physician Assistnat tells you not to. If your incision is causing pain, you may take over-the-counter pain relievers such as acetaminophen (Tylenol). If you were prescribed a stronger pain medication, please be aware these medications can cause nausea and constipation. Prevent nausea by taking the medication with a snack or meal. Avoid constipation by drinking plenty of fluids and eating foods with a high amount of fiber, such as fruits, vegetables, and grains. Do not take Tylenol if you are taking prescription pain medications.   Follow up  [place] office will schedule a follow-up appointment with a C.T. scan 3-4 weeks after your surgery.  Please call us immediately for any of the following conditions  Severe or worsening pain in your legs or feet or in your abdomen back or chest. Increased pain, redness, drainage (pus) from your incision sit. Increased abdominal pain, bloating, nausea, vomiting or persistent diarrhea. Fever of 101 degrees or higher. Swelling in your leg (s),  Reduce your risk of vascular disease  .Stop smoking. If you would like help call [facility] at 1-800-QUIT-NOW 309 804 2147) or [place] at 3374347282. .Manage your cholesterol .Maintain a  desired weight .Control your diabetes .Keep your blood pressure down  If you have questions, please call the office at [phone].   Disposition: home with [facility]  Patient's condition: is Fair  Follow up: 1. Dr. Trula Slade in 2-3 weeks with CTA protocol   Dagoberto Ligas, PA-C [facility] [phone] 01/30/2018  2:07 PM   - For VQI Registry use - Post-op:  Time to Extubation: [x]  In OR, [ ]  < 12 hrs, [ ]  12-24 hrs, [ ]  >=24 hrs Vasopressors Req. Post-op: No MI: No., [ ]  Troponin only, [ ]  EKG or Clinical New Arrhythmia: No CHF: No Transfusion: Yes  Complications: Resp failure: No., [ ]  Pneumonia, [ ]  Ventilator Chg in renal function: Yes.  , [x ] Inc. Cr > 0.5, [ ]  Temp. Dialysis,  [ ]  Permanent dialysis Leg ischemia: No., no Surgery needed, [ ]  Yes, Surgery needed,  [ ]  Amputation Bowel ischemia: No., [ ]  Medical Rx, [ ]  Surgical Rx Wound complication: No., [ ]  Superficial separation/infection, [ ]  Return to OR Return to OR: No  Return to OR for bleeding: No Stroke: No., [ ]  Minor, [ ]  Major  Discharge medications: Statin use:  Yes  ASA use:  Yes  Plavix use:  Yes  Beta blocker use:  Yes  ARB use:  No ACEI use:  No CCB use:  Yes

## 2018-02-02 ENCOUNTER — Other Ambulatory Visit: Payer: Self-pay

## 2018-02-02 ENCOUNTER — Telehealth: Payer: Self-pay | Admitting: Surgery

## 2018-02-02 DIAGNOSIS — I714 Abdominal aortic aneurysm, without rupture, unspecified: Secondary | ICD-10-CM

## 2018-02-02 DIAGNOSIS — N186 End stage renal disease: Secondary | ICD-10-CM

## 2018-02-02 DIAGNOSIS — Z992 Dependence on renal dialysis: Principal | ICD-10-CM

## 2018-02-02 NOTE — Telephone Encounter (Signed)
-----   Message from Penni Homans, RN sent at 01/27/2018  1:21 PM EDT ----- Regarding: Appointment   ----- Message ----- From: Iline Oven Sent: 01/27/2018   1:00 PM To: Vvs Charge Pool  Can you schedule an appt for this pt with Dr. Trula Slade only with CTA abd/pelvis in about 3-4 weeks.  PO EVAR with post op retro hematoma. Thanks, MAtt

## 2018-02-02 NOTE — Telephone Encounter (Signed)
sch appt spk to pt 03/06/18 9am. CT 1115am p/o MD s/p EVAR w/ Retro hematoma per stf msg

## 2018-02-14 ENCOUNTER — Telehealth: Payer: Self-pay

## 2018-02-14 NOTE — Telephone Encounter (Signed)
   [facility] Pre-operative Risk Assessment    Request for surgical clearance:  1. What type of surgery is being performed? Bilateral Cataract surgery   2. When is this surgery scheduled? TBD   3. What type of clearance is required (medical clearance vs. Pharmacy clearance to hold med vs. Both)? Medical  4. Are there any medications that need to be held prior to surgery and how long?   5. Practice name and name of physician performing surgery? [facility]/ Dr. Darleen Crocker   6. What is your office phone number [phone]   7.   What is your office fax number       [phone]  8.   Anesthesia type (None, local, MAC, general) ? Topical with IV sedation

## 2018-02-14 NOTE — Telephone Encounter (Signed)
   Primary Cardiologist: Dr. Burt Knack   Chart reviewed as part of pre-operative protocol coverage. Given past medical history and time since last visit, based on ACC/AHA guidelines, RAVONDA BRECHEEN would be at acceptable risk for the planned procedure without further cardiovascular testing.   Meds reviewed. No meds need to be held.   I will route this recommendation to the requesting party via Epic fax function and remove from pre-op pool.  Please call with questions.  Lyda Jester, PA-C 02/14/2018, 1:46 PM

## 2018-02-16 ENCOUNTER — Telehealth: Payer: Self-pay | Admitting: Cardiovascular Disease

## 2018-02-16 NOTE — Telephone Encounter (Signed)
Pt states she had a message to return a call to [place].  Gar Gibbon not in the office today and I would send message to her to contact pt once she returns to office as I am unsure what she called about.  Pt appreciative for call.

## 2018-02-16 NOTE — Telephone Encounter (Signed)
New message    Pt is calling back for nurse. She said that she got a call yesterday and is returing the call.

## 2018-02-17 NOTE — Telephone Encounter (Signed)
Scheduled patient for echo and OV with Dr. Burt Knack 10/28. She was grateful for call and agrees with treatment plan.

## 2018-02-20 ENCOUNTER — Other Ambulatory Visit: Payer: Self-pay | Admitting: Surgery

## 2018-02-20 ENCOUNTER — Ambulatory Visit: Payer: Self-pay | Admitting: Surgery

## 2018-02-20 ENCOUNTER — Other Ambulatory Visit: Payer: Self-pay

## 2018-02-20 ENCOUNTER — Encounter: Payer: Self-pay | Admitting: Surgery

## 2018-02-20 ENCOUNTER — Ambulatory Visit (HOSPITAL_COMMUNITY)
Admission: RE | Admit: 2018-02-20 | Discharge: 2018-02-20 | Disposition: A | Discharge status: Home / Self Care | Payer: Medicare Other | Source: Ambulatory Visit | Attending: Surgery | Admitting: Surgery

## 2018-02-20 VITALS — BP 165/70 | HR 63 | Temp 98.2°F | Resp 16 | Ht 65.0 in | Wt 127.0 lb

## 2018-02-20 DIAGNOSIS — Z4931 Encounter for adequacy testing for hemodialysis: Secondary | ICD-10-CM | POA: Insufficient documentation

## 2018-02-20 DIAGNOSIS — I714 Abdominal aortic aneurysm, without rupture, unspecified: Secondary | ICD-10-CM

## 2018-02-20 DIAGNOSIS — N186 End stage renal disease: Secondary | ICD-10-CM | POA: Insufficient documentation

## 2018-02-20 DIAGNOSIS — Z992 Dependence on renal dialysis: Secondary | ICD-10-CM | POA: Insufficient documentation

## 2018-02-20 MED ORDER — NITROGLYCERIN 0.1 MG/HR TD PT24: 0.2000 mg | MEDICATED_PATCH | Freq: Every day | TRANSDERMAL | Status: DC

## 2018-02-20 MED ORDER — NITROGLYCERIN 0.2 MG/HR TD PT24: 0.2000 mg | MEDICATED_PATCH | Freq: Every day | TRANSDERMAL | 12 refills | Status: DC

## 2018-02-20 NOTE — Progress Notes (Addendum)
Patient name: [name] MRN: [id number] DOB: 04-13-1951 Sex: female  REASON FOR VISIT:    Postop  HISTORY OF PRESENT ILLNESS:   [name] is a 67 y.o. female who returns today for follow-up.  I have been following her for her dialysis access as well as a abdominal aortic aneurysm.  She presented to the hospital with generalized abdominal pain and an increase in size of her aneurysm from 4.8-5.4 over several months.  I have been delaying her aneurysm repair because of her renal disease, however because of her presentation she ended up undergoing urgent endovascular aneurysm repair on 01/17/2018.  I only used a total of 7 cc of contrast.  She had a near occlusion of her left proximal common iliac artery which I deployed a VBX and side to make sure that it remained patent.  She developed a retroperitoneal hematoma postoperatively which kept in the hospital for several extra days.    She is here today stating that her appetite remains poor.  She is scheduled to see her nephrologist next week.  Her energy levels are also down.  CURRENT MEDICATIONS:    Current Outpatient Medications  Medication Sig Dispense Refill  . albuterol (PROVENTIL HFA;VENTOLIN HFA) 108 (90 Base) MCG/ACT inhaler Inhale 2 puffs into the lungs every 4 (four) hours as needed for wheezing or shortness of breath. ProAir 1 each 0  . allopurinol (ZYLOPRIM) 100 MG tablet Take 100 mg by mouth daily.  2  . ALPRAZolam (XANAX) 0.5 MG tablet Take 0.25 mg by mouth at bedtime as needed for anxiety.     Marland Kitchen amLODipine (NORVASC) 10 MG tablet Take 10 mg by mouth daily.      Marland Kitchen atorvastatin (LIPITOR) 40 MG tablet Take 40 mg by mouth every evening.     . carvedilol (COREG) 25 MG tablet TAKE 1 TABLET BY MOUTH 2 TIMES A DAY WITH A MEAL (Patient taking differently: TAKE 25 MG BY MOUTH 2 TIMES A DAY WITH A MEAL) 60 tablet 9  . Cholecalciferol (VITAMIN D3) 5000 units TABS Take 5,000 Units by mouth daily.    .  cloNIDine (CATAPRES) 0.2 MG tablet Take 1 tablet (0.2 mg total) by mouth 3 (three) times daily. 90 tablet 11  . clopidogrel (PLAVIX) 75 MG tablet Take 1 tablet (75 mg total) by mouth daily with breakfast. 90 tablet 3  . DULoxetine (CYMBALTA) 60 MG capsule Take one capsule (60 mg dose) by mouth daily.  1  . famotidine (PEPCID) 20 MG tablet TAKE 1 TABLET (20 MG TOTAL) BY MOUTH DAILY BEFORE SUPPER. 30 tablet 2  . fenofibrate 160 MG tablet Take 1 tablet (160 mg total) by mouth daily. 30 tablet 1  . ferrous sulfate 325 (65 FE) MG tablet Take 325 mg by mouth 2 (two) times daily with a meal.    . furosemide (LASIX) 40 MG tablet Take 40 mg by mouth daily. May take an additional tablet day as needed for weight gain   6  . glipiZIDE (GLUCOTROL) 5 MG tablet Take one half tablet (2.5 mg dose) by mouth daily.  23  . hydrALAZINE (APRESOLINE) 100 MG tablet Take 100 mg by mouth 3 (three) times daily.    Marland Kitchen HYDROcodone-acetaminophen (NORCO/VICODIN) 5-325 MG tablet Take 1 tablet by mouth every 8 (eight) hours as needed for moderate pain. 8 tablet 0  . isosorbide mononitrate (IMDUR) 30 MG 24 hr tablet Take 0.5 tablets (15 mg total) by mouth daily. 45 tablet 3  . magic mouthwash  SOLN Take 15 mLs by mouth 4 (four) times daily as needed for mouth pain.     . Omega-3 Fatty Acids (FISH OIL) 1000 MG CAPS Take 1,000 mg by mouth 2 (two) times daily.    . pantoprazole (PROTONIX) 40 MG tablet Take 1 tablet (40 mg total) by mouth daily. 30 tablet 11  . Potassium 99 MG TABS Take 99 mg by mouth daily.     Marland Kitchen zolpidem (AMBIEN) 10 MG tablet Take 10 mg by mouth at bedtime.    . mirtazapine (REMERON) 7.5 MG tablet Take 7.5 mg by mouth daily. Will start taking soon.     No current facility-administered medications for this visit.     REVIEW OF SYSTEMS:   [X]  denotes positive finding, [ ]  denotes negative finding Cardiac  Comments:  Chest pain or chest pressure:    Shortness of breath upon exertion: x   Short of breath when  lying flat: x   Irregular heart rhythm:    Constitutional    Fever or chills:      PHYSICAL EXAM:   Vitals:   02/20/18 1139  BP: (!) 165/70  Pulse: 63  Resp: 16  Temp: 98.2 F (36.8 C)  TempSrc: Oral  SpO2: 99%  Weight: 127 lb (57.6 kg)  Height: 5\' 5"  (1.651 m)    GENERAL: The patient is a well-nourished female, in no acute distress. The vital signs are documented above. CARDIOVASCULAR: There is a regular rate and rhythm. PULMONARY: Non-labored respirations Fistula is palpable but is somewhat faint  STUDIES:   Duplex of her fistula shows diameters ranging from 0.45-0.53 cm.  Depth range from 0.17 at the antecubital crease, two 0.36 in the mid upper arm to 0.92 in the upper arm.   MEDICAL ISSUES:   CKD 4: The patient is going to need a fistulogram as there appears to be narrowing near the antecubital crease.  The patient is reluctant given the need for additional contrast.  She is scheduled to see nephrology next week.  I have her coming back to see me in mid July and we will address this at that time.  Again the next step would be a fistulogram  AAA: The patient should get a CT scan when she returns to see me in 3-4 weeks.  The patient does have some bluish discoloration of the lateral toes in the left foot potentially representing an embolic process from her aneurysm repair.  I am going to attempt nitroglycerin patches to see if this helps improve the appearance of her toes.  [name], MD [facility] [phone] Pager [phone]

## 2018-02-20 NOTE — Addendum Note (Signed)
Addended by: Serafina Mitchell on: 02/20/2018 04:43 PM   Modules accepted: Orders

## 2018-03-06 ENCOUNTER — Encounter: Payer: Self-pay | Admitting: *Deleted

## 2018-03-06 ENCOUNTER — Ambulatory Visit (INDEPENDENT_AMBULATORY_CARE_PROVIDER_SITE_OTHER): Payer: Self-pay | Admitting: Surgery

## 2018-03-06 ENCOUNTER — Other Ambulatory Visit: Payer: Self-pay

## 2018-03-06 ENCOUNTER — Encounter: Payer: Self-pay | Admitting: Surgery

## 2018-03-06 ENCOUNTER — Ambulatory Visit
Admission: RE | Admit: 2018-03-06 | Discharge: 2018-03-06 | Disposition: A | Discharge status: Home / Self Care | Payer: Medicare Other | Source: Ambulatory Visit | Attending: Surgery | Admitting: Surgery

## 2018-03-06 VITALS — BP 174/71 | HR 68 | Temp 97.9°F | Resp 16 | Ht 65.0 in | Wt 129.0 lb

## 2018-03-06 DIAGNOSIS — I714 Abdominal aortic aneurysm, without rupture, unspecified: Secondary | ICD-10-CM

## 2018-03-06 DIAGNOSIS — N184 Chronic kidney disease, stage 4 (severe): Secondary | ICD-10-CM

## 2018-03-06 MED ORDER — GABAPENTIN 300 MG PO CAPS: 300.0000 mg | ORAL_CAPSULE | Freq: Two times a day (BID) | ORAL | 0 refills | Status: DC

## 2018-03-06 NOTE — H&P (View-Only) (Signed)
Patient name: [name] MRN: [id number] DOB: June 26, 1951 Sex: female  REASON FOR VISIT:     post op  HISTORY OF PRESENT ILLNESS:   [name] is a 67 y.o. female who I have been following for both chronic renal insufficiency as well as an abdominal aortic aneurysm.  We were trying to delay the treatment of her aneurysm until her renal disease progressed to dialysis.  She presented to the hospital in May 2019 with worsening abdominal pain and an increase in the size of her aneurysm.  Her aneurysm had increased from 4.8 cm up to 5.4 cm.  On 01/17/2018 she underwent endovascular repair.  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  In order to cross this lesion I had to snare it from the contralateral right side.  A VBX stent was placed across the lesion inside the aortic graft due to residual narrowing.  Patient's postoperative course was complicated by retroperitoneal hematoma likely the result from arterial injury when dilating her left common iliac artery.  A total of 7 cc of contrast were utilized for her repair.  The patient stayed in the hospital almost an additional week.  She has been wearing nitroglycerin patches to her left foot because of some discoloration not to be embolization from her surgery.  She still gets some pins and needle pain.  The discoloration is less  CURRENT MEDICATIONS:    Current Outpatient Medications  Medication Sig Dispense Refill  . albuterol (PROVENTIL HFA;VENTOLIN HFA) 108 (90 Base) MCG/ACT inhaler Inhale 2 puffs into the lungs every 4 (four) hours as needed for wheezing or shortness of breath. ProAir 1 each 0  . allopurinol (ZYLOPRIM) 100 MG tablet Take 100 mg by mouth daily.  2  . ALPRAZolam (XANAX) 0.5 MG tablet Take 0.25 mg by mouth at bedtime as needed for anxiety.     Marland Kitchen amLODipine (NORVASC) 10 MG tablet Take 10 mg by mouth daily.      Marland Kitchen atorvastatin (LIPITOR) 40 MG tablet Take 40 mg by mouth  every evening.     . carvedilol (COREG) 25 MG tablet TAKE 1 TABLET BY MOUTH 2 TIMES A DAY WITH A MEAL (Patient taking differently: TAKE 25 MG BY MOUTH 2 TIMES A DAY WITH A MEAL) 60 tablet 9  . Cholecalciferol (VITAMIN D3) 5000 units TABS Take 5,000 Units by mouth daily.    . cloNIDine (CATAPRES) 0.2 MG tablet Take 1 tablet (0.2 mg total) by mouth 3 (three) times daily. 90 tablet 11  . clopidogrel (PLAVIX) 75 MG tablet Take 1 tablet (75 mg total) by mouth daily with breakfast. 90 tablet 3  . DULoxetine (CYMBALTA) 60 MG capsule Take one capsule (60 mg dose) by mouth daily.  1  . famotidine (PEPCID) 20 MG tablet TAKE 1 TABLET (20 MG TOTAL) BY MOUTH DAILY BEFORE SUPPER. 30 tablet 2  . fenofibrate 160 MG tablet Take 1 tablet (160 mg total) by mouth daily. 30 tablet 1  . ferrous sulfate 325 (65 FE) MG tablet Take 325 mg by mouth 2 (two) times daily with a meal.    . furosemide (LASIX) 40 MG tablet Take 40 mg by mouth daily. May take an additional tablet day as needed for weight gain   6  . glipiZIDE (GLUCOTROL) 5 MG tablet Take one half tablet (2.5 mg dose) by mouth daily.  23  . hydrALAZINE (APRESOLINE) 100 MG tablet Take 100 mg by mouth 3 (three) times daily.    Marland Kitchen  HYDROcodone-acetaminophen (NORCO/VICODIN) 5-325 MG tablet Take 1 tablet by mouth every 8 (eight) hours as needed for moderate pain. 8 tablet 0  . isosorbide mononitrate (IMDUR) 30 MG 24 hr tablet Take 0.5 tablets (15 mg total) by mouth daily. 45 tablet 3  . magic mouthwash SOLN Take 15 mLs by mouth 4 (four) times daily as needed for mouth pain.     . mirtazapine (REMERON) 7.5 MG tablet Take 7.5 mg by mouth daily. Will start taking soon.    . nitroGLYCERIN (NITRO-DUR) 0.2 mg/hr patch Place 1 patch (0.2 mg total) onto the skin daily. Remove old patch before applying new patch. Apply around the same time every day. 30 patch 12  . Omega-3 Fatty Acids (FISH OIL) 1000 MG CAPS Take 1,000 mg by mouth 2 (two) times daily.    . pantoprazole (PROTONIX)  40 MG tablet Take 1 tablet (40 mg total) by mouth daily. 30 tablet 11  . Potassium 99 MG TABS Take 99 mg by mouth daily.     Marland Kitchen zolpidem (AMBIEN) 10 MG tablet Take 10 mg by mouth at bedtime.     No current facility-administered medications for this visit.     REVIEW OF SYSTEMS:   [X]  denotes positive finding, [ ]  denotes negative finding Cardiac  Comments:  Chest pain or chest pressure:    Shortness of breath upon exertion:    Short of breath when lying flat:    Irregular heart rhythm:    Constitutional    Fever or chills:      PHYSICAL EXAM:   Vitals:   03/06/18 1116  BP: (!) 174/71  Pulse: 68  Resp: 16  Temp: 97.9 F (36.6 C)  TempSrc: Oral  SpO2: 98%  Weight: 129 lb (58.5 kg)  Height: 5\' 5"  (1.651 m)    GENERAL: The patient is a well-nourished female, in no acute distress. The vital signs are documented above. CARDIOVASCULAR: There is a regular rate and rhythm. PULMONARY: Non-labored respirations  Brisk Doppler signals in bilateral pedal vessels.   STUDIES:   CT SCAN:   Status post endograft repair of infrarenal abdominal aortic aneurysm. Due to lack of intravenous contrast, endoleaks cannot be excluded on the basis of this exam. Aneurysmal sac is stable at 4.7 cm in diameter compared to prior exam.  Minimal cholelithiasis is noted without inflammation.  Bilateral nonobstructive nephrolithiasis is noted. No hydronephrosis or renal obstruction is noted.  Left retroperitoneal seroma is noted which has evolved from left retroperitoneal hematoma noted on prior exam. It is significantly smaller compared to prior exam.   FISTULA DUPLEX:  +------------+---------+-------------+---------+-------------------------------+ OUTFLOW VEIN  PSV  Diameter (cm) Depth       Describe              (cm/s)         (cm)                    +------------+---------+-------------+---------+-------------------------------+ Shoulder    174    0.46    0.92                   +------------+---------+-------------+---------+-------------------------------+ Prox UA     125    0.51    0.91                   +------------+---------+-------------+---------+-------------------------------+ Mid UA     56    0.53    0.36                   +------------+---------+-------------+---------+-------------------------------+  Dist UA     75    0.52    0.31      competing branch     +------------+---------+-------------+---------+-------------------------------+ AC Fossa    650    0.45    0.17       stenotic and                                partially-occlusive    +------------+---------+-------------+---------+-------------------------------+ 0.20 cm residual diameter of the cephalic outflow vein at the antecubital fossa. MEDICAL ISSUES:   AAA: Patient will get repeat ultrasound surveillance in 6 months.  CKD: Patient will be scheduled for fistulogram on July 16.  I will use minimal contrast.  It appears that the vein is a problem near the arterial venous anastomosis, and therefore I will need to either access the radial artery or stick in the proximal upper arm.  Toe pain: I will try a prescription of Neurontin to see if this helps alleviate some of her symptoms  [name], MD [facility] [phone] Pager [phone]

## 2018-03-06 NOTE — Progress Notes (Signed)
Patient name: [name] MRN: [id number] DOB: 1951-04-06 Sex: female  REASON FOR VISIT:     post op  HISTORY OF PRESENT ILLNESS:   [name] is a 67 y.o. female who I have been following for both chronic renal insufficiency as well as an abdominal aortic aneurysm.  We were trying to delay the treatment of her aneurysm until her renal disease progressed to dialysis.  She presented to the hospital in May 2019 with worsening abdominal pain and an increase in the size of her aneurysm.  Her aneurysm had increased from 4.8 cm up to 5.4 cm.  On 01/17/2018 she underwent endovascular repair.  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  In order to cross this lesion I had to snare it from the contralateral right side.  A VBX stent was placed across the lesion inside the aortic graft due to residual narrowing.  Patient's postoperative course was complicated by retroperitoneal hematoma likely the result from arterial injury when dilating her left common iliac artery.  A total of 7 cc of contrast were utilized for her repair.  The patient stayed in the hospital almost an additional week.  She has been wearing nitroglycerin patches to her left foot because of some discoloration not to be embolization from her surgery.  She still gets some pins and needle pain.  The discoloration is less  CURRENT MEDICATIONS:    Current Outpatient Medications  Medication Sig Dispense Refill  . albuterol (PROVENTIL HFA;VENTOLIN HFA) 108 (90 Base) MCG/ACT inhaler Inhale 2 puffs into the lungs every 4 (four) hours as needed for wheezing or shortness of breath. ProAir 1 each 0  . allopurinol (ZYLOPRIM) 100 MG tablet Take 100 mg by mouth daily.  2  . ALPRAZolam (XANAX) 0.5 MG tablet Take 0.25 mg by mouth at bedtime as needed for anxiety.     Marland Kitchen amLODipine (NORVASC) 10 MG tablet Take 10 mg by mouth daily.      Marland Kitchen atorvastatin (LIPITOR) 40 MG tablet Take 40 mg by mouth  every evening.     . carvedilol (COREG) 25 MG tablet TAKE 1 TABLET BY MOUTH 2 TIMES A DAY WITH A MEAL (Patient taking differently: TAKE 25 MG BY MOUTH 2 TIMES A DAY WITH A MEAL) 60 tablet 9  . Cholecalciferol (VITAMIN D3) 5000 units TABS Take 5,000 Units by mouth daily.    . cloNIDine (CATAPRES) 0.2 MG tablet Take 1 tablet (0.2 mg total) by mouth 3 (three) times daily. 90 tablet 11  . clopidogrel (PLAVIX) 75 MG tablet Take 1 tablet (75 mg total) by mouth daily with breakfast. 90 tablet 3  . DULoxetine (CYMBALTA) 60 MG capsule Take one capsule (60 mg dose) by mouth daily.  1  . famotidine (PEPCID) 20 MG tablet TAKE 1 TABLET (20 MG TOTAL) BY MOUTH DAILY BEFORE SUPPER. 30 tablet 2  . fenofibrate 160 MG tablet Take 1 tablet (160 mg total) by mouth daily. 30 tablet 1  . ferrous sulfate 325 (65 FE) MG tablet Take 325 mg by mouth 2 (two) times daily with a meal.    . furosemide (LASIX) 40 MG tablet Take 40 mg by mouth daily. May take an additional tablet day as needed for weight gain   6  . glipiZIDE (GLUCOTROL) 5 MG tablet Take one half tablet (2.5 mg dose) by mouth daily.  23  . hydrALAZINE (APRESOLINE) 100 MG tablet Take 100 mg by mouth 3 (three) times daily.    Marland Kitchen  HYDROcodone-acetaminophen (NORCO/VICODIN) 5-325 MG tablet Take 1 tablet by mouth every 8 (eight) hours as needed for moderate pain. 8 tablet 0  . isosorbide mononitrate (IMDUR) 30 MG 24 hr tablet Take 0.5 tablets (15 mg total) by mouth daily. 45 tablet 3  . magic mouthwash SOLN Take 15 mLs by mouth 4 (four) times daily as needed for mouth pain.     . mirtazapine (REMERON) 7.5 MG tablet Take 7.5 mg by mouth daily. Will start taking soon.    . nitroGLYCERIN (NITRO-DUR) 0.2 mg/hr patch Place 1 patch (0.2 mg total) onto the skin daily. Remove old patch before applying new patch. Apply around the same time every day. 30 patch 12  . Omega-3 Fatty Acids (FISH OIL) 1000 MG CAPS Take 1,000 mg by mouth 2 (two) times daily.    . pantoprazole (PROTONIX)  40 MG tablet Take 1 tablet (40 mg total) by mouth daily. 30 tablet 11  . Potassium 99 MG TABS Take 99 mg by mouth daily.     Marland Kitchen zolpidem (AMBIEN) 10 MG tablet Take 10 mg by mouth at bedtime.     No current facility-administered medications for this visit.     REVIEW OF SYSTEMS:   [X]  denotes positive finding, [ ]  denotes negative finding Cardiac  Comments:  Chest pain or chest pressure:    Shortness of breath upon exertion:    Short of breath when lying flat:    Irregular heart rhythm:    Constitutional    Fever or chills:      PHYSICAL EXAM:   Vitals:   03/06/18 1116  BP: (!) 174/71  Pulse: 68  Resp: 16  Temp: 97.9 F (36.6 C)  TempSrc: Oral  SpO2: 98%  Weight: 129 lb (58.5 kg)  Height: 5\' 5"  (1.651 m)    GENERAL: The patient is a well-nourished female, in no acute distress. The vital signs are documented above. CARDIOVASCULAR: There is a regular rate and rhythm. PULMONARY: Non-labored respirations  Brisk Doppler signals in bilateral pedal vessels.   STUDIES:   CT SCAN:   Status post endograft repair of infrarenal abdominal aortic aneurysm. Due to lack of intravenous contrast, endoleaks cannot be excluded on the basis of this exam. Aneurysmal sac is stable at 4.7 cm in diameter compared to prior exam.  Minimal cholelithiasis is noted without inflammation.  Bilateral nonobstructive nephrolithiasis is noted. No hydronephrosis or renal obstruction is noted.  Left retroperitoneal seroma is noted which has evolved from left retroperitoneal hematoma noted on prior exam. It is significantly smaller compared to prior exam.   FISTULA DUPLEX:  +------------+---------+-------------+---------+-------------------------------+ OUTFLOW VEIN  PSV  Diameter (cm) Depth       Describe              (cm/s)         (cm)                    +------------+---------+-------------+---------+-------------------------------+ Shoulder    174    0.46    0.92                   +------------+---------+-------------+---------+-------------------------------+ Prox UA     125    0.51    0.91                   +------------+---------+-------------+---------+-------------------------------+ Mid UA     56    0.53    0.36                   +------------+---------+-------------+---------+-------------------------------+  Dist UA     75    0.52    0.31      competing branch     +------------+---------+-------------+---------+-------------------------------+ AC Fossa    650    0.45    0.17       stenotic and                                partially-occlusive    +------------+---------+-------------+---------+-------------------------------+ 0.20 cm residual diameter of the cephalic outflow vein at the antecubital fossa. MEDICAL ISSUES:   AAA: Patient will get repeat ultrasound surveillance in 6 months.  CKD: Patient will be scheduled for fistulogram on July 16.  I will use minimal contrast.  It appears that the vein is a problem near the arterial venous anastomosis, and therefore I will need to either access the radial artery or stick in the proximal upper arm.  Toe pain: I will try a prescription of Neurontin to see if this helps alleviate some of her symptoms  [name], MD [facility] [phone] Pager [phone]

## 2018-03-13 ENCOUNTER — Other Ambulatory Visit: Payer: Self-pay

## 2018-03-13 DIAGNOSIS — I714 Abdominal aortic aneurysm, without rupture, unspecified: Secondary | ICD-10-CM

## 2018-03-21 ENCOUNTER — Telehealth: Payer: Self-pay | Admitting: Surgery

## 2018-03-21 ENCOUNTER — Encounter (HOSPITAL_COMMUNITY): Payer: Self-pay | Admitting: Surgery

## 2018-03-21 ENCOUNTER — Ambulatory Visit (HOSPITAL_COMMUNITY)
Admission: RE | Admit: 2018-03-21 | Discharge: 2018-03-21 | Disposition: A | Discharge status: Home / Self Care | Payer: Medicare Other | Source: Ambulatory Visit | Attending: Surgery | Admitting: Surgery

## 2018-03-21 ENCOUNTER — Ambulatory Visit (HOSPITAL_COMMUNITY)
Admission: RE | Disposition: A | Discharge status: Home / Self Care | Payer: Self-pay | Source: Ambulatory Visit | Attending: Surgery

## 2018-03-21 DIAGNOSIS — T82898A Other specified complication of vascular prosthetic devices, implants and grafts, initial encounter: Secondary | ICD-10-CM | POA: Insufficient documentation

## 2018-03-21 DIAGNOSIS — I97638 Postprocedural hematoma of a circulatory system organ or structure following other circulatory system procedure: Secondary | ICD-10-CM | POA: Insufficient documentation

## 2018-03-21 DIAGNOSIS — N186 End stage renal disease: Secondary | ICD-10-CM | POA: Diagnosis not present

## 2018-03-21 DIAGNOSIS — Y783 Surgical instruments, materials and radiological devices (including sutures) associated with adverse incidents: Secondary | ICD-10-CM | POA: Insufficient documentation

## 2018-03-21 DIAGNOSIS — Y832 Surgical operation with anastomosis, bypass or graft as the cause of abnormal reaction of the patient, or of later complication, without mention of misadventure at the time of the procedure: Secondary | ICD-10-CM | POA: Diagnosis not present

## 2018-03-21 DIAGNOSIS — Z7902 Long term (current) use of antithrombotics/antiplatelets: Secondary | ICD-10-CM | POA: Diagnosis not present

## 2018-03-21 DIAGNOSIS — I871 Compression of vein: Secondary | ICD-10-CM | POA: Insufficient documentation

## 2018-03-21 DIAGNOSIS — Z992 Dependence on renal dialysis: Secondary | ICD-10-CM

## 2018-03-21 DIAGNOSIS — T82858A Stenosis of vascular prosthetic devices, implants and grafts, initial encounter: Secondary | ICD-10-CM | POA: Diagnosis present

## 2018-03-21 HISTORY — PX: PERIPHERAL VASCULAR BALLOON ANGIOPLASTY: CATH118281

## 2018-03-21 HISTORY — PX: A/V FISTULAGRAM: CATH118298

## 2018-03-21 LAB — POCT I-STAT, CHEM 8
BUN: 24 mg/dL — AB (ref 8–23)
CHLORIDE: 101 mmol/L (ref 98–111)
CREATININE: 3.1 mg/dL — AB (ref 0.44–1.00)
Calcium, Ion: 1.1 mmol/L — ABNORMAL LOW (ref 1.15–1.40)
Glucose, Bld: 119 mg/dL — ABNORMAL HIGH (ref 70–99)
HEMATOCRIT: 25 % — AB (ref 36.0–46.0)
HEMOGLOBIN: 8.5 g/dL — AB (ref 12.0–15.0)
POTASSIUM: 3.9 mmol/L (ref 3.5–5.1)
Sodium: 140 mmol/L (ref 135–145)
TCO2: 30 mmol/L (ref 22–32)

## 2018-03-21 LAB — GLUCOSE, CAPILLARY
Glucose-Capillary: 113 mg/dL — ABNORMAL HIGH (ref 70–99)
Glucose-Capillary: 160 mg/dL — ABNORMAL HIGH (ref 70–99)

## 2018-03-21 SURGERY — A/V FISTULAGRAM
Anesthesia: LOCAL | Laterality: Left

## 2018-03-21 MED ORDER — HEPARIN SODIUM (PORCINE) 1000 UNIT/ML IJ SOLN
INTRAMUSCULAR | Status: AC
  Filled 2018-03-21: qty 1

## 2018-03-21 MED ORDER — MIDAZOLAM HCL 2 MG/2ML IJ SOLN
INTRAMUSCULAR | Status: AC
  Filled 2018-03-21: qty 2

## 2018-03-21 MED ORDER — HEPARIN SODIUM (PORCINE) 1000 UNIT/ML IJ SOLN
INTRAMUSCULAR | Status: DC | PRN
  Administered 2018-03-21: 3000 [IU] via INTRAVENOUS

## 2018-03-21 MED ORDER — MIDAZOLAM HCL 2 MG/2ML IJ SOLN
INTRAMUSCULAR | Status: DC | PRN
  Administered 2018-03-21 (×2): 1 mg via INTRAVENOUS

## 2018-03-21 MED ORDER — IODIXANOL 320 MG/ML IV SOLN
INTRAVENOUS | Status: DC | PRN
  Administered 2018-03-21: 7 mL

## 2018-03-21 MED ORDER — FAMOTIDINE IN NACL 20-0.9 MG/50ML-% IV SOLN
20.0000 mg | INTRAVENOUS | Status: AC
  Administered 2018-03-21: 20 mg via INTRAVENOUS

## 2018-03-21 MED ORDER — DIPHENHYDRAMINE HCL 50 MG/ML IJ SOLN
25.0000 mg | INTRAMUSCULAR | Status: AC
  Administered 2018-03-21: 25 mg via INTRAVENOUS

## 2018-03-21 MED ORDER — METHYLPREDNISOLONE SODIUM SUCC 125 MG IJ SOLR
INTRAMUSCULAR | Status: AC
  Administered 2018-03-21: 125 mg via INTRAVENOUS
  Filled 2018-03-21: qty 2

## 2018-03-21 MED ORDER — HEPARIN (PORCINE) IN NACL 1000-0.9 UT/500ML-% IV SOLN
INTRAVENOUS | Status: DC | PRN
  Administered 2018-03-21: 500 mL

## 2018-03-21 MED ORDER — LIDOCAINE HCL (PF) 1 % IJ SOLN
INTRAMUSCULAR | Status: AC
  Filled 2018-03-21: qty 30

## 2018-03-21 MED ORDER — FENTANYL CITRATE (PF) 100 MCG/2ML IJ SOLN
INTRAMUSCULAR | Status: AC
  Filled 2018-03-21: qty 2

## 2018-03-21 MED ORDER — METHYLPREDNISOLONE SODIUM SUCC 125 MG IJ SOLR
125.0000 mg | INTRAMUSCULAR | Status: AC
  Administered 2018-03-21: 125 mg via INTRAVENOUS

## 2018-03-21 MED ORDER — SODIUM CHLORIDE 0.9 % IV SOLN
INTRAVENOUS | Status: DC
  Administered 2018-03-21: 07:00:00 via INTRAVENOUS

## 2018-03-21 MED ORDER — HEPARIN (PORCINE) IN NACL 1000-0.9 UT/500ML-% IV SOLN
INTRAVENOUS | Status: AC
  Filled 2018-03-21: qty 1000

## 2018-03-21 MED ORDER — FENTANYL CITRATE (PF) 100 MCG/2ML IJ SOLN
INTRAMUSCULAR | Status: DC | PRN
  Administered 2018-03-21 (×2): 25 ug via INTRAVENOUS

## 2018-03-21 MED ORDER — DIPHENHYDRAMINE HCL 50 MG/ML IJ SOLN
INTRAMUSCULAR | Status: AC
  Administered 2018-03-21: 25 mg via INTRAVENOUS
  Filled 2018-03-21: qty 1

## 2018-03-21 MED ORDER — FAMOTIDINE IN NACL 20-0.9 MG/50ML-% IV SOLN
INTRAVENOUS | Status: AC
  Administered 2018-03-21: 20 mg via INTRAVENOUS
  Filled 2018-03-21: qty 50

## 2018-03-21 SURGICAL SUPPLY — 15 items
BAG SNAP BAND KOVER 36X36 (MISCELLANEOUS) ×2 IMPLANT
BALLN STERLING OTW 5X40X135 (BALLOONS) ×2
BALLOON STERLING OTW 5X40X135 (BALLOONS) ×1 IMPLANT
COVER DOME SNAP 22 D (MISCELLANEOUS) ×2 IMPLANT
COVER PRB 48X5XTLSCP FOLD TPE (BAG) ×1 IMPLANT
COVER PROBE 5X48 (BAG) ×1
KIT ENCORE 26 ADVANTAGE (KITS) ×2 IMPLANT
KIT MICROPUNCTURE NIT STIFF (SHEATH) ×2 IMPLANT
PROTECTION STATION PRESSURIZED (MISCELLANEOUS) ×2
SHEATH PINNACLE R/O II 5F 6CM (SHEATH) ×2 IMPLANT
STATION PROTECTION PRESSURIZED (MISCELLANEOUS) ×1 IMPLANT
STOPCOCK MORSE 400PSI 3WAY (MISCELLANEOUS) ×2 IMPLANT
TRAY PV CATH (CUSTOM PROCEDURE TRAY) ×2 IMPLANT
TUBING CIL FLEX 10 FLL-RA (TUBING) ×2 IMPLANT
WIRE SPARTACORE .014X300CM (WIRE) ×2 IMPLANT

## 2018-03-21 NOTE — Telephone Encounter (Signed)
sch appt lvm 04/05/18 1pm Dialysis Duplex 2pm f/u PA

## 2018-03-21 NOTE — Discharge Instructions (Signed)

## 2018-03-21 NOTE — Interval H&P Note (Signed)
History and Physical Interval Note:  03/21/2018 6:54 AM  [name]  has presented today for surgery, with the diagnosis of ESRD  The various methods of treatment have been discussed with the patient and family. After consideration of risks, benefits and other options for treatment, the patient has consented to  Procedure(s): A/V FISTULAGRAM (Left) as a surgical intervention .  The patient's history has been reviewed, patient examined, no change in status, stable for surgery.  I have reviewed the patient's chart and labs.  Questions were answered to the patient's satisfaction.     [name]

## 2018-03-21 NOTE — Op Note (Signed)
    Patient name: [name] MRN: [id number] DOB: 10/16/1950 Sex: female  03/21/2018 Pre-operative Diagnosis: non maturing left BCT Post-operative diagnosis:  Same Surgeon:  [name] Procedure Performed:  1.  U/s guided access, Left BCF  2.  Fistulogram  3.  Angioplasty, cephalic vein  4.  Conscious sedation (25 minutes)   Indications:  Non maturing left brachiocephalic fistula  Procedure:  The patient was identified in the holding area and taken to room 8.  The patient was then placed supine on the table and prepped and draped in the usual sterile fashion.  A time out was called.  Conscious sedation was administered with the use of IV fentanyl Versed under continuous physician and nurse monitoring.  Heart rate, blood pressure, and oxygen saturation were continuously monitored.  Ultrasound was used to evaluate the fistula.  The vein was patent and compressible.  A digital ultrasound image was acquired.  The fistula was then accessed under ultrasound guidance using a micropuncture needle.  An 018 wire was then asvanced without resistance and a micropuncture sheath was placed.  Contrast injections were then performed through the sheath.  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  Over a 035 wire, a 5 French sheath was inserted.  3000 units of heparin was then administered.  I used a 014 [place] core wire to gain access across the arterial venous anastomosis.  I selected a 5 x 40 Sterling balloon and performed balloon angioplasty of the proximal cephalic vein taking the balloon to 9 atm for 1 minute.  Completion imaging revealed resolution of the stenosis.  The splint wires were removed.  A pursestring suture was used for closure however the patient did develop a hematoma.  Manual pressure was held for 20 minutes.  There was an  excellent thrill within the fistula.  A total of 7 cc of contrast were utilized.  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    Theotis Burrow, M.D. [facility] [place] Office: [phone] Pager:  [phone]

## 2018-03-22 ENCOUNTER — Other Ambulatory Visit: Payer: Self-pay

## 2018-03-22 DIAGNOSIS — Z992 Dependence on renal dialysis: Principal | ICD-10-CM

## 2018-03-22 DIAGNOSIS — Z48812 Encounter for surgical aftercare following surgery on the circulatory system: Secondary | ICD-10-CM

## 2018-03-22 DIAGNOSIS — N186 End stage renal disease: Secondary | ICD-10-CM

## 2018-03-22 MED FILL — Heparin Sod (Porcine)-NaCl IV Soln 1000 Unit/500ML-0.9%: INTRAVENOUS | Qty: 500 | Status: AC

## 2018-03-22 MED FILL — Lidocaine HCl Local Preservative Free (PF) Inj 1%: INTRAMUSCULAR | Qty: 2 | Status: AC

## 2018-03-27 HISTORY — PX: CATARACT EXTRACTION: SUR2

## 2018-04-05 ENCOUNTER — Encounter (HOSPITAL_COMMUNITY): Payer: Self-pay

## 2018-04-05 ENCOUNTER — Ambulatory Visit: Payer: Medicare Other

## 2018-04-06 ENCOUNTER — Other Ambulatory Visit: Payer: Self-pay | Admitting: Surgery

## 2018-04-09 ENCOUNTER — Emergency Department (HOSPITAL_COMMUNITY): Payer: Medicare Other

## 2018-04-09 ENCOUNTER — Encounter (HOSPITAL_COMMUNITY): Payer: Self-pay | Admitting: Emergency Medicine

## 2018-04-09 ENCOUNTER — Inpatient Hospital Stay (HOSPITAL_COMMUNITY)
Admission: EM | Admit: 2018-04-09 | Discharge: 2018-04-21 | DRG: 987 | Disposition: A | Discharge status: Home Health | Payer: Medicare Other | Attending: Internal Medicine | Admitting: Internal Medicine

## 2018-04-09 DIAGNOSIS — D62 Acute posthemorrhagic anemia: Secondary | ICD-10-CM | POA: Diagnosis present

## 2018-04-09 DIAGNOSIS — Z833 Family history of diabetes mellitus: Secondary | ICD-10-CM

## 2018-04-09 DIAGNOSIS — I252 Old myocardial infarction: Secondary | ICD-10-CM

## 2018-04-09 DIAGNOSIS — I714 Abdominal aortic aneurysm, without rupture, unspecified: Secondary | ICD-10-CM | POA: Diagnosis present

## 2018-04-09 DIAGNOSIS — F17210 Nicotine dependence, cigarettes, uncomplicated: Secondary | ICD-10-CM | POA: Diagnosis present

## 2018-04-09 DIAGNOSIS — D649 Anemia, unspecified: Secondary | ICD-10-CM

## 2018-04-09 DIAGNOSIS — E785 Hyperlipidemia, unspecified: Secondary | ICD-10-CM | POA: Diagnosis present

## 2018-04-09 DIAGNOSIS — K859 Acute pancreatitis without necrosis or infection, unspecified: Secondary | ICD-10-CM

## 2018-04-09 DIAGNOSIS — G8929 Other chronic pain: Secondary | ICD-10-CM | POA: Diagnosis present

## 2018-04-09 DIAGNOSIS — Z955 Presence of coronary angioplasty implant and graft: Secondary | ICD-10-CM

## 2018-04-09 DIAGNOSIS — S161XXA Strain of muscle, fascia and tendon at neck level, initial encounter: Secondary | ICD-10-CM

## 2018-04-09 DIAGNOSIS — K810 Acute cholecystitis: Secondary | ICD-10-CM | POA: Clinically undetermined

## 2018-04-09 DIAGNOSIS — I5033 Acute on chronic diastolic (congestive) heart failure: Secondary | ICD-10-CM | POA: Diagnosis not present

## 2018-04-09 DIAGNOSIS — R531 Weakness: Secondary | ICD-10-CM | POA: Diagnosis present

## 2018-04-09 DIAGNOSIS — E1151 Type 2 diabetes mellitus with diabetic peripheral angiopathy without gangrene: Secondary | ICD-10-CM | POA: Diagnosis present

## 2018-04-09 DIAGNOSIS — J449 Chronic obstructive pulmonary disease, unspecified: Secondary | ICD-10-CM | POA: Diagnosis present

## 2018-04-09 DIAGNOSIS — Z952 Presence of prosthetic heart valve: Secondary | ICD-10-CM

## 2018-04-09 DIAGNOSIS — F17200 Nicotine dependence, unspecified, uncomplicated: Secondary | ICD-10-CM | POA: Diagnosis present

## 2018-04-09 DIAGNOSIS — K8000 Calculus of gallbladder with acute cholecystitis without obstruction: Secondary | ICD-10-CM | POA: Diagnosis present

## 2018-04-09 DIAGNOSIS — D509 Iron deficiency anemia, unspecified: Secondary | ICD-10-CM | POA: Diagnosis not present

## 2018-04-09 DIAGNOSIS — Z9889 Other specified postprocedural states: Secondary | ICD-10-CM

## 2018-04-09 DIAGNOSIS — Z9981 Dependence on supplemental oxygen: Secondary | ICD-10-CM

## 2018-04-09 DIAGNOSIS — J811 Chronic pulmonary edema: Secondary | ICD-10-CM

## 2018-04-09 DIAGNOSIS — K851 Biliary acute pancreatitis without necrosis or infection: Secondary | ICD-10-CM | POA: Diagnosis not present

## 2018-04-09 DIAGNOSIS — E119 Type 2 diabetes mellitus without complications: Secondary | ICD-10-CM

## 2018-04-09 DIAGNOSIS — Z8349 Family history of other endocrine, nutritional and metabolic diseases: Secondary | ICD-10-CM

## 2018-04-09 DIAGNOSIS — I132 Hypertensive heart and chronic kidney disease with heart failure and with stage 5 chronic kidney disease, or end stage renal disease: Secondary | ICD-10-CM | POA: Diagnosis present

## 2018-04-09 DIAGNOSIS — E1122 Type 2 diabetes mellitus with diabetic chronic kidney disease: Secondary | ICD-10-CM | POA: Diagnosis present

## 2018-04-09 DIAGNOSIS — N184 Chronic kidney disease, stage 4 (severe): Secondary | ICD-10-CM | POA: Diagnosis present

## 2018-04-09 DIAGNOSIS — Z825 Family history of asthma and other chronic lower respiratory diseases: Secondary | ICD-10-CM

## 2018-04-09 DIAGNOSIS — Z8679 Personal history of other diseases of the circulatory system: Secondary | ICD-10-CM

## 2018-04-09 DIAGNOSIS — K922 Gastrointestinal hemorrhage, unspecified: Secondary | ICD-10-CM

## 2018-04-09 DIAGNOSIS — Z01818 Encounter for other preprocedural examination: Secondary | ICD-10-CM

## 2018-04-09 DIAGNOSIS — I480 Paroxysmal atrial fibrillation: Secondary | ICD-10-CM | POA: Diagnosis present

## 2018-04-09 DIAGNOSIS — N185 Chronic kidney disease, stage 5: Secondary | ICD-10-CM | POA: Diagnosis present

## 2018-04-09 DIAGNOSIS — Z951 Presence of aortocoronary bypass graft: Secondary | ICD-10-CM

## 2018-04-09 DIAGNOSIS — D500 Iron deficiency anemia secondary to blood loss (chronic): Secondary | ICD-10-CM | POA: Diagnosis not present

## 2018-04-09 DIAGNOSIS — Z7902 Long term (current) use of antithrombotics/antiplatelets: Secondary | ICD-10-CM

## 2018-04-09 DIAGNOSIS — N189 Chronic kidney disease, unspecified: Secondary | ICD-10-CM | POA: Diagnosis present

## 2018-04-09 DIAGNOSIS — M109 Gout, unspecified: Secondary | ICD-10-CM | POA: Diagnosis present

## 2018-04-09 DIAGNOSIS — F329 Major depressive disorder, single episode, unspecified: Secondary | ICD-10-CM | POA: Diagnosis present

## 2018-04-09 DIAGNOSIS — I2510 Atherosclerotic heart disease of native coronary artery without angina pectoris: Secondary | ICD-10-CM | POA: Diagnosis present

## 2018-04-09 DIAGNOSIS — Z8249 Family history of ischemic heart disease and other diseases of the circulatory system: Secondary | ICD-10-CM

## 2018-04-09 DIAGNOSIS — E8770 Fluid overload, unspecified: Secondary | ICD-10-CM | POA: Diagnosis not present

## 2018-04-09 DIAGNOSIS — M546 Pain in thoracic spine: Secondary | ICD-10-CM | POA: Diagnosis present

## 2018-04-09 DIAGNOSIS — K219 Gastro-esophageal reflux disease without esophagitis: Secondary | ICD-10-CM | POA: Diagnosis present

## 2018-04-09 DIAGNOSIS — I739 Peripheral vascular disease, unspecified: Secondary | ICD-10-CM | POA: Diagnosis present

## 2018-04-09 LAB — PROTIME-INR
INR: 1.24
Prothrombin Time: 15.5 seconds — ABNORMAL HIGH (ref 11.4–15.2)

## 2018-04-09 LAB — CBC
HCT: 23.1 % — ABNORMAL LOW (ref 36.0–46.0)
Hemoglobin: 7.3 g/dL — ABNORMAL LOW (ref 12.0–15.0)
MCH: 31.3 pg (ref 26.0–34.0)
MCHC: 31.6 g/dL (ref 30.0–36.0)
MCV: 99.1 fL (ref 78.0–100.0)
Platelets: 256 10*3/uL (ref 150–400)
RBC: 2.33 MIL/uL — ABNORMAL LOW (ref 3.87–5.11)
RDW: 17 % — ABNORMAL HIGH (ref 11.5–15.5)
WBC: 12.6 10*3/uL — ABNORMAL HIGH (ref 4.0–10.5)

## 2018-04-09 LAB — POC OCCULT BLOOD, ED: FECAL OCCULT BLD: POSITIVE — AB

## 2018-04-09 LAB — CBG MONITORING, ED: GLUCOSE-CAPILLARY: 152 mg/dL — AB (ref 70–99)

## 2018-04-09 LAB — BASIC METABOLIC PANEL
ANION GAP: 12 (ref 5–15)
BUN: 52 mg/dL — AB (ref 8–23)
CO2: 26 mmol/L (ref 22–32)
Calcium: 7.7 mg/dL — ABNORMAL LOW (ref 8.9–10.3)
Chloride: 94 mmol/L — ABNORMAL LOW (ref 98–111)
Creatinine, Ser: 3.03 mg/dL — ABNORMAL HIGH (ref 0.44–1.00)
GFR, EST AFRICAN AMERICAN: 18 mL/min — AB (ref >60)
GFR, EST NON AFRICAN AMERICAN: 15 mL/min — AB (ref >60)
Glucose, Bld: 166 mg/dL — ABNORMAL HIGH (ref 70–99)
POTASSIUM: 3.2 mmol/L — AB (ref 3.5–5.1)
SODIUM: 132 mmol/L — AB (ref 135–145)

## 2018-04-09 LAB — APTT: APTT: 29 s (ref 24–36)

## 2018-04-09 LAB — PREPARE RBC (CROSSMATCH)

## 2018-04-09 MED ORDER — SODIUM CHLORIDE 0.9 % IV SOLN
10.0000 mL/h | Freq: Once | INTRAVENOUS | Status: AC
  Administered 2018-04-09: 10 mL/h via INTRAVENOUS

## 2018-04-09 NOTE — ED Notes (Signed)
Patient signed consent form for blood transfusion . 

## 2018-04-09 NOTE — ED Notes (Signed)
Dr. Hillard Danker ( EDP) explained tests results and plan of care to pt.

## 2018-04-09 NOTE — ED Triage Notes (Addendum)
Pt presents with feeling weak after head injury that occurred on July 17th where she struck her head; pt states she has not felt well at all after that, unable to hold her head up, poor appetite, constipation; pt states she feels like her head weighs 500 lbs

## 2018-04-09 NOTE — H&P (Signed)
History and Physical   [name] IOM:[id number] DOB: [birth year] DOA: 04/09/2018  PCP: Bernerd Limbo, MD  Chief Complaint: weakness  HPI: this is a 67 year old woman with medical problems including chronic kidney disease stage 5, CAD status post CABG in 2012, repair of abdominal aortic aneurysm in May 2019, COPD, nocturnal use of oxygen, chronic lumbar back pain, and hypertension presenting with weakness.  Patient reports seeking medical attention for progressive weakness, reports becoming lightheaded and dizzy worsening over the past 4-5 days. She reports seeing reddish color stool over the past weeks, does report episode of constipation lasting nearly a week that was alleviated 3 days prior to admission with prune juice. No recent bright red blood per rectum. She denies any chest pain, palpitations, dyspnea on exertion, diarrhea, nausea, vomiting, fevers, chills.  At baseline she lives in an apartment, ambulate to the 4 wheeled walker. She reports being independent in her ADLs, does require assistance for her IADLs, her 2 daughters live locally. She spends her time watching television, enjoys spending time with her grandchildren. She continues to smoke daily. Not interested in quitting at this time. Denies alcohol use.  She had left upper extremity AV fistula made earlier this year, has not yet used. She follows with her outpatient nephrologist for chronic kidney disease. She reports having a colonoscopy years ago where she reports she had have part of her bowel resected.  ED Course: in the emergency department vital signs are normal for heart rate of about 60, respiratory rate of 24 which normalized, systolic blood pressure above 120. Diagnostics revealed hemoglobin 7.3, potassium 3.2, BUN of 52, creatinine 3.  Due to her report of having difficulty holding her head up, CT of the head and CT cervical spine performed did not reveal acute abnormality. Fecal occult blood test is positive. Per the  emergency medicine team, digital rectal exam revealed marroon colored stool.  Emergency medicine team ordered for blood transfusion for significant anemia. Hospital medicine consult further management.  Review of Systems: A complete ROS was obtained; pertinent positives negatives are denoted in the HPI. Otherwise, all systems are negative.   Past Medical History:  Diagnosis Date  . AAA (abdominal aortic aneurysm) ([place]) 5/09   3.7x3.3 by u/s 2009  . Abnormal EKG    deep TW inversions chronic  . Anemia   . CAD (coronary artery disease)    a. CABG 03/2011 LIMA to LAD, SVG to OM, SVG to PDA. b. cath 06/25/2015 DES to SVG to rPDA, patent LIMA to LAD, patent SVG to OM  . carotid stenosis 5/09   S/p L CEA ;  60-79% bilat ICA by preCABG dopplers 7/12  . CHF (congestive heart failure) ([place])   . Chronic back pain    "all over" (06/24/2015)  . Chronic bronchitis ([place])    "get it pretty much q yr" (06/24/2015)  . CKD (chronic kidney disease)   . Complication of anesthesia 1966   "problem w/ether"  . COPD (chronic obstructive pulmonary disease) ([place])   . Coronary artery disease 5/09   s/p CABG 7/12   L-LAD, S-OM, S-PDA  . Depression   . GERD (gastroesophageal reflux disease)    With hiatal hernia  . GIB (gastrointestinal bleeding) 9/11   S/p EGD with cautery at [facility]  . Heart murmur   . History of blood transfusion "a few times"   "all related to anemia" (06/24/2015)  . History of hiatal hernia   . Hyperlipidemia   . Hypertension   . Mitral  regurgitation    3+ MR by intraoperative TEE;  s/p MV repair with Dr. Roxy Manns 7/12  . Myocardial infarction [facility]) 2012 "several"  . On home oxygen therapy    "2 liters at night; negative for sleep apnea"  . PAD (peripheral artery disease) ([facility])    Severe; s/p bilateral renal artery stents, moderate in-stent restenosis  . Paroxysmal atrial fibrillation ([facility])    coumadin;  echo 9/07: EF 60%, mild LVH;  s/p [facility] 7/12 with LAA clipping  . Subclavian  artery stenosis, left ([facility])    stented by Dr. Trula Slade on 10/18 to help flow of her LIMA to LAD  . Type II diabetes mellitus ([place])    Social History   Socioeconomic History  . Marital status: Widowed    Spouse name: Not on file  . Number of children: Not on file  . Years of education: Not on file  . Highest education level: Not on file  Occupational History  . Occupation: Scientist, water quality in past    Employer: DISABLED  Social Needs  . Financial resource strain: Not on file  . Food insecurity:    Worry: Not on file    Inability: Not on file  . Transportation needs:    Medical: Not on file    Non-medical: Not on file  Tobacco Use  . Smoking status: Current Every Day Smoker    Years: 50.00    Types: Cigarettes  . Smokeless tobacco: Never Used  . Tobacco comment: 3/4 pk per day  Substance and Sexual Activity  . Alcohol use: Yes    Alcohol/week: 0.0 oz    Comment: occasionally  . Drug use: Yes    Types: Marijuana  . Sexual activity: Not on file  Lifestyle  . Physical activity:    Days per week: Not on file    Minutes per session: Not on file  . Stress: Not on file  Relationships  . Social connections:    Talks on phone: Not on file    Gets together: Not on file    Attends religious service: Not on file    Active member of club or organization: Not on file    Attends meetings of clubs or organizations: Not on file    Relationship status: Not on file  . Intimate partner violence:    Fear of current or ex partner: Not on file    Emotionally abused: Not on file    Physically abused: Not on file    Forced sexual activity: Not on file  Other Topics Concern  . Not on file  Social History Narrative   Widowed in 2009.   Family History  Problem Relation Age of Onset  . Emphysema Mother   . Heart disease Mother        before age 75  . Hypertension Mother   . Hyperlipidemia Mother   . Heart attack Mother   . AAA (abdominal aortic aneurysm) Mother        rupture  . Heart  attack Father 32  . Emphysema Father   . Heart disease Father        before age 19  . Hyperlipidemia Father   . Hypertension Father   . Peripheral vascular disease Father   . Diabetes Brother   . Heart disease Brother        before age 6  . Hyperlipidemia Brother   . Hypertension Brother   . Heart attack Brother        CABG  .  Heart disease Other        Vascular disease in grandparents, uncles and dad    Physical Exam: Vitals:   04/09/18 2148 04/09/18 2230 04/09/18 2300 04/09/18 2348  BP: (!) 128/55 (!) 154/52 (!) 156/46 (!) 146/74  Pulse: 68 61 (!) 59 68  Resp: 16 (!) 24 15 18   Temp:    98.2 F (36.8 C)  TempSrc:    Oral  SpO2: 94% 97% 96%   Weight:      Height:       General: Appears calm and comfortable. White woman. ENT: Grossly normal hearing, MMM. Cardiovascular: RRR. No M/R/G. Trace b/l LE edeman.  LUE AVF present with thrill - surrounding ecchymoses prominent. Respiratory: Wheezing present, scattered. No conversational dyspnea; breathing room air.  Normal respiratory effort.  Abdomen: Soft, mild abdominal pain peri-umbilical to deep palpation, no guarding or rebound Skin: No rash or induration seen on limited exam Musculoskeletal: Grossly normal tone BUE/BLE. Appropriate ROM.  Psychiatric: Grossly normal mood and affect. Neurologic: Moves all extremities in coordinated fashion.  I have personally reviewed the following labs, culture data, and imaging studies.  Assessment/Plan:  #Symptomatic anemia Course: presenting symptoms of progressive fatigue and light-headedness, hb found to be 7.3.  This has occurred in context of CKD and positive fecal occult blood test in ED; of note per the patient report did have colonoscopy years ago that found narrowing of her colon that per her report required surgical resection.   A/P: agree with transfusion of 1 unit of prbcs and assess response; likely contributors include CKD as well as occult GI blood loss.  Consider  inpatient vs outpatient GI evaluation for consideration of endoscopic evaluation, particularly given her history.  Iron studies to assess iron status.  #Other problems: -CAD: no CP at this time, continue clopidogrel + statin -HTN and hx of chronic diastolic HF: continue multiple home agents, including - BB (but at reduced dose of 12.5 mg to allow for more compensatory tachycardia in setting of anemia), CB, clonidine, hydralazine, Imdur. Holding furosemide given anemia that is symptomatic. -CKD stage V: Cr appears close to baseline (~3); monitor, suspect BUN rise may be 2/2 to her anemia and/or dehydration -COPD: mild wheezing, duo-nebs prn -Nocturnal oxygen use: patient reports using 2 L QHS -Chronic back pain: continue home opioids, gabapentin -Left foot neuropathy: stable, chronic, continue duloxetine  DVT prophylaxis: Subq Hep Code Status: Full code Disposition Plan: Anticipate D/C home later on 04/10/2018 potentially if symptomatically improved after prbc transfusion Consults called:  None, consider GI pending clinical course vs outpatient evaluation Admission status: admit to hospital medicine service   Cheri Rous, MD [facility] [phone]  If 7PM-7AM, please contact night-coverage [email or web address] Password [email or web address]
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
## 2018-04-09 NOTE — ED Notes (Signed)
1st unit PRBC started at 120 ml/hr , IV site intact , no adverse effect / respirations unlabored .

## 2018-04-10 ENCOUNTER — Ambulatory Visit (HOSPITAL_BASED_OUTPATIENT_CLINIC_OR_DEPARTMENT_OTHER): Payer: Medicare Other

## 2018-04-10 ENCOUNTER — Ambulatory Visit (HOSPITAL_COMMUNITY): Payer: Medicare Other

## 2018-04-10 DIAGNOSIS — R55 Syncope and collapse: Secondary | ICD-10-CM

## 2018-04-10 DIAGNOSIS — D649 Anemia, unspecified: Secondary | ICD-10-CM | POA: Diagnosis not present

## 2018-04-10 DIAGNOSIS — I6521 Occlusion and stenosis of right carotid artery: Secondary | ICD-10-CM

## 2018-04-10 LAB — FERRITIN: Ferritin: 766 ng/mL — ABNORMAL HIGH (ref 11–307)

## 2018-04-10 LAB — CBC
HCT: 29.3 % — ABNORMAL LOW (ref 36.0–46.0)
Hemoglobin: 9.3 g/dL — ABNORMAL LOW (ref 12.0–15.0)
MCH: 29.7 pg (ref 26.0–34.0)
MCHC: 31.7 g/dL (ref 30.0–36.0)
MCV: 93.6 fL (ref 78.0–100.0)
PLATELETS: 306 10*3/uL (ref 150–400)
RBC: 3.13 MIL/uL — AB (ref 3.87–5.11)
RDW: 21.8 % — AB (ref 11.5–15.5)
WBC: 15.1 10*3/uL — AB (ref 4.0–10.5)

## 2018-04-10 LAB — BASIC METABOLIC PANEL
ANION GAP: 13 (ref 5–15)
BUN: 46 mg/dL — AB (ref 8–23)
CO2: 28 mmol/L (ref 22–32)
Calcium: 8.3 mg/dL — ABNORMAL LOW (ref 8.9–10.3)
Chloride: 100 mmol/L (ref 98–111)
Creatinine, Ser: 2.95 mg/dL — ABNORMAL HIGH (ref 0.44–1.00)
GFR calc non Af Amer: 16 mL/min — ABNORMAL LOW (ref >60)
GFR, EST AFRICAN AMERICAN: 18 mL/min — AB (ref >60)
GLUCOSE: 163 mg/dL — AB (ref 70–99)
Potassium: 3.9 mmol/L (ref 3.5–5.1)
SODIUM: 141 mmol/L (ref 135–145)

## 2018-04-10 LAB — IRON AND TIBC
Iron: 46 ug/dL (ref 28–170)
SATURATION RATIOS: 11 % (ref 10.4–31.8)
TIBC: 428 ug/dL (ref 250–450)
UIBC: 382 ug/dL

## 2018-04-10 MED ORDER — ALLOPURINOL 100 MG PO TABS
100.0000 mg | ORAL_TABLET | Freq: Every day | ORAL | Status: DC
  Administered 2018-04-10 – 2018-04-21 (×12): 100 mg via ORAL
  Filled 2018-04-10 (×13): qty 1

## 2018-04-10 MED ORDER — ALBUTEROL SULFATE HFA 108 (90 BASE) MCG/ACT IN AERS: 2.0000 | INHALATION_SPRAY | RESPIRATORY_TRACT | Status: DC | PRN

## 2018-04-10 MED ORDER — VITAMIN D 1000 UNITS PO TABS
5000.0000 [IU] | ORAL_TABLET | Freq: Every day | ORAL | Status: DC
  Administered 2018-04-10 – 2018-04-21 (×11): 5000 [IU] via ORAL
  Filled 2018-04-10 (×12): qty 5

## 2018-04-10 MED ORDER — HYPROMELLOSE (GONIOSCOPIC) 2.5 % OP SOLN: 1.0000 [drp] | Freq: Three times a day (TID) | OPHTHALMIC | Status: DC | PRN

## 2018-04-10 MED ORDER — SODIUM CHLORIDE 0.9% FLUSH
3.0000 mL | Freq: Two times a day (BID) | INTRAVENOUS | Status: DC
  Administered 2018-04-10 – 2018-04-21 (×16): 3 mL via INTRAVENOUS

## 2018-04-10 MED ORDER — ISOSORBIDE MONONITRATE ER 30 MG PO TB24
15.0000 mg | ORAL_TABLET | Freq: Every day | ORAL | Status: DC
  Administered 2018-04-10 – 2018-04-21 (×12): 15 mg via ORAL
  Filled 2018-04-10 (×13): qty 1

## 2018-04-10 MED ORDER — CLONIDINE HCL 0.2 MG PO TABS
0.2000 mg | ORAL_TABLET | Freq: Three times a day (TID) | ORAL | Status: DC
  Administered 2018-04-10 – 2018-04-21 (×35): 0.2 mg via ORAL
  Filled 2018-04-10 (×35): qty 1

## 2018-04-10 MED ORDER — ATORVASTATIN CALCIUM 40 MG PO TABS
40.0000 mg | ORAL_TABLET | Freq: Every evening | ORAL | Status: DC
  Administered 2018-04-10 – 2018-04-20 (×10): 40 mg via ORAL
  Filled 2018-04-10 (×10): qty 1

## 2018-04-10 MED ORDER — POTASSIUM CHLORIDE CRYS ER 20 MEQ PO TBCR
20.0000 meq | EXTENDED_RELEASE_TABLET | Freq: Once | ORAL | Status: AC
  Administered 2018-04-10: 20 meq via ORAL
  Filled 2018-04-10: qty 1

## 2018-04-10 MED ORDER — DOCUSATE SODIUM 100 MG PO CAPS: 200.0000 mg | ORAL_CAPSULE | Freq: Every evening | ORAL | Status: DC | PRN

## 2018-04-10 MED ORDER — CLOPIDOGREL BISULFATE 75 MG PO TABS
75.0000 mg | ORAL_TABLET | Freq: Every day | ORAL | Status: DC
  Administered 2018-04-10 – 2018-04-11 (×2): 75 mg via ORAL
  Filled 2018-04-10 (×2): qty 1

## 2018-04-10 MED ORDER — DULOXETINE HCL 60 MG PO CPEP
60.0000 mg | ORAL_CAPSULE | Freq: Every day | ORAL | Status: DC
  Administered 2018-04-10 – 2018-04-21 (×12): 60 mg via ORAL
  Filled 2018-04-10 (×13): qty 1

## 2018-04-10 MED ORDER — GABAPENTIN 300 MG PO CAPS
300.0000 mg | ORAL_CAPSULE | Freq: Two times a day (BID) | ORAL | Status: DC
  Administered 2018-04-10 – 2018-04-21 (×23): 300 mg via ORAL
  Filled 2018-04-10 (×23): qty 1

## 2018-04-10 MED ORDER — HYDROCODONE-ACETAMINOPHEN 5-325 MG PO TABS
1.0000 | ORAL_TABLET | Freq: Three times a day (TID) | ORAL | Status: DC | PRN
Start: 2018-04-10 — End: 2018-04-11
  Administered 2018-04-11 (×2): 1 via ORAL
  Filled 2018-04-10 (×2): qty 1

## 2018-04-10 MED ORDER — HYDRALAZINE HCL 25 MG PO TABS
100.0000 mg | ORAL_TABLET | Freq: Three times a day (TID) | ORAL | Status: DC
  Administered 2018-04-10 – 2018-04-21 (×35): 100 mg via ORAL
  Filled 2018-04-10 (×35): qty 4

## 2018-04-10 MED ORDER — CARVEDILOL 12.5 MG PO TABS: 25.0000 mg | ORAL_TABLET | Freq: Two times a day (BID) | ORAL | Status: DC

## 2018-04-10 MED ORDER — HEPARIN SODIUM (PORCINE) 5000 UNIT/ML IJ SOLN
5000.0000 [IU] | Freq: Three times a day (TID) | INTRAMUSCULAR | Status: DC
  Administered 2018-04-10 – 2018-04-11 (×4): 5000 [IU] via SUBCUTANEOUS
  Filled 2018-04-10 (×4): qty 1

## 2018-04-10 MED ORDER — CARVEDILOL 12.5 MG PO TABS
12.5000 mg | ORAL_TABLET | Freq: Two times a day (BID) | ORAL | Status: DC
  Administered 2018-04-10 – 2018-04-21 (×22): 12.5 mg via ORAL
  Filled 2018-04-10 (×22): qty 1

## 2018-04-10 MED ORDER — AMLODIPINE BESYLATE 10 MG PO TABS
10.0000 mg | ORAL_TABLET | Freq: Every day | ORAL | Status: DC
  Administered 2018-04-10 – 2018-04-21 (×12): 10 mg via ORAL
  Filled 2018-04-10 (×12): qty 1

## 2018-04-10 MED ORDER — PANTOPRAZOLE SODIUM 40 MG PO TBEC
40.0000 mg | DELAYED_RELEASE_TABLET | Freq: Every day | ORAL | Status: DC
  Administered 2018-04-10 – 2018-04-21 (×12): 40 mg via ORAL
  Filled 2018-04-10 (×12): qty 1

## 2018-04-10 MED ORDER — IPRATROPIUM-ALBUTEROL 0.5-2.5 (3) MG/3ML IN SOLN: 3.0000 mL | RESPIRATORY_TRACT | Status: DC | PRN

## 2018-04-10 NOTE — Progress Notes (Signed)
Preliminary notes--Bilateral carotid duplex exam completed. Bilateral ICA demonstrate 40-59% stenosis based on category of velocities. Bilateral vertebral arteries patent with antegrade flow.  Hongying Landry Mellow (RDMS RVT) 04/10/18 5:46 PM

## 2018-04-10 NOTE — Progress Notes (Signed)
[name] is a 67 y.o. female patient admitted from ED awake, alert - oriented  X 4 - no acute distress noted.  VSS - Blood pressure (!) 139/100, pulse 70, temperature 99 F (37.2 C), temperature source Oral, resp. rate (!) 22, height 5\' 5"  (1.651 m), weight 60.5 kg (133 lb 6.1 oz), SpO2 100 %.    IV in place, occlusive dsg intact without redness.    Will cont to eval and treat per MD orders.  Vidal Schwalbe, RN 04/10/2018 6:49 AM
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
## 2018-04-10 NOTE — ED Notes (Signed)
Pt given coke and cup of ice upon request.

## 2018-04-10 NOTE — ED Notes (Signed)
1st unit PRBC infusing at 250 ml/hr. with no adverse effect, respirations unlabored / afebrile . IV site intact .

## 2018-04-10 NOTE — Care Management Obs Status (Signed)
[place] NOTIFICATION   Patient Details  Name: [name] MRN: [id number] Date of Birth: March 31, 1951   Medicare Observation Status Notification Given:       [name], RN 04/10/2018, 3:42 PM

## 2018-04-10 NOTE — ED Notes (Signed)
Patient given [place] sandwich/crakers and coffee .

## 2018-04-10 NOTE — Evaluation (Signed)
Physical Therapy Evaluation Patient Details Name: [name] MRN: [id number] DOB: 08-18-1951 Today's Date: 04/10/2018   History of Present Illness  67 year old woman with medical problems including chronic kidney disease stage 5, CAD status post CABG in 2012, repair of abdominal aortic aneurysm in May 2019, COPD, nocturnal use of oxygen, chronic lumbar back pain, and hypertension presenting with weakness.  Clinical Impression  Pt presents with deficits in gait and balance and will continue to benefit from acute PT services to address deficits and improve functional independence. Pt states she has had HHPT before and would like to have those services again.    Follow Up Recommendations Home health PT    Equipment Recommendations  None recommended by PT    Recommendations for Other Services       Precautions / Restrictions Precautions Precautions: Fall Restrictions Weight Bearing Restrictions: No      Mobility  Bed Mobility               General bed mobility comments: pt seated edge of bed on PT arrival  Transfers Overall transfer level: Needs assistance Equipment used: Rolling walker (2 wheeled) Transfers: Sit to/from Stand Sit to Stand: Supervision            Ambulation/Gait Ambulation/Gait assistance: Supervision;Min assist Gait Distance (Feet): 150 Feet Assistive device: Rolling walker (2 wheeled)       General Gait Details: pt able to perform gait with RW safely, then attempted to reach out of BOS with 1 UE supported on RW and required min A to prevent fall  Stairs            Wheelchair Mobility    Modified Rankin (Stroke Patients Only)       Balance Overall balance assessment: Needs assistance           Standing balance-Leahy Scale: Poor Standing balance comment: pt requires bilat UEs on RW to prevent LOB                             Pertinent Vitals/Pain Pain Assessment: No/denies pain    Home Living  Family/patient expects to be discharged to:: Private residence Living Arrangements: Alone Available Help at Discharge: Family;Available PRN/intermittently Type of Home: Apartment Home Access: Level entry     Home Layout: One level Home Equipment: Walker - 4 wheels      Prior Function Level of Independence: Independent with assistive device(s)         Comments: used RW after abdominal surgery, then was able to progress to function without device, then began using RW again in the past week     Hand Dominance        Extremity/Trunk Assessment   Upper Extremity Assessment Upper Extremity Assessment: Generalized weakness    Lower Extremity Assessment Lower Extremity Assessment: Generalized weakness    Cervical / Trunk Assessment Cervical / Trunk Assessment: Kyphotic  Communication   Communication: No difficulties  Cognition Arousal/Alertness: Awake/alert Behavior During Therapy: WFL for tasks assessed/performed Overall Cognitive Status: Within Functional Limits for tasks assessed                                        General Comments      Exercises     Assessment/Plan    PT Assessment Patient needs continued PT services  PT Problem List Decreased strength;Decreased activity tolerance;Decreased balance  PT Treatment Interventions Gait training;Therapeutic exercise;Patient/family education;Therapeutic activities;DME instruction;Stair training;Balance training;Functional mobility training;Neuromuscular re-education    PT Goals (Current goals can be found in the Care Plan section)  Acute Rehab PT Goals Patient Stated Goal: figure out why i'm bleeding PT Goal Formulation: With patient Time For Goal Achievement: 04/24/18 Potential to Achieve Goals: Good    Frequency Min 3X/week   Barriers to discharge        Co-evaluation               AM-PAC PT "6 Clicks" Daily Activity  Outcome Measure Difficulty turning over in bed  (including adjusting bedclothes, sheets and blankets)?: A Little Difficulty moving from lying on back to sitting on the side of the bed? : A Little Difficulty sitting down on and standing up from a chair with arms (e.g., wheelchair, bedside commode, etc,.)?: A Little Help needed moving to and from a bed to chair (including a wheelchair)?: A Little Help needed walking in hospital room?: A Little Help needed climbing 3-5 steps with a railing? : A Little 6 Click Score: 18    End of Session   Activity Tolerance: Patient tolerated treatment well Patient left: in bed;with call bell/phone within reach Nurse Communication: Mobility status PT Visit Diagnosis: Unsteadiness on feet (R26.81);Difficulty in walking, not elsewhere classified (R26.2);Muscle weakness (generalized) (M62.81)    Time: 1015-1030 PT Time Calculation (min) (ACUTE ONLY): 15 min   Charges:   PT Evaluation $PT Eval Low Complexity: 1 Low          Isabelle Course, PT, DPT  [name] 04/10/2018, 10:38 AM

## 2018-04-10 NOTE — Care Management Note (Signed)
Case Management Note  Patient Details  Name: [name] MRN: [id number] Date of Birth: 16-Jul-1951  Subjective/Objective:                    Action/Plan:  Patient already has rolling walker at home  Expected Discharge Date:                  Expected Discharge Plan:  [place]  In-House Referral:     Discharge planning Services  CM Consult  Post Acute Care Choice:  Home Health Choice offered to:  Patient  DME Arranged:  N/A DME Agency:  NA  HH Arranged:  PT [place] Agency:  [place]  Status of Service:  Completed, signed off  If discussed at [place] of Stay Meetings, dates discussed:    Additional Comments:  [name], RN 04/10/2018, 3:40 PM

## 2018-04-10 NOTE — ED Notes (Signed)
1st unit PRBC completed with no adverse effect , afebrile/respirations unlabored .

## 2018-04-10 NOTE — ED Notes (Signed)
Patient placement advised RN that room assignment changed.

## 2018-04-11 ENCOUNTER — Other Ambulatory Visit: Payer: Self-pay

## 2018-04-11 ENCOUNTER — Encounter (HOSPITAL_COMMUNITY): Payer: Self-pay | Admitting: General Practice

## 2018-04-11 DIAGNOSIS — Z7901 Long term (current) use of anticoagulants: Secondary | ICD-10-CM | POA: Diagnosis not present

## 2018-04-11 DIAGNOSIS — D62 Acute posthemorrhagic anemia: Secondary | ICD-10-CM | POA: Diagnosis not present

## 2018-04-11 DIAGNOSIS — N185 Chronic kidney disease, stage 5: Secondary | ICD-10-CM

## 2018-04-11 DIAGNOSIS — F17200 Nicotine dependence, unspecified, uncomplicated: Secondary | ICD-10-CM | POA: Diagnosis not present

## 2018-04-11 DIAGNOSIS — D649 Anemia, unspecified: Secondary | ICD-10-CM | POA: Diagnosis not present

## 2018-04-11 DIAGNOSIS — K922 Gastrointestinal hemorrhage, unspecified: Secondary | ICD-10-CM | POA: Diagnosis not present

## 2018-04-11 LAB — CBC
HCT: 25.5 % — ABNORMAL LOW (ref 36.0–46.0)
Hemoglobin: 8 g/dL — ABNORMAL LOW (ref 12.0–15.0)
MCH: 30.1 pg (ref 26.0–34.0)
MCHC: 31.4 g/dL (ref 30.0–36.0)
MCV: 95.9 fL (ref 78.0–100.0)
PLATELETS: 269 10*3/uL (ref 150–400)
RBC: 2.66 MIL/uL — ABNORMAL LOW (ref 3.87–5.11)
RDW: 21.7 % — AB (ref 11.5–15.5)
WBC: 14.4 10*3/uL — ABNORMAL HIGH (ref 4.0–10.5)

## 2018-04-11 LAB — BASIC METABOLIC PANEL
Anion gap: 13 (ref 5–15)
BUN: 42 mg/dL — AB (ref 8–23)
CALCIUM: 8 mg/dL — AB (ref 8.9–10.3)
CO2: 26 mmol/L (ref 22–32)
Chloride: 98 mmol/L (ref 98–111)
Creatinine, Ser: 2.78 mg/dL — ABNORMAL HIGH (ref 0.44–1.00)
GFR calc Af Amer: 19 mL/min — ABNORMAL LOW (ref >60)
GFR, EST NON AFRICAN AMERICAN: 17 mL/min — AB (ref >60)
GLUCOSE: 164 mg/dL — AB (ref 70–99)
POTASSIUM: 4.3 mmol/L (ref 3.5–5.1)
Sodium: 137 mmol/L (ref 135–145)

## 2018-04-11 MED ORDER — SODIUM CHLORIDE 0.9 % IV SOLN
510.0000 mg | Freq: Once | INTRAVENOUS | Status: AC
  Administered 2018-04-11: 510 mg via INTRAVENOUS
  Filled 2018-04-11: qty 17

## 2018-04-11 MED ORDER — HYDROCODONE-ACETAMINOPHEN 5-325 MG PO TABS
1.0000 | ORAL_TABLET | Freq: Four times a day (QID) | ORAL | Status: DC | PRN
  Administered 2018-04-11 – 2018-04-21 (×9): 1 via ORAL
  Filled 2018-04-11 (×10): qty 1

## 2018-04-11 MED ORDER — DARBEPOETIN ALFA 60 MCG/0.3ML IJ SOSY
60.0000 ug | PREFILLED_SYRINGE | Freq: Once | INTRAMUSCULAR | Status: AC
  Administered 2018-04-11: 60 ug via SUBCUTANEOUS
  Filled 2018-04-11: qty 0.3

## 2018-04-11 NOTE — Consult Note (Addendum)
Referring Provider: [facility]  Primary Care Physician:  Bernerd Limbo, MD Primary Gastroenterologist: Saw Dr. Dorrene German 6 years ago.  Reason for Consultation:   Anemia and maroon, heme positive stools.     ASSESSMENT AND PLAN:    67 yo female with multiple medical problems, significant vascular history on chronic Plavix. She had AAA repair in mid May with subsequent large RP hematoma and drop in hgb from 11 range to 9 range. Now admitted with with worsening anemia and maroon, heme positive stool. Hgb 7.3 on plavix. Sounds like she has chronic anemia describing iron infusions, chronic oral iron -for further evaluation patient will be scheduled for EGD, okay to do on plavix. Her stools are now light brown off iron and hgb stable after blood transfusion. The risks and benefits of EGD were discussed and the patient agrees to proceed.  -If no recurrent bleeding we can see her in office for follow up. We will get records from [facility] where she saw GI there ~ 5 years ago (? For anemia). Colonoscopy incomplete due to narrowing.  CKD, she has fistula but not yet on dialysis. CKD likely contributing to anemia.     Attending physician's note   I have taken an interval history, reviewed the chart and examined the patient. I agree with the Advanced Practitioner's note, impression and recommendations.   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  She had GI work-up approximately 5 years ago by Dr. Dorrene German -negative EGD, incomplete colonoscopy (due to sigmoid hypertrophy/angulation) followed by virtual colonoscopy. Plan: EGD in a.m. while on Plavix.  Trend CBC, if still with problems, repeat NCCT abdomen followed by colonoscopy preferably off Plavix (as outpatient).   Amy Austria, MD    HPI: [name] is a 67 y.o. female with multiple medical  problems not limited to cholelithiasis, anxiety, chronic back pain, depression, CAD, DM,  s/p CABG in 2012, hx of perforated appendix in 2016, HTN, CKD V with AV fistula, AAA repair in May 2019 (Dr. Trula Slade) with subsequent large retroperitoneal hematoma. She is on chronic Plavix.   Patient presented to ED 2 days ago with complaints of weakness, dizziness.  In mid July she fell and struck her head. Head and cervical spine CT scan negative. In ED she was found to be anemic with heme positive, maroon stool on DRE in ED. Her vitals were stable. Hgb was 7.3, MCV 99, WBC 12.6. INR 1.24. She was transfused a unit of blood yesterday morning. Hgb rose to 9.3, has settled at 8.0 this am.   It sounds like [name] has a history of anemia, says she has received iron infusions in the past and also takes iron but can't remember for how long she has taken it. She endorses dark stools on iron but a few days prior to admission developed maroon colored stools. Currently not on oral iron and says stools are light brown. She is anemic at baseline. Prior to AAA repair in May her hgb was ~ 11.0. Following AAA repair she had a large retroperitoneal hematoma with decline in hgb to 9.5. Hgb checked mid July and it was 8.5.  She comes in this admission with hgb of 7.3, not a significant drop from form last CBC.   She is on Plavix, denies NSAIDs. She denies N/V but has been having pain in back going through  to the front under ribcage bilaterally. She recently lost a lot of weight. The large RP hematoma was pressing on stomach and causing discomfort.   [name] had an EGD and flex sig in April 2013, cannot remember why but possibly for bleeding. She describes incomplete colonoscopies. I found a report in Care Everywhere from Aprl 2013: Flex sig - unable to pass scope beyond 30 cm c/w history of some muscular hypertrophy in the area, probably from diverticulitis. Thee was mild sigmoid diverticulosis, internal hemorrhoids. 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 Sounds like she had a virtual colonoscopy afterwards but I can't find results.   Past Medical History:  Diagnosis Date  . AAA (abdominal aortic aneurysm) ([place]) 5/09   3.7x3.3 by u/s 2009  . Abnormal EKG    deep TW inversions chronic  . Anemia   . CAD (coronary artery disease)    a. CABG 03/2011 LIMA to LAD, SVG to OM, SVG to PDA. b. cath 06/25/2015 DES to SVG to rPDA, patent LIMA to LAD, patent SVG to OM  . carotid stenosis 5/09   S/p L CEA ;  60-79% bilat ICA by preCABG dopplers 7/12  . CHF (congestive heart failure) ([place])   . Chronic back pain    "all over" (06/24/2015)  . Chronic bronchitis ([place])    "get it pretty much q yr" (06/24/2015)  . CKD (chronic kidney disease)   . Complication of anesthesia 1966   "problem w/ether"  . COPD (chronic obstructive pulmonary disease) ([place])   . Coronary artery disease 5/09   s/p CABG 7/12   L-LAD, S-OM, S-PDA  . Depression   . GERD (gastroesophageal reflux disease)    With hiatal hernia  . GIB (gastrointestinal bleeding) 9/11   S/p EGD with cautery at [facility]  . Heart murmur   . History of blood transfusion "a few times"   "all related to anemia" (06/24/2015)  . History of hiatal hernia   . Hyperlipidemia   . Hypertension   . Mitral regurgitation    3+ MR by intraoperative TEE;  s/p MV repair with Dr. Roxy Manns 7/12  . Myocardial infarction [facility]) 2012 "several"  . On home oxygen therapy    "2 liters at night; negative for sleep apnea"  . PAD (peripheral artery disease) ([facility])    Severe; s/p bilateral renal artery stents, moderate in-stent restenosis  . Paroxysmal atrial fibrillation ([facility])    coumadin;  echo 9/07: EF 60%, mild LVH;  s/p Cox Maze 7/12 with LAA clipping  . Subclavian artery stenosis, left ([facility])    stented by Dr. Trula Slade on 10/18 to help flow of her LIMA to LAD  . Type II diabetes mellitus ([place])     Past Surgical History:  Procedure Laterality Date  . A/V FISTULAGRAM Left 03/21/2018   Procedure:  A/V FISTULAGRAM;  Surgeon: Serafina Mitchell, MD;  Location: [place] [facility];  Service: Cardiovascular;  Laterality: Left;  . ABDOMINAL AORTIC ENDOVASCULAR STENT GRAFT N/A 01/17/2018   Procedure: ABDOMINAL AORTIC ENDOVASCULAR STENT GRAFT WITH CO2;  Surgeon: Serafina Mitchell, MD;  Location: [place];  Service: Vascular;  Laterality: N/A;  . ABDOMINAL HYSTERECTOMY  1990's  . APPENDECTOMY  Aug. 11, 2016   Ruptured  . AV FISTULA PLACEMENT Left 11/03/2017   Procedure: ARTERIOVENOUS (AV) FISTULA CREATION LEFT ARM;  Surgeon: Serafina Mitchell, MD;  Location: [place];  Service: Vascular;  Laterality: Left;  . BOWEL RESECTION  2013  . CARDIAC CATHETERIZATION N/A 06/24/2015   Procedure:  Left Heart Cath and Cors/Grafts Angiography;  Surgeon: Leonie Man, MD;  Location: [place] [facility];  Service: Cardiovascular;  Laterality: N/A;  . CARDIAC CATHETERIZATION N/A 06/25/2015   Procedure: Coronary Stent Intervention;  Surgeon: Sherren Mocha, MD;  Location: [place] [facility];  Service: Cardiovascular;  Laterality: N/A;  . CAROTID ENDARTERECTOMY Left   . CATARACT EXTRACTION Right 03/27/2018  . CORONARY ANGIOPLASTY    . CORONARY ARTERY BYPASS GRAFT  03/05/2011   CABG X3 (LIMA to LAD, SVG to OM, SVG to PDA, EVH via left thigh  . CORONARY STENT PLACEMENT    . LACERATION REPAIR Right 1990's   WRIST  . MAZE  03/05/2011   complete biatrial lesion set with clipping of LA appendage  . MITRAL VALVE REPAIR  03/05/2011   43mm Memo 3D ring annuloplasty for ischemic MR  . PERIPHERAL VASCULAR BALLOON ANGIOPLASTY Left 03/21/2018   Procedure: PERIPHERAL VASCULAR BALLOON ANGIOPLASTY;  Surgeon: Serafina Mitchell, MD;  Location: [place] [facility];  Service: Cardiovascular;  Laterality: Left;  . PERIPHERAL VASCULAR CATHETERIZATION N/A 06/24/2015   Procedure: Aortic Arch Angiography;  Surgeon: Serafina Mitchell, MD;  Location: [place] [facility];  Service: Cardiovascular;  Laterality: N/A;  . PERIPHERAL VASCULAR  CATHETERIZATION  06/24/2015   Procedure: Peripheral Vascular Intervention;  Surgeon: Serafina Mitchell, MD;  Location: [place] [facility];  Service: Cardiovascular;;  . PERIPHERAL VASCULAR CATHETERIZATION N/A 06/24/2015   Procedure: Abdominal Aortogram;  Surgeon: Serafina Mitchell, MD;  Location: [place] [facility];  Service: Cardiovascular;  Laterality: N/A;  . RENAL ANGIOGRAM Bilateral 09/11/2013   Procedure: RENAL ANGIOGRAM;  Surgeon: Serafina Mitchell, MD;  Location: [facility];  Service: Cardiovascular;  Laterality: Bilateral;  . RIGHT FEMORAL-POPLITEAL BYPASS      Prior to Admission medications   Medication Sig Start Date End Date Taking? Authorizing Provider  albuterol (PROVENTIL HFA;VENTOLIN HFA) 108 (90 Base) MCG/ACT inhaler Inhale 2 puffs into the lungs every 4 (four) hours as needed for wheezing or shortness of breath. ProAir 10/08/15  Yes Delfina Redwood, MD  allopurinol (ZYLOPRIM) 100 MG tablet Take 100 mg by mouth daily. 02/02/17  Yes [provider]  ALPRAZolam Duanne Moron) 0.5 MG tablet Take 0.25 mg by mouth at bedtime as needed for anxiety.    Yes [provider]  amLODipine (NORVASC) 10 MG tablet Take 10 mg by mouth daily.     Yes [provider]  Artificial Tear Solution (SOOTHE XP OP) Apply 1-2 drops to eye 3 (three) times daily as needed (irration).   Yes [provider]  atorvastatin (LIPITOR) 40 MG tablet Take 40 mg by mouth every evening.  12/07/17  Yes [provider]  Besifloxacin HCl (BESIVANCE) 0.6 % SUSP Place 1 drop into the right eye 3 (three) times daily.   Yes [provider]  Bromfenac Sodium (PROLENSA) 0.07 % SOLN Place 1 drop into the right eye daily.   Yes [provider]  carvedilol (COREG) 25 MG tablet TAKE 1 TABLET BY MOUTH 2 TIMES A DAY WITH A MEAL Patient taking differently: TAKE 25 MG BY MOUTH 2 TIMES A DAY WITH A MEAL 11/04/15  Yes Truitt Merle C, NP  Cholecalciferol (VITAMIN D3) 5000 units TABS Take  5,000 Units by mouth daily.   Yes [provider]  cloNIDine (CATAPRES) 0.2 MG tablet Take 1 tablet (0.2 mg total) by mouth 3 (three) times daily. 07/10/15  Yes Richardson Dopp T, PA-C  clopidogrel (PLAVIX) 75 MG tablet  Take 1 tablet (75 mg total) by mouth daily with breakfast. 06/26/15  Yes Meng, [place], PA  Difluprednate (DUREZOL) 0.05 % EMUL Place 1 drop into the right eye daily.   Yes [provider]  docusate sodium (COLACE) 100 MG capsule Take 200 mg by mouth at bedtime as needed for mild constipation.   Yes [provider]  DULoxetine (CYMBALTA) 60 MG capsule Take one capsule (60 mg dose) by mouth daily. 10/26/17  Yes [provider]  famotidine (PEPCID) 20 MG tablet TAKE 1 TABLET (20 MG TOTAL) BY MOUTH DAILY BEFORE SUPPER. 01/27/16  Yes Sherren Mocha, MD  fenofibrate 160 MG tablet Take 1 tablet (160 mg total) by mouth daily. 10/08/15  Yes Delfina Redwood, MD  Ferrous Sulfate (IRON) 28 MG TABS Take 28 mg by mouth 2 (two) times daily.   Yes [provider]  furosemide (LASIX) 40 MG tablet Take 40 mg by mouth daily. May take an additional tablet day as needed for weight gain  05/30/17  Yes [provider]  gabapentin (NEURONTIN) 300 MG capsule Take 1 capsule (300 mg total) by mouth 2 (two) times daily. 03/06/18  Yes Serafina Mitchell, MD  hydrALAZINE (APRESOLINE) 100 MG tablet Take 100 mg by mouth 3 (three) times daily.   Yes [provider]  HYDROcodone-acetaminophen (NORCO/VICODIN) 5-325 MG tablet Take 1 tablet by mouth every 8 (eight) hours as needed for moderate pain. 11/03/17  Yes Rhyne, Hulen Shouts, PA-C  isosorbide mononitrate (IMDUR) 30 MG 24 hr tablet Take 0.5 tablets (15 mg total) by mouth daily. 12/02/15  Yes Sherren Mocha, MD  mirtazapine (REMERON) 7.5 MG tablet Take 7.5 mg by mouth at bedtime.  02/15/18 08/14/18 Yes [provider]  pantoprazole (PROTONIX) 40 MG tablet Take 1 tablet (40 mg total) by mouth daily. 06/26/15   Yes Almyra Deforest, PA  Potassium 99 MG TABS Take 99 mg by mouth daily.    Yes [provider]  zolpidem (AMBIEN) 10 MG tablet Take 10 mg by mouth at bedtime.   Yes [provider]  nitroGLYCERIN (NITRO-DUR) 0.2 mg/hr patch Place 1 patch (0.2 mg total) onto the skin daily. Remove old patch before applying new patch. Apply around the same time every day. Patient not taking: Reported on 04/09/2018 02/20/18   Serafina Mitchell, MD    Current Facility-Administered Medications  Medication Dose Route Frequency Provider Last Rate Last Dose  . allopurinol (ZYLOPRIM) tablet 100 mg  100 mg Oral Daily Vilma Prader, MD   100 mg at 04/11/18 1033  . amLODipine (NORVASC) tablet 10 mg  10 mg Oral Daily Vilma Prader, MD   10 mg at 04/11/18 1033  . atorvastatin (LIPITOR) tablet 40 mg  40 mg Oral QPM Vilma Prader, MD   40 mg at 04/10/18 1819  . carvedilol (COREG) tablet 12.5 mg  12.5 mg Oral BID WC Vilma Prader, MD   12.5 mg at 04/11/18 1033  . cholecalciferol (VITAMIN D) tablet 5,000 Units  5,000 Units Oral Daily Vilma Prader, MD   5,000 Units at 04/11/18 1033  . cloNIDine (CATAPRES) tablet 0.2 mg  0.2 mg Oral Q8H Vilma Prader, MD   0.2 mg at 04/11/18 8469  . Darbepoetin Alfa (ARANESP) injection 60 mcg  60 mcg Subcutaneous Once Vann, Jessica U, DO      . docusate sodium (COLACE) capsule 200 mg  200 mg Oral QHS PRN Vilma Prader, MD      . DULoxetine (  CYMBALTA) DR capsule 60 mg  60 mg Oral Daily Vilma Prader, MD   60 mg at 04/11/18 1033  . ferumoxytol (FERAHEME) 510 mg in sodium chloride 0.9 % 100 mL IVPB  510 mg Intravenous Once Vann, Jessica U, DO      . gabapentin (NEURONTIN) capsule 300 mg  300 mg Oral BID Vilma Prader, MD   300 mg at 04/11/18 1033  . heparin injection 5,000 Units  5,000 Units Subcutaneous Q8H Vilma Prader, MD   5,000 Units at 04/11/18 3474  . hydrALAZINE (APRESOLINE) tablet 100 mg  100 mg Oral Q8H Vilma Prader, MD   100  mg at 04/11/18 2595  . HYDROcodone-acetaminophen (NORCO/VICODIN) 5-325 MG per tablet 1 tablet  1 tablet Oral Q8H PRN Vilma Prader, MD   1 tablet at 04/11/18 1029  . hydroxypropyl methylcellulose / hypromellose (ISOPTO TEARS / GONIOVISC) 2.5 % ophthalmic solution 1 drop  1 drop Both Eyes TID PRN Vilma Prader, MD      . ipratropium-albuterol (DUONEB) 0.5-2.5 (3) MG/3ML nebulizer solution 3 mL  3 mL Nebulization Q4H PRN Vilma Prader, MD      . isosorbide mononitrate (IMDUR) 24 hr tablet 15 mg  15 mg Oral Daily Vilma Prader, MD   15 mg at 04/11/18 1033  . pantoprazole (PROTONIX) EC tablet 40 mg  40 mg Oral Daily Vilma Prader, MD   40 mg at 04/11/18 1033  . sodium chloride flush (NS) 0.9 % injection 3 mL  3 mL Intravenous Q12H Vilma Prader, MD   3 mL at 04/10/18 2132    Allergies as of 04/09/2018 - Review Complete 04/09/2018  Allergen Reaction Noted  . Isosorbide Other (See Comments) 10/28/2015  . Codeine Itching 10/28/2015  . Contrast media [iodinated diagnostic agents] Itching and Other (See Comments) 05/29/2012  . Ioxaglate Itching and Other (See Comments) 02/24/2015  . Metrizamide Itching and Other (See Comments) 07/03/2014  . Percocet [oxycodone-acetaminophen] Itching and Other (See Comments) 09/04/2013    Family History  Problem Relation Age of Onset  . Emphysema Mother   . Heart disease Mother        before age 21  . Hypertension Mother   . Hyperlipidemia Mother   . Heart attack Mother   . AAA (abdominal aortic aneurysm) Mother        rupture  . Heart attack Father 82  . Emphysema Father   . Heart disease Father        before age 22  . Hyperlipidemia Father   . Hypertension Father   . Peripheral vascular disease Father   . Diabetes Brother   . Heart disease Brother        before age 65  . Hyperlipidemia Brother   . Hypertension Brother   . Heart attack Brother        CABG  . Heart disease Other        Vascular disease in grandparents,  uncles and dad    Social History   Socioeconomic History  . Marital status: Widowed    Spouse name: Not on file  . Number of children: Not on file  . Years of education: Not on file  . Highest education level: Not on file  Occupational History  . Occupation: Scientist, water quality in past    Employer: DISABLED  Social Needs  . Financial resource strain: Not on file  . Food insecurity:    Worry: Not on file    Inability: Not  on file  . Transportation needs:    Medical: Not on file    Non-medical: Not on file  Tobacco Use  . Smoking status: Current Every Day Smoker    Years: 50.00    Types: Cigarettes  . Smokeless tobacco: Never Used  . Tobacco comment: 3/4 pk per day  Substance and Sexual Activity  . Alcohol use: Yes    Alcohol/week: 0.0 oz    Comment: occasionally  . Drug use: Yes    Types: Marijuana  . Sexual activity: Not on file  Lifestyle  . Physical activity:    Days per week: Not on file    Minutes per session: Not on file  . Stress: Not on file  Relationships  . Social connections:    Talks on phone: Not on file    Gets together: Not on file    Attends religious service: Not on file    Active member of club or organization: Not on file    Attends meetings of clubs or organizations: Not on file    Relationship status: Not on file  . Intimate partner violence:    Fear of current or ex partner: Not on file    Emotionally abused: Not on file    Physically abused: Not on file    Forced sexual activity: Not on file  Other Topics Concern  . Not on file  Social History Narrative   Widowed in 2009.    Review of Systems: All systems reviewed and negative except where noted in HPI.  Physical Exam: Vital signs in last 24 hours: Temp:  [98.4 F (36.9 C)-99.1 F (37.3 C)] 99 F (37.2 C) (08/06 0502) Pulse Rate:  [58-64] 62 (08/06 1029) Resp:  [16-17] 16 (08/06 0502) BP: (132-157)/(41-64) 150/49 (08/06 1029) SpO2:  [93 %-99 %] 93 % (08/06 1029) Last BM Date:  04/10/18 General:   Alert, chronically ill appearing female in NAD Psych:  Pleasant, cooperative. Normal mood and affect. Eyes:  Pupils equal, sclera clear, no icterus.   Conjunctiva pink. Ears:  Normal auditory acuity. Nose:  No deformity, discharge,  or lesions. Neck:  Supple; no masses Lungs:  Clear throughout to auscultation.   No wheezes, crackles, or rhonchi.  Heart:  Regular rate and rhythm; +murmur, no edema Abdomen:  Soft, non-distended, mild diffuse upper abdominal tenderness.  BS active, no palp mass    Rectal:  Deferred  Msk:  Symmetrical without gross deformities. . Neurologic:  Alert and  oriented x4;  grossly normal neurologically. Skin:  Intact without significant lesions or rashes.Scattered bruises.   Intake/Output from previous day: 08/05 0701 - 08/06 0700 In: 780 [P.O.:780] Out: -  Intake/Output this shift: No intake/output data recorded.  Lab Results: Recent Labs    04/09/18 1958 04/10/18 0500 04/11/18 0451  WBC 12.6* 15.1* 14.4*  HGB 7.3* 9.3* 8.0*  HCT 23.1* 29.3* 25.5*  PLT 256 306 269   BMET Recent Labs    04/09/18 1958 04/10/18 0500 04/11/18 0451  NA 132* 141 137  K 3.2* 3.9 4.3  CL 94* 100 98  CO2 26 28 26   GLUCOSE 166* 163* 164*  BUN 52* 46* 42*  CREATININE 3.03* 2.95* 2.78*  CALCIUM 7.7* 8.3* 8.0*   LFT No results for input(s): PROT, ALBUMIN, AST, ALT, ALKPHOS, BILITOT, BILIDIR, IBILI in the last 72 hours. PT/INR Recent Labs    04/09/18 2240  LABPROT 15.5*  INR 1.24   Studies/Results: Ct Head Wo Contrast  Result Date: 04/09/2018 CLINICAL DATA:  Head  injury July 17th, subsequent weakness. History of AV fistula, aortic stent graft, atrial fibrillation, diabetes. EXAM: CT HEAD WITHOUT CONTRAST CT CERVICAL SPINE WITHOUT CONTRAST TECHNIQUE: Multidetector CT imaging of the head and cervical spine was performed following the standard protocol without intravenous contrast. Multiplanar CT image reconstructions of the cervical spine were  also generated. COMPARISON:  MRI of the head August 31, 2006 FINDINGS: CT HEAD FINDINGS BRAIN: No intraparenchymal hemorrhage, mass effect nor midline shift. Borderline parenchymal brain volume loss. Patchy supratentorial white matter hypodensities within normal range for patient's age, though non-specific are most compatible with chronic small vessel ischemic disease. No acute large vascular territory infarcts. No abnormal extra-axial fluid collections. Basal cisterns are patent. VASCULAR: Moderate to severe calcific atherosclerosis of the carotid siphons and included vertebral arteries. SKULL: No skull fracture. No significant scalp soft tissue swelling. SINUSES/ORBITS: Trace paranasal sinus mucosal thickening. Mastoid air cells are well aerated.The included ocular globes and orbital contents are non-suspicious. OTHER: None. CT CERVICAL SPINE FINDINGS ALIGNMENT: Maintained lordosis. Vertebral bodies in alignment. SKULL BASE AND VERTEBRAE: Cervical vertebral bodies and posterior elements are intact. Moderate C4-5 and C5-6 disc height loss and endplate spurring compatible with degenerative discs. No destructive bony lesions. C1-2 articulation maintained. SOFT TISSUES AND SPINAL CANAL: Nonacute. Partially imaged subclavian artery stent severe calcific atherosclerosis RIGHT carotid bifurcation. Ectatic LEFT carotid bifurcation consistent with endarterectomy. DISC LEVELS: No significant osseous canal stenosis. Moderate RIGHT C4-5, mild C5-6 neural foraminal narrowing. UPPER CHEST: Lung apices are clear. OTHER: None. IMPRESSION: CT HEAD: 1. No acute intracranial process. 2. Mild parenchymal brain volume loss. 3. Moderate to severe atherosclerosis. CT CERVICAL SPINE: 1. No acute fracture or malalignment. 2. Moderate RIGHT C4-5 neural foraminal narrowing. 3. Severe calcific atherosclerosis RIGHT carotid bifurcation can result in hemodynamically significant stenosis. Status post LEFT carotid endarterectomy.  Electronically Signed   By: Elon Alas M.D.   On: 04/09/2018 19:20   Ct Cervical Spine Wo Contrast  Result Date: 04/09/2018 CLINICAL DATA:  Head injury July 17th, subsequent weakness. History of AV fistula, aortic stent graft, atrial fibrillation, diabetes. EXAM: CT HEAD WITHOUT CONTRAST CT CERVICAL SPINE WITHOUT CONTRAST TECHNIQUE: Multidetector CT imaging of the head and cervical spine was performed following the standard protocol without intravenous contrast. Multiplanar CT image reconstructions of the cervical spine were also generated. COMPARISON:  MRI of the head August 31, 2006 FINDINGS: CT HEAD FINDINGS BRAIN: No intraparenchymal hemorrhage, mass effect nor midline shift. Borderline parenchymal brain volume loss. Patchy supratentorial white matter hypodensities within normal range for patient's age, though non-specific are most compatible with chronic small vessel ischemic disease. No acute large vascular territory infarcts. No abnormal extra-axial fluid collections. Basal cisterns are patent. VASCULAR: Moderate to severe calcific atherosclerosis of the carotid siphons and included vertebral arteries. SKULL: No skull fracture. No significant scalp soft tissue swelling. SINUSES/ORBITS: Trace paranasal sinus mucosal thickening. Mastoid air cells are well aerated.The included ocular globes and orbital contents are non-suspicious. OTHER: None. CT CERVICAL SPINE FINDINGS ALIGNMENT: Maintained lordosis. Vertebral bodies in alignment. SKULL BASE AND VERTEBRAE: Cervical vertebral bodies and posterior elements are intact. Moderate C4-5 and C5-6 disc height loss and endplate spurring compatible with degenerative discs. No destructive bony lesions. C1-2 articulation maintained. SOFT TISSUES AND SPINAL CANAL: Nonacute. Partially imaged subclavian artery stent severe calcific atherosclerosis RIGHT carotid bifurcation. Ectatic LEFT carotid bifurcation consistent with endarterectomy. DISC LEVELS: No significant  osseous canal stenosis. Moderate RIGHT C4-5, mild C5-6 neural foraminal narrowing. UPPER CHEST: Lung apices are clear. OTHER: None. IMPRESSION: CT HEAD:  1. No acute intracranial process. 2. Mild parenchymal brain volume loss. 3. Moderate to severe atherosclerosis. CT CERVICAL SPINE: 1. No acute fracture or malalignment. 2. Moderate RIGHT C4-5 neural foraminal narrowing. 3. Severe calcific atherosclerosis RIGHT carotid bifurcation can result in hemodynamically significant stenosis. Status post LEFT carotid endarterectomy. Electronically Signed   By: Elon Alas M.D.   On: 04/09/2018 19:20     Tye Savoy, NP-C @  04/11/2018, 2:50 PM

## 2018-04-11 NOTE — H&P (View-Only) (Signed)
Referring Provider: [facility]  Primary Care Physician:  Bernerd Limbo, MD Primary Gastroenterologist: Saw Dr. Dorrene German 6 years ago.  Reason for Consultation:   Anemia and maroon, heme positive stools.     ASSESSMENT AND PLAN:    67 yo female with multiple medical problems, significant vascular history on chronic Plavix. She had AAA repair in mid May with subsequent large RP hematoma and drop in hgb from 11 range to 9 range. Now admitted with with worsening anemia and maroon, heme positive stool. Hgb 7.3 on plavix. Sounds like she has chronic anemia describing iron infusions, chronic oral iron -for further evaluation patient will be scheduled for EGD, okay to do on plavix. Her stools are now light brown off iron and hgb stable after blood transfusion. The risks and benefits of EGD were discussed and the patient agrees to proceed.  -If no recurrent bleeding we can see her in office for follow up. We will get records from [facility] where she saw GI there ~ 5 years ago (? For anemia). Colonoscopy incomplete due to narrowing.  CKD, she has fistula but not yet on dialysis. CKD likely contributing to anemia.     Attending physician's note   I have taken an interval history, reviewed the chart and examined the patient. I agree with the Advanced Practitioner's note, impression and recommendations.   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  She had GI work-up approximately 5 years ago by Dr. Dorrene German -negative EGD, incomplete colonoscopy (due to sigmoid hypertrophy/angulation) followed by virtual colonoscopy. Plan: EGD in a.m. while on Plavix.  Trend CBC, if still with problems, repeat NCCT abdomen followed by colonoscopy preferably off Plavix (as outpatient).   Carmell Austria, MD    HPI: [name] is a 67 y.o. female with multiple medical  problems not limited to cholelithiasis, anxiety, chronic back pain, depression, CAD, DM,  s/p CABG in 2012, hx of perforated appendix in 2016, HTN, CKD V with AV fistula, AAA repair in May 2019 (Dr. Trula Slade) with subsequent large retroperitoneal hematoma. She is on chronic Plavix.   Patient presented to ED 2 days ago with complaints of weakness, dizziness.  In mid July she fell and struck her head. Head and cervical spine CT scan negative. In ED she was found to be anemic with heme positive, maroon stool on DRE in ED. Her vitals were stable. Hgb was 7.3, MCV 99, WBC 12.6. INR 1.24. She was transfused a unit of blood yesterday morning. Hgb rose to 9.3, has settled at 8.0 this am.   It sounds like [name] has a history of anemia, says she has received iron infusions in the past and also takes iron but can't remember for how long she has taken it. She endorses dark stools on iron but a few days prior to admission developed maroon colored stools. Currently not on oral iron and says stools are light brown. She is anemic at baseline. Prior to AAA repair in May her hgb was ~ 11.0. Following AAA repair she had a large retroperitoneal hematoma with decline in hgb to 9.5. Hgb checked mid July and it was 8.5.  She comes in this admission with hgb of 7.3, not a significant drop from form last CBC.   She is on Plavix, denies NSAIDs. She denies N/V but has been having pain in back going through  to the front under ribcage bilaterally. She recently lost a lot of weight. The large RP hematoma was pressing on stomach and causing discomfort.   [name] had an EGD and flex sig in April 2013, cannot remember why but possibly for bleeding. She describes incomplete colonoscopies. I found a report in Care Everywhere from Aprl 2013: Flex sig - unable to pass scope beyond 30 cm c/w history of some muscular hypertrophy in the area, probably from diverticulitis. Thee was mild sigmoid diverticulosis, internal hemorrhoids. 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 Sounds like she had a virtual colonoscopy afterwards but I can't find results.   Past Medical History:  Diagnosis Date  . AAA (abdominal aortic aneurysm) ([place]) 5/09   3.7x3.3 by u/s 2009  . Abnormal EKG    deep TW inversions chronic  . Anemia   . CAD (coronary artery disease)    a. CABG 03/2011 LIMA to LAD, SVG to OM, SVG to PDA. b. cath 06/25/2015 DES to SVG to rPDA, patent LIMA to LAD, patent SVG to OM  . carotid stenosis 5/09   S/p L CEA ;  60-79% bilat ICA by preCABG dopplers 7/12  . CHF (congestive heart failure) ([place])   . Chronic back pain    "all over" (06/24/2015)  . Chronic bronchitis ([place])    "get it pretty much q yr" (06/24/2015)  . CKD (chronic kidney disease)   . Complication of anesthesia 1966   "problem w/ether"  . COPD (chronic obstructive pulmonary disease) ([place])   . Coronary artery disease 5/09   s/p CABG 7/12   L-LAD, S-OM, S-PDA  . Depression   . GERD (gastroesophageal reflux disease)    With hiatal hernia  . GIB (gastrointestinal bleeding) 9/11   S/p EGD with cautery at [facility]  . Heart murmur   . History of blood transfusion "a few times"   "all related to anemia" (06/24/2015)  . History of hiatal hernia   . Hyperlipidemia   . Hypertension   . Mitral regurgitation    3+ MR by intraoperative TEE;  s/p MV repair with Dr. Roxy Manns 7/12  . Myocardial infarction [facility]) 2012 "several"  . On home oxygen therapy    "2 liters at night; negative for sleep apnea"  . PAD (peripheral artery disease) ([facility])    Severe; s/p bilateral renal artery stents, moderate in-stent restenosis  . Paroxysmal atrial fibrillation ([facility])    coumadin;  echo 9/07: EF 60%, mild LVH;  s/p Cox Maze 7/12 with LAA clipping  . Subclavian artery stenosis, left ([facility])    stented by Dr. Trula Slade on 10/18 to help flow of her LIMA to LAD  . Type II diabetes mellitus ([place])     Past Surgical History:  Procedure Laterality Date  . A/V FISTULAGRAM Left 03/21/2018   Procedure:  A/V FISTULAGRAM;  Surgeon: Serafina Mitchell, MD;  Location: [place] [facility];  Service: Cardiovascular;  Laterality: Left;  . ABDOMINAL AORTIC ENDOVASCULAR STENT GRAFT N/A 01/17/2018   Procedure: ABDOMINAL AORTIC ENDOVASCULAR STENT GRAFT WITH CO2;  Surgeon: Serafina Mitchell, MD;  Location: [place];  Service: Vascular;  Laterality: N/A;  . ABDOMINAL HYSTERECTOMY  1990's  . APPENDECTOMY  Aug. 11, 2016   Ruptured  . AV FISTULA PLACEMENT Left 11/03/2017   Procedure: ARTERIOVENOUS (AV) FISTULA CREATION LEFT ARM;  Surgeon: Serafina Mitchell, MD;  Location: [place];  Service: Vascular;  Laterality: Left;  . BOWEL RESECTION  2013  . CARDIAC CATHETERIZATION N/A 06/24/2015   Procedure:  Left Heart Cath and Cors/Grafts Angiography;  Surgeon: Leonie Man, MD;  Location: [place] [facility];  Service: Cardiovascular;  Laterality: N/A;  . CARDIAC CATHETERIZATION N/A 06/25/2015   Procedure: Coronary Stent Intervention;  Surgeon: Sherren Mocha, MD;  Location: [place] [facility];  Service: Cardiovascular;  Laterality: N/A;  . CAROTID ENDARTERECTOMY Left   . CATARACT EXTRACTION Right 03/27/2018  . CORONARY ANGIOPLASTY    . CORONARY ARTERY BYPASS GRAFT  03/05/2011   CABG X3 (LIMA to LAD, SVG to OM, SVG to PDA, EVH via left thigh  . CORONARY STENT PLACEMENT    . LACERATION REPAIR Right 1990's   WRIST  . MAZE  03/05/2011   complete biatrial lesion set with clipping of LA appendage  . MITRAL VALVE REPAIR  03/05/2011   41mm Memo 3D ring annuloplasty for ischemic MR  . PERIPHERAL VASCULAR BALLOON ANGIOPLASTY Left 03/21/2018   Procedure: PERIPHERAL VASCULAR BALLOON ANGIOPLASTY;  Surgeon: Serafina Mitchell, MD;  Location: [place] [facility];  Service: Cardiovascular;  Laterality: Left;  . PERIPHERAL VASCULAR CATHETERIZATION N/A 06/24/2015   Procedure: Aortic Arch Angiography;  Surgeon: Serafina Mitchell, MD;  Location: [place] [facility];  Service: Cardiovascular;  Laterality: N/A;  . PERIPHERAL VASCULAR  CATHETERIZATION  06/24/2015   Procedure: Peripheral Vascular Intervention;  Surgeon: Serafina Mitchell, MD;  Location: [place] [facility];  Service: Cardiovascular;;  . PERIPHERAL VASCULAR CATHETERIZATION N/A 06/24/2015   Procedure: Abdominal Aortogram;  Surgeon: Serafina Mitchell, MD;  Location: [place] [facility];  Service: Cardiovascular;  Laterality: N/A;  . RENAL ANGIOGRAM Bilateral 09/11/2013   Procedure: RENAL ANGIOGRAM;  Surgeon: Serafina Mitchell, MD;  Location: [facility];  Service: Cardiovascular;  Laterality: Bilateral;  . RIGHT FEMORAL-POPLITEAL BYPASS      Prior to Admission medications   Medication Sig Start Date End Date Taking? Authorizing Provider  albuterol (PROVENTIL HFA;VENTOLIN HFA) 108 (90 Base) MCG/ACT inhaler Inhale 2 puffs into the lungs every 4 (four) hours as needed for wheezing or shortness of breath. ProAir 10/08/15  Yes Delfina Redwood, MD  allopurinol (ZYLOPRIM) 100 MG tablet Take 100 mg by mouth daily. 02/02/17  Yes [provider]  ALPRAZolam Duanne Moron) 0.5 MG tablet Take 0.25 mg by mouth at bedtime as needed for anxiety.    Yes [provider]  amLODipine (NORVASC) 10 MG tablet Take 10 mg by mouth daily.     Yes [provider]  Artificial Tear Solution (SOOTHE XP OP) Apply 1-2 drops to eye 3 (three) times daily as needed (irration).   Yes [provider]  atorvastatin (LIPITOR) 40 MG tablet Take 40 mg by mouth every evening.  12/07/17  Yes [provider]  Besifloxacin HCl (BESIVANCE) 0.6 % SUSP Place 1 drop into the right eye 3 (three) times daily.   Yes [provider]  Bromfenac Sodium (PROLENSA) 0.07 % SOLN Place 1 drop into the right eye daily.   Yes [provider]  carvedilol (COREG) 25 MG tablet TAKE 1 TABLET BY MOUTH 2 TIMES A DAY WITH A MEAL Patient taking differently: TAKE 25 MG BY MOUTH 2 TIMES A DAY WITH A MEAL 11/04/15  Yes Truitt Merle C, NP  Cholecalciferol (VITAMIN D3) 5000 units TABS Take  5,000 Units by mouth daily.   Yes [provider]  cloNIDine (CATAPRES) 0.2 MG tablet Take 1 tablet (0.2 mg total) by mouth 3 (three) times daily. 07/10/15  Yes Richardson Dopp T, PA-C  clopidogrel (PLAVIX) 75 MG tablet  Take 1 tablet (75 mg total) by mouth daily with breakfast. 06/26/15  Yes Meng, [place], PA  Difluprednate (DUREZOL) 0.05 % EMUL Place 1 drop into the right eye daily.   Yes [provider]  docusate sodium (COLACE) 100 MG capsule Take 200 mg by mouth at bedtime as needed for mild constipation.   Yes [provider]  DULoxetine (CYMBALTA) 60 MG capsule Take one capsule (60 mg dose) by mouth daily. 10/26/17  Yes [provider]  famotidine (PEPCID) 20 MG tablet TAKE 1 TABLET (20 MG TOTAL) BY MOUTH DAILY BEFORE SUPPER. 01/27/16  Yes Sherren Mocha, MD  fenofibrate 160 MG tablet Take 1 tablet (160 mg total) by mouth daily. 10/08/15  Yes Delfina Redwood, MD  Ferrous Sulfate (IRON) 28 MG TABS Take 28 mg by mouth 2 (two) times daily.   Yes [provider]  furosemide (LASIX) 40 MG tablet Take 40 mg by mouth daily. May take an additional tablet day as needed for weight gain  05/30/17  Yes [provider]  gabapentin (NEURONTIN) 300 MG capsule Take 1 capsule (300 mg total) by mouth 2 (two) times daily. 03/06/18  Yes Serafina Mitchell, MD  hydrALAZINE (APRESOLINE) 100 MG tablet Take 100 mg by mouth 3 (three) times daily.   Yes [provider]  HYDROcodone-acetaminophen (NORCO/VICODIN) 5-325 MG tablet Take 1 tablet by mouth every 8 (eight) hours as needed for moderate pain. 11/03/17  Yes Rhyne, Hulen Shouts, PA-C  isosorbide mononitrate (IMDUR) 30 MG 24 hr tablet Take 0.5 tablets (15 mg total) by mouth daily. 12/02/15  Yes Sherren Mocha, MD  mirtazapine (REMERON) 7.5 MG tablet Take 7.5 mg by mouth at bedtime.  02/15/18 08/14/18 Yes [provider]  pantoprazole (PROTONIX) 40 MG tablet Take 1 tablet (40 mg total) by mouth daily. 06/26/15   Yes Almyra Deforest, PA  Potassium 99 MG TABS Take 99 mg by mouth daily.    Yes [provider]  zolpidem (AMBIEN) 10 MG tablet Take 10 mg by mouth at bedtime.   Yes [provider]  nitroGLYCERIN (NITRO-DUR) 0.2 mg/hr patch Place 1 patch (0.2 mg total) onto the skin daily. Remove old patch before applying new patch. Apply around the same time every day. Patient not taking: Reported on 04/09/2018 02/20/18   Serafina Mitchell, MD    Current Facility-Administered Medications  Medication Dose Route Frequency Provider Last Rate Last Dose  . allopurinol (ZYLOPRIM) tablet 100 mg  100 mg Oral Daily Vilma Prader, MD   100 mg at 04/11/18 1033  . amLODipine (NORVASC) tablet 10 mg  10 mg Oral Daily Vilma Prader, MD   10 mg at 04/11/18 1033  . atorvastatin (LIPITOR) tablet 40 mg  40 mg Oral QPM Vilma Prader, MD   40 mg at 04/10/18 1819  . carvedilol (COREG) tablet 12.5 mg  12.5 mg Oral BID WC Vilma Prader, MD   12.5 mg at 04/11/18 1033  . cholecalciferol (VITAMIN D) tablet 5,000 Units  5,000 Units Oral Daily Vilma Prader, MD   5,000 Units at 04/11/18 1033  . cloNIDine (CATAPRES) tablet 0.2 mg  0.2 mg Oral Q8H Vilma Prader, MD   0.2 mg at 04/11/18 1194  . Darbepoetin Alfa (ARANESP) injection 60 mcg  60 mcg Subcutaneous Once Vann, Jessica U, DO      . docusate sodium (COLACE) capsule 200 mg  200 mg Oral QHS PRN Vilma Prader, MD      . DULoxetine (  CYMBALTA) DR capsule 60 mg  60 mg Oral Daily Vilma Prader, MD   60 mg at 04/11/18 1033  . ferumoxytol (FERAHEME) 510 mg in sodium chloride 0.9 % 100 mL IVPB  510 mg Intravenous Once Vann, Jessica U, DO      . gabapentin (NEURONTIN) capsule 300 mg  300 mg Oral BID Vilma Prader, MD   300 mg at 04/11/18 1033  . heparin injection 5,000 Units  5,000 Units Subcutaneous Q8H Vilma Prader, MD   5,000 Units at 04/11/18 7322  . hydrALAZINE (APRESOLINE) tablet 100 mg  100 mg Oral Q8H Vilma Prader, MD   100  mg at 04/11/18 0254  . HYDROcodone-acetaminophen (NORCO/VICODIN) 5-325 MG per tablet 1 tablet  1 tablet Oral Q8H PRN Vilma Prader, MD   1 tablet at 04/11/18 1029  . hydroxypropyl methylcellulose / hypromellose (ISOPTO TEARS / GONIOVISC) 2.5 % ophthalmic solution 1 drop  1 drop Both Eyes TID PRN Vilma Prader, MD      . ipratropium-albuterol (DUONEB) 0.5-2.5 (3) MG/3ML nebulizer solution 3 mL  3 mL Nebulization Q4H PRN Vilma Prader, MD      . isosorbide mononitrate (IMDUR) 24 hr tablet 15 mg  15 mg Oral Daily Vilma Prader, MD   15 mg at 04/11/18 1033  . pantoprazole (PROTONIX) EC tablet 40 mg  40 mg Oral Daily Vilma Prader, MD   40 mg at 04/11/18 1033  . sodium chloride flush (NS) 0.9 % injection 3 mL  3 mL Intravenous Q12H Vilma Prader, MD   3 mL at 04/10/18 2132    Allergies as of 04/09/2018 - Review Complete 04/09/2018  Allergen Reaction Noted  . Isosorbide Other (See Comments) 10/28/2015  . Codeine Itching 10/28/2015  . Contrast media [iodinated diagnostic agents] Itching and Other (See Comments) 05/29/2012  . Ioxaglate Itching and Other (See Comments) 02/24/2015  . Metrizamide Itching and Other (See Comments) 07/03/2014  . Percocet [oxycodone-acetaminophen] Itching and Other (See Comments) 09/04/2013    Family History  Problem Relation Age of Onset  . Emphysema Mother   . Heart disease Mother        before age 52  . Hypertension Mother   . Hyperlipidemia Mother   . Heart attack Mother   . AAA (abdominal aortic aneurysm) Mother        rupture  . Heart attack Father 72  . Emphysema Father   . Heart disease Father        before age 46  . Hyperlipidemia Father   . Hypertension Father   . Peripheral vascular disease Father   . Diabetes Brother   . Heart disease Brother        before age 20  . Hyperlipidemia Brother   . Hypertension Brother   . Heart attack Brother        CABG  . Heart disease Other        Vascular disease in grandparents,  uncles and dad    Social History   Socioeconomic History  . Marital status: Widowed    Spouse name: Not on file  . Number of children: Not on file  . Years of education: Not on file  . Highest education level: Not on file  Occupational History  . Occupation: Scientist, water quality in past    Employer: DISABLED  Social Needs  . Financial resource strain: Not on file  . Food insecurity:    Worry: Not on file    Inability: Not  on file  . Transportation needs:    Medical: Not on file    Non-medical: Not on file  Tobacco Use  . Smoking status: Current Every Day Smoker    Years: 50.00    Types: Cigarettes  . Smokeless tobacco: Never Used  . Tobacco comment: 3/4 pk per day  Substance and Sexual Activity  . Alcohol use: Yes    Alcohol/week: 0.0 oz    Comment: occasionally  . Drug use: Yes    Types: Marijuana  . Sexual activity: Not on file  Lifestyle  . Physical activity:    Days per week: Not on file    Minutes per session: Not on file  . Stress: Not on file  Relationships  . Social connections:    Talks on phone: Not on file    Gets together: Not on file    Attends religious service: Not on file    Active member of club or organization: Not on file    Attends meetings of clubs or organizations: Not on file    Relationship status: Not on file  . Intimate partner violence:    Fear of current or ex partner: Not on file    Emotionally abused: Not on file    Physically abused: Not on file    Forced sexual activity: Not on file  Other Topics Concern  . Not on file  Social History Narrative   Widowed in 2009.    Review of Systems: All systems reviewed and negative except where noted in HPI.  Physical Exam: Vital signs in last 24 hours: Temp:  [98.4 F (36.9 C)-99.1 F (37.3 C)] 99 F (37.2 C) (08/06 0502) Pulse Rate:  [58-64] 62 (08/06 1029) Resp:  [16-17] 16 (08/06 0502) BP: (132-157)/(41-64) 150/49 (08/06 1029) SpO2:  [93 %-99 %] 93 % (08/06 1029) Last BM Date:  04/10/18 General:   Alert, chronically ill appearing female in NAD Psych:  Pleasant, cooperative. Normal mood and affect. Eyes:  Pupils equal, sclera clear, no icterus.   Conjunctiva pink. Ears:  Normal auditory acuity. Nose:  No deformity, discharge,  or lesions. Neck:  Supple; no masses Lungs:  Clear throughout to auscultation.   No wheezes, crackles, or rhonchi.  Heart:  Regular rate and rhythm; +murmur, no edema Abdomen:  Soft, non-distended, mild diffuse upper abdominal tenderness.  BS active, no palp mass    Rectal:  Deferred  Msk:  Symmetrical without gross deformities. . Neurologic:  Alert and  oriented x4;  grossly normal neurologically. Skin:  Intact without significant lesions or rashes.Scattered bruises.   Intake/Output from previous day: 08/05 0701 - 08/06 0700 In: 780 [P.O.:780] Out: -  Intake/Output this shift: No intake/output data recorded.  Lab Results: Recent Labs    04/09/18 1958 04/10/18 0500 04/11/18 0451  WBC 12.6* 15.1* 14.4*  HGB 7.3* 9.3* 8.0*  HCT 23.1* 29.3* 25.5*  PLT 256 306 269   BMET Recent Labs    04/09/18 1958 04/10/18 0500 04/11/18 0451  NA 132* 141 137  K 3.2* 3.9 4.3  CL 94* 100 98  CO2 26 28 26   GLUCOSE 166* 163* 164*  BUN 52* 46* 42*  CREATININE 3.03* 2.95* 2.78*  CALCIUM 7.7* 8.3* 8.0*   LFT No results for input(s): PROT, ALBUMIN, AST, ALT, ALKPHOS, BILITOT, BILIDIR, IBILI in the last 72 hours. PT/INR Recent Labs    04/09/18 2240  LABPROT 15.5*  INR 1.24   Studies/Results: Ct Head Wo Contrast  Result Date: 04/09/2018 CLINICAL DATA:  Head  injury July 17th, subsequent weakness. History of AV fistula, aortic stent graft, atrial fibrillation, diabetes. EXAM: CT HEAD WITHOUT CONTRAST CT CERVICAL SPINE WITHOUT CONTRAST TECHNIQUE: Multidetector CT imaging of the head and cervical spine was performed following the standard protocol without intravenous contrast. Multiplanar CT image reconstructions of the cervical spine were  also generated. COMPARISON:  MRI of the head August 31, 2006 FINDINGS: CT HEAD FINDINGS BRAIN: No intraparenchymal hemorrhage, mass effect nor midline shift. Borderline parenchymal brain volume loss. Patchy supratentorial white matter hypodensities within normal range for patient's age, though non-specific are most compatible with chronic small vessel ischemic disease. No acute large vascular territory infarcts. No abnormal extra-axial fluid collections. Basal cisterns are patent. VASCULAR: Moderate to severe calcific atherosclerosis of the carotid siphons and included vertebral arteries. SKULL: No skull fracture. No significant scalp soft tissue swelling. SINUSES/ORBITS: Trace paranasal sinus mucosal thickening. Mastoid air cells are well aerated.The included ocular globes and orbital contents are non-suspicious. OTHER: None. CT CERVICAL SPINE FINDINGS ALIGNMENT: Maintained lordosis. Vertebral bodies in alignment. SKULL BASE AND VERTEBRAE: Cervical vertebral bodies and posterior elements are intact. Moderate C4-5 and C5-6 disc height loss and endplate spurring compatible with degenerative discs. No destructive bony lesions. C1-2 articulation maintained. SOFT TISSUES AND SPINAL CANAL: Nonacute. Partially imaged subclavian artery stent severe calcific atherosclerosis RIGHT carotid bifurcation. Ectatic LEFT carotid bifurcation consistent with endarterectomy. DISC LEVELS: No significant osseous canal stenosis. Moderate RIGHT C4-5, mild C5-6 neural foraminal narrowing. UPPER CHEST: Lung apices are clear. OTHER: None. IMPRESSION: CT HEAD: 1. No acute intracranial process. 2. Mild parenchymal brain volume loss. 3. Moderate to severe atherosclerosis. CT CERVICAL SPINE: 1. No acute fracture or malalignment. 2. Moderate RIGHT C4-5 neural foraminal narrowing. 3. Severe calcific atherosclerosis RIGHT carotid bifurcation can result in hemodynamically significant stenosis. Status post LEFT carotid endarterectomy.  Electronically Signed   By: Elon Alas M.D.   On: 04/09/2018 19:20   Ct Cervical Spine Wo Contrast  Result Date: 04/09/2018 CLINICAL DATA:  Head injury July 17th, subsequent weakness. History of AV fistula, aortic stent graft, atrial fibrillation, diabetes. EXAM: CT HEAD WITHOUT CONTRAST CT CERVICAL SPINE WITHOUT CONTRAST TECHNIQUE: Multidetector CT imaging of the head and cervical spine was performed following the standard protocol without intravenous contrast. Multiplanar CT image reconstructions of the cervical spine were also generated. COMPARISON:  MRI of the head August 31, 2006 FINDINGS: CT HEAD FINDINGS BRAIN: No intraparenchymal hemorrhage, mass effect nor midline shift. Borderline parenchymal brain volume loss. Patchy supratentorial white matter hypodensities within normal range for patient's age, though non-specific are most compatible with chronic small vessel ischemic disease. No acute large vascular territory infarcts. No abnormal extra-axial fluid collections. Basal cisterns are patent. VASCULAR: Moderate to severe calcific atherosclerosis of the carotid siphons and included vertebral arteries. SKULL: No skull fracture. No significant scalp soft tissue swelling. SINUSES/ORBITS: Trace paranasal sinus mucosal thickening. Mastoid air cells are well aerated.The included ocular globes and orbital contents are non-suspicious. OTHER: None. CT CERVICAL SPINE FINDINGS ALIGNMENT: Maintained lordosis. Vertebral bodies in alignment. SKULL BASE AND VERTEBRAE: Cervical vertebral bodies and posterior elements are intact. Moderate C4-5 and C5-6 disc height loss and endplate spurring compatible with degenerative discs. No destructive bony lesions. C1-2 articulation maintained. SOFT TISSUES AND SPINAL CANAL: Nonacute. Partially imaged subclavian artery stent severe calcific atherosclerosis RIGHT carotid bifurcation. Ectatic LEFT carotid bifurcation consistent with endarterectomy. DISC LEVELS: No significant  osseous canal stenosis. Moderate RIGHT C4-5, mild C5-6 neural foraminal narrowing. UPPER CHEST: Lung apices are clear. OTHER: None. IMPRESSION: CT HEAD:  1. No acute intracranial process. 2. Mild parenchymal brain volume loss. 3. Moderate to severe atherosclerosis. CT CERVICAL SPINE: 1. No acute fracture or malalignment. 2. Moderate RIGHT C4-5 neural foraminal narrowing. 3. Severe calcific atherosclerosis RIGHT carotid bifurcation can result in hemodynamically significant stenosis. Status post LEFT carotid endarterectomy. Electronically Signed   By: Elon Alas M.D.   On: 04/09/2018 19:20     Tye Savoy, NP-C @  04/11/2018, 2:50 PM
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
## 2018-04-12 ENCOUNTER — Observation Stay (HOSPITAL_COMMUNITY): Payer: Medicare Other | Admitting: Certified Registered Nurse Anesthetist

## 2018-04-12 ENCOUNTER — Inpatient Hospital Stay (HOSPITAL_COMMUNITY): Payer: Medicare Other

## 2018-04-12 ENCOUNTER — Telehealth: Payer: Self-pay

## 2018-04-12 ENCOUNTER — Encounter (HOSPITAL_COMMUNITY)
Admission: EM | Disposition: A | Discharge status: Home Health | Payer: Self-pay | Source: Home / Self Care | Attending: Internal Medicine

## 2018-04-12 ENCOUNTER — Encounter (HOSPITAL_COMMUNITY): Payer: Self-pay

## 2018-04-12 DIAGNOSIS — F17210 Nicotine dependence, cigarettes, uncomplicated: Secondary | ICD-10-CM | POA: Diagnosis present

## 2018-04-12 DIAGNOSIS — I132 Hypertensive heart and chronic kidney disease with heart failure and with stage 5 chronic kidney disease, or end stage renal disease: Secondary | ICD-10-CM | POA: Diagnosis present

## 2018-04-12 DIAGNOSIS — F17200 Nicotine dependence, unspecified, uncomplicated: Secondary | ICD-10-CM | POA: Diagnosis not present

## 2018-04-12 DIAGNOSIS — E785 Hyperlipidemia, unspecified: Secondary | ICD-10-CM | POA: Diagnosis present

## 2018-04-12 DIAGNOSIS — I714 Abdominal aortic aneurysm, without rupture: Secondary | ICD-10-CM | POA: Diagnosis not present

## 2018-04-12 DIAGNOSIS — J449 Chronic obstructive pulmonary disease, unspecified: Secondary | ICD-10-CM | POA: Diagnosis present

## 2018-04-12 DIAGNOSIS — Z952 Presence of prosthetic heart valve: Secondary | ICD-10-CM | POA: Diagnosis not present

## 2018-04-12 DIAGNOSIS — M546 Pain in thoracic spine: Secondary | ICD-10-CM | POA: Diagnosis present

## 2018-04-12 DIAGNOSIS — D649 Anemia, unspecified: Secondary | ICD-10-CM | POA: Diagnosis not present

## 2018-04-12 DIAGNOSIS — K810 Acute cholecystitis: Secondary | ICD-10-CM | POA: Diagnosis not present

## 2018-04-12 DIAGNOSIS — Z8679 Personal history of other diseases of the circulatory system: Secondary | ICD-10-CM | POA: Diagnosis not present

## 2018-04-12 DIAGNOSIS — Z9981 Dependence on supplemental oxygen: Secondary | ICD-10-CM | POA: Diagnosis not present

## 2018-04-12 DIAGNOSIS — Z7902 Long term (current) use of antithrombotics/antiplatelets: Secondary | ICD-10-CM | POA: Diagnosis not present

## 2018-04-12 DIAGNOSIS — I2510 Atherosclerotic heart disease of native coronary artery without angina pectoris: Secondary | ICD-10-CM | POA: Diagnosis present

## 2018-04-12 DIAGNOSIS — K219 Gastro-esophageal reflux disease without esophagitis: Secondary | ICD-10-CM | POA: Diagnosis present

## 2018-04-12 DIAGNOSIS — M109 Gout, unspecified: Secondary | ICD-10-CM | POA: Diagnosis present

## 2018-04-12 DIAGNOSIS — Z951 Presence of aortocoronary bypass graft: Secondary | ICD-10-CM | POA: Diagnosis not present

## 2018-04-12 DIAGNOSIS — I5033 Acute on chronic diastolic (congestive) heart failure: Secondary | ICD-10-CM | POA: Diagnosis not present

## 2018-04-12 DIAGNOSIS — D62 Acute posthemorrhagic anemia: Secondary | ICD-10-CM | POA: Diagnosis not present

## 2018-04-12 DIAGNOSIS — K859 Acute pancreatitis without necrosis or infection, unspecified: Secondary | ICD-10-CM | POA: Diagnosis not present

## 2018-04-12 DIAGNOSIS — G8929 Other chronic pain: Secondary | ICD-10-CM | POA: Diagnosis present

## 2018-04-12 DIAGNOSIS — Z955 Presence of coronary angioplasty implant and graft: Secondary | ICD-10-CM | POA: Diagnosis not present

## 2018-04-12 DIAGNOSIS — K922 Gastrointestinal hemorrhage, unspecified: Secondary | ICD-10-CM | POA: Diagnosis not present

## 2018-04-12 DIAGNOSIS — D509 Iron deficiency anemia, unspecified: Secondary | ICD-10-CM | POA: Diagnosis present

## 2018-04-12 DIAGNOSIS — Z7901 Long term (current) use of anticoagulants: Secondary | ICD-10-CM | POA: Diagnosis not present

## 2018-04-12 DIAGNOSIS — I252 Old myocardial infarction: Secondary | ICD-10-CM | POA: Diagnosis not present

## 2018-04-12 DIAGNOSIS — E1151 Type 2 diabetes mellitus with diabetic peripheral angiopathy without gangrene: Secondary | ICD-10-CM | POA: Diagnosis present

## 2018-04-12 DIAGNOSIS — N184 Chronic kidney disease, stage 4 (severe): Secondary | ICD-10-CM | POA: Diagnosis not present

## 2018-04-12 DIAGNOSIS — K851 Biliary acute pancreatitis without necrosis or infection: Secondary | ICD-10-CM | POA: Diagnosis not present

## 2018-04-12 DIAGNOSIS — I480 Paroxysmal atrial fibrillation: Secondary | ICD-10-CM | POA: Diagnosis present

## 2018-04-12 DIAGNOSIS — K8000 Calculus of gallbladder with acute cholecystitis without obstruction: Secondary | ICD-10-CM | POA: Diagnosis present

## 2018-04-12 DIAGNOSIS — D500 Iron deficiency anemia secondary to blood loss (chronic): Secondary | ICD-10-CM | POA: Diagnosis present

## 2018-04-12 DIAGNOSIS — E1122 Type 2 diabetes mellitus with diabetic chronic kidney disease: Secondary | ICD-10-CM | POA: Diagnosis present

## 2018-04-12 DIAGNOSIS — N185 Chronic kidney disease, stage 5: Secondary | ICD-10-CM | POA: Diagnosis present

## 2018-04-12 HISTORY — PX: ESOPHAGOGASTRODUODENOSCOPY: SHX5428

## 2018-04-12 LAB — BASIC METABOLIC PANEL
ANION GAP: 12 (ref 5–15)
BUN: 39 mg/dL — ABNORMAL HIGH (ref 8–23)
CALCIUM: 7.8 mg/dL — AB (ref 8.9–10.3)
CO2: 26 mmol/L (ref 22–32)
Chloride: 98 mmol/L (ref 98–111)
Creatinine, Ser: 2.73 mg/dL — ABNORMAL HIGH (ref 0.44–1.00)
GFR calc non Af Amer: 17 mL/min — ABNORMAL LOW (ref >60)
GFR, EST AFRICAN AMERICAN: 20 mL/min — AB (ref >60)
GLUCOSE: 131 mg/dL — AB (ref 70–99)
POTASSIUM: 4.4 mmol/L (ref 3.5–5.1)
Sodium: 136 mmol/L (ref 135–145)

## 2018-04-12 LAB — CBC
HCT: 23.2 % — ABNORMAL LOW (ref 36.0–46.0)
Hemoglobin: 7.2 g/dL — ABNORMAL LOW (ref 12.0–15.0)
MCH: 29.1 pg (ref 26.0–34.0)
MCHC: 31 g/dL (ref 30.0–36.0)
MCV: 93.9 fL (ref 78.0–100.0)
PLATELETS: 241 10*3/uL (ref 150–400)
RBC: 2.47 MIL/uL — AB (ref 3.87–5.11)
RDW: 20.3 % — AB (ref 11.5–15.5)
WBC: 13 10*3/uL — AB (ref 4.0–10.5)

## 2018-04-12 LAB — HEPATIC FUNCTION PANEL
ALT: 6 U/L (ref 0–44)
AST: 12 U/L — ABNORMAL LOW (ref 15–41)
Albumin: 2.2 g/dL — ABNORMAL LOW (ref 3.5–5.0)
Alkaline Phosphatase: 27 U/L — ABNORMAL LOW (ref 38–126)
BILIRUBIN DIRECT: 0.5 mg/dL — AB (ref 0.0–0.2)
BILIRUBIN INDIRECT: 1.2 mg/dL — AB (ref 0.3–0.9)
TOTAL PROTEIN: 4.6 g/dL — AB (ref 6.5–8.1)
Total Bilirubin: 1.7 mg/dL — ABNORMAL HIGH (ref 0.3–1.2)

## 2018-04-12 LAB — PREPARE RBC (CROSSMATCH)

## 2018-04-12 LAB — HEMOGLOBIN AND HEMATOCRIT, BLOOD
HEMATOCRIT: 21.8 % — AB (ref 36.0–46.0)
Hemoglobin: 6.8 g/dL — CL (ref 12.0–15.0)

## 2018-04-12 SURGERY — EGD (ESOPHAGOGASTRODUODENOSCOPY)
Anesthesia: Monitor Anesthesia Care

## 2018-04-12 MED ORDER — LIDOCAINE 5 % EX PTCH
1.0000 | MEDICATED_PATCH | CUTANEOUS | Status: DC
  Administered 2018-04-12 – 2018-04-21 (×9): 1 via TRANSDERMAL
  Filled 2018-04-12 (×9): qty 1

## 2018-04-12 MED ORDER — PROPOFOL 500 MG/50ML IV EMUL
INTRAVENOUS | Status: DC | PRN
  Administered 2018-04-12: 100 ug/kg/min via INTRAVENOUS

## 2018-04-12 MED ORDER — SODIUM CHLORIDE 0.9 % IV SOLN: INTRAVENOUS | Status: DC

## 2018-04-12 MED ORDER — HYDRALAZINE HCL 20 MG/ML IJ SOLN: 5.0000 mg | Freq: Four times a day (QID) | INTRAMUSCULAR | Status: DC | PRN

## 2018-04-12 MED ORDER — LIDOCAINE HCL (CARDIAC) PF 100 MG/5ML IV SOSY
PREFILLED_SYRINGE | INTRAVENOUS | Status: DC | PRN
  Administered 2018-04-12: 60 mg via INTRAVENOUS

## 2018-04-12 MED ORDER — SODIUM CHLORIDE 0.9% IV SOLUTION: Freq: Once | INTRAVENOUS | Status: DC

## 2018-04-12 MED ORDER — PROPOFOL 10 MG/ML IV BOLUS
INTRAVENOUS | Status: DC | PRN
  Administered 2018-04-12 (×3): 10 mg via INTRAVENOUS

## 2018-04-12 NOTE — Consult Note (Signed)
Consult Note  Patient name: [name] MRN: [id number] DOB: 01-25-1951 Sex: female  Consulting Physician:  Hospital service  Reason for Consult:  Chief Complaint  Patient presents with  . Failure To Thrive    HISTORY OF PRESENT ILLNESS: [name] is a 67 y.o. female who I have been following for both chronic renal insufficiency as well as an abdominal aortic aneurysm.  We were trying to delay the treatment of her aneurysm until her renal disease progressed to dialysis.  She presented to the hospital in May 2019 with worsening abdominal pain and an increase in the size of her aneurysm.  Her aneurysm had increased from 4.8 cm up to 5.4 cm.  On 01/17/2018 she underwent endovascular repair.  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  In order to cross this lesion I had to snare it from the contralateral right side.  A VBX stent was placed across the lesion inside the aortic graft due to residual narrowing.  Patient's postoperative course was complicated by retroperitoneal hematoma likely the result from arterial injury when dilating her left common iliac artery.  A total of 7 cc of contrast were utilized for her repair.  The patient stayed in the hospital almost an additional week.  She has been wearing nitroglycerin patches to her left foot because of some discoloration not to be embolization from her surgery.  She still gets some pins and needle pain.  The discoloration is less   The patient was admitted on August 4 for generalized weakness.  She was found to be anemic.  She was transfused.  Her hemoglobin has been trending down.  She underwent upper endoscopy which is unremarkable.  I was consulted for possible arterial issues contributing to her anemia.  Past Medical History:  Diagnosis Date  . AAA (abdominal aortic aneurysm) ([place]) 5/09   3.7x3.3 by u/s 2009  . Abnormal EKG    deep TW inversions chronic  . Anemia   . CAD (coronary artery disease)      a. CABG 03/2011 LIMA to LAD, SVG to OM, SVG to PDA. b. cath 06/25/2015 DES to SVG to rPDA, patent LIMA to LAD, patent SVG to OM  . carotid stenosis 5/09   S/p L CEA ;  60-79% bilat ICA by preCABG dopplers 7/12  . CHF (congestive heart failure) ([place])   . Chronic back pain    "all over" (06/24/2015)  . Chronic bronchitis ([place])    "get it pretty much q yr" (06/24/2015)  . CKD (chronic kidney disease)   . Complication of anesthesia 1966   "problem w/ether"  . COPD (chronic obstructive pulmonary disease) ([place])   . Coronary artery disease 5/09   s/p CABG 7/12   L-LAD, S-OM, S-PDA  . Depression   . GERD (gastroesophageal reflux disease)    With hiatal hernia  . GIB (gastrointestinal bleeding) 9/11   S/p EGD with cautery at [facility]  . Heart murmur   . History of blood transfusion "a few times"   "all related to anemia" (06/24/2015)  . History of hiatal hernia   . Hyperlipidemia   . Hypertension   . Mitral regurgitation    3+ MR by intraoperative TEE;  s/p MV repair with Dr. Roxy Manns 7/12  . Myocardial infarction [facility]) 2012 "several"  . On home oxygen therapy    "2 liters at night; negative for sleep apnea"  . PAD (peripheral artery disease) ([place])  Severe; s/p bilateral renal artery stents, moderate in-stent restenosis  . Paroxysmal atrial fibrillation ([facility])    coumadin;  echo 9/07: EF 60%, mild LVH;  s/p Cox Maze 7/12 with LAA clipping  . Subclavian artery stenosis, left ([facility])    stented by Dr. Trula Slade on 10/18 to help flow of her LIMA to LAD  . Type II diabetes mellitus ([place])     Past Surgical History:  Procedure Laterality Date  . A/V FISTULAGRAM Left 03/21/2018   Procedure: A/V FISTULAGRAM;  Surgeon: Serafina Mitchell, MD;  Location: [facility];  Service: Cardiovascular;  Laterality: Left;  . ABDOMINAL AORTIC ENDOVASCULAR STENT GRAFT N/A 01/17/2018   Procedure: ABDOMINAL AORTIC ENDOVASCULAR STENT GRAFT WITH CO2;  Surgeon: Serafina Mitchell, MD;  Location: [place];  Service:  Vascular;  Laterality: N/A;  . ABDOMINAL HYSTERECTOMY  1990's  . APPENDECTOMY  Aug. 11, 2016   Ruptured  . AV FISTULA PLACEMENT Left 11/03/2017   Procedure: ARTERIOVENOUS (AV) FISTULA CREATION LEFT ARM;  Surgeon: Serafina Mitchell, MD;  Location: [facility];  Service: Vascular;  Laterality: Left;  . BOWEL RESECTION  2013  . CARDIAC CATHETERIZATION N/A 06/24/2015   Procedure: Left Heart Cath and Cors/Grafts Angiography;  Surgeon: Leonie Man, MD;  Location: [place] [facility];  Service: Cardiovascular;  Laterality: N/A;  . CARDIAC CATHETERIZATION N/A 06/25/2015   Procedure: Coronary Stent Intervention;  Surgeon: Sherren Mocha, MD;  Location: [place] [facility];  Service: Cardiovascular;  Laterality: N/A;  . CAROTID ENDARTERECTOMY Left   . CATARACT EXTRACTION Right 03/27/2018  . CORONARY ANGIOPLASTY    . CORONARY ARTERY BYPASS GRAFT  03/05/2011   CABG X3 (LIMA to LAD, SVG to OM, SVG to PDA, EVH via left thigh  . CORONARY STENT PLACEMENT    . LACERATION REPAIR Right 1990's   WRIST  . MAZE  03/05/2011   complete biatrial lesion set with clipping of LA appendage  . MITRAL VALVE REPAIR  03/05/2011   54mm Memo 3D ring annuloplasty for ischemic MR  . PERIPHERAL VASCULAR BALLOON ANGIOPLASTY Left 03/21/2018   Procedure: PERIPHERAL VASCULAR BALLOON ANGIOPLASTY;  Surgeon: Serafina Mitchell, MD;  Location: [facility];  Service: Cardiovascular;  Laterality: Left;  . PERIPHERAL VASCULAR CATHETERIZATION N/A 06/24/2015   Procedure: Aortic Arch Angiography;  Surgeon: Serafina Mitchell, MD;  Location: [place] [facility];  Service: Cardiovascular;  Laterality: N/A;  . PERIPHERAL VASCULAR CATHETERIZATION  06/24/2015   Procedure: Peripheral Vascular Intervention;  Surgeon: Serafina Mitchell, MD;  Location: [place] [facility];  Service: Cardiovascular;;  . PERIPHERAL VASCULAR CATHETERIZATION N/A 06/24/2015   Procedure: Abdominal Aortogram;  Surgeon: Serafina Mitchell, MD;  Location: [place] [facility];   Service: Cardiovascular;  Laterality: N/A;  . RENAL ANGIOGRAM Bilateral 09/11/2013   Procedure: RENAL ANGIOGRAM;  Surgeon: Serafina Mitchell, MD;  Location[facility];  Service: Cardiovascular;  Laterality: Bilateral;  . RIGHT FEMORAL-POPLITEAL BYPASS      Social History   Socioeconomic History  . Marital status: Widowed    Spouse name: Not on file  . Number of children: Not on file  . Years of education: Not on file  . Highest education level: Not on file  Occupational History  . Occupation: Scientist, water quality in past    Employer: DISABLED  Social Needs  . Financial resource strain: Not on file  . Food insecurity:    Worry: Not on file    Inability: Not on file  . Transportation needs:  Medical: Not on file    Non-medical: Not on file  Tobacco Use  . Smoking status: Current Every Day Smoker    Years: 50.00    Types: Cigarettes  . Smokeless tobacco: Never Used  . Tobacco comment: 3/4 pk per day  Substance and Sexual Activity  . Alcohol use: Yes    Alcohol/week: 0.0 oz    Comment: occasionally  . Drug use: Yes    Types: Marijuana  . Sexual activity: Not on file  Lifestyle  . Physical activity:    Days per week: Not on file    Minutes per session: Not on file  . Stress: Not on file  Relationships  . Social connections:    Talks on phone: Not on file    Gets together: Not on file    Attends religious service: Not on file    Active member of club or organization: Not on file    Attends meetings of clubs or organizations: Not on file    Relationship status: Not on file  . Intimate partner violence:    Fear of current or ex partner: Not on file    Emotionally abused: Not on file    Physically abused: Not on file    Forced sexual activity: Not on file  Other Topics Concern  . Not on file  Social History Narrative   Widowed in 2009.    Family History  Problem Relation Age of Onset  . Emphysema Mother   . Heart disease Mother        before age 73  . Hypertension Mother    . Hyperlipidemia Mother   . Heart attack Mother   . AAA (abdominal aortic aneurysm) Mother        rupture  . Heart attack Father 74  . Emphysema Father   . Heart disease Father        before age 48  . Hyperlipidemia Father   . Hypertension Father   . Peripheral vascular disease Father   . Diabetes Brother   . Heart disease Brother        before age 58  . Hyperlipidemia Brother   . Hypertension Brother   . Heart attack Brother        CABG  . Heart disease Other        Vascular disease in grandparents, uncles and dad    Allergies as of 04/09/2018 - Review Complete 04/09/2018  Allergen Reaction Noted  . Isosorbide Other (See Comments) 10/28/2015  . Codeine Itching 10/28/2015  . Contrast media [iodinated diagnostic agents] Itching and Other (See Comments) 05/29/2012  . Ioxaglate Itching and Other (See Comments) 02/24/2015  . Metrizamide Itching and Other (See Comments) 07/03/2014  . Percocet [oxycodone-acetaminophen] Itching and Other (See Comments) 09/04/2013    No current facility-administered medications on file prior to encounter.    Current Outpatient Medications on File Prior to Encounter  Medication Sig Dispense Refill  . albuterol (PROVENTIL HFA;VENTOLIN HFA) 108 (90 Base) MCG/ACT inhaler Inhale 2 puffs into the lungs every 4 (four) hours as needed for wheezing or shortness of breath. ProAir 1 each 0  . allopurinol (ZYLOPRIM) 100 MG tablet Take 100 mg by mouth daily.  2  . ALPRAZolam (XANAX) 0.5 MG tablet Take 0.25 mg by mouth at bedtime as needed for anxiety.     Marland Kitchen amLODipine (NORVASC) 10 MG tablet Take 10 mg by mouth daily.      . Artificial Tear Solution (SOOTHE XP OP) Apply 1-2 drops to  eye 3 (three) times daily as needed (irration).    Marland Kitchen atorvastatin (LIPITOR) 40 MG tablet Take 40 mg by mouth every evening.     Marland Kitchen Besifloxacin HCl (BESIVANCE) 0.6 % SUSP Place 1 drop into the right eye 3 (three) times daily.    . Bromfenac Sodium (PROLENSA) 0.07 % SOLN Place 1 drop  into the right eye daily.    . carvedilol (COREG) 25 MG tablet TAKE 1 TABLET BY MOUTH 2 TIMES A DAY WITH A MEAL (Patient taking differently: TAKE 25 MG BY MOUTH 2 TIMES A DAY WITH A MEAL) 60 tablet 9  . Cholecalciferol (VITAMIN D3) 5000 units TABS Take 5,000 Units by mouth daily.    . cloNIDine (CATAPRES) 0.2 MG tablet Take 1 tablet (0.2 mg total) by mouth 3 (three) times daily. 90 tablet 11  . clopidogrel (PLAVIX) 75 MG tablet Take 1 tablet (75 mg total) by mouth daily with breakfast. 90 tablet 3  . Difluprednate (DUREZOL) 0.05 % EMUL Place 1 drop into the right eye daily.    Marland Kitchen docusate sodium (COLACE) 100 MG capsule Take 200 mg by mouth at bedtime as needed for mild constipation.    . DULoxetine (CYMBALTA) 60 MG capsule Take one capsule (60 mg dose) by mouth daily.  1  . famotidine (PEPCID) 20 MG tablet TAKE 1 TABLET (20 MG TOTAL) BY MOUTH DAILY BEFORE SUPPER. 30 tablet 2  . fenofibrate 160 MG tablet Take 1 tablet (160 mg total) by mouth daily. 30 tablet 1  . Ferrous Sulfate (IRON) 28 MG TABS Take 28 mg by mouth 2 (two) times daily.    . furosemide (LASIX) 40 MG tablet Take 40 mg by mouth daily. May take an additional tablet day as needed for weight gain   6  . gabapentin (NEURONTIN) 300 MG capsule Take 1 capsule (300 mg total) by mouth 2 (two) times daily. 60 capsule 0  . hydrALAZINE (APRESOLINE) 100 MG tablet Take 100 mg by mouth 3 (three) times daily.    Marland Kitchen HYDROcodone-acetaminophen (NORCO/VICODIN) 5-325 MG tablet Take 1 tablet by mouth every 8 (eight) hours as needed for moderate pain. 8 tablet 0  . isosorbide mononitrate (IMDUR) 30 MG 24 hr tablet Take 0.5 tablets (15 mg total) by mouth daily. 45 tablet 3  . mirtazapine (REMERON) 7.5 MG tablet Take 7.5 mg by mouth at bedtime.     . pantoprazole (PROTONIX) 40 MG tablet Take 1 tablet (40 mg total) by mouth daily. 30 tablet 11  . Potassium 99 MG TABS Take 99 mg by mouth daily.     Marland Kitchen zolpidem (AMBIEN) 10 MG tablet Take 10 mg by mouth at bedtime.     . nitroGLYCERIN (NITRO-DUR) 0.2 mg/hr patch Place 1 patch (0.2 mg total) onto the skin daily. Remove old patch before applying new patch. Apply around the same time every day. (Patient not taking: Reported on 04/09/2018) 30 patch 12     REVIEW OF SYSTEMS: Cardiovascular: No chest pain, chest pressure, palpitations, orthopnea, or dyspnea on exertion. No claudication or rest pain,  No history of DVT or phlebitis. Pulmonary: No productive cough, asthma or wheezing. Neurologic: No weakness, paresthesias, aphasia, or amaurosis. No dizziness. Hematologic: No bleeding problems or clotting disorders. Musculoskeletal: No joint pain or joint swelling. Gastrointestinal: No blood in stool or hematemesis Genitourinary: No dysuria or hematuria. Psychiatric:: No history of major depression. Integumentary: No rashes or ulcers. Constitutional: No fever or chills.  PHYSICAL EXAMINATION: General: The patient appears their stated age.  Vital  signs are BP (!) 152/53 (BP Location: Right Wrist)   Pulse 62   Temp (!) 97.5 F (36.4 C) (Oral)   Resp 17   Ht 5\' 5"  (1.651 m)   Wt 133 lb 6.1 oz (60.5 kg)   SpO2 97%   BMI 22.20 kg/m  Pulmonary: Respirations are non-labored HEENT:  No gross abnormalities Abdomen: Tender to light palpation Musculoskeletal: There are no major deformities.   Neurologic: No focal weakness or paresthesias are detected, Skin: There are no ulcer or rashes noted. Psychiatric: The patient has normal affect. Cardiovascular: There is a regular rate and rhythm without significant murmur appreciated.  Palpable bilateral femoral pulses  Assessment:  #1 status post AAA repair, complicated by retroperitoneal hematoma #2 chronic renal insufficiency #3 anemia Plan: Unknown etiology of her anemia at this time.  Colonoscopy has not been performed but EGD was unremarkable.  The patient is on antiplatelet therapy for her cardiac issues.  I would consider discontinuing her Plavix.  We are  somewhat limited in diagnostic ability because of her renal issues and the inability to use contrast without compromising her renal function.  I would however go ahead and repeat a noncontrasted CT scan of the abdomen and pelvis to see if a retroperitoneal hematoma has enlarged.  Her anemia is likely multifactorial given her renal insufficiency. I think a aortoenteric fistula is NOT likely a reason for her symptoms.  I will follow-up with her after her CT scan has been performed.     Eldridge Abrahams, M.D. [facility] Office: [phone] Pager:  [phone]

## 2018-04-12 NOTE — Anesthesia Preprocedure Evaluation (Addendum)
Anesthesia Evaluation  Patient identified by MRN, date of birth, ID band Patient awake    Reviewed: Allergy & Precautions, NPO status , Patient's Chart, lab work & pertinent test results, reviewed documented beta blocker date and time   History of Anesthesia Complications Negative for: history of anesthetic complications  Airway Mallampati: II  TM Distance: >3 FB Neck ROM: Full    Dental  (+) Edentulous Upper, Edentulous Lower   Pulmonary sleep apnea and Oxygen sleep apnea , COPD,  COPD inhaler, Current Smoker,    breath sounds clear to auscultation       Cardiovascular hypertension, Pt. on home beta blockers and Pt. on medications + CAD, + Past MI, + Cardiac Stents, + CABG, + Peripheral Vascular Disease and +CHF  + dysrhythmias Atrial Fibrillation + Valvular Problems/Murmurs  Rhythm:Irregular Rate:Normal   S/p Left CEA  S/p MV repair  Left subclavian artery stenosis  '19 Carotid US - 40-59% b/l ICAS  '18 AAA [place] - There is evidence of abnormal dilitation of the Distal Abdominal aorta. The largest aortic measurement is 4.8 cm.  '18 TTE - EF 55% to 60%. Grade 3 diastolic dysfunction. S/P Mitral valve repair with 20m annuloplasty ring, consistent with moderate stenosis. Left atrium was mildly dilated. There was increased thickness of the atrial septum, consistent with lipomatous hypertrophy.    Neuro/Psych Depression negative neurological ROS  negative psych ROS   GI/Hepatic Neg liver ROS, hiatal hernia, GERD  Controlled and Medicated,  Endo/Other  diabetes  Renal/GU CRFRenal disease  negative genitourinary   Musculoskeletal negative musculoskeletal ROS (+)   Abdominal   Peds  Hematology negative hematology ROS (+) anemia ,   Anesthesia Other Findings   Reproductive/Obstetrics                            Anesthesia Physical Anesthesia Plan  ASA: III  Anesthesia Plan: MAC   Post-op  Pain Management:    Induction: Intravenous  PONV Risk Score and Plan: 2 and Propofol infusion and Treatment may vary due to age or medical condition  Airway Management Planned: Nasal Cannula and Natural Airway  Additional Equipment: None  Intra-op Plan:   Post-operative Plan:   Informed Consent: I have reviewed the patients History and Physical, chart, labs and discussed the procedure including the risks, benefits and alternatives for the proposed anesthesia with the patient or authorized representative who has indicated his/her understanding and acceptance.     Plan Discussed with: CRNA and Anesthesiologist  Anesthesia Plan Comments:         Anesthesia Quick Evaluation

## 2018-04-12 NOTE — Op Note (Signed)
[facility] Patient Name: [name] Procedure Date : 04/12/2018 MRN: [id number] Attending MD: [name] , MD Date of Birth: Dec 21, 1950 CSN: [id number] Age: 67 Admit Type: Inpatient Procedure:                Upper GI endoscopy Indications:              Unexplained iron deficiency anemia Providers:                [name], MD, Vista Lawman, RN, Alan Mulder,                            Technician Referring MD:              Medicines:                Monitored Anesthesia Care Complications:            No immediate complications. Estimated Blood Loss:     Estimated blood loss: none. Procedure:                Pre-Anesthesia Assessment:                           - Prior to the procedure, a History and Physical                            was performed, and patient medications and                            allergies were reviewed. The patient's tolerance of                            previous anesthesia was also reviewed. The risks                            and benefits of the procedure and the sedation                            options and risks were discussed with the patient.                            All questions were answered, and informed consent                            was obtained. Prior Anticoagulants: The patient has                            taken Plavix (clopidogrel), last dose was 1 day                            prior to procedure. ASA Grade Assessment: III - A                            patient with severe systemic disease. After  reviewing the risks and benefits, the patient was                            deemed in satisfactory condition to undergo the                            procedure.                           After obtaining informed consent, the endoscope was                            passed under direct vision. Throughout the                            procedure, the patient's blood pressure, pulse, and                      oxygen saturations were monitored continuously. The                            GIF-H190 ([id number]) Olympus Adult EGD was introduced                            through the mouth, and advanced to the second part                            of duodenum. The upper GI endoscopy was                            accomplished without difficulty. The patient                            tolerated the procedure well. Scope In: Scope Out: Findings:      The entire examined stomach was normal except for mild melanosis.      Diffuse moderate melanosis (likely due to iron intake)were found in the       first portion and second of the duodenum.      The exam was otherwise without abnormality. Impression:               - Normal stomach.                           - Mucosal changes in the stomach and duodenum.                           - The examination was otherwise normal.                           - No specimens collected. No active bleeding. Recommendation:           - Return patient to hospital ward for ongoing care.                           - Resume previous diet.                           -  Continue present medications. Procedure Code(s):        --- Professional ---                           [phone], Esophagogastroduodenoscopy, flexible,                            transoral; diagnostic, including collection of                            specimen(s) by brushing or washing, when performed                            (separate procedure) Diagnosis Code(s):        --- Professional ---                           K31.89, Other diseases of stomach and duodenum                           D50.9, Iron deficiency anemia, unspecified CPT copyright 2017 American Medical Association. All rights reserved. The codes documented in this report are preliminary and upon coder review may  be revised to meet current compliance requirements. [name], MD 04/12/2018 10:47:29 AM This report has been signed  electronically. Number of Addenda: 0

## 2018-04-12 NOTE — Transfer of Care (Signed)
Immediate Anesthesia Transfer of Care Note  Patient: [name]  Procedure(s) Performed: ESOPHAGOGASTRODUODENOSCOPY (EGD) (N/A )  Patient Location: Endoscopy Unit  Anesthesia Type:MAC  Level of Consciousness: awake, patient cooperative and responds to stimulation  Airway & Oxygen Therapy: Patient Spontanous Breathing and Patient connected to nasal cannula oxygen  Post-op Assessment: Report given to RN, Post -op Vital signs reviewed and stable and Patient moving all extremities X 4  Post vital signs: Reviewed and stable  Last Vitals:  Vitals Value Taken Time  BP 168/28 04/12/2018 10:34 AM  Temp    Pulse 54 04/12/2018 10:35 AM  Resp 18 04/12/2018 10:35 AM  SpO2 99 % 04/12/2018 10:35 AM  Vitals shown include unvalidated device data.  Last Pain:  Vitals:   04/12/18 0841  TempSrc: Oral  PainSc: 6          Complications: No apparent anesthesia complications

## 2018-04-12 NOTE — Addendum Note (Signed)
Addendum  created 04/12/18 1203 by Glynda Jaeger, CRNA   Charge Capture section accepted

## 2018-04-12 NOTE — Progress Notes (Signed)
CRITICAL VALUE ALERT  Critical Value:  6.8  Date & Time Notied:  04/12/18 @ 1410  Provider Notified: Eliseo Squires, MD  Orders Received/Actions taken:  New orders placed, will implement. Will continue to monitor patient.

## 2018-04-12 NOTE — Telephone Encounter (Signed)
-----   Message from Willia Craze, NP sent at 04/12/2018  3:29 PM EDT ----- Will put it in her hospital discharge papers. Thanks ! ----- Message ----- From: Debbora Lacrosse, RN Sent: 04/12/2018   3:28 PM To: Willia Craze, NP  3:00 pm.  Are you making the patient aware of this? ----- Message ----- From: Willia Craze, NP Sent: 04/12/2018   3:23 PM To: Debbora Lacrosse, RN  Ok 8/23 with me if fine. What time? Thanks ----- Message ----- From: Debbora Lacrosse, RN Sent: 04/12/2018   3:08 PM To: Willia Craze, NP  Dr [name] does not have an opening and at this time you only have availability on 04-28-18.  Will look again when the schedule is opened up.  I spoke with someone at Dr Rhoton's office and her last colon was 12-28-11, they are faxing a copy to the office. ----- Message ----- From: Willia Craze, NP Sent: 04/12/2018   1:58 PM To: Willia Craze, NP, [name], MD, #  Olin Hauser,  Please see if Dr. [name] has an opening for a hospital follow up to see her sometime within within the next 3-4 weeks. If not then I am happy to see her. Please see in meantime if you can locate the results of colonoscopies / virtual colonoscopy done a few years back by Dr. Dorrene German and get them scanned into Epic. Thanks
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
## 2018-04-12 NOTE — Anesthesia Postprocedure Evaluation (Signed)
Anesthesia Post Note  Patient: [name]  Procedure(s) Performed: ESOPHAGOGASTRODUODENOSCOPY (EGD) (N/A )     Patient location during evaluation: PACU Anesthesia Type: MAC Level of consciousness: awake and alert Pain management: pain level controlled Vital Signs Assessment: post-procedure vital signs reviewed and stable Respiratory status: spontaneous breathing, nonlabored ventilation and respiratory function stable Cardiovascular status: stable and blood pressure returned to baseline Anesthetic complications: no    Last Vitals:  Vitals:   04/12/18 1045 04/12/18 1055  BP: (!) 159/37 (!) 170/35  Pulse: (!) 56 (!) 56  Resp: 12 15  Temp:    SpO2: 91% 93%    Last Pain:  Vitals:   04/12/18 1055  TempSrc:   PainSc: 0-No pain                 Audry Pili

## 2018-04-12 NOTE — Progress Notes (Signed)
PT Cancellation Note  Patient Details Name: [name] MRN: [id number] DOB: 07-Sep-1950   Cancelled Treatment:    Reason Eval/Treat Not Completed: (P) Medical issues which prohibited therapy(Pt hgb 6.8, will defer tx until hgb improves to > 7.  )   Suda Forbess J Stann Mainland 04/12/2018, 5:07 PM  Governor Rooks, PTA pager [phone]

## 2018-04-12 NOTE — Progress Notes (Addendum)
  Made hospital follow up with me on 8/23 at 3pm. We have already requested GI records from Dr. Dorrene German so decision can be made as to whether repeat colonoscopy is necessary. It may not even be possible based on what patient tells me (incomplete exams in past due to colon anatomy). We are also getting her last virtual colonoscopy report

## 2018-04-12 NOTE — Interval H&P Note (Signed)
History and Physical Interval Note:  04/12/2018 10:00 AM  [name]  has presented today for surgery, with the diagnosis of egd  The various methods of treatment have been discussed with the patient and family. After consideration of risks, benefits and other options for treatment, the patient has consented to  Procedure(s): ESOPHAGOGASTRODUODENOSCOPY (EGD) (N/A) as a surgical intervention .  The patient's history has been reviewed, patient examined, no change in status, stable for surgery.  I have reviewed the patient's chart and labs.  Questions were answered to the patient's satisfaction.     [name]

## 2018-04-13 ENCOUNTER — Inpatient Hospital Stay (HOSPITAL_COMMUNITY): Payer: Medicare Other

## 2018-04-13 DIAGNOSIS — K859 Acute pancreatitis without necrosis or infection, unspecified: Secondary | ICD-10-CM

## 2018-04-13 LAB — BASIC METABOLIC PANEL
Anion gap: 8 (ref 5–15)
BUN: 35 mg/dL — AB (ref 8–23)
CO2: 27 mmol/L (ref 22–32)
Calcium: 8.1 mg/dL — ABNORMAL LOW (ref 8.9–10.3)
Chloride: 102 mmol/L (ref 98–111)
Creatinine, Ser: 2.65 mg/dL — ABNORMAL HIGH (ref 0.44–1.00)
GFR calc Af Amer: 21 mL/min — ABNORMAL LOW (ref >60)
GFR, EST NON AFRICAN AMERICAN: 18 mL/min — AB (ref >60)
GLUCOSE: 127 mg/dL — AB (ref 70–99)
POTASSIUM: 4.5 mmol/L (ref 3.5–5.1)
Sodium: 137 mmol/L (ref 135–145)

## 2018-04-13 LAB — CBC
HEMATOCRIT: 25.7 % — AB (ref 36.0–46.0)
Hemoglobin: 8 g/dL — ABNORMAL LOW (ref 12.0–15.0)
MCH: 29.3 pg (ref 26.0–34.0)
MCHC: 31.1 g/dL (ref 30.0–36.0)
MCV: 94.1 fL (ref 78.0–100.0)
PLATELETS: 222 10*3/uL (ref 150–400)
RBC: 2.73 MIL/uL — ABNORMAL LOW (ref 3.87–5.11)
RDW: 19.5 % — AB (ref 11.5–15.5)
WBC: 10.9 10*3/uL — ABNORMAL HIGH (ref 4.0–10.5)

## 2018-04-13 LAB — TYPE AND SCREEN
ABO/RH(D): B POS
ANTIBODY SCREEN: NEGATIVE
UNIT DIVISION: 0
UNIT DIVISION: 0

## 2018-04-13 LAB — BPAM RBC
BLOOD PRODUCT EXPIRATION DATE: 201908292359
Blood Product Expiration Date: 201908292359
ISSUE DATE / TIME: 201908042342
ISSUE DATE / TIME: 201908071954
UNIT TYPE AND RH: 7300
Unit Type and Rh: 7300

## 2018-04-13 LAB — LIPASE, BLOOD: LIPASE: 1533 U/L — AB (ref 11–51)

## 2018-04-13 MED ORDER — FUROSEMIDE 40 MG PO TABS
40.0000 mg | ORAL_TABLET | Freq: Every day | ORAL | Status: DC
  Administered 2018-04-13: 40 mg via ORAL
  Filled 2018-04-13: qty 1

## 2018-04-13 MED ORDER — HYDROMORPHONE HCL 1 MG/ML IJ SOLN
0.5000 mg | INTRAMUSCULAR | Status: DC | PRN
  Administered 2018-04-13 – 2018-04-20 (×25): 0.5 mg via INTRAVENOUS
  Filled 2018-04-13 (×27): qty 1

## 2018-04-13 MED ORDER — SORBITOL 70 % SOLN
5.0000 mL | Freq: Every day | Status: DC | PRN
  Filled 2018-04-13: qty 30

## 2018-04-13 NOTE — Progress Notes (Signed)
Radiologist called results of RUQ [place] from today. Showed possible gangrenous GB with fluid and stones. Pt localizes pain to the RUQ. No fever. Pt dx'd with pancreatitis per CT this hospitalization. NP spoke to [facility] MD from general surgery. We discussed pt's medical hx and Dr. Barry Dienes said there was nothing urgent to be done tonight and surgery will see pt first thing in the am. [facility], NP [facility]
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
## 2018-04-13 NOTE — Progress Notes (Signed)
0100 paged Dr. Baltazar Najjar that new Hgb level was 8 s/p 1 unit RBC.

## 2018-04-13 NOTE — Progress Notes (Signed)
Physical Therapy Treatment Patient Details Name: [name] MRN: [id number] DOB: 07-09-1951 Today's Date: 04/13/2018    History of Present Illness 67 year old woman with medical problems including chronic kidney disease stage 5, CAD status post CABG in 2012, repair of abdominal aortic aneurysm in May 2019, COPD, nocturnal use of oxygen, chronic lumbar back pain, and hypertension presenting with weakness.    PT Comments    Pt performed gait training with device which she is currently relying on for mobility.  Plan for continued progression in efforts to return to ambulation without device.  Pt remains debilitated from hospitalization.  Will also plan for HEP to tolerance as patient is able.  HHPT remains appropriate for continued mobility at d/c.      Follow Up Recommendations  Home health PT     Equipment Recommendations  None recommended by PT    Recommendations for Other Services       Precautions / Restrictions Precautions Precautions: Fall Restrictions Weight Bearing Restrictions: No    Mobility  Bed Mobility Overal bed mobility: Needs Assistance Bed Mobility: Sit to Supine       Sit to supine: Supervision   General bed mobility comments: Supervision for technique and position  Transfers Overall transfer level: Needs assistance Equipment used: Rolling walker (2 wheeled) Transfers: Sit to/from Stand Sit to Stand: Supervision         General transfer comment: Unassisted cues for safety  Ambulation/Gait Ambulation/Gait assistance: Supervision Gait Distance (Feet): 200 Feet Assistive device: Rolling walker (2 wheeled) Gait Pattern/deviations: Step-through pattern;Trunk flexed     General Gait Details: Cues for obstacle negotiation, require rest break x1 due to fatigue in arms and pain in B calves.     Stairs             Wheelchair Mobility    Modified Rankin (Stroke Patients Only)       Balance Overall balance assessment: Needs  assistance   Sitting balance-Leahy Scale: Normal       Standing balance-Leahy Scale: Fair Standing balance comment: heavy reliance of UEs.                              Cognition Arousal/Alertness: Awake/alert Behavior During Therapy: WFL for tasks assessed/performed Overall Cognitive Status: Within Functional Limits for tasks assessed                                        Exercises      General Comments        Pertinent Vitals/Pain Pain Assessment: 0-10 Pain Score: 7  Pain Location: B calves Pain Descriptors / Indicators: Spasm;Cramping Pain Intervention(s): Monitored during session;Repositioned    Home Living                      Prior Function            PT Goals (current goals can now be found in the care plan section) Acute Rehab PT Goals Patient Stated Goal: To get back to bed and take a nap Potential to Achieve Goals: Good Progress towards PT goals: Progressing toward goals    Frequency    Min 3X/week      PT Plan Current plan remains appropriate    Co-evaluation              AM-PAC PT "6 Clicks"  Daily Activity  Outcome Measure  Difficulty turning over in bed (including adjusting bedclothes, sheets and blankets)?: A Little Difficulty moving from lying on back to sitting on the side of the bed? : A Little Difficulty sitting down on and standing up from a chair with arms (e.g., wheelchair, bedside commode, etc,.)?: A Little Help needed moving to and from a bed to chair (including a wheelchair)?: A Little Help needed walking in hospital room?: A Little Help needed climbing 3-5 steps with a railing? : A Little 6 Click Score: 18    End of Session Equipment Utilized During Treatment: Gait belt Activity Tolerance: Patient tolerated treatment well Patient left: in bed;with call bell/phone within reach Nurse Communication: Mobility status PT Visit Diagnosis: Unsteadiness on feet (R26.81);Difficulty in  walking, not elsewhere classified (R26.2);Muscle weakness (generalized) (M62.81)     Time: 0037-0488 PT Time Calculation (min) (ACUTE ONLY): 13 min  Charges:  $Gait Training: 8-22 mins                     Governor Rooks, PTA pager [place] 04/13/2018, 1:55 PM

## 2018-04-14 DIAGNOSIS — D500 Iron deficiency anemia secondary to blood loss (chronic): Secondary | ICD-10-CM | POA: Diagnosis present

## 2018-04-14 DIAGNOSIS — K922 Gastrointestinal hemorrhage, unspecified: Secondary | ICD-10-CM

## 2018-04-14 DIAGNOSIS — K810 Acute cholecystitis: Secondary | ICD-10-CM | POA: Clinically undetermined

## 2018-04-14 DIAGNOSIS — K851 Biliary acute pancreatitis without necrosis or infection: Secondary | ICD-10-CM | POA: Clinically undetermined

## 2018-04-14 DIAGNOSIS — I2510 Atherosclerotic heart disease of native coronary artery without angina pectoris: Secondary | ICD-10-CM

## 2018-04-14 LAB — CBC
HCT: 27 % — ABNORMAL LOW (ref 36.0–46.0)
Hemoglobin: 8.3 g/dL — ABNORMAL LOW (ref 12.0–15.0)
MCH: 29.2 pg (ref 26.0–34.0)
MCHC: 30.7 g/dL (ref 30.0–36.0)
MCV: 95.1 fL (ref 78.0–100.0)
PLATELETS: 220 10*3/uL (ref 150–400)
RBC: 2.84 MIL/uL — AB (ref 3.87–5.11)
RDW: 19.5 % — ABNORMAL HIGH (ref 11.5–15.5)
WBC: 8 10*3/uL (ref 4.0–10.5)

## 2018-04-14 LAB — LIPASE, BLOOD: LIPASE: 197 U/L — AB (ref 11–51)

## 2018-04-14 LAB — COMPREHENSIVE METABOLIC PANEL
ALBUMIN: 2.3 g/dL — AB (ref 3.5–5.0)
ALT: 7 U/L (ref 0–44)
AST: 14 U/L — ABNORMAL LOW (ref 15–41)
Alkaline Phosphatase: 34 U/L — ABNORMAL LOW (ref 38–126)
Anion gap: 12 (ref 5–15)
BUN: 33 mg/dL — ABNORMAL HIGH (ref 8–23)
CHLORIDE: 102 mmol/L (ref 98–111)
CO2: 25 mmol/L (ref 22–32)
Calcium: 8.5 mg/dL — ABNORMAL LOW (ref 8.9–10.3)
Creatinine, Ser: 2.64 mg/dL — ABNORMAL HIGH (ref 0.44–1.00)
GFR calc Af Amer: 21 mL/min — ABNORMAL LOW (ref >60)
GFR calc non Af Amer: 18 mL/min — ABNORMAL LOW (ref >60)
GLUCOSE: 104 mg/dL — AB (ref 70–99)
POTASSIUM: 4.6 mmol/L (ref 3.5–5.1)
SODIUM: 139 mmol/L (ref 135–145)
Total Bilirubin: 1.3 mg/dL — ABNORMAL HIGH (ref 0.3–1.2)
Total Protein: 5 g/dL — ABNORMAL LOW (ref 6.5–8.1)

## 2018-04-14 MED ORDER — SODIUM CHLORIDE 0.9 % IV SOLN
INTRAVENOUS | Status: AC
  Administered 2018-04-14 – 2018-04-15 (×3): via INTRAVENOUS

## 2018-04-14 MED ORDER — SODIUM CHLORIDE 0.9 % IV SOLN
2.0000 g | INTRAVENOUS | Status: DC
  Administered 2018-04-14 – 2018-04-19 (×6): 2 g via INTRAVENOUS
  Filled 2018-04-14 (×8): qty 20

## 2018-04-14 MED ORDER — NICOTINE 21 MG/24HR TD PT24
21.0000 mg | MEDICATED_PATCH | Freq: Every day | TRANSDERMAL | Status: DC
  Administered 2018-04-14 – 2018-04-21 (×7): 21 mg via TRANSDERMAL
  Filled 2018-04-14 (×7): qty 1

## 2018-04-14 NOTE — H&P (Addendum)
[facility] Surgery Consult Note  [name] [facility] [birth year]  [id number].    Requesting MD: Grandville Silos, MD Chief Complaint/Reason for Consult: gallstone pancreatitis  HPI:  [name] is a 67 y/o female with MMP including, but not limited to, CKD, CAD s/p CABGx3 2012, AAA repair 2019, COPD, anemia, chronic back pain, and CAD who presented to [facility] 04/09/18 with a cc weakness and maroon stools. When I ask the patient about her symptoms she states they started in May around the time of her AA repair - she had post-op hematoma causing mostly left-sided abdominal pain. She states her pain changed in June when it moved to her RUQ with some radiation around to her back. She states the RUQ pain was sharp and intermittent, unsure if it is associated with meals. She also describes a number of recent procedures/events in the past couple of months including placement of AV fisula in RUE, cataract surgery, a fall in her driveway in July where she hit her head, and a prednisone taper 2 weeks ago for SOB. Patient states her last colonoscopy was about 7-8 years ago in [facility] by Dr. Dorrene German and he was unable to pass a pediatric scope to do an adequate exam so she had a virtual colonoscopy, followed by partial colectomy for what her daughter reports was diverticulitis. The patient denies a personal or family history of colon cancer. She endorses 20 lbs of weight loss and poor appetite since her AAA repair. She states her last BM was less than 24 hours ago and was watery/non-bloody. At baseline the patient endorses constipation, having BMs every 3-4 days and treating with prune juice + coffee as needed.  The patient is currently on nocturnal oxygen. She is on plavix due to cardiac history (last taken 3 days ago). She currently smokes "a little less" than a pack of cigarettes daily. She reports smoking marijuana every evening up until about two weeks ago. She reports drinking alcohol about once every 3 months. Past abdominal  surgeries include partial colectomy, abdominal hysterectomy, and open appendectomy (2016, perforated appendicitis).  Workup was significant for anemia (hgb 7.3) and FOBT positive stool. She was transfused due to symptomatic iron-deficiency anemia and admitted for further management. She underwent EGD by Dr. [name] 8/7 which was overall normal with no active bleeding. Her anemia continued to trend down, vascular surgery was consulted and recommended repeat CT abd. This showed pancreatitis and general surgery has been asked to consult.   ROS: Review of Systems  Constitutional: Positive for malaise/fatigue. Negative for chills and fever.  Cardiovascular: Negative.   Gastrointestinal: Positive for abdominal pain and constipation. Negative for diarrhea, nausea and vomiting.  Genitourinary: Negative.   All other systems reviewed and are negative.   Family History  Problem Relation Age of Onset  . Emphysema Mother   . Heart disease Mother        before age 15  . Hypertension Mother   . Hyperlipidemia Mother   . Heart attack Mother   . AAA (abdominal aortic aneurysm) Mother        rupture  . Heart attack Father 67  . Emphysema Father   . Heart disease Father        before age 21  . Hyperlipidemia Father   . Hypertension Father   . Peripheral vascular disease Father   . Diabetes Brother   . Heart disease Brother        before age 84  . Hyperlipidemia Brother   . Hypertension Brother   .  Heart attack Brother        CABG  . Heart disease Other        Vascular disease in grandparents, uncles and dad    Past Medical History:  Diagnosis Date  . AAA (abdominal aortic aneurysm) ([place]) 5/09   3.7x3.3 by u/s 2009  . Abnormal EKG    deep TW inversions chronic  . Anemia   . CAD (coronary artery disease)    a. CABG 03/2011 LIMA to LAD, SVG to OM, SVG to PDA. b. cath 06/25/2015 DES to SVG to rPDA, patent LIMA to LAD, patent SVG to OM  . carotid stenosis 5/09   S/p L CEA ;  60-79% bilat ICA  by preCABG dopplers 7/12  . CHF (congestive heart failure) ([place])   . Chronic back pain    "all over" (06/24/2015)  . Chronic bronchitis ([place])    "get it pretty much q yr" (06/24/2015)  . CKD (chronic kidney disease)   . Complication of anesthesia 1966   "problem w/ether"  . COPD (chronic obstructive pulmonary disease) ([place])   . Coronary artery disease 5/09   s/p CABG 7/12   L-LAD, S-OM, S-PDA  . Depression   . GERD (gastroesophageal reflux disease)    With hiatal hernia  . GIB (gastrointestinal bleeding) 9/11   S/p EGD with cautery at [facility]  . Heart murmur   . History of blood transfusion "a few times"   "all related to anemia" (06/24/2015)  . History of hiatal hernia   . Hyperlipidemia   . Hypertension   . Mitral regurgitation    3+ MR by intraoperative TEE;  s/p MV repair with Dr. Roxy Manns 7/12  . Myocardial infarction [facility]) 2012 "several"  . On home oxygen therapy    "2 liters at night; negative for sleep apnea"  . PAD (peripheral artery disease) (HCC)    Severe; s/p bilateral renal artery stents, moderate in-stent restenosis  . Paroxysmal atrial fibrillation (HCC)    coumadin;  echo 9/07: EF 60%, mild LVH;  s/p Cox Maze 7/12 with LAA clipping  . Subclavian artery stenosis, left (HCC)    stented by Dr. Trula Slade on 10/18 to help flow of her LIMA to LAD  . Type II diabetes mellitus ([place])     Past Surgical History:  Procedure Laterality Date  . A/V FISTULAGRAM Left 03/21/2018   Procedure: A/V FISTULAGRAM;  Surgeon: Serafina Mitchell, MD;  Location: [place] [facility];  Service: Cardiovascular;  Laterality: Left;  . ABDOMINAL AORTIC ENDOVASCULAR STENT GRAFT N/A 01/17/2018   Procedure: ABDOMINAL AORTIC ENDOVASCULAR STENT GRAFT WITH CO2;  Surgeon: Serafina Mitchell, MD;  Location: [place];  Service: Vascular;  Laterality: N/A;  . ABDOMINAL HYSTERECTOMY  1990's  . APPENDECTOMY  Aug. 11, 2016   Ruptured  . AV FISTULA PLACEMENT Left 11/03/2017   Procedure: ARTERIOVENOUS (AV) FISTULA  CREATION LEFT ARM;  Surgeon: Serafina Mitchell, MD;  Location: [place];  Service: Vascular;  Laterality: Left;  . BOWEL RESECTION  2013  . CARDIAC CATHETERIZATION N/A 06/24/2015   Procedure: Left Heart Cath and Cors/Grafts Angiography;  Surgeon: Leonie Man, MD;  Location: [place] [facility];  Service: Cardiovascular;  Laterality: N/A;  . CARDIAC CATHETERIZATION N/A 06/25/2015   Procedure: Coronary Stent Intervention;  Surgeon: Sherren Mocha, MD;  Location: [place] [facility];  Service: Cardiovascular;  Laterality: N/A;  . CAROTID ENDARTERECTOMY Left   . CATARACT EXTRACTION Right 03/27/2018  . CORONARY ANGIOPLASTY    . CORONARY ARTERY  BYPASS GRAFT  03/05/2011   CABG X3 (LIMA to LAD, SVG to OM, SVG to PDA, EVH via left thigh  . CORONARY STENT PLACEMENT    . ESOPHAGOGASTRODUODENOSCOPY N/A 04/12/2018   Procedure: ESOPHAGOGASTRODUODENOSCOPY (EGD);  Surgeon: [name], MD;  Location: [facility] ENDOSCOPY;  Service: Endoscopy;  Laterality: N/A;  . LACERATION REPAIR Right 1990's   WRIST  . MAZE  03/05/2011   complete biatrial lesion set with clipping of LA appendage  . MITRAL VALVE REPAIR  03/05/2011   14m Memo 3D ring annuloplasty for ischemic MR  . PERIPHERAL VASCULAR BALLOON ANGIOPLASTY Left 03/21/2018   Procedure: PERIPHERAL VASCULAR BALLOON ANGIOPLASTY;  Surgeon: BSerafina Mitchell MD;  [place] [place];  Service: Cardiovascular;  Laterality: Left;  . PERIPHERAL VASCULAR CATHETERIZATION N/A 06/24/2015   Procedure: Aortic Arch Angiography;  Surgeon: VSerafina Mitchell MD;  [facility];  Service: Cardiovascular;  Laterality: N/A;  . PERIPHERAL VASCULAR CATHETERIZATION  06/24/2015   Procedure: Peripheral Vascular Intervention;  Surgeon: VSerafina Mitchell MD;  [place];  Service: Cardiovascular;;  . PERIPHERAL VASCULAR CATHETERIZATION N/A 06/24/2015   Procedure: Abdominal Aortogram;  Surgeon: VSerafina Mitchell MD;  [place];  Service:  Cardiovascular;  Laterality: N/A;  . RENAL ANGIOGRAM Bilateral 09/11/2013   Procedure: RENAL ANGIOGRAM;  Surgeon: VSerafina Mitchell MD;  [facility];  Service: Cardiovascular;  Laterality: Bilateral;  . RIGHT FEMORAL-POPLITEAL BYPASS      Social History:  reports that she has been smoking cigarettes. She has smoked for the past 50.00 years. She has never used smokeless tobacco. She reports that she drinks alcohol. She reports that she has current or past drug history. Drug: Marijuana.  Allergies:  Allergies  Allergen Reactions  . Isosorbide Other (See Comments)    UNSPECIFIED REACTION - okay in low doses  . Codeine Itching  . Contrast Media [Iodinated Diagnostic Agents] Itching and Other (See Comments)    Itching of feet  . Ioxaglate Itching and Other (See Comments)    Itching of feet  . Metrizamide Itching and Other (See Comments)    Itching of feet  . Percocet [Oxycodone-Acetaminophen] Itching and Other (See Comments)    Tolerates Hydrocodone    Medications Prior to Admission  Medication Sig Dispense Refill  . albuterol (PROVENTIL HFA;VENTOLIN HFA) 108 (90 Base) MCG/ACT inhaler Inhale 2 puffs into the lungs every 4 (four) hours as needed for wheezing or shortness of breath. ProAir 1 each 0  . allopurinol (ZYLOPRIM) 100 MG tablet Take 100 mg by mouth daily.  2  . ALPRAZolam (XANAX) 0.5 MG tablet Take 0.25 mg by mouth at bedtime as needed for anxiety.     .Marland KitchenamLODipine (NORVASC) 10 MG tablet Take 10 mg by mouth daily.      . Artificial Tear Solution (SOOTHE XP OP) Apply 1-2 drops to eye 3 (three) times daily as needed (irration).    .Marland Kitchenatorvastatin (LIPITOR) 40 MG tablet Take 40 mg by mouth every evening.     .Marland KitchenBesifloxacin HCl (BESIVANCE) 0.6 % SUSP Place 1 drop into the right eye 3 (three) times daily.    . Bromfenac Sodium (PROLENSA) 0.07 % SOLN Place 1 drop into the right eye daily.    . carvedilol (COREG) 25 MG tablet TAKE 1 TABLET BY MOUTH 2 TIMES A DAY WITH A MEAL  (Patient taking differently: TAKE 25 MG BY MOUTH 2 TIMES A DAY WITH A MEAL) 60 tablet 9  .  Cholecalciferol (VITAMIN D3) 5000 units TABS Take 5,000 Units by mouth daily.    . cloNIDine (CATAPRES) 0.2 MG tablet Take 1 tablet (0.2 mg total) by mouth 3 (three) times daily. 90 tablet 11  . clopidogrel (PLAVIX) 75 MG tablet Take 1 tablet (75 mg total) by mouth daily with breakfast. 90 tablet 3  . Difluprednate (DUREZOL) 0.05 % EMUL Place 1 drop into the right eye daily.    Marland Kitchen docusate sodium (COLACE) 100 MG capsule Take 200 mg by mouth at bedtime as needed for mild constipation.    . DULoxetine (CYMBALTA) 60 MG capsule Take one capsule (60 mg dose) by mouth daily.  1  . famotidine (PEPCID) 20 MG tablet TAKE 1 TABLET (20 MG TOTAL) BY MOUTH DAILY BEFORE SUPPER. 30 tablet 2  . fenofibrate 160 MG tablet Take 1 tablet (160 mg total) by mouth daily. 30 tablet 1  . Ferrous Sulfate (IRON) 28 MG TABS Take 28 mg by mouth 2 (two) times daily.    . furosemide (LASIX) 40 MG tablet Take 40 mg by mouth daily. May take an additional tablet day as needed for weight gain   6  . gabapentin (NEURONTIN) 300 MG capsule Take 1 capsule (300 mg total) by mouth 2 (two) times daily. 60 capsule 0  . hydrALAZINE (APRESOLINE) 100 MG tablet Take 100 mg by mouth 3 (three) times daily.    Marland Kitchen HYDROcodone-acetaminophen (NORCO/VICODIN) 5-325 MG tablet Take 1 tablet by mouth every 8 (eight) hours as needed for moderate pain. 8 tablet 0  . isosorbide mononitrate (IMDUR) 30 MG 24 hr tablet Take 0.5 tablets (15 mg total) by mouth daily. 45 tablet 3  . mirtazapine (REMERON) 7.5 MG tablet Take 7.5 mg by mouth at bedtime.     . pantoprazole (PROTONIX) 40 MG tablet Take 1 tablet (40 mg total) by mouth daily. 30 tablet 11  . Potassium 99 MG TABS Take 99 mg by mouth daily.     Marland Kitchen zolpidem (AMBIEN) 10 MG tablet Take 10 mg by mouth at bedtime.    . nitroGLYCERIN (NITRO-DUR) 0.2 mg/hr patch Place 1 patch (0.2 mg total) onto the skin daily. Remove old  patch before applying new patch. Apply around the same time every day. (Patient not taking: Reported on 04/09/2018) 30 patch 12    Blood pressure (!) 152/54, pulse (!) 55, temperature 98.1 F (36.7 C), temperature source Oral, resp. rate 18, height '5\' 5"'$  (1.651 m), weight 60.5 kg, SpO2 98 %. Physical Exam: Physical Exam  Constitutional: She is oriented to person, place, and time. She appears well-developed. No distress.  HENT:  Head: Normocephalic and atraumatic.  Right Ear: External ear normal.  Left Ear: External ear normal.  Mouth/Throat: Oropharynx is clear and moist.  Eyes: Pupils are equal, round, and reactive to light. EOM are normal. Right eye exhibits no discharge. Left eye exhibits no discharge. No scleral icterus.  Neck: Normal range of motion. No JVD present. No tracheal deviation present. No thyromegaly present.  Cardiovascular: Normal rate, regular rhythm, normal heart sounds and intact distal pulses. Exam reveals no gallop and no friction rub.  Pulmonary/Chest: Effort normal. No stridor. No respiratory distress. She has no wheezes. She has no rales.  Some rhonchi bilaterally  Abdominal: Soft. She exhibits distension. She exhibits no mass. There is tenderness. There is no rebound. No hernia.  Moderate epigastric and RUQ tenderness to light palpation without peritonitis   Musculoskeletal: Normal range of motion. She exhibits no edema, tenderness or deformity.  Neurological:  She is alert and oriented to person, place, and time. She exhibits normal muscle tone.  Skin: Skin is warm and dry. No rash noted. She is not diaphoretic.  Psychiatric: She has a normal mood and affect. Her behavior is normal.    Results for orders placed or performed during the hospital encounter of 04/09/18 (from the past 48 hour(s))  Hemoglobin and hematocrit, blood     Status: Abnormal   Collection Time: 04/12/18  1:38 PM  Result Value Ref Range   Hemoglobin 6.8 (LL) 12.0 - 15.0 g/dL    Comment:  REPEATED TO VERIFY CRITICAL RESULT CALLED TO, READ BACK BY AND VERIFIED WITH: D LEWIS,RN AT 1404 04/12/18 BY L BENFIELD    HCT 21.8 (L) 36.0 - 46.0 %    Comment: Performed at [facility] [address] [place], [place] [address]  Prepare RBC     Status: None   Collection Time: 04/12/18  2:16 PM  Result Value Ref Range   Order Confirmation      ORDER PROCESSED BY BLOOD BANK Performed at [facility], [place] [address] [place], [place] [address]   CBC     Status: Abnormal   Collection Time: 04/13/18 12:07 AM  Result Value Ref Range   WBC 10.9 (H) 4.0 - 10.5 K/uL   RBC 2.73 (L) 3.87 - 5.11 MIL/uL   Hemoglobin 8.0 (L) 12.0 - 15.0 g/dL   HCT 25.7 (L) 36.0 - 46.0 %   MCV 94.1 78.0 - 100.0 fL   MCH 29.3 26.0 - 34.0 pg   MCHC 31.1 30.0 - 36.0 g/dL   RDW 19.5 (H) 11.5 - 15.5 %   Platelets 222 150 - 400 K/uL    Comment: Performed at [facility], [place] [address], [place], [place] [address]  Basic metabolic panel     Status: Abnormal   Collection Time: 04/13/18 12:07 AM  Result Value Ref Range   Sodium 137 135 - 145 mmol/L   Potassium 4.5 3.5 - 5.1 mmol/L   Chloride 102 98 - 111 mmol/L   CO2 27 22 - 32 mmol/L   Glucose, Bld 127 (H) 70 - 99 mg/dL   BUN 35 (H) 8 - 23 mg/dL   Creatinine, Ser 2.65 (H) 0.44 - 1.00 mg/dL   Calcium 8.1 (L) 8.9 - 10.3 mg/dL   GFR calc non Af Amer 18 (L) >60 mL/min   GFR calc Af Amer 21 (L) >60 mL/min    Comment: (NOTE) The eGFR has been calculated using the CKD EPI equation. This calculation has not been validated in all clinical situations. eGFR's persistently <60 mL/min signify possible Chronic Kidney Disease.    Anion gap 8 5 - 15    Comment: Performed at [facility] [address] [place], [place] [address]  Lipase, blood     Status: Abnormal   Collection Time: 04/13/18 12:07 AM  Result Value Ref Range   Lipase 1,533 (H) 11 - 51 U/L    Comment: RESULTS CONFIRMED BY MANUAL DILUTION Performed at [facility],  [place] [address] [place] [address]   CBC     Status: Abnormal   Collection Time: 04/14/18  4:19 AM  Result Value Ref Range   WBC 8.0 4.0 - 10.5 K/uL   RBC 2.84 (L) 3.87 - 5.11 MIL/uL   Hemoglobin 8.3 (L) 12.0 - 15.0 g/dL   HCT 27.0 (L) 36.0 - 46.0 %   MCV 95.1 78.0 -  100.0 fL   MCH 29.2 26.0 - 34.0 pg   MCHC 30.7 30.0 - 36.0 g/dL   RDW 19.5 (H) 11.5 - 15.5 %   Platelets 220 150 - 400 K/uL    Comment: Performed at [facility] [address], [place], [place] [address]  Lipase, blood     Status: Abnormal   Collection Time: 04/14/18  4:19 AM  Result Value Ref Range   Lipase 197 (H) 11 - 51 U/L    Comment: Performed at [facility], [place] [address] [place], [place] [address]  Comprehensive metabolic panel     Status: Abnormal   Collection Time: 04/14/18  4:19 AM  Result Value Ref Range   Sodium 139 135 - 145 mmol/L   Potassium 4.6 3.5 - 5.1 mmol/L   Chloride 102 98 - 111 mmol/L   CO2 25 22 - 32 mmol/L   Glucose, Bld 104 (H) 70 - 99 mg/dL   BUN 33 (H) 8 - 23 mg/dL   Creatinine, Ser 2.64 (H) 0.44 - 1.00 mg/dL   Calcium 8.5 (L) 8.9 - 10.3 mg/dL   Total Protein 5.0 (L) 6.5 - 8.1 g/dL   Albumin 2.3 (L) 3.5 - 5.0 g/dL   AST 14 (L) 15 - 41 U/L   ALT 7 0 - 44 U/L   Alkaline Phosphatase 34 (L) 38 - 126 U/L   Total Bilirubin 1.3 (H) 0.3 - 1.2 mg/dL   GFR calc non Af Amer 18 (L) >60 mL/min   GFR calc Af Amer 21 (L) >60 mL/min    Comment: (NOTE) The eGFR has been calculated using the CKD EPI equation. This calculation has not been validated in all clinical situations. eGFR's persistently <60 mL/min signify possible Chronic Kidney Disease.    Anion gap 12 5 - 15    Comment: Performed at [facility] [address], [place], [place] [address]   Ct Abdomen Pelvis Wo Contrast  Result Date: 04/12/2018 CLINICAL DATA:  67 year old with chronic renal insufficiency. Prior endovascular repair of abdominal aortic aneurysm in May, 2019. Patient currently with unexplained  anemia and had a negative UPPER endoscopy earlier today. Evaluate for occult hemorrhage or hematoma, as the patient had a retroperitoneal hematoma after her endovascular abdominal aortic aneurysm repair. EXAM: CT ABDOMEN AND PELVIS WITHOUT CONTRAST TECHNIQUE: Multidetector CT imaging of the abdomen and pelvis was performed following the standard protocol without IV contrast. COMPARISON:  03/06/2018, 01/19/2018, 01/14/2018 and earlier. FINDINGS: Respiratory motion blurred images of the lung bases and the upper abdomen, making the examination less than optimal. Lower chest: Mild atelectasis involving the RIGHT LOWER LOBE. Visualized lung bases otherwise clear. Heart moderately enlarged with severe RIGHT coronary artery atherosclerosis. Hepatobiliary: Normal unenhanced appearance of the liver. High attenuation bile/sludge and small gallstones within the gallbladder. No convincing pericholecystic inflammation. No biliary ductal dilation. Pancreas: Edema/inflammation surrounding the head of the pancreas with a 1.9 cm cyst in the head of the pancreas, not present on prior CTs. Mild edema/inflammation adjacent to the body of the pancreas. Spleen: Normal unenhanced appearance. Adrenals/Urinary Tract: Normal appearing RIGHT adrenal gland. Stable 1.2 cm low-attenuation LEFT adrenal nodule dating back to 2012. Small non-obstructing calculi involving UPPER pole calices of both kidneys. No urinary tract calculi elsewhere. No hydronephrosis involving either kidney. Within the limits of the unenhanced technique, no focal parenchymal abnormality involving either kidney. Stomach/Bowel: Stomach normal in appearance for the degree of distention. Wall thickening involving the descending duodenum and the proximal transverse  duodenum. Remainder of the small bowel normal in appearance. Descending colon, sigmoid colon and rectum decompressed. Surgical anastomotic suture material at the rectum, widely patent. Moderate stool burden involving  the ascending colon. Surgically absent appendix. Vascular/Lymphatic: Prior endovascular abdominal aortic aneurysm repair with an aorto-bi-iliac stent graft. Native thrombosed aneurysm measures approximately 5.0 cm, unchanged since the CT 1 month ago and slightly decreased in size since May, 2019. 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 No pathologic lymphadenopathy. Reproductive: Surgically absent uterus.  No adnexal masses. Other: Approximate 5.4 x 4.0 x 7.1 cm fluid collection involving the LEFT retroperitoneum in the UPPER pelvis, further decreased in size since the CT 1 month ago, indicating a resolving retroperitoneal hematoma. No new hemorrhage or hematoma in the abdomen or pelvis. Musculoskeletal: Calcification in the POSTERIOR annular fibers of protruded discs at L3-4 and L4-5 in this patient with 6 non-rib-bearing lumbar vertebrae. IMPRESSION: 1. Inflammatory changes involving the descending and proximal transverse duodenum and the pancreatic head and body. Pancreatitis with secondary inflammation of the duodenum is favored over duodenitis/duodenal ulcer with secondary pancreatitis. 2. Cholelithiasis and gallbladder sludge. No convincing evidence of acute cholecystitis. 3. No evidence of acute hemorrhage or hematoma in the abdomen or pelvis. Resolving LEFT retroperitoneal hematoma which has further decreased in size since 03/06/2018. 4. Endovascular abdominal aortic aneurysm repair. While endoleak cannot be entirely excluded without contrast administration, the fact that the thrombosed native aorta is stable in size suggests the absence of an endoleak. Electronically Signed   By: Evangeline Dakin M.D.   On: 04/12/2018 20:07   US Abdomen Limited Ruq  Result Date: 04/13/2018 CLINICAL DATA:  Pancreatitis for 1 day. History of appendectomy and AAA repair. Chronic kidney disease stage 5. COPD. Hypertension. EXAM: ULTRASOUND ABDOMEN  LIMITED RIGHT UPPER QUADRANT COMPARISON:  CT of the abdomen and pelvis on 04/12/2018 FINDINGS: Gallbladder: Gallbladder wall is thickened, 4.8 millimeters. There is fluid within the gallbladder wall and a small amount of pericholecystic fluid. Gallstones are present, measuring approximately 5 millimeters in diameter. Gallbladder sludge is also noted. Patient is tender throughout the RIGHT UPPER QUADRANT, nonspecific for acute cholecystitis. Common bile duct: Diameter: 4.0 millimeter Liver: No focal lesion identified. Within normal limits in parenchymal echogenicity. Portal vein is patent on color Doppler imaging with normal direction of blood flow towards the liver. IMPRESSION: 1. Gallstones and gallbladder sludge. 2. Fluid within a thickened gallbladder wall, suspicious for gangrenous cholecystitis. Nonfocal tenderness throughout the RIGHT UPPER QUADRANT during exam. 3. Small amount of perihepatic fluid. These results were called by telephone at the time of interpretation on 04/13/2018 at 9:08 pm to [facility] with [facility] who verbally acknowledged these results. Electronically Signed   By: Nolon Nations M.D.   On: 04/13/2018 21:09   Assessment/Plan H/o AAA repair 01/2018  CAD s/p CABG x3 on plavix - last dose plavix  04/11/18 at 10:30 AM HTN Chronic diastolic HF CKD V - baseline Cr around 3  COPD on nocturnal oxygen therapy Chronic back pain Tobacco abuse Marijuana use   Gallstone pancreatitis  - CT Abd/Pelvis W/O on 8/8 significant for edema/inflammation surrounding the head of the pancreas with a 1.9 cm cyst in the head of the pancreas, inflamed duodenum -  RUQ U/S with GB wall thickening, pericholecystic fluid, and gallbladder sludge; cholecystitis vs reactive inflammation from pancreatitis  -  Afebrile, VSS, leukocytosis resolved  - AST 14,  ALT 7, alk phos 34, t.bili 1.3 (from 1.7) - lipase 197  from 1,533  - recommend NPO and IVF given abdominal tenderness on exam and CT findings  above - start IV Rocephin for possible cholecystitis  - mobilize/OOB    Symptomatic anemia of unknown etiology - anemia stable s/p 3u pRBC, upper endoscopy negative. Last colonoscopy was prior to partial colectomy. I think this patient needs colonoscopy to r/o colon mass or colonic source of bleeding.   FEN: NPO, IVF ID: Rocephin 8/9 >> VTE: SCD's, chemical VTE held 2/2 anemia, continue to hold plavix for possible procedure   Plan: ongoing work-up of anemia, recommend discussing colonoscopy with GI.  Follow pancreatitis clinically, at this point she is not ready for laparoscopic cholecystectomy.  Jill Alexanders, [facility] 04/14/2018, 8:42 AM Pager: [phone] Consults: [phone] Mon-Fri 7:00 am-4:30 pm Sat-Sun 7:00 am-11:30 am

## 2018-04-14 NOTE — Progress Notes (Signed)
Physical Therapy Treatment Patient Details Name: [name] MRN: [id number] DOB: [birth year] Today's Date: 04/14/2018    History of Present Illness 67 year old woman with medical problems including chronic kidney disease stage 5, CAD status post CABG in 2012, repair of abdominal aortic aneurysm in May 2019, COPD, nocturnal use of oxygen, chronic lumbar back pain, and hypertension presenting with weakness.    PT Comments    Pt performed gait training and seated exercises at edge of bed.  She remains to complain of calve and lower leg pain from her "PAD".  Pt required one standing rest break but did not use device for ambulation this afternoon.  Pt required min guard for safety with gait due to minor drifting.  Pt remains safe to return home with HHPT.      Follow Up Recommendations  Home health PT     Equipment Recommendations  None recommended by PT    Recommendations for Other Services       Precautions / Restrictions Precautions Precautions: Fall Restrictions Weight Bearing Restrictions: No    Mobility  Bed Mobility Overal bed mobility: Needs Assistance         Sit to supine: Min guard   General bed mobility comments: Increased time and effort min guard to stedy.    Transfers Overall transfer level: Needs assistance Equipment used: None Transfers: Sit to/from Stand Sit to Stand: Supervision         General transfer comment: Cues for hand placement to and from seated surface.    Ambulation/Gait Ambulation/Gait assistance: Supervision Gait Distance (Feet): 300 Feet Assistive device: Rolling walker (2 wheeled) Gait Pattern/deviations: Step-through pattern;Trunk flexed;Staggering right;Drifts right/left     General Gait Details: Cues for reciprocal armswing, pt mildly unsteady with device.  Pt performed gait trial with one standing rest break.  Mild DOE observed,  Spo2 94% on RA.     Stairs             Wheelchair Mobility    Modified Rankin  (Stroke Patients Only)       Balance     Sitting balance-Leahy Scale: Normal       Standing balance-Leahy Scale: Fair                              Cognition Arousal/Alertness: Awake/alert Behavior During Therapy: WFL for tasks assessed/performed Overall Cognitive Status: Within Functional Limits for tasks assessed                                        Exercises General Exercises - Lower Extremity Ankle Circles/Pumps: AROM;Both;10 reps;Seated Long Arc Quad: AROM;Both;10 reps;Seated Hip Flexion/Marching: AROM;Both;10 reps;Seated    General Comments        Pertinent Vitals/Pain Pain Assessment: 0-10 Pain Location: B calves Pain Descriptors / Indicators: Spasm;Cramping Pain Intervention(s): Monitored during session;Repositioned;Ice applied    Home Living                      Prior Function            PT Goals (current goals can now be found in the care plan section) Acute Rehab PT Goals Patient Stated Goal: To get this pancreatitis to stop Potential to Achieve Goals: Good Progress towards PT goals: Progressing toward goals    Frequency    Min 3X/week  PT Plan Current plan remains appropriate    Co-evaluation              AM-PAC PT "6 Clicks" Daily Activity  Outcome Measure  Difficulty turning over in bed (including adjusting bedclothes, sheets and blankets)?: None Difficulty moving from lying on back to sitting on the side of the bed? : A Little Difficulty sitting down on and standing up from a chair with arms (e.g., wheelchair, bedside commode, etc,.)?: A Little Help needed moving to and from a bed to chair (including a wheelchair)?: A Little Help needed walking in hospital room?: A Little Help needed climbing 3-5 steps with a railing? : A Little 6 Click Score: 19    End of Session Equipment Utilized During Treatment: Gait belt Activity Tolerance: Patient tolerated treatment well Patient left: with  call bell/phone within reach;in bed;with family/visitor present(sitting edge of bed) Nurse Communication: Mobility status PT Visit Diagnosis: Unsteadiness on feet (R26.81);Difficulty in walking, not elsewhere classified (R26.2);Muscle weakness (generalized) (M62.81)     Time: 0158-6825 PT Time Calculation (min) (ACUTE ONLY): 15 min  Charges:  $Gait Training: 8-22 mins                     Governor Rooks, PTA pager [phone]    Cristela Blue 04/14/2018, 1:33 PM

## 2018-04-14 NOTE — Progress Notes (Addendum)
[facility] Progress Note   Chief Complaint:   Pancreatitis.    SUBJECTIVE:   she has intermittent pain in mid back radiating through to front under her bilateral ribcage but mainly associated with movement.   ASSESSMENT AND PLAN:   65. 67 yo female with multiple medical problems, significant vascular history on chronic Plavix until being admitted now with anemia (acute on chronic) and maroon, heme positive stool. 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 -We signed off case with plans to see in office to discuss colonoscopy. Previous attempts at colonoscopy at [facility] failed due to severe hypertrophy of colon, possibly from diverticular disease. She ultimately required virtual colonoscopy per [facility] notes.  -She had a AAA repair in May, complicated by large RP hematoma with subsequent drop in hgb from 11 range to 9 range. With further decline in hgb this admission and negative EGD Vascular evaluated and ordered repeat CT scan which shows no evidence for acute hemorrhage. The RP hematoma has decreased in size.   2. Acute pancreatitis / ? Gangrenous cholecystitis.  Lipase 1500. Non-contrast CT scan ( done to rule out RP bleed as cause of worsening anemia) shows cholelithiasis and gb sludge. No evidence for acute cholecystitis. There are inflammatory changes involving descending and  proximal transverse duodenum and pancreatic head and body. There is a 1.9 cm cyst in head of pancreas, new. Pancreatitis favored. Minimally elevated tbil but predominantly indirect and remainder of liver chemistries normal. No biliary duct dilation on CT scan. U/S raises concern for gangrenous cholecystitis -For two months patient has had intermittent pain in back with radiation through to abdomen (under bilateral ribcages) but pain mainly associated with moving around. This could be bilary pancreatitis but unusual given normal liver labs. Secondary to biliary sludge?  -She will  need better imaging to exclude underlying neoplasm. Probably MR at some point. Has CKD limiting use of IV contrast for a CT scan -supportive care for now .  -getting ice chips for now -On Rocephin for ? Cholecystitis -surgery has evaluated, probably lap chole later next week   Attending physician's note   I have taken an interval history, reviewed the chart and examined the patient. I agree with the Advanced Practitioner's note, impression and recommendations.   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  She had GI work-up approximately 5 years ago by Dr. Dorrene German -negative EGD, incomplete colonoscopy (due to sigmoid hypertrophy/angulation) followed by virtual colonoscopy.  This admission had negative EGD except for melanosis.  Now with acute pancreatitis.  Has cholelithiasis and questionable cholecystitis on antibiotics.  Liver function tests are normal.  No findings suggestive of choledocholithiasis.  Surgery is following.  Cannot order a CT due to CRI.  Surgery planning for laparoscopic cholecystectomy later next week.  We would attempt colonoscopy as an outpatient.  If unsuccessful, would consider virtual colonoscopy.  Carmell Austria, MD  OBJECTIVE:     Vital signs in last 24 hours: Temp:  [98.1 F (36.7 C)-98.8 F (37.1 C)] 98.3 F (36.8 C) (08/09 1335) Pulse Rate:  [52-56] 52 (08/09 1335) Resp:  [16-20] 20 (08/09 1335) BP: (130-152)/(48-54) 148/49 (08/09 1335) SpO2:  [94 %-100 %] 100 % (08/09 1335) Last BM Date: 04/13/18 General:   Alert, female in NAD EENT:  Normal hearing, non icteric sclera, conjunctive pink.  Heart:  Regular  rate and rhythm; no murmurs. No lower extremity edema Pulm: Normal respiratory effor. Abdomen:  Soft, mildly distended with good BS. She has RUQ / R mid tenderness. .     Neurologic:  Alert and  oriented x4;   grossly normal neurologically. Psych:  Pleasant, cooperative.  Normal mood and affect.   Intake/Output from previous day: 08/08 0701 - 08/09 0700 In: 223 [P.O.:220; I.V.:3] Out: -  Intake/Output this shift: Total I/O In: 316.4 [I.V.:216.4; IV Piggyback:100] Out: -   Lab Results: Recent Labs    04/12/18 0557 04/12/18 1338 04/13/18 0007 04/14/18 0419  WBC 13.0*  --  10.9* 8.0  HGB 7.2* 6.8* 8.0* 8.3*  HCT 23.2* 21.8* 25.7* 27.0*  PLT 241  --  222 220   BMET Recent Labs    04/12/18 0557 04/13/18 0007 04/14/18 0419  NA 136 137 139  K 4.4 4.5 4.6  CL 98 102 102  CO2 26 27 25   GLUCOSE 131* 127* 104*  BUN 39* 35* 33*  CREATININE 2.73* 2.65* 2.64*  CALCIUM 7.8* 8.1* 8.5*   LFT Recent Labs    04/12/18 0557 04/14/18 0419  PROT 4.6* 5.0*  ALBUMIN 2.2* 2.3*  AST 12* 14*  ALT 6 7  ALKPHOS 27* 34*  BILITOT 1.7* 1.3*  BILIDIR 0.5*  --   IBILI 1.2*  --    PT/INR No results for input(s): LABPROT, INR in the last 72 hours. Hepatitis Panel No results for input(s): HEPBSAG, HCVAB, HEPAIGM, HEPBIGM in the last 72 hours.  Ct Abdomen Pelvis Wo Contrast  Result Date: 04/12/2018 CLINICAL DATA:  67 year old with chronic renal insufficiency. Prior endovascular repair of abdominal aortic aneurysm in May, 2019. Patient currently with unexplained anemia and had a negative UPPER endoscopy earlier today. Evaluate for occult hemorrhage or hematoma, as the patient had a retroperitoneal hematoma after her endovascular abdominal aortic aneurysm repair. EXAM: CT ABDOMEN AND PELVIS WITHOUT CONTRAST TECHNIQUE: Multidetector CT imaging of the abdomen and pelvis was performed following the standard protocol without IV contrast. COMPARISON:  03/06/2018, 01/19/2018, 01/14/2018 and earlier. FINDINGS: Respiratory motion blurred images of the lung bases and the upper abdomen, making the examination less than optimal. Lower chest: Mild atelectasis involving the RIGHT LOWER LOBE. Visualized lung bases  otherwise clear. Heart moderately enlarged with severe RIGHT coronary artery atherosclerosis. Hepatobiliary: Normal unenhanced appearance of the liver. High attenuation bile/sludge and small gallstones within the gallbladder. No convincing pericholecystic inflammation. No biliary ductal dilation. Pancreas: Edema/inflammation surrounding the head of the pancreas with a 1.9 cm cyst in the head of the pancreas, not present on prior CTs. Mild edema/inflammation adjacent to the body of the pancreas. Spleen: Normal unenhanced appearance. Adrenals/Urinary Tract: Normal appearing RIGHT adrenal gland. Stable 1.2 cm low-attenuation LEFT adrenal nodule dating back to 2012. Small non-obstructing calculi involving UPPER pole calices of both kidneys. No urinary tract calculi elsewhere. No hydronephrosis involving either kidney. Within the limits of the unenhanced technique, no focal parenchymal abnormality involving either kidney. Stomach/Bowel: Stomach normal in appearance for the degree of distention. Wall thickening involving the descending duodenum and the proximal transverse duodenum. Remainder of the small bowel normal in appearance. Descending colon, sigmoid colon and rectum decompressed. Surgical anastomotic suture material at the rectum, widely patent. Moderate stool burden involving the ascending colon. Surgically absent appendix. Vascular/Lymphatic: Prior endovascular abdominal aortic aneurysm repair with an aorto-bi-iliac stent graft. Native thrombosed aneurysm measures approximately 5.0 cm, unchanged since the CT 1 month ago and slightly decreased in size since May, 2019. 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 No pathologic lymphadenopathy. Reproductive: Surgically absent uterus.  No adnexal masses. Other: Approximate 5.4 x 4.0 x 7.1 cm fluid collection involving the LEFT retroperitoneum in the UPPER pelvis, further decreased in size since  the CT 1 month ago, indicating a resolving retroperitoneal hematoma. No new hemorrhage or hematoma in the abdomen or pelvis. Musculoskeletal: Calcification in the POSTERIOR annular fibers of protruded discs at L3-4 and L4-5 in this patient with 6 non-rib-bearing lumbar vertebrae. IMPRESSION: 1. Inflammatory changes involving the descending and proximal transverse duodenum and the pancreatic head and body. Pancreatitis with secondary inflammation of the duodenum is favored over duodenitis/duodenal ulcer with secondary pancreatitis. 2. Cholelithiasis and gallbladder sludge. No convincing evidence of acute cholecystitis. 3. No evidence of acute hemorrhage or hematoma in the abdomen or pelvis. Resolving LEFT retroperitoneal hematoma which has further decreased in size since 03/06/2018. 4. Endovascular abdominal aortic aneurysm repair. While endoleak cannot be entirely excluded without contrast administration, the fact that the thrombosed native aorta is stable in size suggests the absence of an endoleak. Electronically Signed   By: Evangeline Dakin M.D.   On: 04/12/2018 20:07   US Abdomen Limited Ruq  Result Date: 04/13/2018 CLINICAL DATA:  Pancreatitis for 1 day. History of appendectomy and AAA repair. Chronic kidney disease stage 5. COPD. Hypertension. EXAM: ULTRASOUND ABDOMEN LIMITED RIGHT UPPER QUADRANT COMPARISON:  CT of the abdomen and pelvis on 04/12/2018 FINDINGS: Gallbladder: Gallbladder wall is thickened, 4.8 millimeters. There is fluid within the gallbladder wall and a small amount of pericholecystic fluid. Gallstones are present, measuring approximately 5 millimeters in diameter. Gallbladder sludge is also noted. Patient is tender throughout the RIGHT UPPER QUADRANT, nonspecific for acute cholecystitis. Common bile duct: Diameter: 4.0 millimeter Liver: No focal lesion identified. Within normal limits in parenchymal echogenicity. Portal vein is patent on color Doppler imaging with normal direction of blood  flow towards the liver. IMPRESSION: 1. Gallstones and gallbladder sludge. 2. Fluid within a thickened gallbladder wall, suspicious for gangrenous cholecystitis. Nonfocal tenderness throughout the RIGHT UPPER QUADRANT during exam. 3. Small amount of perihepatic fluid. These results were called by telephone at the time of interpretation on 04/13/2018 at 9:08 pm to [facility] with [facility] who verbally acknowledged these results. Electronically Signed   By: Nolon Nations M.D.   On: 04/13/2018 21:09    Principal Problem:   Symptomatic anemia Active Problems:   TOBACCO ABUSE   CKD (chronic kidney disease)   COPD (chronic obstructive pulmonary disease) (HCC)   S/P CABG x 3   S/P Maze operation for atrial fibrillation   S/P MVR (mitral valve repair)   AAA (abdominal aortic aneurysm) without rupture (HCC)   Anemia   GERD (gastroesophageal reflux disease)   PAD (peripheral artery disease) (HCC)   Type II diabetes mellitus (HCC)   Hyperlipidemia   CAD (coronary artery disease)   Chronic kidney disease (CKD), stage IV (severe) (HCC)   Acute blood loss anemia   Fatigue associated with anemia   Acute gallstone pancreatitis   Acute cholecystitis     LOS: 2 days   Tye Savoy ,NP 04/14/2018, 1:40 PM
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
## 2018-04-14 NOTE — Plan of Care (Signed)
  Problem: Activity: Goal: Risk for activity intolerance will decrease Outcome: Progressing   Problem: Coping: Goal: Level of anxiety will decrease Outcome: Progressing   Problem: Elimination: Goal: Will not experience complications related to urinary retention Outcome: Progressing   

## 2018-04-15 LAB — BASIC METABOLIC PANEL
Anion gap: 8 (ref 5–15)
BUN: 24 mg/dL — AB (ref 8–23)
CHLORIDE: 114 mmol/L — AB (ref 98–111)
CO2: 20 mmol/L — ABNORMAL LOW (ref 22–32)
Calcium: 6.8 mg/dL — ABNORMAL LOW (ref 8.9–10.3)
Creatinine, Ser: 2.01 mg/dL — ABNORMAL HIGH (ref 0.44–1.00)
GFR, EST AFRICAN AMERICAN: 29 mL/min — AB (ref >60)
GFR, EST NON AFRICAN AMERICAN: 25 mL/min — AB (ref >60)
Glucose, Bld: 78 mg/dL (ref 70–99)
POTASSIUM: 3.7 mmol/L (ref 3.5–5.1)
SODIUM: 142 mmol/L (ref 135–145)

## 2018-04-15 LAB — CBC WITH DIFFERENTIAL/PLATELET
ABS IMMATURE GRANULOCYTES: 0.1 10*3/uL (ref 0.0–0.1)
BASOS ABS: 0 10*3/uL (ref 0.0–0.1)
BASOS PCT: 1 %
Eosinophils Absolute: 0.4 10*3/uL (ref 0.0–0.7)
Eosinophils Relative: 6 %
HCT: 24 % — ABNORMAL LOW (ref 36.0–46.0)
HEMOGLOBIN: 7.3 g/dL — AB (ref 12.0–15.0)
Immature Granulocytes: 1 %
Lymphocytes Relative: 6 %
Lymphs Abs: 0.4 10*3/uL — ABNORMAL LOW (ref 0.7–4.0)
MCH: 29.4 pg (ref 26.0–34.0)
MCHC: 30.4 g/dL (ref 30.0–36.0)
MCV: 96.8 fL (ref 78.0–100.0)
MONO ABS: 0.7 10*3/uL (ref 0.1–1.0)
Monocytes Relative: 9 %
NEUTROS PCT: 77 %
Neutro Abs: 5.6 10*3/uL (ref 1.7–7.7)
PLATELETS: 197 10*3/uL (ref 150–400)
RBC: 2.48 MIL/uL — ABNORMAL LOW (ref 3.87–5.11)
RDW: 19 % — ABNORMAL HIGH (ref 11.5–15.5)
WBC: 7.2 10*3/uL (ref 4.0–10.5)

## 2018-04-15 LAB — HEPATIC FUNCTION PANEL
ALT: 7 U/L (ref 0–44)
AST: 10 U/L — AB (ref 15–41)
Albumin: 1.8 g/dL — ABNORMAL LOW (ref 3.5–5.0)
Alkaline Phosphatase: 34 U/L — ABNORMAL LOW (ref 38–126)
BILIRUBIN DIRECT: 0.3 mg/dL — AB (ref 0.0–0.2)
BILIRUBIN INDIRECT: 0.5 mg/dL (ref 0.3–0.9)
TOTAL PROTEIN: 3.9 g/dL — AB (ref 6.5–8.1)
Total Bilirubin: 0.8 mg/dL (ref 0.3–1.2)

## 2018-04-15 LAB — HEMOGLOBIN AND HEMATOCRIT, BLOOD
HCT: 27.6 % — ABNORMAL LOW (ref 36.0–46.0)
Hemoglobin: 8.3 g/dL — ABNORMAL LOW (ref 12.0–15.0)

## 2018-04-15 LAB — LIPASE, BLOOD: LIPASE: 169 U/L — AB (ref 11–51)

## 2018-04-15 MED ORDER — SODIUM CHLORIDE 0.9 % IV SOLN
INTRAVENOUS | Status: AC
  Administered 2018-04-15: 20:00:00 via INTRAVENOUS
  Administered 2018-04-15: 50 mL/h via INTRAVENOUS
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
## 2018-04-15 NOTE — Plan of Care (Signed)
  Problem: Pain Managment: Goal: General experience of comfort will improve Outcome: Progressing   Problem: Health Behavior/Discharge Planning: Goal: Ability to manage health-related needs will improve Outcome: Progressing

## 2018-04-16 LAB — HEPATIC FUNCTION PANEL
ALK PHOS: 41 U/L (ref 38–126)
ALT: 7 U/L (ref 0–44)
AST: 12 U/L — AB (ref 15–41)
Albumin: 2.3 g/dL — ABNORMAL LOW (ref 3.5–5.0)
BILIRUBIN DIRECT: 0.3 mg/dL — AB (ref 0.0–0.2)
BILIRUBIN TOTAL: 1.1 mg/dL (ref 0.3–1.2)
Indirect Bilirubin: 0.8 mg/dL (ref 0.3–0.9)
Total Protein: 5 g/dL — ABNORMAL LOW (ref 6.5–8.1)

## 2018-04-16 LAB — CBC WITH DIFFERENTIAL/PLATELET
ABS IMMATURE GRANULOCYTES: 0.1 10*3/uL (ref 0.0–0.1)
Basophils Absolute: 0.1 10*3/uL (ref 0.0–0.1)
Basophils Relative: 1 %
EOS ABS: 0.3 10*3/uL (ref 0.0–0.7)
Eosinophils Relative: 4 %
HEMATOCRIT: 29.1 % — AB (ref 36.0–46.0)
Hemoglobin: 8.5 g/dL — ABNORMAL LOW (ref 12.0–15.0)
Immature Granulocytes: 1 %
Lymphocytes Relative: 4 %
Lymphs Abs: 0.3 10*3/uL — ABNORMAL LOW (ref 0.7–4.0)
MCH: 29.1 pg (ref 26.0–34.0)
MCHC: 29.2 g/dL — ABNORMAL LOW (ref 30.0–36.0)
MCV: 99.7 fL (ref 78.0–100.0)
MONO ABS: 0.6 10*3/uL (ref 0.1–1.0)
MONOS PCT: 7 %
Neutro Abs: 7.4 10*3/uL (ref 1.7–7.7)
Neutrophils Relative %: 83 %
PLATELETS: 233 10*3/uL (ref 150–400)
RBC: 2.92 MIL/uL — ABNORMAL LOW (ref 3.87–5.11)
RDW: 18.8 % — AB (ref 11.5–15.5)
WBC: 8.9 10*3/uL (ref 4.0–10.5)

## 2018-04-16 LAB — BASIC METABOLIC PANEL
Anion gap: 17 — ABNORMAL HIGH (ref 5–15)
BUN: 29 mg/dL — ABNORMAL HIGH (ref 8–23)
CALCIUM: 8.6 mg/dL — AB (ref 8.9–10.3)
CO2: 17 mmol/L — AB (ref 22–32)
CREATININE: 2.54 mg/dL — AB (ref 0.44–1.00)
Chloride: 107 mmol/L (ref 98–111)
GFR calc Af Amer: 22 mL/min — ABNORMAL LOW (ref >60)
GFR calc non Af Amer: 19 mL/min — ABNORMAL LOW (ref >60)
GLUCOSE: 69 mg/dL — AB (ref 70–99)
Potassium: 4.8 mmol/L (ref 3.5–5.1)
Sodium: 141 mmol/L (ref 135–145)

## 2018-04-16 LAB — LIPASE, BLOOD: Lipase: 281 U/L — ABNORMAL HIGH (ref 11–51)

## 2018-04-16 MED ORDER — SODIUM CHLORIDE 0.9 % IV SOLN
INTRAVENOUS | Status: DC
  Administered 2018-04-16: 12:00:00 via INTRAVENOUS

## 2018-04-16 NOTE — Progress Notes (Signed)
4 Days Post-Op  Subjective: Feeling a little better today.  Pain less.  No nausea or vomiting.  Actually hungry. Afebrile.  Heart rate 60. Lipase 281.  LFTs normal.  Creatinine 2.54.  Stable for 6 days.  Hemoglobin 8.5.  WBC normal at 8.9.  Objective: Vital signs in last 24 hours: Temp:  [98.2 F (36.8 C)-98.3 F (36.8 C)] 98.3 F (36.8 C) (08/11 0457) Pulse Rate:  [54-60] 60 (08/11 0457) Resp:  [18-20] 20 (08/11 0457) BP: (133-155)/(51-64) 139/64 (08/11 0641) SpO2:  [97 %-100 %] 97 % (08/11 0457) Last BM Date: 04/14/18  Intake/Output from previous day: 08/10 0701 - 08/11 0700 In: 984 [I.V.:884; IV Piggyback:100] Out: -  Intake/Output this shift: No intake/output data recorded.  General appearance: Alert.  Now in no distress.  Cooperative Resp: clear to auscultation bilaterally GI: Soft.  Minimally tender now.  No mass.  No distention.  Lab Results:  Recent Labs    04/15/18 0529 04/15/18 1412 04/16/18 0610  WBC 7.2  --  8.9  HGB 7.3* 8.3* 8.5*  HCT 24.0* 27.6* 29.1*  PLT 197  --  233   BMET Recent Labs    04/15/18 0529 04/16/18 0610  NA 142 141  K 3.7 4.8  CL 114* 107  CO2 20* 17*  GLUCOSE 78 69*  BUN 24* 29*  CREATININE 2.01* 2.54*  CALCIUM 6.8* 8.6*   PT/INR No results for input(s): LABPROT, INR in the last 72 hours. ABG No results for input(s): PHART, HCO3 in the last 72 hours.  Invalid input(s): PCO2, PO2  Studies/Results: No results found.  Anti-infectives: Anti-infectives (From admission, onward)   Start     Dose/Rate Route Frequency Ordered Stop   04/14/18 1100  cefTRIAXone (ROCEPHIN) 2 g in sodium chloride 0.9 % 100 mL IVPB    Note to Pharmacy:  Pharmacy may adjust dosing strength / duration / interval for maximal efficacy - dose checked and appropriate   2 g 200 mL/hr over 30 Minutes Intravenous Every 24 hours 04/14/18 1018        Assessment/Plan: s/p Procedure(s): ESOPHAGOGASTRODUODENOSCOPY (EGD)  Acute gallstone pancreatitis.   Clinically resolving.  Moderate inflammatory changes around the head of pancreas and duodenum on CT -Allow clear liquid diet -Labs tomorrow -N.p.o. after midnight -Might be ready for laparoscopic cholecystectomy in a day or 2, depending on clinical resolution of pancreatitis  Gallstones, but no evidence of acute cholecystitis radiographically   CAD status post CABG 2012 AAA repair 2019-endovascular stent COPD Chronic back pain CKD Appendectomy 2016    LOS: 4 days    Adin Hector 04/16/2018
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
## 2018-04-16 NOTE — Plan of Care (Signed)
  Problem: Health Behavior/Discharge Planning: Goal: Ability to manage health-related needs will improve Outcome: Progressing   Problem: Nutrition: Goal: Adequate nutrition will be maintained Outcome: Not Progressing   Problem: Pain Managment: Goal: General experience of comfort will improve 04/16/2018 0324 by Irish Lack, RN Outcome: Not Progressing 04/15/2018 2000 by Irish Lack, RN Outcome: Progressing

## 2018-04-16 NOTE — Progress Notes (Addendum)
[facility] Progress Note   Chief Complaint:   GI bleed and pancreatitis    SUBJECTIVE:    awoke her from sleep. Asking for clear liquids and tray at bedside. No abdominal pain. No further GI bleeding   ASSESSMENT AND PLAN:   1. Acute pancreatitis (probably gallstone related though LFTs basically normal and no biliary duct dilation). No radiographic evidence for cholecystitis per Surgery. Interesting the pancreatitis was found incidentally on CT scan done to rule out RP bleed as cause of worsening anemia.  -There is a 1.9 cm cyst in head of pancreas, a new finding that should be followed up. Will address at follow up visit  -for cholecystectomy in a few days  2. Reddick anemia, maroon, heme +  stools on plavix. This was reason for this admit. 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  -no further GI bleeding -Plan is to sit down with her in our office once we can get outside GI records. She has had unsuccessful colonoscopies in past due to muscular hypertrophy of bowel. -hgb stable at 8.5 since blood transfusion several days ago.    Attending physician's note   I have taken an interval history, reviewed the chart and examined the patient. I agree with the Advanced Practitioner's note, impression and recommendations.   67 year old with acute pancreatitis (could be gallstone related) with normal liver function tests.  No retained CBD stones.  She is scheduled to have cholecystectomy next week.  Admitted with lower GI bleeding which has resolved.  Had negative EGD this admission.  Hb is stable.  Previous several colonoscopies at [facility] were unsuccessful. She didn't want colon this adm.  I still think it would be worthwhile to attempt another colonoscopy as an outpatient.  If unable to, then she may need virtual colonoscopy. No new GI recommendations. Trend CBC.  Can restart Plavix when okay with surgery.  Will sign off for now. Pl call with any  questions. FU in GI as outpatient.  Carmell Austria, MD Cell [phone]  OBJECTIVE:     Vital signs in last 24 hours: Temp:  [98 F (36.7 C)-98.3 F (36.8 C)] 98 F (36.7 C) (08/11 1318) Pulse Rate:  [56-60] 58 (08/11 1318) Resp:  [18-20] 18 (08/11 1318) BP: (124-155)/(55-64) 144/56 (08/11 1318) SpO2:  [92 %-97 %] 92 % (08/11 1318) Last BM Date: 04/14/18 General:   Alert, well-developed, female in NAD EENT:  Normal hearing, non icteric sclera, conjunctive pink.  Heart:  Regular rate and rhythm; no murmurs. No lower extremity edema Pulm: Normal respiratory effort. Abdomen:  Soft, nondistended, nontender.  Normal bowel sounds, no masses felt.     Neurologic:  Alert and  oriented x4;  grossly normal neurologically. Psych:  Pleasant, cooperative.  Normal mood and affect.   Intake/Output from previous day: 08/10 0701 - 08/11 0700 In: 984 [I.V.:884; IV Piggyback:100] Out: -  Intake/Output this shift: No intake/output data recorded.  Lab Results: Recent Labs    04/14/18 0419 04/15/18 0529 04/15/18 1412 04/16/18 0610  WBC 8.0 7.2  --  8.9  HGB 8.3* 7.3* 8.3* 8.5*  HCT 27.0* 24.0* 27.6* 29.1*  PLT 220 197  --  233   BMET Recent Labs    04/14/18 0419 04/15/18 0529 04/16/18 0610  NA 139 142 141  K 4.6 3.7 4.8  CL 102 114* 107  CO2 25 20* 17*  GLUCOSE 104* 78 69*  BUN 33* 24* 29*  CREATININE 2.64* 2.01* 2.54*  CALCIUM 8.5* 6.8* 8.6*   LFT Recent Labs    04/16/18 0610  PROT 5.0*  ALBUMIN 2.3*  AST 12*  ALT 7  ALKPHOS 41  BILITOT 1.1  BILIDIR 0.3*  IBILI 0.8      Principal Problem:   Symptomatic anemia Active Problems:   TOBACCO ABUSE   CKD (chronic kidney disease)   COPD (chronic obstructive pulmonary disease) (HCC)   S/P CABG x 3   S/P Maze operation for atrial fibrillation   S/P MVR (mitral valve repair)   AAA (abdominal aortic aneurysm) without rupture (HCC)   Anemia   GERD (gastroesophageal reflux disease)   PAD (peripheral artery disease)  (HCC)   Type II diabetes mellitus (HCC)   Hyperlipidemia   CAD (coronary artery disease)   Chronic kidney disease (CKD), stage IV (severe) (HCC)   Acute blood loss anemia   Fatigue associated with anemia   Acute gallstone pancreatitis   Acute cholecystitis   Gastrointestinal hemorrhage    LOS: 4 days   Tye Savoy ,NP 04/16/2018, 2:23 PM

## 2018-04-17 ENCOUNTER — Ambulatory Visit: Payer: Self-pay | Admitting: Surgery

## 2018-04-17 ENCOUNTER — Encounter (HOSPITAL_COMMUNITY): Payer: Self-pay

## 2018-04-17 ENCOUNTER — Inpatient Hospital Stay (HOSPITAL_COMMUNITY): Payer: Medicare Other

## 2018-04-17 DIAGNOSIS — E8770 Fluid overload, unspecified: Secondary | ICD-10-CM | POA: Diagnosis not present

## 2018-04-17 LAB — CBC WITH DIFFERENTIAL/PLATELET
ABS IMMATURE GRANULOCYTES: 0.1 10*3/uL (ref 0.0–0.1)
BASOS PCT: 1 %
Basophils Absolute: 0 10*3/uL (ref 0.0–0.1)
EOS ABS: 0.2 10*3/uL (ref 0.0–0.7)
Eosinophils Relative: 3 %
HCT: 28.3 % — ABNORMAL LOW (ref 36.0–46.0)
Hemoglobin: 8.7 g/dL — ABNORMAL LOW (ref 12.0–15.0)
Immature Granulocytes: 1 %
Lymphocytes Relative: 6 %
Lymphs Abs: 0.5 10*3/uL — ABNORMAL LOW (ref 0.7–4.0)
MCH: 29.6 pg (ref 26.0–34.0)
MCHC: 30.7 g/dL (ref 30.0–36.0)
MCV: 96.3 fL (ref 78.0–100.0)
MONO ABS: 0.7 10*3/uL (ref 0.1–1.0)
MONOS PCT: 8 %
NEUTROS ABS: 7.1 10*3/uL (ref 1.7–7.7)
Neutrophils Relative %: 81 %
PLATELETS: 250 10*3/uL (ref 150–400)
RBC: 2.94 MIL/uL — ABNORMAL LOW (ref 3.87–5.11)
RDW: 18.3 % — ABNORMAL HIGH (ref 11.5–15.5)
WBC: 8.6 10*3/uL (ref 4.0–10.5)

## 2018-04-17 LAB — COMPREHENSIVE METABOLIC PANEL
ALT: 7 U/L (ref 0–44)
AST: 15 U/L (ref 15–41)
Albumin: 2.4 g/dL — ABNORMAL LOW (ref 3.5–5.0)
Alkaline Phosphatase: 43 U/L (ref 38–126)
Anion gap: 13 (ref 5–15)
BUN: 25 mg/dL — AB (ref 8–23)
CO2: 18 mmol/L — ABNORMAL LOW (ref 22–32)
CREATININE: 2.18 mg/dL — AB (ref 0.44–1.00)
Calcium: 8.9 mg/dL (ref 8.9–10.3)
Chloride: 110 mmol/L (ref 98–111)
GFR calc Af Amer: 26 mL/min — ABNORMAL LOW (ref >60)
GFR calc non Af Amer: 23 mL/min — ABNORMAL LOW (ref >60)
Glucose, Bld: 121 mg/dL — ABNORMAL HIGH (ref 70–99)
POTASSIUM: 4.3 mmol/L (ref 3.5–5.1)
Sodium: 141 mmol/L (ref 135–145)
Total Bilirubin: 0.9 mg/dL (ref 0.3–1.2)
Total Protein: 5.3 g/dL — ABNORMAL LOW (ref 6.5–8.1)

## 2018-04-17 LAB — LIPASE, BLOOD: Lipase: 203 U/L — ABNORMAL HIGH (ref 11–51)

## 2018-04-17 LAB — BRAIN NATRIURETIC PEPTIDE: B NATRIURETIC PEPTIDE 5: 2002.3 pg/mL — AB (ref 0.0–100.0)

## 2018-04-17 MED ORDER — FUROSEMIDE 10 MG/ML IJ SOLN
60.0000 mg | Freq: Two times a day (BID) | INTRAMUSCULAR | Status: AC
  Administered 2018-04-17 – 2018-04-18 (×3): 60 mg via INTRAVENOUS
  Filled 2018-04-17 (×4): qty 6

## 2018-04-17 MED ORDER — FUROSEMIDE 10 MG/ML IJ SOLN
40.0000 mg | Freq: Two times a day (BID) | INTRAMUSCULAR | Status: DC
  Administered 2018-04-17: 40 mg via INTRAVENOUS
  Filled 2018-04-17: qty 4

## 2018-04-17 NOTE — Plan of Care (Signed)
  Problem: Clinical Measurements: Goal: Ability to maintain clinical measurements within normal limits will improve Outcome: Progressing   Problem: Nutrition: Goal: Adequate nutrition will be maintained Outcome: Not Progressing   

## 2018-04-17 NOTE — Progress Notes (Signed)
Patient ID: [name], female   DOB: [birth year], 67 y.o.   MRN: [id number]    5 Days Post-Op  Subjective: Pt feels well today.  No complaints.  Some back pain, but no abdominal pain like she has been having.  No nausea  Objective: Vital signs in last 24 hours: Temp:  [97.4 F (36.3 C)-98.3 F (36.8 C)] 97.4 F (36.3 C) (08/12 0749) Pulse Rate:  [58-66] 58 (08/12 0749) Resp:  [18] 18 (08/12 0749) BP: (124-174)/(47-69) 156/48 (08/12 0749) SpO2:  [92 %-98 %] 98 % (08/12 0749) Weight:  [67.2 kg] 67.2 kg (08/12 0500) Last BM Date: 04/14/18  Intake/Output from previous day: 08/11 0701 - 08/12 0700 In: 1082.4 [I.V.:982.4; IV Piggyback:100] Out: -  Intake/Output this shift: No intake/output data recorded.  PE: Heart: regular Lungs: CTAB Abd: soft, NT, except minimally over RUQ, +BS, ND Ext: +2 pitting edema of BLE  Lab Results:  Recent Labs    04/16/18 0610 04/17/18 0534  WBC 8.9 8.6  HGB 8.5* 8.7*  HCT 29.1* 28.3*  PLT 233 250   BMET Recent Labs    04/15/18 0529 04/16/18 0610  NA 142 141  K 3.7 4.8  CL 114* 107  CO2 20* 17*  GLUCOSE 78 69*  BUN 24* 29*  CREATININE 2.01* 2.54*  CALCIUM 6.8* 8.6*   PT/INR No results for input(s): LABPROT, INR in the last 72 hours. CMP     Component Value Date/Time   NA 141 04/16/2018 0610   K 4.8 04/16/2018 0610   CL 107 04/16/2018 0610   CO2 17 (L) 04/16/2018 0610   GLUCOSE 69 (L) 04/16/2018 0610   BUN 29 (H) 04/16/2018 0610   CREATININE 2.54 (H) 04/16/2018 0610   CREATININE 1.32 (H) 06/17/2015 1257   CALCIUM 8.6 (L) 04/16/2018 0610   PROT 5.0 (L) 04/16/2018 0610   ALBUMIN 2.3 (L) 04/16/2018 0610   AST 12 (L) 04/16/2018 0610   ALT 7 04/16/2018 0610   ALKPHOS 41 04/16/2018 0610   BILITOT 1.1 04/16/2018 0610   GFRNONAA 19 (L) 04/16/2018 0610   GFRAA 22 (L) 04/16/2018 0610   Lipase     Component Value Date/Time   LIPASE 203 (H) 04/17/2018 0534       Studies/Results: No results  found.  Anti-infectives: Anti-infectives (From admission, onward)   Start     Dose/Rate Route Frequency Ordered Stop   04/14/18 1100  cefTRIAXone (ROCEPHIN) 2 g in sodium chloride 0.9 % 100 mL IVPB    Note to Pharmacy:  Pharmacy may adjust dosing strength / duration / interval for maximal efficacy - dose checked and appropriate   2 g 200 mL/hr over 30 Minutes Intravenous Every 24 hours 04/14/18 1018         Assessment/Plan Acute gallstone pancreatitis.  Clinically resolving.  Moderate inflammatory changes around the head of pancreas and duodenum on CT -no abdominal pain today.   -PCXR. EKG to assure stability prior to surgical procedure.  She did have GETA in May and tolerated that well.  Unless concerning findings on above studies does not necessarily need cards to see prior to surgery.   -procedure discussed with patient along with risks and complications.  She knows because of her previous cardiopulmonary issues that she is higher risk for operative and postoperative complications.   I have explained the procedure, risks, and aftercare of cholecystectomy.  Risks include but are not limited to bleeding, infection, wound problems, anesthesia, diarrhea, bile leak, injury to common bile duct/liver/intestine.  She seems to understand and agrees to proceed.   Gallstones, but no evidence of acute cholecystitis radiographically   CAD status post CABG 2012 AAA repair 2019-endovascular stent COPD - 2L O2 at night Chronic back pain CKD  - stable CHF - appears to have some fluid overload given edema in her legs.  Doesn't sound wet in lungs.  May need some lasix.  Only on 50cc/hr of IVFs Appendectomy 2016  FEN - NPO VTE - SCDs/None otherwise, plavix on hold ID - Rocephin   LOS: 5 days    Henreitta Cea , [facility] 04/17/2018, 8:55 AM Pager: [phone]
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
## 2018-04-17 NOTE — Care Management Important Message (Signed)
Important Message  Patient Details  Name: [name] MRN: [id number] Date of Birth: Nov 08, 1950   Medicare Important Message Given:  Yes  Signed on 04/14/2018  [name] 04/17/2018, 8:14 AM

## 2018-04-18 ENCOUNTER — Inpatient Hospital Stay (HOSPITAL_COMMUNITY): Payer: Medicare Other

## 2018-04-18 LAB — CBC WITH DIFFERENTIAL/PLATELET
Abs Immature Granulocytes: 0.1 10*3/uL (ref 0.0–0.1)
BASOS ABS: 0.1 10*3/uL (ref 0.0–0.1)
BASOS PCT: 1 %
EOS ABS: 0.2 10*3/uL (ref 0.0–0.7)
EOS PCT: 3 %
HEMATOCRIT: 26.8 % — AB (ref 36.0–46.0)
Hemoglobin: 8.1 g/dL — ABNORMAL LOW (ref 12.0–15.0)
Immature Granulocytes: 1 %
Lymphocytes Relative: 6 %
Lymphs Abs: 0.5 10*3/uL — ABNORMAL LOW (ref 0.7–4.0)
MCH: 29 pg (ref 26.0–34.0)
MCHC: 30.2 g/dL (ref 30.0–36.0)
MCV: 96.1 fL (ref 78.0–100.0)
Monocytes Absolute: 0.7 10*3/uL (ref 0.1–1.0)
Monocytes Relative: 8 %
Neutro Abs: 6.6 10*3/uL (ref 1.7–7.7)
Neutrophils Relative %: 81 %
PLATELETS: 236 10*3/uL (ref 150–400)
RBC: 2.79 MIL/uL — AB (ref 3.87–5.11)
RDW: 18 % — AB (ref 11.5–15.5)
WBC: 8.2 10*3/uL (ref 4.0–10.5)

## 2018-04-18 LAB — BASIC METABOLIC PANEL
Anion gap: 10 (ref 5–15)
BUN: 20 mg/dL (ref 8–23)
CALCIUM: 9 mg/dL (ref 8.9–10.3)
CO2: 25 mmol/L (ref 22–32)
CREATININE: 2.12 mg/dL — AB (ref 0.44–1.00)
Chloride: 105 mmol/L (ref 98–111)
GFR, EST AFRICAN AMERICAN: 27 mL/min — AB (ref >60)
GFR, EST NON AFRICAN AMERICAN: 23 mL/min — AB (ref >60)
Glucose, Bld: 126 mg/dL — ABNORMAL HIGH (ref 70–99)
Potassium: 3.9 mmol/L (ref 3.5–5.1)
SODIUM: 140 mmol/L (ref 135–145)

## 2018-04-18 LAB — BRAIN NATRIURETIC PEPTIDE: B NATRIURETIC PEPTIDE 5: 1099.7 pg/mL — AB (ref 0.0–100.0)

## 2018-04-18 LAB — LIPASE, BLOOD: LIPASE: 256 U/L — AB (ref 11–51)

## 2018-04-18 MED ORDER — IPRATROPIUM-ALBUTEROL 0.5-2.5 (3) MG/3ML IN SOLN
3.0000 mL | Freq: Four times a day (QID) | RESPIRATORY_TRACT | Status: DC
  Administered 2018-04-18: 3 mL via RESPIRATORY_TRACT
  Filled 2018-04-18: qty 3

## 2018-04-18 MED ORDER — IOPAMIDOL (ISOVUE-300) INJECTION 61%
INTRAVENOUS | Status: AC
Start: 2018-04-18 — End: 2018-04-18
  Filled 2018-04-18: qty 30

## 2018-04-18 MED ORDER — IPRATROPIUM-ALBUTEROL 0.5-2.5 (3) MG/3ML IN SOLN
3.0000 mL | Freq: Three times a day (TID) | RESPIRATORY_TRACT | Status: DC
  Administered 2018-04-19 (×3): 3 mL via RESPIRATORY_TRACT
  Filled 2018-04-18 (×3): qty 3

## 2018-04-18 MED ORDER — BUDESONIDE 0.25 MG/2ML IN SUSP
0.2500 mg | Freq: Two times a day (BID) | RESPIRATORY_TRACT | Status: DC
  Administered 2018-04-18 – 2018-04-21 (×6): 0.25 mg via RESPIRATORY_TRACT
  Filled 2018-04-18 (×6): qty 2
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
## 2018-04-18 NOTE — Progress Notes (Signed)
Physical Therapy Treatment Patient Details Name: [name] MRN: [id number] DOB: 1951-02-23 Today's Date: 04/18/2018    History of Present Illness 67 year old woman with medical problems including chronic kidney disease stage 5, CAD status post CABG in 2012, repair of abdominal aortic aneurysm in May 2019, COPD, nocturnal use of oxygen, chronic lumbar back pain, and hypertension presenting with weakness.    PT Comments    Pt performed gait without device and performed poorly with mild LOB.  Will plan for use of device next session to improve function.  Pt with noticeable DOE and O2 sats decreased to 87% on RA, informed nursing.  With seated rest break O2 sats improved to 94%.  Plan for HHPT remains appropriate based on need for improved function and continued strengthening.      Follow Up Recommendations  Home health PT     Equipment Recommendations  None recommended by PT    Recommendations for Other Services       Precautions / Restrictions Precautions Precautions: Fall Restrictions Weight Bearing Restrictions: No    Mobility  Bed Mobility Overal bed mobility: Needs Assistance Bed Mobility: Sit to Supine       Sit to supine: Supervision   General bed mobility comments: Increased time and effort supervisiong for safety.     Transfers Overall transfer level: Needs assistance Equipment used: None Transfers: Sit to/from Stand Sit to Stand: Min guard         General transfer comment: Cues for hand placement and weight shifitng.  Pt mildly unsteady in standing.  Pt required min guard to steady in standing.    Ambulation/Gait Ambulation/Gait assistance: Min assist Gait Distance (Feet): 200 Feet Assistive device: None Gait Pattern/deviations: Step-through pattern;Trunk flexed;Staggering right;Drifts right/left     General Gait Details: Pt with multiple minor LOB with decreased stride length and decreased reciprocal armswing.  Pt slow and guarded.  Cues to  increase armswing.  Min assistance to correct and maintain balance.     Stairs             Wheelchair Mobility    Modified Rankin (Stroke Patients Only)       Balance Overall balance assessment: Needs assistance   Sitting balance-Leahy Scale: Normal       Standing balance-Leahy Scale: Fair                              Cognition Arousal/Alertness: Awake/alert Behavior During Therapy: WFL for tasks assessed/performed Overall Cognitive Status: Within Functional Limits for tasks assessed                                        Exercises General Exercises - Lower Extremity Hip Flexion/Marching: AROM;Both;10 reps;Standing Heel Raises: AROM;Both;10 reps;Standing Mini-Sqauts: AROM;Both;10 reps;Standing    General Comments        Pertinent Vitals/Pain Pain Assessment: Faces Faces Pain Scale: Hurts little more Pain Location: abdomen Pain Descriptors / Indicators: Discomfort Pain Intervention(s): Monitored during session;Repositioned    Home Living                      Prior Function            PT Goals (current goals can now be found in the care plan section) Acute Rehab PT Goals Patient Stated Goal: To have this CT scan Potential to Achieve Goals:  Good Progress towards PT goals: Progressing toward goals    Frequency    Min 3X/week      PT Plan Current plan remains appropriate    Co-evaluation              AM-PAC PT "6 Clicks" Daily Activity  Outcome Measure  Difficulty turning over in bed (including adjusting bedclothes, sheets and blankets)?: None Difficulty moving from lying on back to sitting on the side of the bed? : None Difficulty sitting down on and standing up from a chair with arms (e.g., wheelchair, bedside commode, etc,.)?: A Little Help needed moving to and from a bed to chair (including a wheelchair)?: A Little Help needed walking in hospital room?: A Little Help needed climbing 3-5 steps  with a railing? : A Little 6 Click Score: 20    End of Session Equipment Utilized During Treatment: Gait belt Activity Tolerance: Patient tolerated treatment well Patient left: (sitting edge of bed to finish drinking contrast.  ) Nurse Communication: Mobility status PT Visit Diagnosis: Unsteadiness on feet (R26.81);Difficulty in walking, not elsewhere classified (R26.2);Muscle weakness (generalized) (M62.81)     Time: 1017-5102 PT Time Calculation (min) (ACUTE ONLY): 13 min  Charges:  $Gait Training: 8-22 mins                     Governor Rooks, PTA pager [phone]    Cristela Blue 04/18/2018, 12:47 PM

## 2018-04-18 NOTE — Progress Notes (Signed)
6 Days Post-Op   Subjective/Chief Complaint: Breathing better today, more pain in ruq/epigastrium with lipase up a little today also   Objective: Vital signs in last 24 hours: Temp:  [97.8 F (36.6 C)-98.4 F (36.9 C)] 98.4 F (36.9 C) (08/13 0500) Pulse Rate:  [62-65] 65 (08/13 0500) Resp:  [16] 16 (08/12 1342) BP: (152-178)/(54-78) 178/54 (08/13 0500) SpO2:  [93 %-98 %] 93 % (08/13 0500) Weight:  [63.9 kg] 63.9 kg (08/13 0500) Last BM Date: 04/14/18  Intake/Output from previous day: 08/12 0701 - 08/13 0700 In: 523 [P.O.:520; I.V.:3] Out: 1050 [Urine:1050] Intake/Output this shift: No intake/output data recorded.  GI: soft tender ruq and epigastrium no murphys sign  Lab Results:  Recent Labs    04/17/18 0534 04/18/18 0501  WBC 8.6 8.2  HGB 8.7* 8.1*  HCT 28.3* 26.8*  PLT 250 236   BMET Recent Labs    04/17/18 0534 04/18/18 0501  NA 141 140  K 4.3 3.9  CL 110 105  CO2 18* 25  GLUCOSE 121* 126*  BUN 25* 20  CREATININE 2.18* 2.12*  CALCIUM 8.9 9.0   PT/INR No results for input(s): LABPROT, INR in the last 72 hours. ABG No results for input(s): PHART, HCO3 in the last 72 hours.  Invalid input(s): PCO2, PO2  Studies/Results: Dg Chest Port 1 View  Result Date: 04/18/2018 CLINICAL DATA:  Pulmonary edema. EXAM: PORTABLE CHEST 1 VIEW COMPARISON:  04/17/2018. FINDINGS: Prior CABG and cardiac valve replacement. Left atrial appendage clip noted. Cardiomegaly with diffuse bilateral pulmonary interstitial prominence. Pulmonary interstitial prominence is improved from prior exam. Small bilateral pleural effusions again noted. No pneumothorax. IMPRESSION: Prior CABG and cardiac valve replacement. Stable cardiomegaly. Interim improvement of interstitial prominence. Tiny bilateral pleural effusions. Findings suggest improving CHF. Electronically Signed   By: Marcello Moores  Register   On: 04/18/2018 06:59   Dg Chest Port 1 View  Result Date: 04/17/2018 CLINICAL DATA:   Preoperative respiratory examination. EXAM: PORTABLE CHEST 1 VIEW COMPARISON:  01/22/2018 chest radiograph FINDINGS: Cardiomegaly, CABG/mitral valve replacement and LEFT atrial appendage clip again noted. Mild interstitial pulmonary edema and trace bilateral pleural effusions are noted. There is no evidence of pneumothorax. No acute bony abnormalities are present. IMPRESSION: Cardiomegaly with mild interstitial pulmonary edema and trace bilateral pleural effusions. Electronically Signed   By: Margarette Canada M.D.   On: 04/17/2018 09:24    Anti-infectives: Anti-infectives (From admission, onward)   Start     Dose/Rate Route Frequency Ordered Stop   04/14/18 1100  cefTRIAXone (ROCEPHIN) 2 g in sodium chloride 0.9 % 100 mL IVPB    Note to Pharmacy:  Pharmacy may adjust dosing strength / duration / interval for maximal efficacy - dose checked and appropriate   2 g 200 mL/hr over 30 Minutes Intravenous Every 24 hours 04/14/18 1018        Assessment/Plan: Acute gallstone pancreatitis.Clinically resolving. Moderate inflammatory changes around the head of pancreas and duodenum on CT -PCXR. EKG both ok. Except for pulmonary edema. She did have GETA in May and tolerated that well.  Unless concerning findings on above studies does not necessarily need cards to see prior to surgery.  she has been diuresed since yesterday. She has worse pain (she had none yesterday) associated with lipase up.  I am going to get ct today to make sure this isnt from what is a significant pancreatitis.  If not will proceed with lap chole but otherwise may need to wait. I discussed this plan with she and her  family CAD status post CABG 2012 AAA repair 2019-endovascular stent COPD - 2L O2 at night Chronic back pain CKD  - stable CHF - continue to get volume off, bnp better FEN - NPO VTE - SCDs/None otherwise, plavix on hold, she can have sq heparin from my standpoint and would recommend ID - Rocephin  Rolm Bookbinder 04/18/2018

## 2018-04-19 ENCOUNTER — Inpatient Hospital Stay (HOSPITAL_COMMUNITY): Payer: Medicare Other | Admitting: Anesthesiology

## 2018-04-19 ENCOUNTER — Encounter (HOSPITAL_COMMUNITY): Payer: Self-pay | Admitting: Orthopedic Surgery

## 2018-04-19 ENCOUNTER — Encounter (HOSPITAL_COMMUNITY)
Admission: EM | Disposition: A | Discharge status: Home Health | Payer: Self-pay | Source: Home / Self Care | Attending: Internal Medicine

## 2018-04-19 DIAGNOSIS — K851 Biliary acute pancreatitis without necrosis or infection: Secondary | ICD-10-CM

## 2018-04-19 DIAGNOSIS — D649 Anemia, unspecified: Secondary | ICD-10-CM

## 2018-04-19 DIAGNOSIS — I714 Abdominal aortic aneurysm, without rupture: Secondary | ICD-10-CM

## 2018-04-19 DIAGNOSIS — E8770 Fluid overload, unspecified: Secondary | ICD-10-CM

## 2018-04-19 DIAGNOSIS — J438 Other emphysema: Secondary | ICD-10-CM

## 2018-04-19 DIAGNOSIS — K922 Gastrointestinal hemorrhage, unspecified: Secondary | ICD-10-CM

## 2018-04-19 HISTORY — PX: CHOLECYSTECTOMY: SHX55

## 2018-04-19 LAB — CBC WITH DIFFERENTIAL/PLATELET
ABS IMMATURE GRANULOCYTES: 0.1 10*3/uL (ref 0.0–0.1)
Basophils Absolute: 0.1 10*3/uL (ref 0.0–0.1)
Basophils Relative: 1 %
Eosinophils Absolute: 0.3 10*3/uL (ref 0.0–0.7)
Eosinophils Relative: 3 %
HEMATOCRIT: 27.5 % — AB (ref 36.0–46.0)
HEMOGLOBIN: 8.4 g/dL — AB (ref 12.0–15.0)
IMMATURE GRANULOCYTES: 1 %
LYMPHS ABS: 0.4 10*3/uL — AB (ref 0.7–4.0)
LYMPHS PCT: 5 %
MCH: 29.1 pg (ref 26.0–34.0)
MCHC: 30.5 g/dL (ref 30.0–36.0)
MCV: 95.2 fL (ref 78.0–100.0)
Monocytes Absolute: 0.5 10*3/uL (ref 0.1–1.0)
Monocytes Relative: 6 %
NEUTROS PCT: 84 %
Neutro Abs: 6.7 10*3/uL (ref 1.7–7.7)
Platelets: 228 10*3/uL (ref 150–400)
RBC: 2.89 MIL/uL — AB (ref 3.87–5.11)
RDW: 17.7 % — ABNORMAL HIGH (ref 11.5–15.5)
WBC: 8 10*3/uL (ref 4.0–10.5)

## 2018-04-19 LAB — COMPREHENSIVE METABOLIC PANEL
ALBUMIN: 2.3 g/dL — AB (ref 3.5–5.0)
ALK PHOS: 47 U/L (ref 38–126)
ALT: 8 U/L (ref 0–44)
AST: 12 U/L — ABNORMAL LOW (ref 15–41)
Anion gap: 13 (ref 5–15)
BUN: 21 mg/dL (ref 8–23)
CALCIUM: 9.1 mg/dL (ref 8.9–10.3)
CO2: 24 mmol/L (ref 22–32)
Chloride: 102 mmol/L (ref 98–111)
Creatinine, Ser: 2.13 mg/dL — ABNORMAL HIGH (ref 0.44–1.00)
GFR calc Af Amer: 27 mL/min — ABNORMAL LOW (ref >60)
GFR calc non Af Amer: 23 mL/min — ABNORMAL LOW (ref >60)
GLUCOSE: 110 mg/dL — AB (ref 70–99)
Potassium: 3.9 mmol/L (ref 3.5–5.1)
Sodium: 139 mmol/L (ref 135–145)
Total Bilirubin: 1.2 mg/dL (ref 0.3–1.2)
Total Protein: 5.1 g/dL — ABNORMAL LOW (ref 6.5–8.1)

## 2018-04-19 LAB — LIPASE, BLOOD: Lipase: 90 U/L — ABNORMAL HIGH (ref 11–51)

## 2018-04-19 LAB — GLUCOSE, CAPILLARY: GLUCOSE-CAPILLARY: 108 mg/dL — AB (ref 70–99)

## 2018-04-19 LAB — SURGICAL PCR SCREEN
MRSA, PCR: NEGATIVE
Staphylococcus aureus: NEGATIVE

## 2018-04-19 SURGERY — LAPAROSCOPIC CHOLECYSTECTOMY WITH INTRAOPERATIVE CHOLANGIOGRAM
Anesthesia: General | Site: Abdomen

## 2018-04-19 MED ORDER — 0.9 % SODIUM CHLORIDE (POUR BTL) OPTIME
TOPICAL | Status: DC | PRN
  Administered 2018-04-19: 1000 mL

## 2018-04-19 MED ORDER — BUPIVACAINE-EPINEPHRINE 0.25% -1:200000 IJ SOLN
INTRAMUSCULAR | Status: DC | PRN
  Administered 2018-04-19: 9 mL

## 2018-04-19 MED ORDER — ROCURONIUM BROMIDE 10 MG/ML (PF) SYRINGE
PREFILLED_SYRINGE | INTRAVENOUS | Status: DC | PRN
  Administered 2018-04-19: 60 mg via INTRAVENOUS

## 2018-04-19 MED ORDER — SODIUM CHLORIDE 0.9 % IV SOLN
INTRAVENOUS | Status: DC | PRN
  Administered 2018-04-19: 40 ug/min via INTRAVENOUS

## 2018-04-19 MED ORDER — SUGAMMADEX SODIUM 200 MG/2ML IV SOLN
INTRAVENOUS | Status: DC | PRN
  Administered 2018-04-19: 250 mg via INTRAVENOUS

## 2018-04-19 MED ORDER — MIDAZOLAM HCL 2 MG/2ML IJ SOLN
INTRAMUSCULAR | Status: DC | PRN
  Administered 2018-04-19: 1 mg via INTRAVENOUS

## 2018-04-19 MED ORDER — PROPOFOL 10 MG/ML IV BOLUS
INTRAVENOUS | Status: DC | PRN
  Administered 2018-04-19: 120 mg via INTRAVENOUS

## 2018-04-19 MED ORDER — LIDOCAINE 2% (20 MG/ML) 5 ML SYRINGE
INTRAMUSCULAR | Status: AC
  Filled 2018-04-19: qty 5

## 2018-04-19 MED ORDER — LIDOCAINE 2% (20 MG/ML) 5 ML SYRINGE
INTRAMUSCULAR | Status: DC | PRN
  Administered 2018-04-19: 80 mg via INTRAVENOUS

## 2018-04-19 MED ORDER — SODIUM CHLORIDE 0.9 % IR SOLN
Status: DC | PRN
  Administered 2018-04-19: 1000 mL

## 2018-04-19 MED ORDER — ACETAMINOPHEN 325 MG PO TABS
650.0000 mg | ORAL_TABLET | Freq: Four times a day (QID) | ORAL | Status: DC
  Administered 2018-04-19 – 2018-04-20 (×3): 650 mg via ORAL
  Filled 2018-04-19 (×4): qty 2

## 2018-04-19 MED ORDER — PROMETHAZINE HCL 25 MG/ML IJ SOLN: 6.2500 mg | INTRAMUSCULAR | Status: DC | PRN

## 2018-04-19 MED ORDER — BUPIVACAINE-EPINEPHRINE (PF) 0.25% -1:200000 IJ SOLN
INTRAMUSCULAR | Status: AC
  Filled 2018-04-19: qty 30

## 2018-04-19 MED ORDER — FENTANYL CITRATE (PF) 250 MCG/5ML IJ SOLN
INTRAMUSCULAR | Status: AC
  Filled 2018-04-19: qty 5

## 2018-04-19 MED ORDER — HYDROMORPHONE HCL 1 MG/ML IJ SOLN: 0.2500 mg | INTRAMUSCULAR | Status: DC | PRN

## 2018-04-19 MED ORDER — ONDANSETRON HCL 4 MG/2ML IJ SOLN
INTRAMUSCULAR | Status: AC
  Filled 2018-04-19: qty 2

## 2018-04-19 MED ORDER — SODIUM CHLORIDE 0.9 % IV SOLN
INTRAVENOUS | Status: DC
  Administered 2018-04-19 (×2): via INTRAVENOUS

## 2018-04-19 MED ORDER — MIDAZOLAM HCL 2 MG/2ML IJ SOLN
INTRAMUSCULAR | Status: AC
  Filled 2018-04-19: qty 2

## 2018-04-19 MED ORDER — FENTANYL CITRATE (PF) 100 MCG/2ML IJ SOLN
INTRAMUSCULAR | Status: DC | PRN
  Administered 2018-04-19: 100 ug via INTRAVENOUS

## 2018-04-19 MED ORDER — IOPAMIDOL (ISOVUE-300) INJECTION 61%
INTRAVENOUS | Status: AC
  Filled 2018-04-19: qty 50

## 2018-04-19 MED ORDER — ONDANSETRON HCL 4 MG/2ML IJ SOLN
INTRAMUSCULAR | Status: DC | PRN
  Administered 2018-04-19: 4 mg via INTRAVENOUS

## 2018-04-19 SURGICAL SUPPLY — 42 items
APPLIER CLIP 5 13 M/L LIGAMAX5 (MISCELLANEOUS) ×3
BLADE CLIPPER SURG (BLADE) IMPLANT
CANISTER SUCT 3000ML PPV (MISCELLANEOUS) ×3 IMPLANT
CHLORAPREP W/TINT 26ML (MISCELLANEOUS) ×3 IMPLANT
CLIP APPLIE 5 13 M/L LIGAMAX5 (MISCELLANEOUS) ×1 IMPLANT
CLOSURE WOUND 1/2 X4 (GAUZE/BANDAGES/DRESSINGS) ×1
CLOSURE WOUND 1/4X4 (GAUZE/BANDAGES/DRESSINGS) ×1
COVER MAYO STAND STRL (DRAPES) ×3 IMPLANT
COVER SURGICAL LIGHT HANDLE (MISCELLANEOUS) ×3 IMPLANT
DERMABOND ADVANCED (GAUZE/BANDAGES/DRESSINGS) ×2
DERMABOND ADVANCED .7 DNX12 (GAUZE/BANDAGES/DRESSINGS) ×1 IMPLANT
DRAPE C-ARM 42X72 X-RAY (DRAPES) ×3 IMPLANT
ELECT REM PT RETURN 9FT ADLT (ELECTROSURGICAL) ×3
ELECTRODE REM PT RTRN 9FT ADLT (ELECTROSURGICAL) ×1 IMPLANT
GLOVE BIO SURGEON STRL SZ7 (GLOVE) ×3 IMPLANT
GLOVE BIOGEL PI IND STRL 7.5 (GLOVE) ×1 IMPLANT
GLOVE BIOGEL PI INDICATOR 7.5 (GLOVE) ×2
GOWN STRL REUS W/ TWL LRG LVL3 (GOWN DISPOSABLE) ×3 IMPLANT
GOWN STRL REUS W/TWL LRG LVL3 (GOWN DISPOSABLE) ×6
GRASPER SUT TROCAR 14GX15 (MISCELLANEOUS) ×3 IMPLANT
KIT BASIN OR (CUSTOM PROCEDURE TRAY) ×3 IMPLANT
KIT TURNOVER KIT B (KITS) ×3 IMPLANT
NS IRRIG 1000ML POUR BTL (IV SOLUTION) ×3 IMPLANT
PAD ARMBOARD 7.5X6 YLW CONV (MISCELLANEOUS) ×3 IMPLANT
POUCH RETRIEVAL ECOSAC 10 (ENDOMECHANICALS) ×1 IMPLANT
POUCH RETRIEVAL ECOSAC 10MM (ENDOMECHANICALS) ×2
SCISSORS LAP 5X35 DISP (ENDOMECHANICALS) ×3 IMPLANT
SET CHOLANGIOGRAPH 5 50 .035 (SET/KITS/TRAYS/PACK) ×3 IMPLANT
SET IRRIG TUBING LAPAROSCOPIC (IRRIGATION / IRRIGATOR) ×3 IMPLANT
SLEEVE ENDOPATH XCEL 5M (ENDOMECHANICALS) ×6 IMPLANT
SPECIMEN JAR SMALL (MISCELLANEOUS) ×3 IMPLANT
STRIP CLOSURE SKIN 1/2X4 (GAUZE/BANDAGES/DRESSINGS) ×2 IMPLANT
STRIP CLOSURE SKIN 1/4X4 (GAUZE/BANDAGES/DRESSINGS) ×2 IMPLANT
SUT MNCRL AB 4-0 PS2 18 (SUTURE) ×3 IMPLANT
SUT VICRYL 0 UR6 27IN ABS (SUTURE) ×3 IMPLANT
TOWEL OR 17X24 6PK STRL BLUE (TOWEL DISPOSABLE) ×3 IMPLANT
TOWEL OR 17X26 10 PK STRL BLUE (TOWEL DISPOSABLE) ×3 IMPLANT
TRAY LAPAROSCOPIC MC (CUSTOM PROCEDURE TRAY) ×3 IMPLANT
TROCAR XCEL BLUNT TIP 100MML (ENDOMECHANICALS) ×3 IMPLANT
TROCAR XCEL NON-BLD 5MMX100MML (ENDOMECHANICALS) ×3 IMPLANT
TUBING INSUFFLATION (TUBING) ×3 IMPLANT
WATER STERILE IRR 1000ML POUR (IV SOLUTION) ×3 IMPLANT

## 2018-04-19 NOTE — Op Note (Signed)
Preoperative diagnosis: gallstone pancreatitis Postoperative diagnosis: saa Procedure: Laparoscopic cholecystectomy Surgeon: Dr. Serita Grammes Anesthesia: General Estimated blood loss: 20 cc Specimens: Gallbladder and contents to pathology Complications: None Drains: none Sponge needle count was correct at completion Disposition to recovery in stable condition  Indications: This is a 3 yof who presents with gallstone pancreatitis that has resolved. I discussed lap chole with patient and her family.   Procedure: After informed consent she was taken to the OR.   She had been given antibiotics.  SCDs were in place.  She was prepped and draped in the standard sterile surgical fashion.  A surgical timeout was then performed.  I infiltrated Marcaine below the umbilicus.  I made a vertical incision.  I then incised the fascia and inserted a Hassan trocar with a 0 Vicryl pursestring to hold it in place.  I then insufflated the abdomen to 15 mmHg pressure.  I then inserted 3 further 5 mm trocars in the epigastrium and right side of the abdomen under direct vision without complication.  I then was able to identify the gallbladder.   I was able to retract it cephalad and lateral.  I was able to dissect the triangle of calot. I identified the critical view of safety.  I then clipped the cystic duct and divided it. I left three clips in place. The duct was viable and the clips traversed the duct.  I then treated the cystic artery in a similar fashion.  I then removed the gallbladder from the liver. I then placed the gallbladder in the retrieval bag and removed it.  I evacuated as many of the stones as well as fluid as I could.  I then cauterized the entire liver bed.   I then was able to remove the [facility] trocar and tied my pursestring down.  I placed an additional Vicryl suture to completely obliterate the defect.  I then removed my epigastric trocar and used the suture passer to tie this down with another 0  Vicryl suture.  The abdomen was then desufflated.  The incisions were then closed with 4-0 Monocryl and glue.  She tolerated this well was extubated and transferred to the recovery in stable condition.

## 2018-04-19 NOTE — Transfer of Care (Signed)
Immediate Anesthesia Transfer of Care Note  Patient: [name]  Procedure(s) Performed: LAPAROSCOPIC CHOLECYSTECTOMY WITH INTRAOPERATIVE CHOLANGIOGRAM (N/A Abdomen)  Patient Location: PACU  Anesthesia Type:General  Level of Consciousness: awake, alert  and oriented  Airway & Oxygen Therapy: Patient Spontanous Breathing and Patient connected to face mask oxygen  Post-op Assessment: Report given to RN, Post -op Vital signs reviewed and stable and Patient moving all extremities  Post vital signs: Reviewed and stable  Last Vitals:  Vitals Value Taken Time  BP 169/52 04/19/2018 12:24 PM  Temp 36.2 C 04/19/2018 12:24 PM  Pulse 67 04/19/2018 12:27 PM  Resp 14 04/19/2018 12:27 PM  SpO2 92 % 04/19/2018 12:27 PM  Vitals shown include unvalidated device data.  Last Pain:  Vitals:   04/19/18 0725  TempSrc:   PainSc: 4       Patients Stated Pain Goal: 4 (11/55/20 8022)  Complications: No apparent anesthesia complications

## 2018-04-19 NOTE — Anesthesia Procedure Notes (Signed)
Procedure Name: Intubation Date/Time: 04/19/2018 11:16 AM Performed by: Leonor Liv, CRNA Pre-anesthesia Checklist: Patient identified, Emergency Drugs available, Suction available and Patient being monitored Patient Re-evaluated:Patient Re-evaluated prior to induction Oxygen Delivery Method: Circle System Utilized Preoxygenation: Pre-oxygenation with 100% oxygen Induction Type: IV induction Ventilation: Mask ventilation without difficulty and Oral airway inserted - appropriate to patient size Laryngoscope Size: Mac and 3 Grade View: Grade I Tube type: Oral Tube size: 7.0 mm Number of attempts: 1 Airway Equipment and Method: Stylet and Oral airway Placement Confirmation: ETT inserted through vocal cords under direct vision,  positive ETCO2 and breath sounds checked- equal and bilateral Secured at: 21 cm Tube secured with: Tape Dental Injury: Teeth and Oropharynx as per pre-operative assessment

## 2018-04-19 NOTE — Anesthesia Preprocedure Evaluation (Signed)
Anesthesia Evaluation  Patient identified by MRN, date of birth, ID band Patient awake    Reviewed: Allergy & Precautions, NPO status , Patient's Chart, lab work & pertinent test results  Airway Mallampati: II  TM Distance: >3 FB Neck ROM: Full    Dental  (+) Edentulous Upper, Edentulous Lower   Pulmonary COPD,  COPD inhaler and oxygen dependent, Current Smoker,  2L [place] at night   Pulmonary exam normal breath sounds clear to auscultation       Cardiovascular hypertension, Pt. on medications and Pt. on home beta blockers + CAD, + Past MI, + Cardiac Stents, + CABG and +CHF  Normal cardiovascular exam+ dysrhythmias Atrial Fibrillation + Valvular Problems/Murmurs MR  Rhythm:Regular Rate:Normal  ECG: SB, incomplete RBBB, rate 58  ECHO: Left ventricle: The cavity size was normal. Systolic function was normal. The estimated ejection fraction was in the range of 55% to 60%. Wall motion was normal; there were no regional wall motion abnormalities. There was a reduced contribution of atrial contraction to ventricular filling, due to increased ventricular diastolic pressure or atrial contractile dysfunction. Doppler parameters are consistent with a reversible restrictive pattern, indicative of decreased left ventricular diastolic compliance and/or increased left atrial pressure (grade 3 diastolic dysfunction). Doppler parameters are consistent with high ventricular filling pressure. 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 The findings are consistent with moderate stenosis. Mean gradient (D): 8 mm Hg. Peak gradient (D): 21 mm Hg. Valve area by   pressure half-time: 1.59 cm^2. Valve area by continuity equation (using LVOT flow): 1.2 cm^2. Left atrium: The atrium was mildly dilated. Anterior-posterior dimension: 41 mm. Atrial septum: There was increased thickness of the septum, consistent with lipomatous hypertrophy.  Sees  cardiologist Burt Knack)   Neuro/Psych PSYCHIATRIC DISORDERS Depression negative neurological ROS     GI/Hepatic Neg liver ROS, hiatal hernia, GERD  Medicated and Controlled,  Endo/Other  diabetes, Oral Hypoglycemic Agents  Renal/GU ESRFRenal disease     Musculoskeletal negative musculoskeletal ROS (+) Chronic back pain   Abdominal   Peds  Hematology  (+) anemia ,   Anesthesia Other Findings 5.4 cm AAA  Reproductive/Obstetrics                             Anesthesia Physical  Anesthesia Plan  ASA: IV  Anesthesia Plan: General   Post-op Pain Management:    Induction: Intravenous  PONV Risk Score and Plan: 2 and Ondansetron, Midazolam and Treatment may vary due to age or medical condition  Airway Management Planned: Oral ETT  Additional Equipment:   Intra-op Plan:   Post-operative Plan: Extubation in OR  Informed Consent: I have reviewed the patients History and Physical, chart, labs and discussed the procedure including the risks, benefits and alternatives for the proposed anesthesia with the patient or authorized representative who has indicated his/her understanding and acceptance.   Dental advisory given  Plan Discussed with: CRNA  Anesthesia Plan Comments:         Anesthesia Quick Evaluation

## 2018-04-19 NOTE — Progress Notes (Signed)
PROGRESS NOTE        PATIENT DETAILS Name: [name] Age: 67 y.o. Sex: female Date of Birth: 13-Nov-1950 Admit Date: 04/09/2018 Admitting Physician Vilma Prader, MD [id number], Shanon Brow, MD  Brief Narrative: Patient is a 67 y.o. female with history of CKD stage V, CAD status post CABG 2012, repair of AAA in May 2019, COPD on nocturnal home O2, hypertension, presented with weakness, she was found to have hemoglobin of 7.3 with concerns for GI bleeding, subsequent hospital course complicated by gallstone pancreatitis, volume overload/decompensated heart failure.  She was transfused 1 unit of PRBC underwent EGD that did not show any bleeding source.  Volume overload was managed with diuretics, pancreatitis was managed with supportive care.  She significantly improved, and underwent laparoscopic cholecystectomy on 8/14.  See below for further details.  Subjective: Lying flat-no chest pain shortness of breath.  No abdominal pain.  Assessment/Plan: Symptomatic anemia secondary to possible GI bleeding (maroon stools): Underwent 1 unit of PRBC hemoglobin currently stable.  EGD unrevealing for bleeding foci.  Hemoglobin currently stable.  No further recommendations from gastroenterology I patient has had unsuccessful colonoscopies in the past due to muscular hypertrophy of the bowel.  Follow hemoglobin periodically.  Gallstone pancreatitis: Significantly improved with supportive care, underwent laparoscopic cholecystectomy on 8/14.  Surgery following.  Diastolic heart failure/volume overload: Will be secondary to IV fluid resuscitation for pancreatitis/GI bleeding.  She is not euvolemic following diuretic treatment.  Follow volume status, electrolytes and weights closely.  CAD: Currently without any anginal symptoms, Plavix on hold due to concern for GI bleeding-however EGD did not show any bleeding foci.  Once okay with surgery-we will resume Plavix postoperatively.   Continue beta-blocker and statin.  Status post AAA repair in 2019: Stable-continue outpatient follow-up with vascular surgery  CKD stage V: Creatinine close to usual baseline-continue outpatient follow-up with nephrology (Dr. Posey Pronto)  Hypertension: Controlled with Coreg, amlodipine, Imdur, hydralazine and clonidine.  Gout: No flare-continue allopurinol  COPD: Stable-continue with bronchodilators.  GERD: Continue PPI  DVT Prophylaxis: SCD's  Code Status: Full code  Family Communication: Daughter at bedside  Disposition Plan: Remain inpatient-home in the next 1-2 days.  Antimicrobial agents: Anti-infectives (From admission, onward)   Start     Dose/Rate Route Frequency Ordered Stop   04/14/18 1100  cefTRIAXone (ROCEPHIN) 2 g in sodium chloride 0.9 % 100 mL IVPB    Note to Pharmacy:  Pharmacy may adjust dosing strength / duration / interval for maximal efficacy - dose checked and appropriate   2 g 200 mL/hr over 30 Minutes Intravenous Every 24 hours 04/14/18 1018        Procedures: 8/14>> laparoscopic cholecystectomy 8/7>> EGD  CONSULTS:  Vascular: Dr. Trula Slade 04/12/2018  Gastroenterology: Dr. [name] 04/11/2018  General surgery: Dr. Rosendo Gros 04/14/2018  Time spent: 25- minutes-Greater than 50% of this time was spent in counseling, explanation of diagnosis, planning of further management, and coordination of care.  MEDICATIONS: Scheduled Meds: . acetaminophen  650 mg Oral Q6H  . allopurinol  100 mg Oral Daily  . amLODipine  10 mg Oral Daily  . atorvastatin  40 mg Oral QPM  . budesonide (PULMICORT) nebulizer solution  0.25 mg Nebulization BID  . carvedilol  12.5 mg Oral BID WC  . cholecalciferol  5,000 Units Oral Daily  . cloNIDine  0.2 mg Oral Q8H  . DULoxetine  60 mg Oral Daily  . gabapentin  300 mg Oral BID  . hydrALAZINE  100 mg Oral Q8H  . ipratropium-albuterol  3 mL Nebulization TID  . isosorbide mononitrate  15 mg Oral Daily  . lidocaine  1 patch Transdermal  Q24H  . nicotine  21 mg Transdermal Daily  . pantoprazole  40 mg Oral Daily  . sodium chloride flush  3 mL Intravenous Q12H   Continuous Infusions: . sodium chloride 10 mL/hr at 04/19/18 1355  . cefTRIAXone (ROCEPHIN)  IV 2 g (04/18/18 1058)   PRN Meds:.docusate sodium, hydrALAZINE, HYDROcodone-acetaminophen, HYDROmorphone (DILAUDID) injection, hydroxypropyl methylcellulose / hypromellose, ipratropium-albuterol, sorbitol   PHYSICAL EXAM: Vital signs: Vitals:   04/19/18 1315 04/19/18 1330 04/19/18 1355 04/19/18 1436  BP: (!) 138/59 (!) 141/55 (!) 138/54   Pulse: 63 63 63   Resp: (!) 9 15 14    Temp:  97.9 F (36.6 C)    TempSrc:      SpO2: 92% 92% 90% (!) 89%  Weight:      Height:       Filed Weights   04/18/18 0500 04/19/18 0521 04/19/18 0940  Weight: 63.9 kg 62.9 kg 62.9 kg   Body mass index is 23.08 kg/m.   General appearance :Awake, alert, not in any distress. Speech Clear. Not toxic Looking Eyes:, pupils equally reactive to light and accomodation,no scleral icterus.Pink conjunctiva HEENT: Atraumatic and Normocephalic Neck: supple, no JVD. No cervical lymphadenopathy. No thyromegaly Resp:Good air entry bilaterally, no added sounds  CVS: S1 S2 regular, no murmurs.  GI: Bowel sounds present, Non tender and not distended with no gaurding, rigidity or rebound.No organomegaly Extremities: B/L Lower Ext shows no edema, both legs are warm to touch Neurology:  speech clear,Non focal, sensation is grossly intact. Psychiatric: Normal judgment and insight. Alert and oriented x 3. Normal mood. Musculoskeletal:No digital cyanosis Skin:No Rash, warm and dry Wounds:N/A  I have personally reviewed following labs and imaging studies  LABORATORY DATA: CBC: Recent Labs  Lab 04/15/18 0529 04/15/18 1412 04/16/18 0610 04/17/18 0534 04/18/18 0501 04/19/18 0507  WBC 7.2  --  8.9 8.6 8.2 8.0  NEUTROABS 5.6  --  7.4 7.1 6.6 6.7  HGB 7.3* 8.3* 8.5* 8.7* 8.1* 8.4*  HCT 24.0* 27.6*  29.1* 28.3* 26.8* 27.5*  MCV 96.8  --  99.7 96.3 96.1 95.2  PLT 197  --  233 250 236 500    Basic Metabolic Panel: Recent Labs  Lab 04/15/18 0529 04/16/18 0610 04/17/18 0534 04/18/18 0501 04/19/18 0507  NA 142 141 141 140 139  K 3.7 4.8 4.3 3.9 3.9  CL 114* 107 110 105 102  CO2 20* 17* 18* 25 24  GLUCOSE 78 69* 121* 126* 110*  BUN 24* 29* 25* 20 21  CREATININE 2.01* 2.54* 2.18* 2.12* 2.13*  CALCIUM 6.8* 8.6* 8.9 9.0 9.1    GFR: Estimated Creatinine Clearance: 23.7 mL/min (A) (by C-G formula based on SCr of 2.13 mg/dL (H)).  Liver Function Tests: Recent Labs  Lab 04/14/18 0419 04/15/18 0529 04/16/18 0610 04/17/18 0534 04/19/18 0507  AST 14* 10* 12* 15 12*  ALT 7 7 7 7 8   ALKPHOS 34* 34* 41 43 47  BILITOT 1.3* 0.8 1.1 0.9 1.2  PROT 5.0* 3.9* 5.0* 5.3* 5.1*  ALBUMIN 2.3* 1.8* 2.3* 2.4* 2.3*   Recent Labs  Lab 04/15/18 0529 04/16/18 0610 04/17/18 0534 04/18/18 0501 04/19/18 0507  LIPASE 169* 281* 203* 256* 90*   No results for input(s): AMMONIA in the last  168 hours.  Coagulation Profile: No results for input(s): INR, PROTIME in the last 168 hours.  Cardiac Enzymes: No results for input(s): CKTOTAL, CKMB, CKMBINDEX, TROPONINI in the last 168 hours.  BNP (last 3 results) No results for input(s): PROBNP in the last 8760 hours.  HbA1C: No results for input(s): HGBA1C in the last 72 hours.  CBG: Recent Labs  Lab 04/19/18 1231  GLUCAP 108*    Lipid Profile: No results for input(s): CHOL, HDL, LDLCALC, TRIG, CHOLHDL, LDLDIRECT in the last 72 hours.  Thyroid Function Tests: No results for input(s): TSH, T4TOTAL, FREET4, T3FREE, THYROIDAB in the last 72 hours.  Anemia Panel: No results for input(s): VITAMINB12, FOLATE, FERRITIN, TIBC, IRON, RETICCTPCT in the last 72 hours.  Urine analysis:    Component Value Date/Time   COLORURINE YELLOW 01/16/2018 2355   APPEARANCEUR CLEAR 01/16/2018 2355   LABSPEC 1.009 01/16/2018 2355   PHURINE 6.0  01/16/2018 2355   GLUCOSEU NEGATIVE 01/16/2018 2355   HGBUR NEGATIVE 01/16/2018 2355   BILIRUBINUR NEGATIVE 01/16/2018 2355   KETONESUR NEGATIVE 01/16/2018 2355   PROTEINUR NEGATIVE 01/16/2018 2355   UROBILINOGEN 0.2 03/04/2011 2107   NITRITE NEGATIVE 01/16/2018 2355   LEUKOCYTESUR NEGATIVE 01/16/2018 2355    Sepsis Labs: Lactic Acid, Venous    Component Value Date/Time   LATICACIDVEN 0.9 01/20/2018 1023    MICROBIOLOGY: Recent Results (from the past 240 hour(s))  Surgical pcr screen     Status: None   Collection Time: 04/19/18  9:16 AM  Result Value Ref Range Status   MRSA, PCR NEGATIVE NEGATIVE Final   Staphylococcus aureus NEGATIVE NEGATIVE Final    Comment: (NOTE) The Xpert SA Assay (FDA approved for NASAL specimens in patients 53 years of age and older), is one component of a comprehensive surveillance program. It is not intended to diagnose infection nor to guide or monitor treatment. Performed at [facility], [place] [address], [place], [place] [address]     RADIOLOGY STUDIES/RESULTS: Ct Abdomen Pelvis Wo Contrast  Result Date: 04/18/2018 CLINICAL DATA:  Acute pancreatitis. Anemia. In stage renal disease. Cardiac disease. Aortic aneurysm repair in May 2018. COPD. Nighttime oxygen use. EXAM: CT ABDOMEN AND PELVIS WITHOUT CONTRAST TECHNIQUE: Multidetector CT imaging of the abdomen and pelvis was performed following the standard protocol without IV contrast. COMPARISON:  04/12/2017 FINDINGS: Lower chest: Small right and trace left pleural effusions with associated passive atelectasis. Mild airway thickening in the lower lobes. Low-density blood pool suggesting anemia.  Mild cardiomegaly. Coronary atherosclerosis. Mitral valve prosthesis. Descending aortic atherosclerotic vascular disease. Hepatobiliary: Dependent density in the gallbladder probably from sludge, less likely gallstones. Pancreas: As noted on the prior exam there is considerable indistinctness of the  pancreas as well as the pancreaticoduodenal groove potentially from focal pancreatitis or mass. A vaguely seen hypodense lesion in the area of the pancreatic head measures 1.9 by 1.2 cm on image 34/3, previously the same by my measurements. There is no gas in the pancreas or within this lesion. There is minimal stranding around the rest of the pancreas. Spleen: Unremarkable Adrenals/Urinary Tract: Adrenal glands unremarkable. Vascular calcifications in both renal hila. 1.1 cm exophytic lesion of the left kidney lower pole with internal density of 0 Hounsfield units, probably a cyst but technically nonspecific. Cannot exclude nonobstructive renal calculi given the peripheral calcifications along the renal hila. Stents in both proximal renal arteries. No hydronephrosis. Urinary bladder unremarkable. In the left lower perirenal space, there is an evolving hematoma which currently measures 5.5 by 4.0  cm, formerly 5.4 by 4.0 cm. A smaller loculation tracks down along the left adnexa. This was previously larger and of higher density for example on 01/19/2018. Stomach/Bowel: Wall thickening in the duodenal bulb and descending duodenum in the vicinity of the presumed pancreatitis. Postoperative findings in the rectum. Vascular/Lymphatic: Aortoiliac atherosclerotic vascular disease. Abdominal aortic aneurysm traversed by aorta bi-iliac stent graft. Today's noncontrast exam does not assess for patency or endoleak. The original aneurysm measures up to 5.1 cm anterior-posterior. No pathologic adenopathy is appreciated. Reproductive: Uterus absent. Lobularity along the left adnexa likely related to the original perirenal space hematoma. Other: Diffuse subcutaneous edema along the abdomen/pelvis. Musculoskeletal: No acute bony findings. IMPRESSION: 1. Very similar appearance of indistinct enlargement of the pancreatic head with obscuration of surrounding fat planes along the duodenum, as well as duodenal wall thickening. This is  probably from pancreatitis, neoplasm is considered less likely. Stable 1.9 by 1.2 cm hypodense lesion within the pancreatic head, I favor pseudocyst although abscess is not entirely excluded. Today's exam was performed without IV contrast, which lower sensitivity for complications such as pancreatic necrosis or abscess. No extraluminal gas or gas within the pancreas seen. 2. Small right and trace left pleural effusions. 3. Airway thickening is present, suggesting bronchitis or reactive airways disease. 4. Low-density blood pool suggests anemia. 5. Mild cardiomegaly with coronary atherosclerosis. 6.  Aortic Atherosclerosis (ICD10-I70.0). 7. Gallstones versus sludge in the gallbladder. 8. Stable appearance of abdominal aortic aneurysm traversed by aorta bi-iliac stent graft. 9. Prior left perirenal space hematoma extending down towards the left adnexa, with evolving blood products unchanged from 04/12/2018 but significantly reduced in size compared to examinations from may 2019. 10. Diffuse subcutaneous edema indicating third spacing of fluid. Electronically Signed   By: Van Clines M.D.   On: 04/18/2018 14:09   Ct Abdomen Pelvis Wo Contrast  Result Date: 04/12/2018 CLINICAL DATA:  67 year old with chronic renal insufficiency. Prior endovascular repair of abdominal aortic aneurysm in May, 2019. Patient currently with unexplained anemia and had a negative UPPER endoscopy earlier today. Evaluate for occult hemorrhage or hematoma, as the patient had a retroperitoneal hematoma after her endovascular abdominal aortic aneurysm repair. EXAM: CT ABDOMEN AND PELVIS WITHOUT CONTRAST TECHNIQUE: Multidetector CT imaging of the abdomen and pelvis was performed following the standard protocol without IV contrast. COMPARISON:  03/06/2018, 01/19/2018, 01/14/2018 and earlier. FINDINGS: Respiratory motion blurred images of the lung bases and the upper abdomen, making the examination less than optimal. Lower chest: Mild  atelectasis involving the RIGHT LOWER LOBE. Visualized lung bases otherwise clear. Heart moderately enlarged with severe RIGHT coronary artery atherosclerosis. Hepatobiliary: Normal unenhanced appearance of the liver. High attenuation bile/sludge and small gallstones within the gallbladder. No convincing pericholecystic inflammation. No biliary ductal dilation. Pancreas: Edema/inflammation surrounding the head of the pancreas with a 1.9 cm cyst in the head of the pancreas, not present on prior CTs. Mild edema/inflammation adjacent to the body of the pancreas. Spleen: Normal unenhanced appearance. Adrenals/Urinary Tract: Normal appearing RIGHT adrenal gland. Stable 1.2 cm low-attenuation LEFT adrenal nodule dating back to 2012. Small non-obstructing calculi involving UPPER pole calices of both kidneys. No urinary tract calculi elsewhere. No hydronephrosis involving either kidney. Within the limits of the unenhanced technique, no focal parenchymal abnormality involving either kidney. Stomach/Bowel: Stomach normal in appearance for the degree of distention. Wall thickening involving the descending duodenum and the proximal transverse duodenum. Remainder of the small bowel normal in appearance. Descending colon, sigmoid colon and rectum decompressed. Surgical anastomotic suture material at  the rectum, widely patent. Moderate stool burden involving the ascending colon. Surgically absent appendix. Vascular/Lymphatic: Prior endovascular abdominal aortic aneurysm repair with an aorto-bi-iliac stent graft. Native thrombosed aneurysm measures approximately 5.0 cm, unchanged since the CT 1 month ago and slightly decreased in size since May, 2019. 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 No pathologic lymphadenopathy. Reproductive: Surgically absent uterus.  No adnexal masses. Other: Approximate 5.4 x 4.0 x 7.1 cm fluid collection involving the LEFT  retroperitoneum in the UPPER pelvis, further decreased in size since the CT 1 month ago, indicating a resolving retroperitoneal hematoma. No new hemorrhage or hematoma in the abdomen or pelvis. Musculoskeletal: Calcification in the POSTERIOR annular fibers of protruded discs at L3-4 and L4-5 in this patient with 6 non-rib-bearing lumbar vertebrae. IMPRESSION: 1. Inflammatory changes involving the descending and proximal transverse duodenum and the pancreatic head and body. Pancreatitis with secondary inflammation of the duodenum is favored over duodenitis/duodenal ulcer with secondary pancreatitis. 2. Cholelithiasis and gallbladder sludge. No convincing evidence of acute cholecystitis. 3. No evidence of acute hemorrhage or hematoma in the abdomen or pelvis. Resolving LEFT retroperitoneal hematoma which has further decreased in size since 03/06/2018. 4. Endovascular abdominal aortic aneurysm repair. While endoleak cannot be entirely excluded without contrast administration, the fact that the thrombosed native aorta is stable in size suggests the absence of an endoleak. Electronically Signed   By: Evangeline Dakin M.D.   On: 04/12/2018 20:07   Ct Head Wo Contrast  Result Date: 04/09/2018 CLINICAL DATA:  Head injury July 17th, subsequent weakness. History of AV fistula, aortic stent graft, atrial fibrillation, diabetes. EXAM: CT HEAD WITHOUT CONTRAST CT CERVICAL SPINE WITHOUT CONTRAST TECHNIQUE: Multidetector CT imaging of the head and cervical spine was performed following the standard protocol without intravenous contrast. Multiplanar CT image reconstructions of the cervical spine were also generated. COMPARISON:  MRI of the head August 31, 2006 FINDINGS: CT HEAD FINDINGS BRAIN: No intraparenchymal hemorrhage, mass effect nor midline shift. Borderline parenchymal brain volume loss. Patchy supratentorial white matter hypodensities within normal range for patient's age, though non-specific are most compatible with  chronic small vessel ischemic disease. No acute large vascular territory infarcts. No abnormal extra-axial fluid collections. Basal cisterns are patent. VASCULAR: Moderate to severe calcific atherosclerosis of the carotid siphons and included vertebral arteries. SKULL: No skull fracture. No significant scalp soft tissue swelling. SINUSES/ORBITS: Trace paranasal sinus mucosal thickening. Mastoid air cells are well aerated.The included ocular globes and orbital contents are non-suspicious. OTHER: None. CT CERVICAL SPINE FINDINGS ALIGNMENT: Maintained lordosis. Vertebral bodies in alignment. SKULL BASE AND VERTEBRAE: Cervical vertebral bodies and posterior elements are intact. Moderate C4-5 and C5-6 disc height loss and endplate spurring compatible with degenerative discs. No destructive bony lesions. C1-2 articulation maintained. SOFT TISSUES AND SPINAL CANAL: Nonacute. Partially imaged subclavian artery stent severe calcific atherosclerosis RIGHT carotid bifurcation. Ectatic LEFT carotid bifurcation consistent with endarterectomy. DISC LEVELS: No significant osseous canal stenosis. Moderate RIGHT C4-5, mild C5-6 neural foraminal narrowing. UPPER CHEST: Lung apices are clear. OTHER: None. IMPRESSION: CT HEAD: 1. No acute intracranial process. 2. Mild parenchymal brain volume loss. 3. Moderate to severe atherosclerosis. CT CERVICAL SPINE: 1. No acute fracture or malalignment. 2. Moderate RIGHT C4-5 neural foraminal narrowing. 3. Severe calcific atherosclerosis RIGHT carotid bifurcation can result in hemodynamically significant stenosis. Status post LEFT carotid endarterectomy. Electronically Signed   By: Elon Alas M.D.   On: 04/09/2018 19:20   Ct Cervical Spine Wo Contrast  Result Date: 04/09/2018 CLINICAL DATA:  Head injury July 17th, subsequent weakness. History of AV fistula, aortic stent graft, atrial fibrillation, diabetes. EXAM: CT HEAD WITHOUT CONTRAST CT CERVICAL SPINE WITHOUT CONTRAST TECHNIQUE:  Multidetector CT imaging of the head and cervical spine was performed following the standard protocol without intravenous contrast. Multiplanar CT image reconstructions of the cervical spine were also generated. COMPARISON:  MRI of the head August 31, 2006 FINDINGS: CT HEAD FINDINGS BRAIN: No intraparenchymal hemorrhage, mass effect nor midline shift. Borderline parenchymal brain volume loss. Patchy supratentorial white matter hypodensities within normal range for patient's age, though non-specific are most compatible with chronic small vessel ischemic disease. No acute large vascular territory infarcts. No abnormal extra-axial fluid collections. Basal cisterns are patent. VASCULAR: Moderate to severe calcific atherosclerosis of the carotid siphons and included vertebral arteries. SKULL: No skull fracture. No significant scalp soft tissue swelling. SINUSES/ORBITS: Trace paranasal sinus mucosal thickening. Mastoid air cells are well aerated.The included ocular globes and orbital contents are non-suspicious. OTHER: None. CT CERVICAL SPINE FINDINGS ALIGNMENT: Maintained lordosis. Vertebral bodies in alignment. SKULL BASE AND VERTEBRAE: Cervical vertebral bodies and posterior elements are intact. Moderate C4-5 and C5-6 disc height loss and endplate spurring compatible with degenerative discs. No destructive bony lesions. C1-2 articulation maintained. SOFT TISSUES AND SPINAL CANAL: Nonacute. Partially imaged subclavian artery stent severe calcific atherosclerosis RIGHT carotid bifurcation. Ectatic LEFT carotid bifurcation consistent with endarterectomy. DISC LEVELS: No significant osseous canal stenosis. Moderate RIGHT C4-5, mild C5-6 neural foraminal narrowing. UPPER CHEST: Lung apices are clear. OTHER: None. IMPRESSION: CT HEAD: 1. No acute intracranial process. 2. Mild parenchymal brain volume loss. 3. Moderate to severe atherosclerosis. CT CERVICAL SPINE: 1. No acute fracture or malalignment. 2. Moderate RIGHT C4-5  neural foraminal narrowing. 3. Severe calcific atherosclerosis RIGHT carotid bifurcation can result in hemodynamically significant stenosis. Status post LEFT carotid endarterectomy. Electronically Signed   By: Elon Alas M.D.   On: 04/09/2018 19:20   Dg Chest Port 1 View  Result Date: 04/18/2018 CLINICAL DATA:  Pulmonary edema. EXAM: PORTABLE CHEST 1 VIEW COMPARISON:  04/17/2018. FINDINGS: Prior CABG and cardiac valve replacement. Left atrial appendage clip noted. Cardiomegaly with diffuse bilateral pulmonary interstitial prominence. Pulmonary interstitial prominence is improved from prior exam. Small bilateral pleural effusions again noted. No pneumothorax. IMPRESSION: Prior CABG and cardiac valve replacement. Stable cardiomegaly. Interim improvement of interstitial prominence. Tiny bilateral pleural effusions. Findings suggest improving CHF. Electronically Signed   By: Marcello Moores  Register   On: 04/18/2018 06:59   Dg Chest Port 1 View  Result Date: 04/17/2018 CLINICAL DATA:  Preoperative respiratory examination. EXAM: PORTABLE CHEST 1 VIEW COMPARISON:  01/22/2018 chest radiograph FINDINGS: Cardiomegaly, CABG/mitral valve replacement and LEFT atrial appendage clip again noted. Mild interstitial pulmonary edema and trace bilateral pleural effusions are noted. There is no evidence of pneumothorax. No acute bony abnormalities are present. IMPRESSION: Cardiomegaly with mild interstitial pulmonary edema and trace bilateral pleural effusions. Electronically Signed   By: Margarette Canada M.D.   On: 04/17/2018 09:24   US Abdomen Limited Ruq  Result Date: 04/13/2018 CLINICAL DATA:  Pancreatitis for 1 day. History of appendectomy and AAA repair. Chronic kidney disease stage 5. COPD. Hypertension. EXAM: ULTRASOUND ABDOMEN LIMITED RIGHT UPPER QUADRANT COMPARISON:  CT of the abdomen and pelvis on 04/12/2018 FINDINGS: Gallbladder: Gallbladder wall is thickened, 4.8 millimeters. There is fluid within the gallbladder wall  and a small amount of pericholecystic fluid. Gallstones are present, measuring approximately 5 millimeters in diameter. Gallbladder sludge is also noted. Patient is  tender throughout the RIGHT UPPER QUADRANT, nonspecific for acute cholecystitis. Common bile duct: Diameter: 4.0 millimeter Liver: No focal lesion identified. Within normal limits in parenchymal echogenicity. Portal vein is patent on color Doppler imaging with normal direction of blood flow towards the liver. IMPRESSION: 1. Gallstones and gallbladder sludge. 2. Fluid within a thickened gallbladder wall, suspicious for gangrenous cholecystitis. Nonfocal tenderness throughout the RIGHT UPPER QUADRANT during exam. 3. Small amount of perihepatic fluid. These results were called by telephone at the time of interpretation on 04/13/2018 at 9:08 pm to [facility] with [facility] who verbally acknowledged these results. Electronically Signed   By: Nolon Nations M.D.   On: 04/13/2018 21:09     LOS: 7 days   Oren Binet, MD  [facility]  If 7PM-7AM, please contact night-coverage  Please page via [email or web address] on MD name and type text message  04/19/2018, 3:58 PM

## 2018-04-19 NOTE — Progress Notes (Signed)
PT Cancellation Note  Patient Details Name: [name] MRN: [id number] DOB: October 21, 1950   Cancelled Treatment:    Reason Eval/Treat Not Completed: (P) Patient at procedure or test/unavailable(Pt for lap/chole today.  Will defer PT at this time and follow up per POC as patient is medically ready.  )   Cristela Blue 04/19/2018, 10:09 AM  Governor Rooks, PTA pager [phone]

## 2018-04-19 NOTE — Anesthesia Postprocedure Evaluation (Signed)
Anesthesia Post Note  Patient: [name]  Procedure(s) Performed: LAPAROSCOPIC CHOLECYSTECTOMY WITH INTRAOPERATIVE CHOLANGIOGRAM (N/A Abdomen)     Patient location during evaluation: PACU Anesthesia Type: General Level of consciousness: awake and alert Pain management: pain level controlled Vital Signs Assessment: post-procedure vital signs reviewed and stable Respiratory status: spontaneous breathing, nonlabored ventilation and respiratory function stable Cardiovascular status: blood pressure returned to baseline and stable Postop Assessment: no apparent nausea or vomiting Anesthetic complications: no    Last Vitals:  Vitals:   04/19/18 1315 04/19/18 1330  BP: (!) 138/59 (!) 141/55  Pulse: 63 63  Resp: (!) 9 15  Temp:  36.6 C  SpO2: 92% 92%    Last Pain:  Vitals:   04/19/18 1330  TempSrc:   PainSc: 0-No pain                 Lynda Rainwater

## 2018-04-19 NOTE — Discharge Instructions (Signed)

## 2018-04-19 NOTE — Progress Notes (Signed)
7 Days Post-Op   Subjective/Chief Complaint: Minimal ruq pain, ct with nothing more concerning than before, lipase down today   Objective: Vital signs in last 24 hours: Temp:  [97.8 F (36.6 C)-98.6 F (37 C)] 98.6 F (37 C) (08/14 0521) Pulse Rate:  [58-70] 70 (08/14 0521) Resp:  [16] 16 (08/14 0521) BP: (150-169)/(47-57) 169/54 (08/14 0521) SpO2:  [81 %-94 %] 90 % (08/14 0521) Weight:  [62.9 kg] 62.9 kg (08/14 0521) Last BM Date: 04/14/18  Intake/Output from previous day: 08/13 0701 - 08/14 0700 In: 0  Out: 800 [Urine:800] Intake/Output this shift: No intake/output data recorded.  GI: soft nontender nondistended  Lab Results:  Recent Labs    04/18/18 0501 04/19/18 0507  WBC 8.2 8.0  HGB 8.1* 8.4*  HCT 26.8* 27.5*  PLT 236 228   BMET Recent Labs    04/18/18 0501 04/19/18 0507  NA 140 139  K 3.9 3.9  CL 105 102  CO2 25 24  GLUCOSE 126* 110*  BUN 20 21  CREATININE 2.12* 2.13*  CALCIUM 9.0 9.1   PT/INR No results for input(s): LABPROT, INR in the last 72 hours. ABG No results for input(s): PHART, HCO3 in the last 72 hours.  Invalid input(s): PCO2, PO2  Studies/Results: Ct Abdomen Pelvis Wo Contrast  Result Date: 04/18/2018 CLINICAL DATA:  Acute pancreatitis. Anemia. In stage renal disease. Cardiac disease. Aortic aneurysm repair in May 2018. COPD. Nighttime oxygen use. EXAM: CT ABDOMEN AND PELVIS WITHOUT CONTRAST TECHNIQUE: Multidetector CT imaging of the abdomen and pelvis was performed following the standard protocol without IV contrast. COMPARISON:  04/12/2017 FINDINGS: Lower chest: Small right and trace left pleural effusions with associated passive atelectasis. Mild airway thickening in the lower lobes. Low-density blood pool suggesting anemia.  Mild cardiomegaly. Coronary atherosclerosis. Mitral valve prosthesis. Descending aortic atherosclerotic vascular disease. Hepatobiliary: Dependent density in the gallbladder probably from sludge, less likely  gallstones. Pancreas: As noted on the prior exam there is considerable indistinctness of the pancreas as well as the pancreaticoduodenal groove potentially from focal pancreatitis or mass. A vaguely seen hypodense lesion in the area of the pancreatic head measures 1.9 by 1.2 cm on image 34/3, previously the same by my measurements. There is no gas in the pancreas or within this lesion. There is minimal stranding around the rest of the pancreas. Spleen: Unremarkable Adrenals/Urinary Tract: Adrenal glands unremarkable. Vascular calcifications in both renal hila. 1.1 cm exophytic lesion of the left kidney lower pole with internal density of 0 Hounsfield units, probably a cyst but technically nonspecific. Cannot exclude nonobstructive renal calculi given the peripheral calcifications along the renal hila. Stents in both proximal renal arteries. No hydronephrosis. Urinary bladder unremarkable. In the left lower perirenal space, there is an evolving hematoma which currently measures 5.5 by 4.0 cm, formerly 5.4 by 4.0 cm. A smaller loculation tracks down along the left adnexa. This was previously larger and of higher density for example on 01/19/2018. Stomach/Bowel: Wall thickening in the duodenal bulb and descending duodenum in the vicinity of the presumed pancreatitis. Postoperative findings in the rectum. Vascular/Lymphatic: Aortoiliac atherosclerotic vascular disease. Abdominal aortic aneurysm traversed by aorta bi-iliac stent graft. Today's noncontrast exam does not assess for patency or endoleak. The original aneurysm measures up to 5.1 cm anterior-posterior. No pathologic adenopathy is appreciated. Reproductive: Uterus absent. Lobularity along the left adnexa likely related to the original perirenal space hematoma. Other: Diffuse subcutaneous edema along the abdomen/pelvis. Musculoskeletal: No acute bony findings. IMPRESSION: 1. Very similar appearance of indistinct  enlargement of the pancreatic head with  obscuration of surrounding fat planes along the duodenum, as well as duodenal wall thickening. This is probably from pancreatitis, neoplasm is considered less likely. Stable 1.9 by 1.2 cm hypodense lesion within the pancreatic head, I favor pseudocyst although abscess is not entirely excluded. Today's exam was performed without IV contrast, which lower sensitivity for complications such as pancreatic necrosis or abscess. No extraluminal gas or gas within the pancreas seen. 2. Small right and trace left pleural effusions. 3. Airway thickening is present, suggesting bronchitis or reactive airways disease. 4. Low-density blood pool suggests anemia. 5. Mild cardiomegaly with coronary atherosclerosis. 6.  Aortic Atherosclerosis (ICD10-I70.0). 7. Gallstones versus sludge in the gallbladder. 8. Stable appearance of abdominal aortic aneurysm traversed by aorta bi-iliac stent graft. 9. Prior left perirenal space hematoma extending down towards the left adnexa, with evolving blood products unchanged from 04/12/2018 but significantly reduced in size compared to examinations from may 2019. 10. Diffuse subcutaneous edema indicating third spacing of fluid. Electronically Signed   By: Van Clines M.D.   On: 04/18/2018 14:09   Dg Chest Port 1 View  Result Date: 04/18/2018 CLINICAL DATA:  Pulmonary edema. EXAM: PORTABLE CHEST 1 VIEW COMPARISON:  04/17/2018. FINDINGS: Prior CABG and cardiac valve replacement. Left atrial appendage clip noted. Cardiomegaly with diffuse bilateral pulmonary interstitial prominence. Pulmonary interstitial prominence is improved from prior exam. Small bilateral pleural effusions again noted. No pneumothorax. IMPRESSION: Prior CABG and cardiac valve replacement. Stable cardiomegaly. Interim improvement of interstitial prominence. Tiny bilateral pleural effusions. Findings suggest improving CHF. Electronically Signed   By: Marcello Moores  Register   On: 04/18/2018 06:59   Dg Chest Port 1 View  Result  Date: 04/17/2018 CLINICAL DATA:  Preoperative respiratory examination. EXAM: PORTABLE CHEST 1 VIEW COMPARISON:  01/22/2018 chest radiograph FINDINGS: Cardiomegaly, CABG/mitral valve replacement and LEFT atrial appendage clip again noted. Mild interstitial pulmonary edema and trace bilateral pleural effusions are noted. There is no evidence of pneumothorax. No acute bony abnormalities are present. IMPRESSION: Cardiomegaly with mild interstitial pulmonary edema and trace bilateral pleural effusions. Electronically Signed   By: Margarette Canada M.D.   On: 04/17/2018 09:24    Anti-infectives: Anti-infectives (From admission, onward)   Start     Dose/Rate Route Frequency Ordered Stop   04/14/18 1100  cefTRIAXone (ROCEPHIN) 2 g in sodium chloride 0.9 % 100 mL IVPB    Note to Pharmacy:  Pharmacy may adjust dosing strength / duration / interval for maximal efficacy - dose checked and appropriate   2 g 200 mL/hr over 30 Minutes Intravenous Every 24 hours 04/14/18 1018        Assessment/Plan: Acute gallstone pancreatitis.Clinically resolving. Plan for lap chole today I discussed the procedure in detail.  We discussed the risks and benefits of a laparoscopic cholecystectomy and possible cholangiogram including, but not limited to bleeding, infection, injury to surrounding structures such as the intestine or liver, bile leak, retained gallstones, need to convert to an open procedure, prolonged diarrhea, blood clots such as  DVT, common bile duct injury, anesthesia risks, and possible need for additional procedures.  The likelihood of improvement in symptoms and return to the patient's normal status is good. We discussed the typical post-operative recovery course. GI bleed-still will need evaluation after surgery CAD status post CABG 2012 AAA repair 2019-endovascular stent COPD- 2L O2 at night Chronic back pain CKD- stable CHF- FEN -NPO VTE -SCDs/None otherwise, plavix on hold, she can have sq heparin  from my standpoint and would  recommend ID -Rocephin  Rolm Bookbinder 04/19/2018

## 2018-04-20 ENCOUNTER — Encounter (HOSPITAL_COMMUNITY): Payer: Self-pay | Admitting: General Surgery

## 2018-04-20 LAB — COMPREHENSIVE METABOLIC PANEL
ALT: 9 U/L (ref 0–44)
AST: 28 U/L (ref 15–41)
Albumin: 2.2 g/dL — ABNORMAL LOW (ref 3.5–5.0)
Alkaline Phosphatase: 46 U/L (ref 38–126)
Anion gap: 15 (ref 5–15)
BUN: 24 mg/dL — ABNORMAL HIGH (ref 8–23)
CHLORIDE: 102 mmol/L (ref 98–111)
CO2: 23 mmol/L (ref 22–32)
CREATININE: 2.36 mg/dL — AB (ref 0.44–1.00)
Calcium: 9 mg/dL (ref 8.9–10.3)
GFR, EST AFRICAN AMERICAN: 24 mL/min — AB (ref >60)
GFR, EST NON AFRICAN AMERICAN: 20 mL/min — AB (ref >60)
Glucose, Bld: 91 mg/dL (ref 70–99)
POTASSIUM: 4.1 mmol/L (ref 3.5–5.1)
SODIUM: 140 mmol/L (ref 135–145)
Total Bilirubin: 1.2 mg/dL (ref 0.3–1.2)
Total Protein: 4.8 g/dL — ABNORMAL LOW (ref 6.5–8.1)

## 2018-04-20 LAB — CBC WITH DIFFERENTIAL/PLATELET
Abs Immature Granulocytes: 0.1 10*3/uL (ref 0.0–0.1)
BASOS ABS: 0.1 10*3/uL (ref 0.0–0.1)
BASOS PCT: 1 %
EOS ABS: 0.2 10*3/uL (ref 0.0–0.7)
EOS PCT: 2 %
HCT: 27.9 % — ABNORMAL LOW (ref 36.0–46.0)
Hemoglobin: 8.3 g/dL — ABNORMAL LOW (ref 12.0–15.0)
Immature Granulocytes: 1 %
Lymphocytes Relative: 5 %
Lymphs Abs: 0.4 10*3/uL — ABNORMAL LOW (ref 0.7–4.0)
MCH: 29 pg (ref 26.0–34.0)
MCHC: 29.7 g/dL — AB (ref 30.0–36.0)
MCV: 97.6 fL (ref 78.0–100.0)
MONO ABS: 0.5 10*3/uL (ref 0.1–1.0)
MONOS PCT: 6 %
Neutro Abs: 7 10*3/uL (ref 1.7–7.7)
Neutrophils Relative %: 85 %
PLATELETS: 220 10*3/uL (ref 150–400)
RBC: 2.86 MIL/uL — ABNORMAL LOW (ref 3.87–5.11)
RDW: 17.7 % — AB (ref 11.5–15.5)
WBC: 8.3 10*3/uL (ref 4.0–10.5)

## 2018-04-20 LAB — LIPASE, BLOOD: LIPASE: 88 U/L — AB (ref 11–51)

## 2018-04-20 MED ORDER — POLYETHYLENE GLYCOL 3350 17 G PO PACK
17.0000 g | PACK | Freq: Every day | ORAL | Status: DC
  Administered 2018-04-20 – 2018-04-21 (×2): 17 g via ORAL
  Filled 2018-04-20 (×2): qty 1

## 2018-04-20 MED ORDER — PROMETHAZINE HCL 12.5 MG PO TABS: 12.5000 mg | ORAL_TABLET | Freq: Four times a day (QID) | ORAL | 0 refills | Status: DC | PRN

## 2018-04-20 MED ORDER — HYDROCODONE-ACETAMINOPHEN 5-325 MG PO TABS: 1.0000 | ORAL_TABLET | Freq: Four times a day (QID) | ORAL | 0 refills | Status: DC | PRN

## 2018-04-20 MED ORDER — ACETAMINOPHEN 500 MG PO TABS
1000.0000 mg | ORAL_TABLET | Freq: Four times a day (QID) | ORAL | Status: DC
  Administered 2018-04-20 – 2018-04-21 (×3): 1000 mg via ORAL
  Filled 2018-04-20 (×4): qty 2

## 2018-04-20 MED ORDER — IPRATROPIUM-ALBUTEROL 0.5-2.5 (3) MG/3ML IN SOLN
3.0000 mL | Freq: Two times a day (BID) | RESPIRATORY_TRACT | Status: DC
  Administered 2018-04-20 – 2018-04-21 (×3): 3 mL via RESPIRATORY_TRACT
  Filled 2018-04-20 (×3): qty 3

## 2018-04-20 MED ORDER — CLOPIDOGREL BISULFATE 75 MG PO TABS
75.0000 mg | ORAL_TABLET | Freq: Every day | ORAL | Status: DC
  Administered 2018-04-21: 75 mg via ORAL
  Filled 2018-04-20: qty 1

## 2018-04-20 MED ORDER — PROMETHAZINE HCL 25 MG PO TABS
12.5000 mg | ORAL_TABLET | Freq: Four times a day (QID) | ORAL | Status: DC | PRN
  Administered 2018-04-20 – 2018-04-21 (×2): 12.5 mg via ORAL
  Filled 2018-04-20 (×2): qty 1

## 2018-04-20 NOTE — Progress Notes (Signed)
PT Cancellation Note  Patient Details Name: [name] MRN: [id number] DOB: 01-29-1951   Cancelled Treatment:    Reason Eval/Treat Not Completed: Patient declined, no reason specified. Pt just eating breakfast at 11:40 and reporting post op abdominal pain. Requesting physical therapy return later. Will check back as time allows.    Benjiman Core, PTA Pager [phone] Acute Rehab  Allena Katz 04/20/2018, 11:40 AM

## 2018-04-20 NOTE — Progress Notes (Signed)
Physical Therapy Treatment Patient Details Name: [name] MRN: [id number] DOB: 11-20-1950 Today's Date: 04/20/2018    History of Present Illness 67 year old woman with medical problems including chronic kidney disease stage 5, CAD status post CABG in 2012, repair of abdominal aortic aneurysm in May 2019, COPD, nocturnal use of oxygen, chronic lumbar back pain, and hypertension presenting with weakness.    PT Comments    Pt requiring increased assist with mobility since abdominal surgery. Pt with significant pain during session, may benefit from abdominal binder to assist with pain management. On arrival to room pt on 3L O2. O2 removed prior to ambulation and pt's SpO2 quickly dropped to 87% on RA with no activity. O2 replaced for remainder of session. Will continue to follow acutely to progress activity tolerance and safety with mobility.     Follow Up Recommendations  Home health PT     Equipment Recommendations  None recommended by PT    Recommendations for Other Services       Precautions / Restrictions Precautions Precautions: Fall Precaution Comments: abd incision Restrictions Weight Bearing Restrictions: No    Mobility  Bed Mobility Overal bed mobility: Needs Assistance Bed Mobility: Sit to Supine       Sit to supine: Min assist;HOB elevated   General bed mobility comments: min A for LE management secondary to pain. HOB elevated.  Transfers Overall transfer level: Needs assistance Equipment used: Rolling walker (2 wheeled) Transfers: Sit to/from Stand Sit to Stand: Min assist         General transfer comment: min A to rise from EOB and recliner chair.  Ambulation/Gait Ambulation/Gait assistance: Min assist Gait Distance (Feet): 200 Feet Assistive device: Rolling walker (2 wheeled) Gait Pattern/deviations: Step-through pattern;Trunk flexed Gait velocity: decreased   General Gait Details: Cues to look up as pt in cervical flexion. Utalized RW for  ambulation as pt requiring increased support since surgery.    Stairs             Wheelchair Mobility    Modified Rankin (Stroke Patients Only)       Balance Overall balance assessment: Needs assistance Sitting-balance support: Feet supported Sitting balance-Leahy Scale: Normal       Standing balance-Leahy Scale: Fair Standing balance comment: reliant on UE support                            Cognition Arousal/Alertness: Awake/alert Behavior During Therapy: WFL for tasks assessed/performed Overall Cognitive Status: Within Functional Limits for tasks assessed                                        Exercises      General Comments General comments (skin integrity, edema, etc.): Pt on 3L O2 on arrival to room. O2 removed prior to ambulation and pt desats to 87% on RA while seated in chair. O2 reapplied for activity.      Pertinent Vitals/Pain Pain Assessment: 0-10 Pain Score: 8  Pain Location: abdomen Pain Descriptors / Indicators: Discomfort Pain Intervention(s): Monitored during session;Limited activity within patient's tolerance;Repositioned    Home Living                      Prior Function            PT Goals (current goals can now be found in the care plan section)  Acute Rehab PT Goals PT Goal Formulation: With patient Time For Goal Achievement: 04/24/18 Potential to Achieve Goals: Good Progress towards PT goals: Progressing toward goals    Frequency    Min 3X/week      PT Plan Current plan remains appropriate    Co-evaluation              AM-PAC PT "6 Clicks" Daily Activity  Outcome Measure  Difficulty turning over in bed (including adjusting bedclothes, sheets and blankets)?: None Difficulty moving from lying on back to sitting on the side of the bed? : Unable Difficulty sitting down on and standing up from a chair with arms (e.g., wheelchair, bedside commode, etc,.)?: Unable Help needed  moving to and from a bed to chair (including a wheelchair)?: A Little Help needed walking in hospital room?: A Little Help needed climbing 3-5 steps with a railing? : A Little 6 Click Score: 15    End of Session Equipment Utilized During Treatment: Gait belt Activity Tolerance: Patient tolerated treatment well   Nurse Communication: Mobility status(desat, may benefit from abd binder.) PT Visit Diagnosis: Unsteadiness on feet (R26.81);Difficulty in walking, not elsewhere classified (R26.2);Muscle weakness (generalized) (M62.81)     Time: 1856-3149 PT Time Calculation (min) (ACUTE ONLY): 30 min  Charges:  $Gait Training: 8-22 mins $Therapeutic Activity: 8-22 mins                     Benjiman Core, [place] Pager [phone] Acute Rehab   Allena Katz 04/20/2018, 3:24 PM

## 2018-04-20 NOTE — Progress Notes (Signed)
1 Day Post-Op   Subjective/Chief Complaint: Sore after surgery, eating some   Objective: Vital signs in last 24 hours: Temp:  [97.2 F (36.2 C)-98.2 F (36.8 C)] 98 F (36.7 C) (08/15 0447) Pulse Rate:  [59-71] 71 (08/15 0447) Resp:  [9-24] 10 (08/14 1720) BP: (136-169)/(48-59) 168/53 (08/15 0447) SpO2:  [89 %-93 %] 91 % (08/15 0447) Weight:  [62.9 kg] 62.9 kg (08/14 0940) Last BM Date: 04/18/18(per patient verbalization)  Intake/Output from previous day: 08/14 0701 - 08/15 0700 In: 441.1 [P.O.:20; I.V.:221.1; IV Piggyback:200] Out: 20 [Blood:20] Intake/Output this shift: No intake/output data recorded.  GI: expected tenderness postop, incisions clean  Lab Results:  Recent Labs    04/19/18 0507 04/20/18 0450  WBC 8.0 8.3  HGB 8.4* 8.3*  HCT 27.5* 27.9*  PLT 228 220   BMET Recent Labs    04/19/18 0507 04/20/18 0450  NA 139 140  K 3.9 4.1  CL 102 102  CO2 24 23  GLUCOSE 110* 91  BUN 21 24*  CREATININE 2.13* 2.36*  CALCIUM 9.1 9.0   PT/INR No results for input(s): LABPROT, INR in the last 72 hours. ABG No results for input(s): PHART, HCO3 in the last 72 hours.  Invalid input(s): PCO2, PO2  Studies/Results: Ct Abdomen Pelvis Wo Contrast  Result Date: 04/18/2018 CLINICAL DATA:  Acute pancreatitis. Anemia. In stage renal disease. Cardiac disease. Aortic aneurysm repair in May 2018. COPD. Nighttime oxygen use. EXAM: CT ABDOMEN AND PELVIS WITHOUT CONTRAST TECHNIQUE: Multidetector CT imaging of the abdomen and pelvis was performed following the standard protocol without IV contrast. COMPARISON:  04/12/2017 FINDINGS: Lower chest: Small right and trace left pleural effusions with associated passive atelectasis. Mild airway thickening in the lower lobes. Low-density blood pool suggesting anemia.  Mild cardiomegaly. Coronary atherosclerosis. Mitral valve prosthesis. Descending aortic atherosclerotic vascular disease. Hepatobiliary: Dependent density in the gallbladder  probably from sludge, less likely gallstones. Pancreas: As noted on the prior exam there is considerable indistinctness of the pancreas as well as the pancreaticoduodenal groove potentially from focal pancreatitis or mass. A vaguely seen hypodense lesion in the area of the pancreatic head measures 1.9 by 1.2 cm on image 34/3, previously the same by my measurements. There is no gas in the pancreas or within this lesion. There is minimal stranding around the rest of the pancreas. Spleen: Unremarkable Adrenals/Urinary Tract: Adrenal glands unremarkable. Vascular calcifications in both renal hila. 1.1 cm exophytic lesion of the left kidney lower pole with internal density of 0 Hounsfield units, probably a cyst but technically nonspecific. Cannot exclude nonobstructive renal calculi given the peripheral calcifications along the renal hila. Stents in both proximal renal arteries. No hydronephrosis. Urinary bladder unremarkable. In the left lower perirenal space, there is an evolving hematoma which currently measures 5.5 by 4.0 cm, formerly 5.4 by 4.0 cm. A smaller loculation tracks down along the left adnexa. This was previously larger and of higher density for example on 01/19/2018. Stomach/Bowel: Wall thickening in the duodenal bulb and descending duodenum in the vicinity of the presumed pancreatitis. Postoperative findings in the rectum. Vascular/Lymphatic: Aortoiliac atherosclerotic vascular disease. Abdominal aortic aneurysm traversed by aorta bi-iliac stent graft. Today's noncontrast exam does not assess for patency or endoleak. The original aneurysm measures up to 5.1 cm anterior-posterior. No pathologic adenopathy is appreciated. Reproductive: Uterus absent. Lobularity along the left adnexa likely related to the original perirenal space hematoma. Other: Diffuse subcutaneous edema along the abdomen/pelvis. Musculoskeletal: No acute bony findings. IMPRESSION: 1. Very similar appearance of indistinct enlargement  of  the pancreatic head with obscuration of surrounding fat planes along the duodenum, as well as duodenal wall thickening. This is probably from pancreatitis, neoplasm is considered less likely. Stable 1.9 by 1.2 cm hypodense lesion within the pancreatic head, I favor pseudocyst although abscess is not entirely excluded. Today's exam was performed without IV contrast, which lower sensitivity for complications such as pancreatic necrosis or abscess. No extraluminal gas or gas within the pancreas seen. 2. Small right and trace left pleural effusions. 3. Airway thickening is present, suggesting bronchitis or reactive airways disease. 4. Low-density blood pool suggests anemia. 5. Mild cardiomegaly with coronary atherosclerosis. 6.  Aortic Atherosclerosis (ICD10-I70.0). 7. Gallstones versus sludge in the gallbladder. 8. Stable appearance of abdominal aortic aneurysm traversed by aorta bi-iliac stent graft. 9. Prior left perirenal space hematoma extending down towards the left adnexa, with evolving blood products unchanged from 04/12/2018 but significantly reduced in size compared to examinations from may 2019. 10. Diffuse subcutaneous edema indicating third spacing of fluid. Electronically Signed   By: Van Clines M.D.   On: 04/18/2018 14:09    Anti-infectives: Anti-infectives (From admission, onward)   Start     Dose/Rate Route Frequency Ordered Stop   04/14/18 1100  cefTRIAXone (ROCEPHIN) 2 g in sodium chloride 0.9 % 100 mL IVPB  Status:  Discontinued    Note to Pharmacy:  Pharmacy may adjust dosing strength / duration / interval for maximal efficacy - dose checked and appropriate   2 g 200 mL/hr over 30 Minutes Intravenous Every 24 hours 04/14/18 1018 04/20/18 0923      Assessment/Plan: POD 1 lap chole for gsp- [employer] all improving, can go home when pain controlled, I have put rx for vicodin and phenergan in computer already. Will setup appt and dc instructions are there. May restart  plavix today Needs gi evaluation as outpatient for blood per recumt Dc abx  Rolm Bookbinder 04/20/2018

## 2018-04-20 NOTE — Progress Notes (Signed)
PROGRESS NOTE        PATIENT DETAILS Name: [name] Age: 67 y.o. Sex: female Date of Birth: 06-29-1951 Admit Date: 04/09/2018 Admitting Physician Vilma Prader, MD EXN:[id number], Shanon Brow, MD  Brief Narrative: Patient is a 67 y.o. female with history of CKD stage V, CAD status post CABG 2012, repair of AAA in May 2019, COPD on nocturnal home O2, hypertension, presented with weakness, she was found to have hemoglobin of 7.3 with concerns for GI bleeding, subsequent hospital course complicated by gallstone pancreatitis, volume overload/decompensated heart failure.  She was transfused 1 unit of PRBC underwent EGD that did not show any bleeding source.  Volume overload was managed with diuretics, pancreatitis was managed with supportive care.  She significantly improved, and underwent laparoscopic cholecystectomy on 8/14.  See below for further details.  Subjective: Complains of pain at the operative site-claims it is not well controlled.  Assessment/Plan: Symptomatic anemia secondary to possible GI bleeding (maroon stools): Underwent PRBC transfusion on admission.  Hemoglobin stable.  EGD unrevealing for any foci of bleeding.  No further recommendations from gastroenterology.  Apparently patient has had unsuccessful colonoscopies in the past due to muscular hypertrophy of the bowel.  Follow-up with GI in the outpatient setting.   Gallstone pancreatitis: Resolved, underwent laparoscopic cholecystectomy on 8/14.  Continues to have pain at the operative site-she has chronic back pain and is on narcotics as an outpatient, once "stronger" narcotics on discharge.  Suspect anxiety playing a role.  Abdomen benign exam-labs are stable.  Ambulate-optimize narcotic regimen-probably home on 8/16. Surgery following.  Diastolic heart failure/volume overload: Likely secondary to IV fluid resuscitation for pancreatitis and GI bleeding.  She is now euvolemic.  Continue to follow volume  status, electrolytes and weights closely.   CAD: Currently without any anginal symptoms, Plavix was on hold due to concern for GI bleeding and then subsequently for laparoscopic cholecystectomy, since hemoglobin stable-resume Plavix today (okay with surgery as well).  Continue with beta-blocker and statin.    Status post AAA repair in 2019: Stable, continue with outpatient follow-up with vascular surgery.    CKD stage V: Creatinine close to usual baseline-continue outpatient follow-up with nephrology (Dr. Posey Pronto)  Hypertension: Controlled, continue Coreg, amlodipine, Imdur, hydralazine and clonidine.   Gout: No flare, continue with allopurinol  COPD: Stable-continue with bronchodilators  GERD: Continue PPI  DVT Prophylaxis: SCD's  Code Status: Full code  Family Communication: Daughter at bedside  Disposition Plan: Remain inpatient-home in the next 1-2 days.  Antimicrobial agents: Anti-infectives (From admission, onward)   Start     Dose/Rate Route Frequency Ordered Stop   04/14/18 1100  cefTRIAXone (ROCEPHIN) 2 g in sodium chloride 0.9 % 100 mL IVPB  Status:  Discontinued    Note to Pharmacy:  Pharmacy may adjust dosing strength / duration / interval for maximal efficacy - dose checked and appropriate   2 g 200 mL/hr over 30 Minutes Intravenous Every 24 hours 04/14/18 1018 04/20/18 0923      Procedures: 8/14>> laparoscopic cholecystectomy 8/7>> EGD  CONSULTS:  Vascular: Dr. Trula Slade 04/12/2018  Gastroenterology: Dr. [name] 04/11/2018  General surgery: Dr. Rosendo Gros 04/14/2018  Time spent: 25- minutes-Greater than 50% of this time was spent in counseling, explanation of diagnosis, planning of further management, and coordination of care.  MEDICATIONS: Scheduled Meds: . acetaminophen  1,000 mg Oral Q6H  . allopurinol  100 mg Oral Daily  . amLODipine  10 mg Oral Daily  . atorvastatin  40 mg Oral QPM  . budesonide (PULMICORT) nebulizer solution  0.25 mg Nebulization BID    . carvedilol  12.5 mg Oral BID WC  . cholecalciferol  5,000 Units Oral Daily  . cloNIDine  0.2 mg Oral Q8H  . [START ON 04/21/2018] clopidogrel  75 mg Oral Q breakfast  . DULoxetine  60 mg Oral Daily  . gabapentin  300 mg Oral BID  . hydrALAZINE  100 mg Oral Q8H  . ipratropium-albuterol  3 mL Nebulization BID  . isosorbide mononitrate  15 mg Oral Daily  . lidocaine  1 patch Transdermal Q24H  . nicotine  21 mg Transdermal Daily  . pantoprazole  40 mg Oral Daily  . polyethylene glycol  17 g Oral Daily  . sodium chloride flush  3 mL Intravenous Q12H   Continuous Infusions: . sodium chloride Stopped (04/19/18 1405)   PRN Meds:.docusate sodium, hydrALAZINE, HYDROcodone-acetaminophen, HYDROmorphone (DILAUDID) injection, hydroxypropyl methylcellulose / hypromellose, ipratropium-albuterol, promethazine, sorbitol   PHYSICAL EXAM: Vital signs: Vitals:   04/19/18 2030 04/19/18 2109 04/20/18 0447 04/20/18 1347  BP:  (!) 136/58 (!) 168/53 (!) 174/52  Pulse:  (!) 59 71 70  Resp:    18  Temp:  98.2 F (36.8 C) 98 F (36.7 C) 97.7 F (36.5 C)  TempSrc:  Oral Oral Oral  SpO2: 92% 93% 91% 98%  Weight:      Height:       Filed Weights   04/18/18 0500 04/19/18 0521 04/19/18 0940  Weight: 63.9 kg 62.9 kg 62.9 kg   Body mass index is 23.08 kg/m.   General appearance:Awake, alert, not in any distress.  Eyes:no scleral icterus. HEENT: Atraumatic and Normocephalic Neck: supple, no JVD. Resp:Good air entry bilaterally,no rales or rhonchi CVS: S1 S2 regular, no murmurs.  GI: Bowel sounds present, mildly tender at the operative site but benign abdominal exam. Extremities: B/L Lower Ext shows no edema, both legs are warm to touch Neurology:  Non focal Psychiatric: Normal judgment and insight. Normal mood. Musculoskeletal:No digital cyanosis Skin:No Rash, warm and dry Wounds:N/A  I have personally reviewed following labs and imaging studies  LABORATORY DATA: CBC: Recent Labs  Lab  04/16/18 0610 04/17/18 0534 04/18/18 0501 04/19/18 0507 04/20/18 0450  WBC 8.9 8.6 8.2 8.0 8.3  NEUTROABS 7.4 7.1 6.6 6.7 7.0  HGB 8.5* 8.7* 8.1* 8.4* 8.3*  HCT 29.1* 28.3* 26.8* 27.5* 27.9*  MCV 99.7 96.3 96.1 95.2 97.6  PLT 233 250 236 228 025    Basic Metabolic Panel: Recent Labs  Lab 04/16/18 0610 04/17/18 0534 04/18/18 0501 04/19/18 0507 04/20/18 0450  NA 141 141 140 139 140  K 4.8 4.3 3.9 3.9 4.1  CL 107 110 105 102 102  CO2 17* 18* 25 24 23   GLUCOSE 69* 121* 126* 110* 91  BUN 29* 25* 20 21 24*  CREATININE 2.54* 2.18* 2.12* 2.13* 2.36*  CALCIUM 8.6* 8.9 9.0 9.1 9.0    GFR: Estimated Creatinine Clearance: 21.4 mL/min (A) (by C-G formula based on SCr of 2.36 mg/dL (H)).  Liver Function Tests: Recent Labs  Lab 04/15/18 0529 04/16/18 0610 04/17/18 0534 04/19/18 0507 04/20/18 0450  AST 10* 12* 15 12* 28  ALT 7 7 7 8 9   ALKPHOS 34* 41 43 47 46  BILITOT 0.8 1.1 0.9 1.2 1.2  PROT 3.9* 5.0* 5.3* 5.1* 4.8*  ALBUMIN 1.8* 2.3* 2.4* 2.3* 2.2*   Recent Labs  Lab 04/16/18 0610 04/17/18 0534 04/18/18 0501 04/19/18 0507 04/20/18 0450  LIPASE 281* 203* 256* 90* 88*   No results for input(s): AMMONIA in the last 168 hours.  Coagulation Profile: No results for input(s): INR, PROTIME in the last 168 hours.  Cardiac Enzymes: No results for input(s): CKTOTAL, CKMB, CKMBINDEX, TROPONINI in the last 168 hours.  BNP (last 3 results) No results for input(s): PROBNP in the last 8760 hours.  HbA1C: No results for input(s): HGBA1C in the last 72 hours.  CBG: Recent Labs  Lab 04/19/18 1231  GLUCAP 108*    Lipid Profile: No results for input(s): CHOL, HDL, LDLCALC, TRIG, CHOLHDL, LDLDIRECT in the last 72 hours.  Thyroid Function Tests: No results for input(s): TSH, T4TOTAL, FREET4, T3FREE, THYROIDAB in the last 72 hours.  Anemia Panel: No results for input(s): VITAMINB12, FOLATE, FERRITIN, TIBC, IRON, RETICCTPCT in the last 72 hours.  Urine analysis:      Component Value Date/Time   COLORURINE YELLOW 01/16/2018 2355   APPEARANCEUR CLEAR 01/16/2018 2355   LABSPEC 1.009 01/16/2018 2355   PHURINE 6.0 01/16/2018 2355   GLUCOSEU NEGATIVE 01/16/2018 2355   HGBUR NEGATIVE 01/16/2018 2355   BILIRUBINUR NEGATIVE 01/16/2018 2355   KETONESUR NEGATIVE 01/16/2018 2355   PROTEINUR NEGATIVE 01/16/2018 2355   UROBILINOGEN 0.2 03/04/2011 2107   NITRITE NEGATIVE 01/16/2018 2355   LEUKOCYTESUR NEGATIVE 01/16/2018 2355    Sepsis Labs: Lactic Acid, Venous    Component Value Date/Time   LATICACIDVEN 0.9 01/20/2018 1023    MICROBIOLOGY: Recent Results (from the past 240 hour(s))  Surgical pcr screen     Status: None   Collection Time: 04/19/18  9:16 AM  Result Value Ref Range Status   MRSA, PCR NEGATIVE NEGATIVE Final   Staphylococcus aureus NEGATIVE NEGATIVE Final    Comment: (NOTE) The Xpert SA Assay (FDA approved for NASAL specimens in patients 12 years of age and older), is one component of a comprehensive surveillance program. It is not intended to diagnose infection nor to guide or monitor treatment. Performed at [facility], [place] [address], [place], [place] [address]     RADIOLOGY STUDIES/RESULTS: Ct Abdomen Pelvis Wo Contrast  Result Date: 04/18/2018 CLINICAL DATA:  Acute pancreatitis. Anemia. In stage renal disease. Cardiac disease. Aortic aneurysm repair in May 2018. COPD. Nighttime oxygen use. EXAM: CT ABDOMEN AND PELVIS WITHOUT CONTRAST TECHNIQUE: Multidetector CT imaging of the abdomen and pelvis was performed following the standard protocol without IV contrast. COMPARISON:  04/12/2017 FINDINGS: Lower chest: Small right and trace left pleural effusions with associated passive atelectasis. Mild airway thickening in the lower lobes. Low-density blood pool suggesting anemia.  Mild cardiomegaly. Coronary atherosclerosis. Mitral valve prosthesis. Descending aortic atherosclerotic vascular disease. Hepatobiliary: Dependent density  in the gallbladder probably from sludge, less likely gallstones. Pancreas: As noted on the prior exam there is considerable indistinctness of the pancreas as well as the pancreaticoduodenal groove potentially from focal pancreatitis or mass. A vaguely seen hypodense lesion in the area of the pancreatic head measures 1.9 by 1.2 cm on image 34/3, previously the same by my measurements. There is no gas in the pancreas or within this lesion. There is minimal stranding around the rest of the pancreas. Spleen: Unremarkable Adrenals/Urinary Tract: Adrenal glands unremarkable. Vascular calcifications in both renal hila. 1.1 cm exophytic lesion of the left kidney lower pole with internal density of 0 Hounsfield units, probably a cyst but technically nonspecific. Cannot exclude nonobstructive renal calculi given the peripheral calcifications along the renal  hila. Stents in both proximal renal arteries. No hydronephrosis. Urinary bladder unremarkable. In the left lower perirenal space, there is an evolving hematoma which currently measures 5.5 by 4.0 cm, formerly 5.4 by 4.0 cm. A smaller loculation tracks down along the left adnexa. This was previously larger and of higher density for example on 01/19/2018. Stomach/Bowel: Wall thickening in the duodenal bulb and descending duodenum in the vicinity of the presumed pancreatitis. Postoperative findings in the rectum. Vascular/Lymphatic: Aortoiliac atherosclerotic vascular disease. Abdominal aortic aneurysm traversed by aorta bi-iliac stent graft. Today's noncontrast exam does not assess for patency or endoleak. The original aneurysm measures up to 5.1 cm anterior-posterior. No pathologic adenopathy is appreciated. Reproductive: Uterus absent. Lobularity along the left adnexa likely related to the original perirenal space hematoma. Other: Diffuse subcutaneous edema along the abdomen/pelvis. Musculoskeletal: No acute bony findings. IMPRESSION: 1. Very similar appearance of  indistinct enlargement of the pancreatic head with obscuration of surrounding fat planes along the duodenum, as well as duodenal wall thickening. This is probably from pancreatitis, neoplasm is considered less likely. Stable 1.9 by 1.2 cm hypodense lesion within the pancreatic head, I favor pseudocyst although abscess is not entirely excluded. Today's exam was performed without IV contrast, which lower sensitivity for complications such as pancreatic necrosis or abscess. No extraluminal gas or gas within the pancreas seen. 2. Small right and trace left pleural effusions. 3. Airway thickening is present, suggesting bronchitis or reactive airways disease. 4. Low-density blood pool suggests anemia. 5. Mild cardiomegaly with coronary atherosclerosis. 6.  Aortic Atherosclerosis (ICD10-I70.0). 7. Gallstones versus sludge in the gallbladder. 8. Stable appearance of abdominal aortic aneurysm traversed by aorta bi-iliac stent graft. 9. Prior left perirenal space hematoma extending down towards the left adnexa, with evolving blood products unchanged from 04/12/2018 but significantly reduced in size compared to examinations from may 2019. 10. Diffuse subcutaneous edema indicating third spacing of fluid. Electronically Signed   By: Van Clines M.D.   On: 04/18/2018 14:09   Ct Abdomen Pelvis Wo Contrast  Result Date: 04/12/2018 CLINICAL DATA:  67 year old with chronic renal insufficiency. Prior endovascular repair of abdominal aortic aneurysm in May, 2019. Patient currently with unexplained anemia and had a negative UPPER endoscopy earlier today. Evaluate for occult hemorrhage or hematoma, as the patient had a retroperitoneal hematoma after her endovascular abdominal aortic aneurysm repair. EXAM: CT ABDOMEN AND PELVIS WITHOUT CONTRAST TECHNIQUE: Multidetector CT imaging of the abdomen and pelvis was performed following the standard protocol without IV contrast. COMPARISON:  03/06/2018, 01/19/2018, 01/14/2018 and  earlier. FINDINGS: Respiratory motion blurred images of the lung bases and the upper abdomen, making the examination less than optimal. Lower chest: Mild atelectasis involving the RIGHT LOWER LOBE. Visualized lung bases otherwise clear. Heart moderately enlarged with severe RIGHT coronary artery atherosclerosis. Hepatobiliary: Normal unenhanced appearance of the liver. High attenuation bile/sludge and small gallstones within the gallbladder. No convincing pericholecystic inflammation. No biliary ductal dilation. Pancreas: Edema/inflammation surrounding the head of the pancreas with a 1.9 cm cyst in the head of the pancreas, not present on prior CTs. Mild edema/inflammation adjacent to the body of the pancreas. Spleen: Normal unenhanced appearance. Adrenals/Urinary Tract: Normal appearing RIGHT adrenal gland. Stable 1.2 cm low-attenuation LEFT adrenal nodule dating back to 2012. Small non-obstructing calculi involving UPPER pole calices of both kidneys. No urinary tract calculi elsewhere. No hydronephrosis involving either kidney. Within the limits of the unenhanced technique, no focal parenchymal abnormality involving either kidney. Stomach/Bowel: Stomach normal in appearance for the degree of distention. Wall thickening  involving the descending duodenum and the proximal transverse duodenum. Remainder of the small bowel normal in appearance. Descending colon, sigmoid colon and rectum decompressed. Surgical anastomotic suture material at the rectum, widely patent. Moderate stool burden involving the ascending colon. Surgically absent appendix. Vascular/Lymphatic: Prior endovascular abdominal aortic aneurysm repair with an aorto-bi-iliac stent graft. Native thrombosed aneurysm measures approximately 5.0 cm, unchanged since the CT 1 month ago and slightly decreased in size since May, 2019. 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 No pathologic  lymphadenopathy. Reproductive: Surgically absent uterus.  No adnexal masses. Other: Approximate 5.4 x 4.0 x 7.1 cm fluid collection involving the LEFT retroperitoneum in the UPPER pelvis, further decreased in size since the CT 1 month ago, indicating a resolving retroperitoneal hematoma. No new hemorrhage or hematoma in the abdomen or pelvis. Musculoskeletal: Calcification in the POSTERIOR annular fibers of protruded discs at L3-4 and L4-5 in this patient with 6 non-rib-bearing lumbar vertebrae. IMPRESSION: 1. Inflammatory changes involving the descending and proximal transverse duodenum and the pancreatic head and body. Pancreatitis with secondary inflammation of the duodenum is favored over duodenitis/duodenal ulcer with secondary pancreatitis. 2. Cholelithiasis and gallbladder sludge. No convincing evidence of acute cholecystitis. 3. No evidence of acute hemorrhage or hematoma in the abdomen or pelvis. Resolving LEFT retroperitoneal hematoma which has further decreased in size since 03/06/2018. 4. Endovascular abdominal aortic aneurysm repair. While endoleak cannot be entirely excluded without contrast administration, the fact that the thrombosed native aorta is stable in size suggests the absence of an endoleak. Electronically Signed   By: Evangeline Dakin M.D.   On: 04/12/2018 20:07   Ct Head Wo Contrast  Result Date: 04/09/2018 CLINICAL DATA:  Head injury July 17th, subsequent weakness. History of AV fistula, aortic stent graft, atrial fibrillation, diabetes. EXAM: CT HEAD WITHOUT CONTRAST CT CERVICAL SPINE WITHOUT CONTRAST TECHNIQUE: Multidetector CT imaging of the head and cervical spine was performed following the standard protocol without intravenous contrast. Multiplanar CT image reconstructions of the cervical spine were also generated. COMPARISON:  MRI of the head August 31, 2006 FINDINGS: CT HEAD FINDINGS BRAIN: No intraparenchymal hemorrhage, mass effect nor midline shift. Borderline parenchymal  brain volume loss. Patchy supratentorial white matter hypodensities within normal range for patient's age, though non-specific are most compatible with chronic small vessel ischemic disease. No acute large vascular territory infarcts. No abnormal extra-axial fluid collections. Basal cisterns are patent. VASCULAR: Moderate to severe calcific atherosclerosis of the carotid siphons and included vertebral arteries. SKULL: No skull fracture. No significant scalp soft tissue swelling. SINUSES/ORBITS: Trace paranasal sinus mucosal thickening. Mastoid air cells are well aerated.The included ocular globes and orbital contents are non-suspicious. OTHER: None. CT CERVICAL SPINE FINDINGS ALIGNMENT: Maintained lordosis. Vertebral bodies in alignment. SKULL BASE AND VERTEBRAE: Cervical vertebral bodies and posterior elements are intact. Moderate C4-5 and C5-6 disc height loss and endplate spurring compatible with degenerative discs. No destructive bony lesions. C1-2 articulation maintained. SOFT TISSUES AND SPINAL CANAL: Nonacute. Partially imaged subclavian artery stent severe calcific atherosclerosis RIGHT carotid bifurcation. Ectatic LEFT carotid bifurcation consistent with endarterectomy. DISC LEVELS: No significant osseous canal stenosis. Moderate RIGHT C4-5, mild C5-6 neural foraminal narrowing. UPPER CHEST: Lung apices are clear. OTHER: None. IMPRESSION: CT HEAD: 1. No acute intracranial process. 2. Mild parenchymal brain volume loss. 3. Moderate to severe atherosclerosis. CT CERVICAL SPINE: 1. No acute fracture or malalignment. 2. Moderate RIGHT C4-5 neural foraminal narrowing. 3. Severe calcific atherosclerosis RIGHT carotid bifurcation can result in hemodynamically  significant stenosis. Status post LEFT carotid endarterectomy. Electronically Signed   By: Elon Alas M.D.   On: 04/09/2018 19:20   Ct Cervical Spine Wo Contrast  Result Date: 04/09/2018 CLINICAL DATA:  Head injury July 17th, subsequent weakness.  History of AV fistula, aortic stent graft, atrial fibrillation, diabetes. EXAM: CT HEAD WITHOUT CONTRAST CT CERVICAL SPINE WITHOUT CONTRAST TECHNIQUE: Multidetector CT imaging of the head and cervical spine was performed following the standard protocol without intravenous contrast. Multiplanar CT image reconstructions of the cervical spine were also generated. COMPARISON:  MRI of the head August 31, 2006 FINDINGS: CT HEAD FINDINGS BRAIN: No intraparenchymal hemorrhage, mass effect nor midline shift. Borderline parenchymal brain volume loss. Patchy supratentorial white matter hypodensities within normal range for patient's age, though non-specific are most compatible with chronic small vessel ischemic disease. No acute large vascular territory infarcts. No abnormal extra-axial fluid collections. Basal cisterns are patent. VASCULAR: Moderate to severe calcific atherosclerosis of the carotid siphons and included vertebral arteries. SKULL: No skull fracture. No significant scalp soft tissue swelling. SINUSES/ORBITS: Trace paranasal sinus mucosal thickening. Mastoid air cells are well aerated.The included ocular globes and orbital contents are non-suspicious. OTHER: None. CT CERVICAL SPINE FINDINGS ALIGNMENT: Maintained lordosis. Vertebral bodies in alignment. SKULL BASE AND VERTEBRAE: Cervical vertebral bodies and posterior elements are intact. Moderate C4-5 and C5-6 disc height loss and endplate spurring compatible with degenerative discs. No destructive bony lesions. C1-2 articulation maintained. SOFT TISSUES AND SPINAL CANAL: Nonacute. Partially imaged subclavian artery stent severe calcific atherosclerosis RIGHT carotid bifurcation. Ectatic LEFT carotid bifurcation consistent with endarterectomy. DISC LEVELS: No significant osseous canal stenosis. Moderate RIGHT C4-5, mild C5-6 neural foraminal narrowing. UPPER CHEST: Lung apices are clear. OTHER: None. IMPRESSION: CT HEAD: 1. No acute intracranial process. 2.  Mild parenchymal brain volume loss. 3. Moderate to severe atherosclerosis. CT CERVICAL SPINE: 1. No acute fracture or malalignment. 2. Moderate RIGHT C4-5 neural foraminal narrowing. 3. Severe calcific atherosclerosis RIGHT carotid bifurcation can result in hemodynamically significant stenosis. Status post LEFT carotid endarterectomy. Electronically Signed   By: Elon Alas M.D.   On: 04/09/2018 19:20   Dg Chest Port 1 View  Result Date: 04/18/2018 CLINICAL DATA:  Pulmonary edema. EXAM: PORTABLE CHEST 1 VIEW COMPARISON:  04/17/2018. FINDINGS: Prior CABG and cardiac valve replacement. Left atrial appendage clip noted. Cardiomegaly with diffuse bilateral pulmonary interstitial prominence. Pulmonary interstitial prominence is improved from prior exam. Small bilateral pleural effusions again noted. No pneumothorax. IMPRESSION: Prior CABG and cardiac valve replacement. Stable cardiomegaly. Interim improvement of interstitial prominence. Tiny bilateral pleural effusions. Findings suggest improving CHF. Electronically Signed   By: Marcello Moores  Register   On: 04/18/2018 06:59   Dg Chest Port 1 View  Result Date: 04/17/2018 CLINICAL DATA:  Preoperative respiratory examination. EXAM: PORTABLE CHEST 1 VIEW COMPARISON:  01/22/2018 chest radiograph FINDINGS: Cardiomegaly, CABG/mitral valve replacement and LEFT atrial appendage clip again noted. Mild interstitial pulmonary edema and trace bilateral pleural effusions are noted. There is no evidence of pneumothorax. No acute bony abnormalities are present. IMPRESSION: Cardiomegaly with mild interstitial pulmonary edema and trace bilateral pleural effusions. Electronically Signed   By: Margarette Canada M.D.   On: 04/17/2018 09:24   US Abdomen Limited Ruq  Result Date: 04/13/2018 CLINICAL DATA:  Pancreatitis for 1 day. History of appendectomy and AAA repair. Chronic kidney disease stage 5. COPD. Hypertension. EXAM: ULTRASOUND ABDOMEN LIMITED RIGHT UPPER QUADRANT COMPARISON:   CT of the abdomen and pelvis on 04/12/2018 FINDINGS: Gallbladder: Gallbladder wall is thickened, 4.8 millimeters. There  is fluid within the gallbladder wall and a small amount of pericholecystic fluid. Gallstones are present, measuring approximately 5 millimeters in diameter. Gallbladder sludge is also noted. Patient is tender throughout the RIGHT UPPER QUADRANT, nonspecific for acute cholecystitis. Common bile duct: Diameter: 4.0 millimeter Liver: No focal lesion identified. Within normal limits in parenchymal echogenicity. Portal vein is patent on color Doppler imaging with normal direction of blood flow towards the liver. IMPRESSION: 1. Gallstones and gallbladder sludge. 2. Fluid within a thickened gallbladder wall, suspicious for gangrenous cholecystitis. Nonfocal tenderness throughout the RIGHT UPPER QUADRANT during exam. 3. Small amount of perihepatic fluid. These results were called by telephone at the time of interpretation on 04/13/2018 at 9:08 pm to [facility] with [facility] who verbally acknowledged these results. Electronically Signed   By: Nolon Nations M.D.   On: 04/13/2018 21:09     LOS: 8 days   Oren Binet, MD  [facility]  If 7PM-7AM, please contact night-coverage  Please page via [email or web address]-click on MD name and type text message  04/20/2018, 3:09 PM

## 2018-04-21 DIAGNOSIS — N184 Chronic kidney disease, stage 4 (severe): Secondary | ICD-10-CM

## 2018-04-21 NOTE — Care Management Note (Signed)
Case Management Note  Patient Details  Name: [name] MRN: [id number] Date of Birth: April 15, 1951  Subjective/Objective:                    Action/Plan:  Patient has home oxygen through [facility]. Patient to discharge today and needs portable tank. Called Dan with [facility]. Expected Discharge Date:  04/21/18               Expected Discharge Plan:  [place]  In-House Referral:     Discharge planning Services  CM Consult  Post Acute Care Choice:  Home Health Choice offered to:  Patient  DME Arranged:  Oxygen DME Agency:  [place]:  PT [facility] Agency:  [facility]  Status of Service:  Completed, signed off  If discussed at [place] of Stay Meetings, dates discussed:    Additional Comments:  [name], RN 04/21/2018, 2:43 PM

## 2018-04-21 NOTE — Progress Notes (Signed)
Patient discharged home with instructions and O2 portable  Tank, transported by daughter.

## 2018-04-21 NOTE — Progress Notes (Signed)
Physical Therapy Treatment Patient Details Name: [name] MRN: [id number] DOB: Aug 30, 1951 Today's Date: 04/21/2018    History of Present Illness 67 year old woman with medical problems including chronic kidney disease stage 5, CAD status post CABG in 2012, repair of abdominal aortic aneurysm in May 2019, COPD, nocturnal use of oxygen, chronic lumbar back pain, and hypertension presenting with weakness.    PT Comments    On arrival to room pt beginning d/c process. Daughter present requesting pt get up to ambulate and be assessed for neccessary supervision. Pt showing improved mobility today and required less assist with ambulation. Home oxygen study performed as pt desats at rest and with mobility. See below for SpO2 details. Daughter reports she feels pt is ambulating at baseline with RW. Pt expected to d/c home with HHPT to follow up and intermittent supervision provided by family.   SATURATION QUALIFICATIONS: (This note is used to comply with regulatory documentation for home oxygen)  Patient Saturations on Room Air at Rest = 84  Patient Saturations on Room Air while Ambulating = 78  Patient Saturations on 3 Liters of oxygen while Ambulating = 90  Please briefly explain why patient needs home oxygen: Pt desats at rest and with activity on RA.   Follow Up Recommendations  Home health PT     Equipment Recommendations  None recommended by PT    Recommendations for Other Services       Precautions / Restrictions Precautions Precautions: Fall Precaution Comments: abd incision Restrictions Weight Bearing Restrictions: No    Mobility  Bed Mobility Overal bed mobility: Needs Assistance Bed Mobility: Sit to Supine       Sit to supine: Supervision;HOB elevated   General bed mobility comments: min guard for safety. No assist required  Transfers Overall transfer level: Needs assistance Equipment used: Rolling walker (2 wheeled) Transfers: Sit to/from Stand Sit to  Stand: Min guard         General transfer comment: min guard for safety and cues for hand placement with RW  Ambulation/Gait Ambulation/Gait assistance: Supervision Gait Distance (Feet): 200 Feet Assistive device: Rolling walker (2 wheeled) Gait Pattern/deviations: Step-through pattern;Trunk flexed Gait velocity: decreased   General Gait Details: Cues for postural control. Pt's SpO2 sats dropping to 78% on RA with ambulation. Overall steady with RW.   Stairs             Wheelchair Mobility    Modified Rankin (Stroke Patients Only)       Balance Overall balance assessment: Needs assistance Sitting-balance support: Feet supported Sitting balance-Leahy Scale: Normal       Standing balance-Leahy Scale: Fair Standing balance comment: reliant on UE support                            Cognition Arousal/Alertness: Awake/alert Behavior During Therapy: WFL for tasks assessed/performed Overall Cognitive Status: Within Functional Limits for tasks assessed                                        Exercises      General Comments General comments (skin integrity, edema, etc.): Daughter present. Addressed questions about d/c and pt supervision.      Pertinent Vitals/Pain Pain Assessment: Faces Faces Pain Scale: Hurts little more Pain Location: abdomen Pain Descriptors / Indicators: Discomfort Pain Intervention(s): Monitored during session;Limited activity within patient's tolerance;Repositioned  Home Living                      Prior Function            PT Goals (current goals can now be found in the care plan section) Acute Rehab PT Goals Patient Stated Goal: Go home PT Goal Formulation: With patient Time For Goal Achievement: 04/24/18 Potential to Achieve Goals: Good Progress towards PT goals: Progressing toward goals    Frequency    Min 3X/week      PT Plan Current plan remains appropriate    Co-evaluation               AM-PAC PT "6 Clicks" Daily Activity  Outcome Measure  Difficulty turning over in bed (including adjusting bedclothes, sheets and blankets)?: None Difficulty moving from lying on back to sitting on the side of the bed? : A Little Difficulty sitting down on and standing up from a chair with arms (e.g., wheelchair, bedside commode, etc,.)?: A Little Help needed moving to and from a bed to chair (including a wheelchair)?: A Little Help needed walking in hospital room?: A Little Help needed climbing 3-5 steps with a railing? : A Little 6 Click Score: 19    End of Session Equipment Utilized During Treatment: Gait belt;Oxygen Activity Tolerance: Patient tolerated treatment well Patient left: in chair;with family/visitor present;with nursing/sitter in room Nurse Communication: Mobility status(desats) PT Visit Diagnosis: Unsteadiness on feet (R26.81);Difficulty in walking, not elsewhere classified (R26.2);Muscle weakness (generalized) (M62.81)     Time: 3532-9924 PT Time Calculation (min) (ACUTE ONLY): 28 min  Charges:  $Gait Training: 8-22 mins $Self Care/Home Management: [place], PTA Pager [phone] Acute Rehab   Allena Katz 04/21/2018, 3:02 PM

## 2018-04-21 NOTE — Progress Notes (Signed)
Patient ID: [name], female   DOB: 08/07/1951, 67 y.o.   MRN: [id number]    2 Days Post-Op  Subjective: Pt states she feels better this am.  Eating solid food, just doesn't like hospital food.  No nausea.  Objective: Vital signs in last 24 hours: Temp:  [97.7 F (36.5 C)-98.7 F (37.1 C)] 98.2 F (36.8 C) (08/16 0502) Pulse Rate:  [60-70] 60 (08/16 0502) Resp:  [12-18] 12 (08/16 0502) BP: (138-174)/(40-52) 138/43 (08/16 0502) SpO2:  [90 %-98 %] 96 % (08/16 0738) Weight:  [64.4 kg] 64.4 kg (08/15 2032) Last BM Date: 04/18/18(per patient verbalization)  Intake/Output from previous day: 08/15 0701 - 08/16 0700 In: 300 [P.O.:300] Out: -  Intake/Output this shift: No intake/output data recorded.  PE: Abd: soft, appropriately tender, +BS, ND, incisions c/d/i  Lab Results:  Recent Labs    04/19/18 0507 04/20/18 0450  WBC 8.0 8.3  HGB 8.4* 8.3*  HCT 27.5* 27.9*  PLT 228 220   BMET Recent Labs    04/19/18 0507 04/20/18 0450  NA 139 140  K 3.9 4.1  CL 102 102  CO2 24 23  GLUCOSE 110* 91  BUN 21 24*  CREATININE 2.13* 2.36*  CALCIUM 9.1 9.0   PT/INR No results for input(s): LABPROT, INR in the last 72 hours. CMP     Component Value Date/Time   NA 140 04/20/2018 0450   K 4.1 04/20/2018 0450   CL 102 04/20/2018 0450   CO2 23 04/20/2018 0450   GLUCOSE 91 04/20/2018 0450   BUN 24 (H) 04/20/2018 0450   CREATININE 2.36 (H) 04/20/2018 0450   CREATININE 1.32 (H) 06/17/2015 1257   CALCIUM 9.0 04/20/2018 0450   PROT 4.8 (L) 04/20/2018 0450   ALBUMIN 2.2 (L) 04/20/2018 0450   AST 28 04/20/2018 0450   ALT 9 04/20/2018 0450   ALKPHOS 46 04/20/2018 0450   BILITOT 1.2 04/20/2018 0450   GFRNONAA 20 (L) 04/20/2018 0450   GFRAA 24 (L) 04/20/2018 0450   Lipase     Component Value Date/Time   LIPASE 88 (H) 04/20/2018 0450       Studies/Results: No results found.  Anti-infectives: Anti-infectives (From admission, onward)   Start     Dose/Rate Route  Frequency Ordered Stop   04/14/18 1100  cefTRIAXone (ROCEPHIN) 2 g in sodium chloride 0.9 % 100 mL IVPB  Status:  Discontinued    Note to Pharmacy:  Pharmacy may adjust dosing strength / duration / interval for maximal efficacy - dose checked and appropriate   2 g 200 mL/hr over 30 Minutes Intravenous Every 24 hours 04/14/18 1018 04/20/18 0923       Assessment/Plan POD 1 lap chole for gsp- [place] -doing well -stable for DC home -follow up has been arranged for the patient -will need GI follow up as outpatient for blood per rectum  FEN - heart healthy diet VTE - SCDs/Plavix ID - none   LOS: 9 days    Henreitta Cea , [facility] 04/21/2018, 8:33 AM Pager: [phone]

## 2018-04-21 NOTE — Discharge Summary (Signed)
PATIENT DETAILS Name: GELILA WELL Age: 67 y.o. Sex: female Date of Birth: 1950-11-06 MRN: [id number]. Admitting Physician: Vilma Prader, MD ACZ:[id number], Shanon Brow, MD  Admit Date: 04/09/2018 Discharge date: 04/21/2018  Recommendations for Outpatient Follow-up:  1. Follow up with PCP in 1-2 weeks 2. Please obtain BMP/CBC in one week 3. Please ensure follow-up with GI, general surgery  Admitted From:  Home  Disposition: Home with home health [place]: Yes  Equipment/Devices: None  Discharge Condition: Stable  CODE STATUS: FULL CODE  Diet recommendation:  Heart Healthy   Brief Summary: See H&P, Labs, Consult and Test reports for all details in brief, Patient is a 67 y.o. female with history of CKD stage V, CAD status post CABG 2012, repair of AAA in May 2019, COPD on nocturnal home O2, hypertension, presented with weakness, she was found to have hemoglobin of 7.3 with concerns for GI bleeding, subsequent hospital course complicated by gallstone pancreatitis, volume overload/decompensated heart failure.  She was transfused 1 unit of PRBC underwent EGD that did not show any bleeding source.  Volume overload was managed with diuretics, pancreatitis was managed with supportive care.  She significantly improved, and underwent laparoscopic cholecystectomy on 8/14.  See below for further details.  Brief Hospital Course: Symptomatic anemia secondary to possible GI bleeding (maroon stools): Underwent PRBC transfusion on admission.  Hemoglobin stable.  EGD unrevealing for any foci of bleeding.  No further recommendations from gastroenterology.  Apparently patient has had unsuccessful colonoscopies in the past due to muscular hypertrophy of the bowel.  Follow-up with GI in the outpatient setting.   Gallstone pancreatitis: Resolved, underwent laparoscopic cholecystectomy on 8/14.    Diet has been advanced-she continues to have some amount of pain at the operative site-she  is to resume her usual narcotic regimen on discharge.  She appears to have some amount of mild anxiety playing a role in the symptoms as well.  Please follow-up with general surgery as instructed.  Her abdomen is soft and very benign appearing on exam.    Diastolic heart failure/volume overload: Likely secondary to IV fluid resuscitation for pancreatitis and GI bleeding.  She is now euvolemic following diuretic regimen.  Continue to follow volume status, electrolytes and weights closely.   CAD: Currently without any anginal symptoms, Plavix was on hold due to concern for GI bleeding and then subsequently for laparoscopic cholecystectomy, since hemoglobin stable-resumed Plavix postoperatively (okay with surgery as well).  Continue with beta-blocker and statin.    Status post AAA repair in 2019: Stable, continue with outpatient follow-up with vascular surgery.    CKD stage V: Creatinine close to usual baseline-continue outpatient follow-up with nephrology (Dr. Posey Pronto)  Hypertension: Controlled, continue Coreg, amlodipine, Imdur, hydralazine and clonidine.   Gout: No flare, continue with allopurinol  COPD: Stable-continue with bronchodilators  GERD: Continue PPI  Procedures/Studies: 8/14>> laparoscopic cholecystectomy 8/7>> EGD  Discharge Diagnoses:  Principal Problem:   Symptomatic anemia Active Problems:   TOBACCO ABUSE   CKD (chronic kidney disease)   COPD (chronic obstructive pulmonary disease) (HCC)   S/P CABG x 3   S/P Maze operation for atrial fibrillation   S/P MVR (mitral valve repair)   AAA (abdominal aortic aneurysm) without rupture (HCC)   Anemia   GERD (gastroesophageal reflux disease)   PAD (peripheral artery disease) (HCC)   Type II diabetes mellitus (HCC)   Hyperlipidemia   CAD (coronary artery disease)   Chronic kidney disease (CKD), stage IV (severe) ([place])   Acute  blood loss anemia   Fatigue associated with anemia   Acute gallstone pancreatitis   Acute  cholecystitis   Gastrointestinal hemorrhage   Volume overload   Discharge Instructions:  Activity:  As tolerated with Full fall precautions use walker/cane & assistance as needed   Discharge Instructions    Call MD for:  redness, tenderness, or signs of infection (pain, swelling, redness, odor or green/yellow discharge around incision site)   Complete by:  As directed    Diet - low sodium heart healthy   Complete by:  As directed    Discharge instructions   Complete by:  As directed    Follow with Primary MD  Bernerd Limbo, MD in 1 week  Follow with GI MD Dr [name] in 2 weeks  Follow with Gen Surgery as instructed  Please get a complete blood count and chemistry panel checked by your Primary MD at your next visit, and again as instructed by your Primary MD.  Get Medicines reviewed and adjusted: Please take all your medications with you for your next visit with your Primary MD  Laboratory/radiological data: Please request your Primary MD to go over all hospital tests and procedure/radiological results at the follow up, please ask your Primary MD to get all Hospital records sent to his/her office.  In some cases, they will be blood work, cultures and biopsy results pending at the time of your discharge. Please request that your primary care M.D. follows up on these results.  Also Note the following: If you experience worsening of your admission symptoms, develop shortness of breath, life threatening emergency, suicidal or homicidal thoughts you must seek medical attention immediately by calling 911 or calling your MD immediately  if symptoms less severe.  You must read complete instructions/literature along with all the possible adverse reactions/side effects for all the Medicines you take and that have been prescribed to you. Take any new Medicines after you have completely understood and accpet all the possible adverse reactions/side effects.   Do not drive when taking Pain  medications or sleeping medications (Benzodaizepines)  Do not take more than prescribed Pain, Sleep and Anxiety Medications. It is not advisable to combine anxiety,sleep and pain medications without talking with your primary care practitioner  Special Instructions: If you have smoked or chewed Tobacco  in the last 2 yrs please stop smoking, stop any regular Alcohol  and or any Recreational drug use.  Wear Seat belts while driving.  Please note: You were cared for by a hospitalist during your hospital stay. Once you are discharged, your primary care physician will handle any further medical issues. Please note that NO REFILLS for any discharge medications will be authorized once you are discharged, as it is imperative that you return to your primary care physician (or establish a relationship with a primary care physician if you do not have one) for your post hospital discharge needs so that they can reassess your need for medications and monitor your lab values.   Increase activity slowly   Complete by:  As directed      Allergies as of 04/21/2018      Reactions   Isosorbide Other (See Comments)   UNSPECIFIED REACTION - okay in low doses   Codeine Itching   Contrast Media [iodinated Diagnostic Agents] Itching, Other (See Comments)   Itching of feet   Ioxaglate Itching, Other (See Comments)   Itching of feet   Metrizamide Itching, Other (See Comments)   Itching of feet   Percocet [  oxycodone-acetaminophen] Itching, Other (See Comments)   Tolerates Hydrocodone      Medication List    TAKE these medications   albuterol 108 (90 Base) MCG/ACT inhaler Commonly known as:  PROVENTIL HFA;VENTOLIN HFA Inhale 2 puffs into the lungs every 4 (four) hours as needed for wheezing or shortness of breath. ProAir   allopurinol 100 MG tablet Commonly known as:  ZYLOPRIM Take 100 mg by mouth daily.   ALPRAZolam 0.5 MG tablet Commonly known as:  XANAX Take 0.25 mg by mouth at bedtime as needed for  anxiety.   amLODipine 10 MG tablet Commonly known as:  NORVASC Take 10 mg by mouth daily.   atorvastatin 40 MG tablet Commonly known as:  LIPITOR Take 40 mg by mouth every evening.   BESIVANCE 0.6 % Susp Generic drug:  Besifloxacin HCl Place 1 drop into the right eye 3 (three) times daily.   carvedilol 25 MG tablet Commonly known as:  COREG TAKE 1 TABLET BY MOUTH 2 TIMES A DAY WITH A MEAL What changed:  See the new instructions.   cloNIDine 0.2 MG tablet Commonly known as:  CATAPRES Take 1 tablet (0.2 mg total) by mouth 3 (three) times daily.   clopidogrel 75 MG tablet Commonly known as:  PLAVIX Take 1 tablet (75 mg total) by mouth daily with breakfast.   docusate sodium 100 MG capsule Commonly known as:  COLACE Take 200 mg by mouth at bedtime as needed for mild constipation.   DULoxetine 60 MG capsule Commonly known as:  CYMBALTA Take one capsule (60 mg dose) by mouth daily.   DUREZOL 0.05 % Emul Generic drug:  Difluprednate Place 1 drop into the right eye daily.   famotidine 20 MG tablet Commonly known as:  PEPCID TAKE 1 TABLET (20 MG TOTAL) BY MOUTH DAILY BEFORE SUPPER.   fenofibrate 160 MG tablet Take 1 tablet (160 mg total) by mouth daily.   furosemide 40 MG tablet Commonly known as:  LASIX Take 40 mg by mouth daily. May take an additional tablet day as needed for weight gain   gabapentin 300 MG capsule Commonly known as:  NEURONTIN Take 1 capsule (300 mg total) by mouth 2 (two) times daily.   hydrALAZINE 100 MG tablet Commonly known as:  APRESOLINE Take 100 mg by mouth 3 (three) times daily.   HYDROcodone-acetaminophen 5-325 MG tablet Commonly known as:  NORCO/VICODIN Take 1 tablet by mouth every 6 (six) hours as needed for moderate pain. What changed:  when to take this   Iron 28 MG Tabs Take 28 mg by mouth 2 (two) times daily.   isosorbide mononitrate 30 MG 24 hr tablet Commonly known as:  IMDUR Take 0.5 tablets (15 mg total) by mouth  daily.   mirtazapine 7.5 MG tablet Commonly known as:  REMERON Take 7.5 mg by mouth at bedtime.   nitroGLYCERIN 0.2 mg/hr patch Commonly known as:  NITRODUR - Dosed in mg/24 hr Place 1 patch (0.2 mg total) onto the skin daily. Remove old patch before applying new patch. Apply around the same time every day.   pantoprazole 40 MG tablet Commonly known as:  PROTONIX Take 1 tablet (40 mg total) by mouth daily.   Potassium 99 MG Tabs Take 99 mg by mouth daily.   PROLENSA 0.07 % Soln Generic drug:  Bromfenac Sodium Place 1 drop into the right eye daily.   promethazine 12.5 MG tablet Commonly known as:  PHENERGAN Take 1 tablet (12.5 mg total) by mouth every 6 (six)  hours as needed for nausea or vomiting.   SOOTHE XP OP Apply 1-2 drops to eye 3 (three) times daily as needed (irration).   Vitamin D3 5000 units Tabs Take 5,000 Units by mouth daily.   zolpidem 10 MG tablet Commonly known as:  AMBIEN Take 10 mg by mouth at bedtime.      Follow-up Information    [facility].   Specialty:  Home Health Services Contact information: [place] [address] [phone]        Willia Craze, NP Follow up on 04/28/2018.   Specialty:  Gastroenterology Why:  3pm. Please arrive 10 minutes early Contact information: [place] [address] [phone]        [facility], [place]. Go on 05/02/2018.   Specialty:  General Surgery Why:  at 11:00 AM for post-operative follow-up from gallbladder surgery. please arrive 30 minutes early. Contact information: [address] [place] [phone]       Bernerd Limbo, MD. Schedule an appointment as soon as possible for a visit in 1 week(s).   Specialty:  Family Medicine Contact information: [name] [address] [phone]        Elmarie Shiley, MD.  Schedule an appointment as soon as possible for a visit in 2 week(s).   Specialty:  Nephrology Contact information: [place] [address] [phone]          Allergies  Allergen Reactions  . Isosorbide Other (See Comments)    UNSPECIFIED REACTION - okay in low doses  . Codeine Itching  . Contrast Media [Iodinated Diagnostic Agents] Itching and Other (See Comments)    Itching of feet  . Ioxaglate Itching and Other (See Comments)    Itching of feet  . Metrizamide Itching and Other (See Comments)    Itching of feet  . Percocet [Oxycodone-Acetaminophen] Itching and Other (See Comments)    Tolerates Hydrocodone    Consultations:  Vascular: Dr. Trula Slade 04/12/2018  Gastroenterology: Dr. [name] 04/11/2018  General surgery: Dr. Rosendo Gros 04/14/2018   Other Procedures/Studies: Ct Abdomen Pelvis Wo Contrast  Result Date: 04/18/2018 CLINICAL DATA:  Acute pancreatitis. Anemia. In stage renal disease. Cardiac disease. Aortic aneurysm repair in May 2018. COPD. Nighttime oxygen use. EXAM: CT ABDOMEN AND PELVIS WITHOUT CONTRAST TECHNIQUE: Multidetector CT imaging of the abdomen and pelvis was performed following the standard protocol without IV contrast. COMPARISON:  04/12/2017 FINDINGS: Lower chest: Small right and trace left pleural effusions with associated passive atelectasis. Mild airway thickening in the lower lobes. Low-density blood pool suggesting anemia.  Mild cardiomegaly. Coronary atherosclerosis. Mitral valve prosthesis. Descending aortic atherosclerotic vascular disease. Hepatobiliary: Dependent density in the gallbladder probably from sludge, less likely gallstones. Pancreas: As noted on the prior exam there is considerable indistinctness of the pancreas as well as the pancreaticoduodenal groove potentially from focal pancreatitis or mass. A vaguely seen hypodense lesion in the area of the pancreatic head measures 1.9 by 1.2 cm on image 34/3, previously the same by my  measurements. There is no gas in the pancreas or within this lesion. There is minimal stranding around the rest of the pancreas. Spleen: Unremarkable Adrenals/Urinary Tract: Adrenal glands unremarkable. Vascular calcifications in both renal hila. 1.1 cm exophytic lesion of the left kidney lower pole with internal density of 0 Hounsfield units, probably a cyst but technically nonspecific. Cannot exclude nonobstructive renal calculi given  the peripheral calcifications along the renal hila. Stents in both proximal renal arteries. No hydronephrosis. Urinary bladder unremarkable. In the left lower perirenal space, there is an evolving hematoma which currently measures 5.5 by 4.0 cm, formerly 5.4 by 4.0 cm. A smaller loculation tracks down along the left adnexa. This was previously larger and of higher density for example on 01/19/2018. Stomach/Bowel: Wall thickening in the duodenal bulb and descending duodenum in the vicinity of the presumed pancreatitis. Postoperative findings in the rectum. Vascular/Lymphatic: Aortoiliac atherosclerotic vascular disease. Abdominal aortic aneurysm traversed by aorta bi-iliac stent graft. Today's noncontrast exam does not assess for patency or endoleak. The original aneurysm measures up to 5.1 cm anterior-posterior. No pathologic adenopathy is appreciated. Reproductive: Uterus absent. Lobularity along the left adnexa likely related to the original perirenal space hematoma. Other: Diffuse subcutaneous edema along the abdomen/pelvis. Musculoskeletal: No acute bony findings. IMPRESSION: 1. Very similar appearance of indistinct enlargement of the pancreatic head with obscuration of surrounding fat planes along the duodenum, as well as duodenal wall thickening. This is probably from pancreatitis, neoplasm is considered less likely. Stable 1.9 by 1.2 cm hypodense lesion within the pancreatic head, I favor pseudocyst although abscess is not entirely excluded. Today's exam was performed without  IV contrast, which lower sensitivity for complications such as pancreatic necrosis or abscess. No extraluminal gas or gas within the pancreas seen. 2. Small right and trace left pleural effusions. 3. Airway thickening is present, suggesting bronchitis or reactive airways disease. 4. Low-density blood pool suggests anemia. 5. Mild cardiomegaly with coronary atherosclerosis. 6.  Aortic Atherosclerosis (ICD10-I70.0). 7. Gallstones versus sludge in the gallbladder. 8. Stable appearance of abdominal aortic aneurysm traversed by aorta bi-iliac stent graft. 9. Prior left perirenal space hematoma extending down towards the left adnexa, with evolving blood products unchanged from 04/12/2018 but significantly reduced in size compared to examinations from may 2019. 10. Diffuse subcutaneous edema indicating third spacing of fluid. Electronically Signed   By: Van Clines M.D.   On: 04/18/2018 14:09   Ct Abdomen Pelvis Wo Contrast  Result Date: 04/12/2018 CLINICAL DATA:  67 year old with chronic renal insufficiency. Prior endovascular repair of abdominal aortic aneurysm in May, 2019. Patient currently with unexplained anemia and had a negative UPPER endoscopy earlier today. Evaluate for occult hemorrhage or hematoma, as the patient had a retroperitoneal hematoma after her endovascular abdominal aortic aneurysm repair. EXAM: CT ABDOMEN AND PELVIS WITHOUT CONTRAST TECHNIQUE: Multidetector CT imaging of the abdomen and pelvis was performed following the standard protocol without IV contrast. COMPARISON:  03/06/2018, 01/19/2018, 01/14/2018 and earlier. FINDINGS: Respiratory motion blurred images of the lung bases and the upper abdomen, making the examination less than optimal. Lower chest: Mild atelectasis involving the RIGHT LOWER LOBE. Visualized lung bases otherwise clear. Heart moderately enlarged with severe RIGHT coronary artery atherosclerosis. Hepatobiliary: Normal unenhanced appearance of the liver. High  attenuation bile/sludge and small gallstones within the gallbladder. No convincing pericholecystic inflammation. No biliary ductal dilation. Pancreas: Edema/inflammation surrounding the head of the pancreas with a 1.9 cm cyst in the head of the pancreas, not present on prior CTs. Mild edema/inflammation adjacent to the body of the pancreas. Spleen: Normal unenhanced appearance. Adrenals/Urinary Tract: Normal appearing RIGHT adrenal gland. Stable 1.2 cm low-attenuation LEFT adrenal nodule dating back to 2012. Small non-obstructing calculi involving UPPER pole calices of both kidneys. No urinary tract calculi elsewhere. No hydronephrosis involving either kidney. Within the limits of the unenhanced technique, no focal parenchymal abnormality involving either kidney. Stomach/Bowel: Stomach normal in appearance for  the degree of distention. Wall thickening involving the descending duodenum and the proximal transverse duodenum. Remainder of the small bowel normal in appearance. Descending colon, sigmoid colon and rectum decompressed. Surgical anastomotic suture material at the rectum, widely patent. Moderate stool burden involving the ascending colon. Surgically absent appendix. Vascular/Lymphatic: Prior endovascular abdominal aortic aneurysm repair with an aorto-bi-iliac stent graft. Native thrombosed aneurysm measures approximately 5.0 cm, unchanged since the CT 1 month ago and slightly decreased in size since May, 2019. 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 No pathologic lymphadenopathy. Reproductive: Surgically absent uterus.  No adnexal masses. Other: Approximate 5.4 x 4.0 x 7.1 cm fluid collection involving the LEFT retroperitoneum in the UPPER pelvis, further decreased in size since the CT 1 month ago, indicating a resolving retroperitoneal hematoma. No new hemorrhage or hematoma in the abdomen or pelvis. Musculoskeletal: Calcification  in the POSTERIOR annular fibers of protruded discs at L3-4 and L4-5 in this patient with 6 non-rib-bearing lumbar vertebrae. IMPRESSION: 1. Inflammatory changes involving the descending and proximal transverse duodenum and the pancreatic head and body. Pancreatitis with secondary inflammation of the duodenum is favored over duodenitis/duodenal ulcer with secondary pancreatitis. 2. Cholelithiasis and gallbladder sludge. No convincing evidence of acute cholecystitis. 3. No evidence of acute hemorrhage or hematoma in the abdomen or pelvis. Resolving LEFT retroperitoneal hematoma which has further decreased in size since 03/06/2018. 4. Endovascular abdominal aortic aneurysm repair. While endoleak cannot be entirely excluded without contrast administration, the fact that the thrombosed native aorta is stable in size suggests the absence of an endoleak. Electronically Signed   By: Evangeline Dakin M.D.   On: 04/12/2018 20:07   Ct Head Wo Contrast  Result Date: 04/09/2018 CLINICAL DATA:  Head injury July 17th, subsequent weakness. History of AV fistula, aortic stent graft, atrial fibrillation, diabetes. EXAM: CT HEAD WITHOUT CONTRAST CT CERVICAL SPINE WITHOUT CONTRAST TECHNIQUE: Multidetector CT imaging of the head and cervical spine was performed following the standard protocol without intravenous contrast. Multiplanar CT image reconstructions of the cervical spine were also generated. COMPARISON:  MRI of the head August 31, 2006 FINDINGS: CT HEAD FINDINGS BRAIN: No intraparenchymal hemorrhage, mass effect nor midline shift. Borderline parenchymal brain volume loss. Patchy supratentorial white matter hypodensities within normal range for patient's age, though non-specific are most compatible with chronic small vessel ischemic disease. No acute large vascular territory infarcts. No abnormal extra-axial fluid collections. Basal cisterns are patent. VASCULAR: Moderate to severe calcific atherosclerosis of the carotid  siphons and included vertebral arteries. SKULL: No skull fracture. No significant scalp soft tissue swelling. SINUSES/ORBITS: Trace paranasal sinus mucosal thickening. Mastoid air cells are well aerated.The included ocular globes and orbital contents are non-suspicious. OTHER: None. CT CERVICAL SPINE FINDINGS ALIGNMENT: Maintained lordosis. Vertebral bodies in alignment. SKULL BASE AND VERTEBRAE: Cervical vertebral bodies and posterior elements are intact. Moderate C4-5 and C5-6 disc height loss and endplate spurring compatible with degenerative discs. No destructive bony lesions. C1-2 articulation maintained. SOFT TISSUES AND SPINAL CANAL: Nonacute. Partially imaged subclavian artery stent severe calcific atherosclerosis RIGHT carotid bifurcation. Ectatic LEFT carotid bifurcation consistent with endarterectomy. DISC LEVELS: No significant osseous canal stenosis. Moderate RIGHT C4-5, mild C5-6 neural foraminal narrowing. UPPER CHEST: Lung apices are clear. OTHER: None. IMPRESSION: CT HEAD: 1. No acute intracranial process. 2. Mild parenchymal brain volume loss. 3. Moderate to severe atherosclerosis. CT CERVICAL SPINE: 1. No acute fracture or malalignment. 2. Moderate RIGHT C4-5 neural foraminal narrowing. 3. Severe calcific atherosclerosis RIGHT  carotid bifurcation can result in hemodynamically significant stenosis. Status post LEFT carotid endarterectomy. Electronically Signed   By: Elon Alas M.D.   On: 04/09/2018 19:20   Ct Cervical Spine Wo Contrast  Result Date: 04/09/2018 CLINICAL DATA:  Head injury July 17th, subsequent weakness. History of AV fistula, aortic stent graft, atrial fibrillation, diabetes. EXAM: CT HEAD WITHOUT CONTRAST CT CERVICAL SPINE WITHOUT CONTRAST TECHNIQUE: Multidetector CT imaging of the head and cervical spine was performed following the standard protocol without intravenous contrast. Multiplanar CT image reconstructions of the cervical spine were also generated. COMPARISON:   MRI of the head August 31, 2006 FINDINGS: CT HEAD FINDINGS BRAIN: No intraparenchymal hemorrhage, mass effect nor midline shift. Borderline parenchymal brain volume loss. Patchy supratentorial white matter hypodensities within normal range for patient's age, though non-specific are most compatible with chronic small vessel ischemic disease. No acute large vascular territory infarcts. No abnormal extra-axial fluid collections. Basal cisterns are patent. VASCULAR: Moderate to severe calcific atherosclerosis of the carotid siphons and included vertebral arteries. SKULL: No skull fracture. No significant scalp soft tissue swelling. SINUSES/ORBITS: Trace paranasal sinus mucosal thickening. Mastoid air cells are well aerated.The included ocular globes and orbital contents are non-suspicious. OTHER: None. CT CERVICAL SPINE FINDINGS ALIGNMENT: Maintained lordosis. Vertebral bodies in alignment. SKULL BASE AND VERTEBRAE: Cervical vertebral bodies and posterior elements are intact. Moderate C4-5 and C5-6 disc height loss and endplate spurring compatible with degenerative discs. No destructive bony lesions. C1-2 articulation maintained. SOFT TISSUES AND SPINAL CANAL: Nonacute. Partially imaged subclavian artery stent severe calcific atherosclerosis RIGHT carotid bifurcation. Ectatic LEFT carotid bifurcation consistent with endarterectomy. DISC LEVELS: No significant osseous canal stenosis. Moderate RIGHT C4-5, mild C5-6 neural foraminal narrowing. UPPER CHEST: Lung apices are clear. OTHER: None. IMPRESSION: CT HEAD: 1. No acute intracranial process. 2. Mild parenchymal brain volume loss. 3. Moderate to severe atherosclerosis. CT CERVICAL SPINE: 1. No acute fracture or malalignment. 2. Moderate RIGHT C4-5 neural foraminal narrowing. 3. Severe calcific atherosclerosis RIGHT carotid bifurcation can result in hemodynamically significant stenosis. Status post LEFT carotid endarterectomy. Electronically Signed   By: Elon Alas M.D.   On: 04/09/2018 19:20   Dg Chest Port 1 View  Result Date: 04/18/2018 CLINICAL DATA:  Pulmonary edema. EXAM: PORTABLE CHEST 1 VIEW COMPARISON:  04/17/2018. FINDINGS: Prior CABG and cardiac valve replacement. Left atrial appendage clip noted. Cardiomegaly with diffuse bilateral pulmonary interstitial prominence. Pulmonary interstitial prominence is improved from prior exam. Small bilateral pleural effusions again noted. No pneumothorax. IMPRESSION: Prior CABG and cardiac valve replacement. Stable cardiomegaly. Interim improvement of interstitial prominence. Tiny bilateral pleural effusions. Findings suggest improving CHF. Electronically Signed   By: Marcello Moores  Register   On: 04/18/2018 06:59   Dg Chest Port 1 View  Result Date: 04/17/2018 CLINICAL DATA:  Preoperative respiratory examination. EXAM: PORTABLE CHEST 1 VIEW COMPARISON:  01/22/2018 chest radiograph FINDINGS: Cardiomegaly, CABG/mitral valve replacement and LEFT atrial appendage clip again noted. Mild interstitial pulmonary edema and trace bilateral pleural effusions are noted. There is no evidence of pneumothorax. No acute bony abnormalities are present. IMPRESSION: Cardiomegaly with mild interstitial pulmonary edema and trace bilateral pleural effusions. Electronically Signed   By: Margarette Canada M.D.   On: 04/17/2018 09:24   US Abdomen Limited Ruq  Result Date: 04/13/2018 CLINICAL DATA:  Pancreatitis for 1 day. History of appendectomy and AAA repair. Chronic kidney disease stage 5. COPD. Hypertension. EXAM: ULTRASOUND ABDOMEN LIMITED RIGHT UPPER QUADRANT COMPARISON:  CT of the abdomen and pelvis on 04/12/2018 FINDINGS: Gallbladder: Gallbladder wall  is thickened, 4.8 millimeters. There is fluid within the gallbladder wall and a small amount of pericholecystic fluid. Gallstones are present, measuring approximately 5 millimeters in diameter. Gallbladder sludge is also noted. Patient is tender throughout the RIGHT UPPER QUADRANT,  nonspecific for acute cholecystitis. Common bile duct: Diameter: 4.0 millimeter Liver: No focal lesion identified. Within normal limits in parenchymal echogenicity. Portal vein is patent on color Doppler imaging with normal direction of blood flow towards the liver. IMPRESSION: 1. Gallstones and gallbladder sludge. 2. Fluid within a thickened gallbladder wall, suspicious for gangrenous cholecystitis. Nonfocal tenderness throughout the RIGHT UPPER QUADRANT during exam. 3. Small amount of perihepatic fluid. These results were called by telephone at the time of interpretation on 04/13/2018 at 9:08 pm to [facility] with [facility] who verbally acknowledged these results. Electronically Signed   By: Nolon Nations M.D.   On: 04/13/2018 21:09     TODAY-DAY OF DISCHARGE:  Subjective:   Lorah Kalina today has no headache,no chest  pain,no new weakness tingling or numbness, feels much better wants to go home today.   Objective:   Blood pressure (!) 138/43, pulse 60, temperature 98.2 F (36.8 C), temperature source Oral, resp. rate 12, height 5\' 5"  (1.651 m), weight 64.4 kg, SpO2 96 %.  Intake/Output Summary (Last 24 hours) at 04/21/2018 1045 Last data filed at 04/20/2018 1915 Gross per 24 hour  Intake 300 ml  Output -  Net 300 ml   Filed Weights   04/19/18 0521 04/19/18 0940 04/20/18 2032  Weight: 62.9 kg 62.9 kg 64.4 kg    Exam: Awake Alert, Oriented *3, No new F.N deficits, Normal affect [place].AT,PERRAL Supple Neck,No JVD, No cervical lymphadenopathy appriciated.  Symmetrical Chest wall movement, Good air movement bilaterally, CTAB RRR,No Gallops,Rubs or new Murmurs, No Parasternal Heave +ve B.Sounds, Abd Soft, mildly tender at the operative site, No organomegaly appriciated, No rebound -guarding or rigidity. No Cyanosis, Clubbing or edema, No new Rash or bruise   PERTINENT RADIOLOGIC STUDIES: Ct Abdomen Pelvis Wo Contrast  Result Date: 04/18/2018 CLINICAL DATA:  Acute pancreatitis.  Anemia. In stage renal disease. Cardiac disease. Aortic aneurysm repair in May 2018. COPD. Nighttime oxygen use. EXAM: CT ABDOMEN AND PELVIS WITHOUT CONTRAST TECHNIQUE: Multidetector CT imaging of the abdomen and pelvis was performed following the standard protocol without IV contrast. COMPARISON:  04/12/2017 FINDINGS: Lower chest: Small right and trace left pleural effusions with associated passive atelectasis. Mild airway thickening in the lower lobes. Low-density blood pool suggesting anemia.  Mild cardiomegaly. Coronary atherosclerosis. Mitral valve prosthesis. Descending aortic atherosclerotic vascular disease. Hepatobiliary: Dependent density in the gallbladder probably from sludge, less likely gallstones. Pancreas: As noted on the prior exam there is considerable indistinctness of the pancreas as well as the pancreaticoduodenal groove potentially from focal pancreatitis or mass. A vaguely seen hypodense lesion in the area of the pancreatic head measures 1.9 by 1.2 cm on image 34/3, previously the same by my measurements. There is no gas in the pancreas or within this lesion. There is minimal stranding around the rest of the pancreas. Spleen: Unremarkable Adrenals/Urinary Tract: Adrenal glands unremarkable. Vascular calcifications in both renal hila. 1.1 cm exophytic lesion of the left kidney lower pole with internal density of 0 Hounsfield units, probably a cyst but technically nonspecific. Cannot exclude nonobstructive renal calculi given the peripheral calcifications along the renal hila. Stents in both proximal renal arteries. No hydronephrosis. Urinary bladder unremarkable. In the left lower perirenal space, there is an evolving hematoma which currently measures 5.5 by  4.0 cm, formerly 5.4 by 4.0 cm. A smaller loculation tracks down along the left adnexa. This was previously larger and of higher density for example on 01/19/2018. Stomach/Bowel: Wall thickening in the duodenal bulb and descending duodenum  in the vicinity of the presumed pancreatitis. Postoperative findings in the rectum. Vascular/Lymphatic: Aortoiliac atherosclerotic vascular disease. Abdominal aortic aneurysm traversed by aorta bi-iliac stent graft. Today's noncontrast exam does not assess for patency or endoleak. The original aneurysm measures up to 5.1 cm anterior-posterior. No pathologic adenopathy is appreciated. Reproductive: Uterus absent. Lobularity along the left adnexa likely related to the original perirenal space hematoma. Other: Diffuse subcutaneous edema along the abdomen/pelvis. Musculoskeletal: No acute bony findings. IMPRESSION: 1. Very similar appearance of indistinct enlargement of the pancreatic head with obscuration of surrounding fat planes along the duodenum, as well as duodenal wall thickening. This is probably from pancreatitis, neoplasm is considered less likely. Stable 1.9 by 1.2 cm hypodense lesion within the pancreatic head, I favor pseudocyst although abscess is not entirely excluded. Today's exam was performed without IV contrast, which lower sensitivity for complications such as pancreatic necrosis or abscess. No extraluminal gas or gas within the pancreas seen. 2. Small right and trace left pleural effusions. 3. Airway thickening is present, suggesting bronchitis or reactive airways disease. 4. Low-density blood pool suggests anemia. 5. Mild cardiomegaly with coronary atherosclerosis. 6.  Aortic Atherosclerosis (ICD10-I70.0). 7. Gallstones versus sludge in the gallbladder. 8. Stable appearance of abdominal aortic aneurysm traversed by aorta bi-iliac stent graft. 9. Prior left perirenal space hematoma extending down towards the left adnexa, with evolving blood products unchanged from 04/12/2018 but significantly reduced in size compared to examinations from may 2019. 10. Diffuse subcutaneous edema indicating third spacing of fluid. Electronically Signed   By: Van Clines M.D.   On: 04/18/2018 14:09   Ct  Abdomen Pelvis Wo Contrast  Result Date: 04/12/2018 CLINICAL DATA:  67 year old with chronic renal insufficiency. Prior endovascular repair of abdominal aortic aneurysm in May, 2019. Patient currently with unexplained anemia and had a negative UPPER endoscopy earlier today. Evaluate for occult hemorrhage or hematoma, as the patient had a retroperitoneal hematoma after her endovascular abdominal aortic aneurysm repair. EXAM: CT ABDOMEN AND PELVIS WITHOUT CONTRAST TECHNIQUE: Multidetector CT imaging of the abdomen and pelvis was performed following the standard protocol without IV contrast. COMPARISON:  03/06/2018, 01/19/2018, 01/14/2018 and earlier. FINDINGS: Respiratory motion blurred images of the lung bases and the upper abdomen, making the examination less than optimal. Lower chest: Mild atelectasis involving the RIGHT LOWER LOBE. Visualized lung bases otherwise clear. Heart moderately enlarged with severe RIGHT coronary artery atherosclerosis. Hepatobiliary: Normal unenhanced appearance of the liver. High attenuation bile/sludge and small gallstones within the gallbladder. No convincing pericholecystic inflammation. No biliary ductal dilation. Pancreas: Edema/inflammation surrounding the head of the pancreas with a 1.9 cm cyst in the head of the pancreas, not present on prior CTs. Mild edema/inflammation adjacent to the body of the pancreas. Spleen: Normal unenhanced appearance. Adrenals/Urinary Tract: Normal appearing RIGHT adrenal gland. Stable 1.2 cm low-attenuation LEFT adrenal nodule dating back to 2012. Small non-obstructing calculi involving UPPER pole calices of both kidneys. No urinary tract calculi elsewhere. No hydronephrosis involving either kidney. Within the limits of the unenhanced technique, no focal parenchymal abnormality involving either kidney. Stomach/Bowel: Stomach normal in appearance for the degree of distention. Wall thickening involving the descending duodenum and the proximal  transverse duodenum. Remainder of the small bowel normal in appearance. Descending colon, sigmoid colon and rectum decompressed. Surgical anastomotic suture  material at the rectum, widely patent. Moderate stool burden involving the ascending colon. Surgically absent appendix. Vascular/Lymphatic: Prior endovascular abdominal aortic aneurysm repair with an aorto-bi-iliac stent graft. Native thrombosed aneurysm measures approximately 5.0 cm, unchanged since the CT 1 month ago and slightly decreased in size since May, 2019. 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 No pathologic lymphadenopathy. Reproductive: Surgically absent uterus.  No adnexal masses. Other: Approximate 5.4 x 4.0 x 7.1 cm fluid collection involving the LEFT retroperitoneum in the UPPER pelvis, further decreased in size since the CT 1 month ago, indicating a resolving retroperitoneal hematoma. No new hemorrhage or hematoma in the abdomen or pelvis. Musculoskeletal: Calcification in the POSTERIOR annular fibers of protruded discs at L3-4 and L4-5 in this patient with 6 non-rib-bearing lumbar vertebrae. IMPRESSION: 1. Inflammatory changes involving the descending and proximal transverse duodenum and the pancreatic head and body. Pancreatitis with secondary inflammation of the duodenum is favored over duodenitis/duodenal ulcer with secondary pancreatitis. 2. Cholelithiasis and gallbladder sludge. No convincing evidence of acute cholecystitis. 3. No evidence of acute hemorrhage or hematoma in the abdomen or pelvis. Resolving LEFT retroperitoneal hematoma which has further decreased in size since 03/06/2018. 4. Endovascular abdominal aortic aneurysm repair. While endoleak cannot be entirely excluded without contrast administration, the fact that the thrombosed native aorta is stable in size suggests the absence of an endoleak. Electronically Signed   By: Evangeline Dakin M.D.   On:  04/12/2018 20:07   Ct Head Wo Contrast  Result Date: 04/09/2018 CLINICAL DATA:  Head injury July 17th, subsequent weakness. History of AV fistula, aortic stent graft, atrial fibrillation, diabetes. EXAM: CT HEAD WITHOUT CONTRAST CT CERVICAL SPINE WITHOUT CONTRAST TECHNIQUE: Multidetector CT imaging of the head and cervical spine was performed following the standard protocol without intravenous contrast. Multiplanar CT image reconstructions of the cervical spine were also generated. COMPARISON:  MRI of the head August 31, 2006 FINDINGS: CT HEAD FINDINGS BRAIN: No intraparenchymal hemorrhage, mass effect nor midline shift. Borderline parenchymal brain volume loss. Patchy supratentorial white matter hypodensities within normal range for patient's age, though non-specific are most compatible with chronic small vessel ischemic disease. No acute large vascular territory infarcts. No abnormal extra-axial fluid collections. Basal cisterns are patent. VASCULAR: Moderate to severe calcific atherosclerosis of the carotid siphons and included vertebral arteries. SKULL: No skull fracture. No significant scalp soft tissue swelling. SINUSES/ORBITS: Trace paranasal sinus mucosal thickening. Mastoid air cells are well aerated.The included ocular globes and orbital contents are non-suspicious. OTHER: None. CT CERVICAL SPINE FINDINGS ALIGNMENT: Maintained lordosis. Vertebral bodies in alignment. SKULL BASE AND VERTEBRAE: Cervical vertebral bodies and posterior elements are intact. Moderate C4-5 and C5-6 disc height loss and endplate spurring compatible with degenerative discs. No destructive bony lesions. C1-2 articulation maintained. SOFT TISSUES AND SPINAL CANAL: Nonacute. Partially imaged subclavian artery stent severe calcific atherosclerosis RIGHT carotid bifurcation. Ectatic LEFT carotid bifurcation consistent with endarterectomy. DISC LEVELS: No significant osseous canal stenosis. Moderate RIGHT C4-5, mild C5-6 neural  foraminal narrowing. UPPER CHEST: Lung apices are clear. OTHER: None. IMPRESSION: CT HEAD: 1. No acute intracranial process. 2. Mild parenchymal brain volume loss. 3. Moderate to severe atherosclerosis. CT CERVICAL SPINE: 1. No acute fracture or malalignment. 2. Moderate RIGHT C4-5 neural foraminal narrowing. 3. Severe calcific atherosclerosis RIGHT carotid bifurcation can result in hemodynamically significant stenosis. Status post LEFT carotid endarterectomy. Electronically Signed   By: Elon Alas M.D.   On: 04/09/2018 19:20   Ct Cervical Spine Wo  Contrast  Result Date: 04/09/2018 CLINICAL DATA:  Head injury July 17th, subsequent weakness. History of AV fistula, aortic stent graft, atrial fibrillation, diabetes. EXAM: CT HEAD WITHOUT CONTRAST CT CERVICAL SPINE WITHOUT CONTRAST TECHNIQUE: Multidetector CT imaging of the head and cervical spine was performed following the standard protocol without intravenous contrast. Multiplanar CT image reconstructions of the cervical spine were also generated. COMPARISON:  MRI of the head August 31, 2006 FINDINGS: CT HEAD FINDINGS BRAIN: No intraparenchymal hemorrhage, mass effect nor midline shift. Borderline parenchymal brain volume loss. Patchy supratentorial white matter hypodensities within normal range for patient's age, though non-specific are most compatible with chronic small vessel ischemic disease. No acute large vascular territory infarcts. No abnormal extra-axial fluid collections. Basal cisterns are patent. VASCULAR: Moderate to severe calcific atherosclerosis of the carotid siphons and included vertebral arteries. SKULL: No skull fracture. No significant scalp soft tissue swelling. SINUSES/ORBITS: Trace paranasal sinus mucosal thickening. Mastoid air cells are well aerated.The included ocular globes and orbital contents are non-suspicious. OTHER: None. CT CERVICAL SPINE FINDINGS ALIGNMENT: Maintained lordosis. Vertebral bodies in alignment. SKULL BASE  AND VERTEBRAE: Cervical vertebral bodies and posterior elements are intact. Moderate C4-5 and C5-6 disc height loss and endplate spurring compatible with degenerative discs. No destructive bony lesions. C1-2 articulation maintained. SOFT TISSUES AND SPINAL CANAL: Nonacute. Partially imaged subclavian artery stent severe calcific atherosclerosis RIGHT carotid bifurcation. Ectatic LEFT carotid bifurcation consistent with endarterectomy. DISC LEVELS: No significant osseous canal stenosis. Moderate RIGHT C4-5, mild C5-6 neural foraminal narrowing. UPPER CHEST: Lung apices are clear. OTHER: None. IMPRESSION: CT HEAD: 1. No acute intracranial process. 2. Mild parenchymal brain volume loss. 3. Moderate to severe atherosclerosis. CT CERVICAL SPINE: 1. No acute fracture or malalignment. 2. Moderate RIGHT C4-5 neural foraminal narrowing. 3. Severe calcific atherosclerosis RIGHT carotid bifurcation can result in hemodynamically significant stenosis. Status post LEFT carotid endarterectomy. Electronically Signed   By: Elon Alas M.D.   On: 04/09/2018 19:20   Dg Chest Port 1 View  Result Date: 04/18/2018 CLINICAL DATA:  Pulmonary edema. EXAM: PORTABLE CHEST 1 VIEW COMPARISON:  04/17/2018. FINDINGS: Prior CABG and cardiac valve replacement. Left atrial appendage clip noted. Cardiomegaly with diffuse bilateral pulmonary interstitial prominence. Pulmonary interstitial prominence is improved from prior exam. Small bilateral pleural effusions again noted. No pneumothorax. IMPRESSION: Prior CABG and cardiac valve replacement. Stable cardiomegaly. Interim improvement of interstitial prominence. Tiny bilateral pleural effusions. Findings suggest improving CHF. Electronically Signed   By: Marcello Moores  Register   On: 04/18/2018 06:59   Dg Chest Port 1 View  Result Date: 04/17/2018 CLINICAL DATA:  Preoperative respiratory examination. EXAM: PORTABLE CHEST 1 VIEW COMPARISON:  01/22/2018 chest radiograph FINDINGS: Cardiomegaly,  CABG/mitral valve replacement and LEFT atrial appendage clip again noted. Mild interstitial pulmonary edema and trace bilateral pleural effusions are noted. There is no evidence of pneumothorax. No acute bony abnormalities are present. IMPRESSION: Cardiomegaly with mild interstitial pulmonary edema and trace bilateral pleural effusions. Electronically Signed   By: Margarette Canada M.D.   On: 04/17/2018 09:24   US Abdomen Limited Ruq  Result Date: 04/13/2018 CLINICAL DATA:  Pancreatitis for 1 day. History of appendectomy and AAA repair. Chronic kidney disease stage 5. COPD. Hypertension. EXAM: ULTRASOUND ABDOMEN LIMITED RIGHT UPPER QUADRANT COMPARISON:  CT of the abdomen and pelvis on 04/12/2018 FINDINGS: Gallbladder: Gallbladder wall is thickened, 4.8 millimeters. There is fluid within the gallbladder wall and a small amount of pericholecystic fluid. Gallstones are present, measuring approximately 5 millimeters in diameter. Gallbladder sludge is also noted. Patient  is tender throughout the RIGHT UPPER QUADRANT, nonspecific for acute cholecystitis. Common bile duct: Diameter: 4.0 millimeter Liver: No focal lesion identified. Within normal limits in parenchymal echogenicity. Portal vein is patent on color Doppler imaging with normal direction of blood flow towards the liver. IMPRESSION: 1. Gallstones and gallbladder sludge. 2. Fluid within a thickened gallbladder wall, suspicious for gangrenous cholecystitis. Nonfocal tenderness throughout the RIGHT UPPER QUADRANT during exam. 3. Small amount of perihepatic fluid. These results were called by telephone at the time of interpretation on 04/13/2018 at 9:08 pm to [facility] with [facility] who verbally acknowledged these results. Electronically Signed   By: Nolon Nations M.D.   On: 04/13/2018 21:09     PERTINENT LAB RESULTS: CBC: Recent Labs    04/19/18 0507 04/20/18 0450  WBC 8.0 8.3  HGB 8.4* 8.3*  HCT 27.5* 27.9*  PLT 228 220   CMET CMP      Component Value Date/Time   NA 140 04/20/2018 0450   K 4.1 04/20/2018 0450   CL 102 04/20/2018 0450   CO2 23 04/20/2018 0450   GLUCOSE 91 04/20/2018 0450   BUN 24 (H) 04/20/2018 0450   CREATININE 2.36 (H) 04/20/2018 0450   CREATININE 1.32 (H) 06/17/2015 1257   CALCIUM 9.0 04/20/2018 0450   PROT 4.8 (L) 04/20/2018 0450   ALBUMIN 2.2 (L) 04/20/2018 0450   AST 28 04/20/2018 0450   ALT 9 04/20/2018 0450   ALKPHOS 46 04/20/2018 0450   BILITOT 1.2 04/20/2018 0450   GFRNONAA 20 (L) 04/20/2018 0450   GFRAA 24 (L) 04/20/2018 0450    GFR Estimated Creatinine Clearance: 21.4 mL/min (A) (by C-G formula based on SCr of 2.36 mg/dL (H)). Recent Labs    04/19/18 0507 04/20/18 0450  LIPASE 90* 88*   No results for input(s): CKTOTAL, CKMB, CKMBINDEX, TROPONINI in the last 72 hours. Invalid input(s): POCBNP No results for input(s): DDIMER in the last 72 hours. No results for input(s): HGBA1C in the last 72 hours. No results for input(s): CHOL, HDL, LDLCALC, TRIG, CHOLHDL, LDLDIRECT in the last 72 hours. No results for input(s): TSH, T4TOTAL, T3FREE, THYROIDAB in the last 72 hours.  Invalid input(s): FREET3 No results for input(s): VITAMINB12, FOLATE, FERRITIN, TIBC, IRON, RETICCTPCT in the last 72 hours. Coags: No results for input(s): INR in the last 72 hours.  Invalid input(s): PT Microbiology: Recent Results (from the past 240 hour(s))  Surgical pcr screen     Status: None   Collection Time: 04/19/18  9:16 AM  Result Value Ref Range Status   MRSA, PCR NEGATIVE NEGATIVE Final   Staphylococcus aureus NEGATIVE NEGATIVE Final    Comment: (NOTE) The Xpert SA Assay (FDA approved for NASAL specimens in patients 38 years of age and older), is one component of a comprehensive surveillance program. It is not intended to diagnose infection nor to guide or monitor treatment. Performed at [facility], [place] [address], [place], [place] [address]     FURTHER DISCHARGE  INSTRUCTIONS:  Get Medicines reviewed and adjusted: Please take all your medications with you for your next visit with your Primary MD  Laboratory/radiological data: Please request your Primary MD to go over all hospital tests and procedure/radiological results at the follow up, please ask your Primary MD to get all Hospital records sent to his/her office.  In some cases, they will be blood work, cultures and biopsy results pending at the time of your discharge. Please request that your primary care M.D. goes through all  the records of your hospital data and follows up on these results.  Also Note the following: If you experience worsening of your admission symptoms, develop shortness of breath, life threatening emergency, suicidal or homicidal thoughts you must seek medical attention immediately by calling 911 or calling your MD immediately  if symptoms less severe.  You must read complete instructions/literature along with all the possible adverse reactions/side effects for all the Medicines you take and that have been prescribed to you. Take any new Medicines after you have completely understood and accpet all the possible adverse reactions/side effects.   Do not drive when taking Pain medications or sleeping medications (Benzodaizepines)  Do not take more than prescribed Pain, Sleep and Anxiety Medications. It is not advisable to combine anxiety,sleep and pain medications without talking with your primary care practitioner  Special Instructions: If you have smoked or chewed Tobacco  in the last 2 yrs please stop smoking, stop any regular Alcohol  and or any Recreational drug use.  Wear Seat belts while driving.  Please note: You were cared for by a hospitalist during your hospital stay. Once you are discharged, your primary care physician will handle any further medical issues. Please note that NO REFILLS for any discharge medications will be authorized once you are discharged, as it is  imperative that you return to your primary care physician (or establish a relationship with a primary care physician if you do not have one) for your post hospital discharge needs so that they can reassess your need for medications and monitor your lab values.  Total Time spent coordinating discharge including counseling, education and face to face time equals 45 minutes.  SignedOren Binet 04/21/2018 10:45 AM

## 2018-04-28 ENCOUNTER — Ambulatory Visit: Payer: Medicare Other | Admitting: Nurse Practitioner

## 2018-04-28 ENCOUNTER — Encounter: Payer: Self-pay | Admitting: Nurse Practitioner

## 2018-04-28 VITALS — BP 138/40 | HR 65 | Ht 65.0 in | Wt 133.0 lb

## 2018-04-28 DIAGNOSIS — K851 Biliary acute pancreatitis without necrosis or infection: Secondary | ICD-10-CM | POA: Diagnosis not present

## 2018-04-28 DIAGNOSIS — D649 Anemia, unspecified: Secondary | ICD-10-CM

## 2018-04-28 NOTE — Progress Notes (Signed)
P GI provider:  [name], MD     Chief Complaint:  Hospital follow up    IMPRESSION and PLAN:    #1. 67 yo female with recent hospital admission for acute on chronic anemia on Plavix.  Baseline hgb ~ 11, down to 9.5 in mid May following AAA surgery with subsequent large retroperitoneal hematoma. Then hosptialized 04/09/18 with fatigue,  further decline in hgb down to 6.8.  There was documentation of self reported maroon stools but this is not all together clear as patient can't really recall stool color but she was heme positive.  She did complain of upper back pain radiating around both sides into upper abdomen. Inpatient EGD unrevealing. Colonoscopy was not attempted given failed attempts in 2013 by previous GI. due to had significant colonic narrowing on flex sigmoidoscopy. She had a virtual colonoscopy in 2013, I couldn't find results. -Request records from previous GI , Dr. Dorrene German. I feel like she is going to need another attempt at colonoscopy given recent unexplained worsening of anemia. Will let her know after review of records.  -Patient says her hemoglobin was 9.6 three days ago.  She is scheduled for repeat labs including a CBC to be done by nephrologist on September 2 so no need to repeat labs today. have asked her to return for CBC sometime the end of September   #2.  Gallstones pancreatitis. Interestingly this occurred while hospitalized with worsening anemia. A CT scan done to exclude RP bleed incidentally found pancreatitis. Lipase correlated. She is s/p lap cholecystectomy 04/19/18.       HPI:     Patient is a 67 year old female with multiple medical problems not limited to CAD/stenting, CKD, peripheral vascular disease, and history of bowel resection possibly secondary to diverticular disease. Patient has a AAA repair mid May.  She subsequently developed a large retroperitoneal hematoma resulting in a decline from her baseline hemoglobin around 11 to the mid 9 range.   Patient presented to the emergency department first week of August with complaints of weakness.  She was found to have worsening anemia.  Her stools were Hemoccult positive in the ED.  Subsequent documentation mentioned that patient had reported maroon stools at home but talking with her now she cannot really substantiate that.  At any rate, GI was called in for evaluation of anemia and heme positive stools.  Patient required 2 units of blood.  She had an inpatient EGD which was basically unremarkable.  Inpatient colonoscopy was not attempted.  Patient had described at least 2 failed attempts at colonoscopy in 2013 by previous gastroenterologist.  Her GI history was unknown but she also describes some kind of a bowel resection, ? for diverticulitis.  In Care Everywhere we did find incomplete colonoscopy from 2013:   Flex sig with biopsy/incomplete colonoscopy -Unable to advance the scope past 30 cm -Convergence of folds at 30 cm consistent with her past history of having some type of muscular hypertrophy in this area probably  from diverticulitis -Mild sigmoid colon diverticulosis in the colon examined. 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 -Consider further work-up such as CT colonoscopy and capsule endoscopy to complete the work-up  As part of anemia work-up in the hospital patient had a CT scan to look for retroperitoneal bleed.  No bleed was found but she was incidentally found to have pancreatitis.  Lipase level was checked and elevated.  She did give a several  week history of upper abdominal pain radiating around both sides and into her upper abdomen but the pain was mostly associated with movement  Review of systems:     No chest pain, no SOB, no fevers, no urinary sx   Past Medical History:  Diagnosis Date  . AAA (abdominal aortic aneurysm) ([place]) 5/09   3.7x3.3 by u/s 2009  . Abnormal EKG    deep TW  inversions chronic  . Anemia   . CAD (coronary artery disease)    a. CABG 03/2011 LIMA to LAD, SVG to OM, SVG to PDA. b. cath 06/25/2015 DES to SVG to rPDA, patent LIMA to LAD, patent SVG to OM  . carotid stenosis 5/09   S/p L CEA ;  60-79% bilat ICA by preCABG dopplers 7/12  . CHF (congestive heart failure) ([place])   . Chronic back pain    "all over" (06/24/2015)  . Chronic bronchitis ([place])    "get it pretty much q yr" (06/24/2015)  . CKD (chronic kidney disease)   . Complication of anesthesia 1966   "problem w/ether"  . COPD (chronic obstructive pulmonary disease) ([place])   . Coronary artery disease 5/09   s/p CABG 7/12   L-LAD, S-OM, S-PDA  . Depression   . GERD (gastroesophageal reflux disease)    With hiatal hernia  . GIB (gastrointestinal bleeding) 9/11   S/p EGD with cautery at [facility]  . Heart murmur   . History of blood transfusion "a few times"   "all related to anemia" (06/24/2015)  . History of hiatal hernia   . Hyperlipidemia   . Hypertension   . Mitral regurgitation    3+ MR by intraoperative TEE;  s/p MV repair with Dr. Roxy Manns 7/12  . Myocardial infarction [facility]) 2012 "several"  . On home oxygen therapy    "2 liters at night; negative for sleep apnea"  . PAD (peripheral artery disease) ([facility])    Severe; s/p bilateral renal artery stents, moderate in-stent restenosis  . Paroxysmal atrial fibrillation ([facility])    coumadin;  echo 9/07: EF 60%, mild LVH;  s/p Cox Maze 7/12 with LAA clipping  . Subclavian artery stenosis, left ([facility])    stented by Dr. Trula Slade on 10/18 to help flow of her LIMA to LAD  . Type II diabetes mellitus ([place])     Patient's surgical history, family medical history, social history, medications and allergies were all reviewed in Epic   Serum creatinine: 2.36 mg/dL (H) 04/20/18 0450 Estimated creatinine clearance: 21.4 mL/min (A)  Current Outpatient Medications  Medication Sig Dispense Refill  . albuterol (PROVENTIL HFA;VENTOLIN HFA) 108 (90 Base) MCG/ACT  inhaler Inhale 2 puffs into the lungs every 4 (four) hours as needed for wheezing or shortness of breath. ProAir 1 each 0  . allopurinol (ZYLOPRIM) 100 MG tablet Take 100 mg by mouth daily.  2  . ALPRAZolam (XANAX) 0.5 MG tablet Take 0.5 mg by mouth at bedtime.     Marland Kitchen amLODipine (NORVASC) 10 MG tablet Take 10 mg by mouth daily.      . Artificial Tear Solution (SOOTHE XP OP) Apply 1-2 drops to eye 3 (three) times daily as needed (irration).    Marland Kitchen atorvastatin (LIPITOR) 40 MG tablet Take 40 mg by mouth every evening.     . carvedilol (COREG) 25 MG tablet TAKE 1 TABLET BY MOUTH 2 TIMES A DAY WITH A MEAL (Patient taking differently: TAKE 25 MG BY MOUTH 2 TIMES A DAY WITH A MEAL) 60 tablet 9  .  Cholecalciferol (VITAMIN D3) 5000 units TABS Take 5,000 Units by mouth daily.    . cloNIDine (CATAPRES) 0.2 MG tablet Take 1 tablet (0.2 mg total) by mouth 3 (three) times daily. 90 tablet 11  . clopidogrel (PLAVIX) 75 MG tablet Take 1 tablet (75 mg total) by mouth daily with breakfast. 90 tablet 3  . docusate sodium (COLACE) 100 MG capsule Take 200 mg by mouth as needed for mild constipation. With pain medication    . DULoxetine (CYMBALTA) 60 MG capsule Take one capsule (60 mg dose) by mouth daily.  1  . famotidine (PEPCID) 20 MG tablet TAKE 1 TABLET (20 MG TOTAL) BY MOUTH DAILY BEFORE SUPPER. 30 tablet 2  . fenofibrate 160 MG tablet Take 1 tablet (160 mg total) by mouth daily. 30 tablet 1  . ferrous sulfate 325 (65 FE) MG tablet Take 325 mg by mouth 2 (two) times daily with a meal.    . furosemide (LASIX) 40 MG tablet Take 40 mg by mouth daily. May take an additional tablet day as needed for weight gain   6  . gabapentin (NEURONTIN) 300 MG capsule Take 1 capsule (300 mg total) by mouth 2 (two) times daily. 60 capsule 0  . hydrALAZINE (APRESOLINE) 100 MG tablet Take 100 mg by mouth 3 (three) times daily.    Marland Kitchen HYDROcodone-acetaminophen (NORCO/VICODIN) 5-325 MG tablet Take 1 tablet by mouth every 6 (six) hours as  needed for moderate pain. 20 tablet 0  . isosorbide mononitrate (IMDUR) 30 MG 24 hr tablet Take 0.5 tablets (15 mg total) by mouth daily. 45 tablet 3  . mirtazapine (REMERON) 7.5 MG tablet Take 7.5 mg by mouth at bedtime.     . pantoprazole (PROTONIX) 40 MG tablet Take 1 tablet (40 mg total) by mouth daily. 30 tablet 11  . Potassium 99 MG TABS Take 99 mg by mouth daily.     . promethazine (PHENERGAN) 12.5 MG tablet Take 1 tablet (12.5 mg total) by mouth every 6 (six) hours as needed for nausea or vomiting. 15 tablet 0   No current facility-administered medications for this visit.     Physical Exam:     BP (!) 138/40   Pulse 65   Ht 5\' 5"  (1.651 m)   Wt 133 lb (60.3 kg)   BMI 22.13 kg/m   GENERAL:  Pleasant female in NAD PSYCH: : Cooperative, normal affect EENT:  conjunctiva pink, mucous membranes moist, neck supple without masses CARDIAC:  RRR, loud murmur heard, no peripheral edema PULM: Normal respiratory effort, lungs CTA bilaterally, no wheezing ABDOMEN:  Nondistended, soft, nontender. No obvious masses, no hepatomegaly,  normal bowel sounds SKIN:  turgor, no lesions seen Musculoskeletal:  Normal muscle tone, normal strength NEURO: Alert and oriented x 3, no focal neurologic deficits   Tye Savoy , NP 04/28/2018, 3:41 PM

## 2018-04-28 NOTE — Patient Instructions (Signed)
If you are age 65 or older, your body mass index should be between 23-30. Your Body mass index is 22.13 kg/m. If this is out of the aforementioned range listed, please consider follow up with your Primary Care Provider.  If you are age 57 or younger, your body mass index should be between 19-25. Your Body mass index is 22.13 kg/m. If this is out of the aformentioned range listed, please consider follow up with your Primary Care Provider.   Your provider has requested that you go to the basement level for lab work [place] "B" on the elevator. The lab is located at the first door on the left as you exit the elevator.  We have requested records from Dr. Dorrene German.  Thank you for choosing me and [facility].   Tye Savoy, NP

## 2018-04-29 NOTE — Progress Notes (Signed)
Thanks for seeing this patient I think we should attempt another colonoscopy in the next 3 to 4 weeks off plavix x 5 days Hopefully will be successful

## 2018-05-07 DIAGNOSIS — K859 Acute pancreatitis without necrosis or infection, unspecified: Secondary | ICD-10-CM

## 2018-05-07 HISTORY — DX: Acute pancreatitis without necrosis or infection, unspecified: K85.90

## 2018-05-10 ENCOUNTER — Telehealth: Payer: Self-pay

## 2018-05-10 ENCOUNTER — Other Ambulatory Visit: Payer: Self-pay

## 2018-05-10 DIAGNOSIS — D649 Anemia, unspecified: Secondary | ICD-10-CM

## 2018-05-10 NOTE — Telephone Encounter (Signed)
[facility] Pre-operative Risk Assessment     Request for surgical clearance:     Endoscopy Procedure  What type of surgery is being performed?     Colonoscopy  When is this surgery scheduled?     07/03/2018  What type of clearance is required ?   Pharmacy  Are there any medications that need to be held prior to surgery and how long? Plavix for 5 days  Practice name and name of physician performing surgery?      [facility]  What is your office phone and fax number?      Phone- [phone]  [phone]  Anesthesia type (None, local, MAC, general) ?       MAC

## 2018-05-10 NOTE — Telephone Encounter (Signed)
Spoke with the patient. She is in agreement with this plan. However she is on oxygen at home which will make her ineligible for an [facility] procedure. Dr [name] has a hospital block in October on the 28th. The patient is now scheduled for 07/03/18. Is this acceptable?

## 2018-05-10 NOTE — Telephone Encounter (Signed)
Patient contacted and scheduled. Pre-visit 06/20/18 at 4:30 pm. She will bring her daughter with her. Colonoscopy 07/03/18 at [facility]. Phone note to Cardiology for permission to hold anticoagulation (Plavix) for 5 days prior.

## 2018-05-10 NOTE — Telephone Encounter (Signed)
Yes, this is fine. Thank you.

## 2018-05-10 NOTE — Telephone Encounter (Signed)
-----   Message from Willia Craze, NP sent at 05/09/2018  8:57 AM EDT ----- Good morning Beth,  Please let patient know sometime in the next few days that Dr. [name] agrees that we need to reattempt a colonoscopy on her given recent admission for anemia.  I know she is still recovering from gallbladder surgery so this can be scheduled out for 6 weeks or so. Thanks ----- Message ----- From: [name], MD Sent: 04/29/2018  12:19 PM EDT To: Willia Craze, NP    ----- Message ----- From: Willia Craze, NP Sent: 04/28/2018   6:20 PM EDT To: [name], MD

## 2018-05-11 NOTE — Telephone Encounter (Signed)
   Primary [place], MD  (last seen 06/2017)  Chart reviewed as part of pre-operative protocol coverage.   -  Patient has appointment with Dr. Burt Knack 07/03/18. On day of scheduled colonoscopy. Asking for holding plavix for 5 days.    Dr. Burt Knack, is it okay hold Plavix? It would be best if colonoscopy is schedule before or after appointment with Dr. Burt Knack.   [place], [place]  05/11/2018, 3:46 PM

## 2018-05-11 NOTE — Telephone Encounter (Signed)
Thanks for working hard on this patient for me Merrie Roof

## 2018-05-14 NOTE — Telephone Encounter (Signed)
Chart reviewed. Last PCI was in 2016. Ok to hold plavix for colonoscopy as planned.

## 2018-05-15 ENCOUNTER — Other Ambulatory Visit: Payer: Self-pay

## 2018-05-15 ENCOUNTER — Ambulatory Visit (INDEPENDENT_AMBULATORY_CARE_PROVIDER_SITE_OTHER): Payer: Medicare Other | Admitting: Surgery

## 2018-05-15 ENCOUNTER — Encounter (HOSPITAL_COMMUNITY): Payer: Self-pay | Admitting: Emergency Medicine

## 2018-05-15 ENCOUNTER — Ambulatory Visit (INDEPENDENT_AMBULATORY_CARE_PROVIDER_SITE_OTHER)
Admission: RE | Admit: 2018-05-15 | Discharge: 2018-05-15 | Disposition: A | Discharge status: Home / Self Care | Payer: Medicare Other | Source: Ambulatory Visit | Attending: Surgery | Admitting: Surgery

## 2018-05-15 ENCOUNTER — Encounter: Payer: Self-pay | Admitting: Surgery

## 2018-05-15 ENCOUNTER — Emergency Department (HOSPITAL_COMMUNITY)
Admission: EM | Admit: 2018-05-15 | Discharge: 2018-05-16 | Disposition: A | Discharge status: Home / Self Care | Payer: Medicare Other | Attending: Emergency Medicine | Admitting: Emergency Medicine

## 2018-05-15 ENCOUNTER — Other Ambulatory Visit: Payer: Self-pay | Admitting: Student

## 2018-05-15 VITALS — BP 173/78 | HR 92 | Temp 97.4°F | Resp 14 | Ht 65.0 in | Wt 123.0 lb

## 2018-05-15 DIAGNOSIS — I2510 Atherosclerotic heart disease of native coronary artery without angina pectoris: Secondary | ICD-10-CM | POA: Insufficient documentation

## 2018-05-15 DIAGNOSIS — I714 Abdominal aortic aneurysm, without rupture, unspecified: Secondary | ICD-10-CM

## 2018-05-15 DIAGNOSIS — N189 Chronic kidney disease, unspecified: Secondary | ICD-10-CM | POA: Diagnosis not present

## 2018-05-15 DIAGNOSIS — E119 Type 2 diabetes mellitus without complications: Secondary | ICD-10-CM | POA: Diagnosis not present

## 2018-05-15 DIAGNOSIS — N186 End stage renal disease: Secondary | ICD-10-CM

## 2018-05-15 DIAGNOSIS — Z992 Dependence on renal dialysis: Secondary | ICD-10-CM | POA: Diagnosis not present

## 2018-05-15 DIAGNOSIS — F17210 Nicotine dependence, cigarettes, uncomplicated: Secondary | ICD-10-CM | POA: Diagnosis not present

## 2018-05-15 DIAGNOSIS — I509 Heart failure, unspecified: Secondary | ICD-10-CM | POA: Diagnosis not present

## 2018-05-15 DIAGNOSIS — R1084 Generalized abdominal pain: Secondary | ICD-10-CM

## 2018-05-15 DIAGNOSIS — Z79899 Other long term (current) drug therapy: Secondary | ICD-10-CM | POA: Insufficient documentation

## 2018-05-15 DIAGNOSIS — Z48812 Encounter for surgical aftercare following surgery on the circulatory system: Secondary | ICD-10-CM

## 2018-05-15 DIAGNOSIS — J449 Chronic obstructive pulmonary disease, unspecified: Secondary | ICD-10-CM | POA: Insufficient documentation

## 2018-05-15 DIAGNOSIS — I130 Hypertensive heart and chronic kidney disease with heart failure and stage 1 through stage 4 chronic kidney disease, or unspecified chronic kidney disease: Secondary | ICD-10-CM | POA: Diagnosis not present

## 2018-05-15 DIAGNOSIS — K858 Other acute pancreatitis without necrosis or infection: Secondary | ICD-10-CM

## 2018-05-15 DIAGNOSIS — R109 Unspecified abdominal pain: Secondary | ICD-10-CM | POA: Diagnosis present

## 2018-05-15 DIAGNOSIS — N184 Chronic kidney disease, stage 4 (severe): Secondary | ICD-10-CM

## 2018-05-15 DIAGNOSIS — I6523 Occlusion and stenosis of bilateral carotid arteries: Secondary | ICD-10-CM | POA: Diagnosis not present

## 2018-05-15 DIAGNOSIS — K859 Acute pancreatitis without necrosis or infection, unspecified: Secondary | ICD-10-CM | POA: Diagnosis not present

## 2018-05-15 LAB — CBC
HCT: 29.4 % — ABNORMAL LOW (ref 36.0–46.0)
Hemoglobin: 9.1 g/dL — ABNORMAL LOW (ref 12.0–15.0)
MCH: 31.1 pg (ref 26.0–34.0)
MCHC: 31 g/dL (ref 30.0–36.0)
MCV: 100.3 fL — AB (ref 78.0–100.0)
PLATELETS: 284 10*3/uL (ref 150–400)
RBC: 2.93 MIL/uL — ABNORMAL LOW (ref 3.87–5.11)
RDW: 17.5 % — AB (ref 11.5–15.5)
WBC: 11.2 10*3/uL — ABNORMAL HIGH (ref 4.0–10.5)

## 2018-05-15 LAB — COMPREHENSIVE METABOLIC PANEL
ALT: 8 U/L (ref 0–44)
AST: 20 U/L (ref 15–41)
Albumin: 2.3 g/dL — ABNORMAL LOW (ref 3.5–5.0)
Alkaline Phosphatase: 50 U/L (ref 38–126)
Anion gap: 13 (ref 5–15)
BILIRUBIN TOTAL: 1.2 mg/dL (ref 0.3–1.2)
BUN: 20 mg/dL (ref 8–23)
CHLORIDE: 100 mmol/L (ref 98–111)
CO2: 27 mmol/L (ref 22–32)
CREATININE: 1.92 mg/dL — AB (ref 0.44–1.00)
Calcium: 8.1 mg/dL — ABNORMAL LOW (ref 8.9–10.3)
GFR calc Af Amer: 30 mL/min — ABNORMAL LOW (ref >60)
GFR, EST NON AFRICAN AMERICAN: 26 mL/min — AB (ref >60)
Glucose, Bld: 183 mg/dL — ABNORMAL HIGH (ref 70–99)
Potassium: 3.1 mmol/L — ABNORMAL LOW (ref 3.5–5.1)
Sodium: 140 mmol/L (ref 135–145)
Total Protein: 5 g/dL — ABNORMAL LOW (ref 6.5–8.1)

## 2018-05-15 LAB — I-STAT CG4 LACTIC ACID, ED: Lactic Acid, Venous: 1.86 mmol/L (ref 0.5–1.9)

## 2018-05-15 LAB — LIPASE, BLOOD: Lipase: 79 U/L — ABNORMAL HIGH (ref 11–51)

## 2018-05-15 NOTE — Progress Notes (Signed)
Linden Dolin Howden Documented: 05/15/2018 4:20 PM Location: [facility] Patient #: [id number] DOB: Mar 25, 1951 Widowed / Language: Cleophus Molt / Race: White Female  History of Present Illness  The patient is a 67 year old female presenting status-post cholecystectomy. PMHx of renal insufficiency, AAA, heart failure, and other medical issues, who presented to the ER on 04/09/18 with weakness. An upper endoscopy was obtained on 04/12/18 to rule out bleed which was negative. CT scan was obtained to rule out retroperitoneal bleed, but it showed pancreatitis along with cholecystitis. Laparoscopic cholecystectomy for gallstone pancreatitis was performed on 04/19/18 by Dr. Donne Hazel. Discharged post-op day 2.   She followed up with GI and their plan is to do a repeat colonoscopy in the future. Her last colonoscopy was virtual due to difficulty evaluating the colon even with a pediatric scope. She also followed up with her PCP who redrew blood work and she saw me 2 weeks ago for routine follow-up. At that time, she was having a constant RLQ pain radiating into her back but was relieved with pain medicine.  She saw Dr. Trula Slade, vascular surgeon, earlier today, 05/15/18, who spoke to Dr. Donne Hazel regarding the patient's right upper quadrant pain, nausea, and anorexia  3 days. She states she has not eaten in 3 days and her pain is slightly better than 2 days ago but is still present. She complains of 1-2 loose bowel movements a day. Denies issues with urination. Denies fevers, but admits to night sweats. She states she vomited once yesterday when coughing so it was mostly phlegm. She also fell 1.5 weeks ago and has some bruising on her neck and arm. She takes Plavix.   Allergies Codeine/Codeine Derivatives Allergies Reconciled  Medication History ALPRAZolam (0.5MG  Tablet, Oral) Active. AmLODIPine Besylate (10MG  Tablet, Oral) Active. Gabapentin (300MG  Capsule, Oral) Active. Promethazine HCl  (12.5MG  Tablet, Oral) Active. PredniSONE (20MG  Tablet, Oral) Active. HydrALAZINE HCl (100MG  Tablet, Oral) Active. Pantoprazole Sodium (40MG  Tablet DR, Oral) Active. Hydrocodone-Acetaminophen (5-325MG  Tablet, Oral) Active. Medications Reconciled   Review of Systems General Present- Fatigue. Not Present- Appetite Loss, Chills, Fever, Night Sweats, Weight Gain and Weight Loss. Skin Not Present- Change in Wart/Mole, Dryness, Hives, Jaundice, New Lesions, Non-Healing Wounds, Rash and Ulcer. HEENT Not Present- Earache, Hearing Loss, Hoarseness, Nose Bleed, Oral Ulcers, Ringing in the Ears, Seasonal Allergies, Sinus Pain, Sore Throat, Visual Disturbances, Wears glasses/contact lenses and Yellow Eyes. Respiratory Present- Snoring and Wheezing. Not Present- Bloody sputum, Chronic Cough and Difficulty Breathing. Breast Not Present- Breast Mass, Breast Pain, Nipple Discharge and Skin Changes. Cardiovascular Present- Difficulty Breathing Lying Down, Palpitations and Swelling of Extremities. Not Present- Chest Pain, Leg Cramps, Rapid Heart Rate and Shortness of Breath. Gastrointestinal Present- Abdominal Pain. Not Present- Bloating, Bloody Stool, Change in Bowel Habits, Chronic diarrhea, Constipation, Difficulty Swallowing, Excessive gas, Gets full quickly at meals, Hemorrhoids, Indigestion, Nausea, Rectal Pain and Vomiting. Female Genitourinary Not Present- Frequency, Nocturia, Painful Urination, Pelvic Pain and Urgency. Musculoskeletal Present- Back Pain, Joint Pain, Joint Stiffness, Muscle Pain, Muscle Weakness and Swelling of Extremities. Neurological Present- Trouble walking and Weakness. Not Present- Decreased Memory, Fainting, Headaches, Numbness, Seizures, Tingling and Tremor. Psychiatric Not Present- Anxiety, Bipolar, Change in Sleep Pattern, Depression, Fearful and Frequent crying. Endocrine Present- Cold Intolerance. Not Present- Excessive Hunger, Hair Changes, Heat Intolerance, Hot flashes  and New Diabetes. Hematology Present- Blood Thinners and Easy Bruising. Not Present- Excessive bleeding, Gland problems, HIV and Persistent Infections.  Vitals 05/15/2018 4:20 PM Weight: 126 lb Height: 60in Body Surface Area: 1.53 m  Body Mass Index: 24.61 kg/m  Temp.: 98.24F(Oral)  Pulse: 73 (Regular)  BP: 132/80 (Sitting, Left Arm, Standard)   Physical Exam  General Alert, oriented, in no acute distress Slightly deconditioned Uses walker Face is pale, no jaundice  Eye Anicteric  Chest and Lung Exam Effort normal  Abdomen Soft, non-tender, non-distended. Incisions dry and intact. No surrounding erythema or drainage.   Assessment & Plan  S/P LAPAROSCOPIC CHOLECYSTECTOMY (Z90.49) Impression: She is almost 4 weeks status post laparoscopic cholecystectomy for gallstone pancreatitis. She is having a slow recovery and is still complaining of right upper quadrant pain today. On exam, she does not appear toxic and VSS, but she does seem weak and pale. Her abdominal exam is fairly benign and she shows no signs of jaundice so it is difficult to determine if there is a postop complication present. Therefore, Dr. Dema Severin also evaluated the patient and agrees that she would benefit from a workup in the emergency room to evaluate other etiologies to explain her pain and anorexia. Dr. Donne Hazel is aware and is on call this evening.  Signed electronically by Kellie Shropshire, PA C (05/15/2018 5:52 PM)

## 2018-05-15 NOTE — Telephone Encounter (Signed)
   Primary Cardiologist: Sherren Mocha, MD  Chart reviewed by Dr Burt Knack as part of pre-operative protocol coverage. Given past medical history and time since last visit, based on ACC/AHA guidelines, [name] would be at acceptable risk for the planned procedure without further cardiovascular testing.   OK to hold Plavix 5 days pre op if needed.  I will route this recommendation to the requesting party via Epic fax function and remove from pre-op pool.  Please call with questions.  Kerin Ransom, PA-C 05/15/2018, 3:02 PM

## 2018-05-15 NOTE — Progress Notes (Signed)
[facility] [place]  Patient name: [name] MRN: [id number] DOB: 09-14-1950 Sex: female   REASON FOR VISIT:    Follow up  HISOTRY OF PRESENT ILLNESS:    KEELEE [name] is a 67 y.o. female who I have been following for both chronic renal insufficiency as well as an abdominal aortic aneurysm.  We were trying to delay the treatment of her aneurysm until her renal disease progressed to dialysis.  She presented to the hospital in May 2019 with worsening abdominal pain and an increase in the size of her aneurysm.  Her aneurysm had increased from 4.8 cm up to 5.4 cm.  On 01/17/2018 she underwent endovascular repair.  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  In order to cross this lesion I had to snare it from the contralateral right side.  A VBX stent was placed across the lesion inside the aortic graft due to residual narrowing.  Patient's postoperative course was complicated by retroperitoneal hematoma likely the result from arterial injury when dilating her left common iliac artery.  A total of 7 cc of contrast were utilized for her repair.  The patient stayed in the hospital almost an additional week.  She has been wearing nitroglycerin patches to her left foot because of some discoloration not to be embolization from her surgery.  She still gets some pins and needle pain.  The discoloration is less.  She underwent a fistulogram on 03-21-2018 and had PTA of the proximal fistula.  She is back for follow up.  She is not on dialysis.  She has been complaining of right upper quadrant pain, anorexia, and nausea for the past 2 days.  She did undergo a laparoscopic cholecystectomy approximately 3 weeks ago for gallstone pancreatitis.   PAST MEDICAL HISTORY:   Past Medical History:  Diagnosis Date  . AAA (abdominal aortic aneurysm) ([address]) 5/09   3.7x3.3 by u/s 2009  . Abnormal EKG    deep TW inversions chronic  .  Anemia   . CAD (coronary artery disease)    a. CABG 03/2011 LIMA to LAD, SVG to OM, SVG to PDA. b. cath 06/25/2015 DES to SVG to rPDA, patent LIMA to LAD, patent SVG to OM  . carotid stenosis 5/09   S/p L CEA ;  60-79% bilat ICA by preCABG dopplers 7/12  . CHF (congestive heart failure) ([place])   . Chronic back pain    "all over" (06/24/2015)  . Chronic bronchitis ([place])    "get it pretty much q yr" (06/24/2015)  . CKD (chronic kidney disease)   . Complication of anesthesia 1966   "problem w/ether"  . COPD (chronic obstructive pulmonary disease) ([place])   . Coronary artery disease 5/09   s/p CABG 7/12   L-LAD, S-OM, S-PDA  . Depression   . GERD (gastroesophageal reflux disease)    With hiatal hernia  . GIB (gastrointestinal bleeding) 9/11   S/p EGD with cautery at [facility]  . Heart murmur   . History of blood transfusion "a few times"   "all related to anemia" (06/24/2015)  . History of hiatal hernia   . Hyperlipidemia   . Hypertension   . Mitral regurgitation    3+ MR by intraoperative TEE;  s/p MV repair with Dr. Roxy Manns 7/12  . Myocardial infarction [facility]) 2012 "several"  . On home oxygen therapy    "2 liters at night; negative for sleep apnea"  . PAD (peripheral artery disease) (  [place])    Severe; s/p bilateral renal artery stents, moderate in-stent restenosis  . Paroxysmal atrial fibrillation (HCC)    coumadin;  echo 9/07: EF 60%, mild LVH;  s/p Cox Maze 7/12 with LAA clipping  . Subclavian artery stenosis, left (HCC)    stented by Dr. Trula Slade on 10/18 to help flow of her LIMA to LAD  . Type II diabetes mellitus ([place])      FAMILY HISTORY:   Family History  Problem Relation Age of Onset  . Emphysema Mother   . Heart disease Mother        before age 24  . Hypertension Mother   . Hyperlipidemia Mother   . Heart attack Mother   . AAA (abdominal aortic aneurysm) Mother        rupture  . Heart attack Father 39  . Emphysema Father   . Heart disease Father        before age 22  .  Hyperlipidemia Father   . Hypertension Father   . Peripheral vascular disease Father   . Diabetes Brother   . Heart disease Brother        before age 44  . Hyperlipidemia Brother   . Hypertension Brother   . Heart attack Brother        CABG  . Heart disease Other        Vascular disease in grandparents, uncles and dad  . Colon cancer Neg Hx     SOCIAL HISTORY:   Social History   Tobacco Use  . Smoking status: Current Every Day Smoker    Years: 50.00    Types: Cigarettes  . Smokeless tobacco: Never Used  . Tobacco comment: 3/4 pk per day  Substance Use Topics  . Alcohol use: Yes    Alcohol/week: 0.0 standard drinks    Comment: occasionally     ALLERGIES:   Allergies  Allergen Reactions  . Isosorbide Other (See Comments)    UNSPECIFIED REACTION - okay in low doses  . Isosorbide Nitrate Other (See Comments)    unknown unknown  . Oxycodone-Acetaminophen Other (See Comments)    unknown unknown  . Codeine Itching    unknown  . Contrast Media [Iodinated Diagnostic Agents] Itching and Other (See Comments)    Itching of feet  . Ioxaglate Itching and Other (See Comments)    Itching of feet  . Metrizamide Itching and Other (See Comments)    Itching of feet  . Percocet [Oxycodone-Acetaminophen] Itching and Other (See Comments)    Tolerates Hydrocodone     CURRENT MEDICATIONS:   Current Outpatient Medications  Medication Sig Dispense Refill  . albuterol (PROVENTIL HFA;VENTOLIN HFA) 108 (90 Base) MCG/ACT inhaler Inhale 2 puffs into the lungs every 4 (four) hours as needed for wheezing or shortness of breath. ProAir 1 each 0  . allopurinol (ZYLOPRIM) 100 MG tablet Take 100 mg by mouth daily.  2  . ALPRAZolam (XANAX) 0.5 MG tablet Take 0.5 mg by mouth at bedtime.     Marland Kitchen amLODipine (NORVASC) 10 MG tablet Take 10 mg by mouth daily.      . Artificial Tear Solution (SOOTHE XP OP) Apply 1-2 drops to eye 3 (three) times daily as needed (irration).    Marland Kitchen atorvastatin  (LIPITOR) 40 MG tablet Take 40 mg by mouth every evening.     . carvedilol (COREG) 25 MG tablet TAKE 1 TABLET BY MOUTH 2 TIMES A DAY WITH A MEAL (Patient taking differently: TAKE 25 MG BY MOUTH 2  TIMES A DAY WITH A MEAL) 60 tablet 9  . Cholecalciferol (VITAMIN D3) 5000 units TABS Take 5,000 Units by mouth daily.    . cloNIDine (CATAPRES) 0.2 MG tablet Take 1 tablet (0.2 mg total) by mouth 3 (three) times daily. 90 tablet 11  . clopidogrel (PLAVIX) 75 MG tablet Take 1 tablet (75 mg total) by mouth daily with breakfast. 90 tablet 3  . docusate sodium (COLACE) 100 MG capsule Take 200 mg by mouth as needed for mild constipation. With pain medication    . DULoxetine (CYMBALTA) 60 MG capsule Take one capsule (60 mg dose) by mouth daily.  1  . famotidine (PEPCID) 20 MG tablet TAKE 1 TABLET (20 MG TOTAL) BY MOUTH DAILY BEFORE SUPPER. 30 tablet 2  . fenofibrate 160 MG tablet Take 1 tablet (160 mg total) by mouth daily. 30 tablet 1  . ferrous sulfate 325 (65 FE) MG tablet Take 325 mg by mouth 2 (two) times daily with a meal.    . furosemide (LASIX) 40 MG tablet Take 40 mg by mouth daily. May take an additional tablet day as needed for weight gain   6  . gabapentin (NEURONTIN) 300 MG capsule TAKE 1 CAPSULE(300 MG) BY MOUTH TWICE DAILY 60 capsule 0  . hydrALAZINE (APRESOLINE) 100 MG tablet Take 100 mg by mouth 3 (three) times daily.    Marland Kitchen HYDROcodone-acetaminophen (NORCO/VICODIN) 5-325 MG tablet Take 1 tablet by mouth every 6 (six) hours as needed for moderate pain. 20 tablet 0  . isosorbide mononitrate (IMDUR) 30 MG 24 hr tablet Take 0.5 tablets (15 mg total) by mouth daily. 45 tablet 3  . mirtazapine (REMERON) 7.5 MG tablet Take 7.5 mg by mouth at bedtime.     . pantoprazole (PROTONIX) 40 MG tablet Take 1 tablet (40 mg total) by mouth daily. 30 tablet 11  . Potassium 99 MG TABS Take 99 mg by mouth daily.     . promethazine (PHENERGAN) 12.5 MG tablet Take 1 tablet (12.5 mg total) by mouth every 6 (six) hours  as needed for nausea or vomiting. 15 tablet 0   No current facility-administered medications for this visit.     REVIEW OF SYSTEMS:   [X]  denotes positive finding, [ ]  denotes negative finding Cardiac  Comments:  Chest pain or chest pressure:    Shortness of breath upon exertion:    Short of breath when lying flat:    Irregular heart rhythm:        Vascular    Pain in calf, thigh, or hip brought on by ambulation:    Pain in feet at night that wakes you up from your sleep:     Blood clot in your veins:    Leg swelling:         Pulmonary    Oxygen at home:    Productive cough:     Wheezing:         Neurologic    Sudden weakness in arms or legs:     Sudden numbness in arms or legs:     Sudden onset of difficulty speaking or slurred speech:    Temporary loss of vision in one eye:     Problems with dizziness:         Gastrointestinal    Blood in stool:     Vomited blood:         Genitourinary    Burning when urinating:     Blood in urine:  Psychiatric    Major depression:         Hematologic    Bleeding problems:    Problems with blood clotting too easily:        Skin    Rashes or ulcers:        Constitutional    Fever or chills:      PHYSICAL EXAM:   Vitals:   05/15/18 0844  BP: (!) 173/78  Pulse: 92  Resp: 14  Temp: (!) 97.4 F (36.3 C)  TempSrc: Oral  SpO2: 92%  Weight: 123 lb (55.8 kg)  Height: 5\' 5"  (1.651 m)    GENERAL: The patient is a well-nourished female, in no acute distress. The vital signs are documented above. CARDIAC: There is a regular rate and rhythm.  VASCULAR: Palpable thrill within left arm fistula PULMONARY: Non-labored respirations ABDOMEN: Right upper quadrant tenderness.  No peritoneal signs.  No rebound. MUSCULOSKELETAL: There are no major deformities or cyanosis. NEUROLOGIC: No focal weakness or paresthesias are detected. SKIN: There are no ulcers or rashes noted.  No jaundice PSYCHIATRIC: The patient has a normal  affect.  STUDIES:   I have ordered and reviewed her following duplex studies;  Carotid:  Bilateral 40-59 % stenosis  Fistula:  anastamotic stenosis  MEDICAL ISSUES:   AAA:  F/u u/s in 6 months  Renal: Ultrasound shows residual stenosis near the arterial venous anastomosis, however there is a good thrill within the fistula.  At this point, I would recommend using her fistula if needed.  If there are difficulties, I discussed either repeating angioplasty of the proximal fistula or considering replacing the proximal portion of the fistula  Carotid:  Follow up duplex 1 year  GI: The patient is status post laparoscopic cholecystectomy for gallstone pancreatitis.  She has a few day history of right upper quadrant pain, nausea, and anorexia.  I did talk to Dr. Donne Hazel.  He is going to initiate blood work and possible imaging and will contact the patient later today.  [name], MD [facility] Specialists of [facility] [phone] Pager [phone]

## 2018-05-15 NOTE — ED Triage Notes (Signed)
Pt is status post gallbladder surgery in august. For the past 3 days had has increasing abdominal pain on the right side. No N/V/D.  No appetite.

## 2018-05-16 ENCOUNTER — Emergency Department (HOSPITAL_COMMUNITY): Payer: Medicare Other

## 2018-05-16 LAB — URINALYSIS, ROUTINE W REFLEX MICROSCOPIC
BACTERIA UA: NONE SEEN
Bilirubin Urine: NEGATIVE
Glucose, UA: NEGATIVE mg/dL
Hgb urine dipstick: NEGATIVE
Ketones, ur: NEGATIVE mg/dL
Leukocytes, UA: NEGATIVE
Nitrite: NEGATIVE
PROTEIN: 100 mg/dL — AB
SPECIFIC GRAVITY, URINE: 1.019 (ref 1.005–1.030)
pH: 5 (ref 5.0–8.0)

## 2018-05-16 MED ORDER — MORPHINE SULFATE (PF) 4 MG/ML IV SOLN
4.0000 mg | Freq: Once | INTRAVENOUS | Status: AC
  Administered 2018-05-16: 4 mg via INTRAVENOUS
  Filled 2018-05-16: qty 1

## 2018-05-16 MED ORDER — ONDANSETRON HCL 4 MG/2ML IJ SOLN
4.0000 mg | Freq: Once | INTRAMUSCULAR | Status: AC
  Administered 2018-05-16: 4 mg via INTRAVENOUS
  Filled 2018-05-16: qty 2

## 2018-05-16 MED ORDER — PROMETHAZINE HCL 25 MG PO TABS: 25.0000 mg | ORAL_TABLET | Freq: Four times a day (QID) | ORAL | 0 refills | Status: DC | PRN

## 2018-05-16 MED ORDER — SODIUM CHLORIDE 0.9 % IV SOLN
INTRAVENOUS | Status: DC
  Administered 2018-05-16: 07:00:00 via INTRAVENOUS

## 2018-05-16 NOTE — ED Provider Notes (Signed)
TIME SEEN: 4:39 AM  CHIEF COMPLAINT: Abdominal pain  HPI: Patient is a 67 year old female with history of CAD status post CABG, CHF, COPD, chronic kidney disease who presents to the emergency department with complaints of abdominal pain.  Patient had a cholecystectomy on April 19, 2018.  Has had history of gallstone pancreatitis.  States this feels similar she is having pain in her upper abdomen but also pain in her right lower quadrant.  She is status post AAA repair in May 2019 with subsequent retroperitoneal hematoma, appendectomy, hysterectomy.  She has had diarrhea without bloody stool or melena.  She has had nausea.  No known fevers.  States she was sent here by her surgeon's office for further evaluation.  ROS: See HPI Constitutional: no fever  Eyes: no drainage  ENT: no runny nose   Cardiovascular:  no chest pain  Resp: no SOB  GI: no vomiting GU: no dysuria Integumentary: no rash  Allergy: no hives  Musculoskeletal: no leg swelling  Neurological: no slurred speech ROS otherwise negative  PAST MEDICAL HISTORY/PAST SURGICAL HISTORY:  Past Medical History:  Diagnosis Date  . AAA (abdominal aortic aneurysm) ([place]) 5/09   3.7x3.3 by u/s 2009  . Abnormal EKG    deep TW inversions chronic  . Anemia   . CAD (coronary artery disease)    a. CABG 03/2011 LIMA to LAD, SVG to OM, SVG to PDA. b. cath 06/25/2015 DES to SVG to rPDA, patent LIMA to LAD, patent SVG to OM  . carotid stenosis 5/09   S/p L CEA ;  60-79% bilat ICA by preCABG dopplers 7/12  . CHF (congestive heart failure) ([place])   . Chronic back pain    "all over" (06/24/2015)  . Chronic bronchitis ([place])    "get it pretty much q yr" (06/24/2015)  . CKD (chronic kidney disease)   . Complication of anesthesia 1966   "problem w/ether"  . COPD (chronic obstructive pulmonary disease) ([place])   . Coronary artery disease 5/09   s/p CABG 7/12   L-LAD, S-OM, S-PDA  . Depression   . GERD (gastroesophageal reflux disease)    With  hiatal hernia  . GIB (gastrointestinal bleeding) 9/11   S/p EGD with cautery at [facility]  . Heart murmur   . History of blood transfusion "a few times"   "all related to anemia" (06/24/2015)  . History of hiatal hernia   . Hyperlipidemia   . Hypertension   . Mitral regurgitation    3+ MR by intraoperative TEE;  s/p MV repair with Dr. Roxy Manns 7/12  . Myocardial infarction [facility]) 2012 "several"  . On home oxygen therapy    "2 liters at night; negative for sleep apnea"  . PAD (peripheral artery disease) (HCC)    Severe; s/p bilateral renal artery stents, moderate in-stent restenosis  . Paroxysmal atrial fibrillation (HCC)    coumadin;  echo 9/07: EF 60%, mild LVH;  s/p Cox Maze 7/12 with LAA clipping  . Subclavian artery stenosis, left (HCC)    stented by Dr. Trula Slade on 10/18 to help flow of her LIMA to LAD  . Type II diabetes mellitus (HCC)     MEDICATIONS:  Prior to Admission medications   Medication Sig Start Date End Date Taking? Authorizing Provider  albuterol (PROVENTIL HFA;VENTOLIN HFA) 108 (90 Base) MCG/ACT inhaler Inhale 2 puffs into the lungs every 4 (four) hours as needed for wheezing or shortness of breath. ProAir 10/08/15   Delfina Redwood, MD  allopurinol (ZYLOPRIM) 100 MG  tablet Take 100 mg by mouth daily. 02/02/17   [provider]  ALPRAZolam Duanne Moron) 0.5 MG tablet Take 0.5 mg by mouth at bedtime.     [provider]  amLODipine (NORVASC) 10 MG tablet Take 10 mg by mouth daily.      [provider]  Artificial Tear Solution (SOOTHE XP OP) Apply 1-2 drops to eye 3 (three) times daily as needed (irration).    [provider]  atorvastatin (LIPITOR) 40 MG tablet Take 40 mg by mouth every evening.  12/07/17   [provider]  carvedilol (COREG) 25 MG tablet TAKE 1 TABLET BY MOUTH 2 TIMES A DAY WITH A MEAL Patient taking differently: TAKE 25 MG BY MOUTH 2 TIMES A DAY WITH A MEAL 11/04/15   Burtis Junes, NP  Cholecalciferol (VITAMIN D3)  5000 units TABS Take 5,000 Units by mouth daily.    [provider]  cloNIDine (CATAPRES) 0.2 MG tablet Take 1 tablet (0.2 mg total) by mouth 3 (three) times daily. 07/10/15   Richardson Dopp T, PA-C  clopidogrel (PLAVIX) 75 MG tablet Take 1 tablet (75 mg total) by mouth daily with breakfast. 06/26/15   Almyra Deforest, PA  docusate sodium (COLACE) 100 MG capsule Take 200 mg by mouth as needed for mild constipation. With pain medication    [provider]  DULoxetine (CYMBALTA) 60 MG capsule Take one capsule (60 mg dose) by mouth daily. 10/26/17   [provider]  famotidine (PEPCID) 20 MG tablet TAKE 1 TABLET (20 MG TOTAL) BY MOUTH DAILY BEFORE SUPPER. 01/27/16   Sherren Mocha, MD  fenofibrate 160 MG tablet Take 1 tablet (160 mg total) by mouth daily. 10/08/15   Delfina Redwood, MD  ferrous sulfate 325 (65 FE) MG tablet Take 325 mg by mouth 2 (two) times daily with a meal.    [provider]  furosemide (LASIX) 40 MG tablet Take 40 mg by mouth daily. May take an additional tablet day as needed for weight gain  05/30/17   [provider]  gabapentin (NEURONTIN) 300 MG capsule TAKE 1 CAPSULE(300 MG) BY MOUTH TWICE DAILY 05/05/18   Serafina Mitchell, MD  hydrALAZINE (APRESOLINE) 100 MG tablet Take 100 mg by mouth 3 (three) times daily.    [provider]  HYDROcodone-acetaminophen (NORCO/VICODIN) 5-325 MG tablet Take 1 tablet by mouth every 6 (six) hours as needed for moderate pain. 04/20/18   Rolm Bookbinder, MD  isosorbide mononitrate (IMDUR) 30 MG 24 hr tablet Take 0.5 tablets (15 mg total) by mouth daily. 12/02/15   Sherren Mocha, MD  mirtazapine (REMERON) 7.5 MG tablet Take 7.5 mg by mouth at bedtime.  02/15/18 08/14/18  [provider]  pantoprazole (PROTONIX) 40 MG tablet Take 1 tablet (40 mg total) by mouth daily. 06/26/15   Almyra Deforest, PA  Potassium 99 MG TABS Take 99 mg by mouth daily.     [provider]  promethazine (PHENERGAN)  12.5 MG tablet Take 1 tablet (12.5 mg total) by mouth every 6 (six) hours as needed for nausea or vomiting. 04/20/18   Rolm Bookbinder, MD    ALLERGIES:  Allergies  Allergen Reactions  . Isosorbide Other (See Comments)    UNSPECIFIED REACTION - okay in low doses  . Isosorbide Nitrate Other (See Comments)    unknown unknown  . Oxycodone-Acetaminophen Other (See Comments)    unknown unknown  . Codeine Itching    unknown  . Contrast Media [Iodinated Diagnostic Agents] Itching and  Other (See Comments)    Itching of feet  . Ioxaglate Itching and Other (See Comments)    Itching of feet  . Metrizamide Itching and Other (See Comments)    Itching of feet  . Percocet [Oxycodone-Acetaminophen] Itching and Other (See Comments)    Tolerates Hydrocodone    SOCIAL HISTORY:  Social History   Tobacco Use  . Smoking status: Current Every Day Smoker    Years: 50.00    Types: Cigarettes  . Smokeless tobacco: Never Used  . Tobacco comment: 3/4 pk per day  Substance Use Topics  . Alcohol use: Yes    Alcohol/week: 0.0 standard drinks    Comment: occasionally    FAMILY HISTORY: Family History  Problem Relation Age of Onset  . Emphysema Mother   . Heart disease Mother        before age 55  . Hypertension Mother   . Hyperlipidemia Mother   . Heart attack Mother   . AAA (abdominal aortic aneurysm) Mother        rupture  . Heart attack Father 22  . Emphysema Father   . Heart disease Father        before age 33  . Hyperlipidemia Father   . Hypertension Father   . Peripheral vascular disease Father   . Diabetes Brother   . Heart disease Brother        before age 46  . Hyperlipidemia Brother   . Hypertension Brother   . Heart attack Brother        CABG  . Heart disease Other        Vascular disease in grandparents, uncles and dad  . Colon cancer Neg Hx     EXAM: BP (!) 169/59   Pulse 84   Temp 98 F (36.7 C) (Oral)   Resp 18   SpO2 96%  CONSTITUTIONAL: Alert and  oriented and responds appropriately to questions.  Chronically ill-appearing HEAD: Normocephalic EYES: Conjunctivae clear, pupils appear equal, EOMI ENT: normal nose; moist mucous membranes NECK: Supple, no meningismus, no nuchal rigidity, no LAD  CARD: RRR; S1 and S2 appreciated; no murmurs, no clicks, no rubs, no gallops RESP: Normal chest excursion without splinting or tachypnea; breath sounds clear and equal bilaterally; no wheezes, no rhonchi, no rales, no hypoxia or respiratory distress, speaking full sentences ABD/GI: Normal bowel sounds; non-distended; soft, diffusely tender throughout the abdomen especially the epigastric area and right lower quadrant, no rebound, no guarding, no peritoneal signs, no hepatosplenomegaly BACK:  The back appears normal and is non-tender to palpation, there is no CVA tenderness EXT: Normal ROM in all joints; non-tender to palpation; no edema; normal capillary refill; no cyanosis, no calf tenderness or swelling    SKIN: Normal color for age and race; warm; no rash NEURO: Moves all extremities equally PSYCH: The patient's mood and manner are appropriate. Grooming and personal hygiene are appropriate.  MEDICAL DECISION MAKING: Patient here with abdominal pain.  Screening labs show mild leukocytosis and mild elevation of her lipase but normal LFTs and lactate.  She could have gallstone pancreatitis again as she is tender in the upper abdomen but also tender in the right lower quadrant.  I do not think this is complications from her AAA and at this time she cannot receive IV contrast as she has a history of chronic kidney disease and has a fistula in place but is not yet on dialysis.  Will obtain CT of her abdomen pelvis without contrast for further evaluation.  Will give pain and nausea medicine.  Differential includes viral gastroenteritis, colitis, bowel obstruction, kidney stone, pancreatitis.  ED PROGRESS: CT scan shows persistent fat stranding and poorly  defined fat planes around the pancreatic head favoring recurrent pancreatitis.  There is a possible gallstone within the cystic duct remnant but no inflammatory changes noted.  Her retroperitoneal hematoma is improving.  Patient's aortobiiliac stent graft repair shows no acute change.  Patient states she is taking 10 mg of hydrocodone at home with some relief in pain.  She is taking Phenergan for nausea.  States she does feel like her pain is slowly getting better.  Will give second dose of IV pain medicine here.  We will close the loop and discuss with general surgery as it is Dr. Dema Severin who sent her to the emergency department today.  7:30 AM  D/w Janett Billow, PA with general surgery who will see patient in ED.  They do not feel patient needs admission from their standpoint and can follow-up with GI as an outpatient.  She was seen in their office yesterday morning.  Her lipase is actually appears to be down from her previous.  Her abdominal exam is nonsurgical, non-peritoneal.  I have discussed this with the patient who is comfortable with this plan to be discharged home with outpatient follow-up.  I have offered admission for pain control and GI consultation but she states she would rather go home.  She has hydrocodone at home and I will discharge her with a prescription of Phenergan.  She is seen by Dr. group to with GI and will follow up closely and see her primary care physician as well.  She states she is fine with a liquid diet for the next several days and then will progress slowly.  We discussed at length return precautions.   At this time, I do not feel there is any life-threatening condition present. I have reviewed and discussed all results (EKG, imaging, lab, urine as appropriate) and exam findings with patient/family. I have reviewed nursing notes and appropriate previous records.  I feel the patient is safe to be discharged home without further emergent workup and can continue workup as an outpatient  as needed. Discussed usual and customary return precautions. Patient/family verbalize understanding and are comfortable with this plan.  Outpatient follow-up has been provided if needed. All questions have been answered.    Mical Kicklighter, Delice Bison, DO 05/16/18 [phone]

## 2018-05-16 NOTE — Discharge Instructions (Signed)
Please follow-up closely with your primary care physician and gastroenterology.  Please follow-up with your general surgeon in 2 weeks.  You may take your hydrocodone for pain control as needed.

## 2018-05-16 NOTE — ED Notes (Signed)
Patient taken to CT.

## 2018-05-17 NOTE — Progress Notes (Signed)
Pt scheduled to see Dr. Bryan Lemma 05/29/18@3 :30pm. Follow-up abd pain from ER visit. Pt is a [name] pt but will see Dr. Bryan Lemma due to availability.

## 2018-05-28 ENCOUNTER — Other Ambulatory Visit: Payer: Self-pay

## 2018-05-28 ENCOUNTER — Emergency Department (HOSPITAL_COMMUNITY): Payer: Medicare Other

## 2018-05-28 ENCOUNTER — Inpatient Hospital Stay (HOSPITAL_COMMUNITY)
Admission: EM | Admit: 2018-05-28 | Discharge: 2018-06-26 | DRG: 438 | Disposition: A | Discharge status: Skilled Nursing Facility | Payer: Medicare Other | Attending: Internal Medicine | Admitting: Internal Medicine

## 2018-05-28 ENCOUNTER — Encounter (HOSPITAL_COMMUNITY): Payer: Self-pay | Admitting: *Deleted

## 2018-05-28 DIAGNOSIS — Z885 Allergy status to narcotic agent status: Secondary | ICD-10-CM

## 2018-05-28 DIAGNOSIS — F17200 Nicotine dependence, unspecified, uncomplicated: Secondary | ICD-10-CM | POA: Diagnosis present

## 2018-05-28 DIAGNOSIS — E1165 Type 2 diabetes mellitus with hyperglycemia: Secondary | ICD-10-CM | POA: Diagnosis present

## 2018-05-28 DIAGNOSIS — R609 Edema, unspecified: Secondary | ICD-10-CM | POA: Diagnosis not present

## 2018-05-28 DIAGNOSIS — K863 Pseudocyst of pancreas: Secondary | ICD-10-CM | POA: Diagnosis present

## 2018-05-28 DIAGNOSIS — L03113 Cellulitis of right upper limb: Secondary | ICD-10-CM | POA: Diagnosis not present

## 2018-05-28 DIAGNOSIS — Z9841 Cataract extraction status, right eye: Secondary | ICD-10-CM

## 2018-05-28 DIAGNOSIS — K859 Acute pancreatitis without necrosis or infection, unspecified: Secondary | ICD-10-CM | POA: Diagnosis not present

## 2018-05-28 DIAGNOSIS — Z951 Presence of aortocoronary bypass graft: Secondary | ICD-10-CM | POA: Diagnosis not present

## 2018-05-28 DIAGNOSIS — E875 Hyperkalemia: Secondary | ICD-10-CM | POA: Diagnosis not present

## 2018-05-28 DIAGNOSIS — K861 Other chronic pancreatitis: Secondary | ICD-10-CM | POA: Diagnosis present

## 2018-05-28 DIAGNOSIS — R52 Pain, unspecified: Secondary | ICD-10-CM | POA: Diagnosis not present

## 2018-05-28 DIAGNOSIS — F17210 Nicotine dependence, cigarettes, uncomplicated: Secondary | ICD-10-CM | POA: Diagnosis present

## 2018-05-28 DIAGNOSIS — D62 Acute posthemorrhagic anemia: Secondary | ICD-10-CM | POA: Diagnosis present

## 2018-05-28 DIAGNOSIS — I25118 Atherosclerotic heart disease of native coronary artery with other forms of angina pectoris: Secondary | ICD-10-CM | POA: Diagnosis not present

## 2018-05-28 DIAGNOSIS — I4891 Unspecified atrial fibrillation: Secondary | ICD-10-CM | POA: Diagnosis present

## 2018-05-28 DIAGNOSIS — K8591 Acute pancreatitis with uninfected necrosis, unspecified: Secondary | ICD-10-CM | POA: Diagnosis present

## 2018-05-28 DIAGNOSIS — D500 Iron deficiency anemia secondary to blood loss (chronic): Secondary | ICD-10-CM | POA: Diagnosis not present

## 2018-05-28 DIAGNOSIS — I5032 Chronic diastolic (congestive) heart failure: Secondary | ICD-10-CM | POA: Diagnosis present

## 2018-05-28 DIAGNOSIS — R29898 Other symptoms and signs involving the musculoskeletal system: Secondary | ICD-10-CM | POA: Diagnosis not present

## 2018-05-28 DIAGNOSIS — L089 Local infection of the skin and subcutaneous tissue, unspecified: Secondary | ICD-10-CM

## 2018-05-28 DIAGNOSIS — J449 Chronic obstructive pulmonary disease, unspecified: Secondary | ICD-10-CM | POA: Diagnosis present

## 2018-05-28 DIAGNOSIS — I70209 Unspecified atherosclerosis of native arteries of extremities, unspecified extremity: Secondary | ICD-10-CM | POA: Diagnosis present

## 2018-05-28 DIAGNOSIS — M7989 Other specified soft tissue disorders: Secondary | ICD-10-CM | POA: Diagnosis not present

## 2018-05-28 DIAGNOSIS — R64 Cachexia: Secondary | ICD-10-CM | POA: Diagnosis present

## 2018-05-28 DIAGNOSIS — I214 Non-ST elevation (NSTEMI) myocardial infarction: Secondary | ICD-10-CM

## 2018-05-28 DIAGNOSIS — I248 Other forms of acute ischemic heart disease: Secondary | ICD-10-CM | POA: Diagnosis not present

## 2018-05-28 DIAGNOSIS — E785 Hyperlipidemia, unspecified: Secondary | ICD-10-CM | POA: Diagnosis present

## 2018-05-28 DIAGNOSIS — R599 Enlarged lymph nodes, unspecified: Secondary | ICD-10-CM | POA: Diagnosis present

## 2018-05-28 DIAGNOSIS — R6510 Systemic inflammatory response syndrome (SIRS) of non-infectious origin without acute organ dysfunction: Secondary | ICD-10-CM | POA: Diagnosis present

## 2018-05-28 DIAGNOSIS — Z825 Family history of asthma and other chronic lower respiratory diseases: Secondary | ICD-10-CM

## 2018-05-28 DIAGNOSIS — J411 Mucopurulent chronic bronchitis: Secondary | ICD-10-CM | POA: Diagnosis not present

## 2018-05-28 DIAGNOSIS — I700 Atherosclerosis of aorta: Secondary | ICD-10-CM | POA: Diagnosis present

## 2018-05-28 DIAGNOSIS — R404 Transient alteration of awareness: Secondary | ICD-10-CM | POA: Diagnosis not present

## 2018-05-28 DIAGNOSIS — R1031 Right lower quadrant pain: Secondary | ICD-10-CM | POA: Diagnosis not present

## 2018-05-28 DIAGNOSIS — Z9981 Dependence on supplemental oxygen: Secondary | ICD-10-CM

## 2018-05-28 DIAGNOSIS — I10 Essential (primary) hypertension: Secondary | ICD-10-CM | POA: Diagnosis present

## 2018-05-28 DIAGNOSIS — D649 Anemia, unspecified: Secondary | ICD-10-CM | POA: Diagnosis not present

## 2018-05-28 DIAGNOSIS — I132 Hypertensive heart and chronic kidney disease with heart failure and with stage 5 chronic kidney disease, or end stage renal disease: Secondary | ICD-10-CM | POA: Diagnosis present

## 2018-05-28 DIAGNOSIS — Z79891 Long term (current) use of opiate analgesic: Secondary | ICD-10-CM

## 2018-05-28 DIAGNOSIS — R188 Other ascites: Secondary | ICD-10-CM | POA: Diagnosis present

## 2018-05-28 DIAGNOSIS — I252 Old myocardial infarction: Secondary | ICD-10-CM

## 2018-05-28 DIAGNOSIS — R748 Abnormal levels of other serum enzymes: Secondary | ICD-10-CM | POA: Diagnosis not present

## 2018-05-28 DIAGNOSIS — N184 Chronic kidney disease, stage 4 (severe): Secondary | ICD-10-CM | POA: Diagnosis present

## 2018-05-28 DIAGNOSIS — R0602 Shortness of breath: Secondary | ICD-10-CM

## 2018-05-28 DIAGNOSIS — E8809 Other disorders of plasma-protein metabolism, not elsewhere classified: Secondary | ICD-10-CM | POA: Diagnosis present

## 2018-05-28 DIAGNOSIS — M109 Gout, unspecified: Secondary | ICD-10-CM | POA: Diagnosis present

## 2018-05-28 DIAGNOSIS — R5381 Other malaise: Secondary | ICD-10-CM

## 2018-05-28 DIAGNOSIS — Z7902 Long term (current) use of antithrombotics/antiplatelets: Secondary | ICD-10-CM

## 2018-05-28 DIAGNOSIS — R627 Adult failure to thrive: Secondary | ICD-10-CM | POA: Diagnosis present

## 2018-05-28 DIAGNOSIS — E1151 Type 2 diabetes mellitus with diabetic peripheral angiopathy without gangrene: Secondary | ICD-10-CM | POA: Diagnosis present

## 2018-05-28 DIAGNOSIS — R339 Retention of urine, unspecified: Secondary | ICD-10-CM | POA: Diagnosis not present

## 2018-05-28 DIAGNOSIS — D72829 Elevated white blood cell count, unspecified: Secondary | ICD-10-CM

## 2018-05-28 DIAGNOSIS — N185 Chronic kidney disease, stage 5: Secondary | ICD-10-CM | POA: Diagnosis present

## 2018-05-28 DIAGNOSIS — J4489 Other specified chronic obstructive pulmonary disease: Secondary | ICD-10-CM | POA: Diagnosis present

## 2018-05-28 DIAGNOSIS — Z8349 Family history of other endocrine, nutritional and metabolic diseases: Secondary | ICD-10-CM

## 2018-05-28 DIAGNOSIS — N179 Acute kidney failure, unspecified: Secondary | ICD-10-CM | POA: Diagnosis not present

## 2018-05-28 DIAGNOSIS — Z955 Presence of coronary angioplasty implant and graft: Secondary | ICD-10-CM

## 2018-05-28 DIAGNOSIS — S50312S Abrasion of left elbow, sequela: Secondary | ICD-10-CM

## 2018-05-28 DIAGNOSIS — I5033 Acute on chronic diastolic (congestive) heart failure: Secondary | ICD-10-CM | POA: Diagnosis present

## 2018-05-28 DIAGNOSIS — K8689 Other specified diseases of pancreas: Secondary | ICD-10-CM | POA: Diagnosis not present

## 2018-05-28 DIAGNOSIS — I480 Paroxysmal atrial fibrillation: Secondary | ICD-10-CM | POA: Diagnosis not present

## 2018-05-28 DIAGNOSIS — E876 Hypokalemia: Secondary | ICD-10-CM | POA: Diagnosis present

## 2018-05-28 DIAGNOSIS — R778 Other specified abnormalities of plasma proteins: Secondary | ICD-10-CM | POA: Diagnosis present

## 2018-05-28 DIAGNOSIS — R1084 Generalized abdominal pain: Secondary | ICD-10-CM | POA: Diagnosis not present

## 2018-05-28 DIAGNOSIS — E1122 Type 2 diabetes mellitus with diabetic chronic kidney disease: Secondary | ICD-10-CM | POA: Diagnosis not present

## 2018-05-28 DIAGNOSIS — R601 Generalized edema: Secondary | ICD-10-CM | POA: Diagnosis not present

## 2018-05-28 DIAGNOSIS — Z4659 Encounter for fitting and adjustment of other gastrointestinal appliance and device: Secondary | ICD-10-CM

## 2018-05-28 DIAGNOSIS — Z9049 Acquired absence of other specified parts of digestive tract: Secondary | ICD-10-CM

## 2018-05-28 DIAGNOSIS — Z91041 Radiographic dye allergy status: Secondary | ICD-10-CM

## 2018-05-28 DIAGNOSIS — L8994 Pressure ulcer of unspecified site, stage 4: Secondary | ICD-10-CM

## 2018-05-28 DIAGNOSIS — E43 Unspecified severe protein-calorie malnutrition: Secondary | ICD-10-CM | POA: Diagnosis present

## 2018-05-28 DIAGNOSIS — R194 Change in bowel habit: Secondary | ICD-10-CM | POA: Diagnosis not present

## 2018-05-28 DIAGNOSIS — Z90710 Acquired absence of both cervix and uterus: Secondary | ICD-10-CM

## 2018-05-28 DIAGNOSIS — Z888 Allergy status to other drugs, medicaments and biological substances status: Secondary | ICD-10-CM

## 2018-05-28 DIAGNOSIS — Z79899 Other long term (current) drug therapy: Secondary | ICD-10-CM

## 2018-05-28 DIAGNOSIS — Z8679 Personal history of other diseases of the circulatory system: Secondary | ICD-10-CM

## 2018-05-28 DIAGNOSIS — K08109 Complete loss of teeth, unspecified cause, unspecified class: Secondary | ICD-10-CM | POA: Diagnosis present

## 2018-05-28 DIAGNOSIS — Z515 Encounter for palliative care: Secondary | ICD-10-CM

## 2018-05-28 DIAGNOSIS — Z6820 Body mass index (BMI) 20.0-20.9, adult: Secondary | ICD-10-CM

## 2018-05-28 DIAGNOSIS — K219 Gastro-esophageal reflux disease without esophagitis: Secondary | ICD-10-CM | POA: Diagnosis not present

## 2018-05-28 DIAGNOSIS — R7989 Other specified abnormal findings of blood chemistry: Secondary | ICD-10-CM | POA: Diagnosis present

## 2018-05-28 DIAGNOSIS — L89311 Pressure ulcer of right buttock, stage 1: Secondary | ICD-10-CM | POA: Diagnosis not present

## 2018-05-28 DIAGNOSIS — Z0189 Encounter for other specified special examinations: Secondary | ICD-10-CM

## 2018-05-28 DIAGNOSIS — R911 Solitary pulmonary nodule: Secondary | ICD-10-CM | POA: Diagnosis present

## 2018-05-28 DIAGNOSIS — Z833 Family history of diabetes mellitus: Secondary | ICD-10-CM

## 2018-05-28 DIAGNOSIS — R935 Abnormal findings on diagnostic imaging of other abdominal regions, including retroperitoneum: Secondary | ICD-10-CM | POA: Diagnosis not present

## 2018-05-28 DIAGNOSIS — Z83438 Family history of other disorder of lipoprotein metabolism and other lipidemia: Secondary | ICD-10-CM

## 2018-05-28 DIAGNOSIS — D539 Nutritional anemia, unspecified: Secondary | ICD-10-CM | POA: Diagnosis present

## 2018-05-28 DIAGNOSIS — IMO0002 Reserved for concepts with insufficient information to code with codable children: Secondary | ICD-10-CM | POA: Diagnosis present

## 2018-05-28 DIAGNOSIS — K920 Hematemesis: Secondary | ICD-10-CM

## 2018-05-28 DIAGNOSIS — R4189 Other symptoms and signs involving cognitive functions and awareness: Secondary | ICD-10-CM | POA: Diagnosis not present

## 2018-05-28 DIAGNOSIS — I82619 Acute embolism and thrombosis of superficial veins of unspecified upper extremity: Secondary | ICD-10-CM | POA: Diagnosis not present

## 2018-05-28 DIAGNOSIS — K2211 Ulcer of esophagus with bleeding: Secondary | ICD-10-CM | POA: Diagnosis present

## 2018-05-28 DIAGNOSIS — R571 Hypovolemic shock: Secondary | ICD-10-CM | POA: Diagnosis not present

## 2018-05-28 DIAGNOSIS — I82409 Acute embolism and thrombosis of unspecified deep veins of unspecified lower extremity: Secondary | ICD-10-CM | POA: Diagnosis not present

## 2018-05-28 DIAGNOSIS — K567 Ileus, unspecified: Secondary | ICD-10-CM | POA: Diagnosis not present

## 2018-05-28 DIAGNOSIS — E639 Nutritional deficiency, unspecified: Secondary | ICD-10-CM | POA: Diagnosis not present

## 2018-05-28 DIAGNOSIS — I340 Nonrheumatic mitral (valve) insufficiency: Secondary | ICD-10-CM | POA: Diagnosis present

## 2018-05-28 DIAGNOSIS — J9611 Chronic respiratory failure with hypoxia: Secondary | ICD-10-CM | POA: Diagnosis present

## 2018-05-28 DIAGNOSIS — I272 Pulmonary hypertension, unspecified: Secondary | ICD-10-CM | POA: Diagnosis present

## 2018-05-28 DIAGNOSIS — I2489 Other forms of acute ischemic heart disease: Secondary | ICD-10-CM

## 2018-05-28 DIAGNOSIS — Z7951 Long term (current) use of inhaled steroids: Secondary | ICD-10-CM

## 2018-05-28 DIAGNOSIS — Z952 Presence of prosthetic heart valve: Secondary | ICD-10-CM

## 2018-05-28 DIAGNOSIS — Z794 Long term (current) use of insulin: Secondary | ICD-10-CM

## 2018-05-28 DIAGNOSIS — I160 Hypertensive urgency: Secondary | ICD-10-CM | POA: Diagnosis present

## 2018-05-28 DIAGNOSIS — Z7189 Other specified counseling: Secondary | ICD-10-CM | POA: Diagnosis not present

## 2018-05-28 DIAGNOSIS — E1129 Type 2 diabetes mellitus with other diabetic kidney complication: Secondary | ICD-10-CM | POA: Diagnosis present

## 2018-05-28 DIAGNOSIS — Z8249 Family history of ischemic heart disease and other diseases of the circulatory system: Secondary | ICD-10-CM

## 2018-05-28 DIAGNOSIS — R1110 Vomiting, unspecified: Secondary | ICD-10-CM

## 2018-05-28 DIAGNOSIS — L89321 Pressure ulcer of left buttock, stage 1: Secondary | ICD-10-CM | POA: Diagnosis not present

## 2018-05-28 DIAGNOSIS — L8990 Pressure ulcer of unspecified site, unspecified stage: Secondary | ICD-10-CM

## 2018-05-28 DIAGNOSIS — K862 Cyst of pancreas: Secondary | ICD-10-CM | POA: Diagnosis not present

## 2018-05-28 DIAGNOSIS — I2510 Atherosclerotic heart disease of native coronary artery without angina pectoris: Secondary | ICD-10-CM | POA: Diagnosis present

## 2018-05-28 HISTORY — DX: Acute pancreatitis without necrosis or infection, unspecified: K85.90

## 2018-05-28 LAB — CBC
HCT: 34.2 % — ABNORMAL LOW (ref 36.0–46.0)
HEMOGLOBIN: 11 g/dL — AB (ref 12.0–15.0)
MCH: 31.4 pg (ref 26.0–34.0)
MCHC: 32.2 g/dL (ref 30.0–36.0)
MCV: 97.7 fL (ref 78.0–100.0)
Platelets: 339 10*3/uL (ref 150–400)
RBC: 3.5 MIL/uL — AB (ref 3.87–5.11)
RDW: 16.7 % — ABNORMAL HIGH (ref 11.5–15.5)
WBC: 23.7 10*3/uL — ABNORMAL HIGH (ref 4.0–10.5)

## 2018-05-28 LAB — TYPE AND SCREEN
ABO/RH(D): B POS
ANTIBODY SCREEN: NEGATIVE

## 2018-05-28 LAB — COMPREHENSIVE METABOLIC PANEL
ALK PHOS: 48 U/L (ref 38–126)
ALT: 8 U/L (ref 0–44)
AST: 18 U/L (ref 15–41)
Albumin: 2.4 g/dL — ABNORMAL LOW (ref 3.5–5.0)
Anion gap: 17 — ABNORMAL HIGH (ref 5–15)
BUN: 27 mg/dL — ABNORMAL HIGH (ref 8–23)
CALCIUM: 8.1 mg/dL — AB (ref 8.9–10.3)
CO2: 27 mmol/L (ref 22–32)
Chloride: 97 mmol/L — ABNORMAL LOW (ref 98–111)
Creatinine, Ser: 1.8 mg/dL — ABNORMAL HIGH (ref 0.44–1.00)
GFR calc non Af Amer: 28 mL/min — ABNORMAL LOW (ref >60)
GFR, EST AFRICAN AMERICAN: 33 mL/min — AB (ref >60)
Glucose, Bld: 178 mg/dL — ABNORMAL HIGH (ref 70–99)
POTASSIUM: 3.1 mmol/L — AB (ref 3.5–5.1)
SODIUM: 141 mmol/L (ref 135–145)
TOTAL PROTEIN: 5.2 g/dL — AB (ref 6.5–8.1)
Total Bilirubin: 1.7 mg/dL — ABNORMAL HIGH (ref 0.3–1.2)

## 2018-05-28 LAB — LIPASE, BLOOD: LIPASE: 399 U/L — AB (ref 11–51)

## 2018-05-28 LAB — I-STAT CG4 LACTIC ACID, ED: Lactic Acid, Venous: 1.12 mmol/L (ref 0.5–1.9)

## 2018-05-28 LAB — I-STAT TROPONIN, ED: Troponin i, poc: 0.13 ng/mL (ref 0.00–0.08)

## 2018-05-28 MED ORDER — PIPERACILLIN-TAZOBACTAM 3.375 G IVPB
3.3750 g | Freq: Three times a day (TID) | INTRAVENOUS | Status: AC
  Administered 2018-05-29 – 2018-06-05 (×23): 3.375 g via INTRAVENOUS
  Filled 2018-05-28 (×24): qty 50

## 2018-05-28 MED ORDER — HYDROMORPHONE HCL 1 MG/ML IJ SOLN
0.5000 mg | Freq: Once | INTRAMUSCULAR | Status: AC
  Administered 2018-05-28: 0.5 mg via INTRAVENOUS
  Filled 2018-05-28: qty 1

## 2018-05-28 MED ORDER — FENOFIBRATE 160 MG PO TABS
160.0000 mg | ORAL_TABLET | Freq: Every day | ORAL | Status: DC
  Administered 2018-05-28 – 2018-06-07 (×11): 160 mg via ORAL
  Filled 2018-05-28 (×11): qty 1

## 2018-05-28 MED ORDER — ALPRAZOLAM 0.5 MG PO TABS
0.5000 mg | ORAL_TABLET | Freq: Every day | ORAL | Status: DC
  Administered 2018-05-28 – 2018-06-25 (×29): 0.5 mg via ORAL
  Filled 2018-05-28 (×29): qty 1

## 2018-05-28 MED ORDER — HYDROXYZINE HCL 10 MG PO TABS
10.0000 mg | ORAL_TABLET | Freq: Three times a day (TID) | ORAL | Status: DC | PRN
  Filled 2018-05-28: qty 1

## 2018-05-28 MED ORDER — HYDROMORPHONE HCL 1 MG/ML IJ SOLN
1.0000 mg | INTRAMUSCULAR | Status: DC | PRN
  Administered 2018-05-28 – 2018-06-15 (×77): 1 mg via INTRAVENOUS
  Filled 2018-05-28 (×79): qty 1

## 2018-05-28 MED ORDER — ISOSORBIDE MONONITRATE ER 30 MG PO TB24: 15.0000 mg | ORAL_TABLET | Freq: Every day | ORAL | Status: DC

## 2018-05-28 MED ORDER — CLONIDINE HCL 0.2 MG PO TABS
0.2000 mg | ORAL_TABLET | Freq: Three times a day (TID) | ORAL | Status: DC
  Administered 2018-05-28 – 2018-06-05 (×25): 0.2 mg via ORAL
  Filled 2018-05-28 (×25): qty 1

## 2018-05-28 MED ORDER — MOMETASONE FURO-FORMOTEROL FUM 200-5 MCG/ACT IN AERO
2.0000 | INHALATION_SPRAY | Freq: Two times a day (BID) | RESPIRATORY_TRACT | Status: DC
  Administered 2018-05-29 – 2018-06-26 (×51): 2 via RESPIRATORY_TRACT
  Filled 2018-05-28 (×3): qty 8.8

## 2018-05-28 MED ORDER — FAMOTIDINE 20 MG PO TABS
20.0000 mg | ORAL_TABLET | Freq: Every day | ORAL | Status: DC
  Administered 2018-05-29 – 2018-06-14 (×17): 20 mg via ORAL
  Filled 2018-05-28 (×17): qty 1

## 2018-05-28 MED ORDER — ACETAMINOPHEN 650 MG RE SUPP: 650.0000 mg | Freq: Four times a day (QID) | RECTAL | Status: DC | PRN

## 2018-05-28 MED ORDER — ALLOPURINOL 100 MG PO TABS
100.0000 mg | ORAL_TABLET | Freq: Every day | ORAL | Status: DC
  Administered 2018-05-29 – 2018-06-08 (×11): 100 mg via ORAL
  Filled 2018-05-28 (×11): qty 1

## 2018-05-28 MED ORDER — HYDROMORPHONE HCL 1 MG/ML IJ SOLN
1.0000 mg | Freq: Once | INTRAMUSCULAR | Status: AC
  Administered 2018-05-28: 1 mg via INTRAVENOUS
  Filled 2018-05-28: qty 1

## 2018-05-28 MED ORDER — AMLODIPINE BESYLATE 5 MG PO TABS
10.0000 mg | ORAL_TABLET | Freq: Once | ORAL | Status: AC
  Administered 2018-05-28: 10 mg via ORAL
  Filled 2018-05-28: qty 2

## 2018-05-28 MED ORDER — POTASSIUM CHLORIDE 10 MEQ/100ML IV SOLN
10.0000 meq | INTRAVENOUS | Status: DC
  Administered 2018-05-28: 10 meq via INTRAVENOUS
  Filled 2018-05-28: qty 100

## 2018-05-28 MED ORDER — HYDROCODONE-ACETAMINOPHEN 5-325 MG PO TABS
1.0000 | ORAL_TABLET | Freq: Four times a day (QID) | ORAL | Status: DC | PRN
  Administered 2018-05-29 – 2018-06-19 (×15): 1 via ORAL
  Filled 2018-05-28 (×18): qty 1

## 2018-05-28 MED ORDER — PIPERACILLIN-TAZOBACTAM 3.375 G IVPB 30 MIN
3.3750 g | Freq: Once | INTRAVENOUS | Status: AC
  Administered 2018-05-28: 3.375 g via INTRAVENOUS

## 2018-05-28 MED ORDER — ONDANSETRON HCL 4 MG/2ML IJ SOLN
4.0000 mg | Freq: Once | INTRAMUSCULAR | Status: AC
  Administered 2018-05-28: 4 mg via INTRAVENOUS
  Filled 2018-05-28: qty 2

## 2018-05-28 MED ORDER — FERROUS SULFATE 325 (65 FE) MG PO TABS: 325.0000 mg | ORAL_TABLET | Freq: Two times a day (BID) | ORAL | Status: DC

## 2018-05-28 MED ORDER — HYDRALAZINE HCL 50 MG PO TABS
100.0000 mg | ORAL_TABLET | Freq: Three times a day (TID) | ORAL | Status: DC
  Administered 2018-05-28 – 2018-06-14 (×51): 100 mg via ORAL
  Filled 2018-05-28 (×51): qty 2

## 2018-05-28 MED ORDER — GABAPENTIN 300 MG PO CAPS
300.0000 mg | ORAL_CAPSULE | Freq: Two times a day (BID) | ORAL | Status: DC
  Administered 2018-05-29 – 2018-06-18 (×39): 300 mg via ORAL
  Filled 2018-05-28 (×26): qty 1
  Filled 2018-05-28: qty 3
  Filled 2018-05-28 (×12): qty 1

## 2018-05-28 MED ORDER — HEPARIN SODIUM (PORCINE) 5000 UNIT/ML IJ SOLN
5000.0000 [IU] | Freq: Three times a day (TID) | INTRAMUSCULAR | Status: DC
  Administered 2018-05-29 – 2018-06-08 (×32): 5000 [IU] via SUBCUTANEOUS
  Filled 2018-05-28 (×29): qty 1

## 2018-05-28 MED ORDER — ISOSORBIDE MONONITRATE ER 30 MG PO TB24
15.0000 mg | ORAL_TABLET | Freq: Every day | ORAL | Status: DC
  Administered 2018-05-28 – 2018-06-14 (×18): 15 mg via ORAL
  Filled 2018-05-28 (×17): qty 1

## 2018-05-28 MED ORDER — ZOLPIDEM TARTRATE 5 MG PO TABS: 5.0000 mg | ORAL_TABLET | Freq: Every evening | ORAL | Status: DC | PRN

## 2018-05-28 MED ORDER — LEVALBUTEROL HCL 1.25 MG/0.5ML IN NEBU: 1.2500 mg | INHALATION_SOLUTION | Freq: Four times a day (QID) | RESPIRATORY_TRACT | Status: DC

## 2018-05-28 MED ORDER — ACETAMINOPHEN 325 MG PO TABS
650.0000 mg | ORAL_TABLET | Freq: Four times a day (QID) | ORAL | Status: DC | PRN
  Administered 2018-06-18 (×2): 650 mg via ORAL
  Filled 2018-05-28 (×4): qty 2

## 2018-05-28 MED ORDER — POTASSIUM CHLORIDE 10 MEQ/100ML IV SOLN
10.0000 meq | INTRAVENOUS | Status: AC
  Administered 2018-05-28 (×2): 10 meq via INTRAVENOUS
  Filled 2018-05-28: qty 100

## 2018-05-28 MED ORDER — PANTOPRAZOLE SODIUM 40 MG PO TBEC
40.0000 mg | DELAYED_RELEASE_TABLET | Freq: Every day | ORAL | Status: DC
  Administered 2018-05-29: 40 mg via ORAL
  Filled 2018-05-28: qty 1

## 2018-05-28 MED ORDER — MIRTAZAPINE 15 MG PO TABS
7.5000 mg | ORAL_TABLET | Freq: Every day | ORAL | Status: DC
  Administered 2018-05-28 – 2018-06-25 (×28): 7.5 mg via ORAL
  Filled 2018-05-28 (×30): qty 1

## 2018-05-28 MED ORDER — SODIUM CHLORIDE 0.9 % IV SOLN
INTRAVENOUS | Status: DC
  Administered 2018-05-28: via INTRAVENOUS

## 2018-05-28 MED ORDER — HYDRALAZINE HCL 20 MG/ML IJ SOLN: 5.0000 mg | INTRAMUSCULAR | Status: DC | PRN

## 2018-05-28 MED ORDER — VITAMIN D 1000 UNITS PO TABS
5000.0000 [IU] | ORAL_TABLET | Freq: Every day | ORAL | Status: DC
  Administered 2018-05-29 – 2018-06-26 (×27): 5000 [IU] via ORAL
  Filled 2018-05-28 (×29): qty 5

## 2018-05-28 MED ORDER — ASPIRIN 81 MG PO CHEW
324.0000 mg | CHEWABLE_TABLET | Freq: Once | ORAL | Status: AC
  Administered 2018-05-28: 324 mg via ORAL
  Filled 2018-05-28: qty 4

## 2018-05-28 MED ORDER — AMLODIPINE BESYLATE 10 MG PO TABS
10.0000 mg | ORAL_TABLET | Freq: Every day | ORAL | Status: DC
  Administered 2018-05-29 – 2018-06-05 (×8): 10 mg via ORAL
  Filled 2018-05-28 (×8): qty 1

## 2018-05-28 MED ORDER — CARVEDILOL 25 MG PO TABS
25.0000 mg | ORAL_TABLET | Freq: Two times a day (BID) | ORAL | Status: DC
  Administered 2018-05-29 – 2018-06-06 (×17): 25 mg via ORAL
  Filled 2018-05-28 (×17): qty 1

## 2018-05-28 MED ORDER — ATORVASTATIN CALCIUM 40 MG PO TABS
40.0000 mg | ORAL_TABLET | Freq: Every evening | ORAL | Status: DC
  Administered 2018-05-28 – 2018-06-08 (×12): 40 mg via ORAL
  Filled 2018-05-28 (×12): qty 1

## 2018-05-28 MED ORDER — HYDRALAZINE HCL 25 MG PO TABS
100.0000 mg | ORAL_TABLET | Freq: Once | ORAL | Status: AC
  Administered 2018-05-28: 100 mg via ORAL
  Filled 2018-05-28: qty 4

## 2018-05-28 MED ORDER — IPRATROPIUM-ALBUTEROL 0.5-2.5 (3) MG/3ML IN SOLN
3.0000 mL | Freq: Once | RESPIRATORY_TRACT | Status: AC
  Administered 2018-05-28: 3 mL via RESPIRATORY_TRACT
  Filled 2018-05-28: qty 3

## 2018-05-28 MED ORDER — DULOXETINE HCL 60 MG PO CPEP
60.0000 mg | ORAL_CAPSULE | Freq: Every day | ORAL | Status: DC
  Administered 2018-05-29 – 2018-06-26 (×26): 60 mg via ORAL
  Filled 2018-05-28 (×27): qty 1

## 2018-05-28 MED ORDER — CLOPIDOGREL BISULFATE 75 MG PO TABS
75.0000 mg | ORAL_TABLET | Freq: Every day | ORAL | Status: DC
  Administered 2018-05-29 – 2018-06-14 (×17): 75 mg via ORAL
  Filled 2018-05-28 (×17): qty 1

## 2018-05-28 MED ORDER — HYDRALAZINE HCL 20 MG/ML IJ SOLN
10.0000 mg | Freq: Once | INTRAMUSCULAR | Status: AC
  Administered 2018-05-28: 10 mg via INTRAVENOUS
  Filled 2018-05-28: qty 1

## 2018-05-28 NOTE — ED Triage Notes (Signed)
Pt and family reports multiple recent hospitalizations due to AAA, gallbladder and pancreatitis. Having return of severe RLQ pain with n/v/d. Having severe fatigue. Reports frequently needing blood transfusions in past.

## 2018-05-28 NOTE — ED Provider Notes (Signed)
[facility] EMERGENCY DEPARTMENT Provider Note   CSN: [id number] Arrival date & time: 05/28/18  1428     History   Chief Complaint Chief Complaint  Patient presents with  . Abdominal Pain  . Emesis    HPI [name] is a 67 y.o. female.  HPI   Patient is a 67 year old female with history of CAD s/p CABG, CHF, COPD, CKD, who presents to the emergency department with complaints of abdominal pain, vomiting, diarrhea for the last 4 days.   States that she has had chronic abdominal pain since AAA repair from 01/2018 however pain seems to be " a little bit worse" over the past few days.  Has had decreased p.o. intake due to vomiting.  States she has not taken her medication in the last 4 days.  Has had intermittent lower chest/epigastric pain.   Denies any current chest pain or shortness of breath. Does endorse wheezing stating that she has not been able to take her inhalers for her COPD.  Denies fevers or chills.  Denies urinary symptoms.   Records reviewed. Pt seen on 05/16/18 for similar sxs. Per note, "Patient had a cholecystectomy on April 19, 2018.  Has had history of gallstone pancreatitis.  States this feels similar she is having pain in her upper abdomen but also pain in her right lower quadrant.  She is status post AAA repair in May 2019 with subsequent retroperitoneal hematoma, appendectomy, hysterectomy.  "  Patient here with her daughter who assists with the history.  Past Medical History:  Diagnosis Date  . AAA (abdominal aortic aneurysm) ([place]) 5/09   3.7x3.3 by u/s 2009  . Abnormal EKG    deep TW inversions chronic  . Anemia   . CAD (coronary artery disease)    a. CABG 03/2011 LIMA to LAD, SVG to OM, SVG to PDA. b. cath 06/25/2015 DES to SVG to rPDA, patent LIMA to LAD, patent SVG to OM  . carotid stenosis 5/09   S/p L CEA ;  60-79% bilat ICA by preCABG dopplers 7/12  . CHF (congestive heart failure) ([place])   . Chronic back pain    "all over"  (06/24/2015)  . Chronic bronchitis ([place])    "get it pretty much q yr" (06/24/2015)  . CKD (chronic kidney disease)   . Complication of anesthesia 1966   "problem w/ether"  . COPD (chronic obstructive pulmonary disease) ([place])   . Coronary artery disease 5/09   s/p CABG 7/12   L-LAD, S-OM, S-PDA  . Depression   . GERD (gastroesophageal reflux disease)    With hiatal hernia  . GIB (gastrointestinal bleeding) 9/11   S/p EGD with cautery at [facility]  . Heart murmur   . History of blood transfusion "a few times"   "all related to anemia" (06/24/2015)  . History of hiatal hernia   . Hyperlipidemia   . Hypertension   . Mitral regurgitation    3+ MR by intraoperative TEE;  s/p MV repair with Dr. Roxy Manns 7/12  . Myocardial infarction [facility]) 2012 "several"  . On home oxygen therapy    "2 liters at night; negative for sleep apnea"  . PAD (peripheral artery disease) (H[facility])    Severe; s/p bilateral renal artery stents, moderate in-stent restenosis  . Paroxysmal atrial fibrillation (H[facility])    coumadin;  echo 9/07: EF 60%, mild LVH;  s/p Cox Maze 7/12 with LAA clipping  . Subclavian artery stenosis, left (H[facility])    stented by Dr. Trula Slade  on 10/18 to help flow of her LIMA to LAD  . Type II diabetes mellitus [facility])     Patient Active Problem List   Diagnosis Date Noted  . Type II diabetes mellitus with renal manifestations ([place]) 05/28/2018  . Elevated troponin 05/28/2018  . Hypertensive urgency 05/28/2018  . Pancreatitis, recurrent 05/28/2018  . Hypokalemia 05/28/2018  . SIRS (systemic inflammatory response syndrome) ([place]) 05/28/2018  . Volume overload 04/17/2018  . Symptomatic anemia 04/14/2018  . Acute gallstone pancreatitis 04/14/2018  . Acute cholecystitis 04/14/2018  . Gastrointestinal hemorrhage   . Fatigue associated with anemia 04/09/2018  . Hypoglycemia 01/19/2018  . Type II diabetes mellitus ([place]) 01/19/2018  . Nausea with vomiting 01/19/2018  . Uncontrolled hypertension 01/19/2018  .  Hyperlipidemia 01/19/2018  . CAD (coronary artery disease) 01/19/2018  . COPD with hypoxia ([place]) 01/19/2018  . Chronic kidney disease (CKD), stage IV (severe) ([place]) 01/19/2018  . Retroperitoneal hematoma 01/19/2018  . Acute blood loss anemia 01/19/2018  . Chronic diastolic congestive heart failure ([place]) 01/19/2018  . AAA (abdominal aortic aneurysm) ([place]) 01/16/2018  . Elevated brain natriuretic peptide (BNP) level 10/07/2015  . Diabetes mellitus ([place]) 10/07/2015  . Pain in the chest   . PAD (peripheral artery disease) ([place]) 07/29/2015  . Chest pain 07/04/2015  . Acute on chronic kidney failure ([place]) 07/04/2015  . GERD (gastroesophageal reflux disease) 07/04/2015  . Anemia 06/26/2015  . Coronary artery disease involving autologous vein coronary bypass graft with unstable angina pectoris ([place]) 06/24/2015  . Angina at rest [facility])   . Subclavian artery stenosis, left ([place]) 06/23/2015  . AAA (abdominal aortic aneurysm) without rupture ([place]) 12/10/2014  . Subclavian steal syndrome 12/10/2014  . Renal artery stenosis ([place]) 09/04/2013  . Carotid artery stenosis 09/28/2012  . Mitral valve disorder 06/23/2011  . S/P CABG x 3 06/16/2011  . S/P Maze operation for atrial fibrillation 06/16/2011  . S/P MVR (mitral valve repair) 06/16/2011  . Follow-up examination following surgery 03/29/2011  . CKD (chronic kidney disease) 03/03/2011  . COPD (chronic obstructive pulmonary disease) ([place]) 03/03/2011  . Chronic diastolic heart failure ([place]) 02/26/2011  . Hypotension 02/26/2011  . NSTEMI (non-ST elevated myocardial infarction) ([place]) 02/26/2011  . Long term (current) use of anticoagulants 12/17/2010  . OBSTRUCTIVE SLEEP APNEA 09/04/2010  . HYPOXEMIA 09/04/2010  . SNORING 08/05/2010  . Atrial fibrillation ([place]) 12/09/2009  . WEIGHT LOSS 03/28/2009  . PALPITATIONS 03/28/2009  . PVD 11/21/2008  . HYPERLIPIDEMIA-MIXED 11/20/2008  . TOBACCO ABUSE 11/20/2008  . Essential hypertension 11/20/2008  .  Coronary artery disease involving native heart with angina pectoris ([place]) 11/20/2008  . ABDOMINAL AORTIC ANEURYSM 11/20/2008    Past Surgical History:  Procedure Laterality Date  . A/V FISTULAGRAM Left 03/21/2018   Procedure: A/V FISTULAGRAM;  Surgeon: Serafina Mitchell, MD;  Location: [place] CV LAB;  Service: Cardiovascular;  Laterality: Left;  . ABDOMINAL AORTIC ENDOVASCULAR STENT GRAFT N/A 01/17/2018   Procedure: ABDOMINAL AORTIC ENDOVASCULAR STENT GRAFT WITH CO2;  Surgeon: Serafina Mitchell, MD;  Location: [place];  Service: Vascular;  Laterality: N/A;  . ABDOMINAL HYSTERECTOMY  1990's  . APPENDECTOMY  Aug. 11, 2016   Ruptured  . AV FISTULA PLACEMENT Left 11/03/2017   Procedure: ARTERIOVENOUS (AV) FISTULA CREATION LEFT ARM;  Surgeon: Serafina Mitchell, MD;  Location: [place];  Service: Vascular;  Laterality: Left;  . BOWEL RESECTION  2013  . CARDIAC CATHETERIZATION N/A 06/24/2015   Procedure: Left Heart Cath and Cors/Grafts Angiography;  Surgeon: Leonie Man, MD;  Location: [place] [facility];  Service: Cardiovascular;  Laterality: N/A;  . CARDIAC CATHETERIZATION N/A 06/25/2015   Procedure: Coronary Stent Intervention;  Surgeon: Sherren Mocha, MD;  Location: [place] [facility];  Service: Cardiovascular;  Laterality: N/A;  . CAROTID ENDARTERECTOMY Left   . CATARACT EXTRACTION Right 03/27/2018  . CHOLECYSTECTOMY N/A 04/19/2018   Procedure: LAPAROSCOPIC CHOLECYSTECTOMY WITH INTRAOPERATIVE CHOLANGIOGRAM;  Surgeon: Rolm Bookbinder, MD;  Location: [place];  Service: General;  Laterality: N/A;  . CORONARY ANGIOPLASTY    . CORONARY ARTERY BYPASS GRAFT  03/05/2011   CABG X3 (LIMA to LAD, SVG to OM, SVG to PDA, EVH via left thigh  . CORONARY STENT PLACEMENT    . ESOPHAGOGASTRODUODENOSCOPY N/A 04/12/2018   Procedure: ESOPHAGOGASTRODUODENOSCOPY (EGD);  Surgeon: [name], MD;  Location: [facility] ENDOSCOPY;  Service: Endoscopy;  Laterality: N/A;  . LACERATION REPAIR Right 1990's   WRIST  . MAZE   03/05/2011   complete biatrial lesion set with clipping of LA appendage  . MITRAL VALVE REPAIR  03/05/2011   75mm Memo 3D ring annuloplasty for ischemic MR  . PERIPHERAL VASCULAR BALLOON ANGIOPLASTY Left 03/21/2018   Procedure: PERIPHERAL VASCULAR BALLOON ANGIOPLASTY;  Surgeon: Serafina Mitchell, MD;  Location: [place] [facility];  Service: Cardiovascular;  Laterality: Left;  . PERIPHERAL VASCULAR CATHETERIZATION N/A 06/24/2015   Procedure: Aortic Arch Angiography;  Surgeon: Serafina Mitchell, MD;  Location: [place] CV LAB;  Service: Cardiovascular;  Laterality: N/A;  . PERIPHERAL VASCULAR CATHETERIZATION  06/24/2015   Procedure: Peripheral Vascular Intervention;  Surgeon: Serafina Mitchell, MD;  Location: [place] [facility];  Service: Cardiovascular;;  . PERIPHERAL VASCULAR CATHETERIZATION N/A 06/24/2015   Procedure: Abdominal Aortogram;  Surgeon: Serafina Mitchell, MD;  Location: [place] [facility] LAB;  Service: Cardiovascular;  Laterality: N/A;  . RENAL ANGIOGRAM Bilateral 09/11/2013   Procedure: RENAL ANGIOGRAM;  Surgeon: Serafina Mitchell, MD;  Location[facility] CATH LAB;  Service: Cardiovascular;  Laterality: Bilateral;  . RIGHT FEMORAL-POPLITEAL BYPASS       OB History   None      Home Medications    Prior to Admission medications   Medication Sig Start Date End Date Taking? Authorizing Provider  acetaminophen (TYLENOL) 325 MG tablet Take 325 mg by mouth every 6 (six) hours as needed (for pain).    [provider]  albuterol (PROVENTIL HFA;VENTOLIN HFA) 108 (90 Base) MCG/ACT inhaler Inhale 2 puffs into the lungs every 4 (four) hours as needed for wheezing or shortness of breath. ProAir Patient taking differently: Inhale 2 puffs into the lungs every 4 (four) hours as needed for wheezing or shortness of breath.  10/08/15   Delfina Redwood, MD  allopurinol (ZYLOPRIM) 100 MG tablet Take 100 mg by mouth daily. 02/02/17   [provider]  ALPRAZolam Duanne Moron) 0.5 MG tablet Take 0.5  mg by mouth at bedtime.     [provider]  amLODipine (NORVASC) 10 MG tablet Take 10 mg by mouth daily.      [provider]  atorvastatin (LIPITOR) 40 MG tablet Take 40 mg by mouth every evening.  12/07/17   [provider]  budesonide-formoterol (SYMBICORT) 160-4.5 MCG/ACT inhaler Inhale 2 puffs into the lungs 2 (two) times daily as needed (for flares).    [provider]  carvedilol (COREG) 25 MG tablet TAKE 1 TABLET BY MOUTH 2 TIMES A DAY WITH A MEAL Patient taking differently: Take 25 mg by mouth 2 (two) times daily with a meal.  11/04/15  Burtis Junes, NP  Cholecalciferol (VITAMIN D3) 5000 units TABS Take 5,000 Units by mouth daily.    [provider]  cloNIDine (CATAPRES) 0.2 MG tablet Take 1 tablet (0.2 mg total) by mouth 3 (three) times daily. 07/10/15   Richardson Dopp T, PA-C  clopidogrel (PLAVIX) 75 MG tablet Take 1 tablet (75 mg total) by mouth daily with breakfast. 06/26/15   Almyra Deforest, PA  docusate sodium (COLACE) 100 MG capsule Take 200 mg by mouth daily as needed (when taking pain medications).     [provider]  DULoxetine (CYMBALTA) 60 MG capsule Take 60 mg by mouth daily.  10/26/17   [provider]  famotidine (PEPCID) 20 MG tablet TAKE 1 TABLET (20 MG TOTAL) BY MOUTH DAILY BEFORE SUPPER. Patient taking differently: Take 20 mg by mouth daily before supper.  01/27/16   Sherren Mocha, MD  fenofibrate 160 MG tablet Take 1 tablet (160 mg total) by mouth daily. Patient taking differently: Take 160 mg by mouth at bedtime.  10/08/15   Delfina Redwood, MD  ferrous sulfate 325 (65 FE) MG tablet Take 325 mg by mouth 2 (two) times daily with a meal.    [provider]  furosemide (LASIX) 40 MG tablet Take 40 mg by mouth See admin instructions. Take 40 mg by mouth once a day and may take an additional 40 mg day as needed for weight gain 05/30/17   [provider]  gabapentin (NEURONTIN) 300 MG capsule TAKE  1 CAPSULE(300 MG) BY MOUTH TWICE DAILY Patient taking differently: Take 300 mg by mouth 2 (two) times daily.  05/05/18   Serafina Mitchell, MD  hydrALAZINE (APRESOLINE) 100 MG tablet Take 100 mg by mouth 3 (three) times daily.    [provider]  HYDROcodone-acetaminophen (NORCO/VICODIN) 5-325 MG tablet Take 1 tablet by mouth every 6 (six) hours as needed for moderate pain. 04/20/18   Rolm Bookbinder, MD  isosorbide mononitrate (IMDUR) 30 MG 24 hr tablet Take 0.5 tablets (15 mg total) by mouth daily. 12/02/15   Sherren Mocha, MD  mirtazapine (REMERON) 7.5 MG tablet Take 7.5 mg by mouth at bedtime.  02/15/18 08/14/18  [provider]  pantoprazole (PROTONIX) 40 MG tablet Take 1 tablet (40 mg total) by mouth daily. 06/26/15   Almyra Deforest, PA  Potassium 99 MG TABS Take 99 mg by mouth daily.     [provider]  promethazine (PHENERGAN) 25 MG tablet Take 1 tablet (25 mg total) by mouth every 6 (six) hours as needed for nausea or vomiting. 05/16/18   Ward, Delice Bison, DO    Family History Family History  Problem Relation Age of Onset  . Emphysema Mother   . Heart disease Mother        before age 68  . Hypertension Mother   . Hyperlipidemia Mother   . Heart attack Mother   . AAA (abdominal aortic aneurysm) Mother        rupture  . Heart attack Father 11  . Emphysema Father   . Heart disease Father        before age 63  . Hyperlipidemia Father   . Hypertension Father   . Peripheral vascular disease Father   . Diabetes Brother   . Heart disease Brother        before age 62  . Hyperlipidemia Brother   . Hypertension Brother   . Heart attack Brother        CABG  . Heart disease  Other        Vascular disease in grandparents, uncles and dad  . Colon cancer Neg Hx     Social History Social History   Tobacco Use  . Smoking status: Current Every Day Smoker    Years: 50.00    Types: Cigarettes  . Smokeless tobacco: Never Used  . Tobacco comment: 3/4 pk per day    Substance Use Topics  . Alcohol use: Yes    Alcohol/week: 0.0 standard drinks    Comment: occasionally  . Drug use: Yes    Types: Marijuana     Allergies   Isosorbide; Isosorbide nitrate; Oxycodone-acetaminophen; Codeine; Contrast media [iodinated diagnostic agents]; Ioxaglate; Metrizamide; and Percocet [oxycodone-acetaminophen]   Review of Systems Review of Systems  Constitutional: Negative for chills and fever.  HENT: Negative for congestion, rhinorrhea and sore throat.   Eyes: Negative for visual disturbance.  Respiratory: Positive for cough and wheezing. Negative for shortness of breath.   Cardiovascular: Positive for chest pain. Negative for leg swelling.  Gastrointestinal: Positive for abdominal pain, diarrhea, nausea and vomiting. Negative for blood in stool and constipation.  Genitourinary: Negative for dysuria, flank pain, frequency, hematuria and urgency.  Musculoskeletal: Negative for back pain.  Skin: Negative for rash.  Neurological: Negative for dizziness, weakness, light-headedness, numbness and headaches.     Physical Exam Updated Vital Signs BP (!) 186/74   Pulse (!) 108   Temp 97.9 F (36.6 C) (Oral)   Resp 17   SpO2 98%   Physical Exam  Constitutional:  Chronically ill appearing, mild distress  HENT:  Head: Normocephalic and atraumatic.  Dry mucous membranes  Eyes: Pupils are equal, round, and reactive to light. Conjunctivae and EOM are normal. No scleral icterus.  Pale conjunctiva  Neck: Neck supple.  Cardiovascular: Normal rate, regular rhythm, normal heart sounds and intact distal pulses.  No murmur heard. Pulmonary/Chest: Effort normal and breath sounds normal. No stridor. No respiratory distress. She has no wheezes. She has no rales.  Abdominal: Soft. Bowel sounds are normal. There is generalized tenderness. There is guarding. There is no rigidity.  Musculoskeletal: She exhibits no edema.  Ecchymosis to mid back (has had since fall several  weeks ago per daughter)  Neurological: She is alert.  Skin: Skin is warm and dry. Capillary refill takes 2 to 3 seconds.  Psychiatric: She has a normal mood and affect.  Nursing note and vitals reviewed.    ED Treatments / Results  Labs (all labs ordered are listed, but only abnormal results are displayed) Labs Reviewed  COMPREHENSIVE METABOLIC PANEL - Abnormal; Notable for the following components:      Result Value   Potassium 3.1 (*)    Chloride 97 (*)    Glucose, Bld 178 (*)    BUN 27 (*)    Creatinine, Ser 1.80 (*)    Calcium 8.1 (*)    Total Protein 5.2 (*)    Albumin 2.4 (*)    Total Bilirubin 1.7 (*)    GFR calc non Af Amer 28 (*)    GFR calc Af Amer 33 (*)    Anion gap 17 (*)    All other components within normal limits  CBC - Abnormal; Notable for the following components:   WBC 23.7 (*)    RBC 3.50 (*)    Hemoglobin 11.0 (*)    HCT 34.2 (*)    RDW 16.7 (*)    All other components within normal limits  LIPASE, BLOOD - Abnormal; Notable for  the following components:   Lipase 399 (*)    All other components within normal limits  I-STAT TROPONIN, ED - Abnormal; Notable for the following components:   Troponin i, poc 0.13 (*)    All other components within normal limits  CULTURE, BLOOD (ROUTINE X 2)  CULTURE, BLOOD (ROUTINE X 2)  C DIFFICILE QUICK SCREEN W PCR REFLEX  URINALYSIS, ROUTINE W REFLEX MICROSCOPIC  I-STAT CG4 LACTIC ACID, ED  POC OCCULT BLOOD, ED  I-STAT TROPONIN, ED  TYPE AND SCREEN    EKG EKG Interpretation  Date/Time:  Sunday May 28 2018 17:59:16 EDT Ventricular Rate:  100 PR Interval:    QRS Duration: 111 QT Interval:  389 QTC Calculation: 512 R Axis:   153 Text Interpretation:  Atrial fibrillation RVH with secondary repolarization abnrm Prolonged QT interval Baseline wander in lead(s) III V6 Confirmed by Gerlene Fee [phone]) on 05/28/2018 6:04:04 PM    Radiology Ct Abdomen Pelvis Wo Contrast  Result Date:  05/28/2018 CLINICAL DATA:  New tiny right lung nodule on chest radiograph. Severe right lower quadrant abdominal pain with nausea, vomiting and diarrhea. Chronic kidney disease. Prior cholecystectomy 04/19/2018, hysterectomy and appendectomy. Prior bowel resection. EXAM: CT CHEST, ABDOMEN AND PELVIS WITHOUT CONTRAST TECHNIQUE: Multidetector CT imaging of the chest, abdomen and pelvis was performed following the standard protocol without IV contrast. COMPARISON:  Chest radiograph from earlier today. FINDINGS: CT CHEST FINDINGS Cardiovascular: Borderline mild cardiomegaly. No significant pericardial effusion/thickening. 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 Atherosclerotic nonaneurysmal thoracic aorta. Proximal left subclavian artery stent is noted. Dilated main pulmonary artery (4.0 cm diameter). Mediastinum/Nodes: No discrete thyroid nodules. Unremarkable esophagus. No axillary adenopathy. Mild right paratracheal adenopathy up to 1.1 cm (series 3/image 21). Mildly enlarged 1.1 cm subcarinal node (series 3/image 26). No discrete hilar adenopathy on this noncontrast scan. Lungs/Pleura: No pneumothorax. No pleural effusion. A few tiny calcified granulomas are present in the upper lobes bilaterally. No acute consolidative airspace disease, lung masses or significant pulmonary nodules. Diffuse interlobular septal thickening. Musculoskeletal: No aggressive appearing focal osseous lesions. Intact sternotomy wires. Moderate thoracic spondylosis. CT ABDOMEN PELVIS FINDINGS Hepatobiliary: Normal liver with no liver mass. Cholecystectomy. Bile ducts are within normal post cholecystectomy limits with CBD diameter 7 mm. Pancreas: Thickening of the pancreatic head and neck. Prominent peripancreatic fat stranding and ill-defined fluid in the pancreatic head and neck, worsened. Poorly defined 5.1 x 2.5 cm anterior pancreatic head complex fluid collection with heterogeneous internal density (series  3/image 66), increased from 2.3 x 1.2 cm. No definite pancreatic duct dilation. Severe wall thickening throughout the adjacent descending duodenum, worsened. Fluid collection inferior to the gallbladder fossa measures 5.0 x 3.0 cm (series 3/image 71) and demonstrates layering hyperdense material, new. Spleen: Normal size. No mass. Adrenals/Urinary Tract: Normal adrenals. No hydronephrosis. No renal stones. Partially exophytic simple 1.3 cm posterior interpolar left renal cyst. No additional contour deforming renal lesions. Normal bladder. Stomach/Bowel: Normal non-distended stomach. Normal caliber small bowel with no small bowel wall thickening. Appendectomy. New segmental wall thickening in the hepatic flexure of the colon. No additional sites of large bowel wall thickening. Stable postsurgical changes from distal large bowel resection. Vascular/Lymphatic: Atherosclerotic abdominal aorta with stable 5.0 cm infrarenal abdominal aortic aneurysm status post aorto bi-iliac stent graft repair. Surgical clips are noted in the right inguinal region. No pathologically enlarged lymph nodes in the abdomen or pelvis. Reproductive: Status post hysterectomy, with no abnormal findings at the vaginal cuff. No adnexal mass. Other: No pneumoperitoneum.  New small volume perihepatic ascites. Musculoskeletal: No aggressive appearing focal osseous lesions. Mild lumbar spondylosis. IMPRESSION: CT CHEST: 1. No lung masses or significant pulmonary nodules. 2. Borderline mild cardiomegaly. Diffuse interlobular septal thickening suggests mild pulmonary edema. No pleural effusions. 3. Dilated main pulmonary artery, suggesting pulmonary arterial hypertension. CT ABDOMEN PELVIS: 1. Worsening severe inflammatory changes surrounding the pancreatic head and neck, descending duodenum and hepatic flexure of the colon. Enlarging nonspecific complex poorly defined fluid collection in the anterior pancreatic head. New complex fluid collection  inferior to the gallbladder fossa with layering hyperdense material. Burtis Junes worsening pancreatitis with enlarging pancreatic pseudocysts. Marked wall thickening in the descending duodenum has worsened, and could be reactive, with peptic ulcer disease difficult to exclude. No free air. Short-term follow-up MRI or CT abdomen with IV contrast suggested. 2. New wall thickening throughout the hepatic flexure of the colon, probably reactive. 3. Cholecystectomy. No intrahepatic biliary ductal dilatation. CBD diameter 7 mm, within normal post cholecystectomy limits. 4. New small volume perihepatic ascites. 5.  Aortic Atherosclerosis (ICD10-I70.0). Electronically Signed   By: Ilona Sorrel M.D.   On: 05/28/2018 20:41   Ct Chest Wo Contrast  Result Date: 05/28/2018 CLINICAL DATA:  New tiny right lung nodule on chest radiograph. Severe right lower quadrant abdominal pain with nausea, vomiting and diarrhea. Chronic kidney disease. Prior cholecystectomy 04/19/2018, hysterectomy and appendectomy. Prior bowel resection. EXAM: CT CHEST, ABDOMEN AND PELVIS WITHOUT CONTRAST TECHNIQUE: Multidetector CT imaging of the chest, abdomen and pelvis was performed following the standard protocol without IV contrast. COMPARISON:  Chest radiograph from earlier today. FINDINGS: CT CHEST FINDINGS Cardiovascular: Borderline mild cardiomegaly. No significant pericardial effusion/thickening. 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 Atherosclerotic nonaneurysmal thoracic aorta. Proximal left subclavian artery stent is noted. Dilated main pulmonary artery (4.0 cm diameter). Mediastinum/Nodes: No discrete thyroid nodules. Unremarkable esophagus. No axillary adenopathy. Mild right paratracheal adenopathy up to 1.1 cm (series 3/image 21). Mildly enlarged 1.1 cm subcarinal node (series 3/image 26). No discrete hilar adenopathy on this noncontrast scan. Lungs/Pleura: No pneumothorax. No pleural effusion. A few tiny calcified  granulomas are present in the upper lobes bilaterally. No acute consolidative airspace disease, lung masses or significant pulmonary nodules. Diffuse interlobular septal thickening. Musculoskeletal: No aggressive appearing focal osseous lesions. Intact sternotomy wires. Moderate thoracic spondylosis. CT ABDOMEN PELVIS FINDINGS Hepatobiliary: Normal liver with no liver mass. Cholecystectomy. Bile ducts are within normal post cholecystectomy limits with CBD diameter 7 mm. Pancreas: Thickening of the pancreatic head and neck. Prominent peripancreatic fat stranding and ill-defined fluid in the pancreatic head and neck, worsened. Poorly defined 5.1 x 2.5 cm anterior pancreatic head complex fluid collection with heterogeneous internal density (series 3/image 66), increased from 2.3 x 1.2 cm. No definite pancreatic duct dilation. Severe wall thickening throughout the adjacent descending duodenum, worsened. Fluid collection inferior to the gallbladder fossa measures 5.0 x 3.0 cm (series 3/image 71) and demonstrates layering hyperdense material, new. Spleen: Normal size. No mass. Adrenals/Urinary Tract: Normal adrenals. No hydronephrosis. No renal stones. Partially exophytic simple 1.3 cm posterior interpolar left renal cyst. No additional contour deforming renal lesions. Normal bladder. Stomach/Bowel: Normal non-distended stomach. Normal caliber small bowel with no small bowel wall thickening. Appendectomy. New segmental wall thickening in the hepatic flexure of the colon. No additional sites of large bowel wall thickening. Stable postsurgical changes from distal large bowel resection. Vascular/Lymphatic: Atherosclerotic abdominal aorta with stable 5.0 cm infrarenal abdominal aortic aneurysm status post aorto bi-iliac stent graft repair. Surgical clips are noted in  the right inguinal region. No pathologically enlarged lymph nodes in the abdomen or pelvis. Reproductive: Status post hysterectomy, with no abnormal findings at  the vaginal cuff. No adnexal mass. Other: No pneumoperitoneum.  New small volume perihepatic ascites. Musculoskeletal: No aggressive appearing focal osseous lesions. Mild lumbar spondylosis. IMPRESSION: CT CHEST: 1. No lung masses or significant pulmonary nodules. 2. Borderline mild cardiomegaly. Diffuse interlobular septal thickening suggests mild pulmonary edema. No pleural effusions. 3. Dilated main pulmonary artery, suggesting pulmonary arterial hypertension. CT ABDOMEN PELVIS: 1. Worsening severe inflammatory changes surrounding the pancreatic head and neck, descending duodenum and hepatic flexure of the colon. Enlarging nonspecific complex poorly defined fluid collection in the anterior pancreatic head. New complex fluid collection inferior to the gallbladder fossa with layering hyperdense material. Burtis Junes worsening pancreatitis with enlarging pancreatic pseudocysts. Marked wall thickening in the descending duodenum has worsened, and could be reactive, with peptic ulcer disease difficult to exclude. No free air. Short-term follow-up MRI or CT abdomen with IV contrast suggested. 2. New wall thickening throughout the hepatic flexure of the colon, probably reactive. 3. Cholecystectomy. No intrahepatic biliary ductal dilatation. CBD diameter 7 mm, within normal post cholecystectomy limits. 4. New small volume perihepatic ascites. 5.  Aortic Atherosclerosis (ICD10-I70.0). Electronically Signed   By: Ilona Sorrel M.D.   On: 05/28/2018 20:41   Dg Chest Portable 1 View  Result Date: 05/28/2018 CLINICAL DATA:  Severe right lower quadrant pain. EXAM: PORTABLE CHEST 1 VIEW COMPARISON:  04/18/2018 FINDINGS: The lungs are clear without focal pneumonia, edema, pneumothorax or pleural effusion. Interstitial markings are diffusely coarsened with chronic features. Tiny nodular density identified right upper lung, superimposed on anterior right second rib. Patient is status post median sternotomy. Left atrial appendage  occluded device noted. Vascular stent superimposed on the transverse aorta. The visualized bony structures of the thorax are intact. IMPRESSION: Cardiomegaly with mild vascular congestion or chronic interstitial coarsening. Tiny nodule right upper lung not visible on prior studies and indeterminate. CT chest without contrast could be used to further evaluate. Electronically Signed   By: Misty Stanley M.D.   On: 05/28/2018 17:23    Procedures Procedures (including critical care time)  CRITICAL CARE Performed by: Rodney Booze   Total critical care time: 37 minutes  Critical care time was exclusive of separately billable procedures and treating other patients.  Critical care was necessary to treat or prevent imminent or life-threatening deterioration.  Critical care was time spent personally by me on the following activities: development of treatment plan with patient and/or surrogate as well as nursing, discussions with consultants, evaluation of patient's response to treatment, examination of patient, obtaining history from patient or surrogate, ordering and performing treatments and interventions, ordering and review of laboratory studies, ordering and review of radiographic studies, pulse oximetry and re-evaluation of patient's condition.   Medications Ordered in ED Medications  potassium chloride 10 mEq in 100 mL IVPB ( Intravenous Restarted 05/28/18 2015)  isosorbide mononitrate (IMDUR) 24 hr tablet 15 mg (has no administration in time range)  ipratropium-albuterol (DUONEB) 0.5-2.5 (3) MG/3ML nebulizer solution 3 mL (3 mLs Nebulization Given 05/28/18 1825)  ondansetron (ZOFRAN) injection 4 mg (4 mg Intravenous Given 05/28/18 1815)  HYDROmorphone (DILAUDID) injection 0.5 mg (0.5 mg Intravenous Given 05/28/18 1818)  hydrALAZINE (APRESOLINE) injection 10 mg (10 mg Intravenous Given 05/28/18 1816)  aspirin chewable tablet 324 mg (324 mg Oral Given 05/28/18 1844)  HYDROmorphone (DILAUDID)  injection 1 mg (1 mg Intravenous Given 05/28/18 2004)  hydrALAZINE (APRESOLINE) tablet 100 mg (  100 mg Oral Given 05/28/18 2004)  amLODipine (NORVASC) tablet 10 mg (10 mg Oral Given 05/28/18 2005)     Initial Impression / Assessment and Plan / ED Course  I have reviewed the triage vital signs and the nursing notes.  Pertinent labs & imaging results that were available during my care of the patient were reviewed by me and considered in my medical decision making (see chart for details).  Discussed pt presentation and exam findings with Dr. Sedonia Small, who personally evaluated the pt. He agrees with workup. He reviewed workup and agrees with plan for admission.    8:42 PM contacted lab regarding lipase still pending. States on chart that it has been collected. Labs states they have not received this lab. I requested that it be added on.  Final Clinical Impressions(s) / ED Diagnoses   Final diagnoses:  Acute pancreatitis, unspecified complication status, unspecified pancreatitis type  NSTEMI (non-ST elevated myocardial infarction) ([place])  Hypokalemia   Pt with abd pain, NVD s/p AAA repair, chole, appe. Tachycardic and HTN. Likely HTN secondary to pain and missed doses of BP medications. No fevers. Otherwise vitals WNL.  CBC with leukocytosis to 23.7, anemia improving. Lipase 399 consistent with pancreatitis.  CMP with hypokalemia to 3.1, supplemented with IV K. Some evidence of QT prolongation on ECG. Total bilirubin elevated 1.7, otherwise CMP appears baseline. Troponin is elevated to 0.13, likely related to pancreatitis. Denies current CP or SOB. Type II Nstemi. Lactic acid negative.  Blood cultures and UA pending.   CXR with small nodule to right upper lung. Ct recommended. cardiomegaly with vascular congestion also noted, likely chronic.  CT of abdomen without contrast shows evidence of worsening pancreatitis. poorly defined fluid collection in anterior pancreatic head and new complex fluid  collection inferior to gallbladder suggest worsening pseudocysts. No intrahepatic ductal dilatation or widening of CBD.  Ct chest without lung masses or significant pulmonary nodules. Mild pulmonary edema with likely pulmonary arterial HTN.  Will plan for admission to further treat worsening pancreatitis with pseudocysts and Type II NSTEMI.  ED Discharge Orders    None       Bishop Dublin 05/28/18 2130    Maudie Flakes, MD 05/28/18 2316

## 2018-05-28 NOTE — Progress Notes (Signed)
Pharmacy Antibiotic Note  [name] is a 67 y.o. female admitted on 05/28/2018 with sepsis.  Pharmacy has been consulted for zosyn dosing.  [name] has a very complicated hx with pancreatitis/AAA. She presented with RLQ pain. Her WBC 23.7 and LA 1.12. Scr 1.8. Zosyn has been ordered empirically.   Plan: Zosyn 3.375g IV q8     Temp (24hrs), Avg:97.9 F (36.6 C), Min:97.9 F (36.6 C), Max:97.9 F (36.6 C)  Recent Labs  Lab 05/28/18 1507 05/28/18 1809  WBC 23.7*  --   CREATININE 1.80*  --   LATICACIDVEN  --  1.12    Estimated Creatinine Clearance: 27.4 mL/min (A) (by C-G formula based on SCr of 1.8 mg/dL (H)).    Allergies  Allergen Reactions  . Isosorbide Other (See Comments)    Can only tolerate in low doses  . Isosorbide Nitrate Other (See Comments)    Can only tolerate in low doses  . Oxycodone-Acetaminophen Itching  . Codeine Itching  . Contrast Media [Iodinated Diagnostic Agents] Itching and Other (See Comments)    Itching of feet  . Ioxaglate Itching and Other (See Comments)    Itching of feet  . Metrizamide Itching and Other (See Comments)    Itching of feet  . Percocet [Oxycodone-Acetaminophen] Itching and Other (See Comments)    Tolerates Hydrocodone    Antimicrobials this admission: 9/22 zosyn >>    Dose adjustments this admission:   Microbiology results: 9/22 BCx: UCx:   Sputum:  MRSA PCR:   Onnie Boer, PharmD, BCIDP, AAHIVP, CPP Infectious Disease Pharmacist Pager: [phone] 05/28/2018 10:25 PM

## 2018-05-28 NOTE — H&P (Signed)
History and Physical    [name] TJQ:[id number] DOB: 1951-04-28 DOA: 05/28/2018  Referring MD/NP/PA:   PCP: Bernerd Limbo, MD   Patient coming from:  The patient is coming from home.  At baseline, pt is independent for most of ADL.   Chief Complaint: Abdominal pain  HPI: [name] is a 67 y.o. female with medical history significant of hypertension, hyperlipidemia, diet-controlled diabetes, COPD on home oxygen, GERD, gout, depression with anxiety, tobacco abuse, atrial fibrillation not on anticoagulants, AAA, CAD, CABG, CHF, CKD-4, GI bleeding, mitral valve regurgitation, PAD, who presents with abdominal pain.  Patient was recently hospitalized from 8/4-8/16 due to GI bleeding required blood transfusion and gallstone pancreatitis.  Patient underwent laparoscopic cholecystectomy.  She was discharged in stable condition.  Patient states that since yesterday, she has been having abdominal pain, which is located right side of abdomen, constant, 8 out of 10 severity, sharp, nonradiating.  It is not aggravated or alleviated by any known factors.  It is associated with nausea and vomited once.  Patient also reports diarrhea, but she is on Colace.  Patient denies fever or chills.  Patient states that she has intermittent mild chest pain recently, but currently does not have chest pain.  She has some mild cough and shortness of breath, which is chronic issue due to COPD, no significant change recently.  Denies symptoms of UTI or unilateral weakness.  Patient states that she had fall approximately 6 weeks ago, and has back bruits.   ED Course: pt was found to have lipase of 399, troponin 0 0.13, potassium 3.1, stable renal function, WBC 23.7, temperature normal, tachycardia, no tachypnea, oxygen saturation 90 to 98% on 2 L nasal cannula oxygen, elevated blood pressure 211/107.  Chest x-ray showed cardiomegaly and vascular congestion and a tiny nodule right upper lung. CT-chest did not show any mass or  significant nodule. Patient is admitted to telemetry bed as inpatient.  CT abdomen/pelvis showed: 1. Worsening severe inflammatory changes surrounding the pancreatic head and neck, descending duodenum and hepatic flexure of the colon. Enlarging nonspecific complex poorly defined fluid collection in the anterior pancreatic head. New complex fluid collection inferior to the gallbladder fossa with layering hyperdense material. Burtis Junes worsening pancreatitis with enlarging pancreatic pseudocysts. Marked wall thickening in the descending duodenum has worsened, and could be reactive, with peptic ulcer disease difficult to exclude. No free air.  2. New wall thickening throughout the hepatic flexure of the colon, probably reactive. 3. Cholecystectomy. No intrahepatic biliary ductal dilatation. CBD diameter 7 mm, within normal post cholecystectomy limits. 4. New small volume perihepatic ascites. 5. Aortic Atherosclerosis (ICD10-I70.0  Review of Systems:   General: no fevers, chills, no body weight gain, has poor appetite, has fatigue HEENT: no blurry vision, hearing changes or sore throat Respiratory: has dyspnea, coughing, no wheezing CV: no chest pain, no palpitations GI: has nausea, vomiting, abdominal pain, diarrhea, no constipation GU: no dysuria, burning on urination, increased urinary frequency, hematuria  Ext: has mild leg edema Neuro: no unilateral weakness, numbness, or tingling, no vision change or hearing loss Skin: no rash, no skin tear. MSK: No muscle spasm, no deformity, no limitation of range of movement in spin Heme: No easy bruising.  Travel history: No recent long distant travel.  Allergy:    Past Medical History:  Diagnosis Date  . AAA (abdominal aortic aneurysm) ([place]) 5/09   3.7x3.3 by u/s 2009  . Abnormal EKG    deep TW inversions chronic  . Anemia   .  CAD (coronary artery disease)    a. CABG 03/2011 LIMA to LAD, SVG to OM, SVG to PDA. b. cath 06/25/2015 DES to SVG to  rPDA, patent LIMA to LAD, patent SVG to OM  . carotid stenosis 5/09   S/p L CEA ;  60-79% bilat ICA by preCABG dopplers 7/12  . CHF (congestive heart failure) ([place])   . Chronic back pain    "all over" (06/24/2015)  . Chronic bronchitis ([place])    "get it pretty much q yr" (06/24/2015)  . CKD (chronic kidney disease)   . Complication of anesthesia 1966   "problem w/ether"  . COPD (chronic obstructive pulmonary disease) ([place])   . Coronary artery disease 5/09   s/p CABG 7/12   L-LAD, S-OM, S-PDA  . Depression   . GERD (gastroesophageal reflux disease)    With hiatal hernia  . GIB (gastrointestinal bleeding) 9/11   S/p EGD with cautery at [facility]  . Heart murmur   . History of blood transfusion "a few times"   "all related to anemia" (06/24/2015)  . History of hiatal hernia   . Hyperlipidemia   . Hypertension   . Mitral regurgitation    3+ MR by intraoperative TEE;  s/p MV repair with Dr. Roxy Manns 7/12  . Myocardial infarction [facility]) 2012 "several"  . On home oxygen therapy    "2 liters at night; negative for sleep apnea"  . PAD (peripheral artery disease) ([facility])    Severe; s/p bilateral renal artery stents, moderate in-stent restenosis  . Paroxysmal atrial fibrillation ([facility])    coumadin;  echo 9/07: EF 60%, mild LVH;  s/p Cox Maze 7/12 with LAA clipping  . Subclavian artery stenosis, left ([facility])    stented by Dr. Trula Slade on 10/18 to help flow of her LIMA to LAD  . Type II diabetes mellitus ([place])     Past Surgical History:  Procedure Laterality Date  . A/V FISTULAGRAM Left 03/21/2018   Procedure: A/V FISTULAGRAM;  Surgeon: Serafina Mitchell, MD;  Location: [place] [facility];  Service: Cardiovascular;  Laterality: Left;  . ABDOMINAL AORTIC ENDOVASCULAR STENT GRAFT N/A 01/17/2018   Procedure: ABDOMINAL AORTIC ENDOVASCULAR STENT GRAFT WITH CO2;  Surgeon: Serafina Mitchell, MD;  Location: [place];  Service: Vascular;  Laterality: N/A;  . ABDOMINAL HYSTERECTOMY  1990's  . APPENDECTOMY  Aug. 11,  2016   Ruptured  . AV FISTULA PLACEMENT Left 11/03/2017   Procedure: ARTERIOVENOUS (AV) FISTULA CREATION LEFT ARM;  Surgeon: Serafina Mitchell, MD;  Location: [place];  Service: Vascular;  Laterality: Left;  . BOWEL RESECTION  2013  . CARDIAC CATHETERIZATION N/A 06/24/2015   Procedure: Left Heart Cath and Cors/Grafts Angiography;  Surgeon: Leonie Man, MD;  Location: [place] [facility];  Service: Cardiovascular;  Laterality: N/A;  . CARDIAC CATHETERIZATION N/A 06/25/2015   Procedure: Coronary Stent Intervention;  Surgeon: Sherren Mocha, MD;  Location: [place] [facility];  Service: Cardiovascular;  Laterality: N/A;  . CAROTID ENDARTERECTOMY Left   . CATARACT EXTRACTION Right 03/27/2018  . CHOLECYSTECTOMY N/A 04/19/2018   Procedure: LAPAROSCOPIC CHOLECYSTECTOMY WITH INTRAOPERATIVE CHOLANGIOGRAM;  Surgeon: Rolm Bookbinder, MD;  Location: [place];  Service: General;  Laterality: N/A;  . CORONARY ANGIOPLASTY    . CORONARY ARTERY BYPASS GRAFT  03/05/2011   CABG X3 (LIMA to LAD, SVG to OM, SVG to PDA, EVH via left thigh  . CORONARY STENT PLACEMENT    . ESOPHAGOGASTRODUODENOSCOPY N/A 04/12/2018   Procedure: ESOPHAGOGASTRODUODENOSCOPY (EGD);  Surgeon: [name],  MD;  Location: [facility];  Service: Endoscopy;  Laterality: N/A;  . LACERATION REPAIR Right 1990's   WRIST  . MAZE  03/05/2011   complete biatrial lesion set with clipping of LA appendage  . MITRAL VALVE REPAIR  03/05/2011   63mm Memo 3D ring annuloplasty for ischemic MR  . PERIPHERAL VASCULAR BALLOON ANGIOPLASTY Left 03/21/2018   Procedure: PERIPHERAL VASCULAR BALLOON ANGIOPLASTY;  Surgeon: Serafina Mitchell, MD;  Location: [place] [facility];  Service: Cardiovascular;  Laterality: Left;  . PERIPHERAL VASCULAR CATHETERIZATION N/A 06/24/2015   Procedure: Aortic Arch Angiography;  Surgeon: Serafina Mitchell, MD;  Location: [facility] [place] [facility];  Service: Cardiovascular;  Laterality: N/A;  . PERIPHERAL VASCULAR CATHETERIZATION  06/24/2015     Procedure: Peripheral Vascular Intervention;  Surgeon: Serafina Mitchell, MD;  Location: [place] [facility];  Service: Cardiovascular;;  . PERIPHERAL VASCULAR CATHETERIZATION N/A 06/24/2015   Procedure: Abdominal Aortogram;  Surgeon: Serafina Mitchell, MD;  Location: [place] [facility];  Service: Cardiovascular;  Laterality: N/A;  . RENAL ANGIOGRAM Bilateral 09/11/2013   Procedure: RENAL ANGIOGRAM;  Surgeon: Serafina Mitchell, MD;  Location: [facility];  Service: Cardiovascular;  Laterality: Bilateral;  . RIGHT FEMORAL-POPLITEAL BYPASS      Social History:  reports that she has been smoking cigarettes. She has smoked for the past 50.00 years. She has never used smokeless tobacco. She reports that she drinks alcohol. She reports that she has current or past drug history. Drug: Marijuana.  Family History:  Family History  Problem Relation Age of Onset  . Emphysema Mother   . Heart disease Mother        before age 37  . Hypertension Mother   . Hyperlipidemia Mother   . Heart attack Mother   . AAA (abdominal aortic aneurysm) Mother        rupture  . Heart attack Father 32  . Emphysema Father   . Heart disease Father        before age 68  . Hyperlipidemia Father   . Hypertension Father   . Peripheral vascular disease Father   . Diabetes Brother   . Heart disease Brother        before age 19  . Hyperlipidemia Brother   . Hypertension Brother   . Heart attack Brother        CABG  . Heart disease Other        Vascular disease in grandparents, uncles and dad  . Colon cancer Neg Hx      Prior to Admission medications   Medication Sig Start Date End Date Taking? Authorizing Provider  acetaminophen (TYLENOL) 325 MG tablet Take 325 mg by mouth every 6 (six) hours as needed (for pain).    [provider]  albuterol (PROVENTIL HFA;VENTOLIN HFA) 108 (90 Base) MCG/ACT inhaler Inhale 2 puffs into the lungs every 4 (four) hours as needed for wheezing or shortness of breath.  ProAir Patient taking differently: Inhale 2 puffs into the lungs every 4 (four) hours as needed for wheezing or shortness of breath.  10/08/15   Delfina Redwood, MD  allopurinol (ZYLOPRIM) 100 MG tablet Take 100 mg by mouth daily. 02/02/17   [provider]  ALPRAZolam Duanne Moron) 0.5 MG tablet Take 0.5 mg by mouth at bedtime.     [provider]  amLODipine (NORVASC) 10 MG tablet Take 10 mg by mouth daily.      [provider]  atorvastatin (LIPITOR) 40 MG tablet Take  40 mg by mouth every evening.  12/07/17   [provider]  budesonide-formoterol (SYMBICORT) 160-4.5 MCG/ACT inhaler Inhale 2 puffs into the lungs 2 (two) times daily as needed (for flares).    [provider]  carvedilol (COREG) 25 MG tablet TAKE 1 TABLET BY MOUTH 2 TIMES A DAY WITH A MEAL Patient taking differently: Take 25 mg by mouth 2 (two) times daily with a meal.  11/04/15   Burtis Junes, NP  Cholecalciferol (VITAMIN D3) 5000 units TABS Take 5,000 Units by mouth daily.    [provider]  cloNIDine (CATAPRES) 0.2 MG tablet Take 1 tablet (0.2 mg total) by mouth 3 (three) times daily. 07/10/15   Richardson Dopp T, PA-C  clopidogrel (PLAVIX) 75 MG tablet Take 1 tablet (75 mg total) by mouth daily with breakfast. 06/26/15   Almyra Deforest, PA  docusate sodium (COLACE) 100 MG capsule Take 200 mg by mouth daily as needed (when taking pain medications).     [provider]  DULoxetine (CYMBALTA) 60 MG capsule Take 60 mg by mouth daily.  10/26/17   [provider]  famotidine (PEPCID) 20 MG tablet TAKE 1 TABLET (20 MG TOTAL) BY MOUTH DAILY BEFORE SUPPER. Patient taking differently: Take 20 mg by mouth daily before supper.  01/27/16   Sherren Mocha, MD  fenofibrate 160 MG tablet Take 1 tablet (160 mg total) by mouth daily. Patient taking differently: Take 160 mg by mouth at bedtime.  10/08/15   Delfina Redwood, MD  ferrous sulfate 325 (65 FE) MG tablet Take 325 mg by mouth  2 (two) times daily with a meal.    [provider]  furosemide (LASIX) 40 MG tablet Take 40 mg by mouth See admin instructions. Take 40 mg by mouth once a day and may take an additional 40 mg day as needed for weight gain 05/30/17   [provider]  gabapentin (NEURONTIN) 300 MG capsule TAKE 1 CAPSULE(300 MG) BY MOUTH TWICE DAILY Patient taking differently: Take 300 mg by mouth 2 (two) times daily.  05/05/18   Serafina Mitchell, MD  hydrALAZINE (APRESOLINE) 100 MG tablet Take 100 mg by mouth 3 (three) times daily.    [provider]  HYDROcodone-acetaminophen (NORCO/VICODIN) 5-325 MG tablet Take 1 tablet by mouth every 6 (six) hours as needed for moderate pain. 04/20/18   Rolm Bookbinder, MD  isosorbide mononitrate (IMDUR) 30 MG 24 hr tablet Take 0.5 tablets (15 mg total) by mouth daily. 12/02/15   Sherren Mocha, MD  mirtazapine (REMERON) 7.5 MG tablet Take 7.5 mg by mouth at bedtime.  02/15/18 08/14/18  [provider]  pantoprazole (PROTONIX) 40 MG tablet Take 1 tablet (40 mg total) by mouth daily. 06/26/15   Almyra Deforest, PA  Potassium 99 MG TABS Take 99 mg by mouth daily.     [provider]  promethazine (PHENERGAN) 25 MG tablet Take 1 tablet (25 mg total) by mouth every 6 (six) hours as needed for nausea or vomiting. 05/16/18   Ward, Delice Bison, DO    Physical Exam: Vitals:   05/28/18 2215 05/28/18 2233 05/28/18 2300 05/29/18 0452  BP: (!) 184/70 (!) 167/69 (!) 172/76 (!) 143/51  Pulse: (!) 104 (!) 108 (!) 108 86  Resp: 12 (!) 22 18 18   Temp:   98.4 F (36.9 C) 98.3 F (36.8 C)  TempSrc:   Oral Oral  SpO2: 93% (!) 89% 93% 98%   General: Not in acute distress HEENT:  Eyes: PERRL, EOMI, no scleral icterus.       ENT: No discharge from the ears and nose, no pharynx injection, no tonsillar enlargement.        Neck: No JVD, no bruit, no mass felt. Heme: No neck lymph node enlargement. Cardiac: S1/S2, RRR, No murmurs, No gallops or  rubs. Respiratory: No rales, wheezing, rhonchi or rubs. GI: Soft, nondistended, has tenderness in right side of abdomen, no rebound pain, no organomegaly, BS present. GU: No hematuria Ext: has trace leg edema bilaterally. 2+DP/PT pulse bilaterally. Musculoskeletal: No joint deformities, No joint redness or warmth, no limitation of ROM in spin. Skin: No rashes.  Neuro: Alert, oriented X3, cranial nerves II-XII grossly intact, moves all extremities normally. Psych: Patient is not psychotic, no suicidal or hemocidal ideation.  Labs on Admission: I have personally reviewed following labs and imaging studies  CBC: Recent Labs  Lab 05/28/18 1507 05/29/18 0335  WBC 23.7* 14.5*  HGB 11.0* 8.4*  HCT 34.2* 26.6*  MCV 97.7 98.2  PLT 339 952   Basic Metabolic Panel: Recent Labs  Lab 05/28/18 1507 05/29/18 0335  NA 141 142  K 3.1* 3.0*  CL 97* 101  CO2 27 28  GLUCOSE 178* 102*  BUN 27* 31*  CREATININE 1.80* 1.91*  CALCIUM 8.1* 7.4*  MG  --  1.3*   GFR: Estimated Creatinine Clearance: 25.9 mL/min (A) (by C-G formula based on SCr of 1.91 mg/dL (H)). Liver Function Tests: Recent Labs  Lab 05/28/18 1507  AST 18  ALT 8  ALKPHOS 48  BILITOT 1.7*  PROT 5.2*  ALBUMIN 2.4*   Recent Labs  Lab 05/28/18 1507 05/29/18 0335  LIPASE 399* 667*   No results for input(s): AMMONIA in the last 168 hours. Coagulation Profile: Recent Labs  Lab 05/28/18 2325  INR 1.13   Cardiac Enzymes: Recent Labs  Lab 05/28/18 2325 05/29/18 0335  TROPONINI 0.16* 0.35*   BNP (last 3 results) No results for input(s): PROBNP in the last 8760 hours. HbA1C: Recent Labs    05/29/18 0335  HGBA1C 5.3   CBG: No results for input(s): GLUCAP in the last 168 hours. Lipid Profile: Recent Labs    05/29/18 0335  CHOL 85  HDL 20*  LDLCALC 38  TRIG 135  CHOLHDL 4.3   Thyroid Function Tests: No results for input(s): TSH, T4TOTAL, FREET4, T3FREE, THYROIDAB in the last 72 hours. Anemia  Panel: No results for input(s): VITAMINB12, FOLATE, FERRITIN, TIBC, IRON, RETICCTPCT in the last 72 hours. Urine analysis:    Component Value Date/Time   COLORURINE AMBER (A) 05/16/2018 0533   APPEARANCEUR CLEAR 05/16/2018 0533   LABSPEC 1.019 05/16/2018 0533   PHURINE 5.0 05/16/2018 0533   GLUCOSEU NEGATIVE 05/16/2018 0533   HGBUR NEGATIVE 05/16/2018 0533   BILIRUBINUR NEGATIVE 05/16/2018 0533   KETONESUR NEGATIVE 05/16/2018 0533   PROTEINUR 100 (A) 05/16/2018 0533   UROBILINOGEN 0.2 03/04/2011 2107   NITRITE NEGATIVE 05/16/2018 0533   LEUKOCYTESUR NEGATIVE 05/16/2018 0533   Sepsis Labs: @LABRCNTIP (procalcitonin:4,lacticidven:4) )No results found for this or any previous visit (from the past 240 hour(s)).   Radiological Exams on Admission: Ct Abdomen Pelvis Wo Contrast  Result Date: 05/28/2018 CLINICAL DATA:  New tiny right lung nodule on chest radiograph. Severe right lower quadrant abdominal pain with nausea, vomiting and diarrhea. Chronic kidney disease. Prior cholecystectomy 04/19/2018, hysterectomy and appendectomy. Prior bowel resection. EXAM: CT CHEST, ABDOMEN AND PELVIS WITHOUT CONTRAST TECHNIQUE: Multidetector CT imaging of the chest, abdomen and pelvis was performed following  the standard protocol without IV contrast. COMPARISON:  Chest radiograph from earlier today. FINDINGS: CT CHEST FINDINGS Cardiovascular: Borderline mild cardiomegaly. No significant pericardial effusion/thickening. 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 Atherosclerotic nonaneurysmal thoracic aorta. Proximal left subclavian artery stent is noted. Dilated main pulmonary artery (4.0 cm diameter). Mediastinum/Nodes: No discrete thyroid nodules. Unremarkable esophagus. No axillary adenopathy. Mild right paratracheal adenopathy up to 1.1 cm (series 3/image 21). Mildly enlarged 1.1 cm subcarinal node (series 3/image 26). No discrete hilar adenopathy on this noncontrast scan. Lungs/Pleura:  No pneumothorax. No pleural effusion. A few tiny calcified granulomas are present in the upper lobes bilaterally. No acute consolidative airspace disease, lung masses or significant pulmonary nodules. Diffuse interlobular septal thickening. Musculoskeletal: No aggressive appearing focal osseous lesions. Intact sternotomy wires. Moderate thoracic spondylosis. CT ABDOMEN PELVIS FINDINGS Hepatobiliary: Normal liver with no liver mass. Cholecystectomy. Bile ducts are within normal post cholecystectomy limits with CBD diameter 7 mm. Pancreas: Thickening of the pancreatic head and neck. Prominent peripancreatic fat stranding and ill-defined fluid in the pancreatic head and neck, worsened. Poorly defined 5.1 x 2.5 cm anterior pancreatic head complex fluid collection with heterogeneous internal density (series 3/image 66), increased from 2.3 x 1.2 cm. No definite pancreatic duct dilation. Severe wall thickening throughout the adjacent descending duodenum, worsened. Fluid collection inferior to the gallbladder fossa measures 5.0 x 3.0 cm (series 3/image 71) and demonstrates layering hyperdense material, new. Spleen: Normal size. No mass. Adrenals/Urinary Tract: Normal adrenals. No hydronephrosis. No renal stones. Partially exophytic simple 1.3 cm posterior interpolar left renal cyst. No additional contour deforming renal lesions. Normal bladder. Stomach/Bowel: Normal non-distended stomach. Normal caliber small bowel with no small bowel wall thickening. Appendectomy. New segmental wall thickening in the hepatic flexure of the colon. No additional sites of large bowel wall thickening. Stable postsurgical changes from distal large bowel resection. Vascular/Lymphatic: Atherosclerotic abdominal aorta with stable 5.0 cm infrarenal abdominal aortic aneurysm status post aorto bi-iliac stent graft repair. Surgical clips are noted in the right inguinal region. No pathologically enlarged lymph nodes in the abdomen or pelvis.  Reproductive: Status post hysterectomy, with no abnormal findings at the vaginal cuff. No adnexal mass. Other: No pneumoperitoneum.  New small volume perihepatic ascites. Musculoskeletal: No aggressive appearing focal osseous lesions. Mild lumbar spondylosis. IMPRESSION: CT CHEST: 1. No lung masses or significant pulmonary nodules. 2. Borderline mild cardiomegaly. Diffuse interlobular septal thickening suggests mild pulmonary edema. No pleural effusions. 3. Dilated main pulmonary artery, suggesting pulmonary arterial hypertension. CT ABDOMEN PELVIS: 1. Worsening severe inflammatory changes surrounding the pancreatic head and neck, descending duodenum and hepatic flexure of the colon. Enlarging nonspecific complex poorly defined fluid collection in the anterior pancreatic head. New complex fluid collection inferior to the gallbladder fossa with layering hyperdense material. Burtis Junes worsening pancreatitis with enlarging pancreatic pseudocysts. Marked wall thickening in the descending duodenum has worsened, and could be reactive, with peptic ulcer disease difficult to exclude. No free air. Short-term follow-up MRI or CT abdomen with IV contrast suggested. 2. New wall thickening throughout the hepatic flexure of the colon, probably reactive. 3. Cholecystectomy. No intrahepatic biliary ductal dilatation. CBD diameter 7 mm, within normal post cholecystectomy limits. 4. New small volume perihepatic ascites. 5.  Aortic Atherosclerosis (ICD10-I70.0). Electronically Signed   By: Ilona Sorrel M.D.   On: 05/28/2018 20:41   Ct Chest Wo Contrast  Result Date: 05/28/2018 CLINICAL DATA:  New tiny right lung nodule on chest radiograph. Severe right lower quadrant abdominal pain with nausea, vomiting and diarrhea.  Chronic kidney disease. Prior cholecystectomy 04/19/2018, hysterectomy and appendectomy. Prior bowel resection. EXAM: CT CHEST, ABDOMEN AND PELVIS WITHOUT CONTRAST TECHNIQUE: Multidetector CT imaging of the chest, abdomen  and pelvis was performed following the standard protocol without IV contrast. COMPARISON:  Chest radiograph from earlier today. FINDINGS: CT CHEST FINDINGS Cardiovascular: Borderline mild cardiomegaly. No significant pericardial effusion/thickening. 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 Atherosclerotic nonaneurysmal thoracic aorta. Proximal left subclavian artery stent is noted. Dilated main pulmonary artery (4.0 cm diameter). Mediastinum/Nodes: No discrete thyroid nodules. Unremarkable esophagus. No axillary adenopathy. Mild right paratracheal adenopathy up to 1.1 cm (series 3/image 21). Mildly enlarged 1.1 cm subcarinal node (series 3/image 26). No discrete hilar adenopathy on this noncontrast scan. Lungs/Pleura: No pneumothorax. No pleural effusion. A few tiny calcified granulomas are present in the upper lobes bilaterally. No acute consolidative airspace disease, lung masses or significant pulmonary nodules. Diffuse interlobular septal thickening. Musculoskeletal: No aggressive appearing focal osseous lesions. Intact sternotomy wires. Moderate thoracic spondylosis. CT ABDOMEN PELVIS FINDINGS Hepatobiliary: Normal liver with no liver mass. Cholecystectomy. Bile ducts are within normal post cholecystectomy limits with CBD diameter 7 mm. Pancreas: Thickening of the pancreatic head and neck. Prominent peripancreatic fat stranding and ill-defined fluid in the pancreatic head and neck, worsened. Poorly defined 5.1 x 2.5 cm anterior pancreatic head complex fluid collection with heterogeneous internal density (series 3/image 66), increased from 2.3 x 1.2 cm. No definite pancreatic duct dilation. Severe wall thickening throughout the adjacent descending duodenum, worsened. Fluid collection inferior to the gallbladder fossa measures 5.0 x 3.0 cm (series 3/image 71) and demonstrates layering hyperdense material, new. Spleen: Normal size. No mass. Adrenals/Urinary Tract: Normal adrenals. No  hydronephrosis. No renal stones. Partially exophytic simple 1.3 cm posterior interpolar left renal cyst. No additional contour deforming renal lesions. Normal bladder. Stomach/Bowel: Normal non-distended stomach. Normal caliber small bowel with no small bowel wall thickening. Appendectomy. New segmental wall thickening in the hepatic flexure of the colon. No additional sites of large bowel wall thickening. Stable postsurgical changes from distal large bowel resection. Vascular/Lymphatic: Atherosclerotic abdominal aorta with stable 5.0 cm infrarenal abdominal aortic aneurysm status post aorto bi-iliac stent graft repair. Surgical clips are noted in the right inguinal region. No pathologically enlarged lymph nodes in the abdomen or pelvis. Reproductive: Status post hysterectomy, with no abnormal findings at the vaginal cuff. No adnexal mass. Other: No pneumoperitoneum.  New small volume perihepatic ascites. Musculoskeletal: No aggressive appearing focal osseous lesions. Mild lumbar spondylosis. IMPRESSION: CT CHEST: 1. No lung masses or significant pulmonary nodules. 2. Borderline mild cardiomegaly. Diffuse interlobular septal thickening suggests mild pulmonary edema. No pleural effusions. 3. Dilated main pulmonary artery, suggesting pulmonary arterial hypertension. CT ABDOMEN PELVIS: 1. Worsening severe inflammatory changes surrounding the pancreatic head and neck, descending duodenum and hepatic flexure of the colon. Enlarging nonspecific complex poorly defined fluid collection in the anterior pancreatic head. New complex fluid collection inferior to the gallbladder fossa with layering hyperdense material. Burtis Junes worsening pancreatitis with enlarging pancreatic pseudocysts. Marked wall thickening in the descending duodenum has worsened, and could be reactive, with peptic ulcer disease difficult to exclude. No free air. Short-term follow-up MRI or CT abdomen with IV contrast suggested. 2. New wall thickening  throughout the hepatic flexure of the colon, probably reactive. 3. Cholecystectomy. No intrahepatic biliary ductal dilatation. CBD diameter 7 mm, within normal post cholecystectomy limits. 4. New small volume perihepatic ascites. 5.  Aortic Atherosclerosis (ICD10-I70.0). Electronically Signed   By: Ilona Sorrel M.D.   On:  05/28/2018 20:41   Dg Chest Portable 1 View  Result Date: 05/28/2018 CLINICAL DATA:  Severe right lower quadrant pain. EXAM: PORTABLE CHEST 1 VIEW COMPARISON:  04/18/2018 FINDINGS: The lungs are clear without focal pneumonia, edema, pneumothorax or pleural effusion. Interstitial markings are diffusely coarsened with chronic features. Tiny nodular density identified right upper lung, superimposed on anterior right second rib. Patient is status post median sternotomy. Left atrial appendage occluded device noted. Vascular stent superimposed on the transverse aorta. The visualized bony structures of the thorax are intact. IMPRESSION: Cardiomegaly with mild vascular congestion or chronic interstitial coarsening. Tiny nodule right upper lung not visible on prior studies and indeterminate. CT chest without contrast could be used to further evaluate. Electronically Signed   By: Misty Stanley M.D.   On: 05/28/2018 17:23     EKG: Independently reviewed.  Not done in ED, will get one.   Assessment/Plan Principal Problem:   Pancreatitis, recurrent Active Problems:   TOBACCO ABUSE   Essential hypertension   Atrial fibrillation (HCC)   COPD (chronic obstructive pulmonary disease) (HCC)   S/P CABG x 3   Anemia   GERD (gastroesophageal reflux disease)   Hyperlipidemia   CAD (coronary artery disease)   Chronic kidney disease (CKD), stage IV (severe) (HCC)   Chronic diastolic congestive heart failure (HCC)   Type II diabetes mellitus with renal manifestations (HCC)   Elevated troponin   Hypertensive urgency   Hypokalemia   SIRS (systemic inflammatory response syndrome)  (HCC)  Pancreatitis, recurrent: pt's abdominal pain is most likely caused by recurrent pancreatitis.  Lipase 399.  CT scan showed worsening inflammation change and possible pseudocyst. Pt does not have fever, but has significant leukocytosis with WBC 23.7. Will start Abx empirically.  -will admit to tele bed as inpt -PRN Dilaudid for pain -PRN hydroxyzine for nausea (patient has QT prolongation, cannot use Zofran) -Gentle IV fluid due to history of CHF, 75 cc/h of normal saline -Start Zosyn empirically -f/u blood culture  Elevated troponin and hx of CAD: s/p of CABG. Trop 0.12-->0.16-->0.35.  Patient states that she has been having intermittent mild chest pain recently, but no chest pain currently.  Possibly due to demand ischemia. - cycle CE q6 x3 and repeat EKG in the am  - prn dilaudid - lipitor, imdur, coreg - pt is on plavix. Will not start ASA due to recent GIB - Risk factor stratification: will check FLP and A1C  - inpt card consult was requested via Epic  TOBACCO ABUSE: -nicotine patch  Essential hypertension and hypertensive urgency: Blood pressure 121/107, 186/74 -IV hydralazine as needed -Amlodipine, Coreg, clonidine, hydralazine,  Atrial Fibrillation: CHA2DS2-VASc Score is 6, needs oral anticoagulation, but pt is not on AC, likely due to GIB. Heart rate is 100s. -continue Coreg  COPD (chronic obstructive pulmonary disease) ([place]): Stable. -Bronchodilators  Anemia: Hemoglobin 9.1 on 05/15/2018--> 11.0 on admission-->8.4 in AM. -check FOBT -f/u by CBC  GERD: -Protonix  Hyperlipidemia: -Lipitor and fenofibrate  Chronic kidney disease (CKD), stage IV (severe) ([place]): Stable.  Baseline creatinine ~2.0.  Her creatinine is 1.80, BUN 27. -Follow-up renal function by BMP  Chronic diastolic congestive heart failure ([place]): 2D echo on 03/31/2017 showed EF of 55 to 60% with grade 3 diastolic dysfunction.  Patient has mild leg edema, but no acute respiratory distress.  Chest  x-ray showed cardiomegaly and vascular congestion.  Patient has mild fluid overload. Due to recurrent pancreatitis, will hold Lasix - Check BNP  Diet controled Type II diabetes mellitus with  renal manifestations ([place]): Last A1c 5.2 on 01/22/18, well controled. Patient is not taking meds at home. CBG 178 -check CBG qAM  Hypokalemia: K=3.1 on admission. - Repleted - Check Mg level  SIRS vs. Sepsis : Patient meets criteria for Sirs with leukocytosis, tachypnea. --will get Procalcitonin and trend lactic acid levels per sepsis protocol. - IVF: NS at 75 cc/h (due to fluid overload, cannot give aggressive IV fluid - start Zosyn as above   Inpatient status:  # Patient requires inpatient status due to high intensity of service, high risk for further deterioration and high frequency of surveillance required.  I certify that at the point of admission it is my clinical judgment that the patient will require inpatient hospital care spanning beyond 2 midnights from the point of admission.  . This patient has multiple chronic comorbidities including hypertension, hyperlipidemia, diet-controlled diabetes, COPD on home oxygen, GERD, gout, depression with anxiety, tobacco abuse, atrial fibrillation not on anticoagulants, AAA, CAD, CABG, CHF, CKD-4, GI bleeding, mitral valve regurgitation, PAD. Marland Kitchen Now patient has presenting symptoms include abdominal pain, intermittent chest pain . The worrisome physical exam findings include abdominal tenderness . The initial radiographic and laboratory data are worrisome because of elevated lipase, elevated troponin, low potassium, leukocytosis, CT scan showed worsening pancreatic inflammatory change and Pseudomonas.. . Current medical needs: please see my assessment and plan      DVT ppx: SQ Heparin   Code Status: Full code Family Communication: None at bed side.      Disposition Plan:  Anticipate discharge back to previous home environment Consults called:   none Admission status:   Inpatient/tele      Date of Service 05/29/2018    Ivor Costa [facility] Pager [phone]  If 7PM-7AM, please contact night-coverage [email or web address] Password [email or web address] 05/29/2018, 5:31 AM

## 2018-05-28 NOTE — ED Notes (Signed)
Pt IV infiltrated with potassium going. MD notified. Warm moist compress applied to area.

## 2018-05-28 NOTE — ED Notes (Signed)
Purewick placed on pt. 

## 2018-05-29 ENCOUNTER — Other Ambulatory Visit (HOSPITAL_COMMUNITY): Payer: Self-pay

## 2018-05-29 ENCOUNTER — Inpatient Hospital Stay (HOSPITAL_COMMUNITY): Payer: Medicare Other

## 2018-05-29 ENCOUNTER — Ambulatory Visit: Payer: Self-pay | Admitting: Gastroenterology

## 2018-05-29 DIAGNOSIS — R1031 Right lower quadrant pain: Secondary | ICD-10-CM

## 2018-05-29 DIAGNOSIS — I160 Hypertensive urgency: Secondary | ICD-10-CM

## 2018-05-29 DIAGNOSIS — I480 Paroxysmal atrial fibrillation: Secondary | ICD-10-CM

## 2018-05-29 DIAGNOSIS — I248 Other forms of acute ischemic heart disease: Secondary | ICD-10-CM

## 2018-05-29 DIAGNOSIS — R935 Abnormal findings on diagnostic imaging of other abdominal regions, including retroperitoneum: Secondary | ICD-10-CM

## 2018-05-29 DIAGNOSIS — I25118 Atherosclerotic heart disease of native coronary artery with other forms of angina pectoris: Secondary | ICD-10-CM

## 2018-05-29 DIAGNOSIS — R748 Abnormal levels of other serum enzymes: Secondary | ICD-10-CM

## 2018-05-29 DIAGNOSIS — I10 Essential (primary) hypertension: Secondary | ICD-10-CM

## 2018-05-29 DIAGNOSIS — I5032 Chronic diastolic (congestive) heart failure: Secondary | ICD-10-CM

## 2018-05-29 DIAGNOSIS — N184 Chronic kidney disease, stage 4 (severe): Secondary | ICD-10-CM

## 2018-05-29 DIAGNOSIS — K8689 Other specified diseases of pancreas: Secondary | ICD-10-CM

## 2018-05-29 DIAGNOSIS — Z951 Presence of aortocoronary bypass graft: Secondary | ICD-10-CM

## 2018-05-29 DIAGNOSIS — K859 Acute pancreatitis without necrosis or infection, unspecified: Secondary | ICD-10-CM

## 2018-05-29 DIAGNOSIS — E43 Unspecified severe protein-calorie malnutrition: Secondary | ICD-10-CM

## 2018-05-29 LAB — BASIC METABOLIC PANEL
ANION GAP: 13 (ref 5–15)
BUN: 31 mg/dL — AB (ref 8–23)
CO2: 28 mmol/L (ref 22–32)
Calcium: 7.4 mg/dL — ABNORMAL LOW (ref 8.9–10.3)
Chloride: 101 mmol/L (ref 98–111)
Creatinine, Ser: 1.91 mg/dL — ABNORMAL HIGH (ref 0.44–1.00)
GFR calc Af Amer: 31 mL/min — ABNORMAL LOW (ref >60)
GFR calc non Af Amer: 26 mL/min — ABNORMAL LOW (ref >60)
GLUCOSE: 102 mg/dL — AB (ref 70–99)
POTASSIUM: 3 mmol/L — AB (ref 3.5–5.1)
Sodium: 142 mmol/L (ref 135–145)

## 2018-05-29 LAB — CBC WITH DIFFERENTIAL/PLATELET
Abs Immature Granulocytes: 0.1 10*3/uL (ref 0.0–0.1)
Basophils Absolute: 0 10*3/uL (ref 0.0–0.1)
Basophils Relative: 0 %
EOS PCT: 1 %
Eosinophils Absolute: 0.1 10*3/uL (ref 0.0–0.7)
HEMATOCRIT: 26.7 % — AB (ref 36.0–46.0)
HEMOGLOBIN: 8.2 g/dL — AB (ref 12.0–15.0)
Immature Granulocytes: 1 %
LYMPHS ABS: 0.3 10*3/uL — AB (ref 0.7–4.0)
LYMPHS PCT: 2 %
MCH: 30.8 pg (ref 26.0–34.0)
MCHC: 30.7 g/dL (ref 30.0–36.0)
MCV: 100.4 fL — AB (ref 78.0–100.0)
MONO ABS: 0.7 10*3/uL (ref 0.1–1.0)
Monocytes Relative: 5 %
Neutro Abs: 12.9 10*3/uL — ABNORMAL HIGH (ref 1.7–7.7)
Neutrophils Relative %: 91 %
Platelets: 218 10*3/uL (ref 150–400)
RBC: 2.66 MIL/uL — AB (ref 3.87–5.11)
RDW: 16.8 % — ABNORMAL HIGH (ref 11.5–15.5)
WBC: 14.1 10*3/uL — ABNORMAL HIGH (ref 4.0–10.5)

## 2018-05-29 LAB — CBC
HEMATOCRIT: 26.6 % — AB (ref 36.0–46.0)
HEMOGLOBIN: 8.4 g/dL — AB (ref 12.0–15.0)
MCH: 31 pg (ref 26.0–34.0)
MCHC: 31.6 g/dL (ref 30.0–36.0)
MCV: 98.2 fL (ref 78.0–100.0)
Platelets: 215 10*3/uL (ref 150–400)
RBC: 2.71 MIL/uL — ABNORMAL LOW (ref 3.87–5.11)
RDW: 16.7 % — ABNORMAL HIGH (ref 11.5–15.5)
WBC: 14.5 10*3/uL — ABNORMAL HIGH (ref 4.0–10.5)

## 2018-05-29 LAB — COMPREHENSIVE METABOLIC PANEL
ALBUMIN: 1.9 g/dL — AB (ref 3.5–5.0)
ALK PHOS: 36 U/L — AB (ref 38–126)
ALT: 7 U/L (ref 0–44)
ANION GAP: 16 — AB (ref 5–15)
AST: 20 U/L (ref 15–41)
BILIRUBIN TOTAL: 1.4 mg/dL — AB (ref 0.3–1.2)
BUN: 32 mg/dL — AB (ref 8–23)
CO2: 25 mmol/L (ref 22–32)
CREATININE: 1.98 mg/dL — AB (ref 0.44–1.00)
Calcium: 7.8 mg/dL — ABNORMAL LOW (ref 8.9–10.3)
Chloride: 100 mmol/L (ref 98–111)
GFR calc Af Amer: 29 mL/min — ABNORMAL LOW (ref >60)
GFR calc non Af Amer: 25 mL/min — ABNORMAL LOW (ref >60)
Glucose, Bld: 85 mg/dL (ref 70–99)
Potassium: 3.7 mmol/L (ref 3.5–5.1)
Sodium: 141 mmol/L (ref 135–145)
Total Protein: 4.5 g/dL — ABNORMAL LOW (ref 6.5–8.1)

## 2018-05-29 LAB — TROPONIN I
TROPONIN I: 0.44 ng/mL — AB (ref <0.03)
Troponin I: 0.16 ng/mL (ref <0.03)
Troponin I: 0.35 ng/mL (ref <0.03)
Troponin I: 0.52 ng/mL (ref <0.03)
Troponin I: 0.34 ng/mL (ref <0.03)

## 2018-05-29 LAB — LIPID PANEL
CHOL/HDL RATIO: 4.3 ratio
CHOLESTEROL: 85 mg/dL (ref 0–200)
HDL: 20 mg/dL — AB (ref >40)
LDL Cholesterol: 38 mg/dL (ref 0–99)
Triglycerides: 135 mg/dL (ref <150)
VLDL: 27 mg/dL (ref 0–40)

## 2018-05-29 LAB — LIPASE, BLOOD: Lipase: 667 U/L — ABNORMAL HIGH (ref 11–51)

## 2018-05-29 LAB — PROTIME-INR
INR: 1.13
Prothrombin Time: 14.4 seconds (ref 11.4–15.2)

## 2018-05-29 LAB — GLUCOSE, CAPILLARY
GLUCOSE-CAPILLARY: 95 mg/dL (ref 70–99)
Glucose-Capillary: 90 mg/dL (ref 70–99)
Glucose-Capillary: 84 mg/dL (ref 70–99)
Glucose-Capillary: 93 mg/dL (ref 70–99)

## 2018-05-29 LAB — HEMOGLOBIN A1C
Hgb A1c MFr Bld: 5.3 % (ref 4.8–5.6)
MEAN PLASMA GLUCOSE: 105.41 mg/dL

## 2018-05-29 LAB — BRAIN NATRIURETIC PEPTIDE: B NATRIURETIC PEPTIDE 5: 2077.1 pg/mL — AB (ref 0.0–100.0)

## 2018-05-29 LAB — LACTIC ACID, PLASMA
LACTIC ACID, VENOUS: 1 mmol/L (ref 0.5–1.9)
Lactic Acid, Venous: 0.8 mmol/L (ref 0.5–1.9)

## 2018-05-29 LAB — MAGNESIUM
MAGNESIUM: 1.3 mg/dL — AB (ref 1.7–2.4)
MAGNESIUM: 2.1 mg/dL (ref 1.7–2.4)

## 2018-05-29 LAB — PROCALCITONIN: Procalcitonin: 0.17 ng/mL

## 2018-05-29 MED ORDER — LACTATED RINGERS IV SOLN
INTRAVENOUS | Status: AC
  Administered 2018-05-29: 11:00:00 via INTRAVENOUS

## 2018-05-29 MED ORDER — LEVALBUTEROL HCL 1.25 MG/0.5ML IN NEBU
1.2500 mg | INHALATION_SOLUTION | Freq: Four times a day (QID) | RESPIRATORY_TRACT | Status: DC | PRN
  Filled 2018-05-29: qty 0.5

## 2018-05-29 MED ORDER — POTASSIUM CHLORIDE CRYS ER 20 MEQ PO TBCR
40.0000 meq | EXTENDED_RELEASE_TABLET | ORAL | Status: AC
  Administered 2018-05-29 (×2): 40 meq via ORAL
  Filled 2018-05-29 (×3): qty 2

## 2018-05-29 MED ORDER — DOCUSATE SODIUM 100 MG PO CAPS
100.0000 mg | ORAL_CAPSULE | Freq: Every day | ORAL | Status: DC
  Administered 2018-05-31: 100 mg via ORAL
  Filled 2018-05-29 (×2): qty 1

## 2018-05-29 MED ORDER — PANTOPRAZOLE SODIUM 40 MG PO PACK
40.0000 mg | PACK | Freq: Every day | ORAL | Status: DC
  Administered 2018-05-30 – 2018-06-05 (×6): 40 mg
  Filled 2018-05-29 (×7): qty 20

## 2018-05-29 MED ORDER — FERROUS SULFATE 325 (65 FE) MG PO TABS: 325.0000 mg | ORAL_TABLET | Freq: Every day | ORAL | Status: DC

## 2018-05-29 MED ORDER — JEVITY 1.2 CAL PO LIQD
1000.0000 mL | ORAL | Status: DC
  Filled 2018-05-29: qty 1000

## 2018-05-29 MED ORDER — VITAL AF 1.2 CAL PO LIQD
1500.0000 mL | ORAL | Status: DC
  Administered 2018-05-29 – 2018-05-31 (×2): 1500 mL
  Filled 2018-05-29 (×3): qty 1500

## 2018-05-29 MED ORDER — VITAL AF 1.2 CAL PO LIQD
1000.0000 mL | ORAL | Status: DC
  Filled 2018-05-29: qty 1000

## 2018-05-29 MED ORDER — SODIUM CHLORIDE 0.9 % IV SOLN
INTRAVENOUS | Status: DC | PRN
  Administered 2018-05-29 – 2018-05-31 (×2): 1000 mL via INTRAVENOUS
  Administered 2018-06-01 – 2018-06-08 (×4): 250 mL via INTRAVENOUS

## 2018-05-29 MED ORDER — LEVALBUTEROL HCL 1.25 MG/0.5ML IN NEBU
1.2500 mg | INHALATION_SOLUTION | Freq: Two times a day (BID) | RESPIRATORY_TRACT | Status: DC
  Administered 2018-05-29: 1.25 mg via RESPIRATORY_TRACT
  Filled 2018-05-29 (×2): qty 0.5

## 2018-05-29 MED ORDER — POLYETHYLENE GLYCOL 3350 17 G PO PACK: 17.0000 g | PACK | Freq: Every day | ORAL | Status: DC | PRN

## 2018-05-29 MED ORDER — MAGNESIUM SULFATE 2 GM/50ML IV SOLN
2.0000 g | Freq: Once | INTRAVENOUS | Status: AC
  Administered 2018-05-29: 2 g via INTRAVENOUS
  Filled 2018-05-29: qty 50

## 2018-05-29 MED ORDER — ONDANSETRON HCL 4 MG/2ML IJ SOLN
4.0000 mg | Freq: Four times a day (QID) | INTRAMUSCULAR | Status: DC | PRN
  Administered 2018-05-29 – 2018-06-15 (×4): 4 mg via INTRAVENOUS
  Filled 2018-05-29 (×5): qty 2

## 2018-05-29 MED ORDER — NICOTINE 21 MG/24HR TD PT24
21.0000 mg | MEDICATED_PATCH | Freq: Every day | TRANSDERMAL | Status: DC
  Administered 2018-05-29 – 2018-06-14 (×17): 21 mg via TRANSDERMAL
  Filled 2018-05-29 (×17): qty 1

## 2018-05-29 NOTE — Progress Notes (Addendum)
Cortrak Tube Team Note:  Consult received to place a Cortrak feeding tube.   A 10 F Cortrak tube was placed in the RIGHT nare and secured with a nasal bridle at 76 cm. Per the Cortrak monitor reading the tube tip is at the pylorus.   X-ray is required, abdominal x-ray has been ordered by the Cortrak team. Please confirm tube placement before using the Cortrak tube.   If the tube becomes dislodged please keep the tube and contact the Cortrak team at [email or web address] (password [email or web address]) for replacement.  If after hours and replacement cannot be delayed, place a NG tube and confirm placement with an abdominal x-ray.   [name] RD, LDN Clinical Nutrition Pager # [phone]

## 2018-05-29 NOTE — Progress Notes (Signed)
PROGRESS NOTE  [name] WNI:[id number] DOB: 1951-07-31 DOA: 05/28/2018 PCP: Bernerd Limbo, MD  Brief Summary:  Hx of A fib, not on anticoagulation due to gi bleed, CADs/p CABG, DES, dCHF, CKD-iv,s/p avf fistula placement, recent admission for gallstone pancreatitis, S/p of cholecystectomy in 04/2018, presenting with ab pain, n/v, recurrent pancreatitis, possible pseudocyst.  Has leukocytosis with WBC 23.7, troponin elevation  She was seen in ED on 9/9 for ab pain, n/v, she was advised to follow up with gi in the clinic  She symptom has progressed, she Vomited 8-10 times last week, she barely eat since wend[place] [place] loss FTT  HPI/Recap of past 24 hours:  Right sided abdominal pain, feeling nauseous No fever, denies chest pain  Daughter at bedside  Assessment/Plan: Principal Problem:   Pancreatitis, recurrent Active Problems:   TOBACCO ABUSE   Essential hypertension   Atrial fibrillation (HCC)   COPD (chronic obstructive pulmonary disease) (HCC)   S/P CABG x 3   Anemia   GERD (gastroesophageal reflux disease)   Hyperlipidemia   CAD (coronary artery disease)   Chronic kidney disease (CKD), stage IV (severe) (HCC)   Chronic diastolic congestive heart failure (HCC)   Type II diabetes mellitus with renal manifestations (HCC)   Elevated troponin   Hypertensive urgency   Hypokalemia   SIRS (systemic inflammatory response syndrome) (HCC)   Protein-calorie malnutrition, severe    Ab pain /N/V(right side ) -s/p cholecystectomy on 8/14 for gallstone pancreatitis -Worsening of  lipase,  -CT ab/pel without contrast, "Worsening severe inflammatory changes surrounding the pancreatic head and neck, descending duodenum and hepatic flexure of the colon. Enlarging nonspecific complex poorly defined fluid collection in the anterior pancreatic head. New complex fluid collection inferior to the gallbladder fossa with layering hyperdense material. Burtis Junes worsening pancreatitis  with enlarging pancreatic pseudocysts. Marked wall thickening in the descending duodenum has worsened, and could be reactive, with peptic ulcer disease difficult to exclude. No free air. Short-term follow-up MRI or CT abdomen with IV contrast suggested. 2. New wall thickening throughout the hepatic flexure of the colon, probably reactive. 3. Cholecystectomy. No intrahepatic biliary ductal dilatation. CBD diameter 7 mm, within normal post cholecystectomy limits. 4. New small volume perihepatic ascites. 5.  Aortic Atherosclerosis (ICD10-I70.0). - Case discussed with [facility] GI, who recommended post pyloric feeding, continue ivf and empirical abx zosyn for now, will continue follow GI recommendation   Acute on chronic anemia hgb dropped from 11 to 8.4 Will follow gi recommendation, transfuse to keep sbp > 8  Hypokalemia/hypomagnesemia:  replace k/mag  Leukocytosis -From stress? Blood culture obtained, ua pending collection -chest imaging on focal infiltrate -she is on empirical abx zosyn to cover intraabdominal sourse  H/o CAD s/p CABG, DES placement in 2016, chronic RBBB, diastolic chf, PAF not on anticoagulation  -She denies chest pain, does has abdominal pain, she appear dry, no edema,  -CT chest without contrast "1. No lung masses or significant pulmonary nodules. 2. Borderline mild cardiomegaly. Diffuse interlobular septal thickening suggests mild pulmonary edema. No pleural effusions. 3. Dilated main pulmonary artery, suggesting pulmonary arterial Hypertension." -Elevation of troponin -Order echocardiogram -Cardiology consulted   [place] Not on HD S/p avf placement 10/2017, avf stenosis followed by vascular outpatient Cr appear at baseline' Monitor urine output, cr , renal dosing meds  HTN Stable on current meds  h/o AAA repair in 11/8180, complicated by retroperitoneal bleed  PAD (bilateral renal artery stents)  Smoker, COPD, Report on home o2 at night -smoking  cessation education provided,  nicotine patch ordered  Severe malnutrition in context of chronic illness -nutrition consult appreciated  FTT: family wants home health and referral to palliative care in the community She does not wants to go to snf. Will get PT /OT eval once medically more stable  Code Status: full code, verified with daughter and patient  Family Communication: patient and daughter at bedside  Disposition Plan: not very to discharge Prognosis is guarded    Consultants:  GI  cardiology  Procedures:  cortrak feeding tube placement on 9/23  Antibiotics:  Zosyn from admission   Objective: BP 140/65 (BP Location: Right Arm)   Pulse 88   Temp 98.5 F (36.9 C) (Oral)   Resp 18   SpO2 96%   Intake/Output Summary (Last 24 hours) at 05/29/2018 1844 Last data filed at 05/29/2018 1434 Gross per 24 hour  Intake 699.88 ml  Output 300 ml  Net 399.88 ml   There were no vitals filed for this visit.  Exam: Patient is examined daily including today on 05/29/2018, exams remain the same as of yesterday except that has changed    General:  Frail, in pain, chronic ill appearing, pale  Cardiovascular: RRR  Respiratory: CTABL  Abdomen: right sided abdomen tender, guarding,  positive BS  Musculoskeletal: No Edema  Neuro: lethargic, oriented x3  Data Reviewed: Basic Metabolic Panel: Recent Labs  Lab 05/28/18 1507 05/29/18 0335 05/29/18 1528  NA 141 142 141  K 3.1* 3.0* 3.7  CL 97* 101 100  CO2 27 28 25   GLUCOSE 178* 102* 85  BUN 27* 31* 32*  CREATININE 1.80* 1.91* 1.98*  CALCIUM 8.1* 7.4* 7.8*  MG  --  1.3* 2.1   Liver Function Tests: Recent Labs  Lab 05/28/18 1507 05/29/18 1528  AST 18 20  ALT 8 7  ALKPHOS 48 36*  BILITOT 1.7* 1.4*  PROT 5.2* 4.5*  ALBUMIN 2.4* 1.9*   Recent Labs  Lab 05/28/18 1507 05/29/18 0335  LIPASE 399* 667*   No results for input(s): AMMONIA in the last 168 hours. CBC: Recent Labs  Lab 05/28/18 1507  05/29/18 0335 05/29/18 1528  WBC 23.7* 14.5* 14.1*  NEUTROABS  --   --  12.9*  HGB 11.0* 8.4* 8.2*  HCT 34.2* 26.6* 26.7*  MCV 97.7 98.2 100.4*  PLT 339 215 218   Cardiac Enzymes:   Recent Labs  Lab 05/28/18 2325 05/29/18 0335 05/29/18 0954 05/29/18 1528  TROPONINI 0.16* 0.35* 0.52* 0.44*   BNP (last 3 results) Recent Labs    04/17/18 0534 04/18/18 0501 05/28/18 2325  BNP 2,002.3* 1,099.7* 2,077.1*    ProBNP (last 3 results) No results for input(s): PROBNP in the last 8760 hours.  CBG: Recent Labs  Lab 05/29/18 0741 05/29/18 1116 05/29/18 1649  GLUCAP 90 95 84    Recent Results (from the past 240 hour(s))  Blood culture (routine x 2)     Status: None (Preliminary result)   Collection Time: 05/28/18  5:54 PM  Result Value Ref Range Status   Specimen Description BLOOD RIGHT ANTECUBITAL  Final   Special Requests   Final    BOTTLES DRAWN AEROBIC AND ANAEROBIC Blood Culture results may not be optimal due to an excessive volume of blood received in culture bottles   Culture   Final    NO GROWTH < 24 HOURS Performed at [facility] [address], [place], [place] [address]    Report Status PENDING  Incomplete  Blood culture (routine x 2)  Status: None (Preliminary result)   Collection Time: 05/28/18  6:35 PM  Result Value Ref Range Status   Specimen Description BLOOD BLOOD RIGHT FOREARM  Final   Special Requests   Final    BOTTLES DRAWN AEROBIC ONLY Blood Culture results may not be optimal due to an inadequate volume of blood received in culture bottles   Culture   Final    NO GROWTH < 24 HOURS Performed at [facility], [address], [place], [place] [address]    Report Status PENDING  Incomplete     Studies: Ct Abdomen Pelvis Wo Contrast  Result Date: 05/28/2018 CLINICAL DATA:  New tiny right lung nodule on chest radiograph. Severe right lower quadrant abdominal pain with nausea, vomiting and diarrhea. Chronic kidney disease. Prior  cholecystectomy 04/19/2018, hysterectomy and appendectomy. Prior bowel resection. EXAM: CT CHEST, ABDOMEN AND PELVIS WITHOUT CONTRAST TECHNIQUE: Multidetector CT imaging of the chest, abdomen and pelvis was performed following the standard protocol without IV contrast. COMPARISON:  Chest radiograph from earlier today. FINDINGS: CT CHEST FINDINGS Cardiovascular: Borderline mild cardiomegaly. No significant pericardial effusion/thickening. 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 Atherosclerotic nonaneurysmal thoracic aorta. Proximal left subclavian artery stent is noted. Dilated main pulmonary artery (4.0 cm diameter). Mediastinum/Nodes: No discrete thyroid nodules. Unremarkable esophagus. No axillary adenopathy. Mild right paratracheal adenopathy up to 1.1 cm (series 3/image 21). Mildly enlarged 1.1 cm subcarinal node (series 3/image 26). No discrete hilar adenopathy on this noncontrast scan. Lungs/Pleura: No pneumothorax. No pleural effusion. A few tiny calcified granulomas are present in the upper lobes bilaterally. No acute consolidative airspace disease, lung masses or significant pulmonary nodules. Diffuse interlobular septal thickening. Musculoskeletal: No aggressive appearing focal osseous lesions. Intact sternotomy wires. Moderate thoracic spondylosis. CT ABDOMEN PELVIS FINDINGS Hepatobiliary: Normal liver with no liver mass. Cholecystectomy. Bile ducts are within normal post cholecystectomy limits with CBD diameter 7 mm. Pancreas: Thickening of the pancreatic head and neck. Prominent peripancreatic fat stranding and ill-defined fluid in the pancreatic head and neck, worsened. Poorly defined 5.1 x 2.5 cm anterior pancreatic head complex fluid collection with heterogeneous internal density (series 3/image 66), increased from 2.3 x 1.2 cm. No definite pancreatic duct dilation. Severe wall thickening throughout the adjacent descending duodenum, worsened. Fluid collection inferior to the  gallbladder fossa measures 5.0 x 3.0 cm (series 3/image 71) and demonstrates layering hyperdense material, new. Spleen: Normal size. No mass. Adrenals/Urinary Tract: Normal adrenals. No hydronephrosis. No renal stones. Partially exophytic simple 1.3 cm posterior interpolar left renal cyst. No additional contour deforming renal lesions. Normal bladder. Stomach/Bowel: Normal non-distended stomach. Normal caliber small bowel with no small bowel wall thickening. Appendectomy. New segmental wall thickening in the hepatic flexure of the colon. No additional sites of large bowel wall thickening. Stable postsurgical changes from distal large bowel resection. Vascular/Lymphatic: Atherosclerotic abdominal aorta with stable 5.0 cm infrarenal abdominal aortic aneurysm status post aorto bi-iliac stent graft repair. Surgical clips are noted in the right inguinal region. No pathologically enlarged lymph nodes in the abdomen or pelvis. Reproductive: Status post hysterectomy, with no abnormal findings at the vaginal cuff. No adnexal mass. Other: No pneumoperitoneum.  New small volume perihepatic ascites. Musculoskeletal: No aggressive appearing focal osseous lesions. Mild lumbar spondylosis. IMPRESSION: CT CHEST: 1. No lung masses or significant pulmonary nodules. 2. Borderline mild cardiomegaly. Diffuse interlobular septal thickening suggests mild pulmonary edema. No pleural effusions. 3. Dilated main pulmonary artery, suggesting pulmonary arterial hypertension. CT ABDOMEN PELVIS: 1. Worsening severe inflammatory changes surrounding  the pancreatic head and neck, descending duodenum and hepatic flexure of the colon. Enlarging nonspecific complex poorly defined fluid collection in the anterior pancreatic head. New complex fluid collection inferior to the gallbladder fossa with layering hyperdense material. Burtis Junes worsening pancreatitis with enlarging pancreatic pseudocysts. Marked wall thickening in the descending duodenum has  worsened, and could be reactive, with peptic ulcer disease difficult to exclude. No free air. Short-term follow-up MRI or CT abdomen with IV contrast suggested. 2. New wall thickening throughout the hepatic flexure of the colon, probably reactive. 3. Cholecystectomy. No intrahepatic biliary ductal dilatation. CBD diameter 7 mm, within normal post cholecystectomy limits. 4. New small volume perihepatic ascites. 5.  Aortic Atherosclerosis (ICD10-I70.0). Electronically Signed   By: Ilona Sorrel M.D.   On: 05/28/2018 20:41   Ct Chest Wo Contrast  Result Date: 05/28/2018 CLINICAL DATA:  New tiny right lung nodule on chest radiograph. Severe right lower quadrant abdominal pain with nausea, vomiting and diarrhea. Chronic kidney disease. Prior cholecystectomy 04/19/2018, hysterectomy and appendectomy. Prior bowel resection. EXAM: CT CHEST, ABDOMEN AND PELVIS WITHOUT CONTRAST TECHNIQUE: Multidetector CT imaging of the chest, abdomen and pelvis was performed following the standard protocol without IV contrast. COMPARISON:  Chest radiograph from earlier today. FINDINGS: CT CHEST FINDINGS Cardiovascular: Borderline mild cardiomegaly. No significant pericardial effusion/thickening. 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 Atherosclerotic nonaneurysmal thoracic aorta. Proximal left subclavian artery stent is noted. Dilated main pulmonary artery (4.0 cm diameter). Mediastinum/Nodes: No discrete thyroid nodules. Unremarkable esophagus. No axillary adenopathy. Mild right paratracheal adenopathy up to 1.1 cm (series 3/image 21). Mildly enlarged 1.1 cm subcarinal node (series 3/image 26). No discrete hilar adenopathy on this noncontrast scan. Lungs/Pleura: No pneumothorax. No pleural effusion. A few tiny calcified granulomas are present in the upper lobes bilaterally. No acute consolidative airspace disease, lung masses or significant pulmonary nodules. Diffuse interlobular septal thickening.  Musculoskeletal: No aggressive appearing focal osseous lesions. Intact sternotomy wires. Moderate thoracic spondylosis. CT ABDOMEN PELVIS FINDINGS Hepatobiliary: Normal liver with no liver mass. Cholecystectomy. Bile ducts are within normal post cholecystectomy limits with CBD diameter 7 mm. Pancreas: Thickening of the pancreatic head and neck. Prominent peripancreatic fat stranding and ill-defined fluid in the pancreatic head and neck, worsened. Poorly defined 5.1 x 2.5 cm anterior pancreatic head complex fluid collection with heterogeneous internal density (series 3/image 66), increased from 2.3 x 1.2 cm. No definite pancreatic duct dilation. Severe wall thickening throughout the adjacent descending duodenum, worsened. Fluid collection inferior to the gallbladder fossa measures 5.0 x 3.0 cm (series 3/image 71) and demonstrates layering hyperdense material, new. Spleen: Normal size. No mass. Adrenals/Urinary Tract: Normal adrenals. No hydronephrosis. No renal stones. Partially exophytic simple 1.3 cm posterior interpolar left renal cyst. No additional contour deforming renal lesions. Normal bladder. Stomach/Bowel: Normal non-distended stomach. Normal caliber small bowel with no small bowel wall thickening. Appendectomy. New segmental wall thickening in the hepatic flexure of the colon. No additional sites of large bowel wall thickening. Stable postsurgical changes from distal large bowel resection. Vascular/Lymphatic: Atherosclerotic abdominal aorta with stable 5.0 cm infrarenal abdominal aortic aneurysm status post aorto bi-iliac stent graft repair. Surgical clips are noted in the right inguinal region. No pathologically enlarged lymph nodes in the abdomen or pelvis. Reproductive: Status post hysterectomy, with no abnormal findings at the vaginal cuff. No adnexal mass. Other: No pneumoperitoneum.  New small volume perihepatic ascites. Musculoskeletal: No aggressive appearing focal osseous lesions. Mild lumbar  spondylosis. IMPRESSION: CT CHEST: 1. No lung masses or significant pulmonary  nodules. 2. Borderline mild cardiomegaly. Diffuse interlobular septal thickening suggests mild pulmonary edema. No pleural effusions. 3. Dilated main pulmonary artery, suggesting pulmonary arterial hypertension. CT ABDOMEN PELVIS: 1. Worsening severe inflammatory changes surrounding the pancreatic head and neck, descending duodenum and hepatic flexure of the colon. Enlarging nonspecific complex poorly defined fluid collection in the anterior pancreatic head. New complex fluid collection inferior to the gallbladder fossa with layering hyperdense material. Burtis Junes worsening pancreatitis with enlarging pancreatic pseudocysts. Marked wall thickening in the descending duodenum has worsened, and could be reactive, with peptic ulcer disease difficult to exclude. No free air. Short-term follow-up MRI or CT abdomen with IV contrast suggested. 2. New wall thickening throughout the hepatic flexure of the colon, probably reactive. 3. Cholecystectomy. No intrahepatic biliary ductal dilatation. CBD diameter 7 mm, within normal post cholecystectomy limits. 4. New small volume perihepatic ascites. 5.  Aortic Atherosclerosis (ICD10-I70.0). Electronically Signed   By: Ilona Sorrel M.D.   On: 05/28/2018 20:41   Dg Abd Portable 1v  Result Date: 05/29/2018 CLINICAL DATA:  Feeding tube placement EXAM: PORTABLE ABDOMEN - 1 VIEW COMPARISON:  None. FINDINGS: Gaseous distension of the stomach and proximal small bowel. Feeding tube with the tip projecting over antrum versus proximal duodenum. No evidence of pneumoperitoneum, portal venous gas or pneumatosis. No pathologic calcifications along the expected course of the ureters. Aorto bi-iliac stent graft is noted. Left renal artery stent is noted. Osseous structures are unremarkable. IMPRESSION: Gaseous distension of the stomach and proximal small bowel. Feeding tube with the tip projecting over antrum versus  proximal duodenum. Electronically Signed   By: Kathreen Devoid   On: 05/29/2018 15:30    Scheduled Meds: . allopurinol  100 mg Oral Daily  . ALPRAZolam  0.5 mg Oral QHS  . amLODipine  10 mg Oral Daily  . atorvastatin  40 mg Oral QPM  . carvedilol  25 mg Oral BID WC  . cholecalciferol  5,000 Units Oral Daily  . cloNIDine  0.2 mg Oral TID  . clopidogrel  75 mg Oral Q breakfast  . docusate sodium  100 mg Oral Daily  . DULoxetine  60 mg Oral Daily  . famotidine  20 mg Oral QAC supper  . fenofibrate  160 mg Oral QHS  . [START ON 05/30/2018] ferrous sulfate  325 mg Oral Q breakfast  . gabapentin  300 mg Oral BID  . heparin  5,000 Units Subcutaneous Q8H  . hydrALAZINE  100 mg Oral TID  . isosorbide mononitrate  15 mg Oral Daily  . levalbuterol  1.25 mg Nebulization BID  . mirtazapine  7.5 mg Oral QHS  . mometasone-formoterol  2 puff Inhalation BID  . nicotine  21 mg Transdermal Daily  . [START ON 05/30/2018] pantoprazole sodium  40 mg Per Tube Daily    Continuous Infusions: . feeding supplement (VITAL AF 1.2 CAL)    . lactated ringers 100 mL/hr at 05/29/18 1102  . piperacillin-tazobactam (ZOSYN)  IV 3.375 g (05/29/18 1323)     Time spent: 8mins, case discussed with GI I have personally reviewed and interpreted on  05/29/2018 daily labs, tele strips, imagings as discussed above under date review session and assessment and plans.  I reviewed all nursing notes, pharmacy notes, consultant notes,  vitals, pertinent old records  I have discussed plan of care as described above with RN , patient and family on 05/29/2018   Florencia Reasons MD, PhD  [facility] Pager [phone]. If 7PM-7AM, please contact night-coverage at [email or web address], password [facility] 05/29/2018,  6:44 PM  LOS: 1 day

## 2018-05-29 NOTE — Progress Notes (Signed)
Paged GI Azucena Freed cortrac could not get post pyloric.  T/O when order for tube feeding just start feedings and see how tolerates.  Chest x-ray ordered to verify position of cor-trac.

## 2018-05-29 NOTE — Care Management Note (Addendum)
Case Management Note  Patient Details  Name: [name] MRN: [id number] Date of Birth: 1951-05-01  Subjective/Objective:                    Action/Plan:Spoke with Dr Erlinda Hong, referral for Palliative Care at Home given to [facility] with [place].  Discussed discharge planning with patient and daughter [name].   Patient active with [facility]. Both voice understanding regarding PT recommendation for SNF. Patient declines SNF and wants to return to home with Home health through [facility].   Daughter [name] requesting Palliative Care follow her mother at home also. Choice offered they would like [facility] and [facility]. Patient has walker and 3 in 1 and oxygen at home through [facility].  Paged Dr Erlinda Hong regarding PC referral.  Expected Discharge Date:                  Expected Discharge Plan:  [place]  In-House Referral:     Discharge planning Services  CM Consult  Post Acute Care Choice:  Home Health Choice offered to:  Patient, Adult Children  DME Arranged:  N/A DME Agency:  NA  HH Arranged:  RN, PT, OT, Nurse's Aide, Social Work [employer] Agency:  [facility]  Status of Service:  In process, will continue to follow  If discussed at Long Length of Stay Meetings, dates discussed:    Additional Comments:  [name], RN 05/29/2018, 12:31 PM

## 2018-05-29 NOTE — Progress Notes (Signed)
Trop 0.52 advised Dr. Erlinda Hong on floor.

## 2018-05-29 NOTE — Progress Notes (Signed)
Paged Dr. Erlinda Hong pt trop increased from 0.16 @ 2315 to 0.35 @ 0335 FYI.

## 2018-05-29 NOTE — Progress Notes (Signed)
Abnormal ECG, T wave abnomalities, RBBB, consider inferior infarct, paged Dr. Carlis Stable.

## 2018-05-29 NOTE — Progress Notes (Signed)
Inspiratory flow adequate for MDI.

## 2018-05-29 NOTE — Consult Note (Addendum)
Cardiology Consultation:   Patient ID: [name]; [id number]; 1951/07/23   Admit date: 05/28/2018 Date of Consult: 05/29/2018  Primary Care Provider: Bernerd Limbo, MD Primary Cardiologist: Dr. Sherren Mocha, MD   Patient Profile:   [name] is a 67 y.o. female with a hx of hypertension, hyperlipidemia, diet-controlled diabetes, COPD on home oxygen, GERD, gout, depression, ongoing tobacco abuse, paroxysmal atrial fibrillation not on anticoagulation, AAA s/p endovascular repair 01/2018 per Dr. Trula Slade, CAD s/p CABG with MVR (2012) with PCI to SVG to rPDA (2016), CHF, CKD stage IV s/p fistula placement, GI bleed and PAD (bilateral renal artery stents) who is being seen today for the evaluation of elevated troponin and chest pain at the request of Dr. Blaine Hamper.  History of Present Illness:   [name] is a 67 year old female with a history stated above who presented to [facility] on 05/28/2018 with complaints of worsening abdominal pain which began approximately last Thursday evening.  History obtained from patient and family at bedside.  Daughter reports that since Thursday evening patient has had ongoing nausea and vomiting and acute abdominal pain. She was recently hospitalized from 8/4-8/06 secondary to GI bleeding requiring blood transfusion and gallstone pancreatitis. She underwent a laparoscopic cholecystectomy at that time and was discharged in stable condition. Since that time, she has been seen and treated in the ED one several occasasions for acute abdominal pain/pancreatitis without complete resolution. Last Thursday evening, she reports that she began having right-sided, lower abdominal pain, rated an 10/10 in severity. She tried to control her pain at home, however when it became unbearable, her family made her come to the ED for further evaluation.  Additionally, she reports a brief, fleeting episode of chest tightness that occurred last Friday. She denies associated symptoms however with  her complicated hx this makes it somewhat difficult to distinguish. She reports mild cough and shortness of breath however, this is a chronic issue secondary to COPD with no significant change. She denies palpitations, LE swelling, orthopnea, PND, dizziness, presyncopal or syncopal episodes. She continues to smoke cigarettes, but denies alcohol or illicit drug use. She has a long and complicated vascular history as described above.   In the ED, EKG revealed NSR with HR 86 with T wave inversions in leads II, III, aVF and V1 through V3.  T waves appear deeper and more pronounced in V1 through V3 compared to prior tracings.  Troponin elevated at 0.16, 0.35, 0.52 without recurrent chest pain symptoms.  BP markedly elevated on arrival at 211/107 however daughter reports that she has not had her medications since Thursday of last week.  CXR with cardiomegaly and mild vascular congestion or chronic interstitial coarsening. Tiny nodule right upper lung not visible on prior studies and indeterminate.  Recommendations for CT without contrast for further evaluation. Abdominal CT with no lung masses, borderline mild cardiomegaly with diffuse internal liver septal thickening suggesting mild pulmonary edema and dilated main pulmonary artery suggesting pulmonary arterial hypertension.  CT of abdomen with worsening severe inflammatory changes surrounding the pancreatic head and neck favoring worsening pancreatitis with enlarging pancreatic pseudocyst.  Of note, she was last seen by her primary cardiologist on 06/30/2017 for follow-up. Per chart review, she has been followed for peripheral arterial disease primarily by vascular surgery. She has had multiple renal artery stenting procedures, carotid endarterectomy, failed lower extremity bypass, left subclavian stenting and recent endovascular AAA repair complicated by RP bleed in May 2019. This was performed given recent GI complaints with no  other etiology. She has undergone  CABG and maze repair with MVR surgery and left atrial appendage clipping. In 2016 she underwent left subclavian stenting to improve inflow to her LIMA graft as well as PCI of the SVG to right PDA.  Cardiology has been asked to evaluate the pt given her extensive and complicated hx, now presenting with elevated troponin and one brief episode of chest tightness.   Past Medical History:  Diagnosis Date  . AAA (abdominal aortic aneurysm) ([place]) 5/09   3.7x3.3 by u/s 2009  . Abnormal EKG    deep TW inversions chronic  . Anemia   . CAD (coronary artery disease)    a. CABG 03/2011 LIMA to LAD, SVG to OM, SVG to PDA. b. cath 06/25/2015 DES to SVG to rPDA, patent LIMA to LAD, patent SVG to OM  . carotid stenosis 5/09   S/p L CEA ;  60-79% bilat ICA by preCABG dopplers 7/12  . CHF (congestive heart failure) ([place])   . Chronic back pain    "all over" (06/24/2015)  . Chronic bronchitis ([place])    "get it pretty much q yr" (06/24/2015)  . CKD (chronic kidney disease)   . Complication of anesthesia 1966   "problem w/ether"  . COPD (chronic obstructive pulmonary disease) ([place])   . Coronary artery disease 5/09   s/p CABG 7/12   L-LAD, S-OM, S-PDA  . Depression   . GERD (gastroesophageal reflux disease)    With hiatal hernia  . GIB (gastrointestinal bleeding) 9/11   S/p EGD with cautery at [facility]  . Heart murmur   . History of blood transfusion "a few times"   "all related to anemia" (06/24/2015)  . History of hiatal hernia   . Hyperlipidemia   . Hypertension   . Mitral regurgitation    3+ MR by intraoperative TEE;  s/p MV repair with Dr. Roxy Manns 7/12  . Myocardial infarction [facility]) 2012 "several"  . On home oxygen therapy    "2 liters at night; negative for sleep apnea"  . PAD (peripheral artery disease) ([facility])    Severe; s/p bilateral renal artery stents, moderate in-stent restenosis  . Paroxysmal atrial fibrillation ([facility])    coumadin;  echo 9/07: EF 60%, mild LVH;  s/p Cox Maze 7/12 with LAA  clipping  . Subclavian artery stenosis, left ([facility])    stented by Dr. Trula Slade on 10/18 to help flow of her LIMA to LAD  . Type II diabetes mellitus ([place])     Past Surgical History:  Procedure Laterality Date  . A/V FISTULAGRAM Left 03/21/2018   Procedure: A/V FISTULAGRAM;  Surgeon: Serafina Mitchell, MD;  Location: [place] [facility];  Service: Cardiovascular;  Laterality: Left;  . ABDOMINAL AORTIC ENDOVASCULAR STENT GRAFT N/A 01/17/2018   Procedure: ABDOMINAL AORTIC ENDOVASCULAR STENT GRAFT WITH CO2;  Surgeon: Serafina Mitchell, MD;  Location: [place];  Service: Vascular;  Laterality: N/A;  . ABDOMINAL HYSTERECTOMY  1990's  . APPENDECTOMY  Aug. 11, 2016   Ruptured  . AV FISTULA PLACEMENT Left 11/03/2017   Procedure: ARTERIOVENOUS (AV) FISTULA CREATION LEFT ARM;  Surgeon: Serafina Mitchell, MD;  Location: [place];  Service: Vascular;  Laterality: Left;  . BOWEL RESECTION  2013  . CARDIAC CATHETERIZATION N/A 06/24/2015   Procedure: Left Heart Cath and Cors/Grafts Angiography;  Surgeon: Leonie Man, MD;  Location: [place] [facility];  Service: Cardiovascular;  Laterality: N/A;  . CARDIAC CATHETERIZATION N/A 06/25/2015   Procedure: Coronary Stent Intervention;  Surgeon:  Sherren Mocha, MD;  Location: [place] [facility];  Service: Cardiovascular;  Laterality: N/A;  . CAROTID ENDARTERECTOMY Left   . CATARACT EXTRACTION Right 03/27/2018  . CHOLECYSTECTOMY N/A 04/19/2018   Procedure: LAPAROSCOPIC CHOLECYSTECTOMY WITH INTRAOPERATIVE CHOLANGIOGRAM;  Surgeon: Rolm Bookbinder, MD;  Location: [place];  Service: General;  Laterality: N/A;  . CORONARY ANGIOPLASTY    . CORONARY ARTERY BYPASS GRAFT  03/05/2011   CABG X3 (LIMA to LAD, SVG to OM, SVG to PDA, EVH via left thigh  . CORONARY STENT PLACEMENT    . ESOPHAGOGASTRODUODENOSCOPY N/A 04/12/2018   Procedure: ESOPHAGOGASTRODUODENOSCOPY (EGD);  Surgeon: [name], MD;  Location: [facility] ENDOSCOPY;  Service: Endoscopy;  Laterality: N/A;  . LACERATION REPAIR  Right 1990's   WRIST  . MAZE  03/05/2011   complete biatrial lesion set with clipping of LA appendage  . MITRAL VALVE REPAIR  03/05/2011   13m Memo 3D ring annuloplasty for ischemic MR  . PERIPHERAL VASCULAR BALLOON ANGIOPLASTY Left 03/21/2018   Procedure: PERIPHERAL VASCULAR BALLOON ANGIOPLASTY;  Surgeon: BSerafina Mitchell MD;  [facility];  Service: Cardiovascular;  Laterality: Left;  . PERIPHERAL VASCULAR CATHETERIZATION N/A 06/24/2015   Procedure: Aortic Arch Angiography;  Surgeon: VSerafina Mitchell MD;  [facility];  Service: Cardiovascular;  Laterality: N/A;  . PERIPHERAL VASCULAR CATHETERIZATION  06/24/2015   Procedure: Peripheral Vascular Intervention;  Surgeon: VSerafina Mitchell MD;  [facility];  Service: Cardiovascular;;  . PERIPHERAL VASCULAR CATHETERIZATION N/A 06/24/2015   Procedure: Abdominal Aortogram;  Surgeon: VSerafina Mitchell MD;  [facility];  Service: Cardiovascular;  Laterality: N/A;  . RENAL ANGIOGRAM Bilateral 09/11/2013   Procedure: RENAL ANGIOGRAM;  Surgeon: VSerafina Mitchell MD;  [facility];  Service: Cardiovascular;  Laterality: Bilateral;  . RIGHT FEMORAL-POPLITEAL BYPASS       Prior to Admission medications   Medication Sig Start Date End Date Taking? Authorizing Provider  acetaminophen (TYLENOL) 325 MG tablet Take 325 mg by mouth every 6 (six) hours as needed (for pain).   Yes [provider]  albuterol (PROVENTIL HFA;VENTOLIN HFA) 108 (90 Base) MCG/ACT inhaler Inhale 2 puffs into the lungs every 4 (four) hours as needed for wheezing or shortness of breath. ProAir Patient taking differently: Inhale 2 puffs into the lungs every 4 (four) hours as needed for wheezing or shortness of breath.  10/08/15  Yes SDelfina Redwood MD  allopurinol (ZYLOPRIM) 100 MG tablet Take 100 mg by mouth daily. 02/02/17  Yes [provider]  ALPRAZolam (Duanne Moron 0.5 MG tablet Take 0.5 mg by mouth  at bedtime.    Yes [provider]  amLODipine (NORVASC) 10 MG tablet Take 10 mg by mouth daily.     Yes [provider]  atorvastatin (LIPITOR) 40 MG tablet Take 40 mg by mouth every evening.  12/07/17  Yes [provider]  budesonide-formoterol (SYMBICORT) 160-4.5 MCG/ACT inhaler Inhale 2 puffs into the lungs 2 (two) times daily as needed (for flares).   Yes [provider]  carvedilol (COREG) 25 MG tablet TAKE 1 TABLET BY MOUTH 2 TIMES A DAY WITH A MEAL Patient taking differently: Take 25 mg by mouth 2 (two) times daily with a meal.  11/04/15  Yes GBurtis Junes NP  Cholecalciferol (VITAMIN D3) 5000 units TABS Take 5,000 Units by mouth daily.   Yes [provider]  cloNIDine (CATAPRES) 0.2 MG tablet Take 1 tablet (0.2 mg total) by mouth 3 (  three) times daily. 07/10/15  Yes Richardson Dopp T, PA-C  clopidogrel (PLAVIX) 75 MG tablet Take 1 tablet (75 mg total) by mouth daily with breakfast. 06/26/15  Yes Almyra Deforest, PA  docusate sodium (COLACE) 100 MG capsule Take 200 mg by mouth daily as needed (when taking pain medications).    Yes [provider]  DULoxetine (CYMBALTA) 60 MG capsule Take 60 mg by mouth daily.  10/26/17  Yes [provider]  famotidine (PEPCID) 20 MG tablet TAKE 1 TABLET (20 MG TOTAL) BY MOUTH DAILY BEFORE SUPPER. Patient taking differently: Take 20 mg by mouth daily before supper.  01/27/16  Yes Sherren Mocha, MD  fenofibrate 160 MG tablet Take 1 tablet (160 mg total) by mouth daily. Patient taking differently: Take 160 mg by mouth at bedtime.  10/08/15  Yes Delfina Redwood, MD  ferrous sulfate 325 (65 FE) MG tablet Take 325 mg by mouth 2 (two) times daily with a meal.   Yes [provider]  furosemide (LASIX) 40 MG tablet Take 40 mg by mouth See admin instructions. Take 40 mg by mouth once a day and may take an additional 40 mg day as needed for weight gain 05/30/17  Yes [provider]  gabapentin  (NEURONTIN) 300 MG capsule TAKE 1 CAPSULE(300 MG) BY MOUTH TWICE DAILY Patient taking differently: Take 300 mg by mouth 2 (two) times daily.  05/05/18  Yes Serafina Mitchell, MD  hydrALAZINE (APRESOLINE) 100 MG tablet Take 100 mg by mouth 3 (three) times daily.   Yes [provider]  HYDROcodone-acetaminophen (NORCO/VICODIN) 5-325 MG tablet Take 1 tablet by mouth every 6 (six) hours as needed for moderate pain. 04/20/18  Yes Rolm Bookbinder, MD  isosorbide mononitrate (IMDUR) 30 MG 24 hr tablet Take 0.5 tablets (15 mg total) by mouth daily. 12/02/15  Yes Sherren Mocha, MD  mirtazapine (REMERON) 7.5 MG tablet Take 7.5 mg by mouth at bedtime.  02/15/18 08/14/18 Yes [provider]  pantoprazole (PROTONIX) 40 MG tablet Take 1 tablet (40 mg total) by mouth daily. 06/26/15  Yes Almyra Deforest, PA  Potassium 99 MG TABS Take 99 mg by mouth daily.    Yes [provider]  promethazine (PHENERGAN) 25 MG tablet Take 1 tablet (25 mg total) by mouth every 6 (six) hours as needed for nausea or vomiting. 05/16/18  Yes Ward, Delice Bison, DO    Inpatient Medications: Scheduled Meds: . allopurinol  100 mg Oral Daily  . ALPRAZolam  0.5 mg Oral QHS  . amLODipine  10 mg Oral Daily  . atorvastatin  40 mg Oral QPM  . carvedilol  25 mg Oral BID WC  . cholecalciferol  5,000 Units Oral Daily  . cloNIDine  0.2 mg Oral TID  . clopidogrel  75 mg Oral Q breakfast  . DULoxetine  60 mg Oral Daily  . famotidine  20 mg Oral QAC supper  . fenofibrate  160 mg Oral QHS  . [START ON 05/30/2018] ferrous sulfate  325 mg Oral Q breakfast  . gabapentin  300 mg Oral BID  . heparin  5,000 Units Subcutaneous Q8H  . hydrALAZINE  100 mg Oral TID  . isosorbide mononitrate  15 mg Oral Daily  . levalbuterol  1.25 mg Nebulization BID  . mirtazapine  7.5 mg Oral QHS  . mometasone-formoterol  2 puff Inhalation BID  . nicotine  21 mg Transdermal Daily  . pantoprazole  40 mg Oral Daily  . potassium chloride  40 mEq Oral  Q4H   Continuous Infusions: . lactated ringers 100 mL/hr at 05/29/18 1102  . piperacillin-tazobactam (ZOSYN)  IV 3.375 g (05/29/18 0617)   PRN Meds: acetaminophen **OR** acetaminophen, hydrALAZINE, HYDROcodone-acetaminophen, HYDROmorphone (DILAUDID) injection, hydrOXYzine, ondansetron (ZOFRAN) IV, zolpidem  Allergies:    Allergies  Allergen Reactions  . Isosorbide Other (See Comments)    Can only tolerate in low doses  . Isosorbide Nitrate Other (See Comments)    Can only tolerate in low doses  . Oxycodone-Acetaminophen Itching  . Codeine Itching  . Contrast Media [Iodinated Diagnostic Agents] Itching and Other (See Comments)    Itching of feet  . Ioxaglate Itching and Other (See Comments)    Itching of feet  . Metrizamide Itching and Other (See Comments)    Itching of feet  . Percocet [Oxycodone-Acetaminophen] Itching and Other (See Comments)    Tolerates Hydrocodone    Social History:   Social History   Socioeconomic History  . Marital status: Widowed    Spouse name: Not on file  . Number of children: Not on file  . Years of education: Not on file  . Highest education level: Not on file  Occupational History  . Occupation: Scientist, water quality in past    Employer: DISABLED  Social Needs  . Financial resource strain: Not on file  . Food insecurity:    Worry: Not on file    Inability: Not on file  . Transportation needs:    Medical: Not on file    Non-medical: Not on file  Tobacco Use  . Smoking status: Current Every Day Smoker    Years: 50.00    Types: Cigarettes  . Smokeless tobacco: Never Used  . Tobacco comment: 3/4 pk per day  Substance and Sexual Activity  . Alcohol use: Yes    Alcohol/week: 0.0 standard drinks    Comment: occasionally  . Drug use: Yes    Types: Marijuana  . Sexual activity: Not on file  Lifestyle  . Physical activity:    Days per week: Not on file    Minutes per session: Not on file  . Stress: Not on file  Relationships  . Social  connections:    Talks on phone: Not on file    Gets together: Not on file    Attends religious service: Not on file    Active member of club or organization: Not on file    Attends meetings of clubs or organizations: Not on file    Relationship status: Not on file  . Intimate partner violence:    Fear of current or ex partner: Not on file    Emotionally abused: Not on file    Physically abused: Not on file    Forced sexual activity: Not on file  Other Topics Concern  . Not on file  Social History Narrative   Widowed in 2009.    Family History:   Family History  Problem Relation Age of Onset  . Emphysema Mother   . Heart disease Mother        before age 17  . Hypertension Mother   . Hyperlipidemia Mother   . Heart attack Mother   . AAA (abdominal aortic aneurysm) Mother        rupture  . Heart attack Father 41  . Emphysema Father   . Heart disease Father        before age 72  . Hyperlipidemia Father   . Hypertension Father   . Peripheral vascular disease Father   . Diabetes  Brother   . Heart disease Brother        before age 72  . Hyperlipidemia Brother   . Hypertension Brother   . Heart attack Brother        CABG  . Heart disease Other        Vascular disease in grandparents, uncles and dad  . Colon cancer Neg Hx    Family Status:  Family Status  Relation Name Status  . Mother  Deceased at age 47       AAA rupture  . Father  Deceased at age 60       Heart attack  . Brother  Alive       Age 39 with CABG this year  . MGM  Deceased  . MGF  Deceased  . PGM  Deceased  . PGF  Deceased  . Other  (Not Specified)  . Neg Hx  (Not Specified)    ROS:  Please see the history of present illness.  All other ROS reviewed and negative.     Physical Exam/Data:   Vitals:   05/29/18 0452 05/29/18 0741 05/29/18 0826 05/29/18 0836  BP: (!) 143/51 (!) 160/61    Pulse: 86 86    Resp: 18 18    Temp: 98.3 F (36.8 C) 98.4 F (36.9 C)    TempSrc: Oral Oral    SpO2:  98% 98% 96% 96%    Intake/Output Summary (Last 24 hours) at 05/29/2018 1148 Last data filed at 05/29/2018 1000 Gross per 24 hour  Intake 433.52 ml  Output 300 ml  Net 133.52 ml   There were no vitals filed for this visit. There is no height or weight on file to calculate BMI.   General: Ill-appearing, NAD Skin: Warm, dry, intact  Head: Normocephalic, atraumatic, clear, moist mucus membranes. Neck: Negative for carotid bruits. No JVD Lungs: Diminished in left upper and lower lobes.  Expiratory wheezes more prominent in right upper and lower lobes. No rales or rhonchi. Breathing is unlabored. Cardiovascular: RRR with S1 S2. + murmur No rubs, gallops, or LV heave appreciated. Abdomen: Soft, tender, non-distended with normoactive bowel sounds.  No obvious abdominal masses. MSK: Strength and tone appear normal for age. 5/5 in all extremities Extremities: No edema. No clubbing or cyanosis. DP/PT pulses 2+ bilaterally Neuro: Alert and oriented. No focal deficits. No facial asymmetry. MAE spontaneously. Psych: Responds to questions appropriately with normal affect.     EKG:  The EKG was personally reviewed and demonstrates: 05/28/2018 NSR with T wave inversions in leads II, III and aVF as well as V1 through V3. Telemetry:  Telemetry was personally reviewed and demonstrates: 05/29/18 NSR HR 60-70's   Relevant CV Studies:  ECHO: 05/29/18 pending  Echocardiogram 03/31/2017: Study Conclusions  - Left ventricle: The cavity size was normal. Systolic function was   normal. The estimated ejection fraction was in the range of 55%   to 60%. Wall motion was normal; there were no regional wall   motion abnormalities. There was a reduced contribution of atrial   contraction to ventricular filling, due to increased ventricular   diastolic pressure or atrial contractile dysfunction. Doppler   parameters are consistent with a reversible restrictive pattern,   indicative of decreased left ventricular  diastolic compliance   and/or increased left atrial pressure (grade 3 diastolic   dysfunction). Doppler parameters are consistent with high   ventricular filling pressure. - Mitral valve: S/P Mitral valve repair with 32m annuloplasty   ring. The findings  are consistent with moderate stenosis. Mean   gradient (D): 8 mm Hg. Peak gradient (D): 21 mm Hg. Valve area by   pressure half-time: 1.59 cm^2. Valve area by continuity equation   (using LVOT flow): 1.2 cm^2. - Left atrium: The atrium was mildly dilated. Anterior-posterior   dimension: 41 mm. - Atrial septum: There was increased thickness of the septum,   consistent with lipomatous hypertrophy.  Impressions:  - Compared to prior echo, there is no significant change in MV mean   gradient (76mHg in 2017 and now 872mg)  CATH: 06/24/2015: 1. Ost LM to LM lesion, 100% stenosed. 2. Ost RCA lesion, 85% stenosed. Prox & RCA lesion, 55% stenosed. 3. Origin to Prox Graft lesion, 90% stenosed. 4. Retrograde flow fills just proximal to PDA-PAV bifurcation. R-L Collaterals via septal to proximal SP1. 5. Mid LAD lesion, 100% stenosed. LIMA Graft unable to be visualized. 6. Elevated LVEDP with low normal EF    The patient has severe native vessel disease as described with left main occlusion and ostial RCA stenosis that is severe.  She has severe stenosis of the proximal SVG-RCA that will require PCI. Fortunately she has elevated creatinine level and we were unable to visualize the LIMA graft.  Plan:  Admit overnight observation for hydration post cath and precath.   Plan will be staged PCI of SVG-RCA tomorrow by Dr. CoBurt KnackIf done via Left Radial approach, this can allow for LIMA-LAD angiography as well.  She'll be loaded with 600 mg Plavix in the Cath Lab and started on Plavix tomorrow.  She will require most Medication with prednisone as well as pre-medication with prednisone, Benadryl and Pepcid tomorrow.  PCI  06/25/2015: Pre-stent angioplasty was not performedA drug-eluting stent was placed. A post-stent angioplasty was not performed. Maximum pressure: 16 atm. The pre-interventional distal flow is normal (TIMI 3). The post-interventional distal flow is normal (TIMI 3). The intervention was successful. No complications occured at this lesion. IC nitroglycerin was given. A multipurpose guide is used. Heparin is used for anticoagulation. A therapeutic ACT is achieved. A cougar wire is used to cross the lesion and is changed out for a 4.0 mm Spider device for distal embolic protection. The lesion is stented with a 3.0x12 mm Promus DES. The stent is post-dilated with a 3.5 mm Kurten balloon to 16 atm.  There is a 10% residual stenosis post intervention.   Laboratory Data:  Chemistry Recent Labs  Lab 05/28/18 1507 05/29/18 0335  NA 141 142  K 3.1* 3.0*  CL 97* 101  CO2 27 28  GLUCOSE 178* 102*  BUN 27* 31*  CREATININE 1.80* 1.91*  CALCIUM 8.1* 7.4*  GFRNONAA 28* 26*  GFRAA 33* 31*  ANIONGAP 17* 13    Total Protein  Date Value Ref Range Status  05/28/2018 5.2 (L) 6.5 - 8.1 g/dL Final   Albumin  Date Value Ref Range Status  05/28/2018 2.4 (L) 3.5 - 5.0 g/dL Final   AST  Date Value Ref Range Status  05/28/2018 18 15 - 41 U/L Final   ALT  Date Value Ref Range Status  05/28/2018 8 0 - 44 U/L Final   Alkaline Phosphatase  Date Value Ref Range Status  05/28/2018 48 38 - 126 U/L Final   Total Bilirubin  Date Value Ref Range Status  05/28/2018 1.7 (H) 0.3 - 1.2 mg/dL Final   Hematology Recent Labs  Lab 05/28/18 1507 05/29/18 0335  WBC 23.7* 14.5*  RBC 3.50* 2.71*  HGB 11.0* 8.4*  HCT 34.2* 26.6*  MCV 97.7 98.2  MCH 31.4 31.0  MCHC 32.2 31.6  RDW 16.7* 16.7*  PLT 339 215   Cardiac Enzymes Recent Labs  Lab 05/28/18 2325 05/29/18 0335 05/29/18 0954  TROPONINI 0.16* 0.35* 0.52*    Recent Labs  Lab 05/28/18 1807  TROPIPOC 0.13*    BNP Recent Labs  Lab 05/28/18 2325   BNP 2,077.1*    DDimer No results for input(s): DDIMER in the last 168 hours. TSH:  Lab Results  Component Value Date   TSH 2.432 Test methodology is 3rd generation TSH 11/02/2007   Lipids: Lab Results  Component Value Date   CHOL 85 05/29/2018   HDL 20 (L) 05/29/2018   LDLCALC 38 05/29/2018   LDLDIRECT 78.9 05/07/2008   TRIG 135 05/29/2018   CHOLHDL 4.3 05/29/2018   HgbA1c: Lab Results  Component Value Date   HGBA1C 5.3 05/29/2018    Radiology/Studies:  Ct Abdomen Pelvis Wo Contrast  Result Date: 05/28/2018 CLINICAL DATA:  New tiny right lung nodule on chest radiograph. Severe right lower quadrant abdominal pain with nausea, vomiting and diarrhea. Chronic kidney disease. Prior cholecystectomy 04/19/2018, hysterectomy and appendectomy. Prior bowel resection. EXAM: CT CHEST, ABDOMEN AND PELVIS WITHOUT CONTRAST TECHNIQUE: Multidetector CT imaging of the chest, abdomen and pelvis was performed following the standard protocol without IV contrast. COMPARISON:  Chest radiograph from earlier today. FINDINGS: CT CHEST FINDINGS Cardiovascular: Borderline mild cardiomegaly. No significant pericardial effusion/thickening. 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 Atherosclerotic nonaneurysmal thoracic aorta. Proximal left subclavian artery stent is noted. Dilated main pulmonary artery (4.0 cm diameter). Mediastinum/Nodes: No discrete thyroid nodules. Unremarkable esophagus. No axillary adenopathy. Mild right paratracheal adenopathy up to 1.1 cm (series 3/image 21). Mildly enlarged 1.1 cm subcarinal node (series 3/image 26). No discrete hilar adenopathy on this noncontrast scan. Lungs/Pleura: No pneumothorax. No pleural effusion. A few tiny calcified granulomas are present in the upper lobes bilaterally. No acute consolidative airspace disease, lung masses or significant pulmonary nodules. Diffuse interlobular septal thickening. Musculoskeletal: No aggressive appearing focal  osseous lesions. Intact sternotomy wires. Moderate thoracic spondylosis. CT ABDOMEN PELVIS FINDINGS Hepatobiliary: Normal liver with no liver mass. Cholecystectomy. Bile ducts are within normal post cholecystectomy limits with CBD diameter 7 mm. Pancreas: Thickening of the pancreatic head and neck. Prominent peripancreatic fat stranding and ill-defined fluid in the pancreatic head and neck, worsened. Poorly defined 5.1 x 2.5 cm anterior pancreatic head complex fluid collection with heterogeneous internal density (series 3/image 66), increased from 2.3 x 1.2 cm. No definite pancreatic duct dilation. Severe wall thickening throughout the adjacent descending duodenum, worsened. Fluid collection inferior to the gallbladder fossa measures 5.0 x 3.0 cm (series 3/image 71) and demonstrates layering hyperdense material, new. Spleen: Normal size. No mass. Adrenals/Urinary Tract: Normal adrenals. No hydronephrosis. No renal stones. Partially exophytic simple 1.3 cm posterior interpolar left renal cyst. No additional contour deforming renal lesions. Normal bladder. Stomach/Bowel: Normal non-distended stomach. Normal caliber small bowel with no small bowel wall thickening. Appendectomy. New segmental wall thickening in the hepatic flexure of the colon. No additional sites of large bowel wall thickening. Stable postsurgical changes from distal large bowel resection. Vascular/Lymphatic: Atherosclerotic abdominal aorta with stable 5.0 cm infrarenal abdominal aortic aneurysm status post aorto bi-iliac stent graft repair. Surgical clips are noted in the right inguinal region. No pathologically enlarged lymph nodes in the abdomen or pelvis. Reproductive: Status post hysterectomy, with no abnormal findings at the vaginal cuff. No adnexal mass. Other: No pneumoperitoneum.  New small volume perihepatic ascites. Musculoskeletal: No aggressive appearing focal osseous lesions. Mild lumbar spondylosis. IMPRESSION: CT CHEST: 1. No lung  masses or significant pulmonary nodules. 2. Borderline mild cardiomegaly. Diffuse interlobular septal thickening suggests mild pulmonary edema. No pleural effusions. 3. Dilated main pulmonary artery, suggesting pulmonary arterial hypertension. CT ABDOMEN PELVIS: 1. Worsening severe inflammatory changes surrounding the pancreatic head and neck, descending duodenum and hepatic flexure of the colon. Enlarging nonspecific complex poorly defined fluid collection in the anterior pancreatic head. New complex fluid collection inferior to the gallbladder fossa with layering hyperdense material. Burtis Junes worsening pancreatitis with enlarging pancreatic pseudocysts. Marked wall thickening in the descending duodenum has worsened, and could be reactive, with peptic ulcer disease difficult to exclude. No free air. Short-term follow-up MRI or CT abdomen with IV contrast suggested. 2. New wall thickening throughout the hepatic flexure of the colon, probably reactive. 3. Cholecystectomy. No intrahepatic biliary ductal dilatation. CBD diameter 7 mm, within normal post cholecystectomy limits. 4. New small volume perihepatic ascites. 5.  Aortic Atherosclerosis (ICD10-I70.0). Electronically Signed   By: Ilona Sorrel M.D.   On: 05/28/2018 20:41   Ct Chest Wo Contrast  Result Date: 05/28/2018 CLINICAL DATA:  New tiny right lung nodule on chest radiograph. Severe right lower quadrant abdominal pain with nausea, vomiting and diarrhea. Chronic kidney disease. Prior cholecystectomy 04/19/2018, hysterectomy and appendectomy. Prior bowel resection. EXAM: CT CHEST, ABDOMEN AND PELVIS WITHOUT CONTRAST TECHNIQUE: Multidetector CT imaging of the chest, abdomen and pelvis was performed following the standard protocol without IV contrast. COMPARISON:  Chest radiograph from earlier today. FINDINGS: CT CHEST FINDINGS Cardiovascular: Borderline mild cardiomegaly. No significant pericardial effusion/thickening. Left main and 3 vessel coronary  atherosclerosis status post CABG. Atherosclerotic nonaneurysmal thoracic aorta. Proximal left subclavian artery stent is noted. Dilated main pulmonary artery (4.0 cm diameter). Mediastinum/Nodes: No discrete thyroid nodules. Unremarkable esophagus. No axillary adenopathy. Mild right paratracheal adenopathy up to 1.1 cm (series 3/image 21). Mildly enlarged 1.1 cm subcarinal node (series 3/image 26). No discrete hilar adenopathy on this noncontrast scan. Lungs/Pleura: No pneumothorax. No pleural effusion. A few tiny calcified granulomas are present in the upper lobes bilaterally. No acute consolidative airspace disease, lung masses or significant pulmonary nodules. Diffuse interlobular septal thickening. Musculoskeletal: No aggressive appearing focal osseous lesions. Intact sternotomy wires. Moderate thoracic spondylosis. CT ABDOMEN PELVIS FINDINGS Hepatobiliary: Normal liver with no liver mass. Cholecystectomy. Bile ducts are within normal post cholecystectomy limits with CBD diameter 7 mm. Pancreas: Thickening of the pancreatic head and neck. Prominent peripancreatic fat stranding and ill-defined fluid in the pancreatic head and neck, worsened. Poorly defined 5.1 x 2.5 cm anterior pancreatic head complex fluid collection with heterogeneous internal density (series 3/image 66), increased from 2.3 x 1.2 cm. No definite pancreatic duct dilation. Severe wall thickening throughout the adjacent descending duodenum, worsened. Fluid collection inferior to the gallbladder fossa measures 5.0 x 3.0 cm (series 3/image 71) and demonstrates layering hyperdense material, new. Spleen: Normal size. No mass. Adrenals/Urinary Tract: Normal adrenals. No hydronephrosis. No renal stones. Partially exophytic simple 1.3 cm posterior interpolar left renal cyst. No additional contour deforming renal lesions. Normal bladder. Stomach/Bowel: Normal non-distended stomach. Normal caliber small bowel with no small bowel wall thickening.  Appendectomy. New segmental wall thickening in the hepatic flexure of the colon. No additional sites of large bowel wall thickening. Stable postsurgical changes from distal large bowel resection. Vascular/Lymphatic: Atherosclerotic abdominal aorta with stable 5.0 cm infrarenal abdominal aortic aneurysm status post aorto bi-iliac stent graft repair. Surgical clips are noted  in the right inguinal region. No pathologically enlarged lymph nodes in the abdomen or pelvis. Reproductive: Status post hysterectomy, with no abnormal findings at the vaginal cuff. No adnexal mass. Other: No pneumoperitoneum.  New small volume perihepatic ascites. Musculoskeletal: No aggressive appearing focal osseous lesions. Mild lumbar spondylosis. IMPRESSION: CT CHEST: 1. No lung masses or significant pulmonary nodules. 2. Borderline mild cardiomegaly. Diffuse interlobular septal thickening suggests mild pulmonary edema. No pleural effusions. 3. Dilated main pulmonary artery, suggesting pulmonary arterial hypertension. CT ABDOMEN PELVIS: 1. Worsening severe inflammatory changes surrounding the pancreatic head and neck, descending duodenum and hepatic flexure of the colon. Enlarging nonspecific complex poorly defined fluid collection in the anterior pancreatic head. New complex fluid collection inferior to the gallbladder fossa with layering hyperdense material. Burtis Junes worsening pancreatitis with enlarging pancreatic pseudocysts. Marked wall thickening in the descending duodenum has worsened, and could be reactive, with peptic ulcer disease difficult to exclude. No free air. Short-term follow-up MRI or CT abdomen with IV contrast suggested. 2. New wall thickening throughout the hepatic flexure of the colon, probably reactive. 3. Cholecystectomy. No intrahepatic biliary ductal dilatation. CBD diameter 7 mm, within normal post cholecystectomy limits. 4. New small volume perihepatic ascites. 5.  Aortic Atherosclerosis (ICD10-I70.0). Electronically  Signed   By: Ilona Sorrel M.D.   On: 05/28/2018 20:41   Dg Chest Portable 1 View  Result Date: 05/28/2018 CLINICAL DATA:  Severe right lower quadrant pain. EXAM: PORTABLE CHEST 1 VIEW COMPARISON:  04/18/2018 FINDINGS: The lungs are clear without focal pneumonia, edema, pneumothorax or pleural effusion. Interstitial markings are diffusely coarsened with chronic features. Tiny nodular density identified right upper lung, superimposed on anterior right second rib. Patient is status post median sternotomy. Left atrial appendage occluded device noted. Vascular stent superimposed on the transverse aorta. The visualized bony structures of the thorax are intact. IMPRESSION: Cardiomegaly with mild vascular congestion or chronic interstitial coarsening. Tiny nodule right upper lung not visible on prior studies and indeterminate. CT chest without contrast could be used to further evaluate. Electronically Signed   By: Misty Stanley M.D.   On: 05/28/2018 17:23   Assessment and Plan:   1. Known CAD s/p CABG with elevated trop; likely demand ischemia in the setting of critical illness: -s/p MVR 2012 with PCI to SVG to right PDA 2016 -Patient presented to [facility] on 05/28/2018 with progressive, ongoing abdominal pain found to have acute recurrent pancreatitis per abdominal imaging -Troponin, 0.12> 0.16> 0.35>0.52 with reports of one episode of brief, fleeting chest tightness without associated symptoms>>>likely in the setting of demand ischemia secondary to acute illness  -Current echocardiogram pending this admission  -Last echocardiogram 03/2017 with normal LVEF and G3DD, NWMA -EKG without acute ischemic changes, T wave inversions>>similar to prior tracings -Continue carvedilol 25, atorvastatin 40, Plavix 75  -No ASA secondary to recent hospitalization for acute GI bleed -Will continue to monitor however not currently a cardiac cath candidate given acute illness, recent GI bleed and drop in Hb level>>once stable from  acute illness, could consider OP stress to definitively evaluate CAD.  -GI consulted given acute, worsening pancreatitis with recommendations for IV fluid hydration>>>will need to be mindful of fluid volume balance given elevated BNP on admission at > 2000  2. Acute on chronic diastolic CHF: -Last echocardiogram 03/31/17 with LVEF of 55-60% with NWMA and G3DD -There was no significant change from prior echo in MV mean gradient (28mHg in 2017 and now 820mg) -BNP upon arrival 2077 -IV fluid hydration indicated per GI notes given  acute, worsening pancreatitis>>will need to monitor closely for lfuid volume overload symptoms and treat accordingly  -Weight, no current weight recorded in Epic>>will need to be strict I&O, daily weights>>>was 123lb at last [facility] office visit 05/15/18 -I&O, net positive 132m  -Continue to monitor for fluid volume overload   3.  Recurrent pancreatitis: -Patient presented with acute abdominal pain, right lower quadrant with an elevated lipase at 399>>>currently 667 -Abdominal CT imaging showed worsening inflammatory change and possible pseudocyst -WBC, 23.7, hemoglobin 8.4> down from 11.0 on 05/28/2018 -Empiric antibiotics per primary team/GI  4.  Peripheral artery disease: -Followed extensively by vascular surgery s/p right femoropopliteal and CEA  -History of left subclavian stenting to improve implant to LIMA graft  4.  AAA: -Followed with serial imaging by vascular surgery s/p endovascular s/p endovascular stenting 53/5456complicated by large RP hematoma -Followed with serial CT  5.  HTN: -Elevated, 160/71, 143/51, 172/76, 167/69 -Continue amlodipine 10, carvedilol 25, clonidine 0.2, hydralazine 100, isosorbide mononitrate 15 -? Diuretic for greater BP control  -No ACE/ARB in the setting of acute on chronic renal failure   6.  Ongoing tobacco use: -Smoking cessation strongly encouraged -Nicotine patch in place  7.  CKD stage IV: -Creatinine, 1.91 today>>>  appears to be improved from most recent baseline of 2.0-3.0 -s/p fistula creation for eventual hemodialysis -Followed by primary team -Continue to avoid nephrotoxic medications -Follow with daily BMET   8.  HLD: -Stable, LDL, 38 on 05/29/2018 -Continue statin  9.  Hypokalemia: -K+, 3.0 today -Replaced per primary team with 862m total   10.  DM2: -Stable, hemoglobin A1c, 5.3 -SSI for glucose control while inpatient status   For questions or updates, please contact [facility] consult [email or web address] for contact info under Cardiology/STEMI.   Signed, Kathyrn DrownP-C [facility] Pager: [phone]/23/2019 11:48 AM  I have seen, examined and evaluated the patient this PM along with JiKathyrn DrownP-C.  After reviewing all the available data and chart, we discussed the patients laboratory, study & physical findings as well as symptoms in detail. I agree with her findings, examination as well as impression recommendations as per our discussion.    Patient presenting with abdominal pain and what appears to be potential recurrent pancreatitis.  She had short episode of chest pain and mild troponin elevation which is probably more consistent with demand ischemia as noted above.  For now we will simply manage medically and consider outpatient stress test once stable.  She herself is not interested in any invasive procedures from a cardiac standpoint at this time.  Major issue is abdominal pain.  Currently getting NG tube placed.    DaGlenetta HewM.D., M.S. Interventional Cardiologist   Pager # [phone] # [phone] [email or web address] [id number]

## 2018-05-29 NOTE — Consult Note (Addendum)
[facility] Consult: 8:19 AM 05/29/2018  LOS: 1 day    Referring Provider: Dr Erlinda Hong  Primary Care Physician:  Bernerd Limbo, MD Primary Gastroenterologist:  Dr. Kinnie Feil in [facility] >> Dr [name].       Reason for Consultation:  Pancreatitis.     HPI: [name] is a 67 y.o. female.  Innumerable medical issues.  Type 2 DM, on no meds for this.  CAD, s/p stents.  CHFd.  PAF, not on AC.  PVD.  S/p right fem pop, CEA, left subclavian stent.   S/p AAA stent graft repair 01/9291, complicated by large RP hematoma.  Mitral valve repair.  Stage 5 CKD, s/p AV fistula for anticipated transition to HD.  Nocturnal oxygen.   Last colonoscopy 2013 [place] by Dr. Dorrene German.  Unable to pass pediatric scope led to virtual colonoscopy, followed by partial colectomy for diverticulitis. Ruptured appendix 2016.    04/12/18 EGD for transfusion requiring ABL and anemia of chronic disease, maroon stools.  Melanosis in D2/D3 likely due to oral iron.  O/w normal study.     Acute, (labelled biliary though normal CBD and normal LFTS) pancreatitis developed during same admission. This was an incidental discovery on CT obtained to eval hx RP hematoma in setting of worsening anemia.     Fup CT 8/13 (previous 04/12/18): stable appearance of pancreatic head and pseudocyst.  Gallstones, sludge.  Stable left RP hematoma though improved c/w 01/2018. [place] 04/13/18: suspicion for gangrenous cholecystitis.   S/p 04/19/18 lap chole, Dr Donne Hazel.  Did not undergo IOC.    Path: chronic cholecystitis.     Colonoscopy set for 07/03/18 at [facility] hospital.      Chronic Plavix, PPI, Pepcid, BID iron.   Seen in ED 9/9 with upper and RLQ abdominal pain, loose but non-bloody stools, minor nausea no vomiting.    CT scan: Persistent fat stranding in the region of the  pancreatic head, duodenum favor persistent versus recurrent pancreatitis versus duodenitis.  Small density in gallbladder fossa, possibly representing gallstone in cystic duct remnant.  Decreased left RP hematoma.  Decreased pleural effusions. Lipase 79.  LFTs normal.  Surgery did not feel she needed admission and she was discharged home.  Since that time she has had ongoing abdominal pain in the upper and right abdomen.  Vicodin which she uses for back pain has not helped her abdominal pain at all.  Mild nausea without vomiting.  Loose stools.  Has hardly been eating or drinking for several days.  Denies consumption of alcohol.  She smokes less than a pack of cigarettes per day.  She has not had any medication adjustments other than discontinuation of Ambien in the last few months. Lipase 399 >> 667.   t bili 1.7.  Alk phos 48.  AST/ALT18/8.   BNP 2077.  Troponin I 0.16 >> 0.35.   No elevation of Lactateor procalcitonin.     WBCs 23.7 >> 14.5.   Non-contrast CT ab/pelvis 05/28/18: 7 mm CBD.  Progressive, severe pancreatitis involving pancreatic head and neck, descending duodenum, hepatic  flexure.  Enlarging, nonspecific, complex, poorly defined, fluid collection in the anterior pancreatic head. New, complex fluid collection inferior to the gallbladder fossa with layering hyperdense material. Burtis Junes worsening pancreatitis with enlarging pancreatic pseudocysts.   New, small peri-hepatic ascites.  Aortic atherosclerosis.     Past Medical History:  Diagnosis Date  . AAA (abdominal aortic aneurysm) ([place]) 5/09   3.7x3.3 by u/s 2009  . Abnormal EKG    deep TW inversions chronic  . Anemia   . CAD (coronary artery disease)    a. CABG 03/2011 LIMA to LAD, SVG to OM, SVG to PDA. b. cath 06/25/2015 DES to SVG to rPDA, patent LIMA to LAD, patent SVG to OM  . carotid stenosis 5/09   S/p L CEA ;  60-79% bilat ICA by preCABG dopplers 7/12  . CHF (congestive heart failure) ([place])   . Chronic back pain    "all  over" (06/24/2015)  . Chronic bronchitis ([place])    "get it pretty much q yr" (06/24/2015)  . CKD (chronic kidney disease)   . Complication of anesthesia 1966   "problem w/ether"  . COPD (chronic obstructive pulmonary disease) ([place])   . Coronary artery disease 5/09   s/p CABG 7/12   L-LAD, S-OM, S-PDA  . Depression   . GERD (gastroesophageal reflux disease)    With hiatal hernia  . GIB (gastrointestinal bleeding) 9/11   S/p EGD with cautery at [facility]  . Heart murmur   . History of blood transfusion "a few times"   "all related to anemia" (06/24/2015)  . History of hiatal hernia   . Hyperlipidemia   . Hypertension   . Mitral regurgitation    3+ MR by intraoperative TEE;  s/p MV repair with Dr. Roxy Manns 7/12  . Myocardial infarction [facility]) 2012 "several"  . On home oxygen therapy    "2 liters at night; negative for sleep apnea"  . PAD (peripheral artery disease) ([facility])    Severe; s/p bilateral renal artery stents, moderate in-stent restenosis  . Paroxysmal atrial fibrillation ([facility])    coumadin;  echo 9/07: EF 60%, mild LVH;  s/p Cox Maze 7/12 with LAA clipping  . Subclavian artery stenosis, left ([facility])    stented by Dr. Trula Slade on 10/18 to help flow of her LIMA to LAD  . Type II diabetes mellitus ([place])     Past Surgical History:  Procedure Laterality Date  . A/V FISTULAGRAM Left 03/21/2018   Procedure: A/V FISTULAGRAM;  Surgeon: Serafina Mitchell, MD;  Location: [place] [facility];  Service: Cardiovascular;  Laterality: Left;  . ABDOMINAL AORTIC ENDOVASCULAR STENT GRAFT N/A 01/17/2018   Procedure: ABDOMINAL AORTIC ENDOVASCULAR STENT GRAFT WITH CO2;  Surgeon: Serafina Mitchell, MD;  Location: [place];  Service: Vascular;  Laterality: N/A;  . ABDOMINAL HYSTERECTOMY  1990's  . APPENDECTOMY  Aug. 11, 2016   Ruptured  . AV FISTULA PLACEMENT Left 11/03/2017   Procedure: ARTERIOVENOUS (AV) FISTULA CREATION LEFT ARM;  Surgeon: Serafina Mitchell, MD;  Location: [place];  Service: Vascular;  Laterality:  Left;  . BOWEL RESECTION  2013  . CARDIAC CATHETERIZATION N/A 06/24/2015   Procedure: Left Heart Cath and Cors/Grafts Angiography;  Surgeon: Leonie Man, MD;  Location: [place] [facility];  Service: Cardiovascular;  Laterality: N/A;  . CARDIAC CATHETERIZATION N/A 06/25/2015   Procedure: Coronary Stent Intervention;  Surgeon: Sherren Mocha, MD;  Location: [place] [facility];  Service: Cardiovascular;  Laterality: N/A;  . CAROTID ENDARTERECTOMY Left   .  CATARACT EXTRACTION Right 03/27/2018  . CHOLECYSTECTOMY N/A 04/19/2018   Procedure: LAPAROSCOPIC CHOLECYSTECTOMY WITH INTRAOPERATIVE CHOLANGIOGRAM;  Surgeon: Rolm Bookbinder, MD;  Location: [place];  Service: General;  Laterality: N/A;  . CORONARY ANGIOPLASTY    . CORONARY ARTERY BYPASS GRAFT  03/05/2011   CABG X3 (LIMA to LAD, SVG to OM, SVG to PDA, EVH via left thigh  . CORONARY STENT PLACEMENT    . ESOPHAGOGASTRODUODENOSCOPY N/A 04/12/2018   Procedure: ESOPHAGOGASTRODUODENOSCOPY (EGD);  Surgeon: [name], MD;  Location: [facility] ENDOSCOPY;  Service: Endoscopy;  Laterality: N/A;  . LACERATION REPAIR Right 1990's   WRIST  . MAZE  03/05/2011   complete biatrial lesion set with clipping of LA appendage  . MITRAL VALVE REPAIR  03/05/2011   9m Memo 3D ring annuloplasty for ischemic MR  . PERIPHERAL VASCULAR BALLOON ANGIOPLASTY Left 03/21/2018   Procedure: PERIPHERAL VASCULAR BALLOON ANGIOPLASTY;  Surgeon: BSerafina Mitchell MD;  [place];  Service: Cardiovascular;  Laterality: Left;  . PERIPHERAL VASCULAR CATHETERIZATION N/A 06/24/2015   Procedure: Aortic Arch Angiography;  Surgeon: VSerafina Mitchell MD;  [facility];  Service: Cardiovascular;  Laterality: N/A;  . PERIPHERAL VASCULAR CATHETERIZATION  06/24/2015   Procedure: Peripheral Vascular Intervention;  Surgeon: VSerafina Mitchell MD;  [facility];  Service: Cardiovascular;;  . PERIPHERAL VASCULAR CATHETERIZATION N/A 06/24/2015    Procedure: Abdominal Aortogram;  Surgeon: VSerafina Mitchell MD;  [place];  Service: Cardiovascular;  Laterality: N/A;  . RENAL ANGIOGRAM Bilateral 09/11/2013   Procedure: RENAL ANGIOGRAM;  Surgeon: VSerafina Mitchell MD;  [facility];  Service: Cardiovascular;  Laterality: Bilateral;  . RIGHT FEMORAL-POPLITEAL BYPASS      Prior to Admission medications   Medication Sig Start Date End Date Taking? Authorizing Provider  acetaminophen (TYLENOL) 325 MG tablet Take 325 mg by mouth every 6 (six) hours as needed (for pain).   Yes [provider]  albuterol (PROVENTIL HFA;VENTOLIN HFA) 108 (90 Base) MCG/ACT inhaler Inhale 2 puffs into the lungs every 4 (four) hours as needed for wheezing or shortness of breath. ProAir Patient taking differently: Inhale 2 puffs into the lungs every 4 (four) hours as needed for wheezing or shortness of breath.  10/08/15  Yes SDelfina Redwood MD  allopurinol (ZYLOPRIM) 100 MG tablet Take 100 mg by mouth daily. 02/02/17  Yes [provider]  ALPRAZolam (Duanne Moron 0.5 MG tablet Take 0.5 mg by mouth at bedtime.    Yes [provider]  amLODipine (NORVASC) 10 MG tablet Take 10 mg by mouth daily.     Yes [provider]  atorvastatin (LIPITOR) 40 MG tablet Take 40 mg by mouth every evening.  12/07/17  Yes [provider]  budesonide-formoterol (SYMBICORT) 160-4.5 MCG/ACT inhaler Inhale 2 puffs into the lungs 2 (two) times daily as needed (for flares).   Yes [provider]  carvedilol (COREG) 25 MG tablet TAKE 1 TABLET BY MOUTH 2 TIMES A DAY WITH A MEAL Patient taking differently: Take 25 mg by mouth 2 (two) times daily with a meal.  11/04/15  Yes GBurtis Junes NP  Cholecalciferol (VITAMIN D3) 5000 units TABS Take 5,000 Units by mouth daily.   Yes [provider]  cloNIDine (CATAPRES) 0.2 MG tablet Take 1 tablet (0.2 mg total) by mouth 3 (three) times daily. 07/10/15  Yes WRichardson DoppT, PA-C    clopidogrel (PLAVIX) 75 MG tablet Take 1 tablet (75 mg total) by  mouth daily with breakfast. 06/26/15  Yes Almyra Deforest, PA  docusate sodium (COLACE) 100 MG capsule Take 200 mg by mouth daily as needed (when taking pain medications).    Yes [provider]  DULoxetine (CYMBALTA) 60 MG capsule Take 60 mg by mouth daily.  10/26/17  Yes [provider]  famotidine (PEPCID) 20 MG tablet TAKE 1 TABLET (20 MG TOTAL) BY MOUTH DAILY BEFORE SUPPER. Patient taking differently: Take 20 mg by mouth daily before supper.  01/27/16  Yes Sherren Mocha, MD  fenofibrate 160 MG tablet Take 1 tablet (160 mg total) by mouth daily. Patient taking differently: Take 160 mg by mouth at bedtime.  10/08/15  Yes Delfina Redwood, MD  ferrous sulfate 325 (65 FE) MG tablet Take 325 mg by mouth 2 (two) times daily with a meal.   Yes [provider]  furosemide (LASIX) 40 MG tablet Take 40 mg by mouth See admin instructions. Take 40 mg by mouth once a day and may take an additional 40 mg day as needed for weight gain 05/30/17  Yes [provider]  gabapentin (NEURONTIN) 300 MG capsule TAKE 1 CAPSULE(300 MG) BY MOUTH TWICE DAILY Patient taking differently: Take 300 mg by mouth 2 (two) times daily.  05/05/18  Yes Serafina Mitchell, MD  hydrALAZINE (APRESOLINE) 100 MG tablet Take 100 mg by mouth 3 (three) times daily.   Yes [provider]  HYDROcodone-acetaminophen (NORCO/VICODIN) 5-325 MG tablet Take 1 tablet by mouth every 6 (six) hours as needed for moderate pain. 04/20/18  Yes Rolm Bookbinder, MD  isosorbide mononitrate (IMDUR) 30 MG 24 hr tablet Take 0.5 tablets (15 mg total) by mouth daily. 12/02/15  Yes Sherren Mocha, MD  mirtazapine (REMERON) 7.5 MG tablet Take 7.5 mg by mouth at bedtime.  02/15/18 08/14/18 Yes [provider]  pantoprazole (PROTONIX) 40 MG tablet Take 1 tablet (40 mg total) by mouth daily. 06/26/15  Yes Almyra Deforest, PA  Potassium 99 MG TABS Take 99 mg by  mouth daily.    Yes [provider]  promethazine (PHENERGAN) 25 MG tablet Take 1 tablet (25 mg total) by mouth every 6 (six) hours as needed for nausea or vomiting. 05/16/18  Yes Ward, Delice Bison, DO    Scheduled Meds: . allopurinol  100 mg Oral Daily  . ALPRAZolam  0.5 mg Oral QHS  . amLODipine  10 mg Oral Daily  . atorvastatin  40 mg Oral QPM  . carvedilol  25 mg Oral BID WC  . cholecalciferol  5,000 Units Oral Daily  . cloNIDine  0.2 mg Oral TID  . clopidogrel  75 mg Oral Q breakfast  . DULoxetine  60 mg Oral Daily  . famotidine  20 mg Oral QAC supper  . fenofibrate  160 mg Oral QHS  . ferrous sulfate  325 mg Oral BID WC  . gabapentin  300 mg Oral BID  . heparin  5,000 Units Subcutaneous Q8H  . hydrALAZINE  100 mg Oral TID  . isosorbide mononitrate  15 mg Oral Daily  . isosorbide mononitrate  15 mg Oral Daily  . levalbuterol  1.25 mg Nebulization BID  . mirtazapine  7.5 mg Oral QHS  . mometasone-formoterol  2 puff Inhalation BID  . nicotine  21 mg Transdermal Daily  . pantoprazole  40 mg Oral Daily   Infusions: . sodium chloride 75 mL/hr at 05/28/18 2338  . magnesium sulfate 1 - 4 g bolus IVPB    . piperacillin-tazobactam (ZOSYN)  IV 3.375 g (05/29/18 0617)   PRN Meds: acetaminophen **OR** acetaminophen, hydrALAZINE, HYDROcodone-acetaminophen, HYDROmorphone (DILAUDID) injection, hydrOXYzine, zolpidem   Allergies as of 05/28/2018 - Review Complete 05/28/2018  Allergen Reaction Noted  . Isosorbide Other (See Comments) 10/28/2015  . Isosorbide nitrate Other (See Comments) 10/28/2015  . Oxycodone-acetaminophen Itching 10/28/2015  . Codeine Itching 10/28/2015  . Contrast media [iodinated diagnostic agents] Itching and Other (See Comments) 05/29/2012  . Ioxaglate Itching and Other (See Comments) 02/24/2015  . Metrizamide Itching and Other (See Comments) 07/03/2014  . Percocet [oxycodone-acetaminophen] Itching and Other (See Comments) 09/04/2013    Family History   Problem Relation Age of Onset  . Emphysema Mother   . Heart disease Mother        before age 29  . Hypertension Mother   . Hyperlipidemia Mother   . Heart attack Mother   . AAA (abdominal aortic aneurysm) Mother        rupture  . Heart attack Father 28  . Emphysema Father   . Heart disease Father        before age 65  . Hyperlipidemia Father   . Hypertension Father   . Peripheral vascular disease Father   . Diabetes Brother   . Heart disease Brother        before age 32  . Hyperlipidemia Brother   . Hypertension Brother   . Heart attack Brother        CABG  . Heart disease Other        Vascular disease in grandparents, uncles and dad  . Colon cancer Neg Hx     Social History   Socioeconomic History  . Marital status: Widowed    Spouse name: Not on file  . Number of children: Not on file  . Years of education: Not on file  . Highest education level: Not on file  Occupational History  . Occupation: Scientist, water quality in past    Employer: DISABLED  Social Needs  . Financial resource strain: Not on file  . Food insecurity:    Worry: Not on file    Inability: Not on file  . Transportation needs:    Medical: Not on file    Non-medical: Not on file  Tobacco Use  . Smoking status: Current Every Day Smoker    Years: 50.00    Types: Cigarettes  . Smokeless tobacco: Never Used  . Tobacco comment: 3/4 pk per day  Substance and Sexual Activity  . Alcohol use: Yes    Alcohol/week: 0.0 standard drinks    Comment: occasionally  . Drug use: Yes    Types: Marijuana  . Sexual activity: Not on file  Lifestyle  . Physical activity:    Days per week: Not on file    Minutes per session: Not on file  . Stress: Not on file  Relationships  . Social connections:    Talks on phone: Not on file    Gets together: Not on file    Attends religious service: Not on file    Active member of club or organization: Not on file    Attends meetings of clubs or organizations: Not on file     Relationship status: Not on file  . Intimate partner violence:    Fear of current or ex partner: Not on file    Emotionally abused: Not on file    Physically abused: Not on file    Forced sexual activity: Not on file  Other Topics Concern  . Not on  file  Social History Narrative   Widowed in 2009.    REVIEW OF SYSTEMS: Constitutional: Malaise, feels awful. ENT:  No nose bleeds Pulm: No new shortness of breath.  Stable dyspnea on exertion and stable, mostly nonproductive cough.  Uses oxygen at night. CV:  No palpitations, no LE edema.  No chest pain GU: Decreased urinary volume and frequency. GI:  Per HPI Heme: Denies unusual or excessive bleeding/bruising. Transfusions:  Per HPI Neuro:  No headaches, no peripheral tingling or numbness Derm:  No itching, no rash or sores.  Endocrine: Mild chills but no Reiger's, no sweats. Immunization:  Not queried.   Travel:  None beyond local counties in last few months.    PHYSICAL EXAM: Vital signs in last 24 hours: Vitals:   05/29/18 0452 05/29/18 0741  BP: (!) 143/51 (!) 160/61  Pulse: 86 86  Resp: 18 18  Temp: 98.3 F (36.8 C)   SpO2: 98% 98%   Wt Readings from Last 3 Encounters:  05/15/18 55.8 kg  04/28/18 60.3 kg  04/20/18 64.4 kg    General: Looks older than stated age.  Looks acutely and chronically ill but is alert and able to provide a good history. Head: No facial asymmetry or swelling.  No signs of head trauma. Eyes: No scleral icterus.  No conjunctival pallor.  EOMI. Ears: Not HOH Nose: No discharge or congestion. Mouth: Dentulous.  Tongue midline.  Oral mucosa pink, moist, clear. Neck: No JVD, no masses, no thyromegaly. Lungs: Clear bilaterally.  No labored breathing.  No cough. Heart: RRR.  No MRG Abdomen: Nondistended.  Bowel sounds hypoactive but none high-pitched or tinkling.  Tender in most areas especially the right abdomen and bilateral upper abdomen.  No guarding or rebound..   Rectal:  Deferred Musc/Skeltl: No joints redness, swelling, deformity. Extremities: Feet are warm.  Toes a bit dusky with reduced capillary refill.  No tenderness to the feet. Neurologic: Alert.  Oriented x3.  Moves all 4 limbs, strength not tested. Skin: No suspicious lesions.  No rashes.  No significant open sores. Nodes: No cervical adenopathy. Psych: Cooperative, pleasant, affect flat consistent with her being in pain and feeling poorly.  Intake/Output from previous day: 09/22 0701 - 09/23 0700 In: 433.5 [I.V.:433.5] Out: 0  Intake/Output this shift: No intake/output data recorded.  LAB RESULTS: Recent Labs    05/28/18 1507 05/29/18 0335  WBC 23.7* 14.5*  HGB 11.0* 8.4*  HCT 34.2* 26.6*  PLT 339 215   BMET Lab Results  Component Value Date   NA 142 05/29/2018   NA 141 05/28/2018   NA 140 05/15/2018   K 3.0 (L) 05/29/2018   K 3.1 (L) 05/28/2018   K 3.1 (L) 05/15/2018   CL 101 05/29/2018   CL 97 (L) 05/28/2018   CL 100 05/15/2018   CO2 28 05/29/2018   CO2 27 05/28/2018   CO2 27 05/15/2018   GLUCOSE 102 (H) 05/29/2018   GLUCOSE 178 (H) 05/28/2018   GLUCOSE 183 (H) 05/15/2018   BUN 31 (H) 05/29/2018   BUN 27 (H) 05/28/2018   BUN 20 05/15/2018   CREATININE 1.91 (H) 05/29/2018   CREATININE 1.80 (H) 05/28/2018   CREATININE 1.92 (H) 05/15/2018   CALCIUM 7.4 (L) 05/29/2018   CALCIUM 8.1 (L) 05/28/2018   CALCIUM 8.1 (L) 05/15/2018   LFT Recent Labs    05/28/18 1507  PROT 5.2*  ALBUMIN 2.4*  AST 18  ALT 8  ALKPHOS 48  BILITOT 1.7*   PT/INR Lab Results  Component Value Date   INR 1.13 05/28/2018   INR 1.24 04/09/2018   INR 1.22 01/17/2018   Hepatitis Panel No results for input(s): HEPBSAG, HCVAB, HEPAIGM, HEPBIGM in the last 72 hours. C-Diff No components found for: CDIFF Lipase     Component Value Date/Time   LIPASE 667 (H) 05/29/2018 0335    Drugs of Abuse  No results found for: LABOPIA, COCAINSCRNUR, LABBENZ, AMPHETMU, THCU, LABBARB   RADIOLOGY  STUDIES: Ct Abdomen Pelvis Wo Contrast  Result Date: 05/28/2018 CLINICAL DATA:  New tiny right lung nodule on chest radiograph. Severe right lower quadrant abdominal pain with nausea, vomiting and diarrhea. Chronic kidney disease. Prior cholecystectomy 04/19/2018, hysterectomy and appendectomy. Prior bowel resection. EXAM: CT CHEST, ABDOMEN AND PELVIS WITHOUT CONTRAST TECHNIQUE: Multidetector CT imaging of the chest, abdomen and pelvis was performed following the standard protocol without IV contrast. COMPARISON:  Chest radiograph from earlier today. FINDINGS: CT CHEST FINDINGS Cardiovascular: Borderline mild cardiomegaly. No significant pericardial effusion/thickening. 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 Atherosclerotic nonaneurysmal thoracic aorta. Proximal left subclavian artery stent is noted. Dilated main pulmonary artery (4.0 cm diameter). Mediastinum/Nodes: No discrete thyroid nodules. Unremarkable esophagus. No axillary adenopathy. Mild right paratracheal adenopathy up to 1.1 cm (series 3/image 21). Mildly enlarged 1.1 cm subcarinal node (series 3/image 26). No discrete hilar adenopathy on this noncontrast scan. Lungs/Pleura: No pneumothorax. No pleural effusion. A few tiny calcified granulomas are present in the upper lobes bilaterally. No acute consolidative airspace disease, lung masses or significant pulmonary nodules. Diffuse interlobular septal thickening. Musculoskeletal: No aggressive appearing focal osseous lesions. Intact sternotomy wires. Moderate thoracic spondylosis. CT ABDOMEN PELVIS FINDINGS Hepatobiliary: Normal liver with no liver mass. Cholecystectomy. Bile ducts are within normal post cholecystectomy limits with CBD diameter 7 mm. Pancreas: Thickening of the pancreatic head and neck. Prominent peripancreatic fat stranding and ill-defined fluid in the pancreatic head and neck, worsened. Poorly defined 5.1 x 2.5 cm anterior pancreatic head complex fluid  collection with heterogeneous internal density (series 3/image 66), increased from 2.3 x 1.2 cm. No definite pancreatic duct dilation. Severe wall thickening throughout the adjacent descending duodenum, worsened. Fluid collection inferior to the gallbladder fossa measures 5.0 x 3.0 cm (series 3/image 71) and demonstrates layering hyperdense material, new. Spleen: Normal size. No mass. Adrenals/Urinary Tract: Normal adrenals. No hydronephrosis. No renal stones. Partially exophytic simple 1.3 cm posterior interpolar left renal cyst. No additional contour deforming renal lesions. Normal bladder. Stomach/Bowel: Normal non-distended stomach. Normal caliber small bowel with no small bowel wall thickening. Appendectomy. New segmental wall thickening in the hepatic flexure of the colon. No additional sites of large bowel wall thickening. Stable postsurgical changes from distal large bowel resection. Vascular/Lymphatic: Atherosclerotic abdominal aorta with stable 5.0 cm infrarenal abdominal aortic aneurysm status post aorto bi-iliac stent graft repair. Surgical clips are noted in the right inguinal region. No pathologically enlarged lymph nodes in the abdomen or pelvis. Reproductive: Status post hysterectomy, with no abnormal findings at the vaginal cuff. No adnexal mass. Other: No pneumoperitoneum.  New small volume perihepatic ascites. Musculoskeletal: No aggressive appearing focal osseous lesions. Mild lumbar spondylosis. IMPRESSION: CT CHEST: 1. No lung masses or significant pulmonary nodules. 2. Borderline mild cardiomegaly. Diffuse interlobular septal thickening suggests mild pulmonary edema. No pleural effusions. 3. Dilated main pulmonary artery, suggesting pulmonary arterial hypertension. CT ABDOMEN PELVIS: 1. Worsening severe inflammatory changes surrounding the pancreatic head and neck, descending duodenum and hepatic flexure of the colon. Enlarging nonspecific complex poorly defined fluid collection in the  anterior pancreatic head. New complex fluid collection inferior to the gallbladder fossa with layering hyperdense material. Burtis Junes worsening pancreatitis with enlarging pancreatic pseudocysts. Marked wall thickening in the descending duodenum has worsened, and could be reactive, with peptic ulcer disease difficult to exclude. No free air. Short-term follow-up MRI or CT abdomen with IV contrast suggested. 2. New wall thickening throughout the hepatic flexure of the colon, probably reactive. 3. Cholecystectomy. No intrahepatic biliary ductal dilatation. CBD diameter 7 mm, within normal post cholecystectomy limits. 4. New small volume perihepatic ascites. 5.  Aortic Atherosclerosis (ICD10-I70.0). Electronically Signed   By: Ilona Sorrel M.D.   On: 05/28/2018 20:41   Ct Chest Wo Contrast  Result Date: 05/28/2018 CLINICAL DATA:  New tiny right lung nodule on chest radiograph. Severe right lower quadrant abdominal pain with nausea, vomiting and diarrhea. Chronic kidney disease. Prior cholecystectomy 04/19/2018, hysterectomy and appendectomy. Prior bowel resection. EXAM: CT CHEST, ABDOMEN AND PELVIS WITHOUT CONTRAST TECHNIQUE: Multidetector CT imaging of the chest, abdomen and pelvis was performed following the standard protocol without IV contrast. COMPARISON:  Chest radiograph from earlier today. FINDINGS: CT CHEST FINDINGS Cardiovascular: Borderline mild cardiomegaly. No significant pericardial effusion/thickening. 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 Atherosclerotic nonaneurysmal thoracic aorta. Proximal left subclavian artery stent is noted. Dilated main pulmonary artery (4.0 cm diameter). Mediastinum/Nodes: No discrete thyroid nodules. Unremarkable esophagus. No axillary adenopathy. Mild right paratracheal adenopathy up to 1.1 cm (series 3/image 21). Mildly enlarged 1.1 cm subcarinal node (series 3/image 26). No discrete hilar adenopathy on this noncontrast scan. Lungs/Pleura: No  pneumothorax. No pleural effusion. A few tiny calcified granulomas are present in the upper lobes bilaterally. No acute consolidative airspace disease, lung masses or significant pulmonary nodules. Diffuse interlobular septal thickening. Musculoskeletal: No aggressive appearing focal osseous lesions. Intact sternotomy wires. Moderate thoracic spondylosis. CT ABDOMEN PELVIS FINDINGS Hepatobiliary: Normal liver with no liver mass. Cholecystectomy. Bile ducts are within normal post cholecystectomy limits with CBD diameter 7 mm. Pancreas: Thickening of the pancreatic head and neck. Prominent peripancreatic fat stranding and ill-defined fluid in the pancreatic head and neck, worsened. Poorly defined 5.1 x 2.5 cm anterior pancreatic head complex fluid collection with heterogeneous internal density (series 3/image 66), increased from 2.3 x 1.2 cm. No definite pancreatic duct dilation. Severe wall thickening throughout the adjacent descending duodenum, worsened. Fluid collection inferior to the gallbladder fossa measures 5.0 x 3.0 cm (series 3/image 71) and demonstrates layering hyperdense material, new. Spleen: Normal size. No mass. Adrenals/Urinary Tract: Normal adrenals. No hydronephrosis. No renal stones. Partially exophytic simple 1.3 cm posterior interpolar left renal cyst. No additional contour deforming renal lesions. Normal bladder. Stomach/Bowel: Normal non-distended stomach. Normal caliber small bowel with no small bowel wall thickening. Appendectomy. New segmental wall thickening in the hepatic flexure of the colon. No additional sites of large bowel wall thickening. Stable postsurgical changes from distal large bowel resection. Vascular/Lymphatic: Atherosclerotic abdominal aorta with stable 5.0 cm infrarenal abdominal aortic aneurysm status post aorto bi-iliac stent graft repair. Surgical clips are noted in the right inguinal region. No pathologically enlarged lymph nodes in the abdomen or pelvis. Reproductive:  Status post hysterectomy, with no abnormal findings at the vaginal cuff. No adnexal mass. Other: No pneumoperitoneum.  New small volume perihepatic ascites. Musculoskeletal: No aggressive appearing focal osseous lesions. Mild lumbar spondylosis. IMPRESSION: CT CHEST: 1. No lung masses or significant pulmonary nodules. 2. Borderline mild cardiomegaly. Diffuse interlobular septal thickening suggests mild pulmonary edema. No pleural effusions. 3. Dilated main pulmonary artery, suggesting pulmonary  arterial hypertension. CT ABDOMEN PELVIS: 1. Worsening severe inflammatory changes surrounding the pancreatic head and neck, descending duodenum and hepatic flexure of the colon. Enlarging nonspecific complex poorly defined fluid collection in the anterior pancreatic head. New complex fluid collection inferior to the gallbladder fossa with layering hyperdense material. Burtis Junes worsening pancreatitis with enlarging pancreatic pseudocysts. Marked wall thickening in the descending duodenum has worsened, and could be reactive, with peptic ulcer disease difficult to exclude. No free air. Short-term follow-up MRI or CT abdomen with IV contrast suggested. 2. New wall thickening throughout the hepatic flexure of the colon, probably reactive. 3. Cholecystectomy. No intrahepatic biliary ductal dilatation. CBD diameter 7 mm, within normal post cholecystectomy limits. 4. New small volume perihepatic ascites. 5.  Aortic Atherosclerosis (ICD10-I70.0). Electronically Signed   By: Ilona Sorrel M.D.   On: 05/28/2018 20:41   Dg Chest Portable 1 View  Result Date: 05/28/2018 CLINICAL DATA:  Severe right lower quadrant pain. EXAM: PORTABLE CHEST 1 VIEW COMPARISON:  04/18/2018 FINDINGS: The lungs are clear without focal pneumonia, edema, pneumothorax or pleural effusion. Interstitial markings are diffusely coarsened with chronic features. Tiny nodular density identified right upper lung, superimposed on anterior right second rib. Patient is  status post median sternotomy. Left atrial appendage occluded device noted. Vascular stent superimposed on the transverse aorta. The visualized bony structures of the thorax are intact. IMPRESSION: Cardiomegaly with mild vascular congestion or chronic interstitial coarsening. Tiny nodule right upper lung not visible on prior studies and indeterminate. CT chest without contrast could be used to further evaluate. Electronically Signed   By: Misty Stanley M.D.   On: 05/28/2018 17:23     IMPRESSION:   *   Recurrent vs never resolved acute pancreatitis. S/p 04/19/18 lap chole for biliary pancreatitis. LFTs with minor elevation T bili but o/w normal as was case 5 weeks ago at diagnosis.    *   GIB, acute on chronic anemia 04/2018.    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    *    Chronic anemia.  Per iron studies of 04/10/2018, she was not iron deficient.  Hgb has dropped from 11 to 8.4, however 11 reflects hemoconcentration in a patient who had not eaten or drank much for several days.  Her Hgb of 8.4 is entirely within her normal baseline range.  Takes daily oral iron.    *   ASPVD, CAD.  Chronic Plavix, not on hold.    *    S/p 2013 colon resection for complicated diverticular dz.  Failed colonoscopy pre-op.    *   Stage 5 CKD.  S/p fistula creation for eventual hemodialysis.    *   Small RUL lung nodule.  Paratracheal and subcarinal adenopathy.  Per CT.    *   CHF.  Elevated BNP and Troponin I.  Not SOB.     PLAN:     *  Will d/w Dr Rush Landmark.  ? Repeat ultrasound, ? MRCP, ? ERCP?    *   Currently not on IVF.  Management of IV fluids is complicated given her elevated BNP, however in pancreatitis aggressive IV fluids are indicated.  per d/w Dr Jerilynn Mages, wrote for LR at 100/hour for 6 hours, observe for overt HF (dyspnea)  *  RD consult for tube feedings and PP coretrack placement.    *   ESR in AM   Azucena Freed  05/29/2018, 8:19 AM Phone [phone]

## 2018-05-29 NOTE — Progress Notes (Signed)
Initial Nutrition Assessment  DOCUMENTATION CODES:   Severe malnutrition in context of chronic illness  INTERVENTION:   -Vital AF 1.2 @ 40 ml/hr via Cortrak -Increase by 10 ml Q8 hours to goal rate of 60 ml/hr (1440 ml)  At goal rate TF provides: 1728 kcals, 108 grams protein, 1166 ml free water. Meets 100% of needs.   NUTRITION DIAGNOSIS:   Severe Malnutrition related to chronic illness(CHF, AAA s/p graft repair, pancreatitis) as evidenced by severe muscle depletion, moderate muscle depletion, mild fat depletion, moderate fat depletion, energy intake < or equal to 75% for > or equal to 1 month.  GOAL:   Patient will meet greater than or equal to 90% of their needs   MONITOR:   Diet advancement, Labs, TF tolerance, I & O's, Weight trends  REASON FOR ASSESSMENT:   Consult Enteral/tube feeding initiation and management  ASSESSMENT:   Patient with PMH significant for HTN, HLD, DM, COPD, GERD, gout, depression with anxiety, tobacco abuse, AAA s/p stent graft repair 01/2018, CAD, CABG, CHF, CKD IV s/p AV fistula for anticipated transition to HD, GI bleeding, mitral valve regurgitation, PAD, and s/p lap chole 04/19/18. Presents this admission with abdominal pain. Admitted for recurrent vs never resolved acute pancreatitis and GIB.    Pt endorses having a poor appetite since her stent graft repair in May 2019. States during this time period she would typically eat two meals daily that consist of canned soups, canned spaghetti with meatballs, and TV dinners. She does not eat breakfast or drink supplementation. Over the last week, pt claims she didn't eat anything and drank only water due to ongoing severe abdominal pain. Pt denies any nausea, vomiting, or issues with swallowing. She complains of heartburn like symptoms upon drinking water this admission. She is currently NPO. This RD placed cortrak feeding tube (tip at the pylorus). Will initiate TF. Pt amenable to supplementation once diet  os advanced.   Pt claims to have weighed 135 lb prior to her surgery in May 2019. Records indicate pt weighed 141 lb 01/27/18 and 123 lb on 05/15/18 (12.7% wt loss in 4 months, significant for time frame). Pt does not have a weight for this admission. Will need to obtain new weight to determine recent wt loss. Suspect weight continue to decline. Nutrition-Focused physical exam completed.   Medications reviewed and include: Vit D, ferrous sulfate, Remeron once daily, LR @ 100 ml/hr Labs reviewed: K 3.0 (L) Mg 1.3 (L) Lipase 667 (H)   NUTRITION - FOCUSED PHYSICAL EXAM:    Most Recent Value  Orbital Region  No depletion  Upper Arm Region  Moderate depletion  Thoracic and Lumbar Region  No depletion  Buccal Region  Mild depletion  Temple Region  Mild depletion  Clavicle Bone Region  Mild depletion  Clavicle and Acromion Bone Region  Mild depletion  Scapular Bone Region  No depletion  Dorsal Hand  Mild depletion  Patellar Region  Severe depletion  Anterior Thigh Region  Severe depletion  Posterior Calf Region  Severe depletion  Edema (RD Assessment)  None     Diet Order:   Diet Order            Diet NPO time specified Except for: [employer], Sips with Meds  Diet effective midnight              EDUCATION NEEDS:   Not appropriate for education at this time  Skin:  Skin Assessment: Reviewed RN Assessment  Last BM:  05/28/18  Height:   Ht Readings from Last 1 Encounters:  05/15/18 5\' 5"  (1.651 m)    Weight:   Wt Readings from Last 1 Encounters:  05/15/18 55.8 kg    Ideal Body Weight:  56.8 kg  BMI:  There is no height or weight on file to calculate BMI.  Estimated Nutritional Needs:   Kcal:  1700-1900 kcal  Protein:  90-105 grams  Fluid:  >/= 1.7 L/day  [name] RD, LDN Clinical Nutrition Pager # - [phone]

## 2018-05-29 NOTE — Progress Notes (Signed)
Paged GI reference LR @ 100.  Azucena Freed, PA-C V/O Yes continue LR @ 100 for  Hours.

## 2018-05-29 NOTE — Progress Notes (Signed)
Paged GI, Azucena Freed, PA-C.  Pain RLQ still 8 after IV dilaudid.  Clarification tube feeding order dc'd at this time?

## 2018-05-30 ENCOUNTER — Encounter (HOSPITAL_COMMUNITY): Payer: Self-pay | Admitting: General Practice

## 2018-05-30 ENCOUNTER — Inpatient Hospital Stay (HOSPITAL_COMMUNITY): Payer: Medicare Other

## 2018-05-30 DIAGNOSIS — I340 Nonrheumatic mitral (valve) insufficiency: Secondary | ICD-10-CM

## 2018-05-30 DIAGNOSIS — R1084 Generalized abdominal pain: Secondary | ICD-10-CM

## 2018-05-30 LAB — URINALYSIS, ROUTINE W REFLEX MICROSCOPIC
BILIRUBIN URINE: NEGATIVE
GLUCOSE, UA: NEGATIVE mg/dL
HGB URINE DIPSTICK: NEGATIVE
Ketones, ur: 5 mg/dL — AB
Nitrite: NEGATIVE
PROTEIN: NEGATIVE mg/dL
Specific Gravity, Urine: 1.021 (ref 1.005–1.030)
pH: 5 (ref 5.0–8.0)

## 2018-05-30 LAB — COMPREHENSIVE METABOLIC PANEL
ALBUMIN: 1.9 g/dL — AB (ref 3.5–5.0)
ALK PHOS: 36 U/L — AB (ref 38–126)
ALT: 8 U/L (ref 0–44)
AST: 17 U/L (ref 15–41)
Anion gap: 16 — ABNORMAL HIGH (ref 5–15)
BUN: 35 mg/dL — ABNORMAL HIGH (ref 8–23)
CALCIUM: 7.7 mg/dL — AB (ref 8.9–10.3)
CHLORIDE: 100 mmol/L (ref 98–111)
CO2: 24 mmol/L (ref 22–32)
Creatinine, Ser: 2.27 mg/dL — ABNORMAL HIGH (ref 0.44–1.00)
GFR calc Af Amer: 25 mL/min — ABNORMAL LOW (ref >60)
GFR calc non Af Amer: 21 mL/min — ABNORMAL LOW (ref >60)
GLUCOSE: 149 mg/dL — AB (ref 70–99)
POTASSIUM: 4.1 mmol/L (ref 3.5–5.1)
SODIUM: 140 mmol/L (ref 135–145)
Total Bilirubin: 1.8 mg/dL — ABNORMAL HIGH (ref 0.3–1.2)
Total Protein: 4.9 g/dL — ABNORMAL LOW (ref 6.5–8.1)

## 2018-05-30 LAB — MRSA PCR SCREENING: MRSA by PCR: NEGATIVE

## 2018-05-30 LAB — GLUCOSE, CAPILLARY
GLUCOSE-CAPILLARY: 119 mg/dL — AB (ref 70–99)
GLUCOSE-CAPILLARY: 134 mg/dL — AB (ref 70–99)
GLUCOSE-CAPILLARY: 137 mg/dL — AB (ref 70–99)
GLUCOSE-CAPILLARY: 155 mg/dL — AB (ref 70–99)
Glucose-Capillary: 234 mg/dL — ABNORMAL HIGH (ref 70–99)
Glucose-Capillary: 258 mg/dL — ABNORMAL HIGH (ref 70–99)

## 2018-05-30 LAB — CBC
HCT: 27.1 % — ABNORMAL LOW (ref 36.0–46.0)
HEMOGLOBIN: 8.4 g/dL — AB (ref 12.0–15.0)
MCH: 31 pg (ref 26.0–34.0)
MCHC: 31 g/dL (ref 30.0–36.0)
MCV: 100 fL (ref 78.0–100.0)
Platelets: 220 10*3/uL (ref 150–400)
RBC: 2.71 MIL/uL — ABNORMAL LOW (ref 3.87–5.11)
RDW: 16.5 % — ABNORMAL HIGH (ref 11.5–15.5)
WBC: 14.4 10*3/uL — ABNORMAL HIGH (ref 4.0–10.5)

## 2018-05-30 LAB — ECHOCARDIOGRAM COMPLETE: Weight: 1996.49 oz

## 2018-05-30 LAB — OCCULT BLOOD X 1 CARD TO LAB, STOOL
FECAL OCCULT BLD: POSITIVE — AB
FECAL OCCULT BLD: POSITIVE — AB

## 2018-05-30 LAB — MAGNESIUM: Magnesium: 2.1 mg/dL (ref 1.7–2.4)

## 2018-05-30 LAB — SEDIMENTATION RATE: Sed Rate: 43 mm/hr — ABNORMAL HIGH (ref 0–22)

## 2018-05-30 LAB — TROPONIN I: Troponin I: 0.31 ng/mL (ref <0.03)

## 2018-05-30 LAB — LIPASE, BLOOD: Lipase: 270 U/L — ABNORMAL HIGH (ref 11–51)

## 2018-05-30 MED ORDER — LACTATED RINGERS IV SOLN
INTRAVENOUS | Status: DC
  Administered 2018-05-30 – 2018-06-03 (×6): via INTRAVENOUS

## 2018-05-30 MED ORDER — BOOST / RESOURCE BREEZE PO LIQD CUSTOM
1.0000 | Freq: Three times a day (TID) | ORAL | Status: DC
  Administered 2018-05-30: 1 via ORAL
  Filled 2018-05-30 (×3): qty 1

## 2018-05-30 NOTE — Progress Notes (Signed)
  Echocardiogram 2D Echocardiogram has been performed.  Matilde Bash 05/30/2018, 11:43 AM

## 2018-05-30 NOTE — Progress Notes (Addendum)
Progress Note  Patient Name: [name] Date of Encounter: 05/30/2018  Primary Cardiologist: Sherren Mocha, MD   Subjective   Fatigued. No specific complains.   Inpatient Medications    Scheduled Meds: . allopurinol  100 mg Oral Daily  . ALPRAZolam  0.5 mg Oral QHS  . amLODipine  10 mg Oral Daily  . atorvastatin  40 mg Oral QPM  . carvedilol  25 mg Oral BID WC  . cholecalciferol  5,000 Units Oral Daily  . cloNIDine  0.2 mg Oral TID  . clopidogrel  75 mg Oral Q breakfast  . docusate sodium  100 mg Oral Daily  . DULoxetine  60 mg Oral Daily  . famotidine  20 mg Oral QAC supper  . fenofibrate  160 mg Oral QHS  . gabapentin  300 mg Oral BID  . heparin  5,000 Units Subcutaneous Q8H  . hydrALAZINE  100 mg Oral TID  . isosorbide mononitrate  15 mg Oral Daily  . mirtazapine  7.5 mg Oral QHS  . mometasone-formoterol  2 puff Inhalation BID  . nicotine  21 mg Transdermal Daily  . pantoprazole sodium  40 mg Per Tube Daily   Continuous Infusions: . sodium chloride 1,000 mL (05/29/18 2223)  . feeding supplement (VITAL AF 1.2 CAL) 50 mL/hr at 05/30/18 0315  . lactated ringers    . piperacillin-tazobactam (ZOSYN)  IV 3.375 g (05/30/18 0629)   PRN Meds: sodium chloride, acetaminophen **OR** acetaminophen, hydrALAZINE, HYDROcodone-acetaminophen, HYDROmorphone (DILAUDID) injection, hydrOXYzine, levalbuterol, ondansetron (ZOFRAN) IV, polyethylene glycol, zolpidem   Vital Signs    Vitals:   05/29/18 2048 05/29/18 2153 05/30/18 0452 05/30/18 0746  BP: (!) 133/46  (!) 151/48 (!) 151/44  Pulse: 66  78 68  Resp: 18  18 16   Temp: 98.3 F (36.8 C)  98.2 F (36.8 C) 98.1 F (36.7 C)  TempSrc: Oral  Oral Oral  SpO2: 96% 98% 97% 97%  Weight: 56.6 kg       Intake/Output Summary (Last 24 hours) at 05/30/2018 1027 Last data filed at 05/30/2018 0452 Gross per 24 hour  Intake 831.99 ml  Output 102 ml  Net 729.99 ml   Filed Weights   05/29/18 2048  Weight: 56.6 kg     Telemetry    Sr - Personally Reviewed  ECG    N/A  Physical Exam   GEN: Thin frail ill appearing female in no acute distress.   Neck: No JVD Cardiac: RRR, no murmurs, rubs, or gallops.  Respiratory: Clear to auscultation bilaterally. GI: Soft, nontender, non-distended  MS: No edema; No deformity. Neuro:  Nonfocal  Psych: Normal affect   Labs    Chemistry Recent Labs  Lab 05/28/18 1507 05/29/18 0335 05/29/18 1528 05/30/18 0236  NA 141 142 141 140  K 3.1* 3.0* 3.7 4.1  CL 97* 101 100 100  CO2 27 28 25 24   GLUCOSE 178* 102* 85 149*  BUN 27* 31* 32* 35*  CREATININE 1.80* 1.91* 1.98* 2.27*  CALCIUM 8.1* 7.4* 7.8* 7.7*  PROT 5.2*  --  4.5* 4.9*  ALBUMIN 2.4*  --  1.9* 1.9*  AST 18  --  20 17  ALT 8  --  7 8  ALKPHOS 48  --  36* 36*  BILITOT 1.7*  --  1.4* 1.8*  GFRNONAA 28* 26* 25* 21*  GFRAA 33* 31* 29* 25*  ANIONGAP 17* 13 16* 16*     Hematology Recent Labs  Lab 05/29/18 0335 05/29/18 1528 05/30/18 0236  WBC 14.5* 14.1* 14.4*  RBC 2.71* 2.66* 2.71*  HGB 8.4* 8.2* 8.4*  HCT 26.6* 26.7* 27.1*  MCV 98.2 100.4* 100.0  MCH 31.0 30.8 31.0  MCHC 31.6 30.7 31.0  RDW 16.7* 16.8* 16.5*  PLT 215 218 220    Cardiac Enzymes Recent Labs  Lab 05/29/18 0954 05/29/18 1528 05/29/18 2013 05/30/18 0236  TROPONINI 0.52* 0.44* 0.34* 0.31*    Recent Labs  Lab 05/28/18 1807  TROPIPOC 0.13*     BNP Recent Labs  Lab 05/28/18 2325  BNP 2,077.1*     DDimer No results for input(s): DDIMER in the last 168 hours.   Radiology    Ct Abdomen Pelvis Wo Contrast  Result Date: 05/28/2018 CLINICAL DATA:  New tiny right lung nodule on chest radiograph. Severe right lower quadrant abdominal pain with nausea, vomiting and diarrhea. Chronic kidney disease. Prior cholecystectomy 04/19/2018, hysterectomy and appendectomy. Prior bowel resection. EXAM: CT CHEST, ABDOMEN AND PELVIS WITHOUT CONTRAST TECHNIQUE: Multidetector CT imaging of the chest, abdomen and pelvis was  performed following the standard protocol without IV contrast. COMPARISON:  Chest radiograph from earlier today. FINDINGS: CT CHEST FINDINGS Cardiovascular: Borderline mild cardiomegaly. No significant pericardial effusion/thickening. 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 Atherosclerotic nonaneurysmal thoracic aorta. Proximal left subclavian artery stent is noted. Dilated main pulmonary artery (4.0 cm diameter). Mediastinum/Nodes: No discrete thyroid nodules. Unremarkable esophagus. No axillary adenopathy. Mild right paratracheal adenopathy up to 1.1 cm (series 3/image 21). Mildly enlarged 1.1 cm subcarinal node (series 3/image 26). No discrete hilar adenopathy on this noncontrast scan. Lungs/Pleura: No pneumothorax. No pleural effusion. A few tiny calcified granulomas are present in the upper lobes bilaterally. No acute consolidative airspace disease, lung masses or significant pulmonary nodules. Diffuse interlobular septal thickening. Musculoskeletal: No aggressive appearing focal osseous lesions. Intact sternotomy wires. Moderate thoracic spondylosis. CT ABDOMEN PELVIS FINDINGS Hepatobiliary: Normal liver with no liver mass. Cholecystectomy. Bile ducts are within normal post cholecystectomy limits with CBD diameter 7 mm. Pancreas: Thickening of the pancreatic head and neck. Prominent peripancreatic fat stranding and ill-defined fluid in the pancreatic head and neck, worsened. Poorly defined 5.1 x 2.5 cm anterior pancreatic head complex fluid collection with heterogeneous internal density (series 3/image 66), increased from 2.3 x 1.2 cm. No definite pancreatic duct dilation. Severe wall thickening throughout the adjacent descending duodenum, worsened. Fluid collection inferior to the gallbladder fossa measures 5.0 x 3.0 cm (series 3/image 71) and demonstrates layering hyperdense material, new. Spleen: Normal size. No mass. Adrenals/Urinary Tract: Normal adrenals. No hydronephrosis. No  renal stones. Partially exophytic simple 1.3 cm posterior interpolar left renal cyst. No additional contour deforming renal lesions. Normal bladder. Stomach/Bowel: Normal non-distended stomach. Normal caliber small bowel with no small bowel wall thickening. Appendectomy. New segmental wall thickening in the hepatic flexure of the colon. No additional sites of large bowel wall thickening. Stable postsurgical changes from distal large bowel resection. Vascular/Lymphatic: Atherosclerotic abdominal aorta with stable 5.0 cm infrarenal abdominal aortic aneurysm status post aorto bi-iliac stent graft repair. Surgical clips are noted in the right inguinal region. No pathologically enlarged lymph nodes in the abdomen or pelvis. Reproductive: Status post hysterectomy, with no abnormal findings at the vaginal cuff. No adnexal mass. Other: No pneumoperitoneum.  New small volume perihepatic ascites. Musculoskeletal: No aggressive appearing focal osseous lesions. Mild lumbar spondylosis. IMPRESSION: CT CHEST: 1. No lung masses or significant pulmonary nodules. 2. Borderline mild cardiomegaly. Diffuse interlobular septal thickening suggests mild pulmonary edema. No pleural effusions. 3. Dilated main pulmonary  artery, suggesting pulmonary arterial hypertension. CT ABDOMEN PELVIS: 1. Worsening severe inflammatory changes surrounding the pancreatic head and neck, descending duodenum and hepatic flexure of the colon. Enlarging nonspecific complex poorly defined fluid collection in the anterior pancreatic head. New complex fluid collection inferior to the gallbladder fossa with layering hyperdense material. Burtis Junes worsening pancreatitis with enlarging pancreatic pseudocysts. Marked wall thickening in the descending duodenum has worsened, and could be reactive, with peptic ulcer disease difficult to exclude. No free air. Short-term follow-up MRI or CT abdomen with IV contrast suggested. 2. New wall thickening throughout the hepatic  flexure of the colon, probably reactive. 3. Cholecystectomy. No intrahepatic biliary ductal dilatation. CBD diameter 7 mm, within normal post cholecystectomy limits. 4. New small volume perihepatic ascites. 5.  Aortic Atherosclerosis (ICD10-I70.0). Electronically Signed   By: Ilona Sorrel M.D.   On: 05/28/2018 20:41   Ct Chest Wo Contrast  Result Date: 05/28/2018 CLINICAL DATA:  New tiny right lung nodule on chest radiograph. Severe right lower quadrant abdominal pain with nausea, vomiting and diarrhea. Chronic kidney disease. Prior cholecystectomy 04/19/2018, hysterectomy and appendectomy. Prior bowel resection. EXAM: CT CHEST, ABDOMEN AND PELVIS WITHOUT CONTRAST TECHNIQUE: Multidetector CT imaging of the chest, abdomen and pelvis was performed following the standard protocol without IV contrast. COMPARISON:  Chest radiograph from earlier today. FINDINGS: CT CHEST FINDINGS Cardiovascular: Borderline mild cardiomegaly. No significant pericardial effusion/thickening. 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 Atherosclerotic nonaneurysmal thoracic aorta. Proximal left subclavian artery stent is noted. Dilated main pulmonary artery (4.0 cm diameter). Mediastinum/Nodes: No discrete thyroid nodules. Unremarkable esophagus. No axillary adenopathy. Mild right paratracheal adenopathy up to 1.1 cm (series 3/image 21). Mildly enlarged 1.1 cm subcarinal node (series 3/image 26). No discrete hilar adenopathy on this noncontrast scan. Lungs/Pleura: No pneumothorax. No pleural effusion. A few tiny calcified granulomas are present in the upper lobes bilaterally. No acute consolidative airspace disease, lung masses or significant pulmonary nodules. Diffuse interlobular septal thickening. Musculoskeletal: No aggressive appearing focal osseous lesions. Intact sternotomy wires. Moderate thoracic spondylosis. CT ABDOMEN PELVIS FINDINGS Hepatobiliary: Normal liver with no liver mass. Cholecystectomy. Bile  ducts are within normal post cholecystectomy limits with CBD diameter 7 mm. Pancreas: Thickening of the pancreatic head and neck. Prominent peripancreatic fat stranding and ill-defined fluid in the pancreatic head and neck, worsened. Poorly defined 5.1 x 2.5 cm anterior pancreatic head complex fluid collection with heterogeneous internal density (series 3/image 66), increased from 2.3 x 1.2 cm. No definite pancreatic duct dilation. Severe wall thickening throughout the adjacent descending duodenum, worsened. Fluid collection inferior to the gallbladder fossa measures 5.0 x 3.0 cm (series 3/image 71) and demonstrates layering hyperdense material, new. Spleen: Normal size. No mass. Adrenals/Urinary Tract: Normal adrenals. No hydronephrosis. No renal stones. Partially exophytic simple 1.3 cm posterior interpolar left renal cyst. No additional contour deforming renal lesions. Normal bladder. Stomach/Bowel: Normal non-distended stomach. Normal caliber small bowel with no small bowel wall thickening. Appendectomy. New segmental wall thickening in the hepatic flexure of the colon. No additional sites of large bowel wall thickening. Stable postsurgical changes from distal large bowel resection. Vascular/Lymphatic: Atherosclerotic abdominal aorta with stable 5.0 cm infrarenal abdominal aortic aneurysm status post aorto bi-iliac stent graft repair. Surgical clips are noted in the right inguinal region. No pathologically enlarged lymph nodes in the abdomen or pelvis. Reproductive: Status post hysterectomy, with no abnormal findings at the vaginal cuff. No adnexal mass. Other: No pneumoperitoneum.  New small volume perihepatic ascites. Musculoskeletal: No aggressive appearing focal osseous lesions.  Mild lumbar spondylosis. IMPRESSION: CT CHEST: 1. No lung masses or significant pulmonary nodules. 2. Borderline mild cardiomegaly. Diffuse interlobular septal thickening suggests mild pulmonary edema. No pleural effusions. 3.  Dilated main pulmonary artery, suggesting pulmonary arterial hypertension. CT ABDOMEN PELVIS: 1. Worsening severe inflammatory changes surrounding the pancreatic head and neck, descending duodenum and hepatic flexure of the colon. Enlarging nonspecific complex poorly defined fluid collection in the anterior pancreatic head. New complex fluid collection inferior to the gallbladder fossa with layering hyperdense material. Burtis Junes worsening pancreatitis with enlarging pancreatic pseudocysts. Marked wall thickening in the descending duodenum has worsened, and could be reactive, with peptic ulcer disease difficult to exclude. No free air. Short-term follow-up MRI or CT abdomen with IV contrast suggested. 2. New wall thickening throughout the hepatic flexure of the colon, probably reactive. 3. Cholecystectomy. No intrahepatic biliary ductal dilatation. CBD diameter 7 mm, within normal post cholecystectomy limits. 4. New small volume perihepatic ascites. 5.  Aortic Atherosclerosis (ICD10-I70.0). Electronically Signed   By: Ilona Sorrel M.D.   On: 05/28/2018 20:41   Dg Chest Portable 1 View  Result Date: 05/28/2018 CLINICAL DATA:  Severe right lower quadrant pain. EXAM: PORTABLE CHEST 1 VIEW COMPARISON:  04/18/2018 FINDINGS: The lungs are clear without focal pneumonia, edema, pneumothorax or pleural effusion. Interstitial markings are diffusely coarsened with chronic features. Tiny nodular density identified right upper lung, superimposed on anterior right second rib. Patient is status post median sternotomy. Left atrial appendage occluded device noted. Vascular stent superimposed on the transverse aorta. The visualized bony structures of the thorax are intact. IMPRESSION: Cardiomegaly with mild vascular congestion or chronic interstitial coarsening. Tiny nodule right upper lung not visible on prior studies and indeterminate. CT chest without contrast could be used to further evaluate. Electronically Signed   By: Misty Stanley M.D.   On: 05/28/2018 17:23   Dg Abd Portable 1v  Result Date: 05/29/2018 CLINICAL DATA:  Feeding tube placement EXAM: PORTABLE ABDOMEN - 1 VIEW COMPARISON:  None. FINDINGS: Gaseous distension of the stomach and proximal small bowel. Feeding tube with the tip projecting over antrum versus proximal duodenum. No evidence of pneumoperitoneum, portal venous gas or pneumatosis. No pathologic calcifications along the expected course of the ureters. Aorto bi-iliac stent graft is noted. Left renal artery stent is noted. Osseous structures are unremarkable. IMPRESSION: Gaseous distension of the stomach and proximal small bowel. Feeding tube with the tip projecting over antrum versus proximal duodenum. Electronically Signed   By: Kathreen Devoid   On: 05/29/2018 15:30    Cardiac Studies   None  Patient Profile     [name] is a 67 y.o. female with a hx of hypertension, hyperlipidemia, diet-controlled diabetes, COPD on home oxygen, GERD, gout, depression, ongoing tobacco abuse, paroxysmal atrial fibrillation not on anticoagulation, AAA s/p endovascular repair 01/2018 per Dr. Trula Slade, CAD s/p CABG with MVR (2012) with PCI to SVG to rPDA (2016), CHF, CKD stage IV s/p fistula placement, GI bleed, PAD (bilateral renal artery stents) and recent  laparoscopic cholecystectomy admitted for recurrent pancreatitis and GI bleed who seen of chest pain and elevated troponin.   Assessment & Plan    1. Chest pain and elevated troponin - Peak of troponin 0.52, now trending down. Presentation more consistent with demand ischemia as recommended by Dr. Ellyn Hack yesterday. May consider outpatient stress test. No new recommendations.   2. GI Bleed - Patient on Plavix with hx for CAD and PVD. Will review with Dr. Burt Knack    [facility]  will sign off.   Medication Recommendations/Other recommendations (labs, testing, etc):  Per Dr. Burt Knack  Follow up as an outpatient:  4 weeks post hospital follow up w  For  questions or updates, please contact [place] Please consult [email or web address] for contact info under       SignedLeanor Kail, PA  05/30/2018, 10:27 AM    Patient seen, examined. Available data reviewed. Agree with findings, assessment, and plan as outlined by Robbie Lis, PA-C.  The patient is independently interviewed and examined.  She is a chronically ill-appearing, frail, in no distress.  JVP is normal, lung fields are coarse bilaterally, heart is regular rate and rhythm with a grade 2/6 systolic murmur at the left sternal border, abdomen is soft, extremities show no edema.  The patient's clinical presentation with elevated troponin and a flat low level trend in the setting of acute medical illness is consistent with demand ischemia.  She is not a candidate for invasive cardiac catheterization, nor would it be appropriate at this point.  Recommend supportive medical therapy.  Will arrange outpatient follow-up but I would not anticipate any further inpatient cardiac testing.  Today's echo study is reviewed and demonstrates normal LV systolic function, moderate mitral stenosis, and mild mitral regurgitation.  Sherren Mocha, M.D. 05/30/2018 6:09 PM

## 2018-05-30 NOTE — Progress Notes (Addendum)
Daily Rounding Note  05/30/2018, 9:29 AM  LOS: 2 days   SUBJECTIVE:   Chief complaint:  Abdominal pain. Continues severe, most prominent in RUQ.  No nausea  Gastric feeding, Cortrak tube in place. RD unable to get tip into post pyloric position.  Rate at 12.5/hour.  No nausea.     Total I/O:  +430 ml.  Stools loose, as they are at home intermittently.    OBJECTIVE:         Vital signs in last 24 hours:    Temp:  [98.1 F (36.7 C)-98.5 F (36.9 C)] 98.1 F (36.7 C) (09/24 0746) Pulse Rate:  [66-88] 68 (09/24 0746) Resp:  [16-18] 16 (09/24 0746) BP: (133-151)/(44-65) 151/44 (09/24 0746) SpO2:  [96 %-98 %] 97 % (09/24 0746) Weight:  [56.6 kg] 56.6 kg (09/23 2048) Last BM Date: 05/29/18 Filed Weights   05/29/18 2048  Weight: 56.6 kg   General: looks uncomfortable and ill.  alert   Heart: slightly irregular Chest: dry crackles in base, > on right.  No dyspnea or cough Abdomen: soft, diffusely tender.  ND.  Rare tinkling BS  Extremities: no CCE.  Neuro/Psych:  Oriented x 3.  Mentation, speech slightly slow.  Moves all 4 limbs, no tremor.    Intake/Output from previous day: 09/23 0701 - 09/24 0700 In: 832 [P.O.:120; I.V.:235.7; NG/GT:365.3; IV Piggyback:111] Out: 402 [Urine:400; Stool:2]  Intake/Output this shift: No intake/output data recorded.  Lab Results: Recent Labs    05/29/18 0335 05/29/18 1528 05/30/18 0236  WBC 14.5* 14.1* 14.4*  HGB 8.4* 8.2* 8.4*  HCT 26.6* 26.7* 27.1*  PLT 215 218 220   BMET Recent Labs    05/29/18 0335 05/29/18 1528 05/30/18 0236  NA 142 141 140  K 3.0* 3.7 4.1  CL 101 100 100  CO2 '28 25 24  '$ GLUCOSE 102* 85 149*  BUN 31* 32* 35*  CREATININE 1.91* 1.98* 2.27*  CALCIUM 7.4* 7.8* 7.7*   LFT Recent Labs    05/28/18 1507 05/29/18 1528 05/30/18 0236  PROT 5.2* 4.5* 4.9*  ALBUMIN 2.4* 1.9* 1.9*  AST '18 20 17  '$ ALT '8 7 8  '$ ALKPHOS 48 36* 36*  BILITOT 1.7* 1.4*  1.8*   PT/INR Recent Labs    05/28/18 2325  LABPROT 14.4  INR 1.13   Hepatitis Panel No results for input(s): HEPBSAG, HCVAB, HEPAIGM, HEPBIGM in the last 72 hours.  Studies/Results: Ct Abdomen Pelvis Wo Contrast  Result Date: 05/28/2018 CLINICAL DATA:  New tiny right lung nodule on chest radiograph. Severe right lower quadrant abdominal pain with nausea, vomiting and diarrhea. Chronic kidney disease. Prior cholecystectomy 04/19/2018, hysterectomy and appendectomy. Prior bowel resection. EXAM: CT CHEST, ABDOMEN AND PELVIS WITHOUT CONTRAST TECHNIQUE: Multidetector CT imaging of the chest, abdomen and pelvis was performed following the standard protocol without IV contrast. COMPARISON:  Chest radiograph from earlier today. FINDINGS: CT CHEST FINDINGS Cardiovascular: Borderline mild cardiomegaly. No significant pericardial effusion/thickening. 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 Atherosclerotic nonaneurysmal thoracic aorta. Proximal left subclavian artery stent is noted. Dilated main pulmonary artery (4.0 cm diameter). Mediastinum/Nodes: No discrete thyroid nodules. Unremarkable esophagus. No axillary adenopathy. Mild right paratracheal adenopathy up to 1.1 cm (series 3/image 21). Mildly enlarged 1.1 cm subcarinal node (series 3/image 26). No discrete hilar adenopathy on this noncontrast scan. Lungs/Pleura: No pneumothorax. No pleural effusion. A few tiny calcified granulomas are present in the upper lobes bilaterally. No acute consolidative airspace disease,  lung masses or significant pulmonary nodules. Diffuse interlobular septal thickening. Musculoskeletal: No aggressive appearing focal osseous lesions. Intact sternotomy wires. Moderate thoracic spondylosis. CT ABDOMEN PELVIS FINDINGS Hepatobiliary: Normal liver with no liver mass. Cholecystectomy. Bile ducts are within normal post cholecystectomy limits with CBD diameter 7 mm. Pancreas: Thickening of the pancreatic head  and neck. Prominent peripancreatic fat stranding and ill-defined fluid in the pancreatic head and neck, worsened. Poorly defined 5.1 x 2.5 cm anterior pancreatic head complex fluid collection with heterogeneous internal density (series 3/image 66), increased from 2.3 x 1.2 cm. No definite pancreatic duct dilation. Severe wall thickening throughout the adjacent descending duodenum, worsened. Fluid collection inferior to the gallbladder fossa measures 5.0 x 3.0 cm (series 3/image 71) and demonstrates layering hyperdense material, new. Spleen: Normal size. No mass. Adrenals/Urinary Tract: Normal adrenals. No hydronephrosis. No renal stones. Partially exophytic simple 1.3 cm posterior interpolar left renal cyst. No additional contour deforming renal lesions. Normal bladder. Stomach/Bowel: Normal non-distended stomach. Normal caliber small bowel with no small bowel wall thickening. Appendectomy. New segmental wall thickening in the hepatic flexure of the colon. No additional sites of large bowel wall thickening. Stable postsurgical changes from distal large bowel resection. Vascular/Lymphatic: Atherosclerotic abdominal aorta with stable 5.0 cm infrarenal abdominal aortic aneurysm status post aorto bi-iliac stent graft repair. Surgical clips are noted in the right inguinal region. No pathologically enlarged lymph nodes in the abdomen or pelvis. Reproductive: Status post hysterectomy, with no abnormal findings at the vaginal cuff. No adnexal mass. Other: No pneumoperitoneum.  New small volume perihepatic ascites. Musculoskeletal: No aggressive appearing focal osseous lesions. Mild lumbar spondylosis. IMPRESSION: CT CHEST: 1. No lung masses or significant pulmonary nodules. 2. Borderline mild cardiomegaly. Diffuse interlobular septal thickening suggests mild pulmonary edema. No pleural effusions. 3. Dilated main pulmonary artery, suggesting pulmonary arterial hypertension. CT ABDOMEN PELVIS: 1. Worsening severe inflammatory  changes surrounding the pancreatic head and neck, descending duodenum and hepatic flexure of the colon. Enlarging nonspecific complex poorly defined fluid collection in the anterior pancreatic head. New complex fluid collection inferior to the gallbladder fossa with layering hyperdense material. Burtis Junes worsening pancreatitis with enlarging pancreatic pseudocysts. Marked wall thickening in the descending duodenum has worsened, and could be reactive, with peptic ulcer disease difficult to exclude. No free air. Short-term follow-up MRI or CT abdomen with IV contrast suggested. 2. New wall thickening throughout the hepatic flexure of the colon, probably reactive. 3. Cholecystectomy. No intrahepatic biliary ductal dilatation. CBD diameter 7 mm, within normal post cholecystectomy limits. 4. New small volume perihepatic ascites. 5.  Aortic Atherosclerosis (ICD10-I70.0). Electronically Signed   By: Ilona Sorrel M.D.   On: 05/28/2018 20:41   Ct Chest Wo Contrast  Result Date: 05/28/2018 CLINICAL DATA:  New tiny right lung nodule on chest radiograph. Severe right lower quadrant abdominal pain with nausea, vomiting and diarrhea. Chronic kidney disease. Prior cholecystectomy 04/19/2018, hysterectomy and appendectomy. Prior bowel resection. EXAM: CT CHEST, ABDOMEN AND PELVIS WITHOUT CONTRAST TECHNIQUE: Multidetector CT imaging of the chest, abdomen and pelvis was performed following the standard protocol without IV contrast. COMPARISON:  Chest radiograph from earlier today. FINDINGS: CT CHEST FINDINGS Cardiovascular: Borderline mild cardiomegaly. No significant pericardial effusion/thickening. 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 Atherosclerotic nonaneurysmal thoracic aorta. Proximal left subclavian artery stent is noted. Dilated main pulmonary artery (4.0 cm diameter). Mediastinum/Nodes: No discrete thyroid nodules. Unremarkable esophagus. No axillary adenopathy. Mild right paratracheal  adenopathy up to 1.1 cm (series 3/image 21). Mildly enlarged 1.1 cm subcarinal node (  series 3/image 26). No discrete hilar adenopathy on this noncontrast scan. Lungs/Pleura: No pneumothorax. No pleural effusion. A few tiny calcified granulomas are present in the upper lobes bilaterally. No acute consolidative airspace disease, lung masses or significant pulmonary nodules. Diffuse interlobular septal thickening. Musculoskeletal: No aggressive appearing focal osseous lesions. Intact sternotomy wires. Moderate thoracic spondylosis. CT ABDOMEN PELVIS FINDINGS Hepatobiliary: Normal liver with no liver mass. Cholecystectomy. Bile ducts are within normal post cholecystectomy limits with CBD diameter 7 mm. Pancreas: Thickening of the pancreatic head and neck. Prominent peripancreatic fat stranding and ill-defined fluid in the pancreatic head and neck, worsened. Poorly defined 5.1 x 2.5 cm anterior pancreatic head complex fluid collection with heterogeneous internal density (series 3/image 66), increased from 2.3 x 1.2 cm. No definite pancreatic duct dilation. Severe wall thickening throughout the adjacent descending duodenum, worsened. Fluid collection inferior to the gallbladder fossa measures 5.0 x 3.0 cm (series 3/image 71) and demonstrates layering hyperdense material, new. Spleen: Normal size. No mass. Adrenals/Urinary Tract: Normal adrenals. No hydronephrosis. No renal stones. Partially exophytic simple 1.3 cm posterior interpolar left renal cyst. No additional contour deforming renal lesions. Normal bladder. Stomach/Bowel: Normal non-distended stomach. Normal caliber small bowel with no small bowel wall thickening. Appendectomy. New segmental wall thickening in the hepatic flexure of the colon. No additional sites of large bowel wall thickening. Stable postsurgical changes from distal large bowel resection. Vascular/Lymphatic: Atherosclerotic abdominal aorta with stable 5.0 cm infrarenal abdominal aortic aneurysm  status post aorto bi-iliac stent graft repair. Surgical clips are noted in the right inguinal region. No pathologically enlarged lymph nodes in the abdomen or pelvis. Reproductive: Status post hysterectomy, with no abnormal findings at the vaginal cuff. No adnexal mass. Other: No pneumoperitoneum.  New small volume perihepatic ascites. Musculoskeletal: No aggressive appearing focal osseous lesions. Mild lumbar spondylosis. IMPRESSION: CT CHEST: 1. No lung masses or significant pulmonary nodules. 2. Borderline mild cardiomegaly. Diffuse interlobular septal thickening suggests mild pulmonary edema. No pleural effusions. 3. Dilated main pulmonary artery, suggesting pulmonary arterial hypertension. CT ABDOMEN PELVIS: 1. Worsening severe inflammatory changes surrounding the pancreatic head and neck, descending duodenum and hepatic flexure of the colon. Enlarging nonspecific complex poorly defined fluid collection in the anterior pancreatic head. New complex fluid collection inferior to the gallbladder fossa with layering hyperdense material. Burtis Junes worsening pancreatitis with enlarging pancreatic pseudocysts. Marked wall thickening in the descending duodenum has worsened, and could be reactive, with peptic ulcer disease difficult to exclude. No free air. Short-term follow-up MRI or CT abdomen with IV contrast suggested. 2. New wall thickening throughout the hepatic flexure of the colon, probably reactive. 3. Cholecystectomy. No intrahepatic biliary ductal dilatation. CBD diameter 7 mm, within normal post cholecystectomy limits. 4. New small volume perihepatic ascites. 5.  Aortic Atherosclerosis (ICD10-I70.0). Electronically Signed   By: Ilona Sorrel M.D.   On: 05/28/2018 20:41   Dg Chest Portable 1 View  Result Date: 05/28/2018 CLINICAL DATA:  Severe right lower quadrant pain. EXAM: PORTABLE CHEST 1 VIEW COMPARISON:  04/18/2018 FINDINGS: The lungs are clear without focal pneumonia, edema, pneumothorax or pleural  effusion. Interstitial markings are diffusely coarsened with chronic features. Tiny nodular density identified right upper lung, superimposed on anterior right second rib. Patient is status post median sternotomy. Left atrial appendage occluded device noted. Vascular stent superimposed on the transverse aorta. The visualized bony structures of the thorax are intact. IMPRESSION: Cardiomegaly with mild vascular congestion or chronic interstitial coarsening. Tiny nodule right upper lung not visible on prior studies and indeterminate. CT  chest without contrast could be used to further evaluate. Electronically Signed   By: Misty Stanley M.D.   On: 05/28/2018 17:23   Dg Abd Portable 1v  Result Date: 05/29/2018 CLINICAL DATA:  Feeding tube placement EXAM: PORTABLE ABDOMEN - 1 VIEW COMPARISON:  None. FINDINGS: Gaseous distension of the stomach and proximal small bowel. Feeding tube with the tip projecting over antrum versus proximal duodenum. No evidence of pneumoperitoneum, portal venous gas or pneumatosis. No pathologic calcifications along the expected course of the ureters. Aorto bi-iliac stent graft is noted. Left renal artery stent is noted. Osseous structures are unremarkable. IMPRESSION: Gaseous distension of the stomach and proximal small bowel. Feeding tube with the tip projecting over antrum versus proximal duodenum. Electronically Signed   By: Kathreen Devoid   On: 05/29/2018 15:30   Scheduled Meds: . allopurinol  100 mg Oral Daily  . ALPRAZolam  0.5 mg Oral QHS  . amLODipine  10 mg Oral Daily  . atorvastatin  40 mg Oral QPM  . carvedilol  25 mg Oral BID WC  . cholecalciferol  5,000 Units Oral Daily  . cloNIDine  0.2 mg Oral TID  . clopidogrel  75 mg Oral Q breakfast  . docusate sodium  100 mg Oral Daily  . DULoxetine  60 mg Oral Daily  . famotidine  20 mg Oral QAC supper  . fenofibrate  160 mg Oral QHS  . ferrous sulfate  325 mg Oral Q breakfast  . gabapentin  300 mg Oral BID  . heparin  5,000  Units Subcutaneous Q8H  . hydrALAZINE  100 mg Oral TID  . isosorbide mononitrate  15 mg Oral Daily  . mirtazapine  7.5 mg Oral QHS  . mometasone-formoterol  2 puff Inhalation BID  . nicotine  21 mg Transdermal Daily  . pantoprazole sodium  40 mg Per Tube Daily   Continuous Infusions: . sodium chloride 1,000 mL (05/29/18 2223)  . feeding supplement (VITAL AF 1.2 CAL) 50 mL/hr at 05/30/18 0315  . piperacillin-tazobactam (ZOSYN)  IV 3.375 g (05/30/18 0629)   PRN Meds:.sodium chloride, acetaminophen **OR** acetaminophen, hydrALAZINE, HYDROcodone-acetaminophen, HYDROmorphone (DILAUDID) injection, hydrOXYzine, levalbuterol, ondansetron (ZOFRAN) IV, polyethylene glycol, zolpidem   ASSESMENT:   *   Recurrent vs never resolved acute pancreatitis. S/p 04/19/18 lap chole for presumed biliary pancreatitis. Minor elevation T bili, o/w normal LFTS.  No fevers, improved.. Stable WBCs.    Elevated ESR @ 43 (rr 0-22), normal CRP.    *   CHF.  Elevated BNP.  Mild elevationTroponin Is attributed to demand ischemia per DR Ellyn Hack on 9/23. Not SOB.   *   GIB, acute on chronic anemia 04/2018.    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    *    Chronic anemia.  Per iron studies of 04/10/2018, not iron deficient.  Continues on chronic po iron.  Borderline macrocytosis, no recent B12, Folate levels Stable Hgb within historic range.    *   Hyperglycemia.   *   Hypocalcemia and hypoalbuminemia.       *   ASPVD, CAD.  Chronic Plavix, not on hold.    *    S/p 2013 colon resection for complicated diverticular dz.     *   Worsening renal failure, baseline stage 5 CKD.  S/p fistula creation for eventual hemodialysis.   400 ml urine recorded 9/23.    *   Small RUL lung nodule.  Paratracheal and subcarinal adenopathy.  Per CT.  PLAN   *  Continue supportive care.  May be able to increase TFs toward goal rate of 60 ml/hour.  Will d/w Dr Jerilynn Mages.    *   Pending labs include ANA,  IgG4.   CBC, BMET, Lipase, B12 and folate levels in AM.  *  Place purewick, non-invasive, urine collection device.  Need accurate urine measures.    *  Adding Lact Ringers at 50 ml/hour.    *  Stop oral iron in setting of potential infection.  *   Await ionized calcium, supplement Ca level if indicated.    *    Pt may benefit  from PCA pump.      [name]  05/30/2018, 9:29 AM Phone [phone]

## 2018-05-30 NOTE — Progress Notes (Signed)
PROGRESS NOTE  ADDIE [name] JJK:[id number] DOB: Aug 06, 1951 DOA: 05/28/2018 PCP: Bernerd Limbo, MD  Brief Summary:  Hx of A fib, not on anticoagulation due to gi bleed, CADs/p CABG, DES, dCHF, CKD-iv,s/p avf fistula placement, recent admission for gallstone pancreatitis, S/p of cholecystectomy in 04/2018, presenting with ab pain, n/v, recurrent pancreatitis, possible pseudocyst.  Has leukocytosis with WBC 23.7, troponin elevation  She was seen in ED on 9/9 for ab pain, n/v, she was advised to follow up with gi in the clinic  She symptom has progressed, she Vomited 8-10 times last week, she barely eat since wendesday Wight loss FTT  HPI/Recap of past 24 hours:  On tube feed, had diarrheax2 since started on tube feeds,  She continue to have Right sided abdominal pain, no n/v today,  No fever, denies chest pain She is on 2liter o2 which is her baseline  400cc urine output documented  Assessment/Plan: Principal Problem:   Pancreatitis, recurrent Active Problems:   TOBACCO ABUSE   Essential hypertension   Atrial fibrillation (HCC)   COPD (chronic obstructive pulmonary disease) (HCC)   S/P CABG x 3   Anemia   GERD (gastroesophageal reflux disease)   Hyperlipidemia   CAD (coronary artery disease)   Chronic kidney disease (CKD), stage IV (severe) (HCC)   Chronic diastolic congestive heart failure (HCC)   Type II diabetes mellitus with renal manifestations (HCC)   Elevated troponin   Hypertensive urgency   Hypokalemia   SIRS (systemic inflammatory response syndrome) (HCC)   Protein-calorie malnutrition, severe    Ab pain /N/V(right side ) -s/p cholecystectomy on 8/14 for gallstone pancreatitis -Worsening of  lipase,  -CT ab/pel without contrast, "Worsening severe inflammatory changes surrounding the pancreatic head and neck, descending duodenum and hepatic flexure of the colon. Enlarging nonspecific complex poorly defined fluid collection in the anterior pancreatic head.  New complex fluid collection inferior to the gallbladder fossa with layering hyperdense material. Burtis Junes worsening pancreatitis with enlarging pancreatic pseudocysts. Marked wall thickening in the descending duodenum has worsened, and could be reactive, with peptic ulcer disease difficult to exclude. No free air. Short-term follow-up MRI or CT abdomen with IV contrast suggested. 2. New wall thickening throughout the hepatic flexure of the colon, probably reactive. 3. Cholecystectomy. No intrahepatic biliary ductal dilatation. CBD diameter 7 mm, within normal post cholecystectomy limits. 4. New small volume perihepatic ascites. 5.  Aortic Atherosclerosis (ICD10-I70.0). - follow GI recommendation, she is started on post pyloric feeding on 9/23, continue ivf and empirical abx zosyn for now, prn analgesics    Acute on chronic anemia hgb dropped from 11 to 8.4 Will follow gi recommendation, transfuse to keep sbp > 8  Hypokalemia/hypomagnesemia:  replaced and normalized  Leukocytosis -From stress? Blood culture no growth, ua with rare bacteria, urine culture pending collection -chest imaging on focal infiltrate -she is on empirical abx zosyn to cover intraabdominal source  H/o CAD s/p CABG, DES placement in 2016, chronic RBBB, diastolic chf, PAF not on anticoagulation  --CT chest without contrast "1. No lung masses or significant pulmonary nodules. 2. Borderline mild cardiomegaly. Diffuse interlobular septal thickening suggests mild pulmonary edema. No pleural effusions. 3. Dilated main pulmonary artery, suggesting pulmonary arterial Hypertension." -Elevation of troponin, peaked at 0.5, trending down,  -She denies chest pain, does has abdominal pain, she appears dry, no edema,  -echocardiogram lvef wnl, no wma. -Cardiology consulted   CKDIV Not on HD S/p avf placement 10/2017, avf stenosis followed by vascular outpatient Cr appear at baseline Monitor  urine output, cr , renal  dosing meds  HTN Stable on current meds  h/o AAA repair in 12/1322, complicated by retroperitoneal bleed  PAD (bilateral renal artery stents)  Smoker, COPD, Report on home o2 at night -smoking cessation education provided, nicotine patch ordered  Severe malnutrition in context of chronic illness -nutrition consult appreciated  FTT:  family wants home health and referral to palliative care in the community She does not wants to go to snf. Will get PT /OT eval once medically more stable  Code Status: full code, verified with daughter and patient  Family Communication: patient and daughter at bedside on 9/23  Disposition Plan: not very to discharge Prognosis is guarded    Consultants:  GI  cardiology  Procedures:  cortrak feeding tube placement on 9/23  Antibiotics:  Zosyn from admission   Objective: BP (!) 135/47 (BP Location: Right Arm)   Pulse 68   Temp 97.8 F (36.6 C) (Oral)   Resp 18   Wt 56.6 kg   SpO2 99%   BMI 20.76 kg/m   Intake/Output Summary (Last 24 hours) at 05/30/2018 1412 Last data filed at 05/30/2018 1000 Gross per 24 hour  Intake 565.63 ml  Output 102 ml  Net 463.63 ml   Filed Weights   05/29/18 2048  Weight: 56.6 kg    Exam: Patient is examined daily including today on 05/30/2018, exams remain the same as of yesterday except that has changed    General:  Frail, in pain, chronic ill appearing, pale  Cardiovascular: RRR  Respiratory: CTABL  Abdomen: right sided abdomen tender, guarding,  positive BS  Musculoskeletal: No Edema  Neuro: lethargic, oriented x3  Data Reviewed: Basic Metabolic Panel: Recent Labs  Lab 05/28/18 1507 05/29/18 0335 05/29/18 1528 05/30/18 0236  NA 141 142 141 140  K 3.1* 3.0* 3.7 4.1  CL 97* 101 100 100  CO2 27 28 25 24   GLUCOSE 178* 102* 85 149*  BUN 27* 31* 32* 35*  CREATININE 1.80* 1.91* 1.98* 2.27*  CALCIUM 8.1* 7.4* 7.8* 7.7*  MG  --  1.3* 2.1 2.1   Liver Function Tests: Recent  Labs  Lab 05/28/18 1507 05/29/18 1528 05/30/18 0236  AST 18 20 17   ALT 8 7 8   ALKPHOS 48 36* 36*  BILITOT 1.7* 1.4* 1.8*  PROT 5.2* 4.5* 4.9*  ALBUMIN 2.4* 1.9* 1.9*   Recent Labs  Lab 05/28/18 1507 05/29/18 0335 05/30/18 0236  LIPASE 399* 667* 270*   No results for input(s): AMMONIA in the last 168 hours. CBC: Recent Labs  Lab 05/28/18 1507 05/29/18 0335 05/29/18 1528 05/30/18 0236  WBC 23.7* 14.5* 14.1* 14.4*  NEUTROABS  --   --  12.9*  --   HGB 11.0* 8.4* 8.2* 8.4*  HCT 34.2* 26.6* 26.7* 27.1*  MCV 97.7 98.2 100.4* 100.0  PLT 339 215 218 220   Cardiac Enzymes:   Recent Labs  Lab 05/29/18 0335 05/29/18 0954 05/29/18 1528 05/29/18 2013 05/30/18 0236  TROPONINI 0.35* 0.52* 0.44* 0.34* 0.31*   BNP (last 3 results) Recent Labs    04/17/18 0534 04/18/18 0501 05/28/18 2325  BNP 2,002.3* 1,099.7* 2,077.1*    ProBNP (last 3 results) No results for input(s): PROBNP in the last 8760 hours.  CBG: Recent Labs  Lab 05/29/18 2047 05/30/18 0007 05/30/18 0411 05/30/18 0748 05/30/18 1311  GLUCAP 93 119* 134* 137* 234*    Recent Results (from the past 240 hour(s))  Blood culture (routine x 2)     Status:  None (Preliminary result)   Collection Time: 05/28/18  5:54 PM  Result Value Ref Range Status   Specimen Description BLOOD RIGHT ANTECUBITAL  Final   Special Requests   Final    BOTTLES DRAWN AEROBIC AND ANAEROBIC Blood Culture results may not be optimal due to an excessive volume of blood received in culture bottles   Culture   Final    NO GROWTH 2 DAYS Performed at [facility] [address], [place], [place] [address]    Report Status PENDING  Incomplete  Blood culture (routine x 2)     Status: None (Preliminary result)   Collection Time: 05/28/18  6:35 PM  Result Value Ref Range Status   Specimen Description BLOOD BLOOD RIGHT FOREARM  Final   Special Requests   Final    BOTTLES DRAWN AEROBIC ONLY Blood Culture results may not be optimal  due to an inadequate volume of blood received in culture bottles   Culture   Final    NO GROWTH 2 DAYS Performed at [facility], [place] [address] [place], [place] [address]    Report Status PENDING  Incomplete     Studies: Dg Abd 2 Views  Result Date: 05/30/2018 CLINICAL DATA:  History of pancreatitis.  Abdominal pain. EXAM: ABDOMEN - 2 VIEW COMPARISON:  Chest radiograph 05/29/2018 FINDINGS: Feeding tube tip projects at the gastric antrum/proximal duodenum, unchanged. Aortic stent graft material. Monitoring leads overlie the patient. Gas is demonstrated within nondilated loops of large and small bowel. Small left pleural effusion. Heterogeneous opacities left lung base. IMPRESSION: Feeding tube projects at the gastric antrum/proximal duodenum, unchanged. Decreased gaseous distention of the stomach and small bowel. Electronically Signed   By: Lovey Newcomer M.D.   On: 05/30/2018 10:49   Dg Abd Portable 1v  Result Date: 05/29/2018 CLINICAL DATA:  Feeding tube placement EXAM: PORTABLE ABDOMEN - 1 VIEW COMPARISON:  None. FINDINGS: Gaseous distension of the stomach and proximal small bowel. Feeding tube with the tip projecting over antrum versus proximal duodenum. No evidence of pneumoperitoneum, portal venous gas or pneumatosis. No pathologic calcifications along the expected course of the ureters. Aorto bi-iliac stent graft is noted. Left renal artery stent is noted. Osseous structures are unremarkable. IMPRESSION: Gaseous distension of the stomach and proximal small bowel. Feeding tube with the tip projecting over antrum versus proximal duodenum. Electronically Signed   By: Kathreen Devoid   On: 05/29/2018 15:30    Scheduled Meds: . allopurinol  100 mg Oral Daily  . ALPRAZolam  0.5 mg Oral QHS  . amLODipine  10 mg Oral Daily  . atorvastatin  40 mg Oral QPM  . carvedilol  25 mg Oral BID WC  . cholecalciferol  5,000 Units Oral Daily  . cloNIDine  0.2 mg Oral TID  . clopidogrel  75 mg Oral Q  breakfast  . docusate sodium  100 mg Oral Daily  . DULoxetine  60 mg Oral Daily  . famotidine  20 mg Oral QAC supper  . fenofibrate  160 mg Oral QHS  . gabapentin  300 mg Oral BID  . heparin  5,000 Units Subcutaneous Q8H  . hydrALAZINE  100 mg Oral TID  . isosorbide mononitrate  15 mg Oral Daily  . mirtazapine  7.5 mg Oral QHS  . mometasone-formoterol  2 puff Inhalation BID  . nicotine  21 mg Transdermal Daily  . pantoprazole sodium  40 mg Per Tube Daily    Continuous Infusions: . sodium chloride 1,000 mL (  05/29/18 2223)  . feeding supplement (VITAL AF 1.2 CAL) 50 mL/hr at 05/30/18 0315  . lactated ringers 50 mL/hr at 05/30/18 1042  . piperacillin-tazobactam (ZOSYN)  IV 3.375 g (05/30/18 1325)     Time spent: 19mins, case discussed with GI I have personally reviewed and interpreted on  05/30/2018 daily labs, tele strips, imagings as discussed above under date review session and assessment and plans.  I reviewed all nursing notes, pharmacy notes, consultant notes,  vitals, pertinent old records  I have discussed plan of care as described above with RN , patient and family on 05/30/2018   Florencia Reasons MD, PhD  [facility] Pager [phone]. If 7PM-7AM, please contact night-coverage at [email or web address], password [facility] 05/30/2018, 2:12 PM  LOS: 2 days

## 2018-05-31 DIAGNOSIS — Z4659 Encounter for fitting and adjustment of other gastrointestinal appliance and device: Secondary | ICD-10-CM

## 2018-05-31 DIAGNOSIS — E639 Nutritional deficiency, unspecified: Secondary | ICD-10-CM

## 2018-05-31 LAB — GLUCOSE, CAPILLARY
GLUCOSE-CAPILLARY: 216 mg/dL — AB (ref 70–99)
GLUCOSE-CAPILLARY: 227 mg/dL — AB (ref 70–99)
Glucose-Capillary: 166 mg/dL — ABNORMAL HIGH (ref 70–99)
Glucose-Capillary: 171 mg/dL — ABNORMAL HIGH (ref 70–99)
Glucose-Capillary: 183 mg/dL — ABNORMAL HIGH (ref 70–99)

## 2018-05-31 LAB — COMPREHENSIVE METABOLIC PANEL
ALK PHOS: 34 U/L — AB (ref 38–126)
ALT: 7 U/L (ref 0–44)
AST: 15 U/L (ref 15–41)
Albumin: 1.7 g/dL — ABNORMAL LOW (ref 3.5–5.0)
Anion gap: 11 (ref 5–15)
BUN: 34 mg/dL — ABNORMAL HIGH (ref 8–23)
CALCIUM: 7.8 mg/dL — AB (ref 8.9–10.3)
CO2: 30 mmol/L (ref 22–32)
CREATININE: 1.94 mg/dL — AB (ref 0.44–1.00)
Chloride: 99 mmol/L (ref 98–111)
GFR, EST AFRICAN AMERICAN: 30 mL/min — AB (ref >60)
GFR, EST NON AFRICAN AMERICAN: 26 mL/min — AB (ref >60)
Glucose, Bld: 173 mg/dL — ABNORMAL HIGH (ref 70–99)
Potassium: 3.1 mmol/L — ABNORMAL LOW (ref 3.5–5.1)
Sodium: 140 mmol/L (ref 135–145)
Total Bilirubin: 0.8 mg/dL (ref 0.3–1.2)
Total Protein: 4.8 g/dL — ABNORMAL LOW (ref 6.5–8.1)

## 2018-05-31 LAB — C DIFFICILE QUICK SCREEN W PCR REFLEX
C Diff antigen: NEGATIVE
C Diff interpretation: NOT DETECTED
C Diff toxin: NEGATIVE

## 2018-05-31 LAB — VITAMIN B12: VITAMIN B 12: 307 pg/mL (ref 180–914)

## 2018-05-31 LAB — CBC
HCT: 24.7 % — ABNORMAL LOW (ref 36.0–46.0)
HEMOGLOBIN: 7.7 g/dL — AB (ref 12.0–15.0)
MCH: 30.8 pg (ref 26.0–34.0)
MCHC: 31.2 g/dL (ref 30.0–36.0)
MCV: 98.8 fL (ref 78.0–100.0)
Platelets: 207 10*3/uL (ref 150–400)
RBC: 2.5 MIL/uL — AB (ref 3.87–5.11)
RDW: 16.5 % — ABNORMAL HIGH (ref 11.5–15.5)
WBC: 8.2 10*3/uL (ref 4.0–10.5)

## 2018-05-31 LAB — ANA W/REFLEX IF POSITIVE: Anti Nuclear Antibody(ANA): NEGATIVE

## 2018-05-31 LAB — CALCIUM, IONIZED: Calcium, Ionized, Serum: 4.3 mg/dL — ABNORMAL LOW (ref 4.5–5.6)

## 2018-05-31 LAB — IGG 4: IgG, Subclass 4: 13 mg/dL (ref 2–96)

## 2018-05-31 LAB — LIPASE, BLOOD: LIPASE: 162 U/L — AB (ref 11–51)

## 2018-05-31 MED ORDER — VITAL 1.5 CAL PO LIQD
1000.0000 mL | ORAL | Status: DC
  Filled 2018-05-31: qty 1000

## 2018-05-31 MED ORDER — VITAL AF 1.2 CAL PO LIQD
1500.0000 mL | ORAL | Status: DC
  Administered 2018-05-31 – 2018-06-02 (×2): 1500 mL
  Filled 2018-05-31 (×3): qty 1500

## 2018-05-31 MED ORDER — INSULIN ASPART 100 UNIT/ML ~~LOC~~ SOLN
0.0000 [IU] | SUBCUTANEOUS | Status: DC
  Administered 2018-05-31: 3 [IU] via SUBCUTANEOUS
  Administered 2018-06-01 (×2): 2 [IU] via SUBCUTANEOUS
  Administered 2018-06-01 (×2): 3 [IU] via SUBCUTANEOUS
  Administered 2018-06-01 – 2018-06-02 (×7): 2 [IU] via SUBCUTANEOUS
  Administered 2018-06-03: 1 [IU] via SUBCUTANEOUS
  Administered 2018-06-03: 3 [IU] via SUBCUTANEOUS
  Administered 2018-06-03 (×2): 2 [IU] via SUBCUTANEOUS
  Administered 2018-06-03: 3 [IU] via SUBCUTANEOUS
  Administered 2018-06-03 – 2018-06-04 (×2): 2 [IU] via SUBCUTANEOUS
  Administered 2018-06-04: 3 [IU] via SUBCUTANEOUS
  Administered 2018-06-04 (×2): 2 [IU] via SUBCUTANEOUS
  Administered 2018-06-04 – 2018-06-05 (×3): 1 [IU] via SUBCUTANEOUS
  Administered 2018-06-05: 2 [IU] via SUBCUTANEOUS
  Administered 2018-06-05 – 2018-06-06 (×4): 1 [IU] via SUBCUTANEOUS
  Administered 2018-06-06: 2 [IU] via SUBCUTANEOUS
  Administered 2018-06-06 – 2018-06-07 (×4): 1 [IU] via SUBCUTANEOUS
  Administered 2018-06-07 (×3): 2 [IU] via SUBCUTANEOUS
  Administered 2018-06-08 (×2): 1 [IU] via SUBCUTANEOUS
  Administered 2018-06-09: 2 [IU] via SUBCUTANEOUS
  Administered 2018-06-10: 1 [IU] via SUBCUTANEOUS
  Administered 2018-06-10: 2 [IU] via SUBCUTANEOUS
  Administered 2018-06-10 – 2018-06-11 (×2): 1 [IU] via SUBCUTANEOUS
  Administered 2018-06-11 – 2018-06-14 (×4): 2 [IU] via SUBCUTANEOUS
  Administered 2018-06-14: 1 [IU] via SUBCUTANEOUS
  Administered 2018-06-14 – 2018-06-15 (×5): 2 [IU] via SUBCUTANEOUS
  Administered 2018-06-16 (×2): 1 [IU] via SUBCUTANEOUS
  Administered 2018-06-16: 2 [IU] via SUBCUTANEOUS
  Administered 2018-06-17 (×2): 1 [IU] via SUBCUTANEOUS
  Administered 2018-06-17 – 2018-06-18 (×2): 2 [IU] via SUBCUTANEOUS
  Administered 2018-06-18: 1 [IU] via SUBCUTANEOUS
  Administered 2018-06-18 (×2): 2 [IU] via SUBCUTANEOUS
  Administered 2018-06-19: 1 [IU] via SUBCUTANEOUS
  Administered 2018-06-19 (×2): 2 [IU] via SUBCUTANEOUS
  Administered 2018-06-19: 1 [IU] via SUBCUTANEOUS
  Administered 2018-06-20: 2 [IU] via SUBCUTANEOUS
  Administered 2018-06-20: 1 [IU] via SUBCUTANEOUS
  Administered 2018-06-21 – 2018-06-22 (×4): 2 [IU] via SUBCUTANEOUS
  Administered 2018-06-22 – 2018-06-23 (×3): 1 [IU] via SUBCUTANEOUS
  Administered 2018-06-23: 2 [IU] via SUBCUTANEOUS
  Administered 2018-06-23: 1 [IU] via SUBCUTANEOUS
  Administered 2018-06-23: 2 [IU] via SUBCUTANEOUS
  Administered 2018-06-24: 1 [IU] via SUBCUTANEOUS
  Administered 2018-06-24: 2 [IU] via SUBCUTANEOUS
  Administered 2018-06-24: 9 [IU] via SUBCUTANEOUS
  Administered 2018-06-24 – 2018-06-25 (×2): 2 [IU] via SUBCUTANEOUS
  Administered 2018-06-25: 1 [IU] via SUBCUTANEOUS
  Administered 2018-06-26: 2 [IU] via SUBCUTANEOUS

## 2018-05-31 MED ORDER — PANCRELIPASE (LIP-PROT-AMYL) 12000-38000 UNITS PO CPEP
12000.0000 [IU] | ORAL_CAPSULE | Freq: Three times a day (TID) | ORAL | Status: DC
  Administered 2018-05-31 – 2018-06-26 (×62): 12000 [IU] via ORAL
  Filled 2018-05-31 (×71): qty 1

## 2018-05-31 NOTE — Progress Notes (Signed)
Contacted NP concerning blood sugar for patient. Awaiting any further orders

## 2018-05-31 NOTE — Progress Notes (Signed)
Daily Rounding Note  05/31/2018, 8:49 AM  LOS: 3 days   SUBJECTIVE:   Chief complaint: abdominal pain, pancreatitis TF rate now at 40 ml/hour.  LR IVF at 50/hour.   Loose, watery diarrhea all night long Pain all over but greatest in right abdomen.  If she is very still, pain subsides.  Having breakthrough to Dilaudid.    OBJECTIVE:         Vital signs in last 24 hours:    Temp:  [97.8 F (36.6 C)-98.6 F (37 C)] 98.4 F (36.9 C) (09/25 0747) Pulse Rate:  [62-68] 62 (09/25 0814) Resp:  [16-19] 16 (09/25 0814) BP: (129-143)/(41-47) 131/41 (09/25 0747) SpO2:  [97 %-99 %] 98 % (09/25 0814) FiO2 (%):  [28 %] 28 % (09/25 0814) Weight:  [56.6 kg] 56.6 kg (09/24 1653) Last BM Date: 05/30/18 Filed Weights   05/29/18 2048 05/30/18 1653  Weight: 56.6 kg 56.6 kg   General: frail, weak, cachectic; looks like months to years of chronic illness   Heart: RRR Chest: clear bil.  No dyspnea Abdomen: examined while on BS commode.  ND.  Hypoactive BS.  Less tender on left side  Extremities: thin, muscle wasting in stick-like limbs Neuro/Psych:  Pleasant, cooperative.  Alert.  Oriented x 3.    Intake/Output from previous day: 09/24 0701 - 09/25 0700 In: 2244 [P.O.:420; I.V.:157.2; NG/GT:1367.3; IV Piggyback:299.5] Out: 0   Intake/Output this shift: No intake/output data recorded.  Lab Results: Recent Labs    05/29/18 1528 05/30/18 0236 05/31/18 0522  WBC 14.1* 14.4* 8.2  HGB 8.2* 8.4* 7.7*  HCT 26.7* 27.1* 24.7*  PLT 218 220 207   BMET Recent Labs    05/29/18 1528 05/30/18 0236 05/31/18 0522  NA 141 140 140  K 3.7 4.1 3.1*  CL 100 100 99  CO2 _0 GLUCOSE 85 149* 173*  BUN 32* 35* 34*  CREATININE 1.98* 2.27* 1.94*  CALCIUM 7.8* 7.7* 7.8*   LFT Recent Labs    05/29/18 1528 05/30/18 0236 05/31/18 0522  PROT 4.5* 4.9* 4.8*  ALBUMIN 1.9* 1.9* 1.7*  AST _1 ALT _2 ALKPHOS 36* 36* 34*    BILITOT 1.4* 1.8* 0.8   PT/INR Recent Labs    05/28/18 2325  LABPROT 14.4  INR 1.13   Hepatitis Panel No results for input(s): HEPBSAG, HCVAB, HEPAIGM, HEPBIGM in the last 72 hours.  Studies/Results: Dg Abd 2 Views  Result Date: 05/30/2018 CLINICAL DATA:  History of pancreatitis.  Abdominal pain. EXAM: ABDOMEN - 2 VIEW COMPARISON:  Chest radiograph 05/29/2018 FINDINGS: Feeding tube tip projects at the gastric antrum/proximal duodenum, unchanged. Aortic stent graft material. Monitoring leads overlie the patient. Gas is demonstrated within nondilated loops of large and small bowel. Small left pleural effusion. Heterogeneous opacities left lung base. IMPRESSION: Feeding tube projects at the gastric antrum/proximal duodenum, unchanged. Decreased gaseous distention of the stomach and small bowel. Electronically Signed   By: Lovey Newcomer M.D.   On: 05/30/2018 10:49   Dg Abd Portable 1v  Result Date: 05/29/2018 CLINICAL DATA:  Feeding tube placement EXAM: PORTABLE ABDOMEN - 1 VIEW COMPARISON:  None. FINDINGS: Gaseous distension of the stomach and proximal small bowel. Feeding tube with the tip projecting over antrum versus proximal duodenum. No evidence of pneumoperitoneum, portal venous gas or pneumatosis. No pathologic calcifications along the expected course of the ureters. Aorto bi-iliac stent graft is noted. Left renal artery stent  is noted. Osseous structures are unremarkable. IMPRESSION: Gaseous distension of the stomach and proximal small bowel. Feeding tube with the tip projecting over antrum versus proximal duodenum. Electronically Signed   By: Kathreen Devoid   On: 05/29/2018 15:30    ASSESMENT:   * Recurrent vs never resolved acute pancreatitis. S/p 04/19/18 lap chole for presumed biliary pancreatitis. Minor elevation T bili, o/w normal LFTS.  No fevers, improved..  Elevated ESR @ 43 (rr 0-22), normal CRP, IgG 4.  ANA pending.     WBCs improved.    * CHF. Elevated BNP.   Cardiolgist attributes mild elevationTroponin to demand ischemia.    * GIB, acute on chronic anemia 04/2018.  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 FOBT +9/24.   Set for colonoscopy 07/03/18.  *Chronic anemia. Per iron studies of 04/10/2018,not iron deficient.PO iron stopped in setting of acute illness.   Borderline macrocytosis, Normal B12, Folate pending.   Stable Hgb within historic range.    *   Hyperglycemia.  *   Hypokalemia.    *   Hypocalcemia and hypoalbuminemia.   ionized CA still processing.  chronic malunutriton by physical exam.      * ASPVD, CAD. Chronic Plavix, not on hold.   * S/p 2013 colon resection for complicated diverticular dz.   * Worsening renal failure, baseline stage 5 CKD. S/p fistula creation for eventual hemodialysis.  400 ml urine recorded 9/23.    * Small RUL lung nodule. Paratracheal and subcarinal adenopathy. Per CT.     PLAN   *  Suggest PCA pump.    *   Ordered C diff study.  If negative will add pancrease.  Stopped Colace.  Leave TF at 40/hour.      [name]  05/31/2018, 8:49 AM Phone [phone]

## 2018-05-31 NOTE — Care Management Important Message (Signed)
Important Message  Patient Details  Name: [name] MRN: [id number] Date of Birth: November 07, 1950   Medicare Important Message Given:  Yes    [name] 05/31/2018, 4:18 PM

## 2018-05-31 NOTE — Care Management Important Message (Signed)
Important Message  Patient Details  Name: [name] MRN: [id number] Date of Birth: Sep 22, 1950   Medicare Important Message Given:  Yes    [name] 05/31/2018, 4:18 PM

## 2018-05-31 NOTE — Progress Notes (Signed)
Pharmacy Antibiotic Note  [name] is a 67 y.o. female admitted on 05/28/2018 with sepsis.  Pharmacy has been consulted for zosyn dosing.  Day #4 of Zosyn for empiric intra-abd coverage. Afebrile, WBC down to wnl.  Plan: Zosyn 3.375g IV q8 F/u LOT, micro data, pt's clinical condition Consider 5 days of Zosyn and then stop tomorrow?  Height: 5\' 5"  (165.1 cm) Weight: 124 lb 12.5 oz (56.6 kg) IBW/kg (Calculated) : 57  Temp (24hrs), Avg:98.3 F (36.8 C), Min:97.8 F (36.6 C), Max:98.6 F (37 C)  Recent Labs  Lab 05/28/18 1507 05/28/18 1809 05/28/18 2325 05/29/18 0335 05/29/18 1528 05/30/18 0236 05/31/18 0522  WBC 23.7*  --   --  14.5* 14.1* 14.4* 8.2  CREATININE 1.80*  --   --  1.91* 1.98* 2.27* 1.94*  LATICACIDVEN  --  1.12 1.0 0.8  --   --   --     Estimated Creatinine Clearance: 25.8 mL/min (A) (by C-G formula based on SCr of 1.94 mg/dL (H)).    Allergies  Allergen Reactions  . Isosorbide Other (See Comments)    Can only tolerate in low doses  . Isosorbide Nitrate Other (See Comments)    Can only tolerate in low doses  . Oxycodone-Acetaminophen Itching  . Codeine Itching  . Contrast Media [Iodinated Diagnostic Agents] Itching and Other (See Comments)    Itching of feet  . Ioxaglate Itching and Other (See Comments)    Itching of feet  . Metrizamide Itching and Other (See Comments)    Itching of feet  . Percocet [Oxycodone-Acetaminophen] Itching and Other (See Comments)    Tolerates Hydrocodone    Elenor Quinones, PharmD, BCPS Clinical Pharmacist Phone number [phone] 05/31/2018 8:45 AM

## 2018-05-31 NOTE — Progress Notes (Signed)
[facility]                                                                              Patient Demographics  [name], is a 67 y.o. female, DOB - 1951/05/27, PPI:[id number]  Admit date - 05/28/2018   Admitting Physician Ivor Costa, MD  Outpatient Primary MD for the patient is Bernerd Limbo, MD  Outpatient specialists:   LOS - 3  days   Medical records reviewed and are as summarized below:    Chief Complaint  Patient presents with  . Abdominal Pain  . Emesis       Brief summary   Patient is a 67 year old female with hypertension, hyperlipidemia, diabetesCOPD on home oxygen, GERD, gout, depression with anxiety, tobacco abuse, atrial fibrillation not on anticoagulants, AAA, CAD, CABG, CHF, CKD-4, GI bleeding, mitral valve regurgitation, PAD who presented with abdominal pain. Patient was recently hospitalized from 8/4-8/16 due to GI bleeding required blood transfusion and gallstone pancreatitis.  Patient underwent laparoscopic cholecystectomy.  She was discharged in stable condition.  Patient presented back with nausea vomiting abdominal pain, poor oral intake.    Assessment & Plan    Principal Problem:   Pancreatitis, recurrent with abdominal pain nausea and vomiting with pseudocyst -Status post cholecystectomy last month on 8/14 -GI following closely, CT abdomen pelvis showed worsening severe inflammatory changes surrounding the pancreatic head and neck, descending duodenum, hepatic flexure of colon.  New complex fluid collection inferior to the gallbladder fossa with layering hyperdense material, favor worsening pancreatitis with enlarging pancreatic pseudocyst. 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  Cardiology following recommended outpatient stress test, not a candidate for any invasive cardiac catheterization at this point -2D echo showed normal LV function, moderate mitral stenosis, mild MR  History of AAA repair in 0/6301 complicated with retroperitoneal bleed  COPD, tobacco use -Currently no wheezing, continue nicotine patch  Code Status: full  DVT Prophylaxis:  SCD's Family Communication: Discussed in detail with the patient, all imaging results, lab results explained to the patient  Disposition Plan: Not medically ready  Time Spent in minutes  40mins   Procedures:  CT abdomen 2D echo  Consultants:   GI Cardiology  Antimicrobials:      Medications  Scheduled Meds: . allopurinol  100 mg Oral Daily  . ALPRAZolam  0.5 mg Oral QHS  . amLODipine  10 mg Oral Daily  . atorvastatin  40 mg Oral QPM  . carvedilol  25 mg Oral BID WC  . cholecalciferol  5,000 Units Oral Daily  .  cloNIDine  0.2 mg Oral TID  . clopidogrel  75 mg Oral Q breakfast  . DULoxetine  60 mg Oral Daily  . famotidine  20 mg Oral QAC supper  . fenofibrate  160 mg Oral QHS  . gabapentin  300 mg Oral BID  . heparin  5,000 Units Subcutaneous Q8H  . hydrALAZINE  100 mg Oral TID  . isosorbide mononitrate  15 mg Oral Daily  . mirtazapine  7.5 mg Oral QHS  . mometasone-formoterol  2 puff Inhalation BID  . nicotine  21 mg Transdermal Daily  . pantoprazole sodium  40 mg Per Tube Daily   Continuous Infusions: . sodium chloride 1,000 mL (05/31/18 0359)  . feeding supplement (VITAL AF 1.2  CAL) 1,500 mL (05/31/18 0359)  . lactated ringers 50 mL/hr at 05/31/18 0357  . piperacillin-tazobactam (ZOSYN)  IV 3.375 g (05/31/18 1356)   PRN Meds:.sodium chloride, acetaminophen **OR** acetaminophen, hydrALAZINE, HYDROcodone-acetaminophen, HYDROmorphone (DILAUDID) injection, hydrOXYzine, levalbuterol, ondansetron (ZOFRAN) IV, polyethylene glycol, zolpidem   Antibiotics   Anti-infectives (From admission, onward)   Start     Dose/Rate Route Frequency Ordered Stop   05/29/18 0600  piperacillin-tazobactam (ZOSYN) IVPB 3.375 g     3.375 g 12.5 mL/hr over 240 Minutes Intravenous Every 8 hours 05/28/18 2227     05/28/18 2230  piperacillin-tazobactam (ZOSYN) IVPB 3.375 g     3.375 g 100 mL/hr over 30 Minutes Intravenous  Once 05/28/18 2227 05/29/18 0011        Subjective:   [name] was seen and examined today.  Very weak and frail, diarrhea, still having abdominal pain.  No vomiting.  No fevers or chills   Objective:   Vitals:   05/31/18 0406 05/31/18 0406 05/31/18 0747 05/31/18 0814  BP: (!) 143/44 (!) 143/44 (!) 131/41   Pulse: 68 68 63 62  Resp: 16 16 16 16   Temp: 98.2 F (36.8 C) 98.2 F (36.8 C) 98.4 F (36.9 C)   TempSrc: Oral Oral Oral   SpO2: 98% 98% 98% 98%  Weight:      Height:        Intake/Output Summary (Last 24 hours) at 05/31/2018 1455 Last data filed at 05/31/2018 1400 Gross per 24 hour  Intake 2244 ml  Output 450 ml  Net 1794 ml     Wt Readings from Last 3 Encounters:  05/30/18 56.6 kg  05/15/18 55.8 kg  04/28/18 60.3 kg     Exam  General: Alert and oriented x 3, frail, ill-appearing, uncomfortable  Eyes:   HEENT:  Atraumatic, normocephalic, cortrack +  Cardiovascular: S1 S2 auscultated, 2/6 systolic murmur at left sternal border, RRR  Respiratory: Decreased breath sound at the bases  Gastrointestinal: Soft, right-sided abdominal tenderness, guarding, positive BS   Ext: no pedal edema bilaterally  Neuro: no new  deficit  Musculoskeletal: No digital cyanosis, clubbing  Skin: No rashes  Psych: oriented x3, frail-appearing   Data Reviewed:  I have personally reviewed following labs and imaging studies  Micro Results Recent Results (from the past 240 hour(s))  Blood culture (routine x 2)     Status: None (Preliminary result)   Collection Time: 05/28/18  5:54 PM  Result Value Ref Range Status   Specimen Description BLOOD RIGHT ANTECUBITAL  Final   Special Requests   Final    BOTTLES DRAWN AEROBIC AND ANAEROBIC Blood Culture results may not be optimal due to an excessive volume of blood received in culture bottles   Culture   Final  NO GROWTH 3 DAYS Performed at [facility], [place] [address], [place], [place] [address]    Report Status PENDING  Incomplete  Blood culture (routine x 2)     Status: None (Preliminary result)   Collection Time: 05/28/18  6:35 PM  Result Value Ref Range Status   Specimen Description BLOOD BLOOD RIGHT FOREARM  Final   Special Requests   Final    BOTTLES DRAWN AEROBIC ONLY Blood Culture results may not be optimal due to an inadequate volume of blood received in culture bottles   Culture   Final    NO GROWTH 3 DAYS Performed at [facility], [place] [address], [place], [place] [address]    Report Status PENDING  Incomplete  MRSA PCR Screening     Status: None   Collection Time: 05/30/18  2:53 PM  Result Value Ref Range Status   MRSA by PCR NEGATIVE NEGATIVE Final    Comment:        The GeneXpert MRSA Assay (FDA approved for NASAL specimens only), is one component of a comprehensive MRSA colonization surveillance program. It is not intended to diagnose MRSA infection nor to guide or monitor treatment for MRSA infections. Performed at [facility], [place] [address], [place], [place] [address]   C difficile quick scan w PCR reflex     Status: None   Collection Time: 05/31/18  9:11 AM  Result Value Ref Range Status   C Diff antigen NEGATIVE  NEGATIVE Final   C Diff toxin NEGATIVE NEGATIVE Final   C Diff interpretation No C. difficile detected.  Final    Comment: Performed at [facility], [place] [address], [place], [place] [address]    Radiology Reports Ct Abdomen Pelvis Wo Contrast  Result Date: 05/28/2018 CLINICAL DATA:  New tiny right lung nodule on chest radiograph. Severe right lower quadrant abdominal pain with nausea, vomiting and diarrhea. Chronic kidney disease. Prior cholecystectomy 04/19/2018, hysterectomy and appendectomy. Prior bowel resection. EXAM: CT CHEST, ABDOMEN AND PELVIS WITHOUT CONTRAST TECHNIQUE: Multidetector CT imaging of the chest, abdomen and pelvis was performed following the standard protocol without IV contrast. COMPARISON:  Chest radiograph from earlier today. FINDINGS: CT CHEST FINDINGS Cardiovascular: Borderline mild cardiomegaly. No significant pericardial effusion/thickening. 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 Atherosclerotic nonaneurysmal thoracic aorta. Proximal left subclavian artery stent is noted. Dilated main pulmonary artery (4.0 cm diameter). Mediastinum/Nodes: No discrete thyroid nodules. Unremarkable esophagus. No axillary adenopathy. Mild right paratracheal adenopathy up to 1.1 cm (series 3/image 21). Mildly enlarged 1.1 cm subcarinal node (series 3/image 26). No discrete hilar adenopathy on this noncontrast scan. Lungs/Pleura: No pneumothorax. No pleural effusion. A few tiny calcified granulomas are present in the upper lobes bilaterally. No acute consolidative airspace disease, lung masses or significant pulmonary nodules. Diffuse interlobular septal thickening. Musculoskeletal: No aggressive appearing focal osseous lesions. Intact sternotomy wires. Moderate thoracic spondylosis. CT ABDOMEN PELVIS FINDINGS Hepatobiliary: Normal liver with no liver mass. Cholecystectomy. Bile ducts are within normal post cholecystectomy limits with CBD diameter 7 mm. Pancreas:  Thickening of the pancreatic head and neck. Prominent peripancreatic fat stranding and ill-defined fluid in the pancreatic head and neck, worsened. Poorly defined 5.1 x 2.5 cm anterior pancreatic head complex fluid collection with heterogeneous internal density (series 3/image 66), increased from 2.3 x 1.2 cm. No definite pancreatic duct dilation. Severe wall thickening throughout the adjacent descending duodenum, worsened. Fluid collection inferior to the gallbladder fossa measures 5.0 x 3.0  cm (series 3/image 71) and demonstrates layering hyperdense material, new. Spleen: Normal size. No mass. Adrenals/Urinary Tract: Normal adrenals. No hydronephrosis. No renal stones. Partially exophytic simple 1.3 cm posterior interpolar left renal cyst. No additional contour deforming renal lesions. Normal bladder. Stomach/Bowel: Normal non-distended stomach. Normal caliber small bowel with no small bowel wall thickening. Appendectomy. New segmental wall thickening in the hepatic flexure of the colon. No additional sites of large bowel wall thickening. Stable postsurgical changes from distal large bowel resection. Vascular/Lymphatic: Atherosclerotic abdominal aorta with stable 5.0 cm infrarenal abdominal aortic aneurysm status post aorto bi-iliac stent graft repair. Surgical clips are noted in the right inguinal region. No pathologically enlarged lymph nodes in the abdomen or pelvis. Reproductive: Status post hysterectomy, with no abnormal findings at the vaginal cuff. No adnexal mass. Other: No pneumoperitoneum.  New small volume perihepatic ascites. Musculoskeletal: No aggressive appearing focal osseous lesions. Mild lumbar spondylosis. IMPRESSION: CT CHEST: 1. No lung masses or significant pulmonary nodules. 2. Borderline mild cardiomegaly. Diffuse interlobular septal thickening suggests mild pulmonary edema. No pleural effusions. 3. Dilated main pulmonary artery, suggesting pulmonary arterial hypertension. CT ABDOMEN  PELVIS: 1. Worsening severe inflammatory changes surrounding the pancreatic head and neck, descending duodenum and hepatic flexure of the colon. Enlarging nonspecific complex poorly defined fluid collection in the anterior pancreatic head. New complex fluid collection inferior to the gallbladder fossa with layering hyperdense material. Burtis Junes worsening pancreatitis with enlarging pancreatic pseudocysts. Marked wall thickening in the descending duodenum has worsened, and could be reactive, with peptic ulcer disease difficult to exclude. No free air. Short-term follow-up MRI or CT abdomen with IV contrast suggested. 2. New wall thickening throughout the hepatic flexure of the colon, probably reactive. 3. Cholecystectomy. No intrahepatic biliary ductal dilatation. CBD diameter 7 mm, within normal post cholecystectomy limits. 4. New small volume perihepatic ascites. 5.  Aortic Atherosclerosis (ICD10-I70.0). Electronically Signed   By: Ilona Sorrel M.D.   On: 05/28/2018 20:41   Ct Abdomen Pelvis Wo Contrast  Result Date: 05/16/2018 CLINICAL DATA:  Acute abdominal pain.  Cholecystectomy last month. EXAM: CT ABDOMEN AND PELVIS WITHOUT CONTRAST TECHNIQUE: Multidetector CT imaging of the abdomen and pelvis was performed following the standard protocol without IV contrast. COMPARISON:  Multiple prior exams most recent CT 04/18/2018 FINDINGS: Lower chest: Improved pleural effusions with small right greater than left residual. Mild septal thickening at the lung bases. Coronary artery calcifications. Mild cardiomegaly. Hepatobiliary: No focal hepatic lesion on noncontrast exam. Postcholecystectomy with clips in the gallbladder fossa. Small hyperdensity in the gallbladder fossa may be cystic duct remnant with small stone. No biliary dilatation. Pancreas: Similar appearance of peripancreatic fat stranding with stranding involving the region of the duodenum. Previously seen fluid collections in the pancreatic head are not  definitively visualized, lack of IV contrast limits detailed assessment. No evidence of progressive fluid collection. No ductal dilatation. Spleen: Normal in size. Adrenals/Urinary Tract: Mild left adrenal thickening without dominant nodule. Right adrenal gland is normal. Calcifications of both renal hila may be vascular or nonobstructing stones, grossly unchanged from prior exam. No hydronephrosis. Small low-density lesion arising from the left kidney, likely cysts. Urinary bladder is nondistended and not well evaluated. Stomach/Bowel: Again seen wall thickening about the distal stomach and duodenum, not well assessed in the absence of contrast. Degree of stranding has slightly increased from prior exam. No other small or large bowel inflammation or wall thickening. Enteric sutures in the sigmoid colon. Prior appendectomy. Vascular/Lymphatic: Aorto bi-iliac stent graft. Residual aneurysmal diameter of 5.1 cm,  unchanged. Stent at the origin of both renal arteries. Aortic and peripheral atherosclerosis. Diffuse atherosclerosis of external iliac arteries. Reproductive: Status post hysterectomy. No adnexal masses. Other: Fluid collection in the left retroperitoneum inferior to the left kidney and lateral to the psoas muscle currently measures 4.5 x 3.4 cm, previously 5.5 x 4 cm. No internal air. Upper abdominal stranding and edema. No frank ascites. No free air. Musculoskeletal: There are no acute or suspicious osseous abnormalities. IMPRESSION: 1. Persistent fat stranding and poorly defined fat planes in the region of the pancreatic head and duodenum. Favor persistent or recurrent pancreatitis over duodenitis. Previous small fluid collections in the region of the pancreatic head are not currently visualized and may have resolved, lack of contrast limits assessment. If the patient can tolerate breath hold technique, pancreatic protocol MRI may be of value to further assess. 2. Interval cholecystectomy. Small  hyperdensity in the gallbladder fossa may be gallstone within the cystic duct remnant, no evidence of inflammatory change. 3. Decreasing size of left retroperitoneal hematoma. 4. Decreased bilateral pleural effusions from prior exam with mild residual. Septal thickening in the lung bases suspicious for pulmonary edema. 5. Aorto bi-iliac stent graft repair of abdominal aortic aneurysm with unchanged residual aortic diameter. Diffuse aortic and branch atherosclerosis. Aortic Atherosclerosis (ICD10-I70.0). 6. Electronically Signed   By: Keith Rake M.D.   On: 05/16/2018 07:07   Ct Chest Wo Contrast  Result Date: 05/28/2018 CLINICAL DATA:  New tiny right lung nodule on chest radiograph. Severe right lower quadrant abdominal pain with nausea, vomiting and diarrhea. Chronic kidney disease. Prior cholecystectomy 04/19/2018, hysterectomy and appendectomy. Prior bowel resection. EXAM: CT CHEST, ABDOMEN AND PELVIS WITHOUT CONTRAST TECHNIQUE: Multidetector CT imaging of the chest, abdomen and pelvis was performed following the standard protocol without IV contrast. COMPARISON:  Chest radiograph from earlier today. FINDINGS: CT CHEST FINDINGS Cardiovascular: Borderline mild cardiomegaly. No significant pericardial effusion/thickening. 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 Atherosclerotic nonaneurysmal thoracic aorta. Proximal left subclavian artery stent is noted. Dilated main pulmonary artery (4.0 cm diameter). Mediastinum/Nodes: No discrete thyroid nodules. Unremarkable esophagus. No axillary adenopathy. Mild right paratracheal adenopathy up to 1.1 cm (series 3/image 21). Mildly enlarged 1.1 cm subcarinal node (series 3/image 26). No discrete hilar adenopathy on this noncontrast scan. Lungs/Pleura: No pneumothorax. No pleural effusion. A few tiny calcified granulomas are present in the upper lobes bilaterally. No acute consolidative airspace disease, lung masses or significant pulmonary  nodules. Diffuse interlobular septal thickening. Musculoskeletal: No aggressive appearing focal osseous lesions. Intact sternotomy wires. Moderate thoracic spondylosis. CT ABDOMEN PELVIS FINDINGS Hepatobiliary: Normal liver with no liver mass. Cholecystectomy. Bile ducts are within normal post cholecystectomy limits with CBD diameter 7 mm. Pancreas: Thickening of the pancreatic head and neck. Prominent peripancreatic fat stranding and ill-defined fluid in the pancreatic head and neck, worsened. Poorly defined 5.1 x 2.5 cm anterior pancreatic head complex fluid collection with heterogeneous internal density (series 3/image 66), increased from 2.3 x 1.2 cm. No definite pancreatic duct dilation. Severe wall thickening throughout the adjacent descending duodenum, worsened. Fluid collection inferior to the gallbladder fossa measures 5.0 x 3.0 cm (series 3/image 71) and demonstrates layering hyperdense material, new. Spleen: Normal size. No mass. Adrenals/Urinary Tract: Normal adrenals. No hydronephrosis. No renal stones. Partially exophytic simple 1.3 cm posterior interpolar left renal cyst. No additional contour deforming renal lesions. Normal bladder. Stomach/Bowel: Normal non-distended stomach. Normal caliber small bowel with no small bowel wall thickening. Appendectomy. New segmental wall thickening in the hepatic flexure  of the colon. No additional sites of large bowel wall thickening. Stable postsurgical changes from distal large bowel resection. Vascular/Lymphatic: Atherosclerotic abdominal aorta with stable 5.0 cm infrarenal abdominal aortic aneurysm status post aorto bi-iliac stent graft repair. Surgical clips are noted in the right inguinal region. No pathologically enlarged lymph nodes in the abdomen or pelvis. Reproductive: Status post hysterectomy, with no abnormal findings at the vaginal cuff. No adnexal mass. Other: No pneumoperitoneum.  New small volume perihepatic ascites. Musculoskeletal: No aggressive  appearing focal osseous lesions. Mild lumbar spondylosis. IMPRESSION: CT CHEST: 1. No lung masses or significant pulmonary nodules. 2. Borderline mild cardiomegaly. Diffuse interlobular septal thickening suggests mild pulmonary edema. No pleural effusions. 3. Dilated main pulmonary artery, suggesting pulmonary arterial hypertension. CT ABDOMEN PELVIS: 1. Worsening severe inflammatory changes surrounding the pancreatic head and neck, descending duodenum and hepatic flexure of the colon. Enlarging nonspecific complex poorly defined fluid collection in the anterior pancreatic head. New complex fluid collection inferior to the gallbladder fossa with layering hyperdense material. Burtis Junes worsening pancreatitis with enlarging pancreatic pseudocysts. Marked wall thickening in the descending duodenum has worsened, and could be reactive, with peptic ulcer disease difficult to exclude. No free air. Short-term follow-up MRI or CT abdomen with IV contrast suggested. 2. New wall thickening throughout the hepatic flexure of the colon, probably reactive. 3. Cholecystectomy. No intrahepatic biliary ductal dilatation. CBD diameter 7 mm, within normal post cholecystectomy limits. 4. New small volume perihepatic ascites. 5.  Aortic Atherosclerosis (ICD10-I70.0). Electronically Signed   By: Ilona Sorrel M.D.   On: 05/28/2018 20:41   Dg Chest Portable 1 View  Result Date: 05/28/2018 CLINICAL DATA:  Severe right lower quadrant pain. EXAM: PORTABLE CHEST 1 VIEW COMPARISON:  04/18/2018 FINDINGS: The lungs are clear without focal pneumonia, edema, pneumothorax or pleural effusion. Interstitial markings are diffusely coarsened with chronic features. Tiny nodular density identified right upper lung, superimposed on anterior right second rib. Patient is status post median sternotomy. Left atrial appendage occluded device noted. Vascular stent superimposed on the transverse aorta. The visualized bony structures of the thorax are intact.  IMPRESSION: Cardiomegaly with mild vascular congestion or chronic interstitial coarsening. Tiny nodule right upper lung not visible on prior studies and indeterminate. CT chest without contrast could be used to further evaluate. Electronically Signed   By: Misty Stanley M.D.   On: 05/28/2018 17:23   Dg Abd 2 Views  Result Date: 05/30/2018 CLINICAL DATA:  History of pancreatitis.  Abdominal pain. EXAM: ABDOMEN - 2 VIEW COMPARISON:  Chest radiograph 05/29/2018 FINDINGS: Feeding tube tip projects at the gastric antrum/proximal duodenum, unchanged. Aortic stent graft material. Monitoring leads overlie the patient. Gas is demonstrated within nondilated loops of large and small bowel. Small left pleural effusion. Heterogeneous opacities left lung base. IMPRESSION: Feeding tube projects at the gastric antrum/proximal duodenum, unchanged. Decreased gaseous distention of the stomach and small bowel. Electronically Signed   By: Lovey Newcomer M.D.   On: 05/30/2018 10:49   Dg Abd Portable 1v  Result Date: 05/29/2018 CLINICAL DATA:  Feeding tube placement EXAM: PORTABLE ABDOMEN - 1 VIEW COMPARISON:  None. FINDINGS: Gaseous distension of the stomach and proximal small bowel. Feeding tube with the tip projecting over antrum versus proximal duodenum. No evidence of pneumoperitoneum, portal venous gas or pneumatosis. No pathologic calcifications along the expected course of the ureters. Aorto bi-iliac stent graft is noted. Left renal artery stent is noted. Osseous structures are unremarkable. IMPRESSION: Gaseous distension of the stomach and proximal small bowel. Feeding tube with  the tip projecting over antrum versus proximal duodenum. Electronically Signed   By: Kathreen Devoid   On: 05/29/2018 15:30    Lab Data:  CBC: Recent Labs  Lab 05/28/18 1507 05/29/18 0335 05/29/18 1528 05/30/18 0236 05/31/18 0522  WBC 23.7* 14.5* 14.1* 14.4* 8.2  NEUTROABS  --   --  12.9*  --   --   HGB 11.0* 8.4* 8.2* 8.4* 7.7*  HCT  34.2* 26.6* 26.7* 27.1* 24.7*  MCV 97.7 98.2 100.4* 100.0 98.8  PLT 339 215 218 220 449   Basic Metabolic Panel: Recent Labs  Lab 05/28/18 1507 05/29/18 0335 05/29/18 1528 05/30/18 0236 05/31/18 0522  NA 141 142 141 140 140  K 3.1* 3.0* 3.7 4.1 3.1*  CL 97* 101 100 100 99  CO2 27 28 25 24 30   GLUCOSE 178* 102* 85 149* 173*  BUN 27* 31* 32* 35* 34*  CREATININE 1.80* 1.91* 1.98* 2.27* 1.94*  CALCIUM 8.1* 7.4* 7.8* 7.7* 7.8*  MG  --  1.3* 2.1 2.1  --    GFR: Estimated Creatinine Clearance: 25.8 mL/min (A) (by C-G formula based on SCr of 1.94 mg/dL (H)). Liver Function Tests: Recent Labs  Lab 05/28/18 1507 05/29/18 1528 05/30/18 0236 05/31/18 0522  AST 18 20 17 15   ALT 8 7 8 7   ALKPHOS 48 36* 36* 34*  BILITOT 1.7* 1.4* 1.8* 0.8  PROT 5.2* 4.5* 4.9* 4.8*  ALBUMIN 2.4* 1.9* 1.9* 1.7*   Recent Labs  Lab 05/28/18 1507 05/29/18 0335 05/30/18 0236 05/31/18 0522  LIPASE 399* 667* 270* 162*   No results for input(s): AMMONIA in the last 168 hours. Coagulation Profile: Recent Labs  Lab 05/28/18 2325  INR 1.13   Cardiac Enzymes: Recent Labs  Lab 05/29/18 0335 05/29/18 0954 05/29/18 1528 05/29/18 2013 05/30/18 0236  TROPONINI 0.35* 0.52* 0.44* 0.34* 0.31*   BNP (last 3 results) No results for input(s): PROBNP in the last 8760 hours. HbA1C: Recent Labs    05/29/18 0335  HGBA1C 5.3   CBG: Recent Labs  Lab 05/30/18 2044 05/31/18 0043 05/31/18 0405 05/31/18 0749 05/31/18 1140  GLUCAP 155* 183* 166* 171* 216*   Lipid Profile: Recent Labs    05/29/18 0335  CHOL 85  HDL 20*  LDLCALC 38  TRIG 135  CHOLHDL 4.3   Thyroid Function Tests: No results for input(s): TSH, T4TOTAL, FREET4, T3FREE, THYROIDAB in the last 72 hours. Anemia Panel: Recent Labs    05/31/18 0522  VITAMINB12 307   Urine analysis:    Component Value Date/Time   COLORURINE YELLOW 05/28/2018 0506   APPEARANCEUR HAZY (A) 05/28/2018 0506   LABSPEC 1.021 05/28/2018 0506    PHURINE 5.0 05/28/2018 0506   GLUCOSEU NEGATIVE 05/28/2018 0506   HGBUR NEGATIVE 05/28/2018 0506   BILIRUBINUR NEGATIVE 05/28/2018 0506   KETONESUR 5 (A) 05/28/2018 0506   PROTEINUR NEGATIVE 05/28/2018 0506   UROBILINOGEN 0.2 03/04/2011 2107   NITRITE NEGATIVE 05/28/2018 0506   LEUKOCYTESUR SMALL (A) 05/28/2018 0506     Ripudeep Rai M.D. [facility] 05/31/2018, 2:55 PM  Pager: [phone] Between 7am to 7pm - call Pager - [phone]  After 7pm go to [email or web address] - password [email or web address]  Call night coverage person covering after 7pm

## 2018-05-31 NOTE — Progress Notes (Signed)
Patient only urinated approximately 100cc mixed with stool,bladder scan performed and got 83 cc in the bladder. K Kirby,NP text paged. Kiara Keep, Wonda Cheng, Therapist, sports

## 2018-06-01 DIAGNOSIS — D649 Anemia, unspecified: Secondary | ICD-10-CM

## 2018-06-01 LAB — COMPREHENSIVE METABOLIC PANEL
ALK PHOS: 36 U/L — AB (ref 38–126)
ALT: 5 U/L (ref 0–44)
ANION GAP: 9 (ref 5–15)
AST: 11 U/L — ABNORMAL LOW (ref 15–41)
Albumin: 1.5 g/dL — ABNORMAL LOW (ref 3.5–5.0)
BILIRUBIN TOTAL: 0.5 mg/dL (ref 0.3–1.2)
BUN: 33 mg/dL — ABNORMAL HIGH (ref 8–23)
CALCIUM: 7.2 mg/dL — AB (ref 8.9–10.3)
CO2: 29 mmol/L (ref 22–32)
Chloride: 99 mmol/L (ref 98–111)
Creatinine, Ser: 1.76 mg/dL — ABNORMAL HIGH (ref 0.44–1.00)
GFR calc non Af Amer: 29 mL/min — ABNORMAL LOW (ref >60)
GFR, EST AFRICAN AMERICAN: 34 mL/min — AB (ref >60)
Glucose, Bld: 184 mg/dL — ABNORMAL HIGH (ref 70–99)
POTASSIUM: 2.8 mmol/L — AB (ref 3.5–5.1)
SODIUM: 137 mmol/L (ref 135–145)
TOTAL PROTEIN: 4.4 g/dL — AB (ref 6.5–8.1)

## 2018-06-01 LAB — CBC
HCT: 21 % — ABNORMAL LOW (ref 36.0–46.0)
HEMOGLOBIN: 6.7 g/dL — AB (ref 12.0–15.0)
MCH: 31.3 pg (ref 26.0–34.0)
MCHC: 31.9 g/dL (ref 30.0–36.0)
MCV: 98.1 fL (ref 78.0–100.0)
Platelets: 178 10*3/uL (ref 150–400)
RBC: 2.14 MIL/uL — ABNORMAL LOW (ref 3.87–5.11)
RDW: 16.5 % — ABNORMAL HIGH (ref 11.5–15.5)
WBC: 9.6 10*3/uL (ref 4.0–10.5)

## 2018-06-01 LAB — POTASSIUM: Potassium: 3.8 mmol/L (ref 3.5–5.1)

## 2018-06-01 LAB — FOLATE RBC
FOLATE, HEMOLYSATE: 266.2 ng/mL
FOLATE, RBC: 1095 ng/mL (ref >498)
Hematocrit: 24.3 % — ABNORMAL LOW (ref 34.0–46.6)

## 2018-06-01 LAB — GLUCOSE, CAPILLARY
GLUCOSE-CAPILLARY: 177 mg/dL — AB (ref 70–99)
GLUCOSE-CAPILLARY: 177 mg/dL — AB (ref 70–99)
GLUCOSE-CAPILLARY: 181 mg/dL — AB (ref 70–99)
GLUCOSE-CAPILLARY: 188 mg/dL — AB (ref 70–99)
GLUCOSE-CAPILLARY: 219 mg/dL — AB (ref 70–99)
Glucose-Capillary: 175 mg/dL — ABNORMAL HIGH (ref 70–99)
Glucose-Capillary: 166 mg/dL — ABNORMAL HIGH (ref 70–99)
Glucose-Capillary: 175 mg/dL — ABNORMAL HIGH (ref 70–99)
Glucose-Capillary: 201 mg/dL — ABNORMAL HIGH (ref 70–99)
Glucose-Capillary: 201 mg/dL — ABNORMAL HIGH (ref 70–99)
Glucose-Capillary: 187 mg/dL — ABNORMAL HIGH (ref 70–99)

## 2018-06-01 LAB — PHOSPHORUS: PHOSPHORUS: 1.4 mg/dL — AB (ref 2.5–4.6)

## 2018-06-01 LAB — CALCIUM, IONIZED: CALCIUM, IONIZED, SERUM: 4.5 mg/dL (ref 4.5–5.6)

## 2018-06-01 LAB — MAGNESIUM: Magnesium: 1.6 mg/dL — ABNORMAL LOW (ref 1.7–2.4)

## 2018-06-01 LAB — PREPARE RBC (CROSSMATCH)

## 2018-06-01 LAB — LIPASE, BLOOD: LIPASE: 121 U/L — AB (ref 11–51)

## 2018-06-01 MED ORDER — MAGNESIUM SULFATE 50 % IJ SOLN
3.0000 g | Freq: Once | INTRAVENOUS | Status: AC
  Administered 2018-06-01: 3 g via INTRAVENOUS
  Filled 2018-06-01: qty 6

## 2018-06-01 MED ORDER — K PHOS MONO-SOD PHOS DI & MONO 155-852-130 MG PO TABS
500.0000 mg | ORAL_TABLET | Freq: Two times a day (BID) | ORAL | Status: DC
  Administered 2018-06-01 – 2018-06-03 (×5): 500 mg via ORAL
  Filled 2018-06-01 (×5): qty 2

## 2018-06-01 MED ORDER — SODIUM CHLORIDE 0.9% IV SOLUTION: Freq: Once | INTRAVENOUS | Status: DC

## 2018-06-01 MED ORDER — POTASSIUM CHLORIDE CRYS ER 20 MEQ PO TBCR
40.0000 meq | EXTENDED_RELEASE_TABLET | ORAL | Status: AC
  Administered 2018-06-01 (×2): 40 meq via ORAL
  Filled 2018-06-01 (×2): qty 2

## 2018-06-01 NOTE — Progress Notes (Signed)
Nutrition Follow-up  DOCUMENTATION CODES:   Severe malnutrition in context of chronic illness  INTERVENTION:   Tube Feeding:  Continue Vital AF 1.2 @ 60 ml/hr  Monitor diet tolerance/advancement  Recommend repletion of phosphorus, magnesium and potassium and checking at least daily until stable  Creon must be given by mouth; cannot be administered via Cortrak tube.    NUTRITION DIAGNOSIS:   Severe Malnutrition related to chronic illness(CHF, AAA s/p graft repair, pancreatitis) as evidenced by severe muscle depletion, moderate muscle depletion, mild fat depletion, moderate fat depletion, energy intake < or equal to 75% for > or equal to 1 month.  Being addressed via TF  GOAL:   Patient will meet greater than or equal to 90% of their needs  Met  MONITOR:   Diet advancement, Labs, TF tolerance, I & O's, Weight trends  REASON FOR ASSESSMENT:   Consult Enteral/tube feeding initiation and management  ASSESSMENT:   Patient with PMH significant for HTN, HLD, DM, COPD, GERD, gout, depression with anxiety, tobacco abuse, AAA s/p stent graft repair 01/2018, CAD, CABG, CHF, CKD IV s/p AV fistula for anticipated transition to HD, GI bleeding, mitral valve regurgitation, PAD, and s/p lap chole 04/19/18. Presents this admission with abdominal pain. Admitted for recurrent vs never resolved acute pancreatitis and GIB.    Pt on CL diet, ate some jello this AM. Pt complains of abdominal pain, reflux; pt has protonix ordered. No N/V Vital AF 1.2 @ 60 ml/hr via Cortrak tube  Hypokalemia persists, noted no magnesium checked since 9/23, no phosphorus checked this admission. RD ordered today. Corrected calcium for hypoalbuminemia is wdl. Ionized calcium 4.3 (L) Phosphorus at 1.4, magnesium 1.6, potassium 2.8. Discussed with MD Rai; MD placed orders for supplementation  Pt reports 6-7 loose stools yesterday; 2 thus far today  Current TF formula, Vital AF 1.2, is a peptide-based formula  designed to help manage inflammation, impaired GI function and GI intolerance. Do not recommend changing formula at this time  Creon started yesterday. Needs to be given by mouth. Should not be administered via Cortrak tube. Creon may assist with diarrhea Noted pt on zosyn which may also be contributing to diarrhea  Chronic anemia, Hgb 6.7 at present. Pt with CKD IV/V.    Labs: potassium 2.8, no phopshorus or magnesium, corrected calcium 9.2 (wdl), albumin 1.5, lipase 121 Meds: creon, remeron, ss novolog, LR at 50 ml/hr, cholecalciferol   Diet Order:   Diet Order            Diet clear liquid Room service appropriate? Yes; Fluid consistency: Thin  Diet effective now              EDUCATION NEEDS:   Not appropriate for education at this time  Skin:  Skin Assessment: Reviewed RN Assessment  Last BM:  9/26 loose stool  Height:   Ht Readings from Last 1 Encounters:  05/30/18 '5\' 5"'$  (1.651 m)    Weight:   Wt Readings from Last 1 Encounters:  05/30/18 56.6 kg    Ideal Body Weight:  56.8 kg  BMI:  Body mass index is 20.76 kg/m.  Estimated Nutritional Needs:   Kcal:  1700-1900 kcal  Protein:  90-105 grams  Fluid:  >/= 1.7 L/day   Kerman Passey MS, RD, LDN, CNSC [phone] Pager  [phone] Weekend/On-Call Pager

## 2018-06-01 NOTE — Progress Notes (Signed)
[facility]                                                                              Patient Demographics  [name], is a 67 y.o. female, DOB - 1951/05/31, TIR:[id number]  Admit date - 05/28/2018   Admitting Physician Ivor Costa, MD  Outpatient Primary MD for the patient is Bernerd Limbo, MD  Outpatient specialists:   LOS - 4  days   Medical records reviewed and are as summarized below:    Chief Complaint  Patient presents with  . Abdominal Pain  . Emesis       Brief summary   Patient is a 67 year old female with hypertension, hyperlipidemia, diabetesCOPD on home oxygen, GERD, gout, depression with anxiety, tobacco abuse, atrial fibrillation not on anticoagulants, AAA, CAD, CABG, CHF, CKD-4, GI bleeding, mitral valve regurgitation, PAD who presented with abdominal pain. Patient was recently hospitalized from 8/4-8/16 due to GI bleeding required blood transfusion and gallstone pancreatitis.  Patient underwent laparoscopic cholecystectomy.  She was discharged in stable condition.  Patient presented back with nausea vomiting abdominal pain, poor oral intake.    Assessment & Plan    Principal Problem:   Pancreatitis, recurrent with abdominal pain nausea and vomiting with pseudocyst -Status post cholecystectomy last month on 8/14 -GI following closely, CT abdomen pelvis showed worsening severe inflammatory changes surrounding the pancreatic head and neck, descending duodenum, hepatic flexure of colon.  New complex fluid collection inferior to the gallbladder fossa with layering hyperdense material, favor worsening pancreatitis with enlarging pancreatic pseudocyst. -Continue IV fluids, empiric Zosyn, pain control, feeding via core track  -Given continued abdominal pain, anemia, CT abdomen ordered to rule out any bleeding into the cirrhosis  Active Problems: Severe protein calorie malnutrition in the context of #1, chronic illness, diarrhea Nutrition  consult placed  Hypokalemia, hypomagnesemia, hypophosphatemia -Received potassium replacement this morning, recheck potassium -Also placed on K-Phos 500 mg twice daily, IV magnesium replacement  Chronic kidney disease stage IV - Not on HD, AVF placement in 10/2017, aVF stenosis followed by vascular outpatient -Creatinine function improving, follow closely  GI bleed with acute on chronic anemia, CKD  critical illness Hemoglobin baseline around 8 -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  Cardiology following recommended outpatient stress test, not a candidate for any invasive cardiac catheterization at this point -2D echo showed normal LV function, moderate mitral stenosis, mild MR -Patient has been followed by cardiology, follow I's and O's closely, hold diuretics secondary to renal insufficiency  History of AAA repair in 02/7618 complicated with retroperitoneal bleed  COPD, tobacco use -Currently no wheezing, continue nicotine patch  Code Status: full  DVT Prophylaxis:  SCD's Family Communication: Discussed in detail with the patient, all imaging results, lab results explained to the patient  Disposition Plan: Not medically ready  Time Spent in minutes  76mins   Procedures:  CT abdomen  2D echo  Consultants:   GI Cardiology  Antimicrobials:      Medications  Scheduled Meds: . sodium chloride   Intravenous Once  . allopurinol  100 mg Oral Daily  . ALPRAZolam  0.5 mg Oral QHS  . amLODipine  10 mg Oral Daily  . atorvastatin  40 mg Oral QPM  . carvedilol  25 mg Oral BID WC  . cholecalciferol  5,000 Units Oral Daily  . cloNIDine  0.2 mg Oral TID  . clopidogrel  75 mg Oral Q breakfast    . DULoxetine  60 mg Oral Daily  . famotidine  20 mg Oral QAC supper  . fenofibrate  160 mg Oral QHS  . gabapentin  300 mg Oral BID  . heparin  5,000 Units Subcutaneous Q8H  . hydrALAZINE  100 mg Oral TID  . insulin aspart  0-9 Units Subcutaneous Q4H  . isosorbide mononitrate  15 mg Oral Daily  . lipase/protease/amylase  12,000 Units Oral TID AC  . mirtazapine  7.5 mg Oral QHS  . mometasone-formoterol  2 puff Inhalation BID  . nicotine  21 mg Transdermal Daily  . pantoprazole sodium  40 mg Per Tube Daily  . phosphorus  500 mg Oral BID   Continuous Infusions: . sodium chloride 1,000 mL (05/31/18 0359)  . feeding supplement (VITAL AF 1.2 CAL) 1,500 mL (05/31/18 2214)  . lactated ringers 50 mL/hr at 05/31/18 2215  . magnesium sulfate LVP 250-500 ml    . piperacillin-tazobactam (ZOSYN)  IV 3.375 g (06/01/18 0539)   PRN Meds:.sodium chloride, acetaminophen **OR** acetaminophen, hydrALAZINE, HYDROcodone-acetaminophen, HYDROmorphone (DILAUDID) injection, hydrOXYzine, levalbuterol, ondansetron (ZOFRAN) IV, polyethylene glycol, zolpidem   Antibiotics   Anti-infectives (From admission, onward)   Start     Dose/Rate Route Frequency Ordered Stop   05/29/18 0600  piperacillin-tazobactam (ZOSYN) IVPB 3.375 g     3.375 g 12.5 mL/hr over 240 Minutes Intravenous Every 8 hours 05/28/18 2227     05/28/18 2230  piperacillin-tazobactam (ZOSYN) IVPB 3.375 g     3.375 g 100 mL/hr over 30 Minutes Intravenous  Once 05/28/18 2227 05/29/18 0011        Subjective:   Ailynn Gow was seen and examined today.  Weak and frail, complaining of right-sided abdominal pain, no fevers or chills.  No diarrhea today.  No chest pain.  Objective:   Vitals:   06/01/18 0550 06/01/18 0736 06/01/18 0916 06/01/18 1300  BP: (!) 143/39  (!) 142/49 (!) 134/38  Pulse: 65 64 64 67  Resp: 16 16 18 16   Temp: 97.6 F (36.4 C)  97.7 F (36.5 C) 97.7 F (36.5 C)  TempSrc:   Oral Oral  SpO2: 99% 98% 100% 100%   Weight:      Height:        Intake/Output Summary (Last 24 hours) at 06/01/2018 1334 Last data filed at 06/01/2018 0900 Gross per 24 hour  Intake 1939.88 ml  Output 0 ml  Net 1939.88 ml     Wt Readings from Last 3 Encounters:  05/30/18 56.6 kg  05/15/18 55.8 kg  04/28/18 60.3 kg     Exam General: Alert and oriented x 3, NAD, frail and ill-appearing Eyes:   HEENT:  Atraumatic, normocephalic Cardiovascular: S1 S2 auscultated, 2/6 systolic murmur at left sternal border, RRR Respiratory: Clear to auscultation bilaterally, no wheezing, rales or rhonchi Gastrointestinal: Soft, right-sided abdominal tenderness, positive BS  Ext: no pedal edema bilaterally Neuro: no new deficits Musculoskeletal: No digital cyanosis, clubbing Skin: No rashes  Psych: appears to be depressed mood, ill-appearing     Data Reviewed:  I have personally reviewed following labs and imaging studies  Micro Results Recent Results (from the past 240 hour(s))  Blood culture (routine x 2)     Status: None (Preliminary result)   Collection Time: 05/28/18  5:54 PM  Result Value Ref Range Status   Specimen Description BLOOD RIGHT ANTECUBITAL  Final   Special Requests   Final    BOTTLES DRAWN AEROBIC AND ANAEROBIC Blood Culture results may not be optimal due to an excessive volume of blood received in culture bottles   Culture   Final    NO GROWTH 4 DAYS Performed at [facility] [address], [place], [place] [address]    Report Status PENDING  Incomplete  Blood culture (routine x 2)     Status: None (Preliminary result)   Collection Time: 05/28/18  6:35 PM  Result Value Ref Range Status   Specimen Description BLOOD BLOOD RIGHT FOREARM  Final   Special Requests   Final    BOTTLES DRAWN AEROBIC ONLY Blood Culture results may not be optimal due to an inadequate volume of blood received in culture bottles   Culture   Final    NO GROWTH 4 DAYS Performed at [facility], [place] [address], [place], [place] [address]    Report Status PENDING  Incomplete  MRSA PCR Screening     Status: None   Collection Time: 05/30/18  2:53 PM  Result Value Ref Range Status   MRSA by PCR NEGATIVE NEGATIVE Final    Comment:        The GeneXpert MRSA Assay (FDA approved for NASAL specimens only), is one component of a comprehensive MRSA colonization surveillance program. It is not intended to diagnose MRSA infection nor to guide or monitor treatment for MRSA infections. Performed at [facility], [place] [address], [place], [place] [address]   C difficile quick scan w PCR reflex     Status: None   Collection Time: 05/31/18  9:11 AM  Result Value Ref Range Status   C Diff antigen NEGATIVE NEGATIVE Final   C Diff toxin NEGATIVE NEGATIVE Final   C Diff interpretation No C. difficile detected.  Final    Comment: Performed at [facility], [place] [address], [place], [place] [address]    Radiology Reports Ct Abdomen Pelvis Wo Contrast  Result Date: 05/28/2018 CLINICAL DATA:  New tiny right lung nodule on chest radiograph. Severe right lower quadrant abdominal pain with nausea, vomiting and diarrhea. Chronic kidney disease. Prior cholecystectomy 04/19/2018, hysterectomy and appendectomy. Prior bowel resection. EXAM: CT CHEST, ABDOMEN AND PELVIS WITHOUT CONTRAST TECHNIQUE: Multidetector CT imaging of the chest, abdomen and pelvis was performed following the standard protocol without IV contrast. COMPARISON:  Chest radiograph from earlier today. FINDINGS: CT CHEST FINDINGS Cardiovascular: Borderline mild cardiomegaly. No significant pericardial effusion/thickening. 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 Atherosclerotic nonaneurysmal thoracic aorta. Proximal left subclavian artery stent is noted. Dilated main pulmonary artery (4.0 cm diameter). Mediastinum/Nodes: No discrete thyroid nodules. Unremarkable esophagus. No axillary adenopathy. Mild right paratracheal  adenopathy up to 1.1 cm (series 3/image 21). Mildly enlarged 1.1 cm subcarinal node (series 3/image 26). No discrete hilar adenopathy on this noncontrast scan. Lungs/Pleura: No pneumothorax. No pleural effusion. A few tiny calcified granulomas are present in the upper lobes bilaterally. No acute consolidative airspace disease, lung masses or significant pulmonary nodules. Diffuse interlobular  septal thickening. Musculoskeletal: No aggressive appearing focal osseous lesions. Intact sternotomy wires. Moderate thoracic spondylosis. CT ABDOMEN PELVIS FINDINGS Hepatobiliary: Normal liver with no liver mass. Cholecystectomy. Bile ducts are within normal post cholecystectomy limits with CBD diameter 7 mm. Pancreas: Thickening of the pancreatic head and neck. Prominent peripancreatic fat stranding and ill-defined fluid in the pancreatic head and neck, worsened. Poorly defined 5.1 x 2.5 cm anterior pancreatic head complex fluid collection with heterogeneous internal density (series 3/image 66), increased from 2.3 x 1.2 cm. No definite pancreatic duct dilation. Severe wall thickening throughout the adjacent descending duodenum, worsened. Fluid collection inferior to the gallbladder fossa measures 5.0 x 3.0 cm (series 3/image 71) and demonstrates layering hyperdense material, new. Spleen: Normal size. No mass. Adrenals/Urinary Tract: Normal adrenals. No hydronephrosis. No renal stones. Partially exophytic simple 1.3 cm posterior interpolar left renal cyst. No additional contour deforming renal lesions. Normal bladder. Stomach/Bowel: Normal non-distended stomach. Normal caliber small bowel with no small bowel wall thickening. Appendectomy. New segmental wall thickening in the hepatic flexure of the colon. No additional sites of large bowel wall thickening. Stable postsurgical changes from distal large bowel resection. Vascular/Lymphatic: Atherosclerotic abdominal aorta with stable 5.0 cm infrarenal abdominal aortic aneurysm  status post aorto bi-iliac stent graft repair. Surgical clips are noted in the right inguinal region. No pathologically enlarged lymph nodes in the abdomen or pelvis. Reproductive: Status post hysterectomy, with no abnormal findings at the vaginal cuff. No adnexal mass. Other: No pneumoperitoneum.  New small volume perihepatic ascites. Musculoskeletal: No aggressive appearing focal osseous lesions. Mild lumbar spondylosis. IMPRESSION: CT CHEST: 1. No lung masses or significant pulmonary nodules. 2. Borderline mild cardiomegaly. Diffuse interlobular septal thickening suggests mild pulmonary edema. No pleural effusions. 3. Dilated main pulmonary artery, suggesting pulmonary arterial hypertension. CT ABDOMEN PELVIS: 1. Worsening severe inflammatory changes surrounding the pancreatic head and neck, descending duodenum and hepatic flexure of the colon. Enlarging nonspecific complex poorly defined fluid collection in the anterior pancreatic head. New complex fluid collection inferior to the gallbladder fossa with layering hyperdense material. Burtis Junes worsening pancreatitis with enlarging pancreatic pseudocysts. Marked wall thickening in the descending duodenum has worsened, and could be reactive, with peptic ulcer disease difficult to exclude. No free air. Short-term follow-up MRI or CT abdomen with IV contrast suggested. 2. New wall thickening throughout the hepatic flexure of the colon, probably reactive. 3. Cholecystectomy. No intrahepatic biliary ductal dilatation. CBD diameter 7 mm, within normal post cholecystectomy limits. 4. New small volume perihepatic ascites. 5.  Aortic Atherosclerosis (ICD10-I70.0). Electronically Signed   By: Ilona Sorrel M.D.   On: 05/28/2018 20:41   Ct Abdomen Pelvis Wo Contrast  Result Date: 05/16/2018 CLINICAL DATA:  Acute abdominal pain.  Cholecystectomy last month. EXAM: CT ABDOMEN AND PELVIS WITHOUT CONTRAST TECHNIQUE: Multidetector CT imaging of the abdomen and pelvis was performed  following the standard protocol without IV contrast. COMPARISON:  Multiple prior exams most recent CT 04/18/2018 FINDINGS: Lower chest: Improved pleural effusions with small right greater than left residual. Mild septal thickening at the lung bases. Coronary artery calcifications. Mild cardiomegaly. Hepatobiliary: No focal hepatic lesion on noncontrast exam. Postcholecystectomy with clips in the gallbladder fossa. Small hyperdensity in the gallbladder fossa may be cystic duct remnant with small stone. No biliary dilatation. Pancreas: Similar appearance of peripancreatic fat stranding with stranding involving the region of the duodenum. Previously seen fluid collections in the pancreatic head are not definitively visualized, lack of IV contrast limits detailed assessment. No evidence of progressive fluid collection. No ductal  dilatation. Spleen: Normal in size. Adrenals/Urinary Tract: Mild left adrenal thickening without dominant nodule. Right adrenal gland is normal. Calcifications of both renal hila may be vascular or nonobstructing stones, grossly unchanged from prior exam. No hydronephrosis. Small low-density lesion arising from the left kidney, likely cysts. Urinary bladder is nondistended and not well evaluated. Stomach/Bowel: Again seen wall thickening about the distal stomach and duodenum, not well assessed in the absence of contrast. Degree of stranding has slightly increased from prior exam. No other small or large bowel inflammation or wall thickening. Enteric sutures in the sigmoid colon. Prior appendectomy. Vascular/Lymphatic: Aorto bi-iliac stent graft. Residual aneurysmal diameter of 5.1 cm, unchanged. Stent at the origin of both renal arteries. Aortic and peripheral atherosclerosis. Diffuse atherosclerosis of external iliac arteries. Reproductive: Status post hysterectomy. No adnexal masses. Other: Fluid collection in the left retroperitoneum inferior to the left kidney and lateral to the psoas muscle  currently measures 4.5 x 3.4 cm, previously 5.5 x 4 cm. No internal air. Upper abdominal stranding and edema. No frank ascites. No free air. Musculoskeletal: There are no acute or suspicious osseous abnormalities. IMPRESSION: 1. Persistent fat stranding and poorly defined fat planes in the region of the pancreatic head and duodenum. Favor persistent or recurrent pancreatitis over duodenitis. Previous small fluid collections in the region of the pancreatic head are not currently visualized and may have resolved, lack of contrast limits assessment. If the patient can tolerate breath hold technique, pancreatic protocol MRI may be of value to further assess. 2. Interval cholecystectomy. Small hyperdensity in the gallbladder fossa may be gallstone within the cystic duct remnant, no evidence of inflammatory change. 3. Decreasing size of left retroperitoneal hematoma. 4. Decreased bilateral pleural effusions from prior exam with mild residual. Septal thickening in the lung bases suspicious for pulmonary edema. 5. Aorto bi-iliac stent graft repair of abdominal aortic aneurysm with unchanged residual aortic diameter. Diffuse aortic and branch atherosclerosis. Aortic Atherosclerosis (ICD10-I70.0). 6. Electronically Signed   By: Keith Rake M.D.   On: 05/16/2018 07:07   Ct Chest Wo Contrast  Result Date: 05/28/2018 CLINICAL DATA:  New tiny right lung nodule on chest radiograph. Severe right lower quadrant abdominal pain with nausea, vomiting and diarrhea. Chronic kidney disease. Prior cholecystectomy 04/19/2018, hysterectomy and appendectomy. Prior bowel resection. EXAM: CT CHEST, ABDOMEN AND PELVIS WITHOUT CONTRAST TECHNIQUE: Multidetector CT imaging of the chest, abdomen and pelvis was performed following the standard protocol without IV contrast. COMPARISON:  Chest radiograph from earlier today. FINDINGS: CT CHEST FINDINGS Cardiovascular: Borderline mild cardiomegaly. No significant pericardial effusion/thickening.  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 Atherosclerotic nonaneurysmal thoracic aorta. Proximal left subclavian artery stent is noted. Dilated main pulmonary artery (4.0 cm diameter). Mediastinum/Nodes: No discrete thyroid nodules. Unremarkable esophagus. No axillary adenopathy. Mild right paratracheal adenopathy up to 1.1 cm (series 3/image 21). Mildly enlarged 1.1 cm subcarinal node (series 3/image 26). No discrete hilar adenopathy on this noncontrast scan. Lungs/Pleura: No pneumothorax. No pleural effusion. A few tiny calcified granulomas are present in the upper lobes bilaterally. No acute consolidative airspace disease, lung masses or significant pulmonary nodules. Diffuse interlobular septal thickening. Musculoskeletal: No aggressive appearing focal osseous lesions. Intact sternotomy wires. Moderate thoracic spondylosis. CT ABDOMEN PELVIS FINDINGS Hepatobiliary: Normal liver with no liver mass. Cholecystectomy. Bile ducts are within normal post cholecystectomy limits with CBD diameter 7 mm. Pancreas: Thickening of the pancreatic head and neck. Prominent peripancreatic fat stranding and ill-defined fluid in the pancreatic head and neck, worsened. Poorly defined 5.1  x 2.5 cm anterior pancreatic head complex fluid collection with heterogeneous internal density (series 3/image 66), increased from 2.3 x 1.2 cm. No definite pancreatic duct dilation. Severe wall thickening throughout the adjacent descending duodenum, worsened. Fluid collection inferior to the gallbladder fossa measures 5.0 x 3.0 cm (series 3/image 71) and demonstrates layering hyperdense material, new. Spleen: Normal size. No mass. Adrenals/Urinary Tract: Normal adrenals. No hydronephrosis. No renal stones. Partially exophytic simple 1.3 cm posterior interpolar left renal cyst. No additional contour deforming renal lesions. Normal bladder. Stomach/Bowel: Normal non-distended stomach. Normal caliber small bowel with no small  bowel wall thickening. Appendectomy. New segmental wall thickening in the hepatic flexure of the colon. No additional sites of large bowel wall thickening. Stable postsurgical changes from distal large bowel resection. Vascular/Lymphatic: Atherosclerotic abdominal aorta with stable 5.0 cm infrarenal abdominal aortic aneurysm status post aorto bi-iliac stent graft repair. Surgical clips are noted in the right inguinal region. No pathologically enlarged lymph nodes in the abdomen or pelvis. Reproductive: Status post hysterectomy, with no abnormal findings at the vaginal cuff. No adnexal mass. Other: No pneumoperitoneum.  New small volume perihepatic ascites. Musculoskeletal: No aggressive appearing focal osseous lesions. Mild lumbar spondylosis. IMPRESSION: CT CHEST: 1. No lung masses or significant pulmonary nodules. 2. Borderline mild cardiomegaly. Diffuse interlobular septal thickening suggests mild pulmonary edema. No pleural effusions. 3. Dilated main pulmonary artery, suggesting pulmonary arterial hypertension. CT ABDOMEN PELVIS: 1. Worsening severe inflammatory changes surrounding the pancreatic head and neck, descending duodenum and hepatic flexure of the colon. Enlarging nonspecific complex poorly defined fluid collection in the anterior pancreatic head. New complex fluid collection inferior to the gallbladder fossa with layering hyperdense material. Burtis Junes worsening pancreatitis with enlarging pancreatic pseudocysts. Marked wall thickening in the descending duodenum has worsened, and could be reactive, with peptic ulcer disease difficult to exclude. No free air. Short-term follow-up MRI or CT abdomen with IV contrast suggested. 2. New wall thickening throughout the hepatic flexure of the colon, probably reactive. 3. Cholecystectomy. No intrahepatic biliary ductal dilatation. CBD diameter 7 mm, within normal post cholecystectomy limits. 4. New small volume perihepatic ascites. 5.  Aortic Atherosclerosis  (ICD10-I70.0). Electronically Signed   By: Ilona Sorrel M.D.   On: 05/28/2018 20:41   Dg Chest Portable 1 View  Result Date: 05/28/2018 CLINICAL DATA:  Severe right lower quadrant pain. EXAM: PORTABLE CHEST 1 VIEW COMPARISON:  04/18/2018 FINDINGS: The lungs are clear without focal pneumonia, edema, pneumothorax or pleural effusion. Interstitial markings are diffusely coarsened with chronic features. Tiny nodular density identified right upper lung, superimposed on anterior right second rib. Patient is status post median sternotomy. Left atrial appendage occluded device noted. Vascular stent superimposed on the transverse aorta. The visualized bony structures of the thorax are intact. IMPRESSION: Cardiomegaly with mild vascular congestion or chronic interstitial coarsening. Tiny nodule right upper lung not visible on prior studies and indeterminate. CT chest without contrast could be used to further evaluate. Electronically Signed   By: Misty Stanley M.D.   On: 05/28/2018 17:23   Dg Abd 2 Views  Result Date: 05/30/2018 CLINICAL DATA:  History of pancreatitis.  Abdominal pain. EXAM: ABDOMEN - 2 VIEW COMPARISON:  Chest radiograph 05/29/2018 FINDINGS: Feeding tube tip projects at the gastric antrum/proximal duodenum, unchanged. Aortic stent graft material. Monitoring leads overlie the patient. Gas is demonstrated within nondilated loops of large and small bowel. Small left pleural effusion. Heterogeneous opacities left lung base. IMPRESSION: Feeding tube projects at the gastric antrum/proximal duodenum, unchanged. Decreased gaseous distention of the  stomach and small bowel. Electronically Signed   By: Lovey Newcomer M.D.   On: 05/30/2018 10:49   Dg Abd Portable 1v  Result Date: 05/29/2018 CLINICAL DATA:  Feeding tube placement EXAM: PORTABLE ABDOMEN - 1 VIEW COMPARISON:  None. FINDINGS: Gaseous distension of the stomach and proximal small bowel. Feeding tube with the tip projecting over antrum versus proximal  duodenum. No evidence of pneumoperitoneum, portal venous gas or pneumatosis. No pathologic calcifications along the expected course of the ureters. Aorto bi-iliac stent graft is noted. Left renal artery stent is noted. Osseous structures are unremarkable. IMPRESSION: Gaseous distension of the stomach and proximal small bowel. Feeding tube with the tip projecting over antrum versus proximal duodenum. Electronically Signed   By: Kathreen Devoid   On: 05/29/2018 15:30    Lab Data:  CBC: Recent Labs  Lab 05/29/18 0335 05/29/18 1528 05/30/18 0236 05/31/18 0522 06/01/18 0450  WBC 14.5* 14.1* 14.4* 8.2 9.6  NEUTROABS  --  12.9*  --   --   --   HGB 8.4* 8.2* 8.4* 7.7* 6.7*  HCT 26.6* 26.7* 27.1* 24.7* 21.0*  MCV 98.2 100.4* 100.0 98.8 98.1  PLT 215 218 220 207 268   Basic Metabolic Panel: Recent Labs  Lab 05/29/18 0335 05/29/18 1528 05/30/18 0236 05/31/18 0522 06/01/18 0450  NA 142 141 140 140 137  K 3.0* 3.7 4.1 3.1* 2.8*  CL 101 100 100 99 99  CO2 28 25 24 30 29   GLUCOSE 102* 85 149* 173* 184*  BUN 31* 32* 35* 34* 33*  CREATININE 1.91* 1.98* 2.27* 1.94* 1.76*  CALCIUM 7.4* 7.8* 7.7* 7.8* 7.2*  MG 1.3* 2.1 2.1  --  1.6*  PHOS  --   --   --   --  1.4*   GFR: Estimated Creatinine Clearance: 28.5 mL/min (A) (by C-G formula based on SCr of 1.76 mg/dL (H)). Liver Function Tests: Recent Labs  Lab 05/28/18 1507 05/29/18 1528 05/30/18 0236 05/31/18 0522 06/01/18 0450  AST 18 20 17 15  11*  ALT 8 7 8 7 5   ALKPHOS 48 36* 36* 34* 36*  BILITOT 1.7* 1.4* 1.8* 0.8 0.5  PROT 5.2* 4.5* 4.9* 4.8* 4.4*  ALBUMIN 2.4* 1.9* 1.9* 1.7* 1.5*   Recent Labs  Lab 05/28/18 1507 05/29/18 0335 05/30/18 0236 05/31/18 0522 06/01/18 0450  LIPASE 399* 667* 270* 162* 121*   No results for input(s): AMMONIA in the last 168 hours. Coagulation Profile: Recent Labs  Lab 05/28/18 2325  INR 1.13   Cardiac Enzymes: Recent Labs  Lab 05/29/18 0335 05/29/18 0954 05/29/18 1528 05/29/18 2013  05/30/18 0236  TROPONINI 0.35* 0.52* 0.44* 0.34* 0.31*   BNP (last 3 results) No results for input(s): PROBNP in the last 8760 hours. HbA1C: No results for input(s): HGBA1C in the last 72 hours. CBG: Recent Labs  Lab 06/01/18 0316 06/01/18 0649 06/01/18 0732 06/01/18 1034 06/01/18 1141  GLUCAP 188* 166* 175* 201* 181*   Lipid Profile: No results for input(s): CHOL, HDL, LDLCALC, TRIG, CHOLHDL, LDLDIRECT in the last 72 hours. Thyroid Function Tests: No results for input(s): TSH, T4TOTAL, FREET4, T3FREE, THYROIDAB in the last 72 hours. Anemia Panel: Recent Labs    05/31/18 0522  VITAMINB12 307   Urine analysis:    Component Value Date/Time   COLORURINE YELLOW 05/28/2018 0506   APPEARANCEUR HAZY (A) 05/28/2018 0506   LABSPEC 1.021 05/28/2018 0506   PHURINE 5.0 05/28/2018 0506   GLUCOSEU NEGATIVE 05/28/2018 0506   HGBUR NEGATIVE 05/28/2018 0506  BILIRUBINUR NEGATIVE 05/28/2018 0506   KETONESUR 5 (A) 05/28/2018 0506   PROTEINUR NEGATIVE 05/28/2018 0506   UROBILINOGEN 0.2 03/04/2011 2107   NITRITE NEGATIVE 05/28/2018 0506   LEUKOCYTESUR SMALL (A) 05/28/2018 0506     Kajuana Shareef M.D. [facility] 06/01/2018, 1:34 PM  Pager: [phone] Between 7am to 7pm - call Pager - [phone]  After 7pm go to [email or web address] - password [email or web address]  Call night coverage person covering after 7pm

## 2018-06-01 NOTE — Progress Notes (Signed)
CRITICAL VALUE ALERT  Critical Value: hgb 6.7  Date & Time Notied:  9/26 5:20am  Provider Notified: NP Baltazar Najjar  Orders Received/Actions taken: awaiting orders

## 2018-06-01 NOTE — Progress Notes (Addendum)
[facility] Progress Note   Chief Complaint:   pancreatitis  SUBJECTIVE:    feels weak. Continues to have constant right sided abdominal pain, worse with movement.    ASSESSMENT AND PLAN:    1. 67 yo female with recurrent vs unresolved pancreatitis (from episode in mid August). CT scan 4 days ago >> worsening peripancreatic inflammation new complex fluid collections favored to be enlarging pseudocysts. Pancreatitis was presumably gallstone related but still looking for other etiologies. She hasn't had imaging with contrast due to renal function  -TF in progress, rate is at goal -s/p lap mid Aug for presumed biliary pancreatitis -IgG4 pending  2. Loose stool, C-diff negative. On Zosyn (non C-diff antibiotic associated diarrhea), enteral feeding related? No BMs today  3. Recurrent acute on chronic anemia, heme positive x 3. Prior to this admission her hgb 9.1 on 05/15/18.  Initial hgb this admission was 11 but probably hemoconcentrated. Apart from that her hgb has declined steadily from 8.4 >> 7.7 >> to 6.7 today. EGD las admission [facility] Aug) for anemia was revealing.  -some of the slow decline in hgb could be diluation.  -? Bleeding into pseudocyst vrs GI bleed. No gross blood, just Heme positive. Severe wall thickening or descending duodenum on CT scan but likely this is reactive. There was also segmental wall thickening in hepatic flexure area on CT scan but if colitis but expect some overt bleeding. LiIkely reactive -Will likely schedule repeat CT scan today, would be ideal to get with contrast though I know renal function is an issue.  -she is scheduled for colonoscopy with [place] late October.  -borderline macrocytic. B12 normal. Folate still pending -Primary team has ordered transfusion  ADDENDUM:   I called patient's Nephrologist, Dr. Posey Pronto. Her GFR is 29, at this point IV contrast is not recommended. Even with hydration it would be risky.   4. AKI on CKD. Cr  improving, at 1.76 today.   5. CAD / DES 2016 on plavix / diastolic chf. Of note she is positive 4 liters fluid balance this admit  OBJECTIVE:     Vital signs in last 24 hours: Temp:  [97.6 F (36.4 C)-98.6 F (37 C)] 97.7 F (36.5 C) (09/26 0916) Pulse Rate:  [64-66] 64 (09/26 0916) Resp:  [16-18] 18 (09/26 0916) BP: (135-145)/(38-49) 142/49 (09/26 0916) SpO2:  [95 %-100 %] 100 % (09/26 0916) FiO2 (%):  [28 %] 28 % (09/26 0736) Last BM Date: 05/31/18 General:   Alert, female in NAD EENT:  Normal hearing, non icteric sclera, conjunctive pink.  Heart:  Regular rate and rhythm; + murmurs. Trace pedal edema.  Pulm: Normal respiratory effort, lungs CTA bilaterally without wheezes or crackles. Abdomen:  Soft, nondistended, moderate RUQ tenderness. Normal bowel sounds, no masses felt.     Neurologic:  Alert and  oriented x4;  grossly normal neurologically. Psych:  Pleasant, cooperative.  Normal mood and affect.   Intake/Output from previous day: 09/25 0701 - 09/26 0700 In: 2059.9 [P.O.:460; I.V.:1069.9; NG/GT:480; IV Piggyback:50] Out: 450 [Stool:450] Intake/Output this shift: No intake/output data recorded.  Lab Results: Recent Labs    05/30/18 0236 05/31/18 0522 06/01/18 0450  WBC 14.4* 8.2 9.6  HGB 8.4* 7.7* 6.7*  HCT 27.1* 24.7* 21.0*  PLT 220 207 178   BMET Recent Labs    05/30/18 0236 05/31/18 0522 06/01/18 0450  NA 140 140 137  K 4.1 3.1* 2.8*  CL 100 99 99  CO2 24 30 29   GLUCOSE 149*  173* 184*  BUN 35* 34* 33*  CREATININE 2.27* 1.94* 1.76*  CALCIUM 7.7* 7.8* 7.2*   LFT     Principal Problem:   Pancreatitis, recurrent Active Problems:   TOBACCO ABUSE   Essential hypertension   Atrial fibrillation (HCC)   COPD (chronic obstructive pulmonary disease) (HCC)   S/P CABG x 3   Anemia   GERD (gastroesophageal reflux disease)   Hyperlipidemia   CAD (coronary artery disease)   Chronic kidney disease (CKD), stage IV (severe) (HCC)   Chronic  diastolic congestive heart failure (HCC)   Type II diabetes mellitus with renal manifestations (HCC)   Elevated troponin   Hypertensive urgency   Hypokalemia   SIRS (systemic inflammatory response syndrome) (HCC)   Protein-calorie malnutrition, severe     LOS: 4 days   Tye Savoy ,NP 06/01/2018, 9:34 AM

## 2018-06-02 DIAGNOSIS — K219 Gastro-esophageal reflux disease without esophagitis: Secondary | ICD-10-CM

## 2018-06-02 DIAGNOSIS — K862 Cyst of pancreas: Secondary | ICD-10-CM

## 2018-06-02 LAB — TYPE AND SCREEN
ABO/RH(D): B POS
ANTIBODY SCREEN: NEGATIVE
UNIT DIVISION: 0

## 2018-06-02 LAB — CULTURE, BLOOD (ROUTINE X 2)
CULTURE: NO GROWTH
Culture: NO GROWTH

## 2018-06-02 LAB — COMPREHENSIVE METABOLIC PANEL
ALBUMIN: 1.7 g/dL — AB (ref 3.5–5.0)
ALK PHOS: 41 U/L (ref 38–126)
ALT: 6 U/L (ref 0–44)
AST: 16 U/L (ref 15–41)
Anion gap: 10 (ref 5–15)
BUN: 36 mg/dL — AB (ref 8–23)
CALCIUM: 7.7 mg/dL — AB (ref 8.9–10.3)
CO2: 28 mmol/L (ref 22–32)
Chloride: 99 mmol/L (ref 98–111)
Creatinine, Ser: 1.66 mg/dL — ABNORMAL HIGH (ref 0.44–1.00)
GFR calc Af Amer: 36 mL/min — ABNORMAL LOW (ref >60)
GFR calc non Af Amer: 31 mL/min — ABNORMAL LOW (ref >60)
GLUCOSE: 119 mg/dL — AB (ref 70–99)
Potassium: 4.3 mmol/L (ref 3.5–5.1)
Sodium: 137 mmol/L (ref 135–145)
TOTAL PROTEIN: 4.8 g/dL — AB (ref 6.5–8.1)
Total Bilirubin: 0.6 mg/dL (ref 0.3–1.2)

## 2018-06-02 LAB — BPAM RBC
Blood Product Expiration Date: 201910262359
ISSUE DATE / TIME: 201909261311
UNIT TYPE AND RH: 7300

## 2018-06-02 LAB — GLUCOSE, CAPILLARY
GLUCOSE-CAPILLARY: 162 mg/dL — AB (ref 70–99)
GLUCOSE-CAPILLARY: 114 mg/dL — AB (ref 70–99)
GLUCOSE-CAPILLARY: 178 mg/dL — AB (ref 70–99)
Glucose-Capillary: 153 mg/dL — ABNORMAL HIGH (ref 70–99)
Glucose-Capillary: 154 mg/dL — ABNORMAL HIGH (ref 70–99)
Glucose-Capillary: 157 mg/dL — ABNORMAL HIGH (ref 70–99)
Glucose-Capillary: 162 mg/dL — ABNORMAL HIGH (ref 70–99)
Glucose-Capillary: 164 mg/dL — ABNORMAL HIGH (ref 70–99)
Glucose-Capillary: 165 mg/dL — ABNORMAL HIGH (ref 70–99)
Glucose-Capillary: 188 mg/dL — ABNORMAL HIGH (ref 70–99)

## 2018-06-02 LAB — CBC
HCT: 27.6 % — ABNORMAL LOW (ref 36.0–46.0)
HEMOGLOBIN: 8.8 g/dL — AB (ref 12.0–15.0)
MCH: 30.8 pg (ref 26.0–34.0)
MCHC: 31.9 g/dL (ref 30.0–36.0)
MCV: 96.5 fL (ref 78.0–100.0)
PLATELETS: 202 10*3/uL (ref 150–400)
RBC: 2.86 MIL/uL — ABNORMAL LOW (ref 3.87–5.11)
RDW: 17.1 % — ABNORMAL HIGH (ref 11.5–15.5)
WBC: 12.7 10*3/uL — ABNORMAL HIGH (ref 4.0–10.5)

## 2018-06-02 LAB — MAGNESIUM: Magnesium: 2.4 mg/dL (ref 1.7–2.4)

## 2018-06-02 LAB — PHOSPHORUS: PHOSPHORUS: 2.7 mg/dL (ref 2.5–4.6)

## 2018-06-02 LAB — LIPASE, BLOOD: Lipase: 106 U/L — ABNORMAL HIGH (ref 11–51)

## 2018-06-02 MED ORDER — LOPERAMIDE HCL 2 MG PO CAPS
2.0000 mg | ORAL_CAPSULE | Freq: Three times a day (TID) | ORAL | Status: DC | PRN
  Administered 2018-06-02: 2 mg via ORAL
  Filled 2018-06-02: qty 1

## 2018-06-02 MED ORDER — OSMOLITE 1.2 CAL PO LIQD
1000.0000 mL | ORAL | Status: DC
  Administered 2018-06-02 – 2018-06-06 (×5): 1000 mL
  Filled 2018-06-02 (×8): qty 1000

## 2018-06-02 MED ORDER — ALUM & MAG HYDROXIDE-SIMETH 200-200-20 MG/5ML PO SUSP
30.0000 mL | Freq: Four times a day (QID) | ORAL | Status: DC | PRN
  Administered 2018-06-02: 30 mL via ORAL
  Filled 2018-06-02: qty 30

## 2018-06-02 NOTE — Progress Notes (Signed)
[facility]                                                                              Patient Demographics  [name], is a 67 y.o. female, DOB - 03/02/1951, OIB:[id number]  Admit date - 05/28/2018   Admitting Physician Ivor Costa, MD  Outpatient Primary MD for the patient is Bernerd Limbo, MD  Outpatient specialists:   LOS - 5  days   Medical records reviewed and are as summarized below:    Chief Complaint  Patient presents with  . Abdominal Pain  . Emesis       Brief summary   Patient is a 67 year old female with hypertension, hyperlipidemia, diabetesCOPD on home oxygen, GERD, gout, depression with anxiety, tobacco abuse, atrial fibrillation not on anticoagulants, AAA, CAD, CABG, CHF, CKD-4, GI bleeding, mitral valve regurgitation, PAD who presented with abdominal pain. Patient was recently hospitalized from 8/4-8/16 due to GI bleeding required blood transfusion and gallstone pancreatitis.  Patient underwent laparoscopic cholecystectomy.  She was discharged in stable condition.  Patient presented back with nausea vomiting abdominal pain, poor oral intake.    Assessment & Plan    Principal Problem:   Pancreatitis, recurrent with abdominal pain nausea and vomiting with pseudocyst -Status post cholecystectomy last month on 8/14 -GI following closely, CT abdomen pelvis showed worsening severe inflammatory changes surrounding the pancreatic head and neck, descending duodenum, hepatic flexure of colon.  New complex fluid collection inferior to the gallbladder fossa with layering hyperdense material, favor worsening pancreatitis with enlarging pancreatic pseudocyst. -Continue IV fluids, empiric Zosyn, pain control, feeding via core track  -GI following closely, ideally patient needs CT abdomen with contrast however high risk of contrast nephropathy with stage IV CKD -Out of bed to chair, improve ambulation  Active Problems: Severe protein calorie  malnutrition in the context of #1, chronic illness, diarrhea Nutrition consult placed if we can change to different tube feed due to continuous diarrhea  Hypokalemia, hypomagnesemia, hypophosphatemia - placed on K-Phos 500 mg twice daily -Potassium, magnesium improved, phosphorus improving  Chronic kidney disease stage IV - Not on HD, AVF placement in 10/2017, aVF stenosis followed by vascular outpatient -Creatinine function currently stable  GI bleed with acute on chronic anemia, CKD  critical illness Hemoglobin baseline around 8 -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  Cardiology following recommended outpatient stress test, not a candidate for any invasive cardiac catheterization at this point -2D echo showed normal LV function, moderate mitral stenosis, mild MR -Patient has been followed by cardiology, follow I's and O's closely, hold diuretics secondary to renal insufficiency  History of AAA repair in 05/4502 complicated with retroperitoneal bleed  COPD, tobacco use -Currently no wheezing, continue nicotine patch  Code Status: full  DVT Prophylaxis:  SCD's Family Communication: Discussed in detail with the patient, all imaging results, lab results explained to the patient and  daughter, [name]  Disposition Plan: Not medically ready  Time Spent in minutes  15mins   Procedures:  CT abdomen 2D echo  Consultants:   GI Cardiology  Antimicrobials:      Medications  Scheduled Meds: . sodium chloride   Intravenous Once  . allopurinol  100 mg Oral Daily  . ALPRAZolam  0.5 mg Oral QHS  . amLODipine  10 mg Oral Daily  . atorvastatin  40 mg Oral QPM  . carvedilol  25 mg Oral BID  WC  . cholecalciferol  5,000 Units Oral Daily  . cloNIDine  0.2 mg Oral TID  . clopidogrel  75 mg Oral Q breakfast  . DULoxetine  60 mg Oral Daily  . famotidine  20 mg Oral QAC supper  . fenofibrate  160 mg Oral QHS  . gabapentin  300 mg Oral BID  . heparin  5,000 Units Subcutaneous Q8H  . hydrALAZINE  100 mg Oral TID  . insulin aspart  0-9 Units Subcutaneous Q4H  . isosorbide mononitrate  15 mg Oral Daily  . lipase/protease/amylase  12,000 Units Oral TID AC  . mirtazapine  7.5 mg Oral QHS  . mometasone-formoterol  2 puff Inhalation BID  . nicotine  21 mg Transdermal Daily  . pantoprazole sodium  40 mg Per Tube Daily  . phosphorus  500 mg Oral BID   Continuous Infusions: . sodium chloride 250 mL (06/01/18 2343)  . feeding supplement (VITAL AF 1.2 CAL) 1,500 mL (06/02/18 0607)  . lactated ringers 50 mL/hr at 06/01/18 1647  . piperacillin-tazobactam (ZOSYN)  IV 3.375 g (06/02/18 0854)   PRN Meds:.sodium chloride, acetaminophen **OR** acetaminophen, hydrALAZINE, HYDROcodone-acetaminophen, HYDROmorphone (DILAUDID) injection, hydrOXYzine, levalbuterol, loperamide, ondansetron (ZOFRAN) IV, polyethylene glycol, zolpidem   Antibiotics   Anti-infectives (From admission, onward)   Start     Dose/Rate Route Frequency Ordered Stop   05/29/18 0600  piperacillin-tazobactam (ZOSYN) IVPB 3.375 g     3.375 g 12.5 mL/hr over 240 Minutes Intravenous Every 8 hours 05/28/18 2227     05/28/18 2230  piperacillin-tazobactam (ZOSYN) IVPB 3.375 g     3.375 g 100 mL/hr over 30 Minutes Intravenous  Once 05/28/18 2227 05/29/18 0011        Subjective:   [name] was seen and examined today.  Very weak and frail, overnight had diarrhea.  No fevers or chills.  Right-sided abdominal pain.  No chest pain or shortness of breath.    Objective:   Vitals:   06/01/18 2049 06/02/18 0649 06/02/18 0819 06/02/18 1006  BP: (!) 136/50 (!) 127/45  (!) 140/53  Pulse: 64 64  68  Resp: 18 18  16   Temp: 98.1  F (36.7 C) 97.7 F (36.5 C)  97.7 F (36.5 C)  TempSrc: Oral   Oral  SpO2: 98% 91% 92% 93%  Weight:      Height:        Intake/Output Summary (Last 24 hours) at 06/02/2018 1240 Last data filed at 06/02/2018 1132 Gross per 24 hour  Intake 3139.72 ml  Output 401 ml  Net 2738.72 ml     Wt Readings from Last 3 Encounters:  05/30/18 56.6 kg  05/15/18 55.8 kg  04/28/18 60.3 kg     Exam   General: Alert and oriented x 3, weak and ill-appearing  Eyes:   HEENT:  +cortrak  Cardiovascular: S1 S2 auscultated,  Regular rate and rhythm. No pedal edema b/l  Respiratory: Clear to auscultation bilaterally, no wheezing, rales or rhonchi  Gastrointestinal: Soft,  mild right-sided tenderness, nondistended, + bowel sounds  Ext: no pedal edema bilaterally  Neuro: no new FND's  Musculoskeletal: No digital cyanosis, clubbing  Skin: No rashes  Psych: Normal affect and demeanor, alert and oriented x3     Data Reviewed:  I have personally reviewed following labs and imaging studies  Micro Results Recent Results (from the past 240 hour(s))  Blood culture (routine x 2)     Status: None   Collection Time: 05/28/18  5:54 PM  Result Value Ref Range Status   Specimen Description BLOOD RIGHT ANTECUBITAL  Final   Special Requests   Final    BOTTLES DRAWN AEROBIC AND ANAEROBIC Blood Culture results may not be optimal due to an excessive volume of blood received in culture bottles   Culture   Final    NO GROWTH 5 DAYS Performed at [facility], [place] [address] [place], [place] [address]    Report Status 06/02/2018 FINAL  Final  Blood culture (routine x 2)     Status: None   Collection Time: 05/28/18  6:35 PM  Result Value Ref Range Status   Specimen Description BLOOD BLOOD RIGHT FOREARM  Final   Special Requests   Final    BOTTLES DRAWN AEROBIC ONLY Blood Culture results may not be optimal due to an inadequate volume of blood received in culture bottles   Culture   Final     NO GROWTH 5 DAYS Performed at [facility], [place] [address], [place], [place] [address]    Report Status 06/02/2018 FINAL  Final  MRSA PCR Screening     Status: None   Collection Time: 05/30/18  2:53 PM  Result Value Ref Range Status   MRSA by PCR NEGATIVE NEGATIVE Final    Comment:        The GeneXpert MRSA Assay (FDA approved for NASAL specimens only), is one component of a comprehensive MRSA colonization surveillance program. It is not intended to diagnose MRSA infection nor to guide or monitor treatment for MRSA infections. Performed at [facility], [place] [address], [place], [place] [address]   C difficile quick scan w PCR reflex     Status: None   Collection Time: 05/31/18  9:11 AM  Result Value Ref Range Status   C Diff antigen NEGATIVE NEGATIVE Final   C Diff toxin NEGATIVE NEGATIVE Final   C Diff interpretation No C. difficile detected.  Final    Comment: Performed at [facility], [place] [address] [place],  [address]    Radiology Reports Ct Abdomen Pelvis Wo Contrast  Result Date: 05/28/2018 CLINICAL DATA:  New tiny right lung nodule on chest radiograph. Severe right lower quadrant abdominal pain with nausea, vomiting and diarrhea. Chronic kidney disease. Prior cholecystectomy 04/19/2018, hysterectomy and appendectomy. Prior bowel resection. EXAM: CT CHEST, ABDOMEN AND PELVIS WITHOUT CONTRAST TECHNIQUE: Multidetector CT imaging of the chest, abdomen and pelvis was performed following the standard protocol without IV contrast. COMPARISON:  Chest radiograph from earlier today. FINDINGS: CT CHEST FINDINGS Cardiovascular: Borderline mild cardiomegaly. No significant pericardial effusion/thickening. 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 Atherosclerotic nonaneurysmal thoracic aorta. Proximal left subclavian artery stent is noted. Dilated main pulmonary artery (4.0 cm diameter). Mediastinum/Nodes: No discrete thyroid nodules.  Unremarkable esophagus. No axillary adenopathy. Mild right paratracheal adenopathy up to 1.1 cm (series 3/image 21). Mildly enlarged 1.1 cm subcarinal node (series 3/image 26). No discrete hilar adenopathy on this noncontrast scan. Lungs/Pleura: No  pneumothorax. No pleural effusion. A few tiny calcified granulomas are present in the upper lobes bilaterally. No acute consolidative airspace disease, lung masses or significant pulmonary nodules. Diffuse interlobular septal thickening. Musculoskeletal: No aggressive appearing focal osseous lesions. Intact sternotomy wires. Moderate thoracic spondylosis. CT ABDOMEN PELVIS FINDINGS Hepatobiliary: Normal liver with no liver mass. Cholecystectomy. Bile ducts are within normal post cholecystectomy limits with CBD diameter 7 mm. Pancreas: Thickening of the pancreatic head and neck. Prominent peripancreatic fat stranding and ill-defined fluid in the pancreatic head and neck, worsened. Poorly defined 5.1 x 2.5 cm anterior pancreatic head complex fluid collection with heterogeneous internal density (series 3/image 66), increased from 2.3 x 1.2 cm. No definite pancreatic duct dilation. Severe wall thickening throughout the adjacent descending duodenum, worsened. Fluid collection inferior to the gallbladder fossa measures 5.0 x 3.0 cm (series 3/image 71) and demonstrates layering hyperdense material, new. Spleen: Normal size. No mass. Adrenals/Urinary Tract: Normal adrenals. No hydronephrosis. No renal stones. Partially exophytic simple 1.3 cm posterior interpolar left renal cyst. No additional contour deforming renal lesions. Normal bladder. Stomach/Bowel: Normal non-distended stomach. Normal caliber small bowel with no small bowel wall thickening. Appendectomy. New segmental wall thickening in the hepatic flexure of the colon. No additional sites of large bowel wall thickening. Stable postsurgical changes from distal large bowel resection. Vascular/Lymphatic: Atherosclerotic  abdominal aorta with stable 5.0 cm infrarenal abdominal aortic aneurysm status post aorto bi-iliac stent graft repair. Surgical clips are noted in the right inguinal region. No pathologically enlarged lymph nodes in the abdomen or pelvis. Reproductive: Status post hysterectomy, with no abnormal findings at the vaginal cuff. No adnexal mass. Other: No pneumoperitoneum.  New small volume perihepatic ascites. Musculoskeletal: No aggressive appearing focal osseous lesions. Mild lumbar spondylosis. IMPRESSION: CT CHEST: 1. No lung masses or significant pulmonary nodules. 2. Borderline mild cardiomegaly. Diffuse interlobular septal thickening suggests mild pulmonary edema. No pleural effusions. 3. Dilated main pulmonary artery, suggesting pulmonary arterial hypertension. CT ABDOMEN PELVIS: 1. Worsening severe inflammatory changes surrounding the pancreatic head and neck, descending duodenum and hepatic flexure of the colon. Enlarging nonspecific complex poorly defined fluid collection in the anterior pancreatic head. New complex fluid collection inferior to the gallbladder fossa with layering hyperdense material. Burtis Junes worsening pancreatitis with enlarging pancreatic pseudocysts. Marked wall thickening in the descending duodenum has worsened, and could be reactive, with peptic ulcer disease difficult to exclude. No free air. Short-term follow-up MRI or CT abdomen with IV contrast suggested. 2. New wall thickening throughout the hepatic flexure of the colon, probably reactive. 3. Cholecystectomy. No intrahepatic biliary ductal dilatation. CBD diameter 7 mm, within normal post cholecystectomy limits. 4. New small volume perihepatic ascites. 5.  Aortic Atherosclerosis (ICD10-I70.0). Electronically Signed   By: Ilona Sorrel M.D.   On: 05/28/2018 20:41   Ct Abdomen Pelvis Wo Contrast  Result Date: 05/16/2018 CLINICAL DATA:  Acute abdominal pain.  Cholecystectomy last month. EXAM: CT ABDOMEN AND PELVIS WITHOUT CONTRAST  TECHNIQUE: Multidetector CT imaging of the abdomen and pelvis was performed following the standard protocol without IV contrast. COMPARISON:  Multiple prior exams most recent CT 04/18/2018 FINDINGS: Lower chest: Improved pleural effusions with small right greater than left residual. Mild septal thickening at the lung bases. Coronary artery calcifications. Mild cardiomegaly. Hepatobiliary: No focal hepatic lesion on noncontrast exam. Postcholecystectomy with clips in the gallbladder fossa. Small hyperdensity in the gallbladder fossa may be cystic duct remnant with small stone. No biliary dilatation. Pancreas: Similar appearance of peripancreatic fat stranding with stranding involving the region of  the duodenum. Previously seen fluid collections in the pancreatic head are not definitively visualized, lack of IV contrast limits detailed assessment. No evidence of progressive fluid collection. No ductal dilatation. Spleen: Normal in size. Adrenals/Urinary Tract: Mild left adrenal thickening without dominant nodule. Right adrenal gland is normal. Calcifications of both renal hila may be vascular or nonobstructing stones, grossly unchanged from prior exam. No hydronephrosis. Small low-density lesion arising from the left kidney, likely cysts. Urinary bladder is nondistended and not well evaluated. Stomach/Bowel: Again seen wall thickening about the distal stomach and duodenum, not well assessed in the absence of contrast. Degree of stranding has slightly increased from prior exam. No other small or large bowel inflammation or wall thickening. Enteric sutures in the sigmoid colon. Prior appendectomy. Vascular/Lymphatic: Aorto bi-iliac stent graft. Residual aneurysmal diameter of 5.1 cm, unchanged. Stent at the origin of both renal arteries. Aortic and peripheral atherosclerosis. Diffuse atherosclerosis of external iliac arteries. Reproductive: Status post hysterectomy. No adnexal masses. Other: Fluid collection in the left  retroperitoneum inferior to the left kidney and lateral to the psoas muscle currently measures 4.5 x 3.4 cm, previously 5.5 x 4 cm. No internal air. Upper abdominal stranding and edema. No frank ascites. No free air. Musculoskeletal: There are no acute or suspicious osseous abnormalities. IMPRESSION: 1. Persistent fat stranding and poorly defined fat planes in the region of the pancreatic head and duodenum. Favor persistent or recurrent pancreatitis over duodenitis. Previous small fluid collections in the region of the pancreatic head are not currently visualized and may have resolved, lack of contrast limits assessment. If the patient can tolerate breath hold technique, pancreatic protocol MRI may be of value to further assess. 2. Interval cholecystectomy. Small hyperdensity in the gallbladder fossa may be gallstone within the cystic duct remnant, no evidence of inflammatory change. 3. Decreasing size of left retroperitoneal hematoma. 4. Decreased bilateral pleural effusions from prior exam with mild residual. Septal thickening in the lung bases suspicious for pulmonary edema. 5. Aorto bi-iliac stent graft repair of abdominal aortic aneurysm with unchanged residual aortic diameter. Diffuse aortic and branch atherosclerosis. Aortic Atherosclerosis (ICD10-I70.0). 6. Electronically Signed   By: Keith Rake M.D.   On: 05/16/2018 07:07   Ct Chest Wo Contrast  Result Date: 05/28/2018 CLINICAL DATA:  New tiny right lung nodule on chest radiograph. Severe right lower quadrant abdominal pain with nausea, vomiting and diarrhea. Chronic kidney disease. Prior cholecystectomy 04/19/2018, hysterectomy and appendectomy. Prior bowel resection. EXAM: CT CHEST, ABDOMEN AND PELVIS WITHOUT CONTRAST TECHNIQUE: Multidetector CT imaging of the chest, abdomen and pelvis was performed following the standard protocol without IV contrast. COMPARISON:  Chest radiograph from earlier today. FINDINGS: CT CHEST FINDINGS Cardiovascular:  Borderline mild cardiomegaly. No significant pericardial effusion/thickening. 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 Atherosclerotic nonaneurysmal thoracic aorta. Proximal left subclavian artery stent is noted. Dilated main pulmonary artery (4.0 cm diameter). Mediastinum/Nodes: No discrete thyroid nodules. Unremarkable esophagus. No axillary adenopathy. Mild right paratracheal adenopathy up to 1.1 cm (series 3/image 21). Mildly enlarged 1.1 cm subcarinal node (series 3/image 26). No discrete hilar adenopathy on this noncontrast scan. Lungs/Pleura: No pneumothorax. No pleural effusion. A few tiny calcified granulomas are present in the upper lobes bilaterally. No acute consolidative airspace disease, lung masses or significant pulmonary nodules. Diffuse interlobular septal thickening. Musculoskeletal: No aggressive appearing focal osseous lesions. Intact sternotomy wires. Moderate thoracic spondylosis. CT ABDOMEN PELVIS FINDINGS Hepatobiliary: Normal liver with no liver mass. Cholecystectomy. Bile ducts are within normal post cholecystectomy limits with  CBD diameter 7 mm. Pancreas: Thickening of the pancreatic head and neck. Prominent peripancreatic fat stranding and ill-defined fluid in the pancreatic head and neck, worsened. Poorly defined 5.1 x 2.5 cm anterior pancreatic head complex fluid collection with heterogeneous internal density (series 3/image 66), increased from 2.3 x 1.2 cm. No definite pancreatic duct dilation. Severe wall thickening throughout the adjacent descending duodenum, worsened. Fluid collection inferior to the gallbladder fossa measures 5.0 x 3.0 cm (series 3/image 71) and demonstrates layering hyperdense material, new. Spleen: Normal size. No mass. Adrenals/Urinary Tract: Normal adrenals. No hydronephrosis. No renal stones. Partially exophytic simple 1.3 cm posterior interpolar left renal cyst. No additional contour deforming renal lesions. Normal bladder.  Stomach/Bowel: Normal non-distended stomach. Normal caliber small bowel with no small bowel wall thickening. Appendectomy. New segmental wall thickening in the hepatic flexure of the colon. No additional sites of large bowel wall thickening. Stable postsurgical changes from distal large bowel resection. Vascular/Lymphatic: Atherosclerotic abdominal aorta with stable 5.0 cm infrarenal abdominal aortic aneurysm status post aorto bi-iliac stent graft repair. Surgical clips are noted in the right inguinal region. No pathologically enlarged lymph nodes in the abdomen or pelvis. Reproductive: Status post hysterectomy, with no abnormal findings at the vaginal cuff. No adnexal mass. Other: No pneumoperitoneum.  New small volume perihepatic ascites. Musculoskeletal: No aggressive appearing focal osseous lesions. Mild lumbar spondylosis. IMPRESSION: CT CHEST: 1. No lung masses or significant pulmonary nodules. 2. Borderline mild cardiomegaly. Diffuse interlobular septal thickening suggests mild pulmonary edema. No pleural effusions. 3. Dilated main pulmonary artery, suggesting pulmonary arterial hypertension. CT ABDOMEN PELVIS: 1. Worsening severe inflammatory changes surrounding the pancreatic head and neck, descending duodenum and hepatic flexure of the colon. Enlarging nonspecific complex poorly defined fluid collection in the anterior pancreatic head. New complex fluid collection inferior to the gallbladder fossa with layering hyperdense material. Burtis Junes worsening pancreatitis with enlarging pancreatic pseudocysts. Marked wall thickening in the descending duodenum has worsened, and could be reactive, with peptic ulcer disease difficult to exclude. No free air. Short-term follow-up MRI or CT abdomen with IV contrast suggested. 2. New wall thickening throughout the hepatic flexure of the colon, probably reactive. 3. Cholecystectomy. No intrahepatic biliary ductal dilatation. CBD diameter 7 mm, within normal post  cholecystectomy limits. 4. New small volume perihepatic ascites. 5.  Aortic Atherosclerosis (ICD10-I70.0). Electronically Signed   By: Ilona Sorrel M.D.   On: 05/28/2018 20:41   Dg Chest Portable 1 View  Result Date: 05/28/2018 CLINICAL DATA:  Severe right lower quadrant pain. EXAM: PORTABLE CHEST 1 VIEW COMPARISON:  04/18/2018 FINDINGS: The lungs are clear without focal pneumonia, edema, pneumothorax or pleural effusion. Interstitial markings are diffusely coarsened with chronic features. Tiny nodular density identified right upper lung, superimposed on anterior right second rib. Patient is status post median sternotomy. Left atrial appendage occluded device noted. Vascular stent superimposed on the transverse aorta. The visualized bony structures of the thorax are intact. IMPRESSION: Cardiomegaly with mild vascular congestion or chronic interstitial coarsening. Tiny nodule right upper lung not visible on prior studies and indeterminate. CT chest without contrast could be used to further evaluate. Electronically Signed   By: Misty Stanley M.D.   On: 05/28/2018 17:23   Dg Abd 2 Views  Result Date: 05/30/2018 CLINICAL DATA:  History of pancreatitis.  Abdominal pain. EXAM: ABDOMEN - 2 VIEW COMPARISON:  Chest radiograph 05/29/2018 FINDINGS: Feeding tube tip projects at the gastric antrum/proximal duodenum, unchanged. Aortic stent graft material. Monitoring leads overlie the patient. Gas is demonstrated within nondilated loops  of large and small bowel. Small left pleural effusion. Heterogeneous opacities left lung base. IMPRESSION: Feeding tube projects at the gastric antrum/proximal duodenum, unchanged. Decreased gaseous distention of the stomach and small bowel. Electronically Signed   By: Lovey Newcomer M.D.   On: 05/30/2018 10:49   Dg Abd Portable 1v  Result Date: 05/29/2018 CLINICAL DATA:  Feeding tube placement EXAM: PORTABLE ABDOMEN - 1 VIEW COMPARISON:  None. FINDINGS: Gaseous distension of the stomach  and proximal small bowel. Feeding tube with the tip projecting over antrum versus proximal duodenum. No evidence of pneumoperitoneum, portal venous gas or pneumatosis. No pathologic calcifications along the expected course of the ureters. Aorto bi-iliac stent graft is noted. Left renal artery stent is noted. Osseous structures are unremarkable. IMPRESSION: Gaseous distension of the stomach and proximal small bowel. Feeding tube with the tip projecting over antrum versus proximal duodenum. Electronically Signed   By: Kathreen Devoid   On: 05/29/2018 15:30    Lab Data:  CBC: Recent Labs  Lab 05/29/18 1528 05/30/18 0236 05/31/18 0522 06/01/18 0450 06/02/18 0608  WBC 14.1* 14.4* 8.2 9.6 12.7*  NEUTROABS 12.9*  --   --   --   --   HGB 8.2* 8.4* 7.7* 6.7* 8.8*  HCT 26.7* 27.1* 24.7*  24.3* 21.0* 27.6*  MCV 100.4* 100.0 98.8 98.1 96.5  PLT 218 220 207 178 350   Basic Metabolic Panel: Recent Labs  Lab 05/29/18 0335 05/29/18 1528 05/30/18 0236 05/31/18 0522 06/01/18 0450 06/01/18 1841 06/02/18 0608  NA 142 141 140 140 137  --  137  K 3.0* 3.7 4.1 3.1* 2.8* 3.8 4.3  CL 101 100 100 99 99  --  99  CO2 28 25 24 30 29   --  28  GLUCOSE 102* 85 149* 173* 184*  --  119*  BUN 31* 32* 35* 34* 33*  --  36*  CREATININE 1.91* 1.98* 2.27* 1.94* 1.76*  --  1.66*  CALCIUM 7.4* 7.8* 7.7* 7.8* 7.2*  --  7.7*  MG 1.3* 2.1 2.1  --  1.6*  --  2.4  PHOS  --   --   --   --  1.4*  --  2.7   GFR: Estimated Creatinine Clearance: 30.2 mL/min (A) (by C-G formula based on SCr of 1.66 mg/dL (H)). Liver Function Tests: Recent Labs  Lab 05/29/18 1528 05/30/18 0236 05/31/18 0522 06/01/18 0450 06/02/18 0608  AST 20 17 15  11* 16  ALT 7 8 7 5 6   ALKPHOS 36* 36* 34* 36* 41  BILITOT 1.4* 1.8* 0.8 0.5 0.6  PROT 4.5* 4.9* 4.8* 4.4* 4.8*  ALBUMIN 1.9* 1.9* 1.7* 1.5* 1.7*   Recent Labs  Lab 05/29/18 0335 05/30/18 0236 05/31/18 0522 06/01/18 0450 06/02/18 0608  LIPASE 667* 270* 162* 121* 106*   No  results for input(s): AMMONIA in the last 168 hours. Coagulation Profile: Recent Labs  Lab 05/28/18 2325  INR 1.13   Cardiac Enzymes: Recent Labs  Lab 05/29/18 0335 05/29/18 0954 05/29/18 1528 05/29/18 2013 05/30/18 0236  TROPONINI 0.35* 0.52* 0.44* 0.34* 0.31*   BNP (last 3 results) No results for input(s): PROBNP in the last 8760 hours. HbA1C: No results for input(s): HGBA1C in the last 72 hours. CBG: Recent Labs  Lab 06/02/18 0233 06/02/18 0648 06/02/18 0834 06/02/18 1012 06/02/18 1205  GLUCAP 162* 114* 153* 154* 164*   Lipid Profile: No results for input(s): CHOL, HDL, LDLCALC, TRIG, CHOLHDL, LDLDIRECT in the last 72 hours. Thyroid Function Tests: No results  for input(s): TSH, T4TOTAL, FREET4, T3FREE, THYROIDAB in the last 72 hours. Anemia Panel: Recent Labs    05/31/18 0522  VITAMINB12 307   Urine analysis:    Component Value Date/Time   COLORURINE YELLOW 05/28/2018 0506   APPEARANCEUR HAZY (A) 05/28/2018 0506   LABSPEC 1.021 05/28/2018 0506   PHURINE 5.0 05/28/2018 0506   GLUCOSEU NEGATIVE 05/28/2018 0506   HGBUR NEGATIVE 05/28/2018 0506   BILIRUBINUR NEGATIVE 05/28/2018 0506   KETONESUR 5 (A) 05/28/2018 0506   PROTEINUR NEGATIVE 05/28/2018 0506   UROBILINOGEN 0.2 03/04/2011 2107   NITRITE NEGATIVE 05/28/2018 0506   LEUKOCYTESUR SMALL (A) 05/28/2018 0506     Angelita Harnack M.D. [facility] 06/02/2018, 12:40 PM  Pager: [phone] Between 7am to 7pm - call Pager - [phone]  After 7pm go to [email or web address] - password [email or web address]  Call night coverage person covering after 7pm

## 2018-06-02 NOTE — Progress Notes (Signed)
Nutrition Follow-up  DOCUMENTATION CODES:   Severe malnutrition in context of chronic illness  INTERVENTION:   Consider increasing frequency and/or dose of Creon therapy to assist with diarrhea if malabsorption is a possibility; TF is 24 hour continuous feeding and pt would need creon therapy spaced over 24 hours, not just TID with meals  Tube Feeding: Trial of Changing TF formula to Osmolite 1.2  Osmolite 1.2 @ 65 ml/hr Provides 1872 kcals, 87 g of protein and 1264 mL of free water   Unsure that changing formula will help with diarrhea as there are multiple factors that could be contributing to diarrhea including antibiotic therapy (zosyn), pancreatitis itself, creon therapy possibly underdosed and needing to be adjusted, etc but will change formula to Osmolite and see if this helps. If not recommend changing formula back to Vital AF 1.2   If diarrhea worsens with change in formula, recommend changing TF back to Vital AF 1.2 @ 60 ml/hr   NUTRITION DIAGNOSIS:   Severe Malnutrition related to chronic illness(CHF, AAA s/p graft repair, pancreatitis) as evidenced by severe muscle depletion, moderate muscle depletion, mild fat depletion, moderate fat depletion, energy intake < or equal to 75% for > or equal to 1 month.  Being addressed via TF  GOAL:   Patient will meet greater than or equal to 90% of their needs  Met  MONITOR:   Diet advancement, Labs, TF tolerance, I & O's, Weight trends  REASON FOR ASSESSMENT:   Consult Enteral/tube feeding initiation and management  ASSESSMENT:   Patient with PMH significant for HTN, HLD, DM, COPD, GERD, gout, depression with anxiety, tobacco abuse, AAA s/p stent graft repair 01/2018, CAD, CABG, CHF, CKD IV s/p AV fistula for anticipated transition to HD, GI bleeding, mitral valve regurgitation, PAD, and s/p lap chole 04/19/18. Presents this admission with abdominal pain. Admitted for recurrent vs never resolved acute pancreatitis and GIB.    Spoke with pt today; tolerating some CL trays, mostly just jello.  Pt reports her reflux is improving and diarrhea frequency has decreased; 2 days ago, pt with 6-7 BMs. Now pt with 2-3 BMs  Vital AF 1.2 infusing at 60 ml/hr via Cortrak tube  Vital AF 1.2, is a peptide-based formula designed to help manage inflammation, impaired GI function and GI intolerance. Do not recommend changing formula at this time. Unsure that changing formula will help with diarrhea as there are multiple factors that could be contributing to diarrhea including antibiotic therapy (zosyn), pancreatitis itself, creon therapy possibly underdosed and needing to be adjusted, etc but will change formula to Osmolite and see if this helps. If not recommend changing formula back to Vital AF 1.2   Osmolite 1.2 contains no fiber, has lower osmolality and lower fat content than Vital AF 1.2   Electrolytes improved to wdl with supplementation  Labs: Creatinine 1.66, BUN 36, lipase 106 Meds: LR at 50 ml/hr, remeron, protonix, zosyn, K phos, imodium prn, cholecalciferol  Diet Order:   Diet Order            Diet clear liquid Room service appropriate? Yes; Fluid consistency: Thin  Diet effective now              EDUCATION NEEDS:   Not appropriate for education at this time  Skin:  Skin Assessment: Reviewed RN Assessment  Last BM:  9/27 loose stool but frequency reduced  Height:   Ht Readings from Last 1 Encounters:  05/30/18 '5\' 5"'$  (1.651 m)    Weight:  Wt Readings from Last 1 Encounters:  05/30/18 56.6 kg    Ideal Body Weight:  56.8 kg  BMI:  Body mass index is 20.76 kg/m.  Estimated Nutritional Needs:   Kcal:  1700-1900 kcal  Protein:  85-100 g  Fluid:  >/= 1.7 L/day   Kerman Passey MS, RD, LDN, CNSC [phone] Pager  [phone] Weekend/On-Call Pager

## 2018-06-02 NOTE — Progress Notes (Signed)
[facility] Progress Note   Chief Complaint:   pancreatitis  SUBJECTIVE:    still has RLQ discomfort, worse when moving about. No nausea. Still having loose stool   ASSESSMENT AND PLAN:   1. 67 yo female with recurrent vs unresolved pancreatitis (from episode in mid August). CT scan 4 days ago >> worsening peripancreatic inflammation new complex fluid collections favored to be enlarging pseudocysts. Pancreatitis was presumably gallstone related but still looking for other etiologies. IgG, subclass 4 normal at 13. She hasn't had imaging with contrast due to renal function.  I spoke with Dr. Posey Pronto yesterday and feels pretty strong about risk of IV contrast with her renal function.  -s/p lap mid Aug for presumed biliary pancreatitis -taking in some clears. TF in progress -ambulating only to bedside commode. Would be good to get her up to bedside chair.   2. Loose stool, C-diff negative. On Zosyn (non C-diff antibiotic associated diarrhea), enteral feeding related?   3. Recurrent acute on chronic anemia, heme positive x 3.  EGD last admission (mid Aug) for anemia was revealing. HGb up from 6.7 to 8.8 post 2 units of blood yesterday -some of the slow decline in hgb could be dilutional. IVF only at 50 ml / hr at present so certainly should affect hgb going forward.  -? Bleeding into pseudocyst vrs GI bleed. No gross blood, just Heme positive. Severe wall thickening of descending duodenum on CT scan but likely this is reactive. There was also segmental wall thickening in hepatic flexure area on CT scan but if colitis but expect some overt bleeding. LiIkely reactive as well. Again, renal function precluding IV contrast so holding off on repeat imaging for now.  -she is scheduled for colonoscopy with [place] late October. She probably won't be medically stable enough for it.  -borderline macrocytic. B12 , RBC folate both normal.  4. AKI on CKD. Cr continues to improve, down to 1.55  today.    5. CAD / DES 2016 on plavix / diastolic chf. Of note she is positive 7 liters fluid balance this admit if documentation correct    OBJECTIVE:     Vital signs in last 24 hours: Temp:  [97.7 F (36.5 C)-98.1 F (36.7 C)] 97.7 F (36.5 C) (09/27 1006) Pulse Rate:  [64-71] 68 (09/27 1006) Resp:  [16-18] 16 (09/27 1006) BP: (127-142)/(38-53) 140/53 (09/27 1006) SpO2:  [91 %-100 %] 93 % (09/27 1006) Last BM Date: 06/01/18 General:   Alert, female in NAD EENT:  Normal hearing, non icteric sclera, conjunctive pink.  Heart:  Regular rate and rhythm. No lower extremity edema Pulm: Normal respiratory effort Abdomen:  Soft, nondistended, mild RLQ tenderness.  Normal bowel sounds, no masses felt.     Neurologic:  Alert and  oriented x4;  grossly normal neurologically. Psych:  Pleasant, cooperative.  Normal mood and affect.   Intake/Output from previous day: 09/26 0701 - 09/27 0700 In: 2899.7 [P.O.:180; I.V.:1697.1; Blood:330; NG/GT:592.7; IV Piggyback:99.9] Out: 401 [Urine:400; Stool:1] Intake/Output this shift: No intake/output data recorded.  Lab Results: Recent Labs    05/31/18 0522 06/01/18 0450 06/02/18 0608  WBC 8.2 9.6 12.7*  HGB 7.7* 6.7* 8.8*  HCT 24.7*  24.3* 21.0* 27.6*  PLT 207 178 202   BMET Recent Labs    05/31/18 0522 06/01/18 0450 06/01/18 1841 06/02/18 0608  NA 140 137  --  137  K 3.1* 2.8* 3.8 4.3  CL 99 99  --  99  CO2 30 29  --  28  GLUCOSE 173* 184*  --  119*  BUN 34* 33*  --  36*  CREATININE 1.94* 1.76*  --  1.66*  CALCIUM 7.8* 7.2*  --  7.7*   LFT Recent Labs    06/02/18 0608  PROT 4.8*  ALBUMIN 1.7*  AST 16  ALT 6  ALKPHOS 41  BILITOT 0.6     Principal Problem:   Pancreatitis, recurrent Active Problems:   TOBACCO ABUSE   Essential hypertension   Atrial fibrillation (HCC)   COPD (chronic obstructive pulmonary disease) (HCC)   S/P CABG x 3   Anemia   GERD (gastroesophageal reflux disease)   Hyperlipidemia   CAD  (coronary artery disease)   Chronic kidney disease (CKD), stage IV (severe) (HCC)   Chronic diastolic congestive heart failure (HCC)   Type II diabetes mellitus with renal manifestations (HCC)   Elevated troponin   Hypertensive urgency   Hypokalemia   SIRS (systemic inflammatory response syndrome) (HCC)   Protein-calorie malnutrition, severe     LOS: 5 days   Tye Savoy ,NP 06/02/2018, 10:23 AM

## 2018-06-03 LAB — COMPREHENSIVE METABOLIC PANEL
ALBUMIN: 1.7 g/dL — AB (ref 3.5–5.0)
ALK PHOS: 41 U/L (ref 38–126)
ALT: 8 U/L (ref 0–44)
ANION GAP: 11 (ref 5–15)
AST: 19 U/L (ref 15–41)
BUN: 42 mg/dL — ABNORMAL HIGH (ref 8–23)
CO2: 29 mmol/L (ref 22–32)
Calcium: 7.7 mg/dL — ABNORMAL LOW (ref 8.9–10.3)
Chloride: 95 mmol/L — ABNORMAL LOW (ref 98–111)
Creatinine, Ser: 1.74 mg/dL — ABNORMAL HIGH (ref 0.44–1.00)
GFR calc Af Amer: 34 mL/min — ABNORMAL LOW (ref >60)
GFR calc non Af Amer: 30 mL/min — ABNORMAL LOW (ref >60)
GLUCOSE: 142 mg/dL — AB (ref 70–99)
POTASSIUM: 4.2 mmol/L (ref 3.5–5.1)
SODIUM: 135 mmol/L (ref 135–145)
Total Bilirubin: 0.5 mg/dL (ref 0.3–1.2)
Total Protein: 5.1 g/dL — ABNORMAL LOW (ref 6.5–8.1)

## 2018-06-03 LAB — GLUCOSE, CAPILLARY
GLUCOSE-CAPILLARY: 169 mg/dL — AB (ref 70–99)
GLUCOSE-CAPILLARY: 176 mg/dL — AB (ref 70–99)
GLUCOSE-CAPILLARY: 165 mg/dL — AB (ref 70–99)
Glucose-Capillary: 205 mg/dL — ABNORMAL HIGH (ref 70–99)
Glucose-Capillary: 143 mg/dL — ABNORMAL HIGH (ref 70–99)
Glucose-Capillary: 220 mg/dL — ABNORMAL HIGH (ref 70–99)

## 2018-06-03 LAB — CBC
HEMATOCRIT: 25.9 % — AB (ref 36.0–46.0)
HEMOGLOBIN: 8.4 g/dL — AB (ref 12.0–15.0)
MCH: 31.2 pg (ref 26.0–34.0)
MCHC: 32.4 g/dL (ref 30.0–36.0)
MCV: 96.3 fL (ref 78.0–100.0)
Platelets: 236 10*3/uL (ref 150–400)
RBC: 2.69 MIL/uL — AB (ref 3.87–5.11)
RDW: 16.8 % — ABNORMAL HIGH (ref 11.5–15.5)
WBC: 13.7 10*3/uL — ABNORMAL HIGH (ref 4.0–10.5)

## 2018-06-03 LAB — PHOSPHORUS: Phosphorus: 4.1 mg/dL (ref 2.5–4.6)

## 2018-06-03 LAB — MAGNESIUM: Magnesium: 2 mg/dL (ref 1.7–2.4)

## 2018-06-03 LAB — LIPASE, BLOOD: Lipase: 103 U/L — ABNORMAL HIGH (ref 11–51)

## 2018-06-03 MED ORDER — K PHOS MONO-SOD PHOS DI & MONO 155-852-130 MG PO TABS
250.0000 mg | ORAL_TABLET | Freq: Every day | ORAL | Status: DC
  Administered 2018-06-04: 250 mg via ORAL
  Filled 2018-06-03: qty 1

## 2018-06-03 NOTE — Progress Notes (Signed)
[facility]                                                                              Patient Demographics  [name], is a 67 y.o. female, DOB - Feb 22, 1951, UKG:[id number]  Admit date - 05/28/2018   Admitting Physician Ivor Costa, MD  Outpatient Primary MD for the patient is Bernerd Limbo, MD  Outpatient specialists:   LOS - 6  days   Medical records reviewed and are as summarized below:    Chief Complaint  Patient presents with  . Abdominal Pain  . Emesis       Brief summary   Patient is a 67 year old female with hypertension, hyperlipidemia, diabetesCOPD on home oxygen, GERD, gout, depression with anxiety, tobacco abuse, atrial fibrillation not on anticoagulants, AAA, CAD, CABG, CHF, CKD-4, GI bleeding, mitral valve regurgitation, PAD who presented with abdominal pain. Patient was recently hospitalized from 8/4-8/16 due to GI bleeding required blood transfusion and gallstone pancreatitis.  Patient underwent laparoscopic cholecystectomy.  She was discharged in stable condition.  Patient presented back with nausea vomiting abdominal pain, poor oral intake.    Assessment & Plan    Principal Problem:   Pancreatitis, recurrent with abdominal pain nausea and vomiting with pseudocyst -Status post cholecystectomy last month on 8/14 -GI following closely, CT abdomen pelvis showed worsening severe inflammatory changes surrounding the pancreatic head and neck, descending duodenum, hepatic flexure of colon.  New complex fluid collection inferior to the gallbladder fossa with layering hyperdense material, favor worsening pancreatitis with enlarging pancreatic pseudocyst. -Continue IV fluids, empiric Zosyn, pain control, feeding via core track  -GI following  Active Problems: Severe protein calorie malnutrition in the context of #1, chronic illness, diarrhea Patient diarrhea improving, 1 BM at night Appreciate dietitian recommendations   Hypokalemia,  hypomagnesemia, hypophosphatemia -Potassium phosphorus magnesium improving Decreased Neutra-Phos to 250 mg daily  Chronic kidney disease stage IV - Not on HD, AVF placement in 10/2017, aVF stenosis followed by vascular outpatient  GI bleed with acute on chronic anemia, CKD  critical illness Hemoglobin baseline around 8 -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  Cardiology following recommended outpatient stress test, not a candidate for any invasive cardiac catheterization at this point -2D echo showed normal LV function, moderate mitral stenosis, mild MR -Patient has been followed by cardiology, follow I's and O's closely, hold diuretics secondary to renal insufficiency  History of AAA repair in 03/6282 complicated with retroperitoneal bleed  COPD, tobacco use -Currently no wheezing, continue nicotine patch  Code Status: full  DVT Prophylaxis:  SCD's Family Communication: Discussed in detail with the patient, all imaging results, lab results explained to the patient and daughter, [name] on 9/27  Disposition Plan: Not medically ready, will remain inpatient due to multiple comorbidities, tube feeds, high risk of deterioration  Time Spent in minutes 25 minutes  Procedures:  CT abdomen 2D  echo  Consultants:   GI Cardiology  Antimicrobials:      Medications  Scheduled Meds: . sodium chloride   Intravenous Once  . allopurinol  100 mg Oral Daily  . ALPRAZolam  0.5 mg Oral QHS  . amLODipine  10 mg Oral Daily  . atorvastatin  40 mg Oral QPM  . carvedilol  25 mg Oral BID WC  . cholecalciferol  5,000 Units Oral Daily  . cloNIDine  0.2 mg Oral TID  . clopidogrel  75 mg Oral Q breakfast    . DULoxetine  60 mg Oral Daily  . famotidine  20 mg Oral QAC supper  . fenofibrate  160 mg Oral QHS  . gabapentin  300 mg Oral BID  . heparin  5,000 Units Subcutaneous Q8H  . hydrALAZINE  100 mg Oral TID  . insulin aspart  0-9 Units Subcutaneous Q4H  . isosorbide mononitrate  15 mg Oral Daily  . lipase/protease/amylase  12,000 Units Oral TID AC  . mirtazapine  7.5 mg Oral QHS  . mometasone-formoterol  2 puff Inhalation BID  . nicotine  21 mg Transdermal Daily  . pantoprazole sodium  40 mg Per Tube Daily  . phosphorus  500 mg Oral BID   Continuous Infusions: . sodium chloride 250 mL (06/01/18 2343)  . feeding supplement (OSMOLITE 1.2 CAL) 1,000 mL (06/03/18 1133)  . lactated ringers 50 mL/hr at 06/03/18 1125  . piperacillin-tazobactam (ZOSYN)  IV 3.375 g (06/03/18 1128)   PRN Meds:.sodium chloride, acetaminophen **OR** acetaminophen, alum & mag hydroxide-simeth, hydrALAZINE, HYDROcodone-acetaminophen, HYDROmorphone (DILAUDID) injection, hydrOXYzine, levalbuterol, loperamide, ondansetron (ZOFRAN) IV, polyethylene glycol, zolpidem   Antibiotics   Anti-infectives (From admission, onward)   Start     Dose/Rate Route Frequency Ordered Stop   05/29/18 0600  piperacillin-tazobactam (ZOSYN) IVPB 3.375 g     3.375 g 12.5 mL/hr over 240 Minutes Intravenous Every 8 hours 05/28/18 2227     05/28/18 2230  piperacillin-tazobactam (ZOSYN) IVPB 3.375 g     3.375 g 100 mL/hr over 30 Minutes Intravenous  Once 05/28/18 2227 05/29/18 0011        Subjective:   [name] was seen and examined today. Weak and frail. No acute issues.  1 BM overnight   Objective:   Vitals:   06/02/18 2011 06/03/18 0411 06/03/18 0919 06/03/18 0929  BP: (!) 135/48 (!) 147/66  (!) 150/63  Pulse: 70 72  68  Resp: 14 18  18   Temp: 98.7 F (37.1 C) 98.4 F (36.9 C)  98 F (36.7 C)  TempSrc: Oral Oral  Oral  SpO2: 96% 95% 94% 98%  Weight:      Height:        Intake/Output Summary (Last 24 hours) at  06/03/2018 1304 Last data filed at 06/03/2018 0900 Gross per 24 hour  Intake 2874.46 ml  Output 401 ml  Net 2473.46 ml     Wt Readings from Last 3 Encounters:  05/30/18 56.6 kg  05/15/18 55.8 kg  04/28/18 60.3 kg     Exam   General: Alert and oriented x 3, NAD  Eyes:   HEENT:  Atraumatic, normocephalic  Cardiovascular: S1 S2 auscultated, Regular rate and rhythm. No pedal edema b/l  Respiratory: Clear to auscultation bilaterally, no wheezing, rales or rhonchi  Gastrointestinal: Soft, right-sided tenderness, + bowel sounds  Ext: no pedal edema bilaterally  Neuro: no new FNDs  Musculoskeletal: No digital cyanosis, clubbing  Skin: No rashes  Psych: Normal affect and demeanor, alert and oriented x3  Data Reviewed:  I have personally reviewed following labs and imaging studies  Micro Results Recent Results (from the past 240 hour(s))  Blood culture (routine x 2)     Status: None   Collection Time: 05/28/18  5:54 PM  Result Value Ref Range Status   Specimen Description BLOOD RIGHT ANTECUBITAL  Final   Special Requests   Final    BOTTLES DRAWN AEROBIC AND ANAEROBIC Blood Culture results may not be optimal due to an excessive volume of blood received in culture bottles   Culture   Final    NO GROWTH 5 DAYS Performed at [facility] [address] [place], [place] [address]    Report Status 06/02/2018 FINAL  Final  Blood culture (routine x 2)     Status: None   Collection Time: 05/28/18  6:35 PM  Result Value Ref Range Status   Specimen Description BLOOD BLOOD RIGHT FOREARM  Final   Special Requests   Final    BOTTLES DRAWN AEROBIC ONLY Blood Culture results may not be optimal due to an inadequate volume of blood received in culture bottles   Culture   Final    NO GROWTH 5 DAYS Performed at [facility], [place] [address] [place], [place] [address]    Report Status 06/02/2018 FINAL  Final  MRSA PCR Screening     Status: None   Collection Time:  05/30/18  2:53 PM  Result Value Ref Range Status   MRSA by PCR NEGATIVE NEGATIVE Final    Comment:        The GeneXpert MRSA Assay (FDA approved for NASAL specimens only), is one component of a comprehensive MRSA colonization surveillance program. It is not intended to diagnose MRSA infection nor to guide or monitor treatment for MRSA infections. Performed at [facility], [place] [address], [place], [place] [address]   C difficile quick scan w PCR reflex     Status: None   Collection Time: 05/31/18  9:11 AM  Result Value Ref Range Status   C Diff antigen NEGATIVE NEGATIVE Final   C Diff toxin NEGATIVE NEGATIVE Final   C Diff interpretation No C. difficile detected.  Final    Comment: Performed at [facility], [place] [address], [place], [place] [address]    Radiology Reports Ct Abdomen Pelvis Wo Contrast  Result Date: 05/28/2018 CLINICAL DATA:  New tiny right lung nodule on chest radiograph. Severe right lower quadrant abdominal pain with nausea, vomiting and diarrhea. Chronic kidney disease. Prior cholecystectomy 04/19/2018, hysterectomy and appendectomy. Prior bowel resection. EXAM: CT CHEST, ABDOMEN AND PELVIS WITHOUT CONTRAST TECHNIQUE: Multidetector CT imaging of the chest, abdomen and pelvis was performed following the standard protocol without IV contrast. COMPARISON:  Chest radiograph from earlier today. FINDINGS: CT CHEST FINDINGS Cardiovascular: Borderline mild cardiomegaly. No significant pericardial effusion/thickening. 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 Atherosclerotic nonaneurysmal thoracic aorta. Proximal left subclavian artery stent is noted. Dilated main pulmonary artery (4.0 cm diameter). Mediastinum/Nodes: No discrete thyroid nodules. Unremarkable esophagus. No axillary adenopathy. Mild right paratracheal adenopathy up to 1.1 cm (series 3/image 21). Mildly enlarged 1.1 cm subcarinal node (series 3/image 26). No discrete hilar  adenopathy on this noncontrast scan. Lungs/Pleura: No pneumothorax. No pleural effusion. A few tiny calcified granulomas are present in the upper lobes bilaterally. No acute consolidative airspace disease, lung masses or significant pulmonary nodules. Diffuse interlobular septal thickening. Musculoskeletal: No aggressive appearing focal osseous lesions. Intact sternotomy wires. Moderate  thoracic spondylosis. CT ABDOMEN PELVIS FINDINGS Hepatobiliary: Normal liver with no liver mass. Cholecystectomy. Bile ducts are within normal post cholecystectomy limits with CBD diameter 7 mm. Pancreas: Thickening of the pancreatic head and neck. Prominent peripancreatic fat stranding and ill-defined fluid in the pancreatic head and neck, worsened. Poorly defined 5.1 x 2.5 cm anterior pancreatic head complex fluid collection with heterogeneous internal density (series 3/image 66), increased from 2.3 x 1.2 cm. No definite pancreatic duct dilation. Severe wall thickening throughout the adjacent descending duodenum, worsened. Fluid collection inferior to the gallbladder fossa measures 5.0 x 3.0 cm (series 3/image 71) and demonstrates layering hyperdense material, new. Spleen: Normal size. No mass. Adrenals/Urinary Tract: Normal adrenals. No hydronephrosis. No renal stones. Partially exophytic simple 1.3 cm posterior interpolar left renal cyst. No additional contour deforming renal lesions. Normal bladder. Stomach/Bowel: Normal non-distended stomach. Normal caliber small bowel with no small bowel wall thickening. Appendectomy. New segmental wall thickening in the hepatic flexure of the colon. No additional sites of large bowel wall thickening. Stable postsurgical changes from distal large bowel resection. Vascular/Lymphatic: Atherosclerotic abdominal aorta with stable 5.0 cm infrarenal abdominal aortic aneurysm status post aorto bi-iliac stent graft repair. Surgical clips are noted in the right inguinal region. No pathologically  enlarged lymph nodes in the abdomen or pelvis. Reproductive: Status post hysterectomy, with no abnormal findings at the vaginal cuff. No adnexal mass. Other: No pneumoperitoneum.  New small volume perihepatic ascites. Musculoskeletal: No aggressive appearing focal osseous lesions. Mild lumbar spondylosis. IMPRESSION: CT CHEST: 1. No lung masses or significant pulmonary nodules. 2. Borderline mild cardiomegaly. Diffuse interlobular septal thickening suggests mild pulmonary edema. No pleural effusions. 3. Dilated main pulmonary artery, suggesting pulmonary arterial hypertension. CT ABDOMEN PELVIS: 1. Worsening severe inflammatory changes surrounding the pancreatic head and neck, descending duodenum and hepatic flexure of the colon. Enlarging nonspecific complex poorly defined fluid collection in the anterior pancreatic head. New complex fluid collection inferior to the gallbladder fossa with layering hyperdense material. Burtis Junes worsening pancreatitis with enlarging pancreatic pseudocysts. Marked wall thickening in the descending duodenum has worsened, and could be reactive, with peptic ulcer disease difficult to exclude. No free air. Short-term follow-up MRI or CT abdomen with IV contrast suggested. 2. New wall thickening throughout the hepatic flexure of the colon, probably reactive. 3. Cholecystectomy. No intrahepatic biliary ductal dilatation. CBD diameter 7 mm, within normal post cholecystectomy limits. 4. New small volume perihepatic ascites. 5.  Aortic Atherosclerosis (ICD10-I70.0). Electronically Signed   By: Ilona Sorrel M.D.   On: 05/28/2018 20:41   Ct Abdomen Pelvis Wo Contrast  Result Date: 05/16/2018 CLINICAL DATA:  Acute abdominal pain.  Cholecystectomy last month. EXAM: CT ABDOMEN AND PELVIS WITHOUT CONTRAST TECHNIQUE: Multidetector CT imaging of the abdomen and pelvis was performed following the standard protocol without IV contrast. COMPARISON:  Multiple prior exams most recent CT 04/18/2018  FINDINGS: Lower chest: Improved pleural effusions with small right greater than left residual. Mild septal thickening at the lung bases. Coronary artery calcifications. Mild cardiomegaly. Hepatobiliary: No focal hepatic lesion on noncontrast exam. Postcholecystectomy with clips in the gallbladder fossa. Small hyperdensity in the gallbladder fossa may be cystic duct remnant with small stone. No biliary dilatation. Pancreas: Similar appearance of peripancreatic fat stranding with stranding involving the region of the duodenum. Previously seen fluid collections in the pancreatic head are not definitively visualized, lack of IV contrast limits detailed assessment. No evidence of progressive fluid collection. No ductal dilatation. Spleen: Normal in size. Adrenals/Urinary Tract: Mild left adrenal thickening without dominant  nodule. Right adrenal gland is normal. Calcifications of both renal hila may be vascular or nonobstructing stones, grossly unchanged from prior exam. No hydronephrosis. Small low-density lesion arising from the left kidney, likely cysts. Urinary bladder is nondistended and not well evaluated. Stomach/Bowel: Again seen wall thickening about the distal stomach and duodenum, not well assessed in the absence of contrast. Degree of stranding has slightly increased from prior exam. No other small or large bowel inflammation or wall thickening. Enteric sutures in the sigmoid colon. Prior appendectomy. Vascular/Lymphatic: Aorto bi-iliac stent graft. Residual aneurysmal diameter of 5.1 cm, unchanged. Stent at the origin of both renal arteries. Aortic and peripheral atherosclerosis. Diffuse atherosclerosis of external iliac arteries. Reproductive: Status post hysterectomy. No adnexal masses. Other: Fluid collection in the left retroperitoneum inferior to the left kidney and lateral to the psoas muscle currently measures 4.5 x 3.4 cm, previously 5.5 x 4 cm. No internal air. Upper abdominal stranding and edema. No  frank ascites. No free air. Musculoskeletal: There are no acute or suspicious osseous abnormalities. IMPRESSION: 1. Persistent fat stranding and poorly defined fat planes in the region of the pancreatic head and duodenum. Favor persistent or recurrent pancreatitis over duodenitis. Previous small fluid collections in the region of the pancreatic head are not currently visualized and may have resolved, lack of contrast limits assessment. If the patient can tolerate breath hold technique, pancreatic protocol MRI may be of value to further assess. 2. Interval cholecystectomy. Small hyperdensity in the gallbladder fossa may be gallstone within the cystic duct remnant, no evidence of inflammatory change. 3. Decreasing size of left retroperitoneal hematoma. 4. Decreased bilateral pleural effusions from prior exam with mild residual. Septal thickening in the lung bases suspicious for pulmonary edema. 5. Aorto bi-iliac stent graft repair of abdominal aortic aneurysm with unchanged residual aortic diameter. Diffuse aortic and branch atherosclerosis. Aortic Atherosclerosis (ICD10-I70.0). 6. Electronically Signed   By: Keith Rake M.D.   On: 05/16/2018 07:07   Ct Chest Wo Contrast  Result Date: 05/28/2018 CLINICAL DATA:  New tiny right lung nodule on chest radiograph. Severe right lower quadrant abdominal pain with nausea, vomiting and diarrhea. Chronic kidney disease. Prior cholecystectomy 04/19/2018, hysterectomy and appendectomy. Prior bowel resection. EXAM: CT CHEST, ABDOMEN AND PELVIS WITHOUT CONTRAST TECHNIQUE: Multidetector CT imaging of the chest, abdomen and pelvis was performed following the standard protocol without IV contrast. COMPARISON:  Chest radiograph from earlier today. FINDINGS: CT CHEST FINDINGS Cardiovascular: Borderline mild cardiomegaly. No significant pericardial effusion/thickening. 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 Atherosclerotic nonaneurysmal thoracic aorta.  Proximal left subclavian artery stent is noted. Dilated main pulmonary artery (4.0 cm diameter). Mediastinum/Nodes: No discrete thyroid nodules. Unremarkable esophagus. No axillary adenopathy. Mild right paratracheal adenopathy up to 1.1 cm (series 3/image 21). Mildly enlarged 1.1 cm subcarinal node (series 3/image 26). No discrete hilar adenopathy on this noncontrast scan. Lungs/Pleura: No pneumothorax. No pleural effusion. A few tiny calcified granulomas are present in the upper lobes bilaterally. No acute consolidative airspace disease, lung masses or significant pulmonary nodules. Diffuse interlobular septal thickening. Musculoskeletal: No aggressive appearing focal osseous lesions. Intact sternotomy wires. Moderate thoracic spondylosis. CT ABDOMEN PELVIS FINDINGS Hepatobiliary: Normal liver with no liver mass. Cholecystectomy. Bile ducts are within normal post cholecystectomy limits with CBD diameter 7 mm. Pancreas: Thickening of the pancreatic head and neck. Prominent peripancreatic fat stranding and ill-defined fluid in the pancreatic head and neck, worsened. Poorly defined 5.1 x 2.5 cm anterior pancreatic head complex fluid collection with heterogeneous internal density (  series 3/image 66), increased from 2.3 x 1.2 cm. No definite pancreatic duct dilation. Severe wall thickening throughout the adjacent descending duodenum, worsened. Fluid collection inferior to the gallbladder fossa measures 5.0 x 3.0 cm (series 3/image 71) and demonstrates layering hyperdense material, new. Spleen: Normal size. No mass. Adrenals/Urinary Tract: Normal adrenals. No hydronephrosis. No renal stones. Partially exophytic simple 1.3 cm posterior interpolar left renal cyst. No additional contour deforming renal lesions. Normal bladder. Stomach/Bowel: Normal non-distended stomach. Normal caliber small bowel with no small bowel wall thickening. Appendectomy. New segmental wall thickening in the hepatic flexure of the colon. No  additional sites of large bowel wall thickening. Stable postsurgical changes from distal large bowel resection. Vascular/Lymphatic: Atherosclerotic abdominal aorta with stable 5.0 cm infrarenal abdominal aortic aneurysm status post aorto bi-iliac stent graft repair. Surgical clips are noted in the right inguinal region. No pathologically enlarged lymph nodes in the abdomen or pelvis. Reproductive: Status post hysterectomy, with no abnormal findings at the vaginal cuff. No adnexal mass. Other: No pneumoperitoneum.  New small volume perihepatic ascites. Musculoskeletal: No aggressive appearing focal osseous lesions. Mild lumbar spondylosis. IMPRESSION: CT CHEST: 1. No lung masses or significant pulmonary nodules. 2. Borderline mild cardiomegaly. Diffuse interlobular septal thickening suggests mild pulmonary edema. No pleural effusions. 3. Dilated main pulmonary artery, suggesting pulmonary arterial hypertension. CT ABDOMEN PELVIS: 1. Worsening severe inflammatory changes surrounding the pancreatic head and neck, descending duodenum and hepatic flexure of the colon. Enlarging nonspecific complex poorly defined fluid collection in the anterior pancreatic head. New complex fluid collection inferior to the gallbladder fossa with layering hyperdense material. Burtis Junes worsening pancreatitis with enlarging pancreatic pseudocysts. Marked wall thickening in the descending duodenum has worsened, and could be reactive, with peptic ulcer disease difficult to exclude. No free air. Short-term follow-up MRI or CT abdomen with IV contrast suggested. 2. New wall thickening throughout the hepatic flexure of the colon, probably reactive. 3. Cholecystectomy. No intrahepatic biliary ductal dilatation. CBD diameter 7 mm, within normal post cholecystectomy limits. 4. New small volume perihepatic ascites. 5.  Aortic Atherosclerosis (ICD10-I70.0). Electronically Signed   By: Ilona Sorrel M.D.   On: 05/28/2018 20:41   Dg Chest Portable 1  View  Result Date: 05/28/2018 CLINICAL DATA:  Severe right lower quadrant pain. EXAM: PORTABLE CHEST 1 VIEW COMPARISON:  04/18/2018 FINDINGS: The lungs are clear without focal pneumonia, edema, pneumothorax or pleural effusion. Interstitial markings are diffusely coarsened with chronic features. Tiny nodular density identified right upper lung, superimposed on anterior right second rib. Patient is status post median sternotomy. Left atrial appendage occluded device noted. Vascular stent superimposed on the transverse aorta. The visualized bony structures of the thorax are intact. IMPRESSION: Cardiomegaly with mild vascular congestion or chronic interstitial coarsening. Tiny nodule right upper lung not visible on prior studies and indeterminate. CT chest without contrast could be used to further evaluate. Electronically Signed   By: Misty Stanley M.D.   On: 05/28/2018 17:23   Dg Abd 2 Views  Result Date: 05/30/2018 CLINICAL DATA:  History of pancreatitis.  Abdominal pain. EXAM: ABDOMEN - 2 VIEW COMPARISON:  Chest radiograph 05/29/2018 FINDINGS: Feeding tube tip projects at the gastric antrum/proximal duodenum, unchanged. Aortic stent graft material. Monitoring leads overlie the patient. Gas is demonstrated within nondilated loops of large and small bowel. Small left pleural effusion. Heterogeneous opacities left lung base. IMPRESSION: Feeding tube projects at the gastric antrum/proximal duodenum, unchanged. Decreased gaseous distention of the stomach and small bowel. Electronically Signed   By: Polly Cobia.D.  On: 05/30/2018 10:49   Dg Abd Portable 1v  Result Date: 05/29/2018 CLINICAL DATA:  Feeding tube placement EXAM: PORTABLE ABDOMEN - 1 VIEW COMPARISON:  None. FINDINGS: Gaseous distension of the stomach and proximal small bowel. Feeding tube with the tip projecting over antrum versus proximal duodenum. No evidence of pneumoperitoneum, portal venous gas or pneumatosis. No pathologic calcifications  along the expected course of the ureters. Aorto bi-iliac stent graft is noted. Left renal artery stent is noted. Osseous structures are unremarkable. IMPRESSION: Gaseous distension of the stomach and proximal small bowel. Feeding tube with the tip projecting over antrum versus proximal duodenum. Electronically Signed   By: Kathreen Devoid   On: 05/29/2018 15:30    Lab Data:  CBC: Recent Labs  Lab 05/29/18 1528 05/30/18 0236 05/31/18 0522 06/01/18 0450 06/02/18 0608 06/03/18 0526  WBC 14.1* 14.4* 8.2 9.6 12.7* 13.7*  NEUTROABS 12.9*  --   --   --   --   --   HGB 8.2* 8.4* 7.7* 6.7* 8.8* 8.4*  HCT 26.7* 27.1* 24.7*  24.3* 21.0* 27.6* 25.9*  MCV 100.4* 100.0 98.8 98.1 96.5 96.3  PLT 218 220 207 178 202 161   Basic Metabolic Panel: Recent Labs  Lab 05/29/18 1528 05/30/18 0236 05/31/18 0522 06/01/18 0450 06/01/18 1841 06/02/18 0608 06/03/18 0526  NA 141 140 140 137  --  137 135  K 3.7 4.1 3.1* 2.8* 3.8 4.3 4.2  CL 100 100 99 99  --  99 95*  CO2 25 24 30 29   --  28 29  GLUCOSE 85 149* 173* 184*  --  119* 142*  BUN 32* 35* 34* 33*  --  36* 42*  CREATININE 1.98* 2.27* 1.94* 1.76*  --  1.66* 1.74*  CALCIUM 7.8* 7.7* 7.8* 7.2*  --  7.7* 7.7*  MG 2.1 2.1  --  1.6*  --  2.4 2.0  PHOS  --   --   --  1.4*  --  2.7 4.1   GFR: Estimated Creatinine Clearance: 28.8 mL/min (A) (by C-G formula based on SCr of 1.74 mg/dL (H)). Liver Function Tests: Recent Labs  Lab 05/30/18 0236 05/31/18 0522 06/01/18 0450 06/02/18 0608 06/03/18 0526  AST 17 15 11* 16 19  ALT 8 7 5 6 8   ALKPHOS 36* 34* 36* 41 41  BILITOT 1.8* 0.8 0.5 0.6 0.5  PROT 4.9* 4.8* 4.4* 4.8* 5.1*  ALBUMIN 1.9* 1.7* 1.5* 1.7* 1.7*   Recent Labs  Lab 05/30/18 0236 05/31/18 0522 06/01/18 0450 06/02/18 0608 06/03/18 0526  LIPASE 270* 162* 121* 106* 103*   No results for input(s): AMMONIA in the last 168 hours. Coagulation Profile: Recent Labs  Lab 05/28/18 2325  INR 1.13   Cardiac Enzymes: Recent Labs  Lab  05/29/18 0335 05/29/18 0954 05/29/18 1528 05/29/18 2013 05/30/18 0236  TROPONINI 0.35* 0.52* 0.44* 0.34* 0.31*   BNP (last 3 results) No results for input(s): PROBNP in the last 8760 hours. HbA1C: No results for input(s): HGBA1C in the last 72 hours. CBG: Recent Labs  Lab 06/02/18 2009 06/02/18 2335 06/03/18 0214 06/03/18 0534 06/03/18 1042  GLUCAP 157* 188* 205* 143* 169*   Lipid Profile: No results for input(s): CHOL, HDL, LDLCALC, TRIG, CHOLHDL, LDLDIRECT in the last 72 hours. Thyroid Function Tests: No results for input(s): TSH, T4TOTAL, FREET4, T3FREE, THYROIDAB in the last 72 hours. Anemia Panel: No results for input(s): VITAMINB12, FOLATE, FERRITIN, TIBC, IRON, RETICCTPCT in the last 72 hours. Urine analysis:    Component Value  Date/Time   COLORURINE YELLOW 05/28/2018 0506   APPEARANCEUR HAZY (A) 05/28/2018 0506   LABSPEC 1.021 05/28/2018 0506   PHURINE 5.0 05/28/2018 0506   GLUCOSEU NEGATIVE 05/28/2018 0506   HGBUR NEGATIVE 05/28/2018 0506   BILIRUBINUR NEGATIVE 05/28/2018 0506   KETONESUR 5 (A) 05/28/2018 0506   PROTEINUR NEGATIVE 05/28/2018 0506   UROBILINOGEN 0.2 03/04/2011 2107   NITRITE NEGATIVE 05/28/2018 0506   LEUKOCYTESUR SMALL (A) 05/28/2018 0506     Rakeisha Nyce M.D. [facility] 06/03/2018, 1:04 PM  Pager: [phone] Between 7am to 7pm - call Pager - [phone]  After 7pm go to [email or web address] - password [email or web address]  Call night coverage person covering after 7pm

## 2018-06-03 NOTE — Progress Notes (Addendum)
CROSS [facility]OVER [facility] Subjective: [name] is a 67 year old white female who was recently discharged from the hospital after having a laparoscopic cholecystectomy with an [place] [done on 04/19/2018] for cholelithiasis and gallbladder sludge. She was hospitalized from 04/09/2018 TO 04/21/2108. She presented to the ER with recurrent abdominal pain in the periumbilical and epigastric region with nausea and vomiting. A CT scan without contrast done on 05/26/2018 revealed worsening severe inflammatory changes in the pancreatic head neck descending duodenum and hepatic flexure. Marked wall thickening of the descending duodenum could be from peptic ulcer disease. She's been tolerating tube feeds well. Repeat imaging and has not been done because of her renal compromise renal function. Her diarrhea somewhat improved. Hemoglobin is 8.4 g/dL today slightly down from 8.8 g/dL yesterday. Lipase is slightly elevated 103 today  Objective: Vital signs in last 24 hours: Temp:  [97.6 F (36.4 C)-98.7 F (37.1 C)] 98.4 F (36.9 C) (09/28 0411) Pulse Rate:  [68-72] 68 (09/28 0929) Resp:  [14-18] 18 (09/28 0929) BP: (135-150)/(47-66) 150/63 (09/28 0929) SpO2:  [94 %-98 %] 98 % (09/28 0929) Last BM Date: 06/02/18  Intake/Output from previous day: 09/27 0701 - 09/28 0700 In: 3094.5 [P.O.:400; I.V.:1253.7; NG/GT:1290.8; IV Piggyback:150] Out: 403 [Urine:400; Stool:3] Intake/Output this shift: No intake/output data recorded.  General appearance: alert, cooperative, fatigued and mild distress Resp: clear to auscultation bilaterally Cardio: regular rate and rhythm, S1, S2 normal, no murmur, click, rub or gallop GI: soft, slightly distended with periumbilical tenderness on palpation with gaurding  with hypoactive bowel sounds normal; no masses,  no organomegaly Extremities: extremities normal, atraumatic, no cyanosis or edema  Lab Results: Recent Labs    06/01/18 0450 06/02/18 0608 06/03/18 0526  WBC 9.6 12.7*  13.7*  HGB 6.7* 8.8* 8.4*  HCT 21.0* 27.6* 25.9*  PLT 178 202 236   BMET Recent Labs    06/01/18 0450 06/01/18 1841 06/02/18 0608 06/03/18 0526  NA 137  --  137 135  K 2.8* 3.8 4.3 4.2  CL 99  --  99 95*  CO2 29  --  28 29  GLUCOSE 184*  --  119* 142*  BUN 33*  --  36* 42*  CREATININE 1.76*  --  1.66* 1.74*  CALCIUM 7.2*  --  7.7* 7.7*   LFT Recent Labs    06/03/18 0526  PROT 5.1*  ALBUMIN 1.7*  AST 19  ALT 8  ALKPHOS 41  BILITOT 0.5   Medications: I have reviewed the patient's current medications.  Assessment/Plan: 1) Abdominal pain with nausea and vomiting in the setting of an enlarging pancreatic pseudocyst. On IV Zosyn and tube feeds. Will need repeat imaging to evaluate extent of disease.   2) Severe protein calorie malnutrition on TF's. 3) Chronic kidney disease-Stage IV--not on hemodialysis at this time; AV fistula placed 2 2019. 4) Chronic anemia with a history of GI bleed. 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 Her colonoscopy was planned next month. 5) CAD s/p CABG/MVR/PAD/Chronic diastolic CHF/chest pain most consistent most with demand ischemia. Outpatient follow-up and workup planned.  6) History of tobacco abuse. 7) History of a AAA repair complicated by retroperitoneal bleed-01/2018.   LOS: 6 days   [name] 06/03/2018, 10:41 AM

## 2018-06-04 ENCOUNTER — Encounter (HOSPITAL_COMMUNITY): Payer: Self-pay

## 2018-06-04 DIAGNOSIS — I4891 Unspecified atrial fibrillation: Secondary | ICD-10-CM

## 2018-06-04 LAB — CBC
HCT: 26 % — ABNORMAL LOW (ref 36.0–46.0)
HEMOGLOBIN: 8.1 g/dL — AB (ref 12.0–15.0)
MCH: 30.8 pg (ref 26.0–34.0)
MCHC: 31.2 g/dL (ref 30.0–36.0)
MCV: 98.9 fL (ref 78.0–100.0)
Platelets: 241 10*3/uL (ref 150–400)
RBC: 2.63 MIL/uL — ABNORMAL LOW (ref 3.87–5.11)
RDW: 16.7 % — ABNORMAL HIGH (ref 11.5–15.5)
WBC: 13.3 10*3/uL — ABNORMAL HIGH (ref 4.0–10.5)

## 2018-06-04 LAB — GLUCOSE, CAPILLARY
GLUCOSE-CAPILLARY: 127 mg/dL — AB (ref 70–99)
GLUCOSE-CAPILLARY: 149 mg/dL — AB (ref 70–99)
GLUCOSE-CAPILLARY: 159 mg/dL — AB (ref 70–99)
Glucose-Capillary: 151 mg/dL — ABNORMAL HIGH (ref 70–99)
Glucose-Capillary: 204 mg/dL — ABNORMAL HIGH (ref 70–99)
Glucose-Capillary: 162 mg/dL — ABNORMAL HIGH (ref 70–99)

## 2018-06-04 LAB — PHOSPHORUS: PHOSPHORUS: 5.5 mg/dL — AB (ref 2.5–4.6)

## 2018-06-04 LAB — COMPREHENSIVE METABOLIC PANEL
ALT: 8 U/L (ref 0–44)
ANION GAP: 11 (ref 5–15)
AST: 16 U/L (ref 15–41)
Albumin: 1.6 g/dL — ABNORMAL LOW (ref 3.5–5.0)
Alkaline Phosphatase: 36 U/L — ABNORMAL LOW (ref 38–126)
BILIRUBIN TOTAL: 0.6 mg/dL (ref 0.3–1.2)
BUN: 45 mg/dL — AB (ref 8–23)
CHLORIDE: 94 mmol/L — AB (ref 98–111)
CO2: 30 mmol/L (ref 22–32)
Calcium: 8 mg/dL — ABNORMAL LOW (ref 8.9–10.3)
Creatinine, Ser: 1.85 mg/dL — ABNORMAL HIGH (ref 0.44–1.00)
GFR, EST AFRICAN AMERICAN: 32 mL/min — AB (ref >60)
GFR, EST NON AFRICAN AMERICAN: 27 mL/min — AB (ref >60)
Glucose, Bld: 137 mg/dL — ABNORMAL HIGH (ref 70–99)
POTASSIUM: 4.8 mmol/L (ref 3.5–5.1)
Sodium: 135 mmol/L (ref 135–145)
TOTAL PROTEIN: 4.6 g/dL — AB (ref 6.5–8.1)

## 2018-06-04 LAB — MAGNESIUM: MAGNESIUM: 2 mg/dL (ref 1.7–2.4)

## 2018-06-04 LAB — LIPASE, BLOOD: Lipase: 93 U/L — ABNORMAL HIGH (ref 11–51)

## 2018-06-04 MED ORDER — FUROSEMIDE 10 MG/ML IJ SOLN
20.0000 mg | Freq: Once | INTRAMUSCULAR | Status: AC
  Administered 2018-06-04: 20 mg via INTRAVENOUS
  Filled 2018-06-04: qty 2

## 2018-06-04 NOTE — Progress Notes (Signed)
[facility]                                                                              Patient Demographics  [name], is a 67 y.o. female, DOB - [birth year], MBW:[id number]  Admit date - 05/28/2018   Admitting Physician Ivor Costa, MD  Outpatient Primary MD for the patient is Bernerd Limbo, MD  Outpatient specialists:   LOS - 7  days   Medical records reviewed and are as summarized below:    Chief Complaint  Patient presents with  . Abdominal Pain  . Emesis       Brief summary   Patient is a 67 year old female with hypertension, hyperlipidemia, diabetesCOPD on home oxygen, GERD, gout, depression with anxiety, tobacco abuse, atrial fibrillation not on anticoagulants, AAA, CAD, CABG, CHF, CKD-4, GI bleeding, mitral valve regurgitation, PAD who presented with abdominal pain. Patient was recently hospitalized from 8/4-8/16 due to GI bleeding required blood transfusion and gallstone pancreatitis.  Patient underwent laparoscopic cholecystectomy.  She was discharged in stable condition.  Patient presented back with nausea vomiting abdominal pain, poor oral intake.    Assessment & Plan    Principal Problem:   Pancreatitis, recurrent with abdominal pain nausea and vomiting with pseudocyst -Status post cholecystectomy last month on 8/14 -GI following closely, CT abdomen pelvis showed worsening severe inflammatory changes surrounding the pancreatic head and neck, descending duodenum, hepatic flexure of colon.  New complex fluid collection inferior to the gallbladder fossa with layering hyperdense material, favor worsening pancreatitis with enlarging pancreatic pseudocyst. -Continue IV fluids, empiric Zosyn, pain control, feeding via core track  GI following  Active Problems: Severe protein calorie malnutrition in the context of #1, chronic illness, diarrhea Patient diarrhea improving, 1 BM at night Appreciate dietitian recommendations   Hypoalbuminemia with  bilateral upper extremity swelling nightly third spacing Albumin 1.6 Recommended to elevate both arms, bilateral Doppler ultrasound upper extremities to rule out DVT,  Lasix 20 mg IV x1   Hypokalemia, hypomagnesemia, hypophosphatemia -Potassium phosphorus magnesium improving -DC Neutra-Phos, phosphorus 5.5  Chronic kidney disease stage IV - Not on HD, AVF placement in 10/2017, aVF stenosis followed by vascular outpatient  GI bleed with acute on chronic anemia, CKD  critical illness Hemoglobin baseline around 8 -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  Received 1 unit packed RBCs on 9/26   Chest pain with elevated troponins, acute on chronic diastolic CHF Troponins trending down, peaked to 0.5, more consistent with demand ischemia.  Cardiology following recommended outpatient stress test, not a candidate for any invasive cardiac catheterization at this point -2D echo showed normal LV function, moderate mitral stenosis, mild MR -Cardiology follow-up  History of AAA repair in 0/1779 complicated with retroperitoneal bleed  COPD, tobacco use -Currently no wheezing, continue nicotine patch  Code Status: full  DVT Prophylaxis:  SCD's Family Communication: Discussed in detail with the patient, all imaging results, lab results explained to the patient   Disposition Plan: Not medically ready, needs repeat CT abdomen imaging for further work-up  Time Spent in minutes 25 minutes  Procedures:  CT abdomen 2D echo  Consultants:   GI Cardiology  Antimicrobials:      Medications  Scheduled Meds: . allopurinol  100 mg Oral Daily  . ALPRAZolam  0.5 mg Oral QHS  . amLODipine  10 mg Oral Daily  . atorvastatin  40 mg Oral QPM  . carvedilol  25 mg Oral BID WC  . cholecalciferol  5,000 Units Oral Daily  . cloNIDine  0.2 mg Oral TID  . clopidogrel  75 mg Oral Q breakfast  . DULoxetine  60 mg Oral  Daily  . famotidine  20 mg Oral QAC supper  . fenofibrate  160 mg Oral QHS  . furosemide  20 mg Intravenous Once  . gabapentin  300 mg Oral BID  . heparin  5,000 Units Subcutaneous Q8H  . hydrALAZINE  100 mg Oral TID  . insulin aspart  0-9 Units Subcutaneous Q4H  . isosorbide mononitrate  15 mg Oral Daily  . lipase/protease/amylase  12,000 Units Oral TID AC  . mirtazapine  7.5 mg Oral QHS  . mometasone-formoterol  2 puff Inhalation BID  . nicotine  21 mg Transdermal Daily  . pantoprazole sodium  40 mg Per Tube Daily  . phosphorus  250 mg Oral Daily   Continuous Infusions: . sodium chloride Stopped (06/04/18 0016)  . feeding supplement (OSMOLITE 1.2 CAL) 1,000 mL (06/03/18 1133)  . lactated ringers 50 mL/hr at 06/04/18 0600  . piperacillin-tazobactam (ZOSYN)  IV 3.375 g (06/04/18 0940)   PRN Meds:.sodium chloride, acetaminophen **OR** acetaminophen, alum & mag hydroxide-simeth, hydrALAZINE, HYDROcodone-acetaminophen, HYDROmorphone (DILAUDID) injection, hydrOXYzine, levalbuterol, loperamide, ondansetron (ZOFRAN) IV, polyethylene glycol, zolpidem   Antibiotics   Anti-infectives (From admission, onward)   Start     Dose/Rate Route Frequency Ordered Stop   05/29/18 0600  piperacillin-tazobactam (ZOSYN) IVPB 3.375 g     3.375 g 12.5 mL/hr over 240 Minutes Intravenous Every 8 hours 05/28/18 2227     05/28/18 2230  piperacillin-tazobactam (ZOSYN) IVPB 3.375 g     3.375 g 100 mL/hr over 30 Minutes Intravenous  Once 05/28/18 2227 05/29/18 0011        Subjective:   [name] was seen and examined today.  No acute issues, no nausea, vomiting, abdominal pain.  Weak and frail.  No fevers. Objective:   Vitals:   06/03/18 2104 06/04/18 0508 06/04/18 0746 06/04/18 0956  BP: (!) 132/56 (!) 140/50  (!) 150/49  Pulse: 62 62  66  Resp: 18 16  18   Temp: 98.2 F (36.8 C) 98.3 F (36.8 C)  (!) 97.5 F (36.4 C)  TempSrc: Oral Oral  Oral  SpO2: 98% 98% 97% 98%  Weight:      Height:         Intake/Output Summary (Last 24 hours) at 06/04/2018 1421 Last data filed at 06/04/2018 1000 Gross per 24 hour  Intake 1684.85 ml  Output 0 ml  Net 1684.85 ml     Wt Readings from Last 3 Encounters:  05/30/18 56.6 kg  05/15/18 55.8 kg  04/28/18 60.3 kg     Exam    General: Alert and oriented x 3, NAD, ill-appearing  Eyes:   HEENT:  Atraumatic, normocephalic  Cardiovascular: S1 S2 auscultated, RRR   Respiratory: Clear to auscultation bilaterally, no wheezing, rales or rhonchi  Gastrointestinal: Soft, mild diffuse tenderness , nondistended, + bowel sounds  Ext: bilateral arms swelling with oozing on the right  Neuro: no FND  Musculoskeletal: No digital cyanosis, clubbing  Skin: No rashes  Psych: Normal affect and demeanor, alert and oriented x3      Data Reviewed:  I have personally reviewed following labs and imaging studies  Micro Results Recent Results (from the past 240 hour(s))  Blood culture (routine x 2)     Status: None   Collection Time: 05/28/18  5:54 PM  Result Value Ref Range Status   Specimen Description BLOOD RIGHT ANTECUBITAL  Final   Special Requests   Final    BOTTLES DRAWN AEROBIC AND ANAEROBIC Blood Culture results may not be optimal due to an excessive volume of blood received in culture bottles   Culture   Final    NO GROWTH 5 DAYS Performed at [facility], [place] [address], [place], [place] [address]    Report Status 06/02/2018 FINAL  Final  Blood culture (routine x 2)     Status: None   Collection Time: 05/28/18  6:35 PM  Result Value Ref Range Status   Specimen Description BLOOD BLOOD RIGHT FOREARM  Final   Special Requests   Final    BOTTLES DRAWN AEROBIC ONLY Blood Culture results may not be optimal due to an inadequate volume of blood received in culture bottles   Culture   Final    NO GROWTH 5 DAYS Performed at [facility], [place] [address] [place], [place] [address]    Report Status 06/02/2018 FINAL   Final  MRSA PCR Screening     Status: None   Collection Time: 05/30/18  2:53 PM  Result Value Ref Range Status   MRSA by PCR NEGATIVE NEGATIVE Final    Comment:        The GeneXpert MRSA Assay (FDA approved for NASAL specimens only), is one component of a comprehensive MRSA colonization surveillance program. It is not intended to diagnose MRSA infection nor to guide or monitor treatment for MRSA infections. Performed at [facility], [place] [address] [place], [place] [address]   C difficile quick scan w PCR reflex     Status: None   Collection Time: 05/31/18  9:11 AM  Result Value Ref Range Status   C Diff antigen NEGATIVE NEGATIVE Final   C Diff toxin NEGATIVE NEGATIVE Final   C Diff interpretation No C. difficile detected.  Final    Comment: Performed at [facility], [place] [address], [place], [place] [address]    Radiology Reports Ct Abdomen Pelvis Wo Contrast  Result Date: 05/28/2018 CLINICAL DATA:  New tiny right lung nodule on chest radiograph. Severe right lower quadrant abdominal pain with nausea, vomiting and diarrhea. Chronic kidney disease. Prior cholecystectomy 04/19/2018, hysterectomy and appendectomy. Prior bowel resection. EXAM: CT CHEST, ABDOMEN AND PELVIS WITHOUT CONTRAST TECHNIQUE: Multidetector CT imaging of the chest, abdomen and pelvis was performed following the standard protocol without IV contrast. COMPARISON:  Chest radiograph from earlier today. FINDINGS: CT CHEST FINDINGS Cardiovascular: Borderline mild cardiomegaly. No significant pericardial effusion/thickening. 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 Atherosclerotic nonaneurysmal thoracic aorta. Proximal left subclavian artery stent is noted. Dilated main pulmonary artery (4.0 cm diameter). Mediastinum/Nodes: No discrete thyroid nodules. Unremarkable esophagus. No axillary adenopathy. Mild right paratracheal adenopathy up to 1.1 cm (series 3/image 21). Mildly enlarged 1.1  cm subcarinal node (series 3/image 26). No discrete hilar adenopathy on this noncontrast scan. Lungs/Pleura: No pneumothorax. No pleural effusion. A few tiny calcified granulomas are present in the upper lobes bilaterally. No acute consolidative airspace disease, lung masses or significant pulmonary nodules. Diffuse  interlobular septal thickening. Musculoskeletal: No aggressive appearing focal osseous lesions. Intact sternotomy wires. Moderate thoracic spondylosis. CT ABDOMEN PELVIS FINDINGS Hepatobiliary: Normal liver with no liver mass. Cholecystectomy. Bile ducts are within normal post cholecystectomy limits with CBD diameter 7 mm. Pancreas: Thickening of the pancreatic head and neck. Prominent peripancreatic fat stranding and ill-defined fluid in the pancreatic head and neck, worsened. Poorly defined 5.1 x 2.5 cm anterior pancreatic head complex fluid collection with heterogeneous internal density (series 3/image 66), increased from 2.3 x 1.2 cm. No definite pancreatic duct dilation. Severe wall thickening throughout the adjacent descending duodenum, worsened. Fluid collection inferior to the gallbladder fossa measures 5.0 x 3.0 cm (series 3/image 71) and demonstrates layering hyperdense material, new. Spleen: Normal size. No mass. Adrenals/Urinary Tract: Normal adrenals. No hydronephrosis. No renal stones. Partially exophytic simple 1.3 cm posterior interpolar left renal cyst. No additional contour deforming renal lesions. Normal bladder. Stomach/Bowel: Normal non-distended stomach. Normal caliber small bowel with no small bowel wall thickening. Appendectomy. New segmental wall thickening in the hepatic flexure of the colon. No additional sites of large bowel wall thickening. Stable postsurgical changes from distal large bowel resection. Vascular/Lymphatic: Atherosclerotic abdominal aorta with stable 5.0 cm infrarenal abdominal aortic aneurysm status post aorto bi-iliac stent graft repair. Surgical clips are  noted in the right inguinal region. No pathologically enlarged lymph nodes in the abdomen or pelvis. Reproductive: Status post hysterectomy, with no abnormal findings at the vaginal cuff. No adnexal mass. Other: No pneumoperitoneum.  New small volume perihepatic ascites. Musculoskeletal: No aggressive appearing focal osseous lesions. Mild lumbar spondylosis. IMPRESSION: CT CHEST: 1. No lung masses or significant pulmonary nodules. 2. Borderline mild cardiomegaly. Diffuse interlobular septal thickening suggests mild pulmonary edema. No pleural effusions. 3. Dilated main pulmonary artery, suggesting pulmonary arterial hypertension. CT ABDOMEN PELVIS: 1. Worsening severe inflammatory changes surrounding the pancreatic head and neck, descending duodenum and hepatic flexure of the colon. Enlarging nonspecific complex poorly defined fluid collection in the anterior pancreatic head. New complex fluid collection inferior to the gallbladder fossa with layering hyperdense material. Burtis Junes worsening pancreatitis with enlarging pancreatic pseudocysts. Marked wall thickening in the descending duodenum has worsened, and could be reactive, with peptic ulcer disease difficult to exclude. No free air. Short-term follow-up MRI or CT abdomen with IV contrast suggested. 2. New wall thickening throughout the hepatic flexure of the colon, probably reactive. 3. Cholecystectomy. No intrahepatic biliary ductal dilatation. CBD diameter 7 mm, within normal post cholecystectomy limits. 4. New small volume perihepatic ascites. 5.  Aortic Atherosclerosis (ICD10-I70.0). Electronically Signed   By: Ilona Sorrel M.D.   On: 05/28/2018 20:41   Ct Abdomen Pelvis Wo Contrast  Result Date: 05/16/2018 CLINICAL DATA:  Acute abdominal pain.  Cholecystectomy last month. EXAM: CT ABDOMEN AND PELVIS WITHOUT CONTRAST TECHNIQUE: Multidetector CT imaging of the abdomen and pelvis was performed following the standard protocol without IV contrast. COMPARISON:   Multiple prior exams most recent CT 04/18/2018 FINDINGS: Lower chest: Improved pleural effusions with small right greater than left residual. Mild septal thickening at the lung bases. Coronary artery calcifications. Mild cardiomegaly. Hepatobiliary: No focal hepatic lesion on noncontrast exam. Postcholecystectomy with clips in the gallbladder fossa. Small hyperdensity in the gallbladder fossa may be cystic duct remnant with small stone. No biliary dilatation. Pancreas: Similar appearance of peripancreatic fat stranding with stranding involving the region of the duodenum. Previously seen fluid collections in the pancreatic head are not definitively visualized, lack of IV contrast limits detailed assessment. No evidence of progressive fluid collection. No  ductal dilatation. Spleen: Normal in size. Adrenals/Urinary Tract: Mild left adrenal thickening without dominant nodule. Right adrenal gland is normal. Calcifications of both renal hila may be vascular or nonobstructing stones, grossly unchanged from prior exam. No hydronephrosis. Small low-density lesion arising from the left kidney, likely cysts. Urinary bladder is nondistended and not well evaluated. Stomach/Bowel: Again seen wall thickening about the distal stomach and duodenum, not well assessed in the absence of contrast. Degree of stranding has slightly increased from prior exam. No other small or large bowel inflammation or wall thickening. Enteric sutures in the sigmoid colon. Prior appendectomy. Vascular/Lymphatic: Aorto bi-iliac stent graft. Residual aneurysmal diameter of 5.1 cm, unchanged. Stent at the origin of both renal arteries. Aortic and peripheral atherosclerosis. Diffuse atherosclerosis of external iliac arteries. Reproductive: Status post hysterectomy. No adnexal masses. Other: Fluid collection in the left retroperitoneum inferior to the left kidney and lateral to the psoas muscle currently measures 4.5 x 3.4 cm, previously 5.5 x 4 cm. No  internal air. Upper abdominal stranding and edema. No frank ascites. No free air. Musculoskeletal: There are no acute or suspicious osseous abnormalities. IMPRESSION: 1. Persistent fat stranding and poorly defined fat planes in the region of the pancreatic head and duodenum. Favor persistent or recurrent pancreatitis over duodenitis. Previous small fluid collections in the region of the pancreatic head are not currently visualized and may have resolved, lack of contrast limits assessment. If the patient can tolerate breath hold technique, pancreatic protocol MRI may be of value to further assess. 2. Interval cholecystectomy. Small hyperdensity in the gallbladder fossa may be gallstone within the cystic duct remnant, no evidence of inflammatory change. 3. Decreasing size of left retroperitoneal hematoma. 4. Decreased bilateral pleural effusions from prior exam with mild residual. Septal thickening in the lung bases suspicious for pulmonary edema. 5. Aorto bi-iliac stent graft repair of abdominal aortic aneurysm with unchanged residual aortic diameter. Diffuse aortic and branch atherosclerosis. Aortic Atherosclerosis (ICD10-I70.0). 6. Electronically Signed   By: Keith Rake M.D.   On: 05/16/2018 07:07   Ct Chest Wo Contrast  Result Date: 05/28/2018 CLINICAL DATA:  New tiny right lung nodule on chest radiograph. Severe right lower quadrant abdominal pain with nausea, vomiting and diarrhea. Chronic kidney disease. Prior cholecystectomy 04/19/2018, hysterectomy and appendectomy. Prior bowel resection. EXAM: CT CHEST, ABDOMEN AND PELVIS WITHOUT CONTRAST TECHNIQUE: Multidetector CT imaging of the chest, abdomen and pelvis was performed following the standard protocol without IV contrast. COMPARISON:  Chest radiograph from earlier today. FINDINGS: CT CHEST FINDINGS Cardiovascular: Borderline mild cardiomegaly. No significant pericardial effusion/thickening. Left main and 3 vessel coronary atherosclerosis status  post CABG. Atherosclerotic nonaneurysmal thoracic aorta. Proximal left subclavian artery stent is noted. Dilated main pulmonary artery (4.0 cm diameter). Mediastinum/Nodes: No discrete thyroid nodules. Unremarkable esophagus. No axillary adenopathy. Mild right paratracheal adenopathy up to 1.1 cm (series 3/image 21). Mildly enlarged 1.1 cm subcarinal node (series 3/image 26). No discrete hilar adenopathy on this noncontrast scan. Lungs/Pleura: No pneumothorax. No pleural effusion. A few tiny calcified granulomas are present in the upper lobes bilaterally. No acute consolidative airspace disease, lung masses or significant pulmonary nodules. Diffuse interlobular septal thickening. Musculoskeletal: No aggressive appearing focal osseous lesions. Intact sternotomy wires. Moderate thoracic spondylosis. CT ABDOMEN PELVIS FINDINGS Hepatobiliary: Normal liver with no liver mass. Cholecystectomy. Bile ducts are within normal post cholecystectomy limits with CBD diameter 7 mm. Pancreas: Thickening of the pancreatic head and neck. Prominent peripancreatic fat stranding and ill-defined fluid in the pancreatic head and neck, worsened. Poorly defined  5.1 x 2.5 cm anterior pancreatic head complex fluid collection with heterogeneous internal density (series 3/image 66), increased from 2.3 x 1.2 cm. No definite pancreatic duct dilation. Severe wall thickening throughout the adjacent descending duodenum, worsened. Fluid collection inferior to the gallbladder fossa measures 5.0 x 3.0 cm (series 3/image 71) and demonstrates layering hyperdense material, new. Spleen: Normal size. No mass. Adrenals/Urinary Tract: Normal adrenals. No hydronephrosis. No renal stones. Partially exophytic simple 1.3 cm posterior interpolar left renal cyst. No additional contour deforming renal lesions. Normal bladder. Stomach/Bowel: Normal non-distended stomach. Normal caliber small bowel with no small bowel wall thickening. Appendectomy. New segmental wall  thickening in the hepatic flexure of the colon. No additional sites of large bowel wall thickening. Stable postsurgical changes from distal large bowel resection. Vascular/Lymphatic: Atherosclerotic abdominal aorta with stable 5.0 cm infrarenal abdominal aortic aneurysm status post aorto bi-iliac stent graft repair. Surgical clips are noted in the right inguinal region. No pathologically enlarged lymph nodes in the abdomen or pelvis. Reproductive: Status post hysterectomy, with no abnormal findings at the vaginal cuff. No adnexal mass. Other: No pneumoperitoneum.  New small volume perihepatic ascites. Musculoskeletal: No aggressive appearing focal osseous lesions. Mild lumbar spondylosis. IMPRESSION: CT CHEST: 1. No lung masses or significant pulmonary nodules. 2. Borderline mild cardiomegaly. Diffuse interlobular septal thickening suggests mild pulmonary edema. No pleural effusions. 3. Dilated main pulmonary artery, suggesting pulmonary arterial hypertension. CT ABDOMEN PELVIS: 1. Worsening severe inflammatory changes surrounding the pancreatic head and neck, descending duodenum and hepatic flexure of the colon. Enlarging nonspecific complex poorly defined fluid collection in the anterior pancreatic head. New complex fluid collection inferior to the gallbladder fossa with layering hyperdense material. Burtis Junes worsening pancreatitis with enlarging pancreatic pseudocysts. Marked wall thickening in the descending duodenum has worsened, and could be reactive, with peptic ulcer disease difficult to exclude. No free air. Short-term follow-up MRI or CT abdomen with IV contrast suggested. 2. New wall thickening throughout the hepatic flexure of the colon, probably reactive. 3. Cholecystectomy. No intrahepatic biliary ductal dilatation. CBD diameter 7 mm, within normal post cholecystectomy limits. 4. New small volume perihepatic ascites. 5.  Aortic Atherosclerosis (ICD10-I70.0). Electronically Signed   By: Ilona Sorrel M.D.    On: 05/28/2018 20:41   Dg Chest Portable 1 View  Result Date: 05/28/2018 CLINICAL DATA:  Severe right lower quadrant pain. EXAM: PORTABLE CHEST 1 VIEW COMPARISON:  04/18/2018 FINDINGS: The lungs are clear without focal pneumonia, edema, pneumothorax or pleural effusion. Interstitial markings are diffusely coarsened with chronic features. Tiny nodular density identified right upper lung, superimposed on anterior right second rib. Patient is status post median sternotomy. Left atrial appendage occluded device noted. Vascular stent superimposed on the transverse aorta. The visualized bony structures of the thorax are intact. IMPRESSION: Cardiomegaly with mild vascular congestion or chronic interstitial coarsening. Tiny nodule right upper lung not visible on prior studies and indeterminate. CT chest without contrast could be used to further evaluate. Electronically Signed   By: Misty Stanley M.D.   On: 05/28/2018 17:23   Dg Abd 2 Views  Result Date: 05/30/2018 CLINICAL DATA:  History of pancreatitis.  Abdominal pain. EXAM: ABDOMEN - 2 VIEW COMPARISON:  Chest radiograph 05/29/2018 FINDINGS: Feeding tube tip projects at the gastric antrum/proximal duodenum, unchanged. Aortic stent graft material. Monitoring leads overlie the patient. Gas is demonstrated within nondilated loops of large and small bowel. Small left pleural effusion. Heterogeneous opacities left lung base. IMPRESSION: Feeding tube projects at the gastric antrum/proximal duodenum, unchanged. Decreased gaseous distention of  the stomach and small bowel. Electronically Signed   By: Lovey Newcomer M.D.   On: 05/30/2018 10:49   Dg Abd Portable 1v  Result Date: 05/29/2018 CLINICAL DATA:  Feeding tube placement EXAM: PORTABLE ABDOMEN - 1 VIEW COMPARISON:  None. FINDINGS: Gaseous distension of the stomach and proximal small bowel. Feeding tube with the tip projecting over antrum versus proximal duodenum. No evidence of pneumoperitoneum, portal venous gas or  pneumatosis. No pathologic calcifications along the expected course of the ureters. Aorto bi-iliac stent graft is noted. Left renal artery stent is noted. Osseous structures are unremarkable. IMPRESSION: Gaseous distension of the stomach and proximal small bowel. Feeding tube with the tip projecting over antrum versus proximal duodenum. Electronically Signed   By: Kathreen Devoid   On: 05/29/2018 15:30    Lab Data:  CBC: Recent Labs  Lab 05/29/18 1528  05/31/18 0522 06/01/18 0450 06/02/18 1219 06/03/18 0526 06/04/18 0537  WBC 14.1*   < > 8.2 9.6 12.7* 13.7* 13.3*  NEUTROABS 12.9*  --   --   --   --   --   --   HGB 8.2*   < > 7.7* 6.7* 8.8* 8.4* 8.1*  HCT 26.7*   < > 24.7*  24.3* 21.0* 27.6* 25.9* 26.0*  MCV 100.4*   < > 98.8 98.1 96.5 96.3 98.9  PLT 218   < > 207 178 202 236 241   < > = values in this interval not displayed.   Basic Metabolic Panel: Recent Labs  Lab 05/30/18 0236 05/31/18 0522 06/01/18 0450 06/01/18 1841 06/02/18 0608 06/03/18 0526 06/04/18 0537  NA 140 140 137  --  137 135 135  K 4.1 3.1* 2.8* 3.8 4.3 4.2 4.8  CL 100 99 99  --  99 95* 94*  CO2 24 30 29   --  28 29 30   GLUCOSE 149* 173* 184*  --  119* 142* 137*  BUN 35* 34* 33*  --  36* 42* 45*  CREATININE 2.27* 1.94* 1.76*  --  1.66* 1.74* 1.85*  CALCIUM 7.7* 7.8* 7.2*  --  7.7* 7.7* 8.0*  MG 2.1  --  1.6*  --  2.4 2.0 2.0  PHOS  --   --  1.4*  --  2.7 4.1 5.5*   GFR: Estimated Creatinine Clearance: 27.1 mL/min (A) (by C-G formula based on SCr of 1.85 mg/dL (H)). Liver Function Tests: Recent Labs  Lab 05/31/18 0522 06/01/18 0450 06/02/18 0608 06/03/18 0526 06/04/18 0537  AST 15 11* 16 19 16   ALT 7 5 6 8 8   ALKPHOS 34* 36* 41 41 36*  BILITOT 0.8 0.5 0.6 0.5 0.6  PROT 4.8* 4.4* 4.8* 5.1* 4.6*  ALBUMIN 1.7* 1.5* 1.7* 1.7* 1.6*   Recent Labs  Lab 05/31/18 0522 06/01/18 0450 06/02/18 0608 06/03/18 0526 06/04/18 0537  LIPASE 162* 121* 106* 103* 93*   No results for input(s): AMMONIA in the  last 168 hours. Coagulation Profile: Recent Labs  Lab 05/28/18 2325  INR 1.13   Cardiac Enzymes: Recent Labs  Lab 05/29/18 0335 05/29/18 0954 05/29/18 1528 05/29/18 2013 05/30/18 0236  TROPONINI 0.35* 0.52* 0.44* 0.34* 0.31*   BNP (last 3 results) No results for input(s): PROBNP in the last 8760 hours. HbA1C: No results for input(s): HGBA1C in the last 72 hours. CBG: Recent Labs  Lab 06/03/18 2008 06/04/18 0021 06/04/18 0359 06/04/18 0801 06/04/18 1121  GLUCAP 165* 127* 149* 151* 159*   Lipid Profile: No results for input(s): CHOL, HDL, LDLCALC,  TRIG, CHOLHDL, LDLDIRECT in the last 72 hours. Thyroid Function Tests: No results for input(s): TSH, T4TOTAL, FREET4, T3FREE, THYROIDAB in the last 72 hours. Anemia Panel: No results for input(s): VITAMINB12, FOLATE, FERRITIN, TIBC, IRON, RETICCTPCT in the last 72 hours. Urine analysis:    Component Value Date/Time   COLORURINE YELLOW 05/28/2018 0506   APPEARANCEUR HAZY (A) 05/28/2018 0506   LABSPEC 1.021 05/28/2018 0506   PHURINE 5.0 05/28/2018 0506   GLUCOSEU NEGATIVE 05/28/2018 0506   HGBUR NEGATIVE 05/28/2018 0506   BILIRUBINUR NEGATIVE 05/28/2018 0506   KETONESUR 5 (A) 05/28/2018 0506   PROTEINUR NEGATIVE 05/28/2018 0506   UROBILINOGEN 0.2 03/04/2011 2107   NITRITE NEGATIVE 05/28/2018 0506   LEUKOCYTESUR SMALL (A) 05/28/2018 0506     Kyley Solow M.D. [facility] 06/04/2018, 2:21 PM  Pager: [phone] Between 7am to 7pm - call Pager - [phone]  After 7pm go to [email or web address] - password [email or web address]  Call night coverage person covering after 7pm

## 2018-06-04 NOTE — Progress Notes (Signed)
[facility]OSS [facility] Subjective: [name] is a 67 year old white female with multiple medical problems including hypertension hyperlipidemia diabetes COPD on home oxygen cord tobacco abuse atrial fibrillation CKD Stage IV, AAA s/p repair 01/2018, CAD, CABG, PAD, underwent a laparoscopic cholecystectomy on 08/14/209 and was readmitted with abdominal pain nausea and vomiting. A CT scan of the abdomen and pelvis showed worsening inflammatory changes around the head and neck of the pancreas along with the degree of the descending duodenum. Patient is receiving tube feeds she seems to be feeling much better today she denies having any nausea vomiting but has had some loose stools which she thinks is from the tube feeds and some abdominal pain that she rates a 6-7 out of 10.  Objective: Vital signs in last 24 hours: Temp:  [97.5 F (36.4 C)-98.3 F (36.8 C)] 97.5 F (36.4 C) (09/29 0956) Pulse Rate:  [62-71] 66 (09/29 0956) Resp:  [16-18] 18 (09/29 0956) BP: (132-150)/(49-64) 150/49 (09/29 0956) SpO2:  [97 %-98 %] 98 % (09/29 0956) Last BM Date: 06/03/18  Intake/Output from previous day: 09/28 0701 - 09/29 0700 In: 1924.9 [P.O.:540; I.V.:1284.9; IV Piggyback:100] Out: 0  Intake/Output this shift: Total I/O In: 120 [P.O.:120] Out: 0   General appearance: alert, cooperative, fatigued, no distress and pale Resp: clear to auscultation bilaterally Cardio: regular rate and rhythm, S1, S2 normal, no murmur, click, rub or gallop GI: soft, diffusely tender on palpation with gaurding; bowel sounds normal; no masses,  no organomegaly  Lab Results: Recent Labs    06/02/18 0608 06/03/18 0526 06/04/18 0537  WBC 12.7* 13.7* 13.3*  HGB 8.8* 8.4* 8.1*  HCT 27.6* 25.9* 26.0*  PLT 202 236 241   BMET Recent Labs    06/02/18 0608 06/03/18 0526 06/04/18 0537  NA 137 135 135  K 4.3 4.2 4.8  CL 99 95* 94*  CO2 28 29 30   GLUCOSE 119* 142* 137*  BUN 36* 42* 45*  CREATININE 1.66* 1.74* 1.85*   CALCIUM 7.7* 7.7* 8.0*   LFT Recent Labs    06/04/18 0537  PROT 4.6*  ALBUMIN 1.6*  AST 16  ALT 8  ALKPHOS 36*  BILITOT 0.6   Medications: I have reviewed the patient's current medications.  Assessment/Plan: 1) Recurrent acute pancreatitis with pancreatic pseudocyst-it has been difficult to evaluate the extent of her disease due to her chronic kidney disease and limiting options with advanced imaging. Continue present care. 2) Severe protein calorie malnutrition on tube feeds. 3) Chronic anemia. 4) COPD/Tobacco use.  LOS: 7 days   [name] 06/04/2018, 10:53 AM

## 2018-06-05 ENCOUNTER — Inpatient Hospital Stay (HOSPITAL_COMMUNITY): Payer: Medicare Other

## 2018-06-05 DIAGNOSIS — R6510 Systemic inflammatory response syndrome (SIRS) of non-infectious origin without acute organ dysfunction: Secondary | ICD-10-CM

## 2018-06-05 DIAGNOSIS — E43 Unspecified severe protein-calorie malnutrition: Secondary | ICD-10-CM

## 2018-06-05 DIAGNOSIS — M7989 Other specified soft tissue disorders: Secondary | ICD-10-CM

## 2018-06-05 DIAGNOSIS — I82409 Acute embolism and thrombosis of unspecified deep veins of unspecified lower extremity: Secondary | ICD-10-CM

## 2018-06-05 LAB — GLUCOSE, CAPILLARY
GLUCOSE-CAPILLARY: 108 mg/dL — AB (ref 70–99)
GLUCOSE-CAPILLARY: 144 mg/dL — AB (ref 70–99)
GLUCOSE-CAPILLARY: 193 mg/dL — AB (ref 70–99)
Glucose-Capillary: 123 mg/dL — ABNORMAL HIGH (ref 70–99)
Glucose-Capillary: 140 mg/dL — ABNORMAL HIGH (ref 70–99)
Glucose-Capillary: 147 mg/dL — ABNORMAL HIGH (ref 70–99)
Glucose-Capillary: 56 mg/dL — ABNORMAL LOW (ref 70–99)

## 2018-06-05 LAB — CBC
HEMATOCRIT: 26.1 % — AB (ref 36.0–46.0)
Hemoglobin: 8.2 g/dL — ABNORMAL LOW (ref 12.0–15.0)
MCH: 30.9 pg (ref 26.0–34.0)
MCHC: 31.4 g/dL (ref 30.0–36.0)
MCV: 98.5 fL (ref 78.0–100.0)
PLATELETS: 261 10*3/uL (ref 150–400)
RBC: 2.65 MIL/uL — ABNORMAL LOW (ref 3.87–5.11)
RDW: 17.1 % — AB (ref 11.5–15.5)
WBC: 15.1 10*3/uL — AB (ref 4.0–10.5)

## 2018-06-05 LAB — MAGNESIUM: MAGNESIUM: 1.7 mg/dL (ref 1.7–2.4)

## 2018-06-05 LAB — COMPREHENSIVE METABOLIC PANEL
ALBUMIN: 1.7 g/dL — AB (ref 3.5–5.0)
ALT: 9 U/L (ref 0–44)
ANION GAP: 15 (ref 5–15)
AST: 16 U/L (ref 15–41)
Alkaline Phosphatase: 37 U/L — ABNORMAL LOW (ref 38–126)
BILIRUBIN TOTAL: 0.5 mg/dL (ref 0.3–1.2)
BUN: 51 mg/dL — ABNORMAL HIGH (ref 8–23)
CHLORIDE: 93 mmol/L — AB (ref 98–111)
CO2: 28 mmol/L (ref 22–32)
Calcium: 8.4 mg/dL — ABNORMAL LOW (ref 8.9–10.3)
Creatinine, Ser: 2.07 mg/dL — ABNORMAL HIGH (ref 0.44–1.00)
GFR calc Af Amer: 28 mL/min — ABNORMAL LOW (ref >60)
GFR, EST NON AFRICAN AMERICAN: 24 mL/min — AB (ref >60)
Glucose, Bld: 149 mg/dL — ABNORMAL HIGH (ref 70–99)
POTASSIUM: 5 mmol/L (ref 3.5–5.1)
Sodium: 136 mmol/L (ref 135–145)
TOTAL PROTEIN: 4.8 g/dL — AB (ref 6.5–8.1)

## 2018-06-05 LAB — LIPASE, BLOOD: LIPASE: 176 U/L — AB (ref 11–51)

## 2018-06-05 LAB — PHOSPHORUS: PHOSPHORUS: 5.7 mg/dL — AB (ref 2.5–4.6)

## 2018-06-05 MED ORDER — PANTOPRAZOLE SODIUM 40 MG PO TBEC
40.0000 mg | DELAYED_RELEASE_TABLET | Freq: Every day | ORAL | Status: DC
  Administered 2018-06-06 – 2018-06-08 (×3): 40 mg via ORAL
  Filled 2018-06-05 (×3): qty 1

## 2018-06-05 MED ORDER — SODIUM CHLORIDE 0.9 % IV SOLN
1.0000 g | Freq: Two times a day (BID) | INTRAVENOUS | Status: AC
  Administered 2018-06-05 – 2018-06-10 (×11): 1 g via INTRAVENOUS
  Filled 2018-06-05 (×11): qty 1

## 2018-06-05 NOTE — Progress Notes (Signed)
Nurse tech reported blood sugar as reading 56.Reading is not accurate due to excess fluid in extremities. BMET reports blood sugar as 149.

## 2018-06-05 NOTE — Progress Notes (Signed)
[facility]                                                                              Patient Demographics  [name], is a 67 y.o. female, DOB - 02/23/1951, YTK:[id number]  Admit date - 05/28/2018   Admitting Physician Ivor Costa, MD  Outpatient Primary MD for the patient is Bernerd Limbo, MD  Outpatient specialists:   LOS - 8  days   Medical records reviewed and are as summarized below:    Chief Complaint  Patient presents with  . Abdominal Pain  . Emesis       Brief summary   Patient is a 67 year old female with hypertension, hyperlipidemia, diabetesCOPD on home oxygen, GERD, gout, depression with anxiety, tobacco abuse, atrial fibrillation not on anticoagulants, AAA, CAD, CABG, CHF, CKD-4, GI bleeding, mitral valve regurgitation, PAD who presented with abdominal pain. Patient was recently hospitalized from 8/4-8/16 due to GI bleeding required blood transfusion and gallstone pancreatitis.  Patient underwent laparoscopic cholecystectomy.  She was discharged in stable condition.  Patient presented back with nausea vomiting abdominal pain, poor oral intake.    Assessment & Plan    Principal Problem: Severe acute pancreatitis, recurrent versus unresolved, with abdominal pain, nausea and vomiting with pseudocyst -Autoimmune pancreatitis was ruled out, status post cholecystectomy last month on 8/14 -GI following closely, CT abdomen pelvis showed worsening severe inflammatory changes surrounding the pancreatic head and neck, descending duodenum, hepatic flexure of colon.  New complex fluid collection inferior to the gallbladder fossa with layering hyperdense material, favor worsening pancreatitis with enlarging pancreatic pseudocyst. -Leukocytosis trending up, on empiric Zosyn.  Will obtain blood cultures, change antibiotics to meropenem -Continue tube feeds via core track, advancing diet to full liquids.   - GI following closely, Repeat imaging?  Active  Problems: Severe protein calorie malnutrition in the context of #1, chronic illness, diarrhea C. difficile negative, continue loperamide as needed.  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  Received 1 unit packed RBCs on 9/26   Chest pain with elevated troponins, acute on chronic diastolic CHF Troponins trending down, peaked to 0.5, more consistent with demand ischemia.  Cardiology following recommended outpatient stress test, not a candidate for any invasive cardiac catheterization at this point -2D echo showed normal LV function, moderate mitral stenosis, mild MR -Cardiology follow-up  History of AAA repair in 03/5169 complicated with retroperitoneal bleed  COPD, tobacco use -Currently no wheezing, continue nicotine patch  Code Status: full  DVT Prophylaxis:  SCD's Family Communication: Discussed in detail with the patient, all imaging results,  lab results explained to the patient   Disposition Plan:  Time Spent in minutes 25 minutes  Procedures:  CT abdomen 2D echo  Consultants:   GI Cardiology  Antimicrobials:      Medications  Scheduled Meds: . allopurinol  100 mg Oral Daily  . ALPRAZolam  0.5 mg Oral QHS  . amLODipine  10 mg Oral Daily  . atorvastatin  40 mg Oral QPM  . carvedilol  25 mg Oral BID  WC  . cholecalciferol  5,000 Units Oral Daily  . cloNIDine  0.2 mg Oral TID  . clopidogrel  75 mg Oral Q breakfast  . DULoxetine  60 mg Oral Daily  . famotidine  20 mg Oral QAC supper  . fenofibrate  160 mg Oral QHS  . gabapentin  300 mg Oral BID  . heparin  5,000 Units Subcutaneous Q8H  . hydrALAZINE  100 mg Oral TID  . insulin aspart  0-9 Units Subcutaneous Q4H  . isosorbide mononitrate  15 mg Oral Daily  . lipase/protease/amylase  12,000 Units Oral TID AC  . mirtazapine  7.5 mg Oral QHS  . mometasone-formoterol  2 puff Inhalation BID  . nicotine  21 mg Transdermal Daily  . pantoprazole  40 mg Oral Q0600   Continuous Infusions: . sodium chloride Stopped (06/04/18 0016)  . feeding supplement (OSMOLITE 1.2 CAL) 1,000 mL (06/05/18 0046)  . lactated ringers 50 mL/hr at 06/04/18 0600  . piperacillin-tazobactam (ZOSYN)  IV 3.375 g (06/05/18 0848)   PRN Meds:.sodium chloride, acetaminophen **OR** acetaminophen, alum & mag hydroxide-simeth, hydrALAZINE, HYDROcodone-acetaminophen, HYDROmorphone (DILAUDID) injection, hydrOXYzine, levalbuterol, loperamide, ondansetron (ZOFRAN) IV, polyethylene glycol, zolpidem   Antibiotics   Anti-infectives (From admission, onward)   Start     Dose/Rate Route Frequency Ordered Stop   05/29/18 0600  piperacillin-tazobactam (ZOSYN) IVPB 3.375 g     3.375 g 12.5 mL/hr over 240 Minutes Intravenous Every 8 hours 05/28/18 2227     05/28/18 2230  piperacillin-tazobactam (ZOSYN) IVPB 3.375 g     3.375 g 100 mL/hr over 30 Minutes Intravenous  Once 05/28/18 2227 05/29/18 0011        Subjective:   [name] was seen and examined today.  Feels a little better today, states that she sat up in the chair for 3 hours yesterday.  Very weak and somewhat debilitated.  No nausea vomiting.  No fevers today.    Objective:   Vitals:   06/05/18 0603 06/05/18 0718 06/05/18 0746 06/05/18 1248  BP: (!) 140/40  (!) 131/45 (!) 123/49  Pulse: 72  68 60  Resp: (!) 24   18 18   Temp: 98.2 F (36.8 C)  98.3 F (36.8 C) 97.9 F (36.6 C)  TempSrc: Oral  Oral Oral  SpO2: 91% 93% 98% 97%  Weight:      Height:        Intake/Output Summary (Last 24 hours) at 06/05/2018 1410 Last data filed at 06/05/2018 1306 Gross per 24 hour  Intake 1270 ml  Output 653 ml  Net 617 ml     Wt Readings from Last 3 Encounters:  05/30/18 56.6 kg  05/15/18 55.8 kg  04/28/18 60.3 kg     Exam   General: Alert and oriented x 3, NAD Eyes:  HEENT:  Atraumatic, normocephalic Cardiovascular: S1 S2 auscultated,  RRR. No pedal edema b/l Respiratory: Decreased breath sound at the base Gastrointestinal: Soft, nontender, nondistended, + bowel sounds Ext: upper extremity swelling Neuro: no new deficits Musculoskeletal: No digital cyanosis, clubbing Skin:  No rashes Psych: Normal affect and demeanor, alert and oriented x3        Data Reviewed:  I have personally reviewed following labs and imaging studies  Micro Results Recent Results (from the past 240 hour(s))  Blood culture (routine x 2)     Status: None   Collection Time: 05/28/18  5:54 PM  Result Value Ref Range Status   Specimen Description BLOOD RIGHT ANTECUBITAL  Final   Special Requests   Final    BOTTLES DRAWN AEROBIC AND ANAEROBIC Blood Culture results may not be optimal due to an excessive volume of blood received in culture bottles   Culture   Final    NO GROWTH 5 DAYS Performed at [facility], [place] [address] [place], [place] [address]    Report Status 06/02/2018 FINAL  Final  Blood culture (routine x 2)     Status: None   Collection Time: 05/28/18  6:35 PM  Result Value Ref Range Status   Specimen Description BLOOD BLOOD RIGHT FOREARM  Final   Special Requests   Final    BOTTLES DRAWN AEROBIC ONLY Blood Culture results may not be optimal due to an inadequate volume of blood received in culture bottles   Culture   Final    NO GROWTH 5 DAYS Performed at [facility], [place] [address] [place], [place] [address]    Report Status 06/02/2018 FINAL  Final  MRSA PCR Screening     Status: None   Collection Time: 05/30/18  2:53 PM  Result Value Ref Range Status   MRSA by PCR NEGATIVE NEGATIVE Final    Comment:        The GeneXpert MRSA Assay (FDA approved for NASAL specimens only), is one component of a comprehensive MRSA colonization surveillance program. It is not intended to diagnose MRSA infection nor to guide or monitor treatment for MRSA infections. Performed at [facility], [place] [address], [place], [place] [address]   C difficile quick scan w PCR reflex     Status: None   Collection Time: 05/31/18  9:11 AM  Result Value Ref Range Status   C Diff antigen NEGATIVE NEGATIVE Final   C Diff toxin NEGATIVE NEGATIVE Final   C Diff interpretation No C. difficile detected.  Final    Comment: Performed at [facility], [place] [address] [place], [place] [address]    Radiology Reports Ct Abdomen Pelvis Wo Contrast  Result Date: 05/28/2018 CLINICAL DATA:  New tiny right lung nodule on chest radiograph. Severe right lower quadrant abdominal pain with nausea, vomiting and diarrhea. Chronic kidney disease. Prior cholecystectomy 04/19/2018, hysterectomy and appendectomy. Prior bowel resection. EXAM: CT CHEST, ABDOMEN AND PELVIS WITHOUT CONTRAST TECHNIQUE: Multidetector CT imaging of the chest, abdomen and pelvis was performed following the standard protocol without IV contrast. COMPARISON:  Chest radiograph from earlier today. FINDINGS: CT CHEST FINDINGS Cardiovascular: Borderline mild cardiomegaly. No significant pericardial effusion/thickening. 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 Atherosclerotic nonaneurysmal thoracic aorta. Proximal left subclavian artery stent is noted. Dilated main pulmonary artery (4.0 cm diameter). Mediastinum/Nodes: No discrete thyroid nodules. Unremarkable esophagus. No axillary adenopathy. Mild right paratracheal  adenopathy up to 1.1 cm (series 3/image 21). Mildly enlarged 1.1 cm subcarinal node (series 3/image 26). No discrete hilar adenopathy on this noncontrast scan. Lungs/Pleura: No pneumothorax. No pleural effusion. A few tiny calcified granulomas are present in the upper lobes bilaterally. No acute consolidative airspace disease, lung masses or  significant pulmonary nodules. Diffuse interlobular septal thickening. Musculoskeletal: No aggressive appearing focal osseous lesions. Intact sternotomy wires. Moderate thoracic spondylosis. CT ABDOMEN PELVIS FINDINGS Hepatobiliary: Normal liver with no liver mass. Cholecystectomy. Bile ducts are within normal post cholecystectomy limits with CBD diameter 7 mm. Pancreas: Thickening of the pancreatic head and neck. Prominent peripancreatic fat stranding and ill-defined fluid in the pancreatic head and neck, worsened. Poorly defined 5.1 x 2.5 cm anterior pancreatic head complex fluid collection with heterogeneous internal density (series 3/image 66), increased from 2.3 x 1.2 cm. No definite pancreatic duct dilation. Severe wall thickening throughout the adjacent descending duodenum, worsened. Fluid collection inferior to the gallbladder fossa measures 5.0 x 3.0 cm (series 3/image 71) and demonstrates layering hyperdense material, new. Spleen: Normal size. No mass. Adrenals/Urinary Tract: Normal adrenals. No hydronephrosis. No renal stones. Partially exophytic simple 1.3 cm posterior interpolar left renal cyst. No additional contour deforming renal lesions. Normal bladder. Stomach/Bowel: Normal non-distended stomach. Normal caliber small bowel with no small bowel wall thickening. Appendectomy. New segmental wall thickening in the hepatic flexure of the colon. No additional sites of large bowel wall thickening. Stable postsurgical changes from distal large bowel resection. Vascular/Lymphatic: Atherosclerotic abdominal aorta with stable 5.0 cm infrarenal abdominal aortic aneurysm  status post aorto bi-iliac stent graft repair. Surgical clips are noted in the right inguinal region. No pathologically enlarged lymph nodes in the abdomen or pelvis. Reproductive: Status post hysterectomy, with no abnormal findings at the vaginal cuff. No adnexal mass. Other: No pneumoperitoneum.  New small volume perihepatic ascites. Musculoskeletal: No aggressive appearing focal osseous lesions. Mild lumbar spondylosis. IMPRESSION: CT CHEST: 1. No lung masses or significant pulmonary nodules. 2. Borderline mild cardiomegaly. Diffuse interlobular septal thickening suggests mild pulmonary edema. No pleural effusions. 3. Dilated main pulmonary artery, suggesting pulmonary arterial hypertension. CT ABDOMEN PELVIS: 1. Worsening severe inflammatory changes surrounding the pancreatic head and neck, descending duodenum and hepatic flexure of the colon. Enlarging nonspecific complex poorly defined fluid collection in the anterior pancreatic head. New complex fluid collection inferior to the gallbladder fossa with layering hyperdense material. Burtis Junes worsening pancreatitis with enlarging pancreatic pseudocysts. Marked wall thickening in the descending duodenum has worsened, and could be reactive, with peptic ulcer disease difficult to exclude. No free air. Short-term follow-up MRI or CT abdomen with IV contrast suggested. 2. New wall thickening throughout the hepatic flexure of the colon, probably reactive. 3. Cholecystectomy. No intrahepatic biliary ductal dilatation. CBD diameter 7 mm, within normal post cholecystectomy limits. 4. New small volume perihepatic ascites. 5.  Aortic Atherosclerosis (ICD10-I70.0). Electronically Signed   By: Ilona Sorrel M.D.   On: 05/28/2018 20:41   Ct Abdomen Pelvis Wo Contrast  Result Date: 05/16/2018 CLINICAL DATA:  Acute abdominal pain.  Cholecystectomy last month. EXAM: CT ABDOMEN AND PELVIS WITHOUT CONTRAST TECHNIQUE: Multidetector CT imaging of the abdomen and pelvis was performed  following the standard protocol without IV contrast. COMPARISON:  Multiple prior exams most recent CT 04/18/2018 FINDINGS: Lower chest: Improved pleural effusions with small right greater than left residual. Mild septal thickening at the lung bases. Coronary artery calcifications. Mild cardiomegaly. Hepatobiliary: No focal hepatic lesion on noncontrast exam. Postcholecystectomy with clips in the gallbladder fossa. Small hyperdensity in the gallbladder fossa may be cystic duct remnant with small stone. No biliary dilatation. Pancreas: Similar appearance of peripancreatic fat stranding with stranding involving the region of the duodenum. Previously seen fluid collections in the pancreatic head are not definitively visualized, lack of IV contrast limits detailed assessment. No evidence of  progressive fluid collection. No ductal dilatation. Spleen: Normal in size. Adrenals/Urinary Tract: Mild left adrenal thickening without dominant nodule. Right adrenal gland is normal. Calcifications of both renal hila may be vascular or nonobstructing stones, grossly unchanged from prior exam. No hydronephrosis. Small low-density lesion arising from the left kidney, likely cysts. Urinary bladder is nondistended and not well evaluated. Stomach/Bowel: Again seen wall thickening about the distal stomach and duodenum, not well assessed in the absence of contrast. Degree of stranding has slightly increased from prior exam. No other small or large bowel inflammation or wall thickening. Enteric sutures in the sigmoid colon. Prior appendectomy. Vascular/Lymphatic: Aorto bi-iliac stent graft. Residual aneurysmal diameter of 5.1 cm, unchanged. Stent at the origin of both renal arteries. Aortic and peripheral atherosclerosis. Diffuse atherosclerosis of external iliac arteries. Reproductive: Status post hysterectomy. No adnexal masses. Other: Fluid collection in the left retroperitoneum inferior to the left kidney and lateral to the psoas muscle  currently measures 4.5 x 3.4 cm, previously 5.5 x 4 cm. No internal air. Upper abdominal stranding and edema. No frank ascites. No free air. Musculoskeletal: There are no acute or suspicious osseous abnormalities. IMPRESSION: 1. Persistent fat stranding and poorly defined fat planes in the region of the pancreatic head and duodenum. Favor persistent or recurrent pancreatitis over duodenitis. Previous small fluid collections in the region of the pancreatic head are not currently visualized and may have resolved, lack of contrast limits assessment. If the patient can tolerate breath hold technique, pancreatic protocol MRI may be of value to further assess. 2. Interval cholecystectomy. Small hyperdensity in the gallbladder fossa may be gallstone within the cystic duct remnant, no evidence of inflammatory change. 3. Decreasing size of left retroperitoneal hematoma. 4. Decreased bilateral pleural effusions from prior exam with mild residual. Septal thickening in the lung bases suspicious for pulmonary edema. 5. Aorto bi-iliac stent graft repair of abdominal aortic aneurysm with unchanged residual aortic diameter. Diffuse aortic and branch atherosclerosis. Aortic Atherosclerosis (ICD10-I70.0). 6. Electronically Signed   By: Keith Rake M.D.   On: 05/16/2018 07:07   Ct Chest Wo Contrast  Result Date: 05/28/2018 CLINICAL DATA:  New tiny right lung nodule on chest radiograph. Severe right lower quadrant abdominal pain with nausea, vomiting and diarrhea. Chronic kidney disease. Prior cholecystectomy 04/19/2018, hysterectomy and appendectomy. Prior bowel resection. EXAM: CT CHEST, ABDOMEN AND PELVIS WITHOUT CONTRAST TECHNIQUE: Multidetector CT imaging of the chest, abdomen and pelvis was performed following the standard protocol without IV contrast. COMPARISON:  Chest radiograph from earlier today. FINDINGS: CT CHEST FINDINGS Cardiovascular: Borderline mild cardiomegaly. No significant pericardial effusion/thickening.  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 Atherosclerotic nonaneurysmal thoracic aorta. Proximal left subclavian artery stent is noted. Dilated main pulmonary artery (4.0 cm diameter). Mediastinum/Nodes: No discrete thyroid nodules. Unremarkable esophagus. No axillary adenopathy. Mild right paratracheal adenopathy up to 1.1 cm (series 3/image 21). Mildly enlarged 1.1 cm subcarinal node (series 3/image 26). No discrete hilar adenopathy on this noncontrast scan. Lungs/Pleura: No pneumothorax. No pleural effusion. A few tiny calcified granulomas are present in the upper lobes bilaterally. No acute consolidative airspace disease, lung masses or significant pulmonary nodules. Diffuse interlobular septal thickening. Musculoskeletal: No aggressive appearing focal osseous lesions. Intact sternotomy wires. Moderate thoracic spondylosis. CT ABDOMEN PELVIS FINDINGS Hepatobiliary: Normal liver with no liver mass. Cholecystectomy. Bile ducts are within normal post cholecystectomy limits with CBD diameter 7 mm. Pancreas: Thickening of the pancreatic head and neck. Prominent peripancreatic fat stranding and ill-defined fluid in the pancreatic head and  neck, worsened. Poorly defined 5.1 x 2.5 cm anterior pancreatic head complex fluid collection with heterogeneous internal density (series 3/image 66), increased from 2.3 x 1.2 cm. No definite pancreatic duct dilation. Severe wall thickening throughout the adjacent descending duodenum, worsened. Fluid collection inferior to the gallbladder fossa measures 5.0 x 3.0 cm (series 3/image 71) and demonstrates layering hyperdense material, new. Spleen: Normal size. No mass. Adrenals/Urinary Tract: Normal adrenals. No hydronephrosis. No renal stones. Partially exophytic simple 1.3 cm posterior interpolar left renal cyst. No additional contour deforming renal lesions. Normal bladder. Stomach/Bowel: Normal non-distended stomach. Normal caliber small bowel with no small  bowel wall thickening. Appendectomy. New segmental wall thickening in the hepatic flexure of the colon. No additional sites of large bowel wall thickening. Stable postsurgical changes from distal large bowel resection. Vascular/Lymphatic: Atherosclerotic abdominal aorta with stable 5.0 cm infrarenal abdominal aortic aneurysm status post aorto bi-iliac stent graft repair. Surgical clips are noted in the right inguinal region. No pathologically enlarged lymph nodes in the abdomen or pelvis. Reproductive: Status post hysterectomy, with no abnormal findings at the vaginal cuff. No adnexal mass. Other: No pneumoperitoneum.  New small volume perihepatic ascites. Musculoskeletal: No aggressive appearing focal osseous lesions. Mild lumbar spondylosis. IMPRESSION: CT CHEST: 1. No lung masses or significant pulmonary nodules. 2. Borderline mild cardiomegaly. Diffuse interlobular septal thickening suggests mild pulmonary edema. No pleural effusions. 3. Dilated main pulmonary artery, suggesting pulmonary arterial hypertension. CT ABDOMEN PELVIS: 1. Worsening severe inflammatory changes surrounding the pancreatic head and neck, descending duodenum and hepatic flexure of the colon. Enlarging nonspecific complex poorly defined fluid collection in the anterior pancreatic head. New complex fluid collection inferior to the gallbladder fossa with layering hyperdense material. Burtis Junes worsening pancreatitis with enlarging pancreatic pseudocysts. Marked wall thickening in the descending duodenum has worsened, and could be reactive, with peptic ulcer disease difficult to exclude. No free air. Short-term follow-up MRI or CT abdomen with IV contrast suggested. 2. New wall thickening throughout the hepatic flexure of the colon, probably reactive. 3. Cholecystectomy. No intrahepatic biliary ductal dilatation. CBD diameter 7 mm, within normal post cholecystectomy limits. 4. New small volume perihepatic ascites. 5.  Aortic Atherosclerosis  (ICD10-I70.0). Electronically Signed   By: Ilona Sorrel M.D.   On: 05/28/2018 20:41   Dg Chest Portable 1 View  Result Date: 05/28/2018 CLINICAL DATA:  Severe right lower quadrant pain. EXAM: PORTABLE CHEST 1 VIEW COMPARISON:  04/18/2018 FINDINGS: The lungs are clear without focal pneumonia, edema, pneumothorax or pleural effusion. Interstitial markings are diffusely coarsened with chronic features. Tiny nodular density identified right upper lung, superimposed on anterior right second rib. Patient is status post median sternotomy. Left atrial appendage occluded device noted. Vascular stent superimposed on the transverse aorta. The visualized bony structures of the thorax are intact. IMPRESSION: Cardiomegaly with mild vascular congestion or chronic interstitial coarsening. Tiny nodule right upper lung not visible on prior studies and indeterminate. CT chest without contrast could be used to further evaluate. Electronically Signed   By: Misty Stanley M.D.   On: 05/28/2018 17:23   Dg Abd 2 Views  Result Date: 05/30/2018 CLINICAL DATA:  History of pancreatitis.  Abdominal pain. EXAM: ABDOMEN - 2 VIEW COMPARISON:  Chest radiograph 05/29/2018 FINDINGS: Feeding tube tip projects at the gastric antrum/proximal duodenum, unchanged. Aortic stent graft material. Monitoring leads overlie the patient. Gas is demonstrated within nondilated loops of large and small bowel. Small left pleural effusion. Heterogeneous opacities left lung base. IMPRESSION: Feeding tube projects at the gastric antrum/proximal duodenum, unchanged.  Decreased gaseous distention of the stomach and small bowel. Electronically Signed   By: Lovey Newcomer M.D.   On: 05/30/2018 10:49   Dg Abd Portable 1v  Result Date: 05/29/2018 CLINICAL DATA:  Feeding tube placement EXAM: PORTABLE ABDOMEN - 1 VIEW COMPARISON:  None. FINDINGS: Gaseous distension of the stomach and proximal small bowel. Feeding tube with the tip projecting over antrum versus proximal  duodenum. No evidence of pneumoperitoneum, portal venous gas or pneumatosis. No pathologic calcifications along the expected course of the ureters. Aorto bi-iliac stent graft is noted. Left renal artery stent is noted. Osseous structures are unremarkable. IMPRESSION: Gaseous distension of the stomach and proximal small bowel. Feeding tube with the tip projecting over antrum versus proximal duodenum. Electronically Signed   By: Kathreen Devoid   On: 05/29/2018 15:30    Lab Data:  CBC: Recent Labs  Lab 05/29/18 1528  06/01/18 0450 06/02/18 2426 06/03/18 0526 06/04/18 0537 06/05/18 0538  WBC 14.1*   < > 9.6 12.7* 13.7* 13.3* 15.1*  NEUTROABS 12.9*  --   --   --   --   --   --   HGB 8.2*   < > 6.7* 8.8* 8.4* 8.1* 8.2*  HCT 26.7*   < > 21.0* 27.6* 25.9* 26.0* 26.1*  MCV 100.4*   < > 98.1 96.5 96.3 98.9 98.5  PLT 218   < > 178 202 236 241 261   < > = values in this interval not displayed.   Basic Metabolic Panel: Recent Labs  Lab 06/01/18 0450 06/01/18 1841 06/02/18 0608 06/03/18 0526 06/04/18 0537 06/05/18 0538  NA 137  --  137 135 135 136  K 2.8* 3.8 4.3 4.2 4.8 5.0  CL 99  --  99 95* 94* 93*  CO2 29  --  28 29 30 28   GLUCOSE 184*  --  119* 142* 137* 149*  BUN 33*  --  36* 42* 45* 51*  CREATININE 1.76*  --  1.66* 1.74* 1.85* 2.07*  CALCIUM 7.2*  --  7.7* 7.7* 8.0* 8.4*  MG 1.6*  --  2.4 2.0 2.0 1.7  PHOS 1.4*  --  2.7 4.1 5.5* 5.7*   GFR: Estimated Creatinine Clearance: 24.2 mL/min (A) (by C-G formula based on SCr of 2.07 mg/dL (H)). Liver Function Tests: Recent Labs  Lab 06/01/18 0450 06/02/18 8341 06/03/18 0526 06/04/18 0537 06/05/18 0538  AST 11* 16 19 16 16   ALT 5 6 8 8 9   ALKPHOS 36* 41 41 36* 37*  BILITOT 0.5 0.6 0.5 0.6 0.5  PROT 4.4* 4.8* 5.1* 4.6* 4.8*  ALBUMIN 1.5* 1.7* 1.7* 1.6* 1.7*   Recent Labs  Lab 06/01/18 0450 06/02/18 9622 06/03/18 0526 06/04/18 0537 06/05/18 0538  LIPASE 121* 106* 103* 93* 176*   No results for input(s): AMMONIA in the  last 168 hours. Coagulation Profile: No results for input(s): INR, PROTIME in the last 168 hours. Cardiac Enzymes: Recent Labs  Lab 05/29/18 1528 05/29/18 2013 05/30/18 0236  TROPONINI 0.44* 0.34* 0.31*   BNP (last 3 results) No results for input(s): PROBNP in the last 8760 hours. HbA1C: No results for input(s): HGBA1C in the last 72 hours. CBG: Recent Labs  Lab 06/04/18 2019 06/04/18 2355 06/05/18 0420 06/05/18 0748 06/05/18 1247  GLUCAP 162* 108* 144* 56* 140*   Lipid Profile: No results for input(s): CHOL, HDL, LDLCALC, TRIG, CHOLHDL, LDLDIRECT in the last 72 hours. Thyroid Function Tests: No results for input(s): TSH, T4TOTAL, FREET4, T3FREE, THYROIDAB in the  last 72 hours. Anemia Panel: No results for input(s): VITAMINB12, FOLATE, FERRITIN, TIBC, IRON, RETICCTPCT in the last 72 hours. Urine analysis:    Component Value Date/Time   COLORURINE YELLOW 05/28/2018 0506   APPEARANCEUR HAZY (A) 05/28/2018 0506   LABSPEC 1.021 05/28/2018 0506   PHURINE 5.0 05/28/2018 0506   GLUCOSEU NEGATIVE 05/28/2018 0506   HGBUR NEGATIVE 05/28/2018 0506   BILIRUBINUR NEGATIVE 05/28/2018 0506   KETONESUR 5 (A) 05/28/2018 0506   PROTEINUR NEGATIVE 05/28/2018 0506   UROBILINOGEN 0.2 03/04/2011 2107   NITRITE NEGATIVE 05/28/2018 0506   LEUKOCYTESUR SMALL (A) 05/28/2018 0506     Khiem Gargis M.D. [facility] 06/05/2018, 2:10 PM  Pager: [phone] Between 7am to 7pm - call Pager - [phone]  After 7pm go to [email or web address] - password [email or web address]  Call night coverage person covering after 7pm

## 2018-06-05 NOTE — Progress Notes (Signed)
Preliminary notes--Bilateral upper extremities venous duplex exam completed. Negative for deep and superficial veins thrombosis.  Hongying Tahje Borawski (RDMS RVT) 06/05/18 12:16 PM

## 2018-06-05 NOTE — Progress Notes (Signed)
Pharmacy Antibiotic Note  [name] is a 67 y.o. female admitted on 05/28/2018 with intraabdominal infection.  Pharmacy has been consulted for meropenem dosing.  Plan: Meropenem 1000mg  IV q12h D/C zosyn Monitor for deescalation and clinical course.   Height: 5\' 5"  (165.1 cm) Weight: 124 lb 12.5 oz (56.6 kg) IBW/kg (Calculated) : 57  Temp (24hrs), Avg:98.2 F (36.8 C), Min:97.9 F (36.6 C), Max:98.4 F (36.9 C)  Recent Labs  Lab 06/01/18 0450 06/02/18 0608 06/03/18 0526 06/04/18 0537 06/05/18 0538  WBC 9.6 12.7* 13.7* 13.3* 15.1*  CREATININE 1.76* 1.66* 1.74* 1.85* 2.07*    Estimated Creatinine Clearance: 24.2 mL/min (A) (by C-G formula based on SCr of 2.07 mg/dL (H)).    Allergies  Allergen Reactions  . Isosorbide Other (See Comments)    Can only tolerate in low doses  . Isosorbide Nitrate Other (See Comments)    Can only tolerate in low doses  . Oxycodone-Acetaminophen Itching  . Codeine Itching  . Contrast Media [Iodinated Diagnostic Agents] Itching and Other (See Comments)    Itching of feet  . Ioxaglate Itching and Other (See Comments)    Itching of feet  . Metrizamide Itching and Other (See Comments)    Itching of feet  . Percocet [Oxycodone-Acetaminophen] Itching and Other (See Comments)    Tolerates Hydrocodone    Antimicrobials this admission: Zosyn 9/22 >> 9/30 Meropenem 9/30 >>   Tanmay Halteman A. Levada Dy, PharmD, [place] Pager: [phone] Please utilize Amion for appropriate phone number to reach the unit pharmacist ([place])   06/05/2018 2:28 PM

## 2018-06-05 NOTE — Progress Notes (Addendum)
Daily Rounding Note  06/05/2018, 11:38 AM  LOS: 8 days   SUBJECTIVE:   Chief complaint: Abdominal pain persists.  It is not worsened by tube feedings or by taking in clear liquids.  She is tolerating tube feeds and the clear liquids.   BMs this morning, the consistency was that of oatmeal, i.e. soft and semi-to unformed. Family has a lot of questions regarding care after discharge.  If she would need to have a PEG placed. No fevers. Had Doppler studies of her swollen right upper extremity this morning, results are pending.  OBJECTIVE:         Vital signs in last 24 hours:    Temp:  [98.1 F (36.7 C)-98.4 F (36.9 C)] 98.3 F (36.8 C) (09/30 0746) Pulse Rate:  [62-72] 68 (09/30 0746) Resp:  [18-24] 18 (09/30 0746) BP: (124-140)/(40-51) 131/45 (09/30 0746) SpO2:  [90 %-98 %] 98 % (09/30 0746) Last BM Date: 06/04/18 Filed Weights   05/29/18 2048 05/30/18 1653  Weight: 56.6 kg 56.6 kg   General: Frail, still looks ill but she looks marginally better than she did last week.  Very weak. Heart: RRR. Chest: Diminished breath sounds.  Clear bilaterally.  No labored breathing. Abdomen: Soft.  Tender Extremities: Nonpitting, generalized swelling of the right upper extremity.  Lower extremities are not swollen. Neuro/Psych: Alert.  Cooperative.  Not confused.  Does not have a lot to say.  Intake/Output from previous day: 09/29 0701 - 09/30 0700 In: 1219.8 [P.O.:240; NG/GT:780; IV Piggyback:199.8] Out: 0   Intake/Output this shift: Total I/O In: 270 [P.O.:220; IV Piggyback:50] Out: -   Lab Results: Recent Labs    06/03/18 0526 06/04/18 0537 06/05/18 0538  WBC 13.7* 13.3* 15.1*  HGB 8.4* 8.1* 8.2*  HCT 25.9* 26.0* 26.1*  PLT 236 241 261   BMET Recent Labs    06/03/18 0526 06/04/18 0537 06/05/18 0538  NA 135 135 136  K 4.2 4.8 5.0  CL 95* 94* 93*  CO2 29 30 28   GLUCOSE 142* 137* 149*  BUN 42* 45* 51*    CREATININE 1.74* 1.85* 2.07*  CALCIUM 7.7* 8.0* 8.4*   LFT Recent Labs    06/03/18 0526 06/04/18 0537 06/05/18 0538  PROT 5.1* 4.6* 4.8*  ALBUMIN 1.7* 1.6* 1.7*  AST 19 16 16   ALT 8 8 9   ALKPHOS 41 36* 37*  BILITOT 0.5 0.6 0.5   PT/INR No results for input(s): LABPROT, INR in the last 72 hours. Hepatitis Panel No results for input(s): HEPBSAG, HCVAB, HEPAIGM, HEPBIGM in the last 72 hours.  Studies/Results: No results found.  ASSESMENT:   *   Severe, acute pancreatitis.  Question recurrent versus unresolved from mid August. Serial CTs (last on 9/22, non con due to AKI/CKD, can not receive Gadolinium for MRCP either) show worsening inflammation and complex fluid collections favoring enlarging pseudocyst. Ruled out for autoimmune pancreatitis.  Etiology may be gallstone related.  A/P lap chole 04/19/2018. No fevers but white blood cell count is rising. On Zosyn.    *    Normocytic anemia, relatively stable.  *    Diarrhea.  Question C. difficile negative antibiotic associated diarrhea versus tube feed related diarrhea.  PRN imodium in place.   On Creon.    *    Acute on chronic anemia.  *   AKI with background of stage 4 CKD.  Underwent creation of left arm AV fistula 10/2017.  *    Chronic  Plavix following cardiac stent 2016.  Diastolic heart failure, has tolerated positive fluid balance well in terms of breathing.  *   Diastolic heart failure.  Previous mitral valve repair, moderate mitral stenosis and regurg, grade 2 diastolic dysfunction, EF 69-4 to 5% on echo 9/24.    *  Severe protein Malnutrition, debilitation.  Began well before the pancreatitis in August and well before 01/17/18 aortic aneurysm stent graft repair with post-op RP hemorrhage. On osmolyte 1.2 tube feeds at 65/ml/hour, clear liquids.  Marland Kitchen       PLAN   *   Advance diet to full liquids.  Continue tube feedings via [place].      Azucena Freed  06/05/2018, 11:38 AM Phone [phone]   Attending  physician's note   I have taken a history, examined the patient and reviewed the chart. I agree with the Advanced Practitioner's note, impression and recommendations. Severe pancreatitis.  Persistently elevated WBC count. ?  Pancreatic necrosis/complex pseudocyst.  On IV Zosyn. Given history of chronic kidney disease and AKI, imaging is limited due to noncontrast, and non-gadolinium studies Continue supportive care Continue tube feeds and slowly advance diet as tolerated If diarrhea is worsening, please consult nutrition for lower osmotic feeds Patient had a lot of questions regarding discharge planning, informed her that Dr. Hildred Laser is in charge of discharge planning and will discuss with her and family once she is closer to being discharged  K. Denzil Magnuson , MD [phone]

## 2018-06-05 NOTE — Progress Notes (Addendum)
Patient c/o inability to void. Patient states it has been 2 days since she has voided. No report given from previous shifts that would indicate this to be true. Bladder scan shows 690ml. Have paged and spoken with Dr. Tana Coast and been given verbal orders to straight cath x1, then bladder scan in 4 hours. If bladder scan is >300 then place foley.

## 2018-06-06 LAB — COMPREHENSIVE METABOLIC PANEL
ALT: 9 U/L (ref 0–44)
ANION GAP: 9 (ref 5–15)
AST: 16 U/L (ref 15–41)
Albumin: 1.6 g/dL — ABNORMAL LOW (ref 3.5–5.0)
Alkaline Phosphatase: 33 U/L — ABNORMAL LOW (ref 38–126)
BUN: 55 mg/dL — AB (ref 8–23)
CHLORIDE: 94 mmol/L — AB (ref 98–111)
CO2: 32 mmol/L (ref 22–32)
Calcium: 8.1 mg/dL — ABNORMAL LOW (ref 8.9–10.3)
Creatinine, Ser: 2.23 mg/dL — ABNORMAL HIGH (ref 0.44–1.00)
GFR calc Af Amer: 25 mL/min — ABNORMAL LOW (ref >60)
GFR, EST NON AFRICAN AMERICAN: 22 mL/min — AB (ref >60)
Glucose, Bld: 133 mg/dL — ABNORMAL HIGH (ref 70–99)
POTASSIUM: 5.2 mmol/L — AB (ref 3.5–5.1)
Sodium: 135 mmol/L (ref 135–145)
Total Bilirubin: 0.4 mg/dL (ref 0.3–1.2)
Total Protein: 4.9 g/dL — ABNORMAL LOW (ref 6.5–8.1)

## 2018-06-06 LAB — PHOSPHORUS: PHOSPHORUS: 6.3 mg/dL — AB (ref 2.5–4.6)

## 2018-06-06 LAB — CBC
HEMATOCRIT: 24.5 % — AB (ref 36.0–46.0)
HEMOGLOBIN: 7.6 g/dL — AB (ref 12.0–15.0)
MCH: 30.5 pg (ref 26.0–34.0)
MCHC: 31 g/dL (ref 30.0–36.0)
MCV: 98.4 fL (ref 78.0–100.0)
Platelets: 269 10*3/uL (ref 150–400)
RBC: 2.49 MIL/uL — AB (ref 3.87–5.11)
RDW: 17 % — ABNORMAL HIGH (ref 11.5–15.5)
WBC: 13.6 10*3/uL — AB (ref 4.0–10.5)

## 2018-06-06 LAB — GLUCOSE, CAPILLARY
Glucose-Capillary: 144 mg/dL — ABNORMAL HIGH (ref 70–99)
Glucose-Capillary: 129 mg/dL — ABNORMAL HIGH (ref 70–99)
Glucose-Capillary: 173 mg/dL — ABNORMAL HIGH (ref 70–99)
Glucose-Capillary: 118 mg/dL — ABNORMAL HIGH (ref 70–99)
Glucose-Capillary: 126 mg/dL — ABNORMAL HIGH (ref 70–99)

## 2018-06-06 LAB — MAGNESIUM: MAGNESIUM: 1.7 mg/dL (ref 1.7–2.4)

## 2018-06-06 LAB — LIPASE, BLOOD: LIPASE: 118 U/L — AB (ref 11–51)

## 2018-06-06 MED ORDER — FUROSEMIDE 10 MG/ML IJ SOLN
20.0000 mg | Freq: Once | INTRAMUSCULAR | Status: AC
  Administered 2018-06-06: 20 mg via INTRAVENOUS
  Filled 2018-06-06: qty 2

## 2018-06-06 MED ORDER — VITAL 1.5 CAL PO LIQD
1000.0000 mL | ORAL | Status: DC
  Administered 2018-06-06 – 2018-06-07 (×2): 1000 mL
  Filled 2018-06-06 (×4): qty 1000

## 2018-06-06 MED ORDER — CARVEDILOL 12.5 MG PO TABS
12.5000 mg | ORAL_TABLET | Freq: Two times a day (BID) | ORAL | Status: DC
  Administered 2018-06-06 – 2018-06-14 (×16): 12.5 mg via ORAL
  Filled 2018-06-06 (×16): qty 1

## 2018-06-06 MED ORDER — ALBUMIN HUMAN 25 % IV SOLN
25.0000 g | Freq: Once | INTRAVENOUS | Status: AC
  Administered 2018-06-06: 25 g via INTRAVENOUS
  Filled 2018-06-06: qty 100

## 2018-06-06 MED ORDER — CLONIDINE HCL 0.1 MG PO TABS
0.1000 mg | ORAL_TABLET | Freq: Three times a day (TID) | ORAL | Status: DC
  Administered 2018-06-06 – 2018-06-14 (×26): 0.1 mg via ORAL
  Filled 2018-06-06 (×26): qty 1

## 2018-06-06 NOTE — Evaluation (Signed)
Physical Therapy Evaluation Patient Details Name: [name] MRN: [id number] DOB: 1950-09-11 Today's Date: 06/06/2018   History of Present Illness  67yo female with c/o of abdominal pain. Note recent hospitalization 04/09/18 to 04/21/18 with GI bleed, gallstone pancreatitis, and during which she received laparoscopic cholecystectomy. Diagnosed with recurrent pancreatitis. PMH AAA, CHF, chronic back pain, CKD, COPD, GI bleed, HTN, MI, A-fib, DM, cardiac cath, CABG, maze procedure, mitral valve repair, R femoral-popliteal bypass   Clinical Impression   Patient received up in chair, reporting high levels of pain but willing to participate in PT today. VSS at rest on room air. MMT ankle dorsiflexion 3/5, knee extension 4/5, seated hip ABD 4-/5, seated hip flexion 3/5. Able to complete functional sit to stand with ModA for boost to full standing position, and able to take a couple of steps forward and backward in front of chair with RW and min guard, very limited by pain and fatigue. VSS with activity, SPO2 90% on room air with activities performed today. Session very limited due to pain and reduced functional activity tolerance. She was left up in the chair with all needs met and questions/concerns addressed this afternoon. She will continue to benefit from skilled PT services in the acute setting as well as skilled PT services moving forward (in-house PT at [facility], or ST-SNF if she does not qualify for [facility]).     Follow Up Recommendations LTACH;Other (comment)(if she does not qualify for LTACH, recommend SNF )    Equipment Recommendations  Other (comment)(defer to next venue )    Recommendations for Other Services       Precautions / Restrictions Precautions Precautions: Fall;Other (comment) Precaution Comments: watch SPO2/HR  Restrictions Weight Bearing Restrictions: No      Mobility  Bed Mobility               General bed mobility comments: DNT, received up in chair    Transfers Overall transfer level: Needs assistance Equipment used: Rolling walker (2 wheeled) Transfers: Sit to/from Stand Sit to Stand: Mod assist         General transfer comment: ModA to boost hips to up full standing, Min guard to steady once standing with RW   Ambulation/Gait Ambulation/Gait assistance: Min guard Gait Distance (Feet): 1 Feet Assistive device: Rolling walker (2 wheeled) Gait Pattern/deviations: Step-to pattern;Decreased step length - right;Decreased step length - left;Decreased stride length;Trunk flexed;Antalgic     General Gait Details: took a couple of steps forward and backward in front of chair, limited by pain and fatigue; min guard for safety with RW   Stairs            Wheelchair Mobility    Modified Rankin (Stroke Patients Only)       Balance Overall balance assessment: Needs assistance;History of Falls Sitting-balance support: Bilateral upper extremity supported;Feet supported Sitting balance-Leahy Scale: Fair     Standing balance support: Bilateral upper extremity supported;During functional activity Standing balance-Leahy Scale: Poor Standing balance comment: reliant on B UE support                              Pertinent Vitals/Pain Pain Assessment: 0-10 Pain Score: 8  Pain Location: stomach  Pain Descriptors / Indicators: Aching;Discomfort;Nagging(patient declines PT asking RN about medication as she states she has already been told she cannot have pain meds until 2:30) Pain Intervention(s): Limited activity within patient's tolerance;Monitored during session    Home Living Family/patient expects to  be discharged to:: Private residence Living Arrangements: Alone Available Help at Discharge: Family;Available PRN/intermittently Type of Home: Apartment Home Access: Level entry     Home Layout: One level Home Equipment: Walker - 4 wheels;Shower seat;Bedside commode      Prior Function Level of Independence:  Independent with assistive device(s)         Comments: has been using RW, has had at least one fall      Hand Dominance        Extremity/Trunk Assessment   Upper Extremity Assessment Upper Extremity Assessment: Defer to OT evaluation    Lower Extremity Assessment Lower Extremity Assessment: Generalized weakness    Cervical / Trunk Assessment Cervical / Trunk Assessment: Kyphotic  Communication   Communication: No difficulties  Cognition Arousal/Alertness: Awake/alert Behavior During Therapy: WFL for tasks assessed/performed Overall Cognitive Status: Within Functional Limits for tasks assessed                                        General Comments General comments (skin integrity, edema, etc.): VSS on room air throughout session, SPO2 no lower than 90%     Exercises     Assessment/Plan    PT Assessment Patient needs continued PT services  PT Problem List Decreased strength;Decreased mobility;Decreased safety awareness;Decreased coordination;Decreased knowledge of precautions;Decreased activity tolerance;Decreased balance;Pain       PT Treatment Interventions DME instruction;Therapeutic activities;Gait training;Therapeutic exercise;Patient/family education;Stair training;Balance training;Functional mobility training;Neuromuscular re-education    PT Goals (Current goals can be found in the Care Plan section)  Acute Rehab PT Goals Patient Stated Goal: to feel better  PT Goal Formulation: With patient Time For Goal Achievement: 06/20/18 Potential to Achieve Goals: Good    Frequency Min 3X/week   Barriers to discharge        Co-evaluation               AM-PAC PT "6 Clicks" Daily Activity  Outcome Measure Difficulty turning over in bed (including adjusting bedclothes, sheets and blankets)?: Unable Difficulty moving from lying on back to sitting on the side of the bed? : Unable Difficulty sitting down on and standing up from a chair  with arms (e.g., wheelchair, bedside commode, etc,.)?: Unable Help needed moving to and from a bed to chair (including a wheelchair)?: A Little Help needed walking in hospital room?: A Little Help needed climbing 3-5 steps with a railing? : A Lot 6 Click Score: 11    End of Session   Activity Tolerance: Patient limited by fatigue;Patient limited by pain Patient left: in chair;with call bell/phone within reach   PT Visit Diagnosis: Unsteadiness on feet (R26.81);Muscle weakness (generalized) (M62.81);Difficulty in walking, not elsewhere classified (R26.2);Pain;History of falling (Z91.81) Pain - Right/Left: (abdominal ) Pain - part of body: (abdominal )    Time: 7425-9563 PT Time Calculation (min) (ACUTE ONLY): 21 min   Charges:   PT Evaluation $PT Eval Moderate Complexity: 1 Mod          Deniece Ree PT, DPT, CBIS  Supplemental Physical Therapist [place]    Pager [phone] Acute Rehab Office [phone]

## 2018-06-06 NOTE — Progress Notes (Signed)
Pt. Only urinated approximately 150 cc mixed with stool. Bladder scan performed showed 456 cc. NP Schorr text paged. Order received to do In and Out cath, which was done and got 400 cc out. Pt tolerated well.

## 2018-06-06 NOTE — Progress Notes (Addendum)
[facility]                                                                              Patient Demographics  [name], is a 67 y.o. female, DOB - [birth year], SFK:[id number]  Admit date - 05/28/2018   Admitting Physician Ivor Costa, MD  Outpatient Primary MD for the patient is Bernerd Limbo, MD  Outpatient specialists:   LOS - 9  days   Medical records reviewed and are as summarized below:    Chief Complaint  Patient presents with  . Abdominal Pain  . Emesis       Brief summary   Patient is a 67 year old female with hypertension, hyperlipidemia, diabetesCOPD on home oxygen, GERD, gout, depression with anxiety, tobacco abuse, atrial fibrillation not on anticoagulants, AAA, CAD, CABG, CHF, CKD-4, GI bleeding, mitral valve regurgitation, PAD who presented with abdominal pain. Patient was recently hospitalized from 8/4-8/16 due to GI bleeding required blood transfusion and gallstone pancreatitis.  Patient underwent laparoscopic cholecystectomy.  She was discharged in stable condition.  Patient presented back with nausea vomiting abdominal pain, poor oral intake.    Assessment & Plan    Principal Problem: Severe acute pancreatitis, recurrent versus unresolved, with abdominal pain, nausea and vomiting with pseudocyst -Autoimmune pancreatitis was ruled out, status post cholecystectomy last month on 8/14 -GI following closely, CT abdomen pelvis showed worsening severe inflammatory changes surrounding the pancreatic head and neck, descending duodenum, hepatic flexure of colon.  New complex fluid collection inferior to the gallbladder fossa with layering hyperdense material, favor worsening pancreatitis with enlarging pancreatic pseudocyst. -Leukocytosis trending up, on empiric Zosyn.  Will obtain blood cultures, change antibiotics to meropenem -Diet advance to full liquids, currently on tube feeds, ?  Nocturnal tube feeds to decrease volume overload  Active  Problems: Severe protein calorie malnutrition in the context of #1, chronic illness, diarrhea C. difficile negative, continue loperamide as needed.  Possibly from tube feeds  Hypoalbuminemia with bilateral upper extremity swelling nightly third spacing/anasarca -Albumin 1.6, bilateral upper extremities Doppler negative for DVT -Lasix 20 mg IV x1 was given on 9/29 - I's and O's with 14 L positive, creatinine up to 2.2 today -Will give albumin 25 g once followed by Lasix 20 mg IV x1, if creatinine improving, will need daily Lasix for a few days  Hypokalemia, hypomagnesemia, hypophosphatemia -Potassium, phosphorus elevated, discontinued Neutra-Phos -Potassium 5.2, hopefully will improve with Lasix today.   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   -Patient was seen by cardiology recommended outpatient stress test, not a candidate for any invasive procedures at this point.  -2D echo  showed normal LV function, moderate mitral stenosis, mild MR -Cardiology follow-up outpatient  History of AAA repair in 03/9023 complicated with retroperitoneal bleed  COPD, tobacco use -Currently no wheezing, continue nicotine patch  Nutrition, generalized debility, appears to be depressed -Placed on Remeron 7.5 mg at bedtime which will hopefully improve her appetite as well and wean tube feeds slowly  Code Status: full  DVT Prophylaxis:  SCD's Family  Communication: Discussed in detail with the patient, all imaging results, lab results explained to the patient   Disposition Plan: Needs PT OT, will be good candidate for LTAC Time Spent in minutes 25 minutes  Procedures:  CT abdomen 2D echo  Consultants:   GI Cardiology  Antimicrobials:      Medications  Scheduled Meds: . allopurinol  100 mg Oral Daily  . ALPRAZolam  0.5 mg Oral QHS  . atorvastatin  40 mg Oral QPM  . carvedilol  12.5 mg Oral BID WC  . cholecalciferol  5,000 Units Oral Daily  . cloNIDine  0.1 mg Oral TID  . clopidogrel  75 mg Oral Q breakfast  . DULoxetine  60 mg Oral Daily  . famotidine  20 mg Oral QAC supper  . feeding supplement (VITAL 1.5 CAL)  1,000 mL Per Tube Q24H  . fenofibrate  160 mg Oral QHS  . gabapentin  300 mg Oral BID  . heparin  5,000 Units Subcutaneous Q8H  . hydrALAZINE  100 mg Oral TID  . insulin aspart  0-9 Units Subcutaneous Q4H  . isosorbide mononitrate  15 mg Oral Daily  . lipase/protease/amylase  12,000 Units Oral TID AC  . mirtazapine  7.5 mg Oral QHS  . mometasone-formoterol  2 puff Inhalation BID  . nicotine  21 mg Transdermal Daily  . pantoprazole  40 mg Oral Q0600   Continuous Infusions: . sodium chloride Stopped (06/04/18 0016)  . lactated ringers 50 mL/hr at 06/04/18 0600  . meropenem (MERREM) IV 1 g (06/06/18 1044)   PRN Meds:.sodium chloride, acetaminophen **OR** acetaminophen, alum & mag hydroxide-simeth, hydrALAZINE, HYDROcodone-acetaminophen, HYDROmorphone (DILAUDID) injection, hydrOXYzine, levalbuterol, loperamide, ondansetron (ZOFRAN) IV, polyethylene glycol, zolpidem   Antibiotics   Anti-infectives (From admission, onward)   Start     Dose/Rate Route Frequency Ordered Stop   06/05/18 1445  meropenem (MERREM) 1 g in sodium chloride 0.9 % 100 mL IVPB     1 g 200 mL/hr over 30 Minutes Intravenous Every 12 hours 06/05/18 1433     05/29/18 0600  piperacillin-tazobactam (ZOSYN) IVPB 3.375 g     3.375 g 12.5  mL/hr over 240 Minutes Intravenous Every 8 hours 05/28/18 2227 06/05/18 1600   05/28/18 2230  piperacillin-tazobactam (ZOSYN) IVPB 3.375 g     3.375 g 100 mL/hr over 30 Minutes Intravenous  Once 05/28/18 2227 05/29/18 0011        Subjective:   Rayli Wiederhold was seen and examined today.  Looks ill, very frail.  Right-sided upper abdominal pain, no fevers or chills.  No vomiting.   Objective:   Vitals:   06/06/18 0332 06/06/18 0713 06/06/18 0915 06/06/18 1100  BP: (!) 124/47  (!) 131/41 (!) 128/48  Pulse: (!) 59  60 66  Resp: 18  18 18   Temp: 98.4 F (36.9 C)  98.1 F (36.7 C) 98.2 F (36.8 C)  TempSrc: Oral  Oral Oral  SpO2: 98% 98% 99% 98%  Weight:      Height:        Intake/Output Summary (Last 24 hours) at 06/06/2018 1331  Last data filed at 06/06/2018 1937 Gross per 24 hour  Intake 2260.46 ml  Output 800 ml  Net 1460.46 ml     Wt Readings from Last 3 Encounters:  05/30/18 56.6 kg  05/15/18 55.8 kg  04/28/18 60.3 kg     Exam   General: Ill-appearing, alert and awake but flat affect not very conversant Eyes:  HEENT:  Atraumatic, normocephalic, cortrack + Cardiovascular: S1 S2 auscultated, Regular rate and rhythm. Respiratory: Diminished breath sounds throughout Gastrointestinal: Soft, diffuse mild tenderness  + bowel sounds Ext: edema in bilateral upper extremities, trace in lower extremities Neuro:  Musculoskeletal: No digital cyanosis, clubbing Skin: No rashes Psych: ill-appearing, flat affect       Data Reviewed:  I have personally reviewed following labs and imaging studies  Micro Results Recent Results (from the past 240 hour(s))  Blood culture (routine x 2)     Status: None   Collection Time: 05/28/18  5:54 PM  Result Value Ref Range Status   Specimen Description BLOOD RIGHT ANTECUBITAL  Final   Special Requests   Final    BOTTLES DRAWN AEROBIC AND ANAEROBIC Blood Culture results may not be optimal due to an excessive volume of blood  received in culture bottles   Culture   Final    NO GROWTH 5 DAYS Performed at [facility], [place] [address], [place], [place] [address]    Report Status 06/02/2018 FINAL  Final  Blood culture (routine x 2)     Status: None   Collection Time: 05/28/18  6:35 PM  Result Value Ref Range Status   Specimen Description BLOOD BLOOD RIGHT FOREARM  Final   Special Requests   Final    BOTTLES DRAWN AEROBIC ONLY Blood Culture results may not be optimal due to an inadequate volume of blood received in culture bottles   Culture   Final    NO GROWTH 5 DAYS Performed at [facility], [place] [address], [place], [place] [address]    Report Status 06/02/2018 FINAL  Final  MRSA PCR Screening     Status: None   Collection Time: 05/30/18  2:53 PM  Result Value Ref Range Status   MRSA by PCR NEGATIVE NEGATIVE Final    Comment:        The GeneXpert MRSA Assay (FDA approved for NASAL specimens only), is one component of a comprehensive MRSA colonization surveillance program. It is not intended to diagnose MRSA infection nor to guide or monitor treatment for MRSA infections. Performed at [facility], [place] [address] [place], [place] [address]   C difficile quick scan w PCR reflex     Status: None   Collection Time: 05/31/18  9:11 AM  Result Value Ref Range Status   C Diff antigen NEGATIVE NEGATIVE Final   C Diff toxin NEGATIVE NEGATIVE Final   C Diff interpretation No C. difficile detected.  Final    Comment: Performed at [facility], [place] [address], [place], [place] [address]  Culture, blood (routine x 2)     Status: None (Preliminary result)   Collection Time: 06/05/18  2:45 PM  Result Value Ref Range Status   Specimen Description BLOOD RIGHT HAND  Final   Special Requests   Final    BOTTLES DRAWN AEROBIC ONLY Blood Culture adequate volume   Culture   Final    NO GROWTH < 24 HOURS Performed at [facility], [place] [address], [place], [place] [address]  Report  Status PENDING  Incomplete  Culture, blood (routine x 2)     Status: None (Preliminary result)   Collection Time: 06/05/18  2:53 PM  Result Value Ref Range Status   Specimen Description BLOOD RIGHT HAND  Final   Special Requests   Final    BOTTLES DRAWN AEROBIC ONLY Blood Culture adequate volume   Culture   Final    NO GROWTH < 24 HOURS Performed at [facility], [address], [place], [place] [address]    Report Status PENDING  Incomplete    Radiology Reports Ct Abdomen Pelvis Wo Contrast  Result Date: 05/28/2018 CLINICAL DATA:  New tiny right lung nodule on chest radiograph. Severe right lower quadrant abdominal pain with nausea, vomiting and diarrhea. Chronic kidney disease. Prior cholecystectomy 04/19/2018, hysterectomy and appendectomy. Prior bowel resection. EXAM: CT CHEST, ABDOMEN AND PELVIS WITHOUT CONTRAST TECHNIQUE: Multidetector CT imaging of the chest, abdomen and pelvis was performed following the standard protocol without IV contrast. COMPARISON:  Chest radiograph from earlier today. FINDINGS: CT CHEST FINDINGS Cardiovascular: Borderline mild cardiomegaly. No significant pericardial effusion/thickening. 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 Atherosclerotic nonaneurysmal thoracic aorta. Proximal left subclavian artery stent is noted. Dilated main pulmonary artery (4.0 cm diameter). Mediastinum/Nodes: No discrete thyroid nodules. Unremarkable esophagus. No axillary adenopathy. Mild right paratracheal adenopathy up to 1.1 cm (series 3/image 21). Mildly enlarged 1.1 cm subcarinal node (series 3/image 26). No discrete hilar adenopathy on this noncontrast scan. Lungs/Pleura: No pneumothorax. No pleural effusion. A few tiny calcified granulomas are present in the upper lobes bilaterally. No acute consolidative airspace disease, lung masses or significant pulmonary nodules. Diffuse interlobular septal thickening. Musculoskeletal: No aggressive appearing  focal osseous lesions. Intact sternotomy wires. Moderate thoracic spondylosis. CT ABDOMEN PELVIS FINDINGS Hepatobiliary: Normal liver with no liver mass. Cholecystectomy. Bile ducts are within normal post cholecystectomy limits with CBD diameter 7 mm. Pancreas: Thickening of the pancreatic head and neck. Prominent peripancreatic fat stranding and ill-defined fluid in the pancreatic head and neck, worsened. Poorly defined 5.1 x 2.5 cm anterior pancreatic head complex fluid collection with heterogeneous internal density (series 3/image 66), increased from 2.3 x 1.2 cm. No definite pancreatic duct dilation. Severe wall thickening throughout the adjacent descending duodenum, worsened. Fluid collection inferior to the gallbladder fossa measures 5.0 x 3.0 cm (series 3/image 71) and demonstrates layering hyperdense material, new. Spleen: Normal size. No mass. Adrenals/Urinary Tract: Normal adrenals. No hydronephrosis. No renal stones. Partially exophytic simple 1.3 cm posterior interpolar left renal cyst. No additional contour deforming renal lesions. Normal bladder. Stomach/Bowel: Normal non-distended stomach. Normal caliber small bowel with no small bowel wall thickening. Appendectomy. New segmental wall thickening in the hepatic flexure of the colon. No additional sites of large bowel wall thickening. Stable postsurgical changes from distal large bowel resection. Vascular/Lymphatic: Atherosclerotic abdominal aorta with stable 5.0 cm infrarenal abdominal aortic aneurysm status post aorto bi-iliac stent graft repair. Surgical clips are noted in the right inguinal region. No pathologically enlarged lymph nodes in the abdomen or pelvis. Reproductive: Status post hysterectomy, with no abnormal findings at the vaginal cuff. No adnexal mass. Other: No pneumoperitoneum.  New small volume perihepatic ascites. Musculoskeletal: No aggressive appearing focal osseous lesions. Mild lumbar spondylosis. IMPRESSION: CT CHEST: 1. No lung  masses or significant pulmonary nodules. 2. Borderline mild cardiomegaly. Diffuse interlobular septal thickening suggests mild pulmonary edema. No pleural effusions. 3. Dilated main pulmonary artery, suggesting pulmonary arterial hypertension. CT ABDOMEN PELVIS: 1. Worsening severe inflammatory changes  surrounding the pancreatic head and neck, descending duodenum and hepatic flexure of the colon. Enlarging nonspecific complex poorly defined fluid collection in the anterior pancreatic head. New complex fluid collection inferior to the gallbladder fossa with layering hyperdense material. Burtis Junes worsening pancreatitis with enlarging pancreatic pseudocysts. Marked wall thickening in the descending duodenum has worsened, and could be reactive, with peptic ulcer disease difficult to exclude. No free air. Short-term follow-up MRI or CT abdomen with IV contrast suggested. 2. New wall thickening throughout the hepatic flexure of the colon, probably reactive. 3. Cholecystectomy. No intrahepatic biliary ductal dilatation. CBD diameter 7 mm, within normal post cholecystectomy limits. 4. New small volume perihepatic ascites. 5.  Aortic Atherosclerosis (ICD10-I70.0). Electronically Signed   By: Ilona Sorrel M.D.   On: 05/28/2018 20:41   Ct Abdomen Pelvis Wo Contrast  Result Date: 05/16/2018 CLINICAL DATA:  Acute abdominal pain.  Cholecystectomy last month. EXAM: CT ABDOMEN AND PELVIS WITHOUT CONTRAST TECHNIQUE: Multidetector CT imaging of the abdomen and pelvis was performed following the standard protocol without IV contrast. COMPARISON:  Multiple prior exams most recent CT 04/18/2018 FINDINGS: Lower chest: Improved pleural effusions with small right greater than left residual. Mild septal thickening at the lung bases. Coronary artery calcifications. Mild cardiomegaly. Hepatobiliary: No focal hepatic lesion on noncontrast exam. Postcholecystectomy with clips in the gallbladder fossa. Small hyperdensity in the gallbladder  fossa may be cystic duct remnant with small stone. No biliary dilatation. Pancreas: Similar appearance of peripancreatic fat stranding with stranding involving the region of the duodenum. Previously seen fluid collections in the pancreatic head are not definitively visualized, lack of IV contrast limits detailed assessment. No evidence of progressive fluid collection. No ductal dilatation. Spleen: Normal in size. Adrenals/Urinary Tract: Mild left adrenal thickening without dominant nodule. Right adrenal gland is normal. Calcifications of both renal hila may be vascular or nonobstructing stones, grossly unchanged from prior exam. No hydronephrosis. Small low-density lesion arising from the left kidney, likely cysts. Urinary bladder is nondistended and not well evaluated. Stomach/Bowel: Again seen wall thickening about the distal stomach and duodenum, not well assessed in the absence of contrast. Degree of stranding has slightly increased from prior exam. No other small or large bowel inflammation or wall thickening. Enteric sutures in the sigmoid colon. Prior appendectomy. Vascular/Lymphatic: Aorto bi-iliac stent graft. Residual aneurysmal diameter of 5.1 cm, unchanged. Stent at the origin of both renal arteries. Aortic and peripheral atherosclerosis. Diffuse atherosclerosis of external iliac arteries. Reproductive: Status post hysterectomy. No adnexal masses. Other: Fluid collection in the left retroperitoneum inferior to the left kidney and lateral to the psoas muscle currently measures 4.5 x 3.4 cm, previously 5.5 x 4 cm. No internal air. Upper abdominal stranding and edema. No frank ascites. No free air. Musculoskeletal: There are no acute or suspicious osseous abnormalities. IMPRESSION: 1. Persistent fat stranding and poorly defined fat planes in the region of the pancreatic head and duodenum. Favor persistent or recurrent pancreatitis over duodenitis. Previous small fluid collections in the region of the  pancreatic head are not currently visualized and may have resolved, lack of contrast limits assessment. If the patient can tolerate breath hold technique, pancreatic protocol MRI may be of value to further assess. 2. Interval cholecystectomy. Small hyperdensity in the gallbladder fossa may be gallstone within the cystic duct remnant, no evidence of inflammatory change. 3. Decreasing size of left retroperitoneal hematoma. 4. Decreased bilateral pleural effusions from prior exam with mild residual. Septal thickening in the lung bases suspicious for pulmonary edema. 5. Aorto bi-iliac  stent graft repair of abdominal aortic aneurysm with unchanged residual aortic diameter. Diffuse aortic and branch atherosclerosis. Aortic Atherosclerosis (ICD10-I70.0). 6. Electronically Signed   By: Keith Rake M.D.   On: 05/16/2018 07:07   Ct Chest Wo Contrast  Result Date: 05/28/2018 CLINICAL DATA:  New tiny right lung nodule on chest radiograph. Severe right lower quadrant abdominal pain with nausea, vomiting and diarrhea. Chronic kidney disease. Prior cholecystectomy 04/19/2018, hysterectomy and appendectomy. Prior bowel resection. EXAM: CT CHEST, ABDOMEN AND PELVIS WITHOUT CONTRAST TECHNIQUE: Multidetector CT imaging of the chest, abdomen and pelvis was performed following the standard protocol without IV contrast. COMPARISON:  Chest radiograph from earlier today. FINDINGS: CT CHEST FINDINGS Cardiovascular: Borderline mild cardiomegaly. No significant pericardial effusion/thickening. 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 Atherosclerotic nonaneurysmal thoracic aorta. Proximal left subclavian artery stent is noted. Dilated main pulmonary artery (4.0 cm diameter). Mediastinum/Nodes: No discrete thyroid nodules. Unremarkable esophagus. No axillary adenopathy. Mild right paratracheal adenopathy up to 1.1 cm (series 3/image 21). Mildly enlarged 1.1 cm subcarinal node (series 3/image 26). No discrete  hilar adenopathy on this noncontrast scan. Lungs/Pleura: No pneumothorax. No pleural effusion. A few tiny calcified granulomas are present in the upper lobes bilaterally. No acute consolidative airspace disease, lung masses or significant pulmonary nodules. Diffuse interlobular septal thickening. Musculoskeletal: No aggressive appearing focal osseous lesions. Intact sternotomy wires. Moderate thoracic spondylosis. CT ABDOMEN PELVIS FINDINGS Hepatobiliary: Normal liver with no liver mass. Cholecystectomy. Bile ducts are within normal post cholecystectomy limits with CBD diameter 7 mm. Pancreas: Thickening of the pancreatic head and neck. Prominent peripancreatic fat stranding and ill-defined fluid in the pancreatic head and neck, worsened. Poorly defined 5.1 x 2.5 cm anterior pancreatic head complex fluid collection with heterogeneous internal density (series 3/image 66), increased from 2.3 x 1.2 cm. No definite pancreatic duct dilation. Severe wall thickening throughout the adjacent descending duodenum, worsened. Fluid collection inferior to the gallbladder fossa measures 5.0 x 3.0 cm (series 3/image 71) and demonstrates layering hyperdense material, new. Spleen: Normal size. No mass. Adrenals/Urinary Tract: Normal adrenals. No hydronephrosis. No renal stones. Partially exophytic simple 1.3 cm posterior interpolar left renal cyst. No additional contour deforming renal lesions. Normal bladder. Stomach/Bowel: Normal non-distended stomach. Normal caliber small bowel with no small bowel wall thickening. Appendectomy. New segmental wall thickening in the hepatic flexure of the colon. No additional sites of large bowel wall thickening. Stable postsurgical changes from distal large bowel resection. Vascular/Lymphatic: Atherosclerotic abdominal aorta with stable 5.0 cm infrarenal abdominal aortic aneurysm status post aorto bi-iliac stent graft repair. Surgical clips are noted in the right inguinal region. No pathologically  enlarged lymph nodes in the abdomen or pelvis. Reproductive: Status post hysterectomy, with no abnormal findings at the vaginal cuff. No adnexal mass. Other: No pneumoperitoneum.  New small volume perihepatic ascites. Musculoskeletal: No aggressive appearing focal osseous lesions. Mild lumbar spondylosis. IMPRESSION: CT CHEST: 1. No lung masses or significant pulmonary nodules. 2. Borderline mild cardiomegaly. Diffuse interlobular septal thickening suggests mild pulmonary edema. No pleural effusions. 3. Dilated main pulmonary artery, suggesting pulmonary arterial hypertension. CT ABDOMEN PELVIS: 1. Worsening severe inflammatory changes surrounding the pancreatic head and neck, descending duodenum and hepatic flexure of the colon. Enlarging nonspecific complex poorly defined fluid collection in the anterior pancreatic head. New complex fluid collection inferior to the gallbladder fossa with layering hyperdense material. Burtis Junes worsening pancreatitis with enlarging pancreatic pseudocysts. Marked wall thickening in the descending duodenum has worsened, and could be reactive, with peptic ulcer disease difficult to  exclude. No free air. Short-term follow-up MRI or CT abdomen with IV contrast suggested. 2. New wall thickening throughout the hepatic flexure of the colon, probably reactive. 3. Cholecystectomy. No intrahepatic biliary ductal dilatation. CBD diameter 7 mm, within normal post cholecystectomy limits. 4. New small volume perihepatic ascites. 5.  Aortic Atherosclerosis (ICD10-I70.0). Electronically Signed   By: Ilona Sorrel M.D.   On: 05/28/2018 20:41   Dg Chest Portable 1 View  Result Date: 05/28/2018 CLINICAL DATA:  Severe right lower quadrant pain. EXAM: PORTABLE CHEST 1 VIEW COMPARISON:  04/18/2018 FINDINGS: The lungs are clear without focal pneumonia, edema, pneumothorax or pleural effusion. Interstitial markings are diffusely coarsened with chronic features. Tiny nodular density identified right upper  lung, superimposed on anterior right second rib. Patient is status post median sternotomy. Left atrial appendage occluded device noted. Vascular stent superimposed on the transverse aorta. The visualized bony structures of the thorax are intact. IMPRESSION: Cardiomegaly with mild vascular congestion or chronic interstitial coarsening. Tiny nodule right upper lung not visible on prior studies and indeterminate. CT chest without contrast could be used to further evaluate. Electronically Signed   By: Misty Stanley M.D.   On: 05/28/2018 17:23   Dg Abd 2 Views  Result Date: 05/30/2018 CLINICAL DATA:  History of pancreatitis.  Abdominal pain. EXAM: ABDOMEN - 2 VIEW COMPARISON:  Chest radiograph 05/29/2018 FINDINGS: Feeding tube tip projects at the gastric antrum/proximal duodenum, unchanged. Aortic stent graft material. Monitoring leads overlie the patient. Gas is demonstrated within nondilated loops of large and small bowel. Small left pleural effusion. Heterogeneous opacities left lung base. IMPRESSION: Feeding tube projects at the gastric antrum/proximal duodenum, unchanged. Decreased gaseous distention of the stomach and small bowel. Electronically Signed   By: Lovey Newcomer M.D.   On: 05/30/2018 10:49   Dg Abd Portable 1v  Result Date: 05/29/2018 CLINICAL DATA:  Feeding tube placement EXAM: PORTABLE ABDOMEN - 1 VIEW COMPARISON:  None. FINDINGS: Gaseous distension of the stomach and proximal small bowel. Feeding tube with the tip projecting over antrum versus proximal duodenum. No evidence of pneumoperitoneum, portal venous gas or pneumatosis. No pathologic calcifications along the expected course of the ureters. Aorto bi-iliac stent graft is noted. Left renal artery stent is noted. Osseous structures are unremarkable. IMPRESSION: Gaseous distension of the stomach and proximal small bowel. Feeding tube with the tip projecting over antrum versus proximal duodenum. Electronically Signed   By: Kathreen Devoid   On:  05/29/2018 15:30    Lab Data:  CBC: Recent Labs  Lab 06/02/18 1610 06/03/18 0526 06/04/18 0537 06/05/18 0538 06/06/18 0551  WBC 12.7* 13.7* 13.3* 15.1* 13.6*  HGB 8.8* 8.4* 8.1* 8.2* 7.6*  HCT 27.6* 25.9* 26.0* 26.1* 24.5*  MCV 96.5 96.3 98.9 98.5 98.4  PLT 202 236 241 261 960   Basic Metabolic Panel: Recent Labs  Lab 06/02/18 0608 06/03/18 0526 06/04/18 0537 06/05/18 0538 06/06/18 0551  NA 137 135 135 136 135  K 4.3 4.2 4.8 5.0 5.2*  CL 99 95* 94* 93* 94*  CO2 28 29 30 28  32  GLUCOSE 119* 142* 137* 149* 133*  BUN 36* 42* 45* 51* 55*  CREATININE 1.66* 1.74* 1.85* 2.07* 2.23*  CALCIUM 7.7* 7.7* 8.0* 8.4* 8.1*  MG 2.4 2.0 2.0 1.7 1.7  PHOS 2.7 4.1 5.5* 5.7* 6.3*   GFR: Estimated Creatinine Clearance: 22.5 mL/min (A) (by C-G formula based on SCr of 2.23 mg/dL (H)). Liver Function Tests: Recent Labs  Lab 06/02/18 [phone] 06/03/18 0526 06/04/18 0537 06/05/18  2035 06/06/18 0551  AST 16 19 16 16 16   ALT 6 8 8 9 9   ALKPHOS 41 41 36* 37* 33*  BILITOT 0.6 0.5 0.6 0.5 0.4  PROT 4.8* 5.1* 4.6* 4.8* 4.9*  ALBUMIN 1.7* 1.7* 1.6* 1.7* 1.6*   Recent Labs  Lab 06/02/18 5974 06/03/18 0526 06/04/18 0537 06/05/18 0538 06/06/18 0551  LIPASE 106* 103* 93* 176* 118*   No results for input(s): AMMONIA in the last 168 hours. Coagulation Profile: No results for input(s): INR, PROTIME in the last 168 hours. Cardiac Enzymes: No results for input(s): CKTOTAL, CKMB, CKMBINDEX, TROPONINI in the last 168 hours. BNP (last 3 results) No results for input(s): PROBNP in the last 8760 hours. HbA1C: No results for input(s): HGBA1C in the last 72 hours. CBG: Recent Labs  Lab 06/05/18 1958 06/05/18 2333 06/06/18 0330 06/06/18 0734 06/06/18 1127  GLUCAP 193* 147* 144* 129* 173*   Lipid Profile: No results for input(s): CHOL, HDL, LDLCALC, TRIG, CHOLHDL, LDLDIRECT in the last 72 hours. Thyroid Function Tests: No results for input(s): TSH, T4TOTAL, FREET4, T3FREE, THYROIDAB in  the last 72 hours. Anemia Panel: No results for input(s): VITAMINB12, FOLATE, FERRITIN, TIBC, IRON, RETICCTPCT in the last 72 hours. Urine analysis:    Component Value Date/Time   COLORURINE YELLOW 05/28/2018 0506   APPEARANCEUR HAZY (A) 05/28/2018 0506   LABSPEC 1.021 05/28/2018 0506   PHURINE 5.0 05/28/2018 0506   GLUCOSEU NEGATIVE 05/28/2018 0506   HGBUR NEGATIVE 05/28/2018 0506   BILIRUBINUR NEGATIVE 05/28/2018 0506   KETONESUR 5 (A) 05/28/2018 0506   PROTEINUR NEGATIVE 05/28/2018 0506   UROBILINOGEN 0.2 03/04/2011 2107   NITRITE NEGATIVE 05/28/2018 0506   LEUKOCYTESUR SMALL (A) 05/28/2018 0506     Ripudeep Rai M.D. [facility] 06/06/2018, 1:31 PM  Pager: [phone] Between 7am to 7pm - call Pager - [phone]  After 7pm go to [email or web address] - password [email or web address]  Call night coverage person covering after 7pm

## 2018-06-06 NOTE — Progress Notes (Signed)
Nutrition Follow-up  DOCUMENTATION CODES:   Severe malnutrition in context of chronic illness  INTERVENTION:   Tube Feeding:  Transition to nocturnal TF Change to Vital 1.5 @ 80 ml/hr for 14 hours Provides 1680 kcals, 76 g of protein  Meets 90% protein needs, >90% calorie needs  No new weight recently, request new weight   NUTRITION DIAGNOSIS:   Severe Malnutrition related to chronic illness(CHF, AAA s/p graft repair, pancreatitis) as evidenced by severe muscle depletion, moderate muscle depletion, mild fat depletion, moderate fat depletion, energy intake < or equal to 75% for > or equal to 1 month.  Being addressed via nutrition support, supplements  GOAL:   Patient will meet greater than or equal to 90% of their needs  progressing  MONITOR:   Diet advancement, Labs, TF tolerance, I & O's, Weight trends  REASON FOR ASSESSMENT:   Consult Enteral/tube feeding initiation and management  ASSESSMENT:   Patient with PMH significant for HTN, HLD, DM, COPD, GERD, gout, depression with anxiety, tobacco abuse, AAA s/p stent graft repair 01/2018, CAD, CABG, CHF, CKD IV s/p AV fistula for anticipated transition to HD, GI bleeding, mitral valve regurgitation, PAD, and s/p lap chole 04/19/18. Presents this admission with abdominal pain. Admitted for recurrent vs never resolved acute pancreatitis and GIB.   Pt reports she does not feel well today; appears weak Pt has been on TF via Cortrak since 9/23 Diet advanced to FL on 9/30. Pt reports she ate some grits, yogurt and jello this AM. Pt reports abdominal pain associated with meals but not with continuousTF.  Pt does not complain of abdominal fullness, lack of appetite related to TF. However, plan to change TF at this time to nocturnal feeding to optimize po intake during the day  Hyperphosphatemia but pt had been receiving phosphorus supplementation for hypophosphatemia until yesterday. Potassium also up but K-phos and Kcl  supplementation contributing Plan to monitor electrolytes, do not recommend changng to renal TF formula at present  Pt reports loose stool continues but not as frequent Pt denies N/V  No new weight since admission, request new weight  Labs: phosphorus 6.3 (H), potassium 5.2, Creatinine 2.23, BUN  55, sodium wdl Meds: remeron, Creon, cholecalciferol  Diet Order:   Diet Order            Diet full liquid Room service appropriate? Yes; Fluid consistency: Thin  Diet effective now              EDUCATION NEEDS:   Not appropriate for education at this time  Skin:  Skin Assessment: Reviewed RN Assessment  Last BM:  10/1  Height:   Ht Readings from Last 1 Encounters:  05/30/18 5\' 5"  (1.651 m)    Weight:   Wt Readings from Last 1 Encounters:  05/30/18 56.6 kg    Ideal Body Weight:  56.8 kg  BMI:  Body mass index is 20.76 kg/m.  Estimated Nutritional Needs:   Kcal:  1700-1900 kcal  Protein:  85-100 g  Fluid:  >/= 1.7 L/day   Kerman Passey MS, RD, LDN, CNSC [phone] Pager  [phone] Weekend/On-Call Pager

## 2018-06-06 NOTE — Care Management Note (Signed)
Case Management Note  Patient Details  Name: [name] MRN: [id number] Date of Birth: [birth year]  Subjective/Objective:                    Action/Plan:  Left message for [name] with [facility] regarding LTAC . Spoke with [name] with [facility] regarding LTAC. Awaiting call backs. Expected Discharge Date:                  Expected Discharge Plan:  [place]  In-House Referral:     Discharge planning Services  CM Consult  Post Acute Care Choice:  Home Health Choice offered to:  Patient, Adult Children  DME Arranged:  N/A DME Agency:  NA  HH Arranged:  RN, PT, OT, Nurse's Aide, Social Work [employer] Agency:  [place]  Status of Service:  In process, will continue to follow  If discussed at Long Length of Stay Meetings, dates discussed:    Additional Comments:  Amy Favre, RN 06/06/2018, 4:35 PM

## 2018-06-06 NOTE — Progress Notes (Addendum)
Daily Rounding Note  06/06/2018, 8:50 AM  LOS: 9 days   SUBJECTIVE:   Chief complaint: abdominal pain, continues.  Using 4 to 5 mg Dilaudid yesterday.  Note she chronically used 15 mg Vicodin daily, long-term, for back pain.  No nausea, tolerating FL but minimal intake of tray provided foods.      3, soft, non-bloody BM's yesterday.   On 2 in-out caths in last 48 hours has had 600 plus and 400 plus of retained urine. + 14.5 litres since 9/22, 4.7 litres positive in last 72 hours.     OBJECTIVE:         Vital signs in last 24 hours:    Temp:  [97.9 F (36.6 C)-98.5 F (36.9 C)] 98.4 F (36.9 C) (10/01 0332) Pulse Rate:  [59-60] 59 (10/01 0332) Resp:  [18] 18 (10/01 0332) BP: (123-129)/(43-49) 124/47 (10/01 0332) SpO2:  [97 %-98 %] 98 % (10/01 0713) Last BM Date: 06/06/18 Filed Weights   05/29/18 2048 05/30/18 1653  Weight: 56.6 kg 56.6 kg   General: looks severely ill.  Not uncomfortable   Heart: RRR Chest: clear bil but overall dimished BS.  No labored breathing or cough Abdomen: tenderness throughout.  No G/R.  Active BS  Extremities: edema in right arm and left foot Neuro/Psych:  Depressed, flat affect.  Oriented x 3.  Paucity of speech.  Moves all 4 limbs, no tremor.  Strength not tested.    Intake/Output from previous day: 09/30 0701 - 10/01 0700 In: 2650.5 [P.O.:820; NG/GT:1430; IV Piggyback:400.5] Out: 9242 [Urine:1053]  Intake/Output this shift: Total I/O In: -  Out: 400 [Urine:400]  Lab Results: Recent Labs    06/04/18 0537 06/05/18 0538 06/06/18 0551  WBC 13.3* 15.1* 13.6*  HGB 8.1* 8.2* 7.6*  HCT 26.0* 26.1* 24.5*  PLT 241 261 269   BMET Recent Labs    06/04/18 0537 06/05/18 0538 06/06/18 0551  NA 135 136 135  K 4.8 5.0 5.2*  CL 94* 93* 94*  CO2 30 28 32  GLUCOSE 137* 149* 133*  BUN 45* 51* 55*  CREATININE 1.85* 2.07* 2.23*  CALCIUM  8.0* 8.4* 8.1*   LFT Recent Labs    06/04/18 0537 06/05/18 0538 06/06/18 0551  PROT 4.6* 4.8* 4.9*  ALBUMIN 1.6* 1.7* 1.6*  AST 16 16 16   ALT 8 9 9   ALKPHOS 36* 37* 33*  BILITOT 0.6 0.5 0.4   Scheduled Meds: . allopurinol  100 mg Oral Daily  . ALPRAZolam  0.5 mg Oral QHS  . atorvastatin  40 mg Oral QPM  . carvedilol  12.5 mg Oral BID WC  . cholecalciferol  5,000 Units Oral Daily  . cloNIDine  0.1 mg Oral TID  . clopidogrel  75 mg Oral Q breakfast  . DULoxetine  60 mg Oral Daily  . famotidine  20 mg Oral QAC supper  . fenofibrate  160 mg Oral QHS  . furosemide  20 mg Intravenous Once  . gabapentin  300 mg Oral BID  . heparin  5,000 Units Subcutaneous Q8H  . hydrALAZINE  100 mg Oral TID  . insulin aspart  0-9 Units Subcutaneous Q4H  .  isosorbide mononitrate  15 mg Oral Daily  . lipase/protease/amylase  12,000 Units Oral TID AC  . mirtazapine  7.5 mg Oral QHS  . mometasone-formoterol  2 puff Inhalation BID  . nicotine  21 mg Transdermal Daily  . pantoprazole  40 mg Oral Q0600   Continuous Infusions: . sodium chloride Stopped (06/04/18 0016)  . albumin human    . feeding supplement (OSMOLITE 1.2 CAL) 1,000 mL (06/06/18 0744)  . lactated ringers 50 mL/hr at 06/04/18 0600  . meropenem (MERREM) IV Stopped (06/06/18 0345)   PRN Meds:.sodium chloride, acetaminophen **OR** acetaminophen, alum & mag hydroxide-simeth, hydrALAZINE, HYDROcodone-acetaminophen, HYDROmorphone (DILAUDID) injection, hydrOXYzine, levalbuterol, loperamide, ondansetron (ZOFRAN) IV, polyethylene glycol, zolpidem   ASSESMENT:   *  Severe acute pancreatitis with developing pseudocysts per latest non-con CT 9/22.   Cholecystectomy 04/19/18 in conjunction with initial pancreatitis presentation.  Dr Tana Coast switched to meropenem 9/30 given the rising WBCs (though today they are better).    *   Advanced CKD stage 5, unable to receive contrast for CT or MRI.   Progressing/worsening BUN/Creatinine.  Hyperkalemia  today.    *   Urinary retention.  Foley to be placed.    *  Diarrhea, resolved with Creon and   *   Diastolic CHF.  Positive fluid balance.    *   Protein malnutrition, exacerbated by pancreatitis.  Tolerating TF.  Poor appetite.    *   Anemia.  Normocytic.  Transfused 1 U PRBC 9/26. On oral iron.     *   AAA repair 01/2018.     PLAN   *   ? Switch to nocturnal TF in effort to increase day time appetitie and po intake?  *  Dr Tana Coast contemplating initiation of Remeron to induce appetite and treat depression.    *   Decision to transfuse per Dr Tana Coast, since she is already volume overloaded, agree it is prudent to hold off PRBCs until Hgb </= to 7.  Follow daily CBC.    *  Needs PT/OT to work on strengthening.   *  With her advanced CKD wonder if she ought to be getting epo and or Feraheme. However, labs in 04/10/18 did not support iron, b12, or folate deficiency    Azucena Freed  06/06/2018, 8:50 AM Phone [phone]   Attending physician's note   I have taken an interval history, reviewed the chart and examined the patient. I agree with the Advanced Practitioner's note, impression and recommendations.  Severe pancreatitis with necrosis and complex pseudocyst Patient complains of worsening abdominal pain if she tries to eat anything If she is not tolerating full liquid diet, will need to go back to clears Do not think patient ready for discharge yet, she is still requiring IV pain medication  K. Denzil Magnuson , MD [phone]

## 2018-06-07 ENCOUNTER — Inpatient Hospital Stay (HOSPITAL_COMMUNITY): Payer: Medicare Other

## 2018-06-07 DIAGNOSIS — R601 Generalized edema: Secondary | ICD-10-CM

## 2018-06-07 DIAGNOSIS — I248 Other forms of acute ischemic heart disease: Secondary | ICD-10-CM

## 2018-06-07 LAB — CBC
HEMATOCRIT: 25.7 % — AB (ref 36.0–46.0)
Hemoglobin: 8.2 g/dL — ABNORMAL LOW (ref 12.0–15.0)
MCH: 31.4 pg (ref 26.0–34.0)
MCHC: 31.9 g/dL (ref 30.0–36.0)
MCV: 98.5 fL (ref 78.0–100.0)
PLATELETS: 339 10*3/uL (ref 150–400)
RBC: 2.61 MIL/uL — AB (ref 3.87–5.11)
RDW: 17.4 % — ABNORMAL HIGH (ref 11.5–15.5)
WBC: 13.7 10*3/uL — AB (ref 4.0–10.5)

## 2018-06-07 LAB — RETICULOCYTES
RBC.: 2.61 MIL/uL — ABNORMAL LOW (ref 3.87–5.11)
Retic Count, Absolute: 80.9 10*3/uL (ref 19.0–186.0)
Retic Ct Pct: 3.1 % (ref 0.4–3.1)

## 2018-06-07 LAB — FERRITIN: FERRITIN: 1466 ng/mL — AB (ref 11–307)

## 2018-06-07 LAB — GLUCOSE, CAPILLARY
GLUCOSE-CAPILLARY: 144 mg/dL — AB (ref 70–99)
GLUCOSE-CAPILLARY: 154 mg/dL — AB (ref 70–99)
GLUCOSE-CAPILLARY: 168 mg/dL — AB (ref 70–99)
GLUCOSE-CAPILLARY: 195 mg/dL — AB (ref 70–99)
GLUCOSE-CAPILLARY: 85 mg/dL (ref 70–99)
Glucose-Capillary: 111 mg/dL — ABNORMAL HIGH (ref 70–99)

## 2018-06-07 LAB — COMPREHENSIVE METABOLIC PANEL
ALBUMIN: 2 g/dL — AB (ref 3.5–5.0)
ALK PHOS: 39 U/L (ref 38–126)
ALT: 10 U/L (ref 0–44)
AST: 19 U/L (ref 15–41)
Anion gap: 9 (ref 5–15)
BUN: 60 mg/dL — ABNORMAL HIGH (ref 8–23)
CALCIUM: 8.2 mg/dL — AB (ref 8.9–10.3)
CHLORIDE: 95 mmol/L — AB (ref 98–111)
CO2: 31 mmol/L (ref 22–32)
Creatinine, Ser: 2.47 mg/dL — ABNORMAL HIGH (ref 0.44–1.00)
GFR calc Af Amer: 22 mL/min — ABNORMAL LOW (ref >60)
GFR calc non Af Amer: 19 mL/min — ABNORMAL LOW (ref >60)
Glucose, Bld: 150 mg/dL — ABNORMAL HIGH (ref 70–99)
Potassium: 5.2 mmol/L — ABNORMAL HIGH (ref 3.5–5.1)
SODIUM: 135 mmol/L (ref 135–145)
Total Bilirubin: 0.6 mg/dL (ref 0.3–1.2)
Total Protein: 5.1 g/dL — ABNORMAL LOW (ref 6.5–8.1)

## 2018-06-07 LAB — LIPASE, BLOOD: Lipase: 202 U/L — ABNORMAL HIGH (ref 11–51)

## 2018-06-07 LAB — IRON AND TIBC
IRON: 45 ug/dL (ref 28–170)
Saturation Ratios: 16 % (ref 10.4–31.8)
TIBC: 286 ug/dL (ref 250–450)
UIBC: 241 ug/dL

## 2018-06-07 LAB — VITAMIN B12: Vitamin B-12: 264 pg/mL (ref 180–914)

## 2018-06-07 LAB — FOLATE: Folate: 11.8 ng/mL (ref >5.9)

## 2018-06-07 LAB — MAGNESIUM: Magnesium: 1.7 mg/dL (ref 1.7–2.4)

## 2018-06-07 MED ORDER — PANCRELIPASE (LIP-PROT-AMYL) 10440-39150 UNITS PO TABS
20880.0000 [IU] | ORAL_TABLET | Freq: Once | ORAL | Status: AC
  Administered 2018-06-07: 20880 [IU]
  Filled 2018-06-07 (×2): qty 2

## 2018-06-07 MED ORDER — FUROSEMIDE 10 MG/ML IJ SOLN
20.0000 mg | Freq: Two times a day (BID) | INTRAMUSCULAR | Status: DC
  Administered 2018-06-07 – 2018-06-09 (×4): 20 mg via INTRAVENOUS
  Filled 2018-06-07 (×5): qty 2

## 2018-06-07 MED ORDER — SODIUM BICARBONATE 650 MG PO TABS
650.0000 mg | ORAL_TABLET | Freq: Once | ORAL | Status: AC
  Administered 2018-06-07: 650 mg
  Filled 2018-06-07: qty 1

## 2018-06-07 NOTE — Progress Notes (Signed)
PROGRESS NOTE  [name] WER:[id number] DOB: [birth year] DOA: 05/28/2018 PCP: Bernerd Limbo, MD  Brief Narrative: 67 year old woman PMH gallstone pancreatitis August 2019, status post laparoscopic cholecystectomy same hospitalization, presented with abdominal pain, nausea, vomiting.  Admitted for recurrent pancreatitis.  She was seen by gastroenterology, cortrak tube was placed and she was started on enteral feeds.  Assessment/Plan Acute pancreatitis, recurrent versus smoldering, severe inflammatory changes noted, new complex fluid collection on CT concerning for pseudocyst, necrosis, SIRS on admission.  History of gallstone pancreatitis August 2019 at which time patient underwent laparoscopic cholecystectomy.  Autoimmune pancreatitis ruled out. --Continue management as directed by GI including tube feeds --Continue antibiotics given known fluid collection  Anasarca secondary to hypoalbuminemia, aggressive volume resuscitation, transfusion, underlying chronic diastolic CHF on Lasix as an outpatient, with held on admission. +14 L since admission. --Restart Lasix, IV, monitor urine output closely, Place Foley if necessary, check renal ultrasound to rule out complicating features  Chronic diastolic CHF managed with Lasix as an outpatient --Plan as above.  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  Colonoscopy planned October 28.  Was not iron deficient August. --Improved status post transfusion. --Follow clinically.  CKD stage IV --Appears stable.  BUN increasing.  Wonder if there is a prerenal component.  Plan as above.  Atrial fibrillation.  CHA2DS2-VASc 6. Not on anticoagulation.  CAD, PMH CABG. --Stable.  COPD, chronic hypoxic respiratory failure on oxygen at home --Appears stable.  Diarrhea.  None today.  Suspect secondary to enteral feeding.  C. difficile was negative.  Demand ischemia.  PMH CAD, CABG. --Cardiology recommended medical management,  consider outpatient stress test.  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  Last A1c 5.05 Jan 2018. --CBG stable.  FTT --  Severe malnutrition --Tube feeds per nutrition.  Aortic atherosclerosis --No inpatient evaluation indicated.   Guarded prognosis. Management per GI.  Treat anasarca and follow kidney function.  DVT prophylaxis: heparin Code Status: full Family Communication: daughter [name] by telephone Disposition Plan: pending    Murray Hodgkins, MD  [facility] Direct contact: [phone] --Via amion app OR  --[email or web address]; password [email or web address]  7PM-7AM contact night coverage as above 06/07/2018, 3:05 PM  LOS: 10 days   Consultants:  GI  Cardiology  Procedures:  cortrak feeding tube placement on 9/23  Transfusion 1 unit PRBC  Echo Study Conclusions  - Left ventricle: The cavity size was mildly dilated. There was   mild focal basal hypertrophy of the septum. Systolic function was   normal. The estimated ejection fraction was in the range of 60%   to 65%. Wall motion was normal; there were no regional wall   motion abnormalities. Features are consistent with a pseudonormal   left ventricular filling pattern, with concomitant abnormal   relaxation and increased filling pressure (grade 2 diastolic   dysfunction). Doppler parameters are consistent with high   ventricular filling pressure. - Mitral valve: S/P mitral valve repair with moderate mitral   stenosis. There was mild regurgitation. Mean gradient (D): 9 mm   Hg. Valve area by pressure half-time: 2.24 cm^2. Valve area by   continuity equation (using LVOT flow): 1.17 cm^2. - Left atrium: The atrium was mildly dilated. - Pulmonary arteries: PA peak pressure: 36 mm Hg (S).  Impressions:  - Normal LVF with mild BSH, mild MR and TR, mild LVE, grade 3   diastolic dysfunction.. S/P mitral valve repair with moderate MS.   No significant change from prior echo The  right  ventricular   systolic pressure was increased consistent with mild pulmonary   hypertension.  Antimicrobials:  Merrem  Interval history/Subjective: Feels about the same; still has significant abd pain; no vomiting. Tolerating liquids. BUE/BLE edema about the same.  Objective: Vitals:  Vitals:   06/07/18 0825 06/07/18 1000  BP:  (!) 150/48  Pulse:  64  Resp:  18  Temp:  98.5 F (36.9 C)  SpO2: 96% 98%    Exam:  Constitutional:  . Appears calm and comfortable Eyes:  . pupils and irises appear normal . Normal lids  ENMT:  . grossly normal hearing  . NGT in place Respiratory:  . CTA bilaterally, no w/r/r.  . Respiratory effort normal.  Cardiovascular:  . RRR, no m/r/g . 2+ BUE, BLE extremity edema   Abdomen:  . Positive BS, soft Psychiatric:  . Mental status . Mood affect, appropriate   I have personally reviewed the following:   Data: . CBG stable . Potassium 5.2, no significant change. . BUN increased, creatinine somewhat elevated although baseline appears to be around 2-2 0.5. . Magnesium within normal limits. . Lipase without significant change, 202 . WBC without significant change, 13.7.  Hemoglobin stable 8.2.    Scheduled Meds: . allopurinol  100 mg Oral Daily  . ALPRAZolam  0.5 mg Oral QHS  . atorvastatin  40 mg Oral QPM  . carvedilol  12.5 mg Oral BID WC  . cholecalciferol  5,000 Units Oral Daily  . cloNIDine  0.1 mg Oral TID  . clopidogrel  75 mg Oral Q breakfast  . DULoxetine  60 mg Oral Daily  . famotidine  20 mg Oral QAC supper  . feeding supplement (VITAL 1.5 CAL)  1,000 mL Per Tube Q24H  . fenofibrate  160 mg Oral QHS  . gabapentin  300 mg Oral BID  . heparin  5,000 Units Subcutaneous Q8H  . hydrALAZINE  100 mg Oral TID  . insulin aspart  0-9 Units Subcutaneous Q4H  . isosorbide mononitrate  15 mg Oral Daily  . lipase/protease/amylase  12,000 Units Oral TID AC  . mirtazapine  7.5 mg Oral QHS  . mometasone-formoterol  2 puff  Inhalation BID  . nicotine  21 mg Transdermal Daily  . pantoprazole  40 mg Oral Q0600   Continuous Infusions: . sodium chloride 250 mL (06/07/18 1203)  . lactated ringers 50 mL/hr at 06/04/18 0600  . meropenem (MERREM) IV 1 g (06/07/18 1204)    Principal Problem:   Pancreatitis, recurrent Active Problems:   TOBACCO ABUSE   Essential hypertension   Atrial fibrillation (HCC)   COPD (chronic obstructive pulmonary disease) (HCC)   S/P CABG x 3   Anemia   GERD (gastroesophageal reflux disease)   Hyperlipidemia   CAD (coronary artery disease)   Chronic kidney disease (CKD), stage IV (severe) (HCC)   Chronic diastolic congestive heart failure (HCC)   Type II diabetes mellitus with renal manifestations (HCC)   Elevated troponin   Hypertensive urgency   Hypokalemia   SIRS (systemic inflammatory response syndrome) (HCC)   Protein-calorie malnutrition, severe   LOS: 10 days

## 2018-06-07 NOTE — Care Management Note (Signed)
Case Management Note  Patient Details  Name: CHRISS REDEL MRN: [id number] Date of Birth: 17-Sep-1950  Subjective/Objective:                    Action/Plan: Spoke with Corinna with [facility] and Raquel Sarna with [facility] , neither can offer LTAC bed. Patient's insurance requires patient have 3 midnight stay in ICU on same admission.  Expected Discharge Date:                  Expected Discharge Plan:  [place]  In-House Referral:     Discharge planning Services  CM Consult  Post Acute Care Choice:  Home Health Choice offered to:  Patient, Adult Children  DME Arranged:  N/A DME Agency:  NA  HH Arranged:  RN, PT, OT, Nurse's Aide, Social Work [employer] Agency:  [place]  Status of Service:  In process, will continue to follow  If discussed at Long Length of Stay Meetings, dates discussed:    Additional Comments:  [name], RN 06/07/2018, 8:45 AM

## 2018-06-07 NOTE — Progress Notes (Signed)
Patient daughter, [name] called regarding concern for patients recent weight gain and kidney function. MD Sarajane Jews notified

## 2018-06-08 DIAGNOSIS — E875 Hyperkalemia: Secondary | ICD-10-CM

## 2018-06-08 LAB — COMPREHENSIVE METABOLIC PANEL
ALBUMIN: 2.1 g/dL — AB (ref 3.5–5.0)
ALK PHOS: 32 U/L — AB (ref 38–126)
ALT: 9 U/L (ref 0–44)
ANION GAP: 13 (ref 5–15)
AST: 23 U/L (ref 15–41)
BUN: 61 mg/dL — ABNORMAL HIGH (ref 8–23)
CALCIUM: 9 mg/dL (ref 8.9–10.3)
CO2: 29 mmol/L (ref 22–32)
Chloride: 94 mmol/L — ABNORMAL LOW (ref 98–111)
Creatinine, Ser: 2.59 mg/dL — ABNORMAL HIGH (ref 0.44–1.00)
GFR calc Af Amer: 21 mL/min — ABNORMAL LOW (ref >60)
GFR calc non Af Amer: 18 mL/min — ABNORMAL LOW (ref >60)
GLUCOSE: 130 mg/dL — AB (ref 70–99)
Potassium: 5.5 mmol/L — ABNORMAL HIGH (ref 3.5–5.1)
SODIUM: 136 mmol/L (ref 135–145)
Total Bilirubin: 0.8 mg/dL (ref 0.3–1.2)
Total Protein: 5.3 g/dL — ABNORMAL LOW (ref 6.5–8.1)

## 2018-06-08 LAB — LIPASE, BLOOD: LIPASE: 677 U/L — AB (ref 11–51)

## 2018-06-08 LAB — GLUCOSE, CAPILLARY
GLUCOSE-CAPILLARY: 142 mg/dL — AB (ref 70–99)
Glucose-Capillary: 111 mg/dL — ABNORMAL HIGH (ref 70–99)
Glucose-Capillary: 112 mg/dL — ABNORMAL HIGH (ref 70–99)
Glucose-Capillary: 130 mg/dL — ABNORMAL HIGH (ref 70–99)
Glucose-Capillary: 82 mg/dL (ref 70–99)
Glucose-Capillary: 92 mg/dL (ref 70–99)

## 2018-06-08 MED ORDER — SODIUM BICARBONATE 650 MG PO TABS
650.0000 mg | ORAL_TABLET | Freq: Once | ORAL | Status: DC
  Filled 2018-06-08: qty 1

## 2018-06-08 MED ORDER — SODIUM CHLORIDE 0.9 % IV SOLN: INTRAVENOUS | Status: DC

## 2018-06-08 MED ORDER — PANCRELIPASE (LIP-PROT-AMYL) 10440-39150 UNITS PO TABS
20880.0000 [IU] | ORAL_TABLET | Freq: Once | ORAL | Status: DC
  Filled 2018-06-08: qty 2

## 2018-06-08 NOTE — Progress Notes (Signed)
Physical Therapy Treatment Patient Details Name: [name] MRN: [id number] DOB: [birth year] Today's Date: 06/08/2018    History of Present Illness 67yo female with c/o of abdominal pain. Note recent hospitalization 04/09/18 to 04/21/18 with GI bleed, gallstone pancreatitis, and during which she received laparoscopic cholecystectomy. Diagnosed with recurrent pancreatitis. PMH AAA, CHF, chronic back pain, CKD, COPD, GI bleed, HTN, MI, A-fib, DM, cardiac cath, CABG, maze procedure, mitral valve repair, R femoral-popliteal bypass     PT Comments    Patient received in bed, pleasant and willing to participate in PT session today. Able to complete functional bed mobility with ModA and needed only min guard to scoot to EOB once upright at bedside. She was able to perform functional transfers with [place] initially fading to [place] with extended time with RW on second attempt. Able to gait train approximately 62f total with RW, close min guard, frequent episodes of knees buckling but able to support self with UEs on RW. She declines up to chair as it increases her abdominal pain, and was left in bed with all needs met, bed alarm activated this morning. Continue to strongly recommend some form of in-patient rehab as from PT perspective patient would be unsafe to return home alone; if she does not qualify for LTACH, strongly recommend ST-SNF moving forward.    Follow Up Recommendations  SNF;LTACH     Equipment Recommendations  Other (comment)(defer to next venue )    Recommendations for Other Services       Precautions / Restrictions Precautions Precautions: Fall;Other (comment) Precaution Comments: watch SPO2/HR  Restrictions Weight Bearing Restrictions: No    Mobility  Bed Mobility Overal bed mobility: Needs Assistance Bed Mobility: Supine to Sit;Sit to Supine     Supine to sit: Mod assist Sit to supine: Mod assist   General bed mobility comments: ModA for LEs and to bring trunk up at EOB    Transfers Overall transfer level: Needs assistance Equipment used: Rolling walker (2 wheeled) Transfers: Sit to/from Stand Sit to Stand: Min assist         General transfer comment: MinA to come to full standing position, min guard once upright with RW   Ambulation/Gait Ambulation/Gait assistance: Min guard Gait Distance (Feet): 25 Feet Assistive device: Rolling walker (2 wheeled) Gait Pattern/deviations: Step-to pattern;Decreased step length - right;Decreased step length - left;Decreased stride length;Trunk flexed;Antalgic     General Gait Details: very limited by pain and fatigue today, had frequent episodes of knees buckling but able to remain upright with close min guard and B UE support   Stairs             Wheelchair Mobility    Modified Rankin (Stroke Patients Only)       Balance Overall balance assessment: Needs assistance;History of Falls Sitting-balance support: Bilateral upper extremity supported;Feet supported Sitting balance-Leahy Scale: Fair     Standing balance support: Bilateral upper extremity supported;During functional activity Standing balance-Leahy Scale: Poor Standing balance comment: reliant on B UE support                             Cognition Arousal/Alertness: Awake/alert Behavior During Therapy: WFL for tasks assessed/performed Overall Cognitive Status: Within Functional Limits for tasks assessed  Exercises      General Comments        Pertinent Vitals/Pain Pain Assessment: 0-10 Pain Score: 7  Pain Location: stomach  Pain Descriptors / Indicators: Aching;Discomfort;Nagging Pain Intervention(s): Limited activity within patient's tolerance;Monitored during session;Premedicated before session    Home Living                      Prior Function            PT Goals (current goals can now be found in the care plan section) Acute Rehab PT  Goals Patient Stated Goal: to feel better  PT Goal Formulation: With patient Time For Goal Achievement: 06/20/18 Potential to Achieve Goals: Good Progress towards PT goals: Progressing toward goals    Frequency    Min 3X/week      PT Plan Current plan remains appropriate    Co-evaluation              AM-PAC PT "6 Clicks" Daily Activity  Outcome Measure  Difficulty turning over in bed (including adjusting bedclothes, sheets and blankets)?: Unable Difficulty moving from lying on back to sitting on the side of the bed? : Unable Difficulty sitting down on and standing up from a chair with arms (e.g., wheelchair, bedside commode, etc,.)?: Unable Help needed moving to and from a bed to chair (including a wheelchair)?: A Little Help needed walking in hospital room?: A Little Help needed climbing 3-5 steps with a railing? : A Lot 6 Click Score: 11    End of Session   Activity Tolerance: Patient limited by fatigue;Patient limited by pain Patient left: in bed;with bed alarm set;with call bell/phone within reach   PT Visit Diagnosis: Unsteadiness on feet (R26.81);Muscle weakness (generalized) (M62.81);Difficulty in walking, not elsewhere classified (R26.2);Pain;History of falling (Z91.81) Pain - Right/Left: (stomach ) Pain - part of body: (stomach )     Time: 0982-8675 PT Time Calculation (min) (ACUTE ONLY): 23 min  Charges:  $Gait Training: 8-22 mins $Therapeutic Activity: 8-22 mins                     Deniece Ree PT, DPT, CBIS  Supplemental Physical Therapist [place]    Pager [phone] Acute Rehab Office [phone]

## 2018-06-08 NOTE — Progress Notes (Signed)
Pharmacy Antibiotic Note  [name] is a 67 y.o. female admitted on 05/28/2018 with intraabdominal infection.  Pharmacy has been consulted for meropenem dosing. Patient afebrile, last WBC 13.7. Scr continues to trend up.   Plan: Meropenem 1000mg  IV q12h Monitor for deescalation and clinical course. Monitor for renal function   Height: 5\' 5"  (165.1 cm) Weight: 153 lb (69.4 kg) IBW/kg (Calculated) : 57  Temp (24hrs), Avg:98.3 F (36.8 C), Min:98.1 F (36.7 C), Max:98.7 F (37.1 C)  Recent Labs  Lab 06/03/18 0526 06/04/18 0537 06/05/18 0538 06/06/18 0551 06/07/18 0452  WBC 13.7* 13.3* 15.1* 13.6* 13.7*  CREATININE 1.74* 1.85* 2.07* 2.23* 2.47*    Estimated Creatinine Clearance: 22.2 mL/min (A) (by C-G formula based on SCr of 2.47 mg/dL (H)).    Allergies  Allergen Reactions  . Isosorbide Other (See Comments)    Can only tolerate in low doses  . Isosorbide Nitrate Other (See Comments)    Can only tolerate in low doses  . Oxycodone-Acetaminophen Itching  . Codeine Itching  . Contrast Media [Iodinated Diagnostic Agents] Itching and Other (See Comments)    Itching of feet  . Ioxaglate Itching and Other (See Comments)    Itching of feet  . Metrizamide Itching and Other (See Comments)    Itching of feet  . Percocet [Oxycodone-Acetaminophen] Itching and Other (See Comments)    Tolerates Hydrocodone    Antimicrobials this admission: Zosyn 9/22 >> 9/30 Meropenem 9/30 >>   Blood cultures NGTD  [name] A. Levada Dy, PharmD, [place] Pager: [phone] Please utilize Amion for appropriate phone number to reach the unit pharmacist ([place])   06/08/2018 9:04 AM

## 2018-06-08 NOTE — Progress Notes (Signed)
Cortrak found to be clogged at 6pm 10/2.  Clogged feeding tube protocol initiated. Night shift RN unable to unclog feeding tube and night coverage made aware.

## 2018-06-08 NOTE — Progress Notes (Addendum)
PROGRESS NOTE  [name] VCB:[id number] DOB: 04-02-1951 DOA: 05/28/2018 PCP: Bernerd Limbo, MD  Brief Narrative: 67 year old woman PMH gallstone pancreatitis August 2019, status post laparoscopic cholecystectomy same hospitalization, presented with abdominal pain, nausea, vomiting.  Admitted for recurrent pancreatitis.  She was seen by gastroenterology, cortrak tube was placed and she was started on enteral feeds.  Assessment/Plan Acute pancreatitis, recurrent versus smoldering, severe inflammatory changes noted, new complex fluid collection on CT concerning for pseudocyst, necrosis, SIRS on admission.  History of gallstone pancreatitis August 2019 at which time patient underwent laparoscopic cholecystectomy.  Autoimmune pancreatitis ruled out. --Lipase increased today.  Patient reports some pain with liquids.   --Stop liquids, make n.p.o. except for medications  --Continue management per GI including tube feeds  --Continue empiric antibiotics.    Anasarca secondary to hypoalbuminemia, aggressive volume resuscitation, transfusion, underlying chronic diastolic CHF on Lasix as an outpatient, with held on admission. +15 L since admission, however some voids unrecorded and lower extremity edema does appear better. --Third spacing secondary to pancreatitis, aggressive volume resuscitation, hypoalbuminemia.  Renal function modestly worse today.  Renal ultrasound was unremarkable.  We will continue Lasix and follow urine output and renal function.  Will place Foley catheter for diuresis, intermittent retention, accurate I/O.  Modest hyperkalemia, not on any potassium.  Appears related to renal function.  --Continue Lasix. --BMP in a.m.  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  Colonoscopy planned October 28.  Was not iron deficient August. --Stable thus far status post  transfusion  CKD stage IV --May have an element of AKI although creatinine appears close to baseline.  BUN elevating, no hydronephrosis on renal ultrasound but patient with intermittent difficulty voiding.   --Given worsening renal function, Place Foley catheter. --If renal function fails to improve, will ask nephrology to see 10/4  Atrial fibrillation.  CHA2DS2-VASc 6. Not on anticoagulation.  CAD, PMH CABG. --Remains stable.  COPD, chronic hypoxic respiratory failure on oxygen at home --Appears stable.  Diarrhea.  None today.  Suspect secondary to enteral feeding.  C. difficile was negative.  Demand ischemia.  PMH CAD, CABG. --Cardiology recommended medical management, consider outpatient stress test.  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  Last A1c 5.05 Jan 2018. --CBG remains stable.  FTT --continue TF  Severe malnutrition --Tube feeds per nutrition to resume when Cortrak patency resotred  Aortic atherosclerosis --No inpatient evaluation indicated.   Prognosis remains guarded.  Make n.p.o. as above.  DVT prophylaxis: heparin Code Status: full Family Communication: discussed in detail with daughter [name] by telephone, guarded prognosis, not improving, may not improve. Will involve palliative medicine for support, daughter agrees. Disposition Plan: pending    Murray Hodgkins, MD  [facility] Direct contact: [phone] --Via amion app OR  --[email or web address]; password [email or web address]  7PM-7AM contact night coverage as above 06/08/2018, 2:32 PM  LOS: 11 days   Consultants:  GI  Cardiology  Procedures:  cortrak feeding tube placement on 9/23  Transfusion 1 unit PRBC  Echo Study Conclusions  - Left ventricle: The cavity size was mildly dilated. There was   mild focal basal hypertrophy of the septum. Systolic function was   normal. The estimated ejection fraction was in the range of 60%   to 65%. Wall motion was normal; there  were no regional wall   motion abnormalities. Features are consistent with a pseudonormal   left ventricular filling pattern,  with concomitant abnormal   relaxation and increased filling pressure (grade 2 diastolic   dysfunction). Doppler parameters are consistent with high   ventricular filling pressure. - Mitral valve: S/P mitral valve repair with moderate mitral   stenosis. There was mild regurgitation. Mean gradient (D): 9 mm   Hg. Valve area by pressure half-time: 2.24 cm^2. Valve area by   continuity equation (using LVOT flow): 1.17 cm^2. - Left atrium: The atrium was mildly dilated. - Pulmonary arteries: PA peak pressure: 36 mm Hg (S).  Impressions:  - Normal LVF with mild BSH, mild MR and TR, mild LVE, grade 3   diastolic dysfunction.. S/P mitral valve repair with moderate MS.   No significant change from prior echo The right ventricular   systolic pressure was increased consistent with mild pulmonary   hypertension.  Antimicrobials:  Merrem  Interval history/Subjective: Swelling has improved a bit.  Has some abdominal discomfort when eating.  No vomiting.  Objective: Vitals:  Vitals:   06/08/18 0743 06/08/18 0900  BP: (!) 167/58   Pulse: 78   Resp: 18   Temp: 98.1 F (36.7 C)   SpO2: 95% 96%    Exam: Constitutional:   . Appears calm, uncomfortable, non-toxic Respiratory:  . CTA bilaterally, no w/r/r.  . Respiratory effort normal.  Cardiovascular:  . RRR, no m/r/g . 2+ BLE extremity edema, somewhat less   Abdomen:  . soft Psychiatric:  . Mental status o Mood, affect appropriate  I have personally reviewed the following:   Data: . CBG stable. . Potassium without significant change, 5.5.  Creatinine somewhat worse today, 2.59.  BUN 61. . Lipase significantly higher today, 677, AST, ALT within normal limits.  Total bilirubin and alkaline phosphatase within normal limits.    Scheduled Meds: . allopurinol  100 mg Oral Daily  . ALPRAZolam  0.5 mg  Oral QHS  . atorvastatin  40 mg Oral QPM  . carvedilol  12.5 mg Oral BID WC  . cholecalciferol  5,000 Units Oral Daily  . cloNIDine  0.1 mg Oral TID  . clopidogrel  75 mg Oral Q breakfast  . DULoxetine  60 mg Oral Daily  . famotidine  20 mg Oral QAC supper  . feeding supplement (VITAL 1.5 CAL)  1,000 mL Per Tube Q24H  . fenofibrate  160 mg Oral QHS  . furosemide  20 mg Intravenous BID  . gabapentin  300 mg Oral BID  . hydrALAZINE  100 mg Oral TID  . insulin aspart  0-9 Units Subcutaneous Q4H  . isosorbide mononitrate  15 mg Oral Daily  . lipase/protease/amylase  12,000 Units Oral TID AC  . lipase/protease/amylase)  20,880 Units Per Tube Once   And  . sodium bicarbonate  650 mg Per Tube Once  . mirtazapine  7.5 mg Oral QHS  . mometasone-formoterol  2 puff Inhalation BID  . nicotine  21 mg Transdermal Daily  . pantoprazole  40 mg Oral Q0600   Continuous Infusions: . sodium chloride 250 mL (06/08/18 1103)  . meropenem (MERREM) IV 1 g (06/08/18 1104)    Principal Problem:   Pancreatitis, recurrent Active Problems:   TOBACCO ABUSE   Essential hypertension   Atrial fibrillation (HCC)   COPD (chronic obstructive pulmonary disease) (HCC)   S/P CABG x 3   Anemia   GERD (gastroesophageal reflux disease)   Hyperlipidemia   CAD (coronary artery disease)   Chronic kidney disease (CKD), stage IV (severe) (HCC)   Chronic diastolic congestive heart failure ([place])  Type II diabetes mellitus with renal manifestations (HCC)   SIRS (systemic inflammatory response syndrome) (HCC)   Protein-calorie malnutrition, severe   Anasarca   Demand ischemia (HCC)   Hyperkalemia   LOS: 11 days

## 2018-06-09 ENCOUNTER — Inpatient Hospital Stay (HOSPITAL_COMMUNITY): Payer: Medicare Other

## 2018-06-09 DIAGNOSIS — Z515 Encounter for palliative care: Secondary | ICD-10-CM

## 2018-06-09 DIAGNOSIS — N179 Acute kidney failure, unspecified: Secondary | ICD-10-CM

## 2018-06-09 LAB — CBC
HCT: 26.6 % — ABNORMAL LOW (ref 36.0–46.0)
Hemoglobin: 8.3 g/dL — ABNORMAL LOW (ref 12.0–15.0)
MCH: 31.4 pg (ref 26.0–34.0)
MCHC: 31.2 g/dL (ref 30.0–36.0)
MCV: 100.8 fL — AB (ref 78.0–100.0)
Platelets: 357 10*3/uL (ref 150–400)
RBC: 2.64 MIL/uL — AB (ref 3.87–5.11)
RDW: 17.4 % — ABNORMAL HIGH (ref 11.5–15.5)
WBC: 13 10*3/uL — AB (ref 4.0–10.5)

## 2018-06-09 LAB — URINALYSIS, ROUTINE W REFLEX MICROSCOPIC
Bacteria, UA: NONE SEEN
Bilirubin Urine: NEGATIVE
GLUCOSE, UA: NEGATIVE mg/dL
HGB URINE DIPSTICK: NEGATIVE
KETONES UR: NEGATIVE mg/dL
Nitrite: NEGATIVE
PH: 8 (ref 5.0–8.0)
PROTEIN: NEGATIVE mg/dL
Specific Gravity, Urine: 1.006 (ref 1.005–1.030)

## 2018-06-09 LAB — LIPASE, BLOOD: Lipase: 179 U/L — ABNORMAL HIGH (ref 11–51)

## 2018-06-09 LAB — GLUCOSE, CAPILLARY
GLUCOSE-CAPILLARY: 78 mg/dL (ref 70–99)
GLUCOSE-CAPILLARY: 73 mg/dL (ref 70–99)
GLUCOSE-CAPILLARY: 190 mg/dL — AB (ref 70–99)
Glucose-Capillary: 85 mg/dL (ref 70–99)
Glucose-Capillary: 85 mg/dL (ref 70–99)
Glucose-Capillary: 89 mg/dL (ref 70–99)
Glucose-Capillary: 69 mg/dL — ABNORMAL LOW (ref 70–99)
Glucose-Capillary: 155 mg/dL — ABNORMAL HIGH (ref 70–99)

## 2018-06-09 LAB — BASIC METABOLIC PANEL
Anion gap: 13 (ref 5–15)
BUN: 61 mg/dL — ABNORMAL HIGH (ref 8–23)
CHLORIDE: 96 mmol/L — AB (ref 98–111)
CO2: 29 mmol/L (ref 22–32)
CREATININE: 2.72 mg/dL — AB (ref 0.44–1.00)
Calcium: 9.2 mg/dL (ref 8.9–10.3)
GFR calc non Af Amer: 17 mL/min — ABNORMAL LOW (ref >60)
GFR, EST AFRICAN AMERICAN: 20 mL/min — AB (ref >60)
Glucose, Bld: 78 mg/dL (ref 70–99)
POTASSIUM: 5.5 mmol/L — AB (ref 3.5–5.1)
SODIUM: 138 mmol/L (ref 135–145)

## 2018-06-09 MED ORDER — DARBEPOETIN ALFA 40 MCG/0.4ML IJ SOSY
40.0000 ug | PREFILLED_SYRINGE | Freq: Once | INTRAMUSCULAR | Status: AC
  Administered 2018-06-09: 40 ug via SUBCUTANEOUS
  Filled 2018-06-09: qty 0.4

## 2018-06-09 MED ORDER — PATIROMER SORBITEX CALCIUM 8.4 G PO PACK
8.4000 g | PACK | Freq: Once | ORAL | Status: DC
  Filled 2018-06-09: qty 1

## 2018-06-09 MED ORDER — VITAL AF 1.2 CAL PO LIQD
1000.0000 mL | ORAL | Status: DC
  Administered 2018-06-09 – 2018-06-10 (×2): 1000 mL
  Filled 2018-06-09 (×3): qty 1000

## 2018-06-09 MED ORDER — DEXTROSE 50 % IV SOLN
INTRAVENOUS | Status: AC
  Administered 2018-06-09: 50 mL
  Filled 2018-06-09: qty 50

## 2018-06-09 MED ORDER — LORAZEPAM 2 MG/ML IJ SOLN
0.5000 mg | Freq: Once | INTRAMUSCULAR | Status: AC
  Administered 2018-06-09: 0.5 mg via INTRAVENOUS
  Filled 2018-06-09: qty 1

## 2018-06-09 MED ORDER — SODIUM CHLORIDE 0.9 % IV SOLN
125.0000 mg | Freq: Every day | INTRAVENOUS | Status: AC
  Administered 2018-06-09 – 2018-06-12 (×4): 125 mg via INTRAVENOUS
  Filled 2018-06-09 (×7): qty 10

## 2018-06-09 NOTE — Progress Notes (Addendum)
PROGRESS NOTE  [name]:[id number] DOB: [birth year] DOA: 05/28/2018 PCP: Bernerd Limbo, MD  Brief Narrative: 67 year old woman PMH gallstone pancreatitis August 2019, status post laparoscopic cholecystectomy same hospitalization, presented with abdominal pain, nausea, vomiting.  Admitted for recurrent pancreatitis.  She was seen by gastroenterology, cortrak tube was placed and she was started on enteral feeds.  Assessment/Plan Acute pancreatitis, recurrent versus smoldering, severe inflammatory changes noted, new complex fluid collection on CT concerning for pseudocyst, necrosis, SIRS on admission.  History of gallstone pancreatitis August 2019 at which time patient underwent laparoscopic cholecystectomy.  Autoimmune pancreatitis ruled out. --Lipase has decreased but she is having continual pain.  Keep n.p.o.  I have paged GI to reassess for further recommendations.  Will repeat abdominal radiograph to assess the placement of Cortrak--see 9/23 note where RN d/w GI S. Gribbon, tube not post-pyloric but TF started. --Will discuss above with Cortrak team, may need a new tube. --No evidence of infection.  Blood cultures 9/22 no growth, final.  Blood cultures 9/30 no growth 4 days.  Persistent leukocytosis likely related to pancreatitis rather than infection.  She has had 13 days of antibiotics.  Will stop antibiotics 10/5 to complete 14-day course.  AKI on CKD stage IV with modest hyperkalemia, stable --Etiology unclear.  Urinalysis and renal ultrasound unremarkable.  Not on any likely contributing medications. --daily BMP --Persistent hyperkalemia, modest in nature, asymptomatic.  No apparent offending medications.  Suspect related to renal function.  Nephrology consultation pending.  Anasarca secondary to hypoalbuminemia, aggressive volume resuscitation, transfusion, underlying chronic diastolic CHF on Lasix as an outpatient, with held on admission. +14 L since admission. --Edema improving  with Lasix but renal function worsening.   --Will continue Foley catheter.  Will consult nephrology today for further recommendations.  Chronic diastolic CHF managed with Lasix as an outpatient --Appears stable at this time.  Volume overload likely related to aggressive volume earlier in hospitalization.  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  Colonoscopy planned October 28.  Was not iron deficient August. --Remains stable thus far status post transfusion  Atrial fibrillation.  CHA2DS2-VASc 6. Not on anticoagulation.  CAD, PMH CABG. --Stable.  COPD, chronic hypoxic respiratory failure on oxygen at home --Appears stable.  Diarrhea.  Secondary to tube feeds.  Demand ischemia.  PMH CAD, CABG. --Cardiology recommended medical management, consider outpatient stress test.  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  Last A1c 5.05 Jan 2018. --CBG remains stable.  FTT --Tube feeds on hold given dysfunction of tube currently.  Team will assess today.  Severe malnutrition --Tube feeds per nutrition to resume when Cortrak patency resolved  Aortic atherosclerosis --No inpatient evaluation indicated.   Prognosis is guarded giving ongoing abdominal pain.  Will assess tube placement and ask GI for further comment and management.  Nephrology consultation placed for worsening renal function.  Palliative medicine consultation placed for assistance with goals of care and symptom management.  Patient remains ill and her prognosis is guarded, I discussed with daughter that she may not survive this hospitalization and daughter was understanding of this.  DVT prophylaxis: SCDs Code Status: full Family Communication: As above Disposition Plan: pending    Murray Hodgkins, MD  [facility] Direct contact: [phone] --Via amion app OR  --[email or web address]; password [email or web address]  7PM-7AM contact night coverage as above 06/09/2018, 10:50 AM  LOS: 12  days   Consultants:  GI  Cardiology  Procedures:  cortrak feeding tube placement on 9/23  Transfusion 1 unit  PRBC  Echo Study Conclusions  - Left ventricle: The cavity size was mildly dilated. There was   mild focal basal hypertrophy of the septum. Systolic function was   normal. The estimated ejection fraction was in the range of 60%   to 65%. Wall motion was normal; there were no regional wall   motion abnormalities. Features are consistent with a pseudonormal   left ventricular filling pattern, with concomitant abnormal   relaxation and increased filling pressure (grade 2 diastolic   dysfunction). Doppler parameters are consistent with high   ventricular filling pressure. - Mitral valve: S/P mitral valve repair with moderate mitral   stenosis. There was mild regurgitation. Mean gradient (D): 9 mm   Hg. Valve area by pressure half-time: 2.24 cm^2. Valve area by   continuity equation (using LVOT flow): 1.17 cm^2. - Left atrium: The atrium was mildly dilated. - Pulmonary arteries: PA peak pressure: 36 mm Hg (S).  Impressions:  - Normal LVF with mild BSH, mild MR and TR, mild LVE, grade 3   diastolic dysfunction.. S/P mitral valve repair with moderate MS.   No significant change from prior echo The right ventricular   systolic pressure was increased consistent with mild pulmonary   hypertension.  Antimicrobials:  Merrem  Interval history/Subjective: Swelling improving; has constant abd pain. Breathing ok.   Objective: Vitals:  Vitals:   06/09/18 0736 06/09/18 0901  BP: (!) 170/52   Pulse: 65   Resp: 18   Temp: 98 F (36.7 C)   SpO2: 93% 94%    Exam: Constitutional:   . Appears calm, uncomfortable, non-toxic Eyes:  . pupils and irises appear normal . Normal lids  ENMT:  . grossly normal hearing  Respiratory:  . CTA bilaterally, no w/r/r.  . Respiratory effort normal.  Cardiovascular:  . RRR, no m/r/g . 2+ BLE/BUE extremity edema, but  significantly improving   Abdomen:  . Soft, mild to moderate generalized tenderness Psychiatric:  . Mental status o Mood, affect appropriate . judgment and insight appear intact   I have personally reviewed the following:   Data: . CBG stable, potassium without change 5.5, BUN without change 61, creatinine creeping up, 2.72. . Lipase improved, 179. . WBC stable at 13.  Hemoglobin stable at 8.3. . Urinalysis negative other than pyuria.  Renal ultrasound was negative for hydronephrosis.    Scheduled Meds: . ALPRAZolam  0.5 mg Oral QHS  . carvedilol  12.5 mg Oral BID WC  . cholecalciferol  5,000 Units Oral Daily  . cloNIDine  0.1 mg Oral TID  . clopidogrel  75 mg Oral Q breakfast  . DULoxetine  60 mg Oral Daily  . famotidine  20 mg Oral QAC supper  . feeding supplement (VITAL 1.5 CAL)  1,000 mL Per Tube Q24H  . furosemide  20 mg Intravenous BID  . gabapentin  300 mg Oral BID  . hydrALAZINE  100 mg Oral TID  . insulin aspart  0-9 Units Subcutaneous Q4H  . isosorbide mononitrate  15 mg Oral Daily  . lipase/protease/amylase  12,000 Units Oral TID AC  . lipase/protease/amylase)  20,880 Units Per Tube Once   And  . sodium bicarbonate  650 mg Per Tube Once  . mirtazapine  7.5 mg Oral QHS  . mometasone-formoterol  2 puff Inhalation BID  . nicotine  21 mg Transdermal Daily   Continuous Infusions: . meropenem (MERREM) IV 1 g (06/08/18 2144)    Principal Problem:   Pancreatitis, recurrent Active Problems:   TOBACCO  ABUSE   Essential hypertension   Atrial fibrillation (HCC)   COPD (chronic obstructive pulmonary disease) (HCC)   S/P CABG x 3   Anemia   GERD (gastroesophageal reflux disease)   Hyperlipidemia   CAD (coronary artery disease)   Chronic kidney disease (CKD), stage IV (severe) (HCC)   Chronic diastolic congestive heart failure (HCC)   Type II diabetes mellitus with renal manifestations (HCC)   SIRS (systemic inflammatory response syndrome) (HCC)    Protein-calorie malnutrition, severe   Anasarca   Demand ischemia (HCC)   Hyperkalemia   AKI (acute kidney injury) ([place])   LOS: 12 days

## 2018-06-09 NOTE — Progress Notes (Signed)
Patient ID: [name], female   DOB: [birth year], 67 y.o.   MRN: [id number]       Subjective: This patient is known to our service secondary to gallstone pancreatitis in mid-August.  She underwent an uneventful lap chole by Dr. Donne Hazel on 04/19/18. She did well and was discharged on POD2.  Once home though over the next several weeks, she continued to have RUQ abdominal pain.  She was seen in our office and sent to the ED for further work up to rule out complications, etc.  In the ED she had a CT that revealed fat stranding and changes still around the pancreatic head c/w recurrent or persistent pancreatitis.  She has been followed by her GI doctors as well.  She was not admitted at this time.  She returned to the ED about 2 weeks later with persistent abdominal pain.  She had another CT scan that revealed worsening severe inflammatory changes about the pancreatic head and neck, now involving the duodenum and hepatic flexure of the colon.  She is also noted to now have several fluid collections c/w pancreatis pseudocysts.  She was admitted and has been managed by medicine and GI for her pancreatitis.  Today she had a post pyloric PANDA placed and on the placement films they suggest either ileus or PSBO.  The patient denies any nausea or vomiting.  She last had a BM a couple of days ago and last passed flatus this morning.  She denies feeling bloated.  We have been asked to see her for further recommendations.  Objective: Vital signs in last 24 hours: Temp:  [97.5 F (36.4 C)-98.1 F (36.7 C)] 98 F (36.7 C) (10/04 0736) Pulse Rate:  [56-65] 65 (10/04 0736) Resp:  [17-20] 18 (10/04 0736) BP: (132-170)/(42-67) 170/52 (10/04 0736) SpO2:  [93 %-98 %] 94 % (10/04 0901) Weight:  [68.4 kg] 68.4 kg (10/03 2026) Last BM Date: 06/08/18  Intake/Output from previous day: 10/03 0701 - 10/04 0700 In: 660 [P.O.:460; IV Piggyback:200] Out: 1450 [Urine:1450] Intake/Output this shift: No intake/output data  recorded.  PE: Gen: NAD Heart: regular Lungs: CTAB Abd: soft, tender greatest on right side of her abdomen, no peritonitis, +BS, ND Ext: no cyanosis, clubbing, + 2 radial and pedal pulses bilaterally  Lab Results:  Recent Labs    06/07/18 0452 06/09/18 0708  WBC 13.7* 13.0*  HGB 8.2* 8.3*  HCT 25.7* 26.6*  PLT 339 357   BMET Recent Labs    06/08/18 0648 06/09/18 0708  NA 136 138  K 5.5* 5.5*  CL 94* 96*  CO2 29 29  GLUCOSE 130* 78  BUN 61* 61*  CREATININE 2.59* 2.72*  CALCIUM 9.0 9.2   PT/INR No results for input(s): LABPROT, INR in the last 72 hours. CMP     Component Value Date/Time   NA 138 06/09/2018 0708   K 5.5 (H) 06/09/2018 0708   CL 96 (L) 06/09/2018 0708   CO2 29 06/09/2018 0708   GLUCOSE 78 06/09/2018 0708   BUN 61 (H) 06/09/2018 0708   CREATININE 2.72 (H) 06/09/2018 0708   CREATININE 1.32 (H) 06/17/2015 1257   CALCIUM 9.2 06/09/2018 0708   PROT 5.3 (L) 06/08/2018 0648   ALBUMIN 2.1 (L) 06/08/2018 0648   AST 23 06/08/2018 0648   ALT 9 06/08/2018 0648   ALKPHOS 32 (L) 06/08/2018 0648   BILITOT 0.8 06/08/2018 0648   GFRNONAA 17 (L) 06/09/2018 0708   GFRAA 20 (L) 06/09/2018 0708   Lipase  Component Value Date/Time   LIPASE 179 (H) 06/09/2018 0708       Studies/Results: US Renal  Result Date: 06/08/2018 CLINICAL DATA:  Renal failure EXAM: RENAL / URINARY TRACT ULTRASOUND COMPLETE COMPARISON:  None. FINDINGS: Right Kidney: Length: 11.0 cm. Cortical thinning. Echogenicity within normal limits. No hydronephrosis. Left Kidney: Length: 11.3 cm. Cortical thinning. Echogenicity within normal limits. 14 x 11 x 14 mm lower pole cyst. No hydronephrosis. Bladder: Within normal limits. IMPRESSION: 14 mm left lower pole renal cyst.  No hydronephrosis. Electronically Signed   By: Julian Hy M.D.   On: 06/08/2018 01:11   Dg Abd Portable 1v  Result Date: 06/09/2018 CLINICAL DATA:  Feeding tube placement EXAM: PORTABLE ABDOMEN - 1 VIEW  COMPARISON:  Plain film of the abdomen from earlier same day. FINDINGS: Weighted tip feeding tube has now progressed into the proximal small bowel with tip well-positioned in the expected region of the ligament of Treitz. The mildly distended gas-filled loops of small bowel are again noted within the LEFT abdomen, unchanged in the short-term interval. No evidence of soft tissue mass or abnormal fluid collection. No evidence of free intraperitoneal air. Cholecystectomy clips in the RIGHT upper quadrant. Aorta bi-iliac stents in place. No acute or suspicious osseous finding. IMPRESSION: Weighted tip feeding tube has progressed into the proximal small bowel, with tip now well positioned at the expected region of the ligament of Treitz. Mildly prominent gas-filled loops of small bowel within the LEFT abdomen, similar to the plain film from earlier today, suggesting partial small bowel obstruction or ileus. Electronically Signed   By: Franki Cabot M.D.   On: 06/09/2018 13:23   Dg Abd Portable 1v  Result Date: 06/09/2018 CLINICAL DATA:  Nasogastric tube placement. EXAM: PORTABLE ABDOMEN - 1 VIEW COMPARISON:  Radiographs of May 30, 2018. FINDINGS: Mildly dilated small bowel loops are noted concerning for distal small bowel obstruction or ileus. Distal tip of feeding tube is seen in expected position of distal stomach. Status post cholecystectomy. Phleboliths are noted in the pelvis. Status post stent graft repair of abdominal aortic aneurysm. IMPRESSION: Distal tip of feeding tube seen in expected position of distal stomach. Mildly dilated small bowel loops are noted concerning for distal small bowel obstruction or ileus. Electronically Signed   By: Marijo Conception, M.D.   On: 06/09/2018 11:39    Anti-infectives: Anti-infectives (From admission, onward)   Start     Dose/Rate Route Frequency Ordered Stop   06/05/18 1445  meropenem (MERREM) 1 g in sodium chloride 0.9 % 100 mL IVPB     1 g 200 mL/hr over 30  Minutes Intravenous Every 12 hours 06/05/18 1433 06/10/18 2159   05/29/18 0600  piperacillin-tazobactam (ZOSYN) IVPB 3.375 g     3.375 g 12.5 mL/hr over 240 Minutes Intravenous Every 8 hours 05/28/18 2227 06/05/18 1600   05/28/18 2230  piperacillin-tazobactam (ZOSYN) IVPB 3.375 g     3.375 g 100 mL/hr over 30 Minutes Intravenous  Once 05/28/18 2227 05/29/18 0011       Assessment/Plan Ileus, likely secondary to severe necrotizing pancreatitis with pseudocyst, history of gallstone pancreatitis, s/p lap chole on 04-19-18 -her films do show a couple loops of mildly dilated bowel, however the patient is having BMs, passing flatus, with no N/V, and has active bowel sounds.  I do not suspect a bowel obstruction and only maybe a mild ileus at best.   -can proceed with TFs and see how she tolerates these.  If she develops worsening  distention, N/V, inability to pass flatus etc, then would likely need to hold TFs. -no acute surgical indications.  -defer to medicine and GI for further management of her pancreatitis.    LOS: 12 days    Henreitta Cea , [facility] Surgery 06/09/2018, 3:42 PM Pager: [phone]

## 2018-06-09 NOTE — Progress Notes (Signed)
Physical Therapy Treatment Patient Details Name: [name] MRN: [id number] DOB: Aug 26, 1951 Today's Date: 06/09/2018    History of Present Illness Pt is a 67 y/o female with c/o of abdominal pain. Note recent hospitalization 04/09/18 to 04/21/18 with GI bleed, gallstone pancreatitis, and during which she received laparoscopic cholecystectomy. Diagnosed with recurrent pancreatitis. PMH AAA, CHF, chronic back pain, CKD, COPD, GI bleed, HTN, MI, A-fib, DM, cardiac cath, CABG, maze procedure, mitral valve repair, R femoral-popliteal bypass     PT Comments    Pt very limited this session secondary to lethargy and fatigue. Only able to tolerate bilateral LE therex. Continuing to recommend post-acute rehab at a SNF prior to returning home alone. Pt would continue to benefit from skilled physical therapy services at this time while admitted and after d/c to address the below listed limitations in order to improve overall safety and independence with functional mobility.    Follow Up Recommendations  SNF     Equipment Recommendations  None recommended by PT    Recommendations for Other Services       Precautions / Restrictions Precautions Precautions: Fall;Other (comment) Precaution Comments: watch SPO2/HR; Cortrak Restrictions Weight Bearing Restrictions: No    Mobility  Bed Mobility               General bed mobility comments: focus of session was on bilateral LE therex secondary to lethargy  Transfers                    Ambulation/Gait                 Stairs             Wheelchair Mobility    Modified Rankin (Stroke Patients Only)       Balance                                            Cognition Arousal/Alertness: Lethargic;Suspect due to medications Behavior During Therapy: Flat affect Overall Cognitive Status: Within Functional Limits for tasks assessed                                 General Comments:  pt given Ativan earlier today      Exercises General Exercises - Upper Extremity Digit Composite Flexion: AROM;Both;10 reps;Supine(for edema control) Composite Extension: AROM;Both;10 reps;Supine(for edema control) General Exercises - Lower Extremity Ankle Circles/Pumps: AROM;Both;20 reps;Supine Short Arc Quad: AROM;AAROM;Both;10 reps;Supine Heel Slides: AROM;AAROM;Both;10 reps;Supine Hip ABduction/ADduction: AROM;AAROM;Both;10 reps;Supine Other Exercises Other Exercises: bilateral UE overhead reaching x10 in supine for edema control    General Comments        Pertinent Vitals/Pain Pain Assessment: No/denies pain    Home Living                      Prior Function            PT Goals (current goals can now be found in the care plan section) Acute Rehab PT Goals PT Goal Formulation: With patient Time For Goal Achievement: 06/20/18 Potential to Achieve Goals: Good Progress towards PT goals: Progressing toward goals    Frequency    Min 3X/week      PT Plan Current plan remains appropriate    Co-evaluation  AM-PAC PT "6 Clicks" Daily Activity  Outcome Measure  Difficulty turning over in bed (including adjusting bedclothes, sheets and blankets)?: Unable Difficulty moving from lying on back to sitting on the side of the bed? : Unable Difficulty sitting down on and standing up from a chair with arms (e.g., wheelchair, bedside commode, etc,.)?: Unable Help needed moving to and from a bed to chair (including a wheelchair)?: A Little Help needed walking in hospital room?: A Little Help needed climbing 3-5 steps with a railing? : A Lot 6 Click Score: 11    End of Session   Activity Tolerance: Patient limited by lethargy;Patient limited by fatigue Patient left: in bed;with call bell/phone within reach;with bed alarm set;with family/visitor present Nurse Communication: Mobility status PT Visit Diagnosis: Muscle weakness (generalized)  (M62.81)     Time: 9147-8295 PT Time Calculation (min) (ACUTE ONLY): 17 min  Charges:  $Therapeutic Exercise: 8-22 mins                     Sherie Don, PT, DPT  Acute Rehabilitation Services Pager [phone] Office [place] 06/09/2018, 3:20 PM

## 2018-06-09 NOTE — Progress Notes (Signed)
Nutrition Follow-up  DOCUMENTATION CODES:   Severe malnutrition in context of chronic illness  INTERVENTION:   Tube Feeding:  Vital AF 1.2 @ 60 ml/hr for 24 hours/day Provides 1728 kcals, 108 g of protein and 1166 mL of free water Meets 100% estimated calorie and protein needs  NUTRITION DIAGNOSIS:   Severe Malnutrition related to chronic illness(CHF, AAA s/p graft repair, pancreatitis) as evidenced by severe muscle depletion, moderate muscle depletion, mild fat depletion, moderate fat depletion, energy intake < or equal to 75% for > or equal to 1 month.  Being addressed via TF   GOAL:   Patient will meet greater than or equal to 90% of their needs  Progressing  MONITOR:   Diet advancement, Labs, TF tolerance, I & O's, Weight trends  REASON FOR ASSESSMENT:   Consult Enteral/tube feeding initiation and management  ASSESSMENT:   Patient with PMH significant for HTN, HLD, DM, COPD, GERD, gout, depression with anxiety, tobacco abuse, AAA s/p stent graft repair 01/2018, CAD, CABG, CHF, CKD IV s/p AV fistula for anticipated transition to HD, GI bleeding, mitral valve regurgitation, PAD, and s/p lap chole 04/19/18. Presents this admission with abdominal pain. Admitted for recurrent vs never resolved acute pancreatitis and GIB.   Cortrak tube clogged since 10/2; Cortrak team able to unclog this AM. Abdominal xray with Cortrak tube tip in stomach; Cortrak team able to advanced to LOT (confirmed via abdominal xray)  Pt now NPO again Abdominal xray also concerning for possible ileus Pt complaining of significant abdominal pain  Potassium 5.5, monitor electrolytes  Noted 1 BM documented yesterday, +BM today  Labs: potassium 5.5, Creatinine 2.72, BUN 61, lipase 179; CBGs wdl Meds: lasix, cholecalciferol, creon, remeron  Diet Order:   Diet Order            Diet NPO time specified Except for: Sips with Meds  Diet effective now              EDUCATION NEEDS:   Not  appropriate for education at this time  Skin:  Skin Assessment: Reviewed RN Assessment  Last BM:  10/3  Height:   Ht Readings from Last 1 Encounters:  05/30/18 5\' 5"  (1.651 m)    Weight:   Wt Readings from Last 1 Encounters:  06/08/18 68.4 kg    Ideal Body Weight:  56.8 kg  BMI:  Body mass index is 25.09 kg/m.  Estimated Nutritional Needs:   Kcal:  1700-1900 kcal  Protein:  85-100 g  Fluid:  >/= 1.7 L/day   Kerman Passey MS, RD, LDN, CNSC [phone] Pager  [phone] Weekend/On-Call Pager

## 2018-06-09 NOTE — Care Management Note (Addendum)
Case Management Note  Patient Details  Name: [name] MRN: [id number] Date of Birth: 1950/10/19  Subjective/Objective:     Pt not improving.  Rejected by LTACs.  Pt from home with daughter who is a Pharmacist, hospital and works 7a-3pm M-F.  Pt's son in law is an EMT and can help with some medical needs (TF if necessary), but also works during day. Pt's first choice is to go home.   PC through [facility],  Patient has walker, 3 in 1 and oxygen at home .        Action/Plan: Advised patient and sister that LTAC is not an option at this point and that [facility] has been set up, but we are unable right now to assess what patient's needs will be.  Pt's medical needs at this time exceed her family's abilities for 24 hr care.  Pt is open to considering SNF, but states it is last choice.  Palliative to consult. Will continue to follow.  Expected Discharge Date:                  Expected Discharge Plan:  Skilled Nursing Facility  In-House Referral:  Clinical Social Work  Discharge planning Services  CM Consult  Post Acute Care Choice:    Choice offered to:  Patient, Adult Children  DME Arranged:  N/A DME Agency:  NA  HH Arranged:  RN, PT, OT, Nurse's Aide, Social Work [employer] Agency:  [place]  Status of Service:  In process, will continue to follow  If discussed at Long Length of Stay Meetings, dates discussed:    Additional Comments:  Claudie Leach, RN 06/09/2018, 10:41 AM

## 2018-06-09 NOTE — Consult Note (Signed)
[facility] Renal Consultation Note  Requesting MD: Dr. Sarajane Jews Indication for Consultation:  AKI on CKD  HPI: 67yo F with hypertension, hyperlipidemia, DM, COPD on home oxygen, GERD, gout, depression, anxiety, tobacco abuse, atrial fibrillation not on anticoagulants, AAA, CAD, CABG, CHF, CKD4-5 (Dr. Posey Pronto [facility]),h/o GI bleeding, mitral valve regurgitation, PAD  admitted on 05/28/2018 with abdominal pain from pancreatitis with lipase 667.    During this admission she has been treated for the pancreatitis with IVF (net +14L per I/Os), pain control.  Initial imaging with concern for pseudocyst, necrosis. Given protracted course GI was reconsulted today.  She has been treated with empiric antibiotics for 14d course though cultures negative.  She has developed anasarca.  Lasix 20 IV BID was started 10/2 for anasarca and UOP yesterday was 1.5L. Cr trend 9/29 1.85 > 9/30 2.07 > 10/1 2.24 > 10/2 2.47 > 10/3 2.6 > 10/4 2.72.   She was hospitalized in 04/2018 for gallstone pancreatitis and underwent cholecystectomy.  During that admission her cr ranged 1.9-2.8.   During a 01/2018 admission for endovascular AAA repair her creatinine ranged 2.2 -4.3.  She did not require dialysis.   She currently is sleepy after benzo to help place NG tube.  She has 2 daughters and 1 sister at bedside.   Creat  Date/Time Value Ref Range Status  06/17/2015 12:57 PM 1.32 (H) 0.50 - 0.99 mg/dL Final   Creatinine, Ser  Date/Time Value Ref Range Status  06/09/2018 07:08 AM 2.72 (H) 0.44 - 1.00 mg/dL Final  06/08/2018 06:48 AM 2.59 (H) 0.44 - 1.00 mg/dL Final  06/07/2018 04:52 AM 2.47 (H) 0.44 - 1.00 mg/dL Final  06/06/2018 05:51 AM 2.23 (H) 0.44 - 1.00 mg/dL Final  06/05/2018 05:38 AM 2.07 (H) 0.44 - 1.00 mg/dL Final  06/04/2018 05:37 AM 1.85 (H) 0.44 - 1.00 mg/dL Final  06/03/2018 05:26 AM 1.74 (H) 0.44 - 1.00 mg/dL Final  06/02/2018 06:08 AM 1.66 (H) 0.44 - 1.00 mg/dL Final  06/01/2018 04:50 AM 1.76 (H)  0.44 - 1.00 mg/dL Final  05/31/2018 05:22 AM 1.94 (H) 0.44 - 1.00 mg/dL Final  05/30/2018 02:36 AM 2.27 (H) 0.44 - 1.00 mg/dL Final  05/29/2018 03:28 PM 1.98 (H) 0.44 - 1.00 mg/dL Final  05/29/2018 03:35 AM 1.91 (H) 0.44 - 1.00 mg/dL Final  05/28/2018 03:07 PM 1.80 (H) 0.44 - 1.00 mg/dL Final  05/15/2018 07:55 PM 1.92 (H) 0.44 - 1.00 mg/dL Final  04/20/2018 04:50 AM 2.36 (H) 0.44 - 1.00 mg/dL Final  04/19/2018 05:07 AM 2.13 (H) 0.44 - 1.00 mg/dL Final  04/18/2018 05:01 AM 2.12 (H) 0.44 - 1.00 mg/dL Final  04/17/2018 05:34 AM 2.18 (H) 0.44 - 1.00 mg/dL Final  04/16/2018 06:10 AM 2.54 (H) 0.44 - 1.00 mg/dL Final  04/15/2018 05:29 AM 2.01 (H) 0.44 - 1.00 mg/dL Final  04/14/2018 04:19 AM 2.64 (H) 0.44 - 1.00 mg/dL Final  04/13/2018 12:07 AM 2.65 (H) 0.44 - 1.00 mg/dL Final  04/12/2018 05:57 AM 2.73 (H) 0.44 - 1.00 mg/dL Final  04/11/2018 04:51 AM 2.78 (H) 0.44 - 1.00 mg/dL Final  04/10/2018 05:00 AM 2.95 (H) 0.44 - 1.00 mg/dL Final  04/09/2018 07:58 PM 3.03 (H) 0.44 - 1.00 mg/dL Final  03/21/2018 06:30 AM 3.10 (H) 0.44 - 1.00 mg/dL Final  01/25/2018 03:27 AM 2.90 (H) 0.44 - 1.00 mg/dL Final  01/25/2018 03:27 AM 2.89 (H) 0.44 - 1.00 mg/dL Final  01/24/2018 04:06 AM 3.25 (H) 0.44 - 1.00 mg/dL Final  01/24/2018 04:06 AM 3.27 (H) 0.44 -  1.00 mg/dL Final  01/23/2018 04:19 AM 3.69 (H) 0.44 - 1.00 mg/dL Final  01/22/2018 04:10 AM 4.30 (H) 0.44 - 1.00 mg/dL Final  01/21/2018 04:06 AM 4.31 (H) 0.44 - 1.00 mg/dL Final  01/20/2018 08:03 AM 4.00 (H) 0.44 - 1.00 mg/dL Final  01/20/2018 04:46 AM 3.99 (H) 0.44 - 1.00 mg/dL Final  01/19/2018 04:32 AM 3.86 (H) 0.44 - 1.00 mg/dL Final    Comment:    DELTA CHECK NOTED  01/18/2018 04:45 AM 2.65 (H) 0.44 - 1.00 mg/dL Final  01/17/2018 10:00 PM 2.43 (H) 0.44 - 1.00 mg/dL Final  01/17/2018 03:31 AM 2.29 (H) 0.44 - 1.00 mg/dL Final  01/14/2018 12:00 PM 2.17 (H) 0.44 - 1.00 mg/dL Final  10/08/2015 05:35 AM 2.17 (H) 0.44 - 1.00 mg/dL Final  10/07/2015 01:55  AM 2.24 (H) 0.44 - 1.00 mg/dL Final  10/06/2015 05:49 PM 2.17 (H) 0.44 - 1.00 mg/dL Final  07/04/2015 01:21 PM 1.89 (H) 0.44 - 1.00 mg/dL Final  07/03/2015 11:51 PM 2.04 (H) 0.44 - 1.00 mg/dL Final  06/26/2015 04:45 AM 1.43 (H) 0.44 - 1.00 mg/dL Final  06/25/2015 04:15 AM 1.71 (H) 0.44 - 1.00 mg/dL Final  06/24/2015 06:42 AM 1.71 (H) 0.44 - 1.00 mg/dL Final  09/11/2013 06:47 AM 1.70 (H) 0.50 - 1.10 mg/dL Final  01/25/2013 12:11 PM 1.6 (H) 0.4 - 1.2 mg/dL Final     PMHx:   Past Medical History:  Diagnosis Date  . AAA (abdominal aortic aneurysm) ([place]) 5/09   3.7x3.3 by u/s 2009  . Abnormal EKG    deep TW inversions chronic  . Anemia   . CAD (coronary artery disease)    a. CABG 03/2011 LIMA to LAD, SVG to OM, SVG to PDA. b. cath 06/25/2015 DES to SVG to rPDA, patent LIMA to LAD, patent SVG to OM  . carotid stenosis 5/09   S/p L CEA ;  60-79% bilat ICA by preCABG dopplers 7/12  . CHF (congestive heart failure) ([place])   . Chronic back pain    "all over" (06/24/2015)  . Chronic bronchitis ([place])    "get it pretty much q yr" (06/24/2015)  . CKD (chronic kidney disease)   . Complication of anesthesia 1966   "problem w/ether"  . COPD (chronic obstructive pulmonary disease) ([place])   . Coronary artery disease 5/09   s/p CABG 7/12   L-LAD, S-OM, S-PDA  . Depression   . GERD (gastroesophageal reflux disease)    With hiatal hernia  . GIB (gastrointestinal bleeding) 9/11   S/p EGD with cautery at [facility]  . Heart murmur   . History of blood transfusion "a few times"   "all related to anemia" (06/24/2015)  . History of hiatal hernia   . Hyperlipidemia   . Hypertension   . Mitral regurgitation    3+ MR by intraoperative TEE;  s/p MV repair with Dr. Roxy Manns 7/12  . Myocardial infarction [facility]) 2012 "several"  . On home oxygen therapy    "2 liters at night; negative for sleep apnea"  . PAD (peripheral artery disease) ([facility])    Severe; s/p bilateral renal artery stents, moderate in-stent  restenosis  . Pancreatitis 05/2018  . Paroxysmal atrial fibrillation ([facility])    coumadin;  echo 9/07: EF 60%, mild LVH;  s/p Cox Maze 7/12 with LAA clipping  . Subclavian artery stenosis, left ([facility])    stented by Dr. Trula Slade on 10/18 to help flow of her LIMA to LAD  . Type II diabetes mellitus ([place])  Past Surgical History:  Procedure Laterality Date  . A/V FISTULAGRAM Left 03/21/2018   Procedure: A/V FISTULAGRAM;  Surgeon: Serafina Mitchell, MD;  Location: [place] [facility];  Service: Cardiovascular;  Laterality: Left;  . ABDOMINAL AORTIC ENDOVASCULAR STENT GRAFT N/A 01/17/2018   Procedure: ABDOMINAL AORTIC ENDOVASCULAR STENT GRAFT WITH CO2;  Surgeon: Serafina Mitchell, MD;  Location: [place];  Service: Vascular;  Laterality: N/A;  . ABDOMINAL HYSTERECTOMY  1990's  . APPENDECTOMY  Aug. 11, 2016   Ruptured  . AV FISTULA PLACEMENT Left 11/03/2017   Procedure: ARTERIOVENOUS (AV) FISTULA CREATION LEFT ARM;  Surgeon: Serafina Mitchell, MD;  Location: [place];  Service: Vascular;  Laterality: Left;  . BOWEL RESECTION  2013  . CARDIAC CATHETERIZATION N/A 06/24/2015   Procedure: Left Heart Cath and Cors/Grafts Angiography;  Surgeon: Leonie Man, MD;  Location: [place] [facility];  Service: Cardiovascular;  Laterality: N/A;  . CARDIAC CATHETERIZATION N/A 06/25/2015   Procedure: Coronary Stent Intervention;  Surgeon: Sherren Mocha, MD;  Location: [place] [facility];  Service: Cardiovascular;  Laterality: N/A;  . CAROTID ENDARTERECTOMY Left   . CATARACT EXTRACTION Right 03/27/2018  . CHOLECYSTECTOMY N/A 04/19/2018   Procedure: LAPAROSCOPIC CHOLECYSTECTOMY WITH INTRAOPERATIVE CHOLANGIOGRAM;  Surgeon: Rolm Bookbinder, MD;  Location: [place];  Service: General;  Laterality: N/A;  . CORONARY ANGIOPLASTY    . CORONARY ARTERY BYPASS GRAFT  03/05/2011   CABG X3 (LIMA to LAD, SVG to OM, SVG to PDA, EVH via left thigh  . CORONARY STENT PLACEMENT    . ESOPHAGOGASTRODUODENOSCOPY N/A 04/12/2018   Procedure:  ESOPHAGOGASTRODUODENOSCOPY (EGD);  Surgeon: [name], MD;  Location: [facility];  Service: Endoscopy;  Laterality: N/A;  . LACERATION REPAIR Right 1990's   WRIST  . MAZE  03/05/2011   complete biatrial lesion set with clipping of LA appendage  . MITRAL VALVE REPAIR  03/05/2011   33m Memo 3D ring annuloplasty for ischemic MR  . PERIPHERAL VASCULAR BALLOON ANGIOPLASTY Left 03/21/2018   Procedure: PERIPHERAL VASCULAR BALLOON ANGIOPLASTY;  Surgeon: BSerafina Mitchell MD;  [facility];  Service: Cardiovascular;  Laterality: Left;  . PERIPHERAL VASCULAR CATHETERIZATION N/A 06/24/2015   Procedure: Aortic Arch Angiography;  Surgeon: VSerafina Mitchell MD;  [facility];  Service: Cardiovascular;  Laterality: N/A;  . PERIPHERAL VASCULAR CATHETERIZATION  06/24/2015   Procedure: Peripheral Vascular Intervention;  Surgeon: VSerafina Mitchell MD;  [facility];  Service: Cardiovascular;;  . PERIPHERAL VASCULAR CATHETERIZATION N/A 06/24/2015   Procedure: Abdominal Aortogram;  Surgeon: VSerafina Mitchell MD;  [place];  Service: Cardiovascular;  Laterality: N/A;  . RENAL ANGIOGRAM Bilateral 09/11/2013   Procedure: RENAL ANGIOGRAM;  Surgeon: VSerafina Mitchell MD;  [facility];  Service: Cardiovascular;  Laterality: Bilateral;  . RIGHT FEMORAL-POPLITEAL BYPASS      Family Hx:  Family History  Problem Relation Age of Onset  . Emphysema Mother   . Heart disease Mother        before age 65 . Hypertension Mother   . Hyperlipidemia Mother   . Heart attack Mother   . AAA (abdominal aortic aneurysm) Mother        rupture  . Heart attack Father 769 . Emphysema Father   . Heart disease Father        before age 65 . Hyperlipidemia Father   . Hypertension Father   . Peripheral vascular disease Father   .  Diabetes Brother   . Heart disease Brother        before age 11  . Hyperlipidemia Brother   . Hypertension Brother   .  Heart attack Brother        CABG  . Heart disease Other        Vascular disease in grandparents, uncles and dad  . Colon cancer Neg Hx     Social History:  reports that she has been smoking cigarettes. She has smoked for the past 50.00 years. She has never used smokeless tobacco. She reports that she drinks alcohol. She reports that she has current or past drug history. Drug: Marijuana.  Allergies:  Allergies  Allergen Reactions  . Isosorbide Other (See Comments)    Can only tolerate in low doses  . Isosorbide Nitrate Other (See Comments)    Can only tolerate in low doses  . Oxycodone-Acetaminophen Itching  . Codeine Itching  . Contrast Media [Iodinated Diagnostic Agents] Itching and Other (See Comments)    Itching of feet  . Ioxaglate Itching and Other (See Comments)    Itching of feet  . Metrizamide Itching and Other (See Comments)    Itching of feet  . Percocet [Oxycodone-Acetaminophen] Itching and Other (See Comments)    Tolerates Hydrocodone    Medications: Prior to Admission medications   Medication Sig Start Date End Date Taking? Authorizing Provider  acetaminophen (TYLENOL) 325 MG tablet Take 325 mg by mouth every 6 (six) hours as needed (for pain).   Yes [provider]  albuterol (PROVENTIL HFA;VENTOLIN HFA) 108 (90 Base) MCG/ACT inhaler Inhale 2 puffs into the lungs every 4 (four) hours as needed for wheezing or shortness of breath. ProAir Patient taking differently: Inhale 2 puffs into the lungs every 4 (four) hours as needed for wheezing or shortness of breath.  10/08/15  Yes Delfina Redwood, MD  allopurinol (ZYLOPRIM) 100 MG tablet Take 100 mg by mouth daily. 02/02/17  Yes [provider]  ALPRAZolam Duanne Moron) 0.5 MG tablet Take 0.5 mg by mouth at bedtime.    Yes [provider]  amLODipine (NORVASC) 10 MG tablet Take 10 mg by mouth daily.     Yes [provider]  atorvastatin (LIPITOR) 40 MG tablet Take 40 mg by mouth every  evening.  12/07/17  Yes [provider]  budesonide-formoterol (SYMBICORT) 160-4.5 MCG/ACT inhaler Inhale 2 puffs into the lungs 2 (two) times daily as needed (for flares).   Yes [provider]  carvedilol (COREG) 25 MG tablet TAKE 1 TABLET BY MOUTH 2 TIMES A DAY WITH A MEAL Patient taking differently: Take 25 mg by mouth 2 (two) times daily with a meal.  11/04/15  Yes Burtis Junes, NP  Cholecalciferol (VITAMIN D3) 5000 units TABS Take 5,000 Units by mouth daily.   Yes [provider]  cloNIDine (CATAPRES) 0.2 MG tablet Take 1 tablet (0.2 mg total) by mouth 3 (three) times daily. 07/10/15  Yes Richardson Dopp T, PA-C  clopidogrel (PLAVIX) 75 MG tablet Take 1 tablet (75 mg total) by mouth daily with breakfast. 06/26/15  Yes Almyra Deforest, PA  docusate sodium (COLACE) 100 MG capsule Take 200 mg by mouth daily as needed (when taking pain medications).    Yes [provider]  DULoxetine (CYMBALTA) 60 MG capsule Take 60 mg by mouth daily.  10/26/17  Yes [provider]  famotidine (PEPCID) 20 MG tablet TAKE 1 TABLET (20 MG TOTAL) BY MOUTH DAILY BEFORE SUPPER. Patient taking differently: Take  20 mg by mouth daily before supper.  01/27/16  Yes Sherren Mocha, MD  fenofibrate 160 MG tablet Take 1 tablet (160 mg total) by mouth daily. Patient taking differently: Take 160 mg by mouth at bedtime.  10/08/15  Yes Delfina Redwood, MD  ferrous sulfate 325 (65 FE) MG tablet Take 325 mg by mouth 2 (two) times daily with a meal.   Yes [provider]  furosemide (LASIX) 40 MG tablet Take 40 mg by mouth See admin instructions. Take 40 mg by mouth once a day and may take an additional 40 mg day as needed for weight gain 05/30/17  Yes [provider]  gabapentin (NEURONTIN) 300 MG capsule TAKE 1 CAPSULE(300 MG) BY MOUTH TWICE DAILY Patient taking differently: Take 300 mg by mouth 2 (two) times daily.  05/05/18  Yes Serafina Mitchell, MD  hydrALAZINE (APRESOLINE)  100 MG tablet Take 100 mg by mouth 3 (three) times daily.   Yes [provider]  HYDROcodone-acetaminophen (NORCO/VICODIN) 5-325 MG tablet Take 1 tablet by mouth every 6 (six) hours as needed for moderate pain. 04/20/18  Yes Rolm Bookbinder, MD  isosorbide mononitrate (IMDUR) 30 MG 24 hr tablet Take 0.5 tablets (15 mg total) by mouth daily. 12/02/15  Yes Sherren Mocha, MD  mirtazapine (REMERON) 7.5 MG tablet Take 7.5 mg by mouth at bedtime.  02/15/18 08/14/18 Yes [provider]  pantoprazole (PROTONIX) 40 MG tablet Take 1 tablet (40 mg total) by mouth daily. 06/26/15  Yes Almyra Deforest, PA  Potassium 99 MG TABS Take 99 mg by mouth daily.    Yes [provider]  promethazine (PHENERGAN) 25 MG tablet Take 1 tablet (25 mg total) by mouth every 6 (six) hours as needed for nausea or vomiting. 05/16/18  Yes Ward, Delice Bison, DO    I have reviewed the patient's current medications.  Labs:  Results for orders placed or performed during the hospital encounter of 05/28/18 (from the past 48 hour(s))  Glucose, capillary     Status: Abnormal   Collection Time: 06/07/18  4:03 PM  Result Value Ref Range   Glucose-Capillary 111 (H) 70 - 99 mg/dL  Glucose, capillary     Status: Abnormal   Collection Time: 06/07/18  8:29 PM  Result Value Ref Range   Glucose-Capillary 168 (H) 70 - 99 mg/dL  Glucose, capillary     Status: Abnormal   Collection Time: 06/08/18 12:34 AM  Result Value Ref Range   Glucose-Capillary 112 (H) 70 - 99 mg/dL  Glucose, capillary     Status: Abnormal   Collection Time: 06/08/18  4:35 AM  Result Value Ref Range   Glucose-Capillary 111 (H) 70 - 99 mg/dL  Lipase, blood     Status: Abnormal   Collection Time: 06/08/18  6:48 AM  Result Value Ref Range   Lipase 677 (H) 11 - 51 U/L    Comment: RESULTS CONFIRMED BY MANUAL DILUTION Performed at [facility], [address] [place], [place] [address]   Comprehensive metabolic panel     Status: Abnormal    Collection Time: 06/08/18  6:48 AM  Result Value Ref Range   Sodium 136 135 - 145 mmol/L   Potassium 5.5 (H) 3.5 - 5.1 mmol/L   Chloride 94 (L) 98 - 111 mmol/L   CO2 29 22 - 32 mmol/L   Glucose, Bld 130 (H) 70 - 99 mg/dL   BUN 61 (H) 8 - 23 mg/dL   Creatinine, Ser 2.59 (H) 0.44 -  1.00 mg/dL   Calcium 9.0 8.9 - 10.3 mg/dL   Total Protein 5.3 (L) 6.5 - 8.1 g/dL   Albumin 2.1 (L) 3.5 - 5.0 g/dL   AST 23 15 - 41 U/L   ALT 9 0 - 44 U/L   Alkaline Phosphatase 32 (L) 38 - 126 U/L   Total Bilirubin 0.8 0.3 - 1.2 mg/dL   GFR calc non Af Amer 18 (L) >60 mL/min   GFR calc Af Amer 21 (L) >60 mL/min    Comment: (NOTE) The eGFR has been calculated using the CKD EPI equation. This calculation has not been validated in all clinical situations. eGFR's persistently <60 mL/min signify possible Chronic Kidney Disease.    Anion gap 13 5 - 15    Comment: Performed at [facility] [address], [place], [place] [address]  Glucose, capillary     Status: Abnormal   Collection Time: 06/08/18  7:44 AM  Result Value Ref Range   Glucose-Capillary 142 (H) 70 - 99 mg/dL  Glucose, capillary     Status: Abnormal   Collection Time: 06/08/18 11:55 AM  Result Value Ref Range   Glucose-Capillary 130 (H) 70 - 99 mg/dL  Glucose, capillary     Status: None   Collection Time: 06/08/18  4:42 PM  Result Value Ref Range   Glucose-Capillary 92 70 - 99 mg/dL  Glucose, capillary     Status: None   Collection Time: 06/08/18  8:11 PM  Result Value Ref Range   Glucose-Capillary 82 70 - 99 mg/dL  Glucose, capillary     Status: None   Collection Time: 06/09/18 12:53 AM  Result Value Ref Range   Glucose-Capillary 85 70 - 99 mg/dL  Urinalysis, Routine w reflex microscopic     Status: Abnormal   Collection Time: 06/09/18  3:00 AM  Result Value Ref Range   Color, Urine YELLOW YELLOW   APPearance CLEAR CLEAR   Specific Gravity, Urine 1.006 1.005 - 1.030   pH 8.0 5.0 - 8.0   Glucose, UA NEGATIVE NEGATIVE mg/dL    Hgb urine dipstick NEGATIVE NEGATIVE   Bilirubin Urine NEGATIVE NEGATIVE   Ketones, ur NEGATIVE NEGATIVE mg/dL   Protein, ur NEGATIVE NEGATIVE mg/dL   Nitrite NEGATIVE NEGATIVE   Leukocytes, UA TRACE (A) NEGATIVE   RBC / HPF 0-5 0 - 5 RBC/hpf   WBC, UA 11-20 0 - 5 WBC/hpf   Bacteria, UA NONE SEEN NONE SEEN   Squamous Epithelial / LPF 0-5 0 - 5   Mucus PRESENT     Comment: Performed at [facility], [place] [address], [place], [place] [address]  Glucose, capillary     Status: None   Collection Time: 06/09/18  4:06 AM  Result Value Ref Range   Glucose-Capillary 85 70 - 99 mg/dL  Lipase, blood     Status: Abnormal   Collection Time: 06/09/18  7:08 AM  Result Value Ref Range   Lipase 179 (H) 11 - 51 U/L    Comment: Performed at [facility], [place] [address] [place], [place] [address]  Basic metabolic panel     Status: Abnormal   Collection Time: 06/09/18  7:08 AM  Result Value Ref Range   Sodium 138 135 - 145 mmol/L   Potassium 5.5 (H) 3.5 - 5.1 mmol/L   Chloride 96 (L) 98 - 111 mmol/L   CO2 29 22 - 32 mmol/L   Glucose, Bld 78 70 - 99 mg/dL   BUN  61 (H) 8 - 23 mg/dL   Creatinine, Ser 2.72 (H) 0.44 - 1.00 mg/dL   Calcium 9.2 8.9 - 10.3 mg/dL   GFR calc non Af Amer 17 (L) >60 mL/min   GFR calc Af Amer 20 (L) >60 mL/min    Comment: (NOTE) The eGFR has been calculated using the CKD EPI equation. This calculation has not been validated in all clinical situations. eGFR's persistently <60 mL/min signify possible Chronic Kidney Disease.    Anion gap 13 5 - 15    Comment: Performed at [facility] [address], [place], [place] [address]  CBC     Status: Abnormal   Collection Time: 06/09/18  7:08 AM  Result Value Ref Range   WBC 13.0 (H) 4.0 - 10.5 K/uL   RBC 2.64 (L) 3.87 - 5.11 MIL/uL   Hemoglobin 8.3 (L) 12.0 - 15.0 g/dL   HCT 26.6 (L) 36.0 - 46.0 %   MCV 100.8 (H) 78.0 - 100.0 fL   MCH 31.4 26.0 - 34.0 pg   MCHC 31.2 30.0 - 36.0 g/dL   RDW 17.4 (H) 11.5  - 15.5 %   Platelets 357 150 - 400 K/uL    Comment: Performed at [facility], [place] [address], [place], [place] [address]  Glucose, capillary     Status: None   Collection Time: 06/09/18  7:35 AM  Result Value Ref Range   Glucose-Capillary 78 70 - 99 mg/dL  Glucose, capillary     Status: None   Collection Time: 06/09/18 11:42 AM  Result Value Ref Range   Glucose-Capillary 89 70 - 99 mg/dL     ROS:  Pertinent items are noted in HPI.  Physical Exam: Vitals:   06/09/18 0736 06/09/18 0901  BP: (!) 170/52   Pulse: 65   Resp: 18   Temp: 98 F (36.7 C)   SpO2: 93% 94%     General: chronically ill appearing HEENT: cracked lips Eyes: anicteric Neck: no JVD Heart: RRR, no rub Lungs: clear anteriorly, normal WOB Abdomen: soft, nontender with light palpation Extremities: 2+ pitting edema diffusely, LUE AVF thrill and bruit but feels small Skin: a few ecchymoes Neuro: sleepy after benzo; having some myoclonic jerks.  Assessment/Plan: 1.  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  In the setting of diuresis she has had rising creatinine.  Currently it appears she is intravascularly depleted with anasarca.  Hold lasix today, monitor daily.  Fine line to walk here and should be diuresed as able.  Work on Occupational psychologist.  She has a L BC AVF which feels small and I doubt it's useable.   Hopefully we won't reach that point.  Of note husband died shortly after starting dialysis and this is very anxiety provoking for family and patient.  2.  HTN: Generally in the 130-150s recently which is acceptably controlled currently. Coreg 12.5 BID, clonidine 0.1 TID, hydralazine 100 TID, imdur 15.   3.  Hyperkalemia: modest, 5.5 in the past 2 days. If rises further will use K binder.  On tube feeds ? If K controlled formula.   4.  Anemia:  Hb 8.3, sat 16% on 10/2.  Ferritin ~1500, folate 12.  Stable over the hospitalization after  transfusion.  Give IV iron and arensp 40.    Jannifer Hick A 06/09/2018, 3:39 PM

## 2018-06-09 NOTE — Progress Notes (Addendum)
[facility] Progress Note  CC:  My pancreatitis  Assessment / Plan: Severe recurrent versus unresolved acute pancreatitis with necrosis and developing pseudocyst    - ANA negative, IgG4 normal    - lipase peaked yesterday at 677, 179 today; LFTs are normal Progressive leukocytosis, now improving Dobhoff tip in the stomach on recent imaging studies Advanced CKD stage 5 - limiting candidacy for contrasted cross-sectional imaging studies Cholecystectomy 04/19/18 for suspected gallstone pancreatitis Severe protein calorie malnutrition Normocytic anemia, stable    - normal iron, B12, and folate levels; ferritin is elevated at 1466    - hemoccult + x 3 in August 2019    - EGD with Dr. [name] 04/12/18: normal, no biopsies obtained    - colonoscopy Dr. Dorrene German 2019: incomplete report avoidable, notes diverticulosis AAA repair 01/2018 Recent diarrhea, improved on Creon, thought to be antibiotic associated diarrhea +/- enteral feedss  Discussed with Dr. Sarajane Jews. Agree with attempt to reposition Dobhoff to allow enteral feeds below the Ligament of Trietz as this may explain her difficulty with diet advance. There may be a component of ileus.  Continue IV hydration and adequate pain control. Clear liquids as tolerated. Unfortunately, no options for advanced pancreatic imaging in the setting of advanced kidney disease.   I will see the patient again when I return to the hospital 06/12/18. Please call the on-call gastroenterologist with any questions or concerns before then.   Subjective: Notes good day and bad days. Today is neither. Continues to have diffuse abdominal pain that starts in the midabdomen and radiates bilaterally to the back. No nausea. No heartburn. No BM in 2 days but she is not feeling constipated. GI ROS is otherwise negative. Frustrated by prolonged hospitalization. Daughter visited yesterday but not family present at the time of my visit today.   Objective:  Vital  signs in last 24 hours: Temp:  [97.5 F (36.4 C)-98.1 F (36.7 C)] 98 F (36.7 C) (10/04 0736) Pulse Rate:  [56-65] 65 (10/04 0736) Resp:  [17-20] 18 (10/04 0736) BP: (132-170)/(42-67) 170/52 (10/04 0736) SpO2:  [93 %-98 %] 94 % (10/04 0901) Weight:  [68.4 kg] 68.4 kg (10/03 2026) Last BM Date: 06/08/18 General:   Alert,  Well-developed,    in NAD Heart:  Regular rate and rhythm; no murmurs Pulm; Abdomen:  Soft, nontender and nondistended. Normal bowel sounds, without guarding, and without rebound.   Extremities:  Without edema. Neurologic:  Alert and  oriented x4;  grossly normal neurologically. Psych:  Alert and cooperative. Normal mood and affect.  Intake/Output from previous day: 10/03 0701 - 10/04 0700 In: 660 [P.O.:460; IV Piggyback:200] Out: 1450 [Urine:1450] Intake/Output this shift: No intake/output data recorded.  Lab Results: Recent Labs    06/07/18 0452 06/09/18 0708  WBC 13.7* 13.0*  HGB 8.2* 8.3*  HCT 25.7* 26.6*  PLT 339 357   BMET Recent Labs    06/07/18 0452 06/08/18 0648 06/09/18 0708  NA 135 136 138  K 5.2* 5.5* 5.5*  CL 95* 94* 96*  CO2 31 29 29   GLUCOSE 150* 130* 78  BUN 60* 61* 61*  CREATININE 2.47* 2.59* 2.72*  CALCIUM 8.2* 9.0 9.2   LFT Recent Labs    06/08/18 0648  PROT 5.3*  ALBUMIN 2.1*  AST 23  ALT 9  ALKPHOS 32*  BILITOT 0.8   PT/INR No results for input(s): LABPROT, INR in the last 72 hours. Hepatitis Panel No results for input(s): HEPBSAG, HCVAB, HEPAIGM, HEPBIGM in the last 72  hours.  US Renal  Result Date: 06/08/2018 CLINICAL DATA:  Renal failure EXAM: RENAL / URINARY TRACT ULTRASOUND COMPLETE COMPARISON:  None. FINDINGS: Right Kidney: Length: 11.0 cm. Cortical thinning. Echogenicity within normal limits. No hydronephrosis. Left Kidney: Length: 11.3 cm. Cortical thinning. Echogenicity within normal limits. 14 x 11 x 14 mm lower pole cyst. No hydronephrosis. Bladder: Within normal limits. IMPRESSION: 14 mm left lower  pole renal cyst.  No hydronephrosis. Electronically Signed   By: Julian Hy M.D.   On: 06/08/2018 01:11   Dg Abd Portable 1v  Result Date: 06/09/2018 CLINICAL DATA:  Nasogastric tube placement. EXAM: PORTABLE ABDOMEN - 1 VIEW COMPARISON:  Radiographs of May 30, 2018. FINDINGS: Mildly dilated small bowel loops are noted concerning for distal small bowel obstruction or ileus. Distal tip of feeding tube is seen in expected position of distal stomach. Status post cholecystectomy. Phleboliths are noted in the pelvis. Status post stent graft repair of abdominal aortic aneurysm. IMPRESSION: Distal tip of feeding tube seen in expected position of distal stomach. Mildly dilated small bowel loops are noted concerning for distal small bowel obstruction or ileus. Electronically Signed   By: Marijo Conception, M.D.   On: 06/09/2018 11:39      LOS: 12 days   [place]  06/09/2018, 11:51 AM

## 2018-06-09 NOTE — Progress Notes (Signed)
06/09/2018 9:28 AM  Paged Cortrak Tube team.  Team stated that they would come assess patient clogged NG Tube.   Informed attending RN Kami.   Eira Alpert MSN, RN-BC, CNML [facility] Phone: [phone]

## 2018-06-09 NOTE — Progress Notes (Signed)
Cortrak Tube Team Note:  Consult received to place a Cortrak feeding tube.   A 10 F Cortrak tube was placed in the right nare and secured with a nasal bridle at 95cm. Per the Cortrak monitor reading the tube tip is post pyloric.   X-ray is required, abdominal x-ray has been ordered by the Cortrak team. Please confirm tube placement before using the Cortrak tube.   If the tube becomes dislodged please keep the tube and contact the Cortrak team at [email or web address] (password [email or web address]) for replacement.  If after hours and replacement cannot be delayed, place a NG tube and confirm placement with an abdominal x-ray.    Koleen Distance MS, RD, LDN Pager #- [phone] Office#- [phone] After Hours Pager: [phone]

## 2018-06-09 NOTE — Consult Note (Signed)
Consultation Note Date: 06/09/2018   Patient Name: [name]  DOB: 1951/03/04  MRN: [id number]  Age / Sex: 67 y.o., female  PCP: Bernerd Limbo, MD Referring Physician: Samuella Cota, MD  Reason for Consultation: Establishing goals of care and Psychosocial/spiritual support  HPI/Patient Profile: 67 y.o. female  with past medical history of hypertension, hyperlipidemia, DM, COPD on home oxygen, GERD, gout, depression, anxiety, tobacco abuse, atrial fibrillation not on anticoagulants, AAA, CAD, CABG, CHF, CKD4, GI bleeding, mitral valve regurgitation, PAD  admitted on 05/28/2018 with abdominal pain. Patient was recently admitted at the beginning of August d/t GI bleed and gallstone pancreatitis. She had laparoscopic cholecystectomy during that hospitalization.  This admission, diagnosed with pancreatitis - recurrent vs smoldering. CT concerning for pseudocyst and necrosis. Autoimmune pancreatitis has been ruled out. Feeding tube was placed and enteral feeds started. 10/4 tube removed and replaced post pyloric. Patient also has AKI on CKD4 with unclear etiology. Nephrology has been consulted. PMT consulted for GOC, symptom management, and support for family.   Clinical Assessment and Goals of Care: I have reviewed medical records including EPIC notes, labs and imaging, received report from Dr. Sarajane Jews, assessed the patient and then met with patient's daughters, [name] and Almyra Free, and sister, Maudie Mercury,  to discuss diagnosis prognosis, [place], EOL wishes, disposition and options.  Patient was unable to participate in conversation d/t sedation.   I introduced Palliative Medicine as specialized medical care for people living with serious illness. It focuses on providing relief from the symptoms and stress of a serious illness. The goal is to improve quality of life for both the patient and the family.  They tell me patient has not been doing well since  hospitalization in May for AAA repair. At that time patient refused to go to SNF and was discharged home with home health. She has been weak and declining in functional status since then. She is able to complete ADLs but needs assistance with most iADLs. She no longer drives and does not leave the house. They feel patient has been depressed. They also talk about her dwindling appetite and that she did not eat 4 days prior to hospitalization.   We discussed her current illness and what it means in the larger context of her on-going co-morbidities.  We discussed her COPD, CHF, CKD, and frailty baseline. Discussed current pancreatitis and worsening kidney function. Family has good understanding of illness.   I attempted to elicit values and goals of care important to the patient.  [name] tells me that her worst fear is her mother living the rest of her life in pain and misery. They also share that patient does not like medical care and reluctant to see doctors. She has been adamant about not going to a SNF; but family is hopeful she would agree to short-term rehab.   We discussed what future care looks like if patient begins to improve vs care options if patient does not show signs of improvement.   Advance directives, concepts specific to code status, artifical feeding and hydration, and rehospitalization were considered and discussed. Family says patient has been adamant about remaining full code.   Hospice and Palliative Care services outpatient were explained - we discussed when these would be appropriate.   Questions and concerns were addressed. The family was encouraged to call with questions or concerns.   Primary Decision Maker PATIENT - joined by daughters  SUMMARY OF RECOMMENDATIONS   - initial palliative discussion and education - thorough discussion of  patient's illness and different care paths available depending on how patient does in the coming days - PMT will continue to meet and  support family through decisions - plan to see again Sunday  Code Status/Advance Care Planning:  Full code   Symptom Management:   During my evaluation patient was sedated - unable to assess symptoms - family reports pain has been well managed by current regimen  Palliative Prophylaxis:   Aspiration, Delirium Protocol, Frequent Pain Assessment and Oral Care  Additional Recommendations (Limitations, Scope, Preferences):  Full Scope Treatment  Prognosis:   Unable to determine  Discharge Planning: To Be Determined      Primary Diagnoses: Present on Admission: . Anemia . Atrial fibrillation ([place]) . CAD (coronary artery disease) . Chronic diastolic congestive heart failure ([place]) . Chronic kidney disease (CKD), stage IV (severe) ([place]) . COPD (chronic obstructive pulmonary disease) ([place]) . Essential hypertension . GERD (gastroesophageal reflux disease) . Hyperlipidemia . TOBACCO ABUSE . Type II diabetes mellitus with renal manifestations ([place]) . (Resolved) Elevated troponin . (Resolved) Hypertensive urgency . Pancreatitis, recurrent . (Resolved) Hypokalemia . SIRS (systemic inflammatory response syndrome) ([facility])   I have reviewed the medical record, interviewed the patient and family, and examined the patient. The following aspects are pertinent.  Past Medical History:  Diagnosis Date  . AAA (abdominal aortic aneurysm) ([place]) 5/09   3.7x3.3 by u/s 2009  . Abnormal EKG    deep TW inversions chronic  . Anemia   . CAD (coronary artery disease)    a. CABG 03/2011 LIMA to LAD, SVG to OM, SVG to PDA. b. cath 06/25/2015 DES to SVG to rPDA, patent LIMA to LAD, patent SVG to OM  . carotid stenosis 5/09   S/p L CEA ;  60-79% bilat ICA by preCABG dopplers 7/12  . CHF (congestive heart failure) ([place])   . Chronic back pain    "all over" (06/24/2015)  . Chronic bronchitis ([place])    "get it pretty much q yr" (06/24/2015)  . CKD (chronic kidney disease)   . Complication of  anesthesia 1966   "problem w/ether"  . COPD (chronic obstructive pulmonary disease) ([place])   . Coronary artery disease 5/09   s/p CABG 7/12   L-LAD, S-OM, S-PDA  . Depression   . GERD (gastroesophageal reflux disease)    With hiatal hernia  . GIB (gastrointestinal bleeding) 9/11   S/p EGD with cautery at [facility]  . Heart murmur   . History of blood transfusion "a few times"   "all related to anemia" (06/24/2015)  . History of hiatal hernia   . Hyperlipidemia   . Hypertension   . Mitral regurgitation    3+ MR by intraoperative TEE;  s/p MV repair with Dr. Roxy Manns 7/12  . Myocardial infarction [facility]) 2012 "several"  . On home oxygen therapy    "2 liters at night; negative for sleep apnea"  . PAD (peripheral artery disease) ([facility])    Severe; s/p bilateral renal artery stents, moderate in-stent restenosis  . Pancreatitis 05/2018  . Paroxysmal atrial fibrillation ([facility])    coumadin;  echo 9/07: EF 60%, mild LVH;  s/p Cox [facility] 7/12 with LAA clipping  . Subclavian artery stenosis, left ([facility])    stented by Dr. Trula Slade on 10/18 to help flow of her LIMA to LAD  . Type II diabetes mellitus ([place])    Social History   Socioeconomic History  . Marital status: Widowed    Spouse name: Not on file  . Number of  children: Not on file  . Years of education: Not on file  . Highest education level: Not on file  Occupational History  . Occupation: Scientist, water quality in past    Employer: DISABLED  Social Needs  . Financial resource strain: Not on file  . Food insecurity:    Worry: Not on file    Inability: Not on file  . Transportation needs:    Medical: Not on file    Non-medical: Not on file  Tobacco Use  . Smoking status: Current Every Day Smoker    Years: 50.00    Types: Cigarettes  . Smokeless tobacco: Never Used  . Tobacco comment: 3/4 pk per day  Substance and Sexual Activity  . Alcohol use: Yes    Alcohol/week: 0.0 standard drinks    Comment: occasionally  . Drug use: Yes    Types: Marijuana  .  Sexual activity: Not on file  Lifestyle  . Physical activity:    Days per week: Not on file    Minutes per session: Not on file  . Stress: Not on file  Relationships  . Social connections:    Talks on phone: Not on file    Gets together: Not on file    Attends religious service: Not on file    Active member of club or organization: Not on file    Attends meetings of clubs or organizations: Not on file    Relationship status: Not on file  Other Topics Concern  . Not on file  Social History Narrative   Widowed in 2009.   Family History  Problem Relation Age of Onset  . Emphysema Mother   . Heart disease Mother        before age 51  . Hypertension Mother   . Hyperlipidemia Mother   . Heart attack Mother   . AAA (abdominal aortic aneurysm) Mother        rupture  . Heart attack Father 8  . Emphysema Father   . Heart disease Father        before age 30  . Hyperlipidemia Father   . Hypertension Father   . Peripheral vascular disease Father   . Diabetes Brother   . Heart disease Brother        before age 62  . Hyperlipidemia Brother   . Hypertension Brother   . Heart attack Brother        CABG  . Heart disease Other        Vascular disease in grandparents, uncles and dad  . Colon cancer Neg Hx    Scheduled Meds: . ALPRAZolam  0.5 mg Oral QHS  . carvedilol  12.5 mg Oral BID WC  . cholecalciferol  5,000 Units Oral Daily  . cloNIDine  0.1 mg Oral TID  . clopidogrel  75 mg Oral Q breakfast  . DULoxetine  60 mg Oral Daily  . famotidine  20 mg Oral QAC supper  . feeding supplement (VITAL 1.5 CAL)  1,000 mL Per Tube Q24H  . furosemide  20 mg Intravenous BID  . gabapentin  300 mg Oral BID  . hydrALAZINE  100 mg Oral TID  . insulin aspart  0-9 Units Subcutaneous Q4H  . isosorbide mononitrate  15 mg Oral Daily  . lipase/protease/amylase  12,000 Units Oral TID AC  . lipase/protease/amylase)  20,880 Units Per Tube Once   And  . sodium bicarbonate  650 mg Per Tube Once  .  mirtazapine  7.5 mg Oral QHS  .  mometasone-formoterol  2 puff Inhalation BID  . nicotine  21 mg Transdermal Daily   Continuous Infusions: . meropenem (MERREM) IV 1 g (06/08/18 2144)   PRN Meds:.acetaminophen **OR** acetaminophen, alum & mag hydroxide-simeth, hydrALAZINE, HYDROcodone-acetaminophen, HYDROmorphone (DILAUDID) injection, hydrOXYzine, levalbuterol, loperamide, ondansetron (ZOFRAN) IV, polyethylene glycol, zolpidem Allergies  Allergen Reactions  . Isosorbide Other (See Comments)    Can only tolerate in low doses  . Isosorbide Nitrate Other (See Comments)    Can only tolerate in low doses  . Oxycodone-Acetaminophen Itching  . Codeine Itching  . Contrast Media [Iodinated Diagnostic Agents] Itching and Other (See Comments)    Itching of feet  . Ioxaglate Itching and Other (See Comments)    Itching of feet  . Metrizamide Itching and Other (See Comments)    Itching of feet  . Percocet [Oxycodone-Acetaminophen] Itching and Other (See Comments)    Tolerates Hydrocodone   Review of Systems  Unable to perform ROS: Other    Physical Exam  Constitutional: She appears lethargic. She appears ill. No distress.  HENT:  Head: Normocephalic and atraumatic.  Cardiovascular: Normal rate and regular rhythm.  Pulmonary/Chest: Effort normal and breath sounds normal. No respiratory distress.  Abdominal: Soft. Bowel sounds are normal.  Musculoskeletal: She exhibits edema.  Neurological: She appears lethargic.  Skin: Skin is warm and dry.    Vital Signs: BP (!) 170/52 (BP Location: Right Arm)   Pulse 65   Temp 98 F (36.7 C) (Oral)   Resp 18   Ht _0  (1.651 m)   Wt 68.4 kg   SpO2 94%   BMI 25.09 kg/m  Pain Scale: 0-10 POSS *See Group Information*: S-Acceptable,Sleep, easy to arouse Pain Score: Asleep   SpO2: SpO2: 94 % O2 Device:SpO2: 94 % O2 Flow Rate: .O2 Flow Rate (L/min): 2 L/min  IO: Intake/output summary:   Intake/Output Summary (Last 24 hours) at 06/09/2018  1309 Last data filed at 06/09/2018 0900 Gross per 24 hour  Intake 310 ml  Output 1350 ml  Net -1040 ml    LBM: Last BM Date: 06/08/18 Baseline Weight: Weight: 56.6 kg Most recent weight: Weight: 68.4 kg     Palliative Assessment/Data: PPS 20%   Time in: 1515 Time out: 1700 Time Total: 105 minutes Greater than 50%  of this time was spent counseling and coordinating care related to the above assessment and plan.  Juel Burrow, DNP, AGNP-C Palliative Medicine Team [phone] Pager: [phone]

## 2018-06-09 NOTE — Progress Notes (Signed)
C[facility] Team Brief Note   RD able to unclog tube using ~171ml of sterile water and by backing tube out ~3cm. Tube flushing easily now. RN notified.   Koleen Distance MS, RD, LDN Pager #- [phone] Office#- [phone] After Hours Pager: [phone]

## 2018-06-10 LAB — RENAL FUNCTION PANEL
ALBUMIN: 2 g/dL — AB (ref 3.5–5.0)
ANION GAP: 13 (ref 5–15)
BUN: 62 mg/dL — ABNORMAL HIGH (ref 8–23)
CALCIUM: 9.3 mg/dL (ref 8.9–10.3)
CO2: 29 mmol/L (ref 22–32)
Chloride: 96 mmol/L — ABNORMAL LOW (ref 98–111)
Creatinine, Ser: 2.63 mg/dL — ABNORMAL HIGH (ref 0.44–1.00)
GFR, EST AFRICAN AMERICAN: 21 mL/min — AB (ref >60)
GFR, EST NON AFRICAN AMERICAN: 18 mL/min — AB (ref >60)
Glucose, Bld: 123 mg/dL — ABNORMAL HIGH (ref 70–99)
PHOSPHORUS: 6.5 mg/dL — AB (ref 2.5–4.6)
Potassium: 5.1 mmol/L (ref 3.5–5.1)
SODIUM: 138 mmol/L (ref 135–145)

## 2018-06-10 LAB — CULTURE, BLOOD (ROUTINE X 2)
CULTURE: NO GROWTH
Culture: NO GROWTH
Special Requests: ADEQUATE
Special Requests: ADEQUATE

## 2018-06-10 LAB — GLUCOSE, CAPILLARY
GLUCOSE-CAPILLARY: 125 mg/dL — AB (ref 70–99)
Glucose-Capillary: 140 mg/dL — ABNORMAL HIGH (ref 70–99)
Glucose-Capillary: 159 mg/dL — ABNORMAL HIGH (ref 70–99)

## 2018-06-10 LAB — LIPASE, BLOOD: LIPASE: 205 U/L — AB (ref 11–51)

## 2018-06-10 MED ORDER — FUROSEMIDE 10 MG/ML IJ SOLN
40.0000 mg | Freq: Once | INTRAMUSCULAR | Status: AC
  Administered 2018-06-10: 40 mg via INTRAVENOUS
  Filled 2018-06-10: qty 4

## 2018-06-10 NOTE — Progress Notes (Signed)
PROGRESS NOTE  [name] NTI:[id number] DOB: 29-Jul-1951 DOA: 05/28/2018 PCP: Bernerd Limbo, MD  Brief Narrative: 67 year old woman PMH gallstone pancreatitis August 2019, status post laparoscopic cholecystectomy same hospitalization, presented with abdominal pain, nausea, vomiting.  Admitted for recurrent pancreatitis.  She was seen by gastroenterology, cortrak tube was placed and she was started on enteral feeds.  Assessment/Plan Acute pancreatitis, recurrent versus smoldering, severe inflammatory changes noted, new complex fluid collection on CT concerning for pseudocyst, necrosis, SIRS on admission.  History of gallstone pancreatitis August 2019 at which time patient underwent laparoscopic cholecystectomy.  Autoimmune pancreatitis ruled out. --Lipase without significant change, but clinically the patient appears somewhat better today.  Pain is controlled.  Remain n.p.o., continue tube feeds which she appears to be tolerating after tube was advanced. --No evidence of infection.  Completes 14 days of antibiotics today.  AKI on CKD stage IV with modest hyperkalemia, stable --Etiology unclear but somewhat better today.  Urinalysis and renal ultrasound unremarkable.  Hyperkalemia resolved. --Appreciate nephrology evaluation and recommendations.  Lasix today.  Anasarca secondary to hypoalbuminemia, aggressive volume resuscitation, transfusion, underlying chronic diastolic CHF on Lasix as an outpatient, with held on admission. +14 L since admission. --No significant change.  Lasix today per nephrology.  Chronic diastolic CHF managed with Lasix as an outpatient --Appears stable.  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  Colonoscopy planned October 28.  Was not iron deficient August. --Remains stable thus far status post transfusion  Atrial fibrillation.  CHA2DS2-VASc 6. Not on anticoagulation.  CAD, PMH CABG. --Stable.  COPD, chronic hypoxic respiratory failure on  oxygen at home --Remains stable.  Demand ischemia.  PMH CAD, CABG. --Cardiology recommended medical management, consider outpatient stress test.  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  Last A1c 5.05 Jan 2018. --CBG remains stable.  Severe malnutrition, FTT --Continue tube feeds.  Aortic atherosclerosis --No inpatient evaluation indicated.   Appears somewhat better today.  Appreciate gastroenterology, general surgery, nephrology, palliative medicine consultations and recommendations.  Continue to monitor renal function, continue tube feeds, check lipase in a.m. as well as BMP. Hopefully will start to see further improvement.  DVT prophylaxis: SCDs Code Status: full Family Communication: As above Disposition Plan: pending    Murray Hodgkins, MD  [facility] Direct contact: [phone] --Via amion app OR  --[email or web address]; password [email or web address]  7PM-7AM contact night coverage as above 06/10/2018, 1:37 PM  LOS: 13 days   Consultants:  GI  Cardiology  Procedures:  cortrak feeding tube placement on 9/23  Transfusion 1 unit PRBC  Echo Study Conclusions  - Left ventricle: The cavity size was mildly dilated. There was   mild focal basal hypertrophy of the septum. Systolic function was   normal. The estimated ejection fraction was in the range of 60%   to 65%. Wall motion was normal; there were no regional wall   motion abnormalities. Features are consistent with a pseudonormal   left ventricular filling pattern, with concomitant abnormal   relaxation and increased filling pressure (grade 2 diastolic   dysfunction). Doppler parameters are consistent with high   ventricular filling pressure. - Mitral valve: S/P mitral valve repair with moderate mitral   stenosis. There was mild regurgitation. Mean gradient (D): 9 mm   Hg. Valve area by pressure half-time: 2.24 cm^2. Valve area by   continuity equation (using LVOT flow): 1.17 cm^2. -  Left atrium: The atrium was mildly dilated. - Pulmonary arteries: PA peak pressure: 36 mm Hg (S).  Impressions:  -  Normal LVF with mild BSH, mild MR and TR, mild LVE, grade 3   diastolic dysfunction.. S/P mitral valve repair with moderate MS.   No significant change from prior echo The right ventricular   systolic pressure was increased consistent with mild pulmonary   hypertension.  Antimicrobials:  Merrem  Interval history/Subjective: Feels ok today, pain controlled.  Objective: Vitals:  Vitals:   06/10/18 0852 06/10/18 0919  BP:  (!) 158/64  Pulse:  69  Resp:  18  Temp:  98.5 F (36.9 C)  SpO2: 95% 98%    Exam: Constitutional:   . Appears calm and comfortable Respiratory:  . CTA bilaterally, no w/r/r.  . Respiratory effort normal.  Cardiovascular:  . RRR, no m/r/g . 2+ BUE edema . 1+ BLE extremity edema   Abdomen:  . Soft, mild generalized tenderness Psychiatric:  . Mental status o Mood, affect appropriate  I have personally reviewed the following:   Data: . I/O o UOP: 1400 o BM: 10.4  CBG: stable, no lows  Labs: Fasting improved, 5.1.  BUN stable 62.  Creatinine slightly improved, 2.63.  Anion gap within normal limits.  CO2 within normal limits.  Lipase without significant change, 205.  Imaging: Abdominal x-ray showed tube in place, mild ileus.  Other: none    Scheduled Meds: . ALPRAZolam  0.5 mg Oral QHS  . carvedilol  12.5 mg Oral BID WC  . cholecalciferol  5,000 Units Oral Daily  . cloNIDine  0.1 mg Oral TID  . clopidogrel  75 mg Oral Q breakfast  . DULoxetine  60 mg Oral Daily  . famotidine  20 mg Oral QAC supper  . furosemide  40 mg Intravenous Once  . gabapentin  300 mg Oral BID  . hydrALAZINE  100 mg Oral TID  . insulin aspart  0-9 Units Subcutaneous Q4H  . isosorbide mononitrate  15 mg Oral Daily  . lipase/protease/amylase  12,000 Units Oral TID AC  . lipase/protease/amylase)  20,880 Units Per Tube Once   And  . sodium  bicarbonate  650 mg Per Tube Once  . mirtazapine  7.5 mg Oral QHS  . mometasone-formoterol  2 puff Inhalation BID  . nicotine  21 mg Transdermal Daily   Continuous Infusions: . feeding supplement (VITAL AF 1.2 CAL) 1,000 mL (06/09/18 2006)  . ferric gluconate (FERRLECIT/NULECIT) IV 125 mg (06/10/18 1131)    Principal Problem:   Pancreatitis, recurrent Active Problems:   TOBACCO ABUSE   Essential hypertension   Atrial fibrillation (HCC)   COPD (chronic obstructive pulmonary disease) (HCC)   S/P CABG x 3   Anemia   GERD (gastroesophageal reflux disease)   Hyperlipidemia   CAD (coronary artery disease)   Chronic kidney disease (CKD), stage IV (severe) (HCC)   Chronic diastolic congestive heart failure (HCC)   Type II diabetes mellitus with renal manifestations (HCC)   SIRS (systemic inflammatory response syndrome) (HCC)   Protein-calorie malnutrition, severe   Anasarca   Demand ischemia (HCC)   Hyperkalemia   AKI (acute kidney injury) ([place])   Goals of care, counseling/discussion   Palliative care by specialist   LOS: 13 days

## 2018-06-10 NOTE — Progress Notes (Signed)
[facility] Progress Note     Subjective: CC: Pt with pain to RUQ area  Objective: Vital signs in last 24 hours: Temp:  [97.8 F (36.6 C)-98.5 F (36.9 C)] 98.5 F (36.9 C) (10/05 0919) Pulse Rate:  [63-85] 69 (10/05 0919) Resp:  [18] 18 (10/05 0919) BP: (158-185)/(50-64) 158/64 (10/05 0919) SpO2:  [91 %-98 %] 98 % (10/05 0919) Last BM Date: 06/08/18  Intake/Output from previous day: 10/04 0701 - 10/05 0700 In: 1568.2 [NG/GT:594; IV Piggyback:974.2] Out: 1400 [Urine:1400] Intake/Output this shift: No intake/output data recorded.  PE: Gen:  Alert, NAD, pleasant Card:  Regular rate and rhythm, pedal pulses 2+ BL Pulm:  Normal effort, clear to auscultation bilaterally Abd: Soft, TTP RUQ and epigastrum, non-distended, bowel sounds hypoactive quadrants Skin: warm and dry, no rashes  Psych: A&Ox3   Lab Results:  Recent Labs    06/09/18 0708  WBC 13.0*  HGB 8.3*  HCT 26.6*  PLT 357   BMET Recent Labs    06/09/18 0708 06/10/18 0628  NA 138 138  K 5.5* 5.1  CL 96* 96*  CO2 29 29  GLUCOSE 78 123*  BUN 61* 62*  CREATININE 2.72* 2.63*  CALCIUM 9.2 9.3   PT/INR No results for input(s): LABPROT, INR in the last 72 hours. CMP     Component Value Date/Time   NA 138 06/10/2018 0628   K 5.1 06/10/2018 0628   CL 96 (L) 06/10/2018 0628   CO2 29 06/10/2018 0628   GLUCOSE 123 (H) 06/10/2018 0628   BUN 62 (H) 06/10/2018 0628   CREATININE 2.63 (H) 06/10/2018 0628   CREATININE 1.32 (H) 06/17/2015 1257   CALCIUM 9.3 06/10/2018 0628   PROT 5.3 (L) 06/08/2018 0648   ALBUMIN 2.0 (L) 06/10/2018 0628   AST 23 06/08/2018 0648   ALT 9 06/08/2018 0648   ALKPHOS 32 (L) 06/08/2018 0648   BILITOT 0.8 06/08/2018 0648   GFRNONAA 18 (L) 06/10/2018 0628   GFRAA 21 (L) 06/10/2018 0628   Lipase     Component Value Date/Time   LIPASE 205 (H) 06/10/2018 0628       Studies/Results: Dg Abd Portable 1v  Result Date: 06/09/2018 CLINICAL DATA:  Feeding tube  placement EXAM: PORTABLE ABDOMEN - 1 VIEW COMPARISON:  Plain film of the abdomen from earlier same day. FINDINGS: Weighted tip feeding tube has now progressed into the proximal small bowel with tip well-positioned in the expected region of the ligament of Treitz. The mildly distended gas-filled loops of small bowel are again noted within the LEFT abdomen, unchanged in the short-term interval. No evidence of soft tissue mass or abnormal fluid collection. No evidence of free intraperitoneal air. Cholecystectomy clips in the RIGHT upper quadrant. Aorta bi-iliac stents in place. No acute or suspicious osseous finding. IMPRESSION: Weighted tip feeding tube has progressed into the proximal small bowel, with tip now well positioned at the expected region of the ligament of Treitz. Mildly prominent gas-filled loops of small bowel within the LEFT abdomen, similar to the plain film from earlier today, suggesting partial small bowel obstruction or ileus. Electronically Signed   By: Franki Cabot M.D.   On: 06/09/2018 13:23   Dg Abd Portable 1v  Result Date: 06/09/2018 CLINICAL DATA:  Nasogastric tube placement. EXAM: PORTABLE ABDOMEN - 1 VIEW COMPARISON:  Radiographs of May 30, 2018. FINDINGS: Mildly dilated small bowel loops are noted concerning for distal small bowel obstruction or ileus. Distal tip of feeding tube is seen in expected position of  distal stomach. Status post cholecystectomy. Phleboliths are noted in the pelvis. Status post stent graft repair of abdominal aortic aneurysm. IMPRESSION: Distal tip of feeding tube seen in expected position of distal stomach. Mildly dilated small bowel loops are noted concerning for distal small bowel obstruction or ileus. Electronically Signed   By: Marijo Conception, M.D.   On: 06/09/2018 11:39    Anti-infectives: Anti-infectives (From admission, onward)   Start     Dose/Rate Route Frequency Ordered Stop   06/05/18 1445  meropenem (MERREM) 1 g in sodium chloride 0.9  % 100 mL IVPB     1 g 200 mL/hr over 30 Minutes Intravenous Every 12 hours 06/05/18 1433 06/10/18 2159   05/29/18 0600  piperacillin-tazobactam (ZOSYN) IVPB 3.375 g     3.375 g 12.5 mL/hr over 240 Minutes Intravenous Every 8 hours 05/28/18 2227 06/05/18 1600   05/28/18 2230  piperacillin-tazobactam (ZOSYN) IVPB 3.375 g     3.375 g 100 mL/hr over 30 Minutes Intravenous  Once 05/28/18 2227 05/29/18 0011       Assessment/Plan AKI on CKD Anasarca  malnutrition Chronic diastolic HF Acute on chronic anemia  A.fib COPD on home O2 Diarrhea  DM2  Atherosclerosis   Ileus, likely secondary to severe necrotizing pancreatitis with pseudocyst, history of gallstone pancreatitis, s/p lap chole on 04-19-18 - afebrile, VSS - post-pyloric PANDA placed 10/4, tolerating TFs - lipase stable for now, con't to trend  FEN: TF @ goal 60 cc/hr  ID: Zosyn 9/22-9/30, Merrem 9/30-10/5 VTE: SCD's  Code status: full code    LOS: 13 days    Obie Dredge, [facility] Pager: [phone]

## 2018-06-10 NOTE — Progress Notes (Signed)
[facility] Progress Note   Subjective:   Post pyloric tube placed yesterday and tube feeds currently at 6mL/hr.  Still with epigastric discomfort.  GI surgery following.   Palliative care initial consult yesterday.  Plan f/u Sunday to see how things going.   I/O yesterday 1.6 / 1.4  Objective Vitals:   06/09/18 2128 06/10/18 0530 06/10/18 0852 06/10/18 0919  BP:  (!) 162/50  (!) 158/64  Pulse:  85  69  Resp:    18  Temp:  97.8 F (36.6 C)  98.5 F (36.9 C)  TempSrc:  Oral  Oral  SpO2: 93% 93% 95% 98%  Weight:      Height:       Physical Exam General: chronically ill appearing but comfortable at 45 degrees in bed Heart: no rub, RRR Lungs: normal WOB, dec BS bases Abdomen: soft, mildly TTP epigastrium Extremities:  Anasarca 4 limbs, 2+ pitting; LUE AVF +t/b  Additional Objective Labs: Basic Metabolic Panel: Recent Labs  Lab 06/05/18 0538 06/06/18 0551  06/08/18 0648 06/09/18 0708 06/10/18 0628  NA 136 135   < > 136 138 138  K 5.0 5.2*   < > 5.5* 5.5* 5.1  CL 93* 94*   < > 94* 96* 96*  CO2 28 32   < > 29 29 29   GLUCOSE 149* 133*   < > 130* 78 123*  BUN 51* 55*   < > 61* 61* 62*  CREATININE 2.07* 2.23*   < > 2.59* 2.72* 2.63*  CALCIUM 8.4* 8.1*   < > 9.0 9.2 9.3  PHOS 5.7* 6.3*  --   --   --  6.5*   < > = values in this interval not displayed.   Liver Function Tests: Recent Labs  Lab 06/06/18 0551 06/07/18 0452 06/08/18 0648 06/10/18 0628  AST 16 19 23   --   ALT 9 10 9   --   ALKPHOS 33* 39 32*  --   BILITOT 0.4 0.6 0.8  --   PROT 4.9* 5.1* 5.3*  --   ALBUMIN 1.6* 2.0* 2.1* 2.0*   Recent Labs  Lab 06/08/18 0648 06/09/18 0708 06/10/18 0628  LIPASE 677* 179* 205*   CBC: Recent Labs  Lab 06/04/18 0537 06/05/18 0538 06/06/18 0551 06/07/18 0452 06/09/18 0708  WBC 13.3* 15.1* 13.6* 13.7* 13.0*  HGB 8.1* 8.2* 7.6* 8.2* 8.3*  HCT 26.0* 26.1* 24.5* 25.7* 26.6*  MCV 98.9 98.5 98.4 98.5 100.8*  PLT 241 261 269 339 357   Blood  Culture    Component Value Date/Time   SDES BLOOD RIGHT HAND 06/05/2018 1453   SPECREQUEST  06/05/2018 1453    BOTTLES DRAWN AEROBIC ONLY Blood Culture adequate volume   CULT  06/05/2018 1453    NO GROWTH 4 DAYS Performed at [facility], [place] [address], [place], [place] [address]    REPTSTATUS PENDING 06/05/2018 1453    Cardiac Enzymes: No results for input(s): CKTOTAL, CKMB, CKMBINDEX, TROPONINI in the last 168 hours. CBG: Recent Labs  Lab 06/09/18 1955 06/09/18 2322 06/10/18 0456 06/10/18 0736 06/10/18 1133  GLUCAP 190* 155* 140* 125* 159*   Iron Studies: No results for input(s): IRON, TIBC, TRANSFERRIN, FERRITIN in the last 72 hours. @lablastinr3 @ Studies/Results: Dg Abd Portable 1v  Result Date: 06/09/2018 CLINICAL DATA:  Feeding tube placement EXAM: PORTABLE ABDOMEN - 1 VIEW COMPARISON:  Plain film of the abdomen from earlier same day. FINDINGS: Weighted tip feeding tube has now progressed into the proximal small  bowel with tip well-positioned in the expected region of the ligament of Treitz. The mildly distended gas-filled loops of small bowel are again noted within the LEFT abdomen, unchanged in the short-term interval. No evidence of soft tissue mass or abnormal fluid collection. No evidence of free intraperitoneal air. Cholecystectomy clips in the RIGHT upper quadrant. Aorta bi-iliac stents in place. No acute or suspicious osseous finding. IMPRESSION: Weighted tip feeding tube has progressed into the proximal small bowel, with tip now well positioned at the expected region of the ligament of Treitz. Mildly prominent gas-filled loops of small bowel within the LEFT abdomen, similar to the plain film from earlier today, suggesting partial small bowel obstruction or ileus. Electronically Signed   By: Franki Cabot M.D.   On: 06/09/2018 13:23   Dg Abd Portable 1v  Result Date: 06/09/2018 CLINICAL DATA:  Nasogastric tube placement. EXAM: PORTABLE ABDOMEN - 1 VIEW  COMPARISON:  Radiographs of May 30, 2018. FINDINGS: Mildly dilated small bowel loops are noted concerning for distal small bowel obstruction or ileus. Distal tip of feeding tube is seen in expected position of distal stomach. Status post cholecystectomy. Phleboliths are noted in the pelvis. Status post stent graft repair of abdominal aortic aneurysm. IMPRESSION: Distal tip of feeding tube seen in expected position of distal stomach. Mildly dilated small bowel loops are noted concerning for distal small bowel obstruction or ileus. Electronically Signed   By: Marijo Conception, M.D.   On: 06/09/2018 11:39   Medications: . feeding supplement (VITAL AF 1.2 CAL) 1,000 mL (06/09/18 2006)  . ferric gluconate (FERRLECIT/NULECIT) IV 125 mg (06/10/18 1131)   . ALPRAZolam  0.5 mg Oral QHS  . carvedilol  12.5 mg Oral BID WC  . cholecalciferol  5,000 Units Oral Daily  . cloNIDine  0.1 mg Oral TID  . clopidogrel  75 mg Oral Q breakfast  . DULoxetine  60 mg Oral Daily  . famotidine  20 mg Oral QAC supper  . gabapentin  300 mg Oral BID  . hydrALAZINE  100 mg Oral TID  . insulin aspart  0-9 Units Subcutaneous Q4H  . isosorbide mononitrate  15 mg Oral Daily  . lipase/protease/amylase  12,000 Units Oral TID AC  . lipase/protease/amylase)  20,880 Units Per Tube Once   And  . sodium bicarbonate  650 mg Per Tube Once  . mirtazapine  7.5 mg Oral QHS  . mometasone-formoterol  2 puff Inhalation BID  . nicotine  21 mg Transdermal Daily    Assessment/Plan: 1.  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  In the setting of diuresis she has had rising creatinine.  Yesterday held diuretic as she looked intravascularly dry despite anasarca (20lbs+).  Creatinine a bit improved.  Started tube feeds.  Will dose with Lasix 40 IV today.  Fine line to walk here and should be diuresed as able.  Working on nutrition should help.  She has a L BC AVF which  feels small and I doubt it's useable.   Hopefully we won't reach that point.  Of note husband died shortly after starting dialysis and this is very anxiety provoking for family and patient.  2.  HTN: Generally in the 130-150s recently which is acceptably controlled currently. Coreg 12.5 BID, clonidine 0.1 TID, hydralazine 100 TID, imdur 15.   3.  Hyperkalemia: modest, 5.5 in the past 2 days, 5.1 today. If rises further will use K binder.  On tube  feeds ? If K controlled formula.   4.  Anemia:  Hb 8.3, sat 16% on 10/2.  Ferritin ~1500, folate 12.  Stable over the hospitalization after transfusion.  Giving IV iron and arensp 40 given 10/4.  Jannifer Hick MD 06/10/2018, 1:20 PM  [facility] Pager: [phone]

## 2018-06-11 LAB — BASIC METABOLIC PANEL
ANION GAP: 11 (ref 5–15)
BUN: 65 mg/dL — ABNORMAL HIGH (ref 8–23)
CALCIUM: 8.8 mg/dL — AB (ref 8.9–10.3)
CO2: 30 mmol/L (ref 22–32)
Chloride: 96 mmol/L — ABNORMAL LOW (ref 98–111)
Creatinine, Ser: 2.47 mg/dL — ABNORMAL HIGH (ref 0.44–1.00)
GFR, EST AFRICAN AMERICAN: 22 mL/min — AB (ref >60)
GFR, EST NON AFRICAN AMERICAN: 19 mL/min — AB (ref >60)
Glucose, Bld: 155 mg/dL — ABNORMAL HIGH (ref 70–99)
Potassium: 5.4 mmol/L — ABNORMAL HIGH (ref 3.5–5.1)
Sodium: 137 mmol/L (ref 135–145)

## 2018-06-11 LAB — LIPASE, BLOOD: LIPASE: 1307 U/L — AB (ref 11–51)

## 2018-06-11 MED ORDER — SODIUM CHLORIDE 0.9 % IV SOLN
INTRAVENOUS | Status: DC
  Administered 2018-06-11 – 2018-06-13 (×3): via INTRAVENOUS

## 2018-06-11 NOTE — Progress Notes (Signed)
[facility] Progress Note   Subjective:   Post pyloric tube placed Friday and tube feeds initiated but lipase up to ~1100 today and so held.  Plans for TPN.   She denies increased abd pain.   I/Os yesterday 1.8 /2.1 with lasix 40 IV   Objective Vitals:   06/11/18 0536 06/11/18 0719 06/11/18 0751 06/11/18 0937  BP: (!) 149/41  (!) 77/60 (!) 162/43  Pulse: 61  64   Resp: 16  20   Temp: 97.9 F (36.6 C)  (!) 97.3 F (36.3 C)   TempSrc: Oral  Oral   SpO2: 98% 98% 98%   Weight:      Height:       Physical Exam General: chronically ill appearing but comfortable at 45 degrees in bed Heart: no rub, RRR Lungs: normal WOB, dec BS bases Abdomen: soft, mildly TTP epigastrium Extremities:  Anasarca 4 limbs, 2+ pitting; LUE AVF +t/b  Additional Objective Labs: Basic Metabolic Panel: Recent Labs  Lab 06/05/18 0538 06/06/18 0551  06/09/18 0708 06/10/18 0628 06/11/18 0444  NA 136 135   < > 138 138 137  K 5.0 5.2*   < > 5.5* 5.1 5.4*  CL 93* 94*   < > 96* 96* 96*  CO2 28 32   < > 29 29 30   GLUCOSE 149* 133*   < > 78 123* 155*  BUN 51* 55*   < > 61* 62* 65*  CREATININE 2.07* 2.23*   < > 2.72* 2.63* 2.47*  CALCIUM 8.4* 8.1*   < > 9.2 9.3 8.8*  PHOS 5.7* 6.3*  --   --  6.5*  --    < > = values in this interval not displayed.   Liver Function Tests: Recent Labs  Lab 06/06/18 0551 06/07/18 0452 06/08/18 0648 06/10/18 0628  AST 16 19 23   --   ALT 9 10 9   --   ALKPHOS 33* 39 32*  --   BILITOT 0.4 0.6 0.8  --   PROT 4.9* 5.1* 5.3*  --   ALBUMIN 1.6* 2.0* 2.1* 2.0*   Recent Labs  Lab 06/09/18 0708 06/10/18 0628 06/11/18 0444  LIPASE 179* 205* 1,307*   CBC: Recent Labs  Lab 06/05/18 0538 06/06/18 0551 06/07/18 0452 06/09/18 0708  WBC 15.1* 13.6* 13.7* 13.0*  HGB 8.2* 7.6* 8.2* 8.3*  HCT 26.1* 24.5* 25.7* 26.6*  MCV 98.5 98.4 98.5 100.8*  PLT 261 269 339 357   Blood Culture    Component Value Date/Time   SDES BLOOD RIGHT HAND 06/05/2018 1453    SPECREQUEST  06/05/2018 1453    BOTTLES DRAWN AEROBIC ONLY Blood Culture adequate volume   CULT  06/05/2018 1453    NO GROWTH 5 DAYS Performed at [facility], [place] [address], [place], [place] [address]    REPTSTATUS 06/10/2018 FINAL 06/05/2018 1453    Cardiac Enzymes: No results for input(s): CKTOTAL, CKMB, CKMBINDEX, TROPONINI in the last 168 hours. CBG: Recent Labs  Lab 06/09/18 1955 06/09/18 2322 06/10/18 0456 06/10/18 0736 06/10/18 1133  GLUCAP 190* 155* 140* 125* 159*   Iron Studies: No results for input(s): IRON, TIBC, TRANSFERRIN, FERRITIN in the last 72 hours. @lablastinr3 @ Studies/Results: Dg Abd Portable 1v  Result Date: 06/09/2018 CLINICAL DATA:  Feeding tube placement EXAM: PORTABLE ABDOMEN - 1 VIEW COMPARISON:  Plain film of the abdomen from earlier same day. FINDINGS: Weighted tip feeding tube has now progressed into the proximal small bowel with tip well-positioned in the expected  region of the ligament of Treitz. The mildly distended gas-filled loops of small bowel are again noted within the LEFT abdomen, unchanged in the short-term interval. No evidence of soft tissue mass or abnormal fluid collection. No evidence of free intraperitoneal air. Cholecystectomy clips in the RIGHT upper quadrant. Aorta bi-iliac stents in place. No acute or suspicious osseous finding. IMPRESSION: Weighted tip feeding tube has progressed into the proximal small bowel, with tip now well positioned at the expected region of the ligament of Treitz. Mildly prominent gas-filled loops of small bowel within the LEFT abdomen, similar to the plain film from earlier today, suggesting partial small bowel obstruction or ileus. Electronically Signed   By: Franki Cabot M.D.   On: 06/09/2018 13:23   Medications: . sodium chloride 75 mL/hr at 06/11/18 1003  . ferric gluconate (FERRLECIT/NULECIT) IV Stopped (06/10/18 1231)   . ALPRAZolam  0.5 mg Oral QHS  . carvedilol  12.5 mg Oral BID WC  .  cholecalciferol  5,000 Units Oral Daily  . cloNIDine  0.1 mg Oral TID  . clopidogrel  75 mg Oral Q breakfast  . DULoxetine  60 mg Oral Daily  . famotidine  20 mg Oral QAC supper  . gabapentin  300 mg Oral BID  . hydrALAZINE  100 mg Oral TID  . insulin aspart  0-9 Units Subcutaneous Q4H  . isosorbide mononitrate  15 mg Oral Daily  . lipase/protease/amylase  12,000 Units Oral TID AC  . lipase/protease/amylase)  20,880 Units Per Tube Once   And  . sodium bicarbonate  650 mg Per Tube Once  . mirtazapine  7.5 mg Oral QHS  . mometasone-formoterol  2 puff Inhalation BID  . nicotine  21 mg Transdermal Daily    Assessment/Plan: 1.  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  In the setting of daily diuresis she has had rising creatinine likely due to intravascular depletion after 3rd spacing due to hypoalbuminemia.  Fine line to walk here and should be diuresed as able -- Cr stable today so lasix 40 IV again today.  Working on nutrition should help - failed tube feeds and will not be started on TPN.  She has a L BC AVF which feels small and I doubt it's useable.   Hopefully we won't reach that point and if we do will need to have a discussion of goals of care in light of her quite debilitated status -- I have indicated that to her family.  Palliative care is following too.  Of note husband died shortly after starting dialysis and this is very anxiety provoking for family and patient.  2.  HTN: Generally in the 130-150s recently which is acceptably controlled currently - 1 outlier this am 77/60 but unclear if this was true; f/u BP 2 hrs later 160/43. Coreg 12.5 BID, clonidine 0.1 TID, hydralazine 100 TID, imdur 15.   3.  Hyperkalemia: modest, 5.4 today. CTM.   4.  Anemia:  Hb 8.3, sat 16% on 10/2.  Ferritin ~1500, folate 12.  Stable over the hospitalization after transfusion.  Giving IV iron and arensp 40 given 10/4.   Jannifer Hick MD 06/11/2018, 12:08 PM  [facility] Pager: [phone]

## 2018-06-11 NOTE — Progress Notes (Signed)
Daily Progress Note   Patient Name: [name]       Date: 06/11/2018 DOB: 07-31-1951  Age: 67 y.o. MRN#: [id number] Attending Physician: Samuella Cota, MD Primary Care Physician: Bernerd Limbo, MD Admit Date: 05/28/2018  Reason for Consultation/Follow-up: Establishing goals of care and Psychosocial/spiritual support  Subjective: Pain is better, dry mouth  Length of Stay: 14  Current Medications: Scheduled Meds:  . ALPRAZolam  0.5 mg Oral QHS  . carvedilol  12.5 mg Oral BID WC  . cholecalciferol  5,000 Units Oral Daily  . cloNIDine  0.1 mg Oral TID  . clopidogrel  75 mg Oral Q breakfast  . DULoxetine  60 mg Oral Daily  . famotidine  20 mg Oral QAC supper  . gabapentin  300 mg Oral BID  . hydrALAZINE  100 mg Oral TID  . insulin aspart  0-9 Units Subcutaneous Q4H  . isosorbide mononitrate  15 mg Oral Daily  . lipase/protease/amylase  12,000 Units Oral TID AC  . lipase/protease/amylase)  20,880 Units Per Tube Once   And  . sodium bicarbonate  650 mg Per Tube Once  . mirtazapine  7.5 mg Oral QHS  . mometasone-formoterol  2 puff Inhalation BID  . nicotine  21 mg Transdermal Daily    Continuous Infusions: . sodium chloride 75 mL/hr at 06/11/18 1003  . ferric gluconate (FERRLECIT/NULECIT) IV Stopped (06/10/18 1231)    PRN Meds: acetaminophen **OR** acetaminophen, alum & mag hydroxide-simeth, hydrALAZINE, HYDROcodone-acetaminophen, HYDROmorphone (DILAUDID) injection, hydrOXYzine, levalbuterol, loperamide, ondansetron (ZOFRAN) IV, polyethylene glycol, zolpidem  Physical Exam  Constitutional: She is oriented to person, place, and time. She appears ill. No distress.  HENT:  Head: Normocephalic and atraumatic.  Feeding tube In place  Cardiovascular: Normal rate and regular rhythm.    Pulmonary/Chest: Effort normal and breath sounds normal. No respiratory distress.  Abdominal: Soft. Normal appearance.  Musculoskeletal: She exhibits edema.  Neurological: She is alert and oriented to person, place, and time.  Skin: Skin is warm and dry.            Vital Signs: BP (!) 162/43 (BP Location: Right Arm)   Pulse 64   Temp (!) 97.3 F (36.3 C) (Oral)   Resp 20   Ht 5\' 5"  (1.651 m)   Wt 67.1 kg   SpO2 98%   BMI 24.62 kg/m  SpO2: SpO2: 98 % O2 Device: O2 Device: Nasal Cannula O2 Flow Rate: O2 Flow Rate (L/min): 2 L/min  Intake/output summary:   Intake/Output Summary (Last 24 hours) at 06/11/2018 1020 Last data filed at 06/11/2018 1000 Gross per 24 hour  Intake 1592 ml  Output 1800 ml  Net -208 ml   LBM: Last BM Date: 06/11/18 Baseline Weight: Weight: 56.6 kg Most recent weight: Weight: 67.1 kg       Palliative Assessment/Data: PPS 20%    Flowsheet Rows     Most Recent Value  Intake Tab  Referral Department  Hospitalist  Unit at Time of Referral  Cardiac/Telemetry Unit  Palliative Care Primary Diagnosis  Sepsis/Infectious Disease  Date Notified  06/08/18  Reason for referral  Clarify Goals of Care  Date of Admission  05/28/18  Date first seen by Palliative Care  06/09/18  # of days Palliative referral response time  1 Day(s)  # of days IP prior to Palliative referral  11  Clinical Assessment  Palliative Performance Scale Score  20%  Psychosocial & Spiritual Assessment  Palliative Care Outcomes  Patient/Family meeting held?  Yes  Who was at the meeting?  2 daughters, sister  Palliative Care Outcomes  Clarified goals of care, Provided psychosocial or spiritual support      Patient Active Problem List   Diagnosis Date Noted  . AKI (acute kidney injury) ([place]) 06/09/2018  . Goals of care, counseling/discussion   . Palliative care by specialist   . Hyperkalemia 06/08/2018  . Anasarca 06/07/2018  . Demand ischemia ([place]) 06/07/2018  .  Protein-calorie malnutrition, severe 05/29/2018  . Type II diabetes mellitus with renal manifestations ([place]) 05/28/2018  . Pancreatitis, recurrent 05/28/2018  . SIRS (systemic inflammatory response syndrome) ([place]) 05/28/2018  . Pancreatitis 05/28/2018  . Volume overload 04/17/2018  . Symptomatic anemia 04/14/2018  . Acute gallstone pancreatitis 04/14/2018  . Acute cholecystitis 04/14/2018  . Gastrointestinal hemorrhage   . Fatigue associated with anemia 04/09/2018  . Hypoglycemia 01/19/2018  . Type II diabetes mellitus ([place]) 01/19/2018  . Nausea with vomiting 01/19/2018  . Uncontrolled hypertension 01/19/2018  . Hyperlipidemia 01/19/2018  . CAD (coronary artery disease) 01/19/2018  . COPD with hypoxia ([place]) 01/19/2018  . Chronic kidney disease (CKD), stage IV (severe) ([place]) 01/19/2018  . Retroperitoneal hematoma 01/19/2018  . Acute blood loss anemia 01/19/2018  . Chronic diastolic congestive heart failure ([place]) 01/19/2018  . AAA (abdominal aortic aneurysm) ([place]) 01/16/2018  . Elevated brain natriuretic peptide (BNP) level 10/07/2015  . Diabetes mellitus ([place]) 10/07/2015  . Pain in the chest   . PAD (peripheral artery disease) ([place]) 07/29/2015  . Chest pain 07/04/2015  . Acute on chronic kidney failure ([place]) 07/04/2015  . GERD (gastroesophageal reflux disease) 07/04/2015  . Anemia 06/26/2015  . Coronary artery disease involving autologous vein coronary bypass graft with unstable angina pectoris ([place]) 06/24/2015  . Angina at rest [facility])   . Subclavian artery stenosis, left ([place]) 06/23/2015  . AAA (abdominal aortic aneurysm) without rupture ([place]) 12/10/2014  . Subclavian steal syndrome 12/10/2014  . Renal artery stenosis ([place]) 09/04/2013  . Carotid artery stenosis 09/28/2012  . Mitral valve disorder 06/23/2011  . S/P CABG x 3 06/16/2011  . S/P Maze operation for atrial fibrillation 06/16/2011  . S/P MVR (mitral valve repair) 06/16/2011  . Follow-up examination following surgery  03/29/2011  . CKD (chronic kidney disease) 03/03/2011  . COPD (chronic obstructive pulmonary disease) ([place]) 03/03/2011  . Chronic diastolic heart failure ([place]) 02/26/2011  . Hypotension 02/26/2011  . NSTEMI (non-ST elevated myocardial infarction) ([place]) 02/26/2011  . Long term (current) use of anticoagulants 12/17/2010  . OBSTRUCTIVE SLEEP APNEA 09/04/2010  . HYPOXEMIA 09/04/2010  . SNORING 08/05/2010  . Atrial fibrillation ([place]) 12/09/2009  . WEIGHT LOSS 03/28/2009  . PALPITATIONS 03/28/2009  . PVD 11/21/2008  . HYPERLIPIDEMIA-MIXED 11/20/2008  . TOBACCO ABUSE 11/20/2008  . Essential hypertension 11/20/2008  . Coronary artery disease involving native heart with angina pectoris ([place]) 11/20/2008  . ABDOMINAL AORTIC ANEURYSM 11/20/2008    Palliative Care Assessment & Plan   HPI: 67 y.o. female  with past medical history of hypertension, hyperlipidemia, DM, COPD on home oxygen, GERD, gout, depression, anxiety, tobacco abuse, atrial fibrillation not on anticoagulants, AAA, CAD, CABG, CHF, CKD4, GI bleeding, mitral valve regurgitation, PAD  admitted on 05/28/2018 with abdominal pain. Patient was recently admitted at the  beginning of August d/t GI bleed and gallstone pancreatitis. She had laparoscopic cholecystectomy during that hospitalization.  This admission, diagnosed with pancreatitis - recurrent vs smoldering. CT concerning for pseudocyst and necrosis. Autoimmune pancreatitis has been ruled out. Feeding tube was placed and enteral feeds started. 10/4 tube removed and replaced post pyloric. Patient also has AKI on CKD4 with unclear etiology. Nephrology has been consulted. PMT consulted for GOC, symptom management, and support for family.   Assessment: Follow up with patient and family to continue to offer support and assist with goals of care.   Patient tells me she feels much better - abdominal pain better. Only complaint is dry mouth - offered mouth moisturizer gel which she reports is  helpful.  Spoke with patient's daughter [name] to follow up from meeting Fri and address questions/concerns. [name] is pleased with improvement in abdominal pain and hopeful she continues to improve. We discussed uncertainty of long-term outcomes. [name] understands. She expresses concerns about finances and disposition. She is hopeful to speak with case Freight forwarder and Education officer, [employer]. At this time, she does not want mother to go to SNF and is working with family to plan for patient to live with them, have home health, and also hire private pay aids.   Questions and concerns were addressed. The family was encouraged to call with questions or concerns.   Recommendations/Plan:  PMT continuing to support patient and family throughout hospitalization - at this time they are hopeful for improvement as patient is experiencing less pain  They are working on plan to take patient home with home health and private pay aids  PMT will see again later in week to continue to offer support, build rapport, and assist with goals of care depending on patient's clinical course  Symptoms well managed - only complaint is dry mouth relieved with mouth moisturizer gel given to patient  Goals of Care and Additional Recommendations:  Limitations on Scope of Treatment: Full Scope Treatment  Code Status:  Full code  Prognosis:   Unable to determine  Discharge Planning:  Home with Home Health??  Care plan was discussed with patient and daughter  Thank you for allowing the Palliative Medicine Team to assist in the care of this patient.   Total Time 20 minutes Prolonged Time Billed  no       Greater than 50%  of this time was spent counseling and coordinating care related to the above assessment and plan.  Amy Burrow, DNP, [facility] Palliative Medicine Team Team Phone # [phone]  Pager [phone]

## 2018-06-11 NOTE — Progress Notes (Signed)
Subjective/Chief Complaint: Reports less abdominal discomfort this morning Tolerating tube feeds   Objective: Vital signs in last 24 hours: Temp:  [97.3 F (36.3 C)-98.5 F (36.9 C)] 97.3 F (36.3 C) (10/06 0751) Pulse Rate:  [61-70] 64 (10/06 0751) Resp:  [15-20] 20 (10/06 0751) BP: (77-158)/(41-80) 77/60 (10/06 0751) SpO2:  [98 %-99 %] 98 % (10/06 0751) Weight:  [67.1 kg] 67.1 kg (10/06 0500) Last BM Date: 06/10/18  Intake/Output from previous day: 10/05 0701 - 10/06 0700 In: 1832 [P.O.:360; NG/GT:1472] Out: 2050 [Urine:2050] Intake/Output this shift: No intake/output data recorded.  Exam: Looks comfortable, awake and alert Abdomen soft, mildly tender in epigastrium, RUQ  Lab Results:  Recent Labs    06/09/18 0708  WBC 13.0*  HGB 8.3*  HCT 26.6*  PLT 357   BMET Recent Labs    06/10/18 0628 06/11/18 0444  NA 138 137  K 5.1 5.4*  CL 96* 96*  CO2 29 30  GLUCOSE 123* 155*  BUN 62* 65*  CREATININE 2.63* 2.47*  CALCIUM 9.3 8.8*   PT/INR No results for input(s): LABPROT, INR in the last 72 hours. ABG No results for input(s): PHART, HCO3 in the last 72 hours.  Invalid input(s): PCO2, PO2  Studies/Results: Dg Abd Portable 1v  Result Date: 06/09/2018 CLINICAL DATA:  Feeding tube placement EXAM: PORTABLE ABDOMEN - 1 VIEW COMPARISON:  Plain film of the abdomen from earlier same day. FINDINGS: Weighted tip feeding tube has now progressed into the proximal small bowel with tip well-positioned in the expected region of the ligament of Treitz. The mildly distended gas-filled loops of small bowel are again noted within the LEFT abdomen, unchanged in the short-term interval. No evidence of soft tissue mass or abnormal fluid collection. No evidence of free intraperitoneal air. Cholecystectomy clips in the RIGHT upper quadrant. Aorta bi-iliac stents in place. No acute or suspicious osseous finding. IMPRESSION: Weighted tip feeding tube has progressed into the  proximal small bowel, with tip now well positioned at the expected region of the ligament of Treitz. Mildly prominent gas-filled loops of small bowel within the LEFT abdomen, similar to the plain film from earlier today, suggesting partial small bowel obstruction or ileus. Electronically Signed   By: Franki Cabot M.D.   On: 06/09/2018 13:23   Dg Abd Portable 1v  Result Date: 06/09/2018 CLINICAL DATA:  Nasogastric tube placement. EXAM: PORTABLE ABDOMEN - 1 VIEW COMPARISON:  Radiographs of May 30, 2018. FINDINGS: Mildly dilated small bowel loops are noted concerning for distal small bowel obstruction or ileus. Distal tip of feeding tube is seen in expected position of distal stomach. Status post cholecystectomy. Phleboliths are noted in the pelvis. Status post stent graft repair of abdominal aortic aneurysm. IMPRESSION: Distal tip of feeding tube seen in expected position of distal stomach. Mildly dilated small bowel loops are noted concerning for distal small bowel obstruction or ileus. Electronically Signed   By: Marijo Conception, M.D.   On: 06/09/2018 11:39    Anti-infectives: Anti-infectives (From admission, onward)   Start     Dose/Rate Route Frequency Ordered Stop   06/05/18 1445  meropenem (MERREM) 1 g in sodium chloride 0.9 % 100 mL IVPB     1 g 200 mL/hr over 30 Minutes Intravenous Every 12 hours 06/05/18 1433 06/10/18 1019   05/29/18 0600  piperacillin-tazobactam (ZOSYN) IVPB 3.375 g     3.375 g 12.5 mL/hr over 240 Minutes Intravenous Every 8 hours 05/28/18 2227 06/05/18 1600   05/28/18 2230  piperacillin-tazobactam (  ZOSYN) IVPB 3.375 g     3.375 g 100 mL/hr over 30 Minutes Intravenous  Once 05/28/18 2227 05/29/18 0011      Assessment/Plan: Necrotizing pancreatitis,pseudocyst, necrosis  No acute changes Tolerating tube feeds post pyloric Lipase up to 1,307 today. May need repeat CT this week but creatinine still elevated.   LOS: 14 days    [name]  A 06/11/2018

## 2018-06-11 NOTE — Progress Notes (Signed)
PROGRESS NOTE  [name] RAQ:[id number] DOB: Jul 17, 1951 DOA: 05/28/2018 PCP: Bernerd Limbo, MD  Brief Narrative: 67 year old woman PMH gallstone pancreatitis August 2019, status post laparoscopic cholecystectomy same hospitalization, presented with abdominal pain, nausea, vomiting.  Admitted for recurrent pancreatitis.  She was seen by gastroenterology, cortrak tube was placed and she was started on enteral feeds.  Assessment/Plan Acute pancreatitis, recurrent versus smoldering, severe inflammatory changes noted, new complex fluid collection on CT concerning for pseudocyst, necrosis, SIRS on admission.  History of gallstone pancreatitis August 2019 at which time patient underwent laparoscopic cholecystectomy.  Autoimmune pancreatitis ruled out. --Subjectively better, no abdominal pain on exam but lipase now up over 1000.  Significance unclear but will hold tube feeds again and discussed with GI. --No evidence of infection, completed 2 weeks antibiotics yesterday 10/5.  Mild ileus, appreciate general surgery evaluation and ongoing recommendations.  AKI on CKD stage IV with modest hyperkalemia, stable --Somewhat better again today although modest hyperkalemia has returned.  Urinalysis and renal ultrasound were unremarkable.  Continue management per nephrology.    Anasarca secondary to hypoalbuminemia, aggressive volume resuscitation, transfusion, underlying chronic diastolic CHF on Lasix as an outpatient, with held on admission. +14 L since admission. --Perhaps slightly better in the legs.  Continue management per nephrology.  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  Colonoscopy planned October 28.  Was not iron deficient August. --Stable status post transfusion.  Check CBC in a.m.  Atrial fibrillation.  CHA2DS2-VASc 6. Not on anticoagulation.  CAD, PMH CABG. --Stable.  COPD, chronic hypoxic  respiratory failure on oxygen at home --Remains stable  Demand ischemia.  PMH CAD, CABG. --Cardiology recommended medical management, consider outpatient stress test.  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  Last A1c 5.05 Jan 2018. --CBG stable  Severe malnutrition, FTT --Hold tube feeds check lipase in a.m.  Aortic atherosclerosis --No inpatient evaluation indicated.   Appears somewhat better today although lipase is significantly worse.  Renal function with minimal improvement, now has hyperkalemia again.  Prognosis remains guarded.  DVT prophylaxis: SCDs Code Status: full Family Communication: As above Disposition Plan: pending    Amy Hodgkins, MD  [facility] Direct contact: [phone] --Via amion app OR  --[email or web address]; password [email or web address]  7PM-7AM contact night coverage as above 06/11/2018, 9:43 AM  LOS: 14 days   Consultants:  GI  Cardiology  Procedures:  cortrak feeding tube placement on 9/23  Transfusion 1 unit PRBC  Echo Study Conclusions  - Left ventricle: The cavity size was mildly dilated. There was   mild focal basal hypertrophy of the septum. Systolic function was   normal. The estimated ejection fraction was in the range of 60%   to 65%. Wall motion was normal; there were no regional wall   motion abnormalities. Features are consistent with a pseudonormal   left ventricular filling pattern, with concomitant abnormal   relaxation and increased filling pressure (grade 2 diastolic   dysfunction). Doppler parameters are consistent with high   ventricular filling pressure. - Mitral valve: S/P mitral valve repair with moderate mitral   stenosis. There was mild regurgitation. Mean gradient (D): 9 mm   Hg. Valve area by pressure half-time: 2.24 cm^2. Valve area by   continuity equation (using LVOT flow): 1.17 cm^2. - Left atrium: The atrium was mildly dilated. - Pulmonary arteries: PA peak pressure: 36 mm Hg  (S).  Impressions:  - Normal LVF with mild  [name], mild MR and TR, mild LVE, grade 3   diastolic dysfunction.. S/P mitral valve repair with moderate MS.   No significant change from prior echo The right ventricular   systolic pressure was increased consistent with mild pulmonary   hypertension.  Antimicrobials:  Merrem  Interval history/Subjective: Feels better today, no pain. Breathing ok.  Objective: Vitals:  Vitals:   06/11/18 0751 06/11/18 0937  BP: (!) 77/60 (!) 162/43  Pulse: 64   Resp: 20   Temp: (!) 97.3 F (36.3 C)   SpO2: 98%     Exam: Constitutional:   . Appears calm and comfortable Respiratory:  . CTA bilaterally, no w/r/r.  . Respiratory effort normal.  Cardiovascular:  . RRR, no m/r/g . 1+ BLE extremity edema; 1-2+ BUE edema Abdomen:  . Soft, ntnd Psychiatric:  . Mental status o Mood, affect appropriate  I have personally reviewed the following:   Data: . I/O o UOP: 2050 o BM: 10/5  CBG: Stable, no lows.  Labs: Creatinine improved, 2.47.  BUN 65, no significant change.  Potassium 5.4.  Lipase up, 1307.  Scheduled Meds: . ALPRAZolam  0.5 mg Oral QHS  . carvedilol  12.5 mg Oral BID WC  . cholecalciferol  5,000 Units Oral Daily  . cloNIDine  0.1 mg Oral TID  . clopidogrel  75 mg Oral Q breakfast  . DULoxetine  60 mg Oral Daily  . famotidine  20 mg Oral QAC supper  . gabapentin  300 mg Oral BID  . hydrALAZINE  100 mg Oral TID  . insulin aspart  0-9 Units Subcutaneous Q4H  . isosorbide mononitrate  15 mg Oral Daily  . lipase/protease/amylase  12,000 Units Oral TID AC  . lipase/protease/amylase)  20,880 Units Per Tube Once   And  . sodium bicarbonate  650 mg Per Tube Once  . mirtazapine  7.5 mg Oral QHS  . mometasone-formoterol  2 puff Inhalation BID  . nicotine  21 mg Transdermal Daily   Continuous Infusions: . sodium chloride    . ferric gluconate (FERRLECIT/NULECIT) IV Stopped (06/10/18 1231)    Principal Problem:    Pancreatitis, recurrent Active Problems:   TOBACCO ABUSE   Essential hypertension   Atrial fibrillation (HCC)   COPD (chronic obstructive pulmonary disease) (HCC)   S/P CABG x 3   Anemia   GERD (gastroesophageal reflux disease)   Hyperlipidemia   CAD (coronary artery disease)   Chronic kidney disease (CKD), stage IV (severe) (HCC)   Chronic diastolic congestive heart failure (HCC)   Type II diabetes mellitus with renal manifestations (HCC)   SIRS (systemic inflammatory response syndrome) (HCC)   Protein-calorie malnutrition, severe   Anasarca   Demand ischemia (HCC)   Hyperkalemia   AKI (acute kidney injury) ([place])   Goals of care, counseling/discussion   Palliative care by specialist   LOS: 14 days

## 2018-06-12 LAB — COMPREHENSIVE METABOLIC PANEL
ALBUMIN: 2.1 g/dL — AB (ref 3.5–5.0)
ALK PHOS: 35 U/L — AB (ref 38–126)
ALT: 9 U/L (ref 0–44)
ANION GAP: 11 (ref 5–15)
AST: 19 U/L (ref 15–41)
BILIRUBIN TOTAL: 1 mg/dL (ref 0.3–1.2)
BUN: 59 mg/dL — AB (ref 8–23)
CALCIUM: 9.1 mg/dL (ref 8.9–10.3)
CO2: 28 mmol/L (ref 22–32)
Chloride: 100 mmol/L (ref 98–111)
Creatinine, Ser: 2.28 mg/dL — ABNORMAL HIGH (ref 0.44–1.00)
GFR calc Af Amer: 25 mL/min — ABNORMAL LOW (ref >60)
GFR calc non Af Amer: 21 mL/min — ABNORMAL LOW (ref >60)
GLUCOSE: 84 mg/dL (ref 70–99)
Potassium: 5.4 mmol/L — ABNORMAL HIGH (ref 3.5–5.1)
SODIUM: 139 mmol/L (ref 135–145)
TOTAL PROTEIN: 5.2 g/dL — AB (ref 6.5–8.1)

## 2018-06-12 LAB — GLUCOSE, CAPILLARY
GLUCOSE-CAPILLARY: 136 mg/dL — AB (ref 70–99)
GLUCOSE-CAPILLARY: 162 mg/dL — AB (ref 70–99)
GLUCOSE-CAPILLARY: 76 mg/dL (ref 70–99)
GLUCOSE-CAPILLARY: 79 mg/dL (ref 70–99)
GLUCOSE-CAPILLARY: 81 mg/dL (ref 70–99)
GLUCOSE-CAPILLARY: 89 mg/dL (ref 70–99)
Glucose-Capillary: 103 mg/dL — ABNORMAL HIGH (ref 70–99)
Glucose-Capillary: 107 mg/dL — ABNORMAL HIGH (ref 70–99)
Glucose-Capillary: 124 mg/dL — ABNORMAL HIGH (ref 70–99)
Glucose-Capillary: 79 mg/dL (ref 70–99)
Glucose-Capillary: 81 mg/dL (ref 70–99)
Glucose-Capillary: 90 mg/dL (ref 70–99)
Glucose-Capillary: 80 mg/dL (ref 70–99)
Glucose-Capillary: 81 mg/dL (ref 70–99)

## 2018-06-12 LAB — CBC
HCT: 27.6 % — ABNORMAL LOW (ref 36.0–46.0)
Hemoglobin: 8.5 g/dL — ABNORMAL LOW (ref 12.0–15.0)
MCH: 31.1 pg (ref 26.0–34.0)
MCHC: 30.8 g/dL (ref 30.0–36.0)
MCV: 101.1 fL — AB (ref 78.0–100.0)
PLATELETS: 352 10*3/uL (ref 150–400)
RBC: 2.73 MIL/uL — ABNORMAL LOW (ref 3.87–5.11)
RDW: 17.9 % — AB (ref 11.5–15.5)
WBC: 13.5 10*3/uL — AB (ref 4.0–10.5)

## 2018-06-12 LAB — LIPASE, BLOOD: Lipase: 140 U/L — ABNORMAL HIGH (ref 11–51)

## 2018-06-12 LAB — PHOSPHORUS: Phosphorus: 6 mg/dL — ABNORMAL HIGH (ref 2.5–4.6)

## 2018-06-12 LAB — MAGNESIUM: Magnesium: 1.9 mg/dL (ref 1.7–2.4)

## 2018-06-12 MED ORDER — FUROSEMIDE 10 MG/ML IJ SOLN
40.0000 mg | Freq: Once | INTRAMUSCULAR | Status: AC
  Administered 2018-06-12: 40 mg via INTRAVENOUS
  Filled 2018-06-12: qty 4

## 2018-06-12 NOTE — Progress Notes (Addendum)
Daily Rounding Note  06/12/2018, 9:48 AM  LOS: 15 days   SUBJECTIVE:   Chief complaint: Nominal pain.  This is still located primarily in the epigastrium and right upper quadrant.  It has improved over the last few days.  There is no nausea or vomiting.  Tube feedings were stopped again over the weekend due to x-ray findings of small bowel ileus versus SBO and rise  In Lipase.  However she had not had any nausea or vomiting or increased pain due to tube feedings associated with the x-ray finding.  Tube now been repositioned and terminates in the ligament of Treitz.  1 soft stool yesterday.  No nausea.  Overall feels better but still requiring IV narcotics for pain management, hydrocodone tablets do not work as well and tend to make her nauseated.       OBJECTIVE:         Vital signs in last 24 hours:    Temp:  [97.6 F (36.4 C)-98.6 F (37 C)] 98.5 F (36.9 C) (10/07 0726) Pulse Rate:  [60-80] 80 (10/07 0726) Resp:  [16-20] 18 (10/07 0726) BP: (159-194)/(44-58) 175/44 (10/07 0726) SpO2:  [92 %-97 %] 93 % (10/07 0906) Last BM Date: 06/11/18 Filed Weights   06/08/18 2026 06/10/18 2012 06/11/18 0500  Weight: 68.4 kg 67.1 kg 67.1 kg   General: Looks better but not well.  Alert.  Comfortable. Heart: RRR. Chest: Diminished breath sounds but clear bilaterally.  No cough or labored breathing. Abdomen: Tender in the right upper and mid abdomen along with the epigastrium.  No guarding or rebound.  Bowel sounds active. Extremities: Anasarca with pitting.  More swelling in the upper extremity than the lower extremity. Neuro/Psych: Alert.  Oriented x3.  Appropriate.  Affect seems more bright than previous.  Intake/Output from previous day: 10/06 0701 - 10/07 0700 In: 1418.9 [I.V.:1418.9] Out: 1400 [Urine:1400]  Intake/Output this shift: No intake/output data recorded.  Lab Results: Recent Labs    06/12/18 0422  WBC 13.5*    HGB 8.5*  HCT 27.6*  PLT 352   BMET Recent Labs    06/10/18 0628 06/11/18 0444 06/12/18 0422  NA 138 137 139  K 5.1 5.4* 5.4*  CL 96* 96* 100  CO2 29 30 28   GLUCOSE 123* 155* 84  BUN 62* 65* 59*  CREATININE 2.63* 2.47* 2.28*  CALCIUM 9.3 8.8* 9.1   LFT Recent Labs    06/10/18 0628 06/12/18 0422  PROT  --  5.2*  ALBUMIN 2.0* 2.1*  AST  --  19  ALT  --  9  ALKPHOS  --  35*  BILITOT  --  1.0     ASSESMENT:   *  Complicated, unresolved acute biliary vs recurrent, necrotizing, pancreatitis.   Cholecystectomy 04/19/18. Latest CT 9/22: Progressive, severe, necrotizing pancrreatitis with associated inflammation in duodenum, hepatic flexure of colon.  Evolving pseudocysts.  Small peri-hepatic ascites. Lipasse 677 >> 140.  Normal LFTs.    *  Severe protein cal malnutrition.  Gastric tube feedings (unable to get tube to PP position) stopped last week x 2 days and again on 10/5 due to abd pain, ? Distal SBO vs ileus on KUB x 2 of 10/4.  Feeding tube repositioned and low in region on Lig of Treitz.     Dr Donne Hazel ok with restarting TF today.     *   CKD.  AKI, oliguria improved.  This limits imaging to non-contrast studies.  Nephrology following  *    Hx CHF.  Volume positive several liters since admission.  Improved in recent days.  Wt. 56.6 Kg >> 69.4 Kg >> 67.1 kg over admission.      *    Hyperkalemia.    *   Macrocytic anemia.  Hgb stable.  Nega EGD in 04/2018 for FOBT + anemia.  Transfused 9/26.  Ferric gluconate infusion on 10/4.     PLAN   *  ?   Start low rate tube feeds?  Will discuss with Dr. Tarri Glenn.  *   Stopped orders for daily Lipase.      Azucena Freed  06/12/2018, 9:48 AM Phone [phone]

## 2018-06-12 NOTE — Progress Notes (Signed)
[facility] Progress Note   Subjective:     Afebrile, 2L [place], BPs stable  1.4L UOP yesterday, net even 24h  Lipase 140 this AM  SCr stable 2.28, K 4.5, HCO3 28  Objective Vitals:   06/11/18 2009 06/12/18 0407 06/12/18 0726 06/12/18 0906  BP: (!) 159/44 (!) 169/58 (!) 175/44   Pulse: 60 64 80   Resp: 16 19 18    Temp: 97.6 F (36.4 C) 98.6 F (37 C) 98.5 F (36.9 C)   TempSrc: Oral  Oral   SpO2: 97% 95% 92% 93%  Weight:      Height:       Physical Exam General: chronically ill appearing but comfortable at 45 degrees in bed Heart: no rub, RRR Lungs: normal WOB, dec BS bases Abdomen: soft, mildly TTP epigastrium Extremities:  Anasarca 4 limbs, 2+ pitting; LUE AVF +t/b  Additional Objective Labs: Basic Metabolic Panel: Recent Labs  Lab 06/06/18 0551  06/10/18 0628 06/11/18 0444 06/12/18 0422  NA 135   < > 138 137 139  K 5.2*   < > 5.1 5.4* 5.4*  CL 94*   < > 96* 96* 100  CO2 32   < > 29 30 28   GLUCOSE 133*   < > 123* 155* 84  BUN 55*   < > 62* 65* 59*  CREATININE 2.23*   < > 2.63* 2.47* 2.28*  CALCIUM 8.1*   < > 9.3 8.8* 9.1  PHOS 6.3*  --  6.5*  --  6.0*   < > = values in this interval not displayed.   Liver Function Tests: Recent Labs  Lab 06/07/18 0452 06/08/18 0648 06/10/18 0628 06/12/18 0422  AST 19 23  --  19  ALT 10 9  --  9  ALKPHOS 39 32*  --  35*  BILITOT 0.6 0.8  --  1.0  PROT 5.1* 5.3*  --  5.2*  ALBUMIN 2.0* 2.1* 2.0* 2.1*   Recent Labs  Lab 06/10/18 0628 06/11/18 0444 06/12/18 0422  LIPASE 205* 1,307* 140*   CBC: Recent Labs  Lab 06/06/18 0551 06/07/18 0452 06/09/18 0708 06/12/18 0422  WBC 13.6* 13.7* 13.0* 13.5*  HGB 7.6* 8.2* 8.3* 8.5*  HCT 24.5* 25.7* 26.6* 27.6*  MCV 98.4 98.5 100.8* 101.1*  PLT 269 339 357 352   Blood Culture    Component Value Date/Time   SDES BLOOD RIGHT HAND 06/05/2018 1453   SPECREQUEST  06/05/2018 1453    BOTTLES DRAWN AEROBIC ONLY Blood Culture adequate volume   CULT   06/05/2018 1453    NO GROWTH 5 DAYS Performed at [facility], [place] [address], [place], [place] [address]    REPTSTATUS 06/10/2018 FINAL 06/05/2018 1453    Cardiac Enzymes: No results for input(s): CKTOTAL, CKMB, CKMBINDEX, TROPONINI in the last 168 hours. CBG: Recent Labs  Lab 06/11/18 1725 06/11/18 2010 06/12/18 0025 06/12/18 0409 06/12/18 0727  GLUCAP 89 81 79 79 90   Iron Studies: No results for input(s): IRON, TIBC, TRANSFERRIN, FERRITIN in the last 72 hours. @lablastinr3 @ Studies/Results: No results found. Medications: . sodium chloride 75 mL/hr at 06/12/18 0600  . ferric gluconate (FERRLECIT/NULECIT) IV 110 mL/hr at 06/11/18 1300   . ALPRAZolam  0.5 mg Oral QHS  . carvedilol  12.5 mg Oral BID WC  . cholecalciferol  5,000 Units Oral Daily  . cloNIDine  0.1 mg Oral TID  . clopidogrel  75 mg Oral Q breakfast  . DULoxetine  60 mg Oral Daily  .  famotidine  20 mg Oral QAC supper  . gabapentin  300 mg Oral BID  . hydrALAZINE  100 mg Oral TID  . insulin aspart  0-9 Units Subcutaneous Q4H  . isosorbide mononitrate  15 mg Oral Daily  . lipase/protease/amylase  12,000 Units Oral TID AC  . lipase/protease/amylase)  20,880 Units Per Tube Once   And  . sodium bicarbonate  650 mg Per Tube Once  . mirtazapine  7.5 mg Oral QHS  . mometasone-formoterol  2 puff Inhalation BID  . nicotine  21 mg Transdermal Daily    Assessment/Plan: 1.  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  Followed by Posey Pronto at [facility]  Renal function stable over past 24h, plan to restart enteral nutrition today will redose 40mg  IV lasix.    2.  HTN: Generally in the 130-150s recently which is acceptably controlled currently   3.  Hyperkalemia: modest, 5.4 today. CTM.    4.  Anemia:  Hb stable in 8s.  Rec ESA 10/4. CTM.   Pearson Grippe MD 06/12/2018, 9:36 AM  [facility]

## 2018-06-12 NOTE — Progress Notes (Signed)
   Subjective/Chief Complaint: Feels ok, some pain, no n/v   Objective: Vital signs in last 24 hours: Temp:  [97.6 F (36.4 C)-98.6 F (37 C)] 98.5 F (36.9 C) (10/07 0726) Pulse Rate:  [60-80] 80 (10/07 0726) Resp:  [16-20] 18 (10/07 0726) BP: (159-194)/(44-58) 175/44 (10/07 0726) SpO2:  [92 %-97 %] 93 % (10/07 0906) Last BM Date: 06/11/18  Intake/Output from previous day: 10/06 0701 - 10/07 0700 In: 1418.9 [I.V.:1418.9] Out: 1400 [Urine:1400] Intake/Output this shift: No intake/output data recorded.  GI: mildly tender upper abdomen, soft, some bs  Lab Results:  Recent Labs    06/12/18 0422  WBC 13.5*  HGB 8.5*  HCT 27.6*  PLT 352   BMET Recent Labs    06/11/18 0444 06/12/18 0422  NA 137 139  K 5.4* 5.4*  CL 96* 100  CO2 30 28  GLUCOSE 155* 84  BUN 65* 59*  CREATININE 2.47* 2.28*  CALCIUM 8.8* 9.1   PT/INR No results for input(s): LABPROT, INR in the last 72 hours. ABG No results for input(s): PHART, HCO3 in the last 72 hours.  Invalid input(s): PCO2, PO2  Studies/Results: No results found.  Anti-infectives: Anti-infectives (From admission, onward)   Start     Dose/Rate Route Frequency Ordered Stop   06/05/18 1445  meropenem (MERREM) 1 g in sodium chloride 0.9 % 100 mL IVPB     1 g 200 mL/hr over 30 Minutes Intravenous Every 12 hours 06/05/18 1433 06/10/18 1019   05/29/18 0600  piperacillin-tazobactam (ZOSYN) IVPB 3.375 g     3.375 g 12.5 mL/hr over 240 Minutes Intravenous Every 8 hours 05/28/18 2227 06/05/18 1600   05/28/18 2230  piperacillin-tazobactam (ZOSYN) IVPB 3.375 g     3.375 g 100 mL/hr over 30 Minutes Intravenous  Once 05/28/18 2227 05/29/18 0011      Assessment/Plan: Necrotizing pancreatitis, pseudocyst -can restart tube feeds from my standpoint -will give ice chips -no need for ct right now -no indication for surgery  AKI -Cr improving  Rolm Bookbinder 06/12/2018

## 2018-06-12 NOTE — Progress Notes (Addendum)
PROGRESS NOTE  JUDE LINCK RWE:[id number] DOB: 1950/11/26 DOA: 05/28/2018 PCP: Bernerd Limbo, MD  Brief Narrative: 67 year old woman PMH gallstone pancreatitis August 2019, status post laparoscopic cholecystectomy same hospitalization, presented with abdominal pain, nausea, vomiting.  Admitted for recurrent pancreatitis.  She was seen by gastroenterology, cortrak tube was placed and she was started on enteral feeds.  Assessment/Plan Acute pancreatitis, recurrent versus smoldering, severe inflammatory changes noted, new complex fluid collection on CT concerning for pseudocyst, necrosis, SIRS on admission.  History of gallstone pancreatitis August 2019 at which time patient underwent laparoscopic cholecystectomy.  Autoimmune pancreatitis ruled out.  Treated for 2 weeks with antibiotics, completed 10/5.  No evidence of infection. --Subjectively improved.  Lipase significantly decreased.  Unclear significance of yesterday's elevated value. --Agree with GI and surgery, restart tube feeds today and monitor.  Check BMP and lipase in a.m. --We will defer diet to GI  Mild ileus, appreciate general surgery evaluation and ongoing recommendations. --This appears clinically resolved.  AKI on CKD stage IV with modest hyperkalemia, stable --BUN and creatinine somewhat better again today.  Modest hyperkalemia stable.  Continue management per nephrology.  Anasarca secondary to hypoalbuminemia, aggressive volume resuscitation, transfusion, underlying chronic diastolic CHF on Lasix as an outpatient, with held on admission. +14.3 L since admission. --Leg edema resolving.  Still has significant edema bilateral upper extremities.  Continue Lasix per nephrology.  Chronic diastolic CHF managed with Lasix as an outpatient --Remains stable.  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  Colonoscopy planned October 28.  Was not iron deficient August. --Remains stable status post transfusion.      Atrial fibrillation.  CHA2DS2-VASc 6. Not on anticoagulation.  CAD, PMH CABG. --Stable.  COPD, chronic hypoxic respiratory failure on oxygen at home --Stable.  Demand ischemia.  PMH CAD, CABG. --Cardiology recommended medical management, consider outpatient stress test.  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  Last A1c 5.05 Jan 2018. --CBG remains stable. Continue SSI  Severe malnutrition, FTT --Restart tube feeds  Aortic atherosclerosis --No inpatient evaluation indicated.   Overall somewhat better.  DVT prophylaxis: SCDs Code Status: full Family Communication: Discussed with daughter [name] by telephone last evening Disposition Plan: pending, patient and family want to go home   Murray Hodgkins, MD  [facility] Direct contact: [phone] --[place]  --[email or web address]; password [email or web address]  7PM-7AM contact night coverage as above 06/12/2018, 11:51 AM  LOS: 15 days   Consultants:  GI  Cardiology  Procedures:  cortrak feeding tube placement on 9/23  Transfusion 1 unit PRBC  Echo Study Conclusions  - Left ventricle: The cavity size was mildly dilated. There was   mild focal basal hypertrophy of the septum. Systolic function was   normal. The estimated ejection fraction was in the range of 60%   to 65%. Wall motion was normal; there were no regional wall   motion abnormalities. Features are consistent with a pseudonormal   left ventricular filling pattern, with concomitant abnormal   relaxation and increased filling pressure (grade 2 diastolic   dysfunction). Doppler parameters are consistent with high   ventricular filling pressure. - Mitral valve: S/P mitral valve repair with moderate mitral   stenosis. There was mild regurgitation. Mean gradient (D): 9 mm   Hg. Valve area by pressure half-time: 2.24 cm^2. Valve area by   continuity equation (using LVOT flow): 1.17 cm^2. - Left atrium: The atrium was mildly  dilated. - Pulmonary arteries: PA peak pressure: 36 mm Hg (S).  Impressions:  - Normal LVF with mild BSH, mild MR and TR, mild LVE, grade 3   diastolic dysfunction.. S/P mitral valve repair with moderate MS.   No significant change from prior echo The right ventricular   systolic pressure was increased consistent with mild pulmonary   hypertension.  Antimicrobials:  Merrem  Interval history/Subjective: Feels better, pain controlled, would like to drink some liquids. No n/v. Breathing ok. Pain in both arms from swelling.  Objective: Vitals:  Vitals:   06/12/18 0726 06/12/18 0906  BP: (!) 175/44   Pulse: 80   Resp: 18   Temp: 98.5 F (36.9 C)   SpO2: 92% 93%    Exam: Constitutional:   . Appears calm and comfortable Respiratory:  . CTA bilaterally, no w/r/r.  . Respiratory effort normal. Cardiovascular:  . RRR, no m/r/g . No LE extremity edema. 2+ BUE edema. Abdomen:  . Soft, ntnd Skin:  . Ecchymosis and swelling noted BUE Psychiatric:  . Mental status o Mood, affect appropriate  I have personally reviewed the following:   Data: . I/O o UOP: 1400 o BM: 10/6  CBG: Stable, no lows.  Labs: Creatinine improved, 2.28.  BUN trending down, 59.  Potassium stable 5.4.  Magnesium within normal limits.  Lipase dramatically decreased, 140.  Remainder LFTs unremarkable.  WBC stable at 13.5.  Hemoglobin stable 8.5.  Platelets 352.  Scheduled Meds: . ALPRAZolam  0.5 mg Oral QHS  . carvedilol  12.5 mg Oral BID WC  . cholecalciferol  5,000 Units Oral Daily  . cloNIDine  0.1 mg Oral TID  . clopidogrel  75 mg Oral Q breakfast  . DULoxetine  60 mg Oral Daily  . famotidine  20 mg Oral QAC supper  . furosemide  40 mg Intravenous Once  . gabapentin  300 mg Oral BID  . hydrALAZINE  100 mg Oral TID  . insulin aspart  0-9 Units Subcutaneous Q4H  . isosorbide mononitrate  15 mg Oral Daily  . lipase/protease/amylase  12,000 Units Oral TID AC  . lipase/protease/amylase)   20,880 Units Per Tube Once   And  . sodium bicarbonate  650 mg Per Tube Once  . mirtazapine  7.5 mg Oral QHS  . mometasone-formoterol  2 puff Inhalation BID  . nicotine  21 mg Transdermal Daily   Continuous Infusions: . sodium chloride 75 mL/hr at 06/12/18 0600  . ferric gluconate (FERRLECIT/NULECIT) IV 110 mL/hr at 06/11/18 1300    Principal Problem:   Pancreatitis, recurrent Active Problems:   TOBACCO ABUSE   Essential hypertension   Atrial fibrillation (HCC)   COPD (chronic obstructive pulmonary disease) (HCC)   S/P CABG x 3   Anemia   GERD (gastroesophageal reflux disease)   Hyperlipidemia   CAD (coronary artery disease)   Chronic kidney disease (CKD), stage IV (severe) (HCC)   Chronic diastolic congestive heart failure (HCC)   Type II diabetes mellitus with renal manifestations (HCC)   SIRS (systemic inflammatory response syndrome) (HCC)   Protein-calorie malnutrition, severe   Anasarca   Demand ischemia (HCC)   Hyperkalemia   AKI (acute kidney injury) ([place])   Goals of care, counseling/discussion   Palliative care by specialist   LOS: 15 days

## 2018-06-12 NOTE — Progress Notes (Signed)
Physical Therapy Treatment Patient Details Name: [name] MRN: [id number] DOB: [birth year] Today's Date: 06/12/2018    History of Present Illness Pt is a 67 y/o female with c/o of abdominal pain. Note recent hospitalization 04/09/18 to 04/21/18 with GI bleed, gallstone pancreatitis, and during which she received laparoscopic cholecystectomy. Diagnosed with recurrent pancreatitis. PMH AAA, CHF, chronic back pain, CKD, COPD, GI bleed, HTN, MI, A-fib, DM, cardiac cath, CABG, maze procedure, mitral valve repair, R femoral-popliteal bypass     PT Comments    Continuing work on functional mobility and activity tolerance;  Noting significant improvements in functional mobility and activity tolerance;  Per discussion with RN, family does not want pt to go to SNF, and given excellent gains in mobility, this is very reasonable; Would like for [name] to have 24 hour assist at home  Follow Up Recommendations  Home health PT;Supervision/Assistance - 24 hour(family pulling together a plan for 24 hour assist)     Equipment Recommendations  None recommended by PT    Recommendations for Other Services       Precautions / Restrictions Precautions Precautions: Fall;Other (comment) Precaution Comments: watch SPO2/HR; Cortrak    Mobility  Bed Mobility Overal bed mobility: Needs Assistance Bed Mobility: Supine to Sit     Supine to sit: Min assist     General bed mobility comments: min handheld assist to pull to sit  Transfers Overall transfer level: Needs assistance Equipment used: Rolling walker (2 wheeled) Transfers: Sit to/from Stand Sit to Stand: Min assist         General transfer comment: MinA to come to full standing position, min guard once upright with RW   Ambulation/Gait Ambulation/Gait assistance: Min guard Gait Distance (Feet): 80 Feet Assistive device: Rolling walker (2 wheeled) Gait Pattern/deviations: Step-through pattern;Decreased step length - right;Decreased step  length - left;Decreased stride length Gait velocity: slowed   General Gait Details: Much more stable knees in stance today; Able to progress distance well   Stairs             Wheelchair Mobility    Modified Rankin (Stroke Patients Only)       Balance     Sitting balance-Leahy Scale: Fair       Standing balance-Leahy Scale: Fair                              Cognition Arousal/Alertness: Awake/alert Behavior During Therapy: WFL for tasks assessed/performed Overall Cognitive Status: Within Functional Limits for tasks assessed                                        Exercises      General Comments General comments (skin integrity, edema, etc.): Opted to walk with supplemental O2 as she reported feeling fatigued      Pertinent Vitals/Pain Pain Assessment: No/denies pain    Home Living                      Prior Function            PT Goals (current goals can now be found in the care plan section) Acute Rehab PT Goals Patient Stated Goal: Hopes to be able to go home PT Goal Formulation: With patient Time For Goal Achievement: 06/20/18 Potential to Achieve Goals: Good Progress towards PT goals: Progressing toward goals  Frequency    Min 3X/week      PT Plan Discharge plan needs to be updated;Other (comment)(Per RN, family declining SNF, and pt improving)    Co-evaluation              AM-PAC PT "6 Clicks" Daily Activity  Outcome Measure  Difficulty turning over in bed (including adjusting bedclothes, sheets and blankets)?: A Little Difficulty moving from lying on back to sitting on the side of the bed? : A Little Difficulty sitting down on and standing up from a chair with arms (e.g., wheelchair, bedside commode, etc,.)?: A Lot Help needed moving to and from a bed to chair (including a wheelchair)?: A Little Help needed walking in hospital room?: A Little Help needed climbing 3-5 steps with a  railing? : A Lot 6 Click Score: 16    End of Session Equipment Utilized During Treatment: Gait belt;Oxygen Activity Tolerance: Patient tolerated treatment well Patient left: in chair;with call bell/phone within reach;with chair alarm set Nurse Communication: Mobility status PT Visit Diagnosis: Muscle weakness (generalized) (M62.81)     Time: 4128-7867 PT Time Calculation (min) (ACUTE ONLY): 20 min  Charges:  $Gait Training: 8-22 mins                     [name], [place] Pager [phone] Office [place] 06/12/2018, 1:08 PM

## 2018-06-13 ENCOUNTER — Encounter (HOSPITAL_COMMUNITY): Payer: Self-pay

## 2018-06-13 LAB — BASIC METABOLIC PANEL
Anion gap: 13 (ref 5–15)
BUN: 57 mg/dL — AB (ref 8–23)
CALCIUM: 9 mg/dL (ref 8.9–10.3)
CO2: 24 mmol/L (ref 22–32)
CREATININE: 2.32 mg/dL — AB (ref 0.44–1.00)
Chloride: 103 mmol/L (ref 98–111)
GFR calc non Af Amer: 21 mL/min — ABNORMAL LOW (ref >60)
GFR, EST AFRICAN AMERICAN: 24 mL/min — AB (ref >60)
Glucose, Bld: 82 mg/dL (ref 70–99)
Potassium: 5.4 mmol/L — ABNORMAL HIGH (ref 3.5–5.1)
Sodium: 140 mmol/L (ref 135–145)

## 2018-06-13 LAB — MAGNESIUM: Magnesium: 2.1 mg/dL (ref 1.7–2.4)

## 2018-06-13 LAB — GLUCOSE, CAPILLARY
GLUCOSE-CAPILLARY: 175 mg/dL — AB (ref 70–99)
GLUCOSE-CAPILLARY: 78 mg/dL (ref 70–99)
GLUCOSE-CAPILLARY: 91 mg/dL (ref 70–99)
GLUCOSE-CAPILLARY: 99 mg/dL (ref 70–99)
Glucose-Capillary: 77 mg/dL (ref 70–99)
Glucose-Capillary: 152 mg/dL — ABNORMAL HIGH (ref 70–99)

## 2018-06-13 LAB — PHOSPHORUS: PHOSPHORUS: 5.6 mg/dL — AB (ref 2.5–4.6)

## 2018-06-13 MED ORDER — HEPARIN SODIUM (PORCINE) 5000 UNIT/ML IJ SOLN
5000.0000 [IU] | Freq: Three times a day (TID) | INTRAMUSCULAR | Status: DC
  Administered 2018-06-13 – 2018-06-15 (×6): 5000 [IU] via SUBCUTANEOUS
  Filled 2018-06-13 (×4): qty 1

## 2018-06-13 MED ORDER — VITAL AF 1.2 CAL PO LIQD
1000.0000 mL | ORAL | Status: DC
  Administered 2018-06-13: 1000 mL
  Filled 2018-06-13 (×2): qty 1500
  Filled 2018-06-13: qty 1000

## 2018-06-13 MED ORDER — FUROSEMIDE 10 MG/ML IJ SOLN
40.0000 mg | Freq: Every day | INTRAMUSCULAR | Status: DC
  Administered 2018-06-13 – 2018-06-14 (×2): 40 mg via INTRAVENOUS
  Filled 2018-06-13 (×2): qty 4

## 2018-06-13 NOTE — Progress Notes (Signed)
Nutrition Follow-up  DOCUMENTATION CODES:   Severe malnutrition in context of chronic illness  INTERVENTION:   Tube Feeding:  Initiate Vital AF 1.2 @ 20 ml/hr Titrate by 10 mL q 12 hours until goal rate of 60 ml/hr Provides 1728 kcals, 108 g of protein and 1166 mL of free water  Diet advancement as tolerated  NUTRITION DIAGNOSIS:   Severe Malnutrition related to chronic illness(CHF, AAA s/p graft repair, pancreatitis) as evidenced by severe muscle depletion, moderate muscle depletion, mild fat depletion, moderate fat depletion, energy intake < or equal to 75% for > or equal to 1 month.  Being addressed via nutrition support  GOAL:   Patient will meet greater than or equal to 90% of their needs  Progressing  MONITOR:   Diet advancement, Labs, TF tolerance, I & O's, Weight trends  REASON FOR ASSESSMENT:   Consult Enteral/tube feeding initiation and management  ASSESSMENT:   Patient with PMH significant for HTN, HLD, DM, COPD, GERD, gout, depression with anxiety, tobacco abuse, AAA s/p stent graft repair 01/2018, CAD, CABG, CHF, CKD IV s/p AV fistula for anticipated transition to HD, GI bleeding, mitral valve regurgitation, PAD, and s/p lap chole 04/19/18. Presents this admission with abdominal pain. Admitted for recurrent vs never resolved acute pancreatitis and GIB.   Cortrak tube tip beyond LOT TF held on 10/06, plan to resume today Per MD note, pt did not report change in symptoms with holding of TF. Discussed with pt on visit today and pt agrees that her pain is not associated with initiation of feedings. Plan to not follow lipase at present per MD notes  Pt reports pain this AM but complains mostly of pain her left arm. B/L UE are swollen  CL diet ordered this AM, pt thirsty,requesting some ginger ale. RD provided this to pt  Pt reports she did not have a BM yesterday, no BM yet today. +1BM the day before  Follow electrolytes; potassium 5.4, phosphorus 5.6. In  setting of severe acute pancreatitis, do NOT recommend switching to Nepro formula as this formula is >50% fat and NOT elemental and therefore not likely to be well tolerated by this patient  Weight relatively stable over the past week.   Pt continues to feel weak but ambulated down the hallway with PT yesterday and then sat up in chair.   Labs: potassium 5.4, BUN 57, Creatinine 2.32, phosphorus 5.6, CBGs wdl, lipase 140 Meds: NS at 75 ml/hr, cholecalciferol, ferric gluconate, Creon, imodium, remeron  Diet Order:   Diet Order            Diet clear liquid Room service appropriate? Yes; Fluid consistency: Thin  Diet effective now              EDUCATION NEEDS:   Not appropriate for education at this time  Skin:  Skin Assessment: Reviewed RN Assessment  Last BM:  10/6  Height:   Ht Readings from Last 1 Encounters:  05/30/18 5\' 5"  (1.651 m)    Weight:   Wt Readings from Last 1 Encounters:  06/12/18 67.2 kg    Ideal Body Weight:  56.8 kg  BMI:  Body mass index is 24.65 kg/m.  Estimated Nutritional Needs:   Kcal:  1700-1900 kcal  Protein:  85-100 g  Fluid:  >/= 1.7 L/day   Kerman Passey MS, RD, LDN, CNSC [phone] Pager  [phone] Weekend/On-Call Pager

## 2018-06-13 NOTE — Progress Notes (Addendum)
[facility] Progress Note      Assessment/Plan AKI on CKD Anasarca  malnutrition Chronic diastolic HF Acute on chronic anemia  A.fib COPD on home O2 Diarrhea  DM2  Atherosclerosis   Ileus, likely secondary to severe necrotizing pancreatitis with pseudocyst, history of gallstone pancreatitis, s/p lap chole on 04-19-18 - afebrile, VSS - post-pyloric PANDA placed 10/4, tolerating TFs - lipase trending down  FEN: TF @ goal 60 cc/hr, CLD okay ID: Zosyn 9/22-9/30, Merrem 9/30-10/5 VTE: SCD's Follow up: TBD  Plan:  no need for ct right now, no indication for surgery, we will follow   LOS: 16 days    Subjective: CC: pancreatitis  Abdominal pain improving. No nausea, vomiting, fever or chills. No BM today. No flatus that she is aware of.  Objective: Vital signs in last 24 hours: Temp:  [97.6 F (36.4 C)-98 F (36.7 C)] 97.9 F (36.6 C) (10/08 0750) Pulse Rate:  [63-83] 83 (10/08 0750) Resp:  [18] 18 (10/08 0750) BP: (152-174)/(41-73) 152/61 (10/08 0750) SpO2:  [94 %-97 %] 95 % (10/08 0847) Weight:  [67.2 kg] 67.2 kg (10/07 2041) Last BM Date: 06/11/18  Intake/Output from previous day: 10/07 0701 - 10/08 0700 In: 1608.3 [I.V.:1608.3] Out: 2750 [Urine:2750] Intake/Output this shift: Total I/O In: 0  Out: 350 [Urine:350]  PE: Gen:  Alert, NAD, pleasant, cooperative Pulm:  Rate and effort normal Abd: Soft, NT/ND, +BS, no HSM Skin: warm and dry   Anti-infectives: Anti-infectives (From admission, onward)   Start     Dose/Rate Route Frequency Ordered Stop   06/05/18 1445  meropenem (MERREM) 1 g in sodium chloride 0.9 % 100 mL IVPB     1 g 200 mL/hr over 30 Minutes Intravenous Every 12 hours 06/05/18 1433 06/10/18 1019   05/29/18 0600  piperacillin-tazobactam (ZOSYN) IVPB 3.375 g     3.375 g 12.5 mL/hr over 240 Minutes Intravenous Every 8 hours 05/28/18 2227 06/05/18 1600   05/28/18 2230  piperacillin-tazobactam (ZOSYN) IVPB 3.375 g     3.375 g 100 mL/hr over 30 Minutes Intravenous  Once 05/28/18 2227 05/29/18 0011      Lab Results:  Recent Labs    06/12/18 0422  WBC 13.5*  HGB 8.5*  HCT 27.6*  PLT 352   BMET Recent Labs    06/12/18 0422 06/13/18 0526  NA 139 140  K 5.4* 5.4*  CL 100 103  CO2 28 24  GLUCOSE 84 82  BUN 59* 57*  CREATININE 2.28* 2.32*  CALCIUM 9.1 9.0   PT/INR No results for input(s): LABPROT, INR in the last 72 hours. CMP     Component Value Date/Time   NA 140 06/13/2018 0526   K 5.4 (H) 06/13/2018 0526   CL 103 06/13/2018 0526   CO2 24 06/13/2018 0526   GLUCOSE 82 06/13/2018 0526   BUN 57 (H) 06/13/2018 0526   CREATININE 2.32 (H) 06/13/2018 0526   CREATININE 1.32 (H) 06/17/2015 1257   CALCIUM 9.0 06/13/2018 0526   PROT 5.2 (L) 06/12/2018 0422   ALBUMIN 2.1 (L) 06/12/2018 0422   AST 19 06/12/2018 0422   ALT 9 06/12/2018 0422   ALKPHOS 35 (L) 06/12/2018 0422   BILITOT 1.0 06/12/2018 0422   GFRNONAA 21 (L) 06/13/2018 0526   GFRAA 24 (L) 06/13/2018 0526   Lipase     Component Value Date/Time   LIPASE 140 (H) 06/12/2018 0422    Studies/Results: No results found.    Kalman Drape , [facility] 06/13/2018,  10:18 AM  Pager: [phone] Mon-Wed, Friday 7:00am-4:30pm Thurs 7am-11:30am  Consults: [phone]

## 2018-06-13 NOTE — Care Management Note (Addendum)
Case Management Note  Patient Details  Name: [name] MRN: [id number] Date of Birth: 16-Jun-1951  Subjective/Objective: Pt presented for gallstones- pancreatitis- abdominal pain nausea and vomiting. Cortrak in place with tube feeds. PTA from home alone in an apartment. CM was able to speak with daughter [name]. [name] has refused SNF at this time. CM asked daughter more than once in regards to plan of care- daughter refused SNF stating that patient would not get the care that she needed. Plan will be for home with [place]. Pt will need orders for [facility] RN, PT, OT, Aide CSW  And F2F. Pt will ned DME Wheelchair. Pt has 3n1, RW and oxygen in the home. Per daughter [name] pt will go to her other daughters home  @ [place].               Action/Plan: CM called Referral to [facility]- for [facility] Services. SOC to begin within 24-48 hours post transition home. Awaiting PT notes referral not made for Wheelchair at this time. Pt will not need Hospital bed. Tube Feeds @ night- Unsure if will need for home. If so, Daughter will need education- Staff to teach family in regards to tube feedings. [facility] will supply tube feedings/ Pump will need orders if this is plan for home. CSW with [facility] will set the patient up with Palliative Services in the home ([facility]). CM did discuss transportation with daughter- will assess again as it gets closer to transition home to see if patient needs ambulance vs private vehicle. Patient sitting in recliner during time of visit. Will continue to monitor.   Expected Discharge Date:                  Expected Discharge Plan:  [place]  In-House Referral:  Clinical Social Work  Discharge planning Services  CM Consult  Post Acute Care Choice:  Durable Medical Equipment, Home Health Choice offered to:  Patient, Adult Children  DME Arranged:  Tube feeding, Tube feeding pump DME Agency:  [place]:  RN, PT, OT, Nurse's  Aide, Social Work, Disease Management, Refused SNF(Daughter refusing SNF at this time. ) [place]:  [place]  Status of Service:  Completed, signed off  If discussed at [employer], dates discussed:    Additional Comments:  Bethena Roys, RN 06/13/2018, 3:15 PM

## 2018-06-13 NOTE — Progress Notes (Signed)
Physical Therapy Note  Patient suffers from recurrent pancreatitis which impairs their ability to perform daily activities like walking, mobilizing,  in the home.  A walker alone will not resolve the issues with performing activities of daily living. A wheelchair will allow patient to safely perform daily activities, including getting to doctor's appointments.  The patient can self propel in the home or has a caregiver who can provide assistance.     [name], [place]  Acute Rehabilitation Services Pager [phone] Office [phone]

## 2018-06-13 NOTE — Progress Notes (Signed)
PROGRESS NOTE  [name] HCW:[id number] DOB: Jun 20, 1951 DOA: 05/28/2018 PCP: Bernerd Limbo, MD  Brief Narrative: 67 year old woman PMH gallstone pancreatitis August 2019, status post laparoscopic cholecystectomy same hospitalization, presented with abdominal pain, nausea, vomiting.  Admitted for recurrent pancreatitis.  She was seen by gastroenterology, cortrak tube was placed and she was started on enteral feeds.  Continuation has been very slow to improve and was complicated by a mild ileus, acute kidney injury and anasarca.  Plan at this point is to continue diuresis as tolerated, advance diet as per GI and general surgery, likely home, possibly with tube feeds, towards the end of the week.  Assessment/Plan Acute pancreatitis, recurrent versus smoldering, severe inflammatory changes noted, new complex fluid collection on CT concerning for pseudocyst, necrosis, SIRS on admission.  History of gallstone pancreatitis August 2019 at which time patient underwent laparoscopic cholecystectomy.  Autoimmune pancreatitis ruled out.  Treated for 2 weeks with antibiotics, completed 10/5.  No evidence of infection. --Continues to improve clinically.  Hold off on checking further lipase levels unless there is a change in her condition.   --Continue tube feeds.  Started on clears.   --Appreciate GI and general surgery.   Mild ileus, appreciate general surgery evaluation and ongoing recommendations. --Clinically resolved.  AKI on CKD stage IV with modest hyperkalemia, stable --BUN and creatinine stable.  Continue Lasix per nephrology.  Anasarca secondary to hypoalbuminemia, aggressive volume resuscitation, transfusion, underlying chronic diastolic CHF on Lasix as an outpatient, with held on admission. +14.3 L since admission. --Leg edema stable.  Upper extremity edema slow to clear.  9/30 patient had bilateral upper extremity venous duplex which was negative.  Chronic diastolic CHF managed with Lasix as  an outpatient --Stable.  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  Colonoscopy planned October 28.  Was not iron deficient August. --Stable status post transfusion.    Atrial fibrillation.  CHA2DS2-VASc 6. Not on anticoagulation.  CAD, PMH CABG. --Stable.  COPD, chronic hypoxic respiratory failure on oxygen at home --Remains stable.  Demand ischemia.  PMH CAD, CABG. --Cardiology recommended medical management, consider outpatient stress test.  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  Last A1c 5.05 Jan 2018. --CBG remains stable.  Continue sliding scale insulin.  Severe malnutrition, FTT --Continue tube feeds  Aortic atherosclerosis --No inpatient evaluation indicated.   DVT prophylaxis: SCDs Code Status: full Family Communication: Disposition Plan: Likely home   Murray Hodgkins, MD  [facility] Direct contact: [phone] --Via [place]  --[email or web address]; password [email or web address]  7PM-7AM contact night coverage as above 06/13/2018, 2:37 PM  LOS: 16 days   Consultants:  GI  Cardiology  Procedures:  cortrak feeding tube placement on 9/23  Transfusion 1 unit PRBC  Echo Study Conclusions  - Left ventricle: The cavity size was mildly dilated. There was   mild focal basal hypertrophy of the septum. Systolic function was   normal. The estimated ejection fraction was in the range of 60%   to 65%. Wall motion was normal; there were no regional wall   motion abnormalities. Features are consistent with a pseudonormal   left ventricular filling pattern, with concomitant abnormal   relaxation and increased filling pressure (grade 2 diastolic   dysfunction). Doppler parameters are consistent with high   ventricular filling pressure. - Mitral valve: S/P mitral valve repair with moderate mitral   stenosis. There was mild regurgitation. Mean gradient (D): 9 mm   Hg. Valve area by pressure half-time: 2.24 cm^2.  Valve area by   continuity equation (using LVOT flow): 1.17 cm^2. - Left atrium: The atrium was mildly dilated. - Pulmonary arteries: PA peak pressure: 36 mm Hg (S).  Impressions:  - Normal LVF with mild BSH, mild MR and TR, mild LVE, grade 3   diastolic dysfunction.. S/P mitral valve repair with moderate MS.   No significant change from prior echo The right ventricular   systolic pressure was increased consistent with mild pulmonary   hypertension.  Antimicrobials:  Merrem  Interval history/Subjective: Feels better, breathing fine, no abd pain.   Objective: Vitals:  Vitals:   06/13/18 0750 06/13/18 0847  BP: (!) 152/61   Pulse: 83   Resp: 18   Temp: 97.9 F (36.6 C)   SpO2: 94% 95%    Exam: Constitutional:   . Appears calm and comfortable Respiratory:  . CTA bilaterally, no w/r/r.  . Respiratory effort normal.  Cardiovascular:  . RRR, no m/r/g . 1=2+ BLE extremity edema   Abdomen:  . Soft, ntnd Musculoskeletal:  . RUE, LUE . Edematous, tender to touch bilaterally.  Hands well-perfused. Psychiatric:  . Mental status o Mood, affect appropriate  I have personally reviewed the following:   Data: . I/O o UOP: 2750 o BM: 10/6  CBG: Remains stable.  No lows.  Labs: Potassium stable at 5.4.  BUN and creatinine stable without significant change.  Magnesium within normal limits.  Scheduled Meds: . ALPRAZolam  0.5 mg Oral QHS  . carvedilol  12.5 mg Oral BID WC  . cholecalciferol  5,000 Units Oral Daily  . cloNIDine  0.1 mg Oral TID  . clopidogrel  75 mg Oral Q breakfast  . DULoxetine  60 mg Oral Daily  . famotidine  20 mg Oral QAC supper  . furosemide  40 mg Intravenous Daily  . gabapentin  300 mg Oral BID  . heparin injection (subcutaneous)  5,000 Units Subcutaneous Q8H  . hydrALAZINE  100 mg Oral TID  . insulin aspart  0-9 Units Subcutaneous Q4H  . isosorbide mononitrate  15 mg Oral Daily  . lipase/protease/amylase  12,000 Units Oral TID AC  .  lipase/protease/amylase)  20,880 Units Per Tube Once   And  . sodium bicarbonate  650 mg Per Tube Once  . mirtazapine  7.5 mg Oral QHS  . mometasone-formoterol  2 puff Inhalation BID  . nicotine  21 mg Transdermal Daily   Continuous Infusions: . feeding supplement (VITAL AF 1.2 CAL) 1,000 mL (06/13/18 1120)    Principal Problem:   Pancreatitis, recurrent Active Problems:   TOBACCO ABUSE   Essential hypertension   Atrial fibrillation (HCC)   COPD (chronic obstructive pulmonary disease) (HCC)   S/P CABG x 3   Anemia   GERD (gastroesophageal reflux disease)   Hyperlipidemia   CAD (coronary artery disease)   Chronic kidney disease (CKD), stage IV (severe) (HCC)   Chronic diastolic congestive heart failure (HCC)   Type II diabetes mellitus with renal manifestations (HCC)   SIRS (systemic inflammatory response syndrome) (HCC)   Protein-calorie malnutrition, severe   Anasarca   Demand ischemia (HCC)   Hyperkalemia   AKI (acute kidney injury) ([place])   Goals of care, counseling/discussion   Palliative care by specialist   LOS: 16 days

## 2018-06-13 NOTE — Progress Notes (Signed)
Daily Progress Note   Patient Name: [name]       Date: 06/13/2018 DOB: 1950-12-25  Age: 67 y.o. MRN#: [id number] Attending Physician: Samuella Cota, MD Primary Care Physician: Bernerd Limbo, MD Admit Date: 05/28/2018  Reason for Consultation/Follow-up: Establishing goals of care and Psychosocial/spiritual support  Subjective: Pain is better, asking for something to drink  Length of Stay: 16  Current Medications: Scheduled Meds:  . ALPRAZolam  0.5 mg Oral QHS  . carvedilol  12.5 mg Oral BID WC  . cholecalciferol  5,000 Units Oral Daily  . cloNIDine  0.1 mg Oral TID  . clopidogrel  75 mg Oral Q breakfast  . DULoxetine  60 mg Oral Daily  . famotidine  20 mg Oral QAC supper  . gabapentin  300 mg Oral BID  . hydrALAZINE  100 mg Oral TID  . insulin aspart  0-9 Units Subcutaneous Q4H  . isosorbide mononitrate  15 mg Oral Daily  . lipase/protease/amylase  12,000 Units Oral TID AC  . lipase/protease/amylase)  20,880 Units Per Tube Once   And  . sodium bicarbonate  650 mg Per Tube Once  . mirtazapine  7.5 mg Oral QHS  . mometasone-formoterol  2 puff Inhalation BID  . nicotine  21 mg Transdermal Daily    Continuous Infusions: . sodium chloride 75 mL/hr at 06/13/18 0456  . feeding supplement (VITAL AF 1.2 CAL)      PRN Meds: acetaminophen **OR** acetaminophen, alum & mag hydroxide-simeth, hydrALAZINE, HYDROcodone-acetaminophen, HYDROmorphone (DILAUDID) injection, hydrOXYzine, levalbuterol, loperamide, ondansetron (ZOFRAN) IV, polyethylene glycol, zolpidem  Physical Exam  Constitutional: She is oriented to person, place, and time. No distress.  HENT:  Head: Normocephalic and atraumatic.  Feeding tube In place  Cardiovascular: Normal rate and regular rhythm.  Pulmonary/Chest: Effort  normal and breath sounds normal. No respiratory distress.  Abdominal: Soft. Normal appearance.  Musculoskeletal: She exhibits edema.  Neurological: She is alert and oriented to person, place, and time.  Skin: Skin is warm and dry.    Vital Signs: BP (!) 152/61 (BP Location: Right Arm)   Pulse 83   Temp 97.9 F (36.6 C) (Oral)   Resp 18   Ht 5\' 5"  (1.651 m)   Wt 67.2 kg   SpO2 95%   BMI 24.65 kg/m  SpO2: SpO2: 95 % O2 Device: O2 Device: Nasal Cannula O2 Flow Rate: O2 Flow Rate (L/min): 2 L/min  Intake/output summary:   Intake/Output Summary (Last 24 hours) at 06/13/2018 1043 Last data filed at 06/13/2018 2637 Gross per 24 hour  Intake 1608.33 ml  Output 3100 ml  Net -1491.67 ml   LBM: Last BM Date: 06/11/18 Baseline Weight: Weight: 56.6 kg Most recent weight: Weight: 67.2 kg       Palliative Assessment/Data: PPS 20% (d/t intake)    Flowsheet Rows     Most Recent Value  Intake Tab  Referral Department  Hospitalist  Unit at Time of Referral  Cardiac/Telemetry Unit  Palliative Care Primary Diagnosis  Sepsis/Infectious Disease  Date Notified  06/08/18  Palliative Care Type  New Palliative care  Reason for referral  Clarify Goals of Care  Date of Admission  05/28/18  Date first seen by Palliative  Care  06/09/18  # of days Palliative referral response time  1 Day(s)  # of days IP prior to Palliative referral  11  Clinical Assessment  Palliative Performance Scale Score  20%  Psychosocial & Spiritual Assessment  Palliative Care Outcomes  Patient/Family meeting held?  Yes  Who was at the meeting?  2 daughters, sister  Palliative Care Outcomes  Clarified goals of care, Provided psychosocial or spiritual support      Patient Active Problem List   Diagnosis Date Noted  . AKI (acute kidney injury) ([place]) 06/09/2018  . Goals of care, counseling/discussion   . Palliative care by specialist   . Hyperkalemia 06/08/2018  . Anasarca 06/07/2018  . Demand ischemia ([place])  06/07/2018  . Protein-calorie malnutrition, severe 05/29/2018  . Type II diabetes mellitus with renal manifestations ([place]) 05/28/2018  . Pancreatitis, recurrent 05/28/2018  . SIRS (systemic inflammatory response syndrome) ([place]) 05/28/2018  . Pancreatitis 05/28/2018  . Volume overload 04/17/2018  . Symptomatic anemia 04/14/2018  . Acute gallstone pancreatitis 04/14/2018  . Acute cholecystitis 04/14/2018  . Gastrointestinal hemorrhage   . Fatigue associated with anemia 04/09/2018  . Hypoglycemia 01/19/2018  . Type II diabetes mellitus ([place]) 01/19/2018  . Nausea with vomiting 01/19/2018  . Uncontrolled hypertension 01/19/2018  . Hyperlipidemia 01/19/2018  . CAD (coronary artery disease) 01/19/2018  . COPD with hypoxia ([place]) 01/19/2018  . Chronic kidney disease (CKD), stage IV (severe) ([place]) 01/19/2018  . Retroperitoneal hematoma 01/19/2018  . Acute blood loss anemia 01/19/2018  . Chronic diastolic congestive heart failure ([place]) 01/19/2018  . AAA (abdominal aortic aneurysm) ([place]) 01/16/2018  . Elevated brain natriuretic peptide (BNP) level 10/07/2015  . Diabetes mellitus ([place]) 10/07/2015  . Pain in the chest   . PAD (peripheral artery disease) ([place]) 07/29/2015  . Chest pain 07/04/2015  . Acute on chronic kidney failure ([place]) 07/04/2015  . GERD (gastroesophageal reflux disease) 07/04/2015  . Anemia 06/26/2015  . Coronary artery disease involving autologous vein coronary bypass graft with unstable angina pectoris ([place]) 06/24/2015  . Angina at rest [facility])   . Subclavian artery stenosis, left ([place]) 06/23/2015  . AAA (abdominal aortic aneurysm) without rupture ([place]) 12/10/2014  . Subclavian steal syndrome 12/10/2014  . Renal artery stenosis ([place]) 09/04/2013  . Carotid artery stenosis 09/28/2012  . Mitral valve disorder 06/23/2011  . S/P CABG x 3 06/16/2011  . S/P Maze operation for atrial fibrillation 06/16/2011  . S/P MVR (mitral valve repair) 06/16/2011  . Follow-up examination  following surgery 03/29/2011  . CKD (chronic kidney disease) 03/03/2011  . COPD (chronic obstructive pulmonary disease) ([place]) 03/03/2011  . Chronic diastolic heart failure ([place]) 02/26/2011  . Hypotension 02/26/2011  . NSTEMI (non-ST elevated myocardial infarction) ([place]) 02/26/2011  . Long term (current) use of anticoagulants 12/17/2010  . OBSTRUCTIVE SLEEP APNEA 09/04/2010  . HYPOXEMIA 09/04/2010  . SNORING 08/05/2010  . Atrial fibrillation ([place]) 12/09/2009  . WEIGHT LOSS 03/28/2009  . PALPITATIONS 03/28/2009  . PVD 11/21/2008  . HYPERLIPIDEMIA-MIXED 11/20/2008  . TOBACCO ABUSE 11/20/2008  . Essential hypertension 11/20/2008  . Coronary artery disease involving native heart with angina pectoris ([place]) 11/20/2008  . ABDOMINAL AORTIC ANEURYSM 11/20/2008    Palliative Care Assessment & Plan   HPI: 67 y.o. female  with past medical history of hypertension, hyperlipidemia, DM, COPD on home oxygen, GERD, gout, depression, anxiety, tobacco abuse, atrial fibrillation not on anticoagulants, AAA, CAD, CABG, CHF, CKD4, GI bleeding, mitral valve regurgitation, PAD  admitted on 05/28/2018 with abdominal pain. Patient was recently admitted  at the beginning of August d/t GI bleed and gallstone pancreatitis. She had laparoscopic cholecystectomy during that hospitalization.  This admission, diagnosed with pancreatitis - recurrent vs smoldering. CT concerning for pseudocyst and necrosis. Autoimmune pancreatitis has been ruled out. Feeding tube was placed and enteral feeds started. 10/4 tube removed and replaced post pyloric. Patient also has AKI on CKD4 with unclear etiology. Nephrology has been consulted. PMT consulted for GOC, symptom management, and support for family.   Assessment: Follow up with patient and family to continue to offer support and assist with goals of care.   Patient tells me she feels much better - abdominal pain better. Got a walked significant distances yesterday. GI told her she  could start clears.   Spoke with patient's daughter [name] to follow up and address questions/concerns. [name] is pleased with improvement in abdominal pain and hopeful she continues to improve. She expresses concerns about finances and disposition. She is hopeful to speak with case Freight forwarder and Education officer, museum. At this time, she does not want mother to go to SNF and is working with family to plan for patient to live with them, have home health, and also hire private pay aids.   Questions and concerns were addressed. The family was encouraged to call with questions or concerns.   Recommendations/Plan:  PMT continuing to support patient and family throughout hospitalization - at this time they are hopeful for improvement as patient is experiencing less pain  They are working on plan to take patient home with home health and private pay aids  Symptoms well managed - only complaint is dry mouth relieved with mouth moisturizer gel given to patient  Goals of Care and Additional Recommendations:  Limitations on Scope of Treatment: Full Scope Treatment  Code Status:  Full code  Prognosis:   Unable to determine  Discharge Planning:  Home with Home Health??  Care plan was discussed with patient and daughter  Thank you for allowing the Palliative Medicine Team to assist in the care of this patient.   Total Time 20 minutes Prolonged Time Billed  no       Greater than 50%  of this time was spent counseling and coordinating care related to the above assessment and plan.  Juel Burrow, DNP, [facility] Palliative Medicine Team Team Phone # [phone]  Pager [phone]

## 2018-06-13 NOTE — Progress Notes (Signed)
Physical Therapy Treatment Patient Details Name: [name] MRN: [id number] DOB: Feb 22, 1951 Today's Date: 06/13/2018    History of Present Illness Pt is a 67 y/o female with c/o of abdominal pain. Note recent hospitalization 04/09/18 to 04/21/18 with GI bleed, gallstone pancreatitis, and during which she received laparoscopic cholecystectomy. Diagnosed with recurrent pancreatitis. PMH AAA, CHF, chronic back pain, CKD, COPD, GI bleed, HTN, MI, A-fib, DM, cardiac cath, CABG, maze procedure, mitral valve repair, R femoral-popliteal bypass     PT Comments    Continuing work on functional mobility and activity tolerance;  More fatigued this afternoon, and did not walk as far as yesterday's session, but did a good job of self-monitor for activity tolerance; Given fluctuations in activity tolerance, recommend a wheelchair for community, MD appts, etc; Noted O2 desaturation with amb on Room Air; worth considering performing O2 sat walk next session  Follow Up Recommendations  Home health PT;Supervision/Assistance - 24 hour     Equipment Recommendations  Wheelchair   Recommendations for Other Services       Precautions / Restrictions Precautions Precautions: Fall;Other (comment) Precaution Comments: watch SPO2/HR; Cortrak    Mobility  Bed Mobility               General bed mobility comments: Sitting EOB upon arrival  Transfers Overall transfer level: Needs assistance Equipment used: Rolling walker (2 wheeled) Transfers: Sit to/from Stand Sit to Stand: Mod assist         General transfer comment: Light mod assist to power up from bed and BSC; weaker this afternoon than last session  Ambulation/Gait Ambulation/Gait assistance: Min guard Gait Distance (Feet): 55 Feet Assistive device: Rolling walker (2 wheeled) Gait Pattern/deviations: Step-through pattern;Decreased step length - right;Decreased step length - left;Decreased stride length Gait velocity: slowed   General  Gait Details: More fatigued this afternoon, possibly related to time of day, or having just gotten up from commode; cues to self-monitor for activity tolerance   Stairs             Wheelchair Mobility    Modified Rankin (Stroke Patients Only)       Balance     Sitting balance-Leahy Scale: Fair       Standing balance-Leahy Scale: Fair                              Cognition Arousal/Alertness: Awake/alert Behavior During Therapy: WFL for tasks assessed/performed Overall Cognitive Status: Within Functional Limits for tasks assessed                                        Exercises      General Comments General comments (skin integrity, edema, etc.): Walked on room air as pt tells me she typically only uses supplemental O2 for sleeping; O2 sats 88-89% once done with amb; RN notified and we restarted supplemental O2; Also of note, sheets of her bed were soaked in clear fluid, and upon standing to transfer to the [facility], her IV angiocath fell to the floor; covered with clean washcloth and notified RN      Pertinent Vitals/Pain Pain Assessment: No/denies pain    Home Living                      Prior Function  PT Goals (current goals can now be found in the care plan section) Acute Rehab PT Goals Patient Stated Goal: Hopes to be able to go home PT Goal Formulation: With patient Time For Goal Achievement: 06/20/18 Potential to Achieve Goals: Good Progress towards PT goals: Progressing toward goals    Frequency    Min 3X/week      PT Plan Current plan remains appropriate    Co-evaluation              AM-PAC PT "6 Clicks" Daily Activity  Outcome Measure  Difficulty turning over in bed (including adjusting bedclothes, sheets and blankets)?: A Little Difficulty moving from lying on back to sitting on the side of the bed? : A Little Difficulty sitting down on and standing up from a chair with arms (e.g.,  wheelchair, bedside commode, etc,.)?: A Lot Help needed moving to and from a bed to chair (including a wheelchair)?: A Little Help needed walking in hospital room?: A Little Help needed climbing 3-5 steps with a railing? : A Lot 6 Click Score: 16    End of Session Equipment Utilized During Treatment: Gait belt Activity Tolerance: Patient tolerated treatment well Patient left: in chair;with call bell/phone within reach;with chair alarm set Nurse Communication: Mobility status PT Visit Diagnosis: Muscle weakness (generalized) (M62.81) Pain - part of body: (abdominal)     Time: 3419-6222 PT Time Calculation (min) (ACUTE ONLY): 35 min  Charges:  $Gait Training: 8-22 mins $Therapeutic Activity: 8-22 mins                     Roney Marion, [place] Pager [phone] Office [place] 06/13/2018, 4:11 PM

## 2018-06-13 NOTE — Progress Notes (Signed)
[facility] Progress Note   Subjective:     Afebrile, 2L [place], BPs stable  2.8 L of urine output yesterday after receiving 40 mg of IV Lasix  Serum creatinine stable at 2.32, potassium 5.4, bicarbonate 24, BUN 57  At this time not receiving enteral nutrition, surgery and gastroenterology are following  Remains diffusely edematous, hypoalbuminemic  Minimal abdominal pain at the current time  Objective Vitals:   06/12/18 2041 06/13/18 0451 06/13/18 0750 06/13/18 0847  BP: (!) 170/41 (!) 174/58 (!) 152/61   Pulse: 63 64 83   Resp: 18 18 18    Temp: 98 F (36.7 C) 98 F (36.7 C) 97.9 F (36.6 C)   TempSrc: Oral  Oral   SpO2: 96% 97% 94% 95%  Weight: 67.2 kg     Height:       Physical Exam General: chronically ill appearing but comfortable at 45 degrees in bed Heart: no rub, RRR Lungs: normal WOB, dec BS bases Abdomen: soft, mildly TTP epigastrium Extremities:  Anasarca 4 limbs, 2+ pitting; LUE AVF +t/b  Additional Objective Labs: Basic Metabolic Panel: Recent Labs  Lab 06/10/18 0628 06/11/18 0444 06/12/18 0422 06/13/18 0526  NA 138 137 139 140  K 5.1 5.4* 5.4* 5.4*  CL 96* 96* 100 103  CO2 29 30 28 24   GLUCOSE 123* 155* 84 82  BUN 62* 65* 59* 57*  CREATININE 2.63* 2.47* 2.28* 2.32*  CALCIUM 9.3 8.8* 9.1 9.0  PHOS 6.5*  --  6.0* 5.6*   Liver Function Tests: Recent Labs  Lab 06/07/18 0452 06/08/18 0648 06/10/18 0628 06/12/18 0422  AST 19 23  --  19  ALT 10 9  --  9  ALKPHOS 39 32*  --  35*  BILITOT 0.6 0.8  --  1.0  PROT 5.1* 5.3*  --  5.2*  ALBUMIN 2.0* 2.1* 2.0* 2.1*   Recent Labs  Lab 06/10/18 0628 06/11/18 0444 06/12/18 0422  LIPASE 205* 1,307* 140*   CBC: Recent Labs  Lab 06/07/18 0452 06/09/18 0708 06/12/18 0422  WBC 13.7* 13.0* 13.5*  HGB 8.2* 8.3* 8.5*  HCT 25.7* 26.6* 27.6*  MCV 98.5 100.8* 101.1*  PLT 339 357 352   Blood Culture    Component Value Date/Time   SDES BLOOD RIGHT HAND 06/05/2018 1453   SPECREQUEST  06/05/2018 1453    BOTTLES DRAWN AEROBIC ONLY Blood Culture adequate volume   CULT  06/05/2018 1453    NO GROWTH 5 DAYS Performed at [facility], [address], [place], [place] [address]    REPTSTATUS 06/10/2018 FINAL 06/05/2018 1453    Cardiac Enzymes: No results for input(s): CKTOTAL, CKMB, CKMBINDEX, TROPONINI in the last 168 hours. CBG: Recent Labs  Lab 06/12/18 1630 06/12/18 2038 06/13/18 0025 06/13/18 0450 06/13/18 0749  GLUCAP 81 81 78 77 91   Iron Studies: No results for input(s): IRON, TIBC, TRANSFERRIN, FERRITIN in the last 72 hours. @lablastinr3 @ Studies/Results: No results found. Medications: . sodium chloride 75 mL/hr at 06/13/18 0456  . feeding supplement (VITAL AF 1.2 CAL)     . ALPRAZolam  0.5 mg Oral QHS  . carvedilol  12.5 mg Oral BID WC  . cholecalciferol  5,000 Units Oral Daily  . cloNIDine  0.1 mg Oral TID  . clopidogrel  75 mg Oral Q breakfast  . DULoxetine  60 mg Oral Daily  . famotidine  20 mg Oral QAC supper  . furosemide  40 mg Intravenous Daily  . gabapentin  300 mg  Oral BID  . heparin injection (subcutaneous)  5,000 Units Subcutaneous Q8H  . hydrALAZINE  100 mg Oral TID  . insulin aspart  0-9 Units Subcutaneous Q4H  . isosorbide mononitrate  15 mg Oral Daily  . lipase/protease/amylase  12,000 Units Oral TID AC  . lipase/protease/amylase)  20,880 Units Per Tube Once   And  . sodium bicarbonate  650 mg Per Tube Once  . mirtazapine  7.5 mg Oral QHS  . mometasone-formoterol  2 puff Inhalation BID  . nicotine  21 mg Transdermal Daily    Assessment/Plan: 1.  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  Followed by Posey Pronto at [facility].  Has LUV AVF.  Renal function stable over past 24h, will continue daily 40 mg IV furosemide at the current time  2.  HTN: Currently is acceptably controlled  3.  Hyperkalemia: modest, 5.4 today. CTM.    4.  Anemia:  Hb stable  in 8s.  Rec ESA 10/4. CTM.   Pearson Grippe MD 06/13/2018, 11:25 AM  [facility]

## 2018-06-14 ENCOUNTER — Inpatient Hospital Stay (HOSPITAL_COMMUNITY): Payer: Medicare Other

## 2018-06-14 DIAGNOSIS — R194 Change in bowel habit: Secondary | ICD-10-CM

## 2018-06-14 DIAGNOSIS — M7989 Other specified soft tissue disorders: Secondary | ICD-10-CM

## 2018-06-14 DIAGNOSIS — R52 Pain, unspecified: Secondary | ICD-10-CM

## 2018-06-14 LAB — CBC
HCT: 26.2 % — ABNORMAL LOW (ref 36.0–46.0)
HEMOGLOBIN: 7.9 g/dL — AB (ref 12.0–15.0)
MCH: 30.3 pg (ref 26.0–34.0)
MCHC: 30.2 g/dL (ref 30.0–36.0)
MCV: 100.4 fL — ABNORMAL HIGH (ref 80.0–100.0)
Platelets: 348 10*3/uL (ref 150–400)
RBC: 2.61 MIL/uL — ABNORMAL LOW (ref 3.87–5.11)
RDW: 18.1 % — ABNORMAL HIGH (ref 11.5–15.5)
WBC: 15 10*3/uL — ABNORMAL HIGH (ref 4.0–10.5)
nRBC: 0 % (ref 0.0–0.2)

## 2018-06-14 LAB — BASIC METABOLIC PANEL
Anion gap: 7 (ref 5–15)
BUN: 61 mg/dL — AB (ref 8–23)
CHLORIDE: 103 mmol/L (ref 98–111)
CO2: 28 mmol/L (ref 22–32)
CREATININE: 2.02 mg/dL — AB (ref 0.44–1.00)
Calcium: 8.8 mg/dL — ABNORMAL LOW (ref 8.9–10.3)
GFR calc Af Amer: 29 mL/min — ABNORMAL LOW (ref >60)
GFR calc non Af Amer: 25 mL/min — ABNORMAL LOW (ref >60)
GLUCOSE: 163 mg/dL — AB (ref 70–99)
Potassium: 4.7 mmol/L (ref 3.5–5.1)
SODIUM: 138 mmol/L (ref 135–145)

## 2018-06-14 LAB — GLUCOSE, CAPILLARY
GLUCOSE-CAPILLARY: 142 mg/dL — AB (ref 70–99)
GLUCOSE-CAPILLARY: 149 mg/dL — AB (ref 70–99)
GLUCOSE-CAPILLARY: 167 mg/dL — AB (ref 70–99)
GLUCOSE-CAPILLARY: 114 mg/dL — AB (ref 70–99)
Glucose-Capillary: 162 mg/dL — ABNORMAL HIGH (ref 70–99)
Glucose-Capillary: 167 mg/dL — ABNORMAL HIGH (ref 70–99)
Glucose-Capillary: 184 mg/dL — ABNORMAL HIGH (ref 70–99)

## 2018-06-14 MED ORDER — VITAMIN B-12 100 MCG PO TABS
100.0000 ug | ORAL_TABLET | Freq: Every day | ORAL | Status: DC
  Administered 2018-06-14 – 2018-06-19 (×4): 100 ug
  Filled 2018-06-14 (×6): qty 1

## 2018-06-14 MED ORDER — PHENOL 1.4 % MT LIQD
1.0000 | OROMUCOSAL | Status: DC | PRN
  Administered 2018-06-14: 1 via OROMUCOSAL
  Filled 2018-06-14: qty 177

## 2018-06-14 MED ORDER — FERROUS SULFATE 325 (65 FE) MG PO TABS
325.0000 mg | ORAL_TABLET | Freq: Two times a day (BID) | ORAL | Status: DC
  Administered 2018-06-14: 325 mg via ORAL
  Filled 2018-06-14: qty 1

## 2018-06-14 MED ORDER — VITAL 1.5 CAL PO LIQD
1000.0000 mL | ORAL | Status: DC
  Administered 2018-06-14: 1000 mL
  Filled 2018-06-14 (×3): qty 1000

## 2018-06-14 MED ORDER — FUROSEMIDE 40 MG PO TABS
40.0000 mg | ORAL_TABLET | Freq: Two times a day (BID) | ORAL | Status: DC
  Administered 2018-06-14: 40 mg via ORAL
  Filled 2018-06-14: qty 1

## 2018-06-14 MED ORDER — DOXYCYCLINE CALCIUM 50 MG/5ML PO SYRP
100.0000 mg | ORAL_SOLUTION | Freq: Two times a day (BID) | ORAL | Status: AC
  Administered 2018-06-14 – 2018-06-15 (×3): 100 mg
  Filled 2018-06-14 (×5): qty 10

## 2018-06-14 NOTE — Progress Notes (Signed)
Right upper extremity venous duplex has been completed. Negative for DVT. There is evidence of acute superficial vein thrombosis involving the cephalic vein of the right upper extremity. Results were given to the patient's nurse, Elmyra Ricks.  06/14/18 2:08 PM [name] RVT

## 2018-06-14 NOTE — Progress Notes (Signed)
PROGRESS NOTE    [name]  PIR:[id number] DOB: 1950-12-16 DOA: 05/28/2018 PCP: Bernerd Limbo, MD    Brief Narrative: 67 year old woman PMH gallstone pancreatitis August 2019, status post laparoscopic cholecystectomy same hospitalization, presented with abdominal pain, nausea, vomiting.  Admitted for recurrent pancreatitis.  She was seen by gastroenterology, cortrak tube was placed and she was started on enteral feeds.  Continuation has been very slow to improve and was complicated by a mild ileus, acute kidney injury and anasarca.  Plan at this point is to continue diuresis as tolerated, advance diet as per GI and general surgery, likely home, possibly with tube feeds, towards the end of the week  Assessment & Plan:   Principal Problem:   Pancreatitis, recurrent Active Problems:   TOBACCO ABUSE   Essential hypertension   Atrial fibrillation (HCC)   COPD (chronic obstructive pulmonary disease) (HCC)   S/P CABG x 3   Anemia   GERD (gastroesophageal reflux disease)   Hyperlipidemia   CAD (coronary artery disease)   Chronic kidney disease (CKD), stage IV (severe) (HCC)   Chronic diastolic congestive heart failure (HCC)   Type II diabetes mellitus with renal manifestations (HCC)   SIRS (systemic inflammatory response syndrome) (HCC)   Protein-calorie malnutrition, severe   Anasarca   Demand ischemia (HCC)   Hyperkalemia   AKI (acute kidney injury) ([place])   Goals of care, counseling/discussion   Palliative care by specialist   Acute pancreatitis, recurrent vs unresolved. Developing pseudocyst.  Clear diet.  Has tube feeding, below ligament of treitz.  S/P cholecystectomy 8-14   Severe protein caloric malnutrition;  On tube feeding.  Develops diarrhea, will ask nutritionist to change formula.    Mild ileus; resolved. Sx was following.   AKI on CKD stage IV;  Stable. Received IV lasix.  Change to oral today.    Anasarca secondary to hypoalbuminemia, aggressive volume  resuscitation Received IV lasix. Now on oral lasix.   Right upper extremity with worsening edema, redness over last 4 days.  Repeat doppler. Start doxy to treat for cellulitis.   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  Colonoscopy planned October 28.  Was not iron deficient August. --Stable status post transfusion.    A fib; stable.   COPD, chronic hypoxic respiratory failure on oxygen at home --Remains stable.  Demand ischemia.  PMH CAD, CABG. --Cardiology recommended medical management, consider outpatient stress test.  Anemia; repeat in am. Start ferrous sulfate    DVT prophylaxis: heparin  Code Status: full code.  Family Communication: care discussed with patient.  Disposition Plan: home when clear by GI   Consultants:   Sx  GI  Nephrology    Procedures:  cortrak feeding tube placement on 9/23  Transfusion 1 unit PRBC Echo; Normal LVF with mild BSH, mild MR and TR, mild LVE, grade 3 diastolic dysfunction.. S/P mitral valve repair with moderate MS. No significant change from prior echo The right ventricular systolic pressure was increased consistent with mild pulmonary  hypertension.   Antimicrobials: merrem.    Subjective: Develops diarrhea overnight.  Report worsening edema right arm and redness for last few days.  Mild abdominal discomfort.   Objective: Vitals:   06/13/18 2055 06/14/18 0437 06/14/18 0728 06/14/18 0915  BP:  (!) 189/60 (!) 175/50   Pulse:  76 69   Resp:  18 18   Temp:  98.7 F (37.1 C) 98.6 F (37 C)   TempSrc:  Oral Oral   SpO2: 95% 95%  96% 92%  Weight:      Height:        Intake/Output Summary (Last 24 hours) at 06/14/2018 1011 Last data filed at 06/14/2018 0925 Gross per 24 hour  Intake 2157.8 ml  Output 1150 ml  Net 1007.8 ml   Filed Weights   06/10/18 2012 06/11/18 0500 06/12/18 2041  Weight: 67.1 kg 67.1 kg 67.2 kg    Examination:  General exam: Appears calm and  comfortable  Respiratory system: Clear to auscultation. Respiratory effort normal. Cardiovascular system: S1 & S2 heard, RRR. No JVD, murmurs, rubs, gallops or clicks. No pedal edema. Gastrointestinal system:bs present, soft, mild tender.  Central nervous system: Alert and oriented. No focal neurological deficits. Extremities: Symmetric 5 x 5 power. Skin: No rashes, lesions or ulcers Psychiatry: Judgement and insight appear normal. Mood & affect appropriate.     Data Reviewed: I have personally reviewed following labs and imaging studies  CBC: Recent Labs  Lab 06/09/18 0708 06/12/18 0422 06/14/18 0818  WBC 13.0* 13.5* 15.0*  HGB 8.3* 8.5* 7.9*  HCT 26.6* 27.6* 26.2*  MCV 100.8* 101.1* 100.4*  PLT 357 352 914   Basic Metabolic Panel: Recent Labs  Lab 06/10/18 0628 06/11/18 0444 06/12/18 0422 06/13/18 0526 06/14/18 0818  NA 138 137 139 140 138  K 5.1 5.4* 5.4* 5.4* 4.7  CL 96* 96* 100 103 103  CO2 29 30 28 24 28   GLUCOSE 123* 155* 84 82 163*  BUN 62* 65* 59* 57* 61*  CREATININE 2.63* 2.47* 2.28* 2.32* 2.02*  CALCIUM 9.3 8.8* 9.1 9.0 8.8*  MG  --   --  1.9 2.1  --   PHOS 6.5*  --  6.0* 5.6*  --    GFR: Estimated Creatinine Clearance: 25 mL/min (A) (by C-G formula based on SCr of 2.02 mg/dL (H)). Liver Function Tests: Recent Labs  Lab 06/08/18 0648 06/10/18 0628 06/12/18 0422  AST 23  --  19  ALT 9  --  9  ALKPHOS 32*  --  35*  BILITOT 0.8  --  1.0  PROT 5.3*  --  5.2*  ALBUMIN 2.1* 2.0* 2.1*   Recent Labs  Lab 06/08/18 0648 06/09/18 0708 06/10/18 0628 06/11/18 0444 06/12/18 0422  LIPASE 677* 179* 205* 1,307* 140*   No results for input(s): AMMONIA in the last 168 hours. Coagulation Profile: No results for input(s): INR, PROTIME in the last 168 hours. Cardiac Enzymes: No results for input(s): CKTOTAL, CKMB, CKMBINDEX, TROPONINI in the last 168 hours. BNP (last 3 results) No results for input(s): PROBNP in the last 8760 hours. HbA1C: No results  for input(s): HGBA1C in the last 72 hours. CBG: Recent Labs  Lab 06/13/18 1620 06/13/18 2021 06/14/18 0002 06/14/18 0436 06/14/18 0729  GLUCAP 175* 152* 162* 142* 149*   Lipid Profile: No results for input(s): CHOL, HDL, LDLCALC, TRIG, CHOLHDL, LDLDIRECT in the last 72 hours. Thyroid Function Tests: No results for input(s): TSH, T4TOTAL, FREET4, T3FREE, THYROIDAB in the last 72 hours. Anemia Panel: No results for input(s): VITAMINB12, FOLATE, FERRITIN, TIBC, IRON, RETICCTPCT in the last 72 hours. Sepsis Labs: No results for input(s): PROCALCITON, LATICACIDVEN in the last 168 hours.  Recent Results (from the past 240 hour(s))  Culture, blood (routine x 2)     Status: None   Collection Time: 06/05/18  2:45 PM  Result Value Ref Range Status   Specimen Description BLOOD RIGHT HAND  Final   Special Requests   Final    BOTTLES DRAWN AEROBIC  ONLY Blood Culture adequate volume   Culture   Final    NO GROWTH 5 DAYS Performed at [facility], [place] [address], [place], [place] [address]    Report Status 06/10/2018 FINAL  Final  Culture, blood (routine x 2)     Status: None   Collection Time: 06/05/18  2:53 PM  Result Value Ref Range Status   Specimen Description BLOOD RIGHT HAND  Final   Special Requests   Final    BOTTLES DRAWN AEROBIC ONLY Blood Culture adequate volume   Culture   Final    NO GROWTH 5 DAYS Performed at [facility], [place] [address], [place], [place] [address]    Report Status 06/10/2018 FINAL  Final         Radiology Studies: No results found.      Scheduled Meds: . ALPRAZolam  0.5 mg Oral QHS  . carvedilol  12.5 mg Oral BID WC  . cholecalciferol  5,000 Units Oral Daily  . cloNIDine  0.1 mg Oral TID  . clopidogrel  75 mg Oral Q breakfast  . DULoxetine  60 mg Oral Daily  . famotidine  20 mg Oral QAC supper  . furosemide  40 mg Intravenous Daily  . gabapentin  300 mg Oral BID  . heparin injection (subcutaneous)  5,000 Units  Subcutaneous Q8H  . hydrALAZINE  100 mg Oral TID  . insulin aspart  0-9 Units Subcutaneous Q4H  . isosorbide mononitrate  15 mg Oral Daily  . lipase/protease/amylase  12,000 Units Oral TID AC  . lipase/protease/amylase)  20,880 Units Per Tube Once   And  . sodium bicarbonate  650 mg Per Tube Once  . mirtazapine  7.5 mg Oral QHS  . mometasone-formoterol  2 puff Inhalation BID  . nicotine  21 mg Transdermal Daily   Continuous Infusions: . feeding supplement (VITAL AF 1.2 CAL) 1,000 mL (06/13/18 1120)     LOS: 17 days    Time spent: 35 minutes./     Elmarie Shiley, MD [facility] Pager [phone]  If 7PM-7AM, please contact night-coverage [email or web address] Password [email or web address] 06/14/2018, 10:11 AM

## 2018-06-14 NOTE — Progress Notes (Signed)
Nutrition Follow-up  DOCUMENTATION CODES:   Severe malnutrition in context of chronic illness  INTERVENTION:   Tube Feeding:  Change to Vital 1.5 @ 55 ml/hr Provides 89 g of protein, 1980 kcals, 1003 mL of free water Meets 100% esitmated calorie and protein needs   NUTRITION DIAGNOSIS:   Severe Malnutrition related to chronic illness(CHF, AAA s/p graft repair, pancreatitis) as evidenced by severe muscle depletion, moderate muscle depletion, mild fat depletion, moderate fat depletion, energy intake < or equal to 75% for > or equal to 1 month.  Being addressed via TF   GOAL:   Patient will meet greater than or equal to 90% of their needs  Met  MONITOR:   Diet advancement, Labs, TF tolerance, I & O's, Weight trends  REASON FOR ASSESSMENT:   Consult Enteral/tube feeding initiation and management  ASSESSMENT:   Patient with PMH significant for HTN, HLD, DM, COPD, GERD, gout, depression with anxiety, tobacco abuse, AAA s/p stent graft repair 01/2018, CAD, CABG, CHF, CKD IV s/p AV fistula for anticipated transition to HD, GI bleeding, mitral valve regurgitation, PAD, and s/p lap chole 04/19/18. Presents this admission with abdominal pain. Admitted for recurrent vs never resolved acute pancreatitis and GIB.   Tolerating some CL   Vital AF 1.2 infusing at 60 ml/hr via Cortrak tube 4 loose stools in past 12-18 hours post resuming TF.   Pt diffusely edematous  Hgb remains stable around 8; noted nephrology recommended ESA   Labs: potassium 4.7 (wdl), BUN 61, Creatinine 2.02 Meds: Vitamin B-12, remeron, imodium prn, creon with meals, lasix, cholecalciferol  Diet Order:   Diet Order            Diet clear liquid Room service appropriate? Yes; Fluid consistency: Thin  Diet effective now              EDUCATION NEEDS:   Not appropriate for education at this time  Skin:  Skin Assessment: Reviewed RN Assessment  Last BM:  10/9  Height:   Ht Readings from Last 1  Encounters:  05/30/18 5' 5" (1.651 m)    Weight:   Wt Readings from Last 1 Encounters:  06/12/18 67.2 kg    Ideal Body Weight:  56.8 kg  BMI:  Body mass index is 24.65 kg/m.  Estimated Nutritional Needs:   Kcal:  1700-1900 kcal  Protein:  85-100 g  Fluid:  >/= 1.7 L/day    Kerman Passey MS, RD, LDN, CNSC [phone] Pager  [phone] Weekend/On-Call Pager

## 2018-06-14 NOTE — Progress Notes (Signed)
[facility] Progress Note   Subjective:     Afebrile, 2L [place], BPs up some  Now on enteral nutrition, having diarrhea  1.3 L of urine output yesterday received 40 mg of IV Lasix  Serum creatinine stable at 2.02, potassium 4.7, bicarbonate 28, BUN 61  Remains diffusely edematous, hypoalbuminemic  Objective Vitals:   06/13/18 2055 06/14/18 0437 06/14/18 0728 06/14/18 0915  BP:  (!) 189/60 (!) 175/50   Pulse:  76 69   Resp:  18 18   Temp:  98.7 F (37.1 C) 98.6 F (37 C)   TempSrc:  Oral Oral   SpO2: 95% 95% 96% 92%  Weight:      Height:       Physical Exam General: chronically ill appearing but comfortable at 45 degrees in bed Heart: no rub, RRR Lungs: normal WOB, dec BS bases Abdomen: soft, mildly TTP epigastrium Extremities:  Anasarca 4 limbs, 2+ pitting; LUE AVF +t/b  Additional Objective Labs: Basic Metabolic Panel: Recent Labs  Lab 06/10/18 0628  06/12/18 0422 06/13/18 0526 06/14/18 0818  NA 138   < > 139 140 138  K 5.1   < > 5.4* 5.4* 4.7  CL 96*   < > 100 103 103  CO2 29   < > 28 24 28   GLUCOSE 123*   < > 84 82 163*  BUN 62*   < > 59* 57* 61*  CREATININE 2.63*   < > 2.28* 2.32* 2.02*  CALCIUM 9.3   < > 9.1 9.0 8.8*  PHOS 6.5*  --  6.0* 5.6*  --    < > = values in this interval not displayed.   Liver Function Tests: Recent Labs  Lab 06/08/18 0648 06/10/18 0628 06/12/18 0422  AST 23  --  19  ALT 9  --  9  ALKPHOS 32*  --  35*  BILITOT 0.8  --  1.0  PROT 5.3*  --  5.2*  ALBUMIN 2.1* 2.0* 2.1*   Recent Labs  Lab 06/10/18 0628 06/11/18 0444 06/12/18 0422  LIPASE 205* 1,307* 140*   CBC: Recent Labs  Lab 06/09/18 0708 06/12/18 0422 06/14/18 0818  WBC 13.0* 13.5* 15.0*  HGB 8.3* 8.5* 7.9*  HCT 26.6* 27.6* 26.2*  MCV 100.8* 101.1* 100.4*  PLT 357 352 348   Blood Culture    Component Value Date/Time   SDES BLOOD RIGHT HAND 06/05/2018 1453   SPECREQUEST  06/05/2018 1453    BOTTLES DRAWN AEROBIC ONLY Blood Culture  adequate volume   CULT  06/05/2018 1453    NO GROWTH 5 DAYS Performed at [facility], [place] [address], [place], [place] [address]    REPTSTATUS 06/10/2018 FINAL 06/05/2018 1453    Cardiac Enzymes: No results for input(s): CKTOTAL, CKMB, CKMBINDEX, TROPONINI in the last 168 hours. CBG: Recent Labs  Lab 06/13/18 1620 06/13/18 2021 06/14/18 0002 06/14/18 0436 06/14/18 0729  GLUCAP 175* 152* 162* 142* 149*   Iron Studies: No results for input(s): IRON, TIBC, TRANSFERRIN, FERRITIN in the last 72 hours. @lablastinr3 @ Studies/Results: No results found. Medications: . feeding supplement (VITAL AF 1.2 CAL) 1,000 mL (06/13/18 1120)   . ALPRAZolam  0.5 mg Oral QHS  . carvedilol  12.5 mg Oral BID WC  . cholecalciferol  5,000 Units Oral Daily  . cloNIDine  0.1 mg Oral TID  . clopidogrel  75 mg Oral Q breakfast  . doxycycline  100 mg Per Tube Q12H  . DULoxetine  60 mg Oral Daily  .  famotidine  20 mg Oral QAC supper  . furosemide  40 mg Intravenous Daily  . gabapentin  300 mg Oral BID  . heparin injection (subcutaneous)  5,000 Units Subcutaneous Q8H  . hydrALAZINE  100 mg Oral TID  . insulin aspart  0-9 Units Subcutaneous Q4H  . isosorbide mononitrate  15 mg Oral Daily  . lipase/protease/amylase  12,000 Units Oral TID AC  . lipase/protease/amylase)  20,880 Units Per Tube Once   And  . sodium bicarbonate  650 mg Per Tube Once  . mirtazapine  7.5 mg Oral QHS  . mometasone-formoterol  2 puff Inhalation BID  . nicotine  21 mg Transdermal Daily  . vitamin B-12  100 mcg Per Tube Daily    Assessment/Plan: 1.  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  Followed by Posey Pronto at [facility].  Has LUV AVF.  Renal function stable over past 24h, will change to PO furosemide 40 BID.    2.  HTN: labile, CTM; resume amlodipine if remains elevated  3.  Hyperkalemia: resoved.  CTM.    4.  Anemia:  Hb stable. Rec ESA 10/4. CTM.    Will sign off for now.  Please call with any questions or concerns.  Pt does need follow up with nephrology and I will arrange f/u labs and visit with Dr. Posey Pronto.   Pearson Grippe MD 06/14/2018, 11:35 AM  [facility]

## 2018-06-14 NOTE — Progress Notes (Signed)
[facility] Progress Note      Assessment/Plan AKI on CKD Anasarca  malnutrition Chronic diastolic HF Acute on chronic anemia  A.fib COPD on home O2 Diarrhea  DM2  Atherosclerosis  Ileus, likely secondary to severe necrotizing pancreatitis with pseudocyst, history of gallstone pancreatitis, s/p lap chole on 04-19-18 - afebrile, VSS - post-pyloric PANDA placed 10/4, tolerating TFs - lipase trending down  FEN: TF @ goal 60 cc/hr, CLD  ID: Zosyn 9/22-9/30, Merrem 9/30-10/5 VTE: SCD's Follow up: TBD  Plan:  no need for ct right now, no indication for surgery, we will follow   LOS: 17 days    Subjective: CC: diarrhea  RLQ abdominal pain with onset of diarrhea. Different than her previous pain. No nausea or vomiting, fever or chills overnight. Tolerated clears. No blood in her stool.   Objective: Vital signs in last 24 hours: Temp:  [97.4 F (36.3 C)-98.7 F (37.1 C)] 98.6 F (37 C) (10/09 0728) Pulse Rate:  [69-78] 69 (10/09 0728) Resp:  [18] 18 (10/09 0728) BP: (174-198)/(50-63) 175/50 (10/09 0728) SpO2:  [92 %-96 %] 92 % (10/09 0915) Last BM Date: 06/13/18  Intake/Output from previous day: 10/08 0701 - 10/09 0700 In: 2237.8 [P.O.:400; I.V.:627.8; NG/GT:1120] Out: 1500 [Urine:1300; Emesis/NG output:200] Intake/Output this shift: No intake/output data recorded.  PE: Gen:  Alert, NAD, pleasant, cooperative Pulm:  Rate and effort normal Abd: Soft, ND, +BS, no HSM, mild TTP in RLQ Skin: warm and dry   Anti-infectives: Anti-infectives (From admission, onward)   Start     Dose/Rate Route Frequency Ordered Stop   06/05/18 1445  meropenem (MERREM) 1 g in sodium chloride 0.9 % 100 mL IVPB     1 g 200 mL/hr over 30 Minutes Intravenous Every 12 hours 06/05/18 1433 06/10/18 1019   05/29/18 0600  piperacillin-tazobactam (ZOSYN) IVPB 3.375 g     3.375 g 12.5 mL/hr over 240 Minutes Intravenous Every 8 hours 05/28/18 2227 06/05/18 1600   05/28/18 2230  piperacillin-tazobactam (ZOSYN) IVPB 3.375 g     3.375 g 100 mL/hr over 30 Minutes Intravenous  Once 05/28/18 2227 05/29/18 0011      Lab Results:  Recent Labs    06/12/18 0422 06/14/18 0818  WBC 13.5* 15.0*  HGB 8.5* 7.9*  HCT 27.6* 26.2*  PLT 352 348   BMET Recent Labs    06/13/18 0526 06/14/18 0818  NA 140 138  K 5.4* 4.7  CL 103 103  CO2 24 28  GLUCOSE 82 163*  BUN 57* 61*  CREATININE 2.32* 2.02*  CALCIUM 9.0 8.8*   PT/INR No results for input(s): LABPROT, INR in the last 72 hours. CMP     Component Value Date/Time   NA 138 06/14/2018 0818   K 4.7 06/14/2018 0818   CL 103 06/14/2018 0818   CO2 28 06/14/2018 0818   GLUCOSE 163 (H) 06/14/2018 0818   BUN 61 (H) 06/14/2018 0818   CREATININE 2.02 (H) 06/14/2018 0818   CREATININE 1.32 (H) 06/17/2015 1257   CALCIUM 8.8 (L) 06/14/2018 0818   PROT 5.2 (L) 06/12/2018 0422   ALBUMIN 2.1 (L) 06/12/2018 0422   AST 19 06/12/2018 0422   ALT 9 06/12/2018 0422   ALKPHOS 35 (L) 06/12/2018 0422   BILITOT 1.0 06/12/2018 0422   GFRNONAA 25 (L) 06/14/2018 0818   GFRAA 29 (L) 06/14/2018 0818   Lipase     Component Value Date/Time   LIPASE 140 (H) 06/12/2018 0422    Studies/Results: No results found.  Kalman Drape , [facility] 06/14/2018, 9:53 AM  Pager: [phone] Mon-Wed, Friday 7:00am-4:30pm Thurs 7am-11:30am  Consults: [phone]

## 2018-06-14 NOTE — Progress Notes (Signed)
Daily Rounding Note  06/14/2018, 10:24 AM  LOS: 17 days   SUBJECTIVE:   Chief complaint: Abdominal pain.  Overall abdominal pain persists but is improved.  She complains of pain in her right arm.  This has been present for a while and she had Doppler studies on 9/30 which showed no evidence for DVT. Tolerating postpyloric tube feedings at goal rate of 60 cc/hr with no nausea.  However with restarting of continuous tube feeds at goal rate yesterday, she developed more frequent, "muddy" stools last night and these have continued this morning.  She is had about 4 so far in the last 12 to 16 hours. She is walking in the hall with PT but remains weak.  OBJECTIVE:         Vital signs in last 24 hours:    Temp:  [97.4 F (36.3 C)-98.7 F (37.1 C)] 98.6 F (37 C) (10/09 0728) Pulse Rate:  [69-78] 69 (10/09 0728) Resp:  [18] 18 (10/09 0728) BP: (174-198)/(50-63) 175/50 (10/09 0728) SpO2:  [92 %-96 %] 92 % (10/09 0915) Last BM Date: 06/13/18 Filed Weights   06/10/18 2012 06/11/18 0500 06/12/18 2041  Weight: 67.1 kg 67.1 kg 67.2 kg   General: Looks frail, perhaps slightly better than she did a week ago but still very chronically ill looking debilitated Heart: RRR. Chest: Clear bilaterally. Abdomen: Soft.  Slightly tender on the right mid abdomen.  No guarding or rebound. Extremities: Edema in all limbs especially the right upper limb. Neuro/Psych: Alert.  Oriented x3.  No tremors, no limb weakness.  Intake/Output from previous day: 10/08 0701 - 10/09 0700 In: 2237.8 [P.O.:400; I.V.:627.8; NG/GT:1120] Out: 1500 [Urine:1300; Emesis/NG output:200]  Intake/Output this shift: Total I/O In: 220 [P.O.:220] Out: -   Lab Results: Recent Labs    06/12/18 0422 06/14/18 0818  WBC 13.5* 15.0*  HGB 8.5* 7.9*  HCT 27.6* 26.2*  PLT 352 348   BMET Recent Labs    06/12/18 0422 06/13/18 0526 06/14/18 0818  NA 139 140 138  K  5.4* 5.4* 4.7  CL 100 103 103  CO2 28 24 28   GLUCOSE 84 82 163*  BUN 59* 57* 61*  CREATININE 2.28* 2.32* 2.02*  CALCIUM 9.1 9.0 8.8*   LFT Recent Labs    06/12/18 0422  PROT 5.2*  ALBUMIN 2.1*  AST 19  ALT 9  ALKPHOS 35*  BILITOT 1.0   Scheduled Meds: . ALPRAZolam  0.5 mg Oral QHS  . carvedilol  12.5 mg Oral BID WC  . cholecalciferol  5,000 Units Oral Daily  . cloNIDine  0.1 mg Oral TID  . clopidogrel  75 mg Oral Q breakfast  . doxycycline  100 mg Per Tube Q12H  . DULoxetine  60 mg Oral Daily  . famotidine  20 mg Oral QAC supper  . furosemide  40 mg Intravenous Daily  . gabapentin  300 mg Oral BID  . heparin injection (subcutaneous)  5,000 Units Subcutaneous Q8H  . hydrALAZINE  100 mg Oral TID  . insulin aspart  0-9 Units Subcutaneous Q4H  . isosorbide mononitrate  15 mg Oral Daily  . lipase/protease/amylase  12,000 Units Oral TID AC  . lipase/protease/amylase)  20,880 Units Per Tube Once   And  . sodium bicarbonate  650 mg Per Tube Once  . mirtazapine  7.5 mg Oral QHS  . mometasone-formoterol  2 puff Inhalation BID  . nicotine  21 mg Transdermal Daily  . vitamin  B-12  100 mcg Per Tube Daily   Continuous Infusions: . feeding supplement (VITAL AF 1.2 CAL) 1,000 mL (06/13/18 1120)   PRN Meds:.acetaminophen **OR** acetaminophen, alum & mag hydroxide-simeth, hydrALAZINE, HYDROcodone-acetaminophen, HYDROmorphone (DILAUDID) injection, hydrOXYzine, levalbuterol, loperamide, ondansetron (ZOFRAN) IV, polyethylene glycol, zolpidem   ASSESMENT:   *    Severe recurrent pancreatitis versus unresolved acute pancreatitis.  Pancreatic necrosis and evolving pseudocyst. Latest noncontrast CT 05/28/2018 Zosyn 9/22-9/30, Merrem 9/30-10/5 Abdominal pain  *     Ileus in setting of severe pancreatitis.  *    Cholecystectomy 04/19/2018, following diagnosis of gallstone pancreatitis.  *   Stage 5 CKD.  Limits imaging/contrast options.  *     01/2018 repair AAA.  *    Increased  stool frequency within the last 12 hours.  Timing seems to correspond with restarting of continuous tube feeds at goal rate.  A few days ago she was apparently getting nocturnal tube feeds only.  Stools are not watery. Agree with hospitalist that a change in the tube feeding formula may help. She briefly had diarrhea last month and was C. difficile negative then.  However if stool frequency continues, we may need to recheck this.  *    Chronic Plavix.  *    Dilatation and protein calorie malnutrition.  *     Congestive heart failure.  05/30/2018 echocardiogram showed LVEF 60 -85%.  Grade 3 diastolic dysfunction.  Moderate mitral stenosis and mild regurgitation. S/P mitral valve repair.   PLAN   *Agree with asking RD if a tube feeding formulary change may help.   For now, given her need for nutritional supplement, would not reduce tube feed rate. If stools become watery or continue to be of increased frequency, will need to recheck for C. difficile.    Azucena Freed  06/14/2018, 10:24 AM Phone [phone]

## 2018-06-15 ENCOUNTER — Inpatient Hospital Stay (HOSPITAL_COMMUNITY): Payer: Medicare Other

## 2018-06-15 ENCOUNTER — Encounter (HOSPITAL_COMMUNITY): Payer: Self-pay | Admitting: Physician Assistant

## 2018-06-15 DIAGNOSIS — K920 Hematemesis: Secondary | ICD-10-CM

## 2018-06-15 LAB — HEMOGLOBIN AND HEMATOCRIT, BLOOD
HCT: 19.4 % — ABNORMAL LOW (ref 36.0–46.0)
HCT: 28 % — ABNORMAL LOW (ref 36.0–46.0)
HCT: 23.7 % — ABNORMAL LOW (ref 36.0–46.0)
Hemoglobin: 5.7 g/dL — CL (ref 12.0–15.0)
Hemoglobin: 8.9 g/dL — ABNORMAL LOW (ref 12.0–15.0)
Hemoglobin: 7.8 g/dL — ABNORMAL LOW (ref 12.0–15.0)

## 2018-06-15 LAB — GLUCOSE, CAPILLARY
GLUCOSE-CAPILLARY: 165 mg/dL — AB (ref 70–99)
GLUCOSE-CAPILLARY: 129 mg/dL — AB (ref 70–99)
Glucose-Capillary: 190 mg/dL — ABNORMAL HIGH (ref 70–99)
Glucose-Capillary: 173 mg/dL — ABNORMAL HIGH (ref 70–99)
Glucose-Capillary: 156 mg/dL — ABNORMAL HIGH (ref 70–99)
Glucose-Capillary: 141 mg/dL — ABNORMAL HIGH (ref 70–99)

## 2018-06-15 LAB — BASIC METABOLIC PANEL
ANION GAP: 10 (ref 5–15)
BUN: 78 mg/dL — ABNORMAL HIGH (ref 8–23)
CALCIUM: 8.6 mg/dL — AB (ref 8.9–10.3)
CO2: 32 mmol/L (ref 22–32)
Chloride: 100 mmol/L (ref 98–111)
Creatinine, Ser: 1.87 mg/dL — ABNORMAL HIGH (ref 0.44–1.00)
GFR calc Af Amer: 31 mL/min — ABNORMAL LOW (ref >60)
GFR calc non Af Amer: 27 mL/min — ABNORMAL LOW (ref >60)
Glucose, Bld: 191 mg/dL — ABNORMAL HIGH (ref 70–99)
Potassium: 5.2 mmol/L — ABNORMAL HIGH (ref 3.5–5.1)
Sodium: 142 mmol/L (ref 135–145)

## 2018-06-15 LAB — CBC
HEMATOCRIT: 20.5 % — AB (ref 36.0–46.0)
HEMOGLOBIN: 6.1 g/dL — AB (ref 12.0–15.0)
MCH: 30.5 pg (ref 26.0–34.0)
MCHC: 29.8 g/dL — ABNORMAL LOW (ref 30.0–36.0)
MCV: 102.5 fL — ABNORMAL HIGH (ref 80.0–100.0)
NRBC: 0 % (ref 0.0–0.2)
Platelets: 332 10*3/uL (ref 150–400)
RBC: 2 MIL/uL — ABNORMAL LOW (ref 3.87–5.11)
RDW: 18.6 % — AB (ref 11.5–15.5)
WBC: 11.7 10*3/uL — AB (ref 4.0–10.5)

## 2018-06-15 LAB — PREPARE RBC (CROSSMATCH)

## 2018-06-15 LAB — PROTIME-INR
INR: 1.28
Prothrombin Time: 15.9 seconds — ABNORMAL HIGH (ref 11.4–15.2)

## 2018-06-15 MED ORDER — HYDROMORPHONE HCL 1 MG/ML IJ SOLN
1.0000 mg | INTRAMUSCULAR | Status: DC | PRN
  Administered 2018-06-15: 1 mg via INTRAVENOUS
  Filled 2018-06-15: qty 1

## 2018-06-15 MED ORDER — HYDRALAZINE HCL 20 MG/ML IJ SOLN
10.0000 mg | INTRAMUSCULAR | Status: DC | PRN
  Administered 2018-06-15 – 2018-06-16 (×4): 10 mg via INTRAVENOUS
  Filled 2018-06-15 (×4): qty 1

## 2018-06-15 MED ORDER — HYDROMORPHONE HCL 1 MG/ML IJ SOLN
1.0000 mg | INTRAMUSCULAR | Status: DC | PRN
  Administered 2018-06-15 – 2018-06-18 (×9): 1 mg via INTRAVENOUS
  Filled 2018-06-15 (×9): qty 1

## 2018-06-15 MED ORDER — SODIUM CHLORIDE 0.9% IV SOLUTION
Freq: Once | INTRAVENOUS | Status: AC
  Administered 2018-06-15: 10:00:00 via INTRAVENOUS

## 2018-06-15 MED ORDER — ORAL CARE MOUTH RINSE
15.0000 mL | Freq: Two times a day (BID) | OROMUCOSAL | Status: DC
  Administered 2018-06-15 – 2018-06-25 (×14): 15 mL via OROMUCOSAL

## 2018-06-15 MED ORDER — PANTOPRAZOLE SODIUM 40 MG IV SOLR: 40.0000 mg | Freq: Two times a day (BID) | INTRAVENOUS | Status: DC

## 2018-06-15 MED ORDER — SODIUM CHLORIDE 0.9% IV SOLUTION: Freq: Once | INTRAVENOUS | Status: AC

## 2018-06-15 MED ORDER — SODIUM CHLORIDE 0.9 % IV SOLN
250.0000 mL | INTRAVENOUS | Status: DC
  Administered 2018-06-16: 250 mL via INTRAVENOUS

## 2018-06-15 MED ORDER — SODIUM CHLORIDE 0.9 % IV SOLN
80.0000 mg | Freq: Once | INTRAVENOUS | Status: AC
  Administered 2018-06-15: 80 mg via INTRAVENOUS
  Filled 2018-06-15: qty 80

## 2018-06-15 MED ORDER — PHENYLEPHRINE HCL-NACL 10-0.9 MG/250ML-% IV SOLN
25.0000 ug/min | INTRAVENOUS | Status: DC
  Filled 2018-06-15: qty 250

## 2018-06-15 MED ORDER — FUROSEMIDE 10 MG/ML IJ SOLN
40.0000 mg | Freq: Once | INTRAMUSCULAR | Status: AC
  Administered 2018-06-15: 40 mg via INTRAVENOUS
  Filled 2018-06-15: qty 4

## 2018-06-15 MED ORDER — METOCLOPRAMIDE HCL 5 MG/ML IJ SOLN
10.0000 mg | Freq: Once | INTRAMUSCULAR | Status: AC
  Administered 2018-06-15: 10 mg via INTRAVENOUS
  Filled 2018-06-15: qty 2

## 2018-06-15 MED ORDER — SODIUM CHLORIDE 0.9 % IV BOLUS
500.0000 mL | Freq: Once | INTRAVENOUS | Status: AC
  Administered 2018-06-15: 500 mL via INTRAVENOUS

## 2018-06-15 MED ORDER — HYDROMORPHONE HCL 1 MG/ML IJ SOLN
1.0000 mg | Freq: Once | INTRAMUSCULAR | Status: AC
  Administered 2018-06-15: 1 mg via INTRAVENOUS
  Filled 2018-06-15: qty 1

## 2018-06-15 MED ORDER — SODIUM CHLORIDE 0.9 % IV BOLUS
250.0000 mL | Freq: Once | INTRAVENOUS | Status: AC
  Administered 2018-06-15: 250 mL via INTRAVENOUS

## 2018-06-15 MED ORDER — SODIUM CHLORIDE 0.9 % IV SOLN: INTRAVENOUS | Status: DC | PRN

## 2018-06-15 MED ORDER — SODIUM CHLORIDE 0.9 % IV SOLN
8.0000 mg/h | INTRAVENOUS | Status: DC
  Administered 2018-06-15 – 2018-06-16 (×3): 8 mg/h via INTRAVENOUS
  Filled 2018-06-15 (×6): qty 80

## 2018-06-15 NOTE — H&P (View-Only) (Signed)
Daily Rounding Note  06/15/2018, 8:35 AM  LOS: 18 days   SUBJECTIVE:   Chief complaint: Hematemesis. This morning she had a couple of episodes of hematemesis.  Initially smaller amounts.  The second episode approximately 80 cc. IV Protonix drip initiated. No tachycardia, latest, lowest, BP 153/53. CBC had not been obtained this morning.  H&H now ordered.  Hospitalist also ordered transfusion with 1 unit of PRBCs given her trending anemia from yesterday's labs. No BMs.  OBJECTIVE:         Vital signs in last 24 hours:    Temp:  [98 F (36.7 C)-98.3 F (36.8 C)] 98.1 F (36.7 C) (10/10 0449) Pulse Rate:  [67-86] 86 (10/10 0449) Resp:  [18] 18 (10/10 0449) BP: (153-164)/(52-53) 153/53 (10/10 0449) SpO2:  [92 %-100 %] 98 % (10/10 0449) Last BM Date: 06/13/18 Filed Weights   06/10/18 2012 06/11/18 0500 06/12/18 2041  Weight: 67.1 kg 67.1 kg 67.2 kg   General: Looks acutely/chronically ill.  Looks about the same as yesterday. Heart: RRR. Chest: Clear.  Slight dyspnea. Abdomen: Soft.  Tender in the left upper quadrant and right mid abdomen.  No guarding or rebound.  Bowel sounds hypoactive. Extremities: Anasarca.  Bruising especially on the arms. Neuro/Psych: Alert.  Oriented x3.  Moves all 4 limbs.  Intake/Output from previous day: 10/09 0701 - 10/10 0700 In: 1118.3 [P.O.:460; NG/GT:658.3] Out: 1300 [Urine:1250; Emesis/NG output:50]  Intake/Output this shift: No intake/output data recorded.  Lab Results: Recent Labs    06/14/18 0818  WBC 15.0*  HGB 7.9*  HCT 26.2*  PLT 348   BMET Recent Labs    06/13/18 0526 06/14/18 0818 06/15/18 0456  NA 140 138 142  K 5.4* 4.7 5.2*  CL 103 103 100  CO2 24 28 32  GLUCOSE 82 163* 191*  BUN 57* 61* 78*  CREATININE 2.32* 2.02* 1.87*  CALCIUM 9.0 8.8* 8.6*   Scheduled Meds: . sodium chloride   Intravenous Once  . ALPRAZolam  0.5 mg Oral QHS  . carvedilol  12.5  mg Oral BID WC  . cholecalciferol  5,000 Units Oral Daily  . cloNIDine  0.1 mg Oral TID  . doxycycline  100 mg Per Tube Q12H  . DULoxetine  60 mg Oral Daily  . ferrous sulfate  325 mg Oral BID WC  . furosemide  40 mg Oral BID  . gabapentin  300 mg Oral BID  . insulin aspart  0-9 Units Subcutaneous Q4H  . isosorbide mononitrate  15 mg Oral Daily  . lipase/protease/amylase  12,000 Units Oral TID AC  . lipase/protease/amylase)  20,880 Units Per Tube Once   And  . sodium bicarbonate  650 mg Per Tube Once  . mirtazapine  7.5 mg Oral QHS  . mometasone-formoterol  2 puff Inhalation BID  . nicotine  21 mg Transdermal Daily  . [START ON 06/18/2018] pantoprazole  40 mg Intravenous Q12H  . vitamin B-12  100 mcg Per Tube Daily   Continuous Infusions: . pantoprazole (PROTONIX) IVPB 80 mg (06/15/18 0830)  . pantoprozole (PROTONIX) infusion     PRN Meds:.acetaminophen **OR** acetaminophen, alum & mag hydroxide-simeth, hydrALAZINE, HYDROcodone-acetaminophen, HYDROmorphone (DILAUDID) injection, hydrOXYzine, levalbuterol, ondansetron (ZOFRAN) IV, phenol, polyethylene glycol, zolpidem   ASSESMENT:   *    Hematemesis.  Acute onset this morning. EGD 8/7 to investigate unexplained iron deficiency anemia, maroon/FOBT positive stool, normal.  No biopsies obtained.  *   Severe, recurrent versus unresolved acute pancreatitis.  Necrosis and developing pseudocysts.  Very slow recovery inpatient who at baseline is severely debilitated.  *    Chronic Plavix.  For history peripheral vascular disease, coronary artery disease, last dose yesterday, now discontinued.  *    Cholecystectomy 04/19/2018 for suspected gallstone pancreatitis.  *    01/2018 AAA repair   PLAN   *   Give a dose of IV Reglan now.  Attempt to clear the gastric contents of blood as well as ease her nausea which has returned.  *    Consider upper endoscopy later today.    [name]  06/15/2018, 8:35 AM Phone [phone]

## 2018-06-15 NOTE — Progress Notes (Signed)
Patients BP on arrival to floor 79/41(49). Patient's BP is being taken on her left leg d/t to patient having a left upper arm fistula and two IVs infusing on the right arm. Patient's BP was reading as low as 46/12(15). Patient was alert & oriented X4 and talking with her daughters and myself at the bedside. Valetta Fuller, NP was notified. Arterial line was placed. Arterial line was reading 219/55(99). Patient's BP cuff was moved to the right lower forearm and BP was 206/70(106). PRN Hydralazine was ordered. Will continue to monitor patient.

## 2018-06-15 NOTE — Progress Notes (Signed)
PT Cancellation Note  Patient Details Name: [name] MRN: [id number] DOB: 06-27-1951   Cancelled Treatment:    Reason Eval/Treat Not Completed: (P) Medical issues which prohibited therapy Pt with  hematemesis and current Hgb 5.7. PT will follow back tomorrow to check for appropriateness of treatment.   Kenyatta Gloeckner B. Migdalia Dk PT, DPT Acute Rehabilitation Services Pager [phone] Office [phone]     [place] 06/15/2018, 10:54 AM

## 2018-06-15 NOTE — Progress Notes (Signed)
Tube feeding held at present. Not in distress. Will continue to monitor.

## 2018-06-15 NOTE — Progress Notes (Addendum)
PROGRESS NOTE    [name]  NIO:[id number] DOB: 11-Aug-1951 DOA: 05/28/2018 PCP: Bernerd Limbo, MD    Brief Narrative: 67 year old woman PMH gallstone pancreatitis August 2019, status post laparoscopic cholecystectomy same hospitalization, presented with abdominal pain, nausea, vomiting.  Admitted for recurrent pancreatitis.  She was seen by gastroenterology, cortrak tube was placed and she was started on enteral feeds.  Continuation has been very slow to improve and was complicated by a mild ileus, acute kidney injury and anasarca.  Plan at this point is to continue diuresis as tolerated, advance diet as per GI and general surgery, likely home, possibly with tube feeds, towards the end of the week  Assessment & Plan:   Principal Problem:   Pancreatitis, recurrent Active Problems:   TOBACCO ABUSE   Essential hypertension   Atrial fibrillation (HCC)   COPD (chronic obstructive pulmonary disease) (HCC)   S/P CABG x 3   Anemia   GERD (gastroesophageal reflux disease)   Hyperlipidemia   CAD (coronary artery disease)   Chronic kidney disease (CKD), stage IV (severe) (HCC)   Chronic diastolic congestive heart failure (HCC)   Type II diabetes mellitus with renal manifestations (HCC)   SIRS (systemic inflammatory response syndrome) (HCC)   Protein-calorie malnutrition, severe   Anasarca   Demand ischemia (HCC)   Hyperkalemia   AKI (acute kidney injury) ([place])   Goals of care, counseling/discussion   Palliative care by specialist  Addendum;  Hypovolemic Shock; GI bleed Repeated BP was 81/58 this am. Nurse called blood bank, will try to get blood transfusion emergent release.  Transfer to ICU, CCM consulted.  IV bolus started.   Acute pancreatitis, recurrent vs unresolved. Developing pseudocyst.  Has tube feeding, below ligament of treitz.  S/P cholecystectomy 8-14 Management per GI.   Upper GI bleed;  Vomited bright red blood. Type and screen.  Will transfuse one unit, Hb  yesterday 7.9. Start Protonix Gtt.  I have inform GI. Will order KUB Transfer to step down.   Mild Hyperkalemia;  On oral lasix.   Severe protein caloric malnutrition;  On tube feeding. Holding tube feeding due to GI bleed.  Develops diarrhea, will ask nutritionist to change formula.    Mild ileus; resolved. Sx was following.  Repeat KUB.   AKI on CKD stage IV;  Stable. Received IV lasix.  Lasix was change to oral.  Monitor renal function.   Anasarca secondary to hypoalbuminemia, aggressive volume resuscitation Received IV lasix. Now on oral lasix.   Right upper extremity with worsening edema, redness, Cellulitis Repeated  Doppler showed superficial cephalic vein thrombosis. Local care. . Started doxy to treat for cellulitis 10-09.   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  Colonoscopy planned October 28.  Was not iron deficient August. Vomited this am.  Hb pending.  Will give one unit PRBC>   A fib; stable.   COPD, chronic hypoxic respiratory failure on oxygen at home --Remains stable.  Demand ischemia.  PMH CAD, CABG. --Cardiology recommended medical management, consider outpatient stress test.    DVT prophylaxis: heparin  Code Status: full code.  Family Communication: care discussed with patient.  Disposition Plan: develops GI bleed today, start Protonix gtt, transfusion.   Consultants:   Sx  GI  Nephrology    Procedures:  cortrak feeding tube placement on 9/23  Transfusion 1 unit PRBC Echo; Normal LVF with mild BSH, mild MR and TR, mild LVE, grade 3 diastolic dysfunction.. S/P mitral valve repair with moderate MS. No  significant change from prior echo The right ventricular systolic pressure was increased consistent with mild pulmonary  hypertension.   Antimicrobials: merrem.    Subjective: Vomited twice today. Second time more than 100 cc.  Mild abdominal pain.    Objective: Vitals:   06/14/18 1618  06/14/18 1945 06/14/18 2055 06/15/18 0449  BP:   (!) 164/52 (!) 153/53  Pulse: 67  80 86  Resp:   18 18  Temp: 98 F (36.7 C)  98.3 F (36.8 C) 98.1 F (36.7 C)  TempSrc: Oral  Oral Oral  SpO2: 100% 98% 98% 98%  Weight:      Height:        Intake/Output Summary (Last 24 hours) at 06/15/2018 0750 Last data filed at 06/15/2018 0645 Gross per 24 hour  Intake 1118.33 ml  Output 1300 ml  Net -181.67 ml   Filed Weights   06/10/18 2012 06/11/18 0500 06/12/18 2041  Weight: 67.1 kg 67.1 kg 67.2 kg    Examination:  General exam: alert. No acute distress.  Respiratory system: CTA Cardiovascular system: S 1, S 2 RRR Gastrointestinal system: BS present, soft, mild tenderness Central nervous system: non focal.  Extremities:  Symmetric power.  Skin: No rashes.  Psychiatry: Mood and affect appropriate.   Data Reviewed: I have personally reviewed following labs and imaging studies  CBC: Recent Labs  Lab 06/09/18 0708 06/12/18 0422 06/14/18 0818  WBC 13.0* 13.5* 15.0*  HGB 8.3* 8.5* 7.9*  HCT 26.6* 27.6* 26.2*  MCV 100.8* 101.1* 100.4*  PLT 357 352 518   Basic Metabolic Panel: Recent Labs  Lab 06/10/18 0628 06/11/18 0444 06/12/18 0422 06/13/18 0526 06/14/18 0818 06/15/18 0456  NA 138 137 139 140 138 142  K 5.1 5.4* 5.4* 5.4* 4.7 5.2*  CL 96* 96* 100 103 103 100  CO2 29 30 28 24 28  32  GLUCOSE 123* 155* 84 82 163* 191*  BUN 62* 65* 59* 57* 61* 78*  CREATININE 2.63* 2.47* 2.28* 2.32* 2.02* 1.87*  CALCIUM 9.3 8.8* 9.1 9.0 8.8* 8.6*  MG  --   --  1.9 2.1  --   --   PHOS 6.5*  --  6.0* 5.6*  --   --    GFR: Estimated Creatinine Clearance: 27 mL/min (A) (by C-G formula based on SCr of 1.87 mg/dL (H)). Liver Function Tests: Recent Labs  Lab 06/10/18 0628 06/12/18 0422  AST  --  19  ALT  --  9  ALKPHOS  --  35*  BILITOT  --  1.0  PROT  --  5.2*  ALBUMIN 2.0* 2.1*   Recent Labs  Lab 06/09/18 0708 06/10/18 0628 06/11/18 0444 06/12/18 0422  LIPASE 179*  205* 1,307* 140*   No results for input(s): AMMONIA in the last 168 hours. Coagulation Profile: No results for input(s): INR, PROTIME in the last 168 hours. Cardiac Enzymes: No results for input(s): CKTOTAL, CKMB, CKMBINDEX, TROPONINI in the last 168 hours. BNP (last 3 results) No results for input(s): PROBNP in the last 8760 hours. HbA1C: No results for input(s): HGBA1C in the last 72 hours. CBG: Recent Labs  Lab 06/14/18 1152 06/14/18 1616 06/14/18 2052 06/14/18 2353 06/15/18 0446  GLUCAP 167* 114* 184* 167* 190*   Lipid Profile: No results for input(s): CHOL, HDL, LDLCALC, TRIG, CHOLHDL, LDLDIRECT in the last 72 hours. Thyroid Function Tests: No results for input(s): TSH, T4TOTAL, FREET4, T3FREE, THYROIDAB in the last 72 hours. Anemia Panel: No results for input(s): VITAMINB12, FOLATE, FERRITIN, TIBC, IRON,  RETICCTPCT in the last 72 hours. Sepsis Labs: No results for input(s): PROCALCITON, LATICACIDVEN in the last 168 hours.  Recent Results (from the past 240 hour(s))  Culture, blood (routine x 2)     Status: None   Collection Time: 06/05/18  2:45 PM  Result Value Ref Range Status   Specimen Description BLOOD RIGHT HAND  Final   Special Requests   Final    BOTTLES DRAWN AEROBIC ONLY Blood Culture adequate volume   Culture   Final    NO GROWTH 5 DAYS Performed at [facility], [address], [place], [place] [address]    Report Status 06/10/2018 FINAL  Final  Culture, blood (routine x 2)     Status: None   Collection Time: 06/05/18  2:53 PM  Result Value Ref Range Status   Specimen Description BLOOD RIGHT HAND  Final   Special Requests   Final    BOTTLES DRAWN AEROBIC ONLY Blood Culture adequate volume   Culture   Final    NO GROWTH 5 DAYS Performed at [facility], [place] [address] [place], [place] [address]    Report Status 06/10/2018 FINAL  Final         Radiology Studies: No results found.      Scheduled Meds: . sodium chloride    Intravenous Once  . ALPRAZolam  0.5 mg Oral QHS  . carvedilol  12.5 mg Oral BID WC  . cholecalciferol  5,000 Units Oral Daily  . cloNIDine  0.1 mg Oral TID  . doxycycline  100 mg Per Tube Q12H  . DULoxetine  60 mg Oral Daily  . ferrous sulfate  325 mg Oral BID WC  . furosemide  40 mg Oral BID  . gabapentin  300 mg Oral BID  . hydrALAZINE  100 mg Oral TID  . insulin aspart  0-9 Units Subcutaneous Q4H  . isosorbide mononitrate  15 mg Oral Daily  . lipase/protease/amylase  12,000 Units Oral TID AC  . lipase/protease/amylase)  20,880 Units Per Tube Once   And  . sodium bicarbonate  650 mg Per Tube Once  . mirtazapine  7.5 mg Oral QHS  . mometasone-formoterol  2 puff Inhalation BID  . nicotine  21 mg Transdermal Daily  . [START ON 06/18/2018] pantoprazole  40 mg Intravenous Q12H  . vitamin B-12  100 mcg Per Tube Daily   Continuous Infusions: . feeding supplement (VITAL 1.5 CAL) Stopped (06/15/18 0520)  . pantoprazole (PROTONIX) IVPB    . pantoprozole (PROTONIX) infusion       LOS: 18 days    Time spent: 35 minutes./     Elmarie Shiley, MD [facility] Pager [phone]  If 7PM-7AM, please contact night-coverage [email or web address] Password [email or web address] 06/15/2018, 7:50 AM

## 2018-06-15 NOTE — Progress Notes (Signed)
Patient vomited approximately 50 cc of  Dark red secretions. Zofran given given will continue to monitor.

## 2018-06-15 NOTE — Progress Notes (Signed)
CC:  Abdominal pain  Subjective: GI bleed with hematemesis this AM, now in ICU getting blood and IV PPI.  Pain ongoing Right side.    Objective: Vital signs in last 24 hours: Temp:  [97.6 F (36.4 C)-98.3 F (36.8 C)] 97.8 F (36.6 C) (10/10 1011) Pulse Rate:  [67-86] 81 (10/10 1011) Resp:  [18] 18 (10/10 1011) BP: (78-164)/(51-70) 78/57 (10/10 1011) SpO2:  [96 %-100 %] 100 % (10/10 0851) Last BM Date: 06/13/18 460 PO 658 IV 1250 urine Emesis 50 No BM recorded Afebrile, BP down to 78/57 Creatinine improving 2.28 >> 1.87 H/H 8.5/27.6 >> 5.7/19.4 INR 1.28 Glucose 160's - 190's   Intake/Output from previous day: 10/09 0701 - 10/10 0700 In: 1118.3 [P.O.:460; NG/GT:658.3] Out: 1300 [Urine:1250; Emesis/NG output:50] Intake/Output this shift: Total I/O In: 280 [I.V.:250; Blood:30] Out: -   General appearance: alert, cooperative and mild distress GI: soft tender, pain worst RUQ, and right mid abdomen.  Few BS.   Lab Results:  Recent Labs    06/14/18 0818 06/15/18 0456 06/15/18 0837  WBC 15.0* 11.7*  --   HGB 7.9* 6.1* 5.7*  HCT 26.2* 20.5* 19.4*  PLT 348 332  --     BMET Recent Labs    06/14/18 0818 06/15/18 0456  NA 138 142  K 4.7 5.2*  CL 103 100  CO2 28 32  GLUCOSE 163* 191*  BUN 61* 78*  CREATININE 2.02* 1.87*  CALCIUM 8.8* 8.6*   PT/INR Recent Labs    06/15/18 0837  LABPROT 15.9*  INR 1.28    Recent Labs  Lab 06/10/18 0628 06/12/18 0422  AST  --  19  ALT  --  9  ALKPHOS  --  35*  BILITOT  --  1.0  PROT  --  5.2*  ALBUMIN 2.0* 2.1*     Lipase     Component Value Date/Time   LIPASE 140 (H) 06/12/2018 0422     Medications: . ALPRAZolam  0.5 mg Oral QHS  . cholecalciferol  5,000 Units Oral Daily  . doxycycline  100 mg Per Tube Q12H  . DULoxetine  60 mg Oral Daily  . gabapentin  300 mg Oral BID  . insulin aspart  0-9 Units Subcutaneous Q4H  . lipase/protease/amylase  12,000 Units Oral TID AC  . lipase/protease/amylase)   20,880 Units Per Tube Once   And  . sodium bicarbonate  650 mg Per Tube Once  . mirtazapine  7.5 mg Oral QHS  . mometasone-formoterol  2 puff Inhalation BID  . nicotine  21 mg Transdermal Daily  . [START ON 06/18/2018] pantoprazole  40 mg Intravenous Q12H  . vitamin B-12  100 mcg Per Tube Daily   . pantoprozole (PROTONIX) infusion 8 mg/hr (06/15/18 0856)   Anti-infectives (From admission, onward)   Start     Dose/Rate Route Frequency Ordered Stop   06/14/18 1030  doxycycline (VIBRAMYCIN) 50 MG/5ML syrup 100 mg     100 mg Per Tube Every 12 hours 06/14/18 1028 06/16/18 2159   06/05/18 1445  meropenem (MERREM) 1 g in sodium chloride 0.9 % 100 mL IVPB     1 g 200 mL/hr over 30 Minutes Intravenous Every 12 hours 06/05/18 1433 06/10/18 1019   05/29/18 0600  piperacillin-tazobactam (ZOSYN) IVPB 3.375 g     3.375 g 12.5 mL/hr over 240 Minutes Intravenous Every 8 hours 05/28/18 2227 06/05/18 1600   05/28/18 2230  piperacillin-tazobactam (ZOSYN) IVPB 3.375 g     3.375 g 100  mL/hr over 30 Minutes Intravenous  Once 05/28/18 2227 05/29/18 0011      Assessment/Plan New Hematemesis this AM - tx to SDU AKI on CKD Anasarca  malnutrition Chronic diastolic HF Acute on chronic anemia  A.fib COPD on home O2 Diarrhea  DM2  Atherosclerosis  Ileus, likely secondary to severe necrotizing pancreatitis with pseudocyst, history of gallstone pancreatitis, s/p lap chole on 04-19-18 - afebrile, VSS - post-pyloric PANDA placed 10/4, tolerating TFs - lipase trending down  FEN: TF @ goal 60 cc/hr, CLD okay ID: Zosyn 9/22-9/30, Merrem 9/30-10/5; Doxycycline 10/9 -05/17/18 VTE: SCD's Follow up: TBD   Plan: Most likely going for EGD later today.  Will follow.   LOS: 18 days    Christabelle Hanzlik 06/15/2018 [phone]

## 2018-06-15 NOTE — Consult Note (Signed)
NAME:  [name], MRN:  [id number], DOB:  04/01/1951, LOS: 60 ADMISSION DATE:  05/28/2018, CONSULTATION DATE:  10/10 REFERRING MD:  Tyrell Antonio, CHIEF COMPLAINT:  Hypotension, hematemesis    Brief History   67 year old female with complex medical history including hypertension, hyperlipidemia, diabetes, COPD on home O2, A. fib, CKD 4, mitral valve regurg, CAD status post CABG, CHF with previous GI bleeding with recent admit in August due to GI bleed and gallstone pancreatitis.  She was readmitted 9/22 with recurrent versus smoldering pancreatitis with CT scan concerning for pseudocyst and necrosis.  She has been managed medically with IV fluids and enteral tube feeding.  Course has also been complicated by AKI on CKD 4 of unclear etiology and anasarca. On 10/10 she developed hematemesis.  She had 2 episodes, one episode with approximately 80 cc of bright red blood and some blood clots per RN.  Previously on subcu heparin for DVT prophylaxis and WAS on Plavix for PVD, CAD.  Last dose 10/9.  Past Medical History  Pretension, hyperlipidemia, diabetes, COPD on home O2, GERD, gout, depression, anxiety, A. fib not on anticoagulation, AAA, CAD status post CABG, CHF, CKD 4-5, mitral valve regurgitation, PAD Significant Hospital Events     Consults: date of consult/date signed off & final recs:  Renal 10/4>>> Palliative care 10/4>>>  PCCM 10/10>>> GI 10/10>>>   Procedures (surgical and bedside):    Significant Diagnostic Tests:  Right upper extremity venous Doppler 10/9 >>negative for DVT, acute superficial right cephalic vein thrombosis  Micro Data:  BC x 2 9/22>>> NEG CDiff 9/25>>> neg  BC x 2 9/30>>> NEG    Antimicrobials:  ID: Zosyn 9/22-9/30,  Merrem 9/30-10/5  Doxycycline 10/9>>>  Subjective:  C/o abd pain   Objective   Blood pressure (!) 78/57, pulse 81, temperature 97.8 F (36.6 C), temperature source Oral, resp. rate 18, height 5\' 5"  (1.651 m), weight 67.2 kg, SpO2 100 %.        Intake/Output Summary (Last 24 hours) at 06/15/2018 1105 Last data filed at 06/15/2018 0956 Gross per 24 hour  Intake 1178.33 ml  Output 1000 ml  Net 178.33 ml   Filed Weights   06/10/18 2012 06/11/18 0500 06/12/18 2041  Weight: 67.1 kg 67.1 kg 67.2 kg    Examination: General: Chronically ill-appearing female, uncomfortable appearing but no acute distress HENT: His membranes moist, relatively pale, right nare feeding tube Lungs: Respirations even nonlabored on nasal cannula, diminished bases otherwise clear Cardiovascular: S1-S2 regular rate and rhythm, sinus rhythm 86 Abdomen: Soft, mildly tender diffusely, hypoactive bowel sounds Extremities: Warm and dry, 1-2+ BLE edema Neuro: Awake, alert, slow to respond but overall oriented   Resolved Hospital Problem list     Assessment & Plan:  Hematemesis-in setting of Plavix Upper GI bleeding-had EGD in August 2019 related to iron deficiency anemia.   Plan- GI following Continue Protonix drip 2 units PRBCs pending Reglan given by GI Holding Plavix  Hypotension Hemorrhagic shock-related to upper GI bleed Plan- Gentle IV fluids 2 units PRBCs as above ICU monitoring  Severe recurrent versus unresolved acute pancreatitis with necrosis and pseudocyst History of gallstone pancreatitis status post lap chole on 04/19/2018 Mild ileus Plan- Surgery following Holding tube feeds Trend lipase  COPD Chronic hypoxic respiratory failure-on home O2 Plan- Supplemental O2 as needed Continue Dulera and as needed Xopenex  chronic anemia Blood loss anemia Plan- PRBCs as above Trend CBC every 6 hours Hold Plavix  Acute on CKD 4-5 Plan- Renal following Follow-up chemistry  Hold Lasix for now with hypotension Monitor urine output-had been diuresing well with Lasix over last few days  Anasarca-related to hypoalbuminemia and aggressive volume resuscitation.   Plan- Holding oral Lasix for now Renal following  Right  upper extremity cellulitis Right upper extremity superficial cephalic vein thrombosis Plan- Continue doxycycline Supportive care  Disposition / Summary of Today's Plan 06/15/18   Monitor in ICU We will have RT attempt radial A-line for more accurate blood pressure monitoring Can start low-dose peripheral neo-if needed although suspect her BP will improve with fluids and blood Follow-up hemoglobin after first unit PRBC GI following likely needs EGD today    Diet: NPO  Pain/Anxiety/Delirium protocol (if indicated): n/a VAP protocol (if indicated): n/a  DVT prophylaxis: SCD GI prophylaxis: PPI Hyperglycemia protocol: n/a  Mobility: bedrest  Code Status: full  Family Communication: no family at bedside on NP rounds 10/10  Labs   CBC: Recent Labs  Lab 06/09/18 0708 06/12/18 0422 06/14/18 0818 06/15/18 0456 06/15/18 0837  WBC 13.0* 13.5* 15.0* 11.7*  --   HGB 8.3* 8.5* 7.9* 6.1* 5.7*  HCT 26.6* 27.6* 26.2* 20.5* 19.4*  MCV 100.8* 101.1* 100.4* 102.5*  --   PLT 357 352 348 332  --     Basic Metabolic Panel: Recent Labs  Lab 06/10/18 0628 06/11/18 0444 06/12/18 0422 06/13/18 0526 06/14/18 0818 06/15/18 0456  NA 138 137 139 140 138 142  K 5.1 5.4* 5.4* 5.4* 4.7 5.2*  CL 96* 96* 100 103 103 100  CO2 29 30 28 24 28  32  GLUCOSE 123* 155* 84 82 163* 191*  BUN 62* 65* 59* 57* 61* 78*  CREATININE 2.63* 2.47* 2.28* 2.32* 2.02* 1.87*  CALCIUM 9.3 8.8* 9.1 9.0 8.8* 8.6*  MG  --   --  1.9 2.1  --   --   PHOS 6.5*  --  6.0* 5.6*  --   --    GFR: Estimated Creatinine Clearance: 27 mL/min (A) (by C-G formula based on SCr of 1.87 mg/dL (H)). Recent Labs  Lab 06/09/18 0708 06/12/18 0422 06/14/18 0818 06/15/18 0456  WBC 13.0* 13.5* 15.0* 11.7*    Liver Function Tests: Recent Labs  Lab 06/10/18 0628 06/12/18 0422  AST  --  19  ALT  --  9  ALKPHOS  --  35*  BILITOT  --  1.0  PROT  --  5.2*  ALBUMIN 2.0* 2.1*   Recent Labs  Lab 06/09/18 0708 06/10/18 0628  06/11/18 0444 06/12/18 0422  LIPASE 179* 205* 1,307* 140*   No results for input(s): AMMONIA in the last 168 hours.  ABG    Component Value Date/Time   PHART 7.418 01/17/2018 1933   PCO2ART 29.3 (L) 01/17/2018 1933   PO2ART 96.0 01/17/2018 1933   HCO3 18.9 (L) 01/17/2018 1933   TCO2 30 03/21/2018 0630   ACIDBASEDEF 5.0 (H) 01/17/2018 1933   O2SAT 98.0 01/17/2018 1933     Coagulation Profile: Recent Labs  Lab 06/15/18 0837  INR 1.28    Cardiac Enzymes: No results for input(s): CKTOTAL, CKMB, CKMBINDEX, TROPONINI in the last 168 hours.  HbA1C: Hgb A1c MFr Bld  Date/Time Value Ref Range Status  05/29/2018 03:35 AM 5.3 4.8 - 5.6 % Final    Comment:    (NOTE) Pre diabetes:          5.7%-6.4% Diabetes:              >6.4% Glycemic control for   <7.0% adults with diabetes  01/22/2018 07:02 AM 5.2 4.8 - 5.6 % Final    Comment:    (NOTE) Pre diabetes:          5.7%-6.4% Diabetes:              >6.4% Glycemic control for   <7.0% adults with diabetes     CBG: Recent Labs  Lab 06/14/18 1616 06/14/18 2052 06/14/18 2353 06/15/18 0446 06/15/18 0752  GLUCAP 114* 184* 167* 190* 165*     Review of Systems:   As per HPI - All other systems reviewed and were neg.    Past Medical History  She,  has a past medical history of AAA (abdominal aortic aneurysm) ([place]) (5/09), Abnormal EKG, Anemia, CAD (coronary artery disease), carotid stenosis (5/09), CHF (congestive heart failure) ([place]), Chronic back pain, Chronic bronchitis ([place]), CKD (chronic kidney disease), Complication of anesthesia (1966), COPD (chronic obstructive pulmonary disease) ([place]), Depression, GERD (gastroesophageal reflux disease), GIB (gastrointestinal bleeding) (9/11), Heart murmur, History of blood transfusion ("a few times"), History of hiatal hernia, Hyperlipidemia, Hypertension, Mitral regurgitation, Myocardial infarction ([place]) (2012 "several"), On home oxygen therapy, PAD (peripheral artery disease)  ([place]), Pancreatitis (05/2018), Paroxysmal atrial fibrillation ([place]), Subclavian artery stenosis, left ([place]), and Type II diabetes mellitus ([place]).   Surgical History    Past Surgical History:  Procedure Laterality Date  . A/V FISTULAGRAM Left 03/21/2018   Procedure: A/V FISTULAGRAM;  Surgeon: Serafina Mitchell, MD;  Location: [facility];  Service: Cardiovascular;  Laterality: Left;  . ABDOMINAL AORTIC ENDOVASCULAR STENT GRAFT N/A 01/17/2018   Procedure: ABDOMINAL AORTIC ENDOVASCULAR STENT GRAFT WITH CO2;  Surgeon: Serafina Mitchell, MD;  Location: [name];  Service: Vascular;  Laterality: N/A;  . ABDOMINAL HYSTERECTOMY  1990's  . APPENDECTOMY  Aug. 11, 2016   Ruptured  . AV FISTULA PLACEMENT Left 11/03/2017   Procedure: ARTERIOVENOUS (AV) FISTULA CREATION LEFT ARM;  Surgeon: Serafina Mitchell, MD;  Location: [facility];  Service: Vascular;  Laterality: Left;  . BOWEL RESECTION  2013  . CARDIAC CATHETERIZATION N/A 06/24/2015   Procedure: Left Heart Cath and Cors/Grafts Angiography;  Surgeon: Leonie Man, MD;  Location: [facility];  Service: Cardiovascular;  Laterality: N/A;  . CARDIAC CATHETERIZATION N/A 06/25/2015   Procedure: Coronary Stent Intervention;  Surgeon: Sherren Mocha, MD;  Location: [facility];  Service: Cardiovascular;  Laterality: N/A;  . CAROTID ENDARTERECTOMY Left   . CATARACT EXTRACTION Right 03/27/2018  . CHOLECYSTECTOMY N/A 04/19/2018   Procedure: LAPAROSCOPIC CHOLECYSTECTOMY WITH INTRAOPERATIVE CHOLANGIOGRAM;  Surgeon: Rolm Bookbinder, MD;  Location: [facility];  Service: General;  Laterality: N/A;  . CORONARY ANGIOPLASTY    . CORONARY ARTERY BYPASS GRAFT  03/05/2011   CABG X3 (LIMA to LAD, SVG to OM, SVG to PDA, EVH via left thigh  . CORONARY STENT PLACEMENT    . ESOPHAGOGASTRODUODENOSCOPY N/A 04/12/2018   Procedure: ESOPHAGOGASTRODUODENOSCOPY (EGD);  Surgeon: [name], MD;  Location[facility];  Service: Endoscopy;  Laterality: N/A;  . LACERATION  REPAIR Right 1990's   WRIST  . MAZE  03/05/2011   complete biatrial lesion set with clipping of LA appendage  . MITRAL VALVE REPAIR  03/05/2011   79mm Memo 3D ring annuloplasty for ischemic MR  . PERIPHERAL VASCULAR BALLOON ANGIOPLASTY Left 03/21/2018   Procedure: PERIPHERAL VASCULAR BALLOON ANGIOPLASTY;  Surgeon: Serafina Mitchell, MD;  Location: [facility];  Service: Cardiovascular;  Laterality: Left;  . PERIPHERAL VASCULAR CATHETERIZATION N/A 06/24/2015   Procedure: Aortic Arch Angiography;  Surgeon: Serafina Mitchell, MD;  Location: [facility];  Service: Cardiovascular;  Laterality: N/A;  . PERIPHERAL VASCULAR CATHETERIZATION  06/24/2015   Procedure: Peripheral Vascular Intervention;  Surgeon: Serafina Mitchell, MD;  Location: [facility];  Service: Cardiovascular;;  . PERIPHERAL VASCULAR CATHETERIZATION N/A 06/24/2015   Procedure: Abdominal Aortogram;  Surgeon: Serafina Mitchell, MD;  Location: [facility];  Service: Cardiovascular;  Laterality: N/A;  . RENAL ANGIOGRAM Bilateral 09/11/2013   Procedure: RENAL ANGIOGRAM;  Surgeon: Serafina Mitchell, MD;  Location[facility] LAB;  Service: Cardiovascular;  Laterality: Bilateral;  . RIGHT FEMORAL-POPLITEAL BYPASS       Social History   Social History   Socioeconomic History  . Marital status: Widowed    Spouse name: Not on file  . Number of children: Not on file  . Years of education: Not on file  . Highest education level: Not on file  Occupational History  . Occupation: Scientist, water quality in past    Employer: DISABLED  Social Needs  . Financial resource strain: Not on file  . Food insecurity:    Worry: Not on file    Inability: Not on file  . Transportation needs:    Medical: Not on file    Non-medical: Not on file  Tobacco Use  . Smoking status: Current Every Day Smoker    Years: 50.00    Types: Cigarettes  . Smokeless tobacco: Never Used  . Tobacco comment: 3/4 pk per day  Substance and Sexual Activity  .  Alcohol use: Yes    Alcohol/week: 0.0 standard drinks    Comment: occasionally  . Drug use: Yes    Types: Marijuana  . Sexual activity: Not on file  Lifestyle  . Physical activity:    Days per week: Not on file    Minutes per session: Not on file  . Stress: Not on file  Relationships  . Social connections:    Talks on phone: Not on file    Gets together: Not on file    Attends religious service: Not on file    Active member of club or organization: Not on file    Attends meetings of clubs or organizations: Not on file    Relationship status: Not on file  . Intimate partner violence:    Fear of current or ex partner: Not on file    Emotionally abused: Not on file    Physically abused: Not on file    Forced sexual activity: Not on file  Other Topics Concern  . Not on file  Social History Narrative   Widowed in 2009.  ,  reports that she has been smoking cigarettes. She has smoked for the past 50.00 years. She has never used smokeless tobacco. She reports that she drinks alcohol. She reports that she has current or past drug history. Drug: Marijuana.   Family History   Her family history includes AAA (abdominal aortic aneurysm) in her mother; Diabetes in her brother; Emphysema in her father and mother; Heart attack in her brother and mother; Heart attack (age of onset: 31) in her father; Heart disease in her brother, father, mother, and other; Hyperlipidemia in her brother, father, and mother; Hypertension in her brother, father, and mother; Peripheral vascular disease in her father. There is no history of Colon cancer.   Allergies Allergies  Allergen Reactions  . Isosorbide Other (See Comments)    Can only tolerate in low doses  . Isosorbide Nitrate Other (See  Comments)    Can only tolerate in low doses  . Oxycodone-Acetaminophen Itching  . Codeine Itching  . Contrast Media [Iodinated Diagnostic Agents] Itching and Other (See Comments)    Itching of feet  . Ioxaglate Itching  and Other (See Comments)    Itching of feet  . Metrizamide Itching and Other (See Comments)    Itching of feet  . Percocet [Oxycodone-Acetaminophen] Itching and Other (See Comments)    Tolerates Hydrocodone     Home Medications  Prior to Admission medications   Medication Sig Start Date End Date Taking? Authorizing Provider  acetaminophen (TYLENOL) 325 MG tablet Take 325 mg by mouth every 6 (six) hours as needed (for pain).   Yes [provider]  albuterol (PROVENTIL HFA;VENTOLIN HFA) 108 (90 Base) MCG/ACT inhaler Inhale 2 puffs into the lungs every 4 (four) hours as needed for wheezing or shortness of breath. ProAir Patient taking differently: Inhale 2 puffs into the lungs every 4 (four) hours as needed for wheezing or shortness of breath.  10/08/15  Yes Delfina Redwood, MD  allopurinol (ZYLOPRIM) 100 MG tablet Take 100 mg by mouth daily. 02/02/17  Yes [provider]  ALPRAZolam Duanne Moron) 0.5 MG tablet Take 0.5 mg by mouth at bedtime.    Yes [provider]  amLODipine (NORVASC) 10 MG tablet Take 10 mg by mouth daily.     Yes [provider]  atorvastatin (LIPITOR) 40 MG tablet Take 40 mg by mouth every evening.  12/07/17  Yes [provider]  budesonide-formoterol (SYMBICORT) 160-4.5 MCG/ACT inhaler Inhale 2 puffs into the lungs 2 (two) times daily as needed (for flares).   Yes [provider]  carvedilol (COREG) 25 MG tablet TAKE 1 TABLET BY MOUTH 2 TIMES A DAY WITH A MEAL Patient taking differently: Take 25 mg by mouth 2 (two) times daily with a meal.  11/04/15  Yes Burtis Junes, NP  Cholecalciferol (VITAMIN D3) 5000 units TABS Take 5,000 Units by mouth daily.   Yes [provider]  cloNIDine (CATAPRES) 0.2 MG tablet Take 1 tablet (0.2 mg total) by mouth 3 (three) times daily. 07/10/15  Yes Richardson Dopp T, PA-C  clopidogrel (PLAVIX) 75 MG tablet Take 1 tablet (75 mg total) by mouth daily with breakfast. 06/26/15  Yes Almyra Deforest, PA  docusate sodium (COLACE) 100 MG capsule Take 200 mg by mouth daily as needed (when taking pain medications).    Yes [provider]  DULoxetine (CYMBALTA) 60 MG capsule Take 60 mg by mouth daily.  10/26/17  Yes [provider]  famotidine (PEPCID) 20 MG tablet TAKE 1 TABLET (20 MG TOTAL) BY MOUTH DAILY BEFORE SUPPER. Patient taking differently: Take 20 mg by mouth daily before supper.  01/27/16  Yes Sherren Mocha, MD  fenofibrate 160 MG tablet Take 1 tablet (160 mg total) by mouth daily. Patient taking differently: Take 160 mg by mouth at bedtime.  10/08/15  Yes Delfina Redwood, MD  ferrous sulfate 325 (65 FE) MG tablet Take 325 mg by mouth 2 (two) times daily with a meal.   Yes [provider]  furosemide (LASIX) 40 MG tablet Take 40 mg by mouth See admin instructions. Take 40 mg by mouth once a day and may take an additional 40 mg day as needed for weight gain 05/30/17  Yes [provider]  gabapentin (NEURONTIN) 300 MG capsule TAKE 1 CAPSULE(300 MG) BY MOUTH TWICE DAILY Patient taking differently: Take 300 mg by mouth 2 (two)  times daily.  05/05/18  Yes Serafina Mitchell, MD  hydrALAZINE (APRESOLINE) 100 MG tablet Take 100 mg by mouth 3 (three) times daily.   Yes [provider]  HYDROcodone-acetaminophen (NORCO/VICODIN) 5-325 MG tablet Take 1 tablet by mouth every 6 (six) hours as needed for moderate pain. 04/20/18  Yes Rolm Bookbinder, MD  isosorbide mononitrate (IMDUR) 30 MG 24 hr tablet Take 0.5 tablets (15 mg total) by mouth daily. 12/02/15  Yes Sherren Mocha, MD  mirtazapine (REMERON) 7.5 MG tablet Take 7.5 mg by mouth at bedtime.  02/15/18 08/14/18 Yes [provider]  pantoprazole (PROTONIX) 40 MG tablet Take 1 tablet (40 mg total) by mouth daily. 06/26/15  Yes Almyra Deforest, PA  Potassium 99 MG TABS Take 99 mg by mouth daily.    Yes [provider]  promethazine (PHENERGAN) 25 MG tablet Take 1 tablet (25 mg total) by  mouth every 6 (six) hours as needed for nausea or vomiting. 05/16/18  Yes Ward, Delice Bison, DO         Nickolas Madrid, NP 06/15/2018  11:05 AM Pager: [phone] or [phone]

## 2018-06-15 NOTE — Progress Notes (Signed)
Patient receiving unit 2/2 of PRBC's. Upon assessment of unit of PRBC there was a tiny hole in the bag that was leaking a small amount of blood. Transfusion was stopped immediatly and returned to blood bank. Patient had received approximately 70cc of that unit of PRBCs. Patient did not have any reaction and VS's were WNL's. Dr Lynetta Mare was notified. Second unit of PRBCs is infusing at this time. Will continue to monitor.

## 2018-06-15 NOTE — Progress Notes (Signed)
Patient's cortrak bridle was found to be pushed back into patients nare and was causing patient pain in her nose. Attempted to reposition the bridle and cortrak into place but was unable to do so because cortrak would retract with inhalation. Heather, dietician was notified and she came to assess patient and was unable to reposition cortrak and unable to be removed d/t resistance being met. Dr Donne Hazel was also at the bedside and was unable to remove cortrak at this time. Ordered to leave cortrak in place at this time. Will continue to monitor patient.

## 2018-06-15 NOTE — Progress Notes (Addendum)
Daily Rounding Note  06/15/2018, 8:35 AM  LOS: 18 days   SUBJECTIVE:   Chief complaint: Hematemesis. This morning she had a couple of episodes of hematemesis.  Initially smaller amounts.  The second episode approximately 80 cc. IV Protonix drip initiated. No tachycardia, latest, lowest, BP 153/53. CBC had not been obtained this morning.  H&H now ordered.  Hospitalist also ordered transfusion with 1 unit of PRBCs given her trending anemia from yesterday's labs. No BMs.  OBJECTIVE:         Vital signs in last 24 hours:    Temp:  [98 F (36.7 C)-98.3 F (36.8 C)] 98.1 F (36.7 C) (10/10 0449) Pulse Rate:  [67-86] 86 (10/10 0449) Resp:  [18] 18 (10/10 0449) BP: (153-164)/(52-53) 153/53 (10/10 0449) SpO2:  [92 %-100 %] 98 % (10/10 0449) Last BM Date: 06/13/18 Filed Weights   06/10/18 2012 06/11/18 0500 06/12/18 2041  Weight: 67.1 kg 67.1 kg 67.2 kg   General: Looks acutely/chronically ill.  Looks about the same as yesterday. Heart: RRR. Chest: Clear.  Slight dyspnea. Abdomen: Soft.  Tender in the left upper quadrant and right mid abdomen.  No guarding or rebound.  Bowel sounds hypoactive. Extremities: Anasarca.  Bruising especially on the arms. Neuro/Psych: Alert.  Oriented x3.  Moves all 4 limbs.  Intake/Output from previous day: 10/09 0701 - 10/10 0700 In: 1118.3 [P.O.:460; NG/GT:658.3] Out: 1300 [Urine:1250; Emesis/NG output:50]  Intake/Output this shift: No intake/output data recorded.  Lab Results: Recent Labs    06/14/18 0818  WBC 15.0*  HGB 7.9*  HCT 26.2*  PLT 348   BMET Recent Labs    06/13/18 0526 06/14/18 0818 06/15/18 0456  NA 140 138 142  K 5.4* 4.7 5.2*  CL 103 103 100  CO2 24 28 32  GLUCOSE 82 163* 191*  BUN 57* 61* 78*  CREATININE 2.32* 2.02* 1.87*  CALCIUM 9.0 8.8* 8.6*   Scheduled Meds: . sodium chloride   Intravenous Once  . ALPRAZolam  0.5 mg Oral QHS  . carvedilol  12.5  mg Oral BID WC  . cholecalciferol  5,000 Units Oral Daily  . cloNIDine  0.1 mg Oral TID  . doxycycline  100 mg Per Tube Q12H  . DULoxetine  60 mg Oral Daily  . ferrous sulfate  325 mg Oral BID WC  . furosemide  40 mg Oral BID  . gabapentin  300 mg Oral BID  . insulin aspart  0-9 Units Subcutaneous Q4H  . isosorbide mononitrate  15 mg Oral Daily  . lipase/protease/amylase  12,000 Units Oral TID AC  . lipase/protease/amylase)  20,880 Units Per Tube Once   And  . sodium bicarbonate  650 mg Per Tube Once  . mirtazapine  7.5 mg Oral QHS  . mometasone-formoterol  2 puff Inhalation BID  . nicotine  21 mg Transdermal Daily  . [START ON 06/18/2018] pantoprazole  40 mg Intravenous Q12H  . vitamin B-12  100 mcg Per Tube Daily   Continuous Infusions: . pantoprazole (PROTONIX) IVPB 80 mg (06/15/18 0830)  . pantoprozole (PROTONIX) infusion     PRN Meds:.acetaminophen **OR** acetaminophen, alum & mag hydroxide-simeth, hydrALAZINE, HYDROcodone-acetaminophen, HYDROmorphone (DILAUDID) injection, hydrOXYzine, levalbuterol, ondansetron (ZOFRAN) IV, phenol, polyethylene glycol, zolpidem   ASSESMENT:   *    Hematemesis.  Acute onset this morning. EGD 8/7 to investigate unexplained iron deficiency anemia, maroon/FOBT positive stool, normal.  No biopsies obtained.  *   Severe, recurrent versus unresolved acute pancreatitis.  Necrosis and developing pseudocysts.  Very slow recovery inpatient who at baseline is severely debilitated.  *    Chronic Plavix.  For history peripheral vascular disease, coronary artery disease, last dose yesterday, now discontinued.  *    Cholecystectomy 04/19/2018 for suspected gallstone pancreatitis.  *    01/2018 AAA repair   PLAN   *   Give a dose of IV Reglan now.  Attempt to clear the gastric contents of blood as well as ease her nausea which has returned.  *    Consider upper endoscopy later today.    Azucena Freed  06/15/2018, 8:35 AM Phone [phone]

## 2018-06-15 NOTE — Procedures (Signed)
Arterial Catheter Insertion Procedure Note [name] [id number] [birth year]  Procedure: Insertion of Arterial Catheter  Indications: Blood pressure monitoring  Procedure Details Consent: Risks of procedure as well as the alternatives and risks of each were explained to the (patient/caregiver).  Consent for procedure obtained. Time Out: Verified patient identification, verified procedure, site/side was marked, verified correct patient position, special equipment/implants available, medications/allergies/relevent history reviewed, required imaging and test results available.  Performed  Maximum sterile technique was used including antiseptics, cap, gloves, gown and hand hygiene. Skin prep: Chlorhexidine; local anesthetic administered 20 gauge catheter was inserted into right radial artery using the Seldinger technique. ULTRASOUND GUIDANCE USED: NO Evaluation Blood flow good; BP tracing good. Complications: No apparent complications.   Donnetta Hail 06/15/2018

## 2018-06-16 ENCOUNTER — Encounter (HOSPITAL_COMMUNITY)
Admission: EM | Disposition: A | Discharge status: Skilled Nursing Facility | Payer: Self-pay | Source: Home / Self Care | Attending: Family Medicine

## 2018-06-16 ENCOUNTER — Inpatient Hospital Stay (HOSPITAL_COMMUNITY): Payer: Medicare Other | Admitting: Certified Registered Nurse Anesthetist

## 2018-06-16 ENCOUNTER — Encounter (HOSPITAL_COMMUNITY): Payer: Self-pay

## 2018-06-16 DIAGNOSIS — L8990 Pressure ulcer of unspecified site, unspecified stage: Secondary | ICD-10-CM

## 2018-06-16 DIAGNOSIS — L8994 Pressure ulcer of unspecified site, stage 4: Secondary | ICD-10-CM

## 2018-06-16 HISTORY — PX: ESOPHAGOGASTRODUODENOSCOPY: SHX5428

## 2018-06-16 LAB — PREPARE RBC (CROSSMATCH)

## 2018-06-16 LAB — BASIC METABOLIC PANEL
ANION GAP: 9 (ref 5–15)
BUN: 119 mg/dL — AB (ref 8–23)
CO2: 29 mmol/L (ref 22–32)
Calcium: 8.2 mg/dL — ABNORMAL LOW (ref 8.9–10.3)
Chloride: 106 mmol/L (ref 98–111)
Creatinine, Ser: 1.99 mg/dL — ABNORMAL HIGH (ref 0.44–1.00)
GFR calc non Af Amer: 25 mL/min — ABNORMAL LOW (ref >60)
GFR, EST AFRICAN AMERICAN: 29 mL/min — AB (ref >60)
Glucose, Bld: 141 mg/dL — ABNORMAL HIGH (ref 70–99)
Potassium: 5.4 mmol/L — ABNORMAL HIGH (ref 3.5–5.1)
Sodium: 144 mmol/L (ref 135–145)

## 2018-06-16 LAB — HEMOGLOBIN AND HEMATOCRIT, BLOOD
HCT: 21.8 % — ABNORMAL LOW (ref 36.0–46.0)
HEMATOCRIT: 35.2 % — AB (ref 36.0–46.0)
HEMOGLOBIN: 11.4 g/dL — AB (ref 12.0–15.0)
Hemoglobin: 7.2 g/dL — ABNORMAL LOW (ref 12.0–15.0)

## 2018-06-16 LAB — GLUCOSE, CAPILLARY
GLUCOSE-CAPILLARY: 130 mg/dL — AB (ref 70–99)
GLUCOSE-CAPILLARY: 131 mg/dL — AB (ref 70–99)
Glucose-Capillary: 138 mg/dL — ABNORMAL HIGH (ref 70–99)
Glucose-Capillary: 144 mg/dL — ABNORMAL HIGH (ref 70–99)
Glucose-Capillary: 179 mg/dL — ABNORMAL HIGH (ref 70–99)
Glucose-Capillary: 168 mg/dL — ABNORMAL HIGH (ref 70–99)
Glucose-Capillary: 135 mg/dL — ABNORMAL HIGH (ref 70–99)

## 2018-06-16 SURGERY — EGD (ESOPHAGOGASTRODUODENOSCOPY)
Anesthesia: General

## 2018-06-16 MED ORDER — LIDOCAINE HCL (CARDIAC) PF 100 MG/5ML IV SOSY
PREFILLED_SYRINGE | INTRAVENOUS | Status: DC | PRN
  Administered 2018-06-16: 80 mg via INTRAVENOUS

## 2018-06-16 MED ORDER — ISOSORBIDE MONONITRATE ER 30 MG PO TB24
15.0000 mg | ORAL_TABLET | Freq: Every day | ORAL | Status: DC
  Administered 2018-06-16 – 2018-06-26 (×11): 15 mg via ORAL
  Filled 2018-06-16 (×11): qty 1

## 2018-06-16 MED ORDER — HYDROMORPHONE HCL 1 MG/ML IJ SOLN: 0.2500 mg | INTRAMUSCULAR | Status: DC | PRN

## 2018-06-16 MED ORDER — EPHEDRINE SULFATE-NACL 50-0.9 MG/10ML-% IV SOSY
PREFILLED_SYRINGE | INTRAVENOUS | Status: DC | PRN
  Administered 2018-06-16 (×2): 25 mg via INTRAVENOUS

## 2018-06-16 MED ORDER — PHENYLEPHRINE 40 MCG/ML (10ML) SYRINGE FOR IV PUSH (FOR BLOOD PRESSURE SUPPORT)
PREFILLED_SYRINGE | INTRAVENOUS | Status: DC | PRN
  Administered 2018-06-16: 160 ug via INTRAVENOUS

## 2018-06-16 MED ORDER — LACTATED RINGERS IV SOLN
INTRAVENOUS | Status: DC | PRN
  Administered 2018-06-16 (×2): via INTRAVENOUS

## 2018-06-16 MED ORDER — SUCCINYLCHOLINE CHLORIDE 200 MG/10ML IV SOSY
PREFILLED_SYRINGE | INTRAVENOUS | Status: DC | PRN
  Administered 2018-06-16: 120 mg via INTRAVENOUS

## 2018-06-16 MED ORDER — ISOSORBIDE MONONITRATE ER 30 MG PO TB24: 15.0000 mg | ORAL_TABLET | Freq: Every day | ORAL | Status: DC

## 2018-06-16 MED ORDER — SODIUM CHLORIDE 0.9 % IV BOLUS: 250.0000 mL | Freq: Once | INTRAVENOUS | Status: DC

## 2018-06-16 MED ORDER — BOOST / RESOURCE BREEZE PO LIQD CUSTOM
1.0000 | Freq: Three times a day (TID) | ORAL | Status: DC
  Administered 2018-06-17 – 2018-06-22 (×15): 1 via ORAL
  Administered 2018-06-23: 22:00:00 via ORAL
  Administered 2018-06-23 – 2018-06-24 (×3): 1 via ORAL
  Administered 2018-06-24: 21:00:00 via ORAL
  Administered 2018-06-24 – 2018-06-25 (×2): 1 via ORAL
  Administered 2018-06-25: 22:00:00 via ORAL
  Administered 2018-06-26: 1 via ORAL

## 2018-06-16 MED ORDER — PANTOPRAZOLE SODIUM 40 MG PO PACK
40.0000 mg | PACK | Freq: Two times a day (BID) | ORAL | Status: DC
  Administered 2018-06-17 – 2018-06-26 (×19): 40 mg via ORAL
  Filled 2018-06-16 (×19): qty 20

## 2018-06-16 MED ORDER — PROPOFOL 10 MG/ML IV BOLUS
INTRAVENOUS | Status: DC | PRN
  Administered 2018-06-16: 130 mg via INTRAVENOUS

## 2018-06-16 MED ORDER — SUCRALFATE 1 GM/10ML PO SUSP
1.0000 g | Freq: Three times a day (TID) | ORAL | Status: DC
  Administered 2018-06-16 – 2018-06-26 (×40): 1 g via ORAL
  Filled 2018-06-16 (×40): qty 10

## 2018-06-16 MED ORDER — SODIUM CHLORIDE 0.9 % IV SOLN
INTRAVENOUS | Status: DC | PRN
  Administered 2018-06-16: 100 ug/min via INTRAVENOUS

## 2018-06-16 MED ORDER — FUROSEMIDE 10 MG/ML IJ SOLN
40.0000 mg | Freq: Once | INTRAMUSCULAR | Status: AC
  Administered 2018-06-16: 40 mg via INTRAVENOUS
  Filled 2018-06-16: qty 4

## 2018-06-16 MED ORDER — PROMETHAZINE HCL 25 MG/ML IJ SOLN: 6.2500 mg | INTRAMUSCULAR | Status: DC | PRN

## 2018-06-16 MED ORDER — FENTANYL CITRATE (PF) 100 MCG/2ML IJ SOLN
INTRAMUSCULAR | Status: DC | PRN
  Administered 2018-06-16: 100 ug via INTRAVENOUS

## 2018-06-16 MED ORDER — ONDANSETRON HCL 4 MG/2ML IJ SOLN
INTRAMUSCULAR | Status: DC | PRN
  Administered 2018-06-16: 4 mg via INTRAVENOUS

## 2018-06-16 MED ORDER — CARVEDILOL 12.5 MG PO TABS
12.5000 mg | ORAL_TABLET | Freq: Two times a day (BID) | ORAL | Status: DC
  Administered 2018-06-16 – 2018-06-26 (×20): 12.5 mg via ORAL
  Filled 2018-06-16 (×20): qty 1

## 2018-06-16 MED ORDER — DARBEPOETIN ALFA 60 MCG/0.3ML IJ SOSY
60.0000 ug | PREFILLED_SYRINGE | INTRAMUSCULAR | Status: DC
  Filled 2018-06-16 (×2): qty 0.3

## 2018-06-16 MED ORDER — SODIUM CHLORIDE 0.9% IV SOLUTION
Freq: Once | INTRAVENOUS | Status: AC
  Administered 2018-06-16: 13:00:00 via INTRAVENOUS

## 2018-06-16 NOTE — Progress Notes (Signed)
[facility] Progress Note   Subjective:     Called back to see patient  Patient developed upper GI bleed and underwent endoscopy today revealing esophageal ulcers evidence of recent bleeding related to NG tube  Developed acute blood loss anemia has required PRBC transfusions.  Furosemide has been held in the setting of gastrointestinal hemorrhage  Serum creatinine has been stable at 1.99, potassium stable at 5.4.  Has progressive azotemia with a BUN of 119, largely reflective of GI hemorrhage  Currently in ICU, blood pressure stable not on pressors  Patient groggy, awakens to voice, answers basic questions; no other family present   Objective Vitals:   06/16/18 1300 06/16/18 1315 06/16/18 1323 06/16/18 1330  BP: (!) 151/86 (!) 159/72  (!) 168/59  Pulse: (!) 101 (!) 103 (!) 103 (!) 105  Resp: 18 19 17 14   Temp:  98.1 F (36.7 C)  98.2 F (36.8 C)  TempSrc:  Axillary  Axillary  SpO2: 100% 100% 100% 100%  Weight:      Height:       Physical Exam General: chronically ill appearing Heart: no rub, RRR Lungs: normal WOB, dec BS bases Abdomen: soft, mildly TTP epigastrium Extremities:  Anasarca 4 limbs, 2+ pitting; LUE AVF +t/b  Additional Objective Labs: Basic Metabolic Panel: Recent Labs  Lab 06/10/18 0628  06/12/18 0422 06/13/18 0526 06/14/18 0818 06/15/18 0456 06/16/18 0433  NA 138   < > 139 140 138 142 144  K 5.1   < > 5.4* 5.4* 4.7 5.2* 5.4*  CL 96*   < > 100 103 103 100 106  CO2 29   < > 28 24 28  32 29  GLUCOSE 123*   < > 84 82 163* 191* 141*  BUN 62*   < > 59* 57* 61* 78* 119*  CREATININE 2.63*   < > 2.28* 2.32* 2.02* 1.87* 1.99*  CALCIUM 9.3   < > 9.1 9.0 8.8* 8.6* 8.2*  PHOS 6.5*  --  6.0* 5.6*  --   --   --    < > = values in this interval not displayed.   Liver Function Tests: Recent Labs  Lab 06/10/18 0628 06/12/18 0422  AST  --  19  ALT  --  9  ALKPHOS  --  35*  BILITOT  --  1.0  PROT  --  5.2*  ALBUMIN 2.0* 2.1*    Recent Labs  Lab 06/10/18 0628 06/11/18 0444 06/12/18 0422  LIPASE 205* 1,307* 140*   CBC: Recent Labs  Lab 06/12/18 0422 06/14/18 0818 06/15/18 0456  06/15/18 1655 06/15/18 2251 06/16/18 0433  WBC 13.5* 15.0* 11.7*  --   --   --   --   HGB 8.5* 7.9* 6.1*   < > 8.9* 7.8* 7.2*  HCT 27.6* 26.2* 20.5*   < > 28.0* 23.7* 21.8*  MCV 101.1* 100.4* 102.5*  --   --   --   --   PLT 352 348 332  --   --   --   --    < > = values in this interval not displayed.   Blood Culture    Component Value Date/Time   SDES BLOOD RIGHT HAND 06/05/2018 1453   SPECREQUEST  06/05/2018 1453    BOTTLES DRAWN AEROBIC ONLY Blood Culture adequate volume   CULT  06/05/2018 1453    NO GROWTH 5 DAYS Performed at [facility], [place] [address], [place], [place] [address]    REPTSTATUS  06/10/2018 FINAL 06/05/2018 1453    Cardiac Enzymes: No results for input(s): CKTOTAL, CKMB, CKMBINDEX, TROPONINI in the last 168 hours. CBG: Recent Labs  Lab 06/15/18 2326 06/16/18 0342 06/16/18 0756 06/16/18 1027 06/16/18 1149  GLUCAP 141* 130* 144* 179* 168*   Iron Studies: No results for input(s): IRON, TIBC, TRANSFERRIN, FERRITIN in the last 72 hours. @lablastinr3 @ Studies/Results: Dg Chest Port 1 View  Result Date: 06/15/2018 CLINICAL DATA:  Shortness of breath EXAM: PORTABLE CHEST 1 VIEW COMPARISON:  05/28/2018 FINDINGS: Cardiac shadow is mildly prominent. Postsurgical changes are again seen. Feeding catheter is noted extending into the stomach. The lungs are well aerated bilaterally. Mild central vascular congestion with interstitial edema is seen. No sizable effusion is noted. IMPRESSION: Changes of mild CHF. Electronically Signed   By: Inez Catalina M.D.   On: 06/15/2018 14:12   Dg Abd Portable 1v  Result Date: 06/15/2018 CLINICAL DATA:  Vomiting. EXAM: PORTABLE ABDOMEN - 1 VIEW COMPARISON:  Abdominal x-ray dated June 09, 2018. FINDINGS: Feeding tube tip has slightly retracted, now in the  distal second portion of the duodenum. The bowel gas pattern is normal. Unchanged aorto bi-iliac stent graft and left renal artery stent. No acute osseous abnormality. IMPRESSION: 1. Slight retraction of the feeding tube with the tip now in the distal second portion of the duodenum. 2. Nonobstructive bowel gas pattern. Electronically Signed   By: Titus Dubin M.D.   On: 06/15/2018 11:21   Medications: . sodium chloride 250 mL (06/16/18 0821)  . sodium chloride    . pantoprozole (PROTONIX) infusion Stopped (06/16/18 0754)   . ALPRAZolam  0.5 mg Oral QHS  . carvedilol  12.5 mg Oral BID WC  . cholecalciferol  5,000 Units Oral Daily  . doxycycline  100 mg Per Tube Q12H  . DULoxetine  60 mg Oral Daily  . gabapentin  300 mg Oral BID  . insulin aspart  0-9 Units Subcutaneous Q4H  . isosorbide mononitrate  15 mg Oral Daily  . lipase/protease/amylase  12,000 Units Oral TID AC  . lipase/protease/amylase)  20,880 Units Per Tube Once   And  . sodium bicarbonate  650 mg Per Tube Once  . mouth rinse  15 mL Mouth Rinse BID  . mirtazapine  7.5 mg Oral QHS  . mometasone-formoterol  2 puff Inhalation BID  . [START ON 06/18/2018] pantoprazole  40 mg Intravenous Q12H  . sucralfate  1 g Oral TID WC & HS  . vitamin B-12  100 mcg Per Tube Daily    Assessment/Plan: 1.  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  Follow[facility] by P[facility] Pr[facility] at [facility].  Has LUV AVF.  In setting of GI bleed renal function has remained fairly stable, worsening azotemia largely reflects catabolic state with GI bleed.  2.  HTN: labile, CTM; resume amlodipine if remains elevated  3.  Hyperkalemia: Potassium remains mildly elevated.  Would continue to monitor at the current time.  Would use Veltassa or Lokelma, if necessary for potassium greater than 5.7.  4.  Anemia:   Recent acute blood loss anemia.  Received 40 mcg of Aranesp on 10/11.  Will re-dose again today.  We  will follow-up on patient again tomorrow.  Pearson Grippe MD 06/16/2018, 2:38 PM  [facility]

## 2018-06-16 NOTE — Op Note (Signed)
[facility] Patient Name: [name] Procedure Date : 06/16/2018 MRN: [id number] Attending MD: Thornton Park MD, MD Date of Birth: 03/25/1951 CSN: [id number] Age: 67 Admit Type: Inpatient Procedure:                Upper GI endoscopy Indications:              Hematochezia Providers:                Thornton Park MD, MD, Cleda Daub, RN, Cherylynn Ridges, Technician, Kerrie Pleasure, CRNA Referring MD:              Medicines:                See the Anesthesia note for documentation of the                            administered medications Complications:            No immediate complications. Estimated Blood Loss:     Estimated blood loss: none. Procedure:                Pre-Anesthesia Assessment:                           - Prior to the procedure, a History and Physical                            was performed, and patient medications and                            allergies were reviewed. The patient's tolerance of                            previous anesthesia was also reviewed. The risks                            and benefits of the procedure and the sedation                            options and risks were discussed with the patient.                            All questions were answered, and informed consent                            was obtained. Prior Anticoagulants: The patient has                            taken Plavix (clopidogrel), last dose was 1 day                            prior to procedure. ASA Grade Assessment: IV - A  patient with severe systemic disease that is a                            constant threat to life. After reviewing the risks                            and benefits, the patient was deemed in                            satisfactory condition to undergo the procedure.                           After obtaining informed consent, the endoscope was                            passed  under direct vision. Throughout the                            procedure, the patient's blood pressure, pulse, and                            oxygen saturations were monitored continuously. The                            GIF-H190 ([id number]) Olympus Adult EGD was introduced                            through the mouth, and advanced to the second part                            of duodenum. The upper GI endoscopy was                            accomplished without difficulty. The patient                            tolerated the procedure well. Scope In: Scope Out: Findings:      One cratered esophageal ulcer with overlying heme pigment was located 36       cm from the incisors. The lesion was 8 mm in largest dimension. There is       a smaller more superficial esophageal ulcer at the GE junction located       40 cm from the incisors.      Clotted blood was found in the cardia, in the gastric fundus and in the       gastric body. This limited a full evaluation of the mucosa. However, the       examined mucosa appeared normal. No active bleeding identified.      Large clot encompassing the coiled tip of the Dobhoff was found in the       gastric fundus. It was too large to pull through the GE junction for       removal. I attempted to remove the clot repeatedly using a cold snare.       Eventually, the clot was small enough to easily  remove the Dobhoff       without any resistance.      The examined duodenum was except for diffuse melanosis. No blood was       present. Impression:               - Two non-bleeding esophageal ulcer with evidence                            for recent bleeding - likely due to Dobhoff tube.                           - Clotted blood in the cardia, in the gastric                            fundus and in the gastric body limiting a full                            evaluation.                           - Large clot on the tip of the Dobhoff had limited                             prior removal. The clot was partially removed and                            the Dobhoff successfully removed.                           - Normal examined duodenum.                           - No specimens collected.                           - No active bleeding was noted during this                            procedure. Recommendation:           - Clear liquid diet today. With trial at diet                            advance.                           - Continue present medications including IV                            Protonix BID                           - Add Carafate 1 g slurry TID                           - Avoid Dobhoff or NG tube replacement given the  large esophageal ulcer.                           - Hold Plavix for an additional 24-48 hours if                            clinically able given the high risk of rebleeding                           - Continue serial hgb/hct with transfusion as                            indicated. Procedure Code(s):        --- Professional ---                           [id number], Esophagogastroduodenoscopy, flexible,                            transoral; diagnostic, including collection of                            specimen(s) by brushing or washing, when performed                            (separate procedure) Diagnosis Code(s):        --- Professional ---                           K22.10, Ulcer of esophagus without bleeding                           K92.2, Gastrointestinal hemorrhage, unspecified                           R12, Heartburn CPT copyright 2018 American Medical Association. All rights reserved. The codes documented in this report are preliminary and upon coder review may  be revised to meet current compliance requirements. Thornton Park MD, MD 06/16/2018 10:33:43 AM This report has been signed electronically. Number of Addenda: 0

## 2018-06-16 NOTE — Interval H&P Note (Signed)
History and Physical Interval Note:  06/16/2018 9:06 AM  [name]  has presented today for surgery, with the diagnosis of Vomiting blood.  The various methods of treatment have been discussed with the patient and family. After consideration of risks, benefits and other options for treatment, the patient has consented to  Procedure(s): ESOPHAGOGASTRODUODENOSCOPY (EGD) (N/A) as a surgical intervention .  The patient's history has been reviewed, patient examined, no change in status, stable for surgery.  I have reviewed the patient's chart and labs.  Questions were answered to the patient's satisfaction. Her two daughters were present.     Thornton Park

## 2018-06-16 NOTE — Care Management Note (Signed)
Case Management Note Original note by: Bethena Roys, RN 06/13/2018, 3:15 PM  Patient Details  Name: [name] MRN: [id number] Date of Birth: [birth year]  Subjective/Objective: Pt presented for gallstones- pancreatitis- abdominal pain nausea and vomiting. Cortrak in place with tube feeds. PTA from home alone in an apartment. CM was able to speak with daughter [name]. [name] has refused SNF at this time. CM asked daughter more than once in regards to plan of care- daughter refused SNF stating that patient would not get the care that she needed. Plan will be for home with [place]. Pt will need orders for [facility] RN, PT, OT, Aide CSW  And F2F. Pt will ned DME Wheelchair. Pt has 3n1, RW and oxygen in the home. Per daughter [name] pt will go to her other daughters home  @ [place].               Action/Plan: CM called Referral to [facility]- for [facility]. SOC to begin within 24-48 hours post transition home. Awaiting PT notes referral not made for Wheelchair at this time. Pt will not need Hospital bed. Tube Feeds @ night- Unsure if will need for home. If so, Daughter will need education- Staff to teach family in regards to tube feedings. [facility] will supply tube feedings/ Pump will need orders if this is plan for home. CSW with [facility] will set the patient up with Palliative Services in the home ([facility]). CM did discuss transportation with daughter- will assess again as it gets closer to transition home to see if patient needs ambulance vs private vehicle. Patient sitting in recliner during time of visit. Will continue to monitor.   Expected Discharge Date:                  Expected Discharge Plan:  [place]  In-House Referral:  Clinical Social Work  Discharge planning Services  CM Consult  Post Acute Care Choice:  Durable Medical Equipment, Home Health Choice offered to:  Patient, Adult Children  DME Arranged:  Tube feeding, Tube feeding pump DME  Agency:  [place]:  RN, PT, OT, Nurse's Aide, Social Work, Disease Management, Refused SNF(Daughter refusing SNF at this time. ) [place]:  [place]  Status of Service:  Completed, signed off  If discussed at [employer], dates discussed:    Additional Comments:  06/16/18 J. Bobbiejo Ishikawa, RN, BSN 103 Met with pt's daughter and sister, per their request to discuss dc planning.  They are concerned about dc plan for pt, and whether pt can be managed at home now.  Awaiting PT/OT to reevaluate since ICU admission, since pt now more fatigued and weak.  Sister states she lives 2 hours away, daughter plans to take a couple of weeks off when pt is discharged.  I recommended to family to wait until completion of therapy consults to determine level of care needed at dc, and they reluctantly agree with this.  Pt active with [facility] prior to admission for home care needs.  Will continue to follow; await PT/OT recommendations.    Reinaldo Raddle, RN, BSN  Trauma/Neuro ICU Case Manager [phone]

## 2018-06-16 NOTE — Progress Notes (Signed)
PT Cancellation Note  Patient Details Name: MAURISA TESMER MRN: [id number] DOB: 1950/11/09   Cancelled Treatment:    Reason Eval/Treat Not Completed: Patient at procedure or test/unavailable.  Pt is at EGD.  PT will check back later this PM as time allows.  Thanks,    Barbarann Ehlers. Treavor Blomquist, PT, DPT  Acute Rehabilitation [phone] pager #[phone] office   06/16/2018, 9:51 AM

## 2018-06-16 NOTE — Progress Notes (Signed)
BP dropped to 85/64. No change in orientation status. Pt is oriented x 4. Pt is drowsy, but has been since the beginning of the shift.  MD notified. Will continue to monitor.

## 2018-06-16 NOTE — Anesthesia Postprocedure Evaluation (Signed)
Anesthesia Post Note  Patient: [name]  Procedure(s) Performed: ESOPHAGOGASTRODUODENOSCOPY (EGD) (N/A )     Patient location during evaluation: PACU Anesthesia Type: General Level of consciousness: awake and alert Pain management: pain level controlled Vital Signs Assessment: post-procedure vital signs reviewed and stable Respiratory status: spontaneous breathing, nonlabored ventilation and respiratory function stable Cardiovascular status: blood pressure returned to baseline and stable Postop Assessment: no apparent nausea or vomiting Anesthetic complications: no    Last Vitals:  Vitals:   06/16/18 1025 06/16/18 1030  BP: 128/73 125/72  Pulse: (!) 104 (!) 102  Resp: (!) 25 (!) 24  Temp: (!) 36.1 C (!) 36.2 C  SpO2: 95% 98%    Last Pain:  Vitals:   06/16/18 1030  TempSrc:   PainSc: Asleep                 Lynda Rainwater

## 2018-06-16 NOTE — Progress Notes (Signed)
Patients foley was removed per order at 1700. Patient due to void by 2300 (six hours post foley catheter removal). Will continue to monitor.

## 2018-06-16 NOTE — Progress Notes (Signed)
Nutrition Follow-up  DOCUMENTATION CODES:   Severe malnutrition in context of chronic illness  INTERVENTION:   Recommend TPN in the setting of suspected inadequate nutrition in patient who was diagnosed with severe malnutrition.   Start Boost Breeze po TID, each supplement provides 250 kcal and 9 grams of protein  Encourage PO intake as diet is advanced   NUTRITION DIAGNOSIS:   Severe Malnutrition related to chronic illness(CHF, AAA s/p graft repair, pancreatitis) as evidenced by severe muscle depletion, moderate muscle depletion, mild fat depletion, moderate fat depletion, energy intake < or equal to 75% for > or equal to 1 month. Ongoing.   GOAL:   Patient will meet greater than or equal to 90% of their needs Not met.   MONITOR:   Diet advancement, Labs, TF tolerance, I & O's, Weight trends  ASSESSMENT:   Patient with PMH significant for HTN, HLD, DM, COPD, GERD, gout, depression with anxiety, tobacco abuse, AAA s/p stent graft repair 01/2018, CAD, CABG, CHF, CKD IV s/p AV fistula for anticipated transition to HD, GI bleeding, mitral valve regurgitation, PAD, and s/p lap chole 04/19/18. Presents this admission with abdominal pain. Admitted for recurrent vs never resolved acute pancreatitis and GIB.   Pt discussed during ICU rounds and with RN.  Discussed with RD following pt prior to tx to ICU. Per RD PO intake has been very limited due to chronic pain. Messaged MD with recommendations.   10/10 moved to ICU due to acute GI bleed 10/11 s/p EGD which revealed that pt has a large esophageal ulcer (unable to replace Cortrak tube due to this), and golf ball sized blood clot surrounding the tip of the Cortrak tube preventing removal on 10/10.  Per GI plan to start clear liquids and diet advancement as tolerated.   Medications reviewed and include: vitamin D, SSI,  Labs reviewed: K+ 5.4 (H)   Diet Order:   Diet Order            Diet clear liquid Room service appropriate? Yes;  Fluid consistency: Thin  Diet effective now              EDUCATION NEEDS:   Not appropriate for education at this time  Skin:  Skin Assessment: Reviewed RN Assessment  Last BM:  10/9  Height:   Ht Readings from Last 1 Encounters:  05/30/18 '5\' 5"'$  (1.651 m)    Weight:   Wt Readings from Last 1 Encounters:  06/16/18 68.4 kg    Ideal Body Weight:  56.8 kg  BMI:  Body mass index is 25.09 kg/m.  Estimated Nutritional Needs:   Kcal:  1700-1900 kcal  Protein:  85-100 g  Fluid:  >/= 1.7 L/day  Maylon Peppers RD, LDN, CNSC [phone] Pager [phone] After Hours Pager

## 2018-06-16 NOTE — Progress Notes (Signed)
Patient ID: [name], female   DOB: December 06, 1950, 67 y.o.   MRN: [id number] egd noted, no further recs, can have diet advanced when able to per gi

## 2018-06-16 NOTE — Anesthesia Procedure Notes (Signed)
Procedure Name: Intubation Date/Time: 06/16/2018 9:21 AM Performed by: Carney Living, CRNA Pre-anesthesia Checklist: Patient identified, Emergency Drugs available, Suction available, Patient being monitored and Timeout performed Patient Re-evaluated:Patient Re-evaluated prior to induction Oxygen Delivery Method: Circle system utilized Preoxygenation: Pre-oxygenation with 100% oxygen Induction Type: IV induction, Cricoid Pressure applied and Rapid sequence Laryngoscope Size: Mac and 4 Grade View: Grade I Tube type: Oral Tube size: 7.5 mm Number of attempts: 1 Airway Equipment and Method: Stylet Placement Confirmation: ETT inserted through vocal cords under direct vision,  positive ETCO2 and breath sounds checked- equal and bilateral Secured at: 22 cm Tube secured with: Tape Dental Injury: Teeth and Oropharynx as per pre-operative assessment

## 2018-06-16 NOTE — Progress Notes (Signed)
Pt transferred into room 5W02. SBAR received from ICU RN. Pt oriented to room.

## 2018-06-16 NOTE — Social Work (Signed)
CSW acknowledging consult for SNF placement. Will follow for therapy recommendations.   Donny Heffern H Hermann Dottavio, LCSWA [place] Clinical Social Work [phone]   

## 2018-06-16 NOTE — Transfer of Care (Signed)
Immediate Anesthesia Transfer of Care Note  Patient: [name]  Procedure(s) Performed: ESOPHAGOGASTRODUODENOSCOPY (EGD) (N/A )  Patient Location: PACU  Anesthesia Type:General  Level of Consciousness: awake, alert  and patient cooperative  Airway & Oxygen Therapy: Patient Spontanous Breathing and Patient connected to nasal cannula oxygen  Post-op Assessment: Report given to RN, Post -op Vital signs reviewed and stable and Patient moving all extremities X 4  Post vital signs: Reviewed and stable  Last Vitals:  Vitals Value Taken Time  BP 128/73 06/16/2018 10:26 AM  Temp    Pulse 103 06/16/2018 10:32 AM  Resp 27 06/16/2018 10:32 AM  SpO2 96 % 06/16/2018 10:32 AM  Vitals shown include unvalidated device data.  Last Pain:  Vitals:   06/16/18 0808  TempSrc: Oral  PainSc: 0-No pain      Patients Stated Pain Goal: 2 (08/24/74 8832)  Complications: No apparent anesthesia complications

## 2018-06-16 NOTE — Progress Notes (Signed)
NAME:  [name], MRN:  [id number], DOB:  1951/03/31, LOS: 41 ADMISSION DATE:  05/28/2018, CONSULTATION DATE:  10/10 REFERRING MD:  Tyrell Antonio, CHIEF COMPLAINT:  Hypotension, hematemesis    Brief History   67 year old female with complex medical history including hypertension, hyperlipidemia, diabetes, COPD on home O2, A. fib, CKD 4, mitral valve regurg, CAD status post CABG, CHF with previous GI bleeding with recent admit in August due to GI bleed and gallstone pancreatitis.  She was readmitted 9/22 with recurrent versus smoldering pancreatitis with CT scan concerning for pseudocyst and necrosis.  She has been managed medically with IV fluids and enteral tube feeding.  Course has also been complicated by AKI on CKD 4 of unclear etiology and anasarca. On 10/10 she developed hematemesis.  She had 2 episodes, one episode with approximately 80 cc of bright red blood and some blood clots per RN.  Previously on subcu heparin for DVT prophylaxis and WAS on Plavix for PVD, CAD.  Last dose 10/9.  Past Medical History  Hypertension, hyperlipidemia, diabetes, COPD on home O2, GERD, gout, depression, anxiety, A. fib not on anticoagulation, AAA, CAD status post CABG, CHF, CKD 4-5, mitral valve regurgitation, PAD  Significant Hospital Events   Admitted to ICU with hematemesis 10/10. EGD 10/11 showed healing esophageal ulcers.  Consults: date of consult/date signed off & final recs:  Renal 10/4>>> Palliative care 10/4>>>  PCCM 10/10>>> GI 10/10>>>   Procedures (surgical and bedside):  EGD 10/11/  Significant Diagnostic Tests:  Right upper extremity venous Doppler 10/9 >>negative for DVT, acute superficial right cephalic vein thrombosis  Micro Data:  BC x 2 9/22>>> NEG CDiff 9/25>>> neg  BC x 2 9/30>>> NEG    Antimicrobials:  ID: Zosyn 9/22-9/30,  Merrem 9/30-10/5  Doxycycline 10/9>>>  Subjective:  Abdominal pain has resolved. No further hematemesis. + melena.  Objective   Blood  pressure (!) 168/59, pulse (!) 105, temperature 98.2 F (36.8 C), temperature source Axillary, resp. rate 14, height 5\' 5"  (1.651 m), weight 68.4 kg, SpO2 100 %.        Intake/Output Summary (Last 24 hours) at 06/16/2018 1550 Last data filed at 06/16/2018 1032 Gross per 24 hour  Intake 1271.22 ml  Output 900 ml  Net 371.22 ml   Filed Weights   06/12/18 2041 06/15/18 2000 06/16/18 0311  Weight: 67.2 kg 70.6 kg 68.4 kg    Examination: General: Chronically ill-appearing female, uncomfortable appearing but no acute distress HENT: Mucus membranes are dry. Lungs: Respirations even nonlabored on nasal cannula, diminished bases otherwise clear Cardiovascular: S1-S2 regular rate and rhythm, sinus rhythm 86 Abdomen: Soft, mildly tender diffusely, hypoactive bowel sounds Extremities: Warm and dry, 1-2+ BLE edema Neuro: Awake, alert, slow to respond but overall oriented   Resolved Hospital Problem list     Assessment & Plan:  Hematemesis-in setting of Plavix from Dobhoff tube related ulceration.  Feeding tube removed.  Continue PPI and add carafate Low risk endoscopically - can transfer our of ICU.  Hemorrhagic shock-related to upper GI bleed now resolved.  Severe recurrent versus unresolved acute pancreatitis with necrosis and pseudocyst. Pain has resolved. Continue conservative management and advance diet as per General surgery.  COPD Chronic hypoxic respiratory failure-on home O2 Plan- Supplemental O2 as needed Continue Dulera and as needed Xopenex  Blood loss anemia superimposed on chronic anemia PRBCs as above Hold Plavix for now .  Acute on CKD 4-5 Plan- Renal following Follow-up chemistry Hold Lasix for now with hypotension Monitor urine output-had  been diuresing well with Lasix over last few days  Anasarca-related to hypoalbuminemia and aggressive volume resuscitation.   Plan- Holding oral Lasix for now Renal following  Right upper extremity cellulitis Right  upper extremity superficial cephalic vein thrombosis Plan- Continue doxycycline Supportive care  Disposition / Summary of Today's Plan 06/16/18   Ready for transfer.  No further critical care issues at this time. Please re-consult if necessary.    Diet: clears Pain/Anxiety/Delirium protocol (if indicated): n/a VAP protocol (if indicated): n/a  DVT prophylaxis: SCD GI prophylaxis: PPI Hyperglycemia protocol: n/a  Mobility: bedrest  Code Status: full  Family Communication: no family at bedside on NP rounds 10/10  Labs   CBC: Recent Labs  Lab 06/12/18 0422 06/14/18 0818 06/15/18 0456 06/15/18 0837 06/15/18 1655 06/15/18 2251 06/16/18 0433  WBC 13.5* 15.0* 11.7*  --   --   --   --   HGB 8.5* 7.9* 6.1* 5.7* 8.9* 7.8* 7.2*  HCT 27.6* 26.2* 20.5* 19.4* 28.0* 23.7* 21.8*  MCV 101.1* 100.4* 102.5*  --   --   --   --   PLT 352 348 332  --   --   --   --     Basic Metabolic Panel: Recent Labs  Lab 06/10/18 0628  06/12/18 0422 06/13/18 0526 06/14/18 0818 06/15/18 0456 06/16/18 0433  NA 138   < > 139 140 138 142 144  K 5.1   < > 5.4* 5.4* 4.7 5.2* 5.4*  CL 96*   < > 100 103 103 100 106  CO2 29   < > 28 24 28  32 29  GLUCOSE 123*   < > 84 82 163* 191* 141*  BUN 62*   < > 59* 57* 61* 78* 119*  CREATININE 2.63*   < > 2.28* 2.32* 2.02* 1.87* 1.99*  CALCIUM 9.3   < > 9.1 9.0 8.8* 8.6* 8.2*  MG  --   --  1.9 2.1  --   --   --   PHOS 6.5*  --  6.0* 5.6*  --   --   --    < > = values in this interval not displayed.   GFR: Estimated Creatinine Clearance: 27.4 mL/min (A) (by C-G formula based on SCr of 1.99 mg/dL (H)). Recent Labs  Lab 06/12/18 0422 06/14/18 0818 06/15/18 0456  WBC 13.5* 15.0* 11.7*    Liver Function Tests: Recent Labs  Lab 06/10/18 0628 06/12/18 0422  AST  --  19  ALT  --  9  ALKPHOS  --  35*  BILITOT  --  1.0  PROT  --  5.2*  ALBUMIN 2.0* 2.1*   Recent Labs  Lab 06/10/18 0628 06/11/18 0444 06/12/18 0422  LIPASE 205* 1,307* 140*   No  results for input(s): AMMONIA in the last 168 hours.  ABG    Component Value Date/Time   PHART 7.418 01/17/2018 1933   PCO2ART 29.3 (L) 01/17/2018 1933   PO2ART 96.0 01/17/2018 1933   HCO3 18.9 (L) 01/17/2018 1933   TCO2 30 03/21/2018 0630   ACIDBASEDEF 5.0 (H) 01/17/2018 1933   O2SAT 98.0 01/17/2018 1933     Coagulation Profile: Recent Labs  Lab 06/15/18 0837  INR 1.28    Cardiac Enzymes: No results for input(s): CKTOTAL, CKMB, CKMBINDEX, TROPONINI in the last 168 hours.  HbA1C: Hgb A1c MFr Bld  Date/Time Value Ref Range Status  05/29/2018 03:35 AM 5.3 4.8 - 5.6 % Final    Comment:    (NOTE)  Pre diabetes:          5.7%-6.4% Diabetes:              >6.4% Glycemic control for   <7.0% adults with diabetes   01/22/2018 07:02 AM 5.2 4.8 - 5.6 % Final    Comment:    (NOTE) Pre diabetes:          5.7%-6.4% Diabetes:              >6.4% Glycemic control for   <7.0% adults with diabetes     CBG: Recent Labs  Lab 06/15/18 2326 06/16/18 0342 06/16/18 0756 06/16/18 1027 06/16/18 [place], [place] [facility] Physician [place]  Pager: [phone] Mobile: [phone] After hours: [phone].

## 2018-06-16 NOTE — Progress Notes (Signed)
Physical Therapy Treatment/Re-Evaluation Patient Details Name: [name] MRN: [id number] DOB: 1950-12-27 Today's Date: 06/16/2018    History of Present Illness Pt is a 67 y/o female with c/o of abdominal pain. Note recent hospitalization 04/09/18 to 04/21/18 with GI bleed, gallstone pancreatitis, and during which she received laparoscopic cholecystectomy. Diagnosed with recurrent pancreatitis. PMH AAA, CHF, chronic back pain, CKD, COPD, GI bleed, HTN, MI, A-fib, DM, cardiac cath, CABG, maze procedure, mitral valve repair, R femoral-popliteal bypass.  Pt's course was complicated by hypotension and vomiting blood.  She had a significant drop in Hgb into the 5s.  She required several units of PRBCs and was transferred to the ICU.  She underwent an EGD on 06/16/18 which revealed and 8 mm esophageal ulcer.      PT Comments    Pt with significant weakness, debility and deconditioning both from her acute illness and from several days in the bed.  She would benefit from consideration for post acute rehab.  She will likely need a second person for safe gait trials next session (chair to follow).  PT will continue to follow acutely for safe mobility progression   Follow Up Recommendations  CIR     Equipment Recommendations  None recommended by PT    Recommendations for Other Services Rehab consult     Precautions / Restrictions Precautions Precautions: Fall;Other (comment) Precaution Comments: watch SPO2/HR    Mobility  Bed Mobility Overal bed mobility: Needs Assistance Bed Mobility: Supine to Sit     Supine to sit: Mod assist     General bed mobility comments: Mod assist to support trunk and help progress legs to EOB.  Assist at hips to weight shift to scoot to EOB.   Transfers Overall transfer level: Needs assistance Equipment used: Rolling walker (2 wheeled) Transfers: Sit to/from [facility] Sit to Stand: Mod assist Stand pivot transfers: Mod assist        General transfer comment: Mod assist to support trunk over weak legs to get to standing with RW for support.  Pivotal steps around to the chair from the bed.    Ambulation/Gait             General Gait Details: Not strong enough yet to attempt gait, would be safer with two people.           Balance Overall balance assessment: Needs assistance Sitting-balance support: Feet supported;Bilateral upper extremity supported Sitting balance-Leahy Scale: Fair     Standing balance support: Bilateral upper extremity supported Standing balance-Leahy Scale: Poor                              Cognition Arousal/Alertness: Awake/alert Behavior During Therapy: Flat affect Overall Cognitive Status: Within Functional Limits for tasks assessed                                        Exercises General Exercises - Lower Extremity Ankle Circles/Pumps: AROM;Both;10 reps Long Arc Quad: AROM;Both;10 reps Heel Slides: AAROM;Both;10 reps;Supine        Pertinent Vitals/Pain Pain Assessment: Faces Faces Pain Scale: Hurts whole lot Pain Location: stomach  Pain Descriptors / Indicators: Grimacing;Guarding Pain Intervention(s): Limited activity within patient's tolerance;Monitored during session;Repositioned;RN gave pain meds during session           PT Goals (current goals can now be found in the care  plan section) Acute Rehab PT Goals Patient Stated Goal: Hopes to be able to go home PT Goal Formulation: With patient Time For Goal Achievement: 06/30/18 Potential to Achieve Goals: Good Progress towards PT goals: Not progressing toward goals - comment(re-evaluation complete)    Frequency    Min 3X/week      PT Plan Current plan remains appropriate       AM-PAC PT "6 Clicks" Daily Activity  Outcome Measure  Difficulty turning over in bed (including adjusting bedclothes, sheets and blankets)?: Unable Difficulty moving from lying on back to sitting on  the side of the bed? : Unable Difficulty sitting down on and standing up from a chair with arms (e.g., wheelchair, bedside commode, etc,.)?: Unable Help needed moving to and from a bed to chair (including a wheelchair)?: A Lot Help needed walking in hospital room?: A Lot Help needed climbing 3-5 steps with a railing? : Total 6 Click Score: 8    End of Session Equipment Utilized During Treatment: Gait belt Activity Tolerance: Patient limited by pain;Patient limited by fatigue Patient left: in chair;with call bell/phone within reach;with family/visitor present Nurse Communication: Mobility status PT Visit Diagnosis: Muscle weakness (generalized) (M62.81) Pain - Right/Left: (abdomen) Pain - part of body: (abdomen)     Time: 3403-7096 PT Time Calculation (min) (ACUTE ONLY): 24 min  Charges:  $Therapeutic Activity: 8-22 mins          Airrion Otting B. Florena Kozma, PT, DPT  Acute Rehabilitation [phone] pager #[phone] office            06/16/2018, 5:27 PM

## 2018-06-16 NOTE — Progress Notes (Signed)
Pt BP 176/72. PRN Hydralazine given. MD notified. Will continue to monitor

## 2018-06-16 NOTE — Anesthesia Preprocedure Evaluation (Addendum)
Anesthesia Evaluation  Patient identified by MRN, date of birth, ID band Patient awake    Reviewed: Allergy & Precautions, NPO status , Patient's Chart, lab work & pertinent test results  Airway Mallampati: II  TM Distance: >3 FB Neck ROM: Full    Dental  (+) Edentulous Upper, Edentulous Lower   Pulmonary COPD,  COPD inhaler and oxygen dependent, Current Smoker,  2L [place] at night   Pulmonary exam normal breath sounds clear to auscultation       Cardiovascular hypertension, Pt. on medications and Pt. on home beta blockers + CAD, + Past MI, + Cardiac Stents, + CABG and +CHF  Normal cardiovascular exam+ dysrhythmias Atrial Fibrillation + Valvular Problems/Murmurs MR  Rhythm:Regular Rate:Normal  ECG: SB, incomplete RBBB, rate 58  ECHO: Left ventricle: The cavity size was normal. Systolic function was normal. The estimated ejection fraction was in the range of 55% to 60%. Wall motion was normal; there were no regional wall motion abnormalities. There was a reduced contribution of atrial contraction to ventricular filling, due to increased ventricular diastolic pressure or atrial contractile dysfunction. Doppler parameters are consistent with a reversible restrictive pattern, indicative of decreased left ventricular diastolic compliance and/or increased left atrial pressure (grade 3 diastolic dysfunction). Doppler parameters are consistent with high ventricular filling pressure. Mitral valve: S/P Mitral valve repair with 35mm annuloplasty ring. The findings are consistent with moderate stenosis. Mean gradient (D): 8 mm Hg. Peak gradient (D): 21 mm Hg. Valve area by   pressure half-time: 1.59 cm^2. Valve area by continuity equation (using LVOT flow): 1.2 cm^2. Left atrium: The atrium was mildly dilated. Anterior-posterior dimension: 41 mm. Atrial septum: There was increased thickness of the septum, consistent with lipomatous hypertrophy.  Sees  cardiologist Burt Knack)   Neuro/Psych PSYCHIATRIC DISORDERS Depression negative neurological ROS     GI/Hepatic Neg liver ROS, hiatal hernia, GERD  Medicated and Controlled,  Endo/Other  diabetes, Oral Hypoglycemic Agents  Renal/GU ESRFRenal disease     Musculoskeletal negative musculoskeletal ROS (+) Chronic back pain   Abdominal   Peds  Hematology  (+) anemia ,   Anesthesia Other Findings 5.4 cm AAA  Reproductive/Obstetrics                             Anesthesia Physical  Anesthesia Plan  ASA: IV  Anesthesia Plan: General   Post-op Pain Management:    Induction: Intravenous  PONV Risk Score and Plan: 2 and Treatment may vary due to age or medical condition  Airway Management Planned: Oral ETT  Additional Equipment:   Intra-op Plan:   Post-operative Plan: Extubation in OR  Informed Consent: I have reviewed the patients History and Physical, chart, labs and discussed the procedure including the risks, benefits and alternatives for the proposed anesthesia with the patient or authorized representative who has indicated his/her understanding and acceptance.   Dental advisory given  Plan Discussed with: CRNA, Anesthesiologist and Surgeon  Anesthesia Plan Comments:       Anesthesia Quick Evaluation

## 2018-06-16 NOTE — Progress Notes (Signed)
PROGRESS NOTE    CODA FILLER  RDE:[id number] DOB: 1950/12/19 DOA: 05/28/2018 PCP: Bernerd Limbo, MD    Brief Narrative: 67 year old woman PMH gallstone pancreatitis August 2019, status post laparoscopic cholecystectomy same hospitalization, presented with abdominal pain, nausea, vomiting.  Admitted for recurrent pancreatitis.  She was seen by gastroenterology, cortrak tube was placed and she was started on enteral feeds.  Continuation has been very slow to improve and was complicated by a mild ileus, acute kidney injury and anasarca.  Plan at this point is to continue diuresis as tolerated, advance diet as per GI and general surgery, likely home, possibly with tube feeds, towards the end of the week  Assessment & Plan:   Principal Problem:   Pancreatitis, recurrent Active Problems:   TOBACCO ABUSE   Essential hypertension   Atrial fibrillation (HCC)   COPD (chronic obstructive pulmonary disease) (HCC)   S/P CABG x 3   Anemia   GERD (gastroesophageal reflux disease)   Hyperlipidemia   CAD (coronary artery disease)   Chronic kidney disease (CKD), stage IV (severe) (HCC)   Chronic diastolic congestive heart failure (HCC)   Anemia due to GI blood loss   Type II diabetes mellitus with renal manifestations (HCC)   SIRS (systemic inflammatory response syndrome) (HCC)   Protein-calorie malnutrition, severe   Anasarca   Demand ischemia (HCC)   Hyperkalemia   AKI (acute kidney injury) ([place])   Goals of care, counseling/discussion   Palliative care by specialist   Hematemesis   Pressure injury of skin   GI bleed, Acute Blood loss anemia;  She was hypotensive and hb decreased to 5.0--6 Transfer to ICU, CCM consulted.  Received IV fluids, and 3 units PRBC.  Hb this am at 7. Will plan to transfuse one unit. Will give IV lasix.  On IV protonix Gtt, Carafate started.  Hold plavix for 24 to 48 hours.   Acute pancreatitis, recurrent vs unresolved. Developing pseudocyst.  Had  tube  feeding, below ligament of treitz. Tube feeding removed today, it cause esophageal ulceration.  S/P cholecystectomy 8-14 Management per GI.   Plan to resume clear diet.    Mild Hyperkalemia;  She will received IV  lasix.   Severe protein caloric malnutrition;  Tube feeding removed.  Clear diet.   Mild ileus; resolved. Sx was following.    AKI on CKD stage IV;  She will received IV lasix with blood transfusion.  Will resume oral lasix tonight.  Nephrology will follow up on patient today.   Anasarca secondary to hypoalbuminemia, aggressive volume resuscitation Received IV lasix.   Right upper extremity with worsening edema, redness, Cellulitis Repeated  Doppler showed superficial cephalic vein thrombosis. Local care. . Started doxy to treat for cellulitis, received 2 days.   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  Colonoscopy planned October 28.  Was not iron deficient August.  A fib; stable.  Resume carvedilol.   COPD, chronic hypoxic respiratory failure on oxygen at home --Remains stable.  Demand ischemia.  PMH CAD, CABG. --Cardiology recommended medical management, consider outpatient stress test.    DVT prophylaxis: heparin  Code Status: full code.  Family Communication: care discussed with patient.  Disposition Plan: resume carvedilol, Blood transfusion, continue to monitor closely   Consultants:   Sx  GI  Nephrology    Procedures:  cortrak feeding tube placement on 9/23  Transfusion 1 unit PRBC Echo; Normal LVF with mild BSH, mild MR and TR, mild LVE, grade 3 diastolic dysfunction.. S/P  mitral valve repair with moderate MS. No significant change from prior echo The right ventricular systolic pressure was increased consistent with mild pulmonary  hypertension.   Antimicrobials: merrem.    Subjective: She is sleepy, just came from endoscopy.  She is breathing ok.    Objective: Vitals:   06/16/18 0800  06/16/18 0808 06/16/18 1025 06/16/18 1030  BP: (!) 139/53 (!) 149/58 128/73 125/72  Pulse: (!) 106 (!) 105 (!) 104 (!) 102  Resp: 12 10 (!) 25 (!) 24  Temp:  98.6 F (37 C) (!) 97 F (36.1 C) (!) 97.2 F (36.2 C)  TempSrc:  Oral    SpO2: 100% 100% 95% 98%  Weight:      Height:        Intake/Output Summary (Last 24 hours) at 06/16/2018 1117 Last data filed at 06/16/2018 1032 Gross per 24 hour  Intake 1648.55 ml  Output 900 ml  Net 748.55 ml   Filed Weights   06/12/18 2041 06/15/18 2000 06/16/18 0311  Weight: 67.2 kg 70.6 kg 68.4 kg    Examination:  General exam: Sleepy  Respiratory system: Crackles bases.  Cardiovascular system: S 1, S 2 IRR Gastrointestinal system: BS present, soft, mild tenderness.  Central nervous system: sleepy  Extremities:  Trace edema Skin: mild rash left arm Psychiatry: Mood and affect appropriate.   Data Reviewed: I have personally reviewed following labs and imaging studies  CBC: Recent Labs  Lab 06/12/18 0422 06/14/18 0818 06/15/18 0456 06/15/18 0837 06/15/18 1655 06/15/18 2251 06/16/18 0433  WBC 13.5* 15.0* 11.7*  --   --   --   --   HGB 8.5* 7.9* 6.1* 5.7* 8.9* 7.8* 7.2*  HCT 27.6* 26.2* 20.5* 19.4* 28.0* 23.7* 21.8*  MCV 101.1* 100.4* 102.5*  --   --   --   --   PLT 352 348 332  --   --   --   --    Basic Metabolic Panel: Recent Labs  Lab 06/10/18 0628  06/12/18 0422 06/13/18 0526 06/14/18 0818 06/15/18 0456 06/16/18 0433  NA 138   < > 139 140 138 142 144  K 5.1   < > 5.4* 5.4* 4.7 5.2* 5.4*  CL 96*   < > 100 103 103 100 106  CO2 29   < > 28 24 28  32 29  GLUCOSE 123*   < > 84 82 163* 191* 141*  BUN 62*   < > 59* 57* 61* 78* 119*  CREATININE 2.63*   < > 2.28* 2.32* 2.02* 1.87* 1.99*  CALCIUM 9.3   < > 9.1 9.0 8.8* 8.6* 8.2*  MG  --   --  1.9 2.1  --   --   --   PHOS 6.5*  --  6.0* 5.6*  --   --   --    < > = values in this interval not displayed.   GFR: Estimated Creatinine Clearance: 27.4 mL/min (A) (by C-G  formula based on SCr of 1.99 mg/dL (H)). Liver Function Tests: Recent Labs  Lab 06/10/18 0628 06/12/18 0422  AST  --  19  ALT  --  9  ALKPHOS  --  35*  BILITOT  --  1.0  PROT  --  5.2*  ALBUMIN 2.0* 2.1*   Recent Labs  Lab 06/10/18 0628 06/11/18 0444 06/12/18 0422  LIPASE 205* 1,307* 140*   No results for input(s): AMMONIA in the last 168 hours. Coagulation Profile: Recent Labs  Lab 06/15/18 0837  INR  1.28   Cardiac Enzymes: No results for input(s): CKTOTAL, CKMB, CKMBINDEX, TROPONINI in the last 168 hours. BNP (last 3 results) No results for input(s): PROBNP in the last 8760 hours. HbA1C: No results for input(s): HGBA1C in the last 72 hours. CBG: Recent Labs  Lab 06/15/18 1633 06/15/18 1948 06/15/18 2326 06/16/18 0342 06/16/18 0756  GLUCAP 129* 156* 141* 130* 144*   Lipid Profile: No results for input(s): CHOL, HDL, LDLCALC, TRIG, CHOLHDL, LDLDIRECT in the last 72 hours. Thyroid Function Tests: No results for input(s): TSH, T4TOTAL, FREET4, T3FREE, THYROIDAB in the last 72 hours. Anemia Panel: No results for input(s): VITAMINB12, FOLATE, FERRITIN, TIBC, IRON, RETICCTPCT in the last 72 hours. Sepsis Labs: No results for input(s): PROCALCITON, LATICACIDVEN in the last 168 hours.  No results found for this or any previous visit (from the past 240 hour(s)).       Radiology Studies: Dg Chest Port 1 View  Result Date: 06/15/2018 CLINICAL DATA:  Shortness of breath EXAM: PORTABLE CHEST 1 VIEW COMPARISON:  05/28/2018 FINDINGS: Cardiac shadow is mildly prominent. Postsurgical changes are again seen. Feeding catheter is noted extending into the stomach. The lungs are well aerated bilaterally. Mild central vascular congestion with interstitial edema is seen. No sizable effusion is noted. IMPRESSION: Changes of mild CHF. Electronically Signed   By: Inez Catalina M.D.   On: 06/15/2018 14:12   Dg Abd Portable 1v  Result Date: 06/15/2018 CLINICAL DATA:  Vomiting.  EXAM: PORTABLE ABDOMEN - 1 VIEW COMPARISON:  Abdominal x-ray dated June 09, 2018. FINDINGS: Feeding tube tip has slightly retracted, now in the distal second portion of the duodenum. The bowel gas pattern is normal. Unchanged aorto bi-iliac stent graft and left renal artery stent. No acute osseous abnormality. IMPRESSION: 1. Slight retraction of the feeding tube with the tip now in the distal second portion of the duodenum. 2. Nonobstructive bowel gas pattern. Electronically Signed   By: Titus Dubin M.D.   On: 06/15/2018 11:21        Scheduled Meds: . ALPRAZolam  0.5 mg Oral QHS  . cholecalciferol  5,000 Units Oral Daily  . doxycycline  100 mg Per Tube Q12H  . DULoxetine  60 mg Oral Daily  . gabapentin  300 mg Oral BID  . insulin aspart  0-9 Units Subcutaneous Q4H  . lipase/protease/amylase  12,000 Units Oral TID AC  . lipase/protease/amylase)  20,880 Units Per Tube Once   And  . sodium bicarbonate  650 mg Per Tube Once  . mouth rinse  15 mL Mouth Rinse BID  . mirtazapine  7.5 mg Oral QHS  . mometasone-formoterol  2 puff Inhalation BID  . [START ON 06/18/2018] pantoprazole  40 mg Intravenous Q12H  . sucralfate  1 g Oral TID WC & HS  . vitamin B-12  100 mcg Per Tube Daily   Continuous Infusions: . sodium chloride 250 mL (06/16/18 0821)  . sodium chloride    . pantoprozole (PROTONIX) infusion Stopped (06/16/18 0754)     LOS: 19 days    Time spent: 35 minutes./     Elmarie Shiley, MD [facility] Pager [phone]  If 7PM-7AM, please contact night-coverage [email or web address] Password [email or web address] 06/16/2018, 11:17 AM

## 2018-06-17 ENCOUNTER — Encounter (HOSPITAL_COMMUNITY): Payer: Self-pay | Admitting: Gastroenterology

## 2018-06-17 ENCOUNTER — Inpatient Hospital Stay (HOSPITAL_COMMUNITY): Payer: Medicare Other

## 2018-06-17 DIAGNOSIS — K2211 Ulcer of esophagus with bleeding: Secondary | ICD-10-CM

## 2018-06-17 LAB — BASIC METABOLIC PANEL
ANION GAP: 12 (ref 5–15)
BUN: 133 mg/dL — ABNORMAL HIGH (ref 8–23)
CALCIUM: 8.6 mg/dL — AB (ref 8.9–10.3)
CO2: 25 mmol/L (ref 22–32)
Chloride: 105 mmol/L (ref 98–111)
Creatinine, Ser: 2.3 mg/dL — ABNORMAL HIGH (ref 0.44–1.00)
GFR, EST AFRICAN AMERICAN: 24 mL/min — AB (ref >60)
GFR, EST NON AFRICAN AMERICAN: 21 mL/min — AB (ref >60)
Glucose, Bld: 150 mg/dL — ABNORMAL HIGH (ref 70–99)
Potassium: 5 mmol/L (ref 3.5–5.1)
SODIUM: 142 mmol/L (ref 135–145)

## 2018-06-17 LAB — URINALYSIS, ROUTINE W REFLEX MICROSCOPIC
BILIRUBIN URINE: NEGATIVE
Glucose, UA: NEGATIVE mg/dL
HGB URINE DIPSTICK: NEGATIVE
Ketones, ur: NEGATIVE mg/dL
Leukocytes, UA: NEGATIVE
Nitrite: NEGATIVE
PROTEIN: NEGATIVE mg/dL
Specific Gravity, Urine: 1.011 (ref 1.005–1.030)
pH: 5 (ref 5.0–8.0)

## 2018-06-17 LAB — CBC
HCT: 23.4 % — ABNORMAL LOW (ref 36.0–46.0)
Hemoglobin: 7.4 g/dL — ABNORMAL LOW (ref 12.0–15.0)
MCH: 31.1 pg (ref 26.0–34.0)
MCHC: 31.6 g/dL (ref 30.0–36.0)
MCV: 98.3 fL (ref 80.0–100.0)
PLATELETS: 231 10*3/uL (ref 150–400)
RBC: 2.38 MIL/uL — ABNORMAL LOW (ref 3.87–5.11)
RDW: 17.8 % — AB (ref 11.5–15.5)
WBC: 18.1 10*3/uL — AB (ref 4.0–10.5)
nRBC: 0.9 % — ABNORMAL HIGH (ref 0.0–0.2)

## 2018-06-17 LAB — GLUCOSE, CAPILLARY
GLUCOSE-CAPILLARY: 117 mg/dL — AB (ref 70–99)
GLUCOSE-CAPILLARY: 140 mg/dL — AB (ref 70–99)
GLUCOSE-CAPILLARY: 141 mg/dL — AB (ref 70–99)
GLUCOSE-CAPILLARY: 99 mg/dL (ref 70–99)
Glucose-Capillary: 195 mg/dL — ABNORMAL HIGH (ref 70–99)
Glucose-Capillary: 143 mg/dL — ABNORMAL HIGH (ref 70–99)

## 2018-06-17 LAB — PREPARE RBC (CROSSMATCH)

## 2018-06-17 MED ORDER — FUROSEMIDE 10 MG/ML IJ SOLN: 20.0000 mg | Freq: Once | INTRAMUSCULAR | Status: DC

## 2018-06-17 MED ORDER — SODIUM CHLORIDE 0.9% IV SOLUTION: Freq: Once | INTRAVENOUS | Status: DC

## 2018-06-17 NOTE — Evaluation (Signed)
Occupational Therapy Evaluation Patient Details Name: [name] MRN: [id number] DOB: 06-02-1951 Today's Date: 06/17/2018    History of Present Illness Pt is a 67 y/o female with c/o of abdominal pain. Note recent hospitalization 04/09/18 to 04/21/18 with GI bleed, gallstone pancreatitis, and during which she received laparoscopic cholecystectomy. Diagnosed with recurrent pancreatitis. PMH AAA, CHF, chronic back pain, CKD, COPD, GI bleed, HTN, MI, A-fib, DM, cardiac cath, CABG, maze procedure, mitral valve repair, R femoral-popliteal bypass.  Pt's course was complicated by hypotension and vomiting blood.  She had a significant drop in Hgb into the 5s.  She required several units of PRBCs and was transferred to the ICU.  She underwent an EGD on 06/16/18 which revealed and 8 mm esophageal ulcer.     Clinical Impression   This 67 y/o female presents with the above. At baseline pt was performing ADLs and functional mobility with mod independence using RW. Pt presenting with generalized weakness and decreased activity tolerance. Pt currently requiring modA for bed mobility, minguard-minA for sitting balance EOB, minA UB ADL and maxA for LB ADLs. Further mobility deferred this session as pt with increased dizziness sitting EOB and not subsiding, VSS. Pt motivated to return to PLOF and with supportive family present throughout session. Pt will benefit from continued acute OT services and feel she will benefit from continued therapy services at CIR level prior to return home to maximize her overall safety and independence with ADLs and mobility. Will follow.     Follow Up Recommendations  CIR;Supervision/Assistance - 24 hour    Equipment Recommendations  Other (comment)(TBD in next venue)           Precautions / Restrictions Precautions Precautions: Fall;Other (comment) Precaution Comments: watch SPO2/HR Restrictions Weight Bearing Restrictions: No      Mobility Bed Mobility Overal bed  mobility: Needs Assistance Bed Mobility: Supine to Sit;Sit to Supine     Supine to sit: Mod assist Sit to supine: Mod assist   General bed mobility comments: Mod assist to support trunk and help progress legs to EOB.  Assist at hips to weight shift to scoot to EOB. Assist for LEs onto EOB when returning to supine   Transfers                      Balance Overall balance assessment: Needs assistance Sitting-balance support: Feet supported;Bilateral upper extremity supported Sitting balance-Leahy Scale: Fair Sitting balance - Comments: requires cues to maintain upright posture, tends to progress to forward flexion with fatigue                                   ADL either performed or assessed with clinical judgement   ADL Overall ADL's : Needs assistance/impaired Eating/Feeding: Set up;Bed level;Sitting   Grooming: Min guard;Sitting   Upper Body Bathing: Minimal assistance;Sitting   Lower Body Bathing: Sitting/lateral leans;Maximal assistance   Upper Body Dressing : Minimal assistance;Sitting   Lower Body Dressing: Maximal assistance;Sitting/lateral leans Lower Body Dressing Details (indicate cue type and reason): totalA to don socks at bed level this session       Toileting - Clothing Manipulation Details (indicate cue type and reason): pt with foley and rectal tube       General ADL Comments: pt performing bed mobility and sat EOB approx 5-7 min with minguard-minA during session; pt with reports of increased dizziness that does not subside with rest while  sitting therefore deferred mobility attempts due to safety concerns, VSS      Vision         Perception     Praxis      Pertinent Vitals/Pain Pain Assessment: Faces Faces Pain Scale: Hurts little more Pain Location: stomach; back Pain Descriptors / Indicators: Grimacing;Guarding;Sore Pain Intervention(s): Limited activity within patient's tolerance;Monitored during session     Hand  Dominance Right   Extremity/Trunk Assessment Upper Extremity Assessment Upper Extremity Assessment: Generalized weakness   Lower Extremity Assessment Lower Extremity Assessment: Defer to PT evaluation   Cervical / Trunk Assessment Cervical / Trunk Assessment: Kyphotic   Communication Communication Communication: No difficulties   Cognition Arousal/Alertness: Awake/alert Behavior During Therapy: Flat affect Overall Cognitive Status: Impaired/Different from baseline Area of Impairment: Orientation;Problem solving                 Orientation Level: Disoriented to;Time           Problem Solving: Slow processing;Decreased initiation General Comments: pt overall oriented, disoriented to month only; slower processing noted; daughter often answering for pt    General Comments  daughter and sister present during session    Exercises     Shoulder Instructions      Home Living Family/patient expects to be discharged to:: Private residence Living Arrangements: Alone Available Help at Discharge: Family(reports can be available 24hr initially) Type of Home: Apartment("parent suite" off of daughter's home) Home Access: Ramped entrance     Home Layout: One level     Bathroom Shower/Tub: Occupational psychologist: Standard     Home Equipment: Environmental consultant - 4 wheels;Bedside commode          Prior Functioning/Environment Level of Independence: Independent with assistive device(s)        Comments: using RW, per chart review hx of at least 1 fall; was completing ADLs without assist        OT Problem List: Decreased strength;Decreased range of motion;Decreased activity tolerance;Impaired balance (sitting and/or standing);Pain      OT Treatment/Interventions: Self-care/ADL training;Therapeutic exercise;Energy conservation;Therapeutic activities;Patient/family education;Balance training    OT Goals(Current goals can be found in the care plan section) Acute  Rehab OT Goals Patient Stated Goal: Hopes to be able to go home OT Goal Formulation: With patient Time For Goal Achievement: 07/01/18 Potential to Achieve Goals: Good  OT Frequency: Min 2X/week   Barriers to D/C:            Co-evaluation              AM-PAC PT "6 Clicks" Daily Activity     Outcome Measure Help from another person eating meals?: None Help from another person taking care of personal grooming?: A Little Help from another person toileting, which includes using toliet, bedpan, or urinal?: Total Help from another person bathing (including washing, rinsing, drying)?: A Lot Help from another person to put on and taking off regular upper body clothing?: A Little Help from another person to put on and taking off regular lower body clothing?: A Lot 6 Click Score: 15   End of Session Equipment Utilized During Treatment: Oxygen Nurse Communication: Mobility status  Activity Tolerance: Patient tolerated treatment well;Patient limited by fatigue(limited by dizziness) Patient left: in bed;with call bell/phone within reach;with bed alarm set;with family/visitor present  OT Visit Diagnosis: Muscle weakness (generalized) (M62.81)                Time: 0160-1093 OT Time Calculation (min): 36 min Charges:  OT General Charges $OT Visit: 1 Visit OT Evaluation $OT Eval Moderate Complexity: 1 Mod OT Treatments $Self Care/Home Management : 8-22 mins  Lou Cal, OT [facility] Pager [phone] Office [phone]   Raymondo Band 06/17/2018, 2:10 PM

## 2018-06-17 NOTE — Progress Notes (Signed)
[facility] Progress Note  1 Day Post-Op  Subjective: CC-  Feeling about the same as yesterday. Continues to have some mild upper abdominal pain. Denies n/v. Tolerating clear liquids. Per patient no external signs of bleeding. Hg 7.9 today from 11.4 and she did have one episode of hypotension over night, BP stable now.  Upper endoscopy yesterday revealed two non-bleeding esophageal ulcers with evidence for recent bleeding likely due to irritation from Cortrak.  Objective: Vital signs in last 24 hours: Temp:  [97 F (36.1 C)-98.2 F (36.8 C)] 97.7 F (36.5 C) (10/12 0819) Pulse Rate:  [92-106] 92 (10/12 0346) Resp:  [7-25] 7 (10/12 0346) BP: (86-176)/(39-122) 153/54 (10/12 0346) SpO2:  [87 %-100 %] 100 % (10/11 1850) Arterial Line BP: (154-234)/(44-71) 234/70 (10/11 1555) Last BM Date: 06/16/18  Intake/Output from previous day: 10/11 0701 - 10/12 0700 In: 1177.8 [I.V.:872.8; Blood:305] Out: 752 [Urine:601; Emesis/NG output:150; Stool:1] Intake/Output this shift: No intake/output data recorded.  PE: Gen:  Alert, NAD HEENT: EOM's intact, pupils equal and round Card:  RRR, HR 93 while I was in the room Pulm:  CTAB, no W/R/R, effort normal Abd: Soft, mild distension, +BS, mild TTP RUQ/epigastric region Skin: no rashes noted, warm and dry  Lab Results:  Recent Labs    06/15/18 0456  06/16/18 1919 06/17/18 0611  WBC 11.7*  --   --  18.2*  HGB 6.1*   < > 11.4* 7.9*  HCT 20.5*   < > 35.2* 25.0*  PLT 332  --   --  234   < > = values in this interval not displayed.   BMET Recent Labs    06/16/18 0433 06/17/18 0611  NA 144 142  K 5.4* 5.0  CL 106 105  CO2 29 25  GLUCOSE 141* 150*  BUN 119* 133*  CREATININE 1.99* 2.30*  CALCIUM 8.2* 8.6*   PT/INR Recent Labs    06/15/18 0837  LABPROT 15.9*  INR 1.28   CMP     Component Value Date/Time   NA 142 06/17/2018 0611   K 5.0 06/17/2018 0611   CL 105 06/17/2018 0611   CO2 25 06/17/2018 0611   GLUCOSE 150 (H) 06/17/2018 0611   BUN 133 (H) 06/17/2018 0611   CREATININE 2.30 (H) 06/17/2018 0611   CREATININE 1.32 (H) 06/17/2015 1257   CALCIUM 8.6 (L) 06/17/2018 0611   PROT 5.2 (L) 06/12/2018 0422   ALBUMIN 2.1 (L) 06/12/2018 0422   AST 19 06/12/2018 0422   ALT 9 06/12/2018 0422   ALKPHOS 35 (L) 06/12/2018 0422   BILITOT 1.0 06/12/2018 0422   GFRNONAA 21 (L) 06/17/2018 0611   GFRAA 24 (L) 06/17/2018 0611   Lipase     Component Value Date/Time   LIPASE 140 (H) 06/12/2018 0422       Studies/Results: Dg Chest Port 1 View  Result Date: 06/15/2018 CLINICAL DATA:  Shortness of breath EXAM: PORTABLE CHEST 1 VIEW COMPARISON:  05/28/2018 FINDINGS: Cardiac shadow is mildly prominent. Postsurgical changes are again seen. Feeding catheter is noted extending into the stomach. The lungs are well aerated bilaterally. Mild central vascular congestion with interstitial edema is seen. No sizable effusion is noted. IMPRESSION: Changes of mild CHF. Electronically Signed   By: Inez Catalina M.D.   On: 06/15/2018 14:12   Dg Abd Portable 1v  Result Date: 06/15/2018 CLINICAL DATA:  Vomiting. EXAM: PORTABLE ABDOMEN - 1 VIEW COMPARISON:  Abdominal x-ray dated June 09, 2018. FINDINGS: Feeding tube tip has slightly retracted, now  in the distal second portion of the duodenum. The bowel gas pattern is normal. Unchanged aorto bi-iliac stent graft and left renal artery stent. No acute osseous abnormality. IMPRESSION: 1. Slight retraction of the feeding tube with the tip now in the distal second portion of the duodenum. 2. Nonobstructive bowel gas pattern. Electronically Signed   By: Titus Dubin M.D.   On: 06/15/2018 11:21    Anti-infectives: Anti-infectives (From admission, onward)   Start     Dose/Rate Route Frequency Ordered Stop   06/14/18 1030  doxycycline (VIBRAMYCIN) 50 MG/5ML syrup 100 mg     100 mg Per Tube Every 12 hours 06/14/18 1028 06/16/18 2159   06/05/18 1445  meropenem (MERREM) 1 g  in sodium chloride 0.9 % 100 mL IVPB     1 g 200 mL/hr over 30 Minutes Intravenous Every 12 hours 06/05/18 1433 06/10/18 1019   05/29/18 0600  piperacillin-tazobactam (ZOSYN) IVPB 3.375 g     3.375 g 12.5 mL/hr over 240 Minutes Intravenous Every 8 hours 05/28/18 2227 06/05/18 1600   05/28/18 2230  piperacillin-tazobactam (ZOSYN) IVPB 3.375 g     3.375 g 100 mL/hr over 30 Minutes Intravenous  Once 05/28/18 2227 05/29/18 0011       Assessment/Plan New Hematemesis 10/11 - no n/v last 24 hours AKI on CKD Anasarca  Malnutrition Chronic diastolic HF Acute on chronic anemia  A.fib COPD on home O2 Diarrhea  DM2  Atherosclerosis  Severe necrotizing pancreatitis with pseudocyst - prior lap chole 04/19/18 Ileus - resolving Upper GI bleed  - s/p upper endoscopy 10/11 revealed two non-bleeding esophageal ulcers with evidence for recent bleeding likely due to irritation from [place] has been removed. Patient tolerating clears, ok to advance when ok with GI. Recommend continuing to hold Plavix. Continue to monitor hemoglobin, transfusions per primary.  ID - Zosyn 9/22-9/30, Merrem 9/30-10/5; Doxycycline 10/9 -05/17/18 FEN - IVF, CLD, Boost VTE - SCDs, no chemical DVT prophylaxis due to bleeding Foley - none   LOS: 20 days    Wellington Hampshire , [facility] 06/17/2018, 8:36 AM Pager: [phone] Mon 7:00 am -11:30 AM Tues-Fri 7:00 am-4:30 pm Sat-Sun 7:00 am-11:30 am

## 2018-06-17 NOTE — Progress Notes (Signed)
PROGRESS NOTE    [name]  PJK:[id number] DOB: May 10, 1951 DOA: 05/28/2018 PCP: Bernerd Limbo, MD    Brief Narrative: 67 year old woman PMH gallstone pancreatitis August 2019, status post laparoscopic cholecystectomy same hospitalization, presented with abdominal pain, nausea, vomiting.  Admitted for recurrent pancreatitis.  She was seen by gastroenterology, cortrak tube was placed and she was started on enteral feeds.  Continuation has been very slow to improve and was complicated by a mild ileus, acute kidney injury and anasarca.  Plan at this point is to continue diuresis as tolerated, advance diet as per GI and general surgery, likely home, possibly with tube feeds, towards the end of the week  Assessment & Plan:   Principal Problem:   Pancreatitis, recurrent Active Problems:   TOBACCO ABUSE   Essential hypertension   Atrial fibrillation (HCC)   COPD (chronic obstructive pulmonary disease) (HCC)   S/P CABG x 3   Anemia   GERD (gastroesophageal reflux disease)   Hyperlipidemia   CAD (coronary artery disease)   Chronic kidney disease (CKD), stage IV (severe) (HCC)   Chronic diastolic congestive heart failure (HCC)   Anemia due to GI blood loss   Type II diabetes mellitus with renal manifestations (HCC)   SIRS (systemic inflammatory response syndrome) (HCC)   Protein-calorie malnutrition, severe   Anasarca   Demand ischemia (HCC)   Hyperkalemia   AKI (acute kidney injury) ([place])   Goals of care, counseling/discussion   Palliative care by specialist   Hematemesis   Pressure injury of skin   GI bleed, Acute Blood loss anemia;  She was hypotensive and hb decreased to 5.0--6 Transfer to ICU, CCM consulted.  Received IV fluids, and 3 units PRBC last days.  On  protonix , Carafate started.  Hold plavix for 24 to 48 hours. 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 He relates we  can change plavix to aspirin when stable from bleeding point of view.  Repeat hb this afternoon, transfusion if hb decreasing.   HTN;  Restarted on carvedilol, imdur low dose.   Urine retention;  Place foley catheter to avoid worsening renal function.   Leukocytosis;  Check UA, clean.  Check chest x ray.  Afebrile.  Repeat labs in am.   Acute pancreatitis, recurrent vs unresolved. Developing pseudocyst.  Had  tube feeding, below ligament of treitz. Tube feeding removed today, it cause esophageal ulceration.  S/P cholecystectomy 8-14 Management per GI.   Advanced to full liquid diet    Mild Hyperkalemia;  Resolved.   Severe protein caloric malnutrition;  Tube feeding removed.  Full liquid diet.  Discussed with family option for TPN, they would like to hold on starting TPN.   Mild ileus; resolved. Sx was following.   AKI on CKD stage IV;  Nephrology following  Plan to hold lasix today.   Anasarca secondary to hypoalbuminemia, aggressive volume resuscitation Received IV lasix.   Right upper extremity with worsening edema, redness, Cellulitis Repeated  Doppler showed superficial cephalic vein thrombosis. Local care. . Started doxy to treat for cellulitis, received 2 days.   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  Colonoscopy planned October 28.  Was not iron deficient August.  A fib; stable.  Continue with carvedilol.   COPD, chronic hypoxic respiratory failure on oxygen at home --Remains stable.  Demand ischemia.  PMH CAD, CABG. --Cardiology recommended medical management, consider outpatient stress test.  DVT prophylaxis: SCD Code Status: full code.  Family Communication: care discussed with multiples family Disposition Plan: advance diet, monitor hb.    Consultants:   Sx  GI  Nephrology    Procedures:  cortrak feeding tube placement on 9/23  Transfusion 1 unit PRBC Echo; Normal LVF with mild BSH, mild MR and TR,  mild LVE, grade 3 diastolic dysfunction.. S/P mitral valve repair with moderate MS. No significant change from prior echo The right ventricular systolic pressure was increased consistent with mild pulmonary  hypertension.   Antimicrobials: merrem.    Subjective: She is more alert today, abdominal pain is not worse. Mild abdominal pain right side abdomen.  denies dyspnea.    Objective: Vitals:   06/17/18 0704 06/17/18 0746 06/17/18 0819 06/17/18 0853  BP: (!) 160/58 (!) 160/60  (!) 169/60  Pulse: 93 98  98  Resp: 14 11    Temp:   97.7 F (36.5 C)   TempSrc:   Oral   SpO2: 99% 97%    Weight:      Height:        Intake/Output Summary (Last 24 hours) at 06/17/2018 0945 Last data filed at 06/16/2018 1945 Gross per 24 hour  Intake 655 ml  Output 402 ml  Net 253 ml   Filed Weights   06/12/18 2041 06/15/18 2000 06/16/18 0311  Weight: 67.2 kg 70.6 kg 68.4 kg    Examination:  General exam: Alert.  Respiratory system: Crackles bases.  Cardiovascular system: S 1, S 2 IRR Gastrointestinal system: BS present, soft, mild tenderness Central nervous system: Alert.  Extremities: trace edema. Right arm with less redness Skin: left arm redness decreased.  Psychiatry: Mood and affect appropriate.   Data Reviewed: I have personally reviewed following labs and imaging studies  CBC: Recent Labs  Lab 06/12/18 0422 06/14/18 0818 06/15/18 0456  06/15/18 1655 06/15/18 2251 06/16/18 0433 06/16/18 1919 06/17/18 0611  WBC 13.5* 15.0* 11.7*  --   --   --   --   --  18.2*  HGB 8.5* 7.9* 6.1*   < > 8.9* 7.8* 7.2* 11.4* 7.9*  HCT 27.6* 26.2* 20.5*   < > 28.0* 23.7* 21.8* 35.2* 25.0*  MCV 101.1* 100.4* 102.5*  --   --   --   --   --  97.7  PLT 352 348 332  --   --   --   --   --  234   < > = values in this interval not displayed.   Basic Metabolic Panel: Recent Labs  Lab 06/12/18 0422 06/13/18 0526 06/14/18 0818 06/15/18 0456 06/16/18 0433 06/17/18 0611  NA 139  140 138 142 144 142  K 5.4* 5.4* 4.7 5.2* 5.4* 5.0  CL 100 103 103 100 106 105  CO2 28 24 28  32 29 25  GLUCOSE 84 82 163* 191* 141* 150*  BUN 59* 57* 61* 78* 119* 133*  CREATININE 2.28* 2.32* 2.02* 1.87* 1.99* 2.30*  CALCIUM 9.1 9.0 8.8* 8.6* 8.2* 8.6*  MG 1.9 2.1  --   --   --   --   PHOS 6.0* 5.6*  --   --   --   --    GFR: Estimated Creatinine Clearance: 23.7 mL/min (A) (by C-G formula based on SCr of 2.3 mg/dL (H)). Liver Function Tests: Recent Labs  Lab 06/12/18 0422  AST 19  ALT 9  ALKPHOS 35*  BILITOT 1.0  PROT 5.2*  ALBUMIN 2.1*   Recent Labs  Lab  06/11/18 0444 06/12/18 0422  LIPASE 1,307* 140*   No results for input(s): AMMONIA in the last 168 hours. Coagulation Profile: Recent Labs  Lab 06/15/18 0837  INR 1.28   Cardiac Enzymes: No results for input(s): CKTOTAL, CKMB, CKMBINDEX, TROPONINI in the last 168 hours. BNP (last 3 results) No results for input(s): PROBNP in the last 8760 hours. HbA1C: No results for input(s): HGBA1C in the last 72 hours. CBG: Recent Labs  Lab 06/16/18 1609 06/16/18 2044 06/17/18 0055 06/17/18 0406 06/17/18 0807  GLUCAP 131* 135* 140* 117* 141*   Lipid Profile: No results for input(s): CHOL, HDL, LDLCALC, TRIG, CHOLHDL, LDLDIRECT in the last 72 hours. Thyroid Function Tests: No results for input(s): TSH, T4TOTAL, FREET4, T3FREE, THYROIDAB in the last 72 hours. Anemia Panel: No results for input(s): VITAMINB12, FOLATE, FERRITIN, TIBC, IRON, RETICCTPCT in the last 72 hours. Sepsis Labs: No results for input(s): PROCALCITON, LATICACIDVEN in the last 168 hours.  No results found for this or any previous visit (from the past 240 hour(s)).       Radiology Studies: Dg Chest Port 1 View  Result Date: 06/15/2018 CLINICAL DATA:  Shortness of breath EXAM: PORTABLE CHEST 1 VIEW COMPARISON:  05/28/2018 FINDINGS: Cardiac shadow is mildly prominent. Postsurgical changes are again seen. Feeding catheter is noted extending into  the stomach. The lungs are well aerated bilaterally. Mild central vascular congestion with interstitial edema is seen. No sizable effusion is noted. IMPRESSION: Changes of mild CHF. Electronically Signed   By: Inez Catalina M.D.   On: 06/15/2018 14:12   Dg Abd Portable 1v  Result Date: 06/15/2018 CLINICAL DATA:  Vomiting. EXAM: PORTABLE ABDOMEN - 1 VIEW COMPARISON:  Abdominal x-ray dated June 09, 2018. FINDINGS: Feeding tube tip has slightly retracted, now in the distal second portion of the duodenum. The bowel gas pattern is normal. Unchanged aorto bi-iliac stent graft and left renal artery stent. No acute osseous abnormality. IMPRESSION: 1. Slight retraction of the feeding tube with the tip now in the distal second portion of the duodenum. 2. Nonobstructive bowel gas pattern. Electronically Signed   By: Titus Dubin M.D.   On: 06/15/2018 11:21        Scheduled Meds: . ALPRAZolam  0.5 mg Oral QHS  . carvedilol  12.5 mg Oral BID WC  . cholecalciferol  5,000 Units Oral Daily  . darbepoetin (ARANESP) injection - NON-DIALYSIS  60 mcg Subcutaneous Q Fri-1800  . DULoxetine  60 mg Oral Daily  . feeding supplement  1 Container Oral TID BM  . gabapentin  300 mg Oral BID  . insulin aspart  0-9 Units Subcutaneous Q4H  . isosorbide mononitrate  15 mg Oral Daily  . lipase/protease/amylase  12,000 Units Oral TID AC  . lipase/protease/amylase)  20,880 Units Per Tube Once   And  . sodium bicarbonate  650 mg Per Tube Once  . mouth rinse  15 mL Mouth Rinse BID  . mirtazapine  7.5 mg Oral QHS  . mometasone-formoterol  2 puff Inhalation BID  . pantoprazole sodium  40 mg Oral BID  . sucralfate  1 g Oral TID WC & HS  . vitamin B-12  100 mcg Per Tube Daily   Continuous Infusions: . sodium chloride 250 mL (06/16/18 0821)  . sodium chloride    . sodium chloride Stopped (06/17/18 0000)     LOS: 20 days    Time spent: 30 minutes./     Elmarie Shiley, MD [facility] Pager  [phone]  If 7PM-7AM,  please contact night-coverage [email or web address] Password [email or web address] 06/17/2018, 9:45 AM

## 2018-06-17 NOTE — Progress Notes (Signed)
Informed Dr. Tyrell Antonio that's patients bladder scan shows 458ml in bladder. Per MD replace foley cath.

## 2018-06-17 NOTE — Progress Notes (Signed)
[facility] Progress Note   Subjective:     Out of the ICU now  Serum creatinine increased to 2.3, potassium 5.0, bicarbonate 25; hemoglobin 7.9 and stable this morning  Patient more awake and alert this morning  At least 0.6 L urine yesterday  Afebrile, blood pressure is mildly elevated  Family present, updated  Objective Vitals:   06/17/18 0704 06/17/18 0746 06/17/18 0819 06/17/18 0853  BP: (!) 160/58 (!) 160/60  (!) 169/60  Pulse: 93 98  98  Resp: 14 11    Temp:   97.7 F (36.5 C)   TempSrc:   Oral   SpO2: 99% 97%    Weight:      Height:       Physical Exam General: chronically ill appearing Heart: no rub, RRR Lungs: normal WOB, dec BS bases Abdomen: soft, mildly TTP epigastrium Extremities:  Anasarca 4 limbs, 2+ pitting; LUE AVF +t/b  Additional Objective Labs: Basic Metabolic Panel: Recent Labs  Lab 06/12/18 0422 06/13/18 0526  06/15/18 0456 06/16/18 0433 06/17/18 0611  NA 139 140   < > 142 144 142  K 5.4* 5.4*   < > 5.2* 5.4* 5.0  CL 100 103   < > 100 106 105  CO2 28 24   < > 32 29 25  GLUCOSE 84 82   < > 191* 141* 150*  BUN 59* 57*   < > 78* 119* 133*  CREATININE 2.28* 2.32*   < > 1.87* 1.99* 2.30*  CALCIUM 9.1 9.0   < > 8.6* 8.2* 8.6*  PHOS 6.0* 5.6*  --   --   --   --    < > = values in this interval not displayed.   Liver Function Tests: Recent Labs  Lab 06/12/18 0422  AST 19  ALT 9  ALKPHOS 35*  BILITOT 1.0  PROT 5.2*  ALBUMIN 2.1*   Recent Labs  Lab 06/11/18 0444 06/12/18 0422  LIPASE 1,307* 140*   CBC: Recent Labs  Lab 06/12/18 0422 06/14/18 0818 06/15/18 0456  06/16/18 0433 06/16/18 1919 06/17/18 0611  WBC 13.5* 15.0* 11.7*  --   --   --  18.2*  HGB 8.5* 7.9* 6.1*   < > 7.2* 11.4* 7.9*  HCT 27.6* 26.2* 20.5*   < > 21.8* 35.2* 25.0*  MCV 101.1* 100.4* 102.5*  --   --   --  97.7  PLT 352 348 332  --   --   --  234   < > = values in this interval not displayed.   Blood Culture    Component Value  Date/Time   SDES BLOOD RIGHT HAND 06/05/2018 1453   SPECREQUEST  06/05/2018 1453    BOTTLES DRAWN AEROBIC ONLY Blood Culture adequate volume   CULT  06/05/2018 1453    NO GROWTH 5 DAYS Performed at [facility], [place] [address] [place], [place] [address]    REPTSTATUS 06/10/2018 FINAL 06/05/2018 1453    Cardiac Enzymes: No results for input(s): CKTOTAL, CKMB, CKMBINDEX, TROPONINI in the last 168 hours. CBG: Recent Labs  Lab 06/16/18 1609 06/16/18 2044 06/17/18 0055 06/17/18 0406 06/17/18 0807  GLUCAP 131* 135* 140* 117* 141*   Iron Studies: No results for input(s): IRON, TIBC, TRANSFERRIN, FERRITIN in the last 72 hours. @lablastinr3 @ Studies/Results: Dg Chest Port 1 View  Result Date: 06/15/2018 CLINICAL DATA:  Shortness of breath EXAM: PORTABLE CHEST 1 VIEW COMPARISON:  05/28/2018 FINDINGS: Cardiac shadow is mildly prominent. Postsurgical changes are  again seen. Feeding catheter is noted extending into the stomach. The lungs are well aerated bilaterally. Mild central vascular congestion with interstitial edema is seen. No sizable effusion is noted. IMPRESSION: Changes of mild CHF. Electronically Signed   By: Inez Catalina M.D.   On: 06/15/2018 14:12   Dg Abd Portable 1v  Result Date: 06/15/2018 CLINICAL DATA:  Vomiting. EXAM: PORTABLE ABDOMEN - 1 VIEW COMPARISON:  Abdominal x-ray dated June 09, 2018. FINDINGS: Feeding tube tip has slightly retracted, now in the distal second portion of the duodenum. The bowel gas pattern is normal. Unchanged aorto bi-iliac stent graft and left renal artery stent. No acute osseous abnormality. IMPRESSION: 1. Slight retraction of the feeding tube with the tip now in the distal second portion of the duodenum. 2. Nonobstructive bowel gas pattern. Electronically Signed   By: Titus Dubin M.D.   On: 06/15/2018 11:21   Medications: . sodium chloride 250 mL (06/16/18 0821)  . sodium chloride    . sodium chloride Stopped (06/17/18 0000)   .  ALPRAZolam  0.5 mg Oral QHS  . carvedilol  12.5 mg Oral BID WC  . cholecalciferol  5,000 Units Oral Daily  . darbepoetin (ARANESP) injection - NON-DIALYSIS  60 mcg Subcutaneous Q Fri-1800  . DULoxetine  60 mg Oral Daily  . feeding supplement  1 Container Oral TID BM  . gabapentin  300 mg Oral BID  . insulin aspart  0-9 Units Subcutaneous Q4H  . isosorbide mononitrate  15 mg Oral Daily  . lipase/protease/amylase  12,000 Units Oral TID AC  . lipase/protease/amylase)  20,880 Units Per Tube Once   And  . sodium bicarbonate  650 mg Per Tube Once  . mouth rinse  15 mL Mouth Rinse BID  . mirtazapine  7.5 mg Oral QHS  . mometasone-formoterol  2 puff Inhalation BID  . pantoprazole sodium  40 mg Oral BID  . sucralfate  1 g Oral TID WC & HS  . vitamin B-12  100 mcg Per Tube Daily    Assessment/Plan: 1.  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  Followed by Posey Pronto at [facility].  Has LUV AVF.  Renal function mildly worsened in the setting of recent gastrointestinal hemorrhage.  At this point would encourage oral intake.  Would hold diuretics for another 24 hours.  We will continue to follow.  2.  HTN: labile, CTM; resume amlodipine if remains elevated  3.  Hyperkalemia: Potassium remains mildly elevated.  Would continue to monitor at the current time.  Would use Veltassa or Lokelma, if necessary for potassium greater than 5.7.  4.  Anemia:   Recent acute blood loss anemia.  Received 40 mcg of Aranesp on 10/11.  For now continue weekly  Pearson Grippe MD 06/17/2018, 9:45 AM  [facility]

## 2018-06-17 NOTE — Progress Notes (Signed)
Manual BP check in R arm 170/62. Transferred monitor BP cuff from R leg to R arm and BP was 165/59. 250 cc fluid bolus held. Pt not in any apparent distress. MD notified. Will continue to monitor.

## 2018-06-17 NOTE — Progress Notes (Signed)
Informed Patient and family that 1 unit of blood was ordered. Per family Dr. Tarri Glenn with GI did not think she needed blood. Family asked this RN to speak with GI and then Dr. Tyrell Antonio. Per Dr. Tarri Glenn with GI she does not think patients needs another unit of blood since no active bleeding was shown on EGD. This RN relayed this information to Dr. Tyrell Antonio. Per Dr. Tyrell Antonio D/C blood transfusion and IV lasix.

## 2018-06-17 NOTE — Progress Notes (Signed)
Daily Rounding Note  06/17/2018, 11:18 AM  LOS: 20 days   SUBJECTIVE:   Chief complaint: Hematemesis.  Frequent dark stools overnight, rectal tube/pouch placed.   Pain in upper abdomen when she sits up, no sign pain when laying back.   No nausea.  No pain with swallowing.    OBJECTIVE:         Vital signs in last 24 hours:    Temp:  [97.6 F (36.4 C)-98.2 F (36.8 C)] 97.7 F (36.5 C) (10/12 0819) Pulse Rate:  [92-106] 98 (10/12 0853) Resp:  [7-20] 11 (10/12 0746) BP: (86-176)/(39-122) 169/60 (10/12 0853) SpO2:  [87 %-100 %] 97 % (10/12 0746) Arterial Line BP: (203-234)/(53-71) 234/70 (10/11 1555) Last BM Date: 06/16/18 Filed Weights   06/12/18 2041 06/15/18 2000 06/16/18 0311  Weight: 67.2 kg 70.6 kg 68.4 kg   General: Still looks pale and ill but overall better.  More alert, affect slightly improved. Heart: RRR. Chest: Clear bilaterally.  No labored breathing. Abdomen: Soft.  Mild to moderate tenderness in the epigastrium, right upper quadrant.  No guarding or rebound.  Normal, active bowel sounds. Extremities: Lower extremity edema. Neuro/Psych: Pleasant, alert, fully oriented.  Not nearly as drowsy as she has been on previous exams.  Intake/Output from previous day: 10/11 0701 - 10/12 0700 In: 1177.8 [I.V.:872.8; Blood:305] Out: 752 [Urine:601; Emesis/NG output:150; Stool:1]  Intake/Output this shift: Total I/O In: 180 [P.O.:180] Out: -   Lab Results: Recent Labs    06/15/18 0456  06/16/18 0433 06/16/18 1919 06/17/18 0611  WBC 11.7*  --   --   --  18.2*  HGB 6.1*   < > 7.2* 11.4* 7.9*  HCT 20.5*   < > 21.8* 35.2* 25.0*  PLT 332  --   --   --  234   < > = values in this interval not displayed.   BMET Recent Labs    06/15/18 0456 06/16/18 0433 06/17/18 0611  NA 142 144 142  K 5.2* 5.4* 5.0  CL 100 106 105  CO2 32 29 25  GLUCOSE 191* 141* 150*  BUN 78* 119* 133*  CREATININE 1.87* 1.99*  2.30*  CALCIUM 8.6* 8.2* 8.6*   LFT No results for input(s): PROT, ALBUMIN, AST, ALT, ALKPHOS, BILITOT, BILIDIR, IBILI in the last 72 hours. PT/INR Recent Labs    06/15/18 0837  LABPROT 15.9*  INR 1.28   Hepatitis Panel No results for input(s): HEPBSAG, HCVAB, HEPAIGM, HEPBIGM in the last 72 hours.  Studies/Results: Dg Chest Port 1 View  Result Date: 06/15/2018 CLINICAL DATA:  Shortness of breath EXAM: PORTABLE CHEST 1 VIEW COMPARISON:  05/28/2018 FINDINGS: Cardiac shadow is mildly prominent. Postsurgical changes are again seen. Feeding catheter is noted extending into the stomach. The lungs are well aerated bilaterally. Mild central vascular congestion with interstitial edema is seen. No sizable effusion is noted. IMPRESSION: Changes of mild CHF. Electronically Signed   By: Inez Catalina M.D.   On: 06/15/2018 14:12    Scheduled Meds: . ALPRAZolam  0.5 mg Oral QHS  . carvedilol  12.5 mg Oral BID WC  . cholecalciferol  5,000 Units Oral Daily  . darbepoetin (ARANESP) injection - NON-DIALYSIS  60 mcg Subcutaneous Q Fri-1800  . DULoxetine  60 mg Oral Daily  . feeding supplement  1 Container Oral TID BM  . gabapentin  300 mg Oral BID  . insulin aspart  0-9 Units Subcutaneous Q4H  . isosorbide mononitrate  15  mg Oral Daily  . lipase/protease/amylase  12,000 Units Oral TID AC  . lipase/protease/amylase)  20,880 Units Per Tube Once   And  . sodium bicarbonate  650 mg Per Tube Once  . mouth rinse  15 mL Mouth Rinse BID  . mirtazapine  7.5 mg Oral QHS  . mometasone-formoterol  2 puff Inhalation BID  . pantoprazole sodium  40 mg Oral BID  . sucralfate  1 g Oral TID WC & HS  . vitamin B-12  100 mcg Per Tube Daily   Continuous Infusions: . sodium chloride 250 mL (06/16/18 0821)  . sodium chloride    . sodium chloride Stopped (06/17/18 0000)   PRN Meds:.Place/Maintain arterial line **AND** sodium chloride, acetaminophen **OR** acetaminophen, alum & mag hydroxide-simeth, hydrALAZINE,  HYDROcodone-acetaminophen, HYDROmorphone (DILAUDID) injection, hydrOXYzine, levalbuterol, ondansetron (ZOFRAN) IV, phenol, polyethylene glycol   ASSESMENT:   *   Hematemesis. 06/16/2018 EGD revealed 2, nonbleeding esophageal ulcers likely due to core tract feeding tube.  There was clotted blood in the gastric cardia, fundus and body limiting full evaluation.  Clotted blood at the tip of the Dobbhoff was removed endoscopically and the tube was removed.  *    Acute, complicated pancreatitis with pseudocyst, necrosis.  Initial presentation in August/2019 felt to be biliary pancreatitis at the time Laparoscopic cholecystectomy 04/19/2018.  *    Protein calorie malnutrition.  Given the EGD findings, should not have replacement of the core tract feeding tube.  *    Blood loss anemia, So far this admission she is received 5 U PRBCs, 4 of these in the last 3 days, latest transfusion 10/11  *  Diarrhea.  Rectal tube and pouch placed overnight. Stool in tubing is black, no stool in actual pouch.  Need to observe and remove once BM's lessen.    *   Chronic Plavix, on hold.  Indications are peripheral vascular disease, AAA stent graft repair 01/2018.   PLAN   *   Diet to Full liquids.   Continue  Protonix 40 mg po bid, Carafate susp AC/at bedtime..  CBC in AM.  Remove the rectal tube when stool frequency decreases.    [name]  06/17/2018, 11:18 AM Phone [phone]

## 2018-06-18 DIAGNOSIS — R29898 Other symptoms and signs involving the musculoskeletal system: Secondary | ICD-10-CM

## 2018-06-18 DIAGNOSIS — R4189 Other symptoms and signs involving cognitive functions and awareness: Secondary | ICD-10-CM

## 2018-06-18 LAB — BASIC METABOLIC PANEL
ANION GAP: 8 (ref 5–15)
BUN: 121 mg/dL — AB (ref 8–23)
CHLORIDE: 104 mmol/L (ref 98–111)
CO2: 25 mmol/L (ref 22–32)
Calcium: 8.5 mg/dL — ABNORMAL LOW (ref 8.9–10.3)
Creatinine, Ser: 2.17 mg/dL — ABNORMAL HIGH (ref 0.44–1.00)
GFR, EST AFRICAN AMERICAN: 26 mL/min — AB (ref >60)
GFR, EST NON AFRICAN AMERICAN: 23 mL/min — AB (ref >60)
GLUCOSE: 119 mg/dL — AB (ref 70–99)
POTASSIUM: 4.2 mmol/L (ref 3.5–5.1)
SODIUM: 137 mmol/L (ref 135–145)

## 2018-06-18 LAB — CBC
HCT: 25 % — ABNORMAL LOW (ref 36.0–46.0)
HCT: 23.3 % — ABNORMAL LOW (ref 36.0–46.0)
HEMOGLOBIN: 7.9 g/dL — AB (ref 12.0–15.0)
HEMOGLOBIN: 7.3 g/dL — AB (ref 12.0–15.0)
MCH: 30.9 pg (ref 26.0–34.0)
MCH: 31.5 pg (ref 26.0–34.0)
MCHC: 31.6 g/dL (ref 30.0–36.0)
MCHC: 31.3 g/dL (ref 30.0–36.0)
MCV: 97.7 fL (ref 80.0–100.0)
MCV: 100.4 fL — ABNORMAL HIGH (ref 80.0–100.0)
PLATELETS: 234 10*3/uL (ref 150–400)
Platelets: 231 10*3/uL (ref 150–400)
RBC: 2.56 MIL/uL — ABNORMAL LOW (ref 3.87–5.11)
RBC: 2.32 MIL/uL — AB (ref 3.87–5.11)
RDW: 17.7 % — AB (ref 11.5–15.5)
RDW: 17.8 % — ABNORMAL HIGH (ref 11.5–15.5)
WBC: 18.2 10*3/uL — AB (ref 4.0–10.5)
WBC: 16.3 10*3/uL — AB (ref 4.0–10.5)
nRBC: 0.8 % — ABNORMAL HIGH (ref 0.0–0.2)
nRBC: 0.6 % — ABNORMAL HIGH (ref 0.0–0.2)

## 2018-06-18 LAB — GLUCOSE, CAPILLARY
GLUCOSE-CAPILLARY: 102 mg/dL — AB (ref 70–99)
GLUCOSE-CAPILLARY: 117 mg/dL — AB (ref 70–99)
GLUCOSE-CAPILLARY: 169 mg/dL — AB (ref 70–99)
Glucose-Capillary: 124 mg/dL — ABNORMAL HIGH (ref 70–99)
Glucose-Capillary: 180 mg/dL — ABNORMAL HIGH (ref 70–99)
Glucose-Capillary: 169 mg/dL — ABNORMAL HIGH (ref 70–99)

## 2018-06-18 MED ORDER — GABAPENTIN 100 MG PO CAPS
200.0000 mg | ORAL_CAPSULE | Freq: Two times a day (BID) | ORAL | Status: DC
  Administered 2018-06-18 – 2018-06-26 (×16): 200 mg via ORAL
  Filled 2018-06-18 (×16): qty 2

## 2018-06-18 MED ORDER — FUROSEMIDE 80 MG PO TABS
80.0000 mg | ORAL_TABLET | Freq: Every day | ORAL | Status: DC
  Administered 2018-06-18 – 2018-06-21 (×4): 80 mg via ORAL
  Filled 2018-06-18 (×4): qty 1

## 2018-06-18 MED ORDER — ASPIRIN EC 81 MG PO TBEC
81.0000 mg | DELAYED_RELEASE_TABLET | Freq: Every day | ORAL | Status: DC
  Administered 2018-06-18 – 2018-06-19 (×2): 81 mg via ORAL
  Filled 2018-06-18 (×2): qty 1

## 2018-06-18 NOTE — Progress Notes (Signed)
PROGRESS NOTE    [name]  ZSW:[id number] DOB: 1951/08/09 DOA: 05/28/2018 PCP: Bernerd Limbo, MD    Brief Narrative: 67 year old woman PMH gallstone pancreatitis August 2019, status post laparoscopic cholecystectomy same hospitalization, presented with abdominal pain, nausea, vomiting.  Admitted for recurrent pancreatitis.  She was seen by gastroenterology, cortrak tube was placed and she was started on enteral feeds.  Continuation has been very slow to improve and was complicated by a mild ileus, acute kidney injury and anasarca.  Plan at this point is to continue diuresis as tolerated, advance diet as per GI and general surgery, likely home, possibly with tube feeds, towards the end of the week  Assessment & Plan:   Principal Problem:   Pancreatitis, recurrent Active Problems:   TOBACCO ABUSE   Essential hypertension   Atrial fibrillation (HCC)   COPD (chronic obstructive pulmonary disease) (HCC)   S/P CABG x 3   Anemia   GERD (gastroesophageal reflux disease)   Hyperlipidemia   CAD (coronary artery disease)   Chronic kidney disease (CKD), stage IV (severe) (HCC)   Chronic diastolic congestive heart failure (HCC)   Anemia due to GI blood loss   Type II diabetes mellitus with renal manifestations (HCC)   SIRS (systemic inflammatory response syndrome) (HCC)   Protein-calorie malnutrition, severe   Anasarca   Demand ischemia (HCC)   Hyperkalemia   AKI (acute kidney injury) ([place])   Goals of care, counseling/discussion   Palliative care by specialist   Hematemesis   Pressure injury of skin   Esophageal ulcer with bleeding   GI bleed, Acute Blood loss anemia;  She was hypotensive and hb decreased to 5.0--6 Transfer to ICU, CCM consulted.  Received IV fluids, and 3 units PRBC last days.  On  protonix , Carafate started.  Hold plavix for 24 to 48 hours. 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 He relates we can change plavix to aspirin when stable from bleeding point of view.  Hb stable at 7. Didn't received blood transfusion yesterday ( family and GI decision).  Ok to resume plavix per GI, after discussion with Dr Donzetta Matters yesterday will resume aspirin tomorrow.   HTN;  Continue with carvedilol, imdur low dose.   Urine retention;  Place foley catheter to avoid worsening renal function.  Will need voiding trial/   Leukocytosis;  Check UA, clean.  Check chest x ray. Negative for infection.  Afebrile.  Trending down.   Acute pancreatitis, recurrent vs unresolved. Developing pseudocyst.  Had  tube feeding, below ligament of treitz. Tube feeding removed today, it cause esophageal ulceration.  S/P cholecystectomy 8-14 Management per GI.   Advanced to heart healthy diet today.    Mild Hyperkalemia;  Resolved.   Severe protein caloric malnutrition;  Tube feeding removed.  Full liquid diet.  Discussed with family option for TPN, they would like to hold on starting TPN.   Mild ileus; resolved. Sx was following.   AKI on CKD stage IV;  Nephrology following  Plan to resume oral lasix today   Anasarca secondary to hypoalbuminemia, aggressive volume resuscitation Received IV lasix.  Plan to resume oral lasix.   Right upper extremity with worsening edema, redness, Cellulitis Repeated  Doppler showed superficial cephalic vein thrombosis. Local care. . Started doxy to treat for cellulitis, received 2 days. 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  Colonoscopy planned  October 28.  Was not iron deficient August.  A fib; stable.  Continue with carvedilol.   COPD, chronic hypoxic respiratory failure on oxygen at home --Remains stable.  Demand ischemia.  PMH CAD, CABG. --Cardiology recommended medical management, consider outpatient stress test.    DVT prophylaxis: SCD Code Status: full code.  Family  Communication: care discussed with multiples family Disposition Plan: advance diet, monitor hb.    Consultants:   Sx  GI  Nephrology    Procedures:  cortrak feeding tube placement on 9/23  Transfusion 1 unit PRBC Echo; Normal LVF with mild BSH, mild MR and TR, mild LVE, grade 3 diastolic dysfunction.. S/P mitral valve repair with moderate MS. No significant change from prior echo The right ventricular systolic pressure was increased consistent with mild pulmonary  hypertension.   Antimicrobials: merrem.    Subjective: She was very sleepy per nurse report. This happen after patient received IV dilaudid. She is now alert, following command. She had some abdominal pain this am. On my evaluation she denies pain. She is breathing well.  Family relates she has been dropping things from her hands. I suspect this is related to deconditioning, neuro exam was non focal. After repetitive movement and assessment of her hands she improved.   Objective: Vitals:   06/18/18 0411 06/18/18 0551 06/18/18 0735 06/18/18 0810  BP:  136/64 (!) 140/49 (!) 145/51  Pulse:  88 83 74  Resp:  16 14 14   Temp: 98.3 F (36.8 C)     TempSrc: Oral     SpO2:  98% 98% 98%  Weight: 73.1 kg     Height:        Intake/Output Summary (Last 24 hours) at 06/18/2018 1225 Last data filed at 06/18/2018 0600 Gross per 24 hour  Intake 480 ml  Output 650 ml  Net -170 ml   Filed Weights   06/15/18 2000 06/16/18 0311 06/18/18 0411  Weight: 70.6 kg 68.4 kg 73.1 kg    Examination:  General exam: Alert. Marland Kitchen  Respiratory system: CTA Cardiovascular system: S 1, S 2 RRR Gastrointestinal system: BS present, soft, nt Central nervous system: Alert, non focal.  Extremities: Right arm with less redness, plus 2 edema   Data Reviewed: I have personally reviewed following labs and imaging studies  CBC: Recent Labs  Lab 06/14/18 0818 06/15/18 0456  06/16/18 0433 06/16/18 1919 06/17/18 0611  06/17/18 1159 06/18/18 0348  WBC 15.0* 11.7*  --   --   --  18.2* 18.1* 16.3*  HGB 7.9* 6.1*   < > 7.2* 11.4* 7.9* 7.4* 7.3*  HCT 26.2* 20.5*   < > 21.8* 35.2* 25.0* 23.4* 23.3*  MCV 100.4* 102.5*  --   --   --  97.7 98.3 100.4*  PLT 348 332  --   --   --  234 231 231   < > = values in this interval not displayed.   Basic Metabolic Panel: Recent Labs  Lab 06/12/18 0422 06/13/18 0526 06/14/18 0818 06/15/18 0456 06/16/18 0433 06/17/18 0611 06/18/18 0348  NA 139 140 138 142 144 142 137  K 5.4* 5.4* 4.7 5.2* 5.4* 5.0 4.2  CL 100 103 103 100 106 105 104  CO2 28 24 28  32 29 25 25   GLUCOSE 84 82 163* 191* 141* 150* 119*  BUN 59* 57* 61* 78* 119* 133* 121*  CREATININE 2.28* 2.32* 2.02* 1.87* 1.99* 2.30* 2.17*  CALCIUM 9.1 9.0 8.8* 8.6* 8.2* 8.6* 8.5*  MG 1.9 2.1  --   --   --   --   --  PHOS 6.0* 5.6*  --   --   --   --   --    GFR: Estimated Creatinine Clearance: 25.9 mL/min (A) (by C-G formula based on SCr of 2.17 mg/dL (H)). Liver Function Tests: Recent Labs  Lab 06/12/18 0422  AST 19  ALT 9  ALKPHOS 35*  BILITOT 1.0  PROT 5.2*  ALBUMIN 2.1*   Recent Labs  Lab 06/12/18 0422  LIPASE 140*   No results for input(s): AMMONIA in the last 168 hours. Coagulation Profile: Recent Labs  Lab 06/15/18 0837  INR 1.28   Cardiac Enzymes: No results for input(s): CKTOTAL, CKMB, CKMBINDEX, TROPONINI in the last 168 hours. BNP (last 3 results) No results for input(s): PROBNP in the last 8760 hours. HbA1C: No results for input(s): HGBA1C in the last 72 hours. CBG: Recent Labs  Lab 06/17/18 2030 06/18/18 0006 06/18/18 0407 06/18/18 0816 06/18/18 1203  GLUCAP 143* 102* 124* 117* 180*   Lipid Profile: No results for input(s): CHOL, HDL, LDLCALC, TRIG, CHOLHDL, LDLDIRECT in the last 72 hours. Thyroid Function Tests: No results for input(s): TSH, T4TOTAL, FREET4, T3FREE, THYROIDAB in the last 72 hours. Anemia Panel: No results for input(s): VITAMINB12, FOLATE,  FERRITIN, TIBC, IRON, RETICCTPCT in the last 72 hours. Sepsis Labs: No results for input(s): PROCALCITON, LATICACIDVEN in the last 168 hours.  No results found for this or any previous visit (from the past 240 hour(s)).       Radiology Studies: Dg Chest Port 1 View  Result Date: 06/17/2018 CLINICAL DATA:  Leukocytosis EXAM: PORTABLE CHEST 1 VIEW COMPARISON:  06/15/2018 FINDINGS: Cardiac shadow remains mildly enlarged. Lungs are well aerated bilaterally. The degree of vascular congestion has improved in the interval. No new focal infiltrate is noted. No bony abnormality is seen. IMPRESSION: Improved vascular congestion when compare with the prior exam. Electronically Signed   By: Inez Catalina M.D.   On: 06/17/2018 13:43        Scheduled Meds: . sodium chloride   Intravenous Once  . ALPRAZolam  0.5 mg Oral QHS  . carvedilol  12.5 mg Oral BID WC  . cholecalciferol  5,000 Units Oral Daily  . darbepoetin (ARANESP) injection - NON-DIALYSIS  60 mcg Subcutaneous Q Fri-1800  . DULoxetine  60 mg Oral Daily  . feeding supplement  1 Container Oral TID BM  . furosemide  80 mg Oral Daily  . gabapentin  300 mg Oral BID  . insulin aspart  0-9 Units Subcutaneous Q4H  . isosorbide mononitrate  15 mg Oral Daily  . lipase/protease/amylase  12,000 Units Oral TID AC  . mouth rinse  15 mL Mouth Rinse BID  . mirtazapine  7.5 mg Oral QHS  . mometasone-formoterol  2 puff Inhalation BID  . pantoprazole sodium  40 mg Oral BID  . sucralfate  1 g Oral TID WC & HS  . vitamin B-12  100 mcg Per Tube Daily   Continuous Infusions: . sodium chloride 250 mL (06/16/18 0821)  . sodium chloride Stopped (06/17/18 0000)     LOS: 21 days    Time spent: 64 minutes./     Elmarie Shiley, MD [facility] Pager [phone]  If 7PM-7AM, please contact night-coverage [email or web address] Password [email or web address] 06/18/2018, 12:25 PM

## 2018-06-18 NOTE — Consult Note (Signed)
Physical Medicine and Rehabilitation Consult Reason for Consult: Weakness Referring Phsyician: Jerald Kief [name] is an 67 y.o. female.   HPI: Patient admitted 05/28/2018 with gastrointestinal bleeding which has been recurrent.  She was hospitalized in August 2019 for gallstone pancreatitis and underwent laparoscopic cholecystectomy.  While in the ED her lipase was 399 a white count of 23.7 with a normal temperature.  CT of the abdomen showed worsening inflammatory changes around the pancreatic head and neck descending duodenum and hepatic flexure of the colon.  A poorly defined fluid collection around the anterior pancreatic head was noted in 1 inferior to the gallbladder fossa.  GI consult was obtained.  Concerns were for inflammatory versus infectious pancreatitis.  Cardiology consult on 05/29/2018 for chest pain as well as abnormal EKG.  Troponins were also elevated.  Of note is that the patient has had multiple procedures for peripheral arterial disease including renal artery stenting, carotid endarterectomy, lower extremity bypass grafting, left subclavian stenting, endovascular AAA complicated by retroperitoneal bleed May 2019.  Patient has multiple stenotic lesions LAD RCA Nephrology was consulted for renal failure with progressive worsening of renal function.  patient diagnosed with acute on chronic renal failure mainly due to diuresis. Palliative care evaluated patient on 06/09/2018 for goals of care, remains full code Seen by critical care for hematemesis and acute blood loss anemia with hypotension.  Last physical therapy session 06/16/2018 mod assist bed mobility mod assist transfers No OT thus far Review of Systems -unable to perform secondary to lethargy and poor attention  Past Medical History:  Diagnosis Date  . AAA (abdominal aortic aneurysm) ([place]) 5/09   3.7x3.3 by u/s 2009  . Abnormal EKG    deep TW inversions chronic  . Anemia   . CAD (coronary artery disease)    a. CABG  03/2011 LIMA to LAD, SVG to OM, SVG to PDA. b. cath 06/25/2015 DES to SVG to rPDA, patent LIMA to LAD, patent SVG to OM  . carotid stenosis 5/09   S/p L CEA ;  60-79% bilat ICA by preCABG dopplers 7/12  . CHF (congestive heart failure) ([place])   . Chronic back pain    "all over" (06/24/2015)  . Chronic bronchitis ([place])    "get it pretty much q yr" (06/24/2015)  . CKD (chronic kidney disease)   . Complication of anesthesia 1966   "problem w/ether"  . COPD (chronic obstructive pulmonary disease) ([place])   . Depression   . GERD (gastroesophageal reflux disease)    With hiatal hernia  . GIB (gastrointestinal bleeding) 9/11   S/p EGD with cautery at [facility]  . Heart murmur   . History of blood transfusion "a few times"   "all related to anemia" (06/24/2015)  . History of hiatal hernia   . Hyperlipidemia   . Hypertension   . Mitral regurgitation    3+ MR by intraoperative TEE;  s/p MV repair with Dr. Roxy Manns 7/12  . Myocardial infarction [facility]) 2012 "several"  . On home oxygen therapy    "2 liters at night; negative for sleep apnea"  . PAD (peripheral artery disease) ([facility])    Severe; s/p bilateral renal artery stents, moderate in-stent restenosis  . Pancreatitis 05/2018  . Paroxysmal atrial fibrillation ([facility])    coumadin;  echo 9/07: EF 60%, mild LVH;  s/p Cox Maze 7/12 with LAA clipping  . Subclavian artery stenosis, left ([facility])    stented by Dr. Trula Slade on 10/18 to help flow of her LIMA to LAD  .  Type II diabetes mellitus ([place])    Past Surgical History:  Procedure Laterality Date  . A/V FISTULAGRAM Left 03/21/2018   Procedure: A/V FISTULAGRAM;  Surgeon: Serafina Mitchell, MD;  Location: [place] [facility];  Service: Cardiovascular;  Laterality: Left;  . ABDOMINAL AORTIC ENDOVASCULAR STENT GRAFT N/A 01/17/2018   Procedure: ABDOMINAL AORTIC ENDOVASCULAR STENT GRAFT WITH CO2;  Surgeon: Serafina Mitchell, MD;  Location: [place];  Service: Vascular;  Laterality: N/A;  . ABDOMINAL HYSTERECTOMY  1990's   . APPENDECTOMY  Aug. 11, 2016   Ruptured  . AV FISTULA PLACEMENT Left 11/03/2017   Procedure: ARTERIOVENOUS (AV) FISTULA CREATION LEFT ARM;  Surgeon: Serafina Mitchell, MD;  Location: [place];  Service: Vascular;  Laterality: Left;  . BOWEL RESECTION  2013  . CARDIAC CATHETERIZATION N/A 06/24/2015   Procedure: Left Heart Cath and Cors/Grafts Angiography;  Surgeon: Leonie Man, MD;  Location: [place] [facility];  Service: Cardiovascular;  Laterality: N/A;  . CARDIAC CATHETERIZATION N/A 06/25/2015   Procedure: Coronary Stent Intervention;  Surgeon: Sherren Mocha, MD;  Location: [place] [facility];  Service: Cardiovascular;  Laterality: N/A;  . CAROTID ENDARTERECTOMY Left   . CATARACT EXTRACTION Right 03/27/2018  . CHOLECYSTECTOMY N/A 04/19/2018   Procedure: LAPAROSCOPIC CHOLECYSTECTOMY WITH INTRAOPERATIVE CHOLANGIOGRAM;  Surgeon: Rolm Bookbinder, MD;  Location: [place];  Service: General;  Laterality: N/A;  . CORONARY ANGIOPLASTY    . CORONARY ARTERY BYPASS GRAFT  03/05/2011   CABG X3 (LIMA to LAD, SVG to OM, SVG to PDA, EVH via left thigh  . CORONARY STENT PLACEMENT    . ESOPHAGOGASTRODUODENOSCOPY N/A 04/12/2018   Procedure: ESOPHAGOGASTRODUODENOSCOPY (EGD);  Surgeon: [name], MD;  Location: [facility]OSCOPY;  Service: Endoscopy;  Laterality: N/A;  . ESOPHAGOGASTRODUODENOSCOPY N/A 06/16/2018   Procedure: ESOPHAGOGASTRODUODENOSCOPY (EGD);  Surgeon: Thornton Park, MD;  Location: [place];  Service: Gastroenterology;  Laterality: N/A;  . LACERATION REPAIR Right 1990's   WRIST  . MAZE  03/05/2011   complete biatrial lesion set with clipping of LA appendage  . MITRAL VALVE REPAIR  03/05/2011   49mm Memo 3D ring annuloplasty for ischemic MR  . PERIPHERAL VASCULAR BALLOON ANGIOPLASTY Left 03/21/2018   Procedure: PERIPHERAL VASCULAR BALLOON ANGIOPLASTY;  Surgeon: Serafina Mitchell, MD;  Location: [place] [facility];  Service: Cardiovascular;  Laterality: Left;  . PERIPHERAL VASCULAR  CATHETERIZATION N/A 06/24/2015   Procedure: Aortic Arch Angiography;  Surgeon: Serafina Mitchell, MD;  Location: [place] [facility];  Service: Cardiovascular;  Laterality: N/A;  . PERIPHERAL VASCULAR CATHETERIZATION  06/24/2015   Procedure: Peripheral Vascular Intervention;  Surgeon: Serafina Mitchell, MD;  Location: [place] [facility];  Service: Cardiovascular;;  . PERIPHERAL VASCULAR CATHETERIZATION N/A 06/24/2015   Procedure: Abdominal Aortogram;  Surgeon: Serafina Mitchell, MD;  Location: [place] [facility];  Service: Cardiovascular;  Laterality: N/A;  . RENAL ANGIOGRAM Bilateral 09/11/2013   Procedure: RENAL ANGIOGRAM;  Surgeon: Serafina Mitchell, MD;  Location: [facility] C[facility] LAB;  Service: Cardiovascular;  Laterality: Bilateral;  . RIGHT FEMORAL-POPLITEAL BYPASS     Family History  Problem Relation Age of Onset  . Emphysema Mother   . Heart disease Mother        before age 27  . Hypertension Mother   . Hyperlipidemia Mother   . Heart attack Mother   . AAA (abdominal aortic aneurysm) Mother        rupture  . Heart attack Father 17  . Emphysema Father   . Heart disease  Father        before age 47  . Hyperlipidemia Father   . Hypertension Father   . Peripheral vascular disease Father   . Diabetes Brother   . Heart disease Brother        before age 55  . Hyperlipidemia Brother   . Hypertension Brother   . Heart attack Brother        CABG  . Heart disease Other        Vascular disease in grandparents, uncles and dad  . Colon cancer Neg Hx    Social History:  reports that she has been smoking cigarettes. She has smoked for the past 50.00 years. She has never used smokeless tobacco. She reports that she drinks alcohol. She reports that she has current or past drug history. Drug: Marijuana. Allergies:  Allergies  Allergen Reactions  . Isosorbide Other (See Comments)    Can only tolerate in low doses  . Isosorbide Nitrate Other (See Comments)    Can only tolerate in low doses  .  Oxycodone-Acetaminophen Itching  . Codeine Itching  . Contrast Media [Iodinated Diagnostic Agents] Itching and Other (See Comments)    Itching of feet  . Ioxaglate Itching and Other (See Comments)    Itching of feet  . Metrizamide Itching and Other (See Comments)    Itching of feet  . Percocet [Oxycodone-Acetaminophen] Itching and Other (See Comments)    Tolerates Hydrocodone   Medications Prior to Admission  Medication Sig Dispense Refill  . acetaminophen (TYLENOL) 325 MG tablet Take 325 mg by mouth every 6 (six) hours as needed (for pain).    Marland Kitchen albuterol (PROVENTIL HFA;VENTOLIN HFA) 108 (90 Base) MCG/ACT inhaler Inhale 2 puffs into the lungs every 4 (four) hours as needed for wheezing or shortness of breath. ProAir (Patient taking differently: Inhale 2 puffs into the lungs every 4 (four) hours as needed for wheezing or shortness of breath. ) 1 each 0  . allopurinol (ZYLOPRIM) 100 MG tablet Take 100 mg by mouth daily.  2  . ALPRAZolam (XANAX) 0.5 MG tablet Take 0.5 mg by mouth at bedtime.     Marland Kitchen amLODipine (NORVASC) 10 MG tablet Take 10 mg by mouth daily.      Marland Kitchen atorvastatin (LIPITOR) 40 MG tablet Take 40 mg by mouth every evening.     . budesonide-formoterol (SYMBICORT) 160-4.5 MCG/ACT inhaler Inhale 2 puffs into the lungs 2 (two) times daily as needed (for flares).    . carvedilol (COREG) 25 MG tablet TAKE 1 TABLET BY MOUTH 2 TIMES A DAY WITH A MEAL (Patient taking differently: Take 25 mg by mouth 2 (two) times daily with a meal. ) 60 tablet 9  . Cholecalciferol (VITAMIN D3) 5000 units TABS Take 5,000 Units by mouth daily.    . cloNIDine (CATAPRES) 0.2 MG tablet Take 1 tablet (0.2 mg total) by mouth 3 (three) times daily. 90 tablet 11  . clopidogrel (PLAVIX) 75 MG tablet Take 1 tablet (75 mg total) by mouth daily with breakfast. 90 tablet 3  . docusate sodium (COLACE) 100 MG capsule Take 200 mg by mouth daily as needed (when taking pain medications).     . DULoxetine (CYMBALTA) 60 MG  capsule Take 60 mg by mouth daily.   1  . famotidine (PEPCID) 20 MG tablet TAKE 1 TABLET (20 MG TOTAL) BY MOUTH DAILY BEFORE SUPPER. (Patient taking differently: Take 20 mg by mouth daily before supper. ) 30 tablet 2  . fenofibrate 160 MG tablet  Take 1 tablet (160 mg total) by mouth daily. (Patient taking differently: Take 160 mg by mouth at bedtime. ) 30 tablet 1  . ferrous sulfate 325 (65 FE) MG tablet Take 325 mg by mouth 2 (two) times daily with a meal.    . furosemide (LASIX) 40 MG tablet Take 40 mg by mouth See admin instructions. Take 40 mg by mouth once a day and may take an additional 40 mg day as needed for weight gain  6  . gabapentin (NEURONTIN) 300 MG capsule TAKE 1 CAPSULE(300 MG) BY MOUTH TWICE DAILY (Patient taking differently: Take 300 mg by mouth 2 (two) times daily. ) 60 capsule 0  . hydrALAZINE (APRESOLINE) 100 MG tablet Take 100 mg by mouth 3 (three) times daily.    Marland Kitchen HYDROcodone-acetaminophen (NORCO/VICODIN) 5-325 MG tablet Take 1 tablet by mouth every 6 (six) hours as needed for moderate pain. 20 tablet 0  . isosorbide mononitrate (IMDUR) 30 MG 24 hr tablet Take 0.5 tablets (15 mg total) by mouth daily. 45 tablet 3  . mirtazapine (REMERON) 7.5 MG tablet Take 7.5 mg by mouth at bedtime.     . pantoprazole (PROTONIX) 40 MG tablet Take 1 tablet (40 mg total) by mouth daily. 30 tablet 11  . Potassium 99 MG TABS Take 99 mg by mouth daily.     . promethazine (PHENERGAN) 25 MG tablet Take 1 tablet (25 mg total) by mouth every 6 (six) hours as needed for nausea or vomiting. 15 tablet 0    Home: Home Living Family/patient expects to be discharged to:: Private residence Living Arrangements: Alone Available Help at Discharge: Family(reports can be available 24hr initially) Type of Home: Apartment("parent suite" off of daughter's home) Home Access: Ramped entrance Home Layout: One level Bathroom Shower/Tub: Multimedia programmer: Standard Home Equipment: Environmental consultant - 4  wheels, Bedside commode  Functional History: Prior Function Comments: using RW, per chart review hx of at least 1 fall; was completing ADLs without assist Functional Status:  Mobility:     Ambulation/Gait Gait Distance (Feet): 55 Feet Gait velocity: slowed General Gait Details: Not strong enough yet to attempt gait, would be safer with two people.     ADL: ADL Lower Body Dressing Details (indicate cue type and reason): totalA to don socks at bed level this session Toileting - Clothing Manipulation Details (indicate cue type and reason): pt with foley and rectal tube  Cognition: Cognition Overall Cognitive Status: Impaired/Different from baseline Orientation Level: Oriented X4 Cognition Arousal/Alertness: Awake/alert Behavior During Therapy: Flat affect Overall Cognitive Status: Impaired/Different from baseline Area of Impairment: Orientation, Problem solving Orientation Level: Disoriented to, Time Problem Solving: Slow processing, Decreased initiation General Comments: pt overall oriented, disoriented to month only; slower processing noted; daughter often answering for pt   Blood pressure (!) 145/51, pulse 74, temperature 98.3 F (36.8 C), temperature source Oral, resp. rate 14, height 5\' 5"  (1.651 m), weight 73.1 kg, SpO2 98 %. Physical Exam  General: No acute distress Mood and affect are appropriate Heart: Regular rate and rhythm no rubs murmurs or extra sounds Lungs: Clear to auscultation, breathing unlabored, no rales or wheezes, decreased breath sounds in bases Abdomen: Positive bowel sounds, soft nontender to palpation, mildly distended Extremities: No clubbing, cyanosis, 1+ pretibial and pedal edema Skin: No evidence of breakdown, no evidence of rash Neurologic: Cranial nerves II through XII intact, motor strength is 3-/5 in bilateral deltoid, bicep, tricep, grip, hip flexor, knee extensors, ankle dorsiflexor and plantar flexor Sensory exam normal sensation  to light  touch and proprioception in bilateral upper and lower extremities Cerebellar exam cannot follow and concentrate on task Oriented to person and hospital and month but not year Remembers 1/3 objects after delay Musculoskeletal: Reduced range of active motion in all 4 extremities. No joint swelling Results for orders placed or performed during the hospital encounter of 05/28/18 (from the past 24 hour(s))  CBC     Status: Abnormal   Collection Time: 06/17/18 11:59 AM  Result Value Ref Range   WBC 18.1 (H) 4.0 - 10.5 K/uL   RBC 2.38 (L) 3.87 - 5.11 MIL/uL   Hemoglobin 7.4 (L) 12.0 - 15.0 g/dL   HCT 23.4 (L) 36.0 - 46.0 %   MCV 98.3 80.0 - 100.0 fL   MCH 31.1 26.0 - 34.0 pg   MCHC 31.6 30.0 - 36.0 g/dL   RDW 17.8 (H) 11.5 - 15.5 %   Platelets 231 150 - 400 K/uL   nRBC 0.9 (H) 0.0 - 0.2 %  Glucose, capillary     Status: Abnormal   Collection Time: 06/17/18 12:23 PM  Result Value Ref Range   Glucose-Capillary 195 (H) 70 - 99 mg/dL  Prepare RBC     Status: None   Collection Time: 06/17/18 12:36 PM  Result Value Ref Range   Order Confirmation      ORDER PROCESSED BY BLOOD BANK Performed at [facility], [address] [place], [place] [address]   Glucose, capillary     Status: None   Collection Time: 06/17/18  5:11 PM  Result Value Ref Range   Glucose-Capillary 99 70 - 99 mg/dL  Glucose, capillary     Status: Abnormal   Collection Time: 06/17/18  8:30 PM  Result Value Ref Range   Glucose-Capillary 143 (H) 70 - 99 mg/dL  Glucose, capillary     Status: Abnormal   Collection Time: 06/18/18 12:06 AM  Result Value Ref Range   Glucose-Capillary 102 (H) 70 - 99 mg/dL  CBC     Status: Abnormal   Collection Time: 06/18/18  3:48 AM  Result Value Ref Range   WBC 16.3 (H) 4.0 - 10.5 K/uL   RBC 2.32 (L) 3.87 - 5.11 MIL/uL   Hemoglobin 7.3 (L) 12.0 - 15.0 g/dL   HCT 23.3 (L) 36.0 - 46.0 %   MCV 100.4 (H) 80.0 - 100.0 fL   MCH 31.5 26.0 - 34.0 pg   MCHC 31.3 30.0 - 36.0 g/dL   RDW  17.8 (H) 11.5 - 15.5 %   Platelets 231 150 - 400 K/uL   nRBC 0.6 (H) 0.0 - 0.2 %  Basic metabolic panel     Status: Abnormal   Collection Time: 06/18/18  3:48 AM  Result Value Ref Range   Sodium 137 135 - 145 mmol/L   Potassium 4.2 3.5 - 5.1 mmol/L   Chloride 104 98 - 111 mmol/L   CO2 25 22 - 32 mmol/L   Glucose, Bld 119 (H) 70 - 99 mg/dL   BUN 121 (H) 8 - 23 mg/dL   Creatinine, Ser 2.17 (H) 0.44 - 1.00 mg/dL   Calcium 8.5 (L) 8.9 - 10.3 mg/dL   GFR calc non Af Amer 23 (L) >60 mL/min   GFR calc Af Amer 26 (L) >60 mL/min   Anion gap 8 5 - 15  Glucose, capillary     Status: Abnormal   Collection Time: 06/18/18  4:07 AM  Result Value Ref Range   Glucose-Capillary 124 (H) 70 -  99 mg/dL  Glucose, capillary     Status: Abnormal   Collection Time: 06/18/18  8:16 AM  Result Value Ref Range   Glucose-Capillary 117 (H) 70 - 99 mg/dL   Dg Chest Port 1 View  Result Date: 06/17/2018 CLINICAL DATA:  Leukocytosis EXAM: PORTABLE CHEST 1 VIEW COMPARISON:  06/15/2018 FINDINGS: Cardiac shadow remains mildly enlarged. Lungs are well aerated bilaterally. The degree of vascular congestion has improved in the interval. No new focal infiltrate is noted. No bony abnormality is seen. IMPRESSION: Improved vascular congestion when compare with the prior exam. Electronically Signed   By: Inez Catalina M.D.   On: 06/17/2018 13:43    Assessment/Plan: Diagnosis: Severe deconditioning after recurrent GI bleed in a patient with severe peripheral artery disease, coronary artery disease, as well as chronic kidney disease.  Also has problems with memory and attention, question cerebrovascular disease 1. Does the need for close, 24 hr/day medical supervision in concert with the patient's rehab needs make it unreasonable for this patient to be served in a less intensive setting? Potentially 2. Co-Morbidities requiring supervision/potential complications: Anemia, diabetes, leukocytosis question etiology 3. Due to bladder  management, bowel management, safety, skin/wound care, disease management, medication administration, pain management and patient education, does the patient require 24 hr/day rehab nursing? Potentially 4. Does the patient require coordinated care of a physician, rehab nurse, PT, OT, speech therapy to address physical and functional deficits in the context of the above medical diagnosis(es)? Potentially Addressing deficits in the following areas: balance, endurance, locomotion, strength, transferring, bowel/bladder control, bathing, dressing, feeding, grooming, toileting, cognition, speech, language, swallowing and psychosocial support 5. Can the patient actively participate in an intensive therapy program of at least 3 hrs of therapy per day at least 5 days per week? No 6. The potential for patient to make measurable gains while on inpatient rehab is Limited by very poor endurance as well as her cognition 7. Anticipated functional outcomes upon discharge from inpatients are Min/Sup PT, Min/Sup OT, Sup SLP 8. Estimated rehab length of stay to reach the above functional goals is: NA 9. Does the patient have adequate social supports to accommodate these discharge functional goals? N/A 10. Anticipated D/C setting: SNF 11. Anticipated post D/C treatments: Will need family support as well as home health following discharge 12. Overall Rehab/Functional Prognosis: fair  RECOMMENDATIONS: This patient's condition is appropriate for continued rehabilitative care in the following setting: Currently not able to tolerate inpatient rehabilitation at an intensive level. Patient has agreed to participate in recommended program. N/A Note that insurance prior authorization may be required for reimbursement for recommended care.  Comment: Sister and daughters work full-time.  They would like her to be able to be left at home during the day and they can help her after work hours.  As discussed with them I did not think  that this would be achievable even after inpatient rehab stay.  The immediate concern is that she is currently not able to tolerate inpatient rehab.   Charlett Blake 06/18/2018

## 2018-06-18 NOTE — Progress Notes (Signed)
Daily Rounding Note  06/18/2018, 10:21 AM  LOS: 21 days   SUBJECTIVE:   Chief complaint: abdominal pain.  Some pain in LLQ overnight.   Got drowsy, less responsive after Dilaudid so prn Dilaudid stopped, Gabapentin started.     Percentage of meals she consumes are not recorded but she is not eating much.  No nausea  OBJECTIVE:         Vital signs in last 24 hours:    Temp:  [97.6 F (36.4 C)-98.3 F (36.8 C)] 98.3 F (36.8 C) (10/13 0411) Pulse Rate:  [77-91] 88 (10/13 0551) Resp:  [10-17] 16 (10/13 0551) BP: (136-163)/(49-64) 136/64 (10/13 0551) SpO2:  [94 %-99 %] 98 % (10/13 0551) Weight:  [73.1 kg] 73.1 kg (10/13 0411) Last BM Date: 06/17/18 Filed Weights   06/15/18 2000 06/16/18 0311 06/18/18 0411  Weight: 70.6 kg 68.4 kg 73.1 kg   General: She looks better but she is far from looking well. Heart: RRR. Chest: No labored breathing or cough.  Clear with reduced breath sounds overall. Abdomen: Soft.  Not tender or distended.  Bowel sounds active. Extremities: Edema, swelling in all 4 limbs. Neuro/Psych: Alert, not drowsy.  Oriented x3.  More engaged.  Brighter affect.  Intake/Output from previous day: 10/12 0701 - 10/13 0700 In: 660 [P.O.:660] Out: 650 [Urine:650]  Intake/Output this shift: No intake/output data recorded.  Lab Results: Recent Labs    06/17/18 0611 06/17/18 1159 06/18/18 0348  WBC 18.2* 18.1* 16.3*  HGB 7.9* 7.4* 7.3*  HCT 25.0* 23.4* 23.3*  PLT 234 231 231   BMET Recent Labs    06/16/18 0433 06/17/18 0611 06/18/18 0348  NA 144 142 137  K 5.4* 5.0 4.2  CL 106 105 104  CO2 29 25 25   GLUCOSE 141* 150* 119*  BUN 119* 133* 121*  CREATININE 1.99* 2.30* 2.17*  CALCIUM 8.2* 8.6* 8.5*   LFT No results for input(s): PROT, ALBUMIN, AST, ALT, ALKPHOS, BILITOT, BILIDIR, IBILI in the last 72 hours. PT/INR No results for input(s): LABPROT, INR in the last 72 hours. Hepatitis  Panel No results for input(s): HEPBSAG, HCVAB, HEPAIGM, HEPBIGM in the last 72 hours.  Studies/Results: Dg Chest Port 1 View  Result Date: 06/17/2018 CLINICAL DATA:  Leukocytosis EXAM: PORTABLE CHEST 1 VIEW COMPARISON:  06/15/2018 FINDINGS: Cardiac shadow remains mildly enlarged. Lungs are well aerated bilaterally. The degree of vascular congestion has improved in the interval. No new focal infiltrate is noted. No bony abnormality is seen. IMPRESSION: Improved vascular congestion when compare with the prior exam. Electronically Signed   By: Inez Catalina M.D.   On: 06/17/2018 13:43   Scheduled Meds: . sodium chloride   Intravenous Once  . ALPRAZolam  0.5 mg Oral QHS  . carvedilol  12.5 mg Oral BID WC  . cholecalciferol  5,000 Units Oral Daily  . darbepoetin (ARANESP) injection - NON-DIALYSIS  60 mcg Subcutaneous Q Fri-1800  . DULoxetine  60 mg Oral Daily  . feeding supplement  1 Container Oral TID BM  . gabapentin  300 mg Oral BID  . insulin aspart  0-9 Units Subcutaneous Q4H  . isosorbide mononitrate  15 mg Oral Daily  . lipase/protease/amylase  12,000 Units Oral TID AC  . mouth rinse  15 mL Mouth Rinse BID  . mirtazapine  7.5 mg Oral QHS  . mometasone-formoterol  2 puff Inhalation BID  . pantoprazole sodium  40 mg Oral BID  . sucralfate  1  g Oral TID WC & HS  . vitamin B-12  100 mcg Per Tube Daily   Continuous Infusions: . sodium chloride 250 mL (06/16/18 0821)  . sodium chloride Stopped (06/17/18 0000)   PRN Meds:.acetaminophen **OR** acetaminophen, alum & mag hydroxide-simeth, hydrALAZINE, HYDROcodone-acetaminophen, hydrOXYzine, levalbuterol, ondansetron (ZOFRAN) IV, phenol, polyethylene glycol   ASSESMENT:   *   Hematemesis.  Resolved.   06/16/2018 EGD revealed 2, nonbleeding, esophageal ulcers likely due to coretrack feeding tube.  Clotted blood in the gastric cardia, fundus and body limiting full evaluation.  Clotted blood at the tip of the Dobbhoff was removed  endoscopically and the tube was removed. Suggestion was not to replace feeding tube. Bid po protonix, ac/hs carafate in place.    *    Acute, complicated pancreatitis with pseudocyst, necrosis.  Initial presentation in August/2019 felt to be biliary pancreatitis at the time Laparoscopic cholecystectomy 04/19/2018. Surveillance of the pancreas complicated by late stage CKD and inability to obtain IV contrasted studies.   Pancrease in place  *    Protein calorie malnutrition.  Given the EGD findings, should not have replacement of the core tract feeding tube. On full liquids.    *    Blood loss anemia, low hgb persists but not clearly in need of additional PRBC So far this admission she is received 5 U PRBCs, 4 of these in the last 3 days, latest transfusion 10/11.  Receiving Ferric Gluconate (Nuelicit), aranesp during HD.     *  Diarrhea.  Rectal tube and pouch placed 10/11L: no significant stools since.   *   Chronic Plavix, on hold.  Indications are peripheral vascular disease, AAA stent graft repair 01/2018.   *   IDDM.    PLAN   *   Advance to low fat (HH) diet.  Not carb mod yet, want her to have access to as many foods as she can.    *  Remove rectal tube.    *   ? Target date for restarting Plavix?    Azucena Freed  06/18/2018, 10:21 AM Phone [phone]

## 2018-06-18 NOTE — Progress Notes (Signed)
Completed blood product for previous shift due to alert to complete blood product. No blood products documented for previous shift and current shift.

## 2018-06-18 NOTE — Progress Notes (Signed)
[facility] Progress Note   Subjective:     Hemoglobin is largely stable  Serum creatinine slightly improved, BUN trending down, potassium and bicarbonate are stable  Released to 0.7 L urine output yesterday  Tolerating some diet, not much  Family present, updated  Objective Vitals:   06/17/18 2347 06/18/18 0018 06/18/18 0411 06/18/18 0551  BP: (!) 148/57   136/64  Pulse: 80   88  Resp: 14   16  Temp:  97.8 F (36.6 C) 98.3 F (36.8 C)   TempSrc:  Oral Oral   SpO2: 94%   98%  Weight:   73.1 kg   Height:       Physical Exam General: chronically ill appearing Heart: no rub, RRR Lungs: normal WOB, dec BS bases Abdomen: soft, mildly TTP epigastrium Extremities:  Anasarca 4 limbs, 2+ pitting; LUE AVF +t/b  Additional Objective Labs: Basic Metabolic Panel: Recent Labs  Lab 06/12/18 0422 06/13/18 0526  06/16/18 0433 06/17/18 0611 06/18/18 0348  NA 139 140   < > 144 142 137  K 5.4* 5.4*   < > 5.4* 5.0 4.2  CL 100 103   < > 106 105 104  CO2 28 24   < > 29 25 25   GLUCOSE 84 82   < > 141* 150* 119*  BUN 59* 57*   < > 119* 133* 121*  CREATININE 2.28* 2.32*   < > 1.99* 2.30* 2.17*  CALCIUM 9.1 9.0   < > 8.2* 8.6* 8.5*  PHOS 6.0* 5.6*  --   --   --   --    < > = values in this interval not displayed.   Liver Function Tests: Recent Labs  Lab 06/12/18 0422  AST 19  ALT 9  ALKPHOS 35*  BILITOT 1.0  PROT 5.2*  ALBUMIN 2.1*   Recent Labs  Lab 06/12/18 0422  LIPASE 140*   CBC: Recent Labs  Lab 06/14/18 0818 06/15/18 0456  06/17/18 0611 06/17/18 1159 06/18/18 0348  WBC 15.0* 11.7*  --  18.2* 18.1* 16.3*  HGB 7.9* 6.1*   < > 7.9* 7.4* 7.3*  HCT 26.2* 20.5*   < > 25.0* 23.4* 23.3*  MCV 100.4* 102.5*  --  97.7 98.3 100.4*  PLT 348 332  --  234 231 231   < > = values in this interval not displayed.   Blood Culture    Component Value Date/Time   SDES BLOOD RIGHT HAND 06/05/2018 1453   SPECREQUEST  06/05/2018 1453    BOTTLES DRAWN  AEROBIC ONLY Blood Culture adequate volume   CULT  06/05/2018 1453    NO GROWTH 5 DAYS Performed at [facility], [place] [address], [place], [place] [address]    REPTSTATUS 06/10/2018 FINAL 06/05/2018 1453    Cardiac Enzymes: No results for input(s): CKTOTAL, CKMB, CKMBINDEX, TROPONINI in the last 168 hours. CBG: Recent Labs  Lab 06/17/18 1711 06/17/18 2030 06/18/18 0006 06/18/18 0407 06/18/18 0816  GLUCAP 99 143* 102* 124* 117*   Iron Studies: No results for input(s): IRON, TIBC, TRANSFERRIN, FERRITIN in the last 72 hours. @lablastinr3 @ Studies/Results: Dg Chest Port 1 View  Result Date: 06/17/2018 CLINICAL DATA:  Leukocytosis EXAM: PORTABLE CHEST 1 VIEW COMPARISON:  06/15/2018 FINDINGS: Cardiac shadow remains mildly enlarged. Lungs are well aerated bilaterally. The degree of vascular congestion has improved in the interval. No new focal infiltrate is noted. No bony abnormality is seen. IMPRESSION: Improved vascular congestion when compare with the prior exam. Electronically  Signed   By: Inez Catalina M.D.   On: 06/17/2018 13:43   Medications: . sodium chloride 250 mL (06/16/18 0821)  . sodium chloride Stopped (06/17/18 0000)   . sodium chloride   Intravenous Once  . ALPRAZolam  0.5 mg Oral QHS  . carvedilol  12.5 mg Oral BID WC  . cholecalciferol  5,000 Units Oral Daily  . darbepoetin (ARANESP) injection - NON-DIALYSIS  60 mcg Subcutaneous Q Fri-1800  . DULoxetine  60 mg Oral Daily  . feeding supplement  1 Container Oral TID BM  . gabapentin  300 mg Oral BID  . insulin aspart  0-9 Units Subcutaneous Q4H  . isosorbide mononitrate  15 mg Oral Daily  . lipase/protease/amylase  12,000 Units Oral TID AC  . mouth rinse  15 mL Mouth Rinse BID  . mirtazapine  7.5 mg Oral QHS  . mometasone-formoterol  2 puff Inhalation BID  . pantoprazole sodium  40 mg Oral BID  . sucralfate  1 g Oral TID WC & HS  . vitamin B-12  100 mcg Per Tube Daily    Assessment/Plan: 1.  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  Followed by Posey Pronto at [facility].    Has LUV AVF.    Renal function stable after gastrointestinal hemorrhage  Will resume once daily diuretics at the current time: Lasix 80 p.o. daily  2.  HTN: labile, CTM; resume amlodipine if remains elevated  3.  Hyperkalemia: Recent potassiums improved.  Would use Veltassa or Lokelma, if necessary for potassium greater than 5.7.  4.  Anemia:   Recent acute blood loss anemia.  Received 40 mcg of Aranesp on 10/11.  For now continue weekly  Pearson Grippe MD 06/18/2018, 11:09 AM  [facility]

## 2018-06-19 ENCOUNTER — Inpatient Hospital Stay (HOSPITAL_COMMUNITY): Payer: Medicare Other

## 2018-06-19 DIAGNOSIS — K2211 Ulcer of esophagus with bleeding: Secondary | ICD-10-CM

## 2018-06-19 DIAGNOSIS — R404 Transient alteration of awareness: Secondary | ICD-10-CM

## 2018-06-19 DIAGNOSIS — D500 Iron deficiency anemia secondary to blood loss (chronic): Secondary | ICD-10-CM

## 2018-06-19 LAB — TYPE AND SCREEN
ABO/RH(D): B POS
Antibody Screen: NEGATIVE
UNIT DIVISION: 0
UNIT DIVISION: 0
UNIT DIVISION: 0
Unit division: 0
Unit division: 0

## 2018-06-19 LAB — CBC
HEMATOCRIT: 21.8 % — AB (ref 36.0–46.0)
Hemoglobin: 6.8 g/dL — CL (ref 12.0–15.0)
MCH: 31.8 pg (ref 26.0–34.0)
MCHC: 31.2 g/dL (ref 30.0–36.0)
MCV: 101.9 fL — AB (ref 80.0–100.0)
Platelets: 226 10*3/uL (ref 150–400)
RBC: 2.14 MIL/uL — ABNORMAL LOW (ref 3.87–5.11)
RDW: 18 % — AB (ref 11.5–15.5)
WBC: 14.1 10*3/uL — ABNORMAL HIGH (ref 4.0–10.5)
nRBC: 0.3 % — ABNORMAL HIGH (ref 0.0–0.2)

## 2018-06-19 LAB — BASIC METABOLIC PANEL WITH GFR
Anion gap: 9 (ref 5–15)
BUN: 109 mg/dL — ABNORMAL HIGH (ref 8–23)
CO2: 25 mmol/L (ref 22–32)
Calcium: 8.2 mg/dL — ABNORMAL LOW (ref 8.9–10.3)
Chloride: 103 mmol/L (ref 98–111)
Creatinine, Ser: 2.21 mg/dL — ABNORMAL HIGH (ref 0.44–1.00)
GFR calc Af Amer: 26 mL/min — ABNORMAL LOW (ref >60)
GFR calc non Af Amer: 22 mL/min — ABNORMAL LOW (ref >60)
Glucose, Bld: 99 mg/dL (ref 70–99)
Potassium: 3.9 mmol/L (ref 3.5–5.1)
Sodium: 137 mmol/L (ref 135–145)

## 2018-06-19 LAB — BPAM RBC
BLOOD PRODUCT EXPIRATION DATE: 201910312359
BLOOD PRODUCT EXPIRATION DATE: 201910312359
Blood Product Expiration Date: 201910172359
Blood Product Expiration Date: 201910312359
Blood Product Expiration Date: 201911012359
ISSUE DATE / TIME: 201910100945
ISSUE DATE / TIME: 201910101242
ISSUE DATE / TIME: 201910101338
ISSUE DATE / TIME: 201910111309
UNIT TYPE AND RH: 7300
UNIT TYPE AND RH: 7300
UNIT TYPE AND RH: 7300
Unit Type and Rh: 7300
Unit Type and Rh: 9500

## 2018-06-19 LAB — GLUCOSE, CAPILLARY
GLUCOSE-CAPILLARY: 184 mg/dL — AB (ref 70–99)
Glucose-Capillary: 112 mg/dL — ABNORMAL HIGH (ref 70–99)
Glucose-Capillary: 136 mg/dL — ABNORMAL HIGH (ref 70–99)
Glucose-Capillary: 144 mg/dL — ABNORMAL HIGH (ref 70–99)
Glucose-Capillary: 167 mg/dL — ABNORMAL HIGH (ref 70–99)
Glucose-Capillary: 89 mg/dL (ref 70–99)

## 2018-06-19 LAB — HEMOGLOBIN AND HEMATOCRIT, BLOOD
HCT: 27.8 % — ABNORMAL LOW (ref 36.0–46.0)
Hemoglobin: 8.7 g/dL — ABNORMAL LOW (ref 12.0–15.0)

## 2018-06-19 LAB — PREPARE RBC (CROSSMATCH)

## 2018-06-19 MED ORDER — SODIUM CHLORIDE 0.9% IV SOLUTION: Freq: Once | INTRAVENOUS | Status: DC

## 2018-06-19 MED ORDER — DARBEPOETIN ALFA 60 MCG/0.3ML IJ SOSY
60.0000 ug | PREFILLED_SYRINGE | INTRAMUSCULAR | Status: DC
  Administered 2018-06-19: 60 ug via SUBCUTANEOUS
  Filled 2018-06-19 (×2): qty 0.3

## 2018-06-19 MED ORDER — FUROSEMIDE 10 MG/ML IJ SOLN
20.0000 mg | Freq: Once | INTRAMUSCULAR | Status: AC
  Administered 2018-06-19: 20 mg via INTRAVENOUS
  Filled 2018-06-19: qty 2

## 2018-06-19 MED ORDER — HYDROCODONE-ACETAMINOPHEN 5-325 MG PO TABS
1.0000 | ORAL_TABLET | Freq: Four times a day (QID) | ORAL | Status: DC | PRN
  Administered 2018-06-19 – 2018-06-21 (×6): 2 via ORAL
  Filled 2018-06-19 (×6): qty 2

## 2018-06-19 NOTE — Progress Notes (Signed)
3 Days Post-Op    CC: Pancreatitis/GI bleed  Subjective: She is complaining of her bottom being sore, not really getting OOB. Still has a rectal pouch and per PT very weak and debilitated.  No significant abdomina pain and on a heart healthy diet.    Objective: Vital signs in last 24 hours: Temp:  [98.4 F (36.9 C)-98.8 F (37.1 C)] 98.6 F (37 C) (10/14 0508) Pulse Rate:  [67-77] 77 (10/14 0813) Resp:  [14-15] 15 (10/14 0813) BP: (150-163)/(53-58) 163/58 (10/14 0813) SpO2:  [100 %] 100 % (10/14 0813) Weight:  [74.1 kg] 74.1 kg (10/14 0600) Last BM Date: 06/19/18 460 PO Urine 1300  Stool x 1 Afebrile, VSS Creatinine is stable at 2.21, H/H down 6.8/21.8    Intake/Output from previous day: 10/13 0701 - 10/14 0700 In: 460 [P.O.:460] Out: 1300 [Urine:1300] Intake/Output this shift: No intake/output data recorded.  General appearance: alert, cooperative and no distress GI: soft, non-tender; bowel sounds normal; no masses,  no organomegaly  Lab Results:  Recent Labs    06/18/18 0348 06/19/18 0500  WBC 16.3* 14.1*  HGB 7.3* 6.8*  HCT 23.3* 21.8*  PLT 231 226    BMET Recent Labs    06/18/18 0348 06/19/18 0500  NA 137 137  K 4.2 3.9  CL 104 103  CO2 25 25  GLUCOSE 119* 99  BUN 121* 109*  CREATININE 2.17* 2.21*  CALCIUM 8.5* 8.2*   PT/INR No results for input(s): LABPROT, INR in the last 72 hours.  No results for input(s): AST, ALT, ALKPHOS, BILITOT, PROT, ALBUMIN in the last 168 hours.   Lipase     Component Value Date/Time   LIPASE 140 (H) 06/12/2018 0422     Medications: . sodium chloride   Intravenous Once  . sodium chloride   Intravenous Once  . ALPRAZolam  0.5 mg Oral QHS  . aspirin EC  81 mg Oral Daily  . carvedilol  12.5 mg Oral BID WC  . cholecalciferol  5,000 Units Oral Daily  . darbepoetin (ARANESP) injection - NON-DIALYSIS  60 mcg Subcutaneous Q Fri-1800  . DULoxetine  60 mg Oral Daily  . feeding supplement  1 Container Oral TID BM   . furosemide  80 mg Oral Daily  . gabapentin  200 mg Oral BID  . insulin aspart  0-9 Units Subcutaneous Q4H  . isosorbide mononitrate  15 mg Oral Daily  . lipase/protease/amylase  12,000 Units Oral TID AC  . mouth rinse  15 mL Mouth Rinse BID  . mirtazapine  7.5 mg Oral QHS  . mometasone-formoterol  2 puff Inhalation BID  . pantoprazole sodium  40 mg Oral BID  . sucralfate  1 g Oral TID WC & HS  . vitamin B-12  100 mcg Per Tube Daily   . sodium chloride 250 mL (06/16/18 0821)  . sodium chloride Stopped (06/17/18 0000)     Impression: Assessment/Plan Hematemesis with Two non-bleeding esophageal ulcer with evidence for recent bleeding - likely due to Dobhoff tube  - Tube removed by GI AKI on CKD Anasarca  malnutrition Chronic diastolic HF Acute on chronic anemia  A.fib COPD on home O2 Diarrhea  DM2  Atherosclerosis  Ileus, likely secondary to severe necrotizing pancreatitis with pseudocyst, history of gallstone pancreatitis, s/p lap chole on 04-19-18; Dr. Donne Hazel - afebrile, VSS - post-pyloric PANDA placed 10/4, tolerating TFs - lipasetrending down  FEN: Heart healthy  ID: Zosyn 9/22-9/30, Merrem 9/30-10/5; Doxycycline 10/9 -05/17/18 VTE: SCD's Follow up: TBD  Plan:  No surgical issues, she is tolerating a diet.  We will be available as needed.        LOS: 22 days    [name] 06/19/2018 [phone]

## 2018-06-19 NOTE — Progress Notes (Signed)
Physical Therapy Treatment Patient Details Name: [name] MRN: [id number] DOB: Mar 16, 1951 Today's Date: 06/19/2018    History of Present Illness Pt is a 67 y/o female with c/o of abdominal pain. Note recent hospitalization 04/09/18 to 04/21/18 with GI bleed, gallstone pancreatitis, and during which she received laparoscopic cholecystectomy. Diagnosed with recurrent pancreatitis. PMH AAA, CHF, chronic back pain, CKD, COPD, GI bleed, HTN, MI, A-fib, DM, cardiac cath, CABG, maze procedure, mitral valve repair, R femoral-popliteal bypass.  Pt's course was complicated by hypotension and vomiting blood.  She had a significant drop in Hgb into the 5s.  She required several units of PRBCs and was transferred to the ICU.  She underwent an EGD on 06/16/18 which revealed and 8 mm esophageal ulcer.      PT Comments    Pt performed functional mobility with +2 moderate assistance through out session.  She remains weak which limits functional progression.  Transfer to chair performed with additional supine exercises.  Pt remains to buckle in standing and continues to benefit from intensive therapies in a post acute setting to improve strength and function before returning home.      Follow Up Recommendations  CIR     Equipment Recommendations  None recommended by PT    Recommendations for Other Services Rehab consult     Precautions / Restrictions Precautions Precautions: Fall;Other (comment) Precaution Comments: watch SPO2/HR Restrictions Weight Bearing Restrictions: No    Mobility  Bed Mobility Overal bed mobility: Needs Assistance Bed Mobility: Supine to Sit     Supine to sit: Mod assist;+2 for physical assistance     General bed mobility comments: Pt required assistance to advance LEs to edge of bed and to elevate trunk into sitting edge of bed.  Pt able to maintain sitting for 7 min before getting up to standing. Focus placed on upper trunk control and forward gaze to improve head  control.    Transfers Overall transfer level: Needs assistance Equipment used: Rolling walker (2 wheeled) Transfers: Sit to/from Stand Sit to Stand: Mod assist;+2 physical assistance         General transfer comment: Mod assistance +2 to boost into standing.  Pt remains with weak quads and buckling noted in standing.  Returned to seated position after buckling and opted for stand pivot from bed to chair.    Ambulation/Gait Ambulation/Gait assistance: Mod assist;+2 physical assistance Gait Distance (Feet): (steps to recliner chair buckling noted and quickly returned to  seated position as she approached recliner chair.  ) Assistive device: Rolling walker (2 wheeled) Gait Pattern/deviations: Shuffle;Trunk flexed     General Gait Details: Remains weak in B quads for formal gait training, only tolerating steps from bed to recliner chair with +2 external assistance.     Stairs             Wheelchair Mobility    Modified Rankin (Stroke Patients Only)       Balance     Sitting balance-Leahy Scale: Poor       Standing balance-Leahy Scale: Poor                              Cognition Arousal/Alertness: Awake/alert Behavior During Therapy: Flat affect Overall Cognitive Status: Impaired/Different from baseline Area of Impairment: Orientation;Problem solving                 Orientation Level: Disoriented to;Time  Problem Solving: Slow processing;Decreased initiation        Exercises Total Joint Exercises Towel Squeeze: AROM;Both;10 reps;Supine General Exercises - Lower Extremity Ankle Circles/Pumps: AROM;Both;10 reps Quad Sets: AROM;Both;10 reps;Supine Heel Slides: AAROM;Both;10 reps;Supine    General Comments        Pertinent Vitals/Pain Pain Assessment: Faces Faces Pain Scale: Hurts even more Pain Location: stomach; back Pain Descriptors / Indicators: Grimacing;Guarding;Sore Pain Intervention(s): Monitored during  session;Repositioned;Patient requesting pain meds-RN notified;Limited activity within patient's tolerance    Home Living                      Prior Function            PT Goals (current goals can now be found in the care plan section) Acute Rehab PT Goals Patient Stated Goal: Hopes to be able to go home Potential to Achieve Goals: Fair Progress towards PT goals: Progressing toward goals    Frequency    Min 3X/week      PT Plan Current plan remains appropriate    Co-evaluation              AM-PAC PT "6 Clicks" Daily Activity  Outcome Measure  Difficulty turning over in bed (including adjusting bedclothes, sheets and blankets)?: Unable Difficulty moving from lying on back to sitting on the side of the bed? : Unable Difficulty sitting down on and standing up from a chair with arms (e.g., wheelchair, bedside commode, etc,.)?: Unable Help needed moving to and from a bed to chair (including a wheelchair)?: A Lot Help needed walking in hospital room?: A Lot Help needed climbing 3-5 steps with a railing? : Total 6 Click Score: 8    End of Session Equipment Utilized During Treatment: Gait belt Activity Tolerance: Patient limited by pain;Patient limited by fatigue(limited due to strength) Patient left: in chair;with call bell/phone within reach;with family/visitor present Nurse Communication: Mobility status PT Visit Diagnosis: Muscle weakness (generalized) (M62.81) Pain - Right/Left: (abdomen)     Time: 5974-7185 PT Time Calculation (min) (ACUTE ONLY): 31 min  Charges:  $Therapeutic Activity: 23-37 mins                     Governor Rooks, PTA Acute Rehabilitation Services Pager [phone] Office [phone]     Jeyson Deshotel Eli Hose 06/19/2018, 5:54 PM

## 2018-06-19 NOTE — Progress Notes (Signed)
    Progress Note   Subjective  Patient continues to feel very weak, some upper abdominal soreness, having bowel movements were dark but not grossly bloody Daughter reports concern of blank staring and tremor Tolerating low-fat diet Getting additional 2 units of packed red cells today    Objective  Vital signs in last 24 hours: Temp:  [98 F (36.7 C)-98.8 F (37.1 C)] 98.2 F (36.8 C) (10/14 1229) Pulse Rate:  [67-78] 71 (10/14 1229) Resp:  [14-16] 14 (10/14 1229) BP: (150-170)/(48-58) 170/48 (10/14 1229) SpO2:  [100 %] 100 % (10/14 1229) Weight:  [74.1 kg] 74.1 kg (10/14 0600) Last BM Date: 06/19/18(rectal pouch) Gen: awake, chronically ill-appearing in no acute distress HEENT: anicteric, op clear CV: RRR, no mrg Pulm: CTA b/l Abd: soft, upper abdominal tenderness without rebound or guarding, +BS throughout Ext: no c/c, 1+ LE edema bilateral Neuro: nonfocal, tremor without clear asterixis    Intake/Output from previous day: 10/13 0701 - 10/14 0700 In: 460 [P.O.:460] Out: 1300 [Urine:1300] Intake/Output this shift: Total I/O In: 315 [Blood:315] Out: -   Lab Results: Recent Labs    06/17/18 1159 06/18/18 0348 06/19/18 0500  WBC 18.1* 16.3* 14.1*  HGB 7.4* 7.3* 6.8*  HCT 23.4* 23.3* 21.8*  PLT 231 231 226   BMET Recent Labs    06/17/18 0611 06/18/18 0348 06/19/18 0500  NA 142 137 137  K 5.0 4.2 3.9  CL 105 104 103  CO2 _0 GLUCOSE 150* 119* 99  BUN 133* 121* 109*  CREATININE 2.30* 2.17* 2.21*  CALCIUM 8.6* 8.5* 8.2*      Assessment & Plan  67 year old with complicated necrotizing pancreatitis initially felt biliary status post laproscopic cholecystectomy 04/19/2018, severe protein calorie malnutrition, recent upper GI bleed found to have esophageal ulceration felt secondary to enteral feeding tube  1.  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 Rush Landmark. Continue supportive care; now tolerating oral diet Slowly improving from this issue  2.  GI bleeding with anemia--esophageal ulcer felt secondary from enteral feeding tube.  Feeding tube removed.  On high dose twice daily PPI.  Getting additional 2 units packed cells today.  Monitor closely.  Continue Carafate as well.  3.  Severe protein calorie malnutrition -enteral feeding the office after discovery of esophageal ulceration.  On regular, low-fat, diet.  Monitor intake and calorie count.  4.  CKD --stage IV and limiting the use of IV contrast media for cross-sectional imaging studies  5.  Tremor/mild disorientation per daughter --difficult for me to assess as this is the first day I have met her.  Will discuss with hospitalist team.  6.  Pressure ulceration -should be addressed/managed by wound care team    LOS: 22 days   Jerene Bears  06/19/2018, 2:12 PM

## 2018-06-19 NOTE — Progress Notes (Signed)
Nutrition Follow-up  DOCUMENTATION CODES:   Severe malnutrition in context of chronic illness  INTERVENTION:   - Provide feeding assistance with all meals  - Continue Boost Breeze po TID, each supplement provides 250 kcal and 9 grams of protein  - Encourage adequate PO intake  - Changed diet to Dysphagia 2 as pt does not have teeth and would like food to come pre-chopped  - Provided menu orientation  NUTRITION DIAGNOSIS:   Severe Malnutrition related to chronic illness (CHF, AAA s/p graft repair, pancreatitis) as evidenced by severe muscle depletion, moderate muscle depletion, mild fat depletion, moderate fat depletion, energy intake < or equal to 75% for > or equal to 1 month.  Ongoing  GOAL:   Patient will meet greater than or equal to 90% of their needs  Progressing  MONITOR:   Diet advancement, Labs, TF tolerance, I & O's, Weight trends  REASON FOR ASSESSMENT:   Consult Enteral/tube feeding initiation and management  ASSESSMENT:   Patient with PMH significant for HTN, HLD, DM, COPD, GERD, gout, depression with anxiety, tobacco abuse, AAA s/p stent graft repair 01/2018, CAD, CABG, CHF, CKD IV s/p AV fistula for anticipated transition to HD, GI bleeding, mitral valve regurgitation, PAD, and s/p lap chole 04/19/18. Presents this admission with abdominal pain. Admitted for recurrent vs never resolved acute pancreatitis and GIB.   9/23 - Cortrak placed (tip at the pylorus) 10/4 - Cortrak tube replaced (tip post-pyloric) 10/10 - pt transferred to ICU due to acute GI bleed 10/11 - s/p EGD which revealed large esophageal ulcer (unable to replace Cortrak due to this) and golf ball sized blood clot surrounding Cortrak tube tip preventing removal on 10/10, RD recommended TPN, transferred to the floor 10/12 - diet advanced to full liquid, discussion with pt's family (would not like TPN) 10/13 - diet advanced to Heart Healthy  Spoke with pt and daughter at bedside. Pt states,  "I really don't want anything" in reference to eating. RD provided a menu and orientation to the menu as pt and daughter were not aware of the always available options. Pt states she does not have teeth and has issues chewing some of the foods provided on a regular texture diet even when they are cut up. Pt agreeable to downgrading diet to Dysphagia 2. Will keep Heart Healthy modification per GI.  Pt states she had about half of grits and half of fruit plate this AM for breakfast. Noted ~25% completed breakfast meal tray at bedside.  Pt states that she is consuming "some" of the oral nutrition supplements. Pt's daughter reports pt liked the peach Boost Breeze the best. Continue Boost Breeze TID and encourage consumption.  Pt's daughter is concerned that pt will not have feeding assistance with meals if a family member is not there as pt cannot eat on her own. RD alerted RN to need for feeding assistance.  Noted pt's weight is up 39 lbs since admit. Suspect this is related to fluid status as pt is +12 L. Will continue to monitor weight trends during admission.  Meal Completion: 25-50% x last 3 meals  Medications reviewed and include: vitamin D 5000 units daily, Boost Breeze TID, Lasix 80 mg daily, sliding scale Novolog q 4 hours, Creon 12000 units TID before meals, Remeron 7.5 mg daily, Protonix 40 mg BID, Carafate 1 gram QID, vitamin B-12 100 mcg daily  Labs reviewed: BUN 109 (H), creatinine 2.21 (H), hemoglobin 6.8 (L), HCT 21.8 (L) CBG's: 112, 89, 136, 169, 169, 180  x 24 hours  UOP: 1300 ml x 24 hours I/O's: +12.7 L since admit  Diet Order:   Diet Order            DIET DYS 2 Room service appropriate? Yes; Fluid consistency: Thin  Diet effective now              EDUCATION NEEDS:   Not appropriate for education at this time  Skin:  Skin Assessment: Skin Integrity Issues: Stage I: sacrum Other: abrasion R arm, skin tear R thigh  Last BM:  10/14  Height:   Ht Readings from  Last 1 Encounters:  05/30/18 5\' 5"  (1.651 m)    Weight:   Wt Readings from Last 1 Encounters:  06/19/18 74.1 kg    Ideal Body Weight:  56.8 kg  BMI:  Body mass index is 27.18 kg/m.  Estimated Nutritional Needs:   Kcal:  1700-1900 kcal  Protein:  85-100 g  Fluid:  >/= 1.7 L/day    Gaynell Face, MS, RD, LDN Inpatient Clinical Dietitian Pager: [phone] Weekend/After Hours: [phone]

## 2018-06-19 NOTE — Clinical Social Work Note (Signed)
Clinical Social Work Assessment  Patient Details  Name: [name] MRN: [id number] Date of Birth: [birth year]  Date of referral:  06/19/18               Reason for consult:  Facility Placement                Permission sought to share information with:  Facility Sport and exercise psychologist, Family Supports Permission granted to share information::  Yes, Verbal Permission Granted  Name::     Agricultural engineer::  SNFs  Relationship::  Daughter  Contact Information:  [phone]  Housing/Transportation Living arrangements for the past 2 months:  [place] of Information:  Adult Children, Patient Patient Interpreter Needed:  None Criminal Activity/Legal Involvement Pertinent to Current Situation/Hospitalization:  No - Comment as needed Significant Relationships:  Adult Children Lives with:  Adult Children Do you feel safe going back to the place where you live?  No Need for family participation in patient care:  Yes (Comment)  Care giving concerns:  CSW received consult for possible SNF placement at time of discharge. CSW spoke with patient's daughter regarding PT recommendation of SNF placement at time of discharge. Patient's daughter reported that she and her sister are currently unable to care for patient at their home given patient's current physical needs and fall risk. Patient expressed understanding of PT recommendation and is agreeable to SNF placement at time of discharge. CSW to continue to follow and assist with discharge planning needs.   Social Worker assessment / plan:  CSW spoke with patient and daughter concerning possibility of rehab at [facility], [place] before returning home.  Employment status:  Retired Nurse, adult PT Recommendations:  [place] / Referral to community resources:  [place]  Patient/Family's Response to care:  Patient recognizes need for rehab before returning home and is agreeable  to a SNF in [place]. CSW reaching out to SNF to see which is in network with [employer], since patient had trouble being placed before.   Patient/Family's Understanding of and Emotional Response to Diagnosis, Current Treatment, and Prognosis:  Patient/family is realistic regarding therapy needs and expressed being hopeful for SNF placement. Patient expressed understanding of CSW role and discharge process as well as medical condition. No questions/concerns about plan or treatment.    Emotional Assessment Appearance:  Appears stated age Attitude/Demeanor/Rapport:  Lethargic Affect (typically observed):  Quiet Orientation:  Oriented to Self, Oriented to Place, Oriented to  Time, Oriented to Situation Alcohol / Substance use:  Not Applicable Psych involvement (Current and /or in the community):  No (Comment)  Discharge Needs  Concerns to be addressed:  Care Coordination Readmission within the last 30 days:  No Current discharge risk:  None Barriers to Discharge:  Continued Medical Work up   [employer], LCSW 06/19/2018, 12:40 PM

## 2018-06-19 NOTE — Procedures (Signed)
ELECTROENCEPHALOGRAM REPORT   Patient: [name]       Room #: 8O87N EEG No. ID: [id number] Age: 67 y.o.        Sex: female Referring Physician: Blaine Hamper Report Date:  06/19/2018        Interpreting Physician: Alexis Goodell  History: [name] is an 67 y.o. female with tremors and starring spells  Medications:  Xanax, Coreg, Vitamin D, Cymbalta, Aranesp, Neurontin, Insulin, Imdur, Creon, Remeron, Dulera, Protonix, Carafate, B12  Conditions of Recording:  This is a 21 channel routine scalp EEG performed with bipolar and monopolar montages arranged in accordance to the international 10/20 system of electrode placement. One channel was dedicated to EKG recording.  The patient is in the awake and drowsy states.  Description:  Muscle and movement artifact are prominent during the recording particularly while the patient is awake.  When able to be evaluated the waking background activity consists of a low voltage, symmetrical, fairly well organized, 7 Hz theta activity, seen from the parieto-occipital and posterior temporal regions.  Low voltage fast activity, poorly organized, is seen anteriorly and is at times superimposed on more posterior regions.  A mixture of theta and alpha rhythms are seen from the central and temporal regions. The patient drowses with slowing to irregular, low voltage theta and beta activity.   Stage II sleep is not obtained. No epileptiform activity is noted.   Hyperventilation and intermittent photic stimulation were not performed.   IMPRESSION: This is an abnormal EEG secondary to posterior background slowing.  This finding may be seen with a diffuse gray matter disturbance that is etiologically nonspecific, but may include a dementia, among other possibilities.  No epileptiform activity is noted.      Alexis Goodell, MD Neurology [phone] 06/19/2018, 5:55 PM

## 2018-06-19 NOTE — Progress Notes (Signed)
[facility] Progress Note   Subjective:   1. 2 L UOP yesterday, pt w/o any new c/o's.  Very fatigued.    Objective Vitals:   06/19/18 0600 06/19/18 0813 06/19/18 0822 06/19/18 0837  BP:  (!) 163/58  (!) 165/56  Pulse:  77 78   Resp:  15 16   Temp:  98 F (36.7 C)  98.2 F (36.8 C)  TempSrc:  Oral  Oral  SpO2:  100% 100%   Weight: 74.1 kg     Height:       Physical Exam General: chronically ill appearing Heart: no rub, RRR Lungs: normal WOB, dec BS bases Abdomen: soft, mildly TTP epigastrium Extremities:  Anasarca 4 limbs, 2+ pitting; LUE AVF +t/b  Additional Objective Labs: Basic Metabolic Panel: Recent Labs  Lab 06/13/18 0526  06/17/18 0611 06/18/18 0348 06/19/18 0500  NA 140   < > 142 137 137  K 5.4*   < > 5.0 4.2 3.9  CL 103   < > 105 104 103  CO2 24   < > 25 25 25   GLUCOSE 82   < > 150* 119* 99  BUN 57*   < > 133* 121* 109*  CREATININE 2.32*   < > 2.30* 2.17* 2.21*  CALCIUM 9.0   < > 8.6* 8.5* 8.2*  PHOS 5.6*  --   --   --   --    < > = values in this interval not displayed.   Liver Function Tests: No results for input(s): AST, ALT, ALKPHOS, BILITOT, PROT, ALBUMIN in the last 168 hours. No results for input(s): LIPASE, AMYLASE in the last 168 hours. CBC: Recent Labs  Lab 06/15/18 0456  06/17/18 0611 06/17/18 1159 06/18/18 0348 06/19/18 0500  WBC 11.7*  --  18.2* 18.1* 16.3* 14.1*  HGB 6.1*   < > 7.9* 7.4* 7.3* 6.8*  HCT 20.5*   < > 25.0* 23.4* 23.3* 21.8*  MCV 102.5*  --  97.7 98.3 100.4* 101.9*  PLT 332  --  234 231 231 226   < > = values in this interval not displayed.   Blood Culture    Component Value Date/Time   SDES BLOOD RIGHT HAND 06/05/2018 1453   SPECREQUEST  06/05/2018 1453    BOTTLES DRAWN AEROBIC ONLY Blood Culture adequate volume   CULT  06/05/2018 1453    NO GROWTH 5 DAYS Performed at [facility], [place] [address], [place], [place] [address]    REPTSTATUS 06/10/2018 FINAL 06/05/2018 1453    Cardiac  Enzymes: No results for input(s): CKTOTAL, CKMB, CKMBINDEX, TROPONINI in the last 168 hours. CBG: Recent Labs  Lab 06/18/18 2040 06/19/18 0024 06/19/18 0401 06/19/18 0804 06/19/18 1201  GLUCAP 169* 136* 89 112* 144*   Iron Studies: No results for input(s): IRON, TIBC, TRANSFERRIN, FERRITIN in the last 72 hours. @lablastinr3 @ Studies/Results: No results found. Medications: . sodium chloride 250 mL (06/16/18 0821)  . sodium chloride Stopped (06/17/18 0000)   . sodium chloride   Intravenous Once  . sodium chloride   Intravenous Once  . ALPRAZolam  0.5 mg Oral QHS  . aspirin EC  81 mg Oral Daily  . carvedilol  12.5 mg Oral BID WC  . cholecalciferol  5,000 Units Oral Daily  . darbepoetin (ARANESP) injection - NON-DIALYSIS  60 mcg Subcutaneous Q Fri-1800  . darbepoetin (ARANESP) injection - NON-DIALYSIS  60 mcg Subcutaneous Q Mon-1800  . DULoxetine  60 mg Oral Daily  . feeding supplement  1 Container Oral TID BM  . furosemide  20 mg Intravenous Once  . furosemide  80 mg Oral Daily  . gabapentin  200 mg Oral BID  . insulin aspart  0-9 Units Subcutaneous Q4H  . isosorbide mononitrate  15 mg Oral Daily  . lipase/protease/amylase  12,000 Units Oral TID AC  . mouth rinse  15 mL Mouth Rinse BID  . mirtazapine  7.5 mg Oral QHS  . mometasone-formoterol  2 puff Inhalation BID  . pantoprazole sodium  40 mg Oral BID  . sucralfate  1 g Oral TID WC & HS  . vitamin B-12  100 mcg Per Tube Daily    Assessment/Plan: 1.  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  Follow[facility].   Has LUV AVF.    Renal function stable after gastrointestinal hemorrhage Donah Driver due to upper GIB, creat stable 1.9 - 2.2) which is baseline.   Resumed lasix at 80 daily, can ^ to bid if needed  Patient daughter will sched f/u w/DR Posey Pronto after dc   2.  HTN: labile, CTM; resume amlodipine if remains elevated  3.  Hyperkalemia:  Recent potassiums improved.    4.  Anemia:   Recent acute blood loss anemia.  Received 40 mcg of Aranesp on 10/11.  For now continue weekly  5. Will sign off, please call as needed.   Kelly Splinter MD [employer] pgr [phone]   06/19/2018, 12:27 PM      [facility]

## 2018-06-19 NOTE — Progress Notes (Signed)
PROGRESS NOTE    [name]  ELF:[id number] DOB: 1950-11-08 DOA: 05/28/2018 PCP: Bernerd Limbo, MD    Brief Narrative: 66 year old woman PMH gallstone pancreatitis August 2019, status post laparoscopic cholecystectomy same hospitalization, presented with abdominal pain, nausea, vomiting.  Admitted for recurrent pancreatitis.  She was seen by gastroenterology, cortrak tube was placed and she was started on enteral feeds.  Continuation has been very slow to improve and was complicated by a mild ileus, acute kidney injury and anasarca.  Plan at this point is to continue diuresis as tolerated, advance diet as per GI and general surgery, likely home, possibly with tube feeds, towards the end of the week  Assessment & Plan:   Principal Problem:   Pancreatitis, recurrent Active Problems:   TOBACCO ABUSE   Essential hypertension   Atrial fibrillation (HCC)   COPD (chronic obstructive pulmonary disease) (HCC)   S/P CABG x 3   Anemia   GERD (gastroesophageal reflux disease)   Hyperlipidemia   CAD (coronary artery disease)   Chronic kidney disease (CKD), stage IV (severe) (HCC)   Chronic diastolic congestive heart failure (HCC)   Anemia due to GI blood loss   Type II diabetes mellitus with renal manifestations (HCC)   SIRS (systemic inflammatory response syndrome) (HCC)   Protein-calorie malnutrition, severe   Anasarca   Demand ischemia (HCC)   Hyperkalemia   AKI (acute kidney injury) ([place])   Goals of care, counseling/discussion   Palliative care by specialist   Hematemesis   Pressure injury of skin   Esophageal ulcer with bleeding   GI bleed, Acute Blood loss anemia;  She was hypotensive and hb decreased to 5.0--6 Transfer to ICU, CCM consulted. Now back to step down unit.  Received IV fluids, and 3 units PRBC and 2 units on 10-14. On  protonix , Carafate started.  Hold plavix for 24 to 48 hours. 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 He relates we can change plavix to aspirin when stable from bleeding point of view.  Ok to resume plavix per GI, after discussion with Dr Donzetta Matters on 10-13 aspirin was resume.  Hb down to 6.8, asked GI to comment on if we should continue with aspirin.  Plan to get 2 units of PRBC>    HTN;  Continue with carvedilol, imdur low dose.   Urine retention;  Place foley catheter to avoid worsening renal function.    Leukocytosis;  Check UA, clean.  Check chest x ray. Negative for infection.  Afebrile.  Trending down.   Acute pancreatitis, recurrent vs unresolved. Developing pseudocyst.  Had  tube feeding, below ligament of treitz. Tube feeding removed today, it cause esophageal ulceration.  S/P cholecystectomy 8-14 Management per GI.   Tolerating heart healthy diet,    Mild Hyperkalemia;  Resolved.   Severe protein caloric malnutrition;  Tube feeding removed.  Full liquid diet.  Discussed with family option for TPN, they would like to hold on starting TPN.   Mild ileus; resolved. Sx was following.   AKI on CKD stage IV;  Nephrology following  On roal lasix 80 mg daily.   Anasarca secondary to hypoalbuminemia, aggressive volume resuscitation Received IV lasix.  On oral lasix.   Right upper extremity with worsening edema, redness, Cellulitis Repeated  Doppler showed superficial cephalic vein thrombosis. Local care. . Started doxy to treat for cellulitis, received 2 days. 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  Colonoscopy planned October 28.  Was not iron deficient August.  A fib; stable.  Continue with carvedilol.   COPD, chronic hypoxic respiratory failure on oxygen at home --Remains stable.  Demand ischemia.  PMH CAD, CABG. --Cardiology recommended medical management, consider outpatient stress test.    DVT prophylaxis: SCD Code Status: full code.  Family  Communication: care discussed with multiples family. Daughter would like to aplliative care team to follow on patient.  Disposition Plan: advance diet, monitor hb.  Blood transfusion today.  Consultants:   Sx  GI  Nephrology    Procedures:  cortrak feeding tube placement on 9/23  Transfusion 1 unit PRBC Echo; Normal LVF with mild BSH, mild MR and TR, mild LVE, grade 3 diastolic dysfunction.. S/P mitral valve repair with moderate MS. No significant change from prior echo The right ventricular systolic pressure was increased consistent with mild pulmonary  hypertension.   Antimicrobials: merrem.    Subjective: Patient complaining of buttock pain. Will remove rectal tube.  Also pain in her leg.  Will increase Vicodin to 1- 2 tablets for pain.    Objective: Vitals:   06/19/18 0600 06/19/18 0813 06/19/18 0822 06/19/18 0837  BP:  (!) 163/58  (!) 165/56  Pulse:  77 78   Resp:  15 16   Temp:  98 F (36.7 C)  98.2 F (36.8 C)  TempSrc:  Oral  Oral  SpO2:  100% 100%   Weight: 74.1 kg     Height:        Intake/Output Summary (Last 24 hours) at 06/19/2018 1122 Last data filed at 06/19/2018 0404 Gross per 24 hour  Intake 280 ml  Output 1300 ml  Net -1020 ml   Filed Weights   06/16/18 0311 06/18/18 0411 06/19/18 0600  Weight: 68.4 kg 73.1 kg 74.1 kg    Examination:  General exam: Alert  Respiratory system; CTA Cardiovascular system: S 1, S 2 RRR Gastrointestinal system: BS present, soft, nt Central nervous system: non focal.  Extremities: Right arm with less redness, plus 2 edema   Data Reviewed: I have personally reviewed following labs and imaging studies  CBC: Recent Labs  Lab 06/15/18 0456  06/16/18 1919 06/17/18 0611 06/17/18 1159 06/18/18 0348 06/19/18 0500  WBC 11.7*  --   --  18.2* 18.1* 16.3* 14.1*  HGB 6.1*   < > 11.4* 7.9* 7.4* 7.3* 6.8*  HCT 20.5*   < > 35.2* 25.0* 23.4* 23.3* 21.8*  MCV 102.5*  --   --  97.7 98.3 100.4* 101.9*   PLT 332  --   --  234 231 231 226   < > = values in this interval not displayed.   Basic Metabolic Panel: Recent Labs  Lab 06/13/18 0526  06/15/18 0456 06/16/18 0433 06/17/18 0611 06/18/18 0348 06/19/18 0500  NA 140   < > 142 144 142 137 137  K 5.4*   < > 5.2* 5.4* 5.0 4.2 3.9  CL 103   < > 100 106 105 104 103  CO2 24   < > 32 29 25 25 25   GLUCOSE 82   < > 191* 141* 150* 119* 99  BUN 57*   < > 78* 119* 133* 121* 109*  CREATININE 2.32*   < > 1.87* 1.99* 2.30* 2.17* 2.21*  CALCIUM 9.0   < > 8.6* 8.2* 8.6* 8.5* 8.2*  MG 2.1  --   --   --   --   --   --  PHOS 5.6*  --   --   --   --   --   --    < > = values in this interval not displayed.   GFR: Estimated Creatinine Clearance: 25.6 mL/min (A) (by C-G formula based on SCr of 2.21 mg/dL (H)). Liver Function Tests: No results for input(s): AST, ALT, ALKPHOS, BILITOT, PROT, ALBUMIN in the last 168 hours. No results for input(s): LIPASE, AMYLASE in the last 168 hours. No results for input(s): AMMONIA in the last 168 hours. Coagulation Profile: Recent Labs  Lab 06/15/18 0837  INR 1.28   Cardiac Enzymes: No results for input(s): CKTOTAL, CKMB, CKMBINDEX, TROPONINI in the last 168 hours. BNP (last 3 results) No results for input(s): PROBNP in the last 8760 hours. HbA1C: No results for input(s): HGBA1C in the last 72 hours. CBG: Recent Labs  Lab 06/18/18 1725 06/18/18 2040 06/19/18 0024 06/19/18 0401 06/19/18 0804  GLUCAP 169* 169* 136* 89 112*   Lipid Profile: No results for input(s): CHOL, HDL, LDLCALC, TRIG, CHOLHDL, LDLDIRECT in the last 72 hours. Thyroid Function Tests: No results for input(s): TSH, T4TOTAL, FREET4, T3FREE, THYROIDAB in the last 72 hours. Anemia Panel: No results for input(s): VITAMINB12, FOLATE, FERRITIN, TIBC, IRON, RETICCTPCT in the last 72 hours. Sepsis Labs: No results for input(s): PROCALCITON, LATICACIDVEN in the last 168 hours.  No results found for this or any previous visit (from the  past 240 hour(s)).       Radiology Studies: Dg Chest Port 1 View  Result Date: 06/17/2018 CLINICAL DATA:  Leukocytosis EXAM: PORTABLE CHEST 1 VIEW COMPARISON:  06/15/2018 FINDINGS: Cardiac shadow remains mildly enlarged. Lungs are well aerated bilaterally. The degree of vascular congestion has improved in the interval. No new focal infiltrate is noted. No bony abnormality is seen. IMPRESSION: Improved vascular congestion when compare with the prior exam. Electronically Signed   By: Inez Catalina M.D.   On: 06/17/2018 13:43        Scheduled Meds: . sodium chloride   Intravenous Once  . sodium chloride   Intravenous Once  . ALPRAZolam  0.5 mg Oral QHS  . aspirin EC  81 mg Oral Daily  . carvedilol  12.5 mg Oral BID WC  . cholecalciferol  5,000 Units Oral Daily  . darbepoetin (ARANESP) injection - NON-DIALYSIS  60 mcg Subcutaneous Q Fri-1800  . darbepoetin (ARANESP) injection - NON-DIALYSIS  60 mcg Subcutaneous Q Mon-1800  . DULoxetine  60 mg Oral Daily  . feeding supplement  1 Container Oral TID BM  . furosemide  20 mg Intravenous Once  . furosemide  80 mg Oral Daily  . gabapentin  200 mg Oral BID  . insulin aspart  0-9 Units Subcutaneous Q4H  . isosorbide mononitrate  15 mg Oral Daily  . lipase/protease/amylase  12,000 Units Oral TID AC  . mouth rinse  15 mL Mouth Rinse BID  . mirtazapine  7.5 mg Oral QHS  . mometasone-formoterol  2 puff Inhalation BID  . pantoprazole sodium  40 mg Oral BID  . sucralfate  1 g Oral TID WC & HS  . vitamin B-12  100 mcg Per Tube Daily   Continuous Infusions: . sodium chloride 250 mL (06/16/18 0821)  . sodium chloride Stopped (06/17/18 0000)     LOS: 22 days    Time spent: 19 minutes./     Elmarie Shiley, MD [facility] Pager [phone]  If 7PM-7AM, please contact night-coverage [email or web address] Password [email or web address] 06/19/2018, 11:22 AM

## 2018-06-19 NOTE — Progress Notes (Addendum)
PROGRESS NOTE    [name]  MEQ:[id number] DOB: 03-Jan-1951 DOA: 05/28/2018 PCP: Bernerd Limbo, MD    Brief Narrative: 67 year old woman PMH gallstone pancreatitis August 2019, status post laparoscopic cholecystectomy same hospitalization, presented with abdominal pain, nausea, vomiting.  Admitted for recurrent pancreatitis.  She was seen by gastroenterology, cortrak tube was placed and she was started on enteral feeds.  Continuation has been very slow to improve and was complicated by a mild ileus, acute kidney injury and anasarca.  Plan at this point is to continue diuresis as tolerated, advance diet as per GI and general surgery, likely home, possibly with tube feeds, towards the end of the week  Assessment & Plan:   Principal Problem:   Pancreatitis, recurrent Active Problems:   TOBACCO ABUSE   Essential hypertension   Atrial fibrillation (HCC)   COPD (chronic obstructive pulmonary disease) (HCC)   S/P CABG x 3   Anemia   GERD (gastroesophageal reflux disease)   Hyperlipidemia   CAD (coronary artery disease)   Chronic kidney disease (CKD), stage IV (severe) (HCC)   Chronic diastolic congestive heart failure (HCC)   Anemia due to GI blood loss   Type II diabetes mellitus with renal manifestations (HCC)   SIRS (systemic inflammatory response syndrome) (HCC)   Protein-calorie malnutrition, severe   Anasarca   Demand ischemia (HCC)   Hyperkalemia   AKI (acute kidney injury) ([place])   Goals of care, counseling/discussion   Palliative care by specialist   Hematemesis   Pressure injury of skin   Esophageal ulcer with bleeding   GI bleed, Acute Blood loss anemia;  She was hypotensive and hb decreased to 5.0--6 Transfer to ICU, CCM consulted. Now back to step down unit.  Received IV fluids, and 3 units PRBC and 2 units on 10-14. On  protonix , Carafate started.  Hold plavix for 24 to 48 hours. 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 He relates we can change plavix to aspirin when stable from bleeding point of view.  Ok to resume plavix per GI, after discussion with Dr Donzetta Matters on 10-13 aspirin was resume.  Hb down to 6.8, asked GI to comment on if we should continue with aspirin.  Plan to get 2 units of PRBC>    HTN;  Continue with carvedilol, imdur low dose.   Urine retention;  Place foley catheter to avoid worsening renal function.    Leukocytosis;  Check UA, clean.  Check chest x ray. Negative for infection.  Afebrile.  Trending down.   Acute pancreatitis, recurrent vs unresolved. Developing pseudocyst.  Had  tube feeding, below ligament of treitz. Tube feeding removed today, it cause esophageal ulceration.  S/P cholecystectomy 8-14 Management per GI.   Tolerating heart healthy diet,    Mild Hyperkalemia;  Resolved.   Severe protein caloric malnutrition;  Tube feeding removed.  Full liquid diet.  Discussed with family option for TPN, they would like to hold on starting TPN.   Mild ileus; resolved. Sx was following.   AKI on CKD stage IV;  Nephrology following  On roal lasix 80 mg daily.   Anasarca secondary to hypoalbuminemia, aggressive volume resuscitation Received IV lasix.  On oral lasix.   Right upper extremity with worsening edema, redness, Cellulitis Repeated  Doppler showed superficial cephalic vein thrombosis. Local care. . Started doxy to treat for cellulitis, received 2 days. 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  Colonoscopy planned October 28.  Was not iron deficient August.  A fib; stable.  Continue with carvedilol.   COPD, chronic hypoxic respiratory failure on oxygen at home --Remains stable.  Demand ischemia.  PMH CAD, CABG. --Cardiology recommended medical management, consider outpatient stress test.  Tremors, disorientation, Blanks spells.  Per family report.    Will get MRI and EEG.   DVT prophylaxis: SCD Code Status: full code.  Family Communication: care discussed with multiples family. Daughter would like to aplliative care team to follow on patient.  Disposition Plan: advance diet, monitor hb.  Blood transfusion today.  Consultants:   Sx  GI  Nephrology    Procedures:  cortrak feeding tube placement on 9/23  Transfusion 1 unit PRBC Echo; Normal LVF with mild BSH, mild MR and TR, mild LVE, grade 3 diastolic dysfunction.. S/P mitral valve repair with moderate MS. No significant change from prior echo The right ventricular systolic pressure was increased consistent with mild pulmonary  hypertension.   Antimicrobials: merrem.    Subjective: Patient complaining of buttock pain. Will remove rectal tube.  Also pain in her leg.  Will increase Vicodin to 1- 2 tablets for pain.    Objective: Vitals:   06/19/18 0813 06/19/18 0822 06/19/18 0837 06/19/18 1229  BP: (!) 163/58  (!) 165/56 (!) 170/48  Pulse: 77 78    Resp: 15 16    Temp: 98 F (36.7 C)  98.2 F (36.8 C) 98.2 F (36.8 C)  TempSrc: Oral  Oral Oral  SpO2: 100% 100%    Weight:      Height:        Intake/Output Summary (Last 24 hours) at 06/19/2018 1348 Last data filed at 06/19/2018 1203 Gross per 24 hour  Intake 315 ml  Output 1300 ml  Net -985 ml   Filed Weights   06/16/18 0311 06/18/18 0411 06/19/18 0600  Weight: 68.4 kg 73.1 kg 74.1 kg    Examination:  General exam: Alert  Respiratory system; CTA Cardiovascular system: S 1, S 2 RRR Gastrointestinal system: BS present, soft, nt Central nervous system: non focal.  Extremities: Right arm with less redness, plus 2 edema   Data Reviewed: I have personally reviewed following labs and imaging studies  CBC: Recent Labs  Lab 06/15/18 0456  06/16/18 1919 06/17/18 0611 06/17/18 1159 06/18/18 0348 06/19/18 0500  WBC 11.7*  --   --  18.2* 18.1* 16.3* 14.1*  HGB 6.1*   < > 11.4* 7.9*  7.4* 7.3* 6.8*  HCT 20.5*   < > 35.2* 25.0* 23.4* 23.3* 21.8*  MCV 102.5*  --   --  97.7 98.3 100.4* 101.9*  PLT 332  --   --  234 231 231 226   < > = values in this interval not displayed.   Basic Metabolic Panel: Recent Labs  Lab 06/13/18 0526  06/15/18 0456 06/16/18 0433 06/17/18 0611 06/18/18 0348 06/19/18 0500  NA 140   < > 142 144 142 137 137  K 5.4*   < > 5.2* 5.4* 5.0 4.2 3.9  CL 103   < > 100 106 105 104 103  CO2 24   < > 32 29 25 25 25   GLUCOSE 82   < > 191* 141* 150* 119* 99  BUN 57*   < > 78* 119* 133* 121* 109*  CREATININE 2.32*   < > 1.87* 1.99* 2.30* 2.17* 2.21*  CALCIUM 9.0   < > 8.6* 8.2*  8.6* 8.5* 8.2*  MG 2.1  --   --   --   --   --   --   PHOS 5.6*  --   --   --   --   --   --    < > = values in this interval not displayed.   GFR: Estimated Creatinine Clearance: 25.6 mL/min (A) (by C-G formula based on SCr of 2.21 mg/dL (H)). Liver Function Tests: No results for input(s): AST, ALT, ALKPHOS, BILITOT, PROT, ALBUMIN in the last 168 hours. No results for input(s): LIPASE, AMYLASE in the last 168 hours. No results for input(s): AMMONIA in the last 168 hours. Coagulation Profile: Recent Labs  Lab 06/15/18 0837  INR 1.28   Cardiac Enzymes: No results for input(s): CKTOTAL, CKMB, CKMBINDEX, TROPONINI in the last 168 hours. BNP (last 3 results) No results for input(s): PROBNP in the last 8760 hours. HbA1C: No results for input(s): HGBA1C in the last 72 hours. CBG: Recent Labs  Lab 06/18/18 2040 06/19/18 0024 06/19/18 0401 06/19/18 0804 06/19/18 1201  GLUCAP 169* 136* 89 112* 144*   Lipid Profile: No results for input(s): CHOL, HDL, LDLCALC, TRIG, CHOLHDL, LDLDIRECT in the last 72 hours. Thyroid Function Tests: No results for input(s): TSH, T4TOTAL, FREET4, T3FREE, THYROIDAB in the last 72 hours. Anemia Panel: No results for input(s): VITAMINB12, FOLATE, FERRITIN, TIBC, IRON, RETICCTPCT in the last 72 hours. Sepsis Labs: No results for input(s):  PROCALCITON, LATICACIDVEN in the last 168 hours.  No results found for this or any previous visit (from the past 240 hour(s)).       Radiology Studies: No results found.      Scheduled Meds: . sodium chloride   Intravenous Once  . sodium chloride   Intravenous Once  . ALPRAZolam  0.5 mg Oral QHS  . aspirin EC  81 mg Oral Daily  . carvedilol  12.5 mg Oral BID WC  . cholecalciferol  5,000 Units Oral Daily  . darbepoetin (ARANESP) injection - NON-DIALYSIS  60 mcg Subcutaneous Q Fri-1800  . darbepoetin (ARANESP) injection - NON-DIALYSIS  60 mcg Subcutaneous Q Mon-1800  . DULoxetine  60 mg Oral Daily  . feeding supplement  1 Container Oral TID BM  . furosemide  80 mg Oral Daily  . gabapentin  200 mg Oral BID  . insulin aspart  0-9 Units Subcutaneous Q4H  . isosorbide mononitrate  15 mg Oral Daily  . lipase/protease/amylase  12,000 Units Oral TID AC  . mouth rinse  15 mL Mouth Rinse BID  . mirtazapine  7.5 mg Oral QHS  . mometasone-formoterol  2 puff Inhalation BID  . pantoprazole sodium  40 mg Oral BID  . sucralfate  1 g Oral TID WC & HS  . vitamin B-12  100 mcg Per Tube Daily   Continuous Infusions: . sodium chloride 250 mL (06/16/18 0821)  . sodium chloride Stopped (06/17/18 0000)     LOS: 22 days    Time spent: 31 minutes./     Elmarie Shiley, MD [facility] Pager [phone]  If 7PM-7AM, please contact night-coverage [email or web address] Password [email or web address] 06/19/2018, 1:48 PM

## 2018-06-19 NOTE — NC FL2 (Signed)
[place] MEDICAID FL2 LEVEL OF CARE SCREENING TOOL     IDENTIFICATION  Patient Name: [name] Birthdate: 01-08-1951 Sex: female Admission Date (Current Location): 05/28/2018  [facility] and [place] Number:  Herbalist and Address:  [facility] [place], [place] [address] [place], [place] [address]      Provider Number: [id number]  Attending Physician Name and Address:  Elmarie Shiley, MD  Relative Name and Phone Number:  [name] daughter, [phone]    Current Level of Care: Hospital Recommended Level of Care: [place] Prior Approval Number:    Date Approved/Denied:   PASRR Number: [id number]  Discharge Plan: SNF    Current Diagnoses: Patient Active Problem List   Diagnosis Date Noted  . Esophageal ulcer with bleeding   . Pressure injury of skin 06/16/2018  . Hematemesis   . AKI (acute kidney injury) ([place]) 06/09/2018  . Goals of care, counseling/discussion   . Palliative care by specialist   . Hyperkalemia 06/08/2018  . Anasarca 06/07/2018  . Demand ischemia ([place]) 06/07/2018  . Protein-calorie malnutrition, severe 05/29/2018  . Type II diabetes mellitus with renal manifestations ([place]) 05/28/2018  . Pancreatitis, recurrent 05/28/2018  . SIRS (systemic inflammatory response syndrome) ([place]) 05/28/2018  . Pancreatitis 05/28/2018  . Volume overload 04/17/2018  . Anemia due to GI blood loss 04/14/2018  . Acute gallstone pancreatitis 04/14/2018  . Acute cholecystitis 04/14/2018  . Gastrointestinal hemorrhage   . Fatigue associated with anemia 04/09/2018  . Hypoglycemia 01/19/2018  . Type II diabetes mellitus ([place]) 01/19/2018  . Nausea with vomiting 01/19/2018  . Uncontrolled hypertension 01/19/2018  . Hyperlipidemia 01/19/2018  . CAD (coronary artery disease) 01/19/2018  . COPD with hypoxia ([place]) 01/19/2018  . Chronic kidney disease (CKD), stage IV (severe) ([place]) 01/19/2018  . Retroperitoneal hematoma 01/19/2018   . Acute blood loss anemia 01/19/2018  . Chronic diastolic congestive heart failure ([place]) 01/19/2018  . AAA (abdominal aortic aneurysm) ([place]) 01/16/2018  . Elevated brain natriuretic peptide (BNP) level 10/07/2015  . Diabetes mellitus ([place]) 10/07/2015  . Pain in the chest   . PAD (peripheral artery disease) ([place]) 07/29/2015  . Chest pain 07/04/2015  . Acute on chronic kidney failure ([place]) 07/04/2015  . GERD (gastroesophageal reflux disease) 07/04/2015  . Anemia 06/26/2015  . Coronary artery disease involving autologous vein coronary bypass graft with unstable angina pectoris ([place]) 06/24/2015  . Angina at rest [facility])   . Subclavian artery stenosis, left ([place]) 06/23/2015  . AAA (abdominal aortic aneurysm) without rupture ([place]) 12/10/2014  . Subclavian steal syndrome 12/10/2014  . Renal artery stenosis ([place]) 09/04/2013  . Carotid artery stenosis 09/28/2012  . Mitral valve disorder 06/23/2011  . S/P CABG x 3 06/16/2011  . S/P Maze operation for atrial fibrillation 06/16/2011  . S/P MVR (mitral valve repair) 06/16/2011  . Follow-up examination following surgery 03/29/2011  . CKD (chronic kidney disease) 03/03/2011  . COPD (chronic obstructive pulmonary disease) ([place]) 03/03/2011  . Chronic diastolic heart failure ([place]) 02/26/2011  . Hypotension 02/26/2011  . NSTEMI (non-ST elevated myocardial infarction) ([place]) 02/26/2011  . Long term (current) use of anticoagulants 12/17/2010  . OBSTRUCTIVE SLEEP APNEA 09/04/2010  . HYPOXEMIA 09/04/2010  . SNORING 08/05/2010  . Atrial fibrillation ([place]) 12/09/2009  . WEIGHT LOSS 03/28/2009  . PALPITATIONS 03/28/2009  . PVD 11/21/2008  . HYPERLIPIDEMIA-MIXED 11/20/2008  . TOBACCO ABUSE 11/20/2008  . Essential hypertension 11/20/2008  . Coronary artery disease involving native heart with angina pectoris ([place]) 11/20/2008  . ABDOMINAL  AORTIC ANEURYSM 11/20/2008    Orientation RESPIRATION BLADDER Height & Weight     Self, Time, Situation, Place   Normal Incontinent Weight: 74.1 kg Height:  5\' 5"  (165.1 cm)  BEHAVIORAL SYMPTOMS/MOOD NEUROLOGICAL BOWEL NUTRITION STATUS      Incontinent(Rectal tube) Diet(Please see DC Summary)  AMBULATORY STATUS COMMUNICATION OF NEEDS Skin   Extensive Assist Verbally PU Stage and Appropriate Care(Stage I on sacrum)                       Personal Care Assistance Level of Assistance  Bathing, Feeding, Dressing Bathing Assistance: Maximum assistance Feeding assistance: Limited assistance Dressing Assistance: Limited assistance     Functional Limitations Info  Sight, Hearing, Speech Sight Info: Adequate Hearing Info: Adequate Speech Info: Adequate    SPECIAL CARE FACTORS FREQUENCY  PT (By licensed PT), OT (By licensed OT)     PT Frequency: 5x/week OT Frequency: 3x/week            Contractures Contractures Info: Not present    Additional Factors Info  Code Status, Allergies, Psychotropic, Insulin Sliding Scale Code Status Info: Full Allergies Info: Allergies:  Isosorbide, Isosorbide Nitrate, Oxycodone-acetaminophen, Codeine, Contrast Media Iodinated Diagnostic Agents, Ioxaglate, Metrizamide, Percocet Oxycodone-acetaminophen Psychotropic Info: Xanax;  Insulin Sliding Scale Info: Every 4 hours       Current Medications (06/19/2018):  This is the current hospital active medication list Current Facility-Administered Medications  Medication Dose Route Frequency Provider Last Rate Last Dose  . 0.9 %  sodium chloride infusion (Manually program via Guardrails IV Fluids)   Intravenous Once Regalado, Belkys A, MD      . 0.9 %  sodium chloride infusion (Manually program via Guardrails IV Fluids)   Intravenous Once Blount, Xenia T, NP      . 0.9 %  sodium chloride infusion  250 mL Intravenous Continuous Thornton Park, MD 10 mL/hr at 06/16/18 0821 250 mL at 06/16/18 0821  . acetaminophen (TYLENOL) tablet 650 mg  650 mg Oral Q6H PRN Thornton Park, MD   650 mg at 06/18/18 1748   Or  .  acetaminophen (TYLENOL) suppository 650 mg  650 mg Rectal Q6H PRN Thornton Park, MD      . ALPRAZolam Duanne Moron) tablet 0.5 mg  0.5 mg Oral Jake Shark, MD   0.5 mg at 06/18/18 2317  . alum & mag hydroxide-simeth (MAALOX/MYLANTA) 200-200-20 MG/5ML suspension 30 mL  30 mL Oral Q6H PRN Thornton Park, MD   30 mL at 06/02/18 2329  . aspirin EC tablet 81 mg  81 mg Oral Daily Regalado, Belkys A, MD   81 mg at 06/19/18 0846  . carvedilol (COREG) tablet 12.5 mg  12.5 mg Oral BID WC Regalado, Belkys A, MD   12.5 mg at 06/19/18 0847  . cholecalciferol (VITAMIN D) tablet 5,000 Units  5,000 Units Oral Daily Thornton Park, MD   5,000 Units at 06/19/18 0844  . Darbepoetin Alfa (ARANESP) injection 60 mcg  60 mcg Subcutaneous Q Fri-1800 Pearson Grippe B, MD      . Darbepoetin Alfa (ARANESP) injection 60 mcg  60 mcg Subcutaneous Q Mon-1800 Regalado, Belkys A, MD      . DULoxetine (CYMBALTA) DR capsule 60 mg  60 mg Oral Daily Thornton Park, MD   60 mg at 06/19/18 0848  . feeding supplement (BOOST / RESOURCE BREEZE) liquid 1 Container  1 Container Oral TID BM Regalado, Belkys A, MD   1 Container at 06/18/18 2053  . furosemide (LASIX) injection  20 mg  20 mg Intravenous Once Regalado, Belkys A, MD      . furosemide (LASIX) tablet 80 mg  80 mg Oral Daily Pearson Grippe B, MD   80 mg at 06/19/18 0844  . gabapentin (NEURONTIN) capsule 200 mg  200 mg Oral BID Regalado, Belkys A, MD   200 mg at 06/19/18 0846  . hydrALAZINE (APRESOLINE) injection 10 mg  10 mg Intravenous Q4H PRN Thornton Park, MD   10 mg at 06/16/18 2214  . HYDROcodone-acetaminophen (NORCO/VICODIN) 5-325 MG per tablet 1-2 tablet  1-2 tablet Oral Q6H PRN Regalado, Belkys A, MD      . hydrOXYzine (ATARAX/VISTARIL) tablet 10 mg  10 mg Oral TID PRN Thornton Park, MD      . insulin aspart (novoLOG) injection 0-9 Units  0-9 Units Subcutaneous Q4H Thornton Park, MD   1 Units at 06/19/18 0052  . isosorbide mononitrate (IMDUR) 24 hr  tablet 15 mg  15 mg Oral Daily Regalado, Belkys A, MD   15 mg at 06/19/18 0849  . levalbuterol (XOPENEX) nebulizer solution 1.25 mg  1.25 mg Nebulization Q6H PRN Thornton Park, MD      . lipase/protease/amylase (CREON) capsule 12,000 Units  12,000 Units Oral TID Crissie Sickles, MD   12,000 Units at 06/19/18 0846  . MEDLINE mouth rinse  15 mL Mouth Rinse BID Thornton Park, MD   15 mL at 06/18/18 0935  . mirtazapine (REMERON) tablet 7.5 mg  7.5 mg Oral QHS Thornton Park, MD   7.5 mg at 06/18/18 2317  . mometasone-formoterol (DULERA) 200-5 MCG/ACT inhaler 2 puff  2 puff Inhalation BID Thornton Park, MD   2 puff at 06/19/18 0847  . ondansetron (ZOFRAN) injection 4 mg  4 mg Intravenous Q6H PRN Thornton Park, MD   4 mg at 06/15/18 0516  . pantoprazole sodium (PROTONIX) 40 mg/20 mL oral suspension 40 mg  40 mg Oral BID Kipp Brood, MD   40 mg at 06/19/18 0919  . phenol (CHLORASEPTIC) mouth spray 1 spray  1 spray Mouth/Throat PRN Thornton Park, MD   1 spray at 06/14/18 1901  . polyethylene glycol (MIRALAX / GLYCOLAX) packet 17 g  17 g Oral Daily PRN Thornton Park, MD      . sodium chloride 0.9 % bolus 250 mL  250 mL Intravenous Once Neila Gear, NP   Stopped at 06/17/18 0000  . sucralfate (CARAFATE) 1 GM/10ML suspension 1 g  1 g Oral TID WC & HS Thornton Park, MD   1 g at 06/19/18 0850  . vitamin B-12 (CYANOCOBALAMIN) tablet 100 mcg  100 mcg Per Tube Daily Thornton Park, MD   100 mcg at 06/19/18 2111     Discharge Medications: Please see discharge summary for a list of discharge medications.  Relevant Imaging Results:  Relevant Lab Results:   Additional Information SS# [id number]  Benard Halsted, LCSW

## 2018-06-19 NOTE — Progress Notes (Signed)
CRITICAL VALUE ALERT  Critical Value:  Hemoglobin 6.8  Date & Time Notied:  06/19/2018 1438  Provider Notified: Jeannette Corpus, NP   Orders Received/Actions taken: Awaiting response

## 2018-06-19 NOTE — Consult Note (Addendum)
[facility] Nurse wound consult note Reason for Consult: Consult requested for buttocks. Pt is reported to have a stage 1 pressure injury to buttocks. This is currently covered with a rectal pouch to attempt to contain a large amt of liquid stool which is tarry..  Wound location and description is consistent with moisture associated skin damage related to frequent stools, and application of a rectal pouch to contain and attempt to protect the skin is appropriate for topiucal treatment.  If this does not remain in place, then barrier cream can be applied to protect and repel moisture.  No famiuly present to discuss plan of care. Please re-consult if further assistance is needed.  Thank-you,  Julien Girt MSN, [place], [place], [place], [place]

## 2018-06-19 NOTE — Progress Notes (Signed)
EEG completed, results pending. 

## 2018-06-20 ENCOUNTER — Telehealth: Payer: Self-pay

## 2018-06-20 ENCOUNTER — Inpatient Hospital Stay (HOSPITAL_COMMUNITY): Payer: Medicare Other

## 2018-06-20 DIAGNOSIS — Z515 Encounter for palliative care: Secondary | ICD-10-CM

## 2018-06-20 DIAGNOSIS — E1122 Type 2 diabetes mellitus with diabetic chronic kidney disease: Secondary | ICD-10-CM

## 2018-06-20 DIAGNOSIS — I214 Non-ST elevation (NSTEMI) myocardial infarction: Secondary | ICD-10-CM

## 2018-06-20 DIAGNOSIS — Z7189 Other specified counseling: Secondary | ICD-10-CM

## 2018-06-20 LAB — GLUCOSE, CAPILLARY
GLUCOSE-CAPILLARY: 111 mg/dL — AB (ref 70–99)
GLUCOSE-CAPILLARY: 93 mg/dL (ref 70–99)
Glucose-Capillary: 111 mg/dL — ABNORMAL HIGH (ref 70–99)
Glucose-Capillary: 156 mg/dL — ABNORMAL HIGH (ref 70–99)
Glucose-Capillary: 123 mg/dL — ABNORMAL HIGH (ref 70–99)
Glucose-Capillary: 102 mg/dL — ABNORMAL HIGH (ref 70–99)

## 2018-06-20 LAB — TYPE AND SCREEN
ABO/RH(D): B POS
Antibody Screen: NEGATIVE
UNIT DIVISION: 0
UNIT DIVISION: 0

## 2018-06-20 LAB — CBC
HCT: 31.3 % — ABNORMAL LOW (ref 36.0–46.0)
HEMOGLOBIN: 9.8 g/dL — AB (ref 12.0–15.0)
MCH: 29.9 pg (ref 26.0–34.0)
MCHC: 31.3 g/dL (ref 30.0–36.0)
MCV: 95.4 fL (ref 80.0–100.0)
Platelets: 220 10*3/uL (ref 150–400)
RBC: 3.28 MIL/uL — ABNORMAL LOW (ref 3.87–5.11)
RDW: 20.1 % — ABNORMAL HIGH (ref 11.5–15.5)
WBC: 15.3 10*3/uL — ABNORMAL HIGH (ref 4.0–10.5)
nRBC: 0.1 % (ref 0.0–0.2)

## 2018-06-20 LAB — BPAM RBC
Blood Product Expiration Date: 201911122359
Blood Product Expiration Date: 201911122359
ISSUE DATE / TIME: 201910141512
ISSUE DATE / TIME: 201910140809
UNIT TYPE AND RH: 7300
Unit Type and Rh: 7300

## 2018-06-20 LAB — BASIC METABOLIC PANEL
Anion gap: 12 (ref 5–15)
BUN: 100 mg/dL — ABNORMAL HIGH (ref 8–23)
CHLORIDE: 102 mmol/L (ref 98–111)
CO2: 24 mmol/L (ref 22–32)
CREATININE: 2.21 mg/dL — AB (ref 0.44–1.00)
Calcium: 8.3 mg/dL — ABNORMAL LOW (ref 8.9–10.3)
GFR calc Af Amer: 26 mL/min — ABNORMAL LOW (ref >60)
GFR calc non Af Amer: 22 mL/min — ABNORMAL LOW (ref >60)
Glucose, Bld: 110 mg/dL — ABNORMAL HIGH (ref 70–99)
Potassium: 3.4 mmol/L — ABNORMAL LOW (ref 3.5–5.1)
Sodium: 138 mmol/L (ref 135–145)

## 2018-06-20 MED ORDER — POTASSIUM CHLORIDE CRYS ER 20 MEQ PO TBCR
20.0000 meq | EXTENDED_RELEASE_TABLET | Freq: Once | ORAL | Status: AC
  Administered 2018-06-20: 20 meq via ORAL
  Filled 2018-06-20: qty 1

## 2018-06-20 MED ORDER — NICOTINE 14 MG/24HR TD PT24
14.0000 mg | MEDICATED_PATCH | Freq: Every day | TRANSDERMAL | Status: DC
  Administered 2018-06-20 – 2018-06-26 (×7): 14 mg via TRANSDERMAL
  Filled 2018-06-20 (×7): qty 1

## 2018-06-20 MED ORDER — VITAMIN B-12 100 MCG PO TABS
100.0000 ug | ORAL_TABLET | Freq: Every day | ORAL | Status: DC
  Administered 2018-06-20 – 2018-06-26 (×7): 100 ug via ORAL
  Filled 2018-06-20 (×6): qty 1

## 2018-06-20 MED ORDER — ASPIRIN EC 81 MG PO TBEC
81.0000 mg | DELAYED_RELEASE_TABLET | Freq: Every day | ORAL | Status: DC
  Administered 2018-06-20 – 2018-06-26 (×7): 81 mg via ORAL
  Filled 2018-06-20 (×7): qty 1

## 2018-06-20 MED ORDER — TAMSULOSIN HCL 0.4 MG PO CAPS
0.4000 mg | ORAL_CAPSULE | Freq: Every day | ORAL | Status: DC
  Administered 2018-06-20 – 2018-06-26 (×7): 0.4 mg via ORAL
  Filled 2018-06-20 (×7): qty 1

## 2018-06-20 NOTE — Progress Notes (Signed)
Clinical Social Worker following patient for support and discharge needs. Patient has bed at [facility] and authorization through [facility]. CSW will continue to follow for support.  Rhea Pink, MSW,  [place]

## 2018-06-20 NOTE — Progress Notes (Signed)
Physical Therapy Treatment Patient Details Name: [name] MRN: [id number] DOB: 03/02/1951 Today's Date: 06/20/2018    History of Present Illness Pt is a 66 y/o female with c/o of abdominal pain. Note recent hospitalization 04/09/18 to 04/21/18 with GI bleed, gallstone pancreatitis, and during which she received laparoscopic cholecystectomy. Diagnosed with recurrent pancreatitis. PMH AAA, CHF, chronic back pain, CKD, COPD, GI bleed, HTN, MI, A-fib, DM, cardiac cath, CABG, maze procedure, mitral valve repair, R femoral-popliteal bypass.  Pt's course was complicated by hypotension and vomiting blood.  She had a significant drop in Hgb into the 5s.  She required several units of PRBCs and was transferred to the ICU.  She underwent an EGD on 06/16/18 which revealed and 8 mm esophageal ulcer.      PT Comments    Pt performed transfer from bed to standing in sara stedy.  No evidence of buckling and performed increased standing tolerance.  Plan next session for standing with RW and progression to gait training.  Grip strength remains weak.    Follow Up Recommendations  CIR     Equipment Recommendations  None recommended by PT    Recommendations for Other Services Rehab consult     Precautions / Restrictions Precautions Precautions: Fall;Other (comment) Precaution Comments: watch SPO2/HR Restrictions Weight Bearing Restrictions: No    Mobility  Bed Mobility Overal bed mobility: Needs Assistance Bed Mobility: Supine to Sit     Supine to sit: Mod assist;+2 for physical assistance     General bed mobility comments: Pt required assistance to advance LEs to edge of bed and to elevate trunk into sitting edge of bed.   Transfers Overall transfer level: Needs assistance Equipment used: (sara stedy) Transfers: Sit to/from Stand Sit to Stand: Mod assist;+2 physical assistance Stand pivot transfers: Mod assist       General transfer comment: Mod assistance +2 to boost into standing.   no buckling noted and able to stand for 4-5 min before sitting to take a rest break.    Ambulation/Gait Ambulation/Gait assistance:deferred.                 Stairs             Wheelchair Mobility    Modified Rankin (Stroke Patients Only)       Balance Overall balance assessment: Needs assistance Sitting-balance support: Feet supported;Bilateral upper extremity supported Sitting balance-Leahy Scale: Poor Sitting balance - Comments: requires cues to maintain upright posture, tends to progress to forward flexion with fatigue   Standing balance support: Bilateral upper extremity supported Standing balance-Leahy Scale: Poor Standing balance comment: reliant on B UE support                             Cognition Arousal/Alertness: Awake/alert Behavior During Therapy: Flat affect Overall Cognitive Status: Impaired/Different from baseline Area of Impairment: Orientation;Problem solving                 Orientation Level: Disoriented to;Time           Problem Solving: Slow processing;Decreased initiation General Comments: pt overall oriented, disoriented to month only; slower processing noted; daughter often answering for pt       Exercises General Exercises - Lower Extremity Ankle Circles/Pumps: AROM;Both;10 reps Quad Sets: AROM;Both;10 reps;Supine Heel Slides: AAROM;Both;10 reps;Supine Hip ABduction/ADduction: AROM;AAROM;Both;10 reps;Supine    General Comments        Pertinent Vitals/Pain Pain Assessment: Faces Faces Pain Scale: Hurts even  more Pain Location: stomach; back Pain Descriptors / Indicators: Grimacing;Guarding;Sore Pain Intervention(s): Monitored during session;Repositioned    Home Living                      Prior Function            PT Goals (current goals can now be found in the care plan section) Acute Rehab PT Goals Patient Stated Goal: Hopes to be able to go home Potential to Achieve Goals:  Fair Progress towards PT goals: Progressing toward goals    Frequency    Min 3X/week      PT Plan Current plan remains appropriate    Co-evaluation              AM-PAC PT "6 Clicks" Daily Activity  Outcome Measure  Difficulty turning over in bed (including adjusting bedclothes, sheets and blankets)?: Unable Difficulty moving from lying on back to sitting on the side of the bed? : Unable Difficulty sitting down on and standing up from a chair with arms (e.g., wheelchair, bedside commode, etc,.)?: Unable Help needed moving to and from a bed to chair (including a wheelchair)?: A Lot Help needed walking in hospital room?: A Lot Help needed climbing 3-5 steps with a railing? : Total 6 Click Score: 8    End of Session Equipment Utilized During Treatment: Gait belt Activity Tolerance: Patient limited by pain;Patient limited by fatigue;Patient tolerated treatment well Patient left: in chair;with call bell/phone within reach;with family/visitor present Nurse Communication: Mobility status PT Visit Diagnosis: Muscle weakness (generalized) (M62.81) Pain - Right/Left: (abdomen) Pain - part of body: (abdomen)     Time: 1535-1610 PT Time Calculation (min) (ACUTE ONLY): 35 min  Charges:  $Therapeutic Exercise: 8-22 mins $Therapeutic Activity: 8-22 mins                     Governor Rooks, PTA Acute Rehabilitation Services Pager [phone] Office [phone]     Cathline Dowen Eli Hose 06/20/2018, 5:28 PM

## 2018-06-20 NOTE — Progress Notes (Signed)
Daily Progress Note   Patient Name: [name]       Date: 06/20/2018 DOB: 09-26-1950  Age: 67 y.o. MRN#: [id number] Attending Physician: Elmarie Shiley, MD Primary Care Physician: Bernerd Limbo, MD Admit Date: 05/28/2018  Reason for Consultation/Follow-up: Establishing goals of care  Subjective: Patient sitting up in bed. Awake, A&O x3. Reports RLQ discomfort which she reports is manageable and not new. States she continues to feel weak and that her legs feel heavy. Foley and rectal tube remains in place. Tolerating po's without complications.   Daughter, [name] is at the bedside. We met and had a detailed discussion with both her and her sister's concerns in regards to patient now being so deconditioned. [name] voiced how they are concerned that their mother may not recover despite rehabilitation. [name] also expresses how she has worked with multiple patients and have some medical knowledge from a social work standpoint, and is not naive to what the future could potentially look like. She is worried that her mom may require more long-term care, if she doesn't regain strength at least near baseline. Both her and her sister work full-time and care for their families and would be unable to care for their mother on a full-time basis if it came to that. I educated daughter that if their current goals is to continue with full aggressive care and to proceed with SNF for rehabilitation allowing her mother the opportunity to show signs of improvement with palliative support would be the most appropriate option. I also educated daughters on options if they comfort was more of their focus. Patient advised that patient is A&Ox3 and at this point she is able to make her own medical decisions. Daughter  agreed and voiced they too are not considering comfort and want to proceed with aggressive measures but just had concerns and needed to hear options. [name] also expressed that her and her sister felt their mother should be a DNR.   We discussed care with patient at bedside. Patient verbalized her knowledge of increased weakness and that she could not return home at this time due to safety and deconditioning. She voiced she wanted to go home but she know she would have to get better and stronger in order for that to happen. She also voiced that she knew her level of  care required more needs than her daughters could be handle at this point. I also discussed in detail her current full code status in consideration of her current illness and co-morbidities. Patient and family verbalized understanding. Ms. Urbanczyk expressed that she did not want to change her status to DNR/DNI at this time, however she expressed that she was ok with being coded but if there was a chance that she would not survive or be on life-support for more than a day then she would not want to continue. She states she went through this with her father and her husband. They both were coded and intubated and later the decision was made to terminally extubate and they both passed away. Daughter expressed to her mother she felt with her health conditions that she should be a DNR/DNI and not have to go through that process with awareness of how she dealt with the situation with her father and also how she and her sister felt. Ms. Andrzejewski expressed to her daughter "I am not ready to make that decision and you and your sister knows what I want and can make a decision at that time." Daughter verbalized understanding and support of her decision. Support given.   Patient and daughter verbalizes wishes to have outpatient palliative services at discharge.   Daughter and patient knows to call with questions or concerns.   Length of Stay: 23  Current  Medications: Scheduled Meds:  . sodium chloride   Intravenous Once  . sodium chloride   Intravenous Once  . ALPRAZolam  0.5 mg Oral QHS  . carvedilol  12.5 mg Oral BID WC  . cholecalciferol  5,000 Units Oral Daily  . darbepoetin (ARANESP) injection - NON-DIALYSIS  60 mcg Subcutaneous Q Mon-1800  . DULoxetine  60 mg Oral Daily  . feeding supplement  1 Container Oral TID BM  . furosemide  80 mg Oral Daily  . gabapentin  200 mg Oral BID  . insulin aspart  0-9 Units Subcutaneous Q4H  . isosorbide mononitrate  15 mg Oral Daily  . lipase/protease/amylase  12,000 Units Oral TID AC  . mouth rinse  15 mL Mouth Rinse BID  . mirtazapine  7.5 mg Oral QHS  . mometasone-formoterol  2 puff Inhalation BID  . pantoprazole sodium  40 mg Oral BID  . sucralfate  1 g Oral TID WC & HS  . vitamin B-12  100 mcg Oral Daily    Continuous Infusions: . sodium chloride 250 mL (06/16/18 0821)  . sodium chloride Stopped (06/17/18 0000)    PRN Meds: acetaminophen **OR** acetaminophen, alum & mag hydroxide-simeth, hydrALAZINE, HYDROcodone-acetaminophen, hydrOXYzine, levalbuterol, ondansetron (ZOFRAN) IV, phenol, polyethylene glycol  Physical Exam  Constitutional: She is oriented to person, place, and time. Vital signs are normal. She is cooperative. She appears ill.  Frail and chronically ill appearing   Cardiovascular: Normal rate, regular rhythm and normal heart sounds. Exam reveals decreased pulses.  Bilateral upper/lower extremity edema  Pulmonary/Chest: Effort normal. She has decreased breath sounds.  Abdominal: Soft. Normal appearance and bowel sounds are normal.  Musculoskeletal:  Generalized weakness, reports legs feeling heavy  Neurological: She is alert and oriented to person, place, and time.  Skin: Skin is warm and dry. Bruising noted.  Sacral ulceration   Psychiatric: Judgment normal. Cognition and memory are normal.  Nursing note and vitals reviewed.           Vital Signs: BP (!) 165/65  (BP Location: Right Arm)   Pulse 68  Temp 97.8 F (36.6 C) (Oral)   Resp 13   Ht '5\' 5"'$  (1.651 m)   Wt 74.1 kg   SpO2 99%   BMI 27.18 kg/m  SpO2: SpO2: 99 % O2 Device: O2 Device: Nasal Cannula O2 Flow Rate: O2 Flow Rate (L/min): 2 L/min  Intake/output summary:   Intake/Output Summary (Last 24 hours) at 06/20/2018 1040 Last data filed at 06/20/2018 0000 Gross per 24 hour  Intake 630 ml  Output 800 ml  Net -170 ml   LBM: Last BM Date: 06/20/18 Baseline Weight: Weight: 56.6 kg Most recent weight: Weight: 74.1 kg  Palliative Assessment/Data: PPS 20%   Flowsheet Rows     Most Recent Value  Intake Tab  Referral Department  Hospitalist  Unit at Time of Referral  Cardiac/Telemetry Unit  Palliative Care Primary Diagnosis  Sepsis/Infectious Disease  Date Notified  06/08/18  Palliative Care Type  New Palliative care  Reason for referral  Clarify Goals of Care  Date of Admission  05/28/18  Date first seen by Palliative Care  06/09/18  # of days Palliative referral response time  1 Day(s)  # of days IP prior to Palliative referral  11  Clinical Assessment  Palliative Performance Scale Score  20%  Psychosocial & Spiritual Assessment  Palliative Care Outcomes  Patient/Family meeting held?  Yes  Who was at the meeting?  2 daughters, sister  Palliative Care Outcomes  Clarified goals of care, Provided psychosocial or spiritual support      Patient Active Problem List   Diagnosis Date Noted  . Esophageal ulcer with bleeding   . Pressure injury of skin 06/16/2018  . Hematemesis   . AKI (acute kidney injury) ([place]) 06/09/2018  . Goals of care, counseling/discussion   . Palliative care by specialist   . Hyperkalemia 06/08/2018  . Anasarca 06/07/2018  . Demand ischemia ([place]) 06/07/2018  . Protein-calorie malnutrition, severe 05/29/2018  . Type II diabetes mellitus with renal manifestations ([place]) 05/28/2018  . Pancreatitis, recurrent 05/28/2018  . SIRS (systemic inflammatory  response syndrome) ([place]) 05/28/2018  . Pancreatitis 05/28/2018  . Volume overload 04/17/2018  . Anemia due to GI blood loss 04/14/2018  . Acute gallstone pancreatitis 04/14/2018  . Acute cholecystitis 04/14/2018  . Gastrointestinal hemorrhage   . Fatigue associated with anemia 04/09/2018  . Hypoglycemia 01/19/2018  . Type II diabetes mellitus ([place]) 01/19/2018  . Nausea with vomiting 01/19/2018  . Uncontrolled hypertension 01/19/2018  . Hyperlipidemia 01/19/2018  . CAD (coronary artery disease) 01/19/2018  . COPD with hypoxia ([place]) 01/19/2018  . Chronic kidney disease (CKD), stage IV (severe) ([place]) 01/19/2018  . Retroperitoneal hematoma 01/19/2018  . Acute blood loss anemia 01/19/2018  . Chronic diastolic congestive heart failure ([place]) 01/19/2018  . AAA (abdominal aortic aneurysm) ([place]) 01/16/2018  . Elevated brain natriuretic peptide (BNP) level 10/07/2015  . Diabetes mellitus ([place]) 10/07/2015  . Pain in the chest   . PAD (peripheral artery disease) ([place]) 07/29/2015  . Chest pain 07/04/2015  . Acute on chronic kidney failure ([place]) 07/04/2015  . GERD (gastroesophageal reflux disease) 07/04/2015  . Anemia 06/26/2015  . Coronary artery disease involving autologous vein coronary bypass graft with unstable angina pectoris ([place]) 06/24/2015  . Angina at rest [facility])   . Subclavian artery stenosis, left ([place]) 06/23/2015  . AAA (abdominal aortic aneurysm) without rupture ([place]) 12/10/2014  . Subclavian steal syndrome 12/10/2014  . Renal artery stenosis ([place]) 09/04/2013  . Carotid artery stenosis 09/28/2012  . Mitral valve disorder 06/23/2011  . S/P  CABG x 3 06/16/2011  . S/P Maze operation for atrial fibrillation 06/16/2011  . S/P MVR (mitral valve repair) 06/16/2011  . Follow-up examination following surgery 03/29/2011  . CKD (chronic kidney disease) 03/03/2011  . COPD (chronic obstructive pulmonary disease) ([place]) 03/03/2011  . Chronic diastolic heart failure ([place]) 02/26/2011  .  Hypotension 02/26/2011  . NSTEMI (non-ST elevated myocardial infarction) ([place]) 02/26/2011  . Long term (current) use of anticoagulants 12/17/2010  . OBSTRUCTIVE SLEEP APNEA 09/04/2010  . HYPOXEMIA 09/04/2010  . SNORING 08/05/2010  . Atrial fibrillation ([place]) 12/09/2009  . WEIGHT LOSS 03/28/2009  . PALPITATIONS 03/28/2009  . PVD 11/21/2008  . HYPERLIPIDEMIA-MIXED 11/20/2008  . TOBACCO ABUSE 11/20/2008  . Essential hypertension 11/20/2008  . Coronary artery disease involving native heart with angina pectoris ([place]) 11/20/2008  . ABDOMINAL AORTIC ANEURYSM 11/20/2008    Palliative Care Assessment & Plan   Patient Profile: 68 y.o. female  with past medical history of hypertension, hyperlipidemia, DM, COPD on home oxygen, GERD, gout, depression, anxiety, tobacco abuse, atrial fibrillation not on anticoagulants, AAA, CAD, CABG, CHF, CKD4, GI bleeding, mitral valve regurgitation, PAD  admitted on 05/28/2018 with abdominal pain. Patient was recently admitted at the beginning of August d/t GI bleed and gallstone pancreatitis. She had laparoscopic cholecystectomy during that hospitalization.  This admission, diagnosed with pancreatitis - recurrent vs smoldering. CT concerning for pseudocyst and necrosis. Autoimmune pancreatitis has been ruled out. Feeding tube was placed and enteral feeds started. 10/4 tube removed and replaced post pyloric. Patient also has AKI on CKD4 with unclear etiology. Nephrology has been consulted. PMT consulted for GOC, symptom management, and support for family  Recommendations/Plan:  Full Code-patient's request  Education and thorough discussion with patient and daughter in regards to patient's illness, trajectory, and voiced concerns.   Outpatient palliative at discharge.   PMT will continue to support family and medical team during hospitalization.   Goals of Care and Additional Recommendations:  Limitations on Scope of Treatment: Full Scope Treatment  Code  Status:    Code Status Orders  (From admission, onward)         Start     Ordered   05/28/18 2223  Full code  Continuous     05/28/18 2223        Code Status History    Date Active Date Inactive Code Status Order ID Comments User Context   04/10/2018 0001 04/21/2018 1936 Full Code [id number]  Vilma Prader, MD ED   01/16/2018 2354 01/17/2018 2117 Full Code [id number]  Rosetta Posner, MD Inpatient   10/06/2015 2227 10/08/2015 1814 Full Code [id number]  Norval Morton, MD Inpatient   07/04/2015 0450 07/04/2015 1821 Full Code [id number]  Edwin Dada, MD Inpatient   06/25/2015 1611 06/26/2015 1745 Full Code [id number]  Sherren Mocha, MD Inpatient   06/24/2015 1114 06/25/2015 1611 Full Code [id number]  Serafina Mitchell, MD Inpatient   06/24/2015 1048 06/24/2015 1114 Full Code [id number]  Leonie Man, MD Inpatient     Prognosis:   Unable to determine-  Discharge Planning:  To Be Determined-Patient and family expressing interest in SNF w/rehab and outpatient palliative.   Care plan was discussed with patient, patient's family, and bedside RN.   Thank you for allowing the Palliative Medicine Team to assist in the care of this patient.   Time In: 1005 Time Out: 1115 Total Time 70 min.  Prolonged Time Billed  Yes      Greater than 50%  of this time was  spent counseling and coordinating care related to the above assessment and plan.  Alda Lea, AGPCNP-BC Palliative Medicine Team  Phone: [phone] Fax: [phone] Pager: [phone] Amion: Bjorn Pippin   Please contact Palliative Medicine Team phone at [phone] for questions and concerns.

## 2018-06-20 NOTE — Progress Notes (Signed)
PROGRESS NOTE    [name]  JOI:[id number] DOB: [birth year] DOA: 05/28/2018 PCP: Bernerd Limbo, MD    Brief Narrative: 67 year old woman PMH gallstone pancreatitis August 2019, status post laparoscopic cholecystectomy same hospitalization, presented with abdominal pain, nausea, vomiting.  Admitted for recurrent pancreatitis.  She was seen by gastroenterology, cortrak tube was placed and she was started on enteral feeds.  Continuation has been very slow to improve and was complicated by a mild ileus, acute kidney injury and anasarca. Patient subsequently develop hematemesis, GI bleed, acute blood loss anemia in setting of esophageal ulcers related to tube feeding. patient was transfer to ICU, her Hb drop to 5, and was hypotensive. She was subsequently transfer to step down unit. She has received a total of 5 units PRBC post GI bleed. She is very edematous. She was restarted on oral lasix by nephrology.  Family on 10-15 was concern with patient hands weakness, confusion and tremors, blank staring episodes. MRI was negative for stroke, EEG negative for seizure. Patient is today alert and conversant, generalized weak.   Patient report persistence right side abdominal pain, not better. Will defer to GI further imaging. Patient will need voiding trial.   Assessment & Plan:   Principal Problem:   Pancreatitis, recurrent Active Problems:   TOBACCO ABUSE   Essential hypertension   Atrial fibrillation (HCC)   COPD (chronic obstructive pulmonary disease) (HCC)   S/P CABG x 3   Anemia   GERD (gastroesophageal reflux disease)   Hyperlipidemia   CAD (coronary artery disease)   Chronic kidney disease (CKD), stage IV (severe) (HCC)   Chronic diastolic congestive heart failure (HCC)   Anemia due to GI blood loss   Type II diabetes mellitus with renal manifestations (HCC)   SIRS (systemic inflammatory response syndrome) (HCC)   Protein-calorie malnutrition, severe   Anasarca   Demand ischemia  (HCC)   Hyperkalemia   AKI (acute kidney injury) ([place])   Goals of care, counseling/discussion   Palliative care by specialist   Hematemesis   Pressure injury of skin   Esophageal ulcer with bleeding   GI bleed, Acute Blood loss anemia;  She was hypotensive and hb decreased to 5.0--6 Transfer to ICU, CCM consulted. Now back to step down unit.  Received IV fluids, and 3 units PRBC and 2 units on 10-14. On  protonix , Carafate started.  Hold plavix for 24 to 48 hours. 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 He relates we can change plavix to aspirin when stable from bleeding point of view.  Ok to resume plavix per GI, after discussion with Dr Donzetta Matters on 10-13 aspirin was resume.  Hb down to 6.8, asked GI to comment on if we should continue with aspirin.  Plan to get 2 units of PRBC>    HTN;  Continue with carvedilol, imdur low dose.   Urine retention;  Place foley catheter to avoid worsening renal function.    Leukocytosis;  Check UA, clean.  Check chest x ray. Negative for infection.  Afebrile.  Trending down.   Acute pancreatitis, recurrent vs unresolved. Developing pseudocyst.  Had  tube feeding, below ligament of treitz. Tube feeding removed today, it cause esophageal ulceration.  S/P cholecystectomy 8-14 Management per GI.   Tolerating heart healthy diet,   persistent abdominal pain   Mild Hyperkalemia;  Resolved.   Severe protein caloric malnutrition;  Tube feeding removed.  Full liquid diet.  Discussed with family option for TPN, they would like to hold on starting TPN.   Mild ileus; resolved. Sx was following.   AKI on CKD stage IV;  Nephrology following  On roal lasix 80 mg daily.   Anasarca secondary to hypoalbuminemia, aggressive volume resuscitation Received IV lasix.  On oral lasix.   Right upper extremity with worsening edema, redness,  Cellulitis Repeated  Doppler showed superficial cephalic vein thrombosis. Local care. . Started doxy to treat for cellulitis, received 2 days. 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  Colonoscopy planned October 28.  Was not iron deficient August.  A fib; stable.  Continue with carvedilol.   COPD, chronic hypoxic respiratory failure on oxygen at home --Remains stable.  Demand ischemia.  PMH CAD, CABG. --Cardiology recommended medical management, consider outpatient stress test.  Tremors, disorientation, Blanks spells.  Per family report.  MRI and EEG negative.   Leokocytosis;  UA negative. Chest x ray negative.  Defer to GI imagine for pancreas.   DVT prophylaxis: SCD Code Status: full code.  Family Communication: care discussed with multiples family. Daughter would like to aplliative care team to follow on patient.  Disposition Plan: monitor hb on aspirin. Still with dark stool. Still with rectal tube.  Consultants:   Sx  GI  Nephrology    Procedures:  cortrak feeding tube placement on 9/23  Transfusion 1 unit PRBC Echo; Normal LVF with mild BSH, mild MR and TR, mild LVE, grade 3 diastolic dysfunction.. S/P mitral valve repair with moderate MS. No significant change from prior echo The right ventricular systolic pressure was increased consistent with mild pulmonary  hypertension.   Antimicrobials: merrem.    Subjective: She is alert, still complaints of abdominal pain. She denies worsening abdominal pain after eating.    Objective: Vitals:   06/20/18 0410 06/20/18 0831 06/20/18 0842 06/20/18 1242  BP:  (!) 165/65  (!) 157/47  Pulse:   68 64  Resp:      Temp: 97.8 F (36.6 C) 97.8 F (36.6 C)  98.4 F (36.9 C)  TempSrc:  Oral  Oral  SpO2:      Weight:      Height:        Intake/Output Summary (Last 24 hours) at 06/20/2018 1433 Last data filed at 06/20/2018 0000 Gross per 24 hour  Intake  315 ml  Output 800 ml  Net -485 ml   Filed Weights   06/16/18 0311 06/18/18 0411 06/19/18 0600  Weight: 68.4 kg 73.1 kg 74.1 kg    Examination:  General exam: Alert  Respiratory system; CTA Cardiovascular system: S 1, S 2 RRR Gastrointestinal system: BS present, soft, nt Central nervous system: non focal. Generalized weak.   Extremities: Bilateral arm with plus 2 edema   Data Reviewed: I have personally reviewed following labs and imaging studies  CBC: Recent Labs  Lab 06/17/18 0611 06/17/18 1159 06/18/18 0348 06/19/18 0500 06/19/18 2056 06/20/18 0844  WBC 18.2* 18.1* 16.3* 14.1*  --  15.3*  HGB 7.9* 7.4* 7.3* 6.8* 8.7* 9.8*  HCT 25.0* 23.4* 23.3* 21.8* 27.8* 31.3*  MCV 97.7 98.3 100.4* 101.9*  --  95.4  PLT 234 231 231 226  --  782   Basic Metabolic Panel: Recent Labs  Lab 06/16/18 0433 06/17/18 0611 06/18/18 0348 06/19/18 0500 06/20/18 0844  NA 144 142 137 137 138  K 5.4* 5.0 4.2 3.9 3.4*  CL 106 105 104 103 102  CO2 29  25 25 25 24   GLUCOSE 141* 150* 119* 99 110*  BUN 119* 133* 121* 109* 100*  CREATININE 1.99* 2.30* 2.17* 2.21* 2.21*  CALCIUM 8.2* 8.6* 8.5* 8.2* 8.3*   GFR: Estimated Creatinine Clearance: 25.6 mL/min (A) (by C-G formula based on SCr of 2.21 mg/dL (H)). Liver Function Tests: No results for input(s): AST, ALT, ALKPHOS, BILITOT, PROT, ALBUMIN in the last 168 hours. No results for input(s): LIPASE, AMYLASE in the last 168 hours. No results for input(s): AMMONIA in the last 168 hours. Coagulation Profile: Recent Labs  Lab 06/15/18 0837  INR 1.28   Cardiac Enzymes: No results for input(s): CKTOTAL, CKMB, CKMBINDEX, TROPONINI in the last 168 hours. BNP (last 3 results) No results for input(s): PROBNP in the last 8760 hours. HbA1C: No results for input(s): HGBA1C in the last 72 hours. CBG: Recent Labs  Lab 06/19/18 2027 06/20/18 0120 06/20/18 0409 06/20/18 0904 06/20/18 1152  GLUCAP 167* 111* 93 111* 156*   Lipid Profile: No  results for input(s): CHOL, HDL, LDLCALC, TRIG, CHOLHDL, LDLDIRECT in the last 72 hours. Thyroid Function Tests: No results for input(s): TSH, T4TOTAL, FREET4, T3FREE, THYROIDAB in the last 72 hours. Anemia Panel: No results for input(s): VITAMINB12, FOLATE, FERRITIN, TIBC, IRON, RETICCTPCT in the last 72 hours. Sepsis Labs: No results for input(s): PROCALCITON, LATICACIDVEN in the last 168 hours.  No results found for this or any previous visit (from the past 240 hour(s)).       Radiology Studies: Mr Brain Wo Contrast  Result Date: 06/20/2018 CLINICAL DATA:  Confusion EXAM: MRI HEAD WITHOUT CONTRAST TECHNIQUE: Multiplanar, multiecho pulse sequences of the brain and surrounding structures were obtained without intravenous contrast. COMPARISON:  Head CT 04/09/2018.  Brain MRI 08/31/2006 FINDINGS: Brain: No acute infarction, hemorrhage, hydrocephalus, extra-axial collection or mass lesion. Mild patchy FLAIR hyperintensity in the cerebral white matter, usually from old microvascular insults. There are remote micro hemorrhages in the right cerebral hemisphere roughly aligned along the watershed. No associated gliosis is seen. Patient has a history of endarterectomy, but this was left-sided. Unilaterally and lack of significant white matter disease argues against amyloid angiopathy. Cerebral volume loss since 2007, but mild for age. Vascular: Major flow voids are preserved Skull and upper cervical spine: No evidence of marrow lesion Sinuses/Orbits: Right cataract resection IMPRESSION: No acute or reversible finding. No specific explanation for confusion. Electronically Signed   By: Monte Fantasia M.D.   On: 06/20/2018 09:29        Scheduled Meds: . sodium chloride   Intravenous Once  . sodium chloride   Intravenous Once  . ALPRAZolam  0.5 mg Oral QHS  . aspirin EC  81 mg Oral Daily  . carvedilol  12.5 mg Oral BID WC  . cholecalciferol  5,000 Units Oral Daily  . darbepoetin (ARANESP)  injection - NON-DIALYSIS  60 mcg Subcutaneous Q Mon-1800  . DULoxetine  60 mg Oral Daily  . feeding supplement  1 Container Oral TID BM  . furosemide  80 mg Oral Daily  . gabapentin  200 mg Oral BID  . insulin aspart  0-9 Units Subcutaneous Q4H  . isosorbide mononitrate  15 mg Oral Daily  . lipase/protease/amylase  12,000 Units Oral TID AC  . mouth rinse  15 mL Mouth Rinse BID  . mirtazapine  7.5 mg Oral QHS  . mometasone-formoterol  2 puff Inhalation BID  . pantoprazole sodium  40 mg Oral BID  . sucralfate  1 g Oral TID WC & HS  .  vitamin B-12  100 mcg Oral Daily   Continuous Infusions: . sodium chloride 250 mL (06/16/18 0821)  . sodium chloride Stopped (06/17/18 0000)     LOS: 23 days    Time spent: 26 minutes./     Elmarie Shiley, MD [facility] Pager [phone]  If 7PM-7AM, please contact night-coverage [email or web address] Password [email or web address] 06/20/2018, 2:33 PM

## 2018-06-20 NOTE — Telephone Encounter (Signed)
Patient No Showed for PV. Patient was called to reschedule PV. No Answer, I was not able to leave a message due to mail box being full. A no show letter will be mailed at the end of the day, Hospital Colonoscopy will be cancelled per [place] guidelines.    Riki Sheer, LPN ( PV )

## 2018-06-20 NOTE — Progress Notes (Signed)
Occupational Therapy Treatment Patient Details Name: [name] MRN: [id number] DOB: 12/19/1950 Today's Date: 06/20/2018    History of present illness Pt is a 67 y/o female with c/o of abdominal pain. Note recent hospitalization 04/09/18 to 04/21/18 with GI bleed, gallstone pancreatitis, and during which she received laparoscopic cholecystectomy. Diagnosed with recurrent pancreatitis. PMH AAA, CHF, chronic back pain, CKD, COPD, GI bleed, HTN, MI, A-fib, DM, cardiac cath, CABG, maze procedure, mitral valve repair, R femoral-popliteal bypass.  Pt's course was complicated by hypotension and vomiting blood.  She had a significant drop in Hgb into the 5s.  She required several units of PRBCs and was transferred to the ICU.  She underwent an EGD on 06/16/18 which revealed and 8 mm esophageal ulcer.     OT comments  Pt provided with foam built up handles for utensils to increase independence with self feeding - she was able to simulate with set up assist.  Pt instructed in grip and pinch exercises using foam, and performed bil. Shoulder flexion and elbow flex/ext.  She fatigues quickly.   Follow Up Recommendations  CIR;Supervision/Assistance - 24 hour    Equipment Recommendations  None recommended by OT    Recommendations for Other Services      Precautions / Restrictions Precautions Precautions: Fall;Other (comment) Precaution Comments: watch SPO2/HR Restrictions Weight Bearing Restrictions: No       Mobility Bed Mobility Overal bed mobility: Needs Assistance Bed Mobility: Supine to Sit     Supine to sit: Mod assist;+2 for physical assistance     General bed mobility comments: Pt required assistance to advance LEs to edge of bed and to elevate trunk into sitting edge of bed.   Transfers Overall transfer level: Needs assistance Equipment used: (sara stedy) Transfers: Sit to/from Stand Sit to Stand: Mod assist;+2 physical assistance Stand pivot transfers: Mod assist        General transfer comment: Mod assistance +2 to boost into standing.  no buckling noted and able to stand for 4-5 min before sitting to take a rest break.      Balance Overall balance assessment: Needs assistance Sitting-balance support: Feet supported;Bilateral upper extremity supported Sitting balance-Leahy Scale: Poor Sitting balance - Comments: requires cues to maintain upright posture, tends to progress to forward flexion with fatigue   Standing balance support: Bilateral upper extremity supported Standing balance-Leahy Scale: Poor Standing balance comment: reliant on B UE support                            ADL either performed or assessed with clinical judgement   ADL Overall ADL's : Needs assistance/impaired Eating/Feeding: Set up;Sitting Eating/Feeding Details (indicate cue type and reason): Daughter reports pt has required max A for self feeding lately.   Provided pt with foam built up handles.  She was able to simulate self feeding with set up.                                            Vision       Perception     Praxis      Cognition Arousal/Alertness: Awake/alert Behavior During Therapy: Flat affect Overall Cognitive Status: Impaired/Different from baseline Area of Impairment: Attention;Problem solving                 Orientation Level: Disoriented to;Time  Problem Solving: Slow processing;Decreased initiation;Requires verbal cues General Comments: pt overall oriented, disoriented to month only; slower processing noted; daughter often answering for pt         Exercises Exercises: Other exercises;General Upper Extremity General Exercises - Upper Extremity Shoulder Flexion: AROM;AAROM;Left;Right;10 reps;Seated Elbow Flexion: AROM;Right;Left;10 reps;Seated Elbow Extension: AROM;Right;Left;10 reps;Seated General Exercises - Lower Extremity Ankle Circles/Pumps: AROM;Both;10 reps Quad Sets: AROM;Both;10  reps;Supine Heel Slides: AAROM;Both;10 reps;Supine Hip ABduction/ADduction: AROM;AAROM;Both;10 reps;Supine Other Exercises Other Exercises: Pt instructed in grip and pinch exercises with red foam    Shoulder Instructions       General Comments daughter present     Pertinent Vitals/ Pain       Pain Assessment: Faces Faces Pain Scale: Hurts little more Pain Location: stomach; back Pain Descriptors / Indicators: Grimacing;Guarding;Sore Pain Intervention(s): Monitored during session  Home Living                                          Prior Functioning/Environment              Frequency  Min 2X/week        Progress Toward Goals  OT Goals(current goals can now be found in the care plan section)  Progress towards OT goals: Progressing toward goals  Acute Rehab OT Goals Patient Stated Goal: Hopes to be able to go home  Plan Discharge plan remains appropriate    Co-evaluation                 AM-PAC PT "6 Clicks" Daily Activity     Outcome Measure   Help from another person eating meals?: A Little Help from another person taking care of personal grooming?: A Little Help from another person toileting, which includes using toliet, bedpan, or urinal?: A Lot Help from another person bathing (including washing, rinsing, drying)?: A Lot Help from another person to put on and taking off regular upper body clothing?: A Lot Help from another person to put on and taking off regular lower body clothing?: A Lot 6 Click Score: 14    End of Session    OT Visit Diagnosis: Muscle weakness (generalized) (M62.81)   Activity Tolerance Patient tolerated treatment well   Patient Left in chair;with call bell/phone within reach;with family/visitor present   Nurse Communication Mobility status        Time: 9937-1696 OT Time Calculation (min): 19 min  Charges: OT General Charges $OT Visit: 1 Visit OT Treatments $Therapeutic Activity: 8-22  mins  Lucille Passy, OTR/L Acute Rehabilitation Services Pager [phone] Office [phone]    Lucille Passy M 06/20/2018, 6:44 PM

## 2018-06-21 ENCOUNTER — Telehealth: Payer: Self-pay | Admitting: *Deleted

## 2018-06-21 DIAGNOSIS — R5381 Other malaise: Secondary | ICD-10-CM

## 2018-06-21 DIAGNOSIS — R601 Generalized edema: Secondary | ICD-10-CM

## 2018-06-21 LAB — CBC
HEMATOCRIT: 29.4 % — AB (ref 36.0–46.0)
HEMOGLOBIN: 9.3 g/dL — AB (ref 12.0–15.0)
MCH: 29.7 pg (ref 26.0–34.0)
MCHC: 31.6 g/dL (ref 30.0–36.0)
MCV: 93.9 fL (ref 80.0–100.0)
Platelets: 237 10*3/uL (ref 150–400)
RBC: 3.13 MIL/uL — ABNORMAL LOW (ref 3.87–5.11)
RDW: 19.8 % — ABNORMAL HIGH (ref 11.5–15.5)
WBC: 14 10*3/uL — AB (ref 4.0–10.5)
nRBC: 0.2 % (ref 0.0–0.2)

## 2018-06-21 LAB — BASIC METABOLIC PANEL
Anion gap: 10 (ref 5–15)
BUN: 89 mg/dL — ABNORMAL HIGH (ref 8–23)
CHLORIDE: 102 mmol/L (ref 98–111)
CO2: 26 mmol/L (ref 22–32)
CREATININE: 2.15 mg/dL — AB (ref 0.44–1.00)
Calcium: 8.2 mg/dL — ABNORMAL LOW (ref 8.9–10.3)
GFR calc non Af Amer: 23 mL/min — ABNORMAL LOW (ref >60)
GFR, EST AFRICAN AMERICAN: 27 mL/min — AB (ref >60)
Glucose, Bld: 101 mg/dL — ABNORMAL HIGH (ref 70–99)
Potassium: 3.6 mmol/L (ref 3.5–5.1)
Sodium: 138 mmol/L (ref 135–145)

## 2018-06-21 LAB — GLUCOSE, CAPILLARY
GLUCOSE-CAPILLARY: 114 mg/dL — AB (ref 70–99)
GLUCOSE-CAPILLARY: 174 mg/dL — AB (ref 70–99)
GLUCOSE-CAPILLARY: 98 mg/dL (ref 70–99)
Glucose-Capillary: 100 mg/dL — ABNORMAL HIGH (ref 70–99)
Glucose-Capillary: 154 mg/dL — ABNORMAL HIGH (ref 70–99)
Glucose-Capillary: 152 mg/dL — ABNORMAL HIGH (ref 70–99)

## 2018-06-21 LAB — MAGNESIUM: Magnesium: 1.5 mg/dL — ABNORMAL LOW (ref 1.7–2.4)

## 2018-06-21 MED ORDER — POTASSIUM CHLORIDE CRYS ER 20 MEQ PO TBCR
40.0000 meq | EXTENDED_RELEASE_TABLET | Freq: Two times a day (BID) | ORAL | Status: AC
  Administered 2018-06-21 (×2): 40 meq via ORAL
  Filled 2018-06-21 (×2): qty 2

## 2018-06-21 MED ORDER — FUROSEMIDE 10 MG/ML IJ SOLN
40.0000 mg | Freq: Two times a day (BID) | INTRAMUSCULAR | Status: DC
  Administered 2018-06-21 – 2018-06-22 (×3): 40 mg via INTRAVENOUS
  Filled 2018-06-21 (×4): qty 4

## 2018-06-21 MED ORDER — MAGNESIUM SULFATE 2 GM/50ML IV SOLN
2.0000 g | Freq: Once | INTRAVENOUS | Status: AC
  Administered 2018-06-21: 2 g via INTRAVENOUS
  Filled 2018-06-21: qty 50

## 2018-06-21 MED ORDER — HYDROCODONE-ACETAMINOPHEN 5-325 MG PO TABS
1.0000 | ORAL_TABLET | Freq: Four times a day (QID) | ORAL | Status: DC | PRN
  Administered 2018-06-21: 1 via ORAL
  Filled 2018-06-21: qty 1

## 2018-06-21 MED ORDER — PRO-STAT SUGAR FREE PO LIQD
30.0000 mL | Freq: Three times a day (TID) | ORAL | Status: DC
  Administered 2018-06-21 – 2018-06-26 (×15): 30 mL via ORAL
  Filled 2018-06-21 (×15): qty 30

## 2018-06-21 MED ORDER — HYDROCODONE-ACETAMINOPHEN 5-325 MG PO TABS
2.0000 | ORAL_TABLET | Freq: Three times a day (TID) | ORAL | Status: DC | PRN
  Administered 2018-06-21 – 2018-06-22 (×2): 2 via ORAL
  Filled 2018-06-21 (×2): qty 2

## 2018-06-21 NOTE — Progress Notes (Signed)
PROGRESS NOTE    [name]  BDZ:[id number] DOB: March 11, 1951 DOA: 05/28/2018 PCP: Bernerd Limbo, MD    Brief Narrative:  67 year old woman PMH gallstone pancreatitis August 2019, status post laparoscopic cholecystectomy same hospitalization, presented with abdominal pain, nausea, vomiting.  Admitted for recurrent pancreatitis.  She was seen by gastroenterology, cortrak tube was placed and she was started on enteral feeds.  Continuation has been very slow to improve and was complicated by a mild ileus, acute kidney injury and anasarca. Patient subsequently develop hematemesis, GI bleed, acute blood loss anemia in setting of esophageal ulcers related to tube feeding. patient was transfer to ICU, her Hb drop to 5, and was hypotensive. She was subsequently transfer to step down unit. She has received a total of 5 units PRBC post GI bleed. She is very edematous. She was restarted on oral lasix by nephrology.   Family on 10-15 was concern with patient hands weakness, confusion and tremors, blank staring episodes. MRI was negative for stroke, EEG negative for seizure. Patient is today alert and conversant, generalized weak.  Patient report persistence right side abdominal pain, not better. Will defer to GI further imaging. Patient will need voiding trial.    Subjective:  Patient in bed, appears comfortable, denies any headache, no fever, no chest pain or pressure, no shortness of breath , no abdominal pain. No focal weakness.  Does have some chronic generalized aches and pains all over.   Assessment & Plan:    Acute upper GI bleed, Acute Blood loss anemia; esophageal ulcer likely due to enteral feeding tube, feeding tube removed, on twice daily PPI, has received multiple units of packed RBCs with stable H&H.  Continue to monitor closely.  HTN; Continue with carvedilol, imdur low dose.   Urine retention; Place foley catheter, on Flomax.  Acute gallstone pancreatitis, now necrotizing with pseudocyst,  pancreatitis recurrent. Developing pseudocyst.  Seen by GI underwent cholecystectomy on 04/19/2018, now on oral diet and supportive care.  Abdomen nontender and benign.  Has chronic aches and pains. Had  tube feeding, below ligament of treitz. Tube feeding removed today, it cause esophageal ulceration.     Severe protein caloric malnutrition; added pro-stat.   AKI on CKD stage IV with anasarca; added protein supplementation, increase diuresis with IV Lasix and monitor.    Mild upper extremity cellulitis.  On doxy stable.    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  Colonoscopy planned October 28.  Was not iron deficient August.  Chronic A fib [place] vas 2 score of at least 3; stable. Continue with carvedilol for rate control, not on anticoagulation due to recent GI bleed.   COPD, chronic hypoxic respiratory failure on oxygen at home -- Remains stable.  Mild non-ACS pattern troponin rise, due to demand ischemia.  PMH CAD, CABG. -Cardiology recommended medical management, consider outpatient stress test.  Tremors, disorientation, Blanks spells. Per family report. MRI and EEG negative.   Recent endovascular repair of AAA along with common iliac artery stent placement.  Previous MD discussed with Dr. Donzetta Matters currently on aspirin and stable.  Cleared by GI.  Severe deconditioning.  PT, may require placement.     DVT prophylaxis: SCD Code Status: full code.  Family Communication: care discussed with multiples family. Daughter would like to aplliative care team to follow on patient.  Disposition Plan: monitor hb on aspirin. Still with dark stool. Still with rectal tube.  Consultants:  Sx GI Nephrology   Procedures:  Cortrak feeding tube placement  on 9/23 Transfusion 1 unit PRBC  Echo; Normal LVF with mild BSH, mild MR and TR, mild LVE, grade 3 diastolic dysfunction.. S/P mitral valve repair with moderate MS. No significant change from prior echo The right  ventricular systolic pressure was increased consistent with mild pulmonaryhypertension.    Objective: Vitals:   06/20/18 2342 06/21/18 0414 06/21/18 0739 06/21/18 0848  BP: (!) 165/47     Pulse: 60     Resp: 11     Temp: 99 F (37.2 C) 98.5 F (36.9 C) 97.9 F (36.6 C)   TempSrc: Oral Oral Oral   SpO2:    99%  Weight:      Height:        Intake/Output Summary (Last 24 hours) at 06/21/2018 1041 Last data filed at 06/21/2018 0230 Gross per 24 hour  Intake 1125 ml  Output 1250 ml  Net -125 ml   Filed Weights   06/16/18 0311 06/18/18 0411 06/19/18 0600  Weight: 68.4 kg 73.1 kg 74.1 kg    Examination:  Awake Alert, Oriented X 3, No new F.N deficits, Normal affect [place].AT,PERRAL Supple Neck,No JVD, No cervical lymphadenopathy appriciated.  Symmetrical Chest wall movement, Good air movement bilaterally, CTAB RRR,No Gallops, Rubs or new Murmurs, No Parasternal Heave +ve B.Sounds, Abd Soft, No tenderness, No organomegaly appriciated, No rebound - guarding or rigidity. No Cyanosis, Clubbing, 3+ edema, No new Rash or bruise    Data Reviewed: I have personally reviewed following labs and imaging studies  CBC: Recent Labs  Lab 06/17/18 1159 06/18/18 0348 06/19/18 0500 06/19/18 2056 06/20/18 0844 06/21/18 0750  WBC 18.1* 16.3* 14.1*  --  15.3* 14.0*  HGB 7.4* 7.3* 6.8* 8.7* 9.8* 9.3*  HCT 23.4* 23.3* 21.8* 27.8* 31.3* 29.4*  MCV 98.3 100.4* 101.9*  --  95.4 93.9  PLT 231 231 226  --  220 834   Basic Metabolic Panel: Recent Labs  Lab 06/17/18 0611 06/18/18 0348 06/19/18 0500 06/20/18 0844 06/21/18 0750  NA 142 137 137 138 138  K 5.0 4.2 3.9 3.4* 3.6  CL 105 104 103 102 102  CO2 25 25 25 24 26   GLUCOSE 150* 119* 99 110* 101*  BUN 133* 121* 109* 100* 89*  CREATININE 2.30* 2.17* 2.21* 2.21* 2.15*  CALCIUM 8.6* 8.5* 8.2* 8.3* 8.2*  MG  --   --   --   --  1.5*   GFR: Estimated Creatinine Clearance: 26.3 mL/min (A) (by C-G formula based on SCr of 2.15 mg/dL  (H)). Liver Function Tests: No results for input(s): AST, ALT, ALKPHOS, BILITOT, PROT, ALBUMIN in the last 168 hours. No results for input(s): LIPASE, AMYLASE in the last 168 hours. No results for input(s): AMMONIA in the last 168 hours. Coagulation Profile: Recent Labs  Lab 06/15/18 0837  INR 1.28   Cardiac Enzymes: No results for input(s): CKTOTAL, CKMB, CKMBINDEX, TROPONINI in the last 168 hours. BNP (last 3 results) No results for input(s): PROBNP in the last 8760 hours. HbA1C: No results for input(s): HGBA1C in the last 72 hours. CBG: Recent Labs  Lab 06/20/18 1700 06/20/18 2003 06/21/18 0026 06/21/18 0412 06/21/18 0737  GLUCAP 123* 102* 174* 100* 98   Lipid Profile: No results for input(s): CHOL, HDL, LDLCALC, TRIG, CHOLHDL, LDLDIRECT in the last 72 hours. Thyroid Function Tests: No results for input(s): TSH, T4TOTAL, FREET4, T3FREE, THYROIDAB in the last 72 hours. Anemia Panel: No results for input(s): VITAMINB12, FOLATE, FERRITIN, TIBC, IRON, RETICCTPCT in the last 72 hours. Sepsis Labs: No  results for input(s): PROCALCITON, LATICACIDVEN in the last 168 hours.  No results found for this or any previous visit (from the past 240 hour(s)).    Radiology Studies:  Mr Brain Wo Contrast  Result Date: 06/20/2018 CLINICAL DATA:  Confusion EXAM: MRI HEAD WITHOUT CONTRAST TECHNIQUE: Multiplanar, multiecho pulse sequences of the brain and surrounding structures were obtained without intravenous contrast. COMPARISON:  Head CT 04/09/2018.  Brain MRI 08/31/2006 FINDINGS: Brain: No acute infarction, hemorrhage, hydrocephalus, extra-axial collection or mass lesion. Mild patchy FLAIR hyperintensity in the cerebral white matter, usually from old microvascular insults. There are remote micro hemorrhages in the right cerebral hemisphere roughly aligned along the watershed. No associated gliosis is seen. Patient has a history of endarterectomy, but this was left-sided. Unilaterally and  lack of significant white matter disease argues against amyloid angiopathy. Cerebral volume loss since 2007, but mild for age. Vascular: Major flow voids are preserved Skull and upper cervical spine: No evidence of marrow lesion Sinuses/Orbits: Right cataract resection IMPRESSION: No acute or reversible finding. No specific explanation for confusion. Electronically Signed   By: Monte Fantasia M.D.   On: 06/20/2018 09:29    Scheduled Meds: . ALPRAZolam  0.5 mg Oral QHS  . aspirin EC  81 mg Oral Daily  . carvedilol  12.5 mg Oral BID WC  . cholecalciferol  5,000 Units Oral Daily  . darbepoetin (ARANESP) injection - NON-DIALYSIS  60 mcg Subcutaneous Q Mon-1800  . DULoxetine  60 mg Oral Daily  . feeding supplement  1 Container Oral TID BM  . feeding supplement (PRO-STAT SUGAR FREE 64)  30 mL Oral TID WC  . furosemide  40 mg Intravenous BID  . gabapentin  200 mg Oral BID  . insulin aspart  0-9 Units Subcutaneous Q4H  . isosorbide mononitrate  15 mg Oral Daily  . lipase/protease/amylase  12,000 Units Oral TID AC  . mouth rinse  15 mL Mouth Rinse BID  . mirtazapine  7.5 mg Oral QHS  . mometasone-formoterol  2 puff Inhalation BID  . nicotine  14 mg Transdermal Daily  . pantoprazole sodium  40 mg Oral BID  . potassium chloride  40 mEq Oral BID  . sucralfate  1 g Oral TID WC & HS  . tamsulosin  0.4 mg Oral Daily  . vitamin B-12  100 mcg Oral Daily   Continuous Infusions: . sodium chloride 250 mL (06/16/18 0821)  . magnesium sulfate 1 - 4 g bolus IVPB     Anti-infectives (From admission, onward)   Start     Dose/Rate Route Frequency Ordered Stop   06/14/18 1030  doxycycline (VIBRAMYCIN) 50 MG/5ML syrup 100 mg     100 mg Per Tube Every 12 hours 06/14/18 1028 06/16/18 2159   06/05/18 1445  meropenem (MERREM) 1 g in sodium chloride 0.9 % 100 mL IVPB     1 g 200 mL/hr over 30 Minutes Intravenous Every 12 hours 06/05/18 1433 06/10/18 1019   05/29/18 0600  piperacillin-tazobactam (ZOSYN) IVPB  3.375 g     3.375 g 12.5 mL/hr over 240 Minutes Intravenous Every 8 hours 05/28/18 2227 06/05/18 1600   05/28/18 2230  piperacillin-tazobactam (ZOSYN) IVPB 3.375 g     3.375 g 100 mL/hr over 30 Minutes Intravenous  Once 05/28/18 2227 05/29/18 0011       LOS: 24 days    Time spent: 35 minutes.   Signature  Lala Lund M.D on 06/21/2018 at 10:41 AM   -  To page go to  [email or web address] - password [facility]

## 2018-06-21 NOTE — Progress Notes (Signed)
Progress Note   Subjective  Pt feels "ok" Pain is better controlled with 2 vicodin and "manageable" when medication is on board, 1 tablet was not controlling pain, pain rated at 5/10 now No n/v or fevers.   Tolerated diet, asks to advance beyond dysphagia 2 (all food is now chopped) Rectal tube in, reports "butt is sore"  Has been out of bed and in chair at time of encounter, states she stood several times today including to brush her teeth.     Objective   Vital signs in last 24 hours: Temp:  [97.5 F (36.4 C)-99 F (37.2 C)] 97.8 F (36.6 C) (10/16 1443) Pulse Rate:  [60-65] 65 (10/16 1443) Resp:  [11-23] 20 (10/16 1443) BP: (154-173)/(47-61) 168/48 (10/16 1443) SpO2:  [99 %-100 %] 100 % (10/16 1443) Last BM Date: (rectal pouch) Gen: awake, alert, NAD HEENT: anicteric, op clear CV: RRR, no mrg Pulm: course b/l Abd: soft, TTP diffuse mid and upper abd without rebound or guarding,  +BS throughout Ext: no c/c, 1-2+ b/l edema upper and lower extremities  Neuro: nonfocal  Intake/Output from previous day: 10/15 0701 - 10/16 0700 In: 1125 [I.V.:1125] Out: 1250 [Urine:1250] Intake/Output this shift: No intake/output data recorded.  Lab Results: Recent Labs    06/19/18 0500 06/19/18 2056 06/20/18 0844 06/21/18 0750  WBC 14.1*  --  15.3* 14.0*  HGB 6.8* 8.7* 9.8* 9.3*  HCT 21.8* 27.8* 31.3* 29.4*  PLT 226  --  220 237   BMET Recent Labs    06/19/18 0500 06/20/18 0844 06/21/18 0750  NA 137 138 138  K 3.9 3.4* 3.6  CL 103 102 102  CO2 25 24 26   GLUCOSE 99 110* 101*  BUN 109* 100* 89*  CREATININE 2.21* 2.21* 2.15*  CALCIUM 8.2* 8.3* 8.2*   Studies/Results: Mr Brain Wo Contrast  Result Date: 06/20/2018 CLINICAL DATA:  Confusion EXAM: MRI HEAD WITHOUT CONTRAST TECHNIQUE: Multiplanar, multiecho pulse sequences of the brain and surrounding structures were obtained without intravenous contrast. COMPARISON:  Head CT 04/09/2018.  Brain MRI 08/31/2006 FINDINGS:  Brain: No acute infarction, hemorrhage, hydrocephalus, extra-axial collection or mass lesion. Mild patchy FLAIR hyperintensity in the cerebral white matter, usually from old microvascular insults. There are remote micro hemorrhages in the right cerebral hemisphere roughly aligned along the watershed. No associated gliosis is seen. Patient has a history of endarterectomy, but this was left-sided. Unilaterally and lack of significant white matter disease argues against amyloid angiopathy. Cerebral volume loss since 2007, but mild for age. Vascular: Major flow voids are preserved Skull and upper cervical spine: No evidence of marrow lesion Sinuses/Orbits: Right cataract resection IMPRESSION: No acute or reversible finding. No specific explanation for confusion. Electronically Signed   By: Monte Fantasia M.D.   On: 06/20/2018 09:29       Assessment / Plan:   67 year old female with complicated necrotizing pancreatitis felt to be biliary status post laparoscopic cholecystectomy on 04/19/2018, severe protein calorie anasarca, recent upper GI bleeding due to esophageal ulcer patient felt to be complication from feeding tube   1.  Pancreatitis with pseudocyst --slow improvement.  Pain managed by oral pain medication.  Tolerating oral diet.  Will need eventual repeat imaging possible pseudocyst management.  Patient will follow-up with Dr. Rush Landmark  2.  Esophageal ulcer/GI bleed with anemia --esophageal ulceration secondary to feeding tube.  Interval feeding tube has been removed and she is tolerating oral diet.  Hemoglobin now stable over the last 48 hours.  Continue twice  daily PPI.  3.  Malnutrition --tolerating oral diet currently.  Will advance to soft foods, receiving protein supplementation  4.  Severe deconditioning --working with PT and OT.  Will need rehabilitation  GI will continue to follow peripherally, please call with questions  Principal Problem:   Pancreatitis, recurrent Active  Problems:   TOBACCO ABUSE   Essential hypertension   Atrial fibrillation (HCC)   COPD (chronic obstructive pulmonary disease) (HCC)   S/P CABG x 3   Anemia   GERD (gastroesophageal reflux disease)   Hyperlipidemia   CAD (coronary artery disease)   Chronic kidney disease (CKD), stage IV (severe) (HCC)   Chronic diastolic congestive heart failure (HCC)   Anemia due to GI blood loss   Type II diabetes mellitus with renal manifestations (HCC)   SIRS (systemic inflammatory response syndrome) (HCC)   Protein-calorie malnutrition, severe   Anasarca   Demand ischemia (HCC)   Hyperkalemia   AKI (acute kidney injury) ([place])   Goals of care, counseling/discussion   Palliative care by specialist   Hematemesis   Pressure injury of skin   Esophageal ulcer with bleeding     LOS: 24 days   Jerene Bears  06/21/2018, 3:39 PM

## 2018-06-21 NOTE — Progress Notes (Signed)
Physical Therapy Treatment Patient Details Name: [name] MRN: [id number] DOB: 1950/10/13 Today's Date: 06/21/2018    History of Present Illness Pt is a 67 y/o female with c/o of abdominal pain. Note recent hospitalization 04/09/18 to 04/21/18 with GI bleed, gallstone pancreatitis, and during which she received laparoscopic cholecystectomy. Diagnosed with recurrent pancreatitis. PMH AAA, CHF, chronic back pain, CKD, COPD, GI bleed, HTN, MI, A-fib, DM, cardiac cath, CABG, maze procedure, mitral valve repair, R femoral-popliteal bypass.  Pt's course was complicated by hypotension and vomiting blood.  She had a significant drop in Hgb into the 5s.  She required several units of PRBCs and was transferred to the ICU.  She underwent an EGD on 06/16/18 which revealed and 8 mm esophageal ulcer.      PT Comments    Pt performed transfer activities with progression to pre gait activities.  Pt performing increased activity this am and able to performed ADLs at sink.  Plan next session for progression of gait training.  B LE weakness remains but no true buckling observed.      Follow Up Recommendations  CIR     Equipment Recommendations  None recommended by PT    Recommendations for Other Services Rehab consult     Precautions / Restrictions Precautions Precautions: Fall;Other (comment) Precaution Comments: watch SPO2/HR Restrictions Weight Bearing Restrictions: No    Mobility  Bed Mobility Overal bed mobility: Needs Assistance Bed Mobility: Supine to Sit     Supine to sit: Mod assist;+2 for physical assistance     General bed mobility comments: Pt able to initiate movement of LEs to edge of bed.  When fatigued patient required assistance to complete LE advancement and elevating trunk into seated position.  Used bed pad to scoot forward.    Transfers Overall transfer level: Needs assistance   Transfers: Sit to/from Stand Sit to Stand: Mod assist;+2 physical assistance          General transfer comment: Used bed pad to boost into standing from standard seat height.  Pt required assistance to elevate hip into standing.  Cues for upper trunk control and hand placement on sara stedy cross bar.  Pt performed additional trials from elevated surface height of stedy plates and she required min +2 assistance.    Ambulation/Gait Ambulation/Gait assistance: (NT but performed pre gait weight shifting and marching in standing.  )               Stairs             Wheelchair Mobility    Modified Rankin (Stroke Patients Only)       Balance Overall balance assessment: Needs assistance   Sitting balance-Leahy Scale: Poor       Standing balance-Leahy Scale: Poor Standing balance comment: reliant on B UE support                             Cognition Arousal/Alertness: Awake/alert Behavior During Therapy: Flat affect Overall Cognitive Status: Impaired/Different from baseline Area of Impairment: Problem solving;Following commands                       Following Commands: Follows one step commands with increased time     Problem Solving: Slow processing;Decreased initiation;Requires verbal cues        Exercises      General Comments        Pertinent Vitals/Pain Pain Assessment: 0-10 Pain Score:  9  Pain Location: stomach, R thigh and L arm.   Pain Descriptors / Indicators: Grimacing;Guarding;Sore Pain Intervention(s): Monitored during session;Repositioned    Home Living                      Prior Function            PT Goals (current goals can now be found in the care plan section) Acute Rehab PT Goals Patient Stated Goal: Hopes to be able to go home Potential to Achieve Goals: Good Progress towards PT goals: Progressing toward goals    Frequency    Min 3X/week      PT Plan Current plan remains appropriate    Co-evaluation PT/OT/SLP Co-Evaluation/Treatment: Yes Reason for Co-Treatment:  Complexity of the patient's impairments (multi-system involvement);Necessary to address cognition/behavior during functional activity;For patient/therapist safety PT goals addressed during session: Mobility/safety with mobility OT goals addressed during session: ADL's and self-care      AM-PAC PT "6 Clicks" Daily Activity  Outcome Measure  Difficulty turning over in bed (including adjusting bedclothes, sheets and blankets)?: Unable Difficulty moving from lying on back to sitting on the side of the bed? : Unable Difficulty sitting down on and standing up from a chair with arms (e.g., wheelchair, bedside commode, etc,.)?: Unable Help needed moving to and from a bed to chair (including a wheelchair)?: A Lot Help needed walking in hospital room?: A Lot Help needed climbing 3-5 steps with a railing? : Total 6 Click Score: 8    End of Session Equipment Utilized During Treatment: Gait belt Activity Tolerance: Patient limited by pain;Patient limited by fatigue;Patient tolerated treatment well Patient left: in chair;with call bell/phone within reach;with family/visitor present Nurse Communication: Mobility status PT Visit Diagnosis: Muscle weakness (generalized) (M62.81) Pain - Right/Left: (abdomen) Pain - part of body: (abdomen)     Time: 1028-1100 PT Time Calculation (min) (ACUTE ONLY): 32 min  Charges:  $Therapeutic Activity: 8-22 mins                     Governor Rooks, PTA Acute Rehabilitation Services Pager [phone] Office [phone]     Horatio Bertz Eli Hose 06/21/2018, 11:26 AM

## 2018-06-21 NOTE — Telephone Encounter (Signed)
Pt's daughter called and said patient has been in the hospital for 4weeks and needs to cancel her procedure at the hospital

## 2018-06-21 NOTE — Telephone Encounter (Signed)
Patient had OV with you on 04/28/18. She is scheduled for colonoscopy at [facility] with Dr.Gupta on 07/03/18. She no showed her pre-visit appointment and I see she is currently in the hospital and had EGD with Dr.Beavers. Should patient still have the colon as scheduled? Please advise. Thank you, Robbin pv

## 2018-06-21 NOTE — Telephone Encounter (Signed)
No, too ill . Thanks

## 2018-06-21 NOTE — Telephone Encounter (Signed)
Called patient today,need to reschedule the PV before colonoscopy. No answer and "mail box is full", unable to leave message.

## 2018-06-21 NOTE — Progress Notes (Signed)
Occupational Therapy Treatment Patient Details Name: CONTINA STRAIN MRN: [id number] DOB: 14-Sep-1950 Today's Date: 06/21/2018    History of present illness Pt is a 67 y/o female with c/o of abdominal pain. Note recent hospitalization 04/09/18 to 04/21/18 with GI bleed, gallstone pancreatitis, and during which she received laparoscopic cholecystectomy. Diagnosed with recurrent pancreatitis. PMH AAA, CHF, chronic back pain, CKD, COPD, GI bleed, HTN, MI, A-fib, DM, cardiac cath, CABG, maze procedure, mitral valve repair, R femoral-popliteal bypass.  Pt's course was complicated by hypotension and vomiting blood.  She had a significant drop in Hgb into the 5s.  She required several units of PRBCs and was transferred to the ICU.  She underwent an EGD on 06/16/18 which revealed and 8 mm esophageal ulcer.     OT comments   Pt participated in co-treatment with PT today with focus on bed mobility, reviewed UE ex's, transfers using steady and standing at sink in steady for grooming tasks. Pt also educated/reviewed use of cylindrical foam on handles of utensils to increase grip surface area for increased independence with self feeding. Excellent therapy session today, continues to be limited by fatigue, pain.   Follow Up Recommendations  CIR;Supervision/Assistance - 24 hour    Equipment Recommendations  None recommended by OT    Recommendations for Other Services      Precautions / Restrictions Precautions Precautions: Fall;Other (comment) Precaution Comments: watch SPO2/HR Restrictions Weight Bearing Restrictions: No       Mobility Bed Mobility Overal bed mobility: Needs Assistance Bed Mobility: Supine to Sit     Supine to sit: Mod assist;+2 for physical assistance     General bed mobility comments: Pt able to initiate movement of LEs to edge of bed.  When fatigued patient required assistance to complete LE advancement and elevating trunk into seated position.  Used bed pad to scoot forward.     Transfers Overall transfer level: Needs assistance Equipment used: Clarise Cruz Steady) Transfers: Sit to/from Stand Sit to Stand: Mod assist;+2 physical assistance Stand pivot transfers: Mod assist       General transfer comment: Used bed pad to boost into standing from standard seat height.  Pt required assistance to elevate hip into standing.  Cues for upper trunk control and hand placement on sara stedy cross bar.  Pt performed additional trials from elevated surface height of stedy plates and she required min +2 assistance.      Balance Overall balance assessment: Needs assistance Sitting-balance support: Feet supported;Bilateral upper extremity supported Sitting balance-Leahy Scale: Poor Sitting balance - Comments: requires cues to maintain upright posture, tends to progress to forward flexion with fatigue   Standing balance support: Bilateral upper extremity supported Standing balance-Leahy Scale: Poor Standing balance comment: reliant on B UE support                            ADL either performed or assessed with clinical judgement   ADL Overall ADL's : Needs assistance/impaired   Eating/Feeding Details (indicate cue type and reason): Pt was given cylindrical foam to build up handles of utensils by previous OT, verbally reviewed use with pt multiple times as she stated that she has not used them yet. Grooming: Wash/dry hands;Wash/dry face;Oral care;Brushing hair;Minimal assistance;Standing;Sitting Grooming Details (indicate cue type and reason): Standing in steady at sink (standing in Steady to wash hand/face and comb hair - rest break between activities; sitting on steady for oral care)  Lower Body Dressing: Total assistance;Bed level Lower Body Dressing Details (indicate cue type and reason): Total A to don socks at bed level this session Toilet Transfer: Moderate assistance;+2 for physical assistance;+2 for safety/equipment(Simulated from EOB with  Steady, to recliner) Toilet Transfer Details (indicate cue type and reason): Simulated from EOB with Steady, to recliner   Toileting - Clothing Manipulation Details (indicate cue type and reason): pt with foley and rectal tube       General ADL Comments: Pt seen for co-treatment with PTA today with focus on bed mobility, reviewed UE ex's, transfers using steady and standing at sink in steady for grooming tasks. Pt also educated/reviewed use of cylindrical foam on handles of .utensils to increase grip surface area for increased independence with self feeding. Excellent therapy session today.     Vision       Perception     Praxis      Cognition Arousal/Alertness: Awake/alert Behavior During Therapy: Flat affect Overall Cognitive Status: Impaired/Different from baseline Area of Impairment: Problem solving;Following commands                 Orientation Level: Disoriented to;Time     Following Commands: Follows one step commands with increased time     Problem Solving: Slow processing;Decreased initiation;Requires verbal cues          Exercises Other Exercises Other Exercises: Verbal review and performance of 1-2 reps each bilateral shoulder flexion, elbow flex/exten, grip/extension of fingers and use of foam on utensils for self feeding.   Shoulder Instructions       General Comments      Pertinent Vitals/ Pain       Pain Assessment: 0-10 Pain Score: 9  Pain Location: stomach, R thigh and L arm.   Pain Descriptors / Indicators: Grimacing;Guarding;Sore Pain Intervention(s): Monitored during session;Repositioned  Home Living                                          Prior Functioning/Environment              Frequency  Min 2X/week        Progress Toward Goals  OT Goals(current goals can now be found in the care plan section)  Progress towards OT goals: Progressing toward goals  Acute Rehab OT Goals Patient Stated Goal: Hopes  to be able to go home  Plan Discharge plan remains appropriate    Co-evaluation    PT/OT/SLP Co-Evaluation/Treatment: Yes Reason for Co-Treatment: Complexity of the patient's impairments (multi-system involvement);Necessary to address cognition/behavior during functional activity;For patient/therapist safety PT goals addressed during session: Mobility/safety with mobility OT goals addressed during session: ADL's and self-care      AM-PAC PT "6 Clicks" Daily Activity     Outcome Measure   Help from another person eating meals?: A Little Help from another person taking care of personal grooming?: A Little Help from another person toileting, which includes using toliet, bedpan, or urinal?: A Lot Help from another person bathing (including washing, rinsing, drying)?: A Lot Help from another person to put on and taking off regular upper body clothing?: A Lot Help from another person to put on and taking off regular lower body clothing?: A Lot 6 Click Score: 14    End of Session    OT Visit Diagnosis: Muscle weakness (generalized) (M62.81)   Activity Tolerance Patient limited by pain;Patient limited by fatigue;Patient  tolerated treatment well   Patient Left in chair;with call bell/phone within reach;with family/visitor present;with chair alarm set   Nurse Communication Mobility status        Time: 1030(Co-treat with PT)-1102 OT Time Calculation (min): 32 min  Charges: OT General Charges $OT Visit: 1 Visit OT Treatments $Self Care/Home Management : 8-22 mins   Lanya Bucks Beth Dixon, OTR/L 06/21/2018, 12:11 PM

## 2018-06-22 LAB — BASIC METABOLIC PANEL
ANION GAP: 8 (ref 5–15)
BUN: 81 mg/dL — ABNORMAL HIGH (ref 8–23)
CALCIUM: 8.1 mg/dL — AB (ref 8.9–10.3)
CO2: 30 mmol/L (ref 22–32)
Chloride: 100 mmol/L (ref 98–111)
Creatinine, Ser: 2.11 mg/dL — ABNORMAL HIGH (ref 0.44–1.00)
GFR, EST AFRICAN AMERICAN: 27 mL/min — AB (ref >60)
GFR, EST NON AFRICAN AMERICAN: 23 mL/min — AB (ref >60)
Glucose, Bld: 128 mg/dL — ABNORMAL HIGH (ref 70–99)
POTASSIUM: 3.6 mmol/L (ref 3.5–5.1)
Sodium: 138 mmol/L (ref 135–145)

## 2018-06-22 LAB — CBC
HEMATOCRIT: 26.4 % — AB (ref 36.0–46.0)
Hemoglobin: 8.1 g/dL — ABNORMAL LOW (ref 12.0–15.0)
MCH: 29.3 pg (ref 26.0–34.0)
MCHC: 30.7 g/dL (ref 30.0–36.0)
MCV: 95.7 fL (ref 80.0–100.0)
NRBC: 0 % (ref 0.0–0.2)
PLATELETS: 239 10*3/uL (ref 150–400)
RBC: 2.76 MIL/uL — AB (ref 3.87–5.11)
RDW: 18.9 % — AB (ref 11.5–15.5)
WBC: 11.2 10*3/uL — AB (ref 4.0–10.5)

## 2018-06-22 LAB — GLUCOSE, CAPILLARY
GLUCOSE-CAPILLARY: 124 mg/dL — AB (ref 70–99)
GLUCOSE-CAPILLARY: 125 mg/dL — AB (ref 70–99)
GLUCOSE-CAPILLARY: 130 mg/dL — AB (ref 70–99)
GLUCOSE-CAPILLARY: 189 mg/dL — AB (ref 70–99)
Glucose-Capillary: 131 mg/dL — ABNORMAL HIGH (ref 70–99)
Glucose-Capillary: 133 mg/dL — ABNORMAL HIGH (ref 70–99)

## 2018-06-22 LAB — MAGNESIUM: MAGNESIUM: 1.8 mg/dL (ref 1.7–2.4)

## 2018-06-22 MED ORDER — HYDROCODONE-ACETAMINOPHEN 5-325 MG PO TABS
2.0000 | ORAL_TABLET | Freq: Four times a day (QID) | ORAL | Status: DC | PRN
  Administered 2018-06-22 – 2018-06-26 (×13): 2 via ORAL
  Filled 2018-06-22 (×13): qty 2

## 2018-06-22 MED ORDER — MAGNESIUM SULFATE IN D5W 1-5 GM/100ML-% IV SOLN
1.0000 g | Freq: Once | INTRAVENOUS | Status: AC
  Administered 2018-06-22: 1 g via INTRAVENOUS
  Filled 2018-06-22: qty 100

## 2018-06-22 MED ORDER — POTASSIUM CHLORIDE CRYS ER 20 MEQ PO TBCR
40.0000 meq | EXTENDED_RELEASE_TABLET | Freq: Once | ORAL | Status: AC
  Administered 2018-06-22: 40 meq via ORAL
  Filled 2018-06-22: qty 2

## 2018-06-22 NOTE — Progress Notes (Signed)
Physical Therapy Treatment Patient Details Name: [name] MRN: [id number] DOB: September 21, 1950 Today's Date: 06/22/2018    History of Present Illness Pt is a 67 y/o female with c/o of abdominal pain. Note recent hospitalization 04/09/18 to 04/21/18 with GI bleed, gallstone pancreatitis, and during which she received laparoscopic cholecystectomy. Diagnosed with recurrent pancreatitis. PMH AAA, CHF, chronic back pain, CKD, COPD, GI bleed, HTN, MI, A-fib, DM, cardiac cath, CABG, maze procedure, mitral valve repair, R femoral-popliteal bypass.  Pt's course was complicated by hypotension and vomiting blood.  She had a significant drop in Hgb into the 5s.  She required several units of PRBCs and was transferred to the ICU.  She underwent an EGD on 06/16/18 which revealed and 8 mm esophageal ulcer.      PT Comments    Pt able to progress to gait training and tolerated short session from chair at her doorway back to her bed.  Pt continues to benefit from aggressive CIR therapies as she has turned a corner and is performing much better functional.  Plan next session for progression of gait training.     Follow Up Recommendations  CIR     Equipment Recommendations  None recommended by PT    Recommendations for Other Services Rehab consult     Precautions / Restrictions Precautions Precautions: Fall;Other (comment) Precaution Comments: watch SPO2/HR Restrictions Weight Bearing Restrictions: No    Mobility  Bed Mobility Overal bed mobility: Needs Assistance Bed Mobility: Sit to Supine       Sit to supine: Mod assist;+2 for physical assistance   General bed mobility comments: Pt required assistance to lower trunk and lift B LEs back to bed.  Pt fatigued after gait training.    Transfers Overall transfer level: Needs assistance Equipment used: Rolling walker (2 wheeled) Transfers: Sit to/from Stand Sit to Stand: Mod assist;+2 physical assistance         General transfer comment:  Cues for hand placement to push from seated surface.  Pt slow and required boost into standing and assistance to facilitate hip extension.    Ambulation/Gait Ambulation/Gait assistance: Mod assist Gait Distance (Feet): 10 Feet Assistive device: Rolling walker (2 wheeled) Gait Pattern/deviations: Step-through pattern;Decreased stride length;Trunk flexed;Shuffle Gait velocity: slowed   General Gait Details: Cues for upper trunk control and RW safety, assistance to manage RW with turns and backing.  Close chair follow for safety.     Stairs             Wheelchair Mobility    Modified Rankin (Stroke Patients Only)       Balance Overall balance assessment: Needs assistance Sitting-balance support: Feet supported;Bilateral upper extremity supported Sitting balance-Leahy Scale: Poor       Standing balance-Leahy Scale: Poor Standing balance comment: reliant on B UE support                             Cognition Arousal/Alertness: Awake/alert Behavior During Therapy: Flat affect;WFL for tasks assessed/performed Overall Cognitive Status: Within Functional Limits for tasks assessed                                 General Comments: not formally tested but appears within normal limits.        Exercises      General Comments        Pertinent Vitals/Pain Pain Assessment: 0-10 Pain Score: 9  Pain Location: bottom Pain Descriptors / Indicators: Sore(from sitting in recliner chair, patient shifting into partial sidelying on arrival.  ) Pain Intervention(s): Monitored during session;Repositioned    Home Living                      Prior Function            PT Goals (current goals can now be found in the care plan section) Acute Rehab PT Goals Patient Stated Goal: Hopes to be able to go home Potential to Achieve Goals: Good Progress towards PT goals: Progressing toward goals    Frequency    Min 3X/week      PT Plan Current  plan remains appropriate    Co-evaluation              AM-PAC PT "6 Clicks" Daily Activity  Outcome Measure  Difficulty turning over in bed (including adjusting bedclothes, sheets and blankets)?: Unable Difficulty moving from lying on back to sitting on the side of the bed? : Unable Difficulty sitting down on and standing up from a chair with arms (e.g., wheelchair, bedside commode, etc,.)?: Unable Help needed moving to and from a bed to chair (including a wheelchair)?: A Lot Help needed walking in hospital room?: A Lot Help needed climbing 3-5 steps with a railing? : Total 6 Click Score: 8    End of Session Equipment Utilized During Treatment: Gait belt Activity Tolerance: Patient limited by pain;Patient limited by fatigue;Patient tolerated treatment well Patient left: with call bell/phone within reach;with family/visitor present;in bed;with bed alarm set Nurse Communication: Mobility status PT Visit Diagnosis: Muscle weakness (generalized) (M62.81) Pain - Right/Left: (abdomen) Pain - part of body: (abdomen)     Time: 3893-7342 PT Time Calculation (min) (ACUTE ONLY): 17 min  Charges:  $Gait Training: 8-22 mins                     Governor Rooks, PTA Acute Rehabilitation Services Pager [phone] Office [phone]     Fariha Goto Eli Hose 06/22/2018, 5:08 PM

## 2018-06-22 NOTE — Telephone Encounter (Signed)
Called and spoke with Lorre Nick at [facility] - procedure canceled.  Tried to contact patient on landline and mobile.  Unable to leave a message.

## 2018-06-22 NOTE — Progress Notes (Addendum)
PROGRESS NOTE    [name]  CNO:[id number] DOB: 1950-11-25 DOA: 05/28/2018 PCP: Bernerd Limbo, MD    Brief Narrative:  67 year old woman PMH gallstone pancreatitis August 2019, status post laparoscopic cholecystectomy same hospitalization, presented with abdominal pain, nausea, vomiting.  Admitted for recurrent pancreatitis.  She was seen by gastroenterology, cortrak tube was placed and she was started on enteral feeds.  Continuation has been very slow to improve and was complicated by a mild ileus, acute kidney injury and anasarca. Patient subsequently develop hematemesis, GI bleed, acute blood loss anemia in setting of esophageal ulcers related to tube feeding. patient was transfer to ICU, her Hb drop to 5, and was hypotensive. She was subsequently transfer to step down unit. She has received a total of 5 units PRBC post GI bleed. She is very edematous. She was restarted on oral lasix by nephrology.   Family on 10-15 was concern with patient hands weakness, confusion and tremors, blank staring episodes. MRI was negative for stroke, EEG negative for seizure. Patient is today alert and conversant, generalized weak.  Patient report persistence right side abdominal pain, not better. Will defer to GI further imaging. Patient will need voiding trial.    Subjective: Patient in bed, appears comfortable, denies any headache, no fever, no chest pain or pressure, no shortness of breath , No focal weakness.  Of note patient was found resting comfortably in bed with eyes closed heart rate 58 bpm, in no distress on examination however she complains of 10 out of 10 acute on chronic pain and wants her Vicodin dose to be increased.     Assessment & Plan:    Acute upper GI bleed, Acute Blood loss anemia; esophageal ulcer likely due to enteral feeding tube, feeding tube removed, on twice daily PPI, has received multiple units of packed RBCs with stable H&H.  Continue to monitor closely.  HTN; Continue with  carvedilol, imdur low dose.   Urine retention; acute on Foley and Flomax, trial of Foley removal on 06/22/2018.  Monitor postvoid bladder scans for any retention.  Acute gallstone pancreatitis, now necrotizing with pseudocyst, pancreatitis recurrent. Developing pseudocyst.  Seen by GI underwent cholecystectomy on 04/19/2018, now on oral diet and supportive care.  Abdomen nontender and benign.  Has chronic aches and pains. Had  tube feeding, below ligament of treitz. Tube feeding removed today, it cause esophageal ulceration.     Severe protein caloric malnutrition; added pro-stat.   AKI on CKD stage V with anasarca; added protein supplementation, added IV Lasix with about 3 L of fluid removed on 06/21/2018, continue diuresis with IV Lasix at 40 twice daily, continue supplementing potassium and monitoring electrolytes, apply TED stockings to both feet and elevate both arms with pillows.  Anasarca is improved.  Note baseline creatinine is close to 2.5.  Mild upper extremity cellulitis.  On doxy stable.    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  Colonoscopy planned October 28.  Was not iron deficient August.  Chronic A fib [place] vas 2 score of at least 3; stable. Continue with carvedilol for rate control, not on anticoagulation due to recent GI bleed.   COPD, chronic hypoxic respiratory failure on oxygen at home -- Remains stable.  Mild non-ACS pattern troponin rise, due to demand ischemia.  PMH CAD, CABG. -Cardiology recommended medical management, consider outpatient stress test.  Tremors, disorientation, Blanks spells. Per family report. MRI and EEG negative.   Recent endovascular repair of AAA along with common iliac artery  stent placement.  Previous MD discussed with Dr. Donzetta Matters currently on aspirin and stable.  Cleared by GI.  Severe deconditioning.  PT, may require placement.     DVT prophylaxis: SCD Code Status: full code.  Family Communication: Daughter  updated bedside 06/22/2018 Disposition Plan: SNF in 1 to 2 days.  Consultants:  Sx GI Nephrology   Procedures:  Cortrak feeding tube placement on 9/23   Echo; Normal LVF with mild BSH, mild MR and TR, mild LVE, grade 3 diastolic dysfunction.. S/P mitral valve repair with moderate MS. No significant change from prior echo The right ventricular systolic pressure was increased consistent with mild pulmonaryhypertension.    Objective: Vitals:   06/21/18 1844 06/21/18 2031 06/21/18 2120 06/22/18 0513  BP: (!) 177/55  (!) 170/48 (!) 147/52  Pulse: 71  67 (!) 58  Resp:      Temp:   98.8 F (37.1 C) 97.9 F (36.6 C)  TempSrc:   Oral Oral  SpO2:  98% 100% 100%  Weight:      Height:        Intake/Output Summary (Last 24 hours) at 06/22/2018 0817 Last data filed at 06/22/2018 1025 Gross per 24 hour  Intake 794.17 ml  Output 2200 ml  Net -1405.83 ml   Filed Weights   06/16/18 0311 06/18/18 0411 06/19/18 0600  Weight: 68.4 kg 73.1 kg 74.1 kg    Examination:  Awake Alert, Oriented X 3, No new F.N deficits, resting comfortably eyes closed in no distress at all [place].AT,PERRAL Supple Neck,No JVD, No cervical lymphadenopathy appriciated.  Symmetrical Chest wall movement, Good air movement bilaterally, CTAB RRR,No Gallops, Rubs or new Murmurs, No Parasternal Heave +ve B.Sounds, Abd Soft, No tenderness, No organomegaly appriciated, No rebound - guarding or rigidity. No Cyanosis, Clubbing, 2+ edema, No new Rash or bruise     Data Reviewed: I have personally reviewed following labs and imaging studies  CBC: Recent Labs  Lab 06/18/18 0348 06/19/18 0500 06/19/18 2056 06/20/18 0844 06/21/18 0750 06/22/18 0330  WBC 16.3* 14.1*  --  15.3* 14.0* 11.2*  HGB 7.3* 6.8* 8.7* 9.8* 9.3* 8.1*  HCT 23.3* 21.8* 27.8* 31.3* 29.4* 26.4*  MCV 100.4* 101.9*  --  95.4 93.9 95.7  PLT 231 226  --  220 237 852   Basic Metabolic Panel: Recent Labs  Lab 06/18/18 0348 06/19/18 0500  06/20/18 0844 06/21/18 0750 06/22/18 0330  NA 137 137 138 138 138  K 4.2 3.9 3.4* 3.6 3.6  CL 104 103 102 102 100  CO2 25 25 24 26 30   GLUCOSE 119* 99 110* 101* 128*  BUN 121* 109* 100* 89* 81*  CREATININE 2.17* 2.21* 2.21* 2.15* 2.11*  CALCIUM 8.5* 8.2* 8.3* 8.2* 8.1*  MG  --   --   --  1.5* 1.8   GFR: Estimated Creatinine Clearance: 26.8 mL/min (A) (by C-G formula based on SCr of 2.11 mg/dL (H)). Liver Function Tests: No results for input(s): AST, ALT, ALKPHOS, BILITOT, PROT, ALBUMIN in the last 168 hours. No results for input(s): LIPASE, AMYLASE in the last 168 hours. No results for input(s): AMMONIA in the last 168 hours. Coagulation Profile: Recent Labs  Lab 06/15/18 0837  INR 1.28   Cardiac Enzymes: No results for input(s): CKTOTAL, CKMB, CKMBINDEX, TROPONINI in the last 168 hours. BNP (last 3 results) No results for input(s): PROBNP in the last 8760 hours. HbA1C: No results for input(s): HGBA1C in the last 72 hours. CBG: Recent Labs  Lab 06/21/18 0737 06/21/18 1146  06/21/18 1627 06/21/18 2012 06/22/18 0004  GLUCAP 98 154* 152* 114* 130*   Lipid Profile: No results for input(s): CHOL, HDL, LDLCALC, TRIG, CHOLHDL, LDLDIRECT in the last 72 hours. Thyroid Function Tests: No results for input(s): TSH, T4TOTAL, FREET4, T3FREE, THYROIDAB in the last 72 hours. Anemia Panel: No results for input(s): VITAMINB12, FOLATE, FERRITIN, TIBC, IRON, RETICCTPCT in the last 72 hours. Sepsis Labs: No results for input(s): PROCALCITON, LATICACIDVEN in the last 168 hours.  No results found for this or any previous visit (from the past 240 hour(s)).    Radiology Studies:  No results found.  Scheduled Meds: . ALPRAZolam  0.5 mg Oral QHS  . aspirin EC  81 mg Oral Daily  . carvedilol  12.5 mg Oral BID WC  . cholecalciferol  5,000 Units Oral Daily  . darbepoetin (ARANESP) injection - NON-DIALYSIS  60 mcg Subcutaneous Q Mon-1800  . DULoxetine  60 mg Oral Daily  . feeding  supplement  1 Container Oral TID BM  . feeding supplement (PRO-STAT SUGAR FREE 64)  30 mL Oral TID WC  . furosemide  40 mg Intravenous BID  . gabapentin  200 mg Oral BID  . insulin aspart  0-9 Units Subcutaneous Q4H  . isosorbide mononitrate  15 mg Oral Daily  . lipase/protease/amylase  12,000 Units Oral TID AC  . mouth rinse  15 mL Mouth Rinse BID  . mirtazapine  7.5 mg Oral QHS  . mometasone-formoterol  2 puff Inhalation BID  . nicotine  14 mg Transdermal Daily  . pantoprazole sodium  40 mg Oral BID  . potassium chloride  40 mEq Oral Once  . potassium chloride  40 mEq Oral Once  . sucralfate  1 g Oral TID WC & HS  . tamsulosin  0.4 mg Oral Daily  . vitamin B-12  100 mcg Oral Daily   Continuous Infusions: . magnesium sulfate 1 - 4 g bolus IVPB     Anti-infectives (From admission, onward)   Start     Dose/Rate Route Frequency Ordered Stop   06/14/18 1030  doxycycline (VIBRAMYCIN) 50 MG/5ML syrup 100 mg     100 mg Per Tube Every 12 hours 06/14/18 1028 06/16/18 2159   06/05/18 1445  meropenem (MERREM) 1 g in sodium chloride 0.9 % 100 mL IVPB     1 g 200 mL/hr over 30 Minutes Intravenous Every 12 hours 06/05/18 1433 06/10/18 1019   05/29/18 0600  piperacillin-tazobactam (ZOSYN) IVPB 3.375 g     3.375 g 12.5 mL/hr over 240 Minutes Intravenous Every 8 hours 05/28/18 2227 06/05/18 1600   05/28/18 2230  piperacillin-tazobactam (ZOSYN) IVPB 3.375 g     3.375 g 100 mL/hr over 30 Minutes Intravenous  Once 05/28/18 2227 05/29/18 0011       LOS: 25 days    Time spent: 35 minutes.   Signature  Lala Lund M.D on 06/22/2018 at 8:17 AM   -  To page go to [email or web address] - password [email or web address]

## 2018-06-23 LAB — HEMOGLOBIN AND HEMATOCRIT, BLOOD
HCT: 29.5 % — ABNORMAL LOW (ref 36.0–46.0)
Hemoglobin: 9.1 g/dL — ABNORMAL LOW (ref 12.0–15.0)

## 2018-06-23 LAB — BASIC METABOLIC PANEL
Anion gap: 9 (ref 5–15)
BUN: 84 mg/dL — AB (ref 8–23)
CALCIUM: 8.5 mg/dL — AB (ref 8.9–10.3)
CO2: 30 mmol/L (ref 22–32)
CREATININE: 2.03 mg/dL — AB (ref 0.44–1.00)
Chloride: 100 mmol/L (ref 98–111)
GFR calc Af Amer: 28 mL/min — ABNORMAL LOW (ref >60)
GFR, EST NON AFRICAN AMERICAN: 25 mL/min — AB (ref >60)
GLUCOSE: 108 mg/dL — AB (ref 70–99)
POTASSIUM: 4 mmol/L (ref 3.5–5.1)
SODIUM: 139 mmol/L (ref 135–145)

## 2018-06-23 LAB — GLUCOSE, CAPILLARY
GLUCOSE-CAPILLARY: 135 mg/dL — AB (ref 70–99)
GLUCOSE-CAPILLARY: 164 mg/dL — AB (ref 70–99)
Glucose-Capillary: 99 mg/dL (ref 70–99)
Glucose-Capillary: 102 mg/dL — ABNORMAL HIGH (ref 70–99)
Glucose-Capillary: 155 mg/dL — ABNORMAL HIGH (ref 70–99)
Glucose-Capillary: 121 mg/dL — ABNORMAL HIGH (ref 70–99)

## 2018-06-23 LAB — MAGNESIUM: MAGNESIUM: 1.7 mg/dL (ref 1.7–2.4)

## 2018-06-23 MED ORDER — SPIRONOLACTONE 25 MG PO TABS
25.0000 mg | ORAL_TABLET | Freq: Every day | ORAL | Status: DC
  Administered 2018-06-23 – 2018-06-26 (×4): 25 mg via ORAL
  Filled 2018-06-23 (×4): qty 1

## 2018-06-23 MED ORDER — FUROSEMIDE 10 MG/ML IJ SOLN
60.0000 mg | Freq: Two times a day (BID) | INTRAMUSCULAR | Status: DC
  Administered 2018-06-23 (×2): 60 mg via INTRAVENOUS
  Filled 2018-06-23 (×3): qty 6

## 2018-06-23 MED ORDER — MAGNESIUM SULFATE IN D5W 1-5 GM/100ML-% IV SOLN
1.0000 g | Freq: Once | INTRAVENOUS | Status: AC
  Administered 2018-06-23: 1 g via INTRAVENOUS
  Filled 2018-06-23: qty 100

## 2018-06-23 NOTE — Progress Notes (Signed)
     [facility] Progress Note   Chief Complaint:   pancreatitis   SUBJECTIVE:    says she is going to Rehab tomorrow. Tolerating solids   ASSESSMENT AND PLAN:   44. 67 yo female with pancreatitis with pseudocyst, improvement has been slow. She will need repeat imaging at some point but thus far she hasn't been imaged with contrast due to CKD. Still not entirely clear why she got pancreatitis (wasn't clear cut biliary) Tolerating solids. Sounds like she is going to Rehab tomorrow.  - follow up with Dr. Rush Landmark 07/24/18 at 2:45pm  2. RecentGI bleed / esophageal ulcer secondary to feeding tube. Hgb stable at 9 after last transfusion on 10/14 --continue BID PPI and Carafate until follow up appt  3. Chronic anemia. Unremarkable EGD on admission in Aug. She was scheduled to have outpatient colonoscopy this month but cancelled due to hospitalization.     OBJECTIVE:     Vital signs in last 24 hours: Temp:  [97.4 F (36.3 C)-98.5 F (36.9 C)] 97.4 F (36.3 C) (10/18 0414) Pulse Rate:  [60-66] 62 (10/18 0828) Resp:  [17-18] 18 (10/18 0414) BP: (149-185)/(45-52) 149/52 (10/18 0828) SpO2:  [98 %-100 %] 98 % (10/18 0810) Weight:  [72.6 kg] 72.6 kg (10/18 0414) Last BM Date: 06/21/18 General:   Alert, female in NAD EENT:  Normal hearing, non icteric sclera, conjunctive pink.  Heart:  Regular rate and rhythm; 3+ BLE edema Pulm: Normal respiratory effort Abdomen:  Soft, nondistended, mild right mid abdominal tenderness..  Normal bowel sounds     Neurologic:  Alert and  oriented x4;  grossly normal neurologically. Psych:  Pleasant, cooperative.  Normal mood and affect.   Intake/Output from previous day: 10/17 0701 - 10/18 0700 In: 120 [P.O.:120] Out: 2300 [Urine:2300] Intake/Output this shift: No intake/output data recorded.  Lab Results: Recent Labs    06/21/18 0750 06/22/18 0330 06/23/18 0956  WBC 14.0* 11.2*  --   HGB 9.3* 8.1* 9.1*  HCT 29.4* 26.4*  29.5*  PLT 237 239  --    BMET Recent Labs    06/21/18 0750 06/22/18 0330 06/23/18 0305  NA 138 138 139  K 3.6 3.6 4.0  CL 102 100 100  CO2 26 30 30   GLUCOSE 101* 128* 108*  BUN 89* 81* 84*  CREATININE 2.15* 2.11* 2.03*  CALCIUM 8.2* 8.1* 8.5*   Principal Problem:   Pancreatitis, recurrent Active Problems:   TOBACCO ABUSE   Essential hypertension   Atrial fibrillation (HCC)   COPD (chronic obstructive pulmonary disease) (HCC)   S/P CABG x 3   Anemia   GERD (gastroesophageal reflux disease)   Hyperlipidemia   CAD (coronary artery disease)   Chronic kidney disease (CKD), stage IV (severe) (HCC)   Chronic diastolic congestive heart failure (HCC)   Anemia due to GI blood loss   Type II diabetes mellitus with renal manifestations (HCC)   SIRS (systemic inflammatory response syndrome) (HCC)   Protein-calorie malnutrition, severe   Anasarca   Demand ischemia (HCC)   Hyperkalemia   AKI (acute kidney injury) ([place])   Goals of care, counseling/discussion   Palliative care by specialist   Hematemesis   Pressure injury of skin   Esophageal ulcer with bleeding   Physical deconditioning     LOS: 26 days   Tye Savoy ,NP 06/23/2018, 12:43 PM

## 2018-06-23 NOTE — Progress Notes (Signed)
PROGRESS NOTE    [name]  ZLD:[id number] DOB: 11-08-1950 DOA: 05/28/2018 PCP: Bernerd Limbo, MD    Brief Narrative:  67 year old woman PMH gallstone pancreatitis August 2019, status post laparoscopic cholecystectomy same hospitalization, presented with abdominal pain, nausea, vomiting.  Admitted for recurrent pancreatitis.  She was seen by gastroenterology, cortrak tube was placed and she was started on enteral feeds.  Continuation has been very slow to improve and was complicated by a mild ileus, acute kidney injury and anasarca. Patient subsequently develop hematemesis, GI bleed, acute blood loss anemia in setting of esophageal ulcers related to tube feeding. patient was transfer to ICU, her Hb drop to 5, and was hypotensive. She was subsequently transfer to step down unit. She has received a total of 5 units PRBC post GI bleed. She is very edematous. She was restarted on oral lasix by nephrology.   Family on 10-15 was concern with patient hands weakness, confusion and tremors, blank staring episodes. MRI was negative for stroke, EEG negative for seizure. Patient is today alert and conversant, generalized weak.  Patient report persistence right side abdominal pain, not better. Will defer to GI further imaging. Patient will need voiding trial.    Subjective: Patient in bed, appears comfortable, denies any headache, no fever, no chest pain or pressure, no shortness of breath , No focal weakness.  Of note patient was found resting comfortably in bed with eyes closed heart rate 58 bpm, in no distress on examination however she complains of 10 out of 10 acute on chronic pain and wants her Vicodin dose to be increased.     Assessment & Plan:    Acute gallstone pancreatitis, now necrotizing with pseudocyst, pancreatitis recurrent. Developing pseudocyst.  Seen by GI underwent cholecystectomy on 04/19/2018, now on oral diet and supportive care.  Abdomen nontender and benign.  Has chronic aches and  pains. Had  tube feeding, below ligament of treitz. Tube feeding removed today, it cause esophageal ulceration.  Acute upper GI bleed, Acute Blood loss anemia; esophageal ulcer likely due to enteral feeding tube, feeding tube removed, on twice daily PPI, has received multiple units of packed RBCs with stable posttransfusion H&H, repeat H&H today and again tomorrow, no signs of active ongoing bleeding at this time.  AKI on CKD stage V with anasarca; added protein supplementation, still has impressive edema increase Lasix to 60 twice daily, so far 4.5 L of fluid removed on the neck basis, continue supplementing potassium and monitoring electrolytes, apply TED stockings to both feet and elevate both arms with pillows.  Baseline creatinine is 2.4-2.5, overall edema is improving but still has some ways to go.  Urine retention; acute on Foley and Flomax, trial of Foley removal on 06/22/2018 - failed, Foley had to be replaced with outpatient urology follow-up.     Severe protein caloric malnutrition; added pro-stat.   HTN; Continue with carvedilol, imdur low dose.   Mild upper extremity cellulitis.  Treated with doxycycline resolved.  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  Colonoscopy planned October 28.  Was not iron deficient August.  Chronic A fib [place] vas 2 score of at least 3; stable. Continue with carvedilol for rate control, not on anticoagulation due to recent GI bleed.   COPD, chronic hypoxic respiratory failure on oxygen at home -- Remains stable.  Mild non-ACS pattern troponin rise, due to demand ischemia.  PMH CAD, CABG. -Cardiology recommended medical management, consider outpatient stress test.  Tremors, disorientation, Blanks spells. Per family  report. MRI and EEG negative.   Recent endovascular repair of AAA along with common iliac artery stent placement.  Previous MD discussed with Dr. Donzetta Matters currently on aspirin and stable.  Cleared by GI.  Severe  deconditioning.  PT, may require placement.     DVT prophylaxis: SCD Code Status: full code.  Family Communication: Daughter updated bedside 06/22/2018 Disposition Plan: SNF likely on 06/24/2018 to [place] Consultants:  Sx GI Nephrology   Procedures:  Cortrak feeding tube placement on 9/23   Echo; Normal LVF with mild BSH, mild MR and TR, mild LVE, grade 3 diastolic dysfunction.. S/P mitral valve repair with moderate MS. No significant change from prior echo The right ventricular systolic pressure was increased consistent with mild pulmonaryhypertension.    Objective: Vitals:   06/22/18 2017 06/23/18 0414 06/23/18 0810 06/23/18 0828  BP: (!) 185/45 (!) 154/48  (!) 149/52  Pulse: 66 60  62  Resp: 17 18    Temp: 98.5 F (36.9 C) (!) 97.4 F (36.3 C)    TempSrc: Oral     SpO2: 100% 100% 98%   Weight:  72.6 kg    Height:        Intake/Output Summary (Last 24 hours) at 06/23/2018 0918 Last data filed at 06/23/2018 0547 Gross per 24 hour  Intake 120 ml  Output 2300 ml  Net -2180 ml   Filed Weights   06/18/18 0411 06/19/18 0600 06/23/18 0414  Weight: 73.1 kg 74.1 kg 72.6 kg    Examination:  Sleeping, woken up, alert immediately and in no discomfort, oriented X 3, No new F.N deficits, [place].AT,PERRAL Supple Neck,No JVD, No cervical lymphadenopathy appriciated.  Symmetrical Chest wall movement, Good air movement bilaterally, CTAB RRR,No Gallops, Rubs or new Murmurs, No Parasternal Heave +ve B.Sounds, Abd Soft, No tenderness, No organomegaly appriciated, No rebound - guarding or rigidity. No Cyanosis, 2+ edema all over, Foley in place   Data Reviewed: I have personally reviewed following labs and imaging studies  CBC: Recent Labs  Lab 06/18/18 0348 06/19/18 0500 06/19/18 2056 06/20/18 0844 06/21/18 0750 06/22/18 0330  WBC 16.3* 14.1*  --  15.3* 14.0* 11.2*  HGB 7.3* 6.8* 8.7* 9.8* 9.3* 8.1*  HCT 23.3* 21.8* 27.8* 31.3* 29.4* 26.4*  MCV 100.4* 101.9*   --  95.4 93.9 95.7  PLT 231 226  --  220 237 174   Basic Metabolic Panel: Recent Labs  Lab 06/19/18 0500 06/20/18 0844 06/21/18 0750 06/22/18 0330 06/23/18 0305  NA 137 138 138 138 139  K 3.9 3.4* 3.6 3.6 4.0  CL 103 102 102 100 100  CO2 25 24 26 30 30   GLUCOSE 99 110* 101* 128* 108*  BUN 109* 100* 89* 81* 84*  CREATININE 2.21* 2.21* 2.15* 2.11* 2.03*  CALCIUM 8.2* 8.3* 8.2* 8.1* 8.5*  MG  --   --  1.5* 1.8 1.7   GFR: Estimated Creatinine Clearance: 27.6 mL/min (A) (by C-G formula based on SCr of 2.03 mg/dL (H)). Liver Function Tests: No results for input(s): AST, ALT, ALKPHOS, BILITOT, PROT, ALBUMIN in the last 168 hours. No results for input(s): LIPASE, AMYLASE in the last 168 hours. No results for input(s): AMMONIA in the last 168 hours. Coagulation Profile: No results for input(s): INR, PROTIME in the last 168 hours. Cardiac Enzymes: No results for input(s): CKTOTAL, CKMB, CKMBINDEX, TROPONINI in the last 168 hours. BNP (last 3 results) No results for input(s): PROBNP in the last 8760 hours. HbA1C: No results for input(s): HGBA1C in the last  72 hours. CBG: Recent Labs  Lab 06/22/18 1631 06/22/18 2010 06/23/18 0103 06/23/18 0411 06/23/18 0837  GLUCAP 133* 189* 135* 99 102*   Lipid Profile: No results for input(s): CHOL, HDL, LDLCALC, TRIG, CHOLHDL, LDLDIRECT in the last 72 hours. Thyroid Function Tests: No results for input(s): TSH, T4TOTAL, FREET4, T3FREE, THYROIDAB in the last 72 hours. Anemia Panel: No results for input(s): VITAMINB12, FOLATE, FERRITIN, TIBC, IRON, RETICCTPCT in the last 72 hours. Sepsis Labs: No results for input(s): PROCALCITON, LATICACIDVEN in the last 168 hours.  No results found for this or any previous visit (from the past 240 hour(s)).    Radiology Studies:  No results found.  Scheduled Meds: . ALPRAZolam  0.5 mg Oral QHS  . aspirin EC  81 mg Oral Daily  . carvedilol  12.5 mg Oral BID WC  . cholecalciferol  5,000 Units  Oral Daily  . darbepoetin (ARANESP) injection - NON-DIALYSIS  60 mcg Subcutaneous Q Mon-1800  . DULoxetine  60 mg Oral Daily  . feeding supplement  1 Container Oral TID BM  . feeding supplement (PRO-STAT SUGAR FREE 64)  30 mL Oral TID WC  . furosemide  60 mg Intravenous BID  . gabapentin  200 mg Oral BID  . insulin aspart  0-9 Units Subcutaneous Q4H  . isosorbide mononitrate  15 mg Oral Daily  . lipase/protease/amylase  12,000 Units Oral TID AC  . mouth rinse  15 mL Mouth Rinse BID  . mirtazapine  7.5 mg Oral QHS  . mometasone-formoterol  2 puff Inhalation BID  . nicotine  14 mg Transdermal Daily  . pantoprazole sodium  40 mg Oral BID  . spironolactone  25 mg Oral Daily  . sucralfate  1 g Oral TID WC & HS  . tamsulosin  0.4 mg Oral Daily  . vitamin B-12  100 mcg Oral Daily   Continuous Infusions: . magnesium sulfate 1 - 4 g bolus IVPB     Anti-infectives (From admission, onward)   Start     Dose/Rate Route Frequency Ordered Stop   06/14/18 1030  doxycycline (VIBRAMYCIN) 50 MG/5ML syrup 100 mg     100 mg Per Tube Every 12 hours 06/14/18 1028 06/16/18 2159   06/05/18 1445  meropenem (MERREM) 1 g in sodium chloride 0.9 % 100 mL IVPB     1 g 200 mL/hr over 30 Minutes Intravenous Every 12 hours 06/05/18 1433 06/10/18 1019   05/29/18 0600  piperacillin-tazobactam (ZOSYN) IVPB 3.375 g     3.375 g 12.5 mL/hr over 240 Minutes Intravenous Every 8 hours 05/28/18 2227 06/05/18 1600   05/28/18 2230  piperacillin-tazobactam (ZOSYN) IVPB 3.375 g     3.375 g 100 mL/hr over 30 Minutes Intravenous  Once 05/28/18 2227 05/29/18 0011       LOS: 26 days    Time spent: 35 minutes.   Signature  Lala Lund M.D on 06/23/2018 at 9:18 AM   -  To page go to [email or web address] - password [facility]

## 2018-06-23 NOTE — Progress Notes (Signed)
CSW confirmed with [facility]ion (SNF) patient will need re-authorization. Re-authorization started today.  Thurmond Butts, [place] Social Worker [phone]

## 2018-06-24 LAB — GLUCOSE, CAPILLARY
GLUCOSE-CAPILLARY: 101 mg/dL — AB (ref 70–99)
GLUCOSE-CAPILLARY: 116 mg/dL — AB (ref 70–99)
GLUCOSE-CAPILLARY: 162 mg/dL — AB (ref 70–99)
GLUCOSE-CAPILLARY: 49 mg/dL — AB (ref 70–99)
Glucose-Capillary: 107 mg/dL — ABNORMAL HIGH (ref 70–99)
Glucose-Capillary: 131 mg/dL — ABNORMAL HIGH (ref 70–99)
Glucose-Capillary: 178 mg/dL — ABNORMAL HIGH (ref 70–99)
Glucose-Capillary: 97 mg/dL (ref 70–99)

## 2018-06-24 LAB — BASIC METABOLIC PANEL
ANION GAP: 8 (ref 5–15)
BUN: 90 mg/dL — ABNORMAL HIGH (ref 8–23)
CALCIUM: 8.4 mg/dL — AB (ref 8.9–10.3)
CHLORIDE: 100 mmol/L (ref 98–111)
CO2: 32 mmol/L (ref 22–32)
Creatinine, Ser: 1.94 mg/dL — ABNORMAL HIGH (ref 0.44–1.00)
GFR calc non Af Amer: 26 mL/min — ABNORMAL LOW (ref >60)
GFR, EST AFRICAN AMERICAN: 30 mL/min — AB (ref >60)
GLUCOSE: 109 mg/dL — AB (ref 70–99)
POTASSIUM: 3.7 mmol/L (ref 3.5–5.1)
Sodium: 140 mmol/L (ref 135–145)

## 2018-06-24 LAB — CBC
HEMATOCRIT: 27.4 % — AB (ref 36.0–46.0)
HEMOGLOBIN: 8.3 g/dL — AB (ref 12.0–15.0)
MCH: 29.4 pg (ref 26.0–34.0)
MCHC: 30.3 g/dL (ref 30.0–36.0)
MCV: 97.2 fL (ref 80.0–100.0)
Platelets: 318 10*3/uL (ref 150–400)
RBC: 2.82 MIL/uL — AB (ref 3.87–5.11)
RDW: 17.6 % — ABNORMAL HIGH (ref 11.5–15.5)
WBC: 9.5 10*3/uL (ref 4.0–10.5)
nRBC: 0 % (ref 0.0–0.2)

## 2018-06-24 LAB — MAGNESIUM: Magnesium: 1.7 mg/dL (ref 1.7–2.4)

## 2018-06-24 MED ORDER — FUROSEMIDE 10 MG/ML IJ SOLN
40.0000 mg | Freq: Two times a day (BID) | INTRAMUSCULAR | Status: DC
  Administered 2018-06-24 – 2018-06-26 (×4): 40 mg via INTRAVENOUS
  Filled 2018-06-24 (×4): qty 4

## 2018-06-24 MED ORDER — MAGNESIUM SULFATE IN D5W 1-5 GM/100ML-% IV SOLN
1.0000 g | Freq: Once | INTRAVENOUS | Status: AC
  Administered 2018-06-24: 1 g via INTRAVENOUS
  Filled 2018-06-24: qty 100

## 2018-06-24 NOTE — Progress Notes (Signed)
PROGRESS NOTE    [name]  IRC:[id number] DOB: 1950/11/21 DOA: 05/28/2018 PCP: Bernerd Limbo, MD    Brief Narrative:  67 year old woman PMH gallstone pancreatitis August 2019, status post laparoscopic cholecystectomy same hospitalization, presented with abdominal pain, nausea, vomiting.  Admitted for recurrent pancreatitis.  She was seen by gastroenterology, cortrak tube was placed and she was started on enteral feeds.  Continuation has been very slow to improve and was complicated by a mild ileus, acute kidney injury and anasarca. Patient subsequently develop hematemesis, GI bleed, acute blood loss anemia in setting of esophageal ulcers related to tube feeding. patient was transfer to ICU, her Hb drop to 5, and was hypotensive. She was subsequently transfer to step down unit. She has received a total of 5 units PRBC post GI bleed. She is very edematous. She was restarted on oral lasix by nephrology.   Family on 10-15 was concern with patient hands weakness, confusion and tremors, blank staring episodes. MRI was negative for stroke, EEG negative for seizure. Patient is today alert and conversant, generalized weak.  Patient report persistence right side abdominal pain, not better. Will defer to GI further imaging. Patient will need voiding trial.    Subjective: Patient in bed, appears comfortable and in no distress again resting in bed, denies any headache, no fever, no chest pain or pressure, no shortness of breath , ongoing chronic abdominal pain. No focal weakness.   Assessment & Plan:    Acute gallstone pancreatitis, now necrotizing with pseudocyst, pancreatitis recurrent. Developing pseudocyst.  Seen by GI underwent cholecystectomy on 04/19/2018, now on oral diet and supportive care.  Abdomen nontender and benign.  Has chronic aches and pains. Had  tube feeding, below ligament of treitz. Tube feeding removed today, it cause esophageal ulceration.  Acute upper GI bleed, Acute Blood loss  anemia; esophageal ulcer likely due to enteral feeding tube, feeding tube removed, on twice daily PPI, has received multiple units of packed RBCs with stable posttransfusion H&H, repeat H&H today and again tomorrow, no signs of active ongoing bleeding at this time.  AKI on CKD stage V with anasarca; added protein supplementation, was placed on aggressive IV Lasix 60 twice daily with good results she is so far 6 L negative and fluid balance, TED stockings to legs, elevate arms with 2-3 pillows under each arm, much improved.  Will taper down IV Lasix to 40 twice daily on 06/24/2018, discharge once bed available.  Urine retention; acute on Foley and Flomax, trial of Foley removal on 06/22/2018 - failed, Foley had to be replaced with outpatient urology follow-up.     Severe protein caloric malnutrition; added pro-stat.   HTN; Continue with carvedilol, imdur low dose.   Mild upper extremity cellulitis.  Treated with doxycycline resolved.  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  Colonoscopy planned October 28.  Was not iron deficient August.  Chronic A fib [place] vas 2 score of at least 3; stable. Continue with carvedilol for rate control, not on anticoagulation due to recent GI bleed.   COPD, chronic hypoxic respiratory failure on oxygen at home -- Remains stable.  Mild non-ACS pattern troponin rise, due to demand ischemia.  PMH CAD, CABG. -Cardiology recommended medical management, consider outpatient stress test.  Tremors, disorientation, Blanks spells. Per family report. MRI and EEG negative.   Recent endovascular repair of AAA along with common iliac artery stent placement.  Previous MD discussed with Dr. Donzetta Matters currently on aspirin and stable.  Cleared by GI.  Severe deconditioning.  PT, may require placement.     DVT prophylaxis: SCD Code Status: full code.  Family Communication: Daughter updated bedside 06/22/2018 Disposition Plan: Comes from SNF once bed  available, request placed on 06/23/2018 Consultants:  Sx GI Nephrology   Procedures:  Cortrak feeding tube placement on 9/23   Echo; Normal LVF with mild BSH, mild MR and TR, mild LVE, grade 3 diastolic dysfunction.. S/P mitral valve repair with moderate MS. No significant change from prior echo The right ventricular systolic pressure was increased consistent with mild pulmonaryhypertension.    Objective: Vitals:   06/23/18 1805 06/23/18 1947 06/23/18 2046 06/24/18 0449  BP: (!) 153/47  (!) 152/40 (!) 154/54  Pulse: 67 62 66 62  Resp:  16 17 17   Temp:   98.2 F (36.8 C) 98.9 F (37.2 C)  TempSrc:      SpO2:  98% 100% 100%  Weight:    70.6 kg  Height:        Intake/Output Summary (Last 24 hours) at 06/24/2018 0856 Last data filed at 06/24/2018 0450 Gross per 24 hour  Intake -  Output 1000 ml  Net -1000 ml   Filed Weights   06/19/18 0600 06/23/18 0414 06/24/18 0449  Weight: 74.1 kg 72.6 kg 70.6 kg    Examination:  Resting comfortably in bed in no distress, woken up, no focal deficits [place].AT,PERRAL Supple Neck,No JVD, No cervical lymphadenopathy appriciated.  Symmetrical Chest wall movement, Good air movement bilaterally, CTAB RRR,No Gallops, Rubs or new Murmurs, No Parasternal Heave +ve B.Sounds, Abd Soft, No tenderness, No organomegaly appriciated, No rebound - guarding or rigidity. No Cyanosis, Clubbing, trace edema, No new Rash or bruise, Foley in place   Data Reviewed: I have personally reviewed following labs and imaging studies  CBC: Recent Labs  Lab 06/19/18 0500  06/20/18 0844 06/21/18 0750 06/22/18 0330 06/23/18 0956 06/24/18 0520  WBC 14.1*  --  15.3* 14.0* 11.2*  --  9.5  HGB 6.8*   < > 9.8* 9.3* 8.1* 9.1* 8.3*  HCT 21.8*   < > 31.3* 29.4* 26.4* 29.5* 27.4*  MCV 101.9*  --  95.4 93.9 95.7  --  97.2  PLT 226  --  220 237 239  --  318   < > = values in this interval not displayed.   Basic Metabolic Panel: Recent Labs  Lab 06/19/18 0500  06/20/18 0844 06/21/18 0750 06/22/18 0330 06/23/18 0305 06/24/18 0520  NA 137 138 138 138 139  --   K 3.9 3.4* 3.6 3.6 4.0  --   CL 103 102 102 100 100  --   CO2 25 24 26 30 30   --   GLUCOSE 99 110* 101* 128* 108*  --   BUN 109* 100* 89* 81* 84*  --   CREATININE 2.21* 2.21* 2.15* 2.11* 2.03*  --   CALCIUM 8.2* 8.3* 8.2* 8.1* 8.5*  --   MG  --   --  1.5* 1.8 1.7 1.7   GFR: Estimated Creatinine Clearance: 27.2 mL/min (A) (by C-G formula based on SCr of 2.03 mg/dL (H)). Liver Function Tests: No results for input(s): AST, ALT, ALKPHOS, BILITOT, PROT, ALBUMIN in the last 168 hours. No results for input(s): LIPASE, AMYLASE in the last 168 hours. No results for input(s): AMMONIA in the last 168 hours. Coagulation Profile: No results for input(s): INR, PROTIME in the last 168 hours. Cardiac Enzymes: No results for input(s): CKTOTAL, CKMB, CKMBINDEX, TROPONINI in the last 168 hours. BNP (last  3 results) No results for input(s): PROBNP in the last 8760 hours. HbA1C: No results for input(s): HGBA1C in the last 72 hours. CBG: Recent Labs  Lab 06/23/18 1421 06/23/18 1800 06/23/18 2040 06/24/18 0008 06/24/18 0446  GLUCAP 155* 164* 121* 131* 107*   Lipid Profile: No results for input(s): CHOL, HDL, LDLCALC, TRIG, CHOLHDL, LDLDIRECT in the last 72 hours. Thyroid Function Tests: No results for input(s): TSH, T4TOTAL, FREET4, T3FREE, THYROIDAB in the last 72 hours. Anemia Panel: No results for input(s): VITAMINB12, FOLATE, FERRITIN, TIBC, IRON, RETICCTPCT in the last 72 hours. Sepsis Labs: No results for input(s): PROCALCITON, LATICACIDVEN in the last 168 hours.  No results found for this or any previous visit (from the past 240 hour(s)).    Radiology Studies:  No results found.  Scheduled Meds: . ALPRAZolam  0.5 mg Oral QHS  . aspirin EC  81 mg Oral Daily  . carvedilol  12.5 mg Oral BID WC  . cholecalciferol  5,000 Units Oral Daily  . darbepoetin (ARANESP) injection -  NON-DIALYSIS  60 mcg Subcutaneous Q Mon-1800  . DULoxetine  60 mg Oral Daily  . feeding supplement  1 Container Oral TID BM  . feeding supplement (PRO-STAT SUGAR FREE 64)  30 mL Oral TID WC  . furosemide  40 mg Intravenous BID  . gabapentin  200 mg Oral BID  . insulin aspart  0-9 Units Subcutaneous Q4H  . isosorbide mononitrate  15 mg Oral Daily  . lipase/protease/amylase  12,000 Units Oral TID AC  . mouth rinse  15 mL Mouth Rinse BID  . mirtazapine  7.5 mg Oral QHS  . mometasone-formoterol  2 puff Inhalation BID  . nicotine  14 mg Transdermal Daily  . pantoprazole sodium  40 mg Oral BID  . spironolactone  25 mg Oral Daily  . sucralfate  1 g Oral TID WC & HS  . tamsulosin  0.4 mg Oral Daily  . vitamin B-12  100 mcg Oral Daily   Continuous Infusions: . magnesium sulfate 1 - 4 g bolus IVPB     Anti-infectives (From admission, onward)   Start     Dose/Rate Route Frequency Ordered Stop   06/14/18 1030  doxycycline (VIBRAMYCIN) 50 MG/5ML syrup 100 mg     100 mg Per Tube Every 12 hours 06/14/18 1028 06/16/18 2159   06/05/18 1445  meropenem (MERREM) 1 g in sodium chloride 0.9 % 100 mL IVPB     1 g 200 mL/hr over 30 Minutes Intravenous Every 12 hours 06/05/18 1433 06/10/18 1019   05/29/18 0600  piperacillin-tazobactam (ZOSYN) IVPB 3.375 g     3.375 g 12.5 mL/hr over 240 Minutes Intravenous Every 8 hours 05/28/18 2227 06/05/18 1600   05/28/18 2230  piperacillin-tazobactam (ZOSYN) IVPB 3.375 g     3.375 g 100 mL/hr over 30 Minutes Intravenous  Once 05/28/18 2227 05/29/18 0011       LOS: 27 days    Time spent: 35 minutes.   Signature  Lala Lund M.D on 06/24/2018 at 8:56 AM   -  To page go to [email or web address] - password [facility]

## 2018-06-25 ENCOUNTER — Inpatient Hospital Stay (HOSPITAL_COMMUNITY): Payer: Medicare Other

## 2018-06-25 DIAGNOSIS — J411 Mucopurulent chronic bronchitis: Secondary | ICD-10-CM

## 2018-06-25 DIAGNOSIS — N179 Acute kidney failure, unspecified: Secondary | ICD-10-CM

## 2018-06-25 DIAGNOSIS — R609 Edema, unspecified: Secondary | ICD-10-CM

## 2018-06-25 LAB — GLUCOSE, CAPILLARY
GLUCOSE-CAPILLARY: 125 mg/dL — AB (ref 70–99)
GLUCOSE-CAPILLARY: 120 mg/dL — AB (ref 70–99)
GLUCOSE-CAPILLARY: 102 mg/dL — AB (ref 70–99)
Glucose-Capillary: 78 mg/dL (ref 70–99)
Glucose-Capillary: 177 mg/dL — ABNORMAL HIGH (ref 70–99)

## 2018-06-25 LAB — BASIC METABOLIC PANEL
Anion gap: 9 (ref 5–15)
BUN: 83 mg/dL — AB (ref 8–23)
CHLORIDE: 98 mmol/L (ref 98–111)
CO2: 33 mmol/L — AB (ref 22–32)
Calcium: 8.5 mg/dL — ABNORMAL LOW (ref 8.9–10.3)
Creatinine, Ser: 1.94 mg/dL — ABNORMAL HIGH (ref 0.44–1.00)
GFR calc Af Amer: 30 mL/min — ABNORMAL LOW (ref >60)
GFR calc non Af Amer: 26 mL/min — ABNORMAL LOW (ref >60)
GLUCOSE: 87 mg/dL (ref 70–99)
POTASSIUM: 3.4 mmol/L — AB (ref 3.5–5.1)
Sodium: 140 mmol/L (ref 135–145)

## 2018-06-25 LAB — MAGNESIUM: Magnesium: 1.7 mg/dL (ref 1.7–2.4)

## 2018-06-25 MED ORDER — POTASSIUM CHLORIDE CRYS ER 20 MEQ PO TBCR
40.0000 meq | EXTENDED_RELEASE_TABLET | Freq: Two times a day (BID) | ORAL | Status: AC
  Administered 2018-06-25 – 2018-06-26 (×3): 40 meq via ORAL
  Filled 2018-06-25 (×3): qty 2

## 2018-06-25 MED ORDER — MAGNESIUM SULFATE IN D5W 1-5 GM/100ML-% IV SOLN
1.0000 g | Freq: Once | INTRAVENOUS | Status: AC
  Administered 2018-06-25: 1 g via INTRAVENOUS
  Filled 2018-06-25: qty 100

## 2018-06-25 NOTE — Progress Notes (Signed)
Daily Progress Note   Patient Name: [name]       Date: 06/25/2018 DOB: August 02, 1951  Age: 67 y.o. MRN#: [id number] Attending Physician: Thurnell Lose, MD Primary Care Physician: Bernerd Limbo, MD Admit Date: 05/28/2018  Reason for Consultation/Follow-up: Establishing goals of care  Subjective: Patient lying in bed watching TV.  She appears comfortable.  Denies pain or shortness of breath at this time.  Patient does states she continues to have intermittent abdominal pain which is chronic.  Patient reports current pain regimen is effectively managing her pain.  Patient reports she had a great breakfast and continues to tolerate meals and fluids without complications.  Patient states that she is hopeful that she can be discharged to SNF for rehabilitation sometime this week.  Reports she is somewhat concerned about having the Foley catheter and for continuous period of time.  He does verbalize her awareness that Foley is necessary due to her inability to urinate previously one Foley was just continued.  Patient expressed understanding of continuous Foley and Flomax with follow-up by urology.  She also reports noticeable decrease in edema of her upper extremities.  Patient states that left arm is no longer weeping.  Patient does endorse continued generalized weakness however she states her legs no longer feel as heavy and she is able to lift them slightly off of the bed as this was hard for her to do days prior.  She also confirms wishes of having continuous palliative care support at discharge.  I discussed with the patient again her current wishes for full CODE STATUS.  Patient continues to request all aggressive measures be performed in the event of an emergency including CPR, defibrillation,  and intubation with awareness of current condition and co-morbidities.  Patient states her daughters are aware of her wishes and knows that she would only want to be in this position for a limited amount of time or until medical providers deemed that she is in a vegetative state or unable to recover from her illness.   No family at the bedside.  Length of Stay: 28  Current Medications: Scheduled Meds:  . ALPRAZolam  0.5 mg Oral QHS  . aspirin EC  81 mg Oral Daily  . carvedilol  12.5 mg Oral BID WC  . cholecalciferol  5,000 Units Oral Daily  .  darbepoetin (ARANESP) injection - NON-DIALYSIS  60 mcg Subcutaneous Q Mon-1800  . DULoxetine  60 mg Oral Daily  . feeding supplement  1 Container Oral TID BM  . feeding supplement (PRO-STAT SUGAR FREE 64)  30 mL Oral TID WC  . furosemide  40 mg Intravenous BID  . gabapentin  200 mg Oral BID  . insulin aspart  0-9 Units Subcutaneous Q4H  . isosorbide mononitrate  15 mg Oral Daily  . lipase/protease/amylase  12,000 Units Oral TID AC  . mouth rinse  15 mL Mouth Rinse BID  . mirtazapine  7.5 mg Oral QHS  . mometasone-formoterol  2 puff Inhalation BID  . nicotine  14 mg Transdermal Daily  . pantoprazole sodium  40 mg Oral BID  . potassium chloride  40 mEq Oral BID  . spironolactone  25 mg Oral Daily  . sucralfate  1 g Oral TID WC & HS  . tamsulosin  0.4 mg Oral Daily  . vitamin B-12  100 mcg Oral Daily    Continuous Infusions:   PRN Meds: acetaminophen **OR** [DISCONTINUED] acetaminophen, alum & mag hydroxide-simeth, hydrALAZINE, HYDROcodone-acetaminophen, levalbuterol, ondansetron (ZOFRAN) IV, phenol, polyethylene glycol  Physical Exam  Constitutional: She is oriented to person, place, and time. Vital signs are normal. She appears well-developed. She is cooperative.  Chronically ill appearing   Cardiovascular: Normal rate, regular rhythm, normal heart sounds and normal pulses.  Bilateral lower edema R>L   Pulmonary/Chest: Effort normal.  She has decreased breath sounds.  Abdominal: Soft. Normal appearance and bowel sounds are normal.  Musculoskeletal:  Generalized weakness   Neurological: She is alert and oriented to person, place, and time.  Skin: Skin is warm and intact. Bruising noted.  Left arm edema, bilateral lower extremity edema   Psychiatric: She has a normal mood and affect. Her speech is normal and behavior is normal. Judgment and thought content normal. Cognition and memory are normal.  Nursing note and vitals reviewed.           Vital Signs: BP (!) 150/50   Pulse 63   Temp 97.8 F (36.6 C)   Resp 18   Ht 5\' 5"  (1.651 m)   Wt 70.6 kg   SpO2 99%   BMI 25.90 kg/m  SpO2: SpO2: 99 % O2 Device: O2 Device: Nasal Cannula O2 Flow Rate: O2 Flow Rate (L/min): 2 L/min  Intake/output summary:   Intake/Output Summary (Last 24 hours) at 06/25/2018 1106 Last data filed at 06/25/2018 0125 Gross per 24 hour  Intake -  Output 1850 ml  Net -1850 ml   LBM: Last BM Date: 06/23/18 Baseline Weight: Weight: 56.6 kg Most recent weight: Weight: 70.6 kg      Palliative Assessment/Data: PPS 30%   Flowsheet Rows     Most Recent Value  Intake Tab  Referral Department  Hospitalist  Unit at Time of Referral  Cardiac/Telemetry Unit  Palliative Care Primary Diagnosis  Sepsis/Infectious Disease  Date Notified  06/08/18  Palliative Care Type  New Palliative care  Reason for referral  Clarify Goals of Care  Date of Admission  05/28/18  Date first seen by Palliative Care  06/09/18  # of days Palliative referral response time  1 Day(s)  # of days IP prior to Palliative referral  11  Clinical Assessment  Palliative Performance Scale Score  20%  Psychosocial & Spiritual Assessment  Palliative Care Outcomes  Patient/Family meeting held?  Yes  Who was at the meeting?  2 daughters, sister  Palliative Care  Outcomes  Clarified goals of care, Provided psychosocial or spiritual support     Patient Active Problem List    Diagnosis Date Noted  . Physical deconditioning   . Esophageal ulcer with bleeding   . Pressure injury of skin 06/16/2018  . Hematemesis   . AKI (acute kidney injury) ([place]) 06/09/2018  . Goals of care, counseling/discussion   . Palliative care by specialist   . Hyperkalemia 06/08/2018  . Anasarca 06/07/2018  . Demand ischemia ([place]) 06/07/2018  . Protein-calorie malnutrition, severe 05/29/2018  . Type II diabetes mellitus with renal manifestations ([place]) 05/28/2018  . Pancreatitis, recurrent 05/28/2018  . SIRS (systemic inflammatory response syndrome) ([place]) 05/28/2018  . Pancreatitis 05/28/2018  . Volume overload 04/17/2018  . Anemia due to GI blood loss 04/14/2018  . Acute gallstone pancreatitis 04/14/2018  . Acute cholecystitis 04/14/2018  . Gastrointestinal hemorrhage   . Fatigue associated with anemia 04/09/2018  . Hypoglycemia 01/19/2018  . Type II diabetes mellitus ([place]) 01/19/2018  . Nausea with vomiting 01/19/2018  . Uncontrolled hypertension 01/19/2018  . Hyperlipidemia 01/19/2018  . CAD (coronary artery disease) 01/19/2018  . COPD with hypoxia ([place]) 01/19/2018  . Chronic kidney disease (CKD), stage IV (severe) ([place]) 01/19/2018  . Retroperitoneal hematoma 01/19/2018  . Acute blood loss anemia 01/19/2018  . Chronic diastolic congestive heart failure ([place]) 01/19/2018  . AAA (abdominal aortic aneurysm) ([place]) 01/16/2018  . Elevated brain natriuretic peptide (BNP) level 10/07/2015  . Diabetes mellitus ([place]) 10/07/2015  . Pain in the chest   . PAD (peripheral artery disease) ([place]) 07/29/2015  . Chest pain 07/04/2015  . Acute on chronic kidney failure ([place]) 07/04/2015  . GERD (gastroesophageal reflux disease) 07/04/2015  . Anemia 06/26/2015  . Coronary artery disease involving autologous vein coronary bypass graft with unstable angina pectoris ([place]) 06/24/2015  . Angina at rest [facility])   . Subclavian artery stenosis, left ([place]) 06/23/2015  . AAA (abdominal aortic aneurysm)  without rupture ([place]) 12/10/2014  . Subclavian steal syndrome 12/10/2014  . Renal artery stenosis ([place]) 09/04/2013  . Carotid artery stenosis 09/28/2012  . Mitral valve disorder 06/23/2011  . S/P CABG x 3 06/16/2011  . S/P Maze operation for atrial fibrillation 06/16/2011  . S/P MVR (mitral valve repair) 06/16/2011  . Follow-up examination following surgery 03/29/2011  . CKD (chronic kidney disease) 03/03/2011  . COPD (chronic obstructive pulmonary disease) ([place]) 03/03/2011  . Chronic diastolic heart failure ([place]) 02/26/2011  . Hypotension 02/26/2011  . NSTEMI (non-ST elevated myocardial infarction) ([place]) 02/26/2011  . Long term (current) use of anticoagulants 12/17/2010  . OBSTRUCTIVE SLEEP APNEA 09/04/2010  . HYPOXEMIA 09/04/2010  . SNORING 08/05/2010  . Atrial fibrillation ([place]) 12/09/2009  . WEIGHT LOSS 03/28/2009  . PALPITATIONS 03/28/2009  . PVD 11/21/2008  . HYPERLIPIDEMIA-MIXED 11/20/2008  . TOBACCO ABUSE 11/20/2008  . Essential hypertension 11/20/2008  . Coronary artery disease involving native heart with angina pectoris ([place]) 11/20/2008  . ABDOMINAL AORTIC ANEURYSM 11/20/2008    Palliative Care Assessment & Plan   Patient Profile: 68 y.o.femalewith past medical history of hypertension, hyperlipidemia,DM, COPD on home oxygen, GERD, gout, depression,anxiety, tobacco abuse, atrial fibrillation not on anticoagulants, AAA, CAD, CABG, CHF, CKD4, GI bleeding, mitral valve regurgitation, PADadmitted on 9/22/2019with abdominal pain.Patient was recently admitted at the beginning of August d/t GI bleed and gallstone pancreatitis. She had laparoscopic cholecystectomy during that hospitalization. This admission, diagnosed with pancreatitis - recurrent vs smoldering. CT concerning for pseudocyst and necrosis. Autoimmune pancreatitis has been ruled out. Feeding tube was placed and enteral  feeds started. 10/4 tube removed and replaced post pyloric. Patient also has AKI on CKD4  with unclear etiology. Nephrology has been consulted. PMT consulted for GOC, symptom management, and support for family.  Recommendations/Plan:  Full Code  Continue to treat the treatable while hospitalized. Patient continues to express wishes for aggressive measures including CPR, defibrillation, and intubation.   Patient hopeful for discharge to SNF the earlier part of upcoming week.   Outpatient Palliative at discharge.   PMT will continue to support.   Goals of Care and Additional Recommendations:  Limitations on Scope of Treatment: Full Scope Treatment  Code Status:    Code Status Orders  (From admission, onward)         Start     Ordered   05/28/18 2223  Full code  Continuous     05/28/18 2223        Code Status History    Date Active Date Inactive Code Status Order ID Comments User Context   04/10/2018 0001 04/21/2018 1936 Full Code [id number]  Vilma Prader, MD ED   01/16/2018 2354 01/17/2018 2117 Full Code [id number]  Rosetta Posner, MD Inpatient   10/06/2015 2227 10/08/2015 1814 Full Code [id number]  Norval Morton, MD Inpatient   07/04/2015 0450 07/04/2015 1821 Full Code [id number]  Edwin Dada, MD Inpatient   06/25/2015 1611 06/26/2015 1745 Full Code [id number]  Sherren Mocha, MD Inpatient   06/24/2015 1114 06/25/2015 1611 Full Code [id number]  Serafina Mitchell, MD Inpatient   06/24/2015 1048 06/24/2015 1114 Full Code [id number]  Leonie Man, MD Inpatient       Prognosis:   Unable to determine  Discharge Planning:  [place] for rehab with Palliative care service follow-up  Care plan was discussed with patient and bedside RN.   Thank you for allowing the Palliative Medicine Team to assist in the care of this patient.   Total Time 35 min.  Prolonged Time Billed NO       Greater than 50%  of this time was spent counseling and coordinating care related to the above assessment and plan.  Alda Lea,  AGPCNP-BC Palliative Medicine Team  Phone: [phone] Pager: [phone] Amion: Bjorn Pippin   Please contact Palliative Medicine Team phone at [phone] for questions and concerns.

## 2018-06-25 NOTE — Progress Notes (Signed)
PROGRESS NOTE    [name]  BJY:[id number] DOB: 10-21-1950 DOA: 05/28/2018 PCP: Bernerd Limbo, MD    Brief Narrative:  67 year old woman PMH gallstone pancreatitis August 2019, status post laparoscopic cholecystectomy same hospitalization, presented with abdominal pain, nausea, vomiting.  Admitted for recurrent pancreatitis.  She was seen by gastroenterology, cortrak tube was placed and she was started on enteral feeds.  Continuation has been very slow to improve and was complicated by a mild ileus, acute kidney injury and anasarca. Patient subsequently develop hematemesis, GI bleed, acute blood loss anemia in setting of esophageal ulcers related to tube feeding. patient was transfer to ICU, her Hb drop to 5, and was hypotensive. She was subsequently transfer to step down unit. She has received a total of 5 units PRBC post GI bleed. She is very edematous. She was restarted on oral lasix by nephrology.   Family on 10-15 was concern with patient hands weakness, confusion and tremors, blank staring episodes. MRI was negative for stroke, EEG negative for seizure. Patient is today alert and conversant, generalized weak.  Patient report persistence right side abdominal pain, not better. Will defer to GI further imaging. Patient will need voiding trial.    Subjective: Patient in bed, appears comfortable, denies any headache, no fever, no chest pain or pressure, no shortness of breath , no abdominal pain. No focal weakness.  Again when woken up from a comfortable resting position asking for pain medications to be given for chronic abdominal pain.  Assessment & Plan:    Acute gallstone pancreatitis, now necrotizing with pseudocyst, pancreatitis recurrent. Developing pseudocyst.  Seen by GI underwent cholecystectomy on 04/19/2018, now on oral diet and supportive care.  Abdomen nontender and benign.  Has chronic aches and pains. Had  tube feeding, below ligament of treitz. Tube feeding removed today, it  cause esophageal ulceration.  Acute upper GI bleed, Acute Blood loss anemia; esophageal ulcer likely due to enteral feeding tube, feeding tube removed, on twice daily PPI, has received multiple units of packed RBCs with stable posttransfusion H&H, repeat H&H today and again tomorrow, no signs of active ongoing bleeding at this time.  AKI on CKD stage V with anasarca; added protein supplementation, was placed on aggressive IV Lasix 60 twice daily with good results she is so far 6 L negative and fluid balance, TED stockings to legs, elevate arms with 2-3 pillows under each arm, much improved.  Will taper down IV Lasix to 40 twice daily on 06/24/2018, discharge once bed available.  Urine retention; acute on Foley and Flomax, trial of Foley removal on 06/22/2018 - failed, Foley had to be replaced with outpatient urology follow-up.     Severe protein caloric malnutrition; added pro-stat.   HTN; Continue with carvedilol, imdur low dose.   Mild upper extremity cellulitis.  Treated with doxycycline resolved.  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  Colonoscopy planned October 28.  Was not iron deficient August.  Chronic A fib [place] vas 2 score of at least 3; stable. Continue with carvedilol for rate control, not on anticoagulation due to recent GI bleed.   COPD, chronic hypoxic respiratory failure on oxygen at home -- Remains stable.  Mild non-ACS pattern troponin rise, due to demand ischemia.  PMH CAD, CABG. -Cardiology recommended medical management, consider outpatient stress test.  Tremors, disorientation, Blanks spells. Per family report. MRI and EEG negative.   Recent endovascular repair of AAA along with common iliac artery stent placement.  Previous MD discussed with  Dr. Donzetta Matters currently on aspirin and stable.  Cleared by GI.  Severe deconditioning.  PT, may require placement.  Some disproportionate left upper extremity edema.  Check vascular ultrasound, likely  dependent edema.   DVT prophylaxis: SCD Code Status: full code.  Family Communication: Daughter updated bedside 06/22/2018 Disposition Plan: Comes from SNF once bed available, request placed on 06/23/2018 Consultants:  Sx GI Nephrology   Procedures:  Cortrak feeding tube placement on 9/23   Echo; Normal LVF with mild BSH, mild MR and TR, mild LVE, grade 3 diastolic dysfunction.. S/P mitral valve repair with moderate MS. No significant change from prior echo The right ventricular systolic pressure was increased consistent with mild pulmonaryhypertension.    Objective: Vitals:   06/24/18 0449 06/24/18 2019 06/24/18 2110 06/25/18 0450  BP: (!) 154/54  (!) 173/43 (!) 148/43  Pulse: 62 64 62 (!) 57  Resp: 17 18    Temp: 98.9 F (37.2 C)  98 F (36.7 C) 97.8 F (36.6 C)  TempSrc:      SpO2: 100% 100% 100% 98%  Weight: 70.6 kg     Height:        Intake/Output Summary (Last 24 hours) at 06/25/2018 0805 Last data filed at 06/25/2018 0125 Gross per 24 hour  Intake -  Output 1850 ml  Net -1850 ml   Filed Weights   06/19/18 0600 06/23/18 0414 06/24/18 0449  Weight: 74.1 kg 72.6 kg 70.6 kg    Examination:  Awake Alert, in no distress resting comfortably [place].AT,PERRAL Supple Neck,No JVD, No cervical lymphadenopathy appriciated.  Symmetrical Chest wall movement, Good air movement bilaterally, CTAB RRR,No Gallops, Rubs or new Murmurs, No Parasternal Heave +ve B.Sounds, Abd Soft, No tenderness, No organomegaly appriciated, No rebound - guarding or rigidity. No Cyanosis, Clubbing, 1+ edema mostly in the left upper extremity, Foley in place   Data Reviewed: I have personally reviewed following labs and imaging studies  CBC: Recent Labs  Lab 06/19/18 0500  06/20/18 0844 06/21/18 0750 06/22/18 0330 06/23/18 0956 06/24/18 0520  WBC 14.1*  --  15.3* 14.0* 11.2*  --  9.5  HGB 6.8*   < > 9.8* 9.3* 8.1* 9.1* 8.3*  HCT 21.8*   < > 31.3* 29.4* 26.4* 29.5* 27.4*  MCV  101.9*  --  95.4 93.9 95.7  --  97.2  PLT 226  --  220 237 239  --  318   < > = values in this interval not displayed.   Basic Metabolic Panel: Recent Labs  Lab 06/21/18 0750 06/22/18 0330 06/23/18 0305 06/24/18 0520 06/25/18 0259  NA 138 138 139 140 140  K 3.6 3.6 4.0 3.7 3.4*  CL 102 100 100 100 98  CO2 26 30 30  32 33*  GLUCOSE 101* 128* 108* 109* 87  BUN 89* 81* 84* 90* 83*  CREATININE 2.15* 2.11* 2.03* 1.94* 1.94*  CALCIUM 8.2* 8.1* 8.5* 8.4* 8.5*  MG 1.5* 1.8 1.7 1.7 1.7   GFR: Estimated Creatinine Clearance: 28.5 mL/min (A) (by C-G formula based on SCr of 1.94 mg/dL (H)). Liver Function Tests: No results for input(s): AST, ALT, ALKPHOS, BILITOT, PROT, ALBUMIN in the last 168 hours. No results for input(s): LIPASE, AMYLASE in the last 168 hours. No results for input(s): AMMONIA in the last 168 hours. Coagulation Profile: No results for input(s): INR, PROTIME in the last 168 hours. Cardiac Enzymes: No results for input(s): CKTOTAL, CKMB, CKMBINDEX, TROPONINI in the last 168 hours. BNP (last 3 results) No results for input(s): PROBNP  in the last 8760 hours. HbA1C: No results for input(s): HGBA1C in the last 72 hours. CBG: Recent Labs  Lab 06/24/18 1444 06/24/18 1744 06/24/18 2011 06/24/18 2347 06/25/18 0440  GLUCAP 101* 116* 178* 162* 78   Lipid Profile: No results for input(s): CHOL, HDL, LDLCALC, TRIG, CHOLHDL, LDLDIRECT in the last 72 hours. Thyroid Function Tests: No results for input(s): TSH, T4TOTAL, FREET4, T3FREE, THYROIDAB in the last 72 hours. Anemia Panel: No results for input(s): VITAMINB12, FOLATE, FERRITIN, TIBC, IRON, RETICCTPCT in the last 72 hours. Sepsis Labs: No results for input(s): PROCALCITON, LATICACIDVEN in the last 168 hours.  No results found for this or any previous visit (from the past 240 hour(s)).    Radiology Studies:  No results found.  Scheduled Meds: . ALPRAZolam  0.5 mg Oral QHS  . aspirin EC  81 mg Oral Daily  .  carvedilol  12.5 mg Oral BID WC  . cholecalciferol  5,000 Units Oral Daily  . darbepoetin (ARANESP) injection - NON-DIALYSIS  60 mcg Subcutaneous Q Mon-1800  . DULoxetine  60 mg Oral Daily  . feeding supplement  1 Container Oral TID BM  . feeding supplement (PRO-STAT SUGAR FREE 64)  30 mL Oral TID WC  . furosemide  40 mg Intravenous BID  . gabapentin  200 mg Oral BID  . insulin aspart  0-9 Units Subcutaneous Q4H  . isosorbide mononitrate  15 mg Oral Daily  . lipase/protease/amylase  12,000 Units Oral TID AC  . mouth rinse  15 mL Mouth Rinse BID  . mirtazapine  7.5 mg Oral QHS  . mometasone-formoterol  2 puff Inhalation BID  . nicotine  14 mg Transdermal Daily  . pantoprazole sodium  40 mg Oral BID  . potassium chloride  40 mEq Oral BID  . spironolactone  25 mg Oral Daily  . sucralfate  1 g Oral TID WC & HS  . tamsulosin  0.4 mg Oral Daily  . vitamin B-12  100 mcg Oral Daily   Continuous Infusions: . magnesium sulfate 1 - 4 g bolus IVPB     Anti-infectives (From admission, onward)   Start     Dose/Rate Route Frequency Ordered Stop   06/14/18 1030  doxycycline (VIBRAMYCIN) 50 MG/5ML syrup 100 mg     100 mg Per Tube Every 12 hours 06/14/18 1028 06/16/18 2159   06/05/18 1445  meropenem (MERREM) 1 g in sodium chloride 0.9 % 100 mL IVPB     1 g 200 mL/hr over 30 Minutes Intravenous Every 12 hours 06/05/18 1433 06/10/18 1019   05/29/18 0600  piperacillin-tazobactam (ZOSYN) IVPB 3.375 g     3.375 g 12.5 mL/hr over 240 Minutes Intravenous Every 8 hours 05/28/18 2227 06/05/18 1600   05/28/18 2230  piperacillin-tazobactam (ZOSYN) IVPB 3.375 g     3.375 g 100 mL/hr over 30 Minutes Intravenous  Once 05/28/18 2227 05/29/18 0011       LOS: 28 days    Time spent: 35 minutes.   Signature  Lala Lund M.D on 06/25/2018 at 8:05 AM   -  To page go to [email or web address] - password [facility]

## 2018-06-25 NOTE — Progress Notes (Signed)
VASCULAR LAB PRELIMINARY  PRELIMINARY  PRELIMINARY  PRELIMINARY  Bilateral upper extremity venous duplex completed.    Preliminary report:  There is no DVT or SVT noted in the bilateral upper extremities.   Nasia Cannan, RVT 06/25/2018, 4:02 PM

## 2018-06-26 ENCOUNTER — Inpatient Hospital Stay (HOSPITAL_COMMUNITY): Payer: Medicare Other

## 2018-06-26 ENCOUNTER — Telehealth: Payer: Self-pay

## 2018-06-26 DIAGNOSIS — K851 Biliary acute pancreatitis without necrosis or infection: Secondary | ICD-10-CM

## 2018-06-26 LAB — POTASSIUM: Potassium: 4.2 mmol/L (ref 3.5–5.1)

## 2018-06-26 LAB — GLUCOSE, CAPILLARY
Glucose-Capillary: 113 mg/dL — ABNORMAL HIGH (ref 70–99)
Glucose-Capillary: 114 mg/dL — ABNORMAL HIGH (ref 70–99)
Glucose-Capillary: 186 mg/dL — ABNORMAL HIGH (ref 70–99)

## 2018-06-26 MED ORDER — HYDRALAZINE HCL 25 MG PO TABS: 25.0000 mg | ORAL_TABLET | Freq: Three times a day (TID) | ORAL | Status: DC

## 2018-06-26 MED ORDER — HYDRALAZINE HCL 25 MG PO TABS
25.0000 mg | ORAL_TABLET | Freq: Three times a day (TID) | ORAL | Status: DC
  Administered 2018-06-26: 25 mg via ORAL
  Filled 2018-06-26: qty 1

## 2018-06-26 MED ORDER — PRO-STAT SUGAR FREE PO LIQD: 30.0000 mL | Freq: Three times a day (TID) | ORAL | 0 refills | Status: DC

## 2018-06-26 MED ORDER — PANTOPRAZOLE SODIUM 40 MG PO TBEC: 40.0000 mg | DELAYED_RELEASE_TABLET | Freq: Two times a day (BID) | ORAL | 0 refills | Status: DC

## 2018-06-26 MED ORDER — CYANOCOBALAMIN 100 MCG PO TABS: 100.0000 ug | ORAL_TABLET | Freq: Every day | ORAL | Status: DC

## 2018-06-26 MED ORDER — FUROSEMIDE 40 MG PO TABS: 40.0000 mg | ORAL_TABLET | Freq: Two times a day (BID) | ORAL | Status: DC

## 2018-06-26 MED ORDER — POLYETHYLENE GLYCOL 3350 17 G PO PACK: 17.0000 g | PACK | Freq: Every day | ORAL | 0 refills | Status: DC | PRN

## 2018-06-26 MED ORDER — HYDROCODONE-ACETAMINOPHEN 5-325 MG PO TABS: 1.0000 | ORAL_TABLET | Freq: Four times a day (QID) | ORAL | 0 refills | Status: DC | PRN

## 2018-06-26 MED ORDER — TAMSULOSIN HCL 0.4 MG PO CAPS: 0.4000 mg | ORAL_CAPSULE | Freq: Every day | ORAL | Status: DC

## 2018-06-26 MED ORDER — NICOTINE 14 MG/24HR TD PT24: 14.0000 mg | MEDICATED_PATCH | Freq: Every day | TRANSDERMAL | 0 refills | Status: DC

## 2018-06-26 MED ORDER — SPIRONOLACTONE 25 MG PO TABS: 25.0000 mg | ORAL_TABLET | Freq: Every day | ORAL | Status: DC

## 2018-06-26 MED ORDER — CARVEDILOL 12.5 MG PO TABS: 12.5000 mg | ORAL_TABLET | Freq: Two times a day (BID) | ORAL | Status: DC

## 2018-06-26 NOTE — Progress Notes (Signed)
Report called to [place] [facility].

## 2018-06-26 NOTE — Telephone Encounter (Signed)
-----   Message from Irving Copas., MD sent at 06/25/2018  6:37 PM EDT ----- Regarding: [facility] [name], This is a patient that will be seen in follow up with me in a few weeks once she leaves the hospital. She has seen Merrie Roof as well in the past, so if I'm not free she can see Merrie Roof who had seen her for other issues prior to the episode of pancreatitis. Can we schedule a CT-Abdomen/Pelvis non-contrast to be done 1-2 days before so that we can have that information for our clinic visit? Thanks. Chester Holstein

## 2018-06-26 NOTE — Progress Notes (Signed)
Nsg Discharge Note  Admit Date:  05/28/2018 Discharge date: 06/26/2018   Linden Dolin Kirley to be D/C'd Skilled nursing facility per MD order.  AVS completed.  Copy for chart, and copy for patient, and copy for facility. Patient/caregiver able to verbalize understanding.  Discharge Medication: Allergies as of 06/26/2018      Reactions   Isosorbide Other (See Comments)   Can only tolerate in low doses   Isosorbide Nitrate Other (See Comments)   Can only tolerate in low doses   Oxycodone-acetaminophen Itching   Codeine Itching   Contrast Media [iodinated Diagnostic Agents] Itching, Other (See Comments)   Itching of feet   Ioxaglate Itching, Other (See Comments)   Itching of feet   Metrizamide Itching, Other (See Comments)   Itching of feet   Percocet [oxycodone-acetaminophen] Itching, Other (See Comments)   Tolerates Hydrocodone      Medication List    STOP taking these medications   ALPRAZolam 0.5 MG tablet Commonly known as:  XANAX   cloNIDine 0.2 MG tablet Commonly known as:  CATAPRES   Potassium 99 MG Tabs   promethazine 25 MG tablet Commonly known as:  PHENERGAN     TAKE these medications   acetaminophen 325 MG tablet Commonly known as:  TYLENOL Take 325 mg by mouth every 6 (six) hours as needed (for pain).   albuterol 108 (90 Base) MCG/ACT inhaler Commonly known as:  PROVENTIL HFA;VENTOLIN HFA Inhale 2 puffs into the lungs every 4 (four) hours as needed for wheezing or shortness of breath. ProAir What changed:  additional instructions   allopurinol 100 MG tablet Commonly known as:  ZYLOPRIM Take 100 mg by mouth daily.   amLODipine 10 MG tablet Commonly known as:  NORVASC Take 10 mg by mouth daily.   atorvastatin 40 MG tablet Commonly known as:  LIPITOR Take 40 mg by mouth every evening.   budesonide-formoterol 160-4.5 MCG/ACT inhaler Commonly known as:  SYMBICORT Inhale 2 puffs into the lungs 2 (two) times daily as needed (for flares).   carvedilol  12.5 MG tablet Commonly known as:  COREG Take 1 tablet (12.5 mg total) by mouth 2 (two) times daily with a meal. What changed:    medication strength  See the new instructions.   clopidogrel 75 MG tablet Commonly known as:  PLAVIX Take 1 tablet (75 mg total) by mouth daily with breakfast.   cyanocobalamin 100 MCG tablet Take 1 tablet (100 mcg total) by mouth daily.   docusate sodium 100 MG capsule Commonly known as:  COLACE Take 200 mg by mouth daily as needed (when taking pain medications).   DULoxetine 60 MG capsule Commonly known as:  CYMBALTA Take 60 mg by mouth daily.   famotidine 20 MG tablet Commonly known as:  PEPCID TAKE 1 TABLET (20 MG TOTAL) BY MOUTH DAILY BEFORE SUPPER. What changed:  See the new instructions.   feeding supplement (PRO-STAT SUGAR FREE 64) Liqd Take 30 mLs by mouth 3 (three) times daily with meals.   fenofibrate 160 MG tablet Take 1 tablet (160 mg total) by mouth daily. What changed:  when to take this   ferrous sulfate 325 (65 FE) MG tablet Take 325 mg by mouth 2 (two) times daily with a meal.   furosemide 40 MG tablet Commonly known as:  LASIX Take 1 tablet (40 mg total) by mouth 2 (two) times daily. What changed:    when to take this  additional instructions   gabapentin 300 MG capsule Commonly known as:  NEURONTIN TAKE 1 CAPSULE(300 MG) BY MOUTH TWICE DAILY What changed:  See the new instructions.   hydrALAZINE 25 MG tablet Commonly known as:  APRESOLINE Take 1 tablet (25 mg total) by mouth every 8 (eight) hours. What changed:    medication strength  how much to take  when to take this   HYDROcodone-acetaminophen 5-325 MG tablet Commonly known as:  NORCO/VICODIN Take 1 tablet by mouth every 6 (six) hours as needed for moderate pain.   isosorbide mononitrate 30 MG 24 hr tablet Commonly known as:  IMDUR Take 0.5 tablets (15 mg total) by mouth daily.   mirtazapine 7.5 MG tablet Commonly known as:  REMERON Take 7.5  mg by mouth at bedtime.   nicotine 14 mg/24hr patch Commonly known as:  NICODERM CQ - dosed in mg/24 hours Place 1 patch (14 mg total) onto the skin daily.   pantoprazole 40 MG tablet Commonly known as:  PROTONIX Take 1 tablet (40 mg total) by mouth 2 (two) times daily. What changed:  when to take this   polyethylene glycol packet Commonly known as:  MIRALAX / GLYCOLAX Take 17 g by mouth daily as needed for mild constipation.   spironolactone 25 MG tablet Commonly known as:  ALDACTONE Take 1 tablet (25 mg total) by mouth daily.   tamsulosin 0.4 MG Caps capsule Commonly known as:  FLOMAX Take 1 capsule (0.4 mg total) by mouth daily.   Vitamin D3 5000 units Tabs Take 5,000 Units by mouth daily.       Discharge Assessment: Vitals:   06/26/18 0426 06/26/18 0915  BP: (!) 173/46   Pulse: 63   Resp:    Temp: 98.4 F (36.9 C)   SpO2: 98% 98%   Skin clean, dry and intact without evidence of skin break down, no evidence of skin tears noted. IV catheter discontinued intact. Site without signs and symptoms of complications - no redness or edema noted at insertion site, patient denies c/o pain - only slight tenderness at site.  Dressing with slight pressure applied.  D/c Instructions-Education: Discharge instructions given to patient/family with verbalized understanding. D/c education completed with patient/family including follow up instructions, medication list, d/c activities limitations if indicated, with other d/c instructions as indicated by MD - patient able to verbalize understanding, all questions fully answered. Patient instructed to return to ED, call 911, or call MD for any changes in condition.  Patient discharged to [facility] via [place] with Foley in place.   [place] N Elsie Sakuma, RN 06/26/2018 12:14 PM

## 2018-06-26 NOTE — Discharge Summary (Signed)
[name] IRW:[id number] DOB: 12-14-1950 DOA: 05/28/2018  PCP: Bernerd Limbo, MD  Admit date: 05/28/2018  Discharge date: 06/26/2018  Admitted From: Home   Disposition: SNF   Recommendations for Outpatient Follow-up:   Follow up with PCP in 1-2 weeks  PCP Please obtain BMP/CBC, 2 view CXR in 1week,  (see Discharge instructions)   PCP Please follow up on the following pending results: Monitor CBC and BMP closely   Home Health: None   Equipment/Devices: None  Consultations: GI Discharge Condition: Fair   CODE STATUS: Full   Diet Recommendation: Soft heart healthy diet with strict 1.5 L/day total fluid restriction   Chief Complaint  Patient presents with  . Abdominal Pain  . Emesis     Brief history of present illness from the day of admission and additional interim summary    67 year old woman PMH gallstone pancreatitis August 2019, status post laparoscopic cholecystectomy same hospitalization, presented with abdominal pain, nausea, vomiting. Admitted for recurrent pancreatitis. She was seen by gastroenterology, cortrak tube was placed and she was started on enteral feeds.Continuation has been very slow to improve and was complicated by a mild ileus, acute kidney injury and anasarca. Patient subsequently develop hematemesis, GI bleed, acute blood loss anemia in setting of esophageal ulcers related to tube feeding. patient was transfer to ICU, her Hb drop to 5, and was hypotensive. She was subsequently transfer to step down unit. She has received a total of 5 units PRBC post GI bleed. She is very edematous. She was restarted on oral lasix by nephrology.   Family on 10-15 was concern with patient hands weakness, confusion and tremors, blank staring episodes. MRI was negative for stroke, EEG negative for  seizure. Patient is today alert and conversant, generalized weak.  Patient report persistence right side abdominal pain, not better. Will defer to GI further imaging. Patient will need voiding trial.                                                                  Hospital Course   Acute gallstone pancreatitis, now necrotizing with pseudocyst, pancreatitis recurrent. Developing pseudocyst.  Seen by GI underwent cholecystectomy on 04/19/2018, now on oral diet and supportive care.  Abdomen nontender and benign.  Has chronic aches and pains tolerating soft diet, in no discomfort but looks like she is used to taking chronic narcotics which are continued at home dose at this time.  She appears to be in no discomfort.  Recommend outpatient GI follow-up in a month post discharge.  Acute upper GI bleed, Acute Blood loss anemia; esophageal ulcer likely due to enteral feeding tube, feeding tube removed, on twice daily PPI, has received multiple units of packed RBCs with stable posttransfusion H&H, no signs of active ongoing bleeding at this time.  Outpatient [facility]  follow-up post discharge in 3 to 4 weeks.  AKI on CKD stage V with anasarca; added protein supplementation, was placed on aggressive IV Lasix 60 twice daily with good results she is so far 6 L negative and fluid balance, TED stockings to legs, elevate arms with 2-3 pillows under each arm, much improved.  Will taper down PO Lasix to 40 twice daily along with low-dose Aldactone, request SNF MD to monitor BMP and magnesium closely and adjust diuretic dose as needed, imperative to keep both arms elevated with 2 pillows each under them at all times.  Urine retention; acute on Foley and Flomax, trial of Foley removal on 06/22/2018 - failed, Foley had to be replaced with outpatient urology follow-up 1 to 2 weeks recommended.     Severe protein caloric malnutrition; added pro-stat.   HTN; Continue with carvedilol, imdur low dose.   Mild upper  extremity cellulitis.  Treated with doxycycline resolved.  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 Colonoscopy planned October 28. Was not iron deficient August.  Outpatient GI follow-up in a month with [facility].  Chronic A fib [place] vas 2 score of at least 3; stable. Continue with carvedilol for rate control, not on anticoagulation due to recent GI bleed.   COPD, chronic hypoxic respiratory failure on oxygen at home -- Remains stable.  Mild non-ACS pattern troponin rise, due to demand ischemia. PMH CAD, CABG. -Cardiology recommended medical management, consider outpatient stress test and cardiology follow-up.  Tremors, disorientation, Blanks spells. Per family report. MRI and EEG negative.   Recent endovascular repair of AAA along with common iliac artery stent placement.  Previous MD discussed with Dr. Donzetta Matters currently on Plavix and statin.  H&H closely.  All with vascular surgeon Dr. Gwenlyn Saran in 1 to 2 weeks after discharge.  Severe deconditioning.  PT, SNF.  Some disproportionate left upper extremity edema.  Stable x-ray and vascular ultrasound, no fracture or DVT.  Keep both arms elevated with 2 pillows under them at all times.  Continue Lasix PO BID and monitor BMP closely.  Discharge diagnosis     Principal Problem:   Pancreatitis, recurrent Active Problems:   TOBACCO ABUSE   Essential hypertension   Atrial fibrillation (HCC)   COPD (chronic obstructive pulmonary disease) (HCC)   S/P CABG x 3   Anemia   GERD (gastroesophageal reflux disease)   Hyperlipidemia   CAD (coronary artery disease)   Chronic kidney disease (CKD), stage IV (severe) (HCC)   Chronic diastolic congestive heart failure (HCC)   Anemia due to GI blood loss   Type II diabetes mellitus with renal manifestations (HCC)   SIRS (systemic inflammatory response syndrome) (HCC)   Protein-calorie malnutrition, severe   Anasarca   Demand ischemia (HCC)   Hyperkalemia   AKI (acute  kidney injury) ([place])   Goals of care, counseling/discussion   Palliative care by specialist   Hematemesis   Pressure injury of skin   Esophageal ulcer with bleeding   Physical deconditioning    Discharge instructions    Discharge Instructions    Diet - low sodium heart healthy   Complete by:  As directed    Discharge instructions   Complete by:  As directed    Follow with Primary MD Bernerd Limbo, MD in 7 days   Get CBC, CMP, 2 view Chest X ray -  checked  by Primary MD or SNF MD in 3 days    Activity: As tolerated with Full fall precautions use walker/cane &  assistance as needed  Disposition SNF  Diet: Soft heart healthy diet with feeding assistance and aspiration precautions.  1.5 L/day total fluid restriction.   Special Instructions: If you have smoked or chewed Tobacco  in the last 2 yrs please stop smoking, stop any regular Alcohol  and or any Recreational drug use.  On your next visit with your primary care physician please Get Medicines reviewed and adjusted.  Please request your Prim.MD to go over all Hospital Tests and Procedure/Radiological results at the follow up, please get all Hospital records sent to your Prim MD by signing hospital release before you go home.  If you experience worsening of your admission symptoms, develop shortness of breath, life threatening emergency, suicidal or homicidal thoughts you must seek medical attention immediately by calling 911 or calling your MD immediately  if symptoms less severe.  You Must read complete instructions/literature along with all the possible adverse reactions/side effects for all the Medicines you take and that have been prescribed to you. Take any new Medicines after you have completely understood and accpet all the possible adverse reactions/side effects.   Increase activity slowly   Complete by:  As directed       Discharge Medications   Allergies as of 06/26/2018      Reactions   Isosorbide Other (See  Comments)   Can only tolerate in low doses   Isosorbide Nitrate Other (See Comments)   Can only tolerate in low doses   Oxycodone-acetaminophen Itching   Codeine Itching   Contrast Media [iodinated Diagnostic Agents] Itching, Other (See Comments)   Itching of feet   Ioxaglate Itching, Other (See Comments)   Itching of feet   Metrizamide Itching, Other (See Comments)   Itching of feet   Percocet [oxycodone-acetaminophen] Itching, Other (See Comments)   Tolerates Hydrocodone      Medication List    STOP taking these medications   ALPRAZolam 0.5 MG tablet Commonly known as:  XANAX   cloNIDine 0.2 MG tablet Commonly known as:  CATAPRES   Potassium 99 MG Tabs   promethazine 25 MG tablet Commonly known as:  PHENERGAN     TAKE these medications   acetaminophen 325 MG tablet Commonly known as:  TYLENOL Take 325 mg by mouth every 6 (six) hours as needed (for pain).   albuterol 108 (90 Base) MCG/ACT inhaler Commonly known as:  PROVENTIL HFA;VENTOLIN HFA Inhale 2 puffs into the lungs every 4 (four) hours as needed for wheezing or shortness of breath. ProAir What changed:  additional instructions   allopurinol 100 MG tablet Commonly known as:  ZYLOPRIM Take 100 mg by mouth daily.   amLODipine 10 MG tablet Commonly known as:  NORVASC Take 10 mg by mouth daily.   atorvastatin 40 MG tablet Commonly known as:  LIPITOR Take 40 mg by mouth every evening.   budesonide-formoterol 160-4.5 MCG/ACT inhaler Commonly known as:  SYMBICORT Inhale 2 puffs into the lungs 2 (two) times daily as needed (for flares).   carvedilol 12.5 MG tablet Commonly known as:  COREG Take 1 tablet (12.5 mg total) by mouth 2 (two) times daily with a meal. What changed:    medication strength  See the new instructions.   clopidogrel 75 MG tablet Commonly known as:  PLAVIX Take 1 tablet (75 mg total) by mouth daily with breakfast.   cyanocobalamin 100 MCG tablet Take 1 tablet (100 mcg total) by  mouth daily.   docusate sodium 100 MG capsule Commonly known as:  COLACE  Take 200 mg by mouth daily as needed (when taking pain medications).   DULoxetine 60 MG capsule Commonly known as:  CYMBALTA Take 60 mg by mouth daily.   famotidine 20 MG tablet Commonly known as:  PEPCID TAKE 1 TABLET (20 MG TOTAL) BY MOUTH DAILY BEFORE SUPPER. What changed:  See the new instructions.   feeding supplement (PRO-STAT SUGAR FREE 64) Liqd Take 30 mLs by mouth 3 (three) times daily with meals.   fenofibrate 160 MG tablet Take 1 tablet (160 mg total) by mouth daily. What changed:  when to take this   ferrous sulfate 325 (65 FE) MG tablet Take 325 mg by mouth 2 (two) times daily with a meal.   furosemide 40 MG tablet Commonly known as:  LASIX Take 1 tablet (40 mg total) by mouth 2 (two) times daily. What changed:    when to take this  additional instructions   gabapentin 300 MG capsule Commonly known as:  NEURONTIN TAKE 1 CAPSULE(300 MG) BY MOUTH TWICE DAILY What changed:  See the new instructions.   hydrALAZINE 25 MG tablet Commonly known as:  APRESOLINE Take 1 tablet (25 mg total) by mouth every 8 (eight) hours. What changed:    medication strength  how much to take  when to take this   HYDROcodone-acetaminophen 5-325 MG tablet Commonly known as:  NORCO/VICODIN Take 1 tablet by mouth every 6 (six) hours as needed for moderate pain.   isosorbide mononitrate 30 MG 24 hr tablet Commonly known as:  IMDUR Take 0.5 tablets (15 mg total) by mouth daily.   mirtazapine 7.5 MG tablet Commonly known as:  REMERON Take 7.5 mg by mouth at bedtime.   nicotine 14 mg/24hr patch Commonly known as:  NICODERM CQ - dosed in mg/24 hours Place 1 patch (14 mg total) onto the skin daily.   pantoprazole 40 MG tablet Commonly known as:  PROTONIX Take 1 tablet (40 mg total) by mouth 2 (two) times daily. What changed:  when to take this   polyethylene glycol packet Commonly known as:   MIRALAX / GLYCOLAX Take 17 g by mouth daily as needed for mild constipation.   spironolactone 25 MG tablet Commonly known as:  ALDACTONE Take 1 tablet (25 mg total) by mouth daily.   tamsulosin 0.4 MG Caps capsule Commonly known as:  FLOMAX Take 1 capsule (0.4 mg total) by mouth daily.   Vitamin D3 5000 units Tabs Take 5,000 Units by mouth daily.        Contact information for follow-up providers    [facility].   Why:  Home Health Services  Contact information: [place] [address] [phone]        [facility] Follow up.   Why:  Palliative Care Services Contact information: [place] [phone]       [facility]. Schedule an appointment as soon as possible for a visit in 3 week(s).   Specialty:  Gastroenterology Why:  Pancreatitis, upper GI bleed, pseudocyst Contact information: [place] [address] [phone]       Nahser, Wonda Cheng, MD. Schedule an appointment as soon as possible for a visit in 2 week(s).   Specialty:  Cardiology Why:  Outpatient stress test Contact information: [address] [place] [address] [phone]        Waynetta Sandy, MD. Schedule an appointment as  soon as possible for a visit in 1 week(s).   Specialties:  Vascular Surgery, Cardiology Why:  PAD status post recent stent Contact information: [place] [address] [phone]            Contact information for after-discharge care    Destination    [facility] .   Service:  Skilled Nursing Contact information: [address] [place] [phone]                  Major procedures and Radiology Reports - PLEASE review detailed and final reports thoroughly  -     Echo; Normal LVF with  mild BSH, mild MR and TR, mild LVE, grade 3 diastolic dysfunction.. S/P mitral valve repair with moderate MS. No significant change from prior echo The right ventricular systolic pressure was increased consistent with mild pulmonaryhypertension.   Ct Abdomen Pelvis Wo Contrast  Result Date: 05/28/2018 CLINICAL DATA:  New tiny right lung nodule on chest radiograph. Severe right lower quadrant abdominal pain with nausea, vomiting and diarrhea. Chronic kidney disease. Prior cholecystectomy 04/19/2018, hysterectomy and appendectomy. Prior bowel resection. EXAM: CT CHEST, ABDOMEN AND PELVIS WITHOUT CONTRAST TECHNIQUE: Multidetector CT imaging of the chest, abdomen and pelvis was performed following the standard protocol without IV contrast. COMPARISON:  Chest radiograph from earlier today. FINDINGS: CT CHEST FINDINGS Cardiovascular: Borderline mild cardiomegaly. No significant pericardial effusion/thickening. 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 Atherosclerotic nonaneurysmal thoracic aorta. Proximal left subclavian artery stent is noted. Dilated main pulmonary artery (4.0 cm diameter). Mediastinum/Nodes: No discrete thyroid nodules. Unremarkable esophagus. No axillary adenopathy. Mild right paratracheal adenopathy up to 1.1 cm (series 3/image 21). Mildly enlarged 1.1 cm subcarinal node (series 3/image 26). No discrete hilar adenopathy on this noncontrast scan. Lungs/Pleura: No pneumothorax. No pleural effusion. A few tiny calcified granulomas are present in the upper lobes bilaterally. No acute consolidative airspace disease, lung masses or significant pulmonary nodules. Diffuse interlobular septal thickening. Musculoskeletal: No aggressive appearing focal osseous lesions. Intact sternotomy wires. Moderate thoracic spondylosis. CT ABDOMEN PELVIS FINDINGS Hepatobiliary: Normal liver with no liver mass. Cholecystectomy. Bile ducts are within normal post cholecystectomy limits with CBD  diameter 7 mm. Pancreas: Thickening of the pancreatic head and neck. Prominent peripancreatic fat stranding and ill-defined fluid in the pancreatic head and neck, worsened. Poorly defined 5.1 x 2.5 cm anterior pancreatic head complex fluid collection with heterogeneous internal density (series 3/image 66), increased from 2.3 x 1.2 cm. No definite pancreatic duct dilation. Severe wall thickening throughout the adjacent descending duodenum, worsened. Fluid collection inferior to the gallbladder fossa measures 5.0 x 3.0 cm (series 3/image 71) and demonstrates layering hyperdense material, new. Spleen: Normal size. No mass. Adrenals/Urinary Tract: Normal adrenals. No hydronephrosis. No renal stones. Partially exophytic simple 1.3 cm posterior interpolar left renal cyst. No additional contour deforming renal lesions. Normal bladder. Stomach/Bowel: Normal non-distended stomach. Normal caliber small bowel with no small bowel wall thickening. Appendectomy. New segmental wall thickening in the hepatic flexure of the colon. No additional sites of large bowel wall thickening. Stable postsurgical changes from distal large bowel resection. Vascular/Lymphatic: Atherosclerotic abdominal aorta with stable 5.0 cm infrarenal abdominal aortic aneurysm status post aorto bi-iliac stent graft repair. Surgical clips are noted in the right inguinal region. No pathologically enlarged lymph nodes in the abdomen or pelvis. Reproductive: Status post hysterectomy, with no abnormal findings at the vaginal cuff. No adnexal mass. Other: No pneumoperitoneum.  New small  volume perihepatic ascites. Musculoskeletal: No aggressive appearing focal osseous lesions. Mild lumbar spondylosis. IMPRESSION: CT CHEST: 1. No lung masses or significant pulmonary nodules. 2. Borderline mild cardiomegaly. Diffuse interlobular septal thickening suggests mild pulmonary edema. No pleural effusions. 3. Dilated main pulmonary artery, suggesting pulmonary arterial  hypertension. CT ABDOMEN PELVIS: 1. Worsening severe inflammatory changes surrounding the pancreatic head and neck, descending duodenum and hepatic flexure of the colon. Enlarging nonspecific complex poorly defined fluid collection in the anterior pancreatic head. New complex fluid collection inferior to the gallbladder fossa with layering hyperdense material. Burtis Junes worsening pancreatitis with enlarging pancreatic pseudocysts. Marked wall thickening in the descending duodenum has worsened, and could be reactive, with peptic ulcer disease difficult to exclude. No free air. Short-term follow-up MRI or CT abdomen with IV contrast suggested. 2. New wall thickening throughout the hepatic flexure of the colon, probably reactive. 3. Cholecystectomy. No intrahepatic biliary ductal dilatation. CBD diameter 7 mm, within normal post cholecystectomy limits. 4. New small volume perihepatic ascites. 5.  Aortic Atherosclerosis (ICD10-I70.0). Electronically Signed   By: Ilona Sorrel M.D.   On: 05/28/2018 20:41   Dg Elbow Complete Left (3+view)  Result Date: 06/26/2018 CLINICAL DATA:  Infected soft tissue wound EXAM: LEFT ELBOW - COMPLETE 3+ VIEW COMPARISON:  None. FINDINGS: Frontal, lateral, and bilateral oblique views were obtained. There is generalized soft tissue swelling. No soft tissue air or radiopaque foreign body evident. No fracture or dislocation. No appreciable joint effusion. No appreciable joint space narrowing or erosion. No bony destruction evident. IMPRESSION: Diffuse soft tissue swelling without soft tissue air or radiopaque foreign body. No bony destruction or erosion. No fracture or dislocation. Electronically Signed   By: Lowella Grip III M.D.   On: 06/26/2018 07:52   Ct Chest Wo Contrast  Result Date: 05/28/2018 CLINICAL DATA:  New tiny right lung nodule on chest radiograph. Severe right lower quadrant abdominal pain with nausea, vomiting and diarrhea. Chronic kidney disease. Prior cholecystectomy  04/19/2018, hysterectomy and appendectomy. Prior bowel resection. EXAM: CT CHEST, ABDOMEN AND PELVIS WITHOUT CONTRAST TECHNIQUE: Multidetector CT imaging of the chest, abdomen and pelvis was performed following the standard protocol without IV contrast. COMPARISON:  Chest radiograph from earlier today. FINDINGS: CT CHEST FINDINGS Cardiovascular: Borderline mild cardiomegaly. No significant pericardial effusion/thickening. 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 Atherosclerotic nonaneurysmal thoracic aorta. Proximal left subclavian artery stent is noted. Dilated main pulmonary artery (4.0 cm diameter). Mediastinum/Nodes: No discrete thyroid nodules. Unremarkable esophagus. No axillary adenopathy. Mild right paratracheal adenopathy up to 1.1 cm (series 3/image 21). Mildly enlarged 1.1 cm subcarinal node (series 3/image 26). No discrete hilar adenopathy on this noncontrast scan. Lungs/Pleura: No pneumothorax. No pleural effusion. A few tiny calcified granulomas are present in the upper lobes bilaterally. No acute consolidative airspace disease, lung masses or significant pulmonary nodules. Diffuse interlobular septal thickening. Musculoskeletal: No aggressive appearing focal osseous lesions. Intact sternotomy wires. Moderate thoracic spondylosis. CT ABDOMEN PELVIS FINDINGS Hepatobiliary: Normal liver with no liver mass. Cholecystectomy. Bile ducts are within normal post cholecystectomy limits with CBD diameter 7 mm. Pancreas: Thickening of the pancreatic head and neck. Prominent peripancreatic fat stranding and ill-defined fluid in the pancreatic head and neck, worsened. Poorly defined 5.1 x 2.5 cm anterior pancreatic head complex fluid collection with heterogeneous internal density (series 3/image 66), increased from 2.3 x 1.2 cm. No definite pancreatic duct dilation. Severe wall thickening throughout the adjacent descending duodenum, worsened. Fluid collection inferior to the gallbladder  fossa measures 5.0 x 3.0 cm (  series 3/image 71) and demonstrates layering hyperdense material, new. Spleen: Normal size. No mass. Adrenals/Urinary Tract: Normal adrenals. No hydronephrosis. No renal stones. Partially exophytic simple 1.3 cm posterior interpolar left renal cyst. No additional contour deforming renal lesions. Normal bladder. Stomach/Bowel: Normal non-distended stomach. Normal caliber small bowel with no small bowel wall thickening. Appendectomy. New segmental wall thickening in the hepatic flexure of the colon. No additional sites of large bowel wall thickening. Stable postsurgical changes from distal large bowel resection. Vascular/Lymphatic: Atherosclerotic abdominal aorta with stable 5.0 cm infrarenal abdominal aortic aneurysm status post aorto bi-iliac stent graft repair. Surgical clips are noted in the right inguinal region. No pathologically enlarged lymph nodes in the abdomen or pelvis. Reproductive: Status post hysterectomy, with no abnormal findings at the vaginal cuff. No adnexal mass. Other: No pneumoperitoneum.  New small volume perihepatic ascites. Musculoskeletal: No aggressive appearing focal osseous lesions. Mild lumbar spondylosis. IMPRESSION: CT CHEST: 1. No lung masses or significant pulmonary nodules. 2. Borderline mild cardiomegaly. Diffuse interlobular septal thickening suggests mild pulmonary edema. No pleural effusions. 3. Dilated main pulmonary artery, suggesting pulmonary arterial hypertension. CT ABDOMEN PELVIS: 1. Worsening severe inflammatory changes surrounding the pancreatic head and neck, descending duodenum and hepatic flexure of the colon. Enlarging nonspecific complex poorly defined fluid collection in the anterior pancreatic head. New complex fluid collection inferior to the gallbladder fossa with layering hyperdense material. Burtis Junes worsening pancreatitis with enlarging pancreatic pseudocysts. Marked wall thickening in the descending duodenum has worsened, and could  be reactive, with peptic ulcer disease difficult to exclude. No free air. Short-term follow-up MRI or CT abdomen with IV contrast suggested. 2. New wall thickening throughout the hepatic flexure of the colon, probably reactive. 3. Cholecystectomy. No intrahepatic biliary ductal dilatation. CBD diameter 7 mm, within normal post cholecystectomy limits. 4. New small volume perihepatic ascites. 5.  Aortic Atherosclerosis (ICD10-I70.0). Electronically Signed   By: Ilona Sorrel M.D.   On: 05/28/2018 20:41   Mr Brain Wo Contrast  Result Date: 06/20/2018 CLINICAL DATA:  Confusion EXAM: MRI HEAD WITHOUT CONTRAST TECHNIQUE: Multiplanar, multiecho pulse sequences of the brain and surrounding structures were obtained without intravenous contrast. COMPARISON:  Head CT 04/09/2018.  Brain MRI 08/31/2006 FINDINGS: Brain: No acute infarction, hemorrhage, hydrocephalus, extra-axial collection or mass lesion. Mild patchy FLAIR hyperintensity in the cerebral white matter, usually from old microvascular insults. There are remote micro hemorrhages in the right cerebral hemisphere roughly aligned along the watershed. No associated gliosis is seen. Patient has a history of endarterectomy, but this was left-sided. Unilaterally and lack of significant white matter disease argues against amyloid angiopathy. Cerebral volume loss since 2007, but mild for age. Vascular: Major flow voids are preserved Skull and upper cervical spine: No evidence of marrow lesion Sinuses/Orbits: Right cataract resection IMPRESSION: No acute or reversible finding. No specific explanation for confusion. Electronically Signed   By: Monte Fantasia M.D.   On: 06/20/2018 09:29   US Renal  Result Date: 06/08/2018 CLINICAL DATA:  Renal failure EXAM: RENAL / URINARY TRACT ULTRASOUND COMPLETE COMPARISON:  None. FINDINGS: Right Kidney: Length: 11.0 cm. Cortical thinning. Echogenicity within normal limits. No hydronephrosis. Left Kidney: Length: 11.3 cm. Cortical  thinning. Echogenicity within normal limits. 14 x 11 x 14 mm lower pole cyst. No hydronephrosis. Bladder: Within normal limits. IMPRESSION: 14 mm left lower pole renal cyst.  No hydronephrosis. Electronically Signed   By: Julian Hy M.D.   On: 06/08/2018 01:11   Dg Chest Port 1 View  Result Date: 06/17/2018 CLINICAL DATA:  Leukocytosis EXAM: PORTABLE CHEST 1 VIEW COMPARISON:  06/15/2018 FINDINGS: Cardiac shadow remains mildly enlarged. Lungs are well aerated bilaterally. The degree of vascular congestion has improved in the interval. No new focal infiltrate is noted. No bony abnormality is seen. IMPRESSION: Improved vascular congestion when compare with the prior exam. Electronically Signed   By: Inez Catalina M.D.   On: 06/17/2018 13:43   Dg Chest Port 1 View  Result Date: 06/15/2018 CLINICAL DATA:  Shortness of breath EXAM: PORTABLE CHEST 1 VIEW COMPARISON:  05/28/2018 FINDINGS: Cardiac shadow is mildly prominent. Postsurgical changes are again seen. Feeding catheter is noted extending into the stomach. The lungs are well aerated bilaterally. Mild central vascular congestion with interstitial edema is seen. No sizable effusion is noted. IMPRESSION: Changes of mild CHF. Electronically Signed   By: Inez Catalina M.D.   On: 06/15/2018 14:12   Dg Chest Portable 1 View  Result Date: 05/28/2018 CLINICAL DATA:  Severe right lower quadrant pain. EXAM: PORTABLE CHEST 1 VIEW COMPARISON:  04/18/2018 FINDINGS: The lungs are clear without focal pneumonia, edema, pneumothorax or pleural effusion. Interstitial markings are diffusely coarsened with chronic features. Tiny nodular density identified right upper lung, superimposed on anterior right second rib. Patient is status post median sternotomy. Left atrial appendage occluded device noted. Vascular stent superimposed on the transverse aorta. The visualized bony structures of the thorax are intact. IMPRESSION: Cardiomegaly with mild vascular congestion or  chronic interstitial coarsening. Tiny nodule right upper lung not visible on prior studies and indeterminate. CT chest without contrast could be used to further evaluate. Electronically Signed   By: Misty Stanley M.D.   On: 05/28/2018 17:23   Dg Abd 2 Views  Result Date: 05/30/2018 CLINICAL DATA:  History of pancreatitis.  Abdominal pain. EXAM: ABDOMEN - 2 VIEW COMPARISON:  Chest radiograph 05/29/2018 FINDINGS: Feeding tube tip projects at the gastric antrum/proximal duodenum, unchanged. Aortic stent graft material. Monitoring leads overlie the patient. Gas is demonstrated within nondilated loops of large and small bowel. Small left pleural effusion. Heterogeneous opacities left lung base. IMPRESSION: Feeding tube projects at the gastric antrum/proximal duodenum, unchanged. Decreased gaseous distention of the stomach and small bowel. Electronically Signed   By: Lovey Newcomer M.D.   On: 05/30/2018 10:49   Dg Abd Portable 1v  Result Date: 06/15/2018 CLINICAL DATA:  Vomiting. EXAM: PORTABLE ABDOMEN - 1 VIEW COMPARISON:  Abdominal x-ray dated June 09, 2018. FINDINGS: Feeding tube tip has slightly retracted, now in the distal second portion of the duodenum. The bowel gas pattern is normal. Unchanged aorto bi-iliac stent graft and left renal artery stent. No acute osseous abnormality. IMPRESSION: 1. Slight retraction of the feeding tube with the tip now in the distal second portion of the duodenum. 2. Nonobstructive bowel gas pattern. Electronically Signed   By: Titus Dubin M.D.   On: 06/15/2018 11:21   Dg Abd Portable 1v  Result Date: 06/09/2018 CLINICAL DATA:  Feeding tube placement EXAM: PORTABLE ABDOMEN - 1 VIEW COMPARISON:  Plain film of the abdomen from earlier same day. FINDINGS: Weighted tip feeding tube has now progressed into the proximal small bowel with tip well-positioned in the expected region of the ligament of Treitz. The mildly distended gas-filled loops of small bowel are again noted  within the LEFT abdomen, unchanged in the short-term interval. No evidence of soft tissue mass or abnormal fluid collection. No evidence of free intraperitoneal air. Cholecystectomy clips in the RIGHT upper quadrant. Aorta bi-iliac stents in place. No acute or suspicious  osseous finding. IMPRESSION: Weighted tip feeding tube has progressed into the proximal small bowel, with tip now well positioned at the expected region of the ligament of Treitz. Mildly prominent gas-filled loops of small bowel within the LEFT abdomen, similar to the plain film from earlier today, suggesting partial small bowel obstruction or ileus. Electronically Signed   By: Franki Cabot M.D.   On: 06/09/2018 13:23   Dg Abd Portable 1v  Result Date: 06/09/2018 CLINICAL DATA:  Nasogastric tube placement. EXAM: PORTABLE ABDOMEN - 1 VIEW COMPARISON:  Radiographs of May 30, 2018. FINDINGS: Mildly dilated small bowel loops are noted concerning for distal small bowel obstruction or ileus. Distal tip of feeding tube is seen in expected position of distal stomach. Status post cholecystectomy. Phleboliths are noted in the pelvis. Status post stent graft repair of abdominal aortic aneurysm. IMPRESSION: Distal tip of feeding tube seen in expected position of distal stomach. Mildly dilated small bowel loops are noted concerning for distal small bowel obstruction or ileus. Electronically Signed   By: Marijo Conception, M.D.   On: 06/09/2018 11:39   Dg Abd Portable 1v  Result Date: 05/29/2018 CLINICAL DATA:  Feeding tube placement EXAM: PORTABLE ABDOMEN - 1 VIEW COMPARISON:  None. FINDINGS: Gaseous distension of the stomach and proximal small bowel. Feeding tube with the tip projecting over antrum versus proximal duodenum. No evidence of pneumoperitoneum, portal venous gas or pneumatosis. No pathologic calcifications along the expected course of the ureters. Aorto bi-iliac stent graft is noted. Left renal artery stent is noted. Osseous structures  are unremarkable. IMPRESSION: Gaseous distension of the stomach and proximal small bowel. Feeding tube with the tip projecting over antrum versus proximal duodenum. Electronically Signed   By: Kathreen Devoid   On: 05/29/2018 15:30    Micro Results     No results found for this or any previous visit (from the past 240 hour(s)).  Today   Subjective    [name] today has no headache,no chest abdominal pain,no new weakness tingling or numbness, feels much better, appears to be in no discomfort but still complaining of chronic abdominal pain.   Objective   Blood pressure (!) 173/46, pulse 63, temperature 98.4 F (36.9 C), resp. rate 18, height 5\' 5"  (1.651 m), weight 70.6 kg, SpO2 98 %.   Intake/Output Summary (Last 24 hours) at 06/26/2018 0820 Last data filed at 06/26/2018 0028 Gross per 24 hour  Intake -  Output 2500 ml  Net -2500 ml    Exam Awake Alert, Oriented x 3, No new F.N deficits, Normal affect [place].AT,PERRAL Supple Neck,No JVD, No cervical lymphadenopathy appriciated.  Symmetrical Chest wall movement, Good air movement bilaterally, CTAB RRR,No Gallops,Rubs or new Murmurs, No Parasternal Heave +ve B.Sounds, Abd Soft, Non tender, No organomegaly appriciated, No rebound -guarding or rigidity. No Cyanosis, Clubbing, edema almost completely resolved, upper extremity edema left more than right   Data Review   CBC w Diff:  Lab Results  Component Value Date   WBC 9.5 06/24/2018   HGB 8.3 (L) 06/24/2018   HCT 27.4 (L) 06/24/2018   HCT 24.3 (L) 05/31/2018   PLT 318 06/24/2018   LYMPHOPCT 2 05/29/2018   MONOPCT 5 05/29/2018   EOSPCT 1 05/29/2018   BASOPCT 0 05/29/2018    CMP:  Lab Results  Component Value Date   NA 140 06/25/2018   K 4.2 06/26/2018   CL 98 06/25/2018   CO2 33 (H) 06/25/2018   BUN 83 (H) 06/25/2018   CREATININE 1.94 (  H) 06/25/2018   CREATININE 1.32 (H) 06/17/2015   PROT 5.2 (L) 06/12/2018   ALBUMIN 2.1 (L) 06/12/2018   BILITOT 1.0 06/12/2018    ALKPHOS 35 (L) 06/12/2018   AST 19 06/12/2018   ALT 9 06/12/2018  .   Total Time in preparing paper work, data evaluation and todays exam - 48 minutes  Lala Lund M.D on 06/26/2018 at 8:20 AM  [facility]   Office  [phone]

## 2018-06-26 NOTE — Progress Notes (Signed)
PT Cancellation Note  Patient Details Name: [name] MRN: [id number] DOB: February 12, 1951   Cancelled Treatment:    Reason Eval/Treat Not Completed: Other (comment).  EMT's have arrived to take her to rehab, and will try again tomorrow if for some reason she does not DC.   Ramond Dial 06/26/2018, 12:17 PM   [name], PT MS Acute Rehab Dept. Number: [place]

## 2018-06-26 NOTE — Discharge Instructions (Signed)
Follow with Primary MD Bernerd Limbo, MD in 7 days   Get CBC, CMP, 2 view Chest X ray -  checked  by Primary MD or SNF MD in 3 days    Activity: As tolerated with Full fall precautions use walker/cane & assistance as needed  Disposition SNF  Diet: Soft heart healthy diet with feeding assistance and aspiration precautions.  1.5 L/day total fluid restriction   Special Instructions: If you have smoked or chewed Tobacco  in the last 2 yrs please stop smoking, stop any regular Alcohol  and or any Recreational drug use.  On your next visit with your primary care physician please Get Medicines reviewed and adjusted.  Please request your Prim.MD to go over all Hospital Tests and Procedure/Radiological results at the follow up, please get all Hospital records sent to your Prim MD by signing hospital release before you go home.  If you experience worsening of your admission symptoms, develop shortness of breath, life threatening emergency, suicidal or homicidal thoughts you must seek medical attention immediately by calling 911 or calling your MD immediately  if symptoms less severe.  You Must read complete instructions/literature along with all the possible adverse reactions/side effects for all the Medicines you take and that have been prescribed to you. Take any new Medicines after you have completely understood and accpet all the possible adverse reactions/side effects.

## 2018-06-26 NOTE — Progress Notes (Signed)
Patient will DC to: [facility] Anticipated DC date: 06/26/18 Family notified: Daughter, Financial planner by: PTAR 11:30am   Per MD patient ready for DC to [facility]. RN, patient, patient's family, and facility notified of DC. Discharge Summary and FL2 sent to facility. RN to call report prior to discharge [phone]). DC packet on chart. Ambulance transport requested for patient.   CSW will sign off for now as social work intervention is no longer needed. Please consult [place] again if new needs arise.  Cedric Fishman, LCSW Clinical Social Worker [phone]

## 2018-06-26 NOTE — Progress Notes (Signed)
Occupational Therapy Treatment Patient Details Name: [name] MRN: [id number] DOB: 1950/10/27 Today's Date: 06/26/2018   Clinical Impression: Pt progressing towards OT goals this session. Since Pt is set to dc to SNF today, session limited to bed level grooming, and HEP for BUE (Pt reports edema has improved). Current POC remains effective and appropriate. Thank you for the opportunity to serve this patient.    06/26/18 1100  OT Visit Information  Last OT Received On 06/26/18  Assistance Needed +2  History of Present Illness Pt is a 67 y/o female with c/o of abdominal pain. Note recent hospitalization 04/09/18 to 04/21/18 with GI bleed, gallstone pancreatitis, and during which she received laparoscopic cholecystectomy. Diagnosed with recurrent pancreatitis. PMH AAA, CHF, chronic back pain, CKD, COPD, GI bleed, HTN, MI, A-fib, DM, cardiac cath, CABG, maze procedure, mitral valve repair, R femoral-popliteal bypass.  Pt's course was complicated by hypotension and vomiting blood.  She had a significant drop in Hgb into the 5s.  She required several units of PRBCs and was transferred to the ICU.  She underwent an EGD on 06/16/18 which revealed and 8 mm esophageal ulcer.    Precautions  Precautions Fall;Other (comment)  Precaution Comments watch SPO2/HR  Pain Assessment  Pain Assessment Faces  Faces Pain Scale 4  Pain Location bottom  Pain Descriptors / Indicators Sore  Pain Intervention(s) Limited activity within patient's tolerance;Monitored during session  Cognition  Arousal/Alertness Awake/alert  Behavior During Therapy Flat affect;WFL for tasks assessed/performed  Overall Cognitive Status Within Functional Limits for tasks assessed  Upper Extremity Assessment  Upper Extremity Assessment Generalized weakness (Pt reports that edema has decreased significantly)  ADL  Overall ADL's  Needs assistance/impaired  Grooming Wash/dry face;Oral care;Set up;Bed level  Grooming Details (indicate  cue type and reason) bed level inbetween exercises  General ADL Comments focus on BUE exercises today and reinforced use of   Bed Mobility  General bed mobility comments NT this session  Restrictions  Weight Bearing Restrictions No  Transfers  General transfer comment NT this session  Total Joint Exercises  Towel Squeeze AROM;Both;10 reps;Supine  General Exercises - Upper Extremity  Digit Composite Flexion AROM;Both;10 reps;Supine  Composite Extension AROM;Both;10 reps;Supine  Shoulder Flexion AROM;AAROM;Left;Right;10 reps;Supine  Elbow Flexion AROM;Right;Left;10 reps;Supine  Elbow Extension AROM;Right;Left;10 reps;Supine  OT - End of Session  Equipment Utilized During Treatment Oxygen  Activity Tolerance Patient tolerated treatment well  Patient left in bed;with call bell/phone within reach;with bed alarm set;with SCD's reapplied  Nurse Communication Mobility status  OT Assessment/Plan  OT Plan Discharge plan remains appropriate;Frequency remains appropriate  OT Visit Diagnosis Muscle weakness (generalized) (M62.81)  OT Frequency (ACUTE ONLY) Min 2X/week  Follow Up Recommendations CIR;Supervision/Assistance - 24 hour  OT Equipment None recommended by OT  AM-PAC OT "6 Clicks" Daily Activity Outcome Measure  Help from another person eating meals? 3  Help from another person taking care of personal grooming? 3  Help from another person toileting, which includes using toliet, bedpan, or urinal? 2  Help from another person bathing (including washing, rinsing, drying)? 2  Help from another person to put on and taking off regular upper body clothing? 2  Help from another person to put on and taking off regular lower body clothing? 2  6 Click Score 14  ADL G Code Conversion CK  OT Goal Progression  Progress towards OT goals Progressing toward goals  Acute Rehab OT Goals  Patient Stated Goal Hopes to be able to go home  OT Goal Formulation  With patient  Time For Goal Achievement  07/01/18  Potential to Achieve Goals Good  OT Time Calculation  OT Start Time (ACUTE ONLY) 0912  OT Stop Time (ACUTE ONLY) 0932  OT Time Calculation (min) 20 min  OT General Charges  $OT Visit 1 Visit  OT Treatments  $Therapeutic Exercise 8-22 mins   Hulda Humphrey OTR/L [place] Pager: [phone] Office: [phone]

## 2018-06-26 NOTE — Care Management Note (Signed)
Case Management Note  Patient Details  Name: [name] MRN: [id number] Date of Birth: September 26, 1950  Subjective/Objective:        Abd pain/Pancreatitis. Hx of  hypertension, hyperlipidemia, diet-controlled diabetes, COPD on home oxygen, GERD, gout, depression with anxiety, tobacco abuse, atrial fibrillation not on anticoagulants, AAA, CAD, CABG, CHF, CKD-4, GI bleeding, mitral valve regurgitation, PAD. Recent admit  8/4-8/16 2/2  GI bleeding and gallstone pancreatitis, s/p  laparoscopic cholecystectomy.  Ronnette Juniper (Daughter) Kirke Shaggy (Daughter)    [phone]     PCP: Bernerd Limbo  Action/Plan: Pt/family agreeable to SNF. CSW managing disposition to facility.  Expected Discharge Date:  06/26/18               Expected Discharge Plan:  Skilled Nursing Facility  In-House Referral:  Clinical Social Work  Discharge planning Services  CM Consult  Post Acute Care Choice:  Durable Medical Equipment, Home Health Choice offered to:  Patient, Adult Children  DME Arranged:  N/A DME Agency:  NA  HH Arranged:  N/A HH Agency:  NA  Status of Service:  Completed, signed off  If discussed at [place] of Stay Meetings, dates discussed:    Additional Comments:  Sharin Mons, RN 06/26/2018, 10:09 AM

## 2018-06-26 NOTE — Clinical Social Work Placement (Signed)
   CLINICAL SOCIAL WORK PLACEMENT  NOTE  Date:  06/26/2018  Patient Details  Name: [name] MRN: [id number] Date of Birth: [birth year]  Clinical Social Work is seeking post-discharge placement for this patient at the [place] level of care (*CSW will initial, date and re-position this form in  chart as items are completed):  Yes   Patient/family provided with [place] Work Department's list of facilities offering this level of care within the geographic area requested by the patient (or if unable, by the patient's family).  Yes   Patient/family informed of their freedom to choose among providers that offer the needed level of care, that participate in Medicare, Medicaid or managed care program needed by the patient, have an available bed and are willing to accept the patient.  Yes   Patient/family informed of [facility]'s ownership interest in [facility] and [facility], as well as of the fact that they are under no obligation to receive care at these facilities.  PASRR submitted to EDS on       PASRR number received on       Existing PASRR number confirmed on 06/19/18     FL2 transmitted to all facilities in geographic area requested by pt/family on 06/19/18     FL2 transmitted to all facilities within larger geographic area on       Patient informed that his/her managed care company has contracts with or will negotiate with certain facilities, including the following:        Yes   Patient/family informed of bed offers received.  [name]     Physician recommends and patient chooses bed at      [name] on 06/26/18.  Patient to be transferred to facility by PTAR     Patient family notified on 06/26/18 of transfer.  Name of family member notified:  Daughter-Shannon     PHYSICIAN       Additional Comment:     _______________________________________________ Benard Halsted, LCSW 06/26/2018, 10:39 AM

## 2018-06-26 NOTE — Progress Notes (Signed)
CSW received call from [facility] representative. She inquired as to why patient had not discharged yet. CSW explained that [facility] had stated that they lost authorization on Friday and needed a new authorization. She stated that was not true and that I could go ahead and send the patient today. [facility] updated.   Percell Locus Pama Roskos LCSW [phone]

## 2018-06-27 ENCOUNTER — Encounter: Payer: Self-pay | Admitting: Internal Medicine

## 2018-06-27 ENCOUNTER — Non-Acute Institutional Stay (SKILLED_NURSING_FACILITY): Payer: Medicare Other | Admitting: Internal Medicine

## 2018-06-27 DIAGNOSIS — K8591 Acute pancreatitis with uninfected necrosis, unspecified: Secondary | ICD-10-CM | POA: Diagnosis not present

## 2018-06-27 DIAGNOSIS — T82330D Leakage of aortic (bifurcation) graft (replacement), subsequent encounter: Secondary | ICD-10-CM

## 2018-06-27 DIAGNOSIS — E43 Unspecified severe protein-calorie malnutrition: Secondary | ICD-10-CM

## 2018-06-27 DIAGNOSIS — IMO0002 Reserved for concepts with insufficient information to code with codable children: Secondary | ICD-10-CM

## 2018-06-27 DIAGNOSIS — N179 Acute kidney failure, unspecified: Secondary | ICD-10-CM

## 2018-06-27 DIAGNOSIS — R339 Retention of urine, unspecified: Secondary | ICD-10-CM

## 2018-06-27 DIAGNOSIS — IMO0001 Reserved for inherently not codable concepts without codable children: Secondary | ICD-10-CM

## 2018-06-27 DIAGNOSIS — R601 Generalized edema: Secondary | ICD-10-CM

## 2018-06-27 DIAGNOSIS — Z95828 Presence of other vascular implants and grafts: Secondary | ICD-10-CM

## 2018-06-27 DIAGNOSIS — I480 Paroxysmal atrial fibrillation: Secondary | ICD-10-CM

## 2018-06-27 DIAGNOSIS — K920 Hematemesis: Secondary | ICD-10-CM

## 2018-06-27 DIAGNOSIS — I714 Abdominal aortic aneurysm, without rupture, unspecified: Secondary | ICD-10-CM

## 2018-06-27 DIAGNOSIS — D62 Acute posthemorrhagic anemia: Secondary | ICD-10-CM

## 2018-06-27 DIAGNOSIS — I739 Peripheral vascular disease, unspecified: Secondary | ICD-10-CM

## 2018-06-27 DIAGNOSIS — D500 Iron deficiency anemia secondary to blood loss (chronic): Secondary | ICD-10-CM

## 2018-06-27 DIAGNOSIS — I25710 Atherosclerosis of autologous vein coronary artery bypass graft(s) with unstable angina pectoris: Secondary | ICD-10-CM

## 2018-06-27 DIAGNOSIS — N184 Chronic kidney disease, stage 4 (severe): Secondary | ICD-10-CM

## 2018-06-27 DIAGNOSIS — E8771 Transfusion associated circulatory overload: Secondary | ICD-10-CM

## 2018-06-27 DIAGNOSIS — K859 Acute pancreatitis without necrosis or infection, unspecified: Secondary | ICD-10-CM | POA: Diagnosis not present

## 2018-06-27 DIAGNOSIS — K863 Pseudocyst of pancreas: Secondary | ICD-10-CM

## 2018-06-27 NOTE — Telephone Encounter (Signed)
  Left message on machine to call back    You have been scheduled for a CT scan of the abdomen and pelvis at [facility] ([address]---this is in the same building as [facility]).   You are scheduled on 07/14/18 at 2 pm. You should arrive 15 minutes prior to your appointment time for registration. Please follow the written instructions below on the day of your exam:    This test typically takes 30-45 minutes to complete.  If you have any questions regarding your exam or if you need to reschedule, you may call the CT department at [phone] between the hours of 8:00 am and 5:00 pm, Monday-Friday.  ____________________________________________

## 2018-06-27 NOTE — Progress Notes (Signed)
:   Location:  [place] Room Number: 710G Place of Service:  SNF (31)  Amy Penniman D. [name], MD  Patient Care Team: Bernerd Limbo, MD as PCP - General (Family Medicine) Sherren Mocha, MD as PCP - Cardiology (Cardiology) Sherren Mocha, MD as Consulting Physician (Cardiology) Mal Misty, MD as Consulting Physician (Vascular Surgery) Sharmon Revere as Physician Assistant (Cardiology) Elmarie Shiley, MD as Consulting Physician (Nephrology)  Extended Emergency Contact Information Primary Emergency Contact: [facility] Address: [place], [place] [address] Johnnette Litter of [place] Phone: [phone] Relation: Daughter Secondary Emergency Contact: Tyler,Shannon Address: [address]          [place], [place] [address] Johnnette Litter of [place] Mobile Phone: [phone] Relation: Daughter     Allergies: Isosorbide; Isosorbide nitrate; Oxycodone-acetaminophen; Codeine; Contrast media [iodinated diagnostic agents]; Ioxaglate; Metrizamide; and Percocet [oxycodone-acetaminophen]  Chief Complaint  Patient presents with  . New Admit To SNF    Admit to [facility]    HPI: Patient is 67 y.o. female with hypertension, hyperlipidemia, diabetes mellitus type 2, COPD on home O2, GERD, gout, depression with anxiety, tobacco abuse, atrial fib not on anticoagulants, AAA, CAD, CABG, CHF, CKD-4 GI bleeding mitral valve regurg, PAD who presented to [facility] with abdominal pain.  Patient was recently hospitalized from 8/4-8 due to GI bleeding, requiring blood transfusion and gallstone pancreatitis.  Patient underwent laparoscopic cholecystectomy and was discharged in stable condition.  Patient presented with abdominal pain that had onset yesterday which was located on the right side of the abdomen constant, 8 out of 10 in severity sharp, nonradiating.  It was not aggravated or alleviated by any known fractures.  Associated with  nausea vomiting once.  And diarrhea, but patient is on Colace.  Patient denied fever or chills.  In the ED patient was sent to have a lipase of 399, troponin of 0.13, potassium 3.1, stable renal function, and WBC of 23.7, with tachycardia and O2 sat 90 to 98% on 2 L nasal cannula and an elevated blood pressure of 211/107 CT chest did not show any mass or significant nodule.  Patient was admitted to [facility] from 9/22-10/21 where CT of abdomen and pelvis showed recurrent pancreatitis.  Patient also had an ileus acute kidney injury and anasarca.  Patient also developed hematemesis, GI bleed, and acute blood loss anemia in the setting of esophageal ulcers related to tube feeding.  Patient is hemoglobin dropped to 5 and she became hypotensive.  She received an total 5 units PRBC.  She became very edematous and was started on oral Lasix by nephrology.  Family on 10/15 had concern with patient's hands being weak with confusion and tremors with blank staring episodes MRI was negative for stroke EEG was negative for seizures patient also had urinary retention with the placement of a Foley and then subsequently a failed voiding trial.  Once patient was stable patient is admitted to skilled nursing facility for OT/PT.  While at skilled nursing facility patient will be followed for hypertension treated with Coreg Norvasc, hydralazine and indoor, chronic atrial fib treated with Coreg but not on anticoagulant due to recent GI bleed, and hyperlipidemia treated with Lipitor.  Past Medical History:  Diagnosis Date  . AAA (abdominal aortic aneurysm) ([place]) 5/09   3.7x3.3 by u/s 2009  . Abnormal EKG    deep TW inversions chronic  . Anemia   . CAD (  coronary artery disease)    a. CABG 03/2011 LIMA to LAD, SVG to OM, SVG to PDA. b. cath 06/25/2015 DES to SVG to rPDA, patent LIMA to LAD, patent SVG to OM  . carotid stenosis 5/09   S/p L CEA ;  60-79% bilat ICA by preCABG dopplers 7/12  . CHF (congestive heart  failure) ([place])   . Chronic back pain    "all over" (06/24/2015)  . Chronic bronchitis ([place])    "get it pretty much q yr" (06/24/2015)  . CKD (chronic kidney disease)   . Complication of anesthesia 1966   "problem w/ether"  . COPD (chronic obstructive pulmonary disease) ([place])   . Depression   . GERD (gastroesophageal reflux disease)    With hiatal hernia  . GIB (gastrointestinal bleeding) 9/11   S/p EGD with cautery at [facility]  . Heart murmur   . History of blood transfusion "a few times"   "all related to anemia" (06/24/2015)  . History of hiatal hernia   . Hyperlipidemia   . Hypertension   . Mitral regurgitation    3+ MR by intraoperative TEE;  s/p MV repair with Dr. Roxy Manns 7/12  . Myocardial infarction [facility]) 2012 "several"  . On home oxygen therapy    "2 liters at night; negative for sleep apnea"  . PAD (peripheral artery disease) (HCC)    Severe; s/p bilateral renal artery stents, moderate in-stent restenosis  . Pancreatitis 05/2018  . Paroxysmal atrial fibrillation (H[facility])    coumadin;  echo 9/07: EF 60%, mild LVH;  s/p Cox Maze 7/12 with LAA clipping  . Subclavian artery stenosis, left (H[facility])    stented by Dr. Trula Slade on 10/18 to help flow of her LIMA to LAD  . Type II diabetes mellitus ([place])     Past Surgical History:  Procedure Laterality Date  . A/V FISTULAGRAM Left 03/21/2018   Procedure: A/V FISTULAGRAM;  Surgeon: Serafina Mitchell, MD;  Location: [place] CV LAB;  Service: Cardiovascular;  Laterality: Left;  . ABDOMINAL AORTIC ENDOVASCULAR STENT GRAFT N/A 01/17/2018   Procedure: ABDOMINAL AORTIC ENDOVASCULAR STENT GRAFT WITH CO2;  Surgeon: Serafina Mitchell, MD;  Location: [place];  Service: Vascular;  Laterality: N/A;  . ABDOMINAL HYSTERECTOMY  1990's  . APPENDECTOMY  Aug. 11, 2016   Ruptured  . AV FISTULA PLACEMENT Left 11/03/2017   Procedure: ARTERIOVENOUS (AV) FISTULA CREATION LEFT ARM;  Surgeon: Serafina Mitchell, MD;  Location: [place];  Service: Vascular;  Laterality:  Left;  . BOWEL RESECTION  2013  . CARDIAC CATHETERIZATION N/A 06/24/2015   Procedure: Left Heart Cath and Cors/Grafts Angiography;  Surgeon: Leonie Man, MD;  Location: [place] CV LAB;  Service: Cardiovascular;  Laterality: N/A;  . CARDIAC CATHETERIZATION N/A 06/25/2015   Procedure: Coronary Stent Intervention;  Surgeon: Sherren Mocha, MD;  Location: [place] CV LAB;  Service: Cardiovascular;  Laterality: N/A;  . CAROTID ENDARTERECTOMY Left   . CATARACT EXTRACTION Right 03/27/2018  . CHOLECYSTECTOMY N/A 04/19/2018   Procedure: LAPAROSCOPIC CHOLECYSTECTOMY WITH INTRAOPERATIVE CHOLANGIOGRAM;  Surgeon: Rolm Bookbinder, MD;  Location: [place];  Service: General;  Laterality: N/A;  . CORONARY ANGIOPLASTY    . CORONARY ARTERY BYPASS GRAFT  03/05/2011   CABG X3 (LIMA to LAD, SVG to OM, SVG to PDA, EVH via left thigh  . CORONARY STENT PLACEMENT    . ESOPHAGOGASTRODUODENOSCOPY N/A 04/12/2018   Procedure: ESOPHAGOGASTRODUODENOSCOPY (EGD);  Surgeon: [name], MD;  Location: [facility] ENDOSCOPY;  Service: Endoscopy;  Laterality: N/A;  .  ESOPHAGOGASTRODUODENOSCOPY N/A 06/16/2018   Procedure: ESOPHAGOGASTRODUODENOSCOPY (EGD);  Surgeon: Thornton Park, MD;  Location: [facility];  Service: Gastroenterology;  Laterality: N/A;  . LACERATION REPAIR Right 1990's   WRIST  . MAZE  03/05/2011   complete biatrial lesion set with clipping of LA appendage  . MITRAL VALVE REPAIR  03/05/2011   62mm Memo 3D ring annuloplasty for ischemic MR  . PERIPHERAL VASCULAR BALLOON ANGIOPLASTY Left 03/21/2018   Procedure: PERIPHERAL VASCULAR BALLOON ANGIOPLASTY;  Surgeon: Serafina Mitchell, MD;  Location: [place] CV LAB;  Service: Cardiovascular;  Laterality: Left;  . PERIPHERAL VASCULAR CATHETERIZATION N/A 06/24/2015   Procedure: Aortic Arch Angiography;  Surgeon: Serafina Mitchell, MD;  Location: [place] CV LAB;  Service: Cardiovascular;  Laterality: N/A;  . PERIPHERAL VASCULAR CATHETERIZATION  06/24/2015    Procedure: Peripheral Vascular Intervention;  Surgeon: Serafina Mitchell, MD;  Location: [place] CV LAB;  Service: Cardiovascular;;  . PERIPHERAL VASCULAR CATHETERIZATION N/A 06/24/2015   Procedure: Abdominal Aortogram;  Surgeon: Serafina Mitchell, MD;  Location: [place] CV LAB;  Service: Cardiovascular;  Laterality: N/A;  . RENAL ANGIOGRAM Bilateral 09/11/2013   Procedure: RENAL ANGIOGRAM;  Surgeon: Serafina Mitchell, MD;  Location: [facility] CATH LAB;  Service: Cardiovascular;  Laterality: Bilateral;  . RIGHT FEMORAL-POPLITEAL BYPASS      Allergies as of 06/27/2018      Reactions   Isosorbide Other (See Comments)   Can only tolerate in low doses   Isosorbide Nitrate Other (See Comments)   Can only tolerate in low doses   Oxycodone-acetaminophen Itching   Codeine Itching   Contrast Media [iodinated Diagnostic Agents] Itching, Other (See Comments)   Itching of feet   Ioxaglate Itching, Other (See Comments)   Itching of feet   Metrizamide Itching, Other (See Comments)   Itching of feet   Percocet [oxycodone-acetaminophen] Itching, Other (See Comments)   Tolerates Hydrocodone      Medication List        Accurate as of 06/27/18 11:41 AM. Always use your most recent med list.          acetaminophen 325 MG tablet Commonly known as:  TYLENOL Take 325 mg by mouth every 6 (six) hours as needed (for pain).   albuterol 108 (90 Base) MCG/ACT inhaler Commonly known as:  PROVENTIL HFA;VENTOLIN HFA Inhale 2 puffs into the lungs every 4 (four) hours as needed for wheezing or shortness of breath. ProAir   allopurinol 100 MG tablet Commonly known as:  ZYLOPRIM Take 100 mg by mouth daily.   amLODipine 10 MG tablet Commonly known as:  NORVASC Take 10 mg by mouth daily.   atorvastatin 40 MG tablet Commonly known as:  LIPITOR Take 40 mg by mouth every evening.   budesonide-formoterol 160-4.5 MCG/ACT inhaler Commonly known as:  SYMBICORT Inhale 2 puffs into the lungs 2 (two) times daily as  needed (for flares).   carvedilol 12.5 MG tablet Commonly known as:  COREG Take 1 tablet (12.5 mg total) by mouth 2 (two) times daily with a meal.   clopidogrel 75 MG tablet Commonly known as:  PLAVIX Take 1 tablet (75 mg total) by mouth daily with breakfast.   cyanocobalamin 100 MCG tablet Take 1 tablet (100 mcg total) by mouth daily.   docusate sodium 100 MG capsule Commonly known as:  COLACE Take 200 mg by mouth daily as needed (when taking pain medications).   DULoxetine 60 MG capsule Commonly known as:  CYMBALTA Take 60 mg by mouth daily.  famotidine 20 MG tablet Commonly known as:  PEPCID TAKE 1 TABLET (20 MG TOTAL) BY MOUTH DAILY BEFORE SUPPER.   feeding supplement (PRO-STAT SUGAR FREE 64) Liqd Take 30 mLs by mouth 3 (three) times daily with meals.   fenofibrate 160 MG tablet Take 1 tablet (160 mg total) by mouth daily.   ferrous sulfate 325 (65 FE) MG tablet Take 325 mg by mouth 2 (two) times daily with a meal.   furosemide 40 MG tablet Commonly known as:  LASIX Take 1 tablet (40 mg total) by mouth 2 (two) times daily.   gabapentin 300 MG capsule Commonly known as:  NEURONTIN TAKE 1 CAPSULE(300 MG) BY MOUTH TWICE DAILY   hydrALAZINE 25 MG tablet Commonly known as:  APRESOLINE Take 1 tablet (25 mg total) by mouth every 8 (eight) hours.   HYDROcodone-acetaminophen 5-325 MG tablet Commonly known as:  NORCO/VICODIN Take 1 tablet by mouth every 6 (six) hours as needed for moderate pain.   isosorbide mononitrate 30 MG 24 hr tablet Commonly known as:  IMDUR Take 0.5 tablets (15 mg total) by mouth daily.   mirtazapine 7.5 MG tablet Commonly known as:  REMERON Take 7.5 mg by mouth at bedtime.   nicotine 14 mg/24hr patch Commonly known as:  NICODERM CQ - dosed in mg/24 hours Place 1 patch (14 mg total) onto the skin daily.   pantoprazole 40 MG tablet Commonly known as:  PROTONIX Take 1 tablet (40 mg total) by mouth 2 (two) times daily.   polyethylene  glycol packet Commonly known as:  MIRALAX / GLYCOLAX Take 17 g by mouth daily as needed for mild constipation.   spironolactone 25 MG tablet Commonly known as:  ALDACTONE Take 1 tablet (25 mg total) by mouth daily.   tamsulosin 0.4 MG Caps capsule Commonly known as:  FLOMAX Take 1 capsule (0.4 mg total) by mouth daily.   Vitamin D3 5000 units Tabs Take 5,000 Units by mouth daily.       No orders of the defined types were placed in this encounter.   Immunization History  Administered Date(s) Administered  . Influenza Whole 06/06/2010  . Pneumococcal Polysaccharide-23 06/06/2010    Social History   Tobacco Use  . Smoking status: Current Every Day Smoker    Years: 50.00    Types: Cigarettes  . Smokeless tobacco: Never Used  . Tobacco comment: 3/4 pk per day  Substance Use Topics  . Alcohol use: Yes    Alcohol/week: 0.0 standard drinks    Comment: occasionally    Family history is   Family History  Problem Relation Age of Onset  . Emphysema Mother   . Heart disease Mother        before age 54  . Hypertension Mother   . Hyperlipidemia Mother   . Heart attack Mother   . AAA (abdominal aortic aneurysm) Mother        rupture  . Heart attack Father 73  . Emphysema Father   . Heart disease Father        before age 70  . Hyperlipidemia Father   . Hypertension Father   . Peripheral vascular disease Father   . Diabetes Brother   . Heart disease Brother        before age 42  . Hyperlipidemia Brother   . Hypertension Brother   . Heart attack Brother        CABG  . Heart disease Other        Vascular disease in  grandparents, uncles and dad  . Colon cancer Neg Hx       Review of Systems  DATA OBTAINED: from patient, nurse, daughter GENERAL:  no fevers, fatigue, appetite changes SKIN: No itching, or rash EYES: No eye pain, redness, discharge EARS: No earache, tinnitus, change in hearing NOSE: No congestion, drainage or bleeding  MOUTH/THROAT: No mouth or  tooth pain, No sore throat RESPIRATORY: No cough, wheezing, SOB CARDIAC: No chest pain, palpitations, lower extremity edema  GI: mild  abdominal pain, No N/V/D or constipation, No heartburn or reflux  GU: No dysuria, frequency or urgency, or incontinence  MUSCULOSKELETAL: No unrelieved bone/joint pain NEUROLOGIC: No headache, dizziness or focal weakness PSYCHIATRIC: No c/o anxiety or sadness   Vitals:   06/27/18 1059  BP: (!) 151/70  Pulse: 64  Resp: 18  Temp: (!) 97.4 F (36.3 C)    SpO2 Readings from Last 1 Encounters:  06/26/18 98%   Body mass index is 25.79 kg/m.     Physical Exam  GENERAL APPEARANCE: Alert, conversant,  No acute distress.  SKIN: No diaphoresis rash HEAD: Normocephalic, atraumatic  EYES: Conjunctiva/lids clear. Pupils round, reactive. EOMs intact.  EARS: External exam WNL, canals clear. Hearing grossly normal.  NOSE: No deformity or discharge.  MOUTH/THROAT: Lips w/o lesions  RESPIRATORY: Breathing is even, unlabored. Lung sounds are clear   CARDIOVASCULAR: Heart RRR no murmurs, rubs or gallops. No peripheral edema.   GASTROINTESTINAL: Abdomen is soft, non-tender, not distended w/ normal bowel sounds. GENITOURINARY: Bladder non tender, not distended  MUSCULOSKELETAL: No abnormal joints or musculature NEUROLOGIC:  Cranial nerves 2-12 grossly intact. Moves all extremities  PSYCHIATRIC: Mood and affect appropriate to situation [place], no behavioral issues  Patient Active Problem List   Diagnosis Date Noted  . Physical deconditioning   . Esophageal ulcer with bleeding   . Pressure injury of skin 06/16/2018  . Hematemesis   . AKI (acute kidney injury) ([place]) 06/09/2018  . Goals of care, counseling/discussion   . Palliative care by specialist   . Hyperkalemia 06/08/2018  . Anasarca 06/07/2018  . Demand ischemia ([place]) 06/07/2018  . Protein-calorie malnutrition, severe 05/29/2018  . Type II diabetes mellitus with renal manifestations ([place])  05/28/2018  . Pancreatitis, recurrent 05/28/2018  . SIRS (systemic inflammatory response syndrome) ([place]) 05/28/2018  . Pancreatitis 05/28/2018  . Volume overload 04/17/2018  . Anemia due to GI blood loss 04/14/2018  . Acute gallstone pancreatitis 04/14/2018  . Acute cholecystitis 04/14/2018  . Gastrointestinal hemorrhage   . Fatigue associated with anemia 04/09/2018  . Hypoglycemia 01/19/2018  . Type II diabetes mellitus ([place]) 01/19/2018  . Nausea with vomiting 01/19/2018  . Uncontrolled hypertension 01/19/2018  . Hyperlipidemia 01/19/2018  . CAD (coronary artery disease) 01/19/2018  . COPD with hypoxia ([place]) 01/19/2018  . Chronic kidney disease (CKD), stage IV (severe) ([place]) 01/19/2018  . Retroperitoneal hematoma 01/19/2018  . Acute blood loss anemia 01/19/2018  . Chronic diastolic congestive heart failure ([place]) 01/19/2018  . AAA (abdominal aortic aneurysm) ([place]) 01/16/2018  . Elevated brain natriuretic peptide (BNP) level 10/07/2015  . Diabetes mellitus ([place]) 10/07/2015  . Pain in the chest   . PAD (peripheral artery disease) ([place]) 07/29/2015  . Chest pain 07/04/2015  . Acute on chronic kidney failure ([place]) 07/04/2015  . GERD (gastroesophageal reflux disease) 07/04/2015  . Anemia 06/26/2015  . Coronary artery disease involving autologous vein coronary bypass graft with unstable angina pectoris ([place]) 06/24/2015  . Angina at rest [facility])   . Subclavian artery stenosis, left (HCC)  06/23/2015  . AAA (abdominal aortic aneurysm) without rupture ([place]) 12/10/2014  . Subclavian steal syndrome 12/10/2014  . Renal artery stenosis ([place]) 09/04/2013  . Carotid artery stenosis 09/28/2012  . Mitral valve disorder 06/23/2011  . S/P CABG x 3 06/16/2011  . S/P Maze operation for atrial fibrillation 06/16/2011  . S/P MVR (mitral valve repair) 06/16/2011  . Follow-up examination following surgery 03/29/2011  . CKD (chronic kidney disease) 03/03/2011  . COPD (chronic obstructive pulmonary  disease) ([place]) 03/03/2011  . Chronic diastolic heart failure ([place]) 02/26/2011  . Hypotension 02/26/2011  . NSTEMI (non-ST elevated myocardial infarction) ([place]) 02/26/2011  . Long term (current) use of anticoagulants 12/17/2010  . OBSTRUCTIVE SLEEP APNEA 09/04/2010  . HYPOXEMIA 09/04/2010  . SNORING 08/05/2010  . Atrial fibrillation ([place]) 12/09/2009  . WEIGHT LOSS 03/28/2009  . PALPITATIONS 03/28/2009  . PVD 11/21/2008  . HYPERLIPIDEMIA-MIXED 11/20/2008  . TOBACCO ABUSE 11/20/2008  . Essential hypertension 11/20/2008  . Coronary artery disease involving native heart with angina pectoris ([place]) 11/20/2008  . ABDOMINAL AORTIC ANEURYSM 11/20/2008      Labs reviewed: Basic Metabolic Panel:    Component Value Date/Time   NA 140 06/25/2018 0259   K 4.2 06/26/2018 0602   CL 98 06/25/2018 0259   CO2 33 (H) 06/25/2018 0259   GLUCOSE 87 06/25/2018 0259   BUN 83 (H) 06/25/2018 0259   CREATININE 1.94 (H) 06/25/2018 0259   CREATININE 1.32 (H) 06/17/2015 1257   CALCIUM 8.5 (L) 06/25/2018 0259   PROT 5.2 (L) 06/12/2018 0422   ALBUMIN 2.1 (L) 06/12/2018 0422   AST 19 06/12/2018 0422   ALT 9 06/12/2018 0422   ALKPHOS 35 (L) 06/12/2018 0422   BILITOT 1.0 06/12/2018 0422   GFRNONAA 26 (L) 06/25/2018 0259   GFRAA 30 (L) 06/25/2018 0259    Recent Labs    06/10/18 6606  06/12/18 0422 06/13/18 0526  06/23/18 0305 06/24/18 0520 06/25/18 0259 06/26/18 0602  NA 138   < > 139 140   < > 139 140 140  --   K 5.1   < > 5.4* 5.4*   < > 4.0 3.7 3.4* 4.2  CL 96*   < > 100 103   < > 100 100 98  --   CO2 29   < > 28 24   < > 30 32 33*  --   GLUCOSE 123*   < > 84 82   < > 108* 109* 87  --   BUN 62*   < > 59* 57*   < > 84* 90* 83*  --   CREATININE 2.63*   < > 2.28* 2.32*   < > 2.03* 1.94* 1.94*  --   CALCIUM 9.3   < > 9.1 9.0   < > 8.5* 8.4* 8.5*  --   MG  --   --  1.9 2.1   < > 1.7 1.7 1.7  --   PHOS 6.5*  --  6.0* 5.6*  --   --   --   --   --    < > = values in this interval not displayed.     Liver Function Tests: Recent Labs    06/07/18 0452 06/08/18 0648 06/10/18 0628 06/12/18 0422  AST 19 23  --  19  ALT 10 9  --  9  ALKPHOS 39 32*  --  35*  BILITOT 0.6 0.8  --  1.0  PROT 5.1* 5.3*  --  5.2*  ALBUMIN 2.0* 2.1*  2.0* 2.1*   Recent Labs    06/10/18 0628 06/11/18 0444 06/12/18 0422  LIPASE 205* 1,307* 140*   No results for input(s): AMMONIA in the last 8760 hours. CBC: Recent Labs    04/19/18 0507 04/20/18 0450  05/29/18 1528  06/21/18 0750 06/22/18 0330 06/23/18 0956 06/24/18 0520  WBC 8.0 8.3   < > 14.1*   < > 14.0* 11.2*  --  9.5  NEUTROABS 6.7 7.0  --  12.9*  --   --   --   --   --   HGB 8.4* 8.3*   < > 8.2*   < > 9.3* 8.1* 9.1* 8.3*  HCT 27.5* 27.9*   < > 26.7*   < > 29.4* 26.4* 29.5* 27.4*  MCV 95.2 97.6   < > 100.4*   < > 93.9 95.7  --  97.2  PLT 228 220   < > 218   < > 237 239  --  318   < > = values in this interval not displayed.   Lipid Recent Labs    05/29/18 0335  CHOL 85  HDL 20*  LDLCALC 38  TRIG 135    Cardiac Enzymes: Recent Labs    05/29/18 1528 05/29/18 2013 05/30/18 0236  TROPONINI 0.44* 0.34* 0.31*   BNP: Recent Labs    04/17/18 0534 04/18/18 0501 05/28/18 2325  BNP 2,002.3* 1,099.7* 2,077.1*   No results found for: [facility] Lab Results  Component Value Date   HGBA1C 5.3 05/29/2018   TSH - 2.4 Lab Results  Component Value Date   VITAMINB12 264 06/07/2018   Lab Results  Component Value Date   FOLATE 11.8 06/07/2018   Lab Results  Component Value Date   IRON 45 06/07/2018   TIBC 286 06/07/2018   FERRITIN 1,466 (H) 06/07/2018    Imaging and Procedures obtained prior to SNF admission: Ct Abdomen Pelvis Wo Contrast  Result Date: 05/28/2018 CLINICAL DATA:  New tiny right lung nodule on chest radiograph. Severe right lower quadrant abdominal pain with nausea, vomiting and diarrhea. Chronic kidney disease. Prior cholecystectomy 04/19/2018, hysterectomy and appendectomy. Prior bowel resection. EXAM:  CT CHEST, ABDOMEN AND PELVIS WITHOUT CONTRAST TECHNIQUE: Multidetector CT imaging of the chest, abdomen and pelvis was performed following the standard protocol without IV contrast. COMPARISON:  Chest radiograph from earlier today. FINDINGS: CT CHEST FINDINGS Cardiovascular: Borderline mild cardiomegaly. No significant pericardial effusion/thickening. 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 Atherosclerotic nonaneurysmal thoracic aorta. Proximal left subclavian artery stent is noted. Dilated main pulmonary artery (4.0 cm diameter). Mediastinum/Nodes: No discrete thyroid nodules. Unremarkable esophagus. No axillary adenopathy. Mild right paratracheal adenopathy up to 1.1 cm (series 3/image 21). Mildly enlarged 1.1 cm subcarinal node (series 3/image 26). No discrete hilar adenopathy on this noncontrast scan. Lungs/Pleura: No pneumothorax. No pleural effusion. A few tiny calcified granulomas are present in the upper lobes bilaterally. No acute consolidative airspace disease, lung masses or significant pulmonary nodules. Diffuse interlobular septal thickening. Musculoskeletal: No aggressive appearing focal osseous lesions. Intact sternotomy wires. Moderate thoracic spondylosis. CT ABDOMEN PELVIS FINDINGS Hepatobiliary: Normal liver with no liver mass. Cholecystectomy. Bile ducts are within normal post cholecystectomy limits with CBD diameter 7 mm. Pancreas: Thickening of the pancreatic head and neck. Prominent peripancreatic fat stranding and ill-defined fluid in the pancreatic head and neck, worsened. Poorly defined 5.1 x 2.5 cm anterior pancreatic head complex fluid collection with heterogeneous internal density (series 3/image 66), increased from 2.3 x 1.2 cm. No definite pancreatic  duct dilation. Severe wall thickening throughout the adjacent descending duodenum, worsened. Fluid collection inferior to the gallbladder fossa measures 5.0 x 3.0 cm (series 3/image 71) and demonstrates layering  hyperdense material, new. Spleen: Normal size. No mass. Adrenals/Urinary Tract: Normal adrenals. No hydronephrosis. No renal stones. Partially exophytic simple 1.3 cm posterior interpolar left renal cyst. No additional contour deforming renal lesions. Normal bladder. Stomach/Bowel: Normal non-distended stomach. Normal caliber small bowel with no small bowel wall thickening. Appendectomy. New segmental wall thickening in the hepatic flexure of the colon. No additional sites of large bowel wall thickening. Stable postsurgical changes from distal large bowel resection. Vascular/Lymphatic: Atherosclerotic abdominal aorta with stable 5.0 cm infrarenal abdominal aortic aneurysm status post aorto bi-iliac stent graft repair. Surgical clips are noted in the right inguinal region. No pathologically enlarged lymph nodes in the abdomen or pelvis. Reproductive: Status post hysterectomy, with no abnormal findings at the vaginal cuff. No adnexal mass. Other: No pneumoperitoneum.  New small volume perihepatic ascites. Musculoskeletal: No aggressive appearing focal osseous lesions. Mild lumbar spondylosis. IMPRESSION: CT CHEST: 1. No lung masses or significant pulmonary nodules. 2. Borderline mild cardiomegaly. Diffuse interlobular septal thickening suggests mild pulmonary edema. No pleural effusions. 3. Dilated main pulmonary artery, suggesting pulmonary arterial hypertension. CT ABDOMEN PELVIS: 1. Worsening severe inflammatory changes surrounding the pancreatic head and neck, descending duodenum and hepatic flexure of the colon. Enlarging nonspecific complex poorly defined fluid collection in the anterior pancreatic head. New complex fluid collection inferior to the gallbladder fossa with layering hyperdense material. Burtis Junes worsening pancreatitis with enlarging pancreatic pseudocysts. Marked wall thickening in the descending duodenum has worsened, and could be reactive, with peptic ulcer disease difficult to exclude. No free air.  Short-term follow-up MRI or CT abdomen with IV contrast suggested. 2. New wall thickening throughout the hepatic flexure of the colon, probably reactive. 3. Cholecystectomy. No intrahepatic biliary ductal dilatation. CBD diameter 7 mm, within normal post cholecystectomy limits. 4. New small volume perihepatic ascites. 5.  Aortic Atherosclerosis (ICD10-I70.0). Electronically Signed   By: Ilona Sorrel M.D.   On: 05/28/2018 20:41   Ct Chest Wo Contrast  Result Date: 05/28/2018 CLINICAL DATA:  New tiny right lung nodule on chest radiograph. Severe right lower quadrant abdominal pain with nausea, vomiting and diarrhea. Chronic kidney disease. Prior cholecystectomy 04/19/2018, hysterectomy and appendectomy. Prior bowel resection. EXAM: CT CHEST, ABDOMEN AND PELVIS WITHOUT CONTRAST TECHNIQUE: Multidetector CT imaging of the chest, abdomen and pelvis was performed following the standard protocol without IV contrast. COMPARISON:  Chest radiograph from earlier today. FINDINGS: CT CHEST FINDINGS Cardiovascular: Borderline mild cardiomegaly. No significant pericardial effusion/thickening. 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 Atherosclerotic nonaneurysmal thoracic aorta. Proximal left subclavian artery stent is noted. Dilated main pulmonary artery (4.0 cm diameter). Mediastinum/Nodes: No discrete thyroid nodules. Unremarkable esophagus. No axillary adenopathy. Mild right paratracheal adenopathy up to 1.1 cm (series 3/image 21). Mildly enlarged 1.1 cm subcarinal node (series 3/image 26). No discrete hilar adenopathy on this noncontrast scan. Lungs/Pleura: No pneumothorax. No pleural effusion. A few tiny calcified granulomas are present in the upper lobes bilaterally. No acute consolidative airspace disease, lung masses or significant pulmonary nodules. Diffuse interlobular septal thickening. Musculoskeletal: No aggressive appearing focal osseous lesions. Intact sternotomy wires. Moderate thoracic  spondylosis. CT ABDOMEN PELVIS FINDINGS Hepatobiliary: Normal liver with no liver mass. Cholecystectomy. Bile ducts are within normal post cholecystectomy limits with CBD diameter 7 mm. Pancreas: Thickening of the pancreatic head and neck. Prominent peripancreatic fat stranding and ill-defined fluid in  the pancreatic head and neck, worsened. Poorly defined 5.1 x 2.5 cm anterior pancreatic head complex fluid collection with heterogeneous internal density (series 3/image 66), increased from 2.3 x 1.2 cm. No definite pancreatic duct dilation. Severe wall thickening throughout the adjacent descending duodenum, worsened. Fluid collection inferior to the gallbladder fossa measures 5.0 x 3.0 cm (series 3/image 71) and demonstrates layering hyperdense material, new. Spleen: Normal size. No mass. Adrenals/Urinary Tract: Normal adrenals. No hydronephrosis. No renal stones. Partially exophytic simple 1.3 cm posterior interpolar left renal cyst. No additional contour deforming renal lesions. Normal bladder. Stomach/Bowel: Normal non-distended stomach. Normal caliber small bowel with no small bowel wall thickening. Appendectomy. New segmental wall thickening in the hepatic flexure of the colon. No additional sites of large bowel wall thickening. Stable postsurgical changes from distal large bowel resection. Vascular/Lymphatic: Atherosclerotic abdominal aorta with stable 5.0 cm infrarenal abdominal aortic aneurysm status post aorto bi-iliac stent graft repair. Surgical clips are noted in the right inguinal region. No pathologically enlarged lymph nodes in the abdomen or pelvis. Reproductive: Status post hysterectomy, with no abnormal findings at the vaginal cuff. No adnexal mass. Other: No pneumoperitoneum.  New small volume perihepatic ascites. Musculoskeletal: No aggressive appearing focal osseous lesions. Mild lumbar spondylosis. IMPRESSION: CT CHEST: 1. No lung masses or significant pulmonary nodules. 2. Borderline mild  cardiomegaly. Diffuse interlobular septal thickening suggests mild pulmonary edema. No pleural effusions. 3. Dilated main pulmonary artery, suggesting pulmonary arterial hypertension. CT ABDOMEN PELVIS: 1. Worsening severe inflammatory changes surrounding the pancreatic head and neck, descending duodenum and hepatic flexure of the colon. Enlarging nonspecific complex poorly defined fluid collection in the anterior pancreatic head. New complex fluid collection inferior to the gallbladder fossa with layering hyperdense material. Burtis Junes worsening pancreatitis with enlarging pancreatic pseudocysts. Marked wall thickening in the descending duodenum has worsened, and could be reactive, with peptic ulcer disease difficult to exclude. No free air. Short-term follow-up MRI or CT abdomen with IV contrast suggested. 2. New wall thickening throughout the hepatic flexure of the colon, probably reactive. 3. Cholecystectomy. No intrahepatic biliary ductal dilatation. CBD diameter 7 mm, within normal post cholecystectomy limits. 4. New small volume perihepatic ascites. 5.  Aortic Atherosclerosis (ICD10-I70.0). Electronically Signed   By: Ilona Sorrel M.D.   On: 05/28/2018 20:41   Dg Chest Portable 1 View  Result Date: 05/28/2018 CLINICAL DATA:  Severe right lower quadrant pain. EXAM: PORTABLE CHEST 1 VIEW COMPARISON:  04/18/2018 FINDINGS: The lungs are clear without focal pneumonia, edema, pneumothorax or pleural effusion. Interstitial markings are diffusely coarsened with chronic features. Tiny nodular density identified right upper lung, superimposed on anterior right second rib. Patient is status post median sternotomy. Left atrial appendage occluded device noted. Vascular stent superimposed on the transverse aorta. The visualized bony structures of the thorax are intact. IMPRESSION: Cardiomegaly with mild vascular congestion or chronic interstitial coarsening. Tiny nodule right upper lung not visible on prior studies and  indeterminate. CT chest without contrast could be used to further evaluate. Electronically Signed   By: Misty Stanley M.D.   On: 05/28/2018 17:23   Dg Abd Portable 1v  Result Date: 05/29/2018 CLINICAL DATA:  Feeding tube placement EXAM: PORTABLE ABDOMEN - 1 VIEW COMPARISON:  None. FINDINGS: Gaseous distension of the stomach and proximal small bowel. Feeding tube with the tip projecting over antrum versus proximal duodenum. No evidence of pneumoperitoneum, portal venous gas or pneumatosis. No pathologic calcifications along the expected course of the ureters. Aorto bi-iliac stent graft is noted. Left renal artery stent  is noted. Osseous structures are unremarkable. IMPRESSION: Gaseous distension of the stomach and proximal small bowel. Feeding tube with the tip projecting over antrum versus proximal duodenum. Electronically Signed   By: Kathreen Devoid   On: 05/29/2018 15:30     Not all labs, radiology exams or other studies done during hospitalization come through on my EPIC note; however they are reviewed by me.    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- was placed on aggressive IV Lasix 60 mg twice daily with good results, she is 6 L negative fluid balance, TED stockings to legs elevate arms with 2-3 pillow in each arm much improved,; imperative to keep both arms elevated with 2 pillows on either them at all times SNF- continue Lasix 40 mg twice daily and Spironolactone 25 mg daily; will follow-up BMP for kidney function patients on a 1.5 L  daily fluid restriction  Urinary retention-treated with Foley and Flomax, voiding trial on 10/17 failed, Foley replaced with outpatient urology follow-up 1 to 2 weeks recommended SNF- will trauma one more voiding trial before sent to urology, patient's daughter request  Severe protein malnutrition SNF-continue Protostat  Hypertension SNF- controlled continue Coreg 12.5 mg twice daily Norvasc 10 mg daily Lasix 40 mg twice daily hydralazine 25 mg every 8 hours and M. Doerr 15 mg daily and Aldactone 25 mg daily  Chronic atrial fib, chads vas score greater than or equal to 3; stable SNF- continue Coreg 12.5 mg twice daily for rate; patient is not on anticoagulation at the time due to recent GI bleed  Recent endovascular repair of AAA/common iliac artery stent placement SNF- currently on Plavix 75 mg daily and statin, Lipitor 40 mg daily; will need follow-up with vascular surgeon   Time spent greater than 45 minutes;> 50% of time with patient was spent reviewing records, labs, tests and studies, counseling and developing plan of care  Webb Silversmith D. [name], MD

## 2018-06-28 ENCOUNTER — Non-Acute Institutional Stay: Payer: Medicare Other | Admitting: Internal Medicine

## 2018-06-28 DIAGNOSIS — Z515 Encounter for palliative care: Secondary | ICD-10-CM

## 2018-06-28 NOTE — Progress Notes (Signed)
    PALLIATIVE CARE CONSULT VISIT   PATIENT NAME: [name] DOB: [birth year] MRN: [id number]  PRIMARY CARE PROVIDER:   Bernerd Limbo, MD  REFERRING PROVIDER:  Dr. Kingsley Callander [facility]   NOTE:   Arrived at [facility] for Palliative care consultation with [name].  Pt was not in her room and was in the physical therapy department with multiple department persons.  I will return to snf at a later date for completion of appointment.       Gonzella Lex, NP-C

## 2018-06-28 NOTE — Telephone Encounter (Signed)
No answer no voice mail  

## 2018-06-29 ENCOUNTER — Telehealth: Payer: Self-pay

## 2018-06-29 NOTE — Telephone Encounter (Signed)
No answer no voice mail letter mailed with instructions

## 2018-06-29 NOTE — Telephone Encounter (Signed)
Attempted to call patient to cancel echo scheduled prior to Dr. Antionette Char appointment 10/28 as she had one 9/24. One number's VM is full and the other does not have VM set up.  Will try again later. Echo cancelled.

## 2018-06-30 ENCOUNTER — Other Ambulatory Visit: Payer: Self-pay

## 2018-06-30 ENCOUNTER — Emergency Department (HOSPITAL_COMMUNITY): Payer: Medicare Other

## 2018-06-30 ENCOUNTER — Emergency Department (HOSPITAL_COMMUNITY)
Admission: EM | Admit: 2018-06-30 | Discharge: 2018-06-30 | Payer: Medicare Other | Attending: Emergency Medicine | Admitting: Emergency Medicine

## 2018-06-30 ENCOUNTER — Encounter (HOSPITAL_COMMUNITY): Payer: Self-pay | Admitting: *Deleted

## 2018-06-30 DIAGNOSIS — S0101XA Laceration without foreign body of scalp, initial encounter: Secondary | ICD-10-CM | POA: Insufficient documentation

## 2018-06-30 DIAGNOSIS — Y9301 Activity, walking, marching and hiking: Secondary | ICD-10-CM | POA: Diagnosis not present

## 2018-06-30 DIAGNOSIS — I252 Old myocardial infarction: Secondary | ICD-10-CM | POA: Insufficient documentation

## 2018-06-30 DIAGNOSIS — Y999 Unspecified external cause status: Secondary | ICD-10-CM | POA: Diagnosis not present

## 2018-06-30 DIAGNOSIS — Y929 Unspecified place or not applicable: Secondary | ICD-10-CM | POA: Diagnosis not present

## 2018-06-30 DIAGNOSIS — E1122 Type 2 diabetes mellitus with diabetic chronic kidney disease: Secondary | ICD-10-CM | POA: Diagnosis not present

## 2018-06-30 DIAGNOSIS — N184 Chronic kidney disease, stage 4 (severe): Secondary | ICD-10-CM | POA: Diagnosis not present

## 2018-06-30 DIAGNOSIS — Z7902 Long term (current) use of antithrombotics/antiplatelets: Secondary | ICD-10-CM | POA: Insufficient documentation

## 2018-06-30 DIAGNOSIS — J449 Chronic obstructive pulmonary disease, unspecified: Secondary | ICD-10-CM | POA: Insufficient documentation

## 2018-06-30 DIAGNOSIS — W0110XA Fall on same level from slipping, tripping and stumbling with subsequent striking against unspecified object, initial encounter: Secondary | ICD-10-CM | POA: Diagnosis not present

## 2018-06-30 DIAGNOSIS — Z23 Encounter for immunization: Secondary | ICD-10-CM | POA: Diagnosis not present

## 2018-06-30 DIAGNOSIS — I130 Hypertensive heart and chronic kidney disease with heart failure and stage 1 through stage 4 chronic kidney disease, or unspecified chronic kidney disease: Secondary | ICD-10-CM | POA: Insufficient documentation

## 2018-06-30 DIAGNOSIS — W19XXXA Unspecified fall, initial encounter: Secondary | ICD-10-CM

## 2018-06-30 DIAGNOSIS — I2510 Atherosclerotic heart disease of native coronary artery without angina pectoris: Secondary | ICD-10-CM | POA: Diagnosis not present

## 2018-06-30 DIAGNOSIS — S0990XA Unspecified injury of head, initial encounter: Secondary | ICD-10-CM | POA: Diagnosis present

## 2018-06-30 DIAGNOSIS — I5032 Chronic diastolic (congestive) heart failure: Secondary | ICD-10-CM | POA: Insufficient documentation

## 2018-06-30 LAB — CBC WITH DIFFERENTIAL/PLATELET
Abs Immature Granulocytes: 0.1 10*3/uL — ABNORMAL HIGH (ref 0.00–0.07)
BASOS PCT: 1 %
Basophils Absolute: 0.1 10*3/uL (ref 0.0–0.1)
EOS PCT: 6 %
Eosinophils Absolute: 0.5 10*3/uL (ref 0.0–0.5)
HEMATOCRIT: 32.4 % — AB (ref 36.0–46.0)
Hemoglobin: 9.5 g/dL — ABNORMAL LOW (ref 12.0–15.0)
Immature Granulocytes: 1 %
LYMPHS ABS: 0.8 10*3/uL (ref 0.7–4.0)
Lymphocytes Relative: 9 %
MCH: 28 pg (ref 26.0–34.0)
MCHC: 29.3 g/dL — AB (ref 30.0–36.0)
MCV: 95.6 fL (ref 80.0–100.0)
MONOS PCT: 8 %
Monocytes Absolute: 0.7 10*3/uL (ref 0.1–1.0)
Neutro Abs: 6.6 10*3/uL (ref 1.7–7.7)
Neutrophils Relative %: 75 %
PLATELETS: 393 10*3/uL (ref 150–400)
RBC: 3.39 MIL/uL — ABNORMAL LOW (ref 3.87–5.11)
RDW: 16.8 % — AB (ref 11.5–15.5)
WBC: 8.8 10*3/uL (ref 4.0–10.5)
nRBC: 0 % (ref 0.0–0.2)

## 2018-06-30 LAB — COMPREHENSIVE METABOLIC PANEL
ALT: 14 U/L (ref 0–44)
ANION GAP: 16 — AB (ref 5–15)
AST: 25 U/L (ref 15–41)
Albumin: 2.2 g/dL — ABNORMAL LOW (ref 3.5–5.0)
Alkaline Phosphatase: 84 U/L (ref 38–126)
BILIRUBIN TOTAL: 0.6 mg/dL (ref 0.3–1.2)
BUN: 67 mg/dL — ABNORMAL HIGH (ref 8–23)
CO2: 34 mmol/L — ABNORMAL HIGH (ref 22–32)
Calcium: 9.1 mg/dL (ref 8.9–10.3)
Chloride: 91 mmol/L — ABNORMAL LOW (ref 98–111)
Creatinine, Ser: 2.26 mg/dL — ABNORMAL HIGH (ref 0.44–1.00)
GFR, EST AFRICAN AMERICAN: 25 mL/min — AB (ref >60)
GFR, EST NON AFRICAN AMERICAN: 22 mL/min — AB (ref >60)
Glucose, Bld: 101 mg/dL — ABNORMAL HIGH (ref 70–99)
POTASSIUM: 4.1 mmol/L (ref 3.5–5.1)
Sodium: 141 mmol/L (ref 135–145)
TOTAL PROTEIN: 5 g/dL — AB (ref 6.5–8.1)

## 2018-06-30 LAB — LIPASE, BLOOD: LIPASE: 63 U/L — AB (ref 11–51)

## 2018-06-30 MED ORDER — ACETAMINOPHEN 325 MG PO TABS
650.0000 mg | ORAL_TABLET | Freq: Once | ORAL | Status: AC
  Administered 2018-06-30: 650 mg via ORAL
  Filled 2018-06-30: qty 2

## 2018-06-30 MED ORDER — LIDOCAINE-EPINEPHRINE (PF) 2 %-1:200000 IJ SOLN
20.0000 mL | Freq: Once | INTRAMUSCULAR | Status: AC
  Administered 2018-06-30: 20 mL
  Filled 2018-06-30: qty 20

## 2018-06-30 MED ORDER — TETANUS-DIPHTH-ACELL PERTUSSIS 5-2.5-18.5 LF-MCG/0.5 IM SUSP
0.5000 mL | Freq: Once | INTRAMUSCULAR | Status: AC
  Administered 2018-06-30: 0.5 mL via INTRAMUSCULAR
  Filled 2018-06-30: qty 0.5

## 2018-06-30 MED ORDER — ACETAMINOPHEN 325 MG PO TABS: 325.0000 mg | ORAL_TABLET | Freq: Once | ORAL | Status: DC

## 2018-06-30 NOTE — ED Provider Notes (Signed)
[facility] EMERGENCY DEPARTMENT Provider Note   CSN: [id number] Arrival date & time: 06/30/18  8563     History   Chief Complaint Chief Complaint  Patient presents with  . Fall    HPI [name] is a 67 y.o. female.  Patient presents from rehab facility after fall.  States she was trying to get to the bathroom to have a bowel movement when she did not use her walker and lost her balance and fell.  She struck the back of her head on a table as well as her right arm.  She did not lose consciousness.  She does take aspirin and Plavix.  She denies any preceding dizziness or lightheadedness.  She complains of pain to the back of her head and her right arm.  Patient recently discharged from the hospital after prolonged hospitalization for pancreatitis, AKI, GI bleed and cholecystectomy.  Denies having any recent vomiting or blood in the stool.  Denies any chest pain or shortness of breath.  The history is provided by the patient and the EMS personnel.  Fall  Associated symptoms include headaches. Pertinent negatives include no chest pain, no abdominal pain and no shortness of breath.    Past Medical History:  Diagnosis Date  . AAA (abdominal aortic aneurysm) ([place]) 5/09   3.7x3.3 by u/s 2009  . Abnormal EKG    deep TW inversions chronic  . Anemia   . CAD (coronary artery disease)    a. CABG 03/2011 LIMA to LAD, SVG to OM, SVG to PDA. b. cath 06/25/2015 DES to SVG to rPDA, patent LIMA to LAD, patent SVG to OM  . carotid stenosis 5/09   S/p L CEA ;  60-79% bilat ICA by preCABG dopplers 7/12  . CHF (congestive heart failure) ([place])   . Chronic back pain    "all over" (06/24/2015)  . Chronic bronchitis ([place])    "get it pretty much q yr" (06/24/2015)  . CKD (chronic kidney disease)   . Complication of anesthesia 1966   "problem w/ether"  . COPD (chronic obstructive pulmonary disease) ([place])   . Depression   . GERD (gastroesophageal reflux disease)    With  hiatal hernia  . GIB (gastrointestinal bleeding) 9/11   S/p EGD with cautery at [facility]  . Heart murmur   . History of blood transfusion "a few times"   "all related to anemia" (06/24/2015)  . History of hiatal hernia   . Hyperlipidemia   . Hypertension   . Mitral regurgitation    3+ MR by intraoperative TEE;  s/p MV repair with Dr. Roxy Manns 7/12  . Myocardial infarction [facility]) 2012 "several"  . On home oxygen therapy    "2 liters at night; negative for sleep apnea"  . PAD (peripheral artery disease) (HCC)    Severe; s/p bilateral renal artery stents, moderate in-stent restenosis  . Pancreatitis 05/2018  . Paroxysmal atrial fibrillation (HCC)    coumadin;  echo 9/07: EF 60%, mild LVH;  s/p Cox Maze 7/12 with LAA clipping  . Subclavian artery stenosis, left (H[facility])    stented by Dr. Trula Slade on 10/18 to help flow of her LIMA to LAD  . Type II diabetes mellitus [facility])     Patient Active Problem List   Diagnosis Date Noted  . Physical deconditioning   . Esophageal ulcer with bleeding   . Pressure injury of skin 06/16/2018  . Hematemesis   . AKI (acute kidney injury) ([place]) 06/09/2018  . Palliative care  patient   . Palliative care by specialist   . Hyperkalemia 06/08/2018  . Anasarca 06/07/2018  . Demand ischemia ([place]) 06/07/2018  . Protein-calorie malnutrition, severe 05/29/2018  . Type II diabetes mellitus with renal manifestations ([place]) 05/28/2018  . Pancreatitis, recurrent 05/28/2018  . SIRS (systemic inflammatory response syndrome) ([place]) 05/28/2018  . Pancreatitis 05/28/2018  . Volume overload 04/17/2018  . Anemia due to GI blood loss 04/14/2018  . Acute gallstone pancreatitis 04/14/2018  . Acute cholecystitis 04/14/2018  . Gastrointestinal hemorrhage   . Fatigue associated with anemia 04/09/2018  . Hypoglycemia 01/19/2018  . Type II diabetes mellitus ([place]) 01/19/2018  . Nausea with vomiting 01/19/2018  . Uncontrolled hypertension 01/19/2018  . Hyperlipidemia 01/19/2018  . CAD  (coronary artery disease) 01/19/2018  . COPD with hypoxia ([place]) 01/19/2018  . Chronic kidney disease (CKD), stage IV (severe) ([place]) 01/19/2018  . Retroperitoneal hematoma 01/19/2018  . Acute blood loss anemia 01/19/2018  . Chronic diastolic congestive heart failure ([place]) 01/19/2018  . AAA (abdominal aortic aneurysm) ([place]) 01/16/2018  . Elevated brain natriuretic peptide (BNP) level 10/07/2015  . Diabetes mellitus ([place]) 10/07/2015  . Pain in the chest   . PAD (peripheral artery disease) ([employer]) 07/29/2015  . Chest pain 07/04/2015  . Acute on chronic kidney failure ([place]) 07/04/2015  . GERD (gastroesophageal reflux disease) 07/04/2015  . Anemia 06/26/2015  . Coronary artery disease involving autologous vein coronary bypass graft with unstable angina pectoris ([place]) 06/24/2015  . Angina at rest [facility])   . Subclavian artery stenosis, left ([place]) 06/23/2015  . AAA (abdominal aortic aneurysm) without rupture ([place]) 12/10/2014  . Subclavian steal syndrome 12/10/2014  . Renal artery stenosis ([place]) 09/04/2013  . Carotid artery stenosis 09/28/2012  . Mitral valve disorder 06/23/2011  . S/P CABG x 3 06/16/2011  . S/P Maze operation for atrial fibrillation 06/16/2011  . S/P MVR (mitral valve repair) 06/16/2011  . Follow-up examination following surgery 03/29/2011  . CKD (chronic kidney disease) 03/03/2011  . COPD (chronic obstructive pulmonary disease) ([place]) 03/03/2011  . Chronic diastolic heart failure ([place]) 02/26/2011  . Hypotension 02/26/2011  . NSTEMI (non-ST elevated myocardial infarction) ([place]) 02/26/2011  . Long term (current) use of anticoagulants 12/17/2010  . OBSTRUCTIVE SLEEP APNEA 09/04/2010  . HYPOXEMIA 09/04/2010  . SNORING 08/05/2010  . Atrial fibrillation ([place]) 12/09/2009  . WEIGHT LOSS 03/28/2009  . PALPITATIONS 03/28/2009  . PVD 11/21/2008  . HYPERLIPIDEMIA-MIXED 11/20/2008  . TOBACCO ABUSE 11/20/2008  . Essential hypertension 11/20/2008  . Coronary artery disease involving  native heart with angina pectoris ([place]) 11/20/2008  . ABDOMINAL AORTIC ANEURYSM 11/20/2008    Past Surgical History:  Procedure Laterality Date  . A/V FISTULAGRAM Left 03/21/2018   Procedure: A/V FISTULAGRAM;  Surgeon: Serafina Mitchell, MD;  Location: [facility] CV LAB;  Service: Cardiovascular;  Laterality: Left;  . ABDOMINAL AORTIC ENDOVASCULAR STENT GRAFT N/A 01/17/2018   Procedure: ABDOMINAL AORTIC ENDOVASCULAR STENT GRAFT WITH CO2;  Surgeon: Serafina Mitchell, MD;  Location: [facility];  Service: Vascular;  Laterality: N/A;  . ABDOMINAL HYSTERECTOMY  1990's  . APPENDECTOMY  Aug. 11, 2016   Ruptured  . AV FISTULA PLACEMENT Left 11/03/2017   Procedure: ARTERIOVENOUS (AV) FISTULA CREATION LEFT ARM;  Surgeon: Serafina Mitchell, MD;  Location: [facility];  Service: Vascular;  Laterality: Left;  . BOWEL RESECTION  2013  . CARDIAC CATHETERIZATION N/A 06/24/2015   Procedure: Left Heart Cath and Cors/Grafts Angiography;  Surgeon: Leonie Man, MD;  Location: [facility];  Service:  Cardiovascular;  Laterality: N/A;  . CARDIAC CATHETERIZATION N/A 06/25/2015   Procedure: Coronary Stent Intervention;  Surgeon: Sherren Mocha, MD;  Location: [facility];  Service: Cardiovascular;  Laterality: N/A;  . CAROTID ENDARTERECTOMY Left   . CATARACT EXTRACTION Right 03/27/2018  . CHOLECYSTECTOMY N/A 04/19/2018   Procedure: LAPAROSCOPIC CHOLECYSTECTOMY WITH INTRAOPERATIVE CHOLANGIOGRAM;  Surgeon: Rolm Bookbinder, MD;  Location: [facility];  Service: General;  Laterality: N/A;  . CORONARY ANGIOPLASTY    . CORONARY ARTERY BYPASS GRAFT  03/05/2011   CABG X3 (LIMA to LAD, SVG to OM, SVG to PDA, EVH via left thigh  . CORONARY STENT PLACEMENT    . ESOPHAGOGASTRODUODENOSCOPY N/A 04/12/2018   Procedure: ESOPHAGOGASTRODUODENOSCOPY (EGD);  Surgeon: [name], MD;  Location: [facility] ENDOSCOPY;  Service: Endoscopy;  Laterality: N/A;  . ESOPHAGOGASTRODUODENOSCOPY N/A 06/16/2018   Procedure: ESOPHAGOGASTRODUODENOSCOPY  (EGD);  Surgeon: Thornton Park, MD;  Location: [facility];  Service: Gastroenterology;  Laterality: N/A;  . LACERATION REPAIR Right 1990's   WRIST  . MAZE  03/05/2011   complete biatrial lesion set with clipping of LA appendage  . MITRAL VALVE REPAIR  03/05/2011   69mm Memo 3D ring annuloplasty for ischemic MR  . PERIPHERAL VASCULAR BALLOON ANGIOPLASTY Left 03/21/2018   Procedure: PERIPHERAL VASCULAR BALLOON ANGIOPLASTY;  Surgeon: Serafina Mitchell, MD;  Location: [facility];  Service: Cardiovascular;  Laterality: Left;  . PERIPHERAL VASCULAR CATHETERIZATION N/A 06/24/2015   Procedure: Aortic Arch Angiography;  Surgeon: Serafina Mitchell, MD;  Location: [facility];  Service: Cardiovascular;  Laterality: N/A;  . PERIPHERAL VASCULAR CATHETERIZATION  06/24/2015   Procedure: Peripheral Vascular Intervention;  Surgeon: Serafina Mitchell, MD;  Location: [facility];  Service: Cardiovascular;;  . PERIPHERAL VASCULAR CATHETERIZATION N/A 06/24/2015   Procedure: Abdominal Aortogram;  Surgeon: Serafina Mitchell, MD;  Location: [facility];  Service: Cardiovascular;  Laterality: N/A;  . RENAL ANGIOGRAM Bilateral 09/11/2013   Procedure: RENAL ANGIOGRAM;  Surgeon: Serafina Mitchell, MD;  Location: [facility] CATH LAB;  Service: Cardiovascular;  Laterality: Bilateral;  . RIGHT FEMORAL-POPLITEAL BYPASS       OB History   None      Home Medications    Prior to Admission medications   Medication Sig Start Date End Date Taking? Authorizing Provider  acetaminophen (TYLENOL) 325 MG tablet Take 325 mg by mouth every 6 (six) hours as needed (for pain).    [provider]  albuterol (PROVENTIL HFA;VENTOLIN HFA) 108 (90 Base) MCG/ACT inhaler Inhale 2 puffs into the lungs every 4 (four) hours as needed for wheezing or shortness of breath. ProAir Patient taking differently: Inhale 2 puffs into the lungs every 4 (four) hours as needed for wheezing or shortness of breath.  10/08/15    Delfina Redwood, MD  allopurinol (ZYLOPRIM) 100 MG tablet Take 100 mg by mouth daily. 02/02/17   [provider]  Amino Acids-Protein Hydrolys (FEEDING SUPPLEMENT, PRO-STAT SUGAR FREE 64,) LIQD Take 30 mLs by mouth 3 (three) times daily with meals. 06/26/18   Thurnell Lose, MD  amLODipine (NORVASC) 10 MG tablet Take 10 mg by mouth daily.      [provider]  atorvastatin (LIPITOR) 40 MG tablet Take 40 mg by mouth every evening.  12/07/17   [provider]  budesonide-formoterol (SYMBICORT) 160-4.5 MCG/ACT inhaler Inhale 2 puffs into the lungs 2 (two) times daily as needed (for flares).    [provider]  carvedilol (COREG) 12.5 MG tablet Take 1  tablet (12.5 mg total) by mouth 2 (two) times daily with a meal. 06/26/18   Thurnell Lose, MD  Cholecalciferol (VITAMIN D3) 5000 units TABS Take 5,000 Units by mouth daily.    [provider]  clopidogrel (PLAVIX) 75 MG tablet Take 1 tablet (75 mg total) by mouth daily with breakfast. 06/26/15   Almyra Deforest, PA  docusate sodium (COLACE) 100 MG capsule Take 200 mg by mouth daily as needed (when taking pain medications).     [provider]  DULoxetine (CYMBALTA) 60 MG capsule Take 60 mg by mouth daily.  10/26/17   [provider]  famotidine (PEPCID) 20 MG tablet TAKE 1 TABLET (20 MG TOTAL) BY MOUTH DAILY BEFORE SUPPER. Patient taking differently: Take 20 mg by mouth daily before supper.  01/27/16   Sherren Mocha, MD  fenofibrate 160 MG tablet Take 1 tablet (160 mg total) by mouth daily. Patient taking differently: Take 160 mg by mouth at bedtime.  10/08/15   Delfina Redwood, MD  ferrous sulfate 325 (65 FE) MG tablet Take 325 mg by mouth 2 (two) times daily with a meal.    [provider]  furosemide (LASIX) 40 MG tablet Take 1 tablet (40 mg total) by mouth 2 (two) times daily. 06/26/18   Thurnell Lose, MD  gabapentin (NEURONTIN) 300 MG capsule TAKE 1 CAPSULE(300 MG) BY  MOUTH TWICE DAILY Patient taking differently: Take 300 mg by mouth 2 (two) times daily.  05/05/18   Serafina Mitchell, MD  hydrALAZINE (APRESOLINE) 25 MG tablet Take 1 tablet (25 mg total) by mouth every 8 (eight) hours. 06/26/18   Thurnell Lose, MD  HYDROcodone-acetaminophen (NORCO/VICODIN) 5-325 MG tablet Take 1 tablet by mouth every 6 (six) hours as needed for moderate pain. 06/26/18   Thurnell Lose, MD  isosorbide mononitrate (IMDUR) 30 MG 24 hr tablet Take 0.5 tablets (15 mg total) by mouth daily. 12/02/15   Sherren Mocha, MD  mirtazapine (REMERON) 7.5 MG tablet Take 7.5 mg by mouth at bedtime.  02/15/18 08/14/18  [provider]  nicotine (NICODERM CQ - DOSED IN MG/24 HOURS) 14 mg/24hr patch Place 1 patch (14 mg total) onto the skin daily. 06/26/18   Thurnell Lose, MD  pantoprazole (PROTONIX) 40 MG tablet Take 1 tablet (40 mg total) by mouth 2 (two) times daily. 06/26/18   Thurnell Lose, MD  polyethylene glycol (MIRALAX / GLYCOLAX) packet Take 17 g by mouth daily as needed for mild constipation. 06/26/18   Thurnell Lose, MD  spironolactone (ALDACTONE) 25 MG tablet Take 1 tablet (25 mg total) by mouth daily. 06/26/18   Thurnell Lose, MD  tamsulosin (FLOMAX) 0.4 MG CAPS capsule Take 1 capsule (0.4 mg total) by mouth daily. 06/26/18   Thurnell Lose, MD  vitamin B-12 100 MCG tablet Take 1 tablet (100 mcg total) by mouth daily. 06/26/18   Thurnell Lose, MD    Family History Family History  Problem Relation Age of Onset  . Emphysema Mother   . Heart disease Mother        before age 67  . Hypertension Mother   . Hyperlipidemia Mother   . Heart attack Mother   . AAA (abdominal aortic aneurysm) Mother        rupture  . Heart attack Father 48  . Emphysema Father   . Heart disease Father        before age 58  . Hyperlipidemia Father   . Hypertension  Father   . Peripheral vascular disease Father   . Diabetes Brother   . Heart disease Brother         before age 50  . Hyperlipidemia Brother   . Hypertension Brother   . Heart attack Brother        CABG  . Heart disease Other        Vascular disease in grandparents, uncles and dad  . Colon cancer Neg Hx     Social History Social History   Tobacco Use  . Smoking status: Current Every Day Smoker    Years: 50.00    Types: Cigarettes  . Smokeless tobacco: Never Used  . Tobacco comment: 3/4 pk per day  Substance Use Topics  . Alcohol use: Yes    Alcohol/week: 0.0 standard drinks    Comment: occasionally  . Drug use: Yes    Types: Marijuana     Allergies   Isosorbide; Isosorbide nitrate; Oxycodone-acetaminophen; Codeine; Contrast media [iodinated diagnostic agents]; Ioxaglate; Metrizamide; and Percocet [oxycodone-acetaminophen]   Review of Systems Review of Systems  Constitutional: Negative for activity change, appetite change and fever.  HENT: Negative for congestion and rhinorrhea.   Eyes: Negative for visual disturbance.  Respiratory: Negative for cough, chest tightness and shortness of breath.   Cardiovascular: Negative for chest pain and leg swelling.  Gastrointestinal: Negative for abdominal pain, nausea and vomiting.  Genitourinary: Negative for dysuria, hematuria and vaginal bleeding.  Skin: Positive for wound.  Neurological: Positive for headaches. Negative for dizziness.   all other systems are negative except as noted in the HPI and PMH.     Physical Exam Updated Vital Signs BP (!) 128/34 (BP Location: Right Arm)   Pulse 64   Temp 97.9 F (36.6 C) (Oral)   Resp 14   Ht 5\' 5"  (1.651 m)   Wt 70 kg   SpO2 98%   BMI 25.68 kg/m   Physical Exam  Constitutional: She is oriented to person, place, and time. She appears well-developed and well-nourished. No distress.  HENT:  Head: Normocephalic and atraumatic.  Mouth/Throat: Oropharynx is clear and moist. No oropharyngeal exudate.  1 cm laceration to occiput. hemostatic  Eyes: Pupils are equal, round, and  reactive to light. Conjunctivae and EOM are normal.  Neck: Normal range of motion. Neck supple.  No C spine tenderness  Cardiovascular: Normal rate, regular rhythm, normal heart sounds and intact distal pulses.  No murmur heard. Pulmonary/Chest: Effort normal and breath sounds normal. No respiratory distress.  Abdominal: Soft. There is no tenderness. There is no rebound and no guarding.  Musculoskeletal: Normal range of motion. She exhibits no edema or tenderness.  Skin tear R forearm x2. No bony tenderness of elbow or wrist. FROM hips without pain  Neurological: She is alert and oriented to person, place, and time. No cranial nerve deficit. She exhibits normal muscle tone. Coordination normal.  No ataxia on finger to nose bilaterally. No pronator drift. 5/5 strength throughout. CN 2-12 intact.Equal grip strength. Sensation intact.   Skin: Skin is warm. Capillary refill takes less than 2 seconds.  Psychiatric: She has a normal mood and affect. Her behavior is normal.  Nursing note and vitals reviewed.    ED Treatments / Results  Labs (all labs ordered are listed, but only abnormal results are displayed) Labs Reviewed  CBC WITH DIFFERENTIAL/PLATELET - Abnormal; Notable for the following components:      Result Value   RBC 3.39 (*)    Hemoglobin 9.5 (*)  HCT 32.4 (*)    MCHC 29.3 (*)    RDW 16.8 (*)    Abs Immature Granulocytes 0.10 (*)    All other components within normal limits  COMPREHENSIVE METABOLIC PANEL - Abnormal; Notable for the following components:   Chloride 91 (*)    CO2 34 (*)    Glucose, Bld 101 (*)    BUN 67 (*)    Creatinine, Ser 2.26 (*)    Total Protein 5.0 (*)    Albumin 2.2 (*)    GFR calc non Af Amer 22 (*)    GFR calc Af Amer 25 (*)    Anion gap 16 (*)    All other components within normal limits  LIPASE, BLOOD - Abnormal; Notable for the following components:   Lipase 63 (*)    All other components within normal limits    EKG EKG  Interpretation  Date/Time:  Friday June 30 2018 05:05:38 EDT Ventricular Rate:  71 PR Interval:  128 QRS Duration: 106 QT Interval:  466 QTC Calculation: 506 R Axis:   33 Text Interpretation:  Normal sinus rhythm Incomplete right bundle branch block ST & T wave abnormality, consider inferior ischemia ST & T wave abnormality, consider anterolateral ischemia Prolonged QT Abnormal ECG No significant change was found Confirmed by Ezequiel Essex [phone]) on 06/30/2018 5:26:15 AM   Radiology Dg Forearm Right  Result Date: 06/30/2018 CLINICAL DATA:  Right forearm pain after fall. EXAM: RIGHT FOREARM - 2 VIEW COMPARISON:  None. FINDINGS: No acute fracture. Well corticated osseous density distal to the ulna styloid may be sequela of remote prior injury or an accessory ossicle. Wrist and elbow alignment is maintained. Bones are under mineralized. Mild soft tissue irregularity about the proximal forearm without radiopaque foreign body or tracking soft tissue air. IMPRESSION: No fracture of the right forearm. Electronically Signed   By: Keith Rake M.D.   On: 06/30/2018 04:57   Ct Head Wo Contrast  Result Date: 06/30/2018 CLINICAL DATA:  Fall striking head on floor. Head trauma, minor, GCS>=13, high clinical risk, initial exam EXAM: CT HEAD WITHOUT CONTRAST TECHNIQUE: Contiguous axial images were obtained from the base of the skull through the vertex without intravenous contrast. COMPARISON:  Brain MRI 06/20/2018.  Head CT 04/09/2018 FINDINGS: Brain: Unchanged degree of atrophy and chronic small vessel ischemia. No intracranial hemorrhage, mass effect, or midline shift. No hydrocephalus. The basilar cisterns are patent. No evidence of territorial infarct or acute ischemia. No extra-axial or intracranial fluid collection. Vascular: Atherosclerosis of skullbase vasculature without hyperdense vessel or abnormal calcification. Skull: No fracture or focal lesion. Sinuses/Orbits: Paranasal sinuses and  mastoid air cells are clear. The visualized orbits are unremarkable. Bilateral cataract resection. Other: Left frontoparietal scalp hematoma. Small right frontal scalp hematoma. IMPRESSION: Small left greater than right scalp hematomas. No acute intracranial abnormality. No skull fracture. Electronically Signed   By: Keith Rake M.D.   On: 06/30/2018 05:57    Procedures .Marland KitchenLaceration Repair Date/Time: 06/30/2018 6:11 AM Performed by: Ezequiel Essex, MD Authorized by: Ezequiel Essex, MD   Consent:    Consent obtained:  Verbal   Consent given by:  Patient   Risks discussed:  Need for additional repair, infection, retained foreign body, poor cosmetic result and pain   Alternatives discussed:  Delayed treatment Anesthesia (see MAR for exact dosages):    Anesthesia method:  Local infiltration   Local anesthetic:  Lidocaine 1% WITH epi Laceration details:    Location:  Scalp   Scalp location:  Occipital   Length (cm):  3 Repair type:    Repair type:  Simple Pre-procedure details:    Preparation:  Patient was prepped and draped in usual sterile fashion Exploration:    Hemostasis achieved with:  Epinephrine and direct pressure   Wound exploration: wound explored through full range of motion and entire depth of wound probed and visualized   Treatment:    Area cleansed with:  Shur-Clens   Amount of cleaning:  Standard   Irrigation solution:  Sterile saline   Irrigation volume:  1   Irrigation method:  Pressure wash Skin repair:    Repair method:  Staples   Number of staples:  3 Approximation:    Approximation:  Close Post-procedure details:    Dressing:  Antibiotic ointment   Patient tolerance of procedure:  Tolerated well, no immediate complications   (including critical care time)  Medications Ordered in ED Medications  Tdap (BOOSTRIX) injection 0.5 mL (has no administration in time range)  lidocaine-EPINEPHrine (XYLOCAINE W/EPI) 2 %-1:200000 (PF) injection 20 mL (has  no administration in time range)     Initial Impression / Assessment and Plan / ED Course  I have reviewed the triage vital signs and the nursing notes.  Pertinent labs & imaging results that were available during my care of the patient were reviewed by me and considered in my medical decision making (see chart for details).     From ECF with mechanical fall. No LOC. Sustained head injury.  Patient with complex medical history and recent prolonged hospitalization presenting with mechanical fall and head injury.  No syncope.  She is on Plavix and CT head is obtained which is negative for intracranial injury.  Laceration repaired as above. Tetanus updated.  Labs obtained given her recent hospitalization and complicated history.  Her hemoglobin is stable.  Her creatinine is 2.2 which not far off from her discharge creatinine of 1.9. Per nephrology notes, her baseline is 1.9-2.2. She has a fistula and is being prepared for dialysis  Patient is having her electrolytes and diuresis monitored at her rehab facility.  She is able to ambulate with assistance and appears stable to return to her facility. Discussed wound care and staple removal in 1 week. Return precautions discussed.   Final Clinical Impressions(s) / ED Diagnoses   Final diagnoses:  Fall, initial encounter  Laceration of scalp, initial encounter    ED Discharge Orders    None       [place] Behlke, Annie Main, MD 06/30/18 1625

## 2018-06-30 NOTE — Discharge Instructions (Signed)
Follow-up with your doctor for staple removal in 1 week.  You should also follow-up for recheck of your kidney function as scheduled.  Return to the ED if you develop chest pain, shortness of breath or other concerns.

## 2018-06-30 NOTE — ED Triage Notes (Signed)
Patient states she is at [facility] after being discharged from hospital on Monday for pancreatitis. States she felt like she was going to have diarrhea was trying to get to the bathroom, her walker was several feet from her and as she was trying to get to it her foot got caught on the bedside table and she fell hitting her head on the floor. 1/2 lac to the left upper portion of the back of her head. Several small skin tears to her right arm. Left arm is restricted with appropriate armband

## 2018-07-03 ENCOUNTER — Encounter (HOSPITAL_COMMUNITY): Payer: Self-pay

## 2018-07-03 ENCOUNTER — Encounter: Payer: Self-pay | Admitting: Internal Medicine

## 2018-07-03 ENCOUNTER — Other Ambulatory Visit (HOSPITAL_COMMUNITY): Payer: Self-pay

## 2018-07-03 ENCOUNTER — Non-Acute Institutional Stay (SKILLED_NURSING_FACILITY): Payer: Medicare Other | Admitting: Internal Medicine

## 2018-07-03 ENCOUNTER — Ambulatory Visit: Payer: Self-pay | Admitting: Cardiovascular Disease

## 2018-07-03 ENCOUNTER — Ambulatory Visit (HOSPITAL_COMMUNITY): Admit: 2018-07-03 | Payer: Medicare Other | Admitting: Gastroenterology

## 2018-07-03 DIAGNOSIS — Z710 Person encountering health services to consult on behalf of another person: Secondary | ICD-10-CM | POA: Diagnosis not present

## 2018-07-03 DIAGNOSIS — K851 Biliary acute pancreatitis without necrosis or infection: Secondary | ICD-10-CM | POA: Diagnosis not present

## 2018-07-03 LAB — BASIC METABOLIC PANEL
BUN: 52 — AB (ref 4–21)
Creatinine: 2.4 — AB (ref 0.5–1.1)
GLUCOSE: 107
Potassium: 3.4 (ref 3.4–5.3)
SODIUM: 143 (ref 137–147)

## 2018-07-03 LAB — CBC AND DIFFERENTIAL
HEMATOCRIT: 26 — AB (ref 36–46)
HEMOGLOBIN: 8.8 — AB (ref 12.0–16.0)
PLATELETS: 353 (ref 150–399)
WBC: 8.7

## 2018-07-03 SURGERY — COLONOSCOPY WITH PROPOFOL
Anesthesia: Monitor Anesthesia Care

## 2018-07-03 NOTE — Progress Notes (Signed)
:   Location:  [facility] Room Number: [phone] Place of Service:  SNF (31)  Noah Delaine. [name], MD  Patient Care Team: Bernerd Limbo, MD as PCP - General (Family Medicine) Sherren Mocha, MD as PCP - Cardiology (Cardiology) Mal Misty, MD (Inactive) as Consulting Physician (Vascular Surgery) Sharmon Revere as Physician Assistant (Cardiology) Elmarie Shiley, MD as Consulting Physician (Nephrology)  Extended Emergency Contact Information Primary Emergency Contact: [facility] Address: [place], [place] [address] Johnnette Litter of [place] Phone: [phone] Relation: Daughter Secondary Emergency Contact: Tyler,Shannon Address: [address]          [place], [place] [address] Johnnette Litter of [place] Mobile Phone: [phone] Relation: Daughter     Allergies: Isosorbide; Isosorbide nitrate; Oxycodone-acetaminophen; Codeine; Contrast media [iodinated diagnostic agents]; Ioxaglate; Metrizamide; and Percocet [oxycodone-acetaminophen]  Chief Complaint  Patient presents with  . Acute Visit    Care plan call    HPI: Patient is 67 y.o. female who was admitted to skilled nursing facility after a bout of pancreatitis, who has multiple other problems with his daughter I spoke about patient's condition, her ongoing condition and all of her doctor's appointments.  Patient is in no acute distress today, she is doing well but patient's daughter is anxious about all the new medical diagnoses she received while in the hospital.  Past Medical History:  Diagnosis Date  . AAA (abdominal aortic aneurysm) ([place]) 5/09   3.7x3.3 by u/s 2009  . Abnormal EKG    deep TW inversions chronic  . Anemia   . CAD (coronary artery disease)    a. CABG 03/2011 LIMA to LAD, SVG to OM, SVG to PDA. b. cath 06/25/2015 DES to SVG to rPDA, patent LIMA to LAD, patent SVG to OM  . carotid stenosis 5/09   S/p L CEA ;  60-79% bilat ICA by preCABG dopplers 7/12    . CHF (congestive heart failure) ([place])   . Chronic back pain    "all over" (06/24/2015)  . Chronic bronchitis ([place])    "get it pretty much q yr" (06/24/2015)  . CKD (chronic kidney disease)   . Complication of anesthesia 1966   "problem w/ether"  . COPD (chronic obstructive pulmonary disease) ([place])   . Depression   . GERD (gastroesophageal reflux disease)    With hiatal hernia  . GIB (gastrointestinal bleeding) 9/11   S/p EGD with cautery at [facility]  . Heart murmur   . History of blood transfusion "a few times"   "all related to anemia" (06/24/2015)  . History of hiatal hernia   . Hyperlipidemia   . Hypertension   . Mitral regurgitation    3+ MR by intraoperative TEE;  s/p MV repair with Dr. Roxy Manns 7/12  . Myocardial infarction [facility]) 2012 "several"  . On home oxygen therapy    "2 liters at night; negative for sleep apnea"  . PAD (peripheral artery disease) (HCC)    Severe; s/p bilateral renal artery stents, moderate in-stent restenosis  . Pancreatitis 05/2018  . Paroxysmal atrial fibrillation (HCC)    coumadin;  echo 9/07: EF 60%, mild LVH;  s/p Cox Maze 7/12 with LAA clipping  . Subclavian artery stenosis, left (HCC)    stented by Dr. Trula Slade on 10/18 to help flow of her LIMA to LAD  . Type II diabetes mellitus ([place])     Past Surgical History:  Procedure Laterality Date  .  A/V FISTULAGRAM Left 03/21/2018   Procedure: A/V FISTULAGRAM;  Surgeon: Serafina Mitchell, MD;  Location: [facility] CV LAB;  Service: Cardiovascular;  Laterality: Left;  . ABDOMINAL AORTIC ENDOVASCULAR STENT GRAFT N/A 01/17/2018   Procedure: ABDOMINAL AORTIC ENDOVASCULAR STENT GRAFT WITH CO2;  Surgeon: Serafina Mitchell, MD;  Location: [facility];  Service: Vascular;  Laterality: N/A;  . ABDOMINAL HYSTERECTOMY  1990's  . APPENDECTOMY  Aug. 11, 2016   Ruptured  . AV FISTULA PLACEMENT Left 11/03/2017   Procedure: ARTERIOVENOUS (AV) FISTULA CREATION LEFT ARM;  Surgeon: Serafina Mitchell, MD;  Location: [facility];  Service:  Vascular;  Laterality: Left;  . BOWEL RESECTION  2013  . CARDIAC CATHETERIZATION N/A 06/24/2015   Procedure: Left Heart Cath and Cors/Grafts Angiography;  Surgeon: Leonie Man, MD;  Location: [facility] CV LAB;  Service: Cardiovascular;  Laterality: N/A;  . CARDIAC CATHETERIZATION N/A 06/25/2015   Procedure: Coronary Stent Intervention;  Surgeon: Sherren Mocha, MD;  Location: [facility] CV LAB;  Service: Cardiovascular;  Laterality: N/A;  . CAROTID ENDARTERECTOMY Left   . CATARACT EXTRACTION Right 03/27/2018  . CHOLECYSTECTOMY N/A 04/19/2018   Procedure: LAPAROSCOPIC CHOLECYSTECTOMY WITH INTRAOPERATIVE CHOLANGIOGRAM;  Surgeon: Rolm Bookbinder, MD;  Location: [facility];  Service: General;  Laterality: N/A;  . CORONARY ANGIOPLASTY    . CORONARY ARTERY BYPASS GRAFT  03/05/2011   CABG X3 (LIMA to LAD, SVG to OM, SVG to PDA, EVH via left thigh  . CORONARY STENT PLACEMENT    . ESOPHAGOGASTRODUODENOSCOPY N/A 04/12/2018   Procedure: ESOPHAGOGASTRODUODENOSCOPY (EGD);  Surgeon: [name], MD;  Location: [facility] ENDOSCOPY;  Service: Endoscopy;  Laterality: N/A;  . ESOPHAGOGASTRODUODENOSCOPY N/A 06/16/2018   Procedure: ESOPHAGOGASTRODUODENOSCOPY (EGD);  Surgeon: Thornton Park, MD;  Location: [facility];  Service: Gastroenterology;  Laterality: N/A;  . LACERATION REPAIR Right 1990's   WRIST  . MAZE  03/05/2011   complete biatrial lesion set with clipping of LA appendage  . MITRAL VALVE REPAIR  03/05/2011   61mm Memo 3D ring annuloplasty for ischemic MR  . PERIPHERAL VASCULAR BALLOON ANGIOPLASTY Left 03/21/2018   Procedure: PERIPHERAL VASCULAR BALLOON ANGIOPLASTY;  Surgeon: Serafina Mitchell, MD;  Location: [facility] CV LAB;  Service: Cardiovascular;  Laterality: Left;  . PERIPHERAL VASCULAR CATHETERIZATION N/A 06/24/2015   Procedure: Aortic Arch Angiography;  Surgeon: Serafina Mitchell, MD;  Location: [facility] [place] [facility] LAB;  Service: Cardiovascular;  Laterality: N/A;  . PERIPHERAL VASCULAR  CATHETERIZATION  06/24/2015   Procedure: Peripheral Vascular Intervention;  Surgeon: Serafina Mitchell, MD;  Location: [facility] CV LAB;  Service: Cardiovascular;;  . PERIPHERAL VASCULAR CATHETERIZATION N/A 06/24/2015   Procedure: Abdominal Aortogram;  Surgeon: Serafina Mitchell, MD;  Location: [place] CV LAB;  Service: Cardiovascular;  Laterality: N/A;  . RENAL ANGIOGRAM Bilateral 09/11/2013   Procedure: RENAL ANGIOGRAM;  Surgeon: Serafina Mitchell, MD;  Location: [facility] C[facility] LAB;  Service: Cardiovascular;  Laterality: Bilateral;  . RIGHT FEMORAL-POPLITEAL BYPASS      Allergies as of 07/03/2018      Reactions   Isosorbide Other (See Comments)   Can only tolerate in low doses   Isosorbide Nitrate Other (See Comments)   Can only tolerate in low doses   Oxycodone-acetaminophen Itching   Codeine Itching   Contrast Media [iodinated Diagnostic Agents] Itching, Other (See Comments)   Itching of feet   Ioxaglate Itching, Other (See Comments)   Itching of feet   Metrizamide Itching, Other (See Comments)   Itching of feet   Percocet [  oxycodone-acetaminophen] Itching, Other (See Comments)   Tolerates Hydrocodone      Medication List        Accurate as of 07/03/18  4:32 PM. Always use your most recent med list.          acetaminophen 325 MG tablet Commonly known as:  TYLENOL Take 325 mg by mouth every 6 (six) hours as needed (for pain).   albuterol 108 (90 Base) MCG/ACT inhaler Commonly known as:  PROVENTIL HFA;VENTOLIN HFA Inhale 2 puffs into the lungs every 4 (four) hours as needed for wheezing or shortness of breath. ProAir   allopurinol 100 MG tablet Commonly known as:  ZYLOPRIM Take 100 mg by mouth daily.   amLODipine 10 MG tablet Commonly known as:  NORVASC Take 10 mg by mouth daily.   atorvastatin 40 MG tablet Commonly known as:  LIPITOR Take 40 mg by mouth every evening.   budesonide-formoterol 160-4.5 MCG/ACT inhaler Commonly known as:  SYMBICORT Inhale 2 puffs into  the lungs 2 (two) times daily as needed (for flares).   carvedilol 12.5 MG tablet Commonly known as:  COREG Take 1 tablet (12.5 mg total) by mouth 2 (two) times daily with a meal.   clopidogrel 75 MG tablet Commonly known as:  PLAVIX Take 1 tablet (75 mg total) by mouth daily with breakfast.   cyanocobalamin 100 MCG tablet Take 1 tablet (100 mcg total) by mouth daily.   docusate sodium 100 MG capsule Commonly known as:  COLACE Take 200 mg by mouth daily as needed (when taking pain medications).   DULoxetine 60 MG capsule Commonly known as:  CYMBALTA Take 60 mg by mouth daily.   famotidine 20 MG tablet Commonly known as:  PEPCID TAKE 1 TABLET (20 MG TOTAL) BY MOUTH DAILY BEFORE SUPPER.   feeding supplement (PRO-STAT SUGAR FREE 64) Liqd Take 30 mLs by mouth 3 (three) times daily with meals.   fenofibrate 160 MG tablet Take 1 tablet (160 mg total) by mouth daily.   ferrous sulfate 325 (65 FE) MG tablet Take 325 mg by mouth 2 (two) times daily with a meal.   furosemide 40 MG tablet Commonly known as:  LASIX Take 1 tablet (40 mg total) by mouth 2 (two) times daily.   gabapentin 300 MG capsule Commonly known as:  NEURONTIN TAKE 1 CAPSULE(300 MG) BY MOUTH TWICE DAILY   hydrALAZINE 25 MG tablet Commonly known as:  APRESOLINE Take 1 tablet (25 mg total) by mouth every 8 (eight) hours.   HYDROcodone-acetaminophen 5-325 MG tablet Commonly known as:  NORCO/VICODIN Take 1 tablet by mouth every 6 (six) hours as needed for moderate pain.   isosorbide mononitrate 30 MG 24 hr tablet Commonly known as:  IMDUR Take 0.5 tablets (15 mg total) by mouth daily.   mirtazapine 7.5 MG tablet Commonly known as:  REMERON Take 7.5 mg by mouth at bedtime.   nicotine 14 mg/24hr patch Commonly known as:  NICODERM CQ - dosed in mg/24 hours Place 1 patch (14 mg total) onto the skin daily.   pantoprazole 40 MG tablet Commonly known as:  PROTONIX Take 1 tablet (40 mg total) by mouth 2  (two) times daily.   polyethylene glycol packet Commonly known as:  MIRALAX / GLYCOLAX Take 17 g by mouth daily as needed for mild constipation.   spironolactone 25 MG tablet Commonly known as:  ALDACTONE Take 1 tablet (25 mg total) by mouth daily.   tamsulosin 0.4 MG Caps capsule Commonly known as:  FLOMAX Take  1 capsule (0.4 mg total) by mouth daily.   Vitamin D3 5000 units Tabs Take 5,000 Units by mouth daily.       No orders of the defined types were placed in this encounter.   Immunization History  Administered Date(s) Administered  . Influenza Whole 06/06/2010  . Pneumococcal Polysaccharide-23 06/06/2010  . Tdap 06/30/2018    Social History   Tobacco Use  . Smoking status: Current Every Day Smoker    Years: 50.00    Types: Cigarettes  . Smokeless tobacco: Never Used  . Tobacco comment: 3/4 pk per day  Substance Use Topics  . Alcohol use: Yes    Alcohol/week: 0.0 standard drinks    Comment: occasionally    Family history is   Family History  Problem Relation Age of Onset  . Emphysema Mother   . Heart disease Mother        before age 42  . Hypertension Mother   . Hyperlipidemia Mother   . Heart attack Mother   . AAA (abdominal aortic aneurysm) Mother        rupture  . Heart attack Father 5  . Emphysema Father   . Heart disease Father        before age 78  . Hyperlipidemia Father   . Hypertension Father   . Peripheral vascular disease Father   . Diabetes Brother   . Heart disease Brother        before age 49  . Hyperlipidemia Brother   . Hypertension Brother   . Heart attack Brother        CABG  . Heart disease Other        Vascular disease in grandparents, uncles and dad  . Colon cancer Neg Hx       Review of Systems  DATA OBTAINED: from patient, nurse GENERAL:  no fevers, fatigue, appetite changes SKIN: No itching, or rash EYES: No eye pain, redness, discharge EARS: No earache, tinnitus, change in hearing NOSE: No congestion,  drainage or bleeding  MOUTH/THROAT: No mouth or tooth pain, No sore throat RESPIRATORY: No cough, wheezing, SOB CARDIAC: No chest pain, palpitations, lower extremity edema  GI: No abdominal pain, No N/V/D or constipation, No heartburn or reflux  GU: No dysuria, frequency or urgency, or incontinence  MUSCULOSKELETAL: No unrelieved bone/joint pain NEUROLOGIC: No headache, dizziness or focal weakness PSYCHIATRIC: No c/o anxiety or sadness   Vitals:   07/03/18 1623  BP: (!) 152/68  Pulse: 74  Resp: 18  Temp: 98 F (36.7 C)    SpO2 Readings from Last 1 Encounters:  06/30/18 98%   Body mass index is 25.68 kg/m.     Physical Exam  GENERAL APPEARANCE: Alert, conversant,  No acute distress.  SKIN: No diaphoresis rash HEAD: Normocephalic, atraumatic  EYES: Conjunctiva/lids clear. Pupils round, reactive. EOMs intact.  EARS: External exam WNL, canals clear. Hearing grossly normal.  NOSE: No deformity or discharge.  MOUTH/THROAT: Lips w/o lesions  RESPIRATORY: Breathing is even, unlabored. Lung sounds are clear   CARDIOVASCULAR: Heart RRR no murmurs, rubs or gallops. No peripheral edema.   GASTROINTESTINAL: Abdomen is soft, non-tender, not distended w/ normal bowel sounds. GENITOURINARY: Bladder non tender, not distended  MUSCULOSKELETAL: No abnormal joints or musculature NEUROLOGIC:  Cranial nerves 2-12 grossly intact. Moves all extremities  PSYCHIATRIC: Mood and affect appropriate to situation, no behavioral issues  Patient Active Problem List   Diagnosis Date Noted  . Physical deconditioning   . Esophageal ulcer with bleeding   .  Pressure injury of skin 06/16/2018  . Hematemesis   . AKI (acute kidney injury) ([place]) 06/09/2018  . Palliative care patient   . Palliative care by specialist   . Hyperkalemia 06/08/2018  . Anasarca 06/07/2018  . Demand ischemia ([place]) 06/07/2018  . Protein-calorie malnutrition, severe 05/29/2018  . Type II diabetes mellitus with renal  manifestations ([place]) 05/28/2018  . Pancreatitis, recurrent 05/28/2018  . SIRS (systemic inflammatory response syndrome) ([place]) 05/28/2018  . Pancreatitis 05/28/2018  . Volume overload 04/17/2018  . Anemia due to GI blood loss 04/14/2018  . Acute gallstone pancreatitis 04/14/2018  . Acute cholecystitis 04/14/2018  . Gastrointestinal hemorrhage   . Fatigue associated with anemia 04/09/2018  . Hypoglycemia 01/19/2018  . Type II diabetes mellitus ([place]) 01/19/2018  . Nausea with vomiting 01/19/2018  . Uncontrolled hypertension 01/19/2018  . Hyperlipidemia 01/19/2018  . CAD (coronary artery disease) 01/19/2018  . COPD with hypoxia ([place]) 01/19/2018  . Chronic kidney disease (CKD), stage IV (severe) ([place]) 01/19/2018  . Retroperitoneal hematoma 01/19/2018  . Acute blood loss anemia 01/19/2018  . Chronic diastolic congestive heart failure ([place]) 01/19/2018  . AAA (abdominal aortic aneurysm) ([place]) 01/16/2018  . Elevated brain natriuretic peptide (BNP) level 10/07/2015  . Diabetes mellitus ([place]) 10/07/2015  . Pain in the chest   . PAD (peripheral artery disease) ([place]) 07/29/2015  . Chest pain 07/04/2015  . Acute on chronic kidney failure ([place]) 07/04/2015  . GERD (gastroesophageal reflux disease) 07/04/2015  . Anemia 06/26/2015  . Coronary artery disease involving autologous vein coronary bypass graft with unstable angina pectoris ([place]) 06/24/2015  . Angina at rest [facility])   . Subclavian artery stenosis, left ([place]) 06/23/2015  . AAA (abdominal aortic aneurysm) without rupture ([place]) 12/10/2014  . Subclavian steal syndrome 12/10/2014  . Renal artery stenosis ([place]) 09/04/2013  . Carotid artery stenosis 09/28/2012  . Mitral valve disorder 06/23/2011  . S/P CABG x 3 06/16/2011  . S/P Maze operation for atrial fibrillation 06/16/2011  . S/P MVR (mitral valve repair) 06/16/2011  . Follow-up examination following surgery 03/29/2011  . CKD (chronic kidney disease) 03/03/2011  . COPD (chronic  obstructive pulmonary disease) ([place]) 03/03/2011  . Chronic diastolic heart failure ([place]) 02/26/2011  . Hypotension 02/26/2011  . NSTEMI (non-ST elevated myocardial infarction) ([place]) 02/26/2011  . Long term (current) use of anticoagulants 12/17/2010  . OBSTRUCTIVE SLEEP APNEA 09/04/2010  . HYPOXEMIA 09/04/2010  . SNORING 08/05/2010  . Atrial fibrillation ([place]) 12/09/2009  . WEIGHT LOSS 03/28/2009  . PALPITATIONS 03/28/2009  . PVD 11/21/2008  . HYPERLIPIDEMIA-MIXED 11/20/2008  . TOBACCO ABUSE 11/20/2008  . Essential hypertension 11/20/2008  . Coronary artery disease involving native heart with angina pectoris ([place]) 11/20/2008  . ABDOMINAL AORTIC ANEURYSM 11/20/2008      Labs reviewed: Basic Metabolic Panel:    Component Value Date/Time   NA 143 07/03/2018   K 3.4 07/03/2018   CL 91 (L) 06/30/2018 0545   CO2 34 (H) 06/30/2018 0545   GLUCOSE 101 (H) 06/30/2018 0545   BUN 52 (A) 07/03/2018   CREATININE 2.4 (A) 07/03/2018   CREATININE 2.26 (H) 06/30/2018 0545   CREATININE 1.32 (H) 06/17/2015 1257   CALCIUM 9.1 06/30/2018 0545   PROT 5.0 (L) 06/30/2018 0545   ALBUMIN 2.2 (L) 06/30/2018 0545   AST 25 06/30/2018 0545   ALT 14 06/30/2018 0545   ALKPHOS 84 06/30/2018 0545   BILITOT 0.6 06/30/2018 0545   GFRNONAA 22 (L) 06/30/2018 0545   GFRAA 25 (L) 06/30/2018 0545    Recent Labs  06/10/18 2841  06/12/18 0422 06/13/18 3244  06/23/18 0305 06/24/18 0520 06/25/18 0259 06/26/18 0602 06/30/18 0545 07/03/18  NA 138   < > 139 140   < > 139 140 140  --  141 143  K 5.1   < > 5.4* 5.4*   < > 4.0 3.7 3.4* 4.2 4.1 3.4  CL 96*   < > 100 103   < > 100 100 98  --  91*  --   CO2 29   < > 28 24   < > 30 32 33*  --  34*  --   GLUCOSE 123*   < > 84 82   < > 108* 109* 87  --  101*  --   BUN 62*   < > 59* 57*   < > 84* 90* 83*  --  67* 52*  CREATININE 2.63*   < > 2.28* 2.32*   < > 2.03* 1.94* 1.94*  --  2.26* 2.4*  CALCIUM 9.3   < > 9.1 9.0   < > 8.5* 8.4* 8.5*  --  9.1  --   MG  --    --  1.9 2.1   < > 1.7 1.7 1.7  --   --   --   PHOS 6.5*  --  6.0* 5.6*  --   --   --   --   --   --   --    < > = values in this interval not displayed.   Liver Function Tests: Recent Labs    06/08/18 0648 06/10/18 0628 06/12/18 0422 06/30/18 0545  AST 23  --  19 25  ALT 9  --  9 14  ALKPHOS 32*  --  35* 84  BILITOT 0.8  --  1.0 0.6  PROT 5.3*  --  5.2* 5.0*  ALBUMIN 2.1* 2.0* 2.1* 2.2*   Recent Labs    06/11/18 0444 06/12/18 0422 06/30/18 0545  LIPASE 1,307* 140* 63*   No results for input(s): AMMONIA in the last 8760 hours. CBC: Recent Labs    04/20/18 0450  05/29/18 1528  06/22/18 0330  06/24/18 0520 06/30/18 0545 07/03/18  WBC 8.3   < > 14.1*   < > 11.2*  --  9.5 8.8 8.7  NEUTROABS 7.0  --  12.9*  --   --   --   --  6.6  --   HGB 8.3*   < > 8.2*   < > 8.1*   < > 8.3* 9.5* 8.8*  HCT 27.9*   < > 26.7*   < > 26.4*   < > 27.4* 32.4* 26*  MCV 97.6   < > 100.4*   < > 95.7  --  97.2 95.6  --   PLT 220   < > 218   < > 239  --  318 393 353   < > = values in this interval not displayed.   Lipid Recent Labs    05/29/18 0335  CHOL 85  HDL 20*  LDLCALC 38  TRIG 135    Cardiac Enzymes: Recent Labs    05/29/18 1528 05/29/18 2013 05/30/18 0236  TROPONINI 0.44* 0.34* 0.31*   BNP: Recent Labs    04/17/18 0534 04/18/18 0501 05/28/18 2325  BNP 2,002.3* 1,099.7* 2,077.1*   No results found for: [facility] Lab Results  Component Value Date   HGBA1C 5.3 05/29/2018   TSH-2.4 Lab Results  Component Value Date   VITAMINB12 264  06/07/2018   Lab Results  Component Value Date   FOLATE 11.8 06/07/2018   Lab Results  Component Value Date   IRON 45 06/07/2018   TIBC 286 06/07/2018   FERRITIN 1,466 (H) 06/07/2018    Imaging and Procedures obtained prior to SNF admission: Dg Forearm Right  Result Date: 06/30/2018 CLINICAL DATA:  Right forearm pain after fall. EXAM: RIGHT FOREARM - 2 VIEW COMPARISON:  None. FINDINGS: No acute fracture. Well corticated  osseous density distal to the ulna styloid may be sequela of remote prior injury or an accessory ossicle. Wrist and elbow alignment is maintained. Bones are under mineralized. Mild soft tissue irregularity about the proximal forearm without radiopaque foreign body or tracking soft tissue air. IMPRESSION: No fracture of the right forearm. Electronically Signed   By: Keith Rake M.D.   On: 06/30/2018 04:57   Ct Head Wo Contrast  Result Date: 06/30/2018 CLINICAL DATA:  Fall striking head on floor. Head trauma, minor, GCS>=13, high clinical risk, initial exam EXAM: CT HEAD WITHOUT CONTRAST TECHNIQUE: Contiguous axial images were obtained from the base of the skull through the vertex without intravenous contrast. COMPARISON:  Brain MRI 06/20/2018.  Head CT 04/09/2018 FINDINGS: Brain: Unchanged degree of atrophy and chronic small vessel ischemia. No intracranial hemorrhage, mass effect, or midline shift. No hydrocephalus. The basilar cisterns are patent. No evidence of territorial infarct or acute ischemia. No extra-axial or intracranial fluid collection. Vascular: Atherosclerosis of skullbase vasculature without hyperdense vessel or abnormal calcification. Skull: No fracture or focal lesion. Sinuses/Orbits: Paranasal sinuses and mastoid air cells are clear. The visualized orbits are unremarkable. Bilateral cataract resection. Other: Left frontoparietal scalp hematoma. Small right frontal scalp hematoma. IMPRESSION: Small left greater than right scalp hematomas. No acute intracranial abnormality. No skull fracture. Electronically Signed   By: Keith Rake M.D.   On: 06/30/2018 05:57     Not all labs, radiology exams or other studies done during hospitalization come through on my EPIC note; however they are reviewed by me.    Assessment and Plan  pancreatitis /encounter for family conference- patient's daughter had multiple questions including how was patient going to be set up for her multiple post  hospital appointments and how she was going to get there and whether she needed to go to all these appointments, how is the family going to know in the future whether she has pancreatitis and what to they do at home to treat it if possible in 1 to they know when to take her to the hospital, and other concerns too numerous to count as well as some frustration and anxiety over patient's new medical problem.  All questions were answered, daughter's anxiety and concerns were listened to and addressed if possible.   Time spent greater than 35 minutes Laniesha Das D. [name], MD

## 2018-07-03 NOTE — Telephone Encounter (Signed)
[name] called Friday and cancelled OV as well. She is not in network.

## 2018-07-04 ENCOUNTER — Encounter: Payer: Self-pay | Admitting: Internal Medicine

## 2018-07-04 DIAGNOSIS — IMO0002 Reserved for concepts with insufficient information to code with codable children: Secondary | ICD-10-CM | POA: Insufficient documentation

## 2018-07-04 DIAGNOSIS — K8689 Other specified diseases of pancreas: Secondary | ICD-10-CM | POA: Insufficient documentation

## 2018-07-04 DIAGNOSIS — T82330A Leakage of aortic (bifurcation) graft (replacement), initial encounter: Secondary | ICD-10-CM | POA: Insufficient documentation

## 2018-07-04 DIAGNOSIS — R339 Retention of urine, unspecified: Secondary | ICD-10-CM | POA: Insufficient documentation

## 2018-07-04 DIAGNOSIS — K8591 Acute pancreatitis with uninfected necrosis, unspecified: Secondary | ICD-10-CM | POA: Insufficient documentation

## 2018-07-04 DIAGNOSIS — Z95828 Presence of other vascular implants and grafts: Secondary | ICD-10-CM | POA: Insufficient documentation

## 2018-07-05 ENCOUNTER — Non-Acute Institutional Stay: Payer: Medicare Other | Admitting: Nurse Practitioner

## 2018-07-05 ENCOUNTER — Encounter: Payer: Self-pay | Admitting: Nurse Practitioner

## 2018-07-05 VITALS — Wt 135.0 lb

## 2018-07-05 DIAGNOSIS — R52 Pain, unspecified: Secondary | ICD-10-CM

## 2018-07-05 DIAGNOSIS — R5381 Other malaise: Secondary | ICD-10-CM

## 2018-07-05 DIAGNOSIS — R634 Abnormal weight loss: Secondary | ICD-10-CM

## 2018-07-05 DIAGNOSIS — Z515 Encounter for palliative care: Secondary | ICD-10-CM

## 2018-07-05 DIAGNOSIS — E43 Unspecified severe protein-calorie malnutrition: Secondary | ICD-10-CM

## 2018-07-05 DIAGNOSIS — R630 Anorexia: Secondary | ICD-10-CM

## 2018-07-05 DIAGNOSIS — R0609 Other forms of dyspnea: Secondary | ICD-10-CM

## 2018-07-05 NOTE — Progress Notes (Signed)
PALLIATIVE CARE CONSULT VISIT   PATIENT NAME: [name] DOB: 02/06/1951 MRN: [id number]  PRIMARY CARE PROVIDER:   Bernerd Limbo, MD  REFERRING PROVIDER:  Bernerd Limbo, MD [address] [place],  [id number]  RESPONSIBLE PARTY:   Ronnette Juniper (daughter) [phone]  ROS: oxygen at night at 2l/min; current smoker 1pd for 40 years; no dyspnea on exertion, no wheezing; appetite is improving taking protein supplement; upper ext edema improved; lower ext edema about the same; has lost about 12lbs since admission   ASSESSMENT/PLAN/RECOMMENDATIONS     -deconditioning and weakness due to recent illness/ and hospitalization -patient receiving PT/OT with plans to return to live with her daughter after discharge   -Severe protein caloric malnutrition -decreased appetite/oral intake due to pancreatitis   -anemia (acute UGIB 2/2 esophageal ulcer) -patient reports some slight improvement of appetite -continue protein supplements and Boost -continue remeron and PPI's,vitamins, iron supplements   -Pain/nausea due to recurrent pancreatitis -depression -continue current regimen; gabapentin, hydrocodone -continue cymbalta for pain and depression   AKI on CKD stage V with anasarca  -Urine retention -patient followed by nephrology -continue diurectic's -new A-V fistula not being used at this time -has foley; to f/u with urology as OP   -Mild non-ACS pattern troponin rise, due to demand ischemia. PMH CAD, CABG -Recent endovascular repair of AAA along with common iliac artery stent placement. -HTN Chronic A. Fib not on AC 2/2 UGIB -continue current regimen;statin, plavix,amlodipine, Imdur -denies chest pain -follow up with Cardiology as OP  COPD, chronic hypoxic respiratory failure on oxygen at home  -current tobacco use -continue same -nicotine patch    I spent 60 minutes providing this consultation,  from 10:00 to 11:00. More than 50% of the time in this consultation  was spent coordinating communication.   HISTORY OF PRESENT ILLNESS:  [name] is a 67 y.o. year old female  with medical history significant of AAA, acute cholecystitis s/p cholecystectomy, gallstone pancreatitis, CKD, COPD, CAD, DM, hypertension, NSTEMI, pancreatitis with pseudocyst.  . Palliative Care was asked to help with symptom management and to  address goals of care.   CODE STATUS: Full  PPS: 60% HOSPICE ELIGIBILITY/DIAGNOSIS: TBD  PAST MEDICAL HISTORY:  Past Medical History:  Diagnosis Date  . AAA (abdominal aortic aneurysm) ([place]) 5/09   3.7x3.3 by u/s 2009  . Abnormal EKG    deep TW inversions chronic  . Anemia   . CAD (coronary artery disease)    a. CABG 03/2011 LIMA to LAD, SVG to OM, SVG to PDA. b. cath 06/25/2015 DES to SVG to rPDA, patent LIMA to LAD, patent SVG to OM  . carotid stenosis 5/09   S/p L CEA ;  60-79% bilat ICA by preCABG dopplers 7/12  . CHF (congestive heart failure) ([place])   . Chronic back pain    "all over" (06/24/2015)  . Chronic bronchitis ([place])    "get it pretty much q yr" (06/24/2015)  . CKD (chronic kidney disease)   . Complication of anesthesia 1966   "problem w/ether"  . COPD (chronic obstructive pulmonary disease) ([place])   . Depression   . GERD (gastroesophageal reflux disease)    With hiatal hernia  . GIB (gastrointestinal bleeding) 9/11   S/p EGD with cautery at [facility]  . Heart murmur   . History of blood transfusion "a few times"   "all related to anemia" (06/24/2015)  . History of hiatal hernia   . Hyperlipidemia   . Hypertension   .  Mitral regurgitation    3+ MR by intraoperative TEE;  s/p MV repair with Dr. Roxy Manns 7/12  . Myocardial infarction [facility]) 2012 "several"  . On home oxygen therapy    "2 liters at night; negative for sleep apnea"  . PAD (peripheral artery disease) (HCC)    Severe; s/p bilateral renal artery stents, moderate in-stent restenosis  . Pancreatitis 05/2018  . Paroxysmal atrial fibrillation (HCC)     coumadin;  echo 9/07: EF 60%, mild LVH;  s/p Cox Maze 7/12 with LAA clipping  . Subclavian artery stenosis, left (HCC)    stented by Dr. Trula Slade on 10/18 to help flow of her LIMA to LAD  . Type II diabetes mellitus ([place])     SOCIAL HX:  Social History   Tobacco Use  . Smoking status: Current Every Day Smoker    Years: 50.00    Types: Cigarettes  . Smokeless tobacco: Never Used  . Tobacco comment: 3/4 pk per day  Substance Use Topics  . Alcohol use: Yes    Alcohol/week: 0.0 standard drinks    Comment: occasionally    ALLERGIES:  Allergies  Allergen Reactions  . Isosorbide Other (See Comments)    Can only tolerate in low doses  . Isosorbide Nitrate Other (See Comments)    Can only tolerate in low doses  . Oxycodone-Acetaminophen Itching  . Codeine Itching  . Contrast Media [Iodinated Diagnostic Agents] Itching and Other (See Comments)    Itching of feet  . Ioxaglate Itching and Other (See Comments)    Itching of feet  . Metrizamide Itching and Other (See Comments)    Itching of feet  . Percocet [Oxycodone-Acetaminophen] Itching and Other (See Comments)    Tolerates Hydrocodone     PERTINENT MEDICATIONS:  Outpatient Encounter Medications as of 07/05/2018  Medication Sig  . acetaminophen (TYLENOL) 325 MG tablet Take 325 mg by mouth every 6 (six) hours as needed (for pain).  Marland Kitchen albuterol (PROVENTIL HFA;VENTOLIN HFA) 108 (90 Base) MCG/ACT inhaler Inhale 2 puffs into the lungs every 4 (four) hours as needed for wheezing or shortness of breath. ProAir (Patient taking differently: Inhale 2 puffs into the lungs every 4 (four) hours as needed for wheezing or shortness of breath. )  . allopurinol (ZYLOPRIM) 100 MG tablet Take 100 mg by mouth daily.  . Amino Acids-Protein Hydrolys (FEEDING SUPPLEMENT, PRO-STAT SUGAR FREE 64,) LIQD Take 30 mLs by mouth 3 (three) times daily with meals.  Marland Kitchen amLODipine (NORVASC) 10 MG tablet Take 10 mg by mouth daily.    Marland Kitchen atorvastatin (LIPITOR) 40 MG  tablet Take 40 mg by mouth every evening.   . budesonide-formoterol (SYMBICORT) 160-4.5 MCG/ACT inhaler Inhale 2 puffs into the lungs 2 (two) times daily as needed (for flares).  . carvedilol (COREG) 12.5 MG tablet Take 1 tablet (12.5 mg total) by mouth 2 (two) times daily with a meal.  . Cholecalciferol (VITAMIN D3) 5000 units TABS Take 5,000 Units by mouth daily.  . clopidogrel (PLAVIX) 75 MG tablet Take 1 tablet (75 mg total) by mouth daily with breakfast.  . docusate sodium (COLACE) 100 MG capsule Take 200 mg by mouth daily as needed (when taking pain medications).   . DULoxetine (CYMBALTA) 60 MG capsule Take 60 mg by mouth daily.   . famotidine (PEPCID) 20 MG tablet TAKE 1 TABLET (20 MG TOTAL) BY MOUTH DAILY BEFORE SUPPER. (Patient taking differently: Take 20 mg by mouth daily before supper. )  . fenofibrate 160 MG tablet Take 1 tablet (  160 mg total) by mouth daily. (Patient taking differently: Take 160 mg by mouth at bedtime. )  . ferrous sulfate 325 (65 FE) MG tablet Take 325 mg by mouth 2 (two) times daily with a meal.  . furosemide (LASIX) 40 MG tablet Take 1 tablet (40 mg total) by mouth 2 (two) times daily.  Marland Kitchen gabapentin (NEURONTIN) 300 MG capsule TAKE 1 CAPSULE(300 MG) BY MOUTH TWICE DAILY (Patient taking differently: Take 300 mg by mouth 2 (two) times daily. )  . hydrALAZINE (APRESOLINE) 25 MG tablet Take 1 tablet (25 mg total) by mouth every 8 (eight) hours.  Marland Kitchen HYDROcodone-acetaminophen (NORCO/VICODIN) 5-325 MG tablet Take 1 tablet by mouth every 6 (six) hours as needed for moderate pain.  . isosorbide mononitrate (IMDUR) 30 MG 24 hr tablet Take 0.5 tablets (15 mg total) by mouth daily.  . mirtazapine (REMERON) 7.5 MG tablet Take 7.5 mg by mouth at bedtime.   . nicotine (NICODERM CQ - DOSED IN MG/24 HOURS) 14 mg/24hr patch Place 1 patch (14 mg total) onto the skin daily.  . pantoprazole (PROTONIX) 40 MG tablet Take 1 tablet (40 mg total) by mouth 2 (two) times daily.  . polyethylene  glycol (MIRALAX / GLYCOLAX) packet Take 17 g by mouth daily as needed for mild constipation.  Marland Kitchen spironolactone (ALDACTONE) 25 MG tablet Take 1 tablet (25 mg total) by mouth daily.  . tamsulosin (FLOMAX) 0.4 MG CAPS capsule Take 1 capsule (0.4 mg total) by mouth daily.  . vitamin B-12 100 MCG tablet Take 1 tablet (100 mcg total) by mouth daily.   No facility-administered encounter medications on file as of 07/05/2018.     PHYSICAL EXAM:   General: NAD, Cardiovascular: regular rate and rhythm Pulmonary: clear ant fields Abdomen: soft, nontender, + bowel sounds GU: no suprapubic tenderness, foley present Extremities: 2+ BLE edema, no joint deformities Skin: no rashes of exposed skin Neurological: Weakness but otherwise nonfocal  Ole Lafon G [place], NP

## 2018-07-08 ENCOUNTER — Other Ambulatory Visit: Payer: Self-pay

## 2018-07-08 ENCOUNTER — Encounter (HOSPITAL_COMMUNITY): Payer: Self-pay

## 2018-07-08 ENCOUNTER — Inpatient Hospital Stay (HOSPITAL_COMMUNITY)
Admission: EM | Admit: 2018-07-08 | Discharge: 2018-07-19 | DRG: 438 | Disposition: A | Discharge status: Home Health | Payer: Medicare Other | Attending: Internal Medicine | Admitting: Internal Medicine

## 2018-07-08 ENCOUNTER — Emergency Department (HOSPITAL_COMMUNITY): Payer: Medicare Other

## 2018-07-08 ENCOUNTER — Inpatient Hospital Stay (HOSPITAL_COMMUNITY): Payer: Medicare Other

## 2018-07-08 DIAGNOSIS — Z23 Encounter for immunization: Secondary | ICD-10-CM | POA: Diagnosis present

## 2018-07-08 DIAGNOSIS — I130 Hypertensive heart and chronic kidney disease with heart failure and stage 1 through stage 4 chronic kidney disease, or unspecified chronic kidney disease: Secondary | ICD-10-CM | POA: Diagnosis present

## 2018-07-08 DIAGNOSIS — Z952 Presence of prosthetic heart valve: Secondary | ICD-10-CM

## 2018-07-08 DIAGNOSIS — K859 Acute pancreatitis without necrosis or infection, unspecified: Secondary | ICD-10-CM

## 2018-07-08 DIAGNOSIS — I252 Old myocardial infarction: Secondary | ICD-10-CM | POA: Diagnosis not present

## 2018-07-08 DIAGNOSIS — K449 Diaphragmatic hernia without obstruction or gangrene: Secondary | ICD-10-CM | POA: Diagnosis present

## 2018-07-08 DIAGNOSIS — I2510 Atherosclerotic heart disease of native coronary artery without angina pectoris: Secondary | ICD-10-CM | POA: Diagnosis present

## 2018-07-08 DIAGNOSIS — K851 Biliary acute pancreatitis without necrosis or infection: Secondary | ICD-10-CM | POA: Diagnosis present

## 2018-07-08 DIAGNOSIS — E440 Moderate protein-calorie malnutrition: Secondary | ICD-10-CM | POA: Diagnosis present

## 2018-07-08 DIAGNOSIS — R188 Other ascites: Secondary | ICD-10-CM | POA: Diagnosis not present

## 2018-07-08 DIAGNOSIS — T888XXS Other specified complications of surgical and medical care, not elsewhere classified, sequela: Secondary | ICD-10-CM | POA: Diagnosis not present

## 2018-07-08 DIAGNOSIS — L8992 Pressure ulcer of unspecified site, stage 2: Secondary | ICD-10-CM | POA: Insufficient documentation

## 2018-07-08 DIAGNOSIS — E1122 Type 2 diabetes mellitus with diabetic chronic kidney disease: Secondary | ICD-10-CM | POA: Diagnosis present

## 2018-07-08 DIAGNOSIS — E1136 Type 2 diabetes mellitus with diabetic cataract: Secondary | ICD-10-CM | POA: Diagnosis present

## 2018-07-08 DIAGNOSIS — Z8349 Family history of other endocrine, nutritional and metabolic diseases: Secondary | ICD-10-CM

## 2018-07-08 DIAGNOSIS — Z885 Allergy status to narcotic agent status: Secondary | ICD-10-CM

## 2018-07-08 DIAGNOSIS — K831 Obstruction of bile duct: Secondary | ICD-10-CM | POA: Diagnosis not present

## 2018-07-08 DIAGNOSIS — I509 Heart failure, unspecified: Secondary | ICD-10-CM

## 2018-07-08 DIAGNOSIS — G8929 Other chronic pain: Secondary | ICD-10-CM | POA: Diagnosis present

## 2018-07-08 DIAGNOSIS — Z951 Presence of aortocoronary bypass graft: Secondary | ICD-10-CM | POA: Diagnosis not present

## 2018-07-08 DIAGNOSIS — Z825 Family history of asthma and other chronic lower respiratory diseases: Secondary | ICD-10-CM

## 2018-07-08 DIAGNOSIS — K219 Gastro-esophageal reflux disease without esophagitis: Secondary | ICD-10-CM | POA: Diagnosis present

## 2018-07-08 DIAGNOSIS — Z8719 Personal history of other diseases of the digestive system: Secondary | ICD-10-CM

## 2018-07-08 DIAGNOSIS — E46 Unspecified protein-calorie malnutrition: Secondary | ICD-10-CM | POA: Diagnosis not present

## 2018-07-08 DIAGNOSIS — T888XXA Other specified complications of surgical and medical care, not elsewhere classified, initial encounter: Secondary | ICD-10-CM

## 2018-07-08 DIAGNOSIS — R011 Cardiac murmur, unspecified: Secondary | ICD-10-CM | POA: Diagnosis present

## 2018-07-08 DIAGNOSIS — R0902 Hypoxemia: Secondary | ICD-10-CM | POA: Diagnosis present

## 2018-07-08 DIAGNOSIS — G4733 Obstructive sleep apnea (adult) (pediatric): Secondary | ICD-10-CM | POA: Diagnosis present

## 2018-07-08 DIAGNOSIS — E785 Hyperlipidemia, unspecified: Secondary | ICD-10-CM | POA: Diagnosis present

## 2018-07-08 DIAGNOSIS — Z9981 Dependence on supplemental oxygen: Secondary | ICD-10-CM | POA: Diagnosis not present

## 2018-07-08 DIAGNOSIS — J440 Chronic obstructive pulmonary disease with acute lower respiratory infection: Secondary | ICD-10-CM | POA: Diagnosis not present

## 2018-07-08 DIAGNOSIS — K8020 Calculus of gallbladder without cholecystitis without obstruction: Secondary | ICD-10-CM

## 2018-07-08 DIAGNOSIS — R339 Retention of urine, unspecified: Secondary | ICD-10-CM | POA: Diagnosis present

## 2018-07-08 DIAGNOSIS — L89152 Pressure ulcer of sacral region, stage 2: Secondary | ICD-10-CM | POA: Diagnosis present

## 2018-07-08 DIAGNOSIS — Z6822 Body mass index (BMI) 22.0-22.9, adult: Secondary | ICD-10-CM

## 2018-07-08 DIAGNOSIS — D649 Anemia, unspecified: Secondary | ICD-10-CM | POA: Diagnosis present

## 2018-07-08 DIAGNOSIS — Z9049 Acquired absence of other specified parts of digestive tract: Secondary | ICD-10-CM | POA: Diagnosis not present

## 2018-07-08 DIAGNOSIS — I499 Cardiac arrhythmia, unspecified: Secondary | ICD-10-CM

## 2018-07-08 DIAGNOSIS — Z91041 Radiographic dye allergy status: Secondary | ICD-10-CM

## 2018-07-08 DIAGNOSIS — R1011 Right upper quadrant pain: Secondary | ICD-10-CM | POA: Diagnosis present

## 2018-07-08 DIAGNOSIS — N179 Acute kidney failure, unspecified: Secondary | ICD-10-CM | POA: Diagnosis present

## 2018-07-08 DIAGNOSIS — Z8249 Family history of ischemic heart disease and other diseases of the circulatory system: Secondary | ICD-10-CM

## 2018-07-08 DIAGNOSIS — Z833 Family history of diabetes mellitus: Secondary | ICD-10-CM

## 2018-07-08 DIAGNOSIS — I480 Paroxysmal atrial fibrillation: Secondary | ICD-10-CM | POA: Diagnosis present

## 2018-07-08 DIAGNOSIS — L8942 Pressure ulcer of contiguous site of back, buttock and hip, stage 2: Secondary | ICD-10-CM | POA: Diagnosis not present

## 2018-07-08 DIAGNOSIS — F419 Anxiety disorder, unspecified: Secondary | ICD-10-CM

## 2018-07-08 DIAGNOSIS — Z8679 Personal history of other diseases of the circulatory system: Secondary | ICD-10-CM

## 2018-07-08 DIAGNOSIS — D631 Anemia in chronic kidney disease: Secondary | ICD-10-CM | POA: Diagnosis present

## 2018-07-08 DIAGNOSIS — K9186 Retained cholelithiasis following cholecystectomy: Secondary | ICD-10-CM | POA: Diagnosis not present

## 2018-07-08 DIAGNOSIS — Z90710 Acquired absence of both cervix and uterus: Secondary | ICD-10-CM

## 2018-07-08 DIAGNOSIS — K863 Pseudocyst of pancreas: Secondary | ICD-10-CM | POA: Diagnosis not present

## 2018-07-08 DIAGNOSIS — N184 Chronic kidney disease, stage 4 (severe): Secondary | ICD-10-CM | POA: Diagnosis present

## 2018-07-08 DIAGNOSIS — Z888 Allergy status to other drugs, medicaments and biological substances status: Secondary | ICD-10-CM

## 2018-07-08 DIAGNOSIS — I5033 Acute on chronic diastolic (congestive) heart failure: Secondary | ICD-10-CM | POA: Diagnosis not present

## 2018-07-08 DIAGNOSIS — J189 Pneumonia, unspecified organism: Secondary | ICD-10-CM | POA: Diagnosis not present

## 2018-07-08 DIAGNOSIS — E1151 Type 2 diabetes mellitus with diabetic peripheral angiopathy without gangrene: Secondary | ICD-10-CM | POA: Diagnosis present

## 2018-07-08 DIAGNOSIS — Z7902 Long term (current) use of antithrombotics/antiplatelets: Secondary | ICD-10-CM

## 2018-07-08 DIAGNOSIS — I4510 Unspecified right bundle-branch block: Secondary | ICD-10-CM | POA: Diagnosis present

## 2018-07-08 DIAGNOSIS — Z886 Allergy status to analgesic agent status: Secondary | ICD-10-CM

## 2018-07-08 DIAGNOSIS — R008 Other abnormalities of heart beat: Secondary | ICD-10-CM | POA: Diagnosis present

## 2018-07-08 DIAGNOSIS — F17210 Nicotine dependence, cigarettes, uncomplicated: Secondary | ICD-10-CM | POA: Diagnosis present

## 2018-07-08 LAB — URINALYSIS, ROUTINE W REFLEX MICROSCOPIC
BILIRUBIN URINE: NEGATIVE
GLUCOSE, UA: NEGATIVE mg/dL
HGB URINE DIPSTICK: NEGATIVE
Ketones, ur: NEGATIVE mg/dL
NITRITE: POSITIVE — AB
Protein, ur: NEGATIVE mg/dL
SPECIFIC GRAVITY, URINE: 1.011 (ref 1.005–1.030)
WBC, UA: >50 WBC/hpf — ABNORMAL HIGH (ref 0–5)
pH: 6 (ref 5.0–8.0)

## 2018-07-08 LAB — I-STAT CG4 LACTIC ACID, ED
Lactic Acid, Venous: 1.1 mmol/L (ref 0.5–1.9)
Lactic Acid, Venous: 0.68 mmol/L (ref 0.5–1.9)

## 2018-07-08 LAB — CBC
HEMATOCRIT: 35.3 % — AB (ref 36.0–46.0)
Hemoglobin: 10.6 g/dL — ABNORMAL LOW (ref 12.0–15.0)
MCH: 28.5 pg (ref 26.0–34.0)
MCHC: 30 g/dL (ref 30.0–36.0)
MCV: 94.9 fL (ref 80.0–100.0)
NRBC: 0 % (ref 0.0–0.2)
Platelets: 441 10*3/uL — ABNORMAL HIGH (ref 150–400)
RBC: 3.72 MIL/uL — ABNORMAL LOW (ref 3.87–5.11)
RDW: 17.8 % — AB (ref 11.5–15.5)
WBC: 12 10*3/uL — AB (ref 4.0–10.5)

## 2018-07-08 LAB — COMPREHENSIVE METABOLIC PANEL
ALK PHOS: 71 U/L (ref 38–126)
ALT: 9 U/L (ref 0–44)
AST: 22 U/L (ref 15–41)
Albumin: 3.2 g/dL — ABNORMAL LOW (ref 3.5–5.0)
Anion gap: 15 (ref 5–15)
BILIRUBIN TOTAL: 1.3 mg/dL — AB (ref 0.3–1.2)
BUN: 70 mg/dL — ABNORMAL HIGH (ref 8–23)
CHLORIDE: 96 mmol/L — AB (ref 98–111)
CO2: 31 mmol/L (ref 22–32)
CREATININE: 2.69 mg/dL — AB (ref 0.44–1.00)
Calcium: 9.1 mg/dL (ref 8.9–10.3)
GFR, EST AFRICAN AMERICAN: 20 mL/min — AB (ref >60)
GFR, EST NON AFRICAN AMERICAN: 17 mL/min — AB (ref >60)
Glucose, Bld: 152 mg/dL — ABNORMAL HIGH (ref 70–99)
Potassium: 3.2 mmol/L — ABNORMAL LOW (ref 3.5–5.1)
Sodium: 142 mmol/L (ref 135–145)
Total Protein: 6.7 g/dL (ref 6.5–8.1)

## 2018-07-08 LAB — LIPASE, BLOOD: Lipase: 305 U/L — ABNORMAL HIGH (ref 11–51)

## 2018-07-08 LAB — MRSA PCR SCREENING: MRSA by PCR: NEGATIVE

## 2018-07-08 LAB — TROPONIN I: Troponin I: 0.09 ng/mL (ref <0.03)

## 2018-07-08 MED ORDER — CARVEDILOL 12.5 MG PO TABS
12.5000 mg | ORAL_TABLET | Freq: Two times a day (BID) | ORAL | Status: DC
  Administered 2018-07-08 – 2018-07-19 (×22): 12.5 mg via ORAL
  Filled 2018-07-08 (×22): qty 1

## 2018-07-08 MED ORDER — ONDANSETRON HCL 4 MG/2ML IJ SOLN: 4.0000 mg | Freq: Four times a day (QID) | INTRAMUSCULAR | Status: DC | PRN

## 2018-07-08 MED ORDER — ALBUTEROL SULFATE (2.5 MG/3ML) 0.083% IN NEBU: 2.5000 mg | INHALATION_SOLUTION | RESPIRATORY_TRACT | Status: DC | PRN

## 2018-07-08 MED ORDER — ATORVASTATIN CALCIUM 40 MG PO TABS
40.0000 mg | ORAL_TABLET | Freq: Every evening | ORAL | Status: DC
  Administered 2018-07-08 – 2018-07-18 (×11): 40 mg via ORAL
  Filled 2018-07-08 (×11): qty 1

## 2018-07-08 MED ORDER — AMLODIPINE BESYLATE 10 MG PO TABS
10.0000 mg | ORAL_TABLET | Freq: Every day | ORAL | Status: DC
  Administered 2018-07-09 – 2018-07-19 (×11): 10 mg via ORAL
  Filled 2018-07-08 (×11): qty 1

## 2018-07-08 MED ORDER — POTASSIUM CHLORIDE CRYS ER 20 MEQ PO TBCR
20.0000 meq | EXTENDED_RELEASE_TABLET | Freq: Two times a day (BID) | ORAL | Status: DC
  Administered 2018-07-09 – 2018-07-12 (×7): 20 meq via ORAL
  Filled 2018-07-08 (×7): qty 1

## 2018-07-08 MED ORDER — HYDROMORPHONE HCL 1 MG/ML IJ SOLN: 0.5000 mg | INTRAMUSCULAR | Status: DC | PRN

## 2018-07-08 MED ORDER — PNEUMOCOCCAL VAC POLYVALENT 25 MCG/0.5ML IJ INJ
0.5000 mL | INJECTION | INTRAMUSCULAR | Status: AC
  Administered 2018-07-09: 0.5 mL via INTRAMUSCULAR
  Filled 2018-07-08: qty 0.5

## 2018-07-08 MED ORDER — HYDROMORPHONE HCL 1 MG/ML IJ SOLN
0.5000 mg | Freq: Once | INTRAMUSCULAR | Status: AC
  Administered 2018-07-08: 0.5 mg via INTRAVENOUS
  Filled 2018-07-08: qty 1

## 2018-07-08 MED ORDER — MOMETASONE FURO-FORMOTEROL FUM 200-5 MCG/ACT IN AERO
2.0000 | INHALATION_SPRAY | Freq: Two times a day (BID) | RESPIRATORY_TRACT | Status: DC
  Administered 2018-07-09 – 2018-07-19 (×19): 2 via RESPIRATORY_TRACT
  Filled 2018-07-08 (×2): qty 8.8

## 2018-07-08 MED ORDER — ALPRAZOLAM 0.5 MG PO TABS
0.5000 mg | ORAL_TABLET | Freq: Every day | ORAL | Status: DC
  Administered 2018-07-08 – 2018-07-18 (×11): 0.5 mg via ORAL
  Filled 2018-07-08 (×11): qty 1

## 2018-07-08 MED ORDER — MIRTAZAPINE 15 MG PO TABS
7.5000 mg | ORAL_TABLET | Freq: Every day | ORAL | Status: DC
  Administered 2018-07-08 – 2018-07-18 (×11): 7.5 mg via ORAL
  Filled 2018-07-08 (×11): qty 1

## 2018-07-08 MED ORDER — FAMOTIDINE 20 MG PO TABS
20.0000 mg | ORAL_TABLET | Freq: Every day | ORAL | Status: DC
  Administered 2018-07-08 – 2018-07-13 (×6): 20 mg via ORAL
  Filled 2018-07-08 (×6): qty 1

## 2018-07-08 MED ORDER — SODIUM CHLORIDE 0.9 % IV BOLUS
500.0000 mL | Freq: Once | INTRAVENOUS | Status: AC
  Administered 2018-07-08: 500 mL via INTRAVENOUS

## 2018-07-08 MED ORDER — FUROSEMIDE 40 MG PO TABS
40.0000 mg | ORAL_TABLET | Freq: Two times a day (BID) | ORAL | Status: DC
  Administered 2018-07-09 – 2018-07-16 (×15): 40 mg via ORAL
  Filled 2018-07-08 (×15): qty 1

## 2018-07-08 MED ORDER — HEPARIN SODIUM (PORCINE) 5000 UNIT/ML IJ SOLN
5000.0000 [IU] | Freq: Three times a day (TID) | INTRAMUSCULAR | Status: AC
  Administered 2018-07-08 – 2018-07-11 (×10): 5000 [IU] via SUBCUTANEOUS
  Filled 2018-07-08 (×10): qty 1

## 2018-07-08 MED ORDER — ORAL CARE MOUTH RINSE
15.0000 mL | Freq: Two times a day (BID) | OROMUCOSAL | Status: DC
  Administered 2018-07-08 – 2018-07-18 (×14): 15 mL via OROMUCOSAL

## 2018-07-08 MED ORDER — CLOPIDOGREL BISULFATE 75 MG PO TABS
75.0000 mg | ORAL_TABLET | Freq: Every day | ORAL | Status: DC
  Filled 2018-07-08 (×3): qty 1

## 2018-07-08 MED ORDER — HYDROMORPHONE HCL 1 MG/ML IJ SOLN
1.0000 mg | INTRAMUSCULAR | Status: DC | PRN
  Administered 2018-07-08 – 2018-07-11 (×14): 1 mg via INTRAVENOUS
  Filled 2018-07-08 (×14): qty 1

## 2018-07-08 MED ORDER — POTASSIUM CHLORIDE CRYS ER 20 MEQ PO TBCR
40.0000 meq | EXTENDED_RELEASE_TABLET | Freq: Once | ORAL | Status: AC
  Administered 2018-07-08: 40 meq via ORAL
  Filled 2018-07-08: qty 2

## 2018-07-08 MED ORDER — INFLUENZA VAC SPLIT HIGH-DOSE 0.5 ML IM SUSY
0.5000 mL | PREFILLED_SYRINGE | INTRAMUSCULAR | Status: AC
  Administered 2018-07-09: 0.5 mL via INTRAMUSCULAR
  Filled 2018-07-08: qty 0.5

## 2018-07-08 MED ORDER — FUROSEMIDE 10 MG/ML IJ SOLN
40.0000 mg | Freq: Once | INTRAMUSCULAR | Status: AC
  Administered 2018-07-08: 40 mg via INTRAVENOUS
  Filled 2018-07-08: qty 4

## 2018-07-08 MED ORDER — HYDRALAZINE HCL 25 MG PO TABS
25.0000 mg | ORAL_TABLET | Freq: Three times a day (TID) | ORAL | Status: DC
  Administered 2018-07-08 – 2018-07-19 (×34): 25 mg via ORAL
  Filled 2018-07-08 (×34): qty 1

## 2018-07-08 MED ORDER — BISACODYL 10 MG RE SUPP: 10.0000 mg | Freq: Every day | RECTAL | Status: DC | PRN

## 2018-07-08 MED ORDER — NICOTINE 14 MG/24HR TD PT24
14.0000 mg | MEDICATED_PATCH | Freq: Every day | TRANSDERMAL | Status: DC
  Administered 2018-07-08 – 2018-07-18 (×11): 14 mg via TRANSDERMAL
  Filled 2018-07-08 (×12): qty 1

## 2018-07-08 MED ORDER — ISOSORBIDE MONONITRATE ER 30 MG PO TB24
15.0000 mg | ORAL_TABLET | Freq: Every day | ORAL | Status: DC
  Administered 2018-07-09 – 2018-07-19 (×11): 15 mg via ORAL
  Filled 2018-07-08 (×11): qty 1

## 2018-07-08 MED ORDER — ONDANSETRON HCL 4 MG PO TABS
4.0000 mg | ORAL_TABLET | Freq: Four times a day (QID) | ORAL | Status: DC | PRN
  Administered 2018-07-18: 4 mg via ORAL
  Filled 2018-07-08: qty 1

## 2018-07-08 NOTE — ED Notes (Signed)
ED TO INPATIENT HANDOFF REPORT  Name/Age/Gender [name] 67 y.o. female  Code Status Code Status History    Date Active Date Inactive Code Status Order ID Comments User Context   05/28/2018 2223 06/26/2018 1524 Full Code [id number]  Ivor Costa, MD ED   04/10/2018 0001 04/21/2018 1936 Full Code [id number]  Vilma Prader, MD ED   01/16/2018 2354 01/17/2018 2117 Full Code [id number]  Rosetta Posner, MD Inpatient   10/06/2015 2227 10/08/2015 1814 Full Code [id number]  Norval Morton, MD Inpatient   07/04/2015 0450 07/04/2015 1821 Full Code [id number]  Edwin Dada, MD Inpatient   06/25/2015 1611 06/26/2015 1745 Full Code [id number]  Sherren Mocha, MD Inpatient   06/24/2015 1114 06/25/2015 1611 Full Code [id number]  Serafina Mitchell, MD Inpatient   06/24/2015 1048 06/24/2015 1114 Full Code [id number]  Leonie Man, MD Inpatient      Home/SNF/Other Rehab  Chief Complaint abdominal pain  Level of Care/Admitting Diagnosis ED Disposition    ED Disposition Condition [facility] Area: [facility] [[id number]]  Level of Care: Telemetry [5]  Admit to tele based on following criteria: Complex arrhythmia (Bradycardia/Tachycardia)  Diagnosis: RUQ pain [656812]  Admitting Physician: Mariel Aloe [phone]  Attending Physician: Mariel Aloe [phone]  Estimated length of stay: past midnight tomorrow  Certification:: I certify this patient will need inpatient services for at least 2 midnights  PT Class (Do Not Modify): Inpatient [101]  PT Acc Code (Do Not Modify): Private [1]       Medical History Past Medical History:  Diagnosis Date  . AAA (abdominal aortic aneurysm) ([place]) 5/09   3.7x3.3 by u/s 2009  . Abnormal EKG    deep TW inversions chronic  . Anemia   . CAD (coronary artery disease)    a. CABG 03/2011 LIMA to LAD, SVG to OM, SVG to PDA. b. cath 06/25/2015 DES to SVG to rPDA, patent LIMA to LAD, patent SVG to OM  . carotid stenosis 5/09    S/p L CEA ;  60-79% bilat ICA by preCABG dopplers 7/12  . CHF (congestive heart failure) ([place])   . Chronic back pain    "all over" (06/24/2015)  . Chronic bronchitis ([place])    "get it pretty much q yr" (06/24/2015)  . CKD (chronic kidney disease)   . Complication of anesthesia 1966   "problem w/ether"  . COPD (chronic obstructive pulmonary disease) ([place])   . Depression   . GERD (gastroesophageal reflux disease)    With hiatal hernia  . GIB (gastrointestinal bleeding) 9/11   S/p EGD with cautery at [facility]  . Heart murmur   . History of blood transfusion "a few times"   "all related to anemia" (06/24/2015)  . History of hiatal hernia   . Hyperlipidemia   . Hypertension   . Mitral regurgitation    3+ MR by intraoperative TEE;  s/p MV repair with Dr. Roxy Manns 7/12  . Myocardial infarction [facility]) 2012 "several"  . On home oxygen therapy    "2 liters at night; negative for sleep apnea"  . PAD (peripheral artery disease) (HCC)    Severe; s/p bilateral renal artery stents, moderate in-stent restenosis  . Pancreatitis 05/2018  . Paroxysmal atrial fibrillation (HCC)    coumadin;  echo 9/07: EF 60%, mild LVH;  s/p Cox Maze 7/12 with LAA clipping  . Subclavian artery stenosis, left ([place])    stented by Dr. Trula Slade on 10/18 to help  flow of her LIMA to LAD  . Type II diabetes mellitus (HCC)     Allergies Allergies  Allergen Reactions  . Isosorbide Other (See Comments)    Can only tolerate in low doses  . Isosorbide Nitrate Other (See Comments)    Can only tolerate in low doses  . Oxycodone-Acetaminophen Itching  . Codeine Itching  . Contrast Media [Iodinated Diagnostic Agents] Itching and Other (See Comments)    Itching of feet  . Ioxaglate Itching and Other (See Comments)    Itching of feet  . Metrizamide Itching and Other (See Comments)    Itching of feet  . Percocet [Oxycodone-Acetaminophen] Itching and Other (See Comments)    Tolerates Hydrocodone    IV  Location/Drains/Wounds Patient Lines/Drains/Airways Status   Active Line/Drains/Airways    Name:   Placement date:   Placement time:   Site:   Days:   Peripheral IV 07/08/18 Right Forearm   07/08/18    1156    Forearm   less than 1   Fistula / Graft Left Forearm Arteriovenous fistula   11/03/17    0955    Forearm   247   Fistula / Graft Left Upper arm Arteriovenous fistula   -    -    Upper arm      Rectal Tube/Pouch   06/18/18    0000    -   20   Urethral Catheter Belinda Bowen RN   06/22/18    1830    -   16   Pressure Injury 06/15/18 Stage I -  Intact skin with non-blanchable redness of a localized area usually over a bony prominence. moisture associated skin damage related to frequent diarrhea   06/15/18    1115     23   Pressure Injury Stage II -  Partial thickness loss of dermis presenting as a shallow open ulcer with a red, pink wound bed without slough.   -    -               Labs/Imaging Results for orders placed or performed during the hospital encounter of 07/08/18 (from the past 48 hour(s))  Lipase, blood     Status: Abnormal   Collection Time: 07/08/18 11:52 AM  Result Value Ref Range   Lipase 305 (H) 11 - 51 U/L    Comment: Performed at [facility], [place] [address] [place], [place] [address]  Comprehensive metabolic panel     Status: Abnormal   Collection Time: 07/08/18 11:52 AM  Result Value Ref Range   Sodium 142 135 - 145 mmol/L   Potassium 3.2 (L) 3.5 - 5.1 mmol/L   Chloride 96 (L) 98 - 111 mmol/L   CO2 31 22 - 32 mmol/L   Glucose, Bld 152 (H) 70 - 99 mg/dL   BUN 70 (H) 8 - 23 mg/dL   Creatinine, Ser 2.69 (H) 0.44 - 1.00 mg/dL   Calcium 9.1 8.9 - 10.3 mg/dL   Total Protein 6.7 6.5 - 8.1 g/dL   Albumin 3.2 (L) 3.5 - 5.0 g/dL   AST 22 15 - 41 U/L   ALT 9 0 - 44 U/L   Alkaline Phosphatase 71 38 - 126 U/L   Total Bilirubin 1.3 (H) 0.3 - 1.2 mg/dL   GFR calc non Af Amer 17 (L) >60 mL/min   GFR calc Af Amer 20 (L) >60 mL/min    Comment:  (NOTE) The eGFR has been calculated using the CKD EPI  equation. This calculation has not been validated in all clinical situations. eGFR's persistently <60 mL/min signify possible Chronic Kidney Disease.    Anion gap 15 5 - 15    Comment: Performed at [facility], [place] [address], [place], [place] [address]  CBC     Status: Abnormal   Collection Time: 07/08/18 11:52 AM  Result Value Ref Range   WBC 12.0 (H) 4.0 - 10.5 K/uL   RBC 3.72 (L) 3.87 - 5.11 MIL/uL   Hemoglobin 10.6 (L) 12.0 - 15.0 g/dL   HCT 35.3 (L) 36.0 - 46.0 %   MCV 94.9 80.0 - 100.0 fL   MCH 28.5 26.0 - 34.0 pg   MCHC 30.0 30.0 - 36.0 g/dL   RDW 17.8 (H) 11.5 - 15.5 %   Platelets 441 (H) 150 - 400 K/uL   nRBC 0.0 0.0 - 0.2 %    Comment: Performed at [facility], [place] [address], [place], [place] [address]  Troponin I     Status: Abnormal   Collection Time: 07/08/18 11:52 AM  Result Value Ref Range   Troponin I 0.09 (HH) <0.03 ng/mL    Comment: CRITICAL RESULT CALLED TO, READ BACK BY AND VERIFIED WITH: dowd,p rn [id number] covington,n Performed at [facility], [place] [address], [place], [place] [address]   I-Stat CG4 Lactic Acid, ED     Status: None   Collection Time: 07/08/18 12:01 PM  Result Value Ref Range   Lactic Acid, Venous 1.10 0.5 - 1.9 mmol/L  Urinalysis, Routine w reflex microscopic     Status: Abnormal   Collection Time: 07/08/18  1:35 PM  Result Value Ref Range   Color, Urine YELLOW YELLOW   APPearance HAZY (A) CLEAR   Specific Gravity, Urine 1.011 1.005 - 1.030   pH 6.0 5.0 - 8.0   Glucose, UA NEGATIVE NEGATIVE mg/dL   Hgb urine dipstick NEGATIVE NEGATIVE   Bilirubin Urine NEGATIVE NEGATIVE   Ketones, ur NEGATIVE NEGATIVE mg/dL   Protein, ur NEGATIVE NEGATIVE mg/dL   Nitrite POSITIVE (A) NEGATIVE   Leukocytes, UA LARGE (A) NEGATIVE   RBC / HPF 0-5 0 - 5 RBC/hpf   WBC, UA >50 (H) 0 - 5 WBC/hpf   Bacteria, UA FEW (A) NONE SEEN    Squamous Epithelial / LPF 0-5 0 - 5   Mucus PRESENT     Comment: Performed at [facility], [place] [address] [place], [place] [address]  I-Stat CG4 Lactic Acid, ED     Status: None   Collection Time: 07/08/18  2:04 PM  Result Value Ref Range   Lactic Acid, Venous 0.68 0.5 - 1.9 mmol/L   Dg Abdomen Acute W/chest  Result Date: 07/08/2018 CLINICAL DATA:  Right lower quadrant pain. EXAM: DG ABDOMEN ACUTE W/ 1V CHEST COMPARISON:  Chest x-ray dated June 17, 2018. Abdominal x-ray dated June 15, 2018. FINDINGS: Stable cardiomegaly status post CABG, aortic valve replacement, and left atrial appendage clipping. Worsened pulmonary vascular congestion with new mild interstitial edema. No focal consolidation, pleural effusion, or pneumothorax. There is no evidence of dilated bowel loops or free intraperitoneal air. No radiopaque calculi or other significant radiographic abnormality is seen. Unchanged aorto bi-iliac stent graft and left renal artery stent. No acute osseous abnormality. IMPRESSION: 1. Worsening pulmonary vascular congestion with new mild interstitial pulmonary edema. 2. No acute abdominal finding. Electronically Signed   By: Titus Dubin M.D.   On: 07/08/2018 12:41   EKG Interpretation  Date/Time:  Saturday July 08 2018 11:44:26 EDT Ventricular Rate:  97 PR Interval:    QRS Duration: 124 QT Interval:  426 QTC Calculation: 483 R Axis:   0 Text Interpretation:  Sinus rhythm Ventricular bigeminy Right bundle branch block Confirmed by Davonna Belling [phone]) on 07/08/2018 1:19:55 PM   Pending Labs Unresulted Labs (From admission, onward)    Start     Ordered   Signed and Held  Comprehensive metabolic panel  Tomorrow morning,   R     Signed and Held   Signed and Held  CBC  Tomorrow morning,   R     Signed and Held          Vitals/Pain Today's Vitals   07/08/18 1445 07/08/18 1447 07/08/18 1447 07/08/18 1530  BP:  (!) 164/64  (!) 153/58  Pulse:  90   90  Resp:  10  12  Temp:      TempSrc:      SpO2: 93% 93%  94%  Weight:      Height:      PainSc:   7      Isolation Precautions No active isolations  Medications Medications  HYDROmorphone (DILAUDID) injection 0.5 mg (0.5 mg Intravenous Given 07/08/18 1357)  sodium chloride 0.9 % bolus 500 mL (0 mLs Intravenous Stopped 07/08/18 1447)    Mobility walks with device

## 2018-07-08 NOTE — ED Notes (Signed)
Patient transported to X-ray 

## 2018-07-08 NOTE — Plan of Care (Signed)
  Problem: Pain Managment: Goal: General experience of comfort will improve Outcome: Progressing   

## 2018-07-08 NOTE — ED Triage Notes (Signed)
Patient arrived via [facility] from [facility]. Patient is AOx4 and ambulatory with 4 point walker. Patient chief complaint today is Lower Right Quadrant Abdominal pain. Patient states she has Hx of Pancreatitis and this feels like Pancreatitis "flare up". Patient does have Indwelling urinary cath.   Pain is 8/10 Pulse - 98, Irregular A-Fib O2 - 98 on Room Air (uses 2L Nasal Canula at night) BP - 198/68 Temp - 100.00 Oral  Respirations- 18

## 2018-07-08 NOTE — Plan of Care (Signed)
Patient verbalized improved pain control with increase in Dilaudid to 1 mg.  No nausea or vomiting since arriving on [place].

## 2018-07-08 NOTE — ED Notes (Signed)
Changed catheter bag and tubing and now waiting for fresh urine to send down to the lab

## 2018-07-08 NOTE — ED Notes (Signed)
Pt ambulated to the bathroom without assistance. 

## 2018-07-08 NOTE — ED Notes (Signed)
Bed: AC45 Expected date:  Expected time:  Means of arrival:  Comments: 67 yo abd pain

## 2018-07-08 NOTE — H&P (Signed)
History and Physical    MIU CHIONG DVV:[id number] DOB: December 08, 1950 DOA: 07/08/2018  PCP: Bernerd Limbo, MD Patient coming from: SNF  Chief Complaint: RUQ abdominal pain  HPI: [name] is a 67 y.o. female with medical history significant of AAA, acute cholecystitis s/p cholecystectomy, gallstone pancreatitis, CKD, COPD, CAD, DM, hypertension, NSTEMI, pancreatitis with pseudocyst.  Patient presented to the ED secondary to sharp right upper quadrant pain with no radiation.  She reports that this is similar to her previous presentations to the hospital about a month ago where she had acute cholecystitis and pancreatitis.  She has some nausea  ED Course: Vitals: Afebrile, normal pulse and respirations. BP 160/50s, 2 L via nasal cannula. Labs: Potassium of 3.2, glucose of 152, creatinine of 2.69, BUN of 70, lipase of 305, total bilirubin of 1.3, WBC of 12, hemoglobin of 10.6, platelets 441 Imaging: Chest and abdominal x-ray significant for worsening pulmonary edema and vascular congestion Medications/Course: IV fluid bolus given the emergency department in addition to 0.5 mg of Dilaudid  Review of Systems: Review of Systems  Constitutional: Positive for diaphoresis. Negative for chills and fever.  Respiratory: Negative for cough and shortness of breath.   Cardiovascular: Negative for chest pain and palpitations.  Gastrointestinal: Positive for abdominal pain, nausea and vomiting. Negative for constipation and diarrhea.  All other systems reviewed and are negative.   Past Medical History:  Diagnosis Date  . AAA (abdominal aortic aneurysm) ([place]) 5/09   3.7x3.3 by u/s 2009  . Abnormal EKG    deep TW inversions chronic  . Anemia   . CAD (coronary artery disease)    a. CABG 03/2011 LIMA to LAD, SVG to OM, SVG to PDA. b. cath 06/25/2015 DES to SVG to rPDA, patent LIMA to LAD, patent SVG to OM  . carotid stenosis 5/09   S/p L CEA ;  60-79% bilat ICA by preCABG dopplers 7/12  . CHF  (congestive heart failure) ([place])   . Chronic back pain    "all over" (06/24/2015)  . Chronic bronchitis ([place])    "get it pretty much q yr" (06/24/2015)  . CKD (chronic kidney disease)   . Complication of anesthesia 1966   "problem w/ether"  . COPD (chronic obstructive pulmonary disease) ([place])   . Depression   . GERD (gastroesophageal reflux disease)    With hiatal hernia  . GIB (gastrointestinal bleeding) 9/11   S/p EGD with cautery at [facility]  . Heart murmur   . History of blood transfusion "a few times"   "all related to anemia" (06/24/2015)  . History of hiatal hernia   . Hyperlipidemia   . Hypertension   . Mitral regurgitation    3+ MR by intraoperative TEE;  s/p MV repair with Dr. Roxy Manns 7/12  . Myocardial infarction [facility]) 2012 "several"  . On home oxygen therapy    "2 liters at night; negative for sleep apnea"  . PAD (peripheral artery disease) ([facility])    Severe; s/p bilateral renal artery stents, moderate in-stent restenosis  . Pancreatitis 05/2018  . Paroxysmal atrial fibrillation ([facility])    coumadin;  echo 9/07: EF 60%, mild LVH;  s/p Cox Maze 7/12 with LAA clipping  . Subclavian artery stenosis, left ([facility])    stented by Dr. Trula Slade on 10/18 to help flow of her LIMA to LAD  . Type II diabetes mellitus ([place])     Past Surgical History:  Procedure Laterality Date  . A/V FISTULAGRAM Left 03/21/2018   Procedure: A/V FISTULAGRAM;  Surgeon: Serafina Mitchell, MD;  Location: [place] [facility];  Service: Cardiovascular;  Laterality: Left;  . ABDOMINAL AORTIC ENDOVASCULAR STENT GRAFT N/A 01/17/2018   Procedure: ABDOMINAL AORTIC ENDOVASCULAR STENT GRAFT WITH CO2;  Surgeon: Serafina Mitchell, MD;  Location: [place];  Service: Vascular;  Laterality: N/A;  . ABDOMINAL HYSTERECTOMY  1990's  . APPENDECTOMY  Aug. 11, 2016   Ruptured  . AV FISTULA PLACEMENT Left 11/03/2017   Procedure: ARTERIOVENOUS (AV) FISTULA CREATION LEFT ARM;  Surgeon: Serafina Mitchell, MD;  Location: [place];  Service:  Vascular;  Laterality: Left;  . BOWEL RESECTION  2013  . CARDIAC CATHETERIZATION N/A 06/24/2015   Procedure: Left Heart Cath and Cors/Grafts Angiography;  Surgeon: Leonie Man, MD;  Location: [place] [facility];  Service: Cardiovascular;  Laterality: N/A;  . CARDIAC CATHETERIZATION N/A 06/25/2015   Procedure: Coronary Stent Intervention;  Surgeon: Sherren Mocha, MD;  Location: [place] [facility];  Service: Cardiovascular;  Laterality: N/A;  . CAROTID ENDARTERECTOMY Left   . CATARACT EXTRACTION Right 03/27/2018  . CHOLECYSTECTOMY N/A 04/19/2018   Procedure: LAPAROSCOPIC CHOLECYSTECTOMY WITH INTRAOPERATIVE CHOLANGIOGRAM;  Surgeon: Rolm Bookbinder, MD;  Location: [place];  Service: General;  Laterality: N/A;  . CORONARY ANGIOPLASTY    . CORONARY ARTERY BYPASS GRAFT  03/05/2011   CABG X3 (LIMA to LAD, SVG to OM, SVG to PDA, EVH via left thigh  . CORONARY STENT PLACEMENT    . ESOPHAGOGASTRODUODENOSCOPY N/A 04/12/2018   Procedure: ESOPHAGOGASTRODUODENOSCOPY (EGD);  Surgeon: [name], MD;  Location: [facility] END[facility];  Service: Endoscopy;  Laterality: N/A;  . ESOPHAGOGASTRODUODENOSCOPY N/A 06/16/2018   Procedure: ESOPHAGOGASTRODUODENOSCOPY (EGD);  Surgeon: Thornton Park, MD;  Location: [place];  Service: Gastroenterology;  Laterality: N/A;  . LACERATION REPAIR Right 1990's   WRIST  . MAZE  03/05/2011   complete biatrial lesion set with clipping of LA appendage  . MITRAL VALVE REPAIR  03/05/2011   25mm Memo 3D ring annuloplasty for ischemic MR  . PERIPHERAL VASCULAR BALLOON ANGIOPLASTY Left 03/21/2018   Procedure: PERIPHERAL VASCULAR BALLOON ANGIOPLASTY;  Surgeon: Serafina Mitchell, MD;  Location: [place] [facility];  Service: Cardiovascular;  Laterality: Left;  . PERIPHERAL VASCULAR CATHETERIZATION N/A 06/24/2015   Procedure: Aortic Arch Angiography;  Surgeon: Serafina Mitchell, MD;  Location: [place] [facility];  Service: Cardiovascular;  Laterality: N/A;  . PERIPHERAL VASCULAR  CATHETERIZATION  06/24/2015   Procedure: Peripheral Vascular Intervention;  Surgeon: Serafina Mitchell, MD;  Location: [place] [facility];  Service: Cardiovascular;;  . PERIPHERAL VASCULAR CATHETERIZATION N/A 06/24/2015   Procedure: Abdominal Aortogram;  Surgeon: Serafina Mitchell, MD;  Location: [place] [facility];  Service: Cardiovascular;  Laterality: N/A;  . RENAL ANGIOGRAM Bilateral 09/11/2013   Procedure: RENAL ANGIOGRAM;  Surgeon: Serafina Mitchell, MD;  Location: [facility] C[facility] LAB;  Service: Cardiovascular;  Laterality: Bilateral;  . RIGHT FEMORAL-POPLITEAL BYPASS       reports that she has been smoking cigarettes. She has smoked for the past 50.00 years. She has never used smokeless tobacco. She reports that she drinks alcohol. She reports that she has current or past drug history. Drug: Marijuana.  Allergies  Allergen Reactions  . Isosorbide Other (See Comments)    Can only tolerate in low doses  . Isosorbide Nitrate Other (See Comments)    Can only tolerate in low doses  . Oxycodone-Acetaminophen Itching  . Codeine Itching  . Contrast Media [Iodinated Diagnostic Agents] Itching and Other (See Comments)    Itching  of feet  . Ioxaglate Itching and Other (See Comments)    Itching of feet  . Metrizamide Itching and Other (See Comments)    Itching of feet  . Percocet [Oxycodone-Acetaminophen] Itching and Other (See Comments)    Tolerates Hydrocodone    Family History  Problem Relation Age of Onset  . Emphysema Mother   . Heart disease Mother        before age 10  . Hypertension Mother   . Hyperlipidemia Mother   . Heart attack Mother   . AAA (abdominal aortic aneurysm) Mother        rupture  . Heart attack Father 13  . Emphysema Father   . Heart disease Father        before age 48  . Hyperlipidemia Father   . Hypertension Father   . Peripheral vascular disease Father   . Diabetes Brother   . Heart disease Brother        before age 27  . Hyperlipidemia Brother   .  Hypertension Brother   . Heart attack Brother        CABG  . Heart disease Other        Vascular disease in grandparents, uncles and dad  . Colon cancer Neg Hx     Prior to Admission medications   Medication Sig Start Date End Date Taking? Authorizing Provider  acetaminophen (TYLENOL) 325 MG tablet Take 325 mg by mouth every 6 (six) hours as needed (for pain).   Yes [provider]  albuterol (PROVENTIL HFA;VENTOLIN HFA) 108 (90 Base) MCG/ACT inhaler Inhale 2 puffs into the lungs every 4 (four) hours as needed for wheezing or shortness of breath. ProAir Patient taking differently: Inhale 2 puffs into the lungs every 4 (four) hours as needed for wheezing or shortness of breath.  10/08/15  Yes Delfina Redwood, MD  allopurinol (ZYLOPRIM) 100 MG tablet Take 100 mg by mouth daily. 02/02/17  Yes [provider]  ALPRAZolam Duanne Moron) 0.5 MG tablet Take 0.5 mg by mouth at bedtime.   Yes [provider]  Amino Acids-Protein Hydrolys (FEEDING SUPPLEMENT, PRO-STAT SUGAR FREE 64,) LIQD Take 30 mLs by mouth 3 (three) times daily with meals. 06/26/18  Yes Thurnell Lose, MD  amLODipine (NORVASC) 10 MG tablet Take 10 mg by mouth daily.     Yes [provider]  atorvastatin (LIPITOR) 40 MG tablet Take 40 mg by mouth every evening.  12/07/17  Yes [provider]  budesonide-formoterol (SYMBICORT) 160-4.5 MCG/ACT inhaler Inhale 2 puffs into the lungs 2 (two) times daily as needed (for flares).   Yes [provider]  carvedilol (COREG) 12.5 MG tablet Take 1 tablet (12.5 mg total) by mouth 2 (two) times daily with a meal. 06/26/18  Yes Thurnell Lose, MD  Cholecalciferol (VITAMIN D3) 5000 units TABS Take 5,000 Units by mouth daily.   Yes [provider]  clopidogrel (PLAVIX) 75 MG tablet Take 1 tablet (75 mg total) by mouth daily with breakfast. 06/26/15  Yes Almyra Deforest, PA  docusate sodium (COLACE) 100 MG capsule Take 200 mg by mouth daily as needed  (when taking pain medications).    Yes [provider]  DULoxetine (CYMBALTA) 60 MG capsule Take 60 mg by mouth daily.  10/26/17  Yes [provider]  famotidine (PEPCID) 20 MG tablet TAKE 1 TABLET (20 MG TOTAL) BY MOUTH DAILY BEFORE SUPPER. Patient taking differently: Take 20 mg by mouth daily before supper.  01/27/16  Yes  Sherren Mocha, MD  feeding supplement (BOOST / RESOURCE BREEZE) LIQD Take 1 Container by mouth 2 (two) times daily.   Yes [provider]  fenofibrate 160 MG tablet Take 1 tablet (160 mg total) by mouth daily. Patient taking differently: Take 160 mg by mouth at bedtime.  10/08/15  Yes Delfina Redwood, MD  ferrous sulfate 325 (65 FE) MG tablet Take 325 mg by mouth 2 (two) times daily with a meal.   Yes [provider]  furosemide (LASIX) 40 MG tablet Take 1 tablet (40 mg total) by mouth 2 (two) times daily. 06/26/18  Yes Thurnell Lose, MD  gabapentin (NEURONTIN) 300 MG capsule TAKE 1 CAPSULE(300 MG) BY MOUTH TWICE DAILY Patient taking differently: Take 300 mg by mouth 2 (two) times daily.  05/05/18  Yes Serafina Mitchell, MD  hydrALAZINE (APRESOLINE) 25 MG tablet Take 1 tablet (25 mg total) by mouth every 8 (eight) hours. 06/26/18  Yes Thurnell Lose, MD  HYDROcodone-acetaminophen (NORCO/VICODIN) 5-325 MG tablet Take 1 tablet by mouth every 6 (six) hours as needed for moderate pain. 06/26/18  Yes Thurnell Lose, MD  isosorbide mononitrate (IMDUR) 30 MG 24 hr tablet Take 0.5 tablets (15 mg total) by mouth daily. 12/02/15  Yes Sherren Mocha, MD  mirtazapine (REMERON) 7.5 MG tablet Take 7.5 mg by mouth at bedtime.  02/15/18 08/14/18 Yes [provider]  nicotine (NICODERM CQ - DOSED IN MG/24 HOURS) 14 mg/24hr patch Place 1 patch (14 mg total) onto the skin daily. 06/26/18  Yes Thurnell Lose, MD  pantoprazole (PROTONIX) 40 MG tablet Take 1 tablet (40 mg total) by mouth 2 (two) times daily. 06/26/18  Yes Thurnell Lose, MD    polyethylene glycol (MIRALAX / GLYCOLAX) packet Take 17 g by mouth daily as needed for mild constipation. 06/26/18  Yes Thurnell Lose, MD  spironolactone (ALDACTONE) 25 MG tablet Take 1 tablet (25 mg total) by mouth daily. 06/26/18  Yes Thurnell Lose, MD  tamsulosin (FLOMAX) 0.4 MG CAPS capsule Take 1 capsule (0.4 mg total) by mouth daily. 06/26/18  Yes Thurnell Lose, MD  vitamin B-12 100 MCG tablet Take 1 tablet (100 mcg total) by mouth daily. 06/26/18  Yes Thurnell Lose, MD    Physical Exam:  Physical Exam  Constitutional: She is oriented to person, place, and time. She appears well-developed and well-nourished. No distress.  HENT:  Mouth/Throat: Oropharynx is clear and moist.  Eyes: Pupils are equal, round, and reactive to light. Conjunctivae and EOM are normal.  Neck: Normal range of motion.  Cardiovascular: Normal rate. An irregular rhythm present.  Murmur heard.  Systolic murmur is present with a grade of 1/6. Pulmonary/Chest: Effort normal and breath sounds normal. No respiratory distress. She has no wheezes. She has no rales.  Abdominal: Soft. Bowel sounds are normal. She exhibits no distension. There is tenderness in the right upper quadrant. There is no rebound and no guarding.  Musculoskeletal: Normal range of motion. She exhibits no edema or tenderness.  Lymphadenopathy:    She has no cervical adenopathy.  Neurological: She is alert and oriented to person, place, and time.  Skin: Skin is warm and dry. She is not diaphoretic.  Psychiatric: She has a normal mood and affect.  Vitals reviewed.    Labs on Admission: I have personally reviewed following labs and imaging studies  CBC: Recent Labs  Lab 07/03/18 07/08/18 1152  WBC 8.7 12.0*  HGB 8.8* 10.6*  HCT 26* 35.3*  MCV  --  94.9  PLT 353 441*    Basic Metabolic Panel: Recent Labs  Lab 07/03/18 07/08/18 1152  NA 143 142  K 3.4 3.2*  CL  --  96*  CO2  --  31  GLUCOSE  --  152*  BUN 52* 70*   CREATININE 2.4* 2.69*  CALCIUM  --  9.1    GFR: Estimated Creatinine Clearance: 18.8 mL/min (A) (by C-G formula based on SCr of 2.69 mg/dL (H)).  Liver Function Tests: Recent Labs  Lab 07/08/18 1152  AST 22  ALT 9  ALKPHOS 71  BILITOT 1.3*  PROT 6.7  ALBUMIN 3.2*   Recent Labs  Lab 07/08/18 1152  LIPASE 305*   No results for input(s): AMMONIA in the last 168 hours.  Coagulation Profile: No results for input(s): INR, PROTIME in the last 168 hours.  Cardiac Enzymes: Recent Labs  Lab 07/08/18 1152  TROPONINI 0.09*    BNP (last 3 results) No results for input(s): PROBNP in the last 8760 hours.  HbA1C: No results for input(s): HGBA1C in the last 72 hours.  CBG: No results for input(s): GLUCAP in the last 168 hours.  Lipid Profile: No results for input(s): CHOL, HDL, LDLCALC, TRIG, CHOLHDL, LDLDIRECT in the last 72 hours.  Thyroid Function Tests: No results for input(s): TSH, T4TOTAL, FREET4, T3FREE, THYROIDAB in the last 72 hours.  Anemia Panel: No results for input(s): VITAMINB12, FOLATE, FERRITIN, TIBC, IRON, RETICCTPCT in the last 72 hours.  Urine analysis:    Component Value Date/Time   COLORURINE YELLOW 07/08/2018 1335   APPEARANCEUR HAZY (A) 07/08/2018 1335   LABSPEC 1.011 07/08/2018 1335   PHURINE 6.0 07/08/2018 1335   GLUCOSEU NEGATIVE 07/08/2018 1335   HGBUR NEGATIVE 07/08/2018 1335   BILIRUBINUR NEGATIVE 07/08/2018 1335   KETONESUR NEGATIVE 07/08/2018 1335   PROTEINUR NEGATIVE 07/08/2018 1335   UROBILINOGEN 0.2 03/04/2011 2107   NITRITE POSITIVE (A) 07/08/2018 1335   LEUKOCYTESUR LARGE (A) 07/08/2018 1335     Radiological Exams on Admission: Dg Abdomen Acute W/chest  Result Date: 07/08/2018 CLINICAL DATA:  Right lower quadrant pain. EXAM: DG ABDOMEN ACUTE W/ 1V CHEST COMPARISON:  Chest x-ray dated June 17, 2018. Abdominal x-ray dated June 15, 2018. FINDINGS: Stable cardiomegaly status post CABG, aortic valve replacement, and left  atrial appendage clipping. Worsened pulmonary vascular congestion with new mild interstitial edema. No focal consolidation, pleural effusion, or pneumothorax. There is no evidence of dilated bowel loops or free intraperitoneal air. No radiopaque calculi or other significant radiographic abnormality is seen. Unchanged aorto bi-iliac stent graft and left renal artery stent. No acute osseous abnormality. IMPRESSION: 1. Worsening pulmonary vascular congestion with new mild interstitial pulmonary edema. 2. No acute abdominal finding. Electronically Signed   By: Titus Dubin M.D.   On: 07/08/2018 12:41    EKG: Independently reviewed. Ventricular bigeminy  Assessment/Plan Active Problems:   RUQ pain   RUQ pain Patient with a recent cholecystectomy in addition to recent gallstone pancreatitis with pseudocyst.  Per patient, pain is similar to previous presentation.  Patient with elevated lipase of 305.  No imaging available. Unsure if this is pancreatitis versus complication of recent cholecystectomy -N.p.o. except for sips of meds -Right upper quadrant ultrasound and if negative will get a CT abdomen pelvis without contrast -Depending on imaging results will likely call [facility]  Acute kidney injury on CKD stage IV In setting of fluid overload. Diuresis below -Recheck BMP in AM  Abdominal aortic aneurysm Recent repair. Also  with a common iliac artery stent placed. Currently on Plavix -Continue Plavix  Chronic diastolic heart failure Hypervolemic on exam -Lasix IV x1, then resume home lasix with potassium supplementation -Daily weights/strict in and outs  COPD Stable. -albuterol prn and Dulera  Chronic respite failure with hypoxia -Continue oxygen therapy at 2 L via nasal cannula  CAD Patient is status post CABG  Essential hypertension -Continue amlodipine, carvedilol, isosorbide mononitrate -Holding spironolactone secondary to renal function  Atrial fibrillation Not on  anticoagulation secondary to recent GI bleeding -Continue carvedilol  Peripheral artery disease Patient is status post iliac stent as mentioned above. On plavix.  Protein calorie malnutrition -Holding nutrition supplement for now  Anxiety -Continue Xanax as needed -Cymbalta held secondary to renal function  Recent GI bleed In setting of esophageal ulcer likely from NG tube. Hemoglobin stable  Ventricular bigeminy New. Troponin elevated but trending down from recent admission. Patient has not had her betablocker today -Telemetry -Repeat EKG in AM; if still an issue, would consult cardiology   DVT prophylaxis: Heparin Code Status: Full code Family Communication: None at bedside Disposition Plan: Telemetry Consults called: None Admission status: Inpatient   Cordelia Poche, MD [facility] 07/08/2018, 4:17 PM  If 7PM-7AM, please contact night-coverage [email or web address] Password [email or web address]

## 2018-07-08 NOTE — ED Notes (Signed)
Report given to RN Nira Conn [phone], for [address], Room 1438.

## 2018-07-08 NOTE — ED Provider Notes (Signed)
[facility] DEPT Provider Note   CSN: [id number] Arrival date & time: 07/08/18  1124     History   Chief Complaint Chief Complaint  Patient presents with  . Abdominal Pain    HPI [name] is a 67 y.o. female.  HPI Patient presents feeling bad all over nausea vomiting with abdominal pain.Recent admission to the hospital with pancreatitis and GI bleed.  States she feels the same it is worsening pain.  EMS reportedly had temperature 100 degrees orally.  No blood in the stool although has had previous GI bleeds.  Is on Vicodin at home.  Was hypoxic upon arrival.  Pain is on the left abdomen. Past Medical History:  Diagnosis Date  . AAA (abdominal aortic aneurysm) ([place]) 5/09   3.7x3.3 by u/s 2009  . Abnormal EKG    deep TW inversions chronic  . Anemia   . CAD (coronary artery disease)    a. CABG 03/2011 LIMA to LAD, SVG to OM, SVG to PDA. b. cath 06/25/2015 DES to SVG to rPDA, patent LIMA to LAD, patent SVG to OM  . carotid stenosis 5/09   S/p L CEA ;  60-79% bilat ICA by preCABG dopplers 7/12  . CHF (congestive heart failure) ([place])   . Chronic back pain    "all over" (06/24/2015)  . Chronic bronchitis ([place])    "get it pretty much q yr" (06/24/2015)  . CKD (chronic kidney disease)   . Complication of anesthesia 1966   "problem w/ether"  . COPD (chronic obstructive pulmonary disease) ([place])   . Depression   . GERD (gastroesophageal reflux disease)    With hiatal hernia  . GIB (gastrointestinal bleeding) 9/11   S/p EGD with cautery at [facility]  . Heart murmur   . History of blood transfusion "a few times"   "all related to anemia" (06/24/2015)  . History of hiatal hernia   . Hyperlipidemia   . Hypertension   . Mitral regurgitation    3+ MR by intraoperative TEE;  s/p MV repair with Dr. Roxy Manns 7/12  . Myocardial infarction [facility]) 2012 "several"  . On home oxygen therapy    "2 liters at night; negative for sleep apnea"  . PAD (peripheral artery  disease) (HCC)    Severe; s/p bilateral renal artery stents, moderate in-stent restenosis  . Pancreatitis 05/2018  . Paroxysmal atrial fibrillation (HCC)    coumadin;  echo 9/07: EF 60%, mild LVH;  s/p Cox Maze 7/12 with LAA clipping  . Subclavian artery stenosis, left (HCC)    stented by Dr. Trula Slade on 10/18 to help flow of her LIMA to LAD  . Type II diabetes mellitus [facility])     Patient Active Problem List   Diagnosis Date Noted  . Pancreatic pseudocyst 07/04/2018  . 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 Urinary retention 07/04/2018  . S/P insertion of iliac artery stent 07/04/2018  . Endoleak post (EVAR) endovascular aneurysm repair ([place]) 07/04/2018  . Physical deconditioning   . Esophageal ulcer with bleeding   . Pressure injury of skin 06/16/2018  . Hematemesis   . AKI (acute kidney injury) ([place]) 06/09/2018  . Palliative care patient   . Palliative care by specialist   . Hyperkalemia 06/08/2018  . Anasarca 06/07/2018  . Demand ischemia ([place]) 06/07/2018  . Protein-calorie malnutrition, severe 05/29/2018  . Type II diabetes mellitus with renal manifestations ([place]) 05/28/2018  . Pancreatitis, recurrent 05/28/2018  . SIRS (systemic inflammatory response syndrome) ([place]) 05/28/2018  . Pancreatitis 05/28/2018  .  Volume overload 04/17/2018  . Anemia due to GI blood loss 04/14/2018  . Acute gallstone pancreatitis 04/14/2018  . Acute cholecystitis 04/14/2018  . Gastrointestinal hemorrhage   . Fatigue associated with anemia 04/09/2018  . Hypoglycemia 01/19/2018  . Type II diabetes mellitus ([place]) 01/19/2018  . Nausea with vomiting 01/19/2018  . Uncontrolled hypertension 01/19/2018  . Hyperlipidemia 01/19/2018  . CAD (coronary artery disease) 01/19/2018  . COPD with hypoxia ([place]) 01/19/2018  . Chronic kidney disease (CKD), stage IV (severe) ([place]) 01/19/2018  . Retroperitoneal hematoma 01/19/2018  . Acute blood loss anemia 01/19/2018  . Chronic diastolic congestive heart failure  ([place]) 01/19/2018  . AAA (abdominal aortic aneurysm) ([place]) 01/16/2018  . Elevated brain natriuretic peptide (BNP) level 10/07/2015  . Diabetes mellitus ([place]) 10/07/2015  . Pain in the chest   . PAD (peripheral artery disease) ([place]) 07/29/2015  . Chest pain 07/04/2015  . Acute on chronic kidney failure ([place]) 07/04/2015  . GERD (gastroesophageal reflux disease) 07/04/2015  . Anemia 06/26/2015  . Coronary artery disease involving autologous vein coronary bypass graft with unstable angina pectoris ([place]) 06/24/2015  . Angina at rest [facility])   . Subclavian artery stenosis, left ([place]) 06/23/2015  . AAA (abdominal aortic aneurysm) without rupture ([place]) 12/10/2014  . Subclavian steal syndrome 12/10/2014  . Renal artery stenosis ([place]) 09/04/2013  . Carotid artery stenosis 09/28/2012  . Mitral valve disorder 06/23/2011  . S/P CABG x 3 06/16/2011  . S/P Maze operation for atrial fibrillation 06/16/2011  . S/P MVR (mitral valve repair) 06/16/2011  . Follow-up examination following surgery 03/29/2011  . CKD (chronic kidney disease) 03/03/2011  . COPD (chronic obstructive pulmonary disease) ([place]) 03/03/2011  . Chronic diastolic heart failure ([place]) 02/26/2011  . Hypotension 02/26/2011  . NSTEMI (non-ST elevated myocardial infarction) ([place]) 02/26/2011  . Long term (current) use of anticoagulants 12/17/2010  . OBSTRUCTIVE SLEEP APNEA 09/04/2010  . HYPOXEMIA 09/04/2010  . SNORING 08/05/2010  . Atrial fibrillation ([place]) 12/09/2009  . WEIGHT LOSS 03/28/2009  . PALPITATIONS 03/28/2009  . PVD 11/21/2008  . HYPERLIPIDEMIA-MIXED 11/20/2008  . TOBACCO ABUSE 11/20/2008  . Essential hypertension 11/20/2008  . Coronary artery disease involving native heart with angina pectoris ([place]) 11/20/2008  . ABDOMINAL AORTIC ANEURYSM 11/20/2008    Past Surgical History:  Procedure Laterality Date  . A/V FISTULAGRAM Left 03/21/2018   Procedure: A/V FISTULAGRAM;  Surgeon: Serafina Mitchell, MD;  Location: [facility]  CV LAB;  Service: Cardiovascular;  Laterality: Left;  . ABDOMINAL AORTIC ENDOVASCULAR STENT GRAFT N/A 01/17/2018   Procedure: ABDOMINAL AORTIC ENDOVASCULAR STENT GRAFT WITH CO2;  Surgeon: Serafina Mitchell, MD;  Location: [place];  Service: Vascular;  Laterality: N/A;  . ABDOMINAL HYSTERECTOMY  1990's  . APPENDECTOMY  Aug. 11, 2016   Ruptured  . AV FISTULA PLACEMENT Left 11/03/2017   Procedure: ARTERIOVENOUS (AV) FISTULA CREATION LEFT ARM;  Surgeon: Serafina Mitchell, MD;  Location: [facility];  Service: Vascular;  Laterality: Left;  . BOWEL RESECTION  2013  . CARDIAC CATHETERIZATION N/A 06/24/2015   Procedure: Left Heart Cath and Cors/Grafts Angiography;  Surgeon: Leonie Man, MD;  Location: [place] CV LAB;  Service: Cardiovascular;  Laterality: N/A;  . CARDIAC CATHETERIZATION N/A 06/25/2015   Procedure: Coronary Stent Intervention;  Surgeon: Sherren Mocha, MD;  Location: [place] CV LAB;  Service: Cardiovascular;  Laterality: N/A;  . CAROTID ENDARTERECTOMY Left   . CATARACT EXTRACTION Right 03/27/2018  . CHOLECYSTECTOMY N/A 04/19/2018   Procedure: LAPAROSCOPIC CHOLECYSTECTOMY WITH INTRAOPERATIVE CHOLANGIOGRAM;  Surgeon:  Rolm Bookbinder, MD;  Location: [facility];  Service: General;  Laterality: N/A;  . CORONARY ANGIOPLASTY    . CORONARY ARTERY BYPASS GRAFT  03/05/2011   CABG X3 (LIMA to LAD, SVG to OM, SVG to PDA, EVH via left thigh  . CORONARY STENT PLACEMENT    . ESOPHAGOGASTRODUODENOSCOPY N/A 04/12/2018   Procedure: ESOPHAGOGASTRODUODENOSCOPY (EGD);  Surgeon: [name], MD;  Location[facility] ENDOSCOPY;  Service: Endoscopy;  Laterality: N/A;  . ESOPHAGOGASTRODUODENOSCOPY N/A 06/16/2018   Procedure: ESOPHAGOGASTRODUODENOSCOPY (EGD);  Surgeon: Thornton Park, MD;  Location: [place];  Service: Gastroenterology;  Laterality: N/A;  . LACERATION REPAIR Right 1990's   WRIST  . MAZE  03/05/2011   complete biatrial lesion set with clipping of LA appendage  . MITRAL VALVE REPAIR   03/05/2011   72mm Memo 3D ring annuloplasty for ischemic MR  . PERIPHERAL VASCULAR BALLOON ANGIOPLASTY Left 03/21/2018   Procedure: PERIPHERAL VASCULAR BALLOON ANGIOPLASTY;  Surgeon: Serafina Mitchell, MD;  Location: [facility] [facility] CV LAB;  Service: Cardiovascular;  Laterality: Left;  . PERIPHERAL VASCULAR CATHETERIZATION N/A 06/24/2015   Procedure: Aortic Arch Angiography;  Surgeon: Serafina Mitchell, MD;  Location: [place] CV LAB;  Service: Cardiovascular;  Laterality: N/A;  . PERIPHERAL VASCULAR CATHETERIZATION  06/24/2015   Procedure: Peripheral Vascular Intervention;  Surgeon: Serafina Mitchell, MD;  Location: [place] CV LAB;  Service: Cardiovascular;;  . PERIPHERAL VASCULAR CATHETERIZATION N/A 06/24/2015   Procedure: Abdominal Aortogram;  Surgeon: Serafina Mitchell, MD;  Location: [place] CV LAB;  Service: Cardiovascular;  Laterality: N/A;  . RENAL ANGIOGRAM Bilateral 09/11/2013   Procedure: RENAL ANGIOGRAM;  Surgeon: Serafina Mitchell, MD;  Location: [facility] CATH LAB;  Service: Cardiovascular;  Laterality: Bilateral;  . RIGHT FEMORAL-POPLITEAL BYPASS       OB History   None      Home Medications    Prior to Admission medications   Medication Sig Start Date End Date Taking? Authorizing Provider  acetaminophen (TYLENOL) 325 MG tablet Take 325 mg by mouth every 6 (six) hours as needed (for pain).    [provider]  albuterol (PROVENTIL HFA;VENTOLIN HFA) 108 (90 Base) MCG/ACT inhaler Inhale 2 puffs into the lungs every 4 (four) hours as needed for wheezing or shortness of breath. ProAir Patient taking differently: Inhale 2 puffs into the lungs every 4 (four) hours as needed for wheezing or shortness of breath.  10/08/15   Delfina Redwood, MD  allopurinol (ZYLOPRIM) 100 MG tablet Take 100 mg by mouth daily. 02/02/17   [provider]  Amino Acids-Protein Hydrolys (FEEDING SUPPLEMENT, PRO-STAT SUGAR FREE 64,) LIQD Take 30 mLs by mouth 3 (three) times daily with meals.  06/26/18   Thurnell Lose, MD  amLODipine (NORVASC) 10 MG tablet Take 10 mg by mouth daily.      [provider]  atorvastatin (LIPITOR) 40 MG tablet Take 40 mg by mouth every evening.  12/07/17   [provider]  budesonide-formoterol (SYMBICORT) 160-4.5 MCG/ACT inhaler Inhale 2 puffs into the lungs 2 (two) times daily as needed (for flares).    [provider]  carvedilol (COREG) 12.5 MG tablet Take 1 tablet (12.5 mg total) by mouth 2 (two) times daily with a meal. 06/26/18   Thurnell Lose, MD  Cholecalciferol (VITAMIN D3) 5000 units TABS Take 5,000 Units by mouth daily.    [provider]  clopidogrel (PLAVIX) 75 MG tablet Take 1 tablet (75 mg total) by mouth daily with breakfast. 06/26/15   Eulas Post,  Isaac Laud, PA  docusate sodium (COLACE) 100 MG capsule Take 200 mg by mouth daily as needed (when taking pain medications).     [provider]  DULoxetine (CYMBALTA) 60 MG capsule Take 60 mg by mouth daily.  10/26/17   [provider]  famotidine (PEPCID) 20 MG tablet TAKE 1 TABLET (20 MG TOTAL) BY MOUTH DAILY BEFORE SUPPER. Patient taking differently: Take 20 mg by mouth daily before supper.  01/27/16   Sherren Mocha, MD  fenofibrate 160 MG tablet Take 1 tablet (160 mg total) by mouth daily. Patient taking differently: Take 160 mg by mouth at bedtime.  10/08/15   Delfina Redwood, MD  ferrous sulfate 325 (65 FE) MG tablet Take 325 mg by mouth 2 (two) times daily with a meal.    [provider]  furosemide (LASIX) 40 MG tablet Take 1 tablet (40 mg total) by mouth 2 (two) times daily. 06/26/18   Thurnell Lose, MD  gabapentin (NEURONTIN) 300 MG capsule TAKE 1 CAPSULE(300 MG) BY MOUTH TWICE DAILY Patient taking differently: Take 300 mg by mouth 2 (two) times daily.  05/05/18   Serafina Mitchell, MD  hydrALAZINE (APRESOLINE) 25 MG tablet Take 1 tablet (25 mg total) by mouth every 8 (eight) hours. 06/26/18   Thurnell Lose, MD    HYDROcodone-acetaminophen (NORCO/VICODIN) 5-325 MG tablet Take 1 tablet by mouth every 6 (six) hours as needed for moderate pain. 06/26/18   Thurnell Lose, MD  isosorbide mononitrate (IMDUR) 30 MG 24 hr tablet Take 0.5 tablets (15 mg total) by mouth daily. 12/02/15   Sherren Mocha, MD  mirtazapine (REMERON) 7.5 MG tablet Take 7.5 mg by mouth at bedtime.  02/15/18 08/14/18  [provider]  nicotine (NICODERM CQ - DOSED IN MG/24 HOURS) 14 mg/24hr patch Place 1 patch (14 mg total) onto the skin daily. 06/26/18   Thurnell Lose, MD  pantoprazole (PROTONIX) 40 MG tablet Take 1 tablet (40 mg total) by mouth 2 (two) times daily. 06/26/18   Thurnell Lose, MD  polyethylene glycol (MIRALAX / GLYCOLAX) packet Take 17 g by mouth daily as needed for mild constipation. 06/26/18   Thurnell Lose, MD  spironolactone (ALDACTONE) 25 MG tablet Take 1 tablet (25 mg total) by mouth daily. 06/26/18   Thurnell Lose, MD  tamsulosin (FLOMAX) 0.4 MG CAPS capsule Take 1 capsule (0.4 mg total) by mouth daily. 06/26/18   Thurnell Lose, MD  vitamin B-12 100 MCG tablet Take 1 tablet (100 mcg total) by mouth daily. 06/26/18   Thurnell Lose, MD    Family History Family History  Problem Relation Age of Onset  . Emphysema Mother   . Heart disease Mother        before age 8  . Hypertension Mother   . Hyperlipidemia Mother   . Heart attack Mother   . AAA (abdominal aortic aneurysm) Mother        rupture  . Heart attack Father 64  . Emphysema Father   . Heart disease Father        before age 56  . Hyperlipidemia Father   . Hypertension Father   . Peripheral vascular disease Father   . Diabetes Brother   . Heart disease Brother        before age 82  . Hyperlipidemia Brother   . Hypertension Brother   . Heart attack Brother        CABG  . Heart disease Other  Vascular disease in grandparents, uncles and dad  . Colon cancer Neg Hx     Social History Social History    Tobacco Use  . Smoking status: Current Every Day Smoker    Years: 50.00    Types: Cigarettes  . Smokeless tobacco: Never Used  . Tobacco comment: 3/4 pk per day  Substance Use Topics  . Alcohol use: Yes    Alcohol/week: 0.0 standard drinks    Comment: occasionally  . Drug use: Yes    Types: Marijuana     Allergies   Isosorbide; Isosorbide nitrate; Oxycodone-acetaminophen; Codeine; Contrast media [iodinated diagnostic agents]; Ioxaglate; Metrizamide; and Percocet [oxycodone-acetaminophen]   Review of Systems Review of Systems  Constitutional: Negative for appetite change.  HENT: Negative for congestion.   Respiratory: Positive for shortness of breath.   Gastrointestinal: Positive for abdominal pain, nausea and vomiting.  Genitourinary: Negative for flank pain.  Musculoskeletal: Negative for back pain.  Skin: Negative for rash.  Neurological: Negative for weakness.  Psychiatric/Behavioral: Negative for confusion.     Physical Exam Updated Vital Signs BP (!) 174/46   Pulse (!) 36   Temp 99.5 F (37.5 C) (Rectal)   Resp 13   Ht 5\' 5"  (1.651 m)   Wt 61.2 kg   SpO2 98%   BMI 22.45 kg/m   Physical Exam  Constitutional: She appears well-developed.  HENT:  Head: Normocephalic.  Eyes: EOM are normal.  Cardiovascular: Regular rhythm.  Pulmonary/Chest:  Rales at bases  Abdominal:  Left-sided abdominal tenderness.    Neurological: She is alert.  Skin: Skin is warm. Capillary refill takes less than 2 seconds.  Bilateral lower extremity edema.   ED Treatments / Results  Labs (all labs ordered are listed, but only abnormal results are displayed) Labs Reviewed  LIPASE, BLOOD - Abnormal; Notable for the following components:      Result Value   Lipase 305 (*)    All other components within normal limits  COMPREHENSIVE METABOLIC PANEL - Abnormal; Notable for the following components:   Potassium 3.2 (*)    Chloride 96 (*)    Glucose, Bld 152 (*)    BUN 70 (*)     Creatinine, Ser 2.69 (*)    Albumin 3.2 (*)    Total Bilirubin 1.3 (*)    GFR calc non Af Amer 17 (*)    GFR calc Af Amer 20 (*)    All other components within normal limits  CBC - Abnormal; Notable for the following components:   WBC 12.0 (*)    RBC 3.72 (*)    Hemoglobin 10.6 (*)    HCT 35.3 (*)    RDW 17.8 (*)    Platelets 441 (*)    All other components within normal limits  URINALYSIS, ROUTINE W REFLEX MICROSCOPIC  TROPONIN I  I-STAT CG4 LACTIC ACID, ED  I-STAT CG4 LACTIC ACID, ED    EKG EKG Interpretation  Date/Time:  Saturday July 08 2018 11:44:26 EDT Ventricular Rate:  97 PR Interval:    QRS Duration: 124 QT Interval:  426 QTC Calculation: 483 R Axis:   0 Text Interpretation:  Sinus rhythm Ventricular bigeminy Right bundle branch block Confirmed by Davonna Belling [phone]) on 07/08/2018 1:19:55 PM   Radiology Dg Abdomen Acute W/chest  Result Date: 07/08/2018 CLINICAL DATA:  Right lower quadrant pain. EXAM: DG ABDOMEN ACUTE W/ 1V CHEST COMPARISON:  Chest x-ray dated June 17, 2018. Abdominal x-ray dated June 15, 2018. FINDINGS: Stable cardiomegaly status post CABG, aortic valve  replacement, and left atrial appendage clipping. Worsened pulmonary vascular congestion with new mild interstitial edema. No focal consolidation, pleural effusion, or pneumothorax. There is no evidence of dilated bowel loops or free intraperitoneal air. No radiopaque calculi or other significant radiographic abnormality is seen. Unchanged aorto bi-iliac stent graft and left renal artery stent. No acute osseous abnormality. IMPRESSION: 1. Worsening pulmonary vascular congestion with new mild interstitial pulmonary edema. 2. No acute abdominal finding. Electronically Signed   By: Titus Dubin M.D.   On: 07/08/2018 12:41    Procedures Procedures (including critical care time)  Medications Ordered in ED Medications  HYDROmorphone (DILAUDID) injection 0.5 mg (has no administration  in time range)  sodium chloride 0.9 % bolus 500 mL (has no administration in time range)     Initial Impression / Assessment and Plan / ED Course  I have reviewed the triage vital signs and the nursing notes.  Pertinent labs & imaging results that were available during my care of the patient were reviewed by me and considered in my medical decision making (see chart for details).     Patient presents with abdominal pain.  Appears that her pancreatitis is back.  Appears volume overloaded on x-ray.  Kidney function is worsened however with pancreatitis likely needs more fluid.  Patient lipase is increasing.  Will admit back to hospitalist.  Final Clinical Impressions(s) / ED Diagnoses   Final diagnoses:  Acute pancreatitis, unspecified complication status, unspecified pancreatitis type  Acute kidney injury ([place])  Congestive heart failure, unspecified HF chronicity, unspecified heart failure type [facility])    ED Discharge Orders    None       Davonna Belling, MD 07/08/18 1351

## 2018-07-08 NOTE — ED Notes (Signed)
Ultrasound is at bedside

## 2018-07-09 DIAGNOSIS — T888XXS Other specified complications of surgical and medical care, not elsewhere classified, sequela: Secondary | ICD-10-CM

## 2018-07-09 LAB — COMPREHENSIVE METABOLIC PANEL
ALK PHOS: 62 U/L (ref 38–126)
ALT: 8 U/L (ref 0–44)
AST: 23 U/L (ref 15–41)
Albumin: 2.7 g/dL — ABNORMAL LOW (ref 3.5–5.0)
Anion gap: 11 (ref 5–15)
BUN: 66 mg/dL — AB (ref 8–23)
CALCIUM: 8.9 mg/dL (ref 8.9–10.3)
CO2: 32 mmol/L (ref 22–32)
CREATININE: 2.59 mg/dL — AB (ref 0.44–1.00)
Chloride: 101 mmol/L (ref 98–111)
GFR, EST AFRICAN AMERICAN: 21 mL/min — AB (ref >60)
GFR, EST NON AFRICAN AMERICAN: 18 mL/min — AB (ref >60)
Glucose, Bld: 132 mg/dL — ABNORMAL HIGH (ref 70–99)
Potassium: 3.8 mmol/L (ref 3.5–5.1)
Sodium: 144 mmol/L (ref 135–145)
Total Bilirubin: 0.8 mg/dL (ref 0.3–1.2)
Total Protein: 5.6 g/dL — ABNORMAL LOW (ref 6.5–8.1)

## 2018-07-09 LAB — CBC
HEMATOCRIT: 30.3 % — AB (ref 36.0–46.0)
Hemoglobin: 8.9 g/dL — ABNORMAL LOW (ref 12.0–15.0)
MCH: 28.7 pg (ref 26.0–34.0)
MCHC: 29.4 g/dL — AB (ref 30.0–36.0)
MCV: 97.7 fL (ref 80.0–100.0)
NRBC: 0 % (ref 0.0–0.2)
PLATELETS: 377 10*3/uL (ref 150–400)
RBC: 3.1 MIL/uL — ABNORMAL LOW (ref 3.87–5.11)
RDW: 17.7 % — AB (ref 11.5–15.5)
WBC: 8.9 10*3/uL (ref 4.0–10.5)

## 2018-07-09 MED ORDER — ACETAMINOPHEN 325 MG PO TABS
650.0000 mg | ORAL_TABLET | Freq: Four times a day (QID) | ORAL | Status: DC | PRN
  Administered 2018-07-09: 650 mg via ORAL
  Filled 2018-07-09: qty 2

## 2018-07-09 NOTE — Progress Notes (Signed)
Patient ID: [name], female   DOB: [birth year], 67 y.o.   MRN: [id number]  Discussed with Dr. Darrick Meigs from the [facility].  S/p laparoscopic cholecystectomy on 04/19/18.  Now with recurrent RUQ pain, elevated lipase, transiently elevated T. Bili.    Would obtain MRCP to rule out retained CBD stone.  Would not drain hematoma in GB fossa at this time.  The surgery team tomorrow will follow-up on MRCP results and give additional recommendations.  [name]. Georgette Dover, MD, [facility] [phone]  07/09/2018 12:02 PM

## 2018-07-09 NOTE — Plan of Care (Signed)
Patient very drowsy for majority of shift, medicated for pain x 2 with improvement.  Daughters in to visit, concerned about what hospitalization means for rehab bed, social work consult made.  Patient up to [facility] x 1 with standby assist.

## 2018-07-09 NOTE — Progress Notes (Signed)
[facility]  PROGRESS NOTE  [name] BHA:[id number] DOB: August 14, 1951 DOA: 07/08/2018 PCP: Bernerd Limbo, MD   Brief HPI:   67 year old female with a history of AAA, acute cholecystitis status post cholecystectomy, gallstone pancreatitis, CKD stage IV, COPD, CAD, diabetes mellitus type 2, hypertension, pancreatitis with pseudocyst came to hospital with right upper quadrant abdominal pain. In the ED she was found to have elevated lipase, abdominal ultrasound showed fluid collection in the gallbladder fossa.  IR consulted for drainage of the fluid collection.    Subjective   Patient seen and examined, complains of abdominal pain.   Assessment/Plan:     1. Right upper quadrant fluid collection-seen on the abdominal ultrasound, initial plan for  IR guided drainage of the fluid collection.  General surgery was consulted, did recommend getting MRCP.  Will hold IR drainage of the fluid collection.  Will obtain MRCP without contrast to rule out retained stone.  2. Acute kidney injury on CKD stage IV-patient given 1 dose of Lasix yesterday.  Creatinine has improved, likely cardiorenal syndrome from underlying diastolic CHF.  Follow BMP in am.  3. Abdominal aortic aneurysm-patient had recent repair, also has common iliac artery stent placed.  Continue Plavix.  4. Chronic diastolic CHF-patient was given IV Lasix x1 yesterday.  Currently on Lasix 40 mg p.o. twice daily.        CBC: Recent Labs  Lab 07/03/18 07/08/18 1152 07/09/18 0510  WBC 8.7 12.0* 8.9  HGB 8.8* 10.6* 8.9*  HCT 26* 35.3* 30.3*  MCV  --  94.9 97.7  PLT 353 441* 973    Basic Metabolic Panel: Recent Labs  Lab 07/03/18 07/08/18 1152 07/09/18 0510  NA 143 142 144  K 3.4 3.2* 3.8  CL  --  96* 101  CO2  --  31 32  GLUCOSE  --  152* 132*  BUN 52* 70* 66*  CREATININE 2.4* 2.69* 2.59*  CALCIUM  --  9.1 8.9     DVT prophylaxis: Heparin  Code Status: Full code  Family Communication: No family at  bedside  Disposition Plan: likely home when medically ready for discharge   Consultants:    Procedures:     Antibiotics:   Anti-infectives (From admission, onward)   None       Objective   Vitals:   07/08/18 1703 07/08/18 2109 07/09/18 0629 07/09/18 0822  BP: (!) 175/68 (!) 150/57 (!) 137/56   Pulse: 96 74 87   Resp: 18 18 16    Temp: 99.2 F (37.3 C) 99.6 F (37.6 C) 98.9 F (37.2 C) 98.3 F (36.8 C)  TempSrc: Oral Oral Oral Oral  SpO2: 95% 97% 90%   Weight:      Height:        Intake/Output Summary (Last 24 hours) at 07/09/2018 1202 Last data filed at 07/09/2018 1148 Gross per 24 hour  Intake 409.83 ml  Output 3250 ml  Net -2840.17 ml   Filed Weights   07/08/18 1134  Weight: 61.2 kg     Physical Examination:    General: Appears in no acute distress  Cardiovascular: S1-S2, regular  Respiratory: Clear to auscultation bilaterally  Abdomen: Right upper quadrant tenderness to palpation  Extremities: No edema in the lower extremities  Neurologic: Alert oriented x3, no focal deficit noted     Data Reviewed: I have personally reviewed following labs and imaging studies   Recent Results (from the past 240 hour(s))  MRSA PCR Screening     Status: None  Collection Time: 07/08/18  6:36 PM  Result Value Ref Range Status   MRSA by PCR NEGATIVE NEGATIVE Final    Comment:        The GeneXpert MRSA Assay (FDA approved for NASAL specimens only), is one component of a comprehensive MRSA colonization surveillance program. It is not intended to diagnose MRSA infection nor to guide or monitor treatment for MRSA infections. Performed at [facility], [place] [address] [place], [place] [address]      Liver Function Tests: Recent Labs  Lab 07/08/18 1152 07/09/18 0510  AST 22 23  ALT 9 8  ALKPHOS 71 62  BILITOT 1.3* 0.8  PROT 6.7 5.6*  ALBUMIN 3.2* 2.7*   Recent Labs  Lab 07/08/18 1152  LIPASE 305*   No results for  input(s): AMMONIA in the last 168 hours.  Cardiac Enzymes: Recent Labs  Lab 07/08/18 1152  TROPONINI 0.09*   BNP (last 3 results) Recent Labs    04/17/18 0534 04/18/18 0501 05/28/18 2325  BNP 2,002.3* 1,099.7* 2,077.1*    ProBNP (last 3 results) No results for input(s): PROBNP in the last 8760 hours.    Studies: Dg Abdomen Acute W/chest  Result Date: 07/08/2018 CLINICAL DATA:  Right lower quadrant pain. EXAM: DG ABDOMEN ACUTE W/ 1V CHEST COMPARISON:  Chest x-ray dated June 17, 2018. Abdominal x-ray dated June 15, 2018. FINDINGS: Stable cardiomegaly status post CABG, aortic valve replacement, and left atrial appendage clipping. Worsened pulmonary vascular congestion with new mild interstitial edema. No focal consolidation, pleural effusion, or pneumothorax. There is no evidence of dilated bowel loops or free intraperitoneal air. No radiopaque calculi or other significant radiographic abnormality is seen. Unchanged aorto bi-iliac stent graft and left renal artery stent. No acute osseous abnormality. IMPRESSION: 1. Worsening pulmonary vascular congestion with new mild interstitial pulmonary edema. 2. No acute abdominal finding. Electronically Signed   By: Titus Dubin M.D.   On: 07/08/2018 12:41   US Abdomen Limited Ruq  Result Date: 07/08/2018 CLINICAL DATA:  Right upper quadrant pain since last night. EXAM: ULTRASOUND ABDOMEN LIMITED RIGHT UPPER QUADRANT COMPARISON:  CT abdomen 05/28/2018, 04/12/2018 FINDINGS: Gallbladder: Prior cholecystectomy. 7.2 x 3.2 x 3.2 cm thick walled fluid collection with hyperechoic wall and central hypoechogenicity with the hypoechoic area measuring approximately 3.2 x 1.2 x 1.3 cm. Trace amount of fluid outside of the collection. There is no significant surrounding Doppler flow to suggest a phlegmon. Common bile duct: Diameter: 8.7 mm.  Proximal pancreatic duct measures 3.7 mm. Liver: No focal hepatic mass. Intrahepatic biliary ductal dilatation.  Portal vein is patent on color Doppler imaging with normal direction of blood flow towards the liver. No significant perihepatic ascites. IMPRESSION: 1. Prior cholecystectomy. 7.2 x 3.2 x 3.2 cm complex fluid collection in the gallbladder fossa likely reflecting a postoperative hematoma. The liquified portion measures approximately 3.2 x 1.2 x 1.3 cm which is smaller compared with the prior CT dated 05/28/2018. Electronically Signed   By: Kathreen Devoid   On: 07/08/2018 17:21    Scheduled Meds: . ALPRAZolam  0.5 mg Oral QHS  . amLODipine  10 mg Oral Daily  . atorvastatin  40 mg Oral QPM  . carvedilol  12.5 mg Oral BID WC  . clopidogrel  75 mg Oral Q breakfast  . famotidine  20 mg Oral QAC supper  . furosemide  40 mg Oral BID  . heparin  5,000 Units Subcutaneous Q8H  . hydrALAZINE  25 mg Oral Q8H  . isosorbide mononitrate  15 mg Oral Daily  . mouth rinse  15 mL Mouth Rinse BID  . mirtazapine  7.5 mg Oral QHS  . mometasone-formoterol  2 puff Inhalation BID  . nicotine  14 mg Transdermal Daily  . potassium chloride  20 mEq Oral BID    Admission status: Inpatient: Based on patients clinical presentation and evaluation of above clinical data, I have made determination that patient meets Inpatient criteria at this time.  Patient has right upper quadrant fluid collection which will need IR drainage and also surgical evaluation.  Time spent: 25 min  [facility] Pager [phone]. If 7PM-7AM, please contact night-coverage at [email or web address], Office  [phone]  password [email or web address]  07/09/2018, 12:02 PM  LOS: 1 day

## 2018-07-10 ENCOUNTER — Inpatient Hospital Stay (HOSPITAL_COMMUNITY): Payer: Medicare Other

## 2018-07-10 ENCOUNTER — Encounter: Payer: Self-pay | Admitting: Internal Medicine

## 2018-07-10 DIAGNOSIS — K831 Obstruction of bile duct: Secondary | ICD-10-CM

## 2018-07-10 LAB — COMPREHENSIVE METABOLIC PANEL
ALT: 9 U/L (ref 0–44)
ANION GAP: 11 (ref 5–15)
AST: 23 U/L (ref 15–41)
Albumin: 2.2 g/dL — ABNORMAL LOW (ref 3.5–5.0)
Alkaline Phosphatase: 43 U/L (ref 38–126)
BUN: 69 mg/dL — ABNORMAL HIGH (ref 8–23)
CALCIUM: 8.2 mg/dL — AB (ref 8.9–10.3)
CO2: 29 mmol/L (ref 22–32)
Chloride: 103 mmol/L (ref 98–111)
Creatinine, Ser: 2.68 mg/dL — ABNORMAL HIGH (ref 0.44–1.00)
GFR calc non Af Amer: 18 mL/min — ABNORMAL LOW (ref >60)
GFR, EST AFRICAN AMERICAN: 20 mL/min — AB (ref >60)
Glucose, Bld: 125 mg/dL — ABNORMAL HIGH (ref 70–99)
POTASSIUM: 4.1 mmol/L (ref 3.5–5.1)
Sodium: 143 mmol/L (ref 135–145)
Total Bilirubin: 1.2 mg/dL (ref 0.3–1.2)
Total Protein: 4.9 g/dL — ABNORMAL LOW (ref 6.5–8.1)

## 2018-07-10 LAB — CBC
HEMATOCRIT: 25.2 % — AB (ref 36.0–46.0)
Hemoglobin: 7.4 g/dL — ABNORMAL LOW (ref 12.0–15.0)
MCH: 28.7 pg (ref 26.0–34.0)
MCHC: 29.4 g/dL — AB (ref 30.0–36.0)
MCV: 97.7 fL (ref 80.0–100.0)
PLATELETS: 258 10*3/uL (ref 150–400)
RBC: 2.58 MIL/uL — ABNORMAL LOW (ref 3.87–5.11)
RDW: 17.5 % — AB (ref 11.5–15.5)
WBC: 16 10*3/uL — AB (ref 4.0–10.5)
nRBC: 0 % (ref 0.0–0.2)

## 2018-07-10 LAB — LIPASE, BLOOD: Lipase: 124 U/L — ABNORMAL HIGH (ref 11–51)

## 2018-07-10 MED ORDER — VANCOMYCIN HCL 10 G IV SOLR
1250.0000 mg | INTRAVENOUS | Status: AC
  Administered 2018-07-10: 1250 mg via INTRAVENOUS
  Filled 2018-07-10: qty 1250

## 2018-07-10 MED ORDER — PIPERACILLIN-TAZOBACTAM IN DEX 2-0.25 GM/50ML IV SOLN
2.2500 g | Freq: Three times a day (TID) | INTRAVENOUS | Status: DC
  Administered 2018-07-10 – 2018-07-11 (×2): 2.25 g via INTRAVENOUS
  Filled 2018-07-10 (×3): qty 50

## 2018-07-10 MED ORDER — VANCOMYCIN HCL IN DEXTROSE 750-5 MG/150ML-% IV SOLN: 750.0000 mg | INTRAVENOUS | Status: DC

## 2018-07-10 MED ORDER — PIPERACILLIN-TAZOBACTAM IN DEX 2-0.25 GM/50ML IV SOLN
2.2500 g | Freq: Once | INTRAVENOUS | Status: AC
  Administered 2018-07-10: 2.25 g via INTRAVENOUS
  Filled 2018-07-10: qty 50

## 2018-07-10 MED ORDER — LOPERAMIDE HCL 2 MG PO CAPS
2.0000 mg | ORAL_CAPSULE | Freq: Every day | ORAL | Status: DC | PRN
  Administered 2018-07-10 – 2018-07-18 (×10): 2 mg via ORAL
  Filled 2018-07-10 (×11): qty 1

## 2018-07-10 MED ORDER — SODIUM CHLORIDE 0.9 % IV SOLN
INTRAVENOUS | Status: DC | PRN
  Administered 2018-07-10: 1000 mL via INTRAVENOUS

## 2018-07-10 MED ORDER — BARIUM SULFATE 2.1 % PO SUSP
450.0000 mL | Freq: Two times a day (BID) | ORAL | Status: AC
  Administered 2018-07-10: 450 mL via ORAL

## 2018-07-10 NOTE — Progress Notes (Addendum)
[facility]  PROGRESS NOTE  [name]:[id number] DOB: 05/07/1951 DOA: 07/08/2018 PCP: Bernerd Limbo, MD   Brief HPI:   67 year old female with a history of AAA, acute cholecystitis status post cholecystectomy, gallstone pancreatitis, CKD stage IV, COPD, CAD, diabetes mellitus type 2, hypertension, pancreatitis with pseudocyst came to hospital with right upper quadrant abdominal pain. In the ED she was found to have elevated lipase, abdominal ultrasound showed fluid collection in the gallbladder fossa.  IR consulted for drainage of the fluid collection.    Subjective   Patient seen and examined, feels better than yesterday.  Still complains of right upper quadrant pain.  MRCP shows stone in the cystic stump and also fluid in the gallbladder fossa consistent with abscess versus bile leak.     Assessment/Plan:     1. Right upper quadrant fluid collection-seen on the abdominal ultrasound, initial plan for  IR guided drainage of the fluid collection.  Lipase is now down to 124.  General surgery was consulted, did recommend getting MRCP.  MRCP showed dilated cystic duct stump and gallbladder fossa containing a 5 mm calculus.  No evidence of bili ductal dilation or choledocholithiasis.  Also a small amount of fluid in the gallbladder fossa likely postop bile leak.  Will start empiric IV Zosyn.  General surgery following.  We have consulted GI and IR.  2. Acute kidney injury on CKD stage IV-creatinine stable at 2.68.  3. Abdominal aortic aneurysm-patient had recent repair, also has common iliac artery stent placed.  Plavix is on hold in anticipation for surgery  4. Chronic diastolic CHF-patient was given IV Lasix x1   Currently on Lasix 40 mg p.o. twice daily.        CBC: Recent Labs  Lab 07/08/18 1152 07/09/18 0510 07/10/18 0452  WBC 12.0* 8.9 16.0*  HGB 10.6* 8.9* 7.4*  HCT 35.3* 30.3* 25.2*  MCV 94.9 97.7 97.7  PLT 441* 377 419    Basic Metabolic Panel: Recent  Labs  Lab 07/08/18 1152 07/09/18 0510 07/10/18 0452  NA 142 144 143  K 3.2* 3.8 4.1  CL 96* 101 103  CO2 31 32 29  GLUCOSE 152* 132* 125*  BUN 70* 66* 69*  CREATININE 2.69* 2.59* 2.68*  CALCIUM 9.1 8.9 8.2*     DVT prophylaxis: Heparin  Code Status: Full code  Family Communication: No family at bedside  Disposition Plan: likely home when medically ready for discharge   Consultants:    Procedures:     Antibiotics:   Anti-infectives (From admission, onward)   Start     Dose/Rate Route Frequency Ordered Stop   07/10/18 2000  piperacillin-tazobactam (ZOSYN) IVPB 2.25 g     2.25 g 100 mL/hr over 30 Minutes Intravenous Every 8 hours 07/10/18 1203     07/10/18 1200  piperacillin-tazobactam (ZOSYN) IVPB 2.25 g     2.25 g 100 mL/hr over 30 Minutes Intravenous  Once 07/10/18 1040 07/10/18 1233       Objective   Vitals:   07/10/18 1015 07/10/18 1022 07/10/18 1256 07/10/18 1419  BP:   (!) 114/46 (!) 125/49  Pulse:   65 60  Resp:   16   Temp:   98.6 F (37 C)   TempSrc:   Oral   SpO2: 98% 96% 94% 98%  Weight:      Height:        Intake/Output Summary (Last 24 hours) at 07/10/2018 1512 Last data filed at 07/10/2018 1258 Gross per 24 hour  Intake 0 ml  Output 900 ml  Net -900 ml   Filed Weights   07/08/18 1134  Weight: 61.2 kg     Physical Examination:   Neck: Supple, no deformities, masses, or tenderness Lungs: Normal respiratory effort, bilateral clear to auscultation, no crackles or wheezes.  Heart: Regular rate and rhythm, S1 and S2 normal, no murmurs, rubs auscultated Abdomen: BS normoactive,soft, positive right upper quadrant tenderness to palpation Extremities: No pretibial edema, no erythema, no cyanosis, no clubbing Neuro : Alert and oriented to time, place and person, No focal deficits  Skin: No rashes seen on exam     Data Reviewed: I have personally reviewed following labs and imaging studies   Recent Results (from the past 240  hour(s))  MRSA PCR Screening     Status: None   Collection Time: 07/08/18  6:36 PM  Result Value Ref Range Status   MRSA by PCR NEGATIVE NEGATIVE Final    Comment:        The GeneXpert MRSA Assay (FDA approved for NASAL specimens only), is one component of a comprehensive MRSA colonization surveillance program. It is not intended to diagnose MRSA infection nor to guide or monitor treatment for MRSA infections. Performed at [facility], [place] [address], [place], [place] [address]      Liver Function Tests: Recent Labs  Lab 07/08/18 1152 07/09/18 0510 07/10/18 0452  AST 22 23 23   ALT 9 8 9   ALKPHOS 71 62 43  BILITOT 1.3* 0.8 1.2  PROT 6.7 5.6* 4.9*  ALBUMIN 3.2* 2.7* 2.2*   Recent Labs  Lab 07/08/18 1152 07/10/18 0452  LIPASE 305* 124*   No results for input(s): AMMONIA in the last 168 hours.  Cardiac Enzymes: Recent Labs  Lab 07/08/18 1152  TROPONINI 0.09*   BNP (last 3 results) Recent Labs    04/17/18 0534 04/18/18 0501 05/28/18 2325  BNP 2,002.3* 1,099.7* 2,077.1*    ProBNP (last 3 results) No results for input(s): PROBNP in the last 8760 hours.    Studies: Mr Abdomen Mrcp Wo Contrast  Result Date: 07/10/2018 CLINICAL DATA:  Right upper quadrant pain. Gallstone pancreatitis. 2 months postop from cholecystectomy. Chronic kidney disease. EXAM: MRI ABDOMEN WITHOUT CONTRAST  (INCLUDING MRCP) TECHNIQUE: Multiplanar multisequence MR imaging of the abdomen was performed. Heavily T2-weighted images of the biliary and pancreatic ducts were obtained, and three-dimensional MRCP images were rendered by post processing. COMPARISON:  CT on 05/28/2018 FINDINGS: Lower chest: No acute findings. Hepatobiliary: No masses visualized on this unenhanced exam. 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 Prior cholecystectomy. Dilated cystic duct stump is seen extending into the gallbladder fossa which contains a residual  small calculus measuring approximately 5 mm. A small amount of fluid is seen in the gallbladder fossa and perihepatic space. Common bile duct measures 9 mm in diameter, which is within normal limits status post cholecystectomy. No evidence of choledocholithiasis. Pancreas: A heterogeneous mass with surrounding inflammatory changes is seen which abuts the pancreatic head and descending duodenum. This measures 4.9 x 3.7 cm on image 60/1100, and contains multiple small internal cystic foci. This has mildly decreased in size since previous study when it measured approximately 5.7 x 4.3 cm. Remainder of the pancreas is normal appearance, and there is no evidence of pancreatic ductal dilatation. Spleen: Within normal limits in size. Small amount of perisplenic fluid. Diffuse T2 hypointensity, consistent with hemosiderosis. Adrenals/Urinary tract: A few small renal cysts are noted bilaterally. No evidence of hydronephrosis.  Stomach/Bowel: Marked decrease in wall thickening involving the hepatic flexure of the colon. No evidence of bowel obstruction. Vascular/Lymphatic: No pathologically enlarged lymph nodes identified. Aortic stent graft again noted. Stable size of native abdominal aortic aneurysm measuring 4.9 cm. Other: Diffuse body wall and mesenteric edema. Mild ascites in perihepatic and perisplenic spaces. Musculoskeletal:  No suspicious bone lesions identified. IMPRESSION: Prior cholecystectomy. Dilated cystic duct stump in gallbladder fossa containing a 5 mm calculus. No evidence of biliary ductal dilatation or choledocholithiasis. Mild decrease in size of heterogeneous "mass" with internal cystic foci and surrounding inflammatory changes between the descending duodenum and pancreatic head. This may represent a biloma, abscess, or evolving pancreatic pseudocyst. Small amount of fluid in gallbladder fossa and upper abdomen; postop bile leak cannot be excluded. Consider nuclear medicine hepatobiliary scan for further  evaluation. Decreased bowel wall thickening involving the hepatic flexure of colon. Aortic stent graft with stable size of 4.9 cm native abdominal aortic aneurysm. Hemosiderosis. Electronically Signed   By: Earle Gell M.D.   On: 07/10/2018 09:23   Mr 3d Recon At Scanner  Result Date: 07/10/2018 CLINICAL DATA:  Right upper quadrant pain. Gallstone pancreatitis. 2 months postop from cholecystectomy. Chronic kidney disease. EXAM: MRI ABDOMEN WITHOUT CONTRAST  (INCLUDING MRCP) TECHNIQUE: Multiplanar multisequence MR imaging of the abdomen was performed. Heavily T2-weighted images of the biliary and pancreatic ducts were obtained, and three-dimensional MRCP images were rendered by post processing. COMPARISON:  CT on 05/28/2018 FINDINGS: Lower chest: No acute findings. Hepatobiliary: No masses visualized on this unenhanced exam. 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 Prior cholecystectomy. Dilated cystic duct stump is seen extending into the gallbladder fossa which contains a residual small calculus measuring approximately 5 mm. A small amount of fluid is seen in the gallbladder fossa and perihepatic space. Common bile duct measures 9 mm in diameter, which is within normal limits status post cholecystectomy. No evidence of choledocholithiasis. Pancreas: A heterogeneous mass with surrounding inflammatory changes is seen which abuts the pancreatic head and descending duodenum. This measures 4.9 x 3.7 cm on image 60/1100, and contains multiple small internal cystic foci. This has mildly decreased in size since previous study when it measured approximately 5.7 x 4.3 cm. Remainder of the pancreas is normal appearance, and there is no evidence of pancreatic ductal dilatation. Spleen: Within normal limits in size. Small amount of perisplenic fluid. Diffuse T2 hypointensity, consistent with hemosiderosis. Adrenals/Urinary tract: A few small renal cysts are noted bilaterally. No  evidence of hydronephrosis. Stomach/Bowel: Marked decrease in wall thickening involving the hepatic flexure of the colon. No evidence of bowel obstruction. Vascular/Lymphatic: No pathologically enlarged lymph nodes identified. Aortic stent graft again noted. Stable size of native abdominal aortic aneurysm measuring 4.9 cm. Other: Diffuse body wall and mesenteric edema. Mild ascites in perihepatic and perisplenic spaces. Musculoskeletal:  No suspicious bone lesions identified. IMPRESSION: Prior cholecystectomy. Dilated cystic duct stump in gallbladder fossa containing a 5 mm calculus. No evidence of biliary ductal dilatation or choledocholithiasis. Mild decrease in size of heterogeneous "mass" with internal cystic foci and surrounding inflammatory changes between the descending duodenum and pancreatic head. This may represent a biloma, abscess, or evolving pancreatic pseudocyst. Small amount of fluid in gallbladder fossa and upper abdomen; postop bile leak cannot be excluded. Consider nuclear medicine hepatobiliary scan for further evaluation. Decreased bowel wall thickening involving the hepatic flexure of colon. Aortic stent graft with stable size of 4.9 cm native abdominal aortic aneurysm. Hemosiderosis. Electronically Signed   By: Jenny Reichmann  Kris Hartmann M.D.   On: 07/10/2018 09:23   US Abdomen Limited Ruq  Result Date: 07/08/2018 CLINICAL DATA:  Right upper quadrant pain since last night. EXAM: ULTRASOUND ABDOMEN LIMITED RIGHT UPPER QUADRANT COMPARISON:  CT abdomen 05/28/2018, 04/12/2018 FINDINGS: Gallbladder: Prior cholecystectomy. 7.2 x 3.2 x 3.2 cm thick walled fluid collection with hyperechoic wall and central hypoechogenicity with the hypoechoic area measuring approximately 3.2 x 1.2 x 1.3 cm. Trace amount of fluid outside of the collection. There is no significant surrounding Doppler flow to suggest a phlegmon. Common bile duct: Diameter: 8.7 mm.  Proximal pancreatic duct measures 3.7 mm. Liver: No focal hepatic  mass. Intrahepatic biliary ductal dilatation. Portal vein is patent on color Doppler imaging with normal direction of blood flow towards the liver. No significant perihepatic ascites. IMPRESSION: 1. Prior cholecystectomy. 7.2 x 3.2 x 3.2 cm complex fluid collection in the gallbladder fossa likely reflecting a postoperative hematoma. The liquified portion measures approximately 3.2 x 1.2 x 1.3 cm which is smaller compared with the prior CT dated 05/28/2018. Electronically Signed   By: Kathreen Devoid   On: 07/08/2018 17:21    Scheduled Meds: . ALPRAZolam  0.5 mg Oral QHS  . amLODipine  10 mg Oral Daily  . atorvastatin  40 mg Oral QPM  . Barium Sulfate  450 mL Oral BID  . carvedilol  12.5 mg Oral BID WC  . clopidogrel  75 mg Oral Q breakfast  . famotidine  20 mg Oral QAC supper  . furosemide  40 mg Oral BID  . heparin  5,000 Units Subcutaneous Q8H  . hydrALAZINE  25 mg Oral Q8H  . isosorbide mononitrate  15 mg Oral Daily  . mouth rinse  15 mL Mouth Rinse BID  . mirtazapine  7.5 mg Oral QHS  . mometasone-formoterol  2 puff Inhalation BID  . nicotine  14 mg Transdermal Daily  . potassium chloride  20 mEq Oral BID    Admission status: Inpatient: Based on patients clinical presentation and evaluation of above clinical data, I have made determination that patient meets Inpatient criteria at this time.  Patient has right upper quadrant fluid collection which will need IR drainage and also surgical evaluation.  Time spent: 25 min  [facility] Pager [phone]. If 7PM-7AM, please contact night-coverage at [email or web address], Office  [phone]  password [email or web address]  07/10/2018, 3:12 PM  LOS: 2 days

## 2018-07-10 NOTE — Progress Notes (Signed)
[facility] Progress Note     Subjective: CC: RUQ pain Patient describes sharp stabbing pain that is constant in the RUQ. Denies nausea, was tolerating a diet at home. Having BMs.   Objective: Vital signs in last 24 hours: Temp:  [98.8 F (37.1 C)-100.6 F (38.1 C)] 99 F (37.2 C) (11/04 0911) Pulse Rate:  [65-90] 68 (11/04 0911) Resp:  [16-18] 16 (11/04 0911) BP: (129-147)/(50-58) 129/58 (11/04 0911) SpO2:  [91 %-99 %] 97 % (11/04 0911) Last BM Date: 07/07/18  Intake/Output from previous day: 11/03 0701 - 11/04 0700 In: 0  Out: 900 [Urine:900] Intake/Output this shift: No intake/output data recorded.  PE: Gen:  Alert, NAD, pleasant Card:  Regular rate and rhythm Pulm:  Normal effort, clear to auscultation bilaterally, on supplemental oxygen Abd: Soft, TTP in RUQ with some milder generalized TTP, non-distended, bowel sounds present, no HSM, incisions well healed Skin: warm and dry, no rashes  Psych: A&Ox3   Lab Results:  Recent Labs    07/09/18 0510 07/10/18 0452  WBC 8.9 16.0*  HGB 8.9* 7.4*  HCT 30.3* 25.2*  PLT 377 258   BMET Recent Labs    07/09/18 0510 07/10/18 0452  NA 144 143  K 3.8 4.1  CL 101 103  CO2 32 29  GLUCOSE 132* 125*  BUN 66* 69*  CREATININE 2.59* 2.68*  CALCIUM 8.9 8.2*   PT/INR No results for input(s): LABPROT, INR in the last 72 hours. CMP     Component Value Date/Time   NA 143 07/10/2018 0452   NA 143 07/03/2018   K 4.1 07/10/2018 0452   CL 103 07/10/2018 0452   CO2 29 07/10/2018 0452   GLUCOSE 125 (H) 07/10/2018 0452   BUN 69 (H) 07/10/2018 0452   BUN 52 (A) 07/03/2018   CREATININE 2.68 (H) 07/10/2018 0452   CREATININE 1.32 (H) 06/17/2015 1257   CALCIUM 8.2 (L) 07/10/2018 0452   PROT 4.9 (L) 07/10/2018 0452   ALBUMIN 2.2 (L) 07/10/2018 0452   AST 23 07/10/2018 0452   ALT 9 07/10/2018 0452   ALKPHOS 43 07/10/2018 0452   BILITOT 1.2 07/10/2018 0452   GFRNONAA 18 (L) 07/10/2018 0452   GFRAA 20 (L)  07/10/2018 0452   Lipase     Component Value Date/Time   LIPASE 305 (H) 07/08/2018 1152       Studies/Results: Dg Abdomen Acute W/chest  Result Date: 07/08/2018 CLINICAL DATA:  Right lower quadrant pain. EXAM: DG ABDOMEN ACUTE W/ 1V CHEST COMPARISON:  Chest x-ray dated June 17, 2018. Abdominal x-ray dated June 15, 2018. FINDINGS: Stable cardiomegaly status post CABG, aortic valve replacement, and left atrial appendage clipping. Worsened pulmonary vascular congestion with new mild interstitial edema. No focal consolidation, pleural effusion, or pneumothorax. There is no evidence of dilated bowel loops or free intraperitoneal air. No radiopaque calculi or other significant radiographic abnormality is seen. Unchanged aorto bi-iliac stent graft and left renal artery stent. No acute osseous abnormality. IMPRESSION: 1. Worsening pulmonary vascular congestion with new mild interstitial pulmonary edema. 2. No acute abdominal finding. Electronically Signed   By: Titus Dubin M.D.   On: 07/08/2018 12:41   US Abdomen Limited Ruq  Result Date: 07/08/2018 CLINICAL DATA:  Right upper quadrant pain since last night. EXAM: ULTRASOUND ABDOMEN LIMITED RIGHT UPPER QUADRANT COMPARISON:  CT abdomen 05/28/2018, 04/12/2018 FINDINGS: Gallbladder: Prior cholecystectomy. 7.2 x 3.2 x 3.2 cm thick walled fluid collection with hyperechoic wall and central hypoechogenicity with the hypoechoic area measuring approximately 3.2  x 1.2 x 1.3 cm. Trace amount of fluid outside of the collection. There is no significant surrounding Doppler flow to suggest a phlegmon. Common bile duct: Diameter: 8.7 mm.  Proximal pancreatic duct measures 3.7 mm. Liver: No focal hepatic mass. Intrahepatic biliary ductal dilatation. Portal vein is patent on color Doppler imaging with normal direction of blood flow towards the liver. No significant perihepatic ascites. IMPRESSION: 1. Prior cholecystectomy. 7.2 x 3.2 x 3.2 cm complex fluid  collection in the gallbladder fossa likely reflecting a postoperative hematoma. The liquified portion measures approximately 3.2 x 1.2 x 1.3 cm which is smaller compared with the prior CT dated 05/28/2018. Electronically Signed   By: Kathreen Devoid   On: 07/08/2018 17:21    Anti-infectives: Anti-infectives (From admission, onward)   None       Assessment/Plan CAD, hx of MI 2012 AAA PAF Chronic diastolic CHF Peripheral artery disease HTN HLD T2DM COPD on home O2 AKI on CKD stage IV Chronic back pain Depression GERD   S/P lap chole 04/19/18 RUQ pain with hyperbilirubinemia ?Acute pancreatitis - lipase 305 on 11/2 - CT 11/2 - 7.2x3.xx3.2 complex fluid collection in gallbladder fossa, likely hematoma - MRCP done this AM - retained stone in cystic stump, no choledocholithiasis, fluid in gallbladder fossa concerning for possible post-op bile leak - Tbili 1.2, AST/ALT normal - will consult GI to see about possible ERCP for stent, but unsure if this is really needed  Leukocytosis - WBC 16, Tmax 100.6, will ask IR to see about draining fluid collection   FEN: NPO, IVF VTE: SCDs, SQ heparin, plavix ID: [email or web address] 11/4>>  Keep NPO for now, continue to hold plavix. If no procedures today then patient could have CLD. IR and GI consults pending. Abx for leukocytosis with low grade temp. Repeat lipase.   LOS: 2 days    Brigid Re , [facility] 07/10/2018, 9:16 AM Pager: [phone] Consults: [phone] Mon-Fri 7:00 am-4:30 pm Sat-Sun 7:00 am-11:30 am

## 2018-07-10 NOTE — Progress Notes (Addendum)
Pharmacy Antibiotic Note  [name] is a 67 y.o. female admitted on 07/08/2018 with RUQ abdominal pain. MRCP done this AM revealed retained stone in cystic duct stump, no choledocholithiasis, fluid in gallbladder fossa concerning for possible post-op bile leak. Given fever and leukocytosis, Zosyn ordered per MD, with pharmacy consulted to assist with dosing.  2nd shift update:  CT shows lower lobe consolidation compatible with pneumonia.  Pharmacy consulted to dose Vancomyin.  Plan: Zosyn 2.25g IV q8h (to start after blood cultures drawn).  Monitor renal function, cultures, clinical course.  Vancomycin 1250mg  IV x 1 loading dose followed by 750mg  IV q48h (Goal AUC)  Follow SCr daily while on both Vanc & Zosyn  Height: 5\' 5"  (165.1 cm) Weight: 134 lb 14.7 oz (61.2 kg) IBW/kg (Calculated) : 57  Temp (24hrs), Avg:99.3 F (37.4 C), Min:98.6 F (37 C), Max:100.6 F (38.1 C)  Recent Labs  Lab 07/08/18 1152 07/08/18 1201 07/08/18 1404 07/09/18 0510 07/10/18 0452  WBC 12.0*  --   --  8.9 16.0*  CREATININE 2.69*  --   --  2.59* 2.68*  LATICACIDVEN  --  1.10 0.68  --   --     Estimated Creatinine Clearance: 18.8 mL/min (A) (by C-G formula based on SCr of 2.68 mg/dL (H)).    Allergies  Allergen Reactions  . Isosorbide Other (See Comments)    Can only tolerate in low doses  . Isosorbide Nitrate Other (See Comments)    Can only tolerate in low doses  . Oxycodone-Acetaminophen Itching  . Codeine Itching  . Contrast Media [Iodinated Diagnostic Agents] Itching and Other (See Comments)    Itching of feet  . Ioxaglate Itching and Other (See Comments)    Itching of feet  . Metrizamide Itching and Other (See Comments)    Itching of feet  . Percocet [Oxycodone-Acetaminophen] Itching and Other (See Comments)    Tolerates Hydrocodone    Antimicrobials this admission: 11/4 Zosyn >> 11/4 Vanc >>  Dose adjustments this admission: --  Microbiology results: 11/2 MRSA PCR: negative   11/4 BCx: NG to date  Thank you for allowing pharmacy to be a part of this patient's care.   Leone Haven, PharmD 07/10/2018 12:39 PM

## 2018-07-10 NOTE — Progress Notes (Signed)
Pt admitted from [facility] where she had entered 06/26/18 for rehab following a hospitalization at [facility]. Spoke with pt's daughter Almyra Free today- she reports she and sister are tentatively planning for pt to return to [facility] at DC if needed/authorized, will follow and assess pt's needs once worked-up inpatient complete. CSW spoke with facility - will coordinate once DC needs known.   Would need new [employer] Medicare authorization if returns to rehab.  See complete assessment below from recent hospitalization for brief hx.  Sharren Bridge, MSW, LCSW Clinical Social Work 07/10/2018 [phone]  Clinical Social Work Assessment  Patient Details  Name: [name] MRN: [id number] Date of Birth: 08/09/1951  Date of referral:  06/19/18               Reason for consult:  Facility Placement                 Permission sought to share information with:  Facility Sport and exercise psychologist, Family Supports Permission granted to share information::  Yes, Verbal Permission Granted             Name::     Marine scientist::  SNFs             Relationship::  Daughter             Contact Information:  [phone]  Housing/Transportation Living arrangements for the past 2 months:  [place] of Information:  Adult Children, Patient Patient Interpreter Needed:  None Criminal Activity/Legal Involvement Pertinent to Current Situation/Hospitalization:  No - Comment as needed Significant Relationships:  Adult Children Lives with:  Adult Children Do you feel safe going back to the place where you live?  No Need for family participation in patient care:  Yes (Comment)  Care giving concerns:  CSW received consult for possible SNF placement at time of discharge. CSW spoke with patient's daughter regarding PT recommendation of SNF placement at time of discharge. Patient's daughter reported that she and her sister are currently unable to care for patient at their home given  patient's current physical needs and fall risk. Patient expressed understanding of PT recommendation and is agreeable to SNF placement at time of discharge. CSW to continue to follow and assist with discharge planning needs.   Social Worker assessment / plan:  CSW spoke with patient and daughter concerning possibility of rehab at [facility] before returning home.  Employment status:  Retired Nurse, adult PT Recommendations:  [place] / Referral to community resources:  [place]  Patient/Family's Response to care:  Patient recognizes need for rehab before returning home and is agreeable to a SNF in [place]. CSW reaching out to SNF to see which is in network with [employer], since patient had trouble being placed before.   Patient/Family's Understanding of and Emotional Response to Diagnosis, Current Treatment, and Prognosis:  Patient/family is realistic regarding therapy needs and expressed being hopeful for SNF placement. Patient expressed understanding of CSW role and discharge process as well as medical condition. No questions/concerns about plan or treatment.    Emotional Assessment Appearance:  Appears stated age Attitude/Demeanor/Rapport:  Lethargic Affect (typically observed):  Quiet Orientation:  Oriented to Self, Oriented to Place, Oriented to  Time, Oriented to Situation Alcohol / Substance use:  Not Applicable Psych involvement (Current and /or in the community):  No (Comment)  Discharge Needs  Concerns to be  addressed:  Care Coordination Readmission within the last 30 days:  No Current discharge risk:  None Barriers to Discharge:  Continued Medical Work up   [employer], LCSW 06/19/2018, 12:40 PM

## 2018-07-10 NOTE — Progress Notes (Signed)
Formal consult tomorrow, after I can review tonight's CT scan images.

## 2018-07-10 NOTE — Progress Notes (Signed)
Pharmacy Antibiotic Note  [name] is a 67 y.o. female admitted on 07/08/2018 with RUQ abdominal pain. MRCP done this AM revealed retained stone in cystic duct stump, no choledocholithiasis, fluid in gallbladder fossa concerning for possible post-op bile leak. Given fever and leukocytosis, Zosyn ordered per MD, with pharmacy consulted to assist with dosing.  Plan: Zosyn 2.25g IV q8h (to start after blood cultures drawn).  Monitor renal function, cultures, clinical course.   Height: 5\' 5"  (165.1 cm) Weight: 134 lb 14.7 oz (61.2 kg) IBW/kg (Calculated) : 57  Temp (24hrs), Avg:99.5 F (37.5 C), Min:98.8 F (37.1 C), Max:100.6 F (38.1 C)  Recent Labs  Lab 07/08/18 1152 07/08/18 1201 07/08/18 1404 07/09/18 0510 07/10/18 0452  WBC 12.0*  --   --  8.9 16.0*  CREATININE 2.69*  --   --  2.59* 2.68*  LATICACIDVEN  --  1.10 0.68  --   --     Estimated Creatinine Clearance: 18.8 mL/min (A) (by C-G formula based on SCr of 2.68 mg/dL (H)).    Allergies  Allergen Reactions  . Isosorbide Other (See Comments)    Can only tolerate in low doses  . Isosorbide Nitrate Other (See Comments)    Can only tolerate in low doses  . Oxycodone-Acetaminophen Itching  . Codeine Itching  . Contrast Media [Iodinated Diagnostic Agents] Itching and Other (See Comments)    Itching of feet  . Ioxaglate Itching and Other (See Comments)    Itching of feet  . Metrizamide Itching and Other (See Comments)    Itching of feet  . Percocet [Oxycodone-Acetaminophen] Itching and Other (See Comments)    Tolerates Hydrocodone    Antimicrobials this admission: 11/4 Zosyn >>  Dose adjustments this admission: --  Microbiology results: 11/2 MRSA PCR: negative  11/4 BCx: sent  Thank you for allowing pharmacy to be a part of this patient's care.   Lindell Spar, PharmD, BCPS Pager: [phone] 07/10/2018 12:39 PM

## 2018-07-11 ENCOUNTER — Encounter: Payer: Self-pay | Admitting: Gastroenterology

## 2018-07-11 DIAGNOSIS — R188 Other ascites: Secondary | ICD-10-CM

## 2018-07-11 DIAGNOSIS — R1011 Right upper quadrant pain: Secondary | ICD-10-CM

## 2018-07-11 DIAGNOSIS — K8020 Calculus of gallbladder without cholecystitis without obstruction: Secondary | ICD-10-CM

## 2018-07-11 LAB — COMPREHENSIVE METABOLIC PANEL
ALBUMIN: 2.5 g/dL — AB (ref 3.5–5.0)
ALK PHOS: 46 U/L (ref 38–126)
ALT: 10 U/L (ref 0–44)
ANION GAP: 10 (ref 5–15)
AST: 23 U/L (ref 15–41)
BILIRUBIN TOTAL: 0.7 mg/dL (ref 0.3–1.2)
BUN: 64 mg/dL — AB (ref 8–23)
CALCIUM: 8.1 mg/dL — AB (ref 8.9–10.3)
CO2: 30 mmol/L (ref 22–32)
Chloride: 97 mmol/L — ABNORMAL LOW (ref 98–111)
Creatinine, Ser: 2.56 mg/dL — ABNORMAL HIGH (ref 0.44–1.00)
GFR calc Af Amer: 22 mL/min — ABNORMAL LOW (ref >60)
GFR calc non Af Amer: 19 mL/min — ABNORMAL LOW (ref >60)
Glucose, Bld: 133 mg/dL — ABNORMAL HIGH (ref 70–99)
Potassium: 4.2 mmol/L (ref 3.5–5.1)
SODIUM: 137 mmol/L (ref 135–145)
TOTAL PROTEIN: 5.4 g/dL — AB (ref 6.5–8.1)

## 2018-07-11 LAB — CREATININE, SERUM
CREATININE: 2.67 mg/dL — AB (ref 0.44–1.00)
GFR calc Af Amer: 20 mL/min — ABNORMAL LOW (ref >60)
GFR calc non Af Amer: 18 mL/min — ABNORMAL LOW (ref >60)

## 2018-07-11 LAB — CBC
HCT: 25.3 % — ABNORMAL LOW (ref 36.0–46.0)
HEMOGLOBIN: 7.6 g/dL — AB (ref 12.0–15.0)
MCH: 28.8 pg (ref 26.0–34.0)
MCHC: 30 g/dL (ref 30.0–36.0)
MCV: 95.8 fL (ref 80.0–100.0)
PLATELETS: 270 10*3/uL (ref 150–400)
RBC: 2.64 MIL/uL — AB (ref 3.87–5.11)
RDW: 17.4 % — ABNORMAL HIGH (ref 11.5–15.5)
WBC: 12.1 10*3/uL — ABNORMAL HIGH (ref 4.0–10.5)
nRBC: 0 % (ref 0.0–0.2)

## 2018-07-11 MED ORDER — PIPERACILLIN-TAZOBACTAM IN DEX 2-0.25 GM/50ML IV SOLN
2.2500 g | Freq: Four times a day (QID) | INTRAVENOUS | Status: DC
  Administered 2018-07-11 – 2018-07-13 (×9): 2.25 g via INTRAVENOUS
  Filled 2018-07-11 (×10): qty 50

## 2018-07-11 MED ORDER — CLOPIDOGREL BISULFATE 75 MG PO TABS
75.0000 mg | ORAL_TABLET | Freq: Every day | ORAL | Status: DC
  Administered 2018-07-12 – 2018-07-19 (×8): 75 mg via ORAL
  Filled 2018-07-11 (×8): qty 1

## 2018-07-11 MED ORDER — HYDROMORPHONE HCL 1 MG/ML IJ SOLN
1.0000 mg | INTRAMUSCULAR | Status: DC | PRN
  Administered 2018-07-11 – 2018-07-12 (×4): 1 mg via INTRAVENOUS
  Filled 2018-07-11 (×4): qty 1

## 2018-07-11 NOTE — Progress Notes (Signed)
[facility] Progress Note     Subjective: CC: RUQ pain Pain unchanged. Patient denies nausea or vomiting. Tolerating CLD. Having diarrhea after contrast yesterday. Denies chest pain or increased SOB.   Objective: Vital signs in last 24 hours: Temp:  [98.6 F (37 C)-99.4 F (37.4 C)] 98.7 F (37.1 C) (11/05 0524) Pulse Rate:  [60-132] 61 (11/05 0727) Resp:  [16-18] 17 (11/05 0727) BP: (113-153)/(46-61) 135/54 (11/05 0524) SpO2:  [91 %-99 %] 98 % (11/05 0727) Last BM Date: 07/07/18  Intake/Output from previous day: 11/04 0701 - 11/05 0700 In: 210 [I.V.:110; IV Piggyback:100] Out: 775 [Urine:775] Intake/Output this shift: No intake/output data recorded.  PE: Gen:  Alert, NAD, pleasant Card:  Regular rate and rhythm Pulm:  Normal effort, clear to auscultation bilaterally, on supplemental oxygen Abd: Soft, TTP in RUQ with some milder generalized TTP, non-distended, bowel sounds present, no HSM, incisions well healed Skin: warm and dry, no rashes  Psych: A&Ox3   Lab Results:  Recent Labs    07/09/18 0510 07/10/18 0452  WBC 8.9 16.0*  HGB 8.9* 7.4*  HCT 30.3* 25.2*  PLT 377 258   BMET Recent Labs    07/09/18 0510 07/10/18 0452 07/11/18 0453  NA 144 143  --   K 3.8 4.1  --   CL 101 103  --   CO2 32 29  --   GLUCOSE 132* 125*  --   BUN 66* 69*  --   CREATININE 2.59* 2.68* 2.67*  CALCIUM 8.9 8.2*  --    PT/INR No results for input(s): LABPROT, INR in the last 72 hours. CMP     Component Value Date/Time   NA 143 07/10/2018 0452   NA 143 07/03/2018   K 4.1 07/10/2018 0452   CL 103 07/10/2018 0452   CO2 29 07/10/2018 0452   GLUCOSE 125 (H) 07/10/2018 0452   BUN 69 (H) 07/10/2018 0452   BUN 52 (A) 07/03/2018   CREATININE 2.67 (H) 07/11/2018 0453   CREATININE 1.32 (H) 06/17/2015 1257   CALCIUM 8.2 (L) 07/10/2018 0452   PROT 4.9 (L) 07/10/2018 0452   ALBUMIN 2.2 (L) 07/10/2018 0452   AST 23 07/10/2018 0452   ALT 9 07/10/2018 0452   ALKPHOS  43 07/10/2018 0452   BILITOT 1.2 07/10/2018 0452   GFRNONAA 18 (L) 07/11/2018 0453   GFRAA 20 (L) 07/11/2018 0453   Lipase     Component Value Date/Time   LIPASE 124 (H) 07/10/2018 0452       Studies/Results: Ct Abdomen Pelvis Wo Contrast  Result Date: 07/10/2018 CLINICAL DATA:  67 year old female with new pain and fever. Also for follow-up of abdominal collection between the duodenum and pancreatic head. EXAM: CT ABDOMEN AND PELVIS WITHOUT CONTRAST TECHNIQUE: Multidetector CT imaging of the abdomen and pelvis was performed following the standard protocol without IV contrast. COMPARISON:  07/10/2018 MR, 07/08/2018 ultrasound, 05/28/2018 CT and prior studies FINDINGS: Please note that parenchymal abnormalities may be missed without intravenous contrast. Lower chest: New airspace disease and consolidation within the LEFT LOWER lobe is compatible with pneumonia. A trace LEFT pleural effusion is noted. Cardiomegaly is identified. Hepatobiliary: The liver is unremarkable. The patient is status post cholecystectomy. No biliary dilatation. Pancreas: Unchanged. Ill-defined 4-5 cm heterogeneous structure in the region the pancreatic head/descending duodenum is unchanged measuring approximately 5 cm. No pancreatic ductal dilatation. Spleen: Unremarkable Adrenals/Urinary Tract: Punctate nonobstructing bilateral renal calculi are again identified. No acute abnormality. Foley catheter in the bladder is noted. Fullness of the  LEFT adrenal gland again noted. Stomach/Bowel: No significant change. No bowel obstruction. Fullness/indistinctness along the descending duodenum again noted. Vascular/Lymphatic: Aorto bi-iliac stent graft again identified. Native infrarenal suprailiac abdominal aortic aneurysm sac measures 5 cm. Renal stent again noted. No enlarged lymph nodes identified. Reproductive: Status post hysterectomy. No adnexal masses. Other: Diffuse subcutaneous edema noted. No ascites, new collection or  pneumoperitoneum. Musculoskeletal: No acute or suspicious bony abnormalities. IMPRESSION: 1. New LEFT LOWER pneumonia. 2. Ill-defined 4-5 cm heterogeneous structure along the pancreatic head/descending duodenum which may represent postoperative changes, hematoma or biloma. Infection is considered less likely. 3. Diffuse subcutaneous edema 4. Aorto bi-iliac stent graft 5. Punctate nonobstructing bilateral renal calculi Electronically Signed   By: Margarette Canada M.D.   On: 07/10/2018 18:15   Mr Abdomen Mrcp Wo Contrast  Result Date: 07/10/2018 CLINICAL DATA:  Right upper quadrant pain. Gallstone pancreatitis. 2 months postop from cholecystectomy. Chronic kidney disease. EXAM: MRI ABDOMEN WITHOUT CONTRAST  (INCLUDING MRCP) TECHNIQUE: Multiplanar multisequence MR imaging of the abdomen was performed. Heavily T2-weighted images of the biliary and pancreatic ducts were obtained, and three-dimensional MRCP images were rendered by post processing. COMPARISON:  CT on 05/28/2018 FINDINGS: Lower chest: No acute findings. Hepatobiliary: No masses visualized on this unenhanced exam. 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 Prior cholecystectomy. Dilated cystic duct stump is seen extending into the gallbladder fossa which contains a residual small calculus measuring approximately 5 mm. A small amount of fluid is seen in the gallbladder fossa and perihepatic space. Common bile duct measures 9 mm in diameter, which is within normal limits status post cholecystectomy. No evidence of choledocholithiasis. Pancreas: A heterogeneous mass with surrounding inflammatory changes is seen which abuts the pancreatic head and descending duodenum. This measures 4.9 x 3.7 cm on image 60/1100, and contains multiple small internal cystic foci. This has mildly decreased in size since previous study when it measured approximately 5.7 x 4.3 cm. Remainder of the pancreas is normal appearance, and there is no  evidence of pancreatic ductal dilatation. Spleen: Within normal limits in size. Small amount of perisplenic fluid. Diffuse T2 hypointensity, consistent with hemosiderosis. Adrenals/Urinary tract: A few small renal cysts are noted bilaterally. No evidence of hydronephrosis. Stomach/Bowel: Marked decrease in wall thickening involving the hepatic flexure of the colon. No evidence of bowel obstruction. Vascular/Lymphatic: No pathologically enlarged lymph nodes identified. Aortic stent graft again noted. Stable size of native abdominal aortic aneurysm measuring 4.9 cm. Other: Diffuse body wall and mesenteric edema. Mild ascites in perihepatic and perisplenic spaces. Musculoskeletal:  No suspicious bone lesions identified. IMPRESSION: Prior cholecystectomy. Dilated cystic duct stump in gallbladder fossa containing a 5 mm calculus. No evidence of biliary ductal dilatation or choledocholithiasis. Mild decrease in size of heterogeneous "mass" with internal cystic foci and surrounding inflammatory changes between the descending duodenum and pancreatic head. This may represent a biloma, abscess, or evolving pancreatic pseudocyst. Small amount of fluid in gallbladder fossa and upper abdomen; postop bile leak cannot be excluded. Consider nuclear medicine hepatobiliary scan for further evaluation. Decreased bowel wall thickening involving the hepatic flexure of colon. Aortic stent graft with stable size of 4.9 cm native abdominal aortic aneurysm. Hemosiderosis. Electronically Signed   By: Earle Gell M.D.   On: 07/10/2018 09:23   Mr 3d Recon At Scanner  Result Date: 07/10/2018 CLINICAL DATA:  Right upper quadrant pain. Gallstone pancreatitis. 2 months postop from cholecystectomy. Chronic kidney disease. EXAM: MRI ABDOMEN WITHOUT CONTRAST  (INCLUDING MRCP) TECHNIQUE: Multiplanar multisequence MR imaging  of the abdomen was performed. Heavily T2-weighted images of the biliary and pancreatic ducts were obtained, and  three-dimensional MRCP images were rendered by post processing. COMPARISON:  CT on 05/28/2018 FINDINGS: Lower chest: No acute findings. Hepatobiliary: No masses visualized on this unenhanced exam. 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 Prior cholecystectomy. Dilated cystic duct stump is seen extending into the gallbladder fossa which contains a residual small calculus measuring approximately 5 mm. A small amount of fluid is seen in the gallbladder fossa and perihepatic space. Common bile duct measures 9 mm in diameter, which is within normal limits status post cholecystectomy. No evidence of choledocholithiasis. Pancreas: A heterogeneous mass with surrounding inflammatory changes is seen which abuts the pancreatic head and descending duodenum. This measures 4.9 x 3.7 cm on image 60/1100, and contains multiple small internal cystic foci. This has mildly decreased in size since previous study when it measured approximately 5.7 x 4.3 cm. Remainder of the pancreas is normal appearance, and there is no evidence of pancreatic ductal dilatation. Spleen: Within normal limits in size. Small amount of perisplenic fluid. Diffuse T2 hypointensity, consistent with hemosiderosis. Adrenals/Urinary tract: A few small renal cysts are noted bilaterally. No evidence of hydronephrosis. Stomach/Bowel: Marked decrease in wall thickening involving the hepatic flexure of the colon. No evidence of bowel obstruction. Vascular/Lymphatic: No pathologically enlarged lymph nodes identified. Aortic stent graft again noted. Stable size of native abdominal aortic aneurysm measuring 4.9 cm. Other: Diffuse body wall and mesenteric edema. Mild ascites in perihepatic and perisplenic spaces. Musculoskeletal:  No suspicious bone lesions identified. IMPRESSION: Prior cholecystectomy. Dilated cystic duct stump in gallbladder fossa containing a 5 mm calculus. No evidence of biliary ductal dilatation or  choledocholithiasis. Mild decrease in size of heterogeneous "mass" with internal cystic foci and surrounding inflammatory changes between the descending duodenum and pancreatic head. This may represent a biloma, abscess, or evolving pancreatic pseudocyst. Small amount of fluid in gallbladder fossa and upper abdomen; postop bile leak cannot be excluded. Consider nuclear medicine hepatobiliary scan for further evaluation. Decreased bowel wall thickening involving the hepatic flexure of colon. Aortic stent graft with stable size of 4.9 cm native abdominal aortic aneurysm. Hemosiderosis. Electronically Signed   By: Earle Gell M.D.   On: 07/10/2018 09:23    Anti-infectives: Anti-infectives (From admission, onward)   Start     Dose/Rate Route Frequency Ordered Stop   07/12/18 2000  vancomycin (VANCOCIN) IVPB 750 mg/150 ml premix     750 mg 150 mL/hr over 60 Minutes Intravenous Every 48 hours 07/10/18 1904     07/10/18 2000  piperacillin-tazobactam (ZOSYN) IVPB 2.25 g     2.25 g 100 mL/hr over 30 Minutes Intravenous Every 8 hours 07/10/18 1203     07/10/18 1900  vancomycin (VANCOCIN) 1,250 mg in sodium chloride 0.9 % 250 mL IVPB     1,250 mg 166.7 mL/hr over 90 Minutes Intravenous STAT 07/10/18 1844 07/10/18 2112   07/10/18 1200  piperacillin-tazobactam (ZOSYN) IVPB 2.25 g     2.25 g 100 mL/hr over 30 Minutes Intravenous  Once 07/10/18 1040 07/10/18 1233       Assessment/Plan CAD, hx of MI 2012 AAA PAF Chronic diastolic CHF Peripheral artery disease HTN HLD T2DM COPD on home O2 AKI on CKD stage IV Chronic back pain Depression GERD   S/P lap chole 04/19/18 RUQ pain with hyperbilirubinemia ?Acute pancreatitis - lipase 124 yesterday, trending down - [place] 11/2 - 7.2x3.[id number] complex fluid collection in gallbladder fossa, likely hematoma -  MRCP done this AM - retained stone in cystic stump, no choledocholithiasis, fluid in gallbladder fossa concerning for possible post-op bile leak - CT  11/4 - 4-5 cm heterogeneous structure along pancreatic head/descending duodenum, possible hematoma vs biloma - Tbili 0.7 from 1.2 yesterday - discussed with GI - plan for ERCP tomorrow LLL pneumonia/Leukocytosis - WBC 12.1, afebrile overnight, LLL pneumonia seen on CT yesterday, this seems most likely source  FEN: CLD, IVF VTE: SCDs, SQ heparin, plavix - last dose 11/2 ID: zosyn 11/4>>, vanc 11/4  Plan for ERCP tomorrow. Management of pneumonia per primary service.   LOS: 3 days    Brigid Re , [facility] 07/11/2018, 7:52 AM Pager: [phone] Consults: [phone] Mon-Fri 7:00 am-4:30 pm Sat-Sun 7:00 am-11:30 am

## 2018-07-11 NOTE — Progress Notes (Signed)
Patient ID: [name], female   DOB: [birth year], 67 y.o.   MRN: [id number]  A request for abscess drain has been made. The CT abd/pelv yesterday demonstrates phlegmon in the head of the pancreas and adjacent duodenum but no clear fluid collection for drainage. Furthermore, there is no fluid collection in the GB fossa for drain placement. Drain is not indicated at this time.

## 2018-07-11 NOTE — Consult Note (Signed)
Consultation  Referring Provider: No ref. provider found Primary Care Physician:  Bernerd Limbo, MD Primary Gastroenterologist:  Dr.Gupta ?  Reason for Consultation: Recurrent right upper quadrant pain, cystic duct stone, question biloma, question need for ERCP   HPI: [name] is a 66 y.o. female with complicated past medical history and recent course who we are asked to see regarding recurrent episodes of right upper quadrant pain and abnormal biliary imaging. Patient was initially seen in our office in August 2019 for anemia in setting of chronic Plavix use.  She had undergone abdominal aortic aneurysm repair in May 9470 complicated by large retroperitoneal hematoma.  She was hospitalized in August with fatigue and decline in hemoglobin to 6.8.  EGD was unrevealing at that time, colonoscopy not attempted given previous failed attempt in 2013 by outside gastroenterology due to sigmoid narrowing.  It was also noted at that time that she had had evidence of pancreatitis at this hospitalization in August.  She underwent laparoscopic cholecystectomy on 04/19/2018 She was readmitted in October 2019 with GI bleed.  Repeat EGD was done Dr. Tarri Glenn with finding of esophageal ulcer.  She was also felt to have severe recurrent versus unresolved acute pancreatitis with necrosis and developing pseudocyst on repeat CT imaging..  She eventually improved enough to go to rehab and plan was for her to have follow-up with Dr. Rush Landmark to discuss further imaging follow-up of pancreatic pseudocyst.  She was readmitted on 07/08/2018 with current severe right upper quadrant abdominal pain similar to her previous episodes of both cholecystitis and pancreatitis.  She had associated nausea, leukocytosis, lipase of 305, T bili of 1.3.  Surgery was reconsulted Upper abdominal ultrasound on 07/2018 showed a 7.2 x 3.2 x 3.2 cm complex fluid collection in the gallbladder fossa question postoperative hematoma MRI/MRCP  yesterday we will do a dilated cystic duct stump containing a 5 mm stone no evidence of biliary ductal dilation or choledocholithiasis.  There was some decrease in the size of the heterogeneous collection with internal cystic foci and surrounding inflammatory changes between the descending duodenum and pancreatic head.  This is felt most likely to represent a biloma versus evolving pseudocyst.  Could not rule out postop bile leak.  There is decreased bowel wall thickening of the hepatic flexure, and stable aortic stent graft.  CT of the abdomen and pelvis was done yesterday as well for comparison, and this was reviewed by Dr. Ardis Hughs with radiology.  She has a new left lower lobe pneumonia, and an ill-defined 4 to 5 cm heterogeneous structure along the pancreatic head/descending duodenum reflecting postoperative fluid/hematoma or biloma.  No ductal dilation.  Patient continues to have upper abdominal pain and is requiring IV narcotics for pain control.  She has intermittent episodes of more severe pain.  Appetite very poor.  Most recent labs reviewed, she has persistent leukocytosis in the 12 range, hemoglobin has drifted down to 7.6 today.  BUN 64/creatinine 2.5 and LFTs are within normal limits.  Lipase yesterday down to 124.  Patient has multiple serious comorbidities including coronary artery disease, status post CABG, prior MI, again she status post abdominal aortic aneurysm repair May 2019, chronic kidney disease with creatinine 2.5, congestive heart failure, COPD requiring home O2, peripheral arterial disease, and chronic antiplatelet therapy.     Past Medical History:  Diagnosis Date  . AAA (abdominal aortic aneurysm) ([place]) 5/09   3.7x3.3 by u/s 2009  . Abnormal EKG    deep TW inversions chronic  .  Anemia   . CAD (coronary artery disease)    a. CABG 03/2011 LIMA to LAD, SVG to OM, SVG to PDA. b. cath 06/25/2015 DES to SVG to rPDA, patent LIMA to LAD, patent SVG to OM  . carotid stenosis  5/09   S/p L CEA ;  60-79% bilat ICA by preCABG dopplers 7/12  . CHF (congestive heart failure) ([place])   . Chronic back pain    "all over" (06/24/2015)  . Chronic bronchitis ([place])    "get it pretty much q yr" (06/24/2015)  . CKD (chronic kidney disease)   . Complication of anesthesia 1966   "problem w/ether"  . COPD (chronic obstructive pulmonary disease) ([place])   . Depression   . GERD (gastroesophageal reflux disease)    With hiatal hernia  . GIB (gastrointestinal bleeding) 9/11   S/p EGD with cautery at [facility]  . Heart murmur   . History of blood transfusion "a few times"   "all related to anemia" (06/24/2015)  . History of hiatal hernia   . Hyperlipidemia   . Hypertension   . Mitral regurgitation    3+ MR by intraoperative TEE;  s/p MV repair with Dr. Roxy Manns 7/12  . Myocardial infarction [facility]) 2012 "several"  . On home oxygen therapy    "2 liters at night; negative for sleep apnea"  . PAD (peripheral artery disease) ([facility])    Severe; s/p bilateral renal artery stents, moderate in-stent restenosis  . Pancreatitis 05/2018  . Paroxysmal atrial fibrillation ([facility])    coumadin;  echo 9/07: EF 60%, mild LVH;  s/p Cox Maze 7/12 with LAA clipping  . Subclavian artery stenosis, left ([facility])    stented by Dr. Trula Slade on 10/18 to help flow of her LIMA to LAD  . Type II diabetes mellitus ([place])     Past Surgical History:  Procedure Laterality Date  . A/V FISTULAGRAM Left 03/21/2018   Procedure: A/V FISTULAGRAM;  Surgeon: Serafina Mitchell, MD;  Location: [place] [facility];  Service: Cardiovascular;  Laterality: Left;  . ABDOMINAL AORTIC ENDOVASCULAR STENT GRAFT N/A 01/17/2018   Procedure: ABDOMINAL AORTIC ENDOVASCULAR STENT GRAFT WITH CO2;  Surgeon: Serafina Mitchell, MD;  Location: [place];  Service: Vascular;  Laterality: N/A;  . ABDOMINAL HYSTERECTOMY  1990's  . APPENDECTOMY  Aug. 11, 2016   Ruptured  . AV FISTULA PLACEMENT Left 11/03/2017   Procedure: ARTERIOVENOUS (AV) FISTULA CREATION LEFT  ARM;  Surgeon: Serafina Mitchell, MD;  Location: [place];  Service: Vascular;  Laterality: Left;  . BOWEL RESECTION  2013  . CARDIAC CATHETERIZATION N/A 06/24/2015   Procedure: Left Heart Cath and Cors/Grafts Angiography;  Surgeon: Leonie Man, MD;  Location: [place] [facility];  Service: Cardiovascular;  Laterality: N/A;  . CARDIAC CATHETERIZATION N/A 06/25/2015   Procedure: Coronary Stent Intervention;  Surgeon: Sherren Mocha, MD;  Location: [place] [facility];  Service: Cardiovascular;  Laterality: N/A;  . CAROTID ENDARTERECTOMY Left   . CATARACT EXTRACTION Right 03/27/2018  . CHOLECYSTECTOMY N/A 04/19/2018   Procedure: LAPAROSCOPIC CHOLECYSTECTOMY WITH INTRAOPERATIVE CHOLANGIOGRAM;  Surgeon: Rolm Bookbinder, MD;  Location: [place];  Service: General;  Laterality: N/A;  . CORONARY ANGIOPLASTY    . CORONARY ARTERY BYPASS GRAFT  03/05/2011   CABG X3 (LIMA to LAD, SVG to OM, SVG to PDA, EVH via left thigh  . CORONARY STENT PLACEMENT    . ESOPHAGOGASTRODUODENOSCOPY N/A 04/12/2018   Procedure: ESOPHAGOGASTRODUODENOSCOPY (EGD);  Surgeon: [name], MD;  Location: [facility] ENDOSCOPY;  Service: Endoscopy;  Laterality: N/A;  . ESOPHAGOGASTRODUODENOSCOPY N/A 06/16/2018   Procedure: ESOPHAGOGASTRODUODENOSCOPY (EGD);  Surgeon: Thornton Park, MD;  Location: [place];  Service: Gastroenterology;  Laterality: N/A;  . LACERATION REPAIR Right 1990's   WRIST  . MAZE  03/05/2011   complete biatrial lesion set with clipping of LA appendage  . MITRAL VALVE REPAIR  03/05/2011   42mm Memo 3D ring annuloplasty for ischemic MR  . PERIPHERAL VASCULAR BALLOON ANGIOPLASTY Left 03/21/2018   Procedure: PERIPHERAL VASCULAR BALLOON ANGIOPLASTY;  Surgeon: Serafina Mitchell, MD;  Location: [place] [facility];  Service: Cardiovascular;  Laterality: Left;  . PERIPHERAL VASCULAR CATHETERIZATION N/A 06/24/2015   Procedure: Aortic Arch Angiography;  Surgeon: Serafina Mitchell, MD;  Location: [place] [facility];  Service:  Cardiovascular;  Laterality: N/A;  . PERIPHERAL VASCULAR CATHETERIZATION  06/24/2015   Procedure: Peripheral Vascular Intervention;  Surgeon: Serafina Mitchell, MD;  Location: [place] [facility];  Service: Cardiovascular;;  . PERIPHERAL VASCULAR CATHETERIZATION N/A 06/24/2015   Procedure: Abdominal Aortogram;  Surgeon: Serafina Mitchell, MD;  Location: [place] [facility];  Service: Cardiovascular;  Laterality: N/A;  . RENAL ANGIOGRAM Bilateral 09/11/2013   Procedure: RENAL ANGIOGRAM;  Surgeon: Serafina Mitchell, MD;  [facility] C[facility] LAB;  Service: Cardiovascular;  Laterality: Bilateral;  . RIGHT FEMORAL-POPLITEAL BYPASS      Prior to Admission medications   Medication Sig Start Date End Date Taking? Authorizing Provider  acetaminophen (TYLENOL) 325 MG tablet Take 325 mg by mouth every 6 (six) hours as needed (for pain).   Yes [provider]  albuterol (PROVENTIL HFA;VENTOLIN HFA) 108 (90 Base) MCG/ACT inhaler Inhale 2 puffs into the lungs every 4 (four) hours as needed for wheezing or shortness of breath. ProAir Patient taking differently: Inhale 2 puffs into the lungs every 4 (four) hours as needed for wheezing or shortness of breath.  10/08/15  Yes Delfina Redwood, MD  allopurinol (ZYLOPRIM) 100 MG tablet Take 100 mg by mouth daily. 02/02/17  Yes [provider]  ALPRAZolam Duanne Moron) 0.5 MG tablet Take 0.5 mg by mouth at bedtime.   Yes [provider]  Amino Acids-Protein Hydrolys (FEEDING SUPPLEMENT, PRO-STAT SUGAR FREE 64,) LIQD Take 30 mLs by mouth 3 (three) times daily with meals. 06/26/18  Yes Thurnell Lose, MD  amLODipine (NORVASC) 10 MG tablet Take 10 mg by mouth daily.     Yes [provider]  atorvastatin (LIPITOR) 40 MG tablet Take 40 mg by mouth every evening.  12/07/17  Yes [provider]  budesonide-formoterol (SYMBICORT) 160-4.5 MCG/ACT inhaler Inhale 2 puffs into the lungs 2 (two) times daily as needed (for flares).   Yes [provider]  carvedilol (COREG) 12.5 MG tablet Take 1 tablet (12.5 mg total) by mouth 2 (two) times daily with a meal. 06/26/18  Yes Thurnell Lose, MD  Cholecalciferol (VITAMIN D3) 5000 units TABS Take 5,000 Units by mouth daily.   Yes [provider]  clopidogrel (PLAVIX) 75 MG tablet Take 1 tablet (75 mg total) by mouth daily with breakfast. 06/26/15  Yes Almyra Deforest, PA  docusate sodium (COLACE) 100 MG capsule Take 200 mg by mouth daily as needed (when taking pain medications).    Yes [provider]  DULoxetine (CYMBALTA) 60 MG capsule Take 60 mg by mouth daily.  10/26/17  Yes [provider]  famotidine (PEPCID) 20 MG tablet TAKE 1 TABLET (20 MG TOTAL) BY MOUTH DAILY BEFORE SUPPER. Patient taking differently: Take 20 mg by  mouth daily before supper.  01/27/16  Yes Sherren Mocha, MD  feeding supplement (BOOST / RESOURCE BREEZE) LIQD Take 1 Container by mouth 2 (two) times daily.   Yes [provider]  fenofibrate 160 MG tablet Take 1 tablet (160 mg total) by mouth daily. Patient taking differently: Take 160 mg by mouth at bedtime.  10/08/15  Yes Delfina Redwood, MD  ferrous sulfate 325 (65 FE) MG tablet Take 325 mg by mouth 2 (two) times daily with a meal.   Yes [provider]  furosemide (LASIX) 40 MG tablet Take 1 tablet (40 mg total) by mouth 2 (two) times daily. 06/26/18  Yes Thurnell Lose, MD  gabapentin (NEURONTIN) 300 MG capsule TAKE 1 CAPSULE(300 MG) BY MOUTH TWICE DAILY Patient taking differently: Take 300 mg by mouth 2 (two) times daily.  05/05/18  Yes Serafina Mitchell, MD  hydrALAZINE (APRESOLINE) 25 MG tablet Take 1 tablet (25 mg total) by mouth every 8 (eight) hours. 06/26/18  Yes Thurnell Lose, MD  HYDROcodone-acetaminophen (NORCO/VICODIN) 5-325 MG tablet Take 1 tablet by mouth every 6 (six) hours as needed for moderate pain. 06/26/18  Yes Thurnell Lose, MD  isosorbide mononitrate (IMDUR) 30 MG 24 hr tablet Take 0.5  tablets (15 mg total) by mouth daily. 12/02/15  Yes Sherren Mocha, MD  mirtazapine (REMERON) 7.5 MG tablet Take 7.5 mg by mouth at bedtime.  02/15/18 08/14/18 Yes [provider]  nicotine (NICODERM CQ - DOSED IN MG/24 HOURS) 14 mg/24hr patch Place 1 patch (14 mg total) onto the skin daily. 06/26/18  Yes Thurnell Lose, MD  pantoprazole (PROTONIX) 40 MG tablet Take 1 tablet (40 mg total) by mouth 2 (two) times daily. 06/26/18  Yes Thurnell Lose, MD  polyethylene glycol (MIRALAX / GLYCOLAX) packet Take 17 g by mouth daily as needed for mild constipation. 06/26/18  Yes Thurnell Lose, MD  spironolactone (ALDACTONE) 25 MG tablet Take 1 tablet (25 mg total) by mouth daily. 06/26/18  Yes Thurnell Lose, MD  tamsulosin (FLOMAX) 0.4 MG CAPS capsule Take 1 capsule (0.4 mg total) by mouth daily. 06/26/18  Yes Thurnell Lose, MD  vitamin B-12 100 MCG tablet Take 1 tablet (100 mcg total) by mouth daily. 06/26/18  Yes Thurnell Lose, MD    Current Facility-Administered Medications  Medication Dose Route Frequency Provider Last Rate Last Dose  . 0.9 %  sodium chloride infusion   Intravenous PRN Oswald Hillock, MD 10 mL/hr at 07/11/18 0300    . acetaminophen (TYLENOL) tablet 650 mg  650 mg Oral Q6H PRN Vertis Kelch, NP   650 mg at 07/09/18 2253  . albuterol (PROVENTIL) (2.5 MG/3ML) 0.083% nebulizer solution 2.5 mg  2.5 mg Nebulization Q4H PRN Mariel Aloe, MD      . ALPRAZolam Duanne Moron) tablet 0.5 mg  0.5 mg Oral QHS Mariel Aloe, MD   0.5 mg at 07/10/18 2135  . amLODipine (NORVASC) tablet 10 mg  10 mg Oral Daily Mariel Aloe, MD   10 mg at 07/11/18 0935  . atorvastatin (LIPITOR) tablet 40 mg  40 mg Oral QPM Mariel Aloe, MD   40 mg at 07/10/18 1736  . bisacodyl (DULCOLAX) suppository 10 mg  10 mg Rectal Daily PRN Mariel Aloe, MD      . carvedilol (COREG) tablet 12.5 mg  12.5 mg Oral BID WC Mariel Aloe, MD   12.5 mg at 07/11/18 0935  . famotidine (PEPCID)  tablet 20 mg  20 mg Oral QAC supper Mariel Aloe, MD   20 mg at 07/10/18 1736  . furosemide (LASIX) tablet 40 mg  40 mg Oral BID Mariel Aloe, MD   40 mg at 07/11/18 0934  . heparin injection 5,000 Units  5,000 Units Subcutaneous Q8H Rayburn, Kelly A, PA-C   5,000 Units at 07/11/18 0523  . hydrALAZINE (APRESOLINE) tablet 25 mg  25 mg Oral Q8H Mariel Aloe, MD   25 mg at 07/11/18 0523  . HYDROmorphone (DILAUDID) injection 1 mg  1 mg Intravenous Q4H PRN Mariel Aloe, MD   1 mg at 07/11/18 0736  . isosorbide mononitrate (IMDUR) 24 hr tablet 15 mg  15 mg Oral Daily Mariel Aloe, MD   15 mg at 07/11/18 0935  . loperamide (IMODIUM) capsule 2 mg  2 mg Oral Daily PRN Oswald Hillock, MD   2 mg at 07/10/18 1939  . MEDLINE mouth rinse  15 mL Mouth Rinse BID Mariel Aloe, MD   15 mL at 07/11/18 0935  . mirtazapine (REMERON) tablet 7.5 mg  7.5 mg Oral QHS Mariel Aloe, MD   7.5 mg at 07/10/18 2135  . mometasone-formoterol (DULERA) 200-5 MCG/ACT inhaler 2 puff  2 puff Inhalation BID Mariel Aloe, MD   2 puff at 07/11/18 0727  . nicotine (NICODERM CQ - dosed in mg/24 hours) patch 14 mg  14 mg Transdermal Daily Mariel Aloe, MD   14 mg at 07/11/18 0933  . ondansetron (ZOFRAN) tablet 4 mg  4 mg Oral Q6H PRN Mariel Aloe, MD       Or  . ondansetron [facility]) injection 4 mg  4 mg Intravenous Q6H PRN Mariel Aloe, MD      . piperacillin-tazobactam (ZOSYN) IVPB 2.25 g  2.25 g Intravenous Q6H Luiz Ochoa, RPH      . potassium chloride SA (K-DUR,KLOR-CON) CR tablet 20 mEq  20 mEq Oral BID Mariel Aloe, MD   20 mEq at 07/11/18 0934  . [START ON 07/12/2018] vancomycin (VANCOCIN) IVPB 750 mg/150 ml premix  750 mg Intravenous Q48H Poindexter, Leann T, RPH        Allergies as of 07/08/2018 - Review Complete 07/08/2018  Allergen Reaction Noted  . Isosorbide Other (See Comments) 10/28/2015  . Isosorbide nitrate Other (See Comments) 10/28/2015  . Oxycodone-acetaminophen Itching  10/28/2015  . Codeine Itching 10/28/2015  . Contrast media [iodinated diagnostic agents] Itching and Other (See Comments) 05/29/2012  . Ioxaglate Itching and Other (See Comments) 02/24/2015  . Metrizamide Itching and Other (See Comments) 07/03/2014  . Percocet [oxycodone-acetaminophen] Itching and Other (See Comments) 09/04/2013    Family History  Problem Relation Age of Onset  . Emphysema Mother   . Heart disease Mother        before age 20  . Hypertension Mother   . Hyperlipidemia Mother   . Heart attack Mother   . AAA (abdominal aortic aneurysm) Mother        rupture  . Heart attack Father 52  . Emphysema Father   . Heart disease Father        before age 32  . Hyperlipidemia Father   . Hypertension Father   . Peripheral vascular disease Father   . Diabetes Brother   . Heart disease Brother        before age 61  . Hyperlipidemia Brother   . Hypertension Brother   . Heart attack Brother  CABG  . Heart disease Other        Vascular disease in grandparents, uncles and dad  . Colon cancer Neg Hx     Social History   Socioeconomic History  . Marital status: Widowed    Spouse name: Not on file  . Number of children: Not on file  . Years of education: Not on file  . Highest education level: Not on file  Occupational History  . Occupation: Scientist, water quality in past    Employer: DISABLED  Social Needs  . Financial resource strain: Not on file  . Food insecurity:    Worry: Not on file    Inability: Not on file  . Transportation needs:    Medical: Not on file    Non-medical: Not on file  Tobacco Use  . Smoking status: Current Every Day Smoker    Years: 50.00    Types: Cigarettes  . Smokeless tobacco: Never Used  . Tobacco comment: 3/4 pk per day  Substance and Sexual Activity  . Alcohol use: Yes    Alcohol/week: 0.0 standard drinks    Comment: occasionally  . Drug use: Yes    Types: Marijuana  . Sexual activity: Not Currently    Birth control/protection: None    Lifestyle  . Physical activity:    Days per week: Not on file    Minutes per session: Not on file  . Stress: Not on file  Relationships  . Social connections:    Talks on phone: Not on file    Gets together: Not on file    Attends religious service: Not on file    Active member of club or organization: Not on file    Attends meetings of clubs or organizations: Not on file    Relationship status: Not on file  . Intimate partner violence:    Fear of current or ex partner: Not on file    Emotionally abused: Not on file    Physically abused: Not on file    Forced sexual activity: Not on file  Other Topics Concern  . Not on file  Social History Narrative   Widowed in 2009.    Review of Systems: Pertinent positive and negative review of systems were noted in the above HPI section.  All other review of systems was otherwise negative.  Physical Exam: Vital signs in last 24 hours: Temp:  [98.6 F (37 C)-99.4 F (37.4 C)] 98.7 F (37.1 C) (11/05 0524) Pulse Rate:  [60-132] 61 (11/05 0727) Resp:  [16-18] 17 (11/05 0727) BP: (113-153)/(46-61) 135/54 (11/05 0524) SpO2:  [91 %-99 %] 98 % (11/05 0727) Last BM Date: 07/07/18 General:   Alert,  Well-developed, chronically ill-appearing older white female in no acute distress.  Daughter at bedside Head:  Normocephalic and atraumatic. Eyes:  Sclera clear, no icterus.   Conjunctiva pink. Ears:  Normal auditory acuity. Nose:  No deformity, discharge,  or lesions. Mouth:  No deformity or lesions.   Neck:  Supple; no masses or thyromegaly. Lungs:  Clear throughout to auscultation.   No wheezes, crackles, or rhonchi. Heart:  Regular rate and rhythm; no murmurs, clicks, rubs,  or gallops. Abdomen:  Soft,tender right upper quadrant and epigastrium no rebound, BS active,nonpalp mass or hsm.  Incisional ports healing Rectal:  Deferred  Msk:  Symmetrical without gross deformities. . Pulses:  Normal pulses noted. Extremities:  Without clubbing  or edema. Neurologic:  Alert and  oriented x4;  grossly normal neurologically. Skin:  Intact without significant lesions  or rashes.. Psych:  Alert and cooperative. Normal mood and affect.  Intake/Output from previous day: 11/04 0701 - 11/05 0700 In: 210 [I.V.:110; IV Piggyback:100] Out: 775 [Urine:775] Intake/Output this shift: No intake/output data recorded.  Lab Results: Recent Labs    07/09/18 0510 07/10/18 0452 07/11/18 0811  WBC 8.9 16.0* 12.1*  HGB 8.9* 7.4* 7.6*  HCT 30.3* 25.2* 25.3*  PLT 377 258 270   BMET Recent Labs    07/09/18 0510 07/10/18 0452 07/11/18 0453 07/11/18 0811  NA 144 143  --  137  K 3.8 4.1  --  4.2  CL 101 103  --  97*  CO2 32 29  --  30  GLUCOSE 132* 125*  --  133*  BUN 66* 69*  --  64*  CREATININE 2.59* 2.68* 2.67* 2.56*  CALCIUM 8.9 8.2*  --  8.1*   LFT Recent Labs    07/11/18 0811  PROT 5.4*  ALBUMIN 2.5*  AST 23  ALT 10  ALKPHOS 46  BILITOT 0.7   PT/INR No results for input(s): LABPROT, INR in the last 72 hours. Hepatitis Panel No results for input(s): HEPBSAG, HCVAB, HEPAIGM, HEPBIGM in the last 72 hours.   IMPRESSION:   #70 67 year old white female with multiple serious medical problems as outlined above status post laparoscopic cholecystectomy August 2019.  She had admission in mid October with GI bleed secondary to an esophageal ulcer and was also noted to have acute pancreatitis and possible developing pseudocyst at that time. Patient readmitted 3 days ago with recurrent severe right upper quadrant pain and nausea.  She has had extensive imaging this admission which has revealed a retained cystic duct stone 5 mm , No common bile duct dilation or evidence of choledocholithiasis, and a persistent perhaps slightly smaller fluid collection abutting the head of the pancreas/descending duodenum which is felt to be a postoperative collection and does not represent a pancreatic pseudocyst.  IR does not feel this collection is  amenable to drainage.  #2 status post fairly recent abdominal aortic aneurysm repair/May 2019 #3.  Chronic kidney disease-creatinine 2.5 range #4 peripheral arterial disease #5.  Coronary artery disease status post MI and CABG #6.  COPD O2 dependent #7 recent upper GI bleed secondary to esophageal ulcer- #8 anemia with drifting hemoglobin #9 chronic antiplatelet therapy   PLAN: Long discussion with the patient and her daughter today per Dr. Ardis Hughs. We do not have any endoscopic intervention available for removal of a cystic duct stone likely will require surgical removal IR does not feel the fluid collection is amenable to drainage Risks and possible benefits of ERCP were discussed in detail.  It is possible that she has had tiny fragments of stones come through the bile duct contributing to the episode of pancreatitis, but no evidence of dilated duct elevated LFTs or choledocholithiasis at this time.  It is felt to be in the patient's best interest at this point to consider transfer to tertiary care center i.e. [facility] for further management, possible surgery to remove cystic duct stone.  This was discussed in detail with the patient and her daughter who are in agreement and content with this approach. Dr. Ardis Hughs will contact [facility] and we have spoken with the [facility] hospitalist and updated regarding plan.   Cathie Bonnell  07/11/2018, 11:13 AM

## 2018-07-11 NOTE — Progress Notes (Signed)
[facility]  PROGRESS NOTE  [name] UEK:[id number] DOB: 10-30-1950 DOA: 07/08/2018 PCP: Bernerd Limbo, MD   Brief HPI:   67 year old female with a history of AAA, acute cholecystitis status post cholecystectomy, gallstone pancreatitis, CKD stage IV, COPD, CAD, diabetes mellitus type 2, hypertension, pancreatitis with pseudocyst came to hospital with right upper quadrant abdominal pain. In the ED she was found to have elevated lipase, abdominal ultrasound showed fluid collection in the gallbladder fossa.  IR consulted for drainage of the fluid collection.    Subjective   Patient seen and examined, feeling better but still has pain.  Patient says the Dilaudid does not last long.  CT abdomen pelvis yesterday showed left lower lobe pneumonia   Assessment/Plan:     1. Right upper quadrant fluid collection-seen on the abdominal ultrasound, initial plan for  IR guided drainage of the fluid collection.  Lipase is now down to 124.  General surgery was consulted, did recommend getting MRCP.  MRCP showed dilated cystic duct stump and gallbladder fossa containing a 5 mm calculus.  No evidence of bili ductal dilation or choledocholithiasis.  Also a small amount of fluid in the gallbladder fossa likely postop bile leak.  Patient was started on empiric Zosyn.  GI was consulted and at this time GI does not feel that ERCP can be performed at [facility] and need to be transferred to tertiary care center.  GI is arranging for transfer to [facility].  General surgery also following.  Will change Dilaudid to 1 mg every 3 hours as needed  2. Acute kidney injury on CKD stage IV-creatinine stable at 2.56  3. Abdominal aortic aneurysm-patient had recent repair, also has common iliac artery stent placed.  Plavix was initially held in anticipation for surgery, as surgery is not scheduled here will restart Plavix.  4. Chronic diastolic CHF-patient was given IV Lasix x1   Currently on Lasix 40 mg p.o. twice daily.         CBC: Recent Labs  Lab 07/08/18 1152 07/09/18 0510 07/10/18 0452 07/11/18 0811  WBC 12.0* 8.9 16.0* 12.1*  HGB 10.6* 8.9* 7.4* 7.6*  HCT 35.3* 30.3* 25.2* 25.3*  MCV 94.9 97.7 97.7 95.8  PLT 441* 377 258 150    Basic Metabolic Panel: Recent Labs  Lab 07/08/18 1152 07/09/18 0510 07/10/18 0452 07/11/18 0453 07/11/18 0811  NA 142 144 143  --  137  K 3.2* 3.8 4.1  --  4.2  CL 96* 101 103  --  97*  CO2 31 32 29  --  30  GLUCOSE 152* 132* 125*  --  133*  BUN 70* 66* 69*  --  64*  CREATININE 2.69* 2.59* 2.68* 2.67* 2.56*  CALCIUM 9.1 8.9 8.2*  --  8.1*     DVT prophylaxis: Heparin  Code Status: Full code  Family Communication: Discussed with patient's daughter at bedside  Disposition Plan: likely tertiary care center in 1 to 2 days.   Consultants:    Procedures:     Antibiotics:   Anti-infectives (From admission, onward)   Start     Dose/Rate Route Frequency Ordered Stop   07/12/18 2000  vancomycin (VANCOCIN) IVPB 750 mg/150 ml premix     750 mg 150 mL/hr over 60 Minutes Intravenous Every 48 hours 07/10/18 1904     07/11/18 1200  piperacillin-tazobactam (ZOSYN) IVPB 2.25 g     2.25 g 100 mL/hr over 30 Minutes Intravenous Every 6 hours 07/11/18 0827     07/10/18 2000  piperacillin-tazobactam (ZOSYN) IVPB 2.25 g  Status:  Discontinued     2.25 g 100 mL/hr over 30 Minutes Intravenous Every 8 hours 07/10/18 1203 07/11/18 0827   07/10/18 1900  vancomycin (VANCOCIN) 1,250 mg in sodium chloride 0.9 % 250 mL IVPB     1,250 mg 166.7 mL/hr over 90 Minutes Intravenous STAT 07/10/18 1844 07/10/18 2112   07/10/18 1200  piperacillin-tazobactam (ZOSYN) IVPB 2.25 g     2.25 g 100 mL/hr over 30 Minutes Intravenous  Once 07/10/18 1040 07/10/18 1233       Objective   Vitals:   07/11/18 0337 07/11/18 0524 07/11/18 0727 07/11/18 1300  BP:  (!) 135/54  129/61  Pulse: 67 64 61 68  Resp:  16 17 18   Temp:  98.7 F (37.1 C)  98.1 F (36.7 C)  TempSrc:   Oral  Oral  SpO2:  99% 98% 99%  Weight:      Height:        Intake/Output Summary (Last 24 hours) at 07/11/2018 1508 Last data filed at 07/11/2018 0300 Gross per 24 hour  Intake 209.98 ml  Output 425 ml  Net -215.02 ml   Filed Weights   07/08/18 1134  Weight: 61.2 kg     Physical Examination:   Neck: Supple, no deformities, masses, or tenderness Lungs: Normal respiratory effort, bilateral clear to auscultation, no crackles or wheezes.  Heart: Regular rate and rhythm, S1 and S2 normal, no murmurs, rubs auscultated Abdomen: BS normoactive,soft,nondistended,non-tender to palpation,no organomegaly Extremities: No pretibial edema, no erythema, no cyanosis, no clubbing Neuro : Alert and oriented to time, place and person, No focal deficits  Skin: No rashes seen on exam     Data Reviewed: I have personally reviewed following labs and imaging studies   Recent Results (from the past 240 hour(s))  MRSA PCR Screening     Status: None   Collection Time: 07/08/18  6:36 PM  Result Value Ref Range Status   MRSA by PCR NEGATIVE NEGATIVE Final    Comment:        The GeneXpert MRSA Assay (FDA approved for NASAL specimens only), is one component of a comprehensive MRSA colonization surveillance program. It is not intended to diagnose MRSA infection nor to guide or monitor treatment for MRSA infections. Performed at [facility], [place] [address] [place], [place] [address]   Culture, blood (Routine X 2) w Reflex to ID Panel     Status: None (Preliminary result)   Collection Time: 07/10/18 10:36 AM  Result Value Ref Range Status   Specimen Description   Final    BLOOD RIGHT ARM Performed at [place] [address] [place], [place] [address]    Special Requests   Final    BOTTLES DRAWN AEROBIC AND ANAEROBIC Blood Culture adequate volume Performed at [place] [address], [place], [place] [address]     Culture   Final    NO GROWTH 1 DAY Performed at [facility], [place] [address], [place], [place] [address]    Report Status PENDING  Incomplete  Culture, blood (Routine X 2) w Reflex to ID Panel     Status: None (Preliminary result)   Collection Time: 07/10/18 10:38 AM  Result Value Ref Range Status   Specimen Description   Final    BLOOD RIGHT HAND Performed at [facility] [address] [place], [place] [address]    Special Requests   Final  BOTTLES DRAWN AEROBIC ONLY Blood Culture adequate volume Performed at [place] [address], [place], [place] [address]    Culture   Final    NO GROWTH 1 DAY Performed at [facility], [place] [address] [place], [place] [address]    Report Status PENDING  Incomplete     Liver Function Tests: Recent Labs  Lab 07/08/18 1152 07/09/18 0510 07/10/18 0452 07/11/18 0811  AST 22 23 23 23   ALT 9 8 9 10   ALKPHOS 71 62 43 46  BILITOT 1.3* 0.8 1.2 0.7  PROT 6.7 5.6* 4.9* 5.4*  ALBUMIN 3.2* 2.7* 2.2* 2.5*   Recent Labs  Lab 07/08/18 1152 07/10/18 0452  LIPASE 305* 124*   No results for input(s): AMMONIA in the last 168 hours.  Cardiac Enzymes: Recent Labs  Lab 07/08/18 1152  TROPONINI 0.09*   BNP (last 3 results) Recent Labs    04/17/18 0534 04/18/18 0501 05/28/18 2325  BNP 2,002.3* 1,099.7* 2,077.1*    ProBNP (last 3 results) No results for input(s): PROBNP in the last 8760 hours.    Studies: Ct Abdomen Pelvis Wo Contrast  Result Date: 07/10/2018 CLINICAL DATA:  67 year old female with new pain and fever. Also for follow-up of abdominal collection between the duodenum and pancreatic head. EXAM: CT ABDOMEN AND PELVIS WITHOUT CONTRAST TECHNIQUE: Multidetector CT imaging of the abdomen and pelvis was performed following the standard protocol without IV contrast. COMPARISON:  07/10/2018 MR, 07/08/2018 ultrasound, 05/28/2018 CT and prior studies FINDINGS: Please note  that parenchymal abnormalities may be missed without intravenous contrast. Lower chest: New airspace disease and consolidation within the LEFT LOWER lobe is compatible with pneumonia. A trace LEFT pleural effusion is noted. Cardiomegaly is identified. Hepatobiliary: The liver is unremarkable. The patient is status post cholecystectomy. No biliary dilatation. Pancreas: Unchanged. Ill-defined 4-5 cm heterogeneous structure in the region the pancreatic head/descending duodenum is unchanged measuring approximately 5 cm. No pancreatic ductal dilatation. Spleen: Unremarkable Adrenals/Urinary Tract: Punctate nonobstructing bilateral renal calculi are again identified. No acute abnormality. Foley catheter in the bladder is noted. Fullness of the LEFT adrenal gland again noted. Stomach/Bowel: No significant change. No bowel obstruction. Fullness/indistinctness along the descending duodenum again noted. Vascular/Lymphatic: Aorto bi-iliac stent graft again identified. Native infrarenal suprailiac abdominal aortic aneurysm sac measures 5 cm. Renal stent again noted. No enlarged lymph nodes identified. Reproductive: Status post hysterectomy. No adnexal masses. Other: Diffuse subcutaneous edema noted. No ascites, new collection or pneumoperitoneum. Musculoskeletal: No acute or suspicious bony abnormalities. IMPRESSION: 1. New LEFT LOWER pneumonia. 2. Ill-defined 4-5 cm heterogeneous structure along the pancreatic head/descending duodenum which may represent postoperative changes, hematoma or biloma. Infection is considered less likely. 3. Diffuse subcutaneous edema 4. Aorto bi-iliac stent graft 5. Punctate nonobstructing bilateral renal calculi Electronically Signed   By: Margarette Canada M.D.   On: 07/10/2018 18:15   Mr Abdomen Mrcp Wo Contrast  Result Date: 07/10/2018 CLINICAL DATA:  Right upper quadrant pain. Gallstone pancreatitis. 2 months postop from cholecystectomy. Chronic kidney disease. EXAM: MRI ABDOMEN WITHOUT CONTRAST   (INCLUDING MRCP) TECHNIQUE: Multiplanar multisequence MR imaging of the abdomen was performed. Heavily T2-weighted images of the biliary and pancreatic ducts were obtained, and three-dimensional MRCP images were rendered by post processing. COMPARISON:  CT on 05/28/2018 FINDINGS: Lower chest: No acute findings. Hepatobiliary: No masses visualized on this unenhanced exam. 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 Prior cholecystectomy. Dilated cystic duct stump is seen extending into the gallbladder fossa which  contains a residual small calculus measuring approximately 5 mm. A small amount of fluid is seen in the gallbladder fossa and perihepatic space. Common bile duct measures 9 mm in diameter, which is within normal limits status post cholecystectomy. No evidence of choledocholithiasis. Pancreas: A heterogeneous mass with surrounding inflammatory changes is seen which abuts the pancreatic head and descending duodenum. This measures 4.9 x 3.7 cm on image 60/1100, and contains multiple small internal cystic foci. This has mildly decreased in size since previous study when it measured approximately 5.7 x 4.3 cm. Remainder of the pancreas is normal appearance, and there is no evidence of pancreatic ductal dilatation. Spleen: Within normal limits in size. Small amount of perisplenic fluid. Diffuse T2 hypointensity, consistent with hemosiderosis. Adrenals/Urinary tract: A few small renal cysts are noted bilaterally. No evidence of hydronephrosis. Stomach/Bowel: Marked decrease in wall thickening involving the hepatic flexure of the colon. No evidence of bowel obstruction. Vascular/Lymphatic: No pathologically enlarged lymph nodes identified. Aortic stent graft again noted. Stable size of native abdominal aortic aneurysm measuring 4.9 cm. Other: Diffuse body wall and mesenteric edema. Mild ascites in perihepatic and perisplenic spaces. Musculoskeletal:  No suspicious bone lesions  identified. IMPRESSION: Prior cholecystectomy. Dilated cystic duct stump in gallbladder fossa containing a 5 mm calculus. No evidence of biliary ductal dilatation or choledocholithiasis. Mild decrease in size of heterogeneous "mass" with internal cystic foci and surrounding inflammatory changes between the descending duodenum and pancreatic head. This may represent a biloma, abscess, or evolving pancreatic pseudocyst. Small amount of fluid in gallbladder fossa and upper abdomen; postop bile leak cannot be excluded. Consider nuclear medicine hepatobiliary scan for further evaluation. Decreased bowel wall thickening involving the hepatic flexure of colon. Aortic stent graft with stable size of 4.9 cm native abdominal aortic aneurysm. Hemosiderosis. Electronically Signed   By: Earle Gell M.D.   On: 07/10/2018 09:23   Mr 3d Recon At Scanner  Result Date: 07/10/2018 CLINICAL DATA:  Right upper quadrant pain. Gallstone pancreatitis. 2 months postop from cholecystectomy. Chronic kidney disease. EXAM: MRI ABDOMEN WITHOUT CONTRAST  (INCLUDING MRCP) TECHNIQUE: Multiplanar multisequence MR imaging of the abdomen was performed. Heavily T2-weighted images of the biliary and pancreatic ducts were obtained, and three-dimensional MRCP images were rendered by post processing. COMPARISON:  CT on 05/28/2018 FINDINGS: Lower chest: No acute findings. Hepatobiliary: No masses visualized on this unenhanced exam. 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 Prior cholecystectomy. Dilated cystic duct stump is seen extending into the gallbladder fossa which contains a residual small calculus measuring approximately 5 mm. A small amount of fluid is seen in the gallbladder fossa and perihepatic space. Common bile duct measures 9 mm in diameter, which is within normal limits status post cholecystectomy. No evidence of choledocholithiasis. Pancreas: A heterogeneous mass with surrounding inflammatory  changes is seen which abuts the pancreatic head and descending duodenum. This measures 4.9 x 3.7 cm on image 60/1100, and contains multiple small internal cystic foci. This has mildly decreased in size since previous study when it measured approximately 5.7 x 4.3 cm. Remainder of the pancreas is normal appearance, and there is no evidence of pancreatic ductal dilatation. Spleen: Within normal limits in size. Small amount of perisplenic fluid. Diffuse T2 hypointensity, consistent with hemosiderosis. Adrenals/Urinary tract: A few small renal cysts are noted bilaterally. No evidence of hydronephrosis. Stomach/Bowel: Marked decrease in wall thickening involving the hepatic flexure of the colon. No evidence of bowel obstruction. Vascular/Lymphatic: No pathologically enlarged lymph nodes identified. Aortic stent graft again  noted. Stable size of native abdominal aortic aneurysm measuring 4.9 cm. Other: Diffuse body wall and mesenteric edema. Mild ascites in perihepatic and perisplenic spaces. Musculoskeletal:  No suspicious bone lesions identified. IMPRESSION: Prior cholecystectomy. Dilated cystic duct stump in gallbladder fossa containing a 5 mm calculus. No evidence of biliary ductal dilatation or choledocholithiasis. Mild decrease in size of heterogeneous "mass" with internal cystic foci and surrounding inflammatory changes between the descending duodenum and pancreatic head. This may represent a biloma, abscess, or evolving pancreatic pseudocyst. Small amount of fluid in gallbladder fossa and upper abdomen; postop bile leak cannot be excluded. Consider nuclear medicine hepatobiliary scan for further evaluation. Decreased bowel wall thickening involving the hepatic flexure of colon. Aortic stent graft with stable size of 4.9 cm native abdominal aortic aneurysm. Hemosiderosis. Electronically Signed   By: Earle Gell M.D.   On: 07/10/2018 09:23    Scheduled Meds: . ALPRAZolam  0.5 mg Oral QHS  . amLODipine  10 mg  Oral Daily  . atorvastatin  40 mg Oral QPM  . carvedilol  12.5 mg Oral BID WC  . famotidine  20 mg Oral QAC supper  . furosemide  40 mg Oral BID  . heparin  5,000 Units Subcutaneous Q8H  . hydrALAZINE  25 mg Oral Q8H  . isosorbide mononitrate  15 mg Oral Daily  . mouth rinse  15 mL Mouth Rinse BID  . mirtazapine  7.5 mg Oral QHS  . mometasone-formoterol  2 puff Inhalation BID  . nicotine  14 mg Transdermal Daily  . potassium chloride  20 mEq Oral BID    Admission status: Inpatient: Based on patients clinical presentation and evaluation of above clinical data, I have made determination that patient meets Inpatient criteria at this time.  Patient has right upper quadrant fluid collection which will need IR drainage and also surgical evaluation.  Time spent: 25 min  [facility] Pager [phone]. If 7PM-7AM, please contact night-coverage at [email or web address], Office  [phone]  password [place]  07/11/2018, 3:08 PM  LOS: 3 days

## 2018-07-11 NOTE — Progress Notes (Signed)
CMT notified writer of patient HR sustaining in the 130s-140s. VSS, EKG obtained and on call provider notified. While awaiting return call patients HR spontaneously converted back to NSR in the 60s. On call provider aware and will call back if HR jumps back up. Will continue to monitor closely.

## 2018-07-11 NOTE — Progress Notes (Signed)
Pharmacy Antibiotic Note  [name] is a 67 y.o. female admitted on 07/08/2018 with RUQ abdominal pain. MRCP done 11/4 revealed retained stone in cystic duct stump, no choledocholithiasis, fluid in gallbladder fossa concerning for possible post-op bile leak. Given fever and leukocytosis, Zosyn ordered per MD, with pharmacy consulted to assist with dosing. CT of abd/pelvis in PM showed lower lobe consolidation compatible with pneumonia. Vancomycin added per MD.   Plan: Adjust Zosyn to 2.25g IV q6h for PNA indication.   Continue current dose of Vancomycin 750mg  IV q48h.   Monitor renal function (daily SCr), cultures, clinical course.   Height: 5\' 5"  (165.1 cm) Weight: 134 lb 14.7 oz (61.2 kg) IBW/kg (Calculated) : 57  Temp (24hrs), Avg:98.9 F (37.2 C), Min:98.6 F (37 C), Max:99.4 F (37.4 C)  Recent Labs  Lab 07/08/18 1152 07/08/18 1201 07/08/18 1404 07/09/18 0510 07/10/18 0452 07/11/18 0453 07/11/18 0811  WBC 12.0*  --   --  8.9 16.0*  --  12.1*  CREATININE 2.69*  --   --  2.59* 2.68* 2.67* 2.56*  LATICACIDVEN  --  1.10 0.68  --   --   --   --     Estimated Creatinine Clearance: 19.7 mL/min (A) (by C-G formula based on SCr of 2.56 mg/dL (H)).    Allergies  Allergen Reactions  . Isosorbide Other (See Comments)    Can only tolerate in low doses  . Isosorbide Nitrate Other (See Comments)    Can only tolerate in low doses  . Oxycodone-Acetaminophen Itching  . Codeine Itching  . Contrast Media [Iodinated Diagnostic Agents] Itching and Other (See Comments)    Itching of feet  . Ioxaglate Itching and Other (See Comments)    Itching of feet  . Metrizamide Itching and Other (See Comments)    Itching of feet  . Percocet [Oxycodone-Acetaminophen] Itching and Other (See Comments)    Tolerates Hydrocodone    Antimicrobials this admission: 11/4 Zosyn >> 11/4 Vanc >>  Microbiology results: 11/2 MRSA PCR: negative  11/4 BCx: NGTD  Thank you for allowing pharmacy to be a  part of this patient's care.   Lindell Spar, PharmD, BCPS Pager: [phone] 07/11/2018 11:48 AM

## 2018-07-12 ENCOUNTER — Inpatient Hospital Stay (HOSPITAL_COMMUNITY): Payer: Medicare Other

## 2018-07-12 DIAGNOSIS — L8942 Pressure ulcer of contiguous site of back, buttock and hip, stage 2: Secondary | ICD-10-CM

## 2018-07-12 DIAGNOSIS — L8992 Pressure ulcer of unspecified site, stage 2: Secondary | ICD-10-CM | POA: Insufficient documentation

## 2018-07-12 DIAGNOSIS — T888XXA Other specified complications of surgical and medical care, not elsewhere classified, initial encounter: Secondary | ICD-10-CM

## 2018-07-12 LAB — COMPREHENSIVE METABOLIC PANEL
ALT: 9 U/L (ref 0–44)
ANION GAP: 11 (ref 5–15)
AST: 23 U/L (ref 15–41)
Albumin: 2.5 g/dL — ABNORMAL LOW (ref 3.5–5.0)
Alkaline Phosphatase: 43 U/L (ref 38–126)
BUN: 63 mg/dL — ABNORMAL HIGH (ref 8–23)
CHLORIDE: 98 mmol/L (ref 98–111)
CO2: 29 mmol/L (ref 22–32)
CREATININE: 2.71 mg/dL — AB (ref 0.44–1.00)
Calcium: 8.5 mg/dL — ABNORMAL LOW (ref 8.9–10.3)
GFR calc non Af Amer: 17 mL/min — ABNORMAL LOW (ref >60)
GFR, EST AFRICAN AMERICAN: 20 mL/min — AB (ref >60)
Glucose, Bld: 120 mg/dL — ABNORMAL HIGH (ref 70–99)
POTASSIUM: 4.9 mmol/L (ref 3.5–5.1)
SODIUM: 138 mmol/L (ref 135–145)
Total Bilirubin: 0.9 mg/dL (ref 0.3–1.2)
Total Protein: 5.7 g/dL — ABNORMAL LOW (ref 6.5–8.1)

## 2018-07-12 LAB — CBC
HCT: 27.7 % — ABNORMAL LOW (ref 36.0–46.0)
Hemoglobin: 8.3 g/dL — ABNORMAL LOW (ref 12.0–15.0)
MCH: 28.8 pg (ref 26.0–34.0)
MCHC: 30 g/dL (ref 30.0–36.0)
MCV: 96.2 fL (ref 80.0–100.0)
PLATELETS: 279 10*3/uL (ref 150–400)
RBC: 2.88 MIL/uL — AB (ref 3.87–5.11)
RDW: 17.3 % — AB (ref 11.5–15.5)
WBC: 9.9 10*3/uL (ref 4.0–10.5)
nRBC: 0 % (ref 0.0–0.2)

## 2018-07-12 MED ORDER — HYDROMORPHONE HCL 1 MG/ML IJ SOLN
1.0000 mg | INTRAMUSCULAR | Status: DC | PRN
  Administered 2018-07-12 – 2018-07-13 (×9): 1 mg via INTRAVENOUS
  Filled 2018-07-12 (×9): qty 1

## 2018-07-12 MED ORDER — DIPHENHYDRAMINE HCL 25 MG PO CAPS: 25.0000 mg | ORAL_CAPSULE | Freq: Four times a day (QID) | ORAL | Status: DC | PRN

## 2018-07-12 MED ORDER — TECHNETIUM TC 99M MEBROFENIN IV KIT
5.5000 | PACK | Freq: Once | INTRAVENOUS | Status: AC | PRN
  Administered 2018-07-12: 5.5 via INTRAVENOUS

## 2018-07-12 MED ORDER — OXYCODONE HCL 5 MG PO TABS
5.0000 mg | ORAL_TABLET | Freq: Four times a day (QID) | ORAL | Status: DC | PRN
  Administered 2018-07-12: 5 mg via ORAL
  Filled 2018-07-12: qty 1

## 2018-07-12 NOTE — Care Management Important Message (Signed)
Important Message  Patient Details  Name: [name] MRN: [id number] Date of Birth: 1951-03-21   Medicare Important Message Given:  Yes    Kerin Salen 07/12/2018, 2:09 [place] Message  Patient Details  Name: [name] MRN: [id number] Date of Birth: 30-May-1951   Medicare Important Message Given:  Yes    Kerin Salen 07/12/2018, 2:09 PM

## 2018-07-12 NOTE — Progress Notes (Addendum)
PROGRESS NOTE    [name]  JOA:[id number] DOB: Jun 06, 1951 DOA: 07/08/2018 PCP: Bernerd Limbo, MD    Brief Narrative:  67 year old female who presented with right upper quadrant abdominal pain.  She does have significant past medical history for gallstone pancreatitis status post cholecystectomy, chronic kidney disease, COPD, coronary disease, type 2 diabetes mellitus and hypertension.  She developed sharp right upper quadrant abdominal pain, associated with nausea.  On her initial physical examination blood pressure 160/50, her lungs are clear to auscultation bilaterally, heart S1-S2 present, irregular rhythm, abdomen tender to in the right upper quadrant, no lower extremity edema.   Patient was admitted to the hospital with working diagnosis of recurrent pancreatitis  Assessment & Plan:   Active Problems:   RUQ pain   Intraabdominal fluid collection   Cystic duct calculus   Fluid collection at surgical site  1. Acute pancreatitis complicated with pancreatic fluid collection. Patient continue to have significant abdominal pain, increased dose of hydromorphone to 2 mg IV as needed q 2 h, will add scheduled acetaminophen. Today for HIDA scan to rule our biliary leak. Continue antibiotic therapy with IV Zosyn, follow cell count, temperature curve.   2. AKI on stage IV CKD. Patient with poor oral intake, will continue to follow on renal function and electrolytes. Continue furosemide to keep a negative fluid balance.   3. Chronic diastolic heart failure with acute exacerbation. Patient received IV furosemide with improvement of her symptoms, continue bid furosemide. Chest film personally reviewed, no pneumonic infiltrate, will rule out pneumonia and will discontinue IV Vancomycin. Continue carvedilol, hydralazine and isosorbide.   4. Abdominal aortic aneurysm. SP repair and stent to the common iliac artery, continue clopidogrel.  5. Stage 2 sacrum pressure ulcer (present on admission).  Will continue local wound care, frequent turning.   6. HTN. Continue blood pressure control with carvedilol, amlodipine, hydralazine and isosorbide.    DVT prophylaxis: enoxaparin   Code Status: full Family Communication: I spoke with patient's daughter at the bedside and all questions were addressed.  Disposition Plan/ discharge barriers: pending placement  Body mass index is 22.45 kg/m. Malnutrition Type:      Malnutrition Characteristics:      Nutrition Interventions:     RN Pressure Injury Documentation: Pressure Injury 06/15/18 Stage I -  Intact skin with non-blanchable redness of a localized area usually over a bony prominence. moisture associated skin damage related to frequent diarrhea (Active)  06/15/18 1115   Location: Sacrum  Location Orientation: Posterior  Staging: Stage I -  Intact skin with non-blanchable redness of a localized area usually over a bony prominence.  Wound Description (Comments): moisture associated skin damage related to frequent diarrhea  Present on Admission:      Pressure Injury Stage II -  Partial thickness loss of dermis presenting as a shallow open ulcer with a red, pink wound bed without slough. (Active)      Location:   Location Orientation:   Staging: Stage II -  Partial thickness loss of dermis presenting as a shallow open ulcer with a red, pink wound bed without slough.  Wound Description (Comments):   Present on Admission:      Pressure Injury 07/08/18 Stage II -  Partial thickness loss of dermis presenting as a shallow open ulcer with a red, pink wound bed without slough. (Active)  07/08/18 1730   Location: Sacrum  Location Orientation: Posterior  Staging: Stage II -  Partial thickness loss of dermis presenting as a shallow open ulcer  with a red, pink wound bed without slough.  Wound Description (Comments):   Present on Admission: Yes     Consultants:     Procedures:     Antimicrobials:   IV Zosyn      Subjective: Patient continue to have abdominal pain, severe in intensity and persistent, not improved by analgesics, no associated nausea or vomiting, no chest pain or dyspnea. Pain worse with movement.   Objective: Vitals:   07/11/18 2045 07/12/18 0529 07/12/18 0823 07/12/18 1058  BP:  (!) 140/57 (!) 156/57   Pulse:  (!) 58 (!) 59 68  Resp:  16 16 16   Temp:  98.6 F (37 C) 98.1 F (36.7 C)   TempSrc:  Oral Oral   SpO2: 98% 95% 96% 95%  Weight:      Height:        Intake/Output Summary (Last 24 hours) at 07/12/2018 1144 Last data filed at 07/12/2018 5885 Gross per 24 hour  Intake 1202.84 ml  Output 1725 ml  Net -522.16 ml   Filed Weights   07/08/18 1134  Weight: 61.2 kg    Examination:   General: deconditioned and in pain Neurology: Awake and alert, non focal  E ENT: mild pallor, no icterus, oral mucosa moist Cardiovascular: No JVD. S1-S2 present, rhythmic, no gallops, rubs, or murmurs. No lower extremity edema. Pulmonary: positive breath sounds bilaterally, adequate air movement, no wheezing, rhonchi or rales. Gastrointestinal. Abdomen with no organomegaly, mild distended and tender to palpation with no rebound or guarding.  Skin. No rashes Musculoskeletal: no joint deformities     Data Reviewed: I have personally reviewed following labs and imaging studies  CBC: Recent Labs  Lab 07/08/18 1152 07/09/18 0510 07/10/18 0452 07/11/18 0811 07/12/18 0438  WBC 12.0* 8.9 16.0* 12.1* 9.9  HGB 10.6* 8.9* 7.4* 7.6* 8.3*  HCT 35.3* 30.3* 25.2* 25.3* 27.7*  MCV 94.9 97.7 97.7 95.8 96.2  PLT 441* 377 258 270 027   Basic Metabolic Panel: Recent Labs  Lab 07/08/18 1152 07/09/18 0510 07/10/18 0452 07/11/18 0453 07/11/18 0811 07/12/18 0438  NA 142 144 143  --  137 138  K 3.2* 3.8 4.1  --  4.2 4.9  CL 96* 101 103  --  97* 98  CO2 31 32 29  --  30 29  GLUCOSE 152* 132* 125*  --  133* 120*  BUN 70* 66* 69*  --  64* 63*  CREATININE 2.69* 2.59* 2.68* 2.67*  2.56* 2.71*  CALCIUM 9.1 8.9 8.2*  --  8.1* 8.5*   GFR: Estimated Creatinine Clearance: 18.6 mL/min (A) (by C-G formula based on SCr of 2.71 mg/dL (H)). Liver Function Tests: Recent Labs  Lab 07/08/18 1152 07/09/18 0510 07/10/18 0452 07/11/18 0811 07/12/18 0438  AST 22 23 23 23 23   ALT 9 8 9 10 9   ALKPHOS 71 62 43 46 43  BILITOT 1.3* 0.8 1.2 0.7 0.9  PROT 6.7 5.6* 4.9* 5.4* 5.7*  ALBUMIN 3.2* 2.7* 2.2* 2.5* 2.5*   Recent Labs  Lab 07/08/18 1152 07/10/18 0452  LIPASE 305* 124*   No results for input(s): AMMONIA in the last 168 hours. Coagulation Profile: No results for input(s): INR, PROTIME in the last 168 hours. Cardiac Enzymes: Recent Labs  Lab 07/08/18 1152  TROPONINI 0.09*   BNP (last 3 results) No results for input(s): PROBNP in the last 8760 hours. HbA1C: No results for input(s): HGBA1C in the last 72 hours. CBG: No results for input(s): GLUCAP in the last 168 hours.  Lipid Profile: No results for input(s): CHOL, HDL, LDLCALC, TRIG, CHOLHDL, LDLDIRECT in the last 72 hours. Thyroid Function Tests: No results for input(s): TSH, T4TOTAL, FREET4, T3FREE, THYROIDAB in the last 72 hours. Anemia Panel: No results for input(s): VITAMINB12, FOLATE, FERRITIN, TIBC, IRON, RETICCTPCT in the last 72 hours.    Radiology Studies: I have reviewed all of the imaging during this hospital visit personally     Scheduled Meds: . ALPRAZolam  0.5 mg Oral QHS  . amLODipine  10 mg Oral Daily  . atorvastatin  40 mg Oral QPM  . carvedilol  12.5 mg Oral BID WC  . clopidogrel  75 mg Oral Q breakfast  . famotidine  20 mg Oral QAC supper  . furosemide  40 mg Oral BID  . hydrALAZINE  25 mg Oral Q8H  . isosorbide mononitrate  15 mg Oral Daily  . mouth rinse  15 mL Mouth Rinse BID  . mirtazapine  7.5 mg Oral QHS  . mometasone-formoterol  2 puff Inhalation BID  . nicotine  14 mg Transdermal Daily   Continuous Infusions: . sodium chloride 10 mL/hr at 07/11/18 0300  .  piperacillin-tazobactam (ZOSYN)  IV 2.25 g (07/12/18 0549)     LOS: 4 days        Mauricio Gerome Apley, MD [facility] Pager [phone]

## 2018-07-12 NOTE — Progress Notes (Signed)
I spoke with GI at [facility] last night. They plan to review images this morning and will let me know what they feel should be done.  Hopefully we'll have a plan by late morning or so.

## 2018-07-12 NOTE — Progress Notes (Signed)
Spoke with Radiology representative whom states study needs to be changed to a Bile leak study.

## 2018-07-12 NOTE — Progress Notes (Signed)
[facility] Progress Note     Subjective: CC: RUQ pain Pain worsened from yesterday, character of pain unchanged. Patient denies nausea. Having bowel function.  Objective: Vital signs in last 24 hours: Temp:  [98.1 F (36.7 C)-98.6 F (37 C)] 98.1 F (36.7 C) (11/06 0823) Pulse Rate:  [58-68] 59 (11/06 0823) Resp:  [12-18] 16 (11/06 0823) BP: (129-156)/(52-61) 156/57 (11/06 0823) SpO2:  [91 %-99 %] 96 % (11/06 0823) Last BM Date: 07/11/18  Intake/Output from previous day: 11/05 0701 - 11/06 0700 In: 1202.8 [P.O.:900; I.V.:120; IV Piggyback:182.8] Out: 1725 [Urine:1725] Intake/Output this shift: No intake/output data recorded.  PE: Gen: Alert, NAD, pleasant Card: Regular rate and rhythm Pulm: Normal effort, clear to auscultation bilaterally, on supplemental oxygen Abd: Soft,TTP in RUQ with some milder generalized TTP, non-distended, bowel sounds present, no HSM, incisionswell healed Skin: warm and dry, no rashes  Psych: A&Ox3   Lab Results:  Recent Labs    07/11/18 0811 07/12/18 0438  WBC 12.1* 9.9  HGB 7.6* 8.3*  HCT 25.3* 27.7*  PLT 270 279   BMET Recent Labs    07/11/18 0811 07/12/18 0438  NA 137 138  K 4.2 4.9  CL 97* 98  CO2 30 29  GLUCOSE 133* 120*  BUN 64* 63*  CREATININE 2.56* 2.71*  CALCIUM 8.1* 8.5*   PT/INR No results for input(s): LABPROT, INR in the last 72 hours. CMP     Component Value Date/Time   NA 138 07/12/2018 0438   NA 143 07/03/2018   K 4.9 07/12/2018 0438   CL 98 07/12/2018 0438   CO2 29 07/12/2018 0438   GLUCOSE 120 (H) 07/12/2018 0438   BUN 63 (H) 07/12/2018 0438   BUN 52 (A) 07/03/2018   CREATININE 2.71 (H) 07/12/2018 0438   CREATININE 1.32 (H) 06/17/2015 1257   CALCIUM 8.5 (L) 07/12/2018 0438   PROT 5.7 (L) 07/12/2018 0438   ALBUMIN 2.5 (L) 07/12/2018 0438   AST 23 07/12/2018 0438   ALT 9 07/12/2018 0438   ALKPHOS 43 07/12/2018 0438   BILITOT 0.9 07/12/2018 0438   GFRNONAA 17 (L) 07/12/2018  0438   GFRAA 20 (L) 07/12/2018 0438   Lipase     Component Value Date/Time   LIPASE 124 (H) 07/10/2018 0452       Studies/Results: Ct Abdomen Pelvis Wo Contrast  Result Date: 07/10/2018 CLINICAL DATA:  67 year old female with new pain and fever. Also for follow-up of abdominal collection between the duodenum and pancreatic head. EXAM: CT ABDOMEN AND PELVIS WITHOUT CONTRAST TECHNIQUE: Multidetector CT imaging of the abdomen and pelvis was performed following the standard protocol without IV contrast. COMPARISON:  07/10/2018 MR, 07/08/2018 ultrasound, 05/28/2018 CT and prior studies FINDINGS: Please note that parenchymal abnormalities may be missed without intravenous contrast. Lower chest: New airspace disease and consolidation within the LEFT LOWER lobe is compatible with pneumonia. A trace LEFT pleural effusion is noted. Cardiomegaly is identified. Hepatobiliary: The liver is unremarkable. The patient is status post cholecystectomy. No biliary dilatation. Pancreas: Unchanged. Ill-defined 4-5 cm heterogeneous structure in the region the pancreatic head/descending duodenum is unchanged measuring approximately 5 cm. No pancreatic ductal dilatation. Spleen: Unremarkable Adrenals/Urinary Tract: Punctate nonobstructing bilateral renal calculi are again identified. No acute abnormality. Foley catheter in the bladder is noted. Fullness of the LEFT adrenal gland again noted. Stomach/Bowel: No significant change. No bowel obstruction. Fullness/indistinctness along the descending duodenum again noted. Vascular/Lymphatic: Aorto bi-iliac stent graft again identified. Native infrarenal suprailiac abdominal aortic aneurysm sac measures 5  cm. Renal stent again noted. No enlarged lymph nodes identified. Reproductive: Status post hysterectomy. No adnexal masses. Other: Diffuse subcutaneous edema noted. No ascites, new collection or pneumoperitoneum. Musculoskeletal: No acute or suspicious bony abnormalities.  IMPRESSION: 1. New LEFT LOWER pneumonia. 2. Ill-defined 4-5 cm heterogeneous structure along the pancreatic head/descending duodenum which may represent postoperative changes, hematoma or biloma. Infection is considered less likely. 3. Diffuse subcutaneous edema 4. Aorto bi-iliac stent graft 5. Punctate nonobstructing bilateral renal calculi Electronically Signed   By: Margarette Canada M.D.   On: 07/10/2018 18:15    Anti-infectives: Anti-infectives (From admission, onward)   Start     Dose/Rate Route Frequency Ordered Stop   07/12/18 2000  vancomycin (VANCOCIN) IVPB 750 mg/150 ml premix     750 mg 150 mL/hr over 60 Minutes Intravenous Every 48 hours 07/10/18 1904     07/11/18 1200  piperacillin-tazobactam (ZOSYN) IVPB 2.25 g     2.25 g 100 mL/hr over 30 Minutes Intravenous Every 6 hours 07/11/18 0827     07/10/18 2000  piperacillin-tazobactam (ZOSYN) IVPB 2.25 g  Status:  Discontinued     2.25 g 100 mL/hr over 30 Minutes Intravenous Every 8 hours 07/10/18 1203 07/11/18 0827   07/10/18 1900  vancomycin (VANCOCIN) 1,250 mg in sodium chloride 0.9 % 250 mL IVPB     1,250 mg 166.7 mL/hr over 90 Minutes Intravenous STAT 07/10/18 1844 07/10/18 2112   07/10/18 1200  piperacillin-tazobactam (ZOSYN) IVPB 2.25 g     2.25 g 100 mL/hr over 30 Minutes Intravenous  Once 07/10/18 1040 07/10/18 1233       Assessment/Plan CAD, hx of MI 2012 AAA PAF Chronic diastolic CHF Peripheral artery disease HTN HLD T2DM COPD on home O2 AKI on CKD stage IV Chronic back pain Depression GERD   S/P lap chole 04/19/18 RUQ pain with hyperbilirubinemia ?Acute pancreatitis- lipase 124 11/4, trending down - [place] 11/2 - 7.2x3.xx3.2 complex fluid collection in gallbladder fossa, likely hematoma - MRCP 11/4 -retained stone in cystic stump, no choledocholithiasis, fluid in gallbladder fossa concerning for possible post-op bile leak - CT 11/4 - 4-5 cm heterogeneous structure along pancreatic head/descending duodenum,  possible hematoma vs biloma - Tbili 0.9  - discussed with GI - no ERCP at this time, and [facility] without further recommendation LLL pneumonia/Leukocytosis- WBC 9.9, afebrile overnight, LLL pneumonia seen on CT 11/4, this seems most likely source  FEN: FLD, IVF VTE: SCDs, SQ heparin, plavix - last dose 11/2 ID: zosyn 11/4>>, vanc 11/4  Will discuss with MD if there is anything else to offer patient other than abx and pain control at this time. Will discuss whether tertiary transfer for surgical intervention needed. No indications for acute surgical intervention. Further recommendations to come.   LOS: 4 days    Brigid Re , [facility] 07/12/2018, 9:01 AM Pager: [phone] Consults: [phone] Mon-Fri 7:00 am-4:30 pm Sat-Sun 7:00 am-11:30 am

## 2018-07-12 NOTE — Progress Notes (Signed)
[facility] Progress Note    Since last GI note: Still with RUQ pains  Objective: Vital signs in last 24 hours: Temp:  [98.1 F (36.7 C)-98.6 F (37 C)] 98.1 F (36.7 C) (11/06 0823) Pulse Rate:  [58-68] 59 (11/06 0823) Resp:  [12-18] 16 (11/06 0823) BP: (129-156)/(52-61) 156/57 (11/06 0823) SpO2:  [91 %-99 %] 96 % (11/06 0823) Last BM Date: 07/12/18 General: alert and oriented times 3 Heart: regular rate and rythm Abdomen: soft, mildly tender RUQ, non-distended, normal bowel sounds   Lab Results: Recent Labs    07/10/18 0452 07/11/18 0811 07/12/18 0438  WBC 16.0* 12.1* 9.9  HGB 7.4* 7.6* 8.3*  PLT 258 270 279  MCV 97.7 95.8 96.2   Recent Labs    07/10/18 0452 07/11/18 0453 07/11/18 0811 07/12/18 0438  NA 143  --  137 138  K 4.1  --  4.2 4.9  CL 103  --  97* 98  CO2 29  --  30 29  GLUCOSE 125*  --  133* 120*  BUN 69*  --  64* 63*  CREATININE 2.68* 2.67* 2.56* 2.71*  CALCIUM 8.2*  --  8.1* 8.5*   Recent Labs    07/10/18 0452 07/11/18 0811 07/12/18 0438  PROT 4.9* 5.4* 5.7*  ALBUMIN 2.2* 2.5* 2.5*  AST 23 23 23   ALT 9 10 9   ALKPHOS 43 46 43  BILITOT 1.2 0.7 0.9   No results for input(s): INR in the last 72 hours.   Studies/Results: Ct Abdomen Pelvis Wo Contrast  Result Date: 07/10/2018 CLINICAL DATA:  67 year old female with new pain and fever. Also for follow-up of abdominal collection between the duodenum and pancreatic head. EXAM: CT ABDOMEN AND PELVIS WITHOUT CONTRAST TECHNIQUE: Multidetector CT imaging of the abdomen and pelvis was performed following the standard protocol without IV contrast. COMPARISON:  07/10/2018 MR, 07/08/2018 ultrasound, 05/28/2018 CT and prior studies FINDINGS: Please note that parenchymal abnormalities may be missed without intravenous contrast. Lower chest: New airspace disease and consolidation within the LEFT LOWER lobe is compatible with pneumonia. A trace LEFT pleural effusion is noted. Cardiomegaly is  identified. Hepatobiliary: The liver is unremarkable. The patient is status post cholecystectomy. No biliary dilatation. Pancreas: Unchanged. Ill-defined 4-5 cm heterogeneous structure in the region the pancreatic head/descending duodenum is unchanged measuring approximately 5 cm. No pancreatic ductal dilatation. Spleen: Unremarkable Adrenals/Urinary Tract: Punctate nonobstructing bilateral renal calculi are again identified. No acute abnormality. Foley catheter in the bladder is noted. Fullness of the LEFT adrenal gland again noted. Stomach/Bowel: No significant change. No bowel obstruction. Fullness/indistinctness along the descending duodenum again noted. Vascular/Lymphatic: Aorto bi-iliac stent graft again identified. Native infrarenal suprailiac abdominal aortic aneurysm sac measures 5 cm. Renal stent again noted. No enlarged lymph nodes identified. Reproductive: Status post hysterectomy. No adnexal masses. Other: Diffuse subcutaneous edema noted. No ascites, new collection or pneumoperitoneum. Musculoskeletal: No acute or suspicious bony abnormalities. IMPRESSION: 1. New LEFT LOWER pneumonia. 2. Ill-defined 4-5 cm heterogeneous structure along the pancreatic head/descending duodenum which may represent postoperative changes, hematoma or biloma. Infection is considered less likely. 3. Diffuse subcutaneous edema 4. Aorto bi-iliac stent graft 5. Punctate nonobstructing bilateral renal calculi Electronically Signed   By: Margarette Canada M.D.   On: 07/10/2018 18:15     Medications: Scheduled Meds: . ALPRAZolam  0.5 mg Oral QHS  . amLODipine  10 mg Oral Daily  . atorvastatin  40 mg Oral QPM  . carvedilol  12.5 mg Oral BID WC  . clopidogrel  75 mg Oral Q breakfast  . famotidine  20 mg Oral QAC supper  . furosemide  40 mg Oral BID  . hydrALAZINE  25 mg Oral Q8H  . isosorbide mononitrate  15 mg Oral Daily  . mouth rinse  15 mL Mouth Rinse BID  . mirtazapine  7.5 mg Oral QHS  . mometasone-formoterol  2 puff  Inhalation BID  . nicotine  14 mg Transdermal Daily  . potassium chloride  20 mEq Oral BID   Continuous Infusions: . sodium chloride 10 mL/hr at 07/11/18 0300  . piperacillin-tazobactam (ZOSYN)  IV 2.25 g (07/12/18 0549)  . vancomycin     PRN Meds:.sodium chloride, acetaminophen, albuterol, bisacodyl, diphenhydrAMINE, HYDROmorphone (DILAUDID) injection, loperamide, ondansetron **OR** ondansetron (ZOFRAN) IV, oxyCODONE    Assessment/Plan: 67 y.o. female with complex pancreaticobiliary issues  First, I spoke with gastroenterology at [facility] and they were able to review her recent MRI, CT images.  They do not think it is possible to endoscopically remove the cystic duct stone.  I am not sure if that stone is causing any of her issues, but it certainly may be.  General surgery can comment further.  Second, she has a complex fluid collection in the region of her pancreas, duodenum, postoperative site.  This has been felt to be a pseudocyst and it certainly may be just that.  If it is indeed a pseudocyst it is not mature enough for drainage at this point.  There is a possibility that it is actually a bit smaller on serial CT images.  A question has been raised about possible low-grade bile leak accounting for some of the fluid collection.  It seems unlikely but I discussed this with the family and I think a HIDA scan is reasonable because if there is a leak proven then that could certainly be driving some of her issues and it would be treatable by ERCP here with biliary sphincterotomy or biliary stent placement.  I will order a HIDA scan today.  If the HIDA shows no evidence of leak as I suspect will be the case then I think focus needs to be on optimizing her nutritional status, mobilizing her in her deconditioned state, controlling her pain all in essence to buy time to allow the complex fluid collection to either go away or mature enough to allow for safe drainage.  I discussed all this with her and  her daughter in the room today.  They are frustrated but do grasp the complexity of her situation.    Milus Banister, MD  07/12/2018, 10:38 AM  Gastroenterology Pager [phone]

## 2018-07-12 NOTE — Care Management Note (Signed)
Case Management Note  Patient Details  Name: [name] MRN: [id number] Date of Birth: Aug 11, 1951  Subjective/Objective:                    Action/Plan:  Pt is from [facility]   Expected Discharge Date:                  Expected Discharge Plan:  [place]  In-House Referral:  Clinical Social Work  Discharge planning Services  CM Consult  Post Acute Care Choice:    Choice offered to:     DME Arranged:    DME Agency:     HH Arranged:    [place] Agency:     Status of Service:  Completed, signed off  If discussed at [employer], dates discussed:    Additional CommentsPurcell Mouton, RN 07/12/2018, 12:57 PM

## 2018-07-12 NOTE — Progress Notes (Signed)
Pt return from HIDA scan, c/o of pain right epigastric of abdomen 7/10. SRP, RN

## 2018-07-13 DIAGNOSIS — T888XXA Other specified complications of surgical and medical care, not elsewhere classified, initial encounter: Secondary | ICD-10-CM

## 2018-07-13 DIAGNOSIS — E440 Moderate protein-calorie malnutrition: Secondary | ICD-10-CM

## 2018-07-13 LAB — BASIC METABOLIC PANEL
Anion gap: 13 (ref 5–15)
BUN: 59 mg/dL — ABNORMAL HIGH (ref 8–23)
CALCIUM: 8.8 mg/dL — AB (ref 8.9–10.3)
CHLORIDE: 98 mmol/L (ref 98–111)
CO2: 30 mmol/L (ref 22–32)
CREATININE: 2.74 mg/dL — AB (ref 0.44–1.00)
GFR calc non Af Amer: 17 mL/min — ABNORMAL LOW (ref >60)
GFR, EST AFRICAN AMERICAN: 20 mL/min — AB (ref >60)
Glucose, Bld: 133 mg/dL — ABNORMAL HIGH (ref 70–99)
Potassium: 4.4 mmol/L (ref 3.5–5.1)
SODIUM: 141 mmol/L (ref 135–145)

## 2018-07-13 LAB — CBC WITH DIFFERENTIAL/PLATELET
Abs Immature Granulocytes: 0.09 10*3/uL — ABNORMAL HIGH (ref 0.00–0.07)
BASOS ABS: 0.1 10*3/uL (ref 0.0–0.1)
Basophils Relative: 1 %
EOS PCT: 5 %
Eosinophils Absolute: 0.5 10*3/uL (ref 0.0–0.5)
HCT: 27.2 % — ABNORMAL LOW (ref 36.0–46.0)
HEMOGLOBIN: 8.1 g/dL — AB (ref 12.0–15.0)
Immature Granulocytes: 1 %
LYMPHS PCT: 9 %
Lymphs Abs: 0.8 10*3/uL (ref 0.7–4.0)
MCH: 28.8 pg (ref 26.0–34.0)
MCHC: 29.8 g/dL — AB (ref 30.0–36.0)
MCV: 96.8 fL (ref 80.0–100.0)
Monocytes Absolute: 0.9 10*3/uL (ref 0.1–1.0)
Monocytes Relative: 9 %
NEUTROS PCT: 75 %
NRBC: 0 % (ref 0.0–0.2)
Neutro Abs: 6.9 10*3/uL (ref 1.7–7.7)
Platelets: 287 10*3/uL (ref 150–400)
RBC: 2.81 MIL/uL — AB (ref 3.87–5.11)
RDW: 17.4 % — AB (ref 11.5–15.5)
WBC: 9.2 10*3/uL (ref 4.0–10.5)

## 2018-07-13 MED ORDER — OXYCODONE HCL 5 MG PO TABS: 5.0000 mg | ORAL_TABLET | Freq: Four times a day (QID) | ORAL | Status: DC | PRN

## 2018-07-13 MED ORDER — HYDROMORPHONE HCL 1 MG/ML IJ SOLN
0.5000 mg | INTRAMUSCULAR | Status: DC | PRN
  Administered 2018-07-13: 0.5 mg via INTRAVENOUS
  Filled 2018-07-13: qty 0.5

## 2018-07-13 MED ORDER — BOOST / RESOURCE BREEZE PO LIQD CUSTOM
1.0000 | Freq: Three times a day (TID) | ORAL | Status: DC
  Administered 2018-07-13 – 2018-07-19 (×17): 1 via ORAL

## 2018-07-13 MED ORDER — PRO-STAT SUGAR FREE PO LIQD
30.0000 mL | Freq: Three times a day (TID) | ORAL | Status: DC
  Administered 2018-07-13 – 2018-07-19 (×18): 30 mL via ORAL
  Filled 2018-07-13 (×18): qty 30

## 2018-07-13 MED ORDER — PRO-STAT SUGAR FREE PO LIQD
30.0000 mL | Freq: Two times a day (BID) | ORAL | Status: DC
  Filled 2018-07-13: qty 30

## 2018-07-13 MED ORDER — ACETAMINOPHEN 325 MG PO TABS
650.0000 mg | ORAL_TABLET | Freq: Four times a day (QID) | ORAL | Status: DC
  Administered 2018-07-13 – 2018-07-19 (×21): 650 mg via ORAL
  Filled 2018-07-13 (×21): qty 2

## 2018-07-13 MED ORDER — OXYCODONE HCL 5 MG PO TABS
5.0000 mg | ORAL_TABLET | ORAL | Status: DC | PRN
  Administered 2018-07-13: 10 mg via ORAL
  Administered 2018-07-13: 5 mg via ORAL
  Administered 2018-07-14: 10 mg via ORAL
  Filled 2018-07-13 (×2): qty 2
  Filled 2018-07-13: qty 1

## 2018-07-13 MED ORDER — GABAPENTIN 300 MG PO CAPS: 300.0000 mg | ORAL_CAPSULE | Freq: Three times a day (TID) | ORAL | Status: DC

## 2018-07-13 MED ORDER — HYDROMORPHONE HCL 1 MG/ML IJ SOLN
1.0000 mg | INTRAMUSCULAR | Status: DC | PRN
  Administered 2018-07-13 – 2018-07-15 (×9): 1 mg via INTRAVENOUS
  Filled 2018-07-13 (×9): qty 1

## 2018-07-13 MED ORDER — GABAPENTIN 100 MG PO CAPS
200.0000 mg | ORAL_CAPSULE | Freq: Three times a day (TID) | ORAL | Status: DC
  Administered 2018-07-13 – 2018-07-19 (×19): 200 mg via ORAL
  Filled 2018-07-13 (×19): qty 2

## 2018-07-13 NOTE — Progress Notes (Signed)
RN received a call from [employer]. Patient with a 8 beat run of VT. No complaints of CP, VSS, and EKG obtained.

## 2018-07-13 NOTE — Progress Notes (Signed)
PROGRESS NOTE    [name]  UJW:[id number] DOB: [birth year] DOA: 07/08/2018 PCP: Bernerd Limbo, MD    Brief Narrative:  67 year old female who presented with right upper quadrant abdominal pain.  She does have significant past medical history for gallstone pancreatitis status post cholecystectomy, chronic kidney disease, COPD, coronary disease, type 2 diabetes mellitus and hypertension.  She developed sharp right upper quadrant abdominal pain, associated with nausea.  On her initial physical examination blood pressure 160/50, her lungs are clear to auscultation bilaterally, heart S1-S2 present, irregular rhythm, abdomen tender to in the right upper quadrant, no lower extremity edema.   Patient was admitted to the hospital with working diagnosis of recurrent pancreatitis   Assessment & Plan:   Active Problems:   RUQ pain   Intraabdominal fluid collection   Cystic duct calculus   Fluid collection at surgical site   Stage II pressure ulcer ([place])   1. Acute pancreatitis complicated with pancreatic fluid collection.No bilary leak no HIDA scan, positive cystic duct dilatation with positive stone. Not candidate for endoscopic procedure, and no indication for surgery. Persistent sever abdominal pain, will continue with as needed IV hydromorphone to 1 mg IV as needed q 3 h and oxycodone, scheduled acetaminophen and gabapentin. Discontinue IV antibiotic therapy.   2. AKI on stage IV CKD. Stable renal function with serum cr at 2,74, K at 4,4 and serum bicarbonate at 30. Will continue to encourage po intake, no nausea or vomiting.   3. Chronic diastolic heart failure with acute exacerbation. Heart failure management with carvedilol, hydralazine and isosorbide. Continue oral furosemide to keep negative fluid balance.   4. Abdominal aortic aneurysm. SP repair and stent to the common iliac artery. Continue clopidogrel with good toleration.   5. Stage 2 sacrum pressure ulcer (present on  admission). With local wound care, frequent turning. Out of bed as tolerated.   6. HTN. On carvedilol, amlodipine, hydralazine and isosorbide.   7. Moderate calorie protein malnutrition.  Nutritional supplements.    DVT prophylaxis: enoxaparin   Code Status: full Family Communication: No family at the bedside.  Disposition Plan/ discharge barriers: pending placement  Body mass index is 22.45 kg/m. Malnutrition Type:      Malnutrition Characteristics:      Nutrition Interventions:     RN Pressure Injury Documentation: Pressure Injury 06/15/18 Stage I -  Intact skin with non-blanchable redness of a localized area usually over a bony prominence. moisture associated skin damage related to frequent diarrhea (Active)  06/15/18 1115   Location: Sacrum  Location Orientation: Posterior  Staging: Stage I -  Intact skin with non-blanchable redness of a localized area usually over a bony prominence.  Wound Description (Comments): moisture associated skin damage related to frequent diarrhea  Present on Admission:      Pressure Injury Stage II -  Partial thickness loss of dermis presenting as a shallow open ulcer with a red, pink wound bed without slough. (Active)      Location:   Location Orientation:   Staging: Stage II -  Partial thickness loss of dermis presenting as a shallow open ulcer with a red, pink wound bed without slough.  Wound Description (Comments):   Present on Admission:      Pressure Injury 07/08/18 Stage II -  Partial thickness loss of dermis presenting as a shallow open ulcer with a red, pink wound bed without slough. (Active)  07/08/18 1730   Location: Sacrum  Location Orientation: Posterior  Staging: Stage II -  Partial  thickness loss of dermis presenting as a shallow open ulcer with a red, pink wound bed without slough.  Wound Description (Comments):   Present on Admission: Yes     Consultants:   GI   Surgery   Procedures:      Antimicrobials:       Subjective: Patient continue to have severe abdominal pain, sharp in nature, 10/10 intensity, no radiation, worse with deep inspiration and movement, improved mildly with analgesics IV, no nausea or vomiting.   Objective: Vitals:   07/12/18 2153 07/13/18 0402 07/13/18 0700 07/13/18 1006  BP: (!) 127/47 (!) 143/49 (!) 144/56 (!) 145/48  Pulse: (!) 57 (!) 58 61 61  Resp: 18 18 17 16   Temp: 98.3 F (36.8 C) 98.3 F (36.8 C)    TempSrc: Oral Oral    SpO2: 92% 93% 98%   Weight:      Height:        Intake/Output Summary (Last 24 hours) at 07/13/2018 1054 Last data filed at 07/12/2018 1700 Gross per 24 hour  Intake -  Output 700 ml  Net -700 ml   Filed Weights   07/08/18 1134  Weight: 61.2 kg    Examination:   General: deconditioned and in pain.  Neurology: Awake and alert, non focal  E ENT: mild pallor, no icterus, oral mucosa moist Cardiovascular: No JVD. S1-S2 present, rhythmic, no gallops, rubs, or murmurs. No lower extremity edema. Pulmonary: positive breath sounds bilaterally, adequate air movement, no wheezing, rhonchi or rales. Gastrointestinal. Abdomen mild distended, tender to palpation, superficial, no organomegaly.  Skin. No rashes Musculoskeletal: no joint deformities     Data Reviewed: I have personally reviewed following labs and imaging studies  CBC: Recent Labs  Lab 07/09/18 0510 07/10/18 0452 07/11/18 0811 07/12/18 0438 07/13/18 0419  WBC 8.9 16.0* 12.1* 9.9 9.2  NEUTROABS  --   --   --   --  6.9  HGB 8.9* 7.4* 7.6* 8.3* 8.1*  HCT 30.3* 25.2* 25.3* 27.7* 27.2*  MCV 97.7 97.7 95.8 96.2 96.8  PLT 377 258 270 279 132   Basic Metabolic Panel: Recent Labs  Lab 07/09/18 0510 07/10/18 0452 07/11/18 0453 07/11/18 0811 07/12/18 0438 07/13/18 0419  NA 144 143  --  137 138 141  K 3.8 4.1  --  4.2 4.9 4.4  CL 101 103  --  97* 98 98  CO2 32 29  --  30 29 30   GLUCOSE 132* 125*  --  133* 120* 133*  BUN 66* 69*  --   64* 63* 59*  CREATININE 2.59* 2.68* 2.67* 2.56* 2.71* 2.74*  CALCIUM 8.9 8.2*  --  8.1* 8.5* 8.8*   GFR: Estimated Creatinine Clearance: 18.4 mL/min (A) (by C-G formula based on SCr of 2.74 mg/dL (H)). Liver Function Tests: Recent Labs  Lab 07/08/18 1152 07/09/18 0510 07/10/18 0452 07/11/18 0811 07/12/18 0438  AST 22 23 23 23 23   ALT 9 8 9 10 9   ALKPHOS 71 62 43 46 43  BILITOT 1.3* 0.8 1.2 0.7 0.9  PROT 6.7 5.6* 4.9* 5.4* 5.7*  ALBUMIN 3.2* 2.7* 2.2* 2.5* 2.5*   Recent Labs  Lab 07/08/18 1152 07/10/18 0452  LIPASE 305* 124*   No results for input(s): AMMONIA in the last 168 hours. Coagulation Profile: No results for input(s): INR, PROTIME in the last 168 hours. Cardiac Enzymes: Recent Labs  Lab 07/08/18 1152  TROPONINI 0.09*   BNP (last 3 results) No results for input(s): PROBNP in the last 8760  hours. HbA1C: No results for input(s): HGBA1C in the last 72 hours. CBG: No results for input(s): GLUCAP in the last 168 hours. Lipid Profile: No results for input(s): CHOL, HDL, LDLCALC, TRIG, CHOLHDL, LDLDIRECT in the last 72 hours. Thyroid Function Tests: No results for input(s): TSH, T4TOTAL, FREET4, T3FREE, THYROIDAB in the last 72 hours. Anemia Panel: No results for input(s): VITAMINB12, FOLATE, FERRITIN, TIBC, IRON, RETICCTPCT in the last 72 hours.    Radiology Studies: I have reviewed all of the imaging during this hospital visit personally     Scheduled Meds: . acetaminophen  650 mg Oral Q6H  . ALPRAZolam  0.5 mg Oral QHS  . amLODipine  10 mg Oral Daily  . atorvastatin  40 mg Oral QPM  . carvedilol  12.5 mg Oral BID WC  . clopidogrel  75 mg Oral Q breakfast  . famotidine  20 mg Oral QAC supper  . feeding supplement  1 Container Oral TID BM  . feeding supplement (PRO-STAT SUGAR FREE 64)  30 mL Oral TID  . furosemide  40 mg Oral BID  . gabapentin  200 mg Oral TID  . hydrALAZINE  25 mg Oral Q8H  . isosorbide mononitrate  15 mg Oral Daily  . mouth  rinse  15 mL Mouth Rinse BID  . mirtazapine  7.5 mg Oral QHS  . mometasone-formoterol  2 puff Inhalation BID  . nicotine  14 mg Transdermal Daily   Continuous Infusions: . sodium chloride 10 mL/hr at 07/11/18 0300  . piperacillin-tazobactam (ZOSYN)  IV 2.25 g (07/13/18 0536)     LOS: 5 days        Cariah Salatino Gerome Apley, MD [facility] Pager [phone]

## 2018-07-13 NOTE — Final Consult Note (Signed)
[facility] Progress Note     Subjective: CC: RUQ pain Patient with continued pain, unchanged. Tolerating liquids, wants to advance diet. Asked about PO dilaudid as a possibility for discharge. Wants to go back to [facility] rehab at discharge.   Objective: Vital signs in last 24 hours: Temp:  [98.1 F (36.7 C)-98.6 F (37 C)] 98.3 F (36.8 C) (11/07 0402) Pulse Rate:  [57-68] 61 (11/07 0700) Resp:  [16-18] 17 (11/07 0700) BP: (127-156)/(47-57) 144/56 (11/07 0700) SpO2:  [92 %-98 %] 98 % (11/07 0700) Last BM Date: 07/22/18  Intake/Output from previous day: 11/06 0701 - 11/07 0700 In: -  Out: 1300 [Urine:1300] Intake/Output this shift: No intake/output data recorded.  PE: Gen: Alert, NAD, pleasant Card: Regular rate and rhythm Pulm: Normal effort, clear to auscultation bilaterally, on supplemental oxygen Abd: Soft,TTP in RUQ with some milder generalized TTP, non-distended, bowel sounds present, no HSM, incisionswell healed Skin: warm and dry, no rashes  Psych: A&Ox3  Lab Results:  Recent Labs    07/12/18 0438 07/13/18 0419  WBC 9.9 9.2  HGB 8.3* 8.1*  HCT 27.7* 27.2*  PLT 279 287   BMET Recent Labs    07/12/18 0438 07/13/18 0419  NA 138 141  K 4.9 4.4  CL 98 98  CO2 29 30  GLUCOSE 120* 133*  BUN 63* 59*  CREATININE 2.71* 2.74*  CALCIUM 8.5* 8.8*   PT/INR No results for input(s): LABPROT, INR in the last 72 hours. CMP     Component Value Date/Time   NA 141 07/13/2018 0419   NA 143 07/03/2018   K 4.4 07/13/2018 0419   CL 98 07/13/2018 0419   CO2 30 07/13/2018 0419   GLUCOSE 133 (H) 07/13/2018 0419   BUN 59 (H) 07/13/2018 0419   BUN 52 (A) 07/03/2018   CREATININE 2.74 (H) 07/13/2018 0419   CREATININE 1.32 (H) 06/17/2015 1257   CALCIUM 8.8 (L) 07/13/2018 0419   PROT 5.7 (L) 07/12/2018 0438   ALBUMIN 2.5 (L) 07/12/2018 0438   AST 23 07/12/2018 0438   ALT 9 07/12/2018 0438   ALKPHOS 43 07/12/2018 0438   BILITOT 0.9  07/12/2018 0438   GFRNONAA 17 (L) 07/13/2018 0419   GFRAA 20 (L) 07/13/2018 0419   Lipase     Component Value Date/Time   LIPASE 124 (H) 07/10/2018 0452       Studies/Results: Nm Hepato Biliary Leak  Result Date: 07/12/2018 CLINICAL DATA:  Patient status post cholecystectomy. Persistent abdominal pain. Cystic duct remnant noted on comparison MRI. EXAM: NUCLEAR MEDICINE HEPATOBILIARY IMAGING TECHNIQUE: Sequential images of the abdomen were obtained out to 120 minutes following intravenous administration of radiopharmaceutical. RADIOPHARMACEUTICALS:  5.5 mCi Tc-22m  Choletec IV COMPARISON:  MRI 06/09/2018, CT 06/09/2018 FINDINGS: Prompt clearance of radiotracer from the blood pool and homogeneous accumulation within liver parenchyma. Counts are promptly excreted into the extrahepatic ducts and common bile ducts. The duodenum is evident by 20 minutes. Small bowel evident by 40 minutes. The cystic duct remnant begins to fill by 40 minutes. This duct continues to fill over the course the study (120 minutes). No evidence of extraluminal radiotracer to suggest bile leak. IMPRESSION: 1. No evidence of bile leak or obstruction. 2. Patent common bile duct. 3. Prominent cystic duct remnant as seen on comparison MRI. Electronically Signed   By: Suzy Bouchard M.D.   On: 07/12/2018 14:46    Anti-infectives: Anti-infectives (From admission, onward)   Start     Dose/Rate Route Frequency Ordered Stop  07/12/18 2000  vancomycin (VANCOCIN) IVPB 750 mg/150 ml premix  Status:  Discontinued     750 mg 150 mL/hr over 60 Minutes Intravenous Every 48 hours 07/10/18 1904 07/12/18 1143   07/11/18 1200  piperacillin-tazobactam (ZOSYN) IVPB 2.25 g     2.25 g 100 mL/hr over 30 Minutes Intravenous Every 6 hours 07/11/18 0827     07/10/18 2000  piperacillin-tazobactam (ZOSYN) IVPB 2.25 g  Status:  Discontinued     2.25 g 100 mL/hr over 30 Minutes Intravenous Every 8 hours 07/10/18 1203 07/11/18 0827   07/10/18  1900  vancomycin (VANCOCIN) 1,250 mg in sodium chloride 0.9 % 250 mL IVPB     1,250 mg 166.7 mL/hr over 90 Minutes Intravenous STAT 07/10/18 1844 07/10/18 2112   07/10/18 1200  piperacillin-tazobactam (ZOSYN) IVPB 2.25 g     2.25 g 100 mL/hr over 30 Minutes Intravenous  Once 07/10/18 1040 07/10/18 1233       Assessment/Plan CAD, hx of MI 2012 AAA PAF Chronic diastolic CHF Peripheral artery disease HTN HLD T2DM COPD on home O2 AKI on CKD stage IV Chronic back pain Depression GERD   S/P lap chole 04/19/18 RUQ pain with hyperbilirubinemia - Tbili was trending down ?Acute pancreatitis- lipase124 11/4, trending down -US11/2 - 7.2x3.xx3.2 complex fluid collection in gallbladder fossa, likely hematoma - MRCP 11/4 -retained stone in cystic stump, no choledocholithiasis, fluid in gallbladder fossa concerning for possible post-op bile leak - CT 11/4 - 4-5 cm heterogeneous structure along pancreatic head/descending duodenum, possible hematoma vs biloma -discussed with GI - no ERCP at this time, and [facility] without further recommendation - HIDA negative for bile leak, collection likely hematoma vs pseudocyst - unlikely that pain is related to cystic duct stone because it is not obstructing anything, pain could be from collection which may just need time to resolve if it is a hematoma LLL pneumonia/Leukocytosis- WBC9.2,afebrile overnight,LLL pneumonia seen on CT 11/4, this seems most likely source  [place] diet VTE: SCDs, plavix  No indications for surgical intervention at this time. Will arrange f/u in our office and patient has a GI f/u appointment already. Advance diet. PT/OT consults. We will sign off at this time.  Recommend scheduled tylenol and gabapentin, prn oxy IR for pain control. If this not effective could consider adding scheduled robaxin. If this still inadequate after maximizing all other modalities could consider PO dilaudid instead of oxycodone but this  would be my last choice given abuse potential.   LOS: 5 days    Brigid Re , [facility] 07/13/2018, 8:14 AM Pager: [phone] Consults: [phone] Mon-Fri 7:00 am-4:30 pm Sat-Sun 7:00 am-11:30 am

## 2018-07-13 NOTE — Progress Notes (Signed)
[facility] Progress Note    Since last GI note: HIDA scan yesterday showed patent CBD, no evidence of biliary leak.  SHe has persistent RUQ, epigastric pain.  The pain is not worse with eating full liquids. She is interested in advancing her diet.   Objective: Vital signs in last 24 hours: Temp:  [98.1 F (36.7 C)-98.6 F (37 C)] 98.3 F (36.8 C) (11/07 0402) Pulse Rate:  [57-68] 58 (11/07 0402) Resp:  [16-18] 18 (11/07 0402) BP: (127-156)/(47-57) 143/49 (11/07 0402) SpO2:  [92 %-98 %] 93 % (11/07 0402) Last BM Date: 07/22/18 General: alert and oriented times 3 Heart: regular rate and rythm Abdomen: soft, mildly tender RUQ, non-distended, normal bowel sounds   Lab Results: Recent Labs    07/11/18 0811 07/12/18 0438 07/13/18 0419  WBC 12.1* 9.9 9.2  HGB 7.6* 8.3* 8.1*  PLT 270 279 287  MCV 95.8 96.2 96.8   Recent Labs    07/11/18 0811 07/12/18 0438 07/13/18 0419  NA 137 138 141  K 4.2 4.9 4.4  CL 97* 98 98  CO2 30 29 30   GLUCOSE 133* 120* 133*  BUN 64* 63* 59*  CREATININE 2.56* 2.71* 2.74*  CALCIUM 8.1* 8.5* 8.8*   Recent Labs    07/11/18 0811 07/12/18 0438  PROT 5.4* 5.7*  ALBUMIN 2.5* 2.5*  AST 23 23  ALT 10 9  ALKPHOS 46 43  BILITOT 0.7 0.9   No results for input(s): INR in the last 72 hours.   Studies/Results: Nm Hepato Biliary Leak  Result Date: 07/12/2018 CLINICAL DATA:  Patient status post cholecystectomy. Persistent abdominal pain. Cystic duct remnant noted on comparison MRI. EXAM: NUCLEAR MEDICINE HEPATOBILIARY IMAGING TECHNIQUE: Sequential images of the abdomen were obtained out to 120 minutes following intravenous administration of radiopharmaceutical. RADIOPHARMACEUTICALS:  5.5 mCi Tc-49m  Choletec IV COMPARISON:  MRI 06/09/2018, CT 06/09/2018 FINDINGS: Prompt clearance of radiotracer from the blood pool and homogeneous accumulation within liver parenchyma. Counts are promptly excreted into the extrahepatic ducts and common  bile ducts. The duodenum is evident by 20 minutes. Small bowel evident by 40 minutes. The cystic duct remnant begins to fill by 40 minutes. This duct continues to fill over the course the study (120 minutes). No evidence of extraluminal radiotracer to suggest bile leak. IMPRESSION: 1. No evidence of bile leak or obstruction. 2. Patent common bile duct. 3. Prominent cystic duct remnant as seen on comparison MRI. Electronically Signed   By: Suzy Bouchard M.D.   On: 07/12/2018 14:46     Medications: Scheduled Meds: . ALPRAZolam  0.5 mg Oral QHS  . amLODipine  10 mg Oral Daily  . atorvastatin  40 mg Oral QPM  . carvedilol  12.5 mg Oral BID WC  . clopidogrel  75 mg Oral Q breakfast  . famotidine  20 mg Oral QAC supper  . furosemide  40 mg Oral BID  . hydrALAZINE  25 mg Oral Q8H  . isosorbide mononitrate  15 mg Oral Daily  . mouth rinse  15 mL Mouth Rinse BID  . mirtazapine  7.5 mg Oral QHS  . mometasone-formoterol  2 puff Inhalation BID  . nicotine  14 mg Transdermal Daily   Continuous Infusions: . sodium chloride 10 mL/hr at 07/11/18 0300  . piperacillin-tazobactam (ZOSYN)  IV 2.25 g (07/13/18 0536)   PRN Meds:.sodium chloride, acetaminophen, albuterol, bisacodyl, diphenhydrAMINE, HYDROmorphone (DILAUDID) injection, loperamide, ondansetron **OR** ondansetron (ZOFRAN) IV, oxyCODONE    Assessment/Plan: 67 y.o. female with complicated pancreatio-biliary issues.  Cystic  duct remnant is dilated and contains a gallstone. This is not amenable to endoscopic therapy (per [facility] review of her recent MRI/CT images).  It is not clear if the retained stone is causing any of her issues. General surgery to decide on it's management.  Complex fluid collection is of unclear etiology; has been alternatively felt to be hematoma, pseudocyst, phlegmon, biloma.  HIDA scan yesterday shows no evidence of biliary leak. The fluid collection MAY be getting smaller based on serial imaging.  Not sure if there  are surgical options for the complex fluid collection?  IR cannot access it safely however.  She has an appointment at [place] with Dr.Mansouraty on 11/18 and should plan to keep that appointment. If the complex fluid collection 'matures' enough it MAY be amenable to endoscopic drainage.    From my perspective, unless there are realistic surgical options for cystic duct stone, complex fluid collection the focus should be on optimizing her nutrition, increasing her mobility (she is very deconditioned), pain control.  I will order lowfat diet.  Pneumonia care per hospitalist.  Please call or page with any further questions or concerns.   Milus Banister, MD  07/13/2018, 7:13 AM [facility] Pager [phone]

## 2018-07-13 NOTE — Evaluation (Signed)
Physical Therapy Evaluation Patient Details Name: [name] MRN: [id number] DOB: 02-14-1951 Today's Date: 07/13/2018   History of Present Illness  Pt is a 67 year old female admitted with RUQ pain.  PMH:  gallstone pancreatitis, s/p cholecystectomy, CKD, COPD, CAD, DM and HTN  Clinical Impression  Pt admitted with above diagnosis. Pt currently with functional limitations due to the deficits listed below (see PT Problem List).  Pt will benefit from skilled PT to increase their independence and safety with mobility to allow discharge to the venue listed below.   Pt ambulated in hallway and had loose BM so assisted to [facility].  Pt unaware of BM.  Pt reports plan to d/c back to SNF since she lives with her daughter however daughter works during the day.  Pt's SPO2 was 95-98% on room air throughout session so left pt off oxygen nasal cannula and RN aware.       Follow Up Recommendations SNF    Equipment Recommendations  None recommended by PT    Recommendations for Other Services       Precautions / Restrictions Precautions Precautions: Fall Restrictions Weight Bearing Restrictions: No      Mobility  Bed Mobility Overal bed mobility: Needs Assistance Bed Mobility: Supine to Sit;Sit to Supine     Supine to sit: Min assist Sit to supine: Min guard   General bed mobility comments: assist for trunk upright  Transfers Overall transfer level: Needs assistance Equipment used: Rolling walker (2 wheeled) Transfers: Sit to/from Stand Sit to Stand: Min guard Stand pivot transfers: Min guard       General transfer comment: verbal cues for hand placement  Ambulation/Gait Ambulation/Gait assistance: Min guard Gait Distance (Feet): 120 Feet Assistive device: Rolling walker (2 wheeled) Gait Pattern/deviations: Step-through pattern;Decreased stride length;Trunk flexed     General Gait Details: verbal cues for RW positioning and posture, SpO2 97% on room air  Stairs             Wheelchair Mobility    Modified Rankin (Stroke Patients Only)       Balance     Sitting balance-Leahy Scale: Fair                                       Pertinent Vitals/Pain Pain Assessment: 0-10 Pain Score: 8  Pain Location: RUQ Pain Descriptors / Indicators: Sore Pain Intervention(s): Repositioned;Limited activity within patient's tolerance;Monitored during session;Patient requesting pain meds-RN notified    Home Living Family/patient expects to be discharged to:: Skilled nursing facility Living Arrangements: Children 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)         Home Layout: One level Home Equipment: Environmental consultant - 4 wheels;Bedside commode;Shower seat - built in      Prior Function Level of Independence: Needs assistance   Gait / Transfers Assistance Needed: pt reports she has been at SNF for rehab for about 2 weeks and plans to return upon d/c  ADL's / Homemaking Assistance Needed: sometimes needs adl assistance        Hand Dominance        Extremity/Trunk Assessment   Upper Extremity Assessment Upper Extremity Assessment: Generalized weakness    Lower Extremity Assessment Lower Extremity Assessment: Generalized weakness       Communication   Communication: No difficulties  Cognition Arousal/Alertness: Awake/alert Behavior During Therapy: WFL for tasks assessed/performed Overall Cognitive Status: Within Functional Limits for tasks  assessed                                        General Comments      Exercises     Assessment/Plan    PT Assessment Patient needs continued PT services  PT Problem List Decreased strength;Decreased mobility;Decreased coordination;Decreased activity tolerance;Decreased balance;Decreased knowledge of use of DME       PT Treatment Interventions DME instruction;Therapeutic activities;Gait training;Therapeutic exercise;Patient/family education;Balance  training;Functional mobility training;Neuromuscular re-education    PT Goals (Current goals can be found in the Care Plan section)  Acute Rehab PT Goals Patient Stated Goal: go to rehab to regain strength PT Goal Formulation: With patient Time For Goal Achievement: 07/27/18 Potential to Achieve Goals: Good    Frequency Min 2X/week   Barriers to discharge        Co-evaluation               AM-PAC PT "6 Clicks" Daily Activity  Outcome Measure Difficulty turning over in bed (including adjusting bedclothes, sheets and blankets)?: A Lot Difficulty moving from lying on back to sitting on the side of the bed? : Unable Difficulty sitting down on and standing up from a chair with arms (e.g., wheelchair, bedside commode, etc,.)?: Unable Help needed moving to and from a bed to chair (including a wheelchair)?: A Little Help needed walking in hospital room?: A Little Help needed climbing 3-5 steps with a railing? : A Lot 6 Click Score: 12    End of Session Equipment Utilized During Treatment: Gait belt Activity Tolerance: Patient tolerated treatment well Patient left: in bed;with bed alarm set;with call bell/phone within reach Nurse Communication: Mobility status PT Visit Diagnosis: Muscle weakness (generalized) (M62.81)    Time: 8676-1950 PT Time Calculation (min) (ACUTE ONLY): 16 min   Charges:   PT Evaluation $PT Eval Low Complexity: [place], PT, DPT Acute Rehabilitation Services Office: [phone] Pager: [phone] Trena Platt 07/13/2018, 3:55 PM

## 2018-07-13 NOTE — Evaluation (Signed)
Occupational Therapy Evaluation Patient Details Name: [name] MRN: [id number] DOB: 1951-03-10 Today's Date: 07/13/2018    History of Present Illness Pt was admitted with RUQ pain.  PMH:  gallstone pancreatitis, s/p cholecystectomy, CKD, COPD, CAD, DM and HTN   Clinical Impression   This 67 year old female was admitted for the above. At baseline, she lives with daughter and had intermittent assistance with adls as needed due to RUQ pain.  Pt uses a RW to get around.  She has intermittent assistance as her daughter is a Pharmacist, hospital. Will follow in acute setting with supervision to min A level goals    Follow Up Recommendations  SNF    Equipment Recommendations  None recommended by OT    Recommendations for Other Services       Precautions / Restrictions Precautions Precautions: Fall Restrictions Weight Bearing Restrictions: No      Mobility Bed Mobility         Supine to sit: Mod assist Sit to supine: Min assist   General bed mobility comments: HOB raised. Assistance for trunk; light assist for legs for back to bed  Transfers   Equipment used: Rolling walker (2 wheeled)   Sit to Stand: Min guard Stand pivot transfers: Min guard       General transfer comment: close guard for safety    Balance     Sitting balance-Leahy Scale: Fair                                     ADL either performed or assessed with clinical judgement   ADL Overall ADL's : Needs assistance/impaired Eating/Feeding: Independent   Grooming: Set up;Sitting   Upper Body Bathing: Minimal assistance;Sitting   Lower Body Bathing: Minimal assistance;Moderate assistance;Sit to/from stand   Upper Body Dressing : Minimal assistance;Sitting   Lower Body Dressing: Moderate assistance;Bed level   Toilet Transfer: Minimal assistance;Stand-pivot;BSC;RW   Toileting- Clothing Manipulation and Hygiene: Min guard;Sit to/from stand         General ADL Comments: used 3;1  commode and requested to walk in hall      Vision         Perception     Praxis      Pertinent Vitals/Pain Pain Score: 9  Pain Location: RUQ Pain Descriptors / Indicators: Sore Pain Intervention(s): Limited activity within patient's tolerance;Monitored during session;Premedicated before session;Patient requesting pain meds-RN notified     Hand Dominance     Extremity/Trunk Assessment Upper Extremity Assessment Upper Extremity Assessment: Generalized weakness           Communication Communication Communication: No difficulties   Cognition Arousal/Alertness: Awake/alert Behavior During Therapy: WFL for tasks assessed/performed Overall Cognitive Status: Within Functional Limits for tasks assessed                                     General Comments       Exercises     Shoulder Instructions      Home Living Family/patient expects to be discharged to:: Private residence Living Arrangements: Children Available Help at Discharge: Family(works as school Pharmacist, hospital)               Bathroom Shower/Tub: Walk-in Psychologist, prison and probation services: Standard     Home Equipment: Environmental consultant - 4 wheels;Bedside commode;Shower seat - built in  Prior Functioning/Environment Level of Independence: Needs assistance    ADL's / Homemaking Assistance Needed: sometimes needs adl assistance            OT Problem List: Decreased strength;Decreased activity tolerance;Impaired balance (sitting and/or standing);Pain      OT Treatment/Interventions: Self-care/ADL training;Therapeutic exercise;Energy conservation;Therapeutic activities;Patient/family education;Balance training    OT Goals(Current goals can be found in the care plan section) Acute Rehab OT Goals Patient Stated Goal: go to rehab to regain strength OT Goal Formulation: With patient Time For Goal Achievement: 07/27/18 Potential to Achieve Goals: Good ADL Goals Pt Will Perform Lower Body Bathing:  with adaptive equipment;sit to/from stand;with supervision Pt Will Perform Lower Body Dressing: with min assist;sit to/from stand;with adaptive equipment Pt Will Transfer to Toilet: with supervision;ambulating;bedside commode Pt Will Perform Toileting - Clothing Manipulation and hygiene: with supervision;sit to/from stand Additional ADL Goal #1: pt will perform bed mobility at supervision level in preparation for adls Additional ADL Goal #2: pt will initiate at least one rest break without cues for energy conservation  OT Frequency: Min 2X/week   Barriers to D/C:            Co-evaluation              AM-PAC PT "6 Clicks" Daily Activity     Outcome Measure Help from another person eating meals?: None Help from another person taking care of personal grooming?: A Little Help from another person toileting, which includes using toliet, bedpan, or urinal?: A Little Help from another person bathing (including washing, rinsing, drying)?: A Lot Help from another person to put on and taking off regular upper body clothing?: A Little Help from another person to put on and taking off regular lower body clothing?: A Lot 6 Click Score: 17   End of Session    Activity Tolerance: Patient limited by pain Patient left: in bed;with call bell/phone within reach;with bed alarm set  OT Visit Diagnosis: Muscle weakness (generalized) (M62.81)                Time: 1165-7903 OT Time Calculation (min): 30 min Charges:  OT General Charges $OT Visit: 1 Visit OT Evaluation $OT Eval Low Complexity: 1 Low OT Treatments $Self Care/Home Management : 8-22 mins  [name], OTR/L Acute Rehabilitation Services [phone] [facility] pager [phone] office 07/13/2018  [name] 07/13/2018, 3:13 PM

## 2018-07-13 NOTE — Progress Notes (Signed)
Initial Nutrition Assessment  DOCUMENTATION CODES:   Non-severe (moderate) malnutrition in context of chronic illness  INTERVENTION:    Boost Breeze po TID, each supplement provides 250 kcal and 9 grams of protein  30 ml Prostat TID, each supplement provides 100 kcals and 15 grams protein.   Obtain recent weight  NUTRITION DIAGNOSIS:   Moderate Malnutrition related to chronic illness(CKD IV, pancreatitis) as evidenced by severe muscle depletion, moderate muscle depletion, moderate fat depletion.   GOAL:   Patient will meet greater than or equal to 90% of their needs  MONITOR:   PO intake, Supplement acceptance, Labs, Weight trends, I & O's  REASON FOR ASSESSMENT:   Malnutrition Screening Tool    ASSESSMENT:   Patient with PMH significant for AAA, acute cholecystitis s/p cholecystectomy, gallstone pancreatitis, CKD IV, COPD, CAD, DM, hypertension, NSTEMI, and pancreatitis with pseudocyst. Recently admitted to [facility] on 9/23 for never resolved pancreatitis and GIB. Presents this admission with upper abdominal pain.    11/2- US showed likely hematoma in gallbladder fossa 11/4- MRCP retained stone in cystic stump, no choledocholithiasis, fluid in gallbladder fossa concerning for possible post-op bile leak  RD familiar with this patient. During her recent admission she required tube feeding via Cortrak and diet was advanced to dysphagia 2 prior to her discharge (for ease of chewing, she now uses dentures). She was discharged to [facility] where she received three meals daily. Appetite increased significantly per pt reports and she consumed food options like chef salads, bacon, toast, jelly, and other meats.  She was provided [employer] and Prostat inconsistently and wishes she could have more. RD to provide her with these this stay. She is currently on a Heart Healthy diet and consumed 50% of her last two meals. Discussed the importance of protein intake for preservation of lean  body mass and to aid with wound healing.   Pt denies any further weight loss. Records indicate pt weighed 155 lb on 06/24/18 and 135 lb this admission (suspect this is a stated weight).  Nutrition-Focused physical exam completed. Suspect pt remains severely malnourished but will need to quantify wt loss leading up to this admission.   Medications reviewed and include: 40 mg lasix BID, Remeron once daily Labs reviewed.   NUTRITION - FOCUSED PHYSICAL EXAM:  See in flow chart.   Diet Order:   Diet Order            Diet Heart Room service appropriate? Yes; Fluid consistency: Thin  Diet effective now              EDUCATION NEEDS:   Education needs have been addressed  Skin:  Skin Assessment: Skin Integrity Issues: Skin Integrity Issues:: Stage II Stage II: sacrum  Last BM:  07/22/18  Height:   Ht Readings from Last 1 Encounters:  07/08/18 5\' 5"  (1.651 m)    Weight:   Wt Readings from Last 1 Encounters:  07/08/18 61.2 kg    Ideal Body Weight:  56.8 kg  BMI:  Body mass index is 22.45 kg/m.  Estimated Nutritional Needs:   Kcal:  1700-1900 kcal  Protein:  85-100 grams  Fluid:  >/= 1.7 L/day    [name] RD, LDN Clinical Nutrition Pager # - [phone]

## 2018-07-14 ENCOUNTER — Other Ambulatory Visit: Payer: Self-pay

## 2018-07-14 LAB — CREATININE, SERUM
Creatinine, Ser: 2.74 mg/dL — ABNORMAL HIGH (ref 0.44–1.00)
GFR calc non Af Amer: 17 mL/min — ABNORMAL LOW (ref >60)
GFR, EST AFRICAN AMERICAN: 20 mL/min — AB (ref >60)

## 2018-07-14 MED ORDER — OXYCODONE HCL 5 MG PO TABS
5.0000 mg | ORAL_TABLET | ORAL | Status: DC | PRN
  Administered 2018-07-14 – 2018-07-15 (×5): 5 mg via ORAL
  Filled 2018-07-14 (×5): qty 1

## 2018-07-14 MED ORDER — FAMOTIDINE 20 MG PO TABS: 20.0000 mg | ORAL_TABLET | Freq: Two times a day (BID) | ORAL | Status: DC

## 2018-07-14 MED ORDER — FAMOTIDINE 20 MG PO TABS
20.0000 mg | ORAL_TABLET | Freq: Every day | ORAL | Status: DC
  Administered 2018-07-14 – 2018-07-18 (×5): 20 mg via ORAL
  Filled 2018-07-14 (×5): qty 1

## 2018-07-14 MED ORDER — ALUM & MAG HYDROXIDE-SIMETH 200-200-20 MG/5ML PO SUSP
30.0000 mL | Freq: Once | ORAL | Status: AC
  Administered 2018-07-14: 30 mL via ORAL
  Filled 2018-07-14: qty 30

## 2018-07-14 MED ORDER — ALUM & MAG HYDROXIDE-SIMETH 200-200-20 MG/5ML PO SUSP
15.0000 mL | ORAL | Status: DC | PRN
  Administered 2018-07-14 – 2018-07-18 (×7): 15 mL via ORAL
  Filled 2018-07-14 (×7): qty 30

## 2018-07-14 MED ORDER — OXYCODONE HCL ER 10 MG PO T12A
10.0000 mg | EXTENDED_RELEASE_TABLET | Freq: Two times a day (BID) | ORAL | Status: DC
  Administered 2018-07-14 – 2018-07-19 (×11): 10 mg via ORAL
  Filled 2018-07-14 (×11): qty 1

## 2018-07-14 NOTE — Progress Notes (Signed)
PROGRESS NOTE    SWATI GRANBERRY  KDX:[id number] DOB: 23-Apr-1951 DOA: 07/08/2018 PCP: Bernerd Limbo, MD    Brief Narrative:  67 year old female who presented with right upper quadrant abdominal pain. She does have significant past medical history for gallstone pancreatitis status post cholecystectomy, chronic kidney disease, COPD, coronary disease, type 2 diabetes mellitus and hypertension. She developed sharp right upper quadrant abdominal pain, associated with nausea. On her initial physical examination blood pressure 160/50,her lungs are clear to auscultation bilaterally, heart S1-S2 present, irregular rhythm, abdomen tender to in the right upper quadrant, no lower extremity edema.  Patient was admitted to the hospital with working diagnosis of recurrent pancreatitis   Assessment & Plan:   Active Problems:   RUQ pain   Intraabdominal fluid collection   Cystic duct calculus   Fluid collection at surgical site   Stage II pressure ulcer (HCC)   Malnutrition of moderate degree   1. Acute pancreatitis complicated with pancreatic fluid collection.No bilary leak no HIDA scan, positive cystic duct dilatation with positive stone. Not candidate for endoscopic procedure, and no indication for surgery. Patient continue to have abdominal pain, has used about 25 mg of oxycodone over last 24 hours, will add long acting oxycodone 10 mg po bid and continue as needed oxycodone IR and hydromorphone, continue scheduled acetaminophen and gabapentin. Increase famotidine to 20 mg bid and will add one dose of GI cocktail.   2. AKI on stage IV CKD. Serum cr at  2,74. Continue to follow on renal function and electrolytes in am.  3. Chronic diastolic heart failure with acute exacerbation. Continue heart failure management with carvedilol, hydralazine and isosorbide.On furosemide po.   4. Abdominal aortic aneurysm. SP repair and stent to the common iliac artery. Antiplatelet therapy with clopidogrel.     5. Stage 2 sacrum pressure ulcer (present on admission). Continue local wound care, frequent turning. Will consult wound care team while patient in the hospital.    6. HTN. Continue with carvedilol, amlodipine, hydralazine and isosorbide, systolic blood pressure 976 mmHg .  7. Moderate calorie protein malnutrition.  Continue with nutritional supplements.    DVT prophylaxis:enoxaparin Code Status:full Family Communication:No family at the bedside.  Disposition Plan/ discharge barriers:pending placement  Body mass index is 22.45 kg/m. Malnutrition Type:  Nutrition Problem: Moderate Malnutrition Etiology: chronic illness(CKD IV, pancreatitis)   Malnutrition Characteristics:  Signs/Symptoms: severe muscle depletion, moderate muscle depletion, moderate fat depletion   Nutrition Interventions:  Interventions: Boost Breeze, Prostat  RN Pressure Injury Documentation: Pressure Injury 06/15/18 Stage I -  Intact skin with non-blanchable redness of a localized area usually over a bony prominence. moisture associated skin damage related to frequent diarrhea (Active)  06/15/18 1115   Location: Sacrum  Location Orientation: Posterior  Staging: Stage I -  Intact skin with non-blanchable redness of a localized area usually over a bony prominence.  Wound Description (Comments): moisture associated skin damage related to frequent diarrhea  Present on Admission:      Pressure Injury Stage II -  Partial thickness loss of dermis presenting as a shallow open ulcer with a red, pink wound bed without slough. (Active)      Location:   Location Orientation:   Staging: Stage II -  Partial thickness loss of dermis presenting as a shallow open ulcer with a red, pink wound bed without slough.  Wound Description (Comments):   Present on Admission:      Pressure Injury 07/08/18 Stage II -  Partial thickness loss of dermis presenting  as a shallow open ulcer with a red, pink wound bed  without slough. (Active)  07/08/18 1730   Location: Sacrum  Location Orientation: Posterior  Staging: Stage II -  Partial thickness loss of dermis presenting as a shallow open ulcer with a red, pink wound bed without slough.  Wound Description (Comments):   Present on Admission: Yes     Consultants:   GI   Surgery   Procedures:     Antimicrobials:       Subjective: Patient continue to have epigastric abdominal pain, improved temporary with analgesics, no nausea but poor appetite, no radiation and worse with movement.   Objective: Vitals:   07/14/18 0853 07/14/18 1141 07/14/18 1323 07/14/18 1325  BP: (!) 152/51   (!) 162/49  Pulse: 60  62 61  Resp:   16   Temp:   99.1 F (37.3 C)   TempSrc:   Oral   SpO2:  95% 98% 97%  Weight:      Height:        Intake/Output Summary (Last 24 hours) at 07/14/2018 1413 Last data filed at 07/14/2018 1246 Gross per 24 hour  Intake 1229.01 ml  Output 1350 ml  Net -120.99 ml   Filed Weights   07/08/18 1134  Weight: 61.2 kg    Examination:   General: deconditioned and ill looking appearing  Neurology: Awake and alert, non focal  E ENT: mild pallor, no icterus, oral mucosa moist Cardiovascular: No JVD. S1-S2 present, rhythmic, no gallops, rubs, or murmurs. No lower extremity edema. Pulmonary: decreased breath sounds bilaterally, adequate air movement, no wheezing, rhonchi or rales. Gastrointestinal. Abdomen mild distension with no organomegaly. Tender to palpation at the epigastrium, no rebound or guarding Skin. No rashes Musculoskeletal: no joint deformities     Data Reviewed: I have personally reviewed following labs and imaging studies  CBC: Recent Labs  Lab 07/09/18 0510 07/10/18 0452 07/11/18 0811 07/12/18 0438 07/13/18 0419  WBC 8.9 16.0* 12.1* 9.9 9.2  NEUTROABS  --   --   --   --  6.9  HGB 8.9* 7.4* 7.6* 8.3* 8.1*  HCT 30.3* 25.2* 25.3* 27.7* 27.2*  MCV 97.7 97.7 95.8 96.2 96.8  PLT 377 258 270 279 161    Basic Metabolic Panel: Recent Labs  Lab 07/09/18 0510 07/10/18 0452 07/11/18 0453 07/11/18 0811 07/12/18 0438 07/13/18 0419 07/14/18 0442  NA 144 143  --  137 138 141  --   K 3.8 4.1  --  4.2 4.9 4.4  --   CL 101 103  --  97* 98 98  --   CO2 32 29  --  30 29 30   --   GLUCOSE 132* 125*  --  133* 120* 133*  --   BUN 66* 69*  --  64* 63* 59*  --   CREATININE 2.59* 2.68* 2.67* 2.56* 2.71* 2.74* 2.74*  CALCIUM 8.9 8.2*  --  8.1* 8.5* 8.8*  --    GFR: Estimated Creatinine Clearance: 18.4 mL/min (A) (by C-G formula based on SCr of 2.74 mg/dL (H)). Liver Function Tests: Recent Labs  Lab 07/08/18 1152 07/09/18 0510 07/10/18 0452 07/11/18 0811 07/12/18 0438  AST 22 23 23 23 23   ALT 9 8 9 10 9   ALKPHOS 71 62 43 46 43  BILITOT 1.3* 0.8 1.2 0.7 0.9  PROT 6.7 5.6* 4.9* 5.4* 5.7*  ALBUMIN 3.2* 2.7* 2.2* 2.5* 2.5*   Recent Labs  Lab 07/08/18 1152 07/10/18 0452  LIPASE 305* 124*  No results for input(s): AMMONIA in the last 168 hours. Coagulation Profile: No results for input(s): INR, PROTIME in the last 168 hours. Cardiac Enzymes: Recent Labs  Lab 07/08/18 1152  TROPONINI 0.09*   BNP (last 3 results) No results for input(s): PROBNP in the last 8760 hours. HbA1C: No results for input(s): HGBA1C in the last 72 hours. CBG: No results for input(s): GLUCAP in the last 168 hours. Lipid Profile: No results for input(s): CHOL, HDL, LDLCALC, TRIG, CHOLHDL, LDLDIRECT in the last 72 hours. Thyroid Function Tests: No results for input(s): TSH, T4TOTAL, FREET4, T3FREE, THYROIDAB in the last 72 hours. Anemia Panel: No results for input(s): VITAMINB12, FOLATE, FERRITIN, TIBC, IRON, RETICCTPCT in the last 72 hours.    Radiology Studies: I have reviewed all of the imaging during this hospital visit personally     Scheduled Meds: . acetaminophen  650 mg Oral Q6H  . ALPRAZolam  0.5 mg Oral QHS  . amLODipine  10 mg Oral Daily  . atorvastatin  40 mg Oral QPM  . carvedilol   12.5 mg Oral BID WC  . clopidogrel  75 mg Oral Q breakfast  . famotidine  20 mg Oral BID  . feeding supplement  1 Container Oral TID BM  . feeding supplement (PRO-STAT SUGAR FREE 64)  30 mL Oral TID  . furosemide  40 mg Oral BID  . gabapentin  200 mg Oral TID  . hydrALAZINE  25 mg Oral Q8H  . isosorbide mononitrate  15 mg Oral Daily  . mouth rinse  15 mL Mouth Rinse BID  . mirtazapine  7.5 mg Oral QHS  . mometasone-formoterol  2 puff Inhalation BID  . nicotine  14 mg Transdermal Daily  . oxyCODONE  10 mg Oral Q12H   Continuous Infusions: . sodium chloride Stopped (07/13/18 1259)     LOS: 6 days        Sally Menard Gerome Apley, MD [facility] Pager [phone]

## 2018-07-14 NOTE — Care Management Note (Signed)
Case Management Note  Patient Details  Name: [name] MRN: [id number] Date of Birth: October 10, 1950  Subjective/Objective: Acute pancreatitis. From [employer]. PT recc SNF-CSW following.                   Action/Plan:dc SNF.   Expected Discharge Date:                  Expected Discharge Plan:  Skilled Nursing Facility  In-House Referral:  Clinical Social Work  Discharge planning Services  CM Consult  Post Acute Care Choice:    Choice offered to:     DME Arranged:    DME Agency:     HH Arranged:    [facility] Agency:     Status of Service:  Completed, signed off  If discussed at [employer], dates discussed:    Additional Comments:  Dessa Phi, RN 07/14/2018, 11:37 AM

## 2018-07-14 NOTE — Plan of Care (Signed)
  Problem: Pain Managment: Goal: General experience of comfort will improve Outcome: Progressing   

## 2018-07-15 LAB — CBC WITH DIFFERENTIAL/PLATELET
Abs Immature Granulocytes: 0.14 10*3/uL — ABNORMAL HIGH (ref 0.00–0.07)
BASOS ABS: 0.1 10*3/uL (ref 0.0–0.1)
BASOS PCT: 1 %
EOS PCT: 5 %
Eosinophils Absolute: 0.4 10*3/uL (ref 0.0–0.5)
HCT: 24.6 % — ABNORMAL LOW (ref 36.0–46.0)
Hemoglobin: 7.3 g/dL — ABNORMAL LOW (ref 12.0–15.0)
Immature Granulocytes: 2 %
Lymphocytes Relative: 13 %
Lymphs Abs: 1 10*3/uL (ref 0.7–4.0)
MCH: 28.7 pg (ref 26.0–34.0)
MCHC: 29.7 g/dL — AB (ref 30.0–36.0)
MCV: 96.9 fL (ref 80.0–100.0)
MONO ABS: 0.9 10*3/uL (ref 0.1–1.0)
Monocytes Relative: 11 %
NEUTROS ABS: 5.5 10*3/uL (ref 1.7–7.7)
NRBC: 0 % (ref 0.0–0.2)
Neutrophils Relative %: 68 %
PLATELETS: 261 10*3/uL (ref 150–400)
RBC: 2.54 MIL/uL — AB (ref 3.87–5.11)
RDW: 17.9 % — ABNORMAL HIGH (ref 11.5–15.5)
WBC: 8 10*3/uL (ref 4.0–10.5)

## 2018-07-15 LAB — CULTURE, BLOOD (ROUTINE X 2)
CULTURE: NO GROWTH
Culture: NO GROWTH
SPECIAL REQUESTS: ADEQUATE
SPECIAL REQUESTS: ADEQUATE

## 2018-07-15 LAB — BASIC METABOLIC PANEL
Anion gap: 12 (ref 5–15)
BUN: 56 mg/dL — AB (ref 8–23)
CALCIUM: 8.2 mg/dL — AB (ref 8.9–10.3)
CO2: 30 mmol/L (ref 22–32)
CREATININE: 2.52 mg/dL — AB (ref 0.44–1.00)
Chloride: 100 mmol/L (ref 98–111)
GFR calc Af Amer: 22 mL/min — ABNORMAL LOW (ref >60)
GFR, EST NON AFRICAN AMERICAN: 19 mL/min — AB (ref >60)
Glucose, Bld: 96 mg/dL (ref 70–99)
Potassium: 2.8 mmol/L — ABNORMAL LOW (ref 3.5–5.1)
SODIUM: 142 mmol/L (ref 135–145)

## 2018-07-15 MED ORDER — HYDROMORPHONE HCL 1 MG/ML IJ SOLN: 1.0000 mg | Freq: Four times a day (QID) | INTRAMUSCULAR | Status: DC | PRN

## 2018-07-15 MED ORDER — HYDROMORPHONE HCL 1 MG/ML IJ SOLN
1.0000 mg | INTRAMUSCULAR | Status: DC | PRN
  Administered 2018-07-15 – 2018-07-17 (×8): 1 mg via INTRAVENOUS
  Filled 2018-07-15 (×9): qty 1

## 2018-07-15 MED ORDER — OXYCODONE HCL 5 MG PO TABS
10.0000 mg | ORAL_TABLET | ORAL | Status: DC | PRN
  Administered 2018-07-15 – 2018-07-19 (×8): 10 mg via ORAL
  Filled 2018-07-15 (×9): qty 2

## 2018-07-15 MED ORDER — POTASSIUM CHLORIDE CRYS ER 20 MEQ PO TBCR
40.0000 meq | EXTENDED_RELEASE_TABLET | Freq: Once | ORAL | Status: AC
  Administered 2018-07-15: 40 meq via ORAL
  Filled 2018-07-15: qty 2

## 2018-07-15 MED ORDER — SUCRALFATE 1 G PO TABS
1.0000 g | ORAL_TABLET | Freq: Three times a day (TID) | ORAL | Status: DC
  Administered 2018-07-15 – 2018-07-19 (×17): 1 g via ORAL
  Filled 2018-07-15 (×17): qty 1

## 2018-07-15 NOTE — Progress Notes (Signed)
PROGRESS NOTE    EMMELINE WINEBARGER  EGB:[id number] DOB: 1950/10/31 DOA: 07/08/2018 PCP: Bernerd Limbo, MD    Brief Narrative:  67 year old female who presented with right upper quadrant abdominal pain. She does have significant past medical history for gallstone pancreatitis status post cholecystectomy, chronic kidney disease, COPD, coronary disease, type 2 diabetes mellitus and hypertension. She developed sharp right upper quadrant abdominal pain, associated with nausea. On her initial physical examination blood pressure 160/50,her lungs are clear to auscultation bilaterally, heart S1-S2 present, irregular rhythm, abdomen tender to in the right upper quadrant, no lower extremity edema.  Patient was admitted to the hospital with working diagnosis of recurrent pancreatitis   Assessment & Plan:   Active Problems:   RUQ pain   Intraabdominal fluid collection   Cystic duct calculus   Fluid collection at surgical site   Stage II pressure ulcer (HCC)   Malnutrition of moderate degree  1. Acute pancreatitis complicated with pancreatic fluid collection.No bilary leak on HIDA scan, positive cystic duct dilatation with positive stone. Not candidate for endoscopic procedure, and no indication for surgery. Improved pain control with long acting opiates, will continue as needed hydromorphone, had 3 doses yesterday, will change to as needed every 6 hours, continue oxycodone as needed q 4 hours. Add sucralfate and continue famotidine. Tolerating well soft diet.   2. AKI on stage IV CKD with hypokalemia.Stable renal function with serum cr at 2,52 with K at 2,8 and serum bicarbonate at 30. Will correct k with Kcl and follow on renal panel in am. Patient off IV fluids.   3. Chronic diastolic heart failure with acute exacerbation.Continue with carvedilol, hydralazine and isosorbide.Diuresis with furosemide po.   4. Abdominal aortic aneurysm. SP repair and stent to the common iliac artery.  Antiplatelet therapy with clopidogrel.   5. Stage 2 sacrum pressure ulcer (present on admission). Local wound care.   6. HTN.Blood pressure well controlled withcarvedilol, amlodipine, hydralazine and isosorbide.  7. Moderate calorie protein malnutrition. On nutritional supplements.   DVT prophylaxis:enoxaparin Code Status:full Family Communication:No family at the bedside. Disposition Plan/ discharge barriers:pending placement   Body mass index is 22.45 kg/m. Malnutrition Type:  Nutrition Problem: Moderate Malnutrition Etiology: chronic illness(CKD IV, pancreatitis)   Malnutrition Characteristics:  Signs/Symptoms: severe muscle depletion, moderate muscle depletion, moderate fat depletion   Nutrition Interventions:  Interventions: Boost Breeze, Prostat  RN Pressure Injury Documentation: Pressure Injury 06/15/18 Stage I -  Intact skin with non-blanchable redness of a localized area usually over a bony prominence. moisture associated skin damage related to frequent diarrhea (Active)  06/15/18 1115   Location: Sacrum  Location Orientation: Posterior  Staging: Stage I -  Intact skin with non-blanchable redness of a localized area usually over a bony prominence.  Wound Description (Comments): moisture associated skin damage related to frequent diarrhea  Present on Admission:      Pressure Injury Stage II -  Partial thickness loss of dermis presenting as a shallow open ulcer with a red, pink wound bed without slough. (Active)      Location:   Location Orientation:   Staging: Stage II -  Partial thickness loss of dermis presenting as a shallow open ulcer with a red, pink wound bed without slough.  Wound Description (Comments):   Present on Admission:      Pressure Injury 07/08/18 Stage II -  Partial thickness loss of dermis presenting as a shallow open ulcer with a red, pink wound bed without slough. (Active)  07/08/18 1730   Location:  Sacrum  Location  Orientation: Posterior  Staging: Stage II -  Partial thickness loss of dermis presenting as a shallow open ulcer with a red, pink wound bed without slough.  Wound Description (Comments):   Present on Admission: Yes     Consultants:     Procedures:     Antimicrobials:       Subjective: Pain is still present, epigastrium, improved with analgesics and using less IV hydromorphone. Pain associated with poor appetite, no radiation, no nausea or vomiting.   Objective: Vitals:   07/14/18 2059 07/14/18 2119 07/15/18 0515 07/15/18 0828  BP:  (!) 156/64 (!) 170/52 (!) 160/44  Pulse:  66 (!) 54 (!) 59  Resp:  16 16   Temp:  98.7 F (37.1 C) 97.7 F (36.5 C)   TempSrc:  Oral Oral   SpO2: 96% 96% 99% 97%  Weight:      Height:        Intake/Output Summary (Last 24 hours) at 07/15/2018 1122 Last data filed at 07/14/2018 1913 Gross per 24 hour  Intake 1100 ml  Output 550 ml  Net 550 ml   Filed Weights   07/08/18 1134  Weight: 61.2 kg    Examination:   General: deconditioned  Neurology: Awake and alert, non focal  E ENT: mild pallor, no icterus, oral mucosa moist Cardiovascular: No JVD. S1-S2 present, rhythmic, no gallops, rubs, or murmurs. No lower extremity edema. Pulmonary: decreased breath sounds bilaterally, adequate air movement, no wheezing, rhonchi or rales. Gastrointestinal. Abdomen mild distention with no organomegaly, tender to deep palpation in the eipgastrium, no rebound or guarding Skin. No rashes Musculoskeletal: no joint deformities     Data Reviewed: I have personally reviewed following labs and imaging studies  CBC: Recent Labs  Lab 07/10/18 0452 07/11/18 0811 07/12/18 0438 07/13/18 0419 07/15/18 0404  WBC 16.0* 12.1* 9.9 9.2 8.0  NEUTROABS  --   --   --  6.9 5.5  HGB 7.4* 7.6* 8.3* 8.1* 7.3*  HCT 25.2* 25.3* 27.7* 27.2* 24.6*  MCV 97.7 95.8 96.2 96.8 96.9  PLT 258 270 279 287 932   Basic Metabolic Panel: Recent Labs  Lab 07/10/18 0452   07/11/18 0811 07/12/18 0438 07/13/18 0419 07/14/18 0442 07/15/18 0404  NA 143  --  137 138 141  --  142  K 4.1  --  4.2 4.9 4.4  --  2.8*  CL 103  --  97* 98 98  --  100  CO2 29  --  30 29 30   --  30  GLUCOSE 125*  --  133* 120* 133*  --  96  BUN 69*  --  64* 63* 59*  --  56*  CREATININE 2.68*   < > 2.56* 2.71* 2.74* 2.74* 2.52*  CALCIUM 8.2*  --  8.1* 8.5* 8.8*  --  8.2*   < > = values in this interval not displayed.   GFR: Estimated Creatinine Clearance: 20 mL/min (A) (by C-G formula based on SCr of 2.52 mg/dL (H)). Liver Function Tests: Recent Labs  Lab 07/09/18 0510 07/10/18 0452 07/11/18 0811 07/12/18 0438  AST 23 23 23 23   ALT 8 9 10 9   ALKPHOS 62 43 46 43  BILITOT 0.8 1.2 0.7 0.9  PROT 5.6* 4.9* 5.4* 5.7*  ALBUMIN 2.7* 2.2* 2.5* 2.5*   Recent Labs  Lab 07/10/18 0452  LIPASE 124*   No results for input(s): AMMONIA in the last 168 hours. Coagulation Profile: No results for input(s): INR, PROTIME  in the last 168 hours. Cardiac Enzymes: No results for input(s): CKTOTAL, CKMB, CKMBINDEX, TROPONINI in the last 168 hours. BNP (last 3 results) No results for input(s): PROBNP in the last 8760 hours. HbA1C: No results for input(s): HGBA1C in the last 72 hours. CBG: No results for input(s): GLUCAP in the last 168 hours. Lipid Profile: No results for input(s): CHOL, HDL, LDLCALC, TRIG, CHOLHDL, LDLDIRECT in the last 72 hours. Thyroid Function Tests: No results for input(s): TSH, T4TOTAL, FREET4, T3FREE, THYROIDAB in the last 72 hours. Anemia Panel: No results for input(s): VITAMINB12, FOLATE, FERRITIN, TIBC, IRON, RETICCTPCT in the last 72 hours.    Radiology Studies: I have reviewed all of the imaging during this hospital visit personally     Scheduled Meds: . acetaminophen  650 mg Oral Q6H  . ALPRAZolam  0.5 mg Oral QHS  . amLODipine  10 mg Oral Daily  . atorvastatin  40 mg Oral QPM  . carvedilol  12.5 mg Oral BID WC  . clopidogrel  75 mg Oral Q  breakfast  . famotidine  20 mg Oral QHS  . feeding supplement  1 Container Oral TID BM  . feeding supplement (PRO-STAT SUGAR FREE 64)  30 mL Oral TID  . furosemide  40 mg Oral BID  . gabapentin  200 mg Oral TID  . hydrALAZINE  25 mg Oral Q8H  . isosorbide mononitrate  15 mg Oral Daily  . mouth rinse  15 mL Mouth Rinse BID  . mirtazapine  7.5 mg Oral QHS  . mometasone-formoterol  2 puff Inhalation BID  . nicotine  14 mg Transdermal Daily  . oxyCODONE  10 mg Oral Q12H  . potassium chloride  40 mEq Oral Once  . sucralfate  1 g Oral TID WC & HS   Continuous Infusions: . sodium chloride Stopped (07/13/18 1259)     LOS: 7 days        Raea Magallon Gerome Apley, MD [facility] Pager [phone]

## 2018-07-16 LAB — BASIC METABOLIC PANEL
ANION GAP: 9 (ref 5–15)
BUN: 56 mg/dL — ABNORMAL HIGH (ref 8–23)
CO2: 31 mmol/L (ref 22–32)
Calcium: 8.2 mg/dL — ABNORMAL LOW (ref 8.9–10.3)
Chloride: 100 mmol/L (ref 98–111)
Creatinine, Ser: 2.39 mg/dL — ABNORMAL HIGH (ref 0.44–1.00)
GFR calc Af Amer: 23 mL/min — ABNORMAL LOW (ref >60)
GFR, EST NON AFRICAN AMERICAN: 20 mL/min — AB (ref >60)
GLUCOSE: 124 mg/dL — AB (ref 70–99)
POTASSIUM: 3 mmol/L — AB (ref 3.5–5.1)
SODIUM: 140 mmol/L (ref 135–145)

## 2018-07-16 MED ORDER — POTASSIUM CHLORIDE CRYS ER 20 MEQ PO TBCR
40.0000 meq | EXTENDED_RELEASE_TABLET | Freq: Once | ORAL | Status: AC
  Administered 2018-07-16: 40 meq via ORAL
  Filled 2018-07-16: qty 2

## 2018-07-16 MED ORDER — FUROSEMIDE 40 MG PO TABS
40.0000 mg | ORAL_TABLET | Freq: Every day | ORAL | Status: DC
  Administered 2018-07-17 – 2018-07-19 (×3): 40 mg via ORAL
  Filled 2018-07-16 (×3): qty 1

## 2018-07-16 NOTE — Progress Notes (Signed)
PROGRESS NOTE    [name]  DXI:[id number] DOB: 1951/08/19 DOA: 07/08/2018 PCP: Bernerd Limbo, MD    Brief Narrative:  67 year old female who presented with right upper quadrant abdominal pain. She does have significant past medical history for gallstone pancreatitis status post cholecystectomy, chronic kidney disease, COPD, coronary disease, type 2 diabetes mellitus and hypertension. She developed sharp right upper quadrant abdominal pain, associated with nausea. On her initial physical examination blood pressure 160/50,her lungs are clear to auscultation bilaterally, heart S1-S2 present, irregular rhythm, abdomen tender to in the right upper quadrant, no lower extremity edema.  Patient was admitted to the hospital with working diagnosis of recurrent pancreatitis   Assessment & Plan:   Active Problems:   RUQ pain   Intraabdominal fluid collection   Cystic duct calculus   Fluid collection at surgical site   Stage II pressure ulcer (HCC)   Malnutrition of moderate degree   1. Acute pancreatitis complicated with pancreatic fluid collection.No bilary leak on HIDA scan, positive cystic duct dilatation with positive stone. Not candidate for endoscopic procedure, and no indication for surgery. Pain has decreases bu still requiring IV hydromorphone as needed, will continue pain control with long acting opiates oxycodone 10 mg bid, oxycodone IR 10 mg as needed q 4 hours and scheduled acetaminophen. Continue sucralfate and continue famotidine, for antiacid therapy. Patient is tolerating po well.   2. AKI on stage IV CKD with hypokalemia. Renal function with serum cr at 2,39, with K of 3,0, and serum bicarbonate at 31, will correct hypokalemia with 40 meq of kcl and will follow on renal panel in am, decrease furosemide to daily for now.   3. Chronic diastolic heart failure with acute exacerbation.Heart failure management  with carvedilol, hydralazine and isosorbide.Decreased  furosemide to daily to keep negative fluid balance.   4. Abdominal aortic aneurysm. SP repair and stent to the common iliac artery.Continue with antiplatelet therapy withclopidogrel.  5. Stage 2 sacrum pressure ulcer (present on admission). Continue with localwound care, out of bed as tolerated, will need SNF at discharge.    6. HTN.Continuecarvedilol, amlodipine, hydralazine and isosorbide, for blood pressure control.   7. Moderate calorie protein malnutrition.Continue with nutritional supplements.   DVT prophylaxis:enoxaparin Code Status:full Family Communication:No family at the bedside. Disposition Plan/ discharge barriers:pending placement  Body mass index is 22.45 kg/m. Malnutrition Type:  Nutrition Problem: Moderate Malnutrition Etiology: chronic illness(CKD IV, pancreatitis)   Malnutrition Characteristics:  Signs/Symptoms: severe muscle depletion, moderate muscle depletion, moderate fat depletion   Nutrition Interventions:  Interventions: Boost Breeze, Prostat  RN Pressure Injury Documentation: Pressure Injury 06/15/18 Stage I -  Intact skin with non-blanchable redness of a localized area usually over a bony prominence. moisture associated skin damage related to frequent diarrhea (Active)  06/15/18 1115   Location: Sacrum  Location Orientation: Posterior  Staging: Stage I -  Intact skin with non-blanchable redness of a localized area usually over a bony prominence.  Wound Description (Comments): moisture associated skin damage related to frequent diarrhea  Present on Admission:      Pressure Injury Stage II -  Partial thickness loss of dermis presenting as a shallow open ulcer with a red, pink wound bed without slough. (Active)      Location:   Location Orientation:   Staging: Stage II -  Partial thickness loss of dermis presenting as a shallow open ulcer with a red, pink wound bed without slough.  Wound Description (Comments):   Present  on Admission:  Pressure Injury 07/08/18 Stage II -  Partial thickness loss of dermis presenting as a shallow open ulcer with a red, pink wound bed without slough. (Active)  07/08/18 1730   Location: Sacrum  Location Orientation: Posterior  Staging: Stage II -  Partial thickness loss of dermis presenting as a shallow open ulcer with a red, pink wound bed without slough.  Wound Description (Comments):   Present on Admission: Yes     Consultants:     Procedures:     Antimicrobials:       Subjective: Patient with less intensity, no nausea or vomiting, continue to require IV hydromorphone for break through pain. Very weak and deconditioned.   Objective: Vitals:   07/16/18 0554 07/16/18 0750 07/16/18 0812 07/16/18 0950  BP: (!) 151/45  (!) 159/45 (!) 183/50  Pulse: (!) 56  66 68  Resp:   16   Temp:   98.9 F (37.2 C)   TempSrc:   Oral   SpO2:  98% 99% 100%  Weight:      Height:       No intake or output data in the 24 hours ending 07/16/18 1108 Filed Weights   07/08/18 1134  Weight: 61.2 kg    Examination:   General: deconditioned  Neurology: Awake and alert, non focal  E ENT: mild pallor, no icterus, oral mucosa moist Cardiovascular: No JVD. S1-S2 present, rhythmic, no gallops, rubs, or murmurs. No lower extremity edema. Pulmonary: vesicular breath sounds bilaterally, adequate air movement, no wheezing, rhonchi or rales. Gastrointestinal. Abdomen with no organomegaly, no rebound or guarding. Tender to deep palpation at the epigastric region.  Skin. No rashes Musculoskeletal: no joint deformities     Data Reviewed: I have personally reviewed following labs and imaging studies  CBC: Recent Labs  Lab 07/10/18 0452 07/11/18 0811 07/12/18 0438 07/13/18 0419 07/15/18 0404  WBC 16.0* 12.1* 9.9 9.2 8.0  NEUTROABS  --   --   --  6.9 5.5  HGB 7.4* 7.6* 8.3* 8.1* 7.3*  HCT 25.2* 25.3* 27.7* 27.2* 24.6*  MCV 97.7 95.8 96.2 96.8 96.9  PLT 258 270 279 287  254   Basic Metabolic Panel: Recent Labs  Lab 07/11/18 0811 07/12/18 0438 07/13/18 0419 07/14/18 0442 07/15/18 0404 07/16/18 0444  NA 137 138 141  --  142 140  K 4.2 4.9 4.4  --  2.8* 3.0*  CL 97* 98 98  --  100 100  CO2 30 29 30   --  30 31  GLUCOSE 133* 120* 133*  --  96 124*  BUN 64* 63* 59*  --  56* 56*  CREATININE 2.56* 2.71* 2.74* 2.74* 2.52* 2.39*  CALCIUM 8.1* 8.5* 8.8*  --  8.2* 8.2*   GFR: Estimated Creatinine Clearance: 21.1 mL/min (A) (by C-G formula based on SCr of 2.39 mg/dL (H)). Liver Function Tests: Recent Labs  Lab 07/10/18 0452 07/11/18 0811 07/12/18 0438  AST 23 23 23   ALT 9 10 9   ALKPHOS 43 46 43  BILITOT 1.2 0.7 0.9  PROT 4.9* 5.4* 5.7*  ALBUMIN 2.2* 2.5* 2.5*   Recent Labs  Lab 07/10/18 0452  LIPASE 124*   No results for input(s): AMMONIA in the last 168 hours. Coagulation Profile: No results for input(s): INR, PROTIME in the last 168 hours. Cardiac Enzymes: No results for input(s): CKTOTAL, CKMB, CKMBINDEX, TROPONINI in the last 168 hours. BNP (last 3 results) No results for input(s): PROBNP in the last 8760 hours. HbA1C: No results for input(s): HGBA1C in  the last 72 hours. CBG: No results for input(s): GLUCAP in the last 168 hours. Lipid Profile: No results for input(s): CHOL, HDL, LDLCALC, TRIG, CHOLHDL, LDLDIRECT in the last 72 hours. Thyroid Function Tests: No results for input(s): TSH, T4TOTAL, FREET4, T3FREE, THYROIDAB in the last 72 hours. Anemia Panel: No results for input(s): VITAMINB12, FOLATE, FERRITIN, TIBC, IRON, RETICCTPCT in the last 72 hours.    Radiology Studies: I have reviewed all of the imaging during this hospital visit personally     Scheduled Meds: . acetaminophen  650 mg Oral Q6H  . ALPRAZolam  0.5 mg Oral QHS  . amLODipine  10 mg Oral Daily  . atorvastatin  40 mg Oral QPM  . carvedilol  12.5 mg Oral BID WC  . clopidogrel  75 mg Oral Q breakfast  . famotidine  20 mg Oral QHS  . feeding supplement   1 Container Oral TID BM  . feeding supplement (PRO-STAT SUGAR FREE 64)  30 mL Oral TID  . furosemide  40 mg Oral BID  . gabapentin  200 mg Oral TID  . hydrALAZINE  25 mg Oral Q8H  . isosorbide mononitrate  15 mg Oral Daily  . mouth rinse  15 mL Mouth Rinse BID  . mirtazapine  7.5 mg Oral QHS  . mometasone-formoterol  2 puff Inhalation BID  . nicotine  14 mg Transdermal Daily  . oxyCODONE  10 mg Oral Q12H  . sucralfate  1 g Oral TID WC & HS   Continuous Infusions: . sodium chloride Stopped (07/13/18 1259)     LOS: 8 days        Caitriona Sundquist Gerome Apley, MD [facility] Pager [phone]

## 2018-07-16 NOTE — Progress Notes (Signed)
Report received from B. Richardson,RN. No change in assessment. Stacey Drain

## 2018-07-17 ENCOUNTER — Ambulatory Visit: Payer: Self-pay | Admitting: Physician Assistant

## 2018-07-17 LAB — BASIC METABOLIC PANEL
Anion gap: 8 (ref 5–15)
BUN: 56 mg/dL — ABNORMAL HIGH (ref 8–23)
CHLORIDE: 101 mmol/L (ref 98–111)
CO2: 31 mmol/L (ref 22–32)
Calcium: 8.3 mg/dL — ABNORMAL LOW (ref 8.9–10.3)
Creatinine, Ser: 2.3 mg/dL — ABNORMAL HIGH (ref 0.44–1.00)
GFR calc Af Amer: 24 mL/min — ABNORMAL LOW (ref >60)
GFR calc non Af Amer: 21 mL/min — ABNORMAL LOW (ref >60)
GLUCOSE: 116 mg/dL — AB (ref 70–99)
POTASSIUM: 3.5 mmol/L (ref 3.5–5.1)
Sodium: 140 mmol/L (ref 135–145)

## 2018-07-17 MED ORDER — HYDROMORPHONE HCL 1 MG/ML IJ SOLN
0.5000 mg | INTRAMUSCULAR | Status: DC | PRN
  Administered 2018-07-17 – 2018-07-19 (×7): 0.5 mg via INTRAVENOUS
  Filled 2018-07-17 (×7): qty 0.5

## 2018-07-17 MED ORDER — ENOXAPARIN SODIUM 30 MG/0.3ML ~~LOC~~ SOLN
30.0000 mg | SUBCUTANEOUS | Status: DC
  Administered 2018-07-17 – 2018-07-19 (×3): 30 mg via SUBCUTANEOUS
  Filled 2018-07-17 (×3): qty 0.3

## 2018-07-17 NOTE — NC FL2 (Signed)
[place] MEDICAID FL2 LEVEL OF CARE SCREENING TOOL     IDENTIFICATION  Patient Name: [name] Birthdate: 12/30/1950 Sex: female Admission Date (Current Location): 07/08/2018  [facility] and [place] Number:  Herbalist and Address:  [facility],  [place], [place]      Provider Number: [id number]  Attending Physician Name and Address:  Tawni Millers  Relative Name and Phone Number:  [name], daughter, [phone]    Current Level of Care: Hospital Recommended Level of Care: [place] Prior Approval Number:    Date Approved/Denied:   PASRR Number: [id number]  Discharge Plan: SNF    Current Diagnoses: Patient Active Problem List   Diagnosis Date Noted  . Malnutrition of moderate degree 07/13/2018  . Stage II pressure ulcer ([place]) 07/12/2018  . Fluid collection at surgical site   . Intraabdominal fluid collection   . Cystic duct calculus   . RUQ pain 07/08/2018  . Pancreatic pseudocyst 07/04/2018  . 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 Urinary retention 07/04/2018  . S/P insertion of iliac artery stent 07/04/2018  . Endoleak post (EVAR) endovascular aneurysm repair ([place]) 07/04/2018  . Physical deconditioning   . Esophageal ulcer with bleeding   . Pressure injury of skin 06/16/2018  . Hematemesis   . AKI (acute kidney injury) ([place]) 06/09/2018  . Palliative care patient   . Palliative care by specialist   . Hyperkalemia 06/08/2018  . Anasarca 06/07/2018  . Demand ischemia ([place]) 06/07/2018  . Protein-calorie malnutrition, severe 05/29/2018  . Type II diabetes mellitus with renal manifestations ([place]) 05/28/2018  . Pancreatitis, recurrent 05/28/2018  . SIRS (systemic inflammatory response syndrome) ([place]) 05/28/2018  . Pancreatitis 05/28/2018  . Volume overload 04/17/2018  . Anemia due to GI blood loss 04/14/2018  . Acute gallstone pancreatitis 04/14/2018  . Acute cholecystitis 04/14/2018  .  Gastrointestinal hemorrhage   . Fatigue associated with anemia 04/09/2018  . Hypoglycemia 01/19/2018  . Type II diabetes mellitus ([place]) 01/19/2018  . Nausea with vomiting 01/19/2018  . Uncontrolled hypertension 01/19/2018  . Hyperlipidemia 01/19/2018  . CAD (coronary artery disease) 01/19/2018  . COPD with hypoxia ([place]) 01/19/2018  . Chronic kidney disease (CKD), stage IV (severe) ([place]) 01/19/2018  . Retroperitoneal hematoma 01/19/2018  . Acute blood loss anemia 01/19/2018  . Chronic diastolic congestive heart failure ([place]) 01/19/2018  . AAA (abdominal aortic aneurysm) ([place]) 01/16/2018  . Elevated brain natriuretic peptide (BNP) level 10/07/2015  . Diabetes mellitus ([place]) 10/07/2015  . Pain in the chest   . PAD (peripheral artery disease) ([place]) 07/29/2015  . Chest pain 07/04/2015  . Acute on chronic kidney failure ([place]) 07/04/2015  . GERD (gastroesophageal reflux disease) 07/04/2015  . Anemia 06/26/2015  . Coronary artery disease involving autologous vein coronary bypass graft with unstable angina pectoris ([place]) 06/24/2015  . Angina at rest [facility])   . Subclavian artery stenosis, left ([place]) 06/23/2015  . AAA (abdominal aortic aneurysm) without rupture ([place]) 12/10/2014  . Subclavian steal syndrome 12/10/2014  . Renal artery stenosis ([place]) 09/04/2013  . Carotid artery stenosis 09/28/2012  . Mitral valve disorder 06/23/2011  . S/P CABG x 3 06/16/2011  . S/P Maze operation for atrial fibrillation 06/16/2011  . S/P MVR (mitral valve repair) 06/16/2011  . Follow-up examination following surgery 03/29/2011  . CKD (chronic kidney disease) 03/03/2011  . COPD (chronic obstructive pulmonary disease) ([place]) 03/03/2011  . Chronic diastolic heart failure ([name]) 02/26/2011  . Hypotension 02/26/2011  . NSTEMI (non-ST elevated myocardial infarction) (  [place]) 02/26/2011  . Long term (current) use of anticoagulants 12/17/2010  . OBSTRUCTIVE SLEEP APNEA 09/04/2010  . HYPOXEMIA 09/04/2010  . SNORING  08/05/2010  . Atrial fibrillation ([place]) 12/09/2009  . WEIGHT LOSS 03/28/2009  . PALPITATIONS 03/28/2009  . PVD 11/21/2008  . HYPERLIPIDEMIA-MIXED 11/20/2008  . TOBACCO ABUSE 11/20/2008  . Essential hypertension 11/20/2008  . Coronary artery disease involving native heart with angina pectoris ([place]) 11/20/2008  . ABDOMINAL AORTIC ANEURYSM 11/20/2008    Orientation RESPIRATION BLADDER Height & Weight     Self, Situation, Place, Time  Normal Continent Weight: 134 lb 14.7 oz (61.2 kg) Height:  5\' 5"  (165.1 cm)  BEHAVIORAL SYMPTOMS/MOOD NEUROLOGICAL BOWEL NUTRITION STATUS      Continent Diet  AMBULATORY STATUS COMMUNICATION OF NEEDS Skin   Limited Assist Verbally                         Personal Care Assistance Level of Assistance  Bathing, Feeding, Dressing Bathing Assistance: Limited assistance Feeding assistance: Independent Dressing Assistance: Limited assistance     Functional Limitations Info  Sight, Hearing, Speech Sight Info: Adequate Hearing Info: Adequate Speech Info: Adequate    SPECIAL CARE FACTORS FREQUENCY  PT (By licensed PT), OT (By licensed OT)     PT Frequency: Minimum 2x weekly OT Frequency: Minimum 2x weekly            Contractures Contractures Info: Not present    Additional Factors Info  Code Status, Allergies, Psychotropic, Insulin Sliding Scale Code Status Info: Full Allergies Info: Info: Allergies:  Isosorbide, Isosorbide Nitrate, Oxycodone-acetaminophen, Codeine, Contrast Media Iodinated Diagnostic Agents, Ioxaglate, Metrizamide, Percocet Oxycodone-acetaminophen Psychotropic Info: Xanax, gabapentin Insulin Sliding Scale Info: Every 4 hours       Current Medications (07/17/2018):  This is the current hospital active medication list Current Facility-Administered Medications  Medication Dose Route Frequency Provider Last Rate Last Dose  . 0.9 %  sodium chloride infusion   Intravenous PRN Oswald Hillock, MD   Stopped at 07/13/18 1259   . acetaminophen (TYLENOL) tablet 650 mg  650 mg Oral Q6H Rayburn, Kelly A, PA-C   650 mg at 07/17/18 1113  . albuterol (PROVENTIL) (2.5 MG/3ML) 0.083% nebulizer solution 2.5 mg  2.5 mg Nebulization Q4H PRN Mariel Aloe, MD      . ALPRAZolam Duanne Moron) tablet 0.5 mg  0.5 mg Oral QHS Mariel Aloe, MD   0.5 mg at 07/16/18 2118  . alum & mag hydroxide-simeth (MAALOX/MYLANTA) 200-200-20 MG/5ML suspension 15 mL  15 mL Oral Q4H PRN Arrien, Jimmy Picket, MD   15 mL at 07/16/18 2126  . amLODipine (NORVASC) tablet 10 mg  10 mg Oral Daily Mariel Aloe, MD   10 mg at 07/17/18 1113  . atorvastatin (LIPITOR) tablet 40 mg  40 mg Oral QPM Mariel Aloe, MD   40 mg at 07/16/18 1833  . bisacodyl (DULCOLAX) suppository 10 mg  10 mg Rectal Daily PRN Mariel Aloe, MD      . carvedilol (COREG) tablet 12.5 mg  12.5 mg Oral BID WC Mariel Aloe, MD   12.5 mg at 07/17/18 0817  . clopidogrel (PLAVIX) tablet 75 mg  75 mg Oral Q breakfast Oswald Hillock, MD   75 mg at 07/17/18 0817  . diphenhydrAMINE (BENADRYL) capsule 25 mg  25 mg Oral Q6H PRN Rayburn, Kelly A, PA-C      . enoxaparin (LOVENOX) injection 30 mg  30 mg Subcutaneous Q24H Arrien, [employer]  Quillian Quince, MD   30 mg at 07/17/18 1117  . famotidine (PEPCID) tablet 20 mg  20 mg Oral QHS Tawni Millers, MD   20 mg at 07/16/18 2117  . feeding supplement (BOOST / RESOURCE BREEZE) liquid 1 Container  1 Container Oral TID BM Rayburn, Kelly A, PA-C   1 Container at 07/17/18 1117  . feeding supplement (PRO-STAT SUGAR FREE 64) liquid 30 mL  30 mL Oral TID Arrien, Jimmy Picket, MD   30 mL at 07/17/18 1112  . furosemide (LASIX) tablet 40 mg  40 mg Oral Daily Arrien, Jimmy Picket, MD   40 mg at 07/17/18 1114  . gabapentin (NEURONTIN) capsule 200 mg  200 mg Oral TID Tawni Millers, MD   200 mg at 07/17/18 1115  . hydrALAZINE (APRESOLINE) tablet 25 mg  25 mg Oral Q8H Mariel Aloe, MD   25 mg at 07/17/18 0610  . HYDROmorphone (DILAUDID) injection  0.5 mg  0.5 mg Intravenous Q4H PRN Arrien, Jimmy Picket, MD   0.5 mg at 07/17/18 1127  . isosorbide mononitrate (IMDUR) 24 hr tablet 15 mg  15 mg Oral Daily Mariel Aloe, MD   15 mg at 07/17/18 1115  . loperamide (IMODIUM) capsule 2 mg  2 mg Oral Daily PRN Oswald Hillock, MD   2 mg at 07/16/18 0556  . MEDLINE mouth rinse  15 mL Mouth Rinse BID Mariel Aloe, MD   15 mL at 07/17/18 1116  . mirtazapine (REMERON) tablet 7.5 mg  7.5 mg Oral QHS Mariel Aloe, MD   7.5 mg at 07/16/18 2118  . mometasone-formoterol (DULERA) 200-5 MCG/ACT inhaler 2 puff  2 puff Inhalation BID Mariel Aloe, MD   2 puff at 07/17/18 0740  . nicotine (NICODERM CQ - dosed in mg/24 hours) patch 14 mg  14 mg Transdermal Daily Mariel Aloe, MD   14 mg at 07/17/18 1116  . ondansetron (ZOFRAN) tablet 4 mg  4 mg Oral Q6H PRN Mariel Aloe, MD       Or  . ondansetron [facility]) injection 4 mg  4 mg Intravenous Q6H PRN Mariel Aloe, MD      . oxyCODONE (Oxy IR/ROXICODONE) immediate release tablet 10 mg  10 mg Oral Q4H PRN Arrien, Jimmy Picket, MD   10 mg at 07/16/18 2029  . oxyCODONE (OXYCONTIN) 12 hr tablet 10 mg  10 mg Oral Q12H Arrien, Jimmy Picket, MD   10 mg at 07/17/18 1113  . sucralfate (CARAFATE) tablet 1 g  1 g Oral TID WC & HS Arrien, Jimmy Picket, MD   1 g at 07/17/18 5701     Discharge Medications: Please see discharge summary for a list of discharge medications.  Relevant Imaging Results:  Relevant Lab Results:   Additional Information SS# [id number]  Joellen [place], [place]

## 2018-07-17 NOTE — Progress Notes (Signed)
PROGRESS NOTE    [name]  PPJ:[id number] DOB: December 23, 1950 DOA: 07/08/2018 PCP: Bernerd Limbo, MD    Brief Narrative:  67 year old female who presented with right upper quadrant abdominal pain. She does have significant past medical history for gallstone pancreatitis status post cholecystectomy, chronic kidney disease, COPD, coronary disease, type 2 diabetes mellitus and hypertension. She developed sharp right upper quadrant abdominal pain, associated with nausea. On her initial physical examination blood pressure 160/50,her lungs are clear to auscultation bilaterally, heart S1-S2 present, irregular rhythm, abdomen tender to in the right upper quadrant, no lower extremity edema.  Patient was admitted to the hospital with working diagnosis of recurrent pancreatitis   Assessment & Plan:   Active Problems:   RUQ pain   Intraabdominal fluid collection   Cystic duct calculus   Fluid collection at surgical site   Stage II pressure ulcer (HCC)   Malnutrition of moderate degree   1. Acute pancreatitis complicated with pancreatic fluid collection.No bilary leakonHIDA scan, positive cystic duct dilatation with positive stone. Not candidate for endoscopic procedure, and no indication for surgery.Continue pain control with long acting opiates oxycodone 10 mg bid, oxycodone IR 10 mg as needed q 4 hours along with scheduled acetaminophen. Antiacid therapy with sucralfate and famotidine. Will decrease dose of hydromorphone by 50% down to 0,5 mg as needed.   2. AKI on stage IV CKDwith hypokalemia. Stable renal function with serum cr at 2,30, with K of 3,5, and serum bicarbonate at 31. Will continue furosemide once daily.   3. Chronic diastolic heart failure with acute exacerbation.Continue heart failure management  withcarvedilol, hydralazine and isosorbide. Tolerating well only once daily furosemide.    4. Abdominal aortic aneurysm. SP repair and stent to the common iliac artery.On  clopidogrel for antiplatelet therapy.  5. Stage 2 sacrum pressure ulcer (present on admission).Localwound care, o SNF at discharge.   6. HTN.On carvedilol, amlodipine, hydralazine and isosorbide. Blood pressure 148 to 155 mmHg.   7. Moderate calorie protein malnutrition.Onnutritional supplements with good toleration.   8. Anxiety/ tobacco abuse. Tolerating well alprazolam at night. Continue mirtazapine. Smoking cessation, continue nicotine patch.   DVT prophylaxis:enoxaparin Code Status:full Family Communication:No family at the bedside. Disposition Plan/ discharge barriers:pending placement   Body mass index is 22.45 kg/m. Malnutrition Type:  Nutrition Problem: Moderate Malnutrition Etiology: chronic illness(CKD IV, pancreatitis)   Malnutrition Characteristics:  Signs/Symptoms: severe muscle depletion, moderate muscle depletion, moderate fat depletion   Nutrition Interventions:  Interventions: Boost Breeze, Prostat  RN Pressure Injury Documentation: Pressure Injury 06/15/18 Stage I -  Intact skin with non-blanchable redness of a localized area usually over a bony prominence. moisture associated skin damage related to frequent diarrhea (Active)  06/15/18 1115   Location: Sacrum  Location Orientation: Posterior  Staging: Stage I -  Intact skin with non-blanchable redness of a localized area usually over a bony prominence.  Wound Description (Comments): moisture associated skin damage related to frequent diarrhea  Present on Admission:      Pressure Injury Stage II -  Partial thickness loss of dermis presenting as a shallow open ulcer with a red, pink wound bed without slough. (Active)      Location:   Location Orientation:   Staging: Stage II -  Partial thickness loss of dermis presenting as a shallow open ulcer with a red, pink wound bed without slough.  Wound Description (Comments):   Present on Admission:      Pressure Injury 07/08/18 Stage II  -  Partial thickness loss of  dermis presenting as a shallow open ulcer with a red, pink wound bed without slough. (Active)  07/08/18 1730   Location: Sacrum  Location Orientation: Posterior  Staging: Stage II -  Partial thickness loss of dermis presenting as a shallow open ulcer with a red, pink wound bed without slough.  Wound Description (Comments):   Present on Admission: Yes     Consultants:     Procedures:     Antimicrobials:       Subjective: Patient is feeling better but not back to baseline, continue to use IV hydromorphone for break through pain, no nausea or vomiting, and tolerating po well.   Objective: Vitals:   07/16/18 1355 07/16/18 2105 07/16/18 2110 07/17/18 0608  BP: (!) 157/58 (!) 148/50  (!) 155/51  Pulse: 65 (!) 59  (!) 57  Resp: 16 19  18   Temp: 98.9 F (37.2 C) 98.8 F (37.1 C)  98.7 F (37.1 C)  TempSrc: Oral Oral  Oral  SpO2: 94% 100% 100% 100%  Weight:      Height:       No intake or output data in the 24 hours ending 07/17/18 1157 Filed Weights   07/08/18 1134  Weight: 61.2 kg    Examination:   General: deconditioned  Neurology: Awake and alert, non focal  E ENT: mild pallor, no icterus, oral mucosa moist Cardiovascular: No JVD. S1-S2 present, rhythmic, no gallops, rubs, or murmurs. No lower extremity edema. Pulmonary: posistive breath sounds bilaterally, adequate air movement, no wheezing, rhonchi or rales. Gastrointestinal. Abdomen tender to deep palpation at the epigastrium, with no organomegaly, no rebound or guarding Skin. No rashes Musculoskeletal: no joint deformities     Data Reviewed: I have personally reviewed following labs and imaging studies  CBC: Recent Labs  Lab 07/11/18 0811 07/12/18 0438 07/13/18 0419 07/15/18 0404  WBC 12.1* 9.9 9.2 8.0  NEUTROABS  --   --  6.9 5.5  HGB 7.6* 8.3* 8.1* 7.3*  HCT 25.3* 27.7* 27.2* 24.6*  MCV 95.8 96.2 96.8 96.9  PLT 270 279 287 893   Basic Metabolic Panel: Recent  Labs  Lab 07/12/18 0438 07/13/18 0419 07/14/18 0442 07/15/18 0404 07/16/18 0444 07/17/18 0505  NA 138 141  --  142 140 140  K 4.9 4.4  --  2.8* 3.0* 3.5  CL 98 98  --  100 100 101  CO2 29 30  --  30 31 31   GLUCOSE 120* 133*  --  96 124* 116*  BUN 63* 59*  --  56* 56* 56*  CREATININE 2.71* 2.74* 2.74* 2.52* 2.39* 2.30*  CALCIUM 8.5* 8.8*  --  8.2* 8.2* 8.3*   GFR: Estimated Creatinine Clearance: 21.9 mL/min (A) (by C-G formula based on SCr of 2.3 mg/dL (H)). Liver Function Tests: Recent Labs  Lab 07/11/18 0811 07/12/18 0438  AST 23 23  ALT 10 9  ALKPHOS 46 43  BILITOT 0.7 0.9  PROT 5.4* 5.7*  ALBUMIN 2.5* 2.5*   No results for input(s): LIPASE, AMYLASE in the last 168 hours. No results for input(s): AMMONIA in the last 168 hours. Coagulation Profile: No results for input(s): INR, PROTIME in the last 168 hours. Cardiac Enzymes: No results for input(s): CKTOTAL, CKMB, CKMBINDEX, TROPONINI in the last 168 hours. BNP (last 3 results) No results for input(s): PROBNP in the last 8760 hours. HbA1C: No results for input(s): HGBA1C in the last 72 hours. CBG: No results for input(s): GLUCAP in the last 168 hours. Lipid Profile: No results  for input(s): CHOL, HDL, LDLCALC, TRIG, CHOLHDL, LDLDIRECT in the last 72 hours. Thyroid Function Tests: No results for input(s): TSH, T4TOTAL, FREET4, T3FREE, THYROIDAB in the last 72 hours. Anemia Panel: No results for input(s): VITAMINB12, FOLATE, FERRITIN, TIBC, IRON, RETICCTPCT in the last 72 hours.    Radiology Studies: I have reviewed all of the imaging during this hospital visit personally     Scheduled Meds: . acetaminophen  650 mg Oral Q6H  . ALPRAZolam  0.5 mg Oral QHS  . amLODipine  10 mg Oral Daily  . atorvastatin  40 mg Oral QPM  . carvedilol  12.5 mg Oral BID WC  . clopidogrel  75 mg Oral Q breakfast  . enoxaparin (LOVENOX) injection  30 mg Subcutaneous Q24H  . famotidine  20 mg Oral QHS  . feeding supplement  1  Container Oral TID BM  . feeding supplement (PRO-STAT SUGAR FREE 64)  30 mL Oral TID  . furosemide  40 mg Oral Daily  . gabapentin  200 mg Oral TID  . hydrALAZINE  25 mg Oral Q8H  . isosorbide mononitrate  15 mg Oral Daily  . mouth rinse  15 mL Mouth Rinse BID  . mirtazapine  7.5 mg Oral QHS  . mometasone-formoterol  2 puff Inhalation BID  . nicotine  14 mg Transdermal Daily  . oxyCODONE  10 mg Oral Q12H  . sucralfate  1 g Oral TID WC & HS   Continuous Infusions: . sodium chloride Stopped (07/13/18 1259)     LOS: 9 days        Aasia Peavler Gerome Apley, MD [facility] Pager [phone]

## 2018-07-17 NOTE — Progress Notes (Addendum)
CSW received a call from [facility] regarding patient's insurance authorization. [employer] Medicare denied patient for SNF, reportedly due to being able to walk 120 feet in PT evaluation.   CSW updated attending physician. Plan of care patient likely to discharge with Home Health tomorrow. CSW will update case management.  CSW called daughter, [name]. [name] aware of insurance auth denial and option of discharging home with home health.   Stephanie Acre, [place] Social Worker [phone]

## 2018-07-17 NOTE — Consult Note (Signed)
[facility] Nurse wound consult note Reason for Consult: full thickness drainage lesion to right buttock, medial area. Erythematous without induration, two circular openings within area of erythema consistent with herpetic or other infectious etiology, not pressure Wound type: not known, possible infectious Pressure Injury POA: NA Measurement: area of involvement measures 3cm x 2.5cm at the right medial buttock near gluteal cleft. Two circular lesions within measure <0.2cm with depth not known due to presence of yellow fibrinous material obscuring wound bed. Small amount of light yellow exudate. Wound bed:As described above Drainage (amount, consistency, odor) As described above Periwound:As described above Dressing procedure/placement/frequency: I will provide patient with a pressure redistribution chair pad for her comfort and provide Nursing with guidance for topical care including once daily application of a silver hydrofiber for absorption of exudate and donation of antimicrobial properties. Patient is in agreement with this POC.  [place] nursing team will not follow, but will remain available to this patient, the nursing and medical teams.  Please re-consult if needed. Thanks, Maudie Flakes, MSN, RN, [place], Arther Abbott  Pager# [phone]

## 2018-07-17 NOTE — Progress Notes (Signed)
Physical Therapy Treatment Patient Details Name: [name] MRN: [id number] DOB: 1951/06/06 Today's Date: 07/17/2018    History of Present Illness Pt is a 67 year old female admitted with RUQ pain.  PMH:  gallstone pancreatitis, s/p cholecystectomy, CKD, COPD, CAD, DM and HTN    PT Comments    The patient reports feeling better. Reports that pain medication makes her feel a little foggy but not dizzy . Patient was able to ambulate  Without any problems due to medication. Patient plans SNF for rehab .  Follow Up Recommendations  SNF     Equipment Recommendations  None recommended by PT    Recommendations for Other Services       Precautions / Restrictions Precautions Precautions: Fall    Mobility  Bed Mobility               General bed mobility comments: sitting on bed edge  Transfers   Equipment used: Rolling walker (2 wheeled) Transfers: Sit to/from Stand Sit to Stand: Supervision Stand pivot transfers: Min assist       General transfer comment: verbal cues for hand placement  Ambulation/Gait Ambulation/Gait assistance: Min guard Gait Distance (Feet): 120 Feet Assistive device: Rolling walker (2 wheeled) Gait Pattern/deviations: Step-through pattern Gait velocity: slowed   General Gait Details: patient ambulated with smoothe gait, no balance losses.   Stairs             Wheelchair Mobility    Modified Rankin (Stroke Patients Only)       Balance Overall balance assessment: Needs assistance Sitting-balance support: Feet supported;Bilateral upper extremity supported Sitting balance-Leahy Scale: Good     Standing balance support: No upper extremity supported;During functional activity Standing balance-Leahy Scale: Fair                              Cognition Arousal/Alertness: Awake/alert Behavior During Therapy: WFL for tasks assessed/performed Overall Cognitive Status: Within Functional Limits for tasks assessed                                         Exercises      General Comments        Pertinent Vitals/Pain Pain Assessment: No/denies pain    Home Living                      Prior Function            PT Goals (current goals can now be found in the care plan section) Progress towards PT goals: Progressing toward goals    Frequency    Min 2X/week      PT Plan Current plan remains appropriate    Co-evaluation              AM-PAC PT "6 Clicks" Daily Activity  Outcome Measure  Difficulty turning over in bed (including adjusting bedclothes, sheets and blankets)?: A Little Difficulty moving from lying on back to sitting on the side of the bed? : A Little Difficulty sitting down on and standing up from a chair with arms (e.g., wheelchair, bedside commode, etc,.)?: A Little Help needed moving to and from a bed to chair (including a wheelchair)?: A Little Help needed walking in hospital room?: A Little Help needed climbing 3-5 steps with a railing? : A Lot 6 Click Score: 17  End of Session   Activity Tolerance: Patient tolerated treatment well Patient left: in chair;with call bell/phone within reach;with family/visitor present Nurse Communication: Mobility status PT Visit Diagnosis: Muscle weakness (generalized) (M62.81)     Time: 2840-6986 PT Time Calculation (min) (ACUTE ONLY): 38 min  Charges:  $Gait Training: 23-37 mins                     Tresa Endo PT Acute Rehabilitation Services Pager [phone] Office [phone]    Claretha Cooper 07/17/2018, 1:05 PM

## 2018-07-17 NOTE — Progress Notes (Signed)
Spoke with pt's daughter [name] [phone], concerning [place] at discharge. [name] states that she will appeal this discharge/[employer] Medicare Advantage, because her mother can not do anything for herself and she is having diarrhea. [name] continued with that pt and family use [facility]. Will follow for discharge or appeal.

## 2018-07-17 NOTE — Progress Notes (Addendum)
CSW following patient for discharge needs. CSW aware patient anticipates discharge to [facility], will need insurance authorization.   CSW contacted [employer] to start insurance authorization anticipating discharge tomorrow.   Patient will need PT evaluation today for insurance auth.  Stephanie Acre, [place] Social Worker [phone]

## 2018-07-18 ENCOUNTER — Telehealth: Payer: Self-pay | Admitting: Gastroenterology

## 2018-07-18 LAB — CREATININE, SERUM
CREATININE: 2.31 mg/dL — AB (ref 0.44–1.00)
GFR calc Af Amer: 24 mL/min — ABNORMAL LOW (ref >60)
GFR, EST NON AFRICAN AMERICAN: 21 mL/min — AB (ref >60)

## 2018-07-18 MED ORDER — LOPERAMIDE HCL 2 MG PO CAPS
2.0000 mg | ORAL_CAPSULE | ORAL | Status: DC | PRN
  Administered 2018-07-18 – 2018-07-19 (×4): 2 mg via ORAL
  Filled 2018-07-18 (×4): qty 1

## 2018-07-18 NOTE — Plan of Care (Signed)
Pt needed Imodium this shift for frequent loose stools.

## 2018-07-18 NOTE — Progress Notes (Signed)
Occupational Therapy Treatment Patient Details Name: [name] MRN: [id number] DOB: 04/03/1951 Today's Date: 07/18/2018    History of present illness Pt is a 67 year old female admitted with RUQ pain.  PMH:  gallstone pancreatitis, s/p cholecystectomy, CKD, COPD, CAD, DM and HTN   OT comments  Pt is making progress with OT.  Recommend SNF as pt is alone during the day remains a little shaky (but no LOB).  She will have to prepare lunch herself. Recommend HHOT, if pt does return home, to get her back to an independent level.     Follow Up Recommendations  SNF(per notes:  pt doesn't qualify; if home, HHOT)    Equipment Recommendations  None recommended by OT    Recommendations for Other Services      Precautions / Restrictions Precautions Precautions: Fall Restrictions Weight Bearing Restrictions: No       Mobility Bed Mobility         Supine to sit: Min guard Sit to supine: Min guard   General bed mobility comments: placed hands on thigh to counterbalance getting up  Transfers   Equipment used: Rolling walker (2 wheeled)   Sit to Stand: Supervision         General transfer comment: for safety    Balance                                           ADL either performed or assessed with clinical judgement   ADL       Grooming: Min guard;Standing;Oral care       Lower Body Bathing: Minimal assistance;Sit to/from stand       Lower Body Dressing: Minimal assistance;Sit to/from stand   Toilet Transfer: Min guard;Ambulation;RW;BSC   Toileting- Water quality scientist and Hygiene: Min guard;Sit to/from stand   Tub/ Shower Transfer: Min guard;Walk-in shower;Ambulation;Rolling walker;3 in 1(simulated)     General ADL Comments: daughters presentf     Vision       Perception     Praxis      Cognition Arousal/Alertness: Awake/alert Behavior During Therapy: WFL for tasks assessed/performed Overall Cognitive Status: Within  Functional Limits for tasks assessed                                          Exercises     Shoulder Instructions       General Comments daughters present    Pertinent Vitals/ Pain       Pain Score: 7  Pain Location: RUQ Pain Descriptors / Indicators: Aching Pain Intervention(s): Limited activity within patient's tolerance;Monitored during session;Repositioned;Patient requesting pain meds-RN notified  Home Living                                          Prior Functioning/Environment              Frequency  Min 2X/week        Progress Toward Goals  OT Goals(current goals can now be found in the care plan section)  Progress towards OT goals: Progressing toward goals     Plan      Co-evaluation  AM-PAC PT "6 Clicks" Daily Activity     Outcome Measure   Help from another person eating meals?: None Help from another person taking care of personal grooming?: A Little Help from another person toileting, which includes using toliet, bedpan, or urinal?: A Little Help from another person bathing (including washing, rinsing, drying)?: A Little Help from another person to put on and taking off regular upper body clothing?: A Little Help from another person to put on and taking off regular lower body clothing?: A Little 6 Click Score: 19    End of Session    OT Visit Diagnosis: Muscle weakness (generalized) (M62.81)   Activity Tolerance Patient limited by pain   Patient Left in bed;with call bell/phone within reach;with family/visitor present   Nurse Communication          Time: 5732-2025 OT Time Calculation (min): 28 min  Charges: OT General Charges $OT Visit: 1 Visit OT Treatments $Self Care/Home Management : 23-37 mins  [name], OTR/L Acute Rehabilitation Services [phone] [facility] pager [phone] office 07/18/2018   Francisco 07/18/2018, 12:24 PM

## 2018-07-18 NOTE — Telephone Encounter (Signed)
The pt's daughter was advised to keep the appt for Monday.  She agreed and will call if the pt happens to still be admitted over the weekend.

## 2018-07-18 NOTE — Progress Notes (Addendum)
CSW following patient for discharge needs.   CSW spoke with patient's physician. Attending physician receptive to ordering new PT evaluation in an effort to apply for insurance re-authorization for SNF.  CSW reached out to [facility], where patient and family hoped patient to discharge to. Unfortunately, [facility] does not have bed available today and does not expect to have beds in the next few days.  CSW updated daughter Donato Schultz spoke with her sister Almyra Free. CSW inquired if patient or patient's family had a second or third SNF preference and reiterated that insurance authorization will once again be determined by patient's insurance provider. [name] voiced understanding.  [name] reports family doesn't have a second preference for SNF, and explained that the patient's insurance will only be accepted by a small number of facilities. CSW granted permission to fax out ref[place]. CSW to follow up with patient and family with any potential bed offers.   4:14pm Spoke to daughter, [name] [phone]). CSW gave daughters SNF bed offers via telephone, patient and family have selected [facility]. Patient hope that [place] will run insurance authorization and be approved for SNF, but understand that the patient's insurance may deny SNF again at which point the patient will discharge home with home health.  CSW contacted [facility] and faxed over [facility] from 07/17/18  Stephanie Acre, [place] Social Worker [phone]

## 2018-07-18 NOTE — Telephone Encounter (Signed)
Dr Ardis Hughs it looks like you consulted this pt, can we cancel the appt for Monday with Dr Rush Landmark?

## 2018-07-18 NOTE — Progress Notes (Signed)
Physical Therapy Treatment Patient Details Name: [name] MRN: [id number] DOB: 1951/06/25 Today's Date: 07/18/2018    History of Present Illness Pt is a 67 year old female admitted with RUQ pain.  PMH:  gallstone pancreatitis, s/p cholecystectomy, CKD, COPD, CAD, DM and HTN    PT Comments    The  Patient is slightly sluggish this visit. Patient had been premedciated. Concern is  For patient being home alone and managing all aspects of care, safety. Patient has no been iher home for quite some time. Patient  Was admitted from SNF where she was for rehab. Continue PT.  Follow Up Recommendations  SNF     Equipment Recommendations  None recommended by PT    Recommendations for Other Services       Precautions / Restrictions Precautions Precautions: Fall Precaution Comments: watch SPO2/HR Restrictions Weight Bearing Restrictions: No    Mobility  Bed Mobility         Supine to sit: Min guard Sit to supine: Min guard   General bed mobility comments: placed hands on thigh to counterbalance getting up  Transfers Overall transfer level: Needs assistance Equipment used: 4-wheeled walker;None Transfers: Sit to/from Stand Sit to Stand: Supervision;Min guard         General transfer comment: for safety, patient did require use of rail from low toilet, required steady assist from 4 wheeled RW.   Ambulation/Gait Ambulation/Gait assistance: Min guard;Min assist Gait Distance (Feet): 60 Feet(x 2) Assistive device: 4-wheeled walker Gait Pattern/deviations: Step-through pattern Gait velocity: slowed   General Gait Details: gait is slow. Patient stopped, turned and sat onto rollator, stood up and turned again with min to minguard assistance.   Stairs             Wheelchair Mobility    Modified Rankin (Stroke Patients Only)       Balance           Standing balance support: Single extremity supported   Standing balance comment: reliant on  1  UE  support  when not using Rollator. " cruised" on wall, door and bed when not using the rollator.                            Cognition Arousal/Alertness: Awake/alert Behavior During Therapy: WFL for tasks assessed/performed Overall Cognitive Status: Within Functional Limits for tasks assessed                                        Exercises      General Comments General comments (skin integrity, edema, etc.): daughters present      Pertinent Vitals/Pain Pain Score: 7  Faces Pain Scale: Hurts little more Pain Location: RUQ and buttock sore Pain Descriptors / Indicators: Aching Pain Intervention(s): Monitored during session;Premedicated before session    Home Living                      Prior Function            PT Goals (current goals can now be found in the care plan section) Progress towards PT goals: Progressing toward goals    Frequency    Min 2X/week      PT Plan      Co-evaluation              AM-PAC PT "6 Clicks"  Daily Activity  Outcome Measure  Difficulty turning over in bed (including adjusting bedclothes, sheets and blankets)?: A Little Difficulty moving from lying on back to sitting on the side of the bed? : A Little Difficulty sitting down on and standing up from a chair with arms (e.g., wheelchair, bedside commode, etc,.)?: A Little Help needed moving to and from a bed to chair (including a wheelchair)?: A Little Help needed walking in hospital room?: A Little Help needed climbing 3-5 steps with a railing? : A Lot 6 Click Score: 17    End of Session   Activity Tolerance: Patient limited by fatigue Patient left: in bed;with call bell/phone within reach;with bed alarm set;with family/visitor present Nurse Communication: Mobility status PT Visit Diagnosis: Muscle weakness (generalized) (M62.81)     Time: 3474-2595 PT Time Calculation (min) (ACUTE ONLY): 37 min  Charges:  $Gait Training: 8-22  mins $Self Care/Home Management: [place] Pager [phone] Office [phone]    Claretha Cooper 07/18/2018, 2:15 PM

## 2018-07-18 NOTE — Telephone Encounter (Signed)
She should keep that appt next week with Dr. Jerilynn Mages

## 2018-07-18 NOTE — Progress Notes (Signed)
PROGRESS NOTE    CRISTY COLMENARES  JGG:[id number] DOB: Mar 31, 1951 DOA: 07/08/2018 PCP: Bernerd Limbo, MD    Brief Narrative:  67 year old female who presented with right upper quadrant abdominal pain. She does have significant past medical history for gallstone pancreatitis status post cholecystectomy, chronic kidney disease, COPD, coronary disease, type 2 diabetes mellitus and hypertension. She developed sharp right upper quadrant abdominal pain, associated with nausea. On her initial physical examination blood pressure 160/50,her lungs are clear to auscultation bilaterally, heart S1-S2 present, irregular rhythm, abdomen tender to in the right upper quadrant, no lower extremity edema.  Patient was admitted to the hospital with working diagnosis of recurrent pancreatitis   Assessment & Plan:   Active Problems:   RUQ pain   Intraabdominal fluid collection   Cystic duct calculus   Fluid collection at surgical site   Stage II pressure ulcer (HCC)   Malnutrition of moderate degree  1. Acute pancreatitis complicated with pancreatic fluid collection.No bilary leakonHIDA scan, positive cystic duct dilatation with positive stone. Not candidate for endoscopic procedure, and no indication for surgery.Continue to improvepain control with long acting opiates oxycodone 10 mg bid, oxycodoneIR 10 mgas needed q 4 hours. Continue scheduled acetaminophen. Hydromorphone dose has been reduced with good toleration, continue antiacid therapy withsucralfate and famotidine.   2. AKI on stage IV CKDwith hypokalemia.Renal function stable 2,3. Improved po intake, will continue low dose of furosemide.  3. Chronic diastolic heart failure with acute exacerbation. Patient is now euvolemic, will continue heart failure management with carvedilol, hydralazine and isosorbide. Continue daily furosemide.   4. Abdominal aortic aneurysm. SP repair and stent to the common iliac artery.Continue with clopidogrel  for antiplatelet therapy.  5. Stage 2 sacrum pressure ulcer (present on admission).Continue localwound care. Out of bed as tolerated to chair and continue working with physical therapy.   6. HTN.Continue carvedilol, amlodipine, hydralazine and isosorbide. Blood pressure has remained well controlled.  7. Moderate calorie protein malnutrition.Continue nutritional supplements.   8. Anxiety/ tobacco abuse. Continue alprazolam at night and daily mirtazapine. Continue smoking cessation, on nicotine patch.   DVT prophylaxis:enoxaparin Code Status:full Family Communication:No family at the bedside. Disposition Plan/ discharge barriers:pending placement in SNF.   Body mass index is 22.45 kg/m. Malnutrition Type:  Nutrition Problem: Moderate Malnutrition Etiology: chronic illness(CKD IV, pancreatitis)   Malnutrition Characteristics:  Signs/Symptoms: severe muscle depletion, moderate muscle depletion, moderate fat depletion   Nutrition Interventions:  Interventions: Boost Breeze, Prostat  RN Pressure Injury Documentation: Pressure Injury 06/15/18 Stage I -  Intact skin with non-blanchable redness of a localized area usually over a bony prominence. moisture associated skin damage related to frequent diarrhea (Active)  06/15/18 1115   Location: Sacrum  Location Orientation: Posterior  Staging: Stage I -  Intact skin with non-blanchable redness of a localized area usually over a bony prominence.  Wound Description (Comments): moisture associated skin damage related to frequent diarrhea  Present on Admission:      Pressure Injury 07/17/18 Unstageable - Full thickness tissue loss in which the base of the ulcer is covered by slough (yellow, tan, gray, green or brown) and/or eschar (tan, brown or black) in the wound bed. (Active)  07/17/18 1320   Location:   Location Orientation:   Staging: Unstageable - Full thickness tissue loss in which the base of the ulcer is  covered by slough (yellow, tan, gray, green or brown) and/or eschar (tan, brown or black) in the wound bed.  Wound Description (Comments):   Present on Admission:  Consultants:   GI  Procedures:     Antimicrobials:       Subjective: Patient reports continue to improve abdominal pain, no nausea or vomiting, continue to need IV hydromorphone for breakthrough pain.   Objective: Vitals:   07/17/18 1308 07/17/18 2208 07/18/18 0525 07/18/18 0939  BP: (!) 150/51  (!) 147/51   Pulse: (!) 59  (!) 53   Resp: 12  12   Temp: 98.1 F (36.7 C)  98 F (36.7 C)   TempSrc: Oral  Oral   SpO2: 92% 98% 97% 97%  Weight:      Height:       No intake or output data in the 24 hours ending 07/18/18 1220 Filed Weights   07/08/18 1134  Weight: 61.2 kg    Examination:   General: deconditioned  Neurology: Awake and alert, non focal  E ENT: positive pallor, no icterus, oral mucosa moist Cardiovascular: No JVD. S1-S2 present, rhythmic, no gallops, rubs, or murmurs. No lower extremity edema. Pulmonary: vesicular breath sounds bilaterally, adequate air movement, no wheezing, rhonchi or rales. Gastrointestinal. Abdomen with no organomegaly, no rebound or guarding. Mild tender to deep palpation.  Skin. No rashes Musculoskeletal: no joint deformities     Data Reviewed: I have personally reviewed following labs and imaging studies  CBC: Recent Labs  Lab 07/12/18 0438 07/13/18 0419 07/15/18 0404  WBC 9.9 9.2 8.0  NEUTROABS  --  6.9 5.5  HGB 8.3* 8.1* 7.3*  HCT 27.7* 27.2* 24.6*  MCV 96.2 96.8 96.9  PLT 279 287 268   Basic Metabolic Panel: Recent Labs  Lab 07/12/18 0438 07/13/18 0419 07/14/18 0442 07/15/18 0404 07/16/18 0444 07/17/18 0505 07/18/18 0520  NA 138 141  --  142 140 140  --   K 4.9 4.4  --  2.8* 3.0* 3.5  --   CL 98 98  --  100 100 101  --   CO2 29 30  --  30 31 31   --   GLUCOSE 120* 133*  --  96 124* 116*  --   BUN 63* 59*  --  56* 56* 56*  --     CREATININE 2.71* 2.74* 2.74* 2.52* 2.39* 2.30* 2.31*  CALCIUM 8.5* 8.8*  --  8.2* 8.2* 8.3*  --    GFR: Estimated Creatinine Clearance: 21.8 mL/min (A) (by C-G formula based on SCr of 2.31 mg/dL (H)). Liver Function Tests: Recent Labs  Lab 07/12/18 0438  AST 23  ALT 9  ALKPHOS 43  BILITOT 0.9  PROT 5.7*  ALBUMIN 2.5*   No results for input(s): LIPASE, AMYLASE in the last 168 hours. No results for input(s): AMMONIA in the last 168 hours. Coagulation Profile: No results for input(s): INR, PROTIME in the last 168 hours. Cardiac Enzymes: No results for input(s): CKTOTAL, CKMB, CKMBINDEX, TROPONINI in the last 168 hours. BNP (last 3 results) No results for input(s): PROBNP in the last 8760 hours. HbA1C: No results for input(s): HGBA1C in the last 72 hours. CBG: No results for input(s): GLUCAP in the last 168 hours. Lipid Profile: No results for input(s): CHOL, HDL, LDLCALC, TRIG, CHOLHDL, LDLDIRECT in the last 72 hours. Thyroid Function Tests: No results for input(s): TSH, T4TOTAL, FREET4, T3FREE, THYROIDAB in the last 72 hours. Anemia Panel: No results for input(s): VITAMINB12, FOLATE, FERRITIN, TIBC, IRON, RETICCTPCT in the last 72 hours.    Radiology Studies: I have reviewed all of the imaging during this hospital visit personally     Scheduled Meds: .  acetaminophen  650 mg Oral Q6H  . ALPRAZolam  0.5 mg Oral QHS  . amLODipine  10 mg Oral Daily  . atorvastatin  40 mg Oral QPM  . carvedilol  12.5 mg Oral BID WC  . clopidogrel  75 mg Oral Q breakfast  . enoxaparin (LOVENOX) injection  30 mg Subcutaneous Q24H  . famotidine  20 mg Oral QHS  . feeding supplement  1 Container Oral TID BM  . feeding supplement (PRO-STAT SUGAR FREE 64)  30 mL Oral TID  . furosemide  40 mg Oral Daily  . gabapentin  200 mg Oral TID  . hydrALAZINE  25 mg Oral Q8H  . isosorbide mononitrate  15 mg Oral Daily  . mouth rinse  15 mL Mouth Rinse BID  . mirtazapine  7.5 mg Oral QHS  .  mometasone-formoterol  2 puff Inhalation BID  . nicotine  14 mg Transdermal Daily  . oxyCODONE  10 mg Oral Q12H  . sucralfate  1 g Oral TID WC & HS   Continuous Infusions: . sodium chloride Stopped (07/13/18 1259)     LOS: 10 days        Charmagne Buhl Gerome Apley, MD [facility] Pager [phone]

## 2018-07-19 DIAGNOSIS — N179 Acute kidney failure, unspecified: Secondary | ICD-10-CM

## 2018-07-19 DIAGNOSIS — I509 Heart failure, unspecified: Secondary | ICD-10-CM

## 2018-07-19 DIAGNOSIS — K859 Acute pancreatitis without necrosis or infection, unspecified: Secondary | ICD-10-CM

## 2018-07-19 DIAGNOSIS — E440 Moderate protein-calorie malnutrition: Secondary | ICD-10-CM

## 2018-07-19 LAB — CREATININE, SERUM
Creatinine, Ser: 2.34 mg/dL — ABNORMAL HIGH (ref 0.44–1.00)
GFR calc Af Amer: 24 mL/min — ABNORMAL LOW (ref >60)
GFR calc non Af Amer: 21 mL/min — ABNORMAL LOW (ref >60)

## 2018-07-19 MED ORDER — OXYCODONE HCL ER 10 MG PO T12A: 10.0000 mg | EXTENDED_RELEASE_TABLET | Freq: Two times a day (BID) | ORAL | 0 refills | Status: DC

## 2018-07-19 MED ORDER — FERROUS SULFATE 325 (65 FE) MG PO TABS: 325.0000 mg | ORAL_TABLET | Freq: Every day | ORAL | 0 refills | Status: DC

## 2018-07-19 MED ORDER — OXYCODONE HCL 10 MG PO TABS: 10.0000 mg | ORAL_TABLET | ORAL | 0 refills | Status: DC | PRN

## 2018-07-19 MED ORDER — CLONAZEPAM 0.5 MG PO TABS: 0.2500 mg | ORAL_TABLET | Freq: Every evening | ORAL | 0 refills | Status: DC | PRN

## 2018-07-19 NOTE — Care Management Important Message (Signed)
Important Message  Patient Details  Name: [name] MRN: [id number] Date of Birth: [birth year]   Medicare Important Message Given:  Yes    Kerin Salen 07/19/2018, 11:00 AMImportant Message  Patient Details  Name: [name] MRN: [id number] Date of Birth: 1951-04-08   Medicare Important Message Given:  Yes    Kerin Salen 07/19/2018, 11:00 AM

## 2018-07-19 NOTE — Progress Notes (Signed)
Spoke with pt and daughter Julie(via pt's cell), concerning denial with [employer]. Daughter Almyra Free was a little upset and stated we did not have any notice, day by day things as changing. Explained to Almyra Free that we have been working on discharge for her mother since Monday and have sent re-applied for SNF for pt three times and if was denied. Almyra Free understood. Pt discharge home with [facility] discharge today. Pt is stable, alert and oriented x3.

## 2018-07-19 NOTE — Discharge Summary (Signed)
Discharge Summary  [name] CBS:[id number] DOB: 09-14-1950  PCP: Bernerd Limbo, MD  Admit date: 07/08/2018 Discharge date: 07/19/2018  Time spent: 72mins, more than 50% time spent on coordination of care.  Recommendations for Outpatient Follow-up:  1. F/u with PCP within a week  for hospital discharge follow up, repeat cbc/bmp at follow up. pcp for pain management 2. F/u with GI on 11/18 3. F/u with general surgery on 12/11 4. Home health arranged   Discharge Diagnoses:  Active Hospital Problems   Diagnosis Date Noted  . Malnutrition of moderate degree 07/13/2018  . Stage II pressure ulcer ([place]) 07/12/2018  . Fluid collection at surgical site   . Intraabdominal fluid collection   . Cystic duct calculus   . RUQ pain 07/08/2018    Resolved Hospital Problems  No resolved problems to display.    Discharge Condition: stable  Diet recommendation: low fat diet  Filed Weights   07/08/18 1134  Weight: 61.2 kg    History of present illness: (per admitting MD Dr Lonny Prude)  PCP: Bernerd Limbo, MD Patient coming from: SNF  Chief Complaint: RUQ abdominal pain  HPI: [name] is a 67 y.o. female with medical history significant of AAA, acute cholecystitis s/p cholecystectomy, gallstone pancreatitis, CKD, COPD, CAD, DM, hypertension, NSTEMI, pancreatitis with pseudocyst.  Patient presented to the ED secondary to sharp right upper quadrant pain with no radiation.  She reports that this is similar to her previous presentations to the hospital about a month ago where she had acute cholecystitis and pancreatitis.  She has some nausea  ED Course: Vitals: Afebrile, normal pulse and respirations. BP 160/50s, 2 L via nasal cannula. Labs: Potassium of 3.2, glucose of 152, creatinine of 2.69, BUN of 70, lipase of 305, total bilirubin of 1.3, WBC of 12, hemoglobin of 10.6, platelets 441 Imaging: Chest and abdominal x-ray significant for worsening pulmonary edema and vascular  congestion Medications/Course: IV fluid bolus given the emergency department in addition to 0.5 mg of Dilaudid   Hospital Course:  Active Problems:   RUQ pain   Intraabdominal fluid collection   Cystic duct calculus   Fluid collection at surgical site   Stage II pressure ulcer ([facility])   Malnutrition of moderate degree  Acute pancreatitis complicated with pancreatic fluid collection.No bilary leakonHIDA scan, positive cystic duct dilatation with positive stone.  -GI and general surgery consulted, Not candidate for endoscopic procedure, and no indication for surgery as of now. -pain control with long acting opiates oxycodone 10 mg bid, oxycodoneIR 10 mgas needed q 4 hours. Acetaminophen. -she reports dilaudid make her sleepy, but tolerating oxycodone. -she is feeling better, tolerating diet, she is declined  By snf, she is to discharged home with home health, close follow up with gi and general surgery. 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 Cr peaked at 2.74, cr back down to 2.34 at discharge Resume home meds lasix and spironolactone pcp to repeat bmp and follow renal function   Chronic diastolic heart failure with acute exacerbation.  Last lvef 60^ from 05/2018 cxr on presentation with pulmonayr vascular congestion with new mild interstitial pulmonary edema She received lasix, improving, lung clear, bilateral lower extremity edema improved -she is discharged on home dose lasix and spironolactone  HTN.Continuecarvedilol, amlodipine, hydralazine and isosorbide. Blood pressure has remained well controlled.  H/o CAD s/p CABG, DES placement in 2016, chronic RBBB. PAF not on anticoagulation  sinus bradycardia in the hospital, heart rate range from hight 50's to  low 60's in the hospital Denies chest pain Continue plavix/statin/betablocker  Abdominal aortic aneurysm. SP repair and stent to the common iliac artery.Continue with clopidogrel forantiplatelet  therapy.  PAD(bilateral renal artery stents), on plavix  Stage 2 sacrum pressure ulcer (present on admission).Continue localwound care. Out of bed as tolerated to chair and continue working with physical therapy.   RN Pressure Injury Documentation: Pressure Injury 06/15/18 Stage I -  Intact skin with non-blanchable redness of a localized area usually over a bony prominence. moisture associated skin damage related to frequent diarrhea (Active)  06/15/18 1115   Location: Sacrum  Location Orientation: Posterior  Staging: Stage I -  Intact skin with non-blanchable redness of a localized area usually over a bony prominence.  Wound Description (Comments): moisture associated skin damage related to frequent diarrhea  Present on Admission:      Pressure Injury 07/17/18 Unstageable - Full thickness tissue loss in which the base of the ulcer is covered by slough (yellow, tan, Amy, green or brown) and/or eschar (tan, brown or black) in the wound bed. (Active)  07/17/18 1320   Location:   Location Orientation:   Staging: Unstageable - Full thickness tissue loss in which the base of the ulcer is covered by slough (yellow, tan, Amy, green or brown) and/or eschar (tan, brown or black) in the wound bed.  Wound Description (Comments):   Present on Admission:      Moderate calorie protein malnutrition.Continue nutritional supplements.  Malnutrition Type:  Nutrition Problem: Moderate Malnutrition Etiology: chronic illness(CKD IV, pancreatitis)   Malnutrition Characteristics:  Signs/Symptoms: moderate muscle depletion, moderate fat depletion   Nutrition Interventions:  Interventions: Boost Breeze, Prostat     Smoker, COPD, Report on home o2 at night No wheezing, no cough  Anxiety/ tobacco abuse. D/c xanax, she states does not use is regularly at home, she agreed to try prn klonopin  Continue smoking cessation, on nicotine patch.  DVT prophylaxis while in the  hospital:enoxaparin Code Status:full Family: daughter updated at bedside  Procedures:  none  Consultations:  GI  General surgery  Discharge Exam: BP (!) 172/52 (BP Location: Right Arm)   Pulse 62   Temp 98.6 F (37 C) (Oral)   Resp 16   Ht 5\' 5"  (1.651 m)   Wt 61.2 kg   SpO2 97%   BMI 22.45 kg/m   General: NAD Cardiovascular: RRR Respiratory: CTABL Extremity: trace bilateral pedal edema  Discharge Instructions You were cared for by a hospitalist during your hospital stay. If you have any questions about your discharge medications or the care you received while you were in the hospital after you are discharged, you can call the unit and asked to speak with the hospitalist on call if the hospitalist that took care of you is not available. Once you are discharged, your primary care physician will handle any further medical issues. Please note that NO REFILLS for any discharge medications will be authorized once you are discharged, as it is imperative that you return to your primary care physician (or establish a relationship with a primary care physician if you do not have one) for your aftercare needs so that they can reassess your need for medications and monitor your lab values.  Discharge Instructions    Diet - low sodium heart healthy   Complete by:  As directed    Low fat diet   Increase activity slowly   Complete by:  As directed      Allergies as of 07/19/2018  Reactions   Isosorbide Other (See Comments)   Can only tolerate in low doses   Isosorbide Nitrate Other (See Comments)   Can only tolerate in low doses   Oxycodone-acetaminophen Itching   Tolerates Hydrocodone.  Also tolerated oxycodone 07/13/18 admission.     Codeine Itching   Contrast Media [iodinated Diagnostic Agents] Itching, Other (See Comments)   Itching of feet   Ioxaglate Itching, Other (See Comments)   Itching of feet   Metrizamide Itching, Other (See Comments)   Itching of feet    Percocet [oxycodone-acetaminophen] Itching, Other (See Comments)   Tolerates Hydrocodone      Medication List    STOP taking these medications   ALPRAZolam 0.5 MG tablet Commonly known as:  XANAX   docusate sodium 100 MG capsule Commonly known as:  COLACE   famotidine 20 MG tablet Commonly known as:  PEPCID   mirtazapine 7.5 MG tablet Commonly known as:  REMERON   nicotine 14 mg/24hr patch Commonly known as:  NICODERM CQ - dosed in mg/24 hours   tamsulosin 0.4 MG Caps capsule Commonly known as:  FLOMAX     TAKE these medications   acetaminophen 325 MG tablet Commonly known as:  TYLENOL Take 325 mg by mouth every 6 (six) hours as needed (for pain).   albuterol 108 (90 Base) MCG/ACT inhaler Commonly known as:  PROVENTIL HFA;VENTOLIN HFA Inhale 2 puffs into the lungs every 4 (four) hours as needed for wheezing or shortness of breath. ProAir What changed:  additional instructions   allopurinol 100 MG tablet Commonly known as:  ZYLOPRIM Take 100 mg by mouth daily.   amLODipine 10 MG tablet Commonly known as:  NORVASC Take 10 mg by mouth daily.   atorvastatin 40 MG tablet Commonly known as:  LIPITOR Take 40 mg by mouth every evening.   budesonide-formoterol 160-4.5 MCG/ACT inhaler Commonly known as:  SYMBICORT Inhale 2 puffs into the lungs 2 (two) times daily as needed (for flares).   carvedilol 12.5 MG tablet Commonly known as:  COREG Take 1 tablet (12.5 mg total) by mouth 2 (two) times daily with a meal.   clonazePAM 0.5 MG tablet Commonly known as:  KLONOPIN Take 0.5 tablets (0.25 mg total) by mouth at bedtime as needed for anxiety (for insomnia).   clopidogrel 75 MG tablet Commonly known as:  PLAVIX Take 1 tablet (75 mg total) by mouth daily with breakfast.   cyanocobalamin 100 MCG tablet Take 1 tablet (100 mcg total) by mouth daily.   DULoxetine 60 MG capsule Commonly known as:  CYMBALTA Take 60 mg by mouth daily.   feeding supplement (PRO-STAT  SUGAR FREE 64) Liqd Take 30 mLs by mouth 3 (three) times daily with meals.   feeding supplement Liqd Commonly known as:  BOOST / RESOURCE BREEZE Take 1 Container by mouth 2 (two) times daily.   fenofibrate 160 MG tablet Take 1 tablet (160 mg total) by mouth daily. What changed:  when to take this   ferrous sulfate 325 (65 FE) MG tablet Take 1 tablet (325 mg total) by mouth daily with breakfast. What changed:  when to take this   furosemide 40 MG tablet Commonly known as:  LASIX Take 1 tablet (40 mg total) by mouth 2 (two) times daily.   gabapentin 300 MG capsule Commonly known as:  NEURONTIN TAKE 1 CAPSULE(300 MG) BY MOUTH TWICE DAILY What changed:  See the new instructions.   hydrALAZINE 25 MG tablet Commonly known as:  APRESOLINE Take 1 tablet (  25 mg total) by mouth every 8 (eight) hours.   HYDROcodone-acetaminophen 5-325 MG tablet Commonly known as:  NORCO/VICODIN Take 1 tablet by mouth every 6 (six) hours as needed for moderate pain.   isosorbide mononitrate 30 MG 24 hr tablet Commonly known as:  IMDUR Take 0.5 tablets (15 mg total) by mouth daily.   Oxycodone HCl 10 MG Tabs Take 1 tablet (10 mg total) by mouth every 4 (four) hours as needed for moderate pain or severe pain.   oxyCODONE 10 mg 12 hr tablet Commonly known as:  OXYCONTIN Take 1 tablet (10 mg total) by mouth every 12 (twelve) hours.   pantoprazole 40 MG tablet Commonly known as:  PROTONIX Take 1 tablet (40 mg total) by mouth 2 (two) times daily.   polyethylene glycol packet Commonly known as:  MIRALAX / GLYCOLAX Take 17 g by mouth daily as needed for mild constipation.   spironolactone 25 MG tablet Commonly known as:  ALDACTONE Take 1 tablet (25 mg total) by mouth daily.   Vitamin D3 125 MCG (5000 UT) Tabs Take 5,000 Units by mouth daily.      Allergies  Allergen Reactions  . Isosorbide Other (See Comments)    Can only tolerate in low doses  . Isosorbide Nitrate Other (See Comments)     Can only tolerate in low doses  . Oxycodone-Acetaminophen Itching    Tolerates Hydrocodone.  Also tolerated oxycodone 07/13/18 admission.    . Codeine Itching  . Contrast Media [Iodinated Diagnostic Agents] Itching and Other (See Comments)    Itching of feet  . Ioxaglate Itching and Other (See Comments)    Itching of feet  . Metrizamide Itching and Other (See Comments)    Itching of feet  . Percocet [Oxycodone-Acetaminophen] Itching and Other (See Comments)    Tolerates Hydrocodone    Contact information for follow-up providers    Rolm Bookbinder, MD. Go on 08/16/2018.   Specialty:  General Surgery Why:  Appointment scheduled for 9:50 AM. Please arrive 15 min prior to appointment time. Bring photo ID and insurance information.  Contact information: [address] [place] [address] [phone]        Bernerd Limbo, MD Follow up in 1 week(s).   Specialty:  Family Medicine Why:  hospital discharge follow up, repeat cbc/cmp at follow up.  pcp to continue monitor pain control. Contact information: [place]  [address] [phone]            Contact information for after-discharge care    [facility] .   Service:  Skilled Nursing Contact information: [place] [phone]                   The results of significant diagnostics from this hospitalization (including imaging, microbiology, ancillary and laboratory) are listed below for reference.    Significant Diagnostic Studies: Ct Abdomen Pelvis Wo Contrast  Result Date: 07/10/2018 CLINICAL DATA:  67 year old female with new pain and fever. Also for follow-up of abdominal collection between the duodenum and pancreatic head. EXAM: CT ABDOMEN AND PELVIS WITHOUT CONTRAST TECHNIQUE: Multidetector CT imaging of the abdomen and pelvis was performed following the standard protocol  without IV contrast. COMPARISON:  07/10/2018 MR, 07/08/2018 ultrasound, 05/28/2018 CT and prior studies FINDINGS: Please note that parenchymal abnormalities may be missed without intravenous contrast. Lower chest: New airspace disease and consolidation within the  LEFT LOWER lobe is compatible with pneumonia. A trace LEFT pleural effusion is noted. Cardiomegaly is identified. Hepatobiliary: The liver is unremarkable. The patient is status post cholecystectomy. No biliary dilatation. Pancreas: Unchanged. Ill-defined 4-5 cm heterogeneous structure in the region the pancreatic head/descending duodenum is unchanged measuring approximately 5 cm. No pancreatic ductal dilatation. Spleen: Unremarkable Adrenals/Urinary Tract: Punctate nonobstructing bilateral renal calculi are again identified. No acute abnormality. Foley catheter in the bladder is noted. Fullness of the LEFT adrenal gland again noted. Stomach/Bowel: No significant change. No bowel obstruction. Fullness/indistinctness along the descending duodenum again noted. Vascular/Lymphatic: Aorto bi-iliac stent graft again identified. Native infrarenal suprailiac abdominal aortic aneurysm sac measures 5 cm. Renal stent again noted. No enlarged lymph nodes identified. Reproductive: Status post hysterectomy. No adnexal masses. Other: Diffuse subcutaneous edema noted. No ascites, new collection or pneumoperitoneum. Musculoskeletal: No acute or suspicious bony abnormalities. IMPRESSION: 1. New LEFT LOWER pneumonia. 2. Ill-defined 4-5 cm heterogeneous structure along the pancreatic head/descending duodenum which may represent postoperative changes, hematoma or biloma. Infection is considered less likely. 3. Diffuse subcutaneous edema 4. Aorto bi-iliac stent graft 5. Punctate nonobstructing bilateral renal calculi Electronically Signed   By: Margarette Canada M.D.   On: 07/10/2018 18:15   Dg Elbow Complete Left (3+view)  Result Date: 06/26/2018 CLINICAL DATA:  Infected soft  tissue wound EXAM: LEFT ELBOW - COMPLETE 3+ VIEW COMPARISON:  None. FINDINGS: Frontal, lateral, and bilateral oblique views were obtained. There is generalized soft tissue swelling. No soft tissue air or radiopaque foreign body evident. No fracture or dislocation. No appreciable joint effusion. No appreciable joint space narrowing or erosion. No bony destruction evident. IMPRESSION: Diffuse soft tissue swelling without soft tissue air or radiopaque foreign body. No bony destruction or erosion. No fracture or dislocation. Electronically Signed   By: Lowella Grip III M.D.   On: 06/26/2018 07:52   Dg Forearm Right  Result Date: 06/30/2018 CLINICAL DATA:  Right forearm pain after fall. EXAM: RIGHT FOREARM - 2 VIEW COMPARISON:  None. FINDINGS: No acute fracture. Well corticated osseous density distal to the ulna styloid may be sequela of remote prior injury or an accessory ossicle. Wrist and elbow alignment is maintained. Bones are under mineralized. Mild soft tissue irregularity about the proximal forearm without radiopaque foreign body or tracking soft tissue air. IMPRESSION: No fracture of the right forearm. Electronically Signed   By: Keith Rake M.D.   On: 06/30/2018 04:57   Ct Head Wo Contrast  Result Date: 06/30/2018 CLINICAL DATA:  Fall striking head on floor. Head trauma, minor, GCS>=13, high clinical risk, initial exam EXAM: CT HEAD WITHOUT CONTRAST TECHNIQUE: Contiguous axial images were obtained from the base of the skull through the vertex without intravenous contrast. COMPARISON:  Brain MRI 06/20/2018.  Head CT 04/09/2018 FINDINGS: Brain: Unchanged degree of atrophy and chronic small vessel ischemia. No intracranial hemorrhage, mass effect, or midline shift. No hydrocephalus. The basilar cisterns are patent. No evidence of territorial infarct or acute ischemia. No extra-axial or intracranial fluid collection. Vascular: Atherosclerosis of skullbase vasculature without hyperdense vessel or  abnormal calcification. Skull: No fracture or focal lesion. Sinuses/Orbits: Paranasal sinuses and mastoid air cells are clear. The visualized orbits are unremarkable. Bilateral cataract resection. Other: Left frontoparietal scalp hematoma. Small right frontal scalp hematoma. IMPRESSION: Small left greater than right scalp hematomas. No acute intracranial abnormality. No skull fracture. Electronically Signed   By: Keith Rake M.D.   On: 06/30/2018 05:57   Mr Brain Wo Contrast  Result Date: 06/20/2018 CLINICAL  DATA:  Confusion EXAM: MRI HEAD WITHOUT CONTRAST TECHNIQUE: Multiplanar, multiecho pulse sequences of the brain and surrounding structures were obtained without intravenous contrast. COMPARISON:  Head CT 04/09/2018.  Brain MRI 08/31/2006 FINDINGS: Brain: No acute infarction, hemorrhage, hydrocephalus, extra-axial collection or mass lesion. Mild patchy FLAIR hyperintensity in the cerebral white matter, usually from old microvascular insults. There are remote micro hemorrhages in the right cerebral hemisphere roughly aligned along the watershed. No associated gliosis is seen. Patient has a history of endarterectomy, but this was left-sided. Unilaterally and lack of significant white matter disease argues against amyloid angiopathy. Cerebral volume loss since 2007, but mild for age. Vascular: Major flow voids are preserved Skull and upper cervical spine: No evidence of marrow lesion Sinuses/Orbits: Right cataract resection IMPRESSION: No acute or reversible finding. No specific explanation for confusion. Electronically Signed   By: Monte Fantasia M.D.   On: 06/20/2018 09:29   Mr Abdomen Mrcp Wo Contrast  Result Date: 07/10/2018 CLINICAL DATA:  Right upper quadrant pain. Gallstone pancreatitis. 2 months postop from cholecystectomy. Chronic kidney disease. EXAM: MRI ABDOMEN WITHOUT CONTRAST  (INCLUDING MRCP) TECHNIQUE: Multiplanar multisequence MR imaging of the abdomen was performed. Heavily  T2-weighted images of the biliary and pancreatic ducts were obtained, and three-dimensional MRCP images were rendered by post processing. COMPARISON:  CT on 05/28/2018 FINDINGS: Lower chest: No acute findings. Hepatobiliary: No masses visualized on this unenhanced exam. 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 Prior cholecystectomy. Dilated cystic duct stump is seen extending into the gallbladder fossa which contains a residual small calculus measuring approximately 5 mm. A small amount of fluid is seen in the gallbladder fossa and perihepatic space. Common bile duct measures 9 mm in diameter, which is within normal limits status post cholecystectomy. No evidence of choledocholithiasis. Pancreas: A heterogeneous mass with surrounding inflammatory changes is seen which abuts the pancreatic head and descending duodenum. This measures 4.9 x 3.7 cm on image 60/1100, and contains multiple small internal cystic foci. This has mildly decreased in size since previous study when it measured approximately 5.7 x 4.3 cm. Remainder of the pancreas is normal appearance, and there is no evidence of pancreatic ductal dilatation. Spleen: Within normal limits in size. Small amount of perisplenic fluid. Diffuse T2 hypointensity, consistent with hemosiderosis. Adrenals/Urinary tract: A few small renal cysts are noted bilaterally. No evidence of hydronephrosis. Stomach/Bowel: Marked decrease in wall thickening involving the hepatic flexure of the colon. No evidence of bowel obstruction. Vascular/Lymphatic: No pathologically enlarged lymph nodes identified. Aortic stent graft again noted. Stable size of native abdominal aortic aneurysm measuring 4.9 cm. Other: Diffuse body wall and mesenteric edema. Mild ascites in perihepatic and perisplenic spaces. Musculoskeletal:  No suspicious bone lesions identified. IMPRESSION: Prior cholecystectomy. Dilated cystic duct stump in gallbladder fossa containing  a 5 mm calculus. No evidence of biliary ductal dilatation or choledocholithiasis. Mild decrease in size of heterogeneous "mass" with internal cystic foci and surrounding inflammatory changes between the descending duodenum and pancreatic head. This may represent a biloma, abscess, or evolving pancreatic pseudocyst. Small amount of fluid in gallbladder fossa and upper abdomen; postop bile leak cannot be excluded. Consider nuclear medicine hepatobiliary scan for further evaluation. Decreased bowel wall thickening involving the hepatic flexure of colon. Aortic stent graft with stable size of 4.9 cm native abdominal aortic aneurysm. Hemosiderosis. Electronically Signed   By: Earle Gell M.D.   On: 07/10/2018 09:23   Mr 3d Recon At Scanner  Result Date: 07/10/2018 CLINICAL DATA:  Right  upper quadrant pain. Gallstone pancreatitis. 2 months postop from cholecystectomy. Chronic kidney disease. EXAM: MRI ABDOMEN WITHOUT CONTRAST  (INCLUDING MRCP) TECHNIQUE: Multiplanar multisequence MR imaging of the abdomen was performed. Heavily T2-weighted images of the biliary and pancreatic ducts were obtained, and three-dimensional MRCP images were rendered by post processing. COMPARISON:  CT on 05/28/2018 FINDINGS: Lower chest: No acute findings. Hepatobiliary: No masses visualized on this unenhanced exam. 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 Prior cholecystectomy. Dilated cystic duct stump is seen extending into the gallbladder fossa which contains a residual small calculus measuring approximately 5 mm. A small amount of fluid is seen in the gallbladder fossa and perihepatic space. Common bile duct measures 9 mm in diameter, which is within normal limits status post cholecystectomy. No evidence of choledocholithiasis. Pancreas: A heterogeneous mass with surrounding inflammatory changes is seen which abuts the pancreatic head and descending duodenum. This measures 4.9 x 3.7 cm on image  60/1100, and contains multiple small internal cystic foci. This has mildly decreased in size since previous study when it measured approximately 5.7 x 4.3 cm. Remainder of the pancreas is normal appearance, and there is no evidence of pancreatic ductal dilatation. Spleen: Within normal limits in size. Small amount of perisplenic fluid. Diffuse T2 hypointensity, consistent with hemosiderosis. Adrenals/Urinary tract: A few small renal cysts are noted bilaterally. No evidence of hydronephrosis. Stomach/Bowel: Marked decrease in wall thickening involving the hepatic flexure of the colon. No evidence of bowel obstruction. Vascular/Lymphatic: No pathologically enlarged lymph nodes identified. Aortic stent graft again noted. Stable size of native abdominal aortic aneurysm measuring 4.9 cm. Other: Diffuse body wall and mesenteric edema. Mild ascites in perihepatic and perisplenic spaces. Musculoskeletal:  No suspicious bone lesions identified. IMPRESSION: Prior cholecystectomy. Dilated cystic duct stump in gallbladder fossa containing a 5 mm calculus. No evidence of biliary ductal dilatation or choledocholithiasis. Mild decrease in size of heterogeneous "mass" with internal cystic foci and surrounding inflammatory changes between the descending duodenum and pancreatic head. This may represent a biloma, abscess, or evolving pancreatic pseudocyst. Small amount of fluid in gallbladder fossa and upper abdomen; postop bile leak cannot be excluded. Consider nuclear medicine hepatobiliary scan for further evaluation. Decreased bowel wall thickening involving the hepatic flexure of colon. Aortic stent graft with stable size of 4.9 cm native abdominal aortic aneurysm. Hemosiderosis. Electronically Signed   By: Earle Gell M.D.   On: 07/10/2018 09:23   Dg Abdomen Acute W/chest  Result Date: 07/08/2018 CLINICAL DATA:  Right lower quadrant pain. EXAM: DG ABDOMEN ACUTE W/ 1V CHEST COMPARISON:  Chest x-ray dated June 17, 2018.  Abdominal x-ray dated June 15, 2018. FINDINGS: Stable cardiomegaly status post CABG, aortic valve replacement, and left atrial appendage clipping. Worsened pulmonary vascular congestion with new mild interstitial edema. No focal consolidation, pleural effusion, or pneumothorax. There is no evidence of dilated bowel loops or free intraperitoneal air. No radiopaque calculi or other significant radiographic abnormality is seen. Unchanged aorto bi-iliac stent graft and left renal artery stent. No acute osseous abnormality. IMPRESSION: 1. Worsening pulmonary vascular congestion with new mild interstitial pulmonary edema. 2. No acute abdominal finding. Electronically Signed   By: Titus Dubin M.D.   On: 07/08/2018 12:41   Vas [place] Upper Extremity Venous Duplex  Result Date: 06/25/2018 UPPER VENOUS STUDY  Indications: Edema Risk Factors: Dialysis access left arm. Comparison Study: Right upper extremity venous duplex from 06/14/18 is available  for comparison. Negative bilateral upper extremity venous                   duplex from 06/05/18 is available for comparison. Performing Technologist: Sharion Dove RVS  Examination Guidelines: A complete evaluation includes B-mode imaging, spectral Doppler, color Doppler, and power Doppler as needed of all accessible portions of each vessel. Bilateral testing is considered an integral part of a complete examination. Limited examinations for reoccurring indications may be performed as noted.  Right Findings: +----------+------------+----------+---------+-----------+-------+ RIGHT     CompressiblePropertiesPhasicitySpontaneousSummary +----------+------------+----------+---------+-----------+-------+ IJV           Full                 Yes       Yes            +----------+------------+----------+---------+-----------+-------+ Subclavian                         Yes       Yes             +----------+------------+----------+---------+-----------+-------+ Axillary      Full                 Yes       Yes            +----------+------------+----------+---------+-----------+-------+ Brachial      Full                 Yes       Yes            +----------+------------+----------+---------+-----------+-------+ Radial        Full                                          +----------+------------+----------+---------+-----------+-------+ Ulnar         Full                                          +----------+------------+----------+---------+-----------+-------+ Cephalic      Full                                          +----------+------------+----------+---------+-----------+-------+ Basilic       Full                                          +----------+------------+----------+---------+-----------+-------+  Left Findings: +----------+------------+----------+---------+-----------+-------+ LEFT      CompressiblePropertiesPhasicitySpontaneousSummary +----------+------------+----------+---------+-----------+-------+ IJV           Full                 Yes       Yes            +----------+------------+----------+---------+-----------+-------+ Subclavian                         Yes       Yes            +----------+------------+----------+---------+-----------+-------+ Axillary  Yes       Yes            +----------+------------+----------+---------+-----------+-------+ Brachial      Full                 Yes       Yes            +----------+------------+----------+---------+-----------+-------+ Radial        Full                                          +----------+------------+----------+---------+-----------+-------+ Ulnar         Full                                          +----------+------------+----------+---------+-----------+-------+ Cephalic      Full                                           +----------+------------+----------+---------+-----------+-------+ Basilic       Full                                          +----------+------------+----------+---------+-----------+-------+  Summary:  Right: No evidence of deep vein thrombosis in the upper extremity. No evidence of superficial vein thrombosis in the upper extremity. SVT found in right cephalic vein 87/5/64, appears to have resolved.  Left: No evidence of deep vein thrombosis in the upper extremity. No evidence of superficial vein thrombosis in the upper extremity.  *See table(s) above for measurements and observations.  Diagnosing physician: Harold Barban MD Electronically signed by Harold Barban MD on 06/25/2018 at 8:07:27 PM.    Final    Nm Hepato Biliary Leak  Result Date: 07/12/2018 CLINICAL DATA:  Patient status post cholecystectomy. Persistent abdominal pain. Cystic duct remnant noted on comparison MRI. EXAM: NUCLEAR MEDICINE HEPATOBILIARY IMAGING TECHNIQUE: Sequential images of the abdomen were obtained out to 120 minutes following intravenous administration of radiopharmaceutical. RADIOPHARMACEUTICALS:  5.5 mCi Tc-44m  Choletec IV COMPARISON:  MRI 06/09/2018, CT 06/09/2018 FINDINGS: Prompt clearance of radiotracer from the blood pool and homogeneous accumulation within liver parenchyma. Counts are promptly excreted into the extrahepatic ducts and common bile ducts. The duodenum is evident by 20 minutes. Small bowel evident by 40 minutes. The cystic duct remnant begins to fill by 40 minutes. This duct continues to fill over the course the study (120 minutes). No evidence of extraluminal radiotracer to suggest bile leak. IMPRESSION: 1. No evidence of bile leak or obstruction. 2. Patent common bile duct. 3. Prominent cystic duct remnant as seen on comparison MRI. Electronically Signed   By: Suzy Bouchard M.D.   On: 07/12/2018 14:46   US Abdomen Limited Ruq  Result Date: 07/08/2018 CLINICAL DATA:  Right upper  quadrant pain since last night. EXAM: ULTRASOUND ABDOMEN LIMITED RIGHT UPPER QUADRANT COMPARISON:  CT abdomen 05/28/2018, 04/12/2018 FINDINGS: Gallbladder: Prior cholecystectomy. 7.2 x 3.2 x 3.2 cm thick walled fluid collection with hyperechoic wall and central hypoechogenicity with the hypoechoic area measuring approximately 3.2 x 1.2 x 1.3 cm. Trace amount of fluid outside of the collection. There  is no significant surrounding Doppler flow to suggest a phlegmon. Common bile duct: Diameter: 8.7 mm.  Proximal pancreatic duct measures 3.7 mm. Liver: No focal hepatic mass. Intrahepatic biliary ductal dilatation. Portal vein is patent on color Doppler imaging with normal direction of blood flow towards the liver. No significant perihepatic ascites. IMPRESSION: 1. Prior cholecystectomy. 7.2 x 3.2 x 3.2 cm complex fluid collection in the gallbladder fossa likely reflecting a postoperative hematoma. The liquified portion measures approximately 3.2 x 1.2 x 1.3 cm which is smaller compared with the prior CT dated 05/28/2018. Electronically Signed   By: Kathreen Devoid   On: 07/08/2018 17:21    Microbiology: Recent Results (from the past 240 hour(s))  Culture, blood (Routine X 2) w Reflex to ID Panel     Status: None   Collection Time: 07/10/18 10:36 AM  Result Value Ref Range Status   Specimen Description   Final    BLOOD RIGHT ARM Performed at [facility] [address], [place], [place] [address]    Special Requests   Final    BOTTLES DRAWN AEROBIC AND ANAEROBIC Blood Culture adequate volume Performed at [facility] [address], [place], [place] [address]    Culture   Final    NO GROWTH 5 DAYS Performed at [facility], [place] [address] [place], [place] [address]    Report Status 07/15/2018 FINAL  Final  Culture, blood (Routine X 2) w Reflex to ID Panel     Status: None   Collection Time: 07/10/18 10:38 AM  Result Value Ref Range Status   Specimen  Description   Final    BLOOD RIGHT HAND Performed at [facility] [address], [place], [place] [address]    Special Requests   Final    BOTTLES DRAWN AEROBIC ONLY Blood Culture adequate volume Performed at [facility] [address] [place], [place] [address]    Culture   Final    NO GROWTH 5 DAYS Performed at [facility], [place] [address], [place], [place] [address]    Report Status 07/15/2018 FINAL  Final     Labs: Basic Metabolic Panel: Recent Labs  Lab 07/13/18 0419  07/15/18 0404 07/16/18 0444 07/17/18 0505 07/18/18 0520 07/19/18 0458  NA 141  --  142 140 140  --   --   K 4.4  --  2.8* 3.0* 3.5  --   --   CL 98  --  100 100 101  --   --   CO2 30  --  30 31 31   --   --   GLUCOSE 133*  --  96 124* 116*  --   --   BUN 59*  --  56* 56* 56*  --   --   CREATININE 2.74*   < > 2.52* 2.39* 2.30* 2.31* 2.34*  CALCIUM 8.8*  --  8.2* 8.2* 8.3*  --   --    < > = values in this interval not displayed.   Liver Function Tests: No results for input(s): AST, ALT, ALKPHOS, BILITOT, PROT, ALBUMIN in the last 168 hours. No results for input(s): LIPASE, AMYLASE in the last 168 hours. No results for input(s): AMMONIA in the last 168 hours. CBC: Recent Labs  Lab 07/13/18 0419 07/15/18 0404  WBC 9.2 8.0  NEUTROABS 6.9 5.5  HGB 8.1* 7.3*  HCT 27.2* 24.6*  MCV 96.8 96.9  PLT 287 261   Cardiac  Enzymes: No results for input(s): CKTOTAL, CKMB, CKMBINDEX, TROPONINI in the last 168 hours. BNP: BNP (last 3 results) Recent Labs    04/17/18 0534 04/18/18 0501 05/28/18 2325  BNP 2,002.3* 1,099.7* 2,077.1*    ProBNP (last 3 results) No results for input(s): PROBNP in the last 8760 hours.  CBG: No results for input(s): GLUCAP in the last 168 hours.     Signed:  Florencia Reasons MD, PhD  [facility] 07/19/2018, 12:17 PM

## 2018-07-19 NOTE — Care Management Note (Signed)
Case Management Note  Patient Details  Name: [name] MRN: [id number] Date of Birth: Jan 16, 1951  Subjective/Objective:                    Action/Plan:   Expected Discharge Date:  07/19/18               Expected Discharge Plan:  [place]  In-House Referral:  Clinical Social Work  Discharge planning Services  CM Consult  Post Acute Care Choice:    Choice offered to:     DME Arranged:    DME Agency:     HH Arranged:  RN, PT, OT, Nurse's Aide, Social Work [employer] Agency:  [facility]  Status of Service:  Completed, signed off  If discussed at [employer], dates discussed:    Additional CommentsPurcell Mouton, RN 07/19/2018, 11:46 AM

## 2018-07-24 ENCOUNTER — Encounter (HOSPITAL_COMMUNITY): Payer: Self-pay | Admitting: Family Medicine

## 2018-07-24 ENCOUNTER — Inpatient Hospital Stay (HOSPITAL_COMMUNITY)
Admission: EM | Admit: 2018-07-24 | Discharge: 2018-08-03 | DRG: 682 | Disposition: A | Discharge status: Home Health | Payer: Medicare Other | Attending: Family Medicine | Admitting: Family Medicine

## 2018-07-24 ENCOUNTER — Other Ambulatory Visit: Payer: Self-pay

## 2018-07-24 ENCOUNTER — Emergency Department (HOSPITAL_COMMUNITY): Payer: Medicare Other

## 2018-07-24 ENCOUNTER — Ambulatory Visit: Payer: Self-pay | Admitting: Gastroenterology

## 2018-07-24 DIAGNOSIS — E785 Hyperlipidemia, unspecified: Secondary | ICD-10-CM | POA: Diagnosis present

## 2018-07-24 DIAGNOSIS — M109 Gout, unspecified: Secondary | ICD-10-CM | POA: Diagnosis present

## 2018-07-24 DIAGNOSIS — Z951 Presence of aortocoronary bypass graft: Secondary | ICD-10-CM

## 2018-07-24 DIAGNOSIS — F17210 Nicotine dependence, cigarettes, uncomplicated: Secondary | ICD-10-CM | POA: Diagnosis present

## 2018-07-24 DIAGNOSIS — Z79891 Long term (current) use of opiate analgesic: Secondary | ICD-10-CM

## 2018-07-24 DIAGNOSIS — T424X5A Adverse effect of benzodiazepines, initial encounter: Secondary | ICD-10-CM | POA: Diagnosis present

## 2018-07-24 DIAGNOSIS — G92 Toxic encephalopathy: Secondary | ICD-10-CM | POA: Diagnosis present

## 2018-07-24 DIAGNOSIS — G894 Chronic pain syndrome: Secondary | ICD-10-CM | POA: Diagnosis present

## 2018-07-24 DIAGNOSIS — F419 Anxiety disorder, unspecified: Secondary | ICD-10-CM | POA: Diagnosis present

## 2018-07-24 DIAGNOSIS — T502X5A Adverse effect of carbonic-anhydrase inhibitors, benzothiadiazides and other diuretics, initial encounter: Secondary | ICD-10-CM | POA: Diagnosis present

## 2018-07-24 DIAGNOSIS — D649 Anemia, unspecified: Secondary | ICD-10-CM | POA: Diagnosis not present

## 2018-07-24 DIAGNOSIS — T402X5A Adverse effect of other opioids, initial encounter: Secondary | ICD-10-CM | POA: Diagnosis present

## 2018-07-24 DIAGNOSIS — D631 Anemia in chronic kidney disease: Secondary | ICD-10-CM | POA: Diagnosis present

## 2018-07-24 DIAGNOSIS — I70209 Unspecified atherosclerosis of native arteries of extremities, unspecified extremity: Secondary | ICD-10-CM | POA: Diagnosis present

## 2018-07-24 DIAGNOSIS — N179 Acute kidney failure, unspecified: Principal | ICD-10-CM

## 2018-07-24 DIAGNOSIS — M79672 Pain in left foot: Secondary | ICD-10-CM

## 2018-07-24 DIAGNOSIS — Z7189 Other specified counseling: Secondary | ICD-10-CM

## 2018-07-24 DIAGNOSIS — X58XXXA Exposure to other specified factors, initial encounter: Secondary | ICD-10-CM | POA: Diagnosis present

## 2018-07-24 DIAGNOSIS — G9340 Encephalopathy, unspecified: Secondary | ICD-10-CM | POA: Diagnosis present

## 2018-07-24 DIAGNOSIS — K859 Acute pancreatitis without necrosis or infection, unspecified: Secondary | ICD-10-CM | POA: Diagnosis present

## 2018-07-24 DIAGNOSIS — J449 Chronic obstructive pulmonary disease, unspecified: Secondary | ICD-10-CM | POA: Diagnosis present

## 2018-07-24 DIAGNOSIS — S92515A Nondisplaced fracture of proximal phalanx of left lesser toe(s), initial encounter for closed fracture: Secondary | ICD-10-CM | POA: Diagnosis present

## 2018-07-24 DIAGNOSIS — Z833 Family history of diabetes mellitus: Secondary | ICD-10-CM

## 2018-07-24 DIAGNOSIS — I252 Old myocardial infarction: Secondary | ICD-10-CM

## 2018-07-24 DIAGNOSIS — I5032 Chronic diastolic (congestive) heart failure: Secondary | ICD-10-CM

## 2018-07-24 DIAGNOSIS — Z825 Family history of asthma and other chronic lower respiratory diseases: Secondary | ICD-10-CM

## 2018-07-24 DIAGNOSIS — E876 Hypokalemia: Secondary | ICD-10-CM | POA: Diagnosis not present

## 2018-07-24 DIAGNOSIS — I10 Essential (primary) hypertension: Secondary | ICD-10-CM | POA: Diagnosis present

## 2018-07-24 DIAGNOSIS — J9601 Acute respiratory failure with hypoxia: Secondary | ICD-10-CM | POA: Diagnosis present

## 2018-07-24 DIAGNOSIS — R001 Bradycardia, unspecified: Secondary | ICD-10-CM | POA: Diagnosis present

## 2018-07-24 DIAGNOSIS — K863 Pseudocyst of pancreas: Secondary | ICD-10-CM | POA: Diagnosis present

## 2018-07-24 DIAGNOSIS — E440 Moderate protein-calorie malnutrition: Secondary | ICD-10-CM | POA: Diagnosis present

## 2018-07-24 DIAGNOSIS — K861 Other chronic pancreatitis: Secondary | ICD-10-CM | POA: Diagnosis present

## 2018-07-24 DIAGNOSIS — R5383 Other fatigue: Secondary | ICD-10-CM

## 2018-07-24 DIAGNOSIS — Z9049 Acquired absence of other specified parts of digestive tract: Secondary | ICD-10-CM

## 2018-07-24 DIAGNOSIS — Z952 Presence of prosthetic heart valve: Secondary | ICD-10-CM

## 2018-07-24 DIAGNOSIS — Z955 Presence of coronary angioplasty implant and graft: Secondary | ICD-10-CM

## 2018-07-24 DIAGNOSIS — I2510 Atherosclerotic heart disease of native coronary artery without angina pectoris: Secondary | ICD-10-CM | POA: Diagnosis present

## 2018-07-24 DIAGNOSIS — I480 Paroxysmal atrial fibrillation: Secondary | ICD-10-CM | POA: Diagnosis present

## 2018-07-24 DIAGNOSIS — I132 Hypertensive heart and chronic kidney disease with heart failure and with stage 5 chronic kidney disease, or end stage renal disease: Secondary | ICD-10-CM | POA: Diagnosis present

## 2018-07-24 DIAGNOSIS — E1122 Type 2 diabetes mellitus with diabetic chronic kidney disease: Secondary | ICD-10-CM | POA: Diagnosis present

## 2018-07-24 DIAGNOSIS — I25118 Atherosclerotic heart disease of native coronary artery with other forms of angina pectoris: Secondary | ICD-10-CM

## 2018-07-24 DIAGNOSIS — I5033 Acute on chronic diastolic (congestive) heart failure: Secondary | ICD-10-CM | POA: Diagnosis present

## 2018-07-24 DIAGNOSIS — Z8349 Family history of other endocrine, nutritional and metabolic diseases: Secondary | ICD-10-CM

## 2018-07-24 DIAGNOSIS — G8929 Other chronic pain: Secondary | ICD-10-CM | POA: Diagnosis present

## 2018-07-24 DIAGNOSIS — Z7951 Long term (current) use of inhaled steroids: Secondary | ICD-10-CM

## 2018-07-24 DIAGNOSIS — K219 Gastro-esophageal reflux disease without esophagitis: Secondary | ICD-10-CM | POA: Diagnosis present

## 2018-07-24 DIAGNOSIS — Z6820 Body mass index (BMI) 20.0-20.9, adult: Secondary | ICD-10-CM

## 2018-07-24 DIAGNOSIS — N184 Chronic kidney disease, stage 4 (severe): Secondary | ICD-10-CM | POA: Diagnosis present

## 2018-07-24 DIAGNOSIS — Z7902 Long term (current) use of antithrombotics/antiplatelets: Secondary | ICD-10-CM

## 2018-07-24 DIAGNOSIS — Z8249 Family history of ischemic heart disease and other diseases of the circulatory system: Secondary | ICD-10-CM

## 2018-07-24 LAB — COMPREHENSIVE METABOLIC PANEL
ALT: 8 U/L (ref 0–44)
AST: 15 U/L (ref 15–41)
Albumin: 3 g/dL — ABNORMAL LOW (ref 3.5–5.0)
Alkaline Phosphatase: 51 U/L (ref 38–126)
Anion gap: 14 (ref 5–15)
BUN: 84 mg/dL — AB (ref 8–23)
CO2: 23 mmol/L (ref 22–32)
CREATININE: 3.9 mg/dL — AB (ref 0.44–1.00)
Calcium: 8.6 mg/dL — ABNORMAL LOW (ref 8.9–10.3)
Chloride: 100 mmol/L (ref 98–111)
GFR, EST AFRICAN AMERICAN: 13 mL/min — AB (ref >60)
GFR, EST NON AFRICAN AMERICAN: 11 mL/min — AB (ref >60)
GLUCOSE: 153 mg/dL — AB (ref 70–99)
POTASSIUM: 2.7 mmol/L — AB (ref 3.5–5.1)
Sodium: 137 mmol/L (ref 135–145)
Total Bilirubin: 0.8 mg/dL (ref 0.3–1.2)
Total Protein: 6.1 g/dL — ABNORMAL LOW (ref 6.5–8.1)

## 2018-07-24 LAB — BLOOD GAS, ARTERIAL
Acid-base deficit: 2.5 mmol/L — ABNORMAL HIGH (ref 0.0–2.0)
Bicarbonate: 22 mmol/L (ref 20.0–28.0)
Drawn by: 225631
FIO2: 21
LHR: 18 {breaths}/min
O2 Saturation: 93.4 %
PH ART: 7.372 (ref 7.350–7.450)
PO2 ART: 68 mmHg — AB (ref 83.0–108.0)
Patient temperature: 98.2
pCO2 arterial: 38.7 mmHg (ref 32.0–48.0)

## 2018-07-24 LAB — CBC
HEMATOCRIT: 26.4 % — AB (ref 36.0–46.0)
HEMOGLOBIN: 7.9 g/dL — AB (ref 12.0–15.0)
MCH: 29.2 pg (ref 26.0–34.0)
MCHC: 29.9 g/dL — AB (ref 30.0–36.0)
MCV: 97.4 fL (ref 80.0–100.0)
Platelets: 337 10*3/uL (ref 150–400)
RBC: 2.71 MIL/uL — ABNORMAL LOW (ref 3.87–5.11)
RDW: 20.6 % — ABNORMAL HIGH (ref 11.5–15.5)
WBC: 8.7 10*3/uL (ref 4.0–10.5)
nRBC: 0 % (ref 0.0–0.2)

## 2018-07-24 LAB — AMMONIA: Ammonia: 41 umol/L — ABNORMAL HIGH (ref 9–35)

## 2018-07-24 LAB — ETHANOL

## 2018-07-24 LAB — MAGNESIUM: Magnesium: 1.9 mg/dL (ref 1.7–2.4)

## 2018-07-24 MED ORDER — GABAPENTIN 300 MG PO CAPS
300.0000 mg | ORAL_CAPSULE | Freq: Every day | ORAL | Status: DC
  Administered 2018-07-25 – 2018-08-03 (×10): 300 mg via ORAL
  Filled 2018-07-24 (×10): qty 1

## 2018-07-24 MED ORDER — PANTOPRAZOLE SODIUM 40 MG PO TBEC
40.0000 mg | DELAYED_RELEASE_TABLET | Freq: Two times a day (BID) | ORAL | Status: DC
  Administered 2018-07-25 – 2018-08-02 (×17): 40 mg via ORAL
  Filled 2018-07-24 (×18): qty 1

## 2018-07-24 MED ORDER — ACETAMINOPHEN 325 MG PO TABS
650.0000 mg | ORAL_TABLET | Freq: Four times a day (QID) | ORAL | Status: DC | PRN
  Administered 2018-07-31 (×3): 650 mg via ORAL
  Filled 2018-07-24 (×3): qty 2

## 2018-07-24 MED ORDER — ALLOPURINOL 100 MG PO TABS
100.0000 mg | ORAL_TABLET | Freq: Every day | ORAL | Status: DC
  Administered 2018-07-25 – 2018-08-03 (×10): 100 mg via ORAL
  Filled 2018-07-24 (×10): qty 1

## 2018-07-24 MED ORDER — HEPARIN SODIUM (PORCINE) 5000 UNIT/ML IJ SOLN
5000.0000 [IU] | Freq: Three times a day (TID) | INTRAMUSCULAR | Status: DC
  Administered 2018-07-25: 5000 [IU] via SUBCUTANEOUS
  Filled 2018-07-24 (×2): qty 1

## 2018-07-24 MED ORDER — OXYCODONE HCL 5 MG PO TABS: 5.0000 mg | ORAL_TABLET | Freq: Three times a day (TID) | ORAL | Status: DC | PRN

## 2018-07-24 MED ORDER — BOOST / RESOURCE BREEZE PO LIQD CUSTOM
1.0000 | Freq: Two times a day (BID) | ORAL | Status: DC
  Administered 2018-07-25 – 2018-07-26 (×3): 1 via ORAL
  Filled 2018-07-24: qty 1

## 2018-07-24 MED ORDER — ALBUTEROL SULFATE HFA 108 (90 BASE) MCG/ACT IN AERS: 2.0000 | INHALATION_SPRAY | RESPIRATORY_TRACT | Status: DC | PRN

## 2018-07-24 MED ORDER — ATORVASTATIN CALCIUM 40 MG PO TABS
40.0000 mg | ORAL_TABLET | Freq: Every day | ORAL | Status: DC
  Administered 2018-07-25 – 2018-08-02 (×9): 40 mg via ORAL
  Filled 2018-07-24 (×10): qty 1

## 2018-07-24 MED ORDER — MOMETASONE FURO-FORMOTEROL FUM 200-5 MCG/ACT IN AERO
2.0000 | INHALATION_SPRAY | Freq: Two times a day (BID) | RESPIRATORY_TRACT | Status: DC
  Administered 2018-07-25 – 2018-08-03 (×15): 2 via RESPIRATORY_TRACT
  Filled 2018-07-24 (×2): qty 8.8

## 2018-07-24 MED ORDER — DULOXETINE HCL 30 MG PO CPEP: 60.0000 mg | ORAL_CAPSULE | Freq: Every day | ORAL | Status: DC

## 2018-07-24 MED ORDER — HYDRALAZINE HCL 25 MG PO TABS
25.0000 mg | ORAL_TABLET | Freq: Three times a day (TID) | ORAL | Status: DC
  Administered 2018-07-25 – 2018-07-30 (×13): 25 mg via ORAL
  Filled 2018-07-24 (×16): qty 1

## 2018-07-24 MED ORDER — SODIUM CHLORIDE 0.9 % IV BOLUS
250.0000 mL | Freq: Once | INTRAVENOUS | Status: AC
  Administered 2018-07-24: 250 mL via INTRAVENOUS

## 2018-07-24 MED ORDER — AMLODIPINE BESYLATE 10 MG PO TABS
10.0000 mg | ORAL_TABLET | Freq: Every day | ORAL | Status: DC
  Administered 2018-07-26 – 2018-08-03 (×9): 10 mg via ORAL
  Filled 2018-07-24 (×9): qty 1
  Filled 2018-07-24: qty 2

## 2018-07-24 MED ORDER — VITAMIN B-12 5000 MCG SL SUBL: 5000.0000 ug | SUBLINGUAL_TABLET | Freq: Every day | SUBLINGUAL | Status: DC

## 2018-07-24 MED ORDER — POTASSIUM CHLORIDE 10 MEQ/100ML IV SOLN
10.0000 meq | Freq: Once | INTRAVENOUS | Status: AC
  Administered 2018-07-24: 10 meq via INTRAVENOUS
  Filled 2018-07-24: qty 100

## 2018-07-24 MED ORDER — CLONAZEPAM 0.5 MG PO TABS: 0.2500 mg | ORAL_TABLET | Freq: Every evening | ORAL | Status: DC | PRN

## 2018-07-24 MED ORDER — POTASSIUM CHLORIDE 10 MEQ/100ML IV SOLN
10.0000 meq | INTRAVENOUS | Status: AC
  Administered 2018-07-24 – 2018-07-25 (×2): 10 meq via INTRAVENOUS
  Filled 2018-07-24 (×2): qty 100

## 2018-07-24 MED ORDER — SODIUM CHLORIDE 0.9% FLUSH
3.0000 mL | Freq: Two times a day (BID) | INTRAVENOUS | Status: DC
  Administered 2018-07-25 – 2018-08-02 (×15): 3 mL via INTRAVENOUS

## 2018-07-24 MED ORDER — CARVEDILOL 12.5 MG PO TABS
12.5000 mg | ORAL_TABLET | Freq: Two times a day (BID) | ORAL | Status: DC
  Administered 2018-07-25 – 2018-08-03 (×19): 12.5 mg via ORAL
  Filled 2018-07-24 (×20): qty 1

## 2018-07-24 MED ORDER — POTASSIUM CHLORIDE CRYS ER 10 MEQ PO TBCR
10.0000 meq | EXTENDED_RELEASE_TABLET | Freq: Once | ORAL | Status: AC
  Administered 2018-07-25: 10 meq via ORAL
  Filled 2018-07-24: qty 1

## 2018-07-24 MED ORDER — ACETAMINOPHEN 650 MG RE SUPP: 650.0000 mg | Freq: Four times a day (QID) | RECTAL | Status: DC | PRN

## 2018-07-24 MED ORDER — CLOPIDOGREL BISULFATE 75 MG PO TABS
75.0000 mg | ORAL_TABLET | Freq: Every day | ORAL | Status: DC
  Administered 2018-07-25 – 2018-08-03 (×10): 75 mg via ORAL
  Filled 2018-07-24 (×10): qty 1

## 2018-07-24 MED ORDER — LACTULOSE 10 GM/15ML PO SOLN: 20.0000 g | Freq: Two times a day (BID) | ORAL | Status: DC

## 2018-07-24 MED ORDER — VITAMIN B-12 1000 MCG PO TABS
5000.0000 ug | ORAL_TABLET | Freq: Every day | ORAL | Status: DC
  Administered 2018-07-26: 5000 ug via ORAL
  Filled 2018-07-24 (×2): qty 5

## 2018-07-24 MED ORDER — ONDANSETRON HCL 4 MG PO TABS: 4.0000 mg | ORAL_TABLET | Freq: Four times a day (QID) | ORAL | Status: DC | PRN

## 2018-07-24 MED ORDER — ONDANSETRON HCL 4 MG/2ML IJ SOLN
4.0000 mg | Freq: Four times a day (QID) | INTRAMUSCULAR | Status: DC | PRN
  Administered 2018-07-26 – 2018-08-01 (×2): 4 mg via INTRAVENOUS
  Filled 2018-07-24 (×2): qty 2

## 2018-07-24 MED ORDER — POTASSIUM CHLORIDE IN NACL 20-0.9 MEQ/L-% IV SOLN
INTRAVENOUS | Status: DC
  Administered 2018-07-25: 01:00:00 via INTRAVENOUS
  Filled 2018-07-24: qty 1000

## 2018-07-24 MED ORDER — OXYCODONE HCL ER 10 MG PO T12A
10.0000 mg | EXTENDED_RELEASE_TABLET | Freq: Two times a day (BID) | ORAL | Status: DC
  Administered 2018-07-25: 10 mg via ORAL
  Filled 2018-07-24 (×2): qty 1

## 2018-07-24 NOTE — H&P (Signed)
History and Physical    [name] YBF:[id number] DOB: [birth year] DOA: 07/24/2018  PCP: Bernerd Limbo, MD   Patient coming from: Home   Chief Complaint: "sleepy"  HPI: [name] is a 67 y.o. female with medical history significant for AAA status post endovascular repair, chronic kidney disease stage IV, coronary artery disease, chronic pain, and chronic diastolic CHF, now presenting to the emergency department for evaluation of somnolence.  Patient's daughter was in the emergency department and assisted with the history, reporting that patient has been unusually sleepy, having difficulty staying awake today.  Patient herself reports increased fatigue since 07/22/2018, but denies any fevers, chills, chest pain, shortness of breath, headache, abdominal pain, or dysuria.  Patient uses oxycodone for chronic pain but denies taking any more than usual recently.  Denies recent fall or trauma.  ED Course: Upon arrival to the ED, patient is found to be afebrile, saturating well on room air, bradycardic in the mid 50s, and with stable blood pressure.  EKG features sinus rhythm with RBBB.  Noncontrast head CT is negative for acute intracranial abnormality.  Chest x-ray is notable for cardiomegaly and pulmonary edema, similar to prior.  Chemistry panel features a potassium of 2.7, BUN 84, and creatinine of 3.90, up from baseline of roughly 2.3.  CBC features a stable normocytic anemia with hemoglobin 7.9.  Patient was given 250 cc normal saline and 20 mEq of IV potassium in the ED.  She remains hemodynamically stable, has difficulty staying awake, and will be observed for ongoing evaluation and management.  Review of Systems:  Unable to complete ROS secondary to the patient's clinical condition.  Past Medical History:  Diagnosis Date  . AAA (abdominal aortic aneurysm) ([place]) 5/09   3.7x3.3 by u/s 2009  . Abnormal EKG    deep TW inversions chronic  . Anemia   . CAD (coronary artery disease)    a.  CABG 03/2011 LIMA to LAD, SVG to OM, SVG to PDA. b. cath 06/25/2015 DES to SVG to rPDA, patent LIMA to LAD, patent SVG to OM  . carotid stenosis 5/09   S/p L CEA ;  60-79% bilat ICA by preCABG dopplers 7/12  . CHF (congestive heart failure) ([place])   . Chronic back pain    "all over" (06/24/2015)  . Chronic bronchitis ([place])    "get it pretty much q yr" (06/24/2015)  . CKD (chronic kidney disease)   . Complication of anesthesia 1966   "problem w/ether"  . COPD (chronic obstructive pulmonary disease) ([place])   . Depression   . GERD (gastroesophageal reflux disease)    With hiatal hernia  . GIB (gastrointestinal bleeding) 9/11   S/p EGD with cautery at [facility]  . Heart murmur   . History of blood transfusion "a few times"   "all related to anemia" (06/24/2015)  . History of hiatal hernia   . Hyperlipidemia   . Hypertension   . Mitral regurgitation    3+ MR by intraoperative TEE;  s/p MV repair with Dr. Roxy Manns 7/12  . Myocardial infarction [facility]) 2012 "several"  . On home oxygen therapy    "2 liters at night; negative for sleep apnea"  . PAD (peripheral artery disease) (HCC)    Severe; s/p bilateral renal artery stents, moderate in-stent restenosis  . Pancreatitis 05/2018  . Paroxysmal atrial fibrillation (HCC)    coumadin;  echo 9/07: EF 60%, mild LVH;  s/p Cox Maze 7/12 with LAA clipping  . Subclavian artery stenosis, left (HCC)  stented by Dr. Trula Slade on 10/18 to help flow of her LIMA to LAD  . Type II diabetes mellitus ([place])     Past Surgical History:  Procedure Laterality Date  . A/V FISTULAGRAM Left 03/21/2018   Procedure: A/V FISTULAGRAM;  Surgeon: Serafina Mitchell, MD;  Location: [place] [facility];  Service: Cardiovascular;  Laterality: Left;  . ABDOMINAL AORTIC ENDOVASCULAR STENT GRAFT N/A 01/17/2018   Procedure: ABDOMINAL AORTIC ENDOVASCULAR STENT GRAFT WITH CO2;  Surgeon: Serafina Mitchell, MD;  Location: [place];  Service: Vascular;  Laterality: N/A;  . ABDOMINAL HYSTERECTOMY   1990's  . APPENDECTOMY  Aug. 11, 2016   Ruptured  . AV FISTULA PLACEMENT Left 11/03/2017   Procedure: ARTERIOVENOUS (AV) FISTULA CREATION LEFT ARM;  Surgeon: Serafina Mitchell, MD;  Location: [place];  Service: Vascular;  Laterality: Left;  . BOWEL RESECTION  2013  . CARDIAC CATHETERIZATION N/A 06/24/2015   Procedure: Left Heart Cath and Cors/Grafts Angiography;  Surgeon: Leonie Man, MD;  Location: [place] [facility];  Service: Cardiovascular;  Laterality: N/A;  . CARDIAC CATHETERIZATION N/A 06/25/2015   Procedure: Coronary Stent Intervention;  Surgeon: Sherren Mocha, MD;  Location: [place] [facility];  Service: Cardiovascular;  Laterality: N/A;  . CAROTID ENDARTERECTOMY Left   . CATARACT EXTRACTION Right 03/27/2018  . CHOLECYSTECTOMY N/A 04/19/2018   Procedure: LAPAROSCOPIC CHOLECYSTECTOMY WITH INTRAOPERATIVE CHOLANGIOGRAM;  Surgeon: Rolm Bookbinder, MD;  Location: [place];  Service: General;  Laterality: N/A;  . CORONARY ANGIOPLASTY    . CORONARY ARTERY BYPASS GRAFT  03/05/2011   CABG X3 (LIMA to LAD, SVG to OM, SVG to PDA, EVH via left thigh  . CORONARY STENT PLACEMENT    . ESOPHAGOGASTRODUODENOSCOPY N/A 04/12/2018   Procedure: ESOPHAGOGASTRODUODENOSCOPY (EGD);  Surgeon: [name], MD;  Location: [facility] ENDOSCOPY;  Service: Endoscopy;  Laterality: N/A;  . ESOPHAGOGASTRODUODENOSCOPY N/A 06/16/2018   Procedure: ESOPHAGOGASTRODUODENOSCOPY (EGD);  Surgeon: Thornton Park, MD;  Location: [place];  Service: Gastroenterology;  Laterality: N/A;  . LACERATION REPAIR Right 1990's   WRIST  . MAZE  03/05/2011   complete biatrial lesion set with clipping of LA appendage  . MITRAL VALVE REPAIR  03/05/2011   1m Memo 3D ring annuloplasty for ischemic MR  . PERIPHERAL VASCULAR BALLOON ANGIOPLASTY Left 03/21/2018   Procedure: PERIPHERAL VASCULAR BALLOON ANGIOPLASTY;  Surgeon: BSerafina Mitchell MD;  [place];  Service: Cardiovascular;  Laterality: Left;  . PERIPHERAL  VASCULAR CATHETERIZATION N/A 06/24/2015   Procedure: Aortic Arch Angiography;  Surgeon: VSerafina Mitchell MD;  [place] [place];  Service: Cardiovascular;  Laterality: N/A;  . PERIPHERAL VASCULAR CATHETERIZATION  06/24/2015   Procedure: Peripheral Vascular Intervention;  Surgeon: VSerafina Mitchell MD;  [place];  Service: Cardiovascular;;  . PERIPHERAL VASCULAR CATHETERIZATION N/A 06/24/2015   Procedure: Abdominal Aortogram;  Surgeon: VSerafina Mitchell MD;  [place];  Service: Cardiovascular;  Laterality: N/A;  . RENAL ANGIOGRAM Bilateral 09/11/2013   Procedure: RENAL ANGIOGRAM;  Surgeon: VSerafina Mitchell MD;  [facility];  Service: Cardiovascular;  Laterality: Bilateral;  . RIGHT FEMORAL-POPLITEAL BYPASS       reports that she has been smoking cigarettes. She has smoked for the past 50.00 years. She has never used smokeless tobacco. She reports that she drinks alcohol. She reports that she has current or past drug history. Drug: Marijuana.  Allergies  Allergen Reactions  . Isosorbide Other (See Comments)    Can only  tolerate in low doses  . Isosorbide Nitrate Other (See Comments)    Can only tolerate in low doses  . Oxycodone-Acetaminophen Itching    Tolerates Hydrocodone.  Also tolerated oxycodone 07/13/18 admission.    . Codeine Itching  . Contrast Media [Iodinated Diagnostic Agents] Itching and Other (See Comments)    Itching of feet  . Ioxaglate Itching and Other (See Comments)    Itching of feet  . Metrizamide Itching and Other (See Comments)    Itching of feet  . Percocet [Oxycodone-Acetaminophen] Itching and Other (See Comments)    Tolerates Hydrocodone    Family History  Problem Relation Age of Onset  . Emphysema Mother   . Heart disease Mother        before age 13  . Hypertension Mother   . Hyperlipidemia Mother   . Heart attack Mother   . AAA (abdominal aortic aneurysm) Mother        rupture  . Heart attack  Father 46  . Emphysema Father   . Heart disease Father        before age 59  . Hyperlipidemia Father   . Hypertension Father   . Peripheral vascular disease Father   . Diabetes Brother   . Heart disease Brother        before age 36  . Hyperlipidemia Brother   . Hypertension Brother   . Heart attack Brother        CABG  . Heart disease Other        Vascular disease in grandparents, uncles and dad  . Colon cancer Neg Hx      Prior to Admission medications   Medication Sig Start Date End Date Taking? Authorizing Provider  acetaminophen (TYLENOL) 325 MG tablet Take 325 mg by mouth every 6 (six) hours as needed (for pain).   Yes [provider]  allopurinol (ZYLOPRIM) 100 MG tablet Take 100 mg by mouth daily. 02/02/17  Yes [provider]  amLODipine (NORVASC) 10 MG tablet Take 10 mg by mouth daily.     Yes [provider]  atorvastatin (LIPITOR) 40 MG tablet Take 40 mg by mouth daily.  12/07/17  Yes [provider]  carvedilol (COREG) 12.5 MG tablet Take 1 tablet (12.5 mg total) by mouth 2 (two) times daily with a meal. 06/26/18  Yes Thurnell Lose, MD  Cholecalciferol (VITAMIN D3) 5000 units TABS Take 5,000 Units by mouth daily.   Yes [provider]  clonazePAM (KLONOPIN) 0.5 MG tablet Take 0.5 tablets (0.25 mg total) by mouth at bedtime as needed for anxiety (for insomnia). 07/19/18 07/19/19 Yes Florencia Reasons, MD  clopidogrel (PLAVIX) 75 MG tablet Take 1 tablet (75 mg total) by mouth daily with breakfast. 06/26/15  Yes Meng, [place], PA  Cyanocobalamin (VITAMIN B-12) 5000 MCG SUBL Place 5,000 mcg under the tongue daily.   Yes [provider]  DULoxetine (CYMBALTA) 60 MG capsule Take 60 mg by mouth daily.  10/26/17  Yes [provider]  feeding supplement (BOOST / RESOURCE BREEZE) LIQD Take 1 Container by mouth 2 (two) times daily.   Yes [provider]  fenofibrate 160 MG tablet Take 1 tablet (160 mg total) by mouth daily.  10/08/15  Yes Delfina Redwood, MD  ferrous sulfate 325 (65 FE) MG tablet Take 1 tablet (325 mg total) by mouth daily with breakfast. 07/19/18  Yes Florencia Reasons, MD  furosemide (LASIX) 40 MG tablet Take 1 tablet (40 mg total) by mouth 2 (two)  times daily. Patient taking differently: Take 40 mg by mouth daily.  06/26/18  Yes Thurnell Lose, MD  gabapentin (NEURONTIN) 300 MG capsule TAKE 1 CAPSULE(300 MG) BY MOUTH TWICE DAILY Patient taking differently: Take 300 mg by mouth 2 (two) times daily.  05/05/18  Yes Serafina Mitchell, MD  hydrALAZINE (APRESOLINE) 25 MG tablet Take 1 tablet (25 mg total) by mouth every 8 (eight) hours. 06/26/18  Yes Thurnell Lose, MD  loperamide (IMODIUM A-D) 2 MG tablet Take 2 mg by mouth 4 (four) times daily as needed for diarrhea or loose stools.   Yes [provider]  MAGNESIUM PO Take 1 tablet by mouth daily.   Yes [provider]  oxyCODONE (OXYCONTIN) 10 mg 12 hr tablet Take 1 tablet (10 mg total) by mouth every 12 (twelve) hours. 07/19/18  Yes Florencia Reasons, MD  oxyCODONE 10 MG TABS Take 1 tablet (10 mg total) by mouth every 4 (four) hours as needed for moderate pain or severe pain. 07/19/18  Yes Florencia Reasons, MD  pantoprazole (PROTONIX) 40 MG tablet Take 1 tablet (40 mg total) by mouth 2 (two) times daily. 06/26/18  Yes Thurnell Lose, MD  POTASSIUM PO Take 1 tablet by mouth daily.   Yes [provider]  vitamin B-12 100 MCG tablet Take 1 tablet (100 mcg total) by mouth daily. 06/26/18  Yes Thurnell Lose, MD  albuterol (PROVENTIL HFA;VENTOLIN HFA) 108 (90 Base) MCG/ACT inhaler Inhale 2 puffs into the lungs every 4 (four) hours as needed for wheezing or shortness of breath. ProAir Patient not taking: Reported on 07/24/2018 10/08/15   Delfina Redwood, MD  Amino Acids-Protein Hydrolys (FEEDING SUPPLEMENT, PRO-STAT SUGAR FREE 64,) LIQD Take 30 mLs by mouth 3 (three) times daily with meals. Patient not taking: Reported on 07/24/2018 06/26/18    Thurnell Lose, MD  budesonide-formoterol [facility]) 160-4.5 MCG/ACT inhaler Inhale 2 puffs into the lungs 2 (two) times daily as needed (for flares).    [provider]  HYDROcodone-acetaminophen (NORCO/VICODIN) 5-325 MG tablet Take 1 tablet by mouth every 6 (six) hours as needed for moderate pain. Patient not taking: Reported on 07/24/2018 06/26/18   Thurnell Lose, MD  isosorbide mononitrate (IMDUR) 30 MG 24 hr tablet Take 0.5 tablets (15 mg total) by mouth daily. Patient not taking: Reported on 07/24/2018 12/02/15   Sherren Mocha, MD  [facility] 4 MG/0.1ML LIQD nasal spray kit Place 1 spray into the nose once.  07/21/18   [provider]  polyethylene glycol (MIRALAX / GLYCOLAX) packet Take 17 g by mouth daily as needed for mild constipation. 06/26/18   Thurnell Lose, MD  spironolactone (ALDACTONE) 25 MG tablet Take 1 tablet (25 mg total) by mouth daily. Patient not taking: Reported on 07/24/2018 06/26/18   Thurnell Lose, MD  XTAMPZA ER 9 MG C12A Take 9 mg by mouth daily.  07/21/18   [provider]    Physical Exam: Vitals:   07/24/18 2045 07/24/18 2100 07/24/18 2130 07/24/18 2200  BP:  (!) 142/53 (!) 124/52 (!) 141/39  Pulse: (!) 53 (!) 54 (!) 54 (!) 52  Resp: (!) _0 Temp:      TempSrc:      SpO2: 93% 94% 94% 93%  Weight:      Height:        Constitutional: NAD, sleeping, easily woken  Eyes: PERTLA, lids and conjunctivae normal ENMT: Mucous membranes are moist. Posterior pharynx clear of any exudate  or lesions.   Neck: normal, supple, no masses, no thyromegaly Respiratory: clear to auscultation bilaterally, no wheezing, no crackles. Normal respiratory effort.  Cardiovascular: S1 & S2 heard, regular rate and rhythm. 2+ pretibial edema bilaterally. Abdomen: No distension, no tenderness, soft. Bowel sounds normal.  Musculoskeletal: no clubbing / cyanosis. No joint deformity upper and lower extremities.    Skin: no significant  rashes, lesions, ulcers. Warm, dry, well-perfused. Neurologic: CN 2-12 grossly intact. Sensation intact. Strength 5/5 in all 4 limbs.  Psychiatric: Somnolent, easily woken and oriented x 3. Cooperative.    Labs on Admission: I have personally reviewed following labs and imaging studies  CBC: Recent Labs  Lab 07/24/18 1827  WBC 8.7  HGB 7.9*  HCT 26.4*  MCV 97.4  PLT 779   Basic Metabolic Panel: Recent Labs  Lab 07/18/18 0520 07/19/18 0458 07/24/18 1827 07/24/18 1954  NA  --   --  137  --   K  --   --  2.7*  --   CL  --   --  100  --   CO2  --   --  23  --   GLUCOSE  --   --  153*  --   BUN  --   --  84*  --   CREATININE 2.31* 2.34* 3.90*  --   CALCIUM  --   --  8.6*  --   MG  --   --   --  1.9   GFR: Estimated Creatinine Clearance: 12.9 mL/min (A) (by C-G formula based on SCr of 3.9 mg/dL (H)). Liver Function Tests: Recent Labs  Lab 07/24/18 1827  AST 15  ALT 8  ALKPHOS 51  BILITOT 0.8  PROT 6.1*  ALBUMIN 3.0*   No results for input(s): LIPASE, AMYLASE in the last 168 hours. Recent Labs  Lab 07/24/18 1954  AMMONIA 41*   Coagulation Profile: No results for input(s): INR, PROTIME in the last 168 hours. Cardiac Enzymes: No results for input(s): CKTOTAL, CKMB, CKMBINDEX, TROPONINI in the last 168 hours. BNP (last 3 results) No results for input(s): PROBNP in the last 8760 hours. HbA1C: No results for input(s): HGBA1C in the last 72 hours. CBG: No results for input(s): GLUCAP in the last 168 hours. Lipid Profile: No results for input(s): CHOL, HDL, LDLCALC, TRIG, CHOLHDL, LDLDIRECT in the last 72 hours. Thyroid Function Tests: No results for input(s): TSH, T4TOTAL, FREET4, T3FREE, THYROIDAB in the last 72 hours. Anemia Panel: No results for input(s): VITAMINB12, FOLATE, FERRITIN, TIBC, IRON, RETICCTPCT in the last 72 hours. Urine analysis:    Component Value Date/Time   COLORURINE YELLOW 07/08/2018 1335   APPEARANCEUR HAZY (A) 07/08/2018 1335    LABSPEC 1.011 07/08/2018 1335   PHURINE 6.0 07/08/2018 1335   GLUCOSEU NEGATIVE 07/08/2018 1335   HGBUR NEGATIVE 07/08/2018 1335   BILIRUBINUR NEGATIVE 07/08/2018 1335   KETONESUR NEGATIVE 07/08/2018 1335   PROTEINUR NEGATIVE 07/08/2018 1335   UROBILINOGEN 0.2 03/04/2011 2107   NITRITE POSITIVE (A) 07/08/2018 1335   LEUKOCYTESUR LARGE (A) 07/08/2018 1335   Sepsis Labs: _0 (procalcitonin:4,lacticidven:4) )No results found for this or any previous visit (from the past 240 hour(s)).   Radiological Exams on Admission: Ct Head Wo Contrast  Result Date: 07/24/2018 CLINICAL DATA:  Altered mental status, somnolent. History of anemia, diabetes, pancreatitis. EXAM: CT HEAD WITHOUT CONTRAST TECHNIQUE: Contiguous axial images were obtained from the base of the skull through the vertex without intravenous contrast. COMPARISON:  CT HEAD June 30, 2018 FINDINGS: BRAIN:  No intraparenchymal hemorrhage, mass effect nor midline shift. Mild parenchymal brain volume loss. Patchy supratentorial white matter hypodensities within normal range for patient's age, though non-specific are most compatible with chronic small vessel ischemic disease. No acute large vascular territory infarcts. No abnormal extra-axial fluid collections. Basal cisterns are patent. VASCULAR: Severe calcific atherosclerosis of the carotid siphons. SKULL: No skull fracture. No significant scalp soft tissue swelling. SINUSES/ORBITS: Trace paranasal sinus mucosal thickening. Mastoid air cells are well aerated.The included ocular globes and orbital contents are non-suspicious. OTHER: None. IMPRESSION: 1. No acute intracranial process. 2. Mild parenchymal brain volume loss. 3. Mild chronic small vessel ischemic changes. Severe intracranial atherosclerosis. Electronically Signed   By: Elon Alas M.D.   On: 07/24/2018 20:33   Dg Chest Portable 1 View  Result Date: 07/24/2018 CLINICAL DATA:  Altered level of consciousness. EXAM:  PORTABLE CHEST 1 VIEW COMPARISON:  07/08/2018 FINDINGS: Previous median sternotomy. Aortic atherosclerosis. Mild cardiac enlargement. Moderate diffuse pulmonary edema is similar to previous exam. No focal airspace opacities. IMPRESSION: 1. Cardiac enlargement and pulmonary edema. 2.  Aortic Atherosclerosis (ICD10-I70.0). Electronically Signed   By: Kerby Moors M.D.   On: 07/24/2018 20:38    EKG: Independently reviewed. Sinus rhythm, RBBB.   Assessment/Plan  1. Acute encephalopathy  - Presents with difficulty staying awake  - No focal neurologic deficit identified and no acute intracranial finding on CT  - Labs with increased BUN & Cr, slight ammonia elevation, hypokalemia, and stable anemia  - Likely multifactorial with uremia and medications suspected to be playing a role  - Continue gentle IVF hydration, minimize & renally-dose sedating medications, check TSH, b12, and folate levels, continue supportive care    2. Acute kidney injury superimposed on CKD stage IV  - SCr is 3.90 on admission, up from apparent baseline of 2.3  - Given 250 cc NS bolus in ED  - Check urine chemistries, hold Lasix, continue a gentle IVF hydration, renally-dose medications, avoid nephrotoxins, and repeat chem panel in am    3. Hypokalemia  - Potassium is 2.7 on admission with normal magnesium  - Given 20 mEq IV and 10 mEq oral potassium  - Continue cardiac monitoring, repeat chem panel in am    4. Anemia  - Hgb is 7.9 on admission  - Appears to be stable with no active bleeding  - She will continue b12 and iron supplementation    5. Chronic pain  - No pain complaints on admission  - Continue long-acting oxycodone, continue as-needed oxycodone with dose-reduction given somnolence, hold Cymbalta given somnolence and worsened renal fxn, continue gabapentin with dose-reduction/renal-dosing    6. CAD - No anginal complaints  - Continue Plavix, statin, and beta-blocker    7. Chronic diastolic CHF  -  There is peripheral edema noted on exam, stable pulm edema on CXR without apparent dyspnea or respiratory complaints, and wt appears to be stable  - With increased BUN and creatinine, she was given a small fluid bolus in ED and is continued on a very gentle IVF hydration  - Follow daily wt and I/O's, hold Lasix while hydrating, and continue Coreg as tolerated   8. Hypertension  - BP at goal  - Continue Norvasc, Coreg, and hydralazine as tolerated     DVT prophylaxis: sq heparin  Code Status: Full  Family Communication: Daughter updated at bedside Consults called: None  Admission status: Observation     Vianne Bulls, MD [facility] Pager [phone]  If 7PM-7AM, please contact night-coverage  [email or web address] Password [email or web address]  07/24/2018, 10:18 PM

## 2018-07-24 NOTE — ED Notes (Signed)
Patient transported to CT 

## 2018-07-24 NOTE — ED Notes (Signed)
Respiratory at bedside of [facility]

## 2018-07-24 NOTE — ED Notes (Addendum)
[name] [phone] requesting an update when we have a room

## 2018-07-24 NOTE — ED Provider Notes (Addendum)
[name] Provider Note   CSN: [id number] Arrival date & time: 07/24/18  1746     History   Chief Complaint Chief Complaint  Patient presents with  . Altered Mental Status    HPI [name] is a 67 y.o. female.  HPI Pt presents to the ED for lethargy.  Per EMS report the pt has been very drowsy today.  She is sleeping a lot more than usual.  Pt has mutliple medical problems and is on chronic pain meds.  Pt denies any chance in her medications including her chronic pain meds.  In the ED pt easily falls back asleep but will answer questions appropriately when asked.  She denies cp or abd pain.  No fevers or chills.  No headache Past Medical History:  Diagnosis Date  . AAA (abdominal aortic aneurysm) ([place]) 5/09   3.7x3.3 by u/s 2009  . Abnormal EKG    deep TW inversions chronic  . Anemia   . CAD (coronary artery disease)    a. CABG 03/2011 LIMA to LAD, SVG to OM, SVG to PDA. b. cath 06/25/2015 DES to SVG to rPDA, patent LIMA to LAD, patent SVG to OM  . carotid stenosis 5/09   S/p L CEA ;  60-79% bilat ICA by preCABG dopplers 7/12  . CHF (congestive heart failure) ([place])   . Chronic back pain    "all over" (06/24/2015)  . Chronic bronchitis ([place])    "get it pretty much q yr" (06/24/2015)  . CKD (chronic kidney disease)   . Complication of anesthesia 1966   "problem w/ether"  . COPD (chronic obstructive pulmonary disease) ([place])   . Depression   . GERD (gastroesophageal reflux disease)    With hiatal hernia  . GIB (gastrointestinal bleeding) 9/11   S/p EGD with cautery at [facility]  . Heart murmur   . History of blood transfusion "a few times"   "all related to anemia" (06/24/2015)  . History of hiatal hernia   . Hyperlipidemia   . Hypertension   . Mitral regurgitation    3+ MR by intraoperative TEE;  s/p MV repair with Dr. Roxy Manns 7/12  . Myocardial infarction [facility]) 2012 "several"  . On home oxygen therapy    "2 liters at night; negative  for sleep apnea"  . PAD (peripheral artery disease) (HCC)    Severe; s/p bilateral renal artery stents, moderate in-stent restenosis  . Pancreatitis 05/2018  . Paroxysmal atrial fibrillation (HCC)    coumadin;  echo 9/07: EF 60%, mild LVH;  s/p Cox Maze 7/12 with LAA clipping  . Subclavian artery stenosis, left (HCC)    stented by Dr. Trula Slade on 10/18 to help flow of her LIMA to LAD  . Type II diabetes mellitus [facility])     Patient Active Problem List   Diagnosis Date Noted  . Malnutrition of moderate degree 07/13/2018  . Stage II pressure ulcer ([place]) 07/12/2018  . Fluid collection at surgical site   . Intraabdominal fluid collection   . Cystic duct calculus   . RUQ pain 07/08/2018  . Pancreatic pseudocyst 07/04/2018  . 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 Urinary retention 07/04/2018  . S/P insertion of iliac artery stent 07/04/2018  . Endoleak post (EVAR) endovascular aneurysm repair ([place]) 07/04/2018  . Physical deconditioning   . Esophageal ulcer with bleeding   . Pressure injury of skin 06/16/2018  . Hematemesis   . AKI (acute kidney injury) ([place]) 06/09/2018  . Palliative care patient   .  Palliative care by specialist   . Hyperkalemia 06/08/2018  . Anasarca 06/07/2018  . Demand ischemia ([place]) 06/07/2018  . Protein-calorie malnutrition, severe 05/29/2018  . Type II diabetes mellitus with renal manifestations ([place]) 05/28/2018  . Pancreatitis, recurrent 05/28/2018  . SIRS (systemic inflammatory response syndrome) ([place]) 05/28/2018  . Pancreatitis 05/28/2018  . Volume overload 04/17/2018  . Anemia due to GI blood loss 04/14/2018  . Acute gallstone pancreatitis 04/14/2018  . Acute cholecystitis 04/14/2018  . Gastrointestinal hemorrhage   . Fatigue associated with anemia 04/09/2018  . Hypoglycemia 01/19/2018  . Type II diabetes mellitus ([place]) 01/19/2018  . Nausea with vomiting 01/19/2018  . Uncontrolled hypertension 01/19/2018  . Hyperlipidemia 01/19/2018  . CAD  (coronary artery disease) 01/19/2018  . COPD with hypoxia ([place]) 01/19/2018  . Chronic kidney disease (CKD), stage IV (severe) ([place]) 01/19/2018  . Retroperitoneal hematoma 01/19/2018  . Acute blood loss anemia 01/19/2018  . Chronic diastolic congestive heart failure ([place]) 01/19/2018  . AAA (abdominal aortic aneurysm) ([place]) 01/16/2018  . Elevated brain natriuretic peptide (BNP) level 10/07/2015  . Diabetes mellitus ([place]) 10/07/2015  . Pain in the chest   . PAD (peripheral artery disease) ([place]) 07/29/2015  . Chest pain 07/04/2015  . Acute on chronic kidney failure ([place]) 07/04/2015  . GERD (gastroesophageal reflux disease) 07/04/2015  . Anemia 06/26/2015  . Coronary artery disease involving autologous vein coronary bypass graft with unstable angina pectoris ([place]) 06/24/2015  . Angina at rest [facility])   . Subclavian artery stenosis, left ([place]) 06/23/2015  . AAA (abdominal aortic aneurysm) without rupture ([place]) 12/10/2014  . Subclavian steal syndrome 12/10/2014  . Renal artery stenosis ([place]) 09/04/2013  . Carotid artery stenosis 09/28/2012  . Mitral valve disorder 06/23/2011  . S/P CABG x 3 06/16/2011  . S/P Maze operation for atrial fibrillation 06/16/2011  . S/P MVR (mitral valve repair) 06/16/2011  . Follow-up examination following surgery 03/29/2011  . CKD (chronic kidney disease) 03/03/2011  . COPD (chronic obstructive pulmonary disease) ([place]) 03/03/2011  . Chronic diastolic heart failure ([place]) 02/26/2011  . Hypotension 02/26/2011  . NSTEMI (non-ST elevated myocardial infarction) ([place]) 02/26/2011  . Long term (current) use of anticoagulants 12/17/2010  . OBSTRUCTIVE SLEEP APNEA 09/04/2010  . HYPOXEMIA 09/04/2010  . SNORING 08/05/2010  . Atrial fibrillation ([place]) 12/09/2009  . WEIGHT LOSS 03/28/2009  . PALPITATIONS 03/28/2009  . PVD 11/21/2008  . HYPERLIPIDEMIA-MIXED 11/20/2008  . TOBACCO ABUSE 11/20/2008  . Essential hypertension 11/20/2008  . Coronary artery disease involving  native heart with angina pectoris ([place]) 11/20/2008  . ABDOMINAL AORTIC ANEURYSM 11/20/2008    Past Surgical History:  Procedure Laterality Date  . A/V FISTULAGRAM Left 03/21/2018   Procedure: A/V FISTULAGRAM;  Surgeon: Serafina Mitchell, MD;  Location: [place] [facility];  Service: Cardiovascular;  Laterality: Left;  . ABDOMINAL AORTIC ENDOVASCULAR STENT GRAFT N/A 01/17/2018   Procedure: ABDOMINAL AORTIC ENDOVASCULAR STENT GRAFT WITH CO2;  Surgeon: Serafina Mitchell, MD;  Location[place];  Service: Vascular;  Laterality: N/A;  . ABDOMINAL HYSTERECTOMY  1990's  . APPENDECTOMY  Aug. 11, 2016   Ruptured  . AV FISTULA PLACEMENT Left 11/03/2017   Procedure: ARTERIOVENOUS (AV) FISTULA CREATION LEFT ARM;  Surgeon: Serafina Mitchell, MD;  Location: [place];  Service: Vascular;  Laterality: Left;  . BOWEL RESECTION  2013  . CARDIAC CATHETERIZATION N/A 06/24/2015   Procedure: Left Heart Cath and Cors/Grafts Angiography;  Surgeon: Leonie Man, MD;  Location: [place] CV LAB;  Service: Cardiovascular;  Laterality: N/A;  .  CARDIAC CATHETERIZATION N/A 06/25/2015   Procedure: Coronary Stent Intervention;  Surgeon: Sherren Mocha, MD;  Location: [place] CV LAB;  Service: Cardiovascular;  Laterality: N/A;  . CAROTID ENDARTERECTOMY Left   . CATARACT EXTRACTION Right 03/27/2018  . CHOLECYSTECTOMY N/A 04/19/2018   Procedure: LAPAROSCOPIC CHOLECYSTECTOMY WITH INTRAOPERATIVE CHOLANGIOGRAM;  Surgeon: Rolm Bookbinder, MD;  Location: [place];  Service: General;  Laterality: N/A;  . CORONARY ANGIOPLASTY    . CORONARY ARTERY BYPASS GRAFT  03/05/2011   CABG X3 (LIMA to LAD, SVG to OM, SVG to PDA, EVH via left thigh  . CORONARY STENT PLACEMENT    . ESOPHAGOGASTRODUODENOSCOPY N/A 04/12/2018   Procedure: ESOPHAGOGASTRODUODENOSCOPY (EGD);  Surgeon: [name], MD;  Location[facility] END[facility]COPY;  Service: Endoscopy;  Laterality: N/A;  . ESOPHAGOGASTRODUODENOSCOPY N/A 06/16/2018   Procedure: ESOPHAGOGASTRODUODENOSCOPY  (EGD);  Surgeon: Thornton Park, MD;  Location: [place];  Service: Gastroenterology;  Laterality: N/A;  . LACERATION REPAIR Right 1990's   WRIST  . MAZE  03/05/2011   complete biatrial lesion set with clipping of LA appendage  . MITRAL VALVE REPAIR  03/05/2011   62m Memo 3D ring annuloplasty for ischemic MR  . PERIPHERAL VASCULAR BALLOON ANGIOPLASTY Left 03/21/2018   Procedure: PERIPHERAL VASCULAR BALLOON ANGIOPLASTY;  Surgeon: BSerafina Mitchell MD;  [place];  Service: Cardiovascular;  Laterality: Left;  . PERIPHERAL VASCULAR CATHETERIZATION N/A 06/24/2015   Procedure: Aortic Arch Angiography;  Surgeon: VSerafina Mitchell MD;  [place];  Service: Cardiovascular;  Laterality: N/A;  . PERIPHERAL VASCULAR CATHETERIZATION  06/24/2015   Procedure: Peripheral Vascular Intervention;  Surgeon: VSerafina Mitchell MD;  [place];  Service: Cardiovascular;;  . PERIPHERAL VASCULAR CATHETERIZATION N/A 06/24/2015   Procedure: Abdominal Aortogram;  Surgeon: VSerafina Mitchell MD;  [place];  Service: Cardiovascular;  Laterality: N/A;  . RENAL ANGIOGRAM Bilateral 09/11/2013   Procedure: RENAL ANGIOGRAM;  Surgeon: VSerafina Mitchell MD;  [facility];  Service: Cardiovascular;  Laterality: Bilateral;  . RIGHT FEMORAL-POPLITEAL BYPASS       OB History   None      Home Medications    Prior to Admission medications   Medication Sig Start Date End Date Taking? Authorizing Provider  acetaminophen (TYLENOL) 325 MG tablet Take 325 mg by mouth every 6 (six) hours as needed (for pain).   Yes [provider]  allopurinol (ZYLOPRIM) 100 MG tablet Take 100 mg by mouth daily. 02/02/17  Yes [provider]  amLODipine (NORVASC) 10 MG tablet Take 10 mg by mouth daily.     Yes [provider]  atorvastatin (LIPITOR) 40 MG tablet Take 40 mg by mouth daily.  12/07/17  Yes [provider]  carvedilol  (COREG) 12.5 MG tablet Take 1 tablet (12.5 mg total) by mouth 2 (two) times daily with a meal. 06/26/18  Yes SThurnell Lose MD  Cholecalciferol (VITAMIN D3) 5000 units TABS Take 5,000 Units by mouth daily.   Yes [provider]  clonazePAM (KLONOPIN) 0.5 MG tablet Take 0.5 tablets (0.25 mg total) by mouth at bedtime as needed for anxiety (for insomnia). 07/19/18 07/19/19 Yes XFlorencia Reasons MD  clopidogrel (PLAVIX) 75 MG tablet Take 1 tablet (75 mg total) by mouth daily with breakfast. 06/26/15  Yes Meng, [place]  Cyanocobalamin (VITAMIN B-12) 5000 MCG SUBL Place 5,000 mcg under the tongue daily.   Yes [provider]  DULoxetine (CYMBALTA) 60 MG capsule Take 60 mg by  mouth daily.  10/26/17  Yes [provider]  feeding supplement (BOOST / RESOURCE BREEZE) LIQD Take 1 Container by mouth 2 (two) times daily.   Yes [provider]  fenofibrate 160 MG tablet Take 1 tablet (160 mg total) by mouth daily. 10/08/15  Yes Delfina Redwood, MD  ferrous sulfate 325 (65 FE) MG tablet Take 1 tablet (325 mg total) by mouth daily with breakfast. 07/19/18  Yes Florencia Reasons, MD  furosemide (LASIX) 40 MG tablet Take 1 tablet (40 mg total) by mouth 2 (two) times daily. Patient taking differently: Take 40 mg by mouth daily.  06/26/18  Yes Thurnell Lose, MD  gabapentin (NEURONTIN) 300 MG capsule TAKE 1 CAPSULE(300 MG) BY MOUTH TWICE DAILY Patient taking differently: Take 300 mg by mouth 2 (two) times daily.  05/05/18  Yes Serafina Mitchell, MD  hydrALAZINE (APRESOLINE) 25 MG tablet Take 1 tablet (25 mg total) by mouth every 8 (eight) hours. 06/26/18  Yes Thurnell Lose, MD  loperamide (IMODIUM A-D) 2 MG tablet Take 2 mg by mouth 4 (four) times daily as needed for diarrhea or loose stools.   Yes [provider]  MAGNESIUM PO Take 1 tablet by mouth daily.   Yes [provider]  oxyCODONE (OXYCONTIN) 10 mg 12 hr tablet Take 1 tablet (10 mg total) by mouth every 12  (twelve) hours. 07/19/18  Yes Florencia Reasons, MD  oxyCODONE 10 MG TABS Take 1 tablet (10 mg total) by mouth every 4 (four) hours as needed for moderate pain or severe pain. 07/19/18  Yes Florencia Reasons, MD  pantoprazole (PROTONIX) 40 MG tablet Take 1 tablet (40 mg total) by mouth 2 (two) times daily. 06/26/18  Yes Thurnell Lose, MD  POTASSIUM PO Take 1 tablet by mouth daily.   Yes [provider]  vitamin B-12 100 MCG tablet Take 1 tablet (100 mcg total) by mouth daily. 06/26/18  Yes Thurnell Lose, MD  albuterol (PROVENTIL HFA;VENTOLIN HFA) 108 (90 Base) MCG/ACT inhaler Inhale 2 puffs into the lungs every 4 (four) hours as needed for wheezing or shortness of breath. ProAir Patient not taking: Reported on 07/24/2018 10/08/15   Delfina Redwood, MD  Amino Acids-Protein Hydrolys (FEEDING SUPPLEMENT, PRO-STAT SUGAR FREE 64,) LIQD Take 30 mLs by mouth 3 (three) times daily with meals. Patient not taking: Reported on 07/24/2018 06/26/18   Thurnell Lose, MD  budesonide-formoterol [facility]) 160-4.5 MCG/ACT inhaler Inhale 2 puffs into the lungs 2 (two) times daily as needed (for flares).    [provider]  HYDROcodone-acetaminophen (NORCO/VICODIN) 5-325 MG tablet Take 1 tablet by mouth every 6 (six) hours as needed for moderate pain. Patient not taking: Reported on 07/24/2018 06/26/18   Thurnell Lose, MD  isosorbide mononitrate (IMDUR) 30 MG 24 hr tablet Take 0.5 tablets (15 mg total) by mouth daily. Patient not taking: Reported on 07/24/2018 12/02/15   Sherren Mocha, MD  [facility] 4 MG/0.1ML LIQD nasal spray kit Place 1 spray into the nose once.  07/21/18   [provider]  polyethylene glycol (MIRALAX / GLYCOLAX) packet Take 17 g by mouth daily as needed for mild constipation. 06/26/18   Thurnell Lose, MD  spironolactone (ALDACTONE) 25 MG tablet Take 1 tablet (25 mg total) by mouth daily. Patient not taking: Reported on 07/24/2018 06/26/18   Thurnell Lose, MD    XTAMPZA ER 9 MG C12A Take 9 mg by mouth daily.  07/21/18   [provider]  Family History Family History  Problem Relation Age of Onset  . Emphysema Mother   . Heart disease Mother        before age 36  . Hypertension Mother   . Hyperlipidemia Mother   . Heart attack Mother   . AAA (abdominal aortic aneurysm) Mother        rupture  . Heart attack Father 48  . Emphysema Father   . Heart disease Father        before age 60  . Hyperlipidemia Father   . Hypertension Father   . Peripheral vascular disease Father   . Diabetes Brother   . Heart disease Brother        before age 68  . Hyperlipidemia Brother   . Hypertension Brother   . Heart attack Brother        CABG  . Heart disease Other        Vascular disease in grandparents, uncles and dad  . Colon cancer Neg Hx     Social History Social History   Tobacco Use  . Smoking status: Current Every Day Smoker    Years: 50.00    Types: Cigarettes  . Smokeless tobacco: Never Used  . Tobacco comment: 3/4 pk per day  Substance Use Topics  . Alcohol use: Yes    Alcohol/week: 0.0 standard drinks    Comment: occasionally  . Drug use: Yes    Types: Marijuana     Allergies   Isosorbide; Isosorbide nitrate; Oxycodone-acetaminophen; Codeine; Contrast media [iodinated diagnostic agents]; Ioxaglate; Metrizamide; and Percocet [oxycodone-acetaminophen]   Review of Systems Review of Systems  All other systems reviewed and are negative.    Physical Exam Updated Vital Signs BP (!) 124/52   Pulse (!) 54   Temp 98.2 F (36.8 C) (Oral)   Resp 13   Ht 1.651 m ('5\' 5"'$ )   Wt 61.2 kg   SpO2 94%   BMI 22.45 kg/m   Physical Exam  Constitutional: She appears lethargic. No distress.  Appears older than states age  HENT:  Head: Normocephalic and atraumatic.  Right Ear: External ear normal.  Left Ear: External ear normal.  Eyes: Conjunctivae are normal. Right eye exhibits no discharge. Left eye exhibits no  discharge. No scleral icterus.  Neck: Neck supple. No tracheal deviation present.  Cardiovascular: Normal rate, regular rhythm and intact distal pulses.  Pulmonary/Chest: Effort normal and breath sounds normal. No stridor. No respiratory distress. She has no wheezes. She has no rales.  Abdominal: Soft. Bowel sounds are normal. She exhibits no distension. There is no tenderness. There is no rebound and no guarding.  Musculoskeletal: She exhibits no edema or tenderness.  Neurological: She appears lethargic. She is not disoriented. No cranial nerve deficit (no facial droop, extraocular movements intact, no slurred speech) or sensory deficit. She exhibits normal muscle tone. She displays no seizure activity. Coordination normal. GCS eye subscore is 3. GCS verbal subscore is 5. GCS motor subscore is 6.  Opens eyes and answers questions when I speak loudly to her, otherwise falls back asleep.  Moves all extremities, no facial droop,   Skin: Skin is warm and dry. No rash noted.  Psychiatric: She has a normal mood and affect.  Nursing note and vitals reviewed.    ED Treatments / Results  Labs (all labs ordered are listed, but only abnormal results are displayed) Labs Reviewed  COMPREHENSIVE METABOLIC PANEL - Abnormal; Notable for the following components:      Result Value  Potassium 2.7 (*)    Glucose, Bld 153 (*)    BUN 84 (*)    Creatinine, Ser 3.90 (*)    Calcium 8.6 (*)    Total Protein 6.1 (*)    Albumin 3.0 (*)    GFR calc non Af Amer 11 (*)    GFR calc Af Amer 13 (*)    All other components within normal limits  CBC - Abnormal; Notable for the following components:   RBC 2.71 (*)    Hemoglobin 7.9 (*)    HCT 26.4 (*)    MCHC 29.9 (*)    RDW 20.6 (*)    All other components within normal limits  AMMONIA - Abnormal; Notable for the following components:   Ammonia 41 (*)    All other components within normal limits  BLOOD GAS, ARTERIAL - Abnormal; Notable for the following  components:   pO2, Arterial 68.0 (*)    Acid-base deficit 2.5 (*)    All other components within normal limits  MAGNESIUM  ETHANOL  RAPID URINE DRUG SCREEN, HOSP PERFORMED  URINALYSIS, ROUTINE W REFLEX MICROSCOPIC  CBG MONITORING, ED    EKG EKG Interpretation  Date/Time:  Monday July 24 2018 19:38:14 EST Ventricular Rate:  56 PR Interval:    QRS Duration: 135 QT Interval:  538 QTC Calculation: 520 R Axis:   54 Text Interpretation:  Sinus rhythm Right bundle branch block Nonspecific T abnormalities, lateral leads No significant change since last tracing Confirmed by Dorie Rank [phone]) on 07/24/2018 7:58:15 PM   Radiology Ct Head Wo Contrast  Result Date: 07/24/2018 CLINICAL DATA:  Altered mental status, somnolent. History of anemia, diabetes, pancreatitis. EXAM: CT HEAD WITHOUT CONTRAST TECHNIQUE: Contiguous axial images were obtained from the base of the skull through the vertex without intravenous contrast. COMPARISON:  CT HEAD June 30, 2018 FINDINGS: BRAIN: No intraparenchymal hemorrhage, mass effect nor midline shift. Mild parenchymal brain volume loss. Patchy supratentorial white matter hypodensities within normal range for patient's age, though non-specific are most compatible with chronic small vessel ischemic disease. No acute large vascular territory infarcts. No abnormal extra-axial fluid collections. Basal cisterns are patent. VASCULAR: Severe calcific atherosclerosis of the carotid siphons. SKULL: No skull fracture. No significant scalp soft tissue swelling. SINUSES/ORBITS: Trace paranasal sinus mucosal thickening. Mastoid air cells are well aerated.The included ocular globes and orbital contents are non-suspicious. OTHER: None. IMPRESSION: 1. No acute intracranial process. 2. Mild parenchymal brain volume loss. 3. Mild chronic small vessel ischemic changes. Severe intracranial atherosclerosis. Electronically Signed   By: Elon Alas M.D.   On: 07/24/2018 20:33    Dg Chest Portable 1 View  Result Date: 07/24/2018 CLINICAL DATA:  Altered level of consciousness. EXAM: PORTABLE CHEST 1 VIEW COMPARISON:  07/08/2018 FINDINGS: Previous median sternotomy. Aortic atherosclerosis. Mild cardiac enlargement. Moderate diffuse pulmonary edema is similar to previous exam. No focal airspace opacities. IMPRESSION: 1. Cardiac enlargement and pulmonary edema. 2.  Aortic Atherosclerosis (ICD10-I70.0). Electronically Signed   By: Kerby Moors M.D.   On: 07/24/2018 20:38    Procedures .Critical Care Performed by: Dorie Rank, MD Authorized by: Dorie Rank, MD   Critical care provider statement:    Critical care time (minutes):  30   Critical care was time spent personally by me on the following activities:  Discussions with consultants, evaluation of patient's response to treatment, examination of patient, ordering and performing treatments and interventions, ordering and review of laboratory studies, ordering and review of radiographic studies, pulse oximetry, re-evaluation of patient's  condition, obtaining history from patient or surrogate and review of old charts   (including critical care time)  Medications Ordered in ED Medications  potassium chloride 10 mEq in 100 mL IVPB (has no administration in time range)  sodium chloride 0.9 % bolus 250 mL (has no administration in time range)  potassium chloride 10 mEq in 100 mL IVPB (0 mEq Intravenous Stopped 07/24/18 2119)     Initial Impression / Assessment and Plan / ED Course  I have reviewed the triage vital signs and the nursing notes.  Pertinent labs & imaging results that were available during my care of the patient were reviewed by me and considered in my medical decision making (see chart for details).  Clinical Course as of Jul 24 2150  Mon Jul 24, 2018  1958 K low at 2.7   [JK]  2147 BUN and creatinine are elevated.  Hemoglobin is low but stable compared to previous values   [JK]  2147 No acute  findings on head CT   [JK]  2147 Chest x-ray shows pulmonary edema noted on previous trace   [JK]    Clinical Course User Index [JK] Dorie Rank, MD    Patient presented to the emergency room for evaluation of altered mental status.  Patient's labs are notable for a low potassium and an elevated BUN and creatinine.  Patient is anemic but this is stable.  Chest x-ray also shows evidence of pulmonary edema however the patient denies any respiratory difficulty.  This appears to be a chronic finding from her previous chest x-ray.  Will need to monitor closely but I do think she would benefit from a small fluid bolus considering her renal function.  Mental status may be related to her hypokalemia and her uremia.  Plan on admission the hospital for further treatment and evaluation.  Final Clinical Impressions(s) / ED Diagnoses   Final diagnoses:  Hypokalemia  Lethargy  AKI (acute kidney injury) ([place])      Dorie Rank, MD 07/24/18 2153 Cc addendum   Dorie Rank, MD 08/02/18 2114

## 2018-07-24 NOTE — ED Notes (Signed)
Date and time results received: 07/24/18 1908 (use smartphrase ".now" to insert current time)  Test: potassium Critical Value: 2.7  Name of Provider Notified: Dr. Ralene Bathe  Orders Received? Or Actions Taken?: Actions Taken: notified Dr. Ralene Bathe

## 2018-07-24 NOTE — ED Triage Notes (Addendum)
Pt to ed by [facility] with complaints of drowsiness. Pt is from home and lives with her daughter, daughter states that shes been more sleepy and if not interacting directly with pt she is sleeping. Pt daughter states last time pt presented like this, pt had anemia.  Pt states she does feel sleepy and loss of appetite but no pain or discomfort. Pt recently given OxyContin and taking up until 5 pm yesterday.  Pt is A & O 4. Pt is ambulatory with assistance.

## 2018-07-24 NOTE — ED Notes (Addendum)
Spoke to patient placement, pt will be holding in the ED tonight. Daughter [name] made aware

## 2018-07-25 DIAGNOSIS — T502X5A Adverse effect of carbonic-anhydrase inhibitors, benzothiadiazides and other diuretics, initial encounter: Secondary | ICD-10-CM | POA: Diagnosis not present

## 2018-07-25 DIAGNOSIS — F17210 Nicotine dependence, cigarettes, uncomplicated: Secondary | ICD-10-CM | POA: Diagnosis not present

## 2018-07-25 DIAGNOSIS — K861 Other chronic pancreatitis: Secondary | ICD-10-CM | POA: Diagnosis not present

## 2018-07-25 DIAGNOSIS — X58XXXA Exposure to other specified factors, initial encounter: Secondary | ICD-10-CM | POA: Diagnosis not present

## 2018-07-25 DIAGNOSIS — S92515A Nondisplaced fracture of proximal phalanx of left lesser toe(s), initial encounter for closed fracture: Secondary | ICD-10-CM | POA: Diagnosis not present

## 2018-07-25 DIAGNOSIS — R001 Bradycardia, unspecified: Secondary | ICD-10-CM | POA: Diagnosis not present

## 2018-07-25 DIAGNOSIS — G9340 Encephalopathy, unspecified: Secondary | ICD-10-CM | POA: Diagnosis not present

## 2018-07-25 DIAGNOSIS — K863 Pseudocyst of pancreas: Secondary | ICD-10-CM | POA: Diagnosis not present

## 2018-07-25 DIAGNOSIS — Z951 Presence of aortocoronary bypass graft: Secondary | ICD-10-CM | POA: Diagnosis not present

## 2018-07-25 DIAGNOSIS — E876 Hypokalemia: Secondary | ICD-10-CM | POA: Diagnosis present

## 2018-07-25 DIAGNOSIS — Z7902 Long term (current) use of antithrombotics/antiplatelets: Secondary | ICD-10-CM | POA: Diagnosis not present

## 2018-07-25 DIAGNOSIS — K859 Acute pancreatitis without necrosis or infection, unspecified: Secondary | ICD-10-CM | POA: Diagnosis not present

## 2018-07-25 DIAGNOSIS — I5033 Acute on chronic diastolic (congestive) heart failure: Secondary | ICD-10-CM | POA: Diagnosis not present

## 2018-07-25 DIAGNOSIS — I2510 Atherosclerotic heart disease of native coronary artery without angina pectoris: Secondary | ICD-10-CM | POA: Diagnosis not present

## 2018-07-25 DIAGNOSIS — J449 Chronic obstructive pulmonary disease, unspecified: Secondary | ICD-10-CM | POA: Diagnosis not present

## 2018-07-25 DIAGNOSIS — E440 Moderate protein-calorie malnutrition: Secondary | ICD-10-CM | POA: Diagnosis not present

## 2018-07-25 DIAGNOSIS — Z9049 Acquired absence of other specified parts of digestive tract: Secondary | ICD-10-CM | POA: Diagnosis not present

## 2018-07-25 DIAGNOSIS — T402X5A Adverse effect of other opioids, initial encounter: Secondary | ICD-10-CM | POA: Diagnosis not present

## 2018-07-25 DIAGNOSIS — T424X5A Adverse effect of benzodiazepines, initial encounter: Secondary | ICD-10-CM | POA: Diagnosis not present

## 2018-07-25 DIAGNOSIS — G92 Toxic encephalopathy: Secondary | ICD-10-CM | POA: Diagnosis not present

## 2018-07-25 DIAGNOSIS — G894 Chronic pain syndrome: Secondary | ICD-10-CM | POA: Diagnosis not present

## 2018-07-25 DIAGNOSIS — N179 Acute kidney failure, unspecified: Secondary | ICD-10-CM | POA: Diagnosis not present

## 2018-07-25 DIAGNOSIS — D631 Anemia in chronic kidney disease: Secondary | ICD-10-CM | POA: Diagnosis not present

## 2018-07-25 DIAGNOSIS — I132 Hypertensive heart and chronic kidney disease with heart failure and with stage 5 chronic kidney disease, or end stage renal disease: Secondary | ICD-10-CM | POA: Diagnosis not present

## 2018-07-25 DIAGNOSIS — J9601 Acute respiratory failure with hypoxia: Secondary | ICD-10-CM | POA: Diagnosis not present

## 2018-07-25 DIAGNOSIS — Z955 Presence of coronary angioplasty implant and graft: Secondary | ICD-10-CM | POA: Diagnosis not present

## 2018-07-25 LAB — RAPID URINE DRUG SCREEN, HOSP PERFORMED
Amphetamines: NOT DETECTED
BENZODIAZEPINES: NOT DETECTED
Barbiturates: NOT DETECTED
COCAINE: NOT DETECTED
Opiates: POSITIVE — AB
Tetrahydrocannabinol: NOT DETECTED

## 2018-07-25 LAB — BASIC METABOLIC PANEL
ANION GAP: 14 (ref 5–15)
Anion gap: 15 (ref 5–15)
BUN: 79 mg/dL — AB (ref 8–23)
BUN: 72 mg/dL — ABNORMAL HIGH (ref 8–23)
CALCIUM: 8.6 mg/dL — AB (ref 8.9–10.3)
CHLORIDE: 105 mmol/L (ref 98–111)
CO2: 20 mmol/L — ABNORMAL LOW (ref 22–32)
CO2: 18 mmol/L — ABNORMAL LOW (ref 22–32)
Calcium: 8.5 mg/dL — ABNORMAL LOW (ref 8.9–10.3)
Chloride: 107 mmol/L (ref 98–111)
Creatinine, Ser: 3.73 mg/dL — ABNORMAL HIGH (ref 0.44–1.00)
Creatinine, Ser: 3.97 mg/dL — ABNORMAL HIGH (ref 0.44–1.00)
GFR, EST AFRICAN AMERICAN: 14 mL/min — AB (ref >60)
GFR, EST AFRICAN AMERICAN: 13 mL/min — AB (ref >60)
GFR, EST NON AFRICAN AMERICAN: 12 mL/min — AB (ref >60)
GFR, EST NON AFRICAN AMERICAN: 11 mL/min — AB (ref >60)
GLUCOSE: 116 mg/dL — AB (ref 70–99)
Glucose, Bld: 112 mg/dL — ABNORMAL HIGH (ref 70–99)
POTASSIUM: 3.1 mmol/L — AB (ref 3.5–5.1)
POTASSIUM: 3.3 mmol/L — AB (ref 3.5–5.1)
Sodium: 140 mmol/L (ref 135–145)
Sodium: 139 mmol/L (ref 135–145)

## 2018-07-25 LAB — VITAMIN B12: VITAMIN B 12: 1134 pg/mL — AB (ref 180–914)

## 2018-07-25 LAB — URINALYSIS, ROUTINE W REFLEX MICROSCOPIC
BILIRUBIN URINE: NEGATIVE
Glucose, UA: NEGATIVE mg/dL
Hgb urine dipstick: NEGATIVE
KETONES UR: NEGATIVE mg/dL
Nitrite: NEGATIVE
PH: 5 (ref 5.0–8.0)
Protein, ur: NEGATIVE mg/dL
SPECIFIC GRAVITY, URINE: 1.008 (ref 1.005–1.030)

## 2018-07-25 LAB — IRON AND TIBC
IRON: 35 ug/dL (ref 28–170)
SATURATION RATIOS: 9 % — AB (ref 10.4–31.8)
TIBC: 375 ug/dL (ref 250–450)
UIBC: 340 ug/dL

## 2018-07-25 LAB — SODIUM, URINE, RANDOM: SODIUM UR: 19 mmol/L

## 2018-07-25 LAB — CREATININE, URINE, RANDOM: Creatinine, Urine: 65.91 mg/dL

## 2018-07-25 LAB — FERRITIN: Ferritin: 1240 ng/mL — ABNORMAL HIGH (ref 11–307)

## 2018-07-25 LAB — TSH: TSH: 3.376 u[IU]/mL (ref 0.350–4.500)

## 2018-07-25 LAB — AMMONIA: AMMONIA: 19 umol/L (ref 9–35)

## 2018-07-25 MED ORDER — NALOXONE HCL 0.4 MG/ML IJ SOLN: 0.4000 mg | INTRAMUSCULAR | Status: DC | PRN

## 2018-07-25 MED ORDER — HEPARIN SODIUM (PORCINE) 5000 UNIT/ML IJ SOLN
5000.0000 [IU] | Freq: Three times a day (TID) | INTRAMUSCULAR | Status: DC
  Administered 2018-07-25 – 2018-08-03 (×19): 5000 [IU] via SUBCUTANEOUS
  Filled 2018-07-25 (×21): qty 1

## 2018-07-25 MED ORDER — SODIUM CHLORIDE 0.9 % IV SOLN
INTRAVENOUS | Status: AC
  Administered 2018-07-25 – 2018-07-26 (×2): via INTRAVENOUS

## 2018-07-25 NOTE — ED Notes (Signed)
Family notified of patient's departure

## 2018-07-25 NOTE — Consult Note (Addendum)
Reason for Consult: Acute kidney injury on chronic kidney disease stage IV Referring Physician: Shelly Coss M.D. [facility])  HPI:  67 year old Caucasian woman who I follow as an outpatient for chronic kidney disease stage IV-V from underlying hypertension/ischemic nephropathy, also with a past medical history significant for peripheral vascular disease including AAA, carotid disease and bilateral renal artery stents, history of CAD status post CABG, history of chronic back pain, chronic obstructive lung disease, anemia of chronic kidney disease and chronic pancreatitis status post recent prolonged hospitalization demarcated by pain management and an episode of acute on chronic renal insufficiency.  Brought to the emergency room at [facility] yesterday after developing increasing lethargy/altered level of consciousness with preceding use of narcotic analgesics.  Unfortunately because of her somnolence, her oral intake was suboptimal but she continued to take her prescribed medications including furosemide.  She denies any nausea, vomiting or diarrhea and effect has some constipation.  She denies any pain at this time.  She has tremors/myoclonic jerks that are unchanged and present even when her creatinine was 2.3 (GFR 21).  She denies any dysgeusia, pruritus or chest pain/chest pressure.  She has had recent changes in sleep/wake pattern that appears to be associated with her Klonopin/narcotic analgesic (OxyContin, oxycodone) use in addition to gabapentin 300 mg twice daily, Cymbalta 60 mg daily.  In the emergency room yesterday, noted to have an elevated creatinine of 3.9 that improved minimally to 3.7 after some intravenous fluids overnight.  Charting shows she is 0.3 L positive.  She denies any dysuria, flank pain, fever or chills.  Denies any cough or sputum production.  Past Medical History:  Diagnosis Date  . AAA (abdominal aortic aneurysm) ([place]) 5/09   3.7x3.3 by u/s 2009  . Abnormal EKG     deep TW inversions chronic  . Anemia   . CAD (coronary artery disease)    a. CABG 03/2011 LIMA to LAD, SVG to OM, SVG to PDA. b. cath 06/25/2015 DES to SVG to rPDA, patent LIMA to LAD, patent SVG to OM  . carotid stenosis 5/09   S/p L CEA ;  60-79% bilat ICA by preCABG dopplers 7/12  . CHF (congestive heart failure) ([place])   . Chronic back pain    "all over" (06/24/2015)  . Chronic bronchitis ([place])    "get it pretty much q yr" (06/24/2015)  . CKD (chronic kidney disease)   . Complication of anesthesia 1966   "problem w/ether"  . COPD (chronic obstructive pulmonary disease) ([place])   . Depression   . GERD (gastroesophageal reflux disease)    With hiatal hernia  . GIB (gastrointestinal bleeding) 9/11   S/p EGD with cautery at [facility]  . Heart murmur   . History of blood transfusion "a few times"   "all related to anemia" (06/24/2015)  . History of hiatal hernia   . Hyperlipidemia   . Hypertension   . Mitral regurgitation    3+ MR by intraoperative TEE;  s/p MV repair with Dr. Roxy Manns 7/12  . Myocardial infarction [facility]) 2012 "several"  . On home oxygen therapy    "2 liters at night; negative for sleep apnea"  . PAD (peripheral artery disease) (HCC)    Severe; s/p bilateral renal artery stents, moderate in-stent restenosis  . Pancreatitis 05/2018  . Paroxysmal atrial fibrillation (HCC)    coumadin;  echo 9/07: EF 60%, mild LVH;  s/p Cox Maze 7/12 with LAA clipping  . Subclavian artery stenosis, left (HCC)    stented by  Dr. Trula Slade on 10/18 to help flow of her LIMA to LAD  . Type II diabetes mellitus ([place])     Past Surgical History:  Procedure Laterality Date  . A/V FISTULAGRAM Left 03/21/2018   Procedure: A/V FISTULAGRAM;  Surgeon: Serafina Mitchell, MD;  Location: [place] [facility];  Service: Cardiovascular;  Laterality: Left;  . ABDOMINAL AORTIC ENDOVASCULAR STENT GRAFT N/A 01/17/2018   Procedure: ABDOMINAL AORTIC ENDOVASCULAR STENT GRAFT WITH CO2;  Surgeon: Serafina Mitchell, MD;   Location: [place];  Service: Vascular;  Laterality: N/A;  . ABDOMINAL HYSTERECTOMY  1990's  . APPENDECTOMY  Aug. 11, 2016   Ruptured  . AV FISTULA PLACEMENT Left 11/03/2017   Procedure: ARTERIOVENOUS (AV) FISTULA CREATION LEFT ARM;  Surgeon: Serafina Mitchell, MD;  Location: [place];  Service: Vascular;  Laterality: Left;  . BOWEL RESECTION  2013  . CARDIAC CATHETERIZATION N/A 06/24/2015   Procedure: Left Heart Cath and Cors/Grafts Angiography;  Surgeon: Leonie Man, MD;  Location: [place] [facility];  Service: Cardiovascular;  Laterality: N/A;  . CARDIAC CATHETERIZATION N/A 06/25/2015   Procedure: Coronary Stent Intervention;  Surgeon: Sherren Mocha, MD;  Location: [place] [facility];  Service: Cardiovascular;  Laterality: N/A;  . CAROTID ENDARTERECTOMY Left   . CATARACT EXTRACTION Right 03/27/2018  . CHOLECYSTECTOMY N/A 04/19/2018   Procedure: LAPAROSCOPIC CHOLECYSTECTOMY WITH INTRAOPERATIVE CHOLANGIOGRAM;  Surgeon: Rolm Bookbinder, MD;  Location: [place];  Service: General;  Laterality: N/A;  . CORONARY ANGIOPLASTY    . CORONARY ARTERY BYPASS GRAFT  03/05/2011   CABG X3 (LIMA to LAD, SVG to OM, SVG to PDA, EVH via left thigh  . CORONARY STENT PLACEMENT    . ESOPHAGOGASTRODUODENOSCOPY N/A 04/12/2018   Procedure: ESOPHAGOGASTRODUODENOSCOPY (EGD);  Surgeon: [name], MD;  Location: [facility] ENDOSCOPY;  Service: Endoscopy;  Laterality: N/A;  . ESOPHAGOGASTRODUODENOSCOPY N/A 06/16/2018   Procedure: ESOPHAGOGASTRODUODENOSCOPY (EGD);  Surgeon: Thornton Park, MD;  Location: [place];  Service: Gastroenterology;  Laterality: N/A;  . LACERATION REPAIR Right 1990's   WRIST  . MAZE  03/05/2011   complete biatrial lesion set with clipping of LA appendage  . MITRAL VALVE REPAIR  03/05/2011   76mm Memo 3D ring annuloplasty for ischemic MR  . PERIPHERAL VASCULAR BALLOON ANGIOPLASTY Left 03/21/2018   Procedure: PERIPHERAL VASCULAR BALLOON ANGIOPLASTY;  Surgeon: Serafina Mitchell, MD;  Location: [place] [facility];  Service: Cardiovascular;  Laterality: Left;  . PERIPHERAL VASCULAR CATHETERIZATION N/A 06/24/2015   Procedure: Aortic Arch Angiography;  Surgeon: Serafina Mitchell, MD;  Location: [place] [facility];  Service: Cardiovascular;  Laterality: N/A;  . PERIPHERAL VASCULAR CATHETERIZATION  06/24/2015   Procedure: Peripheral Vascular Intervention;  Surgeon: Serafina Mitchell, MD;  Location: [place] [facility];  Service: Cardiovascular;;  . PERIPHERAL VASCULAR CATHETERIZATION N/A 06/24/2015   Procedure: Abdominal Aortogram;  Surgeon: Serafina Mitchell, MD;  Location: [place] [facility];  Service: Cardiovascular;  Laterality: N/A;  . RENAL ANGIOGRAM Bilateral 09/11/2013   Procedure: RENAL ANGIOGRAM;  Surgeon: Serafina Mitchell, MD;  Location: [facility] CATH LAB;  Service: Cardiovascular;  Laterality: Bilateral;  . RIGHT FEMORAL-POPLITEAL BYPASS      Family History  Problem Relation Age of Onset  . Emphysema Mother   . Heart disease Mother        before age 25  . Hypertension Mother   . Hyperlipidemia Mother   . Heart attack Mother   . AAA (abdominal aortic aneurysm) Mother  rupture  . Heart attack Father 65  . Emphysema Father   . Heart disease Father        before age 57  . Hyperlipidemia Father   . Hypertension Father   . Peripheral vascular disease Father   . Diabetes Brother   . Heart disease Brother        before age 47  . Hyperlipidemia Brother   . Hypertension Brother   . Heart attack Brother        CABG  . Heart disease Other        Vascular disease in grandparents, uncles and dad  . Colon cancer Neg Hx     Social History:  reports that she has been smoking cigarettes. She has smoked for the past 50.00 years. She has never used smokeless tobacco. She reports that she drinks alcohol. She reports that she has current or past drug history. Drug: Marijuana.  Allergies:  Allergies  Allergen Reactions  . Isosorbide Other (See Comments)    Can only tolerate in low doses   . Isosorbide Nitrate Other (See Comments)    Can only tolerate in low doses  . Oxycodone-Acetaminophen Itching    Tolerates Hydrocodone.  Also tolerated oxycodone 07/13/18 admission.    . Codeine Itching  . Contrast Media [Iodinated Diagnostic Agents] Itching and Other (See Comments)    Itching of feet  . Ioxaglate Itching and Other (See Comments)    Itching of feet  . Metrizamide Itching and Other (See Comments)    Itching of feet  . Percocet [Oxycodone-Acetaminophen] Itching and Other (See Comments)    Tolerates Hydrocodone    Medications:  Scheduled: . allopurinol  100 mg Oral Daily  . amLODipine  10 mg Oral Daily  . atorvastatin  40 mg Oral q1800  . carvedilol  12.5 mg Oral BID WC  . clopidogrel  75 mg Oral Q breakfast  . feeding supplement  1 Container Oral BID  . gabapentin  300 mg Oral Daily  . heparin  5,000 Units Subcutaneous Q8H  . hydrALAZINE  25 mg Oral Q8H  . mometasone-formoterol  2 puff Inhalation BID  . pantoprazole  40 mg Oral BID  . sodium chloride flush  3 mL Intravenous Q12H  . vitamin B-12  5,000 mcg Oral Daily    BMP Latest Ref Rng & Units 07/25/2018 07/24/2018 07/19/2018  Glucose 70 - 99 mg/dL 112(H) 153(H) -  BUN 8 - 23 mg/dL 79(H) 84(H) -  Creatinine 0.44 - 1.00 mg/dL 3.73(H) 3.90(H) 2.34(H)  Sodium 135 - 145 mmol/L 140 137 -  Potassium 3.5 - 5.1 mmol/L 3.1(L) 2.7(LL) -  Chloride 98 - 111 mmol/L 105 100 -  CO2 22 - 32 mmol/L 20(L) 23 -  Calcium 8.9 - 10.3 mg/dL 8.5(L) 8.6(L) -   CBC Latest Ref Rng & Units 07/24/2018 07/15/2018 07/13/2018  WBC 4.0 - 10.5 K/uL 8.7 8.0 9.2  Hemoglobin 12.0 - 15.0 g/dL 7.9(L) 7.3(L) 8.1(L)  Hematocrit 36.0 - 46.0 % 26.4(L) 24.6(L) 27.2(L)  Platelets 150 - 400 K/uL 337 261 287   Urinalysis    Component Value Date/Time   COLORURINE YELLOW 07/25/2018 0530   APPEARANCEUR CLEAR 07/25/2018 0530   LABSPEC 1.008 07/25/2018 0530   PHURINE 5.0 07/25/2018 0530   GLUCOSEU NEGATIVE 07/25/2018 0530   HGBUR NEGATIVE  07/25/2018 0530   BILIRUBINUR NEGATIVE 07/25/2018 0530   KETONESUR NEGATIVE 07/25/2018 0530   PROTEINUR NEGATIVE 07/25/2018 0530   UROBILINOGEN 0.2 03/04/2011 2107   NITRITE NEGATIVE 07/25/2018 0530  LEUKOCYTESUR TRACE (A) 07/25/2018 0530     Ct Head Wo Contrast  Result Date: 07/24/2018 CLINICAL DATA:  Altered mental status, somnolent. History of anemia, diabetes, pancreatitis. EXAM: CT HEAD WITHOUT CONTRAST TECHNIQUE: Contiguous axial images were obtained from the base of the skull through the vertex without intravenous contrast. COMPARISON:  CT HEAD June 30, 2018 FINDINGS: BRAIN: No intraparenchymal hemorrhage, mass effect nor midline shift. Mild parenchymal brain volume loss. Patchy supratentorial white matter hypodensities within normal range for patient's age, though non-specific are most compatible with chronic small vessel ischemic disease. No acute large vascular territory infarcts. No abnormal extra-axial fluid collections. Basal cisterns are patent. VASCULAR: Severe calcific atherosclerosis of the carotid siphons. SKULL: No skull fracture. No significant scalp soft tissue swelling. SINUSES/ORBITS: Trace paranasal sinus mucosal thickening. Mastoid air cells are well aerated.The included ocular globes and orbital contents are non-suspicious. OTHER: None. IMPRESSION: 1. No acute intracranial process. 2. Mild parenchymal brain volume loss. 3. Mild chronic small vessel ischemic changes. Severe intracranial atherosclerosis. Electronically Signed   By: Elon Alas M.D.   On: 07/24/2018 20:33   Dg Chest Portable 1 View  Result Date: 07/24/2018 CLINICAL DATA:  Altered level of consciousness. EXAM: PORTABLE CHEST 1 VIEW COMPARISON:  07/08/2018 FINDINGS: Previous median sternotomy. Aortic atherosclerosis. Mild cardiac enlargement. Moderate diffuse pulmonary edema is similar to previous exam. No focal airspace opacities. IMPRESSION: 1. Cardiac enlargement and pulmonary edema. 2.  Aortic  Atherosclerosis (ICD10-I70.0). Electronically Signed   By: Kerby Moors M.D.   On: 07/24/2018 20:38    Review of Systems  Constitutional: Positive for malaise/fatigue. Negative for chills and fever.  HENT: Negative.   Eyes: Negative.   Respiratory: Negative.   Cardiovascular: Negative.   Gastrointestinal: Negative.   Genitourinary: Negative.   Musculoskeletal: Positive for back pain.       Intermittent musculoskeletal back pain  Skin: Negative.   Neurological: Positive for tremors and weakness. Negative for headaches.   Blood pressure (!) 142/61, pulse 66, temperature 98 F (36.7 C), temperature source Oral, resp. rate 18, height 5\' 6"  (1.676 m), weight 58.3 kg, SpO2 96 %. Physical Exam  Nursing note and vitals reviewed. Constitutional: She appears well-developed and well-nourished. No distress.  HENT:  Head: Normocephalic.  Nose: Nose normal.  Mouth/Throat: No oropharyngeal exudate.  Dry oral mucosa/oropharynx  Eyes: Conjunctivae and EOM are normal. No scleral icterus.  Bilateral dilated pupils noted  Neck: Normal range of motion. Neck supple. No JVD present. No tracheal deviation present.  Cardiovascular: Normal rate, regular rhythm and normal heart sounds.  No murmur heard. Respiratory: Effort normal and breath sounds normal. She has no wheezes. She has no rales.  GI: Soft. Bowel sounds are normal. There is no tenderness. There is no rebound.  Musculoskeletal: She exhibits edema.  1+ pitting edema pretibial.  Left upper arm AV fistula with fair thrill.  Neurological:  Somnolent but arouses and is oriented appropriately to place and person.  Needs some redirection with regards to time.  Intermittent myoclonic jerks noted  Skin: Skin is warm and dry.    Assessment/Plan: 1.  Acute kidney injury on chronic kidney disease stage IV: This is currently suspected to be hemodynamically mediated with her altered mentation and decreased oral intake in the setting of ongoing diuretic  use.  I agree with gentle intravenous fluid resuscitation and reevaluation of labs.  Her myoclonic jerks are somewhat chronic and preceded her recent decline of renal function.  I do not see any hard indications for initiation of  hemodialysis at this time. 2.  Altered level of consciousness: This appears to be primarily from polypharmacy with multiple agents likely contributing to her recent somnolence/decreased responsiveness.  Will treat with naloxone. 3.  Hypertension: Hold diuretics at this time and monitor on current antihypertensive therapy. 4.  Hypokalemia: Secondary to diuretic induced losses with decreased oral intake, agree with replacement via oral route. 5.  Anemia: Unfortunately rehospitalized before she was able to establish her visits again with short stay for ESA/iron therapy.  I will check iron studies today. 6.  Chronic protein calorie malnutrition/deconditioning: Secondary to chronic pancreatitis and recent prolonged hospitalization-agree with continued nutritional supplements and reevaluation by PT/OT for outpatient needs.  Tamicka Shimon K. 07/25/2018, 4:28 PM

## 2018-07-25 NOTE — Progress Notes (Signed)
PROGRESS NOTE    [name]  PZW:[id number] DOB: [birth year] DOA: 07/24/2018 PCP: Bernerd Limbo, MD   Brief Narrative: Patient is a 67 year old female with past medical history of abdominal aortic aneurysm status post endovascular repair, chronic kidney disease stage IV, coronary disease, pancreatic pseudocyst, diastolic heart failure, chronic pain syndrome who presented to the emergency department for the evaluation of somnolence.  Patient was reported to be sleepy, having difficulty staying awake, increased fatigue.  She was found to be hemodynamically stable on presentation.  Found to have acute kidney injury and CKD.  Admitted for ongoing evaluation and  Management. She was just discharged from here on 07/19/2018.  Assessment & Plan:   Principal Problem:   Acute encephalopathy Active Problems:   Essential hypertension   Anemia   Acute renal failure superimposed on stage 4 chronic kidney disease (HCC)   CAD (coronary artery disease)   Chronic diastolic congestive heart failure (HCC)   Hypokalemia   Chronic pain  Acute encephalopathy/altered mental status: CT head did not show any acute intracranial abnormalities.  No focal neurologic deficits identified.  Her mental status could be associated with uremia her narcotics. Her mental status looks to have slightly improved this morning.  She is alert .  She could tell where she is and she could tell the name of the current month.  Acute kidney injury on CKD stage IV: Slightly improved with IV fluids.  Baseline creatinine of 2.3.  Follows with chronic kidney, Dr. Posey Pronto.  We will continue gentle IV fluids.  Try to avoid nephrotoxins.  We will continue to monitor her BMP.  Hypokalemia: Supplemented.  Anemia: Hemoglobin of 7.9, appears to be stable around his baseline.  No active bleeding.  Continue vitamin B12 and iron supplementation.  Chronic pain syndrome: On oxycodone at home.  Will hold pain medications.  We will also hold Cymbalta  given somnolence.  We will hold clonazepam  Coronary disease: Currently stable.  No chest pain.  Continue Plavix, statin, beta-blocker.s/p CABG, DES placement in 2016, chronic RBBB.  She has chronic bradycardia.  Chronic diastolic CHF: Chest x-ray stable pulmonary edema.  Patient does not look to be in decompensation.  Was on Lasix ,spironolactone at home.  Continue with gentle IV fluids for now.  We will continue to monitor input and output.  Hypertension: Currently blood pressure stable.  Continue current regimen  Acute pancreatitis/peripancreatic fluid collection: Was recently discharged for this management on 11/13.  GI and general surgery were involved.  Not a candidate for endoscopic procedure and no indication for surgery.  She was discharged on pain medications.  Abdominal aortic aneurysm: SP repair and stent to the common iliac artery.Continue withclopidogrel forantiplatelet therapy.  PAD(bilateral renal artery stents): On plavix   DVT prophylaxis: Heparin [place] Code Status: Full Family Communication: Discussed with daughter on phone Disposition Plan: Likely home after improvement in the mental status and acute kidney injury   Consultants: None  Procedures: None  Antimicrobials: None  Subjective:  Patient seen and examined the bedside this morning the emergency department.  Currently she is currently stable.  Mental status looks to have slightly improved.  Denies any specific complaints Objective: Vitals:   07/25/18 1030 07/25/18 1130 07/25/18 1200 07/25/18 1300  BP: (!) 142/59 (!) 132/45 (!) 135/40 (!) 144/46  Pulse:      Resp: 11 15 14 19   Temp:      TempSrc:      SpO2: 95% 95% 93% 95%  Weight:  Height:        Intake/Output Summary (Last 24 hours) at 07/25/2018 1342 Last data filed at 07/25/2018 0222 Gross per 24 hour  Intake 302.36 ml  Output -  Net 302.36 ml   Filed Weights   07/24/18 1802  Weight: 61.2 kg    Examination:  General exam:  Not in distress,average built, chronically ill looking HEENT:PERRL,Oral mucosa moist, Ear/Nose normal on gross exam Respiratory system: Bilateral equal air entry, normal vesicular breath sounds, no wheezes or crackles  Cardiovascular system: S1 & S2 heard, RRR. No JVD, murmurs, rubs, gallops or clicks. No pedal edema. Gastrointestinal system: Abdomen is nondistended, soft and nontender. No organomegaly or masses felt. Normal bowel sounds heard. Central nervous system: Alert and oriented to place,person. Cud tell current month.No focal neurological deficits. Extremities: No edema, no clubbing ,no cyanosis, distal peripheral pulses palpable. Skin: No rashes, lesions or ulcers,no icterus ,no pallor MSK: Normal muscle bulk,tone ,power Psychiatry: Judgement and insight appear impaired    Data Reviewed: I have personally reviewed following labs and imaging studies  CBC: Recent Labs  Lab 07/24/18 1827  WBC 8.7  HGB 7.9*  HCT 26.4*  MCV 97.4  PLT 355   Basic Metabolic Panel: Recent Labs  Lab 07/19/18 0458 07/24/18 1827 07/24/18 1954 07/25/18 0420  NA  --  137  --  140  K  --  2.7*  --  3.1*  CL  --  100  --  105  CO2  --  23  --  20*  GLUCOSE  --  153*  --  112*  BUN  --  84*  --  79*  CREATININE 2.34* 3.90*  --  3.73*  CALCIUM  --  8.6*  --  8.5*  MG  --   --  1.9  --    GFR: Estimated Creatinine Clearance: 13.5 mL/min (A) (by C-G formula based on SCr of 3.73 mg/dL (H)). Liver Function Tests: Recent Labs  Lab 07/24/18 1827  AST 15  ALT 8  ALKPHOS 51  BILITOT 0.8  PROT 6.1*  ALBUMIN 3.0*   No results for input(s): LIPASE, AMYLASE in the last 168 hours. Recent Labs  Lab 07/24/18 1954 07/25/18 0420  AMMONIA 41* 19   Coagulation Profile: No results for input(s): INR, PROTIME in the last 168 hours. Cardiac Enzymes: No results for input(s): CKTOTAL, CKMB, CKMBINDEX, TROPONINI in the last 168 hours. BNP (last 3 results) No results for input(s): PROBNP in the last  8760 hours. HbA1C: No results for input(s): HGBA1C in the last 72 hours. CBG: No results for input(s): GLUCAP in the last 168 hours. Lipid Profile: No results for input(s): CHOL, HDL, LDLCALC, TRIG, CHOLHDL, LDLDIRECT in the last 72 hours. Thyroid Function Tests: Recent Labs    07/25/18 0420  TSH 3.376   Anemia Panel: Recent Labs    07/25/18 0420  VITAMINB12 1,134*   Sepsis Labs: No results for input(s): PROCALCITON, LATICACIDVEN in the last 168 hours.  No results found for this or any previous visit (from the past 240 hour(s)).       Radiology Studies: Ct Head Wo Contrast  Result Date: 07/24/2018 CLINICAL DATA:  Altered mental status, somnolent. History of anemia, diabetes, pancreatitis. EXAM: CT HEAD WITHOUT CONTRAST TECHNIQUE: Contiguous axial images were obtained from the base of the skull through the vertex without intravenous contrast. COMPARISON:  CT HEAD June 30, 2018 FINDINGS: BRAIN: No intraparenchymal hemorrhage, mass effect nor midline shift. Mild parenchymal brain volume loss. Patchy supratentorial white matter  hypodensities within normal range for patient's age, though non-specific are most compatible with chronic small vessel ischemic disease. No acute large vascular territory infarcts. No abnormal extra-axial fluid collections. Basal cisterns are patent. VASCULAR: Severe calcific atherosclerosis of the carotid siphons. SKULL: No skull fracture. No significant scalp soft tissue swelling. SINUSES/ORBITS: Trace paranasal sinus mucosal thickening. Mastoid air cells are well aerated.The included ocular globes and orbital contents are non-suspicious. OTHER: None. IMPRESSION: 1. No acute intracranial process. 2. Mild parenchymal brain volume loss. 3. Mild chronic small vessel ischemic changes. Severe intracranial atherosclerosis. Electronically Signed   By: Elon Alas M.D.   On: 07/24/2018 20:33   Dg Chest Portable 1 View  Result Date: 07/24/2018 CLINICAL DATA:   Altered level of consciousness. EXAM: PORTABLE CHEST 1 VIEW COMPARISON:  07/08/2018 FINDINGS: Previous median sternotomy. Aortic atherosclerosis. Mild cardiac enlargement. Moderate diffuse pulmonary edema is similar to previous exam. No focal airspace opacities. IMPRESSION: 1. Cardiac enlargement and pulmonary edema. 2.  Aortic Atherosclerosis (ICD10-I70.0). Electronically Signed   By: Kerby Moors M.D.   On: 07/24/2018 20:38        Scheduled Meds: . allopurinol  100 mg Oral Daily  . amLODipine  10 mg Oral Daily  . atorvastatin  40 mg Oral q1800  . carvedilol  12.5 mg Oral BID WC  . clopidogrel  75 mg Oral Q breakfast  . feeding supplement  1 Container Oral BID  . gabapentin  300 mg Oral Daily  . heparin  5,000 Units Subcutaneous Q8H  . hydrALAZINE  25 mg Oral Q8H  . mometasone-formoterol  2 puff Inhalation BID  . pantoprazole  40 mg Oral BID  . sodium chloride flush  3 mL Intravenous Q12H  . vitamin B-12  5,000 mcg Oral Daily   Continuous Infusions: . sodium chloride 75 mL/hr at 07/25/18 1259     LOS: 0 days    Time spent:35 mins. More than 50% of that time was spent in counseling and/or coordination of care.      Shelly Coss, MD [facility] Pager [phone]  If 7PM-7AM, please contact night-coverage [email or web address] Password [email or web address] 07/25/2018, 1:42 PM

## 2018-07-25 NOTE — ED Notes (Signed)
Charge RN has updated patient's familyX2-Hospitalist has been paged numerous times-he stated to charge RN he would call family but has not yet spoken with family

## 2018-07-25 NOTE — Progress Notes (Signed)
PT Cancellation Note  Patient Details Name: CLORIA CIRESI MRN: [id number] DOB: Nov 11, 1950   Cancelled Treatment:    Reason Eval/Treat Not Completed: Medical issues which prohibited therapy. RN speaking to  Daughter and reports plans are to transfer to [facility]. PT will proceed when medically stable.    Claretha Cooper 07/25/2018, 9:44 AM [place] Pager [phone] Office [phone]

## 2018-07-25 NOTE — Progress Notes (Signed)
Pt arrived to room 3e20 via carelink, pt alert and opens eyes with movement then closes eyes, pt jerky and daughter reports PT decided not to see her at [facility] with jerky and lethargic, pt weight with bed scale, vss, iv fluids at 75/hr, paged Dr Tawanna Solo in regards to pt request for nephologist Dr Posey Pronto to come see her, discussed orders and poc at this time, telemetry box 13 verfied with Nat[place] and called to [place] and spoke to [facility]. Pt has multiple areas of bruises as dtr says fell against tv at home, pt has skin tear on right elbow, pt has stage 2 on sacral area that duaghter says she got when she was at hospital for several weeks,she reports home health nurse has been following and almost healed, Dr Posey Pronto in to see pt and will do further skin after he is finished

## 2018-07-26 DIAGNOSIS — K8689 Other specified diseases of pancreas: Secondary | ICD-10-CM | POA: Diagnosis not present

## 2018-07-26 DIAGNOSIS — K863 Pseudocyst of pancreas: Secondary | ICD-10-CM | POA: Diagnosis present

## 2018-07-26 DIAGNOSIS — Z7189 Other specified counseling: Secondary | ICD-10-CM | POA: Diagnosis not present

## 2018-07-26 DIAGNOSIS — T502X5A Adverse effect of carbonic-anhydrase inhibitors, benzothiadiazides and other diuretics, initial encounter: Secondary | ICD-10-CM | POA: Diagnosis present

## 2018-07-26 DIAGNOSIS — G9340 Encephalopathy, unspecified: Secondary | ICD-10-CM | POA: Diagnosis not present

## 2018-07-26 DIAGNOSIS — I2510 Atherosclerotic heart disease of native coronary artery without angina pectoris: Secondary | ICD-10-CM | POA: Diagnosis present

## 2018-07-26 DIAGNOSIS — Z9049 Acquired absence of other specified parts of digestive tract: Secondary | ICD-10-CM | POA: Diagnosis not present

## 2018-07-26 DIAGNOSIS — R1084 Generalized abdominal pain: Secondary | ICD-10-CM | POA: Diagnosis not present

## 2018-07-26 DIAGNOSIS — I132 Hypertensive heart and chronic kidney disease with heart failure and with stage 5 chronic kidney disease, or end stage renal disease: Secondary | ICD-10-CM | POA: Diagnosis present

## 2018-07-26 DIAGNOSIS — K861 Other chronic pancreatitis: Secondary | ICD-10-CM | POA: Diagnosis present

## 2018-07-26 DIAGNOSIS — R5383 Other fatigue: Secondary | ICD-10-CM

## 2018-07-26 DIAGNOSIS — Z7902 Long term (current) use of antithrombotics/antiplatelets: Secondary | ICD-10-CM | POA: Diagnosis not present

## 2018-07-26 DIAGNOSIS — Z951 Presence of aortocoronary bypass graft: Secondary | ICD-10-CM | POA: Diagnosis not present

## 2018-07-26 DIAGNOSIS — T424X5A Adverse effect of benzodiazepines, initial encounter: Secondary | ICD-10-CM | POA: Diagnosis present

## 2018-07-26 DIAGNOSIS — G894 Chronic pain syndrome: Secondary | ICD-10-CM | POA: Diagnosis present

## 2018-07-26 DIAGNOSIS — Z515 Encounter for palliative care: Secondary | ICD-10-CM | POA: Diagnosis not present

## 2018-07-26 DIAGNOSIS — I5033 Acute on chronic diastolic (congestive) heart failure: Secondary | ICD-10-CM | POA: Diagnosis present

## 2018-07-26 DIAGNOSIS — D631 Anemia in chronic kidney disease: Secondary | ICD-10-CM | POA: Diagnosis present

## 2018-07-26 DIAGNOSIS — Z955 Presence of coronary angioplasty implant and graft: Secondary | ICD-10-CM | POA: Diagnosis not present

## 2018-07-26 DIAGNOSIS — J449 Chronic obstructive pulmonary disease, unspecified: Secondary | ICD-10-CM | POA: Diagnosis present

## 2018-07-26 DIAGNOSIS — D649 Anemia, unspecified: Secondary | ICD-10-CM | POA: Diagnosis not present

## 2018-07-26 DIAGNOSIS — S92515A Nondisplaced fracture of proximal phalanx of left lesser toe(s), initial encounter for closed fracture: Secondary | ICD-10-CM | POA: Diagnosis present

## 2018-07-26 DIAGNOSIS — I5032 Chronic diastolic (congestive) heart failure: Secondary | ICD-10-CM | POA: Diagnosis not present

## 2018-07-26 DIAGNOSIS — G92 Toxic encephalopathy: Secondary | ICD-10-CM | POA: Diagnosis present

## 2018-07-26 DIAGNOSIS — F17210 Nicotine dependence, cigarettes, uncomplicated: Secondary | ICD-10-CM | POA: Diagnosis present

## 2018-07-26 DIAGNOSIS — E876 Hypokalemia: Secondary | ICD-10-CM | POA: Diagnosis present

## 2018-07-26 DIAGNOSIS — K859 Acute pancreatitis without necrosis or infection, unspecified: Secondary | ICD-10-CM | POA: Diagnosis present

## 2018-07-26 DIAGNOSIS — N179 Acute kidney failure, unspecified: Secondary | ICD-10-CM | POA: Diagnosis present

## 2018-07-26 DIAGNOSIS — X58XXXA Exposure to other specified factors, initial encounter: Secondary | ICD-10-CM | POA: Diagnosis present

## 2018-07-26 DIAGNOSIS — J9601 Acute respiratory failure with hypoxia: Secondary | ICD-10-CM | POA: Diagnosis present

## 2018-07-26 DIAGNOSIS — T402X5A Adverse effect of other opioids, initial encounter: Secondary | ICD-10-CM | POA: Diagnosis present

## 2018-07-26 DIAGNOSIS — E440 Moderate protein-calorie malnutrition: Secondary | ICD-10-CM | POA: Diagnosis present

## 2018-07-26 DIAGNOSIS — R001 Bradycardia, unspecified: Secondary | ICD-10-CM | POA: Diagnosis present

## 2018-07-26 DIAGNOSIS — I25118 Atherosclerotic heart disease of native coronary artery with other forms of angina pectoris: Secondary | ICD-10-CM | POA: Diagnosis not present

## 2018-07-26 LAB — RENAL FUNCTION PANEL
ALBUMIN: 2.4 g/dL — AB (ref 3.5–5.0)
Anion gap: 11 (ref 5–15)
BUN: 70 mg/dL — AB (ref 8–23)
CALCIUM: 8.4 mg/dL — AB (ref 8.9–10.3)
CO2: 20 mmol/L — ABNORMAL LOW (ref 22–32)
CREATININE: 3.8 mg/dL — AB (ref 0.44–1.00)
Chloride: 108 mmol/L (ref 98–111)
GFR calc Af Amer: 13 mL/min — ABNORMAL LOW (ref >60)
GFR, EST NON AFRICAN AMERICAN: 12 mL/min — AB (ref >60)
GLUCOSE: 133 mg/dL — AB (ref 70–99)
PHOSPHORUS: 5.6 mg/dL — AB (ref 2.5–4.6)
Potassium: 3.1 mmol/L — ABNORMAL LOW (ref 3.5–5.1)
SODIUM: 139 mmol/L (ref 135–145)

## 2018-07-26 LAB — CBC WITH DIFFERENTIAL/PLATELET
Abs Immature Granulocytes: 0.05 10*3/uL (ref 0.00–0.07)
BASOS ABS: 0.1 10*3/uL (ref 0.0–0.1)
Basophils Relative: 1 %
EOS ABS: 0.1 10*3/uL (ref 0.0–0.5)
EOS PCT: 1 %
HEMATOCRIT: 23.6 % — AB (ref 36.0–46.0)
Hemoglobin: 7.2 g/dL — ABNORMAL LOW (ref 12.0–15.0)
IMMATURE GRANULOCYTES: 1 %
LYMPHS ABS: 0.7 10*3/uL (ref 0.7–4.0)
Lymphocytes Relative: 9 %
MCH: 29.4 pg (ref 26.0–34.0)
MCHC: 30.5 g/dL (ref 30.0–36.0)
MCV: 96.3 fL (ref 80.0–100.0)
Monocytes Absolute: 0.7 10*3/uL (ref 0.1–1.0)
Monocytes Relative: 8 %
NEUTROS PCT: 80 %
NRBC: 0 % (ref 0.0–0.2)
Neutro Abs: 6.8 10*3/uL (ref 1.7–7.7)
PLATELETS: 282 10*3/uL (ref 150–400)
RBC: 2.45 MIL/uL — AB (ref 3.87–5.11)
RDW: 20 % — AB (ref 11.5–15.5)
WBC: 8.5 10*3/uL (ref 4.0–10.5)

## 2018-07-26 LAB — UREA NITROGEN, URINE: Urea Nitrogen, Ur: 298 mg/dL

## 2018-07-26 LAB — FOLATE RBC
FOLATE, HEMOLYSATE: 405.1 ng/mL
Folate, RBC: 1785 ng/mL (ref >498)
HEMATOCRIT: 22.7 % — AB (ref 34.0–46.6)

## 2018-07-26 LAB — GLUCOSE, CAPILLARY: GLUCOSE-CAPILLARY: 145 mg/dL — AB (ref 70–99)

## 2018-07-26 MED ORDER — BOOST / RESOURCE BREEZE PO LIQD CUSTOM
1.0000 | Freq: Three times a day (TID) | ORAL | Status: DC
  Administered 2018-07-26 – 2018-08-02 (×16): 1 via ORAL
  Filled 2018-07-26 (×14): qty 1

## 2018-07-26 MED ORDER — ELDERTONIC PO LIQD: 15.0000 mL | Freq: Every day | ORAL | Status: DC

## 2018-07-26 MED ORDER — PRO-STAT SUGAR FREE PO LIQD
30.0000 mL | Freq: Three times a day (TID) | ORAL | Status: DC
  Administered 2018-07-26 – 2018-08-03 (×22): 30 mL via ORAL
  Filled 2018-07-26 (×20): qty 30

## 2018-07-26 MED ORDER — ADULT MULTIVITAMIN LIQUID CH
15.0000 mL | Freq: Every day | ORAL | Status: DC
  Administered 2018-07-26 – 2018-07-27 (×2): 15 mL via ORAL
  Filled 2018-07-26 (×3): qty 15

## 2018-07-26 MED ORDER — POTASSIUM CHLORIDE CRYS ER 20 MEQ PO TBCR
40.0000 meq | EXTENDED_RELEASE_TABLET | Freq: Once | ORAL | Status: AC
  Administered 2018-07-26: 40 meq via ORAL
  Filled 2018-07-26: qty 2

## 2018-07-26 NOTE — NC FL2 (Signed)
[place] MEDICAID [id number] LEVEL OF CARE SCREENING TOOL     IDENTIFICATION  Patient Name: [name] Birthdate: August 18, 1951 Sex: female Admission Date (Current Location): 07/24/2018  [facility] and [place] Number:  Herbalist and Address:  [facility], [place] [address] [place], [place] [address]      Provider Number: [id number]  Attending Physician Name and Address:  Caren Griffins, MD  Relative Name and Phone Number:       Current Level of Care: Hospital Recommended Level of Care: [place] Prior Approval Number:    Date Approved/Denied:   PASRR Number: [id number]  Discharge Plan: SNF    Current Diagnoses: Patient Active Problem List   Diagnosis Date Noted  . Acute encephalopathy 07/24/2018  . Chronic pain 07/24/2018  . Malnutrition of moderate degree 07/13/2018  . Stage II pressure ulcer ([place]) 07/12/2018  . Fluid collection at surgical site   . Intraabdominal fluid collection   . Cystic duct calculus   . RUQ pain 07/08/2018  . Pancreatic pseudocyst 07/04/2018  . 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 Urinary retention 07/04/2018  . S/P insertion of iliac artery stent 07/04/2018  . Endoleak post (EVAR) endovascular aneurysm repair ([place]) 07/04/2018  . Physical deconditioning   . Esophageal ulcer with bleeding   . Pressure injury of skin 06/16/2018  . Hematemesis   . Palliative care patient   . Palliative care by specialist   . Anasarca 06/07/2018  . Demand ischemia ([place]) 06/07/2018  . Protein-calorie malnutrition, severe 05/29/2018  . Type II diabetes mellitus with renal manifestations ([place]) 05/28/2018  . Pancreatitis, recurrent 05/28/2018  . Hypokalemia 05/28/2018  . SIRS (systemic inflammatory response syndrome) ([place]) 05/28/2018  . Pancreatitis 05/28/2018  . Volume overload 04/17/2018  . Anemia due to GI blood loss 04/14/2018  . Acute gallstone pancreatitis 04/14/2018  . Acute cholecystitis 04/14/2018  .  Gastrointestinal hemorrhage   . Fatigue associated with anemia 04/09/2018  . Hypoglycemia 01/19/2018  . Type II diabetes mellitus ([place]) 01/19/2018  . Nausea with vomiting 01/19/2018  . Uncontrolled hypertension 01/19/2018  . Hyperlipidemia 01/19/2018  . CAD (coronary artery disease) 01/19/2018  . COPD with hypoxia ([place]) 01/19/2018  . Chronic kidney disease (CKD), stage IV (severe) ([place]) 01/19/2018  . Retroperitoneal hematoma 01/19/2018  . Acute blood loss anemia 01/19/2018  . Chronic diastolic congestive heart failure ([place]) 01/19/2018  . AAA (abdominal aortic aneurysm) ([place]) 01/16/2018  . Elevated brain natriuretic peptide (BNP) level 10/07/2015  . Diabetes mellitus ([place]) 10/07/2015  . Pain in the chest   . PAD (peripheral artery disease) ([place]) 07/29/2015  . Chest pain 07/04/2015  . Acute renal failure superimposed on stage 4 chronic kidney disease ([place]) 07/04/2015  . GERD (gastroesophageal reflux disease) 07/04/2015  . Anemia 06/26/2015  . Coronary artery disease involving autologous vein coronary bypass graft with unstable angina pectoris ([place]) 06/24/2015  . Angina at rest [facility])   . Subclavian artery stenosis, left ([place]) 06/23/2015  . AAA (abdominal aortic aneurysm) without rupture ([place]) 12/10/2014  . Subclavian steal syndrome 12/10/2014  . Renal artery stenosis ([place]) 09/04/2013  . Carotid artery stenosis 09/28/2012  . Mitral valve disorder 06/23/2011  . S/P CABG x 3 06/16/2011  . S/P Maze operation for atrial fibrillation 06/16/2011  . S/P MVR (mitral valve repair) 06/16/2011  . Follow-up examination following surgery 03/29/2011  . CKD (chronic kidney disease) 03/03/2011  . COPD (chronic obstructive pulmonary disease) ([place]) 03/03/2011  . Chronic diastolic heart failure ([place]) 02/26/2011  .  Hypotension 02/26/2011  . NSTEMI (non-ST elevated myocardial infarction) ([place]) 02/26/2011  . Long term (current) use of anticoagulants 12/17/2010  . OBSTRUCTIVE SLEEP APNEA 09/04/2010  .  HYPOXEMIA 09/04/2010  . SNORING 08/05/2010  . Atrial fibrillation ([place]) 12/09/2009  . WEIGHT LOSS 03/28/2009  . PALPITATIONS 03/28/2009  . PVD 11/21/2008  . HYPERLIPIDEMIA-MIXED 11/20/2008  . TOBACCO ABUSE 11/20/2008  . Essential hypertension 11/20/2008  . Coronary artery disease involving native heart with angina pectoris ([place]) 11/20/2008  . ABDOMINAL AORTIC ANEURYSM 11/20/2008    [place] RESPIRATION BLADDER Height & Weight     Self, Place  Normal Continent Weight: 124 lb 9 oz (56.5 kg) Height:  5\' 6"  (167.6 cm)  BEHAVIORAL SYMPTOMS/MOOD NEUROLOGICAL BOWEL NUTRITION STATUS  (None) (None) Continent Diet(Regular)  AMBULATORY STATUS COMMUNICATION OF NEEDS Skin   Limited Assist Verbally Bruising, PU Stage and Appropriate Care(Skin tear. Unstageable pressure injury: Foam prn.) PU Stage 1 Dressing: (Posterior sacrum: Foam prn.)                     Personal Care Assistance Level of Assistance  Bathing, Feeding, Dressing Bathing Assistance: Limited assistance Feeding assistance: Limited assistance Dressing Assistance: Limited assistance     Functional Limitations Info  Sight, Hearing, Speech Sight Info: Adequate Hearing Info: Adequate Speech Info: Adequate    SPECIAL CARE FACTORS FREQUENCY  PT (By licensed PT), Blood pressure, OT (By licensed OT)     PT Frequency: 5 x week OT Frequency: 5 x week            Contractures Contractures Info: Not present    Additional Factors Info  Code Status, Allergies Code Status Info: Full Allergies Info: Isosorbide, Isosorbide Nitrate, Oxycodone-acetaminophen, Codeine, Contrast Media (Iodinated Diagnostic Agents), Ioxaglate, Metrizamide, Percocet (Oxycodone-acetaminophen).           Current Medications (07/26/2018):  This is the current hospital active medication list Current Facility-Administered Medications  Medication Dose Route Frequency Provider Last Rate Last Dose  . 0.9 %  sodium chloride infusion   Intravenous  Continuous Elmarie Shiley, MD 50 mL/hr at 07/26/18 1112    . acetaminophen (TYLENOL) tablet 650 mg  650 mg Oral Q6H PRN Opyd, Ilene Qua, MD       Or  . acetaminophen (TYLENOL) suppository 650 mg  650 mg Rectal Q6H PRN Opyd, Ilene Qua, MD      . allopurinol (ZYLOPRIM) tablet 100 mg  100 mg Oral Daily Opyd, Ilene Qua, MD   100 mg at 07/26/18 1109  . amLODipine (NORVASC) tablet 10 mg  10 mg Oral Daily Opyd, Ilene Qua, MD   10 mg at 07/26/18 1108  . atorvastatin (LIPITOR) tablet 40 mg  40 mg Oral q1800 Opyd, Ilene Qua, MD   40 mg at 07/25/18 1709  . carvedilol (COREG) tablet 12.5 mg  12.5 mg Oral BID WC Opyd, Ilene Qua, MD   12.5 mg at 07/26/18 1107  . clopidogrel (PLAVIX) tablet 75 mg  75 mg Oral Q breakfast Opyd, Ilene Qua, MD   75 mg at 07/26/18 1108  . feeding supplement (BOOST / RESOURCE BREEZE) liquid 1 Container  1 Container Oral TID BM Gherghe, Costin M, MD      . feeding supplement (PRO-STAT SUGAR FREE 64) liquid 30 mL  30 mL Oral TID Caren Griffins, MD      . gabapentin (NEURONTIN) capsule 300 mg  300 mg Oral Daily Opyd, Ilene Qua, MD   300 mg at 07/26/18 1110  . heparin injection 5,000 Units  5,000 Units Subcutaneous Q8H Shelly Coss, MD   5,000 Units at 07/26/18 0631  . hydrALAZINE (APRESOLINE) tablet 25 mg  25 mg Oral Q8H Opyd, Ilene Qua, MD   25 mg at 07/26/18 0631  . mometasone-formoterol (DULERA) 200-5 MCG/ACT inhaler 2 puff  2 puff Inhalation BID Opyd, Ilene Qua, MD   2 puff at 07/26/18 0742  . multivitamin liquid 15 mL  15 mL Oral Daily Caren Griffins, MD      . naloxone [facility]) injection 0.4 mg  0.4 mg Intravenous PRN Elmarie Shiley, MD      . ondansetron [facility]) tablet 4 mg  4 mg Oral Q6H PRN Opyd, Ilene Qua, MD       Or  . ondansetron (ZOFRAN) injection 4 mg  4 mg Intravenous Q6H PRN Opyd, Ilene Qua, MD      . pantoprazole (PROTONIX) EC tablet 40 mg  40 mg Oral BID Opyd, Ilene Qua, MD   40 mg at 07/26/18 1118  . sodium chloride flush (NS) 0.9 % injection 3 mL  3 mL  Intravenous Q12H Opyd, Ilene Qua, MD   3 mL at 07/25/18 1022     Discharge Medications: Please see discharge summary for a list of discharge medications.  Relevant Imaging Results:  Relevant Lab Results:   Additional Information SS#: [id number]  Candie Chroman, LCSW

## 2018-07-26 NOTE — Evaluation (Signed)
Physical Therapy Evaluation Patient Details Name: [name] MRN: [id number] DOB: 1951-05-07 Today's Date: 07/26/2018   History of Present Illness  Pt is a 67 y.o. female admitted to [facility] 07/24/18 with lethargy and AMS; transferred to [facility]. Worked up for AKI on CKD IV; per nephrology, no hard indications for initiation of HD at this time. Altered level of consciousness attributed to likely polypharmacy. PMH includes HTN, CKD IV-V, neuropathy, CAD (s/p CABG), PVD, chronic back pain, COPD. Of note, recent admission from [facility] to [facility] 11/2-11/13 with RUQ pain.    Clinical Impression  Pt presents with an overall decrease in functional mobility secondary to above. PTA, pt recently discharged from hospital to daughter's home last week; pt has been ambulatory with RW, only requiring intermittent assist until a few days ago when symptoms worsened and requiring increased assist. Today, pt lethargic, requiring max cues to stay awake and answering minimal questions. Required modA to take pivotal steps from bed<>BSC with RW. Pt would benefit from continued acute PT services to maximize functional mobility and independence prior to d/c with SNF-level therapies.     Follow Up Recommendations SNF;Supervision/Assistance - 24 hour    Equipment Recommendations  None recommended by PT    Recommendations for Other Services       Precautions / Restrictions Precautions Precautions: Fall Restrictions Weight Bearing Restrictions: No      Mobility  Bed Mobility Overal bed mobility: Needs Assistance Bed Mobility: Supine to Sit;Sit to Supine     Supine to sit: Mod assist Sit to supine: Supervision   General bed mobility comments: Pt lethargic, eventually requiring modA to assist to EOB in attempt to wake up. Supervision for return to supine  Transfers Overall transfer level: Needs assistance Equipment used: Rolling walker (2 wheeled) Transfers: Sit to/from Stand Sit to Stand: Min assist         General transfer comment: Bilateral knee buckling with standing, also with tremors all over. MinA to maintain balance standing from bed and [facility]  Ambulation/Gait Ambulation/Gait assistance: Mod assist Gait Distance (Feet): 2 Feet Assistive device: 4-wheeled walker Gait Pattern/deviations: Step-to pattern;Trunk flexed;Leaning posteriorly Gait velocity: Decreased   General Gait Details: Took pivotal steps from bed<>BSC with RW and modA to maintain balance. Pt with bilat knee buckling and body tremors that seem worse with intentional movement. Pt attempting to sit before safely in front of BSC despite cues  Stairs            Wheelchair Mobility    Modified Rankin (Stroke Patients Only)       Balance Overall balance assessment: Needs assistance Sitting-balance support: Feet supported;Bilateral upper extremity supported Sitting balance-Leahy Scale: Fair Sitting balance - Comments: Tremors while seated, sliding towards EOB requiring cues to scoot back     Standing balance-Leahy Scale: Poor                               Pertinent Vitals/Pain Pain Assessment: No/denies pain    Home Living Family/patient expects to be discharged to:: Private residence Living Arrangements: Children Available Help at Discharge: Family;Available 24 hours/day Type of Home: Apartment Home Access: Ramped entrance     Home Layout: One level Home Equipment: Walker - 4 wheels;Bedside commode;Shower seat - built in;Transport chair      Prior Function Level of Independence: Needs assistance   Gait / Transfers Assistance Needed: Pt has been home with daughter since hospital d/c one week ago; was  mod indep with RW up until requiring increased assist a few days ago  ADL's / Homemaking Assistance Needed: Was indep with pericare after hospital d/c, but needed assist when symptoms started a few days ago        Hand Dominance        Extremity/Trunk Assessment   Upper Extremity  Assessment Upper Extremity Assessment: Generalized weakness(Tremors noted)    Lower Extremity Assessment Lower Extremity Assessment: Generalized weakness(Tremors noted, worse with intention)       Communication      Cognition Arousal/Alertness: Lethargic;Suspect due to medications Behavior During Therapy: Flat affect Overall Cognitive Status: Difficult to assess Area of Impairment: Attention;Following commands;Safety/judgement;Awareness;Problem solving                   Current Attention Level: Focused   Following Commands: Follows one step commands inconsistently;Follows one step commands with increased time Safety/Judgement: Decreased awareness of safety;Decreased awareness of deficits Awareness: Intellectual Problem Solving: Slow processing;Decreased initiation;Requires verbal cues;Requires tactile cues General Comments: Pt requiring repeated, max cues to stay awake and interact with PT. Able to state name and birthday; able to state daugthers names with increased time when asked repeatedly. Answered yes/no when cued repeatedly. At end of session "Take off my socks"      General Comments General comments (skin integrity, edema, etc.): Daughters present    Exercises     Assessment/Plan    PT Assessment Patient needs continued PT services  PT Problem List Decreased strength;Decreased mobility;Decreased coordination;Decreased activity tolerance;Decreased balance;Decreased knowledge of use of DME;Decreased cognition;Decreased safety awareness       PT Treatment Interventions DME instruction;Therapeutic activities;Gait training;Therapeutic exercise;Patient/family education;Balance training;Functional mobility training;Stair training;Cognitive remediation    PT Goals (Current goals can be found in the Care Plan section)  Acute Rehab PT Goals Patient Stated Goal: Get stronger at SNF before returning to daughter's home PT Goal Formulation: With family Time For Goal  Achievement: 08/09/18 Potential to Achieve Goals: Good    Frequency Min 2X/week   Barriers to discharge        Co-evaluation               AM-PAC PT "6 Clicks" Daily Activity  Outcome Measure Difficulty turning over in bed (including adjusting bedclothes, sheets and blankets)?: A Little Difficulty moving from lying on back to sitting on the side of the bed? : Unable Difficulty sitting down on and standing up from a chair with arms (e.g., wheelchair, bedside commode, etc,.)?: Unable Help needed moving to and from a bed to chair (including a wheelchair)?: A Little Help needed walking in hospital room?: A Little Help needed climbing 3-5 steps with a railing? : A Lot 6 Click Score: 13    End of Session Equipment Utilized During Treatment: Gait belt Activity Tolerance: Patient limited by fatigue;Patient limited by lethargy Patient left: in bed;with call bell/phone within reach;with bed alarm set;with family/visitor present Nurse Communication: Mobility status PT Visit Diagnosis: Muscle weakness (generalized) (M62.81);Other abnormalities of gait and mobility (R26.89)    Time: 1000-1024 PT Time Calculation (min) (ACUTE ONLY): 24 min   Charges:   PT Evaluation $PT Eval Moderate Complexity: 1 Mod PT Treatments $Therapeutic Activity: 8-22 mins       Mabeline Caras, PT, DPT Acute Rehabilitation Services  Pager [phone] Office [place] 07/26/2018, 10:38 AM

## 2018-07-26 NOTE — Progress Notes (Signed)
Initial Nutrition Assessment  DOCUMENTATION CODES:   Non-severe (moderate) malnutrition in context of chronic illness  INTERVENTION:   - Increase Boost Breeze to TID - Add ProStat TID - Liberalize diet to Regular given very poor PO intake and protein-calorie malnutrition - D/c vitamin B12 5000 mcg daily given elevated vitamin B12 levels - Add MVI with minerals   NUTRITION DIAGNOSIS:   Moderate Malnutrition related to chronic illness as evidenced by moderate fat depletion, severe muscle depletion, energy intake < or equal to 50% for > or equal to 1 month, percent weight loss, per patient/family report.  GOAL:   Patient will meet greater than or equal to 90% of their needs  MONITOR:   PO intake, Supplement acceptance, Skin, Labs, Weight trends  REASON FOR ASSESSMENT:   Consult Assessment of nutrition requirement/status  ASSESSMENT:   67 yo female, admitted with acute encephalopathy. PMH significant for AAA, acute cholecystitis s/p cholecystectomy, gallstone pancreatitis, CKD IV, COPD, CAD, DM, hypertension, NSTEMI, and pancreatitis with pseudocyst. Recent admissions to [facility] - 9/23 for never resolved pancreatitis and GIB. 11/2 US showed likely hematoma in gallbladder fossa. 11/4 MRCP retained stone in cystic stump, no choledocholithiasis, fluid in gallbladder fossa concerning for possible post-op bile leak. Required TF via Cortrak, diet advanced to Dys 2.   Labs: potassium 3.1, glucose 133, BUN 70, Creatinine 3.80, corrected Ca WNL, phosphorus 5.6, GFR 12%/13%, ferritin 1,240 (H), vitamin B12 1,134 (H), Hgb 7.2, Hct 23.6% Meds: Lipitor 40 mg daily, Protonix ED 40 mg BID, vitamin B12 5000 mcg daily, Boost Breeze BID, K-Dur, Klor-Con 40 mEq once on 11/20  Per chart, 61.2 kg on 11/2 --> 7.6% wt loss in 3 weeks  Pt asleep, daughters present at time of visit.  Family reports UBW 140-150#, last weighing this amount in July/August 2019. If 145# (65.9 kg), this amounts to a 9.4 kg  (14%) weight loss in ~3 months.  Family states pt appetite has been poor since the summer. Describes her as a picky eater, which makes getting her to eat more difficult. On good days, pt eats ~25% of meals. Concerned that Heart Healthy diet is limiting her menu options. Recommend liberalizing diet to provide pt more options and hopefully encourage better intake.   Boost Breeze currently ordered BID - will increase to TID. ProStat ordered last admission and family eager to have it again - will order TID.  Family denies nausea or vomiting, or difficulty chewing and swallowing. Reports pt had BM 11/19 while at [facility]; pt has incontinent bowels.   NUTRITION - FOCUSED PHYSICAL EXAM:   Most Recent Value  Orbital Region  No depletion  Upper Arm Region  Moderate depletion  Thoracic and Lumbar Region  Unable to assess  Buccal Region  Mild depletion  Temple Region  Mild depletion  Clavicle Bone Region  Mild depletion  Clavicle and Acromion Bone Region  Moderate depletion  Scapular Bone Region  Unable to assess  Dorsal Hand  Unable to assess  Patellar Region  Severe depletion  Anterior Thigh Region  Severe depletion  Posterior Calf Region  Severe depletion  Edema (RD Assessment)  None  Hair  Reviewed  Eyes  Unable to assess  Mouth  Unable to assess  Skin  Other (Comment)  Nails  Reviewed      Diet Order:  25% of last 1 meal, per nsg documentation Diet Order            Diet regular Room service appropriate? Yes; Fluid consistency: Thin  Diet effective now              EDUCATION NEEDS:  Education needs have been addressed  Skin:  Skin Assessment: Skin Integrity Issues: Skin Integrity Issues:: Other (Comment) Stage I: sacrum Other: skin tear and ecchymosis on R arm  Last BM:  11/12, type 7  Height:  Ht Readings from Last 1 Encounters:  07/25/18 5\' 6"  (1.676 m)    Weight:  Wt Readings from Last 1 Encounters:  07/26/18 56.5 kg    Ideal Body Weight:  56.8 kg  BMI:   Body mass index is 20.1 kg/m.  Estimated Nutritional Needs:   Kcal:  1700-2000 calories daily (30-35 kcal/kg ABW)  Protein:  85-100 g protein daily (1.5-1.8 g/kg ABW)  Fluid:  >/= 1.7 L daily or per MD discretion  Althea Grimmer, MS, RDN, LDN On-call pager: [phone]

## 2018-07-26 NOTE — Progress Notes (Addendum)
PROGRESS NOTE  [name] NGE:[id number] DOB: [birth year] DOA: 07/24/2018 PCP: Bernerd Limbo, MD   LOS: 0 days   Brief Narrative / Interim history: 67 year old female with history of abdominal aortic aneurysm status post vascular repair, chronic kidney disease stage IV, CAD, recent admission to the hospital for acute pancreatitis with pseudocyst, diastolic CHF, chronic pain syndrome who was brought to the hospital 3 to 4 days after last discharge due to persistent somnolence.  She was found to have acute kidney injury and chronic kidney disease, she was admitted to the hospital and nephrology was consulted.  Subjective: -No complaints this morning, appears alert, knows where she is but has slight difficulties recalling why she is here  Assessment & Plan: Principal Problem:   Acute encephalopathy Active Problems:   Essential hypertension   Anemia   Acute renal failure superimposed on stage 4 chronic kidney disease (HCC)   CAD (coronary artery disease)   Chronic diastolic congestive heart failure (HCC)   Hypokalemia   Chronic pain   Acute metabolic encephalopathy -Patient was started on narcotics during her prior hospital stay, she tolerated them well we will hospitalized however when she got home she became progressively more lethargic.  Is not clear whether she is to be this prescribed, however the daughters were at bedside tells me that they have been managing her medications and did not give her more than she was taken to the hospital.  Daughters also tells me that they did not give Klonopin to her -Mental status continues to improve, daughters feel that she is not yet back to baseline, while she is answering orientation questions they feel like her thought pattern and her understanding is still different from her normal self as well as she appears to be extremely weak when compared to her normal self -continue to hold benzodiazepines as well as narcotics  Acute kidney injury on  chronic kidney disease stage IV -Discussed with Dr. Posey Pronto, decrease rate of fluids today to prevent fluid overload, creatinine still significantly elevated at 3.8, no indication for acute dialysis right now we will continue to monitor closely urine output, mental status and fluid status. Potential diuresis tomorrow and may even need dialysis in the next 2-3 days, continues to require hospital stay  Hypokalemia -Supplement again today  Anemia of chronic kidney disease -Hemoglobin appears overall stable, no active bleeding, continue B12 and iron  Chronic pain syndrome -Hold pain medications, hold clonazepam and Cymbalta  Acute on chronic diastolic CHF -She does have evidence of fluid overload with slight crackles and lower extremity edema, clinically she is stable, respiratory status is stable, decrease the fluid rate to 50/hour and only continue for 12 more hours while closely monitoring fluid status, due to renal failure. May need diuresis tomorrow, defer to nephrology   Coronary artery disease -Most recent DES in 2016, history of CABG as well.  No chest pain, continue Plavix, statin, beta-blocker  Hypertension -Blood pressure stable, continue current regimen  Status post recent hospitalization for acute pancreatitis with peripancreatic fluid collection -No nausea vomiting or abdominal pain currently.  Abdominal aortic aneurysm /PAD -Status post repair with stent to the common iliac artery, continue Plavix    Scheduled Meds: . allopurinol  100 mg Oral Daily  . amLODipine  10 mg Oral Daily  . atorvastatin  40 mg Oral q1800  . carvedilol  12.5 mg Oral BID WC  . clopidogrel  75 mg Oral Q breakfast  . feeding supplement  1 Container Oral BID  .  gabapentin  300 mg Oral Daily  . heparin  5,000 Units Subcutaneous Q8H  . hydrALAZINE  25 mg Oral Q8H  . mometasone-formoterol  2 puff Inhalation BID  . pantoprazole  40 mg Oral BID  . sodium chloride flush  3 mL Intravenous Q12H  .  vitamin B-12  5,000 mcg Oral Daily   Continuous Infusions: . sodium chloride 75 mL/hr at 07/26/18 0324   PRN Meds:.acetaminophen **OR** acetaminophen, naLOXone (NARCAN)  injection, ondansetron **OR** ondansetron (ZOFRAN) IV  DVT prophylaxis: heparin Code Status: Full Family Communication: 2 daughters at bedside  Disposition Plan: TBD  Consultants:   Nephrology   Procedures:   None   Antimicrobials:  None    Objective: Vitals:   07/25/18 2157 07/26/18 0123 07/26/18 0547 07/26/18 0742  BP: (!) 141/50 (!) 160/46 (!) 136/48   Pulse: 60 60 (!) 57   Resp:  18 16   Temp: 99.8 F (37.7 C) 98.9 F (37.2 C) 98.3 F (36.8 C)   TempSrc: Oral Oral Oral   SpO2: 100% 100% 98% 97%  Weight:  56.5 kg    Height:        Intake/Output Summary (Last 24 hours) at 07/26/2018 0951 Last data filed at 07/26/2018 0650 Gross per 24 hour  Intake 1577.98 ml  Output 400 ml  Net 1177.98 ml   Filed Weights   07/24/18 1802 07/25/18 1542 07/26/18 0123  Weight: 61.2 kg 58.3 kg 56.5 kg    Examination:  Constitutional: Appears comfortable Eyes:  lids and conjunctivae normal ENMT: Mucous membranes are moist.  Neck: normal, supple Respiratory: Faint bibasilar crackles, no wheezing heard.  Good air movement.  Normal respiratory effort Cardiovascular: Regular rate and rhythm, holosystolic murmur.  1+ LE edema. 2+ pedal pulses.   Abdomen: no tenderness. Bowel sounds positive.  Musculoskeletal: no clubbing / cyanosis.  Skin: no rashes Neurologic: No focal deficits, equal strength Psychiatric: Alert and oriented x3, however slow to respond  Data Reviewed: I have independently reviewed following labs and imaging studies   CBC: Recent Labs  Lab 07/24/18 1827 07/26/18 0403  WBC 8.7 8.5  NEUTROABS  --  6.8  HGB 7.9* 7.2*  HCT 26.4* 23.6*  MCV 97.4 96.3  PLT 337 937   Basic Metabolic Panel: Recent Labs  Lab 07/24/18 1827 07/24/18 1954 07/25/18 0420 07/25/18 1705 07/26/18 0403  NA  137  --  140 139 139  K 2.7*  --  3.1* 3.3* 3.1*  CL 100  --  105 107 108  CO2 23  --  20* 18* 20*  GLUCOSE 153*  --  112* 116* 133*  BUN 84*  --  79* 72* 70*  CREATININE 3.90*  --  3.73* 3.97* 3.80*  CALCIUM 8.6*  --  8.5* 8.6* 8.4*  MG  --  1.9  --   --   --   PHOS  --   --   --   --  5.6*   GFR: Estimated Creatinine Clearance: 13.2 mL/min (A) (by C-G formula based on SCr of 3.8 mg/dL (H)). Liver Function Tests: Recent Labs  Lab 07/24/18 1827 07/26/18 0403  AST 15  --   ALT 8  --   ALKPHOS 51  --   BILITOT 0.8  --   PROT 6.1*  --   ALBUMIN 3.0* 2.4*   No results for input(s): LIPASE, AMYLASE in the last 168 hours. Recent Labs  Lab 07/24/18 1954 07/25/18 0420  AMMONIA 41* 19   Coagulation Profile: No results for  input(s): INR, PROTIME in the last 168 hours. Cardiac Enzymes: No results for input(s): CKTOTAL, CKMB, CKMBINDEX, TROPONINI in the last 168 hours. BNP (last 3 results) No results for input(s): PROBNP in the last 8760 hours. HbA1C: No results for input(s): HGBA1C in the last 72 hours. CBG: No results for input(s): GLUCAP in the last 168 hours. Lipid Profile: No results for input(s): CHOL, HDL, LDLCALC, TRIG, CHOLHDL, LDLDIRECT in the last 72 hours. Thyroid Function Tests: Recent Labs    07/25/18 0420  TSH 3.376   Anemia Panel: Recent Labs    07/25/18 0420 07/25/18 1705  VITAMINB12 1,134*  --   FERRITIN  --  1,240*  TIBC  --  375  IRON  --  35   Urine analysis:    Component Value Date/Time   COLORURINE YELLOW 07/25/2018 0530   APPEARANCEUR CLEAR 07/25/2018 0530   LABSPEC 1.008 07/25/2018 0530   PHURINE 5.0 07/25/2018 0530   GLUCOSEU NEGATIVE 07/25/2018 0530   HGBUR NEGATIVE 07/25/2018 0530   BILIRUBINUR NEGATIVE 07/25/2018 0530   KETONESUR NEGATIVE 07/25/2018 0530   PROTEINUR NEGATIVE 07/25/2018 0530   UROBILINOGEN 0.2 03/04/2011 2107   NITRITE NEGATIVE 07/25/2018 0530   LEUKOCYTESUR TRACE (A) 07/25/2018 0530   Sepsis Labs: Invalid  input(s): PROCALCITONIN, LACTICIDVEN  No results found for this or any previous visit (from the past 240 hour(s)).    Radiology Studies: Ct Head Wo Contrast  Result Date: 07/24/2018 CLINICAL DATA:  Altered mental status, somnolent. History of anemia, pancreatitis. EXAM: CT HEAD WITHOUT CONTRAST TECHNIQUE: Contiguous axial images were obtained from the base of the skull through the vertex without intravenous contrast. COMPARISON:  CT HEAD June 30, 2018 FINDINGS: BRAIN: No intraparenchymal hemorrhage, mass effect nor midline shift. Mild parenchymal brain volume loss. Patchy supratentorial white matter hypodensities within normal range for patient's age, though non-specific are most compatible with chronic small vessel ischemic disease. No acute large vascular territory infarcts. No abnormal extra-axial fluid collections. Basal cisterns are patent. VASCULAR: Severe calcific atherosclerosis of the carotid siphons. SKULL: No skull fracture. No significant scalp soft tissue swelling. SINUSES/ORBITS: Trace paranasal sinus mucosal thickening. Mastoid air cells are well aerated.The included ocular globes and orbital contents are non-suspicious. OTHER: None. IMPRESSION: 1. No acute intracranial process. 2. Mild parenchymal brain volume loss. 3. Mild chronic small vessel ischemic changes. Severe intracranial atherosclerosis. Electronically Signed   By: Elon Alas M.D.   On: 07/24/2018 20:33   Dg Chest Portable 1 View  Result Date: 07/24/2018 CLINICAL DATA:  Altered level of consciousness. EXAM: PORTABLE CHEST 1 VIEW COMPARISON:  07/08/2018 FINDINGS: Previous median sternotomy. Aortic atherosclerosis. Mild cardiac enlargement. Moderate diffuse pulmonary edema is similar to previous exam. No focal airspace opacities. IMPRESSION: 1. Cardiac enlargement and pulmonary edema. 2.  Aortic Atherosclerosis (ICD10-I70.0). Electronically Signed   By: Kerby Moors M.D.   On: 07/24/2018 20:38     Marzetta Board, MD, PhD [facility] Pager [phone]  If 7PM-7AM, please contact night-coverage [email or web address] Password [facility] 07/26/2018, 9:51 AM

## 2018-07-26 NOTE — Progress Notes (Signed)
Patient ID: [name], female   DOB: 09-06-1951, 67 y.o.   MRN: [id number] [facility] Progress Note   Assessment/ Plan:   1.  Acute kidney injury on chronic kidney disease stage IV: This appears to be possibly hemodynamically mediated acute kidney injury with her recent changes in mentation leading to decreased oral intake in the setting of ongoing diuretic use-unchanged renal function overnight with intravenous fluids and oliguric urine output.  We will decrease the rate of normal saline to avoid problems with volume overload.  No indications for hemodialysis yet. 2.  Altered level of consciousness: This appears to be primarily from polypharmacy with multiple agents likely contributing to her recent somnolence/decreased responsiveness.    Naloxone as needed benzodiazepine on hold and narcotic analgesics on hold.  Agree with decreased gabapentin.. 3.  Hypertension: Hold diuretics at this time and monitor on current antihypertensive therapy. 4.  Hypokalemia: Secondary to diuretic induced losses with decreased oral intake, agree with replacement via oral route. 5.  Anemia: Unfortunately rehospitalized before she was able to establish her visits again with short stay for ESA/iron therapy.    Low iron saturation however elevated ferritin not permissive to intravenous iron. 6.  Chronic protein calorie malnutrition/deconditioning: Secondary to chronic pancreatitis and recent prolonged hospitalization-agree with continued nutritional supplements and reevaluation by PT/OT for outpatient needs.  Subjective:   Reports to be feeling somewhat better this morning.  Denies any pain.   Objective:   BP (!) 136/48 (BP Location: Left Arm)   Pulse (!) 57   Temp 98.3 F (36.8 C) (Oral)   Resp 16   Ht 5\' 6"  (1.676 m)   Wt 56.5 kg   SpO2 97%   BMI 20.10 kg/m   Intake/Output Summary (Last 24 hours) at 07/26/2018 0827 Last data filed at 07/26/2018 0650 Gross per 24 hour  Intake 1577.98 ml   Output 400 ml  Net 1177.98 ml   Weight change: -2.9 kg  Physical Exam: Gen: Comfortably sitting up in bed, daughters at bedside CVS: Pulse regular rhythm, normal rate, S1 and S2 normal Resp: Diminished breath sounds over bases-poor inspiratory effort.  No rales Abd: Soft, flat, nontender Ext: 1+ bilateral pitting edema  Imaging: Ct Head Wo Contrast  Result Date: 07/24/2018 CLINICAL DATA:  Altered mental status, somnolent. History of anemia, diabetes, pancreatitis. EXAM: CT HEAD WITHOUT CONTRAST TECHNIQUE: Contiguous axial images were obtained from the base of the skull through the vertex without intravenous contrast. COMPARISON:  CT HEAD June 30, 2018 FINDINGS: BRAIN: No intraparenchymal hemorrhage, mass effect nor midline shift. Mild parenchymal brain volume loss. Patchy supratentorial white matter hypodensities within normal range for patient's age, though non-specific are most compatible with chronic small vessel ischemic disease. No acute large vascular territory infarcts. No abnormal extra-axial fluid collections. Basal cisterns are patent. VASCULAR: Severe calcific atherosclerosis of the carotid siphons. SKULL: No skull fracture. No significant scalp soft tissue swelling. SINUSES/ORBITS: Trace paranasal sinus mucosal thickening. Mastoid air cells are well aerated.The included ocular globes and orbital contents are non-suspicious. OTHER: None. IMPRESSION: 1. No acute intracranial process. 2. Mild parenchymal brain volume loss. 3. Mild chronic small vessel ischemic changes. Severe intracranial atherosclerosis. Electronically Signed   By: Elon Alas M.D.   On: 07/24/2018 20:33   Dg Chest Portable 1 View  Result Date: 07/24/2018 CLINICAL DATA:  Altered level of consciousness. EXAM: PORTABLE CHEST 1 VIEW COMPARISON:  07/08/2018 FINDINGS: Previous median sternotomy. Aortic atherosclerosis. Mild cardiac enlargement. Moderate diffuse pulmonary edema is similar to previous  exam. No  focal airspace opacities. IMPRESSION: 1. Cardiac enlargement and pulmonary edema. 2.  Aortic Atherosclerosis (ICD10-I70.0). Electronically Signed   By: Kerby Moors M.D.   On: 07/24/2018 20:38   Labs: BMET Recent Labs  Lab 07/24/18 1827 07/25/18 0420 07/25/18 1705 07/26/18 0403  NA 137 140 139 139  K 2.7* 3.1* 3.3* 3.1*  CL 100 105 107 108  CO2 23 20* 18* 20*  GLUCOSE 153* 112* 116* 133*  BUN 84* 79* 72* 70*  CREATININE 3.90* 3.73* 3.97* 3.80*  CALCIUM 8.6* 8.5* 8.6* 8.4*  PHOS  --   --   --  5.6*   CBC Recent Labs  Lab 07/24/18 1827 07/26/18 0403  WBC 8.7 8.5  NEUTROABS  --  6.8  HGB 7.9* 7.2*  HCT 26.4* 23.6*  MCV 97.4 96.3  PLT 337 282    Medications:    . allopurinol  100 mg Oral Daily  . amLODipine  10 mg Oral Daily  . atorvastatin  40 mg Oral q1800  . carvedilol  12.5 mg Oral BID WC  . clopidogrel  75 mg Oral Q breakfast  . feeding supplement  1 Container Oral BID  . gabapentin  300 mg Oral Daily  . heparin  5,000 Units Subcutaneous Q8H  . hydrALAZINE  25 mg Oral Q8H  . mometasone-formoterol  2 puff Inhalation BID  . pantoprazole  40 mg Oral BID  . sodium chloride flush  3 mL Intravenous Q12H  . vitamin B-12  5,000 mcg Oral Daily   Elmarie Shiley, MD 07/26/2018, 8:27 AM

## 2018-07-26 NOTE — Clinical Social Work Note (Signed)
Clinical Social Work Assessment  Patient Details  Name: [name] MRN: [id number] Date of Birth: 1951-04-08  Date of referral:  07/26/18               Reason for consult:  Facility Placement, Discharge Planning                Permission sought to share information with:  Facility Sport and exercise psychologist, Family Supports Permission granted to share information::     Name::     [name]  Agency::  [facility]  Relationship::  Daughter  Contact Information:  [phone]  Housing/Transportation Living arrangements for the past 2 months:  Apartment Source of Information:  Medical Team, Adult Children Patient Interpreter Needed:  None Criminal Activity/Legal Involvement Pertinent to Current Situation/Hospitalization:  No - Comment as needed Significant Relationships:  Adult Children Lives with:  Adult Children Do you feel safe going back to the place where you live?  Yes Need for family participation in patient care:  Yes (Comment)  Care giving concerns:  PT recommending SNF once medically stable for discharge.   Social Worker assessment / plan:  Patient not fully oriented. No supports at bedside. CSW called daughter, introduced role, and explained that PT recommendations would be discussed. Daughter agreeable to SNF and stated [facility] is first preference. Patient admitted there 10/21 and was there for about 10 days before being readmitted to the hospital. Insurance would not approve her to return so patient discharged home with daughter. No further concerns. CSW encouraged patient's daughter to contact CSW as needed. CSW will continue to follow patient and her daughter for support and facilitate discharge to SNF once medically stable.  Employment status:  Retired Nurse, adult PT Recommendations:  [place] / Referral to community resources:  [place]  Patient/Family's Response to care:  Patient not fully  oriented. Patient's daughter agreeable to SNF placement. Patient's daughters supportive and involved in patient's care. Patient's daughter appreciated social work intervention.  Patient/Family's Understanding of and Emotional Response to Diagnosis, Current Treatment, and Prognosis:  Patient not fully oriented. Patient's daughter has a good understanding of the reason for admission and her need for rehab prior to returning home. Patient's daughter appears happy with hospital care.  Emotional Assessment Appearance:  Appears stated age Attitude/Demeanor/Rapport:  Unable to Assess Affect (typically observed):  Unable to Assess Orientation:  Oriented to Self, Oriented to Place Alcohol / Substance use:  Tobacco Use Psych involvement (Current and /or in the community):  No (Comment)  Discharge Needs  Concerns to be addressed:  Care Coordination Readmission within the last 30 days:  Yes Current discharge risk:  Cognitively Impaired, Dependent with Mobility Barriers to Discharge:  Continued Medical Work up, [place], LCSW 07/26/2018, 1:19 PM

## 2018-07-26 NOTE — Clinical Social Work Placement (Signed)
   CLINICAL SOCIAL WORK PLACEMENT  NOTE  Date:  07/26/2018  Patient Details  Name: [name] MRN: [id number] Date of Birth: 01/18/1951  Clinical Social Work is seeking post-discharge placement for this patient at the [place] level of care (*CSW will initial, date and re-position this form in  chart as items are completed):      Patient/family provided with [facility] Work Department's list of facilities offering this level of care within the geographic area requested by the patient (or if unable, by the patient's family).      Patient/family informed of their freedom to choose among providers that offer the needed level of care, that participate in Medicare, Medicaid or managed care program needed by the patient, have an available bed and are willing to accept the patient.      Patient/family informed of [place]'s ownership interest in [facility] and [facility], as well as of the fact that they are under no obligation to receive care at these facilities.  PASRR submitted to EDS on 07/26/18     PASRR number received on       Existing PASRR number confirmed on 07/26/18     FL2 transmitted to all facilities in geographic area requested by pt/family on 07/26/18     FL2 transmitted to all facilities within larger geographic area on       Patient informed that his/her managed care company has contracts with or will negotiate with certain facilities, including the following:            Patient/family informed of bed offers received.  Patient chooses bed at       Physician recommends and patient chooses bed at      Patient to be transferred to   on  .  Patient to be transferred to facility by       Patient family notified on   of transfer.  Name of family member notified:        PHYSICIAN Please prepare prescriptions     Additional Comment:    _______________________________________________ Candie Chroman,  LCSW 07/26/2018, 1:23 PM

## 2018-07-26 NOTE — Clinical Social Work Note (Signed)
Patient's daughters are requesting a palliative consult. Sent message to MD to notify.  Dayton Scrape, [place]

## 2018-07-27 DIAGNOSIS — N179 Acute kidney failure, unspecified: Principal | ICD-10-CM

## 2018-07-27 DIAGNOSIS — Z515 Encounter for palliative care: Secondary | ICD-10-CM

## 2018-07-27 DIAGNOSIS — N184 Chronic kidney disease, stage 4 (severe): Secondary | ICD-10-CM

## 2018-07-27 DIAGNOSIS — Z7189 Other specified counseling: Secondary | ICD-10-CM

## 2018-07-27 DIAGNOSIS — G9340 Encephalopathy, unspecified: Secondary | ICD-10-CM

## 2018-07-27 LAB — CBC
HCT: 25.2 % — ABNORMAL LOW (ref 36.0–46.0)
Hemoglobin: 7.3 g/dL — ABNORMAL LOW (ref 12.0–15.0)
MCH: 28.6 pg (ref 26.0–34.0)
MCHC: 29 g/dL — ABNORMAL LOW (ref 30.0–36.0)
MCV: 98.8 fL (ref 80.0–100.0)
NRBC: 0.2 % (ref 0.0–0.2)
PLATELETS: 262 10*3/uL (ref 150–400)
RBC: 2.55 MIL/uL — AB (ref 3.87–5.11)
RDW: 20 % — ABNORMAL HIGH (ref 11.5–15.5)
WBC: 8.3 10*3/uL (ref 4.0–10.5)

## 2018-07-27 LAB — RENAL FUNCTION PANEL
ALBUMIN: 2.3 g/dL — AB (ref 3.5–5.0)
ANION GAP: 10 (ref 5–15)
BUN: 73 mg/dL — ABNORMAL HIGH (ref 8–23)
CALCIUM: 8.9 mg/dL (ref 8.9–10.3)
CO2: 20 mmol/L — ABNORMAL LOW (ref 22–32)
Chloride: 112 mmol/L — ABNORMAL HIGH (ref 98–111)
Creatinine, Ser: 3.84 mg/dL — ABNORMAL HIGH (ref 0.44–1.00)
GFR calc non Af Amer: 11 mL/min — ABNORMAL LOW (ref >60)
GFR, EST AFRICAN AMERICAN: 13 mL/min — AB (ref >60)
Glucose, Bld: 120 mg/dL — ABNORMAL HIGH (ref 70–99)
PHOSPHORUS: 5.7 mg/dL — AB (ref 2.5–4.6)
Potassium: 3.6 mmol/L (ref 3.5–5.1)
SODIUM: 142 mmol/L (ref 135–145)

## 2018-07-27 LAB — HEPATITIS B SURFACE ANTIGEN: Hepatitis B Surface Ag: NEGATIVE

## 2018-07-27 MED ORDER — HEPARIN SODIUM (PORCINE) 1000 UNIT/ML DIALYSIS: 40.0000 [IU]/kg | INTRAMUSCULAR | Status: DC | PRN

## 2018-07-27 MED ORDER — CHLORHEXIDINE GLUCONATE CLOTH 2 % EX PADS
6.0000 | MEDICATED_PAD | Freq: Every day | CUTANEOUS | Status: DC
  Administered 2018-07-27 – 2018-08-03 (×4): 6 via TOPICAL

## 2018-07-27 NOTE — Progress Notes (Addendum)
Palliative: [name] is lying quietly on the bed in the HD suite.  She will open her eyes when I call her name, making but only briefly keeping eye contact.  She appears chronically ill and frail.  Nursing staff states that she seems to be tolerating her first dialysis session, although they had difficulty with cannulation earlier.  When I asked how she is doing, she states not so well.  We plan for follow-up meeting tomorrow when she is in her room.  I asked who speaks for her if she cannot, she tells me that her daughters are her surrogate decision makers, she names [name].  [name] is followed by hospice and palliative care of [facility], and was seen October 30 at [facility] for rehab.    Call to daughter, [name] at [phone]. We talk about [name]'s acute and chronic health concerns.  [name] talks about her father's experience with hemodialysis, she shares it was very difficult for him and he only lasted for 2 months on HD.  We briefly talked about CODE STATUS, [name] states that although they do not think it is the best choice, they respect their mother's wish for CPR and intubation/defibrillation if needed, but not for an extended amount of time in particular if she is in a vegetative state.  Daughter [name] gets on the phone and states they have discussed CODE STATUS, and are not interested in talking about it again.  She shares that they are very stressed.  She sounds overwhelmed and states she is very worried about her mother, her ability to tolerate dialysis, her functional status (which she thinks will decline).  She shares that at this point the goal is to return to [name] for rehab, but she is also concerned that [name] may lose her bed.  She had and states the ultimate goal is for their mother to return to rehab, and hopefully regain enough strength to return home.  We talked about hospice and palliative care of [facility] following [name] at [facility].   [name] states that she asks for palliative services every time her mother is in the hospital, she feels like palliative supports the family. We plan for follow-up phone call tomorrow after dialysis.  56 minutes [name], NP Palliative medicine team [phone]

## 2018-07-27 NOTE — Progress Notes (Signed)
OT Cancellation Note  Patient Details Name: [name] MRN: [id number] DOB: [birth year]   Cancelled Treatment:    Reason Eval/Treat Not Completed: Patient at procedure or test/ unavailable(Pt in HD. Will follow.)  Malka So 07/27/2018, 1:48 PM  Nestor Lewandowsky, OTR/L Acute Rehabilitation Services Pager: [phone] Office: [phone]

## 2018-07-27 NOTE — Progress Notes (Signed)
PROGRESS NOTE  [name]:[id number] DOB: 1951/02/19 DOA: 07/24/2018 PCP: Bernerd Limbo, MD   LOS: 1 day   Brief Narrative / Interim history: 67 year old female with history of abdominal aortic aneurysm status post vascular repair, chronic kidney disease stage IV, CAD, recent admission to the hospital for acute pancreatitis with pseudocyst, diastolic CHF, chronic pain syndrome who was brought to the hospital 3 to 4 days after last discharge due to persistent somnolence.  She was found to have acute kidney injury and chronic kidney disease, she was admitted to the hospital and nephrology was consulted.  Subjective: -A bit sleepy this morning but without significant complaints, knows where she is but does not answer when asked what year is it.  No chest pain, no shortness of breath  Assessment & Plan: Principal Problem:   Acute encephalopathy Active Problems:   Essential hypertension   Anemia   Acute renal failure superimposed on stage 4 chronic kidney disease (HCC)   CAD (coronary artery disease)   Chronic diastolic congestive heart failure (HCC)   Hypokalemia   Chronic pain   AKI (acute kidney injury) ([place])   Acute metabolic encephalopathy -Patient was started on narcotics during her prior hospital stay, she tolerated them well we will hospitalized however when she got home she became progressively more lethargic.  Is not clear whether she is to be this prescribed, however the daughters were at bedside tells me that they have been managing her medications and did not give her more than she was taking in the hospital.  Daughters also tells me that they did not give Klonopin to her -continue to hold benzodiazepines as well as narcotics -remains improved but somewhat still confused this morning  Acute kidney injury on chronic kidney disease stage IV -Discussed with Dr. Posey Pronto, her creatinine is not significantly getting any better with fluids and her urine output is minimal.  She  is starting to have GI symptoms with vomiting last night and having slightly more jerks and asterixis this morning -Per Dr. Posey Pronto will start hemodialysis inpatient  Hypokalemia -Supplemented, now not within normal limits.  Anemia of chronic kidney disease -Hemoglobin appears overall stable, no active bleeding, continue B12 and iron  Chronic pain syndrome -Hold pain medications, hold clonazepam and Cymbalta  Acute on chronic diastolic CHF with acute hypoxic respiratory failure -Remains fluid overloaded today with crackles and lower extremity edema, respiratory status is stable.  To be dialyzed as above -Hypoxic yesterday and today requiring supplemental oxygen  Coronary artery disease -Most recent DES in 2016, history of CABG as well.  No chest pain, continue Plavix, statin, beta-blocker  Hypertension -Blood pressure stable, continue current regimen  Status post recent hospitalization for acute pancreatitis with peripancreatic fluid collection -No nausea vomiting or abdominal pain currently.  Abdominal aortic aneurysm /PAD -Status post repair with stent to the common iliac artery, continue Plavix    Scheduled Meds: . allopurinol  100 mg Oral Daily  . amLODipine  10 mg Oral Daily  . atorvastatin  40 mg Oral q1800  . carvedilol  12.5 mg Oral BID WC  . Chlorhexidine Gluconate Cloth  6 each Topical Q0600  . clopidogrel  75 mg Oral Q breakfast  . feeding supplement  1 Container Oral TID BM  . feeding supplement (PRO-STAT SUGAR FREE 64)  30 mL Oral TID  . gabapentin  300 mg Oral Daily  . heparin  5,000 Units Subcutaneous Q8H  . hydrALAZINE  25 mg Oral Q8H  . mometasone-formoterol  2  puff Inhalation BID  . multivitamin  15 mL Oral Daily  . pantoprazole  40 mg Oral BID  . sodium chloride flush  3 mL Intravenous Q12H   Continuous Infusions:  PRN Meds:.acetaminophen **OR** acetaminophen, naLOXone (NARCAN)  injection, ondansetron **OR** ondansetron (ZOFRAN) IV  DVT  prophylaxis: heparin Code Status: Full Family Communication: No family at bedside this morning Disposition Plan: TBD  Consultants:   Nephrology   Procedures:   None   Antimicrobials:  None    Objective: Vitals:   07/27/18 0338 07/27/18 0407 07/27/18 0633 07/27/18 0800  BP: (!) 139/41   (!) 153/55  Pulse: 61 62  61  Resp: 16   18  Temp: 98.7 F (37.1 C)   97.9 F (36.6 C)  TempSrc: Oral   Oral  SpO2: (!) 89% 95%  98%  Weight:   60.1 kg   Height:        Intake/Output Summary (Last 24 hours) at 07/27/2018 1000 Last data filed at 07/27/2018 0936 Gross per 24 hour  Intake 980.92 ml  Output 200 ml  Net 780.92 ml   Filed Weights   07/25/18 1542 07/26/18 0123 07/27/18 0633  Weight: 58.3 kg 56.5 kg 60.1 kg    Examination:  Constitutional: Does not appear to be in any distress Eyes: No scleral icterus ENMT: Moist mucous membranes Respiratory: Bibasilar crackles present without wheezing.  Good air movement. Cardiovascular: Regular rate and rhythm, holosystolic murmur.  1-2+ pitting lower extremity edema Abdomen: Soft, nontender, nondistended, bowel sounds positive Musculoskeletal: No clubbing Skin: No rashes seen Neurologic: No focal deficits, equal strength.  Intermittent jerking movements upper and lower extremities Psychiatric: Slow to respond but alert and oriented to place and person, not to time  Data Reviewed: I have independently reviewed following labs and imaging studies   CBC: Recent Labs  Lab 07/24/18 1827 07/25/18 0420 07/26/18 0403 07/27/18 0524  WBC 8.7  --  8.5 8.3  NEUTROABS  --   --  6.8  --   HGB 7.9*  --  7.2* 7.3*  HCT 26.4* 22.7* 23.6* 25.2*  MCV 97.4  --  96.3 98.8  PLT 337  --  282 010   Basic Metabolic Panel: Recent Labs  Lab 07/24/18 1827 07/24/18 1954 07/25/18 0420 07/25/18 1705 07/26/18 0403 07/27/18 0524  NA 137  --  140 139 139 142  K 2.7*  --  3.1* 3.3* 3.1* 3.6  CL 100  --  105 107 108 112*  CO2 23  --  20* 18*  20* 20*  GLUCOSE 153*  --  112* 116* 133* 120*  BUN 84*  --  79* 72* 70* 73*  CREATININE 3.90*  --  3.73* 3.97* 3.80* 3.84*  CALCIUM 8.6*  --  8.5* 8.6* 8.4* 8.9  MG  --  1.9  --   --   --   --   PHOS  --   --   --   --  5.6* 5.7*   GFR: Estimated Creatinine Clearance: 13.7 mL/min (A) (by C-G formula based on SCr of 3.84 mg/dL (H)). Liver Function Tests: Recent Labs  Lab 07/24/18 1827 07/26/18 0403 07/27/18 0524  AST 15  --   --   ALT 8  --   --   ALKPHOS 51  --   --   BILITOT 0.8  --   --   PROT 6.1*  --   --   ALBUMIN 3.0* 2.4* 2.3*   No results for input(s): LIPASE, AMYLASE in  the last 168 hours. Recent Labs  Lab 07/24/18 1954 07/25/18 0420  AMMONIA 41* 19   Coagulation Profile: No results for input(s): INR, PROTIME in the last 168 hours. Cardiac Enzymes: No results for input(s): CKTOTAL, CKMB, CKMBINDEX, TROPONINI in the last 168 hours. BNP (last 3 results) No results for input(s): PROBNP in the last 8760 hours. HbA1C: No results for input(s): HGBA1C in the last 72 hours. CBG: Recent Labs  Lab 07/26/18 1158  GLUCAP 145*   Lipid Profile: No results for input(s): CHOL, HDL, LDLCALC, TRIG, CHOLHDL, LDLDIRECT in the last 72 hours. Thyroid Function Tests: Recent Labs    07/25/18 0420  TSH 3.376   Anemia Panel: Recent Labs    07/25/18 0420 07/25/18 1705  VITAMINB12 1,134*  --   FERRITIN  --  1,240*  TIBC  --  375  IRON  --  35   Urine analysis:    Component Value Date/Time   COLORURINE YELLOW 07/25/2018 0530   APPEARANCEUR CLEAR 07/25/2018 0530   LABSPEC 1.008 07/25/2018 0530   PHURINE 5.0 07/25/2018 0530   GLUCOSEU NEGATIVE 07/25/2018 0530   HGBUR NEGATIVE 07/25/2018 0530   BILIRUBINUR NEGATIVE 07/25/2018 0530   KETONESUR NEGATIVE 07/25/2018 0530   PROTEINUR NEGATIVE 07/25/2018 0530   UROBILINOGEN 0.2 03/04/2011 2107   NITRITE NEGATIVE 07/25/2018 0530   LEUKOCYTESUR TRACE (A) 07/25/2018 0530   Sepsis Labs: Invalid input(s): PROCALCITONIN,  LACTICIDVEN  No results found for this or any previous visit (from the past 240 hour(s)).    Radiology Studies: Ct Head Wo Contrast  Result Date: 07/24/2018 CLINICAL DATA:  Altered mental status, somnolent. History of anemia, pancreatitis. EXAM: CT HEAD WITHOUT CONTRAST TECHNIQUE: Contiguous axial images were obtained from the base of the skull through the vertex without intravenous contrast. COMPARISON:  CT HEAD June 30, 2018 FINDINGS: BRAIN: No intraparenchymal hemorrhage, mass effect nor midline shift. Mild parenchymal brain volume loss. Patchy supratentorial white matter hypodensities within normal range for patient's age, though non-specific are most compatible with chronic small vessel ischemic disease. No acute large vascular territory infarcts. No abnormal extra-axial fluid collections. Basal cisterns are patent. VASCULAR: Severe calcific atherosclerosis of the carotid siphons. SKULL: No skull fracture. No significant scalp soft tissue swelling. SINUSES/ORBITS: Trace paranasal sinus mucosal thickening. Mastoid air cells are well aerated.The included ocular globes and orbital contents are non-suspicious. OTHER: None. IMPRESSION: 1. No acute intracranial process. 2. Mild parenchymal brain volume loss. 3. Mild chronic small vessel ischemic changes. Severe intracranial atherosclerosis. Electronically Signed   By: Elon Alas M.D.   On: 07/24/2018 20:33   Dg Chest Portable 1 View  Result Date: 07/24/2018 CLINICAL DATA:  Altered level of consciousness. EXAM: PORTABLE CHEST 1 VIEW COMPARISON:  07/08/2018 FINDINGS: Previous median sternotomy. Aortic atherosclerosis. Mild cardiac enlargement. Moderate diffuse pulmonary edema is similar to previous exam. No focal airspace opacities. IMPRESSION: 1. Cardiac enlargement and pulmonary edema. 2.  Aortic Atherosclerosis (ICD10-I70.0). Electronically Signed   By: Kerby Moors M.D.   On: 07/24/2018 20:38     Marzetta Board, MD, PhD [facility] Pager [phone]  If 7PM-7AM, please contact night-coverage [email or web address] Password [email or web address] 07/27/2018, 10:00 AM

## 2018-07-27 NOTE — Progress Notes (Signed)
Patient ID: [name], female   DOB: 04/07/1951, 67 y.o.   MRN: [id number] [facility] Progress Note   Assessment/ Plan:   1.  Acute kidney injury on chronic kidney disease stage IV: This appears to be possibly hemodynamically mediated acute kidney injury with her recent changes in mentation leading to decreased oral intake in the setting of ongoing diuretic use-unfortunately, marginal urine output overnight with persistently elevated BUN/creatinine and poor oral intake.  Given her asterixis, will order for hemodialysis-unclear if this is due to gabapentin toxicity or whether it is truly uremic and she may be able to come off of dialysis temporarily. 2.  Altered level of consciousness: This appears to be primarily from polypharmacy with multiple agents likely contributing to her recent somnolence/decreased responsiveness.    Narcotic analgesic/benzodiazepines on hold. 3.  Hypertension: Hold diuretics at this time and monitor on current antihypertensive therapy. 4.  Hypokalemia: Secondary to diuretic induced losses with decreased oral intake, replaced via oral route. 5.  Anemia: Restart Aranesp with hemodialysis today. 6.  Chronic protein calorie malnutrition/deconditioning: Secondary to chronic pancreatitis and recent prolonged hospitalization-agree with continued nutritional supplements and reevaluation by PT/OT for outpatient needs.  Subjective:   Reports to be possibly doing somewhat better based on her daughter's assessment-patient still denies any complaints.   Objective:   BP (!) 139/41 (BP Location: Right Arm)   Pulse 62   Temp 98.7 F (37.1 C) (Oral)   Resp 16   Ht 5\' 6"  (1.676 m)   Wt 60.1 kg   SpO2 95%   BMI 21.40 kg/m   Intake/Output Summary (Last 24 hours) at 07/27/2018 0823 Last data filed at 07/26/2018 1935 Gross per 24 hour  Intake 977.92 ml  Output 200 ml  Net 777.92 ml   Weight change: 1.847 kg  Physical Exam: Gen: Comfortably resting in bed,  still having delayed response to questions. CVS: Pulse regular rhythm, normal rate, S1 and S2 normal Resp: Diminished breath sounds over bases-poor inspiratory effort.  No rales Abd: Soft, flat, nontender Ext: 1+ bilateral pitting edema.  Asterixis noted.  Left BCF with fair thrill.  Imaging: No results found. Labs: BMET Recent Labs  Lab 07/24/18 1827 07/25/18 0420 07/25/18 1705 07/26/18 0403 07/27/18 0524  NA 137 140 139 139 142  K 2.7* 3.1* 3.3* 3.1* 3.6  CL 100 105 107 108 112*  CO2 23 20* 18* 20* 20*  GLUCOSE 153* 112* 116* 133* 120*  BUN 84* 79* 72* 70* 73*  CREATININE 3.90* 3.73* 3.97* 3.80* 3.84*  CALCIUM 8.6* 8.5* 8.6* 8.4* 8.9  PHOS  --   --   --  5.6* 5.7*   CBC Recent Labs  Lab 07/24/18 1827 07/25/18 0420 07/26/18 0403 07/27/18 0524  WBC 8.7  --  8.5 8.3  NEUTROABS  --   --  6.8  --   HGB 7.9*  --  7.2* 7.3*  HCT 26.4* 22.7* 23.6* 25.2*  MCV 97.4  --  96.3 98.8  PLT 337  --  282 262    Medications:    . allopurinol  100 mg Oral Daily  . amLODipine  10 mg Oral Daily  . atorvastatin  40 mg Oral q1800  . carvedilol  12.5 mg Oral BID WC  . clopidogrel  75 mg Oral Q breakfast  . feeding supplement  1 Container Oral TID BM  . feeding supplement (PRO-STAT SUGAR FREE 64)  30 mL Oral TID  . gabapentin  300 mg Oral Daily  .  heparin  5,000 Units Subcutaneous Q8H  . hydrALAZINE  25 mg Oral Q8H  . mometasone-formoterol  2 puff Inhalation BID  . multivitamin  15 mL Oral Daily  . pantoprazole  40 mg Oral BID  . sodium chloride flush  3 mL Intravenous Q12H   Elmarie Shiley, MD 07/27/2018, 8:23 AM

## 2018-07-28 LAB — BASIC METABOLIC PANEL
Anion gap: 10 (ref 5–15)
BUN: 47 mg/dL — AB (ref 8–23)
CO2: 21 mmol/L — ABNORMAL LOW (ref 22–32)
Calcium: 8.3 mg/dL — ABNORMAL LOW (ref 8.9–10.3)
Chloride: 107 mmol/L (ref 98–111)
Creatinine, Ser: 2.9 mg/dL — ABNORMAL HIGH (ref 0.44–1.00)
GFR calc Af Amer: 18 mL/min — ABNORMAL LOW (ref >60)
GFR, EST NON AFRICAN AMERICAN: 16 mL/min — AB (ref >60)
GLUCOSE: 157 mg/dL — AB (ref 70–99)
Potassium: 3.5 mmol/L (ref 3.5–5.1)
Sodium: 138 mmol/L (ref 135–145)

## 2018-07-28 LAB — CBC
HCT: 24.6 % — ABNORMAL LOW (ref 36.0–46.0)
HEMOGLOBIN: 7.3 g/dL — AB (ref 12.0–15.0)
MCH: 29.2 pg (ref 26.0–34.0)
MCHC: 29.7 g/dL — AB (ref 30.0–36.0)
MCV: 98.4 fL (ref 80.0–100.0)
Platelets: 204 10*3/uL (ref 150–400)
RBC: 2.5 MIL/uL — ABNORMAL LOW (ref 3.87–5.11)
RDW: 19.9 % — ABNORMAL HIGH (ref 11.5–15.5)
WBC: 11.8 10*3/uL — ABNORMAL HIGH (ref 4.0–10.5)
nRBC: 0.2 % (ref 0.0–0.2)

## 2018-07-28 LAB — HEPATITIS B SURFACE ANTIBODY,QUALITATIVE: HEP B S AB: NONREACTIVE

## 2018-07-28 LAB — HEPATITIS B CORE ANTIBODY, TOTAL: HEP B C TOTAL AB: NEGATIVE

## 2018-07-28 MED ORDER — DARBEPOETIN ALFA 60 MCG/0.3ML IJ SOSY
60.0000 ug | PREFILLED_SYRINGE | INTRAMUSCULAR | Status: DC
  Filled 2018-07-28: qty 0.3

## 2018-07-28 MED ORDER — RENA-VITE PO TABS
1.0000 | ORAL_TABLET | Freq: Every day | ORAL | Status: DC
  Administered 2018-07-28 – 2018-08-02 (×6): 1 via ORAL
  Filled 2018-07-28 (×7): qty 1

## 2018-07-28 MED ORDER — ADULT MULTIVITAMIN W/MINERALS CH: 1.0000 | ORAL_TABLET | Freq: Every day | ORAL | Status: DC

## 2018-07-28 NOTE — Procedures (Signed)
Patient seen on Hemodialysis. QB 250, UF goal 0.5L Treatment #2 today--will monitor over weekend.  Elmarie Shiley MD [facility]. Office # [phone] Pager # [phone] 2:30 PM

## 2018-07-28 NOTE — Progress Notes (Signed)
Physical Therapy Treatment Patient Details Name: [name] MRN: [id number] DOB: [birth year] Today's Date: 07/28/2018    History of Present Illness Pt is a 67 y.o. female admitted to [facility] 07/24/18 with lethargy and AMS; transferred to [facility]. Worked up for AKI on CKD IV. Altered level of consciousness attributed to likely polypharmacy. HD initiated 11/21. PMH includes HTN, CKD IV-V, neuropathy, CAD (s/p CABG), PVD, chronic back pain, COPD. Of note, recent admission from [facility] to [facility] 11/2-11/13 with RUQ pain.   PT Comments    Pt progressing with mobility. Much more alert and interactive this session compared to Wednesday's evaluation. Requires min guard to minA for safe mobility; remains limited by generalized weakness, fatigue and decreased activity tolerance. Daughters present and supportive. Continue to recommend SNF-level therapies to maximize functional mobility and independence prior to return home.    Follow Up Recommendations  SNF;Supervision/Assistance - 24 hour     Equipment Recommendations  None recommended by PT    Recommendations for Other Services       Precautions / Restrictions Precautions Precautions: Fall Restrictions Weight Bearing Restrictions: No    Mobility  Bed Mobility Overal bed mobility: Needs Assistance Bed Mobility: Supine to Sit;Sit to Supine     Supine to sit: Min assist Sit to supine: Supervision   General bed mobility comments: MinA for trunk elevation; increased time and effort  Transfers Overall transfer level: Needs assistance Equipment used: Rolling walker (2 wheeled) Transfers: Sit to/from Stand Sit to Stand: Min guard         General transfer comment: Heavy reliance on UE support; min guard for balance  Ambulation/Gait Ambulation/Gait assistance: Min guard Gait Distance (Feet): 60 Feet Assistive device: Rolling walker (2 wheeled) Gait Pattern/deviations: Step-to pattern;Trunk flexed Gait velocity: Decreased Gait  velocity interpretation: <1.31 ft/sec, indicative of household ambulator General Gait Details: Slow, labored ambulation with RW and min guard for balance. Pt intermittently running into door/wall with RW; body tremors noted 2x when attempting to correct this.    Stairs             Wheelchair Mobility    Modified Rankin (Stroke Patients Only)       Balance Overall balance assessment: Needs assistance Sitting-balance support: Feet supported;Bilateral upper extremity supported Sitting balance-Leahy Scale: Fair Sitting balance - Comments: Able to don socks sitting EOB; increased time and effort     Standing balance-Leahy Scale: Poor Standing balance comment: Reliant on UE support                            Cognition Arousal/Alertness: Awake/alert Behavior During Therapy: Flat affect Overall Cognitive Status: Impaired/Different from baseline Area of Impairment: Attention;Following commands;Problem solving                   Current Attention Level: Selective   Following Commands: Follows one step commands consistently     Problem Solving: Slow processing;Decreased initiation;Requires verbal cues;Requires tactile cues        Exercises      General Comments General comments (skin integrity, edema, etc.): Daughters present      Pertinent Vitals/Pain Pain Assessment: Faces Faces Pain Scale: Hurts a little bit Pain Location: Buttock sore Pain Descriptors / Indicators: Grimacing Pain Intervention(s): Monitored during session;Repositioned    Home Living                      Prior Function  PT Goals (current goals can now be found in the care plan section) Acute Rehab PT Goals Patient Stated Goal: Get stronger at SNF before returning to daughter's home; "get a cigarette" PT Goal Formulation: With patient/family Time For Goal Achievement: 08/09/18 Potential to Achieve Goals: Good Progress towards PT goals: Progressing  toward goals    Frequency    Min 2X/week      PT Plan Current plan remains appropriate    Co-evaluation              AM-PAC PT "6 Clicks" Daily Activity  Outcome Measure  Difficulty turning over in bed (including adjusting bedclothes, sheets and blankets)?: None Difficulty moving from lying on back to sitting on the side of the bed? : A Little Difficulty sitting down on and standing up from a chair with arms (e.g., wheelchair, bedside commode, etc,.)?: Unable Help needed moving to and from a bed to chair (including a wheelchair)?: A Little Help needed walking in hospital room?: A Little Help needed climbing 3-5 steps with a railing? : A Lot 6 Click Score: 16    End of Session Equipment Utilized During Treatment: Gait belt Activity Tolerance: Patient tolerated treatment well;Patient limited by fatigue Patient left: in bed;with call bell/phone within reach;with family/visitor present Nurse Communication: Mobility status PT Visit Diagnosis: Muscle weakness (generalized) (M62.81);Other abnormalities of gait and mobility (R26.89)     Time: 5277-8242 PT Time Calculation (min) (ACUTE ONLY): 15 min  Charges:  $Gait Training: 8-22 mins                    Mabeline Caras, PT, DPT Acute Rehabilitation Services  Pager [phone] Office [place] 07/28/2018, 12:18 PM

## 2018-07-28 NOTE — Plan of Care (Signed)
  Problem: Education: Goal: Knowledge of General Education information will improve Description Including pain rating scale, medication(s)/side effects and non-pharmacologic comfort measures Outcome: Progressing   Problem: Clinical Measurements: Goal: Respiratory complications will improve Outcome: Progressing   Problem: Activity: Goal: Risk for activity intolerance will decrease Outcome: Progressing   Problem: Coping: Goal: Level of anxiety will decrease Outcome: Progressing   Problem: Pain Managment: Goal: General experience of comfort will improve Outcome: Progressing   

## 2018-07-28 NOTE — Progress Notes (Signed)
Patient ID: [name], female   DOB: 01/01/1951, 67 y.o.   MRN: [id number] [facility] Progress Note   Assessment/ Plan:   1.  Acute kidney injury on chronic kidney disease stage IV-possibly progression to ESRD: Started on hemodialysis yesterday because of worsening asterixis, fixed urine output/unchanged BUN/creatinine with fluids.  It is unclear whether this is long-term hemodialysis but is being treated as such maintaining cautious optimism that she might have some element of renal recovery allowing her more time off of hemodialysis.  On the other hand, she might require a tunneled hemodialysis catheter if current fistula infiltration limits cannulation/dialysis delivery. 2.  Altered level of consciousness: This appears to be primarily from polypharmacy with multiple agents likely contributing to her recent somnolence/decreased responsiveness.  Appears somewhat better this morning with hemodialysis. 3.  Hypertension: Hold diuretics at this time and monitor on current antihypertensive therapy. 4.  Hypokalemia: Secondary to diuretic induced losses with decreased oral intake, monitor with hemodialysis. 5.  Anemia: Restart Aranesp with hemodialysis today. 6.  Chronic protein calorie malnutrition/deconditioning: Secondary to chronic pancreatitis and recent prolonged hospitalization-agree with continued nutritional supplements and reevaluation by PT/OT for outpatient needs.  Subjective:   Had infiltration of her fistula yesterday but ultimately underwent hemodialysis.   Objective:   BP (!) 136/41 (BP Location: Right Arm)   Pulse (!) 59   Temp 98.4 F (36.9 C) (Oral)   Resp 18   Ht 5\' 6"  (1.676 m)   Wt 59.8 kg Comment: scale c  SpO2 100%   BMI 21.28 kg/m   Intake/Output Summary (Last 24 hours) at 07/28/2018 0815 Last data filed at 07/28/2018 0546 Gross per 24 hour  Intake 723 ml  Output 685 ml  Net 38 ml   Weight change: -0.547 kg  Physical Exam: Gen: Comfortably  sleeping in bed, awakens easily and answers questions appropriately. CVS: Pulse regular rhythm, normal rate, S1 and S2 normal Resp: Decreased breath sounds over bases bilaterally.  No rales Abd: Soft, flat, nontender Ext: 1+ bilateral pitting edema.  Asterixis improved.  Left BCF with fair thrill/hematoma from infiltration.  Imaging: No results found. Labs: BMET Recent Labs  Lab 07/24/18 1827 07/25/18 0420 07/25/18 1705 07/26/18 0403 07/27/18 0524 07/28/18 0453  NA 137 140 139 139 142 138  K 2.7* 3.1* 3.3* 3.1* 3.6 3.5  CL 100 105 107 108 112* 107  CO2 23 20* 18* 20* 20* 21*  GLUCOSE 153* 112* 116* 133* 120* 157*  BUN 84* 79* 72* 70* 73* 47*  CREATININE 3.90* 3.73* 3.97* 3.80* 3.84* 2.90*  CALCIUM 8.6* 8.5* 8.6* 8.4* 8.9 8.3*  PHOS  --   --   --  5.6* 5.7*  --    CBC Recent Labs  Lab 07/24/18 1827 07/25/18 0420 07/26/18 0403 07/27/18 0524 07/28/18 0453  WBC 8.7  --  8.5 8.3 11.8*  NEUTROABS  --   --  6.8  --   --   HGB 7.9*  --  7.2* 7.3* 7.3*  HCT 26.4* 22.7* 23.6* 25.2* 24.6*  MCV 97.4  --  96.3 98.8 98.4  PLT 337  --  282 262 204    Medications:    . allopurinol  100 mg Oral Daily  . amLODipine  10 mg Oral Daily  . atorvastatin  40 mg Oral q1800  . carvedilol  12.5 mg Oral BID WC  . Chlorhexidine Gluconate Cloth  6 each Topical Q0600  . clopidogrel  75 mg Oral Q breakfast  . feeding supplement  1 Container Oral TID BM  . feeding supplement (PRO-STAT SUGAR FREE 64)  30 mL Oral TID  . gabapentin  300 mg Oral Daily  . heparin  5,000 Units Subcutaneous Q8H  . hydrALAZINE  25 mg Oral Q8H  . mometasone-formoterol  2 puff Inhalation BID  . multivitamin  15 mL Oral Daily  . pantoprazole  40 mg Oral BID  . sodium chloride flush  3 mL Intravenous Q12H   Elmarie Shiley, MD 07/28/2018, 8:15 AM

## 2018-07-28 NOTE — Progress Notes (Signed)
PROGRESS NOTE  [name] IZT:[id number] DOB: 05/27/1951 DOA: 07/24/2018 PCP: Bernerd Limbo, MD   LOS: 2 days   Brief Narrative / Interim history: 67 year old female with history of abdominal aortic aneurysm status post vascular repair, chronic kidney disease stage IV, CAD, recent admission to the hospital for acute pancreatitis with pseudocyst, diastolic CHF, chronic pain syndrome who was brought to the hospital 3 to 4 days after last discharge due to persistent somnolence.  She was found to have acute kidney injury and chronic kidney disease, she was admitted to the hospital and nephrology was consulted.  Subjective: Patient more awake, alert, oriented as confirmed by her 2 daughters who I met at bedside.  Reports bilateral lower extremity pain, which is chronic and some generalized pain.  Any chest pain, shortness of breath, abdominal pain, nausea/vomiting, fever/chills.  Assessment & Plan: Principal Problem:   Acute encephalopathy Active Problems:   Essential hypertension   Anemia   Acute renal failure superimposed on stage 4 chronic kidney disease (HCC)   CAD (coronary artery disease)   Chronic diastolic congestive heart failure (HCC)   Hypokalemia   Chronic pain   AKI (acute kidney injury) ([place])   Goals of care, counseling/discussion   DNR (do not resuscitate) discussion   Acute metabolic encephalopathy likely secondary to polypharmacy with pain meds with concomitant poor renal function Improving -Patient was started on narcotics during her prior hospital stay, she tolerated them well initially, however when she got home she became progressively more lethargic -No signs of any infection -Continue to hold benzodiazepines as well as narcotics  Acute kidney injury on chronic kidney disease stage IV with possible progression to ESRD -Nephrology on board, started hemodialysis on 07/27/2018 -May need it for short time but also may require long-term hemodialysis -HD per  nephrology  Anemia of chronic kidney disease -Hemoglobin appears overall stable, no active bleeding, continue B12 and iron -Nephrology restarting Aranesp with HD  Chronic pain syndrome -Hold pain medications, hold clonazepam and Cymbalta  Acute on chronic diastolic CHF with acute hypoxic respiratory failure Stable Echo done on 05/2018 showed EF of 60 to 38%, grade 2 diastolic dysfunction, PA peak pressure of 36 mmHg Continue hemodialysis to assist in volume overload  Coronary artery disease Chest pain-free -Most recent DES in 2016, history of CABG as well -Continue Plavix, statin, beta-blocker  Hypertension -Blood pressure stable, continue current regimen  Status post recent hospitalization for acute pancreatitis with peripancreatic fluid collection -Stable, no nausea vomiting or abdominal pain currently  Abdominal aortic aneurysm /PAD -Status post repair with stent to the common iliac artery -Continue Plavix    Scheduled Meds: . allopurinol  100 mg Oral Daily  . amLODipine  10 mg Oral Daily  . atorvastatin  40 mg Oral q1800  . carvedilol  12.5 mg Oral BID WC  . Chlorhexidine Gluconate Cloth  6 each Topical Q0600  . clopidogrel  75 mg Oral Q breakfast  . darbepoetin (ARANESP) injection - DIALYSIS  60 mcg Intravenous Q Fri-HD  . feeding supplement  1 Container Oral TID BM  . feeding supplement (PRO-STAT SUGAR FREE 64)  30 mL Oral TID  . gabapentin  300 mg Oral Daily  . heparin  5,000 Units Subcutaneous Q8H  . hydrALAZINE  25 mg Oral Q8H  . mometasone-formoterol  2 puff Inhalation BID  . multivitamin  1 tablet Oral QHS  . pantoprazole  40 mg Oral BID  . sodium chloride flush  3 mL Intravenous Q12H   Continuous Infusions:  PRN Meds:.acetaminophen **OR** acetaminophen, naLOXone (NARCAN)  injection, ondansetron **OR** ondansetron (ZOFRAN) IV  DVT prophylaxis: heparin Code Status: Full Family Communication: 2 daughters at bedside Disposition Plan: TBD  Consultants:    Nephrology   Procedures:   None  Antimicrobials:  None    Objective: Vitals:   07/28/18 1500 07/28/18 1530 07/28/18 1600 07/28/18 1604  BP: (!) (P) 119/51 (!) 129/54 (!) 141/52 (!) 140/52  Pulse: (P) 64 64 66 65  Resp: (P) '14 14 14 13  '$ Temp:    97.8 F (36.6 C)  TempSrc:    Oral  SpO2:    97%  Weight:      Height:        Intake/Output Summary (Last 24 hours) at 07/28/2018 1657 Last data filed at 07/28/2018 1604 Gross per 24 hour  Intake 603 ml  Output 1285 ml  Net -682 ml   Filed Weights   07/27/18 1724 07/28/18 0431 07/28/18 1244  Weight: 59.5 kg 59.8 kg 59 kg    Examination:  General: NAD   Cardiovascular: S1, S2 present  Respiratory:  Basilar crackles noted  Abdomen: Soft, nontender, nondistended, bowel sounds present  Musculoskeletal: 1+ bilateral lower extremity edema  Skin: Normal  Psychiatry:  Normal mood   Data Reviewed: I have independently reviewed following labs and imaging studies   CBC: Recent Labs  Lab 07/24/18 1827 07/25/18 0420 07/26/18 0403 07/27/18 0524 07/28/18 0453  WBC 8.7  --  8.5 8.3 11.8*  NEUTROABS  --   --  6.8  --   --   HGB 7.9*  --  7.2* 7.3* 7.3*  HCT 26.4* 22.7* 23.6* 25.2* 24.6*  MCV 97.4  --  96.3 98.8 98.4  PLT 337  --  282 262 671   Basic Metabolic Panel: Recent Labs  Lab 07/24/18 1954 07/25/18 0420 07/25/18 1705 07/26/18 0403 07/27/18 0524 07/28/18 0453  NA  --  140 139 139 142 138  K  --  3.1* 3.3* 3.1* 3.6 3.5  CL  --  105 107 108 112* 107  CO2  --  20* 18* 20* 20* 21*  GLUCOSE  --  112* 116* 133* 120* 157*  BUN  --  79* 72* 70* 73* 47*  CREATININE  --  3.73* 3.97* 3.80* 3.84* 2.90*  CALCIUM  --  8.5* 8.6* 8.4* 8.9 8.3*  MG 1.9  --   --   --   --   --   PHOS  --   --   --  5.6* 5.7*  --    GFR: Estimated Creatinine Clearance: 18 mL/min (A) (by C-G formula based on SCr of 2.9 mg/dL (H)). Liver Function Tests: Recent Labs  Lab 07/24/18 1827 07/26/18 0403 07/27/18 0524  AST 15  --    --   ALT 8  --   --   ALKPHOS 51  --   --   BILITOT 0.8  --   --   PROT 6.1*  --   --   ALBUMIN 3.0* 2.4* 2.3*   No results for input(s): LIPASE, AMYLASE in the last 168 hours. Recent Labs  Lab 07/24/18 1954 07/25/18 0420  AMMONIA 41* 19   Coagulation Profile: No results for input(s): INR, PROTIME in the last 168 hours. Cardiac Enzymes: No results for input(s): CKTOTAL, CKMB, CKMBINDEX, TROPONINI in the last 168 hours. BNP (last 3 results) No results for input(s): PROBNP in the last 8760 hours. HbA1C: No results for input(s): HGBA1C in the last 72 hours. CBG:  Recent Labs  Lab 07/26/18 1158  GLUCAP 145*   Lipid Profile: No results for input(s): CHOL, HDL, LDLCALC, TRIG, CHOLHDL, LDLDIRECT in the last 72 hours. Thyroid Function Tests: No results for input(s): TSH, T4TOTAL, FREET4, T3FREE, THYROIDAB in the last 72 hours. Anemia Panel: Recent Labs    07/25/18 1705  FERRITIN 1,240*  TIBC 375  IRON 35   Urine analysis:    Component Value Date/Time   COLORURINE YELLOW 07/25/2018 0530   APPEARANCEUR CLEAR 07/25/2018 0530   LABSPEC 1.008 07/25/2018 0530   PHURINE 5.0 07/25/2018 0530   GLUCOSEU NEGATIVE 07/25/2018 0530   HGBUR NEGATIVE 07/25/2018 0530   BILIRUBINUR NEGATIVE 07/25/2018 0530   KETONESUR NEGATIVE 07/25/2018 0530   PROTEINUR NEGATIVE 07/25/2018 0530   UROBILINOGEN 0.2 03/04/2011 2107   NITRITE NEGATIVE 07/25/2018 0530   LEUKOCYTESUR TRACE (A) 07/25/2018 0530   Sepsis Labs: Invalid input(s): PROCALCITONIN, LACTICIDVEN  No results found for this or any previous visit (from the past 240 hour(s)).    Radiology Studies: Ct Head Wo Contrast  Result Date: 07/24/2018 CLINICAL DATA:  Altered mental status, somnolent. History of anemia, pancreatitis. EXAM: CT HEAD WITHOUT CONTRAST TECHNIQUE: Contiguous axial images were obtained from the base of the skull through the vertex without intravenous contrast. COMPARISON:  CT HEAD June 30, 2018 FINDINGS: BRAIN:  No intraparenchymal hemorrhage, mass effect nor midline shift. Mild parenchymal brain volume loss. Patchy supratentorial white matter hypodensities within normal range for patient's age, though non-specific are most compatible with chronic small vessel ischemic disease. No acute large vascular territory infarcts. No abnormal extra-axial fluid collections. Basal cisterns are patent. VASCULAR: Severe calcific atherosclerosis of the carotid siphons. SKULL: No skull fracture. No significant scalp soft tissue swelling. SINUSES/ORBITS: Trace paranasal sinus mucosal thickening. Mastoid air cells are well aerated.The included ocular globes and orbital contents are non-suspicious. OTHER: None. IMPRESSION: 1. No acute intracranial process. 2. Mild parenchymal brain volume loss. 3. Mild chronic small vessel ischemic changes. Severe intracranial atherosclerosis. Electronically Signed   By: Elon Alas M.D.   On: 07/24/2018 20:33   Dg Chest Portable 1 View  Result Date: 07/24/2018 CLINICAL DATA:  Altered level of consciousness. EXAM: PORTABLE CHEST 1 VIEW COMPARISON:  07/08/2018 FINDINGS: Previous median sternotomy. Aortic atherosclerosis. Mild cardiac enlargement. Moderate diffuse pulmonary edema is similar to previous exam. No focal airspace opacities. IMPRESSION: 1. Cardiac enlargement and pulmonary edema. 2.  Aortic Atherosclerosis (ICD10-I70.0). Electronically Signed   By: Kerby Moors M.D.   On: 07/24/2018 20:38     [name]  If 7PM-7AM, please contact night-coverage [email or web address] 07/28/2018, 4:57 PM

## 2018-07-28 NOTE — Care Management Important Message (Signed)
Important Message  Patient Details  Name: [name] MRN: [id number] Date of Birth: 04-Jun-1951   Medicare Important Message Given:  Yes    [name] 07/28/2018, 2:46 PM

## 2018-07-28 NOTE — Evaluation (Signed)
Occupational Therapy Evaluation Patient Details Name: [name] MRN: [id number] DOB: [birth year] Today's Date: 07/28/2018    History of Present Illness Pt is a 67 y.o. female admitted to [facility] 07/24/18 with lethargy and AMS; transferred to [facility]. Worked up for AKI on CKD IV. Altered level of consciousness attributed to likely polypharmacy. HD initiated 11/21. PMH includes HTN, CKD IV-V, neuropathy, CAD (s/p CABG), PVD, chronic back pain, COPD. Of note, recent admission from [facility] to [facility] 11/2-11/13 with RUQ pain.   Clinical Impression   Pt was needing increasingly more help with ADL and mobility since August. Presents with generalized weakness, impaired cognition and decreased standing balance. She fatigues easily. Pt requires min assist and increased time for mobility and ADL. She will need ST rehab in SNF prior to return home. Will follow acutely.    Follow Up Recommendations  SNF    Equipment Recommendations  None recommended by OT    Recommendations for Other Services       Precautions / Restrictions Precautions Precautions: Fall Restrictions Weight Bearing Restrictions: No      Mobility Bed Mobility Overal bed mobility: Needs Assistance Bed Mobility: Supine to Sit;Sit to Supine     Supine to sit: Min guard Sit to supine: Min assist   General bed mobility comments: very minimal assist to return to supine, increased time, cues to use rail to assist with raising trunk  Transfers Overall transfer level: Needs assistance Equipment used: Rolling walker (2 wheeled) Transfers: Sit to/from Stand Sit to Stand: Min guard         General transfer comment: increased time, min guard for balance, cues for upright posture/head    Balance Overall balance assessment: Needs assistance Sitting-balance support: Feet supported;Bilateral upper extremity supported Sitting balance-Leahy Scale: Fair Sitting balance - Comments: no LOB with donning socks     Standing  balance-Leahy Scale: Fair Standing balance comment: can stand statically at sink without leaning or LOB                           ADL either performed or assessed with clinical judgement   ADL Overall ADL's : Needs assistance/impaired Eating/Feeding: Set up;Sitting   Grooming: Wash/dry hands;Brushing hair;Standing;Min guard   Upper Body Bathing: Minimal assistance;Sitting   Lower Body Bathing: Minimal assistance;Sit to/from stand   Upper Body Dressing : Minimal assistance;Sitting   Lower Body Dressing: Minimal assistance;Sit to/from stand   Toilet Transfer: Minimal assistance;Ambulation;RW   Toileting- Clothing Manipulation and Hygiene: Minimal assistance;Sit to/from stand   Tub/ Shower Transfer: Minimal assistance;Ambulation;Rolling walker   Functional mobility during ADLs: Minimal assistance;Rolling walker General ADL Comments: daughters present, stating pt is much improved since HD initiated     Vision Baseline Vision/History: No visual deficits Patient Visual Report: No change from baseline       Perception     Praxis      Pertinent Vitals/Pain Pain Assessment: Faces Faces Pain Scale: No hurt Pain Location: Buttock sore Pain Descriptors / Indicators: Grimacing Pain Intervention(s): Monitored during session;Repositioned     Hand Dominance Right   Extremity/Trunk Assessment Upper Extremity Assessment Upper Extremity Assessment: Generalized weakness   Lower Extremity Assessment Lower Extremity Assessment: Defer to PT evaluation   Cervical / Trunk Assessment Cervical / Trunk Assessment: Kyphotic(with forward head)   Communication Communication Communication: No difficulties   Cognition Arousal/Alertness: Awake/alert Behavior During Therapy: Flat affect Overall Cognitive Status: Impaired/Different from baseline Area of Impairment: Problem solving;Attention  Current Attention Level: Selective   Following Commands:  Follows one step commands consistently Safety/Judgement: Decreased awareness of deficits;Decreased awareness of safety   Problem Solving: Slow processing     General Comments  Daughters present    Exercises     Shoulder Instructions      Home Living   Living Arrangements: Children Available Help at Discharge: Family;Available 24 hours/day Type of Home: Apartment Home Access: Ramped entrance     Home Layout: One level     Bathroom Shower/Tub: Occupational psychologist: Standard     Home Equipment: Environmental consultant - 4 wheels;Bedside commode;Shower seat - built in;Transport chair          Prior Functioning/Environment Level of Independence: Needs assistance  Gait / Transfers Assistance Needed: Pt has been home with daughter since hospital d/c one week ago; was mod indep with RW up until requiring increased assist a few days ago ADL's / Homemaking Assistance Needed: Was indep with pericare after hospital d/c, but needed assist when symptoms started a few days ago            OT Problem List: Decreased strength;Decreased activity tolerance;Impaired balance (sitting and/or standing);Pain      OT Treatment/Interventions: Self-care/ADL training;Therapeutic activities;Patient/family education;Balance training;Cognitive remediation/compensation    OT Goals(Current goals can be found in the care plan section) Acute Rehab OT Goals Patient Stated Goal: Get stronger at SNF before returning to daughter's home; "get a cigarette" OT Goal Formulation: With patient Time For Goal Achievement: 08/11/18 Potential to Achieve Goals: Good ADL Goals Pt Will Perform Grooming: standing;with supervision Pt Will Perform Upper Body Bathing: with set-up;sitting Pt Will Perform Lower Body Bathing: with supervision;sit to/from stand Pt Will Perform Upper Body Dressing: with set-up;sitting Pt Will Perform Lower Body Dressing: with supervision;sit to/from stand Pt Will Transfer to Toilet: with  supervision;ambulating;bedside commode(over toilet) Pt Will Perform Toileting - Clothing Manipulation and hygiene: with supervision;sit to/from stand  OT Frequency: Min 2X/week   Barriers to D/C:            Co-evaluation              AM-PAC PT "6 Clicks" Daily Activity     Outcome Measure Help from another person eating meals?: A Little Help from another person taking care of personal grooming?: A Little Help from another person toileting, which includes using toliet, bedpan, or urinal?: A Little Help from another person bathing (including washing, rinsing, drying)?: A Little Help from another person to put on and taking off regular upper body clothing?: A Little Help from another person to put on and taking off regular lower body clothing?: A Little 6 Click Score: 18   End of Session Equipment Utilized During Treatment: Gait belt;Rolling walker  Activity Tolerance: Patient tolerated treatment well Patient left: in bed;with call bell/phone within reach;with bed alarm set;with family/visitor present  OT Visit Diagnosis: Muscle weakness (generalized) (M62.81)                Time: 1014-1030 OT Time Calculation (min): 16 min Charges:  OT General Charges $OT Visit: 1 Visit OT Evaluation $OT Eval Moderate Complexity: 1 Mod  Nestor Lewandowsky, OTR/L Acute Rehabilitation Services Pager: [phone] Office: [phone]  Malka So 07/28/2018, 1:38 PM

## 2018-07-29 LAB — CBC
HCT: 29.5 % — ABNORMAL LOW (ref 36.0–46.0)
Hemoglobin: 9.4 g/dL — ABNORMAL LOW (ref 12.0–15.0)
MCH: 28.6 pg (ref 26.0–34.0)
MCHC: 31.9 g/dL (ref 30.0–36.0)
MCV: 89.7 fL (ref 80.0–100.0)
NRBC: 0 % (ref 0.0–0.2)
PLATELETS: 177 10*3/uL (ref 150–400)
RBC: 3.29 MIL/uL — ABNORMAL LOW (ref 3.87–5.11)
RDW: 20.7 % — AB (ref 11.5–15.5)
WBC: 6.8 10*3/uL (ref 4.0–10.5)

## 2018-07-29 LAB — PREPARE RBC (CROSSMATCH)

## 2018-07-29 LAB — RENAL FUNCTION PANEL
ALBUMIN: 2.2 g/dL — AB (ref 3.5–5.0)
Anion gap: 7 (ref 5–15)
BUN: 31 mg/dL — AB (ref 8–23)
CALCIUM: 7.8 mg/dL — AB (ref 8.9–10.3)
CO2: 26 mmol/L (ref 22–32)
CREATININE: 2.17 mg/dL — AB (ref 0.44–1.00)
Chloride: 101 mmol/L (ref 98–111)
GFR calc Af Amer: 26 mL/min — ABNORMAL LOW (ref >60)
GFR, EST NON AFRICAN AMERICAN: 23 mL/min — AB (ref >60)
Glucose, Bld: 116 mg/dL — ABNORMAL HIGH (ref 70–99)
PHOSPHORUS: 3 mg/dL (ref 2.5–4.6)
POTASSIUM: 3.4 mmol/L — AB (ref 3.5–5.1)
Sodium: 134 mmol/L — ABNORMAL LOW (ref 135–145)

## 2018-07-29 LAB — GLUCOSE, CAPILLARY: Glucose-Capillary: 126 mg/dL — ABNORMAL HIGH (ref 70–99)

## 2018-07-29 LAB — CBC WITH DIFFERENTIAL/PLATELET
Abs Immature Granulocytes: 0.03 10*3/uL (ref 0.00–0.07)
BASOS PCT: 1 %
Basophils Absolute: 0.1 10*3/uL (ref 0.0–0.1)
EOS ABS: 0.2 10*3/uL (ref 0.0–0.5)
EOS PCT: 2 %
HCT: 22.7 % — ABNORMAL LOW (ref 36.0–46.0)
Hemoglobin: 6.7 g/dL — CL (ref 12.0–15.0)
Immature Granulocytes: 0 %
Lymphocytes Relative: 9 %
Lymphs Abs: 0.8 10*3/uL (ref 0.7–4.0)
MCH: 28.8 pg (ref 26.0–34.0)
MCHC: 29.5 g/dL — ABNORMAL LOW (ref 30.0–36.0)
MCV: 97.4 fL (ref 80.0–100.0)
MONO ABS: 0.7 10*3/uL (ref 0.1–1.0)
Monocytes Relative: 9 %
NEUTROS ABS: 6.5 10*3/uL (ref 1.7–7.7)
Neutrophils Relative %: 79 %
PLATELETS: 166 10*3/uL (ref 150–400)
RBC: 2.33 MIL/uL — AB (ref 3.87–5.11)
RDW: 19.6 % — AB (ref 11.5–15.5)
WBC: 8.2 10*3/uL (ref 4.0–10.5)
nRBC: 0 % (ref 0.0–0.2)

## 2018-07-29 MED ORDER — DARBEPOETIN ALFA 60 MCG/0.3ML IJ SOSY: 60.0000 ug | PREFILLED_SYRINGE | INTRAMUSCULAR | Status: DC

## 2018-07-29 MED ORDER — SODIUM CHLORIDE 0.9% IV SOLUTION
Freq: Once | INTRAVENOUS | Status: AC
  Administered 2018-07-29: 10:00:00 via INTRAVENOUS

## 2018-07-29 MED ORDER — DARBEPOETIN ALFA 60 MCG/0.3ML IJ SOSY
60.0000 ug | PREFILLED_SYRINGE | Freq: Once | INTRAMUSCULAR | Status: AC
  Administered 2018-07-29: 60 ug via SUBCUTANEOUS
  Filled 2018-07-29: qty 0.3

## 2018-07-29 NOTE — Progress Notes (Signed)
Patient ID: [name], female   DOB: 1950-09-23, 67 y.o.   MRN: [id number] [facility] Progress Note   Assessment/ Plan:   1.  Acute kidney injury on chronic kidney disease stage IV-suspected progression to ESRD: Started on hemodialysis because of worsening asterixis, fixed urine output/unchanged BUN/creatinine with fluids.  She underwent hemodialysis yesterday and continues to show improving mentation-I plan to follow her labs for the next 48 hours before deciding whether she is committed to chronic hemodialysis or whether she would be able to go home off of dialysis with close monitoring. 2.  Altered level of consciousness: This appears to be primarily from polypharmacy with multiple agents likely contributing to her recent somnolence/decreased responsiveness.  Improved with hemodialysis/holding sedating medications. 3.  Hypertension: Mildly elevated blood pressure. 4.  Hypokalemia: Secondary to diuretic induced losses with decreased oral intake, monitor with hemodialysis. 5.  Anemia: Restarted Aranesp yesterday-hemoglobin lower, agree with PRBC transfusion. 6.  Chronic protein calorie malnutrition/deconditioning: Secondary to chronic pancreatitis and recent prolonged hospitalization-agree with continued nutritional supplements and reevaluation by PT/OT for outpatient needs.  Subjective:   She reports to be feeling better this morning and inquires about disposition.   Objective:   BP (!) 159/49 (BP Location: Right Arm)   Pulse (!) 59   Temp 98.4 F (36.9 C) (Oral)   Resp 18   Ht 5\' 6"  (1.676 m)   Wt 57.1 kg   SpO2 96%   BMI 20.30 kg/m   Intake/Output Summary (Last 24 hours) at 07/29/2018 0919 Last data filed at 07/29/2018 0900 Gross per 24 hour  Intake 1005 ml  Output 650 ml  Net 355 ml   Weight change: -0.6 kg  Physical Exam: Gen: Sitting up in bed eating breakfast. CVS: Pulse regular rhythm, normal rate, S1 and S2 normal Resp: Decreased breath sounds over  bases bilaterally.  No rales Abd: Soft, flat, nontender Ext: Trace bilateral pretibial edema.  No asterixis.  Left BCF with fair thrill/hematoma from infiltration.  Imaging: No results found. Labs: BMET Recent Labs  Lab 07/24/18 1827 07/25/18 0420 07/25/18 1705 07/26/18 0403 07/27/18 0524 07/28/18 0453 07/29/18 0628  NA 137 140 139 139 142 138 134*  K 2.7* 3.1* 3.3* 3.1* 3.6 3.5 3.4*  CL 100 105 107 108 112* 107 101  CO2 23 20* 18* 20* 20* 21* 26  GLUCOSE 153* 112* 116* 133* 120* 157* 116*  BUN 84* 79* 72* 70* 73* 47* 31*  CREATININE 3.90* 3.73* 3.97* 3.80* 3.84* 2.90* 2.17*  CALCIUM 8.6* 8.5* 8.6* 8.4* 8.9 8.3* 7.8*  PHOS  --   --   --  5.6* 5.7*  --  3.0   CBC Recent Labs  Lab 07/26/18 0403 07/27/18 0524 07/28/18 0453 07/29/18 0628  WBC 8.5 8.3 11.8* 8.2  NEUTROABS 6.8  --   --  6.5  HGB 7.2* 7.3* 7.3* 6.7*  HCT 23.6* 25.2* 24.6* 22.7*  MCV 96.3 98.8 98.4 97.4  PLT 282 262 204 166    Medications:    . sodium chloride   Intravenous Once  . allopurinol  100 mg Oral Daily  . amLODipine  10 mg Oral Daily  . atorvastatin  40 mg Oral q1800  . carvedilol  12.5 mg Oral BID WC  . Chlorhexidine Gluconate Cloth  6 each Topical Q0600  . clopidogrel  75 mg Oral Q breakfast  . darbepoetin (ARANESP) injection - DIALYSIS  60 mcg Intravenous Q Fri-HD  . feeding supplement  1 Container Oral TID BM  .  feeding supplement (PRO-STAT SUGAR FREE 64)  30 mL Oral TID  . gabapentin  300 mg Oral Daily  . heparin  5,000 Units Subcutaneous Q8H  . hydrALAZINE  25 mg Oral Q8H  . mometasone-formoterol  2 puff Inhalation BID  . multivitamin  1 tablet Oral QHS  . pantoprazole  40 mg Oral BID  . sodium chloride flush  3 mL Intravenous Q12H   Elmarie Shiley, MD 07/29/2018, 9:19 AM

## 2018-07-29 NOTE — Progress Notes (Signed)
PROGRESS NOTE  [name] GMW:[id number] DOB: 1951/08/19 DOA: 07/24/2018 PCP: Bernerd Limbo, MD   LOS: 3 days   Brief Narrative / Interim history: 67 year old female with history of abdominal aortic aneurysm status post vascular repair, chronic kidney disease stage IV, CAD, recent admission to the hospital for acute pancreatitis with pseudocyst, diastolic CHF, chronic pain syndrome who was brought to the hospital 3 to 4 days after last discharge due to persistent somnolence.  She was found to have acute kidney injury and chronic kidney disease, she was admitted to the hospital and nephrology was consulted.  Subjective: Patient denies any new complaints, resting comfortably   Assessment & Plan: Principal Problem:   Acute encephalopathy Active Problems:   Essential hypertension   Anemia   Acute renal failure superimposed on stage 4 chronic kidney disease (HCC)   CAD (coronary artery disease)   Chronic diastolic congestive heart failure (HCC)   Hypokalemia   Chronic pain   AKI (acute kidney injury) ([place])   Goals of care, counseling/discussion   DNR (do not resuscitate) discussion   Acute metabolic encephalopathy likely secondary to polypharmacy with pain meds with concomitant poor renal function Improving -Patient was started on narcotics during her prior hospital stay, she tolerated them well initially, however when she got home she became progressively more lethargic -No signs of any infection -Continue to hold benzodiazepines as well as narcotics  Acute kidney injury on chronic kidney disease stage IV with possible progression to ESRD -Nephrology on board, started hemodialysis on 07/27/2018 -May need it for short time but also may require long-term hemodialysis -HD per nephrology  Anemia of chronic kidney disease -Hemoglobin dropped to 6.7 no active bleeding, continue B12 and iron -Nephrology restarting Aranesp with HD Transfuse 2U of PRBC on 07/29/18  Chronic pain  syndrome -Hold pain medications, hold clonazepam and Cymbalta  Acute on chronic diastolic CHF with acute hypoxic respiratory failure Stable Echo done on 05/2018 showed EF of 60 to 44%, grade 2 diastolic dysfunction, PA peak pressure of 36 mmHg Continue hemodialysis to assist in volume overload  Coronary artery disease Chest pain-free -Most recent DES in 2016, history of CABG as well -Continue Plavix, statin, beta-blocker  Hypertension -Blood pressure stable, continue current regimen  Status post recent hospitalization for acute pancreatitis with peripancreatic fluid collection -Stable, no nausea vomiting or abdominal pain currently  Abdominal aortic aneurysm /PAD -Status post repair with stent to the common iliac artery -Continue Plavix    Scheduled Meds: . allopurinol  100 mg Oral Daily  . amLODipine  10 mg Oral Daily  . atorvastatin  40 mg Oral q1800  . carvedilol  12.5 mg Oral BID WC  . Chlorhexidine Gluconate Cloth  6 each Topical Q0600  . clopidogrel  75 mg Oral Q breakfast  . [START ON 08/05/2018] darbepoetin (ARANESP) injection - DIALYSIS  60 mcg Intravenous Q Sat-HD  . feeding supplement  1 Container Oral TID BM  . feeding supplement (PRO-STAT SUGAR FREE 64)  30 mL Oral TID  . gabapentin  300 mg Oral Daily  . heparin  5,000 Units Subcutaneous Q8H  . hydrALAZINE  25 mg Oral Q8H  . mometasone-formoterol  2 puff Inhalation BID  . multivitamin  1 tablet Oral QHS  . pantoprazole  40 mg Oral BID  . sodium chloride flush  3 mL Intravenous Q12H   Continuous Infusions:  PRN Meds:.acetaminophen **OR** acetaminophen, naLOXone (NARCAN)  injection, ondansetron **OR** ondansetron (ZOFRAN) IV  DVT prophylaxis: heparin Code Status: Full Family Communication:  None at bedside Disposition Plan: TBD  Consultants:   Nephrology   Procedures:   None  Antimicrobials:  None    Objective: Vitals:   07/29/18 1315 07/29/18 1339 07/29/18 1358 07/29/18 1628  BP: (!)  150/48 (!) 150/48 (!) 157/48 (!) 141/58  Pulse: 61 62 62 (!) 57  Resp: 16 16 16 16   Temp: 98.3 F (36.8 C) 98.3 F (36.8 C) 98.2 F (36.8 C) 98.4 F (36.9 C)  TempSrc: Oral Oral Oral Oral  SpO2: 99% 98% 99% 99%  Weight:      Height:        Intake/Output Summary (Last 24 hours) at 07/29/2018 1730 Last data filed at 07/29/2018 1315 Gross per 24 hour  Intake 1842 ml  Output 0 ml  Net 1842 ml   Filed Weights   07/28/18 1244 07/28/18 1604 07/28/18 2158  Weight: 59 kg 58.6 kg 57.1 kg    Examination:  General: NAD   Cardiovascular: S1, S2 present  Respiratory: Bibasilar crackles noted  Abdomen: Soft, nontender, nondistended, bowel sounds present  Musculoskeletal: 1+ bilateral pedal edema noted  Skin: Normal  Psychiatry: Normal mood   Data Reviewed: I have independently reviewed following labs and imaging studies   CBC: Recent Labs  Lab 07/24/18 1827 07/25/18 0420 07/26/18 0403 07/27/18 0524 07/28/18 0453 07/29/18 0628  WBC 8.7  --  8.5 8.3 11.8* 8.2  NEUTROABS  --   --  6.8  --   --  6.5  HGB 7.9*  --  7.2* 7.3* 7.3* 6.7*  HCT 26.4* 22.7* 23.6* 25.2* 24.6* 22.7*  MCV 97.4  --  96.3 98.8 98.4 97.4  PLT 337  --  282 262 204 956   Basic Metabolic Panel: Recent Labs  Lab 07/24/18 1954  07/25/18 1705 07/26/18 0403 07/27/18 0524 07/28/18 0453 07/29/18 0628  NA  --    < > 139 139 142 138 134*  K  --    < > 3.3* 3.1* 3.6 3.5 3.4*  CL  --    < > 107 108 112* 107 101  CO2  --    < > 18* 20* 20* 21* 26  GLUCOSE  --    < > 116* 133* 120* 157* 116*  BUN  --    < > 72* 70* 73* 47* 31*  CREATININE  --    < > 3.97* 3.80* 3.84* 2.90* 2.17*  CALCIUM  --    < > 8.6* 8.4* 8.9 8.3* 7.8*  MG 1.9  --   --   --   --   --   --   PHOS  --   --   --  5.6* 5.7*  --  3.0   < > = values in this interval not displayed.   GFR: Estimated Creatinine Clearance: 23.3 mL/min (A) (by C-G formula based on SCr of 2.17 mg/dL (H)). Liver Function Tests: Recent Labs  Lab  07/24/18 1827 07/26/18 0403 07/27/18 0524 07/29/18 0628  AST 15  --   --   --   ALT 8  --   --   --   ALKPHOS 51  --   --   --   BILITOT 0.8  --   --   --   PROT 6.1*  --   --   --   ALBUMIN 3.0* 2.4* 2.3* 2.2*   No results for input(s): LIPASE, AMYLASE in the last 168 hours. Recent Labs  Lab 07/24/18 1954 07/25/18 0420  AMMONIA 41* 19  Coagulation Profile: No results for input(s): INR, PROTIME in the last 168 hours. Cardiac Enzymes: No results for input(s): CKTOTAL, CKMB, CKMBINDEX, TROPONINI in the last 168 hours. BNP (last 3 results) No results for input(s): PROBNP in the last 8760 hours. HbA1C: No results for input(s): HGBA1C in the last 72 hours. CBG: Recent Labs  Lab 07/26/18 1158  GLUCAP 145*   Lipid Profile: No results for input(s): CHOL, HDL, LDLCALC, TRIG, CHOLHDL, LDLDIRECT in the last 72 hours. Thyroid Function Tests: No results for input(s): TSH, T4TOTAL, FREET4, T3FREE, THYROIDAB in the last 72 hours. Anemia Panel: No results for input(s): VITAMINB12, FOLATE, FERRITIN, TIBC, IRON, RETICCTPCT in the last 72 hours. Urine analysis:    Component Value Date/Time   COLORURINE YELLOW 07/25/2018 0530   APPEARANCEUR CLEAR 07/25/2018 0530   LABSPEC 1.008 07/25/2018 0530   PHURINE 5.0 07/25/2018 0530   GLUCOSEU NEGATIVE 07/25/2018 0530   HGBUR NEGATIVE 07/25/2018 0530   BILIRUBINUR NEGATIVE 07/25/2018 0530   KETONESUR NEGATIVE 07/25/2018 0530   PROTEINUR NEGATIVE 07/25/2018 0530   UROBILINOGEN 0.2 03/04/2011 2107   NITRITE NEGATIVE 07/25/2018 0530   LEUKOCYTESUR TRACE (A) 07/25/2018 0530   Sepsis Labs: Invalid input(s): PROCALCITONIN, LACTICIDVEN  No results found for this or any previous visit (from the past 240 hour(s)).    Radiology Studies: Ct Head Wo Contrast  Result Date: 07/24/2018 CLINICAL DATA:  Altered mental status, somnolent. History of anemia, pancreatitis. EXAM: CT HEAD WITHOUT CONTRAST TECHNIQUE: Contiguous axial images were obtained  from the base of the skull through the vertex without intravenous contrast. COMPARISON:  CT HEAD June 30, 2018 FINDINGS: BRAIN: No intraparenchymal hemorrhage, mass effect nor midline shift. Mild parenchymal brain volume loss. Patchy supratentorial white matter hypodensities within normal range for patient's age, though non-specific are most compatible with chronic small vessel ischemic disease. No acute large vascular territory infarcts. No abnormal extra-axial fluid collections. Basal cisterns are patent. VASCULAR: Severe calcific atherosclerosis of the carotid siphons. SKULL: No skull fracture. No significant scalp soft tissue swelling. SINUSES/ORBITS: Trace paranasal sinus mucosal thickening. Mastoid air cells are well aerated.The included ocular globes and orbital contents are non-suspicious. OTHER: None. IMPRESSION: 1. No acute intracranial process. 2. Mild parenchymal brain volume loss. 3. Mild chronic small vessel ischemic changes. Severe intracranial atherosclerosis. Electronically Signed   By: Elon Alas M.D.   On: 07/24/2018 20:33   Dg Chest Portable 1 View  Result Date: 07/24/2018 CLINICAL DATA:  Altered level of consciousness. EXAM: PORTABLE CHEST 1 VIEW COMPARISON:  07/08/2018 FINDINGS: Previous median sternotomy. Aortic atherosclerosis. Mild cardiac enlargement. Moderate diffuse pulmonary edema is similar to previous exam. No focal airspace opacities. IMPRESSION: 1. Cardiac enlargement and pulmonary edema. 2.  Aortic Atherosclerosis (ICD10-I70.0). Electronically Signed   By: Kerby Moors M.D.   On: 07/24/2018 20:38     [facility] Friendly [facility]  If 7PM-7AM, please contact night-coverage [email or web address] 07/29/2018, 5:30 PM

## 2018-07-30 ENCOUNTER — Inpatient Hospital Stay (HOSPITAL_COMMUNITY): Payer: Medicare Other

## 2018-07-30 LAB — BPAM RBC
Blood Product Expiration Date: 201911292359
Blood Product Expiration Date: 201911292359
ISSUE DATE / TIME: 201911231009
ISSUE DATE / TIME: 201911231333
UNIT TYPE AND RH: 7300
Unit Type and Rh: 7300

## 2018-07-30 LAB — RENAL FUNCTION PANEL
Albumin: 2.3 g/dL — ABNORMAL LOW (ref 3.5–5.0)
Anion gap: 9 (ref 5–15)
BUN: 41 mg/dL — AB (ref 8–23)
CALCIUM: 8.2 mg/dL — AB (ref 8.9–10.3)
CO2: 24 mmol/L (ref 22–32)
Chloride: 101 mmol/L (ref 98–111)
Creatinine, Ser: 2.35 mg/dL — ABNORMAL HIGH (ref 0.44–1.00)
GFR, EST AFRICAN AMERICAN: 24 mL/min — AB (ref >60)
GFR, EST NON AFRICAN AMERICAN: 21 mL/min — AB (ref >60)
Glucose, Bld: 101 mg/dL — ABNORMAL HIGH (ref 70–99)
Phosphorus: 4.2 mg/dL (ref 2.5–4.6)
Potassium: 3.1 mmol/L — ABNORMAL LOW (ref 3.5–5.1)
SODIUM: 134 mmol/L — AB (ref 135–145)

## 2018-07-30 LAB — CBC WITH DIFFERENTIAL/PLATELET
Abs Immature Granulocytes: 0.03 10*3/uL (ref 0.00–0.07)
BASOS ABS: 0.1 10*3/uL (ref 0.0–0.1)
Basophils Relative: 1 %
EOS ABS: 0.2 10*3/uL (ref 0.0–0.5)
Eosinophils Relative: 2 %
HCT: 30 % — ABNORMAL LOW (ref 36.0–46.0)
HEMOGLOBIN: 9.2 g/dL — AB (ref 12.0–15.0)
Immature Granulocytes: 1 %
LYMPHS ABS: 0.8 10*3/uL (ref 0.7–4.0)
LYMPHS PCT: 12 %
MCH: 28 pg (ref 26.0–34.0)
MCHC: 30.7 g/dL (ref 30.0–36.0)
MCV: 91.2 fL (ref 80.0–100.0)
MONO ABS: 0.8 10*3/uL (ref 0.1–1.0)
Monocytes Relative: 12 %
NRBC: 0 % (ref 0.0–0.2)
Neutro Abs: 4.8 10*3/uL (ref 1.7–7.7)
Neutrophils Relative %: 72 %
PLATELETS: 194 10*3/uL (ref 150–400)
RBC: 3.29 MIL/uL — ABNORMAL LOW (ref 3.87–5.11)
RDW: 21 % — ABNORMAL HIGH (ref 11.5–15.5)
WBC: 6.6 10*3/uL (ref 4.0–10.5)

## 2018-07-30 LAB — TYPE AND SCREEN
ABO/RH(D): B POS
ANTIBODY SCREEN: NEGATIVE
Unit division: 0
Unit division: 0

## 2018-07-30 LAB — GLUCOSE, CAPILLARY
GLUCOSE-CAPILLARY: 87 mg/dL (ref 70–99)
GLUCOSE-CAPILLARY: 102 mg/dL — AB (ref 70–99)
Glucose-Capillary: 189 mg/dL — ABNORMAL HIGH (ref 70–99)

## 2018-07-30 MED ORDER — HYDRALAZINE HCL 50 MG PO TABS
50.0000 mg | ORAL_TABLET | Freq: Three times a day (TID) | ORAL | Status: DC
  Administered 2018-07-30 – 2018-08-01 (×7): 50 mg via ORAL
  Filled 2018-07-30 (×7): qty 1

## 2018-07-30 MED ORDER — POTASSIUM CHLORIDE CRYS ER 20 MEQ PO TBCR
40.0000 meq | EXTENDED_RELEASE_TABLET | Freq: Once | ORAL | Status: AC
  Administered 2018-07-30: 40 meq via ORAL
  Filled 2018-07-30: qty 2

## 2018-07-30 NOTE — Progress Notes (Signed)
PROGRESS NOTE  [name] AST:[id number] DOB: 1951-07-20 DOA: 07/24/2018 PCP: Bernerd Limbo, MD   LOS: 4 days   Brief Narrative / Interim history: 67 year old female with history of abdominal aortic aneurysm status post vascular repair, chronic kidney disease stage IV, CAD, recent admission to the hospital for acute pancreatitis with pseudocyst, diastolic CHF, chronic pain syndrome who was brought to the hospital 3 to 4 days after last discharge due to persistent somnolence.  She was found to have acute kidney injury and chronic kidney disease, she was admitted to the hospital and nephrology was consulted.  Subjective: Today, patient complained of left foot swelling and some mild tenderness, denies any trauma.  Denies any chest pain, shortness of breath, abdominal pain, nausea/vomiting, fever/chills.   Assessment & Plan: Principal Problem:   Acute encephalopathy Active Problems:   Essential hypertension   Anemia   Acute renal failure superimposed on stage 4 chronic kidney disease (HCC)   CAD (coronary artery disease)   Chronic diastolic congestive heart failure (HCC)   Hypokalemia   Chronic pain   AKI (acute kidney injury) ([place])   Goals of care, counseling/discussion   DNR (do not resuscitate) discussion   Acute metabolic encephalopathy likely secondary to polypharmacy with pain meds with concomitant poor renal function Resolved -Patient was started on narcotics during her prior hospital stay, she tolerated them well initially, however when she got home she became progressively more lethargic -No signs of any infection -Continue to hold benzodiazepines as well as narcotics  Acute kidney injury on chronic kidney disease stage IV with possible progression to ESRD -Nephrology on board, started hemodialysis on 07/27/2018 -May need it for short time but also may require long-term hemodialysis -HD per nephrology  Left foot swelling with mild tenderness Bilateral foot x-ray  ordered Uric acid in the a.m.  Anemia of chronic kidney disease -Hemoglobin dropped to 6.7 no active bleeding, continue B12 and iron -Nephrology restarting Aranesp with HD Transfused 2U of PRBC on 07/29/18  Chronic pain syndrome -Hold pain medications, hold clonazepam and Cymbalta  Acute on chronic diastolic CHF with acute hypoxic respiratory failure Stable Echo done on 05/2018 showed EF of 60 to 98%, grade 2 diastolic dysfunction, PA peak pressure of 36 mmHg Continue hemodialysis to assist in volume overload  Coronary artery disease Chest pain-free -Most recent DES in 2016, history of CABG as well -Continue Plavix, statin, beta-blocker  Hypertension -Blood pressure stable, continue current regimen  Status post recent hospitalization for acute pancreatitis with peripancreatic fluid collection -Stable, no nausea vomiting or abdominal pain currently  Abdominal aortic aneurysm /PAD -Status post repair with stent to the common iliac artery -Continue Plavix    Scheduled Meds: . allopurinol  100 mg Oral Daily  . amLODipine  10 mg Oral Daily  . atorvastatin  40 mg Oral q1800  . carvedilol  12.5 mg Oral BID WC  . Chlorhexidine Gluconate Cloth  6 each Topical Q0600  . clopidogrel  75 mg Oral Q breakfast  . [START ON 08/05/2018] darbepoetin (ARANESP) injection - DIALYSIS  60 mcg Intravenous Q Sat-HD  . feeding supplement  1 Container Oral TID BM  . feeding supplement (PRO-STAT SUGAR FREE 64)  30 mL Oral TID  . gabapentin  300 mg Oral Daily  . heparin  5,000 Units Subcutaneous Q8H  . hydrALAZINE  50 mg Oral Q8H  . mometasone-formoterol  2 puff Inhalation BID  . multivitamin  1 tablet Oral QHS  . pantoprazole  40 mg Oral BID  .  sodium chloride flush  3 mL Intravenous Q12H   Continuous Infusions:  PRN Meds:.acetaminophen **OR** acetaminophen, naLOXone (NARCAN)  injection, ondansetron **OR** ondansetron (ZOFRAN) IV  DVT prophylaxis: heparin Code Status: Full Family  Communication: None at bedside Disposition Plan: TBD  Consultants:   Nephrology   Procedures:   None  Antimicrobials:  None    Objective: Vitals:   07/30/18 0500 07/30/18 0512 07/30/18 0821 07/30/18 1037  BP:  (!) 150/43 (!) 160/62   Pulse:  (!) 55 60 (!) 59  Resp:  19 18 16   Temp:  99.3 F (37.4 C) 98.7 F (37.1 C)   TempSrc:  Oral Oral   SpO2:  93% 99% 99%  Weight: 57.3 kg     Height:        Intake/Output Summary (Last 24 hours) at 07/30/2018 1731 Last data filed at 07/30/2018 1644 Gross per 24 hour  Intake 900 ml  Output 750 ml  Net 150 ml   Filed Weights   07/28/18 2158 07/29/18 2151 07/30/18 0500  Weight: 57.1 kg 57.3 kg 57.3 kg    Examination:  General: NAD   Cardiovascular: S1, S2 present  Respiratory: Bibasilar crackles noted  Abdomen: Soft, nontender, nondistended, bowel sounds present  Musculoskeletal: 1+ bilateral pedal edema noted, worse on the left  Skin: Normal  Psychiatry:  Normal mood   Data Reviewed: I have independently reviewed following labs and imaging studies   CBC: Recent Labs  Lab 07/26/18 0403 07/27/18 0524 07/28/18 0453 07/29/18 0628 07/29/18 2135 07/30/18 0551  WBC 8.5 8.3 11.8* 8.2 6.8 6.6  NEUTROABS 6.8  --   --  6.5  --  4.8  HGB 7.2* 7.3* 7.3* 6.7* 9.4* 9.2*  HCT 23.6* 25.2* 24.6* 22.7* 29.5* 30.0*  MCV 96.3 98.8 98.4 97.4 89.7 91.2  PLT 282 262 204 166 177 220   Basic Metabolic Panel: Recent Labs  Lab 07/24/18 1954  07/26/18 0403 07/27/18 0524 07/28/18 0453 07/29/18 0628 07/30/18 0551  NA  --    < > 139 142 138 134* 134*  K  --    < > 3.1* 3.6 3.5 3.4* 3.1*  CL  --    < > 108 112* 107 101 101  CO2  --    < > 20* 20* 21* 26 24  GLUCOSE  --    < > 133* 120* 157* 116* 101*  BUN  --    < > 70* 73* 47* 31* 41*  CREATININE  --    < > 3.80* 3.84* 2.90* 2.17* 2.35*  CALCIUM  --    < > 8.4* 8.9 8.3* 7.8* 8.2*  MG 1.9  --   --   --   --   --   --   PHOS  --   --  5.6* 5.7*  --  3.0 4.2   < > = values  in this interval not displayed.   GFR: Estimated Creatinine Clearance: 21.6 mL/min (A) (by C-G formula based on SCr of 2.35 mg/dL (H)). Liver Function Tests: Recent Labs  Lab 07/24/18 1827 07/26/18 0403 07/27/18 0524 07/29/18 0628 07/30/18 0551  AST 15  --   --   --   --   ALT 8  --   --   --   --   ALKPHOS 51  --   --   --   --   BILITOT 0.8  --   --   --   --   PROT 6.1*  --   --   --   --  ALBUMIN 3.0* 2.4* 2.3* 2.2* 2.3*   No results for input(s): LIPASE, AMYLASE in the last 168 hours. Recent Labs  Lab 07/24/18 1954 07/25/18 0420  AMMONIA 41* 19   Coagulation Profile: No results for input(s): INR, PROTIME in the last 168 hours. Cardiac Enzymes: No results for input(s): CKTOTAL, CKMB, CKMBINDEX, TROPONINI in the last 168 hours. BNP (last 3 results) No results for input(s): PROBNP in the last 8760 hours. HbA1C: No results for input(s): HGBA1C in the last 72 hours. CBG: Recent Labs  Lab 07/26/18 1158 07/29/18 2153 07/30/18 0822 07/30/18 1144 07/30/18 1650  GLUCAP 145* 126* 87 189* 102*   Lipid Profile: No results for input(s): CHOL, HDL, LDLCALC, TRIG, CHOLHDL, LDLDIRECT in the last 72 hours. Thyroid Function Tests: No results for input(s): TSH, T4TOTAL, FREET4, T3FREE, THYROIDAB in the last 72 hours. Anemia Panel: No results for input(s): VITAMINB12, FOLATE, FERRITIN, TIBC, IRON, RETICCTPCT in the last 72 hours. Urine analysis:    Component Value Date/Time   COLORURINE YELLOW 07/25/2018 0530   APPEARANCEUR CLEAR 07/25/2018 0530   LABSPEC 1.008 07/25/2018 0530   PHURINE 5.0 07/25/2018 0530   GLUCOSEU NEGATIVE 07/25/2018 0530   HGBUR NEGATIVE 07/25/2018 0530   BILIRUBINUR NEGATIVE 07/25/2018 0530   KETONESUR NEGATIVE 07/25/2018 0530   PROTEINUR NEGATIVE 07/25/2018 0530   UROBILINOGEN 0.2 03/04/2011 2107   NITRITE NEGATIVE 07/25/2018 0530   LEUKOCYTESUR TRACE (A) 07/25/2018 0530   Sepsis Labs: Invalid input(s): PROCALCITONIN, LACTICIDVEN  No  results found for this or any previous visit (from the past 240 hour(s)).    Radiology Studies: Ct Head Wo Contrast  Result Date: 07/24/2018 CLINICAL DATA:  Altered mental status, somnolent. History of anemia, pancreatitis. EXAM: CT HEAD WITHOUT CONTRAST TECHNIQUE: Contiguous axial images were obtained from the base of the skull through the vertex without intravenous contrast. COMPARISON:  CT HEAD June 30, 2018 FINDINGS: BRAIN: No intraparenchymal hemorrhage, mass effect nor midline shift. Mild parenchymal brain volume loss. Patchy supratentorial white matter hypodensities within normal range for patient's age, though non-specific are most compatible with chronic small vessel ischemic disease. No acute large vascular territory infarcts. No abnormal extra-axial fluid collections. Basal cisterns are patent. VASCULAR: Severe calcific atherosclerosis of the carotid siphons. SKULL: No skull fracture. No significant scalp soft tissue swelling. SINUSES/ORBITS: Trace paranasal sinus mucosal thickening. Mastoid air cells are well aerated.The included ocular globes and orbital contents are non-suspicious. OTHER: None. IMPRESSION: 1. No acute intracranial process. 2. Mild parenchymal brain volume loss. 3. Mild chronic small vessel ischemic changes. Severe intracranial atherosclerosis. Electronically Signed   By: Elon Alas M.D.   On: 07/24/2018 20:33   Dg Chest Portable 1 View  Result Date: 07/24/2018 CLINICAL DATA:  Altered level of consciousness. EXAM: PORTABLE CHEST 1 VIEW COMPARISON:  07/08/2018 FINDINGS: Previous median sternotomy. Aortic atherosclerosis. Mild cardiac enlargement. Moderate diffuse pulmonary edema is similar to previous exam. No focal airspace opacities. IMPRESSION: 1. Cardiac enlargement and pulmonary edema. 2.  Aortic Atherosclerosis (ICD10-I70.0). Electronically Signed   By: Kerby Moors M.D.   On: 07/24/2018 20:38     [facility] Friend[facility]  If 7PM-7AM,  please contact night-coverage [email or web address] 07/30/2018, 5:31 PM

## 2018-07-30 NOTE — Plan of Care (Signed)
  Problem: Education: Goal: Knowledge of General Education information will improve Description: Including pain rating scale, medication(s)/side effects and non-pharmacologic comfort measures Outcome: Progressing   Problem: Health Behavior/Discharge Planning: Goal: Ability to manage health-related needs will improve Outcome: Progressing   Problem: Activity: Goal: Risk for activity intolerance will decrease Outcome: Progressing   Problem: Nutrition: Goal: Adequate nutrition will be maintained Outcome: Progressing   Problem: Elimination: Goal: Will not experience complications related to bowel motility Outcome: Progressing Goal: Will not experience complications related to urinary retention Outcome: Progressing   Problem: Pain Managment: Goal: General experience of comfort will improve Outcome: Progressing   Problem: Safety: Goal: Ability to remain free from injury will improve Outcome: Progressing   Problem: Skin Integrity: Goal: Risk for impaired skin integrity will decrease Outcome: Progressing   

## 2018-07-30 NOTE — Progress Notes (Signed)
Patient ID: [name], female   DOB: 09/08/1950, 67 y.o.   MRN: [id number] [facility] Progress Note   Assessment/ Plan:   1.  Acute kidney injury on chronic kidney disease stage IV-suspected progression to ESRD: Started on hemodialysis because of worsening asterixis, fixed urine output/unchanged BUN/creatinine with fluids.  She had 2 dialysis treatments and I am following her expectantly to see if she requires chronic hemodialysis or if may be able to temporarily remain off of this.  The process has been started for outpatient dialysis unit placement to [place]. 2.  Altered level of consciousness: This appears to be primarily from polypharmacy with multiple agents likely contributing to her recent somnolence/decreased responsiveness.  Improved with hemodialysis/holding sedating medications. 3.  Hypertension: Blood pressure elevated-increase hydralazine to 50 mg 3 times daily. 4.  Hypokalemia: Secondary to diuretic induced losses with decreased oral intake/hemodialysis-we will give supplement. 5.  Anemia: Restarted Aranesp yesterday-hemoglobin lower, agree with PRBC transfusion. 6.  Chronic protein calorie malnutrition/deconditioning: Secondary to chronic pancreatitis and recent prolonged hospitalization-agree with continued nutritional supplements and reevaluation by PT/OT for outpatient needs.  Subjective:   She reports to be feeling better this morning and remains apprehensive about chronic hemodialysis.   Objective:   BP (!) 160/62 (BP Location: Right Arm)   Pulse (!) 59   Temp 98.7 F (37.1 C) (Oral)   Resp 16   Ht 5\' 6"  (1.676 m)   Wt 57.3 kg   SpO2 99%   BMI 20.39 kg/m   Intake/Output Summary (Last 24 hours) at 07/30/2018 1045 Last data filed at 07/30/2018 0200 Gross per 24 hour  Intake 1235 ml  Output -  Net 1235 ml   Weight change: -1.7 kg  Physical Exam: Gen: Comfortably in bed. CVS: Pulse regular rhythm, normal rate, S1 and S2 normal Resp: Decreased  breath sounds over bases bilaterally.  No rales Abd: Soft, flat, nontender Ext: Trace bilateral pretibial edema.  No asterixis.  Left BCF with fair thrill and hematoma from infiltration.  Imaging: No results found. Labs: BMET Recent Labs  Lab 07/25/18 0420 07/25/18 1705 07/26/18 0403 07/27/18 0524 07/28/18 0453 07/29/18 0628 07/30/18 0551  NA 140 139 139 142 138 134* 134*  K 3.1* 3.3* 3.1* 3.6 3.5 3.4* 3.1*  CL 105 107 108 112* 107 101 101  CO2 20* 18* 20* 20* 21* 26 24  GLUCOSE 112* 116* 133* 120* 157* 116* 101*  BUN 79* 72* 70* 73* 47* 31* 41*  CREATININE 3.73* 3.97* 3.80* 3.84* 2.90* 2.17* 2.35*  CALCIUM 8.5* 8.6* 8.4* 8.9 8.3* 7.8* 8.2*  PHOS  --   --  5.6* 5.7*  --  3.0 4.2   CBC Recent Labs  Lab 07/26/18 0403  07/28/18 0453 07/29/18 0628 07/29/18 2135 07/30/18 0551  WBC 8.5   < > 11.8* 8.2 6.8 6.6  NEUTROABS 6.8  --   --  6.5  --  4.8  HGB 7.2*   < > 7.3* 6.7* 9.4* 9.2*  HCT 23.6*   < > 24.6* 22.7* 29.5* 30.0*  MCV 96.3   < > 98.4 97.4 89.7 91.2  PLT 282   < > 204 166 177 194   < > = values in this interval not displayed.    Medications:    . allopurinol  100 mg Oral Daily  . amLODipine  10 mg Oral Daily  . atorvastatin  40 mg Oral q1800  . carvedilol  12.5 mg Oral BID WC  . Chlorhexidine Gluconate Cloth  6 each Topical Q0600  . clopidogrel  75 mg Oral Q breakfast  . [START ON 08/05/2018] darbepoetin (ARANESP) injection - DIALYSIS  60 mcg Intravenous Q Sat-HD  . feeding supplement  1 Container Oral TID BM  . feeding supplement (PRO-STAT SUGAR FREE 64)  30 mL Oral TID  . gabapentin  300 mg Oral Daily  . heparin  5,000 Units Subcutaneous Q8H  . hydrALAZINE  25 mg Oral Q8H  . mometasone-formoterol  2 puff Inhalation BID  . multivitamin  1 tablet Oral QHS  . pantoprazole  40 mg Oral BID  . sodium chloride flush  3 mL Intravenous Q12H   Elmarie Shiley, MD 07/30/2018, 10:45 AM

## 2018-07-31 LAB — CBC WITH DIFFERENTIAL/PLATELET
Abs Immature Granulocytes: 0.05 10*3/uL (ref 0.00–0.07)
Basophils Absolute: 0 10*3/uL (ref 0.0–0.1)
Basophils Relative: 1 %
EOS ABS: 0.2 10*3/uL (ref 0.0–0.5)
EOS PCT: 2 %
HEMATOCRIT: 31.2 % — AB (ref 36.0–46.0)
Hemoglobin: 9.5 g/dL — ABNORMAL LOW (ref 12.0–15.0)
Immature Granulocytes: 1 %
LYMPHS ABS: 0.7 10*3/uL (ref 0.7–4.0)
Lymphocytes Relative: 9 %
MCH: 27.7 pg (ref 26.0–34.0)
MCHC: 30.4 g/dL (ref 30.0–36.0)
MCV: 91 fL (ref 80.0–100.0)
MONO ABS: 0.8 10*3/uL (ref 0.1–1.0)
MONOS PCT: 10 %
Neutro Abs: 5.7 10*3/uL (ref 1.7–7.7)
Neutrophils Relative %: 77 %
Platelets: 220 10*3/uL (ref 150–400)
RBC: 3.43 MIL/uL — ABNORMAL LOW (ref 3.87–5.11)
RDW: 19.9 % — AB (ref 11.5–15.5)
WBC: 7.4 10*3/uL (ref 4.0–10.5)
nRBC: 0 % (ref 0.0–0.2)

## 2018-07-31 LAB — GLUCOSE, CAPILLARY
GLUCOSE-CAPILLARY: 209 mg/dL — AB (ref 70–99)
GLUCOSE-CAPILLARY: 132 mg/dL — AB (ref 70–99)
GLUCOSE-CAPILLARY: 152 mg/dL — AB (ref 70–99)
Glucose-Capillary: 104 mg/dL — ABNORMAL HIGH (ref 70–99)

## 2018-07-31 LAB — RENAL FUNCTION PANEL
ALBUMIN: 2.4 g/dL — AB (ref 3.5–5.0)
Anion gap: 9 (ref 5–15)
BUN: 48 mg/dL — AB (ref 8–23)
CALCIUM: 8.2 mg/dL — AB (ref 8.9–10.3)
CO2: 23 mmol/L (ref 22–32)
CREATININE: 2.72 mg/dL — AB (ref 0.44–1.00)
Chloride: 101 mmol/L (ref 98–111)
GFR calc Af Amer: 20 mL/min — ABNORMAL LOW (ref >60)
GFR calc non Af Amer: 17 mL/min — ABNORMAL LOW (ref >60)
GLUCOSE: 137 mg/dL — AB (ref 70–99)
PHOSPHORUS: 4.5 mg/dL (ref 2.5–4.6)
Potassium: 3.3 mmol/L — ABNORMAL LOW (ref 3.5–5.1)
SODIUM: 133 mmol/L — AB (ref 135–145)

## 2018-07-31 LAB — URIC ACID: Uric Acid, Serum: 5.1 mg/dL (ref 2.5–7.1)

## 2018-07-31 MED ORDER — TRAMADOL HCL 50 MG PO TABS
50.0000 mg | ORAL_TABLET | Freq: Two times a day (BID) | ORAL | Status: DC | PRN
  Administered 2018-07-31 – 2018-08-01 (×2): 50 mg via ORAL
  Filled 2018-07-31 (×2): qty 1

## 2018-07-31 NOTE — Progress Notes (Signed)
Orthopedic Tech Progress Note Patient Details:  [name] [name] Out[facility] Surgery June 20, 1951 [id number]  Ortho Devices Type of Ortho Device: Postop shoe/boot Ortho Device/Splint Interventions: Application   Post Interventions Patient Tolerated: Well Instructions Provided: Care of device   [place] Pink 07/31/2018, 6:40 PM

## 2018-07-31 NOTE — Progress Notes (Signed)
Occupational Therapy Treatment Patient Details Name: [name] MRN: [id number] DOB: 09-06-1951 Today's Date: 07/31/2018    History of present illness Pt is a 67 y.o. female admitted to [facility] 07/24/18 with lethargy and AMS; transferred to [facility]. Worked up for AKI on CKD IV. Altered level of consciousness attributed to likely polypharmacy. HD initiated 11/21. PMH includes HTN, CKD IV-V, neuropathy, CAD (s/p CABG), PVD, chronic back pain, COPD. Of note, recent admission from [facility] to [facility] 11/2-11/13 with RUQ pain.; x-ray L foot shows suspectected nondisplaced fracture of the distal metaphysis promximal phalanx small toe.    OT comments  Pt making steady progress. Able to ambulate to bathroom and complete pericare with S @ RW level. Pt reports L foot fracture (from x-ray 11/24). Pt did not complain of pain when ambulating to bathroom. Asked nsg to get clarification regarding need for use of cam boot for ambulation (pt states doctor said "something about using a boot"). Will continue to follow acutely.   Follow Up Recommendations  SNF    Equipment Recommendations  None recommended by OT    Recommendations for Other Services      Precautions / Restrictions Precautions Precautions: Fall       Mobility Bed Mobility               General bed mobility comments: OOB in chair  Transfers Overall transfer level: Needs assistance Equipment used: Rolling walker (2 wheeled) Transfers: Sit to/from Stand Sit to Stand: Supervision              Balance Overall balance assessment: Needs assistance   Sitting balance-Leahy Scale: Good       Standing balance-Leahy Scale: Fair Standing balance comment: able to complete pericare at sink with S                           ADL either performed or assessed with clinical judgement   ADL Overall ADL's : Needs assistance/impaired Eating/Feeding: Modified independent   Grooming: Set up;Supervision/safety;Standing   Upper  Body Bathing: Set up;Standing   Lower Body Bathing: Min guard;Sit to/from stand   Upper Body Dressing : Set up;Sitting   Lower Body Dressing: Min guard;Sit to/from stand   Toilet Transfer: Supervision/safety;Ambulation;RW   Toileting- Clothing Manipulation and Hygiene: Set up;Sitting/lateral lean       Functional mobility during ADLs: Supervision/safety;Rolling walker       Vision       Perception     Praxis      Cognition Arousal/Alertness: Awake/alert Behavior During Therapy: WFL for tasks assessed/performed Overall Cognitive Status: No family/caregiver present to determine baseline cognitive functioning                                 General Comments: Cognition appeasr significnatly imporved since evaluation        Exercises     Shoulder Instructions       General Comments      Pertinent Vitals/ Pain       Pain Assessment: Faces Faces Pain Scale: Hurts little more Pain Location: side Pain Descriptors / Indicators: Discomfort Pain Intervention(s): Limited activity within patient's tolerance  Home Living  Prior Functioning/Environment              Frequency  Min 2X/week        Progress Toward Goals  OT Goals(current goals can now be found in the care plan section)  Progress towards OT goals: Progressing toward goals  Acute Rehab OT Goals Patient Stated Goal: Get stronger at SNF before returning to daughter's home; "get a cigarette" OT Goal Formulation: With patient Time For Goal Achievement: 08/11/18 Potential to Achieve Goals: Good ADL Goals Pt Will Perform Grooming: standing;with supervision Pt Will Perform Upper Body Bathing: with set-up;sitting Pt Will Perform Lower Body Bathing: with supervision;sit to/from stand Pt Will Perform Upper Body Dressing: with set-up;sitting Pt Will Perform Lower Body Dressing: with supervision;sit to/from stand Pt Will Transfer  to Toilet: with supervision;ambulating;bedside commode Pt Will Perform Toileting - Clothing Manipulation and hygiene: with supervision;sit to/from stand Additional ADL Goal #1: pt will perform bed mobility at supervision level in preparation for adls Additional ADL Goal #2: pt will initiate at least one rest break without cues for energy conservation  Plan Discharge plan remains appropriate    Co-evaluation                 AM-PAC OT "6 Clicks" Daily Activity     Outcome Measure   Help from another person eating meals?: None Help from another person taking care of personal grooming?: A Little Help from another person toileting, which includes using toliet, bedpan, or urinal?: A Little Help from another person bathing (including washing, rinsing, drying)?: A Little Help from another person to put on and taking off regular upper body clothing?: None Help from another person to put on and taking off regular lower body clothing?: A Little 6 Click Score: 20    End of Session Equipment Utilized During Treatment: Rolling walker  OT Visit Diagnosis: Muscle weakness (generalized) (M62.81)   Activity Tolerance Patient tolerated treatment well   Patient Left in chair;with call bell/phone within reach;with chair alarm set   Nurse Communication Mobility status(status of L foot fracture)        Time: 1308-6578 OT Time Calculation (min): 19 min  Charges: OT General Charges $OT Visit: 1 Visit OT Treatments $Self Care/Home Management : 8-22 mins  Maurie Boettcher, OT/L   Acute OT Clinical Specialist Acute Rehabilitation Services Pager [phone] Office [phone]    [facility] 07/31/2018, 12:17 PM

## 2018-07-31 NOTE — Progress Notes (Signed)
Admit: 07/24/2018 LOS: 5  29F with progressive renal failure, probable ESRD  Subjective:  . > 1L UOP yesterday . SCr increased  To 2.72, K 3.3, HCO3 23, BUN 48 . PT w/o c/o . Have used AVF for HD x2 . Last Tx 11/22  11/24 0701 - 11/25 0700 In: 730 [P.O.:730] Out: 1050 [Urine:1050]  Filed Weights   07/29/18 2151 07/30/18 0500 07/30/18 2020  Weight: 57.3 kg 57.3 kg 58.1 kg    Scheduled Meds: . allopurinol  100 mg Oral Daily  . amLODipine  10 mg Oral Daily  . atorvastatin  40 mg Oral q1800  . carvedilol  12.5 mg Oral BID WC  . Chlorhexidine Gluconate Cloth  6 each Topical Q0600  . clopidogrel  75 mg Oral Q breakfast  . [START ON 08/05/2018] darbepoetin (ARANESP) injection - DIALYSIS  60 mcg Intravenous Q Sat-HD  . feeding supplement  1 Container Oral TID BM  . feeding supplement (PRO-STAT SUGAR FREE 64)  30 mL Oral TID  . gabapentin  300 mg Oral Daily  . heparin  5,000 Units Subcutaneous Q8H  . hydrALAZINE  50 mg Oral Q8H  . mometasone-formoterol  2 puff Inhalation BID  . multivitamin  1 tablet Oral QHS  . pantoprazole  40 mg Oral BID  . sodium chloride flush  3 mL Intravenous Q12H   Continuous Infusions: PRN Meds:.acetaminophen **OR** acetaminophen, naLOXone (NARCAN)  injection, ondansetron **OR** ondansetron (ZOFRAN) IV, traMADol  Current Labs: reviewed    Physical Exam:  Blood pressure (!) 155/48, pulse (!) 55, temperature 98.9 F (37.2 C), temperature source Oral, resp. rate 18, height 5\' 6"  (1.676 m), weight 58.1 kg, SpO2 97 %. NAD RRR CTAB LUE AVF +B/T L>R LEE, 3+ on L Nonfocal, aao x3  A 1. Advanced CKD4, likely ESRD 2. AMS, seems improved today 3. HTN, BPs stable for now 4. Hypokalemia, K 3.3, monitor will need higher K bath if HD resumed 5. Malnutrition, hx/o pancreatitis 6. ANemia, on ESA qSat; s/p tranfusion 11/23 during admit  P . Will see labs in AM< UOP, and how she is feeling prior to committing to HD . Daily weights, Daily Renal Panel,  Strict I/Os, Avoid nephrotoxins (NSAIDs, judicious IV Contrast)    Pearson Grippe MD 07/31/2018, 2:39 PM  Recent Labs  Lab 07/29/18 0628 07/30/18 0551 07/31/18 0447  NA 134* 134* 133*  K 3.4* 3.1* 3.3*  CL 101 101 101  CO2 26 24 23   GLUCOSE 116* 101* 137*  BUN 31* 41* 48*  CREATININE 2.17* 2.35* 2.72*  CALCIUM 7.8* 8.2* 8.2*  PHOS 3.0 4.2 4.5   Recent Labs  Lab 07/29/18 0628 07/29/18 2135 07/30/18 0551 07/31/18 0447  WBC 8.2 6.8 6.6 7.4  NEUTROABS 6.5  --  4.8 5.7  HGB 6.7* 9.4* 9.2* 9.5*  HCT 22.7* 29.5* 30.0* 31.2*  MCV 97.4 89.7 91.2 91.0  PLT 166 177 194 220

## 2018-07-31 NOTE — Plan of Care (Signed)
  Problem: Skin Integrity: Goal: Risk for impaired skin integrity will decrease Outcome: Progressing   

## 2018-07-31 NOTE — Progress Notes (Signed)
PROGRESS NOTE  [name]  [name]:[id number] DOB: Nov 22, 1950 DOA: 07/24/2018 PCP: Bernerd Limbo, MD   Brief Narrative: [name] is a 67 year old female with history of abdominal aortic aneurysm status post vascular repair, chronic kidney disease stage IV, CAD, recent admission to the hospital for acute pancreatitis with pseudocyst, diastolic CHF, chronic pain syndrome who was brought to the hospital 3 to 4 days after last discharge due to persistent somnolence.  She was found to have acute kidney injury for which nephrology was consulted and intermittent dialysis was started. Mental status improved after holding sedating medications and initiation of dialysis.  Assessment & Plan: Principal Problem:   Acute encephalopathy Active Problems:   Essential hypertension   Anemia   Acute renal failure superimposed on stage 4 chronic kidney disease (HCC)   CAD (coronary artery disease)   Chronic diastolic congestive heart failure (HCC)   Hypokalemia   Chronic pain   AKI (acute kidney injury) ([place])   Goals of care, counseling/discussion   DNR (do not resuscitate) discussion  Acute toxic metabolic encephalopathy: Due to polypharmacy, sedating medications and worsened by renal insufficiency. Resolved.  - Pt requesting restart of pain medications, will attempt lowest effective dose of tramadol.  - Hold benzodiazepines.   Acute kidney injury on chronic kidney disease stage IV with possible progression to ESRD: Unclear prognosis of renal recovery at this time.  - Creatinine creeping back up though documented UOP improved. - Nephrology on board, started hemodialysis on 07/27/2018  Left foot fracture: Nondisplaced, suspected due to some trauma during AMS based on timing of symptoms. Some evidence of possible erosive arthropathy, possibly due to hx gout. Uric acid low at 5.1.  - Postop shoe - WBAT  Anemia of chronic kidney disease: s/p 2u PRBCs 11/23 with improvement, stable in 9's.  -  continue supplements as able - Aranesp per nephrology  Chronic pain syndrome:  - Hold cymbalta, DC narcotics  Anxiety:  - DC benzodiazepine with encephalopathy  Acute on chronic diastolic CHF with acute hypoxic respiratory failure: Echo done on 05/2018 showed EF of 60 to 25%, grade 2 diastolic dysfunction, PA peak pressure of 36 mmHg - Improved with dialysis managing volume.  - Wean O2 as able.  CAD with hx CABG, PCI: No chest pain.  - Continue plavix, BB, statin.   Hypertension - Continue current medications, may need augmentation.  Acute pancreatitis with peripancreatic fluid collection: Recently discharged. Has continued to tolerate diet but says abdominal pain restarted during this hospitalization.  - Check LFTs, lipase. Abd exam benign, but may need repeat imaging.  - Tramadol as above - Urged to restrict diet if this is causing symptoms.  Abdominal aortic aneurysm /PAD: s/p common iliac artery stenting. - Continue Plavix  Moderate protein-calorie malnutrition:  - Boost, prostat per dietitian recommendations.   Hypokalemia:  - Replacement per nephrology  DVT prophylaxis: Heparin Code Status: Full Family Communication: None at bedside Disposition Plan: Uncertain. Anticipate SNF venue at [place], though timing is based on renal status.  Consultants:   Nephrology  Procedures:   HD x2  Antimicrobials:  None   Subjective: Right mid/lower abdominal pain is waxing/waning, moderate, modestly improved with tylenol, similar to pancreatitis pain but not as severe. Unsure if po intake worsens it. Also noted loose stools difficult to control.  Objective: Vitals:   07/30/18 2020 07/30/18 2355 07/31/18 0458 07/31/18 0920  BP: (!) 158/44 (!) 151/48 (!) 160/53 (!) 155/48  Pulse: (!) 57  (!) 56 (!) 55  Resp: 18  17 18  Temp: 98.2 F (36.8 C)  99.1 F (37.3 C) 98.9 F (37.2 C)  TempSrc: Oral  Oral Oral  SpO2: 99%  98% 97%  Weight: 58.1 kg     Height:         Intake/Output Summary (Last 24 hours) at 07/31/2018 1424 Last data filed at 07/31/2018 0100 Gross per 24 hour  Intake 270 ml  Output 500 ml  Net -230 ml   Filed Weights   07/29/18 2151 07/30/18 0500 07/30/18 2020  Weight: 57.3 kg 57.3 kg 58.1 kg    Gen: 67 y.o. female in no distress  Pulm: Non-labored breathing supplemental oxygen. Clear to auscultation bilaterally.  CV: Regular borderline bradycardia. No murmur, rub, or gallop. No JVD, trace pedal edema. GI: Abdomen soft, mildly tender diffusely including right lower and upper quadrants, no rebound or guarding. Ext: Warm, no deformities. LUA AVF +thrill, ecchymosis extending distally without fluctuance. Skin: No rashes, lesions or ulcers Neuro: Alert and oriented. No focal neurological deficits. Psych: Judgement and insight appear normal. Mood & affect appropriate.   Data Reviewed: I have personally reviewed following labs and imaging studies  CBC: Recent Labs  Lab 07/26/18 0403  07/28/18 0453 07/29/18 0628 07/29/18 2135 07/30/18 0551 07/31/18 0447  WBC 8.5   < > 11.8* 8.2 6.8 6.6 7.4  NEUTROABS 6.8  --   --  6.5  --  4.8 5.7  HGB 7.2*   < > 7.3* 6.7* 9.4* 9.2* 9.5*  HCT 23.6*   < > 24.6* 22.7* 29.5* 30.0* 31.2*  MCV 96.3   < > 98.4 97.4 89.7 91.2 91.0  PLT 282   < > 204 166 177 194 220   < > = values in this interval not displayed.   Basic Metabolic Panel: Recent Labs  Lab 07/24/18 1954  07/26/18 0403 07/27/18 0524 07/28/18 0453 07/29/18 0628 07/30/18 0551 07/31/18 0447  NA  --    < > 139 142 138 134* 134* 133*  K  --    < > 3.1* 3.6 3.5 3.4* 3.1* 3.3*  CL  --    < > 108 112* 107 101 101 101  CO2  --    < > 20* 20* 21* 26 24 23   GLUCOSE  --    < > 133* 120* 157* 116* 101* 137*  BUN  --    < > 70* 73* 47* 31* 41* 48*  CREATININE  --    < > 3.80* 3.84* 2.90* 2.17* 2.35* 2.72*  CALCIUM  --    < > 8.4* 8.9 8.3* 7.8* 8.2* 8.2*  MG 1.9  --   --   --   --   --   --   --   PHOS  --   --  5.6* 5.7*  --  3.0  4.2 4.5   < > = values in this interval not displayed.   GFR: Estimated Creatinine Clearance: 18.9 mL/min (A) (by C-G formula based on SCr of 2.72 mg/dL (H)). Liver Function Tests: Recent Labs  Lab 07/24/18 1827 07/26/18 0403 07/27/18 0524 07/29/18 0628 07/30/18 0551 07/31/18 0447  AST 15  --   --   --   --   --   ALT 8  --   --   --   --   --   ALKPHOS 51  --   --   --   --   --   BILITOT 0.8  --   --   --   --   --  PROT 6.1*  --   --   --   --   --   ALBUMIN 3.0* 2.4* 2.3* 2.2* 2.3* 2.4*   No results for input(s): LIPASE, AMYLASE in the last 168 hours. Recent Labs  Lab 07/24/18 1954 07/25/18 0420  AMMONIA 41* 19   Coagulation Profile: No results for input(s): INR, PROTIME in the last 168 hours. Cardiac Enzymes: No results for input(s): CKTOTAL, CKMB, CKMBINDEX, TROPONINI in the last 168 hours. BNP (last 3 results) No results for input(s): PROBNP in the last 8760 hours. HbA1C: No results for input(s): HGBA1C in the last 72 hours. CBG: Recent Labs  Lab 07/30/18 0822 07/30/18 1144 07/30/18 1650 07/31/18 0750 07/31/18 1136  GLUCAP 87 189* 102* 104* 209*   Lipid Profile: No results for input(s): CHOL, HDL, LDLCALC, TRIG, CHOLHDL, LDLDIRECT in the last 72 hours. Thyroid Function Tests: No results for input(s): TSH, T4TOTAL, FREET4, T3FREE, THYROIDAB in the last 72 hours. Anemia Panel: No results for input(s): VITAMINB12, FOLATE, FERRITIN, TIBC, IRON, RETICCTPCT in the last 72 hours. Urine analysis:    Component Value Date/Time   COLORURINE YELLOW 07/25/2018 0530   APPEARANCEUR CLEAR 07/25/2018 0530   LABSPEC 1.008 07/25/2018 0530   PHURINE 5.0 07/25/2018 0530   GLUCOSEU NEGATIVE 07/25/2018 0530   HGBUR NEGATIVE 07/25/2018 0530   BILIRUBINUR NEGATIVE 07/25/2018 0530   KETONESUR NEGATIVE 07/25/2018 0530   PROTEINUR NEGATIVE 07/25/2018 0530   UROBILINOGEN 0.2 03/04/2011 2107   NITRITE NEGATIVE 07/25/2018 0530   LEUKOCYTESUR TRACE (A) 07/25/2018 0530   No  results found for this or any previous visit (from the past 240 hour(s)).    Radiology Studies: Dg Foot Complete Left  Result Date: 07/30/2018 CLINICAL DATA:  Lethargy.  Right foot swelling. EXAM: LEFT FOOT - COMPLETE 3+ VIEW COMPARISON:  MRI of the right foot dated 03/27/2016 FINDINGS: Cortical discontinuity in the distal metaphysis of the proximal phalanx small toe suspicious for fracture, not present on 03/19/2016. Small marginal erosion along the base of the proximal phalanx third toe, likewise new compared to the prior exam. No malalignment at the Lisfranc joint. There is some dorsal periosteal elevation in the vicinity of the base of the proximal first metatarsal as suggested on the lateral projection. Notable soft tissue swelling along the distal dorsum of the foot. Plantar calcaneal spur.  Vascular calcification noted. IMPRESSION: 1. Suspected nondisplaced fracture of the distal metaphysis proximal phalanx small toe. 2. Small marginal erosion along the base of the proximal phalanx third toe, potentially from gout or other erosive arthropathy. 3. Notable soft tissue swelling along the distal dorsum of the foot. 4. Dorsal periosteal elevation in the vicinity of the base of the proximal first metatarsal on the lateral projection, mild periostitis is not excluded. Electronically Signed   By: Van Clines M.D.   On: 07/30/2018 18:57   Dg Foot Complete Right  Result Date: 07/30/2018 CLINICAL DATA:  Left foot pain EXAM: RIGHT FOOT COMPLETE - 3+ VIEW COMPARISON:  None. FINDINGS: The toes are flexed on imaging which reduces sensitivity. No malalignment at the Lisfranc joint. Small erosion along the distal first metatarsal head medially, best seen on the oblique projection. Mild dorsal soft tissue swelling along the forefoot. Plantar calcaneal spur noted. IMPRESSION: 1. Mild dorsal soft tissue swelling along the forefoot. 2. Small erosion along the first metatarsal head, possibly a manifestation of  gout arthropathy. Electronically Signed   By: Van Clines M.D.   On: 07/30/2018 18:59    Scheduled Meds: . allopurinol  100 mg Oral Daily  . amLODipine  10 mg Oral Daily  . atorvastatin  40 mg Oral q1800  . carvedilol  12.5 mg Oral BID WC  . Chlorhexidine Gluconate Cloth  6 each Topical Q0600  . clopidogrel  75 mg Oral Q breakfast  . [START ON 08/05/2018] darbepoetin (ARANESP) injection - DIALYSIS  60 mcg Intravenous Q Sat-HD  . feeding supplement  1 Container Oral TID BM  . feeding supplement (PRO-STAT SUGAR FREE 64)  30 mL Oral TID  . gabapentin  300 mg Oral Daily  . heparin  5,000 Units Subcutaneous Q8H  . hydrALAZINE  50 mg Oral Q8H  . mometasone-formoterol  2 puff Inhalation BID  . multivitamin  1 tablet Oral QHS  . pantoprazole  40 mg Oral BID  . sodium chloride flush  3 mL Intravenous Q12H   Continuous Infusions:   LOS: 5 days   Time spent: 35 minutes.  Patrecia Pour, MD [facility] [email or web address] Password [facility] 07/31/2018, 2:24 PM

## 2018-07-31 NOTE — Plan of Care (Signed)
[name] is active with Hospice and [facility],  Please place an order for outpatient palliative services to continue when she returns to SNF/rehab.  Please reconsult PMT for future readmissions.  Quinn Axe, NP

## 2018-07-31 NOTE — Progress Notes (Signed)
Physical Therapy Treatment Patient Details Name: [name] MRN: [id number] DOB: [birth year] Today's Date: 07/31/2018    History of Present Illness Pt is a 67 y.o. female admitted to [facility] 07/24/18 with lethargy and AMS; transferred to [facility] Worked up for AKI on CKD IV. Altered level of consciousness attributed to likely polypharmacy. HD initiated 11/21. PMH includes HTN, CKD IV-V, neuropathy, CAD (s/p CABG), PVD, chronic back pain, COPD. Of note, recent admission from [facility] to [facility] 11/2-11/13 with RUQ pain.; x-ray L foot shows suspectected nondisplaced fracture of the distal metaphysis promximal phalanx small toe.     PT Comments    Pt progressing towards physical therapy goals. Was able to perform transfers and ambulation with gross supervision for safety to min guard assist with RW for support. Feel next session pt could progress to rollator or SPC. Because pt is progressing so well, discussed the option of returning home at d/c with HHPT services to follow-up. Pt agreeable to this and states she would only be alone for ~2 hours every day. Will continue to follow and progress as able per POC.    Follow Up Recommendations  Home health PT;Supervision for mobility/OOB     Equipment Recommendations  None recommended by PT    Recommendations for Other Services       Precautions / Restrictions Precautions Precautions: Fall Restrictions Weight Bearing Restrictions: No    Mobility  Bed Mobility Overal bed mobility: Needs Assistance Bed Mobility: Supine to Sit;Sit to Supine     Supine to sit: Supervision Sit to supine: Supervision   General bed mobility comments: Increased time and use of bed rails required.   Transfers Overall transfer level: Needs assistance Equipment used: Rolling walker (2 wheeled) Transfers: Sit to/from Stand Sit to Stand: Supervision         General transfer comment: VC's for hand placement on seated surface for safety. Supervision as pt  powered-up to full stand.   Ambulation/Gait Ambulation/Gait assistance: Min guard Gait Distance (Feet): 200 Feet Assistive device: Rolling walker (2 wheeled) Gait Pattern/deviations: Step-to pattern;Trunk flexed Gait velocity: Decreased Gait velocity interpretation: <1.8 ft/sec, indicate of risk for recurrent falls General Gait Details: Slow but generally steady. 2 standing rest breaks (<30 seconds) due to fatigue. Pt mildly dyspneic but HR 64 bpm and O2 98% during gait training.    Stairs             Wheelchair Mobility    Modified Rankin (Stroke Patients Only)       Balance Overall balance assessment: Needs assistance Sitting-balance support: Feet supported;Bilateral upper extremity supported Sitting balance-Leahy Scale: Good Sitting balance - Comments: Donned/doffed socks and applied lotion to upper and lower legs without LOB   Standing balance support: No upper extremity supported Standing balance-Leahy Scale: Fair Standing balance comment: statically                            Cognition Arousal/Alertness: Awake/alert Behavior During Therapy: WFL for tasks assessed/performed Overall Cognitive Status: No family/caregiver present to determine baseline cognitive functioning                                 General Comments: Cognition appeasr significnatly imporved since evaluation      Exercises      General Comments        Pertinent Vitals/Pain Pain Assessment: Faces Faces Pain Scale: Hurts little more  Pain Location: side Pain Descriptors / Indicators: Discomfort Pain Intervention(s): Monitored during session    Home Living                      Prior Function            PT Goals (current goals can now be found in the care plan section) Acute Rehab PT Goals Patient Stated Goal: Get stronger at SNF before returning to daughter's home; "get a cigarette" PT Goal Formulation: With patient/family Time For Goal  Achievement: 08/09/18 Potential to Achieve Goals: Good Progress towards PT goals: Progressing toward goals    Frequency    Min 3X/week      PT Plan Discharge plan needs to be updated;Frequency needs to be updated    Co-evaluation              AM-PAC PT "6 Clicks" Mobility   Outcome Measure  Help needed turning from your back to your side while in a flat bed without using bedrails?: None Help needed moving from lying on your back to sitting on the side of a flat bed without using bedrails?: None Help needed moving to and from a bed to a chair (including a wheelchair)?: None Help needed standing up from a chair using your arms (e.g., wheelchair or bedside chair)?: None Help needed to walk in hospital room?: None Help needed climbing 3-5 steps with a railing? : A Little 6 Click Score: 23    End of Session Equipment Utilized During Treatment: Gait belt Activity Tolerance: Patient tolerated treatment well;Patient limited by fatigue Patient left: in bed;with call bell/phone within reach;with family/visitor present Nurse Communication: Mobility status PT Visit Diagnosis: Muscle weakness (generalized) (M62.81);Other abnormalities of gait and mobility (R26.89)     Time: 2297-9892 PT Time Calculation (min) (ACUTE ONLY): 24 min  Charges:  $Gait Training: 23-37 mins                     Rolinda Roan, PT, DPT Acute Rehabilitation Services Pager: [phone] Office: [phone]    Thelma Comp 07/31/2018, 2:46 PM

## 2018-08-01 ENCOUNTER — Inpatient Hospital Stay (HOSPITAL_COMMUNITY): Payer: Medicare Other

## 2018-08-01 LAB — GLUCOSE, CAPILLARY
Glucose-Capillary: 107 mg/dL — ABNORMAL HIGH (ref 70–99)
Glucose-Capillary: 177 mg/dL — ABNORMAL HIGH (ref 70–99)
Glucose-Capillary: 138 mg/dL — ABNORMAL HIGH (ref 70–99)
Glucose-Capillary: 138 mg/dL — ABNORMAL HIGH (ref 70–99)

## 2018-08-01 LAB — HEPATIC FUNCTION PANEL
ALBUMIN: 2.5 g/dL — AB (ref 3.5–5.0)
ALK PHOS: 40 U/L (ref 38–126)
ALT: 7 U/L (ref 0–44)
AST: 15 U/L (ref 15–41)
BILIRUBIN DIRECT: 0.2 mg/dL (ref 0.0–0.2)
BILIRUBIN TOTAL: 0.8 mg/dL (ref 0.3–1.2)
Indirect Bilirubin: 0.6 mg/dL (ref 0.3–0.9)
Total Protein: 5 g/dL — ABNORMAL LOW (ref 6.5–8.1)

## 2018-08-01 LAB — RENAL FUNCTION PANEL
ALBUMIN: 2.5 g/dL — AB (ref 3.5–5.0)
ANION GAP: 9 (ref 5–15)
BUN: 59 mg/dL — AB (ref 8–23)
CALCIUM: 8.2 mg/dL — AB (ref 8.9–10.3)
CO2: 22 mmol/L (ref 22–32)
Chloride: 102 mmol/L (ref 98–111)
Creatinine, Ser: 2.83 mg/dL — ABNORMAL HIGH (ref 0.44–1.00)
GFR calc Af Amer: 19 mL/min — ABNORMAL LOW (ref >60)
GFR calc non Af Amer: 17 mL/min — ABNORMAL LOW (ref >60)
Glucose, Bld: 108 mg/dL — ABNORMAL HIGH (ref 70–99)
POTASSIUM: 3.2 mmol/L — AB (ref 3.5–5.1)
Phosphorus: 5.8 mg/dL — ABNORMAL HIGH (ref 2.5–4.6)
SODIUM: 133 mmol/L — AB (ref 135–145)

## 2018-08-01 LAB — CBC WITH DIFFERENTIAL/PLATELET
ABS IMMATURE GRANULOCYTES: 0.03 10*3/uL (ref 0.00–0.07)
Basophils Absolute: 0.1 10*3/uL (ref 0.0–0.1)
Basophils Relative: 1 %
Eosinophils Absolute: 0.1 10*3/uL (ref 0.0–0.5)
Eosinophils Relative: 2 %
HCT: 30.6 % — ABNORMAL LOW (ref 36.0–46.0)
Hemoglobin: 9.3 g/dL — ABNORMAL LOW (ref 12.0–15.0)
IMMATURE GRANULOCYTES: 1 %
LYMPHS ABS: 0.6 10*3/uL — AB (ref 0.7–4.0)
Lymphocytes Relative: 9 %
MCH: 27.7 pg (ref 26.0–34.0)
MCHC: 30.4 g/dL (ref 30.0–36.0)
MCV: 91.1 fL (ref 80.0–100.0)
MONO ABS: 0.7 10*3/uL (ref 0.1–1.0)
MONOS PCT: 10 %
NEUTROS ABS: 5.1 10*3/uL (ref 1.7–7.7)
NEUTROS PCT: 77 %
Platelets: 242 10*3/uL (ref 150–400)
RBC: 3.36 MIL/uL — ABNORMAL LOW (ref 3.87–5.11)
RDW: 18.7 % — ABNORMAL HIGH (ref 11.5–15.5)
WBC: 6.6 10*3/uL (ref 4.0–10.5)
nRBC: 0 % (ref 0.0–0.2)

## 2018-08-01 LAB — LIPASE, BLOOD: Lipase: 301 U/L — ABNORMAL HIGH (ref 11–51)

## 2018-08-01 MED ORDER — OXYCODONE HCL 5 MG PO TABS
5.0000 mg | ORAL_TABLET | ORAL | Status: DC | PRN
  Administered 2018-08-01 (×2): 5 mg via ORAL
  Filled 2018-08-01 (×3): qty 1

## 2018-08-01 MED ORDER — HYDRALAZINE HCL 50 MG PO TABS
75.0000 mg | ORAL_TABLET | Freq: Three times a day (TID) | ORAL | Status: DC
  Administered 2018-08-01 – 2018-08-03 (×6): 75 mg via ORAL
  Filled 2018-08-01 (×6): qty 1

## 2018-08-01 MED ORDER — HYDROMORPHONE HCL 1 MG/ML IJ SOLN
0.5000 mg | INTRAMUSCULAR | Status: DC | PRN
  Administered 2018-08-01 – 2018-08-02 (×8): 0.5 mg via INTRAVENOUS
  Filled 2018-08-01 (×8): qty 1

## 2018-08-01 NOTE — Progress Notes (Signed)
OT Cancellation Note  Patient Details Name: LAELLE BRIDGETT MRN: [id number] DOB: 1951/08/19  Pt declining any OOB or bed level participation in therapy d/t stomach pain- RN aware. OT will continue to follow.   Cancelled Treatment:    Reason Eval/Treat Not Completed: Pain limiting ability to participate;Fatigue/lethargy limiting ability to participate  Curtis Sites OTR/L 08/01/2018, 9:22 AM

## 2018-08-01 NOTE — Progress Notes (Signed)
Admit: 07/24/2018 LOS: 46  [name] with progressive renal failure, probable ESRD  Subjective:  . Complains of insomnia and pain . Tolerating diet . Serum creatinine slightly worsened to 2.83, potassium and serum bicarbonate are stable. . Urine output 0.7 L yesterday, 0.4 L thus far today. . Last HD Tx 11/22  11/25 0701 - 11/26 0700 In: 640 [P.O.:640] Out: 650 [Urine:650]  Filed Weights   07/29/18 2151 07/30/18 0500 07/30/18 2020  Weight: 57.3 kg 57.3 kg 58.1 kg    Scheduled Meds: . allopurinol  100 mg Oral Daily  . amLODipine  10 mg Oral Daily  . atorvastatin  40 mg Oral q1800  . carvedilol  12.5 mg Oral BID WC  . Chlorhexidine Gluconate Cloth  6 each Topical Q0600  . clopidogrel  75 mg Oral Q breakfast  . [START ON 08/05/2018] darbepoetin (ARANESP) injection - DIALYSIS  60 mcg Intravenous Q Sat-HD  . feeding supplement  1 Container Oral TID BM  . feeding supplement (PRO-STAT SUGAR FREE 64)  30 mL Oral TID  . gabapentin  300 mg Oral Daily  . heparin  5,000 Units Subcutaneous Q8H  . hydrALAZINE  50 mg Oral Q8H  . mometasone-formoterol  2 puff Inhalation BID  . multivitamin  1 tablet Oral QHS  . pantoprazole  40 mg Oral BID  . sodium chloride flush  3 mL Intravenous Q12H   Continuous Infusions: PRN Meds:.acetaminophen **OR** acetaminophen, naLOXone (NARCAN)  injection, ondansetron **OR** ondansetron (ZOFRAN) IV, traMADol  Current Labs: reviewed    Physical Exam:  Blood pressure (!) 159/49, pulse (!) 56, temperature 98.3 F (36.8 C), temperature source Oral, resp. rate 19, height 5\' 6"  (1.676 m), weight 58.1 kg, SpO2 94 %. NAD RRR CTAB LUE AVF +B/T L>R LEE, 3+ on L Nonfocal, aao x3  A 1. 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 AMS, continues to improve 3. HTN, BPs stable for now 4. Hypokalemia, K 3.2, monitor will need higher K bath if HD resumed 5. Malnutrition, hx/o pancreatitis 6. ANemia, on ESA qSat; s/p tranfusion 11/23 during admit   P . No strong  indication for resuming hemodialysis today, in fact she might be leveling out. . Will follow for another 24 hours . Daily weights, Daily Renal Panel, Strict I/Os, Avoid nephrotoxins (NSAIDs, judicious IV Contrast)    Pearson Grippe MD 08/01/2018, 11:14 AM  Recent Labs  Lab 07/30/18 0551 07/31/18 0447 08/01/18 0658  NA 134* 133* 133*  K 3.1* 3.3* 3.2*  CL 101 101 102  CO2 24 23 22   GLUCOSE 101* 137* 108*  BUN 41* 48* 59*  CREATININE 2.35* 2.72* 2.83*  CALCIUM 8.2* 8.2* 8.2*  PHOS 4.2 4.5 5.8*   Recent Labs  Lab 07/30/18 0551 07/31/18 0447 08/01/18 0658  WBC 6.6 7.4 6.6  NEUTROABS 4.8 5.7 5.1  HGB 9.2* 9.5* 9.3*  HCT 30.0* 31.2* 30.6*  MCV 91.2 91.0 91.1  PLT 194 220 242

## 2018-08-01 NOTE — Progress Notes (Signed)
PROGRESS NOTE  GLORYA BARTLEY  TDV:[id number] DOB: May 07, 1951 DOA: 07/24/2018 PCP: Bernerd Limbo, MD   Brief Narrative: RHIANNE SOMAN is a 67 year old female with history of abdominal aortic aneurysm status post vascular repair, chronic kidney disease stage IV, CAD, recent admission to the hospital for acute pancreatitis with pseudocyst, diastolic CHF, chronic pain syndrome who was brought to the hospital 3 to 4 days after last discharge due to persistent somnolence.  She was found to have acute kidney injury for which nephrology was consulted and intermittent dialysis was started. Mental status improved after holding sedating medications and initiation of dialysis.  Assessment & Plan: Principal Problem:   Acute encephalopathy Active Problems:   Essential hypertension   Anemia   Acute renal failure superimposed on stage 4 chronic kidney disease (HCC)   CAD (coronary artery disease)   Chronic diastolic congestive heart failure (HCC)   Hypokalemia   Chronic pain   AKI (acute kidney injury) ([place])   Goals of care, counseling/discussion   DNR (do not resuscitate) discussion  Acute toxic metabolic encephalopathy: Due to polypharmacy, sedating medications and worsened by renal insufficiency. Resolved.  - Restarting pain medications in setting of pancreatitis, will monitor mental status closely and HOLD for sedation. This was discussed with RN. - Hold benzodiazepines.   Acute kidney injury on chronic kidney disease stage IV with possible progression to ESRD: Unclear prognosis of renal recovery at this time.  - Creatinine creeping back up. Per nephrology. - Nephrology on board, started hemodialysis on 07/27/2018  Acute on chronic diastolic CHF with acute hypoxic respiratory failure: Echo done on 05/2018 showed EF of 60 to 06%, grade 2 diastolic dysfunction, PA peak pressure of 36 mmHg. Anasarca also noted, likely hypoalbuminemia contributing. - Improved with dialysis managing volume.  - Wean  O2 as able.  Acute recurrent pancreatitis with peripancreatic fluid collection: Biliary etiology initially s/p cholecystectomy with prominent cystic duct remnant and 58mm stone not felt to be cause of symptoms/amenable to intervention at previous hospitalization.  - Symptoms began reworsening around 11/25, lipase up from prior at 307 on 11/26, pain worse and exam with tenderness not just in epigastrium. Will check CT abd/pelvis. Pt unable to tolerate po contrast. LFTs reassuring. - Clear liquids/sips with meds.  - Continue IV antiemetic - Start low dose dilaudid IV prn severe pain and oxycodone prn moderate pain. Monitor mentation very closely. She tolerated these agents at higher doses at previous admission.     Left foot fracture: Nondisplaced, suspected due to some trauma during AMS based on timing of symptoms. Some evidence of possible erosive arthropathy, possibly due to hx gout. Uric acid low at 5.1.  - Postop shoe - WBAT  Anemia of chronic kidney disease: s/p 2u PRBCs 11/23 with improvement, stable in 9's.  - Continue supplements as able - Aranesp per nephrology  Chronic pain syndrome:  - Hold cymbalta  Anxiety:  - DC benzodiazepine with encephalopathy  CAD with hx CABG, PCI: No chest pain.  - Continue plavix, BB, statin.   Hypertension - Continue current medications, increase hydralazine to 75mg . Further titration of coreg limited by borderline bradycardia.  Abdominal aortic aneurysm /PAD: s/p common iliac artery stenting. - Continue plavix  Moderate protein-calorie malnutrition:  - Boost, prostat per dietitian recommendations.   Hypokalemia:  - Replacement per nephrology  DVT prophylaxis: Heparin Code Status: Full Family Communication: None at bedside Disposition Plan: Uncertain. Anticipate SNF venue at [place], though timing is based on renal status and clinical evolution regarding pancreatitis.  Consultants:   Nephrology  Procedures:   HD x2, last was  11/22.  Antimicrobials:  None   Subjective: Developed vomiting after taking po today, abdominal pain is worsening, typical distribution of her past pancreatitis flares. Tramadol is not helping.   Objective: Vitals:   07/31/18 2100 08/01/18 0520 08/01/18 0754 08/01/18 0924  BP: (!) 165/54 (!) 160/68  (!) 159/49  Pulse: 61 68  (!) 56  Resp: 18 18  19   Temp: 98 F (36.7 C) 98.7 F (37.1 C)  98.3 F (36.8 C)  TempSrc: Oral Oral  Oral  SpO2: 99% 99% 99% 94%  Weight:      Height:        Intake/Output Summary (Last 24 hours) at 08/01/2018 1628 Last data filed at 08/01/2018 1239 Gross per 24 hour  Intake 520 ml  Output 950 ml  Net -430 ml   Filed Weights   07/29/18 2151 07/30/18 0500 07/30/18 2020  Weight: 57.3 kg 57.3 kg 58.1 kg   Gen: 67 y.o. female in no distress Pulm: Nonlabored breathing room air today. Clear. CV: Regular rate and rhythm. No murmur, rub, or gallop. No JVD, trace dependent edema. GI: Abdomen soft, tender in epigastrium, right quadrants without rebound, +guarding. +BS. Cannot palpate mass.  Ext: Warm, no deformities. LUA AVF +thrill. Skin: No new rashes, lesions or ulcers on visualized skin.  Neuro: Alert and oriented. No focal neurological deficits. Psych: Judgement and insight appear fair. Mood euthymic & affect congruent. Behavior is appropriate.    Data Reviewed: I have personally reviewed following labs and imaging studies  CBC: Recent Labs  Lab 07/26/18 0403  07/29/18 0628 07/29/18 2135 07/30/18 0551 07/31/18 0447 08/01/18 0658  WBC 8.5   < > 8.2 6.8 6.6 7.4 6.6  NEUTROABS 6.8  --  6.5  --  4.8 5.7 5.1  HGB 7.2*   < > 6.7* 9.4* 9.2* 9.5* 9.3*  HCT 23.6*   < > 22.7* 29.5* 30.0* 31.2* 30.6*  MCV 96.3   < > 97.4 89.7 91.2 91.0 91.1  PLT 282   < > 166 177 194 220 242   < > = values in this interval not displayed.   Basic Metabolic Panel: Recent Labs  Lab 07/27/18 0524 07/28/18 0453 07/29/18 0628 07/30/18 0551 07/31/18 0447  08/01/18 0658  NA 142 138 134* 134* 133* 133*  K 3.6 3.5 3.4* 3.1* 3.3* 3.2*  CL 112* 107 101 101 101 102  CO2 20* 21* 26 24 23 22   GLUCOSE 120* 157* 116* 101* 137* 108*  BUN 73* 47* 31* 41* 48* 59*  CREATININE 3.84* 2.90* 2.17* 2.35* 2.72* 2.83*  CALCIUM 8.9 8.3* 7.8* 8.2* 8.2* 8.2*  PHOS 5.7*  --  3.0 4.2 4.5 5.8*   GFR: Estimated Creatinine Clearance: 18.2 mL/min (A) (by C-G formula based on SCr of 2.83 mg/dL (H)). Liver Function Tests: Recent Labs  Lab 07/27/18 0524 07/29/18 0628 07/30/18 0551 07/31/18 0447 08/01/18 0658  AST  --   --   --   --  15  ALT  --   --   --   --  7  ALKPHOS  --   --   --   --  40  BILITOT  --   --   --   --  0.8  PROT  --   --   --   --  5.0*  ALBUMIN 2.3* 2.2* 2.3* 2.4* 2.5*  2.5*   Recent Labs  Lab 08/01/18 0658  LIPASE  301*   No results for input(s): AMMONIA in the last 168 hours. Coagulation Profile: No results for input(s): INR, PROTIME in the last 168 hours. Cardiac Enzymes: No results for input(s): CKTOTAL, CKMB, CKMBINDEX, TROPONINI in the last 168 hours. BNP (last 3 results) No results for input(s): PROBNP in the last 8760 hours. HbA1C: No results for input(s): HGBA1C in the last 72 hours. CBG: Recent Labs  Lab 07/31/18 1136 07/31/18 1649 07/31/18 2100 08/01/18 0721 08/01/18 1112  GLUCAP 209* 132* 152* 107* 177*   Lipid Profile: No results for input(s): CHOL, HDL, LDLCALC, TRIG, CHOLHDL, LDLDIRECT in the last 72 hours. Thyroid Function Tests: No results for input(s): TSH, T4TOTAL, FREET4, T3FREE, THYROIDAB in the last 72 hours. Anemia Panel: No results for input(s): VITAMINB12, FOLATE, FERRITIN, TIBC, IRON, RETICCTPCT in the last 72 hours. Urine analysis:    Component Value Date/Time   COLORURINE YELLOW 07/25/2018 0530   APPEARANCEUR CLEAR 07/25/2018 0530   LABSPEC 1.008 07/25/2018 0530   PHURINE 5.0 07/25/2018 0530   GLUCOSEU NEGATIVE 07/25/2018 0530   HGBUR NEGATIVE 07/25/2018 0530   BILIRUBINUR NEGATIVE  07/25/2018 0530   KETONESUR NEGATIVE 07/25/2018 0530   PROTEINUR NEGATIVE 07/25/2018 0530   UROBILINOGEN 0.2 03/04/2011 2107   NITRITE NEGATIVE 07/25/2018 0530   LEUKOCYTESUR TRACE (A) 07/25/2018 0530   No results found for this or any previous visit (from the past 240 hour(s)).    Radiology Studies: Dg Foot Complete Left  Result Date: 07/30/2018 CLINICAL DATA:  Lethargy.  Right foot swelling. EXAM: LEFT FOOT - COMPLETE 3+ VIEW COMPARISON:  MRI of the right foot dated 03/27/2016 FINDINGS: Cortical discontinuity in the distal metaphysis of the proximal phalanx small toe suspicious for fracture, not present on 03/19/2016. Small marginal erosion along the base of the proximal phalanx third toe, likewise new compared to the prior exam. No malalignment at the Lisfranc joint. There is some dorsal periosteal elevation in the vicinity of the base of the proximal first metatarsal as suggested on the lateral projection. Notable soft tissue swelling along the distal dorsum of the foot. Plantar calcaneal spur.  Vascular calcification noted. IMPRESSION: 1. Suspected nondisplaced fracture of the distal metaphysis proximal phalanx small toe. 2. Small marginal erosion along the base of the proximal phalanx third toe, potentially from gout or other erosive arthropathy. 3. Notable soft tissue swelling along the distal dorsum of the foot. 4. Dorsal periosteal elevation in the vicinity of the base of the proximal first metatarsal on the lateral projection, mild periostitis is not excluded. Electronically Signed   By: Van Clines M.D.   On: 07/30/2018 18:57   Dg Foot Complete Right  Result Date: 07/30/2018 CLINICAL DATA:  Left foot pain EXAM: RIGHT FOOT COMPLETE - 3+ VIEW COMPARISON:  None. FINDINGS: The toes are flexed on imaging which reduces sensitivity. No malalignment at the Lisfranc joint. Small erosion along the distal first metatarsal head medially, best seen on the oblique projection. Mild dorsal soft  tissue swelling along the forefoot. Plantar calcaneal spur noted. IMPRESSION: 1. Mild dorsal soft tissue swelling along the forefoot. 2. Small erosion along the first metatarsal head, possibly a manifestation of gout arthropathy. Electronically Signed   By: Van Clines M.D.   On: 07/30/2018 18:59    Scheduled Meds: . allopurinol  100 mg Oral Daily  . amLODipine  10 mg Oral Daily  . atorvastatin  40 mg Oral q1800  . carvedilol  12.5 mg Oral BID WC  . Chlorhexidine Gluconate Cloth  6 each Topical Q0600  .  clopidogrel  75 mg Oral Q breakfast  . [START ON 08/05/2018] darbepoetin (ARANESP) injection - DIALYSIS  60 mcg Intravenous Q Sat-HD  . feeding supplement  1 Container Oral TID BM  . feeding supplement (PRO-STAT SUGAR FREE 64)  30 mL Oral TID  . gabapentin  300 mg Oral Daily  . heparin  5,000 Units Subcutaneous Q8H  . hydrALAZINE  50 mg Oral Q8H  . mometasone-formoterol  2 puff Inhalation BID  . multivitamin  1 tablet Oral QHS  . pantoprazole  40 mg Oral BID  . sodium chloride flush  3 mL Intravenous Q12H   Continuous Infusions:   LOS: 6 days   Time spent: 35 minutes.  Patrecia Pour, MD [facility] [email or web address] Password [facility] 08/01/2018, 4:28 PM

## 2018-08-01 NOTE — Care Management Important Message (Signed)
Important Message  Patient Details  Name: [name] MRN: [id number] Date of Birth: 1951/05/24   Medicare Important Message Given:  Yes    Barb Merino Keaun Schnabel 08/01/2018, 4:41 PM

## 2018-08-01 NOTE — Care Management (Addendum)
CM confirmed with [facility] that pt is active with agency for [facility], [place], [place], aide and social work.  CM requested resumption orders.  Pt confirmed she lives alone - family can help assist her at discharge if needed but she will not have 24/7 supervision..  Pt declined SNF and would like to discharge home.

## 2018-08-02 DIAGNOSIS — K859 Acute pancreatitis without necrosis or infection, unspecified: Secondary | ICD-10-CM

## 2018-08-02 DIAGNOSIS — R1084 Generalized abdominal pain: Secondary | ICD-10-CM

## 2018-08-02 LAB — GLUCOSE, CAPILLARY
GLUCOSE-CAPILLARY: 110 mg/dL — AB (ref 70–99)
GLUCOSE-CAPILLARY: 153 mg/dL — AB (ref 70–99)
Glucose-Capillary: 117 mg/dL — ABNORMAL HIGH (ref 70–99)
Glucose-Capillary: 125 mg/dL — ABNORMAL HIGH (ref 70–99)

## 2018-08-02 LAB — RENAL FUNCTION PANEL
Albumin: 2.6 g/dL — ABNORMAL LOW (ref 3.5–5.0)
Anion gap: 11 (ref 5–15)
BUN: 63 mg/dL — AB (ref 8–23)
CHLORIDE: 101 mmol/L (ref 98–111)
CO2: 22 mmol/L (ref 22–32)
Calcium: 8.4 mg/dL — ABNORMAL LOW (ref 8.9–10.3)
Creatinine, Ser: 2.92 mg/dL — ABNORMAL HIGH (ref 0.44–1.00)
GFR calc Af Amer: 19 mL/min — ABNORMAL LOW (ref >60)
GFR calc non Af Amer: 16 mL/min — ABNORMAL LOW (ref >60)
GLUCOSE: 146 mg/dL — AB (ref 70–99)
POTASSIUM: 3.1 mmol/L — AB (ref 3.5–5.1)
Phosphorus: 6 mg/dL — ABNORMAL HIGH (ref 2.5–4.6)
Sodium: 134 mmol/L — ABNORMAL LOW (ref 135–145)

## 2018-08-02 LAB — CBC WITH DIFFERENTIAL/PLATELET
ABS IMMATURE GRANULOCYTES: 0.05 10*3/uL (ref 0.00–0.07)
Basophils Absolute: 0 10*3/uL (ref 0.0–0.1)
Basophils Relative: 1 %
Eosinophils Absolute: 0.1 10*3/uL (ref 0.0–0.5)
Eosinophils Relative: 1 %
HCT: 30.7 % — ABNORMAL LOW (ref 36.0–46.0)
HEMOGLOBIN: 9.6 g/dL — AB (ref 12.0–15.0)
IMMATURE GRANULOCYTES: 1 %
LYMPHS PCT: 6 %
Lymphs Abs: 0.5 10*3/uL — ABNORMAL LOW (ref 0.7–4.0)
MCH: 28.6 pg (ref 26.0–34.0)
MCHC: 31.3 g/dL (ref 30.0–36.0)
MCV: 91.4 fL (ref 80.0–100.0)
MONO ABS: 0.8 10*3/uL (ref 0.1–1.0)
MONOS PCT: 9 %
NEUTROS ABS: 7.3 10*3/uL (ref 1.7–7.7)
Neutrophils Relative %: 82 %
Platelets: 269 10*3/uL (ref 150–400)
RBC: 3.36 MIL/uL — AB (ref 3.87–5.11)
RDW: 18.6 % — ABNORMAL HIGH (ref 11.5–15.5)
WBC: 8.7 10*3/uL (ref 4.0–10.5)
nRBC: 0 % (ref 0.0–0.2)

## 2018-08-02 MED ORDER — BOOST / RESOURCE BREEZE PO LIQD CUSTOM
1.0000 | Freq: Three times a day (TID) | ORAL | Status: DC
  Administered 2018-08-02 – 2018-08-03 (×4): 1 via ORAL
  Filled 2018-08-02 (×6): qty 1

## 2018-08-02 MED ORDER — PANTOPRAZOLE SODIUM 40 MG PO TBEC
40.0000 mg | DELAYED_RELEASE_TABLET | Freq: Every day | ORAL | Status: DC
  Administered 2018-08-03: 40 mg via ORAL
  Filled 2018-08-02: qty 1

## 2018-08-02 MED ORDER — CALCIUM CARBONATE ANTACID 500 MG PO CHEW
1.0000 | CHEWABLE_TABLET | Freq: Three times a day (TID) | ORAL | Status: DC | PRN
  Administered 2018-08-02: 200 mg via ORAL
  Filled 2018-08-02: qty 1

## 2018-08-02 MED ORDER — HYDROMORPHONE HCL 1 MG/ML IJ SOLN
0.5000 mg | INTRAMUSCULAR | Status: DC | PRN
  Administered 2018-08-03 (×2): 0.5 mg via INTRAVENOUS
  Filled 2018-08-02 (×2): qty 1

## 2018-08-02 MED ORDER — HYDROMORPHONE HCL 2 MG PO TABS
2.0000 mg | ORAL_TABLET | ORAL | Status: DC | PRN
  Administered 2018-08-02 – 2018-08-03 (×4): 2 mg via ORAL
  Filled 2018-08-02 (×4): qty 1

## 2018-08-02 MED ORDER — POTASSIUM CHLORIDE 10 MEQ/100ML IV SOLN
10.0000 meq | INTRAVENOUS | Status: AC
  Administered 2018-08-02 (×4): 10 meq via INTRAVENOUS
  Filled 2018-08-02 (×4): qty 100

## 2018-08-02 MED ORDER — PANCRELIPASE (LIP-PROT-AMYL) 12000-38000 UNITS PO CPEP
12000.0000 [IU] | ORAL_CAPSULE | Freq: Three times a day (TID) | ORAL | Status: DC
  Administered 2018-08-02 – 2018-08-03 (×4): 12000 [IU] via ORAL
  Filled 2018-08-02 (×4): qty 1

## 2018-08-02 NOTE — Progress Notes (Signed)
Occupational Therapy Treatment Patient Details Name: [name] MRN: [id number] DOB: [birth year] Today's Date: 08/02/2018    History of present illness Pt is a 67 y.o. female admitted to [facility] 07/24/18 with lethargy and AMS; transferred to [facility]. Worked up for AKI on CKD IV. Altered level of consciousness attributed to likely polypharmacy. HD initiated 11/21. PMH includes HTN, CKD IV-V, neuropathy, CAD (s/p CABG), PVD, chronic back pain, COPD. Of note, recent admission from [facility] to [facility] 11/2-11/13 with RUQ pain.; x-ray L foot shows suspectected nondisplaced fracture of the distal metaphysis promximal phalanx small toe.    OT comments  Pt making great progress toward her OT goals. Based on today's performance with full b/d routine- feel pt can go home with [facility] and confirmation of family support. Recommend family education take place to inform of pt needing (S) during shower transfers and functional mobility initially.    Follow Up Recommendations  Home health OT    Equipment Recommendations  None recommended by OT    Recommendations for Other Services      Precautions / Restrictions Precautions Precautions: Fall Restrictions Weight Bearing Restrictions: No       Mobility Bed Mobility Overal bed mobility: Modified Independent             General bed mobility comments: Increased time and use of bed rails  Transfers Overall transfer level: Needs assistance Equipment used: Rolling walker (2 wheeled) Transfers: Sit to/from Stand Sit to Stand: Supervision              Balance Overall balance assessment: Mild deficits observed, not formally tested                                         ADL either performed or assessed with clinical judgement   ADL Overall ADL's : Needs assistance/impaired Eating/Feeding: Modified independent   Grooming: Modified independent;Wash/dry hands;Wash/dry face   Upper Body Bathing: Set up;Standing   Lower Body  Bathing: Set up;Cueing for safety;Sit to/from stand   Upper Body Dressing : Set up;Sitting   Lower Body Dressing: Supervision/safety;Cueing for safety;Sit to/from stand                                 Cognition Arousal/Alertness: Awake/alert Behavior During Therapy: WFL for tasks assessed/performed Overall Cognitive Status: Within Functional Limits for tasks assessed                                                General Comments Pt with increased spirits and mood, joking appropriately with therapist    Pertinent Vitals/ Pain       Pain Assessment: 0-10 Pain Score: 6  Pain Location: stomach Pain Descriptors / Indicators: Aching Pain Intervention(s): Repositioned         Frequency  Min 2X/week        Progress Toward Goals  OT Goals(current goals can now be found in the care plan section)  Progress towards OT goals: Progressing toward goals  Acute Rehab OT Goals Patient Stated Goal: "go home with my daughter" OT Goal Formulation: With patient Time For Goal Achievement: 08/11/18 Potential to Achieve Goals: Good  Plan Discharge plan needs to be updated  AM-PAC OT "6 Clicks" Daily Activity     Outcome Measure   Help from another person eating meals?: None Help from another person taking care of personal grooming?: None Help from another person toileting, which includes using toliet, bedpan, or urinal?: A Little Help from another person bathing (including washing, rinsing, drying)?: A Little Help from another person to put on and taking off regular upper body clothing?: None Help from another person to put on and taking off regular lower body clothing?: A Little 6 Click Score: 21    End of Session Equipment Utilized During Treatment: Rolling walker  OT Visit Diagnosis: Muscle weakness (generalized) (M62.81)   Activity Tolerance Patient tolerated treatment well   Patient Left in bed;with call bell/phone within reach;with  bed alarm set;with nursing/sitter in room   Nurse Communication Mobility status        Time: 1120-1140 OT Time Calculation (min): 20 min  Charges: OT General Charges $OT Visit: 1 Visit OT Treatments $Self Care/Home Management : 8-22 mins   Curtis Sites OTR/L  08/02/2018, 1:11 PM

## 2018-08-02 NOTE — Progress Notes (Signed)
Admit: 07/24/2018 LOS: 7  [name] with progressive renal failure, probable ESRD  Subjective:  . Serum creatinine only slightly worsened to 2.92, potassium 3.1, bicarbonate 22 . 1.4 L urine output yesterday . Vital signs are stable . Last HD Tx 11/22  11/26 0701 - 11/27 0700 In: 363 [P.O.:360; I.V.:3] Out: 1500 [Urine:1400; Emesis/NG output:100]  Filed Weights   07/30/18 0500 07/30/18 2020 08/01/18 2036  Weight: 57.3 kg 58.1 kg 62.7 kg    Scheduled Meds: . allopurinol  100 mg Oral Daily  . amLODipine  10 mg Oral Daily  . atorvastatin  40 mg Oral q1800  . carvedilol  12.5 mg Oral BID WC  . Chlorhexidine Gluconate Cloth  6 each Topical Q0600  . clopidogrel  75 mg Oral Q breakfast  . [START ON 08/05/2018] darbepoetin (ARANESP) injection - DIALYSIS  60 mcg Intravenous Q Sat-HD  . feeding supplement  1 Container Oral TID BM  . feeding supplement (PRO-STAT SUGAR FREE 64)  30 mL Oral TID  . gabapentin  300 mg Oral Daily  . heparin  5,000 Units Subcutaneous Q8H  . hydrALAZINE  75 mg Oral Q8H  . mometasone-formoterol  2 puff Inhalation BID  . multivitamin  1 tablet Oral QHS  . pantoprazole  40 mg Oral BID  . sodium chloride flush  3 mL Intravenous Q12H   Continuous Infusions: . potassium chloride 10 mEq (08/02/18 1003)   PRN Meds:.acetaminophen **OR** acetaminophen, calcium carbonate, HYDROmorphone (DILAUDID) injection, naLOXone (NARCAN)  injection, ondansetron **OR** ondansetron (ZOFRAN) IV, oxyCODONE  Current Labs: reviewed    Physical Exam:  Blood pressure (!) 136/42, pulse 70, temperature 98.8 F (37.1 C), temperature source Oral, resp. rate 18, height 5\' 6"  (1.676 m), weight 62.7 kg, SpO2 97 %. NAD RRR CTAB LUE AVF +B/T L>R LEE, 2+ on L Nonfocal, aao x3  A 1. Advanced CKD4, possibly progressed to ESRD; however labs in the last 48 hours suggest that she will retain sufficient GFR to stay off dialysis; need another day or 2 to sort this out 2. AMS, continues to  improve 3. HTN, BPs stable for now 4. Hypokalemia, K 3.1, will replace with 40 mEq daily 5. Malnutrition, hx/o pancreatitis 6. ANemia, on ESA qSat; s/p tranfusion 11/23 during admit   P . No dialysis, continue to follow closely. . Will follow for another 24 hours . Daily weights, Daily Renal Panel, Strict I/Os, Avoid nephrotoxins (NSAIDs, judicious IV Contrast)    Pearson Grippe MD 08/02/2018, 10:55 AM  Recent Labs  Lab 07/31/18 0447 08/01/18 0658 08/02/18 0520  NA 133* 133* 134*  K 3.3* 3.2* 3.1*  CL 101 102 101  CO2 23 22 22   GLUCOSE 137* 108* 146*  BUN 48* 59* 63*  CREATININE 2.72* 2.83* 2.92*  CALCIUM 8.2* 8.2* 8.4*  PHOS 4.5 5.8* 6.0*   Recent Labs  Lab 07/31/18 0447 08/01/18 0658 08/02/18 0520  WBC 7.4 6.6 8.7  NEUTROABS 5.7 5.1 7.3  HGB 9.5* 9.3* 9.6*  HCT 31.2* 30.6* 30.7*  MCV 91.0 91.1 91.4  PLT 220 242 269

## 2018-08-02 NOTE — Progress Notes (Signed)
PROGRESS NOTE  [name]  EAV:[id number] DOB: June 11, 1951 DOA: 07/24/2018 PCP: Bernerd Limbo, MD   Brief Narrative: [name] is a 67 year old female with history of abdominal aortic aneurysm status post vascular repair, chronic kidney disease stage IV, CAD, recent admission to the hospital for acute pancreatitis with pseudocyst, diastolic CHF, chronic pain syndrome who was brought to the hospital 3 to 4 days after last discharge due to persistent somnolence.  She was found to have acute kidney injury for which nephrology was consulted and intermittent dialysis was started. Mental status improved after holding sedating medications and initiation of dialysis. With the holding of pain medications abdominal pain returned. Repeat CT abdomen performed and GI consulted.  Assessment & Plan: Principal Problem:   Acute encephalopathy Active Problems:   Essential hypertension   Anemia   Acute renal failure superimposed on stage 4 chronic kidney disease (HCC)   CAD (coronary artery disease)   Chronic diastolic congestive heart failure (HCC)   Hypokalemia   Chronic pain   AKI (acute kidney injury) ([place])   Goals of care, counseling/discussion   DNR (do not resuscitate) discussion  Acute toxic metabolic encephalopathy: Due to polypharmacy, sedating medications and worsened by renal insufficiency. Resolved.  - Will transition pain medications to dilaudid po and IV with emphasis on po only. Quick intervals for dose finding. Hold for sedation. - Holding benzodiazepines.   Acute kidney injury on chronic kidney disease stage IV: Unclear prognosis of renal recovery at this time.  - Creatinine rate of rise improving, nephrology following for consideration of intermittent HD though does not appear to have progressed to ESRD.  Acute on chronic diastolic CHF with acute hypoxic respiratory failure: Echo done on 05/2018 showed EF of 60 to 78%, grade 2 diastolic dysfunction, PA peak pressure of 36 mmHg.  Anasarca also noted, likely hypoalbuminemia contributing. - Improved with dialysis managing volume. Having pitting edema.   - Hypoxia resolved.  Acute recurrent pancreatitis with peripancreatic fluid collection: Biliary etiology initially s/p cholecystectomy with prominent cystic duct remnant and 27mm stone not felt to be cause of symptoms/amenable to intervention at previous hospitalization.  - Symptoms began reworsening around 11/25, lipase up from prior at 307 on 11/26. CT abdomen and pelvis demonstrated fluid collection and duodenal thickening without evidence of necrosis and no systemic signs of infection.  - Appreciate recommendations from GI today. - Clear liquids/sips with meds.  - Continue IV antiemetic - Start po and IV dilaudid as above.   Left foot fracture: Nondisplaced, suspected due to some trauma during AMS based on timing of symptoms. Some evidence of possible erosive arthropathy, possibly due to hx gout. Uric acid low at 5.1.  - Postop shoe - WBAT  Anemia of chronic kidney disease: s/p 2u PRBCs 11/23 with improvement, stable in 9's.  - Continue supplements as able - Aranesp per nephrology  Chronic pain syndrome:  - Holding cymbalta  Anxiety:  - DC benzodiazepine with encephalopathy  CAD with hx CABG, PCI: No chest pain.  - Continue plavix, BB, statin.   Hypertension - Continue current medications, increased hydralazine to 75mg . Further titration of coreg limited by borderline bradycardia.  Abdominal aortic aneurysm /PAD: s/p endoluminal stenting. - Continue plavix  Moderate protein-calorie malnutrition:  - Boost, prostat per dietitian recommendations.   Hypokalemia:  - Replacement per nephrology  DVT prophylaxis: Heparin Code Status: Full Family Communication: None at bedside Disposition Plan: Uncertain. Mobility improving. If tolerating po and no need for IV pain medications and creatinine stabilizes, would  be clear for discharge.   Consultants:    Nephrology  GI  Procedures:   HD x2, last was 11/22.  Antimicrobials:  None   Subjective: No vomiting today, abdominal pain improved with IV dilaudid, no sedation or confusion.  Objective: Vitals:   08/01/18 1950 08/01/18 2036 08/02/18 0416 08/02/18 0817  BP:  (!) 152/64 (!) 136/42   Pulse:  60 66 70  Resp:  18 18 18   Temp:  98.6 F (37 C) 98.8 F (37.1 C)   TempSrc:  Oral Oral   SpO2: 97% 96% 93% 97%  Weight:  62.7 kg    Height:        Intake/Output Summary (Last 24 hours) at 08/02/2018 1631 Last data filed at 08/02/2018 1500 Gross per 24 hour  Intake 1123 ml  Output 1200 ml  Net -77 ml   Filed Weights   07/30/18 0500 07/30/18 2020 08/01/18 2036  Weight: 57.3 kg 58.1 kg 62.7 kg   Gen: 67 y.o. female in no distress Pulm: Nonlabored breathing room air. Clear. CV: Regular rate and rhythm. No murmur, rub, or gallop. No JVD, 1+ pitting dependent edema. GI: Abdomen soft, tender in epigastrium with guarding, no rebound, non-distended, with normoactive bowel sounds.  Ext: Warm, no deformities Skin: No rashes, lesions or ulcers on visualized skin.  Neuro: Alert and oriented. No focal neurological deficits. Psych: Judgement and insight appear fair. Mood euthymic & affect congruent. Behavior is appropriate.    Data Reviewed: I have personally reviewed following labs and imaging studies  CBC: Recent Labs  Lab 07/29/18 0628 07/29/18 2135 07/30/18 0551 07/31/18 0447 08/01/18 0658 08/02/18 0520  WBC 8.2 6.8 6.6 7.4 6.6 8.7  NEUTROABS 6.5  --  4.8 5.7 5.1 7.3  HGB 6.7* 9.4* 9.2* 9.5* 9.3* 9.6*  HCT 22.7* 29.5* 30.0* 31.2* 30.6* 30.7*  MCV 97.4 89.7 91.2 91.0 91.1 91.4  PLT 166 177 194 220 242 096   Basic Metabolic Panel: Recent Labs  Lab 07/29/18 0628 07/30/18 0551 07/31/18 0447 08/01/18 0658 08/02/18 0520  NA 134* 134* 133* 133* 134*  K 3.4* 3.1* 3.3* 3.2* 3.1*  CL 101 101 101 102 101  CO2 26 24 23 22 22   GLUCOSE 116* 101* 137* 108* 146*  BUN 31*  41* 48* 59* 63*  CREATININE 2.17* 2.35* 2.72* 2.83* 2.92*  CALCIUM 7.8* 8.2* 8.2* 8.2* 8.4*  PHOS 3.0 4.2 4.5 5.8* 6.0*   GFR: Estimated Creatinine Clearance: 18 mL/min (A) (by C-G formula based on SCr of 2.92 mg/dL (H)). Liver Function Tests: Recent Labs  Lab 07/29/18 0628 07/30/18 0551 07/31/18 0447 08/01/18 0658 08/02/18 0520  AST  --   --   --  15  --   ALT  --   --   --  7  --   ALKPHOS  --   --   --  40  --   BILITOT  --   --   --  0.8  --   PROT  --   --   --  5.0*  --   ALBUMIN 2.2* 2.3* 2.4* 2.5*  2.5* 2.6*   Recent Labs  Lab 08/01/18 0658  LIPASE 301*   No results for input(s): AMMONIA in the last 168 hours. Coagulation Profile: No results for input(s): INR, PROTIME in the last 168 hours. Cardiac Enzymes: No results for input(s): CKTOTAL, CKMB, CKMBINDEX, TROPONINI in the last 168 hours. BNP (last 3 results) No results for input(s): PROBNP in the last 8760 hours. HbA1C: No  results for input(s): HGBA1C in the last 72 hours. CBG: Recent Labs  Lab 08/01/18 1112 08/01/18 1639 08/01/18 2036 08/02/18 0740 08/02/18 1144  GLUCAP 177* 138* 138* 110* 153*   Lipid Profile: No results for input(s): CHOL, HDL, LDLCALC, TRIG, CHOLHDL, LDLDIRECT in the last 72 hours. Thyroid Function Tests: No results for input(s): TSH, T4TOTAL, FREET4, T3FREE, THYROIDAB in the last 72 hours. Anemia Panel: No results for input(s): VITAMINB12, FOLATE, FERRITIN, TIBC, IRON, RETICCTPCT in the last 72 hours. Urine analysis:    Component Value Date/Time   COLORURINE YELLOW 07/25/2018 0530   APPEARANCEUR CLEAR 07/25/2018 0530   LABSPEC 1.008 07/25/2018 0530   PHURINE 5.0 07/25/2018 0530   GLUCOSEU NEGATIVE 07/25/2018 0530   HGBUR NEGATIVE 07/25/2018 0530   BILIRUBINUR NEGATIVE 07/25/2018 0530   KETONESUR NEGATIVE 07/25/2018 0530   PROTEINUR NEGATIVE 07/25/2018 0530   UROBILINOGEN 0.2 03/04/2011 2107   NITRITE NEGATIVE 07/25/2018 0530   LEUKOCYTESUR TRACE (A) 07/25/2018 0530    No results found for this or any previous visit (from the past 240 hour(s)).    Radiology Studies: Ct Abdomen Pelvis Wo Contrast  Result Date: 08/01/2018 CLINICAL DATA:  67 year old female with recurrent pancreatitis. History of pseudocyst. Abdominal pain and tenderness. Post cholecystectomy 2 and half months ago. Subsequent encounter. EXAM: CT ABDOMEN AND PELVIS WITHOUT CONTRAST TECHNIQUE: Multidetector CT imaging of the abdomen and pelvis was performed following the standard protocol without IV contrast. COMPARISON:  07/12/2018 hepatobiliary study. 07/10/2018 CT and MR. 05/28/2018 CT FINDINGS: Lower chest: Interval clearing of previously noted left lower lobe consolidation. Septal thickening and small pleural effusions greater on right may reflect changes of mild fluid overload given the diffuse third spacing of fluid. Heart incompletely assessed. Coronary artery calcifications noted. Hepatobiliary: Enlarged liver spanning over 19.6 cm. Taking into account limitation by non contrast imaging, no focal worrisome hepatic lesion. Post cholecystectomy. 6 mm stone in gallbladder stump. Perihepatic fluid. Prior nuclear medicine study did not reveal postoperative bile leak. Pancreas: Slightly hyperdense complex process spanning over 8 cm centered at the pancreatic head and duodenal sweep has not improved. This is causing significant narrowing and thickening of the duodenum. Spleen: Taking into account limitation by non contrast imaging, no splenic mass or enlargement. Adrenals/Urinary Tract: Bilateral nonobstructing renal calculi/vascular calcifications. Slightly lobulated contour of the kidneys. No obvious mass noted on this unenhanced exam. No adrenal lesion. Noncontrast filled imaging of the urinary bladder unremarkable. Stomach/Bowel: Under distended stomach. Small hiatal hernia. Markedly abnormal appearance of the duodenum as noted above. Postsurgical changes rectosigmoid region. Hepatic flexure of colon  surrounded by fluid but does not appear to be the primary source. Suspect prior appendectomy. Vascular/Lymphatic: Atherosclerotic changes aorta. Post endoluminal stenting. Native aortic aneurysm measures up to 4.9 cm without change. Renal artery stent. Reactive lymph nodes upper abdomen. Reproductive: Post hysterectomy.  No worrisome adnexal mass. Other: Prominent third spacing of fluid. Complex fluid collection adjacent to the lateral border of the left psoas muscle without change. This measures up to 5 cm. Musculoskeletal: Scoliosis with superimposed degenerative changes without acute osseous abnormality. IMPRESSION: 1. Slightly hyperdense complex process spanning over 8 cm centered at the pancreatic head and duodenal sweep has not improved. This is causing significant narrowing and thickening of the duodenum. It is possible this is a result of hemorrhagic pancreatitis given the hyperdense appearance. This does not have typical appearance of a pancreatic pseudocyst. Hematoma, infection or biloma are other considerations. 2. Interval clearing of previously noted left lower lobe consolidation. Septal thickening and small  pleural effusions greater on right may reflect changes of mild fluid overload given the diffuse third spacing of fluid. 3. Post cholecystectomy. 6 mm stone in gallbladder stump. 4. Atherosclerotic changes aorta. Post endoluminal stenting. Native aortic aneurysm measures up to 4.9 cm without change. 5. Complex fluid collection adjacent to the lateral border of the left psoas muscle without change. This measures up to 5 cm. Electronically Signed   By: Genia Del M.D.   On: 08/01/2018 17:34    Scheduled Meds: . allopurinol  100 mg Oral Daily  . amLODipine  10 mg Oral Daily  . atorvastatin  40 mg Oral q1800  . carvedilol  12.5 mg Oral BID WC  . Chlorhexidine Gluconate Cloth  6 each Topical Q0600  . clopidogrel  75 mg Oral Q breakfast  . [START ON 08/05/2018] darbepoetin (ARANESP) injection  - DIALYSIS  60 mcg Intravenous Q Sat-HD  . feeding supplement  1 Container Oral TID WC & HS  . feeding supplement (PRO-STAT SUGAR FREE 64)  30 mL Oral TID  . gabapentin  300 mg Oral Daily  . heparin  5,000 Units Subcutaneous Q8H  . hydrALAZINE  75 mg Oral Q8H  . lipase/protease/amylase  12,000 Units Oral TID WC  . mometasone-formoterol  2 puff Inhalation BID  . multivitamin  1 tablet Oral QHS  . [START ON 08/03/2018] pantoprazole  40 mg Oral Daily  . sodium chloride flush  3 mL Intravenous Q12H   Continuous Infusions:   LOS: 7 days   Time spent: 25 minutes.  Patrecia Pour, MD [facility] [email or web address] Password [facility] 08/02/2018, 4:31 PM

## 2018-08-02 NOTE — Progress Notes (Signed)
Physical Therapy Treatment Patient Details Name: [name] MRN: [id number] DOB: 08/09/1951 Today's Date: 08/02/2018    History of Present Illness Pt is a 67 y.o. female admitted to [facility] 07/24/18 with lethargy and AMS; transferred to [facility]. Worked up for AKI on CKD IV. Altered level of consciousness attributed to likely polypharmacy. HD initiated 11/21. PMH includes HTN, CKD IV-V, neuropathy, CAD (s/p CABG), PVD, chronic back pain, COPD. Of note, recent admission from [facility] to [facility] 11/2-11/13 with RUQ pain.; x-ray L foot shows suspectected nondisplaced fracture of the distal metaphysis promximal phalanx small toe.     PT Comments    Patient is progressing very well towards their physical therapy goals. Increased ambulation distance to 400 feet using Rollator and supervision; required one seated rest break. Continues with decreased gait speed and activity tolerance. POC remains appropriate.     Follow Up Recommendations  Home health PT;Supervision for mobility/OOB     Equipment Recommendations  None recommended by PT    Recommendations for Other Services       Precautions / Restrictions Precautions Precautions: Fall Required Braces or Orthoses: Other Brace/Splint Other Brace/Splint: left post op shoe Restrictions Weight Bearing Restrictions: No    Mobility  Bed Mobility Overal bed mobility: Modified Independent             General bed mobility comments: OOB in bathroom upon arrival  Transfers Overall transfer level: Needs assistance Equipment used: Rolling walker (2 wheeled) Transfers: Sit to/from Stand Sit to Stand: Supervision         General transfer comment: supervision for safety for stand from toilet and rollator. cues for rollator management  Ambulation/Gait Ambulation/Gait assistance: Supervision Gait Distance (Feet): 400 Feet Assistive device: 4-wheeled walker Gait Pattern/deviations: Step-through pattern;Decreased stride length Gait velocity:  Decreased Gait velocity interpretation: <1.8 ft/sec, indicate of risk for recurrent falls General Gait Details: Pt with slow cadence but no overt LOB. One episode of drifting towards right with distraction but pt able to correct without physical assistance. Requiring one seated rest break. O2 97% on RA   Stairs             Wheelchair Mobility    Modified Rankin (Stroke Patients Only)       Balance Overall balance assessment: Needs assistance Sitting-balance support: Feet supported Sitting balance-Leahy Scale: Good     Standing balance support: No upper extremity supported Standing balance-Leahy Scale: Good Standing balance comment: Able to perform static standing at sink with supervision                            Cognition Arousal/Alertness: Awake/alert Behavior During Therapy: WFL for tasks assessed/performed Overall Cognitive Status: No family/caregiver present to determine baseline cognitive functioning                                        Exercises      General Comments General comments (skin integrity, edema, etc.): Pt with increased spirits and mood, joking appropriately with therapist      Pertinent Vitals/Pain Pain Assessment: Faces Pain Score: 6  Faces Pain Scale: No hurt Pain Location: stomach Pain Descriptors / Indicators: Aching Pain Intervention(s): Repositioned    Home Living                      Prior Function  PT Goals (current goals can now be found in the care plan section) Acute Rehab PT Goals Patient Stated Goal: "Go home in the next couple days." PT Goal Formulation: With patient/family Time For Goal Achievement: 08/09/18 Potential to Achieve Goals: Good Progress towards PT goals: Progressing toward goals    Frequency    Min 3X/week      PT Plan Current plan remains appropriate    Co-evaluation              AM-PAC PT "6 Clicks" Mobility   Outcome Measure  Help  needed turning from your back to your side while in a flat bed without using bedrails?: None Help needed moving from lying on your back to sitting on the side of a flat bed without using bedrails?: None Help needed moving to and from a bed to a chair (including a wheelchair)?: None Help needed standing up from a chair using your arms (e.g., wheelchair or bedside chair)?: None Help needed to walk in hospital room?: None Help needed climbing 3-5 steps with a railing? : A Little 6 Click Score: 23    End of Session   Activity Tolerance: Patient tolerated treatment well Patient left: in bed;with call bell/phone within reach Nurse Communication: Mobility status PT Visit Diagnosis: Muscle weakness (generalized) (M62.81);Other abnormalities of gait and mobility (R26.89)     Time: 1314-1330 PT Time Calculation (min) (ACUTE ONLY): 16 min  Charges:  $Therapeutic Activity: 8-22 mins                    Ellamae Sia, PT, DPT Acute Rehabilitation Services Pager [phone] Office [phone]   Willy Eddy 08/02/2018, 1:42 PM

## 2018-08-02 NOTE — Consult Note (Signed)
[facility] Consult: 10:05 AM 08/02/2018  LOS: 7 days    Referring Provider: Dr Bonner Puna  Primary Care Physician:  Bernerd Limbo, MD Primary Gastroenterologist:  Dr. [name]     Reason for Consultation: Abdominal pain   [name] is a 67 y.o. female.  Innumerable medical issues.  Type 2 DM, on no meds for this.  CAD, s/p stents.    Chronic Plavix CHFd.  PAF, not on AC.  PVD.  S/p right fem pop, CEA, left subclavian stent.   S/p AAA stent graft repair 01/1459, complicated by large RP hematoma.  Mitral valve repair.  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  Nocturnal oxygen.   Last colonoscopy 2013 [place] by Dr. Dorrene German.  Unable to pass pediatric scope led to virtual colonoscopy,  followed by partial colectomy for diverticulitis.  S/p appy for ruptured appendix 2016.   04/12/18 EGD for transfusion requiring ABL and anemia of chronic disease, maroon stools. Melanosis in D2/D3 likely due to oral iron.  O/w normal study.   Acute pancreatitis during 04/2018 admission, presumed biliary in origin, normal CBD and normal LFTs however.  Ruled out for autoimmune pancreatitis.  Was incidentally discovered on a CT scan ordered to follow history of retroperitoneal hematoma.   04/19/2018 laparoscopic cholecystectomy without intraoperative cholangiogram.  Pathology consistent with chronic cholecystitis.  Readmitted 05/28/2018 - 10/ 21/2019 with recurrent versus smoldering pancreatitis. 05/28/2018 noncontrast CT abdomen pelvis: CBD 7 mm.  Progressive, severe, pancreatitis involving the head and neck with inflammation of the descending duodenum and hepatic flexure.  Enlarging, complex, poorly defined fluid collection in the pancreatic head and a new fluid collection in the gallbladder fossa.  Radiologist  felt that this represented worsening pancreatitis and enlarging pancreatic pseudocyst with small peri-hepatic ascites.   C. difficile negative diarrhea improved with Creon.  Required core tract feeding tube.  06/16/18 EGD for Hematemesis.  2, nonbleeding, esophageal ulcers likely due to coretrack feeding tube. Clotted blood in the gastric cardia, fundus and body limiting full evaluation. Clotted blood at the tip of the Dobbhoff was removed endoscopically and the tube was removed.   Ultimately tolerated reinsertion of FT beyond lig of Treitz.    Her pancreatitis and pseudocyst were/are hard to follow as advanced kidney disease prevents administration of IV contrast.  Readmitted 07/08/2018 -07/19/2018 with malnutrition, decubitus ulcer, HAP. Abdominal ultrasound showed 7.2 x 3.2 x 3.2 complex fluid collection in the gallbladder fossa, ?  Postop hematoma. MRI/MRCP 07/10/2018: Dilated cystic duct stump containing 5 mm stone.  No evidence of biliary ductal dilatation or choledocholithiasis.  Some decrease in size of the fluid collection located between the descending duodenum and pancreatic head, ? biloma vs pseudocyst vs abscess?  This was associated with surrounding inflammatory changes.   Decreased thickening at the hepatic flexure.  Stable aortic stent graft.  Hemosiderosis.   07/10/2018 noncontrast CT abdomen pelvis: New LLL PNA.  Ill-defined, 4 to 5 cm, heterogeneous structure at the pancreatic head/descending duodenum, question postop changes versus hematoma versus biloma.  Less likely representing abscess.  Diffuse subcutaneous edema. 07/12/2018 HIDA scan.  No evidence of bile leak or obstruction, patent CBD.  Prominent cystic duct remnant.   Dr. Ardis Hughs noted the stone in the cystic duct remnant was not amenable to endoscopic therapy and was not convinced it was causing her ongoing abdominal pain and nausea.  The fluid collection was not amenable to IR access.  Overall he felt optimizing her nutrition,  increasing mobility and pain control would benefit the patient. 06/16/18 EGD: for hematemesis.  2, nonbleeding, esophageal ulcers likely due to coretrack feeding tube. Clotted blood in the gastric cardia, fundus and body limiting full evaluation. Clotted blood at the tip of the Dobbhoff was removed endoscopically and the tube was removed.  Pt canceled the 07/24/18 follow-up with Dr. Rush Landmark because she was inpt. It has been rescheduled for 08/09/2018.  Readmitted 11/18 with somnolence due to long and short acting narcotics in the setting of severe renal dysfunction, left foot pain.  She had only been discharged from the nursing home for about a week when she was readmitted.   She has now been started on hemodialysis for progression to what may be permanent ESRD with oliguria.  Also has acute on chronic diastolic heart failure and treated for acute hypoxic respiratory failure in the setting of oversedation.  Abdominal pain started worsening a couple of days ago.  It is located in the right upper quadrant and does not radiate into her back or chest.  She says that for 2 or 3 weeks she had been pain-free.  However she had been receiving scheduled narcotics at home.  Generally her appetite was not great but she has been tolerating solid food until she vomited clear material yesterday and her diet has been switched to clear liquids.  Watery stools 2 days ago but none yesterday and none today.  Has not been receiving pancreatic enzyme replacement. Lipase 305 on 11/2             124 on 11/4              301 on 11/26  Other than low albumin, her LFTs are normal. 08/01/2018  noncontrast CT abdomen pelvis: No improvement in the 8 cm, slightly hyperdense, complex in the region of the pancreatic head and duodenal sweep.  Given  hyperdense appearance it may represent hemorrhagic pancreatitis, appearance less typical of pancreatic pseudocyst. This is causing luminal narrowing and thickening of duodenum.   6 mm  stone in gallbladder stump.  Liver enlarged.  Interval improvement of LLL pneumonia/consolidation.   No change in the 4.9 cm native aortic aneurysm.  S/p endoluminal aortic stenting.  Complex fluid collection adjacent to the left psoas muscle unchanged.  She received 2 units PRBCs on 11/23 for Hgb 0.7, MCV 97.4. Hgb today is 9.6.   X-ray confirms a fracture in her left small toe.       Past Medical History:  Diagnosis Date  . AAA (abdominal aortic aneurysm) ([place]) 5/09   3.7x3.3 by u/s 2009  . Abnormal EKG    deep TW inversions chronic  . Anemia   . CAD (coronary artery disease)    a. CABG 03/2011 LIMA to LAD, SVG to OM, SVG to PDA. b. cath 06/25/2015 DES to SVG to rPDA, patent LIMA to LAD, patent SVG to OM  . carotid stenosis 5/09   S/p L CEA ;  60-79% bilat ICA by preCABG dopplers 7/12  . CHF (congestive heart failure) ([place])   . Chronic back pain    "  all over" (06/24/2015)  . Chronic bronchitis ([place])    "get it pretty much q yr" (06/24/2015)  . CKD (chronic kidney disease)   . Complication of anesthesia 1966   "problem w/ether"  . COPD (chronic obstructive pulmonary disease) ([place])   . Depression   . GERD (gastroesophageal reflux disease)    With hiatal hernia  . GIB (gastrointestinal bleeding) 9/11   S/p EGD with cautery at [facility]  . Heart murmur   . History of blood transfusion "a few times"   "all related to anemia" (06/24/2015)  . History of hiatal hernia   . Hyperlipidemia   . Hypertension   . Mitral regurgitation    3+ MR by intraoperative TEE;  s/p MV repair with Dr. Roxy Manns 7/12  . Myocardial infarction [facility]) 2012 "several"  . On home oxygen therapy    "2 liters at night; negative for sleep apnea"  . PAD (peripheral artery disease) ([facility])    Severe; s/p bilateral renal artery stents, moderate in-stent restenosis  . Pancreatitis 05/2018  . Paroxysmal atrial fibrillation ([facility])    coumadin;  echo 9/07: EF 60%, mild LVH;  s/p Cox Maze 7/12 with LAA clipping  . Subclavian  artery stenosis, left ([facility])    stented by Dr. Trula Slade on 10/18 to help flow of her LIMA to LAD  . Type II diabetes mellitus ([place])     Past Surgical History:  Procedure Laterality Date  . A/V FISTULAGRAM Left 03/21/2018   Procedure: A/V FISTULAGRAM;  Surgeon: Serafina Mitchell, MD;  Location: [place] [facility];  Service: Cardiovascular;  Laterality: Left;  . ABDOMINAL AORTIC ENDOVASCULAR STENT GRAFT N/A 01/17/2018   Procedure: ABDOMINAL AORTIC ENDOVASCULAR STENT GRAFT WITH CO2;  Surgeon: Serafina Mitchell, MD;  Location: [place];  Service: Vascular;  Laterality: N/A;  . ABDOMINAL HYSTERECTOMY  1990's  . APPENDECTOMY  Aug. 11, 2016   Ruptured  . AV FISTULA PLACEMENT Left 11/03/2017   Procedure: ARTERIOVENOUS (AV) FISTULA CREATION LEFT ARM;  Surgeon: Serafina Mitchell, MD;  Location: [place];  Service: Vascular;  Laterality: Left;  . BOWEL RESECTION  2013  . CARDIAC CATHETERIZATION N/A 06/24/2015   Procedure: Left Heart Cath and Cors/Grafts Angiography;  Surgeon: Leonie Man, MD;  Location: [place] [facility];  Service: Cardiovascular;  Laterality: N/A;  . CARDIAC CATHETERIZATION N/A 06/25/2015   Procedure: Coronary Stent Intervention;  Surgeon: Sherren Mocha, MD;  Location: [place] [facility];  Service: Cardiovascular;  Laterality: N/A;  . CAROTID ENDARTERECTOMY Left   . CATARACT EXTRACTION Right 03/27/2018  . CHOLECYSTECTOMY N/A 04/19/2018   Procedure: LAPAROSCOPIC CHOLECYSTECTOMY WITH INTRAOPERATIVE CHOLANGIOGRAM;  Surgeon: Rolm Bookbinder, MD;  Location: [place];  Service: General;  Laterality: N/A;  . CORONARY ANGIOPLASTY    . CORONARY ARTERY BYPASS GRAFT  03/05/2011   CABG X3 (LIMA to LAD, SVG to OM, SVG to PDA, EVH via left thigh  . CORONARY STENT PLACEMENT    . ESOPHAGOGASTRODUODENOSCOPY N/A 04/12/2018   Procedure: ESOPHAGOGASTRODUODENOSCOPY (EGD);  Surgeon: [name], MD;  Location: [facility] ENDOSCOPY;  Service: Endoscopy;  Laterality: N/A;  . ESOPHAGOGASTRODUODENOSCOPY N/A 06/16/2018    Procedure: ESOPHAGOGASTRODUODENOSCOPY (EGD);  Surgeon: Thornton Park, MD;  Location: [place];  Service: Gastroenterology;  Laterality: N/A;  . LACERATION REPAIR Right 1990's   WRIST  . MAZE  03/05/2011   complete biatrial lesion set with clipping of LA appendage  . MITRAL VALVE REPAIR  03/05/2011   55m Memo 3D ring annuloplasty for ischemic MR  . PERIPHERAL VASCULAR  BALLOON ANGIOPLASTY Left 03/21/2018   Procedure: PERIPHERAL VASCULAR BALLOON ANGIOPLASTY;  Surgeon: Serafina Mitchell, MD;  Location: [place] [facility];  Service: Cardiovascular;  Laterality: Left;  . PERIPHERAL VASCULAR CATHETERIZATION N/A 06/24/2015   Procedure: Aortic Arch Angiography;  Surgeon: Serafina Mitchell, MD;  Location: [place] [facility];  Service: Cardiovascular;  Laterality: N/A;  . PERIPHERAL VASCULAR CATHETERIZATION  06/24/2015   Procedure: Peripheral Vascular Intervention;  Surgeon: Serafina Mitchell, MD;  Location: [place] [facility];  Service: Cardiovascular;;  . PERIPHERAL VASCULAR CATHETERIZATION N/A 06/24/2015   Procedure: Abdominal Aortogram;  Surgeon: Serafina Mitchell, MD;  Location: [place] [facility];  Service: Cardiovascular;  Laterality: N/A;  . RENAL ANGIOGRAM Bilateral 09/11/2013   Procedure: RENAL ANGIOGRAM;  Surgeon: Serafina Mitchell, MD;  Location: [facility] CATH LAB;  Service: Cardiovascular;  Laterality: Bilateral;  . RIGHT FEMORAL-POPLITEAL BYPASS      Prior to Admission medications   Medication Sig Start Date End Date Taking? Authorizing Provider  acetaminophen (TYLENOL) 325 MG tablet Take 325 mg by mouth every 6 (six) hours as needed (for pain).   Yes [provider]  allopurinol (ZYLOPRIM) 100 MG tablet Take 100 mg by mouth daily. 02/02/17  Yes [provider]  amLODipine (NORVASC) 10 MG tablet Take 10 mg by mouth daily.     Yes [provider]  atorvastatin (LIPITOR) 40 MG tablet Take 40 mg by mouth daily.  12/07/17  Yes [provider]  carvedilol (COREG) 12.5  MG tablet Take 1 tablet (12.5 mg total) by mouth 2 (two) times daily with a meal. 06/26/18  Yes Thurnell Lose, MD  Cholecalciferol (VITAMIN D3) 5000 units TABS Take 5,000 Units by mouth daily.   Yes [provider]  clonazePAM (KLONOPIN) 0.5 MG tablet Take 0.5 tablets (0.25 mg total) by mouth at bedtime as needed for anxiety (for insomnia). 07/19/18 07/19/19 Yes Florencia Reasons, MD  clopidogrel (PLAVIX) 75 MG tablet Take 1 tablet (75 mg total) by mouth daily with breakfast. 06/26/15  Yes Meng, [place], PA  Cyanocobalamin (VITAMIN B-12) 5000 MCG SUBL Place 5,000 mcg under the tongue daily.   Yes [provider]  DULoxetine (CYMBALTA) 60 MG capsule Take 60 mg by mouth daily.  10/26/17  Yes [provider]  feeding supplement (BOOST / RESOURCE BREEZE) LIQD Take 1 Container by mouth 2 (two) times daily.   Yes [provider]  fenofibrate 160 MG tablet Take 1 tablet (160 mg total) by mouth daily. 10/08/15  Yes Delfina Redwood, MD  ferrous sulfate 325 (65 FE) MG tablet Take 1 tablet (325 mg total) by mouth daily with breakfast. 07/19/18  Yes Florencia Reasons, MD  furosemide (LASIX) 40 MG tablet Take 1 tablet (40 mg total) by mouth 2 (two) times daily. Patient taking differently: Take 40 mg by mouth daily.  06/26/18  Yes Thurnell Lose, MD  gabapentin (NEURONTIN) 300 MG capsule TAKE 1 CAPSULE(300 MG) BY MOUTH TWICE DAILY Patient taking differently: Take 300 mg by mouth 2 (two) times daily.  05/05/18  Yes Serafina Mitchell, MD  hydrALAZINE (APRESOLINE) 25 MG tablet Take 1 tablet (25 mg total) by mouth every 8 (eight) hours. 06/26/18  Yes Thurnell Lose, MD  loperamide (IMODIUM A-D) 2 MG tablet Take 2 mg by mouth 4 (four) times daily as needed for diarrhea or loose stools.   Yes [provider]  MAGNESIUM PO Take 1 tablet by mouth daily.   Yes [provider]  oxyCODONE (  OXYCONTIN) 10 mg 12 hr tablet Take 1 tablet (10 mg total) by mouth every 12 (twelve) hours.  07/19/18  Yes Florencia Reasons, MD  oxyCODONE 10 MG TABS Take 1 tablet (10 mg total) by mouth every 4 (four) hours as needed for moderate pain or severe pain. 07/19/18  Yes Florencia Reasons, MD  pantoprazole (PROTONIX) 40 MG tablet Take 1 tablet (40 mg total) by mouth 2 (two) times daily. 06/26/18  Yes Thurnell Lose, MD  POTASSIUM PO Take 1 tablet by mouth daily.   Yes [provider]  vitamin B-12 100 MCG tablet Take 1 tablet (100 mcg total) by mouth daily. 06/26/18  Yes Thurnell Lose, MD  albuterol (PROVENTIL HFA;VENTOLIN HFA) 108 (90 Base) MCG/ACT inhaler Inhale 2 puffs into the lungs every 4 (four) hours as needed for wheezing or shortness of breath. ProAir Patient not taking: Reported on 07/24/2018 10/08/15   Delfina Redwood, MD  Amino Acids-Protein Hydrolys (FEEDING SUPPLEMENT, PRO-STAT SUGAR FREE 64,) LIQD Take 30 mLs by mouth 3 (three) times daily with meals. Patient not taking: Reported on 07/24/2018 06/26/18   Thurnell Lose, MD  budesonide-formoterol [facility]) 160-4.5 MCG/ACT inhaler Inhale 2 puffs into the lungs 2 (two) times daily as needed (for flares).    [provider]  HYDROcodone-acetaminophen (NORCO/VICODIN) 5-325 MG tablet Take 1 tablet by mouth every 6 (six) hours as needed for moderate pain. Patient not taking: Reported on 07/24/2018 06/26/18   Thurnell Lose, MD  isosorbide mononitrate (IMDUR) 30 MG 24 hr tablet Take 0.5 tablets (15 mg total) by mouth daily. Patient not taking: Reported on 07/24/2018 12/02/15   Sherren Mocha, MD  [facility] 4 MG/0.1ML LIQD nasal spray kit Place 1 spray into the nose once.  07/21/18   [provider]  polyethylene glycol (MIRALAX / GLYCOLAX) packet Take 17 g by mouth daily as needed for mild constipation. 06/26/18   Thurnell Lose, MD  spironolactone (ALDACTONE) 25 MG tablet Take 1 tablet (25 mg total) by mouth daily. Patient not taking: Reported on 07/24/2018 06/26/18   Thurnell Lose, MD  XTAMPZA ER 9 MG C12A  Take 9 mg by mouth daily.  07/21/18   [provider]    Scheduled Meds: . allopurinol  100 mg Oral Daily  . amLODipine  10 mg Oral Daily  . atorvastatin  40 mg Oral q1800  . carvedilol  12.5 mg Oral BID WC  . Chlorhexidine Gluconate Cloth  6 each Topical Q0600  . clopidogrel  75 mg Oral Q breakfast  . [START ON 08/05/2018] darbepoetin (ARANESP) injection - DIALYSIS  60 mcg Intravenous Q Sat-HD  . feeding supplement  1 Container Oral TID BM  . feeding supplement (PRO-STAT SUGAR FREE 64)  30 mL Oral TID  . gabapentin  300 mg Oral Daily  . heparin  5,000 Units Subcutaneous Q8H  . hydrALAZINE  75 mg Oral Q8H  . mometasone-formoterol  2 puff Inhalation BID  . multivitamin  1 tablet Oral QHS  . pantoprazole  40 mg Oral BID  . sodium chloride flush  3 mL Intravenous Q12H   Infusions: . potassium chloride 10 mEq (08/02/18 1003)   PRN Meds: acetaminophen **OR** acetaminophen, calcium carbonate, HYDROmorphone (DILAUDID) injection, naLOXone (NARCAN)  injection, ondansetron **OR** ondansetron (ZOFRAN) IV, oxyCODONE   Allergies as of 07/24/2018 - Review Complete 07/24/2018  Allergen Reaction Noted  . Isosorbide Other (See Comments) 10/28/2015  . Isosorbide nitrate Other (See Comments) 10/28/2015  . Oxycodone-acetaminophen Itching 10/28/2015  .  Codeine Itching 10/28/2015  . Contrast media [iodinated diagnostic agents] Itching and Other (See Comments) 05/29/2012  . Ioxaglate Itching and Other (See Comments) 02/24/2015  . Metrizamide Itching and Other (See Comments) 07/03/2014  . Percocet [oxycodone-acetaminophen] Itching and Other (See Comments) 09/04/2013    Family History  Problem Relation Age of Onset  . Emphysema Mother   . Heart disease Mother        before age 51  . Hypertension Mother   . Hyperlipidemia Mother   . Heart attack Mother   . AAA (abdominal aortic aneurysm) Mother        rupture  . Heart attack Father 9  . Emphysema Father   . Heart disease Father          before age 55  . Hyperlipidemia Father   . Hypertension Father   . Peripheral vascular disease Father   . Diabetes Brother   . Heart disease Brother        before age 69  . Hyperlipidemia Brother   . Hypertension Brother   . Heart attack Brother        CABG  . Heart disease Other        Vascular disease in grandparents, uncles and dad  . Colon cancer Neg Hx     Social History   Socioeconomic History  . Marital status: Widowed    Spouse name: Not on file  . Number of children: Not on file  . Years of education: Not on file  . Highest education level: Not on file  Occupational History  . Occupation: Scientist, water quality in past    Employer: DISABLED  Social Needs  . Financial resource strain: Not on file  . Food insecurity:    Worry: Not on file    Inability: Not on file  . Transportation needs:    Medical: Not on file    Non-medical: Not on file  Tobacco Use  . Smoking status: Current Every Day Smoker    Years: 50.00    Types: Cigarettes  . Smokeless tobacco: Never Used  . Tobacco comment: 3/4 pk per day  Substance and Sexual Activity  . Alcohol use: Yes    Alcohol/week: 0.0 standard drinks    Comment: occasionally  . Drug use: Yes    Types: Marijuana  . Sexual activity: Not Currently    Birth control/protection: None  Lifestyle  . Physical activity:    Days per week: Not on file    Minutes per session: Not on file  . Stress: Not on file  Relationships  . Social connections:    Talks on phone: Not on file    Gets together: Not on file    Attends religious service: Not on file    Active member of club or organization: Not on file    Attends meetings of clubs or organizations: Not on file    Relationship status: Not on file  . Intimate partner violence:    Fear of current or ex partner: Not on file    Emotionally abused: Not on file    Physically abused: Not on file    Forced sexual activity: Not on file  Other Topics Concern  . Not on file  Social History  Narrative   Widowed in 2009.    REVIEW OF SYSTEMS: Constitutional: Able to ambulate with a walker when she left the nursing facility a few weeks ago. ENT:  No nose bleeds Pulm: No new dyspnea.  No cough.  Smokes about a  pack of cigarettes a day. CV:  No palpitations, no chest pain.  Chronic lower leg edema. GU:  No hematuria, no frequency GI:  Per HPI Heme: Denies excessive or unusual bleeding or bruising.  She does have bruises on her lower abdomen from Lovenox injections. Transfusions:  Per HPI Neuro:  No headaches, no peripheral tingling or numbness Derm:  No itching, no rash or sores.  Endocrine:  No sweats or chills.  No polyuria or dysuria Immunization: Flu shot 3 weeks ago. Travel:  None beyond local counties in last few months.    PHYSICAL EXAM: Vital signs in last 24 hours: Vitals:   08/02/18 0416 08/02/18 0817  BP: (!) 136/42   Pulse: 66 70  Resp: 18 18  Temp: 98.8 F (37.1 C)   SpO2: 93% 97%   Wt Readings from Last 3 Encounters:  08/01/18 62.7 kg  07/08/18 61.2 kg  07/05/18 61.2 kg    General: Patient overall looks better than she did a couple of months ago but she still looks chronically ill and malnourished.  She is sitting up in bed and appears comfortable. Head: No facial asymmetry or swelling.  No signs of head trauma. Eyes: No conjunctival pallor.  No scleral icterus.  EOMI. Ears: Not hard of hearing Nose: No discharge Mouth: Pink, moist, clear oral mucosa. Neck: JVD, no masses, no thyromegaly. Lungs: Clear bilaterally.  No cough or dyspnea. Heart: RRR.  No MRG.  S1, S2 present Abdomen: Soft.  Mild tenderness diffusely.  Palpation in the left abdomen triggers discomfort in the right upper quadrant.  No guarding or rebound.  Bowel sounds normal, active.  Injection site hematomas visible and palpable in the lower abdomen Rectal: Deferred Musc/Skeltl: No joint erythema or gross deformity.  No contractures. Extremities: 1-2+ pitting edema in both feet and  lower legs. Neurologic: Alert.  Oriented x3.  No tremors.  No gross weakness or deficits. Skin: Overall pallor.  No suspicious sores or rashes. Tattoos: None observed   Psych: Overall flat affect, mildly depressed.  Fluid speech.  Overall more engaged in conversation than in previous months.  Intake/Output from previous day: 11/26 0701 - 11/27 0700 In: 363 [P.O.:360; I.V.:3] Out: 1500 [Urine:1400; Emesis/NG output:100] Intake/Output this shift: Total I/O In: 360 [P.O.:360] Out: 100 [Urine:100]  LAB RESULTS: Recent Labs    07/31/18 0447 08/01/18 0658 08/02/18 0520  WBC 7.4 6.6 8.7  HGB 9.5* 9.3* 9.6*  HCT 31.2* 30.6* 30.7*  PLT 220 242 269   BMET Lab Results  Component Value Date   NA 134 (L) 08/02/2018   NA 133 (L) 08/01/2018   NA 133 (L) 07/31/2018   K 3.1 (L) 08/02/2018   K 3.2 (L) 08/01/2018   K 3.3 (L) 07/31/2018   CL 101 08/02/2018   CL 102 08/01/2018   CL 101 07/31/2018   CO2 22 08/02/2018   CO2 22 08/01/2018   CO2 23 07/31/2018   GLUCOSE 146 (H) 08/02/2018   GLUCOSE 108 (H) 08/01/2018   GLUCOSE 137 (H) 07/31/2018   BUN 63 (H) 08/02/2018   BUN 59 (H) 08/01/2018   BUN 48 (H) 07/31/2018   CREATININE 2.92 (H) 08/02/2018   CREATININE 2.83 (H) 08/01/2018   CREATININE 2.72 (H) 07/31/2018   CALCIUM 8.4 (L) 08/02/2018   CALCIUM 8.2 (L) 08/01/2018   CALCIUM 8.2 (L) 07/31/2018   LFT Recent Labs    07/31/18 0447 08/01/18 0658 08/02/18 0520  PROT  --  5.0*  --   ALBUMIN 2.4* 2.5*  2.5* 2.6*  AST  --  15  --   ALT  --  7  --   ALKPHOS  --  40  --   BILITOT  --  0.8  --   BILIDIR  --  0.2  --   IBILI  --  0.6  --    PT/INR Lab Results  Component Value Date   INR 1.28 06/15/2018   INR 1.13 05/28/2018   INR 1.24 04/09/2018   Hepatitis Panel No results for input(s): HEPBSAG, HCVAB, HEPAIGM, HEPBIGM in the last 72 hours. C-Diff No components found for: CDIFF Lipase     Component Value Date/Time   LIPASE 301 (H) 08/01/2018 0658    Drugs of  Abuse     Component Value Date/Time   LABOPIA POSITIVE (A) 07/25/2018 0530   COCAINSCRNUR NONE DETECTED 07/25/2018 0530   LABBENZ NONE DETECTED 07/25/2018 0530   AMPHETMU NONE DETECTED 07/25/2018 0530   THCU NONE DETECTED 07/25/2018 0530   LABBARB NONE DETECTED 07/25/2018 0530     RADIOLOGY STUDIES: Ct Abdomen Pelvis Wo Contrast  Result Date: 08/01/2018 CLINICAL DATA:  67 year old female with recurrent pancreatitis. History of pseudocyst. Abdominal pain and tenderness. Post cholecystectomy 2 and half months ago. Subsequent encounter. EXAM: CT ABDOMEN AND PELVIS WITHOUT CONTRAST TECHNIQUE: Multidetector CT imaging of the abdomen and pelvis was performed following the standard protocol without IV contrast. COMPARISON:  07/12/2018 hepatobiliary study. 07/10/2018 CT and MR. 05/28/2018 CT FINDINGS: Lower chest: Interval clearing of previously noted left lower lobe consolidation. Septal thickening and small pleural effusions greater on right may reflect changes of mild fluid overload given the diffuse third spacing of fluid. Heart incompletely assessed. Coronary artery calcifications noted. Hepatobiliary: Enlarged liver spanning over 19.6 cm. Taking into account limitation by non contrast imaging, no focal worrisome hepatic lesion. Post cholecystectomy. 6 mm stone in gallbladder stump. Perihepatic fluid. Prior nuclear medicine study did not reveal postoperative bile leak. Pancreas: Slightly hyperdense complex process spanning over 8 cm centered at the pancreatic head and duodenal sweep has not improved. This is causing significant narrowing and thickening of the duodenum. Spleen: Taking into account limitation by non contrast imaging, no splenic mass or enlargement. Adrenals/Urinary Tract: Bilateral nonobstructing renal calculi/vascular calcifications. Slightly lobulated contour of the kidneys. No obvious mass noted on this unenhanced exam. No adrenal lesion. Noncontrast filled imaging of the urinary  bladder unremarkable. Stomach/Bowel: Under distended stomach. Small hiatal hernia. Markedly abnormal appearance of the duodenum as noted above. Postsurgical changes rectosigmoid region. Hepatic flexure of colon surrounded by fluid but does not appear to be the primary source. Suspect prior appendectomy. Vascular/Lymphatic: Atherosclerotic changes aorta. Post endoluminal stenting. Native aortic aneurysm measures up to 4.9 cm without change. Renal artery stent. Reactive lymph nodes upper abdomen. Reproductive: Post hysterectomy.  No worrisome adnexal mass. Other: Prominent third spacing of fluid. Complex fluid collection adjacent to the lateral border of the left psoas muscle without change. This measures up to 5 cm. Musculoskeletal: Scoliosis with superimposed degenerative changes without acute osseous abnormality. IMPRESSION: 1. Slightly hyperdense complex process spanning over 8 cm centered at the pancreatic head and duodenal sweep has not improved. This is causing significant narrowing and thickening of the duodenum. It is possible this is a result of hemorrhagic pancreatitis given the hyperdense appearance. This does not have typical appearance of a pancreatic pseudocyst. Hematoma, infection or biloma are other considerations. 2. Interval clearing of previously noted left lower lobe consolidation. Septal thickening and small pleural effusions greater on right may reflect  changes of mild fluid overload given the diffuse third spacing of fluid. 3. Post cholecystectomy. 6 mm stone in gallbladder stump. 4. Atherosclerotic changes aorta. Post endoluminal stenting. Native aortic aneurysm measures up to 4.9 cm without change. 5. Complex fluid collection adjacent to the lateral border of the left psoas muscle without change. This measures up to 5 cm. Electronically Signed   By: Genia Del M.D.   On: 08/01/2018 17:34    IMPRESSION:   *    Complicated patient with history of acute, presumed biliary,  pancreatitis. 04/19/2018 laparoscopic cholecystectomy  Fluid collection in the region of the pancreatic head and duodenum.  ? Sequelae of hemorrhagic pancreatitis vs pseudocyst.  Cystic duct stone The worsening abdominal pain may have to do with reduced narcotics dosing in light of relative narcotics "overdose".   Receiving limited Dilaudid, oxycodone and tramadol.    *    TME, AMS from narcotics.  *    Acute renal failure.  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  Good response to 2 U PRBCs on 11/23.    *   Protein cal malnutrition.  Weight currently 62.7 kg, 68.4 kg on 06/16/18.   Diet now clears due to N/V but had been tolerating solids at home  *   Chronic Plavix.  Not on hold.    *   ASPVD.  Stent graft repair AAA 01/2018.  Status post right femoropopliteal, CEA, left subclavian stent.  Still smokes up to 1 pack of cigarettes a day.  *    2013 partial colectomy for diverticulitis associated luminal narrowing.  Status post appendectomy 2016 for ruptured appendix.    PLAN:     *  Trial full liquids, if not tolerated she may need feeding tube placement beyond area of duodenal narrowing.    *   Add creon/pancrease.  Going to drop PPI to 1 x daily given no mucosal findings on EGD in 04/2018.  Note she developed bleeding from FT associated esophageal ulcers in mid 06/2018 but subsequently tolerated FT placement post lig of treitz.     *  Dr Rush Landmark will see pt later today.     Azucena Freed  08/02/2018, 10:05 AM Phone [phone]

## 2018-08-02 NOTE — Progress Notes (Signed)
Nutrition Follow-up  DOCUMENTATION CODES:   Non-severe (moderate) malnutrition in context of chronic illness  INTERVENTION:   Increase Boost Breeze po to QID, each supplement provides 250 kcal and 9 grams of protein  Continue 30 ml Prostat TID, each supplement provides 100 kcals and 15 grams protein.   Continue Rena-vit  NUTRITION DIAGNOSIS:   Moderate Malnutrition related to chronic illness as evidenced by moderate fat depletion, severe muscle depletion, energy intake < or equal to 50% for > or equal to 1 month, percent weight loss, per patient/family report.  Being addressed as diet being advanced, taking oral nutrition supplements  GOAL:   Patient will meet greater than or equal to 90% of their needs  Progressing  MONITOR:   PO intake, Supplement acceptance, Skin, Labs, Weight trends  REASON FOR ASSESSMENT:   Consult Assessment of nutrition requirement/status  ASSESSMENT:   67 yo female, admitted with acute encephalopathy. PMH significant for AAA, acute cholecystitis s/p cholecystectomy, gallstone pancreatitis, CKD IV, COPD, CAD, DM, hypertension, NSTEMI, and pancreatitis with pseudocyst. Recent admissions to [facility] - 9/23 for never resolved pancreatitis and GIB. 11/2 US showed likely hematoma in gallbladder fossa. 11/4 MRCP retained stone in cystic stump, no choledocholithiasis, fluid in gallbladder fossa concerning for possible post-op bile leak. Required TF via Cortrak, diet advanced to Dys 2.  Last HD 11/22; pt may not require outpatient HD at this time  Diet advanced to [facility] today. Tolerated CL tray, does not think she can tolerate solid foods yet but ok with FL. Recorded po intake bites to 50%. Pt reports she loves the Boost Breeze supplements. Pt receiving 3 times daily and drinking 100% and reports she could drink more frequently. Pt also indicates that she has a case of Boost Breeze at home. Pt also indicates she is tolerating Pro-Stat and taking  Weight up + 3 L  since admission per I/O flow sheet  Pt reports abdominal pain related to her pancreas but reports this is normal. Pt reports diarrhea 2 days ago, but none sense. Pt denies N/V/   Labs: phosphorus 6.0 H), sodium 134, potassium 3.1 (L) Meds: creon, rena-vit, dilaudid, oxycodone, tums prn   Diet Order:   Diet Order            Diet full liquid Room service appropriate? Yes; Fluid consistency: Thin  Diet effective now              EDUCATION NEEDS:   Education needs have been addressed  Skin:  Skin Assessment: Skin Integrity Issues: Skin Integrity Issues:: Other (Comment) Stage I: sacrum Other: skin tear and ecchymosis on R arm  Last BM:  11/12, type 7  Height:   Ht Readings from Last 1 Encounters:  07/25/18 5\' 6"  (1.676 m)    Weight:   Wt Readings from Last 1 Encounters:  08/01/18 62.7 kg    Ideal Body Weight:  56.8 kg  BMI:  Body mass index is 22.31 kg/m.  Estimated Nutritional Needs:   Kcal:  1700-2000 calories daily (30-35 kcal/kg ABW)  Protein:  85-100 g protein daily (1.5-1.8 g/kg ABW)  Fluid:  >/= 1.7 L daily or per MD discretion   Kerman Passey MS, RD, LDN, CNSC [phone] Pager  [phone] Weekend/On-Call Pager

## 2018-08-03 DIAGNOSIS — K8689 Other specified diseases of pancreas: Secondary | ICD-10-CM

## 2018-08-03 LAB — BASIC METABOLIC PANEL
Anion gap: 11 (ref 5–15)
BUN: 69 mg/dL — ABNORMAL HIGH (ref 8–23)
CALCIUM: 8.3 mg/dL — AB (ref 8.9–10.3)
CO2: 20 mmol/L — AB (ref 22–32)
Chloride: 101 mmol/L (ref 98–111)
Creatinine, Ser: 2.94 mg/dL — ABNORMAL HIGH (ref 0.44–1.00)
GFR calc Af Amer: 19 mL/min — ABNORMAL LOW (ref >60)
GFR, EST NON AFRICAN AMERICAN: 16 mL/min — AB (ref >60)
GLUCOSE: 109 mg/dL — AB (ref 70–99)
Potassium: 3.5 mmol/L (ref 3.5–5.1)
Sodium: 132 mmol/L — ABNORMAL LOW (ref 135–145)

## 2018-08-03 LAB — CBC WITH DIFFERENTIAL/PLATELET
ABS IMMATURE GRANULOCYTES: 0.04 10*3/uL (ref 0.00–0.07)
Basophils Absolute: 0.1 10*3/uL (ref 0.0–0.1)
Basophils Relative: 1 %
EOS ABS: 0.1 10*3/uL (ref 0.0–0.5)
Eosinophils Relative: 2 %
HCT: 30.1 % — ABNORMAL LOW (ref 36.0–46.0)
Hemoglobin: 9.3 g/dL — ABNORMAL LOW (ref 12.0–15.0)
Immature Granulocytes: 1 %
LYMPHS ABS: 0.5 10*3/uL — AB (ref 0.7–4.0)
Lymphocytes Relative: 7 %
MCH: 28.3 pg (ref 26.0–34.0)
MCHC: 30.9 g/dL (ref 30.0–36.0)
MCV: 91.5 fL (ref 80.0–100.0)
MONOS PCT: 11 %
Monocytes Absolute: 0.8 10*3/uL (ref 0.1–1.0)
NEUTROS ABS: 5.5 10*3/uL (ref 1.7–7.7)
Neutrophils Relative %: 78 %
Platelets: 266 10*3/uL (ref 150–400)
RBC: 3.29 MIL/uL — ABNORMAL LOW (ref 3.87–5.11)
RDW: 18.2 % — ABNORMAL HIGH (ref 11.5–15.5)
WBC: 6.9 10*3/uL (ref 4.0–10.5)
nRBC: 0 % (ref 0.0–0.2)

## 2018-08-03 LAB — GLUCOSE, CAPILLARY
Glucose-Capillary: 180 mg/dL — ABNORMAL HIGH (ref 70–99)
Glucose-Capillary: 143 mg/dL — ABNORMAL HIGH (ref 70–99)

## 2018-08-03 MED ORDER — PANCRELIPASE (LIP-PROT-AMYL) 12000-38000 UNITS PO CPEP: 12000.0000 [IU] | ORAL_CAPSULE | Freq: Three times a day (TID) | ORAL | 0 refills | Status: DC

## 2018-08-03 MED ORDER — FUROSEMIDE 40 MG PO TABS: 40.0000 mg | ORAL_TABLET | Freq: Every day | ORAL | Status: DC | PRN

## 2018-08-03 MED ORDER — PANTOPRAZOLE SODIUM 40 MG PO TBEC: 40.0000 mg | DELAYED_RELEASE_TABLET | Freq: Every day | ORAL | 0 refills | Status: DC

## 2018-08-03 MED ORDER — GABAPENTIN 300 MG PO CAPS: 300.0000 mg | ORAL_CAPSULE | Freq: Every day | ORAL | 0 refills | Status: DC

## 2018-08-03 MED ORDER — HYDROMORPHONE HCL 2 MG PO TABS: 2.0000 mg | ORAL_TABLET | Freq: Four times a day (QID) | ORAL | 0 refills | Status: AC | PRN

## 2018-08-03 NOTE — Discharge Summary (Signed)
Physician Discharge Summary  [name] JXB:[id number] DOB: 01/22/1951 DOA: 07/24/2018  PCP: Bernerd Limbo, MD  Admit date: 07/24/2018 Discharge date: 08/03/2018  Admitted From: Home Disposition: Home   Recommendations for Outpatient Follow-up:  1. Follow up with PCP in 1-2 weeks 2. Please obtain CMP, lipase, CBC at follow up.  3. Needs close follow up with both nephrology and GI within the next week.  4. Very high risk of readmission, but is tolerating a diet, not needing IV medications, and cleared for discharge from GI and nephrology.  Home Health: PT, OT, RN, aide resumed Equipment/Devices: None Discharge Condition: Stable, improved CODE STATUS: Full Diet recommendation: Full liquid advancing to soft, low sodium  Brief/Interim Summary: [name] is a 67 year old female with history of abdominal aortic aneurysm status post vascular repair, chronic kidney disease stage IV, CAD, recent admission to the hospital for acute pancreatitis with pseudocyst, diastolic CHF, chronic pain syndrome who was brought to the hospital 3 to 4 days after last discharge due to persistent somnolence. She was found to have acute kidney injury for which nephrology was consulted and intermittent dialysis was started. Mental status improved after holding sedating medications and initiation of dialysis. With the holding of pain medications abdominal pain returned. Repeat CT abdomen performed without contrast which demonstrated fluid collection and duodenal thickening (see below) and GI consulted. Dr. Rush Landmark considered EUS as an inpatient though the patient began doing well enough to be discharged with plans to follow up with GI for possible EUS to evaluate cyst wall maturity, consideration of drainage. Nephrology followed throughout hospitalization and recommends close follow up of labs.   Discharge Diagnoses:  Principal Problem:   Acute encephalopathy Active Problems:   Essential hypertension    Anemia   Acute renal failure superimposed on stage 4 chronic kidney disease (HCC)   CAD (coronary artery disease)   Chronic diastolic congestive heart failure (HCC)   Hypokalemia   Chronic pain   AKI (acute kidney injury) ([place])   Goals of care, counseling/discussion   DNR (do not resuscitate) discussion  Acute toxic metabolic encephalopathy: Due to polypharmacy, sedating medications and worsened by renal insufficiency. Resolved.  - Continue low dose dilaudid po which should be safe with renal impairment. Cautioned that this, of course, would not be a long term medication. - Holding benzodiazepines.   Acute kidney injury on chronic kidney disease stage IV: Did get intermittent HD, though creatinine appears to be stabilizing without further treatment. - Cleared for discharge with plans for close follow up per nephrology. CrCl currently stabilizing at 8m/min.   Acute on chronic diastolic CHF with acute hypoxic respiratory failure: Echo done on 05/2018 showed EF of 60 to 613% grade 2 diastolic dysfunction, PA peak pressure of 36 mmHg. Anasarca also noted, likely hypoalbuminemia contributing. - Improved with dialysis managing volume. Holding diuretics for now as it's unclear how much fluid/sodium she will consume.  - Hypoxia resolved.  Acute recurrent pancreatitis with peripancreatic fluid collection: Biliary etiology initially s/p cholecystectomy with prominent cystic duct remnant and 517mstone not felt to be cause of symptoms/amenable to intervention at previous hospitalization.  - Symptoms began reworsening after stopping pain medications, lipase up from prior at 307 on 11/26. CT abdomen and pelvis demonstrated fluid collection and duodenal thickening without evidence of necrosis and no systemic signs of infection.  - Appreciate recommendations from GI, considering EUS as CT was unable to define wall maturity without contrast. Will follow up as outpatient, waiting as long as possible to  allow cyst maturity. No current compressive symptoms.  - Continue po pain medication, dilaudid; STOP oxycodone, oxycontin, hydrocodone, xtampza.   Left foot fracture: Nondisplaced, suspected due to some trauma during AMS based on timing of symptoms. Some evidence of possible erosive arthropathy, possibly due to hx gout. Uric acid low at 5.1.  - Postop shoe - WBAT - Follow up with PCP  Anemia of chronic kidney disease: s/p 2u PRBCs 11/23 with improvement, stable in 9's.  - Continue supplements as able - Aranesp per nephrology  Chronic pain syndrome:  - Holding cymbalta due to low CrCl  Anxiety:  - DC benzodiazepine with encephalopathy  CAD with hx CABG, PCI: No chest pain.  - Continue plavix, BB, statin.   Hypertension - Continue current medications and follow up with PCP. Possibly elevated while here due to pain.  Abdominal aortic aneurysm /PAD: s/p endoluminal stenting. - Continue plavix  Moderate protein-calorie malnutrition:  - Boost, prostat per dietitian recommendations.   Hypokalemia:  - Replaced, recheck at follow up.  Discharge Instructions Discharge Instructions    Diet - low sodium heart healthy   Complete by:  As directed    Discharge instructions   Complete by:  As directed    You were admitted with confusions (encephalopathy) due to sedating medications in the setting of worsening kidney function. This has resolved though pain from pancreatitis continues. Fortunately this is adequately controlled with oral pain medications and you are able to tolerate food/liquids in moderation. You do not currently need further dialysis treatments and GI has recommended no procedures at this time. You are stable for discharge with the following recommendations:  - START taking dilaudid as needed for severe pain, otherwise take tylenol for mild pain.  - START taking creon with meals to help with digestion, supplementing the enzymes your pancreas usually produces - STOP  taking many sedating medications including clonazepam, xtampza, norco, and oxycodone.  - Also STOP taking cymbalta, potassium, magnesium, and spironolactone due to kidney failure - Only take lasix if you notice weight gain and/or decreasing urine output - You can take protonix once daily instead of twice and neurontin only once daily instead of twice daily.  You will need to have lab check this coming week and follow up with the nephrology and GI as scheduled. If your symptoms worsen before those appointment, seek medical attention right away. It is important that you take a full liquid diet-to-soft diet with LOW FAT.   Increase activity slowly   Complete by:  As directed      Allergies as of 08/03/2018      Reactions   Isosorbide Other (See Comments)   Can only tolerate in low doses   Isosorbide Nitrate Other (See Comments)   Can only tolerate in low doses   Oxycodone-acetaminophen Itching   Tolerates Hydrocodone.  Also tolerated oxycodone 07/13/18 admission.     Codeine Itching   Contrast Media [iodinated Diagnostic Agents] Itching, Other (See Comments)   Itching of feet   Ioxaglate Itching, Other (See Comments)   Itching of feet   Metrizamide Itching, Other (See Comments)   Itching of feet   Percocet [oxycodone-acetaminophen] Itching, Other (See Comments)   Tolerates Hydrocodone      Medication List    STOP taking these medications   clonazePAM 0.5 MG tablet Commonly known as:  KLONOPIN   DULoxetine 60 MG capsule Commonly known as:  CYMBALTA   HYDROcodone-acetaminophen 5-325 MG tablet Commonly known as:  NORCO/VICODIN   isosorbide mononitrate 30  MG 24 hr tablet Commonly known as:  IMDUR   MAGNESIUM PO   oxyCODONE 10 mg 12 hr tablet Commonly known as:  OXYCONTIN   Oxycodone HCl 10 MG Tabs   POTASSIUM PO   spironolactone 25 MG tablet Commonly known as:  ALDACTONE   XTAMPZA ER 9 MG C12a Generic drug:  oxyCODONE ER     TAKE these medications   acetaminophen  325 MG tablet Commonly known as:  TYLENOL Take 325 mg by mouth every 6 (six) hours as needed (for pain).   albuterol 108 (90 Base) MCG/ACT inhaler Commonly known as:  PROVENTIL HFA;VENTOLIN HFA Inhale 2 puffs into the lungs every 4 (four) hours as needed for wheezing or shortness of breath. ProAir   allopurinol 100 MG tablet Commonly known as:  ZYLOPRIM Take 100 mg by mouth daily.   amLODipine 10 MG tablet Commonly known as:  NORVASC Take 10 mg by mouth daily.   atorvastatin 40 MG tablet Commonly known as:  LIPITOR Take 40 mg by mouth daily.   budesonide-formoterol 160-4.5 MCG/ACT inhaler Commonly known as:  SYMBICORT Inhale 2 puffs into the lungs 2 (two) times daily as needed (for flares).   carvedilol 12.5 MG tablet Commonly known as:  COREG Take 1 tablet (12.5 mg total) by mouth 2 (two) times daily with a meal.   clopidogrel 75 MG tablet Commonly known as:  PLAVIX Take 1 tablet (75 mg total) by mouth daily with breakfast.   Vitamin B-12 5000 MCG Subl Place 5,000 mcg under the tongue daily.   cyanocobalamin 100 MCG tablet Take 1 tablet (100 mcg total) by mouth daily.   feeding supplement (PRO-STAT SUGAR FREE 64) Liqd Take 30 mLs by mouth 3 (three) times daily with meals.   feeding supplement Liqd Commonly known as:  BOOST / RESOURCE BREEZE Take 1 Container by mouth 2 (two) times daily.   fenofibrate 160 MG tablet Take 1 tablet (160 mg total) by mouth daily.   ferrous sulfate 325 (65 FE) MG tablet Take 1 tablet (325 mg total) by mouth daily with breakfast.   furosemide 40 MG tablet Commonly known as:  LASIX Take 1 tablet (40 mg total) by mouth daily as needed (weight gain). What changed:    when to take this  reasons to take this   gabapentin 300 MG capsule Commonly known as:  NEURONTIN Take 1 capsule (300 mg total) by mouth daily. TAKE 1 CAPSULE(300 MG) BY MOUTH TWICE DAILY What changed:  See the new instructions.   hydrALAZINE 25 MG tablet Commonly  known as:  APRESOLINE Take 1 tablet (25 mg total) by mouth every 8 (eight) hours.   HYDROmorphone 2 MG tablet Commonly known as:  DILAUDID Take 1 tablet (2 mg total) by mouth every 6 (six) hours as needed for up to 5 days for moderate pain or severe pain.   lipase/protease/amylase 12000 units Cpep capsule Commonly known as:  CREON Take 1 capsule (12,000 Units total) by mouth 3 (three) times daily with meals.   loperamide 2 MG tablet Commonly known as:  IMODIUM A-D Take 2 mg by mouth 4 (four) times daily as needed for diarrhea or loose stools.   NARCAN 4 MG/0.1ML Liqd nasal spray kit Generic drug:  naloxone Place 1 spray into the nose once.   pantoprazole 40 MG tablet Commonly known as:  PROTONIX Take 1 tablet (40 mg total) by mouth daily. What changed:  when to take this   polyethylene glycol packet Commonly known as:  MIRALAX /  GLYCOLAX Take 17 g by mouth daily as needed for mild constipation.   Vitamin D3 125 MCG (5000 UT) Tabs Take 5,000 Units by mouth daily.       Contact information for follow-up providers    Elmarie Shiley, MD Follow up on 08/17/2018.   Specialty:  Nephrology Why:  As scheduled. We will call to arrange labs for next week.  Contact information: [place] [address] [phone]        Bernerd Limbo, MD Follow up.   Specialty:  Family Medicine Contact information: [place] [address] [phone]        Mansouraty, Telford Nab., MD. Call in 1 week(s).   Specialties:  Gastroenterology, Internal Medicine Contact information: [address] [place] [address] [phone]            Contact information for after-discharge care    Destination    [facility] Preferred SNF .   Service:  Skilled Nursing Contact information: [place] [phone]                 Allergies  Allergen Reactions  .  Isosorbide Other (See Comments)    Can only tolerate in low doses  . Isosorbide Nitrate Other (See Comments)    Can only tolerate in low doses  . Oxycodone-Acetaminophen Itching    Tolerates Hydrocodone.  Also tolerated oxycodone 07/13/18 admission.    . Codeine Itching  . Contrast Media [Iodinated Diagnostic Agents] Itching and Other (See Comments)    Itching of feet  . Ioxaglate Itching and Other (See Comments)    Itching of feet  . Metrizamide Itching and Other (See Comments)    Itching of feet  . Percocet [Oxycodone-Acetaminophen] Itching and Other (See Comments)    Tolerates Hydrocodone    Consultations:  Nephrology  GI  Procedures/Studies: Ct Abdomen Pelvis Wo Contrast  Result Date: 08/01/2018 CLINICAL DATA:  67 year old female with recurrent pancreatitis. History of pseudocyst. Abdominal pain and tenderness. Post cholecystectomy 2 and half months ago. Subsequent encounter. EXAM: CT ABDOMEN AND PELVIS WITHOUT CONTRAST TECHNIQUE: Multidetector CT imaging of the abdomen and pelvis was performed following the standard protocol without IV contrast. COMPARISON:  07/12/2018 hepatobiliary study. 07/10/2018 CT and MR. 05/28/2018 CT FINDINGS: Lower chest: Interval clearing of previously noted left lower lobe consolidation. Septal thickening and small pleural effusions greater on right may reflect changes of mild fluid overload given the diffuse third spacing of fluid. Heart incompletely assessed. Coronary artery calcifications noted. Hepatobiliary: Enlarged liver spanning over 19.6 cm. Taking into account limitation by non contrast imaging, no focal worrisome hepatic lesion. Post cholecystectomy. 6 mm stone in gallbladder stump. Perihepatic fluid. Prior nuclear medicine study did not reveal postoperative bile leak. Pancreas: Slightly hyperdense complex process spanning over 8 cm centered at the pancreatic head and duodenal sweep has not improved. This is causing significant narrowing and  thickening of the duodenum. Spleen: Taking into account limitation by non contrast imaging, no splenic mass or enlargement. Adrenals/Urinary Tract: Bilateral nonobstructing renal calculi/vascular calcifications. Slightly lobulated contour of the kidneys. No obvious mass noted on this unenhanced exam. No adrenal lesion. Noncontrast filled imaging of the urinary bladder unremarkable. Stomach/Bowel: Under distended stomach. Small hiatal hernia. Markedly abnormal appearance of the duodenum as noted above. Postsurgical changes rectosigmoid region. Hepatic flexure of colon surrounded by fluid but does not appear to be the primary source. Suspect prior appendectomy. Vascular/Lymphatic:  Atherosclerotic changes aorta. Post endoluminal stenting. Native aortic aneurysm measures up to 4.9 cm without change. Renal artery stent. Reactive lymph nodes upper abdomen. Reproductive: Post hysterectomy.  No worrisome adnexal mass. Other: Prominent third spacing of fluid. Complex fluid collection adjacent to the lateral border of the left psoas muscle without change. This measures up to 5 cm. Musculoskeletal: Scoliosis with superimposed degenerative changes without acute osseous abnormality. IMPRESSION: 1. Slightly hyperdense complex process spanning over 8 cm centered at the pancreatic head and duodenal sweep has not improved. This is causing significant narrowing and thickening of the duodenum. It is possible this is a result of hemorrhagic pancreatitis given the hyperdense appearance. This does not have typical appearance of a pancreatic pseudocyst. Hematoma, infection or biloma are other considerations. 2. Interval clearing of previously noted left lower lobe consolidation. Septal thickening and small pleural effusions greater on right may reflect changes of mild fluid overload given the diffuse third spacing of fluid. 3. Post cholecystectomy. 6 mm stone in gallbladder stump. 4. Atherosclerotic changes aorta. Post endoluminal  stenting. Native aortic aneurysm measures up to 4.9 cm without change. 5. Complex fluid collection adjacent to the lateral border of the left psoas muscle without change. This measures up to 5 cm. Electronically Signed   By: Genia Del M.D.   On: 08/01/2018 17:34   Ct Abdomen Pelvis Wo Contrast  Result Date: 07/10/2018 CLINICAL DATA:  67 year old female with new pain and fever. Also for follow-up of abdominal collection between the duodenum and pancreatic head. EXAM: CT ABDOMEN AND PELVIS WITHOUT CONTRAST TECHNIQUE: Multidetector CT imaging of the abdomen and pelvis was performed following the standard protocol without IV contrast. COMPARISON:  07/10/2018 MR, 07/08/2018 ultrasound, 05/28/2018 CT and prior studies FINDINGS: Please note that parenchymal abnormalities may be missed without intravenous contrast. Lower chest: New airspace disease and consolidation within the LEFT LOWER lobe is compatible with pneumonia. A trace LEFT pleural effusion is noted. Cardiomegaly is identified. Hepatobiliary: The liver is unremarkable. The patient is status post cholecystectomy. No biliary dilatation. Pancreas: Unchanged. Ill-defined 4-5 cm heterogeneous structure in the region the pancreatic head/descending duodenum is unchanged measuring approximately 5 cm. No pancreatic ductal dilatation. Spleen: Unremarkable Adrenals/Urinary Tract: Punctate nonobstructing bilateral renal calculi are again identified. No acute abnormality. Foley catheter in the bladder is noted. Fullness of the LEFT adrenal gland again noted. Stomach/Bowel: No significant change. No bowel obstruction. Fullness/indistinctness along the descending duodenum again noted. Vascular/Lymphatic: Aorto bi-iliac stent graft again identified. Native infrarenal suprailiac abdominal aortic aneurysm sac measures 5 cm. Renal stent again noted. No enlarged lymph nodes identified. Reproductive: Status post hysterectomy. No adnexal masses. Other: Diffuse subcutaneous  edema noted. No ascites, new collection or pneumoperitoneum. Musculoskeletal: No acute or suspicious bony abnormalities. IMPRESSION: 1. New LEFT LOWER pneumonia. 2. Ill-defined 4-5 cm heterogeneous structure along the pancreatic head/descending duodenum which may represent postoperative changes, hematoma or biloma. Infection is considered less likely. 3. Diffuse subcutaneous edema 4. Aorto bi-iliac stent graft 5. Punctate nonobstructing bilateral renal calculi Electronically Signed   By: Margarette Canada M.D.   On: 07/10/2018 18:15   Ct Head Wo Contrast  Result Date: 07/24/2018 CLINICAL DATA:  Altered mental status, somnolent. History of anemia, diabetes, pancreatitis. EXAM: CT HEAD WITHOUT CONTRAST TECHNIQUE: Contiguous axial images were obtained from the base of the skull through the vertex without intravenous contrast. COMPARISON:  CT HEAD June 30, 2018 FINDINGS: BRAIN: No intraparenchymal hemorrhage, mass effect nor midline shift. Mild parenchymal brain volume loss. Patchy supratentorial white matter hypodensities within  normal range for patient's age, though non-specific are most compatible with chronic small vessel ischemic disease. No acute large vascular territory infarcts. No abnormal extra-axial fluid collections. Basal cisterns are patent. VASCULAR: Severe calcific atherosclerosis of the carotid siphons. SKULL: No skull fracture. No significant scalp soft tissue swelling. SINUSES/ORBITS: Trace paranasal sinus mucosal thickening. Mastoid air cells are well aerated.The included ocular globes and orbital contents are non-suspicious. OTHER: None. IMPRESSION: 1. No acute intracranial process. 2. Mild parenchymal brain volume loss. 3. Mild chronic small vessel ischemic changes. Severe intracranial atherosclerosis. Electronically Signed   By: Elon Alas M.D.   On: 07/24/2018 20:33   Mr Abdomen Mrcp Wo Contrast  Result Date: 07/10/2018 CLINICAL DATA:  Right upper quadrant pain. Gallstone  pancreatitis. 2 months postop from cholecystectomy. Chronic kidney disease. EXAM: MRI ABDOMEN WITHOUT CONTRAST  (INCLUDING MRCP) TECHNIQUE: Multiplanar multisequence MR imaging of the abdomen was performed. Heavily T2-weighted images of the biliary and pancreatic ducts were obtained, and three-dimensional MRCP images were rendered by post processing. COMPARISON:  CT on 05/28/2018 FINDINGS: Lower chest: No acute findings. Hepatobiliary: No masses visualized on this unenhanced exam. 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 Prior cholecystectomy. Dilated cystic duct stump is seen extending into the gallbladder fossa which contains a residual small calculus measuring approximately 5 mm. A small amount of fluid is seen in the gallbladder fossa and perihepatic space. Common bile duct measures 9 mm in diameter, which is within normal limits status post cholecystectomy. No evidence of choledocholithiasis. Pancreas: A heterogeneous mass with surrounding inflammatory changes is seen which abuts the pancreatic head and descending duodenum. This measures 4.9 x 3.7 cm on image 60/1100, and contains multiple small internal cystic foci. This has mildly decreased in size since previous study when it measured approximately 5.7 x 4.3 cm. Remainder of the pancreas is normal appearance, and there is no evidence of pancreatic ductal dilatation. Spleen: Within normal limits in size. Small amount of perisplenic fluid. Diffuse T2 hypointensity, consistent with hemosiderosis. Adrenals/Urinary tract: A few small renal cysts are noted bilaterally. No evidence of hydronephrosis. Stomach/Bowel: Marked decrease in wall thickening involving the hepatic flexure of the colon. No evidence of bowel obstruction. Vascular/Lymphatic: No pathologically enlarged lymph nodes identified. Aortic stent graft again noted. Stable size of native abdominal aortic aneurysm measuring 4.9 cm. Other: Diffuse body wall and  mesenteric edema. Mild ascites in perihepatic and perisplenic spaces. Musculoskeletal:  No suspicious bone lesions identified. IMPRESSION: Prior cholecystectomy. Dilated cystic duct stump in gallbladder fossa containing a 5 mm calculus. No evidence of biliary ductal dilatation or choledocholithiasis. Mild decrease in size of heterogeneous "mass" with internal cystic foci and surrounding inflammatory changes between the descending duodenum and pancreatic head. This may represent a biloma, abscess, or evolving pancreatic pseudocyst. Small amount of fluid in gallbladder fossa and upper abdomen; postop bile leak cannot be excluded. Consider nuclear medicine hepatobiliary scan for further evaluation. Decreased bowel wall thickening involving the hepatic flexure of colon. Aortic stent graft with stable size of 4.9 cm native abdominal aortic aneurysm. Hemosiderosis. Electronically Signed   By: Earle Gell M.D.   On: 07/10/2018 09:23   Mr 3d Recon At Scanner  Result Date: 07/10/2018 CLINICAL DATA:  Right upper quadrant pain. Gallstone pancreatitis. 2 months postop from cholecystectomy. Chronic kidney disease. EXAM: MRI ABDOMEN WITHOUT CONTRAST  (INCLUDING MRCP) TECHNIQUE: Multiplanar multisequence MR imaging of the abdomen was performed. Heavily T2-weighted images of the biliary and pancreatic ducts were obtained, and three-dimensional MRCP images were rendered  by post processing. COMPARISON:  CT on 05/28/2018 FINDINGS: Lower chest: No acute findings. Hepatobiliary: No masses visualized on this unenhanced exam. 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 Prior cholecystectomy. Dilated cystic duct stump is seen extending into the gallbladder fossa which contains a residual small calculus measuring approximately 5 mm. A small amount of fluid is seen in the gallbladder fossa and perihepatic space. Common bile duct measures 9 mm in diameter, which is within normal limits status post  cholecystectomy. No evidence of choledocholithiasis. Pancreas: A heterogeneous mass with surrounding inflammatory changes is seen which abuts the pancreatic head and descending duodenum. This measures 4.9 x 3.7 cm on image 60/1100, and contains multiple small internal cystic foci. This has mildly decreased in size since previous study when it measured approximately 5.7 x 4.3 cm. Remainder of the pancreas is normal appearance, and there is no evidence of pancreatic ductal dilatation. Spleen: Within normal limits in size. Small amount of perisplenic fluid. Diffuse T2 hypointensity, consistent with hemosiderosis. Adrenals/Urinary tract: A few small renal cysts are noted bilaterally. No evidence of hydronephrosis. Stomach/Bowel: Marked decrease in wall thickening involving the hepatic flexure of the colon. No evidence of bowel obstruction. Vascular/Lymphatic: No pathologically enlarged lymph nodes identified. Aortic stent graft again noted. Stable size of native abdominal aortic aneurysm measuring 4.9 cm. Other: Diffuse body wall and mesenteric edema. Mild ascites in perihepatic and perisplenic spaces. Musculoskeletal:  No suspicious bone lesions identified. IMPRESSION: Prior cholecystectomy. Dilated cystic duct stump in gallbladder fossa containing a 5 mm calculus. No evidence of biliary ductal dilatation or choledocholithiasis. Mild decrease in size of heterogeneous "mass" with internal cystic foci and surrounding inflammatory changes between the descending duodenum and pancreatic head. This may represent a biloma, abscess, or evolving pancreatic pseudocyst. Small amount of fluid in gallbladder fossa and upper abdomen; postop bile leak cannot be excluded. Consider nuclear medicine hepatobiliary scan for further evaluation. Decreased bowel wall thickening involving the hepatic flexure of colon. Aortic stent graft with stable size of 4.9 cm native abdominal aortic aneurysm. Hemosiderosis. Electronically Signed   By:  Earle Gell M.D.   On: 07/10/2018 09:23   Dg Chest Portable 1 View  Result Date: 07/24/2018 CLINICAL DATA:  Altered level of consciousness. EXAM: PORTABLE CHEST 1 VIEW COMPARISON:  07/08/2018 FINDINGS: Previous median sternotomy. Aortic atherosclerosis. Mild cardiac enlargement. Moderate diffuse pulmonary edema is similar to previous exam. No focal airspace opacities. IMPRESSION: 1. Cardiac enlargement and pulmonary edema. 2.  Aortic Atherosclerosis (ICD10-I70.0). Electronically Signed   By: Kerby Moors M.D.   On: 07/24/2018 20:38   Dg Abdomen Acute W/chest  Result Date: 07/08/2018 CLINICAL DATA:  Right lower quadrant pain. EXAM: DG ABDOMEN ACUTE W/ 1V CHEST COMPARISON:  Chest x-ray dated June 17, 2018. Abdominal x-ray dated June 15, 2018. FINDINGS: Stable cardiomegaly status post CABG, aortic valve replacement, and left atrial appendage clipping. Worsened pulmonary vascular congestion with new mild interstitial edema. No focal consolidation, pleural effusion, or pneumothorax. There is no evidence of dilated bowel loops or free intraperitoneal air. No radiopaque calculi or other significant radiographic abnormality is seen. Unchanged aorto bi-iliac stent graft and left renal artery stent. No acute osseous abnormality. IMPRESSION: 1. Worsening pulmonary vascular congestion with new mild interstitial pulmonary edema. 2. No acute abdominal finding. Electronically Signed   By: Titus Dubin M.D.   On: 07/08/2018 12:41   Dg Foot Complete Left  Result Date: 07/30/2018 CLINICAL DATA:  Lethargy.  Right foot swelling. EXAM: LEFT FOOT - COMPLETE 3+  VIEW COMPARISON:  MRI of the right foot dated 03/27/2016 FINDINGS: Cortical discontinuity in the distal metaphysis of the proximal phalanx small toe suspicious for fracture, not present on 03/19/2016. Small marginal erosion along the base of the proximal phalanx third toe, likewise new compared to the prior exam. No malalignment at the Lisfranc joint. There  is some dorsal periosteal elevation in the vicinity of the base of the proximal first metatarsal as suggested on the lateral projection. Notable soft tissue swelling along the distal dorsum of the foot. Plantar calcaneal spur.  Vascular calcification noted. IMPRESSION: 1. Suspected nondisplaced fracture of the distal metaphysis proximal phalanx small toe. 2. Small marginal erosion along the base of the proximal phalanx third toe, potentially from gout or other erosive arthropathy. 3. Notable soft tissue swelling along the distal dorsum of the foot. 4. Dorsal periosteal elevation in the vicinity of the base of the proximal first metatarsal on the lateral projection, mild periostitis is not excluded. Electronically Signed   By: Van Clines M.D.   On: 07/30/2018 18:57   Dg Foot Complete Right  Result Date: 07/30/2018 CLINICAL DATA:  Left foot pain EXAM: RIGHT FOOT COMPLETE - 3+ VIEW COMPARISON:  None. FINDINGS: The toes are flexed on imaging which reduces sensitivity. No malalignment at the Lisfranc joint. Small erosion along the distal first metatarsal head medially, best seen on the oblique projection. Mild dorsal soft tissue swelling along the forefoot. Plantar calcaneal spur noted. IMPRESSION: 1. Mild dorsal soft tissue swelling along the forefoot. 2. Small erosion along the first metatarsal head, possibly a manifestation of gout arthropathy. Electronically Signed   By: Van Clines M.D.   On: 07/30/2018 18:59   Nm Hepato Biliary Leak  Result Date: 07/12/2018 CLINICAL DATA:  Patient status post cholecystectomy. Persistent abdominal pain. Cystic duct remnant noted on comparison MRI. EXAM: NUCLEAR MEDICINE HEPATOBILIARY IMAGING TECHNIQUE: Sequential images of the abdomen were obtained out to 120 minutes following intravenous administration of radiopharmaceutical. RADIOPHARMACEUTICALS:  5.5 mCi Tc-8m Choletec IV COMPARISON:  MRI 06/09/2018, CT 06/09/2018 FINDINGS: Prompt clearance of  radiotracer from the blood pool and homogeneous accumulation within liver parenchyma. Counts are promptly excreted into the extrahepatic ducts and common bile ducts. The duodenum is evident by 20 minutes. Small bowel evident by 40 minutes. The cystic duct remnant begins to fill by 40 minutes. This duct continues to fill over the course the study (120 minutes). No evidence of extraluminal radiotracer to suggest bile leak. IMPRESSION: 1. No evidence of bile leak or obstruction. 2. Patent common bile duct. 3. Prominent cystic duct remnant as seen on comparison MRI. Electronically Signed   By: SSuzy BouchardM.D.   On: 07/12/2018 14:46   [place]domen Limited Ruq  Result Date: 07/08/2018 CLINICAL DATA:  Right upper quadrant pain since last night. EXAM: ULTRASOUND ABDOMEN LIMITED RIGHT UPPER QUADRANT COMPARISON:  CT abdomen 05/28/2018, 04/12/2018 FINDINGS: Gallbladder: Prior cholecystectomy. 7.2 x 3.2 x 3.2 cm thick walled fluid collection with hyperechoic wall and central hypoechogenicity with the hypoechoic area measuring approximately 3.2 x 1.2 x 1.3 cm. Trace amount of fluid outside of the collection. There is no significant surrounding Doppler flow to suggest a phlegmon. Common bile duct: Diameter: 8.7 mm.  Proximal pancreatic duct measures 3.7 mm. Liver: No focal hepatic mass. Intrahepatic biliary ductal dilatation. Portal vein is patent on color Doppler imaging with normal direction of blood flow towards the liver. No significant perihepatic ascites. IMPRESSION: 1. Prior cholecystectomy. 7.2 x 3.2 x 3.2 cm complex fluid collection  in the gallbladder fossa likely reflecting a postoperative hematoma. The liquified portion measures approximately 3.2 x 1.2 x 1.3 cm which is smaller compared with the prior CT dated 05/28/2018. Electronically Signed   By: Kathreen Devoid   On: 07/08/2018 17:21     Subjective: Feels adamant that she is going home today. Tolerating soft diet but hates the food "the grits were like  concrete." She reports significant improvement in abdominal pain with oral dilaudid and has not required IV medications today. Says she wants to go home and eat bojangles and a whopper.   Discharge Exam: Vitals:   08/03/18 0848 08/03/18 0927  BP:  (!) 147/60  Pulse:  66  Resp:  18  Temp:  97.9 F (36.6 C)  SpO2: 95% 96%   General: Pt is alert, awake, not in acute distress Cardiovascular: RRR, S1/S2 +, no rubs, no gallops Respiratory: CTA bilaterally, no wheezing, no rhonchi Abdominal: Soft, improved tenderness, still mild in epigastrium and right quadrants, nondistended without palpable masses. +BS.  Extremities: Trace edema, no cyanosis  Labs: BNP (last 3 results) Recent Labs    04/17/18 0534 04/18/18 0501 05/28/18 2325  BNP 2,002.3* 1,099.7* 6,962.9*   Basic Metabolic Panel: Recent Labs  Lab 07/29/18 5284 07/30/18 0551 07/31/18 0447 08/01/18 0658 08/02/18 0520 08/03/18 0450  NA 134* 134* 133* 133* 134* 132*  K 3.4* 3.1* 3.3* 3.2* 3.1* 3.5  CL 101 101 101 102 101 101  CO2 '26 24 23 22 22 '$ 20*  GLUCOSE 116* 101* 137* 108* 146* 109*  BUN 31* 41* 48* 59* 63* 69*  CREATININE 2.17* 2.35* 2.72* 2.83* 2.92* 2.94*  CALCIUM 7.8* 8.2* 8.2* 8.2* 8.4* 8.3*  PHOS 3.0 4.2 4.5 5.8* 6.0*  --    Liver Function Tests: Recent Labs  Lab 07/29/18 0628 07/30/18 0551 07/31/18 0447 08/01/18 0658 08/02/18 0520  AST  --   --   --  15  --   ALT  --   --   --  7  --   ALKPHOS  --   --   --  40  --   BILITOT  --   --   --  0.8  --   PROT  --   --   --  5.0*  --   ALBUMIN 2.2* 2.3* 2.4* 2.5*  2.5* 2.6*   Recent Labs  Lab 08/01/18 0658  LIPASE 301*   No results for input(s): AMMONIA in the last 168 hours. CBC: Recent Labs  Lab 07/30/18 0551 07/31/18 0447 08/01/18 0658 08/02/18 0520 08/03/18 0450  WBC 6.6 7.4 6.6 8.7 6.9  NEUTROABS 4.8 5.7 5.1 7.3 5.5  HGB 9.2* 9.5* 9.3* 9.6* 9.3*  HCT 30.0* 31.2* 30.6* 30.7* 30.1*  MCV 91.2 91.0 91.1 91.4 91.5  PLT 194 220 242 269 266    Cardiac Enzymes: No results for input(s): CKTOTAL, CKMB, CKMBINDEX, TROPONINI in the last 168 hours. BNP: Invalid input(s): POCBNP CBG: Recent Labs  Lab 08/02/18 1144 08/02/18 1641 08/02/18 2110 08/03/18 0742 08/03/18 1117  GLUCAP 153* 117* 125* 180* 143*   D-Dimer No results for input(s): DDIMER in the last 72 hours. Hgb A1c No results for input(s): HGBA1C in the last 72 hours. Lipid Profile No results for input(s): CHOL, HDL, LDLCALC, TRIG, CHOLHDL, LDLDIRECT in the last 72 hours. Thyroid function studies No results for input(s): TSH, T4TOTAL, T3FREE, THYROIDAB in the last 72 hours.  Invalid input(s): FREET3 Anemia work up No results for input(s): VITAMINB12, FOLATE, FERRITIN, TIBC, IRON, RETICCTPCT  in the last 72 hours. Urinalysis    Component Value Date/Time   COLORURINE YELLOW 07/25/2018 0530   APPEARANCEUR CLEAR 07/25/2018 0530   LABSPEC 1.008 07/25/2018 0530   PHURINE 5.0 07/25/2018 0530   GLUCOSEU NEGATIVE 07/25/2018 0530   HGBUR NEGATIVE 07/25/2018 0530   BILIRUBINUR NEGATIVE 07/25/2018 0530   KETONESUR NEGATIVE 07/25/2018 0530   PROTEINUR NEGATIVE 07/25/2018 0530   UROBILINOGEN 0.2 03/04/2011 2107   NITRITE NEGATIVE 07/25/2018 0530   LEUKOCYTESUR TRACE (A) 07/25/2018 0530    Microbiology No results found for this or any previous visit (from the past 240 hour(s)).  Time coordinating discharge: Approximately 40 minutes  Patrecia Pour, MD  [facility] 08/03/2018, 4:31 PM Pager [phone]

## 2018-08-03 NOTE — Progress Notes (Signed)
Admit: 07/24/2018 LOS: 60  47F with progressive renal failure, probable ESRD  Subjective:  . Serum creatinine is stable today.   . Potassium 3.5, bicarbonate 20.  Hemoglobin 9.3. . 0.9 L urine output . Patient strongly wishes to go home  11/27 0701 - 11/28 0700 In: 1000 [P.O.:600; IV Piggyback:400] Out: 850 [Urine:850]  Filed Weights   07/30/18 0500 07/30/18 2020 08/01/18 2036  Weight: 57.3 kg 58.1 kg 62.7 kg    Scheduled Meds: . allopurinol  100 mg Oral Daily  . amLODipine  10 mg Oral Daily  . atorvastatin  40 mg Oral q1800  . carvedilol  12.5 mg Oral BID WC  . Chlorhexidine Gluconate Cloth  6 each Topical Q0600  . clopidogrel  75 mg Oral Q breakfast  . [START ON 08/05/2018] darbepoetin (ARANESP) injection - DIALYSIS  60 mcg Intravenous Q Sat-HD  . feeding supplement  1 Container Oral TID WC & HS  . feeding supplement (PRO-STAT SUGAR FREE 64)  30 mL Oral TID  . gabapentin  300 mg Oral Daily  . heparin  5,000 Units Subcutaneous Q8H  . hydrALAZINE  75 mg Oral Q8H  . lipase/protease/amylase  12,000 Units Oral TID WC  . mometasone-formoterol  2 puff Inhalation BID  . multivitamin  1 tablet Oral QHS  . pantoprazole  40 mg Oral Daily  . sodium chloride flush  3 mL Intravenous Q12H   Continuous Infusions:  PRN Meds:.acetaminophen **OR** acetaminophen, calcium carbonate, HYDROmorphone (DILAUDID) injection, HYDROmorphone, naLOXone (NARCAN)  injection, ondansetron **OR** ondansetron (ZOFRAN) IV  Current Labs: reviewed    Physical Exam:  Blood pressure (!) 147/60, pulse 66, temperature 97.9 F (36.6 C), temperature source Oral, resp. rate 18, height 5\' 6"  (1.676 m), weight 62.7 kg, SpO2 96 %. NAD RRR CTAB LUE AVF +B/T L>R LEE, 2+ on L Nonfocal, aao x3  A 1. Advanced CKD4, transiently required hemodialysis but appears to have stabilized with sufficient GFR not to require further dialysis; 2. AMS, continues to improve; resolved 3. HTN, BPs stable for now 4. Hypokalemia,  K 3.3 5. Malnutrition, hx/o pancreatitis 6. ANemia, on ESA qSat; s/p tranfusion 11/23 during admit   P . Okay for discharge today . We will set up labs next week . Has follow-up already scheduled with Dr. Posey Pronto on 12/12 at 4:30 PM . Hold diuretics for now, unclear how much salt and water she will consume; if her home weights increase she should resume her furosemide 40 mg twice daily   Pearson Grippe MD 08/03/2018, 10:39 AM  Recent Labs  Lab 07/31/18 0447 08/01/18 0658 08/02/18 0520 08/03/18 0450  NA 133* 133* 134* 132*  K 3.3* 3.2* 3.1* 3.5  CL 101 102 101 101  CO2 23 22 22  20*  GLUCOSE 137* 108* 146* 109*  BUN 48* 59* 63* 69*  CREATININE 2.72* 2.83* 2.92* 2.94*  CALCIUM 8.2* 8.2* 8.4* 8.3*  PHOS 4.5 5.8* 6.0*  --    Recent Labs  Lab 08/01/18 0658 08/02/18 0520 08/03/18 0450  WBC 6.6 8.7 6.9  NEUTROABS 5.1 7.3 5.5  HGB 9.3* 9.6* 9.3*  HCT 30.6* 30.7* 30.1*  MCV 91.1 91.4 91.5  PLT 242 269 266

## 2018-08-03 NOTE — Progress Notes (Signed)
Gastroenterology Inpatient Follow-up Note   PATIENT IDENTIFICATION  [name] is a 67 y.o. female with a pmh significant for CAD, COPD, AAA (s/p repair), GERD, Pancreatitis (s/p CCY) complicated by peripancreatic fluid collection/cyst development in HOP/Duodenal region, Cystic Duct stone. Hospital Day: 11  SUBJECTIVE  The patient states that she is feeling much improved today and pain is well controlled on Dilaudid. She is adamant that she wants to leave the hospital today because she wants to enjoy Thanksgiving and her family and enjoy better food than [facility] can provide. The patient was seen by nephrology with no plan for repeat dialysis at this point in time. Patient feels comfortable that she can wait a few weeks as had initially been the plan for follow-up and repeat imaging to discuss the possibility of cyst enterostomy down the road. The patient denies fevers or chills. She was able to tolerate grits but did not like the taste of it today.   OBJECTIVE  Scheduled Inpatient Medications:  . allopurinol  100 mg Oral Daily  . amLODipine  10 mg Oral Daily  . atorvastatin  40 mg Oral q1800  . carvedilol  12.5 mg Oral BID WC  . Chlorhexidine Gluconate Cloth  6 each Topical Q0600  . clopidogrel  75 mg Oral Q breakfast  . [START ON 08/05/2018] darbepoetin (ARANESP) injection - DIALYSIS  60 mcg Intravenous Q Sat-HD  . feeding supplement  1 Container Oral TID WC & HS  . feeding supplement (PRO-STAT SUGAR FREE 64)  30 mL Oral TID  . gabapentin  300 mg Oral Daily  . heparin  5,000 Units Subcutaneous Q8H  . hydrALAZINE  75 mg Oral Q8H  . lipase/protease/amylase  12,000 Units Oral TID WC  . mometasone-formoterol  2 puff Inhalation BID  . multivitamin  1 tablet Oral QHS  . pantoprazole  40 mg Oral Daily  . sodium chloride flush  3 mL Intravenous Q12H   Continuous Inpatient Infusions:  PRN Inpatient Medications: acetaminophen **OR** acetaminophen, calcium carbonate, HYDROmorphone  (DILAUDID) injection, HYDROmorphone, naLOXone (NARCAN)  injection, ondansetron **OR** ondansetron (ZOFRAN) IV   Physical Examination  Temp:  [97.9 F (36.6 C)-99 F (37.2 C)] 97.9 F (36.6 C) (11/28 7371) Pulse Rate:  [63-72] 66 (11/28 0927) Resp:  [16-18] 18 (11/28 0927) BP: (130-148)/(46-66) 147/60 (11/28 0927) SpO2:  [94 %-98 %] 96 % (11/28 0927) Temp (24hrs), Avg:98.3 F (36.8 C), Min:97.9 F (36.6 C), Max:99 F (37.2 C)  Weight: 62.7 kg GEN: NAD, appears the most well since I remember her more than a month and a half ago since the last time I saw her, no family present  PSYCH: Cooperative, without pressured speech EYE: Conjunctivae pink, sclerae anicteric ENT: MMM CV: RR without R/Gs RESP: Decreased breath sounds at the bases bilaterally GI: NABS, soft, tenderness to palpation in the right upper quadrant/midepigastric region upon very deep palpation, without guarding or rebound  MSK/EXT: Bilateral lower extremity edema present SKIN: No jaundice NEURO:  Alert & Oriented x 3, no focal deficits   Review of Data   Laboratory Studies   Recent Labs  Lab 07/31/18 0447 08/01/18 0658 08/02/18 0520 08/03/18 0450  NA 133* 133* 134* 132*  K 3.3* 3.2* 3.1* 3.5  CL 101 102 101 101  CO2 23 22 22  20*  BUN 48* 59* 63* 69*  CREATININE 2.72* 2.83* 2.92* 2.94*  GLUCOSE 137* 108* 146* 109*  CALCIUM 8.2* 8.2* 8.4* 8.3*  PHOS 4.5 5.8* 6.0*  --    Recent Labs  Lab 08/01/18 0658  AST 15  ALT 7  ALKPHOS 40    Recent Labs  Lab 08/01/18 0658 08/02/18 0520 08/03/18 0450  WBC 6.6 8.7 6.9  HGB 9.3* 9.6* 9.3*  HCT 30.6* 30.7* 30.1*  PLT 242 269 266   No results for input(s): APTT, INR in the last 168 hours. Computed MELD-Na score unavailable. Necessary lab results were not found in the last year. Computed MELD score unavailable. Necessary lab results were not found in the last year.  Imaging Studies  Ct Abdomen Pelvis Wo Contrast  Result Date: 08/01/2018 CLINICAL DATA:   67 year old female with recurrent pancreatitis. History of pseudocyst. Abdominal pain and tenderness. Post cholecystectomy 2 and half months ago. Subsequent encounter. EXAM: CT ABDOMEN AND PELVIS WITHOUT CONTRAST TECHNIQUE: Multidetector CT imaging of the abdomen and pelvis was performed following the standard protocol without IV contrast. COMPARISON:  07/12/2018 hepatobiliary study. 07/10/2018 CT and MR. 05/28/2018 CT FINDINGS: Lower chest: Interval clearing of previously noted left lower lobe consolidation. Septal thickening and small pleural effusions greater on right may reflect changes of mild fluid overload given the diffuse third spacing of fluid. Heart incompletely assessed. Coronary artery calcifications noted. Hepatobiliary: Enlarged liver spanning over 19.6 cm. Taking into account limitation by non contrast imaging, no focal worrisome hepatic lesion. Post cholecystectomy. 6 mm stone in gallbladder stump. Perihepatic fluid. Prior nuclear medicine study did not reveal postoperative bile leak. Pancreas: Slightly hyperdense complex process spanning over 8 cm centered at the pancreatic head and duodenal sweep has not improved. This is causing significant narrowing and thickening of the duodenum. Spleen: Taking into account limitation by non contrast imaging, no splenic mass or enlargement. Adrenals/Urinary Tract: Bilateral nonobstructing renal calculi/vascular calcifications. Slightly lobulated contour of the kidneys. No obvious mass noted on this unenhanced exam. No adrenal lesion. Noncontrast filled imaging of the urinary bladder unremarkable. Stomach/Bowel: Under distended stomach. Small hiatal hernia. Markedly abnormal appearance of the duodenum as noted above. Postsurgical changes rectosigmoid region. Hepatic flexure of colon surrounded by fluid but does not appear to be the primary source. Suspect prior appendectomy. Vascular/Lymphatic: Atherosclerotic changes aorta. Post endoluminal stenting. Native  aortic aneurysm measures up to 4.9 cm without change. Renal artery stent. Reactive lymph nodes upper abdomen. Reproductive: Post hysterectomy.  No worrisome adnexal mass. Other: Prominent third spacing of fluid. Complex fluid collection adjacent to the lateral border of the left psoas muscle without change. This measures up to 5 cm. Musculoskeletal: Scoliosis with superimposed degenerative changes without acute osseous abnormality. IMPRESSION: 1. Slightly hyperdense complex process spanning over 8 cm centered at the pancreatic head and duodenal sweep has not improved. This is causing significant narrowing and thickening of the duodenum. It is possible this is a result of hemorrhagic pancreatitis given the hyperdense appearance. This does not have typical appearance of a pancreatic pseudocyst. Hematoma, infection or biloma are other considerations. 2. Interval clearing of previously noted left lower lobe consolidation. Septal thickening and small pleural effusions greater on right may reflect changes of mild fluid overload given the diffuse third spacing of fluid. 3. Post cholecystectomy. 6 mm stone in gallbladder stump. 4. Atherosclerotic changes aorta. Post endoluminal stenting. Native aortic aneurysm measures up to 4.9 cm without change. 5. Complex fluid collection adjacent to the lateral border of the left psoas muscle without change. This measures up to 5 cm. Electronically Signed   By: Genia Del M.D.   On: 08/01/2018 17:34     ASSESSMENT  Ms. Langill is a 67 y.o. female  with  a pmh significant for CAD, COPD, AAA (s/p repair), GERD, Pancreatitis (s/p CCY) complicated by peripancreatic fluid collection/cyst development in HOP/Duodenal region, Cystic Duct stone.  The patient is clinically stable and compared to yesterday and from what she has been based on the notation in the chart much better than she has been the last week.  She was tolerating an oral diet and is adamant that she wants to be discharged  home.  I said I would discuss this with her medical service and they will determine whether from a kidney perspective and electrolytes and other medical issues with her she would be stable for discharge.  The longer we wait for the possibility of maturation of this cystic region the better for [place] prior to consideration of attempt at endoscopic drainage.  With that being said I would like to try and review with our radiologist today or tomorrow whether they feel comfortable that there is no evidence of a pseudoaneurysm prior to consideration of drainage.  She has a follow-up appointment scheduled with me for next week but we will push that out another week or so on Monday we will call and reschedule for the next week and I would like to speak with her and her daughters in clinic so that we can come up with a plan of action for this region.  Ideally I would have gone contrasted scan to evaluate for maturity of the cyst wall but that has not been able to be performed due to her kidney disease.  I would entertain an EUS to evaluate the wall and see if it is something that is amenable but ideally if we are going to do that we would do it with the intention of also treating at that same time point and thus I need to discuss this in more depth with the patient and daughters.  If she is discharged then we will plan to continue discussions as an outpatient.  As had previously been noted GI will not be providing any pain control for this patient on the long-term basis that we will go through pain service or through her primary care provider.   PLAN/RECOMMENDATIONS  Follow up in outpatient setting Consider EUS for evaluation of cyst wall maturity and for possible drainage Will plan to reschedule patient for another 1-2 weeks if possible (has appointment already scheduled for next week)   Please page/call with questions or concerns.   Justice Britain, MD [facility] Office #  [phone]    LOS: 8 days  Irving Copas  08/03/2018, 11:37 AM

## 2018-08-03 NOTE — Progress Notes (Signed)
Patient discharged to home, AVS reviewed with patient and daughter all questions answered. IV removed and prescriptions provided.

## 2018-08-07 ENCOUNTER — Telehealth: Payer: Self-pay

## 2018-08-07 NOTE — Telephone Encounter (Signed)
How about December 9 @400PM ? On December 10 @145 , I'm not sure if the currently scheduled patient will actually be coming as I remember calling and discussing that he may be moving from the region. If we can check if he is keeping appointment that may help. I would use either of those dates. If patient is feeling well overall and stable, then we can let her go out to December 31. Thanks for checking.

## 2018-08-07 NOTE — Telephone Encounter (Signed)
400 PM is fine on 12/9

## 2018-08-07 NOTE — Telephone Encounter (Signed)
We have you as unavailable at 4 pm, is it ok to use that spot?

## 2018-08-07 NOTE — Telephone Encounter (Signed)
The appt was moved to 12/9 at 4 pm.  Pt and her daughters are aware

## 2018-08-07 NOTE — Telephone Encounter (Signed)
Dr Rush Landmark you do not have any availability during the times requested.  Your next available is January.  Please advise.

## 2018-08-07 NOTE — Telephone Encounter (Signed)
-----   Message from Irving Copas., MD sent at 08/03/2018  1:20 PM EST ----- Regarding: Follow up [name], Let's see if we can move this patient's Follow up with me to the 2nd or 3rd week of December. I need her to present with her daughter(s) so that we can discuss next steps in evaluation and therapy. Thanks. Chester Holstein

## 2018-08-08 NOTE — Telephone Encounter (Signed)
Pt's daughter states she wants to speak with the nurse about why her appointment was changed again and that she was not notified of appointment change.

## 2018-08-08 NOTE — Telephone Encounter (Signed)
Left message for [name]  [phone] to call back

## 2018-08-09 ENCOUNTER — Ambulatory Visit: Payer: Self-pay | Admitting: Gastroenterology

## 2018-08-14 ENCOUNTER — Encounter (HOSPITAL_COMMUNITY): Payer: Self-pay | Admitting: Emergency Medicine

## 2018-08-14 ENCOUNTER — Inpatient Hospital Stay (HOSPITAL_COMMUNITY)
Admission: EM | Admit: 2018-08-14 | Discharge: 2018-08-22 | DRG: 682 | Disposition: A | Discharge status: Home Health | Payer: Medicare Other | Attending: Internal Medicine | Admitting: Internal Medicine

## 2018-08-14 ENCOUNTER — Other Ambulatory Visit (INDEPENDENT_AMBULATORY_CARE_PROVIDER_SITE_OTHER): Payer: Medicare Other

## 2018-08-14 ENCOUNTER — Encounter: Payer: Self-pay | Admitting: Gastroenterology

## 2018-08-14 ENCOUNTER — Emergency Department (HOSPITAL_COMMUNITY): Payer: Medicare Other

## 2018-08-14 ENCOUNTER — Ambulatory Visit (INDEPENDENT_AMBULATORY_CARE_PROVIDER_SITE_OTHER): Payer: Medicare Other | Admitting: Gastroenterology

## 2018-08-14 ENCOUNTER — Other Ambulatory Visit: Payer: Self-pay

## 2018-08-14 VITALS — BP 126/60 | HR 72 | Ht 66.0 in | Wt 140.0 lb

## 2018-08-14 DIAGNOSIS — J449 Chronic obstructive pulmonary disease, unspecified: Secondary | ICD-10-CM | POA: Diagnosis present

## 2018-08-14 DIAGNOSIS — R601 Generalized edema: Secondary | ICD-10-CM | POA: Diagnosis not present

## 2018-08-14 DIAGNOSIS — E1151 Type 2 diabetes mellitus with diabetic peripheral angiopathy without gangrene: Secondary | ICD-10-CM | POA: Diagnosis present

## 2018-08-14 DIAGNOSIS — N186 End stage renal disease: Secondary | ICD-10-CM | POA: Diagnosis present

## 2018-08-14 DIAGNOSIS — Z992 Dependence on renal dialysis: Secondary | ICD-10-CM

## 2018-08-14 DIAGNOSIS — J9611 Chronic respiratory failure with hypoxia: Secondary | ICD-10-CM | POA: Diagnosis present

## 2018-08-14 DIAGNOSIS — I5032 Chronic diastolic (congestive) heart failure: Secondary | ICD-10-CM | POA: Diagnosis present

## 2018-08-14 DIAGNOSIS — I252 Old myocardial infarction: Secondary | ICD-10-CM

## 2018-08-14 DIAGNOSIS — R0902 Hypoxemia: Secondary | ICD-10-CM

## 2018-08-14 DIAGNOSIS — K859 Acute pancreatitis without necrosis or infection, unspecified: Secondary | ICD-10-CM | POA: Diagnosis not present

## 2018-08-14 DIAGNOSIS — I132 Hypertensive heart and chronic kidney disease with heart failure and with stage 5 chronic kidney disease, or end stage renal disease: Secondary | ICD-10-CM | POA: Diagnosis present

## 2018-08-14 DIAGNOSIS — N184 Chronic kidney disease, stage 4 (severe): Secondary | ICD-10-CM

## 2018-08-14 DIAGNOSIS — I2510 Atherosclerotic heart disease of native coronary artery without angina pectoris: Secondary | ICD-10-CM | POA: Diagnosis present

## 2018-08-14 DIAGNOSIS — Z9981 Dependence on supplemental oxygen: Secondary | ICD-10-CM

## 2018-08-14 DIAGNOSIS — Z955 Presence of coronary angioplasty implant and graft: Secondary | ICD-10-CM

## 2018-08-14 DIAGNOSIS — Z888 Allergy status to other drugs, medicaments and biological substances status: Secondary | ICD-10-CM

## 2018-08-14 DIAGNOSIS — M109 Gout, unspecified: Secondary | ICD-10-CM | POA: Diagnosis present

## 2018-08-14 DIAGNOSIS — K8689 Other specified diseases of pancreas: Secondary | ICD-10-CM

## 2018-08-14 DIAGNOSIS — E876 Hypokalemia: Secondary | ICD-10-CM | POA: Diagnosis present

## 2018-08-14 DIAGNOSIS — G8929 Other chronic pain: Secondary | ICD-10-CM | POA: Diagnosis present

## 2018-08-14 DIAGNOSIS — Z885 Allergy status to narcotic agent status: Secondary | ICD-10-CM

## 2018-08-14 DIAGNOSIS — N179 Acute kidney failure, unspecified: Secondary | ICD-10-CM | POA: Diagnosis not present

## 2018-08-14 DIAGNOSIS — I10 Essential (primary) hypertension: Secondary | ICD-10-CM | POA: Diagnosis present

## 2018-08-14 DIAGNOSIS — I714 Abdominal aortic aneurysm, without rupture: Secondary | ICD-10-CM | POA: Diagnosis present

## 2018-08-14 DIAGNOSIS — I25119 Atherosclerotic heart disease of native coronary artery with unspecified angina pectoris: Secondary | ICD-10-CM | POA: Diagnosis present

## 2018-08-14 DIAGNOSIS — N189 Chronic kidney disease, unspecified: Secondary | ICD-10-CM

## 2018-08-14 DIAGNOSIS — K861 Other chronic pancreatitis: Secondary | ICD-10-CM | POA: Diagnosis present

## 2018-08-14 DIAGNOSIS — Z7902 Long term (current) use of antithrombotics/antiplatelets: Secondary | ICD-10-CM

## 2018-08-14 DIAGNOSIS — F17210 Nicotine dependence, cigarettes, uncomplicated: Secondary | ICD-10-CM | POA: Diagnosis present

## 2018-08-14 DIAGNOSIS — K219 Gastro-esophageal reflux disease without esophagitis: Secondary | ICD-10-CM | POA: Diagnosis present

## 2018-08-14 DIAGNOSIS — Z79899 Other long term (current) drug therapy: Secondary | ICD-10-CM

## 2018-08-14 DIAGNOSIS — Z91041 Radiographic dye allergy status: Secondary | ICD-10-CM

## 2018-08-14 DIAGNOSIS — N2581 Secondary hyperparathyroidism of renal origin: Secondary | ICD-10-CM | POA: Diagnosis present

## 2018-08-14 DIAGNOSIS — E1122 Type 2 diabetes mellitus with diabetic chronic kidney disease: Secondary | ICD-10-CM | POA: Diagnosis present

## 2018-08-14 DIAGNOSIS — Z951 Presence of aortocoronary bypass graft: Secondary | ICD-10-CM

## 2018-08-14 DIAGNOSIS — I480 Paroxysmal atrial fibrillation: Secondary | ICD-10-CM | POA: Diagnosis present

## 2018-08-14 DIAGNOSIS — I700 Atherosclerosis of aorta: Secondary | ICD-10-CM | POA: Diagnosis present

## 2018-08-14 DIAGNOSIS — J810 Acute pulmonary edema: Secondary | ICD-10-CM | POA: Diagnosis present

## 2018-08-14 DIAGNOSIS — D631 Anemia in chronic kidney disease: Secondary | ICD-10-CM | POA: Diagnosis present

## 2018-08-14 DIAGNOSIS — R109 Unspecified abdominal pain: Secondary | ICD-10-CM

## 2018-08-14 DIAGNOSIS — Z7951 Long term (current) use of inhaled steroids: Secondary | ICD-10-CM

## 2018-08-14 DIAGNOSIS — E785 Hyperlipidemia, unspecified: Secondary | ICD-10-CM | POA: Diagnosis present

## 2018-08-14 DIAGNOSIS — N185 Chronic kidney disease, stage 5: Secondary | ICD-10-CM

## 2018-08-14 LAB — BASIC METABOLIC PANEL
ANION GAP: 19 — AB (ref 5–15)
BUN: 122 mg/dL (ref 6–23)
BUN: 121 mg/dL — ABNORMAL HIGH (ref 8–23)
CALCIUM: 7.2 mg/dL — AB (ref 8.9–10.3)
CHLORIDE: 99 meq/L (ref 96–112)
CHLORIDE: 100 mmol/L (ref 98–111)
CO2: 22 mEq/L (ref 19–32)
CO2: 17 mmol/L — AB (ref 22–32)
CREATININE: 3.43 mg/dL — AB (ref 0.40–1.20)
Calcium: 7.3 mg/dL — ABNORMAL LOW (ref 8.4–10.5)
Creatinine, Ser: 3.57 mg/dL — ABNORMAL HIGH (ref 0.44–1.00)
GFR calc Af Amer: 15 mL/min — ABNORMAL LOW (ref >60)
GFR calc non Af Amer: 13 mL/min — ABNORMAL LOW (ref >60)
GFR: 14.24 mL/min — CL (ref >60.00)
GLUCOSE: 115 mg/dL — AB (ref 70–99)
Glucose, Bld: 202 mg/dL — ABNORMAL HIGH (ref 70–99)
Potassium: 2.7 mEq/L — CL (ref 3.5–5.1)
Potassium: 2.2 mmol/L — CL (ref 3.5–5.1)
Sodium: 137 mEq/L (ref 135–145)
Sodium: 136 mmol/L (ref 135–145)

## 2018-08-14 LAB — CBC
HEMATOCRIT: 27.4 % — AB (ref 36.0–46.0)
HEMOGLOBIN: 8.7 g/dL — AB (ref 12.0–15.0)
MCH: 28.3 pg (ref 26.0–34.0)
MCHC: 31.8 g/dL (ref 30.0–36.0)
MCV: 89.3 fL (ref 80.0–100.0)
NRBC: 0 % (ref 0.0–0.2)
Platelets: 259 10*3/uL (ref 150–400)
RBC: 3.07 MIL/uL — ABNORMAL LOW (ref 3.87–5.11)
RDW: 17.3 % — AB (ref 11.5–15.5)
WBC: 9.3 10*3/uL (ref 4.0–10.5)

## 2018-08-14 LAB — HEPATIC FUNCTION PANEL
ALBUMIN: 3.3 g/dL — AB (ref 3.5–5.2)
ALK PHOS: 39 U/L (ref 39–117)
ALT: 4 U/L (ref 0–35)
AST: 12 U/L (ref 0–37)
Bilirubin, Direct: 0.4 mg/dL — ABNORMAL HIGH (ref 0.0–0.3)
TOTAL PROTEIN: 6.4 g/dL (ref 6.0–8.3)
Total Bilirubin: 0.9 mg/dL (ref 0.2–1.2)

## 2018-08-14 LAB — HIGH SENSITIVITY CRP: CRP, High Sensitivity: 98.59 mg/L — ABNORMAL HIGH (ref 0.000–5.000)

## 2018-08-14 LAB — BRAIN NATRIURETIC PEPTIDE: B Natriuretic Peptide: 2774.6 pg/mL — ABNORMAL HIGH (ref 0.0–100.0)

## 2018-08-14 MED ORDER — HYDROMORPHONE HCL 2 MG PO TABS
2.0000 mg | ORAL_TABLET | Freq: Once | ORAL | Status: AC
  Administered 2018-08-14: 2 mg via ORAL
  Filled 2018-08-14: qty 1

## 2018-08-14 MED ORDER — POTASSIUM CHLORIDE 10 MEQ/100ML IV SOLN
10.0000 meq | Freq: Once | INTRAVENOUS | Status: AC
  Administered 2018-08-14: 10 meq via INTRAVENOUS
  Filled 2018-08-14: qty 100

## 2018-08-14 NOTE — ED Triage Notes (Signed)
Pt reports bilateral leg swelling for over the past week. Pt called in by doctors for potassium of 2.7. Pt has had a total of 2 dialysis sessions over the last few weeks. Pt was recently admitted in the hospital for acute encephalopathy.

## 2018-08-14 NOTE — ED Provider Notes (Signed)
[facility] EMERGENCY DEPARTMENT Provider Note   CSN: [id number] Arrival date & time: 08/14/18  1952     History   Chief Complaint Chief Complaint  Patient presents with  . Leg Swelling  . Abnormal Lab    HPI [name] is a 67 y.o. female.  Patient seen today for worsening leg swelling by her primary care provider.  Patient has had a known history of stage IV renal failure has been dialyzed recently but was told she probably would need continued dialysis.  Patient last had dialysis about 3 weeks ago.  She does have an AV fistula in the left arm.  Patient also followed by GI medicine for pancreatitis.  But the abdominal pain is doing okay.  Also followed by vascular surgery for abdominal aortic aneurysm.  Patient was referred in because her potassium was 2.7.  She was given 3 doses of Lasix today and also her Lasix was increased over the weekend.  Does have marked leg swelling.  No real shortness of breath.  Primary care doctor has referred her back to her nephrologist Dr. Posey Pronto.     Past Medical History:  Diagnosis Date  . AAA (abdominal aortic aneurysm) ([place]) 5/09   3.7x3.3 by u/s 2009  . Abnormal EKG    deep TW inversions chronic  . Anemia   . CAD (coronary artery disease)    a. CABG 03/2011 LIMA to LAD, SVG to OM, SVG to PDA. b. cath 06/25/2015 DES to SVG to rPDA, patent LIMA to LAD, patent SVG to OM  . carotid stenosis 5/09   S/p L CEA ;  60-79% bilat ICA by preCABG dopplers 7/12  . CHF (congestive heart failure) ([place])   . Chronic back pain    "all over" (06/24/2015)  . Chronic bronchitis ([place])    "get it pretty much q yr" (06/24/2015)  . CKD (chronic kidney disease)   . Complication of anesthesia 1966   "problem w/ether"  . COPD (chronic obstructive pulmonary disease) ([place])   . Depression   . GERD (gastroesophageal reflux disease)    With hiatal hernia  . GIB (gastrointestinal bleeding) 9/11   S/p EGD with cautery at [facility]  . Heart murmur   .  History of blood transfusion "a few times"   "all related to anemia" (06/24/2015)  . History of hiatal hernia   . Hyperlipidemia   . Hypertension   . Mitral regurgitation    3+ MR by intraoperative TEE;  s/p MV repair with Dr. Roxy Manns 7/12  . Myocardial infarction [facility]) 2012 "several"  . On home oxygen therapy    "2 liters at night; negative for sleep apnea"  . PAD (peripheral artery disease) (HCC)    Severe; s/p bilateral renal artery stents, moderate in-stent restenosis  . Pancreatitis 05/2018  . Paroxysmal atrial fibrillation (HCC)    coumadin;  echo 9/07: EF 60%, mild LVH;  s/p Cox Maze 7/12 with LAA clipping  . Subclavian artery stenosis, left (HCC)    stented by Dr. Trula Slade on 10/18 to help flow of her LIMA to LAD  . Type II diabetes mellitus [facility])     Patient Active Problem List   Diagnosis Date Noted  . Goals of care, counseling/discussion   . DNR (do not resuscitate) discussion   . AKI (acute kidney injury) ([place]) 07/26/2018  . Acute encephalopathy 07/24/2018  . Chronic pain 07/24/2018  . Malnutrition of moderate degree 07/13/2018  . Stage II pressure ulcer ([place]) 07/12/2018  .  Fluid collection at surgical site   . Intraabdominal fluid collection   . Cystic duct calculus   . RUQ pain 07/08/2018  . Pancreatic pseudocyst 07/04/2018  . 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 Urinary retention 07/04/2018  . S/P insertion of iliac artery stent 07/04/2018  . Endoleak post (EVAR) endovascular aneurysm repair ([place]) 07/04/2018  . Physical deconditioning   . Esophageal ulcer with bleeding   . Pressure injury of skin 06/16/2018  . Hematemesis   . Palliative care patient   . Palliative care by specialist   . Anasarca 06/07/2018  . Demand ischemia ([place]) 06/07/2018  . Protein-calorie malnutrition, severe 05/29/2018  . Type II diabetes mellitus with renal manifestations ([place]) 05/28/2018  . Pancreatitis, recurrent 05/28/2018  . Hypokalemia 05/28/2018  . SIRS (systemic  inflammatory response syndrome) ([place]) 05/28/2018  . Pancreatitis 05/28/2018  . Volume overload 04/17/2018  . Anemia due to GI blood loss 04/14/2018  . Acute gallstone pancreatitis 04/14/2018  . Acute cholecystitis 04/14/2018  . Gastrointestinal hemorrhage   . Fatigue associated with anemia 04/09/2018  . Hypoglycemia 01/19/2018  . Type II diabetes mellitus ([place]) 01/19/2018  . Nausea with vomiting 01/19/2018  . Uncontrolled hypertension 01/19/2018  . Hyperlipidemia 01/19/2018  . CAD (coronary artery disease) 01/19/2018  . COPD with hypoxia ([place]) 01/19/2018  . Chronic kidney disease (CKD), stage IV (severe) ([place]) 01/19/2018  . Retroperitoneal hematoma 01/19/2018  . Acute blood loss anemia 01/19/2018  . Chronic diastolic congestive heart failure ([place]) 01/19/2018  . AAA (abdominal aortic aneurysm) ([place]) 01/16/2018  . Elevated brain natriuretic peptide (BNP) level 10/07/2015  . Diabetes mellitus ([place]) 10/07/2015  . Pain in the chest   . PAD (peripheral artery disease) ([place]) 07/29/2015  . Chest pain 07/04/2015  . Acute renal failure superimposed on stage 4 chronic kidney disease ([place]) 07/04/2015  . GERD (gastroesophageal reflux disease) 07/04/2015  . Anemia 06/26/2015  . Coronary artery disease involving autologous vein coronary bypass graft with unstable angina pectoris ([place]) 06/24/2015  . Angina at rest [facility])   . Subclavian artery stenosis, left ([place]) 06/23/2015  . AAA (abdominal aortic aneurysm) without rupture ([place]) 12/10/2014  . Subclavian steal syndrome 12/10/2014  . Renal artery stenosis ([place]) 09/04/2013  . Carotid artery stenosis 09/28/2012  . Mitral valve disorder 06/23/2011  . S/P CABG x 3 06/16/2011  . S/P Maze operation for atrial fibrillation 06/16/2011  . S/P MVR (mitral valve repair) 06/16/2011  . Follow-up examination following surgery 03/29/2011  . CKD (chronic kidney disease) 03/03/2011  . COPD (chronic obstructive pulmonary disease) ([place]) 03/03/2011  . Chronic  diastolic heart failure ([place]) 02/26/2011  . Hypotension 02/26/2011  . NSTEMI (non-ST elevated myocardial infarction) ([place]) 02/26/2011  . Long term (current) use of anticoagulants 12/17/2010  . OBSTRUCTIVE SLEEP APNEA 09/04/2010  . HYPOXEMIA 09/04/2010  . SNORING 08/05/2010  . Atrial fibrillation ([place]) 12/09/2009  . WEIGHT LOSS 03/28/2009  . PALPITATIONS 03/28/2009  . PVD 11/21/2008  . HYPERLIPIDEMIA-MIXED 11/20/2008  . TOBACCO ABUSE 11/20/2008  . Essential hypertension 11/20/2008  . Coronary artery disease involving native heart with angina pectoris ([place]) 11/20/2008  . ABDOMINAL AORTIC ANEURYSM 11/20/2008    Past Surgical History:  Procedure Laterality Date  . A/V FISTULAGRAM Left 03/21/2018   Procedure: A/V FISTULAGRAM;  Surgeon: Serafina Mitchell, MD;  Location: [place] CV LAB;  Service: Cardiovascular;  Laterality: Left;  . ABDOMINAL AORTIC ENDOVASCULAR STENT GRAFT N/A 01/17/2018   Procedure: ABDOMINAL AORTIC ENDOVASCULAR STENT GRAFT WITH CO2;  Surgeon: Serafina Mitchell, MD;  Location: [facility];  Service: Vascular;  Laterality: N/A;  . ABDOMINAL HYSTERECTOMY  1990's  . APPENDECTOMY  Aug. 11, 2016   Ruptured  . AV FISTULA PLACEMENT Left 11/03/2017   Procedure: ARTERIOVENOUS (AV) FISTULA CREATION LEFT ARM;  Surgeon: Serafina Mitchell, MD;  Location: [place];  Service: Vascular;  Laterality: Left;  . BOWEL RESECTION  2013  . CARDIAC CATHETERIZATION N/A 06/24/2015   Procedure: Left Heart Cath and Cors/Grafts Angiography;  Surgeon: Leonie Man, MD;  Location: [place] CV LAB;  Service: Cardiovascular;  Laterality: N/A;  . CARDIAC CATHETERIZATION N/A 06/25/2015   Procedure: Coronary Stent Intervention;  Surgeon: Sherren Mocha, MD;  Location: [place] CV LAB;  Service: Cardiovascular;  Laterality: N/A;  . CAROTID ENDARTERECTOMY Left   . CATARACT EXTRACTION Right 03/27/2018  . CHOLECYSTECTOMY N/A 04/19/2018   Procedure: LAPAROSCOPIC CHOLECYSTECTOMY WITH INTRAOPERATIVE  CHOLANGIOGRAM;  Surgeon: Rolm Bookbinder, MD;  Location: [facility];  Service: General;  Laterality: N/A;  . CORONARY ANGIOPLASTY    . CORONARY ARTERY BYPASS GRAFT  03/05/2011   CABG X3 (LIMA to LAD, SVG to OM, SVG to PDA, EVH via left thigh  . CORONARY STENT PLACEMENT    . ESOPHAGOGASTRODUODENOSCOPY N/A 04/12/2018   Procedure: ESOPHAGOGASTRODUODENOSCOPY (EGD);  Surgeon: [name], MD;  Location: [facility] ENDOSCOPY;  Service: Endoscopy;  Laterality: N/A;  . ESOPHAGOGASTRODUODENOSCOPY N/A 06/16/2018   Procedure: ESOPHAGOGASTRODUODENOSCOPY (EGD);  Surgeon: Thornton Park, MD;  Location: [facility];  Service: Gastroenterology;  Laterality: N/A;  . LACERATION REPAIR Right 1990's   WRIST  . MAZE  03/05/2011   complete biatrial lesion set with clipping of LA appendage  . MITRAL VALVE REPAIR  03/05/2011   82m Memo 3D ring annuloplasty for ischemic MR  . PERIPHERAL VASCULAR BALLOON ANGIOPLASTY Left 03/21/2018   Procedure: PERIPHERAL VASCULAR BALLOON ANGIOPLASTY;  Surgeon: BSerafina Mitchell MD;  [place] [place];  Service: Cardiovascular;  Laterality: Left;  . PERIPHERAL VASCULAR CATHETERIZATION N/A 06/24/2015   Procedure: Aortic Arch Angiography;  Surgeon: VSerafina Mitchell MD;  [place] [place] [facility];  Service: Cardiovascular;  Laterality: N/A;  . PERIPHERAL VASCULAR CATHETERIZATION  06/24/2015   Procedure: Peripheral Vascular Intervention;  Surgeon: VSerafina Mitchell MD;  [place];  Service: Cardiovascular;;  . PERIPHERAL VASCULAR CATHETERIZATION N/A 06/24/2015   Procedure: Abdominal Aortogram;  Surgeon: VSerafina Mitchell MD;  [facility];  Service: Cardiovascular;  Laterality: N/A;  . RENAL ANGIOGRAM Bilateral 09/11/2013   Procedure: RENAL ANGIOGRAM;  Surgeon: VSerafina Mitchell MD;  [facility];  Service: Cardiovascular;  Laterality: Bilateral;  . RIGHT FEMORAL-POPLITEAL BYPASS       OB History   None      Home Medications     Prior to Admission medications   Medication Sig Start Date End Date Taking? Authorizing Provider  acetaminophen (TYLENOL) 325 MG tablet Take 325 mg by mouth every 6 (six) hours as needed (for pain).   Yes [provider]  albuterol (PROVENTIL HFA;VENTOLIN HFA) 108 (90 Base) MCG/ACT inhaler Inhale 2 puffs into the lungs every 4 (four) hours as needed for wheezing or shortness of breath. ProAir 10/08/15  Yes SDelfina Redwood MD  allopurinol (ZYLOPRIM) 100 MG tablet Take 100 mg by mouth daily. 02/02/17  Yes [provider]  amLODipine (NORVASC) 10 MG tablet Take 10 mg by mouth daily.     Yes [provider]  atorvastatin (LIPITOR) 40 MG tablet Take 40 mg by mouth daily.  12/07/17  Yes [provider]  budesonide-formoterol (SYMBICORT) 160-4.5 MCG/ACT inhaler Inhale 2 puffs into the lungs 2 (two) times daily as needed (for flares).   Yes [provider]  carvedilol (COREG) 12.5 MG tablet Take 1 tablet (12.5 mg total) by mouth 2 (two) times daily with a meal. 06/26/18  Yes Thurnell Lose, MD  Cholecalciferol (VITAMIN D3) 5000 units TABS Take 5,000 Units by mouth daily.   Yes [provider]  clopidogrel (PLAVIX) 75 MG tablet Take 1 tablet (75 mg total) by mouth daily with breakfast. 06/26/15  Yes Almyra Deforest, PA  Cyanocobalamin (VITAMIN B-12) 5000 MCG SUBL Place 5,000 mcg under the tongue daily.   Yes [provider]  fenofibrate 160 MG tablet Take 1 tablet (160 mg total) by mouth daily. 10/08/15  Yes Delfina Redwood, MD  ferrous sulfate 325 (65 FE) MG tablet Take 1 tablet (325 mg total) by mouth daily with breakfast. 07/19/18  Yes Florencia Reasons, MD  furosemide (LASIX) 40 MG tablet Take 1 tablet (40 mg total) by mouth daily as needed (weight gain). 08/03/18  Yes Patrecia Pour, MD  gabapentin (NEURONTIN) 300 MG capsule Take 1 capsule (300 mg total) by mouth daily. TAKE 1 CAPSULE(300 MG) BY MOUTH TWICE DAILY Patient taking differently: Take 300  mg by mouth 2 (two) times daily.  08/03/18  Yes Patrecia Pour, MD  hydrALAZINE (APRESOLINE) 25 MG tablet Take 1 tablet (25 mg total) by mouth every 8 (eight) hours. 06/26/18  Yes Thurnell Lose, MD  HYDROmorphone (DILAUDID) 2 MG tablet Take 2 mg by mouth every 4 (four) hours as needed for severe pain.   Yes [provider]  lipase/protease/amylase (CREON) 12000 units CPEP capsule Take 1 capsule (12,000 Units total) by mouth 3 (three) times daily with meals. 08/03/18  Yes Patrecia Pour, MD  loperamide (IMODIUM A-D) 2 MG tablet Take 2 mg by mouth 4 (four) times daily as needed for diarrhea or loose stools.   Yes [provider]  NARCAN 4 MG/0.1ML LIQD nasal spray kit Place 1 spray into the nose once.  07/21/18  Yes [provider]  pantoprazole (PROTONIX) 40 MG tablet Take 1 tablet (40 mg total) by mouth daily. 08/03/18  Yes Patrecia Pour, MD  polyethylene glycol (MIRALAX / GLYCOLAX) packet Take 17 g by mouth daily as needed for mild constipation. 06/26/18  Yes Thurnell Lose, MD  Amino Acids-Protein Hydrolys (FEEDING SUPPLEMENT, PRO-STAT SUGAR FREE 64,) LIQD Take 30 mLs by mouth 3 (three) times daily with meals. 06/26/18   Thurnell Lose, MD  feeding supplement (BOOST / RESOURCE BREEZE) LIQD Take 1 Container by mouth 2 (two) times daily.    [provider]  vitamin B-12 100 MCG tablet Take 1 tablet (100 mcg total) by mouth daily. Patient not taking: Reported on 08/14/2018 06/26/18   Thurnell Lose, MD    Family History Family History  Problem Relation Age of Onset  . Emphysema Mother   . Heart disease Mother        before age 68  . Hypertension Mother   . Hyperlipidemia Mother   . Heart attack Mother   . AAA (abdominal aortic aneurysm) Mother        rupture  . Heart attack Father 51  . Emphysema Father   . Heart disease Father        before age 65  . Hyperlipidemia Father   . Hypertension Father   . Peripheral vascular disease Father   .  Diabetes Brother   . Heart disease Brother        before age 51  . Hyperlipidemia Brother   . Hypertension Brother   . Heart attack Brother        CABG  . Heart disease Other        Vascular disease in grandparents, uncles and dad  . Colon cancer Neg Hx     Social History Social History   Tobacco Use  . Smoking status: Current Every Day Smoker    Years: 50.00    Types: Cigarettes  . Smokeless tobacco: Never Used  . Tobacco comment: 3/4 pk per day  Substance Use Topics  . Alcohol use: Yes    Alcohol/week: 0.0 standard drinks    Comment: occasionally  . Drug use: Yes    Types: Marijuana     Allergies   Isosorbide; Isosorbide nitrate; Oxycodone-acetaminophen; Codeine; Contrast media [iodinated diagnostic agents]; Ioxaglate; Metrizamide; and Percocet [oxycodone-acetaminophen]   Review of Systems Review of Systems  Constitutional: Negative for fever.  HENT: Negative for congestion.   Respiratory: Negative for shortness of breath.   Cardiovascular: Positive for leg swelling.  Gastrointestinal: Negative for abdominal pain.  Genitourinary: Negative for dysuria.  Musculoskeletal: Negative for back pain.  Skin: Negative for rash.  Neurological: Negative for headaches.  Hematological: Does not bruise/bleed easily.  Psychiatric/Behavioral: Negative for confusion.     Physical Exam Updated Vital Signs BP (!) 142/48   Pulse 63   Temp 98.3 F (36.8 C) (Oral)   Resp 17   SpO2 96%   Physical Exam  Constitutional: She is oriented to person, place, and time. She appears well-developed and well-nourished. No distress.  HENT:  Head: Normocephalic and atraumatic.  Eyes: Pupils are equal, round, and reactive to light. Conjunctivae and EOM are normal.  Neck: Neck supple.  Cardiovascular: Normal rate, regular rhythm and normal heart sounds.  Pulmonary/Chest: Effort normal and breath sounds normal. No respiratory distress. She has no rales.  Abdominal: Soft. Bowel sounds are  normal. There is no tenderness.  Musculoskeletal: Normal range of motion. She exhibits edema.  AV fistula left upper extremity.  With good thrill.  Marked lower extremity edema all the way into the thighs.  Bilateral.  Neurological: She is alert and oriented to person, place, and time. No cranial nerve deficit or sensory deficit. She exhibits normal muscle tone. Coordination normal.  Skin: Skin is warm. No rash noted.  Nursing note and vitals reviewed.    ED Treatments / Results  Labs (all labs ordered are listed, but only abnormal results are displayed) Labs Reviewed  CBC - Abnormal; Notable for the following components:      Result Value   RBC 3.07 (*)    Hemoglobin 8.7 (*)    HCT 27.4 (*)    RDW 17.3 (*)    All other components within normal limits  BASIC METABOLIC PANEL - Abnormal; Notable for the following components:   Potassium 2.2 (*)    CO2 17 (*)    Glucose, Bld 115 (*)    BUN 121 (*)    Creatinine, Ser 3.57 (*)    Calcium 7.2 (*)    GFR calc non Af Amer 13 (*)    GFR calc Af Amer 15 (*)    Anion gap 19 (*)    All other components within normal limits  BRAIN NATRIURETIC PEPTIDE - Abnormal; Notable for the following components:   B Natriuretic Peptide 2,774.6 (*)    All other components within  normal limits    EKG None  Radiology Dg Chest 2 View  Result Date: 08/14/2018 CLINICAL DATA:  Initial evaluation for market leg swelling. EXAM: CHEST - 2 VIEW COMPARISON:  Prior radiograph from 07/24/2018. FINDINGS: Median sternotomy wires underlying CABG markers, valvular prosthesis, and left atrial appendage clip noted. Vascular stent at the upper left mediastinum. Severe cardiomegaly, stable. Mediastinal silhouette within normal limits. Aortic atherosclerosis. Lungs mildly hypoinflated. Diffuse vascular congestion with interstitial prominence compatible with diffuse pulmonary interstitial edema. Superimposed mild bibasilar subsegmental atelectasis. No consolidative opacity.  No pleural effusion. No pneumothorax. No acute osseous abnormality. IMPRESSION: 1. Cardiomegaly with mild diffuse pulmonary interstitial edema. 2. Sequelae of prior CABG. 3. Aortic atherosclerosis. Electronically Signed   By: Jeannine Boga M.D.   On: 08/14/2018 22:26    Procedures Procedures (including critical care time)CRITICAL CARE Performed by: Fredia Sorrow Total critical care time: 30 minutes Critical care time was exclusive of separately billable procedures and treating other patients. Critical care was necessary to treat or prevent imminent or life-threatening deterioration. Critical care was time spent personally by me on the following activities: development of treatment plan with patient and/or surrogate as well as nursing, discussions with consultants, evaluation of patient's response to treatment, examination of patient, obtaining history from patient or surrogate, ordering and performing treatments and interventions, ordering and review of laboratory studies, ordering and review of radiographic studies, pulse oximetry and re-evaluation of patient's condition.   Medications Ordered in ED Medications  potassium chloride 10 mEq in 100 mL IVPB (has no administration in time range)  HYDROmorphone (DILAUDID) tablet 2 mg (2 mg Oral Given 08/14/18 2208)     Initial Impression / Assessment and Plan / ED Course  I have reviewed the triage vital signs and the nursing notes.  Pertinent labs & imaging results that were available during my care of the patient were reviewed by me and considered in my medical decision making (see chart for details).     Patient's labs here confirm the significant renal failure.  Potassium is even lower at 2.2.  Feel that this is going to require supplementation.  10 mEq of IV potassium ordered.  However with patient's renal function she probably will not require much.  Patient will require admission and cardiac monitoring.  Consult out to nephrology.   It does appear that patient may end up requiring dialysis she is showing some evidence of acidosis.  Also patient's primary care doctor is with [facility].  Patient would be an unassigned admission.  Will discuss with internal medicine.  Patient's chest x-ray without any significant fluid overload.  Patient not in any respiratory difficulty.  Final Clinical Impressions(s) / ED Diagnoses   Final diagnoses:  Hypokalemia  Acute renal failure superimposed on stage 4 chronic kidney disease, unspecified acute renal failure type [facility])    ED Discharge Orders    None       Fredia Sorrow, MD 08/14/18 2340

## 2018-08-14 NOTE — ED Notes (Signed)
Patient transported to X-ray 

## 2018-08-14 NOTE — Progress Notes (Signed)
Patient seen in clinic This afternoon for follow-up of pancreatitis and pancreatic fluid collection. She was recently hospitalized with acute on chronic renal insufficiency requiring hemodialysis. She's planned for follow up later this week with Dr. Posey Pronto of nephrology. She has had increasing anasarca since her discharge and her primary care provider in [facility] been increasing her Lasix. Care everywhere had her last potassium at 3 .0 however our repeat CMP here today shows a potassium of 2.7 and her blood urea nitrogen has increased to 122 Which has doubled since her discharge. I suspect this is from her Lasix diuretics however I am concerned about cardiac arrhythmias with such a low potassium. I have called the patient and her daughters and they agreed to bring her in for further evaluation, IV potassium, decision about observation versus admission in the settin g of progressive anasarca. I have called and spoken with the charge nurse of [facility]. For now not planning any type of intervention to pancreas and continuing her current dose of pain medication as she is tolerating a diet and she is not having significant pain.  [facility]

## 2018-08-14 NOTE — Patient Instructions (Signed)
If you are age 65 or older, your body mass index should be between 23-30. Your Body mass index is 22.6 kg/m. If this is out of the aforementioned range listed, please consider follow up with your Primary Care Provider.  If you are age 60 or younger, your body mass index should be between 19-25. Your Body mass index is 22.6 kg/m. If this is out of the aformentioned range listed, please consider follow up with your Primary Care Provider.   Your provider has requested that you go to the basement level for lab work before leaving today. Press "B" on the elevator. The lab is located at the first door on the left as you exit the elevator.  You have been scheduled for a CT scan of the abdomen  at [place] ([address]---this is in the same building as Press [facility]).   You are scheduled on               at       You should arrive 15 minutes prior to your appointment time for registration. Please follow the written instructions below on the day of your exam:  WARNING: IF YOU ARE ALLERGIC TO IODINE/X-RAY DYE, PLEASE NOTIFY RADIOLOGY IMMEDIATELY AT [phone]! YOU WILL BE GIVEN A 13 HOUR PREMEDICATION PREP.  1) Do not eat or drink anything after     (4 hours prior to your test) 2) You have been given 2 bottles of oral contrast to drink. The solution may taste better if refrigerated, but do NOT add ice or any other liquid to this solution. Shake well before drinking.    Drink 1 bottle of contrast @     (2 hours prior to your exam)  Drink 1 bottle of contrast @       (1 hour prior to your exam)  You may take any medications as prescribed with a small amount of water, if necessary. If you take any of the following medications: METFORMIN, GLUCOPHAGE, GLUCOVANCE, AVANDAMET, RIOMET, FORTAMET, Unity MET, JANUMET, GLUMETZA or METAGLIP, you MAY be asked to HOLD this medication 48 hours AFTER the exam.  The purpose of you drinking the oral contrast is to aid in the visualization of  your intestinal tract. The contrast solution may cause some diarrhea. Depending on your individual set of symptoms, you may also receive an intravenous injection of x-ray contrast/dye. Plan on being at [facility] for 30 minutes or longer, depending on the type of exam you are having performed.  This test typically takes 30-45 minutes to complete.  If you have any questions regarding your exam or if you need to reschedule, you may call the CT department at [phone] between the hours of 8:00 am and 5:00 pm, Monday-Friday.  ________________________________________________________________________  Will call you tomorrow regarding CT appointment.  Follow up with me on September 13, 2018 at 2:30 pm  Thank you for choosing me and [facility].   Justice Britain, MD

## 2018-08-15 ENCOUNTER — Other Ambulatory Visit: Payer: Self-pay

## 2018-08-15 ENCOUNTER — Encounter (HOSPITAL_COMMUNITY): Payer: Self-pay | Admitting: Internal Medicine

## 2018-08-15 DIAGNOSIS — N179 Acute kidney failure, unspecified: Secondary | ICD-10-CM | POA: Diagnosis not present

## 2018-08-15 DIAGNOSIS — J810 Acute pulmonary edema: Secondary | ICD-10-CM | POA: Diagnosis not present

## 2018-08-15 DIAGNOSIS — N189 Chronic kidney disease, unspecified: Secondary | ICD-10-CM | POA: Diagnosis not present

## 2018-08-15 DIAGNOSIS — N185 Chronic kidney disease, stage 5: Secondary | ICD-10-CM

## 2018-08-15 DIAGNOSIS — E876 Hypokalemia: Secondary | ICD-10-CM | POA: Diagnosis not present

## 2018-08-15 DIAGNOSIS — K859 Acute pancreatitis without necrosis or infection, unspecified: Secondary | ICD-10-CM

## 2018-08-15 DIAGNOSIS — I10 Essential (primary) hypertension: Secondary | ICD-10-CM | POA: Diagnosis not present

## 2018-08-15 LAB — URINALYSIS, ROUTINE W REFLEX MICROSCOPIC
BILIRUBIN URINE: NEGATIVE
GLUCOSE, UA: NEGATIVE mg/dL
Hgb urine dipstick: NEGATIVE
Ketones, ur: NEGATIVE mg/dL
Leukocytes, UA: NEGATIVE
Nitrite: NEGATIVE
Protein, ur: NEGATIVE mg/dL
Specific Gravity, Urine: 1.006 (ref 1.005–1.030)
pH: 5 (ref 5.0–8.0)

## 2018-08-15 LAB — IRON AND TIBC
Iron: 19 ug/dL — ABNORMAL LOW (ref 28–170)
Saturation Ratios: 6 % — ABNORMAL LOW (ref 10.4–31.8)
TIBC: 295 ug/dL (ref 250–450)
UIBC: 276 ug/dL

## 2018-08-15 LAB — BASIC METABOLIC PANEL
Anion gap: 18 — ABNORMAL HIGH (ref 5–15)
BUN: 122 mg/dL — ABNORMAL HIGH (ref 8–23)
CO2: 19 mmol/L — ABNORMAL LOW (ref 22–32)
Calcium: 7.1 mg/dL — ABNORMAL LOW (ref 8.9–10.3)
Chloride: 101 mmol/L (ref 98–111)
Creatinine, Ser: 3.68 mg/dL — ABNORMAL HIGH (ref 0.44–1.00)
GFR calc Af Amer: 14 mL/min — ABNORMAL LOW (ref >60)
GFR, EST NON AFRICAN AMERICAN: 12 mL/min — AB (ref >60)
Glucose, Bld: 119 mg/dL — ABNORMAL HIGH (ref 70–99)
POTASSIUM: 2.1 mmol/L — AB (ref 3.5–5.1)
Sodium: 138 mmol/L (ref 135–145)

## 2018-08-15 LAB — HEPATIC FUNCTION PANEL
ALT: 7 U/L (ref 0–44)
AST: 14 U/L — ABNORMAL LOW (ref 15–41)
Albumin: 2.4 g/dL — ABNORMAL LOW (ref 3.5–5.0)
Alkaline Phosphatase: 35 U/L — ABNORMAL LOW (ref 38–126)
BILIRUBIN INDIRECT: 0.7 mg/dL (ref 0.3–0.9)
Bilirubin, Direct: 0.4 mg/dL — ABNORMAL HIGH (ref 0.0–0.2)
Total Bilirubin: 1.1 mg/dL (ref 0.3–1.2)
Total Protein: 5.4 g/dL — ABNORMAL LOW (ref 6.5–8.1)

## 2018-08-15 LAB — CBC WITH DIFFERENTIAL/PLATELET
Abs Immature Granulocytes: 0.05 10*3/uL (ref 0.00–0.07)
Basophils Absolute: 0.1 10*3/uL (ref 0.0–0.1)
Basophils Relative: 1 %
Eosinophils Absolute: 0.1 10*3/uL (ref 0.0–0.5)
Eosinophils Relative: 1 %
HCT: 24.9 % — ABNORMAL LOW (ref 36.0–46.0)
Hemoglobin: 7.8 g/dL — ABNORMAL LOW (ref 12.0–15.0)
Immature Granulocytes: 1 %
LYMPHS PCT: 2 %
Lymphs Abs: 0.2 10*3/uL — ABNORMAL LOW (ref 0.7–4.0)
MCH: 28 pg (ref 26.0–34.0)
MCHC: 31.3 g/dL (ref 30.0–36.0)
MCV: 89.2 fL (ref 80.0–100.0)
Monocytes Absolute: 0.6 10*3/uL (ref 0.1–1.0)
Monocytes Relative: 6 %
NEUTROS ABS: 8.3 10*3/uL — AB (ref 1.7–7.7)
Neutrophils Relative %: 89 %
Platelets: 258 10*3/uL (ref 150–400)
RBC: 2.79 MIL/uL — AB (ref 3.87–5.11)
RDW: 17.2 % — ABNORMAL HIGH (ref 11.5–15.5)
WBC: 9.3 10*3/uL (ref 4.0–10.5)
nRBC: 0 % (ref 0.0–0.2)

## 2018-08-15 LAB — LIPASE, BLOOD: Lipase: 106 U/L — ABNORMAL HIGH (ref 11–51)

## 2018-08-15 LAB — MAGNESIUM: Magnesium: 0.8 mg/dL — CL (ref 1.7–2.4)

## 2018-08-15 MED ORDER — POLYETHYLENE GLYCOL 3350 17 G PO PACK: 17.0000 g | PACK | Freq: Every day | ORAL | Status: DC | PRN

## 2018-08-15 MED ORDER — ALTEPLASE 2 MG IJ SOLR
2.0000 mg | Freq: Once | INTRAMUSCULAR | Status: DC | PRN
  Filled 2018-08-15: qty 2

## 2018-08-15 MED ORDER — HYDRALAZINE HCL 25 MG PO TABS
25.0000 mg | ORAL_TABLET | Freq: Three times a day (TID) | ORAL | Status: DC
  Administered 2018-08-15 – 2018-08-22 (×16): 25 mg via ORAL
  Filled 2018-08-15 (×18): qty 1

## 2018-08-15 MED ORDER — GABAPENTIN 300 MG PO CAPS
300.0000 mg | ORAL_CAPSULE | Freq: Every day | ORAL | Status: DC
  Administered 2018-08-15 – 2018-08-16 (×2): 300 mg via ORAL
  Filled 2018-08-15 (×2): qty 1

## 2018-08-15 MED ORDER — POTASSIUM CHLORIDE CRYS ER 20 MEQ PO TBCR
40.0000 meq | EXTENDED_RELEASE_TABLET | Freq: Once | ORAL | Status: AC
  Administered 2018-08-15: 40 meq via ORAL
  Filled 2018-08-15: qty 2

## 2018-08-15 MED ORDER — SODIUM CHLORIDE 0.9 % IV SOLN: 100.0000 mL | INTRAVENOUS | Status: DC | PRN

## 2018-08-15 MED ORDER — LIDOCAINE HCL (PF) 1 % IJ SOLN: 5.0000 mL | INTRAMUSCULAR | Status: DC | PRN

## 2018-08-15 MED ORDER — AMLODIPINE BESYLATE 10 MG PO TABS
10.0000 mg | ORAL_TABLET | Freq: Every day | ORAL | Status: DC
  Administered 2018-08-15 – 2018-08-21 (×7): 10 mg via ORAL
  Filled 2018-08-15 (×6): qty 1
  Filled 2018-08-15: qty 2

## 2018-08-15 MED ORDER — CLOPIDOGREL BISULFATE 75 MG PO TABS
75.0000 mg | ORAL_TABLET | Freq: Every day | ORAL | Status: DC
  Administered 2018-08-15 – 2018-08-22 (×8): 75 mg via ORAL
  Filled 2018-08-15 (×8): qty 1

## 2018-08-15 MED ORDER — ATORVASTATIN CALCIUM 40 MG PO TABS
40.0000 mg | ORAL_TABLET | Freq: Every day | ORAL | Status: DC
  Administered 2018-08-15 – 2018-08-22 (×8): 40 mg via ORAL
  Filled 2018-08-15 (×8): qty 1

## 2018-08-15 MED ORDER — FERROUS SULFATE 325 (65 FE) MG PO TABS
325.0000 mg | ORAL_TABLET | Freq: Every day | ORAL | Status: DC
  Administered 2018-08-15 – 2018-08-16 (×2): 325 mg via ORAL
  Filled 2018-08-15 (×2): qty 1

## 2018-08-15 MED ORDER — PENTAFLUOROPROP-TETRAFLUOROETH EX AERO: 1.0000 "application " | INHALATION_SPRAY | CUTANEOUS | Status: DC | PRN

## 2018-08-15 MED ORDER — PANTOPRAZOLE SODIUM 40 MG PO TBEC
40.0000 mg | DELAYED_RELEASE_TABLET | Freq: Every day | ORAL | Status: DC
  Administered 2018-08-15 – 2018-08-22 (×9): 40 mg via ORAL
  Filled 2018-08-15 (×8): qty 1

## 2018-08-15 MED ORDER — VITAMIN B-12 1000 MCG PO TABS
1000.0000 ug | ORAL_TABLET | Freq: Every day | ORAL | Status: DC
  Administered 2018-08-15 – 2018-08-22 (×8): 1000 ug via ORAL
  Filled 2018-08-15 (×8): qty 1

## 2018-08-15 MED ORDER — CALCIUM CARBONATE ANTACID 500 MG PO CHEW
1.0000 | CHEWABLE_TABLET | Freq: Four times a day (QID) | ORAL | Status: DC | PRN
  Administered 2018-08-15 – 2018-08-20 (×4): 200 mg via ORAL
  Filled 2018-08-15 (×5): qty 1

## 2018-08-15 MED ORDER — HEPARIN SODIUM (PORCINE) 5000 UNIT/ML IJ SOLN
5000.0000 [IU] | Freq: Three times a day (TID) | INTRAMUSCULAR | Status: DC
  Administered 2018-08-15 – 2018-08-18 (×9): 5000 [IU] via SUBCUTANEOUS
  Filled 2018-08-15 (×11): qty 1

## 2018-08-15 MED ORDER — LIDOCAINE-PRILOCAINE 2.5-2.5 % EX CREA: 1.0000 "application " | TOPICAL_CREAM | CUTANEOUS | Status: DC | PRN

## 2018-08-15 MED ORDER — CHLORHEXIDINE GLUCONATE CLOTH 2 % EX PADS: 6.0000 | MEDICATED_PAD | Freq: Every day | CUTANEOUS | Status: DC

## 2018-08-15 MED ORDER — HYDROMORPHONE HCL 2 MG PO TABS
2.0000 mg | ORAL_TABLET | ORAL | Status: DC | PRN
  Administered 2018-08-15 – 2018-08-21 (×19): 2 mg via ORAL
  Filled 2018-08-15 (×21): qty 1

## 2018-08-15 MED ORDER — MOMETASONE FURO-FORMOTEROL FUM 200-5 MCG/ACT IN AERO
2.0000 | INHALATION_SPRAY | Freq: Two times a day (BID) | RESPIRATORY_TRACT | Status: DC
  Administered 2018-08-15 – 2018-08-21 (×13): 2 via RESPIRATORY_TRACT
  Filled 2018-08-15 (×2): qty 8.8

## 2018-08-15 MED ORDER — FENOFIBRATE 160 MG PO TABS
160.0000 mg | ORAL_TABLET | Freq: Every day | ORAL | Status: DC
  Administered 2018-08-15 – 2018-08-22 (×8): 160 mg via ORAL
  Filled 2018-08-15 (×8): qty 1

## 2018-08-15 MED ORDER — ALLOPURINOL 100 MG PO TABS
100.0000 mg | ORAL_TABLET | Freq: Every day | ORAL | Status: DC
  Administered 2018-08-15 – 2018-08-22 (×7): 100 mg via ORAL
  Filled 2018-08-15 (×8): qty 1

## 2018-08-15 MED ORDER — CARVEDILOL 12.5 MG PO TABS
12.5000 mg | ORAL_TABLET | Freq: Two times a day (BID) | ORAL | Status: DC
  Administered 2018-08-15 – 2018-08-22 (×12): 12.5 mg via ORAL
  Filled 2018-08-15 (×12): qty 1

## 2018-08-15 MED ORDER — PRO-STAT SUGAR FREE PO LIQD
30.0000 mL | Freq: Three times a day (TID) | ORAL | Status: DC
  Administered 2018-08-15 – 2018-08-22 (×16): 30 mL via ORAL
  Filled 2018-08-15 (×17): qty 30

## 2018-08-15 MED ORDER — MAGNESIUM SULFATE 2 GM/50ML IV SOLN
2.0000 g | Freq: Once | INTRAVENOUS | Status: AC
  Administered 2018-08-15: 2 g via INTRAVENOUS
  Filled 2018-08-15: qty 50

## 2018-08-15 MED ORDER — ALBUTEROL SULFATE (2.5 MG/3ML) 0.083% IN NEBU
2.5000 mg | INHALATION_SOLUTION | RESPIRATORY_TRACT | Status: DC | PRN
  Administered 2018-08-21: 2.5 mg via RESPIRATORY_TRACT
  Filled 2018-08-15: qty 3

## 2018-08-15 MED ORDER — PANCRELIPASE (LIP-PROT-AMYL) 12000-38000 UNITS PO CPEP
12000.0000 [IU] | ORAL_CAPSULE | Freq: Three times a day (TID) | ORAL | Status: DC
  Administered 2018-08-15 – 2018-08-16 (×2): 12000 [IU] via ORAL
  Filled 2018-08-15 (×4): qty 1

## 2018-08-15 MED ORDER — HEPARIN SODIUM (PORCINE) 1000 UNIT/ML DIALYSIS: 1000.0000 [IU] | INTRAMUSCULAR | Status: DC | PRN

## 2018-08-15 MED ORDER — BOOST / RESOURCE BREEZE PO LIQD CUSTOM
1.0000 | Freq: Two times a day (BID) | ORAL | Status: DC
  Administered 2018-08-15 – 2018-08-22 (×10): 1 via ORAL
  Filled 2018-08-15 (×15): qty 1

## 2018-08-15 MED ORDER — DARBEPOETIN ALFA 200 MCG/0.4ML IJ SOSY
200.0000 ug | PREFILLED_SYRINGE | INTRAMUSCULAR | Status: DC
  Administered 2018-08-15 – 2018-08-22 (×2): 200 ug via SUBCUTANEOUS
  Filled 2018-08-15 (×2): qty 0.4

## 2018-08-15 NOTE — H&P (Signed)
History and Physical    [name] QPY:[id number] DOB: 01-16-1951 DOA: 08/14/2018  PCP: Bernerd Limbo, MD  Patient coming from: Home.  Chief Complaint: Low potassium.  HPI: [name] is a 67 y.o. female with history of CAD, abdominal aortic aneurysm status post repair, recently admitted for pancreatitis following which patient was again admitted with renal failure requiring dialysis and eventually was discharged home after doing well and was off dialysis and followed up with her primary care physician yesterday and was found to have increasing peripheral edema for which patient was given Lasix 120 mg.  Labs revealed low potassium and was advised to come to the ER.  Patient otherwise denies any chest pain shortness of breath nausea vomiting or diarrhea.  ED Course: In the ER labs again revealed hypokalemia with potassium of 2.2 creatinine of 3.5 which is increased from 2.9 recently with BUN of 121.  Chest x-ray shows fluid overload and also on exam patient has lower extremity edema.  Patient got potassium replacement and admitted for fluid overload with possibility of requiring dialysis again.  Review of Systems: As per HPI, rest all negative.   Past Medical History:  Diagnosis Date  . AAA (abdominal aortic aneurysm) ([place]) 5/09   3.7x3.3 by u/s 2009  . Abnormal EKG    deep TW inversions chronic  . Anemia   . CAD (coronary artery disease)    a. CABG 03/2011 LIMA to LAD, SVG to OM, SVG to PDA. b. cath 06/25/2015 DES to SVG to rPDA, patent LIMA to LAD, patent SVG to OM  . carotid stenosis 5/09   S/p L CEA ;  60-79% bilat ICA by preCABG dopplers 7/12  . CHF (congestive heart failure) ([place])   . Chronic back pain    "all over" (06/24/2015)  . Chronic bronchitis ([place])    "get it pretty much q yr" (06/24/2015)  . CKD (chronic kidney disease)   . Complication of anesthesia 1966   "problem w/ether"  . COPD (chronic obstructive pulmonary disease) ([place])   . Depression   . GERD  (gastroesophageal reflux disease)    With hiatal hernia  . GIB (gastrointestinal bleeding) 9/11   S/p EGD with cautery at [facility]  . Heart murmur   . History of blood transfusion "a few times"   "all related to anemia" (06/24/2015)  . History of hiatal hernia   . Hyperlipidemia   . Hypertension   . Mitral regurgitation    3+ MR by intraoperative TEE;  s/p MV repair with Dr. Roxy Manns 7/12  . Myocardial infarction [facility]) 2012 "several"  . On home oxygen therapy    "2 liters at night; negative for sleep apnea"  . PAD (peripheral artery disease) (HCC)    Severe; s/p bilateral renal artery stents, moderate in-stent restenosis  . Pancreatitis 05/2018  . Paroxysmal atrial fibrillation (HCC)    coumadin;  echo 9/07: EF 60%, mild LVH;  s/p Cox Maze 7/12 with LAA clipping  . Subclavian artery stenosis, left (HCC)    stented by Dr. Trula Slade on 10/18 to help flow of her LIMA to LAD  . Type II diabetes mellitus ([place])     Past Surgical History:  Procedure Laterality Date  . A/V FISTULAGRAM Left 03/21/2018   Procedure: A/V FISTULAGRAM;  Surgeon: Serafina Mitchell, MD;  Location: [place] CV LAB;  Service: Cardiovascular;  Laterality: Left;  . ABDOMINAL AORTIC ENDOVASCULAR STENT GRAFT N/A 01/17/2018   Procedure: ABDOMINAL AORTIC ENDOVASCULAR STENT GRAFT WITH CO2;  Surgeon: Serafina Mitchell, MD;  Location: [facility];  Service: Vascular;  Laterality: N/A;  . ABDOMINAL HYSTERECTOMY  1990's  . APPENDECTOMY  Aug. 11, 2016   Ruptured  . AV FISTULA PLACEMENT Left 11/03/2017   Procedure: ARTERIOVENOUS (AV) FISTULA CREATION LEFT ARM;  Surgeon: Serafina Mitchell, MD;  Location: [place];  Service: Vascular;  Laterality: Left;  . BOWEL RESECTION  2013  . CARDIAC CATHETERIZATION N/A 06/24/2015   Procedure: Left Heart Cath and Cors/Grafts Angiography;  Surgeon: Leonie Man, MD;  Location: [place] CV LAB;  Service: Cardiovascular;  Laterality: N/A;  . CARDIAC CATHETERIZATION N/A 06/25/2015   Procedure: Coronary Stent  Intervention;  Surgeon: Sherren Mocha, MD;  Location: [place] [facility];  Service: Cardiovascular;  Laterality: N/A;  . CAROTID ENDARTERECTOMY Left   . CATARACT EXTRACTION Right 03/27/2018  . CHOLECYSTECTOMY N/A 04/19/2018   Procedure: LAPAROSCOPIC CHOLECYSTECTOMY WITH INTRAOPERATIVE CHOLANGIOGRAM;  Surgeon: Rolm Bookbinder, MD;  Location: [place];  Service: General;  Laterality: N/A;  . CORONARY ANGIOPLASTY    . CORONARY ARTERY BYPASS GRAFT  03/05/2011   CABG X3 (LIMA to LAD, SVG to OM, SVG to PDA, EVH via left thigh  . CORONARY STENT PLACEMENT    . ESOPHAGOGASTRODUODENOSCOPY N/A 04/12/2018   Procedure: ESOPHAGOGASTRODUODENOSCOPY (EGD);  Surgeon: [name], MD;  Location[facility] ENDOSCOPY;  Service: Endoscopy;  Laterality: N/A;  . ESOPHAGOGASTRODUODENOSCOPY N/A 06/16/2018   Procedure: ESOPHAGOGASTRODUODENOSCOPY (EGD);  Surgeon: Thornton Park, MD;  Location: [place];  Service: Gastroenterology;  Laterality: N/A;  . LACERATION REPAIR Right 1990's   WRIST  . MAZE  03/05/2011   complete biatrial lesion set with clipping of LA appendage  . MITRAL VALVE REPAIR  03/05/2011   25m Memo 3D ring annuloplasty for ischemic MR  . PERIPHERAL VASCULAR BALLOON ANGIOPLASTY Left 03/21/2018   Procedure: PERIPHERAL VASCULAR BALLOON ANGIOPLASTY;  Surgeon: BSerafina Mitchell MD;  [facility];  Service: Cardiovascular;  Laterality: Left;  . PERIPHERAL VASCULAR CATHETERIZATION N/A 06/24/2015   Procedure: Aortic Arch Angiography;  Surgeon: VSerafina Mitchell MD;  [facility];  Service: Cardiovascular;  Laterality: N/A;  . PERIPHERAL VASCULAR CATHETERIZATION  06/24/2015   Procedure: Peripheral Vascular Intervention;  Surgeon: VSerafina Mitchell MD;  [facility];  Service: Cardiovascular;;  . PERIPHERAL VASCULAR CATHETERIZATION N/A 06/24/2015   Procedure: Abdominal Aortogram;  Surgeon: VSerafina Mitchell MD;  [facility];  Service: Cardiovascular;   Laterality: N/A;  . RENAL ANGIOGRAM Bilateral 09/11/2013   Procedure: RENAL ANGIOGRAM;  Surgeon: VSerafina Mitchell MD;  [facility];  Service: Cardiovascular;  Laterality: Bilateral;  . RIGHT FEMORAL-POPLITEAL BYPASS       reports that she has been smoking cigarettes. She has smoked for the past 50.00 years. She has never used smokeless tobacco. She reports that she drinks alcohol. She reports that she has current or past drug history. Drug: Marijuana.  Allergies  Allergen Reactions  . Isosorbide Other (See Comments)    Can only tolerate in low doses  . Isosorbide Nitrate Other (See Comments)    Can only tolerate in low doses  . Oxycodone-Acetaminophen Itching    Tolerates Hydrocodone.  Also tolerated oxycodone 07/13/18 admission.    . Codeine Itching  . Contrast Media [Iodinated Diagnostic Agents] Itching and Other (See Comments)    Itching of feet  . Ioxaglate Itching and Other (See Comments)    Itching of feet  . Metrizamide Itching and Other (See Comments)  Itching of feet  . Percocet [Oxycodone-Acetaminophen] Itching and Other (See Comments)    Tolerates Hydrocodone    Family History  Problem Relation Age of Onset  . Emphysema Mother   . Heart disease Mother        before age 60  . Hypertension Mother   . Hyperlipidemia Mother   . Heart attack Mother   . AAA (abdominal aortic aneurysm) Mother        rupture  . Heart attack Father 27  . Emphysema Father   . Heart disease Father        before age 43  . Hyperlipidemia Father   . Hypertension Father   . Peripheral vascular disease Father   . Diabetes Brother   . Heart disease Brother        before age 78  . Hyperlipidemia Brother   . Hypertension Brother   . Heart attack Brother        CABG  . Heart disease Other        Vascular disease in grandparents, uncles and dad  . Colon cancer Neg Hx     Prior to Admission medications   Medication Sig Start Date End Date Taking? Authorizing Provider    acetaminophen (TYLENOL) 325 MG tablet Take 325 mg by mouth every 6 (six) hours as needed (for pain).   Yes [provider]  albuterol (PROVENTIL HFA;VENTOLIN HFA) 108 (90 Base) MCG/ACT inhaler Inhale 2 puffs into the lungs every 4 (four) hours as needed for wheezing or shortness of breath. ProAir 10/08/15  Yes Delfina Redwood, MD  allopurinol (ZYLOPRIM) 100 MG tablet Take 100 mg by mouth daily. 02/02/17  Yes [provider]  amLODipine (NORVASC) 10 MG tablet Take 10 mg by mouth daily.     Yes [provider]  atorvastatin (LIPITOR) 40 MG tablet Take 40 mg by mouth daily.  12/07/17  Yes [provider]  budesonide-formoterol (SYMBICORT) 160-4.5 MCG/ACT inhaler Inhale 2 puffs into the lungs 2 (two) times daily as needed (for flares).   Yes [provider]  carvedilol (COREG) 12.5 MG tablet Take 1 tablet (12.5 mg total) by mouth 2 (two) times daily with a meal. 06/26/18  Yes Thurnell Lose, MD  Cholecalciferol (VITAMIN D3) 5000 units TABS Take 5,000 Units by mouth daily.   Yes [provider]  clopidogrel (PLAVIX) 75 MG tablet Take 1 tablet (75 mg total) by mouth daily with breakfast. 06/26/15  Yes Almyra Deforest, PA  Cyanocobalamin (VITAMIN B-12) 5000 MCG SUBL Place 5,000 mcg under the tongue daily.   Yes [provider]  fenofibrate 160 MG tablet Take 1 tablet (160 mg total) by mouth daily. 10/08/15  Yes Delfina Redwood, MD  ferrous sulfate 325 (65 FE) MG tablet Take 1 tablet (325 mg total) by mouth daily with breakfast. 07/19/18  Yes Florencia Reasons, MD  furosemide (LASIX) 40 MG tablet Take 1 tablet (40 mg total) by mouth daily as needed (weight gain). 08/03/18  Yes Patrecia Pour, MD  gabapentin (NEURONTIN) 300 MG capsule Take 1 capsule (300 mg total) by mouth daily. TAKE 1 CAPSULE(300 MG) BY MOUTH TWICE DAILY Patient taking differently: Take 300 mg by mouth 2 (two) times daily.  08/03/18  Yes Patrecia Pour, MD  hydrALAZINE (APRESOLINE) 25 MG  tablet Take 1 tablet (25 mg total) by mouth every 8 (eight) hours. 06/26/18  Yes Thurnell Lose, MD  HYDROmorphone (DILAUDID) 2 MG tablet Take 2 mg by mouth every 4 (four)  hours as needed for severe pain.   Yes [provider]  lipase/protease/amylase (CREON) 12000 units CPEP capsule Take 1 capsule (12,000 Units total) by mouth 3 (three) times daily with meals. 08/03/18  Yes Patrecia Pour, MD  loperamide (IMODIUM A-D) 2 MG tablet Take 2 mg by mouth 4 (four) times daily as needed for diarrhea or loose stools.   Yes [provider]  NARCAN 4 MG/0.1ML LIQD nasal spray kit Place 1 spray into the nose once.  07/21/18  Yes [provider]  pantoprazole (PROTONIX) 40 MG tablet Take 1 tablet (40 mg total) by mouth daily. 08/03/18  Yes Patrecia Pour, MD  polyethylene glycol (MIRALAX / GLYCOLAX) packet Take 17 g by mouth daily as needed for mild constipation. 06/26/18  Yes Thurnell Lose, MD  Amino Acids-Protein Hydrolys (FEEDING SUPPLEMENT, PRO-STAT SUGAR FREE 64,) LIQD Take 30 mLs by mouth 3 (three) times daily with meals. 06/26/18   Thurnell Lose, MD  feeding supplement (BOOST / RESOURCE BREEZE) LIQD Take 1 Container by mouth 2 (two) times daily.    [provider]  vitamin B-12 100 MCG tablet Take 1 tablet (100 mcg total) by mouth daily. Patient not taking: Reported on 08/14/2018 06/26/18   Thurnell Lose, MD    Physical Exam: Vitals:   08/15/18 0230 08/15/18 0300 08/15/18 0330 08/15/18 0400  BP: (!) 142/49 (!) 135/44 (!) 139/59 (!) 142/40  Pulse: 61 63 (!) 59 61  Resp: '12 14 12 12  '$ Temp:      TempSrc:      SpO2: 96% 100% 99% 99%      Constitutional: Moderately built and nourished. Vitals:   08/15/18 0230 08/15/18 0300 08/15/18 0330 08/15/18 0400  BP: (!) 142/49 (!) 135/44 (!) 139/59 (!) 142/40  Pulse: 61 63 (!) 59 61  Resp: '12 14 12 12  '$ Temp:      TempSrc:      SpO2: 96% 100% 99% 99%   Eyes: Anicteric no pallor. ENMT: No discharge from  the ears eyes nose or mouth. Neck: No mass felt.  No neck rigidity.  No JVD appreciated. Respiratory: No rhonchi or crepitations. Cardiovascular: S1-S2 heard no murmurs appreciated. Abdomen: Soft nontender bowel sounds present. Musculoskeletal: Bilateral lower extremity edema present. Skin: No rash. Neurologic: Alert awake oriented to time place and person.  Moves all extremities. Psychiatric: Appears normal per normal affect.   Labs on Admission: I have personally reviewed following labs and imaging studies  CBC: Recent Labs  Lab 08/14/18 2239  WBC 9.3  HGB 8.7*  HCT 27.4*  MCV 89.3  PLT 272   Basic Metabolic Panel: Recent Labs  Lab 08/14/18 1709 08/14/18 2239  NA 137 136  K 2.7* 2.2*  CL 99 100  CO2 22 17*  GLUCOSE 202* 115*  BUN 122* 121*  CREATININE 3.43* 3.57*  CALCIUM 7.3* 7.2*   GFR: Estimated Creatinine Clearance: 14.7 mL/min (A) (by C-G formula based on SCr of 3.57 mg/dL (H)). Liver Function Tests: Recent Labs  Lab 08/14/18 1709  AST 12  ALT 4  ALKPHOS 39  BILITOT 0.9  PROT 6.4  ALBUMIN 3.3*   No results for input(s): LIPASE, AMYLASE in the last 168 hours. No results for input(s): AMMONIA in the last 168 hours. Coagulation Profile: No results for input(s): INR, PROTIME in the last 168 hours. Cardiac Enzymes: No results for input(s): CKTOTAL, CKMB, CKMBINDEX, TROPONINI in the last 168 hours. BNP (last 3 results) No results for input(s): PROBNP in  the last 8760 hours. HbA1C: No results for input(s): HGBA1C in the last 72 hours. CBG: No results for input(s): GLUCAP in the last 168 hours. Lipid Profile: No results for input(s): CHOL, HDL, LDLCALC, TRIG, CHOLHDL, LDLDIRECT in the last 72 hours. Thyroid Function Tests: No results for input(s): TSH, T4TOTAL, FREET4, T3FREE, THYROIDAB in the last 72 hours. Anemia Panel: No results for input(s): VITAMINB12, FOLATE, FERRITIN, TIBC, IRON, RETICCTPCT in the last 72 hours. Urine analysis:    Component  Value Date/Time   COLORURINE YELLOW 07/25/2018 0530   APPEARANCEUR CLEAR 07/25/2018 0530   LABSPEC 1.008 07/25/2018 0530   PHURINE 5.0 07/25/2018 0530   GLUCOSEU NEGATIVE 07/25/2018 0530   HGBUR NEGATIVE 07/25/2018 0530   BILIRUBINUR NEGATIVE 07/25/2018 0530   KETONESUR NEGATIVE 07/25/2018 0530   PROTEINUR NEGATIVE 07/25/2018 0530   UROBILINOGEN 0.2 03/04/2011 2107   NITRITE NEGATIVE 07/25/2018 0530   LEUKOCYTESUR TRACE (A) 07/25/2018 0530   Sepsis Labs: '@LABRCNTIP'$ (procalcitonin:4,lacticidven:4) )No results found for this or any previous visit (from the past 240 hour(s)).   Radiological Exams on Admission: Dg Chest 2 View  Result Date: 08/14/2018 CLINICAL DATA:  Initial evaluation for market leg swelling. EXAM: CHEST - 2 VIEW COMPARISON:  Prior radiograph from 07/24/2018. FINDINGS: Median sternotomy wires underlying CABG markers, valvular prosthesis, and left atrial appendage clip noted. Vascular stent at the upper left mediastinum. Severe cardiomegaly, stable. Mediastinal silhouette within normal limits. Aortic atherosclerosis. Lungs mildly hypoinflated. Diffuse vascular congestion with interstitial prominence compatible with diffuse pulmonary interstitial edema. Superimposed mild bibasilar subsegmental atelectasis. No consolidative opacity. No pleural effusion. No pneumothorax. No acute osseous abnormality. IMPRESSION: 1. Cardiomegaly with mild diffuse pulmonary interstitial edema. 2. Sequelae of prior CABG. 3. Aortic atherosclerosis. Electronically Signed   By: Jeannine Boga M.D.   On: 08/14/2018 22:26      Assessment/Plan Principal Problem:   Renal failure (ARF), acute on chronic (HCC) Active Problems:   Essential hypertension   Coronary artery disease involving native heart with angina pectoris (HCC)   S/P Maze operation for atrial fibrillation   S/P MVR (mitral valve repair)   AAA (abdominal aortic aneurysm) without rupture (HCC)   COPD with hypoxia (HCC)    Hypokalemia   Acute pulmonary edema ([place])   ARF (acute renal failure) ([place])    1. Acute on chronic kidney disease with pulmonary edema and fluid overload -discussed with on-call nephrologist Dr. Pearson Grippe who advised at this time to hold off further Lasix given the increased BUN.  Will be seeing patient in consult and may require dialysis as per the nephrologist.  Follow intake output metabolic panel. 2. Hypokalemia -nephrologist advised to keep patient on addition 40 mEq of p.o. potassium which I have ordered follow metabolic panel. 3. CAD denies any chest pain.  Continue Coreg statins and Plavix. 4. Hypertension on hydralazine amlodipine and Coreg. 5. History of gout on allopurinol. 6. Recently diagnosed with pancreatitis on Creon.  Chronic 7. Chronic pain on narcotics. 8. Anemia likely from ESRD on iron supplements.  EKG UA is pending   DVT prophylaxis: Heparin. Code Status: Full code. Family Communication: Discussed with patient. Disposition Plan: Home. Consults called: Nephrology. Admission status: Observation.   Rise Patience MD [facility] Pager [phone].  If 7PM-7AM, please contact night-coverage [email or web address] Password [email or web address]  08/15/2018, 5:42 AM

## 2018-08-15 NOTE — ED Notes (Signed)
Date and time results received: 08/15/18 10:17 AM   Test: K Critical Value: 2.1  Name of Provider Notified: Sarajane Jews MD (text paged on [place])

## 2018-08-15 NOTE — Progress Notes (Addendum)
PROGRESS NOTE  [name]:[id number] DOB: 18-Apr-1951 DOA: 08/14/2018 PCP: Bernerd Limbo, MD  Brief Narrative: 67 year old woman with recent admission for pancreatitis, CKD with recent dialysis but not yet end-stage, was seen by gastroenterology to follow-up pancreatitis.  Was noted to have anasarca and sent to the emergency department for further evaluation and treatment of this and hypokalemia.  Admitted for acute on chronic kidney disease with pulmonary edema and volume overload as well as hypokalemia.  Assessment/Plan Acute on chronic kidney disease stage V with pulmonary edema and volume overload --Plan for hemodialysis as per nephrology  Severe hypokalemia, hypomagnesemia --Replete both, recheck magnesium and potassium in a.m.  Recent acute pancreatitis --Seems to feel well, continue supportive care, follow clinically --Continue pancreatic enzymes  Anemia of chronic kidney disease, appears to be close to baseline. --Check CBC in a.m.  Essential hypertension --Stable, continue hydralazine, amlodipine, carvedilol  CAD --Appears stable.  Continue Coreg, statin, Plavix  COPD, chronic hypoxic respiratory failure on 2 L at night --Appears stable.  Continue supplemental oxygen  Diabetes mellitus type 2 --CBG stable.  Aortic atherosclerosis   DVT prophylaxis: heparin Code Status: Full Family Communication: none Disposition Plan: home    Murray Hodgkins, MD  [facility] Direct contact: [phone] --Via amion app OR  --[email or web address]; password [email or web address]  7PM-7AM contact night coverage as above 08/15/2018, 2:48 PM  LOS: 0 days   Consultants:  Nephrology  Procedures:    Antimicrobials:    Interval history/Subjective: Feels okay.  No pain now.  No complaints.  Objective: Vitals:  Vitals:   08/15/18 1300 08/15/18 1433  BP: (!) 143/42 (!) 143/61  Pulse:  65  Resp: 10 18  Temp:  98.9 F (37.2 C)  SpO2:  91%    Exam:  Constitutional:   . Appears calm and comfortable, appears chronically ill Eyes:  . pupils and irises appear normal ENMT:  . grossly normal hearing  Respiratory:  . CTA bilaterally, no w/r/r.  . Respiratory effort normal.  Cardiovascular:  . RRR, no m/r/g . 2-3+ bilateral LE extremity edema   Abdomen:  . Soft Psychiatric:  . Mental status o Mood, affect appropriate  I have personally reviewed the following:   Data: . Potassium 2.1, CO2 19, BUN 122, creatinine increasing 3.68, anion gap 18, magnesium 1.8, lipase 106, remainder LFTs unremarkable.  Hemoglobin drifting down, 7.8. [place] Kitchen Chest x-ray showed pulmonary edema.  EKG sinus rhythm, right bundle branch block, no acute changes noted compared to previous study 07/24/2018.  Scheduled Meds: . allopurinol  100 mg Oral Daily  . amLODipine  10 mg Oral Daily  . atorvastatin  40 mg Oral Daily  . carvedilol  12.5 mg Oral BID WC  . [START ON 08/16/2018] Chlorhexidine Gluconate Cloth  6 each Topical Q0600  . clopidogrel  75 mg Oral Q breakfast  . darbepoetin (ARANESP) injection - NON-DIALYSIS  200 mcg Subcutaneous Q Tue-1800  . feeding supplement  1 Container Oral BID BM  . feeding supplement (PRO-STAT SUGAR FREE 64)  30 mL Oral TID WC  . fenofibrate  160 mg Oral Daily  . ferrous sulfate  325 mg Oral Q breakfast  . gabapentin  300 mg Oral Daily  . heparin  5,000 Units Subcutaneous Q8H  . hydrALAZINE  25 mg Oral Q8H  . lipase/protease/amylase  12,000 Units Oral TID WC  . mometasone-formoterol  2 puff Inhalation BID  . pantoprazole  40 mg Oral Daily  . vitamin B-12  1,000 mcg Oral Daily   Continuous Infusions:  Principal Problem:   Renal failure (ARF), acute on chronic (HCC) Active Problems:   Essential hypertension   Coronary artery disease involving native heart with angina pectoris (HCC)   COPD with hypoxia (HCC)   Hypokalemia   Acute pulmonary edema (HCC)   ARF (acute renal failure) (HCC)   CKD (chronic kidney disease), stage V ([place])    Hypomagnesemia   LOS: 0 days    Prolonged services [phone], review of chart, discussion and interview with patient

## 2018-08-15 NOTE — ED Notes (Signed)
Patient O2 decreases while sleeping to 86-89% with a good pleth; place on [employer] O2 @ 2lpm.

## 2018-08-15 NOTE — Consult Note (Signed)
Referring Provider: No ref. provider found Primary Care Physician:  Bernerd Limbo, MD Primary Nephrologist:  Dr. Posey Pronto  Reason for Consultation: Acute on chronic kidney injury, hypertension and maintenance of euvolemia, anemia and secondary hyperparathyroidism  HPI: This is a delightful lady who is 67 years of age she has a history of coronary artery disease status post CABG 2012.  She had a DES stent placed October 2016.  He has a history of peripheral vascular disease status post left carotid endarterectomy in 2012.  History of abdominal aortic aneurysm status post repair 2009 and bilateral renal artery stents.  History of congestive heart failure with an ejection fraction of 60% predominantly diastolic dysfunction.  She was discharged on July 03, 2018 with acute kidney injury and underwent intermittent dialysis 07/28/2018 and 07/27/2018.  Creatinine was about 4 while she was receiving dialysis on discharge it was 2.94.  It was determined that she would not require chronic outpatient dialysis treatments at this point.  She was admitted 08/15/2018, she has had increasing lower extremity edema and is been evaluated by her primary care physician.  Subsequent labs that shown a creatinine of 3.5 and a BUN of 121 with hypokalemia.  She was advised to present to the emergency room in anticipation that dialysis would be needed to be instituted..  She complains of fatigue and weakness she is generally incapacitated and does not ambulate.  In the emergency room her blood pressure is 140/80 pulse of 60 temperature 98.4 O2 sats 93% 2 L nasal cannula.  Sodium 138 potassium 2.1 chloride 101 CO2 19 glucose 119 BUN 122.  WBC 9.3 hemoglobin 7.8 and platelets 258  Past Medical History:  Diagnosis Date  . AAA (abdominal aortic aneurysm) ([place]) 5/09   3.7x3.3 by u/s 2009  . Abnormal EKG    deep TW inversions chronic  . Anemia   . CAD (coronary artery disease)    a. CABG 03/2011 LIMA to LAD, SVG to OM, SVG to PDA.  b. cath 06/25/2015 DES to SVG to rPDA, patent LIMA to LAD, patent SVG to OM  . carotid stenosis 5/09   S/p L CEA ;  60-79% bilat ICA by preCABG dopplers 7/12  . CHF (congestive heart failure) ([place])   . Chronic back pain    "all over" (06/24/2015)  . Chronic bronchitis ([place])    "get it pretty much q yr" (06/24/2015)  . CKD (chronic kidney disease)   . Complication of anesthesia 1966   "problem w/ether"  . COPD (chronic obstructive pulmonary disease) ([place])   . Depression   . GERD (gastroesophageal reflux disease)    With hiatal hernia  . GIB (gastrointestinal bleeding) 9/11   S/p EGD with cautery at [facility]  . Heart murmur   . History of blood transfusion "a few times"   "all related to anemia" (06/24/2015)  . History of hiatal hernia   . Hyperlipidemia   . Hypertension   . Mitral regurgitation    3+ MR by intraoperative TEE;  s/p MV repair with Dr. Roxy Manns 7/12  . Myocardial infarction [facility]) 2012 "several"  . On home oxygen therapy    "2 liters at night; negative for sleep apnea"  . PAD (peripheral artery disease) ([facility])    Severe; s/p bilateral renal artery stents, moderate in-stent restenosis  . Pancreatitis 05/2018  . Paroxysmal atrial fibrillation ([facility])    coumadin;  echo 9/07: EF 60%, mild LVH;  s/p Cox Maze 7/12 with LAA clipping  . Subclavian artery stenosis, left ([facility])  stented by Dr. Trula Slade on 10/18 to help flow of her LIMA to LAD  . Type II diabetes mellitus ([place])     Past Surgical History:  Procedure Laterality Date  . A/V FISTULAGRAM Left 03/21/2018   Procedure: A/V FISTULAGRAM;  Surgeon: Serafina Mitchell, MD;  Location: [place] [facility];  Service: Cardiovascular;  Laterality: Left;  . ABDOMINAL AORTIC ENDOVASCULAR STENT GRAFT N/A 01/17/2018   Procedure: ABDOMINAL AORTIC ENDOVASCULAR STENT GRAFT WITH CO2;  Surgeon: Serafina Mitchell, MD;  Location: [place];  Service: Vascular;  Laterality: N/A;  . ABDOMINAL HYSTERECTOMY  1990's  . APPENDECTOMY  Aug. 11, 2016   Ruptured   . AV FISTULA PLACEMENT Left 11/03/2017   Procedure: ARTERIOVENOUS (AV) FISTULA CREATION LEFT ARM;  Surgeon: Serafina Mitchell, MD;  Location: [place];  Service: Vascular;  Laterality: Left;  . BOWEL RESECTION  2013  . CARDIAC CATHETERIZATION N/A 06/24/2015   Procedure: Left Heart Cath and Cors/Grafts Angiography;  Surgeon: Leonie Man, MD;  Location: [place] [facility];  Service: Cardiovascular;  Laterality: N/A;  . CARDIAC CATHETERIZATION N/A 06/25/2015   Procedure: Coronary Stent Intervention;  Surgeon: Sherren Mocha, MD;  Location: [place] [facility];  Service: Cardiovascular;  Laterality: N/A;  . CAROTID ENDARTERECTOMY Left   . CATARACT EXTRACTION Right 03/27/2018  . CHOLECYSTECTOMY N/A 04/19/2018   Procedure: LAPAROSCOPIC CHOLECYSTECTOMY WITH INTRAOPERATIVE CHOLANGIOGRAM;  Surgeon: Rolm Bookbinder, MD;  Location: [place];  Service: General;  Laterality: N/A;  . CORONARY ANGIOPLASTY    . CORONARY ARTERY BYPASS GRAFT  03/05/2011   CABG X3 (LIMA to LAD, SVG to OM, SVG to PDA, EVH via left thigh  . CORONARY STENT PLACEMENT    . ESOPHAGOGASTRODUODENOSCOPY N/A 04/12/2018   Procedure: ESOPHAGOGASTRODUODENOSCOPY (EGD);  Surgeon: [name], MD;  Location: [facility];  Service: Endoscopy;  Laterality: N/A;  . ESOPHAGOGASTRODUODENOSCOPY N/A 06/16/2018   Procedure: ESOPHAGOGASTRODUODENOSCOPY (EGD);  Surgeon: Thornton Park, MD;  Location: [place];  Service: Gastroenterology;  Laterality: N/A;  . LACERATION REPAIR Right 1990's   WRIST  . MAZE  03/05/2011   complete biatrial lesion set with clipping of LA appendage  . MITRAL VALVE REPAIR  03/05/2011   71m Memo 3D ring annuloplasty for ischemic MR  . PERIPHERAL VASCULAR BALLOON ANGIOPLASTY Left 03/21/2018   Procedure: PERIPHERAL VASCULAR BALLOON ANGIOPLASTY;  Surgeon: BSerafina Mitchell MD;  [place];  Service: Cardiovascular;  Laterality: Left;  . PERIPHERAL VASCULAR CATHETERIZATION N/A 06/24/2015   Procedure:  Aortic Arch Angiography;  Surgeon: VSerafina Mitchell MD;  [place];  Service: Cardiovascular;  Laterality: N/A;  . PERIPHERAL VASCULAR CATHETERIZATION  06/24/2015   Procedure: Peripheral Vascular Intervention;  Surgeon: VSerafina Mitchell MD;  [place] [facility];  Service: Cardiovascular;;  . PERIPHERAL VASCULAR CATHETERIZATION N/A 06/24/2015   Procedure: Abdominal Aortogram;  Surgeon: VSerafina Mitchell MD;  [place];  Service: Cardiovascular;  Laterality: N/A;  . RENAL ANGIOGRAM Bilateral 09/11/2013   Procedure: RENAL ANGIOGRAM;  Surgeon: VSerafina Mitchell MD;  [facility];  Service: Cardiovascular;  Laterality: Bilateral;  . RIGHT FEMORAL-POPLITEAL BYPASS      Prior to Admission medications   Medication Sig Start Date End Date Taking? Authorizing Provider  acetaminophen (TYLENOL) 325 MG tablet Take 325 mg by mouth every 6 (six) hours as needed (for pain).   Yes [provider]  albuterol (PROVENTIL HFA;VENTOLIN HFA) 108 (90 Base) MCG/ACT inhaler Inhale 2 puffs into the lungs  every 4 (four) hours as needed for wheezing or shortness of breath. ProAir 10/08/15  Yes Delfina Redwood, MD  allopurinol (ZYLOPRIM) 100 MG tablet Take 100 mg by mouth daily. 02/02/17  Yes [provider]  amLODipine (NORVASC) 10 MG tablet Take 10 mg by mouth daily.     Yes [provider]  atorvastatin (LIPITOR) 40 MG tablet Take 40 mg by mouth daily.  12/07/17  Yes [provider]  budesonide-formoterol (SYMBICORT) 160-4.5 MCG/ACT inhaler Inhale 2 puffs into the lungs 2 (two) times daily as needed (for flares).   Yes [provider]  carvedilol (COREG) 12.5 MG tablet Take 1 tablet (12.5 mg total) by mouth 2 (two) times daily with a meal. 06/26/18  Yes Thurnell Lose, MD  Cholecalciferol (VITAMIN D3) 5000 units TABS Take 5,000 Units by mouth daily.   Yes [provider]  clopidogrel (PLAVIX) 75 MG tablet Take 1  tablet (75 mg total) by mouth daily with breakfast. 06/26/15  Yes Almyra Deforest, PA  Cyanocobalamin (VITAMIN B-12) 5000 MCG SUBL Place 5,000 mcg under the tongue daily.   Yes [provider]  fenofibrate 160 MG tablet Take 1 tablet (160 mg total) by mouth daily. 10/08/15  Yes Delfina Redwood, MD  ferrous sulfate 325 (65 FE) MG tablet Take 1 tablet (325 mg total) by mouth daily with breakfast. 07/19/18  Yes Florencia Reasons, MD  furosemide (LASIX) 40 MG tablet Take 1 tablet (40 mg total) by mouth daily as needed (weight gain). 08/03/18  Yes Patrecia Pour, MD  gabapentin (NEURONTIN) 300 MG capsule Take 1 capsule (300 mg total) by mouth daily. TAKE 1 CAPSULE(300 MG) BY MOUTH TWICE DAILY Patient taking differently: Take 300 mg by mouth 2 (two) times daily.  08/03/18  Yes Patrecia Pour, MD  hydrALAZINE (APRESOLINE) 25 MG tablet Take 1 tablet (25 mg total) by mouth every 8 (eight) hours. 06/26/18  Yes Thurnell Lose, MD  HYDROmorphone (DILAUDID) 2 MG tablet Take 2 mg by mouth every 4 (four) hours as needed for severe pain.   Yes [provider]  lipase/protease/amylase (CREON) 12000 units CPEP capsule Take 1 capsule (12,000 Units total) by mouth 3 (three) times daily with meals. 08/03/18  Yes Patrecia Pour, MD  loperamide (IMODIUM A-D) 2 MG tablet Take 2 mg by mouth 4 (four) times daily as needed for diarrhea or loose stools.   Yes [provider]  NARCAN 4 MG/0.1ML LIQD nasal spray kit Place 1 spray into the nose once.  07/21/18  Yes [provider]  pantoprazole (PROTONIX) 40 MG tablet Take 1 tablet (40 mg total) by mouth daily. 08/03/18  Yes Patrecia Pour, MD  polyethylene glycol (MIRALAX / GLYCOLAX) packet Take 17 g by mouth daily as needed for mild constipation. 06/26/18  Yes Thurnell Lose, MD  Amino Acids-Protein Hydrolys (FEEDING SUPPLEMENT, PRO-STAT SUGAR FREE 64,) LIQD Take 30 mLs by mouth 3 (three) times daily with meals. 06/26/18   Thurnell Lose, MD  feeding  supplement (BOOST / RESOURCE BREEZE) LIQD Take 1 Container by mouth 2 (two) times daily.    [provider]  vitamin B-12 100 MCG tablet Take 1 tablet (100 mcg total) by mouth daily. Patient not taking: Reported on 08/14/2018 06/26/18   Thurnell Lose, MD    Current Facility-Administered Medications  Medication Dose Route Frequency Provider Last Rate Last Dose  . albuterol (PROVENTIL) (2.5 MG/3ML) 0.083% nebulizer solution 2.5 mg  2.5 mg Inhalation Q4H PRN  Rise Patience, MD      . allopurinol (ZYLOPRIM) tablet 100 mg  100 mg Oral Daily Rise Patience, MD   100 mg at 08/15/18 1111  . amLODipine (NORVASC) tablet 10 mg  10 mg Oral Daily Rise Patience, MD   10 mg at 08/15/18 1106  . atorvastatin (LIPITOR) tablet 40 mg  40 mg Oral Daily Rise Patience, MD   40 mg at 08/15/18 1105  . carvedilol (COREG) tablet 12.5 mg  12.5 mg Oral BID WC Rise Patience, MD   12.5 mg at 08/15/18 1105  . clopidogrel (PLAVIX) tablet 75 mg  75 mg Oral Q breakfast Rise Patience, MD   75 mg at 08/15/18 1105  . feeding supplement (BOOST / RESOURCE BREEZE) liquid 1 Container  1 Container Oral BID BM Rise Patience, MD      . feeding supplement (PRO-STAT SUGAR FREE 64) liquid 30 mL  30 mL Oral TID WC Rise Patience, MD   30 mL at 08/15/18 1109  . fenofibrate tablet 160 mg  160 mg Oral Daily Rise Patience, MD   160 mg at 08/15/18 1110  . ferrous sulfate tablet 325 mg  325 mg Oral Q breakfast Rise Patience, MD   325 mg at 08/15/18 1105  . gabapentin (NEURONTIN) capsule 300 mg  300 mg Oral Daily Rise Patience, MD   300 mg at 08/15/18 1105  . heparin injection 5,000 Units  5,000 Units Subcutaneous Q8H Rise Patience, MD      . hydrALAZINE (APRESOLINE) tablet 25 mg  25 mg Oral Q8H Rise Patience, MD   Stopped at 08/15/18 [phone]  . HYDROmorphone (DILAUDID) tablet 2 mg  2 mg Oral Q4H PRN Rise Patience, MD   2 mg at 08/15/18 1105  .  lipase/protease/amylase (CREON) capsule 12,000 Units  12,000 Units Oral TID WC Rise Patience, MD   12,000 Units at 08/15/18 1110  . magnesium sulfate IVPB 2 g 50 mL  2 g Intravenous Once Samuella Cota, MD      . mometasone-formoterol [facility]) 200-5 MCG/ACT inhaler 2 puff  2 puff Inhalation BID Rise Patience, MD   2 puff at 08/15/18 9604  . pantoprazole (PROTONIX) EC tablet 40 mg  40 mg Oral Daily Rise Patience, MD   40 mg at 08/15/18 1106  . polyethylene glycol (MIRALAX / GLYCOLAX) packet 17 g  17 g Oral Daily PRN Rise Patience, MD      . vitamin B-12 (CYANOCOBALAMIN) tablet 1,000 mcg  1,000 mcg Oral Daily Rise Patience, MD   1,000 mcg at 08/15/18 1105   Current Outpatient Medications  Medication Sig Dispense Refill  . acetaminophen (TYLENOL) 325 MG tablet Take 325 mg by mouth every 6 (six) hours as needed (for pain).    Marland Kitchen albuterol (PROVENTIL HFA;VENTOLIN HFA) 108 (90 Base) MCG/ACT inhaler Inhale 2 puffs into the lungs every 4 (four) hours as needed for wheezing or shortness of breath. ProAir 1 each 0  . allopurinol (ZYLOPRIM) 100 MG tablet Take 100 mg by mouth daily.  2  . amLODipine (NORVASC) 10 MG tablet Take 10 mg by mouth daily.      Marland Kitchen atorvastatin (LIPITOR) 40 MG tablet Take 40 mg by mouth daily.     . budesonide-formoterol (SYMBICORT) 160-4.5 MCG/ACT inhaler Inhale 2 puffs into the lungs 2 (two) times daily as needed (for flares).    . carvedilol (COREG) 12.5 MG tablet Take  1 tablet (12.5 mg total) by mouth 2 (two) times daily with a meal.    . Cholecalciferol (VITAMIN D3) 5000 units TABS Take 5,000 Units by mouth daily.    . clopidogrel (PLAVIX) 75 MG tablet Take 1 tablet (75 mg total) by mouth daily with breakfast. 90 tablet 3  . Cyanocobalamin (VITAMIN B-12) 5000 MCG SUBL Place 5,000 mcg under the tongue daily.    . fenofibrate 160 MG tablet Take 1 tablet (160 mg total) by mouth daily. 30 tablet 1  . ferrous sulfate 325 (65 FE) MG tablet Take 1  tablet (325 mg total) by mouth daily with breakfast. 30 tablet 0  . furosemide (LASIX) 40 MG tablet Take 1 tablet (40 mg total) by mouth daily as needed (weight gain).    Marland Kitchen gabapentin (NEURONTIN) 300 MG capsule Take 1 capsule (300 mg total) by mouth daily. TAKE 1 CAPSULE(300 MG) BY MOUTH TWICE DAILY (Patient taking differently: Take 300 mg by mouth 2 (two) times daily. ) 60 capsule 0  . hydrALAZINE (APRESOLINE) 25 MG tablet Take 1 tablet (25 mg total) by mouth every 8 (eight) hours.    Marland Kitchen HYDROmorphone (DILAUDID) 2 MG tablet Take 2 mg by mouth every 4 (four) hours as needed for severe pain.    Marland Kitchen lipase/protease/amylase (CREON) 12000 units CPEP capsule Take 1 capsule (12,000 Units total) by mouth 3 (three) times daily with meals. 90 capsule 0  . loperamide (IMODIUM A-D) 2 MG tablet Take 2 mg by mouth 4 (four) times daily as needed for diarrhea or loose stools.    Marland Kitchen NARCAN 4 MG/0.1ML LIQD nasal spray kit Place 1 spray into the nose once.   0  . pantoprazole (PROTONIX) 40 MG tablet Take 1 tablet (40 mg total) by mouth daily. 30 tablet 0  . polyethylene glycol (MIRALAX / GLYCOLAX) packet Take 17 g by mouth daily as needed for mild constipation. 14 each 0  . Amino Acids-Protein Hydrolys (FEEDING SUPPLEMENT, PRO-STAT SUGAR FREE 64,) LIQD Take 30 mLs by mouth 3 (three) times daily with meals. 900 mL 0  . feeding supplement (BOOST / RESOURCE BREEZE) LIQD Take 1 Container by mouth 2 (two) times daily.    . vitamin B-12 100 MCG tablet Take 1 tablet (100 mcg total) by mouth daily. (Patient not taking: Reported on 08/14/2018)      Allergies as of 08/14/2018 - Review Complete 08/14/2018  Allergen Reaction Noted  . Isosorbide Other (See Comments) 10/28/2015  . Isosorbide nitrate Other (See Comments) 10/28/2015  . Oxycodone-acetaminophen Itching 10/28/2015  . Codeine Itching 10/28/2015  . Contrast media [iodinated diagnostic agents] Itching and Other (See Comments) 05/29/2012  . Ioxaglate Itching and Other (See  Comments) 02/24/2015  . Metrizamide Itching and Other (See Comments) 07/03/2014  . Percocet [oxycodone-acetaminophen] Itching and Other (See Comments) 09/04/2013    Family History  Problem Relation Age of Onset  . Emphysema Mother   . Heart disease Mother        before age 61  . Hypertension Mother   . Hyperlipidemia Mother   . Heart attack Mother   . AAA (abdominal aortic aneurysm) Mother        rupture  . Heart attack Father 66  . Emphysema Father   . Heart disease Father        before age 79  . Hyperlipidemia Father   . Hypertension Father   . Peripheral vascular disease Father   . Diabetes Brother   . Heart disease Brother  before age 78  . Hyperlipidemia Brother   . Hypertension Brother   . Heart attack Brother        CABG  . Heart disease Other        Vascular disease in grandparents, uncles and dad  . Colon cancer Neg Hx     Social History   Socioeconomic History  . Marital status: Widowed    Spouse name: Not on file  . Number of children: Not on file  . Years of education: Not on file  . Highest education level: Not on file  Occupational History  . Occupation: Scientist, water quality in past    Employer: DISABLED  Social Needs  . Financial resource strain: Not on file  . Food insecurity:    Worry: Not on file    Inability: Not on file  . Transportation needs:    Medical: Not on file    Non-medical: Not on file  Tobacco Use  . Smoking status: Current Every Day Smoker    Years: 50.00    Types: Cigarettes  . Smokeless tobacco: Never Used  . Tobacco comment: 3/4 pk per day  Substance and Sexual Activity  . Alcohol use: Yes    Alcohol/week: 0.0 standard drinks    Comment: occasionally  . Drug use: Yes    Types: Marijuana  . Sexual activity: Not Currently    Birth control/protection: None  Lifestyle  . Physical activity:    Days per week: Not on file    Minutes per session: Not on file  . Stress: Not on file  Relationships  . Social connections:     Talks on phone: Not on file    Gets together: Not on file    Attends religious service: Not on file    Active member of club or organization: Not on file    Attends meetings of clubs or organizations: Not on file    Relationship status: Not on file  . Intimate partner violence:    Fear of current or ex partner: Not on file    Emotionally abused: Not on file    Physically abused: Not on file    Forced sexual activity: Not on file  Other Topics Concern  . Not on file  Social History Narrative   Widowed in 2009.    Review of Systems: Gen: Denies fever sweats chills admits to anorexia fatigue weakness and lower extremity swelling HEENT: No visual complaints, No history of Retinopathy. Normal external appearance No Epistaxis or Sore throat. No sinusitis.   CV: Denies chest pain, angina, palpitations, syncope, orthopnea, PND, peripheral edema, and claudication. Resp: Dyspnea to conversation dyspnea at rest difficulty lying flat GI: Denies vomiting blood, jaundice, and fecal incontinence.   Denies dysphagia or odynophagia. GU : Denies urinary burning, blood in urine, urinary frequency, urinary hesitancy, nocturnal urination, and urinary incontinence.  No renal calculi. MS: Denies joint pain, limitation of movement, and swelling, stiffness, low back pain, extremity pain. Denies muscle weakness, cramps, atrophy.  No use of non steroidal antiinflammatory drugs. Derm: Denies rash, some itching, some dry skin, no hives, moles, warts, or unhealing ulcers.  Psych: Denies depression, anxiety, memory loss, suicidal ideation, hallucinations, paranoia, and confusion. Heme: Denies bruising, bleeding, and enlarged lymph nodes. Neuro: No headache.  No diplopia. No dysarthria.  No dysphasia.  No history of CVA.  No Seizures. No paresthesias.  No weakness. Endocrine No DM.  No Thyroid disease.  No Adrenal disease.  Physical Exam: Vital signs in last 24 hours: Temp:  [  98.3 F (36.8 C)-98.4 F (36.9 C)]  98.4 F (36.9 C) (12/10 0600) Pulse Rate:  [58-72] 63 (12/10 1100) Resp:  [12-21] 13 (12/10 1100) BP: (103-146)/(36-100) 143/55 (12/10 1100) SpO2:  [85 %-100 %] 97 % (12/10 1100) Weight:  [63.5 kg] 63.5 kg (12/09 1615)   General: Elderly appearing ill cachectic lady Head:  Normocephalic and atraumatic. Eyes:  Sclera clear, no icterus.   Conjunctiva pale Ears:  Normal auditory acuity. Nose:  No deformity, discharge,  or lesions. Mouth:  No deformity or lesions, dentition normal. Neck:  Supple; no masses or thyromegaly.  JVP mildly elevated Lungs:  Diminished breath sounds at bases with some rales. Heart: 3 out of 6 systolic murmur Abdomen: Distended soft nontender Msk:  Symmetrical without gross deformities. Normal posture. Pulses:  No carotid, renal, femoral bruits. DP and PT symmetrical and equal Extremities: 2+ pitting edema left AV fistula with thrill and bruit Neurologic:  Alert and  oriented x4;  grossly normal neurologically. Skin:  Intact without significant lesions or rashes. Cervical Nodes:  No significant cervical adenopathy. Psych:  Alert and cooperative. Normal mood and affect.  Intake/Output from previous day: 12/09 0701 - 12/10 0700 In: 100 [IV Piggyback:100] Out: -  Intake/Output this shift: No intake/output data recorded.  Lab Results: Recent Labs    08/14/18 2239 08/15/18 0843  WBC 9.3 9.3  HGB 8.7* 7.8*  HCT 27.4* 24.9*  PLT 259 258   BMET Recent Labs    08/14/18 1709 08/14/18 2239 08/15/18 0843  NA 137 136 138  K 2.7* 2.2* 2.1*  CL 99 100 101  CO2 22 17* 19*  GLUCOSE 202* 115* 119*  BUN 122* 121* 122*  CREATININE 3.43* 3.57* 3.68*  CALCIUM 7.3* 7.2* 7.1*   LFT Recent Labs    08/15/18 0843  PROT 5.4*  ALBUMIN 2.4*  AST 14*  ALT 7  ALKPHOS 35*  BILITOT 1.1  BILIDIR 0.4*  IBILI 0.7   PT/INR No results for input(s): LABPROT, INR in the last 72 hours. Hepatitis Panel No results for input(s): HEPBSAG, HCVAB, HEPAIGM, HEPBIGM in  the last 72 hours.  Studies/Results: Dg Chest 2 View  Result Date: 08/14/2018 CLINICAL DATA:  Initial evaluation for market leg swelling. EXAM: CHEST - 2 VIEW COMPARISON:  Prior radiograph from 07/24/2018. FINDINGS: Median sternotomy wires underlying CABG markers, valvular prosthesis, and left atrial appendage clip noted. Vascular stent at the upper left mediastinum. Severe cardiomegaly, stable. Mediastinal silhouette within normal limits. Aortic atherosclerosis. Lungs mildly hypoinflated. Diffuse vascular congestion with interstitial prominence compatible with diffuse pulmonary interstitial edema. Superimposed mild bibasilar subsegmental atelectasis. No consolidative opacity. No pleural effusion. No pneumothorax. No acute osseous abnormality. IMPRESSION: 1. Cardiomegaly with mild diffuse pulmonary interstitial edema. 2. Sequelae of prior CABG. 3. Aortic atherosclerosis. Electronically Signed   By: Jeannine Boga M.D.   On: 08/14/2018 22:26    Assessment/Plan:  Chronic kidney disease stage V, I believe the patient is end-stage renal disease and will need to start dialysis she does have an AV fistula that appears to be mature she states that at the last dialysis session there was difficulty cannulating.  Bleed we do need to proceed with dialysis and attempt cannulation of this fistula.  It appears to be placed in November 03 2017.  Will need to stop clip process.  Hypertension/volume will aim to challenge patient with dialysis for fluid removal  Anemia we will check iron studies start darbepoetin with dialysis  Bones check PTH  Diastolic heart failure will aim for  fluid removal with dialysis  Hypokalemia would replete patient's potassium.  History of GIB bleed with EGD and cautery 2011  History gastroesophageal reflux disease.   LOS: 0 Sherril Croon '@TODAY''@1'$ :37 PM

## 2018-08-16 DIAGNOSIS — I714 Abdominal aortic aneurysm, without rupture: Secondary | ICD-10-CM | POA: Diagnosis present

## 2018-08-16 DIAGNOSIS — D631 Anemia in chronic kidney disease: Secondary | ICD-10-CM | POA: Diagnosis present

## 2018-08-16 DIAGNOSIS — I252 Old myocardial infarction: Secondary | ICD-10-CM | POA: Diagnosis not present

## 2018-08-16 DIAGNOSIS — M109 Gout, unspecified: Secondary | ICD-10-CM | POA: Diagnosis present

## 2018-08-16 DIAGNOSIS — N2581 Secondary hyperparathyroidism of renal origin: Secondary | ICD-10-CM | POA: Diagnosis present

## 2018-08-16 DIAGNOSIS — E785 Hyperlipidemia, unspecified: Secondary | ICD-10-CM | POA: Diagnosis present

## 2018-08-16 DIAGNOSIS — E1122 Type 2 diabetes mellitus with diabetic chronic kidney disease: Secondary | ICD-10-CM | POA: Diagnosis present

## 2018-08-16 DIAGNOSIS — K219 Gastro-esophageal reflux disease without esophagitis: Secondary | ICD-10-CM | POA: Diagnosis present

## 2018-08-16 DIAGNOSIS — K861 Other chronic pancreatitis: Secondary | ICD-10-CM | POA: Diagnosis present

## 2018-08-16 DIAGNOSIS — I5032 Chronic diastolic (congestive) heart failure: Secondary | ICD-10-CM | POA: Diagnosis present

## 2018-08-16 DIAGNOSIS — N179 Acute kidney failure, unspecified: Secondary | ICD-10-CM | POA: Diagnosis present

## 2018-08-16 DIAGNOSIS — G8929 Other chronic pain: Secondary | ICD-10-CM | POA: Diagnosis present

## 2018-08-16 DIAGNOSIS — J9611 Chronic respiratory failure with hypoxia: Secondary | ICD-10-CM | POA: Diagnosis present

## 2018-08-16 DIAGNOSIS — I132 Hypertensive heart and chronic kidney disease with heart failure and with stage 5 chronic kidney disease, or end stage renal disease: Secondary | ICD-10-CM | POA: Diagnosis present

## 2018-08-16 DIAGNOSIS — N186 End stage renal disease: Secondary | ICD-10-CM | POA: Diagnosis present

## 2018-08-16 DIAGNOSIS — E876 Hypokalemia: Secondary | ICD-10-CM | POA: Diagnosis present

## 2018-08-16 DIAGNOSIS — J449 Chronic obstructive pulmonary disease, unspecified: Secondary | ICD-10-CM | POA: Diagnosis present

## 2018-08-16 DIAGNOSIS — I480 Paroxysmal atrial fibrillation: Secondary | ICD-10-CM | POA: Diagnosis present

## 2018-08-16 DIAGNOSIS — E1151 Type 2 diabetes mellitus with diabetic peripheral angiopathy without gangrene: Secondary | ICD-10-CM | POA: Diagnosis present

## 2018-08-16 DIAGNOSIS — F17210 Nicotine dependence, cigarettes, uncomplicated: Secondary | ICD-10-CM | POA: Diagnosis present

## 2018-08-16 DIAGNOSIS — I2510 Atherosclerotic heart disease of native coronary artery without angina pectoris: Secondary | ICD-10-CM | POA: Diagnosis present

## 2018-08-16 DIAGNOSIS — J810 Acute pulmonary edema: Secondary | ICD-10-CM | POA: Diagnosis present

## 2018-08-16 DIAGNOSIS — N185 Chronic kidney disease, stage 5: Secondary | ICD-10-CM | POA: Diagnosis not present

## 2018-08-16 DIAGNOSIS — I700 Atherosclerosis of aorta: Secondary | ICD-10-CM | POA: Diagnosis present

## 2018-08-16 LAB — GLUCOSE, CAPILLARY
Glucose-Capillary: 136 mg/dL — ABNORMAL HIGH (ref 70–99)
Glucose-Capillary: 143 mg/dL — ABNORMAL HIGH (ref 70–99)

## 2018-08-16 LAB — BASIC METABOLIC PANEL
Anion gap: 15 (ref 5–15)
BUN: 84 mg/dL — ABNORMAL HIGH (ref 8–23)
CHLORIDE: 102 mmol/L (ref 98–111)
CO2: 20 mmol/L — ABNORMAL LOW (ref 22–32)
Calcium: 7.7 mg/dL — ABNORMAL LOW (ref 8.9–10.3)
Creatinine, Ser: 2.83 mg/dL — ABNORMAL HIGH (ref 0.44–1.00)
GFR calc non Af Amer: 17 mL/min — ABNORMAL LOW (ref >60)
GFR, EST AFRICAN AMERICAN: 19 mL/min — AB (ref >60)
Glucose, Bld: 136 mg/dL — ABNORMAL HIGH (ref 70–99)
POTASSIUM: 2.7 mmol/L — AB (ref 3.5–5.1)
Sodium: 137 mmol/L (ref 135–145)

## 2018-08-16 LAB — PARATHYROID HORMONE, INTACT (NO CA): PTH: 80 pg/mL — AB (ref 15–65)

## 2018-08-16 LAB — MAGNESIUM: Magnesium: 1.4 mg/dL — ABNORMAL LOW (ref 1.7–2.4)

## 2018-08-16 MED ORDER — METHYLPREDNISOLONE SODIUM SUCC 125 MG IJ SOLR
INTRAMUSCULAR | Status: AC
  Filled 2018-08-16: qty 2

## 2018-08-16 MED ORDER — MAGNESIUM SULFATE 2 GM/50ML IV SOLN
2.0000 g | Freq: Once | INTRAVENOUS | Status: AC
  Administered 2018-08-16: 2 g via INTRAVENOUS
  Filled 2018-08-16: qty 50

## 2018-08-16 MED ORDER — SODIUM CHLORIDE 0.9 % IV SOLN
125.0000 mg | INTRAVENOUS | Status: DC
  Administered 2018-08-17 – 2018-08-22 (×3): 125 mg via INTRAVENOUS
  Filled 2018-08-16 (×4): qty 10

## 2018-08-16 MED ORDER — INSULIN ASPART 100 UNIT/ML ~~LOC~~ SOLN
0.0000 [IU] | Freq: Three times a day (TID) | SUBCUTANEOUS | Status: DC
  Administered 2018-08-17: 1 [IU] via SUBCUTANEOUS
  Administered 2018-08-18: 2 [IU] via SUBCUTANEOUS
  Administered 2018-08-18: 1 [IU] via SUBCUTANEOUS
  Administered 2018-08-21: 2 [IU] via SUBCUTANEOUS
  Administered 2018-08-22: 1 [IU] via SUBCUTANEOUS

## 2018-08-16 MED ORDER — CHLORHEXIDINE GLUCONATE CLOTH 2 % EX PADS: 6.0000 | MEDICATED_PAD | Freq: Every day | CUTANEOUS | Status: DC

## 2018-08-16 MED ORDER — HYDROMORPHONE HCL 1 MG/ML IJ SOLN
0.5000 mg | Freq: Once | INTRAMUSCULAR | Status: AC
  Administered 2018-08-16: 0.5 mg via INTRAVENOUS
  Filled 2018-08-16: qty 1

## 2018-08-16 MED ORDER — PANCRELIPASE (LIP-PROT-AMYL) 12000-38000 UNITS PO CPEP
24000.0000 [IU] | ORAL_CAPSULE | Freq: Three times a day (TID) | ORAL | Status: DC
  Administered 2018-08-16 – 2018-08-22 (×15): 24000 [IU] via ORAL
  Filled 2018-08-16 (×14): qty 2

## 2018-08-16 MED ORDER — GABAPENTIN 300 MG PO CAPS
300.0000 mg | ORAL_CAPSULE | Freq: Every day | ORAL | Status: DC
  Administered 2018-08-17 – 2018-08-21 (×5): 300 mg via ORAL
  Filled 2018-08-16 (×5): qty 1

## 2018-08-16 MED ORDER — POTASSIUM CHLORIDE CRYS ER 20 MEQ PO TBCR
40.0000 meq | EXTENDED_RELEASE_TABLET | Freq: Three times a day (TID) | ORAL | Status: DC
  Administered 2018-08-16: 40 meq via ORAL
  Filled 2018-08-16: qty 2

## 2018-08-16 MED ORDER — ACETAMINOPHEN 325 MG PO TABS: 650.0000 mg | ORAL_TABLET | Freq: Four times a day (QID) | ORAL | Status: DC | PRN

## 2018-08-16 MED ORDER — POTASSIUM CHLORIDE CRYS ER 20 MEQ PO TBCR
20.0000 meq | EXTENDED_RELEASE_TABLET | Freq: Two times a day (BID) | ORAL | Status: AC
  Administered 2018-08-16 (×2): 20 meq via ORAL
  Filled 2018-08-16 (×2): qty 1

## 2018-08-16 MED ORDER — DIPHENHYDRAMINE HCL 50 MG/ML IJ SOLN
INTRAMUSCULAR | Status: AC
  Filled 2018-08-16: qty 1

## 2018-08-16 NOTE — Care Management Note (Signed)
Case Management Note [name] 42M [facility] [phone]  Patient Details  Name: [name] MRN: [id number] Date of Birth: 09/15/1950  Subjective/Objective:            Acute on chronic renal failure        Action/Plan: PTA pt home with home health. [facility] notified CM that they are no longer able to follow pt d/t multiple readmissions and ED visits. Will need PT and OT evaluations prior to discharge. Requiring hemodialysis this admit. Will continue to follow for transition of care needs.   Expected Discharge Date:                  Expected Discharge Plan:  [place]  In-House Referral:     Discharge planning Services  CM Consult  Post Acute Care Choice:    Choice offered to:     DME Arranged:    DME Agency:     HH Arranged:    [place] Agency:     Status of Service:  In process, will continue to follow  If discussed at Long Length of Stay Meetings, dates discussed:    Additional Comments:  Bartholomew Crews, RN 08/16/2018, 12:45 PM

## 2018-08-16 NOTE — Progress Notes (Signed)
[facility] ROUNDING NOTE   Subjective:   Back pain chronic requesting narcotics.  I have asked her to discuss this with her primary team.  Blood pressure 139/52 pulse 62 temperature 98.3 O2 sats 97% room air  Sodium 137 potassium 3.7 chloride 102 CO2 20 glucose 136 BUN 84 creatinine 2.83 calcium 7.7 magnesium 1.4 iron saturation 6% WBC 9.3 hemoglobin 7.8 platelets 258.   Objective:  Vital signs in last 24 hours:  Temp:  [97.9 F (36.6 C)-98.9 F (37.2 C)] 98.3 F (36.8 C) (12/11 0426) Pulse Rate:  [62-74] 62 (12/11 0426) Resp:  [10-19] 18 (12/11 0426) BP: (112-156)/(42-67) 139/52 (12/11 0426) SpO2:  [91 %-97 %] 97 % (12/11 0426) Weight:  [61 kg-62.4 kg] 61 kg (12/11 0455)  Weight change:  Filed Weights   08/15/18 1800 08/15/18 2128 08/16/18 0455  Weight: 61 kg 61 kg 61 kg    Intake/Output: I/O last 3 completed shifts: In: 220 [P.O.:120; IV Piggyback:100] Out: 1775 [Urine:75; Other:1700]   Intake/Output this shift:  No intake/output data recorded.   Awake alert no acute distress cachectic appearing lady CVS- RRR JVP mildly elevated. RS- CTA diminished at bases ABD- BS present soft non-distended EXT-2+ pitting edema left AV fistula with thrill and bruit   Basic Metabolic Panel: Recent Labs  Lab 08/14/18 1709 08/14/18 2239 08/15/18 0843 08/16/18 0622  NA 137 136 138 137  K 2.7* 2.2* 2.1* 2.7*  CL 99 100 101 102  CO2 22 17* 19* 20*  GLUCOSE 202* 115* 119* 136*  BUN 122* 121* 122* 84*  CREATININE 3.43* 3.57* 3.68* 2.83*  CALCIUM 7.3* 7.2* 7.1* 7.7*  MG  --   --  0.8* 1.4*    Liver Function Tests: Recent Labs  Lab 08/14/18 1709 08/15/18 0843  AST 12 14*  ALT 4 7  ALKPHOS 39 35*  BILITOT 0.9 1.1  PROT 6.4 5.4*  ALBUMIN 3.3* 2.4*   Recent Labs  Lab 08/15/18 0843  LIPASE 106*   No results for input(s): AMMONIA in the last 168 hours.  CBC: Recent Labs  Lab 08/14/18 2239 08/15/18 0843  WBC 9.3 9.3  NEUTROABS  --  8.3*  HGB  8.7* 7.8*  HCT 27.4* 24.9*  MCV 89.3 89.2  PLT 259 258    Cardiac Enzymes: No results for input(s): CKTOTAL, CKMB, CKMBINDEX, TROPONINI in the last 168 hours.  BNP: Invalid input(s): POCBNP  CBG: No results for input(s): GLUCAP in the last 168 hours.  Microbiology: Results for orders placed or performed during the hospital encounter of 07/08/18  MRSA PCR Screening     Status: None   Collection Time: 07/08/18  6:36 PM  Result Value Ref Range Status   MRSA by PCR NEGATIVE NEGATIVE Final    Comment:        The GeneXpert MRSA Assay (FDA approved for NASAL specimens only), is one component of a comprehensive MRSA colonization surveillance program. It is not intended to diagnose MRSA infection nor to guide or monitor treatment for MRSA infections. Performed at [facility], [place] [address], [place], [place] [address]   Culture, blood (Routine X 2) w Reflex to ID Panel     Status: None   Collection Time: 07/10/18 10:36 AM  Result Value Ref Range Status   Specimen Description   Final    BLOOD RIGHT ARM Performed at [facility] [address], [place], [place] [address]    Special Requests   Final    BOTTLES DRAWN  AEROBIC AND ANAEROBIC Blood Culture adequate volume Performed at [place] [address], [place], [place] [address]    Culture   Final    NO GROWTH 5 DAYS Performed at [facility], [place] [address], [place], [place] [address]    Report Status 07/15/2018 FINAL  Final  Culture, blood (Routine X 2) w Reflex to ID Panel     Status: None   Collection Time: 07/10/18 10:38 AM  Result Value Ref Range Status   Specimen Description   Final    BLOOD RIGHT HAND Performed at [facility] [address], [place], [place] [address]    Special Requests   Final    BOTTLES DRAWN AEROBIC ONLY Blood Culture adequate volume Performed at [facility] [address]  [place], [place] [address]    Culture   Final    NO GROWTH 5 DAYS Performed at [facility], [place] [address], [place], [place] [address]    Report Status 07/15/2018 FINAL  Final    Coagulation Studies: No results for input(s): LABPROT, INR in the last 72 hours.  Urinalysis: Recent Labs    08/15/18 0656  COLORURINE YELLOW  LABSPEC 1.006  PHURINE 5.0  GLUCOSEU NEGATIVE  HGBUR NEGATIVE  BILIRUBINUR NEGATIVE  KETONESUR NEGATIVE  PROTEINUR NEGATIVE  NITRITE NEGATIVE  LEUKOCYTESUR NEGATIVE      Imaging: Dg Chest 2 View  Result Date: 08/14/2018 CLINICAL DATA:  Initial evaluation for market leg swelling. EXAM: CHEST - 2 VIEW COMPARISON:  Prior radiograph from 07/24/2018. FINDINGS: Median sternotomy wires underlying CABG markers, valvular prosthesis, and left atrial appendage clip noted. Vascular stent at the upper left mediastinum. Severe cardiomegaly, stable. Mediastinal silhouette within normal limits. Aortic atherosclerosis. Lungs mildly hypoinflated. Diffuse vascular congestion with interstitial prominence compatible with diffuse pulmonary interstitial edema. Superimposed mild bibasilar subsegmental atelectasis. No consolidative opacity. No pleural effusion. No pneumothorax. No acute osseous abnormality. IMPRESSION: 1. Cardiomegaly with mild diffuse pulmonary interstitial edema. 2. Sequelae of prior CABG. 3. Aortic atherosclerosis. Electronically Signed   By: Jeannine Boga M.D.   On: 08/14/2018 22:26     Medications:   . sodium chloride    . sodium chloride     . allopurinol  100 mg Oral Daily  . amLODipine  10 mg Oral Daily  . atorvastatin  40 mg Oral Daily  . carvedilol  12.5 mg Oral BID WC  . Chlorhexidine Gluconate Cloth  6 each Topical Q0600  . clopidogrel  75 mg Oral Q breakfast  . darbepoetin (ARANESP) injection - NON-DIALYSIS  200 mcg Subcutaneous Q Tue-1800  . feeding supplement  1 Container Oral BID BM  . feeding supplement (PRO-STAT SUGAR FREE 64)  30 mL  Oral TID WC  . fenofibrate  160 mg Oral Daily  . ferrous sulfate  325 mg Oral Q breakfast  . gabapentin  300 mg Oral Daily  . heparin  5,000 Units Subcutaneous Q8H  . hydrALAZINE  25 mg Oral Q8H  . lipase/protease/amylase  12,000 Units Oral TID WC  . mometasone-formoterol  2 puff Inhalation BID  . pantoprazole  40 mg Oral Daily  . potassium chloride  40 mEq Oral Q8H  . vitamin B-12  1,000 mcg Oral Daily   sodium chloride, sodium chloride, albuterol, alteplase, calcium carbonate, heparin, HYDROmorphone, lidocaine (PF), lidocaine-prilocaine, pentafluoroprop-tetrafluoroeth, polyethylene glycol  Assessment/ Plan:   Chronic kidney disease stage V.  1.75 L removed with dialysis 08/15/2018.  Started clip process.  Hypertension/volume  continue with fluid removal with dialysis  Anemia iron stores low will replete with IV iron  Bones PTH 80  Diastolic dysfunction continue with fluid removal with dialysis  Hypokalemia replete with oral potassium supplementation increase dialysate potassium  History of GIB with EGD and cautery 2011  History of esophageal reflux disease  CAD stable continues on Coreg statin and Plavix  History of recurrent pancreatitis continues on pancreatic enzyme supplementation   LOS: 0 Sherril Croon @TODAY @10 :13 AM

## 2018-08-16 NOTE — Progress Notes (Signed)
CRITICAL VALUE ALERT  Critical value received:  K = 2.7  Date of notification:  08/16/18  Time of notification:  0740  Critical value read back:Yes.    Nurse who received alert:  K. Laurance Flatten  MD notified (1st page):  Dr. Sarajane Jews  Time of first page:  [phone]

## 2018-08-16 NOTE — Care Management Note (Deleted)
Case Management Note  Patient Details  Name: [name] MRN: [id number] Date of Birth: [birth year]  Subjective/Objective:                    Action/Plan:   Expected Discharge Date:                  Expected Discharge Plan:  [place]  In-House Referral:     Discharge planning Services  CM Consult  Post Acute Care Choice:    Choice offered to:     DME Arranged:    DME Agency:     HH Arranged:    HH Agency:     Status of Service:  In process, will continue to follow  If discussed at Long Length of Stay Meetings, dates discussed:    Additional Comments:  Bartholomew Crews, RN 08/16/2018, 1:10 PM

## 2018-08-16 NOTE — Progress Notes (Signed)
PROGRESS NOTE  [name] JKK:[id number] DOB: 1951-02-23 DOA: 08/14/2018 PCP: Bernerd Limbo, MD  Brief Narrative: 67 year old woman with recent admission for pancreatitis, CKD with recent dialysis but not yet end-stage, was seen by gastroenterology to follow-up pancreatitis.  Was noted to have anasarca and sent to the emergency department for further evaluation and treatment of this and hypokalemia.  Admitted for acute on chronic kidney disease with pulmonary edema and volume overload as well as hypokalemia.  Assessment/Plan Acute on chronic kidney disease stage V with pulmonary edema and volume overload --Status post HD 12/10, next HD 12/12.  Management per nephrology.  Severe hypokalemia, hypomagnesemia --Better but still low.  Replete both.  Recent acute pancreatitis --May be flaring, has increased back and flank pain.  Downgrade to liquid diet.  Check lipase and LFTs in a.m.  Increase pancreatic enzyme dose.  Anemia of chronic kidney disease, appears to be close to baseline. --Follow clinically.  Essential hypertension --Appears stable.  Continue hydralazine, amlodipine, carvedilol  CAD --Stable.  Continue Coreg, statin, Plavix  COPD, chronic hypoxic respiratory failure on 2 L at night --Stable.  Continue supplemental oxygen  Diabetes mellitus type 2 --Sliding scale insulin.  Aortic atherosclerosis   DVT prophylaxis: heparin Code Status: Full Family Communication: none Disposition Plan: home    Murray Hodgkins, MD  [facility] Direct contact: [phone] --Via amion app OR  --[email or web address]; password [email or web address]  7PM-7AM contact night coverage as above 08/16/2018, 12:51 PM  LOS: 0 days   Consultants:  Nephrology  Procedures:    Antimicrobials:    Interval history/Subjective: Reports some pain right side of her back radiating around to the abdomen.  Similar to previous pancreatic pain.  Objective: Vitals:  Vitals:   08/16/18 0900 08/16/18 1218    BP: 140/75   Pulse: 70   Resp: 19   Temp: 98.5 F (36.9 C)   SpO2: 98% 98%    Exam:  Constitutional:   . Appears calm, uncomfortable, nontoxic Eyes:  . pupils and irises appear normal ENMT:  . grossly normal hearing  Respiratory:  . CTA bilaterally, no w/r/r.  . Respiratory effort normal.  Cardiovascular:  . RRR, no m/r/g . 2+ bilateral LE extremity edema   Abdomen:  . Soft, mild generalized tenderness Musculoskeletal:  . Able to sit up without difficulty . No tenderness over the thoracic or lumbar spine Skin:  . Skin of the back and right flank appears unremarkable Psychiatric:  . Mental status o Mood, affect appropriate  I have personally reviewed the following:   Data: . Potassium 2.7, BUN 84, creatinine 2.83 after hemodialysis.  Magnesium 1.4.  Scheduled Meds: . allopurinol  100 mg Oral Daily  . amLODipine  10 mg Oral Daily  . atorvastatin  40 mg Oral Daily  . carvedilol  12.5 mg Oral BID WC  . Chlorhexidine Gluconate Cloth  6 each Topical Q0600  . clopidogrel  75 mg Oral Q breakfast  . darbepoetin (ARANESP) injection - NON-DIALYSIS  200 mcg Subcutaneous Q Tue-1800  . feeding supplement  1 Container Oral BID BM  . feeding supplement (PRO-STAT SUGAR FREE 64)  30 mL Oral TID WC  . fenofibrate  160 mg Oral Daily  . [START ON 08/17/2018] gabapentin  300 mg Oral QHS  . heparin  5,000 Units Subcutaneous Q8H  . hydrALAZINE  25 mg Oral Q8H  . lipase/protease/amylase  24,000 Units Oral TID WC  . mometasone-formoterol  2 puff Inhalation BID  . pantoprazole  40 mg Oral Daily  .  potassium chloride  20 mEq Oral BID  . potassium chloride  40 mEq Oral Q8H  . vitamin B-12  1,000 mcg Oral Daily   Continuous Infusions: . sodium chloride    . sodium chloride    . [START ON 08/17/2018] ferric gluconate (FERRLECIT/NULECIT) IV      Principal Problem:   Renal failure (ARF), acute on chronic (HCC) Active Problems:   Essential hypertension   Coronary artery disease  involving native heart with angina pectoris (HCC)   COPD with hypoxia (HCC)   Hypokalemia   Acute pulmonary edema (HCC)   ARF (acute renal failure) (HCC)   CKD (chronic kidney disease), stage V ([place])   Hypomagnesemia   LOS: 0 days

## 2018-08-17 DIAGNOSIS — I10 Essential (primary) hypertension: Secondary | ICD-10-CM

## 2018-08-17 DIAGNOSIS — N179 Acute kidney failure, unspecified: Principal | ICD-10-CM

## 2018-08-17 DIAGNOSIS — J810 Acute pulmonary edema: Secondary | ICD-10-CM

## 2018-08-17 DIAGNOSIS — J449 Chronic obstructive pulmonary disease, unspecified: Secondary | ICD-10-CM

## 2018-08-17 DIAGNOSIS — I25119 Atherosclerotic heart disease of native coronary artery with unspecified angina pectoris: Secondary | ICD-10-CM

## 2018-08-17 DIAGNOSIS — N185 Chronic kidney disease, stage 5: Secondary | ICD-10-CM

## 2018-08-17 DIAGNOSIS — N189 Chronic kidney disease, unspecified: Secondary | ICD-10-CM

## 2018-08-17 DIAGNOSIS — R0902 Hypoxemia: Secondary | ICD-10-CM

## 2018-08-17 DIAGNOSIS — E876 Hypokalemia: Secondary | ICD-10-CM

## 2018-08-17 LAB — COMPREHENSIVE METABOLIC PANEL
ALT: 7 U/L (ref 0–44)
ANION GAP: 13 (ref 5–15)
AST: 17 U/L (ref 15–41)
Albumin: 2.5 g/dL — ABNORMAL LOW (ref 3.5–5.0)
Alkaline Phosphatase: 33 U/L — ABNORMAL LOW (ref 38–126)
BUN: 92 mg/dL — ABNORMAL HIGH (ref 8–23)
CO2: 23 mmol/L (ref 22–32)
CREATININE: 3.02 mg/dL — AB (ref 0.44–1.00)
Calcium: 8.3 mg/dL — ABNORMAL LOW (ref 8.9–10.3)
Chloride: 103 mmol/L (ref 98–111)
GFR calc Af Amer: 18 mL/min — ABNORMAL LOW (ref >60)
GFR calc non Af Amer: 16 mL/min — ABNORMAL LOW (ref >60)
Glucose, Bld: 126 mg/dL — ABNORMAL HIGH (ref 70–99)
Potassium: 3.6 mmol/L (ref 3.5–5.1)
Sodium: 139 mmol/L (ref 135–145)
Total Bilirubin: 0.8 mg/dL (ref 0.3–1.2)
Total Protein: 5.5 g/dL — ABNORMAL LOW (ref 6.5–8.1)

## 2018-08-17 LAB — CBC
HCT: 26.3 % — ABNORMAL LOW (ref 36.0–46.0)
HEMOGLOBIN: 8.1 g/dL — AB (ref 12.0–15.0)
MCH: 27.8 pg (ref 26.0–34.0)
MCHC: 30.8 g/dL (ref 30.0–36.0)
MCV: 90.4 fL (ref 80.0–100.0)
Platelets: 290 10*3/uL (ref 150–400)
RBC: 2.91 MIL/uL — ABNORMAL LOW (ref 3.87–5.11)
RDW: 17.8 % — ABNORMAL HIGH (ref 11.5–15.5)
WBC: 7.9 10*3/uL (ref 4.0–10.5)
nRBC: 0 % (ref 0.0–0.2)

## 2018-08-17 LAB — GLUCOSE, CAPILLARY
GLUCOSE-CAPILLARY: 105 mg/dL — AB (ref 70–99)
Glucose-Capillary: 143 mg/dL — ABNORMAL HIGH (ref 70–99)
Glucose-Capillary: 156 mg/dL — ABNORMAL HIGH (ref 70–99)
Glucose-Capillary: 142 mg/dL — ABNORMAL HIGH (ref 70–99)

## 2018-08-17 LAB — MAGNESIUM: Magnesium: 2.1 mg/dL (ref 1.7–2.4)

## 2018-08-17 LAB — LIPASE, BLOOD: Lipase: 267 U/L — ABNORMAL HIGH (ref 11–51)

## 2018-08-17 MED ORDER — SODIUM CHLORIDE 0.9 % IV SOLN: 100.0000 mL | INTRAVENOUS | Status: DC | PRN

## 2018-08-17 MED ORDER — HYDROMORPHONE HCL 1 MG/ML IJ SOLN
0.5000 mg | INTRAMUSCULAR | Status: DC | PRN
  Administered 2018-08-17 – 2018-08-18 (×5): 0.5 mg via INTRAVENOUS
  Filled 2018-08-17 (×5): qty 1

## 2018-08-17 NOTE — Progress Notes (Signed)
PROGRESS NOTE    [name]  QQV:[id number] DOB: [birth year] DOA: 08/14/2018 PCP: Bernerd Limbo, MD   Brief Narrative:  HPI on 08/15/2018 by Dr. Garret Reddish [name] is a 67 y.o. female with history of CAD, abdominal aortic aneurysm status post repair, recently admitted for pancreatitis following which patient was again admitted with renal failure requiring dialysis and eventually was discharged home after doing well and was off dialysis and followed up with her primary care physician yesterday and was found to have increasing peripheral edema for which patient was given Lasix 120 mg.  Labs revealed low potassium and was advised to come to the ER.  Patient otherwise denies any chest pain shortness of breath nausea vomiting or diarrhea.  Interim history Patient admitted for acute on chronic kidney disease with pulmonary edema and volume overload as well as hypokalemia.  Nephrology consulted and appreciated, HD and CLIP process started.  Assessment & Plan   Acute on chronic kidney disease, stage V with pulmonary edema and volume overload -Nephrology consulted and appreciated -Hemodialysis started on 08/15/2018 with next session for today -Clip process started  Severe hypokalemia, hypomagnesemia -potassium replaced, currently 3.6- continue to monitor  -Magnesium will be rechecked today  Recent acute pancreatitis -Patient with back and flank pain which states she has been ongoing for several weeks -Lipase 267 -LFTs WNL -Continue Creon  Anemia of chronic kidney disease -hemoglobin currently 8.1, appears to be close to baseline between 8 and 9 -Continue Aranesp, ferric gluconate -Continue to monitor CBC  Coronary artery disease -Currently chest pain-free -Continue Plavix, statin, Coreg  Diabetes mellitus, type II  COPD with chronic hypoxic respiratory failure -Patient uses 2 L of home oxygen at night -Currently stable  Essential hypertension -BP currently stable.   Continue  Coreg, amlodipine, hydralazine  Chronic diastolic dysfunction -Echocardiogram 05/30/2018 shows an EF of 60 to 33%, grade 2 diastolic dysfunction -Continue volume control with hemodialysis  Aortic atherosclerosis  DVT Prophylaxis  Heparin  Code Status: Full  Family Communication: None at bedside  Disposition Plan: Admitted. Pending HD and CLIP process  Consultants Nephrology   Procedures  HD  Antibiotics   Anti-infectives (From admission, onward)   None      Subjective:   [name] seen and examined today.  Continues to complain of back pain in her abdomen and back and wanting to have IV Dilaudid for breakthrough pain.  Currently denies chest pain, shortness of breath, nausea or vomiting, diarrhea or constipation, dizziness or headache. Objective:   Vitals:   08/17/18 1316 08/17/18 1321 08/17/18 1330 08/17/18 1400  BP: (!) 143/55 (!) 132/52 (!) 132/52 (!) 136/55  Pulse: 61 62 62 65  Resp:      Temp:      TempSrc:      SpO2:      Weight:      Height:        Intake/Output Summary (Last 24 hours) at 08/17/2018 1456 Last data filed at 08/17/2018 1104 Gross per 24 hour  Intake 840 ml  Output 375 ml  Net 465 ml   Filed Weights   08/16/18 2126 08/17/18 1018 08/17/18 1301  Weight: 61.6 kg 61.6 kg 61.2 kg    Exam  General: Well developed, well nourished, NAD, appears stated age  HEENT: NCAT, mucous membranes moist.   Neck: Supple  Cardiovascular: S1 S2 auscultated, soft SEM, RRR  Respiratory: Clear to auscultation bilaterally with equal chest rise  Abdomen: Soft, nontender, nondistended, + bowel sounds  Extremities:  warm dry without cyanosis clubbing. +LE edema L>R  Neuro: AAOx3, nonfocal  Psych: Normal affect and demeanor   Data Reviewed: I have personally reviewed following labs and imaging studies  CBC: Recent Labs  Lab 08/14/18 2239 08/15/18 0843 08/17/18 1327  WBC 9.3 9.3 7.9  NEUTROABS  --  8.3*  --   HGB 8.7* 7.8* 8.1*    HCT 27.4* 24.9* 26.3*  MCV 89.3 89.2 90.4  PLT 259 258 417   Basic Metabolic Panel: Recent Labs  Lab 08/14/18 1709 08/14/18 2239 08/15/18 0843 08/16/18 0622 08/17/18 0339  NA 137 136 138 137 139  K 2.7* 2.2* 2.1* 2.7* 3.6  CL 99 100 101 102 103  CO2 22 17* 19* 20* 23  GLUCOSE 202* 115* 119* 136* 126*  BUN 122* 121* 122* 84* 92*  CREATININE 3.43* 3.57* 3.68* 2.83* 3.02*  CALCIUM 7.3* 7.2* 7.1* 7.7* 8.3*  MG  --   --  0.8* 1.4*  --    GFR: Estimated Creatinine Clearance: 17.4 mL/min (A) (by C-G formula based on SCr of 3.02 mg/dL (H)). Liver Function Tests: Recent Labs  Lab 08/14/18 1709 08/15/18 0843 08/17/18 0339  AST 12 14* 17  ALT 4 7 7   ALKPHOS 39 35* 33*  BILITOT 0.9 1.1 0.8  PROT 6.4 5.4* 5.5*  ALBUMIN 3.3* 2.4* 2.5*   Recent Labs  Lab 08/15/18 0843 08/17/18 0339  LIPASE 106* 267*   No results for input(s): AMMONIA in the last 168 hours. Coagulation Profile: No results for input(s): INR, PROTIME in the last 168 hours. Cardiac Enzymes: No results for input(s): CKTOTAL, CKMB, CKMBINDEX, TROPONINI in the last 168 hours. BNP (last 3 results) No results for input(s): PROBNP in the last 8760 hours. HbA1C: No results for input(s): HGBA1C in the last 72 hours. CBG: Recent Labs  Lab 08/16/18 1803 08/16/18 2132 08/17/18 0741 08/17/18 1125  GLUCAP 136* 143* 143* 156*   Lipid Profile: No results for input(s): CHOL, HDL, LDLCALC, TRIG, CHOLHDL, LDLDIRECT in the last 72 hours. Thyroid Function Tests: No results for input(s): TSH, T4TOTAL, FREET4, T3FREE, THYROIDAB in the last 72 hours. Anemia Panel: Recent Labs    08/15/18 1435  TIBC 295  IRON 19*   Urine analysis:    Component Value Date/Time   COLORURINE YELLOW 08/15/2018 [place] 08/15/2018 0656   LABSPEC 1.006 08/15/2018 0656   PHURINE 5.0 08/15/2018 0656   GLUCOSEU NEGATIVE 08/15/2018 0656   HGBUR NEGATIVE 08/15/2018 0656   BILIRUBINUR NEGATIVE 08/15/2018 [place] 08/15/2018 0656   PROTEINUR NEGATIVE 08/15/2018 0656   UROBILINOGEN 0.2 03/04/2011 2107   NITRITE NEGATIVE 08/15/2018 0656   LEUKOCYTESUR NEGATIVE 08/15/2018 0656   Sepsis Labs: @LABRCNTIP (procalcitonin:4,lacticidven:4)  )No results found for this or any previous visit (from the past 240 hour(s)).    Radiology Studies: No results found.   Scheduled Meds: . allopurinol  100 mg Oral Daily  . amLODipine  10 mg Oral Daily  . atorvastatin  40 mg Oral Daily  . carvedilol  12.5 mg Oral BID WC  . Chlorhexidine Gluconate Cloth  6 each Topical Q0600  . clopidogrel  75 mg Oral Q breakfast  . darbepoetin (ARANESP) injection - NON-DIALYSIS  200 mcg Subcutaneous Q Tue-1800  . feeding supplement  1 Container Oral BID BM  . feeding supplement (PRO-STAT SUGAR FREE 64)  30 mL Oral TID WC  . fenofibrate  160 mg Oral Daily  . gabapentin  300 mg Oral QHS  . heparin  5,000 Units Subcutaneous Q8H  . hydrALAZINE  25 mg Oral Q8H  . insulin aspart  0-9 Units Subcutaneous TID WC  . lipase/protease/amylase  24,000 Units Oral TID WC  . mometasone-formoterol  2 puff Inhalation BID  . pantoprazole  40 mg Oral Daily  . vitamin B-12  1,000 mcg Oral Daily   Continuous Infusions: . sodium chloride    . sodium chloride    . sodium chloride    . sodium chloride    . ferric gluconate (FERRLECIT/NULECIT) IV       LOS: 1 day   Time Spent in minutes   30 minutes  Bricyn Labrada D.O. on 08/17/2018 at 2:56 PM  Between 7am to 7pm - Please see pager noted on [email or web address]  After 7pm go to [email or web address]  And look for the night coverage person covering for me after hours  [facility] Office  [phone]

## 2018-08-17 NOTE — Progress Notes (Signed)
[name] ROUNDING NOTE   Subjective:   Appears to be doing a little better this morning.  She is very ambivalent about proceeding with dialysis.  She appears to understand the importance of this.  Blood pressure 144/67 pulse 57 temperature 98.5 O2 sats 91% room air  Sodium 139 potassium 3.6 chloride 103 CO2 23 BUN 92 creatinine 3.02 glucose 126 calcium 8.3 albumin 2.5 lipase 267 AST 17 ALT 7 bilirubin 0.8 iron saturation 6% hemoglobin 7.8 WBC 9.3 platelets 258   Objective:  Vital signs in last 24 hours:  Temp:  [98 F (36.7 C)-99.4 F (37.4 C)] 98.5 F (36.9 C) (12/12 0736) Pulse Rate:  [57-68] 57 (12/12 0736) Resp:  [15-20] 18 (12/12 0736) BP: (132-146)/(44-73) 144/67 (12/12 0736) SpO2:  [91 %-99 %] 91 % (12/12 0906) Weight:  [61.6 kg] 61.6 kg (12/11 2126)  Weight change: -0.8 kg Filed Weights   08/15/18 2128 08/16/18 0455 08/16/18 2126  Weight: 61 kg 61 kg 61.6 kg    Intake/Output: I/O last 3 completed shifts: In: 720 [P.O.:720] Out: 400 [Urine:400]   Intake/Output this shift:  No intake/output data recorded.   Awake alert no acute distress cachectic appearing lady CVS- RRR JVP mildly elevated. RS- CTA diminished at bases ABD- BS present soft non-distended EXT-2+ pitting edema left AV fistula with thrill and bruit   Basic Metabolic Panel: Recent Labs  Lab 08/14/18 1709 08/14/18 2239 08/15/18 0843 08/16/18 0622 08/17/18 0339  NA 137 136 138 137 139  K 2.7* 2.2* 2.1* 2.7* 3.6  CL 99 100 101 102 103  CO2 22 17* 19* 20* 23  GLUCOSE 202* 115* 119* 136* 126*  BUN 122* 121* 122* 84* 92*  CREATININE 3.43* 3.57* 3.68* 2.83* 3.02*  CALCIUM 7.3* 7.2* 7.1* 7.7* 8.3*  MG  --   --  0.8* 1.4*  --     Liver Function Tests: Recent Labs  Lab 08/14/18 1709 08/15/18 0843 08/17/18 0339  AST 12 14* 17  ALT 4 7 7   ALKPHOS 39 35* 33*  BILITOT 0.9 1.1 0.8  PROT 6.4 5.4* 5.5*  ALBUMIN 3.3* 2.4* 2.5*   Recent Labs  Lab 08/15/18 0843  08/17/18 0339  LIPASE 106* 267*   No results for input(s): AMMONIA in the last 168 hours.  CBC: Recent Labs  Lab 08/14/18 2239 08/15/18 0843  WBC 9.3 9.3  NEUTROABS  --  8.3*  HGB 8.7* 7.8*  HCT 27.4* 24.9*  MCV 89.3 89.2  PLT 259 258    Cardiac Enzymes: No results for input(s): CKTOTAL, CKMB, CKMBINDEX, TROPONINI in the last 168 hours.  BNP: Invalid input(s): POCBNP  CBG: Recent Labs  Lab 08/16/18 1803 08/16/18 2132 08/17/18 0741  GLUCAP 136* 143* 143*    Microbiology: Results for orders placed or performed during the hospital encounter of 07/08/18  MRSA PCR Screening     Status: None   Collection Time: 07/08/18  6:36 PM  Result Value Ref Range Status   MRSA by PCR NEGATIVE NEGATIVE Final    Comment:        The GeneXpert MRSA Assay (FDA approved for NASAL specimens only), is one component of a comprehensive MRSA colonization surveillance program. It is not intended to diagnose MRSA infection nor to guide or monitor treatment for MRSA infections. Performed at [facility], [place] [address] [place], [place] [address]   Culture, blood (Routine X 2) w Reflex to ID Panel     Status: None   Collection Time: 07/10/18 10:36  AM  Result Value Ref Range Status   Specimen Description   Final    BLOOD RIGHT ARM Performed at [place] [address], [place], [place] [address]    Special Requests   Final    BOTTLES DRAWN AEROBIC AND ANAEROBIC Blood Culture adequate volume Performed at [place] [address], [place], [place] [address]    Culture   Final    NO GROWTH 5 DAYS Performed at [facility], [place] [address] [place], [place] [address]    Report Status 07/15/2018 FINAL  Final  Culture, blood (Routine X 2) w Reflex to ID Panel     Status: None   Collection Time: 07/10/18 10:38 AM  Result Value Ref Range Status   Specimen Description   Final    BLOOD RIGHT HAND Performed at [place] [address], [place], [place] [address]    Special Requests   Final    BOTTLES DRAWN AEROBIC ONLY Blood Culture adequate volume Performed at [place] [address], [place], [place] [address]    Culture   Final    NO GROWTH 5 DAYS Performed at [facility], [place] [address], [place], [place] [address]    Report Status 07/15/2018 FINAL  Final    Coagulation Studies: No results for input(s): LABPROT, INR in the last 72 hours.  Urinalysis: Recent Labs    08/15/18 0656  COLORURINE YELLOW  LABSPEC 1.006  PHURINE 5.0  GLUCOSEU NEGATIVE  HGBUR NEGATIVE  BILIRUBINUR NEGATIVE  KETONESUR NEGATIVE  PROTEINUR NEGATIVE  NITRITE NEGATIVE  LEUKOCYTESUR NEGATIVE      Imaging: No results found.   Medications:   . sodium chloride    . sodium chloride    . ferric gluconate (FERRLECIT/NULECIT) IV     . allopurinol  100 mg Oral Daily  . amLODipine  10 mg Oral Daily  . atorvastatin  40 mg Oral Daily  . carvedilol  12.5 mg Oral BID WC  . Chlorhexidine Gluconate Cloth  6 each Topical Q0600  . clopidogrel  75 mg Oral Q breakfast  . darbepoetin (ARANESP) injection - NON-DIALYSIS  200 mcg Subcutaneous Q Tue-1800  . feeding supplement  1 Container Oral BID BM  . feeding supplement (PRO-STAT SUGAR FREE 64)  30 mL Oral TID WC  . fenofibrate  160 mg Oral Daily  . gabapentin  300 mg Oral QHS  . heparin  5,000 Units Subcutaneous Q8H  . hydrALAZINE  25 mg Oral Q8H  . insulin aspart  0-9 Units Subcutaneous TID WC  . lipase/protease/amylase  24,000 Units Oral TID WC  . mometasone-formoterol  2 puff Inhalation BID  . pantoprazole  40 mg Oral Daily  . vitamin B-12  1,000 mcg Oral Daily   sodium chloride, sodium chloride, acetaminophen, albuterol, alteplase, calcium carbonate, heparin, HYDROmorphone (DILAUDID) injection, HYDROmorphone, lidocaine (PF), lidocaine-prilocaine, pentafluoroprop-tetrafluoroeth, polyethylene  glycol  Assessment/ Plan:   Chronic kidney disease stage V.  1.75 L removed with dialysis 08/15/2018.  Started clip process.  She is planned for dialysis 08/17/2018  Hypertension/volume continue with fluid removal with dialysis  Anemia iron stores low will replete with IV iron  Bones PTH 80  Diastolic dysfunction continue with fluid removal with dialysis  Hypokalemia replete with oral potassium supplementation increase dialysate potassium  History of GIB with EGD and cautery 2011  History of esophageal reflux disease  CAD stable continues on Coreg statin and Plavix  History of  recurrent pancreatitis continues on pancreatic enzyme supplementation   LOS: [place] @TODAY @9 :36 AM

## 2018-08-18 ENCOUNTER — Inpatient Hospital Stay (HOSPITAL_COMMUNITY): Payer: Medicare Other

## 2018-08-18 ENCOUNTER — Encounter: Payer: Self-pay | Admitting: Gastroenterology

## 2018-08-18 ENCOUNTER — Telehealth: Payer: Self-pay

## 2018-08-18 LAB — CBC
HCT: 25.9 % — ABNORMAL LOW (ref 36.0–46.0)
Hemoglobin: 8 g/dL — ABNORMAL LOW (ref 12.0–15.0)
MCH: 28.1 pg (ref 26.0–34.0)
MCHC: 30.9 g/dL (ref 30.0–36.0)
MCV: 90.9 fL (ref 80.0–100.0)
Platelets: 258 10*3/uL (ref 150–400)
RBC: 2.85 MIL/uL — ABNORMAL LOW (ref 3.87–5.11)
RDW: 18 % — AB (ref 11.5–15.5)
WBC: 6.8 10*3/uL (ref 4.0–10.5)
nRBC: 0 % (ref 0.0–0.2)

## 2018-08-18 LAB — RENAL FUNCTION PANEL
Albumin: 2.4 g/dL — ABNORMAL LOW (ref 3.5–5.0)
Anion gap: 12 (ref 5–15)
BUN: 39 mg/dL — AB (ref 8–23)
CO2: 27 mmol/L (ref 22–32)
Calcium: 8.3 mg/dL — ABNORMAL LOW (ref 8.9–10.3)
Chloride: 102 mmol/L (ref 98–111)
Creatinine, Ser: 1.77 mg/dL — ABNORMAL HIGH (ref 0.44–1.00)
GFR calc Af Amer: 34 mL/min — ABNORMAL LOW (ref >60)
GFR calc non Af Amer: 30 mL/min — ABNORMAL LOW (ref >60)
Glucose, Bld: 103 mg/dL — ABNORMAL HIGH (ref 70–99)
Phosphorus: 2.7 mg/dL (ref 2.5–4.6)
Potassium: 3.4 mmol/L — ABNORMAL LOW (ref 3.5–5.1)
Sodium: 141 mmol/L (ref 135–145)

## 2018-08-18 LAB — GLUCOSE, CAPILLARY
Glucose-Capillary: 129 mg/dL — ABNORMAL HIGH (ref 70–99)
Glucose-Capillary: 169 mg/dL — ABNORMAL HIGH (ref 70–99)
Glucose-Capillary: 116 mg/dL — ABNORMAL HIGH (ref 70–99)
Glucose-Capillary: 131 mg/dL — ABNORMAL HIGH (ref 70–99)

## 2018-08-18 MED ORDER — ALUM & MAG HYDROXIDE-SIMETH 200-200-20 MG/5ML PO SUSP
30.0000 mL | ORAL | Status: DC | PRN
  Administered 2018-08-18: 30 mL via ORAL
  Filled 2018-08-18: qty 30

## 2018-08-18 MED ORDER — HYDROMORPHONE HCL 1 MG/ML IJ SOLN
0.5000 mg | INTRAMUSCULAR | Status: DC | PRN
  Administered 2018-08-18 – 2018-08-22 (×25): 0.5 mg via INTRAVENOUS
  Filled 2018-08-18 (×26): qty 1

## 2018-08-18 MED ORDER — CHLORHEXIDINE GLUCONATE CLOTH 2 % EX PADS: 6.0000 | MEDICATED_PAD | Freq: Every day | CUTANEOUS | Status: DC

## 2018-08-18 MED ORDER — POTASSIUM CHLORIDE CRYS ER 20 MEQ PO TBCR
40.0000 meq | EXTENDED_RELEASE_TABLET | ORAL | Status: AC
  Administered 2018-08-18 (×2): 40 meq via ORAL
  Filled 2018-08-18 (×2): qty 2

## 2018-08-18 NOTE — Telephone Encounter (Signed)
Left message on daughters voicemail regarding CT being done after Christmas per Dr. Rush Landmark.  Requested that patient call me back to go ahead and get this scheduled after Christmas.

## 2018-08-18 NOTE — Care Management Important Message (Signed)
Important Message  Patient Details  Name: [name] MRN: [id number] Date of Birth: Apr 18, 1951   Medicare Important Message Given:  Yes    [name] [address] 08/18/2018, 3:59 PM

## 2018-08-18 NOTE — Telephone Encounter (Signed)
-----   Message from Irving Copas., MD sent at 08/18/2018 11:45 AM EST ----- Regarding: RE: CT abdomen w/o contrast Thanks for reaching out. I wanted the CT scan to be done no earlier than 3-4 weeks since her clinic visit. It would be too soon right now. Thanks. GM ----- Message ----- From: Lowell Guitar, RMA Sent: 08/18/2018   9:20 AM EST To: Irving Copas., MD Subject: CT abdomen w/o contrast                        Dr. Rush Landmark I spoke with patients daughter this morning regarding scheduling CT scan of abdomen.  She states that her mother is still in the hospital and wants to know can she have this done while there?  Please advise. Thanks, Peter Congo

## 2018-08-18 NOTE — Progress Notes (Signed)
[facility] ROUNDING NOTE   Subjective:   Appears to be doing better this morning no complaints  Blood pressure 131/49 pulse 58 temperature 98.5 92% nasal cannula 2 L  Dialysis 08/17/2018 2 L ultrafiltration  Sodium 141 potassium 3.4 chloride 102 CO2 27 glucose 103 BUN 39 creatinine 1.77 calcium 8.3 albumin 2.4 WBC 6.8 hemoglobin 8.0 platelets 258  Objective:  Vital signs in last 24 hours:  Temp:  [97.8 F (36.6 C)-98.5 F (36.9 C)] 98.5 F (36.9 C) (12/13 0730) Pulse Rate:  [56-79] 58 (12/13 0730) Resp:  [16-19] 18 (12/13 0730) BP: (130-169)/(49-91) 131/49 (12/13 0730) SpO2:  [91 %-97 %] 92 % (12/13 0739) Weight:  [59.3 kg-61.2 kg] 59.3 kg (12/12 2031)  Weight change: 0 kg Filed Weights   08/17/18 1301 08/17/18 1616 08/17/18 2031  Weight: 61.2 kg 59.3 kg 59.3 kg    Intake/Output: I/O last 3 completed shifts: In: 55 [P.O.:780; IV Piggyback:110] Out: 2353 [Urine:351; Other:2000; Stool:2]   Intake/Output this shift:  No intake/output data recorded.   Awake alert no acute distress cachectic appearing lady CVS- RRR JVP mildly elevated. RS- CTA diminished at bases ABD- BS present soft non-distended EXT-2+ pitting edema left AV fistula with thrill and bruit   Basic Metabolic Panel: Recent Labs  Lab 08/14/18 2239 08/15/18 0843 08/16/18 0622 08/17/18 0339 08/17/18 1500 08/18/18 0447  NA 136 138 137 139  --  141  K 2.2* 2.1* 2.7* 3.6  --  3.4*  CL 100 101 102 103  --  102  CO2 17* 19* 20* 23  --  27  GLUCOSE 115* 119* 136* 126*  --  103*  BUN 121* 122* 84* 92*  --  39*  CREATININE 3.57* 3.68* 2.83* 3.02*  --  1.77*  CALCIUM 7.2* 7.1* 7.7* 8.3*  --  8.3*  MG  --  0.8* 1.4*  --  2.1  --   PHOS  --   --   --   --   --  2.7    Liver Function Tests: Recent Labs  Lab 08/14/18 1709 08/15/18 0843 08/17/18 0339 08/18/18 0447  AST 12 14* 17  --   ALT 4 7 7   --   ALKPHOS 39 35* 33*  --   BILITOT 0.9 1.1 0.8  --   PROT 6.4 5.4* 5.5*  --    ALBUMIN 3.3* 2.4* 2.5* 2.4*   Recent Labs  Lab 08/15/18 0843 08/17/18 0339  LIPASE 106* 267*   No results for input(s): AMMONIA in the last 168 hours.  CBC: Recent Labs  Lab 08/14/18 2239 08/15/18 0843 08/17/18 1327 08/18/18 0447  WBC 9.3 9.3 7.9 6.8  NEUTROABS  --  8.3*  --   --   HGB 8.7* 7.8* 8.1* 8.0*  HCT 27.4* 24.9* 26.3* 25.9*  MCV 89.3 89.2 90.4 90.9  PLT 259 258 290 258    Cardiac Enzymes: No results for input(s): CKTOTAL, CKMB, CKMBINDEX, TROPONINI in the last 168 hours.  BNP: Invalid input(s): POCBNP  CBG: Recent Labs  Lab 08/17/18 0741 08/17/18 1125 08/17/18 1656 08/17/18 2031 08/18/18 0729  GLUCAP 143* 156* 105* 142* 129*    Microbiology: Results for orders placed or performed during the hospital encounter of 07/08/18  MRSA PCR Screening     Status: None   Collection Time: 07/08/18  6:36 PM  Result Value Ref Range Status   MRSA by PCR NEGATIVE NEGATIVE Final    Comment:        The GeneXpert MRSA Assay (  FDA approved for NASAL specimens only), is one component of a comprehensive MRSA colonization surveillance program. It is not intended to diagnose MRSA infection nor to guide or monitor treatment for MRSA infections. Performed at [facility], [place] [address] [place], [place] [address]   Culture, blood (Routine X 2) w Reflex to ID Panel     Status: None   Collection Time: 07/10/18 10:36 AM  Result Value Ref Range Status   Specimen Description   Final    BLOOD RIGHT ARM Performed at [facility] [place] [address], [place], [place] [address]    Special Requests   Final    BOTTLES DRAWN AEROBIC AND ANAEROBIC Blood Culture adequate volume Performed at [place] [address], [place], [place] [address]    Culture   Final    NO GROWTH 5 DAYS Performed at [facility], [place] [address], [place], [place] [address]    Report Status 07/15/2018 FINAL  Final  Culture, blood  (Routine X 2) w Reflex to ID Panel     Status: None   Collection Time: 07/10/18 10:38 AM  Result Value Ref Range Status   Specimen Description   Final    BLOOD RIGHT HAND Performed at [facility] [address], [place], [place] [address]    Special Requests   Final    BOTTLES DRAWN AEROBIC ONLY Blood Culture adequate volume Performed at [place] [address] [place], [place] [address]    Culture   Final    NO GROWTH 5 DAYS Performed at [facility], [place] [address], [place], [place] [address]    Report Status 07/15/2018 FINAL  Final    Coagulation Studies: No results for input(s): LABPROT, INR in the last 72 hours.  Urinalysis: No results for input(s): COLORURINE, LABSPEC, PHURINE, GLUCOSEU, HGBUR, BILIRUBINUR, KETONESUR, PROTEINUR, UROBILINOGEN, NITRITE, LEUKOCYTESUR in the last 72 hours.  Invalid input(s): APPERANCEUR    Imaging: Dg Abd 1 View  Result Date: 08/18/2018 CLINICAL DATA:  Chronic kidney disease and pulmonary edema. EXAM: ABDOMEN - 1 VIEW COMPARISON:  07/08/2018 FINDINGS: Bowel gas pattern is nonobstructive. No evidence of free peritoneal air. Surgical clips over the right upper quadrant. Aortoiliac stent graft unchanged. Surgical sutures over the pelvis. Calcified plaque over the aortoiliac vessels. Remainder of the exam is unchanged. IMPRESSION: Nonobstructive bowel gas pattern. Electronically Signed   By: Marin Olp M.D.   On: 08/18/2018 10:20     Medications:   . sodium chloride    . sodium chloride    . sodium chloride    . sodium chloride    . ferric gluconate (FERRLECIT/NULECIT) IV 125 mg (08/17/18 1546)   . allopurinol  100 mg Oral Daily  . amLODipine  10 mg Oral Daily  . atorvastatin  40 mg Oral Daily  . carvedilol  12.5 mg Oral BID WC  . Chlorhexidine Gluconate Cloth  6 each Topical Q0600  . clopidogrel  75 mg Oral Q breakfast  . darbepoetin (ARANESP) injection - NON-DIALYSIS  200 mcg  Subcutaneous Q Tue-1800  . feeding supplement  1 Container Oral BID BM  . feeding supplement (PRO-STAT SUGAR FREE 64)  30 mL Oral TID WC  . fenofibrate  160 mg Oral Daily  . gabapentin  300 mg Oral QHS  . heparin  5,000 Units Subcutaneous Q8H  . hydrALAZINE  25 mg Oral Q8H  . insulin aspart  0-9 Units Subcutaneous TID WC  .  lipase/protease/amylase  24,000 Units Oral TID WC  . mometasone-formoterol  2 puff Inhalation BID  . pantoprazole  40 mg Oral Daily  . vitamin B-12  1,000 mcg Oral Daily   sodium chloride, sodium chloride, sodium chloride, sodium chloride, acetaminophen, albuterol, alteplase, calcium carbonate, heparin, HYDROmorphone (DILAUDID) injection, HYDROmorphone, lidocaine (PF), lidocaine-prilocaine, pentafluoroprop-tetrafluoroeth, polyethylene glycol  Assessment/ Plan:   Chronic kidney disease stage V.  2 L removed with dialysis 08/17/2018  Started clip process.  Plan dialysis 08/19/2018.  Hypertension/volume continue with fluid removal with dialysis  Anemia iron stores low will replete with IV iron  Bones PTH 80  Diastolic dysfunction continue with fluid removal with dialysis  Hypokalemia replete with oral potassium supplementation increase dialysate potassium  History of GIB with EGD and cautery 2011  History of esophageal reflux disease  CAD stable continues on Coreg statin and Plavix  History of recurrent pancreatitis continues on pancreatic enzyme supplementation   LOS: 2 [place] @TODAY @10 :57 AM

## 2018-08-18 NOTE — Progress Notes (Signed)
[place] VISIT   Primary Care Provider Bernerd Limbo, MD [place] [id number] [phone]  Patient Profile: [name] is a 67 y.o. female with a pmh significant for AAA (status post repair in May 2019), heart failure, CAD (on Plavix), atrial fibrillation (not on anticoagulation), chronic renal insufficiency (now on intermittent hemodialysis from recent hospitalization previously just stage 4) peripheral vascular disease (status post bypasses), carotid artery disease (status post CEA and left subclavian stent), hypertension, diabetes, hyperlipidemia, status post cholecystectomy, severe pancreatitis (unclear etiology though presumed to have been gallstone related and reason for gallbladder to be resected but complicated in the sense of peripancreatic fluid collection development).  The patient presents to the [facility] for an evaluation and management of problem(s) noted below:  Problem List 1. Recurrent pancreatitis   2. Anasarca   3. Peripancreatic fluid collection    History of Present Illness: This patient's history is long and has been documented in her multiple hospitalizations and consultation notes from GI.  She is Dr. Steve Rattler primary patient however have been asked to evaluate her in the setting of her pancreas issues that she has been dealing with most recently.  In brief, this patient developed pancreatitis in August 2019.  It was presumed that this was gallbladder in origin and subsequently she underwent a cholecystectomy.  The findings of pathology were consistent with chronic cholecystitis.  She was readmitted in September where I met her for the first time setting of a recurrent bout of pancreatitis for smoldering pancreatitis as she was still having issues after her discharge from the hospital.  She is been able to manifest lipase elevations.  That being said she has had multiple imaging studies  which have been noncontrasted due to her chronic renal insufficiency And showed development of peripancreatic fluid collections as well as at one point time concern for bleeding into this fluid collection.  Eventually required Dobbhoff feeding tube placement for a few weeks and then developed issues of GI bleeding that were result of ulcer disease likely a result of her Dobbhoff being in place.  Eventually she was discharged but has had multiple returns to the hospital.  She was evaluated by my colleague Dr. Ardis Hughs for a recurrent admission in the setting of a cystic duct stone being found on MRI imaging with concern about whether this was causing recurrent symptoms or her recurrent pancreatitis that she has once again manifested lipase elevation.  Discussed her case with [facility] who felt that going after the cystic duct stone was not currently helpful in the setting of her liver test not being abnormal felt to be the etiology of her issues.  She had a growing/developing peripancreatic fluid collection that IR felt to not be sampled.  He got her set up to see me as an outpatient to discuss whether there was a role for endoscopic therapy.  She was readmitted during Thanksgiving in the setting of recurrent pain while her pain medications were being down titrated.  She eventually was discharged on Thanksgiving day and subsequently now follows up.  Today, the patient is looking better than when she was discharged.  She still has significant weakness and fatigue.  She is also developing unfortunately significant anasarca of her lower extremities.  Her and her 2 daughters note that this has led to at least a 10 to 12 pound weight gain over the course the last week or so.  She just had her Lasix increased  by her primary care provider.  Later this week she supposed to see her nephrologist for further evaluation.  She did require intermittent dialysis during her recent hospital stay but was able to be  discharged home without need for dialysis.  She was noted to have hypokalemia on labs from a week ago and was started on potassium which she is going to pick up today.  She was increased on her Lasix dosing.  She does describe persistent abdominal discomfort however it is been well controlled on her current regimen of medications.  She is taking it every 4 hours and had stretched out every 5 hours.  By doing so she is able to eat and tolerate.  As stated before her weight has increased however how much of that is due to anasarca not completely clear.  Patient denies any jaundice.  She and her daughters are worried about the next steps.  She has no fevers or chills.  The patient has not had a repeat upper endoscopy to evaluate for ulcer healing.  GI Review of Systems Positive as above including pyrosis (on PPI therapy) Negative for dysphagia, odynophagia, melena, hematochezia, change in bowel habits  Review of Systems General: Denies fevers/chills HEENT: Denies oral lesions Cardiovascular: Denies chest pain Pulmonary: Shortness of breath is been increasing slowly Gastroenterological: See HPI Genitourinary: Denies darkened urine Hematological: Positive for easy bruising/bleeding due to Plavix Dermatological: Denies jaundice Psychological: Mood is anxious about the progressing lower extremity edema   Medications No current facility-administered medications for this visit.    No current outpatient medications on file.   Facility-Administered Medications Ordered in Other Visits  Medication Dose Route Frequency Provider Last Rate Last Dose  . 0.9 %  sodium chloride infusion  100 mL Intravenous PRN Edrick Oh, MD      . 0.9 %  sodium chloride infusion  100 mL Intravenous PRN Edrick Oh, MD      . 0.9 %  sodium chloride infusion  100 mL Intravenous PRN Edrick Oh, MD      . 0.9 %  sodium chloride infusion  100 mL Intravenous PRN Edrick Oh, MD      . acetaminophen (TYLENOL) tablet 650 mg   650 mg Oral Q6H PRN Samuella Cota, MD      . albuterol (PROVENTIL) (2.5 MG/3ML) 0.083% nebulizer solution 2.5 mg  2.5 mg Inhalation Q4H PRN Rise Patience, MD      . allopurinol (ZYLOPRIM) tablet 100 mg  100 mg Oral Daily Rise Patience, MD   100 mg at 08/17/18 0800  . alteplase (CATHFLO ACTIVASE) injection 2 mg  2 mg Intracatheter Once PRN Edrick Oh, MD      . amLODipine (NORVASC) tablet 10 mg  10 mg Oral Daily Rise Patience, MD   10 mg at 08/17/18 0801  . atorvastatin (LIPITOR) tablet 40 mg  40 mg Oral Daily Rise Patience, MD   40 mg at 08/17/18 0801  . calcium carbonate (TUMS - dosed in mg elemental calcium) chewable tablet 200 mg of elemental calcium  1 tablet Oral Q6H PRN Samuella Cota, MD   200 mg of elemental calcium at 08/17/18 0016  . carvedilol (COREG) tablet 12.5 mg  12.5 mg Oral BID WC Rise Patience, MD   12.5 mg at 08/17/18 1712  . Chlorhexidine Gluconate Cloth 2 % PADS 6 each  6 each Topical Q0600 Edrick Oh, MD      . clopidogrel (PLAVIX) tablet 75 mg  75 mg  Oral Q breakfast Rise Patience, MD   75 mg at 08/17/18 0801  . Darbepoetin Alfa (ARANESP) injection 200 mcg  200 mcg Subcutaneous Q Tue-1800 Edrick Oh, MD   200 mcg at 08/15/18 2101  . feeding supplement (BOOST / RESOURCE BREEZE) liquid 1 Container  1 Container Oral BID BM Rise Patience, MD   1 Container at 08/17/18 [phone]  . feeding supplement (PRO-STAT SUGAR FREE 64) liquid 30 mL  30 mL Oral TID WC Rise Patience, MD   30 mL at 08/17/18 1712  . fenofibrate tablet 160 mg  160 mg Oral Daily Rise Patience, MD   160 mg at 08/17/18 0801  . ferric gluconate (NULECIT) 125 mg in sodium chloride 0.9 % 100 mL IVPB  125 mg Intravenous Q T,Th,Sa-HD Edrick Oh, MD 110 mL/hr at 08/17/18 1546 125 mg at 08/17/18 1546  . gabapentin (NEURONTIN) capsule 300 mg  300 mg Oral QHS Samuella Cota, MD   300 mg at 08/17/18 2141  . heparin injection 1,000 Units  1,000 Units  Dialysis PRN Edrick Oh, MD      . heparin injection 5,000 Units  5,000 Units Subcutaneous Q8H Rise Patience, MD   5,000 Units at 08/17/18 2143  . hydrALAZINE (APRESOLINE) tablet 25 mg  25 mg Oral Q8H Rise Patience, MD   25 mg at 08/17/18 2141  . HYDROmorphone (DILAUDID) injection 0.5 mg  0.5 mg Intravenous Q4H PRN Cristal Ford, DO   0.5 mg at 08/18/18 0231  . HYDROmorphone (DILAUDID) tablet 2 mg  2 mg Oral Q4H PRN Rise Patience, MD   2 mg at 08/17/18 0936  . insulin aspart (novoLOG) injection 0-9 Units  0-9 Units Subcutaneous TID WC Samuella Cota, MD   1 Units at 08/17/18 0759  . lidocaine (PF) (XYLOCAINE) 1 % injection 5 mL  5 mL Intradermal PRN Edrick Oh, MD      . lidocaine-prilocaine (EMLA) cream 1 application  1 application Topical PRN Edrick Oh, MD      . lipase/protease/amylase (CREON) capsule 24,000 Units  24,000 Units Oral TID WC Samuella Cota, MD   24,000 Units at 08/17/18 1712  . mometasone-formoterol (DULERA) 200-5 MCG/ACT inhaler 2 puff  2 puff Inhalation BID Rise Patience, MD   2 puff at 08/17/18 2042  . pantoprazole (PROTONIX) EC tablet 40 mg  40 mg Oral Daily Rise Patience, MD   40 mg at 08/17/18 0800  . pentafluoroprop-tetrafluoroeth (GEBAUERS) aerosol 1 application  1 application Topical PRN Edrick Oh, MD      . polyethylene glycol (MIRALAX / GLYCOLAX) packet 17 g  17 g Oral Daily PRN Rise Patience, MD      . vitamin B-12 (CYANOCOBALAMIN) tablet 1,000 mcg  1,000 mcg Oral Daily Rise Patience, MD   1,000 mcg at 08/17/18 0801    Allergies Allergies  Allergen Reactions  . Isosorbide Other (See Comments)    Can only tolerate in low doses  . Isosorbide Nitrate Other (See Comments)    Can only tolerate in low doses  . Oxycodone-Acetaminophen Itching    Tolerates Hydrocodone.  Also tolerated oxycodone 07/13/18 admission.    . Codeine Itching  . Contrast Media [Iodinated Diagnostic Agents] Itching and Other  (See Comments)    Itching of feet  . Ioxaglate Itching and Other (See Comments)    Itching of feet  . Metrizamide Itching and Other (See Comments)    Itching of feet  . Percocet [  Oxycodone-Acetaminophen] Itching and Other (See Comments)    Tolerates Hydrocodone    Histories Past Medical History:  Diagnosis Date  . AAA (abdominal aortic aneurysm) ([place]) 5/09   3.7x3.3 by u/s 2009  . Abnormal EKG    deep TW inversions chronic  . Anemia   . CAD (coronary artery disease)    a. CABG 03/2011 LIMA to LAD, SVG to OM, SVG to PDA. b. cath 06/25/2015 DES to SVG to rPDA, patent LIMA to LAD, patent SVG to OM  . carotid stenosis 5/09   S/p L CEA ;  60-79% bilat ICA by preCABG dopplers 7/12  . CHF (congestive heart failure) ([place])   . Chronic back pain    "all over" (06/24/2015)  . Chronic bronchitis ([place])    "get it pretty much q yr" (06/24/2015)  . CKD (chronic kidney disease)   . Complication of anesthesia 1966   "problem w/ether"  . COPD (chronic obstructive pulmonary disease) ([place])   . Depression   . GERD (gastroesophageal reflux disease)    With hiatal hernia  . GIB (gastrointestinal bleeding) 9/11   S/p EGD with cautery at [facility]  . Heart murmur   . History of blood transfusion "a few times"   "all related to anemia" (06/24/2015)  . History of hiatal hernia   . Hyperlipidemia   . Hypertension   . Mitral regurgitation    3+ MR by intraoperative TEE;  s/p MV repair with Dr. Roxy Manns 7/12  . Myocardial infarction [facility]) 2012 "several"  . On home oxygen therapy    "2 liters at night; negative for sleep apnea"  . PAD (peripheral artery disease) ([facility])    Severe; s/p bilateral renal artery stents, moderate in-stent restenosis  . Pancreatitis 05/2018  . Paroxysmal atrial fibrillation ([facility])    coumadin;  echo 9/07: EF 60%, mild LVH;  s/p Cox Maze 7/12 with LAA clipping  . Subclavian artery stenosis, left ([facility])    stented by Dr. Trula Slade on 10/18 to help flow of her LIMA to LAD  . Type II  diabetes mellitus ([place])    Past Surgical History:  Procedure Laterality Date  . A/V FISTULAGRAM Left 03/21/2018   Procedure: A/V FISTULAGRAM;  Surgeon: Serafina Mitchell, MD;  Location: [place] [facility];  Service: Cardiovascular;  Laterality: Left;  . ABDOMINAL AORTIC ENDOVASCULAR STENT GRAFT N/A 01/17/2018   Procedure: ABDOMINAL AORTIC ENDOVASCULAR STENT GRAFT WITH CO2;  Surgeon: Serafina Mitchell, MD;  Location: [place];  Service: Vascular;  Laterality: N/A;  . ABDOMINAL HYSTERECTOMY  1990's  . APPENDECTOMY  Aug. 11, 2016   Ruptured  . AV FISTULA PLACEMENT Left 11/03/2017   Procedure: ARTERIOVENOUS (AV) FISTULA CREATION LEFT ARM;  Surgeon: Serafina Mitchell, MD;  Location: [facility];  Service: Vascular;  Laterality: Left;  . BOWEL RESECTION  2013  . CARDIAC CATHETERIZATION N/A 06/24/2015   Procedure: Left Heart Cath and Cors/Grafts Angiography;  Surgeon: Leonie Man, MD;  Location: [place] [facility];  Service: Cardiovascular;  Laterality: N/A;  . CARDIAC CATHETERIZATION N/A 06/25/2015   Procedure: Coronary Stent Intervention;  Surgeon: Sherren Mocha, MD;  Location: [place] [facility];  Service: Cardiovascular;  Laterality: N/A;  . CAROTID ENDARTERECTOMY Left   . CATARACT EXTRACTION Right 03/27/2018  . CHOLECYSTECTOMY N/A 04/19/2018   Procedure: LAPAROSCOPIC CHOLECYSTECTOMY WITH INTRAOPERATIVE CHOLANGIOGRAM;  Surgeon: Rolm Bookbinder, MD;  Location: [facility];  Service: General;  Laterality: N/A;  . CORONARY ANGIOPLASTY    . CORONARY ARTERY BYPASS GRAFT  03/05/2011  CABG X3 (LIMA to LAD, SVG to OM, SVG to PDA, EVH via left thigh  . CORONARY STENT PLACEMENT    . ESOPHAGOGASTRODUODENOSCOPY N/A 04/12/2018   Procedure: ESOPHAGOGASTRODUODENOSCOPY (EGD);  Surgeon: [name], MD;  Location: [facility] ENDOS[facility];  Service: Endoscopy;  Laterality: N/A;  . ESOPHAGOGASTRODUODENOSCOPY N/A 06/16/2018   Procedure: ESOPHAGOGASTRODUODENOSCOPY (EGD);  Surgeon: Thornton Park, MD;  Location: [facility];   Service: Gastroenterology;  Laterality: N/A;  . LACERATION REPAIR Right 1990's   WRIST  . MAZE  03/05/2011   complete biatrial lesion set with clipping of LA appendage  . MITRAL VALVE REPAIR  03/05/2011   2m Memo 3D ring annuloplasty for ischemic MR  . PERIPHERAL VASCULAR BALLOON ANGIOPLASTY Left 03/21/2018   Procedure: PERIPHERAL VASCULAR BALLOON ANGIOPLASTY;  Surgeon: BSerafina Mitchell MD;  [facility];  Service: Cardiovascular;  Laterality: Left;  . PERIPHERAL VASCULAR CATHETERIZATION N/A 06/24/2015   Procedure: Aortic Arch Angiography;  Surgeon: VSerafina Mitchell MD;  [facility];  Service: Cardiovascular;  Laterality: N/A;  . PERIPHERAL VASCULAR CATHETERIZATION  06/24/2015   Procedure: Peripheral Vascular Intervention;  Surgeon: VSerafina Mitchell MD;  [facility];  Service: Cardiovascular;;  . PERIPHERAL VASCULAR CATHETERIZATION N/A 06/24/2015   Procedure: Abdominal Aortogram;  Surgeon: VSerafina Mitchell MD;  [facility];  Service: Cardiovascular;  Laterality: N/A;  . RENAL ANGIOGRAM Bilateral 09/11/2013   Procedure: RENAL ANGIOGRAM;  Surgeon: VSerafina Mitchell MD;  [facility];  Service: Cardiovascular;  Laterality: Bilateral;  . RIGHT FEMORAL-POPLITEAL BYPASS     Social History   Socioeconomic History  . Marital status: Widowed    Spouse name: Not on file  . Number of children: Not on file  . Years of education: Not on file  . Highest education level: Not on file  Occupational History  . Occupation: CScientist, water qualityin past    Employer: DISABLED  Social Needs  . Financial resource strain: Not on file  . Food insecurity:    Worry: Not on file    Inability: Not on file  . Transportation needs:    Medical: Not on file    Non-medical: Not on file  Tobacco Use  . Smoking status: Current Every Day Smoker    Years: 50.00    Types: Cigarettes  . Smokeless tobacco: Never Used  . Tobacco comment: 3/4 pk per day    Substance and Sexual Activity  . Alcohol use: Yes    Alcohol/week: 0.0 standard drinks    Comment: occasionally  . Drug use: Yes    Types: Marijuana  . Sexual activity: Not Currently    Birth control/protection: None  Lifestyle  . Physical activity:    Days per week: Not on file    Minutes per session: Not on file  . Stress: Not on file  Relationships  . Social connections:    Talks on phone: Not on file    Gets together: Not on file    Attends religious service: Not on file    Active member of club or organization: Not on file    Attends meetings of clubs or organizations: Not on file    Relationship status: Not on file  . Intimate partner violence:    Fear of current or ex partner: Not on file    Emotionally abused: Not on file    Physically abused: Not on file    Forced sexual activity: Not on file  Other Topics Concern  .  Not on file  Social History Narrative   Widowed in 2009.   Family History  Problem Relation Age of Onset  . Emphysema Mother   . Heart disease Mother        before age 62  . Hypertension Mother   . Hyperlipidemia Mother   . Heart attack Mother   . AAA (abdominal aortic aneurysm) Mother        rupture  . Heart attack Father 10  . Emphysema Father   . Heart disease Father        before age 48  . Hyperlipidemia Father   . Hypertension Father   . Peripheral vascular disease Father   . Diabetes Brother   . Heart disease Brother        before age 75  . Hyperlipidemia Brother   . Hypertension Brother   . Heart attack Brother        CABG  . Heart disease Other        Vascular disease in grandparents, uncles and dad  . Colon cancer Neg Hx   . Esophageal cancer Neg Hx   . Inflammatory bowel disease Neg Hx   . Liver disease Neg Hx   . Pancreatic cancer Neg Hx   . Rectal cancer Neg Hx   . Stomach cancer Neg Hx    I have reviewed her medical, social, and family history in detail and updated the electronic medical record as necessary.     PHYSICAL EXAMINATION  BP 126/60   Pulse 72   Ht '5\' 6"'$  (1.676 m)   Wt 140 lb (63.5 kg)   BMI 22.60 kg/m  Wt Readings from Last 3 Encounters:  08/17/18 130 lb 11.7 oz (59.3 kg)  08/14/18 140 lb (63.5 kg)  08/01/18 138 lb 3.7 oz (62.7 kg)  GEN: No acute distress, resting in her chair with her Insurance claims handler present, 2 daughters present, chronically ill-appearing but nontoxic PSYCH: Cooperative, without pressured speech EYE: Conjunctivae pale-pink, sclerae anicteric ENT: MMM CV: RR without R/Gs  RESP: Decreased breath sounds at the bases bilaterally with concern for possible rhonchi GI: NABS, soft, tenderness to palpation noted in the midepigastrium upon deep palpation, nondistended, without rebound or guarding MSK/EXT: 2+ pitting edema from ankles to thighs SKIN: No jaundice NEURO:  Alert & Oriented x 3, no focal deficits   REVIEW OF DATA  I reviewed the following data at the time of this encounter:  GI Procedures and Studies  October 2019 EGD - Two non-bleeding esophageal ulcer with evidence for recent bleeding - likely due to Dobbhoff tube. - Clotted blood in the cardia, in the gastric fundus and in the gastric body limiting a full evaluation. - Large clot on the tip of the Dobbhoff had limited prior removal. The clot was partially removed and the Dobbhoff successfully removed. - Normal examined duodenum. - No specimens collected. - No active bleeding was noted during this procedure.  August 2019 EGD - Normal stomach. - Mucosal changes in the stomach and duodenum. - The examination was otherwise normal. - No specimens collected. No active bleeding.  Laboratory Studies  Reviewed in epic and care everywhere  Imaging Studies  November 2019 CT abdomen pelvis without contrast IMPRESSION: 1. Slightly hyperdense complex process spanning over 8 cm centered at the pancreatic head and duodenal sweep has not improved. This is causing significant narrowing and  thickening of the duodenum. It is possible this is a result of hemorrhagic pancreatitis given the hyperdense appearance. This does not have  typical appearance of a pancreatic pseudocyst. Hematoma, infection or biloma are other considerations. 2. Interval clearing of previously noted left lower lobe consolidation. Septal thickening and small pleural effusions greater on right may reflect changes of mild fluid overload given the diffuse third spacing of fluid. 3. Post cholecystectomy. 6 mm stone in gallbladder stump. 4. Atherosclerotic changes aorta. Post endoluminal stenting. Native aortic aneurysm measures up to 4.9 cm without change. 5. Complex fluid collection adjacent to the lateral border of the left psoas muscle without change. This measures up to 5 cm.  July 12, 2018 HIDA IMPRESSION: 1. No evidence of bile leak or obstruction. 2. Patent common bile duct. 3. Prominent cystic duct remnant as seen on comparison MRI.  November 2019 MRI/MRCP IMPRESSION: Prior cholecystectomy. Dilated cystic duct stump in gallbladder fossa containing a 5 mm calculus. No evidence of biliary ductal dilatation or choledocholithiasis. Mild decrease in size of heterogeneous "mass" with internal cystic foci and surrounding inflammatory changes between the descending duodenum and pancreatic head. This may represent a biloma, abscess, or evolving pancreatic pseudocyst. Small amount of fluid in gallbladder fossa and upper abdomen; postop bile leak cannot be excluded. Consider nuclear medicine hepatobiliary scan for further evaluation. Decreased bowel wall thickening involving the hepatic flexure of colon. Aortic stent graft with stable size of 4.9 cm native abdominal aortic aneurysm. Hemosiderosis.   ASSESSMENT  Ms. Villalon is a 67 y.o. female with a pmh significant for AAA (status post repair in May 2019), heart failure, CAD (on Plavix), atrial fibrillation (not on anticoagulation), chronic renal  insufficiency (now on intermittent hemodialysis from recent hospitalization previously just stage 4) peripheral vascular disease (status post bypasses), carotid artery disease (status post CEA and left subclavian stent), hypertension, diabetes, hyperlipidemia, status post cholecystectomy, severe pancreatitis (unclear etiology though presumed to have been gallstone related and reason for gallbladder to be resected but complicated in the sense of peripancreatic fluid collection development).  The patient is seen today for evaluation and management of:  1. Recurrent pancreatitis   2. Anasarca   3. Peripancreatic fluid collection    This patient's clinical history and course of the course the last few months is been undeniably difficult for her and for her family.  She is a different person than who she was before the beginning of 2019 and she has medical comorbidities that have significantly made things more difficult for her.  After weeks or months from her endovascular repair of her AAA she subsequently had pancreatitis and has not really done well without being in or out of the hospital nearly every month since the fall.  The etiology of her pancreatitis again was presumed to be gallstone related with her gallbladder subsequently having been removed however the timing and the normal liver tests and normal CBD make me concerned as of discussed in prior consultation notes by whether this was a true case of cause.  With that being said she is on multiple medications including her Lasix which she has been on for years which are class I agent that can cause pancreatitis.  No other medications have been initiated around that time.  When she had her bout of pancreatitis.  Now her recurrent episodes of pancreatitis may be just a smoldering etiology as a result of kind of issues of her peripancreatic fluid collections.  Thankfully since her discharge from the hospital she feels like she is able to eat what she wants  although she is not following a low-fat diet.  Weight has increased though again  the issues of anasarca which are not related to her pancreatitis are probably causing this.  We had a very lengthy discussion about the imaging studies she has had as well as the need for a low-fat diet and her cessation of tobacco to be absolutely performed to try and help improve her chances of healing as much as possible.  I could be wrong but hopefully based on her and her primary care doctor giving her pain medication therapy at this point in time that she seems to be stable.  I am more concerned about her kidney dysfunction and anasarca today.  I will obtain some basic lab work today.  I would like to repeat a CT scan in a few weeks time as a noncontrasted CT and then consider the role of an endoscopic ultrasound.  If she were to require dialysis or become dialysis dependent then I would rather have a CT abdomen with IV contrast.  The patient and daughters understand my concerns overall and the risks of going after this peripancreatic fluid collection.  She is not actively infected, she seems to be tolerating a diet, and although she has pain and discomfort it seems to be controlled currently.  Thus major indication for attempt at decompression of the fluid collection should be on hold for now but we will need to consider this in case we are well.  All patient questions were answered, to the best of my ability, and the patient agrees to the aforementioned plan of action with follow-up as indicated.   PLAN  Obtain lab work as outlined below Plan for follow-up in clinic and a CT abdomen without contrast to evaluate for progression of her peripancreatic fluid collections Follow-up with nephrology as already scheduled for evaluation and management of anasarca Patient's primary gastroenterologist is Dr. [name]  Orders Placed This Encounter  Procedures  . Basic Metabolic Panel (BMET)  . Hepatic function panel  . CRP High  sensitivity    New Prescriptions   No medications on file   Modified Medications   No medications on file    Planned Follow Up: No follow-ups on file.   Justice Britain, MD [facility] Office # [phone]

## 2018-08-18 NOTE — Progress Notes (Signed)
PROGRESS NOTE    [name]  EGB:[id number] DOB: 07/02/1951 DOA: 08/14/2018 PCP: Bernerd Limbo, MD   Brief Narrative:  HPI on 08/15/2018 by Dr. Garret Reddish MONTSERRATH [name] is a 67 y.o. female with history of CAD, abdominal aortic aneurysm status post repair, recently admitted for pancreatitis following which patient was again admitted with renal failure requiring dialysis and eventually was discharged home after doing well and was off dialysis and followed up with her primary care physician yesterday and was found to have increasing peripheral edema for which patient was given Lasix 120 mg.  Labs revealed low potassium and was advised to come to the ER.  Patient otherwise denies any chest pain shortness of breath nausea vomiting or diarrhea.  Interim history Patient admitted for acute on chronic kidney disease with pulmonary edema and volume overload as well as hypokalemia.  Nephrology consulted and appreciated, HD and CLIP process started.  Assessment & Plan   Acute on chronic kidney disease, stage V with pulmonary edema and volume overload -Nephrology consulted and appreciated -Hemodialysis started on 08/15/2018; HD on 12/12 -Clip process started and pending   Severe hypokalemia, hypomagnesemia -Continue to replace potassium and monitor BMP -Magnesium 2.1  Recent acute pancreatitis/Abdominal pain  -Patient with back and flank pain which states she has been ongoing for several weeks -Lipase 267 -LFTs WNL -Continue Creon -this morning she feels the pain is different and in a different location -ordered KUB, reviewed and shows nonobstructive gas pattern -Abd [place] ordered and pending -given pain, will add on IV dilaudid for breakthrough pain  Anemia of chronic kidney disease -hemoglobin currently 8, appears to be close to baseline between 8 and 9 -Continue Aranesp, ferric gluconate -Continue to monitor CBC  Coronary artery disease -Currently chest pain-free -Continue Plavix,  statin, Coreg  Diabetes mellitus, type II -not on any home medications -appears to be diet controlled -CBGs have been controlled -A1c 5.3 on 05/29/2018  COPD with chronic hypoxic respiratory failure -Patient uses 2 L of home oxygen at night -Currently stable  Essential hypertension -BP currently stable.  Continue  Coreg, amlodipine, hydralazine  Chronic diastolic dysfunction -Echocardiogram 05/30/2018 shows an EF of 60 to 37%, grade 2 diastolic dysfunction -Continue volume control with hemodialysis  Aortic atherosclerosis  DVT Prophylaxis  Heparin  Code Status: Full  Family Communication: None at bedside  Disposition Plan: Admitted. Suspect home pending HD and CLIP process and improvement in abdominal pain  Consultants Nephrology   Procedures  HD  Antibiotics   Anti-infectives (From admission, onward)   None      Subjective:   [name] seen and examined today.  Continues to complain of pain, now states this is a different type of abdominal pain in a different location, more mid abdomen.  Feels it is sharp in nature.  Does not feel that oral pain medications are helping her.  Denies current nausea or vomiting, although states she had nausea overnight.  Was able to have a bowel movement yesterday.  Denies current chest pain, shortness of breath, constipation or diarrhea, dizziness or headache.  Objective:   Vitals:   08/17/18 2042 08/18/18 0452 08/18/18 0730 08/18/18 0739  BP:  (!) 142/52 (!) 131/49   Pulse:  (!) 56 (!) 58   Resp:  17 18   Temp:  98.4 F (36.9 C) 98.5 F (36.9 C)   TempSrc:  Oral Oral   SpO2: 94% 97% 91% 92%  Weight:      Height:  Intake/Output Summary (Last 24 hours) at 08/18/2018 1047 Last data filed at 08/18/2018 0600 Gross per 24 hour  Intake 770 ml  Output 2303 ml  Net -1533 ml   Filed Weights   08/17/18 1301 08/17/18 1616 08/17/18 2031  Weight: 61.2 kg 59.3 kg 59.3 kg   Exam  General: Well developed, well nourished,  NAD, appears stated age  HEENT: NCAT, Pmucous membranes moist.   Neck: Supple  Cardiovascular: S1 S2 auscultated, soft SEM, RRR  Respiratory: Clear to auscultation bilaterally with equal chest rise  Abdomen: Soft, generalized TTP (moreso- mid abd), nondistended, + bowel sounds  Extremities: warm dry without cyanosis clubbing. LE edema L>R  Neuro: AAOx3, nonfocal  Psych: Normal affect and demeanor with intact judgement and insight  Data Reviewed: I have personally reviewed following labs and imaging studies  CBC: Recent Labs  Lab 08/14/18 2239 08/15/18 0843 08/17/18 1327 08/18/18 0447  WBC 9.3 9.3 7.9 6.8  NEUTROABS  --  8.3*  --   --   HGB 8.7* 7.8* 8.1* 8.0*  HCT 27.4* 24.9* 26.3* 25.9*  MCV 89.3 89.2 90.4 90.9  PLT 259 258 290 161   Basic Metabolic Panel: Recent Labs  Lab 08/14/18 2239 08/15/18 0843 08/16/18 0622 08/17/18 0339 08/17/18 1500 08/18/18 0447  NA 136 138 137 139  --  141  K 2.2* 2.1* 2.7* 3.6  --  3.4*  CL 100 101 102 103  --  102  CO2 17* 19* 20* 23  --  27  GLUCOSE 115* 119* 136* 126*  --  103*  BUN 121* 122* 84* 92*  --  39*  CREATININE 3.57* 3.68* 2.83* 3.02*  --  1.77*  CALCIUM 7.2* 7.1* 7.7* 8.3*  --  8.3*  MG  --  0.8* 1.4*  --  2.1  --   PHOS  --   --   --   --   --  2.7   GFR: Estimated Creatinine Clearance: 29.7 mL/min (A) (by C-G formula based on SCr of 1.77 mg/dL (H)). Liver Function Tests: Recent Labs  Lab 08/14/18 1709 08/15/18 0843 08/17/18 0339 08/18/18 0447  AST 12 14* 17  --   ALT 4 7 7   --   ALKPHOS 39 35* 33*  --   BILITOT 0.9 1.1 0.8  --   PROT 6.4 5.4* 5.5*  --   ALBUMIN 3.3* 2.4* 2.5* 2.4*   Recent Labs  Lab 08/15/18 0843 08/17/18 0339  LIPASE 106* 267*   No results for input(s): AMMONIA in the last 168 hours. Coagulation Profile: No results for input(s): INR, PROTIME in the last 168 hours. Cardiac Enzymes: No results for input(s): CKTOTAL, CKMB, CKMBINDEX, TROPONINI in the last 168 hours. BNP (last  3 results) No results for input(s): PROBNP in the last 8760 hours. HbA1C: No results for input(s): HGBA1C in the last 72 hours. CBG: Recent Labs  Lab 08/17/18 0741 08/17/18 1125 08/17/18 1656 08/17/18 2031 08/18/18 0729  GLUCAP 143* 156* 105* 142* 129*   Lipid Profile: No results for input(s): CHOL, HDL, LDLCALC, TRIG, CHOLHDL, LDLDIRECT in the last 72 hours. Thyroid Function Tests: No results for input(s): TSH, T4TOTAL, FREET4, T3FREE, THYROIDAB in the last 72 hours. Anemia Panel: Recent Labs    08/15/18 1435  TIBC 295  IRON 19*   Urine analysis:    Component Value Date/Time   COLORURINE YELLOW 08/15/2018 [place] 08/15/2018 0656   LABSPEC 1.006 08/15/2018 0656   PHURINE 5.0 08/15/2018 0656   GLUCOSEU  NEGATIVE 08/15/2018 0656   HGBUR NEGATIVE 08/15/2018 [place] 08/15/2018 [place] 08/15/2018 [place] 08/15/2018 0656   UROBILINOGEN 0.2 03/04/2011 2107   NITRITE NEGATIVE 08/15/2018 0656   LEUKOCYTESUR NEGATIVE 08/15/2018 0656   Sepsis Labs: @LABRCNTIP (procalcitonin:4,lacticidven:4)  )No results found for this or any previous visit (from the past 240 hour(s)).    Radiology Studies: Dg Abd 1 View  Result Date: 08/18/2018 CLINICAL DATA:  Chronic kidney disease and pulmonary edema. EXAM: ABDOMEN - 1 VIEW COMPARISON:  07/08/2018 FINDINGS: Bowel gas pattern is nonobstructive. No evidence of free peritoneal air. Surgical clips over the right upper quadrant. Aortoiliac stent graft unchanged. Surgical sutures over the pelvis. Calcified plaque over the aortoiliac vessels. Remainder of the exam is unchanged. IMPRESSION: Nonobstructive bowel gas pattern. Electronically Signed   By: Marin Olp M.D.   On: 08/18/2018 10:20     Scheduled Meds: . allopurinol  100 mg Oral Daily  . amLODipine  10 mg Oral Daily  . atorvastatin  40 mg Oral Daily  . carvedilol  12.5 mg Oral BID WC  . Chlorhexidine Gluconate  Cloth  6 each Topical Q0600  . clopidogrel  75 mg Oral Q breakfast  . darbepoetin (ARANESP) injection - NON-DIALYSIS  200 mcg Subcutaneous Q Tue-1800  . feeding supplement  1 Container Oral BID BM  . feeding supplement (PRO-STAT SUGAR FREE 64)  30 mL Oral TID WC  . fenofibrate  160 mg Oral Daily  . gabapentin  300 mg Oral QHS  . heparin  5,000 Units Subcutaneous Q8H  . hydrALAZINE  25 mg Oral Q8H  . insulin aspart  0-9 Units Subcutaneous TID WC  . lipase/protease/amylase  24,000 Units Oral TID WC  . mometasone-formoterol  2 puff Inhalation BID  . pantoprazole  40 mg Oral Daily  . vitamin B-12  1,000 mcg Oral Daily   Continuous Infusions: . sodium chloride    . sodium chloride    . sodium chloride    . sodium chloride    . ferric gluconate (FERRLECIT/NULECIT) IV 125 mg (08/17/18 1546)     LOS: 2 days   Time Spent in minutes   30 minutes  Demetress Tift D.O. on 08/18/2018 at 10:47 AM  Between 7am to 7pm - Please see pager noted on [email or web address]  After 7pm go to [email or web address]  And look for the night coverage person covering for me after hours  [facility] Office  [phone]

## 2018-08-19 ENCOUNTER — Encounter (HOSPITAL_COMMUNITY): Payer: Self-pay | Admitting: Radiology

## 2018-08-19 LAB — COMPREHENSIVE METABOLIC PANEL
ALT: 7 U/L (ref 0–44)
AST: 20 U/L (ref 15–41)
Albumin: 2.5 g/dL — ABNORMAL LOW (ref 3.5–5.0)
Alkaline Phosphatase: 32 U/L — ABNORMAL LOW (ref 38–126)
Anion gap: 10 (ref 5–15)
BUN: 48 mg/dL — AB (ref 8–23)
CO2: 27 mmol/L (ref 22–32)
Calcium: 8.5 mg/dL — ABNORMAL LOW (ref 8.9–10.3)
Chloride: 104 mmol/L (ref 98–111)
Creatinine, Ser: 2.22 mg/dL — ABNORMAL HIGH (ref 0.44–1.00)
GFR calc Af Amer: 26 mL/min — ABNORMAL LOW (ref >60)
GFR calc non Af Amer: 23 mL/min — ABNORMAL LOW (ref >60)
Glucose, Bld: 103 mg/dL — ABNORMAL HIGH (ref 70–99)
Potassium: 4.8 mmol/L (ref 3.5–5.1)
Sodium: 141 mmol/L (ref 135–145)
Total Bilirubin: 0.6 mg/dL (ref 0.3–1.2)
Total Protein: 5.4 g/dL — ABNORMAL LOW (ref 6.5–8.1)

## 2018-08-19 LAB — GLUCOSE, CAPILLARY
Glucose-Capillary: 110 mg/dL — ABNORMAL HIGH (ref 70–99)
Glucose-Capillary: 126 mg/dL — ABNORMAL HIGH (ref 70–99)
Glucose-Capillary: 100 mg/dL — ABNORMAL HIGH (ref 70–99)
Glucose-Capillary: 184 mg/dL — ABNORMAL HIGH (ref 70–99)

## 2018-08-19 LAB — HEMOGLOBIN AND HEMATOCRIT, BLOOD
HEMATOCRIT: 28.3 % — AB (ref 36.0–46.0)
Hemoglobin: 8.3 g/dL — ABNORMAL LOW (ref 12.0–15.0)

## 2018-08-19 LAB — LIPASE, BLOOD: LIPASE: 198 U/L — AB (ref 11–51)

## 2018-08-19 MED ORDER — SODIUM CHLORIDE 0.9 % IV SOLN: 100.0000 mL | INTRAVENOUS | Status: DC | PRN

## 2018-08-19 MED ORDER — HEPARIN SODIUM (PORCINE) 1000 UNIT/ML DIALYSIS: 1000.0000 [IU] | INTRAMUSCULAR | Status: DC | PRN

## 2018-08-19 MED ORDER — ALTEPLASE 2 MG IJ SOLR: 2.0000 mg | Freq: Once | INTRAMUSCULAR | Status: DC | PRN

## 2018-08-19 MED ORDER — PENTAFLUOROPROP-TETRAFLUOROETH EX AERO: 1.0000 "application " | INHALATION_SPRAY | CUTANEOUS | Status: DC | PRN

## 2018-08-19 MED ORDER — LIDOCAINE HCL (PF) 1 % IJ SOLN: 5.0000 mL | INTRAMUSCULAR | Status: DC | PRN

## 2018-08-19 MED ORDER — LIDOCAINE-PRILOCAINE 2.5-2.5 % EX CREA: 1.0000 "application " | TOPICAL_CREAM | CUTANEOUS | Status: DC | PRN

## 2018-08-19 NOTE — Progress Notes (Signed)
PROGRESS NOTE    [name]  BSJ:[id number] DOB: June 30, 1951 DOA: 08/14/2018 PCP: Bernerd Limbo, MD   Brief Narrative:  HPI on 08/15/2018 by Dr. Garret Reddish DIALA WAXMAN is a 67 y.o. female with history of CAD, abdominal aortic aneurysm status post repair, recently admitted for pancreatitis following which patient was again admitted with renal failure requiring dialysis and eventually was discharged home after doing well and was off dialysis and followed up with her primary care physician yesterday and was found to have increasing peripheral edema for which patient was given Lasix 120 mg.  Labs revealed low potassium and was advised to come to the ER.  Patient otherwise denies any chest pain shortness of breath nausea vomiting or diarrhea.  Interim history Patient admitted for acute on chronic kidney disease with pulmonary edema and volume overload as well as hypokalemia.  Nephrology consulted and appreciated, HD and CLIP process started.  Assessment & Plan   Acute on chronic kidney disease, stage V with pulmonary edema and volume overload -Nephrology consulted and appreciated -Hemodialysis started on 08/15/2018; HD on 12/12 -Clip process started and pending   Severe hypokalemia, hypomagnesemia -Continue to replace potassium and monitor BMP -Magnesium 2.1  Recent acute pancreatitis/Abdominal pain  -Patient with back and flank pain which states she has been ongoing for several weeks -Lipase 198 -LFTs WNL -Continue Creon -this morning she feels the pain is different and in a different location -ordered KUB, reviewed and shows nonobstructive gas pattern -Abd [place] ordered: Hypoechoic masslike region measuring 4.8 x 3.8 x 5 cm inferior to the pancreatic head.  This could represent hematoma or biloma.  Small gallstone in the gallbladder stump, status post cholecystectomy.  Common bile duct dilatation. -Discussed ultrasound findings with patient, she states that she has been following up  with gastroenterology as an outpatient. -Reviewed CT scan of the abdomen pelvis from 08/01/2018, slightly hyperdense complex process is bending over 8 cm in of the pancreatic head.  6 mm stone in the gallbladder stump -Patient feels her pain is now well controlled with both oral and IV pain medications  Anemia of chronic kidney disease -hemoglobin currently 8.3, appears to be close to baseline between 8 and 9 -Continue Aranesp, ferric gluconate -Continue to monitor CBC  Coronary artery disease -Currently chest pain-free -Continue Plavix, statin, Coreg  Diabetes mellitus, type II -not on any home medications -appears to be diet controlled -CBGs have been controlled -A1c 5.3 on 05/29/2018  COPD with chronic hypoxic respiratory failure -Patient uses 2 L of home oxygen at night -Currently stable  Essential hypertension -BP currently stable.  Continue  Coreg, amlodipine, hydralazine  Chronic diastolic dysfunction -Echocardiogram 05/30/2018 shows an EF of 60 to 76%, grade 2 diastolic dysfunction -Continue volume control with hemodialysis  Aortic atherosclerosis  DVT Prophylaxis  Heparin  Code Status: Full  Family Communication: None at bedside  Disposition Plan: Admitted. Suspect home pending HD and CLIP process and improvement in abdominal pain  Consultants Nephrology   Procedures  HD Renal US  Antibiotics   Anti-infectives (From admission, onward)   None      Subjective:   [name] seen and examined today.  Feels her abdominal pain has improved this morning.  Denies current chest pain, shortness of breath, nausea or vomiting, diarrhea or constipation, dizziness or headache.    Objective:   Vitals:   08/18/18 2124 08/19/18 0442 08/19/18 0806 08/19/18 0945  BP: (!) 157/48 (!) 135/51  (!) 144/60  Pulse: 61 (!) 58  60  Resp: 15 18  18   Temp: 98.2 F (36.8 C) 98.2 F (36.8 C)  98.1 F (36.7 C)  TempSrc: Oral Oral  Oral  SpO2: 99% 91% 98% 94%  Weight: 61.6  kg     Height:        Intake/Output Summary (Last 24 hours) at 08/19/2018 1133 Last data filed at 08/19/2018 0920 Gross per 24 hour  Intake 640 ml  Output 100 ml  Net 540 ml   Filed Weights   08/17/18 1616 08/17/18 2031 08/18/18 2124  Weight: 59.3 kg 59.3 kg 61.6 kg   Exam  General: Well developed, well nourished, NAD, appears stated age  11: NCAT,mucous membranes moist.   Neck: Supple  Cardiovascular: S1 S2 auscultated, soft SEM, RRR  Respiratory: Clear to auscultation bilaterally with equal chest rise  Abdomen: Soft, mildly TTP, nondistended, + bowel sounds  Extremities: warm dry without cyanosis clubbing. ++LE edema   Neuro: AAOx3, nonfocal  Psych: Normal affect and demeanor with intact judgement and insight   Data Reviewed: I have personally reviewed following labs and imaging studies  CBC: Recent Labs  Lab 08/14/18 2239 08/15/18 0843 08/17/18 1327 08/18/18 0447 08/19/18 0447  WBC 9.3 9.3 7.9 6.8  --   NEUTROABS  --  8.3*  --   --   --   HGB 8.7* 7.8* 8.1* 8.0* 8.3*  HCT 27.4* 24.9* 26.3* 25.9* 28.3*  MCV 89.3 89.2 90.4 90.9  --   PLT 259 258 290 258  --    Basic Metabolic Panel: Recent Labs  Lab 08/15/18 0843 08/16/18 0622 08/17/18 0339 08/17/18 1500 08/18/18 0447 08/19/18 0447  NA 138 137 139  --  141 141  K 2.1* 2.7* 3.6  --  3.4* 4.8  CL 101 102 103  --  102 104  CO2 19* 20* 23  --  27 27  GLUCOSE 119* 136* 126*  --  103* 103*  BUN 122* 84* 92*  --  39* 48*  CREATININE 3.68* 2.83* 3.02*  --  1.77* 2.22*  CALCIUM 7.1* 7.7* 8.3*  --  8.3* 8.5*  MG 0.8* 1.4*  --  2.1  --   --   PHOS  --   --   --   --  2.7  --    GFR: Estimated Creatinine Clearance: 23.7 mL/min (A) (by C-G formula based on SCr of 2.22 mg/dL (H)). Liver Function Tests: Recent Labs  Lab 08/14/18 1709 08/15/18 0843 08/17/18 0339 08/18/18 0447 08/19/18 0447  AST 12 14* 17  --  20  ALT 4 7 7   --  7  ALKPHOS 39 35* 33*  --  32*  BILITOT 0.9 1.1 0.8  --  0.6    PROT 6.4 5.4* 5.5*  --  5.4*  ALBUMIN 3.3* 2.4* 2.5* 2.4* 2.5*   Recent Labs  Lab 08/15/18 0843 08/17/18 0339 08/19/18 0447  LIPASE 106* 267* 198*   No results for input(s): AMMONIA in the last 168 hours. Coagulation Profile: No results for input(s): INR, PROTIME in the last 168 hours. Cardiac Enzymes: No results for input(s): CKTOTAL, CKMB, CKMBINDEX, TROPONINI in the last 168 hours. BNP (last 3 results) No results for input(s): PROBNP in the last 8760 hours. HbA1C: No results for input(s): HGBA1C in the last 72 hours. CBG: Recent Labs  Lab 08/18/18 1113 08/18/18 1633 08/18/18 2125 08/19/18 0738 08/19/18 1125  GLUCAP 169* 116* 131* 110* 126*   Lipid Profile: No results for input(s): CHOL, HDL, LDLCALC, TRIG, CHOLHDL,  LDLDIRECT in the last 72 hours. Thyroid Function Tests: No results for input(s): TSH, T4TOTAL, FREET4, T3FREE, THYROIDAB in the last 72 hours. Anemia Panel: No results for input(s): VITAMINB12, FOLATE, FERRITIN, TIBC, IRON, RETICCTPCT in the last 72 hours. Urine analysis:    Component Value Date/Time   COLORURINE YELLOW 08/15/2018 [place] 08/15/2018 0656   LABSPEC 1.006 08/15/2018 0656   PHURINE 5.0 08/15/2018 0656   GLUCOSEU NEGATIVE 08/15/2018 0656   HGBUR NEGATIVE 08/15/2018 0656   BILIRUBINUR NEGATIVE 08/15/2018 0656   [place] 08/15/2018 0656   PROTEINUR NEGATIVE 08/15/2018 0656   UROBILINOGEN 0.2 03/04/2011 2107   NITRITE NEGATIVE 08/15/2018 0656   LEUKOCYTESUR NEGATIVE 08/15/2018 0656   Sepsis Labs: @LABRCNTIP (procalcitonin:4,lacticidven:4)  )No results found for this or any previous visit (from the past 240 hour(s)).    Radiology Studies: Dg Abd 1 View  Result Date: 08/18/2018 CLINICAL DATA:  Chronic kidney disease and pulmonary edema. EXAM: ABDOMEN - 1 VIEW COMPARISON:  07/08/2018 FINDINGS: Bowel gas pattern is nonobstructive. No evidence of free peritoneal air. Surgical clips over the right upper  quadrant. Aortoiliac stent graft unchanged. Surgical sutures over the pelvis. Calcified plaque over the aortoiliac vessels. Remainder of the exam is unchanged. IMPRESSION: Nonobstructive bowel gas pattern. Electronically Signed   By: Marin Olp M.D.   On: 08/18/2018 10:20   US Abdomen Complete  Result Date: 08/18/2018 CLINICAL DATA:  Abdominal pain for a month. Cholecystectomy. Pancreatitis. EXAM: ABDOMEN ULTRASOUND COMPLETE COMPARISON:  CT scans July 10, 2018 and August 01, 2018 FINDINGS: Gallbladder: Patient is status post cholecystectomy. A stone is again identified in the gallbladder stump measuring 9 mm today versus 6 mm on the recent CT scan. The difference in size is probably technical. Common bile duct: Diameter: 8 mm Liver: No focal mass. The liver is enlarged to 20 cm. Portal vein is patent on color Doppler imaging with normal direction of blood flow towards the liver. IVC: No abnormality visualized. Pancreas: A complex hypoechoic region is seen inferior to the pancreatic head measuring 4.8 x 3.8 x 5.0 cm. Spleen: Size and appearance within normal limits. Right Kidney: Length: 10.7 cm.  Increased cortical echogenicity. Left Kidney: Length: 10.2 cm.  Increased cortical echogenicity. Abdominal aorta: No aneurysm visualized. Other findings: None. IMPRESSION: 1. There is a hypoechoic masslike region measuring 4.8 x 3.8 x 5.0 cm inferior to the pancreatic head. Given previous CT imaging, this could represent hematoma or biloma. Infection is considered less likely as is a neoplasm. Recommend attention on follow-up. 2. Small stone in the gallbladder stump. The patient is status post cholecystectomy. 3. Mild ascites. 4. Common bile duct dilatation may simply be due to previous cholecystectomy. Recommend correlation with labs. 5. Hepatomegaly. 6. Increased echogenicity in the renal cortices consistent with medical renal disease. Electronically Signed   By: Dorise Bullion III M.D   On: 08/18/2018 16:02      Scheduled Meds: . allopurinol  100 mg Oral Daily  . amLODipine  10 mg Oral Daily  . atorvastatin  40 mg Oral Daily  . carvedilol  12.5 mg Oral BID WC  . Chlorhexidine Gluconate Cloth  6 each Topical Q0600  . clopidogrel  75 mg Oral Q breakfast  . darbepoetin (ARANESP) injection - NON-DIALYSIS  200 mcg Subcutaneous Q Tue-1800  . feeding supplement  1 Container Oral BID BM  . feeding supplement (PRO-STAT SUGAR FREE 64)  30 mL Oral TID WC  . fenofibrate  160 mg Oral Daily  . gabapentin  300 mg Oral QHS  . heparin  5,000 Units Subcutaneous Q8H  . hydrALAZINE  25 mg Oral Q8H  . insulin aspart  0-9 Units Subcutaneous TID WC  . lipase/protease/amylase  24,000 Units Oral TID WC  . mometasone-formoterol  2 puff Inhalation BID  . pantoprazole  40 mg Oral Daily  . vitamin B-12  1,000 mcg Oral Daily   Continuous Infusions: . sodium chloride    . sodium chloride    . sodium chloride    . sodium chloride    . ferric gluconate (FERRLECIT/NULECIT) IV 125 mg (08/17/18 1546)     LOS: 3 days   Time Spent in minutes   30 minutes  Avneet Ashmore D.O. on 08/19/2018 at 11:33 AM  Between 7am to 7pm - Please see pager noted on [email or web address]  After 7pm go to [email or web address]  And look for the night coverage person covering for me after hours  [facility] Office  [phone]

## 2018-08-19 NOTE — Progress Notes (Signed)
[facility] ROUNDING NOTE   Subjective:   No complaints this morning appears to be doing well.  Patient scheduled for dialysis today  Blood pressure 144/60 pulse 60 temperature 98.1 O2 sats 94% room air  Dialysis 08/17/2018 2 L ultrafiltration  Labs pending today dialysis planned    Objective:  Vital signs in last 24 hours:  Temp:  [98.1 F (36.7 C)-98.2 F (36.8 C)] 98.1 F (36.7 C) (12/14 0945) Pulse Rate:  [58-61] 60 (12/14 0945) Resp:  [15-18] 18 (12/14 0945) BP: (135-157)/(48-60) 144/60 (12/14 0945) SpO2:  [91 %-99 %] 94 % (12/14 0945) Weight:  [61.6 kg] 61.6 kg (12/13 2124)  Weight change: 0 kg Filed Weights   08/17/18 1616 08/17/18 2031 08/18/18 2124  Weight: 59.3 kg 59.3 kg 61.6 kg    Intake/Output: I/O last 3 completed shifts: In: 63 [P.O.:860; IV Piggyback:110] Out: 251 [Urine:250; Stool:1]   Intake/Output this shift:  Total I/O In: 120 [P.O.:120] Out: 100 [Urine:100]   Awake alert no acute distress cachectic appearing lady CVS- RRR JVP mildly elevated. RS- CTA diminished at bases ABD- BS present soft non-distended EXT-2+ pitting edema left AV fistula with thrill and bruit   Basic Metabolic Panel: Recent Labs  Lab 08/15/18 0843 08/16/18 0622 08/17/18 0339 08/17/18 1500 08/18/18 0447 08/19/18 0447  NA 138 137 139  --  141 141  K 2.1* 2.7* 3.6  --  3.4* 4.8  CL 101 102 103  --  102 104  CO2 19* 20* 23  --  27 27  GLUCOSE 119* 136* 126*  --  103* 103*  BUN 122* 84* 92*  --  39* 48*  CREATININE 3.68* 2.83* 3.02*  --  1.77* 2.22*  CALCIUM 7.1* 7.7* 8.3*  --  8.3* 8.5*  MG 0.8* 1.4*  --  2.1  --   --   PHOS  --   --   --   --  2.7  --     Liver Function Tests: Recent Labs  Lab 08/14/18 1709 08/15/18 0843 08/17/18 0339 08/18/18 0447 08/19/18 0447  AST 12 14* 17  --  20  ALT 4 7 7   --  7  ALKPHOS 39 35* 33*  --  32*  BILITOT 0.9 1.1 0.8  --  0.6  PROT 6.4 5.4* 5.5*  --  5.4*  ALBUMIN 3.3* 2.4* 2.5* 2.4* 2.5*    Recent Labs  Lab 08/15/18 0843 08/17/18 0339 08/19/18 0447  LIPASE 106* 267* 198*   No results for input(s): AMMONIA in the last 168 hours.  CBC: Recent Labs  Lab 08/14/18 2239 08/15/18 0843 08/17/18 1327 08/18/18 0447 08/19/18 0447  WBC 9.3 9.3 7.9 6.8  --   NEUTROABS  --  8.3*  --   --   --   HGB 8.7* 7.8* 8.1* 8.0* 8.3*  HCT 27.4* 24.9* 26.3* 25.9* 28.3*  MCV 89.3 89.2 90.4 90.9  --   PLT 259 258 290 258  --     Cardiac Enzymes: No results for input(s): CKTOTAL, CKMB, CKMBINDEX, TROPONINI in the last 168 hours.  BNP: Invalid input(s): POCBNP  CBG: Recent Labs  Lab 08/18/18 1113 08/18/18 1633 08/18/18 2125 08/19/18 0738 08/19/18 1125  GLUCAP 169* 116* 131* 110* 126*    Microbiology: Results for orders placed or performed during the hospital encounter of 07/08/18  MRSA PCR Screening     Status: None   Collection Time: 07/08/18  6:36 PM  Result Value Ref Range Status   MRSA by PCR  NEGATIVE NEGATIVE Final    Comment:        The GeneXpert MRSA Assay (FDA approved for NASAL specimens only), is one component of a comprehensive MRSA colonization surveillance program. It is not intended to diagnose MRSA infection nor to guide or monitor treatment for MRSA infections. Performed at [facility], [place] [address], [place], [place] [address]   Culture, blood (Routine X 2) w Reflex to ID Panel     Status: None   Collection Time: 07/10/18 10:36 AM  Result Value Ref Range Status   Specimen Description   Final    BLOOD RIGHT ARM Performed at [facility] [address], [place], [place] [address]    Special Requests   Final    BOTTLES DRAWN AEROBIC AND ANAEROBIC Blood Culture adequate volume Performed at [facility] [address], [place], [place] [address]    Culture   Final    NO GROWTH 5 DAYS Performed at [facility], [place] [address], [place], [place] [address]    Report Status  07/15/2018 FINAL  Final  Culture, blood (Routine X 2) w Reflex to ID Panel     Status: None   Collection Time: 07/10/18 10:38 AM  Result Value Ref Range Status   Specimen Description   Final    BLOOD RIGHT HAND Performed at [facility] [address] [place], [place] [address]    Special Requests   Final    BOTTLES DRAWN AEROBIC ONLY Blood Culture adequate volume Performed at [facility] [address] [place], [place] [address]    Culture   Final    NO GROWTH 5 DAYS Performed at [facility], [place] [address] [place], [place] [address]    Report Status 07/15/2018 FINAL  Final    Coagulation Studies: No results for input(s): LABPROT, INR in the last 72 hours.  Urinalysis: No results for input(s): COLORURINE, LABSPEC, PHURINE, GLUCOSEU, HGBUR, BILIRUBINUR, KETONESUR, PROTEINUR, UROBILINOGEN, NITRITE, LEUKOCYTESUR in the last 72 hours.  Invalid input(s): APPERANCEUR    Imaging: Dg Abd 1 View  Result Date: 08/18/2018 CLINICAL DATA:  Chronic kidney disease and pulmonary edema. EXAM: ABDOMEN - 1 VIEW COMPARISON:  07/08/2018 FINDINGS: Bowel gas pattern is nonobstructive. No evidence of free peritoneal air. Surgical clips over the right upper quadrant. Aortoiliac stent graft unchanged. Surgical sutures over the pelvis. Calcified plaque over the aortoiliac vessels. Remainder of the exam is unchanged. IMPRESSION: Nonobstructive bowel gas pattern. Electronically Signed   By: Marin Olp M.D.   On: 08/18/2018 10:20   US Abdomen Complete  Result Date: 08/18/2018 CLINICAL DATA:  Abdominal pain for a month. Cholecystectomy. Pancreatitis. EXAM: ABDOMEN ULTRASOUND COMPLETE COMPARISON:  CT scans July 10, 2018 and August 01, 2018 FINDINGS: Gallbladder: Patient is status post cholecystectomy. A stone is again identified in the gallbladder stump measuring 9 mm today versus 6 mm on the recent CT scan. The difference in size is probably  technical. Common bile duct: Diameter: 8 mm Liver: No focal mass. The liver is enlarged to 20 cm. Portal vein is patent on color Doppler imaging with normal direction of blood flow towards the liver. IVC: No abnormality visualized. Pancreas: A complex hypoechoic region is seen inferior to the pancreatic head measuring 4.8 x 3.8 x 5.0 cm. Spleen: Size and appearance within normal limits. Right Kidney: Length: 10.7 cm.  Increased cortical echogenicity. Left Kidney: Length: 10.2 cm.  Increased cortical echogenicity. Abdominal aorta: No  aneurysm visualized. Other findings: None. IMPRESSION: 1. There is a hypoechoic masslike region measuring 4.8 x 3.8 x 5.0 cm inferior to the pancreatic head. Given previous CT imaging, this could represent hematoma or biloma. Infection is considered less likely as is a neoplasm. Recommend attention on follow-up. 2. Small stone in the gallbladder stump. The patient is status post cholecystectomy. 3. Mild ascites. 4. Common bile duct dilatation may simply be due to previous cholecystectomy. Recommend correlation with labs. 5. Hepatomegaly. 6. Increased echogenicity in the renal cortices consistent with medical renal disease. Electronically Signed   By: Dorise Bullion III M.D   On: 08/18/2018 16:02     Medications:   . sodium chloride    . sodium chloride    . sodium chloride    . sodium chloride    . ferric gluconate (FERRLECIT/NULECIT) IV 125 mg (08/17/18 1546)   . allopurinol  100 mg Oral Daily  . amLODipine  10 mg Oral Daily  . atorvastatin  40 mg Oral Daily  . carvedilol  12.5 mg Oral BID WC  . Chlorhexidine Gluconate Cloth  6 each Topical Q0600  . clopidogrel  75 mg Oral Q breakfast  . darbepoetin (ARANESP) injection - NON-DIALYSIS  200 mcg Subcutaneous Q Tue-1800  . feeding supplement  1 Container Oral BID BM  . feeding supplement (PRO-STAT SUGAR FREE 64)  30 mL Oral TID WC  . fenofibrate  160 mg Oral Daily  . gabapentin  300 mg Oral QHS  . heparin  5,000  Units Subcutaneous Q8H  . hydrALAZINE  25 mg Oral Q8H  . insulin aspart  0-9 Units Subcutaneous TID WC  . lipase/protease/amylase  24,000 Units Oral TID WC  . mometasone-formoterol  2 puff Inhalation BID  . pantoprazole  40 mg Oral Daily  . vitamin B-12  1,000 mcg Oral Daily   sodium chloride, sodium chloride, sodium chloride, sodium chloride, acetaminophen, albuterol, alteplase, alum & mag hydroxide-simeth, calcium carbonate, heparin, HYDROmorphone (DILAUDID) injection, HYDROmorphone, lidocaine (PF), lidocaine-prilocaine, pentafluoroprop-tetrafluoroeth, polyethylene glycol  Assessment/ Plan:   Chronic kidney disease stage V.  2 L removed with dialysis 08/17/2018  Started clip process, still waiting.  Plan dialysis 08/19/2018.  Hypertension/volume continue with fluid removal with dialysis  Anemia iron stores low will replete with IV iron  Bones PTH 80  Diastolic dysfunction continue with fluid removal with dialysis  Hypokalemia replete with oral potassium supplementation increase dialysate potassium  History of GIB with EGD and cautery 2011  History of esophageal reflux disease  CAD stable continues on Coreg statin and Plavix  History of recurrent pancreatitis continues on pancreatic enzyme supplementation   LOS: 3 [name] @TODAY @12 :44 PM

## 2018-08-20 LAB — HEMOGLOBIN AND HEMATOCRIT, BLOOD
HCT: 27.5 % — ABNORMAL LOW (ref 36.0–46.0)
Hemoglobin: 8.2 g/dL — ABNORMAL LOW (ref 12.0–15.0)

## 2018-08-20 LAB — GLUCOSE, CAPILLARY
Glucose-Capillary: 90 mg/dL (ref 70–99)
Glucose-Capillary: 117 mg/dL — ABNORMAL HIGH (ref 70–99)

## 2018-08-20 NOTE — Progress Notes (Signed)
PROGRESS NOTE    [name]  DJM:[id number] DOB: 10/04/1950 DOA: 08/14/2018 PCP: Bernerd Limbo, MD   Brief Narrative:  HPI on 08/15/2018 by Dr. Garret Reddish [name] is a 67 y.o. female with history of CAD, abdominal aortic aneurysm status post repair, recently admitted for pancreatitis following which patient was again admitted with renal failure requiring dialysis and eventually was discharged home after doing well and was off dialysis and followed up with her primary care physician yesterday and was found to have increasing peripheral edema for which patient was given Lasix 120 mg.  Labs revealed low potassium and was advised to come to the ER.  Patient otherwise denies any chest pain shortness of breath nausea vomiting or diarrhea.  Interim history Patient admitted for acute on chronic kidney disease with pulmonary edema and volume overload as well as hypokalemia.  Nephrology consulted and appreciated, HD and CLIP process started.  Assessment & Plan   Acute on chronic kidney disease, stage V with pulmonary edema and volume overload -Nephrology consulted and appreciated -Hemodialysis started on 08/15/2018; HD on 12/12 -Clip process started and pending   Severe hypokalemia, hypomagnesemia -Resolved with replacement, continue to monitor  -Magnesium 2.1  Recent acute pancreatitis/Abdominal pain  -Patient with back and flank pain which states she has been ongoing for several weeks -Lipase 198 -LFTs WNL -Continue Creon -ordered KUB, reviewed and shows nonobstructive gas pattern -Abd [place] ordered: Hypoechoic masslike region measuring 4.8 x 3.8 x 5 cm inferior to the pancreatic head.  This could represent hematoma or biloma.  Small gallstone in the gallbladder stump, status post cholecystectomy.  Common bile duct dilatation. -Discussed ultrasound findings with patient, she states that she has been following up with gastroenterology as an outpatient. -Reviewed CT scan of the  abdomen pelvis from 08/01/2018, slightly hyperdense complex process is bending over 8 cm in of the pancreatic head.  6 mm stone in the gallbladder stump -Patient feels her pain is now well controlled with both oral and IV pain medications -will advance diet today and monitor   Anemia of chronic kidney disease -hemoglobin currently 8.3, appears to be close to baseline between 8 and 9 -Continue Aranesp, ferric gluconate -Continue to monitor CBC  Coronary artery disease -Currently chest pain-free -Continue Plavix, statin, Coreg  Diabetes mellitus, type II -not on any home medications -appears to be diet controlled -CBGs have been controlled -A1c 5.3 on 05/29/2018  COPD with chronic hypoxic respiratory failure -Patient uses 2 L of home oxygen at night -Currently stable  Essential hypertension -BP currently stable.  Continue  Coreg, amlodipine, hydralazine  Chronic diastolic dysfunction -Echocardiogram 05/30/2018 shows an EF of 60 to 22%, grade 2 diastolic dysfunction -Continue volume control with hemodialysis  Aortic atherosclerosis  DVT Prophylaxis  Heparin  Code Status: Full  Family Communication: None at bedside  Disposition Plan: Admitted. Suspect home pending HD and CLIP process   Consultants Nephrology   Procedures  HD Renal US  Antibiotics   Anti-infectives (From admission, onward)   None      Subjective:   [name] seen and examined today.  His abdominal pain is much improved this morning would like to eat something.  Denies current chest pain, shortness of breath, nausea or vomiting, diarrhea or constipation, dizziness or headache.    Objective:   Vitals:   08/19/18 2022 08/20/18 0458 08/20/18 0737 08/20/18 0912  BP: (!) 155/44 (!) 156/50  (!) 131/43  Pulse: 61 (!) 57  63  Resp: 18 18  18  Temp: 98.1 F (36.7 C) 98.4 F (36.9 C)  98.2 F (36.8 C)  TempSrc: Oral Oral  Oral  SpO2: 98% 96% 94% 94%  Weight: 58.4 kg     Height:         Intake/Output Summary (Last 24 hours) at 08/20/2018 1018 Last data filed at 08/20/2018 0935 Gross per 24 hour  Intake 480 ml  Output 1913 ml  Net -1433 ml   Filed Weights   08/19/18 1355 08/19/18 1751 08/19/18 2022  Weight: 60.2 kg 58.4 kg 58.4 kg   Exam  General: Well developed, thin, NAD, appears stated age  64: NCAT, mucous membranes moist.   Neck: Supple  Cardiovascular: S1 S2 auscultated, soft SEM, RRR  Respiratory: Clear to auscultation bilaterally with equal chest rise  Abdomen: Soft, nontender, nondistended, + bowel sounds  Extremities: warm dry without cyanosis clubbing. LE edema B/L  Neuro: AAOx3, nonfocal  Psych: Normal affect and demeanor, pleasant   Data Reviewed: I have personally reviewed following labs and imaging studies  CBC: Recent Labs  Lab 08/14/18 2239 08/15/18 0843 08/17/18 1327 08/18/18 0447 08/19/18 0447  WBC 9.3 9.3 7.9 6.8  --   NEUTROABS  --  8.3*  --   --   --   HGB 8.7* 7.8* 8.1* 8.0* 8.3*  HCT 27.4* 24.9* 26.3* 25.9* 28.3*  MCV 89.3 89.2 90.4 90.9  --   PLT 259 258 290 258  --    Basic Metabolic Panel: Recent Labs  Lab 08/15/18 0843 08/16/18 0622 08/17/18 0339 08/17/18 1500 08/18/18 0447 08/19/18 0447  NA 138 137 139  --  141 141  K 2.1* 2.7* 3.6  --  3.4* 4.8  CL 101 102 103  --  102 104  CO2 19* 20* 23  --  27 27  GLUCOSE 119* 136* 126*  --  103* 103*  BUN 122* 84* 92*  --  39* 48*  CREATININE 3.68* 2.83* 3.02*  --  1.77* 2.22*  CALCIUM 7.1* 7.7* 8.3*  --  8.3* 8.5*  MG 0.8* 1.4*  --  2.1  --   --   PHOS  --   --   --   --  2.7  --    GFR: Estimated Creatinine Clearance: 23.3 mL/min (A) (by C-G formula based on SCr of 2.22 mg/dL (H)). Liver Function Tests: Recent Labs  Lab 08/14/18 1709 08/15/18 0843 08/17/18 0339 08/18/18 0447 08/19/18 0447  AST 12 14* 17  --  20  ALT 4 7 7   --  7  ALKPHOS 39 35* 33*  --  32*  BILITOT 0.9 1.1 0.8  --  0.6  PROT 6.4 5.4* 5.5*  --  5.4*  ALBUMIN 3.3* 2.4* 2.5*  2.4* 2.5*   Recent Labs  Lab 08/15/18 0843 08/17/18 0339 08/19/18 0447  LIPASE 106* 267* 198*   No results for input(s): AMMONIA in the last 168 hours. Coagulation Profile: No results for input(s): INR, PROTIME in the last 168 hours. Cardiac Enzymes: No results for input(s): CKTOTAL, CKMB, CKMBINDEX, TROPONINI in the last 168 hours. BNP (last 3 results) No results for input(s): PROBNP in the last 8760 hours. HbA1C: No results for input(s): HGBA1C in the last 72 hours. CBG: Recent Labs  Lab 08/19/18 0738 08/19/18 1125 08/19/18 1827 08/19/18 2107 08/20/18 0720  GLUCAP 110* 126* 100* 184* 90   Lipid Profile: No results for input(s): CHOL, HDL, LDLCALC, TRIG, CHOLHDL, LDLDIRECT in the last 72 hours. Thyroid Function Tests: No results for  input(s): TSH, T4TOTAL, FREET4, T3FREE, THYROIDAB in the last 72 hours. Anemia Panel: No results for input(s): VITAMINB12, FOLATE, FERRITIN, TIBC, IRON, RETICCTPCT in the last 72 hours. Urine analysis:    Component Value Date/Time   COLORURINE YELLOW 08/15/2018 [place] 08/15/2018 0656   LABSPEC 1.006 08/15/2018 0656   PHURINE 5.0 08/15/2018 0656   GLUCOSEU NEGATIVE 08/15/2018 0656   HGBUR NEGATIVE 08/15/2018 0656   BILIRUBINUR NEGATIVE 08/15/2018 0656   [place] 08/15/2018 0656   PROTEINUR NEGATIVE 08/15/2018 0656   UROBILINOGEN 0.2 03/04/2011 2107   NITRITE NEGATIVE 08/15/2018 0656   LEUKOCYTESUR NEGATIVE 08/15/2018 0656   Sepsis Labs: @LABRCNTIP (procalcitonin:4,lacticidven:4)  )No results found for this or any previous visit (from the past 240 hour(s)).    Radiology Studies: US Abdomen Complete  Result Date: 08/18/2018 CLINICAL DATA:  Abdominal pain for a month. Cholecystectomy. Pancreatitis. EXAM: ABDOMEN ULTRASOUND COMPLETE COMPARISON:  CT scans July 10, 2018 and August 01, 2018 FINDINGS: Gallbladder: Patient is status post cholecystectomy. A stone is again identified in the gallbladder  stump measuring 9 mm today versus 6 mm on the recent CT scan. The difference in size is probably technical. Common bile duct: Diameter: 8 mm Liver: No focal mass. The liver is enlarged to 20 cm. Portal vein is patent on color Doppler imaging with normal direction of blood flow towards the liver. IVC: No abnormality visualized. Pancreas: A complex hypoechoic region is seen inferior to the pancreatic head measuring 4.8 x 3.8 x 5.0 cm. Spleen: Size and appearance within normal limits. Right Kidney: Length: 10.7 cm.  Increased cortical echogenicity. Left Kidney: Length: 10.2 cm.  Increased cortical echogenicity. Abdominal aorta: No aneurysm visualized. Other findings: None. IMPRESSION: 1. There is a hypoechoic masslike region measuring 4.8 x 3.8 x 5.0 cm inferior to the pancreatic head. Given previous CT imaging, this could represent hematoma or biloma. Infection is considered less likely as is a neoplasm. Recommend attention on follow-up. 2. Small stone in the gallbladder stump. The patient is status post cholecystectomy. 3. Mild ascites. 4. Common bile duct dilatation may simply be due to previous cholecystectomy. Recommend correlation with labs. 5. Hepatomegaly. 6. Increased echogenicity in the renal cortices consistent with medical renal disease. Electronically Signed   By: Dorise Bullion III M.D   On: 08/18/2018 16:02     Scheduled Meds: . allopurinol  100 mg Oral Daily  . amLODipine  10 mg Oral Daily  . atorvastatin  40 mg Oral Daily  . carvedilol  12.5 mg Oral BID WC  . Chlorhexidine Gluconate Cloth  6 each Topical Q0600  . clopidogrel  75 mg Oral Q breakfast  . darbepoetin (ARANESP) injection - NON-DIALYSIS  200 mcg Subcutaneous Q Tue-1800  . feeding supplement  1 Container Oral BID BM  . feeding supplement (PRO-STAT SUGAR FREE 64)  30 mL Oral TID WC  . fenofibrate  160 mg Oral Daily  . gabapentin  300 mg Oral QHS  . heparin  5,000 Units Subcutaneous Q8H  . hydrALAZINE  25 mg Oral Q8H  .  insulin aspart  0-9 Units Subcutaneous TID WC  . lipase/protease/amylase  24,000 Units Oral TID WC  . mometasone-formoterol  2 puff Inhalation BID  . pantoprazole  40 mg Oral Daily  . vitamin B-12  1,000 mcg Oral Daily   Continuous Infusions: . ferric gluconate (FERRLECIT/NULECIT) IV Stopped (08/19/18 1819)     LOS: 4 days   Time Spent in minutes   30 minutes  Vita Currin D.O. on  08/20/2018 at 10:18 AM  Between 7am to 7pm - Please see pager noted on [email or web address]  After 7pm go to [email or web address]  And look for the night coverage person covering for me after hours  [facility] Office  [phone]

## 2018-08-20 NOTE — Progress Notes (Signed)
[facility] ROUNDING NOTE   Subjective:   No complaints this morning appears to be doing well.  Underwent successful dialysis with 1.9 L removed.  She has had dialysis #1 08/15/2018 #2 08/17/2018 and #3 08/17/2013 2019.  Blood pressure 131/43 pulse 63 temperature 98.2 O2 sats 94% room air  Sodium 141 potassium 4.8 chloride 104 CO2 27 BUN 48 creatinine 2.22 glucose 103 calcium 8.5 albumin 2.5 lipase 198 AST 20 ALT 7 hemoglobin 8.3     Objective:  Vital signs in last 24 hours:  Temp:  [98 F (36.7 C)-98.4 F (36.9 C)] 98.2 F (36.8 C) (12/15 0912) Pulse Rate:  [57-69] 63 (12/15 0912) Resp:  [16-18] 18 (12/15 0912) BP: (129-159)/(43-69) 131/43 (12/15 0912) SpO2:  [94 %-99 %] 94 % (12/15 0912) Weight:  [58.4 kg-60.2 kg] 58.4 kg (12/14 2022)  Weight change: -1.4 kg Filed Weights   08/19/18 1355 08/19/18 1751 08/19/18 2022  Weight: 60.2 kg 58.4 kg 58.4 kg    Intake/Output: I/O last 3 completed shifts: In: 540 [P.O.:540] Out: 1913 [Urine:100; UUVOZ:3664]   Intake/Output this shift:  Total I/O In: 360 [P.O.:360] Out: 100 [Urine:100]   Awake alert no acute distress cachectic appearing lady CVS- RRR JVP mildly elevated. RS- CTA diminished at bases ABD- BS present soft non-distended EXT-2+ pitting edema left AV fistula with thrill and bruit   Basic Metabolic Panel: Recent Labs  Lab 08/15/18 0843 08/16/18 0622 08/17/18 0339 08/17/18 1500 08/18/18 0447 08/19/18 0447  NA 138 137 139  --  141 141  K 2.1* 2.7* 3.6  --  3.4* 4.8  CL 101 102 103  --  102 104  CO2 19* 20* 23  --  27 27  GLUCOSE 119* 136* 126*  --  103* 103*  BUN 122* 84* 92*  --  39* 48*  CREATININE 3.68* 2.83* 3.02*  --  1.77* 2.22*  CALCIUM 7.1* 7.7* 8.3*  --  8.3* 8.5*  MG 0.8* 1.4*  --  2.1  --   --   PHOS  --   --   --   --  2.7  --     Liver Function Tests: Recent Labs  Lab 08/14/18 1709 08/15/18 0843 08/17/18 0339 08/18/18 0447 08/19/18 0447  AST 12 14* 17  --  20  ALT  4 7 7   --  7  ALKPHOS 39 35* 33*  --  32*  BILITOT 0.9 1.1 0.8  --  0.6  PROT 6.4 5.4* 5.5*  --  5.4*  ALBUMIN 3.3* 2.4* 2.5* 2.4* 2.5*   Recent Labs  Lab 08/15/18 0843 08/17/18 0339 08/19/18 0447  LIPASE 106* 267* 198*   No results for input(s): AMMONIA in the last 168 hours.  CBC: Recent Labs  Lab 08/14/18 2239 08/15/18 0843 08/17/18 1327 08/18/18 0447 08/19/18 0447  WBC 9.3 9.3 7.9 6.8  --   NEUTROABS  --  8.3*  --   --   --   HGB 8.7* 7.8* 8.1* 8.0* 8.3*  HCT 27.4* 24.9* 26.3* 25.9* 28.3*  MCV 89.3 89.2 90.4 90.9  --   PLT 259 258 290 258  --     Cardiac Enzymes: No results for input(s): CKTOTAL, CKMB, CKMBINDEX, TROPONINI in the last 168 hours.  BNP: Invalid input(s): POCBNP  CBG: Recent Labs  Lab 08/19/18 0738 08/19/18 1125 08/19/18 1827 08/19/18 2107 08/20/18 0720  GLUCAP 110* 126* 100* 184* 90    Microbiology: Results for orders placed or performed during the hospital encounter of  07/08/18  MRSA PCR Screening     Status: None   Collection Time: 07/08/18  6:36 PM  Result Value Ref Range Status   MRSA by PCR NEGATIVE NEGATIVE Final    Comment:        The GeneXpert MRSA Assay (FDA approved for NASAL specimens only), is one component of a comprehensive MRSA colonization surveillance program. It is not intended to diagnose MRSA infection nor to guide or monitor treatment for MRSA infections. Performed at [facility], [place] [address], [place], [place] [address]   Culture, blood (Routine X 2) w Reflex to ID Panel     Status: None   Collection Time: 07/10/18 10:36 AM  Result Value Ref Range Status   Specimen Description   Final    BLOOD RIGHT ARM Performed at [facility] [address] [place], [place] [address]    Special Requests   Final    BOTTLES DRAWN AEROBIC AND ANAEROBIC Blood Culture adequate volume Performed at [facility] [address] [place], [place] [address]     Culture   Final    NO GROWTH 5 DAYS Performed at [facility], [place] [address], [place], [place] [address]    Report Status 07/15/2018 FINAL  Final  Culture, blood (Routine X 2) w Reflex to ID Panel     Status: None   Collection Time: 07/10/18 10:38 AM  Result Value Ref Range Status   Specimen Description   Final    BLOOD RIGHT HAND Performed at [facility] [address], [place], [place] [address]    Special Requests   Final    BOTTLES DRAWN AEROBIC ONLY Blood Culture adequate volume Performed at [facility] [address], [place], [place] [address]    Culture   Final    NO GROWTH 5 DAYS Performed at [facility], [place] [address] [place],  [address]    Report Status 07/15/2018 FINAL  Final    Coagulation Studies: No results for input(s): LABPROT, INR in the last 72 hours.  Urinalysis: No results for input(s): COLORURINE, LABSPEC, PHURINE, GLUCOSEU, HGBUR, BILIRUBINUR, KETONESUR, PROTEINUR, UROBILINOGEN, NITRITE, LEUKOCYTESUR in the last 72 hours.  Invalid input(s): APPERANCEUR    Imaging: US Abdomen Complete  Result Date: 08/18/2018 CLINICAL DATA:  Abdominal pain for a month. Cholecystectomy. Pancreatitis. EXAM: ABDOMEN ULTRASOUND COMPLETE COMPARISON:  CT scans July 10, 2018 and August 01, 2018 FINDINGS: Gallbladder: Patient is status post cholecystectomy. A stone is again identified in the gallbladder stump measuring 9 mm today versus 6 mm on the recent CT scan. The difference in size is probably technical. Common bile duct: Diameter: 8 mm Liver: No focal mass. The liver is enlarged to 20 cm. Portal vein is patent on color Doppler imaging with normal direction of blood flow towards the liver. IVC: No abnormality visualized. Pancreas: A complex hypoechoic region is seen inferior to the pancreatic head measuring 4.8 x 3.8 x 5.0 cm. Spleen: Size and appearance within normal limits. Right Kidney: Length: 10.7 cm.   Increased cortical echogenicity. Left Kidney: Length: 10.2 cm.  Increased cortical echogenicity. Abdominal aorta: No aneurysm visualized. Other findings: None. IMPRESSION: 1. There is a hypoechoic masslike region measuring 4.8 x 3.8 x 5.0 cm inferior to the pancreatic head. Given previous CT imaging, this could represent hematoma or biloma. Infection is considered less likely as is a neoplasm. Recommend attention on follow-up. 2. Small stone in the gallbladder stump. The  patient is status post cholecystectomy. 3. Mild ascites. 4. Common bile duct dilatation may simply be due to previous cholecystectomy. Recommend correlation with labs. 5. Hepatomegaly. 6. Increased echogenicity in the renal cortices consistent with medical renal disease. Electronically Signed   By: Dorise Bullion III M.D   On: 08/18/2018 16:02     Medications:   . ferric gluconate (FERRLECIT/NULECIT) IV Stopped (08/19/18 1819)   . allopurinol  100 mg Oral Daily  . amLODipine  10 mg Oral Daily  . atorvastatin  40 mg Oral Daily  . carvedilol  12.5 mg Oral BID WC  . Chlorhexidine Gluconate Cloth  6 each Topical Q0600  . clopidogrel  75 mg Oral Q breakfast  . darbepoetin (ARANESP) injection - NON-DIALYSIS  200 mcg Subcutaneous Q Tue-1800  . feeding supplement  1 Container Oral BID BM  . feeding supplement (PRO-STAT SUGAR FREE 64)  30 mL Oral TID WC  . fenofibrate  160 mg Oral Daily  . gabapentin  300 mg Oral QHS  . heparin  5,000 Units Subcutaneous Q8H  . hydrALAZINE  25 mg Oral Q8H  . insulin aspart  0-9 Units Subcutaneous TID WC  . lipase/protease/amylase  24,000 Units Oral TID WC  . mometasone-formoterol  2 puff Inhalation BID  . pantoprazole  40 mg Oral Daily  . vitamin B-12  1,000 mcg Oral Daily   acetaminophen, albuterol, alum & mag hydroxide-simeth, calcium carbonate, HYDROmorphone (DILAUDID) injection, HYDROmorphone, polyethylene glycol  Assessment/ Plan:   Chronic kidney disease stage V.    Started clip process,  still waiting.  Has undergone 3 successful dialysis treatments.  Plan next dialysis treatment Tuesday, 08/22/2018  Hypertension/volume continue with fluid removal with dialysis appears to be under much better control  Anemia iron stores low repleting with IV iron  Bones PTH 80  Diastolic dysfunction continue with fluid removal with dialysis  Hypokalemia replete with oral potassium supplementation increase dialysate potassium  History of GIB with EGD and cautery 2011  History of esophageal reflux disease  CAD stable continues on Coreg statin and Plavix  History of recurrent pancreatitis continues on pancreatic enzyme supplementation   LOS: 4 Sherril Croon @TODAY @11 :00 AM

## 2018-08-21 LAB — RENAL FUNCTION PANEL
Albumin: 2.6 g/dL — ABNORMAL LOW (ref 3.5–5.0)
Albumin: 2.7 g/dL — ABNORMAL LOW (ref 3.5–5.0)
Anion gap: 12 (ref 5–15)
Anion gap: 10 (ref 5–15)
BUN: 35 mg/dL — ABNORMAL HIGH (ref 8–23)
BUN: 40 mg/dL — ABNORMAL HIGH (ref 8–23)
CALCIUM: 8.7 mg/dL — AB (ref 8.9–10.3)
CO2: 24 mmol/L (ref 22–32)
CO2: 26 mmol/L (ref 22–32)
Calcium: 8.6 mg/dL — ABNORMAL LOW (ref 8.9–10.3)
Chloride: 99 mmol/L (ref 98–111)
Chloride: 100 mmol/L (ref 98–111)
Creatinine, Ser: 2.11 mg/dL — ABNORMAL HIGH (ref 0.44–1.00)
Creatinine, Ser: 2.32 mg/dL — ABNORMAL HIGH (ref 0.44–1.00)
GFR calc Af Amer: 28 mL/min — ABNORMAL LOW (ref >60)
GFR calc Af Amer: 25 mL/min — ABNORMAL LOW (ref >60)
GFR calc non Af Amer: 24 mL/min — ABNORMAL LOW (ref >60)
GFR calc non Af Amer: 21 mL/min — ABNORMAL LOW (ref >60)
Glucose, Bld: 117 mg/dL — ABNORMAL HIGH (ref 70–99)
Glucose, Bld: 88 mg/dL (ref 70–99)
Phosphorus: 3.4 mg/dL (ref 2.5–4.6)
Phosphorus: 3.6 mg/dL (ref 2.5–4.6)
Potassium: 3.9 mmol/L (ref 3.5–5.1)
Potassium: 4 mmol/L (ref 3.5–5.1)
Sodium: 135 mmol/L (ref 135–145)
Sodium: 136 mmol/L (ref 135–145)

## 2018-08-21 LAB — GLUCOSE, CAPILLARY
Glucose-Capillary: 113 mg/dL — ABNORMAL HIGH (ref 70–99)
Glucose-Capillary: 117 mg/dL — ABNORMAL HIGH (ref 70–99)
Glucose-Capillary: 89 mg/dL (ref 70–99)
Glucose-Capillary: 169 mg/dL — ABNORMAL HIGH (ref 70–99)
Glucose-Capillary: 59 mg/dL — ABNORMAL LOW (ref 70–99)
Glucose-Capillary: 100 mg/dL — ABNORMAL HIGH (ref 70–99)
Glucose-Capillary: 109 mg/dL — ABNORMAL HIGH (ref 70–99)

## 2018-08-21 LAB — MAGNESIUM: Magnesium: 1.6 mg/dL — ABNORMAL LOW (ref 1.7–2.4)

## 2018-08-21 MED ORDER — SIMETHICONE 80 MG PO CHEW: 80.0000 mg | CHEWABLE_TABLET | Freq: Four times a day (QID) | ORAL | Status: DC | PRN

## 2018-08-21 MED ORDER — CHLORHEXIDINE GLUCONATE CLOTH 2 % EX PADS: 6.0000 | MEDICATED_PAD | Freq: Every day | CUTANEOUS | Status: DC

## 2018-08-21 MED ORDER — MAGNESIUM SULFATE 2 GM/50ML IV SOLN
2.0000 g | Freq: Once | INTRAVENOUS | Status: AC
  Administered 2018-08-21: 2 g via INTRAVENOUS
  Filled 2018-08-21: qty 50

## 2018-08-21 MED ORDER — AMLODIPINE BESYLATE 5 MG PO TABS
5.0000 mg | ORAL_TABLET | Freq: Every day | ORAL | Status: DC
  Administered 2018-08-22: 5 mg via ORAL
  Filled 2018-08-21: qty 1

## 2018-08-21 NOTE — Progress Notes (Signed)
Subjective:  No new c/o's - wants to go home soon  Objective Vital signs in last 24 hours: Vitals:   08/20/18 2117 08/21/18 0503 08/21/18 0736 08/21/18 0953  BP: 140/60 (!) 144/53 (!) 148/47   Pulse: 61 60 62   Resp: 18 18 18    Temp: 98.3 F (36.8 C) 98.7 F (37.1 C) 98.7 F (37.1 C)   TempSrc: Oral Oral Oral   SpO2: 97% 95% 95% 93%  Weight: 58.3 kg     Height:       Weight change: -1.9 kg  Intake/Output Summary (Last 24 hours) at 08/21/2018 1321 Last data filed at 08/21/2018 1100 Gross per 24 hour  Intake 950 ml  Output 0 ml  Net 950 ml    Assessment/ Plan: Pt is a 67 y.o. yo female - advanced CKD as OP who was admitted on 08/14/2018 with volume overload, need to initiate dialysis for ESRD now  Assessment/Plan: 1. Renal - new ESRD-  HD started on 12/10- due for fourth treatment tomorrow- have been able to use AVF- for the most part it has gone well- her late husband passed away soon after starting dialysis 10 years ago so this is very emotional for her.  She tells me that she has been clipped to [place], will need to confirm and see if she can be ready for discharge tomorrow after HD- will order first shift   2. HTN/volume- still pretty overloaded, UF as able with HD- decrease amlodipine to give [place] cushion, cont coreg and hydralazine for now but with low threshold to dec/stop those as well   3. Anemia- hgb in the 9's- I see orders for iron and ESA 4. Secondary hyperparathyroidism- phos OK, no binder currently - PTH 80    [name] A [place] [facility]    Labs: Basic Metabolic Panel: Recent Labs  Lab 08/18/18 0447 08/19/18 0447 08/21/18 0356  NA 141 141 135  K 3.4* 4.8 3.9  CL 102 104 99  CO2 27 27 24   GLUCOSE 103* 103* 117*  BUN 39* 48* 35*  CREATININE 1.77* 2.22* 2.11*  CALCIUM 8.3* 8.5* 8.7*  PHOS 2.7  --  3.4   Liver Function Tests: Recent Labs  Lab 08/15/18 0843 08/17/18 0339 08/18/18 0447 08/19/18 0447 08/21/18 0356  AST 14* 17  --  20  --   ALT 7 7  --  7   --   ALKPHOS 35* 33*  --  32*  --   BILITOT 1.1 0.8  --  0.6  --   PROT 5.4* 5.5*  --  5.4*  --   ALBUMIN 2.4* 2.5* 2.4* 2.5* 2.6*   Recent Labs  Lab 08/15/18 0843 08/17/18 0339 08/19/18 0447  LIPASE 106* 267* 198*   No results for input(s): AMMONIA in the last 168 hours. CBC: Recent Labs  Lab 08/14/18 2239 08/15/18 0843 08/17/18 1327 08/18/18 0447 08/19/18 0447 08/20/18 1039  WBC 9.3 9.3 7.9 6.8  --   --   NEUTROABS  --  8.3*  --   --   --   --   HGB 8.7* 7.8* 8.1* 8.0* 8.3* 8.2*  HCT 27.4* 24.9* 26.3* 25.9* 28.3* 27.5*  MCV 89.3 89.2 90.4 90.9  --   --   PLT 259 258 290 258  --   --    Cardiac Enzymes: No results for input(s): CKTOTAL, CKMB, CKMBINDEX, TROPONINI in the last 168 hours. CBG: Recent Labs  Lab 08/20/18 1129 08/20/18 1703 08/20/18 2117 08/21/18 0737 08/21/18 1131  GLUCAP 117* 113* 117* 89 169*    Iron Studies: No results for input(s): IRON, TIBC, TRANSFERRIN, FERRITIN in the last 72 hours. Studies/Results: No results found. Medications: Infusions: . ferric gluconate (FERRLECIT/NULECIT) IV Stopped (08/19/18 1819)    Scheduled Medications: . allopurinol  100 mg Oral Daily  . amLODipine  10 mg Oral Daily  . atorvastatin  40 mg Oral Daily  . carvedilol  12.5 mg Oral BID WC  . Chlorhexidine Gluconate Cloth  6 each Topical Q0600  . clopidogrel  75 mg Oral Q breakfast  . darbepoetin (ARANESP) injection - NON-DIALYSIS  200 mcg Subcutaneous Q Tue-1800  . feeding supplement  1 Container Oral BID BM  . feeding supplement (PRO-STAT SUGAR FREE 64)  30 mL Oral TID WC  . fenofibrate  160 mg Oral Daily  . gabapentin  300 mg Oral QHS  . heparin  5,000 Units Subcutaneous Q8H  . hydrALAZINE  25 mg Oral Q8H  . insulin aspart  0-9 Units Subcutaneous TID WC  . lipase/protease/amylase  24,000 Units Oral TID WC  . mometasone-formoterol  2 puff Inhalation BID  . pantoprazole  40 mg Oral Daily  . vitamin B-12  1,000 mcg Oral Daily    have reviewed scheduled  and prn medications.  Physical Exam: General: appears depressed  Heart:  RRR Lungs: dec BS at bases Abdomen: diffusely tender Extremities: pitting edema Dialysis Access: left upper AVF- bruised      08/21/2018,1:21 PM  LOS: 5 days

## 2018-08-21 NOTE — Progress Notes (Signed)
PROGRESS NOTE    [name]  KGU:[id number] DOB: [birth year] DOA: 08/14/2018 PCP: Bernerd Limbo, MD   Brief Narrative:  HPI on 08/15/2018 by Dr. Garret Reddish [name] is a 67 y.o. female with history of CAD, abdominal aortic aneurysm status post repair, recently admitted for pancreatitis following which patient was again admitted with renal failure requiring dialysis and eventually was discharged home after doing well and was off dialysis and followed up with her primary care physician yesterday and was found to have increasing peripheral edema for which patient was given Lasix 120 mg.  Labs revealed low potassium and was advised to come to the ER.  Patient otherwise denies any chest pain shortness of breath nausea vomiting or diarrhea.  Interim history Patient admitted for acute on chronic kidney disease with pulmonary edema and volume overload as well as hypokalemia.  Nephrology consulted and appreciated, HD and CLIP process started.  Assessment & Plan   Acute on chronic kidney disease, stage V with pulmonary edema and volume overload -Nephrology consulted and appreciated -Hemodialysis started on 08/15/2018; HD on 12/12 -Clip process started and pending   Severe hypokalemia, hypomagnesemia -Potassium 3.9. -Magnesium 1.6, will replace -Continue to monitor  Recent acute pancreatitis/Abdominal pain  -Patient with back and flank pain which states she has been ongoing for several weeks -Lipase 198 -LFTs WNL -Continue Creon -ordered KUB, reviewed and shows nonobstructive gas pattern -Abd [place] ordered: Hypoechoic masslike region measuring 4.8 x 3.8 x 5 cm inferior to the pancreatic head.  This could represent hematoma or biloma.  Small gallstone in the gallbladder stump, status post cholecystectomy.  Common bile duct dilatation. -Discussed ultrasound findings with patient, she states that she has been following up with gastroenterology as an outpatient. -Reviewed CT scan of the  abdomen pelvis from 08/01/2018, slightly hyperdense complex process is bending over 8 cm in of the pancreatic head.  6 mm stone in the gallbladder stump -Patient feels her pain is now well controlled with both oral and IV pain medications -Experienced further pain for soft diet, scaled back to clear liquids today.  Anemia of chronic kidney disease -hemoglobin currently 8.2, appears to be close to baseline between 8 and 9 -Continue Aranesp, ferric gluconate -Continue to monitor CBC  Coronary artery disease -Currently chest pain-free -Continue Plavix, statin, Coreg  Diabetes mellitus, type II -not on any home medications -appears to be diet controlled -CBGs have been controlled -A1c 5.3 on 05/29/2018  COPD with chronic hypoxic respiratory failure -Patient uses 2 L of home oxygen at night -Currently stable  Essential hypertension -BP currently stable.  Continue  Coreg, amlodipine, hydralazine  Chronic diastolic dysfunction -Echocardiogram 05/30/2018 shows an EF of 60 to 62%, grade 2 diastolic dysfunction -Continue volume control with hemodialysis  Aortic atherosclerosis  DVT Prophylaxis  Heparin  Code Status: Full  Family Communication: None at bedside  Disposition Plan: Admitted. Suspect home pending HD and CLIP process   Consultants Nephrology   Procedures  HD Renal US  Antibiotics   Anti-infectives (From admission, onward)   None      Subjective:   [name] seen and examined today.  Feels abdominal pain has returned after eating soft diet.  Denies current nausea or vomiting.  Denies current chest pain, shortness of breath, dizziness or headache. Objective:   Vitals:   08/20/18 2117 08/21/18 0503 08/21/18 0736 08/21/18 0953  BP: 140/60 (!) 144/53 (!) 148/47   Pulse: 61 60 62   Resp: 18 18 18    Temp:  98.3 F (36.8 C) 98.7 F (37.1 C) 98.7 F (37.1 C)   TempSrc: Oral Oral Oral   SpO2: 97% 95% 95% 93%  Weight: 58.3 kg     Height:         Intake/Output Summary (Last 24 hours) at 08/21/2018 1549 Last data filed at 08/21/2018 1100 Gross per 24 hour  Intake 950 ml  Output 0 ml  Net 950 ml   Filed Weights   08/19/18 1751 08/19/18 2022 08/20/18 2117  Weight: 58.4 kg 58.4 kg 58.3 kg   Exam  General: Well developed, well nourished, NAD, appears stated age  72: NCAT, mucous membranes moist.   Neck: Supple  Cardiovascular: S1 S2 auscultated, soft SEM, RRR  Respiratory: Clear to auscultation bilaterally with equal chest rise  Abdomen: Soft, nontender, nondistended, + bowel sounds  Extremities: warm dry without cyanosis clubbing. LE edema B/L   Neuro: AAOx3, nonfocal  Psych: Normal affect and demeanor   Data Reviewed: I have personally reviewed following labs and imaging studies  CBC: Recent Labs  Lab 08/14/18 2239 08/15/18 0843 08/17/18 1327 08/18/18 0447 08/19/18 0447 08/20/18 1039  WBC 9.3 9.3 7.9 6.8  --   --   NEUTROABS  --  8.3*  --   --   --   --   HGB 8.7* 7.8* 8.1* 8.0* 8.3* 8.2*  HCT 27.4* 24.9* 26.3* 25.9* 28.3* 27.5*  MCV 89.3 89.2 90.4 90.9  --   --   PLT 259 258 290 258  --   --    Basic Metabolic Panel: Recent Labs  Lab 08/15/18 0843 08/16/18 0622 08/17/18 0339 08/17/18 1500 08/18/18 0447 08/19/18 0447 08/21/18 0356  NA 138 137 139  --  141 141 135  K 2.1* 2.7* 3.6  --  3.4* 4.8 3.9  CL 101 102 103  --  102 104 99  CO2 19* 20* 23  --  27 27 24   GLUCOSE 119* 136* 126*  --  103* 103* 117*  BUN 122* 84* 92*  --  39* 48* 35*  CREATININE 3.68* 2.83* 3.02*  --  1.77* 2.22* 2.11*  CALCIUM 7.1* 7.7* 8.3*  --  8.3* 8.5* 8.7*  MG 0.8* 1.4*  --  2.1  --   --  1.6*  PHOS  --   --   --   --  2.7  --  3.4   GFR: Estimated Creatinine Clearance: 24.5 mL/min (A) (by C-G formula based on SCr of 2.11 mg/dL (H)). Liver Function Tests: Recent Labs  Lab 08/14/18 1709 08/15/18 0843 08/17/18 0339 08/18/18 0447 08/19/18 0447 08/21/18 0356  AST 12 14* 17  --  20  --   ALT 4 7 7   --   7  --   ALKPHOS 39 35* 33*  --  32*  --   BILITOT 0.9 1.1 0.8  --  0.6  --   PROT 6.4 5.4* 5.5*  --  5.4*  --   ALBUMIN 3.3* 2.4* 2.5* 2.4* 2.5* 2.6*   Recent Labs  Lab 08/15/18 0843 08/17/18 0339 08/19/18 0447  LIPASE 106* 267* 198*   No results for input(s): AMMONIA in the last 168 hours. Coagulation Profile: No results for input(s): INR, PROTIME in the last 168 hours. Cardiac Enzymes: No results for input(s): CKTOTAL, CKMB, CKMBINDEX, TROPONINI in the last 168 hours. BNP (last 3 results) No results for input(s): PROBNP in the last 8760 hours. HbA1C: No results for input(s): HGBA1C in the last 72 hours. CBG: Recent Labs  Lab 08/20/18 1129 08/20/18 1703 08/20/18 2117 08/21/18 0737 08/21/18 1131  GLUCAP 117* 113* 117* 89 169*   Lipid Profile: No results for input(s): CHOL, HDL, LDLCALC, TRIG, CHOLHDL, LDLDIRECT in the last 72 hours. Thyroid Function Tests: No results for input(s): TSH, T4TOTAL, FREET4, T3FREE, THYROIDAB in the last 72 hours. Anemia Panel: No results for input(s): VITAMINB12, FOLATE, FERRITIN, TIBC, IRON, RETICCTPCT in the last 72 hours. Urine analysis:    Component Value Date/Time   COLORURINE YELLOW 08/15/2018 [place] 08/15/2018 0656   LABSPEC 1.006 08/15/2018 0656   PHURINE 5.0 08/15/2018 0656   GLUCOSEU NEGATIVE 08/15/2018 0656   HGBUR NEGATIVE 08/15/2018 0656   BILIRUBINUR NEGATIVE 08/15/2018 0656   [place] 08/15/2018 0656   PROTEINUR NEGATIVE 08/15/2018 0656   UROBILINOGEN 0.2 03/04/2011 2107   NITRITE NEGATIVE 08/15/2018 0656   LEUKOCYTESUR NEGATIVE 08/15/2018 0656   Sepsis Labs: @LABRCNTIP (procalcitonin:4,lacticidven:4)  )No results found for this or any previous visit (from the past 240 hour(s)).    Radiology Studies: No results found.   Scheduled Meds: . allopurinol  100 mg Oral Daily  . [START ON 08/22/2018] amLODipine  5 mg Oral Daily  . atorvastatin  40 mg Oral Daily  . carvedilol  12.5 mg  Oral BID WC  . Chlorhexidine Gluconate Cloth  6 each Topical Q0600  . [START ON 08/22/2018] Chlorhexidine Gluconate Cloth  6 each Topical Q0600  . clopidogrel  75 mg Oral Q breakfast  . darbepoetin (ARANESP) injection - NON-DIALYSIS  200 mcg Subcutaneous Q Tue-1800  . feeding supplement  1 Container Oral BID BM  . feeding supplement (PRO-STAT SUGAR FREE 64)  30 mL Oral TID WC  . fenofibrate  160 mg Oral Daily  . gabapentin  300 mg Oral QHS  . heparin  5,000 Units Subcutaneous Q8H  . hydrALAZINE  25 mg Oral Q8H  . insulin aspart  0-9 Units Subcutaneous TID WC  . lipase/protease/amylase  24,000 Units Oral TID WC  . mometasone-formoterol  2 puff Inhalation BID  . pantoprazole  40 mg Oral Daily  . vitamin B-12  1,000 mcg Oral Daily   Continuous Infusions: . ferric gluconate (FERRLECIT/NULECIT) IV Stopped (08/19/18 1819)     LOS: 5 days   Time Spent in minutes   30 minutes  Marijo Quizon D.O. on 08/21/2018 at 3:49 PM  Between 7am to 7pm - Please see pager noted on [email or web address]  After 7pm go to [email or web address]  And look for the night coverage person covering for me after hours  [facility] Office  [phone]

## 2018-08-21 NOTE — Progress Notes (Signed)
Offered patient the dilaudid pill and explained how the pill and iv dilaudid works, patient stated " I figure I can get  the pill at home,I might as well get the IV here". Rada Zegers, Wonda Cheng, Therapist, sports

## 2018-08-22 LAB — RENAL FUNCTION PANEL
Albumin: 2.8 g/dL — ABNORMAL LOW (ref 3.5–5.0)
Anion gap: 12 (ref 5–15)
BUN: 41 mg/dL — ABNORMAL HIGH (ref 8–23)
CO2: 25 mmol/L (ref 22–32)
Calcium: 9 mg/dL (ref 8.9–10.3)
Chloride: 98 mmol/L (ref 98–111)
Creatinine, Ser: 2.49 mg/dL — ABNORMAL HIGH (ref 0.44–1.00)
GFR calc Af Amer: 23 mL/min — ABNORMAL LOW (ref >60)
GFR calc non Af Amer: 20 mL/min — ABNORMAL LOW (ref >60)
Glucose, Bld: 117 mg/dL — ABNORMAL HIGH (ref 70–99)
Phosphorus: 4.2 mg/dL (ref 2.5–4.6)
Potassium: 4.1 mmol/L (ref 3.5–5.1)
Sodium: 135 mmol/L (ref 135–145)

## 2018-08-22 LAB — HEMOGLOBIN AND HEMATOCRIT, BLOOD
HCT: 24.5 % — ABNORMAL LOW (ref 36.0–46.0)
Hemoglobin: 7.3 g/dL — ABNORMAL LOW (ref 12.0–15.0)

## 2018-08-22 LAB — GLUCOSE, CAPILLARY
Glucose-Capillary: 100 mg/dL — ABNORMAL HIGH (ref 70–99)
Glucose-Capillary: 132 mg/dL — ABNORMAL HIGH (ref 70–99)

## 2018-08-22 LAB — PREPARE RBC (CROSSMATCH)

## 2018-08-22 LAB — MAGNESIUM: MAGNESIUM: 2.1 mg/dL (ref 1.7–2.4)

## 2018-08-22 MED ORDER — HEPARIN SODIUM (PORCINE) 1000 UNIT/ML IJ SOLN
INTRAMUSCULAR | Status: AC
  Administered 2018-08-22: 1200 [IU] via INTRAVENOUS_CENTRAL
  Filled 2018-08-22: qty 2

## 2018-08-22 MED ORDER — HEPARIN SODIUM (PORCINE) 1000 UNIT/ML DIALYSIS
20.0000 [IU]/kg | INTRAMUSCULAR | Status: DC | PRN
  Administered 2018-08-22: 1200 [IU] via INTRAVENOUS_CENTRAL

## 2018-08-22 MED ORDER — AMLODIPINE BESYLATE 5 MG PO TABS: 5.0000 mg | ORAL_TABLET | Freq: Every day | ORAL | 0 refills | Status: DC

## 2018-08-22 MED ORDER — DARBEPOETIN ALFA 200 MCG/0.4ML IJ SOSY
PREFILLED_SYRINGE | INTRAMUSCULAR | Status: AC
  Administered 2018-08-22: 200 ug via SUBCUTANEOUS
  Filled 2018-08-22: qty 0.4

## 2018-08-22 MED ORDER — HYDROMORPHONE HCL 1 MG/ML IJ SOLN
INTRAMUSCULAR | Status: AC
  Administered 2018-08-22: 0.5 mg via INTRAVENOUS
  Filled 2018-08-22: qty 0.5

## 2018-08-22 MED ORDER — GABAPENTIN 300 MG PO CAPS: 300.0000 mg | ORAL_CAPSULE | Freq: Every day | ORAL | 0 refills | Status: DC

## 2018-08-22 MED ORDER — SODIUM CHLORIDE 0.9% IV SOLUTION: Freq: Once | INTRAVENOUS | Status: DC

## 2018-08-22 MED ORDER — PANCRELIPASE (LIP-PROT-AMYL) 24000-76000 UNITS PO CPEP: 24000.0000 [IU] | ORAL_CAPSULE | Freq: Three times a day (TID) | ORAL | 0 refills | Status: DC

## 2018-08-22 NOTE — Discharge Summary (Signed)
Physician Discharge Summary  [name] ACZ:[id number] DOB: Oct 01, 1950 DOA: 08/14/2018  PCP: Bernerd Limbo, MD  Admit date: 08/14/2018 Discharge date: 08/22/2018  Time spent: 45 minutes  Recommendations for Outpatient Follow-up:  Patient will be discharged to home.  Patient will need to follow up with primary care provider within one week of discharge, repeat CBC and BMP. Continue hemodialysis as scheduled. Patient should continue medications as prescribed.  Patient should follow a renal/carb modified  diet.   Discharge Diagnoses:  Acute on chronic kidney disease, stage V with pulmonary edema and volume overload-progressed to ESRD Severe hypokalemia, hypomagnesemia Recent acute pancreatitis/Abdominal pain  Anemia of chronic kidney disease Coronary artery disease Diabetes mellitus, type II COPD with chronic hypoxic respiratory failure Essential hypertension Chronic diastolic dysfunction  Discharge Condition: Stable   Diet recommendation: renal/carb modified  Filed Weights   08/20/18 2117 08/21/18 2146 08/22/18 0710  Weight: 58.3 kg 58.4 kg 58.2 kg    History of present illness:  on 08/15/2018 by Dr. Lossie Faes Bogeris a 67 y.o.femalewithhistory of CAD, abdominal aortic aneurysm status post repair, recently admitted for pancreatitis following which patient was again admitted with renal failure requiring dialysis and eventually was discharged home after doing well and was off dialysis and followed up with her primary care physician yesterday and was found to have increasing peripheral edema for which patient was given Lasix 120 mg. Labs revealed low potassium and was advised to come to the ER. Patient otherwise denies any chest pain shortness of breath nausea vomiting or diarrhea.  Hospital Course:  Acute on chronic kidney disease, stage V with pulmonary edema and volume overload- progressed to ESRD -Nephrology consulted and appreciated -Hemodialysis started  on 08/15/2018; HD on 12/12 -Clip process started and pending insurance auth -Nephrology, Dr. Moshe Cipro recommended discharging patient and having her dialyze as an outpatient on 08/24/2018 (eventhough insurance auth still pending- feels that by Thursday, insurance will approve)  Severe hypokalemia, hypomagnesemia -Potassium 4.1, Magnesium 2.1  Recent acute pancreatitis/Abdominal pain  -Patient with back and flank pain which states she has been ongoing for several weeks -Lipase 198 -LFTs WNL -Continue Creon -ordered KUB, reviewed and shows nonobstructive gas pattern -Abd [place] ordered: Hypoechoic masslike region measuring 4.8 x 3.8 x 5 cm inferior to the pancreatic head.  This could represent hematoma or biloma.  Small gallstone in the gallbladder stump, status post cholecystectomy.  Common bile duct dilatation. -Discussed ultrasound findings with patient, she states that she has been following up with gastroenterology as an outpatient. -Reviewed CT scan of the abdomen pelvis from 08/01/2018, slightly hyperdense complex process is bending over 8 cm in of the pancreatic head.  6 mm stone in the gallbladder stump -Patient feels her pain is now well controlled with both oral and IV pain medications -Experienced further pain for soft diet, scaled back to clear liquids today.  Anemia of chronic kidney disease -hemoglobin baseline between 8 and 9 -hemoglobin 7.3 this morning- given 1uPRBC -Continue Aranesp, ferric gluconate -repeat CBC in one week  Coronary artery disease -Currently chest pain-free -Continue Plavix, statin, Coreg  Diabetes mellitus, type II -not on any home medications -appears to be diet controlled -CBGs have been controlled -A1c 5.3 on 05/29/2018  COPD with chronic hypoxic respiratory failure -Patient uses 2 L of home oxygen at night -Currently stable  Essential hypertension -BP currently stable.  Continue  Coreg, amlodipine, hydralazine  Chronic diastolic  dysfunction -Echocardiogram 05/30/2018 shows an EF of 60 to 10%, grade 2 diastolic dysfunction -Continue  volume control with hemodialysis  Consultants Nephrology   Procedures  HD Renal US  Discharge Exam: Vitals:   08/22/18 1115 08/22/18 1344  BP: (!) 155/57 (!) 156/68  Pulse: 67 71  Resp: 18 18  Temp: 98.8 F (37.1 C) 98.4 F (36.9 C)  SpO2: 98% 100%   Feels abdominal pain has improved.  Denies chest pain, shortness of breath, dizziness or headache.  Wanting to go home.   General: Well developed, well nourished, NAD, appears stated age  HEENT: NCAT, mucous membranes moist.  Neck: Supple  Cardiovascular: S1 S2 auscultated, soft SEM, RRR  Respiratory: Clear to auscultation bilaterally with equal chest rise  Abdomen: Soft, nontender, nondistended, + bowel sounds  Extremities: warm dry without cyanosis clubbing. LE edema B/L  Neuro: AAOx3, nonfocal  Psych: Normal affect and demeanor   Discharge Instructions Discharge Instructions    Discharge instructions   Complete by:  As directed    Patient will be discharged to home.  Patient will need to follow up with primary care provider within one week of discharge, repeat CBC and BMP. Continue hemodialysis as scheduled. Patient should continue medications as prescribed.  Patient should follow a renal/carb modified  diet.     Allergies as of 08/22/2018      Reactions   Isosorbide Other (See Comments)   Can only tolerate in low doses   Isosorbide Nitrate Other (See Comments)   Can only tolerate in low doses   Oxycodone-acetaminophen Itching   Tolerates Hydrocodone.  Also tolerated oxycodone 07/13/18 admission.     Codeine Itching   Contrast Media [iodinated Diagnostic Agents] Itching, Other (See Comments)   Itching of feet   Ioxaglate Itching, Other (See Comments)   Itching of feet   Metrizamide Itching, Other (See Comments)   Itching of feet   Percocet [oxycodone-acetaminophen] Itching, Other (See Comments)    Tolerates Hydrocodone      Medication List    STOP taking these medications   furosemide 40 MG tablet Commonly known as:  LASIX   lipase/protease/amylase 12000 units Cpep capsule Commonly known as:  CREON Replaced by:  Pancrelipase (Lip-Prot-Amyl) 24000-76000 units Cpep   Vitamin D3 125 MCG (5000 UT) Tabs     TAKE these medications   acetaminophen 325 MG tablet Commonly known as:  TYLENOL Take 325 mg by mouth every 6 (six) hours as needed (for pain).   albuterol 108 (90 Base) MCG/ACT inhaler Commonly known as:  PROVENTIL HFA;VENTOLIN HFA Inhale 2 puffs into the lungs every 4 (four) hours as needed for wheezing or shortness of breath. ProAir   allopurinol 100 MG tablet Commonly known as:  ZYLOPRIM Take 100 mg by mouth daily.   amLODipine 5 MG tablet Commonly known as:  NORVASC Take 1 tablet (5 mg total) by mouth daily. Start taking on:  August 23, 2018 What changed:    medication strength  how much to take   atorvastatin 40 MG tablet Commonly known as:  LIPITOR Take 40 mg by mouth daily.   budesonide-formoterol 160-4.5 MCG/ACT inhaler Commonly known as:  SYMBICORT Inhale 2 puffs into the lungs 2 (two) times daily as needed (for flares).   carvedilol 12.5 MG tablet Commonly known as:  COREG Take 1 tablet (12.5 mg total) by mouth 2 (two) times daily with a meal.   clopidogrel 75 MG tablet Commonly known as:  PLAVIX Take 1 tablet (75 mg total) by mouth daily with breakfast.   feeding supplement (PRO-STAT SUGAR FREE 64) Liqd Take 30 mLs by  mouth 3 (three) times daily with meals.   feeding supplement Liqd Commonly known as:  BOOST / RESOURCE BREEZE Take 1 Container by mouth 2 (two) times daily.   fenofibrate 160 MG tablet Take 1 tablet (160 mg total) by mouth daily.   ferrous sulfate 325 (65 FE) MG tablet Take 1 tablet (325 mg total) by mouth daily with breakfast.   gabapentin 300 MG capsule Commonly known as:  NEURONTIN Take 1 capsule (300 mg total)  by mouth at bedtime. What changed:    when to take this  additional instructions   hydrALAZINE 25 MG tablet Commonly known as:  APRESOLINE Take 1 tablet (25 mg total) by mouth every 8 (eight) hours.   HYDROmorphone 2 MG tablet Commonly known as:  DILAUDID Take 2 mg by mouth every 4 (four) hours as needed for severe pain.   loperamide 2 MG tablet Commonly known as:  IMODIUM A-D Take 2 mg by mouth 4 (four) times daily as needed for diarrhea or loose stools.   NARCAN 4 MG/0.1ML Liqd nasal spray kit Generic drug:  naloxone Place 1 spray into the nose once.   Pancrelipase (Lip-Prot-Amyl) 24000-76000 units Cpep Take 1 capsule (24,000 Units total) by mouth 3 (three) times daily with meals. Replaces:  lipase/protease/amylase 12000 units Cpep capsule   pantoprazole 40 MG tablet Commonly known as:  PROTONIX Take 1 tablet (40 mg total) by mouth daily.   polyethylene glycol packet Commonly known as:  MIRALAX / GLYCOLAX Take 17 g by mouth daily as needed for mild constipation.   Vitamin B-12 5000 MCG Subl Place 5,000 mcg under the tongue daily. What changed:  Another medication with the same name was removed. Continue taking this medication, and follow the directions you see here.      Allergies  Allergen Reactions  . Isosorbide Other (See Comments)    Can only tolerate in low doses  . Isosorbide Nitrate Other (See Comments)    Can only tolerate in low doses  . Oxycodone-Acetaminophen Itching    Tolerates Hydrocodone.  Also tolerated oxycodone 07/13/18 admission.    . Codeine Itching  . Contrast Media [Iodinated Diagnostic Agents] Itching and Other (See Comments)    Itching of feet  . Ioxaglate Itching and Other (See Comments)    Itching of feet  . Metrizamide Itching and Other (See Comments)    Itching of feet  . Percocet [Oxycodone-Acetaminophen] Itching and Other (See Comments)    Tolerates Hydrocodone   Follow-up Information    Bernerd Limbo, MD. Schedule an  appointment as soon as possible for a visit in 1 week(s).   Specialty:  Family Medicine Why:  Hospital follow up Contact information: [place] [address] [phone]        Sherren Mocha, MD .   Specialty:  Cardiology Contact information: [address] [place] [address] [phone]        Mansouraty, Telford Nab., MD. Schedule an appointment as soon as possible for a visit in 1 week(s).   Specialties:  Gastroenterology, Internal Medicine Why:  Hospital follow up Contact information: [place] [address] [phone]            The results of significant diagnostics from this hospitalization (including imaging, microbiology, ancillary and laboratory) are listed below for reference.    Significant Diagnostic Studies: Ct Abdomen Pelvis Wo Contrast  Result Date: 08/01/2018 CLINICAL DATA:  67 year old female with recurrent pancreatitis.  History of pseudocyst. Abdominal pain and tenderness. Post cholecystectomy 2 and half months ago. Subsequent encounter. EXAM: CT ABDOMEN AND PELVIS WITHOUT CONTRAST TECHNIQUE: Multidetector CT imaging of the abdomen and pelvis was performed following the standard protocol without IV contrast. COMPARISON:  07/12/2018 hepatobiliary study. 07/10/2018 CT and MR. 05/28/2018 CT FINDINGS: Lower chest: Interval clearing of previously noted left lower lobe consolidation. Septal thickening and small pleural effusions greater on right may reflect changes of mild fluid overload given the diffuse third spacing of fluid. Heart incompletely assessed. Coronary artery calcifications noted. Hepatobiliary: Enlarged liver spanning over 19.6 cm. Taking into account limitation by non contrast imaging, no focal worrisome hepatic lesion. Post cholecystectomy. 6 mm stone in gallbladder stump. Perihepatic fluid. Prior nuclear medicine study did not reveal postoperative bile leak.  Pancreas: Slightly hyperdense complex process spanning over 8 cm centered at the pancreatic head and duodenal sweep has not improved. This is causing significant narrowing and thickening of the duodenum. Spleen: Taking into account limitation by non contrast imaging, no splenic mass or enlargement. Adrenals/Urinary Tract: Bilateral nonobstructing renal calculi/vascular calcifications. Slightly lobulated contour of the kidneys. No obvious mass noted on this unenhanced exam. No adrenal lesion. Noncontrast filled imaging of the urinary bladder unremarkable. Stomach/Bowel: Under distended stomach. Small hiatal hernia. Markedly abnormal appearance of the duodenum as noted above. Postsurgical changes rectosigmoid region. Hepatic flexure of colon surrounded by fluid but does not appear to be the primary source. Suspect prior appendectomy. Vascular/Lymphatic: Atherosclerotic changes aorta. Post endoluminal stenting. Native aortic aneurysm measures up to 4.9 cm without change. Renal artery stent. Reactive lymph nodes upper abdomen. Reproductive: Post hysterectomy.  No worrisome adnexal mass. Other: Prominent third spacing of fluid. Complex fluid collection adjacent to the lateral border of the left psoas muscle without change. This measures up to 5 cm. Musculoskeletal: Scoliosis with superimposed degenerative changes without acute osseous abnormality. IMPRESSION: 1. Slightly hyperdense complex process spanning over 8 cm centered at the pancreatic head and duodenal sweep has not improved. This is causing significant narrowing and thickening of the duodenum. It is possible this is a result of hemorrhagic pancreatitis given the hyperdense appearance. This does not have typical appearance of a pancreatic pseudocyst. Hematoma, infection or biloma are other considerations. 2. Interval clearing of previously noted left lower lobe consolidation. Septal thickening and small pleural effusions greater on right may reflect changes of  mild fluid overload given the diffuse third spacing of fluid. 3. Post cholecystectomy. 6 mm stone in gallbladder stump. 4. Atherosclerotic changes aorta. Post endoluminal stenting. Native aortic aneurysm measures up to 4.9 cm without change. 5. Complex fluid collection adjacent to the lateral border of the left psoas muscle without change. This measures up to 5 cm. Electronically Signed   By: Genia Del M.D.   On: 08/01/2018 17:34   Dg Chest 2 View  Result Date: 08/14/2018 CLINICAL DATA:  Initial evaluation for market leg swelling. EXAM: CHEST - 2 VIEW COMPARISON:  Prior radiograph from 07/24/2018. FINDINGS: Median sternotomy wires underlying CABG markers, valvular prosthesis, and left atrial appendage clip noted. Vascular stent at the upper left mediastinum. Severe cardiomegaly, stable. Mediastinal silhouette within normal limits. Aortic atherosclerosis. Lungs mildly hypoinflated. Diffuse vascular congestion with interstitial prominence compatible with diffuse pulmonary interstitial edema. Superimposed mild bibasilar subsegmental atelectasis. No consolidative opacity. No pleural effusion. No pneumothorax. No acute osseous abnormality. IMPRESSION: 1. Cardiomegaly with mild diffuse pulmonary interstitial edema. 2. Sequelae of prior CABG. 3. Aortic atherosclerosis. Electronically Signed   By: Pincus Badder.D.  On: 08/14/2018 22:26   Dg Abd 1 View  Result Date: 08/18/2018 CLINICAL DATA:  Chronic kidney disease and pulmonary edema. EXAM: ABDOMEN - 1 VIEW COMPARISON:  07/08/2018 FINDINGS: Bowel gas pattern is nonobstructive. No evidence of free peritoneal air. Surgical clips over the right upper quadrant. Aortoiliac stent graft unchanged. Surgical sutures over the pelvis. Calcified plaque over the aortoiliac vessels. Remainder of the exam is unchanged. IMPRESSION: Nonobstructive bowel gas pattern. Electronically Signed   By: Marin Olp M.D.   On: 08/18/2018 10:20   Ct Head Wo Contrast  Result  Date: 07/24/2018 CLINICAL DATA:  Altered mental status, somnolent. History of anemia, diabetes, pancreatitis. EXAM: CT HEAD WITHOUT CONTRAST TECHNIQUE: Contiguous axial images were obtained from the base of the skull through the vertex without intravenous contrast. COMPARISON:  CT HEAD June 30, 2018 FINDINGS: BRAIN: No intraparenchymal hemorrhage, mass effect nor midline shift. Mild parenchymal brain volume loss. Patchy supratentorial white matter hypodensities within normal range for patient's age, though non-specific are most compatible with chronic small vessel ischemic disease. No acute large vascular territory infarcts. No abnormal extra-axial fluid collections. Basal cisterns are patent. VASCULAR: Severe calcific atherosclerosis of the carotid siphons. SKULL: No skull fracture. No significant scalp soft tissue swelling. SINUSES/ORBITS: Trace paranasal sinus mucosal thickening. Mastoid air cells are well aerated.The included ocular globes and orbital contents are non-suspicious. OTHER: None. IMPRESSION: 1. No acute intracranial process. 2. Mild parenchymal brain volume loss. 3. Mild chronic small vessel ischemic changes. Severe intracranial atherosclerosis. Electronically Signed   By: Elon Alas M.D.   On: 07/24/2018 20:33   US Abdomen Complete  Result Date: 08/18/2018 CLINICAL DATA:  Abdominal pain for a month. Cholecystectomy. Pancreatitis. EXAM: ABDOMEN ULTRASOUND COMPLETE COMPARISON:  CT scans July 10, 2018 and August 01, 2018 FINDINGS: Gallbladder: Patient is status post cholecystectomy. A stone is again identified in the gallbladder stump measuring 9 mm today versus 6 mm on the recent CT scan. The difference in size is probably technical. Common bile duct: Diameter: 8 mm Liver: No focal mass. The liver is enlarged to 20 cm. Portal vein is patent on color Doppler imaging with normal direction of blood flow towards the liver. IVC: No abnormality visualized. Pancreas: A complex  hypoechoic region is seen inferior to the pancreatic head measuring 4.8 x 3.8 x 5.0 cm. Spleen: Size and appearance within normal limits. Right Kidney: Length: 10.7 cm.  Increased cortical echogenicity. Left Kidney: Length: 10.2 cm.  Increased cortical echogenicity. Abdominal aorta: No aneurysm visualized. Other findings: None. IMPRESSION: 1. There is a hypoechoic masslike region measuring 4.8 x 3.8 x 5.0 cm inferior to the pancreatic head. Given previous CT imaging, this could represent hematoma or biloma. Infection is considered less likely as is a neoplasm. Recommend attention on follow-up. 2. Small stone in the gallbladder stump. The patient is status post cholecystectomy. 3. Mild ascites. 4. Common bile duct dilatation may simply be due to previous cholecystectomy. Recommend correlation with labs. 5. Hepatomegaly. 6. Increased echogenicity in the renal cortices consistent with medical renal disease. Electronically Signed   By: Dorise Bullion III M.D   On: 08/18/2018 16:02   Dg Chest Portable 1 View  Result Date: 07/24/2018 CLINICAL DATA:  Altered level of consciousness. EXAM: PORTABLE CHEST 1 VIEW COMPARISON:  07/08/2018 FINDINGS: Previous median sternotomy. Aortic atherosclerosis. Mild cardiac enlargement. Moderate diffuse pulmonary edema is similar to previous exam. No focal airspace opacities. IMPRESSION: 1. Cardiac enlargement and pulmonary edema. 2.  Aortic Atherosclerosis (ICD10-I70.0). Electronically Signed   By: Lovena Le  Clovis Riley M.D.   On: 07/24/2018 20:38   Dg Foot Complete Left  Result Date: 07/30/2018 CLINICAL DATA:  Lethargy.  Right foot swelling. EXAM: LEFT FOOT - COMPLETE 3+ VIEW COMPARISON:  MRI of the right foot dated 03/27/2016 FINDINGS: Cortical discontinuity in the distal metaphysis of the proximal phalanx small toe suspicious for fracture, not present on 03/19/2016. Small marginal erosion along the base of the proximal phalanx third toe, likewise new compared to the prior exam. No  malalignment at the Lisfranc joint. There is some dorsal periosteal elevation in the vicinity of the base of the proximal first metatarsal as suggested on the lateral projection. Notable soft tissue swelling along the distal dorsum of the foot. Plantar calcaneal spur.  Vascular calcification noted. IMPRESSION: 1. Suspected nondisplaced fracture of the distal metaphysis proximal phalanx small toe. 2. Small marginal erosion along the base of the proximal phalanx third toe, potentially from gout or other erosive arthropathy. 3. Notable soft tissue swelling along the distal dorsum of the foot. 4. Dorsal periosteal elevation in the vicinity of the base of the proximal first metatarsal on the lateral projection, mild periostitis is not excluded. Electronically Signed   By: Van Clines M.D.   On: 07/30/2018 18:57   Dg Foot Complete Right  Result Date: 07/30/2018 CLINICAL DATA:  Left foot pain EXAM: RIGHT FOOT COMPLETE - 3+ VIEW COMPARISON:  None. FINDINGS: The toes are flexed on imaging which reduces sensitivity. No malalignment at the Lisfranc joint. Small erosion along the distal first metatarsal head medially, best seen on the oblique projection. Mild dorsal soft tissue swelling along the forefoot. Plantar calcaneal spur noted. IMPRESSION: 1. Mild dorsal soft tissue swelling along the forefoot. 2. Small erosion along the first metatarsal head, possibly a manifestation of gout arthropathy. Electronically Signed   By: Van Clines M.D.   On: 07/30/2018 18:59    Microbiology: No results found for this or any previous visit (from the past 240 hour(s)).   Labs: Basic Metabolic Panel: Recent Labs  Lab 08/16/18 0622  08/17/18 1500 08/18/18 0447 08/19/18 0447 08/21/18 0356 08/21/18 1654 08/22/18 0438 08/22/18 0710  NA 137   < >  --  141 141 135 136  --  135  K 2.7*   < >  --  3.4* 4.8 3.9 4.0  --  4.1  CL 102   < >  --  102 104 99 100  --  98  CO2 20*   < >  --  _0 --  25    GLUCOSE 136*   < >  --  103* 103* 117* 88  --  117*  BUN 84*   < >  --  39* 48* 35* 40*  --  41*  CREATININE 2.83*   < >  --  1.77* 2.22* 2.11* 2.32*  --  2.49*  CALCIUM 7.7*   < >  --  8.3* 8.5* 8.7* 8.6*  --  9.0  MG 1.4*  --  2.1  --   --  1.6*  --  2.1  --   PHOS  --   --   --  2.7  --  3.4 3.6  --  4.2   < > = values in this interval not displayed.   Liver Function Tests: Recent Labs  Lab 08/17/18 0339 08/18/18 0447 08/19/18 0447 08/21/18 0356 08/21/18 1654 08/22/18 0710  AST 17  --  20  --   --   --  ALT 7  --  7  --   --   --   ALKPHOS 33*  --  32*  --   --   --   BILITOT 0.8  --  0.6  --   --   --   PROT 5.5*  --  5.4*  --   --   --   ALBUMIN 2.5* 2.4* 2.5* 2.6* 2.7* 2.8*   Recent Labs  Lab 08/17/18 0339 08/19/18 0447  LIPASE 267* 198*   No results for input(s): AMMONIA in the last 168 hours. CBC: Recent Labs  Lab 08/17/18 1327 08/18/18 0447 08/19/18 0447 08/20/18 1039 08/22/18 0438  WBC 7.9 6.8  --   --   --   HGB 8.1* 8.0* 8.3* 8.2* 7.3*  HCT 26.3* 25.9* 28.3* 27.5* 24.5*  MCV 90.4 90.9  --   --   --   PLT 290 258  --   --   --    Cardiac Enzymes: No results for input(s): CKTOTAL, CKMB, CKMBINDEX, TROPONINI in the last 168 hours. BNP: BNP (last 3 results) Recent Labs    04/18/18 0501 05/28/18 2325 08/14/18 2201  BNP 1,099.7* 2,077.1* 2,774.6*    ProBNP (last 3 results) No results for input(s): PROBNP in the last 8760 hours.  CBG: Recent Labs  Lab 08/21/18 1639 08/21/18 1801 08/21/18 2145 08/22/18 0946 08/22/18 1139  GLUCAP 59* 100* 109* 100* 132*       Signed:  Mikaeel Petrow  [facility] 08/22/2018, 1:57 PM

## 2018-08-22 NOTE — Progress Notes (Signed)
Linden Dolin Rutigliano to be D/C'd Home per MD order.  Discussed prescriptions and follow up appointments with the patient. Prescriptions given to patient, medication list explained in detail. Pt verbalized understanding.  Allergies as of 08/22/2018      Reactions   Isosorbide Other (See Comments)   Can only tolerate in low doses   Isosorbide Nitrate Other (See Comments)   Can only tolerate in low doses   Oxycodone-acetaminophen Itching   Tolerates Hydrocodone.  Also tolerated oxycodone 07/13/18 admission.     Codeine Itching   Contrast Media [iodinated Diagnostic Agents] Itching, Other (See Comments)   Itching of feet   Ioxaglate Itching, Other (See Comments)   Itching of feet   Metrizamide Itching, Other (See Comments)   Itching of feet   Percocet [oxycodone-acetaminophen] Itching, Other (See Comments)   Tolerates Hydrocodone      Medication List    STOP taking these medications   furosemide 40 MG tablet Commonly known as:  LASIX   lipase/protease/amylase 12000 units Cpep capsule Commonly known as:  CREON Replaced by:  Pancrelipase (Lip-Prot-Amyl) 24000-76000 units Cpep   Vitamin D3 125 MCG (5000 UT) Tabs     TAKE these medications   acetaminophen 325 MG tablet Commonly known as:  TYLENOL Take 325 mg by mouth every 6 (six) hours as needed (for pain).   albuterol 108 (90 Base) MCG/ACT inhaler Commonly known as:  PROVENTIL HFA;VENTOLIN HFA Inhale 2 puffs into the lungs every 4 (four) hours as needed for wheezing or shortness of breath. ProAir   allopurinol 100 MG tablet Commonly known as:  ZYLOPRIM Take 100 mg by mouth daily.   amLODipine 5 MG tablet Commonly known as:  NORVASC Take 1 tablet (5 mg total) by mouth daily. Start taking on:  August 23, 2018 What changed:    medication strength  how much to take   atorvastatin 40 MG tablet Commonly known as:  LIPITOR Take 40 mg by mouth daily.   budesonide-formoterol 160-4.5 MCG/ACT inhaler Commonly known as:   SYMBICORT Inhale 2 puffs into the lungs 2 (two) times daily as needed (for flares).   carvedilol 12.5 MG tablet Commonly known as:  COREG Take 1 tablet (12.5 mg total) by mouth 2 (two) times daily with a meal.   clopidogrel 75 MG tablet Commonly known as:  PLAVIX Take 1 tablet (75 mg total) by mouth daily with breakfast.   feeding supplement (PRO-STAT SUGAR FREE 64) Liqd Take 30 mLs by mouth 3 (three) times daily with meals.   feeding supplement Liqd Commonly known as:  BOOST / RESOURCE BREEZE Take 1 Container by mouth 2 (two) times daily.   fenofibrate 160 MG tablet Take 1 tablet (160 mg total) by mouth daily.   ferrous sulfate 325 (65 FE) MG tablet Take 1 tablet (325 mg total) by mouth daily with breakfast.   gabapentin 300 MG capsule Commonly known as:  NEURONTIN Take 1 capsule (300 mg total) by mouth at bedtime. What changed:    when to take this  additional instructions   hydrALAZINE 25 MG tablet Commonly known as:  APRESOLINE Take 1 tablet (25 mg total) by mouth every 8 (eight) hours.   HYDROmorphone 2 MG tablet Commonly known as:  DILAUDID Take 2 mg by mouth every 4 (four) hours as needed for severe pain.   loperamide 2 MG tablet Commonly known as:  IMODIUM A-D Take 2 mg by mouth 4 (four) times daily as needed for diarrhea or loose stools.   NARCAN 4  MG/0.1ML Liqd nasal spray kit Generic drug:  naloxone Place 1 spray into the nose once.   Pancrelipase (Lip-Prot-Amyl) 24000-76000 units Cpep Take 1 capsule (24,000 Units total) by mouth 3 (three) times daily with meals. Replaces:  lipase/protease/amylase 12000 units Cpep capsule   pantoprazole 40 MG tablet Commonly known as:  PROTONIX Take 1 tablet (40 mg total) by mouth daily.   polyethylene glycol packet Commonly known as:  MIRALAX / GLYCOLAX Take 17 g by mouth daily as needed for mild constipation.   Vitamin B-12 5000 MCG Subl Place 5,000 mcg under the tongue daily. What changed:  Another  medication with the same name was removed. Continue taking this medication, and follow the directions you see here.       Vitals:   08/22/18 1359 08/22/18 1542  BP: (!) 156/68 (!) 151/67  Pulse: 71 66  Resp: 18 18  Temp: 98.4 F (36.9 C) 98.4 F (36.9 C)  SpO2: 100% 100%    IV catheter discontinued intact. Site without signs and symptoms of complications. Dressing and pressure applied. Pt denies pain at this time. No complaints noted.  An After Visit Summary was printed and given to the patient. Patient escorted via [place], and D/C home via private auto.  Orville Govern, RN

## 2018-08-22 NOTE — Care Management Note (Signed)
Case Management Note  Patient Details  Name: NAKAYA MISHKIN MRN: [id number] Date of Birth: 03-Apr-1951  Subjective/Objective:  Pt presented for Acute on Chronic Renal Failure-now ESRD. PTA from home with family support. Pt new start to HD-per nephrology has spot at [place]. Unaware of chair time 2/2 patient has not been financially cleared.                    Action/Plan: CM did discuss this with the patient and hopefully she will hear from the [facility] tomorrow for HD Thursday. Pt is ready to transition home. No further needs from CM at this time.   Expected Discharge Date:  08/22/18               Expected Discharge Plan:  [place]  In-House Referral:  NA  Discharge planning Services  CM Consult  Post Acute Care Choice:  NA Choice offered to:  NA  DME Arranged:  N/A DME Agency:  NA  HH Arranged:  NA HH Agency:  NA  Status of Service:  Completed, signed off  If discussed at [place] of Stay Meetings, dates discussed:    Additional Comments:  Bethena Roys, RN 08/22/2018, 4:03 PM

## 2018-08-22 NOTE — Discharge Instructions (Signed)
End-Stage Kidney Disease End-stage kidney disease occurs when the kidneys are so damaged that they cannot do their job. The kidneys are two organs that do many important jobs in the body, which include:  Removing wastes and extra fluids from the blood.  Making hormones that maintain the amount of fluid in your tissues and blood vessels.  Maintaining the right amount of fluids and chemicals in the body.  When the kidneys are damaged and cannot do their job, life-threatening problems occur. Without the help of the kidneys, toxins build up in the blood. In end-stage kidney disease, the kidneys cannot get better. What are the causes? End-stage kidney disease usually occurs when a long-lasting (chronic) kidney disease gets worse. It may also occur after the kidneys are suddenly damaged (acute kidney injury). What increases the risk? This condition is more likely to develop in people who are:  Older than age 63.  Female.  Of African-American descent.  Current smokers or former smokers.  Obese.  You may also have an increased risk for end-stage kidney disease if you:  Have a family history of chronic kidney disease (CKD).  Have had kidney disease for many years.  Have other longstanding medical conditions that affect the kidneys, such as: ? Cardiovascular disease including high blood pressure. ? Diabetes. ? Certain diseases that affect the immune system.  What are the signs or symptoms?  Swelling (edema) of the face, legs, ankles, or feet.  Numbness, tingling, or loss of feeling (sensation) in your hands or feet.  Tiredness (lethargy).  Nausea or vomiting.  Confusion, trouble concentrating, or loss of consciousness.  Chest pain.  Shortness of breath.  Little to no urine production.  Muscle twitches and cramps, especially in the legs.  Constant itchiness.  Loss of appetite.  Pale skin and tissue lining your eyelids (conjunctiva).  Headaches.  Abnormally dark or  light skin.  Decrease in muscle size (muscle wasting).  Easy bruising.  Frequent hiccups.  Stopping of menstruation in women.  Seizures. How is this diagnosed? Your health care provider will measure your blood pressure and do some tests. These may include:  Urine tests.  Blood tests.  Imaging tests.  A test in which a sample of tissue is removed from the kidneys to be looked at under a microscope (kidney biopsy).  How is this treated? There are two treatments for end-stage kidney disease:  A procedure that removes toxic wastes from the body (dialysis). Depending on the type of dialysis you choose, it may be performed more than one time a day (peritoneal dialysis) or several times a week (hemodialysis).  Surgery toreceive a new kidney (kidney transplant).  In addition to having dialysis or a kidney transplant, you may need to take medicines:  To control high blood pressure (hypertension).  To control cholesterol.  To maintain healthy electrolyte levels in your blood.  You may also be given a specific diet to follow that includes requirements or limits for:  Salt (sodium).  Protein.  Phosphorous.  Potassium.  Calcium.  Follow these instructions at home:  Follow your prescribed diet.  Take over-the-counter and prescription medicines only as told by your health care provider. ? Do not take any new medicines unless approved by your health care provider. Many medicines can worsen your kidney damage. ? Do not take any vitamin and mineral supplements unless approved by your health care provider. Many nutritional supplements can worsen your kidney damage. ? The dose of some medicines that you take may need to be  adjusted.  Do not use any tobacco products, such as cigarettes, chewing tobacco, and e-cigarettes. If you need help quitting, ask your health care provider.  Keep all follow-up visits as told by your health care provider. This is important.  Keep track of  your blood pressure. Report changes in your blood pressure as told by your health care provider.  Achieve and maintain a healthy weight. If you need help with this, ask your health care provider.  Start or continue an exercise plan. Try to exercise at least 30 minutes a day, 5 days a week.  Stay current with immunizations as told by your health care provider. Where to find more information:  American Association of Kidney Patients: BombTimer.gl  National Kidney Foundation: www.kidney.[place]: https://mathis.com/  Life [facility]: www.lifeoptions.org and www.kidneyschool.org Contact a health care provider if:  Your symptoms get worse.  You develop new symptoms. Get help right away if:  You have weakness in an arm or leg on one side of your body.  You have difficulty speaking or you are slurring your speech.  You have a sudden change in your vision.  You have a sudden, severe headache.  You have a sudden weight increase.  You have difficulty breathing.  Your symptoms suddenly get worse. This information is not intended to replace advice given to you by your health care provider. Make sure you discuss any questions you have with your health care provider. Document Released: 11/13/2003 Document Revised: 01/29/2016 Document Reviewed: 04/21/2012 Elsevier Interactive Patient Education  2017 [employer].

## 2018-08-22 NOTE — Procedures (Signed)
Patient was seen on dialysis and the procedure was supervised.  BFR 350  Via AVF BP is  142/58.   Patient appears to be tolerating treatment well  Louis Meckel 08/22/2018

## 2018-08-22 NOTE — Progress Notes (Signed)
Subjective:  No new c/o's - wants to go home.  Has spot TTS at [facility]   Objective Vital signs in last 24 hours: Vitals:   08/22/18 0719 08/22/18 0730 08/22/18 0800 08/22/18 0900  BP: (!) 148/63 (!) 133/57 138/64 (!) 142/63  Pulse: (!) 57 (!) 57 63 66  Resp:      Temp:      TempSrc:      SpO2:      Weight:      Height:       Weight change: 0.1 kg  Intake/Output Summary (Last 24 hours) at 08/22/2018 0929 Last data filed at 08/22/2018 0600 Gross per 24 hour  Intake 170 ml  Output 2 ml  Net 168 ml    Assessment/ Plan: Pt is a 67 y.o. yo female - advanced CKD as OP who was admitted on 08/14/2018 with volume overload, needed to initiate dialysis for ESRD this hospitalization Assessment/Plan: 1. Renal - new ESRD-  HD started on 12/10-  fourth treatment today- have been able to use AVF- for the most part it has gone well- her late husband passed away soon after starting dialysis 10 years ago so this is very emotional for her.  She tells me that she has been clipped to [place], some issue for insurance clearance but we think is because was labelled AKI- she is ESRD- anticipate will be cleared by Thursday so will discharge after HD today to go to [place] on Thursday 2. HTN/volume- still pretty overloaded, UF as able with HD- decrease amlodipine to give [place] cushion, cont coreg and hydralazine for now but with low threshold to dec/stop those as well   3. Anemia- hgb in the 8's- orders for iron and ESA- in the 7's today transfuse one unit  4. Secondary hyperparathyroidism- phos OK, no binder currently - PTH 80 - no vit D   [name]    Labs: Basic Metabolic Panel: Recent Labs  Lab 08/18/18 0447 08/19/18 0447 08/21/18 0356 08/21/18 1654  NA 141 141 135 136  K 3.4* 4.8 3.9 4.0  CL 102 104 99 100  CO2 27 27 24 26   GLUCOSE 103* 103* 117* 88  BUN 39* 48* 35* 40*  CREATININE 1.77* 2.22* 2.11* 2.32*  CALCIUM 8.3* 8.5* 8.7* 8.6*  PHOS 2.7  --  3.4 3.6   Liver Function  Tests: Recent Labs  Lab 08/17/18 0339  08/19/18 0447 08/21/18 0356 08/21/18 1654  AST 17  --  20  --   --   ALT 7  --  7  --   --   ALKPHOS 33*  --  32*  --   --   BILITOT 0.8  --  0.6  --   --   PROT 5.5*  --  5.4*  --   --   ALBUMIN 2.5*   < > 2.5* 2.6* 2.7*   < > = values in this interval not displayed.   Recent Labs  Lab 08/17/18 0339 08/19/18 0447  LIPASE 267* 198*   No results for input(s): AMMONIA in the last 168 hours. CBC: Recent Labs  Lab 08/17/18 1327 08/18/18 0447 08/19/18 0447 08/20/18 1039 08/22/18 0438  WBC 7.9 6.8  --   --   --   HGB 8.1* 8.0* 8.3* 8.2* 7.3*  HCT 26.3* 25.9* 28.3* 27.5* 24.5*  MCV 90.4 90.9  --   --   --   PLT 290 258  --   --   --  Cardiac Enzymes: No results for input(s): CKTOTAL, CKMB, CKMBINDEX, TROPONINI in the last 168 hours. CBG: Recent Labs  Lab 08/21/18 0737 08/21/18 1131 08/21/18 1639 08/21/18 1801 08/21/18 2145  GLUCAP 89 169* 59* 100* 109*    Iron Studies: No results for input(s): IRON, TIBC, TRANSFERRIN, FERRITIN in the last 72 hours. Studies/Results: No results found. Medications: Infusions: . ferric gluconate (FERRLECIT/NULECIT) IV Stopped (08/19/18 1819)    Scheduled Medications: . sodium chloride   Intravenous Once  . allopurinol  100 mg Oral Daily  . amLODipine  5 mg Oral Daily  . atorvastatin  40 mg Oral Daily  . carvedilol  12.5 mg Oral BID WC  . Chlorhexidine Gluconate Cloth  6 each Topical Q0600  . Chlorhexidine Gluconate Cloth  6 each Topical Q0600  . clopidogrel  75 mg Oral Q breakfast  . darbepoetin (ARANESP) injection - NON-DIALYSIS  200 mcg Subcutaneous Q Tue-1800  . feeding supplement  1 Container Oral BID BM  . feeding supplement (PRO-STAT SUGAR FREE 64)  30 mL Oral TID WC  . fenofibrate  160 mg Oral Daily  . gabapentin  300 mg Oral QHS  . heparin  5,000 Units Subcutaneous Q8H  . hydrALAZINE  25 mg Oral Q8H  . insulin aspart  0-9 Units Subcutaneous TID WC  .  lipase/protease/amylase  24,000 Units Oral TID WC  . mometasone-formoterol  2 puff Inhalation BID  . pantoprazole  40 mg Oral Daily  . vitamin B-12  1,000 mcg Oral Daily    have reviewed scheduled and prn medications.  Physical Exam: General: seen on HD- no c/o's but asking for IV pain med Heart:  RRR Lungs: dec BS at bases Abdomen: diffusely tender Extremities: pitting edema Dialysis Access: left upper AVF- bruised      08/22/2018,9:29 AM  LOS: 6 days

## 2018-08-23 LAB — BPAM RBC
Blood Product Expiration Date: 201912252359
ISSUE DATE / TIME: 201912171352
Unit Type and Rh: 7300

## 2018-08-23 LAB — TYPE AND SCREEN
ABO/RH(D): B POS
Antibody Screen: POSITIVE
Donor AG Type: NEGATIVE
Unit division: 0

## 2018-08-28 ENCOUNTER — Telehealth: Payer: Self-pay | Admitting: Gastroenterology

## 2018-08-28 NOTE — Telephone Encounter (Signed)
[name] it looks like you called this pt.

## 2018-08-28 NOTE — Telephone Encounter (Signed)
Spoke with patient today about scheduling Ct.  She states she is on dialysis now and wants to know if she can have the CT with contrast.  She would like to have this done prior to Jan 3rd because her daughter goes back to work.  Forwarded to Dr. Rush Landmark.  Please advise. Thanks, Peter Congo

## 2018-08-29 ENCOUNTER — Other Ambulatory Visit: Payer: Self-pay

## 2018-08-29 DIAGNOSIS — K859 Acute pancreatitis without necrosis or infection, unspecified: Secondary | ICD-10-CM

## 2018-08-29 DIAGNOSIS — R601 Generalized edema: Secondary | ICD-10-CM

## 2018-08-29 NOTE — Telephone Encounter (Signed)
Thank you for update. If she has transitioned to full time HD, then ideal to try and do IV contrast. Please proceed with changing to a CT-Abdomen/Pelvis with IV contrast. She should have this done a day prior to her next HD session. Thank you. GM

## 2018-08-31 NOTE — Telephone Encounter (Signed)
Patient wants to know is there a way she can do the CT without drinking the contrast. Please call pt and advise.

## 2018-08-31 NOTE — Telephone Encounter (Signed)
Patient informed that she doesn't have to do the oral contrast and she wrote down the date/time of appointment.

## 2018-08-31 NOTE — Telephone Encounter (Signed)
Please advise as Doc of the Day Sir, thank you.

## 2018-08-31 NOTE — Telephone Encounter (Signed)
Patient's CT scan is set up to be done at [facility] at [address] on December 30th at 2:00pm, arrive at 1:40pm. They are sending in her pre-meds to [place] for her IV dye allergy. They will come here for the oral contrast. I informed her daughter Almyra Free of the date/time. I will include the directions in with the contrast.

## 2018-08-31 NOTE — Telephone Encounter (Signed)
Okay to proceed with CT of the abdomen and pelvis with IV contrast only, okay to not use the oral contrast I believe that premedications are already ordered given her history of itching with IV dye previously; if not premedications will need to be ordered

## 2018-09-01 ENCOUNTER — Telehealth: Payer: Self-pay | Admitting: Gastroenterology

## 2018-09-01 NOTE — Telephone Encounter (Signed)
Spoke with [name] and gave her the phone number of GI [phone] to reschedule the CT scan. Pt verbalized understanding.

## 2018-09-01 NOTE — Telephone Encounter (Signed)
Pt's daughter [name] called to inform that ct scan scheduled for Monday needs to be rescheduled because pt has dialysis. Pls call [name] or pt to reschedule.

## 2018-09-04 ENCOUNTER — Inpatient Hospital Stay: Admission: RE | Admit: 2018-09-04 | Payer: Self-pay | Source: Ambulatory Visit

## 2018-09-05 ENCOUNTER — Telehealth: Payer: Self-pay | Admitting: Gastroenterology

## 2018-09-05 NOTE — Telephone Encounter (Signed)
The pt states she was told by [facility] that she can not have IV contrast.  I called and spoke with a tech at GI and was advised the pt can have IV only.  I returned a call to the pt and advised her that she can have the CT with IV contrast and will not be having oral contrast.  The pt has been advised of the information and verbalized understanding.

## 2018-09-07 ENCOUNTER — Telehealth: Payer: Self-pay | Admitting: Gastroenterology

## 2018-09-07 ENCOUNTER — Other Ambulatory Visit: Payer: Self-pay | Admitting: Gastroenterology

## 2018-09-07 MED ORDER — PREDNISONE 50 MG PO TABS: ORAL_TABLET | ORAL | 0 refills | Status: DC

## 2018-09-07 NOTE — Progress Notes (Signed)
pred 

## 2018-09-07 NOTE — Telephone Encounter (Signed)
Spoke to [facility] imaging who stated the nurse did not send in prednisone last week when the CT imaging was ordered. Sent in Prednisone 50 mg to [facility] for patient to take 13,7,1 hour before contrast along with Benadryl 50 mg 1 hour prior to contrast. Spoke to patient. Went over medication instructions, she voiced understanding.

## 2018-09-08 ENCOUNTER — Ambulatory Visit
Admission: RE | Admit: 2018-09-08 | Discharge: 2018-09-08 | Disposition: A | Discharge status: Home / Self Care | Payer: Managed Care, Other (non HMO) | Source: Ambulatory Visit | Attending: Gastroenterology | Admitting: Gastroenterology

## 2018-09-08 ENCOUNTER — Other Ambulatory Visit: Payer: Self-pay | Admitting: Gastroenterology

## 2018-09-08 DIAGNOSIS — K851 Biliary acute pancreatitis without necrosis or infection: Secondary | ICD-10-CM

## 2018-09-09 ENCOUNTER — Inpatient Hospital Stay (HOSPITAL_COMMUNITY)
Admission: EM | Admit: 2018-09-09 | Discharge: 2018-09-15 | DRG: 438 | Disposition: A | Discharge status: Home / Self Care | Payer: Medicare Other | Attending: Internal Medicine | Admitting: Internal Medicine

## 2018-09-09 ENCOUNTER — Emergency Department (HOSPITAL_COMMUNITY): Payer: Medicare Other

## 2018-09-09 ENCOUNTER — Encounter (HOSPITAL_COMMUNITY): Payer: Self-pay

## 2018-09-09 DIAGNOSIS — I739 Peripheral vascular disease, unspecified: Secondary | ICD-10-CM | POA: Diagnosis present

## 2018-09-09 DIAGNOSIS — Z7951 Long term (current) use of inhaled steroids: Secondary | ICD-10-CM

## 2018-09-09 DIAGNOSIS — F419 Anxiety disorder, unspecified: Secondary | ICD-10-CM | POA: Diagnosis present

## 2018-09-09 DIAGNOSIS — K859 Acute pancreatitis without necrosis or infection, unspecified: Secondary | ICD-10-CM | POA: Diagnosis present

## 2018-09-09 DIAGNOSIS — I132 Hypertensive heart and chronic kidney disease with heart failure and with stage 5 chronic kidney disease, or end stage renal disease: Secondary | ICD-10-CM | POA: Diagnosis present

## 2018-09-09 DIAGNOSIS — F329 Major depressive disorder, single episode, unspecified: Secondary | ICD-10-CM | POA: Diagnosis present

## 2018-09-09 DIAGNOSIS — I252 Old myocardial infarction: Secondary | ICD-10-CM

## 2018-09-09 DIAGNOSIS — J449 Chronic obstructive pulmonary disease, unspecified: Secondary | ICD-10-CM | POA: Diagnosis present

## 2018-09-09 DIAGNOSIS — M549 Dorsalgia, unspecified: Secondary | ICD-10-CM | POA: Diagnosis present

## 2018-09-09 DIAGNOSIS — I248 Other forms of acute ischemic heart disease: Secondary | ICD-10-CM | POA: Diagnosis present

## 2018-09-09 DIAGNOSIS — I5032 Chronic diastolic (congestive) heart failure: Secondary | ICD-10-CM | POA: Diagnosis present

## 2018-09-09 DIAGNOSIS — K219 Gastro-esophageal reflux disease without esophagitis: Secondary | ICD-10-CM | POA: Diagnosis present

## 2018-09-09 DIAGNOSIS — J9611 Chronic respiratory failure with hypoxia: Secondary | ICD-10-CM | POA: Diagnosis present

## 2018-09-09 DIAGNOSIS — E785 Hyperlipidemia, unspecified: Secondary | ICD-10-CM | POA: Diagnosis present

## 2018-09-09 DIAGNOSIS — E119 Type 2 diabetes mellitus without complications: Secondary | ICD-10-CM

## 2018-09-09 DIAGNOSIS — I493 Ventricular premature depolarization: Secondary | ICD-10-CM | POA: Diagnosis present

## 2018-09-09 DIAGNOSIS — I10 Essential (primary) hypertension: Secondary | ICD-10-CM | POA: Diagnosis present

## 2018-09-09 DIAGNOSIS — Z8249 Family history of ischemic heart disease and other diseases of the circulatory system: Secondary | ICD-10-CM

## 2018-09-09 DIAGNOSIS — E1122 Type 2 diabetes mellitus with diabetic chronic kidney disease: Secondary | ICD-10-CM | POA: Diagnosis present

## 2018-09-09 DIAGNOSIS — Z66 Do not resuscitate: Secondary | ICD-10-CM | POA: Diagnosis present

## 2018-09-09 DIAGNOSIS — F17210 Nicotine dependence, cigarettes, uncomplicated: Secondary | ICD-10-CM | POA: Diagnosis present

## 2018-09-09 DIAGNOSIS — Z9981 Dependence on supplemental oxygen: Secondary | ICD-10-CM

## 2018-09-09 DIAGNOSIS — Z833 Family history of diabetes mellitus: Secondary | ICD-10-CM

## 2018-09-09 DIAGNOSIS — Z992 Dependence on renal dialysis: Secondary | ICD-10-CM | POA: Diagnosis present

## 2018-09-09 DIAGNOSIS — Z8349 Family history of other endocrine, nutritional and metabolic diseases: Secondary | ICD-10-CM

## 2018-09-09 DIAGNOSIS — D631 Anemia in chronic kidney disease: Secondary | ICD-10-CM | POA: Diagnosis present

## 2018-09-09 DIAGNOSIS — I2510 Atherosclerotic heart disease of native coronary artery without angina pectoris: Secondary | ICD-10-CM | POA: Diagnosis present

## 2018-09-09 DIAGNOSIS — Z9049 Acquired absence of other specified parts of digestive tract: Secondary | ICD-10-CM

## 2018-09-09 DIAGNOSIS — K8689 Other specified diseases of pancreas: Secondary | ICD-10-CM

## 2018-09-09 DIAGNOSIS — E876 Hypokalemia: Secondary | ICD-10-CM | POA: Diagnosis present

## 2018-09-09 DIAGNOSIS — Z825 Family history of asthma and other chronic lower respiratory diseases: Secondary | ICD-10-CM

## 2018-09-09 DIAGNOSIS — I480 Paroxysmal atrial fibrillation: Secondary | ICD-10-CM | POA: Diagnosis present

## 2018-09-09 DIAGNOSIS — Z79899 Other long term (current) drug therapy: Secondary | ICD-10-CM

## 2018-09-09 DIAGNOSIS — Z90710 Acquired absence of both cervix and uterus: Secondary | ICD-10-CM

## 2018-09-09 DIAGNOSIS — I25119 Atherosclerotic heart disease of native coronary artery with unspecified angina pectoris: Secondary | ICD-10-CM | POA: Diagnosis not present

## 2018-09-09 DIAGNOSIS — Z951 Presence of aortocoronary bypass graft: Secondary | ICD-10-CM

## 2018-09-09 DIAGNOSIS — N186 End stage renal disease: Secondary | ICD-10-CM | POA: Diagnosis present

## 2018-09-09 DIAGNOSIS — N2581 Secondary hyperparathyroidism of renal origin: Secondary | ICD-10-CM | POA: Diagnosis present

## 2018-09-09 DIAGNOSIS — Z8679 Personal history of other diseases of the circulatory system: Secondary | ICD-10-CM

## 2018-09-09 DIAGNOSIS — G8929 Other chronic pain: Secondary | ICD-10-CM | POA: Diagnosis present

## 2018-09-09 DIAGNOSIS — Z9115 Patient's noncompliance with renal dialysis: Secondary | ICD-10-CM

## 2018-09-09 DIAGNOSIS — Z955 Presence of coronary angioplasty implant and graft: Secondary | ICD-10-CM

## 2018-09-09 DIAGNOSIS — Z7902 Long term (current) use of antithrombotics/antiplatelets: Secondary | ICD-10-CM

## 2018-09-09 LAB — CBC
HEMATOCRIT: 38.4 % (ref 36.0–46.0)
Hemoglobin: 11.7 g/dL — ABNORMAL LOW (ref 12.0–15.0)
MCH: 28.7 pg (ref 26.0–34.0)
MCHC: 30.5 g/dL (ref 30.0–36.0)
MCV: 94.1 fL (ref 80.0–100.0)
Platelets: 351 10*3/uL (ref 150–400)
RBC: 4.08 MIL/uL (ref 3.87–5.11)
RDW: 18.3 % — ABNORMAL HIGH (ref 11.5–15.5)
WBC: 20.5 10*3/uL — ABNORMAL HIGH (ref 4.0–10.5)
nRBC: 0 % (ref 0.0–0.2)

## 2018-09-09 LAB — I-STAT TROPONIN, ED: Troponin i, poc: 0.09 ng/mL (ref 0.00–0.08)

## 2018-09-09 LAB — COMPREHENSIVE METABOLIC PANEL
ALT: 11 U/L (ref 0–44)
ANION GAP: 14 (ref 5–15)
AST: 22 U/L (ref 15–41)
Albumin: 3.3 g/dL — ABNORMAL LOW (ref 3.5–5.0)
Alkaline Phosphatase: 46 U/L (ref 38–126)
BILIRUBIN TOTAL: 1.1 mg/dL (ref 0.3–1.2)
BUN: 41 mg/dL — ABNORMAL HIGH (ref 8–23)
CO2: 27 mmol/L (ref 22–32)
Calcium: 9.4 mg/dL (ref 8.9–10.3)
Chloride: 96 mmol/L — ABNORMAL LOW (ref 98–111)
Creatinine, Ser: 3.49 mg/dL — ABNORMAL HIGH (ref 0.44–1.00)
GFR calc Af Amer: 15 mL/min — ABNORMAL LOW (ref >60)
GFR calc non Af Amer: 13 mL/min — ABNORMAL LOW (ref >60)
Glucose, Bld: 164 mg/dL — ABNORMAL HIGH (ref 70–99)
Potassium: 3.1 mmol/L — ABNORMAL LOW (ref 3.5–5.1)
Sodium: 137 mmol/L (ref 135–145)
TOTAL PROTEIN: 6.5 g/dL (ref 6.5–8.1)

## 2018-09-09 LAB — GLUCOSE, CAPILLARY: Glucose-Capillary: 94 mg/dL (ref 70–99)

## 2018-09-09 LAB — TROPONIN I: TROPONIN I: 0.09 ng/mL — AB (ref <0.03)

## 2018-09-09 LAB — I-STAT CG4 LACTIC ACID, ED: Lactic Acid, Venous: 1.65 mmol/L (ref 0.5–1.9)

## 2018-09-09 LAB — LIPASE, BLOOD: Lipase: 2164 U/L — ABNORMAL HIGH (ref 11–51)

## 2018-09-09 MED ORDER — MOMETASONE FURO-FORMOTEROL FUM 200-5 MCG/ACT IN AERO
2.0000 | INHALATION_SPRAY | Freq: Two times a day (BID) | RESPIRATORY_TRACT | Status: DC
  Administered 2018-09-10 – 2018-09-15 (×10): 2 via RESPIRATORY_TRACT
  Filled 2018-09-09 (×2): qty 8.8

## 2018-09-09 MED ORDER — ATORVASTATIN CALCIUM 40 MG PO TABS
40.0000 mg | ORAL_TABLET | Freq: Every day | ORAL | Status: DC
  Administered 2018-09-10 – 2018-09-14 (×5): 40 mg via ORAL
  Filled 2018-09-09 (×5): qty 1

## 2018-09-09 MED ORDER — AMLODIPINE BESYLATE 5 MG PO TABS
5.0000 mg | ORAL_TABLET | Freq: Every day | ORAL | Status: DC
  Administered 2018-09-09 – 2018-09-15 (×7): 5 mg via ORAL
  Filled 2018-09-09 (×7): qty 1

## 2018-09-09 MED ORDER — PANTOPRAZOLE SODIUM 40 MG PO TBEC
40.0000 mg | DELAYED_RELEASE_TABLET | Freq: Every day | ORAL | Status: DC
  Administered 2018-09-09 – 2018-09-15 (×7): 40 mg via ORAL
  Filled 2018-09-09 (×7): qty 1

## 2018-09-09 MED ORDER — SODIUM CHLORIDE 0.9 % IV SOLN
INTRAVENOUS | Status: DC
  Administered 2018-09-09 – 2018-09-10 (×2): via INTRAVENOUS

## 2018-09-09 MED ORDER — HYDRALAZINE HCL 25 MG PO TABS
25.0000 mg | ORAL_TABLET | Freq: Three times a day (TID) | ORAL | Status: DC
  Administered 2018-09-09 – 2018-09-15 (×14): 25 mg via ORAL
  Filled 2018-09-09 (×14): qty 1

## 2018-09-09 MED ORDER — LIDOCAINE-PRILOCAINE 2.5-2.5 % EX CREA
1.0000 "application " | TOPICAL_CREAM | CUTANEOUS | Status: DC | PRN
  Filled 2018-09-09: qty 5

## 2018-09-09 MED ORDER — SODIUM CHLORIDE 0.9 % IV SOLN: 100.0000 mL | INTRAVENOUS | Status: DC | PRN

## 2018-09-09 MED ORDER — SODIUM CHLORIDE 0.9 % IV BOLUS
500.0000 mL | Freq: Once | INTRAVENOUS | Status: AC
  Administered 2018-09-09: 500 mL via INTRAVENOUS

## 2018-09-09 MED ORDER — CHLORHEXIDINE GLUCONATE CLOTH 2 % EX PADS
6.0000 | MEDICATED_PAD | Freq: Every day | CUTANEOUS | Status: DC
  Administered 2018-09-10: 6 via TOPICAL

## 2018-09-09 MED ORDER — SODIUM CHLORIDE 0.9 % IV BOLUS: 500.0000 mL | Freq: Once | INTRAVENOUS | Status: DC

## 2018-09-09 MED ORDER — HYDROMORPHONE HCL 1 MG/ML IJ SOLN
1.0000 mg | INTRAMUSCULAR | Status: DC | PRN
  Administered 2018-09-09 – 2018-09-14 (×29): 1 mg via INTRAVENOUS
  Filled 2018-09-09 (×29): qty 1

## 2018-09-09 MED ORDER — CLOPIDOGREL BISULFATE 75 MG PO TABS
75.0000 mg | ORAL_TABLET | Freq: Every day | ORAL | Status: DC
  Administered 2018-09-10 – 2018-09-15 (×6): 75 mg via ORAL
  Filled 2018-09-09 (×6): qty 1

## 2018-09-09 MED ORDER — CARVEDILOL 12.5 MG PO TABS
12.5000 mg | ORAL_TABLET | Freq: Two times a day (BID) | ORAL | Status: DC
  Administered 2018-09-10 – 2018-09-15 (×11): 12.5 mg via ORAL
  Filled 2018-09-09 (×11): qty 1

## 2018-09-09 MED ORDER — ONDANSETRON HCL 4 MG/2ML IJ SOLN
4.0000 mg | Freq: Once | INTRAMUSCULAR | Status: AC
  Administered 2018-09-09: 4 mg via INTRAVENOUS
  Filled 2018-09-09: qty 2

## 2018-09-09 MED ORDER — FAMOTIDINE IN NACL 20-0.9 MG/50ML-% IV SOLN
20.0000 mg | Freq: Once | INTRAVENOUS | Status: AC
  Administered 2018-09-09: 20 mg via INTRAVENOUS
  Filled 2018-09-09: qty 50

## 2018-09-09 MED ORDER — ONDANSETRON HCL 4 MG/2ML IJ SOLN
4.0000 mg | Freq: Four times a day (QID) | INTRAMUSCULAR | Status: DC | PRN
  Administered 2018-09-10 – 2018-09-13 (×8): 4 mg via INTRAVENOUS
  Filled 2018-09-09 (×9): qty 2

## 2018-09-09 MED ORDER — INSULIN ASPART 100 UNIT/ML ~~LOC~~ SOLN: 0.0000 [IU] | Freq: Three times a day (TID) | SUBCUTANEOUS | Status: DC

## 2018-09-09 MED ORDER — ONDANSETRON HCL 4 MG PO TABS: 4.0000 mg | ORAL_TABLET | Freq: Four times a day (QID) | ORAL | Status: DC | PRN

## 2018-09-09 MED ORDER — HYDROMORPHONE HCL 1 MG/ML IJ SOLN
1.0000 mg | Freq: Once | INTRAMUSCULAR | Status: AC
  Administered 2018-09-09: 1 mg via INTRAVENOUS
  Filled 2018-09-09: qty 1

## 2018-09-09 MED ORDER — HEPARIN SODIUM (PORCINE) 1000 UNIT/ML DIALYSIS
1000.0000 [IU] | INTRAMUSCULAR | Status: DC | PRN
  Filled 2018-09-09: qty 1

## 2018-09-09 MED ORDER — PANCRELIPASE (LIP-PROT-AMYL) 12000-38000 UNITS PO CPEP
24000.0000 [IU] | ORAL_CAPSULE | Freq: Three times a day (TID) | ORAL | Status: DC
  Administered 2018-09-10 – 2018-09-15 (×15): 24000 [IU] via ORAL
  Filled 2018-09-09 (×15): qty 2

## 2018-09-09 MED ORDER — PENTAFLUOROPROP-TETRAFLUOROETH EX AERO: 1.0000 "application " | INHALATION_SPRAY | CUTANEOUS | Status: DC | PRN

## 2018-09-09 MED ORDER — HEPARIN SODIUM (PORCINE) 5000 UNIT/ML IJ SOLN
5000.0000 [IU] | Freq: Three times a day (TID) | INTRAMUSCULAR | Status: DC
  Administered 2018-09-09 – 2018-09-10 (×3): 5000 [IU] via SUBCUTANEOUS
  Filled 2018-09-09 (×7): qty 1

## 2018-09-09 MED ORDER — LIDOCAINE HCL (PF) 1 % IJ SOLN: 5.0000 mL | INTRAMUSCULAR | Status: DC | PRN

## 2018-09-09 NOTE — Consult Note (Signed)
[facility]    NEPHROLOGY CONSULTATION NOTE  PATIENT ID:  [name], DOB:  1951-01-21  HPI: The patient is a 68 y.o. year old female patient with a past medical history significant for coronary artery disease status post CABG in 2012 with a drug-eluting stent placed in October 2016.  She has a history of peripheral vascular disease and is status post left carotid endarterectomy in 2012.  She has a history of abdominal aortic aneurysm status post repair in 2009 and bilateral renal artery stents.  She is followed by Dr. Posey Pronto as an outpatient.  She had a recent admission for pancreatitis.  She had a follow-up CT which did show some improvement.  However, after her CAT scan, she noticed increasing epigastric abdominal pain, nausea, and vomiting.  She denies any shortness of breath.  She presented to the emergency department for abdominal pain, and is being admitted for acute pancreatitis.  Renal consultation has been called for end-stage renal disease on hemodialysis.  Her last dialysis treatment is on Thursday.  She has a Tuesday, Thursday, Saturday dialysis patient.   Past Medical History:  Diagnosis Date  . AAA (abdominal aortic aneurysm) ([place]) 5/09   3.7x3.3 by u/s 2009  . Abnormal EKG    deep TW inversions chronic  . Anemia   . CAD (coronary artery disease)    a. CABG 03/2011 LIMA to LAD, SVG to OM, SVG to PDA. b. cath 06/25/2015 DES to SVG to rPDA, patent LIMA to LAD, patent SVG to OM  . carotid stenosis 5/09   S/p L CEA ;  60-79% bilat ICA by preCABG dopplers 7/12  . CHF (congestive heart failure) ([place])   . Chronic back pain    "all over" (06/24/2015)  . Chronic bronchitis ([place])    "get it pretty much q yr" (06/24/2015)  . CKD (chronic kidney disease)   . Complication of anesthesia 1966   "problem w/ether"  . COPD (chronic obstructive pulmonary disease) ([place])   . Depression   . GERD (gastroesophageal reflux disease)    With hiatal hernia  . GIB (gastrointestinal  bleeding) 9/11   S/p EGD with cautery at [facility]  . Heart murmur   . History of blood transfusion "a few times"   "all related to anemia" (06/24/2015)  . History of hiatal hernia   . Hyperlipidemia   . Hypertension   . Mitral regurgitation    3+ MR by intraoperative TEE;  s/p MV repair with Dr. Roxy Manns 7/12  . Myocardial infarction [facility]) 2012 "several"  . On home oxygen therapy    "2 liters at night; negative for sleep apnea"  . PAD (peripheral artery disease) ([facility])    Severe; s/p bilateral renal artery stents, moderate in-stent restenosis  . Pancreatitis 05/2018  . Paroxysmal atrial fibrillation ([facility])    coumadin;  echo 9/07: EF 60%, mild LVH;  s/p Cox Maze 7/12 with LAA clipping  . Subclavian artery stenosis, left ([facility])    stented by Dr. Trula Slade on 10/18 to help flow of her LIMA to LAD  . Type II diabetes mellitus ([place])     Past Surgical History:  Procedure Laterality Date  . A/V FISTULAGRAM Left 03/21/2018   Procedure: A/V FISTULAGRAM;  Surgeon: Serafina Mitchell, MD;  Location: [place] [facility];  Service: Cardiovascular;  Laterality: Left;  . ABDOMINAL AORTIC ENDOVASCULAR STENT GRAFT N/A 01/17/2018   Procedure: ABDOMINAL AORTIC ENDOVASCULAR STENT GRAFT WITH CO2;  Surgeon: Serafina Mitchell, MD;  Location: [place];  Service: Vascular;  Laterality: N/A;  . ABDOMINAL HYSTERECTOMY  1990's  . APPENDECTOMY  Aug. 11, 2016   Ruptured  . AV FISTULA PLACEMENT Left 11/03/2017   Procedure: ARTERIOVENOUS (AV) FISTULA CREATION LEFT ARM;  Surgeon: Serafina Mitchell, MD;  Location: [place];  Service: Vascular;  Laterality: Left;  . BOWEL RESECTION  2013  . CARDIAC CATHETERIZATION N/A 06/24/2015   Procedure: Left Heart Cath and Cors/Grafts Angiography;  Surgeon: Leonie Man, MD;  Location: [place] [facility];  Service: Cardiovascular;  Laterality: N/A;  . CARDIAC CATHETERIZATION N/A 06/25/2015   Procedure: Coronary Stent Intervention;  Surgeon: Sherren Mocha, MD;  Location: [place] [facility];   Service: Cardiovascular;  Laterality: N/A;  . CAROTID ENDARTERECTOMY Left   . CATARACT EXTRACTION Right 03/27/2018  . CHOLECYSTECTOMY N/A 04/19/2018   Procedure: LAPAROSCOPIC CHOLECYSTECTOMY WITH INTRAOPERATIVE CHOLANGIOGRAM;  Surgeon: Rolm Bookbinder, MD;  Location: [place];  Service: General;  Laterality: N/A;  . CORONARY ANGIOPLASTY    . CORONARY ARTERY BYPASS GRAFT  03/05/2011   CABG X3 (LIMA to LAD, SVG to OM, SVG to PDA, EVH via left thigh  . CORONARY STENT PLACEMENT    . ESOPHAGOGASTRODUODENOSCOPY N/A 04/12/2018   Procedure: ESOPHAGOGASTRODUODENOSCOPY (EGD);  Surgeon: [name], MD;  Location: [facility];  Service: Endoscopy;  Laterality: N/A;  . ESOPHAGOGASTRODUODENOSCOPY N/A 06/16/2018   Procedure: ESOPHAGOGASTRODUODENOSCOPY (EGD);  Surgeon: Thornton Park, MD;  Location: [place];  Service: Gastroenterology;  Laterality: N/A;  . LACERATION REPAIR Right 1990's   WRIST  . MAZE  03/05/2011   complete biatrial lesion set with clipping of LA appendage  . MITRAL VALVE REPAIR  03/05/2011   59mm Memo 3D ring annuloplasty for ischemic MR  . PERIPHERAL VASCULAR BALLOON ANGIOPLASTY Left 03/21/2018   Procedure: PERIPHERAL VASCULAR BALLOON ANGIOPLASTY;  Surgeon: Serafina Mitchell, MD;  Location: [place] [facility];  Service: Cardiovascular;  Laterality: Left;  . PERIPHERAL VASCULAR CATHETERIZATION N/A 06/24/2015   Procedure: Aortic Arch Angiography;  Surgeon: Serafina Mitchell, MD;  Location: [place] [facility];  Service: Cardiovascular;  Laterality: N/A;  . PERIPHERAL VASCULAR CATHETERIZATION  06/24/2015   Procedure: Peripheral Vascular Intervention;  Surgeon: Serafina Mitchell, MD;  Location: [place] [facility];  Service: Cardiovascular;;  . PERIPHERAL VASCULAR CATHETERIZATION N/A 06/24/2015   Procedure: Abdominal Aortogram;  Surgeon: Serafina Mitchell, MD;  Location: [place] [facility];  Service: Cardiovascular;  Laterality: N/A;  . RENAL ANGIOGRAM Bilateral 09/11/2013   Procedure: RENAL  ANGIOGRAM;  Surgeon: Serafina Mitchell, MD;  Location[facility];  Service: Cardiovascular;  Laterality: Bilateral;  . RIGHT FEMORAL-POPLITEAL BYPASS      Family History  Problem Relation Age of Onset  . Emphysema Mother   . Heart disease Mother        before age 70  . Hypertension Mother   . Hyperlipidemia Mother   . Heart attack Mother   . AAA (abdominal aortic aneurysm) Mother        rupture  . Heart attack Father 22  . Emphysema Father   . Heart disease Father        before age 77  . Hyperlipidemia Father   . Hypertension Father   . Peripheral vascular disease Father   . Diabetes Brother   . Heart disease Brother        before age 23  . Hyperlipidemia Brother   . Hypertension Brother   . Heart attack Brother        CABG  .  Heart disease Other        Vascular disease in grandparents, uncles and dad  . Colon cancer Neg Hx   . Esophageal cancer Neg Hx   . Inflammatory bowel disease Neg Hx   . Liver disease Neg Hx   . Pancreatic cancer Neg Hx   . Rectal cancer Neg Hx   . Stomach cancer Neg Hx     Social History   Tobacco Use  . Smoking status: Current Every Day Smoker    Years: 50.00    Types: Cigarettes  . Smokeless tobacco: Never Used  . Tobacco comment: 3/4 pk per day  Substance Use Topics  . Alcohol use: Yes    Alcohol/week: 0.0 standard drinks    Comment: occasionally  . Drug use: Yes    Types: Marijuana    REVIEW OF SYSTEMS: General:  no fatigue, positive weakness  head:  no headaches Eyes:  no blurred vision ENT:  no sore throat Neck:  no masses CV:  no chest pain, no orthopnea Lungs:  no shortness of breath, no cough GI: Positive nausea, positive abdominal pain. GU:  no dysuria or hematuria Skin:  no rashes or lesions Neuro:  no focal numbness or weakness Psych:  no depression or anxiety    PHYSICAL EXAM:  Vitals:   09/09/18 1700 09/09/18 1715  BP: (!) 193/71 (!) 191/76  Pulse: 75 76  Resp: 17 17  Temp:    SpO2: 93% 96%   No  intake/output data recorded.   General:  AAOx3 NAD HEENT: MMM [place] AT anicteric sclera Neck:  No JVD, no adenopathy CV:  Heart RRR  Lungs:  L/S CTA bilaterally Abd:  abd mildly distended, tender to palpation.  With normal BS GU:  Bladder non-palpable Extremities: +2 bilateral LE edema. Skin:  No skin rash Psych:  normal mood and affect Neuro:  no focal deficits  MEDICATIONS:  No current facility-administered medications for this encounter.   Current Outpatient Medications:  .  acetaminophen (TYLENOL) 325 MG tablet, Take 325 mg by mouth every 6 (six) hours as needed (for pain)., Disp: , Rfl:  .  Albuterol Sulfate (PROAIR RESPICLICK) 474 (90 Base) MCG/ACT AEPB, Inhale 1 puff into the lungs every 4 (four) hours as needed (shortness of breath/wheezing)., Disp: , Rfl:  .  allopurinol (ZYLOPRIM) 100 MG tablet, Take 100 mg by mouth daily., Disp: , Rfl: 2 .  Amino Acids-Protein Hydrolys (FEEDING SUPPLEMENT, PRO-STAT SUGAR FREE 64,) LIQD, Take 30 mLs by mouth 3 (three) times daily with meals., Disp: 900 mL, Rfl: 0 .  amLODipine (NORVASC) 5 MG tablet, Take 1 tablet (5 mg total) by mouth daily., Disp: 30 tablet, Rfl: 0 .  atorvastatin (LIPITOR) 40 MG tablet, Take 40 mg by mouth daily. , Disp: , Rfl:  .  B Complex-C-Folic Acid (DIALYVITE PO), Take 1 tablet by mouth daily., Disp: , Rfl:  .  budesonide-formoterol (SYMBICORT) 160-4.5 MCG/ACT inhaler, Inhale 2 puffs into the lungs 2 (two) times daily. , Disp: , Rfl:  .  calcium carbonate (TUMS EX) 750 MG chewable tablet, Chew 2 tablets by mouth 3 (three) times daily as needed for heartburn. , Disp: , Rfl:  .  carvedilol (COREG) 12.5 MG tablet, Take 1 tablet (12.5 mg total) by mouth 2 (two) times daily with a meal., Disp: , Rfl:  .  clopidogrel (PLAVIX) 75 MG tablet, Take 1 tablet (75 mg total) by mouth daily with breakfast., Disp: 90 tablet, Rfl: 3 .  Cyanocobalamin (VITAMIN B-12) 5000 MCG  SUBL, Place 5,000 mcg under the tongue daily., Disp: , Rfl:  .   feeding supplement (BOOST / RESOURCE BREEZE) LIQD, Take 1 Container by mouth 2 (two) times daily., Disp: , Rfl:  .  fenofibrate 160 MG tablet, Take 1 tablet (160 mg total) by mouth daily., Disp: 30 tablet, Rfl: 1 .  gabapentin (NEURONTIN) 300 MG capsule, Take 1 capsule (300 mg total) by mouth at bedtime., Disp: 30 capsule, Rfl: 0 .  hydrALAZINE (APRESOLINE) 25 MG tablet, Take 1 tablet (25 mg total) by mouth every 8 (eight) hours., Disp: , Rfl:  .  HYDROmorphone (DILAUDID) 2 MG tablet, Take 2 mg by mouth every 4 (four) hours. , Disp: , Rfl:  .  lipase/protease/amylase 24000-76000 units CPEP, Take 1 capsule (24,000 Units total) by mouth 3 (three) times daily with meals., Disp: 90 capsule, Rfl: 0 .  loperamide (IMODIUM A-D) 2 MG tablet, Take 2 mg by mouth 4 (four) times daily as needed for diarrhea or loose stools., Disp: , Rfl:  .  OXYGEN, Inhale into the lungs at bedtime., Disp: , Rfl:  .  pantoprazole (PROTONIX) 40 MG tablet, Take 1 tablet (40 mg total) by mouth daily., Disp: 30 tablet, Rfl: 0 .  polyethylene glycol (MIRALAX / GLYCOLAX) packet, Take 17 g by mouth daily as needed for mild constipation., Disp: 14 each, Rfl: 0 .  albuterol (PROVENTIL HFA;VENTOLIN HFA) 108 (90 Base) MCG/ACT inhaler, Inhale 2 puffs into the lungs every 4 (four) hours as needed for wheezing or shortness of breath. ProAir (Patient not taking: Reported on 09/09/2018), Disp: 1 each, Rfl: 0 .  ferrous sulfate 325 (65 FE) MG tablet, Take 1 tablet (325 mg total) by mouth daily with breakfast. (Patient not taking: Reported on 09/09/2018), Disp: 30 tablet, Rfl: 0     LABS:  CBC Latest Ref Rng & Units 09/09/2018 08/22/2018 08/20/2018  WBC 4.0 - 10.5 K/uL 20.5(H) - -  Hemoglobin 12.0 - 15.0 g/dL 11.7(L) 7.3(L) 8.2(L)  Hematocrit 36.0 - 46.0 % 38.4 24.5(L) 27.5(L)  Platelets 150 - 400 K/uL 351 - -    CMP Latest Ref Rng & Units 09/09/2018 08/22/2018 08/21/2018  Glucose 70 - 99 mg/dL 164(H) 117(H) 88  BUN 8 - 23 mg/dL 41(H) 41(H)  40(H)  Creatinine 0.44 - 1.00 mg/dL 3.49(H) 2.49(H) 2.32(H)  Sodium 135 - 145 mmol/L 137 135 136  Potassium 3.5 - 5.1 mmol/L 3.1(L) 4.1 4.0  Chloride 98 - 111 mmol/L 96(L) 98 100  CO2 22 - 32 mmol/L 27 25 26   Calcium 8.9 - 10.3 mg/dL 9.4 9.0 8.6(L)  Total Protein 6.5 - 8.1 g/dL 6.5 - -  Total Bilirubin 0.3 - 1.2 mg/dL 1.1 - -  Alkaline Phos 38 - 126 U/L 46 - -  AST 15 - 41 U/L 22 - -  ALT 0 - 44 U/L 11 - -    Lab Results  Component Value Date   PTH 80 (H) 08/15/2018   CALCIUM 9.4 09/09/2018   CAION 1.10 (L) 03/21/2018   PHOS 4.2 08/22/2018       Component Value Date/Time   COLORURINE YELLOW 08/15/2018 [place] 08/15/2018 0656   LABSPEC 1.006 08/15/2018 0656   PHURINE 5.0 08/15/2018 [place] 08/15/2018 [place] 08/15/2018 [place] 08/15/2018 0656   [place] 08/15/2018 0656   PROTEINUR NEGATIVE 08/15/2018 0656   UROBILINOGEN 0.2 03/04/2011 2107   NITRITE NEGATIVE 08/15/2018 0656   LEUKOCYTESUR NEGATIVE 08/15/2018 5573  Component Value Date/Time   PHART 7.372 07/24/2018 2015   PCO2ART 38.7 07/24/2018 2015   PO2ART 68.0 (L) 07/24/2018 2015   HCO3 22.0 07/24/2018 2015   TCO2 30 03/21/2018 0630   ACIDBASEDEF 2.5 (H) 07/24/2018 2015   O2SAT 93.4 07/24/2018 2015       Component Value Date/Time   IRON 19 (L) 08/15/2018 1435   TIBC 295 08/15/2018 1435   FERRITIN 1,240 (H) 07/25/2018 1705   IRONPCTSAT 6 (L) 08/15/2018 1435       ASSESSMENT/PLAN:     Problem List Items Addressed This Visit      Digestive   * (Principal) Acute recurrent pancreatitis - Primary   Relevant Medications   HYDROmorphone (DILAUDID) injection 1 mg (Completed)   famotidine (PEPCID) IVPB 20 mg premix (Completed)     Genitourinary   ESRD (end stage renal disease) ([place])      1.  End-stage renal disease on hemodialysis.  We will plan for dialysis tomorrow.  She is normally a Tuesday, Thursday, Saturday dialysis  patient.  We will plan dialysis this Sunday.  No urgency for dialysis today.  Will avoid ultrafiltration in the setting of acute pancreatitis.  2.  Anemia.  We will continue outpatient dose of erythropoietin stimulating agent.  3.  Acute pancreatitis.  Has been volume resuscitated.  Will avoid ultrafiltration tonight on dialysis.  3.  Secondary hyperparathyroidism.  We will continue outpatient therapy.    [place], DO, [place]

## 2018-09-09 NOTE — ED Notes (Signed)
I Stat Trop I result of 0.09 reported to Dr. Darl Householder

## 2018-09-09 NOTE — ED Notes (Signed)
Pt returns from radiology. 

## 2018-09-09 NOTE — H&P (Signed)
History and Physical    [name] OMV:[id number] DOB: Apr 03, 1951 DOA: 09/09/2018  PCP: Bernerd Limbo, MD  Patient coming from: Home  Chief Complaint: Abdominal pain   HPI: [name] is a 68 y.o. female with medical history significant of ESRD on dialysis, abdominal aortic aneurysm status post repair, CAD who was recently admitted for pancreatitis returns again for abdominal pain.  Briefly, she was diagnosed with gallstone pancreatitis in August 2019, underwent cholecystectomy, has been repeatedly admitted for recurrent pancreatitis, pseudocyst.  She had a follow-up CT abdomen pelvis yesterday afternoon as an outpatient which revealed interval improvement of pancreatitis and inflammation and fluid surrounding the descending duodenum.  She states that after her CT scan, she started having worsening epigastric abdominal pain, nausea, vomiting. She denies any fevers, chills or shortness of breath.  She does admit to some central chest pain that is sharp in nature, radiating from her abdomen.  No worsening peripheral edema.  ED Course: Labs obtained which was significant for potassium 3.1, creatinine 3.49, lipase 2164, troponin 0.09, WBC 20.5, lactic acid 1.65.  Abdominal x-ray obtained and pending at time of admission.  Review of Systems: As per HPI otherwise 10 point review of systems negative.   Past Medical History:  Diagnosis Date  . AAA (abdominal aortic aneurysm) ([place]) 5/09   3.7x3.3 by u/s 2009  . Abnormal EKG    deep TW inversions chronic  . Anemia   . CAD (coronary artery disease)    a. CABG 03/2011 LIMA to LAD, SVG to OM, SVG to PDA. b. cath 06/25/2015 DES to SVG to rPDA, patent LIMA to LAD, patent SVG to OM  . carotid stenosis 5/09   S/p L CEA ;  60-79% bilat ICA by preCABG dopplers 7/12  . CHF (congestive heart failure) ([place])   . Chronic back pain    "all over" (06/24/2015)  . Chronic bronchitis ([place])    "get it pretty much q yr" (06/24/2015)  . CKD (chronic kidney  disease)   . Complication of anesthesia 1966   "problem w/ether"  . COPD (chronic obstructive pulmonary disease) ([place])   . Depression   . GERD (gastroesophageal reflux disease)    With hiatal hernia  . GIB (gastrointestinal bleeding) 9/11   S/p EGD with cautery at [facility]  . Heart murmur   . History of blood transfusion "a few times"   "all related to anemia" (06/24/2015)  . History of hiatal hernia   . Hyperlipidemia   . Hypertension   . Mitral regurgitation    3+ MR by intraoperative TEE;  s/p MV repair with Dr. Roxy Manns 7/12  . Myocardial infarction [facility]) 2012 "several"  . On home oxygen therapy    "2 liters at night; negative for sleep apnea"  . PAD (peripheral artery disease) (HCC)    Severe; s/p bilateral renal artery stents, moderate in-stent restenosis  . Pancreatitis 05/2018  . Paroxysmal atrial fibrillation (HCC)    coumadin;  echo 9/07: EF 60%, mild LVH;  s/p Cox Maze 7/12 with LAA clipping  . Subclavian artery stenosis, left (HCC)    stented by Dr. Trula Slade on 10/18 to help flow of her LIMA to LAD  . Type II diabetes mellitus ([place])     Past Surgical History:  Procedure Laterality Date  . A/V FISTULAGRAM Left 03/21/2018   Procedure: A/V FISTULAGRAM;  Surgeon: Serafina Mitchell, MD;  Location: [place] [facility];  Service: Cardiovascular;  Laterality: Left;  . ABDOMINAL AORTIC ENDOVASCULAR STENT GRAFT N/A  01/17/2018   Procedure: ABDOMINAL AORTIC ENDOVASCULAR STENT GRAFT WITH CO2;  Surgeon: Serafina Mitchell, MD;  Location: [place];  Service: Vascular;  Laterality: N/A;  . ABDOMINAL HYSTERECTOMY  1990's  . APPENDECTOMY  Aug. 11, 2016   Ruptured  . AV FISTULA PLACEMENT Left 11/03/2017   Procedure: ARTERIOVENOUS (AV) FISTULA CREATION LEFT ARM;  Surgeon: Serafina Mitchell, MD;  Location: [place];  Service: Vascular;  Laterality: Left;  . BOWEL RESECTION  2013  . CARDIAC CATHETERIZATION N/A 06/24/2015   Procedure: Left Heart Cath and Cors/Grafts Angiography;  Surgeon: Leonie Man, MD;   Location: [place] [facility];  Service: Cardiovascular;  Laterality: N/A;  . CARDIAC CATHETERIZATION N/A 06/25/2015   Procedure: Coronary Stent Intervention;  Surgeon: Sherren Mocha, MD;  Location: [place] [facility];  Service: Cardiovascular;  Laterality: N/A;  . CAROTID ENDARTERECTOMY Left   . CATARACT EXTRACTION Right 03/27/2018  . CHOLECYSTECTOMY N/A 04/19/2018   Procedure: LAPAROSCOPIC CHOLECYSTECTOMY WITH INTRAOPERATIVE CHOLANGIOGRAM;  Surgeon: Rolm Bookbinder, MD;  Location: [place];  Service: General;  Laterality: N/A;  . CORONARY ANGIOPLASTY    . CORONARY ARTERY BYPASS GRAFT  03/05/2011   CABG X3 (LIMA to LAD, SVG to OM, SVG to PDA, EVH via left thigh  . CORONARY STENT PLACEMENT    . ESOPHAGOGASTRODUODENOSCOPY N/A 04/12/2018   Procedure: ESOPHAGOGASTRODUODENOSCOPY (EGD);  Surgeon: [name], MD;  Location: [facility];  Service: Endoscopy;  Laterality: N/A;  . ESOPHAGOGASTRODUODENOSCOPY N/A 06/16/2018   Procedure: ESOPHAGOGASTRODUODENOSCOPY (EGD);  Surgeon: Thornton Park, MD;  Location: [place];  Service: Gastroenterology;  Laterality: N/A;  . LACERATION REPAIR Right 1990's   WRIST  . MAZE  03/05/2011   complete biatrial lesion set with clipping of LA appendage  . MITRAL VALVE REPAIR  03/05/2011   34m Memo 3D ring annuloplasty for ischemic MR  . PERIPHERAL VASCULAR BALLOON ANGIOPLASTY Left 03/21/2018   Procedure: PERIPHERAL VASCULAR BALLOON ANGIOPLASTY;  Surgeon: BSerafina Mitchell MD;  [place] [place];  Service: Cardiovascular;  Laterality: Left;  . PERIPHERAL VASCULAR CATHETERIZATION N/A 06/24/2015   Procedure: Aortic Arch Angiography;  Surgeon: VSerafina Mitchell MD;  [facility];  Service: Cardiovascular;  Laterality: N/A;  . PERIPHERAL VASCULAR CATHETERIZATION  06/24/2015   Procedure: Peripheral Vascular Intervention;  Surgeon: VSerafina Mitchell MD;  [facility];  Service: Cardiovascular;;  . PERIPHERAL VASCULAR  CATHETERIZATION N/A 06/24/2015   Procedure: Abdominal Aortogram;  Surgeon: VSerafina Mitchell MD;  [place] [facility];  Service: Cardiovascular;  Laterality: N/A;  . RENAL ANGIOGRAM Bilateral 09/11/2013   Procedure: RENAL ANGIOGRAM;  Surgeon: VSerafina Mitchell MD;  [facility];  Service: Cardiovascular;  Laterality: Bilateral;  . RIGHT FEMORAL-POPLITEAL BYPASS       reports that she has been smoking cigarettes. She has smoked for the past 50.00 years. She has never used smokeless tobacco. She reports current alcohol use. She reports current drug use. Drug: Marijuana.  Allergies  Allergen Reactions  . Isosorbide Other (See Comments)    Can only tolerate in low doses  . Isosorbide Nitrate Other (See Comments)    Can only tolerate in low doses  . Oxycodone-Acetaminophen Itching    Tolerates Hydrocodone.  Also tolerated oxycodone 07/13/18 admission.    . Codeine Itching  . Contrast Media [Iodinated Diagnostic Agents] Itching and Other (See Comments)    Itching of feet  . Ioxaglate Itching and Other (See Comments)    Itching of feet  .  Metrizamide Itching and Other (See Comments)    Itching of feet  . Percocet [Oxycodone-Acetaminophen] Itching and Other (See Comments)    Tolerates Hydrocodone    Family History  Problem Relation Age of Onset  . Emphysema Mother   . Heart disease Mother        before age 50  . Hypertension Mother   . Hyperlipidemia Mother   . Heart attack Mother   . AAA (abdominal aortic aneurysm) Mother        rupture  . Heart attack Father 48  . Emphysema Father   . Heart disease Father        before age 39  . Hyperlipidemia Father   . Hypertension Father   . Peripheral vascular disease Father   . Diabetes Brother   . Heart disease Brother        before age 74  . Hyperlipidemia Brother   . Hypertension Brother   . Heart attack Brother        CABG  . Heart disease Other        Vascular disease in grandparents, uncles and dad  . Colon  cancer Neg Hx   . Esophageal cancer Neg Hx   . Inflammatory bowel disease Neg Hx   . Liver disease Neg Hx   . Pancreatic cancer Neg Hx   . Rectal cancer Neg Hx   . Stomach cancer Neg Hx     Prior to Admission medications   Medication Sig Start Date End Date Taking? Authorizing Provider  acetaminophen (TYLENOL) 325 MG tablet Take 325 mg by mouth every 6 (six) hours as needed (for pain).    [provider]  albuterol (PROVENTIL HFA;VENTOLIN HFA) 108 (90 Base) MCG/ACT inhaler Inhale 2 puffs into the lungs every 4 (four) hours as needed for wheezing or shortness of breath. ProAir 10/08/15   Delfina Redwood, MD  allopurinol (ZYLOPRIM) 100 MG tablet Take 100 mg by mouth daily. 02/02/17   [provider]  Amino Acids-Protein Hydrolys (FEEDING SUPPLEMENT, PRO-STAT SUGAR FREE 64,) LIQD Take 30 mLs by mouth 3 (three) times daily with meals. 06/26/18   Thurnell Lose, MD  amLODipine (NORVASC) 5 MG tablet Take 1 tablet (5 mg total) by mouth daily. 08/23/18   Mikhail, Velta Addison, DO  atorvastatin (LIPITOR) 40 MG tablet Take 40 mg by mouth daily.  12/07/17   [provider]  budesonide-formoterol (SYMBICORT) 160-4.5 MCG/ACT inhaler Inhale 2 puffs into the lungs 2 (two) times daily as needed (for flares).    [provider]  carvedilol (COREG) 12.5 MG tablet Take 1 tablet (12.5 mg total) by mouth 2 (two) times daily with a meal. 06/26/18   Thurnell Lose, MD  clopidogrel (PLAVIX) 75 MG tablet Take 1 tablet (75 mg total) by mouth daily with breakfast. 06/26/15   Almyra Deforest, PA  Cyanocobalamin (VITAMIN B-12) 5000 MCG SUBL Place 5,000 mcg under the tongue daily.    [provider]  feeding supplement (BOOST / RESOURCE BREEZE) LIQD Take 1 Container by mouth 2 (two) times daily.    [provider]  fenofibrate 160 MG tablet Take 1 tablet (160 mg total) by mouth daily. 10/08/15   Delfina Redwood, MD  ferrous sulfate 325 (65 FE) MG tablet Take 1 tablet (325  mg total) by mouth daily with breakfast. 07/19/18   Florencia Reasons, MD  gabapentin (NEURONTIN) 300 MG capsule Take 1 capsule (300 mg total) by mouth at bedtime. 08/22/18   Cristal Ford, DO  hydrALAZINE (APRESOLINE) 25 MG tablet Take 1 tablet (25 mg total) by mouth every 8 (eight) hours. 06/26/18   Thurnell Lose, MD  HYDROmorphone (DILAUDID) 2 MG tablet Take 2 mg by mouth every 4 (four) hours as needed for severe pain.    [provider]  lipase/protease/amylase 24000-76000 units CPEP Take 1 capsule (24,000 Units total) by mouth 3 (three) times daily with meals. 08/22/18   Mikhail, Velta Addison, DO  loperamide (IMODIUM A-D) 2 MG tablet Take 2 mg by mouth 4 (four) times daily as needed for diarrhea or loose stools.    [provider]  NARCAN 4 MG/0.1ML LIQD nasal spray kit Place 1 spray into the nose once.  07/21/18   [provider]  pantoprazole (PROTONIX) 40 MG tablet Take 1 tablet (40 mg total) by mouth daily. 08/03/18   Patrecia Pour, MD  polyethylene glycol (MIRALAX / Floria Raveling) packet Take 17 g by mouth daily as needed for mild constipation. 06/26/18   Thurnell Lose, MD  predniSONE (DELTASONE) 50 MG tablet Take Prednisone 50 mg 13 hours before IV contrast, 7 hours before and 1 hour before IV contrast 09/07/18   Mansouraty, Telford Nab., MD    Physical Exam: Vitals:   09/09/18 1259  BP: (!) 179/74  Pulse: 76  Resp: 18  Temp: 97.9 F (36.6 C)  TempSrc: Oral  SpO2: 94%     Constitutional: NAD, calm, appears uncomfortable  Eyes: PERRL, lids and conjunctivae normal ENMT: Mucous membranes are dry. Posterior pharynx clear of any exudate or lesions.Normal dentition.  Neck: normal, supple, no masses, no thyromegaly Respiratory: clear to auscultation bilaterally, no wheezing, no crackles. Normal respiratory effort. No accessory muscle use.  Cardiovascular: Regular rate and rhythm, no murmurs / rubs / gallops. +Bilateral LE edema.  Abdomen: +TTP epigastric, soft and  nondistended. Bowel sounds positive.  Musculoskeletal: no clubbing / cyanosis. No joint deformity upper and lower extremities. Good ROM, no contractures. Normal muscle tone.  Skin: no rashes, lesions, ulcers. No induration Neurologic: Alert oriented, speech clear, nonfocal examination Psychiatric: Normal judgment and insight. Alert and oriented x 3. Normal mood.   Labs on Admission: I have personally reviewed following labs and imaging studies  CBC: Recent Labs  Lab 09/09/18 1424  WBC 20.5*  HGB 11.7*  HCT 38.4  MCV 94.1  PLT 517   Basic Metabolic Panel: Recent Labs  Lab 09/09/18 1424  NA 137  K 3.1*  CL 96*  CO2 27  GLUCOSE 164*  BUN 41*  CREATININE 3.49*  CALCIUM 9.4   GFR: CrCl cannot be calculated (Unknown ideal weight.). Liver Function Tests: Recent Labs  Lab 09/09/18 1424  AST 22  ALT 11  ALKPHOS 46  BILITOT 1.1  PROT 6.5  ALBUMIN 3.3*   Recent Labs  Lab 09/09/18 1424  LIPASE 2,164*   No results for input(s): AMMONIA in the last 168 hours. Coagulation Profile: No results for input(s): INR, PROTIME in the last 168 hours. Cardiac Enzymes: No results for input(s): CKTOTAL, CKMB, CKMBINDEX, TROPONINI in the last 168 hours. BNP (last 3 results) No results for input(s): PROBNP in the last 8760 hours. HbA1C: No results for input(s): HGBA1C in the last 72 hours. CBG: No results for input(s): GLUCAP in the last 168 hours. Lipid Profile: No results for input(s): CHOL, HDL, LDLCALC, TRIG, CHOLHDL, LDLDIRECT in the last 72 hours. Thyroid Function Tests: No results for input(s): TSH, T4TOTAL, FREET4, T3FREE, THYROIDAB in the last 72 hours. Anemia Panel: No results for input(s): VITAMINB12,  FOLATE, FERRITIN, TIBC, IRON, RETICCTPCT in the last 72 hours. Urine analysis:    Component Value Date/Time   COLORURINE YELLOW 08/15/2018 [place] 08/15/2018 0656   LABSPEC 1.006 08/15/2018 0656   PHURINE 5.0 08/15/2018 0656   GLUCOSEU NEGATIVE  08/15/2018 0656   HGBUR NEGATIVE 08/15/2018 0656   BILIRUBINUR NEGATIVE 08/15/2018 0656   [place] 08/15/2018 0656   PROTEINUR NEGATIVE 08/15/2018 0656   UROBILINOGEN 0.2 03/04/2011 2107   NITRITE NEGATIVE 08/15/2018 0656   LEUKOCYTESUR NEGATIVE 08/15/2018 0656   Sepsis Labs: !!!!!!!!!!!!!!!!!!!!!!!!!!!!!!!!!!!!!!!!!!!! '@[id number]'$ (procalcitonin:4,lacticidven:4) )No results found for this or any previous visit (from the past 240 hour(s)).   Radiological Exams on Admission: Ct Abdomen Pelvis Wo Contrast  Result Date: 09/08/2018 CLINICAL DATA:  History of pancreatitis. EXAM: CT ABDOMEN AND PELVIS WITHOUT CONTRAST TECHNIQUE: Multidetector CT imaging of the abdomen and pelvis was performed following the standard protocol without IV contrast. COMPARISON:  CT scan 08/01/2018 and MRI abdomen 07/10/2018 FINDINGS: Lower chest: The lung bases are grossly clear. No acute infiltrates or effusions. The heart is enlarged but stable. Stable coronary artery calcifications and aortic calcifications. Hepatobiliary: No focal hepatic lesions or intrahepatic biliary dilatation. The gallbladder is surgically absent. Mild associated common bile duct dilatation. Pancreas: Improved inflammation and edema involving the pancreatic head and adjacent second portion the duodenum. There is some fluid between the duodenum and the liver but this is slightly smaller. Findings likely represent changes of resolving groove pancreatitis. No findings for duodenal obstruction. No pancreatic ductal dilatation. Spleen: Normal size.  No focal lesions. Adrenals/Urinary Tract: The adrenal glands and kidneys are unremarkable and stable. Stable extensive renal artery calcifications. No hydronephrosis. The bladder is grossly normal. Stomach/Bowel: The stomach, duodenum, small bowel and colon are grossly normal. Stable surgical changes involving the sigmoid colon. No acute inflammatory changes, mass lesions or obstructive findings.  Vascular/Lymphatic: Stable severe atherosclerotic calcifications involving the aorta, branch vessels and iliac arteries. The patient has a stable appearing aortoiliac stent graft without complicating features. No mesenteric or retroperitoneal mass or adenopathy. Small scattered lymph nodes are stable. Reproductive: Surgically absent. Other: No pelvic mass or free pelvic fluid collections. No inguinal mass or adenopathy. Musculoskeletal: No significant bony findings. IMPRESSION: 1. Some interval improvement of the probable groove pancreatitis and inflammation and fluid surrounding the descending duodenum. No discrete pancreatic or duodenal mass is identified. 2. Status post cholecystectomy with mild biliary dilatation. 3. Advanced atherosclerotic disease involving the aorta and iliac arteries. Electronically Signed   By: Marijo Sanes M.D.   On: 09/08/2018 15:40    EKG: Independently reviewed.  Normal sinus rhythm with PVCs  Assessment/Plan Principal Problem:   Acute recurrent pancreatitis Active Problems:   Essential hypertension   Coronary artery disease involving native heart with angina pectoris (HCC)   Chronic diastolic heart failure (HCC)   Diabetes mellitus (HCC)   Chronic diastolic congestive heart failure ([place])   ESRD (end stage renal disease) ([place])   Acute recurrent pancreatitis  -Complicated history of initially presumed gallstone pancreatitis status post cholecystectomy in August 2019 with subsequent recurrent pancreatitis requiring hospital admissions.  She has been followed by Dr. Rush Landmark, [facility], and underwent CT abdomen pelvis without contrast as an outpatient on 09/08/18 which revealed some interval improvement of pancreatitis and inflammation of fluid surrounding the descending duodenum.  No discrete pancreaticoduodenal mass was found. -Now presents to the hospital with 1 day history of worsening epigastric abdominal pain, nausea, vomiting, lipase 2164 -NPO, pain control,  IVF  -Spoke with Dr.  Cirigilano [facility] who recommends supportive care, his team will consult in AM   ESRD on dialysis TTS -Nephrology consulted by EDP  Demand ischemia -Troponin 0 0.09, trend  Coronary artery disease -Continue Plavix, statin  Type 2 diabetes -SSI  HTN -Continue norvasc, hydralazine   Chronic hypoxemic respiratory failure with COPD -At baseline uses 2 L nasal cannula O2 at nighttime  Chronic diastolic dysfunction -Echocardiogram 05/30/2018 shows an EF of 60 to 03%, grade 2 diastolic dysfunction -Continue coreg    DVT prophylaxis: Subq hep Code Status: Full  Family Communication: No family at bedside Disposition Plan: Pending improvement in abdominal pain Consults called: Nephrology by EDP for dialysis, [facility] Admission status: Inpatient   Severity of Illness: The appropriate patient status for this patient is INPATIENT. Inpatient status is judged to be reasonable and necessary in order to provide the required intensity of service to ensure the patient's safety. The patient's presenting symptoms, physical exam findings, and initial radiographic and laboratory data in the context of their chronic comorbidities is felt to place them at high risk for further clinical deterioration. Furthermore, it is not anticipated that the patient will be medically stable for discharge from the hospital within 2 midnights of admission. The following factors support the patient status of inpatient.   " The patient's presenting symptoms include epigastric abdominal pain. " The worrisome physical exam findings include tenderness to palpation of epigastric abdomen. " The initial radiographic and laboratory data are worrisome because of elevated leukocytosis, lipase. " The chronic co-morbidities include ESRD, recurrent pancreatitis, coronary artery disease.   * I certify that at the point of admission it is my clinical judgment that the patient will require inpatient hospital  care spanning beyond 2 midnights from the point of admission due to high intensity of service, high risk for further deterioration and high frequency of surveillance required.*   Dessa Phi, DO [facility] [email or web address] 09/09/2018, 4:49 PM

## 2018-09-09 NOTE — ED Triage Notes (Signed)
Onset 24 hours abd pain, vomiting x 4, last vomit 12p, dark red.  Last took dilaudid at 11:20a with no relief.  Missed dialysis today d/t pain.

## 2018-09-09 NOTE — ED Provider Notes (Signed)
[facility] EMERGENCY DEPARTMENT Provider Note   CSN: [id number] Arrival date & time: 09/09/18  1256     History   Chief Complaint Chief Complaint  Patient presents with  . Abdominal Pain  . Emesis    HPI [name] is a 68 y.o. female hx of AAA, CAD, CHF, chronic pancreatitis complicated with a pseudocyst, here presenting with abdominal pain, vomiting.  Patient recently started on dialysis and was admitted about a month ago for pancreatitis with pseudocyst.  She has been followed with [facility] since discharge and has been on Zofran and Dilaudid.  She states that since yesterday she had vomiting and eventually was blood-tinged.  Patient also has severe epigastric pain as well.  Patient took some Dilaudid with no relief.  Patient supposed to go to dialysis today but missed dialysis due to severe abdominal pain.  Patient had a CT abdomen pelvis yesterday done outpatient as ordered by her GI doctor that showed decreased fluid around the pancreas.  The history is provided by the patient.    Past Medical History:  Diagnosis Date  . AAA (abdominal aortic aneurysm) ([place]) 5/09   3.7x3.3 by u/s 2009  . Abnormal EKG    deep TW inversions chronic  . Anemia   . CAD (coronary artery disease)    a. CABG 03/2011 LIMA to LAD, SVG to OM, SVG to PDA. b. cath 06/25/2015 DES to SVG to rPDA, patent LIMA to LAD, patent SVG to OM  . carotid stenosis 5/09   S/p L CEA ;  60-79% bilat ICA by preCABG dopplers 7/12  . CHF (congestive heart failure) ([place])   . Chronic back pain    "all over" (06/24/2015)  . Chronic bronchitis ([place])    "get it pretty much q yr" (06/24/2015)  . CKD (chronic kidney disease)   . Complication of anesthesia 1966   "problem w/ether"  . COPD (chronic obstructive pulmonary disease) ([place])   . Depression   . GERD (gastroesophageal reflux disease)    With hiatal hernia  . GIB (gastrointestinal bleeding) 9/11   S/p EGD with cautery at [facility]  . Heart murmur     . History of blood transfusion "a few times"   "all related to anemia" (06/24/2015)  . History of hiatal hernia   . Hyperlipidemia   . Hypertension   . Mitral regurgitation    3+ MR by intraoperative TEE;  s/p MV repair with Dr. Roxy Manns 7/12  . Myocardial infarction [facility]) 2012 "several"  . On home oxygen therapy    "2 liters at night; negative for sleep apnea"  . PAD (peripheral artery disease) (HCC)    Severe; s/p bilateral renal artery stents, moderate in-stent restenosis  . Pancreatitis 05/2018  . Paroxysmal atrial fibrillation (HCC)    coumadin;  echo 9/07: EF 60%, mild LVH;  s/p Cox Maze 7/12 with LAA clipping  . Subclavian artery stenosis, left (HCC)    stented by Dr. Trula Slade on 10/18 to help flow of her LIMA to LAD  . Type II diabetes mellitus [facility])     Patient Active Problem List   Diagnosis Date Noted  . Renal failure (ARF), acute on chronic (HCC) 08/15/2018  . ARF (acute renal failure) ([place]) 08/15/2018  . CKD (chronic kidney disease), stage V ([place]) 08/15/2018  . Hypomagnesemia 08/15/2018  . Acute pulmonary edema ([place]) 08/14/2018  . Goals of care, counseling/discussion   . DNR (do not resuscitate) discussion   . AKI (acute kidney injury) (  [place]) 07/26/2018  . Acute encephalopathy 07/24/2018  . Chronic pain 07/24/2018  . Malnutrition of moderate degree 07/13/2018  . Stage II pressure ulcer ([place]) 07/12/2018  . Fluid collection at surgical site   . Intraabdominal fluid collection   . Cystic duct calculus   . RUQ pain 07/08/2018  . Peripancreatic fluid collection 07/04/2018  . 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 Urinary retention 07/04/2018  . S/P insertion of iliac artery stent 07/04/2018  . Endoleak post (EVAR) endovascular aneurysm repair ([place]) 07/04/2018  . Physical deconditioning   . Esophageal ulcer with bleeding   . Pressure injury of skin 06/16/2018  . Hematemesis   . Palliative care patient   . Palliative care by specialist   . Anasarca 06/07/2018  .  Demand ischemia ([place]) 06/07/2018  . Protein-calorie malnutrition, severe 05/29/2018  . Type II diabetes mellitus with renal manifestations ([place]) 05/28/2018  . Recurrent pancreatitis 05/28/2018  . Hypokalemia 05/28/2018  . SIRS (systemic inflammatory response syndrome) ([place]) 05/28/2018  . Pancreatitis 05/28/2018  . Volume overload 04/17/2018  . Anemia due to GI blood loss 04/14/2018  . Acute gallstone pancreatitis 04/14/2018  . Acute cholecystitis 04/14/2018  . Gastrointestinal hemorrhage   . Fatigue associated with anemia 04/09/2018  . Hypoglycemia 01/19/2018  . Type II diabetes mellitus ([place]) 01/19/2018  . Nausea with vomiting 01/19/2018  . Uncontrolled hypertension 01/19/2018  . Hyperlipidemia 01/19/2018  . CAD (coronary artery disease) 01/19/2018  . COPD with hypoxia ([place]) 01/19/2018  . Chronic kidney disease (CKD), stage IV (severe) ([place]) 01/19/2018  . Retroperitoneal hematoma 01/19/2018  . Acute blood loss anemia 01/19/2018  . Chronic diastolic congestive heart failure ([place]) 01/19/2018  . AAA (abdominal aortic aneurysm) ([place]) 01/16/2018  . Elevated brain natriuretic peptide (BNP) level 10/07/2015  . Diabetes mellitus ([place]) 10/07/2015  . Pain in the chest   . PAD (peripheral artery disease) ([place]) 07/29/2015  . Chest pain 07/04/2015  . Acute renal failure superimposed on stage 4 chronic kidney disease ([place]) 07/04/2015  . GERD (gastroesophageal reflux disease) 07/04/2015  . Anemia 06/26/2015  . Coronary artery disease involving autologous vein coronary bypass graft with unstable angina pectoris ([place]) 06/24/2015  . Angina at rest [facility])   . Subclavian artery stenosis, left ([place]) 06/23/2015  . AAA (abdominal aortic aneurysm) without rupture ([place]) 12/10/2014  . Subclavian steal syndrome 12/10/2014  . Renal artery stenosis ([place]) 09/04/2013  . Carotid artery stenosis 09/28/2012  . Mitral valve disorder 06/23/2011  . S/P CABG x 3 06/16/2011  . S/P Maze operation for atrial  fibrillation 06/16/2011  . S/P MVR (mitral valve repair) 06/16/2011  . Follow-up examination following surgery 03/29/2011  . CKD (chronic kidney disease) 03/03/2011  . COPD (chronic obstructive pulmonary disease) ([place]) 03/03/2011  . Chronic diastolic heart failure ([place]) 02/26/2011  . Hypotension 02/26/2011  . NSTEMI (non-ST elevated myocardial infarction) ([place]) 02/26/2011  . Long term (current) use of anticoagulants 12/17/2010  . OBSTRUCTIVE SLEEP APNEA 09/04/2010  . HYPOXEMIA 09/04/2010  . SNORING 08/05/2010  . Atrial fibrillation ([place]) 12/09/2009  . WEIGHT LOSS 03/28/2009  . PALPITATIONS 03/28/2009  . PVD 11/21/2008  . HYPERLIPIDEMIA-MIXED 11/20/2008  . TOBACCO ABUSE 11/20/2008  . Essential hypertension 11/20/2008  . Coronary artery disease involving native heart with angina pectoris ([place]) 11/20/2008  . ABDOMINAL AORTIC ANEURYSM 11/20/2008    Past Surgical History:  Procedure Laterality Date  . A/V FISTULAGRAM Left 03/21/2018   Procedure: A/V FISTULAGRAM;  Surgeon: Serafina Mitchell, MD;  Location: [place] CV LAB;  Service: Cardiovascular;  Laterality:  Left;  . ABDOMINAL AORTIC ENDOVASCULAR STENT GRAFT N/A 01/17/2018   Procedure: ABDOMINAL AORTIC ENDOVASCULAR STENT GRAFT WITH CO2;  Surgeon: Serafina Mitchell, MD;  Location: [facility];  Service: Vascular;  Laterality: N/A;  . ABDOMINAL HYSTERECTOMY  1990's  . APPENDECTOMY  Aug. 11, 2016   Ruptured  . AV FISTULA PLACEMENT Left 11/03/2017   Procedure: ARTERIOVENOUS (AV) FISTULA CREATION LEFT ARM;  Surgeon: Serafina Mitchell, MD;  Location: [facility];  Service: Vascular;  Laterality: Left;  . BOWEL RESECTION  2013  . CARDIAC CATHETERIZATION N/A 06/24/2015   Procedure: Left Heart Cath and Cors/Grafts Angiography;  Surgeon: Leonie Man, MD;  Location: [facility] CV LAB;  Service: Cardiovascular;  Laterality: N/A;  . CARDIAC CATHETERIZATION N/A 06/25/2015   Procedure: Coronary Stent Intervention;  Surgeon: Sherren Mocha, MD;  Location:  [place] CV LAB;  Service: Cardiovascular;  Laterality: N/A;  . CAROTID ENDARTERECTOMY Left   . CATARACT EXTRACTION Right 03/27/2018  . CHOLECYSTECTOMY N/A 04/19/2018   Procedure: LAPAROSCOPIC CHOLECYSTECTOMY WITH INTRAOPERATIVE CHOLANGIOGRAM;  Surgeon: Rolm Bookbinder, MD;  Location: [facility];  Service: General;  Laterality: N/A;  . CORONARY ANGIOPLASTY    . CORONARY ARTERY BYPASS GRAFT  03/05/2011   CABG X3 (LIMA to LAD, SVG to OM, SVG to PDA, EVH via left thigh  . CORONARY STENT PLACEMENT    . ESOPHAGOGASTRODUODENOSCOPY N/A 04/12/2018   Procedure: ESOPHAGOGASTRODUODENOSCOPY (EGD);  Surgeon: [name], MD;  Location: [facility] ENDOSCOPY;  Service: Endoscopy;  Laterality: N/A;  . ESOPHAGOGASTRODUODENOSCOPY N/A 06/16/2018   Procedure: ESOPHAGOGASTRODUODENOSCOPY (EGD);  Surgeon: Thornton Park, MD;  Location: [facility];  Service: Gastroenterology;  Laterality: N/A;  . LACERATION REPAIR Right 1990's   WRIST  . MAZE  03/05/2011   complete biatrial lesion set with clipping of LA appendage  . MITRAL VALVE REPAIR  03/05/2011   37m Memo 3D ring annuloplasty for ischemic MR  . PERIPHERAL VASCULAR BALLOON ANGIOPLASTY Left 03/21/2018   Procedure: PERIPHERAL VASCULAR BALLOON ANGIOPLASTY;  Surgeon: BSerafina Mitchell MD;  [place] [place];  Service: Cardiovascular;  Laterality: Left;  . PERIPHERAL VASCULAR CATHETERIZATION N/A 06/24/2015   Procedure: Aortic Arch Angiography;  Surgeon: VSerafina Mitchell MD;  [facility] [place];  Service: Cardiovascular;  Laterality: N/A;  . PERIPHERAL VASCULAR CATHETERIZATION  06/24/2015   Procedure: Peripheral Vascular Intervention;  Surgeon: VSerafina Mitchell MD;  [place];  Service: Cardiovascular;;  . PERIPHERAL VASCULAR CATHETERIZATION N/A 06/24/2015   Procedure: Abdominal Aortogram;  Surgeon: VSerafina Mitchell MD;  [place];  Service: Cardiovascular;  Laterality: N/A;  . RENAL ANGIOGRAM Bilateral  09/11/2013   Procedure: RENAL ANGIOGRAM;  Surgeon: VSerafina Mitchell MD;  [facility];  Service: Cardiovascular;  Laterality: Bilateral;  . RIGHT FEMORAL-POPLITEAL BYPASS       OB History   No obstetric history on file.      Home Medications    Prior to Admission medications   Medication Sig Start Date End Date Taking? Authorizing Provider  acetaminophen (TYLENOL) 325 MG tablet Take 325 mg by mouth every 6 (six) hours as needed (for pain).    [provider]  albuterol (PROVENTIL HFA;VENTOLIN HFA) 108 (90 Base) MCG/ACT inhaler Inhale 2 puffs into the lungs every 4 (four) hours as needed for wheezing or shortness of breath. ProAir 10/08/15   SDelfina Redwood MD  allopurinol (ZYLOPRIM) 100 MG tablet Take 100 mg by mouth daily. 02/02/17   [provider]  Amino  Acids-Protein Hydrolys (FEEDING SUPPLEMENT, PRO-STAT SUGAR FREE 64,) LIQD Take 30 mLs by mouth 3 (three) times daily with meals. 06/26/18   Thurnell Lose, MD  amLODipine (NORVASC) 5 MG tablet Take 1 tablet (5 mg total) by mouth daily. 08/23/18   Mikhail, Velta Addison, DO  atorvastatin (LIPITOR) 40 MG tablet Take 40 mg by mouth daily.  12/07/17   [provider]  budesonide-formoterol (SYMBICORT) 160-4.5 MCG/ACT inhaler Inhale 2 puffs into the lungs 2 (two) times daily as needed (for flares).    [provider]  carvedilol (COREG) 12.5 MG tablet Take 1 tablet (12.5 mg total) by mouth 2 (two) times daily with a meal. 06/26/18   Thurnell Lose, MD  clopidogrel (PLAVIX) 75 MG tablet Take 1 tablet (75 mg total) by mouth daily with breakfast. 06/26/15   Almyra Deforest, PA  Cyanocobalamin (VITAMIN B-12) 5000 MCG SUBL Place 5,000 mcg under the tongue daily.    [provider]  feeding supplement (BOOST / RESOURCE BREEZE) LIQD Take 1 Container by mouth 2 (two) times daily.    [provider]  fenofibrate 160 MG tablet Take 1 tablet (160 mg total) by mouth daily. 10/08/15   Delfina Redwood, MD  ferrous sulfate 325 (65 FE) MG tablet Take 1 tablet (325 mg total) by mouth daily with breakfast. 07/19/18   Florencia Reasons, MD  gabapentin (NEURONTIN) 300 MG capsule Take 1 capsule (300 mg total) by mouth at bedtime. 08/22/18   Mikhail, Velta Addison, DO  hydrALAZINE (APRESOLINE) 25 MG tablet Take 1 tablet (25 mg total) by mouth every 8 (eight) hours. 06/26/18   Thurnell Lose, MD  HYDROmorphone (DILAUDID) 2 MG tablet Take 2 mg by mouth every 4 (four) hours as needed for severe pain.    [provider]  lipase/protease/amylase 24000-76000 units CPEP Take 1 capsule (24,000 Units total) by mouth 3 (three) times daily with meals. 08/22/18   Mikhail, Velta Addison, DO  loperamide (IMODIUM A-D) 2 MG tablet Take 2 mg by mouth 4 (four) times daily as needed for diarrhea or loose stools.    [provider]  NARCAN 4 MG/0.1ML LIQD nasal spray kit Place 1 spray into the nose once.  07/21/18   [provider]  pantoprazole (PROTONIX) 40 MG tablet Take 1 tablet (40 mg total) by mouth daily. 08/03/18   Patrecia Pour, MD  polyethylene glycol (MIRALAX / Floria Raveling) packet Take 17 g by mouth daily as needed for mild constipation. 06/26/18   Thurnell Lose, MD  predniSONE (DELTASONE) 50 MG tablet Take Prednisone 50 mg 13 hours before IV contrast, 7 hours before and 1 hour before IV contrast 09/07/18   Mansouraty, Telford Nab., MD    Family History Family History  Problem Relation Age of Onset  . Emphysema Mother   . Heart disease Mother        before age 57  . Hypertension Mother   . Hyperlipidemia Mother   . Heart attack Mother   . AAA (abdominal aortic aneurysm) Mother        rupture  . Heart attack Father 77  . Emphysema Father   . Heart disease Father        before age 19  . Hyperlipidemia Father   . Hypertension Father   . Peripheral vascular disease Father   . Diabetes Brother   . Heart disease Brother        before age 42  . Hyperlipidemia Brother   . Hypertension Brother     .  Heart attack Brother        CABG  . Heart disease Other        Vascular disease in grandparents, uncles and dad  . Colon cancer Neg Hx   . Esophageal cancer Neg Hx   . Inflammatory bowel disease Neg Hx   . Liver disease Neg Hx   . Pancreatic cancer Neg Hx   . Rectal cancer Neg Hx   . Stomach cancer Neg Hx     Social History Social History   Tobacco Use  . Smoking status: Current Every Day Smoker    Years: 50.00    Types: Cigarettes  . Smokeless tobacco: Never Used  . Tobacco comment: 3/4 pk per day  Substance Use Topics  . Alcohol use: Yes    Alcohol/week: 0.0 standard drinks    Comment: occasionally  . Drug use: Yes    Types: Marijuana     Allergies   Isosorbide; Isosorbide nitrate; Oxycodone-acetaminophen; Codeine; Contrast media [iodinated diagnostic agents]; Ioxaglate; Metrizamide; and Percocet [oxycodone-acetaminophen]   Review of Systems Review of Systems  Gastrointestinal: Positive for abdominal pain and vomiting.  All other systems reviewed and are negative.    Physical Exam Updated Vital Signs BP (!) 179/74 (BP Location: Right Arm)   Pulse 76   Temp 97.9 F (36.6 C) (Oral)   Resp 18   SpO2 94%   Physical Exam Vitals signs and nursing note reviewed.  HENT:     Head: Normocephalic.     Mouth/Throat:     Comments: MM dry  Eyes:     Extraocular Movements: Extraocular movements intact.  Cardiovascular:     Rate and Rhythm: Normal rate.  Pulmonary:     Effort: Pulmonary effort is normal.     Breath sounds: Normal breath sounds.  Abdominal:     General: Bowel sounds are normal.     Palpations: Abdomen is rigid.     Tenderness: There is abdominal tenderness in the epigastric area.     Comments: + epigastric tenderness   Skin:    General: Skin is warm.     Capillary Refill: Capillary refill takes less than 2 seconds.  Neurological:     General: No focal deficit present.     Mental Status: She is alert.  Psychiatric:        Mood and  Affect: Mood normal.        Behavior: Behavior normal.      ED Treatments / Results  Labs (all labs ordered are listed, but only abnormal results are displayed) Labs Reviewed  LIPASE, BLOOD - Abnormal; Notable for the following components:      Result Value   Lipase 2,164 (*)    All other components within normal limits  COMPREHENSIVE METABOLIC PANEL - Abnormal; Notable for the following components:   Potassium 3.1 (*)    Chloride 96 (*)    Glucose, Bld 164 (*)    BUN 41 (*)    Creatinine, Ser 3.49 (*)    Albumin 3.3 (*)    GFR calc non Af Amer 13 (*)    GFR calc Af Amer 15 (*)    All other components within normal limits  CBC - Abnormal; Notable for the following components:   WBC 20.5 (*)    Hemoglobin 11.7 (*)    RDW 18.3 (*)    All other components within normal limits  CULTURE, BLOOD (ROUTINE X 2)  CULTURE, BLOOD (ROUTINE X 2)  URINALYSIS, ROUTINE W REFLEX MICROSCOPIC  I-STAT CG4 LACTIC ACID, ED    EKG None  Radiology Ct Abdomen Pelvis Wo Contrast  Result Date: 09/08/2018 CLINICAL DATA:  History of pancreatitis. EXAM: CT ABDOMEN AND PELVIS WITHOUT CONTRAST TECHNIQUE: Multidetector CT imaging of the abdomen and pelvis was performed following the standard protocol without IV contrast. COMPARISON:  CT scan 08/01/2018 and MRI abdomen 07/10/2018 FINDINGS: Lower chest: The lung bases are grossly clear. No acute infiltrates or effusions. The heart is enlarged but stable. Stable coronary artery calcifications and aortic calcifications. Hepatobiliary: No focal hepatic lesions or intrahepatic biliary dilatation. The gallbladder is surgically absent. Mild associated common bile duct dilatation. Pancreas: Improved inflammation and edema involving the pancreatic head and adjacent second portion the duodenum. There is some fluid between the duodenum and the liver but this is slightly smaller. Findings likely represent changes of resolving groove pancreatitis. No findings for duodenal  obstruction. No pancreatic ductal dilatation. Spleen: Normal size.  No focal lesions. Adrenals/Urinary Tract: The adrenal glands and kidneys are unremarkable and stable. Stable extensive renal artery calcifications. No hydronephrosis. The bladder is grossly normal. Stomach/Bowel: The stomach, duodenum, small bowel and colon are grossly normal. Stable surgical changes involving the sigmoid colon. No acute inflammatory changes, mass lesions or obstructive findings. Vascular/Lymphatic: Stable severe atherosclerotic calcifications involving the aorta, branch vessels and iliac arteries. The patient has a stable appearing aortoiliac stent graft without complicating features. No mesenteric or retroperitoneal mass or adenopathy. Small scattered lymph nodes are stable. Reproductive: Surgically absent. Other: No pelvic mass or free pelvic fluid collections. No inguinal mass or adenopathy. Musculoskeletal: No significant bony findings. IMPRESSION: 1. Some interval improvement of the probable groove pancreatitis and inflammation and fluid surrounding the descending duodenum. No discrete pancreatic or duodenal mass is identified. 2. Status post cholecystectomy with mild biliary dilatation. 3. Advanced atherosclerotic disease involving the aorta and iliac arteries. Electronically Signed   By: Marijo Sanes M.D.   On: 09/08/2018 15:40    Procedures Procedures (including critical care time)  CRITICAL CARE Performed by: Wandra Arthurs   Total critical care time: 30 minutes  Critical care time was exclusive of separately billable procedures and treating other patients.  Critical care was necessary to treat or prevent imminent or life-threatening deterioration.  Critical care was time spent personally by me on the following activities: development of treatment plan with patient and/or surrogate as well as nursing, discussions with consultants, evaluation of patient's response to treatment, examination of patient,  obtaining history from patient or surrogate, ordering and performing treatments and interventions, ordering and review of laboratory studies, ordering and review of radiographic studies, pulse oximetry and re-evaluation of patient's condition.   Medications Ordered in ED Medications  famotidine (PEPCID) IVPB 20 mg premix (has no administration in time range)  sodium chloride 0.9 % bolus 500 mL (has no administration in time range)  HYDROmorphone (DILAUDID) injection 1 mg (1 mg Intravenous Given 09/09/18 1601)  ondansetron (ZOFRAN) injection 4 mg (4 mg Intravenous Given 09/09/18 1600)     Initial Impression / Assessment and Plan / ED Course  I have reviewed the triage vital signs and the nursing notes.  Pertinent labs & imaging results that were available during my care of the patient were reviewed by me and considered in my medical decision making (see chart for details).    KAYTLYN DIN is a 68 y.o. female here with vomiting. Patient started vomiting yesterday. Did have a CT yesterday showed decreased fluid around the pancreas. Suspect recurrent pancreatitis. Patient denies alcohol  use and had previous cholecystectomy. Will get CBC, CMP, lipase.   4:17 PM Lipase 2000. LFTs unremarkable. WBC 20. Given IVF, pain meds. Will admit for recurrent pancreatitis. Of note, patient has elevated trop likely demand ischemia. EKG showed PVCs but no STEMI. Nephrology notified about patient and will put patient on the list for dialysis. Hospitalist to readmit.    Final Clinical Impressions(s) / ED Diagnoses   Final diagnoses:  None    ED Discharge Orders    None       Drenda Freeze, MD 09/09/18 1649

## 2018-09-09 NOTE — Progress Notes (Signed)
CRITICAL VALUE ALERT  Critical Value:  Troponin 0.09  Date & Time Notied:  09/09/2018 at 2035  Provider Notified: [place]  Orders Received/Actions taken: waiting for orders

## 2018-09-10 DIAGNOSIS — K920 Hematemesis: Secondary | ICD-10-CM

## 2018-09-10 LAB — GLUCOSE, CAPILLARY
GLUCOSE-CAPILLARY: 72 mg/dL (ref 70–99)
Glucose-Capillary: 100 mg/dL — ABNORMAL HIGH (ref 70–99)
Glucose-Capillary: 82 mg/dL (ref 70–99)
Glucose-Capillary: 77 mg/dL (ref 70–99)
Glucose-Capillary: 58 mg/dL — ABNORMAL LOW (ref 70–99)
Glucose-Capillary: 105 mg/dL — ABNORMAL HIGH (ref 70–99)

## 2018-09-10 LAB — CBC
HEMATOCRIT: 34.6 % — AB (ref 36.0–46.0)
HEMOGLOBIN: 10.9 g/dL — AB (ref 12.0–15.0)
MCH: 29.8 pg (ref 26.0–34.0)
MCHC: 31.5 g/dL (ref 30.0–36.0)
MCV: 94.5 fL (ref 80.0–100.0)
Platelets: 274 10*3/uL (ref 150–400)
RBC: 3.66 MIL/uL — AB (ref 3.87–5.11)
RDW: 18 % — ABNORMAL HIGH (ref 11.5–15.5)
WBC: 24.4 10*3/uL — ABNORMAL HIGH (ref 4.0–10.5)
nRBC: 0 % (ref 0.0–0.2)

## 2018-09-10 LAB — BASIC METABOLIC PANEL
Anion gap: 17 — ABNORMAL HIGH (ref 5–15)
BUN: 49 mg/dL — ABNORMAL HIGH (ref 8–23)
CO2: 21 mmol/L — AB (ref 22–32)
Calcium: 8.6 mg/dL — ABNORMAL LOW (ref 8.9–10.3)
Chloride: 100 mmol/L (ref 98–111)
Creatinine, Ser: 3.75 mg/dL — ABNORMAL HIGH (ref 0.44–1.00)
GFR calc Af Amer: 14 mL/min — ABNORMAL LOW (ref >60)
GFR calc non Af Amer: 12 mL/min — ABNORMAL LOW (ref >60)
Glucose, Bld: 104 mg/dL — ABNORMAL HIGH (ref 70–99)
Potassium: 3.2 mmol/L — ABNORMAL LOW (ref 3.5–5.1)
Sodium: 138 mmol/L (ref 135–145)

## 2018-09-10 LAB — TROPONIN I
Troponin I: 0.11 ng/mL (ref <0.03)
Troponin I: 0.1 ng/mL (ref <0.03)

## 2018-09-10 LAB — URINALYSIS, ROUTINE W REFLEX MICROSCOPIC
BILIRUBIN URINE: NEGATIVE
Glucose, UA: NEGATIVE mg/dL
Hgb urine dipstick: NEGATIVE
Ketones, ur: NEGATIVE mg/dL
LEUKOCYTES UA: NEGATIVE
NITRITE: NEGATIVE
Protein, ur: 100 mg/dL — AB
Specific Gravity, Urine: 1.017 (ref 1.005–1.030)
Squamous Epithelial / HPF: >50 — ABNORMAL HIGH (ref 0–5)
pH: 5 (ref 5.0–8.0)

## 2018-09-10 LAB — HEMOGLOBIN
HEMOGLOBIN: 9.7 g/dL — AB (ref 12.0–15.0)
Hemoglobin: 10.7 g/dL — ABNORMAL LOW (ref 12.0–15.0)

## 2018-09-10 MED ORDER — DEXTROSE 50 % IV SOLN
12.5000 g | INTRAVENOUS | Status: AC
  Administered 2018-09-10: 12.5 g via INTRAVENOUS

## 2018-09-10 NOTE — Consult Note (Addendum)
Consultation  Referring Provider: Dr. Maylene Roes    Primary Care Physician:  Bernerd Limbo, MD Primary Gastroenterologist:   Dr. [name] (seen by Dr. Rush Landmark lately) Reason for Consultation: Recurrent pancreatitis            HPI:   [name] is a 68 y.o. female with a past medical history significant for AAA (status post repair in May 2019), heart failure, CAD (on Plavix), A. Fib, ESRD on HD, peripheral vascular disease, carotid artery disease, hypertension, diabetes, severe pancreatitis (unclear etiology though presumed to have been gallstone related and reason for gallbladder to be resected but complicated in the sense of peripancreatic fluid collection development), who presented to the ER on 09/09/2018 with abdominal pain.    Today, the patient explains that after she had her CT scan of the abdomen and pelvis 09/08/18, when she returned home she had a sloppy Joe and about 30 minutes later had severe epigastric pain which wrapped around her entire abdomen and into her back rated as a 10/10 which "just made me want a run somewhere", this was sharp and "intense".  She then started with nausea and vomiting describing at least 3-4 episodes of vomiting blood/black material, last the morning of 09/09/2018.  Currently the pain is still a 10/10 if she does not receive her pain medicine.  Denies any other acute changes in her health.     Denies fever, chills, weight loss, anorexia or melena.  She has not had a bowel movement for 2 days.    ED course: Lipase 2164 (198 on 08/19/2018), potassium 3.1, creatinine 3.49, troponin 0 0.09, white blood cell count 20.5, lactic acid 1.65  GI history: 08/14/2018-office visit Dr. Rush Landmark: (See his comprehensive office visit note) patient developed pancreatitis in August 2019 presented to gallbladder origin underwent cholecystectomy, pathology consistent with chronic cholecystitis, readmitted in September with recurrent pancreatitis, has been having manifest lipase  elevations, multiple imaging studies noncontrast due to chronic renal insufficiency showed development of peripancreatic fluid collections as well as at one point concern for bleeding into the fluid collection, eventually required Dobbhoff feeding tube placement for a few weeks and then developed issues with GI bleeding that were result of ulcer disease likely related to her Dobbhoff being placed, eventually discharged with multiple return to the hospital.  During recent mission patient was consulted in setting of cystic duct stone on MRI with concern this is causing her current symptoms, case discussed with [facility] who felt cystic duct stone was not currently etiology of her issues, had growing/developing peripancreatic fluid collection and IR felt did not need to be sampled, readmitted in Thanksgiving for recurrent pain-at time of that office visit patient continued to be weak with significant anasarca of lower extremities with a 10 to 12 pound weight gain, Lasix increased by PCP; plan: At that time concern about anasarca and kidney function, labs were obtained, repeat CT ordered in a few weeks, then to consider role of endoscopic ultrasound, CT with contrast with preferred if patient required dialysis or became dialysis dependent  GI procedures and studies: October 2019 EGD - Two non-bleeding esophageal ulcer with evidence for recent bleeding - likely due to Dobbhoff tube. - Clotted blood in the cardia, in the gastric fundus and in the gastric body limiting a full evaluation. - Large clot on the tip of the Dobbhoff had limited prior removal. The clot was partially removed and the Dobbhoff successfully removed. - Normal examined duodenum. - No specimens collected. - No  active bleeding was noted during this procedure.  August 2019 EGD - Normal stomach. - Mucosal changes in the stomach and duodenum. - The examination was otherwise normal. - No specimens collected. No active  bleeding.  Imaging: 09/08/2018 CT abdomen pelvis without contrast 1.  Some interval improvement of the probable group pancreatitis and inflammation and fluid surrounding the descending duodenum.  No discrete pancreatic or duodenal mass. 2.  Status post cholecystectomy with mild biliary dilatation. 3.  Advanced atherosclerotic disease involving the aorta and iliac arteries.  November 2019 CT abdomen pelvis without contrast IMPRESSION: 1. Slightly hyperdense complex process spanning over 8 cm centered at the pancreatic head and duodenal sweep has not improved. This is causing significant narrowing and thickening of the duodenum. It is possible this is a result of hemorrhagic pancreatitis given the hyperdense appearance. This does not have typical appearance of a pancreatic pseudocyst. Hematoma, infection or biloma are other considerations. 2. Interval clearing of previously noted left lower lobe consolidation. Septal thickening and small pleural effusions greater on right may reflect changes of mild fluid overload given the diffuse third spacing of fluid. 3. Post cholecystectomy. 6 mm stone in gallbladder stump. 4. Atherosclerotic changes aorta. Post endoluminal stenting. Native aortic aneurysm measures up to 4.9 cm without change. 5. Complex fluid collection adjacent to the lateral border of the left psoas muscle without change. This measures up to 5 cm.  July 12, 2018 HIDA IMPRESSION: 1. No evidence of bile leak or obstruction. 2. Patent common bile duct. 3. Prominent cystic duct remnant as seen on comparison MRI.  November 2019 MRI/MRCP IMPRESSION: Prior cholecystectomy. Dilated cystic duct stump in gallbladder fossa containing a 5 mm calculus. No evidence of biliary ductal dilatation or choledocholithiasis. Mild decrease in size of heterogeneous "mass" with internal cystic foci and surrounding inflammatory changes between the descending duodenum and pancreatic head. This  may represent a biloma, abscess, or evolving pancreatic pseudocyst. Small amount of fluid in gallbladder fossa and upper abdomen; postop bile leak cannot be excluded. Consider nuclear medicine hepatobiliary scan for further evaluation. Decreased bowel wall thickening involving the hepatic flexure of colon. Aortic stent graft with stable size of 4.9 cm native abdominal aortic aneurysm. Hemosiderosis.  Past Medical History:  Diagnosis Date  . AAA (abdominal aortic aneurysm) ([place]) 5/09   3.7x3.3 by u/s 2009  . Abnormal EKG    deep TW inversions chronic  . Anemia   . CAD (coronary artery disease)    a. CABG 03/2011 LIMA to LAD, SVG to OM, SVG to PDA. b. cath 06/25/2015 DES to SVG to rPDA, patent LIMA to LAD, patent SVG to OM  . carotid stenosis 5/09   S/p L CEA ;  60-79% bilat ICA by preCABG dopplers 7/12  . CHF (congestive heart failure) ([place])   . Chronic back pain    "all over" (06/24/2015)  . Chronic bronchitis ([place])    "get it pretty much q yr" (06/24/2015)  . CKD (chronic kidney disease)   . Complication of anesthesia 1966   "problem w/ether"  . COPD (chronic obstructive pulmonary disease) ([place])   . Depression   . GERD (gastroesophageal reflux disease)    With hiatal hernia  . GIB (gastrointestinal bleeding) 9/11   S/p EGD with cautery at [facility]  . Heart murmur   . History of blood transfusion "a few times"   "all related to anemia" (06/24/2015)  . History of hiatal hernia   . Hyperlipidemia   . Hypertension   . Mitral regurgitation  3+ MR by intraoperative TEE;  s/p MV repair with Dr. Roxy Manns 7/12  . Myocardial infarction [facility]) 2012 "several"  . On home oxygen therapy    "2 liters at night; negative for sleep apnea"  . PAD (peripheral artery disease) ([facility])    Severe; s/p bilateral renal artery stents, moderate in-stent restenosis  . Pancreatitis 05/2018  . Paroxysmal atrial fibrillation ([facility])    coumadin;  echo 9/07: EF 60%, mild LVH;  s/p Cox Maze 7/12 with LAA  clipping  . Subclavian artery stenosis, left ([facility])    stented by Dr. Trula Slade on 10/18 to help flow of her LIMA to LAD  . Type II diabetes mellitus ([place])     Past Surgical History:  Procedure Laterality Date  . A/V FISTULAGRAM Left 03/21/2018   Procedure: A/V FISTULAGRAM;  Surgeon: Serafina Mitchell, MD;  Location: [facility];  Service: Cardiovascular;  Laterality: Left;  . ABDOMINAL AORTIC ENDOVASCULAR STENT GRAFT N/A 01/17/2018   Procedure: ABDOMINAL AORTIC ENDOVASCULAR STENT GRAFT WITH CO2;  Surgeon: Serafina Mitchell, MD;  Location: [facility];  Service: Vascular;  Laterality: N/A;  . ABDOMINAL HYSTERECTOMY  1990's  . APPENDECTOMY  Aug. 11, 2016   Ruptured  . AV FISTULA PLACEMENT Left 11/03/2017   Procedure: ARTERIOVENOUS (AV) FISTULA CREATION LEFT ARM;  Surgeon: Serafina Mitchell, MD;  Location: [facility];  Service: Vascular;  Laterality: Left;  . BOWEL RESECTION  2013  . CARDIAC CATHETERIZATION N/A 06/24/2015   Procedure: Left Heart Cath and Cors/Grafts Angiography;  Surgeon: Leonie Man, MD;  Location: [facility];  Service: Cardiovascular;  Laterality: N/A;  . CARDIAC CATHETERIZATION N/A 06/25/2015   Procedure: Coronary Stent Intervention;  Surgeon: Sherren Mocha, MD;  Location: [facility];  Service: Cardiovascular;  Laterality: N/A;  . CAROTID ENDARTERECTOMY Left   . CATARACT EXTRACTION Right 03/27/2018  . CHOLECYSTECTOMY N/A 04/19/2018   Procedure: LAPAROSCOPIC CHOLECYSTECTOMY WITH INTRAOPERATIVE CHOLANGIOGRAM;  Surgeon: Rolm Bookbinder, MD;  Location: [facility];  Service: General;  Laterality: N/A;  . CORONARY ANGIOPLASTY    . CORONARY ARTERY BYPASS GRAFT  03/05/2011   CABG X3 (LIMA to LAD, SVG to OM, SVG to PDA, EVH via left thigh  . CORONARY STENT PLACEMENT    . ESOPHAGOGASTRODUODENOSCOPY N/A 04/12/2018   Procedure: ESOPHAGOGASTRODUODENOSCOPY (EGD);  Surgeon: [name], MD;  Location: [facility];  Service: Endoscopy;  Laterality: N/A;  .  ESOPHAGOGASTRODUODENOSCOPY N/A 06/16/2018   Procedure: ESOPHAGOGASTRODUODENOSCOPY (EGD);  Surgeon: Thornton Park, MD;  Location: [facility];  Service: Gastroenterology;  Laterality: N/A;  . LACERATION REPAIR Right 1990's   WRIST  . MAZE  03/05/2011   complete biatrial lesion set with clipping of LA appendage  . MITRAL VALVE REPAIR  03/05/2011   57mm Memo 3D ring annuloplasty for ischemic MR  . PERIPHERAL VASCULAR BALLOON ANGIOPLASTY Left 03/21/2018   Procedure: PERIPHERAL VASCULAR BALLOON ANGIOPLASTY;  Surgeon: Serafina Mitchell, MD;  Location: [facility];  Service: Cardiovascular;  Laterality: Left;  . PERIPHERAL VASCULAR CATHETERIZATION N/A 06/24/2015   Procedure: Aortic Arch Angiography;  Surgeon: Serafina Mitchell, MD;  Location: [facility];  Service: Cardiovascular;  Laterality: N/A;  . PERIPHERAL VASCULAR CATHETERIZATION  06/24/2015   Procedure: Peripheral Vascular Intervention;  Surgeon: Serafina Mitchell, MD;  Location: [facility];  Service: Cardiovascular;;  . PERIPHERAL VASCULAR CATHETERIZATION N/A 06/24/2015   Procedure: Abdominal Aortogram;  Surgeon: Serafina Mitchell, MD;  Location: [facility];  Service: Cardiovascular;  Laterality:  N/A;  . RENAL ANGIOGRAM Bilateral 09/11/2013   Procedure: RENAL ANGIOGRAM;  Surgeon: Serafina Mitchell, MD;  Location: [facility] LAB;  Service: Cardiovascular;  Laterality: Bilateral;  . RIGHT FEMORAL-POPLITEAL BYPASS      Family History  Problem Relation Age of Onset  . Emphysema Mother   . Heart disease Mother        before age 66  . Hypertension Mother   . Hyperlipidemia Mother   . Heart attack Mother   . AAA (abdominal aortic aneurysm) Mother        rupture  . Heart attack Father 53  . Emphysema Father   . Heart disease Father        before age 51  . Hyperlipidemia Father   . Hypertension Father   . Peripheral vascular disease Father   . Diabetes Brother   . Heart disease Brother        before age 43  .  Hyperlipidemia Brother   . Hypertension Brother   . Heart attack Brother        CABG  . Heart disease Other        Vascular disease in grandparents, uncles and dad  . Colon cancer Neg Hx   . Esophageal cancer Neg Hx   . Inflammatory bowel disease Neg Hx   . Liver disease Neg Hx   . Pancreatic cancer Neg Hx   . Rectal cancer Neg Hx   . Stomach cancer Neg Hx     Social History   Tobacco Use  . Smoking status: Current Every Day Smoker    Years: 50.00    Types: Cigarettes  . Smokeless tobacco: Never Used  . Tobacco comment: 3/4 pk per day  Substance Use Topics  . Alcohol use: Yes    Alcohol/week: 0.0 standard drinks    Comment: occasionally  . Drug use: Yes    Types: Marijuana    Prior to Admission medications   Medication Sig Start Date End Date Taking? Authorizing Provider  acetaminophen (TYLENOL) 325 MG tablet Take 325 mg by mouth every 6 (six) hours as needed (for pain).   Yes [provider]  Albuterol Sulfate (PROAIR RESPICLICK) 622 (90 Base) MCG/ACT AEPB Inhale 1 puff into the lungs every 4 (four) hours as needed (shortness of breath/wheezing).   Yes [provider]  allopurinol (ZYLOPRIM) 100 MG tablet Take 100 mg by mouth daily. 02/02/17  Yes [provider]  Amino Acids-Protein Hydrolys (FEEDING SUPPLEMENT, PRO-STAT SUGAR FREE 64,) LIQD Take 30 mLs by mouth 3 (three) times daily with meals. 06/26/18  Yes Thurnell Lose, MD  amLODipine (NORVASC) 5 MG tablet Take 1 tablet (5 mg total) by mouth daily. 08/23/18  Yes Mikhail, Maryann, DO  atorvastatin (LIPITOR) 40 MG tablet Take 40 mg by mouth daily.  12/07/17  Yes [provider]  B Complex-C-Folic Acid (DIALYVITE PO) Take 1 tablet by mouth daily.   Yes [provider]  budesonide-formoterol (SYMBICORT) 160-4.5 MCG/ACT inhaler Inhale 2 puffs into the lungs 2 (two) times daily.    Yes [provider]  calcium carbonate (TUMS EX) 750 MG chewable tablet Chew 2 tablets by  mouth 3 (three) times daily as needed for heartburn.    Yes [provider]  carvedilol (COREG) 12.5 MG tablet Take 1 tablet (12.5 mg total) by mouth 2 (two) times daily with a meal. 06/26/18  Yes Thurnell Lose, MD  clopidogrel (PLAVIX) 75 MG tablet Take 1 tablet (75 mg total) by mouth  daily with breakfast. 06/26/15  Yes Meng, [place], [place]  Cyanocobalamin (VITAMIN B-12) 5000 MCG SUBL Place 5,000 mcg under the tongue daily.   Yes [provider]  feeding supplement (BOOST / RESOURCE BREEZE) LIQD Take 1 Container by mouth 2 (two) times daily.   Yes [provider]  fenofibrate 160 MG tablet Take 1 tablet (160 mg total) by mouth daily. 10/08/15  Yes Delfina Redwood, MD  gabapentin (NEURONTIN) 300 MG capsule Take 1 capsule (300 mg total) by mouth at bedtime. 08/22/18  Yes Mikhail, Velta Addison, DO  hydrALAZINE (APRESOLINE) 25 MG tablet Take 1 tablet (25 mg total) by mouth every 8 (eight) hours. 06/26/18  Yes Thurnell Lose, MD  HYDROmorphone (DILAUDID) 2 MG tablet Take 2 mg by mouth every 4 (four) hours.    Yes [provider]  lipase/protease/amylase 24000-76000 units CPEP Take 1 capsule (24,000 Units total) by mouth 3 (three) times daily with meals. 08/22/18  Yes Mikhail, [place], DO  loperamide (IMODIUM A-D) 2 MG tablet Take 2 mg by mouth 4 (four) times daily as needed for diarrhea or loose stools.   Yes [provider]  OXYGEN Inhale into the lungs at bedtime.   Yes [provider]  pantoprazole (PROTONIX) 40 MG tablet Take 1 tablet (40 mg total) by mouth daily. 08/03/18  Yes Patrecia Pour, MD  polyethylene glycol (MIRALAX / GLYCOLAX) packet Take 17 g by mouth daily as needed for mild constipation. 06/26/18  Yes Thurnell Lose, MD  albuterol (PROVENTIL HFA;VENTOLIN HFA) 108 (90 Base) MCG/ACT inhaler Inhale 2 puffs into the lungs every 4 (four) hours as needed for wheezing or shortness of breath. ProAir Patient not taking: Reported on 09/09/2018  10/08/15   Delfina Redwood, MD  ferrous sulfate 325 (65 FE) MG tablet Take 1 tablet (325 mg total) by mouth daily with breakfast. Patient not taking: Reported on 09/09/2018 07/19/18   Florencia Reasons, MD    Current Facility-Administered Medications  Medication Dose Route Frequency Provider Last Rate Last Dose  . 0.9 %  sodium chloride infusion   Intravenous Continuous Dessa Phi, DO 75 mL/hr at 09/10/18 0300    . 0.9 %  sodium chloride infusion  100 mL Intravenous PRN Finnigan, Nancy A, DO      . 0.9 %  sodium chloride infusion  100 mL Intravenous PRN Finnigan, Nancy A, DO      . amLODipine (NORVASC) tablet 5 mg  5 mg Oral Daily Dessa Phi, DO   5 mg at 09/10/18 2440  . atorvastatin (LIPITOR) tablet 40 mg  40 mg Oral q1800 Dessa Phi, DO      . carvedilol (COREG) tablet 12.5 mg  12.5 mg Oral BID WC Dessa Phi, DO   12.5 mg at 09/10/18 1027  . Chlorhexidine Gluconate Cloth 2 % PADS 6 each  6 each Topical Q0600 Finnigan, Nancy A, DO      . clopidogrel (PLAVIX) tablet 75 mg  75 mg Oral Q breakfast Dessa Phi, DO   75 mg at 09/10/18 2536  . heparin injection 1,000 Units  1,000 Units Dialysis PRN Finnigan, Izora Gala A, DO      . heparin injection 5,000 Units  5,000 Units Subcutaneous Q8H Dessa Phi, DO   5,000 Units at 09/10/18 [phone]  . hydrALAZINE (APRESOLINE) tablet 25 mg  25 mg Oral Q8H Dessa Phi, DO   25 mg at 09/10/18 0644  . HYDROmorphone (DILAUDID) injection 1 mg  1 mg Intravenous Q2H PRN Dessa Phi, DO  1 mg at 09/10/18 0921  . insulin aspart (novoLOG) injection 0-9 Units  0-9 Units Subcutaneous TID WC Dessa Phi, DO      . lidocaine (PF) (XYLOCAINE) 1 % injection 5 mL  5 mL Intradermal PRN Finnigan, Nancy A, DO      . lidocaine-prilocaine (EMLA) cream 1 application  1 application Topical PRN Finnigan, Nancy A, DO      . lipase/protease/amylase (CREON) capsule 24,000 Units  24,000 Units Oral TID WC Dessa Phi, DO   24,000 Units at 09/10/18 9767  .  mometasone-formoterol (DULERA) 200-5 MCG/ACT inhaler 2 puff  2 puff Inhalation BID Dessa Phi, DO      . ondansetron [facility]) tablet 4 mg  4 mg Oral Q6H PRN Dessa Phi, DO       Or  . ondansetron [facility]) injection 4 mg  4 mg Intravenous Q6H PRN Dessa Phi, DO      . pantoprazole (PROTONIX) EC tablet 40 mg  40 mg Oral Daily Dessa Phi, DO   40 mg at 09/10/18 3419  . pentafluoroprop-tetrafluoroeth (GEBAUERS) aerosol 1 application  1 application Topical PRN Finnigan, Nancy A, DO        Allergies as of 09/09/2018 - Review Complete 09/09/2018  Allergen Reaction Noted  . Isosorbide Other (See Comments) 10/28/2015  . Isosorbide nitrate Other (See Comments) 10/28/2015  . Oxycodone-acetaminophen Itching 10/28/2015  . Codeine Itching 10/28/2015  . Contrast media [iodinated diagnostic agents] Itching and Other (See Comments) 05/29/2012  . Ioxaglate Itching and Other (See Comments) 02/24/2015  . Metrizamide Itching and Other (See Comments) 07/03/2014  . Percocet [oxycodone-acetaminophen] Itching and Other (See Comments) 09/04/2013     Review of Systems:    Constitutional: No weight loss, fever or chills Skin: No rash Cardiovascular: +chest pain Respiratory: No SOB  Gastrointestinal: See HPI and otherwise negative Genitourinary: No dysuria  Neurological: No headache, dizziness or syncope Musculoskeletal: No new muscle or joint pain Hematologic:+hematemesis Psychiatric: No history of depression or anxiety    Physical Exam:  Vital signs in last 24 hours: Temp:  [97.9 F (36.6 C)-98.8 F (37.1 C)] 98.8 F (37.1 C) (01/05 0446) Pulse Rate:  [69-81] 81 (01/05 0446) Resp:  [12-23] 18 (01/05 0446) BP: (159-198)/(58-85) 159/58 (01/05 0446) SpO2:  [93 %-97 %] 94 % (01/05 0446) Last BM Date: 09/07/18 General:   Pleasant ill, frail appearing Caucasian female appears to be in mild distress, Well developed, Well nourished, alert and cooperative Head:  Normocephalic and  atraumatic. Eyes:   PEERL, EOMI. No icterus. Conjunctiva pink. Ears:  Normal auditory acuity. Neck:  Supple Throat: Oral cavity and pharynx without inflammation, swelling or lesion.  Lungs: Respirations even and unlabored. Lungs clear to auscultation bilaterally.   No wheezes, crackles, or rhonchi.  Heart: Normal S1, S2. No MRG. Regular rate and rhythm. No peripheral edema, cyanosis or pallor.  Abdomen:  Soft, nondistended, marked ttp in the epigastrum with some involuntary guarding.Normal bowel sounds. No appreciable masses or hepatomegaly. Rectal:  Not performed.  Msk:  Symmetrical without gross deformities. Peripheral pulses intact.  Extremities:  Without edema, no deformity or joint abnormality. Neurologic:  Alert and  oriented x4;  grossly normal neurologically.  Skin:   Dry and intact without significant lesions or rashes. Psychiatric: Demonstrates good judgement and reason without abnormal affect or behaviors.   LAB RESULTS: Recent Labs    09/09/18 1424 09/10/18 0118  WBC 20.5* 24.4*  HGB 11.7* 10.9*  HCT 38.4 34.6*  PLT 351 274   BMET Recent Labs  09/09/18 1424 09/10/18 0118  NA 137 138  K 3.1* 3.2*  CL 96* 100  CO2 27 21*  GLUCOSE 164* 104*  BUN 41* 49*  CREATININE 3.49* 3.75*  CALCIUM 9.4 8.6*   LFT Recent Labs    09/09/18 1424  PROT 6.5  ALBUMIN 3.3*  AST 22  ALT 11  ALKPHOS 46  BILITOT 1.1   STUDIES: Ct Abdomen Pelvis Wo Contrast  Result Date: 09/08/2018 CLINICAL DATA:  History of pancreatitis. EXAM: CT ABDOMEN AND PELVIS WITHOUT CONTRAST TECHNIQUE: Multidetector CT imaging of the abdomen and pelvis was performed following the standard protocol without IV contrast. COMPARISON:  CT scan 08/01/2018 and MRI abdomen 07/10/2018 FINDINGS: Lower chest: The lung bases are grossly clear. No acute infiltrates or effusions. The heart is enlarged but stable. Stable coronary artery calcifications and aortic calcifications. Hepatobiliary: No focal hepatic lesions  or intrahepatic biliary dilatation. The gallbladder is surgically absent. Mild associated common bile duct dilatation. Pancreas: Improved inflammation and edema involving the pancreatic head and adjacent second portion the duodenum. There is some fluid between the duodenum and the liver but this is slightly smaller. Findings likely represent changes of resolving groove pancreatitis. No findings for duodenal obstruction. No pancreatic ductal dilatation. Spleen: Normal size.  No focal lesions. Adrenals/Urinary Tract: The adrenal glands and kidneys are unremarkable and stable. Stable extensive renal artery calcifications. No hydronephrosis. The bladder is grossly normal. Stomach/Bowel: The stomach, duodenum, small bowel and colon are grossly normal. Stable surgical changes involving the sigmoid colon. No acute inflammatory changes, mass lesions or obstructive findings. Vascular/Lymphatic: Stable severe atherosclerotic calcifications involving the aorta, branch vessels and iliac arteries. The patient has a stable appearing aortoiliac stent graft without complicating features. No mesenteric or retroperitoneal mass or adenopathy. Small scattered lymph nodes are stable. Reproductive: Surgically absent. Other: No pelvic mass or free pelvic fluid collections. No inguinal mass or adenopathy. Musculoskeletal: No significant bony findings. IMPRESSION: 1. Some interval improvement of the probable groove pancreatitis and inflammation and fluid surrounding the descending duodenum. No discrete pancreatic or duodenal mass is identified. 2. Status post cholecystectomy with mild biliary dilatation. 3. Advanced atherosclerotic disease involving the aorta and iliac arteries. Electronically Signed   By: Marijo Sanes M.D.   On: 09/08/2018 15:40   Dg Acute Abd 2+v Abd (supine,erect,decub) + 1v Chest  Result Date: 09/09/2018 CLINICAL DATA:  Abdominal pain and vomiting.  Missed dialysis today. EXAM: DG ABDOMEN ACUTE W/ 1V CHEST  COMPARISON:  09/08/2018 FINDINGS: Mild enlargement of the cardiopericardial silhouette with interstitial accentuation and Kerley B lines favoring acute interstitial pulmonary edema. Prior CABG. Prosthetic mitral valve. Atrial clip. Atherosclerotic calcification of the aortic arch. Aorta bi-iliac stent grafts traversing a rim calcified abdominal aortic aneurysm. Bilateral renal stents. Staple line along the rectum. Metal structure probably outside of the patient projects over the left lower pelvis. There is gas in the colon and in the stomach, small bowel is relatively gasless. No dilated bowel. IMPRESSION: 1. Interstitial pulmonary edema.  Mild cardiomegaly 2. No abnormality of the bowel gas pattern is identified although the small bowel is relatively gasless which can reduce negative predictive value. 3. Atherosclerosis. Aorta bi-iliac stent graft traverses an abdominal aortic aneurysm. Electronically Signed   By: Van Clines M.D.   On: 09/09/2018 17:05    Impression / Plan:   Impression: 1.  Acute recurrent pancreatitis: Lipase now 2164, recent CT of the abdomen pelvis without contrast 09/08/2018 with some interval improvement of pancreatitis inflammation of fluid surrounding the descending duodenum,  now with leukocytosis 2.  ESRD on dialysis 3.  Demand ischemia 4.  CAD: On Plavix 5.  Chronic hypoxemic respiratory failure COPD: Uses 2 L nasal cannula O2 at night 6.  Chronic diastolic dysfunction: 01/02/7680 echo with an EF of 15-72%, grade 2 diastolic dysfunction 7.  Coffee-ground emesis: Patient reports episode of coffee-ground emesis x4 on 09/08/2018-09/09/2018, last EGD in October with some esophageal ulcer thought related to Dobbhoff tube; concern for upper GI bleed  Plan: 1.  Ordered repeat hemoglobin, last 10.9, we will see if this is trending down this morning, if so patient may require EGD during admission. 2.  Still on Plavix this admission-may need to hold pending signs of GI bleed 3.   Continue aggressive fluid therapy, pain meds and antiemetics 4.  Will allow mouth swabs for patient's dry mouth, otherwise patient to remain NPO 5.  Please await any further recommendations from Dr. Bryan Lemma later today.  Thank you for your kind consultation, we will continue to follow.  Lavone Nian [name]  09/10/2018, 9:56 AM

## 2018-09-10 NOTE — Plan of Care (Signed)
  Problem: Clinical Measurements: Goal: Complications related to the disease process, condition or treatment will be avoided or minimized Outcome: Progressing   

## 2018-09-10 NOTE — Progress Notes (Signed)
PROGRESS NOTE    [name]  ERX:[id number] DOB: 05-Jun-1951 DOA: 09/09/2018 PCP: Bernerd Limbo, MD     Brief Narrative:  [name] is a 68 y.o. female with medical history significant of ESRD on dialysis, abdominal aortic aneurysm status post repair, CAD who was recently admitted for pancreatitis returns again for abdominal pain.  Briefly, she was diagnosed with gallstone pancreatitis in August 2019, underwent cholecystectomy, has been repeatedly admitted for recurrent pancreatitis, pseudocyst.  She had a follow-up CT abdomen pelvis yesterday afternoon as an outpatient which revealed interval improvement of pancreatitis and inflammation and fluid surrounding the descending duodenum.  She states that after her CT scan, she started having worsening epigastric abdominal pain, nausea, vomiting. She denies any fevers, chills or shortness of breath.  She does admit to some central chest pain that is sharp in nature, radiating from her abdomen.  No worsening peripheral edema.  She was admitted for recurrent pancreatitis.  New events last 24 hours / Subjective: She still has epigastric abdominal pain.  No further vomiting since admission.  She states that she does not know how she will tolerate dialysis with this pain level.  Advised that she may be able to get increased frequency of Dilaudid IV during dialysis to manage her pain.  Assessment & Plan:   Principal Problem:   Acute recurrent pancreatitis Active Problems:   Essential hypertension   Coronary artery disease involving native heart with angina pectoris (HCC)   Chronic diastolic heart failure (HCC)   Diabetes mellitus (HCC)   Chronic diastolic congestive heart failure (HCC)   ESRD (end stage renal disease) ([place])   Acute recurrent pancreatitis, groove pancreatitis  -Complicated history of initially presumed gallstone pancreatitis status post cholecystectomy in August 2019 with subsequent recurrent pancreatitis requiring hospital  admissions.  She has been followed by Dr. Rush Landmark, [facility], and underwent CT abdomen pelvis without contrast as an outpatient on 09/08/18 which revealed some interval improvement of pancreatitis and inflammation of fluid surrounding the descending duodenum.  No discrete pancreaticoduodenal mass was found. -NPO  -IVF stopped per Nephro rec -GI following   Coffee-ground emesis -Trend H&H, PPI -GI following  -Hold Plavix if recurs  ESRD on dialysis TTS -Nephrology following, plan for HD tmrw   Demand ischemia -Troponin with flat trend 0.09, 0.11, 0.10  Coronary artery disease -Continue Plavix, statin  Type 2 diabetes -SSI  HTN -Continue norvasc, hydralazine   Chronic hypoxemic respiratory failure with COPD -At baseline uses 2 L nasal cannula O2 at nighttime  Chronic diastolic dysfunction -Echocardiogram 05/30/2018 shows an EF of 60 to 95%, grade 2 diastolic dysfunction -Continue coreg    DVT prophylaxis: Subq hep Code Status: Full Family Communication: No family at bedside Disposition Plan: Pending improvement in epigastric pain   Consultants:   GI  Nephrology  Procedures:   None   Antimicrobials:  Anti-infectives (From admission, onward)   None        Objective: Vitals:   09/09/18 1931 09/09/18 2100 09/10/18 0446 09/10/18 1345  BP: (!) 190/79 (!) 179/85 (!) 159/58 (!) 148/45  Pulse: 80 75 81 73  Resp: 18 18 18    Temp: 98.6 F (37 C) 98.4 F (36.9 C) 98.8 F (37.1 C) 98.9 F (37.2 C)  TempSrc: Oral Oral Oral Oral  SpO2: 94% 96% 94% 90%    Intake/Output Summary (Last 24 hours) at 09/10/2018 1440 Last data filed at 09/10/2018 0835 Gross per 24 hour  Intake 491.85 ml  Output 50 ml  Net  441.85 ml   There were no vitals filed for this visit.  Examination:  General exam: Appears calm, uncomfortable Respiratory system: Clear to auscultation. Respiratory effort normal. Cardiovascular system: S1 & S2 heard, RRR. No JVD, murmurs, rubs,  gallops or clicks. No pedal edema. Gastrointestinal system: Abdomen is nondistended, soft and tender to palpation in epigastric region Central nervous system: Alert and oriented. No focal neurological deficits. Extremities: Symmetric 5 x 5 power. Skin: No rashes, lesions or ulcers Psychiatry: Judgement and insight appear normal. Mood & affect appropriate.   Data Reviewed: I have personally reviewed following labs and imaging studies  CBC: Recent Labs  Lab 09/09/18 1424 09/10/18 0118 09/10/18 1026  WBC 20.5* 24.4*  --   HGB 11.7* 10.9* 10.7*  HCT 38.4 34.6*  --   MCV 94.1 94.5  --   PLT 351 274  --    Basic Metabolic Panel: Recent Labs  Lab 09/09/18 1424 09/10/18 0118  NA 137 138  K 3.1* 3.2*  CL 96* 100  CO2 27 21*  GLUCOSE 164* 104*  BUN 41* 49*  CREATININE 3.49* 3.75*  CALCIUM 9.4 8.6*   GFR: CrCl cannot be calculated (Unknown ideal weight.). Liver Function Tests: Recent Labs  Lab 09/09/18 1424  AST 22  ALT 11  ALKPHOS 46  BILITOT 1.1  PROT 6.5  ALBUMIN 3.3*   Recent Labs  Lab 09/09/18 1424  LIPASE 2,164*   No results for input(s): AMMONIA in the last 168 hours. Coagulation Profile: No results for input(s): INR, PROTIME in the last 168 hours. Cardiac Enzymes: Recent Labs  Lab 09/09/18 1918 09/10/18 0118 09/10/18 0713  TROPONINI 0.09* 0.11* 0.10*   BNP (last 3 results) No results for input(s): PROBNP in the last 8760 hours. HbA1C: No results for input(s): HGBA1C in the last 72 hours. CBG: Recent Labs  Lab 09/09/18 2317 09/10/18 0400 09/10/18 0754 09/10/18 1213  GLUCAP 94 100* 82 77   Lipid Profile: No results for input(s): CHOL, HDL, LDLCALC, TRIG, CHOLHDL, LDLDIRECT in the last 72 hours. Thyroid Function Tests: No results for input(s): TSH, T4TOTAL, FREET4, T3FREE, THYROIDAB in the last 72 hours. Anemia Panel: No results for input(s): VITAMINB12, FOLATE, FERRITIN, TIBC, IRON, RETICCTPCT in the last 72 hours. Sepsis Labs: Recent  Labs  Lab 09/09/18 1634  LATICACIDVEN 1.65    Recent Results (from the past 240 hour(s))  Blood culture (routine x 2)     Status: None (Preliminary result)   Collection Time: 09/09/18  4:02 PM  Result Value Ref Range Status   Specimen Description BLOOD RIGHT ANTECUBITAL  Final   Special Requests   Final    BOTTLES DRAWN AEROBIC AND ANAEROBIC Blood Culture results may not be optimal due to an excessive volume of blood received in culture bottles   Culture NO GROWTH < 24 HOURS  Final   Report Status PENDING  Incomplete  Blood culture (routine x 2)     Status: None (Preliminary result)   Collection Time: 09/09/18  7:18 PM  Result Value Ref Range Status   Specimen Description BLOOD RIGHT ANTECUBITAL  Final   Special Requests   Final    BOTTLES DRAWN AEROBIC ONLY Blood Culture adequate volume Performed at [facility], [address] [place], [place] [address]    Culture NO GROWTH < 12 HOURS  Final   Report Status PENDING  Incomplete       Radiology Studies: Dg Acute Abd 2+v Abd (supine,erect,decub) + 1v Chest  Result Date: 09/09/2018 CLINICAL  DATA:  Abdominal pain and vomiting.  Missed dialysis today. EXAM: DG ABDOMEN ACUTE W/ 1V CHEST COMPARISON:  09/08/2018 FINDINGS: Mild enlargement of the cardiopericardial silhouette with interstitial accentuation and Kerley B lines favoring acute interstitial pulmonary edema. Prior CABG. Prosthetic mitral valve. Atrial clip. Atherosclerotic calcification of the aortic arch. Aorta bi-iliac stent grafts traversing a rim calcified abdominal aortic aneurysm. Bilateral renal stents. Staple line along the rectum. Metal structure probably outside of the patient projects over the left lower pelvis. There is gas in the colon and in the stomach, small bowel is relatively gasless. No dilated bowel. IMPRESSION: 1. Interstitial pulmonary edema.  Mild cardiomegaly 2. No abnormality of the bowel gas pattern is identified although the small bowel is relatively  gasless which can reduce negative predictive value. 3. Atherosclerosis. Aorta bi-iliac stent graft traverses an abdominal aortic aneurysm. Electronically Signed   By: Van Clines M.D.   On: 09/09/2018 17:05      Scheduled Meds: . amLODipine  5 mg Oral Daily  . atorvastatin  40 mg Oral q1800  . carvedilol  12.5 mg Oral BID WC  . Chlorhexidine Gluconate Cloth  6 each Topical Q0600  . clopidogrel  75 mg Oral Q breakfast  . heparin  5,000 Units Subcutaneous Q8H  . hydrALAZINE  25 mg Oral Q8H  . insulin aspart  0-9 Units Subcutaneous TID WC  . lipase/protease/amylase  24,000 Units Oral TID WC  . mometasone-formoterol  2 puff Inhalation BID  . pantoprazole  40 mg Oral Daily   Continuous Infusions: . sodium chloride    . sodium chloride       LOS: 1 day    Time spent: 25 minutes   Dessa Phi, DO [facility] [email or web address] Password [email or web address] 09/10/2018, 2:40 PM

## 2018-09-10 NOTE — Progress Notes (Signed)
[facility] Progress Note   Subjective:  Having sig abd discomfort. Nauseated.  Denies CP/SOB.   Objective Vitals:   09/09/18 1815 09/09/18 1931 09/09/18 2100 09/10/18 0446  BP: (!) 186/79 (!) 190/79 (!) 179/85 (!) 159/58  Pulse: 81 80 75 81  Resp: 15 18 18 18   Temp:  98.6 F (37 C) 98.4 F (36.9 C) 98.8 F (37.1 C)  TempSrc:  Oral Oral Oral  SpO2: 96% 94% 96% 94%   Physical Exam General: Elderly female uncomfortable NAD  Heart: RRR Lungs: CTAB  Abdomen: soft diffusely tender to palpation  Extremities: 1+ LE edema  Dialysis Access: LUE AVF +bruit   Dialysis Orders:  [facility]  TTS 4h 400/800 EDW 53kg 2K/2Ca bath  L AVF Heparin 2200  Mircera 23mcg IV 2 weeks (last 12/23) No VDRA   Assessment/Plan: 1. Recurrent pancreatitis. Lipase 2164. GI following.  2. ESRD - Recent HD start. HD TTS. Missed HD Saturday. No urgent indications for HD today. Will plan next HD 1/6. Use 4K bath. K+ 3.2  3. HTN/volume - BP up.Continue home meds.  some volume on exam. Careful IVFs in ESRD patient  4. Anemia - Hgb 10.9. No ESA needs currently. Follow trends.  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    Lynnda Child PA-C [facility] Pager [phone] 09/10/2018,12:14 PM  LOS: 1 day   Additional Objective Labs: Basic Metabolic Panel: Recent Labs  Lab 09/09/18 1424 09/10/18 0118  NA 137 138  K 3.1* 3.2*  CL 96* 100  CO2 27 21*  GLUCOSE 164* 104*  BUN 41* 49*  CREATININE 3.49* 3.75*  CALCIUM 9.4 8.6*   CBC: Recent Labs  Lab 09/09/18 1424 09/10/18 0118 09/10/18 1026  WBC 20.5* 24.4*  --   HGB 11.7* 10.9* 10.7*  HCT 38.4 34.6*  --   MCV 94.1 94.5  --   PLT 351 274  --    Blood Culture    Component Value Date/Time   SDES BLOOD RIGHT ANTECUBITAL 09/09/2018 1918   SPECREQUEST  09/09/2018 1918    BOTTLES DRAWN AEROBIC ONLY Blood Culture adequate volume Performed at [facility], [place] [address], [place], [place] [address]    CULT NO GROWTH <  12 HOURS 09/09/2018 1918   REPTSTATUS PENDING 09/09/2018 1918    Cardiac Enzymes: Recent Labs  Lab 09/09/18 1918 09/10/18 0118 09/10/18 0713  TROPONINI 0.09* 0.11* 0.10*   CBG: Recent Labs  Lab 09/09/18 2317 09/10/18 0400 09/10/18 0754  GLUCAP 94 100* 82   Iron Studies: No results for input(s): IRON, TIBC, TRANSFERRIN, FERRITIN in the last 72 hours. Lab Results  Component Value Date   INR 1.28 06/15/2018   INR 1.13 05/28/2018   INR 1.24 04/09/2018   Medications: . sodium chloride 75 mL/hr at 09/10/18 0300  . sodium chloride    . sodium chloride     . amLODipine  5 mg Oral Daily  . atorvastatin  40 mg Oral q1800  . carvedilol  12.5 mg Oral BID WC  . Chlorhexidine Gluconate Cloth  6 each Topical Q0600  . clopidogrel  75 mg Oral Q breakfast  . heparin  5,000 Units Subcutaneous Q8H  . hydrALAZINE  25 mg Oral Q8H  . insulin aspart  0-9 Units Subcutaneous TID WC  . lipase/protease/amylase  24,000 Units Oral TID WC  . mometasone-formoterol  2 puff Inhalation BID  . pantoprazole  40 mg Oral Daily

## 2018-09-11 ENCOUNTER — Other Ambulatory Visit (HOSPITAL_COMMUNITY): Payer: Self-pay

## 2018-09-11 ENCOUNTER — Encounter (HOSPITAL_COMMUNITY): Payer: Self-pay | Admitting: General Practice

## 2018-09-11 ENCOUNTER — Ambulatory Visit: Payer: Self-pay | Admitting: Surgery

## 2018-09-11 ENCOUNTER — Other Ambulatory Visit: Payer: Self-pay

## 2018-09-11 LAB — BASIC METABOLIC PANEL
Anion gap: 17 — ABNORMAL HIGH (ref 5–15)
BUN: 62 mg/dL — ABNORMAL HIGH (ref 8–23)
CO2: 17 mmol/L — ABNORMAL LOW (ref 22–32)
Calcium: 8 mg/dL — ABNORMAL LOW (ref 8.9–10.3)
Chloride: 102 mmol/L (ref 98–111)
Creatinine, Ser: 3.94 mg/dL — ABNORMAL HIGH (ref 0.44–1.00)
GFR calc Af Amer: 13 mL/min — ABNORMAL LOW (ref >60)
GFR calc non Af Amer: 11 mL/min — ABNORMAL LOW (ref >60)
Glucose, Bld: 76 mg/dL (ref 70–99)
Potassium: 3.3 mmol/L — ABNORMAL LOW (ref 3.5–5.1)
SODIUM: 136 mmol/L (ref 135–145)

## 2018-09-11 LAB — GLUCOSE, CAPILLARY
Glucose-Capillary: 64 mg/dL — ABNORMAL LOW (ref 70–99)
Glucose-Capillary: 72 mg/dL (ref 70–99)
Glucose-Capillary: 79 mg/dL (ref 70–99)
Glucose-Capillary: 107 mg/dL — ABNORMAL HIGH (ref 70–99)

## 2018-09-11 LAB — HEMOGLOBIN: Hemoglobin: 9.4 g/dL — ABNORMAL LOW (ref 12.0–15.0)

## 2018-09-11 MED ORDER — DEXTROSE 50 % IV SOLN
12.5000 g | INTRAVENOUS | Status: AC
  Administered 2018-09-11: 12.5 g via INTRAVENOUS

## 2018-09-11 MED ORDER — CHLORHEXIDINE GLUCONATE CLOTH 2 % EX PADS
6.0000 | MEDICATED_PAD | Freq: Every day | CUTANEOUS | Status: DC
  Administered 2018-09-12: 6 via TOPICAL

## 2018-09-11 MED ORDER — HEPARIN SODIUM (PORCINE) 1000 UNIT/ML IJ SOLN
INTRAMUSCULAR | Status: AC
  Administered 2018-09-11: 1000 [IU]
  Filled 2018-09-11: qty 1

## 2018-09-11 MED ORDER — HYDROMORPHONE HCL 1 MG/ML IJ SOLN
INTRAMUSCULAR | Status: AC
  Administered 2018-09-11: 1 mg via INTRAVENOUS
  Filled 2018-09-11: qty 1

## 2018-09-11 MED ORDER — OXYCODONE-ACETAMINOPHEN 5-325 MG PO TABS
1.0000 | ORAL_TABLET | Freq: Four times a day (QID) | ORAL | Status: DC | PRN
  Administered 2018-09-11 – 2018-09-13 (×2): 2 via ORAL
  Filled 2018-09-11 (×5): qty 2

## 2018-09-11 MED ORDER — ONDANSETRON HCL 4 MG/2ML IJ SOLN
INTRAMUSCULAR | Status: AC
  Filled 2018-09-11: qty 2

## 2018-09-11 MED ORDER — INSULIN ASPART 100 UNIT/ML ~~LOC~~ SOLN
0.0000 [IU] | SUBCUTANEOUS | Status: DC
  Administered 2018-09-12 (×2): 1 [IU] via SUBCUTANEOUS
  Administered 2018-09-13: 2 [IU] via SUBCUTANEOUS
  Administered 2018-09-14: 3 [IU] via SUBCUTANEOUS
  Administered 2018-09-14: 1 [IU] via SUBCUTANEOUS
  Administered 2018-09-14: 3 [IU] via SUBCUTANEOUS

## 2018-09-11 MED ORDER — DEXTROSE 50 % IV SOLN
INTRAVENOUS | Status: AC
  Filled 2018-09-11: qty 50

## 2018-09-11 NOTE — Progress Notes (Signed)
[facility] Progress Note   Subjective:  Sig abd pain, doesn't want to do HD today   Objective Vitals:   09/11/18 1138 09/11/18 1151 09/11/18 1200 09/11/18 1230  BP: (!) (P) 154/70 (!) (P) 146/64 (!) (P) 141/65 (P) 135/70  Pulse: (P) 62 (!) (P) 59 (!) (P) 59   Resp: (P) 18     Temp: (!) (P) 97.1 F (36.2 C)     TempSrc: (P) Oral     SpO2:       Physical Exam General: Elderly female uncomfortable NAD  Heart: RRR Lungs: CTAB  Abdomen: soft diffusely tender to palpation  Extremities: no sig edema LE's Dialysis Access: LUE AVF +bruit   [facility] 4h 400/800 53kg  2/2 bath L AVF Heparin 2200  Mircera 60mcg IV 2 weeks (last 12/23) No VDRA   Assessment/Plan: 1. Recurrent pancreatitis: Lipase 2164, per primary/ GI.  2. ESRD - Recent HD start. HD TTS. Missed HD Saturday. Doesn't want HD today due to pain, will postpone and resume regular HD tomorrow. K+ low. Vol up if wts are right.  3. HTN/volume - BP up.Continue home meds. Some+ volume on exam  4. Anemia - Hgb 10.9. No ESA needs currently. Follow trends.  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    Kelly Splinter MD [employer] pgr [phone]   09/11/2018, 1:46 PM      Additional Objective Labs: Basic Metabolic Panel: Recent Labs  Lab 09/09/18 1424 09/10/18 0118 09/11/18 0233  NA 137 138 136  K 3.1* 3.2* 3.3*  CL 96* 100 102  CO2 27 21* 17*  GLUCOSE 164* 104* 76  BUN 41* 49* 62*  CREATININE 3.49* 3.75* 3.94*  CALCIUM 9.4 8.6* 8.0*   CBC: Recent Labs  Lab 09/09/18 1424 09/10/18 0118 09/10/18 1026 09/10/18 1804 09/11/18 0233  WBC 20.5* 24.4*  --   --   --   HGB 11.7* 10.9* 10.7* 9.7* 9.4*  HCT 38.4 34.6*  --   --   --   MCV 94.1 94.5  --   --   --   PLT 351 274  --   --   --    Blood Culture    Component Value Date/Time   SDES BLOOD RIGHT ANTECUBITAL 09/09/2018 1918   SPECREQUEST  09/09/2018 1918    BOTTLES DRAWN AEROBIC ONLY Blood Culture adequate volume   CULT   09/09/2018 1918    NO GROWTH < 24 HOURS Performed at [facility], [place] [address] [place], [place] [address]    REPTSTATUS PENDING 09/09/2018 1918    Cardiac Enzymes: Recent Labs  Lab 09/09/18 1918 09/10/18 0118 09/10/18 0713  TROPONINI 0.09* 0.11* 0.10*   CBG: Recent Labs  Lab 09/10/18 1644 09/10/18 2118 09/10/18 2200 09/11/18 0810 09/11/18 1134  GLUCAP 72 58* 105* 64* 72   Iron Studies: No results for input(s): IRON, TIBC, TRANSFERRIN, FERRITIN in the last 72 hours. Lab Results  Component Value Date   INR 1.28 06/15/2018   INR 1.13 05/28/2018   INR 1.24 04/09/2018   Medications: . sodium chloride    . sodium chloride     . amLODipine  5 mg Oral Daily  . atorvastatin  40 mg Oral q1800  . carvedilol  12.5 mg Oral BID WC  . Chlorhexidine Gluconate Cloth  6 each Topical Q0600  . clopidogrel  75 mg Oral Q breakfast  . dextrose      . heparin  5,000 Units Subcutaneous Q8H  .  hydrALAZINE  25 mg Oral Q8H  . insulin aspart  0-9 Units Subcutaneous Q4H  . lipase/protease/amylase  24,000 Units Oral TID WC  . mometasone-formoterol  2 puff Inhalation BID  . pantoprazole  40 mg Oral Daily

## 2018-09-11 NOTE — Progress Notes (Addendum)
[facility] Attending   I have taken an interval history, reviewed the chart and examined the patient. I agree with the Advanced Practitioner's note, impression and recommendations.    She is improved since admit but continues to struggle with symptoms from pancreatitis.  Cause of pancreatitis not clear - malignancy as a cause is possible.  Inability to use IV contrast and the inflammation make cross sectional imaging and EUS difficult now.  I will check a CA 19-9 - it may help guide [place].  Gatha Mayer, MD, [facility] 09/11/2018 4:56 PM Pager [phone]        Daily Rounding Note  09/11/2018, 11:30 AM  LOS: 2 days   SUBJECTIVE:   Chief complaint: recurrent pancreatitis.   Abdominal pain improved.  Requiring periodic Dilaudid and oxycodone.  Hungry and wants to eat.   No BM'S for ~ 2 days.    OBJECTIVE:         Vital signs in last 24 hours:    Temp:  [97.9 F (36.6 C)-98.9 F (37.2 C)] 97.9 F (36.6 C) (01/06 1056) Pulse Rate:  [63-76] 63 (01/06 1056) Resp:  [14-18] 16 (01/06 1056) BP: (139-163)/(45-56) 139/50 (01/06 1056) SpO2:  [90 %-94 %] 93 % (01/06 1056) Last BM Date: 09/09/18 There were no vitals filed for this visit. General: looks chronically ill.  Comfortable on HD machine.     Heart: RRR Chest: clear bil.  No SOB or cough Abdomen: soft, minor tenderness upper abdomen.    Extremities: no CCE Neuro/Psych:  Oriented x 3.  Alert.    Intake/Output from previous day: No intake/output data recorded.  Intake/Output this shift: No intake/output data recorded.  Lab Results: Recent Labs    09/09/18 1424 09/10/18 0118 09/10/18 1026 09/10/18 1804 09/11/18 0233  WBC 20.5* 24.4*  --   --   --   HGB 11.7* 10.9* 10.7* 9.7* 9.4*  HCT 38.4 34.6*  --   --   --   PLT 351 274  --   --   --    BMET Recent Labs    09/09/18 1424 09/10/18 0118 09/11/18 0233  NA 137 138 136  K 3.1* 3.2*  3.3*  CL 96* 100 102  CO2 27 21* 17*  GLUCOSE 164* 104* 76  BUN 41* 49* 62*  CREATININE 3.49* 3.75* 3.94*  CALCIUM 9.4 8.6* 8.0*   LFT Recent Labs    09/09/18 1424  PROT 6.5  ALBUMIN 3.3*  AST 22  ALT 11  ALKPHOS 46  BILITOT 1.1   Studies/Results: Dg Acute Abd 2+v Abd (supine,erect,decub) + 1v Chest  Result Date: 09/09/2018 CLINICAL DATA:  Abdominal pain and vomiting.  Missed dialysis today. EXAM: DG ABDOMEN ACUTE W/ 1V CHEST COMPARISON:  09/08/2018 FINDINGS: Mild enlargement of the cardiopericardial silhouette with interstitial accentuation and Kerley B lines favoring acute interstitial pulmonary edema. Prior CABG. Prosthetic mitral valve. Atrial clip. Atherosclerotic calcification of the aortic arch. Aorta bi-iliac stent grafts traversing a rim calcified abdominal aortic aneurysm. Bilateral renal stents. Staple line along the rectum. Metal structure probably outside of the patient projects over the left lower pelvis. There is gas in the colon and in the stomach, small bowel is relatively gasless. No dilated bowel. IMPRESSION: 1. Interstitial pulmonary edema.  Mild cardiomegaly 2. No abnormality of the bowel gas pattern is identified although the small bowel is relatively gasless which can reduce negative predictive value. 3. Atherosclerosis. Aorta bi-iliac stent graft traverses an abdominal aortic aneurysm. Electronically Signed  By: Van Clines M.D.   On: 09/09/2018 17:05    ASSESMENT:   *  Recurrent acute pancreatitis. Lipase 2164.  ? Groove pancreatitis.    Initially had biliary pancreatitis in 04/2018.  Chronic cholecystitis on 04/19/18 lap chole.   Recurrent pancreatitis in 05/2018.  Non con CTs showing peripancreatic fluid collection.  Cystic duct stone on 07/10/18 MRI.  [facility] MD conferred that this not likeley causing sxs   09/08/18 CT scan without contrast: (probable groove) pancreatitis improved.  No change in 8 cm complex process at pancreatic head, duodenal sweep.   *    Malnutrition, required feeding tube that caused bleeding esophageal ulcers, 04/2018 EGD. [facility] reported PTA: carafate added to high dose PPI.  Pt does not want to undergo EGD.    *   Chronic Plavix for CAD.    *   ESRD.  HD today.    *   Afib.    *   Hypokalemia.     PLAN   *   Check Lipase in AM  *     Soft, renal diet.      Azucena Freed  09/11/2018, 11:30 AM Phone [phone]

## 2018-09-11 NOTE — Progress Notes (Signed)
Hypoglycemic Event  CBG:58  Treatment: D50 25 mL (12.5 gm)  Symptoms: None  Follow-up CBG 10pmCBG Result:105  Possible Reasons for Event: Inadequate meal intake  Comments/MD notified no    [name], [name]

## 2018-09-11 NOTE — Progress Notes (Signed)
PROGRESS NOTE    [name]  BZJ:[id number] DOB: March 16, 1951 DOA: 09/09/2018 PCP: Bernerd Limbo, MD     Brief Narrative:  [name] is a 68 y.o. female with medical history significant of ESRD on dialysis, abdominal aortic aneurysm status post repair, CAD who was recently admitted for pancreatitis returns again for abdominal pain.  Briefly, she was diagnosed with gallstone pancreatitis in August 2019, underwent cholecystectomy, has been repeatedly admitted for recurrent pancreatitis, pseudocyst.  She had a follow-up CT abdomen pelvis yesterday afternoon as an outpatient which revealed interval improvement of pancreatitis and inflammation and fluid surrounding the descending duodenum.  She states that after her CT scan, she started having worsening epigastric abdominal pain, nausea, vomiting. She denies any fevers, chills or shortness of breath.  She does admit to some central chest pain that is sharp in nature, radiating from her abdomen.  No worsening peripheral edema.  She was admitted for recurrent pancreatitis.  New events last 24 hours / Subjective: Continues to have epigastric abdominal pain.  Refused dialysis this morning due to worry about adequate pain control on dialysis unit  Assessment & Plan:   Principal Problem:   Acute recurrent pancreatitis Active Problems:   Essential hypertension   Coronary artery disease involving native heart with angina pectoris (HCC)   Chronic diastolic heart failure (HCC)   Diabetes mellitus (HCC)   Chronic diastolic congestive heart failure (HCC)   ESRD (end stage renal disease) ([place])   Acute recurrent pancreatitis, groove pancreatitis  -Complicated history of initially presumed gallstone pancreatitis status post cholecystectomy in August 2019 with subsequent recurrent pancreatitis requiring hospital admissions.  She has been followed by Dr. Rush Landmark, [facility], and underwent CT abdomen pelvis without contrast as an outpatient on 09/08/18  which revealed some interval improvement of pancreatitis and inflammation of fluid surrounding the descending duodenum.  No discrete pancreaticoduodenal mass was found. -NPO  -IVF stopped per Nephro rec -GI following   Coffee-ground emesis -Trend H&H, PPI -GI following  -Hold Plavix if recurs -No further emesis episodes reported  ESRD on dialysis TTS -Nephrology following  Demand ischemia -Troponin with flat trend 0.09, 0.11, 0.10  Coronary artery disease -Continue Plavix, statin  Type 2 diabetes -SSI  HTN -Continue norvasc, hydralazine   Chronic hypoxemic respiratory failure with COPD -At baseline uses 2 L nasal cannula O2 at nighttime  Chronic diastolic dysfunction -Echocardiogram 05/30/2018 shows an EF of 60 to 01%, grade 2 diastolic dysfunction -Continue coreg    DVT prophylaxis: Subq hep Code Status: Full Family Communication: No family at bedside Disposition Plan: Pending improvement in epigastric pain   Consultants:   GI  Nephrology  Procedures:   None   Antimicrobials:  Anti-infectives (From admission, onward)   None       Objective: Vitals:   09/10/18 2026 09/10/18 2121 09/11/18 0437 09/11/18 1056  BP:  (!) 163/53 (!) 158/56 (!) 139/50  Pulse: 72 76 70 63  Resp: 18 16 14 16   Temp:  98.5 F (36.9 C) 98.9 F (37.2 C) 97.9 F (36.6 C)  TempSrc:  Oral Oral Oral  SpO2:  92% 94% 93%    Intake/Output Summary (Last 24 hours) at 09/11/2018 1234 Last data filed at 09/11/2018 0912 Gross per 24 hour  Intake 0 ml  Output -  Net 0 ml   There were no vitals filed for this visit.  Examination: General exam: Appears calm and comfortable  Respiratory system: Clear to auscultation. Respiratory effort normal. Cardiovascular system: S1 & S2  heard, RRR. No JVD, murmurs, rubs, gallops or clicks. No pedal edema. Gastrointestinal system: Abdomen is nondistended, soft. Diffusely TTP, worst in epigastric region  Central nervous system: Alert and  oriented. No focal neurological deficits. Extremities: Symmetric 5 x 5 power. Skin: No rashes, lesions or ulcers Psychiatry: Judgement and insight appear normal. Mood & affect appropriate.    Data Reviewed: I have personally reviewed following labs and imaging studies  CBC: Recent Labs  Lab 09/09/18 1424 09/10/18 0118 09/10/18 1026 09/10/18 1804 09/11/18 0233  WBC 20.5* 24.4*  --   --   --   HGB 11.7* 10.9* 10.7* 9.7* 9.4*  HCT 38.4 34.6*  --   --   --   MCV 94.1 94.5  --   --   --   PLT 351 274  --   --   --    Basic Metabolic Panel: Recent Labs  Lab 09/09/18 1424 09/10/18 0118 09/11/18 0233  NA 137 138 136  K 3.1* 3.2* 3.3*  CL 96* 100 102  CO2 27 21* 17*  GLUCOSE 164* 104* 76  BUN 41* 49* 62*  CREATININE 3.49* 3.75* 3.94*  CALCIUM 9.4 8.6* 8.0*   GFR: CrCl cannot be calculated (Unknown ideal weight.). Liver Function Tests: Recent Labs  Lab 09/09/18 1424  AST 22  ALT 11  ALKPHOS 46  BILITOT 1.1  PROT 6.5  ALBUMIN 3.3*   Recent Labs  Lab 09/09/18 1424  LIPASE 2,164*   No results for input(s): AMMONIA in the last 168 hours. Coagulation Profile: No results for input(s): INR, PROTIME in the last 168 hours. Cardiac Enzymes: Recent Labs  Lab 09/09/18 1918 09/10/18 0118 09/10/18 0713  TROPONINI 0.09* 0.11* 0.10*   BNP (last 3 results) No results for input(s): PROBNP in the last 8760 hours. HbA1C: No results for input(s): HGBA1C in the last 72 hours. CBG: Recent Labs  Lab 09/10/18 1644 09/10/18 2118 09/10/18 2200 09/11/18 0810 09/11/18 1134  GLUCAP 72 58* 105* 64* 72   Lipid Profile: No results for input(s): CHOL, HDL, LDLCALC, TRIG, CHOLHDL, LDLDIRECT in the last 72 hours. Thyroid Function Tests: No results for input(s): TSH, T4TOTAL, FREET4, T3FREE, THYROIDAB in the last 72 hours. Anemia Panel: No results for input(s): VITAMINB12, FOLATE, FERRITIN, TIBC, IRON, RETICCTPCT in the last 72 hours. Sepsis Labs: Recent Labs  Lab  09/09/18 1634  LATICACIDVEN 1.65    Recent Results (from the past 240 hour(s))  Blood culture (routine x 2)     Status: None (Preliminary result)   Collection Time: 09/09/18  4:02 PM  Result Value Ref Range Status   Specimen Description BLOOD RIGHT ANTECUBITAL  Final   Special Requests   Final    BOTTLES DRAWN AEROBIC AND ANAEROBIC Blood Culture results may not be optimal due to an excessive volume of blood received in culture bottles   Culture NO GROWTH < 24 HOURS  Final   Report Status PENDING  Incomplete  Blood culture (routine x 2)     Status: None (Preliminary result)   Collection Time: 09/09/18  7:18 PM  Result Value Ref Range Status   Specimen Description BLOOD RIGHT ANTECUBITAL  Final   Special Requests   Final    BOTTLES DRAWN AEROBIC ONLY Blood Culture adequate volume   Culture   Final    NO GROWTH < 24 HOURS Performed at [facility], [address], [place],  [address]    Report Status PENDING  Incomplete       Radiology  Studies: Dg Acute Abd 2+v Abd (supine,erect,decub) + 1v Chest  Result Date: 09/09/2018 CLINICAL DATA:  Abdominal pain and vomiting.  Missed dialysis today. EXAM: DG ABDOMEN ACUTE W/ 1V CHEST COMPARISON:  09/08/2018 FINDINGS: Mild enlargement of the cardiopericardial silhouette with interstitial accentuation and Kerley B lines favoring acute interstitial pulmonary edema. Prior CABG. Prosthetic mitral valve. Atrial clip. Atherosclerotic calcification of the aortic arch. Aorta bi-iliac stent grafts traversing a rim calcified abdominal aortic aneurysm. Bilateral renal stents. Staple line along the rectum. Metal structure probably outside of the patient projects over the left lower pelvis. There is gas in the colon and in the stomach, small bowel is relatively gasless. No dilated bowel. IMPRESSION: 1. Interstitial pulmonary edema.  Mild cardiomegaly 2. No abnormality of the bowel gas pattern is identified although the small bowel is relatively  gasless which can reduce negative predictive value. 3. Atherosclerosis. Aorta bi-iliac stent graft traverses an abdominal aortic aneurysm. Electronically Signed   By: Van Clines M.D.   On: 09/09/2018 17:05      Scheduled Meds: . amLODipine  5 mg Oral Daily  . atorvastatin  40 mg Oral q1800  . carvedilol  12.5 mg Oral BID WC  . Chlorhexidine Gluconate Cloth  6 each Topical Q0600  . clopidogrel  75 mg Oral Q breakfast  . dextrose      . heparin  5,000 Units Subcutaneous Q8H  . hydrALAZINE  25 mg Oral Q8H  . insulin aspart  0-9 Units Subcutaneous Q4H  . lipase/protease/amylase  24,000 Units Oral TID WC  . mometasone-formoterol  2 puff Inhalation BID  . pantoprazole  40 mg Oral Daily   Continuous Infusions: . sodium chloride    . sodium chloride       LOS: 2 days    Time spent: 25 minutes   Dessa Phi, DO [facility] [email or web address] Password [facility] 09/11/2018, 12:34 PM

## 2018-09-12 DIAGNOSIS — K8689 Other specified diseases of pancreas: Secondary | ICD-10-CM

## 2018-09-12 LAB — GLUCOSE, CAPILLARY
Glucose-Capillary: 104 mg/dL — ABNORMAL HIGH (ref 70–99)
Glucose-Capillary: 124 mg/dL — ABNORMAL HIGH (ref 70–99)
Glucose-Capillary: 127 mg/dL — ABNORMAL HIGH (ref 70–99)
Glucose-Capillary: 145 mg/dL — ABNORMAL HIGH (ref 70–99)
Glucose-Capillary: 68 mg/dL — ABNORMAL LOW (ref 70–99)
Glucose-Capillary: 83 mg/dL (ref 70–99)
Glucose-Capillary: 96 mg/dL (ref 70–99)

## 2018-09-12 LAB — LIPASE, BLOOD: Lipase: 96 U/L — ABNORMAL HIGH (ref 11–51)

## 2018-09-12 LAB — BASIC METABOLIC PANEL
ANION GAP: 11 (ref 5–15)
BUN: 19 mg/dL (ref 8–23)
CO2: 27 mmol/L (ref 22–32)
Calcium: 8.1 mg/dL — ABNORMAL LOW (ref 8.9–10.3)
Chloride: 98 mmol/L (ref 98–111)
Creatinine, Ser: 2.29 mg/dL — ABNORMAL HIGH (ref 0.44–1.00)
GFR calc Af Amer: 25 mL/min — ABNORMAL LOW (ref >60)
GFR calc non Af Amer: 22 mL/min — ABNORMAL LOW (ref >60)
Glucose, Bld: 127 mg/dL — ABNORMAL HIGH (ref 70–99)
Potassium: 3.3 mmol/L — ABNORMAL LOW (ref 3.5–5.1)
Sodium: 136 mmol/L (ref 135–145)

## 2018-09-12 LAB — HEMOGLOBIN: Hemoglobin: 8.7 g/dL — ABNORMAL LOW (ref 12.0–15.0)

## 2018-09-12 MED ORDER — HYDROMORPHONE HCL 1 MG/ML IJ SOLN
INTRAMUSCULAR | Status: AC
  Administered 2018-09-12: 1 mg via INTRAVENOUS
  Filled 2018-09-12: qty 1

## 2018-09-12 MED ORDER — HYDROMORPHONE HCL 1 MG/ML IJ SOLN
INTRAMUSCULAR | Status: AC
  Filled 2018-09-12: qty 1

## 2018-09-12 MED ORDER — HEPARIN SODIUM (PORCINE) 1000 UNIT/ML DIALYSIS: 2000.0000 [IU] | Freq: Once | INTRAMUSCULAR | Status: DC

## 2018-09-12 NOTE — Procedures (Signed)
   I was present at this dialysis session, have reviewed the session itself and made  appropriate changes Kelly Splinter MD [place] pager [phone]   09/12/2018, 3:02 PM

## 2018-09-12 NOTE — Progress Notes (Signed)
PROGRESS NOTE    [name]  YTK:[id number] DOB: 1951/03/05 DOA: 09/09/2018 PCP: Bernerd Limbo, MD     Brief Narrative:  [name] is a 68 y.o. female with medical history significant of ESRD on dialysis, abdominal aortic aneurysm status post repair, CAD who was recently admitted for pancreatitis returns again for abdominal pain.  Briefly, she was diagnosed with gallstone pancreatitis in August 2019, underwent cholecystectomy, has been repeatedly admitted for recurrent pancreatitis, pseudocyst.  She had a follow-up CT abdomen pelvis yesterday afternoon as an outpatient which revealed interval improvement of pancreatitis and inflammation and fluid surrounding the descending duodenum.  She states that after her CT scan, she started having worsening epigastric abdominal pain, nausea, vomiting. She denies any fevers, chills or shortness of breath.  She does admit to some central chest pain that is sharp in nature, radiating from her abdomen.  No worsening peripheral edema.  She was admitted for recurrent pancreatitis.  New events last 24 hours / Subjective: Pain has improved, but continues to require IV pain medications.  Worried about tolerating dialysis due to pain.  Assessment & Plan:   Principal Problem:   Acute recurrent pancreatitis Active Problems:   Essential hypertension   Coronary artery disease involving native heart with angina pectoris (HCC)   Chronic diastolic heart failure (HCC)   Diabetes mellitus (HCC)   Chronic diastolic congestive heart failure (HCC)   ESRD (end stage renal disease) ([place])   Acute recurrent pancreatitis, groove pancreatitis  -Complicated history of initially presumed gallstone pancreatitis status post cholecystectomy in August 2019 with subsequent recurrent pancreatitis requiring hospital admissions.  She has been followed by Dr. Rush Landmark, [facility], and underwent CT abdomen pelvis without contrast as an outpatient on 09/08/18 which revealed some  interval improvement of pancreatitis and inflammation of fluid surrounding the descending duodenum.  No discrete pancreaticoduodenal mass was found. -GI following  -Clear liquid diet started today  Coffee-ground emesis -Trend H&H, PPI -GI following  -Hold Plavix if recurs -No further emesis episodes reported  ESRD on dialysis TTS -Nephrology following  Demand ischemia -Troponin with flat trend 0.09, 0.11, 0.10  Coronary artery disease -Continue Plavix, statin  Type 2 diabetes -SSI  HTN -Continue norvasc, hydralazine   Chronic hypoxemic respiratory failure with COPD -At baseline uses 2 L nasal cannula O2 at nighttime  Chronic diastolic dysfunction -Echocardiogram 05/30/2018 shows an EF of 60 to 75%, grade 2 diastolic dysfunction -Continue coreg    DVT prophylaxis: Subq hep Code Status: Full Family Communication: No family at bedside Disposition Plan: Pending improvement in epigastric pain   Consultants:   GI  Nephrology  Procedures:   None   Antimicrobials:  Anti-infectives (From admission, onward)   None       Objective: Vitals:   09/11/18 2020 09/11/18 2034 09/12/18 0503 09/12/18 0742  BP: (!) 166/53  (!) 170/73   Pulse: 65  66   Resp: 16  14   Temp: 98.6 F (37 C)  98.8 F (37.1 C)   TempSrc: Oral  Oral   SpO2: 95% 94% 93% 93%  Weight:        Intake/Output Summary (Last 24 hours) at 09/12/2018 1425 Last data filed at 09/12/2018 0507 Gross per 24 hour  Intake 480 ml  Output 2368 ml  Net -1888 ml   Filed Weights   09/11/18 1138  Weight: 57.6 kg    Examination: General exam: Appears calm and comfortable  Respiratory system: Clear to auscultation. Respiratory effort normal. Cardiovascular system: S1 &  S2 heard, RRR. No JVD, murmurs, rubs, gallops or clicks. No pedal edema. Gastrointestinal system: Abdomen is nondistended, soft and TTP epigastric. No organomegaly or masses felt. Normal bowel sounds heard. Central nervous system:  Alert and oriented. No focal neurological deficits. Extremities: Symmetric 5 x 5 power. Skin: No rashes, lesions or ulcers Psychiatry: Judgement and insight appear normal. Mood & affect appropriate.    Data Reviewed: I have personally reviewed following labs and imaging studies  CBC: Recent Labs  Lab 09/09/18 1424 09/10/18 0118 09/10/18 1026 09/10/18 1804 09/11/18 0233 09/12/18 0332  WBC 20.5* 24.4*  --   --   --   --   HGB 11.7* 10.9* 10.7* 9.7* 9.4* 8.7*  HCT 38.4 34.6*  --   --   --   --   MCV 94.1 94.5  --   --   --   --   PLT 351 274  --   --   --   --    Basic Metabolic Panel: Recent Labs  Lab 09/09/18 1424 09/10/18 0118 09/11/18 0233 09/12/18 0332  NA 137 138 136 136  K 3.1* 3.2* 3.3* 3.3*  CL 96* 100 102 98  CO2 27 21* 17* 27  GLUCOSE 164* 104* 76 127*  BUN 41* 49* 62* 19  CREATININE 3.49* 3.75* 3.94* 2.29*  CALCIUM 9.4 8.6* 8.0* 8.1*   GFR: Estimated Creatinine Clearance: 22.3 mL/min (A) (by C-G formula based on SCr of 2.29 mg/dL (H)). Liver Function Tests: Recent Labs  Lab 09/09/18 1424  AST 22  ALT 11  ALKPHOS 46  BILITOT 1.1  PROT 6.5  ALBUMIN 3.3*   Recent Labs  Lab 09/09/18 1424 09/12/18 0332  LIPASE 2,164* 96*   No results for input(s): AMMONIA in the last 168 hours. Coagulation Profile: No results for input(s): INR, PROTIME in the last 168 hours. Cardiac Enzymes: Recent Labs  Lab 09/09/18 1918 09/10/18 0118 09/10/18 0713  TROPONINI 0.09* 0.11* 0.10*   BNP (last 3 results) No results for input(s): PROBNP in the last 8760 hours. HbA1C: No results for input(s): HGBA1C in the last 72 hours. CBG: Recent Labs  Lab 09/11/18 2025 09/12/18 0122 09/12/18 0500 09/12/18 0759 09/12/18 1157  GLUCAP 107* 104* 124* 145* 96   Lipid Profile: No results for input(s): CHOL, HDL, LDLCALC, TRIG, CHOLHDL, LDLDIRECT in the last 72 hours. Thyroid Function Tests: No results for input(s): TSH, T4TOTAL, FREET4, T3FREE, THYROIDAB in the last 72  hours. Anemia Panel: No results for input(s): VITAMINB12, FOLATE, FERRITIN, TIBC, IRON, RETICCTPCT in the last 72 hours. Sepsis Labs: Recent Labs  Lab 09/09/18 1634  LATICACIDVEN 1.65    Recent Results (from the past 240 hour(s))  Blood culture (routine x 2)     Status: None (Preliminary result)   Collection Time: 09/09/18  4:02 PM  Result Value Ref Range Status   Specimen Description BLOOD RIGHT ANTECUBITAL  Final   Special Requests   Final    BOTTLES DRAWN AEROBIC AND ANAEROBIC Blood Culture results may not be optimal due to an excessive volume of blood received in culture bottles   Culture   Final    NO GROWTH 3 DAYS Performed at [facility], [place] [address], [place], [place] [address]    Report Status PENDING  Incomplete  Blood culture (routine x 2)     Status: None (Preliminary result)   Collection Time: 09/09/18  7:18 PM  Result Value Ref Range Status   Specimen Description BLOOD RIGHT ANTECUBITAL  Final  Special Requests   Final    BOTTLES DRAWN AEROBIC ONLY Blood Culture adequate volume   Culture   Final    NO GROWTH 3 DAYS Performed at [facility], [place] [address] [place], [place] [address]    Report Status PENDING  Incomplete       Radiology Studies: No results found.    Scheduled Meds: . HYDROmorphone      . amLODipine  5 mg Oral Daily  . atorvastatin  40 mg Oral q1800  . carvedilol  12.5 mg Oral BID WC  . Chlorhexidine Gluconate Cloth  6 each Topical Q0600  . clopidogrel  75 mg Oral Q breakfast  . heparin  5,000 Units Subcutaneous Q8H  . hydrALAZINE  25 mg Oral Q8H  . insulin aspart  0-9 Units Subcutaneous Q4H  . lipase/protease/amylase  24,000 Units Oral TID WC  . mometasone-formoterol  2 puff Inhalation BID  . pantoprazole  40 mg Oral Daily   Continuous Infusions:    LOS: 3 days    Time spent: 25 minutes   Dessa Phi, DO [facility] [email or web address] Password [email or web address] 09/12/2018, 2:25 PM

## 2018-09-12 NOTE — Care Management Important Message (Signed)
Important Message  Patient Details  Name: [name] MRN: [id number] Date of Birth: 17-Jan-1951   Medicare Important Message Given:  Yes    [name] 09/12/2018, 3:46 PM

## 2018-09-12 NOTE — Progress Notes (Addendum)
[facility] Attending   I have taken an interval history, reviewed the chart and examined the patient. I agree with the Advanced Practitioner's note, impression and recommendations.   This lady is In a tough place.  Suspect most likely benign cause of pancreatitis but a tumor (cancer) could be source).  Await CA 19-9 - if really high will be a sig clue, otherwise will not.  She blanched at the idea of nasoenteric feeding.  We are unable to image using IV contrast based upon protocols re: no IV contrast in first 6 mos of hemodialysis. We may need to violate that protocol depending upon clinical course.  Gatha Mayer, MD, [facility] 09/12/2018 3:31 PM Pager [phone]           Daily Rounding Note  09/12/2018, 8:31 AM  LOS: 3 days   SUBJECTIVE:   Chief complaint: pancreatitis    Feels miserable.  Nauseated and bilious emesis, has not eaten any of the solids she negotiated and was hungry for yesterday.  No BM's.   Her whole body aches.   Diffuse abdominal pain persists but not as bad as before.    OBJECTIVE:         Vital signs in last 24 hours:    Temp:  [97.1 F (36.2 C)-98.8 F (37.1 C)] 98.8 F (37.1 C) (01/07 0503) Pulse Rate:  [59-73] 66 (01/07 0503) Resp:  [14-18] 14 (01/07 0503) BP: (114-170)/(50-73) 170/73 (01/07 0503) SpO2:  [93 %-98 %] 93 % (01/07 0742) Weight:  [57.6 kg] 57.6 kg (01/06 1138) Last BM Date: 09/09/18 Filed Weights   09/11/18 1138  Weight: 57.6 kg   General: looks ill, depressed, grimacing facial expression  Heart: RRR Chest: clear bil.   Abdomen: active BS.  Tender diffusely without guard/rebound.    Extremities: no CCE.  Bruising on arm.   Neuro/Psych:  Depressed, flat affect.   Oriented x 3.  Moves al 4 limbs.    Intake/Output from previous day: 01/06 0701 - 01/07 0700 In: 480 [P.O.:480] Out: 2368   Lab Results: Recent Labs    09/09/18 1424 09/10/18 0118   09/10/18 1804 09/11/18 0233 09/12/18 0332  WBC 20.5* 24.4*  --   --   --   --   HGB 11.7* 10.9*   < > 9.7* 9.4* 8.7*  HCT 38.4 34.6*  --   --   --   --   PLT 351 274  --   --   --   --    < > = values in this interval not displayed.   BMET Recent Labs    09/10/18 0118 09/11/18 0233 09/12/18 0332  NA 138 136 136  K 3.2* 3.3* 3.3*  CL 100 102 98  CO2 21* 17* 27  GLUCOSE 104* 76 127*  BUN 49* 62* 19  CREATININE 3.75* 3.94* 2.29*  CALCIUM 8.6* 8.0* 8.1*   LFT Recent Labs    09/09/18 1424  PROT 6.5  ALBUMIN 3.3*  AST 22  ALT 11  ALKPHOS 46  BILITOT 1.1    Scheduled Meds: . amLODipine  5 mg Oral Daily  . atorvastatin  40 mg Oral q1800  . carvedilol  12.5 mg Oral BID WC  . Chlorhexidine Gluconate Cloth  6 each Topical Q0600  . clopidogrel  75 mg Oral Q breakfast  . heparin  5,000 Units Subcutaneous Q8H  . hydrALAZINE  25 mg Oral Q8H  . insulin aspart  0-9 Units Subcutaneous Q4H  .  lipase/protease/amylase  24,000 Units Oral TID WC  . mometasone-formoterol  2 puff Inhalation BID  . pantoprazole  40 mg Oral Daily   Continuous Infusions: PRN Meds:.heparin, HYDROmorphone (DILAUDID) injection, ondansetron **OR** ondansetron (ZOFRAN) IV, oxyCODONE-acetaminophen   ASSESMENT:   *  Recurrent acute pancreatitis. Lipase 2164 >> 96.  ?Groove pancreatitis. ?Malignancy.   Initially had biliary pancreatitis in 04/2018.  Chronic cholecystitis on 04/19/18 lap chole.   Recurrent pancreatitis in 05/2018.  Non con CTs showing peripancreatic fluid collection.  Cystic duct stone on 07/10/18 MRI.  [facility] MD conferred that this not likeley causing sxs. 09/08/18 CT scan without contrast: (probable groove) pancreatitis improved.  No change in 8 cm complex process at pancreatic head, duodenal sweep.  Imaging options limited by inability to employ IV contrast.  However she is now HD pt so could we now make use of IV contrast?   CA 19-9 pending.    *   Malnutrition, required feeding tube that  caused bleeding esophageal ulcers, 04/2018 EGD. [facility] reported PTA: carafate added to high dose PPI.  Pt does not want to undergo EGD.  Currently bilious emesis.     *   Chronic Plavix for CAD.    *   Chronic anemia.  Renal following, do not feel ESA needed currently.    *   ESRD. HD yesterday, after initial pt refulsal.  Attempt to get her back on TTS schedule disrupted as pt refusing HD this AM     *   Afib.    *   Hypokalemia.      PLAN   *   Supportive care.  Revert to clears.       Azucena Freed  09/12/2018, 8:31 AM Phone [phone]

## 2018-09-13 ENCOUNTER — Ambulatory Visit: Payer: Self-pay | Admitting: Gastroenterology

## 2018-09-13 DIAGNOSIS — K8689 Other specified diseases of pancreas: Secondary | ICD-10-CM

## 2018-09-13 LAB — CBC
HEMATOCRIT: 29.1 % — AB (ref 36.0–46.0)
Hemoglobin: 9.2 g/dL — ABNORMAL LOW (ref 12.0–15.0)
MCH: 28.6 pg (ref 26.0–34.0)
MCHC: 31.6 g/dL (ref 30.0–36.0)
MCV: 90.4 fL (ref 80.0–100.0)
Platelets: 185 10*3/uL (ref 150–400)
RBC: 3.22 MIL/uL — ABNORMAL LOW (ref 3.87–5.11)
RDW: 17.7 % — ABNORMAL HIGH (ref 11.5–15.5)
WBC: 12.5 10*3/uL — ABNORMAL HIGH (ref 4.0–10.5)
nRBC: 0 % (ref 0.0–0.2)

## 2018-09-13 LAB — GLUCOSE, CAPILLARY
Glucose-Capillary: 108 mg/dL — ABNORMAL HIGH (ref 70–99)
Glucose-Capillary: 110 mg/dL — ABNORMAL HIGH (ref 70–99)
Glucose-Capillary: 117 mg/dL — ABNORMAL HIGH (ref 70–99)
Glucose-Capillary: 158 mg/dL — ABNORMAL HIGH (ref 70–99)
Glucose-Capillary: 68 mg/dL — ABNORMAL LOW (ref 70–99)
Glucose-Capillary: 88 mg/dL (ref 70–99)
Glucose-Capillary: 92 mg/dL (ref 70–99)

## 2018-09-13 LAB — BASIC METABOLIC PANEL
ANION GAP: 6 (ref 5–15)
BUN: 11 mg/dL (ref 8–23)
CO2: 30 mmol/L (ref 22–32)
Calcium: 8.3 mg/dL — ABNORMAL LOW (ref 8.9–10.3)
Chloride: 99 mmol/L (ref 98–111)
Creatinine, Ser: 1.86 mg/dL — ABNORMAL HIGH (ref 0.44–1.00)
GFR calc Af Amer: 32 mL/min — ABNORMAL LOW (ref >60)
GFR calc non Af Amer: 28 mL/min — ABNORMAL LOW (ref >60)
Glucose, Bld: 118 mg/dL — ABNORMAL HIGH (ref 70–99)
Potassium: 4.2 mmol/L (ref 3.5–5.1)
Sodium: 135 mmol/L (ref 135–145)

## 2018-09-13 LAB — CANCER ANTIGEN 19-9: CA 19-9: 51 U/mL — ABNORMAL HIGH (ref 0–35)

## 2018-09-13 MED ORDER — PREDNISONE 50 MG PO TABS: 50.0000 mg | ORAL_TABLET | Freq: Four times a day (QID) | ORAL | Status: DC

## 2018-09-13 MED ORDER — DARBEPOETIN ALFA 60 MCG/0.3ML IJ SOSY
60.0000 ug | PREFILLED_SYRINGE | INTRAMUSCULAR | Status: DC
  Administered 2018-09-15: 60 ug via INTRAVENOUS
  Filled 2018-09-13: qty 0.3

## 2018-09-13 MED ORDER — PREDNISONE 50 MG PO TABS
50.0000 mg | ORAL_TABLET | Freq: Four times a day (QID) | ORAL | Status: AC
  Administered 2018-09-13 – 2018-09-14 (×3): 50 mg via ORAL
  Filled 2018-09-13 (×3): qty 1

## 2018-09-13 MED ORDER — DIPHENHYDRAMINE HCL 25 MG PO CAPS
50.0000 mg | ORAL_CAPSULE | Freq: Once | ORAL | Status: AC
  Administered 2018-09-14: 50 mg via ORAL
  Filled 2018-09-13: qty 2

## 2018-09-13 MED ORDER — RENA-VITE PO TABS
1.0000 | ORAL_TABLET | Freq: Every day | ORAL | Status: DC
  Administered 2018-09-13 – 2018-09-14 (×2): 1 via ORAL
  Filled 2018-09-13 (×2): qty 1

## 2018-09-13 MED ORDER — DIPHENHYDRAMINE HCL 50 MG/ML IJ SOLN: 50.0000 mg | Freq: Once | INTRAMUSCULAR | Status: DC

## 2018-09-13 MED ORDER — DIPHENHYDRAMINE HCL 50 MG/ML IJ SOLN: 50.0000 mg | Freq: Once | INTRAMUSCULAR | Status: AC

## 2018-09-13 MED ORDER — HYDROMORPHONE HCL 2 MG PO TABS
2.0000 mg | ORAL_TABLET | ORAL | Status: DC
  Administered 2018-09-13 – 2018-09-15 (×11): 2 mg via ORAL
  Filled 2018-09-13 (×11): qty 1

## 2018-09-13 MED ORDER — PRO-STAT SUGAR FREE PO LIQD
30.0000 mL | Freq: Two times a day (BID) | ORAL | Status: DC
  Administered 2018-09-13 – 2018-09-14 (×4): 30 mL via ORAL
  Filled 2018-09-13 (×4): qty 30

## 2018-09-13 MED ORDER — CHLORHEXIDINE GLUCONATE CLOTH 2 % EX PADS
6.0000 | MEDICATED_PAD | Freq: Every day | CUTANEOUS | Status: DC
  Administered 2018-09-13 – 2018-09-14 (×2): 6 via TOPICAL

## 2018-09-13 MED ORDER — DIPHENHYDRAMINE HCL 25 MG PO CAPS: 50.0000 mg | ORAL_CAPSULE | Freq: Once | ORAL | Status: DC

## 2018-09-13 MED ORDER — BOOST / RESOURCE BREEZE PO LIQD CUSTOM
1.0000 | ORAL | Status: DC
  Administered 2018-09-13 – 2018-09-14 (×2): 1 via ORAL

## 2018-09-13 MED ORDER — DIPHENHYDRAMINE HCL 25 MG PO CAPS
25.0000 mg | ORAL_CAPSULE | Freq: Four times a day (QID) | ORAL | Status: DC | PRN
  Administered 2018-09-13: 25 mg via ORAL
  Filled 2018-09-13: qty 1

## 2018-09-13 MED ORDER — POLYETHYLENE GLYCOL 3350 17 G PO PACK
17.0000 g | PACK | Freq: Every day | ORAL | Status: DC
  Filled 2018-09-13: qty 1

## 2018-09-13 NOTE — Progress Notes (Addendum)
Pt does not want to do pre-meds for CT scan today as this would put her getting her scan at 2am. Pharmacy notified to retime pre-meds.   CT notified pt does not want to do CT today due to timing.  Confirmed with CT to scan tomorrow 1/9 at 9am. Start pre-meds at 2000- 09/13/18.

## 2018-09-13 NOTE — Progress Notes (Signed)
Hypoglycemic Event  CBG: 68  Treatment: 4 oz juice/soda  Symptoms: Hungry  Follow-up CBG: Time:0036 CBG Result:88  Possible Reasons for Event: Inadequate meal intake  Comments/MD notified:    Suzan Nailer

## 2018-09-13 NOTE — Progress Notes (Signed)
Initial Nutrition Assessment  DOCUMENTATION CODES:   Non-severe (moderate) malnutrition in context of chronic illness, Underweight  INTERVENTION:   -Continue Boost Breeze po TID, each supplement provides 250 kcal and 9 grams of protein -Continue 30 ml Prostat TID, each supplement provides 100 kcals and 15 grams protein -Magic cup TID with meals, each supplement provides 290 kcal and 9 grams of protein -Renal MVI daily  NUTRITION DIAGNOSIS:   Moderate Malnutrition related to chronic illness(pancreatitis, ESRD on HD) as evidenced by energy intake < or equal to 75% for > or equal to 1 month, mild fat depletion, moderate fat depletion, mild muscle depletion, moderate muscle depletion, edema.  GOAL:   Patient will meet greater than or equal to 90% of their needs  MONITOR:   PO intake, Supplement acceptance, Diet advancement, Labs, Weight trends, Skin, I & O's  REASON FOR ASSESSMENT:   Other (Comment)    ASSESSMENT:   [name] is a 68 y.o. female with medical history significant of ESRD on dialysis, abdominal aortic aneurysm status post repair, CAD who was recently admitted for pancreatitis returns again for abdominal pain.  Briefly, she was diagnosed with gallstone pancreatitis in August 2019, underwent cholecystectomy, has been repeatedly admitted for recurrent pancreatitis, pseudocyst.  She had a follow-up CT abdomen pelvis yesterday afternoon as an outpatient which revealed interval improvement of pancreatitis and inflammation and fluid surrounding the descending duodenum.  She states that after her CT scan, she started having worsening epigastric abdominal pain, nausea, vomiting. She denies any fevers, chills or shortness of breath.  She does admit to some central chest pain that is sharp in nature, radiating from her abdomen.  No worsening peripheral edema.  Pt admitted with acute recurrent pancreatitis.   RD pulled to chart due to low BMI. Pt also familiar to Clinical RD  team due to prior admissions.   Spoke with pt at bedside, who reports decreased appetite over the past several months. Most recently, she reports her largest barrier is related to nausea. During hospitalization, pt shares that she has mainly been on clear liquids and full liquids depending on her nausea, which is improved today. PTA, pt consumes 2 meals per day, which mainly consist of hamburger helper or mac and cheese with tomatoes. Pt shares she also tries to consume 3 Boost Breeze and 3 Prostat supplements daily. On HD days, pt shares that she will usually only consume one small snack prior to treatment. She will sometimes eat a small snack after HD, but often feel "too wiped out". She just started HD about 3 weeks ago and is still struggling with transition. She sometimes eats a snacks (such as a bag of chips) during HD treatment.   Pt endorses wt loss over the past 6 months. She reports her UBW is around 150#. Reviewed wt hx; noted pt has experienced a 27% wt loss over the past 3 months, which is significant for time frame, however, suspect fluid changes from HD may be contributing to these changes. Pt estimated HD dry wt of 54 kg. Pt also with moderate edema in lower legs and feet which is likely masking further weight loss and muscle depletions.   Per GI notes, EGS form last admission relvealed esophageal ulcer, which was likely related to NGT. Pt resistant to NGT placement at ths time. Pt verbalized to this RD during interview that she would never agree to an NGT again due to past experience. Discussed importance of good meal and supplement intake, especially since pt  is refusing NGT placement.   Medications reviewed and include Creon.  Last Hgb A1c: 5.3 (05/29/18). PTA DM medications are none.  Labs reviewed: CBGS: 68-110 (inpatient orders for glycemic control are 0-9 units insulin aspart every 4 hours).   NUTRITION - FOCUSED PHYSICAL EXAM:    Most Recent Value  Orbital Region  Mild  depletion  Upper Arm Region  Moderate depletion  Thoracic and Lumbar Region  Mild depletion  Buccal Region  Mild depletion  Temple Region  Mild depletion  Clavicle Bone Region  Moderate depletion  Clavicle and Acromion Bone Region  Moderate depletion  Scapular Bone Region  Moderate depletion  Dorsal Hand  Moderate depletion  Patellar Region  No depletion  Anterior Thigh Region  Mild depletion  Posterior Calf Region  No depletion  Edema (RD Assessment)  Moderate  Hair  Reviewed  Eyes  Reviewed  Mouth  Reviewed  Skin  Reviewed  Nails  Reviewed       Diet Order:   Diet Order            Diet full liquid Room service appropriate? Yes; Fluid consistency: Thin  Diet effective now              EDUCATION NEEDS:   Education needs have been addressed  Skin:  Skin Assessment: Skin Integrity Issues: Skin Integrity Issues:: Other (Comment) Other: MASD buttocks  Last BM:  09/09/18  Height:   Ht Readings from Last 1 Encounters:  08/17/18 5' 6"  (1.676 m)    Weight:   Wt Readings from Last 1 Encounters:  09/12/18 51.1 kg    Ideal Body Weight:  59.1 kg  BMI:  Body mass index is 18.18 kg/m.  Estimated Nutritional Needs:   Kcal:  1600-1800  Protein:  85-100 grams  Fluid:  1000 ml + UOP    Bishop Vanderwerf A. Jimmye Norman, RD, LDN, CDE Pager: [phone] After hours Pager: [phone]

## 2018-09-13 NOTE — Progress Notes (Addendum)
[facility] Attending   I have taken an interval history, reviewed the chart and examined the patient. I agree with the Advanced Practitioner's note, impression and recommendations.   CA 19-9 is only mildly elevated so I do not think cancer an issue. I have spoken to nephrology (Dr. Jonnie Finner) and she is committed to HD so IV contrast is ok She has had itching with contrast so will need premedication but will get CT w/ contrast  Reviewed with patient and daughter  Gatha Mayer, MD, [facility] 09/13/2018 11:23 AM Pager [phone]           Daily Rounding Note  09/13/2018, 10:31 AM  LOS: 4 days   SUBJECTIVE:   Chief complaint:  Acute recurrent pancreatitis    Pain better controlled with both oral and IV pain meds.  She has developed itching from the Percocet so this is being replaced with oral Dilaudid combined with IV Dilaudid.  No BMs.  Nausea resolved.  Diet advanced to full liquids starting at lunch today.  OBJECTIVE:         Vital signs in last 24 hours:    Temp:  [97.8 F (36.6 C)-98.5 F (36.9 C)] 98.5 F (36.9 C) (01/08 4742) Pulse Rate:  [60-69] 60 (01/08 0608) Resp:  [16-18] 17 (01/08 0608) BP: (103-148)/(46-74) 147/74 (01/08 0608) SpO2:  [93 %-98 %] 96 % (01/08 0815) Weight:  [51.1 kg-54.2 kg] 51.1 kg (01/07 1644) Last BM Date: 09/09/18 Filed Weights   09/11/18 1138 09/12/18 1335 09/12/18 1644  Weight: 57.6 kg 54.2 kg 51.1 kg   General: Looks more comfortable and alert but still significantly chronically ill. Heart: RRR. Chest: Clear bilaterally, overall diminished breath sounds.  No cough, no labored breathing. Abdomen: Soft.  Mild tenderness to moderate palpation.  Distended.  Active bowel sounds. Extremities: Lower extremity edema, 1 to 2+ Neuro/Psych: More alert and engaged today.  Calm, cooperative.  Gross deficits.  No tremors.  Intake/Output from previous day: 01/07 0701 -  01/08 0700 In: 480 [P.O.:480] Out: 2625   Intake/Output this shift: No intake/output data recorded.  Lab Results: Recent Labs    09/11/18 0233 09/12/18 0332 09/13/18 0218  WBC  --   --  12.5*  HGB 9.4* 8.7* 9.2*  HCT  --   --  29.1*  PLT  --   --  185   BMET Recent Labs    09/11/18 0233 09/12/18 0332 09/13/18 0218  NA 136 136 135  K 3.3* 3.3* 4.2  CL 102 98 99  CO2 17* 27 30  GLUCOSE 76 127* 118*  BUN 62* 19 11  CREATININE 3.94* 2.29* 1.86*  CALCIUM 8.0* 8.1* 8.3*   Scheduled Meds: . amLODipine  5 mg Oral Daily  . atorvastatin  40 mg Oral q1800  . carvedilol  12.5 mg Oral BID WC  . Chlorhexidine Gluconate Cloth  6 each Topical Q0600  . clopidogrel  75 mg Oral Q breakfast  . feeding supplement  1 Container Oral Q24H  . feeding supplement (PRO-STAT SUGAR FREE 64)  30 mL Oral BID  . heparin  5,000 Units Subcutaneous Q8H  . hydrALAZINE  25 mg Oral Q8H  . HYDROmorphone  2 mg Oral Q4H  . insulin aspart  0-9 Units Subcutaneous Q4H  . lipase/protease/amylase  24,000 Units Oral TID WC  . mometasone-formoterol  2 puff Inhalation BID  . pantoprazole  40 mg Oral Daily  . polyethylene glycol  17 g Oral Daily   Continuous  Infusions: PRN Meds:.diphenhydrAMINE, HYDROmorphone (DILAUDID) injection, ondansetron **OR** ondansetron (ZOFRAN) IV   ASSESMENT:   *Recurrent acute pancreatitis. Lipase 2164 >> 96. ?Groove pancreatitis. ?Malignancy. Initially had biliary pancreatitis in 04/2018. Chronic cholecystitis on 04/19/18 lap chole.  Recurrent pancreatitis in 05/2018. Non con CTs showing peripancreatic fluid collection.  Cystic duct stone on 07/10/18 MRI. [facility] MD conferred that this not likeley causing sxs. 09/08/18 CT scan without contrast: (probable groove) pancreatitis improved. No change in 8 cm complex process at pancreatic head, duodenal sweep.  Imaging options limited by inability to employ IV contrast.  However she is now HD pt so could we now make use of IV  contrast?   CA 19-9 51 (rr is 0 to 35).   Clinically better today with resolution of nausea, better pain control.  * Malnutrition, required feeding tube that caused bleeding esophageal ulcers, 04/2018 EGD. [facility]GE reported PTA, now resolved: carafate added to high dose PPI.Pt does not want to undergo EGD.  * Chronic Plavix for CAD.  Not on hold.    *   Chronic anemia.  Renal following, do not feel ESA needed currently.    * ESRD. HD yesterday, pre TTS schedule. Protocol is no IV contrast in 1st six months of HD, HD during late 07/2018 admission and began in earnest mid 08/2018.    * Afib.  *    Chronic narcotics, oral Dilaudid every 4 hours, at home for management of back and abdominal pain.   PLAN   *   Per Dr Carlean Purl.  *    Agree with advancing diet to full liquids, if tolerated could advance to renal diet.    Azucena Freed  09/13/2018, 10:31 AM Phone [phone]

## 2018-09-13 NOTE — Progress Notes (Addendum)
PROGRESS NOTE    [name]  DUK:[id number] DOB: Mar 08, 1951 DOA: 09/09/2018 PCP: Bernerd Limbo, MD   Brief Narrative:  HPI on 09/09/2017 by Dr. Dessa Phi ELVERTA [name] is a 68 y.o. female with medical history significant of ESRD on dialysis, abdominal aortic aneurysm status post repair, CAD who was recently admitted for pancreatitis returns again for abdominal pain.  Briefly, she was diagnosed with gallstone pancreatitis in August 2019, underwent cholecystectomy, has been repeatedly admitted for recurrent pancreatitis, pseudocyst.  She had a follow-up CT abdomen pelvis yesterday afternoon as an outpatient which revealed interval improvement of pancreatitis and inflammation and fluid surrounding the descending duodenum.  She states that after her CT scan, she started having worsening epigastric abdominal pain, nausea, vomiting. She denies any fevers, chills or shortness of breath.  She does admit to some central chest pain that is sharp in nature, radiating from her abdomen.  No worsening peripheral edema.  Interim history Admitted for recurrent pancreatitis.  Gastroenterology consulted and appreciated.  Assessment & Plan   Acute recurrent pancreatitis,? Groove pancreatitis -Complicated history of initially presumed gallstone pancreatitis post cholecystectomy in August 2019 with subsequent recurrent pancreatitis requiring hospital admissions -Patient being followed by Dr. Rush Landmark, [facility], underwent CT abdomen pelvis without contrast as an outpatient on 09/08/2018 which revealed some interval improvement of pancreatitis with inflammation of the fluid surrounding the descending duodenum.  No discrete pancreaticoduodenal mass found. -Gastroenterology consulted and appreciated -Patient placed on clear liquid diet and tolerated well.  Will transition to full liquids today. -Continue pain control.  Patient states that Percocet makes her itch and that she takes oral Dilaudid at home.  Have  transition patient to oral Dilaudid with IV Dilaudid for breakthrough -CA19-9 mildly elevated, GI feels this likely not cancer. CT with contrast ordered (with premedications)  Coffee-ground emesis -Continue PPI  ESRD -On hemodialysis, Tuesday, Thursday, Saturday -Nephrology consulted and appreciated  Elevated trop -likely demand ischemia -troponin trend flat -chest pain free  Coronary artery disease -Currently chest pain free -continue plavix and statin  Diabetes mellitus, type II -Continue insulin sliding scale, and CBG monitoring  Essential hypertension -Continue norvasc, hydralazine  Chronic hypoxemic respiratory failure with COPD -Stable, at baseline. Uses 2L of O2 at QHS  Chronic diastolic dysfunctin -Echocardiogram 05/30/2018 showed EF 60-65%, G2DD -Continue coreg  DVT Prophylaxis  Heparin  Code Status: Full  Family Communication: None at bedside  Disposition Plan: Admitted. Pending improvement and further GI recommendations. Suspect home when stable.   Consultants Gastroenterology Nephrology  Procedures  None  Antibiotics   Anti-infectives (From admission, onward)   None      Subjective:   [name] seen and examined today.  Continues to have pain, in her abdomen and back. States she was able to tolerate a clear liquid diet. Denies current chest pain, urgency of breath, nausea or vomiting, diarrhea or constipation, dizziness or headache.    Objective:   Vitals:   09/12/18 2156 09/13/18 0608 09/13/18 0815 09/13/18 1329  BP: (!) 143/47 (!) 147/74  (!) 144/51  Pulse: 66 60  62  Resp: 18 17  17   Temp: 97.8 F (36.6 C) 98.5 F (36.9 C)  98.4 F (36.9 C)  TempSrc: Oral Oral  Oral  SpO2: 97% 97% 96% 99%  Weight:        Intake/Output Summary (Last 24 hours) at 09/13/2018 1522 Last data filed at 09/13/2018 1330 Gross per 24 hour  Intake 954 ml  Output 2625 ml  Net -1671 ml  Filed Weights   09/11/18 1138 09/12/18 1335 09/12/18 1644  Weight:  57.6 kg 54.2 kg 51.1 kg    Exam  General: Well developed, chronically ill appearing, NAD  HEENT: NCAT, mucous membranes moist.   Neck: Supple  Cardiovascular: S1 S2 auscultated, RRR, +SEM  Respiratory: Clear to auscultation bilaterally with equal chest rise  Abdomen: Soft, mildly TTP, distended, + bowel sounds  Extremities: warm dry without cyanosis clubbing. LE edema   Neuro: AAOx3, nonfocal  Psych: Normal affect and demeanor   Data Reviewed: I have personally reviewed following labs and imaging studies  CBC: Recent Labs  Lab 09/09/18 1424 09/10/18 0118 09/10/18 1026 09/10/18 1804 09/11/18 0233 09/12/18 0332 09/13/18 0218  WBC 20.5* 24.4*  --   --   --   --  12.5*  HGB 11.7* 10.9* 10.7* 9.7* 9.4* 8.7* 9.2*  HCT 38.4 34.6*  --   --   --   --  29.1*  MCV 94.1 94.5  --   --   --   --  90.4  PLT 351 274  --   --   --   --  027   Basic Metabolic Panel: Recent Labs  Lab 09/09/18 1424 09/10/18 0118 09/11/18 0233 09/12/18 0332 09/13/18 0218  NA 137 138 136 136 135  K 3.1* 3.2* 3.3* 3.3* 4.2  CL 96* 100 102 98 99  CO2 27 21* 17* 27 30  GLUCOSE 164* 104* 76 127* 118*  BUN 41* 49* 62* 19 11  CREATININE 3.49* 3.75* 3.94* 2.29* 1.86*  CALCIUM 9.4 8.6* 8.0* 8.1* 8.3*   GFR: Estimated Creatinine Clearance: 24.3 mL/min (A) (by C-G formula based on SCr of 1.86 mg/dL (H)). Liver Function Tests: Recent Labs  Lab 09/09/18 1424  AST 22  ALT 11  ALKPHOS 46  BILITOT 1.1  PROT 6.5  ALBUMIN 3.3*   Recent Labs  Lab 09/09/18 1424 09/12/18 0332  LIPASE 2,164* 96*   No results for input(s): AMMONIA in the last 168 hours. Coagulation Profile: No results for input(s): INR, PROTIME in the last 168 hours. Cardiac Enzymes: Recent Labs  Lab 09/09/18 1918 09/10/18 0118 09/10/18 0713  TROPONINI 0.09* 0.11* 0.10*   BNP (last 3 results) No results for input(s): PROBNP in the last 8760 hours. HbA1C: No results for input(s): HGBA1C in the last 72  hours. CBG: Recent Labs  Lab 09/12/18 2354 09/13/18 0036 09/13/18 0356 09/13/18 0810 09/13/18 1234  GLUCAP 68* 88 110* 108* 158*   Lipid Profile: No results for input(s): CHOL, HDL, LDLCALC, TRIG, CHOLHDL, LDLDIRECT in the last 72 hours. Thyroid Function Tests: No results for input(s): TSH, T4TOTAL, FREET4, T3FREE, THYROIDAB in the last 72 hours. Anemia Panel: No results for input(s): VITAMINB12, FOLATE, FERRITIN, TIBC, IRON, RETICCTPCT in the last 72 hours. Urine analysis:    Component Value Date/Time   COLORURINE AMBER (A) 09/10/2018 0328   APPEARANCEUR CLOUDY (A) 09/10/2018 0328   LABSPEC 1.017 09/10/2018 0328   PHURINE 5.0 09/10/2018 0328   GLUCOSEU NEGATIVE 09/10/2018 0328   HGBUR NEGATIVE 09/10/2018 0328   BILIRUBINUR NEGATIVE 09/10/2018 0328   KETONESUR NEGATIVE 09/10/2018 0328   PROTEINUR 100 (A) 09/10/2018 0328   UROBILINOGEN 0.2 03/04/2011 2107   NITRITE NEGATIVE 09/10/2018 0328   LEUKOCYTESUR NEGATIVE 09/10/2018 0328   Sepsis Labs: @LABRCNTIP (procalcitonin:4,lacticidven:4)  ) Recent Results (from the past 240 hour(s))  Blood culture (routine x 2)     Status: None (Preliminary result)   Collection Time: 09/09/18  4:02 PM  Result Value Ref  Range Status   Specimen Description BLOOD RIGHT ANTECUBITAL  Final   Special Requests   Final    BOTTLES DRAWN AEROBIC AND ANAEROBIC Blood Culture results may not be optimal due to an excessive volume of blood received in culture bottles   Culture   Final    NO GROWTH 4 DAYS Performed at [facility] [address] [place], [place] [address]    Report Status PENDING  Incomplete  Blood culture (routine x 2)     Status: None (Preliminary result)   Collection Time: 09/09/18  7:18 PM  Result Value Ref Range Status   Specimen Description BLOOD RIGHT ANTECUBITAL  Final   Special Requests   Final    BOTTLES DRAWN AEROBIC ONLY Blood Culture adequate volume   Culture   Final    NO GROWTH 4 DAYS Performed at [facility], [place] [address] [place], [place] [address]    Report Status PENDING  Incomplete      Radiology Studies: No results found.   Scheduled Meds: . amLODipine  5 mg Oral Daily  . atorvastatin  40 mg Oral q1800  . carvedilol  12.5 mg Oral BID WC  . Chlorhexidine Gluconate Cloth  6 each Topical Q0600  . clopidogrel  75 mg Oral Q breakfast  . [START ON 09/14/2018] darbepoetin (ARANESP) injection - DIALYSIS  60 mcg Intravenous Q Thu-HD  . [START ON 09/14/2018] diphenhydrAMINE  50 mg Oral Once   Or  . [START ON 09/14/2018] diphenhydrAMINE  50 mg Intravenous Once  . feeding supplement  1 Container Oral Q24H  . feeding supplement (PRO-STAT SUGAR FREE 64)  30 mL Oral BID  . heparin  5,000 Units Subcutaneous Q8H  . hydrALAZINE  25 mg Oral Q8H  . HYDROmorphone  2 mg Oral Q4H  . insulin aspart  0-9 Units Subcutaneous Q4H  . lipase/protease/amylase  24,000 Units Oral TID WC  . mometasone-formoterol  2 puff Inhalation BID  . pantoprazole  40 mg Oral Daily  . polyethylene glycol  17 g Oral Daily  . predniSONE  50 mg Oral Q6H   Continuous Infusions:   LOS: 4 days   Time Spent in minutes   30 minutes  Celeste Candelas D.O. on 09/13/2018 at 3:22 PM  Between 7am to 7pm - Please see pager noted on [email or web address]  After 7pm go to [email or web address]  And look for the night coverage person covering for me after hours  [facility] Office  [phone]

## 2018-09-13 NOTE — Progress Notes (Signed)
[name] Progress Note   Subjective:  Seen and examined at bedside with daughter present. Feeling a little better this AM, especially after taking a shower.  Signed off dialysis 1 hour early yesterday due to abdominal pain.  Concerned about pain management and lack of protein supplements while NPO.  Reports intermittent SOB, improved with HD yesterday.  Denies CP, n/v/d.   Objective Vitals:   09/12/18 2103 09/12/18 2156 09/13/18 0608 09/13/18 0815  BP:  (!) 143/47 (!) 147/74   Pulse:  66 60   Resp:  18 17   Temp:  97.8 F (36.6 C) 98.5 F (36.9 C)   TempSrc:  Oral Oral   SpO2: 93% 97% 97% 96%  Weight:       Physical Exam General:NAD, chronically ill appearing, frail female  Heart:RRR, no MRG Lungs: mostly CTAB Abdomen:soft, NTND Extremities:2-3+LE edema in calf>feet Dialysis Access: LU AVF +b  Filed Weights   09/11/18 1138 09/12/18 1335 09/12/18 1644  Weight: 57.6 kg 54.2 kg 51.1 kg    Intake/Output Summary (Last 24 hours) at 09/13/2018 1027 Last data filed at 09/13/2018 0612 Gross per 24 hour  Intake 480 ml  Output 2625 ml  Net -2145 ml    Additional Objective Labs: Basic Metabolic Panel: Recent Labs  Lab 09/11/18 0233 09/12/18 0332 09/13/18 0218  NA 136 136 135  K 3.3* 3.3* 4.2  CL 102 98 99  CO2 17* 27 30  GLUCOSE 76 127* 118*  BUN 62* 19 11  CREATININE 3.94* 2.29* 1.86*  CALCIUM 8.0* 8.1* 8.3*   Liver Function Tests: Recent Labs  Lab 09/09/18 1424  AST 22  ALT 11  ALKPHOS 46  BILITOT 1.1  PROT 6.5  ALBUMIN 3.3*   Recent Labs  Lab 09/09/18 1424 09/12/18 0332  LIPASE 2,164* 96*   CBC: Recent Labs  Lab 09/09/18 1424 09/10/18 0118  09/11/18 0233 09/12/18 0332 09/13/18 0218  WBC 20.5* 24.4*  --   --   --  12.5*  HGB 11.7* 10.9*   < > 9.4* 8.7* 9.2*  HCT 38.4 34.6*  --   --   --  29.1*  MCV 94.1 94.5  --   --   --  90.4  PLT 351 274  --   --   --  185   < > = values in this interval not displayed.   Blood Culture   Component Value Date/Time   SDES BLOOD RIGHT ANTECUBITAL 09/09/2018 1918   SPECREQUEST  09/09/2018 1918    BOTTLES DRAWN AEROBIC ONLY Blood Culture adequate volume   CULT  09/09/2018 1918    NO GROWTH 4 DAYS Performed at [facility], [place] [address], [place], [place] [address]    REPTSTATUS PENDING 09/09/2018 1918    Cardiac Enzymes: Recent Labs  Lab 09/09/18 1918 09/10/18 0118 09/10/18 0713  TROPONINI 0.09* 0.11* 0.10*   CBG: Recent Labs  Lab 09/12/18 2026 09/12/18 2354 09/13/18 0036 09/13/18 0356 09/13/18 0810  GLUCAP 127* 68* 88 110* 108*   Lab Results  Component Value Date   INR 1.28 06/15/2018   INR 1.13 05/28/2018   INR 1.24 04/09/2018   Studies/Results: No results found.  Medications:  . amLODipine  5 mg Oral Daily  . atorvastatin  40 mg Oral q1800  . carvedilol  12.5 mg Oral BID WC  . Chlorhexidine Gluconate Cloth  6 each Topical Q0600  . clopidogrel  75 mg Oral Q breakfast  . feeding supplement  1 Container Oral  Q24H  . feeding supplement (PRO-STAT SUGAR FREE 64)  30 mL Oral BID  . heparin  5,000 Units Subcutaneous Q8H  . hydrALAZINE  25 mg Oral Q8H  . HYDROmorphone  2 mg Oral Q4H  . insulin aspart  0-9 Units Subcutaneous Q4H  . lipase/protease/amylase  24,000 Units Oral TID WC  . mometasone-formoterol  2 puff Inhalation BID  . pantoprazole  40 mg Oral Daily  . polyethylene glycol  17 g Oral Daily    Dialysis Orders: 4h 400/800 53kg  2/2 bath L AVF Heparin 2200  Mircera 11mcg IV 2 weeks (last 12/23) No VDRA   Assessment/Plan: 1. Recurrent Pancreatitis: Lipase 2,164>96. Concerned over pain management. Per GI/primary. 2. ESRD - On HD TTS, recent start.  K 4.2. Plan for HD tomorrow with 3K bath.  S/o early yesterday due to pain.  3. Anemia of CKD- Hgb 9.2. Aranesp 23mcg qwk w/HD tomorrow. Follow trends.  4. Secondary hyperparathyroidism - Ca in goal. Need to check phos. Not on VDRA/binders.  5. HTN/volume - BP elevated. +LE edema.   Under EDW, continue to titrate down as tolerated. Will need new EDW at d/c. 6. Nutrition -Last Alb 3.3. Renal diet w/fluid restrictions once advanced.  Added Prostat/Boost. 7. CAD   Jen Mow, PA-C [facility] Pager: [phone] 09/13/2018,10:27 AM  LOS: 4 days

## 2018-09-14 ENCOUNTER — Inpatient Hospital Stay (HOSPITAL_COMMUNITY): Payer: Medicare Other

## 2018-09-14 LAB — CULTURE, BLOOD (ROUTINE X 2)
CULTURE: NO GROWTH
Culture: NO GROWTH
Special Requests: ADEQUATE

## 2018-09-14 LAB — GLUCOSE, CAPILLARY
Glucose-Capillary: 148 mg/dL — ABNORMAL HIGH (ref 70–99)
Glucose-Capillary: 166 mg/dL — ABNORMAL HIGH (ref 70–99)
Glucose-Capillary: 197 mg/dL — ABNORMAL HIGH (ref 70–99)
Glucose-Capillary: 223 mg/dL — ABNORMAL HIGH (ref 70–99)
Glucose-Capillary: 231 mg/dL — ABNORMAL HIGH (ref 70–99)
Glucose-Capillary: 239 mg/dL — ABNORMAL HIGH (ref 70–99)

## 2018-09-14 MED ORDER — SODIUM CHLORIDE 0.9 % IV SOLN: 100.0000 mL | INTRAVENOUS | Status: DC | PRN

## 2018-09-14 MED ORDER — HYDROMORPHONE HCL 1 MG/ML IJ SOLN
1.0000 mg | INTRAMUSCULAR | Status: DC | PRN
  Administered 2018-09-14 – 2018-09-15 (×4): 1 mg via INTRAVENOUS
  Filled 2018-09-14 (×4): qty 1

## 2018-09-14 MED ORDER — IOHEXOL 300 MG/ML  SOLN
80.0000 mL | Freq: Once | INTRAMUSCULAR | Status: AC | PRN
  Administered 2018-09-14: 80 mL via INTRAVENOUS

## 2018-09-14 MED ORDER — INSULIN ASPART 100 UNIT/ML ~~LOC~~ SOLN
0.0000 [IU] | Freq: Three times a day (TID) | SUBCUTANEOUS | Status: DC
  Administered 2018-09-14: 3 [IU] via SUBCUTANEOUS
  Administered 2018-09-14 – 2018-09-15 (×2): 2 [IU] via SUBCUTANEOUS

## 2018-09-14 MED ORDER — LIDOCAINE-PRILOCAINE 2.5-2.5 % EX CREA
1.0000 "application " | TOPICAL_CREAM | CUTANEOUS | Status: DC | PRN
  Filled 2018-09-14: qty 5

## 2018-09-14 MED ORDER — PENTAFLUOROPROP-TETRAFLUOROETH EX AERO: 1.0000 "application " | INHALATION_SPRAY | CUTANEOUS | Status: DC | PRN

## 2018-09-14 MED ORDER — LIDOCAINE HCL (PF) 1 % IJ SOLN: 5.0000 mL | INTRAMUSCULAR | Status: DC | PRN

## 2018-09-14 MED ORDER — HEPARIN SODIUM (PORCINE) 1000 UNIT/ML DIALYSIS
1000.0000 [IU] | INTRAMUSCULAR | Status: DC | PRN
  Filled 2018-09-14: qty 1

## 2018-09-14 MED ORDER — HEPARIN SODIUM (PORCINE) 1000 UNIT/ML DIALYSIS
2200.0000 [IU] | INTRAMUSCULAR | Status: DC | PRN
  Administered 2018-09-15: 2200 [IU] via INTRAVENOUS_CENTRAL
  Filled 2018-09-14 (×2): qty 3

## 2018-09-14 MED ORDER — CAMPHOR-MENTHOL 0.5-0.5 % EX LOTN
TOPICAL_LOTION | CUTANEOUS | Status: DC | PRN
  Filled 2018-09-14: qty 222

## 2018-09-14 MED ORDER — ALTEPLASE 2 MG IJ SOLR: 2.0000 mg | Freq: Once | INTRAMUSCULAR | Status: DC | PRN

## 2018-09-14 NOTE — Progress Notes (Addendum)
[facility] Progress Note   Subjective:   Seen and examined at bedside.  No new complaints. LE edema improved w/elevating legs overnight.  Denies SOB, CP, dizziness, and n/v.  Objective Vitals:   09/13/18 2112 09/13/18 2115 09/14/18 0519 09/14/18 0843  BP: (!) 146/47  (!) 174/58   Pulse: 65  66   Resp:  14 12   Temp: 98.4 F (36.9 C)  98.1 F (36.7 C)   TempSrc: Oral  Oral   SpO2: 99%  97% 96%  Weight:       Physical Exam General:NAD, chronically ill appearing female, sitting on edge of bed Heart:RRR, no mrg Lungs:CTAB Abdomen:soft, NTND Extremities:1-2+ LE edema from ankles through feet Dialysis Access: LU AVF +b/t   Filed Weights   09/11/18 1138 09/12/18 1335 09/12/18 1644  Weight: 57.6 kg 54.2 kg 51.1 kg    Intake/Output Summary (Last 24 hours) at 09/14/2018 1141 Last data filed at 09/14/2018 0951 Gross per 24 hour  Intake 237 ml  Output -  Net 237 ml    Additional Objective Labs: Basic Metabolic Panel: Recent Labs  Lab 09/11/18 0233 09/12/18 0332 09/13/18 0218  NA 136 136 135  K 3.3* 3.3* 4.2  CL 102 98 99  CO2 17* 27 30  GLUCOSE 76 127* 118*  BUN 62* 19 11  CREATININE 3.94* 2.29* 1.86*  CALCIUM 8.0* 8.1* 8.3*   Liver Function Tests: Recent Labs  Lab 09/09/18 1424  AST 22  ALT 11  ALKPHOS 46  BILITOT 1.1  PROT 6.5  ALBUMIN 3.3*   Recent Labs  Lab 09/09/18 1424 09/12/18 0332  LIPASE 2,164* 96*   CBC: Recent Labs  Lab 09/09/18 1424 09/10/18 0118  09/11/18 0233 09/12/18 0332 09/13/18 0218  WBC 20.5* 24.4*  --   --   --  12.5*  HGB 11.7* 10.9*   < > 9.4* 8.7* 9.2*  HCT 38.4 34.6*  --   --   --  29.1*  MCV 94.1 94.5  --   --   --  90.4  PLT 351 274  --   --   --  185   < > = values in this interval not displayed.   Blood Culture    Component Value Date/Time   SDES BLOOD RIGHT ANTECUBITAL 09/09/2018 1918   SPECREQUEST  09/09/2018 1918    BOTTLES DRAWN AEROBIC ONLY Blood Culture adequate volume   CULT  09/09/2018  1918    NO GROWTH 5 DAYS Performed at [facility], [place] [address] [place], [place] [address]    REPTSTATUS 09/14/2018 FINAL 09/09/2018 1918    Cardiac Enzymes: Recent Labs  Lab 09/09/18 1918 09/10/18 0118 09/10/18 0713  TROPONINI 0.09* 0.11* 0.10*   CBG: Recent Labs  Lab 09/13/18 2016 09/13/18 2149 09/13/18 2358 09/14/18 0313 09/14/18 0810  GLUCAP 68* 117* 197* 223* 148*   Studies/Results: Ct Abdomen Pelvis W Contrast  Result Date: 09/14/2018 CLINICAL DATA:  Pancreatic mass following pancreatitis. Follow-up exam. EXAM: CT ABDOMEN AND PELVIS WITH CONTRAST TECHNIQUE: Multidetector CT imaging of the abdomen and pelvis was performed using the standard protocol following bolus administration of intravenous contrast. CONTRAST:  68mL OMNIPAQUE IOHEXOL 300 MG/ML  SOLN COMPARISON:  09/08/2018 FINDINGS: Lower chest: Mild edema identified within the lung bases. No pleural effusions. Hepatobiliary: No focal liver abnormality. Previous cholecystectomy. A small remnant of the gallbladder remains which contains a small stone measuring 3 mm.Mild fusiform increase caliber of the CBD measures up to 9 mm. No choledocholithiasis  identified. Pancreas: Pancreas divisum anatomy is identified. Edema involving the pancreas and surrounding soft tissues noted. There is been interval improvement and abnormal enlargement of the pancreatic head due to inflammation and edema. Mild increase caliber of the dorsal duct contains gas. No discrete mass identified. No pancreatic or peripancreatic fluid collections identified. Spleen: Normal in size without focal abnormality. Adrenals/Urinary Tract: The adrenal glands appear normal. Bilateral renal calcifications appear vascular in nature. No hydronephrosis identified bilaterally. Urinary bladder is unremarkable. Stomach/Bowel: Stomach normal. No abnormal small bowel dilatation identified. Postoperative changes at the cecum and distal colon noted. No abnormally dilated  loops of large bowel noted. Vascular/Lymphatic: Aortic atherosclerosis. Status post stent graft repair of infrarenal abdominal aortic aneurysm. The aneurysm sac measures 4.5 cm in maximum AP dimension. Extensive aortic branch vessel atherosclerotic calcifications noted. No adenopathy within the abdomen or pelvis. Reproductive: Status post hysterectomy. No adnexal masses. Other: Right upper quadrant fluid surrounding the descending duodenum is again noted and appears stable to slightly improved in the interval. Trace ascites noted within the posterior pelvis. Musculoskeletal: No acute or significant osseous findings. IMPRESSION: 1. Interval improvement in edema and enlargement involving the pancreatic head. No pseudo cyst identified at this time. 2. Pancreas divisum variant suspected. Gas is now seen within the mildly dilated pancreatic duct. No gas seen within the common bile duct. 3. Persistent fluid within the right upper quadrant of the abdomen centered around the descending portion of the duodenum. 4. Status post cholecystectomy. Small common bile duct remnant contains 3 mm stone. 5. Aortic atherosclerosis with abdominal aortic aneurysm status post stent graft repair. The aneurysm sac measures 4.5 cm in maximum dimension. Aortic Atherosclerosis (ICD10-I70.0). Electronically Signed   By: Kerby Moors M.D.   On: 09/14/2018 10:58    Medications:  . amLODipine  5 mg Oral Daily  . atorvastatin  40 mg Oral q1800  . carvedilol  12.5 mg Oral BID WC  . Chlorhexidine Gluconate Cloth  6 each Topical Q0600  . clopidogrel  75 mg Oral Q breakfast  . darbepoetin (ARANESP) injection - DIALYSIS  60 mcg Intravenous Q Thu-HD  . feeding supplement  1 Container Oral Q24H  . feeding supplement (PRO-STAT SUGAR FREE 64)  30 mL Oral BID  . heparin  5,000 Units Subcutaneous Q8H  . hydrALAZINE  25 mg Oral Q8H  . HYDROmorphone  2 mg Oral Q4H  . insulin aspart  0-9 Units Subcutaneous Q4H  . lipase/protease/amylase  24,000  Units Oral TID WC  . mometasone-formoterol  2 puff Inhalation BID  . multivitamin  1 tablet Oral QHS  . pantoprazole  40 mg Oral Daily  . polyethylene glycol  17 g Oral Daily    Dialysis Orders: 4h 400/800 53kg2/2bath L AVF Heparin 2200  Mircera 50mcg IV 2 weeks (last 12/23) No VDRA   Assessment/Plan: 1. Recurrent Pancreatitis: Lipase 2,164>96.  Per GI CA 19-9 only midly elevated, so believe CA likely not an issue.  CT today shows improvement in pancreatic enlargement/edema. Per GI/primary. 2. ESRD - On HD TTS, recent start.  K 4.2. Plan for HD today per regular schedule.  3. Anemia of CKD- Hgb 9.2. Aranesp 56mcg qwk w/HD today. Follow trends.  4. Secondary hyperparathyroidism - Ca in goal. RFP ordered pre HD today. Not on VDRA/binders.  5. HTN/volume - BP elevated. +LE edema.  Under EDW, continue to titrate down as tolerated. Will need new EDW at d/c. 6. Nutrition -Last Alb 3.3. Renal diet w/fluid restrictions once advanced.  Added Prostat/Boost. 7.  CAD  Jen Mow, PA-C [facility] Pager: [phone] 09/14/2018,11:41 AM  LOS: 5 days   Pt seen, examined and agree w A/P as above. HD today.  Kelly Splinter MD [employer] pager [phone]   09/14/2018, 1:06 PM

## 2018-09-14 NOTE — Progress Notes (Addendum)
Daily Rounding Note  09/14/2018, 11:08 AM  LOS: 5 days   SUBJECTIVE:   Chief complaint: acute pancreatitis.     Nausea gone, tolerating full liquids.   Pain contd but overall better.  Currently meeting with RN unit director re pt feeling overnight nursing staff not getting her pain meds to her in a timely manner.  Breakthrough pain with both po and IV dilaudid.  Pt likes getting IV formula as it works quicker.     OBJECTIVE:         Vital signs in last 24 hours:    Temp:  [98.1 F (36.7 C)-98.4 F (36.9 C)] 98.1 F (36.7 C) (01/09 0519) Pulse Rate:  [62-66] 66 (01/09 0519) Resp:  [12-17] 12 (01/09 0519) BP: (144-174)/(47-58) 174/58 (01/09 0519) SpO2:  [96 %-99 %] 96 % (01/09 0843) Last BM Date: 09/09/18 Filed Weights   09/11/18 1138 09/12/18 1335 09/12/18 1644  Weight: 57.6 kg 54.2 kg 51.1 kg   General: looks better but chron ill looking   Heart: RRR Chest: clear bil.   Abdomen: soft, NT to moderate pressure.  ND.  Active BS.  Faint bruits bilaterally. Extremities: slight, non-pitting pedal edema Neuro/Psych:  Oriented x 3.  No tremor or gross weakness.    Intake/Output from previous day: 01/08 0701 - 01/09 0700 In: 474 [P.O.:474] Out: -   Lab Results: Recent Labs    09/12/18 0332 09/13/18 0218  WBC  --  12.5*  HGB 8.7* 9.2*  HCT  --  29.1*  PLT  --  185   BMET Recent Labs    09/12/18 0332 09/13/18 0218  NA 136 135  K 3.3* 4.2  CL 98 99  CO2 27 30  GLUCOSE 127* 118*  BUN 19 11  CREATININE 2.29* 1.86*  CALCIUM 8.1* 8.3*    Studies/Results: Ct Abdomen Pelvis W Contrast  Result Date: 09/14/2018 CLINICAL DATA:  Pancreatic mass following pancreatitis. Follow-up exam. EXAM: CT ABDOMEN AND PELVIS WITH CONTRAST TECHNIQUE: Multidetector CT imaging of the abdomen and pelvis was performed using the standard protocol following bolus administration of intravenous contrast. CONTRAST:  54mL OMNIPAQUE IOHEXOL  300 MG/ML  SOLN COMPARISON:  09/08/2018 FINDINGS: Lower chest: Mild edema identified within the lung bases. No pleural effusions. Hepatobiliary: No focal liver abnormality. Previous cholecystectomy. A small remnant of the gallbladder remains which contains a small stone measuring 3 mm.Mild fusiform increase caliber of the CBD measures up to 9 mm. No choledocholithiasis identified. Pancreas: Pancreas divisum anatomy is identified. Edema involving the pancreas and surrounding soft tissues noted. There is been interval improvement and abnormal enlargement of the pancreatic head due to inflammation and edema. Mild increase caliber of the dorsal duct contains gas. No discrete mass identified. No pancreatic or peripancreatic fluid collections identified. Spleen: Normal in size without focal abnormality. Adrenals/Urinary Tract: The adrenal glands appear normal. Bilateral renal calcifications appear vascular in nature. No hydronephrosis identified bilaterally. Urinary bladder is unremarkable. Stomach/Bowel: Stomach normal. No abnormal small bowel dilatation identified. Postoperative changes at the cecum and distal colon noted. No abnormally dilated loops of large bowel noted. Vascular/Lymphatic: Aortic atherosclerosis. Status post stent graft repair of infrarenal abdominal aortic aneurysm. The aneurysm sac measures 4.5 cm in maximum AP dimension. Extensive aortic branch vessel atherosclerotic calcifications noted. No adenopathy within the abdomen or pelvis. Reproductive: Status post hysterectomy. No adnexal masses. Other: Right upper quadrant fluid surrounding the descending duodenum is again noted and appears stable to slightly improved in  the interval. Trace ascites noted within the posterior pelvis. Musculoskeletal: No acute or significant osseous findings. IMPRESSION: 1. Interval improvement in edema and enlargement involving the pancreatic head. No pseudo cyst identified at this time. 2. Pancreas divisum variant  suspected. Gas is now seen within the mildly dilated pancreatic duct. No gas seen within the common bile duct. 3. Persistent fluid within the right upper quadrant of the abdomen centered around the descending portion of the duodenum. 4. Status post cholecystectomy. Small common bile duct remnant contains 3 mm stone. 5. Aortic atherosclerosis with abdominal aortic aneurysm status post stent graft repair. The aneurysm sac measures 4.5 cm in maximum dimension. Aortic Atherosclerosis (ICD10-I70.0). Electronically Signed   By: Kerby Moors M.D.   On: 09/14/2018 10:58    ASSESMENT:   *   Recurrent pancreatitis Contrasted CT this morning shows overall improvement in the acute pancreatitis without fluid collection.  + Pancreas divisum.  Persistent, slightly improved, RUQ fluid in region of descending duodenum.  Remnant gallbladder contains small, 3 mm stone.  Mild, fusiform increased CBD, up to 9 mm caliber.  Postop changes in the cecum, distal colon.  S/p stent graft repair of AAA which measures 4.5 cm.    *   CAD.  Chronic Plavix  *   Anemia, chronic.    *   ESRD, on HD for ~ 4 to 5 weeks.    *   Chronic pain.      PLAN   *   Added K pad, start low fat diet at dinner.  Leave narcotics mgt to Dr Ree Kida and hospitalists.      Azucena Freed  09/14/2018, 11:08 AM Phone [place] Attending   I have taken an interval history, reviewed the chart and examined the patient. I agree with the Advanced Practitioner's note, impression and recommendations.   She is improved - clinically and radiographically.  Am hopeful she may be dced tomorrow.  This will take time to heal.  Will need f/u Dr. Thea Alken, MD, [facility] 09/14/2018 5:55 PM Pager [phone]

## 2018-09-14 NOTE — Plan of Care (Signed)

## 2018-09-14 NOTE — Progress Notes (Addendum)
Nutrition Follow-up  DOCUMENTATION CODES:   Non-severe (moderate) malnutrition in context of chronic illness, Underweight  INTERVENTION:   -Continue Boost Breeze po TID, each supplement provides 250 kcal and 9 grams of protein -Continue 30 ml Prostat TID, each supplement provides 100 kcals and 15 grams protein -D/c Magic cup TID with meals, each supplement provides 290 kcal and 9 grams of protein -Continue Renal MVI daily  NUTRITION DIAGNOSIS:   Moderate Malnutrition related to chronic illness(pancreatitis, ESRD on HD) as evidenced by energy intake < or equal to 75% for > or equal to 1 month, mild fat depletion, moderate fat depletion, mild muscle depletion, moderate muscle depletion, edema.  Ongoing  GOAL:   Patient will meet greater than or equal to 90% of their needs  Progressing  MONITOR:   PO intake, Supplement acceptance, Diet advancement, Labs, Weight trends, Skin, I & O's  REASON FOR ASSESSMENT:   Other (Comment)    ASSESSMENT:   [name] is a 68 y.o. female with medical history significant of ESRD on dialysis, abdominal aortic aneurysm status post repair, CAD who was recently admitted for pancreatitis returns again for abdominal pain.  Briefly, she was diagnosed with gallstone pancreatitis in August 2019, underwent cholecystectomy, has been repeatedly admitted for recurrent pancreatitis, pseudocyst.  She had a follow-up CT abdomen pelvis yesterday afternoon as an outpatient which revealed interval improvement of pancreatitis and inflammation and fluid surrounding the descending duodenum.  She states that after her CT scan, she started having worsening epigastric abdominal pain, nausea, vomiting. She denies any fevers, chills or shortness of breath.  She does admit to some central chest pain that is sharp in nature, radiating from her abdomen.  No worsening peripheral edema.  Pt sleeping soundly at time of visit. RD did not disturb.   Reviewed meal completion  data; PO 50-100%.   Pt advanced to a renal diet with 1.2 L fluid restriction today. Pt remains compliant with her supplements. Reached out to renal PA regarding liberalizing diet (due to history of poor oral intake and malnutrition), however, PA stated she would like to keep pt on current diet due to this being the first day of having solid foods.   ADDENDUM (1610): Paged by RN, who reports that pt is upset that she is on a renal diet and the limited food options on diet. This RD informed this RN that this RD did reach out to nephrology regarding diet advancement, however, would like to wait to see how pt does prior to diet advancement. RN requested that this RD call and speak with pt regarding this. Called pt on room phone. Explained to pt that this RD did reach out to nephrology for diet liberalization, however, MD would like to wait and see how she does with solid foods first. Pt expressed frustration due to need to gain weight. Pt does not like Magic Cup ("it's nasty- it tastes like sand"). RD offered to d/c Magic Cup to to free up nutrients for other options; pt agreeable to plan. Pt shares she is anxious for potential discharge tomorrow, so she can go home "to eat what I want". Encouraged continued supplements.   Medications reviewed and include creon.   Labs reviewed: corrected calcium: 8.9, K: 3.2, CBGS: 94(inpatient orders for glycemic control are 0-9 units insulin aspart TID with meals).   Diet Order:   Diet Order            Diet renal with fluid restriction Fluid restriction: 1200 mL Fluid; Room  service appropriate? Yes; Fluid consistency: Thin  Diet effective now              EDUCATION NEEDS:   Education needs have been addressed  Skin:  Skin Assessment: Skin Integrity Issues: Skin Integrity Issues:: Other (Comment) Other: MASD buttocks  Last BM:  09/09/18  Height:   Ht Readings from Last 1 Encounters:  08/17/18 5\' 6"  (1.676 m)    Weight:   Wt Readings from Last 1  Encounters:  09/12/18 51.1 kg    Ideal Body Weight:  59.1 kg  BMI:  Body mass index is 18.18 kg/m.  Estimated Nutritional Needs:   Kcal:  1600-1800  Protein:  85-100 grams  Fluid:  1000 ml + UOP    [name] A. Jimmye Norman, RD, LDN, CDE Pager: [phone] After hours Pager: [phone]

## 2018-09-14 NOTE — Progress Notes (Signed)
PROGRESS NOTE    [name]  ZOX:[id number] DOB: [birth year] DOA: 09/09/2018 PCP: Amy Limbo, MD   Brief Narrative:  HPI on 09/09/2017 by Dr. Dessa [name] [name] is a 68 y.o. female with medical history significant of ESRD on dialysis, abdominal aortic aneurysm status post repair, CAD who was recently admitted for pancreatitis returns again for abdominal pain.  Briefly, she was diagnosed with gallstone pancreatitis in August 2019, underwent cholecystectomy, has been repeatedly admitted for recurrent pancreatitis, pseudocyst.  She had a follow-up CT abdomen pelvis yesterday afternoon as an outpatient which revealed interval improvement of pancreatitis and inflammation and fluid surrounding the descending duodenum.  She states that after her CT scan, she started having worsening epigastric abdominal pain, nausea, vomiting. She denies any fevers, chills or shortness of breath.  She does admit to some central chest pain that is sharp in nature, radiating from her abdomen.  No worsening peripheral edema.  Interim history Admitted for recurrent pancreatitis.  Gastroenterology consulted and appreciated.  Assessment & Plan   Acute recurrent pancreatitis,? Groove pancreatitis -Complicated history of initially presumed gallstone pancreatitis post cholecystectomy in August 2019 with subsequent recurrent pancreatitis requiring hospital admissions -Patient being followed by Dr. Rush [name], [facility], underwent CT abdomen pelvis without contrast as an outpatient on 09/08/2018 which revealed some interval improvement of pancreatitis with inflammation of the fluid surrounding the descending duodenum.  No discrete pancreaticoduodenal mass found. -Gastroenterology consulted and appreciated -Patient placed on clear liquid diet and tolerated well.  Will transition to full liquids today. -Continue pain control.  Patient states that Percocet makes her itch and that she takes oral Dilaudid at home.  Have  transition patient to oral Dilaudid with IV Dilaudid for breakthrough -CA19-9 mildly elevated, GI feels this likely not cancer.  -CT abd/pelvis w/ contrast: Notable improvement in edema and enlargement involving the pancreatic head.  No pseudocyst identified.  Pancreas divisum variant suspected.  Gas is now seen within the mildly dilated pancreatic duct.  No gas within the common bile duct.  Persistent fluid within the right upper quadrant of the abdomen. -Patient was able to tolerate full liquid diet; denies any nausea or vomiting.  Continues to have abdominal pain and back pain.  Coffee-ground emesis -Continue PPI  ESRD -On hemodialysis, Tuesday, Thursday, Saturday -Nephrology consulted and appreciated  Elevated trop -likely demand ischemia -troponin trend flat -chest pain free  Coronary artery disease -Currently chest pain free -continue plavix and statin  Diabetes mellitus, type II -Continue insulin sliding scale, and CBG monitoring  Essential hypertension -Continue norvasc, hydralazine  Chronic hypoxemic respiratory failure with COPD -Stable, at baseline. Uses 2L of O2 at QHS  Chronic diastolic dysfunctin -Echocardiogram 05/30/2018 showed EF 60-65%, G2DD -Continue coreg  DVT Prophylaxis  Heparin  Code Status: Full  Family Communication: None at bedside  Disposition Plan: Admitted. Pending improvement and further GI recommendations. Suspect home when stable, hopefully within in the next day or so.  Consultants Gastroenterology Nephrology  Procedures  None  Antibiotics   Anti-infectives (From admission, onward)   None      Subjective:   [name] seen and examined today.  Continues to complain of back pain and abdominal pain but feels it is not due to fluid intake.  Denies current chest pain, shortness of breath, diarrhea constipation, nausea or vomiting, headache or dizziness.   Objective:   Vitals:   09/13/18 2112 09/13/18 2115 09/14/18 0519  09/14/18 0843  BP: (!) 146/47  (!) 174/58   Pulse: 65  66   Resp:  14 12   Temp: 98.4 F (36.9 C)  98.1 F (36.7 C)   TempSrc: Oral  Oral   SpO2: 99%  97% 96%  Weight:        Intake/Output Summary (Last 24 hours) at 09/14/2018 1156 Last data filed at 09/14/2018 0951 Gross per 24 hour  Intake 237 ml  Output -  Net 237 ml   Filed Weights   09/11/18 1138 09/12/18 1335 09/12/18 1644  Weight: 57.6 kg 54.2 kg 51.1 kg   Exam  General: Well developed, chronically ill appearing, NAD.  HEENT: NCAT, mucous membranes moist.   Neck: Supple  Cardiovascular: S1 S2 auscultated, RRR, +SEM  Respiratory: Clear to auscultation bilaterally with equal chest rise  Abdomen: Soft, nontender, nondistended, + bowel sounds  Extremities: warm dry without cyanosis clubbing. LE Edema.   Neuro: AAOx3, nonfocal  Psych: Appropriate mood and affect  Data Reviewed: I have personally reviewed following labs and imaging studies  CBC: Recent Labs  Lab 09/09/18 1424 09/10/18 0118 09/10/18 1026 09/10/18 1804 09/11/18 0233 09/12/18 0332 09/13/18 0218  WBC 20.5* 24.4*  --   --   --   --  12.5*  HGB 11.7* 10.9* 10.7* 9.7* 9.4* 8.7* 9.2*  HCT 38.4 34.6*  --   --   --   --  29.1*  MCV 94.1 94.5  --   --   --   --  90.4  PLT 351 274  --   --   --   --  213   Basic Metabolic Panel: Recent Labs  Lab 09/09/18 1424 09/10/18 0118 09/11/18 0233 09/12/18 0332 09/13/18 0218  NA 137 138 136 136 135  K 3.1* 3.2* 3.3* 3.3* 4.2  CL 96* 100 102 98 99  CO2 27 21* 17* 27 30  GLUCOSE 164* 104* 76 127* 118*  BUN 41* 49* 62* 19 11  CREATININE 3.49* 3.75* 3.94* 2.29* 1.86*  CALCIUM 9.4 8.6* 8.0* 8.1* 8.3*   GFR: Estimated Creatinine Clearance: 24.3 mL/min (A) (by C-G formula based on SCr of 1.86 mg/dL (H)). Liver Function Tests: Recent Labs  Lab 09/09/18 1424  AST 22  ALT 11  ALKPHOS 46  BILITOT 1.1  PROT 6.5  ALBUMIN 3.3*   Recent Labs  Lab 09/09/18 1424 09/12/18 0332  LIPASE 2,164* 96*    No results for input(s): AMMONIA in the last 168 hours. Coagulation Profile: No results for input(s): INR, PROTIME in the last 168 hours. Cardiac Enzymes: Recent Labs  Lab 09/09/18 1918 09/10/18 0118 09/10/18 0713  TROPONINI 0.09* 0.11* 0.10*   BNP (last 3 results) No results for input(s): PROBNP in the last 8760 hours. HbA1C: No results for input(s): HGBA1C in the last 72 hours. CBG: Recent Labs  Lab 09/13/18 2016 09/13/18 2149 09/13/18 2358 09/14/18 0313 09/14/18 0810  GLUCAP 68* 117* 197* 223* 148*   Lipid Profile: No results for input(s): CHOL, HDL, LDLCALC, TRIG, CHOLHDL, LDLDIRECT in the last 72 hours. Thyroid Function Tests: No results for input(s): TSH, T4TOTAL, FREET4, T3FREE, THYROIDAB in the last 72 hours. Anemia Panel: No results for input(s): VITAMINB12, FOLATE, FERRITIN, TIBC, IRON, RETICCTPCT in the last 72 hours. Urine analysis:    Component Value Date/Time   COLORURINE AMBER (A) 09/10/2018 0328   APPEARANCEUR CLOUDY (A) 09/10/2018 0328   LABSPEC 1.017 09/10/2018 0328   PHURINE 5.0 09/10/2018 0328   GLUCOSEU NEGATIVE 09/10/2018 0328   HGBUR NEGATIVE 09/10/2018 [place] NEGATIVE 09/10/2018 0328   Benjamin Stain  NEGATIVE 09/10/2018 0328   PROTEINUR 100 (A) 09/10/2018 0328   UROBILINOGEN 0.2 03/04/2011 2107   NITRITE NEGATIVE 09/10/2018 0328   LEUKOCYTESUR NEGATIVE 09/10/2018 0328   Sepsis Labs: @LABRCNTIP (procalcitonin:4,lacticidven:4)  ) Recent Results (from the past 240 hour(s))  Blood culture (routine x 2)     Status: None   Collection Time: 09/09/18  4:02 PM  Result Value Ref Range Status   Specimen Description BLOOD RIGHT ANTECUBITAL  Final   Special Requests   Final    BOTTLES DRAWN AEROBIC AND ANAEROBIC Blood Culture results may not be optimal due to an excessive volume of blood received in culture bottles   Culture   Final    NO GROWTH 5 DAYS Performed at [facility] [address] [place], [place] [address]     Report Status 09/14/2018 FINAL  Final  Blood culture (routine x 2)     Status: None   Collection Time: 09/09/18  7:18 PM  Result Value Ref Range Status   Specimen Description BLOOD RIGHT ANTECUBITAL  Final   Special Requests   Final    BOTTLES DRAWN AEROBIC ONLY Blood Culture adequate volume   Culture   Final    NO GROWTH 5 DAYS Performed at [facility], [place] [address], [place], [place] [address]    Report Status 09/14/2018 FINAL  Final      Radiology Studies: Ct Abdomen Pelvis W Contrast  Result Date: 09/14/2018 CLINICAL DATA:  Pancreatic mass following pancreatitis. Follow-up exam. EXAM: CT ABDOMEN AND PELVIS WITH CONTRAST TECHNIQUE: Multidetector CT imaging of the abdomen and pelvis was performed using the standard protocol following bolus administration of intravenous contrast. CONTRAST:  19mL OMNIPAQUE IOHEXOL 300 MG/ML  SOLN COMPARISON:  09/08/2018 FINDINGS: Lower chest: Mild edema identified within the lung bases. No pleural effusions. Hepatobiliary: No focal liver abnormality. Previous cholecystectomy. A small remnant of the gallbladder remains which contains a small stone measuring 3 mm.Mild fusiform increase caliber of the CBD measures up to 9 mm. No choledocholithiasis identified. Pancreas: Pancreas divisum anatomy is identified. Edema involving the pancreas and surrounding soft tissues noted. There is been interval improvement and abnormal enlargement of the pancreatic head due to inflammation and edema. Mild increase caliber of the dorsal duct contains gas. No discrete mass identified. No pancreatic or peripancreatic fluid collections identified. Spleen: Normal in size without focal abnormality. Adrenals/Urinary Tract: The adrenal glands appear normal. Bilateral renal calcifications appear vascular in nature. No hydronephrosis identified bilaterally. Urinary bladder is unremarkable. Stomach/Bowel: Stomach normal. No abnormal small bowel dilatation identified. Postoperative  changes at the cecum and distal colon noted. No abnormally dilated loops of large bowel noted. Vascular/Lymphatic: Aortic atherosclerosis. Status post stent graft repair of infrarenal abdominal aortic aneurysm. The aneurysm sac measures 4.5 cm in maximum AP dimension. Extensive aortic branch vessel atherosclerotic calcifications noted. No adenopathy within the abdomen or pelvis. Reproductive: Status post hysterectomy. No adnexal masses. Other: Right upper quadrant fluid surrounding the descending duodenum is again noted and appears stable to slightly improved in the interval. Trace ascites noted within the posterior pelvis. Musculoskeletal: No acute or significant osseous findings. IMPRESSION: 1. Interval improvement in edema and enlargement involving the pancreatic head. No pseudo cyst identified at this time. 2. Pancreas divisum variant suspected. Gas is now seen within the mildly dilated pancreatic duct. No gas seen within the common bile duct. 3. Persistent fluid within the right upper quadrant of the abdomen centered around the descending portion of the duodenum. 4. Status post cholecystectomy.  Small common bile duct remnant contains 3 mm stone. 5. Aortic atherosclerosis with abdominal aortic aneurysm status post stent graft repair. The aneurysm sac measures 4.5 cm in maximum dimension. Aortic Atherosclerosis (ICD10-I70.0). Electronically Signed   By: Kerby Moors M.D.   On: 09/14/2018 10:58     Scheduled Meds: . amLODipine  5 mg Oral Daily  . atorvastatin  40 mg Oral q1800  . carvedilol  12.5 mg Oral BID WC  . Chlorhexidine Gluconate Cloth  6 each Topical Q0600  . clopidogrel  75 mg Oral Q breakfast  . darbepoetin (ARANESP) injection - DIALYSIS  60 mcg Intravenous Q Thu-HD  . feeding supplement  1 Container Oral Q24H  . feeding supplement (PRO-STAT SUGAR FREE 64)  30 mL Oral BID  . heparin  5,000 Units Subcutaneous Q8H  . hydrALAZINE  25 mg Oral Q8H  . HYDROmorphone  2 mg Oral Q4H  . insulin  aspart  0-9 Units Subcutaneous Q4H  . lipase/protease/amylase  24,000 Units Oral TID WC  . mometasone-formoterol  2 puff Inhalation BID  . multivitamin  1 tablet Oral QHS  . pantoprazole  40 mg Oral Daily  . polyethylene glycol  17 g Oral Daily   Continuous Infusions:   LOS: 5 days   Time Spent in minutes   30 minutes  Suad Autrey D.O. on 09/14/2018 at 11:56 AM  Between 7am to 7pm - Please see pager noted on [email or web address]  After 7pm go to [email or web address]  And look for the night coverage person covering for me after hours  [facility] Office  [phone]

## 2018-09-15 DIAGNOSIS — I5032 Chronic diastolic (congestive) heart failure: Secondary | ICD-10-CM

## 2018-09-15 DIAGNOSIS — I25119 Atherosclerotic heart disease of native coronary artery with unspecified angina pectoris: Secondary | ICD-10-CM

## 2018-09-15 DIAGNOSIS — N186 End stage renal disease: Secondary | ICD-10-CM

## 2018-09-15 DIAGNOSIS — I10 Essential (primary) hypertension: Secondary | ICD-10-CM

## 2018-09-15 LAB — RENAL FUNCTION PANEL
Albumin: 2.5 g/dL — ABNORMAL LOW (ref 3.5–5.0)
Albumin: 2.8 g/dL — ABNORMAL LOW (ref 3.5–5.0)
Anion gap: 12 (ref 5–15)
Anion gap: 12 (ref 5–15)
BUN: 52 mg/dL — ABNORMAL HIGH (ref 8–23)
BUN: 22 mg/dL (ref 8–23)
CHLORIDE: 93 mmol/L — AB (ref 98–111)
CO2: 24 mmol/L (ref 22–32)
CO2: 29 mmol/L (ref 22–32)
CREATININE: 1.86 mg/dL — AB (ref 0.44–1.00)
Calcium: 8.4 mg/dL — ABNORMAL LOW (ref 8.9–10.3)
Calcium: 8.1 mg/dL — ABNORMAL LOW (ref 8.9–10.3)
Chloride: 94 mmol/L — ABNORMAL LOW (ref 98–111)
Creatinine, Ser: 3.16 mg/dL — ABNORMAL HIGH (ref 0.44–1.00)
GFR calc Af Amer: 17 mL/min — ABNORMAL LOW (ref >60)
GFR calc Af Amer: 32 mL/min — ABNORMAL LOW (ref >60)
GFR calc non Af Amer: 15 mL/min — ABNORMAL LOW (ref >60)
GFR calc non Af Amer: 28 mL/min — ABNORMAL LOW (ref >60)
Glucose, Bld: 241 mg/dL — ABNORMAL HIGH (ref 70–99)
Glucose, Bld: 190 mg/dL — ABNORMAL HIGH (ref 70–99)
Phosphorus: 4.8 mg/dL — ABNORMAL HIGH (ref 2.5–4.6)
Phosphorus: 3.2 mg/dL (ref 2.5–4.6)
Potassium: 3.2 mmol/L — ABNORMAL LOW (ref 3.5–5.1)
Potassium: 3.5 mmol/L (ref 3.5–5.1)
Sodium: 130 mmol/L — ABNORMAL LOW (ref 135–145)
Sodium: 134 mmol/L — ABNORMAL LOW (ref 135–145)

## 2018-09-15 LAB — CBC
HCT: 27.9 % — ABNORMAL LOW (ref 36.0–46.0)
Hemoglobin: 9 g/dL — ABNORMAL LOW (ref 12.0–15.0)
MCH: 29.3 pg (ref 26.0–34.0)
MCHC: 32.3 g/dL (ref 30.0–36.0)
MCV: 90.9 fL (ref 80.0–100.0)
NRBC: 0 % (ref 0.0–0.2)
Platelets: 204 10*3/uL (ref 150–400)
RBC: 3.07 MIL/uL — ABNORMAL LOW (ref 3.87–5.11)
RDW: 17.2 % — ABNORMAL HIGH (ref 11.5–15.5)
WBC: 7 10*3/uL (ref 4.0–10.5)

## 2018-09-15 LAB — HEMOGLOBIN AND HEMATOCRIT, BLOOD
HCT: 31.1 % — ABNORMAL LOW (ref 36.0–46.0)
Hemoglobin: 9.8 g/dL — ABNORMAL LOW (ref 12.0–15.0)

## 2018-09-15 LAB — GLUCOSE, CAPILLARY: Glucose-Capillary: 153 mg/dL — ABNORMAL HIGH (ref 70–99)

## 2018-09-15 MED ORDER — HEPARIN SODIUM (PORCINE) 1000 UNIT/ML IJ SOLN
INTRAMUSCULAR | Status: AC
  Administered 2018-09-15: 2200 [IU] via INTRAVENOUS_CENTRAL
  Filled 2018-09-15: qty 3

## 2018-09-15 MED ORDER — POTASSIUM CHLORIDE CRYS ER 20 MEQ PO TBCR
20.0000 meq | EXTENDED_RELEASE_TABLET | Freq: Once | ORAL | Status: AC
  Administered 2018-09-15: 20 meq via ORAL
  Filled 2018-09-15: qty 1

## 2018-09-15 NOTE — Discharge Summary (Signed)
Physician Discharge Summary  [name] AYT:[id number] DOB: Mar 29, 1951 DOA: 09/09/2018  PCP: Bernerd Limbo, MD  Admit date: 09/09/2018 Discharge date: 09/15/2018  Time spent: 45 minutes  Recommendations for Outpatient Follow-up:  Patient will be discharged to home.  Patient will need to follow up with primary care provider within one week of discharge.  Follow up with gastroenterology. Continue hemodialysis as scheduled. Patient should continue medications as prescribed.  Patient should follow a renal/carb modified diet.   Discharge Diagnoses:  Principle diagnosis: Acute recurrent pancreatitis,? Groove pancreatitis Coffee-ground emesis ESRD Elevated trop Coronary artery disease Diabetes mellitus, type II Essential hypertension Chronic hypoxemic respiratory failure with COPD Chronic diastolic dysfunctin  Discharge Condition: Stable  Diet recommendation: as above  Filed Weights   09/12/18 1644 09/15/18 0015 09/15/18 0357  Weight: 51.1 kg 55 kg 51.1 kg    History of present illness:  on 09/09/2017 by Dr. Philippa Chester Bogeris a 68 y.o.femalewith medical history significant ofESRD on dialysis, abdominal aortic aneurysm status post repair, CAD who was recently admitted for pancreatitis returns again for abdominal pain. Briefly, she was diagnosed with gallstone pancreatitis in August 2019, underwent cholecystectomy, has been repeatedly admitted for recurrent pancreatitis, pseudocyst.She had a follow-up CT abdomen pelvis yesterday afternoon as an outpatient which revealed interval improvement of pancreatitis and inflammation and fluid surrounding the descending duodenum. She states that after her CT scan, she started having worsening epigastric abdominal pain, nausea, vomiting. Shedenies any fevers, chills or shortness of breath. She does admit to some central chest pain that is sharp in nature, radiating from her abdomen. No worsening peripheral edema.  Hospital Course:    Acute recurrent pancreatitis,? Groove pancreatitis -Complicated history of initially presumed gallstone pancreatitis post cholecystectomy in August 2019 with subsequent recurrent pancreatitis requiring hospital admissions -Patient being followed by Dr. Rush Landmark, [facility], underwent CT abdomen pelvis without contrast as an outpatient on 09/08/2018 which revealed some interval improvement of pancreatitis with inflammation of the fluid surrounding the descending duodenum.  No discrete pancreaticoduodenal mass found. -Gastroenterology consulted and appreciated -Patient placed on clear liquid diet and tolerated well.  Will transition to full liquids today. -Continue pain control.  Patient states that Percocet makes her itch and that she takes oral Dilaudid at home.  Have transition patient to oral Dilaudid with IV Dilaudid for breakthrough -CA19-9 mildly elevated, GI feels this likely not cancer.  -CT abd/pelvis w/ contrast: Notable improvement in edema and enlargement involving the pancreatic head.  No pseudocyst identified.  Pancreas divisum variant suspected.  Gas is now seen within the mildly dilated pancreatic duct.  No gas within the common bile duct.  Persistent fluid within the right upper quadrant of the abdomen. -Diet advanced to renal and patient able to tolerate -GI recommending outpatient follow up with Dr. Rush Landmark. This may take time to heal.  Coffee-ground emesis -Continue PPI  ESRD -On hemodialysis, Tuesday, Thursday, Saturday -Nephrology consulted and appreciated  Elevated trop -likely demand ischemia -troponin trend flat -chest pain free  Coronary artery disease -Currently chest pain free -continue plavix and statin  Diabetes mellitus, type II -Continue home medications on discharge  Essential hypertension -Continue norvasc, hydralazine  Chronic hypoxemic respiratory failure with COPD -Stable, at baseline. Uses 2L of O2 at [facility]  Chronic diastolic  dysfunctin -Echocardiogram 05/30/2018 showed EF 60-65%, G2DD -Continue coreg  Procedures: none  Consultations: Gastroenterology Nephrology  Discharge Exam: Vitals:   09/15/18 0833 09/15/18 1020  BP:  (!) 168/58  Pulse:  64  Resp:  Temp:    SpO2: 99% 100%   Patient states she is ready to go home.  Denies any abdominal pain, nausea or vomiting, diarrhea or constipation.  Denies chest pain, shortness breath, dizziness or headache.  Does continue to complain of chronic back pain.   General: Well developed, chronically ill appearing, NAD  HEENT: NCAT, mucous membranes moist.  Neck: Supple  Cardiovascular: S1 S2 auscultated, RRR, SEM  Respiratory: Clear to auscultation bilaterally with equal chest rise  Abdomen: Soft, nontender, nondistended, + bowel sounds  Extremities: warm dry without cyanosis clubbing. LE edema B/L   Neuro: AAOx3, nonfocal  Psych: Appropriate mood and affect  Discharge Instructions Discharge Instructions    Discharge instructions   Complete by:  As directed    Patient will be discharged to home.  Patient will need to follow up with primary care provider within one week of discharge.  Follow up with gastroenterology. Continue hemodialysis as scheduled. Patient should continue medications as prescribed.  Patient should follow a renal/carb modified diet. Discuss fenofibrate with PCP or nephrologist.     Allergies as of 09/15/2018      Reactions   Isosorbide Other (See Comments)   Can only tolerate in low doses (unknown reaction)   Isosorbide Nitrate Other (See Comments)   Can only tolerate in low doses (unknown reaction)   Oxycodone-acetaminophen Itching   Tolerates Hydrocodone.  Also tolerated oxycodone 07/13/18 admission.     Codeine Itching   Contrast Media [iodinated Diagnostic Agents] Itching, Other (See Comments)   Itching of feet   Ioxaglate Itching, Other (See Comments)   Itching of feet   Metrizamide Itching, Other (See Comments)    Itching of feet   Percocet [oxycodone-acetaminophen] Itching, Other (See Comments)   Tolerates Hydrocodone      Medication List    STOP taking these medications   ferrous sulfate 325 (65 FE) MG tablet   Vitamin B-12 5000 MCG Subl     TAKE these medications   acetaminophen 325 MG tablet Commonly known as:  TYLENOL Take 325 mg by mouth every 6 (six) hours as needed (for pain).   allopurinol 100 MG tablet Commonly known as:  ZYLOPRIM Take 100 mg by mouth daily.   amLODipine 5 MG tablet Commonly known as:  NORVASC Take 1 tablet (5 mg total) by mouth daily.   atorvastatin 40 MG tablet Commonly known as:  LIPITOR Take 40 mg by mouth daily.   budesonide-formoterol 160-4.5 MCG/ACT inhaler Commonly known as:  SYMBICORT Inhale 2 puffs into the lungs 2 (two) times daily.   calcium carbonate 750 MG chewable tablet Commonly known as:  TUMS EX Chew 2 tablets by mouth 3 (three) times daily as needed for heartburn.   carvedilol 12.5 MG tablet Commonly known as:  COREG Take 1 tablet (12.5 mg total) by mouth 2 (two) times daily with a meal.   clopidogrel 75 MG tablet Commonly known as:  PLAVIX Take 1 tablet (75 mg total) by mouth daily with breakfast.   DIALYVITE PO Take 1 tablet by mouth daily.   feeding supplement (PRO-STAT SUGAR FREE 64) Liqd Take 30 mLs by mouth 3 (three) times daily with meals.   feeding supplement Liqd Commonly known as:  BOOST / RESOURCE BREEZE Take 1 Container by mouth 2 (two) times daily.   fenofibrate 160 MG tablet Take 1 tablet (160 mg total) by mouth daily.   gabapentin 300 MG capsule Commonly known as:  NEURONTIN Take 1 capsule (300 mg total) by mouth at bedtime.  hydrALAZINE 25 MG tablet Commonly known as:  APRESOLINE Take 1 tablet (25 mg total) by mouth every 8 (eight) hours.   HYDROmorphone 2 MG tablet Commonly known as:  DILAUDID Take 2 mg by mouth every 4 (four) hours.   loperamide 2 MG tablet Commonly known as:  IMODIUM  A-D Take 2 mg by mouth 4 (four) times daily as needed for diarrhea or loose stools.   OXYGEN Inhale into the lungs at bedtime.   Pancrelipase (Lip-Prot-Amyl) 24000-76000 units Cpep Take 1 capsule (24,000 Units total) by mouth 3 (three) times daily with meals.   pantoprazole 40 MG tablet Commonly known as:  PROTONIX Take 1 tablet (40 mg total) by mouth daily.   polyethylene glycol packet Commonly known as:  MIRALAX / GLYCOLAX Take 17 g by mouth daily as needed for mild constipation.   PROAIR RESPICLICK 016 (90 Base) MCG/ACT Aepb Generic drug:  Albuterol Sulfate Inhale 1 puff into the lungs every 4 (four) hours as needed (shortness of breath/wheezing). What changed:  Another medication with the same name was removed. Continue taking this medication, and follow the directions you see here.      Allergies  Allergen Reactions  . Isosorbide Other (See Comments)    Can only tolerate in low doses (unknown reaction)  . Isosorbide Nitrate Other (See Comments)    Can only tolerate in low doses (unknown reaction)  . Oxycodone-Acetaminophen Itching    Tolerates Hydrocodone.  Also tolerated oxycodone 07/13/18 admission.    . Codeine Itching  . Contrast Media [Iodinated Diagnostic Agents] Itching and Other (See Comments)    Itching of feet  . Ioxaglate Itching and Other (See Comments)    Itching of feet  . Metrizamide Itching and Other (See Comments)    Itching of feet  . Percocet [Oxycodone-Acetaminophen] Itching and Other (See Comments)    Tolerates Hydrocodone   Follow-up Information    Bernerd Limbo, MD. Schedule an appointment as soon as possible for a visit in 1 week(s).   Specialty:  Family Medicine Why:  Hospital follow up Contact information: [place] [address] [phone]        Sherren Mocha, MD .   Specialty:  Cardiology Contact information: [address] [place]  [address] [phone]            The results of significant diagnostics from this hospitalization (including imaging, microbiology, ancillary and laboratory) are listed below for reference.    Significant Diagnostic Studies: Ct Abdomen Pelvis Wo Contrast  Result Date: 09/08/2018 CLINICAL DATA:  History of pancreatitis. EXAM: CT ABDOMEN AND PELVIS WITHOUT CONTRAST TECHNIQUE: Multidetector CT imaging of the abdomen and pelvis was performed following the standard protocol without IV contrast. COMPARISON:  CT scan 08/01/2018 and MRI abdomen 07/10/2018 FINDINGS: Lower chest: The lung bases are grossly clear. No acute infiltrates or effusions. The heart is enlarged but stable. Stable coronary artery calcifications and aortic calcifications. Hepatobiliary: No focal hepatic lesions or intrahepatic biliary dilatation. The gallbladder is surgically absent. Mild associated common bile duct dilatation. Pancreas: Improved inflammation and edema involving the pancreatic head and adjacent second portion the duodenum. There is some fluid between the duodenum and the liver but this is slightly smaller. Findings likely represent changes of resolving groove pancreatitis. No findings for duodenal obstruction. No pancreatic ductal dilatation. Spleen: Normal size.  No focal lesions. Adrenals/Urinary Tract: The adrenal glands and kidneys are unremarkable and stable. Stable extensive renal artery calcifications.  No hydronephrosis. The bladder is grossly normal. Stomach/Bowel: The stomach, duodenum, small bowel and colon are grossly normal. Stable surgical changes involving the sigmoid colon. No acute inflammatory changes, mass lesions or obstructive findings. Vascular/Lymphatic: Stable severe atherosclerotic calcifications involving the aorta, branch vessels and iliac arteries. The patient has a stable appearing aortoiliac stent graft without complicating features. No mesenteric or retroperitoneal mass or adenopathy. Small  scattered lymph nodes are stable. Reproductive: Surgically absent. Other: No pelvic mass or free pelvic fluid collections. No inguinal mass or adenopathy. Musculoskeletal: No significant bony findings. IMPRESSION: 1. Some interval improvement of the probable groove pancreatitis and inflammation and fluid surrounding the descending duodenum. No discrete pancreatic or duodenal mass is identified. 2. Status post cholecystectomy with mild biliary dilatation. 3. Advanced atherosclerotic disease involving the aorta and iliac arteries. Electronically Signed   By: Marijo Sanes M.D.   On: 09/08/2018 15:40   Dg Abd 1 View  Result Date: 08/18/2018 CLINICAL DATA:  Chronic kidney disease and pulmonary edema. EXAM: ABDOMEN - 1 VIEW COMPARISON:  07/08/2018 FINDINGS: Bowel gas pattern is nonobstructive. No evidence of free peritoneal air. Surgical clips over the right upper quadrant. Aortoiliac stent graft unchanged. Surgical sutures over the pelvis. Calcified plaque over the aortoiliac vessels. Remainder of the exam is unchanged. IMPRESSION: Nonobstructive bowel gas pattern. Electronically Signed   By: Marin Olp M.D.   On: 08/18/2018 10:20   US Abdomen Complete  Result Date: 08/18/2018 CLINICAL DATA:  Abdominal pain for a month. Cholecystectomy. Pancreatitis. EXAM: ABDOMEN ULTRASOUND COMPLETE COMPARISON:  CT scans July 10, 2018 and August 01, 2018 FINDINGS: Gallbladder: Patient is status post cholecystectomy. A stone is again identified in the gallbladder stump measuring 9 mm today versus 6 mm on the recent CT scan. The difference in size is probably technical. Common bile duct: Diameter: 8 mm Liver: No focal mass. The liver is enlarged to 20 cm. Portal vein is patent on color Doppler imaging with normal direction of blood flow towards the liver. IVC: No abnormality visualized. Pancreas: A complex hypoechoic region is seen inferior to the pancreatic head measuring 4.8 x 3.8 x 5.0 cm. Spleen: Size and appearance  within normal limits. Right Kidney: Length: 10.7 cm.  Increased cortical echogenicity. Left Kidney: Length: 10.2 cm.  Increased cortical echogenicity. Abdominal aorta: No aneurysm visualized. Other findings: None. IMPRESSION: 1. There is a hypoechoic masslike region measuring 4.8 x 3.8 x 5.0 cm inferior to the pancreatic head. Given previous CT imaging, this could represent hematoma or biloma. Infection is considered less likely as is a neoplasm. Recommend attention on follow-up. 2. Small stone in the gallbladder stump. The patient is status post cholecystectomy. 3. Mild ascites. 4. Common bile duct dilatation may simply be due to previous cholecystectomy. Recommend correlation with labs. 5. Hepatomegaly. 6. Increased echogenicity in the renal cortices consistent with medical renal disease. Electronically Signed   By: Dorise Bullion III M.D   On: 08/18/2018 16:02   Ct Abdomen Pelvis W Contrast  Result Date: 09/14/2018 CLINICAL DATA:  Pancreatic mass following pancreatitis. Follow-up exam. EXAM: CT ABDOMEN AND PELVIS WITH CONTRAST TECHNIQUE: Multidetector CT imaging of the abdomen and pelvis was performed using the standard protocol following bolus administration of intravenous contrast. CONTRAST:  22mL OMNIPAQUE IOHEXOL 300 MG/ML  SOLN COMPARISON:  09/08/2018 FINDINGS: Lower chest: Mild edema identified within the lung bases. No pleural effusions. Hepatobiliary: No focal liver abnormality. Previous cholecystectomy. A small remnant of the gallbladder remains which contains a small stone measuring 3 mm.Mild fusiform increase  caliber of the CBD measures up to 9 mm. No choledocholithiasis identified. Pancreas: Pancreas divisum anatomy is identified. Edema involving the pancreas and surrounding soft tissues noted. There is been interval improvement and abnormal enlargement of the pancreatic head due to inflammation and edema. Mild increase caliber of the dorsal duct contains gas. No discrete mass identified. No  pancreatic or peripancreatic fluid collections identified. Spleen: Normal in size without focal abnormality. Adrenals/Urinary Tract: The adrenal glands appear normal. Bilateral renal calcifications appear vascular in nature. No hydronephrosis identified bilaterally. Urinary bladder is unremarkable. Stomach/Bowel: Stomach normal. No abnormal small bowel dilatation identified. Postoperative changes at the cecum and distal colon noted. No abnormally dilated loops of large bowel noted. Vascular/Lymphatic: Aortic atherosclerosis. Status post stent graft repair of infrarenal abdominal aortic aneurysm. The aneurysm sac measures 4.5 cm in maximum AP dimension. Extensive aortic branch vessel atherosclerotic calcifications noted. No adenopathy within the abdomen or pelvis. Reproductive: Status post hysterectomy. No adnexal masses. Other: Right upper quadrant fluid surrounding the descending duodenum is again noted and appears stable to slightly improved in the interval. Trace ascites noted within the posterior pelvis. Musculoskeletal: No acute or significant osseous findings. IMPRESSION: 1. Interval improvement in edema and enlargement involving the pancreatic head. No pseudo cyst identified at this time. 2. Pancreas divisum variant suspected. Gas is now seen within the mildly dilated pancreatic duct. No gas seen within the common bile duct. 3. Persistent fluid within the right upper quadrant of the abdomen centered around the descending portion of the duodenum. 4. Status post cholecystectomy. Small common bile duct remnant contains 3 mm stone. 5. Aortic atherosclerosis with abdominal aortic aneurysm status post stent graft repair. The aneurysm sac measures 4.5 cm in maximum dimension. Aortic Atherosclerosis (ICD10-I70.0). Electronically Signed   By: Kerby Moors M.D.   On: 09/14/2018 10:58   Dg Acute Abd 2+v Abd (supine,erect,decub) + 1v Chest  Result Date: 09/09/2018 CLINICAL DATA:  Abdominal pain and vomiting.  Missed  dialysis today. EXAM: DG ABDOMEN ACUTE W/ 1V CHEST COMPARISON:  09/08/2018 FINDINGS: Mild enlargement of the cardiopericardial silhouette with interstitial accentuation and Kerley B lines favoring acute interstitial pulmonary edema. Prior CABG. Prosthetic mitral valve. Atrial clip. Atherosclerotic calcification of the aortic arch. Aorta bi-iliac stent grafts traversing a rim calcified abdominal aortic aneurysm. Bilateral renal stents. Staple line along the rectum. Metal structure probably outside of the patient projects over the left lower pelvis. There is gas in the colon and in the stomach, small bowel is relatively gasless. No dilated bowel. IMPRESSION: 1. Interstitial pulmonary edema.  Mild cardiomegaly 2. No abnormality of the bowel gas pattern is identified although the small bowel is relatively gasless which can reduce negative predictive value. 3. Atherosclerosis. Aorta bi-iliac stent graft traverses an abdominal aortic aneurysm. Electronically Signed   By: Van Clines M.D.   On: 09/09/2018 17:05    Microbiology: Recent Results (from the past 240 hour(s))  Blood culture (routine x 2)     Status: None   Collection Time: 09/09/18  4:02 PM  Result Value Ref Range Status   Specimen Description BLOOD RIGHT ANTECUBITAL  Final   Special Requests   Final    BOTTLES DRAWN AEROBIC AND ANAEROBIC Blood Culture results may not be optimal due to an excessive volume of blood received in culture bottles   Culture   Final    NO GROWTH 5 DAYS Performed at [facility], [place] [address], [place], [place] [address]    Report Status 09/14/2018 FINAL  Final  Blood culture (routine x 2)     Status: None   Collection Time: 09/09/18  7:18 PM  Result Value Ref Range Status   Specimen Description BLOOD RIGHT ANTECUBITAL  Final   Special Requests   Final    BOTTLES DRAWN AEROBIC ONLY Blood Culture adequate volume   Culture   Final    NO GROWTH 5 DAYS Performed at [facility], [address], [place], [place] [address]    Report Status 09/14/2018 FINAL  Final     Labs: Basic Metabolic Panel: Recent Labs  Lab 09/11/18 0233 09/12/18 0332 09/13/18 0218 09/15/18 0102 09/15/18 0543  NA 136 136 135 130* 134*  K 3.3* 3.3* 4.2 3.2* 3.5  CL 102 98 99 94* 93*  CO2 17* 27 30 24 29   GLUCOSE 76 127* 118* 241* 190*  BUN 62* 19 11 52* 22  CREATININE 3.94* 2.29* 1.86* 3.16* 1.86*  CALCIUM 8.0* 8.1* 8.3* 8.4* 8.1*  PHOS  --   --   --  4.8* 3.2   Liver Function Tests: Recent Labs  Lab 09/09/18 1424 09/15/18 0102 09/15/18 0543  AST 22  --   --   ALT 11  --   --   ALKPHOS 46  --   --   BILITOT 1.1  --   --   PROT 6.5  --   --   ALBUMIN 3.3* 2.5* 2.8*   Recent Labs  Lab 09/09/18 1424 09/12/18 0332  LIPASE 2,164* 96*   No results for input(s): AMMONIA in the last 168 hours. CBC: Recent Labs  Lab 09/09/18 1424 09/10/18 0118  09/11/18 0233 09/12/18 0332 09/13/18 0218 09/15/18 0102 09/15/18 0543  WBC 20.5* 24.4*  --   --   --  12.5* 7.0  --   HGB 11.7* 10.9*   < > 9.4* 8.7* 9.2* 9.0* 9.8*  HCT 38.4 34.6*  --   --   --  29.1* 27.9* 31.1*  MCV 94.1 94.5  --   --   --  90.4 90.9  --   PLT 351 274  --   --   --  185 204  --    < > = values in this interval not displayed.   Cardiac Enzymes: Recent Labs  Lab 09/09/18 1918 09/10/18 0118 09/10/18 0713  TROPONINI 0.09* 0.11* 0.10*   BNP: BNP (last 3 results) Recent Labs    04/18/18 0501 05/28/18 2325 08/14/18 2201  BNP 1,099.7* 2,077.1* 2,774.6*    ProBNP (last 3 results) No results for input(s): PROBNP in the last 8760 hours.  CBG: Recent Labs  Lab 09/14/18 0810 09/14/18 1216 09/14/18 1603 09/14/18 2053 09/15/18 0832  GLUCAP 148* 231* 166* 239* 153*       Signed:  Evona Westra  [facility] 09/15/2018, 10:37 AM

## 2018-09-15 NOTE — Progress Notes (Signed)
HD tx ended 35 min early @ 0300 d/t having to get off to go to the bathroom UF goal still met Blood rinsed back VSS Report called to [place], Therapist, sports

## 2018-09-15 NOTE — Discharge Instructions (Signed)
Acute Pancreatitis ° °Acute pancreatitis happens when the pancreas gets swollen. The pancreas is a large gland behind the stomach. The pancreas helps control blood sugar. It also makes enzymes that help digest food. This condition happens when the enzymes attack the pancreas and damage it. Most attacks last a couple of days and are dangerous. The lungs, heart, and kidneys may stop working. °What are the causes? °· Alcohol abuse. °· Drug abuse. °· Gallstones. °· Some medicines. °· Some chemicals. °· Infection. °· Damage caused by an accident.. °· Belly (abdominal) surgery. °· In some cases, the cause is not known. °What are the signs or symptoms? °· Pain in the upper belly and back. °· Swelling of the belly °· Feeling sick to your stomach (nausea) and throwing up (vomiting). °How is this treated? °· You will probably have to stay in the hospital. °? Treatment may include: °§ Fluid through an IV. °§ A tube to remove stomach contents and stop you from throwing up. °§ Not eating for 3-4 days. °§ Pain medicine. °§ Antibiotic medicines if you have an infection. °§ Surgery on the pancreas or gallbladder. °Follow these instructions at home: °Eating and drinking ° °· Follow instructions from your doctor about diet. °· Eat small meals often. Avoid eating big meals. °· Eat foods that do not have a lot of fat in them. °· Drink enough fluid to keep your pee (urine) pale yellow. °· Do not drink alcohol if it caused your condition. °General instructions °· Take over-the-counter and prescription medicines only as told by your doctor. °· Do not use cigarettes, e-cigarettes, and chewing tobacco. If you need help quitting, ask your doctor. °· Get plenty of rest. °· If directed, check your blood sugar at home as told by your doctor. °· Keep all follow-up visits as told by your doctor. This is important. °Contact a doctor if: °· You do not get better as quickly as expected. °· You have new symptoms. °· Your symptoms get worse. °· You  have lasting pain or weakness. °· You continue to feel sick to your stomach. °· You get better and then you have another pain attack. °· You have a fever. °Get help right away if: °· You cannot eat or keep fluids down. °· Your pain becomes very bad. °· Your skin or the white part of your eyes turns yellow. °· You throw up. °· You feel dizzy or you pass out. °· Your blood sugar is high (over 300 mg/dL). °Summary °· Acute pancreatitis happens when the pancreas gets swollen. °· This condition is usually caused by alcohol abuse, drug abuse, or gallstones. °· You will probably have to stay in the hospital for treatment. °This information is not intended to replace advice given to you by your health care provider. Make sure you discuss any questions you have with your health care provider. °Document Released: 02/09/2008 Document Revised: 12/27/2016 Document Reviewed: 05/27/2015 °Elsevier Interactive Patient Education © 2019 [employer] ° °

## 2018-09-15 NOTE — Progress Notes (Signed)
HD tx initiated via 15Gx2 w/o problem Pull/push/flush well w/o problem VSS Will continue to monitor while on HD tx 

## 2018-09-15 NOTE — Progress Notes (Signed)
Daily Rounding Note  09/15/2018, 9:16 AM  LOS: 6 days   SUBJECTIVE:   Chief complaint: Recurrent, acute pancreatitis Pain in back.  Diminished pain in abdomen.  No nausea, tolerating solids.    OBJECTIVE:         Vital signs in last 24 hours:    Temp:  [97.7 F (36.5 C)-98.4 F (36.9 C)] 97.7 F (36.5 C) (01/10 0518) Pulse Rate:  [65-75] 66 (01/10 0518) Resp:  [11-17] 14 (01/10 0518) BP: (154-180)/(46-74) 180/59 (01/10 0518) SpO2:  [99 %-100 %] 99 % (01/10 0833) Weight:  [51.1 kg-55 kg] 51.1 kg (01/10 0357) Last BM Date: 09/15/18 Filed Weights   09/12/18 1644 09/15/18 0015 09/15/18 0357  Weight: 51.1 kg 55 kg 51.1 kg   General: looks poorly   Heart: RRR Chest: clear Abdomen: soft, minor upper belly tenderness.    Extremities: no CCE Neuro/Psych:  Anxious, concerned.  Oriented x 3.  No gross deficits.    Intake/Output from previous day: 01/09 0701 - 01/10 0700 In: 300 [P.O.:300] Out: 2142   Intake/Output this shift: No intake/output data recorded.  Lab Results: Recent Labs    09/13/18 0218 09/15/18 0102 09/15/18 0543  WBC 12.5* 7.0  --   HGB 9.2* 9.0* 9.8*  HCT 29.1* 27.9* 31.1*  PLT 185 204  --    BMET Recent Labs    09/13/18 0218 09/15/18 0102 09/15/18 0543  NA 135 130* 134*  K 4.2 3.2* 3.5  CL 99 94* 93*  CO2 30 24 29   GLUCOSE 118* 241* 190*  BUN 11 52* 22  CREATININE 1.86* 3.16* 1.86*  CALCIUM 8.3* 8.4* 8.1*   LFT Recent Labs    09/15/18 0102 09/15/18 0543  ALBUMIN 2.5* 2.8*   PT/INR No results for input(s): LABPROT, INR in the last 72 hours. Hepatitis Panel No results for input(s): HEPBSAG, HCVAB, HEPAIGM, HEPBIGM in the last 72 hours.  Studies/Results: Ct Abdomen Pelvis W Contrast  Result Date: 09/14/2018 CLINICAL DATA:  Pancreatic mass following pancreatitis. Follow-up exam. EXAM: CT ABDOMEN AND PELVIS WITH CONTRAST TECHNIQUE: Multidetector CT imaging of the abdomen and  pelvis was performed using the standard protocol following bolus administration of intravenous contrast. CONTRAST:  57mL OMNIPAQUE IOHEXOL 300 MG/ML  SOLN COMPARISON:  09/08/2018 FINDINGS: Lower chest: Mild edema identified within the lung bases. No pleural effusions. Hepatobiliary: No focal liver abnormality. Previous cholecystectomy. A small remnant of the gallbladder remains which contains a small stone measuring 3 mm.Mild fusiform increase caliber of the CBD measures up to 9 mm. No choledocholithiasis identified. Pancreas: Pancreas divisum anatomy is identified. Edema involving the pancreas and surrounding soft tissues noted. There is been interval improvement and abnormal enlargement of the pancreatic head due to inflammation and edema. Mild increase caliber of the dorsal duct contains gas. No discrete mass identified. No pancreatic or peripancreatic fluid collections identified. Spleen: Normal in size without focal abnormality. Adrenals/Urinary Tract: The adrenal glands appear normal. Bilateral renal calcifications appear vascular in nature. No hydronephrosis identified bilaterally. Urinary bladder is unremarkable. Stomach/Bowel: Stomach normal. No abnormal small bowel dilatation identified. Postoperative changes at the cecum and distal colon noted. No abnormally dilated loops of large bowel noted. Vascular/Lymphatic: Aortic atherosclerosis. Status post stent graft repair of infrarenal abdominal aortic aneurysm. The aneurysm sac measures 4.5 cm in maximum AP dimension. Extensive aortic branch vessel atherosclerotic calcifications noted. No adenopathy within the abdomen or pelvis. Reproductive: Status post hysterectomy. No adnexal masses. Other: Right upper quadrant fluid  surrounding the descending duodenum is again noted and appears stable to slightly improved in the interval. Trace ascites noted within the posterior pelvis. Musculoskeletal: No acute or significant osseous findings. IMPRESSION: 1. Interval  improvement in edema and enlargement involving the pancreatic head. No pseudo cyst identified at this time. 2. Pancreas divisum variant suspected. Gas is now seen within the mildly dilated pancreatic duct. No gas seen within the common bile duct. 3. Persistent fluid within the right upper quadrant of the abdomen centered around the descending portion of the duodenum. 4. Status post cholecystectomy. Small common bile duct remnant contains 3 mm stone. 5. Aortic atherosclerosis with abdominal aortic aneurysm status post stent graft repair. The aneurysm sac measures 4.5 cm in maximum dimension. Aortic Atherosclerosis (ICD10-I70.0). Electronically Signed   By: Kerby Moors M.D.   On: 09/14/2018 10:58   Scheduled Meds: . amLODipine  5 mg Oral Daily  . atorvastatin  40 mg Oral q1800  . carvedilol  12.5 mg Oral BID WC  . Chlorhexidine Gluconate Cloth  6 each Topical Q0600  . clopidogrel  75 mg Oral Q breakfast  . darbepoetin (ARANESP) injection - DIALYSIS  60 mcg Intravenous Q Thu-HD  . feeding supplement  1 Container Oral Q24H  . feeding supplement (PRO-STAT SUGAR FREE 64)  30 mL Oral BID  . heparin  5,000 Units Subcutaneous Q8H  . hydrALAZINE  25 mg Oral Q8H  . HYDROmorphone  2 mg Oral Q4H  . insulin aspart  0-9 Units Subcutaneous TID AC & HS  . lipase/protease/amylase  24,000 Units Oral TID WC  . mometasone-formoterol  2 puff Inhalation BID  . multivitamin  1 tablet Oral QHS  . pantoprazole  40 mg Oral Daily  . polyethylene glycol  17 g Oral Daily   Continuous Infusions: . sodium chloride    . sodium chloride     PRN Meds:.sodium chloride, sodium chloride, alteplase, camphor-menthol, diphenhydrAMINE, heparin, heparin, HYDROmorphone (DILAUDID) injection, lidocaine (PF), lidocaine-prilocaine, ondansetron **OR** ondansetron (ZOFRAN) IV, pentafluoroprop-tetrafluoroeth   ASSESMENT:   *   Recurrent acute pancreatitis Contrasted CT shows overall improvement in the acute pancreatitis without  fluid collection.  + Pancreas divisum.  Persistent, slightly improved, RUQ fluid in region of descending duodenum.  Remnant gallbladder contains small, 3 mm stone.  Mild, fusiform increased CBD, up to 9 mm caliber.  Postop changes in the cecum, distal colon.  S/p stent graft repair of AAA which measures 4.5 cm.    *   CAD.  Chronic Plavix  *   Anemia, chronic.    *   ESRD, on HD for ~ 4 to 5 weeks.    *   Chronic pain.  Chronic narcotics for a long time.    *   Anxiety, depression   PLAN   *  Has ROV with Dr Rush Landmark on 1/17 at 9 AM.   Going home today.    1 x daily Protonix.      [name]  09/15/2018, 9:16 AM Phone [phone]

## 2018-09-15 NOTE — Progress Notes (Addendum)
[facility] Progress Note   Subjective:   Seen and examined at bedside.  Reports she had to sign off HD 71min early b/c of BM.  Denies pain, SOB, n/v, weakness and dizziness.  To d/c today.  Objective Vitals:   09/15/18 0327 09/15/18 0357 09/15/18 0518 09/15/18 0833  BP: (!) 156/71  (!) 180/59   Pulse: 73  66   Resp: 17  14   Temp: 98.3 F (36.8 C)  97.7 F (36.5 C)   TempSrc: Oral  Oral   SpO2: 100%  100% 99%  Weight:  51.1 kg    Height:  5\' 6"  (1.676 m)     Physical Exam General:NAD, chronically ill appearing female, sitting on edge of her bed Heart:RRR, no mrg Lungs:CTAB Extremities:1+ LE edema Dialysis Access: LU AVF +b/t   Filed Weights   09/12/18 1644 09/15/18 0015 09/15/18 0357  Weight: 51.1 kg 55 kg 51.1 kg    Intake/Output Summary (Last 24 hours) at 09/15/2018 0916 Last data filed at 09/15/2018 0300 Gross per 24 hour  Intake 300 ml  Output 2142 ml  Net -1842 ml    Additional Objective Labs: Basic Metabolic Panel: Recent Labs  Lab 09/13/18 0218 09/15/18 0102 09/15/18 0543  NA 135 130* 134*  K 4.2 3.2* 3.5  CL 99 94* 93*  CO2 30 24 29   GLUCOSE 118* 241* 190*  BUN 11 52* 22  CREATININE 1.86* 3.16* 1.86*  CALCIUM 8.3* 8.4* 8.1*  PHOS  --  4.8* 3.2   Liver Function Tests: Recent Labs  Lab 09/09/18 1424 09/15/18 0102 09/15/18 0543  AST 22  --   --   ALT 11  --   --   ALKPHOS 46  --   --   BILITOT 1.1  --   --   PROT 6.5  --   --   ALBUMIN 3.3* 2.5* 2.8*   Recent Labs  Lab 09/09/18 1424 09/12/18 0332  LIPASE 2,164* 96*   CBC: Recent Labs  Lab 09/09/18 1424 09/10/18 0118  09/13/18 0218 09/15/18 0102 09/15/18 0543  WBC 20.5* 24.4*  --  12.5* 7.0  --   HGB 11.7* 10.9*   < > 9.2* 9.0* 9.8*  HCT 38.4 34.6*  --  29.1* 27.9* 31.1*  MCV 94.1 94.5  --  90.4 90.9  --   PLT 351 274  --  185 204  --    < > = values in this interval not displayed.    Cardiac Enzymes: Recent Labs  Lab 09/09/18 1918 09/10/18 0118  09/10/18 0713  TROPONINI 0.09* 0.11* 0.10*   CBG: Recent Labs  Lab 09/14/18 0810 09/14/18 1216 09/14/18 1603 09/14/18 2053 09/15/18 0832  GLUCAP 148* 231* 166* 239* 153*   Studies/Results: Ct Abdomen Pelvis W Contrast  Result Date: 09/14/2018 CLINICAL DATA:  Pancreatic mass following pancreatitis. Follow-up exam. EXAM: CT ABDOMEN AND PELVIS WITH CONTRAST TECHNIQUE: Multidetector CT imaging of the abdomen and pelvis was performed using the standard protocol following bolus administration of intravenous contrast. CONTRAST:  78mL OMNIPAQUE IOHEXOL 300 MG/ML  SOLN COMPARISON:  09/08/2018 FINDINGS: Lower chest: Mild edema identified within the lung bases. No pleural effusions. Hepatobiliary: No focal liver abnormality. Previous cholecystectomy. A small remnant of the gallbladder remains which contains a small stone measuring 3 mm.Mild fusiform increase caliber of the CBD measures up to 9 mm. No choledocholithiasis identified. Pancreas: Pancreas divisum anatomy is identified. Edema involving the pancreas and surrounding soft tissues noted. There is been interval improvement and  abnormal enlargement of the pancreatic head due to inflammation and edema. Mild increase caliber of the dorsal duct contains gas. No discrete mass identified. No pancreatic or peripancreatic fluid collections identified. Spleen: Normal in size without focal abnormality. Adrenals/Urinary Tract: The adrenal glands appear normal. Bilateral renal calcifications appear vascular in nature. No hydronephrosis identified bilaterally. Urinary bladder is unremarkable. Stomach/Bowel: Stomach normal. No abnormal small bowel dilatation identified. Postoperative changes at the cecum and distal colon noted. No abnormally dilated loops of large bowel noted. Vascular/Lymphatic: Aortic atherosclerosis. Status post stent graft repair of infrarenal abdominal aortic aneurysm. The aneurysm sac measures 4.5 cm in maximum AP dimension. Extensive aortic  branch vessel atherosclerotic calcifications noted. No adenopathy within the abdomen or pelvis. Reproductive: Status post hysterectomy. No adnexal masses. Other: Right upper quadrant fluid surrounding the descending duodenum is again noted and appears stable to slightly improved in the interval. Trace ascites noted within the posterior pelvis. Musculoskeletal: No acute or significant osseous findings. IMPRESSION: 1. Interval improvement in edema and enlargement involving the pancreatic head. No pseudo cyst identified at this time. 2. Pancreas divisum variant suspected. Gas is now seen within the mildly dilated pancreatic duct. No gas seen within the common bile duct. 3. Persistent fluid within the right upper quadrant of the abdomen centered around the descending portion of the duodenum. 4. Status post cholecystectomy. Small common bile duct remnant contains 3 mm stone. 5. Aortic atherosclerosis with abdominal aortic aneurysm status post stent graft repair. The aneurysm sac measures 4.5 cm in maximum dimension. Aortic Atherosclerosis (ICD10-I70.0). Electronically Signed   By: Kerby Moors M.D.   On: 09/14/2018 10:58    Medications: . sodium chloride    . sodium chloride     . amLODipine  5 mg Oral Daily  . atorvastatin  40 mg Oral q1800  . carvedilol  12.5 mg Oral BID WC  . Chlorhexidine Gluconate Cloth  6 each Topical Q0600  . clopidogrel  75 mg Oral Q breakfast  . darbepoetin (ARANESP) injection - DIALYSIS  60 mcg Intravenous Q Thu-HD  . feeding supplement  1 Container Oral Q24H  . feeding supplement (PRO-STAT SUGAR FREE 64)  30 mL Oral BID  . heparin  5,000 Units Subcutaneous Q8H  . hydrALAZINE  25 mg Oral Q8H  . HYDROmorphone  2 mg Oral Q4H  . insulin aspart  0-9 Units Subcutaneous TID AC & HS  . lipase/protease/amylase  24,000 Units Oral TID WC  . mometasone-formoterol  2 puff Inhalation BID  . multivitamin  1 tablet Oral QHS  . pantoprazole  40 mg Oral Daily  . 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  Per GI CA 19-9 only midly elevated, so believe CA likely not an issue.  CT today shows improvement in pancreatic enlargement/edema. Per GI/primary. 2. ESRD -On HD TTS, recent start. K 4.2. HD tomorrow at OP unit per regular schedule 3. Anemia of CKD-Hgb 9.2. Aranesp 80mcg qwk w/HD on 09/15/18. Follow trends. 4. Secondary hyperparathyroidism -Ca and phos in goal. Not on VDRA/binders. 5. HTN/volume -BP elevated. +LE edema. Under EDW, weight post HD 51kg. Will lower edw on d/c. 6. Nutrition -Last Alb 3.3. Renal diet w/fluid restrictions. Added Prostat/Boost. 7. CAD  Jen Mow, PA-C [facility] Pager: [phone] 09/15/2018,9:16 AM  LOS: 6 days   Pt seen, examined and agree w  A/P as above.  Kelly Splinter MD [employer] pager [phone]   09/15/2018, 12:53 PM

## 2018-09-22 ENCOUNTER — Encounter (INDEPENDENT_AMBULATORY_CARE_PROVIDER_SITE_OTHER): Payer: Self-pay

## 2018-09-22 ENCOUNTER — Ambulatory Visit (INDEPENDENT_AMBULATORY_CARE_PROVIDER_SITE_OTHER): Payer: Self-pay | Admitting: Gastroenterology

## 2018-09-22 ENCOUNTER — Encounter: Payer: Self-pay | Admitting: Gastroenterology

## 2018-09-22 VITALS — BP 124/60 | HR 70 | Ht 66.0 in | Wt 117.4 lb

## 2018-09-22 DIAGNOSIS — Q453 Other congenital malformations of pancreas and pancreatic duct: Secondary | ICD-10-CM

## 2018-09-22 DIAGNOSIS — R935 Abnormal findings on diagnostic imaging of other abdominal regions, including retroperitoneum: Secondary | ICD-10-CM

## 2018-09-22 DIAGNOSIS — K859 Acute pancreatitis without necrosis or infection, unspecified: Secondary | ICD-10-CM

## 2018-09-22 DIAGNOSIS — K8689 Other specified diseases of pancreas: Secondary | ICD-10-CM

## 2018-09-22 NOTE — Patient Instructions (Signed)
Normal BMI (Body Mass Index- based on height and weight) is between 19 and 25. Your BMI today is Body mass index is 18.95 kg/m. Marland Kitchen Please consider follow up  regarding your BMI with your Primary Care Provider.  Thank you for entrusting me with your care and choosing [facility].  Dr Rush Landmark

## 2018-09-22 NOTE — Progress Notes (Signed)
[place] VISIT   Primary Care Provider Bernerd Limbo, MD [place] [id number] [phone]  Patient Profile: [name] is a 68 y.o. female with a pmh significant for AAA (status post repair in May 2019), heart failure, CAD (on Plavix), atrial fibrillation (not on anticoagulation), chronic renal insufficiency (now on permanent hemodialysis) peripheral vascular disease (status post bypasses), carotid artery disease (status post CEA and left subclavian stent), hypertension, diabetes, hyperlipidemia, status post cholecystectomy, severe pancreatitis (unclear etiology though presumed to have been gallstone related and reason for gallbladder to be resected but complicated in the sense of peripancreatic/periduodenal fluid collection development and recent finding of possible pancreatic divisum).  The patient presents to the [facility] for an evaluation and management of problem(s) noted below:  Problem List 1. Acute recurrent pancreatitis   2. Pancreatic divisum   3. Abnormal CT of the abdomen   4. Peripancreatic fluid collection     History of Present Illness: Please see initial outpatient consultation note for full details of recent present illness as well as the work-up moving forward.  Interval History The patient ended up having to be admitted after my last clinic visit due to abnormalities in her renal function.  She was transitioned to hemodialysis.  She underwent a noncontrasted CT for follow-up on 3 January which have been previously planned and there was some improvement in the pancreatitis as well as the peripancreatic/periduodenal edema with no overt fluid collection that would be accessible for drainage.  This had more of a groove pancreatitis-like picture.  Unfortunately the next day she had recurrent and worsening pain that led her back to the hospital.  She was evaluated by my colleague over her  hospital stay and due to her waxing and waning discomfort we decided to proceed with a contrasted CT abdomen pelvis to get a better sense of things.  Those results are noted below which showed a mild enlargement of the head of the pancreas but no overt mass.  There was concern for pancreatic disease and some gas was noted in the pancreatic duct but nothing in the biliary duct.  She had a cystic duct stone that was still present and there was no choledocholithiasis.  She was eventually stabilized and able to be discharged home.  Unfortunately, today we found out that the patient is no longer part of our insurance network and she can no longer be seen in the outpatient of our GI clinic.  However, because of the desire of follow-up she and her daughter elected to proceed with a self-pay to be seen today.  She is feeling better since her discharge however she still remains concerned about possibility of recurrent pain and what the next steps will be for her now that she can no longer be followed here.  This is upsetting for her.  She is maintaining her weight and taking in supplements.  Remains on PPI therapy.  She has nausea but is not vomiting.  Pain is in the right upper quadrant and midepigastric region but most the time actually begins in her back and then radiates forward into her abdomen.  It is sharp and stabbing in nature.  Alleviation of this discomfort only occurs after taking pain meds.  She actually had to have a shortened hemodialysis session because of the worsening pain yesterday.  GI Review of Systems Positive as above Negative for dysphagia, odynophagia, change in bowel habits, loose/floating stools, melena, hematochezia  Review of Systems  General: Denies fevers/chills HEENT: Denies oral lesions Cardiovascular: Denies chest pain Pulmonary: Shortness of breath is stable Gastroenterological: See HPI Genitourinary: Denies darkened urine Hematological: Positive for easy bruising/bleeding due  to Plavix Dermatological: Denies jaundice Psychological: Mood is anxious and scared since she is concerned about pain recurring and not having a GI doctor now in her network that she knows of   Medications Current Outpatient Medications  Medication Sig Dispense Refill  . acetaminophen (TYLENOL) 325 MG tablet Take 325 mg by mouth every 6 (six) hours as needed (for pain).    . Albuterol Sulfate (PROAIR RESPICLICK) 209 (90 Base) MCG/ACT AEPB Inhale 1 puff into the lungs every 4 (four) hours as needed (shortness of breath/wheezing).    Marland Kitchen allopurinol (ZYLOPRIM) 100 MG tablet Take 100 mg by mouth daily.  2  . Amino Acids-Protein Hydrolys (FEEDING SUPPLEMENT, PRO-STAT SUGAR FREE 64,) LIQD Take 30 mLs by mouth 3 (three) times daily with meals. 900 mL 0  . amLODipine (NORVASC) 5 MG tablet Take 1 tablet (5 mg total) by mouth daily. 30 tablet 0  . atorvastatin (LIPITOR) 40 MG tablet Take 40 mg by mouth daily.     . B Complex-C-Folic Acid (DIALYVITE PO) Take 1 tablet by mouth daily.    . budesonide-formoterol (SYMBICORT) 160-4.5 MCG/ACT inhaler Inhale 2 puffs into the lungs 2 (two) times daily.     . calcium carbonate (TUMS EX) 750 MG chewable tablet Chew 2 tablets by mouth 3 (three) times daily as needed for heartburn.     . carvedilol (COREG) 12.5 MG tablet Take 1 tablet (12.5 mg total) by mouth 2 (two) times daily with a meal.    . clopidogrel (PLAVIX) 75 MG tablet Take 1 tablet (75 mg total) by mouth daily with breakfast. 90 tablet 3  . feeding supplement (BOOST / RESOURCE BREEZE) LIQD Take 1 Container by mouth 2 (two) times daily.    . fenofibrate 160 MG tablet Take 1 tablet (160 mg total) by mouth daily. 30 tablet 1  . gabapentin (NEURONTIN) 300 MG capsule Take 1 capsule (300 mg total) by mouth at bedtime. 30 capsule 0  . hydrALAZINE (APRESOLINE) 25 MG tablet Take 1 tablet (25 mg total) by mouth every 8 (eight) hours.    Marland Kitchen HYDROmorphone (DILAUDID) 2 MG tablet Take 2 mg by mouth every 4 (four) hours.      . lipase/protease/amylase 24000-76000 units CPEP Take 1 capsule (24,000 Units total) by mouth 3 (three) times daily with meals. 90 capsule 0  . loperamide (IMODIUM A-D) 2 MG tablet Take 2 mg by mouth 4 (four) times daily as needed for diarrhea or loose stools.    . OXYGEN Inhale into the lungs at bedtime.    . pantoprazole (PROTONIX) 40 MG tablet Take 1 tablet (40 mg total) by mouth daily. 30 tablet 0  . polyethylene glycol (MIRALAX / GLYCOLAX) packet Take 17 g by mouth daily as needed for mild constipation. 14 each 0   No current facility-administered medications for this visit.     Allergies Allergies  Allergen Reactions  . Isosorbide Other (See Comments)    Can only tolerate in low doses (unknown reaction)  . Isosorbide Nitrate Other (See Comments)    Can only tolerate in low doses (unknown reaction)  . Oxycodone-Acetaminophen Itching    Tolerates Hydrocodone.  Also tolerated oxycodone 07/13/18 admission.    . Codeine Itching  . Contrast Media [Iodinated Diagnostic Agents] Itching and Other (See Comments)    Itching of feet  .  Ioxaglate Itching and Other (See Comments)    Itching of feet  . Metrizamide Itching and Other (See Comments)    Itching of feet  . Percocet [Oxycodone-Acetaminophen] Itching and Other (See Comments)    Tolerates Hydrocodone    Histories Past Medical History:  Diagnosis Date  . AAA (abdominal aortic aneurysm) ([facility]) 5/09   3.7x3.3 by u/s 2009  . Abnormal EKG    deep TW inversions chronic  . Anemia   . CAD (coronary artery disease)    a. CABG 03/2011 LIMA to LAD, SVG to OM, SVG to PDA. b. cath 06/25/2015 DES to SVG to rPDA, patent LIMA to LAD, patent SVG to OM  . carotid stenosis 5/09   S/p L CEA ;  60-79% bilat ICA by preCABG dopplers 7/12  . CHF (congestive heart failure) ([facility])   . Chronic back pain    "all over" (06/24/2015)  . Chronic bronchitis ([place])    "get it pretty much q yr" (06/24/2015)  . CKD (chronic kidney disease)   .  Complication of anesthesia 1966   "problem w/ether"  . COPD (chronic obstructive pulmonary disease) ([place])   . Depression   . GERD (gastroesophageal reflux disease)    With hiatal hernia  . GIB (gastrointestinal bleeding) 9/11   S/p EGD with cautery at [facility]  . Heart murmur   . History of blood transfusion "a few times"   "all related to anemia" (06/24/2015)  . History of hiatal hernia   . Hyperlipidemia   . Hypertension   . Mitral regurgitation    3+ MR by intraoperative TEE;  s/p MV repair with Dr. Roxy Manns 7/12  . Myocardial infarction [facility]) 2012 "several"  . On home oxygen therapy    "2 liters at night; negative for sleep apnea"  . PAD (peripheral artery disease) ([facility])    Severe; s/p bilateral renal artery stents, moderate in-stent restenosis  . Pancreatitis 05/2018  . Paroxysmal atrial fibrillation ([facility])    coumadin;  echo 9/07: EF 60%, mild LVH;  s/p Cox Maze 7/12 with LAA clipping  . Subclavian artery stenosis, left ([facility])    stented by Dr. Trula Slade on 10/18 to help flow of her LIMA to LAD  . Type II diabetes mellitus ([place])    Past Surgical History:  Procedure Laterality Date  . A/V FISTULAGRAM Left 03/21/2018   Procedure: A/V FISTULAGRAM;  Surgeon: Serafina Mitchell, MD;  Location: [facility];  Service: Cardiovascular;  Laterality: Left;  . ABDOMINAL AORTIC ENDOVASCULAR STENT GRAFT N/A 01/17/2018   Procedure: ABDOMINAL AORTIC ENDOVASCULAR STENT GRAFT WITH CO2;  Surgeon: Serafina Mitchell, MD;  Location: [facility];  Service: Vascular;  Laterality: N/A;  . ABDOMINAL HYSTERECTOMY  1990's  . APPENDECTOMY  Aug. 11, 2016   Ruptured  . AV FISTULA PLACEMENT Left 11/03/2017   Procedure: ARTERIOVENOUS (AV) FISTULA CREATION LEFT ARM;  Surgeon: Serafina Mitchell, MD;  Location: [facility];  Service: Vascular;  Laterality: Left;  . BOWEL RESECTION  2013  . CARDIAC CATHETERIZATION N/A 06/24/2015   Procedure: Left Heart Cath and Cors/Grafts Angiography;  Surgeon: Leonie Man, MD;  Location: [facility];  Service: Cardiovascular;  Laterality: N/A;  . CARDIAC CATHETERIZATION N/A 06/25/2015   Procedure: Coronary Stent Intervention;  Surgeon: Sherren Mocha, MD;  Location: [facility];  Service: Cardiovascular;  Laterality: N/A;  . CAROTID ENDARTERECTOMY Left   . CATARACT EXTRACTION Right 03/27/2018  . CHOLECYSTECTOMY N/A 04/19/2018   Procedure: LAPAROSCOPIC CHOLECYSTECTOMY WITH INTRAOPERATIVE  CHOLANGIOGRAM;  Surgeon: Rolm Bookbinder, MD;  Location: [facility];  Service: General;  Laterality: N/A;  . CORONARY ANGIOPLASTY    . CORONARY ARTERY BYPASS GRAFT  03/05/2011   CABG X3 (LIMA to LAD, SVG to OM, SVG to PDA, EVH via left thigh  . CORONARY STENT PLACEMENT    . ESOPHAGOGASTRODUODENOSCOPY N/A 04/12/2018   Procedure: ESOPHAGOGASTRODUODENOSCOPY (EGD);  Surgeon: [name], MD;  Location: [facility];  Service: Endoscopy;  Laterality: N/A;  . ESOPHAGOGASTRODUODENOSCOPY N/A 06/16/2018   Procedure: ESOPHAGOGASTRODUODENOSCOPY (EGD);  Surgeon: Thornton Park, MD;  Location: [facility];  Service: Gastroenterology;  Laterality: N/A;  . LACERATION REPAIR Right 1990's   WRIST  . MAZE  03/05/2011   complete biatrial lesion set with clipping of LA appendage  . MITRAL VALVE REPAIR  03/05/2011   29mm Memo 3D ring annuloplasty for ischemic MR  . PERIPHERAL VASCULAR BALLOON ANGIOPLASTY Left 03/21/2018   Procedure: PERIPHERAL VASCULAR BALLOON ANGIOPLASTY;  Surgeon: Serafina Mitchell, MD;  Location: [facility];  Service: Cardiovascular;  Laterality: Left;  . PERIPHERAL VASCULAR CATHETERIZATION N/A 06/24/2015   Procedure: Aortic Arch Angiography;  Surgeon: Serafina Mitchell, MD;  Location: [facility] [facility];  Service: Cardiovascular;  Laterality: N/A;  . PERIPHERAL VASCULAR CATHETERIZATION  06/24/2015   Procedure: Peripheral Vascular Intervention;  Surgeon: Serafina Mitchell, MD;  Location: [facility] [place] [facility];  Service: Cardiovascular;;  . PERIPHERAL VASCULAR CATHETERIZATION N/A  06/24/2015   Procedure: Abdominal Aortogram;  Surgeon: Serafina Mitchell, MD;  Location: [place] [facility];  Service: Cardiovascular;  Laterality: N/A;  . RENAL ANGIOGRAM Bilateral 09/11/2013   Procedure: RENAL ANGIOGRAM;  Surgeon: Serafina Mitchell, MD;  Location: [facility] LAB;  Service: Cardiovascular;  Laterality: Bilateral;  . RIGHT FEMORAL-POPLITEAL BYPASS     Social History   Socioeconomic History  . Marital status: Widowed    Spouse name: Not on file  . Number of children: Not on file  . Years of education: Not on file  . Highest education level: Not on file  Occupational History  . Occupation: Scientist, water quality in past    Employer: DISABLED  Social Needs  . Financial resource strain: Not on file  . Food insecurity:    Worry: Not on file    Inability: Not on file  . Transportation needs:    Medical: Not on file    Non-medical: Not on file  Tobacco Use  . Smoking status: Current Every Day Smoker    Years: 50.00    Types: Cigarettes  . Smokeless tobacco: Never Used  . Tobacco comment: 3/4 pk per day  Substance and Sexual Activity  . Alcohol use: Yes    Alcohol/week: 0.0 standard drinks    Comment: occasionally  . Drug use: Yes    Types: Marijuana  . Sexual activity: Not Currently    Birth control/protection: None  Lifestyle  . Physical activity:    Days per week: Not on file    Minutes per session: Not on file  . Stress: Not on file  Relationships  . Social connections:    Talks on phone: Not on file    Gets together: Not on file    Attends religious service: Not on file    Active member of club or organization: Not on file    Attends meetings of clubs or organizations: Not on file    Relationship status: Not on file  . Intimate partner violence:    Fear of current or ex partner: Not on file  Emotionally abused: Not on file    Physically abused: Not on file    Forced sexual activity: Not on file  Other Topics Concern  . Not on file  Social History Narrative    Widowed in 2009.   Family History  Problem Relation Age of Onset  . Emphysema Mother   . Heart disease Mother        before age 56  . Hypertension Mother   . Hyperlipidemia Mother   . Heart attack Mother   . AAA (abdominal aortic aneurysm) Mother        rupture  . Heart attack Father 17  . Emphysema Father   . Heart disease Father        before age 68  . Hyperlipidemia Father   . Hypertension Father   . Peripheral vascular disease Father   . Diabetes Brother   . Heart disease Brother        before age 17  . Hyperlipidemia Brother   . Hypertension Brother   . Heart attack Brother        CABG  . Heart disease Other        Vascular disease in grandparents, uncles and dad  . Colon cancer Neg Hx   . Esophageal cancer Neg Hx   . Inflammatory bowel disease Neg Hx   . Liver disease Neg Hx   . Pancreatic cancer Neg Hx   . Rectal cancer Neg Hx   . Stomach cancer Neg Hx    I have reviewed her medical, social, and family history in detail and updated the electronic medical record as necessary.    PHYSICAL EXAMINATION  BP 124/60   Pulse 70   Ht 5\' 6"  (1.676 m)   Wt 117 lb 6.4 oz (53.3 kg)   SpO2 96%   BMI 18.95 kg/m  Wt Readings from Last 3 Encounters:  09/22/18 117 lb 6.4 oz (53.3 kg)  09/15/18 112 lb 10.5 oz (51.1 kg)  08/22/18 128 lb 4.9 oz (58.2 kg)  GEN: No acute distress, resting in her chair (this is improved from her prior visit when she was in a [employer]), accompanied by 1 of her daughters, make-up is on and she looks less ill than previous and is nontoxic  PSYCH: Cooperative, without pressured speech EYE: Conjunctivae pale-pink, sclerae anicteric ENT: MMM CV: RR without R/Gs  RESP: No wheezing appreciated GI: NABS, soft, tenderness to palpation noted in the midepigastrium upon deep palpation, nondistended, without rebound or guarding MSK/EXT: 1+ pitting edema from ankles to thighs SKIN: No jaundice NEURO:  Alert & Oriented x 3, no focal  deficits   REVIEW OF DATA  I reviewed the following data at the time of this encounter:  GI Procedures and Studies  No new GI procedures to review  Laboratory Studies  Reviewed in epic and care everywhere  Imaging Studies  September 08, 2018 CT abdomen pelvis without contrast IMPRESSION: 1. Some interval improvement of the probable groove pancreatitis and inflammation and fluid surrounding the descending duodenum. No discrete pancreatic or duodenal mass is identified. 2. Status post cholecystectomy with mild biliary dilatation. 3. Advanced atherosclerotic disease involving the aorta and iliac arteries.  September 14, 2018 CT abdomen pelvis with contrast IMPRESSION: 1. Interval improvement in edema and enlargement involving the pancreatic head. No pseudo cyst identified at this time. 2. Pancreas divisum variant suspected. Gas is now seen within the mildly dilated pancreatic duct. No gas seen within the common bile duct. 3. Persistent fluid within  the right upper quadrant of the abdomen centered around the descending portion of the duodenum. 4. Status post cholecystectomy. Small common bile duct remnant contains 3 mm stone. 5. Aortic atherosclerosis with abdominal aortic aneurysm status post stent graft repair. The aneurysm sac measures 4.5 cm in maximum dimension. Aortic Atherosclerosis (ICD10-I70.0).   ASSESSMENT  Ms. Hofferber is a 68 y.o. female with a pmh significant for AAA (status post repair in May 2019), heart failure, CAD (on Plavix), atrial fibrillation (not on anticoagulation), chronic renal insufficiency (now on permanent hemodialysis) peripheral vascular disease (status post bypasses), carotid artery disease (status post CEA and left subclavian stent), hypertension, diabetes, hyperlipidemia, status post cholecystectomy, severe pancreatitis (unclear etiology though presumed to have been gallstone related and reason for gallbladder to be resected but complicated in the sense of  peripancreatic/periduodenal fluid collection development and recent finding of possible pancreatic divisum).  The patient is seen today for evaluation and management of:  1. Acute recurrent pancreatitis   2. Pancreatic divisum   3. Abnormal CT of the abdomen   4. Peripancreatic fluid collection     The patient is hemodynamically stable.  At this point in time the most recent imaging with contrast had suggested the possibility of pancreatic divisum.  When we previously reviewed her MRI this was not as convincing.  I would like to try and touch base with radiology and see if they truly believe whether she does have pancreatic disease and as this could be a cause for her smoldering pancreatitis over the course of the last few months.  I think it would be most reasonable to consider the role of an endoscopic ultrasound in a few weeks time to evaluate the pancreatic head and ensure there is no mass or lesion in that area as well and to ensure that the enlargement that is noted is likely more a result of just swelling from her pancreatitis.  I would not pursue a ERCP for bile duct access however if she does have pancreatic divisum and I think it would be reasonable to consider the role of a minor papillotomy to attempt at decompression of her pancreatic duct and stenting.  This could be very helpful for the patient in who is continued to smolder.  He carries a high risk of post ERCP pancreatitis but it is something to consider.  In the setting of her normal liver tests I do not think a biliary cannulation is necessary as the cystic duct stone seems to be present only in the cystic duct and not in the common bile duct.  Unfortunately, the patient's insurance is changed.  As such she is actually no longer able to see [place] without significant financial burden to herself and her family.  Today she is a full self-pay.  I would like the patient to reach out to her insurance carriers and find out which of the major centers  around this [place], [place], [facility] would be able to see her for follow-up and continued management of her symptoms.  As such, thank if [facility] is part of her network and I would be happy to talk with the providers at [facility] to try and get her a clinic visit and possible ERCP with papillotomy.  If they are not and still happy to send over any records and discussed her case with the upcoming gastroenterologist that would become her primary.  I will ask radiology to help me to review her imaging as well to ensure we are not missing anything else  and see if they truly believe she has pancreatic divisum.  The patient and daughter will call us back once they know which facility would be able to see.  In the interim she will need to follow-up with her primary care doctor.  All patient questions were answered, to the best of my ability, and the patient agrees to the aforementioned plan of action with follow-up as indicated.   PLAN  I will discuss the case with radiology next week at multidisciplinary conference or with radiology offline to determine the true list of this pancreatic divisum diagnosis and review her prior imaging Consider the role of a diagnostic EUS to evaluate the head of the pancreas and ensure pancreatic duodenum prior to possible ERCP with minor papillotomy attempt Patient/family will reach out to insurance carrier to see which facility she would be able to be seen at an I will be happy to discuss case with whomever her GI provider she would be seeing in the near future Unfortunately she can only be a self-pay patient and this would be cost prohibitive for [place] to consider going down the route of any therapies or further imaging for now based on her insurance with [place] in the  GI group   No orders of the defined types were placed in this encounter.   New Prescriptions   No medications on file   Modified Medications   No medications on file    Planned Follow Up: No follow-ups on  file.   Justice Britain, MD [facility] Office # [phone]

## 2018-09-25 ENCOUNTER — Encounter: Payer: Self-pay | Admitting: Gastroenterology

## 2018-09-25 DIAGNOSIS — R935 Abnormal findings on diagnostic imaging of other abdominal regions, including retroperitoneum: Secondary | ICD-10-CM | POA: Insufficient documentation

## 2018-09-25 DIAGNOSIS — Q453 Other congenital malformations of pancreas and pancreatic duct: Secondary | ICD-10-CM | POA: Insufficient documentation

## 2018-09-27 ENCOUNTER — Encounter: Payer: Self-pay | Admitting: Gastroenterology

## 2018-09-27 NOTE — Progress Notes (Signed)
The patient's case was discussed within the multidisciplinary conference on 09/27/2018. We reviewed the patient's prior MRI CP as well as most recent CT abdomen pelvis with IV contrast.  The patient's anatomy is very suggestive of pancreatic divisum on the most recent CT scan but not as clear on the MRI/MRCP. After discussing the case with radiology they feel the pancreatic disease and is likely however they cannot feel 100% confident and think that an MRI/MRCP with secretary and no contrast could be helpful in delineating this. I will relay this information to the patient's daughter and patient so that they are aware of the concern. I have not heard from them as to whether which insurance for which group the patient would be able to be evaluated with regards to her follow-up GI care. I will relay this information to them through our advanced RN.  Justice Britain, MD [facility] Office # [phone]

## 2018-09-28 ENCOUNTER — Ambulatory Visit (HOSPITAL_COMMUNITY)
Admission: EM | Admit: 2018-09-28 | Discharge: 2018-09-28 | Disposition: A | Discharge status: Home / Self Care | Payer: Medicare Other | Attending: Family Medicine | Admitting: Family Medicine

## 2018-09-28 ENCOUNTER — Encounter (HOSPITAL_COMMUNITY): Payer: Self-pay | Admitting: Emergency Medicine

## 2018-09-28 DIAGNOSIS — N3000 Acute cystitis without hematuria: Secondary | ICD-10-CM | POA: Insufficient documentation

## 2018-09-28 DIAGNOSIS — N186 End stage renal disease: Secondary | ICD-10-CM

## 2018-09-28 DIAGNOSIS — I120 Hypertensive chronic kidney disease with stage 5 chronic kidney disease or end stage renal disease: Secondary | ICD-10-CM | POA: Diagnosis not present

## 2018-09-28 DIAGNOSIS — Z992 Dependence on renal dialysis: Secondary | ICD-10-CM

## 2018-09-28 LAB — POCT URINALYSIS DIP (DEVICE)
Glucose, UA: NEGATIVE mg/dL
Nitrite: NEGATIVE
PH: 6 (ref 5.0–8.0)
Specific Gravity, Urine: 1.025 (ref 1.005–1.030)
Urobilinogen, UA: 1 mg/dL (ref 0.0–1.0)

## 2018-09-28 MED ORDER — CIPROFLOXACIN HCL 250 MG PO TABS: 250.0000 mg | ORAL_TABLET | Freq: Every day | ORAL | 0 refills | Status: DC

## 2018-09-28 NOTE — Progress Notes (Signed)
Left message on machine to call back  

## 2018-09-28 NOTE — ED Provider Notes (Signed)
[place]    CSN: [id number] Arrival date & time: 09/28/18  [place]     History   Chief Complaint Chief Complaint  Patient presents with  . Dysuria    HPI [name] is a 68 y.o. female.   Patient is dialysis patient she has had some dysuria and frequency and feeling like she is still needs to void once she has emptying bladder.  She tells me she has had a history of UTIs but as I look back through the record going back 10 years she has had lots of cultures but none were ever positive  HPI  Past Medical History:  Diagnosis Date  . AAA (abdominal aortic aneurysm) ([place]) 5/09   3.7x3.3 by u/s 2009  . Abnormal EKG    deep TW inversions chronic  . Anemia   . CAD (coronary artery disease)    a. CABG 03/2011 LIMA to LAD, SVG to OM, SVG to PDA. b. cath 06/25/2015 DES to SVG to rPDA, patent LIMA to LAD, patent SVG to OM  . carotid stenosis 5/09   S/p L CEA ;  60-79% bilat ICA by preCABG dopplers 7/12  . CHF (congestive heart failure) ([place])   . Chronic back pain    "all over" (06/24/2015)  . Chronic bronchitis ([place])    "get it pretty much q yr" (06/24/2015)  . CKD (chronic kidney disease)   . Complication of anesthesia 1966   "problem w/ether"  . COPD (chronic obstructive pulmonary disease) ([place])   . Depression   . GERD (gastroesophageal reflux disease)    With hiatal hernia  . GIB (gastrointestinal bleeding) 9/11   S/p EGD with cautery at [facility]  . Heart murmur   . History of blood transfusion "a few times"   "all related to anemia" (06/24/2015)  . History of hiatal hernia   . Hyperlipidemia   . Hypertension   . Mitral regurgitation    3+ MR by intraoperative TEE;  s/p MV repair with Dr. Roxy Manns 7/12  . Myocardial infarction [facility]) 2012 "several"  . On home oxygen therapy    "2 liters at night; negative for sleep apnea"  . PAD (peripheral artery disease) (HCC)    Severe; s/p bilateral renal artery stents, moderate in-stent restenosis  . Pancreatitis 05/2018  .  Paroxysmal atrial fibrillation (HCC)    coumadin;  echo 9/07: EF 60%, mild LVH;  s/p Cox Maze 7/12 with LAA clipping  . Subclavian artery stenosis, left (HCC)    stented by Dr. Trula Slade on 10/18 to help flow of her LIMA to LAD  . Type II diabetes mellitus [facility]     Patient Active Problem List   Diagnosis Date Noted  . Abnormal CT of the abdomen 09/25/2018  . Pancreatic divisum 09/25/2018  . Pancreatic mass   . ESRD (end stage renal disease) ([place]) 09/09/2018  . Renal failure (ARF), acute on chronic (HCC) 08/15/2018  . ARF (acute renal failure) ([place]) 08/15/2018  . CKD (chronic kidney disease), stage V ([place]) 08/15/2018  . Hypomagnesemia 08/15/2018  . Acute pulmonary edema ([place]) 08/14/2018  . Goals of care, counseling/discussion   . DNR (do not resuscitate) discussion   . AKI (acute kidney injury) ([place]) 07/26/2018  . Acute encephalopathy 07/24/2018  . Chronic pain 07/24/2018  . Malnutrition of moderate degree 07/13/2018  . Stage II pressure ulcer ([place]) 07/12/2018  . Fluid collection at surgical site   . Intraabdominal fluid collection   . Cystic duct calculus   . RUQ  pain 07/08/2018  . Peripancreatic fluid collection 07/04/2018  . 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 Urinary retention 07/04/2018  . S/P insertion of iliac artery stent 07/04/2018  . Endoleak post (EVAR) endovascular aneurysm repair ([place]) 07/04/2018  . Physical deconditioning   . Esophageal ulcer with bleeding   . Pressure injury of skin 06/16/2018  . Hematemesis   . Palliative care patient   . Palliative care by specialist   . Anasarca 06/07/2018  . Demand ischemia ([place]) 06/07/2018  . Protein-calorie malnutrition, severe 05/29/2018  . Type II diabetes mellitus with renal manifestations ([place]) 05/28/2018  . Recurrent pancreatitis 05/28/2018  . Hypokalemia 05/28/2018  . SIRS (systemic inflammatory response syndrome) ([place]) 05/28/2018  . Acute recurrent pancreatitis 05/28/2018  . Volume overload 04/17/2018  .  Anemia due to GI blood loss 04/14/2018  . Acute gallstone pancreatitis 04/14/2018  . Acute cholecystitis 04/14/2018  . Gastrointestinal hemorrhage   . Fatigue associated with anemia 04/09/2018  . Hypoglycemia 01/19/2018  . Type II diabetes mellitus ([place]) 01/19/2018  . Nausea with vomiting 01/19/2018  . Uncontrolled hypertension 01/19/2018  . Hyperlipidemia 01/19/2018  . CAD (coronary artery disease) 01/19/2018  . COPD with hypoxia ([place]) 01/19/2018  . Chronic kidney disease (CKD), stage IV (severe) ([place]) 01/19/2018  . Retroperitoneal hematoma 01/19/2018  . Acute blood loss anemia 01/19/2018  . Chronic diastolic congestive heart failure ([place]) 01/19/2018  . AAA (abdominal aortic aneurysm) ([place]) 01/16/2018  . Elevated brain natriuretic peptide (BNP) level 10/07/2015  . Diabetes mellitus ([place]) 10/07/2015  . Pain in the chest   . PAD (peripheral artery disease) ([place]) 07/29/2015  . Chest pain 07/04/2015  . Acute renal failure superimposed on stage 4 chronic kidney disease ([place]) 07/04/2015  . GERD (gastroesophageal reflux disease) 07/04/2015  . Anemia 06/26/2015  . Coronary artery disease involving autologous vein coronary bypass graft with unstable angina pectoris ([place]) 06/24/2015  . Angina at rest [facility])   . Subclavian artery stenosis, left ([place]) 06/23/2015  . AAA (abdominal aortic aneurysm) without rupture ([place]) 12/10/2014  . Subclavian steal syndrome 12/10/2014  . Renal artery stenosis ([place]) 09/04/2013  . Carotid artery stenosis 09/28/2012  . Mitral valve disorder 06/23/2011  . S/P CABG x 3 06/16/2011  . S/P Maze operation for atrial fibrillation 06/16/2011  . S/P MVR (mitral valve repair) 06/16/2011  . Follow-up examination following surgery 03/29/2011  . CKD (chronic kidney disease) 03/03/2011  . COPD (chronic obstructive pulmonary disease) ([place]) 03/03/2011  . Chronic diastolic heart failure ([place]) 02/26/2011  . Hypotension 02/26/2011  . NSTEMI (non-ST elevated myocardial  infarction) ([place]) 02/26/2011  . Long term (current) use of anticoagulants 12/17/2010  . OBSTRUCTIVE SLEEP APNEA 09/04/2010  . HYPOXEMIA 09/04/2010  . SNORING 08/05/2010  . Atrial fibrillation ([place]) 12/09/2009  . WEIGHT LOSS 03/28/2009  . PALPITATIONS 03/28/2009  . PVD 11/21/2008  . HYPERLIPIDEMIA-MIXED 11/20/2008  . TOBACCO ABUSE 11/20/2008  . Essential hypertension 11/20/2008  . Coronary artery disease involving native heart with angina pectoris ([place]) 11/20/2008  . ABDOMINAL AORTIC ANEURYSM 11/20/2008    Past Surgical History:  Procedure Laterality Date  . A/V FISTULAGRAM Left 03/21/2018   Procedure: A/V FISTULAGRAM;  Surgeon: Serafina Mitchell, MD;  Location: [place] [facility];  Service: Cardiovascular;  Laterality: Left;  . ABDOMINAL AORTIC ENDOVASCULAR STENT GRAFT N/A 01/17/2018   Procedure: ABDOMINAL AORTIC ENDOVASCULAR STENT GRAFT WITH CO2;  Surgeon: Serafina Mitchell, MD;  Location: [place];  Service: Vascular;  Laterality: N/A;  . ABDOMINAL HYSTERECTOMY  1990's  . APPENDECTOMY  Aug. 11,  2016   Ruptured  . AV FISTULA PLACEMENT Left 11/03/2017   Procedure: ARTERIOVENOUS (AV) FISTULA CREATION LEFT ARM;  Surgeon: Serafina Mitchell, MD;  Location: [place];  Service: Vascular;  Laterality: Left;  . BOWEL RESECTION  2013  . CARDIAC CATHETERIZATION N/A 06/24/2015   Procedure: Left Heart Cath and Cors/Grafts Angiography;  Surgeon: Leonie Man, MD;  Location: [place] CV [facility];  Service: Cardiovascular;  Laterality: N/A;  . CARDIAC CATHETERIZATION N/A 06/25/2015   Procedure: Coronary Stent Intervention;  Surgeon: Sherren Mocha, MD;  Location: [place] CV [facility];  Service: Cardiovascular;  Laterality: N/A;  . CAROTID ENDARTERECTOMY Left   . CATARACT EXTRACTION Right 03/27/2018  . CHOLECYSTECTOMY N/A 04/19/2018   Procedure: LAPAROSCOPIC CHOLECYSTECTOMY WITH INTRAOPERATIVE CHOLANGIOGRAM;  Surgeon: Rolm Bookbinder, MD;  Location: [place];  Service: General;  Laterality: N/A;  . CORONARY  ANGIOPLASTY    . CORONARY ARTERY BYPASS GRAFT  03/05/2011   CABG X3 (LIMA to LAD, SVG to OM, SVG to PDA, EVH via left thigh  . CORONARY STENT PLACEMENT    . ESOPHAGOGASTRODUODENOSCOPY N/A 04/12/2018   Procedure: ESOPHAGOGASTRODUODENOSCOPY (EGD);  Surgeon: [name], MD;  Location[facility] ENDOSCOPY;  Service: Endoscopy;  Laterality: N/A;  . ESOPHAGOGASTRODUODENOSCOPY N/A 06/16/2018   Procedure: ESOPHAGOGASTRODUODENOSCOPY (EGD);  Surgeon: Thornton Park, MD;  Location: [place];  Service: Gastroenterology;  Laterality: N/A;  . LACERATION REPAIR Right 1990's   WRIST  . MAZE  03/05/2011   complete biatrial lesion set with clipping of LA appendage  . MITRAL VALVE REPAIR  03/05/2011   3mm Memo 3D ring annuloplasty for ischemic MR  . PERIPHERAL VASCULAR BALLOON ANGIOPLASTY Left 03/21/2018   Procedure: PERIPHERAL VASCULAR BALLOON ANGIOPLASTY;  Surgeon: Serafina Mitchell, MD;  Location: [place] CV LAB;  Service: Cardiovascular;  Laterality: Left;  . PERIPHERAL VASCULAR CATHETERIZATION N/A 06/24/2015   Procedure: Aortic Arch Angiography;  Surgeon: Serafina Mitchell, MD;  Location: [place] [facility];  Service: Cardiovascular;  Laterality: N/A;  . PERIPHERAL VASCULAR CATHETERIZATION  06/24/2015   Procedure: Peripheral Vascular Intervention;  Surgeon: Serafina Mitchell, MD;  Location: [place] [facility];  Service: Cardiovascular;;  . PERIPHERAL VASCULAR CATHETERIZATION N/A 06/24/2015   Procedure: Abdominal Aortogram;  Surgeon: Serafina Mitchell, MD;  Location: [place] [facility];  Service: Cardiovascular;  Laterality: N/A;  . RENAL ANGIOGRAM Bilateral 09/11/2013   Procedure: RENAL ANGIOGRAM;  Surgeon: Serafina Mitchell, MD;  Location: [facility] CATH LAB;  Service: Cardiovascular;  Laterality: Bilateral;  . RIGHT FEMORAL-POPLITEAL BYPASS      OB History   No obstetric history on file.      Home Medications    Prior to Admission medications   Medication Sig Start Date End Date Taking? Authorizing  Provider  acetaminophen (TYLENOL) 325 MG tablet Take 325 mg by mouth every 6 (six) hours as needed (for pain).    [provider]  Albuterol Sulfate (PROAIR RESPICLICK) 742 (90 Base) MCG/ACT AEPB Inhale 1 puff into the lungs every 4 (four) hours as needed (shortness of breath/wheezing).    [provider]  allopurinol (ZYLOPRIM) 100 MG tablet Take 100 mg by mouth daily. 02/02/17   [provider]  Amino Acids-Protein Hydrolys (FEEDING SUPPLEMENT, PRO-STAT SUGAR FREE 64,) LIQD Take 30 mLs by mouth 3 (three) times daily with meals. 06/26/18   Thurnell Lose, MD  amLODipine (NORVASC) 5 MG tablet Take 1 tablet (5 mg total) by mouth daily. 08/23/18   Mikhail, Velta Addison, DO  atorvastatin (LIPITOR) 40 MG tablet  Take 40 mg by mouth daily.  12/07/17   [provider]  B Complex-C-Folic Acid (DIALYVITE PO) Take 1 tablet by mouth daily.    [provider]  budesonide-formoterol (SYMBICORT) 160-4.5 MCG/ACT inhaler Inhale 2 puffs into the lungs 2 (two) times daily.     [provider]  calcium carbonate (TUMS EX) 750 MG chewable tablet Chew 2 tablets by mouth 3 (three) times daily as needed for heartburn.     [provider]  carvedilol (COREG) 12.5 MG tablet Take 1 tablet (12.5 mg total) by mouth 2 (two) times daily with a meal. 06/26/18   Thurnell Lose, MD  ciprofloxacin (CIPRO) 250 MG tablet Take 1 tablet (250 mg total) by mouth daily with breakfast. 09/28/18   Wardell Honour, MD  clopidogrel (PLAVIX) 75 MG tablet Take 1 tablet (75 mg total) by mouth daily with breakfast. 06/26/15   Almyra Deforest, PA  feeding supplement (BOOST / RESOURCE BREEZE) LIQD Take 1 Container by mouth 2 (two) times daily.    [provider]  fenofibrate 160 MG tablet Take 1 tablet (160 mg total) by mouth daily. 10/08/15   Delfina Redwood, MD  gabapentin (NEURONTIN) 300 MG capsule Take 1 capsule (300 mg total) by mouth at bedtime. 08/22/18   Mikhail, Velta Addison, DO    hydrALAZINE (APRESOLINE) 25 MG tablet Take 1 tablet (25 mg total) by mouth every 8 (eight) hours. 06/26/18   Thurnell Lose, MD  HYDROmorphone (DILAUDID) 2 MG tablet Take 2 mg by mouth every 4 (four) hours.     [provider]  lipase/protease/amylase 24000-76000 units CPEP Take 1 capsule (24,000 Units total) by mouth 3 (three) times daily with meals. 08/22/18   Mikhail, Velta Addison, DO  loperamide (IMODIUM A-D) 2 MG tablet Take 2 mg by mouth 4 (four) times daily as needed for diarrhea or loose stools.    [provider]  OXYGEN Inhale into the lungs at bedtime.    [provider]  pantoprazole (PROTONIX) 40 MG tablet Take 1 tablet (40 mg total) by mouth daily. 08/03/18   Patrecia Pour, MD  polyethylene glycol (MIRALAX / Floria Raveling) packet Take 17 g by mouth daily as needed for mild constipation. 06/26/18   Thurnell Lose, MD    Family History Family History  Problem Relation Age of Onset  . Emphysema Mother   . Heart disease Mother        before age 39  . Hypertension Mother   . Hyperlipidemia Mother   . Heart attack Mother   . AAA (abdominal aortic aneurysm) Mother        rupture  . Heart attack Father 85  . Emphysema Father   . Heart disease Father        before age 54  . Hyperlipidemia Father   . Hypertension Father   . Peripheral vascular disease Father   . Diabetes Brother   . Heart disease Brother        before age 22  . Hyperlipidemia Brother   . Hypertension Brother   . Heart attack Brother        CABG  . Heart disease Other        Vascular disease in grandparents, uncles and dad  . Colon cancer Neg Hx   . Esophageal cancer Neg Hx   . Inflammatory bowel disease Neg Hx   . Liver disease Neg Hx   . Pancreatic cancer Neg Hx   . Rectal cancer Neg Hx   .  Stomach cancer Neg Hx     Social History Social History   Tobacco Use  . Smoking status: Current Every Day Smoker    Years: 50.00    Types: Cigarettes  . Smokeless tobacco: Never  Used  . Tobacco comment: 3/4 pk per day  Substance Use Topics  . Alcohol use: Yes    Alcohol/week: 0.0 standard drinks    Comment: occasionally  . Drug use: Yes    Types: Marijuana     Allergies   Isosorbide; Isosorbide nitrate; Oxycodone-acetaminophen; Codeine; Contrast media [iodinated diagnostic agents]; Ioxaglate; Metrizamide; and Percocet [oxycodone-acetaminophen]   Review of Systems Review of Systems  Genitourinary: Positive for dysuria and frequency.  All other systems reviewed and are negative.    Physical Exam Triage Vital Signs ED Triage Vitals  Enc Vitals Group     BP 09/28/18 1917 (!) 177/50     Pulse Rate 09/28/18 1916 63     Resp 09/28/18 1916 18     Temp 09/28/18 1916 98.9 F (37.2 C)     Temp src --      SpO2 09/28/18 1916 97 %     Weight --      Height --      Head Circumference --      Peak Flow --      Pain Score 09/28/18 1916 7     Pain Loc --      Pain Edu? --      Excl. in [place]? --    No data found.  Updated Vital Signs BP (!) 177/50   Pulse 63   Temp 98.9 F (37.2 C)   Resp 18   SpO2 97%   Visual Acuity Right Eye Distance:   Left Eye Distance:   Bilateral Distance:    Right Eye Near:   Left Eye Near:    Bilateral Near:     Physical Exam Constitutional:      Appearance: Normal appearance.  Cardiovascular:     Rate and Rhythm: Normal rate and regular rhythm.  Pulmonary:     Effort: Pulmonary effort is normal.     Breath sounds: Normal breath sounds.  Abdominal:     General: Bowel sounds are normal.     Palpations: Abdomen is soft.  Neurological:     General: No focal deficit present.     Mental Status: She is alert and oriented to person, place, and time.      UC Treatments / Results  Labs (all labs ordered are listed, but only abnormal results are displayed) Labs Reviewed  POCT URINALYSIS DIP (DEVICE) - Abnormal; Notable for the following components:      Result Value   Bilirubin Urine MODERATE (*)    Ketones,  ur TRACE (*)    Hgb urine dipstick LARGE (*)    Protein, ur >=300 (*)    Leukocytes, UA LARGE (*)    All other components within normal limits  URINE CULTURE    EKG None  Radiology No results found.  Procedures Procedures (including critical care time)  Medications Ordered in UC Medications - No data to display  Initial Impression / Assessment and Plan / UC Course  I have reviewed the triage vital signs and the nursing notes.  Pertinent labs & imaging results that were available during my care of the patient were reviewed by me and considered in my medical decision making (see chart for details).     Probable urinary tract infection.  Based on symptoms and  large amount of white cells and blood in urine.  We will do urine culture and begin Cipro 250 mg daily.  She will follow-up with urine culture on Saturday Final Clinical Impressions(s) / UC Diagnoses   Final diagnoses:  Acute cystitis without hematuria   Discharge Instructions   None    ED Prescriptions    Medication Sig Dispense Auth. Provider   ciprofloxacin (CIPRO) 250 MG tablet Take 1 tablet (250 mg total) by mouth daily with breakfast. 7 tablet Wardell Honour, MD     Controlled Substance Prescriptions [place] Controlled Substance Registry consulted? No   Wardell Honour, MD 09/28/18 2018

## 2018-09-28 NOTE — ED Triage Notes (Signed)
Pt c/o uti symptoms, burning frequency, states her back hurts a little bit. Symptoms x3 days.

## 2018-09-29 NOTE — Progress Notes (Signed)
Left message on machine to call back  

## 2018-09-29 NOTE — Progress Notes (Signed)
The pt was provided with the physician names and will work with her insurance company and call back when she receives some answers.

## 2018-09-29 NOTE — Progress Notes (Signed)
I spoke with the pt and she states they are still working on Ship broker.  She says [facility] is covered as a facility but they have to verify if the MD is covered.  Do you have anyone special that you would like her to see?  If so, I can give her the name and she can pass that on to the insurance for verification.

## 2018-09-29 NOTE — Progress Notes (Signed)
Obie Dredge, Jola Schmidt, [place], Orlene Plum Garris are the providers in the Biliary clinic.

## 2018-10-01 LAB — URINE CULTURE: Culture: >=100000 — AB

## 2018-10-02 ENCOUNTER — Telehealth (HOSPITAL_COMMUNITY): Payer: Self-pay | Admitting: Emergency Medicine

## 2018-10-02 ENCOUNTER — Telehealth: Payer: Self-pay | Admitting: Gastroenterology

## 2018-10-02 NOTE — Telephone Encounter (Signed)
PT would like to know what is her next procedure or exam due to her might having to change doctor. JG

## 2018-10-02 NOTE — Telephone Encounter (Signed)
Urine culture was positive for Escherichia coli and was given cipro  at urgent care visit. Pt contacted and made aware, educated on completing antibiotic and to follow up if symptoms are persistent. Verbalized understanding. Attempted call

## 2018-10-02 NOTE — Telephone Encounter (Signed)
The pt was given the name of the procedures she will need.  She will call back if she has any further questions.

## 2018-10-02 NOTE — Telephone Encounter (Signed)
The pt will need diagnostic EUS vs ERCP Left message on machine to call back

## 2018-10-09 NOTE — Telephone Encounter (Signed)
No answer and voicemail full.

## 2018-10-09 NOTE — Telephone Encounter (Signed)
PT called back and wanted to speak with the nurse to get the name of the provider who will do the ERCP

## 2018-10-10 NOTE — Telephone Encounter (Signed)
No answer and no voice mail.  Will wait for further communication from the pt

## 2018-10-13 ENCOUNTER — Telehealth: Payer: Self-pay

## 2018-10-13 NOTE — Telephone Encounter (Signed)
I called the pt to give her the information but there was no answer and no voice mail.

## 2018-10-13 NOTE — Telephone Encounter (Signed)
In [place], the two groups that I know of are [facility] and [facility] and then there is always [place], Dr. William Hamburger is the advanced endoscopist - but I am not sure if she sees outpatients and thus would likely have to see one of the other GI docs and then get referred to her.  At [facility] is one of their Advanced Endoscopists.  At [facility] Dr. Jerene Pitch is the advanced endoscopist. I hope that helps. GM

## 2018-10-13 NOTE — Telephone Encounter (Signed)
Dr Rush Landmark the pt says you mentioned a female MD at [facility] that could do your procedure.  Do you remember who it was?  She wants to check with her insurance.

## 2018-10-16 NOTE — Telephone Encounter (Signed)
No answer no voice mail will wait for further communication from the pt.

## 2018-10-30 ENCOUNTER — Telehealth: Payer: Self-pay | Admitting: Gastroenterology

## 2018-10-30 NOTE — Telephone Encounter (Signed)
I actually spoke with the pt and she has scheduled an appt with Dr Patsey Berthold at [facility].  I will forward any records needed.     Dr Rush Landmark the pt called to state she has not yet found anyone that will cover her EUS/ERCP within her insurance network.  She said that she may be able to get a 1 time override with the insurance for the procedures but that would not cover any follow up visits or long term issues that may arise. She was given the name of the [facility] MD (Dr Patsey Berthold)  that you recommended however she has not called as of yet.  I explained that she will most likely need several appointments and for continuity of care it would be in her best interest to see someone that is covered under her plan.  Per verbal conversation with Dr Rush Landmark I will ask the pt to get a list of providers her insurance will cover and Dr Rush Landmark can take a look and recommend someone from this list.

## 2018-10-30 NOTE — Telephone Encounter (Signed)
Pt called wanted to discuss with the nurse setting up  appt for a ct scan.

## 2018-10-31 NOTE — Telephone Encounter (Signed)
I agree, although not ideal, having set up 1 time procedures is not ideal for a patient of her complexity and need for follow up care. I would be more than happy, if they can get the list of providers in their insurance network who are Gastroenterologists to see whom I may recommend, and thus that will be the most helpful moving forward. Thank you. GM

## 2018-11-07 ENCOUNTER — Other Ambulatory Visit: Payer: Self-pay

## 2018-11-07 DIAGNOSIS — I6523 Occlusion and stenosis of bilateral carotid arteries: Secondary | ICD-10-CM

## 2018-11-13 ENCOUNTER — Ambulatory Visit (INDEPENDENT_AMBULATORY_CARE_PROVIDER_SITE_OTHER)
Admission: RE | Admit: 2018-11-13 | Discharge: 2018-11-13 | Disposition: A | Discharge status: Home / Self Care | Payer: Medicare Other | Source: Ambulatory Visit | Attending: Family | Admitting: Family

## 2018-11-13 ENCOUNTER — Ambulatory Visit (HOSPITAL_COMMUNITY)
Admission: RE | Admit: 2018-11-13 | Discharge: 2018-11-13 | Disposition: A | Discharge status: Home / Self Care | Payer: Medicare Other | Source: Ambulatory Visit | Attending: Family | Admitting: Family

## 2018-11-13 ENCOUNTER — Encounter: Payer: Self-pay | Admitting: Family

## 2018-11-13 ENCOUNTER — Other Ambulatory Visit: Payer: Self-pay

## 2018-11-13 ENCOUNTER — Ambulatory Visit (INDEPENDENT_AMBULATORY_CARE_PROVIDER_SITE_OTHER): Payer: Medicare Other | Admitting: Family

## 2018-11-13 VITALS — BP 171/66 | HR 100 | Temp 98.0°F | Resp 16 | Ht 65.0 in | Wt 114.0 lb

## 2018-11-13 DIAGNOSIS — Z992 Dependence on renal dialysis: Secondary | ICD-10-CM

## 2018-11-13 DIAGNOSIS — I714 Abdominal aortic aneurysm, without rupture, unspecified: Secondary | ICD-10-CM

## 2018-11-13 DIAGNOSIS — F17200 Nicotine dependence, unspecified, uncomplicated: Secondary | ICD-10-CM

## 2018-11-13 DIAGNOSIS — N186 End stage renal disease: Secondary | ICD-10-CM | POA: Diagnosis not present

## 2018-11-13 DIAGNOSIS — Z9889 Other specified postprocedural states: Secondary | ICD-10-CM

## 2018-11-13 DIAGNOSIS — I770 Arteriovenous fistula, acquired: Secondary | ICD-10-CM

## 2018-11-13 DIAGNOSIS — I6523 Occlusion and stenosis of bilateral carotid arteries: Secondary | ICD-10-CM | POA: Diagnosis present

## 2018-11-13 DIAGNOSIS — K859 Acute pancreatitis without necrosis or infection, unspecified: Secondary | ICD-10-CM

## 2018-11-13 DIAGNOSIS — I779 Disorder of arteries and arterioles, unspecified: Secondary | ICD-10-CM

## 2018-11-13 DIAGNOSIS — Z95828 Presence of other vascular implants and grafts: Secondary | ICD-10-CM

## 2018-11-13 NOTE — Progress Notes (Addendum)
[facility] [place]  CC: Follow up s/p Endovascular Repair of Abdominal Aortic Aneurysm and extracranial carotid artery stenosis  History of Present Illness  [name] is a 68 y.o. (November 03, 1950) female whom Dr. Trula Slade has been following for both chronic renal insufficiency as well as an abdominal aortic aneurysm. We were trying to delay the treatment of her aneurysm until her renal disease progressed to dialysis. She presented to the hospital in May 2019 with worsening abdominal pain and an increase in the size of her aneurysm. Her aneurysm had increased from 4.8 cm up to 5.4 cm. On 01/17/2018 she underwent endovascular repair. 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 In order to cross this lesion Dr. Trula Slade had to snare it from the contralateral right side. A VBX stent was placed across the lesion inside the aortic graft due to residual narrowing. Patient's postoperative course was complicated by retroperitoneal hematoma likely the result from arterial injury when dilating her left common iliac artery. A total of 7 cc of contrast were utilized for her repair.The patient stayed in the hospital almost an additional week.  In 1994 she had a right fem-pop BPG by Dr. [name].   She underwent a fistulogram on 03-21-2018 and had PTA of the proximal fistula.  She is back for follow up.   Dr. Trula Slade last evaluated pt on 05-15-18. At that time: Renal: Ultrasound showed residual stenosis near the arterial venous anastomosis, however there was a good thrill within the fistula.  At that point, I would recommend using her fistula if needed.  If there are difficulties, I discussed either repeating angioplasty of the proximal fistula or considering replacing the proximal portion of the fistula. Carotid:  Follow up duplex 1 year GI: The patient was status post laparoscopic cholecystectomy for gallstone pancreatitis.  She had a few day history of right upper  quadrant pain, nausea, and anorexia. Dr. Trula Slade spoke with Dr. Donne Hazel who was going to initiate blood work and possible imaging and was to contact the patient later that day.  She started hemodialysis in January 2020 via left upper arm AVF; HD on T-TH-S. Pt states [facility] placed a stent in her AVF last week.  She had an episode of acute pancreatitis in January 2020, had a cholecystectomy in the Summer of 2019.  She states it is believed by her GI that her intermittent right flank pain is due to pancreatitis. She is in the process of scheduling an ERCP.  She states that she lost 40 pounds and has had trouble gaining weight.    Diabetic: Yes, states her A1C is 5.4 Tobaccos use: smoker  (3/4 ppd, started at age 36 yrs)   Past Medical History:  Diagnosis Date  . AAA (abdominal aortic aneurysm) ([place]) 5/09   3.7x3.3 by u/s 2009  . Abnormal EKG    deep TW inversions chronic  . Anemia   . CAD (coronary artery disease)    a. CABG 03/2011 LIMA to LAD, SVG to OM, SVG to PDA. b. cath 06/25/2015 DES to SVG to rPDA, patent LIMA to LAD, patent SVG to OM  . carotid stenosis 5/09   S/p L CEA ;  60-79% bilat ICA by preCABG dopplers 7/12  . CHF (congestive heart failure) ([place])   . Chronic back pain    "all over" (06/24/2015)  . Chronic bronchitis ([place])    "get it pretty much q yr" (06/24/2015)  . CKD (chronic kidney disease)   .  Complication of anesthesia 1966   "problem w/ether"  . COPD (chronic obstructive pulmonary disease) ([place])   . Depression   . GERD (gastroesophageal reflux disease)    With hiatal hernia  . GIB (gastrointestinal bleeding) 9/11   S/p EGD with cautery at [facility]  . Heart murmur   . History of blood transfusion "a few times"   "all related to anemia" (06/24/2015)  . History of hiatal hernia   . Hyperlipidemia   . Hypertension   . Mitral regurgitation    3+ MR by intraoperative TEE;  s/p MV repair with Dr. Roxy Manns 7/12  . Myocardial infarction [facility]) 2012 "several"  . On  home oxygen therapy    "2 liters at night; negative for sleep apnea"  . PAD (peripheral artery disease) ([facility])    Severe; s/p bilateral renal artery stents, moderate in-stent restenosis  . Pancreatitis 05/2018  . Paroxysmal atrial fibrillation ([facility])    coumadin;  echo 9/07: EF 60%, mild LVH;  s/p Cox Maze 7/12 with LAA clipping  . Subclavian artery stenosis, left ([facility])    stented by Dr. Trula Slade on 10/18 to help flow of her LIMA to LAD  . Type II diabetes mellitus ([place])    Past Surgical History:  Procedure Laterality Date  . A/V FISTULAGRAM Left 03/21/2018   Procedure: A/V FISTULAGRAM;  Surgeon: Serafina Mitchell, MD;  Location: [place] [facility];  Service: Cardiovascular;  Laterality: Left;  . ABDOMINAL AORTIC ENDOVASCULAR STENT GRAFT N/A 01/17/2018   Procedure: ABDOMINAL AORTIC ENDOVASCULAR STENT GRAFT WITH CO2;  Surgeon: Serafina Mitchell, MD;  Location: [place];  Service: Vascular;  Laterality: N/A;  . ABDOMINAL HYSTERECTOMY  1990's  . APPENDECTOMY  Aug. 11, 2016   Ruptured  . AV FISTULA PLACEMENT Left 11/03/2017   Procedure: ARTERIOVENOUS (AV) FISTULA CREATION LEFT ARM;  Surgeon: Serafina Mitchell, MD;  Location: [place];  Service: Vascular;  Laterality: Left;  . BOWEL RESECTION  2013  . CARDIAC CATHETERIZATION N/A 06/24/2015   Procedure: Left Heart Cath and Cors/Grafts Angiography;  Surgeon: Leonie Man, MD;  Location: [place] [facility];  Service: Cardiovascular;  Laterality: N/A;  . CARDIAC CATHETERIZATION N/A 06/25/2015   Procedure: Coronary Stent Intervention;  Surgeon: Sherren Mocha, MD;  Location: [place] [facility];  Service: Cardiovascular;  Laterality: N/A;  . CAROTID ENDARTERECTOMY Left   . CATARACT EXTRACTION Right 03/27/2018  . CHOLECYSTECTOMY N/A 04/19/2018   Procedure: LAPAROSCOPIC CHOLECYSTECTOMY WITH INTRAOPERATIVE CHOLANGIOGRAM;  Surgeon: Rolm Bookbinder, MD;  Location: [place];  Service: General;  Laterality: N/A;  . CORONARY ANGIOPLASTY    . CORONARY ARTERY BYPASS  GRAFT  03/05/2011   CABG X3 (LIMA to LAD, SVG to OM, SVG to PDA, EVH via left thigh  . CORONARY STENT PLACEMENT    . ESOPHAGOGASTRODUODENOSCOPY N/A 04/12/2018   Procedure: ESOPHAGOGASTRODUODENOSCOPY (EGD);  Surgeon: [name], MD;  [facility];  Service: Endoscopy;  Laterality: N/A;  . ESOPHAGOGASTRODUODENOSCOPY N/A 06/16/2018   Procedure: ESOPHAGOGASTRODUODENOSCOPY (EGD);  Surgeon: Thornton Park, MD;  Location: [place];  Service: Gastroenterology;  Laterality: N/A;  . LACERATION REPAIR Right 1990's   WRIST  . MAZE  03/05/2011   complete biatrial lesion set with clipping of LA appendage  . MITRAL VALVE REPAIR  03/05/2011   4mm Memo 3D ring annuloplasty for ischemic MR  . PERIPHERAL VASCULAR BALLOON ANGIOPLASTY Left 03/21/2018   Procedure: PERIPHERAL VASCULAR BALLOON ANGIOPLASTY;  Surgeon: Serafina Mitchell, MD;  Location: [place] [facility];  Service: Cardiovascular;  Laterality:  Left;  . PERIPHERAL VASCULAR CATHETERIZATION N/A 06/24/2015   Procedure: Aortic Arch Angiography;  Surgeon: Serafina Mitchell, MD;  Location: [place] [facility];  Service: Cardiovascular;  Laterality: N/A;  . PERIPHERAL VASCULAR CATHETERIZATION  06/24/2015   Procedure: Peripheral Vascular Intervention;  Surgeon: Serafina Mitchell, MD;  Location: [place] [facility];  Service: Cardiovascular;;  . PERIPHERAL VASCULAR CATHETERIZATION N/A 06/24/2015   Procedure: Abdominal Aortogram;  Surgeon: Serafina Mitchell, MD;  Location: [place] [facility];  Service: Cardiovascular;  Laterality: N/A;  . RENAL ANGIOGRAM Bilateral 09/11/2013   Procedure: RENAL ANGIOGRAM;  Surgeon: Serafina Mitchell, MD;  Location: [facility];  Service: Cardiovascular;  Laterality: Bilateral;  . RIGHT FEMORAL-POPLITEAL BYPASS     Social History Social History   Tobacco Use  . Smoking status: Current Every Day Smoker    Years: 50.00    Types: Cigarettes  . Smokeless tobacco: Never Used  . Tobacco comment: 3/4 pk per day  Substance  Use Topics  . Alcohol use: Yes    Alcohol/week: 0.0 standard drinks    Comment: occasionally  . Drug use: Yes    Types: Marijuana   Family History Family History  Problem Relation Age of Onset  . Emphysema Mother   . Heart disease Mother        before age 84  . Hypertension Mother   . Hyperlipidemia Mother   . Heart attack Mother   . AAA (abdominal aortic aneurysm) Mother        rupture  . Heart attack Father 24  . Emphysema Father   . Heart disease Father        before age 15  . Hyperlipidemia Father   . Hypertension Father   . Peripheral vascular disease Father   . Diabetes Brother   . Heart disease Brother        before age 77  . Hyperlipidemia Brother   . Hypertension Brother   . Heart attack Brother        CABG  . Heart disease Other        Vascular disease in grandparents, uncles and dad  . Colon cancer Neg Hx   . Esophageal cancer Neg Hx   . Inflammatory bowel disease Neg Hx   . Liver disease Neg Hx   . Pancreatic cancer Neg Hx   . Rectal cancer Neg Hx   . Stomach cancer Neg Hx    Current Outpatient Medications on File Prior to Visit  Medication Sig Dispense Refill  . acetaminophen (TYLENOL) 325 MG tablet Take 325 mg by mouth every 6 (six) hours as needed (for pain).    . Albuterol Sulfate (PROAIR RESPICLICK) 989 (90 Base) MCG/ACT AEPB Inhale 1 puff into the lungs every 4 (four) hours as needed (shortness of breath/wheezing).    Marland Kitchen allopurinol (ZYLOPRIM) 100 MG tablet Take 100 mg by mouth daily.  2  . Amino Acids-Protein Hydrolys (FEEDING SUPPLEMENT, PRO-STAT SUGAR FREE 64,) LIQD Take 30 mLs by mouth 3 (three) times daily with meals. 900 mL 0  . amLODipine (NORVASC) 5 MG tablet Take 1 tablet (5 mg total) by mouth daily. 30 tablet 0  . atorvastatin (LIPITOR) 40 MG tablet Take 40 mg by mouth daily.     . B Complex-C-Folic Acid (DIALYVITE PO) Take 1 tablet by mouth daily.    . budesonide-formoterol (SYMBICORT) 160-4.5 MCG/ACT inhaler Inhale 2 puffs into the lungs  2 (two) times daily.     . calcium carbonate (TUMS EX)  750 MG chewable tablet Chew 2 tablets by mouth 3 (three) times daily as needed for heartburn.     . carvedilol (COREG) 12.5 MG tablet Take 1 tablet (12.5 mg total) by mouth 2 (two) times daily with a meal.    . ciprofloxacin (CIPRO) 250 MG tablet Take 1 tablet (250 mg total) by mouth daily with breakfast. 7 tablet 0  . clopidogrel (PLAVIX) 75 MG tablet Take 1 tablet (75 mg total) by mouth daily with breakfast. 90 tablet 3  . feeding supplement (BOOST / RESOURCE BREEZE) LIQD Take 1 Container by mouth 2 (two) times daily.    . fenofibrate 160 MG tablet Take 1 tablet (160 mg total) by mouth daily. 30 tablet 1  . gabapentin (NEURONTIN) 300 MG capsule Take 1 capsule (300 mg total) by mouth at bedtime. 30 capsule 0  . hydrALAZINE (APRESOLINE) 25 MG tablet Take 1 tablet (25 mg total) by mouth every 8 (eight) hours.    Marland Kitchen HYDROmorphone (DILAUDID) 2 MG tablet Take 2 mg by mouth every 4 (four) hours.     . lipase/protease/amylase 24000-76000 units CPEP Take 1 capsule (24,000 Units total) by mouth 3 (three) times daily with meals. 90 capsule 0  . loperamide (IMODIUM A-D) 2 MG tablet Take 2 mg by mouth 4 (four) times daily as needed for diarrhea or loose stools.    . OXYGEN Inhale into the lungs at bedtime.    . pantoprazole (PROTONIX) 40 MG tablet Take 1 tablet (40 mg total) by mouth daily. 30 tablet 0  . polyethylene glycol (MIRALAX / GLYCOLAX) packet Take 17 g by mouth daily as needed for mild constipation. 14 each 0   No current facility-administered medications on file prior to visit.    Allergies  Allergen Reactions  . Isosorbide Other (See Comments)    Can only tolerate in low doses (unknown reaction)  . Isosorbide Nitrate Other (See Comments)    Can only tolerate in low doses (unknown reaction)  . Oxycodone-Acetaminophen Itching    Tolerates Hydrocodone.  Also tolerated oxycodone 07/13/18 admission.    . Codeine Itching  . Contrast Media  [Iodinated Diagnostic Agents] Itching and Other (See Comments)    Itching of feet  . Ioxaglate Itching and Other (See Comments)    Itching of feet  . Metrizamide Itching and Other (See Comments)    Itching of feet  . Percocet [Oxycodone-Acetaminophen] Itching and Other (See Comments)    Tolerates Hydrocodone     ROS: See HPI for pertinent positives and negatives.  Physical Examination  Vitals:   11/13/18 0917  BP: (!) 179/66  Pulse: 100  Resp: 16  Temp: 98 F (36.7 C)  TempSrc: Oral  SpO2: 97%  Weight: 113 lb 15.7 oz (51.7 kg)  Height: 5\' 5"  (1.651 m)   Body mass index is 18.97 kg/m.  General: A&O x 3, WD, female HEENT: No gross abnormalities  Pulmonary: Sym exp, respirations are non labored, limited air movement in all fields, no rales or rhonchi, + wheezes Cardiac: Regular rhythm and rate, no murmur appreciated Palpable bruit at left upper arm AVF.   Vascular: Vessel Right Left  Radial 2+Palpable 2+Palpable  Brachial Palpable Palpable  Carotid  with bruit  with bruit  Aorta Not palpable N/A  Femoral 2+Palpable 2+Palpable  Popliteal Not palpable Not palpable  PT Not Palpable Not Palpable  DP 1+Palpable Not Palpable   Gastrointestinal: soft, mild tenderness to palpation at right UQ, -G/R, - HSM, - palpable masses, - CVAT B. Musculoskeletal:  M/S 4/5 in upper extremities, 1/5 in bilateral LE's, extremities without ischemic changes. Skin: No rashes, no ulcers, no cellulitis.   Neurologic: Pain and light touch intact in extremities, Motor exam as listed above. Psychiatric: Normal thought content, mood appropriate for clinical situation.     DATA  Carotid Duplex (11-13-18): Right Carotid: Velocities in the right ICA are consistent with a 40-59%                stenosis. Calcific plaque may obscure higher velocity.  Left Carotid: Velocities in the left ICA are consistent with a 40-59% stenosis,               however sharpe curve noted.  Vertebrals:  Bilateral  vertebral arteries demonstrate antegrade flow. Subclavians: Normal flow hemodynamics were seen in bilateral subclavian              arteries. No change compared to the exam on 04-10-18.    EVAR Duplex  Current (Date: 11-13-18):  no endoleak detected Aorta     3.71            3.96               56                  +----------+----------------+-------------------+-------------------+ Right Limb1.26            0.93               90                  +----------+----------------+-------------------+-------------------+ Left Limb 0.93            0.80               77                  +----------+----------------+-------------------+-------------------+   CTA Abd/Pelvis Duplex (Date: 09-14-18): IMPRESSION: 1. Interval improvement in edema and enlargement involving the pancreatic head. No pseudo cyst identified at this time. 2. Pancreas divisum variant suspected. Gas is now seen within the mildly dilated pancreatic duct. No gas seen within the common bile duct. 3. Persistent fluid within the right upper quadrant of the abdomen centered around the descending portion of the duodenum. 4. Status post cholecystectomy. Small common bile duct remnant contains 3 mm stone. 5. Aortic atherosclerosis with abdominal aortic aneurysm status post stent graft repair. The aneurysm sac measures 4.5 cm in maximum dimension. Aortic Atherosclerosis (ICD10-I70.0).   Medical Decision Making  [name] is a 68 y.o. female who presents s/p EVAR (Date: 01-17-18).  Pt is asymptomatic with decrease in sac size to 3.96 cm today by duplex, compared to 4.5 cm by CTA on 09-14-18.   Extracranial carotid artery stenosis: stable.   Pt states her GI told her that her intermittent right flank pain is likely from pancreatitis; she is in the process of getting an ERCP scheduled.    I discussed with the patient the importance of surveillance of the endograft.  Current tobacco use: Over 3 minutes was spent  counseling patient re smoking cessation, and patient was given several free resources re smoking cessation.   Return in 6 months for EVAR duplex, left LE arterial duplex, ABI's, 1 year for carotid duplex.   I emphasized the importance of maximal medical management including strict control of blood pressure, blood glucose, and lipid levels, antiplatelet agents, obtaining regular exercise, and cessation of smoking.   Thank you for allowing [place] to participate  in this patient's care.  Clemon Chambers, RN, MSN, FNP-C [facility] [place] Office: [phone]  Clinic Physician: Oneida Alar on call  11/13/2018, 9:27 AM

## 2018-11-13 NOTE — Patient Instructions (Addendum)
Before your next abdominal ultrasound:  Avoid gas forming foods and beverages the day before the test.   Take two Extra-Strength Gas-X capsules at bedtime the night before the test. Take another two Extra-Strength Gas-X capsules in the middle of the night if you get up to the restroom, if not, first thing in the morning with water.  Do not chew gum.      Steps to Quit Smoking  Smoking tobacco can be harmful to your health and can affect almost every organ in your body. Smoking puts you, and those around you, at risk for developing many serious chronic diseases. Quitting smoking is difficult, but it is one of the best things that you can do for your health. It is never too late to quit. What are the benefits of quitting smoking? When you quit smoking, you lower your risk of developing serious diseases and conditions, such as:  Lung cancer or lung disease, such as COPD.  Heart disease.  Stroke.  Heart attack.  Infertility.  Osteoporosis and bone fractures. Additionally, symptoms such as coughing, wheezing, and shortness of breath may get better when you quit. You may also find that you get sick less often because your body is stronger at fighting off colds and infections. If you are pregnant, quitting smoking can help to reduce your chances of having a baby of low birth weight. How do I get ready to quit? When you decide to quit smoking, create a plan to make sure that you are successful. Before you quit:  Pick a date to quit. Set a date within the next two weeks to give you time to prepare.  Write down the reasons why you are quitting. Keep this list in places where you will see it often, such as on your bathroom mirror or in your car or wallet.  Identify the people, places, things, and activities that make you want to smoke (triggers) and avoid them. Make sure to take these actions: ? Throw away all cigarettes at home, at work, and in your car. ? Throw away smoking accessories,  such as Scientist, research (medical). ? Clean your car and make sure to empty the ashtray. ? Clean your home, including curtains and carpets.  Tell your family, friends, and coworkers that you are quitting. Support from your loved ones can make quitting easier.  Talk with your health care provider about your options for quitting smoking.  Find out what treatment options are covered by your health insurance. What strategies can I use to quit smoking? Talk with your healthcare provider about different strategies to quit smoking. Some strategies include:  Quitting smoking altogether instead of gradually lessening how much you smoke over a period of time. Research shows that quitting "cold [place]" is more successful than gradually quitting.  Attending in-person counseling to help you build problem-solving skills. You are more likely to have success in quitting if you attend several counseling sessions. Even short sessions of 10 minutes can be effective.  Finding resources and support systems that can help you to quit smoking and remain smoke-free after you quit. These resources are most helpful when you use them often. They can include: ? Online chats with a Social worker. ? Telephone quitlines. ? Careers information officer. ? Support groups or group counseling. ? Text messaging programs. ? Mobile phone applications.  Taking medicines to help you quit smoking. (If you are pregnant or breastfeeding, talk with your health care provider first.) Some medicines contain nicotine and some do not. Both  types of medicines help with cravings, but the medicines that include nicotine help to relieve withdrawal symptoms. Your health care provider may recommend: ? Nicotine patches, gum, or lozenges. ? Nicotine inhalers or sprays. ? Non-nicotine medicine that is taken by mouth. Talk with your health care provider about combining strategies, such as taking medicines while you are also receiving in-person counseling.  Using these two strategies together makes you more likely to succeed in quitting than if you used either strategy on its own. If you are pregnant or breastfeeding, talk with your health care provider about finding counseling or other support strategies to quit smoking. Do not take medicine to help you quit smoking unless told to do so by your health care provider. What things can I do to make it easier to quit? Quitting smoking might feel overwhelming at first, but there is a lot that you can do to make it easier. Take these important actions:  Reach out to your family and friends and ask that they support and encourage you during this time. Call telephone quitlines, reach out to support groups, or work with a counselor for support.  Ask people who smoke to avoid smoking around you.  Avoid places that trigger you to smoke, such as bars, parties, or smoke-break areas at work.  Spend time around people who do not smoke.  Lessen stress in your life, because stress can be a smoking trigger for some people. To lessen stress, try: ? Exercising regularly. ? Deep-breathing exercises. ? Yoga. ? Meditating. ? Performing a body scan. This involves closing your eyes, scanning your body from head to toe, and noticing which parts of your body are particularly tense. Purposefully relax the muscles in those areas.  Download or purchase mobile phone or tablet apps (applications) that can help you stick to your quit plan by providing reminders, tips, and encouragement. There are many free apps, such as QuitGuide from the [employer] Office manager for Disease Control and Prevention). You can find other support for quitting smoking (smoking cessation) through smokefree.gov and other websites. How will I feel when I quit smoking? Within the first 24 hours of quitting smoking, you may start to feel some withdrawal symptoms. These symptoms are usually most noticeable 2-3 days after quitting, but they usually do not last beyond  2-3 weeks. Changes or symptoms that you might experience include:  Mood swings.  Restlessness, anxiety, or irritation.  Difficulty concentrating.  Dizziness.  Strong cravings for sugary foods in addition to nicotine.  Mild weight gain.  Constipation.  Nausea.  Coughing or a sore throat.  Changes in how your medicines work in your body.  A depressed mood.  Difficulty sleeping (insomnia). After the first 2-3 weeks of quitting, you may start to notice more positive results, such as:  Improved sense of smell and taste.  Decreased coughing and sore throat.  Slower heart rate.  Lower blood pressure.  Clearer skin.  The ability to breathe more easily.  Fewer sick days. Quitting smoking is very challenging for most people. Do not get discouraged if you are not successful the first time. Some people need to make many attempts to quit before they achieve long-term success. Do your best to stick to your quit plan, and talk with your health care provider if you have any questions or concerns. This information is not intended to replace advice given to you by your health care provider. Make sure you discuss any questions you have with your health care provider. Document Released: 08/17/2001  Document Revised: 03/29/2017 Document Reviewed: 01/07/2015 Elsevier Interactive Patient Education  2019 [employer].     Peripheral Vascular Disease  Peripheral vascular disease (PVD) is a disease of the blood vessels that are not part of your heart and brain. A simple term for PVD is poor circulation. In most cases, PVD narrows the blood vessels that carry blood from your heart to the rest of your body. This can reduce the supply of blood to your arms, legs, and internal organs, like your stomach or kidneys. However, PVD most often affects a person's lower legs and feet. Without treatment, PVD tends to get worse. PVD can also lead to acute ischemic limb. This is when an arm or leg suddenly  cannot get enough blood. This is a medical emergency. Follow these instructions at home: Lifestyle  Do not use any products that contain nicotine or tobacco, such as cigarettes and e-cigarettes. If you need help quitting, ask your doctor.  Lose weight if you are overweight. Or, stay at a healthy weight as told by your doctor.  Eat a diet that is low in fat and cholesterol. If you need help, ask your doctor.  Exercise regularly. Ask your doctor for activities that are right for you. General instructions  Take over-the-counter and prescription medicines only as told by your doctor.  Take good care of your feet: ? Wear comfortable shoes that fit well. ? Check your feet often for any cuts or sores.  Keep all follow-up visits as told by your doctor This is important. Contact a doctor if:  You have cramps in your legs when you walk.  You have leg pain when you are at rest.  You have coldness in a leg or foot.  Your skin changes.  You are unable to get or have an erection (erectile dysfunction).  You have cuts or sores on your feet that do not heal. Get help right away if:  Your arm or leg turns cold, numb, and blue.  Your arms or legs become red, warm, swollen, painful, or numb.  You have chest pain.  You have trouble breathing.  You suddenly have weakness in your face, arm, or leg.  You become very confused or you cannot speak.  You suddenly have a very bad headache.  You suddenly cannot see. Summary  Peripheral vascular disease (PVD) is a disease of the blood vessels.  A simple term for PVD is poor circulation. Without treatment, PVD tends to get worse.  Treatment may include exercise, low fat and low cholesterol diet, and quitting smoking. This information is not intended to replace advice given to you by your health care provider. Make sure you discuss any questions you have with your health care provider. Document Released: 11/17/2009 Document Revised:  09/30/2016 Document Reviewed: 09/30/2016 Elsevier Interactive Patient Education  2019 [employer].

## 2019-05-01 MED ORDER — ALBUMIN HUMAN 25 % IV SOLN
12.50 | INTRAVENOUS | Status: DC
Start: ? — End: 2019-05-01

## 2019-05-01 MED ORDER — AMLODIPINE BESYLATE 10 MG PO TABS
10.00 | ORAL_TABLET | ORAL | Status: DC
Start: 2019-05-01 — End: 2019-05-01

## 2019-05-01 MED ORDER — Medication
Status: DC
Start: ? — End: 2019-05-01

## 2019-05-01 MED ORDER — VICON FORTE PO CAPS
17.00 | ORAL_CAPSULE | ORAL | Status: DC
Start: ? — End: 2019-05-01

## 2019-05-01 MED ORDER — EPOETIN ALFA-EPBX 3000 UNIT/ML IJ SOLN
50.00 | INTRAMUSCULAR | Status: DC
Start: ? — End: 2019-05-01

## 2019-05-01 MED ORDER — GENTAK 0.3 % OP SOLN
1.00 | OPHTHALMIC | Status: DC
Start: 2019-05-01 — End: 2019-05-01

## 2019-05-01 MED ORDER — ENOXAPARIN SODIUM 30 MG/0.3ML ~~LOC~~ SOLN
30.00 | SUBCUTANEOUS | Status: DC
Start: 2019-05-01 — End: 2019-05-01

## 2019-05-01 MED ORDER — HYDRALAZINE HCL 25 MG PO TABS
25.00 | ORAL_TABLET | ORAL | Status: DC
Start: 2019-04-30 — End: 2019-05-01

## 2019-05-01 MED ORDER — ALBUTEROL SULFATE (2.5 MG/3ML) 0.083% IN NEBU
2.50 | INHALATION_SOLUTION | RESPIRATORY_TRACT | Status: DC
Start: ? — End: 2019-05-01

## 2019-05-01 MED ORDER — BAYER WOMENS 81-300 MG PO TABS
40.00 | ORAL_TABLET | ORAL | Status: DC
Start: 2019-05-01 — End: 2019-05-01

## 2019-05-01 MED ORDER — ATORVASTATIN CALCIUM 40 MG PO TABS
40.00 | ORAL_TABLET | ORAL | Status: DC
Start: 2019-05-01 — End: 2019-05-01

## 2019-05-01 MED ORDER — LIDOCAINE HCL (PF) 1 % IJ SOLN
0.10 | INTRAMUSCULAR | Status: DC
Start: ? — End: 2019-05-01

## 2019-05-01 MED ORDER — HYDRALAZINE HCL 20 MG/ML IJ SOLN
10.00 | INTRAMUSCULAR | Status: DC
Start: ? — End: 2019-05-01

## 2019-05-01 MED ORDER — GABAPENTIN 300 MG PO CAPS
300.00 | ORAL_CAPSULE | ORAL | Status: DC
Start: 2019-04-30 — End: 2019-05-01

## 2019-05-01 MED ORDER — BARO-CAT PO
150.00 | ORAL | Status: DC
Start: ? — End: 2019-05-01

## 2019-05-01 MED ORDER — PANCRELIPASE (LIP-PROT-AMYL) 36000-114000 UNITS PO CPEP
72000.00 | ORAL_CAPSULE | ORAL | Status: DC
Start: 2019-04-30 — End: 2019-05-01

## 2019-05-01 MED ORDER — ONDANSETRON HCL 4 MG/2ML IJ SOLN
4.00 | INTRAMUSCULAR | Status: DC
Start: ? — End: 2019-05-01

## 2019-05-01 MED ORDER — ZOTEX GPX PO
12.50 | ORAL | Status: DC
Start: ? — End: 2019-05-01

## 2019-05-01 MED ORDER — R-TANNIC-S A/D 30-5 MG/5ML PO SUSP
100.00 | ORAL | Status: DC
Start: 2019-04-30 — End: 2019-05-01

## 2019-05-01 MED ORDER — MANNITOL 25 % IV SOLN
12.50 | INTRAVENOUS | Status: DC
Start: ? — End: 2019-05-01

## 2019-05-01 MED ORDER — GENERIC EXTERNAL MEDICATION
Status: DC
Start: ? — End: 2019-05-01

## 2019-05-01 MED ORDER — SODIUM CHLORIDE (PF) 0.9 % IJ SOLN
50.00 | INTRAMUSCULAR | Status: DC
Start: ? — End: 2019-05-01

## 2019-05-01 MED ORDER — CARVEDILOL 25 MG PO TABS
25.00 | ORAL_TABLET | ORAL | Status: DC
Start: 2019-04-30 — End: 2019-05-01

## 2019-05-01 MED ORDER — Medication
12.50 | Status: DC
Start: ? — End: 2019-05-01

## 2019-05-01 MED ORDER — SB BRONCHIAL 12.5-200 MG PO TABS
12.50 | ORAL_TABLET | ORAL | Status: DC
Start: ? — End: 2019-05-01

## 2019-05-01 MED ORDER — SUBDUE PLUS PO LIQD
75.00 | ORAL | Status: DC
Start: 2019-05-01 — End: 2019-05-01

## 2019-05-12 ENCOUNTER — Inpatient Hospital Stay (HOSPITAL_COMMUNITY)
Admission: EM | Admit: 2019-05-12 | Discharge: 2019-05-18 | DRG: 438 | Disposition: A | Discharge status: Home / Self Care | Payer: Medicare Other | Attending: Internal Medicine | Admitting: Internal Medicine

## 2019-05-12 ENCOUNTER — Other Ambulatory Visit: Payer: Self-pay

## 2019-05-12 ENCOUNTER — Other Ambulatory Visit (HOSPITAL_COMMUNITY): Payer: Self-pay

## 2019-05-12 ENCOUNTER — Emergency Department (HOSPITAL_COMMUNITY): Payer: Medicare Other

## 2019-05-12 DIAGNOSIS — E861 Hypovolemia: Secondary | ICD-10-CM | POA: Diagnosis present

## 2019-05-12 DIAGNOSIS — Z79899 Other long term (current) drug therapy: Secondary | ICD-10-CM

## 2019-05-12 DIAGNOSIS — Z9981 Dependence on supplemental oxygen: Secondary | ICD-10-CM

## 2019-05-12 DIAGNOSIS — Z951 Presence of aortocoronary bypass graft: Secondary | ICD-10-CM

## 2019-05-12 DIAGNOSIS — E876 Hypokalemia: Secondary | ICD-10-CM | POA: Diagnosis present

## 2019-05-12 DIAGNOSIS — E1151 Type 2 diabetes mellitus with diabetic peripheral angiopathy without gangrene: Secondary | ICD-10-CM | POA: Diagnosis present

## 2019-05-12 DIAGNOSIS — J449 Chronic obstructive pulmonary disease, unspecified: Secondary | ICD-10-CM | POA: Diagnosis present

## 2019-05-12 DIAGNOSIS — I132 Hypertensive heart and chronic kidney disease with heart failure and with stage 5 chronic kidney disease, or end stage renal disease: Secondary | ICD-10-CM | POA: Diagnosis present

## 2019-05-12 DIAGNOSIS — D649 Anemia, unspecified: Secondary | ICD-10-CM | POA: Diagnosis present

## 2019-05-12 DIAGNOSIS — Z955 Presence of coronary angioplasty implant and graft: Secondary | ICD-10-CM

## 2019-05-12 DIAGNOSIS — R634 Abnormal weight loss: Secondary | ICD-10-CM | POA: Diagnosis present

## 2019-05-12 DIAGNOSIS — I739 Peripheral vascular disease, unspecified: Secondary | ICD-10-CM | POA: Diagnosis present

## 2019-05-12 DIAGNOSIS — F17210 Nicotine dependence, cigarettes, uncomplicated: Secondary | ICD-10-CM | POA: Diagnosis present

## 2019-05-12 DIAGNOSIS — K219 Gastro-esophageal reflux disease without esophagitis: Secondary | ICD-10-CM | POA: Diagnosis present

## 2019-05-12 DIAGNOSIS — K851 Biliary acute pancreatitis without necrosis or infection: Secondary | ICD-10-CM | POA: Diagnosis present

## 2019-05-12 DIAGNOSIS — K861 Other chronic pancreatitis: Secondary | ICD-10-CM | POA: Diagnosis present

## 2019-05-12 DIAGNOSIS — E785 Hyperlipidemia, unspecified: Secondary | ICD-10-CM | POA: Diagnosis present

## 2019-05-12 DIAGNOSIS — Z952 Presence of prosthetic heart valve: Secondary | ICD-10-CM

## 2019-05-12 DIAGNOSIS — F329 Major depressive disorder, single episode, unspecified: Secondary | ICD-10-CM | POA: Diagnosis present

## 2019-05-12 DIAGNOSIS — E1122 Type 2 diabetes mellitus with diabetic chronic kidney disease: Secondary | ICD-10-CM | POA: Diagnosis present

## 2019-05-12 DIAGNOSIS — J4489 Other specified chronic obstructive pulmonary disease: Secondary | ICD-10-CM | POA: Diagnosis present

## 2019-05-12 DIAGNOSIS — I480 Paroxysmal atrial fibrillation: Secondary | ICD-10-CM | POA: Diagnosis present

## 2019-05-12 DIAGNOSIS — M549 Dorsalgia, unspecified: Secondary | ICD-10-CM | POA: Diagnosis present

## 2019-05-12 DIAGNOSIS — I10 Essential (primary) hypertension: Secondary | ICD-10-CM | POA: Diagnosis present

## 2019-05-12 DIAGNOSIS — I252 Old myocardial infarction: Secondary | ICD-10-CM

## 2019-05-12 DIAGNOSIS — I5032 Chronic diastolic (congestive) heart failure: Secondary | ICD-10-CM | POA: Diagnosis present

## 2019-05-12 DIAGNOSIS — Z7902 Long term (current) use of antithrombotics/antiplatelets: Secondary | ICD-10-CM

## 2019-05-12 DIAGNOSIS — I2510 Atherosclerotic heart disease of native coronary artery without angina pectoris: Secondary | ICD-10-CM | POA: Diagnosis present

## 2019-05-12 DIAGNOSIS — Z992 Dependence on renal dialysis: Secondary | ICD-10-CM

## 2019-05-12 DIAGNOSIS — Z681 Body mass index (BMI) 19 or less, adult: Secondary | ICD-10-CM

## 2019-05-12 DIAGNOSIS — G8929 Other chronic pain: Secondary | ICD-10-CM | POA: Diagnosis present

## 2019-05-12 DIAGNOSIS — K859 Acute pancreatitis without necrosis or infection, unspecified: Secondary | ICD-10-CM | POA: Diagnosis not present

## 2019-05-12 DIAGNOSIS — Z20828 Contact with and (suspected) exposure to other viral communicable diseases: Secondary | ICD-10-CM | POA: Diagnosis present

## 2019-05-12 DIAGNOSIS — Z90710 Acquired absence of both cervix and uterus: Secondary | ICD-10-CM

## 2019-05-12 DIAGNOSIS — N186 End stage renal disease: Secondary | ICD-10-CM | POA: Diagnosis present

## 2019-05-12 DIAGNOSIS — Z9049 Acquired absence of other specified parts of digestive tract: Secondary | ICD-10-CM

## 2019-05-12 LAB — COMPREHENSIVE METABOLIC PANEL
ALT: 12 U/L (ref 0–44)
AST: 19 U/L (ref 15–41)
Albumin: 3.1 g/dL — ABNORMAL LOW (ref 3.5–5.0)
Alkaline Phosphatase: 88 U/L (ref 38–126)
Anion gap: 12 (ref 5–15)
BUN: 7 mg/dL — ABNORMAL LOW (ref 8–23)
CO2: 21 mmol/L — ABNORMAL LOW (ref 22–32)
Calcium: 8.8 mg/dL — ABNORMAL LOW (ref 8.9–10.3)
Chloride: 103 mmol/L (ref 98–111)
Creatinine, Ser: 1.83 mg/dL — ABNORMAL HIGH (ref 0.44–1.00)
GFR calc Af Amer: 33 mL/min — ABNORMAL LOW (ref >60)
GFR calc non Af Amer: 28 mL/min — ABNORMAL LOW (ref >60)
Glucose, Bld: 114 mg/dL — ABNORMAL HIGH (ref 70–99)
Potassium: 4.4 mmol/L (ref 3.5–5.1)
Sodium: 136 mmol/L (ref 135–145)
Total Bilirubin: 0.7 mg/dL (ref 0.3–1.2)
Total Protein: 6.3 g/dL — ABNORMAL LOW (ref 6.5–8.1)

## 2019-05-12 LAB — CBC
HCT: 34.9 % — ABNORMAL LOW (ref 36.0–46.0)
Hemoglobin: 11 g/dL — ABNORMAL LOW (ref 12.0–15.0)
MCH: 33.7 pg (ref 26.0–34.0)
MCHC: 31.5 g/dL (ref 30.0–36.0)
MCV: 107.1 fL — ABNORMAL HIGH (ref 80.0–100.0)
Platelets: 318 10*3/uL (ref 150–400)
RBC: 3.26 MIL/uL — ABNORMAL LOW (ref 3.87–5.11)
RDW: 14.2 % (ref 11.5–15.5)
WBC: 12.6 10*3/uL — ABNORMAL HIGH (ref 4.0–10.5)
nRBC: 0 % (ref 0.0–0.2)

## 2019-05-12 LAB — LIPASE, BLOOD: Lipase: 91 U/L — ABNORMAL HIGH (ref 11–51)

## 2019-05-12 LAB — TROPONIN I (HIGH SENSITIVITY): Troponin I (High Sensitivity): 27 ng/L — ABNORMAL HIGH (ref <18)

## 2019-05-12 MED ORDER — ONDANSETRON HCL 4 MG PO TABS: 4.0000 mg | ORAL_TABLET | Freq: Four times a day (QID) | ORAL | Status: DC | PRN

## 2019-05-12 MED ORDER — SODIUM CHLORIDE 0.9 % IV BOLUS
1000.0000 mL | Freq: Once | INTRAVENOUS | Status: AC
  Administered 2019-05-12: 22:00:00 1000 mL via INTRAVENOUS

## 2019-05-12 MED ORDER — SODIUM CHLORIDE 0.9 % IV BOLUS
500.0000 mL | Freq: Once | INTRAVENOUS | Status: AC
  Administered 2019-05-12: 500 mL via INTRAVENOUS

## 2019-05-12 MED ORDER — HYDROMORPHONE HCL 2 MG/ML IJ SOLN
2.0000 mg | INTRAMUSCULAR | Status: DC | PRN
  Administered 2019-05-13 (×5): 2 mg via INTRAVENOUS
  Filled 2019-05-12 (×5): qty 1

## 2019-05-12 MED ORDER — CARVEDILOL 25 MG PO TABS
25.0000 mg | ORAL_TABLET | Freq: Two times a day (BID) | ORAL | Status: DC
  Administered 2019-05-13 – 2019-05-18 (×12): 25 mg via ORAL
  Filled 2019-05-12 (×14): qty 1

## 2019-05-12 MED ORDER — TRAZODONE HCL 100 MG PO TABS
100.0000 mg | ORAL_TABLET | Freq: Every day | ORAL | Status: DC
  Administered 2019-05-13 – 2019-05-17 (×5): 100 mg via ORAL
  Filled 2019-05-12 (×5): qty 1

## 2019-05-12 MED ORDER — DIPHENHYDRAMINE HCL 25 MG PO CAPS
50.0000 mg | ORAL_CAPSULE | Freq: Every day | ORAL | Status: DC
  Administered 2019-05-13 – 2019-05-17 (×6): 50 mg via ORAL
  Filled 2019-05-12 (×6): qty 2

## 2019-05-12 MED ORDER — ACETAMINOPHEN 650 MG RE SUPP: 650.0000 mg | Freq: Four times a day (QID) | RECTAL | Status: DC | PRN

## 2019-05-12 MED ORDER — CLOPIDOGREL BISULFATE 75 MG PO TABS
75.0000 mg | ORAL_TABLET | Freq: Every day | ORAL | Status: DC
  Administered 2019-05-15 – 2019-05-18 (×4): 75 mg via ORAL
  Filled 2019-05-12 (×5): qty 1

## 2019-05-12 MED ORDER — HYDRALAZINE HCL 25 MG PO TABS
25.0000 mg | ORAL_TABLET | Freq: Three times a day (TID) | ORAL | Status: DC
  Administered 2019-05-13 – 2019-05-18 (×14): 25 mg via ORAL
  Filled 2019-05-12 (×17): qty 1

## 2019-05-12 MED ORDER — PROMETHAZINE HCL 25 MG/ML IJ SOLN
12.5000 mg | Freq: Once | INTRAMUSCULAR | Status: AC
  Administered 2019-05-12: 12.5 mg via INTRAVENOUS
  Filled 2019-05-12: qty 1

## 2019-05-12 MED ORDER — CALCIUM CARBONATE ANTACID 500 MG PO CHEW
1.0000 | CHEWABLE_TABLET | Freq: Every day | ORAL | Status: DC
  Administered 2019-05-13 – 2019-05-18 (×6): 200 mg via ORAL
  Filled 2019-05-12 (×6): qty 1

## 2019-05-12 MED ORDER — HYDROMORPHONE HCL 2 MG/ML IJ SOLN
2.0000 mg | Freq: Once | INTRAMUSCULAR | Status: AC
  Administered 2019-05-12: 22:00:00 2 mg via INTRAVENOUS
  Filled 2019-05-12: qty 1

## 2019-05-12 MED ORDER — HYDROMORPHONE HCL 1 MG/ML IJ SOLN
1.0000 mg | Freq: Once | INTRAMUSCULAR | Status: AC
  Administered 2019-05-12: 1 mg via INTRAVENOUS
  Filled 2019-05-12: qty 1

## 2019-05-12 MED ORDER — ATORVASTATIN CALCIUM 40 MG PO TABS
40.0000 mg | ORAL_TABLET | Freq: Every day | ORAL | Status: DC
  Administered 2019-05-13 – 2019-05-17 (×5): 40 mg via ORAL
  Filled 2019-05-12 (×7): qty 1

## 2019-05-12 MED ORDER — KCL IN DEXTROSE-NACL 10-5-0.45 MEQ/L-%-% IV SOLN: INTRAVENOUS | Status: DC

## 2019-05-12 MED ORDER — DEXTROSE-NACL 5-0.45 % IV SOLN
INTRAVENOUS | Status: AC
  Administered 2019-05-13 (×2): via INTRAVENOUS

## 2019-05-12 MED ORDER — ALBUTEROL SULFATE (2.5 MG/3ML) 0.083% IN NEBU: 2.5000 mg | INHALATION_SOLUTION | RESPIRATORY_TRACT | Status: DC | PRN

## 2019-05-12 MED ORDER — HEPARIN SODIUM (PORCINE) 5000 UNIT/ML IJ SOLN
5000.0000 [IU] | Freq: Three times a day (TID) | INTRAMUSCULAR | Status: DC
  Administered 2019-05-13 – 2019-05-18 (×12): 5000 [IU] via SUBCUTANEOUS
  Filled 2019-05-12 (×12): qty 1

## 2019-05-12 MED ORDER — PANTOPRAZOLE SODIUM 40 MG IV SOLR
40.0000 mg | Freq: Every day | INTRAVENOUS | Status: DC
  Administered 2019-05-13 – 2019-05-17 (×6): 40 mg via INTRAVENOUS
  Filled 2019-05-12 (×6): qty 40

## 2019-05-12 MED ORDER — ACETAMINOPHEN 325 MG PO TABS
650.0000 mg | ORAL_TABLET | Freq: Four times a day (QID) | ORAL | Status: DC | PRN
  Administered 2019-05-14 (×2): 650 mg via ORAL
  Filled 2019-05-12 (×2): qty 2

## 2019-05-12 MED ORDER — ONDANSETRON HCL 4 MG/2ML IJ SOLN
4.0000 mg | Freq: Once | INTRAMUSCULAR | Status: AC
  Administered 2019-05-12: 4 mg via INTRAVENOUS
  Filled 2019-05-12: qty 2

## 2019-05-12 MED ORDER — SODIUM CHLORIDE 0.9% FLUSH
3.0000 mL | Freq: Once | INTRAVENOUS | Status: AC
  Administered 2019-05-12: 3 mL via INTRAVENOUS

## 2019-05-12 MED ORDER — AMLODIPINE BESYLATE 5 MG PO TABS
5.0000 mg | ORAL_TABLET | Freq: Every day | ORAL | Status: DC
  Administered 2019-05-13 – 2019-05-18 (×6): 5 mg via ORAL
  Filled 2019-05-12 (×6): qty 1

## 2019-05-12 MED ORDER — GABAPENTIN 300 MG PO CAPS
300.0000 mg | ORAL_CAPSULE | Freq: Every day | ORAL | Status: DC
  Administered 2019-05-13 – 2019-05-17 (×7): 300 mg via ORAL
  Filled 2019-05-12 (×6): qty 1

## 2019-05-12 MED ORDER — ONDANSETRON HCL 4 MG/2ML IJ SOLN
4.0000 mg | Freq: Four times a day (QID) | INTRAMUSCULAR | Status: DC | PRN
  Administered 2019-05-13 – 2019-05-14 (×5): 4 mg via INTRAVENOUS
  Filled 2019-05-12 (×5): qty 2

## 2019-05-12 NOTE — H&P (Signed)
History and Physical    [name] OFB:[id number] DOB: [birth year] DOA: 05/12/2019  PCP: Bernerd Limbo, MD  Patient coming from: Home  I have personally briefly reviewed patient's old medical records in [place]  Chief Complaint: Abdominal pain  HPI: [name] is a 68 y.o. female with medical history significant for CAD s/p CABG and DES, s/p mitral valve repair, ESRD on TTS HD, atrial fibrillation s/p Maze procedure 2012 not on anticoagulation, chronic diastolic CHF (EF 78-24%, M3NT by TTE 05/2018), PAD s/p b/l renal artery stents and left subclavian stent, AAA s/p endovascular repair 2019, chronic pancreatitis, COPD on 2 L supplemental O2 via [place] at night, HTN, HLD, RBBB, and depression who presents to the ED for epigastric pain associated with nausea and vomiting.  Patient has noted complicated pancreatic history since May 2019 with recurrent issues of pancreatitis.  Initial episode was felt due to gallstones and she underwent cholecystectomy but has had recurrent episodes since then.  She has had extensive prior work-up including EGD and EUS which was reportedly consistent with chronic pancreatitis with calcifications.  She had unremarkable ANA and IgG4 levels.  She had MRCP MRI which was unremarkable.  She was recently admitted at [facility] from 04/27/2019-04/30/2019 for another episode of acute on chronic pancreatitis.  She was treated with IVF, pain medications and bowel rest.  She has been on chronic pain management with oral Dilaudid.  She has also been taking Creon however feels that since the dose was increased she has had been having more frequent attacks.  Patient states she has been doing fairly well since discharge from [facility].  On the night of 05/11/2019 she had sudden onset epigastric and right upper quadrant pain which woke her from sleep.  She had adiation of the pain to her back.  She has had associated nausea with vomiting and inability  to maintain adequate oral intake or her home medications.  She reports diaphoresis without subjective fevers or chills.  She reports good urine output without dysuria.  She denies any diarrhea or constipation.  She denies any chest pain or dyspnea.  She was able to attend and complete her full dialysis session today 05/12/2019 however due to continued symptoms she presented to the ED for further evaluation and management.  ED Course:  Initial vitals showed BP 178/73, pulse 78, RR 13, temp 99.0 Fahrenheit, SPO2 99% on room air.  Labs notable for WBC 12.6, hemoglobin 11.0, MCV 107.1, platelets 318,000, potassium 4.4, BUN 7, creatinine 1.83, serum glucose 114, LFTs within normal limits, lipase 91, high-sensitivity troponin I 27.  SARS-CoV-2 screen test was obtained and pending.  CT abdomen/pelvis without contrast showed changes suggestive of pancreatitis involving the pancreatic head and surrounding inflammatory changes involving the pancreatic duodenal junction.  No definite loculated fluid collection was noted.  Patient was given IV Dilaudid and antiemetics, 1.5 L normal saline with continued pain, nausea, and vomiting.  The hospitalist service was consulted to admit for further evaluation and management.   Review of Systems: All systems reviewed and are negative except as documented in history of present illness above.   Past Medical History:  Diagnosis Date  . AAA (abdominal aortic aneurysm) ([place]) 5/09   3.7x3.3 by u/s 2009  . Abnormal EKG    deep TW inversions chronic  . Anemia   . CAD (coronary artery disease)    a. CABG 03/2011 LIMA to LAD, SVG to OM, SVG to PDA. b. cath 06/25/2015 DES  to SVG to rPDA, patent LIMA to LAD, patent SVG to OM  . carotid stenosis 5/09   S/p L CEA ;  60-79% bilat ICA by preCABG dopplers 7/12  . CHF (congestive heart failure) ([place])   . Chronic back pain    "all over" (06/24/2015)  . Chronic bronchitis ([place])    "get it pretty much q yr" (06/24/2015)  . CKD  (chronic kidney disease)   . Complication of anesthesia 1966   "problem w/ether"  . COPD (chronic obstructive pulmonary disease) ([place])   . Depression   . GERD (gastroesophageal reflux disease)    With hiatal hernia  . GIB (gastrointestinal bleeding) 9/11   S/p EGD with cautery at [facility]  . Heart murmur   . History of blood transfusion "a few times"   "all related to anemia" (06/24/2015)  . History of hiatal hernia   . Hyperlipidemia   . Hypertension   . Mitral regurgitation    3+ MR by intraoperative TEE;  s/p MV repair with Dr. Roxy Manns 7/12  . Myocardial infarction [facility]) 2012 "several"  . On home oxygen therapy    "2 liters at night; negative for sleep apnea"  . PAD (peripheral artery disease) (HCC)    Severe; s/p bilateral renal artery stents, moderate in-stent restenosis  . Pancreatitis 05/2018  . Paroxysmal atrial fibrillation (HCC)    coumadin;  echo 9/07: EF 60%, mild LVH;  s/p Cox Maze 7/12 with LAA clipping  . Subclavian artery stenosis, left (HCC)    stented by Dr. Trula Slade on 10/18 to help flow of her LIMA to LAD  . Type II diabetes mellitus ([place])     Past Surgical History:  Procedure Laterality Date  . A/V FISTULAGRAM Left 03/21/2018   Procedure: A/V FISTULAGRAM;  Surgeon: Serafina Mitchell, MD;  Location: [place] CV LAB;  Service: Cardiovascular;  Laterality: Left;  . ABDOMINAL AORTIC ENDOVASCULAR STENT GRAFT N/A 01/17/2018   Procedure: ABDOMINAL AORTIC ENDOVASCULAR STENT GRAFT WITH CO2;  Surgeon: Serafina Mitchell, MD;  Location: [place];  Service: Vascular;  Laterality: N/A;  . ABDOMINAL HYSTERECTOMY  1990's  . APPENDECTOMY  Aug. 11, 2016   Ruptured  . AV FISTULA PLACEMENT Left 11/03/2017   Procedure: ARTERIOVENOUS (AV) FISTULA CREATION LEFT ARM;  Surgeon: Serafina Mitchell, MD;  Location: [place];  Service: Vascular;  Laterality: Left;  . BOWEL RESECTION  2013  . CARDIAC CATHETERIZATION N/A 06/24/2015   Procedure: Left Heart Cath and Cors/Grafts Angiography;  Surgeon:  Leonie Man, MD;  Location: [place] CV LAB;  Service: Cardiovascular;  Laterality: N/A;  . CARDIAC CATHETERIZATION N/A 06/25/2015   Procedure: Coronary Stent Intervention;  Surgeon: Sherren Mocha, MD;  Location: [place] CV LAB;  Service: Cardiovascular;  Laterality: N/A;  . CAROTID ENDARTERECTOMY Left   . CATARACT EXTRACTION Right 03/27/2018  . CHOLECYSTECTOMY N/A 04/19/2018   Procedure: LAPAROSCOPIC CHOLECYSTECTOMY WITH INTRAOPERATIVE CHOLANGIOGRAM;  Surgeon: Rolm Bookbinder, MD;  Location: [place];  Service: General;  Laterality: N/A;  . CORONARY ANGIOPLASTY    . CORONARY ARTERY BYPASS GRAFT  03/05/2011   CABG X3 (LIMA to LAD, SVG to OM, SVG to PDA, EVH via left thigh  . CORONARY STENT PLACEMENT    . ESOPHAGOGASTRODUODENOSCOPY N/A 04/12/2018   Procedure: ESOPHAGOGASTRODUODENOSCOPY (EGD);  Surgeon: [name], MD;  Location: [facility] ENDOSCOPY;  Service: Endoscopy;  Laterality: N/A;  . ESOPHAGOGASTRODUODENOSCOPY N/A 06/16/2018   Procedure: ESOPHAGOGASTRODUODENOSCOPY (EGD);  Surgeon: Thornton Park, MD;  Location: [place];  Service: Gastroenterology;  Laterality:  N/A;  . LACERATION REPAIR Right 1990's   WRIST  . MAZE  03/05/2011   complete biatrial lesion set with clipping of LA appendage  . MITRAL VALVE REPAIR  03/05/2011   50mm Memo 3D ring annuloplasty for ischemic MR  . PERIPHERAL VASCULAR BALLOON ANGIOPLASTY Left 03/21/2018   Procedure: PERIPHERAL VASCULAR BALLOON ANGIOPLASTY;  Surgeon: Serafina Mitchell, MD;  Location: [place] CV LAB;  Service: Cardiovascular;  Laterality: Left;  . PERIPHERAL VASCULAR CATHETERIZATION N/A 06/24/2015   Procedure: Aortic Arch Angiography;  Surgeon: Serafina Mitchell, MD;  Location: [place] CV LAB;  Service: Cardiovascular;  Laterality: N/A;  . PERIPHERAL VASCULAR CATHETERIZATION  06/24/2015   Procedure: Peripheral Vascular Intervention;  Surgeon: Serafina Mitchell, MD;  Location: [place] CV LAB;  Service: Cardiovascular;;  .  PERIPHERAL VASCULAR CATHETERIZATION N/A 06/24/2015   Procedure: Abdominal Aortogram;  Surgeon: Serafina Mitchell, MD;  Location: [place] CV LAB;  Service: Cardiovascular;  Laterality: N/A;  . RENAL ANGIOGRAM Bilateral 09/11/2013   Procedure: RENAL ANGIOGRAM;  Surgeon: Serafina Mitchell, MD;  Location[facility] CATH LAB;  Service: Cardiovascular;  Laterality: Bilateral;  . RIGHT FEMORAL-POPLITEAL BYPASS      Social History:  reports that she has been smoking cigarettes. She has smoked for the past 50.00 years. She has never used smokeless tobacco. She reports current alcohol use. She reports current drug use. Drug: Marijuana.  Allergies  Allergen Reactions  . Isosorbide Other (See Comments)    Can only tolerate in low doses (unknown reaction) Other reaction(s): Other Unknown "FELT LIKE I WAS GOING TO EXPLODE FROM INSIDE OUT"  . Isosorbide Nitrate Other (See Comments)    Can only tolerate in low doses (unknown reaction)  . Oxycodone-Acetaminophen Itching    Tolerates Hydrocodone.  Also tolerated oxycodone 07/13/18 admission.    . Codeine Itching  . Contrast Media [Iodinated Diagnostic Agents] Itching and Other (See Comments)    Itching of feet  . Ioxaglate Itching and Other (See Comments)    Itching of feet  . Metrizamide Itching and Other (See Comments)    Itching of feet  . Percocet [Oxycodone-Acetaminophen] Itching and Other (See Comments)    Tolerates Hydrocodone    Family History  Problem Relation Age of Onset  . Emphysema Mother   . Heart disease Mother        before age 34  . Hypertension Mother   . Hyperlipidemia Mother   . Heart attack Mother   . AAA (abdominal aortic aneurysm) Mother        rupture  . Heart attack Father 83  . Emphysema Father   . Heart disease Father        before age 24  . Hyperlipidemia Father   . Hypertension Father   . Peripheral vascular disease Father   . Diabetes Brother   . Heart disease Brother        before age 16  . Hyperlipidemia Brother    . Hypertension Brother   . Heart attack Brother        CABG  . Heart disease Other        Vascular disease in grandparents, uncles and dad  . Colon cancer Neg Hx   . Esophageal cancer Neg Hx   . Inflammatory bowel disease Neg Hx   . Liver disease Neg Hx   . Pancreatic cancer Neg Hx   . Rectal cancer Neg Hx   . Stomach cancer Neg Hx      Prior to Admission  medications   Medication Sig Start Date End Date Taking? Authorizing Provider  acetaminophen (TYLENOL) 325 MG tablet Take 325 mg by mouth every 6 (six) hours as needed (for pain).   Yes [provider]  Albuterol Sulfate (PROAIR RESPICLICK) 992 (90 Base) MCG/ACT AEPB Inhale 1 puff into the lungs every 4 (four) hours as needed (shortness of breath/wheezing).   Yes [provider]  allopurinol (ZYLOPRIM) 100 MG tablet Take 100 mg by mouth daily. 02/02/17  Yes [provider]  Amino Acids-Protein Hydrolys (FEEDING SUPPLEMENT, PRO-STAT SUGAR FREE 64,) LIQD Take 30 mLs by mouth 3 (three) times daily with meals. 06/26/18  Yes Thurnell Lose, MD  amLODipine (NORVASC) 5 MG tablet Take 1 tablet (5 mg total) by mouth daily. 08/23/18  Yes Mikhail, Maryann, DO  atorvastatin (LIPITOR) 40 MG tablet Take 40 mg by mouth daily.  12/07/17  Yes [provider]  calcium carbonate (TUMS EX) 750 MG chewable tablet Chew 2 tablets by mouth 3 (three) times daily as needed for heartburn.    Yes [provider]  carvedilol (COREG) 25 MG tablet Take 25 mg by mouth 2 (two) times daily. 04/16/19  Yes [provider]  clopidogrel (PLAVIX) 75 MG tablet Take 1 tablet (75 mg total) by mouth daily with breakfast. 06/26/15  Yes Almyra Deforest, PA  CREON 12000 units CPEP capsule Take 36,000 Units by mouth 2 (two) times daily with a meal. 04/24/19  Yes [provider]  diphenhydrAMINE (BENADRYL) 25 MG tablet Take 50 mg by mouth at bedtime.   Yes [provider]  feeding supplement (BOOST / RESOURCE BREEZE)  LIQD Take 1 Container by mouth 2 (two) times daily.   Yes [provider]  fenofibrate 160 MG tablet Take 1 tablet (160 mg total) by mouth daily. 10/08/15  Yes Delfina Redwood, MD  gabapentin (NEURONTIN) 300 MG capsule Take 1 capsule (300 mg total) by mouth at bedtime. 08/22/18  Yes Mikhail, Velta Addison, DO  hydrALAZINE (APRESOLINE) 25 MG tablet Take 1 tablet (25 mg total) by mouth every 8 (eight) hours. 06/26/18  Yes Thurnell Lose, MD  HYDROmorphone (DILAUDID) 2 MG tablet Take 2 mg by mouth every 4 (four) hours.    Yes [provider]  loperamide (IMODIUM A-D) 2 MG tablet Take 2 mg by mouth 4 (four) times daily as needed for diarrhea or loose stools.   Yes [provider]  OXYGEN Inhale into the lungs at bedtime.   Yes [provider]  pantoprazole (PROTONIX) 40 MG tablet Take 1 tablet (40 mg total) by mouth daily. 08/03/18  Yes Patrecia Pour, MD  polyethylene glycol (MIRALAX / GLYCOLAX) packet Take 17 g by mouth daily as needed for mild constipation. 06/26/18  Yes Thurnell Lose, MD  promethazine (PHENERGAN) 12.5 MG tablet Take 12.5 mg by mouth every 4 (four) hours as needed for nausea or vomiting.   Yes [provider]  traZODone (DESYREL) 100 MG tablet Take 100 mg by mouth at bedtime. 04/19/19  Yes [provider]  ciprofloxacin (CIPRO) 250 MG tablet Take 1 tablet (250 mg total) by mouth daily with breakfast. Patient not taking: Reported on 05/12/2019 09/28/18   Wardell Honour, MD  PROLENSA 0.07 % SOLN Place 2 drops into the left eye at bedtime. 04/17/19   [provider]    Physical Exam: Vitals:   05/12/19 1937 05/12/19 2153  BP: (!) 178/73 (!) 156/63  Pulse: 78 84  Resp: 13 11  Temp: 99  F (37.2 C) 98.9 F (37.2 C)  TempSrc: Oral Oral  SpO2: 99% 97%  Weight: 49.9 kg   Height: 5\' 5"  (1.651 m)     Constitutional: Resting supine in bed, NAD, calm, appears tired but comfortable Eyes: PERRL, lids and conjunctivae  normal ENMT: Mucous membranes are dry. Posterior pharynx clear of any exudate or lesions.Normal dentition.  Neck: normal, supple, no masses. Respiratory: End-expiratory wheezing bilaterally. Normal respiratory effort. No accessory muscle use.  Cardiovascular: Regular rate and rhythm, no murmurs / rubs / gallops. No extremity edema. 2+ pedal pulses.  LUE aVF with palpable thrill. Abdomen: Epigastric and RUQ tenderness to palpation.  Bowel sounds positive.  Musculoskeletal: no clubbing / cyanosis. No joint deformity upper and lower extremities. Good ROM, no contractures. Normal muscle tone.  Skin: no rashes, lesions, ulcers. No induration Neurologic: CN 2-12 grossly intact. Sensation intact, Strength 5/5 in all 4.  Psychiatric: Normal judgment and insight. Alert and oriented x 3. Normal mood.     Labs on Admission: I have personally reviewed following labs and imaging studies  CBC: Recent Labs  Lab 05/12/19 1941  WBC 12.6*  HGB 11.0*  HCT 34.9*  MCV 107.1*  PLT 329   Basic Metabolic Panel: Recent Labs  Lab 05/12/19 1941  NA 136  K 4.4  CL 103  CO2 21*  GLUCOSE 114*  BUN 7*  CREATININE 1.83*  CALCIUM 8.8*   GFR: Estimated Creatinine Clearance: 23.8 mL/min (A) (by C-G formula based on SCr of 1.83 mg/dL (H)). Liver Function Tests: Recent Labs  Lab 05/12/19 1941  AST 19  ALT 12  ALKPHOS 88  BILITOT 0.7  PROT 6.3*  ALBUMIN 3.1*   Recent Labs  Lab 05/12/19 1941  LIPASE 91*   No results for input(s): AMMONIA in the last 168 hours. Coagulation Profile: No results for input(s): INR, PROTIME in the last 168 hours. Cardiac Enzymes: No results for input(s): CKTOTAL, CKMB, CKMBINDEX, TROPONINI in the last 168 hours. BNP (last 3 results) No results for input(s): PROBNP in the last 8760 hours. HbA1C: No results for input(s): HGBA1C in the last 72 hours. CBG: No results for input(s): GLUCAP in the last 168 hours. Lipid Profile: No results for input(s): CHOL, HDL,  LDLCALC, TRIG, CHOLHDL, LDLDIRECT in the last 72 hours. Thyroid Function Tests: No results for input(s): TSH, T4TOTAL, FREET4, T3FREE, THYROIDAB in the last 72 hours. Anemia Panel: No results for input(s): VITAMINB12, FOLATE, FERRITIN, TIBC, IRON, RETICCTPCT in the last 72 hours. Urine analysis:    Component Value Date/Time   COLORURINE AMBER (A) 09/10/2018 0328   APPEARANCEUR CLOUDY (A) 09/10/2018 0328   LABSPEC 1.025 09/28/2018 1934   PHURINE 6.0 09/28/2018 1934   GLUCOSEU NEGATIVE 09/28/2018 1934   HGBUR LARGE (A) 09/28/2018 1934   BILIRUBINUR MODERATE (A) 09/28/2018 1934   KETONESUR TRACE (A) 09/28/2018 1934   PROTEINUR >=300 (A) 09/28/2018 1934   UROBILINOGEN 1.0 09/28/2018 1934   NITRITE NEGATIVE 09/28/2018 1934   LEUKOCYTESUR LARGE (A) 09/28/2018 1934    Radiological Exams on Admission: Ct Abdomen Pelvis Wo Contrast  Result Date: 05/12/2019 CLINICAL DATA:  Abdominal pains, prior pancreatitis EXAM: CT ABDOMEN AND PELVIS WITHOUT CONTRAST TECHNIQUE: Multidetector CT imaging of the abdomen and pelvis was performed following the standard protocol without IV contrast. COMPARISON:  September 14, 2018 FINDINGS: Lower chest: The visualized heart size within normal limits. No pericardial fluid/thickening. No hiatal hernia. The visualized portions of the lungs are clear. Hepatobiliary: Although limited due to the lack of intravenous contrast,  normal in appearance without gross focal abnormality. The patient is status post cholecystectomy. No biliary ductal dilation. Pancreas: The head of the pancreas appears to be enlarged with surrounding fat stranding changes seen at the pancreatic duodenal junction. There is mild dilatation of the first portion of the duodenum which is fluid-filled. The fat stranding changes extend inferiorly adjacent to the inferior hepatic border. No definite loculated fluid collection is seen. Spleen: Normal in size. Although limited due to the lack of intravenous contrast,  normal in appearance. Adrenals/Urinary Tract: Both adrenal glands appear normal. The kidneys and collecting system appear normal without evidence of urinary tract calculus or hydronephrosis. Bladder is unremarkable. Dense renal vascular calcifications are seen. Stomach/Bowel: The stomach, small bowel, and colon are normal in appearance. No inflammatory changes or obstructive findings. appendix is normal. Vascular/Lymphatic: There are no enlarged abdominal or pelvic lymph nodes. The patient is status post aorto bi iliac stent graft. Reproductive: The patient is status post hysterectomy. No adnexal masses or collections seen. Other: No evidence of abdominal wall mass or hernia. Musculoskeletal: No acute or significant osseous findings. IMPRESSION: Pancreatitis involving the pancreatic head and surrounding inflammatory changes involving the pancreatic duodenal junction. No definite loculated fluid collection. Electronically Signed   By: Prudencio Pair M.D.   On: 05/12/2019 21:25    EKG: Independently reviewed. Sinus rhythm with RBBB  Assessment/Plan Principal Problem:   Acute on chronic pancreatitis (HCC) Active Problems:   Essential hypertension   Chronic diastolic heart failure (HCC)   COPD (chronic obstructive pulmonary disease) (HCC)   PAD (peripheral artery disease) (HCC)   Hyperlipidemia   CAD (coronary artery disease)   ESRD (end stage renal disease) ([place])  [name] is a 68 y.o. female with medical history significant for CAD s/p CABG and DES, s/p mitral valve repair, ESRD on TTS HD, atrial fibrillation s/p Maze procedure 2012 not on anticoagulation, chronic diastolic CHF (EF 19-37%, T0WI by TTE 05/2018), PAD s/p b/l renal artery stents and left subclavian stent, AAA s/p endovascular repair 2019, chronic pancreatitis, COPD on 2 L supplemental O2 via [place] at night, HTN, HLD, RBBB, and depression who is admitted with recurrent chronic pancreatitis flareup.   Recurrent acute on chronic  pancreatitis: Initially felt secondary to gallstone pancreatitis however has had multiple episodes since cholecystectomy.  Extensive prior work-up so far unrevealing but suggestive of chronic pancreatitis.  CT again shows changes suggestive of pancreatitis involving the pancreatic head and pancreatic duodenal junction. -Continue IV fluids with D5-1/2NS @ 100/hr overnight -N.p.o. except sips with meds, advance diet as tolerated -Continue pain control with IV Dilaudid 2 mg q2h prn severe pain with hold precautions -Continue as needed antiemetics  ESRD on TTS HD: Completed full regular session on 05/12/2019.  Follows with [place] [facility].  No emergent need for dialysis.  Will admit to [facility] hospital for dialysis needs as indicated.  GERD: Reports worsening indigestion/heartburn since not able to take oral Protonix.  Will start IV Protonix 40 mg daily.  CAD s/p CABG/DES PAD: Chronic and stable.  Continue carvedilol, Plavix, and atorvastatin.  Atrial fibrillation post maze procedure: Off of anticoagulation since maze procedure in 2012.  Remains in sinus rhythm.  COPD: Wheezing on exam without respiratory distress or hypoxia.  Continue as needed albuterol and supplemental O2 at night.  Hypertension: Continue home amlodipine, carvedilol, hydralazine.  Hyperlipidemia: Continue atorvastatin.  Chronic diastolic CHF: EF 09-73%, Z3GD by TTE 05/2018.  Volume control with dialysis.  Appears hypovolemic on admission.  Continue IV fluids as  above and monitor strict I/O's.  Depression: Continue trazodone at night.  DVT prophylaxis: Subcutaneous heparin Code Status: Full code, confirmed with patient Family Communication: Patient discussed with family Disposition Plan: Admit to [place] called: None Admission status: Inpatient as patient will likely require greater than 2 midnight length stay for further management of acute on chronic pancreatitis requiring IV fluid  resuscitation, pain management, and need for adequate oral intake prior to discharge.   Zada Finders MD [facility]  If 7PM-7AM, please contact night-coverage [email or web address]  05/12/2019, 10:58 PM

## 2019-05-12 NOTE — ED Provider Notes (Signed)
[facility] DEPT Provider Note   CSN: [id number] Arrival date & time: 05/12/19  3474     History   Chief Complaint Chief Complaint  Patient presents with  . Pancreatitis    HPI DORE OQUIN is a 68 y.o. female hx of AAA, CAD, recurrent pancreatitis, ESRD on HD (last HD was earlier today), here presenting with epigastric pain, vomiting.  Patient states that recently she was admitted for pancreatitis and had a CT that showed acute pancreatitis.  Patient's lipase was 2000 and trended down to about 90.  She was discharged home on Dilaudid pills.  She states that since yesterday her pain got suddenly worse in the epigastrium.  States that it is sharp and constant. She was able to finish her dialysis today.  She also started vomiting and came here for evaluation.      The history is provided by the patient.    Past Medical History:  Diagnosis Date  . AAA (abdominal aortic aneurysm) ([place]) 5/09   3.7x3.3 by u/s 2009  . Abnormal EKG    deep TW inversions chronic  . Anemia   . CAD (coronary artery disease)    a. CABG 03/2011 LIMA to LAD, SVG to OM, SVG to PDA. b. cath 06/25/2015 DES to SVG to rPDA, patent LIMA to LAD, patent SVG to OM  . carotid stenosis 5/09   S/p L CEA ;  60-79% bilat ICA by preCABG dopplers 7/12  . CHF (congestive heart failure) ([place])   . Chronic back pain    "all over" (06/24/2015)  . Chronic bronchitis ([place])    "get it pretty much q yr" (06/24/2015)  . CKD (chronic kidney disease)   . Complication of anesthesia 1966   "problem w/ether"  . COPD (chronic obstructive pulmonary disease) ([place])   . Depression   . GERD (gastroesophageal reflux disease)    With hiatal hernia  . GIB (gastrointestinal bleeding) 9/11   S/p EGD with cautery at [facility]  . Heart murmur   . History of blood transfusion "a few times"   "all related to anemia" (06/24/2015)  . History of hiatal hernia   . Hyperlipidemia   . Hypertension   . Mitral regurgitation     3+ MR by intraoperative TEE;  s/p MV repair with Dr. Roxy Manns 7/12  . Myocardial infarction [facility]) 2012 "several"  . On home oxygen therapy    "2 liters at night; negative for sleep apnea"  . PAD (peripheral artery disease) (HCC)    Severe; s/p bilateral renal artery stents, moderate in-stent restenosis  . Pancreatitis 05/2018  . Paroxysmal atrial fibrillation (HCC)    coumadin;  echo 9/07: EF 60%, mild LVH;  s/p Cox Maze 7/12 with LAA clipping  . Subclavian artery stenosis, left (HCC)    stented by Dr. Trula Slade on 10/18 to help flow of her LIMA to LAD  . Type II diabetes mellitus [facility])     Patient Active Problem List   Diagnosis Date Noted  . Acute on chronic pancreatitis ([place]) 05/12/2019  . Abnormal CT of the abdomen 09/25/2018  . Pancreatic divisum 09/25/2018  . Pancreatic mass   . ESRD (end stage renal disease) ([place]) 09/09/2018  . Renal failure (ARF), acute on chronic (HCC) 08/15/2018  . ARF (acute renal failure) ([place]) 08/15/2018  . CKD (chronic kidney disease), stage V ([place]) 08/15/2018  . Hypomagnesemia 08/15/2018  . Acute pulmonary edema ([place]) 08/14/2018  . Goals of care, counseling/discussion   . DNR (do not  resuscitate) discussion   . AKI (acute kidney injury) ([place]) 07/26/2018  . Acute encephalopathy 07/24/2018  . Chronic pain 07/24/2018  . Malnutrition of moderate degree 07/13/2018  . Stage II pressure ulcer ([place]) 07/12/2018  . Fluid collection at surgical site   . Intraabdominal fluid collection   . Cystic duct calculus   . RUQ pain 07/08/2018  . Peripancreatic fluid collection 07/04/2018  . 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 Urinary retention 07/04/2018  . S/P insertion of iliac artery stent 07/04/2018  . Endoleak post (EVAR) endovascular aneurysm repair ([place]) 07/04/2018  . Physical deconditioning   . Esophageal ulcer with bleeding   . Pressure injury of skin 06/16/2018  . Hematemesis   . Palliative care patient   . Palliative care by specialist   .  Anasarca 06/07/2018  . Demand ischemia ([place]) 06/07/2018  . Protein-calorie malnutrition, severe 05/29/2018  . Type II diabetes mellitus with renal manifestations ([place]) 05/28/2018  . Recurrent pancreatitis 05/28/2018  . Hypokalemia 05/28/2018  . SIRS (systemic inflammatory response syndrome) ([place]) 05/28/2018  . Acute recurrent pancreatitis 05/28/2018  . Volume overload 04/17/2018  . Anemia due to GI blood loss 04/14/2018  . Acute gallstone pancreatitis 04/14/2018  . Acute cholecystitis 04/14/2018  . Gastrointestinal hemorrhage   . Fatigue associated with anemia 04/09/2018  . Hypoglycemia 01/19/2018  . Type II diabetes mellitus ([place]) 01/19/2018  . Nausea with vomiting 01/19/2018  . Uncontrolled hypertension 01/19/2018  . Hyperlipidemia 01/19/2018  . CAD (coronary artery disease) 01/19/2018  . COPD with hypoxia ([place]) 01/19/2018  . Chronic kidney disease (CKD), stage IV (severe) ([place]) 01/19/2018  . Retroperitoneal hematoma 01/19/2018  . Acute blood loss anemia 01/19/2018  . Chronic diastolic congestive heart failure ([place]) 01/19/2018  . AAA (abdominal aortic aneurysm) ([place]) 01/16/2018  . Elevated brain natriuretic peptide (BNP) level 10/07/2015  . Diabetes mellitus ([place]) 10/07/2015  . Pain in the chest   . PAD (peripheral artery disease) ([place]) 07/29/2015  . Chest pain 07/04/2015  . Acute renal failure superimposed on stage 4 chronic kidney disease ([place]) 07/04/2015  . GERD (gastroesophageal reflux disease) 07/04/2015  . Anemia 06/26/2015  . Coronary artery disease involving autologous vein coronary bypass graft with unstable angina pectoris ([place]) 06/24/2015  . Angina at rest [facility])   . Subclavian artery stenosis, left ([place]) 06/23/2015  . AAA (abdominal aortic aneurysm) without rupture ([place]) 12/10/2014  . Subclavian steal syndrome 12/10/2014  . Renal artery stenosis ([place]) 09/04/2013  . Carotid artery stenosis 09/28/2012  . Mitral valve disorder 06/23/2011  . S/P CABG x 3 06/16/2011   . S/P Maze operation for atrial fibrillation 06/16/2011  . S/P MVR (mitral valve repair) 06/16/2011  . Follow-up examination following surgery 03/29/2011  . CKD (chronic kidney disease) 03/03/2011  . COPD (chronic obstructive pulmonary disease) ([place]) 03/03/2011  . Chronic diastolic heart failure ([place]) 02/26/2011  . Hypotension 02/26/2011  . NSTEMI (non-ST elevated myocardial infarction) ([place]) 02/26/2011  . Long term (current) use of anticoagulants 12/17/2010  . OBSTRUCTIVE SLEEP APNEA 09/04/2010  . HYPOXEMIA 09/04/2010  . SNORING 08/05/2010  . Atrial fibrillation ([place]) 12/09/2009  . WEIGHT LOSS 03/28/2009  . PALPITATIONS 03/28/2009  . PVD 11/21/2008  . HYPERLIPIDEMIA-MIXED 11/20/2008  . TOBACCO ABUSE 11/20/2008  . Essential hypertension 11/20/2008  . Coronary artery disease involving native heart with angina pectoris ([place]) 11/20/2008  . ABDOMINAL AORTIC ANEURYSM 11/20/2008    Past Surgical History:  Procedure Laterality Date  . A/V FISTULAGRAM Left 03/21/2018   Procedure: A/V FISTULAGRAM;  Surgeon: Serafina Mitchell, MD;  Location: [facility] CV LAB;  Service: Cardiovascular;  Laterality: Left;  . ABDOMINAL AORTIC ENDOVASCULAR STENT GRAFT N/A 01/17/2018   Procedure: ABDOMINAL AORTIC ENDOVASCULAR STENT GRAFT WITH CO2;  Surgeon: Serafina Mitchell, MD;  Location: [facility];  Service: Vascular;  Laterality: N/A;  . ABDOMINAL HYSTERECTOMY  1990's  . APPENDECTOMY  Aug. 11, 2016   Ruptured  . AV FISTULA PLACEMENT Left 11/03/2017   Procedure: ARTERIOVENOUS (AV) FISTULA CREATION LEFT ARM;  Surgeon: Serafina Mitchell, MD;  Location: [facility];  Service: Vascular;  Laterality: Left;  . BOWEL RESECTION  2013  . CARDIAC CATHETERIZATION N/A 06/24/2015   Procedure: Left Heart Cath and Cors/Grafts Angiography;  Surgeon: Leonie Man, MD;  Location: [facility] CV LAB;  Service: Cardiovascular;  Laterality: N/A;  . CARDIAC CATHETERIZATION N/A 06/25/2015   Procedure: Coronary Stent Intervention;  Surgeon:  Sherren Mocha, MD;  Location: [facility] CV LAB;  Service: Cardiovascular;  Laterality: N/A;  . CAROTID ENDARTERECTOMY Left   . CATARACT EXTRACTION Right 03/27/2018  . CHOLECYSTECTOMY N/A 04/19/2018   Procedure: LAPAROSCOPIC CHOLECYSTECTOMY WITH INTRAOPERATIVE CHOLANGIOGRAM;  Surgeon: Rolm Bookbinder, MD;  Location: [facility];  Service: General;  Laterality: N/A;  . CORONARY ANGIOPLASTY    . CORONARY ARTERY BYPASS GRAFT  03/05/2011   CABG X3 (LIMA to LAD, SVG to OM, SVG to PDA, EVH via left thigh  . CORONARY STENT PLACEMENT    . ESOPHAGOGASTRODUODENOSCOPY N/A 04/12/2018   Procedure: ESOPHAGOGASTRODUODENOSCOPY (EGD);  Surgeon: [name], MD;  Location: [facility] ENDOSCOPY;  Service: Endoscopy;  Laterality: N/A;  . ESOPHAGOGASTRODUODENOSCOPY N/A 06/16/2018   Procedure: ESOPHAGOGASTRODUODENOSCOPY (EGD);  Surgeon: Thornton Park, MD;  Location: [facility];  Service: Gastroenterology;  Laterality: N/A;  . LACERATION REPAIR Right 1990's   WRIST  . MAZE  03/05/2011   complete biatrial lesion set with clipping of LA appendage  . MITRAL VALVE REPAIR  03/05/2011   25mm Memo 3D ring annuloplasty for ischemic MR  . PERIPHERAL VASCULAR BALLOON ANGIOPLASTY Left 03/21/2018   Procedure: PERIPHERAL VASCULAR BALLOON ANGIOPLASTY;  Surgeon: Serafina Mitchell, MD;  Location: [facility] CV LAB;  Service: Cardiovascular;  Laterality: Left;  . PERIPHERAL VASCULAR CATHETERIZATION N/A 06/24/2015   Procedure: Aortic Arch Angiography;  Surgeon: Serafina Mitchell, MD;  Location: [facility] CV LAB;  Service: Cardiovascular;  Laterality: N/A;  . PERIPHERAL VASCULAR CATHETERIZATION  06/24/2015   Procedure: Peripheral Vascular Intervention;  Surgeon: Serafina Mitchell, MD;  Location: [facility] CV LAB;  Service: Cardiovascular;;  . PERIPHERAL VASCULAR CATHETERIZATION N/A 06/24/2015   Procedure: Abdominal Aortogram;  Surgeon: Serafina Mitchell, MD;  Location: [facility] CV LAB;  Service: Cardiovascular;  Laterality: N/A;  .  RENAL ANGIOGRAM Bilateral 09/11/2013   Procedure: RENAL ANGIOGRAM;  Surgeon: Serafina Mitchell, MD;  Location: [facility] CATH LAB;  Service: Cardiovascular;  Laterality: Bilateral;  . RIGHT FEMORAL-POPLITEAL BYPASS       OB History   No obstetric history on file.      Home Medications    Prior to Admission medications   Medication Sig Start Date End Date Taking? Authorizing Provider  acetaminophen (TYLENOL) 325 MG tablet Take 325 mg by mouth every 6 (six) hours as needed (for pain).   Yes [provider]  Albuterol Sulfate (PROAIR RESPICLICK) 976 (90 Base) MCG/ACT AEPB Inhale 1 puff into the lungs every 4 (four) hours as needed (shortness of breath/wheezing).   Yes [provider]  allopurinol (ZYLOPRIM) 100 MG tablet Take 100 mg by mouth daily. 02/02/17  Yes  [provider]  Amino Acids-Protein Hydrolys (FEEDING SUPPLEMENT, PRO-STAT SUGAR FREE 64,) LIQD Take 30 mLs by mouth 3 (three) times daily with meals. 06/26/18  Yes Thurnell Lose, MD  amLODipine (NORVASC) 5 MG tablet Take 1 tablet (5 mg total) by mouth daily. 08/23/18  Yes Mikhail, Maryann, DO  atorvastatin (LIPITOR) 40 MG tablet Take 40 mg by mouth daily.  12/07/17  Yes [provider]  calcium carbonate (TUMS EX) 750 MG chewable tablet Chew 2 tablets by mouth 3 (three) times daily as needed for heartburn.    Yes [provider]  carvedilol (COREG) 25 MG tablet Take 25 mg by mouth 2 (two) times daily. 04/16/19  Yes [provider]  clopidogrel (PLAVIX) 75 MG tablet Take 1 tablet (75 mg total) by mouth daily with breakfast. 06/26/15  Yes Almyra Deforest, PA  CREON 12000 units CPEP capsule Take 36,000 Units by mouth 2 (two) times daily with a meal. 04/24/19  Yes [provider]  diphenhydrAMINE (BENADRYL) 25 MG tablet Take 50 mg by mouth at bedtime.   Yes [provider]  feeding supplement (BOOST / RESOURCE BREEZE) LIQD Take 1 Container by mouth 2 (two) times daily.   Yes [provider]  fenofibrate 160 MG tablet Take 1 tablet (160 mg total) by mouth daily. 10/08/15  Yes Delfina Redwood, MD  gabapentin (NEURONTIN) 300 MG capsule Take 1 capsule (300 mg total) by mouth at bedtime. 08/22/18  Yes Mikhail, Velta Addison, DO  hydrALAZINE (APRESOLINE) 25 MG tablet Take 1 tablet (25 mg total) by mouth every 8 (eight) hours. 06/26/18  Yes Thurnell Lose, MD  HYDROmorphone (DILAUDID) 2 MG tablet Take 2 mg by mouth every 4 (four) hours.    Yes [provider]  loperamide (IMODIUM A-D) 2 MG tablet Take 2 mg by mouth 4 (four) times daily as needed for diarrhea or loose stools.   Yes [provider]  OXYGEN Inhale into the lungs at bedtime.   Yes [provider]  pantoprazole (PROTONIX) 40 MG tablet Take 1 tablet (40 mg total) by mouth daily. 08/03/18  Yes Patrecia Pour, MD  polyethylene glycol (MIRALAX / GLYCOLAX) packet Take 17 g by mouth daily as needed for mild constipation. 06/26/18  Yes Thurnell Lose, MD  promethazine (PHENERGAN) 12.5 MG tablet Take 12.5 mg by mouth every 4 (four) hours as needed for nausea or vomiting.   Yes [provider]  traZODone (DESYREL) 100 MG tablet Take 100 mg by mouth at bedtime. 04/19/19  Yes [provider]  ciprofloxacin (CIPRO) 250 MG tablet Take 1 tablet (250 mg total) by mouth daily with breakfast. Patient not taking: Reported on 05/12/2019 09/28/18   Wardell Honour, MD  PROLENSA 0.07 % SOLN Place 2 drops into the left eye at bedtime. 04/17/19   [provider]    Family History Family History  Problem Relation Age of Onset  . Emphysema Mother   . Heart disease Mother        before age 48  . Hypertension Mother   . Hyperlipidemia Mother   . Heart attack Mother   . AAA (abdominal aortic aneurysm) Mother        rupture  . Heart attack Father 74  . Emphysema Father   . Heart disease Father        before age 34  . Hyperlipidemia Father   . Hypertension Father   . Peripheral  vascular disease Father   . Diabetes Brother   .  Heart disease Brother        before age 53  . Hyperlipidemia Brother   . Hypertension Brother   . Heart attack Brother        CABG  . Heart disease Other        Vascular disease in grandparents, uncles and dad  . Colon cancer Neg Hx   . Esophageal cancer Neg Hx   . Inflammatory bowel disease Neg Hx   . Liver disease Neg Hx   . Pancreatic cancer Neg Hx   . Rectal cancer Neg Hx   . Stomach cancer Neg Hx     Social History Social History   Tobacco Use  . Smoking status: Current Every Day Smoker    Years: 50.00    Types: Cigarettes  . Smokeless tobacco: Never Used  . Tobacco comment: 3/4 pk per day  Substance Use Topics  . Alcohol use: Yes    Alcohol/week: 0.0 standard drinks    Comment: occasionally  . Drug use: Yes    Types: Marijuana     Allergies   Isosorbide, Isosorbide nitrate, Oxycodone-acetaminophen, Codeine, Contrast media [iodinated diagnostic agents], Ioxaglate, Metrizamide, and Percocet [oxycodone-acetaminophen]   Review of Systems Review of Systems  Gastrointestinal: Positive for abdominal pain.  All other systems reviewed and are negative.    Physical Exam Updated Vital Signs BP (!) 156/63 (BP Location: Right Arm)   Pulse 84   Temp 98.9 F (37.2 C) (Oral)   Resp 11   Ht 5\' 5"  (1.651 m)   Wt 49.9 kg   SpO2 97%   BMI 18.30 kg/m   Physical Exam Vitals signs and nursing note reviewed.  Constitutional:      Comments: Uncomfortable, vomiting   HENT:     Head: Normocephalic.     Nose: Nose normal.     Mouth/Throat:     Mouth: Mucous membranes are dry.  Eyes:     Extraocular Movements: Extraocular movements intact.     Pupils: Pupils are equal, round, and reactive to light.  Neck:     Musculoskeletal: Normal range of motion.  Cardiovascular:     Rate and Rhythm: Normal rate and regular rhythm.     Pulses: Normal pulses.  Pulmonary:     Effort: Pulmonary effort is normal.     Breath  sounds: Normal breath sounds.  Abdominal:     General: Abdomen is flat.     Comments: + epigastric tenderness, mild guarding   Musculoskeletal: Normal range of motion.  Skin:    General: Skin is warm.     Capillary Refill: Capillary refill takes less than 2 seconds.  Neurological:     General: No focal deficit present.     Mental Status: She is oriented to person, place, and time.  Psychiatric:        Mood and Affect: Mood normal.        Behavior: Behavior normal.      ED Treatments / Results  Labs (all labs ordered are listed, but only abnormal results are displayed) Labs Reviewed  LIPASE, BLOOD - Abnormal; Notable for the following components:      Result Value   Lipase 91 (*)    All other components within normal limits  COMPREHENSIVE METABOLIC PANEL - Abnormal; Notable for the following components:   CO2 21 (*)    Glucose, Bld 114 (*)    BUN 7 (*)    Creatinine, Ser 1.83 (*)    Calcium 8.8 (*)  Total Protein 6.3 (*)    Albumin 3.1 (*)    GFR calc non Af Amer 28 (*)    GFR calc Af Amer 33 (*)    All other components within normal limits  CBC - Abnormal; Notable for the following components:   WBC 12.6 (*)    RBC 3.26 (*)    Hemoglobin 11.0 (*)    HCT 34.9 (*)    MCV 107.1 (*)    All other components within normal limits  TROPONIN I (HIGH SENSITIVITY) - Abnormal; Notable for the following components:   Troponin I (High Sensitivity) 27 (*)    All other components within normal limits  SARS CORONAVIRUS 2 (TAT 6-24 HRS)  HIV ANTIBODY (ROUTINE TESTING W REFLEX)  CBC  COMPREHENSIVE METABOLIC PANEL    EKG None  Radiology Ct Abdomen Pelvis Wo Contrast  Result Date: 05/12/2019 CLINICAL DATA:  Abdominal pains, prior pancreatitis EXAM: CT ABDOMEN AND PELVIS WITHOUT CONTRAST TECHNIQUE: Multidetector CT imaging of the abdomen and pelvis was performed following the standard protocol without IV contrast. COMPARISON:  September 14, 2018 FINDINGS: Lower chest: The  visualized heart size within normal limits. No pericardial fluid/thickening. No hiatal hernia. The visualized portions of the lungs are clear. Hepatobiliary: Although limited due to the lack of intravenous contrast, normal in appearance without gross focal abnormality. The patient is status post cholecystectomy. No biliary ductal dilation. Pancreas: The head of the pancreas appears to be enlarged with surrounding fat stranding changes seen at the pancreatic duodenal junction. There is mild dilatation of the first portion of the duodenum which is fluid-filled. The fat stranding changes extend inferiorly adjacent to the inferior hepatic border. No definite loculated fluid collection is seen. Spleen: Normal in size. Although limited due to the lack of intravenous contrast, normal in appearance. Adrenals/Urinary Tract: Both adrenal glands appear normal. The kidneys and collecting system appear normal without evidence of urinary tract calculus or hydronephrosis. Bladder is unremarkable. Dense renal vascular calcifications are seen. Stomach/Bowel: The stomach, small bowel, and colon are normal in appearance. No inflammatory changes or obstructive findings. appendix is normal. Vascular/Lymphatic: There are no enlarged abdominal or pelvic lymph nodes. The patient is status post aorto bi iliac stent graft. Reproductive: The patient is status post hysterectomy. No adnexal masses or collections seen. Other: No evidence of abdominal wall mass or hernia. Musculoskeletal: No acute or significant osseous findings. IMPRESSION: Pancreatitis involving the pancreatic head and surrounding inflammatory changes involving the pancreatic duodenal junction. No definite loculated fluid collection. Electronically Signed   By: Prudencio Pair M.D.   On: 05/12/2019 21:25    Procedures Procedures (including critical care time)  Medications Ordered in ED Medications  heparin injection 5,000 Units (has no administration in time range)   acetaminophen (TYLENOL) tablet 650 mg (has no administration in time range)    Or  acetaminophen (TYLENOL) suppository 650 mg (has no administration in time range)  ondansetron (ZOFRAN) tablet 4 mg (has no administration in time range)    Or  ondansetron (ZOFRAN) injection 4 mg (has no administration in time range)  calcium carbonate (TUMS - dosed in mg elemental calcium) chewable tablet 200 mg of elemental calcium (has no administration in time range)  pantoprazole (PROTONIX) injection 40 mg (has no administration in time range)  HYDROmorphone (DILAUDID) injection 2 mg (has no administration in time range)  Albuterol Sulfate AEPB 1 puff (has no administration in time range)  amLODipine (NORVASC) tablet 5 mg (has no administration in time range)  atorvastatin (LIPITOR)  tablet 40 mg (has no administration in time range)  carvedilol (COREG) tablet 25 mg (has no administration in time range)  clopidogrel (PLAVIX) tablet 75 mg (has no administration in time range)  diphenhydrAMINE (BENADRYL) capsule 50 mg (has no administration in time range)  gabapentin (NEURONTIN) capsule 300 mg (has no administration in time range)  hydrALAZINE (APRESOLINE) tablet 25 mg (has no administration in time range)  traZODone (DESYREL) tablet 100 mg (has no administration in time range)  dextrose 5 %-0.45 % sodium chloride infusion (has no administration in time range)  sodium chloride flush (NS) 0.9 % injection 3 mL (3 mLs Intravenous Given 05/12/19 1943)  HYDROmorphone (DILAUDID) injection 1 mg (1 mg Intravenous Given 05/12/19 2017)  sodium chloride 0.9 % bolus 500 mL (0 mLs Intravenous Stopped 05/12/19 2125)  ondansetron (ZOFRAN) injection 4 mg (4 mg Intravenous Given 05/12/19 2017)  HYDROmorphone (DILAUDID) injection 2 mg (2 mg Intravenous Given 05/12/19 2133)  sodium chloride 0.9 % bolus 1,000 mL (0 mLs Intravenous Stopped 05/12/19 2240)  promethazine (PHENERGAN) injection 12.5 mg (12.5 mg Intravenous Given 05/12/19 2152)      Initial Impression / Assessment and Plan / ED Course  I have reviewed the triage vital signs and the nursing notes.  Pertinent labs & imaging results that were available during my care of the patient were reviewed by me and considered in my medical decision making (see chart for details).       LURLEAN KERNEN is a 68 y.o. female here with epigastric pain. Hx of pancreatitis with recent admission for pain control. She is a dialysis patient and was dialyzed earlier today. Will get labs, LFTs, lipase, repeat CT ab/pel. Will give pain meds and reassess.   10 pm Patient's lipase is 91. Chemistry is baseline. CT ab/pel showed pancreatitis. Required multiple doses of pain meds and IVF and still uncomfortable. Will admit for pain control for pancreatitis.   Final Clinical Impressions(s) / ED Diagnoses   Final diagnoses:  None    ED Discharge Orders    None       Drenda Freeze, MD 05/12/19 2304

## 2019-05-12 NOTE — ED Triage Notes (Signed)
Pt comes to ed via ems, c/o pain in right upper quad, abdominal pain.  Pt is dialysis and pre diagnosed with pancreatic pain, x 2 days. Pt is guarding, area. Alert x 4, pain is 7 out 10 currently. V/s on arrival 177/70, hr 80 rr 14, spo2 97 room air, cbg 102, pt has received 64mcg internasal 1831, 50 mcg IM in right deltoid, @1847  by ems.

## 2019-05-13 ENCOUNTER — Other Ambulatory Visit: Payer: Self-pay

## 2019-05-13 ENCOUNTER — Encounter (HOSPITAL_COMMUNITY): Payer: Self-pay | Admitting: *Deleted

## 2019-05-13 LAB — CBC
HCT: 30.3 % — ABNORMAL LOW (ref 36.0–46.0)
Hemoglobin: 9.3 g/dL — ABNORMAL LOW (ref 12.0–15.0)
MCH: 33.5 pg (ref 26.0–34.0)
MCHC: 30.7 g/dL (ref 30.0–36.0)
MCV: 109 fL — ABNORMAL HIGH (ref 80.0–100.0)
Platelets: 267 10*3/uL (ref 150–400)
RBC: 2.78 MIL/uL — ABNORMAL LOW (ref 3.87–5.11)
RDW: 14.3 % (ref 11.5–15.5)
WBC: 10.2 10*3/uL (ref 4.0–10.5)
nRBC: 0 % (ref 0.0–0.2)

## 2019-05-13 LAB — COMPREHENSIVE METABOLIC PANEL
ALT: 9 U/L (ref 0–44)
AST: 17 U/L (ref 15–41)
Albumin: 2.4 g/dL — ABNORMAL LOW (ref 3.5–5.0)
Alkaline Phosphatase: 70 U/L (ref 38–126)
Anion gap: 8 (ref 5–15)
BUN: 8 mg/dL (ref 8–23)
CO2: 19 mmol/L — ABNORMAL LOW (ref 22–32)
Calcium: 7.5 mg/dL — ABNORMAL LOW (ref 8.9–10.3)
Chloride: 103 mmol/L (ref 98–111)
Creatinine, Ser: 1.97 mg/dL — ABNORMAL HIGH (ref 0.44–1.00)
GFR calc Af Amer: 30 mL/min — ABNORMAL LOW (ref >60)
GFR calc non Af Amer: 26 mL/min — ABNORMAL LOW (ref >60)
Glucose, Bld: 257 mg/dL — ABNORMAL HIGH (ref 70–99)
Potassium: 3.7 mmol/L (ref 3.5–5.1)
Sodium: 130 mmol/L — ABNORMAL LOW (ref 135–145)
Total Bilirubin: 0.8 mg/dL (ref 0.3–1.2)
Total Protein: 5.2 g/dL — ABNORMAL LOW (ref 6.5–8.1)

## 2019-05-13 LAB — MRSA PCR SCREENING: MRSA by PCR: NEGATIVE

## 2019-05-13 LAB — HIV ANTIBODY (ROUTINE TESTING W REFLEX): HIV Screen 4th Generation wRfx: NONREACTIVE

## 2019-05-13 LAB — SARS CORONAVIRUS 2 (TAT 6-24 HRS): SARS Coronavirus 2: NEGATIVE

## 2019-05-13 MED ORDER — LIDOCAINE 5 % EX PTCH
1.0000 | MEDICATED_PATCH | CUTANEOUS | Status: DC
  Administered 2019-05-13 – 2019-05-17 (×5): 1 via TRANSDERMAL
  Filled 2019-05-13 (×6): qty 1

## 2019-05-13 MED ORDER — SODIUM CHLORIDE 0.9 % IV SOLN
Freq: Once | INTRAVENOUS | Status: AC
  Administered 2019-05-13: 16:00:00 via INTRAVENOUS

## 2019-05-13 MED ORDER — PROMETHAZINE HCL 25 MG/ML IJ SOLN
12.5000 mg | Freq: Once | INTRAMUSCULAR | Status: DC
  Filled 2019-05-13: qty 1

## 2019-05-13 MED ORDER — HYDROMORPHONE HCL 1 MG/ML IJ SOLN
2.0000 mg | INTRAMUSCULAR | Status: DC | PRN
  Administered 2019-05-13 – 2019-05-17 (×25): 2 mg via INTRAVENOUS
  Filled 2019-05-13 (×22): qty 2

## 2019-05-13 NOTE — Progress Notes (Signed)
New Admission Note:   Arrival Method: [facility] via carelink Mental Orientation: A&Ox4 Telemetry: N/A Assessment:refer to flowsheet Skin: refer to flowsheet IV: RAC, SL Pain: 7/10 Tubes: None Safety Measures: Safety Fall Prevention Plan has been discussed  Admission: to be completed 5 [place] Orientation: Patient has been orientated to the room, unit and staff.   Family: none at bedside  Orders to be reviewed and implemented. Will continue to monitor the patient. Call light has been placed within reach and bed alarm has been activated.

## 2019-05-13 NOTE — Progress Notes (Signed)
[name]  PROGRESS NOTE    [name]  OMB:[id number] DOB: [birth year] DOA: 05/12/2019 PCP: Bernerd Limbo, MD   Brief Narrative:   [name] is a 68 y.o. female with medical history significant for CAD s/p CABG and DES, s/p mitral valve repair, ESRD on TTS HD, atrial fibrillation s/p Maze procedure 2012 not on anticoagulation, chronic diastolic CHF (EF 45-36%, I6OE by TTE 05/2018), PAD s/p b/l renal artery stents and left subclavian stent, AAA s/p endovascular repair 2019, chronic pancreatitis, COPD on 2 L supplemental O2 via [place] at night, HTN, HLD, RBBB, and depression who presents to the ED for epigastric pain associated with nausea and vomiting.  05/13/2019: Reports improved pain this AM   Assessment & Plan:   Principal Problem:   Acute on chronic pancreatitis ([place]) Active Problems:   Essential hypertension   Chronic diastolic heart failure (HCC)   COPD (chronic obstructive pulmonary disease) (HCC)   PAD (peripheral artery disease) (HCC)   Hyperlipidemia   CAD (coronary artery disease)   ESRD (end stage renal disease) ([place])   Recurrent acute on chronic pancreatitis:     - Initially felt secondary to gallstone pancreatitis however has had multiple episodes since cholecystectomy.       - Extensive prior work-up so far unrevealing but suggestive of chronic pancreatitis.       - CT again shows changes suggestive of pancreatitis involving the pancreatic head and pancreatic duodenal junction.     - NS 100 cc/hr for 1L     - CLD, advance diet as tolerated     - Continue pain control with IV Dilaudid 2 mg q2h prn severe pain with hold precautions     - Continue as needed antiemetics  ESRD on TTS HD:     - Completed full regular session on 05/12/2019.       - Follows with [facility]; if still here for Tuesday will need nephro consult     - No emergent need for dialysis.   GERD:     - Reports worsening indigestion/heartburn      - IV Protonix 40 mg daily; transition to PO when  able  CAD s/p CABG/DES PAD:     - Chronic and stable.       - Continue carvedilol, Plavix, and atorvastatin.  Atrial fibrillation post maze procedure:     - Off of anticoagulation since maze procedure in 2012.       - Remains in sinus rhythm.  COPD:     - PRN albuterol and supplemental O2 at night.  Hypertension:     - Continue home amlodipine, carvedilol, hydralazine.  Hyperlipidemia:     - Continue atorvastatin.  Chronic diastolic CHF:     - EF 32-12%, G2DD by TTE 05/2018.       - Volume control with dialysis.       - Continue IV fluids as above and monitor strict I/O's; daily weights  Depression:     - Continue trazodone at night.  Pain improving. Start CLD and advance as tolerated. Watch fluids.  DVT prophylaxis: Heparin Code Status: FULL Family Communication: None at bedside   Disposition Plan: TBD   ROS:  Reports abdominal pain. Denies CP, N, V. Remainder 10-pt ROS is negative for all not previously mentioned.  Subjective: "It feels a little better. But I just got some meds."  Objective: Vitals:   05/13/19 1000 05/13/19 1030 05/13/19 1100 05/13/19 1515  BP: (!) 128/56 140/60 (!) 154/64 (!) 158/62  Pulse: (!) 57 (!) 58 (!) 58 63  Resp:   16   Temp:    98.9 F (37.2 C)  TempSrc:    Oral  SpO2: 90% 96% 93% 95%  Weight:      Height:       No intake or output data in the 24 hours ending 05/13/19 1535 Filed Weights   05/12/19 1937  Weight: 49.9 kg    Examination:  General: 68 y.o. female resting in bed in NAD Eyes: PERRL, normal sclera Cardiovascular: RRR, +S1, S2, no m/g/r, equal pulses throughout Respiratory: CTABL, no w/r/r, normal WOB GI: BS+, NDNT, no masses noted, no organomegaly noted MSK: No e/c/c Skin: No rashes, bruises, ulcerations noted Neuro: A&O x 3, no focal deficits   Data Reviewed: I have personally reviewed following labs and imaging studies.  CBC: Recent Labs  Lab 05/12/19 1941 05/13/19 0445  WBC 12.6* 10.2  HGB  11.0* 9.3*  HCT 34.9* 30.3*  MCV 107.1* 109.0*  PLT 318 774   Basic Metabolic Panel: Recent Labs  Lab 05/12/19 1941 05/13/19 0445  NA 136 130*  K 4.4 3.7  CL 103 103  CO2 21* 19*  GLUCOSE 114* 257*  BUN 7* 8  CREATININE 1.83* 1.97*  CALCIUM 8.8* 7.5*   GFR: Estimated Creatinine Clearance: 22.1 mL/min (A) (by C-G formula based on SCr of 1.97 mg/dL (H)). Liver Function Tests: Recent Labs  Lab 05/12/19 1941 05/13/19 0445  AST 19 17  ALT 12 9  ALKPHOS 88 70  BILITOT 0.7 0.8  PROT 6.3* 5.2*  ALBUMIN 3.1* 2.4*   Recent Labs  Lab 05/12/19 1941  LIPASE 91*   No results for input(s): AMMONIA in the last 168 hours. Coagulation Profile: No results for input(s): INR, PROTIME in the last 168 hours. Cardiac Enzymes: No results for input(s): CKTOTAL, CKMB, CKMBINDEX, TROPONINI in the last 168 hours. BNP (last 3 results) No results for input(s): PROBNP in the last 8760 hours. HbA1C: No results for input(s): HGBA1C in the last 72 hours. CBG: No results for input(s): GLUCAP in the last 168 hours. Lipid Profile: No results for input(s): CHOL, HDL, LDLCALC, TRIG, CHOLHDL, LDLDIRECT in the last 72 hours. Thyroid Function Tests: No results for input(s): TSH, T4TOTAL, FREET4, T3FREE, THYROIDAB in the last 72 hours. Anemia Panel: No results for input(s): VITAMINB12, FOLATE, FERRITIN, TIBC, IRON, RETICCTPCT in the last 72 hours. Sepsis Labs: No results for input(s): PROCALCITON, LATICACIDVEN in the last 168 hours.  Recent Results (from the past 240 hour(s))  SARS CORONAVIRUS 2 (TAT 6-24 HRS) Nasopharyngeal Nasopharyngeal Swab     Status: None   Collection Time: 05/12/19  9:30 PM   Specimen: Nasopharyngeal Swab  Result Value Ref Range Status   SARS Coronavirus 2 NEGATIVE NEGATIVE Final    Comment: (NOTE) SARS-CoV-2 target nucleic acids are NOT DETECTED. The SARS-CoV-2 RNA is generally detectable in upper and lower respiratory specimens during the acute phase of infection.  Negative results do not preclude SARS-CoV-2 infection, do not rule out co-infections with other pathogens, and should not be used as the sole basis for treatment or other patient management decisions. Negative results must be combined with clinical observations, patient history, and epidemiological information. The expected result is Negative. Fact Sheet for Patients: SugarRoll.be Fact Sheet for Healthcare Providers: https://www.woods-mathews.com/ This test is not yet approved or cleared by the [place] FDA and  has been authorized for detection and/or diagnosis of SARS-CoV-2 by FDA under an Emergency Use Authorization (EUA). This EUA will  remain  in effect (meaning this test can be used) for the duration of the COVID-19 declaration under Section 56 4(b)(1) of the Act, 21 U.S.C. section 360bbb-3(b)(1), unless the authorization is terminated or revoked sooner. Performed at [facility], [place] [address], [place], [place] [address]       Radiology Studies: Ct Abdomen Pelvis Wo Contrast  Result Date: 05/12/2019 CLINICAL DATA:  Abdominal pains, prior pancreatitis EXAM: CT ABDOMEN AND PELVIS WITHOUT CONTRAST TECHNIQUE: Multidetector CT imaging of the abdomen and pelvis was performed following the standard protocol without IV contrast. COMPARISON:  September 14, 2018 FINDINGS: Lower chest: The visualized heart size within normal limits. No pericardial fluid/thickening. No hiatal hernia. The visualized portions of the lungs are clear. Hepatobiliary: Although limited due to the lack of intravenous contrast, normal in appearance without gross focal abnormality. The patient is status post cholecystectomy. No biliary ductal dilation. Pancreas: The head of the pancreas appears to be enlarged with surrounding fat stranding changes seen at the pancreatic duodenal junction. There is mild dilatation of the first portion of the duodenum which is fluid-filled.  The fat stranding changes extend inferiorly adjacent to the inferior hepatic border. No definite loculated fluid collection is seen. Spleen: Normal in size. Although limited due to the lack of intravenous contrast, normal in appearance. Adrenals/Urinary Tract: Both adrenal glands appear normal. The kidneys and collecting system appear normal without evidence of urinary tract calculus or hydronephrosis. Bladder is unremarkable. Dense renal vascular calcifications are seen. Stomach/Bowel: The stomach, small bowel, and colon are normal in appearance. No inflammatory changes or obstructive findings. appendix is normal. Vascular/Lymphatic: There are no enlarged abdominal or pelvic lymph nodes. The patient is status post aorto bi iliac stent graft. Reproductive: The patient is status post hysterectomy. No adnexal masses or collections seen. Other: No evidence of abdominal wall mass or hernia. Musculoskeletal: No acute or significant osseous findings. IMPRESSION: Pancreatitis involving the pancreatic head and surrounding inflammatory changes involving the pancreatic duodenal junction. No definite loculated fluid collection. Electronically Signed   By: Prudencio Pair M.D.   On: 05/12/2019 21:25     Scheduled Meds:  amLODipine  5 mg Oral Daily   atorvastatin  40 mg Oral q1800   calcium carbonate  1 tablet Oral Daily   carvedilol  25 mg Oral BID   clopidogrel  75 mg Oral Q breakfast   diphenhydrAMINE  50 mg Oral QHS   gabapentin  300 mg Oral QHS   heparin  5,000 Units Subcutaneous Q8H   hydrALAZINE  25 mg Oral Q8H   pantoprazole (PROTONIX) IV  40 mg Intravenous QHS   traZODone  100 mg Oral QHS   Continuous Infusions:   LOS: 1 day    Time spent: 25 minutes spent in the coordination of care today.   Jonnie Finner, DO [facility] Pager [phone]  If 7PM-7AM, please contact night-coverage [email or web address] Password [email or web address] 05/13/2019, 3:35 PM

## 2019-05-13 NOTE — Plan of Care (Signed)
  Problem: Education: Goal: Knowledge of General Education information will improve Description Including pain rating scale, medication(s)/side effects and non-pharmacologic comfort measures Outcome: Progressing   

## 2019-05-14 MED ORDER — PRO-STAT SUGAR FREE PO LIQD
30.0000 mL | Freq: Two times a day (BID) | ORAL | Status: DC
  Administered 2019-05-15 – 2019-05-18 (×4): 30 mL via ORAL
  Filled 2019-05-14 (×6): qty 30

## 2019-05-14 MED ORDER — CHLORHEXIDINE GLUCONATE CLOTH 2 % EX PADS: 6.0000 | MEDICATED_PAD | Freq: Every day | CUTANEOUS | Status: DC

## 2019-05-14 MED ORDER — RENA-VITE PO TABS
1.0000 | ORAL_TABLET | Freq: Every day | ORAL | Status: DC
  Administered 2019-05-14 – 2019-05-17 (×4): 1 via ORAL
  Filled 2019-05-14 (×4): qty 1

## 2019-05-14 MED ORDER — SODIUM CHLORIDE 0.9 % IV SOLN
INTRAVENOUS | Status: DC
  Administered 2019-05-14 (×2): via INTRAVENOUS

## 2019-05-14 MED ORDER — NICOTINE 21 MG/24HR TD PT24
21.0000 mg | MEDICATED_PATCH | Freq: Every day | TRANSDERMAL | Status: DC
  Administered 2019-05-14 – 2019-05-18 (×5): 21 mg via TRANSDERMAL
  Filled 2019-05-14 (×5): qty 1

## 2019-05-14 MED ORDER — BOOST / RESOURCE BREEZE PO LIQD CUSTOM
1.0000 | Freq: Three times a day (TID) | ORAL | Status: DC
  Administered 2019-05-14 – 2019-05-18 (×10): 1 via ORAL

## 2019-05-14 MED ORDER — DOXERCALCIFEROL 4 MCG/2ML IV SOLN
1.0000 ug | INTRAVENOUS | Status: DC
  Administered 2019-05-15 – 2019-05-17 (×2): 1 ug via INTRAVENOUS
  Filled 2019-05-14 (×2): qty 2

## 2019-05-14 NOTE — Progress Notes (Signed)
PROGRESS NOTE    CHICQUITA [name]  HRC:[id number] DOB: 23-Jan-1951 DOA: 05/12/2019 PCP: Bernerd Limbo, MD    Brief Narrative:   [name] is a 68 y.o. female with medical history significant for CAD s/p CABG and DES, s/p mitral valve repair, ESRD on TTS HD, atrial fibrillation s/p Maze procedure 2012 not on anticoagulation, chronic diastolic CHF (EF 68-03%, O1YY by TTE 05/2018), PAD s/p b/l renal artery stents and left subclavian stent, AAA s/p endovascular repair 2019, chronic pancreatitis, COPD on 2 L supplemental O2 via [place] at night, HTN, HLD, RBBB, and depression who presents to the ED for epigastric pain associated with nausea and vomiting.  Patient has noted complicated pancreatic history since May 2019 with recurrent issues of pancreatitis.  Initial episode was felt due to gallstones and she underwent cholecystectomy but has had recurrent episodes since then.  She has had extensive prior work-up including EGD and EUS which was reportedly consistent with chronic pancreatitis with calcifications.  She had unremarkable ANA and IgG4 levels.  She had MRCP MRI which was unremarkable.  She was recently admitted at [facility] from 04/27/2019-04/30/2019 for another episode of acute on chronic pancreatitis.  She was treated with IVF, pain medications and bowel rest.  She has been on chronic pain management with oral Dilaudid.  She has also been taking Creon however feels that since the dose was increased she has had been having more frequent attacks.  Patient states she has been doing fairly well since discharge from [facility].  On the night of 05/11/2019 she had sudden onset epigastric and right upper quadrant pain which woke her from sleep.  She had adiation of the pain to her back.  She has had associated nausea with vomiting and inability to maintain adequate oral intake or her home medications.  She reports diaphoresis without subjective fevers or chills.  She reports  good urine output without dysuria.  She denies any diarrhea or constipation.  She denies any chest pain or dyspnea.  ED Course:  Initial vitals showed BP 178/73, pulse 78, RR 13, temp 99.0 Fahrenheit, SPO2 99% on room air. Labs notable for WBC 12.6, hemoglobin 11.0, MCV 107.1, platelets 318,000, potassium 4.4, BUN 7, creatinine 1.83, serum glucose 114, LFTs within normal limits, lipase 91, high-sensitivity troponin I 27. CT abdomen/pelvis without contrast showed changes suggestive of pancreatitis involving the pancreatic head and surrounding inflammatory changes involving the pancreatic duodenal junction.  No definite loculated fluid collection was noted.  Patient was given IV Dilaudid and antiemetics, 1.5 L normal saline with continued pain, nausea, and vomiting.  The hospitalist service was consulted to admit for further evaluation and management.   Assessment & Plan:   Principal Problem:   Acute on chronic pancreatitis (HCC) Active Problems:   Essential hypertension   Chronic diastolic heart failure (HCC)   COPD (chronic obstructive pulmonary disease) (HCC)   PAD (peripheral artery disease) (HCC)   Hyperlipidemia   CAD (coronary artery disease)   ESRD (end stage renal disease) (HCC)   Recurrent acute on chronic pancreatitis: Initially felt secondary to gallstone pancreatitis however has had multiple episodes since cholecystectomy.  Extensive prior work-up so far unrevealing but suggestive of chronic pancreatitis. CT again shows changes suggestive of pancreatitis involving the pancreatic head and pancreatic duodenal junction.  Lipase 91. --Continue IV fluids with normal saline at 75 mL's per hour --Clear liquid diet, advance as tolerated --Continue pain control with IV Dilaudid 2 mg q2h prn severe pain  --  Supportive care, antiemetics --Repeat lipase in the a.m. --Patient needs to consider pain management outpatient with consideration of celiac plexus block for recurrent  pancreatitis  ESRD on TTS HD: Completed full regular session on 05/12/2019.  Follows with [facility].  --Nephology consulted to continue HD while inpatient  GERD: Reports worsening indigestion/heartburn since not able to take oral Protonix.   --continue Protonix 40 mg IV daily.  CAD s/p CABG/DES: PAD: Chronic and stable.  Continue carvedilol, Plavix, and atorvastatin.  Atrial fibrillation post maze procedure: Off of anticoagulation since maze procedure in 2012.  Remains in sinus rhythm.  COPD: Continue as needed albuterol and supplemental O2 at night.  Hypertension:  Continue home amlodipine, carvedilol, hydralazine.  Hyperlipidemia: Continue atorvastatin.  Chronic diastolic CHF: EF 66-59%, D3TT by TTE 05/2018.  Volume control with dialysis.   --Continue strict I's and O's and daily weights  Depression: Continue trazodone at night.   DVT prophylaxis: Heparin Code Status: Full code Family Communication: None Disposition Plan: Continue inpatient hospitalization, IV fluid hydration, IV pain control, further dependent on clinical course   Consultants:   Nephrology, Dr. Posey Pronto  Procedures:   None  Antimicrobials:   None   Subjective: Patient seen and examined at bedside, resting comfortably.  States abdominal pain is slowly improving but continues with persistent epigastric pain with radiation towards her back.  Currently tolerating with diet, clear liquids with some mild nausea.  Used 8 doses of IV Dilaudid over the past 24 hours.  No other complaints or concerns at this time.  Denies headache, no fever/chills/night sweats, no vomiting/diarrhea, no chest pain, no palpitations, no cough/congestion, no weakness, no paresthesias.  No acute events overnight per nursing staff.  Objective: Vitals:   05/13/19 1858 05/13/19 2108 05/14/19 0456 05/14/19 0908  BP: (!) 148/63 (!) 149/58 (!) 145/46 (!) 114/57  Pulse: 63 63 66 75  Resp:  18 16 20   Temp:  99.2 F (37.3  C) (!) 100.7 F (38.2 C)   TempSrc:  Oral Oral   SpO2: 97% 100% 90% 98%  Weight:  49.1 kg    Height:        Intake/Output Summary (Last 24 hours) at 05/14/2019 1221 Last data filed at 05/14/2019 0824 Gross per 24 hour  Intake 710.7 ml  Output 0 ml  Net 710.7 ml   Filed Weights   05/12/19 1937 05/13/19 2108  Weight: 49.9 kg 49.1 kg    Examination:  General exam: Appears calm and comfortable  Respiratory system: Clear to auscultation. Respiratory effort normal. Cardiovascular system: S1 & S2 heard, RRR. No JVD, murmurs, rubs, gallops or clicks. No pedal edema. Gastrointestinal system: Abdomen is nondistended, soft and mild epigastric tenderness, No organomegaly or masses felt. Normal bowel sounds heard. Central nervous system: Alert and oriented. No focal neurological deficits. Extremities: Symmetric 5 x 5 power. Skin: No rashes, lesions or ulcers Psychiatry: Judgement and insight appear normal. Mood & affect appropriate.     Data Reviewed: I have personally reviewed following labs and imaging studies  CBC: Recent Labs  Lab 05/12/19 1941 05/13/19 0445  WBC 12.6* 10.2  HGB 11.0* 9.3*  HCT 34.9* 30.3*  MCV 107.1* 109.0*  PLT 318 017   Basic Metabolic Panel: Recent Labs  Lab 05/12/19 1941 05/13/19 0445  NA 136 130*  K 4.4 3.7  CL 103 103  CO2 21* 19*  GLUCOSE 114* 257*  BUN 7* 8  CREATININE 1.83* 1.97*  CALCIUM 8.8* 7.5*   GFR: Estimated Creatinine Clearance: 21.8 mL/min (A) (  by C-G formula based on SCr of 1.97 mg/dL (H)). Liver Function Tests: Recent Labs  Lab 05/12/19 1941 05/13/19 0445  AST 19 17  ALT 12 9  ALKPHOS 88 70  BILITOT 0.7 0.8  PROT 6.3* 5.2*  ALBUMIN 3.1* 2.4*   Recent Labs  Lab 05/12/19 1941  LIPASE 91*   No results for input(s): AMMONIA in the last 168 hours. Coagulation Profile: No results for input(s): INR, PROTIME in the last 168 hours. Cardiac Enzymes: No results for input(s): CKTOTAL, CKMB, CKMBINDEX, TROPONINI in the  last 168 hours. BNP (last 3 results) No results for input(s): PROBNP in the last 8760 hours. HbA1C: No results for input(s): HGBA1C in the last 72 hours. CBG: No results for input(s): GLUCAP in the last 168 hours. Lipid Profile: No results for input(s): CHOL, HDL, LDLCALC, TRIG, CHOLHDL, LDLDIRECT in the last 72 hours. Thyroid Function Tests: No results for input(s): TSH, T4TOTAL, FREET4, T3FREE, THYROIDAB in the last 72 hours. Anemia Panel: No results for input(s): VITAMINB12, FOLATE, FERRITIN, TIBC, IRON, RETICCTPCT in the last 72 hours. Sepsis Labs: No results for input(s): PROCALCITON, LATICACIDVEN in the last 168 hours.  Recent Results (from the past 240 hour(s))  SARS CORONAVIRUS 2 (TAT 6-24 HRS) Nasopharyngeal Nasopharyngeal Swab     Status: None   Collection Time: 05/12/19  9:30 PM   Specimen: Nasopharyngeal Swab  Result Value Ref Range Status   SARS Coronavirus 2 NEGATIVE NEGATIVE Final    Comment: (NOTE) SARS-CoV-2 target nucleic acids are NOT DETECTED. The SARS-CoV-2 RNA is generally detectable in upper and lower respiratory specimens during the acute phase of infection. Negative results do not preclude SARS-CoV-2 infection, do not rule out co-infections with other pathogens, and should not be used as the sole basis for treatment or other patient management decisions. Negative results must be combined with clinical observations, patient history, and epidemiological information. The expected result is Negative. Fact Sheet for Patients: SugarRoll.be Fact Sheet for Healthcare Providers: https://www.woods-mathews.com/ This test is not yet approved or cleared by the [place] FDA and  has been authorized for detection and/or diagnosis of SARS-CoV-2 by FDA under an Emergency Use Authorization (EUA). This EUA will remain  in effect (meaning this test can be used) for the duration of the COVID-19 declaration under Section  56 4(b)(1) of the Act, 21 U.S.C. section 360bbb-3(b)(1), unless the authorization is terminated or revoked sooner. Performed at [facility], [place] [address] [place], [place] [address]   MRSA PCR Screening     Status: None   Collection Time: 05/13/19  3:28 PM   Specimen: Nasopharyngeal  Result Value Ref Range Status   MRSA by PCR NEGATIVE NEGATIVE Final    Comment:        The GeneXpert MRSA Assay (FDA approved for NASAL specimens only), is one component of a comprehensive MRSA colonization surveillance program. It is not intended to diagnose MRSA infection nor to guide or monitor treatment for MRSA infections. Performed at [facility], [place] [address] [place],  [address]          Radiology Studies: Ct Abdomen Pelvis Wo Contrast  Result Date: 05/12/2019 CLINICAL DATA:  Abdominal pains, prior pancreatitis EXAM: CT ABDOMEN AND PELVIS WITHOUT CONTRAST TECHNIQUE: Multidetector CT imaging of the abdomen and pelvis was performed following the standard protocol without IV contrast. COMPARISON:  September 14, 2018 FINDINGS: Lower chest: The visualized heart size within normal limits. No pericardial fluid/thickening. No hiatal hernia. The visualized portions of the lungs  are clear. Hepatobiliary: Although limited due to the lack of intravenous contrast, normal in appearance without gross focal abnormality. The patient is status post cholecystectomy. No biliary ductal dilation. Pancreas: The head of the pancreas appears to be enlarged with surrounding fat stranding changes seen at the pancreatic duodenal junction. There is mild dilatation of the first portion of the duodenum which is fluid-filled. The fat stranding changes extend inferiorly adjacent to the inferior hepatic border. No definite loculated fluid collection is seen. Spleen: Normal in size. Although limited due to the lack of intravenous contrast, normal in appearance. Adrenals/Urinary Tract: Both adrenal glands  appear normal. The kidneys and collecting system appear normal without evidence of urinary tract calculus or hydronephrosis. Bladder is unremarkable. Dense renal vascular calcifications are seen. Stomach/Bowel: The stomach, small bowel, and colon are normal in appearance. No inflammatory changes or obstructive findings. appendix is normal. Vascular/Lymphatic: There are no enlarged abdominal or pelvic lymph nodes. The patient is status post aorto bi iliac stent graft. Reproductive: The patient is status post hysterectomy. No adnexal masses or collections seen. Other: No evidence of abdominal wall mass or hernia. Musculoskeletal: No acute or significant osseous findings. IMPRESSION: Pancreatitis involving the pancreatic head and surrounding inflammatory changes involving the pancreatic duodenal junction. No definite loculated fluid collection. Electronically Signed   By: Prudencio Pair M.D.   On: 05/12/2019 21:25        Scheduled Meds:  amLODipine  5 mg Oral Daily   atorvastatin  40 mg Oral q1800   calcium carbonate  1 tablet Oral Daily   carvedilol  25 mg Oral BID   clopidogrel  75 mg Oral Q breakfast   diphenhydrAMINE  50 mg Oral QHS   gabapentin  300 mg Oral QHS   heparin  5,000 Units Subcutaneous Q8H   hydrALAZINE  25 mg Oral Q8H   lidocaine  1 patch Transdermal Q24H   pantoprazole (PROTONIX) IV  40 mg Intravenous QHS   promethazine  12.5 mg Intravenous Once   traZODone  100 mg Oral QHS   Continuous Infusions:  sodium chloride 75 mL/hr at 05/14/19 1009     LOS: 2 days    Time spent: 36 minutes spent on chart review, discussion with nursing staff, consultants, updating family and interview/physical exam; more than 50% of that time was spent in counseling and/or coordination of care.    Sadako Cegielski J [place] DO [facility] Pager [phone]  If 7PM-7AM, please contact night-coverage [email or web address] Password [email or web address] 05/14/2019, 12:21 PM

## 2019-05-14 NOTE — Progress Notes (Addendum)
Initial Nutrition Assessment  DOCUMENTATION CODES:   Underweight  INTERVENTION:   -Boost Breeze po TID, each supplement provides 250 kcal and 9 grams of protein -Prostat liquid protein PO 30 ml BID with meals, each supplement provides 100 kcal, 15 grams protein.  NUTRITION DIAGNOSIS:   Increased nutrient needs related to (acute on chronic pancreatitis) as evidenced by estimated needs.  GOAL:   Patient will meet greater than or equal to 90% of their needs  MONITOR:   PO intake, Supplement acceptance, Labs, Weight trends, I & O's, Diet advancement  REASON FOR ASSESSMENT:   Malnutrition Screening Tool    ASSESSMENT:   68 y.o. female with medical history significant for CAD s/p CABG and DES, s/p mitral valve repair, ESRD on TTS HD, atrial fibrillation s/p Maze procedure 2012 not on anticoagulation, chronic diastolic CHF (EF 24-23%, N3IR by TTE 05/2018), PAD s/p b/l renal artery stents and left subclavian stent, AAA s/p endovascular repair 2019, chronic pancreatitis, COPD on 2 L supplemental O2 via [place] at night, HTN, HLD, RBBB, and depression who presents to the ED for epigastric pain associated with nausea and vomiting.  **RD working remotely**  Pt with ESRD on HD (typical schedule TTS). Last HD was 9/5. EDW: 51.5 kg -per nephrology note  Patient currently on clears, consumed 100% of her clears tray this morning. Pt continues to have abdominal pain which started 9/4 following discharge from [place].  Will order Boost Breeze and Prostat supplements for additional kcal and protein.  Per weight records, pt has lost 26 lbs since 07/05/18 (19% wt loss x 10 months, significant for time frame).  I/Os: +710 ml since admit  Medications: calcium carbonate daily, Zofran PRN Labs reviewed: Low Na GFR:26  NUTRITION - FOCUSED PHYSICAL EXAM:  Unable to perform -working remotely.  Diet Order:   Diet Order            Diet clear liquid Room service appropriate? Yes; Fluid consistency:  Thin  Diet effective now              EDUCATION NEEDS:   No education needs have been identified at this time  Skin:  Skin Assessment: Reviewed RN Assessment  Last BM:  9/5  Height:   Ht Readings from Last 1 Encounters:  05/12/19 5\' 5"  (1.651 m)    Weight:   Wt Readings from Last 1 Encounters:  05/13/19 49.1 kg    Ideal Body Weight:  56.8 kg  BMI:  Body mass index is 18.01 kg/m.  Estimated Nutritional Needs:   Kcal:  1700-1900  Protein:  85-100g  Fluid:  Per MD  Clayton Bibles, MS, RD, LDN Inpatient Clinical Dietitian Pager: [phone] After Hours Pager: [phone]

## 2019-05-14 NOTE — Plan of Care (Signed)
  Problem: Pain Managment: Goal: General experience of comfort will improve Outcome: Progressing   

## 2019-05-14 NOTE — Consult Note (Addendum)
[facility] Renal Consultation Note    Indication for Consultation:  Management of ESRD/hemodialysis; anemia, hypertension/volume and secondary hyperparathyroidism PCP:  HPI: [name] is a 68 y.o. female with ESRD on TTS HD at [facility], hx DM, HTN, CAD, COPD, CABG and stents 2016, left CEA, aortic iliac stent graft for AAA, bilateral renal artery stents, hx MAze, GIB, depression, PAF, bowel resection, recurrent pancreatitis with pseudocyst, s/p cholecystectomy who presented with epigastric pain and N, V.  She has had extensive work up in the past a recent admission at [facility] (current GI MDs are with [facility] because local GI did not accept her insurance).  Her [facility] physicians have tripled her Creon dose to 72,000 U which she feels may be too high a dose and his worsening her symptoms.. She denies eating foods with fats/oils but her daughter whom she lives with disagrees. She has progressively been losing weight and presented below her EDW Saturday.  Post HD weight Saturday was 49.8 with neg UF 0.5 L (EDW 51.5).  She is having diarrhea today and is without any urinary symptoms.  Abdominal pain is more in the RUQ and radiates to the back.  Pertinent labs: NA 130 glu 257 K 3.7 Cr 1.97 BUN 8 alb 2.4 LFT ok hgb 9.3 WBC 10.2 plts 267 lipase 91, trop 27, COVID neg. Abd CT confirmed pancreatitis involving the pancreatic head and surrounding inflammatory changes involving the pancreatic duodenal junction without definite loculated fluid collection.  Past Medical History:  Diagnosis Date  . AAA (abdominal aortic aneurysm) ([place]) 5/09   3.7x3.3 by u/s 2009  . Abnormal EKG    deep TW inversions chronic  . Anemia   . CAD (coronary artery disease)    a. CABG 03/2011 LIMA to LAD, SVG to OM, SVG to PDA. b. cath 06/25/2015 DES to SVG to rPDA, patent LIMA to LAD, patent SVG to OM  . carotid stenosis 5/09   S/p L CEA ;  60-79% bilat ICA by preCABG dopplers 7/12  . CHF (congestive heart failure) ([place])    . Chronic back pain    "all over" (06/24/2015)  . Chronic bronchitis ([place])    "get it pretty much q yr" (06/24/2015)  . CKD (chronic kidney disease)   . Complication of anesthesia 1966   "problem w/ether"  . COPD (chronic obstructive pulmonary disease) ([place])   . Depression   . GERD (gastroesophageal reflux disease)    With hiatal hernia  . GIB (gastrointestinal bleeding) 9/11   S/p EGD with cautery at [facility]  . Heart murmur   . History of blood transfusion "a few times"   "all related to anemia" (06/24/2015)  . History of hiatal hernia   . Hyperlipidemia   . Hypertension   . Mitral regurgitation    3+ MR by intraoperative TEE;  s/p MV repair with Dr. Roxy Manns 7/12  . Myocardial infarction [facility]) 2012 "several"  . On home oxygen therapy    "2 liters at night; negative for sleep apnea"  . PAD (peripheral artery disease) (H[facility])    Severe; s/p bilateral renal artery stents, moderate in-stent restenosis  . Pancreatitis 05/2018  . Paroxysmal atrial fibrillation (H[facility])    coumadin;  echo 9/07: EF 60%, mild LVH;  s/p Cox Maze 7/12 with LAA clipping  . Subclavian artery stenosis, left (H[facility])    stented by Dr. Trula Slade on 10/18 to help flow of her LIMA to LAD  . Type II diabetes mellitus ([place])    Past Surgical History:  Procedure Laterality Date  . A/V FISTULAGRAM Left 03/21/2018   Procedure: A/V FISTULAGRAM;  Surgeon: Serafina Mitchell, MD;  Location: [place] [facility];  Service: Cardiovascular;  Laterality: Left;  . ABDOMINAL AORTIC ENDOVASCULAR STENT GRAFT N/A 01/17/2018   Procedure: ABDOMINAL AORTIC ENDOVASCULAR STENT GRAFT WITH CO2;  Surgeon: Serafina Mitchell, MD;  Location: [place];  Service: Vascular;  Laterality: N/A;  . ABDOMINAL HYSTERECTOMY  1990's  . APPENDECTOMY  Aug. 11, 2016   Ruptured  . AV FISTULA PLACEMENT Left 11/03/2017   Procedure: ARTERIOVENOUS (AV) FISTULA CREATION LEFT ARM;  Surgeon: Serafina Mitchell, MD;  Location: [place];  Service: Vascular;  Laterality: Left;  . BOWEL  RESECTION  2013  . CARDIAC CATHETERIZATION N/A 06/24/2015   Procedure: Left Heart Cath and Cors/Grafts Angiography;  Surgeon: Leonie Man, MD;  Location: [place] [facility];  Service: Cardiovascular;  Laterality: N/A;  . CARDIAC CATHETERIZATION N/A 06/25/2015   Procedure: Coronary Stent Intervention;  Surgeon: Sherren Mocha, MD;  Location: [place] [facility];  Service: Cardiovascular;  Laterality: N/A;  . CAROTID ENDARTERECTOMY Left   . CATARACT EXTRACTION Right 03/27/2018  . CHOLECYSTECTOMY N/A 04/19/2018   Procedure: LAPAROSCOPIC CHOLECYSTECTOMY WITH INTRAOPERATIVE CHOLANGIOGRAM;  Surgeon: Rolm Bookbinder, MD;  Location: [place];  Service: General;  Laterality: N/A;  . CORONARY ANGIOPLASTY    . CORONARY ARTERY BYPASS GRAFT  03/05/2011   CABG X3 (LIMA to LAD, SVG to OM, SVG to PDA, EVH via left thigh  . CORONARY STENT PLACEMENT    . ESOPHAGOGASTRODUODENOSCOPY N/A 04/12/2018   Procedure: ESOPHAGOGASTRODUODENOSCOPY (EGD);  Surgeon: [name], MD;  Location: [facility] ENDOSCOPY;  Service: Endoscopy;  Laterality: N/A;  . ESOPHAGOGASTRODUODENOSCOPY N/A 06/16/2018   Procedure: ESOPHAGOGASTRODUODENOSCOPY (EGD);  Surgeon: Thornton Park, MD;  Location: [facility];  Service: Gastroenterology;  Laterality: N/A;  . LACERATION REPAIR Right 1990's   WRIST  . MAZE  03/05/2011   complete biatrial lesion set with clipping of LA appendage  . MITRAL VALVE REPAIR  03/05/2011   80mm Memo 3D ring annuloplasty for ischemic MR  . PERIPHERAL VASCULAR BALLOON ANGIOPLASTY Left 03/21/2018   Procedure: PERIPHERAL VASCULAR BALLOON ANGIOPLASTY;  Surgeon: Serafina Mitchell, MD;  Location: [place] [facility];  Service: Cardiovascular;  Laterality: Left;  . PERIPHERAL VASCULAR CATHETERIZATION N/A 06/24/2015   Procedure: Aortic Arch Angiography;  Surgeon: Serafina Mitchell, MD;  Location: [place] [facility];  Service: Cardiovascular;  Laterality: N/A;  . PERIPHERAL VASCULAR CATHETERIZATION  06/24/2015   Procedure:  Peripheral Vascular Intervention;  Surgeon: Serafina Mitchell, MD;  Location: [place] [facility];  Service: Cardiovascular;;  . PERIPHERAL VASCULAR CATHETERIZATION N/A 06/24/2015   Procedure: Abdominal Aortogram;  Surgeon: Serafina Mitchell, MD;  Location: [place] [facility];  Service: Cardiovascular;  Laterality: N/A;  . RENAL ANGIOGRAM Bilateral 09/11/2013   Procedure: RENAL ANGIOGRAM;  Surgeon: Serafina Mitchell, MD;  Location: [facility] CATH LAB;  Service: Cardiovascular;  Laterality: Bilateral;  . RIGHT FEMORAL-POPLITEAL BYPASS     Family History  Problem Relation Age of Onset  . Emphysema Mother   . Heart disease Mother        before age 72  . Hypertension Mother   . Hyperlipidemia Mother   . Heart attack Mother   . AAA (abdominal aortic aneurysm) Mother        rupture  . Heart attack Father 75  . Emphysema Father   . Heart disease Father        before age 34  .  Hyperlipidemia Father   . Hypertension Father   . Peripheral vascular disease Father   . Diabetes Brother   . Heart disease Brother        before age 40  . Hyperlipidemia Brother   . Hypertension Brother   . Heart attack Brother        CABG  . Heart disease Other        Vascular disease in grandparents, uncles and dad  . Colon cancer Neg Hx   . Esophageal cancer Neg Hx   . Inflammatory bowel disease Neg Hx   . Liver disease Neg Hx   . Pancreatic cancer Neg Hx   . Rectal cancer Neg Hx   . Stomach cancer Neg Hx    Social History:  reports that she has been smoking cigarettes. She has smoked for the past 50.00 years. She has never used smokeless tobacco. She reports current alcohol use. She reports current drug use. Drug: Marijuana. Allergies  Allergen Reactions  . Isosorbide Other (See Comments)    Can only tolerate in low doses (unknown reaction) Other reaction(s): Other Unknown "FELT LIKE I WAS GOING TO EXPLODE FROM INSIDE OUT"  . Isosorbide Nitrate Other (See Comments)    Can only tolerate in low doses (unknown  reaction)  . Oxycodone-Acetaminophen Itching    Tolerates Hydrocodone.  Also tolerated oxycodone 07/13/18 admission.    . Codeine Itching  . Contrast Media [Iodinated Diagnostic Agents] Itching and Other (See Comments)    Itching of feet  . Ioxaglate Itching and Other (See Comments)    Itching of feet  . Metrizamide Itching and Other (See Comments)    Itching of feet  . Percocet [Oxycodone-Acetaminophen] Itching and Other (See Comments)    Tolerates Hydrocodone   Prior to Admission medications   Medication Sig Start Date End Date Taking? Authorizing Provider  acetaminophen (TYLENOL) 325 MG tablet Take 325 mg by mouth every 6 (six) hours as needed (for pain).   Yes [provider]  Albuterol Sulfate (PROAIR RESPICLICK) 093 (90 Base) MCG/ACT AEPB Inhale 1 puff into the lungs every 4 (four) hours as needed (shortness of breath/wheezing).   Yes [provider]  allopurinol (ZYLOPRIM) 100 MG tablet Take 100 mg by mouth daily. 02/02/17  Yes [provider]  Amino Acids-Protein Hydrolys (FEEDING SUPPLEMENT, PRO-STAT SUGAR FREE 64,) LIQD Take 30 mLs by mouth 3 (three) times daily with meals. 06/26/18  Yes Thurnell Lose, MD  amLODipine (NORVASC) 5 MG tablet Take 1 tablet (5 mg total) by mouth daily. 08/23/18  Yes Mikhail, Maryann, DO  atorvastatin (LIPITOR) 40 MG tablet Take 40 mg by mouth daily.  12/07/17  Yes [provider]  calcium carbonate (TUMS EX) 750 MG chewable tablet Chew 2 tablets by mouth 3 (three) times daily as needed for heartburn.    Yes [provider]  carvedilol (COREG) 25 MG tablet Take 25 mg by mouth 2 (two) times daily. 04/16/19  Yes [provider]  clopidogrel (PLAVIX) 75 MG tablet Take 1 tablet (75 mg total) by mouth daily with breakfast. 06/26/15  Yes Almyra Deforest, PA  CREON 12000 units CPEP capsule Take 36,000 Units by mouth 2 (two) times daily with a meal. 04/24/19  Yes [provider]  diphenhydrAMINE (BENADRYL)  25 MG tablet Take 50 mg by mouth at bedtime.   Yes [provider]  feeding supplement (BOOST / RESOURCE BREEZE) LIQD Take 1 Container by mouth 2 (two) times daily.   Yes [provider]  fenofibrate 160 MG tablet Take 1 tablet (160 mg total) by mouth daily. 10/08/15  Yes Delfina Redwood, MD  gabapentin (NEURONTIN) 300 MG capsule Take 1 capsule (300 mg total) by mouth at bedtime. 08/22/18  Yes Mikhail, Velta Addison, DO  hydrALAZINE (APRESOLINE) 25 MG tablet Take 1 tablet (25 mg total) by mouth every 8 (eight) hours. 06/26/18  Yes Thurnell Lose, MD  HYDROmorphone (DILAUDID) 2 MG tablet Take 2 mg by mouth every 4 (four) hours.    Yes [provider]  loperamide (IMODIUM A-D) 2 MG tablet Take 2 mg by mouth 4 (four) times daily as needed for diarrhea or loose stools.   Yes [provider]  OXYGEN Inhale into the lungs at bedtime.   Yes [provider]  pantoprazole (PROTONIX) 40 MG tablet Take 1 tablet (40 mg total) by mouth daily. 08/03/18  Yes Patrecia Pour, MD  polyethylene glycol (MIRALAX / GLYCOLAX) packet Take 17 g by mouth daily as needed for mild constipation. 06/26/18  Yes Thurnell Lose, MD  promethazine (PHENERGAN) 12.5 MG tablet Take 12.5 mg by mouth every 4 (four) hours as needed for nausea or vomiting.   Yes [provider]  traZODone (DESYREL) 100 MG tablet Take 100 mg by mouth at bedtime. 04/19/19  Yes [provider]  ciprofloxacin (CIPRO) 250 MG tablet Take 1 tablet (250 mg total) by mouth daily with breakfast. Patient not taking: Reported on 05/12/2019 09/28/18   Wardell Honour, MD  PROLENSA 0.07 % SOLN Place 2 drops into the left eye at bedtime. 04/17/19   [provider]   Current Facility-Administered Medications  Medication Dose Route Frequency Provider Last Rate Last Dose  . 0.9 %  sodium chloride infusion   Intravenous Continuous [place] Eric J, DO 75 mL/hr at 05/14/19 1009    . acetaminophen (TYLENOL)  tablet 650 mg  650 mg Oral Q6H PRN Lenore Cordia, MD   650 mg at 05/14/19 0534   Or  . acetaminophen (TYLENOL) suppository 650 mg  650 mg Rectal Q6H PRN Zada Finders R, MD      . albuterol (PROVENTIL) (2.5 MG/3ML) 0.083% nebulizer solution 2.5 mg  2.5 mg Nebulization Q4H PRN Zada Finders R, MD      . amLODipine (NORVASC) tablet 5 mg  5 mg Oral Daily Zada Finders R, MD   5 mg at 05/14/19 1006  . atorvastatin (LIPITOR) tablet 40 mg  40 mg Oral q1800 Lenore Cordia, MD   40 mg at 05/13/19 1730  . calcium carbonate (TUMS - dosed in mg elemental calcium) chewable tablet 200 mg of elemental calcium  1 tablet Oral Daily Zada Finders R, MD   200 mg of elemental calcium at 05/14/19 1006  . carvedilol (COREG) tablet 25 mg  25 mg Oral BID Lenore Cordia, MD   25 mg at 05/14/19 1006  . clopidogrel (PLAVIX) tablet 75 mg  75 mg Oral Q breakfast Zada Finders R, MD      . diphenhydrAMINE (BENADRYL) capsule 50 mg  50 mg Oral QHS Lenore Cordia, MD   50 mg at 05/13/19 2243  . gabapentin (NEURONTIN) capsule 300 mg  300 mg Oral QHS Zada Finders R, MD   300 mg at 05/13/19 2243  . heparin injection 5,000 Units  5,000 Units Subcutaneous Q8H Zada Finders R, MD   5,000 Units at 05/14/19 1313  . hydrALAZINE (APRESOLINE) tablet 25 mg  25 mg Oral Q8H Lenore Cordia, MD  25 mg at 05/14/19 1315  . HYDROmorphone (DILAUDID) injection 2 mg  2 mg Intravenous Q2H PRN Marylyn Ishihara, Tyrone A, DO   2 mg at 05/14/19 1038  . lidocaine (LIDODERM) 5 % 1 patch  1 patch Transdermal Q24H Kyle, Tyrone A, DO   1 patch at 05/13/19 1728  . ondansetron (ZOFRAN) tablet 4 mg  4 mg Oral Q6H PRN Lenore Cordia, MD       Or  . ondansetron (ZOFRAN) injection 4 mg  4 mg Intravenous Q6H PRN Lenore Cordia, MD   4 mg at 05/14/19 1308  . pantoprazole (PROTONIX) injection 40 mg  40 mg Intravenous QHS Lenore Cordia, MD   40 mg at 05/13/19 2157  . promethazine (PHENERGAN) injection 12.5 mg  12.5 mg Intravenous Once Lovey Newcomer T, NP      .  traZODone (DESYREL) tablet 100 mg  100 mg Oral QHS Lenore Cordia, MD   100 mg at 05/13/19 2243   Labs: Basic Metabolic Panel: Recent Labs  Lab 05/12/19 1941 05/13/19 0445  NA 136 130*  K 4.4 3.7  CL 103 103  CO2 21* 19*  GLUCOSE 114* 257*  BUN 7* 8  CREATININE 1.83* 1.97*  CALCIUM 8.8* 7.5*   Liver Function Tests: Recent Labs  Lab 05/12/19 1941 05/13/19 0445  AST 19 17  ALT 12 9  ALKPHOS 88 70  BILITOT 0.7 0.8  PROT 6.3* 5.2*  ALBUMIN 3.1* 2.4*   Recent Labs  Lab 05/12/19 1941  LIPASE 91*   No results for input(s): AMMONIA in the last 168 hours. CBC: Recent Labs  Lab 05/12/19 1941 05/13/19 0445  WBC 12.6* 10.2  HGB 11.0* 9.3*  HCT 34.9* 30.3*  MCV 107.1* 109.0*  PLT 318 267   Cardiac Enzymes: No results for input(s): CKTOTAL, CKMB, CKMBINDEX, TROPONINI in the last 168 hours. CBG: No results for input(s): GLUCAP in the last 168 hours. Iron Studies: No results for input(s): IRON, TIBC, TRANSFERRIN, FERRITIN in the last 72 hours. Studies/Results: Ct Abdomen Pelvis Wo Contrast  Result Date: 05/12/2019 CLINICAL DATA:  Abdominal pains, prior pancreatitis EXAM: CT ABDOMEN AND PELVIS WITHOUT CONTRAST TECHNIQUE: Multidetector CT imaging of the abdomen and pelvis was performed following the standard protocol without IV contrast. COMPARISON:  September 14, 2018 FINDINGS: Lower chest: The visualized heart size within normal limits. No pericardial fluid/thickening. No hiatal hernia. The visualized portions of the lungs are clear. Hepatobiliary: Although limited due to the lack of intravenous contrast, normal in appearance without gross focal abnormality. The patient is status post cholecystectomy. No biliary ductal dilation. Pancreas: The head of the pancreas appears to be enlarged with surrounding fat stranding changes seen at the pancreatic duodenal junction. There is mild dilatation of the first portion of the duodenum which is fluid-filled. The fat stranding changes extend  inferiorly adjacent to the inferior hepatic border. No definite loculated fluid collection is seen. Spleen: Normal in size. Although limited due to the lack of intravenous contrast, normal in appearance. Adrenals/Urinary Tract: Both adrenal glands appear normal. The kidneys and collecting system appear normal without evidence of urinary tract calculus or hydronephrosis. Bladder is unremarkable. Dense renal vascular calcifications are seen. Stomach/Bowel: The stomach, small bowel, and colon are normal in appearance. No inflammatory changes or obstructive findings. appendix is normal. Vascular/Lymphatic: There are no enlarged abdominal or pelvic lymph nodes. The patient is status post aorto bi iliac stent graft. Reproductive: The patient is status post hysterectomy. No adnexal masses or collections seen. Other:  No evidence of abdominal wall mass or hernia. Musculoskeletal: No acute or significant osseous findings. IMPRESSION: Pancreatitis involving the pancreatic head and surrounding inflammatory changes involving the pancreatic duodenal junction. No definite loculated fluid collection. Electronically Signed   By: Prudencio Pair M.D.   On: 05/12/2019 21:25   ROS: As per HPI otherwise negative.  Physical Exam: Vitals:   05/13/19 2108 05/14/19 0456 05/14/19 0908 05/14/19 1314  BP: (!) 149/58 (!) 145/46 (!) 114/57 (!) 137/49  Pulse: 63 66 75 (!) 59  Resp: 18 16 20    Temp: 99.2 F (37.3 C) (!) 100.7 F (38.2 C)  99.1 F (37.3 C)  TempSrc: Oral Oral  Oral  SpO2: 100% 90% 98% 92%  Weight: 49.1 kg     Height:         General:  Thin chronically ill woman Head: NCAT sclera not icteric MMM Neck: Supple.  Lungs: CTA bilaterally without wheezes, rales, or rhonchi. Breathing is unlabored. Heart: RRR with S1 S2.  Abdomen: soft RUQ tenderness M-S:  Muscle wasting Lower extremities: without edema or ischemic changes, no open wounds  Neuro: A & O  X 3. Moves all extremities spontaneously. Psych:  Responds to  questions appropriately with a normal affect. Dialysis Access: AVF + bruit   Dialysis Orders:  [place] T[facility] 4 hr 350/800 EDW 51.5 3K 2.25 Ca left upper AVF heparin 2200 Mircera 75 q 2 weeks last 9.1 hectorol 1 Recent labs: hgb 9.l5 gradual decline 38% sat Ca/P ok iPTH 223 Hgb A1c 5.2  Assessment/Plan: 1. Acute on chronic pancreatitis - per primary - needs strict very low fat diet at d/c  2. ESRD -  TTS - HD tomorrow - use added K bath 3. Hypertension/volume  - continue current meds - titrate volume - may have new lower edw at d/c 4. Anemia  - hgb gradually declining - Fe strores adequate 38% sat  - watch trends - if hgb decreases ^ next ESA dose - due 9/15 5. Metabolic bone disease -  Continue hectorol/resume binders when off CL 6. Nutrition - unintentional weight loss - on CL - add CL supplements; needs help getting adequate kcal on low fat diet after d/c 7. ASCVD s/p AAA/stent/CABG, left CEA, RA stents 8. DM -well controlled - last A1c 5.2 - per primary  Myriam Jacobson, PA-C [place] [phone] 05/14/2019, 2:38 PM

## 2019-05-15 LAB — RENAL FUNCTION PANEL
Albumin: 2 g/dL — ABNORMAL LOW (ref 3.5–5.0)
Anion gap: 7 (ref 5–15)
BUN: 14 mg/dL (ref 8–23)
CO2: 17 mmol/L — ABNORMAL LOW (ref 22–32)
Calcium: 7.8 mg/dL — ABNORMAL LOW (ref 8.9–10.3)
Chloride: 114 mmol/L — ABNORMAL HIGH (ref 98–111)
Creatinine, Ser: 2.83 mg/dL — ABNORMAL HIGH (ref 0.44–1.00)
GFR calc Af Amer: 19 mL/min — ABNORMAL LOW (ref >60)
GFR calc non Af Amer: 17 mL/min — ABNORMAL LOW (ref >60)
Glucose, Bld: 124 mg/dL — ABNORMAL HIGH (ref 70–99)
Phosphorus: 4.6 mg/dL (ref 2.5–4.6)
Potassium: 4.1 mmol/L (ref 3.5–5.1)
Sodium: 138 mmol/L (ref 135–145)

## 2019-05-15 LAB — CBC
HCT: 28 % — ABNORMAL LOW (ref 36.0–46.0)
Hemoglobin: 8.9 g/dL — ABNORMAL LOW (ref 12.0–15.0)
MCH: 34.1 pg — ABNORMAL HIGH (ref 26.0–34.0)
MCHC: 31.8 g/dL (ref 30.0–36.0)
MCV: 107.3 fL — ABNORMAL HIGH (ref 80.0–100.0)
Platelets: 217 10*3/uL (ref 150–400)
RBC: 2.61 MIL/uL — ABNORMAL LOW (ref 3.87–5.11)
RDW: 15 % (ref 11.5–15.5)
WBC: 6.3 10*3/uL (ref 4.0–10.5)
nRBC: 0 % (ref 0.0–0.2)

## 2019-05-15 LAB — LIPASE, BLOOD: Lipase: 54 U/L — ABNORMAL HIGH (ref 11–51)

## 2019-05-15 MED ORDER — SODIUM CHLORIDE 0.9 % IV SOLN: 100.0000 mL | INTRAVENOUS | Status: DC | PRN

## 2019-05-15 MED ORDER — LIDOCAINE-PRILOCAINE 2.5-2.5 % EX CREA: 1.0000 "application " | TOPICAL_CREAM | CUTANEOUS | Status: DC | PRN

## 2019-05-15 MED ORDER — PENTAFLUOROPROP-TETRAFLUOROETH EX AERO: 1.0000 "application " | INHALATION_SPRAY | CUTANEOUS | Status: DC | PRN

## 2019-05-15 MED ORDER — LIDOCAINE HCL (PF) 1 % IJ SOLN: 5.0000 mL | INTRAMUSCULAR | Status: DC | PRN

## 2019-05-15 MED ORDER — HEPARIN SODIUM (PORCINE) 1000 UNIT/ML DIALYSIS: 20.0000 [IU]/kg | INTRAMUSCULAR | Status: DC | PRN

## 2019-05-15 MED ORDER — DOXERCALCIFEROL 4 MCG/2ML IV SOLN
INTRAVENOUS | Status: AC
  Administered 2019-05-15: 1 ug via INTRAVENOUS
  Filled 2019-05-15: qty 2

## 2019-05-15 MED ORDER — ALTEPLASE 2 MG IJ SOLR: 2.0000 mg | Freq: Once | INTRAMUSCULAR | Status: DC | PRN

## 2019-05-15 MED ORDER — HEPARIN SODIUM (PORCINE) 1000 UNIT/ML DIALYSIS: 1000.0000 [IU] | INTRAMUSCULAR | Status: DC | PRN

## 2019-05-15 MED ORDER — HYDROMORPHONE HCL 1 MG/ML IJ SOLN
INTRAMUSCULAR | Status: AC
  Administered 2019-05-15: 2 mg via INTRAVENOUS
  Filled 2019-05-15: qty 2

## 2019-05-15 NOTE — Progress Notes (Signed)
PROGRESS NOTE    [name]  JSE:[id number] DOB: 12/09/1950 DOA: 05/12/2019 PCP: Bernerd Limbo, MD    Brief Narrative:   [name] is a 68 y.o. female with medical history significant for CAD s/p CABG and DES, s/p mitral valve repair, ESRD on TTS HD, atrial fibrillation s/p Maze procedure 2012 not on anticoagulation, chronic diastolic CHF (EF 07-37%, T0GY by TTE 05/2018), PAD s/p b/l renal artery stents and left subclavian stent, AAA s/p endovascular repair 2019, chronic pancreatitis, COPD on 2 L supplemental O2 via [place] at night, HTN, HLD, RBBB, and depression who presents to the ED for epigastric pain associated with nausea and vomiting.  Patient has noted complicated pancreatic history since May 2019 with recurrent issues of pancreatitis.  Initial episode was felt due to gallstones and she underwent cholecystectomy but has had recurrent episodes since then.  She has had extensive prior work-up including EGD and EUS which was reportedly consistent with chronic pancreatitis with calcifications.  She had unremarkable ANA and IgG4 levels.  She had MRCP MRI which was unremarkable.  She was recently admitted at [facility] from 04/27/2019-04/30/2019 for another episode of acute on chronic pancreatitis.  She was treated with IVF, pain medications and bowel rest.  She has been on chronic pain management with oral Dilaudid.  She has also been taking Creon however feels that since the dose was increased she has had been having more frequent attacks.  Patient states she has been doing fairly well since discharge from [facility].  On the night of 05/11/2019 she had sudden onset epigastric and right upper quadrant pain which woke her from sleep.  She had adiation of the pain to her back.  She has had associated nausea with vomiting and inability to maintain adequate oral intake or her home medications.  She reports diaphoresis without subjective fevers or chills.  She reports  good urine output without dysuria.  She denies any diarrhea or constipation.  She denies any chest pain or dyspnea.  ED Course:  Initial vitals showed BP 178/73, pulse 78, RR 13, temp 99.0 Fahrenheit, SPO2 99% on room air. Labs notable for WBC 12.6, hemoglobin 11.0, MCV 107.1, platelets 318,000, potassium 4.4, BUN 7, creatinine 1.83, serum glucose 114, LFTs within normal limits, lipase 91, high-sensitivity troponin I 27. CT abdomen/pelvis without contrast showed changes suggestive of pancreatitis involving the pancreatic head and surrounding inflammatory changes involving the pancreatic duodenal junction.  No definite loculated fluid collection was noted.  Patient was given IV Dilaudid and antiemetics, 1.5 L normal saline with continued pain, nausea, and vomiting.  The hospitalist service was consulted to admit for further evaluation and management.   Assessment & Plan:   Principal Problem:   Acute on chronic pancreatitis (HCC) Active Problems:   Essential hypertension   Chronic diastolic heart failure (HCC)   COPD (chronic obstructive pulmonary disease) (HCC)   PAD (peripheral artery disease) (HCC)   Hyperlipidemia   CAD (coronary artery disease)   ESRD (end stage renal disease) (HCC)   Recurrent acute on chronic pancreatitis: Initially felt secondary to gallstone pancreatitis however has had multiple episodes since cholecystectomy.  Extensive prior work-up so far unrevealing but suggestive of chronic pancreatitis. CT again shows changes suggestive of pancreatitis involving the pancreatic head and pancreatic duodenal junction.  Lipase 91. --Follows with outpatient gastroenterology, Dr. Tora Duck with [facility] --Clear liquid diet, advance as tolerated --Continue pain control with IV Dilaudid 2 mg q2h prn severe  pain  --Supportive care, antiemetics --Repeat lipase in the a.m.; still pending --Patient needs to consider pain management outpatient  with consideration of celiac plexus block for recurrent pancreatitis  ESRD on TTS HD: Completed full regular session on 05/12/2019.  Follows with [facility].  --Nephology consulted to continue HD while inpatient  GERD: Reports worsening indigestion/heartburn since not able to take oral Protonix.   --continue Protonix 40 mg IV daily.  CAD s/p CABG/DES: PAD: Chronic and stable.  Continue carvedilol, Plavix, and atorvastatin.  Atrial fibrillation post maze procedure: Off of anticoagulation since maze procedure in 2012.  Remains in sinus rhythm.  COPD: Continue as needed albuterol and supplemental O2 at night.  Hypertension:  Continue home amlodipine, carvedilol, hydralazine.  Hyperlipidemia: Continue atorvastatin.  Chronic diastolic CHF: EF 28-31%, D1VO by TTE 05/2018.  Volume control with dialysis.   --Continue strict I's and O's and daily weights  Depression: Continue trazodone at night.   DVT prophylaxis: Heparin Code Status: Full code Family Communication: None Disposition Plan: Continue inpatient hospitalization, IV fluid hydration, IV pain control, further dependent on clinical course   Consultants:   Nephrology, Dr. Posey Pronto  Procedures:   None  Antimicrobials:   None   Subjective: Patient seen and examined at bedside, resting comfortably.  Continues with mild nausea and abdominal pain, slightly improved.  Currently tolerating clear liquid diet.  Used 8 doses of IV Dilaudid over the past 24 hours.  No other complaints or concerns at this time.  Denies headache, no fever/chills/night sweats, no vomiting/diarrhea, no chest pain, no palpitations, no cough/congestion, no weakness, no paresthesias.  No acute events overnight per nursing staff.  Objective: Vitals:   05/14/19 1734 05/14/19 2038 05/15/19 0505 05/15/19 0846  BP: (!) 141/57 (!) 148/83 (!) 155/55 (!) 142/56  Pulse: 61 (!) 59 (!) 58 60  Resp: (!) 21 18 16    Temp: 99 F (37.2 C) 98.2 F (36.8  C) 98 F (36.7 C) 98.6 F (37 C)  TempSrc: Oral Oral Oral Oral  SpO2: 96% 97% 97% 91%  Weight:  49.7 kg    Height:        Intake/Output Summary (Last 24 hours) at 05/15/2019 1359 Last data filed at 05/15/2019 1233 Gross per 24 hour  Intake 2907.51 ml  Output 500 ml  Net 2407.51 ml   Filed Weights   05/12/19 1937 05/13/19 2108 05/14/19 2038  Weight: 49.9 kg 49.1 kg 49.7 kg    Examination:  General exam: Appears calm and comfortable  Respiratory system: Clear to auscultation. Respiratory effort normal. Cardiovascular system: S1 & S2 heard, RRR. No JVD, murmurs, rubs, gallops or clicks. No pedal edema. Gastrointestinal system: Abdomen is nondistended, soft and mild epigastric tenderness, No organomegaly or masses felt. Normal bowel sounds heard. Central nervous system: Alert and oriented. No focal neurological deficits. Extremities: Symmetric 5 x 5 power. Skin: No rashes, lesions or ulcers Psychiatry: Judgement and insight appear normal. Mood & affect appropriate.     Data Reviewed: I have personally reviewed following labs and imaging studies  CBC: Recent Labs  Lab 05/12/19 1941 05/13/19 0445  WBC 12.6* 10.2  HGB 11.0* 9.3*  HCT 34.9* 30.3*  MCV 107.1* 109.0*  PLT 318 160   Basic Metabolic Panel: Recent Labs  Lab 05/12/19 1941 05/13/19 0445  NA 136 130*  K 4.4 3.7  CL 103 103  CO2 21* 19*  GLUCOSE 114* 257*  BUN 7* 8  CREATININE 1.83* 1.97*  CALCIUM 8.8* 7.5*   GFR: Estimated Creatinine Clearance:  22 mL/min (A) (by C-G formula based on SCr of 1.97 mg/dL (H)). Liver Function Tests: Recent Labs  Lab 05/12/19 1941 05/13/19 0445  AST 19 17  ALT 12 9  ALKPHOS 88 70  BILITOT 0.7 0.8  PROT 6.3* 5.2*  ALBUMIN 3.1* 2.4*   Recent Labs  Lab 05/12/19 1941  LIPASE 91*   No results for input(s): AMMONIA in the last 168 hours. Coagulation Profile: No results for input(s): INR, PROTIME in the last 168 hours. Cardiac Enzymes: No results for input(s):  CKTOTAL, CKMB, CKMBINDEX, TROPONINI in the last 168 hours. BNP (last 3 results) No results for input(s): PROBNP in the last 8760 hours. HbA1C: No results for input(s): HGBA1C in the last 72 hours. CBG: No results for input(s): GLUCAP in the last 168 hours. Lipid Profile: No results for input(s): CHOL, HDL, LDLCALC, TRIG, CHOLHDL, LDLDIRECT in the last 72 hours. Thyroid Function Tests: No results for input(s): TSH, T4TOTAL, FREET4, T3FREE, THYROIDAB in the last 72 hours. Anemia Panel: No results for input(s): VITAMINB12, FOLATE, FERRITIN, TIBC, IRON, RETICCTPCT in the last 72 hours. Sepsis Labs: No results for input(s): PROCALCITON, LATICACIDVEN in the last 168 hours.  Recent Results (from the past 240 hour(s))  SARS CORONAVIRUS 2 (TAT 6-24 HRS) Nasopharyngeal Nasopharyngeal Swab     Status: None   Collection Time: 05/12/19  9:30 PM   Specimen: Nasopharyngeal Swab  Result Value Ref Range Status   SARS Coronavirus 2 NEGATIVE NEGATIVE Final    Comment: (NOTE) SARS-CoV-2 target nucleic acids are NOT DETECTED. The SARS-CoV-2 RNA is generally detectable in upper and lower respiratory specimens during the acute phase of infection. Negative results do not preclude SARS-CoV-2 infection, do not rule out co-infections with other pathogens, and should not be used as the sole basis for treatment or other patient management decisions. Negative results must be combined with clinical observations, patient history, and epidemiological information. The expected result is Negative. Fact Sheet for Patients: SugarRoll.be Fact Sheet for Healthcare Providers: https://www.woods-mathews.com/ This test is not yet approved or cleared by the [place] FDA and  has been authorized for detection and/or diagnosis of SARS-CoV-2 by FDA under an Emergency Use Authorization (EUA). This EUA will remain  in effect (meaning this test can be used) for the duration of the  COVID-19 declaration under Section 56 4(b)(1) of the Act, 21 U.S.C. section 360bbb-3(b)(1), unless the authorization is terminated or revoked sooner. Performed at [facility], [place] [address] [place], [place] [address]   MRSA PCR Screening     Status: None   Collection Time: 05/13/19  3:28 PM   Specimen: Nasopharyngeal  Result Value Ref Range Status   MRSA by PCR NEGATIVE NEGATIVE Final    Comment:        The GeneXpert MRSA Assay (FDA approved for NASAL specimens only), is one component of a comprehensive MRSA colonization surveillance program. It is not intended to diagnose MRSA infection nor to guide or monitor treatment for MRSA infections. Performed at [facility], [place] [address], [place], [place] [address]          Radiology Studies: No results found.      Scheduled Meds: . amLODipine  5 mg Oral Daily  . atorvastatin  40 mg Oral q1800  . calcium carbonate  1 tablet Oral Daily  . carvedilol  25 mg Oral BID  . Chlorhexidine Gluconate Cloth  6 each Topical Q0600  . clopidogrel  75 mg Oral Q breakfast  . diphenhydrAMINE  50 mg Oral QHS  . doxercalciferol  1 mcg Intravenous Q T,Th,Sa-HD  . feeding supplement  1 Container Oral TID BM  . feeding supplement (PRO-STAT SUGAR FREE 64)  30 mL Oral BID  . gabapentin  300 mg Oral QHS  . heparin  5,000 Units Subcutaneous Q8H  . hydrALAZINE  25 mg Oral Q8H  . lidocaine  1 patch Transdermal Q24H  . multivitamin  1 tablet Oral QHS  . nicotine  21 mg Transdermal Daily  . pantoprazole (PROTONIX) IV  40 mg Intravenous QHS  . promethazine  12.5 mg Intravenous Once  . traZODone  100 mg Oral QHS   Continuous Infusions:    LOS: 3 days    Time spent: 32 minutes spent on chart review, discussion with nursing staff, consultants, updating family and interview/physical exam; more than 50% of that time was spent in counseling and/or coordination of care.    Elgin Carn J [place] DO [facility] Pager  [phone]  If 7PM-7AM, please contact night-coverage [email or web address] Password [email or web address] 05/15/2019, 1:59 PM

## 2019-05-15 NOTE — Care Management Important Message (Signed)
Important Message  Patient Details  Name: [name] MRN: [id number] Date of Birth: [birth year]   Medicare Important Message Given:  Yes     [name] 05/15/2019, 3:35 PM

## 2019-05-15 NOTE — Progress Notes (Signed)
[facility] Progress Note   Dialysis Orders: [place] [place] TTS 4 hr 350/800 EDW 51.5 3K 2.25 Ca left upper AVF heparin 2200 Mircera 75 q 2 weeks last 9.1 hectorol 1 Recent labs: hgb 9.l5 gradual decline 38% sat Ca/P ok iPTH 223 Hgb A1c 5.2  Assessment/Plan: 1. Acute on chronic pancreatitis - per primary - needs strict very low fat diet at d/c  2. ESRD -  TTS - HD tomorrow - use added K bath 3. Hypertension/volume  - continue current meds - titrate volume - may have new lower edw at d/c 4. Anemia  - hgb gradually declining - Fe strores adequate 38% sat  - watch trends - if hgb decreases ^ next ESA dose - due 9/15 5. Metabolic bone disease -  Continue hectorol/resume binders when off CL 6. Nutrition - unintentional weight loss - on CL - add CL supplements; needs help getting adequate kcal on low fat diet after d/c 7. ASCVD s/p AAA/stent/CABG, left CEA, RA stents 8. DM -well controlled - last A1c 5.2 - per primary   Myriam Jacobson, PA-C [facility] Beeper [phone] 05/15/2019,10:04 AM  LOS: 3 days   Subjective:   Still having significant abdominal pain  Objective Vitals:   05/14/19 1734 05/14/19 2038 05/15/19 0505 05/15/19 0846  BP: (!) 141/57 (!) 148/83 (!) 155/55 (!) 142/56  Pulse: 61 (!) 59 (!) 58 60  Resp: (!) 21 18 16    Temp: 99 F (37.2 C) 98.2 F (36.8 C) 98 F (36.7 C) 98.6 F (37 C)  TempSrc: Oral Oral Oral Oral  SpO2: 96% 97% 97% 91%  Weight:  49.7 kg    Height:       Physical Exam General: thin chronically ill female Heart: RRR Lungs: no rales Abdomen: soft RUQ tenderness Extremities: no LE edema Dialysis Access:  Left AVF + bruit   Additional Objective Labs: Basic Metabolic Panel: Recent Labs  Lab 05/12/19 1941 05/13/19 0445  NA 136 130*  K 4.4 3.7  CL 103 103  CO2 21* 19*  GLUCOSE 114* 257*  BUN 7* 8  CREATININE 1.83* 1.97*  CALCIUM 8.8* 7.5*   Liver Function Tests: Recent Labs  Lab 05/12/19 1941 05/13/19 0445   AST 19 17  ALT 12 9  ALKPHOS 88 70  BILITOT 0.7 0.8  PROT 6.3* 5.2*  ALBUMIN 3.1* 2.4*   Recent Labs  Lab 05/12/19 1941  LIPASE 91*   CBC: Recent Labs  Lab 05/12/19 1941 05/13/19 0445  WBC 12.6* 10.2  HGB 11.0* 9.3*  HCT 34.9* 30.3*  MCV 107.1* 109.0*  PLT 318 267   Blood Culture    Component Value Date/Time   SDES URINE, RANDOM 09/28/2018 2000   SPECREQUEST  09/28/2018 2000    NONE Performed at [facility], [place] [address] [place] [address], [place],  [address]    CULT >=100,000 COLONIES/mL ESCHERICHIA COLI (A) 09/28/2018 2000   REPTSTATUS 10/01/2018 FINAL 09/28/2018 2000    Cardiac Enzymes: No results for input(s): CKTOTAL, CKMB, CKMBINDEX, TROPONINI in the last 168 hours. CBG: No results for input(s): GLUCAP in the last 168 hours. Iron Studies: No results for input(s): IRON, TIBC, TRANSFERRIN, FERRITIN in the last 72 hours. Lab Results  Component Value Date   INR 1.28 06/15/2018   INR 1.13 05/28/2018   INR 1.24 04/09/2018   Studies/Results: No results found. Medications:  . amLODipine  5 mg Oral Daily  . atorvastatin  40 mg Oral q1800  . calcium carbonate  1 tablet  Oral Daily  . carvedilol  25 mg Oral BID  . Chlorhexidine Gluconate Cloth  6 each Topical Q0600  . clopidogrel  75 mg Oral Q breakfast  . diphenhydrAMINE  50 mg Oral QHS  . doxercalciferol  1 mcg Intravenous Q T,Th,Sa-HD  . feeding supplement  1 Container Oral TID BM  . feeding supplement (PRO-STAT SUGAR FREE 64)  30 mL Oral BID  . gabapentin  300 mg Oral QHS  . heparin  5,000 Units Subcutaneous Q8H  . hydrALAZINE  25 mg Oral Q8H  . lidocaine  1 patch Transdermal Q24H  . multivitamin  1 tablet Oral QHS  . nicotine  21 mg Transdermal Daily  . pantoprazole (PROTONIX) IV  40 mg Intravenous QHS  . promethazine  12.5 mg Intravenous Once  . traZODone  100 mg Oral QHS

## 2019-05-15 NOTE — Procedures (Signed)
Patient seen on Hemodialysis. BP (!) 142/56 (BP Location: Right Arm)   Pulse 60   Temp 98.6 F (37 C) (Oral)   Resp 16   Ht 5\' 5"  (1.651 m)   Wt 49.7 kg   SpO2 91%   BMI 18.23 kg/m   QB 400, UF goal 1.5L Tolerating treatment without complaints at this time.   Elmarie Shiley MD [facility]. Office # [phone] Pager # [phone] 4:31 PM

## 2019-05-15 NOTE — Plan of Care (Signed)
?  Problem: Clinical Measurements: ?Goal: Will remain free from infection ?Outcome: Progressing ?  ?Problem: Clinical Measurements: ?Goal: Diagnostic test results will improve ?Outcome: Progressing ?  ?

## 2019-05-16 LAB — COMPREHENSIVE METABOLIC PANEL
ALT: 9 U/L (ref 0–44)
AST: 14 U/L — ABNORMAL LOW (ref 15–41)
Albumin: 2 g/dL — ABNORMAL LOW (ref 3.5–5.0)
Alkaline Phosphatase: 60 U/L (ref 38–126)
Anion gap: 5 (ref 5–15)
BUN: 6 mg/dL — ABNORMAL LOW (ref 8–23)
CO2: 25 mmol/L (ref 22–32)
Calcium: 7.5 mg/dL — ABNORMAL LOW (ref 8.9–10.3)
Chloride: 104 mmol/L (ref 98–111)
Creatinine, Ser: 1.61 mg/dL — ABNORMAL HIGH (ref 0.44–1.00)
GFR calc Af Amer: 38 mL/min — ABNORMAL LOW (ref >60)
GFR calc non Af Amer: 33 mL/min — ABNORMAL LOW (ref >60)
Glucose, Bld: 91 mg/dL (ref 70–99)
Potassium: 3.2 mmol/L — ABNORMAL LOW (ref 3.5–5.1)
Sodium: 134 mmol/L — ABNORMAL LOW (ref 135–145)
Total Bilirubin: 0.5 mg/dL (ref 0.3–1.2)
Total Protein: 4.2 g/dL — ABNORMAL LOW (ref 6.5–8.1)

## 2019-05-16 LAB — CBC
HCT: 26.6 % — ABNORMAL LOW (ref 36.0–46.0)
Hemoglobin: 8.6 g/dL — ABNORMAL LOW (ref 12.0–15.0)
MCH: 33.7 pg (ref 26.0–34.0)
MCHC: 32.3 g/dL (ref 30.0–36.0)
MCV: 104.3 fL — ABNORMAL HIGH (ref 80.0–100.0)
Platelets: 183 10*3/uL (ref 150–400)
RBC: 2.55 MIL/uL — ABNORMAL LOW (ref 3.87–5.11)
RDW: 14.8 % (ref 11.5–15.5)
WBC: 5.3 10*3/uL (ref 4.0–10.5)
nRBC: 0 % (ref 0.0–0.2)

## 2019-05-16 LAB — LIPASE, BLOOD: Lipase: 28 U/L (ref 11–51)

## 2019-05-16 MED ORDER — CHLORHEXIDINE GLUCONATE CLOTH 2 % EX PADS: 6.0000 | MEDICATED_PAD | Freq: Every day | CUTANEOUS | Status: DC

## 2019-05-16 NOTE — Evaluation (Signed)
Physical Therapy Evaluation Patient Details Name: [name] MRN: [id number] DOB: 10-13-1950 Today's Date: 05/16/2019   History of Present Illness  Pt is a 68 y.o. F with significant PMH of CAD s/p CABG and DES, s/p mitral valve repair, ESRD on TTS HD, atrial fibrillation s/p Maze procedure, chronic diastolic HF, PAD, AAA s/p endovascular repair, chronic pancreatitis, COPD, HTN, who presents with epigastric pain associated with nausea and vomiting.   Clinical Impression  Pt admitted with above diagnosis. Pt presents with decreased functional mobility secondary to pain, decreased endurance, and generalized weakness. Pt ambulating 400 feet with no assistive device at a min guard assist level. Pt reports she might be a "little weaker than normal," but overall is close to her baseline. Suspect with adequate pain control/management, pt will progress well and do not anticipate need for PT follow up. Will follow acutely to encourage increased mobilization.     Follow Up Recommendations No PT follow up;Supervision - Intermittent    Equipment Recommendations  None recommended by PT    Recommendations for Other Services       Precautions / Restrictions Precautions Precautions: Fall Restrictions Weight Bearing Restrictions: No      Mobility  Bed Mobility Overal bed mobility: Modified Independent             General bed mobility comments: Increased time, use of bed rail  Transfers Overall transfer level: Needs assistance Equipment used: None Transfers: Sit to/from Stand Sit to Stand: Supervision            Ambulation/Gait Ambulation/Gait assistance: Min guard Gait Distance (Feet): 400 Feet Assistive device: None Gait Pattern/deviations: Step-through pattern;Decreased stride length;Decreased dorsiflexion - right Gait velocity: decreased   General Gait Details: Min guard for stability when provided challenge i.e. stepping over obstacles and turns, slower speed, no overt  LOB  Stairs            Wheelchair Mobility    Modified Rankin (Stroke Patients Only)       Balance Overall balance assessment: Mild deficits observed, not formally tested                                           Pertinent Vitals/Pain Pain Assessment: Faces Faces Pain Scale: Hurts even more Pain Location: epigastric, back Pain Descriptors / Indicators: Grimacing;Discomfort;Guarding Pain Intervention(s): Monitored during session;Limited activity within patient's tolerance;Premedicated before session    Home Living Family/patient expects to be discharged to:: Private residence Living Arrangements: Children Available Help at Discharge: Family;Available 24 hours/day Type of Home: House Home Access: Stairs to enter;Ramped entrance   Entrance Stairs-Number of Steps: 4 Home Layout: One level Home Equipment: Walker - 4 wheels;Bedside commode;Shower seat - built in;Transport chair;Grab bars - tub/shower      Prior Function Level of Independence: Independent         Comments: Independent ADL's/IADL's, drives to dialysis     Hand Dominance   Dominant Hand: Right    Extremity/Trunk Assessment   Upper Extremity Assessment Upper Extremity Assessment: Overall WFL for tasks assessed    Lower Extremity Assessment Lower Extremity Assessment: RLE deficits/detail;LLE deficits/detail RLE Deficits / Details: Hip flexion 3+/5, knee extension 5/5, ankle dorsiflexion 2/5 LLE Deficits / Details: Hip flexion 3+/5, knee extension 5/5, ankle dorsiflexion 5/5       Communication   Communication: No difficulties  Cognition Arousal/Alertness: Awake/alert Behavior During Therapy: WFL for tasks assessed/performed  Overall Cognitive Status: Within Functional Limits for tasks assessed                                        General Comments      Exercises     Assessment/Plan    PT Assessment Patient needs continued PT services  PT Problem  List Decreased strength;Decreased activity tolerance;Decreased mobility;Decreased balance;Pain       PT Treatment Interventions Gait training;Functional mobility training;Therapeutic activities;Therapeutic exercise;Balance training;Patient/family education    PT Goals (Current goals can be found in the Care Plan section)  Acute Rehab PT Goals Patient Stated Goal: "get home in a couple of days." PT Goal Formulation: With patient Time For Goal Achievement: 05/30/19 Potential to Achieve Goals: Good    Frequency Min 3X/week   Barriers to discharge        Co-evaluation               AM-PAC PT "6 Clicks" Mobility  Outcome Measure Help needed turning from your back to your side while in a flat bed without using bedrails?: None Help needed moving from lying on your back to sitting on the side of a flat bed without using bedrails?: None Help needed moving to and from a bed to a chair (including a wheelchair)?: None Help needed standing up from a chair using your arms (e.g., wheelchair or bedside chair)?: None Help needed to walk in hospital room?: A Little Help needed climbing 3-5 steps with a railing? : A Little 6 Click Score: 22    End of Session Equipment Utilized During Treatment: Gait belt Activity Tolerance: Patient tolerated treatment well Patient left: in bed;with call bell/phone within reach Nurse Communication: Mobility status PT Visit Diagnosis: Unsteadiness on feet (R26.81);Pain Pain - part of body: (back, epigastric)    Time: 0071-2197 PT Time Calculation (min) (ACUTE ONLY): 27 min   Charges:   PT Evaluation $PT Eval Moderate Complexity: 1 Mod PT Treatments $Therapeutic Activity: 8-22 mins       [name], PT, DPT Acute Rehabilitation Services Pager [phone] Office [phone]   Willy Eddy 05/16/2019, 4:46 PM

## 2019-05-16 NOTE — Progress Notes (Addendum)
PROGRESS NOTE    [name]  QIO:[id number] DOB: 13-May-1951 DOA: 05/12/2019 PCP: Bernerd Limbo, MD   Brief Narrative: See 68-year-old with past medical history significant for coronary artery disease a status post CABG and DES status post mitral valve replaced, end-stage renal disease on hemodialysis, A. fib status post maze procedure 2020 not on anticoagulation, chronic diastolic heart failure peripheral artery disease bilateral artery renal stent, left subclavian stent,AAA status post endovascular repair 2019 chronic pancreatitis, COPD on 2 L of oxygen right bundle branch block who presents complaining of recurrent abdominal pain epigastric with nausea and vomiting. Patient has a noted complicated history of pancreatitis since 2019 with recurrent issues.  Initially felt to be related to gallstone and underwent cholecystectomy but has had recurrent episodes since then.  She has had extensive work-up including endoscopy and EUS which was reportedly consistent with chronic pancreatitis and calcification.  Unremarkable ANA and IgG4 levels.  She has had MRCP and MRI which was unremarkable. Recent admission at [facility] on August 21 on July 24 for pancreatitis.  Her Creon was increased at that admission. Recent complaining of worsening abdominal pain.  CT abdomen show changes suggestive of pancreatitis involving the pancreatic head and surrounding inflammatory changes.    Assessment & Plan:   Principal Problem:   Acute on chronic pancreatitis (HCC) Active Problems:   Essential hypertension   Chronic diastolic heart failure (HCC)   COPD (chronic obstructive pulmonary disease) (HCC)   PAD (peripheral artery disease) (HCC)   Hyperlipidemia   CAD (coronary artery disease)   ESRD (end stage renal disease) (HCC)   1-recurrent acute idiopathic pancreatitis on chronic: Initially thought to be related to gallstone and underwent cholecystectomy without resolution of this episodes. Extensive  work-up as above. CT abdomen suggestive of acute pancreatitis. We pain medication for today with plan to transition to oral tomorrow. Plan to advance diet to full liquid diet. She will need to follow-up with her gastroenterologist for further options.  End-stage renal disease on hemodialysis: Appreciate nephrology team.  Continue with hemodialysis  GERD:; Continue with Protonix  Coronary artery disease status post CABG DES: Continue with carvedilol Plavix and atorvastatin  A. fib post maze procedure: Sinus rhythm COPD: On home oxygen at night. Hypertension continue with Norvasc hydralazine and amlodipine Hyperlipidemia: Continue with atorvastatin Chronic diastolic heart failure; volume managed with dialysis Hypokalemia: Replete orally Pressure Injury 08/15/18 Stage I -  Intact skin with non-blanchable redness of a localized area usually over a bony prominence. moisture associated skin damage related to frequent diarrhea (Active)  08/15/18 1552  Location: Sacrum  Location Orientation: Posterior  Staging: Stage I -  Intact skin with non-blanchable redness of a localized area usually over a bony prominence.  Wound Description (Comments): moisture associated skin damage related to frequent diarrhea  Present on Admission: Yes     Pressure Injury 07/17/18 Unstageable - Full thickness tissue loss in which the base of the ulcer is covered by slough (yellow, tan, gray, green or brown) and/or eschar (tan, brown or black) in the wound bed. (Active)  07/17/18 1320  Location:   Location Orientation:   Staging: Unstageable - Full thickness tissue loss in which the base of the ulcer is covered by slough (yellow, tan, gray, green or brown) and/or eschar (tan, brown or black) in the wound bed.  Wound Description (Comments):   Present on Admission:      Nutrition Problem: Increased nutrient needs Etiology: (acute on chronic pancreatitis)    Signs/Symptoms: estimated needs  Interventions: Boost Breeze, Prostat  Estimated body mass index is 19.33 kg/m as calculated from the following:   Height as of this encounter: 5\' 5"  (1.651 m).   Weight as of this encounter: 52.7 kg.   DVT prophylaxis: Heparin Code Status: Full code Family Communication: None Disposition Plan: Continue inpatient due to persistent pain and requiring IV narcotics Consultants:   Nephrology  Procedures:   Hemodialysis  Antimicrobials:  None  Subjective: Patient was still reporting abdominal pain, at least she did not require pain medication overnight. She required IV pain medication this morning.  Objective: Vitals:   05/16/19 0431 05/16/19 0922 05/16/19 1307 05/16/19 1307  BP: (!) 150/42 (!) 141/70 (!) 144/77 (!) 144/57  Pulse: (!) 57 62  63  Resp: 16 18    Temp: 98.2 F (36.8 C) 98.4 F (36.9 C)    TempSrc: Oral Oral    SpO2: 100% 98%  94%  Weight:      Height:        Intake/Output Summary (Last 24 hours) at 05/16/2019 1620 Last data filed at 05/16/2019 1315 Gross per 24 hour  Intake 597 ml  Output 1000 ml  Net -403 ml   Filed Weights   05/15/19 1600 05/15/19 2056 05/15/19 2207  Weight: 53.1 kg 52.8 kg 52.7 kg    Examination:  General exam: Appears calm and comfortable  Respiratory system: Clear to auscultation. Respiratory effort normal. Cardiovascular system: S1 & S2 heard, RRR. No JVD, murmurs, rubs, gallops or clicks. No pedal edema. Gastrointestinal system: Abdomen is nondistended, soft and nontender. No organomegaly or masses felt. Normal bowel sounds heard. Central nervous system: Alert and oriented. No focal neurological deficits. Extremities: Symmetric 5 x 5 power. Skin: No rashes, lesions or ulcers Psychiatry: Judgement and insight appear normal. Mood & affect appropriate.     Data Reviewed: I have personally reviewed following labs and imaging studies  CBC: Recent Labs  Lab 05/12/19 1941 05/13/19 0445 05/15/19 1631 05/16/19 0448  WBC  12.6* 10.2 6.3 5.3  HGB 11.0* 9.3* 8.9* 8.6*  HCT 34.9* 30.3* 28.0* 26.6*  MCV 107.1* 109.0* 107.3* 104.3*  PLT 318 267 217 801   Basic Metabolic Panel: Recent Labs  Lab 05/12/19 1941 05/13/19 0445 05/15/19 1631 05/16/19 0448  NA 136 130* 138 134*  K 4.4 3.7 4.1 3.2*  CL 103 103 114* 104  CO2 21* 19* 17* 25  GLUCOSE 114* 257* 124* 91  BUN 7* 8 14 6*  CREATININE 1.83* 1.97* 2.83* 1.61*  CALCIUM 8.8* 7.5* 7.8* 7.5*  PHOS  --   --  4.6  --    GFR: Estimated Creatinine Clearance: 28.6 mL/min (A) (by C-G formula based on SCr of 1.61 mg/dL (H)). Liver Function Tests: Recent Labs  Lab 05/12/19 1941 05/13/19 0445 05/15/19 1631 05/16/19 0448  AST 19 17  --  14*  ALT 12 9  --  9  ALKPHOS 88 70  --  60  BILITOT 0.7 0.8  --  0.5  PROT 6.3* 5.2*  --  4.2*  ALBUMIN 3.1* 2.4* 2.0* 2.0*   Recent Labs  Lab 05/12/19 1941 05/15/19 1631 05/16/19 0448  LIPASE 91* 54* 28   No results for input(s): AMMONIA in the last 168 hours. Coagulation Profile: No results for input(s): INR, PROTIME in the last 168 hours. Cardiac Enzymes: No results for input(s): CKTOTAL, CKMB, CKMBINDEX, TROPONINI in the last 168 hours. BNP (last 3 results) No results for input(s): PROBNP in the last 8760 hours. HbA1C: No results  for input(s): HGBA1C in the last 72 hours. CBG: No results for input(s): GLUCAP in the last 168 hours. Lipid Profile: No results for input(s): CHOL, HDL, LDLCALC, TRIG, CHOLHDL, LDLDIRECT in the last 72 hours. Thyroid Function Tests: No results for input(s): TSH, T4TOTAL, FREET4, T3FREE, THYROIDAB in the last 72 hours. Anemia Panel: No results for input(s): VITAMINB12, FOLATE, FERRITIN, TIBC, IRON, RETICCTPCT in the last 72 hours. Sepsis Labs: No results for input(s): PROCALCITON, LATICACIDVEN in the last 168 hours.  Recent Results (from the past 240 hour(s))  SARS CORONAVIRUS 2 (TAT 6-24 HRS) Nasopharyngeal Nasopharyngeal Swab     Status: None   Collection Time: 05/12/19   9:30 PM   Specimen: Nasopharyngeal Swab  Result Value Ref Range Status   SARS Coronavirus 2 NEGATIVE NEGATIVE Final    Comment: (NOTE) SARS-CoV-2 target nucleic acids are NOT DETECTED. The SARS-CoV-2 RNA is generally detectable in upper and lower respiratory specimens during the acute phase of infection. Negative results do not preclude SARS-CoV-2 infection, do not rule out co-infections with other pathogens, and should not be used as the sole basis for treatment or other patient management decisions. Negative results must be combined with clinical observations, patient history, and epidemiological information. The expected result is Negative. Fact Sheet for Patients: SugarRoll.be Fact Sheet for Healthcare Providers: https://www.woods-mathews.com/ This test is not yet approved or cleared by the [place] FDA and  has been authorized for detection and/or diagnosis of SARS-CoV-2 by FDA under an Emergency Use Authorization (EUA). This EUA will remain  in effect (meaning this test can be used) for the duration of the COVID-19 declaration under Section 56 4(b)(1) of the Act, 21 U.S.C. section 360bbb-3(b)(1), unless the authorization is terminated or revoked sooner. Performed at [facility], [place] [address] [place], [place] [address]   MRSA PCR Screening     Status: None   Collection Time: 05/13/19  3:28 PM   Specimen: Nasopharyngeal  Result Value Ref Range Status   MRSA by PCR NEGATIVE NEGATIVE Final    Comment:        The GeneXpert MRSA Assay (FDA approved for NASAL specimens only), is one component of a comprehensive MRSA colonization surveillance program. It is not intended to diagnose MRSA infection nor to guide or monitor treatment for MRSA infections. Performed at [facility], [place] [address] [place], [place] [address]          Radiology Studies: No results found.      Scheduled Meds: . amLODipine   5 mg Oral Daily  . atorvastatin  40 mg Oral q1800  . calcium carbonate  1 tablet Oral Daily  . carvedilol  25 mg Oral BID  . Chlorhexidine Gluconate Cloth  6 each Topical Q0600  . clopidogrel  75 mg Oral Q breakfast  . diphenhydrAMINE  50 mg Oral QHS  . doxercalciferol  1 mcg Intravenous Q T,Th,Sa-HD  . feeding supplement  1 Container Oral TID BM  . feeding supplement (PRO-STAT SUGAR FREE 64)  30 mL Oral BID  . gabapentin  300 mg Oral QHS  . heparin  5,000 Units Subcutaneous Q8H  . hydrALAZINE  25 mg Oral Q8H  . lidocaine  1 patch Transdermal Q24H  . multivitamin  1 tablet Oral QHS  . nicotine  21 mg Transdermal Daily  . pantoprazole (PROTONIX) IV  40 mg Intravenous QHS  . promethazine  12.5 mg Intravenous Once  . traZODone  100 mg Oral QHS   Continuous Infusions:  LOS: 4 days    Time spent: 35 minutes     Elmarie Shiley, MD [facility] Pager [phone]  If 7PM-7AM, please contact night-coverage [email or web address] Password [email or web address] 05/16/2019, 4:20 PM

## 2019-05-16 NOTE — Plan of Care (Signed)
  Problem: Health Behavior/Discharge Planning: Goal: Ability to manage health-related needs will improve Outcome: Progressing   Problem: Nutrition: Goal: Adequate nutrition will be maintained Outcome: Progressing   Problem: Pain Managment: Goal: General experience of comfort will improve Outcome: Progressing   

## 2019-05-16 NOTE — Progress Notes (Signed)
[facility] Progress Note   Dialysis Orders: [place] [facility] 4 hr 350/800 EDW 51.5 3K 2.25 Ca left upper AVF heparin 2200 Mircera 75 q 2 weeks last 9.1 hectorol 1 Recent labs: hgb 9.5 gradual decline 38% sat Ca/P ok iPTH 223 Hgb A1c 5.2  Assessment/Plan: 1. Acute on chronic pancreatitis - per primary - needs strict very low fat diet at d/c  2. ESRD -  TTS - HD tomorrow - K 3.2 - start on added K bath Cr/BUN low - very low muscle mass- doubt she needs 4 hr HD - change to 3.5 hour for now 3. Hypertension/volume  - continue current meds - titrate volume net UF 1 L Tuesday with post wt 52.8 - above prior edw - no BP drop - UF more Thursday - needs standing wts for accuracy 4. Anemia  - hgb gradually declining  hgb 8.6 - Fe strores adequate 38% sat  - watch trends - if hgb decreases ^ next ESA dose - due 9/15 5. Metabolic bone disease -  Continue hectorol/resume binders when off CL 6. Nutrition - unintentional weight loss - on CL - add CL supplements; needs help getting adequate kcal on low fat diet after d/c- adv diet per primary - FL can be high in fat so need to specify low fat if changed to FL 7. ASCVD s/p AAA/stent/CABG, left CEA, RA stents 8. DM -well controlled - last A1c 5.2 - per primary   Myriam Jacobson, PA-C [place] [phone] 05/16/2019,9:37 AM  LOS: 4 days   Subjective:   Pain down from 9 - 10 to 6 - would like full liquids or crax, oatmeal - something more  Objective Vitals:   05/15/19 2056 05/15/19 2207 05/16/19 0431 05/16/19 0922  BP:  (!) 141/57 (!) 150/42 (!) 141/70  Pulse:  65 (!) 57 62  Resp:  16 16 18   Temp:  99 F (37.2 C) 98.2 F (36.8 C) 98.4 F (36.9 C)  TempSrc:  Oral Oral Oral  SpO2:  95% 100% 98%  Weight: 52.8 kg 52.7 kg    Height:       Physical Exam General: thin chronically ill female Heart: RRR Lungs: no rales Abdomen: soft RUQ tenderness Extremities: no LE edema Dialysis Access:  Left AVF +  bruit   Additional Objective Labs: Basic Metabolic Panel: Recent Labs  Lab 05/13/19 0445 05/15/19 1631 05/16/19 0448  NA 130* 138 134*  K 3.7 4.1 3.2*  CL 103 114* 104  CO2 19* 17* 25  GLUCOSE 257* 124* 91  BUN 8 14 6*  CREATININE 1.97* 2.83* 1.61*  CALCIUM 7.5* 7.8* 7.5*  PHOS  --  4.6  --    Liver Function Tests: Recent Labs  Lab 05/12/19 1941 05/13/19 0445 05/15/19 1631 05/16/19 0448  AST 19 17  --  14*  ALT 12 9  --  9  ALKPHOS 88 70  --  60  BILITOT 0.7 0.8  --  0.5  PROT 6.3* 5.2*  --  4.2*  ALBUMIN 3.1* 2.4* 2.0* 2.0*   Recent Labs  Lab 05/12/19 1941 05/15/19 1631 05/16/19 0448  LIPASE 91* 54* 28   CBC: Recent Labs  Lab 05/12/19 1941 05/13/19 0445 05/15/19 1631 05/16/19 0448  WBC 12.6* 10.2 6.3 5.3  HGB 11.0* 9.3* 8.9* 8.6*  HCT 34.9* 30.3* 28.0* 26.6*  MCV 107.1* 109.0* 107.3* 104.3*  PLT 318 267 217 183   Cardiac Enzymes: No results for input(s): CKTOTAL, CKMB, CKMBINDEX, TROPONINI  in the last 168 hours. CBG: No results for input(s): GLUCAP in the last 168 hours. Iron Studies: No results for input(s): IRON, TIBC, TRANSFERRIN, FERRITIN in the last 72 hours. Lab Results  Component Value Date   INR 1.28 06/15/2018   INR 1.13 05/28/2018   INR 1.24 04/09/2018   Studies/Results: No results found. Medications:  . amLODipine  5 mg Oral Daily  . atorvastatin  40 mg Oral q1800  . calcium carbonate  1 tablet Oral Daily  . carvedilol  25 mg Oral BID  . Chlorhexidine Gluconate Cloth  6 each Topical Q0600  . clopidogrel  75 mg Oral Q breakfast  . diphenhydrAMINE  50 mg Oral QHS  . doxercalciferol  1 mcg Intravenous Q T,Th,Sa-HD  . feeding supplement  1 Container Oral TID BM  . feeding supplement (PRO-STAT SUGAR FREE 64)  30 mL Oral BID  . gabapentin  300 mg Oral QHS  . heparin  5,000 Units Subcutaneous Q8H  . hydrALAZINE  25 mg Oral Q8H  . lidocaine  1 patch Transdermal Q24H  . multivitamin  1 tablet Oral QHS  . nicotine  21 mg  Transdermal Daily  . pantoprazole (PROTONIX) IV  40 mg Intravenous QHS  . promethazine  12.5 mg Intravenous Once  . traZODone  100 mg Oral QHS

## 2019-05-17 LAB — CBC
HCT: 30.4 % — ABNORMAL LOW (ref 36.0–46.0)
Hemoglobin: 9.5 g/dL — ABNORMAL LOW (ref 12.0–15.0)
MCH: 33.1 pg (ref 26.0–34.0)
MCHC: 31.3 g/dL (ref 30.0–36.0)
MCV: 105.9 fL — ABNORMAL HIGH (ref 80.0–100.0)
Platelets: 208 10*3/uL (ref 150–400)
RBC: 2.87 MIL/uL — ABNORMAL LOW (ref 3.87–5.11)
RDW: 14.9 % (ref 11.5–15.5)
WBC: 5.7 10*3/uL (ref 4.0–10.5)
nRBC: 0 % (ref 0.0–0.2)

## 2019-05-17 LAB — RENAL FUNCTION PANEL
Albumin: 2.2 g/dL — ABNORMAL LOW (ref 3.5–5.0)
Anion gap: 8 (ref 5–15)
BUN: 10 mg/dL (ref 8–23)
CO2: 21 mmol/L — ABNORMAL LOW (ref 22–32)
Calcium: 8.1 mg/dL — ABNORMAL LOW (ref 8.9–10.3)
Chloride: 107 mmol/L (ref 98–111)
Creatinine, Ser: 2.06 mg/dL — ABNORMAL HIGH (ref 0.44–1.00)
GFR calc Af Amer: 28 mL/min — ABNORMAL LOW (ref >60)
GFR calc non Af Amer: 24 mL/min — ABNORMAL LOW (ref >60)
Glucose, Bld: 95 mg/dL (ref 70–99)
Phosphorus: 4.1 mg/dL (ref 2.5–4.6)
Potassium: 3.7 mmol/L (ref 3.5–5.1)
Sodium: 136 mmol/L (ref 135–145)

## 2019-05-17 MED ORDER — SODIUM CHLORIDE 0.9 % IV SOLN: 100.0000 mL | INTRAVENOUS | Status: DC | PRN

## 2019-05-17 MED ORDER — HYDROMORPHONE HCL 1 MG/ML IJ SOLN
INTRAMUSCULAR | Status: AC
  Administered 2019-05-17: 2 mg via INTRAVENOUS
  Filled 2019-05-17: qty 2

## 2019-05-17 MED ORDER — LIDOCAINE-PRILOCAINE 2.5-2.5 % EX CREA: 1.0000 "application " | TOPICAL_CREAM | CUTANEOUS | Status: DC | PRN

## 2019-05-17 MED ORDER — HYDROMORPHONE HCL 1 MG/ML IJ SOLN
INTRAMUSCULAR | Status: AC
  Filled 2019-05-17: qty 2

## 2019-05-17 MED ORDER — DOXERCALCIFEROL 4 MCG/2ML IV SOLN
INTRAVENOUS | Status: AC
  Filled 2019-05-17: qty 2

## 2019-05-17 MED ORDER — HEPARIN SODIUM (PORCINE) 1000 UNIT/ML DIALYSIS: 1000.0000 [IU] | INTRAMUSCULAR | Status: DC | PRN

## 2019-05-17 MED ORDER — FERRIC CITRATE 1 GM 210 MG(FE) PO TABS
210.0000 mg | ORAL_TABLET | Freq: Three times a day (TID) | ORAL | Status: DC
  Administered 2019-05-17 – 2019-05-18 (×3): 210 mg via ORAL
  Filled 2019-05-17 (×3): qty 1

## 2019-05-17 MED ORDER — LIDOCAINE HCL (PF) 1 % IJ SOLN: 5.0000 mL | INTRAMUSCULAR | Status: DC | PRN

## 2019-05-17 MED ORDER — HEPARIN SODIUM (PORCINE) 1000 UNIT/ML DIALYSIS: 20.0000 [IU]/kg | INTRAMUSCULAR | Status: DC | PRN

## 2019-05-17 MED ORDER — PANCRELIPASE (LIP-PROT-AMYL) 12000-38000 UNITS PO CPEP
24000.0000 [IU] | ORAL_CAPSULE | Freq: Three times a day (TID) | ORAL | Status: DC
  Administered 2019-05-17 – 2019-05-18 (×4): 24000 [IU] via ORAL
  Filled 2019-05-17 (×4): qty 2

## 2019-05-17 MED ORDER — ALTEPLASE 2 MG IJ SOLR: 2.0000 mg | Freq: Once | INTRAMUSCULAR | Status: DC | PRN

## 2019-05-17 MED ORDER — HYDROMORPHONE HCL 1 MG/ML IJ SOLN
2.0000 mg | Freq: Four times a day (QID) | INTRAMUSCULAR | Status: DC | PRN
  Administered 2019-05-17: 2 mg via INTRAVENOUS
  Filled 2019-05-17 (×2): qty 2

## 2019-05-17 MED ORDER — DARBEPOETIN ALFA 60 MCG/0.3ML IJ SOSY: 60.0000 ug | PREFILLED_SYRINGE | INTRAMUSCULAR | Status: DC

## 2019-05-17 MED ORDER — PENTAFLUOROPROP-TETRAFLUOROETH EX AERO: 1.0000 "application " | INHALATION_SPRAY | CUTANEOUS | Status: DC | PRN

## 2019-05-17 MED ORDER — HYDROMORPHONE HCL 2 MG PO TABS
2.0000 mg | ORAL_TABLET | ORAL | Status: DC | PRN
  Administered 2019-05-17: 4 mg via ORAL
  Filled 2019-05-17 (×2): qty 2

## 2019-05-17 NOTE — Procedures (Signed)
Patient seen on Hemodialysis. BP (!) 138/59   Pulse 63   Temp 99 F (37.2 C) (Oral)   Resp 16   Ht 5\' 5"  (1.651 m)   Wt 53.6 kg   SpO2 91%   BMI 19.66 kg/m   QB 400, UF goal 2L Tolerating treatment without complaints at this time.   Elmarie Shiley MD [facility]. Office # [phone] Pager # [phone] 10:20 AM

## 2019-05-17 NOTE — Progress Notes (Signed)
[name] Progress Note   Dialysis Orders: [place] [facility] 4 hr 350/800 EDW 51.5 3K 2.25 Ca left upper AVF heparin 2200 Mircera 75 q 2 weeks last 9.1 hectorol 1 Recent labs: hgb 9.5 gradual decline 38% sat Ca/P ok iPTH 223 Hgb A1c 5.2  Assessment/Plan: 1. Acute on chronic pancreatitis - per primary - needs strict very low fat diet at d/c  2. ESRD -  TTS - on HD  Cr/BUN low - very low muscle mass- doubt she needs 4 hr HD - change to 3.5 hour for d/c  -labs pending starting on added K bath 3. Hypertension/volume  - continue current meds - titrate volume net UF 1 L Tuesday with post wt 52.8 - above prior edw - no BP drop - UF more Thursday - needs standing wts for accuracy- pre wt 53.6 standing goal 2.5 today- with BP trending down to SBP 106 - lowered goal to 2 L  4. Anemia  - hgb 8.6 > 9.5 - Fe strores adequate 38% sat  - watch trends -  next ESA dose - due 9/15 5. Metabolic bone disease -  Continue hectorol/resumed Aurixya binder 6. Nutrition - unintentional weight loss -diet advanced to FL - will increase fluid restriction to 1500 - should be able to have crackers or other nonfat foods. 7. ASCVD s/p AAA/stent/CABG, left CEA, RA stents 8. DM -well controlled - last A1c 5.2 - per primary  Myriam Jacobson, PA-C [facility] [phone] 05/17/2019,9:00 AM  LOS: 5 days   Subjective:   Resting comfortably on HD  Objective Vitals:   05/17/19 0750 05/17/19 0759 05/17/19 0805 05/17/19 0830  BP: (!) 156/69 (!) 165/66 (!) 177/68 (!) 119/51  Pulse: 67 64 65 61  Resp: 18  16   Temp: 99 F (37.2 C)     TempSrc: Oral     SpO2: 91%     Weight: 53.6 kg     Height:       Physical Exam General: thin chronically ill female Heart: RRR Lungs: no rales Abdomen: mild  soft RUQ tenderness Extremities: no LE edema Dialysis Access:  Left AVF Qb 400   Additional Objective Labs: Basic Metabolic Panel: Recent Labs  Lab 05/13/19 0445 05/15/19 1631 05/16/19 0448   NA 130* 138 134*  K 3.7 4.1 3.2*  CL 103 114* 104  CO2 19* 17* 25  GLUCOSE 257* 124* 91  BUN 8 14 6*  CREATININE 1.97* 2.83* 1.61*  CALCIUM 7.5* 7.8* 7.5*  PHOS  --  4.6  --    Liver Function Tests: Recent Labs  Lab 05/12/19 1941 05/13/19 0445 05/15/19 1631 05/16/19 0448  AST 19 17  --  14*  ALT 12 9  --  9  ALKPHOS 88 70  --  60  BILITOT 0.7 0.8  --  0.5  PROT 6.3* 5.2*  --  4.2*  ALBUMIN 3.1* 2.4* 2.0* 2.0*   Recent Labs  Lab 05/12/19 1941 05/15/19 1631 05/16/19 0448  LIPASE 91* 54* 28   CBC: Recent Labs  Lab 05/12/19 1941 05/13/19 0445 05/15/19 1631 05/16/19 0448 05/17/19 0810  WBC 12.6* 10.2 6.3 5.3 5.7  HGB 11.0* 9.3* 8.9* 8.6* 9.5*  HCT 34.9* 30.3* 28.0* 26.6* 30.4*  MCV 107.1* 109.0* 107.3* 104.3* 105.9*  PLT 318 267 217 183 208   Cardiac Enzymes: No results for input(s): CKTOTAL, CKMB, CKMBINDEX, TROPONINI in the last 168 hours. CBG: No results for input(s): GLUCAP in the last 168 hours. Iron  Studies: No results for input(s): IRON, TIBC, TRANSFERRIN, FERRITIN in the last 72 hours. Lab Results  Component Value Date   INR 1.28 06/15/2018   INR 1.13 05/28/2018   INR 1.24 04/09/2018   Studies/Results: No results found. Medications: . sodium chloride    . sodium chloride     . amLODipine  5 mg Oral Daily  . atorvastatin  40 mg Oral q1800  . calcium carbonate  1 tablet Oral Daily  . carvedilol  25 mg Oral BID  . Chlorhexidine Gluconate Cloth  6 each Topical Q0600  . clopidogrel  75 mg Oral Q breakfast  . diphenhydrAMINE  50 mg Oral QHS  . doxercalciferol  1 mcg Intravenous Q T,Th,Sa-HD  . feeding supplement  1 Container Oral TID BM  . feeding supplement (PRO-STAT SUGAR FREE 64)  30 mL Oral BID  . gabapentin  300 mg Oral QHS  . heparin  5,000 Units Subcutaneous Q8H  . hydrALAZINE  25 mg Oral Q8H  . lidocaine  1 patch Transdermal Q24H  . multivitamin  1 tablet Oral QHS  . nicotine  21 mg Transdermal Daily  . pantoprazole (PROTONIX) IV   40 mg Intravenous QHS  . promethazine  12.5 mg Intravenous Once  . traZODone  100 mg Oral QHS

## 2019-05-17 NOTE — Progress Notes (Signed)
PROGRESS NOTE    [name]  AYT:[id number] DOB: 1950-09-21 DOA: 05/12/2019 PCP: Bernerd Limbo, MD   Brief Narrative: See 68-year-old with past medical history significant for coronary artery disease a status post CABG and DES status post mitral valve replaced, end-stage renal disease on hemodialysis, A. fib status post maze procedure 2020 not on anticoagulation, chronic diastolic heart failure peripheral artery disease bilateral artery renal stent, left subclavian stent,AAA status post endovascular repair 2019 chronic pancreatitis, COPD on 2 L of oxygen right bundle branch block who presents complaining of recurrent abdominal pain epigastric with nausea and vomiting. Patient has a noted complicated history of pancreatitis since 2019 with recurrent issues.  Initially felt to be related to gallstone and underwent cholecystectomy but has had recurrent episodes since then.  She has had extensive work-up including endoscopy and EUS which was reportedly consistent with chronic pancreatitis and calcification.  Unremarkable ANA and IgG4 levels.  She has had MRCP and MRI which was unremarkable. Recent admission at [facility] on August 21 on July 24 for pancreatitis.  Her Creon was increased at that admission. Recent complaining of worsening abdominal pain.  CT abdomen show changes suggestive of pancreatitis involving the pancreatic head and surrounding inflammatory changes.    Assessment & Plan:   Principal Problem:   Acute on chronic pancreatitis (HCC) Active Problems:   Essential hypertension   Chronic diastolic heart failure (HCC)   COPD (chronic obstructive pulmonary disease) (HCC)   PAD (peripheral artery disease) (HCC)   Hyperlipidemia   CAD (coronary artery disease)   ESRD (end stage renal disease) ([place])   1-Recurrent acute idiopathic pancreatitis on chronic: Initially thought to be related to gallstone and underwent cholecystectomy without resolution of this episodes. Extensive  work-up as above. CT abdomen suggestive of acute pancreatitis. She will need to follow-up with her gastroenterologist for further options. Patient report some improvement in abdominal pain.  Tolerating full liquid diet.  She is willing to try low-fat diet.  Will resume Creon.  Will start oral pain medication and change IV to every 6 hours.  End-stage renal disease on hemodialysis: Appreciate nephrology team.  Continue with hemodialysis  GERD:; Continue with Protonix  Coronary artery disease status post CABG DES: Continue with carvedilol, Plavix and atorvastatin  A. fib post maze procedure: Sinus rhythm COPD: On home oxygen at night. Hypertension continue with Norvasc hydralazine and amlodipine Hyperlipidemia: Continue with atorvastatin Chronic diastolic heart failure; volume managed with dialysis Hypokalemia:     Nutrition Problem: Increased nutrient needs Etiology: (acute on chronic pancreatitis)    Signs/Symptoms: estimated needs    Interventions: Boost Breeze, Prostat  Estimated body mass index is 19 kg/m as calculated from the following:   Height as of this encounter: 5\' 5"  (1.651 m).   Weight as of this encounter: 51.8 kg.   DVT prophylaxis: Heparin Code Status: Full code Family Communication: None Disposition Plan: Continue inpatient due to persistent pain and requiring IV narcotics Consultants:   Nephrology  Procedures:   Hemodialysis  Antimicrobials:  None  Subjective: Patient report mild improvement of abdominal pain.  She really gets really bad pain when she wake up after being many hours without pain medications overnight.  She is going to advance diet today.  Objective: Vitals:   05/17/19 1000 05/17/19 1030 05/17/19 1100 05/17/19 1130  BP: (!) 138/59 (!) 165/59 (!) 120/49 (!) 156/67  Pulse: 63 67 62 68  Resp:    16  Temp:    98 F (36.7 C)  TempSrc:  Oral  SpO2:    92%  Weight:    51.8 kg  Height:        Intake/Output Summary (Last  24 hours) at 05/17/2019 1157 Last data filed at 05/17/2019 1130 Gross per 24 hour  Intake 357 ml  Output 1500 ml  Net -1143 ml   Filed Weights   05/16/19 2136 05/17/19 0750 05/17/19 1130  Weight: 55.5 kg 53.6 kg 51.8 kg    Examination:  General exam: No acute distress Respiratory system: Clear to auscultation Cardiovascular system: S1-S2 regular rhythm and rate Gastrointestinal system: Bowel sounds present, soft nontender nondistended Central nervous system: Alert and oriented Extremities: Symmetric power Skin: No rashes  Data Reviewed: I have personally reviewed following labs and imaging studies  CBC: Recent Labs  Lab 05/12/19 1941 05/13/19 0445 05/15/19 1631 05/16/19 0448 05/17/19 0810  WBC 12.6* 10.2 6.3 5.3 5.7  HGB 11.0* 9.3* 8.9* 8.6* 9.5*  HCT 34.9* 30.3* 28.0* 26.6* 30.4*  MCV 107.1* 109.0* 107.3* 104.3* 105.9*  PLT 318 267 217 183 371   Basic Metabolic Panel: Recent Labs  Lab 05/12/19 1941 05/13/19 0445 05/15/19 1631 05/16/19 0448 05/17/19 0810  NA 136 130* 138 134* 136  K 4.4 3.7 4.1 3.2* 3.7  CL 103 103 114* 104 107  CO2 21* 19* 17* 25 21*  GLUCOSE 114* 257* 124* 91 95  BUN 7* 8 14 6* 10  CREATININE 1.83* 1.97* 2.83* 1.61* 2.06*  CALCIUM 8.8* 7.5* 7.8* 7.5* 8.1*  PHOS  --   --  4.6  --  4.1   GFR: Estimated Creatinine Clearance: 22 mL/min (A) (by C-G formula based on SCr of 2.06 mg/dL (H)). Liver Function Tests: Recent Labs  Lab 05/12/19 1941 05/13/19 0445 05/15/19 1631 05/16/19 0448 05/17/19 0810  AST 19 17  --  14*  --   ALT 12 9  --  9  --   ALKPHOS 88 70  --  60  --   BILITOT 0.7 0.8  --  0.5  --   PROT 6.3* 5.2*  --  4.2*  --   ALBUMIN 3.1* 2.4* 2.0* 2.0* 2.2*   Recent Labs  Lab 05/12/19 1941 05/15/19 1631 05/16/19 0448  LIPASE 91* 54* 28   No results for input(s): AMMONIA in the last 168 hours. Coagulation Profile: No results for input(s): INR, PROTIME in the last 168 hours. Cardiac Enzymes: No results for input(s):  CKTOTAL, CKMB, CKMBINDEX, TROPONINI in the last 168 hours. BNP (last 3 results) No results for input(s): PROBNP in the last 8760 hours. HbA1C: No results for input(s): HGBA1C in the last 72 hours. CBG: No results for input(s): GLUCAP in the last 168 hours. Lipid Profile: No results for input(s): CHOL, HDL, LDLCALC, TRIG, CHOLHDL, LDLDIRECT in the last 72 hours. Thyroid Function Tests: No results for input(s): TSH, T4TOTAL, FREET4, T3FREE, THYROIDAB in the last 72 hours. Anemia Panel: No results for input(s): VITAMINB12, FOLATE, FERRITIN, TIBC, IRON, RETICCTPCT in the last 72 hours. Sepsis Labs: No results for input(s): PROCALCITON, LATICACIDVEN in the last 168 hours.  Recent Results (from the past 240 hour(s))  SARS CORONAVIRUS 2 (TAT 6-24 HRS) Nasopharyngeal Nasopharyngeal Swab     Status: None   Collection Time: 05/12/19  9:30 PM   Specimen: Nasopharyngeal Swab  Result Value Ref Range Status   SARS Coronavirus 2 NEGATIVE NEGATIVE Final    Comment: (NOTE) SARS-CoV-2 target nucleic acids are NOT DETECTED. The SARS-CoV-2 RNA is generally detectable in upper and lower respiratory specimens during the  acute phase of infection. Negative results do not preclude SARS-CoV-2 infection, do not rule out co-infections with other pathogens, and should not be used as the sole basis for treatment or other patient management decisions. Negative results must be combined with clinical observations, patient history, and epidemiological information. The expected result is Negative. Fact Sheet for Patients: SugarRoll.be Fact Sheet for Healthcare Providers: https://www.woods-mathews.com/ This test is not yet approved or cleared by the [place] FDA and  has been authorized for detection and/or diagnosis of SARS-CoV-2 by FDA under an Emergency Use Authorization (EUA). This EUA will remain  in effect (meaning this test can be used) for the duration of the  COVID-19 declaration under Section 56 4(b)(1) of the Act, 21 U.S.C. section 360bbb-3(b)(1), unless the authorization is terminated or revoked sooner. Performed at [facility], [place] [address], [place], [place] [address]   MRSA PCR Screening     Status: None   Collection Time: 05/13/19  3:28 PM   Specimen: Nasopharyngeal  Result Value Ref Range Status   MRSA by PCR NEGATIVE NEGATIVE Final    Comment:        The GeneXpert MRSA Assay (FDA approved for NASAL specimens only), is one component of a comprehensive MRSA colonization surveillance program. It is not intended to diagnose MRSA infection nor to guide or monitor treatment for MRSA infections. Performed at [facility], [place] [address] [place], [place] [address]          Radiology Studies: No results found.      Scheduled Meds: . amLODipine  5 mg Oral Daily  . atorvastatin  40 mg Oral q1800  . calcium carbonate  1 tablet Oral Daily  . carvedilol  25 mg Oral BID  . Chlorhexidine Gluconate Cloth  6 each Topical Q0600  . clopidogrel  75 mg Oral Q breakfast  . [START ON 05/22/2019] darbepoetin (ARANESP) injection - DIALYSIS  60 mcg Intravenous Q Tue-HD  . diphenhydrAMINE  50 mg Oral QHS  . doxercalciferol  1 mcg Intravenous Q T,Th,Sa-HD  . feeding supplement  1 Container Oral TID BM  . feeding supplement (PRO-STAT SUGAR FREE 64)  30 mL Oral BID  . ferric citrate  210 mg Oral TID WC  . gabapentin  300 mg Oral QHS  . heparin  5,000 Units Subcutaneous Q8H  . hydrALAZINE  25 mg Oral Q8H  . lidocaine  1 patch Transdermal Q24H  . lipase/protease/amylase  24,000 Units Oral TID AC  . multivitamin  1 tablet Oral QHS  . nicotine  21 mg Transdermal Daily  . pantoprazole (PROTONIX) IV  40 mg Intravenous QHS  . promethazine  12.5 mg Intravenous Once  . traZODone  100 mg Oral QHS   Continuous Infusions: . sodium chloride    . sodium chloride       LOS: 5 days    Time spent: 35 minutes     Elmarie Shiley, MD [facility] Pager [phone]  If 7PM-7AM, please contact night-coverage [email or web address] Password [email or web address] 05/17/2019, 11:57 AM

## 2019-05-18 MED ORDER — CREON 12000-38000 UNITS PO CPEP: 24000.0000 [IU] | ORAL_CAPSULE | Freq: Three times a day (TID) | ORAL | 0 refills | Status: DC

## 2019-05-18 MED ORDER — PANTOPRAZOLE SODIUM 40 MG PO TBEC: 40.0000 mg | DELAYED_RELEASE_TABLET | Freq: Every day | ORAL | Status: DC

## 2019-05-18 MED ORDER — NICOTINE 21 MG/24HR TD PT24: 21.0000 mg | MEDICATED_PATCH | Freq: Every day | TRANSDERMAL | 0 refills | Status: DC

## 2019-05-18 NOTE — Discharge Summary (Signed)
Physician Discharge Summary  [name] [name] IEP:[id number] DOB: [birth year] DOA: 05/12/2019  PCP: Bernerd Limbo, MD  Admit date: 05/12/2019 Discharge date: 05/18/2019  Admitted From: Home  Disposition; Home   Recommendations for Outpatient Follow-up:  1. Follow up with PCP in 1-2 weeks 2. Please obtain BMP/CBC in one week 3. Needs to follow up with primary Gastroenterologist for further options treatment for recurrent pancreatitis.   Home Health: none  Discharge Condition: Stable.  CODE STATUS: Full code Diet recommendation: Heart Healthy  Brief/Interim Summary: 68 year old with past medical history significant for coronary artery disease a status post CABG and DES status post mitral valve replaced, end-stage renal disease on hemodialysis, A. fib status post maze procedure 2020 not on anticoagulation, chronic diastolic heart failure peripheral artery disease bilateral artery renal stent, left subclavian stent,AAA status post endovascular repair 2019 chronic pancreatitis, COPD on 2 L of oxygen right bundle branch block who presents complaining of recurrent abdominal pain epigastric with nausea and vomiting. Patient has a noted complicated history of pancreatitis since 2019 with recurrent issues.  Initially felt to be related to gallstone and underwent cholecystectomy but has had recurrent episodes since then.  She has had extensive work-up including endoscopy and EUS which was reportedly consistent with chronic pancreatitis and calcification.  Unremarkable ANA and IgG4 levels.  She has had MRCP and MRI which was unremarkable. Recent admission at [facility] on August 21 on July 24 for pancreatitis.  Her Creon was increased at that admission. Recent complaining of worsening abdominal pain.  CT abdomen show changes suggestive of pancreatitis involving the pancreatic head and surrounding inflammatory changes.  1-Recurrent acute idiopathic pancreatitis on chronic: Initially thought to be related to  gallstone and underwent cholecystectomy without resolution of this episodes. Extensive work-up as above. CT abdomen suggestive of acute pancreatitis. She will need to follow-up with her gastroenterologist for further options. Patient report some improvement in abdominal pain.  Tolerating full liquid diet.  She is willing to try low-fat diet.  Will pain controlled, tolerating diet. Feels better, willing to go home.  Resume home dose dilaudid and zofran.  Needs to follow up with primary GI Physisician.  Resume Creon.    End-stage renal disease on hemodialysis: Appreciate nephrology team.  Continue with hemodialysis  GERD:; Continue with Protonix  Coronary artery disease status post CABG DES: Continue with carvedilol, Plavix and atorvastatin  A. fib post maze procedure: Sinus rhythm COPD: On home oxygen at night. Hypertension continue with Norvasc hydralazine and amlodipine Hyperlipidemia: Continue with atorvastatin Chronic diastolic heart failure; volume managed with dialysis Hypokalemia: replete with HD   Discharge Diagnoses:  Principal Problem:   Acute on chronic pancreatitis ([name]) Active Problems:   Essential hypertension   Chronic diastolic heart failure (HCC)   COPD (chronic obstructive pulmonary disease) (HCC)   PAD (peripheral artery disease) (HCC)   Hyperlipidemia   CAD (coronary artery disease)   ESRD (end stage renal disease) ([name])    Discharge Instructions  Discharge Instructions    Diet - low sodium heart healthy   Complete by: As directed    Increase activity slowly   Complete by: As directed      Allergies as of 05/18/2019      Reactions   Isosorbide Other (See Comments)   Can only tolerate in low doses (unknown reaction) Other reaction(s): Other Unknown "FELT LIKE I WAS GOING TO EXPLODE FROM INSIDE OUT"   Isosorbide Nitrate Other (See Comments)   Can only tolerate in low doses (unknown reaction)  Oxycodone-acetaminophen Itching   Tolerates  Hydrocodone.  Also tolerated oxycodone 07/13/18 admission.     Codeine Itching   Contrast Media [iodinated Diagnostic Agents] Itching, Other (See Comments)   Itching of feet   Ioxaglate Itching, Other (See Comments)   Itching of feet   Metrizamide Itching, Other (See Comments)   Itching of feet   Percocet [oxycodone-acetaminophen] Itching, Other (See Comments)   Tolerates Hydrocodone      Medication List    STOP taking these medications   acetaminophen 325 MG tablet Commonly known as: TYLENOL   ciprofloxacin 250 MG tablet Commonly known as: CIPRO   fenofibrate 160 MG tablet   loperamide 2 MG tablet Commonly known as: IMODIUM A-D     TAKE these medications   allopurinol 100 MG tablet Commonly known as: ZYLOPRIM Take 100 mg by mouth daily.   amLODipine 5 MG tablet Commonly known as: NORVASC Take 1 tablet (5 mg total) by mouth daily.   atorvastatin 40 MG tablet Commonly known as: LIPITOR Take 40 mg by mouth daily.   calcium carbonate 750 MG chewable tablet Commonly known as: TUMS EX Chew 2 tablets by mouth 3 (three) times daily as needed for heartburn.   carvedilol 25 MG tablet Commonly known as: COREG Take 25 mg by mouth 2 (two) times daily.   clopidogrel 75 MG tablet Commonly known as: PLAVIX Take 1 tablet (75 mg total) by mouth daily with breakfast.   Creon 12000 units Cpep capsule Generic drug: lipase/protease/amylase Take 2 capsules (24,000 Units total) by mouth 3 (three) times daily before meals. What changed:   how much to take  when to take this   diphenhydrAMINE 25 MG tablet Commonly known as: BENADRYL Take 50 mg by mouth at bedtime.   feeding supplement (PRO-STAT SUGAR FREE 64) Liqd Take 30 mLs by mouth 3 (three) times daily with meals.   feeding supplement Liqd Commonly known as: BOOST / RESOURCE BREEZE Take 1 Container by mouth 2 (two) times daily.   gabapentin 300 MG capsule Commonly known as: NEURONTIN Take 1 capsule (300 mg total)  by mouth at bedtime.   hydrALAZINE 25 MG tablet Commonly known as: APRESOLINE Take 1 tablet (25 mg total) by mouth every 8 (eight) hours.   HYDROmorphone 2 MG tablet Commonly known as: DILAUDID Take 2 mg by mouth every 4 (four) hours.   nicotine 21 mg/24hr patch Commonly known as: NICODERM CQ - dosed in mg/24 hours Place 1 patch (21 mg total) onto the skin daily. Start taking on: May 19, 2019   OXYGEN Inhale into the lungs at bedtime.   pantoprazole 40 MG tablet Commonly known as: PROTONIX Take 1 tablet (40 mg total) by mouth daily.   polyethylene glycol 17 g packet Commonly known as: MIRALAX / GLYCOLAX Take 17 g by mouth daily as needed for mild constipation.   ProAir RespiClick 850 (90 Base) MCG/ACT Aepb Generic drug: Albuterol Sulfate Inhale 1 puff into the lungs every 4 (four) hours as needed (shortness of breath/wheezing).   Prolensa 0.07 % Soln Generic drug: Bromfenac Sodium Place 2 drops into the left eye at bedtime.   promethazine 12.5 MG tablet Commonly known as: PHENERGAN Take 12.5 mg by mouth every 4 (four) hours as needed for nausea or vomiting.   traZODone 100 MG tablet Commonly known as: DESYREL Take 100 mg by mouth at bedtime.       Allergies  Allergen Reactions  . Isosorbide Other (See Comments)    Can only tolerate in low  doses (unknown reaction) Other reaction(s): Other Unknown "FELT LIKE I WAS GOING TO EXPLODE FROM INSIDE OUT"  . Isosorbide Nitrate Other (See Comments)    Can only tolerate in low doses (unknown reaction)  . Oxycodone-Acetaminophen Itching    Tolerates Hydrocodone.  Also tolerated oxycodone 07/13/18 admission.    . Codeine Itching  . Contrast Media [Iodinated Diagnostic Agents] Itching and Other (See Comments)    Itching of feet  . Ioxaglate Itching and Other (See Comments)    Itching of feet  . Metrizamide Itching and Other (See Comments)    Itching of feet  . Percocet [Oxycodone-Acetaminophen] Itching and Other  (See Comments)    Tolerates Hydrocodone    Consultations: nephrology  Procedures/Studies: Ct Abdomen Pelvis Wo Contrast  Result Date: 05/12/2019 CLINICAL DATA:  Abdominal pains, prior pancreatitis EXAM: CT ABDOMEN AND PELVIS WITHOUT CONTRAST TECHNIQUE: Multidetector CT imaging of the abdomen and pelvis was performed following the standard protocol without IV contrast. COMPARISON:  September 14, 2018 FINDINGS: Lower chest: The visualized heart size within normal limits. No pericardial fluid/thickening. No hiatal hernia. The visualized portions of the lungs are clear. Hepatobiliary: Although limited due to the lack of intravenous contrast, normal in appearance without gross focal abnormality. The patient is status post cholecystectomy. No biliary ductal dilation. Pancreas: The head of the pancreas appears to be enlarged with surrounding fat stranding changes seen at the pancreatic duodenal junction. There is mild dilatation of the first portion of the duodenum which is fluid-filled. The fat stranding changes extend inferiorly adjacent to the inferior hepatic border. No definite loculated fluid collection is seen. Spleen: Normal in size. Although limited due to the lack of intravenous contrast, normal in appearance. Adrenals/Urinary Tract: Both adrenal glands appear normal. The kidneys and collecting system appear normal without evidence of urinary tract calculus or hydronephrosis. Bladder is unremarkable. Dense renal vascular calcifications are seen. Stomach/Bowel: The stomach, small bowel, and colon are normal in appearance. No inflammatory changes or obstructive findings. appendix is normal. Vascular/Lymphatic: There are no enlarged abdominal or pelvic lymph nodes. The patient is status post aorto bi iliac stent graft. Reproductive: The patient is status post hysterectomy. No adnexal masses or collections seen. Other: No evidence of abdominal wall mass or hernia. Musculoskeletal: No acute or significant  osseous findings. IMPRESSION: Pancreatitis involving the pancreatic head and surrounding inflammatory changes involving the pancreatic duodenal junction. No definite loculated fluid collection. Electronically Signed   By: Prudencio Pair M.D.   On: 05/12/2019 21:25      Subjective: Feeling better. Tolerating diet.   Discharge Exam: Vitals:   05/18/19 0526 05/18/19 0907  BP: (!) 162/62 (!) 148/55  Pulse: (!) 58 60  Resp: 18 18  Temp: 98.6 F (37 C) 98.4 F (36.9 C)  SpO2: 92% 97%     General: Pt is alert, awake, not in acute distress Cardiovascular: RRR, S1/S2 +, no rubs, no gallops Respiratory: CTA bilaterally, no wheezing, no rhonchi Abdominal: Soft, NT, ND, bowel sounds + Extremities: no edema, no cyanosis    The results of significant diagnostics from this hospitalization (including imaging, microbiology, ancillary and laboratory) are listed below for reference.     Microbiology: Recent Results (from the past 240 hour(s))  SARS CORONAVIRUS 2 (TAT 6-24 HRS) Nasopharyngeal Nasopharyngeal Swab     Status: None   Collection Time: 05/12/19  9:30 PM   Specimen: Nasopharyngeal Swab  Result Value Ref Range Status   SARS Coronavirus 2 NEGATIVE NEGATIVE Final    Comment: (NOTE) SARS-CoV-2 target  nucleic acids are NOT DETECTED. The SARS-CoV-2 RNA is generally detectable in upper and lower respiratory specimens during the acute phase of infection. Negative results do not preclude SARS-CoV-2 infection, do not rule out co-infections with other pathogens, and should not be used as the sole basis for treatment or other patient management decisions. Negative results must be combined with clinical observations, patient history, and epidemiological information. The expected result is Negative. Fact Sheet for Patients: SugarRoll.be Fact Sheet for Healthcare Providers: https://www.woods-mathews.com/ This test is not yet approved or cleared by  the [place] FDA and  has been authorized for detection and/or diagnosis of SARS-CoV-2 by FDA under an Emergency Use Authorization (EUA). This EUA will remain  in effect (meaning this test can be used) for the duration of the COVID-19 declaration under Section 56 4(b)(1) of the Act, 21 U.S.C. section 360bbb-3(b)(1), unless the authorization is terminated or revoked sooner. Performed at [facility], [place] [address] [place], [place] [address]   MRSA PCR Screening     Status: None   Collection Time: 05/13/19  3:28 PM   Specimen: Nasopharyngeal  Result Value Ref Range Status   MRSA by PCR NEGATIVE NEGATIVE Final    Comment:        The GeneXpert MRSA Assay (FDA approved for NASAL specimens only), is one component of a comprehensive MRSA colonization surveillance program. It is not intended to diagnose MRSA infection nor to guide or monitor treatment for MRSA infections. Performed at [facility], [place] [address], [place], [place] [address]      Labs: BNP (last 3 results) Recent Labs    05/28/18 2325 08/14/18 2201  BNP 2,077.1* 1,740.8*   Basic Metabolic Panel: Recent Labs  Lab 05/12/19 1941 05/13/19 0445 05/15/19 1631 05/16/19 0448 05/17/19 0810  NA 136 130* 138 134* 136  K 4.4 3.7 4.1 3.2* 3.7  CL 103 103 114* 104 107  CO2 21* 19* 17* 25 21*  GLUCOSE 114* 257* 124* 91 95  BUN 7* 8 14 6* 10  CREATININE 1.83* 1.97* 2.83* 1.61* 2.06*  CALCIUM 8.8* 7.5* 7.8* 7.5* 8.1*  PHOS  --   --  4.6  --  4.1   Liver Function Tests: Recent Labs  Lab 05/12/19 1941 05/13/19 0445 05/15/19 1631 05/16/19 0448 05/17/19 0810  AST 19 17  --  14*  --   ALT 12 9  --  9  --   ALKPHOS 88 70  --  60  --   BILITOT 0.7 0.8  --  0.5  --   PROT 6.3* 5.2*  --  4.2*  --   ALBUMIN 3.1* 2.4* 2.0* 2.0* 2.2*   Recent Labs  Lab 05/12/19 1941 05/15/19 1631 05/16/19 0448  LIPASE 91* 54* 28   No results for input(s): AMMONIA in the last 168 hours. CBC: Recent Labs   Lab 05/12/19 1941 05/13/19 0445 05/15/19 1631 05/16/19 0448 05/17/19 0810  WBC 12.6* 10.2 6.3 5.3 5.7  HGB 11.0* 9.3* 8.9* 8.6* 9.5*  HCT 34.9* 30.3* 28.0* 26.6* 30.4*  MCV 107.1* 109.0* 107.3* 104.3* 105.9*  PLT 318 267 217 183 208   Cardiac Enzymes: No results for input(s): CKTOTAL, CKMB, CKMBINDEX, TROPONINI in the last 168 hours. BNP: Invalid input(s): POCBNP CBG: No results for input(s): GLUCAP in the last 168 hours. D-Dimer No results for input(s): DDIMER in the last 72 hours. Hgb A1c No results for input(s): HGBA1C in the last 72 hours. Lipid Profile No results for input(s): CHOL,  HDL, LDLCALC, TRIG, CHOLHDL, LDLDIRECT in the last 72 hours. Thyroid function studies No results for input(s): TSH, T4TOTAL, T3FREE, THYROIDAB in the last 72 hours.  Invalid input(s): FREET3 Anemia work up No results for input(s): VITAMINB12, FOLATE, FERRITIN, TIBC, IRON, RETICCTPCT in the last 72 hours. Urinalysis    Component Value Date/Time   COLORURINE AMBER (A) 09/10/2018 0328   APPEARANCEUR CLOUDY (A) 09/10/2018 0328   LABSPEC 1.025 09/28/2018 1934   PHURINE 6.0 09/28/2018 1934   GLUCOSEU NEGATIVE 09/28/2018 1934   HGBUR LARGE (A) 09/28/2018 1934   BILIRUBINUR MODERATE (A) 09/28/2018 1934   KETONESUR TRACE (A) 09/28/2018 1934   PROTEINUR >=300 (A) 09/28/2018 1934   UROBILINOGEN 1.0 09/28/2018 1934   NITRITE NEGATIVE 09/28/2018 1934   LEUKOCYTESUR LARGE (A) 09/28/2018 1934   Sepsis Labs Invalid input(s): PROCALCITONIN,  WBC,  LACTICIDVEN Microbiology Recent Results (from the past 240 hour(s))  SARS CORONAVIRUS 2 (TAT 6-24 HRS) Nasopharyngeal Nasopharyngeal Swab     Status: None   Collection Time: 05/12/19  9:30 PM   Specimen: Nasopharyngeal Swab  Result Value Ref Range Status   SARS Coronavirus 2 NEGATIVE NEGATIVE Final    Comment: (NOTE) SARS-CoV-2 target nucleic acids are NOT DETECTED. The SARS-CoV-2 RNA is generally detectable in upper and lower respiratory  specimens during the acute phase of infection. Negative results do not preclude SARS-CoV-2 infection, do not rule out co-infections with other pathogens, and should not be used as the sole basis for treatment or other patient management decisions. Negative results must be combined with clinical observations, patient history, and epidemiological information. The expected result is Negative. Fact Sheet for Patients: SugarRoll.be Fact Sheet for Healthcare Providers: https://www.woods-mathews.com/ This test is not yet approved or cleared by the [place] FDA and  has been authorized for detection and/or diagnosis of SARS-CoV-2 by FDA under an Emergency Use Authorization (EUA). This EUA will remain  in effect (meaning this test can be used) for the duration of the COVID-19 declaration under Section 56 4(b)(1) of the Act, 21 U.S.C. section 360bbb-3(b)(1), unless the authorization is terminated or revoked sooner. Performed at [facility], [place] [address] [place], [place] [address]   MRSA PCR Screening     Status: None   Collection Time: 05/13/19  3:28 PM   Specimen: Nasopharyngeal  Result Value Ref Range Status   MRSA by PCR NEGATIVE NEGATIVE Final    Comment:        The GeneXpert MRSA Assay (FDA approved for NASAL specimens only), is one component of a comprehensive MRSA colonization surveillance program. It is not intended to diagnose MRSA infection nor to guide or monitor treatment for MRSA infections. Performed at [facility], [place] [address], [place], [place] [address]      Time coordinating discharge: 40 minutes  SIGNED:   Elmarie Shiley, MD  [facility]

## 2019-05-18 NOTE — Plan of Care (Signed)
  Problem: Health Behavior/Discharge Planning: Goal: Ability to manage health-related needs will improve 05/18/2019 1201 by Dolores Hoose, RN Outcome: Adequate for Discharge 05/18/2019 0802 by Dolores Hoose, RN Outcome: Progressing   Problem: Clinical Measurements: Goal: Ability to maintain clinical measurements within normal limits will improve Outcome: Adequate for Discharge Goal: Will remain free from infection Outcome: Adequate for Discharge Goal: Diagnostic test results will improve Outcome: Adequate for Discharge Goal: Respiratory complications will improve Outcome: Adequate for Discharge Goal: Cardiovascular complication will be avoided Outcome: Adequate for Discharge   Problem: Activity: Goal: Risk for activity intolerance will decrease Outcome: Adequate for Discharge   Problem: Nutrition: Goal: Adequate nutrition will be maintained 05/18/2019 1201 by Dolores Hoose, RN Outcome: Adequate for Discharge 05/18/2019 0802 by Dolores Hoose, RN Outcome: Progressing   Problem: Coping: Goal: Level of anxiety will decrease Outcome: Adequate for Discharge   Problem: Elimination: Goal: Will not experience complications related to bowel motility Outcome: Adequate for Discharge   Problem: Pain Managment: Goal: General experience of comfort will improve 05/18/2019 1201 by Dolores Hoose, RN Outcome: Adequate for Discharge 05/18/2019 0802 by Dolores Hoose, RN Outcome: Progressing   Problem: Safety: Goal: Ability to remain free from injury will improve Outcome: Adequate for Discharge   Problem: Skin Integrity: Goal: Risk for impaired skin integrity will decrease Outcome: Adequate for Discharge

## 2019-05-18 NOTE — Plan of Care (Signed)
  Problem: Health Behavior/Discharge Planning: Goal: Ability to manage health-related needs will improve Outcome: Progressing   Problem: Nutrition: Goal: Adequate nutrition will be maintained Outcome: Progressing   Problem: Pain Managment: Goal: General experience of comfort will improve Outcome: Progressing   

## 2019-05-18 NOTE — Discharge Instructions (Signed)
Stop Smoking. Smoking increase your risk of pancreatitis.

## 2019-05-18 NOTE — Progress Notes (Signed)
Physical Therapy Treatment Patient Details Name: [name] MRN: [id number] DOB: [birth year] Today's Date: 05/18/2019    History of Present Illness Pt is a 68 y.o. F with significant PMH of CAD s/p CABG and DES, s/p mitral valve repair, ESRD on TTS HD, atrial fibrillation s/p Maze procedure, chronic diastolic HF, PAD, AAA s/p endovascular repair, chronic pancreatitis, COPD, HTN, who presents with epigastric pain associated with nausea and vomiting.     PT Comments    Pt was seen for mobility with gait on the hallway and then addressed vascular and disuse issues with stretch at her sink in the room.  Pt is responding well with good return on the demonstration, will be able to use these stretches at home.  Follow acutely for these gait and stretch interventions as long as pt is still requiring some assist, but may be ready to DC PT next week if still here.  Follow Up Recommendations  No PT follow up;Supervision - Intermittent     Equipment Recommendations  None recommended by PT    Recommendations for Other Services       Precautions / Restrictions Precautions Precautions: Fall Restrictions Weight Bearing Restrictions: No    Mobility  Bed Mobility Overal bed mobility: Modified Independent                Transfers Overall transfer level: Modified independent                  Ambulation/Gait Ambulation/Gait assistance: Min guard Gait Distance (Feet): 350 Feet Assistive device: None Gait Pattern/deviations: Step-through pattern;Decreased stride length;Wide base of support;Trunk flexed Gait velocity: decreased Gait velocity interpretation: <1.31 ft/sec, indicative of household ambulator General Gait Details: min guard due to leg and back symptoms, no LOB noted   Stairs Stairs: (per pt does not have any)           Wheelchair Mobility    Modified Rankin (Stroke Patients Only)       Balance Overall balance assessment: Needs  assistance Sitting-balance support: Feet supported Sitting balance-Leahy Scale: Good     Standing balance support: Single extremity supported Standing balance-Leahy Scale: Fair                              Cognition Arousal/Alertness: Awake/alert Behavior During Therapy: WFL for tasks assessed/performed Overall Cognitive Status: Within Functional Limits for tasks assessed                                        Exercises General Exercises - Lower Extremity Gluteal Sets: AROM;Standing(stretches) Toe Raises: AROM;15 reps    General Comments        Pertinent Vitals/Pain Pain Assessment: Faces Faces Pain Scale: Hurts little more Pain Location: back and lower legs Pain Descriptors / Indicators: Discomfort Pain Intervention(s): Limited activity within patient's tolerance;Monitored during session;Repositioned(rests and stretches)    Home Living                      Prior Function            PT Goals (current goals can now be found in the care plan section) Progress towards PT goals: Progressing toward goals    Frequency    Min 3X/week      PT Plan Current plan remains appropriate    Co-evaluation  AM-PAC PT "6 Clicks" Mobility   Outcome Measure  Help needed turning from your back to your side while in a flat bed without using bedrails?: None Help needed moving from lying on your back to sitting on the side of a flat bed without using bedrails?: None Help needed moving to and from a bed to a chair (including a wheelchair)?: None Help needed standing up from a chair using your arms (e.g., wheelchair or bedside chair)?: None Help needed to walk in hospital room?: A Little Help needed climbing 3-5 steps with a railing? : A Little 6 Click Score: 22    End of Session Equipment Utilized During Treatment: Gait belt Activity Tolerance: Patient tolerated treatment well Patient left: in bed;with call bell/phone  within reach(sitting on bed, to eat some breakfast) Nurse Communication: Mobility status PT Visit Diagnosis: Unsteadiness on feet (R26.81);Pain Pain - Right/Left: (both legs, back) Pain - part of body: Leg(back)     Time: 9147-8295 PT Time Calculation (min) (ACUTE ONLY): 21 min  Charges:  $Gait Training: 8-22 mins                     Ramond Dial 05/18/2019, 10:43 AM   [name], PT MS Acute Rehab Dept. Number: [place]

## 2019-05-18 NOTE — Progress Notes (Addendum)
DISCHARGE NOTE  [name] to be discharged Home per MD order. Patient verbalized understanding.  Skin clean, dry and intact without evidence of skin break down, no evidence of skin tears noted. IV catheter discontinued intact. Site without signs and symptoms of complications. Dressing and pressure applied. Pt denies pain at the site currently. No complaints noted.  Patient free of lines, drains, and wounds.   Discharge packet assembled. An After Visit Summary (AVS) was printed and given to the patient at bedside. Patient escorted via wheelchair and discharged to designated  family member  via private vehicle.  All questions and concerns addressed.   Dolores Hoose, RN

## 2019-05-18 NOTE — Progress Notes (Signed)
[facility] Progress Note   Subjective:  Seen in room, sleepy this morning. Denies CP/dyspnea, says abd pain improving and she is hoping for discharge today.  Objective Vitals:   05/17/19 1637 05/17/19 2137 05/18/19 0526 05/18/19 0907  BP: (!) 144/50 (!) 155/55 (!) 162/62 (!) 148/55  Pulse: 63 67 (!) 58 60  Resp: 18 18 18 18   Temp: 98.9 F (37.2 C) 98.8 F (37.1 C) 98.6 F (37 C) 98.4 F (36.9 C)  TempSrc: Oral Oral  Oral  SpO2: 96% 99% 92% 97%  Weight:  51.8 kg    Height:       Physical Exam General: Chronically ill woman, NAD Heart: RRR; no murmur Lungs: CTA anteriorly Abdomen: soft, non-tender Extremities: No LE edema Dialysis Access:  L AVF + thrill  Additional Objective Labs: Basic Metabolic Panel: Recent Labs  Lab 05/15/19 1631 05/16/19 0448 05/17/19 0810  NA 138 134* 136  K 4.1 3.2* 3.7  CL 114* 104 107  CO2 17* 25 21*  GLUCOSE 124* 91 95  BUN 14 6* 10  CREATININE 2.83* 1.61* 2.06*  CALCIUM 7.8* 7.5* 8.1*  PHOS 4.6  --  4.1   Liver Function Tests: Recent Labs  Lab 05/12/19 1941 05/13/19 0445 05/15/19 1631 05/16/19 0448 05/17/19 0810  AST 19 17  --  14*  --   ALT 12 9  --  9  --   ALKPHOS 88 70  --  60  --   BILITOT 0.7 0.8  --  0.5  --   PROT 6.3* 5.2*  --  4.2*  --   ALBUMIN 3.1* 2.4* 2.0* 2.0* 2.2*   Recent Labs  Lab 05/12/19 1941 05/15/19 1631 05/16/19 0448  LIPASE 91* 54* 28   CBC: Recent Labs  Lab 05/12/19 1941 05/13/19 0445 05/15/19 1631 05/16/19 0448 05/17/19 0810  WBC 12.6* 10.2 6.3 5.3 5.7  HGB 11.0* 9.3* 8.9* 8.6* 9.5*  HCT 34.9* 30.3* 28.0* 26.6* 30.4*  MCV 107.1* 109.0* 107.3* 104.3* 105.9*  PLT 318 267 217 183 208   Medications:  . amLODipine  5 mg Oral Daily  . atorvastatin  40 mg Oral q1800  . calcium carbonate  1 tablet Oral Daily  . carvedilol  25 mg Oral BID  . Chlorhexidine Gluconate Cloth  6 each Topical Q0600  . clopidogrel  75 mg Oral Q breakfast  . [START ON 05/22/2019] darbepoetin  (ARANESP) injection - DIALYSIS  60 mcg Intravenous Q Tue-HD  . diphenhydrAMINE  50 mg Oral QHS  . doxercalciferol  1 mcg Intravenous Q T,Th,Sa-HD  . feeding supplement  1 Container Oral TID BM  . feeding supplement (PRO-STAT SUGAR FREE 64)  30 mL Oral BID  . ferric citrate  210 mg Oral TID WC  . gabapentin  300 mg Oral QHS  . heparin  5,000 Units Subcutaneous Q8H  . hydrALAZINE  25 mg Oral Q8H  . lidocaine  1 patch Transdermal Q24H  . lipase/protease/amylase  24,000 Units Oral TID AC  . multivitamin  1 tablet Oral QHS  . nicotine  21 mg Transdermal Daily  . pantoprazole  40 mg Oral QHS  . promethazine  12.5 mg Intravenous Once  . traZODone  100 mg Oral QHS    Dialysis Orders: [facility] 4 hr 350/800 EDW 51.5 3K 2.25 Ca left upper AVF heparin 2200  - Mircera 75 q 2 weeks (last 9/1), hectorol 1 Recent labs: hgb 9.5 gradual decline 38% sat Ca/P ok iPTH 223 Hgb A1c 5.2  Assessment/Plan: 1. Acute on chronic pancreatitis: Per primary, very low fat diet recommended. 2. ESRD: Continue HD per TTS schedule - next 9/12 if still here. Lower HD time to 3:30 on discharge. 3. Hypertension/volume: BP stable, close to outpatient EDW at this time. 4. Anemia: Hgb 9.5 - not due for ESA yet, follow. 5. Metabolic bone disease: Labs ok. Continue hectorol/resumed Aurixya binder 6. Nutrition: Alb low, losing weight. Continue protein supplements. 7. ASCVD s/p AAA/stent/CABG, left CEA, RA stents 8. DM-well controlled - last A1c 5.2 - per primary  Veneta Penton, PA-C 05/18/2019, 10:22 AM  [facility] Pager: [phone]

## 2019-06-01 ENCOUNTER — Other Ambulatory Visit: Payer: Self-pay | Admitting: Family Medicine

## 2019-06-01 DIAGNOSIS — Z1231 Encounter for screening mammogram for malignant neoplasm of breast: Secondary | ICD-10-CM

## 2019-07-12 ENCOUNTER — Encounter (HOSPITAL_COMMUNITY): Payer: Self-pay | Admitting: Emergency Medicine

## 2019-07-12 ENCOUNTER — Emergency Department (HOSPITAL_COMMUNITY)
Admission: EM | Admit: 2019-07-12 | Discharge: 2019-07-12 | Discharge status: Against Medical Advice | Payer: Medicare Other | Attending: Emergency Medicine | Admitting: Emergency Medicine

## 2019-07-12 DIAGNOSIS — Z5321 Procedure and treatment not carried out due to patient leaving prior to being seen by health care provider: Secondary | ICD-10-CM | POA: Insufficient documentation

## 2019-07-12 DIAGNOSIS — R109 Unspecified abdominal pain: Secondary | ICD-10-CM | POA: Diagnosis present

## 2019-07-12 LAB — COMPREHENSIVE METABOLIC PANEL
ALT: 9 U/L (ref 0–44)
AST: 15 U/L (ref 15–41)
Albumin: 2.7 g/dL — ABNORMAL LOW (ref 3.5–5.0)
Alkaline Phosphatase: 90 U/L (ref 38–126)
Anion gap: 13 (ref 5–15)
BUN: 11 mg/dL (ref 8–23)
CO2: 23 mmol/L (ref 22–32)
Calcium: 8.3 mg/dL — ABNORMAL LOW (ref 8.9–10.3)
Chloride: 103 mmol/L (ref 98–111)
Creatinine, Ser: 2.8 mg/dL — ABNORMAL HIGH (ref 0.44–1.00)
GFR calc Af Amer: 20 mL/min — ABNORMAL LOW (ref >60)
GFR calc non Af Amer: 17 mL/min — ABNORMAL LOW (ref >60)
Glucose, Bld: 98 mg/dL (ref 70–99)
Potassium: 4.3 mmol/L (ref 3.5–5.1)
Sodium: 139 mmol/L (ref 135–145)
Total Bilirubin: 0.9 mg/dL (ref 0.3–1.2)
Total Protein: 5.3 g/dL — ABNORMAL LOW (ref 6.5–8.1)

## 2019-07-12 LAB — CBC
HCT: 38.9 % (ref 36.0–46.0)
Hemoglobin: 12.4 g/dL (ref 12.0–15.0)
MCH: 32.2 pg (ref 26.0–34.0)
MCHC: 31.9 g/dL (ref 30.0–36.0)
MCV: 101 fL — ABNORMAL HIGH (ref 80.0–100.0)
Platelets: 207 10*3/uL (ref 150–400)
RBC: 3.85 MIL/uL — ABNORMAL LOW (ref 3.87–5.11)
RDW: 16 % — ABNORMAL HIGH (ref 11.5–15.5)
WBC: 8.7 10*3/uL (ref 4.0–10.5)
nRBC: 0 % (ref 0.0–0.2)

## 2019-07-12 LAB — LIPASE, BLOOD: Lipase: 75 U/L — ABNORMAL HIGH (ref 11–51)

## 2019-07-12 MED ORDER — SODIUM CHLORIDE 0.9% FLUSH: 3.0000 mL | Freq: Once | INTRAVENOUS | Status: DC

## 2019-07-12 NOTE — ED Notes (Addendum)
Pt walked past this tech stating that she "called a taxi and I'm going home." This tech told pt that she should stay and get looked at. Pt stated that she "has pain meds at home and can treat herself. I'll come back tomorrow." RN, Mortimer Fries, notified.

## 2019-07-12 NOTE — ED Triage Notes (Signed)
Pt here from home with c/o abd paain , pt received 167mcg of fentanyl by ems , pt has hx of pancreatitis

## 2019-07-26 ENCOUNTER — Other Ambulatory Visit: Payer: Self-pay

## 2019-07-26 ENCOUNTER — Inpatient Hospital Stay (HOSPITAL_COMMUNITY): Payer: Medicare Other

## 2019-07-26 ENCOUNTER — Encounter (HOSPITAL_COMMUNITY): Payer: Self-pay | Admitting: Emergency Medicine

## 2019-07-26 ENCOUNTER — Emergency Department (HOSPITAL_COMMUNITY): Payer: Medicare Other

## 2019-07-26 ENCOUNTER — Inpatient Hospital Stay (HOSPITAL_COMMUNITY)
Admission: EM | Admit: 2019-07-26 | Discharge: 2019-07-30 | DRG: 438 | Disposition: A | Discharge status: Home / Self Care | Payer: Medicare Other | Attending: Internal Medicine | Admitting: Internal Medicine

## 2019-07-26 DIAGNOSIS — I5032 Chronic diastolic (congestive) heart failure: Secondary | ICD-10-CM | POA: Diagnosis present

## 2019-07-26 DIAGNOSIS — R112 Nausea with vomiting, unspecified: Secondary | ICD-10-CM

## 2019-07-26 DIAGNOSIS — K219 Gastro-esophageal reflux disease without esophagitis: Secondary | ICD-10-CM | POA: Diagnosis present

## 2019-07-26 DIAGNOSIS — I6523 Occlusion and stenosis of bilateral carotid arteries: Secondary | ICD-10-CM | POA: Diagnosis present

## 2019-07-26 DIAGNOSIS — Z9049 Acquired absence of other specified parts of digestive tract: Secondary | ICD-10-CM | POA: Diagnosis not present

## 2019-07-26 DIAGNOSIS — Z992 Dependence on renal dialysis: Secondary | ICD-10-CM | POA: Diagnosis not present

## 2019-07-26 DIAGNOSIS — Z951 Presence of aortocoronary bypass graft: Secondary | ICD-10-CM | POA: Diagnosis not present

## 2019-07-26 DIAGNOSIS — E1151 Type 2 diabetes mellitus with diabetic peripheral angiopathy without gangrene: Secondary | ICD-10-CM | POA: Diagnosis present

## 2019-07-26 DIAGNOSIS — K861 Other chronic pancreatitis: Secondary | ICD-10-CM | POA: Diagnosis not present

## 2019-07-26 DIAGNOSIS — K858 Other acute pancreatitis without necrosis or infection: Principal | ICD-10-CM | POA: Diagnosis present

## 2019-07-26 DIAGNOSIS — Z885 Allergy status to narcotic agent status: Secondary | ICD-10-CM

## 2019-07-26 DIAGNOSIS — Z888 Allergy status to other drugs, medicaments and biological substances status: Secondary | ICD-10-CM | POA: Diagnosis not present

## 2019-07-26 DIAGNOSIS — N2581 Secondary hyperparathyroidism of renal origin: Secondary | ICD-10-CM | POA: Diagnosis present

## 2019-07-26 DIAGNOSIS — Z8679 Personal history of other diseases of the circulatory system: Secondary | ICD-10-CM

## 2019-07-26 DIAGNOSIS — I132 Hypertensive heart and chronic kidney disease with heart failure and with stage 5 chronic kidney disease, or end stage renal disease: Secondary | ICD-10-CM | POA: Diagnosis present

## 2019-07-26 DIAGNOSIS — Z8349 Family history of other endocrine, nutritional and metabolic diseases: Secondary | ICD-10-CM

## 2019-07-26 DIAGNOSIS — I4510 Unspecified right bundle-branch block: Secondary | ICD-10-CM | POA: Diagnosis present

## 2019-07-26 DIAGNOSIS — I10 Essential (primary) hypertension: Secondary | ICD-10-CM | POA: Diagnosis present

## 2019-07-26 DIAGNOSIS — Z9981 Dependence on supplemental oxygen: Secondary | ICD-10-CM

## 2019-07-26 DIAGNOSIS — G8929 Other chronic pain: Secondary | ICD-10-CM | POA: Diagnosis present

## 2019-07-26 DIAGNOSIS — E785 Hyperlipidemia, unspecified: Secondary | ICD-10-CM | POA: Diagnosis present

## 2019-07-26 DIAGNOSIS — Z9841 Cataract extraction status, right eye: Secondary | ICD-10-CM

## 2019-07-26 DIAGNOSIS — J449 Chronic obstructive pulmonary disease, unspecified: Secondary | ICD-10-CM | POA: Diagnosis present

## 2019-07-26 DIAGNOSIS — I252 Old myocardial infarction: Secondary | ICD-10-CM

## 2019-07-26 DIAGNOSIS — D631 Anemia in chronic kidney disease: Secondary | ICD-10-CM | POA: Diagnosis present

## 2019-07-26 DIAGNOSIS — Z79899 Other long term (current) drug therapy: Secondary | ICD-10-CM

## 2019-07-26 DIAGNOSIS — K859 Acute pancreatitis without necrosis or infection, unspecified: Secondary | ICD-10-CM | POA: Diagnosis present

## 2019-07-26 DIAGNOSIS — Z90710 Acquired absence of both cervix and uterus: Secondary | ICD-10-CM | POA: Diagnosis not present

## 2019-07-26 DIAGNOSIS — I480 Paroxysmal atrial fibrillation: Secondary | ICD-10-CM | POA: Diagnosis present

## 2019-07-26 DIAGNOSIS — N186 End stage renal disease: Secondary | ICD-10-CM | POA: Diagnosis present

## 2019-07-26 DIAGNOSIS — Z833 Family history of diabetes mellitus: Secondary | ICD-10-CM

## 2019-07-26 DIAGNOSIS — Z8249 Family history of ischemic heart disease and other diseases of the circulatory system: Secondary | ICD-10-CM

## 2019-07-26 DIAGNOSIS — Z91041 Radiographic dye allergy status: Secondary | ICD-10-CM | POA: Diagnosis not present

## 2019-07-26 DIAGNOSIS — G4733 Obstructive sleep apnea (adult) (pediatric): Secondary | ICD-10-CM | POA: Diagnosis present

## 2019-07-26 DIAGNOSIS — Z20828 Contact with and (suspected) exposure to other viral communicable diseases: Secondary | ICD-10-CM | POA: Diagnosis present

## 2019-07-26 DIAGNOSIS — E1122 Type 2 diabetes mellitus with diabetic chronic kidney disease: Secondary | ICD-10-CM | POA: Diagnosis present

## 2019-07-26 DIAGNOSIS — E8889 Other specified metabolic disorders: Secondary | ICD-10-CM | POA: Diagnosis present

## 2019-07-26 DIAGNOSIS — F329 Major depressive disorder, single episode, unspecified: Secondary | ICD-10-CM | POA: Diagnosis not present

## 2019-07-26 DIAGNOSIS — J4489 Other specified chronic obstructive pulmonary disease: Secondary | ICD-10-CM | POA: Diagnosis present

## 2019-07-26 DIAGNOSIS — Z825 Family history of asthma and other chronic lower respiratory diseases: Secondary | ICD-10-CM

## 2019-07-26 DIAGNOSIS — I25710 Atherosclerosis of autologous vein coronary artery bypass graft(s) with unstable angina pectoris: Secondary | ICD-10-CM | POA: Diagnosis present

## 2019-07-26 DIAGNOSIS — F17210 Nicotine dependence, cigarettes, uncomplicated: Secondary | ICD-10-CM | POA: Diagnosis present

## 2019-07-26 DIAGNOSIS — I4891 Unspecified atrial fibrillation: Secondary | ICD-10-CM | POA: Diagnosis present

## 2019-07-26 DIAGNOSIS — IMO0002 Reserved for concepts with insufficient information to code with codable children: Secondary | ICD-10-CM | POA: Diagnosis present

## 2019-07-26 DIAGNOSIS — Z955 Presence of coronary angioplasty implant and graft: Secondary | ICD-10-CM

## 2019-07-26 DIAGNOSIS — I2510 Atherosclerotic heart disease of native coronary artery without angina pectoris: Secondary | ICD-10-CM | POA: Diagnosis present

## 2019-07-26 LAB — SARS CORONAVIRUS 2 (TAT 6-24 HRS): SARS Coronavirus 2: NEGATIVE

## 2019-07-26 LAB — CBC
HCT: 38.6 % (ref 36.0–46.0)
Hemoglobin: 12.9 g/dL (ref 12.0–15.0)
MCH: 32.3 pg (ref 26.0–34.0)
MCHC: 33.4 g/dL (ref 30.0–36.0)
MCV: 96.7 fL (ref 80.0–100.0)
Platelets: 273 10*3/uL (ref 150–400)
RBC: 3.99 MIL/uL (ref 3.87–5.11)
RDW: 15.8 % — ABNORMAL HIGH (ref 11.5–15.5)
WBC: 11.7 10*3/uL — ABNORMAL HIGH (ref 4.0–10.5)
nRBC: 0 % (ref 0.0–0.2)

## 2019-07-26 LAB — COMPREHENSIVE METABOLIC PANEL
ALT: 11 U/L (ref 0–44)
AST: 18 U/L (ref 15–41)
Albumin: 2.7 g/dL — ABNORMAL LOW (ref 3.5–5.0)
Alkaline Phosphatase: 80 U/L (ref 38–126)
Anion gap: 14 (ref 5–15)
BUN: 6 mg/dL — ABNORMAL LOW (ref 8–23)
CO2: 24 mmol/L (ref 22–32)
Calcium: 8.1 mg/dL — ABNORMAL LOW (ref 8.9–10.3)
Chloride: 99 mmol/L (ref 98–111)
Creatinine, Ser: 1.58 mg/dL — ABNORMAL HIGH (ref 0.44–1.00)
GFR calc Af Amer: 39 mL/min — ABNORMAL LOW (ref >60)
GFR calc non Af Amer: 34 mL/min — ABNORMAL LOW (ref >60)
Glucose, Bld: 119 mg/dL — ABNORMAL HIGH (ref 70–99)
Potassium: 3.6 mmol/L (ref 3.5–5.1)
Sodium: 137 mmol/L (ref 135–145)
Total Bilirubin: 0.8 mg/dL (ref 0.3–1.2)
Total Protein: 5.6 g/dL — ABNORMAL LOW (ref 6.5–8.1)

## 2019-07-26 LAB — URINALYSIS, ROUTINE W REFLEX MICROSCOPIC
Bilirubin Urine: NEGATIVE
Glucose, UA: NEGATIVE mg/dL
Hgb urine dipstick: NEGATIVE
Ketones, ur: NEGATIVE mg/dL
Nitrite: NEGATIVE
Protein, ur: 30 mg/dL — AB
Specific Gravity, Urine: 1.023 (ref 1.005–1.030)
pH: 5 (ref 5.0–8.0)

## 2019-07-26 LAB — TROPONIN I (HIGH SENSITIVITY): Troponin I (High Sensitivity): 22 ng/L — ABNORMAL HIGH (ref <18)

## 2019-07-26 LAB — LIPASE, BLOOD: Lipase: 152 U/L — ABNORMAL HIGH (ref 11–51)

## 2019-07-26 MED ORDER — SODIUM CHLORIDE 0.9 % IV SOLN
Freq: Once | INTRAVENOUS | Status: AC
  Administered 2019-07-26: 20:00:00 via INTRAVENOUS

## 2019-07-26 MED ORDER — AMLODIPINE BESYLATE 5 MG PO TABS
5.0000 mg | ORAL_TABLET | Freq: Every day | ORAL | Status: DC
  Administered 2019-07-27 – 2019-07-30 (×4): 5 mg via ORAL
  Filled 2019-07-26 (×4): qty 1

## 2019-07-26 MED ORDER — HYDROMORPHONE HCL 1 MG/ML IJ SOLN
1.5000 mg | Freq: Once | INTRAMUSCULAR | Status: AC
  Administered 2019-07-26: 1.5 mg via INTRAVENOUS
  Filled 2019-07-26: qty 2

## 2019-07-26 MED ORDER — GABAPENTIN 300 MG PO CAPS
300.0000 mg | ORAL_CAPSULE | Freq: Every day | ORAL | Status: DC
  Administered 2019-07-27 – 2019-07-29 (×4): 300 mg via ORAL
  Filled 2019-07-26 (×4): qty 1

## 2019-07-26 MED ORDER — HYDRALAZINE HCL 25 MG PO TABS
25.0000 mg | ORAL_TABLET | Freq: Three times a day (TID) | ORAL | Status: DC
  Administered 2019-07-27 – 2019-07-30 (×10): 25 mg via ORAL
  Filled 2019-07-26 (×10): qty 1

## 2019-07-26 MED ORDER — ACETAMINOPHEN 650 MG RE SUPP: 650.0000 mg | Freq: Four times a day (QID) | RECTAL | Status: DC | PRN

## 2019-07-26 MED ORDER — ONDANSETRON HCL 4 MG/2ML IJ SOLN
4.0000 mg | Freq: Four times a day (QID) | INTRAMUSCULAR | Status: DC | PRN
  Administered 2019-07-27 – 2019-07-29 (×3): 4 mg via INTRAVENOUS
  Filled 2019-07-26 (×3): qty 2

## 2019-07-26 MED ORDER — CALCIUM CARBONATE ANTACID 500 MG PO CHEW: 4.0000 | CHEWABLE_TABLET | Freq: Three times a day (TID) | ORAL | Status: DC | PRN

## 2019-07-26 MED ORDER — CARVEDILOL 25 MG PO TABS
25.0000 mg | ORAL_TABLET | Freq: Two times a day (BID) | ORAL | Status: DC
  Administered 2019-07-27 – 2019-07-30 (×7): 25 mg via ORAL
  Filled 2019-07-26 (×7): qty 1

## 2019-07-26 MED ORDER — ACETAMINOPHEN 325 MG PO TABS
650.0000 mg | ORAL_TABLET | Freq: Four times a day (QID) | ORAL | Status: DC | PRN
  Administered 2019-07-27: 650 mg via ORAL
  Filled 2019-07-26: qty 2

## 2019-07-26 MED ORDER — PANTOPRAZOLE SODIUM 40 MG PO TBEC
40.0000 mg | DELAYED_RELEASE_TABLET | Freq: Every day | ORAL | Status: DC
  Administered 2019-07-27 – 2019-07-30 (×4): 40 mg via ORAL
  Filled 2019-07-26 (×4): qty 1

## 2019-07-26 MED ORDER — SODIUM CHLORIDE 0.9% FLUSH
3.0000 mL | Freq: Once | INTRAVENOUS | Status: AC
  Administered 2019-07-26: 3 mL via INTRAVENOUS

## 2019-07-26 MED ORDER — SODIUM CHLORIDE 0.9 % IV BOLUS
500.0000 mL | Freq: Once | INTRAVENOUS | Status: AC
  Administered 2019-07-26: 500 mL via INTRAVENOUS

## 2019-07-26 MED ORDER — PROMETHAZINE HCL 25 MG/ML IJ SOLN
12.5000 mg | Freq: Once | INTRAMUSCULAR | Status: AC
  Administered 2019-07-26: 12.5 mg via INTRAVENOUS
  Filled 2019-07-26: qty 1

## 2019-07-26 MED ORDER — HEPARIN SODIUM (PORCINE) 5000 UNIT/ML IJ SOLN
5000.0000 [IU] | Freq: Three times a day (TID) | INTRAMUSCULAR | Status: DC
  Administered 2019-07-27: 5000 [IU] via SUBCUTANEOUS
  Filled 2019-07-26 (×6): qty 1

## 2019-07-26 MED ORDER — ALBUTEROL SULFATE (2.5 MG/3ML) 0.083% IN NEBU: 2.5000 mg | INHALATION_SOLUTION | RESPIRATORY_TRACT | Status: DC | PRN

## 2019-07-26 MED ORDER — HYDROMORPHONE HCL 1 MG/ML IJ SOLN
1.0000 mg | Freq: Once | INTRAMUSCULAR | Status: AC
  Administered 2019-07-26: 1 mg via INTRAVENOUS
  Filled 2019-07-26: qty 1

## 2019-07-26 MED ORDER — HYDROMORPHONE HCL 1 MG/ML IJ SOLN
0.5000 mg | INTRAMUSCULAR | Status: DC | PRN
  Administered 2019-07-26: 0.5 mg via INTRAVENOUS
  Filled 2019-07-26: qty 1

## 2019-07-26 MED ORDER — PANCRELIPASE (LIP-PROT-AMYL) 12000-38000 UNITS PO CPEP
24000.0000 [IU] | ORAL_CAPSULE | Freq: Three times a day (TID) | ORAL | Status: DC
  Administered 2019-07-27 – 2019-07-30 (×10): 24000 [IU] via ORAL
  Filled 2019-07-26 (×12): qty 2

## 2019-07-26 MED ORDER — HYDROMORPHONE HCL 1 MG/ML IJ SOLN
0.5000 mg | INTRAMUSCULAR | Status: DC | PRN
  Administered 2019-07-27 (×9): 0.5 mg via INTRAVENOUS
  Filled 2019-07-26 (×9): qty 1

## 2019-07-26 MED ORDER — ALLOPURINOL 100 MG PO TABS
100.0000 mg | ORAL_TABLET | Freq: Every day | ORAL | Status: DC
  Administered 2019-07-27 – 2019-07-30 (×4): 100 mg via ORAL
  Filled 2019-07-26 (×4): qty 1

## 2019-07-26 MED ORDER — ONDANSETRON HCL 4 MG PO TABS
4.0000 mg | ORAL_TABLET | Freq: Four times a day (QID) | ORAL | Status: DC | PRN
  Administered 2019-07-29: 4 mg via ORAL
  Filled 2019-07-26: qty 1

## 2019-07-26 MED ORDER — TRAZODONE HCL 50 MG PO TABS
150.0000 mg | ORAL_TABLET | Freq: Every day | ORAL | Status: DC
  Administered 2019-07-27 – 2019-07-29 (×4): 150 mg via ORAL
  Filled 2019-07-26 (×4): qty 1

## 2019-07-26 MED ORDER — ATORVASTATIN CALCIUM 40 MG PO TABS
40.0000 mg | ORAL_TABLET | Freq: Every day | ORAL | Status: DC
  Administered 2019-07-27 – 2019-07-30 (×4): 40 mg via ORAL
  Filled 2019-07-26 (×4): qty 1

## 2019-07-26 NOTE — ED Triage Notes (Signed)
Pt arrives via EMS from home with right upper quadrant pain, burping, hx of pancreatitis. States pain feels similar to previous episodes. VSS. Pt appears in distress due to pain.

## 2019-07-26 NOTE — H&P (Signed)
History and Physical    [name] OZD:[id number] DOB: 10/02/1950 DOA: 07/26/2019  PCP: Bernerd Limbo, MD  Patient coming from: Home.  Chief Complaint: Abdominal pain nausea vomiting.  HPI: [name] is a 68 y.o. female with history of chronic pancreatitis of unknown cause, ESRD on hemodialysis on Tuesday Thursday Saturday, CAD status post CABG, abdominal aortic aneurysm COPD diabetes mellitus of medications presents to the ER because of ongoing pain in the abdomen over the last 4 days.  Pain is in the epigastric area radiating to the right upper quadrant.  Had multiple episodes of vomiting.  Had 1 episode of diarrhea.  Denies any chest pain fever chills or shortness of breath.  Had missed her dialysis on Tuesday but did have HD today but as per the patient it was a short of session.  Due to worsening pain patient presented to the ER.  ED Course: The ER patient had required multiple doses of Dilaudid for pain relief.  Chest x-ray was repeated twice to make sure there was no pneumothorax.  Labs show lipase of 152 LFTs were showing normal AST ALT and total bilirubin.  High-sensitivity troponin was 22 EKG was showing normal sinus rhythm with right bundle branch block.  WBC was 11.7.  KUB does not show anything acute.  Review of Systems: As per HPI, rest all negative.   Past Medical History:  Diagnosis Date  . AAA (abdominal aortic aneurysm) ([place]) 5/09   3.7x3.3 by u/s 2009  . Abnormal EKG    deep TW inversions chronic  . Anemia   . CAD (coronary artery disease)    a. CABG 03/2011 LIMA to LAD, SVG to OM, SVG to PDA. b. cath 06/25/2015 DES to SVG to rPDA, patent LIMA to LAD, patent SVG to OM  . carotid stenosis 5/09   S/p L CEA ;  60-79% bilat ICA by preCABG dopplers 7/12  . CHF (congestive heart failure) ([place])   . Chronic back pain    "all over" (06/24/2015)  . Chronic bronchitis ([place])    "get it pretty much q yr" (06/24/2015)  . CKD (chronic kidney disease)   . Complication of  anesthesia 1966   "problem w/ether"  . COPD (chronic obstructive pulmonary disease) ([place])   . Depression   . GERD (gastroesophageal reflux disease)    With hiatal hernia  . GIB (gastrointestinal bleeding) 9/11   S/p EGD with cautery at [facility]  . Heart murmur   . History of blood transfusion "a few times"   "all related to anemia" (06/24/2015)  . History of hiatal hernia   . Hyperlipidemia   . Hypertension   . Mitral regurgitation    3+ MR by intraoperative TEE;  s/p MV repair with Dr. Roxy Manns 7/12  . Myocardial infarction [facility]) 2012 "several"  . On home oxygen therapy    "2 liters at night; negative for sleep apnea"  . PAD (peripheral artery disease) (HCC)    Severe; s/p bilateral renal artery stents, moderate in-stent restenosis  . Pancreatitis 05/2018  . Paroxysmal atrial fibrillation (HCC)    coumadin;  echo 9/07: EF 60%, mild LVH;  s/p Cox Maze 7/12 with LAA clipping  . Subclavian artery stenosis, left (HCC)    stented by Dr. Trula Slade on 10/18 to help flow of her LIMA to LAD  . Type II diabetes mellitus ([place])     Past Surgical History:  Procedure Laterality Date  . A/V FISTULAGRAM Left 03/21/2018   Procedure: A/V FISTULAGRAM;  Surgeon: Serafina Mitchell, MD;  Location: [place] [facility];  Service: Cardiovascular;  Laterality: Left;  . ABDOMINAL AORTIC ENDOVASCULAR STENT GRAFT N/A 01/17/2018   Procedure: ABDOMINAL AORTIC ENDOVASCULAR STENT GRAFT WITH CO2;  Surgeon: Serafina Mitchell, MD;  Location: [place];  Service: Vascular;  Laterality: N/A;  . ABDOMINAL HYSTERECTOMY  1990's  . APPENDECTOMY  Aug. 11, 2016   Ruptured  . AV FISTULA PLACEMENT Left 11/03/2017   Procedure: ARTERIOVENOUS (AV) FISTULA CREATION LEFT ARM;  Surgeon: Serafina Mitchell, MD;  Location: [place];  Service: Vascular;  Laterality: Left;  . BOWEL RESECTION  2013  . CARDIAC CATHETERIZATION N/A 06/24/2015   Procedure: Left Heart Cath and Cors/Grafts Angiography;  Surgeon: Leonie Man, MD;  Location: [place] [facility];  Service: Cardiovascular;  Laterality: N/A;  . CARDIAC CATHETERIZATION N/A 06/25/2015   Procedure: Coronary Stent Intervention;  Surgeon: Sherren Mocha, MD;  Location: [place] [facility];  Service: Cardiovascular;  Laterality: N/A;  . CAROTID ENDARTERECTOMY Left   . CATARACT EXTRACTION Right 03/27/2018  . CHOLECYSTECTOMY N/A 04/19/2018   Procedure: LAPAROSCOPIC CHOLECYSTECTOMY WITH INTRAOPERATIVE CHOLANGIOGRAM;  Surgeon: Rolm Bookbinder, MD;  Location: [place];  Service: General;  Laterality: N/A;  . CORONARY ANGIOPLASTY    . CORONARY ARTERY BYPASS GRAFT  03/05/2011   CABG X3 (LIMA to LAD, SVG to OM, SVG to PDA, EVH via left thigh  . CORONARY STENT PLACEMENT    . ESOPHAGOGASTRODUODENOSCOPY N/A 04/12/2018   Procedure: ESOPHAGOGASTRODUODENOSCOPY (EGD);  Surgeon: [name], MD;  Location: [facility];  Service: Endoscopy;  Laterality: N/A;  . ESOPHAGOGASTRODUODENOSCOPY N/A 06/16/2018   Procedure: ESOPHAGOGASTRODUODENOSCOPY (EGD);  Surgeon: Thornton Park, MD;  Location: [place];  Service: Gastroenterology;  Laterality: N/A;  . LACERATION REPAIR Right 1990's   WRIST  . MAZE  03/05/2011   complete biatrial lesion set with clipping of LA appendage  . MITRAL VALVE REPAIR  03/05/2011   7mm Memo 3D ring annuloplasty for ischemic MR  . PERIPHERAL VASCULAR BALLOON ANGIOPLASTY Left 03/21/2018   Procedure: PERIPHERAL VASCULAR BALLOON ANGIOPLASTY;  Surgeon: Serafina Mitchell, MD;  Location: [place] [facility];  Service: Cardiovascular;  Laterality: Left;  . PERIPHERAL VASCULAR CATHETERIZATION N/A 06/24/2015   Procedure: Aortic Arch Angiography;  Surgeon: Serafina Mitchell, MD;  Location: [place] [facility];  Service: Cardiovascular;  Laterality: N/A;  . PERIPHERAL VASCULAR CATHETERIZATION  06/24/2015   Procedure: Peripheral Vascular Intervention;  Surgeon: Serafina Mitchell, MD;  Location: [place] [facility];  Service: Cardiovascular;;  . PERIPHERAL VASCULAR CATHETERIZATION N/A 06/24/2015    Procedure: Abdominal Aortogram;  Surgeon: Serafina Mitchell, MD;  Location: [place] [facility];  Service: Cardiovascular;  Laterality: N/A;  . RENAL ANGIOGRAM Bilateral 09/11/2013   Procedure: RENAL ANGIOGRAM;  Surgeon: Serafina Mitchell, MD;  Location: [facility];  Service: Cardiovascular;  Laterality: Bilateral;  . RIGHT FEMORAL-POPLITEAL BYPASS       reports that she has been smoking cigarettes. She has smoked for the past 50.00 years. She has never used smokeless tobacco. She reports current alcohol use. She reports current drug use. Drug: Marijuana.  Allergies  Allergen Reactions  . Isosorbide Other (See Comments)    Can only tolerate in low doses (unknown reaction) Other reaction(s): Other Unknown "FELT LIKE I WAS GOING TO EXPLODE FROM INSIDE OUT"  . Isosorbide Nitrate Other (See Comments)    Can only tolerate in low doses (unknown reaction)  . Oxycodone-Acetaminophen Itching    Tolerates Hydrocodone.  Also tolerated  oxycodone 07/13/18 admission.    . Codeine Itching  . Contrast Media [Iodinated Diagnostic Agents] Itching and Other (See Comments)    Itching of feet  . Ioxaglate Itching and Other (See Comments)    Itching of feet  . Metrizamide Itching and Other (See Comments)    Itching of feet  . Percocet [Oxycodone-Acetaminophen] Itching and Other (See Comments)    Tolerates Hydrocodone    Family History  Problem Relation Age of Onset  . Emphysema Mother   . Heart disease Mother        before age 55  . Hypertension Mother   . Hyperlipidemia Mother   . Heart attack Mother   . AAA (abdominal aortic aneurysm) Mother        rupture  . Heart attack Father 85  . Emphysema Father   . Heart disease Father        before age 48  . Hyperlipidemia Father   . Hypertension Father   . Peripheral vascular disease Father   . Diabetes Brother   . Heart disease Brother        before age 65  . Hyperlipidemia Brother   . Hypertension Brother   . Heart attack Brother        CABG  .  Heart disease Other        Vascular disease in grandparents, uncles and dad  . Colon cancer Neg Hx   . Esophageal cancer Neg Hx   . Inflammatory bowel disease Neg Hx   . Liver disease Neg Hx   . Pancreatic cancer Neg Hx   . Rectal cancer Neg Hx   . Stomach cancer Neg Hx     Prior to Admission medications   Medication Sig Start Date End Date Taking? Authorizing Provider  Albuterol Sulfate (PROAIR RESPICLICK) 366 (90 Base) MCG/ACT AEPB Inhale 1 puff into the lungs every 4 (four) hours as needed (shortness of breath/wheezing).   Yes [provider]  allopurinol (ZYLOPRIM) 100 MG tablet Take 100 mg by mouth daily. 02/02/17  Yes [provider]  Amino Acids-Protein Hydrolys (FEEDING SUPPLEMENT, PRO-STAT SUGAR FREE 64,) LIQD Take 30 mLs by mouth 3 (three) times daily with meals. 06/26/18  Yes Thurnell Lose, MD  amLODipine (NORVASC) 5 MG tablet Take 1 tablet (5 mg total) by mouth daily. 08/23/18  Yes Mikhail, Maryann, DO  atorvastatin (LIPITOR) 40 MG tablet Take 40 mg by mouth daily.  12/07/17  Yes [provider]  calcium carbonate (TUMS EX) 750 MG chewable tablet Chew 2 tablets by mouth 3 (three) times daily as needed for heartburn.    Yes [provider]  carvedilol (COREG) 25 MG tablet Take 25 mg by mouth 2 (two) times daily. 04/16/19  Yes [provider]  CREON 12000 units CPEP capsule Take 2 capsules (24,000 Units total) by mouth 3 (three) times daily before meals. 05/18/19  Yes Regalado, Belkys A, MD  diphenhydrAMINE (BENADRYL) 25 MG tablet Take 50 mg by mouth at bedtime.   Yes [provider]  feeding supplement (BOOST / RESOURCE BREEZE) LIQD Take 1 Container by mouth 2 (two) times daily.   Yes [provider]  gabapentin (NEURONTIN) 300 MG capsule Take 1 capsule (300 mg total) by mouth at bedtime. 08/22/18  Yes Mikhail, Velta Addison, DO  hydrALAZINE (APRESOLINE) 25 MG tablet Take 1 tablet (25 mg total) by mouth every 8 (eight) hours.  06/26/18  Yes Thurnell Lose, MD  HYDROmorphone (DILAUDID) 2 MG tablet Take 2 mg by  mouth every 4 (four) hours.    Yes [provider]  OXYGEN Inhale into the lungs at bedtime.   Yes [provider]  pantoprazole (PROTONIX) 40 MG tablet Take 1 tablet (40 mg total) by mouth daily. 08/03/18  Yes Patrecia Pour, MD  polyethylene glycol (MIRALAX / GLYCOLAX) packet Take 17 g by mouth daily as needed for mild constipation. 06/26/18  Yes Thurnell Lose, MD  promethazine (PHENERGAN) 12.5 MG tablet Take 12.5 mg by mouth every 4 (four) hours as needed for nausea or vomiting.   Yes [provider]  traZODone (DESYREL) 100 MG tablet Take 150 mg by mouth at bedtime.  04/19/19  Yes [provider]    Physical Exam: Constitutional: Moderately built and nourished. Vitals:   07/26/19 2100 07/26/19 2115 07/26/19 2130 07/26/19 2145  BP: (!) 159/58 (!) 171/59 (!) 162/53 (!) 160/60  Pulse: 81 79 75 77  Resp: 13 10 11  (!) 9  Temp:      TempSrc:      SpO2: 96% 96% 96% 96%  Weight:      Height:       Eyes: Anicteric no pallor. ENMT: No discharge from the ears eyes nose or mouth. Neck: No mass felt.  No neck rigidity. Respiratory: No rhonchi or crepitations. Cardiovascular: S1-S2 heard. Abdomen: Mild epigastric tenderness no guarding or rigidity. Musculoskeletal: No edema. Skin: No rash. Neurologic: Alert awake oriented to time place and person.  Moves all extremities. Psychiatric: Appears normal per normal affect.   Labs on Admission: I have personally reviewed following labs and imaging studies  CBC: Recent Labs  Lab 07/26/19 1420  WBC 11.7*  HGB 12.9  HCT 38.6  MCV 96.7  PLT 010   Basic Metabolic Panel: Recent Labs  Lab 07/26/19 1420  NA 137  K 3.6  CL 99  CO2 24  GLUCOSE 119*  BUN 6*  CREATININE 1.58*  CALCIUM 8.1*   GFR: Estimated Creatinine Clearance: 26.6 mL/min (A) (by C-G formula based on SCr of 1.58 mg/dL (H)). Liver Function  Tests: Recent Labs  Lab 07/26/19 1420  AST 18  ALT 11  ALKPHOS 80  BILITOT 0.8  PROT 5.6*  ALBUMIN 2.7*   Recent Labs  Lab 07/26/19 1420  LIPASE 152*   No results for input(s): AMMONIA in the last 168 hours. Coagulation Profile: No results for input(s): INR, PROTIME in the last 168 hours. Cardiac Enzymes: No results for input(s): CKTOTAL, CKMB, CKMBINDEX, TROPONINI in the last 168 hours. BNP (last 3 results) No results for input(s): PROBNP in the last 8760 hours. HbA1C: No results for input(s): HGBA1C in the last 72 hours. CBG: No results for input(s): GLUCAP in the last 168 hours. Lipid Profile: No results for input(s): CHOL, HDL, LDLCALC, TRIG, CHOLHDL, LDLDIRECT in the last 72 hours. Thyroid Function Tests: No results for input(s): TSH, T4TOTAL, FREET4, T3FREE, THYROIDAB in the last 72 hours. Anemia Panel: No results for input(s): VITAMINB12, FOLATE, FERRITIN, TIBC, IRON, RETICCTPCT in the last 72 hours. Urine analysis:    Component Value Date/Time   COLORURINE AMBER (A) 07/26/2019 1930   APPEARANCEUR HAZY (A) 07/26/2019 1930   LABSPEC 1.023 07/26/2019 1930   PHURINE 5.0 07/26/2019 1930   GLUCOSEU NEGATIVE 07/26/2019 1930   HGBUR NEGATIVE 07/26/2019 1930   BILIRUBINUR NEGATIVE 07/26/2019 [place] NEGATIVE 07/26/2019 1930   PROTEINUR 30 (A) 07/26/2019 1930   UROBILINOGEN 1.0 09/28/2018 1934   NITRITE NEGATIVE 07/26/2019 1930   LEUKOCYTESUR TRACE (A) 07/26/2019 1930  Sepsis Labs: @LABRCNTIP (procalcitonin:4,lacticidven:4) ) Recent Results (from the past 240 hour(s))  SARS CORONAVIRUS 2 (TAT 6-24 HRS) Nasopharyngeal Nasopharyngeal Swab     Status: None   Collection Time: 07/26/19  6:00 PM   Specimen: Nasopharyngeal Swab  Result Value Ref Range Status   SARS Coronavirus 2 NEGATIVE NEGATIVE Final    Comment: (NOTE) SARS-CoV-2 target nucleic acids are NOT DETECTED. The SARS-CoV-2 RNA is generally detectable in upper and lower respiratory specimens  during the acute phase of infection. Negative results do not preclude SARS-CoV-2 infection, do not rule out co-infections with other pathogens, and should not be used as the sole basis for treatment or other patient management decisions. Negative results must be combined with clinical observations, patient history, and epidemiological information. The expected result is Negative. Fact Sheet for Patients: SugarRoll.be Fact Sheet for Healthcare Providers: https://www.woods-mathews.com/ This test is not yet approved or cleared by the [place] FDA and  has been authorized for detection and/or diagnosis of SARS-CoV-2 by FDA under an Emergency Use Authorization (EUA). This EUA will remain  in effect (meaning this test can be used) for the duration of the COVID-19 declaration under Section 56 4(b)(1) of the Act, 21 U.S.C. section 360bbb-3(b)(1), unless the authorization is terminated or revoked sooner. Performed at [facility], [place] [address], [place], [place] [address]      Radiological Exams on Admission: Dg Chest 2 View  Result Date: 07/26/2019 CLINICAL DATA:  Possible pneumothorax EXAM: CHEST - 2 VIEW COMPARISON:  07/26/2019, 08/14/2018 FINDINGS: Post sternotomy changes and atrial appendage clip. Stents arising from the aortic arch and at the left axillary region. Mildly coarse chronic interstitial opacity. No consolidation or pleural effusion. Aortic atherosclerosis. No definitive pneumothorax is seen. IMPRESSION: 1. No definitive pneumothorax identified 2. No acute airspace disease. Electronically Signed   By: Donavan Foil M.D.   On: 07/26/2019 19:03   Dg Chest Port 1 View  Result Date: 07/26/2019 CLINICAL DATA:  RIGHT upper quadrant epigastric pain for 5 days, history CABG and MVR, hypertension, diabetes mellitus, CHF, COPD, pancreatitis EXAM: PORTABLE CHEST 1 VIEW COMPARISON:  Portable exam 1737 hours compared to 08/14/2018  FINDINGS: Normal heart size post CABG, MVR, and LEFT atrial appendage clipping. Mediastinal contours and pulmonary vascularity normal. Vascular stents identified arising from aortic arch and at the LEFT axilla. Atherosclerotic calcification aorta. Lungs appear emphysematous but clear. Question skin fold versus less likely pneumothorax at lateral RIGHT upper chest. Remaining lungs clear. No pleural effusion or segmental consolidation. Bones demineralized. IMPRESSION: COPD changes with questionable skin fold versus pneumothorax at the lateral upper RIGHT chest, favor skin fold but recommend upright PA chest radiograph to exclude pneumothorax. Findings called to Wyn Quaker PA on 07/26/2019 at 1752 hrs. Electronically Signed   By: Lavonia Dana M.D.   On: 07/26/2019 17:52    EKG: Independently reviewed.  Normal sinus rhythm with nonspecific ST-T changes and RBBB.  Assessment/Plan Principal Problem:   Acute pancreatitis Active Problems:   Essential hypertension   Atrial fibrillation (HCC)   COPD (chronic obstructive pulmonary disease) (HCC)   Coronary artery disease involving autologous vein coronary bypass graft with unstable angina pectoris (HCC)   Recurrent pancreatitis   ESRD (end stage renal disease) ([place])    1. Acute on chronic pancreatitis unknown cause we will keep patient on as needed pain relief medications.  Patient states he is scheduled to have a nerve block next week Tuesday.  Given that patient also has history of abdominal aortic aneurysm will check  a CT scan.  We will keep patient on clear liquid diet advance as tolerated. 2. CAD status post CABG denies any chest pain.  Continue statins beta-blockers. 3. Hypertension on amlodipine hydralazine and beta-blockers. 4. COPD not actively wheezing. 5. ESRD on hemodialysis on Tuesday Thursday Saturday consult nephrology. 6. History of abdominal aortic aneurysm pain seems to be more likely from pancreatitis. 7. History of A. fib status  post Maze procedure not on any anticoagulation.  24 hours COVID-19 test is pending.  Given that patient has persistent nausea vomiting with pain requiring multiple doses of IV Dilaudid patient will need inpatient status.   DVT prophylaxis: Heparin. Code Status: Full code. Family Communication: Discussed with patient. Disposition Plan: Home. Consults called: None. Admission status: Inpatient.   Rise Patience MD [facility] Pager [phone].  If 7PM-7AM, please contact night-coverage [email or web address] Password [email or web address]  07/26/2019, 11:50 PM

## 2019-07-26 NOTE — ED Provider Notes (Signed)
[facility] EMERGENCY DEPARTMENT Provider Note   CSN: [id number] Arrival date & time: 07/26/19  1401     History   Chief Complaint Chief Complaint  Patient presents with   Abdominal Pain    HPI [name] is a 68 y.o. female with a past medical history of AAA s/p endovascular stent graft in 01/2018, CHF, ESRD on HD T/Th/S, Chronic pain, COPD, Chronic pancreatitis, DM2, CAD, who presents today for evaluation of abdominal pain.  She reports that this is a flare of her chronic pancreatitis.  She states that it started 4 days ago and has been unrelenting.  She states that she missed her Tuesday dialysis that she was unable to get out of bed due to pain.  She reports that she has had 2 episodes of vomiting in the past 24 hours with minimal to no p.o. intake in the past 4 days.  She denies any fevers or cough.  She states that she is scheduled for a procedure for a nerve ablation for her chronic pancreatitis.  She denies any alcohol use.    She has been taking her home PO dilaudid with out relief.    She reports that she was at dialysis today, was able to complete 2 out of the 3.5 hours however had to stop due to pain.  She reports that her dry weight is 106 pounds, however today when they weighed her she was 101.4 pounds.  She attributes this to poor p.o. intake.  She denies any diarrhea or constipation.       HPI  Past Medical History:  Diagnosis Date   AAA (abdominal aortic aneurysm) ([place]) 5/09   3.7x3.3 by u/s 2009   Abnormal EKG    deep TW inversions chronic   Anemia    CAD (coronary artery disease)    a. CABG 03/2011 LIMA to LAD, SVG to OM, SVG to PDA. b. cath 06/25/2015 DES to SVG to rPDA, patent LIMA to LAD, patent SVG to OM   carotid stenosis 5/09   S/p L CEA ;  60-79% bilat ICA by preCABG dopplers 7/12   CHF (congestive heart failure) (HCC)    Chronic back pain    "all over" (06/24/2015)   Chronic bronchitis ([place])    "get it pretty much q  yr" (06/24/2015)   CKD (chronic kidney disease)    Complication of anesthesia 1966   "problem w/ether"   COPD (chronic obstructive pulmonary disease) ([place])    Depression    GERD (gastroesophageal reflux disease)    With hiatal hernia   GIB (gastrointestinal bleeding) 9/11   S/p EGD with cautery at [facility]   Heart murmur    History of blood transfusion "a few times"   "all related to anemia" (06/24/2015)   History of hiatal hernia    Hyperlipidemia    Hypertension    Mitral regurgitation    3+ MR by intraoperative TEE;  s/p MV repair with Dr. Roxy Manns 7/12   Myocardial infarction [facility]) 2012 "several"   On home oxygen therapy    "2 liters at night; negative for sleep apnea"   PAD (peripheral artery disease) ([place])    Severe; s/p bilateral renal artery stents, moderate in-stent restenosis   Pancreatitis 05/2018   Paroxysmal atrial fibrillation (HCC)    coumadin;  echo 9/07: EF 60%, mild LVH;  s/p Cox Maze 7/12 with LAA clipping   Subclavian artery stenosis, left ([place])    stented by Dr. Trula Slade on 10/18 to help  flow of her LIMA to LAD   Type II diabetes mellitus ([place])     Patient Active Problem List   Diagnosis Date Noted   Acute on chronic pancreatitis ([place]) 05/12/2019   Abnormal CT of the abdomen 09/25/2018   Pancreatic divisum 09/25/2018   Pancreatic mass    ESRD (end stage renal disease) ([place]) 09/09/2018   Renal failure (ARF), acute on chronic ([place]) 08/15/2018   ARF (acute renal failure) ([place]) 08/15/2018   CKD (chronic kidney disease), stage V ([place]) 08/15/2018   Hypomagnesemia 08/15/2018   Acute pulmonary edema ([place]) 08/14/2018   Goals of care, counseling/discussion    DNR (do not resuscitate) discussion    AKI (acute kidney injury) ([place]) 07/26/2018   Acute encephalopathy 07/24/2018   Chronic pain 07/24/2018   Malnutrition of moderate degree 07/13/2018   Stage II pressure ulcer ([place]) 07/12/2018   Fluid collection at surgical site     Intraabdominal fluid collection    Cystic duct calculus    RUQ pain 07/08/2018   Peripancreatic fluid collection 07/04/2018   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   Urinary retention 07/04/2018   S/P insertion of iliac artery stent 07/04/2018   Endoleak post (EVAR) endovascular aneurysm repair ([place]) 07/04/2018   Physical deconditioning    Esophageal ulcer with bleeding    Pressure injury of skin 06/16/2018   Hematemesis    Palliative care patient    Palliative care by specialist    Anasarca 06/07/2018   Demand ischemia ([place]) 06/07/2018   Protein-calorie malnutrition, severe 05/29/2018   Type II diabetes mellitus with renal manifestations ([place]) 05/28/2018   Recurrent pancreatitis 05/28/2018   Hypokalemia 05/28/2018   SIRS (systemic inflammatory response syndrome) ([place]) 05/28/2018   Acute recurrent pancreatitis 05/28/2018   Volume overload 04/17/2018   Anemia due to GI blood loss 04/14/2018   Acute gallstone pancreatitis 04/14/2018   Acute cholecystitis 04/14/2018   Gastrointestinal hemorrhage    Fatigue associated with anemia 04/09/2018   Hypoglycemia 01/19/2018   Type II diabetes mellitus ([place]) 01/19/2018   Nausea with vomiting 01/19/2018   Uncontrolled hypertension 01/19/2018   Hyperlipidemia 01/19/2018   CAD (coronary artery disease) 01/19/2018   COPD with hypoxia ([place]) 01/19/2018   Chronic kidney disease (CKD), stage IV (severe) ([place]) 01/19/2018   Retroperitoneal hematoma 01/19/2018   Acute blood loss anemia 01/19/2018   Chronic diastolic congestive heart failure ([place]) 01/19/2018   AAA (abdominal aortic aneurysm) ([place]) 01/16/2018   Elevated brain natriuretic peptide (BNP) level 10/07/2015   Diabetes mellitus ([place]) 10/07/2015   Pain in the chest    PAD (peripheral artery disease) ([place]) 07/29/2015   Chest pain 07/04/2015   Acute renal failure superimposed on stage 4 chronic kidney disease ([place]) 07/04/2015   GERD  (gastroesophageal reflux disease) 07/04/2015   Anemia 06/26/2015   Coronary artery disease involving autologous vein coronary bypass graft with unstable angina pectoris ([place]) 06/24/2015   Angina at rest [facility])    Subclavian artery stenosis, left ([place]) 06/23/2015   AAA (abdominal aortic aneurysm) without rupture ([place]) 12/10/2014   Subclavian steal syndrome 12/10/2014   Renal artery stenosis ([place]) 09/04/2013   Carotid artery stenosis 09/28/2012   Mitral valve disorder 06/23/2011   S/P CABG x 3 06/16/2011   S/P Maze operation for atrial fibrillation 06/16/2011   S/P MVR (mitral valve repair) 06/16/2011   Follow-up examination following surgery 03/29/2011   CKD (chronic kidney disease) 03/03/2011   COPD (chronic obstructive pulmonary disease) ([place]) 03/03/2011   Chronic diastolic heart failure ([place]) 02/26/2011   Hypotension  02/26/2011   NSTEMI (non-ST elevated myocardial infarction) ([place]) 02/26/2011   Long term (current) use of anticoagulants 12/17/2010   OBSTRUCTIVE SLEEP APNEA 09/04/2010   HYPOXEMIA 09/04/2010   SNORING 08/05/2010   Atrial fibrillation ([place]) 12/09/2009   WEIGHT LOSS 03/28/2009   PALPITATIONS 03/28/2009   PVD 11/21/2008   HYPERLIPIDEMIA-MIXED 11/20/2008   TOBACCO ABUSE 11/20/2008   Essential hypertension 11/20/2008   Coronary artery disease involving native heart with angina pectoris ([place]) 11/20/2008   ABDOMINAL AORTIC ANEURYSM 11/20/2008    Past Surgical History:  Procedure Laterality Date   A/V FISTULAGRAM Left 03/21/2018   Procedure: A/V FISTULAGRAM;  Surgeon: Serafina Mitchell, MD;  Location: [facility];  Service: Cardiovascular;  Laterality: Left;   ABDOMINAL AORTIC ENDOVASCULAR STENT GRAFT N/A 01/17/2018   Procedure: ABDOMINAL AORTIC ENDOVASCULAR STENT GRAFT WITH CO2;  Surgeon: Serafina Mitchell, MD;  Location: [facility];  Service: Vascular;  Laterality: N/A;   ABDOMINAL HYSTERECTOMY  1990's   APPENDECTOMY  Aug. 11, 2016    Ruptured   AV FISTULA PLACEMENT Left 11/03/2017   Procedure: ARTERIOVENOUS (AV) FISTULA CREATION LEFT ARM;  Surgeon: Serafina Mitchell, MD;  Location: [facility];  Service: Vascular;  Laterality: Left;   BOWEL RESECTION  2013   CARDIAC CATHETERIZATION N/A 06/24/2015   Procedure: Left Heart Cath and Cors/Grafts Angiography;  Surgeon: Leonie Man, MD;  Location: [facility] LAB;  Service: Cardiovascular;  Laterality: N/A;   CARDIAC CATHETERIZATION N/A 06/25/2015   Procedure: Coronary Stent Intervention;  Surgeon: Sherren Mocha, MD;  Location: [facility];  Service: Cardiovascular;  Laterality: N/A;   CAROTID ENDARTERECTOMY Left    CATARACT EXTRACTION Right 03/27/2018   CHOLECYSTECTOMY N/A 04/19/2018   Procedure: LAPAROSCOPIC CHOLECYSTECTOMY WITH INTRAOPERATIVE CHOLANGIOGRAM;  Surgeon: Rolm Bookbinder, MD;  Location: [facility];  Service: General;  Laterality: N/A;   CORONARY ANGIOPLASTY     CORONARY ARTERY BYPASS GRAFT  03/05/2011   CABG X3 (LIMA to LAD, SVG to OM, SVG to PDA, EVH via left thigh   CORONARY STENT PLACEMENT     ESOPHAGOGASTRODUODENOSCOPY N/A 04/12/2018   Procedure: ESOPHAGOGASTRODUODENOSCOPY (EGD);  Surgeon: [name], MD;  Location: [facility] ENDOSCOPY;  Service: Endoscopy;  Laterality: N/A;   ESOPHAGOGASTRODUODENOSCOPY N/A 06/16/2018   Procedure: ESOPHAGOGASTRODUODENOSCOPY (EGD);  Surgeon: Thornton Park, MD;  Location: [facility];  Service: Gastroenterology;  Laterality: N/A;   LACERATION REPAIR Right 1990's   WRIST   MAZE  03/05/2011   complete biatrial lesion set with clipping of LA appendage   MITRAL VALVE REPAIR  03/05/2011   52mm Memo 3D ring annuloplasty for ischemic MR   PERIPHERAL VASCULAR BALLOON ANGIOPLASTY Left 03/21/2018   Procedure: PERIPHERAL VASCULAR BALLOON ANGIOPLASTY;  Surgeon: Serafina Mitchell, MD;  Location: [facility];  Service: Cardiovascular;  Laterality: Left;   PERIPHERAL VASCULAR CATHETERIZATION N/A 06/24/2015    Procedure: Aortic Arch Angiography;  Surgeon: Serafina Mitchell, MD;  Location: [facility];  Service: Cardiovascular;  Laterality: N/A;   PERIPHERAL VASCULAR CATHETERIZATION  06/24/2015   Procedure: Peripheral Vascular Intervention;  Surgeon: Serafina Mitchell, MD;  Location: [facility];  Service: Cardiovascular;;   PERIPHERAL VASCULAR CATHETERIZATION N/A 06/24/2015   Procedure: Abdominal Aortogram;  Surgeon: Serafina Mitchell, MD;  Location: [facility];  Service: Cardiovascular;  Laterality: N/A;   RENAL ANGIOGRAM Bilateral 09/11/2013   Procedure: RENAL ANGIOGRAM;  Surgeon: Serafina Mitchell, MD;  Location: [facility] CATH LAB;  Service: Cardiovascular;  Laterality: Bilateral;   RIGHT  FEMORAL-POPLITEAL BYPASS       OB History   No obstetric history on file.      Home Medications    Prior to Admission medications   Medication Sig Start Date End Date Taking? Authorizing Provider  Albuterol Sulfate (PROAIR RESPICLICK) 892 (90 Base) MCG/ACT AEPB Inhale 1 puff into the lungs every 4 (four) hours as needed (shortness of breath/wheezing).    [provider]  allopurinol (ZYLOPRIM) 100 MG tablet Take 100 mg by mouth daily. 02/02/17   [provider]  Amino Acids-Protein Hydrolys (FEEDING SUPPLEMENT, PRO-STAT SUGAR FREE 64,) LIQD Take 30 mLs by mouth 3 (three) times daily with meals. 06/26/18   Thurnell Lose, MD  amLODipine (NORVASC) 5 MG tablet Take 1 tablet (5 mg total) by mouth daily. 08/23/18   Mikhail, Velta Addison, DO  atorvastatin (LIPITOR) 40 MG tablet Take 40 mg by mouth daily.  12/07/17   [provider]  calcium carbonate (TUMS EX) 750 MG chewable tablet Chew 2 tablets by mouth 3 (three) times daily as needed for heartburn.     [provider]  carvedilol (COREG) 25 MG tablet Take 25 mg by mouth 2 (two) times daily. 04/16/19   [provider]  clopidogrel (PLAVIX) 75 MG tablet Take 1 tablet (75 mg total) by mouth daily with breakfast.  06/26/15   Almyra Deforest, PA  CREON 12000 units CPEP capsule Take 2 capsules (24,000 Units total) by mouth 3 (three) times daily before meals. 05/18/19   Regalado, Belkys A, MD  diphenhydrAMINE (BENADRYL) 25 MG tablet Take 50 mg by mouth at bedtime.    [provider]  feeding supplement (BOOST / RESOURCE BREEZE) LIQD Take 1 Container by mouth 2 (two) times daily.    [provider]  gabapentin (NEURONTIN) 300 MG capsule Take 1 capsule (300 mg total) by mouth at bedtime. 08/22/18   Mikhail, Velta Addison, DO  hydrALAZINE (APRESOLINE) 25 MG tablet Take 1 tablet (25 mg total) by mouth every 8 (eight) hours. 06/26/18   Thurnell Lose, MD  HYDROmorphone (DILAUDID) 2 MG tablet Take 2 mg by mouth every 4 (four) hours.     [provider]  nicotine (NICODERM CQ - DOSED IN MG/24 HOURS) 21 mg/24hr patch Place 1 patch (21 mg total) onto the skin daily. 05/19/19   Regalado, Belkys A, MD  OXYGEN Inhale into the lungs at bedtime.    [provider]  pantoprazole (PROTONIX) 40 MG tablet Take 1 tablet (40 mg total) by mouth daily. 08/03/18   Patrecia Pour, MD  polyethylene glycol (MIRALAX / Floria Raveling) packet Take 17 g by mouth daily as needed for mild constipation. 06/26/18   Thurnell Lose, MD  PROLENSA 0.07 % SOLN Place 2 drops into the left eye at bedtime. 04/17/19   [provider]  promethazine (PHENERGAN) 12.5 MG tablet Take 12.5 mg by mouth every 4 (four) hours as needed for nausea or vomiting.    [provider]  traZODone (DESYREL) 100 MG tablet Take 100 mg by mouth at bedtime. 04/19/19   [provider]    Family History Family History  Problem Relation Age of Onset   Emphysema Mother    Heart disease Mother        before age 47   Hypertension Mother    Hyperlipidemia Mother    Heart attack Mother    AAA (abdominal aortic aneurysm) Mother        rupture   Heart attack Father 64  Emphysema Father    Heart disease Father         before age 22   Hyperlipidemia Father    Hypertension Father    Peripheral vascular disease Father    Diabetes Brother    Heart disease Brother        before age 38   Hyperlipidemia Brother    Hypertension Brother    Heart attack Brother        CABG   Heart disease Other        Vascular disease in grandparents, uncles and dad   Colon cancer Neg Hx    Esophageal cancer Neg Hx    Inflammatory bowel disease Neg Hx    Liver disease Neg Hx    Pancreatic cancer Neg Hx    Rectal cancer Neg Hx    Stomach cancer Neg Hx     Social History Social History   Tobacco Use   Smoking status: Current Every Day Smoker    Years: 50.00    Types: Cigarettes   Smokeless tobacco: Never Used   Tobacco comment: 3/4 pk per day  Substance Use Topics   Alcohol use: Yes    Alcohol/week: 0.0 standard drinks    Comment: occasionally   Drug use: Yes    Types: Marijuana     Allergies   Isosorbide, Isosorbide nitrate, Oxycodone-acetaminophen, Codeine, Contrast media [iodinated diagnostic agents], Ioxaglate, Metrizamide, and Percocet [oxycodone-acetaminophen]   Review of Systems Review of Systems  Constitutional: Negative for chills and fever.  HENT: Negative for congestion.   Respiratory: Negative for cough, chest tightness and shortness of breath.   Gastrointestinal: Positive for abdominal pain, nausea and vomiting. Negative for constipation and diarrhea.  Skin: Negative for color change, rash and wound.  Neurological: Negative for weakness and headaches.  All other systems reviewed and are negative.    Physical Exam Updated Vital Signs BP (!) 166/72 (BP Location: Right Arm)    Pulse 87    Temp 98.1 F (36.7 C) (Oral)    Resp 18    Ht 5\' 5"  (1.651 m)    Wt 48.1 kg    SpO2 98%    BMI 17.64 kg/m   Physical Exam Vitals signs and nursing note reviewed.  Constitutional:      Appearance: She is well-developed.     Comments: Appears uncomfortable consistent with pain.    HENT:     Head: Normocephalic and atraumatic.  Eyes:     Conjunctiva/sclera: Conjunctivae normal.  Neck:     Musculoskeletal: Neck supple.  Cardiovascular:     Rate and Rhythm: Normal rate and regular rhythm.     Heart sounds: No murmur.  Pulmonary:     Effort: Pulmonary effort is normal. No respiratory distress.     Breath sounds: Normal breath sounds.  Abdominal:     General: Abdomen is flat. Bowel sounds are increased.     Palpations: Abdomen is soft.     Tenderness: There is abdominal tenderness (Generalized tenderness to palpation, worse in epigastric.).  Skin:    General: Skin is warm and dry.  Neurological:     General: No focal deficit present.     Mental Status: She is alert.  Psychiatric:        Mood and Affect: Mood normal.      ED Treatments / Results  Labs (all labs ordered are listed, but only abnormal results are displayed) Labs Reviewed  LIPASE, BLOOD - Abnormal; Notable for the following components:  Result Value   Lipase 152 (*)    All other components within normal limits  COMPREHENSIVE METABOLIC PANEL - Abnormal; Notable for the following components:   Glucose, Bld 119 (*)    BUN 6 (*)    Creatinine, Ser 1.58 (*)    Calcium 8.1 (*)    Total Protein 5.6 (*)    Albumin 2.7 (*)    GFR calc non Af Amer 34 (*)    GFR calc Af Amer 39 (*)    All other components within normal limits  CBC - Abnormal; Notable for the following components:   WBC 11.7 (*)    RDW 15.8 (*)    All other components within normal limits  TROPONIN I (HIGH SENSITIVITY) - Abnormal; Notable for the following components:   Troponin I (High Sensitivity) 22 (*)    All other components within normal limits  SARS CORONAVIRUS 2 (TAT 6-24 HRS)  URINALYSIS, ROUTINE W REFLEX MICROSCOPIC    EKG EKG Interpretation  Date/Time:  Thursday July 26 2019 14:06:36 EST Ventricular Rate:  79 PR Interval:  110 QRS Duration: 126 QT Interval:  460 QTC Calculation: 527 R  Axis:   96 Text Interpretation: Sinus rhythm with short PR with Premature supraventricular complexes and with occasional Premature ventricular complexes Right bundle branch block T wave abnormality, consider inferolateral ischemia Abnormal ECG No significant change since last tracing \ Confirmed by Deno Etienne [id number]) on 07/26/2019 2:15:22 PM   EKG Interpretation  Date/Time:  Thursday July 26 2019 17:22:07 EST Ventricular Rate:  87 PR Interval:  110 QRS Duration: 133 QT Interval:  413 QTC Calculation: 462 R Axis:   -59 Text Interpretation: Sinus rhythm RBBB and LAFB Nonspecific T abnormalities, lateral leads Confirmed by Blanchie Dessert ([id number]) on 07/26/2019 5:39:49 PM        Radiology Dg Chest 2 View  Result Date: 07/26/2019 CLINICAL DATA:  Possible pneumothorax EXAM: CHEST - 2 VIEW COMPARISON:  07/26/2019, 08/14/2018 FINDINGS: Post sternotomy changes and atrial appendage clip. Stents arising from the aortic arch and at the left axillary region. Mildly coarse chronic interstitial opacity. No consolidation or pleural effusion. Aortic atherosclerosis. No definitive pneumothorax is seen. IMPRESSION: 1. No definitive pneumothorax identified 2. No acute airspace disease. Electronically Signed   By: Donavan Foil M.D.   On: 07/26/2019 19:03   Dg Chest Port 1 View  Result Date: 07/26/2019 CLINICAL DATA:  RIGHT upper quadrant epigastric pain for 5 days, history CABG and MVR, hypertension, diabetes mellitus, CHF, COPD, pancreatitis EXAM: PORTABLE CHEST 1 VIEW COMPARISON:  Portable exam 1737 hours compared to 08/14/2018 FINDINGS: Normal heart size post CABG, MVR, and LEFT atrial appendage clipping. Mediastinal contours and pulmonary vascularity normal. Vascular stents identified arising from aortic arch and at the LEFT axilla. Atherosclerotic calcification aorta. Lungs appear emphysematous but clear. Question skin fold versus less likely pneumothorax at lateral RIGHT upper chest. Remaining  lungs clear. No pleural effusion or segmental consolidation. Bones demineralized. IMPRESSION: COPD changes with questionable skin fold versus pneumothorax at the lateral upper RIGHT chest, favor skin fold but recommend upright PA chest radiograph to exclude pneumothorax. Findings called to Wyn Quaker PA on 07/26/2019 at 1752 hrs. Electronically Signed   By: Lavonia Dana M.D.   On: 07/26/2019 17:52    Procedures Procedures (including critical care time)  Medications Ordered in ED Medications  0.9 %  sodium chloride infusion (has no administration in time range)  HYDROmorphone (DILAUDID) injection 1.5 mg (has no administration in time range)  promethazine (PHENERGAN)  injection 12.5 mg (has no administration in time range)  sodium chloride flush (NS) 0.9 % injection 3 mL (3 mLs Intravenous Given 07/26/19 1653)  HYDROmorphone (DILAUDID) injection 1 mg (1 mg Intravenous Given 07/26/19 1717)  sodium chloride 0.9 % bolus 500 mL (0 mLs Intravenous Stopped 07/26/19 1811)  HYDROmorphone (DILAUDID) injection 1.5 mg (1.5 mg Intravenous Given 07/26/19 1819)     Initial Impression / Assessment and Plan / ED Course  I have reviewed the triage vital signs and the nursing notes.  Pertinent labs & imaging results that were available during my care of the patient were reviewed by me and considered in my medical decision making (see chart for details).  Clinical Course as of Jul 25 1941  Thu Jul 26, 2019  9326 Received call from radiologist.  They are requesting a upright chest x-ray as there is either a skinfold or a right-sided pneumothorax.   [EH]  [facility] to ask to get x-ray sooner.    [EH]  1924 Was 27 two months ago, suspect baseline leak.   Troponin I (High Sensitivity)(!): 22 [EH]  1932 Patient reevaluated, her pain remains a 7 out of 10.  Will repeat Dilaudid and admit.   [EH]  6 Spoke with Dr. Lara Mulch who will see patient for admission.     [EH]    Clinical Course  User Index [EH] Lorin Glass, PA-C      Patient presents today for evaluation of the gastric abdominal pain.  On exam she appears uncomfortable.  She has a history of chronic pancreatitis and reports that this feels very similar.  She reports nausea, vomiting, and decreased p.o. intake for the past 4 days.  Troponin is 22, 2 months ago was 27 consistent with a baseline leak.  Lipase is 152.  White count is slightly elevated at 11.7.  CMP appears consistent with her baseline.  EKG without evidence of ischemia.  Initial portable chest x-ray showing suspected skinfold however unable to rule out pneumothorax.  Repeat upright chest x-ray without pneumothorax, consolidation, or other abnormalities.    Patient received multiple rounds of pain medicine while in the emergency room without adequate pain relief.  Initial EKG showed a prolonged QT, however repeat showed significant improvement.  She takes Phenergan at home and is given a dose of her home Phenergan.  Based on the reported poor p.o. intake, along with her reported dry weight being 48kg and at dialysis today weighing 46 kg she appears to be dehydrated.  She is given a 500 cc fluid bolus along with subsequent infusion.    As patient's pain is consistent with her multiple bouts of acute on chronic pancreatitis with CT last obtained approximately 2 months ago repeat imaging was not obtained at this time.  I spoke with Dr. Lara Mulch who will see the patient for admission.     Final Clinical Impressions(s) / ED Diagnoses   Final diagnoses:  Acute on chronic pancreatitis [facility])    ED Discharge Orders    None       Lorin Glass, PA-C 07/26/19 2317    Blanchie Dessert, MD 07/26/19 2358

## 2019-07-27 ENCOUNTER — Inpatient Hospital Stay (HOSPITAL_COMMUNITY): Payer: Medicare Other

## 2019-07-27 LAB — BASIC METABOLIC PANEL
Anion gap: 13 (ref 5–15)
BUN: 11 mg/dL (ref 8–23)
CO2: 24 mmol/L (ref 22–32)
Calcium: 8.2 mg/dL — ABNORMAL LOW (ref 8.9–10.3)
Chloride: 99 mmol/L (ref 98–111)
Creatinine, Ser: 1.99 mg/dL — ABNORMAL HIGH (ref 0.44–1.00)
GFR calc Af Amer: 30 mL/min — ABNORMAL LOW (ref >60)
GFR calc non Af Amer: 26 mL/min — ABNORMAL LOW (ref >60)
Glucose, Bld: 102 mg/dL — ABNORMAL HIGH (ref 70–99)
Potassium: 4.4 mmol/L (ref 3.5–5.1)
Sodium: 136 mmol/L (ref 135–145)

## 2019-07-27 LAB — GLUCOSE, CAPILLARY
Glucose-Capillary: 83 mg/dL (ref 70–99)
Glucose-Capillary: 135 mg/dL — ABNORMAL HIGH (ref 70–99)
Glucose-Capillary: 96 mg/dL (ref 70–99)

## 2019-07-27 LAB — CBC WITH DIFFERENTIAL/PLATELET
Abs Immature Granulocytes: 0.08 10*3/uL — ABNORMAL HIGH (ref 0.00–0.07)
Basophils Absolute: 0.1 10*3/uL (ref 0.0–0.1)
Basophils Relative: 1 %
Eosinophils Absolute: 0.2 10*3/uL (ref 0.0–0.5)
Eosinophils Relative: 2 %
HCT: 37.4 % (ref 36.0–46.0)
Hemoglobin: 12.2 g/dL (ref 12.0–15.0)
Immature Granulocytes: 1 %
Lymphocytes Relative: 8 %
Lymphs Abs: 0.8 10*3/uL (ref 0.7–4.0)
MCH: 32.2 pg (ref 26.0–34.0)
MCHC: 32.6 g/dL (ref 30.0–36.0)
MCV: 98.7 fL (ref 80.0–100.0)
Monocytes Absolute: 0.7 10*3/uL (ref 0.1–1.0)
Monocytes Relative: 7 %
Neutro Abs: 7.9 10*3/uL — ABNORMAL HIGH (ref 1.7–7.7)
Neutrophils Relative %: 81 %
Platelets: 248 10*3/uL (ref 150–400)
RBC: 3.79 MIL/uL — ABNORMAL LOW (ref 3.87–5.11)
RDW: 16 % — ABNORMAL HIGH (ref 11.5–15.5)
WBC: 9.7 10*3/uL (ref 4.0–10.5)
nRBC: 0 % (ref 0.0–0.2)

## 2019-07-27 LAB — MAGNESIUM: Magnesium: 1.7 mg/dL (ref 1.7–2.4)

## 2019-07-27 LAB — PHOSPHORUS: Phosphorus: 4.4 mg/dL (ref 2.5–4.6)

## 2019-07-27 LAB — LIPASE, BLOOD: Lipase: 122 U/L — ABNORMAL HIGH (ref 11–51)

## 2019-07-27 MED ORDER — LABETALOL HCL 5 MG/ML IV SOLN: 10.0000 mg | INTRAVENOUS | Status: DC | PRN

## 2019-07-27 MED ORDER — CHLORHEXIDINE GLUCONATE CLOTH 2 % EX PADS
6.0000 | MEDICATED_PAD | Freq: Every day | CUTANEOUS | Status: DC
  Administered 2019-07-27: 6 via TOPICAL

## 2019-07-27 MED ORDER — HYDROMORPHONE HCL 1 MG/ML IJ SOLN
1.0000 mg | INTRAMUSCULAR | Status: DC | PRN
  Administered 2019-07-27 – 2019-07-30 (×27): 1 mg via INTRAVENOUS
  Filled 2019-07-27 (×24): qty 1

## 2019-07-27 NOTE — Consult Note (Addendum)
[facility] Renal Consultation Note    Indication for Consultation:  Management of ESRD/hemodialysis, anemia, hypertension/volume, and secondary hyperparathyroidism. PCP:  HPI: [name] is a 68 y.o. female with ESRD, CAD (Hx CABG), COPD, HTN, HL, T2DM, PAD, paroxysmal Afib, GERD, and depression who was admitted with pancreatitis.  Was send via EMS from her HD clinic to the ED on 11/19 after having complaints of severe epigastric pain radiating to back with N/V which started on 11/15. She has Hx recurrent pancreatitis and had hoped that if she just took her pain meds and limited her intake it would resolve. Unfortunately, that did not happen. Pain got worse, which prompted ED eval. Of note, she is scheduled for "nerve block" for this issue on 11/24.  Says has happened too many times  In the ED - vitals were ok. Labs showed K 3.6, Ca 8.1, Cr 1.58 (post-HD), WBC 11.7, Hgb 12.9, Lipase 152. CT abdomen early this morning showed "groove pancreatitis with increased inflammation from 05/2019 CT", also noted to have pulmonary edema and trace B effusions.  Today, she is still having abd pain, improved from admit. No CP. Has noticed some mild dyspnea, but nto bad enough to require O2 supplementation. Afebrile.  Our team was consulted for dialysis needs. She dialyzes on TTS schedule at [facility] clinic. She did have about half of her HD session on 11/19 prior to presentation. On that day, she was noted to be about 1.5kg below usual dry weight. She uses a AVF which has been stable.  Past Medical History:  Diagnosis Date  . AAA (abdominal aortic aneurysm) ([place]) 5/09   3.7x3.3 by u/s 2009  . Abnormal EKG    deep TW inversions chronic  . Anemia   . CAD (coronary artery disease)    a. CABG 03/2011 LIMA to LAD, SVG to OM, SVG to PDA. b. cath 06/25/2015 DES to SVG to rPDA, patent LIMA to LAD, patent SVG to OM  . carotid stenosis 5/09   S/p L CEA ;  60-79% bilat ICA by preCABG dopplers  7/12  . CHF (congestive heart failure) ([place])   . Chronic back pain    "all over" (06/24/2015)  . Chronic bronchitis ([place])    "get it pretty much q yr" (06/24/2015)  . CKD (chronic kidney disease)   . Complication of anesthesia 1966   "problem w/ether"  . COPD (chronic obstructive pulmonary disease) ([place])   . Depression   . GERD (gastroesophageal reflux disease)    With hiatal hernia  . GIB (gastrointestinal bleeding) 9/11   S/p EGD with cautery at [facility]  . Heart murmur   . History of blood transfusion "a few times"   "all related to anemia" (06/24/2015)  . History of hiatal hernia   . Hyperlipidemia   . Hypertension   . Mitral regurgitation    3+ MR by intraoperative TEE;  s/p MV repair with Dr. Roxy Manns 7/12  . Myocardial infarction [facility]) 2012 "several"  . On home oxygen therapy    "2 liters at night; negative for sleep apnea"  . PAD (peripheral artery disease) (H[facility])    Severe; s/p bilateral renal artery stents, moderate in-stent restenosis  . Pancreatitis 05/2018  . Paroxysmal atrial fibrillation (H[facility])    coumadin;  echo 9/07: EF 60%, mild LVH;  s/p Cox Maze 7/12 with LAA clipping  . Subclavian artery stenosis, left (H[facility])    stented by Dr. Trula Slade on 10/18 to help flow of her LIMA to LAD  .  Type II diabetes mellitus ([place])    Past Surgical History:  Procedure Laterality Date  . A/V FISTULAGRAM Left 03/21/2018   Procedure: A/V FISTULAGRAM;  Surgeon: Serafina Mitchell, MD;  Location: [place] [facility];  Service: Cardiovascular;  Laterality: Left;  . ABDOMINAL AORTIC ENDOVASCULAR STENT GRAFT N/A 01/17/2018   Procedure: ABDOMINAL AORTIC ENDOVASCULAR STENT GRAFT WITH CO2;  Surgeon: Serafina Mitchell, MD;  Location: [place];  Service: Vascular;  Laterality: N/A;  . ABDOMINAL HYSTERECTOMY  1990's  . APPENDECTOMY  Aug. 11, 2016   Ruptured  . AV FISTULA PLACEMENT Left 11/03/2017   Procedure: ARTERIOVENOUS (AV) FISTULA CREATION LEFT ARM;  Surgeon: Serafina Mitchell, MD;  Location: [place];   Service: Vascular;  Laterality: Left;  . BOWEL RESECTION  2013  . CARDIAC CATHETERIZATION N/A 06/24/2015   Procedure: Left Heart Cath and Cors/Grafts Angiography;  Surgeon: Leonie Man, MD;  Location: [place] [facility];  Service: Cardiovascular;  Laterality: N/A;  . CARDIAC CATHETERIZATION N/A 06/25/2015   Procedure: Coronary Stent Intervention;  Surgeon: Sherren Mocha, MD;  Location: [place] [facility];  Service: Cardiovascular;  Laterality: N/A;  . CAROTID ENDARTERECTOMY Left   . CATARACT EXTRACTION Right 03/27/2018  . CHOLECYSTECTOMY N/A 04/19/2018   Procedure: LAPAROSCOPIC CHOLECYSTECTOMY WITH INTRAOPERATIVE CHOLANGIOGRAM;  Surgeon: Rolm Bookbinder, MD;  Location: [place];  Service: General;  Laterality: N/A;  . CORONARY ANGIOPLASTY    . CORONARY ARTERY BYPASS GRAFT  03/05/2011   CABG X3 (LIMA to LAD, SVG to OM, SVG to PDA, EVH via left thigh  . CORONARY STENT PLACEMENT    . ESOPHAGOGASTRODUODENOSCOPY N/A 04/12/2018   Procedure: ESOPHAGOGASTRODUODENOSCOPY (EGD);  Surgeon: [name], MD;  Location: [facility] ENDOSCOPY;  Service: Endoscopy;  Laterality: N/A;  . ESOPHAGOGASTRODUODENOSCOPY N/A 06/16/2018   Procedure: ESOPHAGOGASTRODUODENOSCOPY (EGD);  Surgeon: Thornton Park, MD;  Location: [place];  Service: Gastroenterology;  Laterality: N/A;  . LACERATION REPAIR Right 1990's   WRIST  . MAZE  03/05/2011   complete biatrial lesion set with clipping of LA appendage  . MITRAL VALVE REPAIR  03/05/2011   38mm Memo 3D ring annuloplasty for ischemic MR  . PERIPHERAL VASCULAR BALLOON ANGIOPLASTY Left 03/21/2018   Procedure: PERIPHERAL VASCULAR BALLOON ANGIOPLASTY;  Surgeon: Serafina Mitchell, MD;  Location: [place] [facility];  Service: Cardiovascular;  Laterality: Left;  . PERIPHERAL VASCULAR CATHETERIZATION N/A 06/24/2015   Procedure: Aortic Arch Angiography;  Surgeon: Serafina Mitchell, MD;  Location: [place] [facility];  Service: Cardiovascular;  Laterality: N/A;  . PERIPHERAL VASCULAR  CATHETERIZATION  06/24/2015   Procedure: Peripheral Vascular Intervention;  Surgeon: Serafina Mitchell, MD;  Location: [place] [facility];  Service: Cardiovascular;;  . PERIPHERAL VASCULAR CATHETERIZATION N/A 06/24/2015   Procedure: Abdominal Aortogram;  Surgeon: Serafina Mitchell, MD;  Location: [place] [facility];  Service: Cardiovascular;  Laterality: N/A;  . RENAL ANGIOGRAM Bilateral 09/11/2013   Procedure: RENAL ANGIOGRAM;  Surgeon: Serafina Mitchell, MD;  Location: [facility] CATH LAB;  Service: Cardiovascular;  Laterality: Bilateral;  . RIGHT FEMORAL-POPLITEAL BYPASS     Family History  Problem Relation Age of Onset  . Emphysema Mother   . Heart disease Mother        before age 65  . Hypertension Mother   . Hyperlipidemia Mother   . Heart attack Mother   . AAA (abdominal aortic aneurysm) Mother        rupture  . Heart attack Father 43  . Emphysema Father   . Heart disease  Father        before age 59  . Hyperlipidemia Father   . Hypertension Father   . Peripheral vascular disease Father   . Diabetes Brother   . Heart disease Brother        before age 61  . Hyperlipidemia Brother   . Hypertension Brother   . Heart attack Brother        CABG  . Heart disease Other        Vascular disease in grandparents, uncles and dad  . Colon cancer Neg Hx   . Esophageal cancer Neg Hx   . Inflammatory bowel disease Neg Hx   . Liver disease Neg Hx   . Pancreatic cancer Neg Hx   . Rectal cancer Neg Hx   . Stomach cancer Neg Hx    Social History:  reports that she has been smoking cigarettes. She has smoked for the past 50.00 years. She has never used smokeless tobacco. She reports current alcohol use. She reports current drug use. Drug: Marijuana.  ROS: As per HPI otherwise negative.  Physical Exam: Vitals:   07/27/19 0347 07/27/19 0410 07/27/19 0550 07/27/19 0601  BP: (!) 156/53  (!) 186/64   Pulse: 76  78   Resp:   16   Temp: 99.5 F (37.5 C)  98.8 F (37.1 C)   TempSrc: Oral  Oral    SpO2: (!) 89% 94% 93%   Weight:    47.5 kg  Height:         General: Well developed, well nourished, in no acute distress. Head: Normocephalic, atraumatic, sclera non-icteric, mucus membranes are moist. Neck: Supple without lymphadenopathy/masses. JVD not elevated. Lungs: Clear bilaterally to auscultation without wheezes, rales, or rhonchi. Breathing is unlabored. Heart: RRR with normal S1, S2. No murmurs, rubs, or gallops appreciated. Abdomen: Soft, mild tenderness across upper abdomen without guarding. Musculoskeletal:  Strength and tone appear normal for age. Lower extremities: No edema or ischemic changes, no open wounds. Neuro: Alert and oriented X 3. Moves all extremities spontaneously. Psych:  Responds to questions appropriately with a normal affect. Dialysis Access: L AVF + thrill  Allergies  Allergen Reactions  . Isosorbide Other (See Comments)    Can only tolerate in low doses (unknown reaction) Other reaction(s): Other Unknown "FELT LIKE I WAS GOING TO EXPLODE FROM INSIDE OUT"  . Isosorbide Nitrate Other (See Comments)    Can only tolerate in low doses (unknown reaction)  . Oxycodone-Acetaminophen Itching    Tolerates Hydrocodone.  Also tolerated oxycodone 07/13/18 admission.    . Codeine Itching  . Contrast Media [Iodinated Diagnostic Agents] Itching and Other (See Comments)    Itching of feet  . Ioxaglate Itching and Other (See Comments)    Itching of feet  . Metrizamide Itching and Other (See Comments)    Itching of feet  . Percocet [Oxycodone-Acetaminophen] Itching and Other (See Comments)    Tolerates Hydrocodone   Prior to Admission medications   Medication Sig Start Date End Date Taking? Authorizing Provider  Albuterol Sulfate (PROAIR RESPICLICK) 314 (90 Base) MCG/ACT AEPB Inhale 1 puff into the lungs every 4 (four) hours as needed (shortness of breath/wheezing).   Yes [provider]  allopurinol (ZYLOPRIM) 100 MG tablet Take 100 mg by mouth daily.  02/02/17  Yes [provider]  Amino Acids-Protein Hydrolys (FEEDING SUPPLEMENT, PRO-STAT SUGAR FREE 64,) LIQD Take 30 mLs by mouth 3 (three) times daily with meals. 06/26/18  Yes Thurnell Lose, MD  amLODipine ([place])  5 MG tablet Take 1 tablet (5 mg total) by mouth daily. 08/23/18  Yes Mikhail, Maryann, DO  atorvastatin (LIPITOR) 40 MG tablet Take 40 mg by mouth daily.  12/07/17  Yes [provider]  calcium carbonate (TUMS EX) 750 MG chewable tablet Chew 2 tablets by mouth 3 (three) times daily as needed for heartburn.    Yes [provider]  carvedilol (COREG) 25 MG tablet Take 25 mg by mouth 2 (two) times daily. 04/16/19  Yes [provider]  CREON 12000 units CPEP capsule Take 2 capsules (24,000 Units total) by mouth 3 (three) times daily before meals. 05/18/19  Yes Regalado, Belkys A, MD  diphenhydrAMINE (BENADRYL) 25 MG tablet Take 50 mg by mouth at bedtime.   Yes [provider]  feeding supplement (BOOST / RESOURCE BREEZE) LIQD Take 1 Container by mouth 2 (two) times daily.   Yes [provider]  gabapentin (NEURONTIN) 300 MG capsule Take 1 capsule (300 mg total) by mouth at bedtime. 08/22/18  Yes Mikhail, Velta Addison, DO  hydrALAZINE (APRESOLINE) 25 MG tablet Take 1 tablet (25 mg total) by mouth every 8 (eight) hours. 06/26/18  Yes Thurnell Lose, MD  HYDROmorphone (DILAUDID) 2 MG tablet Take 2 mg by mouth every 4 (four) hours.    Yes [provider]  OXYGEN Inhale into the lungs at bedtime.   Yes [provider]  pantoprazole (PROTONIX) 40 MG tablet Take 1 tablet (40 mg total) by mouth daily. 08/03/18  Yes Patrecia Pour, MD  polyethylene glycol (MIRALAX / GLYCOLAX) packet Take 17 g by mouth daily as needed for mild constipation. 06/26/18  Yes Thurnell Lose, MD  promethazine (PHENERGAN) 12.5 MG tablet Take 12.5 mg by mouth every 4 (four) hours as needed for nausea or vomiting.   Yes [provider]   traZODone (DESYREL) 100 MG tablet Take 150 mg by mouth at bedtime.  04/19/19  Yes [provider]   Current Facility-Administered Medications  Medication Dose Route Frequency Provider Last Rate Last Dose  . acetaminophen (TYLENOL) tablet 650 mg  650 mg Oral Q6H PRN Rise Patience, MD   650 mg at 07/27/19 0602   Or  . acetaminophen (TYLENOL) suppository 650 mg  650 mg Rectal Q6H PRN Rise Patience, MD      . albuterol (PROVENTIL) (2.5 MG/3ML) 0.083% nebulizer solution 2.5 mg  2.5 mg Inhalation Q4H PRN Rise Patience, MD      . allopurinol (ZYLOPRIM) tablet 100 mg  100 mg Oral Daily Rise Patience, MD      . amLODipine (NORVASC) tablet 5 mg  5 mg Oral Daily Rise Patience, MD      . atorvastatin (LIPITOR) tablet 40 mg  40 mg Oral Daily Rise Patience, MD      . calcium carbonate (TUMS - dosed in mg elemental calcium) chewable tablet 800 mg of elemental calcium  4 tablet Oral TID PRN Rise Patience, MD      . carvedilol (COREG) tablet 25 mg  25 mg Oral BID WC Rise Patience, MD   25 mg at 07/27/19 0806  . gabapentin (NEURONTIN) capsule 300 mg  300 mg Oral QHS Rise Patience, MD   300 mg at 07/27/19 0153  . heparin injection 5,000 Units  5,000 Units Subcutaneous Q8H Rise Patience, MD      . hydrALAZINE (APRESOLINE) tablet 25 mg  25 mg Oral Q8H Rise Patience, MD   25 mg  at 07/27/19 0602  . HYDROmorphone (DILAUDID) injection 0.5 mg  0.5 mg Intravenous Q2H PRN Rise Patience, MD   0.5 mg at 07/27/19 8250  . labetalol (NORMODYNE) injection 10 mg  10 mg Intravenous Q2H PRN Rise Patience, MD      . lipase/protease/amylase (CREON) capsule 24,000 Units  24,000 Units Oral TID [facility] Rise Patience, MD   24,000 Units at 07/27/19 [phone]  . ondansetron (ZOFRAN) tablet 4 mg  4 mg Oral Q6H PRN Rise Patience, MD       Or  . ondansetron [facility]) injection 4 mg  4 mg Intravenous Q6H PRN Rise Patience, MD   4 mg  at 07/27/19 0806  . pantoprazole (PROTONIX) EC tablet 40 mg  40 mg Oral Daily Rise Patience, MD      . traZODone (DESYREL) tablet 150 mg  150 mg Oral QHS Rise Patience, MD   150 mg at 07/27/19 0153   Labs: Basic Metabolic Panel: Recent Labs  Lab 07/26/19 1420 07/27/19 0802  NA 137 136  K 3.6 4.4  CL 99 99  CO2 24 24  GLUCOSE 119* 102*  BUN 6* 11  CREATININE 1.58* 1.99*  CALCIUM 8.1* 8.2*  PHOS  --  4.4   Liver Function Tests: Recent Labs  Lab 07/26/19 1420  AST 18  ALT 11  ALKPHOS 80  BILITOT 0.8  PROT 5.6*  ALBUMIN 2.7*   Recent Labs  Lab 07/26/19 1420 07/27/19 0802  LIPASE 152* 122*   CBC: Recent Labs  Lab 07/26/19 1420 07/27/19 0802  WBC 11.7* 9.7  NEUTROABS  --  7.9*  HGB 12.9 12.2  HCT 38.6 37.4  MCV 96.7 98.7  PLT 273 248   Studies/Results: Ct Abdomen Pelvis Wo Contrast  Result Date: 07/27/2019 CLINICAL DATA:  Nausea and vomiting.  Mid abdominal pain. EXAM: CT ABDOMEN AND PELVIS WITHOUT CONTRAST TECHNIQUE: Multidetector CT imaging of the abdomen and pelvis was performed following the standard protocol without IV contrast. COMPARISON:  05/12/2019 FINDINGS: Lower chest: Normal heart size. Coronary calcification that is extensive. Mild atelectasis in the lower lungs. Septal thickening and trace pleural effusions in the lower lungs. Hepatobiliary: No focal liver abnormality.Cholecystectomy. No bile duct dilatation or visible calcified cholelithiasis. Pancreas: Fullness of the pancreatic head with indistinct margins and coarse calcifications. There is contiguous inflammation of the mid duodenum with submucosal low-density thickening of folds. No gastric outlet obstruction or evident perforation. Spleen: Unremarkable. Adrenals/Urinary Tract: Negative adrenals. Multiple hilar calcifications from some combination of urinary calculi and atherosclerosis. No hydronephrosis. Unremarkable bladder. Stomach/Bowel: No obstruction. No evidence of bowel  inflammation outside of the duodenal findings noted above. Postoperative sigmoid colon. Appendectomy. Vascular/Lymphatic: No acute vascular abnormality. Aortic iliac stent grafting for aneurysm. There is been bilateral renal artery stenting. 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 Heavily calcified and collapsed appearing left SFA and right SFA. No mass or adenopathy. Reproductive:Hysterectomy Other: No ascites or pneumoperitoneum. Musculoskeletal: No acute abnormalities. IMPRESSION: 1. Groove pancreatitis. Inflammation is greater than seen on CT in September 2020. There is a background of pancreatic calcification from remote/chronic pancreatitis. 2. Interstitial pulmonary edema and trace pleural effusions. Severe atherosclerosis with aortic aneurysm stent grafting. Electronically Signed   By: Monte Fantasia M.D.   On: 07/27/2019 07:19   Dg Chest 2 View  Result Date: 07/26/2019 CLINICAL DATA:  Possible pneumothorax EXAM: CHEST - 2 VIEW COMPARISON:  07/26/2019, 08/14/2018 FINDINGS: Post sternotomy changes and atrial appendage clip. Stents  arising from the aortic arch and at the left axillary region. Mildly coarse chronic interstitial opacity. No consolidation or pleural effusion. Aortic atherosclerosis. No definitive pneumothorax is seen. IMPRESSION: 1. No definitive pneumothorax identified 2. No acute airspace disease. Electronically Signed   By: Donavan Foil M.D.   On: 07/26/2019 19:03   Dg Abd 1 View  Result Date: 07/26/2019 CLINICAL DATA:  Nausea and vomiting. History of pancreatitis. EXAM: ABDOMEN - 1 VIEW COMPARISON:  Most recent CT 05/12/2019 FINDINGS: No evidence of free air. No bowel dilatation to suggest obstruction. Aorto bi-iliac stent graft in place. Cholecystectomy clips in the right upper quadrant. Right upper quadrant calcifications correspond to pancreatic calcifications as well as vascular calcifications on CT. Chain suture noted in the pelvis.  Surgical clips in the right inguinal region. Aortic and branch atherosclerosis. Bones are under mineralized. No acute osseous abnormality. IMPRESSION: 1. Normal bowel gas pattern. No evidence of free air. 2. Right upper quadrant calcifications correspond to pancreatic calcifications on CT. Electronically Signed   By: Keith Rake M.D.   On: 07/26/2019 23:48   Dg Chest Port 1 View  Result Date: 07/26/2019 CLINICAL DATA:  RIGHT upper quadrant epigastric pain for 5 days, history CABG and MVR, hypertension, diabetes mellitus, CHF, COPD, pancreatitis EXAM: PORTABLE CHEST 1 VIEW COMPARISON:  Portable exam 1737 hours compared to 08/14/2018 FINDINGS: Normal heart size post CABG, MVR, and LEFT atrial appendage clipping. Mediastinal contours and pulmonary vascularity normal. Vascular stents identified arising from aortic arch and at the LEFT axilla. Atherosclerotic calcification aorta. Lungs appear emphysematous but clear. Question skin fold versus less likely pneumothorax at lateral RIGHT upper chest. Remaining lungs clear. No pleural effusion or segmental consolidation. Bones demineralized. IMPRESSION: COPD changes with questionable skin fold versus pneumothorax at the lateral upper RIGHT chest, favor skin fold but recommend upright PA chest radiograph to exclude pneumothorax. Findings called to Wyn Quaker PA on 07/26/2019 at 1752 hrs. Electronically Signed   By: Lavonia Dana M.D.   On: 07/26/2019 17:52   Dialysis Orders:  TTS at [facility] - last 11/19 for which she completed 1/2 HD prior to being sent to ED 3:30hr, 400/800, EDW 48kg, 3K/2.25Ca, AVF, heparin 2200 bolus - Hectoral 61mcg IV q HD - Venofer 50mg  IV weekly - Mircera 60mcg IV q 2 weeks (last 11/10)  Assessment/Plan: 1.  Acute on chronic pancreatitis: Lipase ^ 152, now trending down. Pain control per primary. Of note, she has planned nerve block on 11/24 to address this issue - really wants to be make appt. 2.  ESRD: Will continue HD on usual  TTS sched - next tomorrow (11/21). 3.  Hypertension/volume: BP up with pulm edema on CT - offered extra HD today, but she declines - will try to challenge her EDW down with next treatment. 4.  Anemia: Hgb 12.2 - does not need ESA at this time. 5.  Metabolic bone disease: Ca/Phos controlled. Resume binder Lorin Picket) once eating. Continue Hect. 6.  Nutrition: Alb low - on clears for now. 7.  T2DM: Per primary. 8.  CAD (Hx CABG) 9.  PAD 10.  COPD  Pollyann Kennedy 07/27/2019, 9:56 AM  [facility] Pager: [phone]  Patient seen and examined, agree with above note with above modifications.  Pt still in pain to my interview, requiring IV pain meds -  We will arrange her routine HD tomorrow and will provide her routine HD while here in house -  I agree that EDW likely will need decrease at D/C  Corliss Parish, MD 07/27/2019

## 2019-07-27 NOTE — Progress Notes (Signed)
PROGRESS NOTE  [name]  DOB: [birth year]  PCP: Bernerd Limbo, MD KDX:[id number]  DOA: 07/26/2019  LOS: 1 day   Chief Complaint  Patient presents with  . Abdominal Pain   Brief narrative: [name] is a 68 y.o. female with history of chronic pancreatitis of unknown cause, ESRD on HD on TTS, CAD s/p CABG, abdominal aortic aneurysm, COPD DM2.  She presented to the ED on 11/19 with progressively worsening abdominal pain for 4 days associated with multiple episodes of vomiting, one episode of diarrhea.  In the ED, patient required multiple doses of Dilaudid for pain relief. Lipase was elevated 252, Liver enzymes normal. WBC count 11.7. CT abdomen and pelvis showed groove pancreatitis in a background of pancreatic calcification from remote/chronic pancreatitis. Patient was admitted to hospitalist medicine service for further evaluation and management.  Subjective: Patient was seen and examined this morning.  Pleasant elderly Caucasian female.  Lying down in bed.  Mild to moderate distress from pain.  On scheduled IV Dilaudid. Currently on clear liquid diet only.  No vomiting.  Assessment/Plan: Acute on chronic pancreatitis -Presented with abdominal pain, vomiting. -Hydration in the ED.  Unable to start on aggressive hydration because of dialysis status. -Continue conservative management with pain control, bowel rest and frequent reassessment. -Repeat lipase tomorrow. -Patient states that she is scheduled to have a nerve block next week Tuesday 11/24.   ESRD on HD-TTS Essential hypertension -Home meds include amlodipine, hydralazine, beta-blocker  CAD status post CABG -Currently not having chest pain. -Continue beta-blocker, statin.  History of A. Fib -Status post Maze procedure.  Not on anticoagulation.  COPD -stable.  History of abdominal aortic aneurysm -Status post stenting.  Mobility: Encourage ambulation DVT prophylaxis:  Heparin subcu Code Status:   Code  Status: Full Code  Family Communication:  Not at bedside Expected Discharge:  Continue inpatient management  Consultants:  Nephrology  Procedures:    Antimicrobials: Anti-infectives (From admission, onward)   None      Diet Order            Diet clear liquid Room service appropriate? Yes; Fluid consistency: Thin  Diet effective now              Infusions:    Scheduled Meds: . allopurinol  100 mg Oral Daily  . amLODipine  5 mg Oral Daily  . atorvastatin  40 mg Oral Daily  . carvedilol  25 mg Oral BID WC  . gabapentin  300 mg Oral QHS  . heparin  5,000 Units Subcutaneous Q8H  . hydrALAZINE  25 mg Oral Q8H  . lipase/protease/amylase  24,000 Units Oral TID AC  . pantoprazole  40 mg Oral Daily  . traZODone  150 mg Oral QHS    PRN meds: acetaminophen **OR** acetaminophen, albuterol, calcium carbonate, HYDROmorphone (DILAUDID) injection, labetalol, ondansetron **OR** ondansetron (ZOFRAN) IV   Objective: Vitals:   07/27/19 0410 07/27/19 0550  BP:  (!) 186/64  Pulse:  78  Resp:  16  Temp:  98.8 F (37.1 C)  SpO2: 94% 93%    Intake/Output Summary (Last 24 hours) at 07/27/2019 1002 Last data filed at 07/27/2019 0600 Gross per 24 hour  Intake 1010 ml  Output -  Net 1010 ml   Filed Weights   07/26/19 1653 07/27/19 0601  Weight: 48.1 kg 47.5 kg   Weight change:  Body mass index is 17.43 kg/m.   Physical Exam: General exam: Mild to moderate distress because of pain. Skin: No rashes, lesions  or ulcers. HEENT: Atraumatic, normocephalic, supple neck, no obvious bleeding Lungs: Clear to auscultation bilaterally CVS: Regular rate and rhythm, no murmur GI/Abd soft, moderate tenderness in epigastrium and right upper quadrant, bowel sound present CNS: Alert, awake oriented x3 Psychiatry: Mood appropriate Extremities: No pedal edema, no calf tenderness  Data Review: I have personally reviewed the laboratory data and studies available.  Recent Labs  Lab  07/26/19 1420 07/27/19 0802  WBC 11.7* 9.7  NEUTROABS  --  7.9*  HGB 12.9 12.2  HCT 38.6 37.4  MCV 96.7 98.7  PLT 273 248   Recent Labs  Lab 07/26/19 1420 07/27/19 0802  NA 137 136  K 3.6 4.4  CL 99 99  CO2 24 24  GLUCOSE 119* 102*  BUN 6* 11  CREATININE 1.58* 1.99*  CALCIUM 8.1* 8.2*  MG  --  1.7  PHOS  --  4.4   Recent Labs  Lab 07/26/19 1420 07/27/19 0802  LIPASE 152* 122*   Recent Labs  Lab 07/26/19 1420  AST 18  ALT 11  ALKPHOS 80  BILITOT 0.8  PROT 5.6*  ALBUMIN 2.7*    Terrilee Croak, MD  [facility] 07/27/2019

## 2019-07-27 NOTE — Plan of Care (Addendum)
Pain control focus of patients concern. Explained prn medication Pt arrived from ED around [place]. Pt has cell phone, clothes, shoes and suit case and pocketbook. Resp even and unlabored skin warm and dry. Patient says at baseline she wears oxygen at 2l/min via N/C

## 2019-07-27 NOTE — Plan of Care (Signed)
Continue to manage pain

## 2019-07-28 LAB — CBC WITH DIFFERENTIAL/PLATELET
Abs Immature Granulocytes: 0.06 10*3/uL (ref 0.00–0.07)
Basophils Absolute: 0.1 10*3/uL (ref 0.0–0.1)
Basophils Relative: 1 %
Eosinophils Absolute: 0.2 10*3/uL (ref 0.0–0.5)
Eosinophils Relative: 3 %
HCT: 35.6 % — ABNORMAL LOW (ref 36.0–46.0)
Hemoglobin: 11.8 g/dL — ABNORMAL LOW (ref 12.0–15.0)
Immature Granulocytes: 1 %
Lymphocytes Relative: 8 %
Lymphs Abs: 0.6 10*3/uL — ABNORMAL LOW (ref 0.7–4.0)
MCH: 32.5 pg (ref 26.0–34.0)
MCHC: 33.1 g/dL (ref 30.0–36.0)
MCV: 98.1 fL (ref 80.0–100.0)
Monocytes Absolute: 0.5 10*3/uL (ref 0.1–1.0)
Monocytes Relative: 6 %
Neutro Abs: 6.3 10*3/uL (ref 1.7–7.7)
Neutrophils Relative %: 81 %
Platelets: 194 10*3/uL (ref 150–400)
RBC: 3.63 MIL/uL — ABNORMAL LOW (ref 3.87–5.11)
RDW: 15.9 % — ABNORMAL HIGH (ref 11.5–15.5)
WBC: 7.7 10*3/uL (ref 4.0–10.5)
nRBC: 0 % (ref 0.0–0.2)

## 2019-07-28 LAB — BASIC METABOLIC PANEL
Anion gap: 8 (ref 5–15)
BUN: 11 mg/dL (ref 8–23)
CO2: 26 mmol/L (ref 22–32)
Calcium: 7.9 mg/dL — ABNORMAL LOW (ref 8.9–10.3)
Chloride: 101 mmol/L (ref 98–111)
Creatinine, Ser: 2.14 mg/dL — ABNORMAL HIGH (ref 0.44–1.00)
GFR calc Af Amer: 27 mL/min — ABNORMAL LOW (ref >60)
GFR calc non Af Amer: 23 mL/min — ABNORMAL LOW (ref >60)
Glucose, Bld: 85 mg/dL (ref 70–99)
Potassium: 3.7 mmol/L (ref 3.5–5.1)
Sodium: 135 mmol/L (ref 135–145)

## 2019-07-28 LAB — GLUCOSE, CAPILLARY
Glucose-Capillary: 110 mg/dL — ABNORMAL HIGH (ref 70–99)
Glucose-Capillary: 88 mg/dL (ref 70–99)
Glucose-Capillary: 96 mg/dL (ref 70–99)

## 2019-07-28 LAB — HEPATITIS B SURFACE ANTIGEN: Hepatitis B Surface Ag: NONREACTIVE

## 2019-07-28 LAB — LIPASE, BLOOD: Lipase: 71 U/L — ABNORMAL HIGH (ref 11–51)

## 2019-07-28 MED ORDER — HYDROMORPHONE HCL 1 MG/ML IJ SOLN
INTRAMUSCULAR | Status: AC
  Filled 2019-07-28: qty 1

## 2019-07-28 MED ORDER — DOXERCALCIFEROL 4 MCG/2ML IV SOLN
INTRAVENOUS | Status: AC
  Filled 2019-07-28: qty 2

## 2019-07-28 MED ORDER — DOXERCALCIFEROL 4 MCG/2ML IV SOLN
1.0000 ug | INTRAVENOUS | Status: DC
  Administered 2019-07-28: 1 ug via INTRAVENOUS
  Filled 2019-07-28: qty 2

## 2019-07-28 MED ORDER — NICOTINE 14 MG/24HR TD PT24
14.0000 mg | MEDICATED_PATCH | Freq: Every day | TRANSDERMAL | Status: DC
  Administered 2019-07-28 – 2019-07-30 (×3): 14 mg via TRANSDERMAL
  Filled 2019-07-28 (×3): qty 1

## 2019-07-28 NOTE — Progress Notes (Signed)
PROGRESS NOTE  [name]  DOB: 1951-09-04  PCP: Bernerd Limbo, MD NFA:[id number]  DOA: 07/26/2019  LOS: 2 days   Chief Complaint  Patient presents with  . Abdominal Pain   Brief narrative: [name] is a 68 y.o. female with history of chronic pancreatitis of unknown cause, ESRD on HD on TTS, CAD s/p CABG, abdominal aortic aneurysm, COPD DM2.  She presented to the ED on 11/19 with progressively worsening abdominal pain for 4 days associated with multiple episodes of vomiting, one episode of diarrhea.  In the ED, patient required multiple doses of Dilaudid for pain relief. Lipase was elevated 252, Liver enzymes normal. WBC count 11.7. CT abdomen and pelvis showed groove pancreatitis in a background of pancreatic calcification from remote/chronic pancreatitis. Patient was admitted to hospitalist medicine service for further evaluation and management.  Subjective: Patient was seen and examined this morning.  Curled up in bed because of pain.  Continue scheduled IV Dilaudid.  I increase the dose last night from 0.5 to 1 mg every 2 hours.  I will keep the same dose for today.    Assessment/Plan: Acute on chronic pancreatitis -Presented with abdominal pain, vomiting. -Hydration in the ED.  Unable to start on aggressive hydration because of dialysis status. -Continue conservative management with pain control, bowel rest and frequent reassessment. -Lipase level improving, down to 71 this morning however, patient continues to have pain.  Continue Dilaudid 1 mg IV every 2 hours as needed. -Continue clear liquid diet.  I would not advance it today. -Patient states that she is scheduled to have a nerve block next week Tuesday 11/24.   She does not want to miss that appointment.  ESRD on HD-TTS Essential hypertension -Home meds include amlodipine, hydralazine, beta-blocker. -Continue all.  Blood pressure seems to be on the higher side probably because of pain.  CAD status post CABG  -Currently not having chest pain. -Continue beta-blocker, statin.  History of A. Fib -Status post Maze procedure.  Not on anticoagulation.  COPD -stable.  History of abdominal aortic aneurysm -Status post stenting.  Mobility: Encourage ambulation DVT prophylaxis:  Heparin subcu Code Status:   Code Status: Full Code  Family Communication:  Not at bedside Expected Discharge:  Continue inpatient management  Consultants:  Nephrology  Procedures:    Antimicrobials: Anti-infectives (From admission, onward)   None      Diet Order            Diet clear liquid Room service appropriate? Yes; Fluid consistency: Thin  Diet effective now              Infusions:    Scheduled Meds: . allopurinol  100 mg Oral Daily  . amLODipine  5 mg Oral Daily  . atorvastatin  40 mg Oral Daily  . carvedilol  25 mg Oral BID WC  . Chlorhexidine Gluconate Cloth  6 each Topical Q0600  . doxercalciferol  1 mcg Intravenous Q T,Th,Sa-HD  . gabapentin  300 mg Oral QHS  . heparin  5,000 Units Subcutaneous Q8H  . hydrALAZINE  25 mg Oral Q8H  . lipase/protease/amylase  24,000 Units Oral TID AC  . pantoprazole  40 mg Oral Daily  . traZODone  150 mg Oral QHS    PRN meds: acetaminophen **OR** acetaminophen, albuterol, calcium carbonate, HYDROmorphone (DILAUDID) injection, labetalol, ondansetron **OR** ondansetron (ZOFRAN) IV   Objective: Vitals:   07/28/19 0519 07/28/19 0926  BP: (!) 174/59 (!) 160/51  Pulse: 70 73  Resp: 15 18  Temp: 98.4 F (36.9 C) 98.9 F (37.2 C)  SpO2: 90% 100%   No intake or output data in the 24 hours ending 07/28/19 1014 Filed Weights   07/26/19 1653 07/27/19 0601 07/28/19 0500  Weight: 48.1 kg 47.5 kg 46.8 kg   Weight change: -1.281 kg Body mass index is 17.17 kg/m.   Physical Exam: General exam: Curled up in bed because of pain Skin: No rashes, lesions or ulcers. HEENT: Atraumatic, normocephalic, supple neck, no obvious bleeding Lungs: Clear to  auscultation bilaterally CVS: Regular rate and rhythm, no murmur GI/Abd soft, tenderness present in epigastrium and right upper quadrant.  Bowel sounds present.   CNS: Alert, awake oriented x3 Psychiatry: Sad affect Extremities: No pedal edema, no calf tenderness  Data Review: I have personally reviewed the laboratory data and studies available.  Recent Labs  Lab 07/26/19 1420 07/27/19 0802 07/28/19 0439  WBC 11.7* 9.7 7.7  NEUTROABS  --  7.9* 6.3  HGB 12.9 12.2 11.8*  HCT 38.6 37.4 35.6*  MCV 96.7 98.7 98.1  PLT 273 248 194   Recent Labs  Lab 07/26/19 1420 07/27/19 0802 07/28/19 0439  NA 137 136 135  K 3.6 4.4 3.7  CL 99 99 101  CO2 24 24 26   GLUCOSE 119* 102* 85  BUN 6* 11 11  CREATININE 1.58* 1.99* 2.14*  CALCIUM 8.1* 8.2* 7.9*  MG  --  1.7  --   PHOS  --  4.4  --    Recent Labs  Lab 07/26/19 1420 07/27/19 0802 07/28/19 0439  LIPASE 152* 122* 71*   Recent Labs  Lab 07/26/19 1420  AST 18  ALT 11  ALKPHOS 80  BILITOT 0.8  PROT 5.6*  ALBUMIN 2.7*    Terrilee Croak, MD  [facility] 07/28/2019

## 2019-07-28 NOTE — Progress Notes (Addendum)
[facility] Progress Note   Subjective:  Seen in room. No CP/dyspnea overnight. Still with abd pain. She is due for dialysis today - will be done this afternoon.  Objective Vitals:   07/27/19 1710 07/27/19 2131 07/28/19 0500 07/28/19 0519  BP: (!) 159/64 (!) 146/50  (!) 174/59  Pulse: 80 64  70  Resp: 17 16  15   Temp: 99 F (37.2 C) 98.7 F (37.1 C)  98.4 F (36.9 C)  TempSrc: Oral Oral  Oral  SpO2: 95% 93%  90%  Weight:   46.8 kg   Height:       Physical Exam General: Well appearing woman, NAD Heart: RRR; 2/6 systolic murmur Lungs: CTA anteriorly Abdomen:soft, mild generalized tenderness across upper abdomen Extremities: No LE edema Dialysis Access: L AVF + thrill  Additional Objective Labs: Basic Metabolic Panel: Recent Labs  Lab 07/26/19 1420 07/27/19 0802 07/28/19 0439  NA 137 136 135  K 3.6 4.4 3.7  CL 99 99 101  CO2 24 24 26   GLUCOSE 119* 102* 85  BUN 6* 11 11  CREATININE 1.58* 1.99* 2.14*  CALCIUM 8.1* 8.2* 7.9*  PHOS  --  4.4  --    Liver Function Tests: Recent Labs  Lab 07/26/19 1420  AST 18  ALT 11  ALKPHOS 80  BILITOT 0.8  PROT 5.6*  ALBUMIN 2.7*   Recent Labs  Lab 07/26/19 1420 07/27/19 0802 07/28/19 0439  LIPASE 152* 122* 71*   CBC: Recent Labs  Lab 07/26/19 1420 07/27/19 0802 07/28/19 0439  WBC 11.7* 9.7 7.7  NEUTROABS  --  7.9* 6.3  HGB 12.9 12.2 11.8*  HCT 38.6 37.4 35.6*  MCV 96.7 98.7 98.1  PLT 273 248 194   Studies/Results: Ct Abdomen Pelvis Wo Contrast  Result Date: 07/27/2019 CLINICAL DATA:  Nausea and vomiting.  Mid abdominal pain. EXAM: CT ABDOMEN AND PELVIS WITHOUT CONTRAST TECHNIQUE: Multidetector CT imaging of the abdomen and pelvis was performed following the standard protocol without IV contrast. COMPARISON:  05/12/2019 FINDINGS: Lower chest: Normal heart size. Coronary calcification that is extensive. Mild atelectasis in the lower lungs. Septal thickening and trace pleural effusions in the  lower lungs. Hepatobiliary: No focal liver abnormality.Cholecystectomy. No bile duct dilatation or visible calcified cholelithiasis. Pancreas: Fullness of the pancreatic head with indistinct margins and coarse calcifications. There is contiguous inflammation of the mid duodenum with submucosal low-density thickening of folds. No gastric outlet obstruction or evident perforation. Spleen: Unremarkable. Adrenals/Urinary Tract: Negative adrenals. Multiple hilar calcifications from some combination of urinary calculi and atherosclerosis. No hydronephrosis. Unremarkable bladder. Stomach/Bowel: No obstruction. No evidence of bowel inflammation outside of the duodenal findings noted above. Postoperative sigmoid colon. Appendectomy. Vascular/Lymphatic: No acute vascular abnormality. Aortic iliac stent grafting for aneurysm. There is been bilateral renal artery stenting. 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 Heavily calcified and collapsed appearing left SFA and right SFA. No mass or adenopathy. Reproductive:Hysterectomy Other: No ascites or pneumoperitoneum. Musculoskeletal: No acute abnormalities. IMPRESSION: 1. Groove pancreatitis. Inflammation is greater than seen on CT in September 2020. There is a background of pancreatic calcification from remote/chronic pancreatitis. 2. Interstitial pulmonary edema and trace pleural effusions. Severe atherosclerosis with aortic aneurysm stent grafting. Electronically Signed   By: Monte Fantasia M.D.   On: 07/27/2019 07:19   Dg Chest 2 View  Result Date: 07/26/2019 CLINICAL DATA:  Possible pneumothorax EXAM: CHEST - 2 VIEW COMPARISON:  07/26/2019, 08/14/2018 FINDINGS: Post sternotomy changes and atrial appendage clip. Stents arising  from the aortic arch and at the left axillary region. Mildly coarse chronic interstitial opacity. No consolidation or pleural effusion. Aortic atherosclerosis. No definitive pneumothorax is seen. IMPRESSION:  1. No definitive pneumothorax identified 2. No acute airspace disease. Electronically Signed   By: Donavan Foil M.D.   On: 07/26/2019 19:03   Dg Abd 1 View  Result Date: 07/26/2019 CLINICAL DATA:  Nausea and vomiting. History of pancreatitis. EXAM: ABDOMEN - 1 VIEW COMPARISON:  Most recent CT 05/12/2019 FINDINGS: No evidence of free air. No bowel dilatation to suggest obstruction. Aorto bi-iliac stent graft in place. Cholecystectomy clips in the right upper quadrant. Right upper quadrant calcifications correspond to pancreatic calcifications as well as vascular calcifications on CT. Chain suture noted in the pelvis. Surgical clips in the right inguinal region. Aortic and branch atherosclerosis. Bones are under mineralized. No acute osseous abnormality. IMPRESSION: 1. Normal bowel gas pattern. No evidence of free air. 2. Right upper quadrant calcifications correspond to pancreatic calcifications on CT. Electronically Signed   By: Keith Rake M.D.   On: 07/26/2019 23:48   Dg Chest Port 1 View  Result Date: 07/26/2019 CLINICAL DATA:  RIGHT upper quadrant epigastric pain for 5 days, history CABG and MVR, hypertension, diabetes mellitus, CHF, COPD, pancreatitis EXAM: PORTABLE CHEST 1 VIEW COMPARISON:  Portable exam 1737 hours compared to 08/14/2018 FINDINGS: Normal heart size post CABG, MVR, and LEFT atrial appendage clipping. Mediastinal contours and pulmonary vascularity normal. Vascular stents identified arising from aortic arch and at the LEFT axilla. Atherosclerotic calcification aorta. Lungs appear emphysematous but clear. Question skin fold versus less likely pneumothorax at lateral RIGHT upper chest. Remaining lungs clear. No pleural effusion or segmental consolidation. Bones demineralized. IMPRESSION: COPD changes with questionable skin fold versus pneumothorax at the lateral upper RIGHT chest, favor skin fold but recommend upright PA chest radiograph to exclude pneumothorax. Findings called to  Wyn Quaker PA on 07/26/2019 at 1752 hrs. Electronically Signed   By: Lavonia Dana M.D.   On: 07/26/2019 17:52   Medications:  . allopurinol  100 mg Oral Daily  . amLODipine  5 mg Oral Daily  . atorvastatin  40 mg Oral Daily  . carvedilol  25 mg Oral BID WC  . Chlorhexidine Gluconate Cloth  6 each Topical Q0600  . gabapentin  300 mg Oral QHS  . heparin  5,000 Units Subcutaneous Q8H  . hydrALAZINE  25 mg Oral Q8H  . lipase/protease/amylase  24,000 Units Oral TID AC  . pantoprazole  40 mg Oral Daily  . traZODone  150 mg Oral QHS    Dialysis Orders: TTS at [facility] - last 11/19 for which she completed 1/2 HD prior to being sent to ED 3:30hr, 400/800, EDW 48kg, 3K/2.25Ca, AVF, heparin 2200 bolus - Hectoral 49mcg IV q HD - Venofer 50mg  IV weekly - Mircera 24mcg IV q 2 weeks (last 11/10)  Assessment/Plan: 1.  Acute on chronic pancreatitis: Lipase ^ 152, now trending down. Pain control per primary. She has planned outpatient nerve block on 11/24 to address this issue - really wants to be make appt. 2.  ESRD: Will continue HD on usual TTS sched - HD today (11/21). Of note - next week she will be on holiday HD schedule with dialysis scheduled for [facility]. 3.  Hypertension/volume: BP up with pulm edema on CT - will try to challenge her EDW down as tolerated 4.  Anemia: Hgb 11.8 - does not need ESA at this time. 5.  Metabolic bone disease: Ca/Phos controlled. Resume  binder Lorin Picket) once eating. Continue Hect. 6.  Nutrition: Alb low - on clears for now. 7.  T2DM: Per primary. 8. CAD (Hx CABG) 9.  PAD 10. COPD  Pollyann Kennedy 07/28/2019, 8:42 AM  [facility] Pager: [phone]  Patient seen and examined, agree with above note with above modifications. Minimal improvement in abd pain-  Due for HD later today.  Next week on holiday schedule -  Pt hoping that she can make her OP appt for nerve block for this chronic pain - is on Tuesday  Corliss Parish,  MD 07/28/2019

## 2019-07-28 NOTE — Progress Notes (Signed)
Report given to HD staff, Ronny Bacon. TELE notify.  Ave Filter, RN

## 2019-07-29 LAB — BASIC METABOLIC PANEL
Anion gap: 5 (ref 5–15)
BUN: <5 mg/dL — ABNORMAL LOW (ref 8–23)
CO2: 30 mmol/L (ref 22–32)
Calcium: 7.8 mg/dL — ABNORMAL LOW (ref 8.9–10.3)
Chloride: 104 mmol/L (ref 98–111)
Creatinine, Ser: 1.3 mg/dL — ABNORMAL HIGH (ref 0.44–1.00)
GFR calc Af Amer: 50 mL/min — ABNORMAL LOW (ref >60)
GFR calc non Af Amer: 43 mL/min — ABNORMAL LOW (ref >60)
Glucose, Bld: 79 mg/dL (ref 70–99)
Potassium: 3.8 mmol/L (ref 3.5–5.1)
Sodium: 139 mmol/L (ref 135–145)

## 2019-07-29 LAB — LIPASE, BLOOD: Lipase: 62 U/L — ABNORMAL HIGH (ref 11–51)

## 2019-07-29 LAB — CBC WITH DIFFERENTIAL/PLATELET
Abs Immature Granulocytes: 0.03 10*3/uL (ref 0.00–0.07)
Basophils Absolute: 0.1 10*3/uL (ref 0.0–0.1)
Basophils Relative: 1 %
Eosinophils Absolute: 0.3 10*3/uL (ref 0.0–0.5)
Eosinophils Relative: 5 %
HCT: 32.7 % — ABNORMAL LOW (ref 36.0–46.0)
Hemoglobin: 10.2 g/dL — ABNORMAL LOW (ref 12.0–15.0)
Immature Granulocytes: 1 %
Lymphocytes Relative: 14 %
Lymphs Abs: 0.8 10*3/uL (ref 0.7–4.0)
MCH: 31.4 pg (ref 26.0–34.0)
MCHC: 31.2 g/dL (ref 30.0–36.0)
MCV: 100.6 fL — ABNORMAL HIGH (ref 80.0–100.0)
Monocytes Absolute: 0.5 10*3/uL (ref 0.1–1.0)
Monocytes Relative: 9 %
Neutro Abs: 3.8 10*3/uL (ref 1.7–7.7)
Neutrophils Relative %: 70 %
Platelets: 167 10*3/uL (ref 150–400)
RBC: 3.25 MIL/uL — ABNORMAL LOW (ref 3.87–5.11)
RDW: 15.9 % — ABNORMAL HIGH (ref 11.5–15.5)
WBC: 5.4 10*3/uL (ref 4.0–10.5)
nRBC: 0 % (ref 0.0–0.2)

## 2019-07-29 LAB — GLUCOSE, CAPILLARY
Glucose-Capillary: 117 mg/dL — ABNORMAL HIGH (ref 70–99)
Glucose-Capillary: 117 mg/dL — ABNORMAL HIGH (ref 70–99)
Glucose-Capillary: 130 mg/dL — ABNORMAL HIGH (ref 70–99)
Glucose-Capillary: 81 mg/dL (ref 70–99)
Glucose-Capillary: 89 mg/dL (ref 70–99)

## 2019-07-29 MED ORDER — CHLORHEXIDINE GLUCONATE CLOTH 2 % EX PADS
6.0000 | MEDICATED_PAD | Freq: Every day | CUTANEOUS | Status: DC
  Administered 2019-07-29 – 2019-07-30 (×2): 6 via TOPICAL

## 2019-07-29 NOTE — Progress Notes (Signed)
PROGRESS NOTE  [name]  DOB: [birth year]  PCP: Bernerd Limbo, MD YTK:[id number]  DOA: 07/26/2019  LOS: 3 days   Chief Complaint  Patient presents with  . Abdominal Pain   Brief narrative: [name] is a 68 y.o. female with history of chronic pancreatitis of unknown cause, ESRD on HD on TTS, CAD s/p CABG, abdominal aortic aneurysm, COPD DM2.  She presented to the ED on 11/19 with progressively worsening abdominal pain for 4 days associated with multiple episodes of vomiting, one episode of diarrhea.  In the ED, patient required multiple doses of Dilaudid for pain relief. Lipase was elevated 252, Liver enzymes normal. WBC count 11.7. CT abdomen and pelvis showed groove pancreatitis in a background of pancreatic calcification from remote/chronic pancreatitis. Patient was admitted to hospitalist medicine service for further evaluation and management.  Subjective: Patient was seen and examined this morning.  Patient complains that her pain is still 6 out of 10 at the best and she is requiring IV Dilaudid 1 mg every 2 hours.  Lipase level trending down.  Patient wants to advance diet to full liquid for lunch.    Assessment/Plan: Acute on chronic pancreatitis -Presented with abdominal pain, vomiting. -Hydration in the ED.  Unable to start on aggressive hydration because of dialysis status. -Continue conservative management with pain control, bowel rest and frequent reassessment. -Lipase level improving, down to 62 this morning however, patient continues to have pain.  Continue Dilaudid 1 mg IV every 2 hours as needed. -Patient was to advance diet to full liquid today.   -Patient states that she is scheduled to have a nerve block next week Tuesday 11/24.   She does not want to miss that appointment.  Patient is expecting to be discharged tomorrow.  ESRD on HD-TTS Essential hypertension -Home meds include amlodipine, hydralazine, beta-blocker. -Continue all.  Blood pressure seems  to be on the higher side probably because of pain.  CAD status post CABG -Currently not having chest pain. -Continue beta-blocker, statin.  History of A. Fib -Status post Maze procedure.  Not on anticoagulation.  COPD -stable.  History of abdominal aortic aneurysm -Status post stenting.  Mobility: Encourage ambulation DVT prophylaxis:  Heparin subcu Code Status:   Code Status: Full Code  Family Communication:  Not at bedside Expected Discharge:  Patient is expecting a discharge tomorrow.  Consultants:  Nephrology  Procedures:    Antimicrobials: Anti-infectives (From admission, onward)   None      Diet Order            Diet full liquid Room service appropriate? Yes; Fluid consistency: Thin  Diet effective now              Infusions:    Scheduled Meds: . allopurinol  100 mg Oral Daily  . amLODipine  5 mg Oral Daily  . atorvastatin  40 mg Oral Daily  . carvedilol  25 mg Oral BID WC  . Chlorhexidine Gluconate Cloth  6 each Topical Q0600  . doxercalciferol  1 mcg Intravenous Q T,Th,Sa-HD  . gabapentin  300 mg Oral QHS  . heparin  5,000 Units Subcutaneous Q8H  . hydrALAZINE  25 mg Oral Q8H  . lipase/protease/amylase  24,000 Units Oral TID AC  . nicotine  14 mg Transdermal Daily  . pantoprazole  40 mg Oral Daily  . traZODone  150 mg Oral QHS    PRN meds: acetaminophen **OR** acetaminophen, albuterol, calcium carbonate, HYDROmorphone (DILAUDID) injection, labetalol, ondansetron **OR** ondansetron (ZOFRAN) IV  Objective: Vitals:   07/29/19 0740 07/29/19 1215  BP: (!) 178/61 (!) 163/63  Pulse: 65 71  Resp: 17 18  Temp: 98.8 F (37.1 C) 98.3 F (36.8 C)  SpO2: 97% 98%    Intake/Output Summary (Last 24 hours) at 07/29/2019 1258 Last data filed at 07/29/2019 1229 Gross per 24 hour  Intake 360 ml  Output 2000 ml  Net -1640 ml   Filed Weights   07/28/19 0500 07/28/19 1315 07/28/19 1654  Weight: 46.8 kg 48.1 kg 46.1 kg   Weight change: 1.3 kg  Body mass index is 16.91 kg/m.   Physical Exam: General exam: Mild to moderate distress because of pain Skin: No rashes, lesions or ulcers. HEENT: Atraumatic, normocephalic, supple neck, no obvious bleeding Lungs: Clear to auscultation bilaterally CVS: Regular rate and rhythm, no murmur GI/Abd soft, tenderness present in epigastrium and right upper quadrant.  Bowel sounds present.   CNS: Alert, awake oriented x3 Psychiatry: Depressed affect Extremities: No pedal edema, no calf tenderness  Data Review: I have personally reviewed the laboratory data and studies available.  Recent Labs  Lab 07/26/19 1420 07/27/19 0802 07/28/19 0439 07/29/19 0511  WBC 11.7* 9.7 7.7 5.4  NEUTROABS  --  7.9* 6.3 3.8  HGB 12.9 12.2 11.8* 10.2*  HCT 38.6 37.4 35.6* 32.7*  MCV 96.7 98.7 98.1 100.6*  PLT 273 248 194 167   Recent Labs  Lab 07/26/19 1420 07/27/19 0802 07/28/19 0439 07/29/19 0511  NA 137 136 135 139  K 3.6 4.4 3.7 3.8  CL 99 99 101 104  CO2 24 24 26 30   GLUCOSE 119* 102* 85 79  BUN 6* 11 11 <5*  CREATININE 1.58* 1.99* 2.14* 1.30*  CALCIUM 8.1* 8.2* 7.9* 7.8*  MG  --  1.7  --   --   PHOS  --  4.4  --   --    Recent Labs  Lab 07/26/19 1420 07/27/19 0802 07/28/19 0439 07/29/19 0511  LIPASE 152* 122* 71* 62*   Recent Labs  Lab 07/26/19 1420  AST 18  ALT 11  ALKPHOS 80  BILITOT 0.8  PROT 5.6*  ALBUMIN 2.7*    Terrilee Croak, MD  [facility] 07/29/2019

## 2019-07-29 NOTE — Progress Notes (Signed)
Patient unable to tolerate full liquid, c/o increase pain and nausea. Change diet to clear diet.MD notify.  Ave Filter, RN

## 2019-07-29 NOTE — Progress Notes (Addendum)
[facility] Progress Note   Subjective:  Seen in room - abd pain ok this morning, but says pain was much worse yesterday after HD - had 2L UF yest and seemed to tolerate. Denies CP/dyspnea.  Objective Vitals:   07/28/19 1654 07/28/19 2308 07/29/19 0521 07/29/19 0740  BP: (!) 140/57 (!) 154/71 (!) 153/56 (!) 178/61  Pulse: 72 62 (!) 59 65  Resp: 18 15 16 17   Temp: 98.4 F (36.9 C) 98.4 F (36.9 C) 98.5 F (36.9 C) 98.8 F (37.1 C)  TempSrc: Oral Oral Oral Oral  SpO2: 95% 96% 90% 97%  Weight: 46.1 kg     Height:       Physical Exam General: Well appearing woman, NAD Heart: RRR; 2/6 systolic murmur Lungs: CTA anteriorly Abdomen:soft, mild generalized tenderness across upper abdomen Extremities: No LE edema Dialysis Access: L AVF + thrill  Additional Objective Labs: Basic Metabolic Panel: Recent Labs  Lab 07/27/19 0802 07/28/19 0439 07/29/19 0511  NA 136 135 139  K 4.4 3.7 3.8  CL 99 101 104  CO2 24 26 30   GLUCOSE 102* 85 79  BUN 11 11 <5*  CREATININE 1.99* 2.14* 1.30*  CALCIUM 8.2* 7.9* 7.8*  PHOS 4.4  --   --    Liver Function Tests: Recent Labs  Lab 07/26/19 1420  AST 18  ALT 11  ALKPHOS 80  BILITOT 0.8  PROT 5.6*  ALBUMIN 2.7*   Recent Labs  Lab 07/27/19 0802 07/28/19 0439 07/29/19 0511  LIPASE 122* 71* 62*   CBC: Recent Labs  Lab 07/26/19 1420 07/27/19 0802 07/28/19 0439 07/29/19 0511  WBC 11.7* 9.7 7.7 5.4  NEUTROABS  --  7.9* 6.3 3.8  HGB 12.9 12.2 11.8* 10.2*  HCT 38.6 37.4 35.6* 32.7*  MCV 96.7 98.7 98.1 100.6*  PLT 273 248 194 167   CBG: Recent Labs  Lab 07/28/19 0003 07/28/19 0601 07/28/19 1132 07/29/19 0028 07/29/19 0547  GLUCAP 96 88 110* 117* 81   Medications:  . allopurinol  100 mg Oral Daily  . amLODipine  5 mg Oral Daily  . atorvastatin  40 mg Oral Daily  . carvedilol  25 mg Oral BID WC  . Chlorhexidine Gluconate Cloth  6 each Topical Q0600  . doxercalciferol  1 mcg Intravenous Q T,Th,Sa-HD   . gabapentin  300 mg Oral QHS  . heparin  5,000 Units Subcutaneous Q8H  . hydrALAZINE  25 mg Oral Q8H  . lipase/protease/amylase  24,000 Units Oral TID AC  . nicotine  14 mg Transdermal Daily  . pantoprazole  40 mg Oral Daily  . traZODone  150 mg Oral QHS    Dialysis Orders: TTS at [facility] - last 11/19 for which she completed 1/2 HD prior to being sent to ED 3:30hr, 400/800, EDW 48kg, 3K/2.25Ca, AVF, heparin 2200 bolus - Hectoral 82mcg IV q HD - Venofer 50mg  IV weekly - Mircera 24mcg IV q 2 weeks (last 11/10)  Assessment/Plan: 1. Acute on chronic pancreatitis: Lipase ^ 152 on admit, now trending down. Pain control per primary. She has planned outpatient nerve block on 11/24 to address this issue - really wants to be make appt - plan is to d/c her after HD on 11/23 although she is very concerned that her procedure will be cancelled since she missed her pre-op appt. Tried to call pre-op but closed today - hopefully they will still allow her to have procedure. 2. ESRD:Usual TTS sched - changing to holiday HD schedule for the week -  will be [facility] - next HD tomorrow 11/23. 3. Hypertension/volume:BP up with pulm edema on CT - slightly improved, below prior EDW. 4. Anemia:Hgb 10.2 - due for ESA on 54/98. 5. Metabolic bone disease:Ca/Phos controlled. Resume binder Lorin Picket) once eating. Continue Hect. 6. Nutrition:Alb low - advance diet per primary. 7. T2DM:Per primary. 8. CAD (Hx CABG) 9. PAD 10. COPD  [name] 07/29/2019, 9:23 AM  [facility] Pager: [phone]  Patient seen and examined, agree with above note with above modifications. Looks better in terms of pain. Did have flare of pain last night after HD.  Pt is hoping that she could be discharged tomorrow after HD and be able to get to her pain block appt by Tuesday  Corliss Parish, MD 07/29/2019

## 2019-07-30 LAB — BASIC METABOLIC PANEL
Anion gap: 6 (ref 5–15)
BUN: 6 mg/dL — ABNORMAL LOW (ref 8–23)
CO2: 27 mmol/L (ref 22–32)
Calcium: 7.8 mg/dL — ABNORMAL LOW (ref 8.9–10.3)
Chloride: 104 mmol/L (ref 98–111)
Creatinine, Ser: 1.7 mg/dL — ABNORMAL HIGH (ref 0.44–1.00)
GFR calc Af Amer: 36 mL/min — ABNORMAL LOW (ref >60)
GFR calc non Af Amer: 31 mL/min — ABNORMAL LOW (ref >60)
Glucose, Bld: 120 mg/dL — ABNORMAL HIGH (ref 70–99)
Potassium: 4.1 mmol/L (ref 3.5–5.1)
Sodium: 137 mmol/L (ref 135–145)

## 2019-07-30 LAB — CBC
HCT: 35.8 % — ABNORMAL LOW (ref 36.0–46.0)
Hemoglobin: 11.4 g/dL — ABNORMAL LOW (ref 12.0–15.0)
MCH: 32.2 pg (ref 26.0–34.0)
MCHC: 31.8 g/dL (ref 30.0–36.0)
MCV: 101.1 fL — ABNORMAL HIGH (ref 80.0–100.0)
Platelets: 195 10*3/uL (ref 150–400)
RBC: 3.54 MIL/uL — ABNORMAL LOW (ref 3.87–5.11)
RDW: 15.9 % — ABNORMAL HIGH (ref 11.5–15.5)
WBC: 6.7 10*3/uL (ref 4.0–10.5)
nRBC: 0 % (ref 0.0–0.2)

## 2019-07-30 LAB — CBC WITH DIFFERENTIAL/PLATELET
Abs Immature Granulocytes: 0.05 10*3/uL (ref 0.00–0.07)
Basophils Absolute: 0.1 10*3/uL (ref 0.0–0.1)
Basophils Relative: 1 %
Eosinophils Absolute: 0.2 10*3/uL (ref 0.0–0.5)
Eosinophils Relative: 4 %
HCT: 31.3 % — ABNORMAL LOW (ref 36.0–46.0)
Hemoglobin: 10.2 g/dL — ABNORMAL LOW (ref 12.0–15.0)
Immature Granulocytes: 1 %
Lymphocytes Relative: 13 %
Lymphs Abs: 0.9 10*3/uL (ref 0.7–4.0)
MCH: 32.4 pg (ref 26.0–34.0)
MCHC: 32.6 g/dL (ref 30.0–36.0)
MCV: 99.4 fL (ref 80.0–100.0)
Monocytes Absolute: 0.7 10*3/uL (ref 0.1–1.0)
Monocytes Relative: 10 %
Neutro Abs: 4.7 10*3/uL (ref 1.7–7.7)
Neutrophils Relative %: 71 %
Platelets: 164 10*3/uL (ref 150–400)
RBC: 3.15 MIL/uL — ABNORMAL LOW (ref 3.87–5.11)
RDW: 15.9 % — ABNORMAL HIGH (ref 11.5–15.5)
WBC: 6.6 10*3/uL (ref 4.0–10.5)
nRBC: 0 % (ref 0.0–0.2)

## 2019-07-30 LAB — GLUCOSE, CAPILLARY
Glucose-Capillary: 102 mg/dL — ABNORMAL HIGH (ref 70–99)
Glucose-Capillary: 75 mg/dL (ref 70–99)
Glucose-Capillary: 85 mg/dL (ref 70–99)

## 2019-07-30 LAB — LIPASE, BLOOD: Lipase: 60 U/L — ABNORMAL HIGH (ref 11–51)

## 2019-07-30 MED ORDER — BOOST / RESOURCE BREEZE PO LIQD CUSTOM
1.0000 | Freq: Three times a day (TID) | ORAL | Status: DC
  Administered 2019-07-30: 1 via ORAL

## 2019-07-30 MED ORDER — HYDROMORPHONE HCL 1 MG/ML IJ SOLN
INTRAMUSCULAR | Status: AC
  Administered 2019-07-30: 1 mg via INTRAVENOUS
  Filled 2019-07-30: qty 0.5

## 2019-07-30 MED ORDER — HYDROMORPHONE HCL 1 MG/ML IJ SOLN
INTRAMUSCULAR | Status: AC
  Filled 2019-07-30: qty 0.5

## 2019-07-30 MED ORDER — HYDROMORPHONE HCL 1 MG/ML IJ SOLN
INTRAMUSCULAR | Status: AC
  Administered 2019-07-30: 1 mg via INTRAVENOUS
  Filled 2019-07-30: qty 1

## 2019-07-30 NOTE — TOC Initial Note (Signed)
Transition of Care [facility]) - Initial/Assessment Note    Patient Details  Name: [name] MRN: [id number] Date of Birth: 02-07-1951  Transition of Care [facility]) CM/SW Contact:    [name], RN Phone Number: 07/30/2019, 1:16 PM  Clinical Narrative:                 Confirmed face sheet information with patient. Patient recently moved out of her daughters home into her own home.   Patient has home oxygen , walker and 3 in1 through [facility].   She has portable oxygen tank. She is active with PCP DR Coletta Memos has transportation to appointments and can fill prescriptions.   Patient has had [facility] in the past.  Will continue to follow.   Expected Discharge Plan: Home/Self Care Barriers to Discharge: Continued Medical Work up   Patient Goals and CMS Choice Patient states their goals for this hospitalization and ongoing recovery are:: to return to home CMS Medicare.gov Compare Post Acute Care list provided to:: Patient Choice offered to / list presented to : NA  Expected Discharge Plan and Services Expected Discharge Plan: Home/Self Care   Discharge Planning Services: CM Consult   Living arrangements for the past 2 months: Single Family Home                 DME Arranged: N/A         HH Arranged: NA          Prior Living Arrangements/Services Living arrangements for the past 2 months: Single Family Home Lives with:: Self Patient language and need for interpreter reviewed:: Yes        Need for Family Participation in Patient Care: Yes (Comment) Care giver support system in place?: Yes (comment) Current home services: DME Criminal Activity/Legal Involvement Pertinent to Current Situation/Hospitalization: No - Comment as needed  Activities of Daily Living Home Assistive Devices/Equipment: None ADL Screening (condition at time of admission) Patient's cognitive ability adequate to safely complete daily activities?: Yes Is the patient deaf or have  difficulty hearing?: No Does the patient have difficulty seeing, even when wearing glasses/contacts?: No Does the patient have difficulty concentrating, remembering, or making decisions?: No Patient able to express need for assistance with ADLs?: Yes Does the patient have difficulty dressing or bathing?: No Independently performs ADLs?: Yes (appropriate for developmental age) Does the patient have difficulty walking or climbing stairs?: Yes Weakness of Legs: None Weakness of Arms/Hands: None  Permission Sought/Granted   Permission granted to share information with : No              Emotional Assessment Appearance:: Appears stated age Attitude/Demeanor/Rapport: Engaged Affect (typically observed): Accepting Orientation: : Oriented to Self, Oriented to Place, Oriented to  Time, Oriented to Situation Alcohol / Substance Use: Not Applicable Psych Involvement: No (comment)  Admission diagnosis:  Nausea & vomiting [R11.2] Acute on chronic pancreatitis ([place]) [K85.90, K86.1] Acute pancreatitis [K85.90] Patient Active Problem List   Diagnosis Date Noted  . Acute pancreatitis 07/26/2019  . Acute on chronic pancreatitis ([place]) 05/12/2019  . Abnormal CT of the abdomen 09/25/2018  . Pancreatic divisum 09/25/2018  . Pancreatic mass   . ESRD (end stage renal disease) ([place]) 09/09/2018  . Renal failure (ARF), acute on chronic (HCC) 08/15/2018  . ARF (acute renal failure) ([place]) 08/15/2018  . CKD (chronic kidney disease), stage V ([place]) 08/15/2018  . Hypomagnesemia 08/15/2018  . Acute pulmonary edema ([place]) 08/14/2018  . Goals of care, counseling/discussion   .  DNR (do not resuscitate) discussion   . AKI (acute kidney injury) ([place]) 07/26/2018  . Acute encephalopathy 07/24/2018  . Chronic pain 07/24/2018  . Malnutrition of moderate degree 07/13/2018  . Stage II pressure ulcer ([place]) 07/12/2018  . Fluid collection at surgical site   . Intraabdominal fluid collection   . Cystic duct calculus    . RUQ pain 07/08/2018  . Peripancreatic fluid collection 07/04/2018  . 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 Urinary retention 07/04/2018  . S/P insertion of iliac artery stent 07/04/2018  . Endoleak post (EVAR) endovascular aneurysm repair ([place]) 07/04/2018  . Physical deconditioning   . Esophageal ulcer with bleeding   . Pressure injury of skin 06/16/2018  . Hematemesis   . Palliative care patient   . Palliative care by specialist   . Anasarca 06/07/2018  . Demand ischemia ([place]) 06/07/2018  . Protein-calorie malnutrition, severe 05/29/2018  . Type II diabetes mellitus with renal manifestations ([place]) 05/28/2018  . Recurrent pancreatitis 05/28/2018  . Hypokalemia 05/28/2018  . SIRS (systemic inflammatory response syndrome) ([place]) 05/28/2018  . Acute recurrent pancreatitis 05/28/2018  . Volume overload 04/17/2018  . Anemia due to GI blood loss 04/14/2018  . Acute gallstone pancreatitis 04/14/2018  . Acute cholecystitis 04/14/2018  . Gastrointestinal hemorrhage   . Fatigue associated with anemia 04/09/2018  . Hypoglycemia 01/19/2018  . Type II diabetes mellitus ([place]) 01/19/2018  . Nausea with vomiting 01/19/2018  . Uncontrolled hypertension 01/19/2018  . Hyperlipidemia 01/19/2018  . CAD (coronary artery disease) 01/19/2018  . COPD with hypoxia ([place]) 01/19/2018  . Chronic kidney disease (CKD), stage IV (severe) ([place]) 01/19/2018  . Retroperitoneal hematoma 01/19/2018  . Acute blood loss anemia 01/19/2018  . Chronic diastolic congestive heart failure ([place]) 01/19/2018  . AAA (abdominal aortic aneurysm) ([place]) 01/16/2018  . Elevated brain natriuretic peptide (BNP) level 10/07/2015  . Diabetes mellitus ([place]) 10/07/2015  . Pain in the chest   . PAD (peripheral artery disease) ([place]) 07/29/2015  . Chest pain 07/04/2015  . Acute renal failure superimposed on stage 4 chronic kidney disease ([place]) 07/04/2015  . GERD (gastroesophageal reflux disease) 07/04/2015  . Anemia 06/26/2015  .  Coronary artery disease involving autologous vein coronary bypass graft with unstable angina pectoris ([place]) 06/24/2015  . Angina at rest [facility])   . Subclavian artery stenosis, left ([place]) 06/23/2015  . AAA (abdominal aortic aneurysm) without rupture ([place]) 12/10/2014  . Subclavian steal syndrome 12/10/2014  . Renal artery stenosis ([place]) 09/04/2013  . Carotid artery stenosis 09/28/2012  . Mitral valve disorder 06/23/2011  . S/P CABG x 3 06/16/2011  . S/P Maze operation for atrial fibrillation 06/16/2011  . S/P MVR (mitral valve repair) 06/16/2011  . Follow-up examination following surgery 03/29/2011  . CKD (chronic kidney disease) 03/03/2011  . COPD (chronic obstructive pulmonary disease) ([place]) 03/03/2011  . Chronic diastolic heart failure ([place]) 02/26/2011  . Hypotension 02/26/2011  . NSTEMI (non-ST elevated myocardial infarction) ([place]) 02/26/2011  . Long term (current) use of anticoagulants 12/17/2010  . OBSTRUCTIVE SLEEP APNEA 09/04/2010  . HYPOXEMIA 09/04/2010  . SNORING 08/05/2010  . Atrial fibrillation ([place]) 12/09/2009  . WEIGHT LOSS 03/28/2009  . PALPITATIONS 03/28/2009  . PVD 11/21/2008  . HYPERLIPIDEMIA-MIXED 11/20/2008  . TOBACCO ABUSE 11/20/2008  . Essential hypertension 11/20/2008  . Coronary artery disease involving native heart with angina pectoris ([name]) 11/20/2008  . ABDOMINAL AORTIC ANEURYSM 11/20/2008   PCP:  Bernerd Limbo, MD Pharmacy:   [facility]- [place], [place] - [address]  [address] [place] [address] Phone: [phone] Fax: [phone]  [place], [place] [address] Phone: [phone] Fax: [place], [place] [address] Phone: [phone] Fax: [phone]     Social Determinants of Health (SDOH)  Interventions    Readmission Risk Interventions No flowsheet data found.

## 2019-07-30 NOTE — Progress Notes (Addendum)
Subjective:   Seen On Hd has continued Abd discomfort  with liquids , states "Using 16 gauge needles at [facility]  Always"   Objective Vital signs in last 24 hours: Vitals:   07/29/19 1632 07/29/19 2132 07/30/19 0056 07/30/19 0454  BP: (!) 160/51 (!) 158/57 (!) 166/57 (!) 166/67  Pulse: 67 66  (!) 59  Resp: 17 18  18   Temp: 99.3 F (37.4 C) 99.2 F (37.3 C)  98.1 F (36.7 C)  TempSrc: Oral Oral  Oral  SpO2: 99% 96%  100%  Weight:    45.8 kg  Height:       Weight change: -2.3 kg  Physical Exam General:alert on hd ,Thin female NAD Heart:RRR; 2/6 systolic murmur Lungs:CTA  BS +,:soft, mild generalized tenderness across upper abdomen Extremities:No LE edema Dialysis Access:L AVF patent on HD    Dialysis: TTS [facility] - last 11/19 completed 1/2 session  3.5h  400/800   48kg   3/2.25 bath  L AVF  Hep 2200 USE 16 G needles not 15ga - Hectoral 70mcg IV q HD - Venofer 50mg  IV weekly - Mircera 54mcg IV q 2 weeks (last 11/10)   Problem/Plan: 1. Acute on chronic pancreatitis: Lipase ^ 152 on admit this  am 62 , But continued Pain . Pain control per primary.Noted  has plannedoutpatientnerve block on 11/24 to address this issue - ? inpt  Plan per admit team  2. ESRD:Usual TTS sched - K 4.1  changing to holiday HD schedule - will be [facility] - next HD wed.   USE 16 g needles not 15ga  3. Hypertension/volume:Admit BP up with pulm edema on CT - slightly improved, below prior EDW.UF  today as bp tolerates  4. Anemia:Hgb 11.4  - hold  ESA with  hgb >11 5. Metabolic bone disease:Ca/Phos controlled. Resume binder Lorin Picket) once eating. Continue Hect. 6. Nutrition:Alb low - advance diet per primary. 7. T2DM:Per primary. 8. CAD (Hx CABG) 9. PAD 10. COPD   Ernest Haber, PA-C [facility] [phone] 07/30/2019,9:10 AM  LOS: 4 days   Pt seen, examined and agree w A/P as above.  Kelly Splinter  MD 07/30/2019, 1:09 PM    Labs: Basic Metabolic  Panel: Recent Labs  Lab 07/27/19 0802 07/28/19 0439 07/29/19 0511 07/30/19 0209  NA 136 135 139 137  K 4.4 3.7 3.8 4.1  CL 99 101 104 104  CO2 24 26 30 27   GLUCOSE 102* 85 79 120*  BUN 11 11 <5* 6*  CREATININE 1.99* 2.14* 1.30* 1.70*  CALCIUM 8.2* 7.9* 7.8* 7.8*  PHOS 4.4  --   --   --    Liver Function Tests: Recent Labs  Lab 07/26/19 1420  AST 18  ALT 11  ALKPHOS 80  BILITOT 0.8  PROT 5.6*  ALBUMIN 2.7*   Recent Labs  Lab 07/28/19 0439 07/29/19 0511 07/30/19 0209  LIPASE 71* 62* 60*   No results for input(s): AMMONIA in the last 168 hours. CBC: Recent Labs  Lab 07/27/19 0802 07/28/19 0439 07/29/19 0511 07/30/19 0209 07/30/19 0724  WBC 9.7 7.7 5.4 6.6 6.7  NEUTROABS 7.9* 6.3 3.8 4.7  --   HGB 12.2 11.8* 10.2* 10.2* 11.4*  HCT 37.4 35.6* 32.7* 31.3* 35.8*  MCV 98.7 98.1 100.6* 99.4 101.1*  PLT 248 194 167 164 195   Cardiac Enzymes: No results for input(s): CKTOTAL, CKMB, CKMBINDEX, TROPONINI in the last 168 hours. CBG: Recent Labs  Lab 07/29/19 0547 07/29/19 1056 07/29/19 1607  07/29/19 2347 07/30/19 0508  GLUCAP 81 89 130* 117* 75    Studies/Results: No results found. Medications:  . allopurinol  100 mg Oral Daily  . amLODipine  5 mg Oral Daily  . atorvastatin  40 mg Oral Daily  . carvedilol  25 mg Oral BID WC  . Chlorhexidine Gluconate Cloth  6 each Topical Q0600  . doxercalciferol  1 mcg Intravenous Q T,Th,Sa-HD  . gabapentin  300 mg Oral QHS  . heparin  5,000 Units Subcutaneous Q8H  . hydrALAZINE  25 mg Oral Q8H  . HYDROmorphone      . lipase/protease/amylase  24,000 Units Oral TID AC  . nicotine  14 mg Transdermal Daily  . pantoprazole  40 mg Oral Daily  . traZODone  150 mg Oral QHS

## 2019-07-30 NOTE — Discharge Summary (Signed)
Physician Discharge Summary  [name] BWI:[id number] DOB: 09/17/1950 DOA: 07/26/2019  PCP: Bernerd Limbo, MD  Admit date: 07/26/2019 Discharge date: 07/30/2019  Admitted From: Home Discharge disposition: Home   Code Status: Full Code  Diet Recommendation: Clear liquid diet   Recommendations for Outpatient Follow-Up:   1. Follow-up with pain management specialist 2. Follow-up PCP  Discharge Diagnosis:   Principal Problem:   Acute pancreatitis Active Problems:   Essential hypertension   Atrial fibrillation (HCC)   COPD (chronic obstructive pulmonary disease) (HCC)   Coronary artery disease involving autologous vein coronary bypass graft with unstable angina pectoris (HCC)   Recurrent pancreatitis   ESRD (end stage renal disease) ([facility])  History of Present Illness / Brief narrative:  [name] a 68 y.o.femalewithhistory of chronic pancreatitis of unknown cause, ESRD on HD on TTS, CAD s/p CABG, abdominal aortic aneurysm, COPD DM2.  She presented to the ED on 11/19 with progressively worsening abdominal pain for 4 days associated with multiple episodes of vomiting, one episode of diarrhea.  In the ED, patient required multiple doses of Dilaudid for pain relief. Lipase was elevated 252, Liver enzymes normal. WBC count 11.7. CT abdomen and pelvis showed groove pancreatitis in a background of pancreatic calcification from remote/chronic pancreatitis. Patient was admitted to hospitalist medicine service for further evaluation and management.  Hospital Course:  Acute on chronic pancreatitis -Presented with abdominal pain, vomiting. -Admitted with acute flareup of pancreatitis. -Unable to start on aggressive hydration because of dialysis status. -Continued pain control, bowel rest and frequent reassessment. -Lipase level improved, patient is on clear liquid diet.  She could not tolerate more advanced to full liquid diet yesterday.   -Patient states that she is  scheduled to have a nerve block tomorrow Tuesday 11/24.  She does not want to miss that appointment.  Patient wants to be discharged to home today.  She states that she checked with her pain management doctor who stated that she can get the nerve block while having acute pancreatitis. -Patient wants to be discharged home today. -Patient is at high risk of readmission from worsening of pancreatitis.  I have advised her to stay only on clear liquid diet at home for next several days and gradually advance diet as tolerated.  ESRD on HD-TTS Essential hypertension -Home meds include amlodipine, hydralazine, beta-blocker. -Continue all.  Blood pressure seems to be on the higher side probably because of pain. -Continue dialysis TTS.  CAD status post CABG -Currently not having chest pain. -Continue beta-blocker, statin.  History of A. Fib -Status post Maze procedure.  Not on anticoagulation.  COPD -stable.  History of abdominal aortic aneurysm -Status post stenting.  Stable for discharge to home today.  Subjective:  Seen and examined this morning in dialysis.  Pain controlled with IV Dilaudid.  Discharge Exam:   Vitals:   07/29/19 2132 07/30/19 0056 07/30/19 0454 07/30/19 1209  BP: (!) 158/57 (!) 166/57 (!) 166/67 (!) 163/57  Pulse: 66  (!) 59 69  Resp: 18  18 16   Temp: 99.2 F (37.3 C)  98.1 F (36.7 C) 98.9 F (37.2 C)  TempSrc: Oral  Oral Oral  SpO2: 96%  100% 97%  Weight:   45.8 kg   Height:        Body mass index is 16.8 kg/m.  General exam: Appears calm and comfortable while under the influence of pain medicine Skin: No rashes, lesions or ulcers. HEENT: Atraumatic, normocephalic, supple neck, no obvious bleeding Lungs: Clear to  auscultation bilaterally CVS: Regular rate and rhythm, no murmur GI/Abd soft, epigastric tenderness present, bowel sound present CNS: Alert, awake, oriented x3 Psychiatry: Depressed affect Extremities: No pedal edema, no calf tenderness   Discharge Instructions:  Wound care: None Discharge Instructions    Increase activity slowly   Complete by: As directed       Allergies as of 07/30/2019      Reactions   Isosorbide Other (See Comments)   Can only tolerate in low doses (unknown reaction) Other reaction(s): Other Unknown "FELT LIKE I WAS GOING TO EXPLODE FROM INSIDE OUT"   Isosorbide Nitrate Other (See Comments)   Can only tolerate in low doses (unknown reaction)   Oxycodone-acetaminophen Itching   Tolerates Hydrocodone.  Also tolerated oxycodone 07/13/18 admission.     Codeine Itching   Contrast Media [iodinated Diagnostic Agents] Itching, Other (See Comments)   Itching of feet   Ioxaglate Itching, Other (See Comments)   Itching of feet   Metrizamide Itching, Other (See Comments)   Itching of feet   Percocet [oxycodone-acetaminophen] Itching, Other (See Comments)   Tolerates Hydrocodone      Medication List    TAKE these medications   allopurinol 100 MG tablet Commonly known as: ZYLOPRIM Take 100 mg by mouth daily.   amLODipine 5 MG tablet Commonly known as: NORVASC Take 1 tablet (5 mg total) by mouth daily.   atorvastatin 40 MG tablet Commonly known as: LIPITOR Take 40 mg by mouth daily.   calcium carbonate 750 MG chewable tablet Commonly known as: TUMS EX Chew 2 tablets by mouth 3 (three) times daily as needed for heartburn.   carvedilol 25 MG tablet Commonly known as: COREG Take 25 mg by mouth 2 (two) times daily.   Creon 12000 units Cpep capsule Generic drug: lipase/protease/amylase Take 2 capsules (24,000 Units total) by mouth 3 (three) times daily before meals.   diphenhydrAMINE 25 MG tablet Commonly known as: BENADRYL Take 50 mg by mouth at bedtime.   feeding supplement (PRO-STAT SUGAR FREE 64) Liqd Take 30 mLs by mouth 3 (three) times daily with meals.   feeding supplement Liqd Commonly known as: BOOST / RESOURCE BREEZE Take 1 Container by mouth 2 (two) times daily.    gabapentin 300 MG capsule Commonly known as: NEURONTIN Take 1 capsule (300 mg total) by mouth at bedtime.   hydrALAZINE 25 MG tablet Commonly known as: APRESOLINE Take 1 tablet (25 mg total) by mouth every 8 (eight) hours.   HYDROmorphone 2 MG tablet Commonly known as: DILAUDID Take 2 mg by mouth every 4 (four) hours.   OXYGEN Inhale into the lungs at bedtime.   pantoprazole 40 MG tablet Commonly known as: PROTONIX Take 1 tablet (40 mg total) by mouth daily.   polyethylene glycol 17 g packet Commonly known as: MIRALAX / GLYCOLAX Take 17 g by mouth daily as needed for mild constipation.   ProAir RespiClick 300 (90 Base) MCG/ACT Aepb Generic drug: Albuterol Sulfate Inhale 1 puff into the lungs every 4 (four) hours as needed (shortness of breath/wheezing).   promethazine 12.5 MG tablet Commonly known as: PHENERGAN Take 12.5 mg by mouth every 4 (four) hours as needed for nausea or vomiting.   traZODone 100 MG tablet Commonly known as: DESYREL Take 150 mg by mouth at bedtime.       Time coordinating discharge: 35 minutes  The results of significant diagnostics from this hospitalization (including imaging, microbiology, ancillary and laboratory) are listed below for reference.    Procedures and  Diagnostic Studies:   Ct Abdomen Pelvis Wo Contrast  Result Date: 07/27/2019 CLINICAL DATA:  Nausea and vomiting.  Mid abdominal pain. EXAM: CT ABDOMEN AND PELVIS WITHOUT CONTRAST TECHNIQUE: Multidetector CT imaging of the abdomen and pelvis was performed following the standard protocol without IV contrast. COMPARISON:  05/12/2019 FINDINGS: Lower chest: Normal heart size. Coronary calcification that is extensive. Mild atelectasis in the lower lungs. Septal thickening and trace pleural effusions in the lower lungs. Hepatobiliary: No focal liver abnormality.Cholecystectomy. No bile duct dilatation or visible calcified cholelithiasis. Pancreas: Fullness of the pancreatic head with  indistinct margins and coarse calcifications. There is contiguous inflammation of the mid duodenum with submucosal low-density thickening of folds. No gastric outlet obstruction or evident perforation. Spleen: Unremarkable. Adrenals/Urinary Tract: Negative adrenals. Multiple hilar calcifications from some combination of urinary calculi and atherosclerosis. No hydronephrosis. Unremarkable bladder. Stomach/Bowel: No obstruction. No evidence of bowel inflammation outside of the duodenal findings noted above. Postoperative sigmoid colon. Appendectomy. Vascular/Lymphatic: No acute vascular abnormality. Aortic iliac stent grafting for aneurysm. There is been bilateral renal artery stenting. 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 Heavily calcified and collapsed appearing left SFA and right SFA. No mass or adenopathy. Reproductive:Hysterectomy Other: No ascites or pneumoperitoneum. Musculoskeletal: No acute abnormalities. IMPRESSION: 1. Groove pancreatitis. Inflammation is greater than seen on CT in September 2020. There is a background of pancreatic calcification from remote/chronic pancreatitis. 2. Interstitial pulmonary edema and trace pleural effusions. Severe atherosclerosis with aortic aneurysm stent grafting. Electronically Signed   By: Monte Fantasia M.D.   On: 07/27/2019 07:19   Dg Chest 2 View  Result Date: 07/26/2019 CLINICAL DATA:  Possible pneumothorax EXAM: CHEST - 2 VIEW COMPARISON:  07/26/2019, 08/14/2018 FINDINGS: Post sternotomy changes and atrial appendage clip. Stents arising from the aortic arch and at the left axillary region. Mildly coarse chronic interstitial opacity. No consolidation or pleural effusion. Aortic atherosclerosis. No definitive pneumothorax is seen. IMPRESSION: 1. No definitive pneumothorax identified 2. No acute airspace disease. Electronically Signed   By: Donavan Foil M.D.   On: 07/26/2019 19:03   Dg Abd 1 View  Result Date:  07/26/2019 CLINICAL DATA:  Nausea and vomiting. History of pancreatitis. EXAM: ABDOMEN - 1 VIEW COMPARISON:  Most recent CT 05/12/2019 FINDINGS: No evidence of free air. No bowel dilatation to suggest obstruction. Aorto bi-iliac stent graft in place. Cholecystectomy clips in the right upper quadrant. Right upper quadrant calcifications correspond to pancreatic calcifications as well as vascular calcifications on CT. Chain suture noted in the pelvis. Surgical clips in the right inguinal region. Aortic and branch atherosclerosis. Bones are under mineralized. No acute osseous abnormality. IMPRESSION: 1. Normal bowel gas pattern. No evidence of free air. 2. Right upper quadrant calcifications correspond to pancreatic calcifications on CT. Electronically Signed   By: Keith Rake M.D.   On: 07/26/2019 23:48   Dg Chest Port 1 View  Result Date: 07/26/2019 CLINICAL DATA:  RIGHT upper quadrant epigastric pain for 5 days, history CABG and MVR, hypertension, diabetes mellitus, CHF, COPD, pancreatitis EXAM: PORTABLE CHEST 1 VIEW COMPARISON:  Portable exam 1737 hours compared to 08/14/2018 FINDINGS: Normal heart size post CABG, MVR, and LEFT atrial appendage clipping. Mediastinal contours and pulmonary vascularity normal. Vascular stents identified arising from aortic arch and at the LEFT axilla. Atherosclerotic calcification aorta. Lungs appear emphysematous but clear. Question skin fold versus less likely pneumothorax at lateral RIGHT upper chest. Remaining lungs clear. No pleural effusion or segmental consolidation. Bones demineralized. IMPRESSION: COPD changes with  questionable skin fold versus pneumothorax at the lateral upper RIGHT chest, favor skin fold but recommend upright PA chest radiograph to exclude pneumothorax. Findings called to Wyn Quaker PA on 07/26/2019 at 1752 hrs. Electronically Signed   By: Lavonia Dana M.D.   On: 07/26/2019 17:52     Labs:   Basic Metabolic Panel: Recent Labs  Lab  07/26/19 1420 07/27/19 0802 07/28/19 0439 07/29/19 0511 07/30/19 0209  NA 137 136 135 139 137  K 3.6 4.4 3.7 3.8 4.1  CL 99 99 101 104 104  CO2 24 24 26 30 27   GLUCOSE 119* 102* 85 79 120*  BUN 6* 11 11 <5* 6*  CREATININE 1.58* 1.99* 2.14* 1.30* 1.70*  CALCIUM 8.1* 8.2* 7.9* 7.8* 7.8*  MG  --  1.7  --   --   --   PHOS  --  4.4  --   --   --    GFR Estimated Creatinine Clearance: 23.5 mL/min (A) (by C-G formula based on SCr of 1.7 mg/dL (H)). Liver Function Tests: Recent Labs  Lab 07/26/19 1420  AST 18  ALT 11  ALKPHOS 80  BILITOT 0.8  PROT 5.6*  ALBUMIN 2.7*   Recent Labs  Lab 07/26/19 1420 07/27/19 0802 07/28/19 0439 07/29/19 0511 07/30/19 0209  LIPASE 152* 122* 71* 62* 60*   No results for input(s): AMMONIA in the last 168 hours. Coagulation profile No results for input(s): INR, PROTIME in the last 168 hours.  CBC: Recent Labs  Lab 07/27/19 0802 07/28/19 0439 07/29/19 0511 07/30/19 0209 07/30/19 0724  WBC 9.7 7.7 5.4 6.6 6.7  NEUTROABS 7.9* 6.3 3.8 4.7  --   HGB 12.2 11.8* 10.2* 10.2* 11.4*  HCT 37.4 35.6* 32.7* 31.3* 35.8*  MCV 98.7 98.1 100.6* 99.4 101.1*  PLT 248 194 167 164 195   Cardiac Enzymes: No results for input(s): CKTOTAL, CKMB, CKMBINDEX, TROPONINI in the last 168 hours. BNP: Invalid input(s): POCBNP CBG: Recent Labs  Lab 07/29/19 1056 07/29/19 1607 07/29/19 2347 07/30/19 0508 07/30/19 1134  GLUCAP 89 130* 117* 75 85   D-Dimer No results for input(s): DDIMER in the last 72 hours. Hgb A1c No results for input(s): HGBA1C in the last 72 hours. Lipid Profile No results for input(s): CHOL, HDL, LDLCALC, TRIG, CHOLHDL, LDLDIRECT in the last 72 hours. Thyroid function studies No results for input(s): TSH, T4TOTAL, T3FREE, THYROIDAB in the last 72 hours.  Invalid input(s): FREET3 Anemia work up No results for input(s): VITAMINB12, FOLATE, FERRITIN, TIBC, IRON, RETICCTPCT in the last 72 hours. Microbiology Recent Results (from  the past 240 hour(s))  SARS CORONAVIRUS 2 (TAT 6-24 HRS) Nasopharyngeal Nasopharyngeal Swab     Status: None   Collection Time: 07/26/19  6:00 PM   Specimen: Nasopharyngeal Swab  Result Value Ref Range Status   SARS Coronavirus 2 NEGATIVE NEGATIVE Final    Comment: (NOTE) SARS-CoV-2 target nucleic acids are NOT DETECTED. The SARS-CoV-2 RNA is generally detectable in upper and lower respiratory specimens during the acute phase of infection. Negative results do not preclude SARS-CoV-2 infection, do not rule out co-infections with other pathogens, and should not be used as the sole basis for treatment or other patient management decisions. Negative results must be combined with clinical observations, patient history, and epidemiological information. The expected result is Negative. Fact Sheet for Patients: SugarRoll.be Fact Sheet for Healthcare Providers: https://www.woods-mathews.com/ This test is not yet approved or cleared by the [place] FDA and  has been authorized for detection and/or diagnosis  of SARS-CoV-2 by FDA under an Emergency Use Authorization (EUA). This EUA will remain  in effect (meaning this test can be used) for the duration of the COVID-19 declaration under Section 56 4(b)(1) of the Act, 21 U.S.C. section 360bbb-3(b)(1), unless the authorization is terminated or revoked sooner. Performed at [facility], [place] [address] [place], [place] [address]     Please note: You were cared for by a hospitalist during your hospital stay. Once you are discharged, your primary care physician will handle any further medical issues. Please note that NO REFILLS for any discharge medications will be authorized once you are discharged, as it is imperative that you return to your primary care physician (or establish a relationship with a primary care physician if you do not have one) for your post hospital discharge needs so that they  can reassess your need for medications and monitor your lab values.  Signed: Terrilee Croak  [facility] 07/30/2019, 2:28 PM

## 2019-07-30 NOTE — Progress Notes (Signed)
RN gave pt discharge instructions and she stated understanding no changes to her medication regime. IV has been removed and belongings packed waiting for daughter to arrive.

## 2019-07-31 ENCOUNTER — Telehealth: Payer: Self-pay | Admitting: Nephrology

## 2019-07-31 NOTE — Telephone Encounter (Signed)
Transition of Care Contact from [facility]  Date of Discharge: 07/30/2019 Date of Contact: 07/31/2019 Method of contact: phone  Attempted to contact patient to discuss transition of care from inpatient admission due to acute on chronic pancreatitis. Patient did not answer the phone.  Message was left on patient's voicemail with instructions to call dialysis center if any questions or concerns and we will follow up with her soon.   Jen Mow, PA-C [facility] Pager: [phone]

## 2019-08-23 ENCOUNTER — Encounter: Payer: Self-pay | Admitting: Intensive Care

## 2019-08-23 ENCOUNTER — Emergency Department: Payer: Medicare Other

## 2019-08-23 ENCOUNTER — Emergency Department
Admission: EM | Admit: 2019-08-23 | Discharge: 2019-08-23 | Disposition: A | Discharge status: Home / Self Care | Payer: Medicare Other | Attending: Emergency Medicine | Admitting: Emergency Medicine

## 2019-08-23 ENCOUNTER — Other Ambulatory Visit: Payer: Self-pay

## 2019-08-23 DIAGNOSIS — I480 Paroxysmal atrial fibrillation: Secondary | ICD-10-CM | POA: Diagnosis not present

## 2019-08-23 DIAGNOSIS — F17210 Nicotine dependence, cigarettes, uncomplicated: Secondary | ICD-10-CM | POA: Insufficient documentation

## 2019-08-23 DIAGNOSIS — R112 Nausea with vomiting, unspecified: Secondary | ICD-10-CM | POA: Diagnosis not present

## 2019-08-23 DIAGNOSIS — I259 Chronic ischemic heart disease, unspecified: Secondary | ICD-10-CM | POA: Insufficient documentation

## 2019-08-23 DIAGNOSIS — Z992 Dependence on renal dialysis: Secondary | ICD-10-CM | POA: Insufficient documentation

## 2019-08-23 DIAGNOSIS — Z951 Presence of aortocoronary bypass graft: Secondary | ICD-10-CM | POA: Insufficient documentation

## 2019-08-23 DIAGNOSIS — Z955 Presence of coronary angioplasty implant and graft: Secondary | ICD-10-CM | POA: Insufficient documentation

## 2019-08-23 DIAGNOSIS — N186 End stage renal disease: Secondary | ICD-10-CM | POA: Diagnosis not present

## 2019-08-23 DIAGNOSIS — Z7901 Long term (current) use of anticoagulants: Secondary | ICD-10-CM | POA: Diagnosis not present

## 2019-08-23 DIAGNOSIS — Z79899 Other long term (current) drug therapy: Secondary | ICD-10-CM | POA: Diagnosis not present

## 2019-08-23 DIAGNOSIS — E1122 Type 2 diabetes mellitus with diabetic chronic kidney disease: Secondary | ICD-10-CM | POA: Diagnosis not present

## 2019-08-23 DIAGNOSIS — I132 Hypertensive heart and chronic kidney disease with heart failure and with stage 5 chronic kidney disease, or end stage renal disease: Secondary | ICD-10-CM | POA: Insufficient documentation

## 2019-08-23 DIAGNOSIS — I5032 Chronic diastolic (congestive) heart failure: Secondary | ICD-10-CM | POA: Insufficient documentation

## 2019-08-23 DIAGNOSIS — J449 Chronic obstructive pulmonary disease, unspecified: Secondary | ICD-10-CM | POA: Diagnosis not present

## 2019-08-23 DIAGNOSIS — K859 Acute pancreatitis without necrosis or infection, unspecified: Secondary | ICD-10-CM | POA: Diagnosis not present

## 2019-08-23 DIAGNOSIS — R1010 Upper abdominal pain, unspecified: Secondary | ICD-10-CM | POA: Diagnosis present

## 2019-08-23 LAB — CBC
HCT: 37.6 % (ref 36.0–46.0)
Hemoglobin: 12.4 g/dL (ref 12.0–15.0)
MCH: 31.7 pg (ref 26.0–34.0)
MCHC: 33 g/dL (ref 30.0–36.0)
MCV: 96.2 fL (ref 80.0–100.0)
Platelets: 272 10*3/uL (ref 150–400)
RBC: 3.91 MIL/uL (ref 3.87–5.11)
RDW: 16.1 % — ABNORMAL HIGH (ref 11.5–15.5)
WBC: 9 10*3/uL (ref 4.0–10.5)
nRBC: 0 % (ref 0.0–0.2)

## 2019-08-23 LAB — COMPREHENSIVE METABOLIC PANEL
ALT: 9 U/L (ref 0–44)
AST: 18 U/L (ref 15–41)
Albumin: 2.8 g/dL — ABNORMAL LOW (ref 3.5–5.0)
Alkaline Phosphatase: 112 U/L (ref 38–126)
Anion gap: 14 (ref 5–15)
BUN: 34 mg/dL — ABNORMAL HIGH (ref 8–23)
CO2: 21 mmol/L — ABNORMAL LOW (ref 22–32)
Calcium: 8.2 mg/dL — ABNORMAL LOW (ref 8.9–10.3)
Chloride: 99 mmol/L (ref 98–111)
Creatinine, Ser: 3.08 mg/dL — ABNORMAL HIGH (ref 0.44–1.00)
GFR calc Af Amer: 17 mL/min — ABNORMAL LOW (ref >60)
GFR calc non Af Amer: 15 mL/min — ABNORMAL LOW (ref >60)
Glucose, Bld: 141 mg/dL — ABNORMAL HIGH (ref 70–99)
Potassium: 4.1 mmol/L (ref 3.5–5.1)
Sodium: 134 mmol/L — ABNORMAL LOW (ref 135–145)
Total Bilirubin: 0.9 mg/dL (ref 0.3–1.2)
Total Protein: 6 g/dL — ABNORMAL LOW (ref 6.5–8.1)

## 2019-08-23 LAB — LIPASE, BLOOD: Lipase: 53 U/L — ABNORMAL HIGH (ref 11–51)

## 2019-08-23 MED ORDER — PROMETHAZINE HCL 25 MG/ML IJ SOLN
12.5000 mg | Freq: Once | INTRAMUSCULAR | Status: AC
  Administered 2019-08-23: 12.5 mg via INTRAVENOUS
  Filled 2019-08-23: qty 1

## 2019-08-23 MED ORDER — SODIUM CHLORIDE 0.9 % IV BOLUS
500.0000 mL | Freq: Once | INTRAVENOUS | Status: AC
  Administered 2019-08-23: 500 mL via INTRAVENOUS

## 2019-08-23 MED ORDER — SODIUM CHLORIDE 0.9 % IV BOLUS: 500.0000 mL | Freq: Once | INTRAVENOUS | Status: DC

## 2019-08-23 MED ORDER — HYDROMORPHONE HCL 1 MG/ML IJ SOLN
1.5000 mg | Freq: Once | INTRAMUSCULAR | Status: AC
  Administered 2019-08-23: 1.5 mg via INTRAVENOUS
  Filled 2019-08-23: qty 2

## 2019-08-23 MED ORDER — HYDROMORPHONE HCL 1 MG/ML IJ SOLN
1.0000 mg | INTRAMUSCULAR | Status: DC | PRN
  Administered 2019-08-23: 1 mg via INTRAVENOUS
  Filled 2019-08-23: qty 1

## 2019-08-23 NOTE — ED Triage Notes (Addendum)
Patient c/o chronic pancreatitis and believes she is having an attack. C/o upper abd pain with vomiting X4 days. Dialysis patient T,Th,Sat. Reports she did not go this past Tuesday because she felt so bad. Fistula present on left arm. Patient went to dialysis today but they sent her here without treatment

## 2019-08-23 NOTE — Discharge Instructions (Signed)
You came in for your chronic pancreatitis.  We offered admission versus going home.  You preferred to go home at this time.  You return to the ER if you develop worsening symptoms or any other concerns.  You try get dialysis tomorrow but you should be okay if you wait until Saturday.

## 2019-08-23 NOTE — ED Notes (Signed)
Pt given chicken broth per MD Jari Pigg verbal order. Pt tolerating clear liquids well at this time. MD [facility] aware. Pt denies N/V at this time.

## 2019-08-23 NOTE — ED Provider Notes (Signed)
[facility] Emergency Department Provider Note  ____________________________________________   First MD Initiated Contact with Patient 08/23/19 1620     (approximate)  I have reviewed the triage vital signs and the nursing notes.   HISTORY  Chief Complaint Abdominal Pain and Pancreatitis (chronic)    HPI [name] is a 68 y.o. female prior abdominal aortic aneurysm repair, ESRD, Tuesdays Thursdays Saturdays, chronic pancreatitis who comes in for abdominal pain.  Patient states she is having upper abdominal pain that is consistent with her normal flareups.  She denies this being different at all from her prior flareups.  She states that it has been going on for for 5 days.  The pain is severe, constant, not better with home Dilaudid, worse after eating.  She has been having associated nausea and vomiting is nonbloody nonbilious.  No blood in her stool.  She states that she is not been to eat anything for 5 days.  She missed dialysis on Tuesday due to her symptoms.  She tried to get dialysis today but they told her that she needed to come to the ER given how bad she looked.          Past Medical History:  Diagnosis Date  . AAA (abdominal aortic aneurysm) ([place]) 5/09   3.7x3.3 by u/s 2009  . Abnormal EKG    deep TW inversions chronic  . Anemia   . CAD (coronary artery disease)    a. CABG 03/2011 LIMA to LAD, SVG to OM, SVG to PDA. b. cath 06/25/2015 DES to SVG to rPDA, patent LIMA to LAD, patent SVG to OM  . carotid stenosis 5/09   S/p L CEA ;  60-79% bilat ICA by preCABG dopplers 7/12  . CHF (congestive heart failure) ([place])   . Chronic back pain    "all over" (06/24/2015)  . Chronic bronchitis ([place])    "get it pretty much q yr" (06/24/2015)  . CKD (chronic kidney disease)   . Complication of anesthesia 1966   "problem w/ether"  . COPD (chronic obstructive pulmonary disease) ([place])   . Depression   . GERD (gastroesophageal reflux disease)    With  hiatal hernia  . GIB (gastrointestinal bleeding) 9/11   S/p EGD with cautery at [facility]  . Heart murmur   . History of blood transfusion "a few times"   "all related to anemia" (06/24/2015)  . History of hiatal hernia   . Hyperlipidemia   . Hypertension   . Mitral regurgitation    3+ MR by intraoperative TEE;  s/p MV repair with Dr. Roxy Manns 7/12  . Myocardial infarction [facility]) 2012 "several"  . On home oxygen therapy    "2 liters at night; negative for sleep apnea"  . PAD (peripheral artery disease) (HCC)    Severe; s/p bilateral renal artery stents, moderate in-stent restenosis  . Pancreatitis 05/2018  . Paroxysmal atrial fibrillation (HCC)    coumadin;  echo 9/07: EF 60%, mild LVH;  s/p Cox Maze 7/12 with LAA clipping  . Subclavian artery stenosis, left (HCC)    stented by Dr. Trula Slade on 10/18 to help flow of her LIMA to LAD  . Type II diabetes mellitus [facility])     Patient Active Problem List   Diagnosis Date Noted  . Acute pancreatitis 07/26/2019  . Acute on chronic pancreatitis ([place]) 05/12/2019  . Abnormal CT of the abdomen 09/25/2018  . Pancreatic divisum 09/25/2018  . Pancreatic mass   . ESRD (end stage renal disease) (  [place]) 09/09/2018  . Renal failure (ARF), acute on chronic (HCC) 08/15/2018  . ARF (acute renal failure) ([place]) 08/15/2018  . CKD (chronic kidney disease), stage V ([place]) 08/15/2018  . Hypomagnesemia 08/15/2018  . Acute pulmonary edema ([place]) 08/14/2018  . Goals of care, counseling/discussion   . DNR (do not resuscitate) discussion   . AKI (acute kidney injury) ([place]) 07/26/2018  . Acute encephalopathy 07/24/2018  . Chronic pain 07/24/2018  . Malnutrition of moderate degree 07/13/2018  . Stage II pressure ulcer ([place]) 07/12/2018  . Fluid collection at surgical site   . Intraabdominal fluid collection   . Cystic duct calculus   . RUQ pain 07/08/2018  . Peripancreatic fluid collection 07/04/2018  . 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 Urinary retention 07/04/2018    . S/P insertion of iliac artery stent 07/04/2018  . Endoleak post (EVAR) endovascular aneurysm repair ([place]) 07/04/2018  . Physical deconditioning   . Esophageal ulcer with bleeding   . Pressure injury of skin 06/16/2018  . Hematemesis   . Palliative care patient   . Palliative care by specialist   . Anasarca 06/07/2018  . Demand ischemia ([place]) 06/07/2018  . Protein-calorie malnutrition, severe 05/29/2018  . Type II diabetes mellitus with renal manifestations ([place]) 05/28/2018  . Recurrent pancreatitis 05/28/2018  . Hypokalemia 05/28/2018  . SIRS (systemic inflammatory response syndrome) ([place]) 05/28/2018  . Acute recurrent pancreatitis 05/28/2018  . Volume overload 04/17/2018  . Anemia due to GI blood loss 04/14/2018  . Acute gallstone pancreatitis 04/14/2018  . Acute cholecystitis 04/14/2018  . Gastrointestinal hemorrhage   . Fatigue associated with anemia 04/09/2018  . Hypoglycemia 01/19/2018  . Type II diabetes mellitus ([place]) 01/19/2018  . Nausea with vomiting 01/19/2018  . Uncontrolled hypertension 01/19/2018  . Hyperlipidemia 01/19/2018  . CAD (coronary artery disease) 01/19/2018  . COPD with hypoxia ([place]) 01/19/2018  . Chronic kidney disease (CKD), stage IV (severe) ([place]) 01/19/2018  . Retroperitoneal hematoma 01/19/2018  . Acute blood loss anemia 01/19/2018  . Chronic diastolic congestive heart failure ([place]) 01/19/2018  . AAA (abdominal aortic aneurysm) ([place]) 01/16/2018  . Elevated brain natriuretic peptide (BNP) level 10/07/2015  . Diabetes mellitus ([place]) 10/07/2015  . Pain in the chest   . PAD (peripheral artery disease) ([place]) 07/29/2015  . Chest pain 07/04/2015  . Acute renal failure superimposed on stage 4 chronic kidney disease ([place]) 07/04/2015  . GERD (gastroesophageal reflux disease) 07/04/2015  . Anemia 06/26/2015  . Coronary artery disease involving autologous vein coronary bypass graft with unstable angina pectoris ([place]) 06/24/2015  . Angina at rest [facility])    . Subclavian artery stenosis, left ([place]) 06/23/2015  . AAA (abdominal aortic aneurysm) without rupture ([place]) 12/10/2014  . Subclavian steal syndrome 12/10/2014  . Renal artery stenosis ([place]) 09/04/2013  . Carotid artery stenosis 09/28/2012  . Mitral valve disorder 06/23/2011  . S/P CABG x 3 06/16/2011  . S/P Maze operation for atrial fibrillation 06/16/2011  . S/P MVR (mitral valve repair) 06/16/2011  . Follow-up examination following surgery 03/29/2011  . CKD (chronic kidney disease) 03/03/2011  . COPD (chronic obstructive pulmonary disease) ([place]) 03/03/2011  . Chronic diastolic heart failure ([place]) 02/26/2011  . Hypotension 02/26/2011  . NSTEMI (non-ST elevated myocardial infarction) ([place]) 02/26/2011  . Long term (current) use of anticoagulants 12/17/2010  . OBSTRUCTIVE SLEEP APNEA 09/04/2010  . HYPOXEMIA 09/04/2010  . SNORING 08/05/2010  . Atrial fibrillation ([place]) 12/09/2009  . WEIGHT LOSS 03/28/2009  . PALPITATIONS 03/28/2009  . PVD 11/21/2008  . HYPERLIPIDEMIA-MIXED 11/20/2008  .  TOBACCO ABUSE 11/20/2008  . Essential hypertension 11/20/2008  . Coronary artery disease involving native heart with angina pectoris ([place]) 11/20/2008  . ABDOMINAL AORTIC ANEURYSM 11/20/2008    Past Surgical History:  Procedure Laterality Date  . A/V FISTULAGRAM Left 03/21/2018   Procedure: A/V FISTULAGRAM;  Surgeon: Serafina Mitchell, MD;  Location: [place] [facility];  Service: Cardiovascular;  Laterality: Left;  . ABDOMINAL AORTIC ENDOVASCULAR STENT GRAFT N/A 01/17/2018   Procedure: ABDOMINAL AORTIC ENDOVASCULAR STENT GRAFT WITH CO2;  Surgeon: Serafina Mitchell, MD;  Location: [place];  Service: Vascular;  Laterality: N/A;  . ABDOMINAL HYSTERECTOMY  1990's  . APPENDECTOMY  Aug. 11, 2016   Ruptured  . AV FISTULA PLACEMENT Left 11/03/2017   Procedure: ARTERIOVENOUS (AV) FISTULA CREATION LEFT ARM;  Surgeon: Serafina Mitchell, MD;  Location: [place];  Service: Vascular;  Laterality: Left;  . BOWEL  RESECTION  2013  . CARDIAC CATHETERIZATION N/A 06/24/2015   Procedure: Left Heart Cath and Cors/Grafts Angiography;  Surgeon: Leonie Man, MD;  Location: [place] [facility];  Service: Cardiovascular;  Laterality: N/A;  . CARDIAC CATHETERIZATION N/A 06/25/2015   Procedure: Coronary Stent Intervention;  Surgeon: Sherren Mocha, MD;  Location: [place] [facility];  Service: Cardiovascular;  Laterality: N/A;  . CAROTID ENDARTERECTOMY Left   . CATARACT EXTRACTION Right 03/27/2018  . CHOLECYSTECTOMY N/A 04/19/2018   Procedure: LAPAROSCOPIC CHOLECYSTECTOMY WITH INTRAOPERATIVE CHOLANGIOGRAM;  Surgeon: Rolm Bookbinder, MD;  Location: [place];  Service: General;  Laterality: N/A;  . CORONARY ANGIOPLASTY    . CORONARY ARTERY BYPASS GRAFT  03/05/2011   CABG X3 (LIMA to LAD, SVG to OM, SVG to PDA, EVH via left thigh  . CORONARY STENT PLACEMENT    . ESOPHAGOGASTRODUODENOSCOPY N/A 04/12/2018   Procedure: ESOPHAGOGASTRODUODENOSCOPY (EGD);  Surgeon: [name], MD;  [facility];  Service: Endoscopy;  Laterality: N/A;  . ESOPHAGOGASTRODUODENOSCOPY N/A 06/16/2018   Procedure: ESOPHAGOGASTRODUODENOSCOPY (EGD);  Surgeon: Thornton Park, MD;  Location: [place];  Service: Gastroenterology;  Laterality: N/A;  . LACERATION REPAIR Right 1990's   WRIST  . MAZE  03/05/2011   complete biatrial lesion set with clipping of LA appendage  . MITRAL VALVE REPAIR  03/05/2011   79mm Memo 3D ring annuloplasty for ischemic MR  . PERIPHERAL VASCULAR BALLOON ANGIOPLASTY Left 03/21/2018   Procedure: PERIPHERAL VASCULAR BALLOON ANGIOPLASTY;  Surgeon: Serafina Mitchell, MD;  Location: [place] [facility];  Service: Cardiovascular;  Laterality: Left;  . PERIPHERAL VASCULAR CATHETERIZATION N/A 06/24/2015   Procedure: Aortic Arch Angiography;  Surgeon: Serafina Mitchell, MD;  Location: [place] [facility];  Service: Cardiovascular;  Laterality: N/A;  . PERIPHERAL VASCULAR CATHETERIZATION  06/24/2015   Procedure:  Peripheral Vascular Intervention;  Surgeon: Serafina Mitchell, MD;  Location: [place] [facility];  Service: Cardiovascular;;  . PERIPHERAL VASCULAR CATHETERIZATION N/A 06/24/2015   Procedure: Abdominal Aortogram;  Surgeon: Serafina Mitchell, MD;  Location: [place] [facility];  Service: Cardiovascular;  Laterality: N/A;  . RENAL ANGIOGRAM Bilateral 09/11/2013   Procedure: RENAL ANGIOGRAM;  Surgeon: Serafina Mitchell, MD;  Location: [facility] C[facility] LAB;  Service: Cardiovascular;  Laterality: Bilateral;  . RIGHT FEMORAL-POPLITEAL BYPASS      Prior to Admission medications   Medication Sig Start Date End Date Taking? Authorizing Provider  Albuterol Sulfate (PROAIR RESPICLICK) 956 (90 Base) MCG/ACT AEPB Inhale 1 puff into the lungs every 4 (four) hours as needed (shortness of breath/wheezing).    [provider]  allopurinol (ZYLOPRIM) 100 MG  tablet Take 100 mg by mouth daily. 02/02/17   [provider]  Amino Acids-Protein Hydrolys (FEEDING SUPPLEMENT, PRO-STAT SUGAR FREE 64,) LIQD Take 30 mLs by mouth 3 (three) times daily with meals. 06/26/18   Thurnell Lose, MD  amLODipine (NORVASC) 5 MG tablet Take 1 tablet (5 mg total) by mouth daily. 08/23/18   Mikhail, Velta Addison, DO  atorvastatin (LIPITOR) 40 MG tablet Take 40 mg by mouth daily.  12/07/17   [provider]  calcium carbonate (TUMS EX) 750 MG chewable tablet Chew 2 tablets by mouth 3 (three) times daily as needed for heartburn.     [provider]  carvedilol (COREG) 25 MG tablet Take 25 mg by mouth 2 (two) times daily. 04/16/19   [provider]  CREON 12000 units CPEP capsule Take 2 capsules (24,000 Units total) by mouth 3 (three) times daily before meals. 05/18/19   Regalado, Belkys A, MD  diphenhydrAMINE (BENADRYL) 25 MG tablet Take 50 mg by mouth at bedtime.    [provider]  feeding supplement (BOOST / RESOURCE BREEZE) LIQD Take 1 Container by mouth 2 (two) times daily.    [provider]    gabapentin (NEURONTIN) 300 MG capsule Take 1 capsule (300 mg total) by mouth at bedtime. 08/22/18   Mikhail, Velta Addison, DO  hydrALAZINE (APRESOLINE) 25 MG tablet Take 1 tablet (25 mg total) by mouth every 8 (eight) hours. 06/26/18   Thurnell Lose, MD  HYDROmorphone (DILAUDID) 2 MG tablet Take 2 mg by mouth every 4 (four) hours.     [provider]  OXYGEN Inhale into the lungs at bedtime.    [provider]  pantoprazole (PROTONIX) 40 MG tablet Take 1 tablet (40 mg total) by mouth daily. 08/03/18   Patrecia Pour, MD  polyethylene glycol (MIRALAX / Floria Raveling) packet Take 17 g by mouth daily as needed for mild constipation. 06/26/18   Thurnell Lose, MD  promethazine (PHENERGAN) 12.5 MG tablet Take 12.5 mg by mouth every 4 (four) hours as needed for nausea or vomiting.    [provider]  traZODone (DESYREL) 100 MG tablet Take 150 mg by mouth at bedtime.  04/19/19   [provider]    Allergies Isosorbide, Isosorbide nitrate, Oxycodone-acetaminophen, Codeine, Contrast media [iodinated diagnostic agents], Ioxaglate, Metrizamide, and Percocet [oxycodone-acetaminophen]  Family History  Problem Relation Age of Onset  . Emphysema Mother   . Heart disease Mother        before age 59  . Hypertension Mother   . Hyperlipidemia Mother   . Heart attack Mother   . AAA (abdominal aortic aneurysm) Mother        rupture  . Heart attack Father 62  . Emphysema Father   . Heart disease Father        before age 58  . Hyperlipidemia Father   . Hypertension Father   . Peripheral vascular disease Father   . Diabetes Brother   . Heart disease Brother        before age 40  . Hyperlipidemia Brother   . Hypertension Brother   . Heart attack Brother        CABG  . Heart disease Other        Vascular disease in grandparents, uncles and dad  . Colon cancer Neg Hx   . Esophageal cancer Neg Hx   . Inflammatory bowel disease Neg Hx   . Liver disease Neg Hx   .  Pancreatic cancer Neg Hx   .  Rectal cancer Neg Hx   . Stomach cancer Neg Hx     Social History Social History   Tobacco Use  . Smoking status: Current Every Day Smoker    Years: 50.00    Types: Cigarettes  . Smokeless tobacco: Never Used  . Tobacco comment: 3/4 pk per day  Substance Use Topics  . Alcohol use: Yes    Alcohol/week: 0.0 standard drinks    Comment: occasionally  . Drug use: Not Currently    Types: Marijuana      Review of Systems Constitutional: No fever/chills Eyes: No visual changes. ENT: No sore throat. Cardiovascular: Denies chest pain. Respiratory: Denies shortness of breath. Gastrointestinal: Positive abdominal pain, nausea, vomiting Genitourinary: Negative for dysuria. Musculoskeletal: Negative for back pain. Skin: Negative for rash. Neurological: Negative for headaches, focal weakness or numbness. All other ROS negative ____________________________________________   PHYSICAL EXAM:  VITAL SIGNS: ED Triage Vitals  Enc Vitals Group     BP 08/23/19 1232 (!) 108/57     Pulse Rate 08/23/19 1232 71     Resp 08/23/19 1232 16     Temp 08/23/19 1232 98.8 F (37.1 C)     Temp Source 08/23/19 1232 Oral     SpO2 08/23/19 1232 97 %     Weight 08/23/19 1233 99 lb (44.9 kg)     Height 08/23/19 1233 5\' 5"  (1.651 m)     Head Circumference --      Peak Flow --      Pain Score 08/23/19 1233 8     Pain Loc --      Pain Edu? --      Excl. in [place]? --     Constitutional: Alert and oriented. Well appearing and in no acute distress. Eyes: Conjunctivae are normal. EOMI. Head: Atraumatic. Nose: No congestion/rhinnorhea. Mouth/Throat: Mucous membranes are moist.   Neck: No stridor. Trachea Midline. FROM Cardiovascular: Normal rate, regular rhythm. Grossly normal heart sounds.  Good peripheral circulation. Respiratory: Normal respiratory effort.  No retractions. Lungs CTAB. Gastrointestinal: Tender in the upper abdomen with no rebound or guarding.  No  distention. No abdominal bruits.  Musculoskeletal: No lower extremity tenderness nor edema.  No joint effusions. Neurologic:  Normal speech and language. No gross focal neurologic deficits are appreciated.  Skin:  Skin is warm, dry and intact. No rash noted. Psychiatric: Mood and affect are normal. Speech and behavior are normal. GU: Deferred   ____________________________________________   LABS (all labs ordered are listed, but only abnormal results are displayed)  Labs Reviewed  LIPASE, BLOOD - Abnormal; Notable for the following components:      Result Value   Lipase 53 (*)    All other components within normal limits  COMPREHENSIVE METABOLIC PANEL - Abnormal; Notable for the following components:   Sodium 134 (*)    CO2 21 (*)    Glucose, Bld 141 (*)    BUN 34 (*)    Creatinine, Ser 3.08 (*)    Calcium 8.2 (*)    Total Protein 6.0 (*)    Albumin 2.8 (*)    GFR calc non Af Amer 15 (*)    GFR calc Af Amer 17 (*)    All other components within normal limits  CBC - Abnormal; Notable for the following components:   RDW 16.1 (*)    All other components within normal limits  URINALYSIS, COMPLETE (UACMP) WITH MICROSCOPIC   ____________________________________________   ED ECG REPORT I, Vanessa A[place]bogue, the attending physician, personally  viewed and interpreted this ECG.  EKG is normal sinus rate of 74 with a right bundle branch block with T wave inversions that are similar to prior EKGs, no ST elevation ____________________________________________  RADIOLOGY I, Vanessa Graball, personally viewed and evaluated these images (plain radiographs) as part of my medical decision making, as well as reviewing the written report by the radiologist.  ED MD interpretation:  No pna  Official radiology report(s): DG Chest 1 View  Result Date: 08/23/2019 CLINICAL DATA:  Upper abdominal pain.  History of pancreatitis. EXAM: CHEST  1 VIEW COMPARISON:  Chest x-ray dated July 26, 2019.  FINDINGS: The heart size and mediastinal contours are within normal limits. Prior CABG, mitral valve annuloplasty, and left atrial appendage clipping. Atherosclerotic calcification of the aortic arch. Normal pulmonary vascularity. Chronically coarsened interstitial markings are similar to prior study. No focal consolidation, pleural effusion, or pneumothorax. No acute osseous abnormality. Unchanged proximal left subclavian artery stent and left axillary vein stent. IMPRESSION: Chronic and postsurgical changes.  No active disease. Electronically Signed   By: Titus Dubin M.D.   On: 08/23/2019 17:18    ____________________________________________   PROCEDURES  Procedure(s) performed (including Critical Care):  Procedures   ____________________________________________   INITIAL IMPRESSION / ASSESSMENT AND PLAN / ED COURSE  [name] was evaluated in Emergency Department on 08/23/2019 for the symptoms described in the history of present illness. She was evaluated in the context of the global COVID-19 pandemic, which necessitated consideration that the patient might be at risk for infection with the SARS-CoV-2 virus that causes COVID-19. Institutional protocols and algorithms that pertain to the evaluation of patients at risk for COVID-19 are in a state of rapid change based on information released by regulatory bodies including the CDC and federal and state organizations. These policies and algorithms were followed during the patient's care in the ED.    Patient is a 68 year old with chronic pancreatitis who comes in with recurrent symptoms and not tolerating p.o.  Patient unable to get dialysis due to how severe her symptoms have been.  Will get labs to evaluate for need for emergent dialysis such as acidosis, hyperkalemia, fluid overload.  Does not appear to be significantly fluid overloaded given her normal oxygen levels.  We discussed CT imaging but patient would like to try to hold off  given this feels exactly like her prior episodes of pancreatitis.  I discussed my concern that she had prior AAA repair and that could have rupture be mimicking her pancreatitis but she states that she has the symptoms every time when she has her pancreatitis.  She like to hold off on the CT imaging if possible.  She states that typically she does better with Dilaudid, fluids, Phenergan or Zofran.  She states that typically she is admitted for 4 or 5 days before she can tolerate p.o. she denies any pain in her chest and her EKG looks similar to prior with T wave inversions that lower suspicion for ACS.   Labs are reassuring.  Kidney function slightly elevated secondary to her being ESRD but no evidence of needing emergent dialysis given no pulmonary edema, BUN is not significantly elevated and her K is normal.  6:23 PM reevaluated patient.  Patient states she is feeling much better.  Patient would like to trial p.o.  I offered patient admission to the hospital for symptomatic management versus going home and coming back if her symptoms are worsening.  Patient would like to trial the p.o. and  be reevaluated again around 8:00 to decide.  9:54 PM reevaluated patient multiple times and she would prefer to go home at this time.  I had lengthy discussion with her and her daughter and offered her admission for symptomatic management but she states she is feeling much better and is tolerating p.o. and she would prefer to go home.  She understands that if her symptoms are worsening that she can return to the ER.  She will follow-up for her dialysis.  I discussed the provisional nature of ED diagnosis, the treatment so far, the ongoing plan of care, follow up appointments and return precautions with the patient and any family or support people present. They expressed understanding and agreed with the plan, discharged home.   ____________________________________________   FINAL CLINICAL IMPRESSION(S) / ED  DIAGNOSES   Final diagnoses:  Acute pancreatitis, unspecified complication status, unspecified pancreatitis type  Intractable vomiting with nausea, unspecified vomiting type      MEDICATIONS GIVEN DURING THIS VISIT:  Medications  HYDROmorphone (DILAUDID) injection 1 mg (has no administration in time range)  promethazine (PHENERGAN) injection 12.5 mg (has no administration in time range)  sodium chloride 0.9 % bolus 500 mL (has no administration in time range)     ED Discharge Orders    None       Note:  This document was prepared using Dragon voice recognition software and may include unintentional dictation errors.   Vanessa Hansford, MD 08/23/19 2156

## 2019-08-23 NOTE — ED Notes (Signed)
Pt able to tolerate chicken broth. MD [facility] aware.

## 2019-09-05 ENCOUNTER — Other Ambulatory Visit: Payer: Self-pay | Admitting: Anesthesiology

## 2019-09-05 DIAGNOSIS — K861 Other chronic pancreatitis: Secondary | ICD-10-CM

## 2019-09-13 ENCOUNTER — Other Ambulatory Visit: Payer: Self-pay | Admitting: Interventional Radiology

## 2019-09-13 ENCOUNTER — Other Ambulatory Visit (HOSPITAL_COMMUNITY): Payer: Self-pay | Admitting: Interventional Radiology

## 2019-09-13 ENCOUNTER — Encounter: Payer: Self-pay | Admitting: *Deleted

## 2019-09-13 ENCOUNTER — Ambulatory Visit
Admission: RE | Admit: 2019-09-13 | Discharge: 2019-09-13 | Disposition: A | Discharge status: Home / Self Care | Payer: Medicare Other | Source: Ambulatory Visit | Attending: Anesthesiology | Admitting: Anesthesiology

## 2019-09-13 ENCOUNTER — Other Ambulatory Visit: Payer: Self-pay

## 2019-09-13 DIAGNOSIS — K861 Other chronic pancreatitis: Secondary | ICD-10-CM

## 2019-09-13 HISTORY — PX: IR RADIOLOGIST EVAL & MGMT: IMG5224

## 2019-09-13 NOTE — Consult Note (Signed)
Chief Complaint: Patient was consulted remotely today (TeleHealth) for celiac plexus ablation at the request of [facility].    Referring Physician(s): O'Toole,David  History of Present Illness: [name] is a 69 y.o. female with a history of chronic pancreatitis since 2019.  She has episodes of severe abdominal pain which she describes as mostly right-sided and spreading under her ribs with radiation into her back.  This pain is described as sharp, burning and feeling "on fire".  She has associated nausea and vomiting.  She does take Dilaudid 2 mg orally up to every 4 hours for pain and doubles her dose when she has severe pain.  Her last episode of severe pain was approximately 2 to 3 weeks ago just before Christmas and she was evaluated in the Emergency Department at that time.  She has had difficulty maintaining her weight due to lack of appetite and difficulty eating when she has episodes of pain.  The patient was referred to Dr. Francesco Runner who performed a diagnostic celiac plexus anesthetic injection a couple months ago.  This did provide a day or two of pain relief which she describes as approximately 60-70% reduction in her baseline pain level.  Dr. Francesco Runner has referred her for a CT-guided more definitive celiac plexus ablation procedure for longer term benefit as it was difficult to localize the region of her celiac plexus by fluoroscopic landmarks due to her bone density.  The patient has a history of end-stage renal disease and is status post left brachiocephalic AV fistula placement by Dr. Trula Slade in 2019.  She is dialyzed on Tuesdays, Thursdays and Saturdays at the [facility] and primarily sees Dr. Posey Pronto of [facility].  She is status post EVAR to treat an abdominal aortic aneurysm by Dr. Trula Slade in May, 2019 and cholecystectomy in August, 2019 by Dr. Donne Hazel.  Past Medical History:  Diagnosis Date   AAA (abdominal aortic aneurysm) ([place]) 5/09   3.7x3.3 by  u/s 2009   Abnormal EKG    deep TW inversions chronic   Anemia    CAD (coronary artery disease)    a. CABG 03/2011 LIMA to LAD, SVG to OM, SVG to PDA. b. cath 06/25/2015 DES to SVG to rPDA, patent LIMA to LAD, patent SVG to OM   carotid stenosis 5/09   S/p L CEA ;  60-79% bilat ICA by preCABG dopplers 7/12   CHF (congestive heart failure) (HCC)    Chronic back pain    "all over" (06/24/2015)   Chronic bronchitis ([place])    "get it pretty much q yr" (06/24/2015)   CKD (chronic kidney disease)    Complication of anesthesia 1966   "problem w/ether"   COPD (chronic obstructive pulmonary disease) ([place])    Depression    GERD (gastroesophageal reflux disease)    With hiatal hernia   GIB (gastrointestinal bleeding) 9/11   S/p EGD with cautery at [facility]   Heart murmur    History of blood transfusion "a few times"   "all related to anemia" (06/24/2015)   History of hiatal hernia    Hyperlipidemia    Hypertension    Mitral regurgitation    3+ MR by intraoperative TEE;  s/p MV repair with Dr. Roxy Manns 7/12   Myocardial infarction [facility]) 2012 "several"   On home oxygen therapy    "2 liters at night; negative for sleep apnea"   PAD (peripheral artery disease) ([place])    Severe; s/p bilateral renal artery stents, moderate in-stent restenosis  Pancreatitis 05/2018   Paroxysmal atrial fibrillation (HCC)    coumadin;  echo 9/07: EF 60%, mild LVH;  s/p Cox Maze 7/12 with LAA clipping   Subclavian artery stenosis, left ([place])    stented by Dr. Trula Slade on 10/18 to help flow of her LIMA to LAD   Type II diabetes mellitus [facility])     Past Surgical History:  Procedure Laterality Date   A/V FISTULAGRAM Left 03/21/2018   Procedure: A/V FISTULAGRAM;  Surgeon: Serafina Mitchell, MD;  Location: [place] [facility];  Service: Cardiovascular;  Laterality: Left;   ABDOMINAL AORTIC ENDOVASCULAR STENT GRAFT N/A 01/17/2018   Procedure: ABDOMINAL AORTIC ENDOVASCULAR STENT GRAFT WITH CO2;   Surgeon: Serafina Mitchell, MD;  Location: [place];  Service: Vascular;  Laterality: N/A;   ABDOMINAL HYSTERECTOMY  1990's   APPENDECTOMY  Aug. 11, 2016   Ruptured   AV FISTULA PLACEMENT Left 11/03/2017   Procedure: ARTERIOVENOUS (AV) FISTULA CREATION LEFT ARM;  Surgeon: Serafina Mitchell, MD;  Location: [place];  Service: Vascular;  Laterality: Left;   BOWEL RESECTION  2013   CARDIAC CATHETERIZATION N/A 06/24/2015   Procedure: Left Heart Cath and Cors/Grafts Angiography;  Surgeon: Leonie Man, MD;  Location: [place] [facility];  Service: Cardiovascular;  Laterality: N/A;   CARDIAC CATHETERIZATION N/A 06/25/2015   Procedure: Coronary Stent Intervention;  Surgeon: Sherren Mocha, MD;  Location: [place] [facility];  Service: Cardiovascular;  Laterality: N/A;   CAROTID ENDARTERECTOMY Left    CATARACT EXTRACTION Right 03/27/2018   CHOLECYSTECTOMY N/A 04/19/2018   Procedure: LAPAROSCOPIC CHOLECYSTECTOMY WITH INTRAOPERATIVE CHOLANGIOGRAM;  Surgeon: Rolm Bookbinder, MD;  Location: [place];  Service: General;  Laterality: N/A;   CORONARY ANGIOPLASTY     CORONARY ARTERY BYPASS GRAFT  03/05/2011   CABG X3 (LIMA to LAD, SVG to OM, SVG to PDA, EVH via left thigh   CORONARY STENT PLACEMENT     ESOPHAGOGASTRODUODENOSCOPY N/A 04/12/2018   Procedure: ESOPHAGOGASTRODUODENOSCOPY (EGD);  Surgeon: [name], MD;  Location: [facility] ENDOSCOPY;  Service: Endoscopy;  Laterality: N/A;   ESOPHAGOGASTRODUODENOSCOPY N/A 06/16/2018   Procedure: ESOPHAGOGASTRODUODENOSCOPY (EGD);  Surgeon: Thornton Park, MD;  Location: [place];  Service: Gastroenterology;  Laterality: N/A;   LACERATION REPAIR Right 1990's   WRIST   MAZE  03/05/2011   complete biatrial lesion set with clipping of LA appendage   MITRAL VALVE REPAIR  03/05/2011   67mm Memo 3D ring annuloplasty for ischemic MR   PERIPHERAL VASCULAR BALLOON ANGIOPLASTY Left 03/21/2018   Procedure: PERIPHERAL VASCULAR BALLOON ANGIOPLASTY;  Surgeon:  Serafina Mitchell, MD;  Location: [place] [facility];  Service: Cardiovascular;  Laterality: Left;   PERIPHERAL VASCULAR CATHETERIZATION N/A 06/24/2015   Procedure: Aortic Arch Angiography;  Surgeon: Serafina Mitchell, MD;  Location: [place] [facility];  Service: Cardiovascular;  Laterality: N/A;   PERIPHERAL VASCULAR CATHETERIZATION  06/24/2015   Procedure: Peripheral Vascular Intervention;  Surgeon: Serafina Mitchell, MD;  Location: [place] [facility];  Service: Cardiovascular;;   PERIPHERAL VASCULAR CATHETERIZATION N/A 06/24/2015   Procedure: Abdominal Aortogram;  Surgeon: Serafina Mitchell, MD;  Location: [place] [facility];  Service: Cardiovascular;  Laterality: N/A;   RENAL ANGIOGRAM Bilateral 09/11/2013   Procedure: RENAL ANGIOGRAM;  Surgeon: Serafina Mitchell, MD;  Location: [facility] CATH LAB;  Service: Cardiovascular;  Laterality: Bilateral;   RIGHT FEMORAL-POPLITEAL BYPASS      Allergies: Isosorbide, Isosorbide nitrate, Oxycodone-acetaminophen, Codeine, Contrast media [iodinated diagnostic agents], Ioxaglate, Metrizamide, and Percocet [oxycodone-acetaminophen]  Medications: Prior  to Admission medications   Medication Sig Start Date End Date Taking? Authorizing Provider  Albuterol Sulfate (PROAIR RESPICLICK) 161 (90 Base) MCG/ACT AEPB Inhale 1 puff into the lungs every 4 (four) hours as needed (shortness of breath/wheezing).    [provider]  allopurinol (ZYLOPRIM) 100 MG tablet Take 100 mg by mouth daily. 02/02/17   [provider]  Amino Acids-Protein Hydrolys (FEEDING SUPPLEMENT, PRO-STAT SUGAR FREE 64,) LIQD Take 30 mLs by mouth 3 (three) times daily with meals. 06/26/18   Thurnell Lose, MD  amLODipine (NORVASC) 5 MG tablet Take 1 tablet (5 mg total) by mouth daily. 08/23/18   Mikhail, Velta Addison, DO  atorvastatin (LIPITOR) 40 MG tablet Take 40 mg by mouth daily.  12/07/17   [provider]  calcium carbonate (TUMS EX) 750 MG chewable tablet Chew 2 tablets by mouth  3 (three) times daily as needed for heartburn.     [provider]  carvedilol (COREG) 25 MG tablet Take 25 mg by mouth 2 (two) times daily. 04/16/19   [provider]  CREON 12000 units CPEP capsule Take 2 capsules (24,000 Units total) by mouth 3 (three) times daily before meals. 05/18/19   Regalado, Belkys A, MD  diphenhydrAMINE (BENADRYL) 25 MG tablet Take 50 mg by mouth at bedtime.    [provider]  feeding supplement (BOOST / RESOURCE BREEZE) LIQD Take 1 Container by mouth 2 (two) times daily.    [provider]  gabapentin (NEURONTIN) 300 MG capsule Take 1 capsule (300 mg total) by mouth at bedtime. 08/22/18   Mikhail, Velta Addison, DO  hydrALAZINE (APRESOLINE) 25 MG tablet Take 1 tablet (25 mg total) by mouth every 8 (eight) hours. 06/26/18   Thurnell Lose, MD  HYDROmorphone (DILAUDID) 2 MG tablet Take 2 mg by mouth every 4 (four) hours.     [provider]  OXYGEN Inhale into the lungs at bedtime.    [provider]  pantoprazole (PROTONIX) 40 MG tablet Take 1 tablet (40 mg total) by mouth daily. 08/03/18   Patrecia Pour, MD  polyethylene glycol (MIRALAX / Floria Raveling) packet Take 17 g by mouth daily as needed for mild constipation. 06/26/18   Thurnell Lose, MD  promethazine (PHENERGAN) 12.5 MG tablet Take 12.5 mg by mouth every 4 (four) hours as needed for nausea or vomiting.    [provider]  traZODone (DESYREL) 100 MG tablet Take 150 mg by mouth at bedtime.  04/19/19   [provider]     Family History  Problem Relation Age of Onset   Emphysema Mother    Heart disease Mother        before age 70   Hypertension Mother    Hyperlipidemia Mother    Heart attack Mother    AAA (abdominal aortic aneurysm) Mother        rupture   Heart attack Father 49   Emphysema Father    Heart disease Father        before age 49   Hyperlipidemia Father    Hypertension Father    Peripheral vascular disease Father     Diabetes Brother    Heart disease Brother        before age 2   Hyperlipidemia Brother    Hypertension Brother    Heart attack Brother        CABG   Heart disease Other        Vascular disease in grandparents, uncles and dad  Colon cancer Neg Hx    Esophageal cancer Neg Hx    Inflammatory bowel disease Neg Hx    Liver disease Neg Hx    Pancreatic cancer Neg Hx    Rectal cancer Neg Hx    Stomach cancer Neg Hx     Social History   Socioeconomic History   Marital status: Widowed    Spouse name: Not on file   Number of children: Not on file   Years of education: Not on file   Highest education level: Not on file  Occupational History   Occupation: Scientist, water quality in past    Employer: DISABLED  Tobacco Use   Smoking status: Current Every Day Smoker    Years: 50.00    Types: Cigarettes   Smokeless tobacco: Never Used   Tobacco comment: 3/4 pk per day  Substance and Sexual Activity   Alcohol use: Yes    Alcohol/week: 0.0 standard drinks    Comment: occasionally   Drug use: Not Currently    Types: Marijuana   Sexual activity: Not Currently    Birth control/protection: None  Other Topics Concern   Not on file  Social History Narrative   Widowed in 2009.   Social Determinants of Health   Financial Resource Strain:    Difficulty of Paying Living Expenses: Not on file  Food Insecurity:    Worried About Charity fundraiser in the Last Year: Not on file   [employer] of Food in the Last Year: Not on file  Transportation Needs:    Lack of Transportation (Medical): Not on file   Lack of Transportation (Non-Medical): Not on file  Physical Activity:    Days of Exercise per Week: Not on file   Minutes of Exercise per Session: Not on file  Stress:    Feeling of Stress : Not on file  Social Connections:    Frequency of Communication with Friends and Family: Not on file   Frequency of Social Gatherings with Friends and Family: Not on file    Attends Religious Services: Not on file   Active Member of Clubs or Organizations: Not on file   Attends Archivist Meetings: Not on file   Marital Status: Not on file    Review of Systems  Constitutional: Positive for appetite change and unexpected weight change.  Respiratory: Negative.   Cardiovascular: Negative.   Gastrointestinal: Positive for abdominal pain, nausea and vomiting.  Genitourinary: Negative.   Musculoskeletal: Negative.   Neurological: Negative.     Review of Systems: A 12 point ROS discussed and pertinent positives are indicated in the HPI above.  All other systems are negative.  Physical Exam No direct physical exam was performed (except for noted visual exam findings with Video Visits).   Vital Signs: There were no vitals taken for this visit.  Imaging: DG Chest 1 View  Result Date: 08/23/2019 CLINICAL DATA:  Upper abdominal pain.  History of pancreatitis. EXAM: CHEST  1 VIEW COMPARISON:  Chest x-ray dated July 26, 2019. FINDINGS: The heart size and mediastinal contours are within normal limits. Prior CABG, mitral valve annuloplasty, and left atrial appendage clipping. Atherosclerotic calcification of the aortic arch. Normal pulmonary vascularity. Chronically coarsened interstitial markings are similar to prior study. No focal consolidation, pleural effusion, or pneumothorax. No acute osseous abnormality. Unchanged proximal left subclavian artery stent and left axillary vein stent. IMPRESSION: Chronic and postsurgical changes.  No active disease. Electronically Signed   By: Titus Dubin M.D.   On:  08/23/2019 17:18    Labs:  CBC: Recent Labs    07/29/19 0511 07/30/19 0209 07/30/19 0724 08/23/19 1238  WBC 5.4 6.6 6.7 9.0  HGB 10.2* 10.2* 11.4* 12.4  HCT 32.7* 31.3* 35.8* 37.6  PLT 167 164 195 272    COAGS: No results for input(s): INR, APTT in the last 8760 hours.  BMP: Recent Labs    07/28/19 0439 07/29/19 0511  07/30/19 0209 08/23/19 1238  NA 135 139 137 134*  K 3.7 3.8 4.1 4.1  CL 101 104 104 99  CO2 26 30 27  21*  GLUCOSE 85 79 120* 141*  BUN 11 <5* 6* 34*  CALCIUM 7.9* 7.8* 7.8* 8.2*  CREATININE 2.14* 1.30* 1.70* 3.08*  GFRNONAA 23* 43* 31* 15*  GFRAA 27* 50* 36* 17*    LIVER FUNCTION TESTS: Recent Labs    05/16/19 0448 05/17/19 0810 07/12/19 1913 07/26/19 1420 08/23/19 1238  BILITOT 0.5  --  0.9 0.8 0.9  AST 14*  --  15 18 18   ALT 9  --  9 11 9   ALKPHOS 60  --  90 80 112  PROT 4.2*  --  5.3* 5.6* 6.0*  ALBUMIN 2.0* 2.2* 2.7* 2.7* 2.8*     Assessment and Plan:  I spoke with [name] by phone.  We discussed the potential benefits of proceeding with a more durable celiac plexus ablation procedure under CT guidance to treat her chronic intractable abdominal pain related to chronic pancreatitis.  It is a good sign that she did have some response to the diagnostic anesthetic injection performed by Dr. Francesco Runner.  Given difficulty with fluoroscopic localization, CT localization would be more accurate and safer.  I reviewed a CT of the abdomen performed on 07/27/2019 that shows a clear anterior transgastric window to the region of the celiac plexus.  I discussed a celiac plexus ablation procedure with [name] including the use of moderate conscious sedation, CT guidance and needle placement.  A combination of anesthetic, steroid and absolute alcohol would then be utilized.  I told [name] that response is variable, especially in the setting of chronic pancreatitis compared to pain due to cancer.  Given that she did have some improvement after the diagnostic anesthetic injection, she should have a higher chance of having at least a modest response to an ablative procedure with absolute alcohol.  The procedure can also be repeated after some interval of time if pain should return and the initial procedure was successful.  After discussion, [name] would like to schedule a CT-guided  celiac plexus block.  We will schedule the procedure at [facility] on an outpatient basis which will not require overnight admission.  Typically she would be observed for approximately 4 hours after the procedure for any signs of complication and to treat any pain exacerbation due to use of alcohol.  Thank you for this interesting consult.  I greatly enjoyed meeting [name] and look forward to participating in their care.  A copy of this report was sent to the requesting provider on this date.  Electronically Signed: Azzie Roup 09/13/2019, 9:02 AM     I spent a total of 30 Minutes in remote  clinical consultation, greater than 50% of which was counseling/coordinating care for celiac plexus ablation.    Visit type: Audio only (telephone). Audio (no video) only due to patient's lack of internet/smartphone capability. Alternative for in-person consultation at [facility] [address], [place], [place]. This visit  type was conducted due to national recommendations for restrictions regarding the COVID-19 Pandemic (e.g. social distancing).  This format is felt to be most appropriate for this patient at this time.  All issues noted in this document were discussed and addressed.

## 2019-09-21 ENCOUNTER — Other Ambulatory Visit: Payer: Self-pay | Admitting: Physician Assistant

## 2019-09-24 ENCOUNTER — Ambulatory Visit (HOSPITAL_COMMUNITY)
Admission: RE | Admit: 2019-09-24 | Discharge: 2019-09-24 | Disposition: A | Discharge status: Home / Self Care | Payer: Medicare Other | Source: Ambulatory Visit | Attending: Interventional Radiology | Admitting: Interventional Radiology

## 2019-09-24 ENCOUNTER — Encounter (HOSPITAL_COMMUNITY): Payer: Self-pay

## 2019-09-24 ENCOUNTER — Other Ambulatory Visit: Payer: Self-pay

## 2019-09-24 VITALS — BP 163/74 | HR 102 | Temp 97.9°F | Resp 16

## 2019-09-24 DIAGNOSIS — K861 Other chronic pancreatitis: Secondary | ICD-10-CM | POA: Insufficient documentation

## 2019-09-24 DIAGNOSIS — G8929 Other chronic pain: Secondary | ICD-10-CM

## 2019-09-24 HISTORY — DX: Cardiac arrhythmia, unspecified: I49.9

## 2019-09-24 LAB — CBC
HCT: 31.8 % — ABNORMAL LOW (ref 36.0–46.0)
Hemoglobin: 10.4 g/dL — ABNORMAL LOW (ref 12.0–15.0)
MCH: 33.2 pg (ref 26.0–34.0)
MCHC: 32.7 g/dL (ref 30.0–36.0)
MCV: 101.6 fL — ABNORMAL HIGH (ref 80.0–100.0)
Platelets: 171 10*3/uL (ref 150–400)
RBC: 3.13 MIL/uL — ABNORMAL LOW (ref 3.87–5.11)
RDW: 18.3 % — ABNORMAL HIGH (ref 11.5–15.5)
WBC: 9.2 10*3/uL (ref 4.0–10.5)
nRBC: 0 % (ref 0.0–0.2)

## 2019-09-24 LAB — PROTIME-INR
INR: 1 (ref 0.8–1.2)
Prothrombin Time: 13.4 seconds (ref 11.4–15.2)

## 2019-09-24 LAB — GLUCOSE, CAPILLARY: Glucose-Capillary: 94 mg/dL (ref 70–99)

## 2019-09-24 MED ORDER — ALCOHOL 98 % IJ SOLN
5.0000 mL | Freq: Once | INTRAMUSCULAR | Status: DC
  Filled 2019-09-24: qty 5

## 2019-09-24 MED ORDER — LIDOCAINE HCL (PF) 1 % IJ SOLN
INTRAMUSCULAR | Status: AC | PRN
  Administered 2019-09-24: 10 mL

## 2019-09-24 MED ORDER — SODIUM CHLORIDE 0.9 % IV SOLN: INTRAVENOUS | Status: DC

## 2019-09-24 MED ORDER — HYDROMORPHONE HCL 1 MG/ML IJ SOLN
INTRAMUSCULAR | Status: AC | PRN
  Administered 2019-09-24 (×2): 1 mg via INTRAVENOUS

## 2019-09-24 MED ORDER — BUPIVACAINE HCL (PF) 0.5 % IJ SOLN
30.0000 mL | Freq: Once | INTRAMUSCULAR | Status: DC
  Filled 2019-09-24: qty 30

## 2019-09-24 MED ORDER — FENTANYL CITRATE (PF) 100 MCG/2ML IJ SOLN
INTRAMUSCULAR | Status: AC
  Filled 2019-09-24: qty 2

## 2019-09-24 MED ORDER — HYDROMORPHONE HCL 2 MG PO TABS
ORAL_TABLET | ORAL | Status: AC
  Filled 2019-09-24: qty 2

## 2019-09-24 MED ORDER — ONDANSETRON HCL 4 MG/2ML IJ SOLN
INTRAMUSCULAR | Status: AC
  Filled 2019-09-24: qty 2

## 2019-09-24 MED ORDER — METHYLPREDNISOLONE ACETATE 80 MG/ML IJ SUSP
80.0000 mg | Freq: Once | INTRAMUSCULAR | Status: DC
  Filled 2019-09-24: qty 1

## 2019-09-24 MED ORDER — BUPIVACAINE HCL (PF) 0.25 % IJ SOLN
INTRAMUSCULAR | Status: AC
  Filled 2019-09-24: qty 30

## 2019-09-24 MED ORDER — HYDROMORPHONE HCL 2 MG/ML IJ SOLN
INTRAMUSCULAR | Status: AC
  Filled 2019-09-24: qty 1

## 2019-09-24 MED ORDER — HYDROMORPHONE HCL 2 MG PO TABS
4.0000 mg | ORAL_TABLET | Freq: Once | ORAL | Status: AC
  Administered 2019-09-24: 16:00:00 4 mg via ORAL
  Filled 2019-09-24: qty 2

## 2019-09-24 MED ORDER — METHYLPREDNISOLONE ACETATE 80 MG/ML IJ SUSP
INTRAMUSCULAR | Status: AC | PRN
  Administered 2019-09-24: 80 mg

## 2019-09-24 MED ORDER — MIDAZOLAM HCL 2 MG/2ML IJ SOLN
INTRAMUSCULAR | Status: AC | PRN
  Administered 2019-09-24 (×4): 1 mg via INTRAVENOUS

## 2019-09-24 MED ORDER — MIDAZOLAM HCL 2 MG/2ML IJ SOLN
INTRAMUSCULAR | Status: AC
  Filled 2019-09-24: qty 2

## 2019-09-24 MED ORDER — IOHEXOL 300 MG/ML  SOLN: 30.0000 mL | Freq: Once | INTRAMUSCULAR | Status: DC | PRN

## 2019-09-24 MED ORDER — HYDROMORPHONE HCL 1 MG/ML IJ SOLN: 1.0000 mg | INTRAMUSCULAR | Status: DC | PRN

## 2019-09-24 MED ORDER — MIDAZOLAM HCL 2 MG/2ML IJ SOLN
INTRAMUSCULAR | Status: AC
  Filled 2019-09-24: qty 4

## 2019-09-24 MED ORDER — METHYLPREDNISOLONE ACETATE 40 MG/ML IJ SUSP
INTRAMUSCULAR | Status: AC
  Filled 2019-09-24: qty 1

## 2019-09-24 MED ORDER — BUPIVACAINE HCL (PF) 0.5 % IJ SOLN
INTRAMUSCULAR | Status: AC | PRN
  Administered 2019-09-24: 7 mL

## 2019-09-24 MED ORDER — ALCOHOL 98 % IJ SOLN
INTRAMUSCULAR | Status: AC | PRN
  Administered 2019-09-24: 20 mL

## 2019-09-24 MED ORDER — METHYLPREDNISOLONE ACETATE 80 MG/ML IJ SUSP
INTRAMUSCULAR | Status: AC
  Filled 2019-09-24: qty 1

## 2019-09-24 NOTE — Procedures (Signed)
Interventional Radiology Procedure Note  Procedure: CT guided celiac plexus neurolytic ablation  Complications: None  Estimated Blood Loss: < 10 mL  Findings: 22 G needle advanced from anterior approach to level of celiac plexus. 3 mL of dilute contrast injected to evaluate spillage. 7 mL of 0.5% Sensorcaine with 80 mg Depo-medrol injected. 20 mL of absolute alcohol injected.  Plan: 4 hour recovery and observation.  Venetia Night. Kathlene Cote, M.D Pager:  [phone]

## 2019-09-24 NOTE — Discharge Instructions (Signed)
Needle Biopsy, Care After These instructions tell you how to care for yourself after your procedure. Your doctor may also give you more specific instructions. Call your doctor if you have any problems or questions. What can I expect after the procedure? After the procedure, it is common to have:  Soreness.  Bruising.  Mild pain. Follow these instructions at home:   Return to your normal activities as told by your doctor. Ask your doctor what activities are safe for you.  Take over-the-counter and prescription medicines only as told by your doctor.  Wash your hands with soap and water before you change your bandage (dressing). If you cannot use soap and water, use hand sanitizer.  Follow instructions from your doctor about: ? How to take care of your puncture site. ? When and how to change your bandage. ? When to remove your bandage.  Check your puncture site every day for signs of infection. Watch for: ? Redness, swelling, or pain. ? Fluid or blood. ? Pus or a bad smell. ? Warmth.  Do not take baths, swim, or use a hot tub until your doctor approves. Ask your doctor if you may take showers. You may only be allowed to take sponge baths.  Keep all follow-up visits as told by your doctor. This is important. Contact a doctor if you have:  A fever.  Redness, swelling, or pain at the puncture site, and it lasts longer than a few days.  Fluid, blood, or pus coming from the puncture site.  Warmth coming from the puncture site. Get help right away if:  You have a lot of bleeding from the puncture site. Summary  After the procedure, it is common to have soreness, bruising, or mild pain at the puncture site.  Check your puncture site every day for signs of infection, such as redness, swelling, or pain.  Get help right away if you have severe bleeding from your puncture site. This information is not intended to replace advice given to you by your health care provider. Make  sure you discuss any questions you have with your health care provider. Document Revised: 09/05/2017 Document Reviewed: 09/05/2017 Elsevier Patient Education  2020 Elsevier Inc.  Moderate Conscious Sedation, Adult, Care After These instructions provide you with information about caring for yourself after your procedure. Your health care provider may also give you more specific instructions. Your treatment has been planned according to current medical practices, but problems sometimes occur. Call your health care provider if you have any problems or questions after your procedure. What can I expect after the procedure? After your procedure, it is common:  To feel sleepy for several hours.  To feel clumsy and have poor balance for several hours.  To have poor judgment for several hours.  To vomit if you eat too soon. Follow these instructions at home: For at least 24 hours after the procedure:   Do not: ? Participate in activities where you could fall or become injured. ? Drive. ? Use heavy machinery. ? Drink alcohol. ? Take sleeping pills or medicines that cause drowsiness. ? Make important decisions or sign legal documents. ? Take care of children on your own.  Rest. Eating and drinking  Follow the diet recommended by your health care provider.  If you vomit: ? Drink water, juice, or soup when you can drink without vomiting. ? Make sure you have little or no nausea before eating solid foods. General instructions  Have a responsible adult stay with you until   you are awake and alert.  Take over-the-counter and prescription medicines only as told by your health care provider.  If you smoke, do not smoke without supervision.  Keep all follow-up visits as told by your health care provider. This is important. Contact a health care provider if:  You keep feeling nauseous or you keep vomiting.  You feel light-headed.  You develop a rash.  You have a fever. Get help  right away if:  You have trouble breathing. This information is not intended to replace advice given to you by your health care provider. Make sure you discuss any questions you have with your health care provider. Document Revised: 08/05/2017 Document Reviewed: 12/13/2015 Elsevier Patient Education  2020 Elsevier Inc.  

## 2019-09-24 NOTE — H&P (Signed)
Referring Physician(s): O'Toole,D  Supervising Physician: Aletta Edouard  Patient Status:  [facility] OP  Chief Complaint:  Abdominal pain  Subjective: Patient familiar to IR service from recent consultation with Dr. Kathlene Cote on 09/13/2019 to discuss treatment options for chronic intractable abdominal pain related to chronic pancreatitis.  She was deemed an appropriate candidate for CT-guided celiac plexus ablation and presents today for the procedure.  She is status post diagnostic celiac plexus anesthetic injection a few  months ago by Dr. Francesco Runner which provided some temporary relief.  She currently denies fever, headache, chest pain, nausea, vomiting or bleeding.  She does have some dyspnea with exertion, occasional cough, abdominal/back pain.  Additional medical history as below.  Past Medical History:  Diagnosis Date  . AAA (abdominal aortic aneurysm) ([place]) 5/09   3.7x3.3 by u/s 2009  . Abnormal EKG    deep TW inversions chronic  . Anemia   . CAD (coronary artery disease)    a. CABG 03/2011 LIMA to LAD, SVG to OM, SVG to PDA. b. cath 06/25/2015 DES to SVG to rPDA, patent LIMA to LAD, patent SVG to OM  . carotid stenosis 5/09   S/p L CEA ;  60-79% bilat ICA by preCABG dopplers 7/12  . CHF (congestive heart failure) ([place])   . Chronic back pain    "all over" (06/24/2015)  . Chronic bronchitis ([place])    "get it pretty much q yr" (06/24/2015)  . CKD (chronic kidney disease)   . Complication of anesthesia 1966   "problem w/ether"  . COPD (chronic obstructive pulmonary disease) ([place])   . Depression   . GERD (gastroesophageal reflux disease)    With hiatal hernia  . GIB (gastrointestinal bleeding) 9/11   S/p EGD with cautery at [facility]  . Heart murmur   . History of blood transfusion "a few times"   "all related to anemia" (06/24/2015)  . History of hiatal hernia   . Hyperlipidemia   . Hypertension   . Mitral regurgitation    3+ MR by intraoperative TEE;  s/p MV repair with Dr. Roxy Manns  7/12  . Myocardial infarction [facility]) 2012 "several"  . On home oxygen therapy    "2 liters at night; negative for sleep apnea"  . PAD (peripheral artery disease) ([facility])    Severe; s/p bilateral renal artery stents, moderate in-stent restenosis  . Pancreatitis 05/2018  . Paroxysmal atrial fibrillation ([facility])    coumadin;  echo 9/07: EF 60%, mild LVH;  s/p Cox Maze 7/12 with LAA clipping  . Subclavian artery stenosis, left ([facility])    stented by Dr. Trula Slade on 10/18 to help flow of her LIMA to LAD  . Type II diabetes mellitus ([place])    Past Surgical History:  Procedure Laterality Date  . A/V FISTULAGRAM Left 03/21/2018   Procedure: A/V FISTULAGRAM;  Surgeon: Serafina Mitchell, MD;  Location: [place] [facility];  Service: Cardiovascular;  Laterality: Left;  . ABDOMINAL AORTIC ENDOVASCULAR STENT GRAFT N/A 01/17/2018   Procedure: ABDOMINAL AORTIC ENDOVASCULAR STENT GRAFT WITH CO2;  Surgeon: Serafina Mitchell, MD;  Location: [place];  Service: Vascular;  Laterality: N/A;  . ABDOMINAL HYSTERECTOMY  1990's  . APPENDECTOMY  Aug. 11, 2016   Ruptured  . AV FISTULA PLACEMENT Left 11/03/2017   Procedure: ARTERIOVENOUS (AV) FISTULA CREATION LEFT ARM;  Surgeon: Serafina Mitchell, MD;  Location: [place];  Service: Vascular;  Laterality: Left;  . BOWEL RESECTION  2013  . CARDIAC CATHETERIZATION N/A 06/24/2015   Procedure:  Left Heart Cath and Cors/Grafts Angiography;  Surgeon: Leonie Man, MD;  Location: [place] [facility];  Service: Cardiovascular;  Laterality: N/A;  . CARDIAC CATHETERIZATION N/A 06/25/2015   Procedure: Coronary Stent Intervention;  Surgeon: Sherren Mocha, MD;  Location: [place] [facility];  Service: Cardiovascular;  Laterality: N/A;  . CAROTID ENDARTERECTOMY Left   . CATARACT EXTRACTION Right 03/27/2018  . CHOLECYSTECTOMY N/A 04/19/2018   Procedure: LAPAROSCOPIC CHOLECYSTECTOMY WITH INTRAOPERATIVE CHOLANGIOGRAM;  Surgeon: Rolm Bookbinder, MD;  Location: [place];  Service: General;  Laterality:  N/A;  . CORONARY ANGIOPLASTY    . CORONARY ARTERY BYPASS GRAFT  03/05/2011   CABG X3 (LIMA to LAD, SVG to OM, SVG to PDA, EVH via left thigh  . CORONARY STENT PLACEMENT    . ESOPHAGOGASTRODUODENOSCOPY N/A 04/12/2018   Procedure: ESOPHAGOGASTRODUODENOSCOPY (EGD);  Surgeon: [name], MD;  Location: [facility];  Service: Endoscopy;  Laterality: N/A;  . ESOPHAGOGASTRODUODENOSCOPY N/A 06/16/2018   Procedure: ESOPHAGOGASTRODUODENOSCOPY (EGD);  Surgeon: Thornton Park, MD;  Location: [place];  Service: Gastroenterology;  Laterality: N/A;  . IR RADIOLOGIST EVAL & MGMT  09/13/2019  . LACERATION REPAIR Right 1990's   WRIST  . MAZE  03/05/2011   complete biatrial lesion set with clipping of LA appendage  . MITRAL VALVE REPAIR  03/05/2011   57mm Memo 3D ring annuloplasty for ischemic MR  . PERIPHERAL VASCULAR BALLOON ANGIOPLASTY Left 03/21/2018   Procedure: PERIPHERAL VASCULAR BALLOON ANGIOPLASTY;  Surgeon: Serafina Mitchell, MD;  Location: [place] [facility];  Service: Cardiovascular;  Laterality: Left;  . PERIPHERAL VASCULAR CATHETERIZATION N/A 06/24/2015   Procedure: Aortic Arch Angiography;  Surgeon: Serafina Mitchell, MD;  Location: [place] [facility];  Service: Cardiovascular;  Laterality: N/A;  . PERIPHERAL VASCULAR CATHETERIZATION  06/24/2015   Procedure: Peripheral Vascular Intervention;  Surgeon: Serafina Mitchell, MD;  Location: [place] [facility];  Service: Cardiovascular;;  . PERIPHERAL VASCULAR CATHETERIZATION N/A 06/24/2015   Procedure: Abdominal Aortogram;  Surgeon: Serafina Mitchell, MD;  Location: [place] [facility];  Service: Cardiovascular;  Laterality: N/A;  . RENAL ANGIOGRAM Bilateral 09/11/2013   Procedure: RENAL ANGIOGRAM;  Surgeon: Serafina Mitchell, MD;  Location: [facility] C[facility] LAB;  Service: Cardiovascular;  Laterality: Bilateral;  . RIGHT FEMORAL-POPLITEAL BYPASS        Allergies: Isosorbide, Isosorbide nitrate, Oxycodone-acetaminophen, Codeine, Contrast media [iodinated  diagnostic agents], Ioxaglate, Metrizamide, and Percocet [oxycodone-acetaminophen]  Medications: Prior to Admission medications   Medication Sig Start Date End Date Taking? Authorizing Provider  Albuterol Sulfate (PROAIR RESPICLICK) 779 (90 Base) MCG/ACT AEPB Inhale 1 puff into the lungs every 4 (four) hours as needed (shortness of breath/wheezing).    [provider]  allopurinol (ZYLOPRIM) 100 MG tablet Take 100 mg by mouth daily. 02/02/17   [provider]  Amino Acids-Protein Hydrolys (FEEDING SUPPLEMENT, PRO-STAT SUGAR FREE 64,) LIQD Take 30 mLs by mouth 3 (three) times daily with meals. 06/26/18   Thurnell Lose, MD  amLODipine (NORVASC) 5 MG tablet Take 1 tablet (5 mg total) by mouth daily. 08/23/18   Mikhail, Velta Addison, DO  atorvastatin (LIPITOR) 40 MG tablet Take 40 mg by mouth daily.  12/07/17   [provider]  calcium carbonate (TUMS EX) 750 MG chewable tablet Chew 2 tablets by mouth 3 (three) times daily as needed for heartburn.     [provider]  carvedilol (COREG) 25 MG tablet Take 25 mg by mouth 2 (two) times daily. 04/16/19   [provider]  CREON 12000  units CPEP capsule Take 2 capsules (24,000 Units total) by mouth 3 (three) times daily before meals. 05/18/19   Regalado, Belkys A, MD  diphenhydrAMINE (BENADRYL) 25 MG tablet Take 50 mg by mouth at bedtime.    [provider]  feeding supplement (BOOST / RESOURCE BREEZE) LIQD Take 1 Container by mouth 2 (two) times daily.    [provider]  gabapentin (NEURONTIN) 300 MG capsule Take 1 capsule (300 mg total) by mouth at bedtime. 08/22/18   Mikhail, Velta Addison, DO  hydrALAZINE (APRESOLINE) 25 MG tablet Take 1 tablet (25 mg total) by mouth every 8 (eight) hours. 06/26/18   Thurnell Lose, MD  HYDROmorphone (DILAUDID) 2 MG tablet Take 2 mg by mouth every 4 (four) hours.     [provider]  OXYGEN Inhale into the lungs at bedtime.    [provider]    pantoprazole (PROTONIX) 40 MG tablet Take 1 tablet (40 mg total) by mouth daily. 08/03/18   Patrecia Pour, MD  polyethylene glycol (MIRALAX / Floria Raveling) packet Take 17 g by mouth daily as needed for mild constipation. 06/26/18   Thurnell Lose, MD  promethazine (PHENERGAN) 12.5 MG tablet Take 12.5 mg by mouth every 4 (four) hours as needed for nausea or vomiting.    [provider]  traZODone (DESYREL) 100 MG tablet Take 150 mg by mouth at bedtime.  04/19/19   [provider]     Vital Signs: Blood pressure 139/94, temp 98.7, heart rate 81, respiration 18, O2 sat 95% room air   Physical Exam awake, alert.  Chest with few inspiratory wheezes on the right, distant breath sounds bilaterally.  Heart with normal rate, occasional ectopy noted.  Abdomen soft, positive bowel sounds, some mild epigastric tenderness to palpation.  Bilateral lower extremity edema noted.  Left arm AV fistula with good thrill/bruit.  Imaging: No results found.  Labs:  CBC: Recent Labs    07/29/19 0511 07/30/19 0209 07/30/19 0724 08/23/19 1238  WBC 5.4 6.6 6.7 9.0  HGB 10.2* 10.2* 11.4* 12.4  HCT 32.7* 31.3* 35.8* 37.6  PLT 167 164 195 272    COAGS: No results for input(s): INR, APTT in the last 8760 hours.  BMP: Recent Labs    07/28/19 0439 07/29/19 0511 07/30/19 0209 08/23/19 1238  NA 135 139 137 134*  K 3.7 3.8 4.1 4.1  CL 101 104 104 99  CO2 26 30 27  21*  GLUCOSE 85 79 120* 141*  BUN 11 <5* 6* 34*  CALCIUM 7.9* 7.8* 7.8* 8.2*  CREATININE 2.14* 1.30* 1.70* 3.08*  GFRNONAA 23* 43* 31* 15*  GFRAA 27* 50* 36* 17*    LIVER FUNCTION TESTS: Recent Labs    05/16/19 0448 05/16/19 0448 05/17/19 0810 07/12/19 1913 07/26/19 1420 08/23/19 1238  BILITOT 0.5  --   --  0.9 0.8 0.9  AST 14*  --   --  15 18 18   ALT 9  --   --  9 11 9   ALKPHOS 60  --   --  90 80 112  PROT 4.2*  --   --  5.3* 5.6* 6.0*  ALBUMIN 2.0*   < > 2.2* 2.7* 2.7* 2.8*   < > = values in this interval  not displayed.    Assessment and Plan: Patient with history of chronic intractable abdominal pain secondary to chronic pancreatitis and prior fluoroscopic guided celiac plexus anesthetic injection few months ago by Dr. Francesco Runner.  She was seen in consultation by  Dr. Kathlene Cote on 09/13/2019 and deemed an appropriate candidate for more definitive CT-guided celiac plexus ablation to hopefully provide longer term benefit/relief of abdominal pain.  Details/risks of procedure, including but not limited to, internal bleeding, infection, injury to adjacent structures, inability to eradicate abdominal pain discussed with patient with her understanding and consent.   Electronically Signed: D. Rowe Robert, PA-C 09/24/2019, 9:37 AM   I spent a total of 25 minutes  at the the patient's bedside AND on the patient's hospital floor or unit, greater than 50% of which was counseling/coordinating care for CT-guided celiac plexus ablation

## 2019-09-26 ENCOUNTER — Ambulatory Visit: Payer: Medicare Other | Admitting: Podiatry

## 2019-09-27 IMAGING — MR MR HEAD W/O CM
10 of 12 series · 34 of 48 positions shown · non-contrast
Comparison: Head CT 04/09/2018.  Brain MRI 08/31/2006

CLINICAL DATA: Confusion

EXAM:
MRI HEAD WITHOUT CONTRAST
TECHNIQUE: Multiplanar, multiecho pulse sequences of the brain and surrounding
structures were obtained without intravenous contrast.

[Series 5: ax dwi_tracew · axial · 3.0mm · 1.50mm/px · z∈[-27,+110]mm · 7 of 94 slices shown]
[im 1/94]
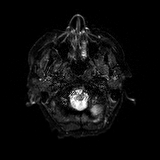
[im 16/94]
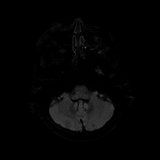
[im 32/94]
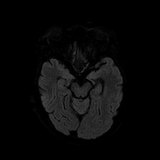
[im 47/94]
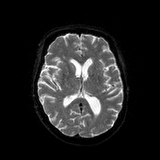
[im 63/94]
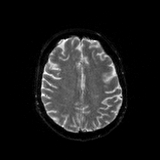
[im 78/94]
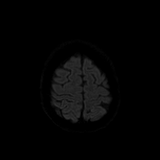
[im 94/94]
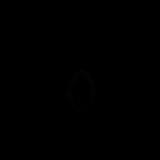

[Series 6: ax dwi_adc · axial · 3.0mm · 1.50mm/px · z∈[-27,+107]mm · 3 of 46 slices shown]
[im 1/46]
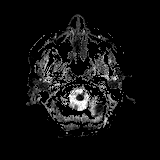
[im 23/46]
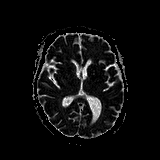
[im 46/46]
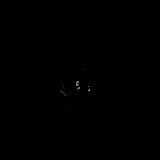

[Series 7: cor dwi_tracew · coronal · 5.0mm · 1.44mm/px · 5 of 64 slices shown]
[im 1/64]
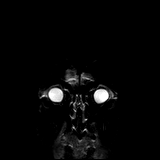
[im 16/64]
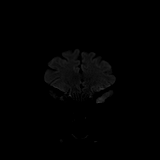
[im 32/64]
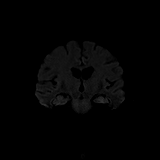
[im 48/64]
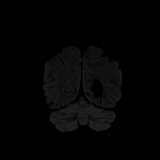
[im 64/64]
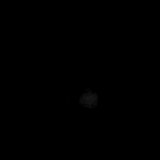

[Series 8: cor dwi_adc · coronal · 5.0mm · 1.44mm/px · 2 of 32 slices shown]
[im 1/32]
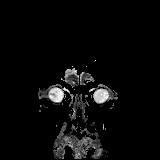
[im 32/32]
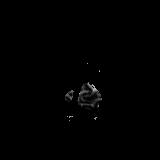

[Series 9: T1 · sagittal · 5.0mm · 0.75mm/px · 2 of 23 slices shown]
[im 1/23]
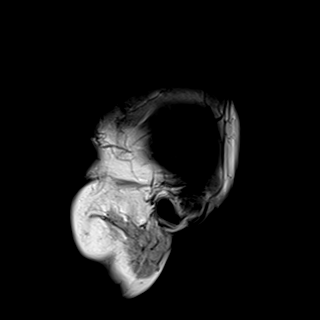
[im 23/23]
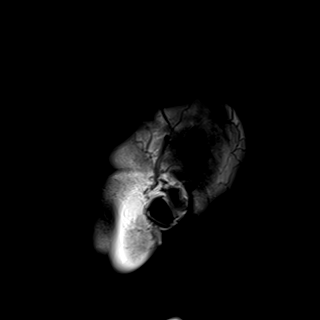

[Series 10: T2 · axial · 5.0mm · 0.72mm/px · z∈[-28,+116]mm · 2 of 25 slices shown (1 of 2)]
[im 1/25]
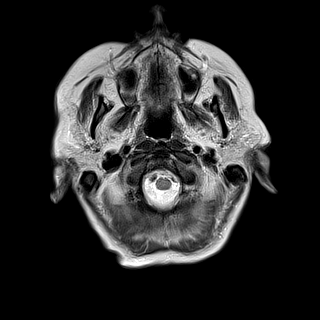
[im 25/25]
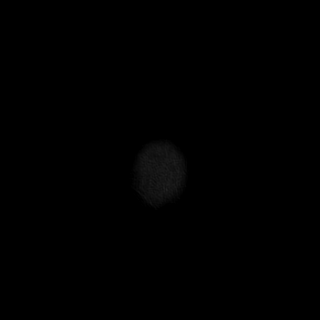

[Series 11: FLAIR · axial · 5.0mm · 0.45mm/px · z∈[-26,+117]mm · 2 of 25 slices shown]
[im 1/25]
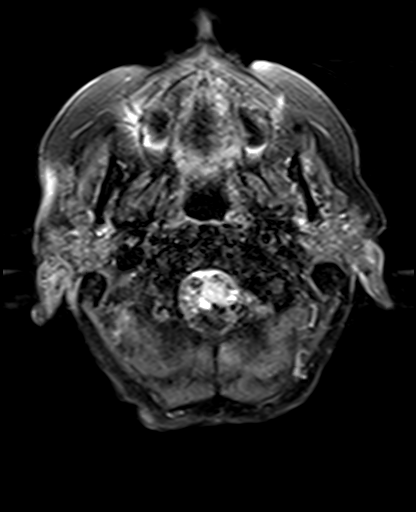
[im 25/25]
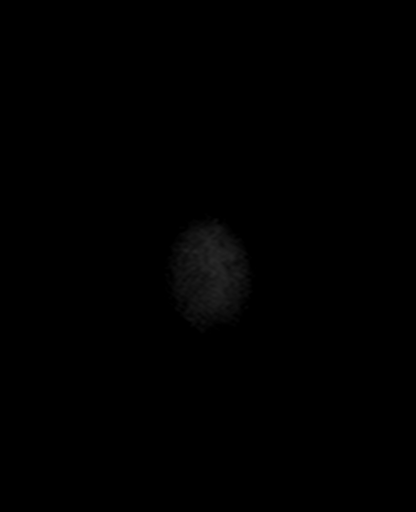

[Series 12: swi_images · axial · 3.0mm · 0.90mm/px · z∈[-41,+135]mm · 5 of 60 slices shown]
[im 1/60]
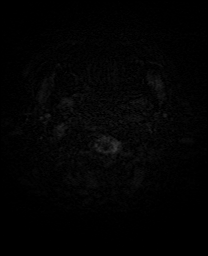
[im 15/60]
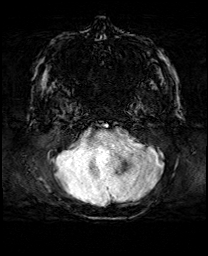
[im 30/60]
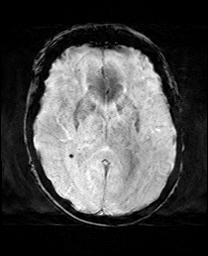
[im 45/60]
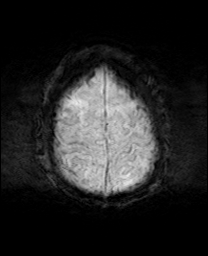
[im 60/60]
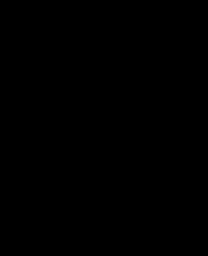

[Series 13: mip_images(sw) · axial · 24.0mm · 0.90mm/px · z∈[-31,+125]mm · 4 of 53 slices shown]
[im 1/53]
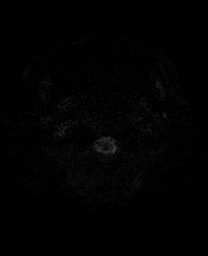
[im 18/53]
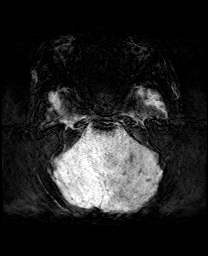
[im 35/53]
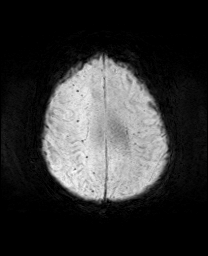
[im 53/53]
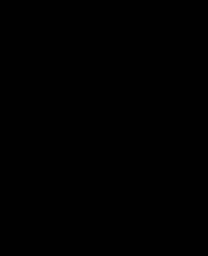

[Series 15: T2 · coronal · 5.0mm · 0.34mm/px · 2 of 29 slices shown (2 of 2)]
[im 1/29]
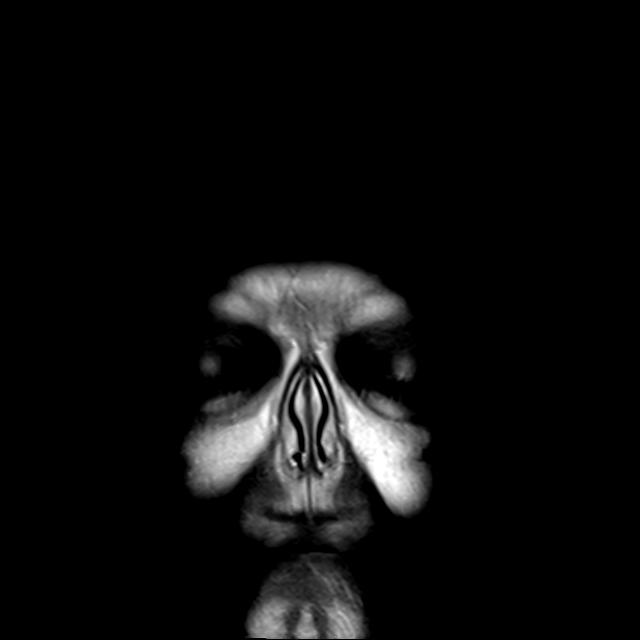
[im 29/29]
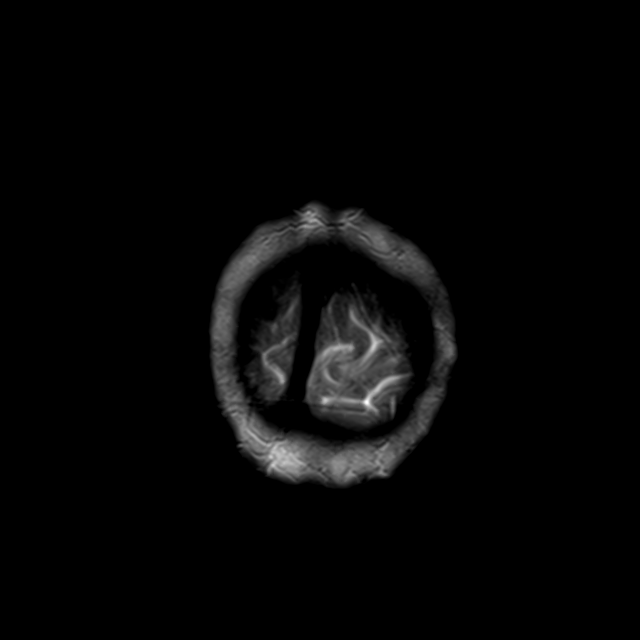

[34 of 48 positions shown; findings below may reference images not displayed]

FINDINGS: Brain: No acute infarction, hemorrhage, hydrocephalus, extra-axial
collection or mass lesion. Mild patchy FLAIR hyperintensity in the
cerebral white matter, usually from old microvascular insults. There
are remote micro hemorrhages in the right cerebral hemisphere
roughly aligned along the watershed. No associated gliosis is seen.
Patient has a history of endarterectomy, but this was left-sided.
Unilaterally and lack of significant white matter disease argues
against amyloid angiopathy. Cerebral volume loss since 0558, but
mild for age.

Vascular: Major flow voids are preserved

Skull and upper cervical spine: No evidence of marrow lesion

Sinuses/Orbits: Right cataract resection
IMPRESSION: No acute or reversible finding. No specific explanation for
confusion.

## 2019-10-16 ENCOUNTER — Inpatient Hospital Stay
Admission: EM | Admit: 2019-10-16 | Discharge: 2019-10-25 | DRG: 438 | Disposition: A | Discharge status: Home / Self Care | Payer: Medicare Other | Attending: Internal Medicine | Admitting: Internal Medicine

## 2019-10-16 ENCOUNTER — Emergency Department: Payer: Medicare Other

## 2019-10-16 ENCOUNTER — Other Ambulatory Visit: Payer: Self-pay

## 2019-10-16 ENCOUNTER — Encounter: Payer: Self-pay | Admitting: Emergency Medicine

## 2019-10-16 DIAGNOSIS — Z825 Family history of asthma and other chronic lower respiratory diseases: Secondary | ICD-10-CM | POA: Diagnosis not present

## 2019-10-16 DIAGNOSIS — E785 Hyperlipidemia, unspecified: Secondary | ICD-10-CM | POA: Diagnosis present

## 2019-10-16 DIAGNOSIS — Z8349 Family history of other endocrine, nutritional and metabolic diseases: Secondary | ICD-10-CM

## 2019-10-16 DIAGNOSIS — I2510 Atherosclerotic heart disease of native coronary artery without angina pectoris: Secondary | ICD-10-CM | POA: Diagnosis present

## 2019-10-16 DIAGNOSIS — R011 Cardiac murmur, unspecified: Secondary | ICD-10-CM | POA: Diagnosis present

## 2019-10-16 DIAGNOSIS — I714 Abdominal aortic aneurysm, without rupture, unspecified: Secondary | ICD-10-CM | POA: Diagnosis present

## 2019-10-16 DIAGNOSIS — K859 Acute pancreatitis without necrosis or infection, unspecified: Secondary | ICD-10-CM | POA: Diagnosis present

## 2019-10-16 DIAGNOSIS — Z9981 Dependence on supplemental oxygen: Secondary | ICD-10-CM | POA: Diagnosis not present

## 2019-10-16 DIAGNOSIS — K861 Other chronic pancreatitis: Secondary | ICD-10-CM | POA: Diagnosis present

## 2019-10-16 DIAGNOSIS — N186 End stage renal disease: Secondary | ICD-10-CM

## 2019-10-16 DIAGNOSIS — I4891 Unspecified atrial fibrillation: Secondary | ICD-10-CM | POA: Diagnosis present

## 2019-10-16 DIAGNOSIS — I252 Old myocardial infarction: Secondary | ICD-10-CM | POA: Diagnosis not present

## 2019-10-16 DIAGNOSIS — E43 Unspecified severe protein-calorie malnutrition: Secondary | ICD-10-CM | POA: Diagnosis present

## 2019-10-16 DIAGNOSIS — Z7902 Long term (current) use of antithrombotics/antiplatelets: Secondary | ICD-10-CM | POA: Diagnosis not present

## 2019-10-16 DIAGNOSIS — D631 Anemia in chronic kidney disease: Secondary | ICD-10-CM | POA: Diagnosis present

## 2019-10-16 DIAGNOSIS — G8929 Other chronic pain: Secondary | ICD-10-CM | POA: Diagnosis present

## 2019-10-16 DIAGNOSIS — K219 Gastro-esophageal reflux disease without esophagitis: Secondary | ICD-10-CM | POA: Diagnosis present

## 2019-10-16 DIAGNOSIS — J4489 Other specified chronic obstructive pulmonary disease: Secondary | ICD-10-CM

## 2019-10-16 DIAGNOSIS — R109 Unspecified abdominal pain: Secondary | ICD-10-CM | POA: Diagnosis present

## 2019-10-16 DIAGNOSIS — Z8249 Family history of ischemic heart disease and other diseases of the circulatory system: Secondary | ICD-10-CM

## 2019-10-16 DIAGNOSIS — E1122 Type 2 diabetes mellitus with diabetic chronic kidney disease: Secondary | ICD-10-CM | POA: Diagnosis present

## 2019-10-16 DIAGNOSIS — J449 Chronic obstructive pulmonary disease, unspecified: Secondary | ICD-10-CM | POA: Diagnosis present

## 2019-10-16 DIAGNOSIS — I480 Paroxysmal atrial fibrillation: Secondary | ICD-10-CM | POA: Diagnosis present

## 2019-10-16 DIAGNOSIS — K449 Diaphragmatic hernia without obstruction or gangrene: Secondary | ICD-10-CM | POA: Diagnosis present

## 2019-10-16 DIAGNOSIS — I4510 Unspecified right bundle-branch block: Secondary | ICD-10-CM | POA: Diagnosis present

## 2019-10-16 DIAGNOSIS — E876 Hypokalemia: Secondary | ICD-10-CM | POA: Diagnosis not present

## 2019-10-16 DIAGNOSIS — Z992 Dependence on renal dialysis: Secondary | ICD-10-CM | POA: Diagnosis not present

## 2019-10-16 DIAGNOSIS — I5032 Chronic diastolic (congestive) heart failure: Secondary | ICD-10-CM | POA: Diagnosis present

## 2019-10-16 DIAGNOSIS — Z951 Presence of aortocoronary bypass graft: Secondary | ICD-10-CM

## 2019-10-16 DIAGNOSIS — F17210 Nicotine dependence, cigarettes, uncomplicated: Secondary | ICD-10-CM | POA: Diagnosis present

## 2019-10-16 DIAGNOSIS — IMO0002 Reserved for concepts with insufficient information to code with codable children: Secondary | ICD-10-CM | POA: Diagnosis present

## 2019-10-16 DIAGNOSIS — Z681 Body mass index (BMI) 19 or less, adult: Secondary | ICD-10-CM | POA: Diagnosis not present

## 2019-10-16 DIAGNOSIS — G4733 Obstructive sleep apnea (adult) (pediatric): Secondary | ICD-10-CM | POA: Diagnosis present

## 2019-10-16 DIAGNOSIS — E1151 Type 2 diabetes mellitus with diabetic peripheral angiopathy without gangrene: Secondary | ICD-10-CM | POA: Diagnosis present

## 2019-10-16 DIAGNOSIS — E1129 Type 2 diabetes mellitus with other diabetic kidney complication: Secondary | ICD-10-CM | POA: Diagnosis present

## 2019-10-16 DIAGNOSIS — Z20822 Contact with and (suspected) exposure to covid-19: Secondary | ICD-10-CM | POA: Diagnosis present

## 2019-10-16 DIAGNOSIS — K2980 Duodenitis without bleeding: Secondary | ICD-10-CM | POA: Diagnosis present

## 2019-10-16 DIAGNOSIS — I10 Essential (primary) hypertension: Secondary | ICD-10-CM | POA: Diagnosis present

## 2019-10-16 DIAGNOSIS — I132 Hypertensive heart and chronic kidney disease with heart failure and with stage 5 chronic kidney disease, or end stage renal disease: Secondary | ICD-10-CM | POA: Diagnosis present

## 2019-10-16 DIAGNOSIS — Z833 Family history of diabetes mellitus: Secondary | ICD-10-CM

## 2019-10-16 LAB — URINALYSIS, COMPLETE (UACMP) WITH MICROSCOPIC
Bacteria, UA: NONE SEEN
Bilirubin Urine: NEGATIVE
Glucose, UA: NEGATIVE mg/dL
Hgb urine dipstick: NEGATIVE
Ketones, ur: NEGATIVE mg/dL
Leukocytes,Ua: NEGATIVE
Nitrite: NEGATIVE
Protein, ur: NEGATIVE mg/dL
Specific Gravity, Urine: 1.017 (ref 1.005–1.030)
pH: 5 (ref 5.0–8.0)

## 2019-10-16 LAB — COMPREHENSIVE METABOLIC PANEL
ALT: 15 U/L (ref 0–44)
AST: 20 U/L (ref 15–41)
Albumin: 2.4 g/dL — ABNORMAL LOW (ref 3.5–5.0)
Alkaline Phosphatase: 152 U/L — ABNORMAL HIGH (ref 38–126)
Anion gap: 14 (ref 5–15)
BUN: 26 mg/dL — ABNORMAL HIGH (ref 8–23)
CO2: 24 mmol/L (ref 22–32)
Calcium: 8.1 mg/dL — ABNORMAL LOW (ref 8.9–10.3)
Chloride: 96 mmol/L — ABNORMAL LOW (ref 98–111)
Creatinine, Ser: 3.75 mg/dL — ABNORMAL HIGH (ref 0.44–1.00)
GFR calc Af Amer: 14 mL/min — ABNORMAL LOW (ref >60)
GFR calc non Af Amer: 12 mL/min — ABNORMAL LOW (ref >60)
Glucose, Bld: 127 mg/dL — ABNORMAL HIGH (ref 70–99)
Potassium: 4.5 mmol/L (ref 3.5–5.1)
Sodium: 134 mmol/L — ABNORMAL LOW (ref 135–145)
Total Bilirubin: 0.9 mg/dL (ref 0.3–1.2)
Total Protein: 5.2 g/dL — ABNORMAL LOW (ref 6.5–8.1)

## 2019-10-16 LAB — CBC
HCT: 32.4 % — ABNORMAL LOW (ref 36.0–46.0)
Hemoglobin: 10.6 g/dL — ABNORMAL LOW (ref 12.0–15.0)
MCH: 33.4 pg (ref 26.0–34.0)
MCHC: 32.7 g/dL (ref 30.0–36.0)
MCV: 102.2 fL — ABNORMAL HIGH (ref 80.0–100.0)
Platelets: 219 10*3/uL (ref 150–400)
RBC: 3.17 MIL/uL — ABNORMAL LOW (ref 3.87–5.11)
RDW: 15.8 % — ABNORMAL HIGH (ref 11.5–15.5)
WBC: 16 10*3/uL — ABNORMAL HIGH (ref 4.0–10.5)
nRBC: 0 % (ref 0.0–0.2)

## 2019-10-16 LAB — GLUCOSE, CAPILLARY: Glucose-Capillary: 93 mg/dL (ref 70–99)

## 2019-10-16 LAB — LIPASE, BLOOD: Lipase: 23 U/L (ref 11–51)

## 2019-10-16 MED ORDER — ENOXAPARIN SODIUM 40 MG/0.4ML ~~LOC~~ SOLN: 40.0000 mg | SUBCUTANEOUS | Status: DC

## 2019-10-16 MED ORDER — HYDROMORPHONE HCL 1 MG/ML IJ SOLN
1.0000 mg | Freq: Once | INTRAMUSCULAR | Status: AC
  Administered 2019-10-16: 1 mg via INTRAVENOUS
  Filled 2019-10-16: qty 1

## 2019-10-16 MED ORDER — INSULIN ASPART 100 UNIT/ML ~~LOC~~ SOLN
0.0000 [IU] | SUBCUTANEOUS | Status: DC
  Administered 2019-10-18: 2 [IU] via SUBCUTANEOUS
  Administered 2019-10-20 – 2019-10-22 (×3): 3 [IU] via SUBCUTANEOUS
  Administered 2019-10-22: 2 [IU] via SUBCUTANEOUS
  Administered 2019-10-23 (×2): 1 [IU] via SUBCUTANEOUS
  Administered 2019-10-24: 2 [IU] via SUBCUTANEOUS
  Administered 2019-10-24: 3 [IU] via SUBCUTANEOUS
  Administered 2019-10-24: 2 [IU] via SUBCUTANEOUS
  Filled 2019-10-16 (×11): qty 1

## 2019-10-16 MED ORDER — ONDANSETRON HCL 4 MG PO TABS: 4.0000 mg | ORAL_TABLET | Freq: Four times a day (QID) | ORAL | Status: DC | PRN

## 2019-10-16 MED ORDER — SODIUM CHLORIDE 0.9 % IV SOLN: INTRAVENOUS | Status: DC

## 2019-10-16 MED ORDER — PANTOPRAZOLE SODIUM 40 MG IV SOLR
40.0000 mg | Freq: Once | INTRAVENOUS | Status: AC
  Administered 2019-10-16: 40 mg via INTRAVENOUS
  Filled 2019-10-16: qty 40

## 2019-10-16 MED ORDER — ONDANSETRON HCL 4 MG/2ML IJ SOLN
4.0000 mg | Freq: Four times a day (QID) | INTRAMUSCULAR | Status: DC | PRN
  Administered 2019-10-18 – 2019-10-24 (×5): 4 mg via INTRAVENOUS
  Filled 2019-10-16 (×5): qty 2

## 2019-10-16 MED ORDER — ONDANSETRON HCL 4 MG/2ML IJ SOLN
4.0000 mg | Freq: Once | INTRAMUSCULAR | Status: AC
  Administered 2019-10-16: 19:00:00 4 mg via INTRAVENOUS
  Filled 2019-10-16: qty 2

## 2019-10-16 MED ORDER — HYDROMORPHONE HCL 1 MG/ML IJ SOLN
1.0000 mg | INTRAMUSCULAR | Status: DC | PRN
  Administered 2019-10-17 – 2019-10-19 (×15): 1 mg via INTRAVENOUS
  Filled 2019-10-16 (×15): qty 1

## 2019-10-16 MED ORDER — HEPARIN SODIUM (PORCINE) 5000 UNIT/ML IJ SOLN
5000.0000 [IU] | Freq: Three times a day (TID) | INTRAMUSCULAR | Status: DC
  Administered 2019-10-16 – 2019-10-24 (×21): 5000 [IU] via SUBCUTANEOUS
  Filled 2019-10-16 (×23): qty 1

## 2019-10-16 MED ORDER — HYDROMORPHONE HCL 1 MG/ML IJ SOLN
1.0000 mg | Freq: Once | INTRAMUSCULAR | Status: AC
  Administered 2019-10-16: 21:00:00 1 mg via INTRAVENOUS
  Filled 2019-10-16: qty 1

## 2019-10-16 NOTE — H&P (Signed)
History and Physical    [name] DDU:[id number] DOB: 12/01/1950 DOA: 10/16/2019  PCP: Bernerd Limbo, MD   Patient coming from: home  I have personally briefly reviewed patient's old medical records in [place]  Chief Complaint: Nausea and abdominal pain x3 days  HPI: [name] is a 69 y.o. female with medical history significant for ESRD on HD TTS, CAD status post CABG, COPD, A. fib, hypertension, COPD and chronic pancreatitis with chronic pain on Creon and oral Dilaudid with recurrent hospitalizations for the same, most recently November 2020, who presents to the emergency room with a 3-day history of nausea and abdominal pain typical for her acute pancreatitis flares.  Abdominal pain is mostly in the epigastrium of severe intensity and nonradiating, aggravated by attempting to eat or drink.  She did vomit once on the day of arrival.  Also states she has been having a loose explosive bowel movement daily.  No history of recent antibiotic use.  She denies fever, chills, cough, chest pain and shortness of breath.  ED Course: On arrival in the emergency room she was afebrile with blood pressure 131/43, heart rate 70, RR 16 with O2 sat 97% on room air.  On her blood work, white cell count was 16,000, hemoglobin 10.6.  Lipase was 23 blood work was in keeping with dialysis patient with normal potassium 4.5 and bicarb of 24.  Urinalysis unremarkable Covid antigen test negative.  CT abdomen and pelvis showed findings consistent with acute on chronic pancreatitis involving pancreatic head and proximal body with adjacent resultant duodenitis.  Aortobiiliac stent graft also seen with unchanged native aneurysm sac size measuring 3.9 cm.  Patient required several doses of Dilaudid in the emergency room for pain control as well as antiemetics.  Hospitalist consulted for admission.  Review of Systems: As per HPI otherwise 10 point review of systems negative.    Past Medical History:  Diagnosis  Date  . AAA (abdominal aortic aneurysm) ([place]) 5/09   3.7x3.3 by u/s 2009  . Abnormal EKG    deep TW inversions chronic  . Anemia   . CAD (coronary artery disease)    a. CABG 03/2011 LIMA to LAD, SVG to OM, SVG to PDA. b. cath 06/25/2015 DES to SVG to rPDA, patent LIMA to LAD, patent SVG to OM  . carotid stenosis 5/09   S/p L CEA ;  60-79% bilat ICA by preCABG dopplers 7/12  . CHF (congestive heart failure) ([place])   . Chronic back pain    "all over" (06/24/2015)  . Chronic bronchitis ([place])    "get it pretty much q yr" (06/24/2015)  . CKD (chronic kidney disease)   . Complication of anesthesia 1966   "problem w/ether"  . COPD (chronic obstructive pulmonary disease) ([place])   . Depression   . Dysrhythmia   . GERD (gastroesophageal reflux disease)    With hiatal hernia  . GIB (gastrointestinal bleeding) 9/11   S/p EGD with cautery at [facility]  . Heart murmur   . History of blood transfusion "a few times"   "all related to anemia" (06/24/2015)  . History of hiatal hernia   . Hyperlipidemia   . Hypertension   . Mitral regurgitation    3+ MR by intraoperative TEE;  s/p MV repair with Dr. Roxy Manns 7/12  . Myocardial infarction [facility]) 2012 "several"  . On home oxygen therapy    "2 liters at night; negative for sleep apnea"  . PAD (peripheral artery disease) ([place])  Severe; s/p bilateral renal artery stents, moderate in-stent restenosis  . Pancreatitis 05/2018  . Paroxysmal atrial fibrillation (HCC)    coumadin;  echo 9/07: EF 60%, mild LVH;  s/p Cox Maze 7/12 with LAA clipping  . Subclavian artery stenosis, left (HCC)    stented by Dr. Trula Slade on 10/18 to help flow of her LIMA to LAD  . Type II diabetes mellitus ([place])     Past Surgical History:  Procedure Laterality Date  . A/V FISTULAGRAM Left 03/21/2018   Procedure: A/V FISTULAGRAM;  Surgeon: Serafina Mitchell, MD;  Location: [place] [facility];  Service: Cardiovascular;  Laterality: Left;  . ABDOMINAL AORTIC ENDOVASCULAR STENT GRAFT N/A  01/17/2018   Procedure: ABDOMINAL AORTIC ENDOVASCULAR STENT GRAFT WITH CO2;  Surgeon: Serafina Mitchell, MD;  Location: [place];  Service: Vascular;  Laterality: N/A;  . ABDOMINAL HYSTERECTOMY  1990's  . APPENDECTOMY  Aug. 11, 2016   Ruptured  . AV FISTULA PLACEMENT Left 11/03/2017   Procedure: ARTERIOVENOUS (AV) FISTULA CREATION LEFT ARM;  Surgeon: Serafina Mitchell, MD;  Location: [place];  Service: Vascular;  Laterality: Left;  . BOWEL RESECTION  2013  . CARDIAC CATHETERIZATION N/A 06/24/2015   Procedure: Left Heart Cath and Cors/Grafts Angiography;  Surgeon: Leonie Man, MD;  Location: [place] [facility];  Service: Cardiovascular;  Laterality: N/A;  . CARDIAC CATHETERIZATION N/A 06/25/2015   Procedure: Coronary Stent Intervention;  Surgeon: Sherren Mocha, MD;  Location: [place] [facility];  Service: Cardiovascular;  Laterality: N/A;  . CAROTID ENDARTERECTOMY Left   . CATARACT EXTRACTION Right 03/27/2018  . CHOLECYSTECTOMY N/A 04/19/2018   Procedure: LAPAROSCOPIC CHOLECYSTECTOMY WITH INTRAOPERATIVE CHOLANGIOGRAM;  Surgeon: Rolm Bookbinder, MD;  Location: [place];  Service: General;  Laterality: N/A;  . CORONARY ANGIOPLASTY    . CORONARY ARTERY BYPASS GRAFT  03/05/2011   CABG X3 (LIMA to LAD, SVG to OM, SVG to PDA, EVH via left thigh  . CORONARY STENT PLACEMENT    . ESOPHAGOGASTRODUODENOSCOPY N/A 04/12/2018   Procedure: ESOPHAGOGASTRODUODENOSCOPY (EGD);  Surgeon: [name], MD;  Location[facility]COPY;  Service: Endoscopy;  Laterality: N/A;  . ESOPHAGOGASTRODUODENOSCOPY N/A 06/16/2018   Procedure: ESOPHAGOGASTRODUODENOSCOPY (EGD);  Surgeon: Thornton Park, MD;  Location: [place];  Service: Gastroenterology;  Laterality: N/A;  . IR RADIOLOGIST EVAL & MGMT  09/13/2019  . LACERATION REPAIR Right 1990's   WRIST  . MAZE  03/05/2011   complete biatrial lesion set with clipping of LA appendage  . MITRAL VALVE REPAIR  03/05/2011   65mm Memo 3D ring annuloplasty for ischemic MR  .  PERIPHERAL VASCULAR BALLOON ANGIOPLASTY Left 03/21/2018   Procedure: PERIPHERAL VASCULAR BALLOON ANGIOPLASTY;  Surgeon: Serafina Mitchell, MD;  Location: [place] [facility];  Service: Cardiovascular;  Laterality: Left;  . PERIPHERAL VASCULAR CATHETERIZATION N/A 06/24/2015   Procedure: Aortic Arch Angiography;  Surgeon: Serafina Mitchell, MD;  Location: [place] [facility];  Service: Cardiovascular;  Laterality: N/A;  . PERIPHERAL VASCULAR CATHETERIZATION  06/24/2015   Procedure: Peripheral Vascular Intervention;  Surgeon: Serafina Mitchell, MD;  Location: [place] [facility];  Service: Cardiovascular;;  . PERIPHERAL VASCULAR CATHETERIZATION N/A 06/24/2015   Procedure: Abdominal Aortogram;  Surgeon: Serafina Mitchell, MD;  Location: [place] [facility];  Service: Cardiovascular;  Laterality: N/A;  . RENAL ANGIOGRAM Bilateral 09/11/2013   Procedure: RENAL ANGIOGRAM;  Surgeon: Serafina Mitchell, MD;  Location[facility];  Service: Cardiovascular;  Laterality: Bilateral;  . RIGHT FEMORAL-POPLITEAL BYPASS  reports that she has been smoking cigarettes. She has a 37.50 pack-year smoking history. She has never used smokeless tobacco. She reports current alcohol use. She reports current drug use. Drug: Marijuana.  Allergies  Allergen Reactions  . Isosorbide Other (See Comments)    Can only tolerate in low doses (unknown reaction) Other reaction(s): Other Unknown "FELT LIKE I WAS GOING TO EXPLODE FROM INSIDE OUT"  . Codeine Itching  . Contrast Media [Iodinated Diagnostic Agents] Itching and Other (See Comments)    Itching of feet  . Ioxaglate Itching and Other (See Comments)    Itching of feet  . Isosorbide Nitrate Other (See Comments)    Unknown   . Metrizamide Itching and Other (See Comments)    Itching of feet  . Oxycodone-Acetaminophen Itching  . Percocet [Oxycodone-Acetaminophen] Itching and Other (See Comments)    Tolerates Hydrocodone    Family History  Problem Relation Age of Onset  .  Emphysema Mother   . Heart disease Mother        before age 20  . Hypertension Mother   . Hyperlipidemia Mother   . Heart attack Mother   . AAA (abdominal aortic aneurysm) Mother        rupture  . Heart attack Father 79  . Emphysema Father   . Heart disease Father        before age 30  . Hyperlipidemia Father   . Hypertension Father   . Peripheral vascular disease Father   . Diabetes Brother   . Heart disease Brother        before age 58  . Hyperlipidemia Brother   . Hypertension Brother   . Heart attack Brother        CABG  . Heart disease Other        Vascular disease in grandparents, uncles and dad  . Colon cancer Neg Hx   . Esophageal cancer Neg Hx   . Inflammatory bowel disease Neg Hx   . Liver disease Neg Hx   . Pancreatic cancer Neg Hx   . Rectal cancer Neg Hx   . Stomach cancer Neg Hx      Prior to Admission medications   Medication Sig Start Date End Date Taking? Authorizing Provider  Albuterol Sulfate (PROAIR RESPICLICK) 283 (90 Base) MCG/ACT AEPB Inhale 1 puff into the lungs every 4 (four) hours as needed (shortness of breath/wheezing).   Yes [provider]  allopurinol (ZYLOPRIM) 100 MG tablet Take 100 mg by mouth daily. 02/02/17  Yes [provider]  amLODipine (NORVASC) 5 MG tablet Take 1 tablet (5 mg total) by mouth daily. Patient taking differently: Take 10 mg by mouth daily.  08/23/18  Yes Mikhail, Maryann, DO  atorvastatin (LIPITOR) 40 MG tablet Take 40 mg by mouth daily.  12/07/17  Yes [provider]  B Complex-C-Zn-Folic Acid (DIALYVITE 151-VOHY 15) 0.8 MG TABS Take 1 tablet by mouth daily. 08/09/19  Yes [provider]  carvedilol (COREG) 25 MG tablet Take 25 mg by mouth 2 (two) times daily. 04/16/19  Yes [provider]  clopidogrel (PLAVIX) 75 MG tablet Take 75 mg by mouth daily. 10/02/19  Yes [provider]  CREON 12000 units CPEP capsule Take 2 capsules (24,000 Units total) by mouth 3 (three) times  daily before meals. 05/18/19  Yes Regalado, Belkys A, MD  diphenhydrAMINE (BENADRYL) 25 MG tablet Take 50 mg by mouth at bedtime.   Yes [provider]  gabapentin (NEURONTIN) 300 MG capsule Take 1  capsule (300 mg total) by mouth at bedtime. 08/22/18  Yes Mikhail, Velta Addison, DO  hydrALAZINE (APRESOLINE) 25 MG tablet Take 1 tablet (25 mg total) by mouth every 8 (eight) hours. 06/26/18  Yes Thurnell Lose, MD  HYDROmorphone (DILAUDID) 2 MG tablet Take 2-4 mg by mouth every 4 (four) hours.    Yes [provider]  OXYGEN Inhale 2 L into the lungs at bedtime.    Yes [provider]  pantoprazole (PROTONIX) 40 MG tablet Take 1 tablet (40 mg total) by mouth daily. 08/03/18  Yes Patrecia Pour, MD  promethazine (PHENERGAN) 12.5 MG tablet Take 12.5 mg by mouth every 4 (four) hours as needed for nausea or vomiting.   Yes [provider]  traZODone (DESYREL) 100 MG tablet Take 150 mg by mouth at bedtime.  04/19/19  Yes [provider]    Physical Exam: Vitals:   10/16/19 1646 10/16/19 1647  BP: (!) 131/43   Pulse: 70   Resp: 16   Temp: 98.4 F (36.9 C)   TempSrc: Oral   SpO2: 97%   Weight:  44 kg  Height:  5\' 5"  (1.651 m)     Vitals:   10/16/19 1646 10/16/19 1647  BP: (!) 131/43   Pulse: 70   Resp: 16   Temp: 98.4 F (36.9 C)   TempSrc: Oral   SpO2: 97%   Weight:  44 kg  Height:  5\' 5"  (1.651 m)    Constitutional: NAD, alert and oriented x 3 Eyes: PERRL, lids and conjunctivae normal ENMT: Mucous membranes are moist.  Neck: normal, supple, no masses, no thyromegaly Respiratory: clear to auscultation bilaterally, no wheezing, no crackles. Normal respiratory effort. No accessory muscle use.  Cardiovascular: Regular rate and rhythm, no murmurs / rubs / gallops. No extremity edema. 2+ pedal pulses. No carotid bruits.  Abdomen: tender in epigastrium, no masses palpated. No hepatosplenomegaly. Bowel sounds positive.  Musculoskeletal: no clubbing  / cyanosis. No joint deformity upper and lower extremities.  Skin: no rashes, lesions, ulcers.  Neurologic: No gross focal neurologic deficit. Psychiatric: Normal mood and affect.   Labs on Admission: I have personally reviewed following labs and imaging studies  CBC: Recent Labs  Lab 10/16/19 1652  WBC 16.0*  HGB 10.6*  HCT 32.4*  MCV 102.2*  PLT 563   Basic Metabolic Panel: Recent Labs  Lab 10/16/19 1652  NA 134*  K 4.5  CL 96*  CO2 24  GLUCOSE 127*  BUN 26*  CREATININE 3.75*  CALCIUM 8.1*   GFR: Estimated Creatinine Clearance: 10.3 mL/min (A) (by C-G formula based on SCr of 3.75 mg/dL (H)). Liver Function Tests: Recent Labs  Lab 10/16/19 1652  AST 20  ALT 15  ALKPHOS 152*  BILITOT 0.9  PROT 5.2*  ALBUMIN 2.4*   Recent Labs  Lab 10/16/19 1652  LIPASE 23   No results for input(s): AMMONIA in the last 168 hours. Coagulation Profile: No results for input(s): INR, PROTIME in the last 168 hours. Cardiac Enzymes: No results for input(s): CKTOTAL, CKMB, CKMBINDEX, TROPONINI in the last 168 hours. BNP (last 3 results) No results for input(s): PROBNP in the last 8760 hours. HbA1C: No results for input(s): HGBA1C in the last 72 hours. CBG: No results for input(s): GLUCAP in the last 168 hours. Lipid Profile: No results for input(s): CHOL, HDL, LDLCALC, TRIG, CHOLHDL, LDLDIRECT in the last 72 hours. Thyroid Function Tests: No results for input(s): TSH, T4TOTAL, FREET4, T3FREE, THYROIDAB in the last 72 hours.  Anemia Panel: No results for input(s): VITAMINB12, FOLATE, FERRITIN, TIBC, IRON, RETICCTPCT in the last 72 hours. Urine analysis:    Component Value Date/Time   COLORURINE YELLOW (A) 10/16/2019 1652   APPEARANCEUR CLEAR (A) 10/16/2019 1652   LABSPEC 1.017 10/16/2019 1652   PHURINE 5.0 10/16/2019 1652   GLUCOSEU NEGATIVE 10/16/2019 1652   HGBUR NEGATIVE 10/16/2019 1652   BILIRUBINUR NEGATIVE 10/16/2019 1652   KETONESUR NEGATIVE 10/16/2019 1652    PROTEINUR NEGATIVE 10/16/2019 1652   UROBILINOGEN 1.0 09/28/2018 1934   NITRITE NEGATIVE 10/16/2019 1652   LEUKOCYTESUR NEGATIVE 10/16/2019 1652    Radiological Exams on Admission: CT ABDOMEN PELVIS WO CONTRAST  Result Date: 10/16/2019 CLINICAL DATA:  Abdominal pain EXAM: CT ABDOMEN AND PELVIS WITHOUT CONTRAST TECHNIQUE: Multidetector CT imaging of the abdomen and pelvis was performed following the standard protocol without IV contrast. COMPARISON:  July 27, 2019 FINDINGS: Lower chest: The visualized heart size within normal limits. No pericardial fluid/thickening. No hiatal hernia. Streaky atelectasis seen at the posterior right lung base. Hepatobiliary: Although limited due to the lack of intravenous contrast, normal in appearance without gross focal abnormality. The patient is status post cholecystectomy. There is stable mildly dilated common bile duct. No common bile duct stones however are seen. Pancreas: There is fat stranding changes and enlargement seen of the pancreatic head with diffuse coarse calcifications seen within the pancreatic head and proximal body. There is also resultant surrounding inflammatory changes and wall thickening seen within the first and second portion of the duodenum. No loculated fluid collection is seen. No pneumoperitoneum. Spleen: Normal in size. Although limited due to the lack of intravenous contrast, normal in appearance. Adrenals/Urinary Tract: Both adrenal glands appear normal. Multiple bilateral calcifications are seen within the renal hilum, likely vascular and possible punctate renal calculi in the lower poles. No hydronephrosis. No ureteral or bladder calculi are noted. Stomach/Bowel: The stomach Abner major of the small bowel are normal appearance. There is been a prior surgical anastomosis within the sigmoid rectal junction. Scattered colonic diverticula are noted without diverticulitis. Vascular/Lymphatic: There are no enlarged abdominal or pelvic lymph  nodes. The patient is status post aorta bi-iliac stent graft. There is unchanged native aneurysm sac measuring 3.9 cm. There is also a bilateral renal arterial stents at the ostia. Dense vascular calcifications are seen throughout. Reproductive: The patient is status post hysterectomy. No adnexal masses or collections seen. Other: No evidence of abdominal wall mass or hernia. Musculoskeletal: No acute or significant osseous findings. 6 IMPRESSION: 1. Findings consistent with acute on chronic pancreatitis involving pancreatic head and proximal body. There is adjacent resultant duodenitis. No pneumoperitoneum or loculated fluid collections seen. 2. Aorta bi-iliac stent graft with unchanged native aneurysm sac size measuring 3.9 cm. 3. Diverticulosis without diverticulitis. Electronically Signed   By: Prudencio Pair M.D.   On: 10/16/2019 19:21    EKG: Independently reviewed.   Assessment/Plan Principal Problem:   Acute on chronic pancreatitis (HCC)   Recurrent pancreatitis   Chronic pain -Lipase was normal -CT abdomen and pelvis showed findings consistent with acute on chronic pancreatitis involving pancreatic head and proximal body with adjacent resultant duodenitis. Limited IV hydration due to the ESRD on HD -IV Dilaudid, IV antiemetics and supportive care -IV Protonix -Keep n.p.o.  Active Problems: Diarrheax 2 weeks -Stool studies       ESRD on hemodialysis [facility]) -Patient missed dialysis session on the day of arrival but stable and not in need of urgent dialysis based on symptoms and lab work -Nephrology  consult for continuation of dialysis    Essential hypertension -Hold home antihypertensives for now.  Patient normotensive at this time at 131/43    Atrial fibrillation ([place]) -Can resume Coreg after resolution of vomiting -Metoprolol IV as needed -Not currently on anticoagulation    S/P CABG x 3   AAA (abdominal aortic aneurysm) without rupture (HCC)   Chronic diastolic congestive  heart failure (HCC) -No acute concerns for above -Can resume home meds once tolerating orally. -Monitor for fluid overload in view of IV hydration and dialysis status    Type II diabetes mellitus with renal manifestations (HCC) -Sliding scale insulin coverage    DVT prophylaxis: lovenoxCode Status: full code  Family Communication: none  Disposition Plan: Back to previous home environment Consults called: nephrology     Athena Masse MD [facility]     10/16/2019, 9:38 PM

## 2019-10-16 NOTE — ED Provider Notes (Signed)
[facility] Emergency Department Provider Note  ____________________________________________   First MD Initiated Contact with Patient 10/16/19 1739     (approximate)  I have reviewed the triage vital signs and the nursing notes.   HISTORY  Chief Complaint Abdominal Pain    HPI [name] is a 69 y.o. female  With PMhx below including CAD, CHF, ESRD, chronic pancreatitis here with abd pain, nausea, vomiting.  Patient reports her symptoms started approximately 3 days ago.  Her symptoms started as a mild, aching, progressive worsening epigastric pain radiating to her back.  She said associated nausea and generalized weakness.  She has been essentially unable to eat or drink.  She cannot get a dialysis today due to her generalized weakness.  She states pain is worse with any kind of attempted eating or drinking.  No alleviating factors.  Symptoms feel similar to her previous episodes of pancreatitis.  Of note, she recently underwent injection for her pancreatitis, which she feels like did improve the severity of her pain, that is now recently severe.        Past Medical History:  Diagnosis Date  . AAA (abdominal aortic aneurysm) ([place]) 5/09   3.7x3.3 by u/s 2009  . Abnormal EKG    deep TW inversions chronic  . Anemia   . CAD (coronary artery disease)    a. CABG 03/2011 LIMA to LAD, SVG to OM, SVG to PDA. b. cath 06/25/2015 DES to SVG to rPDA, patent LIMA to LAD, patent SVG to OM  . carotid stenosis 5/09   S/p L CEA ;  60-79% bilat ICA by preCABG dopplers 7/12  . CHF (congestive heart failure) ([place])   . Chronic back pain    "all over" (06/24/2015)  . Chronic bronchitis ([place])    "get it pretty much q yr" (06/24/2015)  . CKD (chronic kidney disease)   . Complication of anesthesia 1966   "problem w/ether"  . COPD (chronic obstructive pulmonary disease) ([place])   . Depression   . Dysrhythmia   . GERD (gastroesophageal reflux disease)    With hiatal  hernia  . GIB (gastrointestinal bleeding) 9/11   S/p EGD with cautery at [facility]  . Heart murmur   . History of blood transfusion "a few times"   "all related to anemia" (06/24/2015)  . History of hiatal hernia   . Hyperlipidemia   . Hypertension   . Mitral regurgitation    3+ MR by intraoperative TEE;  s/p MV repair with Dr. Roxy Manns 7/12  . Myocardial infarction [facility]) 2012 "several"  . On home oxygen therapy    "2 liters at night; negative for sleep apnea"  . PAD (peripheral artery disease) (HCC)    Severe; s/p bilateral renal artery stents, moderate in-stent restenosis  . Pancreatitis 05/2018  . Paroxysmal atrial fibrillation (HCC)    coumadin;  echo 9/07: EF 60%, mild LVH;  s/p Cox Maze 7/12 with LAA clipping  . Subclavian artery stenosis, left (HCC)    stented by Dr. Trula Slade on 10/18 to help flow of her LIMA to LAD  . Type II diabetes mellitus [facility])     Patient Active Problem List   Diagnosis Date Noted  . Acute pancreatitis 07/26/2019  . Acute on chronic pancreatitis ([place]) 05/12/2019  . Abnormal CT of the abdomen 09/25/2018  . Pancreatic divisum 09/25/2018  . Pancreatic mass   . ESRD (end stage renal disease) ([place]) 09/09/2018  . Renal failure (ARF), acute on chronic (HCC) 08/15/2018  .  ARF (acute renal failure) ([place]) 08/15/2018  . CKD (chronic kidney disease), stage V ([place]) 08/15/2018  . Hypomagnesemia 08/15/2018  . Acute pulmonary edema ([place]) 08/14/2018  . Goals of care, counseling/discussion   . DNR (do not resuscitate) discussion   . AKI (acute kidney injury) ([place]) 07/26/2018  . Acute encephalopathy 07/24/2018  . Chronic pain 07/24/2018  . Malnutrition of moderate degree 07/13/2018  . Stage II pressure ulcer ([place]) 07/12/2018  . Fluid collection at surgical site   . Intraabdominal fluid collection   . Cystic duct calculus   . RUQ pain 07/08/2018  . Peripancreatic fluid collection 07/04/2018  . 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 Urinary retention 07/04/2018  . S/P  insertion of iliac artery stent 07/04/2018  . Endoleak post (EVAR) endovascular aneurysm repair ([place]) 07/04/2018  . Physical deconditioning   . Esophageal ulcer with bleeding   . Pressure injury of skin 06/16/2018  . Hematemesis   . Palliative care patient   . Palliative care by specialist   . Anasarca 06/07/2018  . Demand ischemia ([place]) 06/07/2018  . Protein-calorie malnutrition, severe 05/29/2018  . Type II diabetes mellitus with renal manifestations ([place]) 05/28/2018  . Recurrent pancreatitis 05/28/2018  . Hypokalemia 05/28/2018  . SIRS (systemic inflammatory response syndrome) ([place]) 05/28/2018  . Acute recurrent pancreatitis 05/28/2018  . Volume overload 04/17/2018  . Anemia due to GI blood loss 04/14/2018  . Acute gallstone pancreatitis 04/14/2018  . Acute cholecystitis 04/14/2018  . Gastrointestinal hemorrhage   . Fatigue associated with anemia 04/09/2018  . Hypoglycemia 01/19/2018  . Type II diabetes mellitus ([place]) 01/19/2018  . Nausea with vomiting 01/19/2018  . Uncontrolled hypertension 01/19/2018  . Hyperlipidemia 01/19/2018  . CAD (coronary artery disease) 01/19/2018  . COPD with hypoxia ([place]) 01/19/2018  . Chronic kidney disease (CKD), stage IV (severe) ([place]) 01/19/2018  . Retroperitoneal hematoma 01/19/2018  . Acute blood loss anemia 01/19/2018  . Chronic diastolic congestive heart failure ([place]) 01/19/2018  . AAA (abdominal aortic aneurysm) ([place]) 01/16/2018  . Elevated brain natriuretic peptide (BNP) level 10/07/2015  . Diabetes mellitus ([place]) 10/07/2015  . Pain in the chest   . PAD (peripheral artery disease) ([place]) 07/29/2015  . Chest pain 07/04/2015  . Acute renal failure superimposed on stage 4 chronic kidney disease ([place]) 07/04/2015  . GERD (gastroesophageal reflux disease) 07/04/2015  . Anemia 06/26/2015  . Coronary artery disease involving autologous vein coronary bypass graft with unstable angina pectoris ([place]) 06/24/2015  . Angina at rest [facility])   .  Subclavian artery stenosis, left ([place]) 06/23/2015  . AAA (abdominal aortic aneurysm) without rupture ([place]) 12/10/2014  . Subclavian steal syndrome 12/10/2014  . Renal artery stenosis ([place]) 09/04/2013  . Carotid artery stenosis 09/28/2012  . Mitral valve disorder 06/23/2011  . S/P CABG x 3 06/16/2011  . S/P Maze operation for atrial fibrillation 06/16/2011  . S/P MVR (mitral valve repair) 06/16/2011  . Follow-up examination following surgery 03/29/2011  . CKD (chronic kidney disease) 03/03/2011  . COPD (chronic obstructive pulmonary disease) ([place]) 03/03/2011  . Chronic diastolic heart failure ([place]) 02/26/2011  . Hypotension 02/26/2011  . NSTEMI (non-ST elevated myocardial infarction) ([place]) 02/26/2011  . Long term (current) use of anticoagulants 12/17/2010  . OBSTRUCTIVE SLEEP APNEA 09/04/2010  . HYPOXEMIA 09/04/2010  . SNORING 08/05/2010  . Atrial fibrillation ([place]) 12/09/2009  . WEIGHT LOSS 03/28/2009  . PALPITATIONS 03/28/2009  . PVD 11/21/2008  . HYPERLIPIDEMIA-MIXED 11/20/2008  . TOBACCO ABUSE 11/20/2008  . Essential hypertension 11/20/2008  . Coronary artery disease involving native  heart with angina pectoris ([place]) 11/20/2008  . ABDOMINAL AORTIC ANEURYSM 11/20/2008    Past Surgical History:  Procedure Laterality Date  . A/V FISTULAGRAM Left 03/21/2018   Procedure: A/V FISTULAGRAM;  Surgeon: Serafina Mitchell, MD;  Location: [facility];  Service: Cardiovascular;  Laterality: Left;  . ABDOMINAL AORTIC ENDOVASCULAR STENT GRAFT N/A 01/17/2018   Procedure: ABDOMINAL AORTIC ENDOVASCULAR STENT GRAFT WITH CO2;  Surgeon: Serafina Mitchell, MD;  Location: [place] [facility];  Service: Vascular;  Laterality: N/A;  . ABDOMINAL HYSTERECTOMY  1990's  . APPENDECTOMY  Aug. 11, 2016   Ruptured  . AV FISTULA PLACEMENT Left 11/03/2017   Procedure: ARTERIOVENOUS (AV) FISTULA CREATION LEFT ARM;  Surgeon: Serafina Mitchell, MD;  Location: [facility];  Service: Vascular;  Laterality: Left;  . BOWEL RESECTION   2013  . CARDIAC CATHETERIZATION N/A 06/24/2015   Procedure: Left Heart Cath and Cors/Grafts Angiography;  Surgeon: Leonie Man, MD;  Location: [place] [facility];  Service: Cardiovascular;  Laterality: N/A;  . CARDIAC CATHETERIZATION N/A 06/25/2015   Procedure: Coronary Stent Intervention;  Surgeon: Sherren Mocha, MD;  Location: [place] [facility];  Service: Cardiovascular;  Laterality: N/A;  . CAROTID ENDARTERECTOMY Left   . CATARACT EXTRACTION Right 03/27/2018  . CHOLECYSTECTOMY N/A 04/19/2018   Procedure: LAPAROSCOPIC CHOLECYSTECTOMY WITH INTRAOPERATIVE CHOLANGIOGRAM;  Surgeon: Rolm Bookbinder, MD;  Location: [facility];  Service: General;  Laterality: N/A;  . CORONARY ANGIOPLASTY    . CORONARY ARTERY BYPASS GRAFT  03/05/2011   CABG X3 (LIMA to LAD, SVG to OM, SVG to PDA, EVH via left thigh  . CORONARY STENT PLACEMENT    . ESOPHAGOGASTRODUODENOSCOPY N/A 04/12/2018   Procedure: ESOPHAGOGASTRODUODENOSCOPY (EGD);  Surgeon: [name], MD;  Location: [facility];  Service: Endoscopy;  Laterality: N/A;  . ESOPHAGOGASTRODUODENOSCOPY N/A 06/16/2018   Procedure: ESOPHAGOGASTRODUODENOSCOPY (EGD);  Surgeon: Thornton Park, MD;  Location: [place];  Service: Gastroenterology;  Laterality: N/A;  . IR RADIOLOGIST EVAL & MGMT  09/13/2019  . LACERATION REPAIR Right 1990's   WRIST  . MAZE  03/05/2011   complete biatrial lesion set with clipping of LA appendage  . MITRAL VALVE REPAIR  03/05/2011   59mm Memo 3D ring annuloplasty for ischemic MR  . PERIPHERAL VASCULAR BALLOON ANGIOPLASTY Left 03/21/2018   Procedure: PERIPHERAL VASCULAR BALLOON ANGIOPLASTY;  Surgeon: Serafina Mitchell, MD;  Location: [place] [facility];  Service: Cardiovascular;  Laterality: Left;  . PERIPHERAL VASCULAR CATHETERIZATION N/A 06/24/2015   Procedure: Aortic Arch Angiography;  Surgeon: Serafina Mitchell, MD;  Location: [place] [facility];  Service: Cardiovascular;  Laterality: N/A;  . PERIPHERAL VASCULAR CATHETERIZATION   06/24/2015   Procedure: Peripheral Vascular Intervention;  Surgeon: Serafina Mitchell, MD;  Location: [place] [facility];  Service: Cardiovascular;;  . PERIPHERAL VASCULAR CATHETERIZATION N/A 06/24/2015   Procedure: Abdominal Aortogram;  Surgeon: Serafina Mitchell, MD;  Location: [place] [facility];  Service: Cardiovascular;  Laterality: N/A;  . RENAL ANGIOGRAM Bilateral 09/11/2013   Procedure: RENAL ANGIOGRAM;  Surgeon: Serafina Mitchell, MD;  Location: [facility] C[facility];  Service: Cardiovascular;  Laterality: Bilateral;  . RIGHT FEMORAL-POPLITEAL BYPASS      Prior to Admission medications   Medication Sig Start Date End Date Taking? Authorizing Provider  Albuterol Sulfate (PROAIR RESPICLICK) 269 (90 Base) MCG/ACT AEPB Inhale 1 puff into the lungs every 4 (four) hours as needed (shortness of breath/wheezing).   Yes [provider]  allopurinol (ZYLOPRIM) 100 MG tablet Take 100 mg by mouth  daily. 02/02/17  Yes [provider]  amLODipine (NORVASC) 5 MG tablet Take 1 tablet (5 mg total) by mouth daily. Patient taking differently: Take 10 mg by mouth daily.  08/23/18  Yes Mikhail, Maryann, DO  atorvastatin (LIPITOR) 40 MG tablet Take 40 mg by mouth daily.  12/07/17  Yes [provider]  B Complex-C-Zn-Folic Acid (DIALYVITE 093-OIZT 15) 0.8 MG TABS Take 1 tablet by mouth daily. 08/09/19  Yes [provider]  carvedilol (COREG) 25 MG tablet Take 25 mg by mouth 2 (two) times daily. 04/16/19  Yes [provider]  clopidogrel (PLAVIX) 75 MG tablet Take 75 mg by mouth daily. 10/02/19  Yes [provider]  CREON 12000 units CPEP capsule Take 2 capsules (24,000 Units total) by mouth 3 (three) times daily before meals. 05/18/19  Yes Regalado, Belkys A, MD  diphenhydrAMINE (BENADRYL) 25 MG tablet Take 50 mg by mouth at bedtime.   Yes [provider]  gabapentin (NEURONTIN) 300 MG capsule Take 1 capsule (300 mg total) by mouth at bedtime. 08/22/18  Yes Mikhail,  Velta Addison, DO  hydrALAZINE (APRESOLINE) 25 MG tablet Take 1 tablet (25 mg total) by mouth every 8 (eight) hours. 06/26/18  Yes Thurnell Lose, MD  HYDROmorphone (DILAUDID) 2 MG tablet Take 2-4 mg by mouth every 4 (four) hours.    Yes [provider]  OXYGEN Inhale 2 L into the lungs at bedtime.    Yes [provider]  pantoprazole (PROTONIX) 40 MG tablet Take 1 tablet (40 mg total) by mouth daily. 08/03/18  Yes Patrecia Pour, MD  promethazine (PHENERGAN) 12.5 MG tablet Take 12.5 mg by mouth every 4 (four) hours as needed for nausea or vomiting.   Yes [provider]  traZODone (DESYREL) 100 MG tablet Take 150 mg by mouth at bedtime.  04/19/19  Yes [provider]    Allergies Isosorbide, Codeine, Contrast media [iodinated diagnostic agents], Ioxaglate, Isosorbide nitrate, Metrizamide, Oxycodone-acetaminophen, and Percocet [oxycodone-acetaminophen]  Family History  Problem Relation Age of Onset  . Emphysema Mother   . Heart disease Mother        before age 77  . Hypertension Mother   . Hyperlipidemia Mother   . Heart attack Mother   . AAA (abdominal aortic aneurysm) Mother        rupture  . Heart attack Father 38  . Emphysema Father   . Heart disease Father        before age 53  . Hyperlipidemia Father   . Hypertension Father   . Peripheral vascular disease Father   . Diabetes Brother   . Heart disease Brother        before age 30  . Hyperlipidemia Brother   . Hypertension Brother   . Heart attack Brother        CABG  . Heart disease Other        Vascular disease in grandparents, uncles and dad  . Colon cancer Neg Hx   . Esophageal cancer Neg Hx   . Inflammatory bowel disease Neg Hx   . Liver disease Neg Hx   . Pancreatic cancer Neg Hx   . Rectal cancer Neg Hx   . Stomach cancer Neg Hx     Social History Social History   Tobacco Use  . Smoking status: Current Every Day Smoker    Packs/day: 0.75    Years: 50.00    Pack years:  37.50    Types: Cigarettes  . Smokeless tobacco: Never  Used  . Tobacco comment: 3/4 pk per day  Substance Use Topics  . Alcohol use: Yes    Alcohol/week: 0.0 standard drinks  . Drug use: Yes    Types: Marijuana    Review of Systems  Review of Systems  Constitutional: Positive for appetite change and fatigue. Negative for fever.  HENT: Negative for congestion and sore throat.   Eyes: Negative for visual disturbance.  Respiratory: Negative for cough and shortness of breath.   Cardiovascular: Negative for chest pain.  Gastrointestinal: Positive for abdominal pain, nausea and vomiting. Negative for diarrhea.  Genitourinary: Negative for flank pain.  Musculoskeletal: Negative for back pain and neck pain.  Skin: Negative for rash and wound.  Neurological: Positive for weakness.     ____________________________________________  PHYSICAL EXAM:      VITAL SIGNS: ED Triage Vitals  Enc Vitals Group     BP 10/16/19 1646 (!) 131/43     Pulse Rate 10/16/19 1646 70     Resp 10/16/19 1646 16     Temp 10/16/19 1646 98.4 F (36.9 C)     Temp Source 10/16/19 1646 Oral     SpO2 10/16/19 1646 97 %     Weight 10/16/19 1647 97 lb (44 kg)     Height 10/16/19 1647 5\' 5"  (1.651 m)     Head Circumference --      Peak Flow --      Pain Score 10/16/19 1646 8     Pain Loc --      Pain Edu? --      Excl. in [place]? --      Physical Exam Vitals and nursing note reviewed.  Constitutional:      General: She is not in acute distress.    Appearance: She is well-developed.     Comments: Chronically ill-appearing  HENT:     Head: Normocephalic and atraumatic.     Mouth/Throat:     Comments: Mildly dry mucous membranes Eyes:     Conjunctiva/sclera: Conjunctivae normal.  Cardiovascular:     Rate and Rhythm: Normal rate and regular rhythm.     Heart sounds: Normal heart sounds.  Pulmonary:     Effort: Pulmonary effort is normal. No respiratory distress.     Breath sounds: No wheezing.    Abdominal:     General: Abdomen is flat. There is no distension.     Tenderness: There is generalized abdominal tenderness and tenderness in the epigastric area. There is guarding. There is no rebound.  Musculoskeletal:     Cervical back: Neck supple.     Comments: Mild tenderness right deltoid from recent injection.  No redness or swelling.  Skin:    General: Skin is warm.     Capillary Refill: Capillary refill takes less than 2 seconds.     Findings: No rash.  Neurological:     Mental Status: She is alert and oriented to person, place, and time.     Motor: No abnormal muscle tone.       ____________________________________________   LABS (all labs ordered are listed, but only abnormal results are displayed)  Labs Reviewed  COMPREHENSIVE METABOLIC PANEL - Abnormal; Notable for the following components:      Result Value   Sodium 134 (*)    Chloride 96 (*)    Glucose, Bld 127 (*)    BUN 26 (*)    Creatinine, Ser 3.75 (*)    Calcium 8.1 (*)    Total Protein 5.2 (*)  Albumin 2.4 (*)    Alkaline Phosphatase 152 (*)    GFR calc non Af Amer 12 (*)    GFR calc Af Amer 14 (*)    All other components within normal limits  CBC - Abnormal; Notable for the following components:   WBC 16.0 (*)    RBC 3.17 (*)    Hemoglobin 10.6 (*)    HCT 32.4 (*)    MCV 102.2 (*)    RDW 15.8 (*)    All other components within normal limits  URINALYSIS, COMPLETE (UACMP) WITH MICROSCOPIC - Abnormal; Notable for the following components:   Color, Urine YELLOW (*)    APPearance CLEAR (*)    All other components within normal limits  SARS CORONAVIRUS 2 (TAT 6-24 HRS)  LIPASE, BLOOD    ____________________________________________  EKG: Sinus rhythm, ventricular rate 69.  Right bundle branch block.  QRS 130, QTc 462.  No acute ST elevations or depressions.  Abnormal T waves noted in lateral and inferior leads, query demand ischemia.  No reciprocal ST  elevations. ________________________________________  RADIOLOGY All imaging, including plain films, CT scans, and ultrasounds, independently reviewed by me, and interpretations confirmed via formal radiology reads.  ED MD interpretation:   CT A/P: Acute on chronic pancreatitis w/o complications  Official radiology report(s): CT ABDOMEN PELVIS WO CONTRAST  Result Date: 10/16/2019 CLINICAL DATA:  Abdominal pain EXAM: CT ABDOMEN AND PELVIS WITHOUT CONTRAST TECHNIQUE: Multidetector CT imaging of the abdomen and pelvis was performed following the standard protocol without IV contrast. COMPARISON:  July 27, 2019 FINDINGS: Lower chest: The visualized heart size within normal limits. No pericardial fluid/thickening. No hiatal hernia. Streaky atelectasis seen at the posterior right lung base. Hepatobiliary: Although limited due to the lack of intravenous contrast, normal in appearance without gross focal abnormality. The patient is status post cholecystectomy. There is stable mildly dilated common bile duct. No common bile duct stones however are seen. Pancreas: There is fat stranding changes and enlargement seen of the pancreatic head with diffuse coarse calcifications seen within the pancreatic head and proximal body. There is also resultant surrounding inflammatory changes and wall thickening seen within the first and second portion of the duodenum. No loculated fluid collection is seen. No pneumoperitoneum. Spleen: Normal in size. Although limited due to the lack of intravenous contrast, normal in appearance. Adrenals/Urinary Tract: Both adrenal glands appear normal. Multiple bilateral calcifications are seen within the renal hilum, likely vascular and possible punctate renal calculi in the lower poles. No hydronephrosis. No ureteral or bladder calculi are noted. Stomach/Bowel: The stomach Abner major of the small bowel are normal appearance. There is been a prior surgical anastomosis within the sigmoid  rectal junction. Scattered colonic diverticula are noted without diverticulitis. Vascular/Lymphatic: There are no enlarged abdominal or pelvic lymph nodes. The patient is status post aorta bi-iliac stent graft. There is unchanged native aneurysm sac measuring 3.9 cm. There is also a bilateral renal arterial stents at the ostia. Dense vascular calcifications are seen throughout. Reproductive: The patient is status post hysterectomy. No adnexal masses or collections seen. Other: No evidence of abdominal wall mass or hernia. Musculoskeletal: No acute or significant osseous findings. 6 IMPRESSION: 1. Findings consistent with acute on chronic pancreatitis involving pancreatic head and proximal body. There is adjacent resultant duodenitis. No pneumoperitoneum or loculated fluid collections seen. 2. Aorta bi-iliac stent graft with unchanged native aneurysm sac size measuring 3.9 cm. 3. Diverticulosis without diverticulitis. Electronically Signed   By: Prudencio Pair M.D.   On:  10/16/2019 19:21    ____________________________________________  PROCEDURES   Procedure(s) performed (including Critical Care):  Procedures  ____________________________________________  INITIAL IMPRESSION / MDM / [place] / ED COURSE  As part of my medical decision making, I reviewed the following data within the [facility] notes reviewed and incorporated, Old chart reviewed, Notes from prior ED visits, and [place] Controlled Substance Database       *[name] was evaluated in Emergency Department on 10/16/2019 for the symptoms described in the history of present illness. She was evaluated in the context of the global COVID-19 pandemic, which necessitated consideration that the patient might be at risk for infection with the SARS-CoV-2 virus that causes COVID-19. Institutional protocols and algorithms that pertain to the evaluation of patients at risk for COVID-19 are in a state of rapid change  based on information released by regulatory bodies including the CDC and federal and state organizations. These policies and algorithms were followed during the patient's care in the ED.  Some ED evaluations and interventions may be delayed as a result of limited staffing during the pandemic.*  Clinical Course as of Oct 16 2023  Tue Feb 09, 837  [id number] 69 year old female with history of recurrent pancreatitis and extensive past medical history including end-stage renal disease here with nausea, vomiting, and abdominal pain.  Symptoms and exam are highly consistent with recurrent acute on chronic pancreatitis.  Unclear trigger.  Denies alcohol use and she is status post cholecystectomy.  Imaging, labs reviewed.  She has moderate leukocytosis which I suspect is reactive to her vomiting.  Lab work otherwise is fairly reassuring, which is likely also reflective of her poor p.o. intake given that she also missed her dialysis on Saturday.  Electrolytes are acceptable.  CT imaging obtained and shows acute on chronic pancreatitis with duodenitis, but no surgical complications.  She remains persistently nauseous and and pain after IV analgesics and antiemetics and has been unable to eat or drink at home.  Will admit.   [CI]    Clinical Course User Index [CI] Duffy Bruce, MD    Medical Decision Making:  As above.  ____________________________________________  FINAL CLINICAL IMPRESSION(S) / ED DIAGNOSES  Final diagnoses:  Acute on chronic pancreatitis ([place])     MEDICATIONS GIVEN DURING THIS VISIT:  Medications  pantoprazole (PROTONIX) injection 40 mg (has no administration in time range)  ondansetron (ZOFRAN) injection 4 mg (4 mg Intravenous Given 10/16/19 1927)  HYDROmorphone (DILAUDID) injection 1 mg (1 mg Intravenous Given 10/16/19 1927)     ED Discharge Orders    None       Note:  This document was prepared using Dragon voice recognition software and may include unintentional dictation  errors.   Duffy Bruce, MD 10/16/19 2025

## 2019-10-16 NOTE — ED Triage Notes (Signed)
Pt in via POV, reports abdominal pain w/ N/V/D x 4 days.  Reports hx of chronic pancreatitis.  NAD noted at this time.

## 2019-10-16 NOTE — ED Triage Notes (Signed)
FIRST NURSE NOTE- here for diarrhea, right arm numbness and abd pain for 1 week.  In wheel chair. NAD at this time.

## 2019-10-17 ENCOUNTER — Encounter: Payer: Self-pay | Admitting: Internal Medicine

## 2019-10-17 ENCOUNTER — Other Ambulatory Visit: Payer: Self-pay

## 2019-10-17 LAB — GLUCOSE, CAPILLARY
Glucose-Capillary: 106 mg/dL — ABNORMAL HIGH (ref 70–99)
Glucose-Capillary: 107 mg/dL — ABNORMAL HIGH (ref 70–99)
Glucose-Capillary: 118 mg/dL — ABNORMAL HIGH (ref 70–99)
Glucose-Capillary: 153 mg/dL — ABNORMAL HIGH (ref 70–99)
Glucose-Capillary: 84 mg/dL (ref 70–99)
Glucose-Capillary: 87 mg/dL (ref 70–99)
Glucose-Capillary: 96 mg/dL (ref 70–99)

## 2019-10-17 LAB — CBC
HCT: 29.6 % — ABNORMAL LOW (ref 36.0–46.0)
Hemoglobin: 9.8 g/dL — ABNORMAL LOW (ref 12.0–15.0)
MCH: 34 pg (ref 26.0–34.0)
MCHC: 33.1 g/dL (ref 30.0–36.0)
MCV: 102.8 fL — ABNORMAL HIGH (ref 80.0–100.0)
Platelets: 198 10*3/uL (ref 150–400)
RBC: 2.88 MIL/uL — ABNORMAL LOW (ref 3.87–5.11)
RDW: 15.8 % — ABNORMAL HIGH (ref 11.5–15.5)
WBC: 11.7 10*3/uL — ABNORMAL HIGH (ref 4.0–10.5)
nRBC: 0 % (ref 0.0–0.2)

## 2019-10-17 LAB — COMPREHENSIVE METABOLIC PANEL
ALT: 14 U/L (ref 0–44)
AST: 18 U/L (ref 15–41)
Albumin: 2.4 g/dL — ABNORMAL LOW (ref 3.5–5.0)
Alkaline Phosphatase: 131 U/L — ABNORMAL HIGH (ref 38–126)
Anion gap: 10 (ref 5–15)
BUN: 33 mg/dL — ABNORMAL HIGH (ref 8–23)
CO2: 26 mmol/L (ref 22–32)
Calcium: 7.9 mg/dL — ABNORMAL LOW (ref 8.9–10.3)
Chloride: 100 mmol/L (ref 98–111)
Creatinine, Ser: 3.61 mg/dL — ABNORMAL HIGH (ref 0.44–1.00)
GFR calc Af Amer: 14 mL/min — ABNORMAL LOW (ref >60)
GFR calc non Af Amer: 12 mL/min — ABNORMAL LOW (ref >60)
Glucose, Bld: 90 mg/dL (ref 70–99)
Potassium: 4.3 mmol/L (ref 3.5–5.1)
Sodium: 136 mmol/L (ref 135–145)
Total Bilirubin: 0.9 mg/dL (ref 0.3–1.2)
Total Protein: 5.2 g/dL — ABNORMAL LOW (ref 6.5–8.1)

## 2019-10-17 LAB — SARS CORONAVIRUS 2 (TAT 6-24 HRS): SARS Coronavirus 2: NEGATIVE

## 2019-10-17 MED ORDER — TRAZODONE HCL 50 MG PO TABS
150.0000 mg | ORAL_TABLET | Freq: Every day | ORAL | Status: DC
  Administered 2019-10-17 – 2019-10-24 (×7): 150 mg via ORAL
  Filled 2019-10-17 (×7): qty 1

## 2019-10-17 MED ORDER — DIPHENHYDRAMINE HCL 25 MG PO CAPS
50.0000 mg | ORAL_CAPSULE | Freq: Every day | ORAL | Status: DC
  Administered 2019-10-17 – 2019-10-24 (×7): 50 mg via ORAL
  Filled 2019-10-17 (×8): qty 2

## 2019-10-17 MED ORDER — ALLOPURINOL 100 MG PO TABS
100.0000 mg | ORAL_TABLET | Freq: Every day | ORAL | Status: DC
  Administered 2019-10-18 – 2019-10-25 (×7): 100 mg via ORAL
  Filled 2019-10-17 (×8): qty 1

## 2019-10-17 MED ORDER — CLOPIDOGREL BISULFATE 75 MG PO TABS
75.0000 mg | ORAL_TABLET | Freq: Every day | ORAL | Status: DC
  Administered 2019-10-17 – 2019-10-25 (×9): 75 mg via ORAL
  Filled 2019-10-17 (×9): qty 1

## 2019-10-17 MED ORDER — DEXTROSE 50 % IV SOLN
12.5000 g | INTRAVENOUS | Status: AC
  Administered 2019-10-17: 12.5 g via INTRAVENOUS
  Filled 2019-10-17: qty 50

## 2019-10-17 MED ORDER — AMLODIPINE BESYLATE 5 MG PO TABS
5.0000 mg | ORAL_TABLET | Freq: Every day | ORAL | Status: DC
  Administered 2019-10-17: 5 mg via ORAL
  Filled 2019-10-17: qty 1

## 2019-10-17 MED ORDER — HYDRALAZINE HCL 25 MG PO TABS
25.0000 mg | ORAL_TABLET | Freq: Three times a day (TID) | ORAL | Status: DC
  Administered 2019-10-17 – 2019-10-25 (×20): 25 mg via ORAL
  Filled 2019-10-17 (×23): qty 1

## 2019-10-17 MED ORDER — ALBUTEROL SULFATE (2.5 MG/3ML) 0.083% IN NEBU: 3.0000 mL | INHALATION_SOLUTION | RESPIRATORY_TRACT | Status: DC | PRN

## 2019-10-17 MED ORDER — RENA-VITE PO TABS
1.0000 | ORAL_TABLET | Freq: Every day | ORAL | Status: DC
  Administered 2019-10-18 – 2019-10-23 (×6): 1 via ORAL
  Filled 2019-10-17 (×9): qty 1

## 2019-10-17 MED ORDER — PANCRELIPASE (LIP-PROT-AMYL) 12000-38000 UNITS PO CPEP
24000.0000 [IU] | ORAL_CAPSULE | Freq: Three times a day (TID) | ORAL | Status: DC
  Administered 2019-10-17 – 2019-10-25 (×17): 24000 [IU] via ORAL
  Filled 2019-10-17 (×26): qty 2

## 2019-10-17 MED ORDER — PROMETHAZINE HCL 25 MG/ML IJ SOLN
6.2500 mg | Freq: Four times a day (QID) | INTRAMUSCULAR | Status: DC | PRN
  Administered 2019-10-17 – 2019-10-23 (×6): 6.25 mg via INTRAVENOUS
  Filled 2019-10-17 (×7): qty 1

## 2019-10-17 MED ORDER — GABAPENTIN 300 MG PO CAPS
300.0000 mg | ORAL_CAPSULE | Freq: Every day | ORAL | Status: DC
  Administered 2019-10-17 – 2019-10-24 (×8): 300 mg via ORAL
  Filled 2019-10-17 (×7): qty 1
  Filled 2019-10-17: qty 3

## 2019-10-17 MED ORDER — BOOST / RESOURCE BREEZE PO LIQD CUSTOM
1.0000 | Freq: Three times a day (TID) | ORAL | Status: DC
  Administered 2019-10-17 – 2019-10-22 (×14): 1 via ORAL

## 2019-10-17 MED ORDER — DEXTROSE-NACL 5-0.9 % IV SOLN: INTRAVENOUS | Status: DC

## 2019-10-17 MED ORDER — CARVEDILOL 25 MG PO TABS
25.0000 mg | ORAL_TABLET | Freq: Two times a day (BID) | ORAL | Status: DC
  Administered 2019-10-17 – 2019-10-24 (×14): 25 mg via ORAL
  Filled 2019-10-17 (×15): qty 1

## 2019-10-17 MED ORDER — ASCORBIC ACID 500 MG PO TABS
500.0000 mg | ORAL_TABLET | Freq: Two times a day (BID) | ORAL | Status: DC
  Administered 2019-10-17 – 2019-10-24 (×12): 500 mg via ORAL
  Filled 2019-10-17 (×11): qty 1

## 2019-10-17 MED ORDER — PRO-STAT SUGAR FREE PO LIQD
30.0000 mL | Freq: Three times a day (TID) | ORAL | Status: DC
  Administered 2019-10-17 – 2019-10-25 (×16): 30 mL via ORAL

## 2019-10-17 NOTE — Progress Notes (Signed)
Pt arrived from ED on stretcher. Was able to ambulate to bed from door with only stand by assistance.  Waiting for orders at this time.

## 2019-10-17 NOTE — Consult Note (Signed)
[address] [place], [place] [address] Phone [phone]. Fax [phone]  Date: 10/17/2019                  Patient Name:  [name]  MRN: [id number]  DOB: [birth year]  Age / Sex: 69 y.o., female         PCP: Bernerd Limbo, MD                 Service Requesting Consult: IM/ Sidney Ace, MD                 Reason for Consult: ESRD            History of Present Illness: Patient is a 69 y.o. female with medical problems of ESRD, on hemodialysis, CAD/CABG, COPD, atrial fibrillation, hypertension, COPD, chronic pancreatitis and chronic abdominal pain requiring recurrent hospitalizations, who was admitted to [facility] on 10/16/2019 for evaluation of abdominal pain, nausea.  Patient also reports loose bowel movements prior to arrival. Nephrology consult has been requested for evaluation of continuation of dialysis Patient reports that she missed her treatment yesterday She denies any shortness of breath Potassium is 4.3 this morning  Medications: Outpatient medications: Medications Prior to Admission  Medication Sig Dispense Refill Last Dose  . Albuterol Sulfate (PROAIR RESPICLICK) 124 (90 Base) MCG/ACT AEPB Inhale 1 puff into the lungs every 4 (four) hours as needed (shortness of breath/wheezing).   10/16/2019 at prn  . allopurinol (ZYLOPRIM) 100 MG tablet Take 100 mg by mouth daily.  2 10/16/2019 at Unknown time  . amLODipine (NORVASC) 5 MG tablet Take 1 tablet (5 mg total) by mouth daily. (Patient taking differently: Take 10 mg by mouth daily. ) 30 tablet 0 10/16/2019 at Unknown time  . atorvastatin (LIPITOR) 40 MG tablet Take 40 mg by mouth daily.    10/16/2019 at Unknown time  . carvedilol (COREG) 25 MG tablet Take 25 mg by mouth 2 (two) times daily.   10/16/2019 at Unknown time  . clopidogrel (PLAVIX) 75 MG tablet Take 75 mg by mouth daily.   10/16/2019 at Unknown time  . CREON 12000 units CPEP capsule Take 2 capsules (24,000 Units total) by mouth 3 (three) times daily  before meals. 270 capsule 0 Past Month at Unknown time  . diphenhydrAMINE (BENADRYL) 25 MG tablet Take 50 mg by mouth at bedtime.   10/16/2019 at Unknown time  . gabapentin (NEURONTIN) 300 MG capsule Take 1 capsule (300 mg total) by mouth at bedtime. 30 capsule 0 10/16/2019 at Unknown time  . hydrALAZINE (APRESOLINE) 25 MG tablet Take 1 tablet (25 mg total) by mouth every 8 (eight) hours.   10/16/2019 at Unknown time  . HYDROmorphone (DILAUDID) 2 MG tablet Take 2-4 mg by mouth every 4 (four) hours.    10/17/2019 at Unknown time  . OXYGEN Inhale 2 L into the lungs at bedtime.    10/17/2019 at Unknown time  . pantoprazole (PROTONIX) 40 MG tablet Take 1 tablet (40 mg total) by mouth daily. 30 tablet 0 10/16/2019 at Unknown time  . promethazine (PHENERGAN) 12.5 MG tablet Take 12.5 mg by mouth every 4 (four) hours as needed for nausea or vomiting.   10/17/2019 at prn  . traZODone (DESYREL) 100 MG tablet Take 150 mg by mouth at bedtime.    10/16/2019 at Unknown time  . B Complex-C-Zn-Folic Acid (DIALYVITE 580-DXIP 15) 0.8 MG TABS Take 1 tablet by mouth daily.   Not Taking at Unknown time    Current  medications: Current Facility-Administered Medications  Medication Dose Route Frequency Provider Last Rate Last Admin  . heparin injection 5,000 Units  5,000 Units Subcutaneous Q8H Athena Masse, MD   5,000 Units at 10/17/19 [phone]  . HYDROmorphone (DILAUDID) injection 1 mg  1 mg Intravenous Q2H PRN Athena Masse, MD   1 mg at 10/17/19 5809  . insulin aspart (novoLOG) injection 0-9 Units  0-9 Units Subcutaneous Q4H Judd Gaudier V, MD      . ondansetron [facility]) tablet 4 mg  4 mg Oral Q6H PRN Athena Masse, MD       Or  . ondansetron [facility]) injection 4 mg  4 mg Intravenous Q6H PRN Athena Masse, MD      . promethazine (PHENERGAN) injection 6.25 mg  6.25 mg Intravenous Q6H PRN Athena Masse, MD   6.25 mg at 10/17/19 9833      Allergies: Allergies  Allergen Reactions  . Isosorbide Other (See Comments)     Can only tolerate in low doses (unknown reaction) Other reaction(s): Other Unknown "FELT LIKE I WAS GOING TO EXPLODE FROM INSIDE OUT"  . Codeine Itching  . Contrast Media [Iodinated Diagnostic Agents] Itching and Other (See Comments)    Itching of feet  . Ioxaglate Itching and Other (See Comments)    Itching of feet  . Isosorbide Nitrate Other (See Comments)    Unknown   . Metrizamide Itching and Other (See Comments)    Itching of feet  . Oxycodone-Acetaminophen Itching  . Percocet [Oxycodone-Acetaminophen] Itching and Other (See Comments)    Tolerates Hydrocodone      Past Medical History: Past Medical History:  Diagnosis Date  . AAA (abdominal aortic aneurysm) ([place]) 5/09   3.7x3.3 by u/s 2009  . Abnormal EKG    deep TW inversions chronic  . Anemia   . CAD (coronary artery disease)    a. CABG 03/2011 LIMA to LAD, SVG to OM, SVG to PDA. b. cath 06/25/2015 DES to SVG to rPDA, patent LIMA to LAD, patent SVG to OM  . carotid stenosis 5/09   S/p L CEA ;  60-79% bilat ICA by preCABG dopplers 7/12  . CHF (congestive heart failure) ([place])   . Chronic back pain    "all over" (06/24/2015)  . Chronic bronchitis ([place])    "get it pretty much q yr" (06/24/2015)  . CKD (chronic kidney disease)   . Complication of anesthesia 1966   "problem w/ether"  . COPD (chronic obstructive pulmonary disease) ([place])   . Depression   . Dysrhythmia   . GERD (gastroesophageal reflux disease)    With hiatal hernia  . GIB (gastrointestinal bleeding) 9/11   S/p EGD with cautery at [facility]  . Heart murmur   . History of blood transfusion "a few times"   "all related to anemia" (06/24/2015)  . History of hiatal hernia   . Hyperlipidemia   . Hypertension   . Mitral regurgitation    3+ MR by intraoperative TEE;  s/p MV repair with Dr. Roxy Manns 7/12  . Myocardial infarction [facility]) 2012 "several"  . On home oxygen therapy    "2 liters at night; negative for sleep apnea"  . PAD (peripheral artery disease) ([facility])     Severe; s/p bilateral renal artery stents, moderate in-stent restenosis  . Pancreatitis 05/2018  . Paroxysmal atrial fibrillation ([facility])    coumadin;  echo 9/07: EF 60%, mild LVH;  s/p Cox Maze 7/12 with LAA clipping  . Subclavian artery stenosis, left (  [place])    stented by Dr. Trula Slade on 10/18 to help flow of her LIMA to LAD  . Type II diabetes mellitus ([place])      Past Surgical History: Past Surgical History:  Procedure Laterality Date  . A/V FISTULAGRAM Left 03/21/2018   Procedure: A/V FISTULAGRAM;  Surgeon: Serafina Mitchell, MD;  Location: [place] [facility];  Service: Cardiovascular;  Laterality: Left;  . ABDOMINAL AORTIC ENDOVASCULAR STENT GRAFT N/A 01/17/2018   Procedure: ABDOMINAL AORTIC ENDOVASCULAR STENT GRAFT WITH CO2;  Surgeon: Serafina Mitchell, MD;  Location: [place];  Service: Vascular;  Laterality: N/A;  . ABDOMINAL HYSTERECTOMY  1990's  . APPENDECTOMY  Aug. 11, 2016   Ruptured  . AV FISTULA PLACEMENT Left 11/03/2017   Procedure: ARTERIOVENOUS (AV) FISTULA CREATION LEFT ARM;  Surgeon: Serafina Mitchell, MD;  Location: [place];  Service: Vascular;  Laterality: Left;  . BOWEL RESECTION  2013  . CARDIAC CATHETERIZATION N/A 06/24/2015   Procedure: Left Heart Cath and Cors/Grafts Angiography;  Surgeon: Leonie Man, MD;  Location: [place] [facility];  Service: Cardiovascular;  Laterality: N/A;  . CARDIAC CATHETERIZATION N/A 06/25/2015   Procedure: Coronary Stent Intervention;  Surgeon: Sherren Mocha, MD;  Location: [place] [facility];  Service: Cardiovascular;  Laterality: N/A;  . CAROTID ENDARTERECTOMY Left   . CATARACT EXTRACTION Right 03/27/2018  . CHOLECYSTECTOMY N/A 04/19/2018   Procedure: LAPAROSCOPIC CHOLECYSTECTOMY WITH INTRAOPERATIVE CHOLANGIOGRAM;  Surgeon: Rolm Bookbinder, MD;  Location: [place];  Service: General;  Laterality: N/A;  . CORONARY ANGIOPLASTY    . CORONARY ARTERY BYPASS GRAFT  03/05/2011   CABG X3 (LIMA to LAD, SVG to OM, SVG to PDA, EVH via left thigh   . CORONARY STENT PLACEMENT    . ESOPHAGOGASTRODUODENOSCOPY N/A 04/12/2018   Procedure: ESOPHAGOGASTRODUODENOSCOPY (EGD);  Surgeon: [name], MD;  Location: [facility];  Service: Endoscopy;  Laterality: N/A;  . ESOPHAGOGASTRODUODENOSCOPY N/A 06/16/2018   Procedure: ESOPHAGOGASTRODUODENOSCOPY (EGD);  Surgeon: Thornton Park, MD;  Location: [place];  Service: Gastroenterology;  Laterality: N/A;  . IR RADIOLOGIST EVAL & MGMT  09/13/2019  . LACERATION REPAIR Right 1990's   WRIST  . MAZE  03/05/2011   complete biatrial lesion set with clipping of LA appendage  . MITRAL VALVE REPAIR  03/05/2011   34mm Memo 3D ring annuloplasty for ischemic MR  . PERIPHERAL VASCULAR BALLOON ANGIOPLASTY Left 03/21/2018   Procedure: PERIPHERAL VASCULAR BALLOON ANGIOPLASTY;  Surgeon: Serafina Mitchell, MD;  Location: [place] [facility];  Service: Cardiovascular;  Laterality: Left;  . PERIPHERAL VASCULAR CATHETERIZATION N/A 06/24/2015   Procedure: Aortic Arch Angiography;  Surgeon: Serafina Mitchell, MD;  Location: [place] [facility];  Service: Cardiovascular;  Laterality: N/A;  . PERIPHERAL VASCULAR CATHETERIZATION  06/24/2015   Procedure: Peripheral Vascular Intervention;  Surgeon: Serafina Mitchell, MD;  Location: [place] [facility];  Service: Cardiovascular;;  . PERIPHERAL VASCULAR CATHETERIZATION N/A 06/24/2015   Procedure: Abdominal Aortogram;  Surgeon: Serafina Mitchell, MD;  Location: [place] [facility];  Service: Cardiovascular;  Laterality: N/A;  . RENAL ANGIOGRAM Bilateral 09/11/2013   Procedure: RENAL ANGIOGRAM;  Surgeon: Serafina Mitchell, MD;  Location: [facility] CATH LAB;  Service: Cardiovascular;  Laterality: Bilateral;  . RIGHT FEMORAL-POPLITEAL BYPASS       Family History: Family History  Problem Relation Age of Onset  . Emphysema Mother   . Heart disease Mother        before age 58  . Hypertension Mother   . Hyperlipidemia Mother   .  Heart attack Mother   . AAA (abdominal aortic aneurysm) Mother         rupture  . Heart attack Father 55  . Emphysema Father   . Heart disease Father        before age 15  . Hyperlipidemia Father   . Hypertension Father   . Peripheral vascular disease Father   . Diabetes Brother   . Heart disease Brother        before age 96  . Hyperlipidemia Brother   . Hypertension Brother   . Heart attack Brother        CABG  . Heart disease Other        Vascular disease in grandparents, uncles and dad  . Colon cancer Neg Hx   . Esophageal cancer Neg Hx   . Inflammatory bowel disease Neg Hx   . Liver disease Neg Hx   . Pancreatic cancer Neg Hx   . Rectal cancer Neg Hx   . Stomach cancer Neg Hx      Social History: Social History   Socioeconomic History  . Marital status: Widowed    Spouse name: Not on file  . Number of children: Not on file  . Years of education: Not on file  . Highest education level: Not on file  Occupational History  . Occupation: Scientist, water quality in past    Employer: DISABLED  Tobacco Use  . Smoking status: Current Every Day Smoker    Packs/day: 0.75    Years: 50.00    Pack years: 37.50    Types: Cigarettes  . Smokeless tobacco: Never Used  . Tobacco comment: 3/4 pk per day  Substance and Sexual Activity  . Alcohol use: Yes    Alcohol/week: 0.0 standard drinks  . Drug use: Yes    Types: Marijuana  . Sexual activity: Not Currently    Birth control/protection: None  Other Topics Concern  . Not on file  Social History Narrative   Widowed in 2009.   Social Determinants of Health   Financial Resource Strain:   . Difficulty of Paying Living Expenses: Not on file  Food Insecurity:   . Worried About Charity fundraiser in the Last Year: Not on file  . Ran Out of Food in the Last Year: Not on file  Transportation Needs:   . Lack of Transportation (Medical): Not on file  . Lack of Transportation (Non-Medical): Not on file  Physical Activity:   . Days of Exercise per Week: Not on file  . Minutes of Exercise per Session:  Not on file  Stress:   . Feeling of Stress : Not on file  Social Connections:   . Frequency of Communication with Friends and Family: Not on file  . Frequency of Social Gatherings with Friends and Family: Not on file  . Attends Religious Services: Not on file  . Active Member of Clubs or Organizations: Not on file  . Attends Archivist Meetings: Not on file  . Marital Status: Not on file  Intimate Partner Violence:   . Fear of Current or Ex-Partner: Not on file  . Emotionally Abused: Not on file  . Physically Abused: Not on file  . Sexually Abused: Not on file     Review of Systems: Gen: No fevers or chills at present HEENT: No vision or hearing complaints CV: No chest pain or shortness of breath at present Resp: No cough or sputum production GI: Reports nausea, upper epigastric pain, tolerating clears GU :  Denies dysuria.  Patient is nonoliguric. MS: States she is ambulatory at home Derm: No complaints  Psych: No complaints Heme: no complaints Neuro: No complaints Endocrine.  No complaints  Vital Signs: Blood pressure (!) 146/44, pulse 60, temperature 97.9 F (36.6 C), temperature source Oral, resp. rate 18, height 5\' 5"  (1.651 m), weight 44 kg, SpO2 100 %.   Intake/Output Summary (Last 24 hours) at 10/17/2019 1135 Last data filed at 10/17/2019 0500 Gross per 24 hour  Intake 76.92 ml  Output --  Net 76.92 ml    Weight trends: [employer]   10/16/19 1647 10/17/19 0024  Weight: 44 kg 44 kg    Physical Exam: General:  Thin, frail, elderly female, laying in the bed  HEENT  anicteric, moist oral mucous membranes  Neck:  Supple, no mass  Lungs:  normal breathing effort, clear to auscultation  Heart::  No rub or gallop  Abdomen:  Soft, mild midepigastric tenderness  Extremities:  No peripheral edema  Neurologic:  Alert, oriented, able to answer questions  Skin:  Warm, dry  Access:  Left arm AV fistula    Lab results: Basic Metabolic Panel: Recent  Labs  Lab 10/16/19 1652 10/17/19 0250  NA 134* 136  K 4.5 4.3  CL 96* 100  CO2 24 26  GLUCOSE 127* 90  BUN 26* 33*  CREATININE 3.75* 3.61*  CALCIUM 8.1* 7.9*    Liver Function Tests: Recent Labs  Lab 10/17/19 0250  AST 18  ALT 14  ALKPHOS 131*  BILITOT 0.9  PROT 5.2*  ALBUMIN 2.4*   Recent Labs  Lab 10/16/19 1652  LIPASE 23   No results for input(s): AMMONIA in the last 168 hours.  CBC: Recent Labs  Lab 10/16/19 1652 10/17/19 0250  WBC 16.0* 11.7*  HGB 10.6* 9.8*  HCT 32.4* 29.6*  MCV 102.2* 102.8*  PLT 219 198    Cardiac Enzymes: No results for input(s): CKTOTAL, TROPONINI in the last 168 hours.  BNP: Invalid input(s): POCBNP  CBG: Recent Labs  Lab 10/16/19 2236 10/17/19 0403 10/17/19 0511 10/17/19 0817  GLUCAP 93 84 87 96    Microbiology: Recent Results (from the past 720 hour(s))  SARS CORONAVIRUS 2 (TAT 6-24 HRS) Nasopharyngeal Nasopharyngeal Swab     Status: None   Collection Time: 10/16/19  9:18 PM   Specimen: Nasopharyngeal Swab  Result Value Ref Range Status   SARS Coronavirus 2 NEGATIVE NEGATIVE Final    Comment: (NOTE) SARS-CoV-2 target nucleic acids are NOT DETECTED. The SARS-CoV-2 RNA is generally detectable in upper and lower respiratory specimens during the acute phase of infection. Negative results do not preclude SARS-CoV-2 infection, do not rule out co-infections with other pathogens, and should not be used as the sole basis for treatment or other patient management decisions. Negative results must be combined with clinical observations, patient history, and epidemiological information. The expected result is Negative. Fact Sheet for Patients: SugarRoll.be Fact Sheet for Healthcare Providers: https://www.woods-mathews.com/ This test is not yet approved or cleared by the [place] FDA and  has been authorized for detection and/or diagnosis of SARS-CoV-2 by FDA under an  Emergency Use Authorization (EUA). This EUA will remain  in effect (meaning this test can be used) for the duration of the COVID-19 declaration under Section 56 4(b)(1) of the Act, 21 U.S.C. section 360bbb-3(b)(1), unless the authorization is terminated or revoked sooner. Performed at [facility], [place] [address], [place], [place] [address]      Coagulation Studies: No  results for input(s): LABPROT, INR in the last 72 hours.  Urinalysis: Recent Labs    10/16/19 1652  COLORURINE YELLOW*  LABSPEC 1.017  PHURINE 5.0  GLUCOSEU NEGATIVE  HGBUR NEGATIVE  BILIRUBINUR NEGATIVE  KETONESUR NEGATIVE  PROTEINUR NEGATIVE  NITRITE NEGATIVE  LEUKOCYTESUR NEGATIVE        Imaging: CT ABDOMEN PELVIS WO CONTRAST  Result Date: 10/16/2019 CLINICAL DATA:  Abdominal pain EXAM: CT ABDOMEN AND PELVIS WITHOUT CONTRAST TECHNIQUE: Multidetector CT imaging of the abdomen and pelvis was performed following the standard protocol without IV contrast. COMPARISON:  July 27, 2019 FINDINGS: Lower chest: The visualized heart size within normal limits. No pericardial fluid/thickening. No hiatal hernia. Streaky atelectasis seen at the posterior right lung base. Hepatobiliary: Although limited due to the lack of intravenous contrast, normal in appearance without gross focal abnormality. The patient is status post cholecystectomy. There is stable mildly dilated common bile duct. No common bile duct stones however are seen. Pancreas: There is fat stranding changes and enlargement seen of the pancreatic head with diffuse coarse calcifications seen within the pancreatic head and proximal body. There is also resultant surrounding inflammatory changes and wall thickening seen within the first and second portion of the duodenum. No loculated fluid collection is seen. No pneumoperitoneum. Spleen: Normal in size. Although limited due to the lack of intravenous contrast, normal in appearance. Adrenals/Urinary Tract: Both  adrenal glands appear normal. Multiple bilateral calcifications are seen within the renal hilum, likely vascular and possible punctate renal calculi in the lower poles. No hydronephrosis. No ureteral or bladder calculi are noted. Stomach/Bowel: The stomach Abner major of the small bowel are normal appearance. There is been a prior surgical anastomosis within the sigmoid rectal junction. Scattered colonic diverticula are noted without diverticulitis. Vascular/Lymphatic: There are no enlarged abdominal or pelvic lymph nodes. The patient is status post aorta bi-iliac stent graft. There is unchanged native aneurysm sac measuring 3.9 cm. There is also a bilateral renal arterial stents at the ostia. Dense vascular calcifications are seen throughout. Reproductive: The patient is status post hysterectomy. No adnexal masses or collections seen. Other: No evidence of abdominal wall mass or hernia. Musculoskeletal: No acute or significant osseous findings. 6 IMPRESSION: 1. Findings consistent with acute on chronic pancreatitis involving pancreatic head and proximal body. There is adjacent resultant duodenitis. No pneumoperitoneum or loculated fluid collections seen. 2. Aorta bi-iliac stent graft with unchanged native aneurysm sac size measuring 3.9 cm. 3. Diverticulosis without diverticulitis. Electronically Signed   By: Prudencio Pair M.D.   On: 10/16/2019 19:21      Assessment & Plan: Pt is a 69 y.o. Caucasian   female with  medical problems of ESRD, on hemodialysis, CAD/CABG 03/2011, COPD, atrial fibrillation, hypertension, COPD, chronic pancreatitis and chronic abdominal pain requiring recurrent hospitalizations , h/o AAA, cholecystectomy, severe peripheral arterial disease including bilateral renal artery stent, subclavian artery stenosis, left, left CEA was admitted on 10/16/2019 with abdominal pain, diarrhea Acute pancreatitis [K85.90] Acute on chronic pancreatitis ([place]) [K85.90, K86.1]   [address] [facility]/TTS/left  arm AV fistula/[facility] [place]  #End-stage renal disease Electrolytes and volume status are acceptable.  Patient has some distress from abdominal pain She is tolerating clears but overall oral intake is low Will plan for hemodialysis on her regular day which is tomorrow  #Acute pancreatitis Tolerating clears Management as per hospitalist team  #Anemia of chronic kidney disease Lab Results  Component Value Date   HGB 9.8 (L) 10/17/2019  Will initiate Epogen with hemodialysis  LOS: 1 [place] 2/10/202111:35 AM    Note: This note was prepared with Dragon dictation. Any transcription errors are unintentional

## 2019-10-17 NOTE — Plan of Care (Signed)

## 2019-10-17 NOTE — Progress Notes (Addendum)
PROGRESS NOTE    [name]  WCB:[id number] DOB: 1951-02-16 DOA: 10/16/2019 PCP: Bernerd Limbo, MD    Brief Narrative:  HPI: [name] is a 69 y.o. female with medical history significant for ESRD on HD TTS, CAD status post CABG, COPD, A. fib, hypertension, COPD and chronic pancreatitis with chronic pain on Creon and oral Dilaudid with recurrent hospitalizations for the same, most recently November 2020, who presents to the emergency room with a 3-day history of nausea and abdominal pain typical for her acute pancreatitis flares.  Abdominal pain is mostly in the epigastrium of severe intensity and nonradiating, aggravated by attempting to eat or drink.  She did vomit once on the day of arrival.  Also states she has been having a loose explosive bowel movement daily.  No history of recent antibiotic use.  She denies fever, chills, cough, chest pain and shortness of breath.'  2/10: Patient seen and evaluated.  Abdominal pain still present.  Endorses mild improvement over interval.  Continues to require IV narcotic.  No vomiting noted over interval.  Assessment & Plan:   Principal Problem:   Acute on chronic pancreatitis (HCC) Active Problems:   Essential hypertension   Atrial fibrillation (HCC)   COPD with chronic bronchitis (HCC)   S/P CABG x 3   AAA (abdominal aortic aneurysm) without rupture (HCC)   Chronic diastolic congestive heart failure (HCC)   Type II diabetes mellitus with renal manifestations (HCC)   Recurrent pancreatitis   Chronic pain   ESRD on hemodialysis (HCC)   Acute pancreatitis  Acute on chronic pancreatitis (HCC)   Recurrent pancreatitis   Chronic pain -Lipase was normal -CT abdomen and pelvis showed findings consistent with acute on chronic pancreatitis involving pancreatic head and proximal body with adjacent resultant duodenitis. Plan: Continue management, transition to oral regimen when able Hold IV fluids at this time considering ESRD Advance diet to  clears, encourage p.o. fluid intake IV PPI Anti-emetics  Diarrhea x 2 weeks Suspect chronic diarrhea in the setting of acute on chronic pancreatitis Stool studies sent    ESRD on hemodialysis [facility]) -Patient missed dialysis session on the day of arrival but stable and not in need of urgent dialysis based on symptoms and lab work -Nephrology consult for continuation of dialysis Plan: Nephrology consult appreciated Plan for inpatient hemodialysis tomorrow 2/21    Essential hypertension Nausea improved, can restart home medications Restart Coreg 25 mg twice daily Restart amlodipine 5 mg daily Trazodone, 25 mg every 8 hours     Atrial fibrillation ([place]) Resume carvedilol Metoprolol IV for sustained tachycardia Patient not on prophylactic anticoagulation    S/P CABG x 3   AAA (abdominal aortic aneurysm) without rupture ([place])   Chronic diastolic congestive heart failure ([place]) Resume home meds as above Resume Plavix Monitor volume status Telemetry    Type II diabetes mellitus with renal manifestations ([place]) -Sliding scale insulin coverage   DVT prophylaxis: Subcu heparin Code Status: Full Family Communication: Left voicemail for daughter 10/17/2019 Disposition Plan: Anticipate home after resolution of acute pancreatitis symptoms   Consultants:   Nephrology  Procedures:   None  Antimicrobials:   None   Subjective: Patient seen and examined Abdominal pain still significant however improved arrival Nausea improved with antiemetic regimen  Objective: Vitals:   10/16/19 2300 10/17/19 0000 10/17/19 0024 10/17/19 0815  BP: (!) 147/69 133/76 (!) 155/61 (!) 146/44  Pulse: 60 60 67 60  Resp: 14 13 18    Temp:   98.4 F (  36.9 C) 97.9 F (36.6 C)  TempSrc:   Oral Oral  SpO2: 100% 100% 100% 100%  Weight:   44 kg   Height:   5\' 5"  (1.651 m)     Intake/Output Summary (Last 24 hours) at 10/17/2019 1211 Last data filed at 10/17/2019 0500 Gross per 24 hour   Intake 76.92 ml  Output --  Net 76.92 ml   Filed Weights   10/16/19 1647 10/17/19 0024  Weight: 44 kg 44 kg    Examination:  General exam: Appears calm and comfortable  Respiratory system: Clear to auscultation. Respiratory effort normal. Cardiovascular system: S1 & S2 heard, RRR. No JVD, murmurs, rubs, gallops or clicks. No pedal edema. Gastrointestinal system: Nondistended, tender to palpation in epigastrium, normal bowel sounds  Central nervous system: Alert and oriented. No focal neurological deficits. Extremities: Symmetric 5 x 5 power. Skin: No rashes, lesions or ulcers Psychiatry: Judgement and insight appear normal. Mood & affect appropriate.     Data Reviewed: I have personally reviewed following labs and imaging studies  CBC: Recent Labs  Lab 10/16/19 1652 10/17/19 0250  WBC 16.0* 11.7*  HGB 10.6* 9.8*  HCT 32.4* 29.6*  MCV 102.2* 102.8*  PLT 219 009   Basic Metabolic Panel: Recent Labs  Lab 10/16/19 1652 10/17/19 0250  NA 134* 136  K 4.5 4.3  CL 96* 100  CO2 24 26  GLUCOSE 127* 90  BUN 26* 33*  CREATININE 3.75* 3.61*  CALCIUM 8.1* 7.9*   GFR: Estimated Creatinine Clearance: 10.6 mL/min (A) (by C-G formula based on SCr of 3.61 mg/dL (H)). Liver Function Tests: Recent Labs  Lab 10/16/19 1652 10/17/19 0250  AST 20 18  ALT 15 14  ALKPHOS 152* 131*  BILITOT 0.9 0.9  PROT 5.2* 5.2*  ALBUMIN 2.4* 2.4*   Recent Labs  Lab 10/16/19 1652  LIPASE 23   No results for input(s): AMMONIA in the last 168 hours. Coagulation Profile: No results for input(s): INR, PROTIME in the last 168 hours. Cardiac Enzymes: No results for input(s): CKTOTAL, CKMB, CKMBINDEX, TROPONINI in the last 168 hours. BNP (last 3 results) No results for input(s): PROBNP in the last 8760 hours. HbA1C: No results for input(s): HGBA1C in the last 72 hours. CBG: Recent Labs  Lab 10/16/19 2236 10/17/19 0403 10/17/19 0511 10/17/19 0817 10/17/19 1142  GLUCAP 93 84 87 96  107*   Lipid Profile: No results for input(s): CHOL, HDL, LDLCALC, TRIG, CHOLHDL, LDLDIRECT in the last 72 hours. Thyroid Function Tests: No results for input(s): TSH, T4TOTAL, FREET4, T3FREE, THYROIDAB in the last 72 hours. Anemia Panel: No results for input(s): VITAMINB12, FOLATE, FERRITIN, TIBC, IRON, RETICCTPCT in the last 72 hours. Sepsis Labs: No results for input(s): PROCALCITON, LATICACIDVEN in the last 168 hours.  Recent Results (from the past 240 hour(s))  SARS CORONAVIRUS 2 (TAT 6-24 HRS) Nasopharyngeal Nasopharyngeal Swab     Status: None   Collection Time: 10/16/19  9:18 PM   Specimen: Nasopharyngeal Swab  Result Value Ref Range Status   SARS Coronavirus 2 NEGATIVE NEGATIVE Final    Comment: (NOTE) SARS-CoV-2 target nucleic acids are NOT DETECTED. The SARS-CoV-2 RNA is generally detectable in upper and lower respiratory specimens during the acute phase of infection. Negative results do not preclude SARS-CoV-2 infection, do not rule out co-infections with other pathogens, and should not be used as the sole basis for treatment or other patient management decisions. Negative results must be combined with clinical observations, patient history, and epidemiological information. The expected  result is Negative. Fact Sheet for Patients: SugarRoll.be Fact Sheet for Healthcare Providers: https://www.woods-mathews.com/ This test is not yet approved or cleared by the [place] FDA and  has been authorized for detection and/or diagnosis of SARS-CoV-2 by FDA under an Emergency Use Authorization (EUA). This EUA will remain  in effect (meaning this test can be used) for the duration of the COVID-19 declaration under Section 56 4(b)(1) of the Act, 21 U.S.C. section 360bbb-3(b)(1), unless the authorization is terminated or revoked sooner. Performed at [facility], [place] [address], [place], [place] [address]           Radiology Studies: CT ABDOMEN PELVIS WO CONTRAST  Result Date: 10/16/2019 CLINICAL DATA:  Abdominal pain EXAM: CT ABDOMEN AND PELVIS WITHOUT CONTRAST TECHNIQUE: Multidetector CT imaging of the abdomen and pelvis was performed following the standard protocol without IV contrast. COMPARISON:  July 27, 2019 FINDINGS: Lower chest: The visualized heart size within normal limits. No pericardial fluid/thickening. No hiatal hernia. Streaky atelectasis seen at the posterior right lung base. Hepatobiliary: Although limited due to the lack of intravenous contrast, normal in appearance without gross focal abnormality. The patient is status post cholecystectomy. There is stable mildly dilated common bile duct. No common bile duct stones however are seen. Pancreas: There is fat stranding changes and enlargement seen of the pancreatic head with diffuse coarse calcifications seen within the pancreatic head and proximal body. There is also resultant surrounding inflammatory changes and wall thickening seen within the first and second portion of the duodenum. No loculated fluid collection is seen. No pneumoperitoneum. Spleen: Normal in size. Although limited due to the lack of intravenous contrast, normal in appearance. Adrenals/Urinary Tract: Both adrenal glands appear normal. Multiple bilateral calcifications are seen within the renal hilum, likely vascular and possible punctate renal calculi in the lower poles. No hydronephrosis. No ureteral or bladder calculi are noted. Stomach/Bowel: The stomach Abner major of the small bowel are normal appearance. There is been a prior surgical anastomosis within the sigmoid rectal junction. Scattered colonic diverticula are noted without diverticulitis. Vascular/Lymphatic: There are no enlarged abdominal or pelvic lymph nodes. The patient is status post aorta bi-iliac stent graft. There is unchanged native aneurysm sac measuring 3.9 cm. There is also a bilateral renal  arterial stents at the ostia. Dense vascular calcifications are seen throughout. Reproductive: The patient is status post hysterectomy. No adnexal masses or collections seen. Other: No evidence of abdominal wall mass or hernia. Musculoskeletal: No acute or significant osseous findings. 6 IMPRESSION: 1. Findings consistent with acute on chronic pancreatitis involving pancreatic head and proximal body. There is adjacent resultant duodenitis. No pneumoperitoneum or loculated fluid collections seen. 2. Aorta bi-iliac stent graft with unchanged native aneurysm sac size measuring 3.9 cm. 3. Diverticulosis without diverticulitis. Electronically Signed   By: Prudencio Pair M.D.   On: 10/16/2019 19:21        Scheduled Meds: . heparin injection (subcutaneous)  5,000 Units Subcutaneous Q8H  . insulin aspart  0-9 Units Subcutaneous Q4H   Continuous Infusions:   LOS: 1 day    Time spent: 35 minutes    Sidney Ace, MD [facility] Pager [phone]  If 7PM-7AM, please contact night-coverage  10/17/2019, 12:11 PM

## 2019-10-17 NOTE — Progress Notes (Signed)
Initial Nutrition Assessment  DOCUMENTATION CODES:   Severe malnutrition in context of chronic illness  INTERVENTION:   Boost Breeze po TID, each supplement provides 250 kcal and 9 grams of protein  Prostat liquid protein PO 30 ml TID with meals, each supplement provides 100 kcal, 15 grams protein.  Rena-vite daily   Vitamin C 585m po BID  NUTRITION DIAGNOSIS:   Severe Malnutrition related to chronic illness(ESRD on HD, COPD) as evidenced by 17 percent weight loss in < 1 year, severe muscle depletion, severe fat depletion.  GOAL:   Patient will meet greater than or equal to 90% of their needs  MONITOR:   PO intake, Supplement acceptance, Labs, Weight trends, Skin, I & O's  REASON FOR ASSESSMENT:   Malnutrition Screening Tool    ASSESSMENT:   69y.o. female with medical history significant for ESRD on HD TTS, CAD status post CABG X 3, COPD, A. fib, hypertension, COPD, GERD, DM, CHF, AAA and chronic pancreatitis with chronic pain on Creon and oral Dilaudid with recurrent hospitalizations for the same, most recently November 2020, who presents to the emergency room with a 3-day history of nausea and abdominal pain typical for her acute pancreatitis flares.  Met with pt in room today. Pt reports poor appetite and oral intake at baseline but reports her appetite has been severely decreased over the past 2 weeks. Pt reports that she is having a pancreatitis flare and that everything that she eats causes her to have nausea and abdominal cramping. Pt reports that she hasn't been able to eat hardly anything and that she is wasting away. Pt does not drink any supplements at home; pt reports that she does not like milky supplements but that she loves B[employer] Pt also reports that she has been able to take prostat before with no issues. Pt reports taking a renal multivitamin at times when she is feeling well but reports that she often skips this because it has a horrible smell and it  makes her nauseas. Pt does take creon daily. Pt with ecchymosis; highly suspect pt with scurvy r/t chronic HD and poor appetite. Pt also reports that her hair is falling out. Per chart, pt has lost 20lbs(17%) in < 1 year; this is significant. RD will add vitamins and supplements to help pt meet her estimated needs and replace losses from HD. Recommend continue vitamin C after discharge.    Medications reviewed and include: allopurinol, plavix, heparin, insulin, creon, hydromorphone, phenergan   Labs reviewed: BUN 33(H), creat 3.61(H), alk phos 131(H) Wbc- 11.7(H), Hgb 9.8(L), Hct 29.6(L), MCV 102.8(H)  NUTRITION - FOCUSED PHYSICAL EXAM:    Most Recent Value  Orbital Region  Moderate depletion  Upper Arm Region  Severe depletion  Thoracic and Lumbar Region  Severe depletion  Buccal Region  Moderate depletion  Temple Region  Moderate depletion  Clavicle Bone Region  Severe depletion  Clavicle and Acromion Bone Region  Severe depletion  Scapular Bone Region  Severe depletion  Dorsal Hand  Severe depletion  Patellar Region  Severe depletion  Anterior Thigh Region  Severe depletion  Posterior Calf Region  Severe depletion  Edema (RD Assessment)  None  Hair  Reviewed  Eyes  Reviewed  Mouth  Reviewed  Skin  Reviewed  Nails  Reviewed     Diet Order:   Diet Order            Diet clear liquid Room service appropriate? Yes; Fluid consistency: Thin  Diet effective now  EDUCATION NEEDS:   Education needs have been addressed  Skin:  Skin Assessment: Reviewed RN Assessment(ecchymosis)  Last BM:  2/9- type 7  Height:   Ht Readings from Last 1 Encounters:  10/17/19 _0  (1.651 m)    Weight:   Wt Readings from Last 1 Encounters:  10/17/19 44 kg    Ideal Body Weight:  56.8 kg  BMI:  Body mass index is 16.14 kg/m.  Estimated Nutritional Needs:   Kcal:  1400-1600kcal/day  Protein:  70-80g/day  Fluid:  UOP +1L  Koleen Distance MS, RD, LDN Contact  information available in Amion

## 2019-10-18 LAB — GLUCOSE, CAPILLARY
Glucose-Capillary: 119 mg/dL — ABNORMAL HIGH (ref 70–99)
Glucose-Capillary: 97 mg/dL (ref 70–99)
Glucose-Capillary: 120 mg/dL — ABNORMAL HIGH (ref 70–99)
Glucose-Capillary: 195 mg/dL — ABNORMAL HIGH (ref 70–99)

## 2019-10-18 LAB — HEMOGLOBIN A1C
Hgb A1c MFr Bld: 4.9 % (ref 4.8–5.6)
Mean Plasma Glucose: 94 mg/dL

## 2019-10-18 LAB — C DIFFICILE QUICK SCREEN W PCR REFLEX
C Diff antigen: NEGATIVE
C Diff interpretation: NOT DETECTED
C Diff toxin: NEGATIVE

## 2019-10-18 LAB — PHOSPHORUS: Phosphorus: 2.2 mg/dL — ABNORMAL LOW (ref 2.5–4.6)

## 2019-10-18 MED ORDER — LOPERAMIDE HCL 2 MG PO CAPS
2.0000 mg | ORAL_CAPSULE | ORAL | Status: DC | PRN
  Administered 2019-10-18 – 2019-10-19 (×2): 2 mg via ORAL
  Filled 2019-10-18 (×5): qty 1

## 2019-10-18 MED ORDER — AMLODIPINE BESYLATE 5 MG PO TABS
5.0000 mg | ORAL_TABLET | Freq: Every day | ORAL | Status: DC
  Administered 2019-10-18 – 2019-10-20 (×3): 5 mg via ORAL
  Filled 2019-10-18 (×3): qty 1

## 2019-10-18 MED ORDER — EPOETIN ALFA 4000 UNIT/ML IJ SOLN
4000.0000 [IU] | INTRAMUSCULAR | Status: DC
  Administered 2019-10-20 – 2019-10-25 (×3): 4000 [IU] via INTRAVENOUS
  Filled 2019-10-18 (×3): qty 1

## 2019-10-18 MED ORDER — NICOTINE 21 MG/24HR TD PT24
21.0000 mg | MEDICATED_PATCH | Freq: Every day | TRANSDERMAL | Status: DC
  Administered 2019-10-18 – 2019-10-25 (×8): 21 mg via TRANSDERMAL
  Filled 2019-10-18 (×8): qty 1

## 2019-10-18 MED ORDER — CHLORHEXIDINE GLUCONATE CLOTH 2 % EX PADS
6.0000 | MEDICATED_PAD | Freq: Every day | CUTANEOUS | Status: DC
  Administered 2019-10-19 – 2019-10-25 (×5): 6 via TOPICAL

## 2019-10-18 MED ORDER — HEPARIN SODIUM (PORCINE) 1000 UNIT/ML DIALYSIS
20.0000 [IU]/kg | INTRAMUSCULAR | Status: DC | PRN
  Filled 2019-10-18: qty 1

## 2019-10-18 NOTE — Progress Notes (Signed)
PROGRESS NOTE    [name]  WCB:[id number] DOB: May 07, 1951 DOA: 10/16/2019 PCP: Bernerd Limbo, MD    Brief Narrative:  HPI: [name] is a 69 y.o. female with medical history significant for ESRD on HD TTS, CAD status post CABG, COPD, A. fib, hypertension, COPD and chronic pancreatitis with chronic pain on Creon and oral Dilaudid with recurrent hospitalizations for the same, most recently November 2020, who presents to the emergency room with a 3-day history of nausea and abdominal pain typical for her acute pancreatitis flares.  Abdominal pain is mostly in the epigastrium of severe intensity and nonradiating, aggravated by attempting to eat or drink.  She did vomit once on the day of arrival.  Also states she has been having a loose explosive bowel movement daily.  No history of recent antibiotic use.  She denies fever, chills, cough, chest pain and shortness of breath.'  2/10: Patient seen and evaluated.  Abdominal pain still present.  Endorses mild improvement over interval.  Continues to require IV narcotic.  No vomiting noted over interval.  2/11: Patient seen and evaluated.  Still with abdominal pain.  Tolerating small amounts of clear liquids yesterday.  Continues to require IV narcotic.  Nausea improved over interval.  Assessment & Plan:   Principal Problem:   Acute on chronic pancreatitis [facility]) Active Problems:   Essential hypertension   Atrial fibrillation (HCC)   COPD with chronic bronchitis (HCC)   S/P CABG x 3   AAA (abdominal aortic aneurysm) without rupture (HCC)   Chronic diastolic congestive heart failure (HCC)   Type II diabetes mellitus with renal manifestations (HCC)   Recurrent pancreatitis   Chronic pain   ESRD on hemodialysis (HCC)   Acute pancreatitis  Acute on chronic pancreatitis (HCC)   Recurrent pancreatitis   Chronic pain -Lipase was normal -CT abdomen and pelvis showed findings consistent with acute on chronic pancreatitis involving pancreatic  head and proximal body with adjacent resultant duodenitis. Plan: Continue management, transition to oral regimen when able Hold IV fluids at this time considering ESRD Continue clear liquid diet, advance as tolerated IV PPI Anti-emetics Continuing home Creon  Diarrhea x 2 weeks Suspect chronic diarrhea in the setting of acute on chronic pancreatitis Stool studies sent, pending results Clinical suspicion for infectious diarrhea is low Stool studies pending however patient will require inpatient hemodialysis Infection prevention is aware and will make exception in regards to discontinuation of enteric precautions so the patient can receive hemodialysis    ESRD on hemodialysis [facility]) -Patient missed dialysis session on the day of arrival but stable and not in need of urgent dialysis based on symptoms and lab work -Nephrology consult for continuation of dialysis Plan: Nephrology consult appreciated Inpatient hemodialysis today 2/11    Essential hypertension Nausea improved, can restart home medications Restart Coreg 25 mg twice daily Restart amlodipine 5 mg daily Hydralazine, 25 mg every 8 hours     Atrial fibrillation ([place]) Resume carvedilol Metoprolol IV for sustained tachycardia Patient not on prophylactic anticoagulation    S/P CABG x 3   AAA (abdominal aortic aneurysm) without rupture ([place])   Chronic diastolic congestive heart failure ([place]) Resume home meds as above Resume Plavix Monitor volume status Telemetry    Type II diabetes mellitus with renal manifestations ([place]) -Sliding scale insulin coverage   DVT prophylaxis: Subcu heparin Code Status: Full Family Communication: Spoke to daughter, 10/17/2019 Disposition Plan: Anticipate home after resolution of acute pancreatitis symptoms   Consultants:   Nephrology  Procedures:   None  Antimicrobials:   None   Subjective: Patient seen and examined Persistent abdominal pain Nausea improved  Tolerating small amounts of clear liquids  Objective: Vitals:   10/17/19 1529 10/17/19 2232 10/17/19 2352 10/18/19 0824  BP: (!) 142/54 (!) 142/46 (!) 137/45 (!) 163/53  Pulse: 61 63 70 63  Resp:   17 16  Temp: 98.2 F (36.8 C) 99.2 F (37.3 C) 98 F (36.7 C) 98.9 F (37.2 C)  TempSrc: Oral Oral    SpO2: 100%  96% 99%  Weight:      Height:        Intake/Output Summary (Last 24 hours) at 10/18/2019 1128 Last data filed at 10/17/2019 1706 Gross per 24 hour  Intake -  Output 100 ml  Net -100 ml   Filed Weights   10/16/19 1647 10/17/19 0024  Weight: 44 kg 44 kg    Examination:  General exam: Appears calm and comfortable  Respiratory system: Clear to auscultation. Respiratory effort normal. Cardiovascular system: S1 & S2 heard, RRR. No JVD, murmurs, rubs, gallops or clicks. No pedal edema. Gastrointestinal system: Nondistended, tender to palpation in epigastrium, normal bowel sounds  Central nervous system: Alert and oriented. No focal neurological deficits. Extremities: Symmetric 5 x 5 power. Skin: No rashes, lesions or ulcers Psychiatry: Judgement and insight appear normal. Mood & affect appropriate.     Data Reviewed: I have personally reviewed following labs and imaging studies  CBC: Recent Labs  Lab 10/16/19 1652 10/17/19 0250  WBC 16.0* 11.7*  HGB 10.6* 9.8*  HCT 32.4* 29.6*  MCV 102.2* 102.8*  PLT 219 993   Basic Metabolic Panel: Recent Labs  Lab 10/16/19 1652 10/17/19 0250  NA 134* 136  K 4.5 4.3  CL 96* 100  CO2 24 26  GLUCOSE 127* 90  BUN 26* 33*  CREATININE 3.75* 3.61*  CALCIUM 8.1* 7.9*   GFR: Estimated Creatinine Clearance: 10.6 mL/min (A) (by C-G formula based on SCr of 3.61 mg/dL (H)). Liver Function Tests: Recent Labs  Lab 10/16/19 1652 10/17/19 0250  AST 20 18  ALT 15 14  ALKPHOS 152* 131*  BILITOT 0.9 0.9  PROT 5.2* 5.2*  ALBUMIN 2.4* 2.4*   Recent Labs  Lab 10/16/19 1652  LIPASE 23   No results for input(s):  AMMONIA in the last 168 hours. Coagulation Profile: No results for input(s): INR, PROTIME in the last 168 hours. Cardiac Enzymes: No results for input(s): CKTOTAL, CKMB, CKMBINDEX, TROPONINI in the last 168 hours. BNP (last 3 results) No results for input(s): PROBNP in the last 8760 hours. HbA1C: Recent Labs    10/16/19 1652  HGBA1C 4.9   CBG: Recent Labs  Lab 10/17/19 1700 10/17/19 2102 10/17/19 2357 10/18/19 0354 10/18/19 0814  GLUCAP 118* 106* 153* 119* 97   Lipid Profile: No results for input(s): CHOL, HDL, LDLCALC, TRIG, CHOLHDL, LDLDIRECT in the last 72 hours. Thyroid Function Tests: No results for input(s): TSH, T4TOTAL, FREET4, T3FREE, THYROIDAB in the last 72 hours. Anemia Panel: No results for input(s): VITAMINB12, FOLATE, FERRITIN, TIBC, IRON, RETICCTPCT in the last 72 hours. Sepsis Labs: No results for input(s): PROCALCITON, LATICACIDVEN in the last 168 hours.  Recent Results (from the past 240 hour(s))  SARS CORONAVIRUS 2 (TAT 6-24 HRS) Nasopharyngeal Nasopharyngeal Swab     Status: None   Collection Time: 10/16/19  9:18 PM   Specimen: Nasopharyngeal Swab  Result Value Ref Range Status   SARS Coronavirus 2 NEGATIVE NEGATIVE Final    Comment: (  NOTE) SARS-CoV-2 target nucleic acids are NOT DETECTED. The SARS-CoV-2 RNA is generally detectable in upper and lower respiratory specimens during the acute phase of infection. Negative results do not preclude SARS-CoV-2 infection, do not rule out co-infections with other pathogens, and should not be used as the sole basis for treatment or other patient management decisions. Negative results must be combined with clinical observations, patient history, and epidemiological information. The expected result is Negative. Fact Sheet for Patients: SugarRoll.be Fact Sheet for Healthcare Providers: https://www.woods-mathews.com/ This test is not yet approved or cleared by the [place] FDA and  has been authorized for detection and/or diagnosis of SARS-CoV-2 by FDA under an Emergency Use Authorization (EUA). This EUA will remain  in effect (meaning this test can be used) for the duration of the COVID-19 declaration under Section 56 4(b)(1) of the Act, 21 U.S.C. section 360bbb-3(b)(1), unless the authorization is terminated or revoked sooner. Performed at [facility], [place] [address], [place], [place] [address]   C difficile quick scan w PCR reflex     Status: None   Collection Time: 10/18/19  7:48 AM   Specimen: STOOL  Result Value Ref Range Status   C Diff antigen NEGATIVE NEGATIVE Final   C Diff toxin NEGATIVE NEGATIVE Final   C Diff interpretation No C. difficile detected.  Final    Comment: Performed at [facility], [address] [place], [place] [address]         Radiology Studies: CT ABDOMEN PELVIS WO CONTRAST  Result Date: 10/16/2019 CLINICAL DATA:  Abdominal pain EXAM: CT ABDOMEN AND PELVIS WITHOUT CONTRAST TECHNIQUE: Multidetector CT imaging of the abdomen and pelvis was performed following the standard protocol without IV contrast. COMPARISON:  July 27, 2019 FINDINGS: Lower chest: The visualized heart size within normal limits. No pericardial fluid/thickening. No hiatal hernia. Streaky atelectasis seen at the posterior right lung base. Hepatobiliary: Although limited due to the lack of intravenous contrast, normal in appearance without gross focal abnormality. The patient is status post cholecystectomy. There is stable mildly dilated common bile duct. No common bile duct stones however are seen. Pancreas: There is fat stranding changes and enlargement seen of the pancreatic head with diffuse coarse calcifications seen within the pancreatic head and proximal body. There is also resultant surrounding inflammatory changes and wall thickening seen within the first and second portion of the duodenum. No loculated fluid collection is  seen. No pneumoperitoneum. Spleen: Normal in size. Although limited due to the lack of intravenous contrast, normal in appearance. Adrenals/Urinary Tract: Both adrenal glands appear normal. Multiple bilateral calcifications are seen within the renal hilum, likely vascular and possible punctate renal calculi in the lower poles. No hydronephrosis. No ureteral or bladder calculi are noted. Stomach/Bowel: The stomach Abner major of the small bowel are normal appearance. There is been a prior surgical anastomosis within the sigmoid rectal junction. Scattered colonic diverticula are noted without diverticulitis. Vascular/Lymphatic: There are no enlarged abdominal or pelvic lymph nodes. The patient is status post aorta bi-iliac stent graft. There is unchanged native aneurysm sac measuring 3.9 cm. There is also a bilateral renal arterial stents at the ostia. Dense vascular calcifications are seen throughout. Reproductive: The patient is status post hysterectomy. No adnexal masses or collections seen. Other: No evidence of abdominal wall mass or hernia. Musculoskeletal: No acute or significant osseous findings. 6 IMPRESSION: 1. Findings consistent with acute on chronic pancreatitis involving pancreatic head and proximal body. There is adjacent resultant duodenitis. No pneumoperitoneum or  loculated fluid collections seen. 2. Aorta bi-iliac stent graft with unchanged native aneurysm sac size measuring 3.9 cm. 3. Diverticulosis without diverticulitis. Electronically Signed   By: Prudencio Pair M.D.   On: 10/16/2019 19:21        Scheduled Meds: . allopurinol  100 mg Oral Daily  . amLODipine  5 mg Oral QHS  . vitamin C  500 mg Oral BID  . carvedilol  25 mg Oral BID  . Chlorhexidine Gluconate Cloth  6 each Topical Q0600  . clopidogrel  75 mg Oral Daily  . diphenhydrAMINE  50 mg Oral QHS  . feeding supplement  1 Container Oral TID BM  . feeding supplement (PRO-STAT SUGAR FREE 64)  30 mL Oral TID  . gabapentin  300 mg  Oral QHS  . heparin injection (subcutaneous)  5,000 Units Subcutaneous Q8H  . hydrALAZINE  25 mg Oral Q8H  . insulin aspart  0-9 Units Subcutaneous Q4H  . lipase/protease/amylase  24,000 Units Oral TID AC  . multivitamin  1 tablet Oral QHS  . traZODone  150 mg Oral QHS   Continuous Infusions:   LOS: 2 days    Time spent: 35 minutes    Sidney Ace, MD [facility] Pager [phone]  If 7PM-7AM, please contact night-coverage  10/18/2019, 11:28 AM

## 2019-10-18 NOTE — Progress Notes (Signed)
Pre HD Tx Assessment   10/18/19 1130  Neurological  Level of Consciousness Alert  Orientation Level Oriented X4  Respiratory  Respiratory Pattern Regular;Unlabored  Chest Assessment Chest expansion symmetrical  Bilateral Breath Sounds Clear;Diminished  Cardiac  Pulse Irregular  Heart Sounds S1, S2  Jugular Venous Distention (JVD) No  ECG Monitor Yes  Cardiac Rhythm Ventricular paced  Ectopy Multifocal PVC's  Antiarrhythmic device No  Vascular  R Radial Pulse +2  L Radial Pulse +2  Edema Generalized  Generalized Edema Other (Comment) (Non Pitting)  Integumentary  Integumentary (WDL) X  Skin Color Appropriate for ethnicity  Skin Condition Dry  Skin Integrity Intact;Skin tear  Musculoskeletal  Musculoskeletal (WDL) WDL  Gastrointestinal  Bowel Sounds Assessment Active  Last BM Date 10/18/19  GU Assessment  Genitourinary (WDL) X  Genitourinary Symptoms Urgency (depends from home)  Psychosocial  Psychosocial (WDL) WDL  Patient Behaviors Cooperative;Calm;Flat affect

## 2019-10-18 NOTE — Progress Notes (Signed)
Hemodialysis patient known at [facility] TTS 1:20, patient normally drives self to treatments. Please contact me with dialysis placement concerns.

## 2019-10-18 NOTE — Plan of Care (Signed)
  Problem: Clinical Measurements: Goal: Ability to maintain clinical measurements within normal limits will improve Outcome: Progressing Goal: Will remain free from infection Outcome: Progressing Goal: Diagnostic test results will improve Outcome: Progressing Goal: Respiratory complications will improve Outcome: Progressing Goal: Cardiovascular complication will be avoided Outcome: Progressing  HD Tx is progressing

## 2019-10-18 NOTE — Progress Notes (Signed)
HD Tx Started w/out complications    66/44/03 1145  Vital Signs  Pulse Rate 67  Resp 13  BP (!) 158/50  Oxygen Therapy  SpO2 98 %  O2 Device Room Air  During Hemodialysis Assessment  Blood Flow Rate (mL/min) 200 mL/min  Arterial Pressure (mmHg) -80 mmHg  Venous Pressure (mmHg) 120 mmHg  Transmembrane Pressure (mmHg) 60 mmHg  Ultrafiltration Rate (mL/min) 290 mL/min  Dialysate Flow Rate (mL/min) 600 ml/min  Conductivity: Machine  13.9  HD Safety Checks Performed Yes  Dialysis Fluid Bolus Normal Saline  Bolus Amount (mL) 250 mL  Intra-Hemodialysis Comments Tx initiated

## 2019-10-18 NOTE — Progress Notes (Signed)
Pre HD Tx Note  Pt arrived from her room to receive routine HD. Pt is A*4. On RA SPO2 95% Pt reports no chest pain or SOB however reports abdominal pain that is chronic due to chronic due to chronic pancreatitis. Primary RN made aware. AVF found to have some stenosis above the venous access. AVF got infiltrated however, infiltration resolved and AVF could be safely accessed current BFR is 200. Nephrologist made aware and notified  Tx is planned for 3.5 hrs UF goal is 0 - 0.5L. Will keep monitoring   10/18/19 1130  Hand-Off documentation  Report given to (Full Name) Newt Minion RN   Report received from (Full Name) Carmin Richmond RN   Vital Signs  Temp 98.6 F (37 C)  Temp Source Oral  Pulse Rate 67  Pulse Rate Source Monitor  Resp 20  BP (!) 162/56  BP Location Right Arm  BP Method Automatic  Patient Position (if appropriate) Lying  Oxygen Therapy  SpO2 98 %  O2 Device Room Air  Pain Assessment  Pain Scale 0-10  Pain Score 6  Pain Type Chronic pain  Pain Location Abdomen  Pain Frequency Intermittent  Pain Onset Gradual  Patients Stated Pain Goal 0  Pain Intervention(s) RN made aware  Multiple Pain Sites No  Time-Out for Hemodialysis  What Procedure? HD  Pt Identifiers(min of two) First/Last Name;MRN/Account#  Correct Site? Yes  Correct Side? Yes  Correct Procedure? Yes  Consents Verified? Yes  Safety Precautions Reviewed? Yes  Engineer, civil (consulting) Number 7  Station Number 4  UF/Alarm Test Passed  Conductivity: Meter 14  Conductivity: Machine  13.8  pH 7.2  Reverse Osmosis Main  Normal Saline Lot Number [id number]  Dialyzer Lot Number [id number]  Disposable Set Lot Number [id number]  Machine Temperature 96.8 F (36 C)  Musician and Audible Yes  Blood Lines Intact and Secured Yes  Pre Treatment Patient Checks  Vascular access used during treatment Fistula  HD catheter dressing before treatment WDL  Patient is receiving dialysis in a chair  (In Bed)  Hepatitis  B Surface Antigen Results Negative  Date Hepatitis B Surface Antigen Drawn 10/09/19  Hepatitis B Surface Antibody  (<10)  Date Hepatitis B Surface Antibody Drawn 10/09/19  Hemodialysis Consent Verified Yes  Hemodialysis Standing Orders Initiated Yes  ECG (Telemetry) Monitor On Yes  Prime Ordered Normal Saline  Length of  DialysisTreatment -hour(s) 3.5 Hour(s)  Dialysis Treatment Comments  (Na140)  Dialyzer Elisio 17H NR  Dialysate 3K;2.5 Ca  Dialysis Anticoagulant Heparin  Dialysate Flow Ordered 600  Blood Flow Rate Ordered 400 mL/min  Ultrafiltration Goal 0.5 Liters  Pre Treatment Labs Phosphorus  Dialysis Blood Pressure Support Ordered Albumin  Education / Care Plan  Dialysis Education Provided Yes  Documented Education in Care Plan Yes  Fistula / Graft Left Upper arm Arteriovenous fistula  Placement Date/Time: 11/03/17 0955   Placed prior to admission: No  Orientation: Left  Access Location: Upper arm  Access Type: Arteriovenous fistula  Site Condition Painful  Fistula / Graft Assessment Present;Thrill;Bruit  Status Accessed  Drainage Description None

## 2019-10-18 NOTE — Progress Notes (Signed)
Post HD Tx Assessment   10/18/19 1530  Neurological  Level of Consciousness Alert  Orientation Level Oriented X4  Respiratory  Respiratory Pattern Regular;Unlabored  Chest Assessment Chest expansion symmetrical  Bilateral Breath Sounds Clear;Diminished  Cardiac  Pulse Irregular  Heart Sounds S1, S2  Jugular Venous Distention (JVD) No  ECG Monitor Yes  Cardiac Rhythm Ventricular paced  Ectopy Multifocal PVC's  Antiarrhythmic device No  Vascular  R Radial Pulse +2  L Radial Pulse +2  Edema Generalized  Generalized Edema Other (Comment) (Non Pitting)  Integumentary  Integumentary (WDL) X  Skin Color Appropriate for ethnicity  Skin Condition Dry  Skin Integrity Intact;Skin tear  Musculoskeletal  Musculoskeletal (WDL) WDL  Gastrointestinal  Bowel Sounds Assessment Active  Last BM Date 10/18/19  GU Assessment  Genitourinary (WDL) X  Genitourinary Symptoms Urgency (depends from home)  Psychosocial  Psychosocial (WDL) WDL  Patient Behaviors Cooperative;Calm;Flat affect

## 2019-10-18 NOTE — Progress Notes (Signed)
Post HD Tx Note  Pt tolerated well the Tx Pt reports no chest pain or SOB. Pt continues to be on RA SPO2 100%. Pt requested Tx to be ended 70mn earlier than prescribed  time. UF met 920 vs 10057mprescribed. Pt Tx run for 3.15 hrs on 3K2.5Ca. Bloos work collected and sent to labs. BP continued to be WDL throughout the Tx. AVF WDL post Tx   10/18/19 1527  Hand-Off documentation  Report given to (Full Name) NiCarmin RichmondN   Report received from (Full Name) DiNewt MinionN   Vital Signs  Temp 98.2 F (36.8 C)  Temp Source Oral  Pulse Rate 79  Pulse Rate Source Monitor  Resp 12  BP (!) 170/52  BP Location Right Arm  BP Method Automatic  Patient Position (if appropriate) Lying  Oxygen Therapy  SpO2 93 %  O2 Device Room Air  Post-Hemodialysis Assessment  Rinseback Volume (mL) 250 mL  KECN 45.7 V  Dialyzer Clearance Lightly streaked  Duration of HD Treatment -hour(s) 3.15 hour(s)  Hemodialysis Intake (mL) 500 mL  UF Total -Machine (mL) 920 mL  Net UF (mL) 420 mL  Tolerated HD Treatment Yes  AVG/AVF Arterial Site Held (minutes) 7 minutes  AVG/AVF Venous Site Held (minutes) 5 minutes  Fistula / Graft Left Upper arm Arteriovenous fistula  Placement Date/Time: 11/03/17 0955   Placed prior to admission: No  Orientation: Left  Access Location: Upper arm  Access Type: Arteriovenous fistula  Site Condition No complications  Fistula / Graft Assessment Present;Thrill;Bruit  Status Deaccessed  Needle Size 16  Drainage Description None

## 2019-10-18 NOTE — Progress Notes (Signed)
HD Tx Completed    10/18/19 1515  Vital Signs  Pulse Rate 78  Resp 17  BP (!) 165/51  Oxygen Therapy  SpO2 96 %  O2 Device Room Air  During Hemodialysis Assessment  Blood Flow Rate (mL/min) 250 mL/min  Arterial Pressure (mmHg) -110 mmHg  Venous Pressure (mmHg) 200 mmHg  Transmembrane Pressure (mmHg) 600 mmHg  Ultrafiltration Rate (mL/min) 290 mL/min  Dialysate Flow Rate (mL/min) 600 ml/min  Conductivity: Machine  13.9  HD Safety Checks Performed Yes  Intra-Hemodialysis Comments Tolerated well;Tx completed

## 2019-10-18 NOTE — Progress Notes (Signed)
[address] [place], [place] [address] Phone [phone]. Fax [phone]  Date: 10/18/2019                  Patient Name:  [name]  MRN: [id number]  DOB: [birth year]  Age / Sex: 69 y.o., female          Subjective: Patient is a 69 y.o. female with medical problems of ESRD, on hemodialysis, CAD/CABG, COPD, atrial fibrillation, hypertension, COPD, chronic pancreatitis and chronic abdominal pain requiring recurrent hospitalizations, who was admitted to [facility] on 10/16/2019 for evaluation of abdominal pain, nausea.  Patient also reports loose bowel movements prior to arrival.   Patient seen during dialysis   HEMODIALYSIS FLOWSHEET:  Blood Flow Rate (mL/min): 250 mL/min Arterial Pressure (mmHg): -110 mmHg Venous Pressure (mmHg): 200 mmHg Transmembrane Pressure (mmHg): 600 mmHg Ultrafiltration Rate (mL/min): 290 mL/min Dialysate Flow Rate (mL/min): 600 ml/min Conductivity: Machine : 13.9 Conductivity: Machine : 13.9 Dialysis Fluid Bolus: Normal Saline Bolus Amount (mL): 250 mL  Access infiltrated. re cannulated by nurse;     Current medications: Current Facility-Administered Medications  Medication Dose Route Frequency Provider Last Rate Last Admin  . albuterol (PROVENTIL) (2.5 MG/3ML) 0.083% nebulizer solution 3 mL  3 mL Inhalation Q4H PRN Sreenath, Sudheer B, MD      . allopurinol (ZYLOPRIM) tablet 100 mg  100 mg Oral Daily Sreenath, Sudheer B, MD      . amLODipine (NORVASC) tablet 5 mg  5 mg Oral QHS Amed Datta, MD      . ascorbic acid (VITAMIN C) tablet 500 mg  500 mg Oral BID Ralene Muskrat B, MD   500 mg at 10/17/19 2239  . carvedilol (COREG) tablet 25 mg  25 mg Oral BID Ralene Muskrat B, MD   25 mg at 10/17/19 2240  . Chlorhexidine Gluconate Cloth 2 % PADS 6 each  6 each Topical Q0600 Murlean Iba, MD      . clopidogrel (PLAVIX) tablet 75 mg  75 mg Oral Daily Priscella Mann, Sudheer B, MD   75 mg at 10/17/19 1244  . diphenhydrAMINE (BENADRYL) capsule  50 mg  50 mg Oral QHS Ralene Muskrat B, MD   50 mg at 10/17/19 2241  . feeding supplement (BOOST / RESOURCE BREEZE) liquid 1 Container  1 Container Oral TID BM Sidney Ace, MD   1 Container at 10/18/19 [phone]  . feeding supplement (PRO-STAT SUGAR FREE 64) liquid 30 mL  30 mL Oral TID Ralene Muskrat B, MD   30 mL at 10/18/19 0924  . gabapentin (NEURONTIN) capsule 300 mg  300 mg Oral QHS Sreenath, Sudheer B, MD   300 mg at 10/17/19 2240  . heparin injection 5,000 Units  5,000 Units Subcutaneous Q8H Athena Masse, MD   5,000 Units at 10/18/19 0525  . hydrALAZINE (APRESOLINE) tablet 25 mg  25 mg Oral Q8H Sreenath, Sudheer B, MD   25 mg at 10/18/19 0525  . HYDROmorphone (DILAUDID) injection 1 mg  1 mg Intravenous Q2H PRN Athena Masse, MD   1 mg at 10/18/19 1300  . insulin aspart (novoLOG) injection 0-9 Units  0-9 Units Subcutaneous Q4H Athena Masse, MD   Stopped at 10/17/19 1625  . lipase/protease/amylase (CREON) capsule 24,000 Units  24,000 Units Oral TID AC Ralene Muskrat B, MD   24,000 Units at 10/17/19 1846  . multivitamin (RENA-VIT) tablet 1 tablet  1 tablet Oral QHS Sreenath, Sudheer B, MD      . ondansetron (ZOFRAN) tablet 4 mg  4 mg Oral Q6H PRN Athena Masse, MD       Or  . ondansetron [facility]) injection 4 mg  4 mg Intravenous Q6H PRN Athena Masse, MD   4 mg at 10/18/19 1007  . promethazine (PHENERGAN) injection 6.25 mg  6.25 mg Intravenous Q6H PRN Athena Masse, MD   6.25 mg at 10/17/19 2440  . traZODone (DESYREL) tablet 150 mg  150 mg Oral QHS Sreenath, Sudheer B, MD   150 mg at 10/17/19 2240     Vital Signs: Blood pressure (!) 168/76, pulse 82, temperature 98.9 F (37.2 C), resp. rate 18, height 5\' 5"  (1.651 m), weight 44 kg, SpO2 100 %.   Intake/Output Summary (Last 24 hours) at 10/18/2019 1645 Last data filed at 10/18/2019 1527 Gross per 24 hour  Intake --  Output 520 ml  Net -520 ml    Weight trends: Filed Weights   10/16/19 1647 10/17/19 0024   Weight: 44 kg 44 kg    Physical Exam: General:  Thin, frail, elderly female, laying in the bed  HEENT  anicteric, moist oral mucous membranes  Neck:  Supple, no mass  Lungs:  normal breathing effort, clear to auscultation  Heart::  No rub or gallop, irregular rhythm  Abdomen:  Soft, mild midepigastric tenderness  Extremities:  No peripheral edema  Neurologic:  Alert, oriented, able to answer questions  Skin:  Warm, dry  Access:  Left arm AV fistula    Lab results: Basic Metabolic Panel: Recent Labs  Lab 10/16/19 1652 10/17/19 0250 10/18/19 1100  NA 134* 136  --   K 4.5 4.3  --   CL 96* 100  --   CO2 24 26  --   GLUCOSE 127* 90  --   BUN 26* 33*  --   CREATININE 3.75* 3.61*  --   CALCIUM 8.1* 7.9*  --   PHOS  --   --  2.2*    Liver Function Tests: Recent Labs  Lab 10/17/19 0250  AST 18  ALT 14  ALKPHOS 131*  BILITOT 0.9  PROT 5.2*  ALBUMIN 2.4*   Recent Labs  Lab 10/16/19 1652  LIPASE 23   No results for input(s): AMMONIA in the last 168 hours.  CBC: Recent Labs  Lab 10/16/19 1652 10/17/19 0250  WBC 16.0* 11.7*  HGB 10.6* 9.8*  HCT 32.4* 29.6*  MCV 102.2* 102.8*  PLT 219 198    Cardiac Enzymes: No results for input(s): CKTOTAL, TROPONINI in the last 168 hours.  BNP: Invalid input(s): POCBNP  CBG: Recent Labs  Lab 10/17/19 2102 10/17/19 2357 10/18/19 0354 10/18/19 0814 10/18/19 1614  GLUCAP 106* 153* 119* 97 120*    Microbiology: Recent Results (from the past 720 hour(s))  SARS CORONAVIRUS 2 (TAT 6-24 HRS) Nasopharyngeal Nasopharyngeal Swab     Status: None   Collection Time: 10/16/19  9:18 PM   Specimen: Nasopharyngeal Swab  Result Value Ref Range Status   SARS Coronavirus 2 NEGATIVE NEGATIVE Final    Comment: (NOTE) SARS-CoV-2 target nucleic acids are NOT DETECTED. The SARS-CoV-2 RNA is generally detectable in upper and lower respiratory specimens during the acute phase of infection. Negative results do not preclude  SARS-CoV-2 infection, do not rule out co-infections with other pathogens, and should not be used as the sole basis for treatment or other patient management decisions. Negative results must be combined with clinical observations, patient history, and epidemiological information. The expected result is Negative. Fact Sheet for Patients: SugarRoll.be Fact  Sheet for Healthcare Providers: https://www.woods-mathews.com/ This test is not yet approved or cleared by the [place] FDA and  has been authorized for detection and/or diagnosis of SARS-CoV-2 by FDA under an Emergency Use Authorization (EUA). This EUA will remain  in effect (meaning this test can be used) for the duration of the COVID-19 declaration under Section 56 4(b)(1) of the Act, 21 U.S.C. section 360bbb-3(b)(1), unless the authorization is terminated or revoked sooner. Performed at [facility], [place] [address] [place], [place] [address]   C difficile quick scan w PCR reflex     Status: None   Collection Time: 10/18/19  7:48 AM   Specimen: STOOL  Result Value Ref Range Status   C Diff antigen NEGATIVE NEGATIVE Final   C Diff toxin NEGATIVE NEGATIVE Final   C Diff interpretation No C. difficile detected.  Final    Comment: Performed at [facility], [address] [place], [place] [address]     Coagulation Studies: No results for input(s): LABPROT, INR in the last 72 hours.  Urinalysis: Recent Labs    10/16/19 1652  COLORURINE YELLOW*  LABSPEC 1.017  PHURINE 5.0  GLUCOSEU NEGATIVE  HGBUR NEGATIVE  BILIRUBINUR NEGATIVE  KETONESUR NEGATIVE  PROTEINUR NEGATIVE  NITRITE NEGATIVE  LEUKOCYTESUR NEGATIVE        Imaging: CT ABDOMEN PELVIS WO CONTRAST  Result Date: 10/16/2019 CLINICAL DATA:  Abdominal pain EXAM: CT ABDOMEN AND PELVIS WITHOUT CONTRAST TECHNIQUE: Multidetector CT imaging of the abdomen and pelvis was performed following the standard  protocol without IV contrast. COMPARISON:  July 27, 2019 FINDINGS: Lower chest: The visualized heart size within normal limits. No pericardial fluid/thickening. No hiatal hernia. Streaky atelectasis seen at the posterior right lung base. Hepatobiliary: Although limited due to the lack of intravenous contrast, normal in appearance without gross focal abnormality. The patient is status post cholecystectomy. There is stable mildly dilated common bile duct. No common bile duct stones however are seen. Pancreas: There is fat stranding changes and enlargement seen of the pancreatic head with diffuse coarse calcifications seen within the pancreatic head and proximal body. There is also resultant surrounding inflammatory changes and wall thickening seen within the first and second portion of the duodenum. No loculated fluid collection is seen. No pneumoperitoneum. Spleen: Normal in size. Although limited due to the lack of intravenous contrast, normal in appearance. Adrenals/Urinary Tract: Both adrenal glands appear normal. Multiple bilateral calcifications are seen within the renal hilum, likely vascular and possible punctate renal calculi in the lower poles. No hydronephrosis. No ureteral or bladder calculi are noted. Stomach/Bowel: The stomach Abner major of the small bowel are normal appearance. There is been a prior surgical anastomosis within the sigmoid rectal junction. Scattered colonic diverticula are noted without diverticulitis. Vascular/Lymphatic: There are no enlarged abdominal or pelvic lymph nodes. The patient is status post aorta bi-iliac stent graft. There is unchanged native aneurysm sac measuring 3.9 cm. There is also a bilateral renal arterial stents at the ostia. Dense vascular calcifications are seen throughout. Reproductive: The patient is status post hysterectomy. No adnexal masses or collections seen. Other: No evidence of abdominal wall mass or hernia. Musculoskeletal: No acute or significant  osseous findings. 6 IMPRESSION: 1. Findings consistent with acute on chronic pancreatitis involving pancreatic head and proximal body. There is adjacent resultant duodenitis. No pneumoperitoneum or loculated fluid collections seen. 2. Aorta bi-iliac stent graft with unchanged native aneurysm sac size measuring 3.9 cm. 3. Diverticulosis without diverticulitis. Electronically Signed   By:  Prudencio Pair M.D.   On: 10/16/2019 19:21     Assessment & Plan: Pt is a 69 y.o. Caucasian   female with  medical problems of ESRD, on hemodialysis, CAD/CABG 03/2011, COPD, atrial fibrillation, hypertension, COPD, chronic pancreatitis and chronic abdominal pain requiring recurrent hospitalizations , h/o AAA, cholecystectomy, severe peripheral arterial disease including bilateral renal artery stent, subclavian artery stenosis, left, left CEA was admitted on 10/16/2019 with abdominal pain, diarrhea Acute pancreatitis [K85.90] Acute on chronic pancreatitis ([place]) [K85.90, K86.1]   [facility]/TTS/left arm AV fistula/CK [place]  #End-stage renal disease  Patient seen during dialysis Tolerating well    #Acute pancreatitis Tolerating clears Management as per hospitalist team  #Anemia of chronic kidney disease Lab Results  Component Value Date   HGB 9.8 (L) 10/17/2019    Epogen with hemodialysis          LOS: 2 Nathian Stencil Candiss Norse 2/11/20214:45 PM    Note: This note was prepared with Dragon dictation. Any transcription errors are unintentional

## 2019-10-19 LAB — BASIC METABOLIC PANEL
Anion gap: 7 (ref 5–15)
BUN: 13 mg/dL (ref 8–23)
CO2: 29 mmol/L (ref 22–32)
Calcium: 7.8 mg/dL — ABNORMAL LOW (ref 8.9–10.3)
Chloride: 102 mmol/L (ref 98–111)
Creatinine, Ser: 1.57 mg/dL — ABNORMAL HIGH (ref 0.44–1.00)
GFR calc Af Amer: 39 mL/min — ABNORMAL LOW (ref >60)
GFR calc non Af Amer: 34 mL/min — ABNORMAL LOW (ref >60)
Glucose, Bld: 95 mg/dL (ref 70–99)
Potassium: 3.8 mmol/L (ref 3.5–5.1)
Sodium: 138 mmol/L (ref 135–145)

## 2019-10-19 LAB — CBC WITH DIFFERENTIAL/PLATELET
Abs Immature Granulocytes: 0.03 10*3/uL (ref 0.00–0.07)
Basophils Absolute: 0 10*3/uL (ref 0.0–0.1)
Basophils Relative: 1 %
Eosinophils Absolute: 0.1 10*3/uL (ref 0.0–0.5)
Eosinophils Relative: 2 %
HCT: 26.9 % — ABNORMAL LOW (ref 36.0–46.0)
Hemoglobin: 8.9 g/dL — ABNORMAL LOW (ref 12.0–15.0)
Immature Granulocytes: 1 %
Lymphocytes Relative: 11 %
Lymphs Abs: 0.5 10*3/uL — ABNORMAL LOW (ref 0.7–4.0)
MCH: 34.4 pg — ABNORMAL HIGH (ref 26.0–34.0)
MCHC: 33.1 g/dL (ref 30.0–36.0)
MCV: 103.9 fL — ABNORMAL HIGH (ref 80.0–100.0)
Monocytes Absolute: 0.5 10*3/uL (ref 0.1–1.0)
Monocytes Relative: 10 %
Neutro Abs: 3.8 10*3/uL (ref 1.7–7.7)
Neutrophils Relative %: 75 %
Platelets: 157 10*3/uL (ref 150–400)
RBC: 2.59 MIL/uL — ABNORMAL LOW (ref 3.87–5.11)
RDW: 15.6 % — ABNORMAL HIGH (ref 11.5–15.5)
WBC: 5 10*3/uL (ref 4.0–10.5)
nRBC: 0 % (ref 0.0–0.2)

## 2019-10-19 LAB — GLUCOSE, CAPILLARY
Glucose-Capillary: 112 mg/dL — ABNORMAL HIGH (ref 70–99)
Glucose-Capillary: 73 mg/dL (ref 70–99)
Glucose-Capillary: 82 mg/dL (ref 70–99)
Glucose-Capillary: 95 mg/dL (ref 70–99)
Glucose-Capillary: 98 mg/dL (ref 70–99)

## 2019-10-19 LAB — MAGNESIUM: Magnesium: 1.6 mg/dL — ABNORMAL LOW (ref 1.7–2.4)

## 2019-10-19 MED ORDER — HYDROMORPHONE HCL 2 MG PO TABS: 4.0000 mg | ORAL_TABLET | ORAL | Status: DC | PRN

## 2019-10-19 MED ORDER — HYDROMORPHONE HCL 2 MG PO TABS
2.0000 mg | ORAL_TABLET | ORAL | Status: DC | PRN
  Administered 2019-10-19: 05:00:00 2 mg via ORAL
  Filled 2019-10-19: qty 1

## 2019-10-19 MED ORDER — HYDROMORPHONE HCL 2 MG PO TABS
2.0000 mg | ORAL_TABLET | Freq: Once | ORAL | Status: AC
  Administered 2019-10-19: 06:00:00 2 mg via ORAL
  Filled 2019-10-19: qty 1

## 2019-10-19 MED ORDER — HYDROMORPHONE HCL 1 MG/ML IJ SOLN
1.0000 mg | INTRAMUSCULAR | Status: DC | PRN
  Administered 2019-10-19 – 2019-10-21 (×15): 1 mg via INTRAVENOUS
  Filled 2019-10-19 (×16): qty 1

## 2019-10-19 NOTE — Progress Notes (Addendum)
PROGRESS NOTE    [name]  ZOX:[id number] DOB: Jan 24, 1951 DOA: 10/16/2019 PCP: Bernerd Limbo, MD    Brief Narrative:  HPI: [name] is a 70 y.o. female with medical history significant for ESRD on HD TTS, CAD status post CABG, COPD, A. fib, hypertension, COPD and chronic pancreatitis with chronic pain on Creon and oral Dilaudid with recurrent hospitalizations for the same, most recently November 2020, who presents to the emergency room with a 3-day history of nausea and abdominal pain typical for her acute pancreatitis flares.  Abdominal pain is mostly in the epigastrium of severe intensity and nonradiating, aggravated by attempting to eat or drink.  She did vomit once on the day of arrival.  Also states she has been having a loose explosive bowel movement daily.  No history of recent antibiotic use.  She denies fever, chills, cough, chest pain and shortness of breath.'  2/10: Patient seen and evaluated.  Abdominal pain still present.  Endorses mild improvement over interval.  Continues to require IV narcotic.  No vomiting noted over interval.  2/11: Patient seen and evaluated.  Still with abdominal pain.  Tolerating small amounts of clear liquids yesterday.  Continues to require IV narcotic.  Nausea improved over interval.  2/12: Patient seen and evaluated.  Continues with abdominal pain.  Was very upset yesterday.  Patient stated that her narcotics were switched to oral overnight.  No reasoning was provided to patient.  Patient had 7 episodes of watery stool yesterday.  Resolved with Imodium.  Very low suspicion for infectious diarrhea.  Patient is opioid tolerant and continuing to require fairly frequent IV narcotic administration.  Assessment & Plan:   Principal Problem:   Acute on chronic pancreatitis (HCC) Active Problems:   Essential hypertension   Atrial fibrillation (HCC)   COPD with chronic bronchitis (HCC)   S/P CABG x 3   AAA (abdominal aortic aneurysm) without rupture  (HCC)   Chronic diastolic congestive heart failure (HCC)   Type II diabetes mellitus with renal manifestations (HCC)   Recurrent pancreatitis   Chronic pain   ESRD on hemodialysis (HCC)   Acute pancreatitis  Acute on chronic pancreatitis (HCC)   Recurrent pancreatitis   Chronic pain -Lipase was normal -CT abdomen and pelvis showed findings consistent with acute on chronic pancreatitis involving pancreatic head and proximal body with adjacent resultant duodenitis. --Still with significant belly pain and anorexia Plan: Continue IV narcotic administration for now.  Please do not discontinue or de-escalate unless there is a specific documented reason to do so Hold IV fluids at this time considering ESRD Advance to full liquid diet IV PPI Anti-emetics Continuing home Creon  Diarrhea, improved x 2 weeks Suspect chronic diarrhea in the setting of acute on chronic pancreatitis Stool studies sent, pending results Clinical suspicion for infectious diarrhea is low C. difficile negative GI PCR panel still pending however clinical suspicion for infectious diarrhea low As needed Imodium ordered with good result    ESRD on hemodialysis [facility]) -Patient missed dialysis session on the day of arrival but stable and not in need of urgent dialysis based on symptoms and lab work -Nephrology consult for continuation of dialysis Plan: Nephrology consult appreciated Inpatient hemodialysis 2/11    Essential hypertension Nausea improved, can restart home medications Restart Coreg 25 mg twice daily Restart amlodipine 5 mg daily Hydralazine, 25 mg every 8 hours     Atrial fibrillation ([place]) Resume carvedilol Metoprolol IV for sustained tachycardia Patient not on prophylactic anticoagulation  S/P CABG x 3   AAA (abdominal aortic aneurysm) without rupture (HCC)   Chronic diastolic congestive heart failure ([place]) Resume home meds as above Resume Plavix Monitor volume status Telemetry     Type II diabetes mellitus with renal manifestations (HCC) -Sliding scale insulin coverage   DVT prophylaxis: Subcu heparin Code Status: Full Family Communication: Spoke to daughter, 10/19/2019 Disposition Plan: Anticipate home after resolution of acute pancreatitis symptoms   Consultants:   Nephrology  Procedures:   None  Antimicrobials:   None   Subjective: Patient seen and examined Persistent abdominal pain Nausea improved Wants to try soft diet today  Objective: Vitals:   10/18/19 2125 10/19/19 0016 10/19/19 0503 10/19/19 0805  BP: (!) 164/71 (!) 120/44 (!) 155/56 (!) 134/58  Pulse: 71 (!) 51  60  Resp: 16 14  17   Temp:    98.4 F (36.9 C)  TempSrc:    Oral  SpO2: 99% 100%  96%  Weight:      Height:        Intake/Output Summary (Last 24 hours) at 10/19/2019 1158 Last data filed at 10/19/2019 0900 Gross per 24 hour  Intake 780 ml  Output 720 ml  Net 60 ml   Filed Weights   10/16/19 1647 10/17/19 0024  Weight: 44 kg 44 kg    Examination:  General exam: Appears calm and comfortable  Respiratory system: Clear to auscultation. Respiratory effort normal. Cardiovascular system: S1 & S2 heard, RRR. No JVD, murmurs, rubs, gallops or clicks. No pedal edema. Gastrointestinal system: Nondistended, tender to palpation in epigastrium, normal bowel sounds  Central nervous system: Alert and oriented. No focal neurological deficits. Extremities: Symmetric 5 x 5 power. Skin: No rashes, lesions or ulcers Psychiatry: Judgement and insight appear normal. Mood & affect appropriate.     Data Reviewed: I have personally reviewed following labs and imaging studies  CBC: Recent Labs  Lab 10/16/19 1652 10/17/19 0250 10/19/19 0714  WBC 16.0* 11.7* 5.0  NEUTROABS  --   --  3.8  HGB 10.6* 9.8* 8.9*  HCT 32.4* 29.6* 26.9*  MCV 102.2* 102.8* 103.9*  PLT 219 198 401   Basic Metabolic Panel: Recent Labs  Lab 10/16/19 1652 10/17/19 0250 10/18/19 1100  10/19/19 0709 10/19/19 0714  NA 134* 136  --  138  --   K 4.5 4.3  --  3.8  --   CL 96* 100  --  102  --   CO2 24 26  --  29  --   GLUCOSE 127* 90  --  95  --   BUN 26* 33*  --  13  --   CREATININE 3.75* 3.61*  --  1.57*  --   CALCIUM 8.1* 7.9*  --  7.8*  --   MG  --   --   --   --  1.6*  PHOS  --   --  2.2*  --   --    GFR: Estimated Creatinine Clearance: 24.5 mL/min (A) (by C-G formula based on SCr of 1.57 mg/dL (H)). Liver Function Tests: Recent Labs  Lab 10/16/19 1652 10/17/19 0250  AST 20 18  ALT 15 14  ALKPHOS 152* 131*  BILITOT 0.9 0.9  PROT 5.2* 5.2*  ALBUMIN 2.4* 2.4*   Recent Labs  Lab 10/16/19 1652  LIPASE 23   No results for input(s): AMMONIA in the last 168 hours. Coagulation Profile: No results for input(s): INR, PROTIME in the last 168 hours. Cardiac Enzymes: No results for  input(s): CKTOTAL, CKMB, CKMBINDEX, TROPONINI in the last 168 hours. BNP (last 3 results) No results for input(s): PROBNP in the last 8760 hours. HbA1C: Recent Labs    10/16/19 1652  HGBA1C 4.9   CBG: Recent Labs  Lab 10/18/19 1949 10/19/19 0019 10/19/19 0444 10/19/19 0805 10/19/19 1149  GLUCAP 195* 73 98 95 82   Lipid Profile: No results for input(s): CHOL, HDL, LDLCALC, TRIG, CHOLHDL, LDLDIRECT in the last 72 hours. Thyroid Function Tests: No results for input(s): TSH, T4TOTAL, FREET4, T3FREE, THYROIDAB in the last 72 hours. Anemia Panel: No results for input(s): VITAMINB12, FOLATE, FERRITIN, TIBC, IRON, RETICCTPCT in the last 72 hours. Sepsis Labs: No results for input(s): PROCALCITON, LATICACIDVEN in the last 168 hours.  Recent Results (from the past 240 hour(s))  SARS CORONAVIRUS 2 (TAT 6-24 HRS) Nasopharyngeal Nasopharyngeal Swab     Status: None   Collection Time: 10/16/19  9:18 PM   Specimen: Nasopharyngeal Swab  Result Value Ref Range Status   SARS Coronavirus 2 NEGATIVE NEGATIVE Final    Comment: (NOTE) SARS-CoV-2 target nucleic acids are NOT  DETECTED. The SARS-CoV-2 RNA is generally detectable in upper and lower respiratory specimens during the acute phase of infection. Negative results do not preclude SARS-CoV-2 infection, do not rule out co-infections with other pathogens, and should not be used as the sole basis for treatment or other patient management decisions. Negative results must be combined with clinical observations, patient history, and epidemiological information. The expected result is Negative. Fact Sheet for Patients: SugarRoll.be Fact Sheet for Healthcare Providers: https://www.woods-mathews.com/ This test is not yet approved or cleared by the [place] FDA and  has been authorized for detection and/or diagnosis of SARS-CoV-2 by FDA under an Emergency Use Authorization (EUA). This EUA will remain  in effect (meaning this test can be used) for the duration of the COVID-19 declaration under Section 56 4(b)(1) of the Act, 21 U.S.C. section 360bbb-3(b)(1), unless the authorization is terminated or revoked sooner. Performed at [facility], [place] [address], [place], [place] [address]   C difficile quick scan w PCR reflex     Status: None   Collection Time: 10/18/19  7:48 AM   Specimen: STOOL  Result Value Ref Range Status   C Diff antigen NEGATIVE NEGATIVE Final   C Diff toxin NEGATIVE NEGATIVE Final   C Diff interpretation No C. difficile detected.  Final    Comment: Performed at [facility], [address], [place], [place] [address]         Radiology Studies: No results found.      Scheduled Meds: . allopurinol  100 mg Oral Daily  . amLODipine  5 mg Oral QHS  . vitamin C  500 mg Oral BID  . carvedilol  25 mg Oral BID  . Chlorhexidine Gluconate Cloth  6 each Topical Q0600  . clopidogrel  75 mg Oral Daily  . diphenhydrAMINE  50 mg Oral QHS  . [START ON 10/20/2019] epoetin (EPOGEN/PROCRIT) injection  4,000 Units Intravenous Q  T,Th,Sa-HD  . feeding supplement  1 Container Oral TID BM  . feeding supplement (PRO-STAT SUGAR FREE 64)  30 mL Oral TID  . gabapentin  300 mg Oral QHS  . heparin injection (subcutaneous)  5,000 Units Subcutaneous Q8H  . hydrALAZINE  25 mg Oral Q8H  . insulin aspart  0-9 Units Subcutaneous Q4H  . lipase/protease/amylase  24,000 Units Oral TID AC  . multivitamin  1 tablet Oral QHS  . nicotine  21  mg Transdermal Daily  . traZODone  150 mg Oral QHS   Continuous Infusions:   LOS: 3 days    Time spent: 35 minutes    Sidney Ace, MD [facility] Pager [phone]  If 7PM-7AM, please contact night-coverage  10/19/2019, 11:58 AM

## 2019-10-19 NOTE — Care Management Important Message (Signed)
Important Message  Patient Details  Name: ZOELLA ROBERTI MRN: [id number] Date of Birth: 12-Dec-1950   Medicare Important Message Given:  Yes     Juliann Pulse A Latroy Gaymon 10/19/2019, 1:21 PM

## 2019-10-19 NOTE — Progress Notes (Signed)
[address] [place], [place] [address] Phone [phone]. Fax [phone]  Date: 10/19/2019                  Patient Name:  [name]  MRN: [id number]  DOB: 01/27/1951  Age / Sex: 69 y.o., female          Subjective: Patient is a 69 y.o. female with medical problems of ESRD, on hemodialysis, CAD/CABG, COPD, atrial fibrillation, hypertension, COPD, chronic pancreatitis and chronic abdominal pain requiring recurrent hospitalizations, who was admitted to [facility] on 10/16/2019 for evaluation of abdominal pain, nausea.  Patient also reports loose bowel movements prior to arrival.   C/o pain over access infiltration Asking for warm compress  Current medications: Current Facility-Administered Medications  Medication Dose Route Frequency Provider Last Rate Last Admin  . albuterol (PROVENTIL) (2.5 MG/3ML) 0.083% nebulizer solution 3 mL  3 mL Inhalation Q4H PRN Sreenath, Sudheer B, MD      . allopurinol (ZYLOPRIM) tablet 100 mg  100 mg Oral Daily Priscella Mann, Sudheer B, MD   100 mg at 10/19/19 0825  . amLODipine (NORVASC) tablet 5 mg  5 mg Oral QHS Murlean Iba, MD   5 mg at 10/18/19 2126  . ascorbic acid (VITAMIN C) tablet 500 mg  500 mg Oral BID Ralene Muskrat B, MD   500 mg at 10/19/19 0826  . carvedilol (COREG) tablet 25 mg  25 mg Oral BID Ralene Muskrat B, MD   25 mg at 10/19/19 0826  . Chlorhexidine Gluconate Cloth 2 % PADS 6 each  6 each Topical Q0600 Murlean Iba, MD   6 each at 10/19/19 0544  . clopidogrel (PLAVIX) tablet 75 mg  75 mg Oral Daily Ralene Muskrat B, MD   75 mg at 10/19/19 0826  . diphenhydrAMINE (BENADRYL) capsule 50 mg  50 mg Oral QHS Ralene Muskrat B, MD   50 mg at 10/18/19 2126  . [START ON 10/20/2019] epoetin alfa (EPOGEN) injection 4,000 Units  4,000 Units Intravenous Q T,Th,Sa-HD Candiss Norse, Wisam Siefring, MD      . feeding supplement (BOOST / RESOURCE BREEZE) liquid 1 Container  1 Container Oral TID BM Sidney Ace, MD   1 Container at 10/19/19  [id number]  . feeding supplement (PRO-STAT SUGAR FREE 64) liquid 30 mL  30 mL Oral TID Ralene Muskrat B, MD   30 mL at 10/19/19 0826  . gabapentin (NEURONTIN) capsule 300 mg  300 mg Oral QHS Sreenath, Sudheer B, MD   300 mg at 10/18/19 2127  . heparin injection 5,000 Units  5,000 Units Subcutaneous Q8H Athena Masse, MD   5,000 Units at 10/19/19 0610  . hydrALAZINE (APRESOLINE) tablet 25 mg  25 mg Oral Q8H Sreenath, Sudheer B, MD   25 mg at 10/19/19 0505  . HYDROmorphone (DILAUDID) injection 1 mg  1 mg Intravenous Q2H PRN Ralene Muskrat B, MD   1 mg at 10/19/19 1319  . insulin aspart (novoLOG) injection 0-9 Units  0-9 Units Subcutaneous Q4H Athena Masse, MD   2 Units at 10/18/19 2018  . lipase/protease/amylase (CREON) capsule 24,000 Units  24,000 Units Oral TID AC Ralene Muskrat B, MD   24,000 Units at 10/19/19 1224  . loperamide (IMODIUM) capsule 2 mg  2 mg Oral PRN Ralene Muskrat B, MD   2 mg at 10/18/19 2255  . multivitamin (RENA-VIT) tablet 1 tablet  1 tablet Oral QHS Ralene Muskrat B, MD   1 tablet at 10/18/19 2128  . nicotine (NICODERM CQ - dosed  in mg/24 hours) patch 21 mg  21 mg Transdermal Daily Ralene Muskrat B, MD   21 mg at 10/19/19 0830  . ondansetron (ZOFRAN) tablet 4 mg  4 mg Oral Q6H PRN Athena Masse, MD       Or  . ondansetron [facility]) injection 4 mg  4 mg Intravenous Q6H PRN Athena Masse, MD   4 mg at 10/18/19 1922  . promethazine (PHENERGAN) injection 6.25 mg  6.25 mg Intravenous Q6H PRN Athena Masse, MD   6.25 mg at 10/18/19 2011  . traZODone (DESYREL) tablet 150 mg  150 mg Oral QHS Ralene Muskrat B, MD   150 mg at 10/18/19 2127     Vital Signs: Blood pressure (!) 134/58, pulse 60, temperature 98.4 F (36.9 C), temperature source Oral, resp. rate 17, height 5\' 5"  (1.651 m), weight 44 kg, SpO2 96 %.   Intake/Output Summary (Last 24 hours) at 10/19/2019 1437 Last data filed at 10/19/2019 1300 Gross per 24 hour  Intake 1020 ml  Output 720 ml   Net 300 ml    Weight trends: [employer]   10/16/19 1647 10/17/19 0024  Weight: 44 kg 44 kg    Physical Exam: General:  Thin, frail, elderly female, laying in the bed  HEENT  anicteric, moist oral mucous membranes  Neck:  Supple, no mass  Lungs:  normal breathing effort, clear to auscultation  Heart::  No rub or gallop, irregular rhythm  Abdomen:  Soft, mild midepigastric tenderness  Extremities:  No peripheral edema  Neurologic:  Alert, oriented, able to answer questions  Skin:  Warm, dry  Access:  Left arm AV fistula    Lab results: Basic Metabolic Panel: Recent Labs  Lab 10/16/19 1652 10/17/19 0250 10/18/19 1100 10/19/19 0709 10/19/19 0714  NA 134* 136  --  138  --   K 4.5 4.3  --  3.8  --   CL 96* 100  --  102  --   CO2 24 26  --  29  --   GLUCOSE 127* 90  --  95  --   BUN 26* 33*  --  13  --   CREATININE 3.75* 3.61*  --  1.57*  --   CALCIUM 8.1* 7.9*  --  7.8*  --   MG  --   --   --   --  1.6*  PHOS  --   --  2.2*  --   --     Liver Function Tests: Recent Labs  Lab 10/17/19 0250  AST 18  ALT 14  ALKPHOS 131*  BILITOT 0.9  PROT 5.2*  ALBUMIN 2.4*   Recent Labs  Lab 10/16/19 1652  LIPASE 23   No results for input(s): AMMONIA in the last 168 hours.  CBC: Recent Labs  Lab 10/17/19 0250 10/19/19 0714  WBC 11.7* 5.0  NEUTROABS  --  3.8  HGB 9.8* 8.9*  HCT 29.6* 26.9*  MCV 102.8* 103.9*  PLT 198 157    Cardiac Enzymes: No results for input(s): CKTOTAL, TROPONINI in the last 168 hours.  BNP: Invalid input(s): POCBNP  CBG: Recent Labs  Lab 10/18/19 1949 10/19/19 0019 10/19/19 0444 10/19/19 0805 10/19/19 1149  GLUCAP 195* 73 98 95 82    Microbiology: Recent Results (from the past 720 hour(s))  SARS CORONAVIRUS 2 (TAT 6-24 HRS) Nasopharyngeal Nasopharyngeal Swab     Status: None   Collection Time: 10/16/19  9:18 PM   Specimen: Nasopharyngeal Swab  Result Value  Ref Range Status   SARS Coronavirus 2 NEGATIVE NEGATIVE Final     Comment: (NOTE) SARS-CoV-2 target nucleic acids are NOT DETECTED. The SARS-CoV-2 RNA is generally detectable in upper and lower respiratory specimens during the acute phase of infection. Negative results do not preclude SARS-CoV-2 infection, do not rule out co-infections with other pathogens, and should not be used as the sole basis for treatment or other patient management decisions. Negative results must be combined with clinical observations, patient history, and epidemiological information. The expected result is Negative. Fact Sheet for Patients: SugarRoll.be Fact Sheet for Healthcare Providers: https://www.woods-mathews.com/ This test is not yet approved or cleared by the [place] FDA and  has been authorized for detection and/or diagnosis of SARS-CoV-2 by FDA under an Emergency Use Authorization (EUA). This EUA will remain  in effect (meaning this test can be used) for the duration of the COVID-19 declaration under Section 56 4(b)(1) of the Act, 21 U.S.C. section 360bbb-3(b)(1), unless the authorization is terminated or revoked sooner. Performed at [facility], [place] [address], [place], [place] [address]   C difficile quick scan w PCR reflex     Status: None   Collection Time: 10/18/19  7:48 AM   Specimen: STOOL  Result Value Ref Range Status   C Diff antigen NEGATIVE NEGATIVE Final   C Diff toxin NEGATIVE NEGATIVE Final   C Diff interpretation No C. difficile detected.  Final    Comment: Performed at [facility], [address] [place], [place] [address]     Coagulation Studies: No results for input(s): LABPROT, INR in the last 72 hours.  Urinalysis: Recent Labs    10/16/19 1652  COLORURINE YELLOW*  LABSPEC 1.017  PHURINE 5.0  GLUCOSEU NEGATIVE  HGBUR NEGATIVE  BILIRUBINUR NEGATIVE  KETONESUR NEGATIVE  PROTEINUR NEGATIVE  NITRITE NEGATIVE  LEUKOCYTESUR NEGATIVE      Imaging: No results  found.   Assessment & Plan: Pt is a 69 y.o. Caucasian   female with  medical problems of ESRD, on hemodialysis, CAD/CABG 03/2011, COPD, atrial fibrillation, hypertension, COPD, chronic pancreatitis and chronic abdominal pain requiring recurrent hospitalizations , h/o AAA, cholecystectomy, severe peripheral arterial disease including bilateral renal artery stent, subclavian artery stenosis, left, left CEA was admitted on 10/16/2019 with abdominal pain, diarrhea Acute pancreatitis [K85.90] Acute on chronic pancreatitis ([place]) [K85.90, K86.1]   [facility]/TTS/left arm AV fistula/CK [place]  #End-stage renal disease plan for HD tomorrow 17 G/BFR 350 Small infiltration. Warm compress    #Acute pancreatitis Tolerating clears Management and pain control as per hospitalist team  #Anemia of chronic kidney disease Lab Results  Component Value Date   HGB 8.9 (L) 10/19/2019    Epogen with hemodialysis    LOS: 3 Drewey Begue 2/12/20212:37 PM    Note: This note was prepared with Dragon dictation. Any transcription errors are unintentional

## 2019-10-20 LAB — GLUCOSE, CAPILLARY
Glucose-Capillary: 102 mg/dL — ABNORMAL HIGH (ref 70–99)
Glucose-Capillary: 112 mg/dL — ABNORMAL HIGH (ref 70–99)
Glucose-Capillary: 163 mg/dL — ABNORMAL HIGH (ref 70–99)
Glucose-Capillary: 204 mg/dL — ABNORMAL HIGH (ref 70–99)
Glucose-Capillary: 241 mg/dL — ABNORMAL HIGH (ref 70–99)
Glucose-Capillary: 74 mg/dL (ref 70–99)
Glucose-Capillary: 90 mg/dL (ref 70–99)
Glucose-Capillary: 93 mg/dL (ref 70–99)

## 2019-10-20 LAB — PHOSPHORUS: Phosphorus: 4.2 mg/dL (ref 2.5–4.6)

## 2019-10-20 MED ORDER — PANTOPRAZOLE SODIUM 40 MG PO TBEC
40.0000 mg | DELAYED_RELEASE_TABLET | Freq: Every day | ORAL | Status: DC
  Administered 2019-10-20 – 2019-10-24 (×5): 40 mg via ORAL
  Filled 2019-10-20 (×5): qty 1

## 2019-10-20 MED ORDER — HYDROMORPHONE HCL 2 MG PO TABS
4.0000 mg | ORAL_TABLET | Freq: Once | ORAL | Status: AC
  Administered 2019-10-20: 17:00:00 4 mg via ORAL
  Filled 2019-10-20: qty 2

## 2019-10-20 MED ORDER — DEXTROSE 50 % IV SOLN: 12.5000 g | Freq: Once | INTRAVENOUS | Status: DC

## 2019-10-20 NOTE — Progress Notes (Signed)
This note also relates to the following rows which could not be included: Pulse Rate - Cannot attach notes to unvalidated device data Resp - Cannot attach notes to unvalidated device data BP - Cannot attach notes to unvalidated device data SpO2 - Cannot attach notes to unvalidated device data  Hd started  

## 2019-10-20 NOTE — Progress Notes (Signed)
[address] [place], [place] [address] Phone [phone]. Fax [phone]  Date: 10/20/2019                  Patient Name:  [name]  MRN: [id number]  DOB: 07/30/1951  Age / Sex: 69 y.o., female          Subjective: Patient seen and evaluated during hemodialysis. Tolerating well. Blood flow rate 250 with ultrafiltration target of 0.5 kg. Still having some epigastric pain.  Current medications: Current Facility-Administered Medications  Medication Dose Route Frequency Provider Last Rate Last Admin  . albuterol (PROVENTIL) (2.5 MG/3ML) 0.083% nebulizer solution 3 mL  3 mL Inhalation Q4H PRN Sreenath, Sudheer B, MD      . allopurinol (ZYLOPRIM) tablet 100 mg  100 mg Oral Daily Priscella Mann, Sudheer B, MD   100 mg at 10/19/19 0825  . amLODipine (NORVASC) tablet 5 mg  5 mg Oral QHS Murlean Iba, MD   5 mg at 10/19/19 2118  . ascorbic acid (VITAMIN C) tablet 500 mg  500 mg Oral BID Ralene Muskrat B, MD   500 mg at 10/19/19 2115  . carvedilol (COREG) tablet 25 mg  25 mg Oral BID Ralene Muskrat B, MD   25 mg at 10/19/19 2117  . Chlorhexidine Gluconate Cloth 2 % PADS 6 each  6 each Topical Q0600 Murlean Iba, MD   6 each at 10/20/19 0603  . clopidogrel (PLAVIX) tablet 75 mg  75 mg Oral Daily Ralene Muskrat B, MD   75 mg at 10/19/19 0826  . diphenhydrAMINE (BENADRYL) capsule 50 mg  50 mg Oral QHS Ralene Muskrat B, MD   50 mg at 10/19/19 2230  . epoetin alfa (EPOGEN) injection 4,000 Units  4,000 Units Intravenous Q T,Th,Sa-HD Murlean Iba, MD      . feeding supplement (BOOST / RESOURCE BREEZE) liquid 1 Container  1 Container Oral TID BM Sidney Ace, MD   1 Container at 10/19/19 2105  . feeding supplement (PRO-STAT SUGAR FREE 64) liquid 30 mL  30 mL Oral TID Ralene Muskrat B, MD   30 mL at 10/19/19 2112  . gabapentin (NEURONTIN) capsule 300 mg  300 mg Oral QHS Sreenath, Sudheer B, MD   300 mg at 10/19/19 2116  . heparin injection 5,000 Units  5,000  Units Subcutaneous Q8H Athena Masse, MD   5,000 Units at 10/19/19 1502  . hydrALAZINE (APRESOLINE) tablet 25 mg  25 mg Oral Q8H Sreenath, Sudheer B, MD   25 mg at 10/19/19 2117  . HYDROmorphone (DILAUDID) injection 1 mg  1 mg Intravenous Q2H PRN Ralene Muskrat B, MD   1 mg at 10/20/19 0842  . insulin aspart (novoLOG) injection 0-9 Units  0-9 Units Subcutaneous Q4H Athena Masse, MD   2 Units at 10/18/19 2018  . lipase/protease/amylase (CREON) capsule 24,000 Units  24,000 Units Oral TID AC Ralene Muskrat B, MD   24,000 Units at 10/19/19 1224  . loperamide (IMODIUM) capsule 2 mg  2 mg Oral PRN Ralene Muskrat B, MD   2 mg at 10/19/19 1805  . multivitamin (RENA-VIT) tablet 1 tablet  1 tablet Oral QHS Ralene Muskrat B, MD   1 tablet at 10/19/19 2127  . nicotine (NICODERM CQ - dosed in mg/24 hours) patch 21 mg  21 mg Transdermal Daily Ralene Muskrat B, MD   21 mg at 10/20/19 0850  . ondansetron (ZOFRAN) tablet 4 mg  4 mg Oral Q6H PRN Athena Masse, MD  Or  . ondansetron (ZOFRAN) injection 4 mg  4 mg Intravenous Q6H PRN Athena Masse, MD   4 mg at 10/18/19 1922  . promethazine (PHENERGAN) injection 6.25 mg  6.25 mg Intravenous Q6H PRN Athena Masse, MD   6.25 mg at 10/19/19 1721  . traZODone (DESYREL) tablet 150 mg  150 mg Oral QHS Sreenath, Sudheer B, MD   150 mg at 10/19/19 2230     Vital Signs: Blood pressure (!) 144/98, pulse 67, temperature 99.4 F (37.4 C), temperature source Oral, resp. rate 12, height 5\' 5"  (1.651 m), weight 44 kg, SpO2 100 %.   Intake/Output Summary (Last 24 hours) at 10/20/2019 1000 Last data filed at 10/20/2019 0500 Gross per 24 hour  Intake 480 ml  Output 175 ml  Net 305 ml    Weight trends: [employer]   10/16/19 1647 10/17/19 0024  Weight: 44 kg 44 kg    Physical Exam: General:  Thin, frail, elderly female, laying in the bed  HEENT  anicteric, moist oral mucous membranes  Neck:  Supple, no mass  Lungs:  normal breathing  effort, clear to auscultation  Heart::  No rub or gallop, irregular rhythm  Abdomen:  Soft, mild midepigastric tenderness  Extremities:  No peripheral edema  Neurologic:  Alert, oriented, able to answer questions  Skin:  Warm, dry  Access:  Left arm AV fistula    Lab results: Basic Metabolic Panel: Recent Labs  Lab 10/16/19 1652 10/17/19 0250 10/18/19 1100 10/19/19 0709 10/19/19 0714 10/20/19 0441  NA 134* 136  --  138  --   --   K 4.5 4.3  --  3.8  --   --   CL 96* 100  --  102  --   --   CO2 24 26  --  29  --   --   GLUCOSE 127* 90  --  95  --   --   BUN 26* 33*  --  13  --   --   CREATININE 3.75* 3.61*  --  1.57*  --   --   CALCIUM 8.1* 7.9*  --  7.8*  --   --   MG  --   --   --   --  1.6*  --   PHOS  --   --  2.2*  --   --  4.2    Liver Function Tests: Recent Labs  Lab 10/17/19 0250  AST 18  ALT 14  ALKPHOS 131*  BILITOT 0.9  PROT 5.2*  ALBUMIN 2.4*   Recent Labs  Lab 10/16/19 1652  LIPASE 23   No results for input(s): AMMONIA in the last 168 hours.  CBC: Recent Labs  Lab 10/17/19 0250 10/19/19 0714  WBC 11.7* 5.0  NEUTROABS  --  3.8  HGB 9.8* 8.9*  HCT 29.6* 26.9*  MCV 102.8* 103.9*  PLT 198 157    Cardiac Enzymes: No results for input(s): CKTOTAL, TROPONINI in the last 168 hours.  BNP: Invalid input(s): POCBNP  CBG: Recent Labs  Lab 10/19/19 1621 10/19/19 2030 10/19/19 2348 10/20/19 0401 10/20/19 0730  GLUCAP 112* 90 204* 102* 74    Microbiology: Recent Results (from the past 720 hour(s))  SARS CORONAVIRUS 2 (TAT 6-24 HRS) Nasopharyngeal Nasopharyngeal Swab     Status: None   Collection Time: 10/16/19  9:18 PM   Specimen: Nasopharyngeal Swab  Result Value Ref Range Status   SARS Coronavirus 2 NEGATIVE NEGATIVE Final    Comment: (  NOTE) SARS-CoV-2 target nucleic acids are NOT DETECTED. The SARS-CoV-2 RNA is generally detectable in upper and lower respiratory specimens during the acute phase of infection. Negative results do  not preclude SARS-CoV-2 infection, do not rule out co-infections with other pathogens, and should not be used as the sole basis for treatment or other patient management decisions. Negative results must be combined with clinical observations, patient history, and epidemiological information. The expected result is Negative. Fact Sheet for Patients: SugarRoll.be Fact Sheet for Healthcare Providers: https://www.woods-mathews.com/ This test is not yet approved or cleared by the [place] FDA and  has been authorized for detection and/or diagnosis of SARS-CoV-2 by FDA under an Emergency Use Authorization (EUA). This EUA will remain  in effect (meaning this test can be used) for the duration of the COVID-19 declaration under Section 56 4(b)(1) of the Act, 21 U.S.C. section 360bbb-3(b)(1), unless the authorization is terminated or revoked sooner. Performed at [facility], [place] [address], [place], [place] [address]   C difficile quick scan w PCR reflex     Status: None   Collection Time: 10/18/19  7:48 AM   Specimen: STOOL  Result Value Ref Range Status   C Diff antigen NEGATIVE NEGATIVE Final   C Diff toxin NEGATIVE NEGATIVE Final   C Diff interpretation No C. difficile detected.  Final    Comment: Performed at [facility], [address] [place], [place] [address]     Coagulation Studies: No results for input(s): LABPROT, INR in the last 72 hours.  Urinalysis: No results for input(s): COLORURINE, LABSPEC, PHURINE, GLUCOSEU, HGBUR, BILIRUBINUR, KETONESUR, PROTEINUR, UROBILINOGEN, NITRITE, LEUKOCYTESUR in the last 72 hours.  Invalid input(s): APPERANCEUR    Imaging: No results found.   Assessment & Plan: Pt is a 69 y.o. Caucasian   female with  medical problems of ESRD, on hemodialysis, CAD/CABG 03/2011, COPD, atrial fibrillation, hypertension, COPD, chronic pancreatitis and chronic abdominal pain requiring recurrent  hospitalizations , h/o AAA, cholecystectomy, severe peripheral arterial disease including bilateral renal artery stent, subclavian artery stenosis, left, left CEA was admitted on 10/16/2019 with abdominal pain, diarrhea Acute pancreatitis [K85.90] Acute on chronic pancreatitis ([place]) [K85.90, K86.1]   [facility]/TTS/left arm AV fistula/CK [place]  #End-stage renal disease Patient seen and evaluated during hemodialysis.  Blood flow rate to 50 with ultrafiltration target of 0.5 kg.  Continue to monitor progress during dialysis treatment.   #Acute pancreatitis Transition to full liquid diet.  Still having some mild epigastric pain.  Management as per hospitalist otherwise.  #Anemia of chronic kidney disease Lab Results  Component Value Date   HGB 8.9 (L) 10/19/2019  Administer Epogen with dialysis treatment today.    LOS: 4 Edmond Ginsberg 2/13/202110:00 AM    Note: This note was prepared with Dragon dictation. Any transcription errors are unintentional

## 2019-10-20 NOTE — Progress Notes (Signed)
CBG 92. Did not cross over from glucometer

## 2019-10-20 NOTE — Progress Notes (Signed)
CBG 102, did not cross over from Glucometer box 2.

## 2019-10-20 NOTE — Plan of Care (Signed)

## 2019-10-20 NOTE — Progress Notes (Signed)
PROGRESS NOTE    [name]  EXB:[id number] DOB: 01-03-1951 DOA: 10/16/2019 PCP: Bernerd Limbo, MD    Brief Narrative:  HPI: [name] is a 69 y.o. female with medical history significant for ESRD on HD TTS, CAD status post CABG, COPD, A. fib, hypertension, COPD and chronic pancreatitis with chronic pain on Creon and oral Dilaudid with recurrent hospitalizations for the same, most recently November 2020, who presents to the emergency room with a 3-day history of nausea and abdominal pain typical for her acute pancreatitis flares.  Abdominal pain is mostly in the epigastrium of severe intensity and nonradiating, aggravated by attempting to eat or drink.  She did vomit once on the day of arrival.  Also states she has been having a loose explosive bowel movement daily.  No history of recent antibiotic use.  She denies fever, chills, cough, chest pain and shortness of breath.'  2/10: Patient seen and evaluated.  Abdominal pain still present.  Endorses mild improvement over interval.  Continues to require IV narcotic.  No vomiting noted over interval.  2/11: Patient seen and evaluated.  Still with abdominal pain.  Tolerating small amounts of clear liquids yesterday.  Continues to require IV narcotic.  Nausea improved over interval.  2/12: Patient seen and evaluated.  Continues with abdominal pain.  Was very upset yesterday.  Patient stated that her narcotics were switched to oral overnight.  No reasoning was provided to patient.  Patient had 7 episodes of watery stool yesterday.  Resolved with Imodium.  Very low suspicion for infectious diarrhea.  Patient is opioid tolerant and continuing to require fairly frequent IV narcotic administration.  2/13: Patient seen and evaluated.  Attempted to advance to soft diet for dinner yesterday.  Patient did not tolerate.  Had acute onset nausea and worsening abdominal pain.  Reverted back to full liquids which patient appears to be tolerating well.  Diarrhea  essentially resolved.  GI PCR panel still pending.  Assessment & Plan:   Principal Problem:   Acute on chronic pancreatitis (HCC) Active Problems:   Essential hypertension   Atrial fibrillation (HCC)   COPD with chronic bronchitis (HCC)   S/P CABG x 3   AAA (abdominal aortic aneurysm) without rupture (HCC)   Chronic diastolic congestive heart failure (HCC)   Type II diabetes mellitus with renal manifestations (HCC)   Recurrent pancreatitis   Chronic pain   ESRD on hemodialysis (HCC)   Acute pancreatitis  Acute on chronic pancreatitis (HCC)   Recurrent pancreatitis   Chronic pain -Lipase was normal -CT abdomen and pelvis showed findings consistent with acute on chronic pancreatitis involving pancreatic head and proximal body with adjacent resultant duodenitis. --Still with significant belly pain and anorexia Plan: Continue IV narcotic administration for now.  Please do not discontinue or de-escalate unless there is a specific documented reason to do so Hold IV fluids at this time considering ESRD Advance as tolerated to soft diet IV PPI Anti-emetics Continuing home Creon  Diarrhea, improved x 2 weeks Suspect chronic diarrhea in the setting of acute on chronic pancreatitis Stool studies sent, pending results Clinical suspicion for infectious diarrhea is low C. difficile negative GI PCR panel still pending however clinical suspicion for infectious diarrhea low As needed Imodium ordered with good result    ESRD on hemodialysis [facility]) -Patient missed dialysis session on the day of arrival but stable and not in need of urgent dialysis based on symptoms and lab work -Nephrology consult for continuation of dialysis Plan: Nephrology  consult appreciated Inpatient hemodialysis 2/11    Essential hypertension Nausea improved, can restart home medications Restart Coreg 25 mg twice daily Restart amlodipine 5 mg daily Hydralazine, 25 mg every 8 hours     Atrial fibrillation  (HCC) Resume carvedilol Metoprolol IV for sustained tachycardia Patient not on prophylactic anticoagulation    S/P CABG x 3   AAA (abdominal aortic aneurysm) without rupture (HCC)   Chronic diastolic congestive heart failure ([place]) Resume home meds as above Resume Plavix Monitor volume status Telemetry    Type II diabetes mellitus with renal manifestations (HCC) -Sliding scale insulin coverage   DVT prophylaxis: Subcu heparin Code Status: Full Family Communication: Spoke to daughter, 10/19/2019 Disposition Plan: Anticipate home after resolution of acute pancreatitis symptoms   Consultants:   Nephrology  Procedures:   None  Antimicrobials:   None   Subjective: Patient seen and examined Persistent abdominal pain Nausea improved Wants to try soft diet today  Objective: Vitals:   10/19/19 2258 10/20/19 0601 10/20/19 0736 10/20/19 0945  BP: 138/71 127/60 138/65 (!) 144/98  Pulse: 71  63 67  Resp: 16  17 12   Temp: 98.9 F (37.2 C)  99 F (37.2 C) 99.4 F (37.4 C)  TempSrc: Oral  Oral Oral  SpO2: 97%  98% 100%  Weight:      Height:        Intake/Output Summary (Last 24 hours) at 10/20/2019 0953 Last data filed at 10/20/2019 0500 Gross per 24 hour  Intake 480 ml  Output 175 ml  Net 305 ml   Filed Weights   10/16/19 1647 10/17/19 0024  Weight: 44 kg 44 kg    Examination:  General exam: Appears calm and comfortable  Respiratory system: Clear to auscultation. Respiratory effort normal. Cardiovascular system: S1 & S2 heard, RRR. No JVD, murmurs, rubs, gallops or clicks. No pedal edema. Gastrointestinal system: Nondistended, tender to palpation in epigastrium, normal bowel sounds  Central nervous system: Alert and oriented. No focal neurological deficits. Extremities: Symmetric 5 x 5 power. Skin: No rashes, lesions or ulcers Psychiatry: Judgement and insight appear normal. Mood & affect appropriate.     Data Reviewed: I have personally reviewed  following labs and imaging studies  CBC: Recent Labs  Lab 10/16/19 1652 10/17/19 0250 10/19/19 0714  WBC 16.0* 11.7* 5.0  NEUTROABS  --   --  3.8  HGB 10.6* 9.8* 8.9*  HCT 32.4* 29.6* 26.9*  MCV 102.2* 102.8* 103.9*  PLT 219 198 025   Basic Metabolic Panel: Recent Labs  Lab 10/16/19 1652 10/17/19 0250 10/18/19 1100 10/19/19 0709 10/19/19 0714 10/20/19 0441  NA 134* 136  --  138  --   --   K 4.5 4.3  --  3.8  --   --   CL 96* 100  --  102  --   --   CO2 24 26  --  29  --   --   GLUCOSE 127* 90  --  95  --   --   BUN 26* 33*  --  13  --   --   CREATININE 3.75* 3.61*  --  1.57*  --   --   CALCIUM 8.1* 7.9*  --  7.8*  --   --   MG  --   --   --   --  1.6*  --   PHOS  --   --  2.2*  --   --  4.2   GFR: Estimated Creatinine Clearance: 24.5 mL/min (  A) (by C-G formula based on SCr of 1.57 mg/dL (H)). Liver Function Tests: Recent Labs  Lab 10/16/19 1652 10/17/19 0250  AST 20 18  ALT 15 14  ALKPHOS 152* 131*  BILITOT 0.9 0.9  PROT 5.2* 5.2*  ALBUMIN 2.4* 2.4*   Recent Labs  Lab 10/16/19 1652  LIPASE 23   No results for input(s): AMMONIA in the last 168 hours. Coagulation Profile: No results for input(s): INR, PROTIME in the last 168 hours. Cardiac Enzymes: No results for input(s): CKTOTAL, CKMB, CKMBINDEX, TROPONINI in the last 168 hours. BNP (last 3 results) No results for input(s): PROBNP in the last 8760 hours. HbA1C: No results for input(s): HGBA1C in the last 72 hours. CBG: Recent Labs  Lab 10/19/19 1621 10/19/19 2030 10/19/19 2348 10/20/19 0401 10/20/19 0730  GLUCAP 112* 90 204* 102* 74   Lipid Profile: No results for input(s): CHOL, HDL, LDLCALC, TRIG, CHOLHDL, LDLDIRECT in the last 72 hours. Thyroid Function Tests: No results for input(s): TSH, T4TOTAL, FREET4, T3FREE, THYROIDAB in the last 72 hours. Anemia Panel: No results for input(s): VITAMINB12, FOLATE, FERRITIN, TIBC, IRON, RETICCTPCT in the last 72 hours. Sepsis Labs: No results for  input(s): PROCALCITON, LATICACIDVEN in the last 168 hours.  Recent Results (from the past 240 hour(s))  SARS CORONAVIRUS 2 (TAT 6-24 HRS) Nasopharyngeal Nasopharyngeal Swab     Status: None   Collection Time: 10/16/19  9:18 PM   Specimen: Nasopharyngeal Swab  Result Value Ref Range Status   SARS Coronavirus 2 NEGATIVE NEGATIVE Final    Comment: (NOTE) SARS-CoV-2 target nucleic acids are NOT DETECTED. The SARS-CoV-2 RNA is generally detectable in upper and lower respiratory specimens during the acute phase of infection. Negative results do not preclude SARS-CoV-2 infection, do not rule out co-infections with other pathogens, and should not be used as the sole basis for treatment or other patient management decisions. Negative results must be combined with clinical observations, patient history, and epidemiological information. The expected result is Negative. Fact Sheet for Patients: SugarRoll.be Fact Sheet for Healthcare Providers: https://www.woods-mathews.com/ This test is not yet approved or cleared by the [place] FDA and  has been authorized for detection and/or diagnosis of SARS-CoV-2 by FDA under an Emergency Use Authorization (EUA). This EUA will remain  in effect (meaning this test can be used) for the duration of the COVID-19 declaration under Section 56 4(b)(1) of the Act, 21 U.S.C. section 360bbb-3(b)(1), unless the authorization is terminated or revoked sooner. Performed at [facility], [place] [address], [place], [place] [address]   C difficile quick scan w PCR reflex     Status: None   Collection Time: 10/18/19  7:48 AM   Specimen: STOOL  Result Value Ref Range Status   C Diff antigen NEGATIVE NEGATIVE Final   C Diff toxin NEGATIVE NEGATIVE Final   C Diff interpretation No C. difficile detected.  Final    Comment: Performed at [facility], [address] [place], [place] [address]          Radiology Studies: No results found.      Scheduled Meds: . allopurinol  100 mg Oral Daily  . amLODipine  5 mg Oral QHS  . vitamin C  500 mg Oral BID  . carvedilol  25 mg Oral BID  . Chlorhexidine Gluconate Cloth  6 each Topical Q0600  . clopidogrel  75 mg Oral Daily  . diphenhydrAMINE  50 mg Oral QHS  . epoetin (EPOGEN/PROCRIT) injection  4,000 Units Intravenous  Q T,Th,Sa-HD  . feeding supplement  1 Container Oral TID BM  . feeding supplement (PRO-STAT SUGAR FREE 64)  30 mL Oral TID  . gabapentin  300 mg Oral QHS  . heparin injection (subcutaneous)  5,000 Units Subcutaneous Q8H  . hydrALAZINE  25 mg Oral Q8H  . insulin aspart  0-9 Units Subcutaneous Q4H  . lipase/protease/amylase  24,000 Units Oral TID AC  . multivitamin  1 tablet Oral QHS  . nicotine  21 mg Transdermal Daily  . traZODone  150 mg Oral QHS   Continuous Infusions:   LOS: 4 days    Time spent: 35 minutes    Sidney Ace, MD [facility] Pager [phone]  If 7PM-7AM, please contact night-coverage  10/20/2019, 9:53 AM

## 2019-10-20 NOTE — Progress Notes (Signed)
IV infiltrated. Pt upset, wanting pain medication now. C/O pain level 9/10. Pt. stating "Oral pain medication does not work, that is why I am here." Iv consult order in place.  Dilaudid on hold.

## 2019-10-20 NOTE — Progress Notes (Signed)
This note also relates to the following rows which could not be included: Pulse Rate - Cannot attach notes to unvalidated device data Resp - Cannot attach notes to unvalidated device data  Hd completed  

## 2019-10-21 ENCOUNTER — Inpatient Hospital Stay: Payer: Medicare Other

## 2019-10-21 LAB — BASIC METABOLIC PANEL
Anion gap: 7 (ref 5–15)
BUN: 11 mg/dL (ref 8–23)
CO2: 29 mmol/L (ref 22–32)
Calcium: 7.8 mg/dL — ABNORMAL LOW (ref 8.9–10.3)
Chloride: 101 mmol/L (ref 98–111)
Creatinine, Ser: 1.46 mg/dL — ABNORMAL HIGH (ref 0.44–1.00)
GFR calc Af Amer: 43 mL/min — ABNORMAL LOW (ref >60)
GFR calc non Af Amer: 37 mL/min — ABNORMAL LOW (ref >60)
Glucose, Bld: 86 mg/dL (ref 70–99)
Potassium: 3.7 mmol/L (ref 3.5–5.1)
Sodium: 137 mmol/L (ref 135–145)

## 2019-10-21 LAB — CBC WITH DIFFERENTIAL/PLATELET
Abs Immature Granulocytes: 0.09 10*3/uL — ABNORMAL HIGH (ref 0.00–0.07)
Basophils Absolute: 0.1 10*3/uL (ref 0.0–0.1)
Basophils Relative: 1 %
Eosinophils Absolute: 0.3 10*3/uL (ref 0.0–0.5)
Eosinophils Relative: 4 %
HCT: 27.9 % — ABNORMAL LOW (ref 36.0–46.0)
Hemoglobin: 8.9 g/dL — ABNORMAL LOW (ref 12.0–15.0)
Immature Granulocytes: 2 %
Lymphocytes Relative: 16 %
Lymphs Abs: 1 10*3/uL (ref 0.7–4.0)
MCH: 33.8 pg (ref 26.0–34.0)
MCHC: 31.9 g/dL (ref 30.0–36.0)
MCV: 106.1 fL — ABNORMAL HIGH (ref 80.0–100.0)
Monocytes Absolute: 0.7 10*3/uL (ref 0.1–1.0)
Monocytes Relative: 12 %
Neutro Abs: 4 10*3/uL (ref 1.7–7.7)
Neutrophils Relative %: 65 %
Platelets: 167 10*3/uL (ref 150–400)
RBC: 2.63 MIL/uL — ABNORMAL LOW (ref 3.87–5.11)
RDW: 15.9 % — ABNORMAL HIGH (ref 11.5–15.5)
WBC: 6.1 10*3/uL (ref 4.0–10.5)
nRBC: 0 % (ref 0.0–0.2)

## 2019-10-21 LAB — GLUCOSE, CAPILLARY
Glucose-Capillary: 58 mg/dL — ABNORMAL LOW (ref 70–99)
Glucose-Capillary: 90 mg/dL (ref 70–99)
Glucose-Capillary: 120 mg/dL — ABNORMAL HIGH (ref 70–99)
Glucose-Capillary: 76 mg/dL (ref 70–99)
Glucose-Capillary: 104 mg/dL — ABNORMAL HIGH (ref 70–99)
Glucose-Capillary: 113 mg/dL — ABNORMAL HIGH (ref 70–99)
Glucose-Capillary: 126 mg/dL — ABNORMAL HIGH (ref 70–99)

## 2019-10-21 LAB — MAGNESIUM: Magnesium: 1.4 mg/dL — ABNORMAL LOW (ref 1.7–2.4)

## 2019-10-21 MED ORDER — AMLODIPINE BESYLATE 10 MG PO TABS
10.0000 mg | ORAL_TABLET | Freq: Every day | ORAL | Status: DC
  Administered 2019-10-21 – 2019-10-24 (×4): 10 mg via ORAL
  Filled 2019-10-21 (×4): qty 1

## 2019-10-21 MED ORDER — HYDROMORPHONE HCL 2 MG PO TABS
2.0000 mg | ORAL_TABLET | ORAL | Status: DC | PRN
  Filled 2019-10-21: qty 1

## 2019-10-21 MED ORDER — HYDROMORPHONE HCL 1 MG/ML IJ SOLN
1.5000 mg | INTRAMUSCULAR | Status: DC | PRN
  Administered 2019-10-21 – 2019-10-22 (×6): 1.5 mg via INTRAVENOUS
  Filled 2019-10-21 (×6): qty 2

## 2019-10-21 MED ORDER — AMLODIPINE BESYLATE 5 MG PO TABS
5.0000 mg | ORAL_TABLET | Freq: Every day | ORAL | Status: DC
  Administered 2019-10-21 – 2019-10-22 (×2): 5 mg via ORAL
  Filled 2019-10-21 (×2): qty 1

## 2019-10-21 NOTE — Progress Notes (Signed)
[address] [place], [place] [address] Phone [phone]. Fax [phone]  Date: 10/21/2019                  Patient Name:  ADDI PAK  MRN: [id number]  DOB: June 15, 1951  Age / Sex: 69 y.o., female          Subjective: Patient completed dialysis yesterday. Tolerated well. Still having some nausea as well as abdominal pain.  Current medications: Current Facility-Administered Medications  Medication Dose Route Frequency Provider Last Rate Last Admin  . albuterol (PROVENTIL) (2.5 MG/3ML) 0.083% nebulizer solution 3 mL  3 mL Inhalation Q4H PRN Sreenath, Sudheer B, MD      . allopurinol (ZYLOPRIM) tablet 100 mg  100 mg Oral Daily Priscella Mann, Sudheer B, MD   100 mg at 10/21/19 1033  . amLODipine (NORVASC) tablet 10 mg  10 mg Oral QHS Sreenath, Sudheer B, MD      . amLODipine (NORVASC) tablet 5 mg  5 mg Oral Daily Priscella Mann, Sudheer B, MD   5 mg at 10/21/19 1033  . ascorbic acid (VITAMIN C) tablet 500 mg  500 mg Oral BID Ralene Muskrat B, MD   500 mg at 10/21/19 0845  . carvedilol (COREG) tablet 25 mg  25 mg Oral BID Ralene Muskrat B, MD   25 mg at 10/21/19 1033  . Chlorhexidine Gluconate Cloth 2 % PADS 6 each  6 each Topical Q0600 Murlean Iba, MD   6 each at 10/20/19 0603  . clopidogrel (PLAVIX) tablet 75 mg  75 mg Oral Daily Ralene Muskrat B, MD   75 mg at 10/21/19 1034  . dextrose 50 % solution 12.5 g  12.5 g Intravenous Once Sharion Settler, NP      . diphenhydrAMINE (BENADRYL) capsule 50 mg  50 mg Oral QHS Ralene Muskrat B, MD   50 mg at 10/19/19 2230  . epoetin alfa (EPOGEN) injection 4,000 Units  4,000 Units Intravenous Q T,Th,Sa-HD Murlean Iba, MD   4,000 Units at 10/20/19 1001  . feeding supplement (BOOST / RESOURCE BREEZE) liquid 1 Container  1 Container Oral TID BM Sidney Ace, MD   1 Container at 10/21/19 1417  . feeding supplement (PRO-STAT SUGAR FREE 64) liquid 30 mL  30 mL Oral TID Ralene Muskrat B, MD   30 mL at 10/21/19 1625  .  gabapentin (NEURONTIN) capsule 300 mg  300 mg Oral QHS Sreenath, Sudheer B, MD   300 mg at 10/20/19 2030  . heparin injection 5,000 Units  5,000 Units Subcutaneous Q8H Athena Masse, MD   5,000 Units at 10/21/19 1413  . hydrALAZINE (APRESOLINE) tablet 25 mg  25 mg Oral Q8H Sreenath, Sudheer B, MD   25 mg at 10/21/19 1413  . HYDROmorphone (DILAUDID) injection 1.5 mg  1.5 mg Intravenous Q3H PRN Ralene Muskrat B, MD   1.5 mg at 10/21/19 1712  . HYDROmorphone (DILAUDID) tablet 2 mg  2 mg Oral Q4H PRN Sreenath, Sudheer B, MD      . insulin aspart (novoLOG) injection 0-9 Units  0-9 Units Subcutaneous Q4H Athena Masse, MD   3 Units at 10/20/19 1716  . lipase/protease/amylase (CREON) capsule 24,000 Units  24,000 Units Oral TID AC Ralene Muskrat B, MD   24,000 Units at 10/21/19 1713  . loperamide (IMODIUM) capsule 2 mg  2 mg Oral PRN Ralene Muskrat B, MD   2 mg at 10/19/19 1805  . multivitamin (RENA-VIT) tablet 1 tablet  1 tablet Oral QHS Sreenath, Sudheer B,  MD   1 tablet at 10/20/19 2030  . nicotine (NICODERM CQ - dosed in mg/24 hours) patch 21 mg  21 mg Transdermal Daily Ralene Muskrat B, MD   21 mg at 10/21/19 1033  . ondansetron (ZOFRAN) tablet 4 mg  4 mg Oral Q6H PRN Athena Masse, MD       Or  . ondansetron [facility]) injection 4 mg  4 mg Intravenous Q6H PRN Athena Masse, MD   4 mg at 10/18/19 1922  . pantoprazole (PROTONIX) EC tablet 40 mg  40 mg Oral Daily Ralene Muskrat B, MD   40 mg at 10/21/19 1033  . promethazine (PHENERGAN) injection 6.25 mg  6.25 mg Intravenous Q6H PRN Athena Masse, MD   6.25 mg at 10/20/19 2340  . traZODone (DESYREL) tablet 150 mg  150 mg Oral QHS Sreenath, Sudheer B, MD   150 mg at 10/19/19 2230     Vital Signs: Blood pressure (!) 146/65, pulse 64, temperature 98.2 F (36.8 C), temperature source Oral, resp. rate 15, height 5\' 5"  (1.651 m), weight 44 kg, SpO2 99 %.   Intake/Output Summary (Last 24 hours) at 10/21/2019 1753 Last data filed at  10/21/2019 1300 Gross per 24 hour  Intake 240 ml  Output 300 ml  Net -60 ml    Weight trends: Filed Weights   10/16/19 1647 10/17/19 0024  Weight: 44 kg 44 kg    Physical Exam: General:  Thin, frail, elderly female, laying in the bed  HEENT  anicteric, moist oral mucous membranes  Neck:  Supple, no mass  Lungs:  normal breathing effort, clear to auscultation  Heart::  No rub or gallop, irregular rhythm  Abdomen:  Soft, mild midepigastric tenderness  Extremities:  No peripheral edema  Neurologic:  Alert, oriented, able to answer questions  Skin:  Warm, dry  Access:  Left arm AV fistula    Lab results: Basic Metabolic Panel: Recent Labs  Lab 10/17/19 0250 10/18/19 1100 10/19/19 0709 10/19/19 0714 10/20/19 0441 10/21/19 0808  NA 136  --  138  --   --  137  K 4.3  --  3.8  --   --  3.7  CL 100  --  102  --   --  101  CO2 26  --  29  --   --  29  GLUCOSE 90  --  95  --   --  86  BUN 33*  --  13  --   --  11  CREATININE 3.61*  --  1.57*  --   --  1.46*  CALCIUM 7.9*  --  7.8*  --   --  7.8*  MG  --   --   --  1.6*  --  1.4*  PHOS  --  2.2*  --   --  4.2  --     Liver Function Tests: Recent Labs  Lab 10/17/19 0250  AST 18  ALT 14  ALKPHOS 131*  BILITOT 0.9  PROT 5.2*  ALBUMIN 2.4*   Recent Labs  Lab 10/16/19 1652  LIPASE 23   No results for input(s): AMMONIA in the last 168 hours.  CBC: Recent Labs  Lab 10/19/19 0714 10/21/19 0808  WBC 5.0 6.1  NEUTROABS 3.8 4.0  HGB 8.9* 8.9*  HCT 26.9* 27.9*  MCV 103.9* 106.1*  PLT 157 167    Cardiac Enzymes: No results for input(s): CKTOTAL, TROPONINI in the last 168 hours.  BNP: Invalid input(s): POCBNP  CBG: Recent Labs  Lab 10/21/19 0254 10/21/19 0404 10/21/19 0807 10/21/19 1154 10/21/19 1702  GLUCAP 90 120* 76 104* 113*    Microbiology: Recent Results (from the past 720 hour(s))  SARS CORONAVIRUS 2 (TAT 6-24 HRS) Nasopharyngeal Nasopharyngeal Swab     Status: None   Collection Time:  10/16/19  9:18 PM   Specimen: Nasopharyngeal Swab  Result Value Ref Range Status   SARS Coronavirus 2 NEGATIVE NEGATIVE Final    Comment: (NOTE) SARS-CoV-2 target nucleic acids are NOT DETECTED. The SARS-CoV-2 RNA is generally detectable in upper and lower respiratory specimens during the acute phase of infection. Negative results do not preclude SARS-CoV-2 infection, do not rule out co-infections with other pathogens, and should not be used as the sole basis for treatment or other patient management decisions. Negative results must be combined with clinical observations, patient history, and epidemiological information. The expected result is Negative. Fact Sheet for Patients: SugarRoll.be Fact Sheet for Healthcare Providers: https://www.woods-mathews.com/ This test is not yet approved or cleared by the [place] FDA and  has been authorized for detection and/or diagnosis of SARS-CoV-2 by FDA under an Emergency Use Authorization (EUA). This EUA will remain  in effect (meaning this test can be used) for the duration of the COVID-19 declaration under Section 56 4(b)(1) of the Act, 21 U.S.C. section 360bbb-3(b)(1), unless the authorization is terminated or revoked sooner. Performed at [facility], [place] [address], [place], [place] [address]   C difficile quick scan w PCR reflex     Status: None   Collection Time: 10/18/19  7:48 AM   Specimen: STOOL  Result Value Ref Range Status   C Diff antigen NEGATIVE NEGATIVE Final   C Diff toxin NEGATIVE NEGATIVE Final   C Diff interpretation No C. difficile detected.  Final    Comment: Performed at [facility], [place] [place],  [address]     Coagulation Studies: No results for input(s): LABPROT, INR in the last 72 hours.  Urinalysis: No results for input(s): COLORURINE, LABSPEC, PHURINE, GLUCOSEU, HGBUR, BILIRUBINUR, KETONESUR, PROTEINUR, UROBILINOGEN, NITRITE,  LEUKOCYTESUR in the last 72 hours.  Invalid input(s): APPERANCEUR    Imaging: CT ABDOMEN PELVIS WO CONTRAST  Result Date: 10/21/2019 CLINICAL DATA:  Abdominal pain with nausea, vomiting and diarrhea for 4 days. History of chronic pancreatitis. EXAM: CT ABDOMEN AND PELVIS WITHOUT CONTRAST TECHNIQUE: Multidetector CT imaging of the abdomen and pelvis was performed following the standard protocol without IV contrast. COMPARISON:  CT abdomen dated 10/16/2019. FINDINGS: Lower chest: New small RIGHT pleural effusion. Hepatobiliary: No focal mass or lesion is seen within the liver. Intrahepatic bile duct dilatation and/or periportal edema. Status post cholecystectomy. Stable appearance of the mildly distended common bile duct. No CBD stone appreciated. Pancreas: Coarse calcifications again noted within the prominent pancreatic head. Configuration the pancreatic head appears stable in the short-term interval. There is stable dilatation of the pancreatic duct within the pancreatic head. Walls of the adjacent duodenum again appear thickened, presumably reactive in nature. Spleen: Normal in size without focal abnormality. Adrenals/Urinary Tract: Adrenal glands appear normal. Bilateral renal stones. No hydronephrosis. No ureteral or bladder calculi are identified. Bladder is unremarkable, partially decompressed Stomach/Bowel: Surgical anastomosis at the rectosigmoid colon level, without obstruction, inflammation or other surgical complicating feature. No dilated large or small bowel loops. Presumed appendectomy. Stomach is unremarkable, partially decompressed. Vascular/Lymphatic: Aorta bi-iliac stents in place. 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 No periaortic hemorrhage or edema appreciated.  Bilateral renal artery stents are also in place. Heavy atherosclerotic changes are seen along the remainder of the abdominal aorta and aortic branch vessels. No enlarged lymph nodes seen in the abdomen or  pelvis. Reproductive: Presumed hysterectomy. No adnexal mass or free fluid. Other: No free fluid or abscess collection is seen. No free intraperitoneal air. Musculoskeletal: No acute or suspicious osseous finding. Degenerative spondylosis of the slightly scoliotic thoracolumbar spine, mild to moderate in degree. IMPRESSION: 1. New small RIGHT pleural effusion. 2. Stable appearance of the enlarged pancreatic head, with associated coarse calcifications and pancreatic duct dilatation, and with small amount of fluid stranding again seen within the peripancreatic soft tissues corresponding to recent episode of acute pancreatitis (acute on chronic pancreatitis). 3. Walls of the adjacent duodenum again appear thickened, likely reactive in nature. 4. Aorta bi-iliac stents in place. 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 No periaortic hemorrhage or edema appreciated. 5. Bilateral nephrolithiasis. No hydronephrosis. No ureteral or bladder calculi. 6. No bowel obstruction. No secondary signs of bowel wall ischemia. No abscess collection or free intraperitoneal air. Aortic Atherosclerosis (ICD10-I70.0). Electronically Signed   By: Franki Cabot M.D.   On: 10/21/2019 13:46     Assessment & Plan: Pt is a 69 y.o. Caucasian   female with  medical problems of ESRD, on hemodialysis, CAD/CABG 03/2011, COPD, atrial fibrillation, hypertension, COPD, chronic pancreatitis and chronic abdominal pain requiring recurrent hospitalizations , h/o AAA, cholecystectomy, severe peripheral arterial disease including bilateral renal artery stent, subclavian artery stenosis, left, left CEA was admitted on 10/16/2019 with abdominal pain, diarrhea Acute pancreatitis [K85.90] Acute on chronic pancreatitis ([place]) [K85.90, K86.1]   [facility]/TTS/left arm AV fistula/[place]  #End-stage renal disease Patient underwent dialysis treatment yesterday.  No acute indication for dialysis today.  Next scheduled  dialysis will be for Tuesday if still here.   #Acute pancreatitis Still having some mild epigastric pain as well as nausea.  Management as per hospitalist.  #Anemia of chronic kidney disease Lab Results  Component Value Date   HGB 8.9 (L) 10/21/2019  Continue Epogen with dialysis treatments.    LOS: 5 Teagan Heidrick 2/14/20215:53 PM    Note: This note was prepared with Dragon dictation. Any transcription errors are unintentional

## 2019-10-21 NOTE — Progress Notes (Signed)
PROGRESS NOTE    [name]  WIO:[id number] DOB: April 29, 1951 DOA: 10/16/2019 PCP: Bernerd Limbo, MD    Brief Narrative:  HPI: [name] is a 69 y.o. female with medical history significant for ESRD on HD TTS, CAD status post CABG, COPD, A. fib, hypertension, COPD and chronic pancreatitis with chronic pain on Creon and oral Dilaudid with recurrent hospitalizations for the same, most recently November 2020, who presents to the emergency room with a 3-day history of nausea and abdominal pain typical for her acute pancreatitis flares.  Abdominal pain is mostly in the epigastrium of severe intensity and nonradiating, aggravated by attempting to eat or drink.  She did vomit once on the day of arrival.  Also states she has been having a loose explosive bowel movement daily.  No history of recent antibiotic use.  She denies fever, chills, cough, chest pain and shortness of breath.'  2/10: Patient seen and evaluated.  Abdominal pain still present.  Endorses mild improvement over interval.  Continues to require IV narcotic.  No vomiting noted over interval.  2/11: Patient seen and evaluated.  Still with abdominal pain.  Tolerating small amounts of clear liquids yesterday.  Continues to require IV narcotic.  Nausea improved over interval.  2/12: Patient seen and evaluated.  Continues with abdominal pain.  Was very upset yesterday.  Patient stated that her narcotics were switched to oral overnight.  No reasoning was provided to patient.  Patient had 7 episodes of watery stool yesterday.  Resolved with Imodium.  Very low suspicion for infectious diarrhea.  Patient is opioid tolerant and continuing to require fairly frequent IV narcotic administration.  2/13: Patient seen and evaluated.  Attempted to advance to soft diet for dinner yesterday.  Patient did not tolerate.  Had acute onset nausea and worsening abdominal pain.  Reverted back to full liquids which patient appears to be tolerating well.  Diarrhea  essentially resolved.  GI PCR panel still pending.  2/14: Patient seen and evaluated. Is tolerating small amounts of soft diet however persistent nausea and abdominal pain. Continues to require IV narcotic fairly frequently. 1-2 loose stools yesterday. GI PCR panel still pending.  Assessment & Plan:   Principal Problem:   Acute on chronic pancreatitis (HCC) Active Problems:   Essential hypertension   Atrial fibrillation (HCC)   COPD with chronic bronchitis (HCC)   S/P CABG x 3   AAA (abdominal aortic aneurysm) without rupture (HCC)   Chronic diastolic congestive heart failure (HCC)   Type II diabetes mellitus with renal manifestations (HCC)   Recurrent pancreatitis   Chronic pain   ESRD on hemodialysis (HCC)   Acute pancreatitis  Acute on chronic pancreatitis (HCC)   Recurrent pancreatitis   Chronic pain -Lipase was normal -CT abdomen and pelvis showed findings consistent with acute on chronic pancreatitis involving pancreatic head and proximal body with adjacent resultant duodenitis. --Still with significant belly pain and anorexia Plan: Recheck CT abdomen pelvis without contrast Continue IV narcotic administration for now.  Please do not discontinue or de-escalate unless there is a specific documented reason to do so Hold IV fluids at this time considering ESRD Advance as tolerated to soft diet IV PPI Anti-emetics Continuing home Creon  Diarrhea, improved x 2 weeks Suspect chronic diarrhea in the setting of acute on chronic pancreatitis Stool studies sent, pending results Clinical suspicion for infectious diarrhea is low C. difficile negative GI PCR panel still pending however clinical suspicion for infectious diarrhea low As needed Imodium ordered with  good result    ESRD on hemodialysis [facility]) -Patient missed dialysis session on the day of arrival but stable and not in need of urgent dialysis based on symptoms and lab work -Nephrology consult for continuation of  dialysis Plan: Nephrology consult appreciated Inpatient hemodialysis 2/11    Essential hypertension Nausea improved, can restart home medications Restart Coreg 25 mg twice daily Restart amlodipine 5 mg daily Hydralazine, 25 mg every 8 hours     Atrial fibrillation ([place]) Resume carvedilol Metoprolol IV for sustained tachycardia Patient not on prophylactic anticoagulation    S/P CABG x 3   AAA (abdominal aortic aneurysm) without rupture ([place])   Chronic diastolic congestive heart failure ([place]) Resume home meds as above Resume Plavix Monitor volume status Telemetry    Type II diabetes mellitus with renal manifestations (HCC) -Sliding scale insulin coverage   DVT prophylaxis: Subcu heparin Code Status: Full Family Communication: Spoke to daughter, 10/19/2019 Disposition Plan: Anticipate home after resolution of acute pancreatitis symptoms   Consultants:   Nephrology  Procedures:   None  Antimicrobials:   None   Subjective: Patient seen and examined Persistent abdominal pain Tolerating small amounts of soft diet Still with persistent belly pain  Objective: Vitals:   10/21/19 0100 10/21/19 0424 10/21/19 0637 10/21/19 0808  BP: (!) 150/61 (!) 146/48 (!) 172/67 (!) 141/60  Pulse: 69 68 66 63  Resp: 16 16 16    Temp:    98.4 F (36.9 C)  TempSrc:    Oral  SpO2: 100% 100% 100% 96%  Weight:      Height:        Intake/Output Summary (Last 24 hours) at 10/21/2019 1123 Last data filed at 10/21/2019 1118 Gross per 24 hour  Intake --  Output 300 ml  Net -300 ml   Filed Weights   10/16/19 1647 10/17/19 0024  Weight: 44 kg 44 kg    Examination:  General exam: Appears calm and comfortable  Respiratory system: Clear to auscultation. Respiratory effort normal. Cardiovascular system: S1 & S2 heard, RRR. No JVD, murmurs, rubs, gallops or clicks. No pedal edema. Gastrointestinal system: Nondistended, tender to palpation in epigastrium, normal bowel sounds   Central nervous system: Alert and oriented. No focal neurological deficits. Extremities: Symmetric 5 x 5 power. Skin: No rashes, lesions or ulcers Psychiatry: Judgement and insight appear normal. Mood & affect appropriate.     Data Reviewed: I have personally reviewed following labs and imaging studies  CBC: Recent Labs  Lab 10/16/19 1652 10/17/19 0250 10/19/19 0714 10/21/19 0808  WBC 16.0* 11.7* 5.0 6.1  NEUTROABS  --   --  3.8 4.0  HGB 10.6* 9.8* 8.9* 8.9*  HCT 32.4* 29.6* 26.9* 27.9*  MCV 102.2* 102.8* 103.9* 106.1*  PLT 219 198 157 542   Basic Metabolic Panel: Recent Labs  Lab 10/16/19 1652 10/17/19 0250 10/18/19 1100 10/19/19 0709 10/19/19 0714 10/20/19 0441 10/21/19 0808  NA 134* 136  --  138  --   --  137  K 4.5 4.3  --  3.8  --   --  3.7  CL 96* 100  --  102  --   --  101  CO2 24 26  --  29  --   --  29  GLUCOSE 127* 90  --  95  --   --  86  BUN 26* 33*  --  13  --   --  11  CREATININE 3.75* 3.61*  --  1.57*  --   --  1.46*  CALCIUM 8.1* 7.9*  --  7.8*  --   --  7.8*  MG  --   --   --   --  1.6*  --  1.4*  PHOS  --   --  2.2*  --   --  4.2  --    GFR: Estimated Creatinine Clearance: 26.3 mL/min (A) (by C-G formula based on SCr of 1.46 mg/dL (H)). Liver Function Tests: Recent Labs  Lab 10/16/19 1652 10/17/19 0250  AST 20 18  ALT 15 14  ALKPHOS 152* 131*  BILITOT 0.9 0.9  PROT 5.2* 5.2*  ALBUMIN 2.4* 2.4*   Recent Labs  Lab 10/16/19 1652  LIPASE 23   No results for input(s): AMMONIA in the last 168 hours. Coagulation Profile: No results for input(s): INR, PROTIME in the last 168 hours. Cardiac Enzymes: No results for input(s): CKTOTAL, CKMB, CKMBINDEX, TROPONINI in the last 168 hours. BNP (last 3 results) No results for input(s): PROBNP in the last 8760 hours. HbA1C: No results for input(s): HGBA1C in the last 72 hours. CBG: Recent Labs  Lab 10/20/19 2147 10/20/19 2354 10/21/19 0254 10/21/19 0404 10/21/19 0807  GLUCAP 112* 163*  90 120* 76   Lipid Profile: No results for input(s): CHOL, HDL, LDLCALC, TRIG, CHOLHDL, LDLDIRECT in the last 72 hours. Thyroid Function Tests: No results for input(s): TSH, T4TOTAL, FREET4, T3FREE, THYROIDAB in the last 72 hours. Anemia Panel: No results for input(s): VITAMINB12, FOLATE, FERRITIN, TIBC, IRON, RETICCTPCT in the last 72 hours. Sepsis Labs: No results for input(s): PROCALCITON, LATICACIDVEN in the last 168 hours.  Recent Results (from the past 240 hour(s))  SARS CORONAVIRUS 2 (TAT 6-24 HRS) Nasopharyngeal Nasopharyngeal Swab     Status: None   Collection Time: 10/16/19  9:18 PM   Specimen: Nasopharyngeal Swab  Result Value Ref Range Status   SARS Coronavirus 2 NEGATIVE NEGATIVE Final    Comment: (NOTE) SARS-CoV-2 target nucleic acids are NOT DETECTED. The SARS-CoV-2 RNA is generally detectable in upper and lower respiratory specimens during the acute phase of infection. Negative results do not preclude SARS-CoV-2 infection, do not rule out co-infections with other pathogens, and should not be used as the sole basis for treatment or other patient management decisions. Negative results must be combined with clinical observations, patient history, and epidemiological information. The expected result is Negative. Fact Sheet for Patients: SugarRoll.be Fact Sheet for Healthcare Providers: https://www.woods-mathews.com/ This test is not yet approved or cleared by the [place] FDA and  has been authorized for detection and/or diagnosis of SARS-CoV-2 by FDA under an Emergency Use Authorization (EUA). This EUA will remain  in effect (meaning this test can be used) for the duration of the COVID-19 declaration under Section 56 4(b)(1) of the Act, 21 U.S.C. section 360bbb-3(b)(1), unless the authorization is terminated or revoked sooner. Performed at [facility], [place] [address], [place], [place] [address]   C difficile  quick scan w PCR reflex     Status: None   Collection Time: 10/18/19  7:48 AM   Specimen: STOOL  Result Value Ref Range Status   C Diff antigen NEGATIVE NEGATIVE Final   C Diff toxin NEGATIVE NEGATIVE Final   C Diff interpretation No C. difficile detected.  Final    Comment: Performed at [facility], [address] [place], [place] [address]         Radiology Studies: No results found.      Scheduled Meds: . allopurinol  100 mg  Oral Daily  . amLODipine  10 mg Oral QHS  . amLODipine  5 mg Oral Daily  . vitamin C  500 mg Oral BID  . carvedilol  25 mg Oral BID  . Chlorhexidine Gluconate Cloth  6 each Topical Q0600  . clopidogrel  75 mg Oral Daily  . dextrose  12.5 g Intravenous Once  . diphenhydrAMINE  50 mg Oral QHS  . epoetin (EPOGEN/PROCRIT) injection  4,000 Units Intravenous Q T,Th,Sa-HD  . feeding supplement  1 Container Oral TID BM  . feeding supplement (PRO-STAT SUGAR FREE 64)  30 mL Oral TID  . gabapentin  300 mg Oral QHS  . heparin injection (subcutaneous)  5,000 Units Subcutaneous Q8H  . hydrALAZINE  25 mg Oral Q8H  . insulin aspart  0-9 Units Subcutaneous Q4H  . lipase/protease/amylase  24,000 Units Oral TID AC  . multivitamin  1 tablet Oral QHS  . nicotine  21 mg Transdermal Daily  . pantoprazole  40 mg Oral Daily  . traZODone  150 mg Oral QHS   Continuous Infusions:   LOS: 5 days    Time spent: 35 minutes    Sidney Ace, MD [facility] Pager [phone]  If 7PM-7AM, please contact night-coverage  10/21/2019, 11:23 AM

## 2019-10-22 LAB — GI PATHOGEN PANEL BY PCR, STOOL

## 2019-10-22 LAB — GLUCOSE, CAPILLARY
Glucose-Capillary: 111 mg/dL — ABNORMAL HIGH (ref 70–99)
Glucose-Capillary: 121 mg/dL — ABNORMAL HIGH (ref 70–99)
Glucose-Capillary: 165 mg/dL — ABNORMAL HIGH (ref 70–99)
Glucose-Capillary: 202 mg/dL — ABNORMAL HIGH (ref 70–99)
Glucose-Capillary: 209 mg/dL — ABNORMAL HIGH (ref 70–99)
Glucose-Capillary: 72 mg/dL (ref 70–99)
Glucose-Capillary: 82 mg/dL (ref 70–99)

## 2019-10-22 MED ORDER — MAGNESIUM SULFATE 2 GM/50ML IV SOLN
2.0000 g | Freq: Once | INTRAVENOUS | Status: AC
  Administered 2019-10-22: 2 g via INTRAVENOUS
  Filled 2019-10-22: qty 50

## 2019-10-22 MED ORDER — HYDROMORPHONE HCL 1 MG/ML IJ SOLN
1.0000 mg | INTRAMUSCULAR | Status: DC | PRN
  Administered 2019-10-22 – 2019-10-25 (×27): 1 mg via INTRAVENOUS
  Filled 2019-10-22 (×28): qty 1

## 2019-10-22 NOTE — Consult Note (Addendum)
[facility] Nurse Consult Note: Reason for Consult: Consult requested for left elbow.  Pt states she had a skin tear occur prior to admission.  Wound type: Full thickness abrasion, skin dark purple and flap of skin approximated over 50% of wound, 50% red moist full thickness wound, scant amt yellow drainage, no odor  Measurement: 1.2X1X.2cm Dressing procedure/placement/frequency: Topical treatment orders provided for bedside nurse to perform daily as follows to promote moist healing: Cut piece of Xeroform gauze and apply to left elbow wound Q day, then cover with foam dressing (change foam dressing Q 3 days or PRN soiling.) Please re-consult if further assistance is needed.  Thank-you,  Julien Girt MSN, [place], [place], [place], [place]

## 2019-10-22 NOTE — Care Management Important Message (Signed)
Important Message  Patient Details  Name: [name] MRN: [id number] Date of Birth: 12/16/1950   Medicare Important Message Given:  Yes     Juliann Pulse A Kerie Badger 10/22/2019, 10:20 AM

## 2019-10-22 NOTE — Progress Notes (Signed)
Patient states when she eats it feels like things get stuck at the esophageal/diaphragm area.

## 2019-10-22 NOTE — Progress Notes (Signed)
PROGRESS NOTE    [name]  HBZ:[id number] DOB: 1951/03/05 DOA: 10/16/2019 PCP: Bernerd Limbo, MD    Brief Narrative:  HPI: [name] is a 69 y.o. female with medical history significant for ESRD on HD TTS, CAD status post CABG, COPD, A. fib, hypertension, COPD and chronic pancreatitis with chronic pain on Creon and oral Dilaudid with recurrent hospitalizations for the same, most recently November 2020, who presents to the emergency room with a 3-day history of nausea and abdominal pain typical for her acute pancreatitis flares.  Abdominal pain is mostly in the epigastrium of severe intensity and nonradiating, aggravated by attempting to eat or drink.  She did vomit once on the day of arrival.  Also states she has been having a loose explosive bowel movement daily.  No history of recent antibiotic use.  She denies fever, chills, cough, chest pain and shortness of breath.'  2/10: Patient seen and evaluated.  Abdominal pain still present.  Endorses mild improvement over interval.  Continues to require IV narcotic.  No vomiting noted over interval.  2/11: Patient seen and evaluated.  Still with abdominal pain.  Tolerating small amounts of clear liquids yesterday.  Continues to require IV narcotic.  Nausea improved over interval.  2/12: Patient seen and evaluated.  Continues with abdominal pain.  Was very upset yesterday.  Patient stated that her narcotics were switched to oral overnight.  No reasoning was provided to patient.  Patient had 7 episodes of watery stool yesterday.  Resolved with Imodium.  Very low suspicion for infectious diarrhea.  Patient is opioid tolerant and continuing to require fairly frequent IV narcotic administration.  2/13: Patient seen and evaluated.  Attempted to advance to soft diet for dinner yesterday.  Patient did not tolerate.  Had acute onset nausea and worsening abdominal pain.  Reverted back to full liquids which patient appears to be tolerating well.  Diarrhea  essentially resolved.  GI PCR panel still pending.  2/14: Patient seen and evaluated. Is tolerating small amounts of soft diet however persistent nausea and abdominal pain. Continues to require IV narcotic fairly frequently. 1-2 loose stools yesterday. GI PCR panel still pending.  2/15: Patient seen and evaluated.  Remains on soft diet but only tolerating intermittently in small amounts.  Still with a lot of abdominal pain.  Patient is not tolerating transition to oral medications.  She is continuing to require IV Dilaudid every 2 hours.  Assessment & Plan:   Principal Problem:   Acute on chronic pancreatitis (HCC) Active Problems:   Essential hypertension   Atrial fibrillation (HCC)   COPD with chronic bronchitis (HCC)   S/P CABG x 3   AAA (abdominal aortic aneurysm) without rupture (HCC)   Chronic diastolic congestive heart failure (HCC)   Type II diabetes mellitus with renal manifestations (HCC)   Recurrent pancreatitis   Chronic pain   ESRD on hemodialysis (HCC)   Acute pancreatitis  Acute on chronic pancreatitis (HCC)   Recurrent pancreatitis   Chronic pain -Lipase was normal -CT abdomen and pelvis showed findings consistent with acute on chronic pancreatitis involving pancreatic head and proximal body with adjacent resultant duodenitis. --Still with significant belly pain and anorexia CT abdomen pelvis showed persistent inflammatory changes consistent with acute pancreatitis Plan: Continue IV Dilaudid.  Patient is requiring 1 mg every 2 hours.  Currently her clinical status does not allow for de-escalation to oral medications. Please do not discontinue or de-escalate unless there is a specific documented reason to do so  Hold IV fluids at this time considering ESRD Advance as tolerated to soft diet, patient has only been tolerating small amounts intermittently.  If persistent abdominal pain can consider de-escalating back to full liquid diet IV PPI Anti-emetics Continuing  home Creon  Diarrhea, improved x 2 weeks Suspect chronic diarrhea in the setting of acute on chronic pancreatitis Stool studies sent, pending results Clinical suspicion for infectious diarrhea is low C. difficile negative GI PCR panel still pending however clinical suspicion for infectious diarrhea low As needed Imodium ordered with good result    ESRD on hemodialysis [facility]) -Patient missed dialysis session on the day of arrival but stable and not in need of urgent dialysis based on symptoms and lab work -Nephrology consult for continuation of dialysis Plan: Nephrology consult appreciated Inpatient HD schedule per nephrology    Essential hypertension Nausea improved, can restart home medications Restart Coreg 25 mg twice daily Restart amlodipine 5 mg daily Hydralazine, 25 mg every 8 hours     Atrial fibrillation ([place]) Resume carvedilol Metoprolol IV for sustained tachycardia Patient not on prophylactic anticoagulation    S/P CABG x 3   AAA (abdominal aortic aneurysm) without rupture ([place])   Chronic diastolic congestive heart failure ([place]) Resume home meds as above Resume Plavix Monitor volume status Telemetry    Type II diabetes mellitus with renal manifestations ([place]) -Sliding scale insulin coverage   DVT prophylaxis: Subcu heparin Code Status: Full Family Communication: Spoke to daughter, 10/22/2019 Disposition Plan: Anticipate home when able to wean from IV pain medication.  Patient is still requiring IV Dilaudid every 2 hours and unable to wean to p.o. medication.     Consultants:   Nephrology  Procedures:   None  Antimicrobials:   None   Subjective: Patient seen and examined Persistent abdominal pain Tolerating small amounts of soft diet Still with persistent belly pain  Objective: Vitals:   10/21/19 1413 10/21/19 2019 10/22/19 0005 10/22/19 0747  BP: (!) 146/65 (!) 155/52 (!) 152/49 (!) 135/57  Pulse: 64 61 68 62  Resp: 15 16 14 17    Temp: 98.2 F (36.8 C) 99.2 F (37.3 C) 98.8 F (37.1 C) 99.2 F (37.3 C)  TempSrc: Oral Oral Oral Oral  SpO2: 99% 96% 97% 94%  Weight:      Height:        Intake/Output Summary (Last 24 hours) at 10/22/2019 1152 Last data filed at 10/22/2019 0900 Gross per 24 hour  Intake 840 ml  Output --  Net 840 ml   Filed Weights   10/16/19 1647 10/17/19 0024  Weight: 44 kg 44 kg    Examination:  General exam: Appears calm and comfortable  Respiratory system: Clear to auscultation. Respiratory effort normal. Cardiovascular system: S1 & S2 heard, RRR. No JVD, murmurs, rubs, gallops or clicks. No pedal edema. Gastrointestinal system: Nondistended, tender to palpation in epigastrium, normal bowel sounds  Central nervous system: Alert and oriented. No focal neurological deficits. Extremities: Symmetric 5 x 5 power. Skin: No rashes, lesions or ulcers Psychiatry: Judgement and insight appear normal. Mood & affect appropriate.     Data Reviewed: I have personally reviewed following labs and imaging studies  CBC: Recent Labs  Lab 10/16/19 1652 10/17/19 0250 10/19/19 0714 10/21/19 0808  WBC 16.0* 11.7* 5.0 6.1  NEUTROABS  --   --  3.8 4.0  HGB 10.6* 9.8* 8.9* 8.9*  HCT 32.4* 29.6* 26.9* 27.9*  MCV 102.2* 102.8* 103.9* 106.1*  PLT 219 198 157 295   Basic Metabolic Panel:  Recent Labs  Lab 10/16/19 1652 10/17/19 0250 10/18/19 1100 10/19/19 0709 10/19/19 0714 10/20/19 0441 10/21/19 0808  NA 134* 136  --  138  --   --  137  K 4.5 4.3  --  3.8  --   --  3.7  CL 96* 100  --  102  --   --  101  CO2 24 26  --  29  --   --  29  GLUCOSE 127* 90  --  95  --   --  86  BUN 26* 33*  --  13  --   --  11  CREATININE 3.75* 3.61*  --  1.57*  --   --  1.46*  CALCIUM 8.1* 7.9*  --  7.8*  --   --  7.8*  MG  --   --   --   --  1.6*  --  1.4*  PHOS  --   --  2.2*  --   --  4.2  --    GFR: Estimated Creatinine Clearance: 26.3 mL/min (A) (by C-G formula based on SCr of 1.46 mg/dL  (H)). Liver Function Tests: Recent Labs  Lab 10/16/19 1652 10/17/19 0250  AST 20 18  ALT 15 14  ALKPHOS 152* 131*  BILITOT 0.9 0.9  PROT 5.2* 5.2*  ALBUMIN 2.4* 2.4*   Recent Labs  Lab 10/16/19 1652  LIPASE 23   No results for input(s): AMMONIA in the last 168 hours. Coagulation Profile: No results for input(s): INR, PROTIME in the last 168 hours. Cardiac Enzymes: No results for input(s): CKTOTAL, CKMB, CKMBINDEX, TROPONINI in the last 168 hours. BNP (last 3 results) No results for input(s): PROBNP in the last 8760 hours. HbA1C: No results for input(s): HGBA1C in the last 72 hours. CBG: Recent Labs  Lab 10/21/19 2109 10/22/19 0006 10/22/19 0348 10/22/19 0749 10/22/19 1146  GLUCAP 126* 209* 111* 72 165*   Lipid Profile: No results for input(s): CHOL, HDL, LDLCALC, TRIG, CHOLHDL, LDLDIRECT in the last 72 hours. Thyroid Function Tests: No results for input(s): TSH, T4TOTAL, FREET4, T3FREE, THYROIDAB in the last 72 hours. Anemia Panel: No results for input(s): VITAMINB12, FOLATE, FERRITIN, TIBC, IRON, RETICCTPCT in the last 72 hours. Sepsis Labs: No results for input(s): PROCALCITON, LATICACIDVEN in the last 168 hours.  Recent Results (from the past 240 hour(s))  SARS CORONAVIRUS 2 (TAT 6-24 HRS) Nasopharyngeal Nasopharyngeal Swab     Status: None   Collection Time: 10/16/19  9:18 PM   Specimen: Nasopharyngeal Swab  Result Value Ref Range Status   SARS Coronavirus 2 NEGATIVE NEGATIVE Final    Comment: (NOTE) SARS-CoV-2 target nucleic acids are NOT DETECTED. The SARS-CoV-2 RNA is generally detectable in upper and lower respiratory specimens during the acute phase of infection. Negative results do not preclude SARS-CoV-2 infection, do not rule out co-infections with other pathogens, and should not be used as the sole basis for treatment or other patient management decisions. Negative results must be combined with clinical observations, patient history, and  epidemiological information. The expected result is Negative. Fact Sheet for Patients: SugarRoll.be Fact Sheet for Healthcare Providers: https://www.woods-mathews.com/ This test is not yet approved or cleared by the [place] FDA and  has been authorized for detection and/or diagnosis of SARS-CoV-2 by FDA under an Emergency Use Authorization (EUA). This EUA will remain  in effect (meaning this test can be used) for the duration of the COVID-19 declaration under Section 56 4(b)(1) of the Act, 21 U.S.C. section 360bbb-3(b)(1),  unless the authorization is terminated or revoked sooner. Performed at [facility], [place] [address] [place], [place] [address]   GI pathogen panel by PCR, stool     Status: None   Collection Time: 10/18/19  7:48 AM   Specimen: Stool  Result Value Ref Range Status   Plesiomonas shigelloides NOT DETECTED NOT DETECTED Final   Yersinia enterocolitica NOT DETECTED NOT DETECTED Final   Vibrio NOT DETECTED NOT DETECTED Final   Enteropathogenic E coli NOT DETECTED NOT DETECTED Final   E coli (ETEC) LT/ST NOT DETECTED NOT DETECTED Final   E coli 7001 by PCR Not applicable NOT DETECTED Final   Cryptosporidium by PCR NOT DETECTED NOT DETECTED Final   Entamoeba histolytica NOT DETECTED NOT DETECTED Final   Adenovirus F 40/41 NOT DETECTED NOT DETECTED Final   Norovirus GI/GII NOT DETECTED NOT DETECTED Final   Sapovirus NOT DETECTED NOT DETECTED Final    Comment: (NOTE) Performed At: [facility] [place], [place] [id number] Rush Farmer MD FF:[id number]    Vibrio cholerae NOT DETECTED NOT DETECTED Final   Campylobacter by PCR NOT DETECTED NOT DETECTED Final   Salmonella by PCR NOT DETECTED NOT DETECTED Final   E coli (STEC) NOT DETECTED NOT DETECTED Final   Enteroaggregative E coli NOT DETECTED NOT DETECTED Final   Shigella by PCR NOT DETECTED NOT DETECTED Final   Cyclospora cayetanensis NOT  DETECTED NOT DETECTED Final   Astrovirus NOT DETECTED NOT DETECTED Final   G lamblia by PCR NOT DETECTED NOT DETECTED Final   Rotavirus A by PCR NOT DETECTED NOT DETECTED Final  C difficile quick scan w PCR reflex     Status: None   Collection Time: 10/18/19  7:48 AM   Specimen: STOOL  Result Value Ref Range Status   C Diff antigen NEGATIVE NEGATIVE Final   C Diff toxin NEGATIVE NEGATIVE Final   C Diff interpretation No C. difficile detected.  Final    Comment: Performed at [facility], [address], [place], [place] [address]         Radiology Studies: CT ABDOMEN PELVIS WO CONTRAST  Result Date: 10/21/2019 CLINICAL DATA:  Abdominal pain with nausea, vomiting and diarrhea for 4 days. History of chronic pancreatitis. EXAM: CT ABDOMEN AND PELVIS WITHOUT CONTRAST TECHNIQUE: Multidetector CT imaging of the abdomen and pelvis was performed following the standard protocol without IV contrast. COMPARISON:  CT abdomen dated 10/16/2019. FINDINGS: Lower chest: New small RIGHT pleural effusion. Hepatobiliary: No focal mass or lesion is seen within the liver. Intrahepatic bile duct dilatation and/or periportal edema. Status post cholecystectomy. Stable appearance of the mildly distended common bile duct. No CBD stone appreciated. Pancreas: Coarse calcifications again noted within the prominent pancreatic head. Configuration the pancreatic head appears stable in the short-term interval. There is stable dilatation of the pancreatic duct within the pancreatic head. Walls of the adjacent duodenum again appear thickened, presumably reactive in nature. Spleen: Normal in size without focal abnormality. Adrenals/Urinary Tract: Adrenal glands appear normal. Bilateral renal stones. No hydronephrosis. No ureteral or bladder calculi are identified. Bladder is unremarkable, partially decompressed Stomach/Bowel: Surgical anastomosis at the rectosigmoid colon level, without obstruction, inflammation or other  surgical complicating feature. No dilated large or small bowel loops. Presumed appendectomy. Stomach is unremarkable, partially decompressed. Vascular/Lymphatic: Aorta bi-iliac stents in place. 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 No periaortic hemorrhage or edema appreciated. Bilateral renal artery stents are also in place. Heavy atherosclerotic changes  are seen along the remainder of the abdominal aorta and aortic branch vessels. No enlarged lymph nodes seen in the abdomen or pelvis. Reproductive: Presumed hysterectomy. No adnexal mass or free fluid. Other: No free fluid or abscess collection is seen. No free intraperitoneal air. Musculoskeletal: No acute or suspicious osseous finding. Degenerative spondylosis of the slightly scoliotic thoracolumbar spine, mild to moderate in degree. IMPRESSION: 1. New small RIGHT pleural effusion. 2. Stable appearance of the enlarged pancreatic head, with associated coarse calcifications and pancreatic duct dilatation, and with small amount of fluid stranding again seen within the peripancreatic soft tissues corresponding to recent episode of acute pancreatitis (acute on chronic pancreatitis). 3. Walls of the adjacent duodenum again appear thickened, likely reactive in nature. 4. Aorta bi-iliac stents in place. 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 No periaortic hemorrhage or edema appreciated. 5. Bilateral nephrolithiasis. No hydronephrosis. No ureteral or bladder calculi. 6. No bowel obstruction. No secondary signs of bowel wall ischemia. No abscess collection or free intraperitoneal air. Aortic Atherosclerosis (ICD10-I70.0). Electronically Signed   By: Franki Cabot M.D.   On: 10/21/2019 13:46        Scheduled Meds:  allopurinol  100 mg Oral Daily   amLODipine  10 mg Oral QHS   vitamin C  500 mg Oral BID   carvedilol  25 mg Oral BID   Chlorhexidine Gluconate Cloth  6 each Topical Q0600   clopidogrel  75 mg  Oral Daily   dextrose  12.5 g Intravenous Once   diphenhydrAMINE  50 mg Oral QHS   epoetin (EPOGEN/PROCRIT) injection  4,000 Units Intravenous Q T,Th,Sa-HD   feeding supplement  1 Container Oral TID BM   feeding supplement (PRO-STAT SUGAR FREE 64)  30 mL Oral TID   gabapentin  300 mg Oral QHS   heparin injection (subcutaneous)  5,000 Units Subcutaneous Q8H   hydrALAZINE  25 mg Oral Q8H   insulin aspart  0-9 Units Subcutaneous Q4H   lipase/protease/amylase  24,000 Units Oral TID AC   multivitamin  1 tablet Oral QHS   nicotine  21 mg Transdermal Daily   pantoprazole  40 mg Oral Daily   traZODone  150 mg Oral QHS   Continuous Infusions:   LOS: 6 days    Time spent: 35 minutes    Sidney Ace, MD [facility] Pager [phone]  If 7PM-7AM, please contact night-coverage  10/22/2019, 11:52 AM

## 2019-10-22 NOTE — Progress Notes (Signed)
[address] [place], [place] [address] Phone [phone]. Fax [phone]  Date: 10/22/2019                  Patient Name:  [name]  MRN: [id number]  DOB: [birth year]  Age / Sex: 69 y.o., female          Subjective: Patient continues to have midepigastric abdominal pain. Due for dialysis again tomorrow.  Current medications: Current Facility-Administered Medications  Medication Dose Route Frequency Provider Last Rate Last Admin  . albuterol (PROVENTIL) (2.5 MG/3ML) 0.083% nebulizer solution 3 mL  3 mL Inhalation Q4H PRN Sreenath, Sudheer B, MD      . allopurinol (ZYLOPRIM) tablet 100 mg  100 mg Oral Daily Priscella Mann, Sudheer B, MD   100 mg at 10/22/19 0944  . amLODipine (NORVASC) tablet 10 mg  10 mg Oral QHS Ralene Muskrat B, MD   10 mg at 10/21/19 2023  . ascorbic acid (VITAMIN C) tablet 500 mg  500 mg Oral BID Ralene Muskrat B, MD   500 mg at 10/22/19 4967  . carvedilol (COREG) tablet 25 mg  25 mg Oral BID Ralene Muskrat B, MD   25 mg at 10/22/19 0944  . Chlorhexidine Gluconate Cloth 2 % PADS 6 each  6 each Topical Q0600 Murlean Iba, MD   6 each at 10/20/19 0603  . clopidogrel (PLAVIX) tablet 75 mg  75 mg Oral Daily Ralene Muskrat B, MD   75 mg at 10/22/19 0944  . dextrose 50 % solution 12.5 g  12.5 g Intravenous Once Sharion Settler, NP      . diphenhydrAMINE (BENADRYL) capsule 50 mg  50 mg Oral QHS Ralene Muskrat B, MD   50 mg at 10/21/19 2247  . epoetin alfa (EPOGEN) injection 4,000 Units  4,000 Units Intravenous Q T,Th,Sa-HD Murlean Iba, MD   4,000 Units at 10/20/19 1001  . feeding supplement (BOOST / RESOURCE BREEZE) liquid 1 Container  1 Container Oral TID BM Ralene Muskrat B, MD   1 Container at 10/22/19 1400  . feeding supplement (PRO-STAT SUGAR FREE 64) liquid 30 mL  30 mL Oral TID Ralene Muskrat B, MD   30 mL at 10/22/19 1704  . gabapentin (NEURONTIN) capsule 300 mg  300 mg Oral QHS Sreenath, Sudheer B, MD   300 mg at 10/21/19  2023  . heparin injection 5,000 Units  5,000 Units Subcutaneous Q8H Athena Masse, MD   5,000 Units at 10/22/19 1300  . hydrALAZINE (APRESOLINE) tablet 25 mg  25 mg Oral Q8H Sreenath, Sudheer B, MD   25 mg at 10/22/19 1300  . HYDROmorphone (DILAUDID) injection 1 mg  1 mg Intravenous Q2H PRN Ralene Muskrat B, MD   1 mg at 10/22/19 1654  . HYDROmorphone (DILAUDID) tablet 2 mg  2 mg Oral Q4H PRN Sreenath, Sudheer B, MD      . insulin aspart (novoLOG) injection 0-9 Units  0-9 Units Subcutaneous Q4H Athena Masse, MD   3 Units at 10/22/19 1654  . lipase/protease/amylase (CREON) capsule 24,000 Units  24,000 Units Oral TID AC Ralene Muskrat B, MD   24,000 Units at 10/22/19 1653  . loperamide (IMODIUM) capsule 2 mg  2 mg Oral PRN Ralene Muskrat B, MD   2 mg at 10/19/19 1805  . multivitamin (RENA-VIT) tablet 1 tablet  1 tablet Oral QHS Ralene Muskrat B, MD   1 tablet at 10/21/19 2023  . nicotine (NICODERM CQ - dosed in mg/24 hours) patch 21 mg  21  mg Transdermal Daily Ralene Muskrat B, MD   21 mg at 10/22/19 0952  . ondansetron (ZOFRAN) tablet 4 mg  4 mg Oral Q6H PRN Athena Masse, MD       Or  . ondansetron [facility]) injection 4 mg  4 mg Intravenous Q6H PRN Athena Masse, MD   4 mg at 10/18/19 1922  . pantoprazole (PROTONIX) EC tablet 40 mg  40 mg Oral Daily Ralene Muskrat B, MD   40 mg at 10/22/19 0944  . promethazine (PHENERGAN) injection 6.25 mg  6.25 mg Intravenous Q6H PRN Athena Masse, MD   6.25 mg at 10/20/19 2340  . traZODone (DESYREL) tablet 150 mg  150 mg Oral QHS Sreenath, Sudheer B, MD   150 mg at 10/21/19 2247     Vital Signs: Blood pressure 139/66, pulse 62, temperature 97.8 F (36.6 C), temperature source Oral, resp. rate 17, height 5\' 5"  (1.651 m), weight 44 kg, SpO2 99 %.   Intake/Output Summary (Last 24 hours) at 10/22/2019 1717 Last data filed at 10/22/2019 0900 Gross per 24 hour  Intake 480 ml  Output --  Net 480 ml    Weight trends: [employer]    10/16/19 1647 10/17/19 0024  Weight: 44 kg 44 kg    Physical Exam: General:  Thin, frail, elderly female, laying in the bed  HEENT  anicteric, moist oral mucous membranes  Neck:  Supple, no mass  Lungs:  normal breathing effort, clear to auscultation  Heart::  No rub or gallop, irregular rhythm  Abdomen:  Soft, mild midepigastric tenderness  Extremities:  No peripheral edema  Neurologic:  Alert, oriented, able to answer questions  Skin:  Warm, dry  Access:  Left arm AV fistula    Lab results: Basic Metabolic Panel: Recent Labs  Lab 10/17/19 0250 10/18/19 1100 10/19/19 0709 10/19/19 0714 10/20/19 0441 10/21/19 0808  NA 136  --  138  --   --  137  K 4.3  --  3.8  --   --  3.7  CL 100  --  102  --   --  101  CO2 26  --  29  --   --  29  GLUCOSE 90  --  95  --   --  86  BUN 33*  --  13  --   --  11  CREATININE 3.61*  --  1.57*  --   --  1.46*  CALCIUM 7.9*  --  7.8*  --   --  7.8*  MG  --   --   --  1.6*  --  1.4*  PHOS  --  2.2*  --   --  4.2  --     Liver Function Tests: Recent Labs  Lab 10/17/19 0250  AST 18  ALT 14  ALKPHOS 131*  BILITOT 0.9  PROT 5.2*  ALBUMIN 2.4*   Recent Labs  Lab 10/16/19 1652  LIPASE 23   No results for input(s): AMMONIA in the last 168 hours.  CBC: Recent Labs  Lab 10/19/19 0714 10/21/19 0808  WBC 5.0 6.1  NEUTROABS 3.8 4.0  HGB 8.9* 8.9*  HCT 26.9* 27.9*  MCV 103.9* 106.1*  PLT 157 167    Cardiac Enzymes: No results for input(s): CKTOTAL, TROPONINI in the last 168 hours.  BNP: Invalid input(s): POCBNP  CBG: Recent Labs  Lab 10/22/19 0006 10/22/19 0348 10/22/19 0749 10/22/19 1146 10/22/19 1641  GLUCAP 209* 111* 72 165* 202*  Microbiology: Recent Results (from the past 720 hour(s))  SARS CORONAVIRUS 2 (TAT 6-24 HRS) Nasopharyngeal Nasopharyngeal Swab     Status: None   Collection Time: 10/16/19  9:18 PM   Specimen: Nasopharyngeal Swab  Result Value Ref Range Status   SARS Coronavirus 2 NEGATIVE  NEGATIVE Final    Comment: (NOTE) SARS-CoV-2 target nucleic acids are NOT DETECTED. The SARS-CoV-2 RNA is generally detectable in upper and lower respiratory specimens during the acute phase of infection. Negative results do not preclude SARS-CoV-2 infection, do not rule out co-infections with other pathogens, and should not be used as the sole basis for treatment or other patient management decisions. Negative results must be combined with clinical observations, patient history, and epidemiological information. The expected result is Negative. Fact Sheet for Patients: SugarRoll.be Fact Sheet for Healthcare Providers: https://www.woods-mathews.com/ This test is not yet approved or cleared by the [place] FDA and  has been authorized for detection and/or diagnosis of SARS-CoV-2 by FDA under an Emergency Use Authorization (EUA). This EUA will remain  in effect (meaning this test can be used) for the duration of the COVID-19 declaration under Section 56 4(b)(1) of the Act, 21 U.S.C. section 360bbb-3(b)(1), unless the authorization is terminated or revoked sooner. Performed at [facility], [place] [address] [place], [place] [address]   GI pathogen panel by PCR, stool     Status: None   Collection Time: 10/18/19  7:48 AM   Specimen: Stool  Result Value Ref Range Status   Plesiomonas shigelloides NOT DETECTED NOT DETECTED Final   Yersinia enterocolitica NOT DETECTED NOT DETECTED Final   Vibrio NOT DETECTED NOT DETECTED Final   Enteropathogenic E coli NOT DETECTED NOT DETECTED Final   E coli (ETEC) LT/ST NOT DETECTED NOT DETECTED Final   E coli 9357 by PCR Not applicable NOT DETECTED Final   Cryptosporidium by PCR NOT DETECTED NOT DETECTED Final   Entamoeba histolytica NOT DETECTED NOT DETECTED Final   Adenovirus F 40/41 NOT DETECTED NOT DETECTED Final   Norovirus GI/GII NOT DETECTED NOT DETECTED Final   Sapovirus NOT DETECTED NOT  DETECTED Final    Comment: (NOTE) Performed At: [facility] [place], [place] [id number] Rush Farmer MD ES:[id number]    Vibrio cholerae NOT DETECTED NOT DETECTED Final   Campylobacter by PCR NOT DETECTED NOT DETECTED Final   Salmonella by PCR NOT DETECTED NOT DETECTED Final   E coli (STEC) NOT DETECTED NOT DETECTED Final   Enteroaggregative E coli NOT DETECTED NOT DETECTED Final   Shigella by PCR NOT DETECTED NOT DETECTED Final   Cyclospora cayetanensis NOT DETECTED NOT DETECTED Final   Astrovirus NOT DETECTED NOT DETECTED Final   G lamblia by PCR NOT DETECTED NOT DETECTED Final   Rotavirus A by PCR NOT DETECTED NOT DETECTED Final  C difficile quick scan w PCR reflex     Status: None   Collection Time: 10/18/19  7:48 AM   Specimen: STOOL  Result Value Ref Range Status   C Diff antigen NEGATIVE NEGATIVE Final   C Diff toxin NEGATIVE NEGATIVE Final   C Diff interpretation No C. difficile detected.  Final    Comment: Performed at [facility], [address] [place], [place] [address]     Coagulation Studies: No results for input(s): LABPROT, INR in the last 72 hours.  Urinalysis: No results for input(s): COLORURINE, LABSPEC, PHURINE, GLUCOSEU, HGBUR, BILIRUBINUR, KETONESUR, PROTEINUR, UROBILINOGEN, NITRITE, LEUKOCYTESUR in the last 72 hours.  Invalid input(s):  APPERANCEUR    Imaging: CT ABDOMEN PELVIS WO CONTRAST  Result Date: 10/21/2019 CLINICAL DATA:  Abdominal pain with nausea, vomiting and diarrhea for 4 days. History of chronic pancreatitis. EXAM: CT ABDOMEN AND PELVIS WITHOUT CONTRAST TECHNIQUE: Multidetector CT imaging of the abdomen and pelvis was performed following the standard protocol without IV contrast. COMPARISON:  CT abdomen dated 10/16/2019. FINDINGS: Lower chest: New small RIGHT pleural effusion. Hepatobiliary: No focal mass or lesion is seen within the liver. Intrahepatic bile duct dilatation and/or periportal edema. Status  post cholecystectomy. Stable appearance of the mildly distended common bile duct. No CBD stone appreciated. Pancreas: Coarse calcifications again noted within the prominent pancreatic head. Configuration the pancreatic head appears stable in the short-term interval. There is stable dilatation of the pancreatic duct within the pancreatic head. Walls of the adjacent duodenum again appear thickened, presumably reactive in nature. Spleen: Normal in size without focal abnormality. Adrenals/Urinary Tract: Adrenal glands appear normal. Bilateral renal stones. No hydronephrosis. No ureteral or bladder calculi are identified. Bladder is unremarkable, partially decompressed Stomach/Bowel: Surgical anastomosis at the rectosigmoid colon level, without obstruction, inflammation or other surgical complicating feature. No dilated large or small bowel loops. Presumed appendectomy. Stomach is unremarkable, partially decompressed. Vascular/Lymphatic: Aorta bi-iliac stents in place. 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 No periaortic hemorrhage or edema appreciated. Bilateral renal artery stents are also in place. Heavy atherosclerotic changes are seen along the remainder of the abdominal aorta and aortic branch vessels. No enlarged lymph nodes seen in the abdomen or pelvis. Reproductive: Presumed hysterectomy. No adnexal mass or free fluid. Other: No free fluid or abscess collection is seen. No free intraperitoneal air. Musculoskeletal: No acute or suspicious osseous finding. Degenerative spondylosis of the slightly scoliotic thoracolumbar spine, mild to moderate in degree. IMPRESSION: 1. New small RIGHT pleural effusion. 2. Stable appearance of the enlarged pancreatic head, with associated coarse calcifications and pancreatic duct dilatation, and with small amount of fluid stranding again seen within the peripancreatic soft tissues corresponding to recent episode of acute pancreatitis (acute on chronic  pancreatitis). 3. Walls of the adjacent duodenum again appear thickened, likely reactive in nature. 4. Aorta bi-iliac stents in place. 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 No periaortic hemorrhage or edema appreciated. 5. Bilateral nephrolithiasis. No hydronephrosis. No ureteral or bladder calculi. 6. No bowel obstruction. No secondary signs of bowel wall ischemia. No abscess collection or free intraperitoneal air. Aortic Atherosclerosis (ICD10-I70.0). Electronically Signed   By: Franki Cabot M.D.   On: 10/21/2019 13:46     Assessment & Plan: Pt is a 69 y.o. Caucasian   female with  medical problems of ESRD, on hemodialysis, CAD/CABG 03/2011, COPD, atrial fibrillation, hypertension, COPD, chronic pancreatitis and chronic abdominal pain requiring recurrent hospitalizations , h/o AAA, cholecystectomy, severe peripheral arterial disease including bilateral renal artery stent, subclavian artery stenosis, left, left CEA was admitted on 10/16/2019 with abdominal pain, diarrhea Acute pancreatitis [K85.90] Acute on chronic pancreatitis ([place]) [K85.90, K86.1]   [facility]/TTS/left arm AV fistula/[facility] [place]  #End-stage renal disease Patient seen and evaluated at bedside.  No urgent indication for dialysis today.  We will prepare dialysis orders for tomorrow.   #Acute pancreatitis Continue pain control, dietary modification, and pancreatic enzyme replacement.  #Anemia of chronic kidney disease Lab Results  Component Value Date   HGB 8.9 (L) 10/21/2019  Continue Epogen with dialysis treatments.    LOS: 6 Harris Penton 2/15/20215:17 PM    Note: This note  was prepared with Dragon dictation. Any transcription errors are unintentional

## 2019-10-23 LAB — CBC
HCT: 24.4 % — ABNORMAL LOW (ref 36.0–46.0)
Hemoglobin: 8.1 g/dL — ABNORMAL LOW (ref 12.0–15.0)
MCH: 34.2 pg — ABNORMAL HIGH (ref 26.0–34.0)
MCHC: 33.2 g/dL (ref 30.0–36.0)
MCV: 103 fL — ABNORMAL HIGH (ref 80.0–100.0)
Platelets: 193 10*3/uL (ref 150–400)
RBC: 2.37 MIL/uL — ABNORMAL LOW (ref 3.87–5.11)
RDW: 15.3 % (ref 11.5–15.5)
WBC: 8.8 10*3/uL (ref 4.0–10.5)
nRBC: 0 % (ref 0.0–0.2)

## 2019-10-23 LAB — GLUCOSE, CAPILLARY
Glucose-Capillary: 101 mg/dL — ABNORMAL HIGH (ref 70–99)
Glucose-Capillary: 110 mg/dL — ABNORMAL HIGH (ref 70–99)
Glucose-Capillary: 132 mg/dL — ABNORMAL HIGH (ref 70–99)
Glucose-Capillary: 141 mg/dL — ABNORMAL HIGH (ref 70–99)
Glucose-Capillary: 86 mg/dL (ref 70–99)
Glucose-Capillary: 94 mg/dL (ref 70–99)
Glucose-Capillary: 98 mg/dL (ref 70–99)

## 2019-10-23 MED ORDER — BOOST / RESOURCE BREEZE PO LIQD CUSTOM
1.0000 | Freq: Every day | ORAL | Status: DC
  Administered 2019-10-23 – 2019-10-25 (×7): 1 via ORAL

## 2019-10-23 NOTE — Progress Notes (Signed)
PROGRESS NOTE    [name]  ZOX:[id number] DOB: [birth year] DOA: 10/16/2019 PCP: Bernerd Limbo, MD    Brief Narrative:  HPI: [name] is a 69 y.o. female with medical history significant for ESRD on HD TTS, CAD status post CABG, COPD, A. fib, hypertension, COPD and chronic pancreatitis with chronic pain on Creon and oral Dilaudid with recurrent hospitalizations for the same, most recently November 2020, who presents to the emergency room with a 3-day history of nausea and abdominal pain typical for her acute pancreatitis flares.  Abdominal pain is mostly in the epigastrium of severe intensity and nonradiating, aggravated by attempting to eat or drink.  She did vomit once on the day of arrival.  Also states she has been having a loose explosive bowel movement daily.  No history of recent antibiotic use.  She denies fever, chills, cough, chest pain and shortness of breath.'  2/10: Patient seen and evaluated.  Abdominal pain still present.  Endorses mild improvement over interval.  Continues to require IV narcotic.  No vomiting noted over interval.  2/11: Patient seen and evaluated.  Still with abdominal pain.  Tolerating small amounts of clear liquids yesterday.  Continues to require IV narcotic.  Nausea improved over interval.  2/12: Patient seen and evaluated.  Continues with abdominal pain.  Was very upset yesterday.  Patient stated that her narcotics were switched to oral overnight.  No reasoning was provided to patient.  Patient had 7 episodes of watery stool yesterday.  Resolved with Imodium.  Very low suspicion for infectious diarrhea.  Patient is opioid tolerant and continuing to require fairly frequent IV narcotic administration.  2/13: Patient seen and evaluated.  Attempted to advance to soft diet for dinner yesterday.  Patient did not tolerate.  Had acute onset nausea and worsening abdominal pain.  Reverted back to full liquids which patient appears to be tolerating well.  Diarrhea  essentially resolved.  GI PCR panel still pending.  2/14: Patient seen and evaluated. Is tolerating small amounts of soft diet however persistent nausea and abdominal pain. Continues to require IV narcotic fairly frequently. 1-2 loose stools yesterday. GI PCR panel still pending.  2/15: Patient seen and evaluated.  Remains on soft diet but only tolerating intermittently in small amounts.  Still with a lot of abdominal pain.  Patient is not tolerating transition to oral medications.  She is continuing to require IV Dilaudid every 2 hours.  2/16: Patient seen and evaluated.  Attempted soft diet this morning however had acute onset abdominal pain and nausea.  No vomiting it.  Patient continues to not tolerate transition to oral medications.  Continues to require IV Dilaudid every 2 hours.  Assessment & Plan:   Principal Problem:   Acute on chronic pancreatitis (HCC) Active Problems:   Essential hypertension   Atrial fibrillation (HCC)   COPD with chronic bronchitis (HCC)   S/P CABG x 3   AAA (abdominal aortic aneurysm) without rupture (HCC)   Chronic diastolic congestive heart failure (HCC)   Type II diabetes mellitus with renal manifestations (HCC)   Recurrent pancreatitis   Chronic pain   ESRD on hemodialysis (HCC)   Acute pancreatitis  Acute on chronic pancreatitis (HCC)   Recurrent pancreatitis   Chronic pain -Lipase was normal -CT abdomen and pelvis showed findings consistent with acute on chronic pancreatitis involving pancreatic head and proximal body with adjacent resultant duodenitis. --Still with significant belly pain and anorexia CT abdomen pelvis showed persistent inflammatory changes consistent with acute  pancreatitis Plan: Continue IV Dilaudid.  Patient is requiring 1 mg every 2 hours.  Currently her clinical status does not allow for de-escalation to oral medications. Hold IV fluids at this time considering ESRD Patient is opioid tolerant and is continuing to require  IV pain medication Will scale back to full liquid diet today -If unable to advance patient's diet and she is not meeting her nutritional goals will need to consider interventional radiology consult for placement of a image guided postpyloric feeding tube.  Diarrhea, improved x 2 weeks Suspect chronic diarrhea in the setting of acute on chronic pancreatitis Stool studies sent, pending results Clinical suspicion for infectious diarrhea is low C. difficile negative GI PCR panel negative As needed Imodium    ESRD on hemodialysis [facility]) -Patient missed dialysis session on the day of arrival but stable and not in need of urgent dialysis based on symptoms and lab work -Nephrology consult for continuation of dialysis Plan: Nephrology consult appreciated Inpatient HD schedule per nephrology    Essential hypertension Nausea improved, can restart home medications Restart Coreg 25 mg twice daily Restart amlodipine 5 mg daily Hydralazine, 25 mg every 8 hours     Atrial fibrillation ([place]) Resume carvedilol Metoprolol IV for sustained tachycardia Patient not on prophylactic anticoagulation    S/P CABG x 3   AAA (abdominal aortic aneurysm) without rupture ([place])   Chronic diastolic congestive heart failure ([place]) Resume home meds as above Resume Plavix Monitor volume status Telemetry    Type II diabetes mellitus with renal manifestations (HCC) -Sliding scale insulin coverage   DVT prophylaxis: Subcu heparin Code Status: Full Family Communication: Spoke to daughter, 10/22/2019 Disposition Plan: Anticipate home when able to wean from IV pain medication.  Patient is still requiring IV Dilaudid every 2 hours and unable to wean to p.o. medication.     Consultants:   Nephrology  Procedures:   None  Antimicrobials:   None   Subjective: Patient seen and examined Persistent abdominal pain Tolerating small amounts of soft diet Still with persistent belly  pain  Objective: Vitals:   10/23/19 1050 10/23/19 1055 10/23/19 1100 10/23/19 1115  BP: 131/80 132/68 132/62 (!) 115/55  Pulse: (!) 59 (!) 59 (!) 58 (!) 57  Resp: 15 12 12 18   Temp:      TempSrc:      SpO2:   100% 100%  Weight:      Height:        Intake/Output Summary (Last 24 hours) at 10/23/2019 1123 Last data filed at 10/23/2019 0930 Gross per 24 hour  Intake 597 ml  Output 450 ml  Net 147 ml   Filed Weights   10/16/19 1647 10/17/19 0024 10/23/19 1040  Weight: 44 kg 44 kg 44.7 kg    Examination:  General exam: Appears calm and comfortable  Respiratory system: Clear to auscultation. Respiratory effort normal. Cardiovascular system: S1 & S2 heard, RRR. No JVD, murmurs, rubs, gallops or clicks. No pedal edema. Gastrointestinal system: Nondistended, tender to palpation in epigastrium, normal bowel sounds  Central nervous system: Alert and oriented. No focal neurological deficits. Extremities: Symmetric 5 x 5 power. Skin: No rashes, lesions or ulcers Psychiatry: Judgement and insight appear normal. Mood & affect appropriate.     Data Reviewed: I have personally reviewed following labs and imaging studies  CBC: Recent Labs  Lab 10/16/19 1652 10/17/19 0250 10/19/19 0714 10/21/19 0808 10/23/19 0425  WBC 16.0* 11.7* 5.0 6.1 8.8  NEUTROABS  --   --  3.8 4.0  --  HGB 10.6* 9.8* 8.9* 8.9* 8.1*  HCT 32.4* 29.6* 26.9* 27.9* 24.4*  MCV 102.2* 102.8* 103.9* 106.1* 103.0*  PLT 219 198 157 167 081   Basic Metabolic Panel: Recent Labs  Lab 10/16/19 1652 10/17/19 0250 10/18/19 1100 10/19/19 0709 10/19/19 0714 10/20/19 0441 10/21/19 0808  NA 134* 136  --  138  --   --  137  K 4.5 4.3  --  3.8  --   --  3.7  CL 96* 100  --  102  --   --  101  CO2 24 26  --  29  --   --  29  GLUCOSE 127* 90  --  95  --   --  86  BUN 26* 33*  --  13  --   --  11  CREATININE 3.75* 3.61*  --  1.57*  --   --  1.46*  CALCIUM 8.1* 7.9*  --  7.8*  --   --  7.8*  MG  --   --   --   --   1.6*  --  1.4*  PHOS  --   --  2.2*  --   --  4.2  --    GFR: Estimated Creatinine Clearance: 26.7 mL/min (A) (by C-G formula based on SCr of 1.46 mg/dL (H)). Liver Function Tests: Recent Labs  Lab 10/16/19 1652 10/17/19 0250  AST 20 18  ALT 15 14  ALKPHOS 152* 131*  BILITOT 0.9 0.9  PROT 5.2* 5.2*  ALBUMIN 2.4* 2.4*   Recent Labs  Lab 10/16/19 1652  LIPASE 23   No results for input(s): AMMONIA in the last 168 hours. Coagulation Profile: No results for input(s): INR, PROTIME in the last 168 hours. Cardiac Enzymes: No results for input(s): CKTOTAL, CKMB, CKMBINDEX, TROPONINI in the last 168 hours. BNP (last 3 results) No results for input(s): PROBNP in the last 8760 hours. HbA1C: No results for input(s): HGBA1C in the last 72 hours. CBG: Recent Labs  Lab 10/23/19 0047 10/23/19 0055 10/23/19 0355 10/23/19 0635 10/23/19 0818  GLUCAP 110* 101* 94 86 98   Lipid Profile: No results for input(s): CHOL, HDL, LDLCALC, TRIG, CHOLHDL, LDLDIRECT in the last 72 hours. Thyroid Function Tests: No results for input(s): TSH, T4TOTAL, FREET4, T3FREE, THYROIDAB in the last 72 hours. Anemia Panel: No results for input(s): VITAMINB12, FOLATE, FERRITIN, TIBC, IRON, RETICCTPCT in the last 72 hours. Sepsis Labs: No results for input(s): PROCALCITON, LATICACIDVEN in the last 168 hours.  Recent Results (from the past 240 hour(s))  SARS CORONAVIRUS 2 (TAT 6-24 HRS) Nasopharyngeal Nasopharyngeal Swab     Status: None   Collection Time: 10/16/19  9:18 PM   Specimen: Nasopharyngeal Swab  Result Value Ref Range Status   SARS Coronavirus 2 NEGATIVE NEGATIVE Final    Comment: (NOTE) SARS-CoV-2 target nucleic acids are NOT DETECTED. The SARS-CoV-2 RNA is generally detectable in upper and lower respiratory specimens during the acute phase of infection. Negative results do not preclude SARS-CoV-2 infection, do not rule out co-infections with other pathogens, and should not be used as  the sole basis for treatment or other patient management decisions. Negative results must be combined with clinical observations, patient history, and epidemiological information. The expected result is Negative. Fact Sheet for Patients: SugarRoll.be Fact Sheet for Healthcare Providers: https://www.woods-mathews.com/ This test is not yet approved or cleared by the [place] FDA and  has been authorized for detection and/or diagnosis of SARS-CoV-2 by FDA under an Emergency Use Authorization (  EUA). This EUA will remain  in effect (meaning this test can be used) for the duration of the COVID-19 declaration under Section 56 4(b)(1) of the Act, 21 U.S.C. section 360bbb-3(b)(1), unless the authorization is terminated or revoked sooner. Performed at [facility], [place] [address], [place], [place] [address]   GI pathogen panel by PCR, stool     Status: None   Collection Time: 10/18/19  7:48 AM   Specimen: Stool  Result Value Ref Range Status   Plesiomonas shigelloides NOT DETECTED NOT DETECTED Final   Yersinia enterocolitica NOT DETECTED NOT DETECTED Final   Vibrio NOT DETECTED NOT DETECTED Final   Enteropathogenic E coli NOT DETECTED NOT DETECTED Final   E coli (ETEC) LT/ST NOT DETECTED NOT DETECTED Final   E coli 5093 by PCR Not applicable NOT DETECTED Final   Cryptosporidium by PCR NOT DETECTED NOT DETECTED Final   Entamoeba histolytica NOT DETECTED NOT DETECTED Final   Adenovirus F 40/41 NOT DETECTED NOT DETECTED Final   Norovirus GI/GII NOT DETECTED NOT DETECTED Final   Sapovirus NOT DETECTED NOT DETECTED Final    Comment: (NOTE) Performed At: [facility] [place], [place] [id number] Rush Farmer MD DX:[id number]    Vibrio cholerae NOT DETECTED NOT DETECTED Final   Campylobacter by PCR NOT DETECTED NOT DETECTED Final   Salmonella by PCR NOT DETECTED NOT DETECTED Final   E coli (STEC) NOT DETECTED NOT  DETECTED Final   Enteroaggregative E coli NOT DETECTED NOT DETECTED Final   Shigella by PCR NOT DETECTED NOT DETECTED Final   Cyclospora cayetanensis NOT DETECTED NOT DETECTED Final   Astrovirus NOT DETECTED NOT DETECTED Final   G lamblia by PCR NOT DETECTED NOT DETECTED Final   Rotavirus A by PCR NOT DETECTED NOT DETECTED Final  C difficile quick scan w PCR reflex     Status: None   Collection Time: 10/18/19  7:48 AM   Specimen: STOOL  Result Value Ref Range Status   C Diff antigen NEGATIVE NEGATIVE Final   C Diff toxin NEGATIVE NEGATIVE Final   C Diff interpretation No C. difficile detected.  Final    Comment: Performed at [facility], [address] [place], [place] [address]         Radiology Studies: CT ABDOMEN PELVIS WO CONTRAST  Result Date: 10/21/2019 CLINICAL DATA:  Abdominal pain with nausea, vomiting and diarrhea for 4 days. History of chronic pancreatitis. EXAM: CT ABDOMEN AND PELVIS WITHOUT CONTRAST TECHNIQUE: Multidetector CT imaging of the abdomen and pelvis was performed following the standard protocol without IV contrast. COMPARISON:  CT abdomen dated 10/16/2019. FINDINGS: Lower chest: New small RIGHT pleural effusion. Hepatobiliary: No focal mass or lesion is seen within the liver. Intrahepatic bile duct dilatation and/or periportal edema. Status post cholecystectomy. Stable appearance of the mildly distended common bile duct. No CBD stone appreciated. Pancreas: Coarse calcifications again noted within the prominent pancreatic head. Configuration the pancreatic head appears stable in the short-term interval. There is stable dilatation of the pancreatic duct within the pancreatic head. Walls of the adjacent duodenum again appear thickened, presumably reactive in nature. Spleen: Normal in size without focal abnormality. Adrenals/Urinary Tract: Adrenal glands appear normal. Bilateral renal stones. No hydronephrosis. No ureteral or bladder calculi are identified.  Bladder is unremarkable, partially decompressed Stomach/Bowel: Surgical anastomosis at the rectosigmoid colon level, without obstruction, inflammation or other surgical complicating feature. No dilated large or small bowel loops. Presumed appendectomy. Stomach is unremarkable, partially decompressed. Vascular/Lymphatic:  Aorta bi-iliac stents in place. 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 No periaortic hemorrhage or edema appreciated. Bilateral renal artery stents are also in place. Heavy atherosclerotic changes are seen along the remainder of the abdominal aorta and aortic branch vessels. No enlarged lymph nodes seen in the abdomen or pelvis. Reproductive: Presumed hysterectomy. No adnexal mass or free fluid. Other: No free fluid or abscess collection is seen. No free intraperitoneal air. Musculoskeletal: No acute or suspicious osseous finding. Degenerative spondylosis of the slightly scoliotic thoracolumbar spine, mild to moderate in degree. IMPRESSION: 1. New small RIGHT pleural effusion. 2. Stable appearance of the enlarged pancreatic head, with associated coarse calcifications and pancreatic duct dilatation, and with small amount of fluid stranding again seen within the peripancreatic soft tissues corresponding to recent episode of acute pancreatitis (acute on chronic pancreatitis). 3. Walls of the adjacent duodenum again appear thickened, likely reactive in nature. 4. Aorta bi-iliac stents in place. 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 No periaortic hemorrhage or edema appreciated. 5. Bilateral nephrolithiasis. No hydronephrosis. No ureteral or bladder calculi. 6. No bowel obstruction. No secondary signs of bowel wall ischemia. No abscess collection or free intraperitoneal air. Aortic Atherosclerosis (ICD10-I70.0). Electronically Signed   By: Franki Cabot M.D.   On: 10/21/2019 13:46        Scheduled Meds: . allopurinol  100 mg Oral Daily  .  amLODipine  10 mg Oral QHS  . vitamin C  500 mg Oral BID  . carvedilol  25 mg Oral BID  . Chlorhexidine Gluconate Cloth  6 each Topical Q0600  . clopidogrel  75 mg Oral Daily  . dextrose  12.5 g Intravenous Once  . diphenhydrAMINE  50 mg Oral QHS  . epoetin (EPOGEN/PROCRIT) injection  4,000 Units Intravenous Q T,Th,Sa-HD  . feeding supplement  1 Container Oral TID BM  . feeding supplement (PRO-STAT SUGAR FREE 64)  30 mL Oral TID  . gabapentin  300 mg Oral QHS  . heparin injection (subcutaneous)  5,000 Units Subcutaneous Q8H  . hydrALAZINE  25 mg Oral Q8H  . insulin aspart  0-9 Units Subcutaneous Q4H  . lipase/protease/amylase  24,000 Units Oral TID AC  . multivitamin  1 tablet Oral QHS  . nicotine  21 mg Transdermal Daily  . pantoprazole  40 mg Oral Daily  . traZODone  150 mg Oral QHS   Continuous Infusions:   LOS: 7 days    Time spent: 35 minutes    Sidney Ace, MD [facility] Pager [phone]  If 7PM-7AM, please contact night-coverage  10/23/2019, 11:23 AM

## 2019-10-23 NOTE — Progress Notes (Signed)
Patient complained of pain of 8/10 at 2052. She requested her PRN dilaudid 1mg  IV as ordered. Vital signs stable. She received her scheduled benadryl 50mg  PO and trazadone 150mg  PO at 2214 and 2215 respectively. Vitals signs stable. Patient reminded this writter to bring in and administer her ordered dilaudid 1mg  Q2 even if she was sleeping. At 0048, vital signs stable except respirations at a rate of 12. Patient appears to be sleeping without any acute deviations. Will continue to monitor patient.

## 2019-10-23 NOTE — Progress Notes (Signed)
[address] [place], [place] [address] Phone [phone]. Fax [phone]  Date: 10/23/2019                  Patient Name:  [name]  MRN: [id number]  DOB: 1951/01/11  Age / Sex: 69 y.o., female          Subjective: Patient still having abdominal pain. Patient due for dialysis treatment today.  Current medications: Current Facility-Administered Medications  Medication Dose Route Frequency Provider Last Rate Last Admin  . albuterol (PROVENTIL) (2.5 MG/3ML) 0.083% nebulizer solution 3 mL  3 mL Inhalation Q4H PRN Sreenath, Sudheer B, MD      . allopurinol (ZYLOPRIM) tablet 100 mg  100 mg Oral Daily Priscella Mann, Sudheer B, MD   100 mg at 10/23/19 0916  . amLODipine (NORVASC) tablet 10 mg  10 mg Oral QHS Ralene Muskrat B, MD   10 mg at 10/22/19 2216  . ascorbic acid (VITAMIN C) tablet 500 mg  500 mg Oral BID Ralene Muskrat B, MD   500 mg at 10/23/19 0916  . carvedilol (COREG) tablet 25 mg  25 mg Oral BID Ralene Muskrat B, MD   25 mg at 10/23/19 0915  . Chlorhexidine Gluconate Cloth 2 % PADS 6 each  6 each Topical Q0600 Murlean Iba, MD   6 each at 10/23/19 0523  . clopidogrel (PLAVIX) tablet 75 mg  75 mg Oral Daily Ralene Muskrat B, MD   75 mg at 10/23/19 0916  . dextrose 50 % solution 12.5 g  12.5 g Intravenous Once Sharion Settler, NP      . diphenhydrAMINE (BENADRYL) capsule 50 mg  50 mg Oral QHS Ralene Muskrat B, MD   50 mg at 10/22/19 2214  . epoetin alfa (EPOGEN) injection 4,000 Units  4,000 Units Intravenous Q T,Th,Sa-HD Murlean Iba, MD   4,000 Units at 10/20/19 1001  . feeding supplement (BOOST / RESOURCE BREEZE) liquid 1 Container  1 Container Oral TID BM Sidney Ace, MD   1 Container at 10/22/19 1956  . feeding supplement (PRO-STAT SUGAR FREE 64) liquid 30 mL  30 mL Oral TID Ralene Muskrat B, MD   30 mL at 10/22/19 1704  . gabapentin (NEURONTIN) capsule 300 mg  300 mg Oral QHS Sreenath, Sudheer B, MD   300 mg at 10/22/19 2216  .  heparin injection 5,000 Units  5,000 Units Subcutaneous Q8H Athena Masse, MD   5,000 Units at 10/23/19 [phone]  . hydrALAZINE (APRESOLINE) tablet 25 mg  25 mg Oral Q8H Sreenath, Sudheer B, MD   25 mg at 10/23/19 [phone]  . HYDROmorphone (DILAUDID) injection 1 mg  1 mg Intravenous Q2H PRN Ralene Muskrat B, MD   1 mg at 10/23/19 0935  . HYDROmorphone (DILAUDID) tablet 2 mg  2 mg Oral Q4H PRN Sreenath, Sudheer B, MD      . insulin aspart (novoLOG) injection 0-9 Units  0-9 Units Subcutaneous Q4H Athena Masse, MD   3 Units at 10/22/19 1654  . lipase/protease/amylase (CREON) capsule 24,000 Units  24,000 Units Oral TID AC Ralene Muskrat B, MD   24,000 Units at 10/23/19 0915  . loperamide (IMODIUM) capsule 2 mg  2 mg Oral PRN Ralene Muskrat B, MD   2 mg at 10/19/19 1805  . multivitamin (RENA-VIT) tablet 1 tablet  1 tablet Oral QHS Ralene Muskrat B, MD   1 tablet at 10/22/19 2214  . nicotine (NICODERM CQ - dosed in mg/24 hours) patch 21 mg  21 mg  Transdermal Daily Ralene Muskrat B, MD   21 mg at 10/23/19 0916  . ondansetron (ZOFRAN) tablet 4 mg  4 mg Oral Q6H PRN Athena Masse, MD       Or  . ondansetron [facility]) injection 4 mg  4 mg Intravenous Q6H PRN Athena Masse, MD   4 mg at 10/22/19 2114  . pantoprazole (PROTONIX) EC tablet 40 mg  40 mg Oral Daily Ralene Muskrat B, MD   40 mg at 10/23/19 0915  . promethazine (PHENERGAN) injection 6.25 mg  6.25 mg Intravenous Q6H PRN Athena Masse, MD   6.25 mg at 10/23/19 0937  . traZODone (DESYREL) tablet 150 mg  150 mg Oral QHS Sreenath, Sudheer B, MD   150 mg at 10/22/19 2215     Vital Signs: Blood pressure 123/65, pulse (!) 57, temperature 98.6 F (37 C), temperature source Oral, resp. rate 17, height 5\' 5"  (1.651 m), weight 44.7 kg, SpO2 100 %.   Intake/Output Summary (Last 24 hours) at 10/23/2019 1214 Last data filed at 10/23/2019 0930 Gross per 24 hour  Intake 597 ml  Output 450 ml  Net 147 ml    Weight trends: Filed Weights    10/16/19 1647 10/17/19 0024 10/23/19 1040  Weight: 44 kg 44 kg 44.7 kg    Physical Exam: General:  Thin, frail, elderly female, laying in the bed  HEENT  anicteric, moist oral mucous membranes  Neck:  Supple, no mass  Lungs:  normal breathing effort, clear to auscultation  Heart::  No rub or gallop, irregular rhythm  Abdomen:  Soft, mild midepigastric tenderness  Extremities:  No peripheral edema  Neurologic:  Alert, oriented, able to answer questions  Skin:  Warm, dry  Access:  Left arm AV fistula    Lab results: Basic Metabolic Panel: Recent Labs  Lab 10/17/19 0250 10/18/19 1100 10/19/19 0709 10/19/19 0714 10/20/19 0441 10/21/19 0808  NA 136  --  138  --   --  137  K 4.3  --  3.8  --   --  3.7  CL 100  --  102  --   --  101  CO2 26  --  29  --   --  29  GLUCOSE 90  --  95  --   --  86  BUN 33*  --  13  --   --  11  CREATININE 3.61*  --  1.57*  --   --  1.46*  CALCIUM 7.9*  --  7.8*  --   --  7.8*  MG  --   --   --  1.6*  --  1.4*  PHOS  --  2.2*  --   --  4.2  --     Liver Function Tests: Recent Labs  Lab 10/17/19 0250  AST 18  ALT 14  ALKPHOS 131*  BILITOT 0.9  PROT 5.2*  ALBUMIN 2.4*   Recent Labs  Lab 10/16/19 1652  LIPASE 23   No results for input(s): AMMONIA in the last 168 hours.  CBC: Recent Labs  Lab 10/19/19 0714 10/19/19 0714 10/21/19 0808 10/23/19 0425  WBC 5.0   < > 6.1 8.8  NEUTROABS 3.8  --  4.0  --   HGB 8.9*   < > 8.9* 8.1*  HCT 26.9*   < > 27.9* 24.4*  MCV 103.9*   < > 106.1* 103.0*  PLT 157   < > 167 193   < > = values in  this interval not displayed.    Cardiac Enzymes: No results for input(s): CKTOTAL, TROPONINI in the last 168 hours.  BNP: Invalid input(s): POCBNP  CBG: Recent Labs  Lab 10/23/19 0047 10/23/19 0055 10/23/19 0355 10/23/19 0635 10/23/19 0818  GLUCAP 110* 101* 94 86 98    Microbiology: Recent Results (from the past 720 hour(s))  SARS CORONAVIRUS 2 (TAT 6-24 HRS) Nasopharyngeal  Nasopharyngeal Swab     Status: None   Collection Time: 10/16/19  9:18 PM   Specimen: Nasopharyngeal Swab  Result Value Ref Range Status   SARS Coronavirus 2 NEGATIVE NEGATIVE Final    Comment: (NOTE) SARS-CoV-2 target nucleic acids are NOT DETECTED. The SARS-CoV-2 RNA is generally detectable in upper and lower respiratory specimens during the acute phase of infection. Negative results do not preclude SARS-CoV-2 infection, do not rule out co-infections with other pathogens, and should not be used as the sole basis for treatment or other patient management decisions. Negative results must be combined with clinical observations, patient history, and epidemiological information. The expected result is Negative. Fact Sheet for Patients: SugarRoll.be Fact Sheet for Healthcare Providers: https://www.woods-mathews.com/ This test is not yet approved or cleared by the [place] FDA and  has been authorized for detection and/or diagnosis of SARS-CoV-2 by FDA under an Emergency Use Authorization (EUA). This EUA will remain  in effect (meaning this test can be used) for the duration of the COVID-19 declaration under Section 56 4(b)(1) of the Act, 21 U.S.C. section 360bbb-3(b)(1), unless the authorization is terminated or revoked sooner. Performed at [facility], [place] [address], [place], [place] [address]   GI pathogen panel by PCR, stool     Status: None   Collection Time: 10/18/19  7:48 AM   Specimen: Stool  Result Value Ref Range Status   Plesiomonas shigelloides NOT DETECTED NOT DETECTED Final   Yersinia enterocolitica NOT DETECTED NOT DETECTED Final   Vibrio NOT DETECTED NOT DETECTED Final   Enteropathogenic E coli NOT DETECTED NOT DETECTED Final   E coli (ETEC) LT/ST NOT DETECTED NOT DETECTED Final   E coli 0938 by PCR Not applicable NOT DETECTED Final   Cryptosporidium by PCR NOT DETECTED NOT DETECTED Final   Entamoeba histolytica  NOT DETECTED NOT DETECTED Final   Adenovirus F 40/41 NOT DETECTED NOT DETECTED Final   Norovirus GI/GII NOT DETECTED NOT DETECTED Final   Sapovirus NOT DETECTED NOT DETECTED Final    Comment: (NOTE) Performed At: [facility] [place], [place] [id number] Rush Farmer MD RC:[id number]    Vibrio cholerae NOT DETECTED NOT DETECTED Final   Campylobacter by PCR NOT DETECTED NOT DETECTED Final   Salmonella by PCR NOT DETECTED NOT DETECTED Final   E coli (STEC) NOT DETECTED NOT DETECTED Final   Enteroaggregative E coli NOT DETECTED NOT DETECTED Final   Shigella by PCR NOT DETECTED NOT DETECTED Final   Cyclospora cayetanensis NOT DETECTED NOT DETECTED Final   Astrovirus NOT DETECTED NOT DETECTED Final   G lamblia by PCR NOT DETECTED NOT DETECTED Final   Rotavirus A by PCR NOT DETECTED NOT DETECTED Final  C difficile quick scan w PCR reflex     Status: None   Collection Time: 10/18/19  7:48 AM   Specimen: STOOL  Result Value Ref Range Status   C Diff antigen NEGATIVE NEGATIVE Final   C Diff toxin NEGATIVE NEGATIVE Final   C Diff interpretation No C. difficile detected.  Final    Comment: Performed at [facility], [address] [place], [place] [address]     Coagulation Studies: No results for input(s): LABPROT, INR in the last 72 hours.  Urinalysis: No results for input(s): COLORURINE, LABSPEC, PHURINE, GLUCOSEU, HGBUR, BILIRUBINUR, KETONESUR, PROTEINUR, UROBILINOGEN, NITRITE, LEUKOCYTESUR in the last 72 hours.  Invalid input(s): APPERANCEUR    Imaging: No results found.   Assessment & Plan: Pt is a 69 y.o. Caucasian   female with  medical problems of ESRD, on hemodialysis, CAD/CABG 03/2011, COPD, atrial fibrillation, hypertension, COPD, chronic pancreatitis and chronic abdominal pain requiring recurrent hospitalizations , h/o AAA, cholecystectomy, severe peripheral arterial disease including bilateral renal artery stent, subclavian artery  stenosis, left, left CEA was admitted on 10/16/2019 with abdominal pain, diarrhea Acute pancreatitis [K85.90] Acute on chronic pancreatitis ([place]) [K85.90, K86.1]   [facility]/TTS/left arm AV fistula/[place]  #End-stage renal disease Patient due for dialysis treatment today.  Minimal ultrafiltration today as her p.o. intake has been diminished due to underlying pancreatitis.   #Acute pancreatitis Continue pain control, dietary modification, and pancreatic enzyme replacement.  #Anemia of chronic kidney disease Lab Results  Component Value Date   HGB 8.1 (L) 10/23/2019  Administer Epogen 4000 units IV with dialysis treatment today.    LOS: 7 Octavie Westerhold 2/16/202112:14 PM    Note: This note was prepared with Dragon dictation. Any transcription errors are unintentional

## 2019-10-23 NOTE — Progress Notes (Signed)
Nutrition Follow-up  DOCUMENTATION CODES:   Severe malnutrition in context of chronic illness  INTERVENTION:  Will initiate 48 hours calorie count to determine how well patient is meeting her calorie/protein needs. Calorie count results will be documented on 2/18.  Patient reports she likes [employer] and can drink 5 bottles per day until her appetite improves. Increase to Boost Breeze po 5 times daily, each supplement provides 250 kcal and 9 grams of protein.  Continue Pro-Stat 30 mL po BID, each supplement provides 100 kcal and 15 grams of protein.  Even if patient is able to meet her calorie/protein needs through oral nutrition supplements this is likely not sustainable at home due to the expense. It would be ideal if patient could meet a majority of her calorie/protein needs through meals and then supplement with a few oral nutrition supplements.  Continue Rena-vite QHS and vitamin C 500 mg BID.   NUTRITION DIAGNOSIS:   Severe Malnutrition related to chronic illness(ESRD on HD, COPD) as evidenced by percent weight loss, severe muscle depletion, severe fat depletion.  Ongoing.  GOAL:   Patient will meet greater than or equal to 90% of their needs  Progressing.  MONITOR:   PO intake, Supplement acceptance, Labs, Weight trends, Skin, I & O's  REASON FOR ASSESSMENT:   Malnutrition Screening Tool, Consult Assessment of nutrition requirement/status, Calorie Count  ASSESSMENT:   69 y.o. female with medical history significant for ESRD on HD TTS, CAD status post CABG X 3, COPD, A. fib, hypertension, COPD, GERD, DM, CHF, AAA and chronic pancreatitis with chronic pain on Creon and oral Dilaudid with recurrent hospitalizations for the same, most recently November 2020, who presents to the emergency room with a 3-day history of nausea and abdominal pain typical for her acute pancreatitis flares.   Discussed with MD via secure chat this morning. Patient with poor PO intake of  meals. If this continues may need to consider small-bore feeding tube that terminates in jejunum. Would need to be placed by diagnostic radiology as there is no [facility] service on this campus.  Met with patient while she was in HD today. She reports she has ongoing poor PO intake and her diet had to be downgraded to full liquids today. Yesterday she had bites of eggs at breakfast, was not able to have any of her lunch, and then she had bites of her fish for dinner last night. She reports she is actually tolerating her current oral nutrition supplements well (though she likes Boost Breeze better than Pro-Stat). She reports she is drinking all 3 bottles of Boost Breeze daily and both servings of Pro-Stat daily. This provides 950 kcal (68% minimum estimated kcal needs) and 57 grams of protein (81% minimum estimated protein needs). She reports she can drink up to 4-5 bottles of Boost Breeze per day. Patient cannot tolerate "milky" supplements such as Ensure.  Medications reviewed and include: allopurinol, vitamin C 500 mg BID, carvedilol, Epogen 4000 units with HD, gabapentin, Novolog 0-9 units Q4hrs, Creon 24000 units daily, Rena-vite QHS, nicotine patch, pantoprazole.  Labs reviewed: CBG 86-101.  Diet Order:   Diet Order            Diet full liquid Room service appropriate? Yes; Fluid consistency: Thin  Diet effective now             EDUCATION NEEDS:   Education needs have been addressed  Skin:  Skin Assessment: Reviewed RN Assessment(ecchymosis)  Last BM:  10/22/2019  Height:   Ht Readings  from Last 1 Encounters:  10/17/19 '5\' 5"'$  (1.651 m)   Weight:   Wt Readings from Last 1 Encounters:  10/23/19 44.7 kg   Ideal Body Weight:  56.8 kg  BMI:  Body mass index is 16.4 kg/m.  Estimated Nutritional Needs:   Kcal:  1400-1600kcal/day  Protein:  70-80g/day  Fluid:  UOP +1L  Jacklynn Barnacle, MS, RD, LDN Pager number available on Amion

## 2019-10-23 NOTE — Progress Notes (Signed)
Patient saturation on room air between 87-89. Administered 2L via N/C. Sat went up to 95.

## 2019-10-24 ENCOUNTER — Encounter: Payer: Self-pay | Admitting: Internal Medicine

## 2019-10-24 DIAGNOSIS — Z992 Dependence on renal dialysis: Secondary | ICD-10-CM

## 2019-10-24 DIAGNOSIS — N186 End stage renal disease: Secondary | ICD-10-CM

## 2019-10-24 DIAGNOSIS — I5032 Chronic diastolic (congestive) heart failure: Secondary | ICD-10-CM

## 2019-10-24 LAB — GLUCOSE, CAPILLARY
Glucose-Capillary: 141 mg/dL — ABNORMAL HIGH (ref 70–99)
Glucose-Capillary: 159 mg/dL — ABNORMAL HIGH (ref 70–99)
Glucose-Capillary: 164 mg/dL — ABNORMAL HIGH (ref 70–99)
Glucose-Capillary: 169 mg/dL — ABNORMAL HIGH (ref 70–99)
Glucose-Capillary: 241 mg/dL — ABNORMAL HIGH (ref 70–99)
Glucose-Capillary: 61 mg/dL — ABNORMAL LOW (ref 70–99)
Glucose-Capillary: 87 mg/dL (ref 70–99)
Glucose-Capillary: 97 mg/dL (ref 70–99)

## 2019-10-24 MED ORDER — DEXTROSE 50 % IV SOLN
25.0000 mL | Freq: Once | INTRAVENOUS | Status: AC
  Administered 2019-10-24: 25 mL via INTRAVENOUS

## 2019-10-24 MED ORDER — PANTOPRAZOLE SODIUM 40 MG PO TBEC
40.0000 mg | DELAYED_RELEASE_TABLET | Freq: Two times a day (BID) | ORAL | Status: DC
  Administered 2019-10-24 – 2019-10-25 (×2): 40 mg via ORAL
  Filled 2019-10-24 (×2): qty 1

## 2019-10-24 MED ORDER — DEXTROSE 50 % IV SOLN
INTRAVENOUS | Status: AC
  Filled 2019-10-24: qty 50

## 2019-10-24 NOTE — Progress Notes (Addendum)
Progress Note    [name]  OIZ:[id number] DOB: Jan 09, 1951  DOA: 10/16/2019 PCP: Bernerd Limbo, MD      Brief Narrative:    Medical records reviewed and are as summarized below:  [name] is an 69 y.o. female with medical history significant forESRD on HD TTS, CAD status post CABG, COPD, A. fib, hypertension,COPD and chronic pancreatitis with chronic pain on Creon and oral Dilaudid with recurrent hospitalizations for the same, most recently November 2020, who presents to the emergency room with a 3-day history of nausea and abdominal pain typical for her acute pancreatitis flares. Abdominal pain is mostly in the epigastrium of severe intensity and nonradiating, aggravated by attempting to eat or drink. She did vomit once on the day of arrival. She also reported she had been having a loose explosive bowel movement daily.     Assessment/Plan:   Principal Problem:   Acute on chronic pancreatitis (HCC) Active Problems:   Essential hypertension   Atrial fibrillation (HCC)   COPD with chronic bronchitis (HCC)   S/P CABG x 3   AAA (abdominal aortic aneurysm) without rupture (HCC)   Chronic diastolic congestive heart failure (HCC)   Type II diabetes mellitus with renal manifestations (HCC)   Recurrent pancreatitis   Chronic pain   ESRD on hemodialysis ([place])   Acute pancreatitis   Acute on chronic pancreatitis/recurrent pancreatitis: Continue IV Dilaudid for pain control and antiemetics as needed for nausea.  Trial of soft/regular diet today.  Patient is willing to give it a try.  GERD/hiatal hernia: Patient requested that vitamin C be discontinued because he thinks it may be exacerbating her GERD.  She also requested increasing Protonix.   Diarrhea: Improved.  C. difficile toxin and GI panel negative.  Imodium as needed.  ESRD on hemodialysis: Follow-up with with nephrologist for hemodialysis.  Hypertension: Continue antihypertensives  Atrial fibrillation:  Continue carvedilol.  CAD s/p CABG/AAA/chronic diastolic CHF: Continue Plavix.  Dialysis on schedule days for fluid management.  COPD: Compensated.  Continue bronchodilators.  Type II DM: NovoLog as needed for hyperglycemia.  Severe protein calorie malnutrition: Encouraged use of dietary supplements.  Follow-up with dietitian.  Body mass index is 15.85 kg/m.   Family Communication/Anticipated D/C date and plan/Code Status   DVT prophylaxis: Heparin Code Status: Full code Family Communication: Plan discussed with the patient Disposition Plan: Possible discharge to home tomorrow if abdominal pain improves and she is able to tolerate regular diet.      Subjective:   She complains of nausea and abdominal pain.  She grades abdominal pain is 8/10 in severity.  Objective:    Vitals:   10/24/19 0550 10/24/19 0554 10/24/19 0840 10/24/19 1554  BP: 125/78 125/78 134/68 135/65  Pulse:  (!) 57 61 (!) 58  Resp:  18  14  Temp:  98 F (36.7 C) 98.2 F (36.8 C) 98.6 F (37 C)  TempSrc:  Oral Oral Oral  SpO2:  100% 97% 100%  Weight:      Height:        Intake/Output Summary (Last 24 hours) at 10/24/2019 1802 Last data filed at 10/24/2019 1300 Gross per 24 hour  Intake 960 ml  Output 75 ml  Net 885 ml   Filed Weights   10/17/19 0024 10/23/19 1040 10/23/19 1421  Weight: 44 kg 44.7 kg 43.2 kg    Exam:  GEN: NAD SKIN: No rash EYES: EOMI ENT: MMM CV: RRR PULM: CTA B ABD: soft, ND, tenderness in  the epigastric and left upper quadrant areas without rebound tenderness or guarding., +BS CNS: AAO x 3, non focal EXT: No edema or tenderness   Data Reviewed:   I have personally reviewed following labs and imaging studies:  Labs: Labs show the following:   Basic Metabolic Panel: Recent Labs  Lab 10/18/19 1100 10/19/19 0709 10/19/19 0714 10/20/19 0441 10/21/19 0808  NA  --  138  --   --  137  K  --  3.8  --   --  3.7  CL  --  102  --   --  101  CO2  --  29  --    --  29  GLUCOSE  --  95  --   --  86  BUN  --  13  --   --  11  CREATININE  --  1.57*  --   --  1.46*  CALCIUM  --  7.8*  --   --  7.8*  MG  --   --  1.6*  --  1.4*  PHOS 2.2*  --   --  4.2  --    GFR Estimated Creatinine Clearance: 25.8 mL/min (A) (by C-G formula based on SCr of 1.46 mg/dL (H)). Liver Function Tests: No results for input(s): AST, ALT, ALKPHOS, BILITOT, PROT, ALBUMIN in the last 168 hours. No results for input(s): LIPASE, AMYLASE in the last 168 hours. No results for input(s): AMMONIA in the last 168 hours. Coagulation profile No results for input(s): INR, PROTIME in the last 168 hours.  CBC: Recent Labs  Lab 10/19/19 0714 10/21/19 0808 10/23/19 0425  WBC 5.0 6.1 8.8  NEUTROABS 3.8 4.0  --   HGB 8.9* 8.9* 8.1*  HCT 26.9* 27.9* 24.4*  MCV 103.9* 106.1* 103.0*  PLT 157 167 193   Cardiac Enzymes: No results for input(s): CKTOTAL, CKMB, CKMBINDEX, TROPONINI in the last 168 hours. BNP (last 3 results) No results for input(s): PROBNP in the last 8760 hours. CBG: Recent Labs  Lab 10/24/19 0410 10/24/19 0447 10/24/19 0850 10/24/19 1301 10/24/19 1642  GLUCAP 61* 141* 97 164* 159*   D-Dimer: No results for input(s): DDIMER in the last 72 hours. Hgb A1c: No results for input(s): HGBA1C in the last 72 hours. Lipid Profile: No results for input(s): CHOL, HDL, LDLCALC, TRIG, CHOLHDL, LDLDIRECT in the last 72 hours. Thyroid function studies: No results for input(s): TSH, T4TOTAL, T3FREE, THYROIDAB in the last 72 hours.  Invalid input(s): FREET3 Anemia work up: No results for input(s): VITAMINB12, FOLATE, FERRITIN, TIBC, IRON, RETICCTPCT in the last 72 hours. Sepsis Labs: Recent Labs  Lab 10/19/19 0714 10/21/19 0808 10/23/19 0425  WBC 5.0 6.1 8.8    Microbiology Recent Results (from the past 240 hour(s))  SARS CORONAVIRUS 2 (TAT 6-24 HRS) Nasopharyngeal Nasopharyngeal Swab     Status: None   Collection Time: 10/16/19  9:18 PM   Specimen:  Nasopharyngeal Swab  Result Value Ref Range Status   SARS Coronavirus 2 NEGATIVE NEGATIVE Final    Comment: (NOTE) SARS-CoV-2 target nucleic acids are NOT DETECTED. The SARS-CoV-2 RNA is generally detectable in upper and lower respiratory specimens during the acute phase of infection. Negative results do not preclude SARS-CoV-2 infection, do not rule out co-infections with other pathogens, and should not be used as the sole basis for treatment or other patient management decisions. Negative results must be combined with clinical observations, patient history, and epidemiological information. The expected result is Negative. Fact Sheet for Patients: SugarRoll.be Fact  Sheet for Healthcare Providers: https://www.woods-mathews.com/ This test is not yet approved or cleared by the [place] FDA and  has been authorized for detection and/or diagnosis of SARS-CoV-2 by FDA under an Emergency Use Authorization (EUA). This EUA will remain  in effect (meaning this test can be used) for the duration of the COVID-19 declaration under Section 56 4(b)(1) of the Act, 21 U.S.C. section 360bbb-3(b)(1), unless the authorization is terminated or revoked sooner. Performed at [facility], [place] [address], [place], [place] [address]   GI pathogen panel by PCR, stool     Status: None   Collection Time: 10/18/19  7:48 AM   Specimen: Stool  Result Value Ref Range Status   Plesiomonas shigelloides NOT DETECTED NOT DETECTED Final   Yersinia enterocolitica NOT DETECTED NOT DETECTED Final   Vibrio NOT DETECTED NOT DETECTED Final   Enteropathogenic E coli NOT DETECTED NOT DETECTED Final   E coli (ETEC) LT/ST NOT DETECTED NOT DETECTED Final   E coli 2263 by PCR Not applicable NOT DETECTED Final   Cryptosporidium by PCR NOT DETECTED NOT DETECTED Final   Entamoeba histolytica NOT DETECTED NOT DETECTED Final   Adenovirus F 40/41 NOT DETECTED NOT DETECTED Final    Norovirus GI/GII NOT DETECTED NOT DETECTED Final   Sapovirus NOT DETECTED NOT DETECTED Final    Comment: (NOTE) Performed At: [facility] [place], [place] [id number] Rush Farmer MD LS:[id number]    Vibrio cholerae NOT DETECTED NOT DETECTED Final   Campylobacter by PCR NOT DETECTED NOT DETECTED Final   Salmonella by PCR NOT DETECTED NOT DETECTED Final   E coli (STEC) NOT DETECTED NOT DETECTED Final   Enteroaggregative E coli NOT DETECTED NOT DETECTED Final   Shigella by PCR NOT DETECTED NOT DETECTED Final   Cyclospora cayetanensis NOT DETECTED NOT DETECTED Final   Astrovirus NOT DETECTED NOT DETECTED Final   G lamblia by PCR NOT DETECTED NOT DETECTED Final   Rotavirus A by PCR NOT DETECTED NOT DETECTED Final  C difficile quick scan w PCR reflex     Status: None   Collection Time: 10/18/19  7:48 AM   Specimen: STOOL  Result Value Ref Range Status   C Diff antigen NEGATIVE NEGATIVE Final   C Diff toxin NEGATIVE NEGATIVE Final   C Diff interpretation No C. difficile detected.  Final    Comment: Performed at [facility], [address] [place], [place] [address]    Procedures and diagnostic studies:  No results found.  Medications:   . allopurinol  100 mg Oral Daily  . amLODipine  10 mg Oral QHS  . carvedilol  25 mg Oral BID  . Chlorhexidine Gluconate Cloth  6 each Topical Q0600  . clopidogrel  75 mg Oral Daily  . dextrose  12.5 g Intravenous Once  . diphenhydrAMINE  50 mg Oral QHS  . epoetin (EPOGEN/PROCRIT) injection  4,000 Units Intravenous Q T,Th,Sa-HD  . feeding supplement  1 Container Oral 5 X Daily  . feeding supplement (PRO-STAT SUGAR FREE 64)  30 mL Oral TID  . gabapentin  300 mg Oral QHS  . heparin injection (subcutaneous)  5,000 Units Subcutaneous Q8H  . hydrALAZINE  25 mg Oral Q8H  . insulin aspart  0-9 Units Subcutaneous Q4H  . lipase/protease/amylase  24,000 Units Oral TID AC  . multivitamin  1 tablet Oral QHS  .  nicotine  21 mg Transdermal Daily  . pantoprazole  40 mg Oral BID  . traZODone  150 mg Oral QHS   Continuous Infusions:   LOS: 8 days   Kaytlynne Neace  [facility]     10/24/2019, 6:02 PM

## 2019-10-24 NOTE — Progress Notes (Signed)
Pt alert and oriented, BG at 0410 hrs was 61 mg/dL, dextrose 50% solution 25 ml administered at 0431 hrs . BG rechecked at 0447 hrs, it was 141 mg/dL.  No deviation from baseline, Pt continues on dilaudid q2h for pain r/t acute on chronic pancreatitis.

## 2019-10-24 NOTE — Care Management Important Message (Signed)
Important Message  Patient Details  Name: DONALDA JOB MRN: [id number] Date of Birth: [birth year]   Medicare Important Message Given:  Yes     Juliann Pulse A Agripina Guyette 10/24/2019, 10:36 AM

## 2019-10-24 NOTE — Progress Notes (Signed)
[address] [place], [place] [address] Phone [phone]. Fax [phone]  Date: 10/24/2019                  Patient Name:  [name]  MRN: [id number]  DOB: November 04, 1950  Age / Sex: 69 y.o., female          Subjective: Patient reports that at outpatient dialysis 16-gauge needles were used. Patient also a bit upset that her diet tray has been incorrect at times.   Current medications: Current Facility-Administered Medications  Medication Dose Route Frequency Provider Last Rate Last Admin  . albuterol (PROVENTIL) (2.5 MG/3ML) 0.083% nebulizer solution 3 mL  3 mL Inhalation Q4H PRN Sreenath, Sudheer B, MD      . allopurinol (ZYLOPRIM) tablet 100 mg  100 mg Oral Daily Priscella Mann, Sudheer B, MD   100 mg at 10/24/19 1047  . amLODipine (NORVASC) tablet 10 mg  10 mg Oral QHS Sreenath, Sudheer B, MD   10 mg at 10/23/19 2109  . ascorbic acid (VITAMIN C) tablet 500 mg  500 mg Oral BID Ralene Muskrat B, MD   500 mg at 10/24/19 0845  . carvedilol (COREG) tablet 25 mg  25 mg Oral BID Ralene Muskrat B, MD   25 mg at 10/24/19 1046  . Chlorhexidine Gluconate Cloth 2 % PADS 6 each  6 each Topical Q0600 Murlean Iba, MD   6 each at 10/24/19 0559  . clopidogrel (PLAVIX) tablet 75 mg  75 mg Oral Daily Ralene Muskrat B, MD   75 mg at 10/24/19 1046  . dextrose 50 % solution 12.5 g  12.5 g Intravenous Once Sharion Settler, NP      . dextrose 50 % solution           . diphenhydrAMINE (BENADRYL) capsule 50 mg  50 mg Oral QHS Ralene Muskrat B, MD   50 mg at 10/23/19 2106  . epoetin alfa (EPOGEN) injection 4,000 Units  4,000 Units Intravenous Q T,Th,Sa-HD Murlean Iba, MD   4,000 Units at 10/23/19 1327  . feeding supplement (BOOST / RESOURCE BREEZE) liquid 1 Container  1 Container Oral 5 X Daily Ralene Muskrat B, MD   1 Container at 10/24/19 1322  . feeding supplement (PRO-STAT SUGAR FREE 64) liquid 30 mL  30 mL Oral TID Ralene Muskrat B, MD   30 mL at 10/24/19 1506  .  gabapentin (NEURONTIN) capsule 300 mg  300 mg Oral QHS Sreenath, Sudheer B, MD   300 mg at 10/23/19 2105  . heparin injection 5,000 Units  5,000 Units Subcutaneous Q8H Athena Masse, MD   5,000 Units at 10/24/19 1322  . hydrALAZINE (APRESOLINE) tablet 25 mg  25 mg Oral Q8H Sreenath, Sudheer B, MD   25 mg at 10/24/19 1311  . HYDROmorphone (DILAUDID) injection 1 mg  1 mg Intravenous Q2H PRN Ralene Muskrat B, MD   1 mg at 10/24/19 1511  . HYDROmorphone (DILAUDID) tablet 2 mg  2 mg Oral Q4H PRN Sreenath, Sudheer B, MD      . insulin aspart (novoLOG) injection 0-9 Units  0-9 Units Subcutaneous Q4H Athena Masse, MD   2 Units at 10/24/19 1311  . lipase/protease/amylase (CREON) capsule 24,000 Units  24,000 Units Oral TID AC Ralene Muskrat B, MD   24,000 Units at 10/24/19 1203  . loperamide (IMODIUM) capsule 2 mg  2 mg Oral PRN Ralene Muskrat B, MD   2 mg at 10/19/19 1805  . multivitamin (RENA-VIT) tablet 1 tablet  1  tablet Oral QHS Ralene Muskrat B, MD   1 tablet at 10/23/19 2116  . nicotine (NICODERM CQ - dosed in mg/24 hours) patch 21 mg  21 mg Transdermal Daily Ralene Muskrat B, MD   21 mg at 10/24/19 1049  . ondansetron (ZOFRAN) tablet 4 mg  4 mg Oral Q6H PRN Athena Masse, MD       Or  . ondansetron [facility]) injection 4 mg  4 mg Intravenous Q6H PRN Athena Masse, MD   4 mg at 10/24/19 0049  . pantoprazole (PROTONIX) EC tablet 40 mg  40 mg Oral Daily Ralene Muskrat B, MD   40 mg at 10/24/19 1046  . promethazine (PHENERGAN) injection 6.25 mg  6.25 mg Intravenous Q6H PRN Athena Masse, MD   6.25 mg at 10/23/19 0937  . traZODone (DESYREL) tablet 150 mg  150 mg Oral QHS Priscella Mann, Sudheer B, MD   150 mg at 10/23/19 2111     Vital Signs: Blood pressure 134/68, pulse 61, temperature 98.2 F (36.8 C), temperature source Oral, resp. rate 18, height 5\' 5"  (1.651 m), weight 43.2 kg, SpO2 97 %.   Intake/Output Summary (Last 24 hours) at 10/24/2019 1538 Last data filed at 10/24/2019  1300 Gross per 24 hour  Intake 960 ml  Output 75 ml  Net 885 ml    Weight trends: Filed Weights   10/17/19 0024 10/23/19 1040 10/23/19 1421  Weight: 44 kg 44.7 kg 43.2 kg    Physical Exam: General:  Thin, frail, elderly female, laying in the bed  HEENT  anicteric, moist oral mucous membranes  Neck:  Supple, no mass  Lungs:  normal breathing effort, clear to auscultation  Heart::  No rub or gallop, irregular rhythm  Abdomen:  Soft, mild midepigastric tenderness  Extremities:  No peripheral edema  Neurologic:  Alert, oriented, able to answer questions  Skin:  Warm, dry  Access:  Left arm AV fistula    Lab results: Basic Metabolic Panel: Recent Labs  Lab 10/18/19 1100 10/19/19 0709 10/19/19 0714 10/20/19 0441 10/21/19 0808  NA  --  138  --   --  137  K  --  3.8  --   --  3.7  CL  --  102  --   --  101  CO2  --  29  --   --  29  GLUCOSE  --  95  --   --  86  BUN  --  13  --   --  11  CREATININE  --  1.57*  --   --  1.46*  CALCIUM  --  7.8*  --   --  7.8*  MG  --   --  1.6*  --  1.4*  PHOS 2.2*  --   --  4.2  --     Liver Function Tests: No results for input(s): AST, ALT, ALKPHOS, BILITOT, PROT, ALBUMIN in the last 168 hours. No results for input(s): LIPASE, AMYLASE in the last 168 hours. No results for input(s): AMMONIA in the last 168 hours.  CBC: Recent Labs  Lab 10/19/19 0714 10/19/19 0714 10/21/19 0808 10/23/19 0425  WBC 5.0   < > 6.1 8.8  NEUTROABS 3.8  --  4.0  --   HGB 8.9*   < > 8.9* 8.1*  HCT 26.9*   < > 27.9* 24.4*  MCV 103.9*   < > 106.1* 103.0*  PLT 157   < > 167 193   < > =  values in this interval not displayed.    Cardiac Enzymes: No results for input(s): CKTOTAL, TROPONINI in the last 168 hours.  BNP: Invalid input(s): POCBNP  CBG: Recent Labs  Lab 10/24/19 0029 10/24/19 0410 10/24/19 0447 10/24/19 0850 10/24/19 1301  GLUCAP 241* 61* 141* 97 164*    Microbiology: Recent Results (from the past 720 hour(s))  SARS  CORONAVIRUS 2 (TAT 6-24 HRS) Nasopharyngeal Nasopharyngeal Swab     Status: None   Collection Time: 10/16/19  9:18 PM   Specimen: Nasopharyngeal Swab  Result Value Ref Range Status   SARS Coronavirus 2 NEGATIVE NEGATIVE Final    Comment: (NOTE) SARS-CoV-2 target nucleic acids are NOT DETECTED. The SARS-CoV-2 RNA is generally detectable in upper and lower respiratory specimens during the acute phase of infection. Negative results do not preclude SARS-CoV-2 infection, do not rule out co-infections with other pathogens, and should not be used as the sole basis for treatment or other patient management decisions. Negative results must be combined with clinical observations, patient history, and epidemiological information. The expected result is Negative. Fact Sheet for Patients: SugarRoll.be Fact Sheet for Healthcare Providers: https://www.woods-mathews.com/ This test is not yet approved or cleared by the [place] FDA and  has been authorized for detection and/or diagnosis of SARS-CoV-2 by FDA under an Emergency Use Authorization (EUA). This EUA will remain  in effect (meaning this test can be used) for the duration of the COVID-19 declaration under Section 56 4(b)(1) of the Act, 21 U.S.C. section 360bbb-3(b)(1), unless the authorization is terminated or revoked sooner. Performed at [facility], [place] [address], [place], [place] [address]   GI pathogen panel by PCR, stool     Status: None   Collection Time: 10/18/19  7:48 AM   Specimen: Stool  Result Value Ref Range Status   Plesiomonas shigelloides NOT DETECTED NOT DETECTED Final   Yersinia enterocolitica NOT DETECTED NOT DETECTED Final   Vibrio NOT DETECTED NOT DETECTED Final   Enteropathogenic E coli NOT DETECTED NOT DETECTED Final   E coli (ETEC) LT/ST NOT DETECTED NOT DETECTED Final   E coli 1660 by PCR Not applicable NOT DETECTED Final   Cryptosporidium by PCR NOT DETECTED  NOT DETECTED Final   Entamoeba histolytica NOT DETECTED NOT DETECTED Final   Adenovirus F 40/41 NOT DETECTED NOT DETECTED Final   Norovirus GI/GII NOT DETECTED NOT DETECTED Final   Sapovirus NOT DETECTED NOT DETECTED Final    Comment: (NOTE) Performed At: [facility] [place], [place] [id number] Rush Farmer MD NA:[id number]    Vibrio cholerae NOT DETECTED NOT DETECTED Final   Campylobacter by PCR NOT DETECTED NOT DETECTED Final   Salmonella by PCR NOT DETECTED NOT DETECTED Final   E coli (STEC) NOT DETECTED NOT DETECTED Final   Enteroaggregative E coli NOT DETECTED NOT DETECTED Final   Shigella by PCR NOT DETECTED NOT DETECTED Final   Cyclospora cayetanensis NOT DETECTED NOT DETECTED Final   Astrovirus NOT DETECTED NOT DETECTED Final   G lamblia by PCR NOT DETECTED NOT DETECTED Final   Rotavirus A by PCR NOT DETECTED NOT DETECTED Final  C difficile quick scan w PCR reflex     Status: None   Collection Time: 10/18/19  7:48 AM   Specimen: STOOL  Result Value Ref Range Status   C Diff antigen NEGATIVE NEGATIVE Final   C Diff toxin NEGATIVE NEGATIVE Final   C Diff interpretation No C. difficile detected.  Final    Comment: Performed  at [facility], [address] [place], [place] [address]     Coagulation Studies: No results for input(s): LABPROT, INR in the last 72 hours.  Urinalysis: No results for input(s): COLORURINE, LABSPEC, PHURINE, GLUCOSEU, HGBUR, BILIRUBINUR, KETONESUR, PROTEINUR, UROBILINOGEN, NITRITE, LEUKOCYTESUR in the last 72 hours.  Invalid input(s): APPERANCEUR    Imaging: No results found.   Assessment & Plan: Pt is a 69 y.o. Caucasian   female with  medical problems of ESRD, on hemodialysis, CAD/CABG 03/2011, COPD, atrial fibrillation, hypertension, COPD, chronic pancreatitis and chronic abdominal pain requiring recurrent hospitalizations , h/o AAA, cholecystectomy, severe peripheral arterial disease including bilateral  renal artery stent, subclavian artery stenosis, left, left CEA was admitted on 10/16/2019 with abdominal pain, diarrhea Acute pancreatitis [K85.90] Acute on chronic pancreatitis ([place]) [K85.90, K86.1]   [facility]/TTS/left arm AV fistula/[facility] [place]  #End-stage renal disease Patient completed dialysis treatment yesterday.  Tolerated well.  Patient request use of 16-gauge needles.   #Acute pancreatitis Maintain the patient on treatment with pain control and slowly progress diet, as well as pancreatic enzyme replacement.  #Anemia of chronic kidney disease Lab Results  Component Value Date   HGB 8.1 (L) 10/23/2019  Continue Epogen with dialysis treatments.    LOS: 8 Rocquel Askren 2/17/20213:38 PM    Note: This note was prepared with Dragon dictation. Any transcription errors are unintentional
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
## 2019-10-25 LAB — PHOSPHORUS: Phosphorus: 2.1 mg/dL — ABNORMAL LOW (ref 2.5–4.6)

## 2019-10-25 LAB — GLUCOSE, CAPILLARY
Glucose-Capillary: 118 mg/dL — ABNORMAL HIGH (ref 70–99)
Glucose-Capillary: 93 mg/dL (ref 70–99)
Glucose-Capillary: 95 mg/dL (ref 70–99)

## 2019-10-25 LAB — POTASSIUM
Potassium: 2.3 mmol/L — CL (ref 3.5–5.1)
Potassium: 3.3 mmol/L — ABNORMAL LOW (ref 3.5–5.1)

## 2019-10-25 MED ORDER — HYDROMORPHONE HCL 1 MG/ML IJ SOLN
1.0000 mg | INTRAMUSCULAR | Status: DC | PRN
  Administered 2019-10-25: 1 mg via INTRAVENOUS
  Filled 2019-10-25: qty 1

## 2019-10-25 NOTE — Discharge Instructions (Signed)
[facility] Stay Proper nutrition can help your body recover from illness and injury.   Foods and beverages high in protein, vitamins, and minerals help rebuild muscle loss, promote healing, & reduce fall risk.   .In addition to eating healthy foods, a nutrition shake is an easy, delicious way to get the nutrition you need during and after your hospital stay  It is recommended that you continue to drink 3-4 bottles per day of:       Boost Breeze for at least 1 month (30 days) after your hospital stay  You were also drinking Pro-Stat 30 mL twice daily As your intake at meals increases you can decrease your intake of nutrition shakes.  Tips for adding a nutrition shake into your routine: As allowed, drink one with vitamins or medications instead of water or juice Enjoy one as a tasty mid-morning or afternoon snack Drink cold or make a milkshake out of it Drink one instead of milk with cereal or snacks Use as a coffee creamer   Available at the following grocery stores and pharmacies:           * [facility] [phone]            For COUPONS visit: www.ensure.com/join or http://dawson-may.com/   Suggested Substitutions Ensure Plus = Boost Plus = Carnation Breakfast Essentials = Boost Compact Ensure Active Clear = Boost Breeze Glucerna Shake = Boost Glucose Control = Carnation Breakfast Essentials SUGAR FREE  Please call with any nutrition questions. Jacklynn Barnacle, MS, RD, LDN  Office Number: [phone]

## 2019-10-25 NOTE — Progress Notes (Signed)
Hd completed 

## 2019-10-25 NOTE — Discharge Summary (Addendum)
Physician Discharge Summary  Amy Amini Benzel CHE:[id number] DOB: [birth year] DOA: 10/16/2019  PCP: Bernerd Limbo, MD  Admit date: 10/16/2019 Discharge date: 10/25/2019  Discharge disposition: Home   Recommendations for Outpatient Follow-Up:   Follow up with PCP in 1 week   Discharge Diagnosis:   Principal Problem:   Acute on chronic pancreatitis [facility]) Active Problems:   Essential hypertension   Atrial fibrillation ([place])   COPD with chronic bronchitis ([place])   S/P CABG x 3   AAA (abdominal aortic aneurysm) without rupture (HCC)   Chronic diastolic congestive heart failure (HCC)   Type II diabetes mellitus with renal manifestations (HCC)   Recurrent pancreatitis   Chronic pain   ESRD on hemodialysis ([place])   Acute pancreatitis    Discharge Condition: Stable.  Diet recommendation: Renal and diabetic diet  Code status: Full code    Hospital Course:   [name] is an 69 y.o. female with medical history significant forESRD on HD TTS, CAD status post CABG, COPD, A. fib, hypertension,COPD and chronic pancreatitis with chronic pain on Creon and oral Dilaudid with recurrent hospitalizations for the same, most recently November 2020, who presented to the emergency room with a 3-day history of nausea and abdominal pain typical for her acute pancreatitis flares. Abdominal pain was mostly in the epigastrium of severe intensity and nonradiating, aggravated by attempting to eat or drink. She did vomit once on the day of arrival. She also reported she had been having a loose explosive bowel movement daily.   She was admitted for acute on chronic pancreatitis. She required IV dilaudid and antiemetics for several days because of severe abdominal pain and nausea. Symptoms slowly improved. It was recommended that Dilaudid be slowly tapered off. However, patient said she was in the hospital for IV Dilaudid so if IV Dilaudid was going to be tapered off then she would rather go home and  take her oral Dilaudid for pain control. She said she had gotten to a point where she felt comfortable going home. She also had hypokalemia that was treated. She was deemed stable for discharge to home.     Discharge Exam:   Vitals:   10/25/19 0000 10/25/19 0802  BP: 130/77 (!) 132/54  Pulse: (!) 59 (!) 56  Resp: 18 17  Temp: 98.6 F (37 C) 98.2 F (36.8 C)  SpO2: 100% 100%   Vitals:   10/24/19 1554 10/24/19 2200 10/25/19 0000 10/25/19 0802  BP: 135/65 (!) 145/45 130/77 (!) 132/54  Pulse: (!) 58 61 (!) 59 (!) 56  Resp: 14  18 17   Temp: 98.6 F (37 C)  98.6 F (37 C) 98.2 F (36.8 C)  TempSrc: Oral  Oral Oral  SpO2: 100%  100% 100%  Weight:      Height:         GEN: NAD SKIN: No rash EYES: EOMI ENT: MMM CV: RRR PULM: CTA B ABD: soft, ND, mild epigastric tenderness but no rebound tenderness or guarding, +BS CNS: AAO x 3, non focal EXT: No edema or tenderness   The results of significant diagnostics from this hospitalization (including imaging, microbiology, ancillary and laboratory) are listed below for reference.     Procedures and Diagnostic Studies:   CT ABDOMEN PELVIS WO CONTRAST  Result Date: 10/16/2019 CLINICAL DATA:  Abdominal pain EXAM: CT ABDOMEN AND PELVIS WITHOUT CONTRAST TECHNIQUE: Multidetector CT imaging of the abdomen and pelvis was performed following the standard protocol without IV contrast. COMPARISON:  July 27, 2019  FINDINGS: Lower chest: The visualized heart size within normal limits. No pericardial fluid/thickening. No hiatal hernia. Streaky atelectasis seen at the posterior right lung base. Hepatobiliary: Although limited due to the lack of intravenous contrast, normal in appearance without gross focal abnormality. The patient is status post cholecystectomy. There is stable mildly dilated common bile duct. No common bile duct stones however are seen. Pancreas: There is fat stranding changes and enlargement seen of the pancreatic head with  diffuse coarse calcifications seen within the pancreatic head and proximal body. There is also resultant surrounding inflammatory changes and wall thickening seen within the first and second portion of the duodenum. No loculated fluid collection is seen. No pneumoperitoneum. Spleen: Normal in size. Although limited due to the lack of intravenous contrast, normal in appearance. Adrenals/Urinary Tract: Both adrenal glands appear normal. Multiple bilateral calcifications are seen within the renal hilum, likely vascular and possible punctate renal calculi in the lower poles. No hydronephrosis. No ureteral or bladder calculi are noted. Stomach/Bowel: The stomach Abner major of the small bowel are normal appearance. There is been a prior surgical anastomosis within the sigmoid rectal junction. Scattered colonic diverticula are noted without diverticulitis. Vascular/Lymphatic: There are no enlarged abdominal or pelvic lymph nodes. The patient is status post aorta bi-iliac stent graft. There is unchanged native aneurysm sac measuring 3.9 cm. There is also a bilateral renal arterial stents at the ostia. Dense vascular calcifications are seen throughout. Reproductive: The patient is status post hysterectomy. No adnexal masses or collections seen. Other: No evidence of abdominal wall mass or hernia. Musculoskeletal: No acute or significant osseous findings. 6 IMPRESSION: 1. Findings consistent with acute on chronic pancreatitis involving pancreatic head and proximal body. There is adjacent resultant duodenitis. No pneumoperitoneum or loculated fluid collections seen. 2. Aorta bi-iliac stent graft with unchanged native aneurysm sac size measuring 3.9 cm. 3. Diverticulosis without diverticulitis. Electronically Signed   By: Prudencio Pair M.D.   On: 10/16/2019 19:21     Labs:   Basic Metabolic Panel: Recent Labs  Lab 10/18/19 1100 10/19/19 0709 10/19/19 0714 10/20/19 0441 10/21/19 0808  NA  --  138  --   --  137  K   --  3.8  --   --  3.7  CL  --  102  --   --  101  CO2  --  29  --   --  29  GLUCOSE  --  95  --   --  86  BUN  --  13  --   --  11  CREATININE  --  1.57*  --   --  1.46*  CALCIUM  --  7.8*  --   --  7.8*  MG  --   --  1.6*  --  1.4*  PHOS 2.2*  --   --  4.2  --    GFR Estimated Creatinine Clearance: 25.8 mL/min (A) (by C-G formula based on SCr of 1.46 mg/dL (H)). Liver Function Tests: No results for input(s): AST, ALT, ALKPHOS, BILITOT, PROT, ALBUMIN in the last 168 hours. No results for input(s): LIPASE, AMYLASE in the last 168 hours. No results for input(s): AMMONIA in the last 168 hours. Coagulation profile No results for input(s): INR, PROTIME in the last 168 hours.  CBC: Recent Labs  Lab 10/19/19 0714 10/21/19 0808 10/23/19 0425  WBC 5.0 6.1 8.8  NEUTROABS 3.8 4.0  --   HGB 8.9* 8.9* 8.1*  HCT 26.9* 27.9* 24.4*  MCV 103.9* 106.1* 103.0*  PLT 157 167 193   Cardiac Enzymes: No results for input(s): CKTOTAL, CKMB, CKMBINDEX, TROPONINI in the last 168 hours. BNP: Invalid input(s): POCBNP CBG: Recent Labs  Lab 10/24/19 1642 10/24/19 2105 10/25/19 0010 10/25/19 0447 10/25/19 0801  GLUCAP 159* 87 118* 95 93   D-Dimer No results for input(s): DDIMER in the last 72 hours. Hgb A1c No results for input(s): HGBA1C in the last 72 hours. Lipid Profile No results for input(s): CHOL, HDL, LDLCALC, TRIG, CHOLHDL, LDLDIRECT in the last 72 hours. Thyroid function studies No results for input(s): TSH, T4TOTAL, T3FREE, THYROIDAB in the last 72 hours.  Invalid input(s): FREET3 Anemia work up No results for input(s): VITAMINB12, FOLATE, FERRITIN, TIBC, IRON, RETICCTPCT in the last 72 hours. Microbiology Recent Results (from the past 240 hour(s))  SARS CORONAVIRUS 2 (TAT 6-24 HRS) Nasopharyngeal Nasopharyngeal Swab     Status: None   Collection Time: 10/16/19  9:18 PM   Specimen: Nasopharyngeal Swab  Result Value Ref Range Status   SARS Coronavirus 2 NEGATIVE NEGATIVE  Final    Comment: (NOTE) SARS-CoV-2 target nucleic acids are NOT DETECTED. The SARS-CoV-2 RNA is generally detectable in upper and lower respiratory specimens during the acute phase of infection. Negative results do not preclude SARS-CoV-2 infection, do not rule out co-infections with other pathogens, and should not be used as the sole basis for treatment or other patient management decisions. Negative results must be combined with clinical observations, patient history, and epidemiological information. The expected result is Negative. Fact Sheet for Patients: SugarRoll.be Fact Sheet for Healthcare Providers: https://www.woods-mathews.com/ This test is not yet approved or cleared by the [place] FDA and  has been authorized for detection and/or diagnosis of SARS-CoV-2 by FDA under an Emergency Use Authorization (EUA). This EUA will remain  in effect (meaning this test can be used) for the duration of the COVID-19 declaration under Section 56 4(b)(1) of the Act, 21 U.S.C. section 360bbb-3(b)(1), unless the authorization is terminated or revoked sooner. Performed at [facility], [place] [address], [place], [place] [address]   GI pathogen panel by PCR, stool     Status: None   Collection Time: 10/18/19  7:48 AM   Specimen: Stool  Result Value Ref Range Status   Plesiomonas shigelloides NOT DETECTED NOT DETECTED Final   Yersinia enterocolitica NOT DETECTED NOT DETECTED Final   Vibrio NOT DETECTED NOT DETECTED Final   Enteropathogenic E coli NOT DETECTED NOT DETECTED Final   E coli (ETEC) LT/ST NOT DETECTED NOT DETECTED Final   E coli 0623 by PCR Not applicable NOT DETECTED Final   Cryptosporidium by PCR NOT DETECTED NOT DETECTED Final   Entamoeba histolytica NOT DETECTED NOT DETECTED Final   Adenovirus F 40/41 NOT DETECTED NOT DETECTED Final   Norovirus GI/GII NOT DETECTED NOT DETECTED Final   Sapovirus NOT DETECTED NOT DETECTED  Final    Comment: (NOTE) Performed At: [facility] [place], [place] [id number] Rush Farmer MD OH:[id number]    Vibrio cholerae NOT DETECTED NOT DETECTED Final   Campylobacter by PCR NOT DETECTED NOT DETECTED Final   Salmonella by PCR NOT DETECTED NOT DETECTED Final   E coli (STEC) NOT DETECTED NOT DETECTED Final   Enteroaggregative E coli NOT DETECTED NOT DETECTED Final   Shigella by PCR NOT DETECTED NOT DETECTED Final   Cyclospora cayetanensis NOT DETECTED NOT DETECTED Final   Astrovirus NOT DETECTED NOT DETECTED Final   G lamblia by PCR NOT DETECTED NOT DETECTED Final  Rotavirus A by PCR NOT DETECTED NOT DETECTED Final  C difficile quick scan w PCR reflex     Status: None   Collection Time: 10/18/19  7:48 AM   Specimen: STOOL  Result Value Ref Range Status   C Diff antigen NEGATIVE NEGATIVE Final   C Diff toxin NEGATIVE NEGATIVE Final   C Diff interpretation No C. difficile detected.  Final    Comment: Performed at [facility], [address] [place], [place] [address]     Discharge Instructions:   Discharge Instructions    Diet renal 60/70-10-08-1198   Complete by: As directed    Increase activity slowly   Complete by: As directed      Allergies as of 10/25/2019      Reactions   Isosorbide Other (See Comments)   Can only tolerate in low doses (unknown reaction) Other reaction(s): Other Unknown "FELT LIKE I WAS GOING TO EXPLODE FROM INSIDE OUT"   Codeine Itching   Contrast Media [iodinated Diagnostic Agents] Itching, Other (See Comments)   Itching of feet   Ioxaglate Itching, Other (See Comments)   Itching of feet   Isosorbide Nitrate Other (See Comments)   Unknown   Metrizamide Itching, Other (See Comments)   Itching of feet   Oxycodone-acetaminophen Itching   Percocet [oxycodone-acetaminophen] Itching, Other (See Comments)   Tolerates Hydrocodone      Medication List    TAKE these medications   allopurinol 100 MG  tablet Commonly known as: ZYLOPRIM Take 100 mg by mouth daily.   amLODipine 5 MG tablet Commonly known as: NORVASC Take 1 tablet (5 mg total) by mouth daily. What changed: how much to take   atorvastatin 40 MG tablet Commonly known as: LIPITOR Take 40 mg by mouth daily.   carvedilol 25 MG tablet Commonly known as: COREG Take 25 mg by mouth 2 (two) times daily.   clopidogrel 75 MG tablet Commonly known as: PLAVIX Take 75 mg by mouth daily.   Creon 12000 units Cpep capsule Generic drug: lipase/protease/amylase Take 2 capsules (24,000 Units total) by mouth 3 (three) times daily before meals.   Dialyvite 800-Zinc 15 0.8 MG Tabs Take 1 tablet by mouth daily.   diphenhydrAMINE 25 MG tablet Commonly known as: BENADRYL Take 50 mg by mouth at bedtime.   gabapentin 300 MG capsule Commonly known as: NEURONTIN Take 1 capsule (300 mg total) by mouth at bedtime.   hydrALAZINE 25 MG tablet Commonly known as: APRESOLINE Take 1 tablet (25 mg total) by mouth every 8 (eight) hours.   HYDROmorphone 2 MG tablet Commonly known as: DILAUDID Take 2-4 mg by mouth every 4 (four) hours.   OXYGEN Inhale 2 L into the lungs at bedtime.   pantoprazole 40 MG tablet Commonly known as: PROTONIX Take 1 tablet (40 mg total) by mouth daily.   ProAir RespiClick 829 (90 Base) MCG/ACT Aepb Generic drug: Albuterol Sulfate Inhale 1 puff into the lungs every 4 (four) hours as needed (shortness of breath/wheezing).   promethazine 12.5 MG tablet Commonly known as: PHENERGAN Take 12.5 mg by mouth every 4 (four) hours as needed for nausea or vomiting.   traZODone 100 MG tablet Commonly known as: DESYREL Take 150 mg by mouth at bedtime.         Time coordinating discharge: 32 minutes  Signed:  Echo [place]  [facility] 10/25/2019, 9:13 AM

## 2019-10-25 NOTE — Progress Notes (Signed)
[address] [place], [place] [address] Phone [phone]. Fax [phone]  Date: 10/25/2019                  Patient Name:  [name]  MRN: [id number]  DOB: [birth year]  Age / Sex: 69 y.o., female          Subjective: Patient seen and evaluated at bedside. She completed dialysis treatment today. Potassium low therefore plans for patient to be dialyzed against a 4K bath.    Current medications: Current Facility-Administered Medications  Medication Dose Route Frequency Provider Last Rate Last Admin  . albuterol (PROVENTIL) (2.5 MG/3ML) 0.083% nebulizer solution 3 mL  3 mL Inhalation Q4H PRN Sreenath, Sudheer B, MD      . allopurinol (ZYLOPRIM) tablet 100 mg  100 mg Oral Daily Priscella Mann, Sudheer B, MD   100 mg at 10/25/19 0915  . amLODipine (NORVASC) tablet 10 mg  10 mg Oral QHS Ralene Muskrat B, MD   10 mg at 10/24/19 2200  . carvedilol (COREG) tablet 25 mg  25 mg Oral BID Ralene Muskrat B, MD   25 mg at 10/24/19 2200  . Chlorhexidine Gluconate Cloth 2 % PADS 6 each  6 each Topical Q0600 Murlean Iba, MD   6 each at 10/25/19 0541  . clopidogrel (PLAVIX) tablet 75 mg  75 mg Oral Daily Ralene Muskrat B, MD   75 mg at 10/25/19 0915  . dextrose 50 % solution 12.5 g  12.5 g Intravenous Once Sharion Settler, NP      . diphenhydrAMINE (BENADRYL) capsule 50 mg  50 mg Oral QHS Priscella Mann, Sudheer B, MD   50 mg at 10/24/19 2200  . epoetin alfa (EPOGEN) injection 4,000 Units  4,000 Units Intravenous Q T,Th,Sa-HD Murlean Iba, MD   4,000 Units at 10/25/19 1347  . feeding supplement (BOOST / RESOURCE BREEZE) liquid 1 Container  1 Container Oral 5 X Daily Sidney Ace, MD   1 Container at 10/25/19 [phone]  . feeding supplement (PRO-STAT SUGAR FREE 64) liquid 30 mL  30 mL Oral TID Ralene Muskrat B, MD   30 mL at 10/25/19 0815  . gabapentin (NEURONTIN) capsule 300 mg  300 mg Oral QHS Sreenath, Sudheer B, MD   300 mg at 10/24/19 2200  . heparin injection 5,000 Units   5,000 Units Subcutaneous Q8H Judd Gaudier V, MD   5,000 Units at 10/24/19 1322  . hydrALAZINE (APRESOLINE) tablet 25 mg  25 mg Oral Q8H Sreenath, Sudheer B, MD   25 mg at 10/25/19 0557  . HYDROmorphone (DILAUDID) injection 1 mg  1 mg Intravenous Q4H PRN Jennye Boroughs, MD   1 mg at 10/25/19 1531  . HYDROmorphone (DILAUDID) tablet 2 mg  2 mg Oral Q4H PRN Sreenath, Sudheer B, MD      . insulin aspart (novoLOG) injection 0-9 Units  0-9 Units Subcutaneous Q4H Athena Masse, MD   2 Units at 10/24/19 1714  . lipase/protease/amylase (CREON) capsule 24,000 Units  24,000 Units Oral TID AC Ralene Muskrat B, MD   24,000 Units at 10/25/19 0815  . loperamide (IMODIUM) capsule 2 mg  2 mg Oral PRN Ralene Muskrat B, MD   2 mg at 10/19/19 1805  . multivitamin (RENA-VIT) tablet 1 tablet  1 tablet Oral QHS Ralene Muskrat B, MD   1 tablet at 10/23/19 2116  . nicotine (NICODERM CQ - dosed in mg/24 hours) patch 21 mg  21 mg Transdermal Daily Sreenath, Sudheer B, MD   21 mg  at 10/25/19 1002  . ondansetron (ZOFRAN) tablet 4 mg  4 mg Oral Q6H PRN Athena Masse, MD       Or  . ondansetron [facility]) injection 4 mg  4 mg Intravenous Q6H PRN Athena Masse, MD   4 mg at 10/24/19 2147  . pantoprazole (PROTONIX) EC tablet 40 mg  40 mg Oral BID Jennye Boroughs, MD   40 mg at 10/25/19 0915  . promethazine (PHENERGAN) injection 6.25 mg  6.25 mg Intravenous Q6H PRN Athena Masse, MD   6.25 mg at 10/23/19 0937  . traZODone (DESYREL) tablet 150 mg  150 mg Oral QHS Sreenath, Sudheer B, MD   150 mg at 10/24/19 2200     Vital Signs: Blood pressure 120/70, pulse 69, temperature 98.4 F (36.9 C), temperature source Oral, resp. rate 17, height 5\' 5"  (1.651 m), weight 43.2 kg, SpO2 98 %.   Intake/Output Summary (Last 24 hours) at 10/25/2019 1611 Last data filed at 10/25/2019 1450 Gross per 24 hour  Intake 240 ml  Output 1100 ml  Net -860 ml    Weight trends: Filed Weights   10/17/19 0024 10/23/19 1040 10/23/19 1421   Weight: 44 kg 44.7 kg 43.2 kg    Physical Exam: General:  Thin, frail, elderly female, laying in the bed  HEENT  anicteric, moist oral mucous membranes  Neck:  Supple, no mass  Lungs:  normal breathing effort, clear to auscultation  Heart::  No rub or gallop, irregular rhythm  Abdomen:  Soft, mild midepigastric tenderness  Extremities:  No peripheral edema  Neurologic:  Alert, oriented, able to answer questions  Skin:  Warm, dry  Access:  Left arm AV fistula    Lab results: Basic Metabolic Panel: Recent Labs  Lab 10/19/19 0709 10/19/19 0709 10/19/19 0714 10/20/19 0441 10/21/19 0808 10/25/19 1203 10/25/19 1445  NA 138  --   --   --  137  --   --   K 3.8   < >  --   --  3.7 2.3* 3.3*  CL 102  --   --   --  101  --   --   CO2 29  --   --   --  29  --   --   GLUCOSE 95  --   --   --  86  --   --   BUN 13  --   --   --  11  --   --   CREATININE 1.57*  --   --   --  1.46*  --   --   CALCIUM 7.8*  --   --   --  7.8*  --   --   MG  --   --  1.6*  --  1.4*  --   --   PHOS  --   --   --  4.2  --  2.1*  --    < > = values in this interval not displayed.    Liver Function Tests: No results for input(s): AST, ALT, ALKPHOS, BILITOT, PROT, ALBUMIN in the last 168 hours. No results for input(s): LIPASE, AMYLASE in the last 168 hours. No results for input(s): AMMONIA in the last 168 hours.  CBC: Recent Labs  Lab 10/19/19 0714 10/19/19 0714 10/21/19 0808 10/23/19 0425  WBC 5.0   < > 6.1 8.8  NEUTROABS 3.8  --  4.0  --   HGB 8.9*   < > 8.9* 8.1*  HCT 26.9*   < > 27.9* 24.4*  MCV 103.9*   < > 106.1* 103.0*  PLT 157   < > 167 193   < > = values in this interval not displayed.    Cardiac Enzymes: No results for input(s): CKTOTAL, TROPONINI in the last 168 hours.  BNP: Invalid input(s): POCBNP  CBG: Recent Labs  Lab 10/24/19 1642 10/24/19 2105 10/25/19 0010 10/25/19 0447 10/25/19 0801  GLUCAP 159* 87 118* 95 93    Microbiology: Recent Results (from the past  720 hour(s))  SARS CORONAVIRUS 2 (TAT 6-24 HRS) Nasopharyngeal Nasopharyngeal Swab     Status: None   Collection Time: 10/16/19  9:18 PM   Specimen: Nasopharyngeal Swab  Result Value Ref Range Status   SARS Coronavirus 2 NEGATIVE NEGATIVE Final    Comment: (NOTE) SARS-CoV-2 target nucleic acids are NOT DETECTED. The SARS-CoV-2 RNA is generally detectable in upper and lower respiratory specimens during the acute phase of infection. Negative results do not preclude SARS-CoV-2 infection, do not rule out co-infections with other pathogens, and should not be used as the sole basis for treatment or other patient management decisions. Negative results must be combined with clinical observations, patient history, and epidemiological information. The expected result is Negative. Fact Sheet for Patients: SugarRoll.be Fact Sheet for Healthcare Providers: https://www.woods-mathews.com/ This test is not yet approved or cleared by the [place] FDA and  has been authorized for detection and/or diagnosis of SARS-CoV-2 by FDA under an Emergency Use Authorization (EUA). This EUA will remain  in effect (meaning this test can be used) for the duration of the COVID-19 declaration under Section 56 4(b)(1) of the Act, 21 U.S.C. section 360bbb-3(b)(1), unless the authorization is terminated or revoked sooner. Performed at [facility], [place] [address], [place], [place] [address]   GI pathogen panel by PCR, stool     Status: None   Collection Time: 10/18/19  7:48 AM   Specimen: Stool  Result Value Ref Range Status   Plesiomonas shigelloides NOT DETECTED NOT DETECTED Final   Yersinia enterocolitica NOT DETECTED NOT DETECTED Final   Vibrio NOT DETECTED NOT DETECTED Final   Enteropathogenic E coli NOT DETECTED NOT DETECTED Final   E coli (ETEC) LT/ST NOT DETECTED NOT DETECTED Final   E coli 2409 by PCR Not applicable NOT DETECTED Final   Cryptosporidium  by PCR NOT DETECTED NOT DETECTED Final   Entamoeba histolytica NOT DETECTED NOT DETECTED Final   Adenovirus F 40/41 NOT DETECTED NOT DETECTED Final   Norovirus GI/GII NOT DETECTED NOT DETECTED Final   Sapovirus NOT DETECTED NOT DETECTED Final    Comment: (NOTE) Performed At: [facility] [place], [place] [id number] Rush Farmer MD QA:[id number]    Vibrio cholerae NOT DETECTED NOT DETECTED Final   Campylobacter by PCR NOT DETECTED NOT DETECTED Final   Salmonella by PCR NOT DETECTED NOT DETECTED Final   E coli (STEC) NOT DETECTED NOT DETECTED Final   Enteroaggregative E coli NOT DETECTED NOT DETECTED Final   Shigella by PCR NOT DETECTED NOT DETECTED Final   Cyclospora cayetanensis NOT DETECTED NOT DETECTED Final   Astrovirus NOT DETECTED NOT DETECTED Final   G lamblia by PCR NOT DETECTED NOT DETECTED Final   Rotavirus A by PCR NOT DETECTED NOT DETECTED Final  C difficile quick scan w PCR reflex     Status: None   Collection Time: 10/18/19  7:48 AM   Specimen: STOOL  Result Value Ref Range Status  C Diff antigen NEGATIVE NEGATIVE Final   C Diff toxin NEGATIVE NEGATIVE Final   C Diff interpretation No C. difficile detected.  Final    Comment: Performed at [facility], [address] [place], [place] [address]     Coagulation Studies: No results for input(s): LABPROT, INR in the last 72 hours.  Urinalysis: No results for input(s): COLORURINE, LABSPEC, PHURINE, GLUCOSEU, HGBUR, BILIRUBINUR, KETONESUR, PROTEINUR, UROBILINOGEN, NITRITE, LEUKOCYTESUR in the last 72 hours.  Invalid input(s): APPERANCEUR    Imaging: No results found.   Assessment & Plan: Pt is a 69 y.o. Caucasian   female with  medical problems of ESRD, on hemodialysis, CAD/CABG 03/2011, COPD, atrial fibrillation, hypertension, COPD, chronic pancreatitis and chronic abdominal pain requiring recurrent hospitalizations , h/o AAA, cholecystectomy, severe peripheral arterial disease  including bilateral renal artery stent, subclavian artery stenosis, left, left CEA was admitted on 10/16/2019 with abdominal pain, diarrhea Acute pancreatitis [K85.90] Acute on chronic pancreatitis ([place]) [K85.90, K86.1]   [facility]/TTS/left arm AV fistula/[facility] [place]  #End-stage renal disease Patient completed dialysis treatment today.  She request use of 16-gauge needles for treatments.     #Acute pancreatitis Patient continues to have mild epigastric pain that is treated with Dilaudid.  Management as per hospitalist.  #Anemia of chronic kidney disease Lab Results  Component Value Date   HGB 8.1 (L) 10/23/2019  Continue Epogen 4000 IV with dialysis treatments.  #Hypokalemia. Patient dialyzed against a 4K bath.  Posttreatment potassium 3.3.    LOS: 9 Taeden Geller 2/18/20214:11 PM    Note: This note was prepared with Dragon dictation. Any transcription errors are unintentional

## 2019-10-25 NOTE — Progress Notes (Signed)
Notified Dr. Ilona Sorrel of critical potassium

## 2019-10-25 NOTE — Progress Notes (Signed)
Patient discharged via w/c with personal belongings to personal vehicle , D[facility] . IV removed from RFA

## 2019-10-25 NOTE — Progress Notes (Signed)
Calorie Count Note RD working remotely.  48 hour calorie count ordered.  Spoke with patient over the phone to determine her intake in the past 48 hours.  Diet: Soft Supplements: Boost Breeze po 5 times daily (250 kcal and 9 grams of protein), Pro-Stat 30 mL BID (100 kcal and 15 grams of protein)   Day 1: Lunch 2/16: N/A - pt missed due to HD Dinner 2/16: 152 kcal, 3 grams of protein Breakfast 2/17: 64 kcal, 1 gram of protein Supplements: 1200 kcal, 66 grams of protein (4 bottles Boost Breeze, 2 packets Pro-Stat)  Total intake Day 1: 1416 kcal (101% of minimum estimated needs)  70 grams of protein (100% of minimum estimated needs)  Day 2: Lunch 2/17: 68 kcal, 1 gram of protein Dinner 2/17: 50 kcal, 5 grams of protein Breakfast 2/18: 79 kcal, 4 grams of protein Supplements: 1200 kcal, 66 grams of protein (4 bottles Boost Breeze, 2 packets Pro-Stat)  Total intake Day 2: 1397 kcal (99.8% of minimum estimated needs)  76 grams of protein (109% of minimum estimated needs)  Estimated Nutritional Needs:  Kcal:  1400-1600kcal/day Protein:  70-80g/day Fluid:  UOP +1L  Nutrition Dx: Severe Malnutrition related to chronic illness(ESRD on HD, COPD) as evidenced by percent weight loss, severe muscle depletion, severe fat depletion.  Goal: Patient will meet greater than or equal to 90% of their needs  Intervention:  -Patient is able to meet her calorie and protein needs only due to intake of Boost Breeze and Pro-Stat. This is likely not sustainable at home due to expense. It would be ideal if patient could increase intake at meals and only rely on 2-3 oral nutrition supplements daily. Discussed with patient over the phone and she feels she will be able to eat better at home. Provided her with RD contact information and encouraged her to call with any questions. -Provide Boost Breeze po QID, each supplement provides 250 kcal and 9 grams of protein. -Continue Pro-Stat 30 mL po BID, each  supplement provides 100 kcal and 15 grams of protein. -Continue Rena-vite QHS and vitamin C 500 mg BID.   Jacklynn Barnacle, MS, RD, LDN Pager number available on Amion

## 2019-10-25 NOTE — Progress Notes (Signed)
[place] started [id number]

## 2019-10-26 ENCOUNTER — Telehealth: Payer: Self-pay | Admitting: Gastroenterology

## 2019-10-26 NOTE — Telephone Encounter (Signed)
Hey Dr. Rush Landmark- This patient [name] is a previous patient of yours last seen 09/2018 seen for acute pancreatitis. She states that she had to switch GI providers due to her insurance. She transferred care to [facility]. She now has a new IT consultant and is requesting to transfer her care back to you. The records are in [place] for you to review. Please advise on if you will accept her back as a patient. Thank you!

## 2019-10-27 NOTE — Telephone Encounter (Signed)
Will review by middle of next week. Will update after review. GM

## 2019-10-31 ENCOUNTER — Encounter: Payer: Self-pay | Admitting: *Deleted

## 2019-10-31 ENCOUNTER — Ambulatory Visit
Admission: RE | Admit: 2019-10-31 | Discharge: 2019-10-31 | Disposition: A | Discharge status: Home / Self Care | Payer: Medicare Other | Source: Ambulatory Visit | Attending: Radiology | Admitting: Radiology

## 2019-10-31 ENCOUNTER — Other Ambulatory Visit: Payer: Self-pay

## 2019-10-31 ENCOUNTER — Other Ambulatory Visit (HOSPITAL_COMMUNITY): Payer: Self-pay | Admitting: Radiology

## 2019-10-31 DIAGNOSIS — G8929 Other chronic pain: Secondary | ICD-10-CM

## 2019-10-31 HISTORY — PX: IR RADIOLOGIST EVAL & MGMT: IMG5224

## 2019-10-31 NOTE — Progress Notes (Signed)
Chief Complaint: Patient was consulted remotely today (TeleHealth) for follow-up after celiac plexus block.  History of Present Illness: [name] is a 69 y.o. female with a history of chronic pancreatitis who is status post CT-guided neurolysis of the celiac plexus on 09/24/2019 to treat intractable chronic abdominal pain.  Following the procedure, she noticed a clear improvement in her chronic right-sided abdominal pain.  She did have a flareup recently of acute on chronic pancreatitis and was admitted at [facility] from 10/16/2019 until 10/25/2019. Since discharge, she has had predominantly left-sided and epigastric pain for which she is taking oral Dilaudid.  She is having to double her usual Dilaudid dose and is having moderate chronic pain currently.  Past Medical History:  Diagnosis Date  . AAA (abdominal aortic aneurysm) ([place]) 5/09   3.7x3.3 by u/s 2009  . Abnormal EKG    deep TW inversions chronic  . Anemia   . CAD (coronary artery disease)    a. CABG 03/2011 LIMA to LAD, SVG to OM, SVG to PDA. b. cath 06/25/2015 DES to SVG to rPDA, patent LIMA to LAD, patent SVG to OM  . carotid stenosis 5/09   S/p L CEA ;  60-79% bilat ICA by preCABG dopplers 7/12  . CHF (congestive heart failure) ([place])   . Chronic back pain    "all over" (06/24/2015)  . Chronic bronchitis ([place])    "get it pretty much q yr" (06/24/2015)  . CKD (chronic kidney disease)   . Complication of anesthesia 1966   "problem w/ether"  . COPD (chronic obstructive pulmonary disease) ([place])   . Depression   . Dysrhythmia   . GERD (gastroesophageal reflux disease)    With hiatal hernia  . GIB (gastrointestinal bleeding) 9/11   S/p EGD with cautery at [facility]  . Heart murmur   . History of blood transfusion "a few times"   "all related to anemia" (06/24/2015)  . History of hiatal hernia   . Hyperlipidemia   . Hypertension   . Mitral regurgitation    3+ MR by intraoperative TEE;  s/p MV repair with Dr. Roxy Manns  7/12  . Myocardial infarction [facility]) 2012 "several"  . On home oxygen therapy    "2 liters at night; negative for sleep apnea"  . PAD (peripheral artery disease) ([facility])    Severe; s/p bilateral renal artery stents, moderate in-stent restenosis  . Pancreatitis 05/2018  . Paroxysmal atrial fibrillation ([facility])    coumadin;  echo 9/07: EF 60%, mild LVH;  s/p Cox Maze 7/12 with LAA clipping  . Subclavian artery stenosis, left (H[facility])    stented by Dr. Trula Slade on 10/18 to help flow of her LIMA to LAD  . Type II diabetes mellitus ([place])     Past Surgical History:  Procedure Laterality Date  . A/V FISTULAGRAM Left 03/21/2018   Procedure: A/V FISTULAGRAM;  Surgeon: Serafina Mitchell, MD;  Location: [place] [facility];  Service: Cardiovascular;  Laterality: Left;  . ABDOMINAL AORTIC ENDOVASCULAR STENT GRAFT N/A 01/17/2018   Procedure: ABDOMINAL AORTIC ENDOVASCULAR STENT GRAFT WITH CO2;  Surgeon: Serafina Mitchell, MD;  Location: [place];  Service: Vascular;  Laterality: N/A;  . ABDOMINAL HYSTERECTOMY  1990's  . APPENDECTOMY  Aug. 11, 2016   Ruptured  . AV FISTULA PLACEMENT Left 11/03/2017   Procedure: ARTERIOVENOUS (AV) FISTULA CREATION LEFT ARM;  Surgeon: Serafina Mitchell, MD;  Location: [place];  Service: Vascular;  Laterality: Left;  . BOWEL RESECTION  2013  .  CARDIAC CATHETERIZATION N/A 06/24/2015   Procedure: Left Heart Cath and Cors/Grafts Angiography;  Surgeon: Leonie Man, MD;  Location: [place] [facility];  Service: Cardiovascular;  Laterality: N/A;  . CARDIAC CATHETERIZATION N/A 06/25/2015   Procedure: Coronary Stent Intervention;  Surgeon: Sherren Mocha, MD;  Location: [place] [facility];  Service: Cardiovascular;  Laterality: N/A;  . CAROTID ENDARTERECTOMY Left   . CATARACT EXTRACTION Right 03/27/2018  . CHOLECYSTECTOMY N/A 04/19/2018   Procedure: LAPAROSCOPIC CHOLECYSTECTOMY WITH INTRAOPERATIVE CHOLANGIOGRAM;  Surgeon: Rolm Bookbinder, MD;  Location: [place];  Service: General;  Laterality:  N/A;  . CORONARY ANGIOPLASTY    . CORONARY ARTERY BYPASS GRAFT  03/05/2011   CABG X3 (LIMA to LAD, SVG to OM, SVG to PDA, EVH via left thigh  . CORONARY STENT PLACEMENT    . ESOPHAGOGASTRODUODENOSCOPY N/A 04/12/2018   Procedure: ESOPHAGOGASTRODUODENOSCOPY (EGD);  Surgeon: [name], MD;  Location: [facility] ENDOSCOPY;  Service: Endoscopy;  Laterality: N/A;  . ESOPHAGOGASTRODUODENOSCOPY N/A 06/16/2018   Procedure: ESOPHAGOGASTRODUODENOSCOPY (EGD);  Surgeon: Thornton Park, MD;  Location: [place];  Service: Gastroenterology;  Laterality: N/A;  . IR RADIOLOGIST EVAL & MGMT  09/13/2019  . LACERATION REPAIR Right 1990's   WRIST  . MAZE  03/05/2011   complete biatrial lesion set with clipping of LA appendage  . MITRAL VALVE REPAIR  03/05/2011   16mm Memo 3D ring annuloplasty for ischemic MR  . PERIPHERAL VASCULAR BALLOON ANGIOPLASTY Left 03/21/2018   Procedure: PERIPHERAL VASCULAR BALLOON ANGIOPLASTY;  Surgeon: Serafina Mitchell, MD;  Location: [place] [facility];  Service: Cardiovascular;  Laterality: Left;  . PERIPHERAL VASCULAR CATHETERIZATION N/A 06/24/2015   Procedure: Aortic Arch Angiography;  Surgeon: Serafina Mitchell, MD;  Location: [place] [facility];  Service: Cardiovascular;  Laterality: N/A;  . PERIPHERAL VASCULAR CATHETERIZATION  06/24/2015   Procedure: Peripheral Vascular Intervention;  Surgeon: Serafina Mitchell, MD;  Location: [place] [facility];  Service: Cardiovascular;;  . PERIPHERAL VASCULAR CATHETERIZATION N/A 06/24/2015   Procedure: Abdominal Aortogram;  Surgeon: Serafina Mitchell, MD;  Location: [place] [facility];  Service: Cardiovascular;  Laterality: N/A;  . RENAL ANGIOGRAM Bilateral 09/11/2013   Procedure: RENAL ANGIOGRAM;  Surgeon: Serafina Mitchell, MD;  Location: [facility] CATH LAB;  Service: Cardiovascular;  Laterality: Bilateral;  . RIGHT FEMORAL-POPLITEAL BYPASS      Allergies: Isosorbide, Codeine, Contrast media [iodinated diagnostic agents], Ioxaglate, Isosorbide nitrate,  Metrizamide, Oxycodone-acetaminophen, and Percocet [oxycodone-acetaminophen]  Medications: Prior to Admission medications   Medication Sig Start Date End Date Taking? Authorizing Provider  Albuterol Sulfate (PROAIR RESPICLICK) 485 (90 Base) MCG/ACT AEPB Inhale 1 puff into the lungs every 4 (four) hours as needed (shortness of breath/wheezing).    [provider]  allopurinol (ZYLOPRIM) 100 MG tablet Take 100 mg by mouth daily. 02/02/17   [provider]  amLODipine (NORVASC) 5 MG tablet Take 1 tablet (5 mg total) by mouth daily. Patient taking differently: Take 10 mg by mouth daily.  08/23/18   Mikhail, Velta Addison, DO  atorvastatin (LIPITOR) 40 MG tablet Take 40 mg by mouth daily.  12/07/17   [provider]  B Complex-C-Zn-Folic Acid (DIALYVITE 462-VOJJ 15) 0.8 MG TABS Take 1 tablet by mouth daily. 08/09/19   [provider]  carvedilol (COREG) 25 MG tablet Take 25 mg by mouth 2 (two) times daily. 04/16/19   [provider]  clopidogrel (PLAVIX) 75 MG tablet Take 75 mg by mouth daily. 10/02/19   [provider]  CREON 12000 units CPEP capsule  Take 2 capsules (24,000 Units total) by mouth 3 (three) times daily before meals. 05/18/19   Regalado, Belkys A, MD  diphenhydrAMINE (BENADRYL) 25 MG tablet Take 50 mg by mouth at bedtime.    [provider]  gabapentin (NEURONTIN) 300 MG capsule Take 1 capsule (300 mg total) by mouth at bedtime. 08/22/18   Mikhail, Velta Addison, DO  hydrALAZINE (APRESOLINE) 25 MG tablet Take 1 tablet (25 mg total) by mouth every 8 (eight) hours. 06/26/18   Thurnell Lose, MD  HYDROmorphone (DILAUDID) 2 MG tablet Take 2-4 mg by mouth every 4 (four) hours.     [provider]  OXYGEN Inhale 2 L into the lungs at bedtime.     [provider]  pantoprazole (PROTONIX) 40 MG tablet Take 1 tablet (40 mg total) by mouth daily. 08/03/18   Patrecia Pour, MD  promethazine (PHENERGAN) 12.5 MG tablet Take 12.5 mg by  mouth every 4 (four) hours as needed for nausea or vomiting.    [provider]  traZODone (DESYREL) 100 MG tablet Take 150 mg by mouth at bedtime.  04/19/19   [provider]     Family History  Problem Relation Age of Onset  . Emphysema Mother   . Heart disease Mother        before age 70  . Hypertension Mother   . Hyperlipidemia Mother   . Heart attack Mother   . AAA (abdominal aortic aneurysm) Mother        rupture  . Heart attack Father 60  . Emphysema Father   . Heart disease Father        before age 70  . Hyperlipidemia Father   . Hypertension Father   . Peripheral vascular disease Father   . Diabetes Brother   . Heart disease Brother        before age 42  . Hyperlipidemia Brother   . Hypertension Brother   . Heart attack Brother        CABG  . Heart disease Other        Vascular disease in grandparents, uncles and dad  . Colon cancer Neg Hx   . Esophageal cancer Neg Hx   . Inflammatory bowel disease Neg Hx   . Liver disease Neg Hx   . Pancreatic cancer Neg Hx   . Rectal cancer Neg Hx   . Stomach cancer Neg Hx     Social History   Socioeconomic History  . Marital status: Widowed    Spouse name: Not on file  . Number of children: Not on file  . Years of education: Not on file  . Highest education level: Not on file  Occupational History  . Occupation: Scientist, water quality in past    Employer: DISABLED  Tobacco Use  . Smoking status: Current Every Day Smoker    Packs/day: 0.75    Years: 50.00    Pack years: 37.50    Types: Cigarettes  . Smokeless tobacco: Never Used  . Tobacco comment: 3/4 pk per day  Substance and Sexual Activity  . Alcohol use: Yes    Alcohol/week: 0.0 standard drinks  . Drug use: Yes    Types: Marijuana  . Sexual activity: Not Currently    Birth control/protection: None  Other Topics Concern  . Not on file  Social History Narrative   Widowed in 2009.   Social Determinants of Health   Financial Resource Strain:   .  Difficulty of Paying Living Expenses: Not on file  Food Insecurity:   . Worried About Charity fundraiser in the Last Year: Not on file  . Ran Out of Food in the Last Year: Not on file  Transportation Needs:   . Lack of Transportation (Medical): Not on file  . Lack of Transportation (Non-Medical): Not on file  Physical Activity:   . Days of Exercise per Week: Not on file  . Minutes of Exercise per Session: Not on file  Stress:   . Feeling of Stress : Not on file  Social Connections:   . Frequency of Communication with Friends and Family: Not on file  . Frequency of Social Gatherings with Friends and Family: Not on file  . Attends Religious Services: Not on file  . Active Member of Clubs or Organizations: Not on file  . Attends Archivist Meetings: Not on file  . Marital Status: Not on file     Review of Systems  Constitutional: Positive for appetite change.  Respiratory: Negative.   Cardiovascular: Negative.   Gastrointestinal: Positive for abdominal pain and nausea.  Genitourinary: Negative.   Musculoskeletal: Negative.   Neurological: Negative.     Review of Systems: A 12 point ROS discussed and pertinent positives are indicated in the HPI above.  All other systems are negative.  Physical Exam No direct physical exam was performed (except for noted visual exam findings with Video Visits).   Vital Signs: There were no vitals taken for this visit.  Imaging: CT ABDOMEN PELVIS WO CONTRAST  Result Date: 10/21/2019 CLINICAL DATA:  Abdominal pain with nausea, vomiting and diarrhea for 4 days. History of chronic pancreatitis. EXAM: CT ABDOMEN AND PELVIS WITHOUT CONTRAST TECHNIQUE: Multidetector CT imaging of the abdomen and pelvis was performed following the standard protocol without IV contrast. COMPARISON:  CT abdomen dated 10/16/2019. FINDINGS: Lower chest: New small RIGHT pleural effusion. Hepatobiliary: No focal mass or lesion is seen within the liver. Intrahepatic  bile duct dilatation and/or periportal edema. Status post cholecystectomy. Stable appearance of the mildly distended common bile duct. No CBD stone appreciated. Pancreas: Coarse calcifications again noted within the prominent pancreatic head. Configuration the pancreatic head appears stable in the short-term interval. There is stable dilatation of the pancreatic duct within the pancreatic head. Walls of the adjacent duodenum again appear thickened, presumably reactive in nature. Spleen: Normal in size without focal abnormality. Adrenals/Urinary Tract: Adrenal glands appear normal. Bilateral renal stones. No hydronephrosis. No ureteral or bladder calculi are identified. Bladder is unremarkable, partially decompressed Stomach/Bowel: Surgical anastomosis at the rectosigmoid colon level, without obstruction, inflammation or other surgical complicating feature. No dilated large or small bowel loops. Presumed appendectomy. Stomach is unremarkable, partially decompressed. Vascular/Lymphatic: Aorta bi-iliac stents in place. 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 No periaortic hemorrhage or edema appreciated. Bilateral renal artery stents are also in place. Heavy atherosclerotic changes are seen along the remainder of the abdominal aorta and aortic branch vessels. No enlarged lymph nodes seen in the abdomen or pelvis. Reproductive: Presumed hysterectomy. No adnexal mass or free fluid. Other: No free fluid or abscess collection is seen. No free intraperitoneal air. Musculoskeletal: No acute or suspicious osseous finding. Degenerative spondylosis of the slightly scoliotic thoracolumbar spine, mild to moderate in degree. IMPRESSION: 1. New small RIGHT pleural effusion. 2. Stable appearance of the enlarged pancreatic head, with associated coarse calcifications and pancreatic duct dilatation, and with small amount of fluid stranding again seen within the peripancreatic soft tissues corresponding to recent  episode of acute pancreatitis (  acute on chronic pancreatitis). 3. Walls of the adjacent duodenum again appear thickened, likely reactive in nature. 4. Aorta bi-iliac stents in place. 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 No periaortic hemorrhage or edema appreciated. 5. Bilateral nephrolithiasis. No hydronephrosis. No ureteral or bladder calculi. 6. No bowel obstruction. No secondary signs of bowel wall ischemia. No abscess collection or free intraperitoneal air. Aortic Atherosclerosis (ICD10-I70.0). Electronically Signed   By: Franki Cabot M.D.   On: 10/21/2019 13:46   CT ABDOMEN PELVIS WO CONTRAST  Result Date: 10/16/2019 CLINICAL DATA:  Abdominal pain EXAM: CT ABDOMEN AND PELVIS WITHOUT CONTRAST TECHNIQUE: Multidetector CT imaging of the abdomen and pelvis was performed following the standard protocol without IV contrast. COMPARISON:  July 27, 2019 FINDINGS: Lower chest: The visualized heart size within normal limits. No pericardial fluid/thickening. No hiatal hernia. Streaky atelectasis seen at the posterior right lung base. Hepatobiliary: Although limited due to the lack of intravenous contrast, normal in appearance without gross focal abnormality. The patient is status post cholecystectomy. There is stable mildly dilated common bile duct. No common bile duct stones however are seen. Pancreas: There is fat stranding changes and enlargement seen of the pancreatic head with diffuse coarse calcifications seen within the pancreatic head and proximal body. There is also resultant surrounding inflammatory changes and wall thickening seen within the first and second portion of the duodenum. No loculated fluid collection is seen. No pneumoperitoneum. Spleen: Normal in size. Although limited due to the lack of intravenous contrast, normal in appearance. Adrenals/Urinary Tract: Both adrenal glands appear normal. Multiple bilateral calcifications are seen within the renal hilum,  likely vascular and possible punctate renal calculi in the lower poles. No hydronephrosis. No ureteral or bladder calculi are noted. Stomach/Bowel: The stomach Abner major of the small bowel are normal appearance. There is been a prior surgical anastomosis within the sigmoid rectal junction. Scattered colonic diverticula are noted without diverticulitis. Vascular/Lymphatic: There are no enlarged abdominal or pelvic lymph nodes. The patient is status post aorta bi-iliac stent graft. There is unchanged native aneurysm sac measuring 3.9 cm. There is also a bilateral renal arterial stents at the ostia. Dense vascular calcifications are seen throughout. Reproductive: The patient is status post hysterectomy. No adnexal masses or collections seen. Other: No evidence of abdominal wall mass or hernia. Musculoskeletal: No acute or significant osseous findings. 6 IMPRESSION: 1. Findings consistent with acute on chronic pancreatitis involving pancreatic head and proximal body. There is adjacent resultant duodenitis. No pneumoperitoneum or loculated fluid collections seen. 2. Aorta bi-iliac stent graft with unchanged native aneurysm sac size measuring 3.9 cm. 3. Diverticulosis without diverticulitis. Electronically Signed   By: Prudencio Pair M.D.   On: 10/16/2019 19:21    Labs:  CBC: Recent Labs    10/17/19 0250 10/19/19 0714 10/21/19 0808 10/23/19 0425  WBC 11.7* 5.0 6.1 8.8  HGB 9.8* 8.9* 8.9* 8.1*  HCT 29.6* 26.9* 27.9* 24.4*  PLT 198 157 167 193    COAGS: Recent Labs    09/24/19 1025  INR 1.0    BMP: Recent Labs    10/16/19 1652 10/16/19 1652 10/17/19 0250 10/17/19 0250 10/19/19 0709 10/21/19 0808 10/25/19 1203 10/25/19 1445  NA 134*  --  136  --  138 137  --   --   K 4.5   < > 4.3   < > 3.8 3.7 2.3* 3.3*  CL 96*  --  100  --  102 101  --   --  CO2 24  --  26  --  29 29  --   --   GLUCOSE 127*  --  90  --  95 86  --   --   BUN 26*  --  33*  --  13 11  --   --   CALCIUM 8.1*  --  7.9*   --  7.8* 7.8*  --   --   CREATININE 3.75*  --  3.61*  --  1.57* 1.46*  --   --   GFRNONAA 12*  --  12*  --  34* 37*  --   --   GFRAA 14*  --  14*  --  39* 43*  --   --    < > = values in this interval not displayed.    LIVER FUNCTION TESTS: Recent Labs    07/26/19 1420 08/23/19 1238 10/16/19 1652 10/17/19 0250  BILITOT 0.8 0.9 0.9 0.9  AST 18 18 20 18   ALT 11 9 15 14   ALKPHOS 80 112 152* 131*  PROT 5.6* 6.0* 5.2* 5.2*  ALBUMIN 2.7* 2.8* 2.4* 2.4*    Assessment and Plan:  I spoke with Ms. Stanaland by phone.  She did seem to have some relief in her chronic right-sided abdominal pain radiating into her back after the celiac block on 09/24/2019 which consisted of an injection of 7 mL 0.5% Sensorcaine, 80 mg Depo-Medrol and 20 mL absolute alcohol.  She is now having significant predominantly left-sided and epigastric pain after recent flareup of acute on chronic pancreatitis requiring hospital admission.  I told Ms. Campusano that it would probably be too early to consider repeat celiac plexus injection given the recent hospitalization and probable component of acute inflammation currently present contributing to her pain.  It has been just over 1 month since her celiac block.  I told her that I would follow-up with her in 2 weeks to see how she is doing and what her pain level is at that time.  Given that she did have some clear improvement in level of chronic pain after the first celiac block, we could certainly consider a second injection if she is still having significant pain at that time.   Electronically Signed: Azzie Roup 10/31/2019, 12:54 PM     I spent a total of 10 Minutes in remote  clinical consultation, greater than 50% of which was counseling/coordinating care for celiac plexus ablation.    Visit type: Audio only (telephone). Audio (no video) only due to patient's lack of internet/smartphone capability. Alternative for in-person consultation at [facility] [place]  [address], [place], [place]. This visit type was conducted due to national recommendations for restrictions regarding the COVID-19 Pandemic (e.g. social distancing).  This format is felt to be most appropriate for this patient at this time.  All issues noted in this document were discussed and addressed.

## 2019-11-03 NOTE — Telephone Encounter (Signed)
If the patient's insurance is accepted by our group (please ensure), I am willing to see the patient in follow-up. If this is to occur, all of the patient's imaging studies (those done outside of the [facility] by [facility]) need to have formal CDs and imaging uploaded into our PACS system so that my radiology colleagues can review all of the imaging. Once this has been done, she can be scheduled for a clinic visit if she meets these criteria. Thanks. GM

## 2019-11-05 NOTE — Telephone Encounter (Signed)
Spoke with patient- she is going to sign a release with [facility] and have all the records sent over to [place]. She will follow up in a week or so to see if we have received them. When records are received I will let Rovonda know so she can request the CDS.

## 2019-11-14 ENCOUNTER — Other Ambulatory Visit: Payer: Self-pay

## 2019-11-14 ENCOUNTER — Ambulatory Visit
Admission: RE | Admit: 2019-11-14 | Discharge: 2019-11-14 | Disposition: A | Discharge status: Home / Self Care | Payer: Medicare Other | Source: Ambulatory Visit | Attending: Radiology | Admitting: Radiology

## 2019-11-14 ENCOUNTER — Encounter: Payer: Self-pay | Admitting: *Deleted

## 2019-11-14 HISTORY — PX: IR RADIOLOGIST EVAL & MGMT: IMG5224

## 2019-11-14 NOTE — Progress Notes (Signed)
Chief Complaint: Patient was consulted remotely today (TeleHealth) for follow up after celiac block.  History of Present Illness: [name] is a 69 y.o. female with a history of chronic pancreatitis who is status post CT-guided neurolysis of the celiac plexus on 09/24/2019 to treat intractable chronic abdominal pain.  The procedure did improve right-sided abdominal pain.  She had a flareup of acute on chronic pancreatitis requiring hospital admission and was having significant left-sided and epigastric pain at the time of follow-up on 10/31/2019 which was only 6 days after discharge from her last hospitalization.  Since our last conversation 2 weeks ago, pain has improved slightly but remains constant and ranges from as low as approximately a tolerable level of 4/10 up to 9/10 in grade.  She is still regularly taking Dilaudid.  Past Medical History:  Diagnosis Date  . AAA (abdominal aortic aneurysm) ([place]) 5/09   3.7x3.3 by u/s 2009  . Abnormal EKG    deep TW inversions chronic  . Anemia   . CAD (coronary artery disease)    a. CABG 03/2011 LIMA to LAD, SVG to OM, SVG to PDA. b. cath 06/25/2015 DES to SVG to rPDA, patent LIMA to LAD, patent SVG to OM  . carotid stenosis 5/09   S/p L CEA ;  60-79% bilat ICA by preCABG dopplers 7/12  . CHF (congestive heart failure) ([place])   . Chronic back pain    "all over" (06/24/2015)  . Chronic bronchitis ([place])    "get it pretty much q yr" (06/24/2015)  . CKD (chronic kidney disease)   . Complication of anesthesia 1966   "problem w/ether"  . COPD (chronic obstructive pulmonary disease) ([place])   . Depression   . Dysrhythmia   . GERD (gastroesophageal reflux disease)    With hiatal hernia  . GIB (gastrointestinal bleeding) 9/11   S/p EGD with cautery at [facility]  . Heart murmur   . History of blood transfusion "a few times"   "all related to anemia" (06/24/2015)  . History of hiatal hernia   . Hyperlipidemia   . Hypertension   . Mitral  regurgitation    3+ MR by intraoperative TEE;  s/p MV repair with Dr. Roxy Manns 7/12  . Myocardial infarction [facility]) 2012 "several"  . On home oxygen therapy    "2 liters at night; negative for sleep apnea"  . PAD (peripheral artery disease) ([facility])    Severe; s/p bilateral renal artery stents, moderate in-stent restenosis  . Pancreatitis 05/2018  . Paroxysmal atrial fibrillation ([facility])    coumadin;  echo 9/07: EF 60%, mild LVH;  s/p Cox Maze 7/12 with LAA clipping  . Subclavian artery stenosis, left ([facility])    stented by Dr. Trula Slade on 10/18 to help flow of her LIMA to LAD  . Type II diabetes mellitus ([place])     Past Surgical History:  Procedure Laterality Date  . A/V FISTULAGRAM Left 03/21/2018   Procedure: A/V FISTULAGRAM;  Surgeon: Serafina Mitchell, MD;  Location: [place] [facility];  Service: Cardiovascular;  Laterality: Left;  . ABDOMINAL AORTIC ENDOVASCULAR STENT GRAFT N/A 01/17/2018   Procedure: ABDOMINAL AORTIC ENDOVASCULAR STENT GRAFT WITH CO2;  Surgeon: Serafina Mitchell, MD;  Location: [place];  Service: Vascular;  Laterality: N/A;  . ABDOMINAL HYSTERECTOMY  1990's  . APPENDECTOMY  Aug. 11, 2016   Ruptured  . AV FISTULA PLACEMENT Left 11/03/2017   Procedure: ARTERIOVENOUS (AV) FISTULA CREATION LEFT ARM;  Surgeon: Serafina Mitchell, MD;  [facility];  Service: Vascular;  Laterality: Left;  . BOWEL RESECTION  2013  . CARDIAC CATHETERIZATION N/A 06/24/2015   Procedure: Left Heart Cath and Cors/Grafts Angiography;  Surgeon: Leonie Man, MD;  Location: [place] [facility];  Service: Cardiovascular;  Laterality: N/A;  . CARDIAC CATHETERIZATION N/A 06/25/2015   Procedure: Coronary Stent Intervention;  Surgeon: Sherren Mocha, MD;  Location: [place] [facility];  Service: Cardiovascular;  Laterality: N/A;  . CAROTID ENDARTERECTOMY Left   . CATARACT EXTRACTION Right 03/27/2018  . CHOLECYSTECTOMY N/A 04/19/2018   Procedure: LAPAROSCOPIC CHOLECYSTECTOMY WITH INTRAOPERATIVE CHOLANGIOGRAM;   Surgeon: Rolm Bookbinder, MD;  Location: [place];  Service: General;  Laterality: N/A;  . CORONARY ANGIOPLASTY    . CORONARY ARTERY BYPASS GRAFT  03/05/2011   CABG X3 (LIMA to LAD, SVG to OM, SVG to PDA, EVH via left thigh  . CORONARY STENT PLACEMENT    . ESOPHAGOGASTRODUODENOSCOPY N/A 04/12/2018   Procedure: ESOPHAGOGASTRODUODENOSCOPY (EGD);  Surgeon: [name], MD;  Location: [facility] ENDOSCOPY;  Service: Endoscopy;  Laterality: N/A;  . ESOPHAGOGASTRODUODENOSCOPY N/A 06/16/2018   Procedure: ESOPHAGOGASTRODUODENOSCOPY (EGD);  Surgeon: Thornton Park, MD;  Location: [place];  Service: Gastroenterology;  Laterality: N/A;  . IR RADIOLOGIST EVAL & MGMT  09/13/2019  . IR RADIOLOGIST EVAL & MGMT  10/31/2019  . LACERATION REPAIR Right 1990's   WRIST  . MAZE  03/05/2011   complete biatrial lesion set with clipping of LA appendage  . MITRAL VALVE REPAIR  03/05/2011   41mm Memo 3D ring annuloplasty for ischemic MR  . PERIPHERAL VASCULAR BALLOON ANGIOPLASTY Left 03/21/2018   Procedure: PERIPHERAL VASCULAR BALLOON ANGIOPLASTY;  Surgeon: Serafina Mitchell, MD;  Location: [place] [facility];  Service: Cardiovascular;  Laterality: Left;  . PERIPHERAL VASCULAR CATHETERIZATION N/A 06/24/2015   Procedure: Aortic Arch Angiography;  Surgeon: Serafina Mitchell, MD;  Location: [place] [facility];  Service: Cardiovascular;  Laterality: N/A;  . PERIPHERAL VASCULAR CATHETERIZATION  06/24/2015   Procedure: Peripheral Vascular Intervention;  Surgeon: Serafina Mitchell, MD;  Location: [place] [facility];  Service: Cardiovascular;;  . PERIPHERAL VASCULAR CATHETERIZATION N/A 06/24/2015   Procedure: Abdominal Aortogram;  Surgeon: Serafina Mitchell, MD;  Location: [place] [facility];  Service: Cardiovascular;  Laterality: N/A;  . RENAL ANGIOGRAM Bilateral 09/11/2013   Procedure: RENAL ANGIOGRAM;  Surgeon: Serafina Mitchell, MD;  Location: [facility] CATH LAB;  Service: Cardiovascular;  Laterality: Bilateral;  . RIGHT FEMORAL-POPLITEAL  BYPASS      Allergies: Isosorbide, Codeine, Contrast media [iodinated diagnostic agents], Ioxaglate, Isosorbide nitrate, Metrizamide, Oxycodone-acetaminophen, and Percocet [oxycodone-acetaminophen]  Medications: Prior to Admission medications   Medication Sig Start Date End Date Taking? Authorizing Provider  Albuterol Sulfate (PROAIR RESPICLICK) 742 (90 Base) MCG/ACT AEPB Inhale 1 puff into the lungs every 4 (four) hours as needed (shortness of breath/wheezing).    [provider]  allopurinol (ZYLOPRIM) 100 MG tablet Take 100 mg by mouth daily. 02/02/17   [provider]  amLODipine (NORVASC) 5 MG tablet Take 1 tablet (5 mg total) by mouth daily. Patient taking differently: Take 10 mg by mouth daily.  08/23/18   Mikhail, Velta Addison, DO  atorvastatin (LIPITOR) 40 MG tablet Take 40 mg by mouth daily.  12/07/17   [provider]  B Complex-C-Zn-Folic Acid (DIALYVITE 595-GLOV 15) 0.8 MG TABS Take 1 tablet by mouth daily. 08/09/19   [provider]  carvedilol (COREG) 25 MG tablet Take 25 mg by mouth 2 (two) times daily. 04/16/19   [provider]  clopidogrel (PLAVIX) 75 MG tablet Take 75 mg by mouth daily. 10/02/19   [provider]  CREON 12000 units CPEP capsule Take 2 capsules (24,000 Units total) by mouth 3 (three) times daily before meals. 05/18/19   Regalado, Belkys A, MD  diphenhydrAMINE (BENADRYL) 25 MG tablet Take 50 mg by mouth at bedtime.    [provider]  gabapentin (NEURONTIN) 300 MG capsule Take 1 capsule (300 mg total) by mouth at bedtime. 08/22/18   Mikhail, Velta Addison, DO  hydrALAZINE (APRESOLINE) 25 MG tablet Take 1 tablet (25 mg total) by mouth every 8 (eight) hours. 06/26/18   Thurnell Lose, MD  HYDROmorphone (DILAUDID) 2 MG tablet Take 2-4 mg by mouth every 4 (four) hours.     [provider]  OXYGEN Inhale 2 L into the lungs at bedtime.     [provider]  pantoprazole (PROTONIX) 40 MG tablet Take 1  tablet (40 mg total) by mouth daily. 08/03/18   Patrecia Pour, MD  promethazine (PHENERGAN) 12.5 MG tablet Take 12.5 mg by mouth every 4 (four) hours as needed for nausea or vomiting.    [provider]  traZODone (DESYREL) 100 MG tablet Take 150 mg by mouth at bedtime.  04/19/19   [provider]     Family History  Problem Relation Age of Onset  . Emphysema Mother   . Heart disease Mother        before age 30  . Hypertension Mother   . Hyperlipidemia Mother   . Heart attack Mother   . AAA (abdominal aortic aneurysm) Mother        rupture  . Heart attack Father 70  . Emphysema Father   . Heart disease Father        before age 35  . Hyperlipidemia Father   . Hypertension Father   . Peripheral vascular disease Father   . Diabetes Brother   . Heart disease Brother        before age 20  . Hyperlipidemia Brother   . Hypertension Brother   . Heart attack Brother        CABG  . Heart disease Other        Vascular disease in grandparents, uncles and dad  . Colon cancer Neg Hx   . Esophageal cancer Neg Hx   . Inflammatory bowel disease Neg Hx   . Liver disease Neg Hx   . Pancreatic cancer Neg Hx   . Rectal cancer Neg Hx   . Stomach cancer Neg Hx     Social History   Socioeconomic History  . Marital status: Widowed    Spouse name: Not on file  . Number of children: Not on file  . Years of education: Not on file  . Highest education level: Not on file  Occupational History  . Occupation: Scientist, water quality in past    Employer: DISABLED  Tobacco Use  . Smoking status: Current Every Day Smoker    Packs/day: 0.75    Years: 50.00    Pack years: 37.50    Types: Cigarettes  . Smokeless tobacco: Never Used  . Tobacco comment: 3/4 pk per day  Substance and Sexual Activity  . Alcohol use: Yes    Alcohol/week: 0.0 standard drinks  . Drug use: Yes    Types: Marijuana  . Sexual activity: Not Currently    Birth control/protection: None  Other Topics Concern  . Not on  file  Social History Narrative  Widowed in 2009.   Social Determinants of Health   Financial Resource Strain:   . Difficulty of Paying Living Expenses: Not on file  Food Insecurity:   . Worried About Charity fundraiser in the Last Year: Not on file  . Ran Out of Food in the Last Year: Not on file  Transportation Needs:   . Lack of Transportation (Medical): Not on file  . Lack of Transportation (Non-Medical): Not on file  Physical Activity:   . Days of Exercise per Week: Not on file  . Minutes of Exercise per Session: Not on file  Stress:   . Feeling of Stress : Not on file  Social Connections:   . Frequency of Communication with Friends and Family: Not on file  . Frequency of Social Gatherings with Friends and Family: Not on file  . Attends Religious Services: Not on file  . Active Member of Clubs or Organizations: Not on file  . Attends Archivist Meetings: Not on file  . Marital Status: Not on file     Review of Systems  Constitutional: Negative.   Cardiovascular: Negative.   Gastrointestinal: Positive for abdominal pain.  Genitourinary: Negative.   Musculoskeletal: Negative.   Neurological: Negative.     Review of Systems: A 12 point ROS discussed and pertinent positives are indicated in the HPI above.  All other systems are negative.  Physical Exam No direct physical exam was performed (except for noted visual exam findings with Video Visits).   Vital Signs: There were no vitals taken for this visit.  Imaging: CT ABDOMEN PELVIS WO CONTRAST  Result Date: 10/21/2019 CLINICAL DATA:  Abdominal pain with nausea, vomiting and diarrhea for 4 days. History of chronic pancreatitis. EXAM: CT ABDOMEN AND PELVIS WITHOUT CONTRAST TECHNIQUE: Multidetector CT imaging of the abdomen and pelvis was performed following the standard protocol without IV contrast. COMPARISON:  CT abdomen dated 10/16/2019. FINDINGS: Lower chest: New small RIGHT pleural effusion.  Hepatobiliary: No focal mass or lesion is seen within the liver. Intrahepatic bile duct dilatation and/or periportal edema. Status post cholecystectomy. Stable appearance of the mildly distended common bile duct. No CBD stone appreciated. Pancreas: Coarse calcifications again noted within the prominent pancreatic head. Configuration the pancreatic head appears stable in the short-term interval. There is stable dilatation of the pancreatic duct within the pancreatic head. Walls of the adjacent duodenum again appear thickened, presumably reactive in nature. Spleen: Normal in size without focal abnormality. Adrenals/Urinary Tract: Adrenal glands appear normal. Bilateral renal stones. No hydronephrosis. No ureteral or bladder calculi are identified. Bladder is unremarkable, partially decompressed Stomach/Bowel: Surgical anastomosis at the rectosigmoid colon level, without obstruction, inflammation or other surgical complicating feature. No dilated large or small bowel loops. Presumed appendectomy. Stomach is unremarkable, partially decompressed. Vascular/Lymphatic: Aorta bi-iliac stents in place. 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 No periaortic hemorrhage or edema appreciated. Bilateral renal artery stents are also in place. Heavy atherosclerotic changes are seen along the remainder of the abdominal aorta and aortic branch vessels. No enlarged lymph nodes seen in the abdomen or pelvis. Reproductive: Presumed hysterectomy. No adnexal mass or free fluid. Other: No free fluid or abscess collection is seen. No free intraperitoneal air. Musculoskeletal: No acute or suspicious osseous finding. Degenerative spondylosis of the slightly scoliotic thoracolumbar spine, mild to moderate in degree. IMPRESSION: 1. New small RIGHT pleural effusion. 2. Stable appearance of the enlarged pancreatic head, with associated coarse calcifications and pancreatic duct dilatation, and with small amount  of fluid stranding  again seen within the peripancreatic soft tissues corresponding to recent episode of acute pancreatitis (acute on chronic pancreatitis). 3. Walls of the adjacent duodenum again appear thickened, likely reactive in nature. 4. Aorta bi-iliac stents in place. 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 No periaortic hemorrhage or edema appreciated. 5. Bilateral nephrolithiasis. No hydronephrosis. No ureteral or bladder calculi. 6. No bowel obstruction. No secondary signs of bowel wall ischemia. No abscess collection or free intraperitoneal air. Aortic Atherosclerosis (ICD10-I70.0). Electronically Signed   By: Franki Cabot M.D.   On: 10/21/2019 13:46   CT ABDOMEN PELVIS WO CONTRAST  Result Date: 10/16/2019 CLINICAL DATA:  Abdominal pain EXAM: CT ABDOMEN AND PELVIS WITHOUT CONTRAST TECHNIQUE: Multidetector CT imaging of the abdomen and pelvis was performed following the standard protocol without IV contrast. COMPARISON:  July 27, 2019 FINDINGS: Lower chest: The visualized heart size within normal limits. No pericardial fluid/thickening. No hiatal hernia. Streaky atelectasis seen at the posterior right lung base. Hepatobiliary: Although limited due to the lack of intravenous contrast, normal in appearance without gross focal abnormality. The patient is status post cholecystectomy. There is stable mildly dilated common bile duct. No common bile duct stones however are seen. Pancreas: There is fat stranding changes and enlargement seen of the pancreatic head with diffuse coarse calcifications seen within the pancreatic head and proximal body. There is also resultant surrounding inflammatory changes and wall thickening seen within the first and second portion of the duodenum. No loculated fluid collection is seen. No pneumoperitoneum. Spleen: Normal in size. Although limited due to the lack of intravenous contrast, normal in appearance. Adrenals/Urinary Tract: Both adrenal glands appear  normal. Multiple bilateral calcifications are seen within the renal hilum, likely vascular and possible punctate renal calculi in the lower poles. No hydronephrosis. No ureteral or bladder calculi are noted. Stomach/Bowel: The stomach Abner major of the small bowel are normal appearance. There is been a prior surgical anastomosis within the sigmoid rectal junction. Scattered colonic diverticula are noted without diverticulitis. Vascular/Lymphatic: There are no enlarged abdominal or pelvic lymph nodes. The patient is status post aorta bi-iliac stent graft. There is unchanged native aneurysm sac measuring 3.9 cm. There is also a bilateral renal arterial stents at the ostia. Dense vascular calcifications are seen throughout. Reproductive: The patient is status post hysterectomy. No adnexal masses or collections seen. Other: No evidence of abdominal wall mass or hernia. Musculoskeletal: No acute or significant osseous findings. 6 IMPRESSION: 1. Findings consistent with acute on chronic pancreatitis involving pancreatic head and proximal body. There is adjacent resultant duodenitis. No pneumoperitoneum or loculated fluid collections seen. 2. Aorta bi-iliac stent graft with unchanged native aneurysm sac size measuring 3.9 cm. 3. Diverticulosis without diverticulitis. Electronically Signed   By: Prudencio Pair M.D.   On: 10/16/2019 19:21   IR Radiologist Eval & Mgmt  Result Date: 10/31/2019 Please refer to notes tab for details about interventional procedure. (Op Note)   Labs:  CBC: Recent Labs    10/17/19 0250 10/19/19 0714 10/21/19 0808 10/23/19 0425  WBC 11.7* 5.0 6.1 8.8  HGB 9.8* 8.9* 8.9* 8.1*  HCT 29.6* 26.9* 27.9* 24.4*  PLT 198 157 167 193    COAGS: Recent Labs    09/24/19 1025  INR 1.0    BMP: Recent Labs    10/16/19 1652 10/16/19 1652 10/17/19 0250 10/17/19 0250 10/19/19 0709 10/21/19 0808 10/25/19 1203 10/25/19 1445  NA 134*  --  136  --  138 137  --   --  K 4.5   < > 4.3    < > 3.8 3.7 2.3* 3.3*  CL 96*  --  100  --  102 101  --   --   CO2 24  --  26  --  29 29  --   --   GLUCOSE 127*  --  90  --  95 86  --   --   BUN 26*  --  33*  --  13 11  --   --   CALCIUM 8.1*  --  7.9*  --  7.8* 7.8*  --   --   CREATININE 3.75*  --  3.61*  --  1.57* 1.46*  --   --   GFRNONAA 12*  --  12*  --  34* 37*  --   --   GFRAA 14*  --  14*  --  39* 43*  --   --    < > = values in this interval not displayed.    LIVER FUNCTION TESTS: Recent Labs    07/26/19 1420 08/23/19 1238 10/16/19 1652 10/17/19 0250  BILITOT 0.8 0.9 0.9 0.9  AST 18 18 20 18   ALT 11 9 15 14   ALKPHOS 80 112 152* 131*  PROT 5.6* 6.0* 5.2* 5.2*  ALBUMIN 2.7* 2.8* 2.4* 2.4*     Assessment and Plan:  Given that she is still having significant episodes of left-sided upper abdominal and epigastric pain, Ms. Grandmaison is interested in pursuing a second celiac plexus injection procedure.  She would like to wait until after some appointments to have her dialysis fistula assessed so I told her we will target around the week of April 19 or 26 to perform a second celiac block procedure under CT guidance at [facility].    Electronically Signed: Azzie Roup 11/14/2019, 1:47 PM     I spent a total of 10 Minutes in remote  clinical consultation, greater than 50% of which was counseling/coordinating care for abdominal pain.    Visit type: Audio only (telephone). Audio (no video) only due to patient's lack of internet/smartphone capability. Alternative for in-person consultation at [facility], [address], [place], [place]. This visit type was conducted due to national recommendations for restrictions regarding the COVID-19 Pandemic (e.g. social distancing).  This format is felt to be most appropriate for this patient at this time.  All issues noted in this document were discussed and addressed.

## 2019-11-15 ENCOUNTER — Other Ambulatory Visit (HOSPITAL_COMMUNITY): Payer: Self-pay | Admitting: Interventional Radiology

## 2019-11-15 DIAGNOSIS — K861 Other chronic pancreatitis: Secondary | ICD-10-CM

## 2019-12-10 ENCOUNTER — Other Ambulatory Visit (HOSPITAL_COMMUNITY): Payer: Medicare Other

## 2019-12-10 ENCOUNTER — Encounter (HOSPITAL_COMMUNITY): Payer: Medicare Other

## 2019-12-12 ENCOUNTER — Other Ambulatory Visit (HOSPITAL_COMMUNITY): Payer: Medicare Other

## 2019-12-12 ENCOUNTER — Encounter (HOSPITAL_COMMUNITY): Payer: Medicare Other

## 2019-12-12 ENCOUNTER — Ambulatory Visit: Payer: Medicare Other

## 2019-12-20 ENCOUNTER — Other Ambulatory Visit: Payer: Self-pay | Admitting: Radiology

## 2019-12-21 ENCOUNTER — Other Ambulatory Visit: Payer: Self-pay | Admitting: Radiology

## 2019-12-24 ENCOUNTER — Other Ambulatory Visit: Payer: Self-pay

## 2019-12-24 ENCOUNTER — Encounter (HOSPITAL_COMMUNITY): Payer: Self-pay

## 2019-12-24 ENCOUNTER — Ambulatory Visit (HOSPITAL_COMMUNITY)
Admission: RE | Admit: 2019-12-24 | Discharge: 2019-12-24 | Disposition: A | Discharge status: Home / Self Care | Payer: Medicare Other | Source: Ambulatory Visit | Attending: Interventional Radiology | Admitting: Interventional Radiology

## 2019-12-24 VITALS — BP 114/49 | HR 66 | Temp 98.0°F | Resp 16 | Ht 65.0 in | Wt 91.2 lb

## 2019-12-24 DIAGNOSIS — K219 Gastro-esophageal reflux disease without esophagitis: Secondary | ICD-10-CM | POA: Insufficient documentation

## 2019-12-24 DIAGNOSIS — I130 Hypertensive heart and chronic kidney disease with heart failure and stage 1 through stage 4 chronic kidney disease, or unspecified chronic kidney disease: Secondary | ICD-10-CM | POA: Insufficient documentation

## 2019-12-24 DIAGNOSIS — E785 Hyperlipidemia, unspecified: Secondary | ICD-10-CM | POA: Diagnosis not present

## 2019-12-24 DIAGNOSIS — K861 Other chronic pancreatitis: Secondary | ICD-10-CM | POA: Insufficient documentation

## 2019-12-24 DIAGNOSIS — I480 Paroxysmal atrial fibrillation: Secondary | ICD-10-CM | POA: Diagnosis not present

## 2019-12-24 DIAGNOSIS — Z955 Presence of coronary angioplasty implant and graft: Secondary | ICD-10-CM | POA: Insufficient documentation

## 2019-12-24 DIAGNOSIS — Z951 Presence of aortocoronary bypass graft: Secondary | ICD-10-CM | POA: Insufficient documentation

## 2019-12-24 DIAGNOSIS — Z7902 Long term (current) use of antithrombotics/antiplatelets: Secondary | ICD-10-CM | POA: Insufficient documentation

## 2019-12-24 DIAGNOSIS — E1151 Type 2 diabetes mellitus with diabetic peripheral angiopathy without gangrene: Secondary | ICD-10-CM | POA: Diagnosis not present

## 2019-12-24 DIAGNOSIS — I252 Old myocardial infarction: Secondary | ICD-10-CM | POA: Insufficient documentation

## 2019-12-24 DIAGNOSIS — I509 Heart failure, unspecified: Secondary | ICD-10-CM | POA: Insufficient documentation

## 2019-12-24 DIAGNOSIS — J449 Chronic obstructive pulmonary disease, unspecified: Secondary | ICD-10-CM | POA: Insufficient documentation

## 2019-12-24 DIAGNOSIS — E1122 Type 2 diabetes mellitus with diabetic chronic kidney disease: Secondary | ICD-10-CM | POA: Insufficient documentation

## 2019-12-24 DIAGNOSIS — Z9981 Dependence on supplemental oxygen: Secondary | ICD-10-CM | POA: Diagnosis not present

## 2019-12-24 DIAGNOSIS — I2510 Atherosclerotic heart disease of native coronary artery without angina pectoris: Secondary | ICD-10-CM | POA: Diagnosis not present

## 2019-12-24 DIAGNOSIS — N189 Chronic kidney disease, unspecified: Secondary | ICD-10-CM | POA: Diagnosis not present

## 2019-12-24 DIAGNOSIS — Z79899 Other long term (current) drug therapy: Secondary | ICD-10-CM | POA: Diagnosis not present

## 2019-12-24 DIAGNOSIS — K859 Acute pancreatitis without necrosis or infection, unspecified: Secondary | ICD-10-CM

## 2019-12-24 LAB — BASIC METABOLIC PANEL
Anion gap: 12 (ref 5–15)
BUN: 11 mg/dL (ref 8–23)
CO2: 28 mmol/L (ref 22–32)
Calcium: 8.4 mg/dL — ABNORMAL LOW (ref 8.9–10.3)
Chloride: 99 mmol/L (ref 98–111)
Creatinine, Ser: 2.3 mg/dL — ABNORMAL HIGH (ref 0.44–1.00)
GFR calc Af Amer: 25 mL/min — ABNORMAL LOW (ref >60)
GFR calc non Af Amer: 21 mL/min — ABNORMAL LOW (ref >60)
Glucose, Bld: 127 mg/dL — ABNORMAL HIGH (ref 70–99)
Potassium: 3.3 mmol/L — ABNORMAL LOW (ref 3.5–5.1)
Sodium: 139 mmol/L (ref 135–145)

## 2019-12-24 LAB — CBC WITH DIFFERENTIAL/PLATELET
Abs Immature Granulocytes: 0.04 10*3/uL (ref 0.00–0.07)
Basophils Absolute: 0.1 10*3/uL (ref 0.0–0.1)
Basophils Relative: 1 %
Eosinophils Absolute: 0.2 10*3/uL (ref 0.0–0.5)
Eosinophils Relative: 2 %
HCT: 35.2 % — ABNORMAL LOW (ref 36.0–46.0)
Hemoglobin: 11.3 g/dL — ABNORMAL LOW (ref 12.0–15.0)
Immature Granulocytes: 1 %
Lymphocytes Relative: 12 %
Lymphs Abs: 1 10*3/uL (ref 0.7–4.0)
MCH: 32.7 pg (ref 26.0–34.0)
MCHC: 32.1 g/dL (ref 30.0–36.0)
MCV: 101.7 fL — ABNORMAL HIGH (ref 80.0–100.0)
Monocytes Absolute: 0.7 10*3/uL (ref 0.1–1.0)
Monocytes Relative: 9 %
Neutro Abs: 6.4 10*3/uL (ref 1.7–7.7)
Neutrophils Relative %: 75 %
Platelets: 199 10*3/uL (ref 150–400)
RBC: 3.46 MIL/uL — ABNORMAL LOW (ref 3.87–5.11)
RDW: 14.6 % (ref 11.5–15.5)
WBC: 8.4 10*3/uL (ref 4.0–10.5)
nRBC: 0 % (ref 0.0–0.2)

## 2019-12-24 LAB — PROTIME-INR
INR: 1 (ref 0.8–1.2)
Prothrombin Time: 12.6 seconds (ref 11.4–15.2)

## 2019-12-24 MED ORDER — LIDOCAINE HCL (PF) 1 % IJ SOLN
INTRAMUSCULAR | Status: AC | PRN
  Administered 2019-12-24: 10 mL via INTRADERMAL

## 2019-12-24 MED ORDER — SODIUM CHLORIDE 0.9 % IV SOLN: INTRAVENOUS | Status: DC

## 2019-12-24 MED ORDER — HYDROMORPHONE HCL 1 MG/ML IJ SOLN
1.0000 mg | INTRAMUSCULAR | Status: DC | PRN
  Administered 2019-12-24 (×2): 1 mg via INTRAVENOUS
  Filled 2019-12-24 (×3): qty 1

## 2019-12-24 MED ORDER — BUPIVACAINE HCL (PF) 0.5 % IJ SOLN: 10.0000 mL | Freq: Once | INTRAMUSCULAR | Status: DC

## 2019-12-24 MED ORDER — IOHEXOL 300 MG/ML  SOLN
INTRAMUSCULAR | Status: AC | PRN
  Administered 2019-12-24: 2 mL

## 2019-12-24 MED ORDER — ALCOHOL 98 % IJ SOLN
INTRAMUSCULAR | Status: AC
  Administered 2019-12-24: 15 mL
  Filled 2019-12-24: qty 25

## 2019-12-24 MED ORDER — BUPIVACAINE HCL (PF) 0.25 % IJ SOLN
INTRAMUSCULAR | Status: AC
  Administered 2019-12-24: 10 mL
  Filled 2019-12-24: qty 30

## 2019-12-24 MED ORDER — CEFAZOLIN SODIUM-DEXTROSE 2-4 GM/100ML-% IV SOLN: 2.0000 g | Freq: Once | INTRAVENOUS | Status: AC

## 2019-12-24 MED ORDER — METHYLPREDNISOLONE ACETATE 80 MG/ML IJ SUSP: 80.0000 mg | Freq: Once | INTRAMUSCULAR | Status: AC

## 2019-12-24 MED ORDER — ALCOHOL 98 % IJ SOLN: 20.0000 mL | Freq: Once | INTRAMUSCULAR | Status: DC

## 2019-12-24 MED ORDER — HYDROMORPHONE HCL 1 MG/ML IJ SOLN
1.0000 mg | INTRAMUSCULAR | Status: AC
  Administered 2019-12-24: 1 mg via INTRAVENOUS

## 2019-12-24 MED ORDER — NALOXONE HCL 0.4 MG/ML IJ SOLN
INTRAMUSCULAR | Status: AC
  Filled 2019-12-24: qty 1

## 2019-12-24 MED ORDER — FLUMAZENIL 0.5 MG/5ML IV SOLN
INTRAVENOUS | Status: AC
  Filled 2019-12-24: qty 5

## 2019-12-24 MED ORDER — HYDROMORPHONE HCL 1 MG/ML IJ SOLN
INTRAMUSCULAR | Status: AC | PRN
  Administered 2019-12-24 (×2): 1 mg via INTRAVENOUS

## 2019-12-24 MED ORDER — MIDAZOLAM HCL 2 MG/2ML IJ SOLN
INTRAMUSCULAR | Status: AC | PRN
  Administered 2019-12-24 (×4): 1 mg via INTRAVENOUS

## 2019-12-24 MED ORDER — ALCOHOL 98 % IJ SOLN
30.0000 mL | Freq: Once | INTRAMUSCULAR | Status: AC
  Filled 2019-12-24: qty 30

## 2019-12-24 MED ORDER — METHYLPREDNISOLONE ACETATE 80 MG/ML IJ SUSP
INTRAMUSCULAR | Status: AC
  Administered 2019-12-24: 80 mg via INTRALESIONAL
  Filled 2019-12-24: qty 1

## 2019-12-24 MED ORDER — CEFAZOLIN SODIUM-DEXTROSE 2-4 GM/100ML-% IV SOLN
INTRAVENOUS | Status: AC
  Administered 2019-12-24: 2 g via INTRAVENOUS
  Filled 2019-12-24: qty 100

## 2019-12-24 MED ORDER — HYDROMORPHONE HCL 2 MG/ML IJ SOLN
INTRAMUSCULAR | Status: AC
  Filled 2019-12-24: qty 2

## 2019-12-24 MED ORDER — MIDAZOLAM HCL 2 MG/2ML IJ SOLN
INTRAMUSCULAR | Status: AC
  Filled 2019-12-24: qty 4

## 2019-12-24 NOTE — H&P (Signed)
Referring Physician(s): O'Toole,D  Supervising Physician: Aletta Edouard  Patient Status:  [facility]  Chief Complaint: Abdominal/back pain   Subjective: Patient familiar to IR service from celiac plexus neurolysis 09/24/2019.  She is status post follow-up tele visit with Dr. Kathlene Cote on 11/14/2019.  She has a history of chronic pancreatitis.  Previous celiac plexus block did improve patient's right-sided abdominal pain, however she continues to have bouts of pancreatitis with both right and left-sided abdominal pain with radiation to mid back, currently taking Dilaudid every 3 hours.  She presents again today for repeat CT-guided celiac plexus neurolysis to assist with pain control.  She denies fever, headache, chest pain, vomiting or abnormal bleeding.  She continues to smoke, does have dyspnea with exertion, occasional cough, abdominal/back pain and intermittent nausea.  Past Medical History:  Diagnosis Date  . AAA (abdominal aortic aneurysm) ([place]) 5/09   3.7x3.3 by u/s 2009  . Abnormal EKG    deep TW inversions chronic  . Anemia   . CAD (coronary artery disease)    a. CABG 03/2011 LIMA to LAD, SVG to OM, SVG to PDA. b. cath 06/25/2015 DES to SVG to rPDA, patent LIMA to LAD, patent SVG to OM  . carotid stenosis 5/09   S/p L CEA ;  60-79% bilat ICA by preCABG dopplers 7/12  . CHF (congestive heart failure) ([place])   . Chronic back pain    "all over" (06/24/2015)  . Chronic bronchitis ([place])    "get it pretty much q yr" (06/24/2015)  . CKD (chronic kidney disease)   . Complication of anesthesia 1966   "problem w/ether"  . COPD (chronic obstructive pulmonary disease) ([place])   . Depression   . Dysrhythmia   . GERD (gastroesophageal reflux disease)    With hiatal hernia  . GIB (gastrointestinal bleeding) 9/11   S/p EGD with cautery at [facility]  . Heart murmur   . History of blood transfusion "a few times"   "all related to anemia" (06/24/2015)  . History of hiatal hernia   .  Hyperlipidemia   . Hypertension   . Mitral regurgitation    3+ MR by intraoperative TEE;  s/p MV repair with Dr. Roxy Manns 7/12  . Myocardial infarction [facility]) 2012 "several"  . On home oxygen therapy    "2 liters at night; negative for sleep apnea"  . PAD (peripheral artery disease) ([facility])    Severe; s/p bilateral renal artery stents, moderate in-stent restenosis  . Pancreatitis 05/2018  . Paroxysmal atrial fibrillation ([facility])    coumadin;  echo 9/07: EF 60%, mild LVH;  s/p Cox Maze 7/12 with LAA clipping  . Subclavian artery stenosis, left ([facility])    stented by Dr. Trula Slade on 10/18 to help flow of her LIMA to LAD  . Type II diabetes mellitus ([place])    Past Surgical History:  Procedure Laterality Date  . A/V FISTULAGRAM Left 03/21/2018   Procedure: A/V FISTULAGRAM;  Surgeon: Serafina Mitchell, MD;  Location: [place] [facility];  Service: Cardiovascular;  Laterality: Left;  . ABDOMINAL AORTIC ENDOVASCULAR STENT GRAFT N/A 01/17/2018   Procedure: ABDOMINAL AORTIC ENDOVASCULAR STENT GRAFT WITH CO2;  Surgeon: Serafina Mitchell, MD;  Location: [place];  Service: Vascular;  Laterality: N/A;  . ABDOMINAL HYSTERECTOMY  1990's  . APPENDECTOMY  Aug. 11, 2016   Ruptured  . AV FISTULA PLACEMENT Left 11/03/2017   Procedure: ARTERIOVENOUS (AV) FISTULA CREATION LEFT ARM;  Surgeon: Serafina Mitchell, MD;  Location: [place];  Service:  Vascular;  Laterality: Left;  . BOWEL RESECTION  2013  . CARDIAC CATHETERIZATION N/A 06/24/2015   Procedure: Left Heart Cath and Cors/Grafts Angiography;  Surgeon: Leonie Man, MD;  Location: [place] [facility];  Service: Cardiovascular;  Laterality: N/A;  . CARDIAC CATHETERIZATION N/A 06/25/2015   Procedure: Coronary Stent Intervention;  Surgeon: Sherren Mocha, MD;  Location: [place] [facility];  Service: Cardiovascular;  Laterality: N/A;  . CAROTID ENDARTERECTOMY Left   . CATARACT EXTRACTION Right 03/27/2018  . CHOLECYSTECTOMY N/A 04/19/2018   Procedure: LAPAROSCOPIC CHOLECYSTECTOMY  WITH INTRAOPERATIVE CHOLANGIOGRAM;  Surgeon: Rolm Bookbinder, MD;  Location: [place];  Service: General;  Laterality: N/A;  . CORONARY ANGIOPLASTY    . CORONARY ARTERY BYPASS GRAFT  03/05/2011   CABG X3 (LIMA to LAD, SVG to OM, SVG to PDA, EVH via left thigh  . CORONARY STENT PLACEMENT    . ESOPHAGOGASTRODUODENOSCOPY N/A 04/12/2018   Procedure: ESOPHAGOGASTRODUODENOSCOPY (EGD);  Surgeon: [name], MD;  [facility];  Service: Endoscopy;  Laterality: N/A;  . ESOPHAGOGASTRODUODENOSCOPY N/A 06/16/2018   Procedure: ESOPHAGOGASTRODUODENOSCOPY (EGD);  Surgeon: Thornton Park, MD;  Location: [place];  Service: Gastroenterology;  Laterality: N/A;  . IR RADIOLOGIST EVAL & MGMT  09/13/2019  . IR RADIOLOGIST EVAL & MGMT  10/31/2019  . IR RADIOLOGIST EVAL & MGMT  11/14/2019  . LACERATION REPAIR Right 1990's   WRIST  . MAZE  03/05/2011   complete biatrial lesion set with clipping of LA appendage  . MITRAL VALVE REPAIR  03/05/2011   55mm Memo 3D ring annuloplasty for ischemic MR  . PERIPHERAL VASCULAR BALLOON ANGIOPLASTY Left 03/21/2018   Procedure: PERIPHERAL VASCULAR BALLOON ANGIOPLASTY;  Surgeon: Serafina Mitchell, MD;  Location: [place] [facility];  Service: Cardiovascular;  Laterality: Left;  . PERIPHERAL VASCULAR CATHETERIZATION N/A 06/24/2015   Procedure: Aortic Arch Angiography;  Surgeon: Serafina Mitchell, MD;  Location: [place] [facility];  Service: Cardiovascular;  Laterality: N/A;  . PERIPHERAL VASCULAR CATHETERIZATION  06/24/2015   Procedure: Peripheral Vascular Intervention;  Surgeon: Serafina Mitchell, MD;  Location: [place] [facility];  Service: Cardiovascular;;  . PERIPHERAL VASCULAR CATHETERIZATION N/A 06/24/2015   Procedure: Abdominal Aortogram;  Surgeon: Serafina Mitchell, MD;  Location: [place] [facility];  Service: Cardiovascular;  Laterality: N/A;  . RENAL ANGIOGRAM Bilateral 09/11/2013   Procedure: RENAL ANGIOGRAM;  Surgeon: Serafina Mitchell, MD;  Location: [facility] C[facility] LAB;   Service: Cardiovascular;  Laterality: Bilateral;  . RIGHT FEMORAL-POPLITEAL BYPASS        Allergies: Isosorbide, Codeine, Contrast media [iodinated diagnostic agents], Ioxaglate, Isosorbide nitrate, Metrizamide, Oxycodone-acetaminophen, and Percocet [oxycodone-acetaminophen]  Medications: Prior to Admission medications   Medication Sig Start Date End Date Taking? Authorizing Provider  allopurinol (ZYLOPRIM) 100 MG tablet Take 100 mg by mouth daily. 02/02/17  Yes [provider]  amLODipine (NORVASC) 5 MG tablet Take 1 tablet (5 mg total) by mouth daily. Patient taking differently: Take 10 mg by mouth daily.  08/23/18  Yes Mikhail, Maryann, DO  atorvastatin (LIPITOR) 40 MG tablet Take 40 mg by mouth daily.  12/07/17  Yes [provider]  carvedilol (COREG) 25 MG tablet Take 25 mg by mouth 2 (two) times daily. 04/16/19  Yes [provider]  CREON 12000 units CPEP capsule Take 2 capsules (24,000 Units total) by mouth 3 (three) times daily before meals. 05/18/19  Yes Regalado, Belkys A, MD  diphenhydrAMINE (BENADRYL) 25 MG tablet Take 50 mg by mouth at bedtime.   Yes [provider]  hydrALAZINE (APRESOLINE) 25 MG tablet Take 1 tablet (25 mg total) by mouth every 8 (eight) hours. 06/26/18  Yes Thurnell Lose, MD  HYDROmorphone (DILAUDID) 2 MG tablet Take 2-4 mg by mouth every 4 (four) hours.    Yes [provider]  OXYGEN Inhale 2 L into the lungs at bedtime.    Yes [provider]  pantoprazole (PROTONIX) 40 MG tablet Take 1 tablet (40 mg total) by mouth daily. 08/03/18  Yes Patrecia Pour, MD  promethazine (PHENERGAN) 12.5 MG tablet Take 12.5 mg by mouth every 4 (four) hours as needed for nausea or vomiting.   Yes [provider]  traZODone (DESYREL) 100 MG tablet Take 150 mg by mouth at bedtime.  04/19/19  Yes [provider]  Albuterol Sulfate (PROAIR RESPICLICK) 914 (90 Base) MCG/ACT AEPB Inhale 1 puff into the lungs every  4 (four) hours as needed (shortness of breath/wheezing).    [provider]  B Complex-C-Zn-Folic Acid (DIALYVITE 782-NFAO 15) 0.8 MG TABS Take 1 tablet by mouth daily. 08/09/19   [provider]  clopidogrel (PLAVIX) 75 MG tablet Take 75 mg by mouth daily. 10/02/19   [provider]  gabapentin (NEURONTIN) 300 MG capsule Take 1 capsule (300 mg total) by mouth at bedtime. 08/22/18   Cristal Ford, DO     Vital Signs: BP (!) 130/59 (BP Location: Right Arm)   Pulse 66   Temp 98.2 F (36.8 C) (Oral)   Resp 16   Ht 5\' 5"  (1.651 m)   Wt 91 lb 3.2 oz (41.4 kg)   SpO2 94%   BMI 15.18 kg/m   Physical Exam awake, alert.  Chest clear to auscultation bilaterally.  Heart with regular rate and rhythm.  Abdomen soft, positive bowel sounds, tender epigastric region to palpation.  No lower extremity edema.  Imaging: No results found.  Labs:  CBC: Recent Labs    10/19/19 0714 10/21/19 0808 10/23/19 0425 12/24/19 0955  WBC 5.0 6.1 8.8 8.4  HGB 8.9* 8.9* 8.1* 11.3*  HCT 26.9* 27.9* 24.4* 35.2*  PLT 157 167 193 199    COAGS: Recent Labs    09/24/19 1025  INR 1.0    BMP: Recent Labs    10/16/19 1652 10/16/19 1652 10/17/19 0250 10/17/19 0250 10/19/19 0709 10/21/19 0808 10/25/19 1203 10/25/19 1445  NA 134*  --  136  --  138 137  --   --   K 4.5   < > 4.3   < > 3.8 3.7 2.3* 3.3*  CL 96*  --  100  --  102 101  --   --   CO2 24  --  26  --  29 29  --   --   GLUCOSE 127*  --  90  --  95 86  --   --   BUN 26*  --  33*  --  13 11  --   --   CALCIUM 8.1*  --  7.9*  --  7.8* 7.8*  --   --   CREATININE 3.75*  --  3.61*  --  1.57* 1.46*  --   --   GFRNONAA 12*  --  12*  --  34* 37*  --   --   GFRAA 14*  --  14*  --  39* 43*  --   --    < > = values in this interval not displayed.    LIVER FUNCTION TESTS: Recent Labs  07/26/19 1420 08/23/19 1238 10/16/19 1652 10/17/19 0250  BILITOT 0.8 0.9 0.9 0.9  AST 18 18 20 18   ALT 11 9 15 14   ALKPHOS 80  112 152* 131*  PROT 5.6* 6.0* 5.2* 5.2*  ALBUMIN 2.7* 2.8* 2.4* 2.4*    Assessment and Plan: Patient with history of intractable chronic abdominal/back pain secondary to chronic pancreatitis, currently on Dilaudid regimen; status post prior celiac plexus neurolysis on 09/24/2019.  She presents today following recent discussions with Dr. Kathlene Cote on 11/14/2019 for repeat CT-guided celiac plexus neurolysis to treat persistent abd/back pain.  Details/risks of procedure, including but not limited to, internal bleeding, infection, injury to adjacent structures discussed with patient with her understanding and consent.   Electronically Signed: D. Rowe Robert, PA-C 12/24/2019, 10:28 AM   I spent a total of 15 minutes at the the patient's bedside AND on the patient's hospital floor or unit, greater than 50% of which was counseling/coordinating care for CT-guided celiac plexus neurolysis

## 2019-12-24 NOTE — Discharge Instructions (Signed)
Please call Interventional Radiology clinic [phone] with any questions or concerns.  You may remove your dressing and shower tomorrow.   Orthostatic Hypotension is a possible side effect of your procedure today.    Please drink plenty of fluids for the next 48-72 hours.  Please stand up slowly, if you feel dizzy sit back down  Blood pressure is a measurement of how strongly, or weakly, your blood is pressing against the walls of your arteries. Orthostatic hypotension is a sudden drop in blood pressure that happens when you quickly change positions, such as when you get up from sitting or lying down. Arteries are blood vessels that carry blood from your heart throughout your body. When blood pressure is too low, you may not get enough blood to your brain or to the rest of your organs. This can cause weakness, light-headedness, rapid heartbeat, and fainting. This can last for just a few seconds or for up to a few minutes. Orthostatic hypotension is usually not a serious problem. However, if it happens frequently or gets worse, it may be a sign of something more serious. What are the causes? This condition may be caused by:  Sudden changes in posture, such as standing up quickly after you have been sitting or lying down.  Blood loss.  Loss of body fluids (dehydration).  Heart problems.  Hormone (endocrine) problems.  Pregnancy.  Severe infection.  Lack of certain nutrients.  Severe allergic reactions (anaphylaxis).  Certain medicines, such as blood pressure medicine or medicines that make the body lose excess fluids (diuretics). Sometimes, this condition can be caused by not taking medicine as directed, such as taking too much of a certain medicine. What increases the risk? The following factors may make you more likely to develop this condition:  Age. Risk increases as you get older.  Conditions that affect the heart or the central nervous system.  Taking certain  medicines, such as blood pressure medicine or diuretics.  Being pregnant. What are the signs or symptoms? Symptoms of this condition may include:  Weakness.  Light-headedness.  Dizziness.  Blurred vision.  Fatigue.  Rapid heartbeat.  Fainting, in severe cases. How is this diagnosed? This condition is diagnosed based on:  Your medical history.  Your symptoms.  Your blood pressure measurement. Your health care provider will check your blood pressure when you are: ? Lying down. ? Sitting. ? Standing. A blood pressure reading is recorded as two numbers, such as "120 over 80" (or 120/80). The first ("top") number is called the systolic pressure. It is a measure of the pressure in your arteries as your heart beats. The second ("bottom") number is called the diastolic pressure. It is a measure of the pressure in your arteries when your heart relaxes between beats. Blood pressure is measured in a unit called mm Hg. Healthy blood pressure for most adults is 120/80. If your blood pressure is below 90/60, you may be diagnosed with hypotension. Other information or tests that may be used to diagnose orthostatic hypotension include:  Your other vital signs, such as your heart rate and temperature.  Blood tests.  Tilt table test. For this test, you will be safely secured to a table that moves you from a lying position to an upright position. Your heart rhythm and blood pressure will be monitored during the test. How is this treated? This condition may be treated by:  Changing your diet. This may involve eating more salt (sodium) or drinking more water.  Taking medicines to  raise your blood pressure.  Changing the dosage of certain medicines you are taking that might be lowering your blood pressure.  Wearing compression stockings. These stockings help to prevent blood clots and reduce swelling in your legs. In some cases, you may need to go to the hospital for:  Fluid replacement.  This means you will receive fluids through an IV.  Blood replacement. This means you will receive donated blood through an IV (transfusion).  Treating an infection or heart problems, if this applies.  Monitoring. You may need to be monitored while medicines that you are taking wear off. Follow these instructions at home: Eating and drinking   Drink enough fluid to keep your urine pale yellow.  Eat a healthy diet, and follow instructions from your health care provider about eating or drinking restrictions. A healthy diet includes: ? Fresh fruits and vegetables. ? Whole grains. ? Lean meats. ? Low-fat dairy products.  Eat extra salt only as directed. Do not add extra salt to your diet unless your health care provider told you to do that.  Eat frequent, small meals.  Avoid standing up suddenly after eating. Medicines  Take over-the-counter and prescription medicines only as told by your health care provider. ? Follow instructions from your health care provider about changing the dosage of your current medicines, if this applies. ? Do not stop or adjust any of your medicines on your own. General instructions   Wear compression stockings as told by your health care provider.  Get up slowly from lying down or sitting positions. This gives your blood pressure a chance to adjust.  Avoid hot showers and excessive heat as directed by your health care provider.  Return to your normal activities as told by your health care provider. Ask your health care provider what activities are safe for you.  Do not use any products that contain nicotine or tobacco, such as cigarettes, e-cigarettes, and chewing tobacco. If you need help quitting, ask your health care provider.  Keep all follow-up visits as told by your health care provider. This is important. Contact a health care provider if you:  Vomit.  Have diarrhea.  Have a fever for more than 2-3 days.  Feel more thirsty than  usual.  Feel weak and tired. Get help right away if you:  Have chest pain.  Have a fast or irregular heartbeat.  Develop numbness in any part of your body.  Cannot move your arms or your legs.  Have trouble speaking.  Become sweaty or feel light-headed.  Faint.  Feel short of breath.  Have trouble staying awake.  Feel confused. Summary  Orthostatic hypotension is a sudden drop in blood pressure that happens when you quickly change positions.  Orthostatic hypotension is usually not a serious problem.  It is diagnosed by having your blood pressure taken lying down, sitting, and then standing.  It may be treated by changing your diet or adjusting your medicines. This information is not intended to replace advice given to you by your health care provider. Make sure you discuss any questions you have with your health care provider. Document Revised: 02/16/2018 Document Reviewed: 02/16/2018 Elsevier Patient Education  [place].   Moderate Conscious Sedation, Adult, Care After These instructions provide you with information about caring for yourself after your procedure. Your health care provider may also give you more specific instructions. Your treatment has been planned according to current medical practices, but problems sometimes occur. Call your health care provider if you  have any problems or questions after your procedure. What can I expect after the procedure? After your procedure, it is common:  To feel sleepy for several hours.  To feel clumsy and have poor balance for several hours.  To have poor judgment for several hours.  To vomit if you eat too soon. Follow these instructions at home: For at least 24 hours after the procedure:   Do not: ? Participate in activities where you could fall or become injured. ? Drive. ? Use heavy machinery. ? Drink alcohol. ? Take sleeping pills or medicines that cause drowsiness. ? Make important decisions or  sign legal documents. ? Take care of children on your own.  Rest. Eating and drinking  Follow the diet recommended by your health care provider.  If you vomit: ? Drink water, juice, or soup when you can drink without vomiting. ? Make sure you have little or no nausea before eating solid foods. General instructions  Have a responsible adult stay with you until you are awake and alert.  Take over-the-counter and prescription medicines only as told by your health care provider.  If you smoke, do not smoke without supervision.  Keep all follow-up visits as told by your health care provider. This is important. Contact a health care provider if:  You keep feeling nauseous or you keep vomiting.  You feel light-headed.  You develop a rash.  You have a fever. Get help right away if:  You have trouble breathing. This information is not intended to replace advice given to you by your health care provider. Make sure you discuss any questions you have with your health care provider. Document Revised: 08/05/2017 Document Reviewed: 12/13/2015 Elsevier Patient Education  [place].   Nerve Block, Care After This sheet gives you information about how to care for yourself after your procedure. Your health care provider may also give you more specific instructions. If you have problems or questions, contact your health care provider. What can I expect after the procedure? After the procedure, it is common to feel some discomfort at the injection (puncture) site. The injection may temporarily reduce or eliminate pain in the part of the body that is supplied by the nerve. Follow these instructions at home: Activity  Do not drive or use heavy machinery while taking prescription pain medicine.  Do not drive for 24 hours if you were given a medicine to help you relax (sedative) during the procedure.  Do not lift anything that is heavier than 10 lb (4.5 kg), or the limit that you are  told, until your health care provider says that it is safe.  Return to your normal activities as directed. Ask your health care provider what activities are safe for you. Puncture site care  Remove your bandage (dressing) 24 hours after your procedure, or as told by your health care provider.  Check your puncture site every day for signs of infection. Check for: ? Redness, swelling, or pain. ? Warmth. ? Fluid or blood. ? Pus or a bad smell. General instructions   Take over-the-counter and prescription medicines only as told by your health care provider.  Do not take showers or baths, swim, or use a hot tub until your health care provider approves. In most cases, you may shower and bathe starting 24 hours after your procedure.  If directed, put ice on the puncture area to relieve discomfort. ? Put ice in a plastic bag. ? Place a towel between your skin and the  bag. ? Leave the ice on for 20 minutes, 2-3 times a day.  Keep all follow-up visits as told by your health care provider. This is important. Contact a health care provider if:  You have redness, swelling, or pain around your puncture site.  You have fluid or blood coming from your puncture site.  You have pus or a bad smell coming from your puncture site.  Your puncture site feels warm to the touch.  You have a fever. Get help right away if:  You have: ? Shortness of breath. ? A severe headache. ? Pain that gets worse.  You develop weakness or loss of feeling (numbness).  You develop muscle weakness or paralysis. Summary  Do not drive for 24 hours if you were given a sedative.  Check your puncture site every day for signs of infection.  Return to your normal activities as directed. Ask your health care provider what activities are safe for you.  Take over-the-counter and prescription medicines only as told by your health care provider. This information is not intended to replace advice given to you by your  health care provider. Make sure you discuss any questions you have with your health care provider. Document Revised: 08/05/2017 Document Reviewed: 06/03/2017 Elsevier Patient Education  2020 [employer].

## 2019-12-24 NOTE — Procedures (Signed)
Interventional Radiology Procedure Note  Procedure: CT guided celiac plexus block/neurolysis  Complications: None  Estimated Blood Loss: None  Findings: CT guided injection via [place] needle positioned in region of celiac plexus with 10 mL of 0.5% Sensorcaine, 80 mg Depo-medrol and 15 mL of absolute alcohol. Prior to injection, 4 mL of a diluted contrast solution injected to evaluate spillage pattern.  Venetia Night. Kathlene Cote, M.D Pager:  [phone]

## 2020-01-02 ENCOUNTER — Encounter: Payer: Self-pay | Admitting: *Deleted

## 2020-01-02 ENCOUNTER — Ambulatory Visit
Admission: RE | Admit: 2020-01-02 | Discharge: 2020-01-02 | Disposition: A | Discharge status: Home / Self Care | Payer: Medicare Other | Source: Ambulatory Visit | Attending: Radiology | Admitting: Radiology

## 2020-01-02 ENCOUNTER — Other Ambulatory Visit: Payer: Self-pay

## 2020-01-02 DIAGNOSIS — K859 Acute pancreatitis without necrosis or infection, unspecified: Secondary | ICD-10-CM

## 2020-01-02 HISTORY — PX: IR RADIOLOGIST EVAL & MGMT: IMG5224

## 2020-01-02 NOTE — Progress Notes (Signed)
Chief Complaint: Patient was consulted remotely today (TeleHealth) for follow up after celiac plexus block.  History of Present Illness: [name] is a 69 y.o. female with a history of chronic pancreatitis who is status post CT-guided neurolysis of the celiac plexus on 1/18/2021to treat intractable chronic abdominal pain.   The first procedure did improve some of her right-sided abdominal pain.  Due to continued significant episodes of primarily left-sided upper abdominal and epigastric pain as well as a component of back pain, decision was made to proceed with a second celiac plexus neurolytic injection procedure on 12/24/2019. Since the second celiac block, Mrs. Boak states that her general abdominal pain has improved.  She is having some continued back pain.  She does state that she is quite weak and that her legs feel "like rubber".  She is using a walker and her arms also feel weak.  She has continued to have diminished appetite and is not eating or drinking much food or fluids.  She feels that she is continuing to lose weight especially since her last hospitalization in February. She denies nausea, vomiting, fever or chills currently.   Past Medical History:  Diagnosis Date   AAA (abdominal aortic aneurysm) ([place]) 5/09   3.7x3.3 by u/s 2009   Abnormal EKG    deep TW inversions chronic   Anemia    CAD (coronary artery disease)    a. CABG 03/2011 LIMA to LAD, SVG to OM, SVG to PDA. b. cath 06/25/2015 DES to SVG to rPDA, patent LIMA to LAD, patent SVG to OM   carotid stenosis 5/09   S/p L CEA ;  60-79% bilat ICA by preCABG dopplers 7/12   CHF (congestive heart failure) (HCC)    Chronic back pain    "all over" (06/24/2015)   Chronic bronchitis ([place])    "get it pretty much q yr" (06/24/2015)   CKD (chronic kidney disease)    Complication of anesthesia 1966   "problem w/ether"   COPD (chronic obstructive pulmonary disease) ([place])    Depression    Dysrhythmia     GERD (gastroesophageal reflux disease)    With hiatal hernia   GIB (gastrointestinal bleeding) 9/11   S/p EGD with cautery at [facility]   Heart murmur    History of blood transfusion "a few times"   "all related to anemia" (06/24/2015)   History of hiatal hernia    Hyperlipidemia    Hypertension    Mitral regurgitation    3+ MR by intraoperative TEE;  s/p MV repair with Dr. Roxy Manns 7/12   Myocardial infarction [facility]) 2012 "several"   On home oxygen therapy    "2 liters at night; negative for sleep apnea"   PAD (peripheral artery disease) ([place])    Severe; s/p bilateral renal artery stents, moderate in-stent restenosis   Pancreatitis 05/2018   Paroxysmal atrial fibrillation (HCC)    coumadin;  echo 9/07: EF 60%, mild LVH;  s/p Cox Maze 7/12 with LAA clipping   Subclavian artery stenosis, left ([place])    stented by Dr. Trula Slade on 10/18 to help flow of her LIMA to LAD   Type II diabetes mellitus [facility])     Past Surgical History:  Procedure Laterality Date   A/V FISTULAGRAM Left 03/21/2018   Procedure: A/V FISTULAGRAM;  Surgeon: Serafina Mitchell, MD;  Location: [place] [facility];  Service: Cardiovascular;  Laterality: Left;   ABDOMINAL AORTIC ENDOVASCULAR STENT GRAFT N/A 01/17/2018   Procedure: ABDOMINAL AORTIC ENDOVASCULAR STENT  GRAFT WITH CO2;  Surgeon: Serafina Mitchell, MD;  Location: [facility] OR;  Service: Vascular;  Laterality: N/A;   ABDOMINAL HYSTERECTOMY  1990's   APPENDECTOMY  Aug. 11, 2016   Ruptured   AV FISTULA PLACEMENT Left 11/03/2017   Procedure: ARTERIOVENOUS (AV) FISTULA CREATION LEFT ARM;  Surgeon: Serafina Mitchell, MD;  Location: [place];  Service: Vascular;  Laterality: Left;   BOWEL RESECTION  2013   CARDIAC CATHETERIZATION N/A 06/24/2015   Procedure: Left Heart Cath and Cors/Grafts Angiography;  Surgeon: Leonie Man, MD;  Location: [place] [facility];  Service: Cardiovascular;  Laterality: N/A;   CARDIAC CATHETERIZATION N/A 06/25/2015   Procedure: Coronary  Stent Intervention;  Surgeon: Sherren Mocha, MD;  Location: [place] [facility];  Service: Cardiovascular;  Laterality: N/A;   CAROTID ENDARTERECTOMY Left    CATARACT EXTRACTION Right 03/27/2018   CHOLECYSTECTOMY N/A 04/19/2018   Procedure: LAPAROSCOPIC CHOLECYSTECTOMY WITH INTRAOPERATIVE CHOLANGIOGRAM;  Surgeon: Rolm Bookbinder, MD;  Location: [place];  Service: General;  Laterality: N/A;   CORONARY ANGIOPLASTY     CORONARY ARTERY BYPASS GRAFT  03/05/2011   CABG X3 (LIMA to LAD, SVG to OM, SVG to PDA, EVH via left thigh   CORONARY STENT PLACEMENT     ESOPHAGOGASTRODUODENOSCOPY N/A 04/12/2018   Procedure: ESOPHAGOGASTRODUODENOSCOPY (EGD);  Surgeon: [name], MD;  Location: [facility] ENDOSCOPY;  Service: Endoscopy;  Laterality: N/A;   ESOPHAGOGASTRODUODENOSCOPY N/A 06/16/2018   Procedure: ESOPHAGOGASTRODUODENOSCOPY (EGD);  Surgeon: Thornton Park, MD;  Location: [place];  Service: Gastroenterology;  Laterality: N/A;   IR RADIOLOGIST EVAL & MGMT  09/13/2019   IR RADIOLOGIST EVAL & MGMT  10/31/2019   IR RADIOLOGIST EVAL & MGMT  11/14/2019   LACERATION REPAIR Right 1990's   WRIST   MAZE  03/05/2011   complete biatrial lesion set with clipping of LA appendage   MITRAL VALVE REPAIR  03/05/2011   48mm Memo 3D ring annuloplasty for ischemic MR   PERIPHERAL VASCULAR BALLOON ANGIOPLASTY Left 03/21/2018   Procedure: PERIPHERAL VASCULAR BALLOON ANGIOPLASTY;  Surgeon: Serafina Mitchell, MD;  Location: [place] [facility];  Service: Cardiovascular;  Laterality: Left;   PERIPHERAL VASCULAR CATHETERIZATION N/A 06/24/2015   Procedure: Aortic Arch Angiography;  Surgeon: Serafina Mitchell, MD;  Location: [place] [facility];  Service: Cardiovascular;  Laterality: N/A;   PERIPHERAL VASCULAR CATHETERIZATION  06/24/2015   Procedure: Peripheral Vascular Intervention;  Surgeon: Serafina Mitchell, MD;  Location: [place] [facility];  Service: Cardiovascular;;   PERIPHERAL VASCULAR CATHETERIZATION N/A  06/24/2015   Procedure: Abdominal Aortogram;  Surgeon: Serafina Mitchell, MD;  Location: [place] [facility];  Service: Cardiovascular;  Laterality: N/A;   RENAL ANGIOGRAM Bilateral 09/11/2013   Procedure: RENAL ANGIOGRAM;  Surgeon: Serafina Mitchell, MD;  Location: [facility] CATH LAB;  Service: Cardiovascular;  Laterality: Bilateral;   RIGHT FEMORAL-POPLITEAL BYPASS      Allergies: Isosorbide, Codeine, Contrast media [iodinated diagnostic agents], Ioxaglate, Isosorbide nitrate, Metrizamide, Oxycodone-acetaminophen, and Percocet [oxycodone-acetaminophen]  Medications: Prior to Admission medications   Medication Sig Start Date End Date Taking? Authorizing Provider  Albuterol Sulfate (PROAIR RESPICLICK) 790 (90 Base) MCG/ACT AEPB Inhale 1 puff into the lungs every 4 (four) hours as needed (shortness of breath/wheezing).    [provider]  allopurinol (ZYLOPRIM) 100 MG tablet Take 100 mg by mouth daily. 02/02/17   [provider]  amLODipine (NORVASC) 5 MG tablet Take 1 tablet (5 mg total) by mouth daily. Patient taking differently: Take 10 mg by mouth daily.  08/23/18   Mikhail, Velta Addison, DO  atorvastatin (LIPITOR) 40 MG tablet Take 40 mg by mouth daily.  12/07/17   [provider]  B Complex-C-Zn-Folic Acid (DIALYVITE 010-XNAT 15) 0.8 MG TABS Take 1 tablet by mouth daily. 08/09/19   [provider]  carvedilol (COREG) 25 MG tablet Take 25 mg by mouth 2 (two) times daily. 04/16/19   [provider]  clopidogrel (PLAVIX) 75 MG tablet Take 75 mg by mouth daily. 10/02/19   [provider]  CREON 12000 units CPEP capsule Take 2 capsules (24,000 Units total) by mouth 3 (three) times daily before meals. 05/18/19   Regalado, Belkys A, MD  diphenhydrAMINE (BENADRYL) 25 MG tablet Take 50 mg by mouth at bedtime.    [provider]  gabapentin (NEURONTIN) 300 MG capsule Take 1 capsule (300 mg total) by mouth at bedtime. 08/22/18   Mikhail, Velta Addison, DO    hydrALAZINE (APRESOLINE) 25 MG tablet Take 1 tablet (25 mg total) by mouth every 8 (eight) hours. 06/26/18   Thurnell Lose, MD  HYDROmorphone (DILAUDID) 2 MG tablet Take 2-4 mg by mouth every 4 (four) hours.     [provider]  OXYGEN Inhale 2 L into the lungs at bedtime.     [provider]  pantoprazole (PROTONIX) 40 MG tablet Take 1 tablet (40 mg total) by mouth daily. 08/03/18   Patrecia Pour, MD  promethazine (PHENERGAN) 12.5 MG tablet Take 12.5 mg by mouth every 4 (four) hours as needed for nausea or vomiting.    [provider]  traZODone (DESYREL) 100 MG tablet Take 150 mg by mouth at bedtime.  04/19/19   [provider]     Family History  Problem Relation Age of Onset   Emphysema Mother    Heart disease Mother        before age 37   Hypertension Mother    Hyperlipidemia Mother    Heart attack Mother    AAA (abdominal aortic aneurysm) Mother        rupture   Heart attack Father 31   Emphysema Father    Heart disease Father        before age 73   Hyperlipidemia Father    Hypertension Father    Peripheral vascular disease Father    Diabetes Brother    Heart disease Brother        before age 67   Hyperlipidemia Brother    Hypertension Brother    Heart attack Brother        CABG   Heart disease Other        Vascular disease in grandparents, uncles and dad   Colon cancer Neg Hx    Esophageal cancer Neg Hx    Inflammatory bowel disease Neg Hx    Liver disease Neg Hx    Pancreatic cancer Neg Hx    Rectal cancer Neg Hx    Stomach cancer Neg Hx     Social History   Socioeconomic History   Marital status: Widowed    Spouse name: Not on file   Number of children: Not on file   Years of education: Not on file   Highest education level: Not on file  Occupational History   Occupation: Scientist, water quality in past    Employer: DISABLED  Tobacco Use   Smoking status: Current Every Day Smoker    Packs/day:  0.75    Years: 50.00    Pack years: 37.50    Types: Cigarettes  Smokeless tobacco: Never Used   Tobacco comment: 3/4 pk per day  Substance and Sexual Activity   Alcohol use: Yes    Alcohol/week: 0.0 standard drinks   Drug use: Yes    Types: Marijuana   Sexual activity: Not Currently    Birth control/protection: None  Other Topics Concern   Not on file  Social History Narrative   Widowed in 2009.   Social Determinants of Health   Financial Resource Strain:    Difficulty of Paying Living Expenses:   Food Insecurity:    Worried About Charity fundraiser in the Last Year:    Arboriculturist in the Last Year:   Transportation Needs:    Film/video editor (Medical):    Lack of Transportation (Non-Medical):   Physical Activity:    Days of Exercise per Week:    Minutes of Exercise per Session:   Stress:    Feeling of Stress :   Social Connections:    Frequency of Communication with Friends and Family:    Frequency of Social Gatherings with Friends and Family:    Attends Religious Services:    Active Member of Clubs or Organizations:    Attends Archivist Meetings:    Marital Status:      Review of Systems  Constitutional: Positive for appetite change, fatigue and unexpected weight change.  Respiratory: Negative.   Cardiovascular: Negative.   Gastrointestinal: Positive for abdominal pain.  Genitourinary: Negative.   Musculoskeletal: Positive for back pain.  Neurological: Negative.     Review of Systems: A 12 point ROS discussed and pertinent positives are indicated in the HPI above.  All other systems are negative.  Physical Exam No direct physical exam was performed (except for noted visual exam findings with Video Visits).   Vital Signs: There were no vitals taken for this visit.  Imaging: CT BIOPSY  Result Date: 12/24/2019 CLINICAL DATA:  History of chronic intractable abdominal pain secondary to chronic pancreatitis. Status  post prior CT-guided neurolysis of the celiac plexus on 09/24/2019 which resulted in some improvement in right-sided abdominal pain. Due to persistent midline and left-sided pain requiring ex elevated use of pain medications, a second celiac block is now performed. EXAM: CT GUIDED NEUROLYTIC ABLATION OF THE CELIAC AXIS ANESTHESIA/SEDATION: Versed 4.0 mg IV, dilaudid 2 mg IV Total Moderate Sedation Time 32 minutes. The patient's level of consciousness and physiologic status were continuously monitored during the procedure by Radiology nursing. PROCEDURE: The procedure risks, benefits, and alternatives were explained to the patient. Questions regarding the procedure were encouraged and answered. The patient understands and consents to the procedure. A time-out was performed prior to initiating the procedure. The anterior abdominal wall was prepped with chlorhexidine in a sterile fashion, and a sterile drape was applied covering the operative field. A sterile gown and sterile gloves were used for the procedure. Local anesthesia was provided with 1% Lidocaine. A 22 gauge Chiba needle was advanced under CT guidance to the level of the celiac plexus. After confirming needle tip position, approximately 4 mL of a 1:10 dilution of Omnipaque-300 contrast and sterile saline was injected. Spread of diluted contrast material was confirmed by CT. A mixture of 7 ml of 0.5% Sensorcaine and 80 mg Depo-Medrol was then made. This was injected through the needle. Alcohol ablation of the celiac plexus was then performed with injection of 15 ml of absolute alcohol. An additional 3 mL of 0.5% Sensorcaine was injected after alcohol injection. COMPLICATIONS: None FINDINGS:  Contrast injection showed spread across the midline and predominantly towards the left at the level of the celiac plexus. Alcohol ablation was successfully performed. Alcohol injection was stopped when the patient did start to experience exacerbation of pain which was  felt today as primarily left shoulder pain radiating into the back. Some additional Sensorcaine was injected after alcohol ablation. IMPRESSION: CT guided neurolytic ablation of the celiac plexus as above. Alcohol ablation was performed with 15 ml of absolute alcohol. Therapeutic injection of Sensorcaine and Depo-Medrol was also performed. Electronically Signed   By: Aletta Edouard M.D.   On: 12/24/2019 13:01    Labs:  CBC: Recent Labs    10/19/19 0714 10/21/19 0808 10/23/19 0425 12/24/19 0955  WBC 5.0 6.1 8.8 8.4  HGB 8.9* 8.9* 8.1* 11.3*  HCT 26.9* 27.9* 24.4* 35.2*  PLT 157 167 193 199    COAGS: Recent Labs    09/24/19 1025 12/24/19 0955  INR 1.0 1.0    BMP: Recent Labs    10/17/19 0250 10/17/19 0250 10/19/19 0709 10/19/19 0709 10/21/19 0808 10/25/19 1203 10/25/19 1445 12/24/19 0955  NA 136  --  138  --  137  --   --  139  K 4.3   < > 3.8   < > 3.7 2.3* 3.3* 3.3*  CL 100  --  102  --  101  --   --  99  CO2 26  --  29  --  29  --   --  28  GLUCOSE 90  --  95  --  86  --   --  127*  BUN 33*  --  13  --  11  --   --  11  CALCIUM 7.9*  --  7.8*  --  7.8*  --   --  8.4*  CREATININE 3.61*  --  1.57*  --  1.46*  --   --  2.30*  GFRNONAA 12*  --  34*  --  37*  --   --  21*  GFRAA 14*  --  39*  --  43*  --   --  25*   < > = values in this interval not displayed.    LIVER FUNCTION TESTS: Recent Labs    07/26/19 1420 08/23/19 1238 10/16/19 1652 10/17/19 0250  BILITOT 0.8 0.9 0.9 0.9  AST 18 18 20 18   ALT 11 9 15 14   ALKPHOS 80 112 152* 131*  PROT 5.6* 6.0* 5.2* 5.2*  ALBUMIN 2.7* 2.8* 2.4* 2.4*    TUMOR MARKERS: No results for input(s): AFPTM, CEA, CA199, CHROMGRNA in the last 8760 hours.  Assessment and Plan:  I spoke with Mrs. Selden by phone.  She does seem to have had some response to a second celiac plexus neurolysis injection procedure which included a combination of anesthetic, steroid and alcohol.  She does feel that her abdominal pain has  improved.  She continues to have back pain and now complains of significant generalized weakness.  There may be a component of malnutrition and dehydration as she continues to lose weight and states that she is not eating and drinking much.  She is about to reinstate gastroenterology care with Dr. Rush Landmark soon and I recommended discussing these symptoms and problems with him as she may be malnourished.  I did not recommend that we pursue another celiac plexus block anytime soon as she has now had 2 separate neurolytic injection procedures since January and repeating another procedure too soon could  increase risks of complication from regional tissue damage.   Electronically Signed: Azzie Roup 01/02/2020, 11:13 AM     I spent a total of 10 Minutes in remote  clinical consultation, greater than 50% of which was counseling/coordinating care for follow-up after celiac plexus neurolytic ablation.    Visit type: Audio only (telephone). Audio (no video) only due to patient's lack of internet/smartphone capability. Alternative for in-person consultation at [facility], [address], [place], [place]. This visit type was conducted due to national recommendations for restrictions regarding the COVID-19 Pandemic (e.g. social distancing).  This format is felt to be most appropriate for this patient at this time.  All issues noted in this document were discussed and addressed.

## 2020-01-03 NOTE — Telephone Encounter (Signed)
Records received from [facility] and sent to Dr. Rush Landmark for review.    [place], FYI.

## 2020-01-07 NOTE — Telephone Encounter (Signed)
Per Dr. Rush Landmark okay to schedule pt for next available appointment. I will keep records at my desk, until patients appointment.

## 2020-01-19 ENCOUNTER — Other Ambulatory Visit: Payer: Self-pay

## 2020-01-19 ENCOUNTER — Emergency Department: Payer: Medicare Other

## 2020-01-19 ENCOUNTER — Observation Stay
Admission: EM | Admit: 2020-01-19 | Discharge: 2020-01-20 | Discharge status: Against Medical Advice | Payer: Medicare Other | Attending: Family Medicine | Admitting: Family Medicine

## 2020-01-19 DIAGNOSIS — Z20822 Contact with and (suspected) exposure to covid-19: Secondary | ICD-10-CM | POA: Diagnosis not present

## 2020-01-19 DIAGNOSIS — E785 Hyperlipidemia, unspecified: Secondary | ICD-10-CM | POA: Diagnosis present

## 2020-01-19 DIAGNOSIS — I6523 Occlusion and stenosis of bilateral carotid arteries: Secondary | ICD-10-CM | POA: Insufficient documentation

## 2020-01-19 DIAGNOSIS — Y832 Surgical operation with anastomosis, bypass or graft as the cause of abnormal reaction of the patient, or of later complication, without mention of misadventure at the time of the procedure: Secondary | ICD-10-CM | POA: Insufficient documentation

## 2020-01-19 DIAGNOSIS — I714 Abdominal aortic aneurysm, without rupture: Secondary | ICD-10-CM | POA: Insufficient documentation

## 2020-01-19 DIAGNOSIS — I480 Paroxysmal atrial fibrillation: Secondary | ICD-10-CM | POA: Diagnosis not present

## 2020-01-19 DIAGNOSIS — I25710 Atherosclerosis of autologous vein coronary artery bypass graft(s) with unstable angina pectoris: Secondary | ICD-10-CM | POA: Insufficient documentation

## 2020-01-19 DIAGNOSIS — K219 Gastro-esophageal reflux disease without esophagitis: Secondary | ICD-10-CM | POA: Diagnosis not present

## 2020-01-19 DIAGNOSIS — I25110 Atherosclerotic heart disease of native coronary artery with unstable angina pectoris: Secondary | ICD-10-CM | POA: Diagnosis not present

## 2020-01-19 DIAGNOSIS — I5032 Chronic diastolic (congestive) heart failure: Secondary | ICD-10-CM | POA: Diagnosis not present

## 2020-01-19 DIAGNOSIS — Z681 Body mass index (BMI) 19 or less, adult: Secondary | ICD-10-CM | POA: Insufficient documentation

## 2020-01-19 DIAGNOSIS — D631 Anemia in chronic kidney disease: Secondary | ICD-10-CM | POA: Insufficient documentation

## 2020-01-19 DIAGNOSIS — Z992 Dependence on renal dialysis: Secondary | ICD-10-CM | POA: Insufficient documentation

## 2020-01-19 DIAGNOSIS — N186 End stage renal disease: Secondary | ICD-10-CM

## 2020-01-19 DIAGNOSIS — R531 Weakness: Secondary | ICD-10-CM | POA: Insufficient documentation

## 2020-01-19 DIAGNOSIS — E1122 Type 2 diabetes mellitus with diabetic chronic kidney disease: Secondary | ICD-10-CM | POA: Diagnosis not present

## 2020-01-19 DIAGNOSIS — E43 Unspecified severe protein-calorie malnutrition: Secondary | ICD-10-CM | POA: Insufficient documentation

## 2020-01-19 DIAGNOSIS — E1151 Type 2 diabetes mellitus with diabetic peripheral angiopathy without gangrene: Secondary | ICD-10-CM | POA: Insufficient documentation

## 2020-01-19 DIAGNOSIS — K861 Other chronic pancreatitis: Secondary | ICD-10-CM | POA: Insufficient documentation

## 2020-01-19 DIAGNOSIS — J449 Chronic obstructive pulmonary disease, unspecified: Secondary | ICD-10-CM | POA: Diagnosis not present

## 2020-01-19 DIAGNOSIS — E8809 Other disorders of plasma-protein metabolism, not elsewhere classified: Secondary | ICD-10-CM | POA: Diagnosis not present

## 2020-01-19 DIAGNOSIS — Z9981 Dependence on supplemental oxygen: Secondary | ICD-10-CM | POA: Insufficient documentation

## 2020-01-19 DIAGNOSIS — I252 Old myocardial infarction: Secondary | ICD-10-CM | POA: Insufficient documentation

## 2020-01-19 DIAGNOSIS — W5501XD Bitten by cat, subsequent encounter: Secondary | ICD-10-CM | POA: Diagnosis not present

## 2020-01-19 DIAGNOSIS — Z951 Presence of aortocoronary bypass graft: Secondary | ICD-10-CM | POA: Insufficient documentation

## 2020-01-19 DIAGNOSIS — G894 Chronic pain syndrome: Secondary | ICD-10-CM | POA: Insufficient documentation

## 2020-01-19 DIAGNOSIS — I708 Atherosclerosis of other arteries: Secondary | ICD-10-CM | POA: Insufficient documentation

## 2020-01-19 DIAGNOSIS — Z515 Encounter for palliative care: Secondary | ICD-10-CM

## 2020-01-19 DIAGNOSIS — E119 Type 2 diabetes mellitus without complications: Secondary | ICD-10-CM

## 2020-01-19 DIAGNOSIS — Z955 Presence of coronary angioplasty implant and graft: Secondary | ICD-10-CM | POA: Insufficient documentation

## 2020-01-19 DIAGNOSIS — F329 Major depressive disorder, single episode, unspecified: Secondary | ICD-10-CM | POA: Insufficient documentation

## 2020-01-19 DIAGNOSIS — T82868A Thrombosis of vascular prosthetic devices, implants and grafts, initial encounter: Secondary | ICD-10-CM | POA: Diagnosis present

## 2020-01-19 DIAGNOSIS — I10 Essential (primary) hypertension: Secondary | ICD-10-CM | POA: Diagnosis present

## 2020-01-19 DIAGNOSIS — K859 Acute pancreatitis without necrosis or infection, unspecified: Secondary | ICD-10-CM | POA: Diagnosis present

## 2020-01-19 DIAGNOSIS — G4733 Obstructive sleep apnea (adult) (pediatric): Secondary | ICD-10-CM | POA: Diagnosis present

## 2020-01-19 DIAGNOSIS — Z79899 Other long term (current) drug therapy: Secondary | ICD-10-CM | POA: Insufficient documentation

## 2020-01-19 DIAGNOSIS — Z7901 Long term (current) use of anticoagulants: Secondary | ICD-10-CM | POA: Insufficient documentation

## 2020-01-19 DIAGNOSIS — S71151D Open bite, right thigh, subsequent encounter: Secondary | ICD-10-CM | POA: Insufficient documentation

## 2020-01-19 DIAGNOSIS — I739 Peripheral vascular disease, unspecified: Secondary | ICD-10-CM | POA: Diagnosis present

## 2020-01-19 DIAGNOSIS — J9611 Chronic respiratory failure with hypoxia: Secondary | ICD-10-CM

## 2020-01-19 DIAGNOSIS — J9610 Chronic respiratory failure, unspecified whether with hypoxia or hypercapnia: Secondary | ICD-10-CM | POA: Insufficient documentation

## 2020-01-19 DIAGNOSIS — F17200 Nicotine dependence, unspecified, uncomplicated: Secondary | ICD-10-CM | POA: Diagnosis present

## 2020-01-19 DIAGNOSIS — I2510 Atherosclerotic heart disease of native coronary artery without angina pectoris: Secondary | ICD-10-CM | POA: Diagnosis present

## 2020-01-19 DIAGNOSIS — I132 Hypertensive heart and chronic kidney disease with heart failure and with stage 5 chronic kidney disease, or end stage renal disease: Secondary | ICD-10-CM | POA: Insufficient documentation

## 2020-01-19 LAB — COMPREHENSIVE METABOLIC PANEL
ALT: 28 U/L (ref 0–44)
AST: 43 U/L — ABNORMAL HIGH (ref 15–41)
Albumin: 2 g/dL — ABNORMAL LOW (ref 3.5–5.0)
Alkaline Phosphatase: 192 U/L — ABNORMAL HIGH (ref 38–126)
Anion gap: 10 (ref 5–15)
BUN: 27 mg/dL — ABNORMAL HIGH (ref 8–23)
CO2: 25 mmol/L (ref 22–32)
Calcium: 7.9 mg/dL — ABNORMAL LOW (ref 8.9–10.3)
Chloride: 103 mmol/L (ref 98–111)
Creatinine, Ser: 2.39 mg/dL — ABNORMAL HIGH (ref 0.44–1.00)
GFR calc Af Amer: 24 mL/min — ABNORMAL LOW (ref >60)
GFR calc non Af Amer: 20 mL/min — ABNORMAL LOW (ref >60)
Glucose, Bld: 129 mg/dL — ABNORMAL HIGH (ref 70–99)
Potassium: 3.2 mmol/L — ABNORMAL LOW (ref 3.5–5.1)
Sodium: 138 mmol/L (ref 135–145)
Total Bilirubin: 0.9 mg/dL (ref 0.3–1.2)
Total Protein: 4.5 g/dL — ABNORMAL LOW (ref 6.5–8.1)

## 2020-01-19 LAB — CBC WITH DIFFERENTIAL/PLATELET
Abs Immature Granulocytes: 0.03 10*3/uL (ref 0.00–0.07)
Basophils Absolute: 0.1 10*3/uL (ref 0.0–0.1)
Basophils Relative: 1 %
Eosinophils Absolute: 0.1 10*3/uL (ref 0.0–0.5)
Eosinophils Relative: 2 %
HCT: 29 % — ABNORMAL LOW (ref 36.0–46.0)
Hemoglobin: 9.6 g/dL — ABNORMAL LOW (ref 12.0–15.0)
Immature Granulocytes: 0 %
Lymphocytes Relative: 14 %
Lymphs Abs: 0.9 10*3/uL (ref 0.7–4.0)
MCH: 33.8 pg (ref 26.0–34.0)
MCHC: 33.1 g/dL (ref 30.0–36.0)
MCV: 102.1 fL — ABNORMAL HIGH (ref 80.0–100.0)
Monocytes Absolute: 0.5 10*3/uL (ref 0.1–1.0)
Monocytes Relative: 8 %
Neutro Abs: 5.1 10*3/uL (ref 1.7–7.7)
Neutrophils Relative %: 75 %
Platelets: 158 10*3/uL (ref 150–400)
RBC: 2.84 MIL/uL — ABNORMAL LOW (ref 3.87–5.11)
RDW: 17.6 % — ABNORMAL HIGH (ref 11.5–15.5)
WBC: 6.8 10*3/uL (ref 4.0–10.5)
nRBC: 0 % (ref 0.0–0.2)

## 2020-01-19 LAB — SARS CORONAVIRUS 2 BY RT PCR (HOSPITAL ORDER, PERFORMED IN ~~LOC~~ HOSPITAL LAB): SARS Coronavirus 2: NEGATIVE

## 2020-01-19 LAB — TROPONIN I (HIGH SENSITIVITY): Troponin I (High Sensitivity): 22 ng/L — ABNORMAL HIGH (ref <18)

## 2020-01-19 MED ORDER — HYDROMORPHONE HCL 1 MG/ML IJ SOLN
1.0000 mg | Freq: Once | INTRAMUSCULAR | Status: AC
  Administered 2020-01-19: 1 mg via INTRAVENOUS
  Filled 2020-01-19: qty 1

## 2020-01-19 NOTE — ED Triage Notes (Signed)
Pt comes POV with weakness and unable to access fistula for dialysis today. Pt usually a tues/thurs/sat pt. Reports that last time she felt like this she needed a blood transfusion. Pt also reports puffy eyes today.

## 2020-01-19 NOTE — ED Notes (Signed)
Pt states her dialysis fistula in left arm has clotted and could not be accessed today.  Pt reports new puffiness around her eyes.  Edema is noted over right eye.  Additionally she reports chronic pancreatic pain currently 8/10.

## 2020-01-19 NOTE — H&P (Addendum)
PCP:   Bernerd Limbo, MD   Chief Complaint: Blocked AV fistula   HPI: This is a 69 year old female with history of end-stage renal disease on hemodialysis.  She presents today after she was unable to be dialyzed today due to a clotted AV fistula.  She was sent to the ER.  The patient has several complaints that are relatively chronic.  The patient has chronic pancreatitis and has chronic pain.  She has chronic intermittent nausea.  With this she has a poor appetite and chronic diarrhea for the last 6 months.  She is prescribed Creon but she is unable to afford therefore that she has never taken.  She states she has not had diarrhea for last 2 to 3 days.  She has not seen GI for this.  Her last EGD was 11/2018 which revealed mild erythema of the gastric antrum without ulcers.  She was placed on Protonix daily.  Continue weight loss and is often orthostatic with lightheaded and dizziness.  Today her blood pressure 73/49 when she went to dialysis.  She does make some urine.  Denies any burning urination or foul-smelling urine.  She denies any shortness of breath or fevers or chills.  She has chronic weakness but this been significantly currently worse over the past month or more.  Review of Systems:  The patient reports anorexia, fever, weakness, weight loss,, vision loss, decreased hearing, hoarseness, chest pain, syncope, dyspnea on exertion, peripheral edema, balance deficits, hemoptysis, abdominal pain, melena, hematochezia, severe indigestion/heartburn, hematuria, incontinence, genital sores, muscle weakness, suspicious skin lesions, transient blindness, difficulty walking, depression, unusual weight change, abnormal bleeding, enlarged lymph nodes, angioedema, and breast masses.  Past Medical History: Past Medical History:  Diagnosis Date  . AAA (abdominal aortic aneurysm) ([place]) 5/09   3.7x3.3 by u/s 2009  . Abnormal EKG    deep TW inversions chronic  . Anemia   . CAD (coronary artery  disease)    a. CABG 03/2011 LIMA to LAD, SVG to OM, SVG to PDA. b. cath 06/25/2015 DES to SVG to rPDA, patent LIMA to LAD, patent SVG to OM  . carotid stenosis 5/09   S/p L CEA ;  60-79% bilat ICA by preCABG dopplers 7/12  . CHF (congestive heart failure) ([place])   . Chronic back pain    "all over" (06/24/2015)  . Chronic bronchitis ([place])    "get it pretty much q yr" (06/24/2015)  . CKD (chronic kidney disease)   . Complication of anesthesia 1966   "problem w/ether"  . COPD (chronic obstructive pulmonary disease) ([place])   . Depression   . Dysrhythmia   . GERD (gastroesophageal reflux disease)    With hiatal hernia  . GIB (gastrointestinal bleeding) 9/11   S/p EGD with cautery at [facility]  . Heart murmur   . History of blood transfusion "a few times"   "all related to anemia" (06/24/2015)  . History of hiatal hernia   . Hyperlipidemia   . Hypertension   . Mitral regurgitation    3+ MR by intraoperative TEE;  s/p MV repair with Dr. Roxy Manns 7/12  . Myocardial infarction [facility]) 2012 "several"  . On home oxygen therapy    "2 liters at night; negative for sleep apnea"  . PAD (peripheral artery disease) (HCC)    Severe; s/p bilateral renal artery stents, moderate in-stent restenosis  . Pancreatitis 05/2018  . Paroxysmal atrial fibrillation (HCC)    coumadin;  echo 9/07: EF 60%, mild LVH;  s/p Cox Maze 7/12 with  LAA clipping  . Subclavian artery stenosis, left (HCC)    stented by Dr. Trula Slade on 10/18 to help flow of her LIMA to LAD  . Type II diabetes mellitus ([place])    Past Surgical History:  Procedure Laterality Date  . A/V FISTULAGRAM Left 03/21/2018   Procedure: A/V FISTULAGRAM;  Surgeon: Serafina Mitchell, MD;  Location: [place] [facility];  Service: Cardiovascular;  Laterality: Left;  . ABDOMINAL AORTIC ENDOVASCULAR STENT GRAFT N/A 01/17/2018   Procedure: ABDOMINAL AORTIC ENDOVASCULAR STENT GRAFT WITH CO2;  Surgeon: Serafina Mitchell, MD;  Location: [place];  Service: Vascular;  Laterality: N/A;   . ABDOMINAL HYSTERECTOMY  1990's  . APPENDECTOMY  Aug. 11, 2016   Ruptured  . AV FISTULA PLACEMENT Left 11/03/2017   Procedure: ARTERIOVENOUS (AV) FISTULA CREATION LEFT ARM;  Surgeon: Serafina Mitchell, MD;  Location:  [place];  Service: Vascular;  Laterality: Left;  . BOWEL RESECTION  2013  . CARDIAC CATHETERIZATION N/A 06/24/2015   Procedure: Left Heart Cath and Cors/Grafts Angiography;  Surgeon: Leonie Man, MD;  Location: [place] [facility];  Service: Cardiovascular;  Laterality: N/A;  . CARDIAC CATHETERIZATION N/A 06/25/2015   Procedure: Coronary Stent Intervention;  Surgeon: Sherren Mocha, MD;  Location: [place] [facility];  Service: Cardiovascular;  Laterality: N/A;  . CAROTID ENDARTERECTOMY Left   . CATARACT EXTRACTION Right 03/27/2018  . CHOLECYSTECTOMY N/A 04/19/2018   Procedure: LAPAROSCOPIC CHOLECYSTECTOMY WITH INTRAOPERATIVE CHOLANGIOGRAM;  Surgeon: Rolm Bookbinder, MD;  Location: [place];  Service: General;  Laterality: N/A;  . CORONARY ANGIOPLASTY    . CORONARY ARTERY BYPASS GRAFT  03/05/2011   CABG X3 (LIMA to LAD, SVG to OM, SVG to PDA, EVH via left thigh  . CORONARY STENT PLACEMENT    . ESOPHAGOGASTRODUODENOSCOPY N/A 04/12/2018   Procedure: ESOPHAGOGASTRODUODENOSCOPY (EGD);  Surgeon: [name], MD;  [facility];  Service: Endoscopy;  Laterality: N/A;  . ESOPHAGOGASTRODUODENOSCOPY N/A 06/16/2018   Procedure: ESOPHAGOGASTRODUODENOSCOPY (EGD);  Surgeon: Thornton Park, MD;  Location: [place];  Service: Gastroenterology;  Laterality: N/A;  . IR RADIOLOGIST EVAL & MGMT  09/13/2019  . IR RADIOLOGIST EVAL & MGMT  10/31/2019  . IR RADIOLOGIST EVAL & MGMT  11/14/2019  . IR RADIOLOGIST EVAL & MGMT  01/02/2020  . LACERATION REPAIR Right 1990's   WRIST  . MAZE  03/05/2011   complete biatrial lesion set with clipping of LA appendage  . MITRAL VALVE REPAIR  03/05/2011   81mm Memo 3D ring annuloplasty for ischemic MR  . PERIPHERAL VASCULAR BALLOON ANGIOPLASTY Left  03/21/2018   Procedure: PERIPHERAL VASCULAR BALLOON ANGIOPLASTY;  Surgeon: Serafina Mitchell, MD;  Location: [place] [facility];  Service: Cardiovascular;  Laterality: Left;  . PERIPHERAL VASCULAR CATHETERIZATION N/A 06/24/2015   Procedure: Aortic Arch Angiography;  Surgeon: Serafina Mitchell, MD;  Location: [place] [facility];  Service: Cardiovascular;  Laterality: N/A;  . PERIPHERAL VASCULAR CATHETERIZATION  06/24/2015   Procedure: Peripheral Vascular Intervention;  Surgeon: Serafina Mitchell, MD;  Location: [place] [facility];  Service: Cardiovascular;;  . PERIPHERAL VASCULAR CATHETERIZATION N/A 06/24/2015   Procedure: Abdominal Aortogram;  Surgeon: Serafina Mitchell, MD;  Location: [place] [facility];  Service: Cardiovascular;  Laterality: N/A;  . RENAL ANGIOGRAM Bilateral 09/11/2013   Procedure: RENAL ANGIOGRAM;  Surgeon: Serafina Mitchell, MD;  Location: [facility] CATH LAB;  Service: Cardiovascular;  Laterality: Bilateral;  . RIGHT FEMORAL-POPLITEAL BYPASS      Medications: Prior to Admission medications   Medication Sig  Start Date End Date Taking? Authorizing Provider  Albuterol Sulfate (PROAIR RESPICLICK) 284 (90 Base) MCG/ACT AEPB Inhale 1 puff into the lungs every 4 (four) hours as needed (shortness of breath/wheezing).    [provider]  allopurinol (ZYLOPRIM) 100 MG tablet Take 100 mg by mouth daily. 02/02/17   [provider]  amLODipine (NORVASC) 5 MG tablet Take 1 tablet (5 mg total) by mouth daily. Patient taking differently: Take 10 mg by mouth daily.  08/23/18   Mikhail, Velta Addison, DO  atorvastatin (LIPITOR) 40 MG tablet Take 40 mg by mouth daily.  12/07/17   [provider]  B Complex-C-Zn-Folic Acid (DIALYVITE 132-GMWN 15) 0.8 MG TABS Take 1 tablet by mouth daily. 08/09/19   [provider]  carvedilol (COREG) 25 MG tablet Take 25 mg by mouth 2 (two) times daily. 04/16/19   [provider]  clopidogrel (PLAVIX) 75 MG tablet Take 75 mg by mouth daily.  10/02/19   [provider]  CREON 12000 units CPEP capsule Take 2 capsules (24,000 Units total) by mouth 3 (three) times daily before meals. 05/18/19   Regalado, Belkys A, MD  diphenhydrAMINE (BENADRYL) 25 MG tablet Take 50 mg by mouth at bedtime.    [provider]  gabapentin (NEURONTIN) 300 MG capsule Take 1 capsule (300 mg total) by mouth at bedtime. 08/22/18   Mikhail, Velta Addison, DO  hydrALAZINE (APRESOLINE) 25 MG tablet Take 1 tablet (25 mg total) by mouth every 8 (eight) hours. 06/26/18   Thurnell Lose, MD  HYDROmorphone (DILAUDID) 2 MG tablet Take 2-4 mg by mouth every 4 (four) hours.     [provider]  OXYGEN Inhale 2 L into the lungs at bedtime.     [provider]  pantoprazole (PROTONIX) 40 MG tablet Take 1 tablet (40 mg total) by mouth daily. 08/03/18   Patrecia Pour, MD  promethazine (PHENERGAN) 12.5 MG tablet Take 12.5 mg by mouth every 4 (four) hours as needed for nausea or vomiting.    [provider]  traZODone (DESYREL) 100 MG tablet Take 150 mg by mouth at bedtime.  04/19/19   [provider]    Allergies:   Allergies  Allergen Reactions  . Isosorbide Other (See Comments)    Can only tolerate in low doses (unknown reaction) Other reaction(s): Other Unknown "FELT LIKE I WAS GOING TO EXPLODE FROM INSIDE OUT"  . Codeine Itching  . Contrast Media [Iodinated Diagnostic Agents] Itching and Other (See Comments)    Itching of feet  . Ioxaglate Itching and Other (See Comments)    Itching of feet  . Isosorbide Nitrate Other (See Comments)    Unknown   . Metrizamide Itching and Other (See Comments)    Itching of feet  . Oxycodone-Acetaminophen Itching  . Percocet [Oxycodone-Acetaminophen] Itching and Other (See Comments)    Tolerates Hydrocodone    Social History:  reports that she has been smoking cigarettes. She has a 37.50 pack-year smoking history. She has never used smokeless tobacco. She reports current alcohol  use. She reports current drug use. Drug: Marijuana.  Family History: Family History  Problem Relation Age of Onset  . Emphysema Mother   . Heart disease Mother        before age 59  . Hypertension Mother   . Hyperlipidemia Mother   . Heart attack Mother   . AAA (abdominal aortic aneurysm) Mother        rupture  . Heart attack Father 19  . Emphysema  Father   . Heart disease Father        before age 69  . Hyperlipidemia Father   . Hypertension Father   . Peripheral vascular disease Father   . Diabetes Brother   . Heart disease Brother        before age 19  . Hyperlipidemia Brother   . Hypertension Brother   . Heart attack Brother        CABG  . Heart disease Other        Vascular disease in grandparents, uncles and dad  . Colon cancer Neg Hx   . Esophageal cancer Neg Hx   . Inflammatory bowel disease Neg Hx   . Liver disease Neg Hx   . Pancreatic cancer Neg Hx   . Rectal cancer Neg Hx   . Stomach cancer Neg Hx     Physical Exam: Vitals:   01/19/20 2013 01/19/20 2015 01/19/20 2030 01/19/20 2100  BP: (!) 111/52  (!) 114/49 128/62  Pulse: 65  63 60  Resp: 18  11 13   Temp: 98.1 F (36.7 C)     TempSrc: Oral     SpO2: 97%  96% 98%  Weight:  43.1 kg    Height:  5\' 5"  (1.651 m)      General:  Alert and oriented times three, well developed and nourished, no acute distress Eyes: PERRLA, pink conjunctiva, no scleral icterus ENT: Moist oral mucosa, neck supple, no thyromegaly, edema around right eye Lungs: clear to ascultation, no wheeze, no crackles, no use of accessory muscles Cardiovascular: regular rate and rhythm, no regurgitation, no gallops, no murmurs. No carotid bruits, no JVD Abdomen: soft, positive BS, non-tender, non-distended, no organomegaly, not an acute abdomen GU: not examined Neuro: CN II - XII grossly intact, sensation intact Musculoskeletal: strength 5/5 all extremities, no clubbing, cyanosis or edema, AV fistula without bruit right upper  extremity Skin: no rash, no subcutaneous crepitation, no decubitus, healing scar right lower extremity Psych: appropriate patient   Labs on Admission:  Recent Labs    01/19/20 2020  NA 138  K 3.2*  CL 103  CO2 25  GLUCOSE 129*  BUN 27*  CREATININE 2.39*  CALCIUM 7.9*   Recent Labs    01/19/20 2020  AST 43*  ALT 28  ALKPHOS 192*  BILITOT 0.9  PROT 4.5*  ALBUMIN 2.0*   No results for input(s): LIPASE, AMYLASE in the last 72 hours. Recent Labs    01/19/20 2020  WBC 6.8  NEUTROABS 5.1  HGB 9.6*  HCT 29.0*  MCV 102.1*  PLT 158   No results for input(s): CKTOTAL, CKMB, CKMBINDEX, TROPONINI in the last 72 hours. Invalid input(s): POCBNP No results for input(s): DDIMER in the last 72 hours. No results for input(s): HGBA1C in the last 72 hours. No results for input(s): CHOL, HDL, LDLCALC, TRIG, CHOLHDL, LDLDIRECT in the last 72 hours. No results for input(s): TSH, T4TOTAL, T3FREE, THYROIDAB in the last 72 hours.  Invalid input(s): FREET3 No results for input(s): VITAMINB12, FOLATE, FERRITIN, TIBC, IRON, RETICCTPCT in the last 72 hours.  Micro Results: No results found for this or any previous visit (from the past 240 hour(s)).   Radiological Exams on Admission: DG Chest 1 View  Result Date: 01/19/2020 CLINICAL DATA:  Weakness EXAM: CHEST  1 VIEW COMPARISON:  08/23/2019 FINDINGS: Post sternotomy changes with valve prosthesis and appendage clip. No focal opacity or pleural effusion. Stable cardiomediastinal silhouette with aortic atherosclerosis. No pneumothorax. Left axillary vascular stent.  IMPRESSION: No active disease. Electronically Signed   By: Donavan Foil M.D.   On: 01/19/2020 20:33    Assessment/Plan Present on Admission: AV fistula thrombosis, initial encounter End-stage renal disease, hemodialysis -Bring in for overnight observation on MedSurg -Nephrologist Dr.Kollulu aware and consulted -Consult placed for vascular in the a.m. -Hemodialysis  Thursday/Tuesday/Saturday  . Essential hypertension  -Orthostatic vitals in a.m. and now. -Small bolus to 250 cc x1 -Blood pressure medications resumed with hold parameters, nephrology to review  . History of recurrent pancreatitis//hypoalbuminemia -Resume Creon. -C. difficile and stool cultures ordered but doubt infection -Lipase level in a.m.  Marland Kitchen Protein-calorie malnutrition, severe/hypoalbuminemia Weakness/weight loss -Nutrition consult placed -PT consult placed  Recent cat bite/scar right inner thigh -Patient completed antibiotics -Nursing to change dressing  . Chronic diastolic heart failure (HCC) Chronic respiratory failure -Stable, home meds resumed, including oxygen  . CAD (coronary artery disease) -Stable, home meds resumed  . COPD with chronic bronchitis ([place]) -Stable, home meds resumed  . Coronary artery disease involving autologous vein coronary bypass graft with unstable angina pectoris ([place]) -Stable, home meds resumed  . Hyperlipidemia -Stable, home meds resumed  Demaurion Dicioccio 01/19/2020, 9:22 PM

## 2020-01-19 NOTE — ED Provider Notes (Signed)
[facility] Provider Note       Time seen: ----------------------------------------- 8:12 PM on 01/19/2020 -----------------------------------------  I have reviewed the vital signs and the nursing notes.  HISTORY   Chief Complaint Vascular Access Problem and Weakness    HPI [name] is a 69 y.o. female with a history of AAA, anemia, CHF, end-stage renal disease on dialysis Tuesday, Thursday and Saturday, COPD, GERD, hypertension, hyperlipidemia, MI, who presents today for weakness.  Patient states over the last week she has had profound weakness.  Also cannot be dialyzed today because her AV fistula was not working where they had a clot.  She denies fever, chills, chest pain, shortness of breath, vomiting or diarrhea  Past Medical History:  Diagnosis Date  . AAA (abdominal aortic aneurysm) ([place]) 5/09   3.7x3.3 by u/s 2009  . Abnormal EKG    deep TW inversions chronic  . Anemia   . CAD (coronary artery disease)    a. CABG 03/2011 LIMA to LAD, SVG to OM, SVG to PDA. b. cath 06/25/2015 DES to SVG to rPDA, patent LIMA to LAD, patent SVG to OM  . carotid stenosis 5/09   S/p L CEA ;  60-79% bilat ICA by preCABG dopplers 7/12  . CHF (congestive heart failure) ([place])   . Chronic back pain    "all over" (06/24/2015)  . Chronic bronchitis ([place])    "get it pretty much q yr" (06/24/2015)  . CKD (chronic kidney disease)   . Complication of anesthesia 1966   "problem w/ether"  . COPD (chronic obstructive pulmonary disease) ([place])   . Depression   . Dysrhythmia   . GERD (gastroesophageal reflux disease)    With hiatal hernia  . GIB (gastrointestinal bleeding) 9/11   S/p EGD with cautery at [facility]  . Heart murmur   . History of blood transfusion "a few times"   "all related to anemia" (06/24/2015)  . History of hiatal hernia   . Hyperlipidemia   . Hypertension   . Mitral regurgitation    3+ MR by intraoperative TEE;  s/p MV repair with Dr. Roxy Manns 7/12   . Myocardial infarction [facility]) 2012 "several"  . On home oxygen therapy    "2 liters at night; negative for sleep apnea"  . PAD (peripheral artery disease) (HCC)    Severe; s/p bilateral renal artery stents, moderate in-stent restenosis  . Pancreatitis 05/2018  . Paroxysmal atrial fibrillation (HCC)    coumadin;  echo 9/07: EF 60%, mild LVH;  s/p Cox Maze 7/12 with LAA clipping  . Subclavian artery stenosis, left (HCC)    stented by Dr. Trula Slade on 10/18 to help flow of her LIMA to LAD  . Type II diabetes mellitus ([place])     Past Surgical History:  Procedure Laterality Date  . A/V FISTULAGRAM Left 03/21/2018   Procedure: A/V FISTULAGRAM;  Surgeon: Serafina Mitchell, MD;  Location: [place] [facility];  Service: Cardiovascular;  Laterality: Left;  . ABDOMINAL AORTIC ENDOVASCULAR STENT GRAFT N/A 01/17/2018   Procedure: ABDOMINAL AORTIC ENDOVASCULAR STENT GRAFT WITH CO2;  Surgeon: Serafina Mitchell, MD;  Location: [place];  Service: Vascular;  Laterality: N/A;  . ABDOMINAL HYSTERECTOMY  1990's  . APPENDECTOMY  Aug. 11, 2016   Ruptured  . AV FISTULA PLACEMENT Left 11/03/2017   Procedure: ARTERIOVENOUS (AV) FISTULA CREATION LEFT ARM;  Surgeon: Serafina Mitchell, MD;  Location: [place];  Service: Vascular;  Laterality: Left;  . BOWEL RESECTION  2013  .  CARDIAC CATHETERIZATION N/A 06/24/2015   Procedure: Left Heart Cath and Cors/Grafts Angiography;  Surgeon: Leonie Man, MD;  Location: [place] [facility];  Service: Cardiovascular;  Laterality: N/A;  . CARDIAC CATHETERIZATION N/A 06/25/2015   Procedure: Coronary Stent Intervention;  Surgeon: Sherren Mocha, MD;  Location: [place] [facility];  Service: Cardiovascular;  Laterality: N/A;  . CAROTID ENDARTERECTOMY Left   . CATARACT EXTRACTION Right 03/27/2018  . CHOLECYSTECTOMY N/A 04/19/2018   Procedure: LAPAROSCOPIC CHOLECYSTECTOMY WITH INTRAOPERATIVE CHOLANGIOGRAM;  Surgeon: Rolm Bookbinder, MD;  Location: [place];  Service: General;  Laterality: N/A;   . CORONARY ANGIOPLASTY    . CORONARY ARTERY BYPASS GRAFT  03/05/2011   CABG X3 (LIMA to LAD, SVG to OM, SVG to PDA, EVH via left thigh  . CORONARY STENT PLACEMENT    . ESOPHAGOGASTRODUODENOSCOPY N/A 04/12/2018   Procedure: ESOPHAGOGASTRODUODENOSCOPY (EGD);  Surgeon: [name], MD;  Location: [facility] ENDOSCOPY;  Service: Endoscopy;  Laterality: N/A;  . ESOPHAGOGASTRODUODENOSCOPY N/A 06/16/2018   Procedure: ESOPHAGOGASTRODUODENOSCOPY (EGD);  Surgeon: Thornton Park, MD;  Location: [place];  Service: Gastroenterology;  Laterality: N/A;  . IR RADIOLOGIST EVAL & MGMT  09/13/2019  . IR RADIOLOGIST EVAL & MGMT  10/31/2019  . IR RADIOLOGIST EVAL & MGMT  11/14/2019  . IR RADIOLOGIST EVAL & MGMT  01/02/2020  . LACERATION REPAIR Right 1990's   WRIST  . MAZE  03/05/2011   complete biatrial lesion set with clipping of LA appendage  . MITRAL VALVE REPAIR  03/05/2011   32mm Memo 3D ring annuloplasty for ischemic MR  . PERIPHERAL VASCULAR BALLOON ANGIOPLASTY Left 03/21/2018   Procedure: PERIPHERAL VASCULAR BALLOON ANGIOPLASTY;  Surgeon: Serafina Mitchell, MD;  Location: [place] [facility];  Service: Cardiovascular;  Laterality: Left;  . PERIPHERAL VASCULAR CATHETERIZATION N/A 06/24/2015   Procedure: Aortic Arch Angiography;  Surgeon: Serafina Mitchell, MD;  Location: [place] [facility];  Service: Cardiovascular;  Laterality: N/A;  . PERIPHERAL VASCULAR CATHETERIZATION  06/24/2015   Procedure: Peripheral Vascular Intervention;  Surgeon: Serafina Mitchell, MD;  Location: [place] [facility];  Service: Cardiovascular;;  . PERIPHERAL VASCULAR CATHETERIZATION N/A 06/24/2015   Procedure: Abdominal Aortogram;  Surgeon: Serafina Mitchell, MD;  Location: [place] [facility];  Service: Cardiovascular;  Laterality: N/A;  . RENAL ANGIOGRAM Bilateral 09/11/2013   Procedure: RENAL ANGIOGRAM;  Surgeon: Serafina Mitchell, MD;  Location: [facility] CATH LAB;  Service: Cardiovascular;  Laterality: Bilateral;  . RIGHT FEMORAL-POPLITEAL BYPASS       Allergies Isosorbide, Codeine, Contrast media [iodinated diagnostic agents], Ioxaglate, Isosorbide nitrate, Metrizamide, Oxycodone-acetaminophen, and Percocet [oxycodone-acetaminophen]   Review of Systems Constitutional: Negative for fever. Cardiovascular: Negative for chest pain. Respiratory: Negative for shortness of breath. Gastrointestinal: Negative for abdominal pain, vomiting and diarrhea. Musculoskeletal: Negative for back pain. Skin: Negative for rash. Neurological: Positive for weakness  All systems negative/normal/unremarkable except as stated in the HPI  ____________________________________________   PHYSICAL EXAM:  VITAL SIGNS: ED Triage Vitals  Enc Vitals Group     BP      Pulse      Resp      Temp      Temp src      SpO2      Weight      Height      Head Circumference      Peak Flow      Pain Score      Pain Loc      Pain Edu?      Excl.  in [place]?      Constitutional: Alert and oriented.  Chronically ill-appearing, no distress Eyes: Conjunctivae are normal. Normal extraocular movements. Cardiovascular: Normal rate, regular rhythm. No murmurs, rubs, or gallops. Respiratory: Normal respiratory effort without tachypnea nor retractions. Breath sounds are clear and equal bilaterally. No wheezes/rales/rhonchi. Gastrointestinal: Soft and nontender. Normal bowel sounds Musculoskeletal: Nontender with normal range of motion in extremities. No lower extremity tenderness nor edema. Neurologic:  Normal speech and language. No gross focal neurologic deficits are appreciated.  Skin:  Skin is warm, dry and intact. No rash noted. Psychiatric: Speech and behavior are normal.  ____________________________________________  EKG: Interpreted by me.  Sinus rhythm, PVCs, right bundle branch block, T wave abnormality  ____________________________________________   LABS (pertinent positives/negatives)  Labs Reviewed  CBC WITH DIFFERENTIAL/PLATELET - Abnormal;  Notable for the following components:      Result Value   RBC 2.84 (*)    Hemoglobin 9.6 (*)    HCT 29.0 (*)    MCV 102.1 (*)    RDW 17.6 (*)    All other components within normal limits  COMPREHENSIVE METABOLIC PANEL - Abnormal; Notable for the following components:   Potassium 3.2 (*)    Glucose, Bld 129 (*)    BUN 27 (*)    Creatinine, Ser 2.39 (*)    Calcium 7.9 (*)    Total Protein 4.5 (*)    Albumin 2.0 (*)    AST 43 (*)    Alkaline Phosphatase 192 (*)    GFR calc non Af Amer 20 (*)    GFR calc Af Amer 24 (*)    All other components within normal limits  TROPONIN I (HIGH SENSITIVITY) - Abnormal; Notable for the following components:   Troponin I (High Sensitivity) 22 (*)    All other components within normal limits     RADIOLOGY  Chest x-ray IMPRESSION: No active disease.   DIFFERENTIAL DIAGNOSIS  End-stage renal disease, hyperkalemia, dehydration, sepsis, electrolyte abnormality   ASSESSMENT AND PLAN  End-stage renal disease on dialysis   Plan: The patient had presented for profound weakness and hypotension. Patient's labs do not reflect any acute process.  I have discussed with nephrology, we will observe in the hospital and reevaluate with vascular surgery consultation.  Lenise Arena MD    Note: This note was generated in part or whole with voice recognition software. Voice recognition is usually quite accurate but there are transcription errors that can and very often do occur. I apologize for any typographical errors that were not detected and corrected.     Earleen Newport, MD 01/19/20 2106

## 2020-01-20 MED ORDER — HEPARIN SODIUM (PORCINE) 5000 UNIT/ML IJ SOLN: 5000.0000 [IU] | Freq: Three times a day (TID) | INTRAMUSCULAR | Status: DC

## 2020-01-20 MED ORDER — SODIUM CHLORIDE 0.9 % IV BOLUS: 250.0000 mL | Freq: Once | INTRAVENOUS | Status: DC

## 2020-01-20 MED ORDER — GABAPENTIN 300 MG PO CAPS
300.0000 mg | ORAL_CAPSULE | Freq: Every day | ORAL | Status: DC
  Filled 2020-01-20: qty 1

## 2020-01-20 MED ORDER — HYDRALAZINE HCL 25 MG PO TABS: 25.0000 mg | ORAL_TABLET | Freq: Three times a day (TID) | ORAL | Status: DC

## 2020-01-20 MED ORDER — ATORVASTATIN CALCIUM 20 MG PO TABS: 40.0000 mg | ORAL_TABLET | Freq: Every day | ORAL | Status: DC

## 2020-01-20 MED ORDER — ALLOPURINOL 100 MG PO TABS: 100.0000 mg | ORAL_TABLET | Freq: Every day | ORAL | Status: DC

## 2020-01-20 MED ORDER — PROMETHAZINE HCL 25 MG PO TABS: 12.5000 mg | ORAL_TABLET | Freq: Four times a day (QID) | ORAL | Status: DC | PRN

## 2020-01-20 MED ORDER — DRONABINOL 2.5 MG PO CAPS: 5.0000 mg | ORAL_CAPSULE | Freq: Two times a day (BID) | ORAL | Status: DC

## 2020-01-20 MED ORDER — CARVEDILOL 25 MG PO TABS: 25.0000 mg | ORAL_TABLET | Freq: Two times a day (BID) | ORAL | Status: DC

## 2020-01-20 MED ORDER — DIALYVITE 800-ZINC 15 0.8 MG PO TABS: 1.0000 | ORAL_TABLET | Freq: Every day | ORAL | Status: DC

## 2020-01-20 MED ORDER — HYDROMORPHONE HCL 2 MG PO TABS
2.0000 mg | ORAL_TABLET | ORAL | Status: DC
  Filled 2020-01-20: qty 1

## 2020-01-20 MED ORDER — TRAZODONE HCL 50 MG PO TABS: 150.0000 mg | ORAL_TABLET | Freq: Every day | ORAL | Status: DC

## 2020-01-20 MED ORDER — PANTOPRAZOLE SODIUM 40 MG PO TBEC: 40.0000 mg | DELAYED_RELEASE_TABLET | Freq: Every day | ORAL | Status: DC

## 2020-01-20 MED ORDER — PANCRELIPASE (LIP-PROT-AMYL) 12000-38000 UNITS PO CPEP
24000.0000 [IU] | ORAL_CAPSULE | Freq: Three times a day (TID) | ORAL | Status: DC
  Filled 2020-01-20 (×2): qty 2

## 2020-01-20 NOTE — Progress Notes (Addendum)
     Patient is a 69 year old female with PMH of AAA, CHF, chronic pain syndrome, ESRD on HD TTS, COPD, HTN, HLD, MI, chronic pancreatitis admitted earlier with profound generalized weakness also unable to complete dialysis today due to AV fistula malfunction.    During the course of hospitalization, patient requested that her home Dilaudid be switched to IV every 2 hours.  She takes 2 mg p.o. Dilaudid every 4 hours per her PCP/pain contract, however she states of late she has increased her Dilaudid to 4 mg every 3 hours.  Patient was informed that she would not be able to receive the IV Dilaudid at her request for chronic pain management.  She is adamant that this is what they normally use when she is hospitalized for acute pancreatitis.  I informed her that she is not admitted with acute pancreatitis rather she is here for AV fistula malfunction.  She was offered to increase her Dilaudid to 2 mg every 3 hours p.o. but she declined the offer.  Apparently she was offered the same in the ED but refused to take p.o. Patient insists that her diagnosis be changed to acute pancreatitis so she can be treated with IV Dilaudid.  When she was informed that we cannot change diagnosis she states that she will leave AMA and come back with chief complaints of acute pancreatitis.  Patient at this time expresses desire to leave the Hospital, patient has been warned that this is not Medically advisable at this time, and can result in medical complications. Patient understands and accepts the risks involved and assumes full responsibilty of this decision.     Karen Kays  DNP, APRN, CCRN, FNP-BC on 01/20/2020 at 2:01 AM  [facility]

## 2020-01-20 NOTE — Progress Notes (Signed)
Patient was received from the ED with complaints of chronic pancreatic pain. She was unhappy with the pain med orders written by the admitting MD. I asked Rufina Falco, NP to come speak with patient regarding pain meds. This patient only wanted IV pain medication. Rufina Falco, NP would only give PO medication and discussed this with the patient. After this discussion the patient decided she would leave AMA since she was not able to receive IV pain meds. AMA paperwork was signed. IV was removed. Patient's daughter came to pick patient up. I pushed patient out to car in wheelchair.

## 2020-01-21 ENCOUNTER — Inpatient Hospital Stay (HOSPITAL_COMMUNITY)
Admission: EM | Admit: 2020-01-21 | Discharge: 2020-01-24 | DRG: 252 | Disposition: A | Discharge status: Home Health | Payer: Medicare Other | Attending: Internal Medicine | Admitting: Internal Medicine

## 2020-01-21 ENCOUNTER — Encounter (HOSPITAL_COMMUNITY): Payer: Self-pay | Admitting: Emergency Medicine

## 2020-01-21 ENCOUNTER — Other Ambulatory Visit: Payer: Self-pay

## 2020-01-21 DIAGNOSIS — I2510 Atherosclerotic heart disease of native coronary artery without angina pectoris: Secondary | ICD-10-CM | POA: Diagnosis present

## 2020-01-21 DIAGNOSIS — Z888 Allergy status to other drugs, medicaments and biological substances status: Secondary | ICD-10-CM

## 2020-01-21 DIAGNOSIS — E1151 Type 2 diabetes mellitus with diabetic peripheral angiopathy without gangrene: Secondary | ICD-10-CM | POA: Diagnosis present

## 2020-01-21 DIAGNOSIS — F17210 Nicotine dependence, cigarettes, uncomplicated: Secondary | ICD-10-CM | POA: Diagnosis present

## 2020-01-21 DIAGNOSIS — Z955 Presence of coronary angioplasty implant and graft: Secondary | ICD-10-CM

## 2020-01-21 DIAGNOSIS — K219 Gastro-esophageal reflux disease without esophagitis: Secondary | ICD-10-CM | POA: Diagnosis present

## 2020-01-21 DIAGNOSIS — I132 Hypertensive heart and chronic kidney disease with heart failure and with stage 5 chronic kidney disease, or end stage renal disease: Secondary | ICD-10-CM | POA: Diagnosis present

## 2020-01-21 DIAGNOSIS — G8929 Other chronic pain: Secondary | ICD-10-CM | POA: Diagnosis present

## 2020-01-21 DIAGNOSIS — T82818A Embolism of vascular prosthetic devices, implants and grafts, initial encounter: Secondary | ICD-10-CM | POA: Diagnosis not present

## 2020-01-21 DIAGNOSIS — Z951 Presence of aortocoronary bypass graft: Secondary | ICD-10-CM

## 2020-01-21 DIAGNOSIS — Z9049 Acquired absence of other specified parts of digestive tract: Secondary | ICD-10-CM

## 2020-01-21 DIAGNOSIS — J9611 Chronic respiratory failure with hypoxia: Secondary | ICD-10-CM | POA: Diagnosis present

## 2020-01-21 DIAGNOSIS — I708 Atherosclerosis of other arteries: Secondary | ICD-10-CM | POA: Diagnosis present

## 2020-01-21 DIAGNOSIS — Z7289 Other problems related to lifestyle: Secondary | ICD-10-CM

## 2020-01-21 DIAGNOSIS — K859 Acute pancreatitis without necrosis or infection, unspecified: Secondary | ICD-10-CM | POA: Diagnosis present

## 2020-01-21 DIAGNOSIS — Z95828 Presence of other vascular implants and grafts: Secondary | ICD-10-CM

## 2020-01-21 DIAGNOSIS — J449 Chronic obstructive pulmonary disease, unspecified: Secondary | ICD-10-CM | POA: Diagnosis present

## 2020-01-21 DIAGNOSIS — Z8249 Family history of ischemic heart disease and other diseases of the circulatory system: Secondary | ICD-10-CM

## 2020-01-21 DIAGNOSIS — E1122 Type 2 diabetes mellitus with diabetic chronic kidney disease: Secondary | ICD-10-CM | POA: Diagnosis present

## 2020-01-21 DIAGNOSIS — Z992 Dependence on renal dialysis: Secondary | ICD-10-CM

## 2020-01-21 DIAGNOSIS — N186 End stage renal disease: Secondary | ICD-10-CM

## 2020-01-21 DIAGNOSIS — Z90710 Acquired absence of both cervix and uterus: Secondary | ICD-10-CM

## 2020-01-21 DIAGNOSIS — T82868A Thrombosis of vascular prosthetic devices, implants and grafts, initial encounter: Secondary | ICD-10-CM

## 2020-01-21 DIAGNOSIS — Z885 Allergy status to narcotic agent status: Secondary | ICD-10-CM

## 2020-01-21 DIAGNOSIS — I252 Old myocardial infarction: Secondary | ICD-10-CM

## 2020-01-21 DIAGNOSIS — Z20822 Contact with and (suspected) exposure to covid-19: Secondary | ICD-10-CM | POA: Diagnosis present

## 2020-01-21 DIAGNOSIS — K449 Diaphragmatic hernia without obstruction or gangrene: Secondary | ICD-10-CM | POA: Diagnosis present

## 2020-01-21 DIAGNOSIS — Z79891 Long term (current) use of opiate analgesic: Secondary | ICD-10-CM

## 2020-01-21 DIAGNOSIS — Z79899 Other long term (current) drug therapy: Secondary | ICD-10-CM

## 2020-01-21 DIAGNOSIS — F329 Major depressive disorder, single episode, unspecified: Secondary | ICD-10-CM | POA: Diagnosis present

## 2020-01-21 DIAGNOSIS — I5032 Chronic diastolic (congestive) heart failure: Secondary | ICD-10-CM | POA: Diagnosis present

## 2020-01-21 DIAGNOSIS — Z681 Body mass index (BMI) 19 or less, adult: Secondary | ICD-10-CM

## 2020-01-21 DIAGNOSIS — E43 Unspecified severe protein-calorie malnutrition: Secondary | ICD-10-CM | POA: Diagnosis present

## 2020-01-21 DIAGNOSIS — K861 Other chronic pancreatitis: Secondary | ICD-10-CM

## 2020-01-21 DIAGNOSIS — Z91041 Radiographic dye allergy status: Secondary | ICD-10-CM

## 2020-01-21 DIAGNOSIS — N12 Tubulo-interstitial nephritis, not specified as acute or chronic: Secondary | ICD-10-CM | POA: Diagnosis not present

## 2020-01-21 DIAGNOSIS — Z7902 Long term (current) use of antithrombotics/antiplatelets: Secondary | ICD-10-CM

## 2020-01-21 DIAGNOSIS — T82898A Other specified complication of vascular prosthetic devices, implants and grafts, initial encounter: Secondary | ICD-10-CM | POA: Diagnosis present

## 2020-01-21 DIAGNOSIS — F1290 Cannabis use, unspecified, uncomplicated: Secondary | ICD-10-CM | POA: Diagnosis present

## 2020-01-21 DIAGNOSIS — N390 Urinary tract infection, site not specified: Secondary | ICD-10-CM | POA: Diagnosis present

## 2020-01-21 DIAGNOSIS — I480 Paroxysmal atrial fibrillation: Secondary | ICD-10-CM | POA: Diagnosis present

## 2020-01-21 DIAGNOSIS — Z9981 Dependence on supplemental oxygen: Secondary | ICD-10-CM

## 2020-01-21 DIAGNOSIS — G4733 Obstructive sleep apnea (adult) (pediatric): Secondary | ICD-10-CM | POA: Diagnosis present

## 2020-01-21 DIAGNOSIS — I959 Hypotension, unspecified: Secondary | ICD-10-CM | POA: Diagnosis not present

## 2020-01-21 DIAGNOSIS — Z716 Tobacco abuse counseling: Secondary | ICD-10-CM

## 2020-01-21 DIAGNOSIS — Y832 Surgical operation with anastomosis, bypass or graft as the cause of abnormal reaction of the patient, or of later complication, without mention of misadventure at the time of the procedure: Secondary | ICD-10-CM | POA: Diagnosis present

## 2020-01-21 DIAGNOSIS — M109 Gout, unspecified: Secondary | ICD-10-CM | POA: Diagnosis present

## 2020-01-21 DIAGNOSIS — D631 Anemia in chronic kidney disease: Secondary | ICD-10-CM | POA: Diagnosis present

## 2020-01-21 DIAGNOSIS — E785 Hyperlipidemia, unspecified: Secondary | ICD-10-CM | POA: Diagnosis present

## 2020-01-21 LAB — COMPREHENSIVE METABOLIC PANEL
ALT: 21 U/L (ref 0–44)
AST: 27 U/L (ref 15–41)
Albumin: 1.9 g/dL — ABNORMAL LOW (ref 3.5–5.0)
Alkaline Phosphatase: 216 U/L — ABNORMAL HIGH (ref 38–126)
Anion gap: 12 (ref 5–15)
BUN: 34 mg/dL — ABNORMAL HIGH (ref 8–23)
CO2: 24 mmol/L (ref 22–32)
Calcium: 8 mg/dL — ABNORMAL LOW (ref 8.9–10.3)
Chloride: 103 mmol/L (ref 98–111)
Creatinine, Ser: 2.81 mg/dL — ABNORMAL HIGH (ref 0.44–1.00)
GFR calc Af Amer: 19 mL/min — ABNORMAL LOW (ref >60)
GFR calc non Af Amer: 17 mL/min — ABNORMAL LOW (ref >60)
Glucose, Bld: 146 mg/dL — ABNORMAL HIGH (ref 70–99)
Potassium: 3.4 mmol/L — ABNORMAL LOW (ref 3.5–5.1)
Sodium: 139 mmol/L (ref 135–145)
Total Bilirubin: 0.8 mg/dL (ref 0.3–1.2)
Total Protein: 4.8 g/dL — ABNORMAL LOW (ref 6.5–8.1)

## 2020-01-21 LAB — CBC
HCT: 32 % — ABNORMAL LOW (ref 36.0–46.0)
Hemoglobin: 10.5 g/dL — ABNORMAL LOW (ref 12.0–15.0)
MCH: 33.9 pg (ref 26.0–34.0)
MCHC: 32.8 g/dL (ref 30.0–36.0)
MCV: 103.2 fL — ABNORMAL HIGH (ref 80.0–100.0)
Platelets: 183 10*3/uL (ref 150–400)
RBC: 3.1 MIL/uL — ABNORMAL LOW (ref 3.87–5.11)
RDW: 17.8 % — ABNORMAL HIGH (ref 11.5–15.5)
WBC: 6.1 10*3/uL (ref 4.0–10.5)
nRBC: 0 % (ref 0.0–0.2)

## 2020-01-21 LAB — URINALYSIS, ROUTINE W REFLEX MICROSCOPIC
Bilirubin Urine: NEGATIVE
Glucose, UA: NEGATIVE mg/dL
Ketones, ur: NEGATIVE mg/dL
Nitrite: NEGATIVE
Protein, ur: 30 mg/dL — AB
Specific Gravity, Urine: 1.018 (ref 1.005–1.030)
WBC, UA: >50 WBC/hpf — ABNORMAL HIGH (ref 0–5)
pH: 5 (ref 5.0–8.0)

## 2020-01-21 LAB — LIPASE, BLOOD: Lipase: 29 U/L (ref 11–51)

## 2020-01-21 MED ORDER — SODIUM CHLORIDE 0.9% FLUSH: 3.0000 mL | Freq: Once | INTRAVENOUS | Status: DC

## 2020-01-21 NOTE — ED Triage Notes (Signed)
Patient here for multiple reasons.  Patient having abdominal pain.  She states that she does have reoccurring pancreatitis.  She states that she also is a dialysis patient, usually goes Tu/Thur/Sat.  She has a blockage in her fistula on the left arm, she has swelling of her left hand, she has not been to dialysis since last Tuesday.  She was seen at [facility] on Saturday.

## 2020-01-22 ENCOUNTER — Encounter (HOSPITAL_COMMUNITY): Payer: Self-pay | Admitting: Nephrology

## 2020-01-22 ENCOUNTER — Emergency Department (HOSPITAL_COMMUNITY): Payer: Medicare Other

## 2020-01-22 DIAGNOSIS — G8929 Other chronic pain: Secondary | ICD-10-CM | POA: Diagnosis present

## 2020-01-22 DIAGNOSIS — I2510 Atherosclerotic heart disease of native coronary artery without angina pectoris: Secondary | ICD-10-CM | POA: Diagnosis present

## 2020-01-22 DIAGNOSIS — N186 End stage renal disease: Secondary | ICD-10-CM | POA: Diagnosis present

## 2020-01-22 DIAGNOSIS — E43 Unspecified severe protein-calorie malnutrition: Secondary | ICD-10-CM | POA: Diagnosis present

## 2020-01-22 DIAGNOSIS — E785 Hyperlipidemia, unspecified: Secondary | ICD-10-CM | POA: Diagnosis present

## 2020-01-22 DIAGNOSIS — Y832 Surgical operation with anastomosis, bypass or graft as the cause of abnormal reaction of the patient, or of later complication, without mention of misadventure at the time of the procedure: Secondary | ICD-10-CM | POA: Diagnosis present

## 2020-01-22 DIAGNOSIS — K859 Acute pancreatitis without necrosis or infection, unspecified: Secondary | ICD-10-CM | POA: Diagnosis present

## 2020-01-22 DIAGNOSIS — K219 Gastro-esophageal reflux disease without esophagitis: Secondary | ICD-10-CM | POA: Diagnosis present

## 2020-01-22 DIAGNOSIS — N12 Tubulo-interstitial nephritis, not specified as acute or chronic: Secondary | ICD-10-CM

## 2020-01-22 DIAGNOSIS — E1151 Type 2 diabetes mellitus with diabetic peripheral angiopathy without gangrene: Secondary | ICD-10-CM | POA: Diagnosis present

## 2020-01-22 DIAGNOSIS — Z20822 Contact with and (suspected) exposure to covid-19: Secondary | ICD-10-CM | POA: Diagnosis present

## 2020-01-22 DIAGNOSIS — T82818A Embolism of vascular prosthetic devices, implants and grafts, initial encounter: Secondary | ICD-10-CM | POA: Diagnosis present

## 2020-01-22 DIAGNOSIS — F329 Major depressive disorder, single episode, unspecified: Secondary | ICD-10-CM | POA: Diagnosis present

## 2020-01-22 DIAGNOSIS — I5032 Chronic diastolic (congestive) heart failure: Secondary | ICD-10-CM | POA: Diagnosis present

## 2020-01-22 DIAGNOSIS — J449 Chronic obstructive pulmonary disease, unspecified: Secondary | ICD-10-CM | POA: Diagnosis present

## 2020-01-22 DIAGNOSIS — Z955 Presence of coronary angioplasty implant and graft: Secondary | ICD-10-CM | POA: Diagnosis not present

## 2020-01-22 DIAGNOSIS — K449 Diaphragmatic hernia without obstruction or gangrene: Secondary | ICD-10-CM | POA: Diagnosis present

## 2020-01-22 DIAGNOSIS — Z681 Body mass index (BMI) 19 or less, adult: Secondary | ICD-10-CM | POA: Diagnosis not present

## 2020-01-22 DIAGNOSIS — J9611 Chronic respiratory failure with hypoxia: Secondary | ICD-10-CM | POA: Diagnosis present

## 2020-01-22 DIAGNOSIS — D631 Anemia in chronic kidney disease: Secondary | ICD-10-CM | POA: Diagnosis present

## 2020-01-22 DIAGNOSIS — G4733 Obstructive sleep apnea (adult) (pediatric): Secondary | ICD-10-CM | POA: Diagnosis present

## 2020-01-22 DIAGNOSIS — T82898A Other specified complication of vascular prosthetic devices, implants and grafts, initial encounter: Secondary | ICD-10-CM | POA: Diagnosis present

## 2020-01-22 DIAGNOSIS — N390 Urinary tract infection, site not specified: Secondary | ICD-10-CM | POA: Diagnosis present

## 2020-01-22 DIAGNOSIS — K861 Other chronic pancreatitis: Secondary | ICD-10-CM | POA: Diagnosis present

## 2020-01-22 DIAGNOSIS — I959 Hypotension, unspecified: Secondary | ICD-10-CM | POA: Diagnosis not present

## 2020-01-22 DIAGNOSIS — E1122 Type 2 diabetes mellitus with diabetic chronic kidney disease: Secondary | ICD-10-CM | POA: Diagnosis present

## 2020-01-22 DIAGNOSIS — I132 Hypertensive heart and chronic kidney disease with heart failure and with stage 5 chronic kidney disease, or end stage renal disease: Secondary | ICD-10-CM | POA: Diagnosis present

## 2020-01-22 LAB — CBG MONITORING, ED
Glucose-Capillary: 79 mg/dL (ref 70–99)
Glucose-Capillary: 95 mg/dL (ref 70–99)

## 2020-01-22 LAB — SARS CORONAVIRUS 2 BY RT PCR (HOSPITAL ORDER, PERFORMED IN ~~LOC~~ HOSPITAL LAB): SARS Coronavirus 2: NEGATIVE

## 2020-01-22 LAB — GLUCOSE, CAPILLARY: Glucose-Capillary: 95 mg/dL (ref 70–99)

## 2020-01-22 MED ORDER — ALLOPURINOL 100 MG PO TABS
100.0000 mg | ORAL_TABLET | Freq: Every day | ORAL | Status: DC
  Administered 2020-01-22 – 2020-01-24 (×2): 100 mg via ORAL
  Filled 2020-01-22 (×3): qty 1

## 2020-01-22 MED ORDER — ACETAMINOPHEN 325 MG PO TABS: 650.0000 mg | ORAL_TABLET | Freq: Four times a day (QID) | ORAL | Status: DC | PRN

## 2020-01-22 MED ORDER — CEFTRIAXONE SODIUM 1 G IJ SOLR: 1.0000 g | Freq: Once | INTRAMUSCULAR | Status: DC

## 2020-01-22 MED ORDER — PANTOPRAZOLE SODIUM 40 MG PO TBEC
40.0000 mg | DELAYED_RELEASE_TABLET | Freq: Every day | ORAL | Status: DC
  Administered 2020-01-22 – 2020-01-24 (×2): 40 mg via ORAL
  Filled 2020-01-22 (×2): qty 1

## 2020-01-22 MED ORDER — PROMETHAZINE HCL 12.5 MG PO TABS
12.5000 mg | ORAL_TABLET | ORAL | Status: DC | PRN
  Administered 2020-01-22: 12.5 mg via ORAL
  Filled 2020-01-22 (×2): qty 1

## 2020-01-22 MED ORDER — ONDANSETRON HCL 4 MG PO TABS: 4.0000 mg | ORAL_TABLET | Freq: Four times a day (QID) | ORAL | Status: DC | PRN

## 2020-01-22 MED ORDER — DIPHENHYDRAMINE HCL 25 MG PO CAPS: 50.0000 mg | ORAL_CAPSULE | Freq: Every day | ORAL | Status: DC

## 2020-01-22 MED ORDER — PREDNISONE 20 MG PO TABS
50.0000 mg | ORAL_TABLET | Freq: Four times a day (QID) | ORAL | Status: AC
  Administered 2020-01-22 – 2020-01-23 (×3): 50 mg via ORAL
  Filled 2020-01-22 (×3): qty 2

## 2020-01-22 MED ORDER — ACETAMINOPHEN 650 MG RE SUPP: 650.0000 mg | Freq: Four times a day (QID) | RECTAL | Status: DC | PRN

## 2020-01-22 MED ORDER — SODIUM CHLORIDE 0.9 % IV SOLN
1.0000 g | Freq: Once | INTRAVENOUS | Status: AC
  Administered 2020-01-22: 1 g via INTRAVENOUS
  Filled 2020-01-22: qty 10

## 2020-01-22 MED ORDER — SODIUM CHLORIDE 0.9 % IV SOLN: 1.0000 g | INTRAVENOUS | Status: DC

## 2020-01-22 MED ORDER — POLYETHYLENE GLYCOL 3350 17 G PO PACK: 17.0000 g | PACK | Freq: Every day | ORAL | Status: DC | PRN

## 2020-01-22 MED ORDER — CHLORHEXIDINE GLUCONATE CLOTH 2 % EX PADS
6.0000 | MEDICATED_PAD | Freq: Every day | CUTANEOUS | Status: DC
  Administered 2020-01-23 – 2020-01-24 (×2): 6 via TOPICAL

## 2020-01-22 MED ORDER — INSULIN ASPART 100 UNIT/ML ~~LOC~~ SOLN: 0.0000 [IU] | Freq: Every day | SUBCUTANEOUS | Status: DC

## 2020-01-22 MED ORDER — SODIUM CHLORIDE 0.9 % IV SOLN: 1.0000 g | Freq: Once | INTRAVENOUS | Status: DC

## 2020-01-22 MED ORDER — CLOPIDOGREL BISULFATE 75 MG PO TABS: 75.0000 mg | ORAL_TABLET | Freq: Every day | ORAL | Status: DC

## 2020-01-22 MED ORDER — HYDROMORPHONE HCL 1 MG/ML IJ SOLN
1.0000 mg | Freq: Once | INTRAMUSCULAR | Status: AC
  Administered 2020-01-22: 1 mg via INTRAVENOUS
  Filled 2020-01-22: qty 1

## 2020-01-22 MED ORDER — HEPARIN SODIUM (PORCINE) 5000 UNIT/ML IJ SOLN
5000.0000 [IU] | Freq: Three times a day (TID) | INTRAMUSCULAR | Status: DC
  Administered 2020-01-23 – 2020-01-24 (×3): 5000 [IU] via SUBCUTANEOUS
  Filled 2020-01-22 (×3): qty 1

## 2020-01-22 MED ORDER — FLUCONAZOLE 150 MG PO TABS
150.0000 mg | ORAL_TABLET | Freq: Once | ORAL | Status: AC
  Administered 2020-01-22: 150 mg via ORAL
  Filled 2020-01-22: qty 1

## 2020-01-22 MED ORDER — NICOTINE 21 MG/24HR TD PT24
21.0000 mg | MEDICATED_PATCH | Freq: Every day | TRANSDERMAL | Status: DC
  Administered 2020-01-22 – 2020-01-24 (×3): 21 mg via TRANSDERMAL
  Filled 2020-01-22 (×3): qty 1

## 2020-01-22 MED ORDER — INSULIN ASPART 100 UNIT/ML ~~LOC~~ SOLN
0.0000 [IU] | Freq: Three times a day (TID) | SUBCUTANEOUS | Status: DC
  Administered 2020-01-23 – 2020-01-24 (×3): 1 [IU] via SUBCUTANEOUS

## 2020-01-22 MED ORDER — GABAPENTIN 300 MG PO CAPS
300.0000 mg | ORAL_CAPSULE | Freq: Every day | ORAL | Status: DC
  Administered 2020-01-22 – 2020-01-24 (×2): 300 mg via ORAL
  Filled 2020-01-22 (×2): qty 1

## 2020-01-22 MED ORDER — ALBUTEROL SULFATE (2.5 MG/3ML) 0.083% IN NEBU: 2.5000 mg | INHALATION_SOLUTION | Freq: Four times a day (QID) | RESPIRATORY_TRACT | Status: DC | PRN

## 2020-01-22 MED ORDER — CARVEDILOL 25 MG PO TABS
25.0000 mg | ORAL_TABLET | Freq: Two times a day (BID) | ORAL | Status: DC
  Administered 2020-01-22 – 2020-01-24 (×4): 25 mg via ORAL
  Filled 2020-01-22 (×4): qty 1

## 2020-01-22 MED ORDER — HEPARIN SODIUM (PORCINE) 5000 UNIT/ML IJ SOLN: 5000.0000 [IU] | Freq: Three times a day (TID) | INTRAMUSCULAR | Status: DC

## 2020-01-22 MED ORDER — SODIUM CHLORIDE 0.9 % IV SOLN
1.0000 g | INTRAVENOUS | Status: DC
  Administered 2020-01-23: 1 g via INTRAVENOUS
  Filled 2020-01-22: qty 1

## 2020-01-22 MED ORDER — TRAZODONE HCL 150 MG PO TABS
150.0000 mg | ORAL_TABLET | Freq: Every day | ORAL | Status: DC
  Administered 2020-01-22 – 2020-01-24 (×2): 150 mg via ORAL
  Filled 2020-01-22 (×2): qty 1

## 2020-01-22 MED ORDER — HYDROMORPHONE HCL 1 MG/ML IJ SOLN
2.0000 mg | INTRAMUSCULAR | Status: DC | PRN
  Administered 2020-01-22 – 2020-01-23 (×5): 2 mg via INTRAVENOUS
  Filled 2020-01-22 (×5): qty 2

## 2020-01-22 MED ORDER — ONDANSETRON HCL 4 MG/2ML IJ SOLN
4.0000 mg | Freq: Four times a day (QID) | INTRAMUSCULAR | Status: DC | PRN
  Administered 2020-01-23 – 2020-01-24 (×4): 4 mg via INTRAVENOUS
  Filled 2020-01-22 (×4): qty 2

## 2020-01-22 MED ORDER — AMLODIPINE BESYLATE 10 MG PO TABS
10.0000 mg | ORAL_TABLET | Freq: Every day | ORAL | Status: DC
  Administered 2020-01-22: 10 mg via ORAL
  Filled 2020-01-22 (×2): qty 1

## 2020-01-22 MED ORDER — ATORVASTATIN CALCIUM 40 MG PO TABS
40.0000 mg | ORAL_TABLET | Freq: Every day | ORAL | Status: DC
  Administered 2020-01-22 – 2020-01-24 (×2): 40 mg via ORAL
  Filled 2020-01-22 (×2): qty 1

## 2020-01-22 MED ORDER — DIPHENHYDRAMINE HCL 25 MG PO CAPS
50.0000 mg | ORAL_CAPSULE | Freq: Once | ORAL | Status: AC
  Administered 2020-01-23: 50 mg via ORAL
  Filled 2020-01-22: qty 2

## 2020-01-22 MED ORDER — PANCRELIPASE (LIP-PROT-AMYL) 12000-38000 UNITS PO CPEP
24000.0000 [IU] | ORAL_CAPSULE | Freq: Three times a day (TID) | ORAL | Status: DC
  Administered 2020-01-22 – 2020-01-24 (×8): 24000 [IU] via ORAL
  Filled 2020-01-22 (×9): qty 2

## 2020-01-22 MED ORDER — HYDROMORPHONE HCL 1 MG/ML IJ SOLN: 1.0000 mg | Freq: Once | INTRAMUSCULAR | Status: DC

## 2020-01-22 NOTE — H&P (Addendum)
Date: 01/22/2020               Patient Name:  [name] MRN: [id number]  DOB: 09/08/1950 Age / Sex: 69 y.o., female   PCP: Bernerd Limbo, MD         Medical Service: Internal Medicine Teaching Service         Attending Physician: Dr. Lucious Groves, DO    First Contact: Dr. Madilyn Fireman Pager: [phone]  Second Contact: Dr. Sherry Ruffing Pager: [phone]       After Hours (After 5p/  First Contact Pager: [phone]  weekends / holidays): Second Contact Pager: [phone]   Chief Complaint: Abdominal pain  History of Present Illness: [name] is a 69 y.o female with chronic pancreatitis requiring high dose opiate analgesics, HTN, ESRD on HD TRS, CAD/PAD, and COPD who presented to the ED with acute on chronic abdominal pain. History was obtained via the patient and throguh chart review.   Patient states that she struggles with chronic pancreatitis and take dilaudid 2-4mg  every 3 hours at home. She went in for a nerve block on 4/19 for her chronic pain and since that time has had decreased appetite and generalized weakness. Approximately 2-3 days ago she developed acute worsening of her chronic abdominal pain. The pain is located in the epigastric and RUQ regions radiating through to the back. She describes the pain as stabbing and cramping. She has had minimal improvement with her home analgesics. This is consistent with prior episodes of pancreatitis and the reason she came to the ED.   In addition, she has been on HD since 2019 and states that her renal damage was secondary to hypertension and contrast studies for her CAD/PVD. She still produced urine but it varies in quantity. She has had some increased frequency and foul smelling urine but no dysuria. She has not had HD since 01/15/20 due to a partially blocked fistula.   Meds:  Current Meds  Medication Sig  . albuterol (VENTOLIN HFA) 108 (90 Base) MCG/ACT inhaler Inhale 1-2 puffs into the lungs every 6 (six) hours as needed for wheezing or  shortness of breath.  . allopurinol (ZYLOPRIM) 100 MG tablet Take 100 mg by mouth at bedtime.   Marland Kitchen amLODipine (NORVASC) 10 MG tablet Take 10 mg by mouth at bedtime.  Marland Kitchen atorvastatin (LIPITOR) 40 MG tablet Take 40 mg by mouth at bedtime.   . B Complex-C-Zn-Folic Acid (DIALYVITE 696-VELF 15) 0.8 MG TABS Take 1 tablet by mouth at bedtime.   . carvedilol (COREG) 25 MG tablet Take 25 mg by mouth 2 (two) times daily.  . clopidogrel (PLAVIX) 75 MG tablet Take 75 mg by mouth at bedtime.   . diphenhydrAMINE (BENADRYL) 25 MG tablet Take 50 mg by mouth at bedtime.  . gabapentin (NEURONTIN) 300 MG capsule Take 1 capsule (300 mg total) by mouth at bedtime.  . hydrALAZINE (APRESOLINE) 25 MG tablet Take 1 tablet (25 mg total) by mouth every 8 (eight) hours. (Patient taking differently: Take 25 mg by mouth at bedtime. )  . HYDROmorphone (DILAUDID) 2 MG tablet Take 2-4 mg by mouth every 4 (four) hours.   . pantoprazole (PROTONIX) 40 MG tablet Take 1 tablet (40 mg total) by mouth daily. (Patient taking differently: Take 40 mg by mouth at bedtime. )  . promethazine (PHENERGAN) 12.5 MG tablet Take 12.5 mg by mouth every 4 (four) hours as needed for nausea or vomiting.  . traZODone (DESYREL) 100 MG tablet Take 150 mg  by mouth at bedtime.    Allergies: Allergies as of 01/21/2020 - Review Complete 01/21/2020  Allergen Reaction Noted  . Isosorbide Other (See Comments) 10/28/2015  . Codeine Itching 10/28/2015  . Contrast media [iodinated diagnostic agents] Itching and Other (See Comments) 05/29/2012  . Ioxaglate Itching and Other (See Comments) 02/24/2015  . Isosorbide nitrate Other (See Comments) 10/28/2015  . Metrizamide Itching and Other (See Comments) 07/03/2014  . Oxycodone-acetaminophen Itching 10/28/2015  . Percocet [oxycodone-acetaminophen] Itching and Other (See Comments) 09/04/2013   Past Medical History:  Diagnosis Date  . AAA (abdominal aortic aneurysm) ([place]) 5/09   3.7x3.3 by u/s 2009  . Abnormal  EKG    deep TW inversions chronic  . Anemia   . CAD (coronary artery disease)    a. CABG 03/2011 LIMA to LAD, SVG to OM, SVG to PDA. b. cath 06/25/2015 DES to SVG to rPDA, patent LIMA to LAD, patent SVG to OM  . carotid stenosis 5/09   S/p L CEA ;  60-79% bilat ICA by preCABG dopplers 7/12  . CHF (congestive heart failure) ([place])   . Chronic back pain    "all over" (06/24/2015)  . Chronic bronchitis ([place])    "get it pretty much q yr" (06/24/2015)  . CKD (chronic kidney disease)   . Complication of anesthesia 1966   "problem w/ether"  . COPD (chronic obstructive pulmonary disease) ([place])   . Depression   . Dysrhythmia   . GERD (gastroesophageal reflux disease)    With hiatal hernia  . GIB (gastrointestinal bleeding) 9/11   S/p EGD with cautery at [facility]  . Heart murmur   . History of blood transfusion "a few times"   "all related to anemia" (06/24/2015)  . History of hiatal hernia   . Hyperlipidemia   . Hypertension   . Mitral regurgitation    3+ MR by intraoperative TEE;  s/p MV repair with Dr. Roxy Manns 7/12  . Myocardial infarction [facility]) 2012 "several"  . On home oxygen therapy    "2 liters at night; negative for sleep apnea"  . PAD (peripheral artery disease) ([facility])    Severe; s/p bilateral renal artery stents, moderate in-stent restenosis  . Pancreatitis 05/2018  . Paroxysmal atrial fibrillation (HCC)    coumadin;  echo 9/07: EF 60%, mild LVH;  s/p Cox Maze 7/12 with LAA clipping  . Subclavian artery stenosis, left (HCC)    stented by Dr. Trula Slade on 10/18 to help flow of her LIMA to LAD  . Type II diabetes mellitus (HCC)    Family History: Family history is significant for HTN, CAD, and PVD on her father's side of the family.   Social History: Patient lives alone at home with her 2 cats. She has a daughter that lives in the area. She smokes 3/4 PPD and has done so since the age of 41. She drinks EtOH socially and occasionally smokes marijuana.  Review of Systems: A complete ROS  was negative except as per HPI.   Physical Exam: Blood pressure (!) 130/55, pulse 60, temperature 98.1 F (36.7 C), temperature source Oral, resp. rate 16, SpO2 96 %.  General: Thin, ill appearing female in no acute distress HENT: Normocephalic, atraumatic, moist mucus membranes Pulm: Good air movement with no wheezing or crackles  CV: RRR, systolic murmur Abdomen: Active bowel sounds, soft, non-distended, tenderness to palpation of the epigastric, RUQ, and suprapubic region  Extremities: Pulses palpable in all extremities, 1+ BLE edema to the mid shin Skin: Warm and dry  Neuro: Alert and oriented x 3  CXR: personally reviewed my interpretation is good inspiration and penetration. Slightly rotated film. Prior sternotomy with markers for prior bypass and left atrial appendage closure. No mediastinal widening. No cardiac enlargement. No pleural effusions. No infiltrates/opacities.   Assessment & Plan by Problem: Active Problems:   AV fistula occlusion (HCC)  [name] is a 69 y.o female with chronic pancreatitis requiring high dose opiate analgesics, HTN, ESRD on HD TRS, CAD/PAD, and COPD who presented to the ED with acute on chronic abdominal pain. Given her uncontrolled pain and need to AV fistula intervention admission was requested.   Acute on Chronic Pancreatitis  - Patient with baseline abdominal pain now with acute worsening. Description is consistent with prior episodes of pancreatitis. Her lipase is not elevated but this is not completely reflective of an acute event in the setting of chronic pancreatitis. Will defer imaging for now.  - Patient on PO hydromorphone 2-4mg  every 3 hours. Requesting IV medications. Will start 2mg  every 2 hours while here - Full liquid diet.  - No IVF in the setting of ESRD and evidence of hypervolemia  - PRN zofran  - Continue creon TID WC  UTI - Patient with increased frequency and foul smelling urine. UA consistent with pyuria and bacteruria    - Continue Ceftriaxone 1g for 3 days.   ESRD  Partially occluded AVF - Patient has not had HD since 5/11 but no emergent indications for HD  - IR has been consulted and is planning procedure for 5/19 at 1300. Will need premedicated given contrast allergy  - Nephrology is onboard and has placed HD orders   Hypertension  HLD  CAD s/p CABG in 2012  PVD  - Continue Amlodipine 10 mg QD, Carvedilol 25 mg BID, Atorvastatin 40 mg QHS  - Hold Plavix 75 mg QD until IR has evaluated  - Needs to quit smoking   Diet: Full liquid diet. NPO at midnight  VTE ppx: Hold SubQ Heparin until after procedure CODE STATUS: Full Code  Dispo: Admit patient to Inpatient with expected length of stay greater than 2 midnights.  SignedIna Homes, MD 01/22/2020, 10:42 AM  Pager: [phone]

## 2020-01-22 NOTE — ED Notes (Signed)
Dinner ordered 

## 2020-01-22 NOTE — ED Notes (Signed)
No answer for vitals recheck x1 

## 2020-01-22 NOTE — ED Notes (Signed)
Lunch Tray Ordered @ [id number].

## 2020-01-22 NOTE — Consult Note (Signed)
Chief Complaint: Declot  Referring Physician(s): Dr. Mickel Crow  Supervising Physician: [name]  Patient Status: [facility] - In-pt  History of Present Illness: [name] is a 69 y.o. female History of ESRD on HD, chronic pancreatitis presented to this facility with abdominal pain. While in the ED patient reported that her fistula clotted on May 13.  Team is requesting decot. Per notes from nephrology patient last had dialysis on 5.11.21  Patient had creation of AV fistula left on 2.28.19 and a balloon angioplasty of her fistula by cardiovascular on 7.16.19.  of note patient was seen for similar complaint at [facility] on 5.17.21   Past Medical History:  Diagnosis Date  . AAA (abdominal aortic aneurysm) ([place]) 5/09   3.7x3.3 by u/s 2009  . Abnormal EKG    deep TW inversions chronic  . Anemia   . CAD (coronary artery disease)    a. CABG 03/2011 LIMA to LAD, SVG to OM, SVG to PDA. b. cath 06/25/2015 DES to SVG to rPDA, patent LIMA to LAD, patent SVG to OM  . carotid stenosis 5/09   S/p L CEA ;  60-79% bilat ICA by preCABG dopplers 7/12  . CHF (congestive heart failure) ([place])   . Chronic back pain    "all over" (06/24/2015)  . Chronic bronchitis ([place])    "get it pretty much q yr" (06/24/2015)  . CKD (chronic kidney disease)   . Complication of anesthesia 1966   "problem w/ether"  . COPD (chronic obstructive pulmonary disease) ([place])   . Depression   . Dysrhythmia   . GERD (gastroesophageal reflux disease)    With hiatal hernia  . GIB (gastrointestinal bleeding) 9/11   S/p EGD with cautery at [facility]  . Heart murmur   . History of blood transfusion "a few times"   "all related to anemia" (06/24/2015)  . History of hiatal hernia   . Hyperlipidemia   . Hypertension   . Mitral regurgitation    3+ MR by intraoperative TEE;  s/p MV repair with Dr. Roxy Manns 7/12  . Myocardial infarction [facility]) 2012 "several"  . On home oxygen therapy    "2 liters at night; negative for sleep  apnea"  . PAD (peripheral artery disease) (HCC)    Severe; s/p bilateral renal artery stents, moderate in-stent restenosis  . Pancreatitis 05/2018  . Paroxysmal atrial fibrillation (HCC)    coumadin;  echo 9/07: EF 60%, mild LVH;  s/p Cox Maze 7/12 with LAA clipping  . Subclavian artery stenosis, left (HCC)    stented by Dr. Trula Slade on 10/18 to help flow of her LIMA to LAD  . Type II diabetes mellitus ([place])     Past Surgical History:  Procedure Laterality Date  . A/V FISTULAGRAM Left 03/21/2018   Procedure: A/V FISTULAGRAM;  Surgeon: Serafina Mitchell, MD;  Location: [place] [facility];  Service: Cardiovascular;  Laterality: Left;  . ABDOMINAL AORTIC ENDOVASCULAR STENT GRAFT N/A 01/17/2018   Procedure: ABDOMINAL AORTIC ENDOVASCULAR STENT GRAFT WITH CO2;  Surgeon: Serafina Mitchell, MD;  Location: [place];  Service: Vascular;  Laterality: N/A;  . ABDOMINAL HYSTERECTOMY  1990's  . APPENDECTOMY  Aug. 11, 2016   Ruptured  . AV FISTULA PLACEMENT Left 11/03/2017   Procedure: ARTERIOVENOUS (AV) FISTULA CREATION LEFT ARM;  Surgeon: Serafina Mitchell, MD;  Location: [place];  Service: Vascular;  Laterality: Left;  . BOWEL RESECTION  2013  . CARDIAC CATHETERIZATION N/A 06/24/2015   Procedure: Left Heart Cath and Cors/Grafts  Angiography;  Surgeon: Leonie Man, MD;  Location: [place] [facility];  Service: Cardiovascular;  Laterality: N/A;  . CARDIAC CATHETERIZATION N/A 06/25/2015   Procedure: Coronary Stent Intervention;  Surgeon: Sherren Mocha, MD;  Location: [place] [facility];  Service: Cardiovascular;  Laterality: N/A;  . CAROTID ENDARTERECTOMY Left   . CATARACT EXTRACTION Right 03/27/2018  . CHOLECYSTECTOMY N/A 04/19/2018   Procedure: LAPAROSCOPIC CHOLECYSTECTOMY WITH INTRAOPERATIVE CHOLANGIOGRAM;  Surgeon: Rolm Bookbinder, MD;  Location: [place];  Service: General;  Laterality: N/A;  . CORONARY ANGIOPLASTY    . CORONARY ARTERY BYPASS GRAFT  03/05/2011   CABG X3 (LIMA to LAD, SVG to OM, SVG to  PDA, EVH via left thigh  . CORONARY STENT PLACEMENT    . ESOPHAGOGASTRODUODENOSCOPY N/A 04/12/2018   Procedure: ESOPHAGOGASTRODUODENOSCOPY (EGD);  Surgeon: [name], MD;  [facility] ENDOSCOPY;  Service: Endoscopy;  Laterality: N/A;  . ESOPHAGOGASTRODUODENOSCOPY N/A 06/16/2018   Procedure: ESOPHAGOGASTRODUODENOSCOPY (EGD);  Surgeon: Thornton Park, MD;  Location: [place];  Service: Gastroenterology;  Laterality: N/A;  . IR RADIOLOGIST EVAL & MGMT  09/13/2019  . IR RADIOLOGIST EVAL & MGMT  10/31/2019  . IR RADIOLOGIST EVAL & MGMT  11/14/2019  . IR RADIOLOGIST EVAL & MGMT  01/02/2020  . LACERATION REPAIR Right 1990's   WRIST  . MAZE  03/05/2011   complete biatrial lesion set with clipping of LA appendage  . MITRAL VALVE REPAIR  03/05/2011   40mm Memo 3D ring annuloplasty for ischemic MR  . PERIPHERAL VASCULAR BALLOON ANGIOPLASTY Left 03/21/2018   Procedure: PERIPHERAL VASCULAR BALLOON ANGIOPLASTY;  Surgeon: Serafina Mitchell, MD;  Location: [place] [facility];  Service: Cardiovascular;  Laterality: Left;  . PERIPHERAL VASCULAR CATHETERIZATION N/A 06/24/2015   Procedure: Aortic Arch Angiography;  Surgeon: Serafina Mitchell, MD;  Location: [place] [facility];  Service: Cardiovascular;  Laterality: N/A;  . PERIPHERAL VASCULAR CATHETERIZATION  06/24/2015   Procedure: Peripheral Vascular Intervention;  Surgeon: Serafina Mitchell, MD;  Location: [place] [facility];  Service: Cardiovascular;;  . PERIPHERAL VASCULAR CATHETERIZATION N/A 06/24/2015   Procedure: Abdominal Aortogram;  Surgeon: Serafina Mitchell, MD;  Location: [place] [facility];  Service: Cardiovascular;  Laterality: N/A;  . RENAL ANGIOGRAM Bilateral 09/11/2013   Procedure: RENAL ANGIOGRAM;  Surgeon: Serafina Mitchell, MD;  Location: [facility] CATH LAB;  Service: Cardiovascular;  Laterality: Bilateral;  . RIGHT FEMORAL-POPLITEAL BYPASS      Allergies: Isosorbide, Codeine, Contrast media [iodinated diagnostic agents], Ioxaglate, Isosorbide  nitrate, Metrizamide, Oxycodone-acetaminophen, and Percocet [oxycodone-acetaminophen]  Medications: Prior to Admission medications   Medication Sig Start Date End Date Taking? Authorizing Provider  albuterol (VENTOLIN HFA) 108 (90 Base) MCG/ACT inhaler Inhale 1-2 puffs into the lungs every 6 (six) hours as needed for wheezing or shortness of breath.   Yes [provider]  allopurinol (ZYLOPRIM) 100 MG tablet Take 100 mg by mouth at bedtime.  02/02/17  Yes [provider]  amLODipine (NORVASC) 10 MG tablet Take 10 mg by mouth at bedtime. 11/20/19  Yes [provider]  atorvastatin (LIPITOR) 40 MG tablet Take 40 mg by mouth at bedtime.  12/07/17  Yes [provider]  B Complex-C-Zn-Folic Acid (DIALYVITE 850-YDXA 15) 0.8 MG TABS Take 1 tablet by mouth at bedtime.  08/09/19  Yes [provider]  carvedilol (COREG) 25 MG tablet Take 25 mg by mouth 2 (two) times daily. 04/16/19  Yes [provider]  clopidogrel (PLAVIX) 75 MG tablet Take 75 mg by mouth at bedtime.  10/02/19  Yes [provider]  diphenhydrAMINE (BENADRYL) 25 MG tablet Take 50 mg by mouth at bedtime.   Yes [provider]  gabapentin (NEURONTIN) 300 MG capsule Take 1 capsule (300 mg total) by mouth at bedtime. 08/22/18  Yes Mikhail, Velta Addison, DO  hydrALAZINE (APRESOLINE) 25 MG tablet Take 1 tablet (25 mg total) by mouth every 8 (eight) hours. Patient taking differently: Take 25 mg by mouth at bedtime.  06/26/18  Yes Thurnell Lose, MD  HYDROmorphone (DILAUDID) 2 MG tablet Take 2-4 mg by mouth every 4 (four) hours.    Yes [provider]  pantoprazole (PROTONIX) 40 MG tablet Take 1 tablet (40 mg total) by mouth daily. Patient taking differently: Take 40 mg by mouth at bedtime.  08/03/18  Yes Patrecia Pour, MD  promethazine (PHENERGAN) 12.5 MG tablet Take 12.5 mg by mouth every 4 (four) hours as needed for nausea or vomiting.   Yes [provider]    traZODone (DESYREL) 100 MG tablet Take 150 mg by mouth at bedtime.  04/19/19  Yes [provider]  amLODipine (NORVASC) 5 MG tablet Take 1 tablet (5 mg total) by mouth daily. Patient not taking: Reported on 01/22/2020 08/23/18   Cristal Ford, DO  CREON 12000 units CPEP capsule Take 2 capsules (24,000 Units total) by mouth 3 (three) times daily before meals. Patient not taking: Reported on 01/19/2020 05/18/19   Regalado, Jerald Kief A, MD  OXYGEN Inhale 2 L into the lungs at bedtime.     [provider]     Family History  Problem Relation Age of Onset  . Emphysema Mother   . Heart disease Mother        before age 65  . Hypertension Mother   . Hyperlipidemia Mother   . Heart attack Mother   . AAA (abdominal aortic aneurysm) Mother        rupture  . Heart attack Father 29  . Emphysema Father   . Heart disease Father        before age 105  . Hyperlipidemia Father   . Hypertension Father   . Peripheral vascular disease Father   . Diabetes Brother   . Heart disease Brother        before age 42  . Hyperlipidemia Brother   . Hypertension Brother   . Heart attack Brother        CABG  . Heart disease Other        Vascular disease in grandparents, uncles and dad  . Colon cancer Neg Hx   . Esophageal cancer Neg Hx   . Inflammatory bowel disease Neg Hx   . Liver disease Neg Hx   . Pancreatic cancer Neg Hx   . Rectal cancer Neg Hx   . Stomach cancer Neg Hx     Social History   Socioeconomic History  . Marital status: Widowed    Spouse name: Not on file  . Number of children: Not on file  . Years of education: Not on file  . Highest education level: Not on file  Occupational History  . Occupation: Scientist, water quality in past    Employer: DISABLED  Tobacco Use  . Smoking status: Current Every Day Smoker    Packs/day: 0.75    Years: 50.00    Pack years: 37.50    Types: Cigarettes  . Smokeless tobacco: Never Used  . Tobacco comment: 3/4 pk per day  Substance and Sexual  Activity  . Alcohol use: Yes  Alcohol/week: 0.0 standard drinks  . Drug use: Yes    Types: Marijuana  . Sexual activity: Not Currently    Birth control/protection: None  Other Topics Concern  . Not on file  Social History Narrative   Widowed in 2009.   Social Determinants of Health   Financial Resource Strain:   . Difficulty of Paying Living Expenses:   Food Insecurity:   . Worried About Charity fundraiser in the Last Year:   . Arboriculturist in the Last Year:   Transportation Needs:   . Film/video editor (Medical):   Marland Kitchen Lack of Transportation (Non-Medical):   Physical Activity:   . Days of Exercise per Week:   . Minutes of Exercise per Session:   Stress:   . Feeling of Stress :   Social Connections:   . Frequency of Communication with Friends and Family:   . Frequency of Social Gatherings with Friends and Family:   . Attends Religious Services:   . Active Member of Clubs or Organizations:   . Attends Archivist Meetings:   Marland Kitchen Marital Status:    Review of Systems: A 12 point ROS discussed and pertinent positives are indicated in the HPI above.  All other systems are negative.  Review of Systems  Constitutional: Negative for fatigue and fever.  HENT: Negative for congestion.   Respiratory: Negative for cough and shortness of breath.   Gastrointestinal: Positive for abdominal pain. Negative for diarrhea, nausea and vomiting.    Vital Signs: BP (!) 130/55   Pulse 60   Temp 98.1 F (36.7 C) (Oral)   Resp 16   SpO2 96%   Physical Exam Vitals and nursing note reviewed.  Constitutional:      Appearance: She is well-developed.  HENT:     Head: Normocephalic and atraumatic.  Eyes:     Conjunctiva/sclera: Conjunctivae normal.  Cardiovascular:     Rate and Rhythm: Normal rate and regular rhythm.     Comments: Left arm AV Pulmonary:     Effort: Pulmonary effort is normal.     Breath sounds: Normal breath sounds.  Musculoskeletal:        General:  Normal range of motion.     Cervical back: Normal range of motion.  Skin:    General: Skin is warm.  Neurological:     Mental Status: She is alert and oriented to person, place, and time.     Imaging: DG Chest 1 View  Result Date: 01/19/2020 CLINICAL DATA:  Weakness EXAM: CHEST  1 VIEW COMPARISON:  08/23/2019 FINDINGS: Post sternotomy changes with valve prosthesis and appendage clip. No focal opacity or pleural effusion. Stable cardiomediastinal silhouette with aortic atherosclerosis. No pneumothorax. Left axillary vascular stent. IMPRESSION: No active disease. Electronically Signed   By: Donavan Foil M.D.   On: 01/19/2020 20:33   CT BIOPSY  Result Date: 12/24/2019 CLINICAL DATA:  History of chronic intractable abdominal pain secondary to chronic pancreatitis. Status post prior CT-guided neurolysis of the celiac plexus on 09/24/2019 which resulted in some improvement in right-sided abdominal pain. Due to persistent midline and left-sided pain requiring ex elevated use of pain medications, a second celiac block is now performed. EXAM: CT GUIDED NEUROLYTIC ABLATION OF THE CELIAC AXIS ANESTHESIA/SEDATION: Versed 4.0 mg IV, dilaudid 2 mg IV Total Moderate Sedation Time 32 minutes. The patient's level of consciousness and physiologic status were continuously monitored during the procedure by Radiology nursing. PROCEDURE: The procedure risks, benefits, and alternatives were explained  to the patient. Questions regarding the procedure were encouraged and answered. The patient understands and consents to the procedure. A time-out was performed prior to initiating the procedure. The anterior abdominal wall was prepped with chlorhexidine in a sterile fashion, and a sterile drape was applied covering the operative field. A sterile gown and sterile gloves were used for the procedure. Local anesthesia was provided with 1% Lidocaine. A 22 gauge Chiba needle was advanced under CT guidance to the level of the celiac  plexus. After confirming needle tip position, approximately 4 mL of a 1:10 dilution of Omnipaque-300 contrast and sterile saline was injected. Spread of diluted contrast material was confirmed by CT. A mixture of 7 ml of 0.5% Sensorcaine and 80 mg Depo-Medrol was then made. This was injected through the needle. Alcohol ablation of the celiac plexus was then performed with injection of 15 ml of absolute alcohol. An additional 3 mL of 0.5% Sensorcaine was injected after alcohol injection. COMPLICATIONS: None FINDINGS: Contrast injection showed spread across the midline and predominantly towards the left at the level of the celiac plexus. Alcohol ablation was successfully performed. Alcohol injection was stopped when the patient did start to experience exacerbation of pain which was felt today as primarily left shoulder pain radiating into the back. Some additional Sensorcaine was injected after alcohol ablation. IMPRESSION: CT guided neurolytic ablation of the celiac plexus as above. Alcohol ablation was performed with 15 ml of absolute alcohol. Therapeutic injection of Sensorcaine and Depo-Medrol was also performed. Electronically Signed   By: Aletta Edouard M.D.   On: 12/24/2019 13:01   DG Chest Portable 1 View  Result Date: 01/22/2020 CLINICAL DATA:  Leg swelling and shortness of breath. Last dialysis was 1 week ago. EXAM: PORTABLE CHEST 1 VIEW COMPARISON:  01/19/2020 FINDINGS: The cardiac silhouette, mediastinal and hilar contours are within normal limits and stable. Stable surgical changes from bypass surgery, valve replacement surgery and left atrial appendage closure device. Coronary artery stents are also noted. Left subclavian stents are noted also. There is tortuosity and calcification of the thoracic aorta. The lungs are clear. No edema, infiltrates or effusions. No worrisome pulmonary lesions. The bony thorax is intact. Remote healed right rib fractures are noted. IMPRESSION: No acute cardiopulmonary  findings. Electronically Signed   By: Marijo Sanes M.D.   On: 01/22/2020 05:39   IR Radiologist Eval & Mgmt  Result Date: 01/02/2020 Please refer to notes tab for details about interventional procedure. (Op Note)   Labs:  CBC: Recent Labs    10/23/19 0425 12/24/19 0955 01/19/20 2020 01/21/20 2157  WBC 8.8 8.4 6.8 6.1  HGB 8.1* 11.3* 9.6* 10.5*  HCT 24.4* 35.2* 29.0* 32.0*  PLT 193 199 158 183    COAGS: Recent Labs    09/24/19 1025 12/24/19 0955  INR 1.0 1.0    BMP: Recent Labs    10/21/19 0808 10/25/19 1203 10/25/19 1445 12/24/19 0955 01/19/20 2020 01/21/20 2157  NA 137  --   --  139 138 139  K 3.7   < > 3.3* 3.3* 3.2* 3.4*  CL 101  --   --  99 103 103  CO2 29  --   --  28 25 24   GLUCOSE 86  --   --  127* 129* 146*  BUN 11  --   --  11 27* 34*  CALCIUM 7.8*  --   --  8.4* 7.9* 8.0*  CREATININE 1.46*  --   --  2.30* 2.39* 2.81*  GFRNONAA  37*  --   --  21* 20* 17*  GFRAA 43*  --   --  25* 24* 19*   < > = values in this interval not displayed.    LIVER FUNCTION TESTS: Recent Labs    10/16/19 1652 10/17/19 0250 01/19/20 2020 01/21/20 2157  BILITOT 0.9 0.9 0.9 0.8  AST 20 18 43* 27  ALT 15 14 28 21   ALKPHOS 152* 131* 192* 216*  PROT 5.2* 5.2* 4.5* 4.8*  ALBUMIN 2.4* 2.4* 2.0* 1.9*    Assessment and Plan:  69 y.o, female inpatient ( in the ED). History of ESRD on HD, chronic pancreatitis presented to this facility with abdominal pain. While in the ED patient reported that her fistula clotted on May 13.  Team is requesting decot. Per notes from nephrology patient last had dialysis on 5.11.21  Patient had creation of AV fistula left on 2.28.19 and a balloon angioplasty of her fistula by cardiovascular on 7.16.19.  of note patient was seen for similar complaint at [facility] on 5.17.21  Pertinent Imaging none  Pertinent IR History 4.19.21 - Ablation of celiac plexus 1.18.21 - Ablation of celiac plexus  Pertinent Allergies Contrast reaction   Cr  2.81, BUN 34, Potassium 3.4 All labs are within acceptable parameters. Patient is on plavix Patient is afebrile.  IR consulted for possible declot, possible angioplasty, possible stent placement possible HD catheter placemetn. Case has been reviewed and procedure approved by Dr. Earleen [place]. .  Team instructed to: Keep Patient to be NPO after midnight Hold prophylactic anticoagulation 24 hours prior to scheduled procedure.   Patient will need to be premedicated with 50 mg of prednisone  PO13 h, 7 h and 1 prior to scheduled procedure and 50 mg POof benadryl 1 hour prior to procedure.   Patient tentatively scheduled for 5.19.21 @ 13:00  IR will call patient when ready.   Risks and benefits discussed with the patient including, but not limited to bleeding, infection, vascular injury, pulmonary embolism, need for tunneled HD catheter placement or even death.  All of the patient's questions were answered, patient is agreeable to proceed.  Consent signed and in IR control room    Thank you for this interesting consult.  I greatly enjoyed meeting TATA TIMMINS and look forward to participating in their care.  A copy of this report was sent to the requesting provider on this date.  Electronically Signed: Avel Peace, NP 01/22/2020, 9:21 AM   I spent a total of 40 Minutes    in face to face in clinical consultation, greater than 50% of which was counseling/coordinating care for fistula declot

## 2020-01-22 NOTE — Consult Note (Signed)
Renal Service Consult Note [name] 01/22/2020 [place]azing Requesting Physician:  Dr Leonides Schanz, K.   Reason for Consult:  ESRD pt w/ malfunctioning AVF HPI: The patient is a 69 y.o. year-old h/o COPD on home O2, PAD, CAD/ MI/ sp CABG '12, HTN, GIB, depression, AAA sp stent graft '19, sp CEA and ESRD on HD presenting to ED last night for abd pain and blocked AV fistula. Last HD Tuesday 1 wk ago. +bilat ankles swollen.  Ed eval showed neg CXR, UA concerning for infection, other labs okay. Lipase wnl. Asked to see for ESRD.    Pt states unable to get HD on Thursday due to "fistula clots". Missed HD sat as well. No sig SOB. Has acute on chronic upper abd pain w/ long hx chronic abd pain /pancreatitis. Is on po dilaudid q 4h at home, Creon ac.   Denies any CP , prod cough, sig SOB. Has home O2 for COPD. Pt has dye allergy, not sure details.    ROS  denies CP  no joint pain   no HA  no blurry vision  no rash  no diarrhea  no nausea/ vomiting   Past Medical History  Past Medical History:  Diagnosis Date  . AAA (abdominal aortic aneurysm) ([place]) 5/09   3.7x3.3 by u/s 2009  . Abnormal EKG    deep TW inversions chronic  . Anemia   . CAD (coronary artery disease)    a. CABG 03/2011 LIMA to LAD, SVG to OM, SVG to PDA. b. cath 06/25/2015 DES to SVG to rPDA, patent LIMA to LAD, patent SVG to OM  . carotid stenosis 5/09   S/p L CEA ;  60-79% bilat ICA by preCABG dopplers 7/12  . CHF (congestive heart failure) ([place])   . Chronic back pain    "all over" (06/24/2015)  . Chronic bronchitis ([place])    "get it pretty much q yr" (06/24/2015)  . CKD (chronic kidney disease)   . Complication of anesthesia 1966   "problem w/ether"  . COPD (chronic obstructive pulmonary disease) ([place])   . Depression   . Dysrhythmia   . GERD (gastroesophageal reflux disease)    With hiatal hernia  . GIB (gastrointestinal bleeding) 9/11   S/p EGD with cautery at [facility]  . Heart murmur    . History of blood transfusion "a few times"   "all related to anemia" (06/24/2015)  . History of hiatal hernia   . Hyperlipidemia   . Hypertension   . Mitral regurgitation    3+ MR by intraoperative TEE;  s/p MV repair with Dr. Roxy Manns 7/12  . Myocardial infarction [facility]) 2012 "several"  . On home oxygen therapy    "2 liters at night; negative for sleep apnea"  . PAD (peripheral artery disease) ([facility])    Severe; s/p bilateral renal artery stents, moderate in-stent restenosis  . Pancreatitis 05/2018  . Paroxysmal atrial fibrillation ([facility])    coumadin;  echo 9/07: EF 60%, mild LVH;  s/p Cox Maze 7/12 with LAA clipping  . Subclavian artery stenosis, left ([facility])    stented by Dr. Trula Slade on 10/18 to help flow of her LIMA to LAD  . Type II diabetes mellitus ([place])    Past Surgical History  Past Surgical History:  Procedure Laterality Date  . A/V FISTULAGRAM Left 03/21/2018   Procedure: A/V FISTULAGRAM;  Surgeon: Serafina Mitchell, MD;  Location: [place] [facility];  Service: Cardiovascular;  Laterality: Left;  .  ABDOMINAL AORTIC ENDOVASCULAR STENT GRAFT N/A 01/17/2018   Procedure: ABDOMINAL AORTIC ENDOVASCULAR STENT GRAFT WITH CO2;  Surgeon: Serafina Mitchell, MD;  Location: [place];  Service: Vascular;  Laterality: N/A;  . ABDOMINAL HYSTERECTOMY  1990's  . APPENDECTOMY  Aug. 11, 2016   Ruptured  . AV FISTULA PLACEMENT Left 11/03/2017   Procedure: ARTERIOVENOUS (AV) FISTULA CREATION LEFT ARM;  Surgeon: Serafina Mitchell, MD;  Location: [place];  Service: Vascular;  Laterality: Left;  . BOWEL RESECTION  2013  . CARDIAC CATHETERIZATION N/A 06/24/2015   Procedure: Left Heart Cath and Cors/Grafts Angiography;  Surgeon: Leonie Man, MD;  Location: [place] [facility];  Service: Cardiovascular;  Laterality: N/A;  . CARDIAC CATHETERIZATION N/A 06/25/2015   Procedure: Coronary Stent Intervention;  Surgeon: Sherren Mocha, MD;  Location: [place] [facility];  Service: Cardiovascular;  Laterality: N/A;  .  CAROTID ENDARTERECTOMY Left   . CATARACT EXTRACTION Right 03/27/2018  . CHOLECYSTECTOMY N/A 04/19/2018   Procedure: LAPAROSCOPIC CHOLECYSTECTOMY WITH INTRAOPERATIVE CHOLANGIOGRAM;  Surgeon: Rolm Bookbinder, MD;  Location: [place];  Service: General;  Laterality: N/A;  . CORONARY ANGIOPLASTY    . CORONARY ARTERY BYPASS GRAFT  03/05/2011   CABG X3 (LIMA to LAD, SVG to OM, SVG to PDA, EVH via left thigh  . CORONARY STENT PLACEMENT    . ESOPHAGOGASTRODUODENOSCOPY N/A 04/12/2018   Procedure: ESOPHAGOGASTRODUODENOSCOPY (EGD);  Surgeon: [name], MD;  Location: [facility] ENDOS[facility];  Service: Endoscopy;  Laterality: N/A;  . ESOPHAGOGASTRODUODENOSCOPY N/A 06/16/2018   Procedure: ESOPHAGOGASTRODUODENOSCOPY (EGD);  Surgeon: Thornton Park, MD;  Location: [place];  Service: Gastroenterology;  Laterality: N/A;  . IR RADIOLOGIST EVAL & MGMT  09/13/2019  . IR RADIOLOGIST EVAL & MGMT  10/31/2019  . IR RADIOLOGIST EVAL & MGMT  11/14/2019  . IR RADIOLOGIST EVAL & MGMT  01/02/2020  . LACERATION REPAIR Right 1990's   WRIST  . MAZE  03/05/2011   complete biatrial lesion set with clipping of LA appendage  . MITRAL VALVE REPAIR  03/05/2011   35mm Memo 3D ring annuloplasty for ischemic MR  . PERIPHERAL VASCULAR BALLOON ANGIOPLASTY Left 03/21/2018   Procedure: PERIPHERAL VASCULAR BALLOON ANGIOPLASTY;  Surgeon: Serafina Mitchell, MD;  Location: [place] [facility];  Service: Cardiovascular;  Laterality: Left;  . PERIPHERAL VASCULAR CATHETERIZATION N/A 06/24/2015   Procedure: Aortic Arch Angiography;  Surgeon: Serafina Mitchell, MD;  Location: [place] [facility];  Service: Cardiovascular;  Laterality: N/A;  . PERIPHERAL VASCULAR CATHETERIZATION  06/24/2015   Procedure: Peripheral Vascular Intervention;  Surgeon: Serafina Mitchell, MD;  Location: [place] [facility];  Service: Cardiovascular;;  . PERIPHERAL VASCULAR CATHETERIZATION N/A 06/24/2015   Procedure: Abdominal Aortogram;  Surgeon: Serafina Mitchell, MD;  Location: [place] [facility];  Service: Cardiovascular;  Laterality: N/A;  . RENAL ANGIOGRAM Bilateral 09/11/2013   Procedure: RENAL ANGIOGRAM;  Surgeon: Serafina Mitchell, MD;  Location: [facility] CATH LAB;  Service: Cardiovascular;  Laterality: Bilateral;  . RIGHT FEMORAL-POPLITEAL BYPASS     Family History  Family History  Problem Relation Age of Onset  . Emphysema Mother   . Heart disease Mother        before age 25  . Hypertension Mother   . Hyperlipidemia Mother   . Heart attack Mother   . AAA (abdominal aortic aneurysm) Mother        rupture  . Heart attack Father 66  . Emphysema Father   . Heart disease Father  before age 70  . Hyperlipidemia Father   . Hypertension Father   . Peripheral vascular disease Father   . Diabetes Brother   . Heart disease Brother        before age 102  . Hyperlipidemia Brother   . Hypertension Brother   . Heart attack Brother        CABG  . Heart disease Other        Vascular disease in grandparents, uncles and dad  . Colon cancer Neg Hx   . Esophageal cancer Neg Hx   . Inflammatory bowel disease Neg Hx   . Liver disease Neg Hx   . Pancreatic cancer Neg Hx   . Rectal cancer Neg Hx   . Stomach cancer Neg Hx    Social History  reports that she has been smoking cigarettes. She has a 37.50 pack-year smoking history. She has never used smokeless tobacco. She reports current alcohol use. She reports current drug use. Drug: Marijuana. Allergies  Allergies  Allergen Reactions  . Isosorbide Other (See Comments)    Can only tolerate in low doses (unknown reaction) Other reaction(s): Other Unknown "FELT LIKE I WAS GOING TO EXPLODE FROM INSIDE OUT"  . Codeine Itching  . Contrast Media [Iodinated Diagnostic Agents] Itching and Other (See Comments)    Itching of feet  . Ioxaglate Itching and Other (See Comments)    Itching of feet  . Isosorbide Nitrate Other (See Comments)    Unknown   . Metrizamide Itching and Other (See Comments)    Itching of feet  .  Oxycodone-Acetaminophen Itching  . Percocet [Oxycodone-Acetaminophen] Itching and Other (See Comments)    Tolerates Hydrocodone   Home medications Prior to Admission medications   Medication Sig Start Date End Date Taking? Authorizing Provider  albuterol (VENTOLIN HFA) 108 (90 Base) MCG/ACT inhaler Inhale 1-2 puffs into the lungs every 6 (six) hours as needed for wheezing or shortness of breath.   Yes [provider]  allopurinol (ZYLOPRIM) 100 MG tablet Take 100 mg by mouth at bedtime.  02/02/17  Yes [provider]  amLODipine (NORVASC) 10 MG tablet Take 10 mg by mouth at bedtime. 11/20/19  Yes [provider]  atorvastatin (LIPITOR) 40 MG tablet Take 40 mg by mouth at bedtime.  12/07/17  Yes [provider]  B Complex-C-Zn-Folic Acid (DIALYVITE 381-OFBP 15) 0.8 MG TABS Take 1 tablet by mouth at bedtime.  08/09/19  Yes [provider]  carvedilol (COREG) 25 MG tablet Take 25 mg by mouth 2 (two) times daily. 04/16/19  Yes [provider]  clopidogrel (PLAVIX) 75 MG tablet Take 75 mg by mouth at bedtime.  10/02/19  Yes [provider]  diphenhydrAMINE (BENADRYL) 25 MG tablet Take 50 mg by mouth at bedtime.   Yes [provider]  gabapentin (NEURONTIN) 300 MG capsule Take 1 capsule (300 mg total) by mouth at bedtime. 08/22/18  Yes Mikhail, Velta Addison, DO  hydrALAZINE (APRESOLINE) 25 MG tablet Take 1 tablet (25 mg total) by mouth every 8 (eight) hours. Patient taking differently: Take 25 mg by mouth at bedtime.  06/26/18  Yes Thurnell Lose, MD  HYDROmorphone (DILAUDID) 2 MG tablet Take 2-4 mg by mouth every 4 (four) hours.    Yes [provider]  pantoprazole (PROTONIX) 40 MG tablet Take 1 tablet (40 mg total) by mouth daily. Patient taking differently: Take 40 mg by mouth at bedtime.  08/03/18  Yes Patrecia Pour, MD  promethazine (PHENERGAN) 12.5 MG  tablet Take 12.5 mg by mouth every 4 (four) hours as needed for nausea or  vomiting.   Yes [provider]  traZODone (DESYREL) 100 MG tablet Take 150 mg by mouth at bedtime.  04/19/19  Yes [provider]  OXYGEN Inhale 2 L into the lungs at bedtime.     [provider]     Vitals:   01/22/20 0700 01/22/20 0730 01/22/20 0830 01/22/20 0900  BP: (!) 133/49 138/60 (!) 145/60 (!) 130/55  Pulse: (!) 54 (!) 57 62 60  Resp: 13 13  16   Temp:      TempSrc:      SpO2: 97% 96% 97% 96%   Exam Gen alert, chron ill appearing, no distress No rash, cyanosis or gangrene Sclera anicteric, throat clear  No jvd or bruits Chest decreased air movement throughout bilat lung fields, no wheezing or rales, no ^wob RRR no MRG Abd soft ntnd no mass or ascites +bs GU defer MS no joint effusions or deformity Ext 1-2+ bilat pretib edema, no dependent / UE edema Neuro is alert, Ox 3 , nf LUA AVF +pulsation but no bruit    Home meds:  - norvasc 10/ coreg 25 bid/ plavix 75/ hydralazine 25 tid/ lipitor 40  - trazodone 150/ dilaudid 2-4mg  po q 4h/ neurontin 300 hs  - PPI/ allopurinol/ creon ac tid  - home O2 2L hs   Outpt HD: TTS   3.5h  400/800  edw pend  LUA AVF 16ga Hep 2200   Assessment/ Plan: 1. Clotted AVF - IR consulted. Pt does have dye allergy, not sure severity.  2. ESRD - missed last 2 sessions. Labs good. Plan HD today or tomorrow after access established. K+ low 3. HTN/ vol - cont meds, has some LE edema, vol ^.  4. Depression 5. Chronic abd pain - per primary team 6. Gout 7. Anemia ckd - Hb 10.5, follow 8. MBC ckd - Ca ok, get records for meds      Kelly Splinter  MD 01/22/2020, 10:14 AM  Recent Labs  Lab 01/19/20 2020 01/21/20 2157  WBC 6.8 6.1  HGB 9.6* 10.5*   Recent Labs  Lab 01/19/20 2020 01/21/20 2157  K 3.2* 3.4*  BUN 27* 34*  CREATININE 2.39* 2.81*  CALCIUM 7.9* 8.0*

## 2020-01-22 NOTE — ED Notes (Signed)
Attempted report x1. 

## 2020-01-22 NOTE — ED Provider Notes (Signed)
[facility] EMERGENCY DEPARTMENT Provider Note   CSN: [id number] Arrival date & time: 01/21/20  2102     History Chief Complaint  Patient presents with  . Abdominal Pain    [name] is a 69 y.o. female with a history of ESRD on hemodialysis T/R/S, COPD, chronic pancreatitis, CHF, diabetes mellitus type 2, and PAD who presents to the emergency department with a chief complaint of abdominal pain.  The patient reports that over the last few days that she began having upper abdominal pain in the right flank that radiates around her right mid back and right upper abdomen.  She states that she has had this pain many times before and it is consistent with pancreatitis.  States that this feels similar to her chronic pain.  She has been nauseated, but denies vomiting.  She has been taking 4 mg of Dilaudid every 3 hours for pain at home.  Patient was seen for the same at [facility] on 5/15 and was admitted to be evaluated by nephrology and vascular surgery, but left AMA due to being displeased with the pain regimen she was offered for admission.  Patient also reports that she needs to have her AV fistula assessed while she is in the ER as it clotted at dialysis on Thursday, 5/13.  She has not been dialyzed for 1 week.  Reports that paperwork was faxed over last Thursday, but she has not heard back since that time.  She denies fever, chills, shortness of breath, cough, chest pain, hematemesis, melena, hematochezia, dysuria, hematuria, urinary frequency or hesitancy.  She still makes some urine.  She reports that her legs have been more swollen since she missed dialysis and she noticed some swelling in her left upper arm over the last 24 hours.  The history is provided by the patient and medical records. No language interpreter was used.       Past Medical History:  Diagnosis Date  . AAA (abdominal aortic aneurysm) ([place]) 5/09   3.7x3.3 by u/s 2009  . Abnormal EKG      deep TW inversions chronic  . Anemia   . CAD (coronary artery disease)    a. CABG 03/2011 LIMA to LAD, SVG to OM, SVG to PDA. b. cath 06/25/2015 DES to SVG to rPDA, patent LIMA to LAD, patent SVG to OM  . carotid stenosis 5/09   S/p L CEA ;  60-79% bilat ICA by preCABG dopplers 7/12  . CHF (congestive heart failure) ([place])   . Chronic back pain    "all over" (06/24/2015)  . Chronic bronchitis ([place])    "get it pretty much q yr" (06/24/2015)  . CKD (chronic kidney disease)   . Complication of anesthesia 1966   "problem w/ether"  . COPD (chronic obstructive pulmonary disease) ([place])   . Depression   . Dysrhythmia   . GERD (gastroesophageal reflux disease)    With hiatal hernia  . GIB (gastrointestinal bleeding) 9/11   S/p EGD with cautery at [facility]  . Heart murmur   . History of blood transfusion "a few times"   "all related to anemia" (06/24/2015)  . History of hiatal hernia   . Hyperlipidemia   . Hypertension   . Mitral regurgitation    3+ MR by intraoperative TEE;  s/p MV repair with Dr. Roxy Manns 7/12  . Myocardial infarction [facility]) 2012 "several"  . On home oxygen therapy    "2 liters at night; negative for sleep apnea"  . PAD (  peripheral artery disease) (HCC)    Severe; s/p bilateral renal artery stents, moderate in-stent restenosis  . Pancreatitis 05/2018  . Paroxysmal atrial fibrillation (HCC)    coumadin;  echo 9/07: EF 60%, mild LVH;  s/p Cox Maze 7/12 with LAA clipping  . Subclavian artery stenosis, left (HCC)    stented by Dr. Trula Slade on 10/18 to help flow of her LIMA to LAD  . Type II diabetes mellitus [facility])     Patient Active Problem List   Diagnosis Date Noted  . AV fistula thrombosis, initial encounter ([place]) 01/19/2020  . AF (paroxysmal atrial fibrillation) ([place]) 01/19/2020  . Chronic respiratory failure with hypoxia ([place]) 01/19/2020  . Acute pancreatitis 07/26/2019  . Acute on chronic pancreatitis ([place]) 05/12/2019  . Abnormal CT of the abdomen 09/25/2018  .  Pancreatic divisum 09/25/2018  . Pancreatic mass   . ESRD on hemodialysis ([place]) 09/09/2018  . Renal failure (ARF), acute on chronic (HCC) 08/15/2018  . ARF (acute renal failure) ([place]) 08/15/2018  . CKD (chronic kidney disease), stage V ([place]) 08/15/2018  . Hypomagnesemia 08/15/2018  . Acute pulmonary edema ([place]) 08/14/2018  . Goals of care, counseling/discussion   . DNR (do not resuscitate) discussion   . AKI (acute kidney injury) ([place]) 07/26/2018  . Acute encephalopathy 07/24/2018  . Chronic pain 07/24/2018  . Malnutrition of moderate degree 07/13/2018  . Stage II pressure ulcer ([place]) 07/12/2018  . Fluid collection at surgical site   . Intraabdominal fluid collection   . Cystic duct calculus   . RUQ pain 07/08/2018  . Peripancreatic fluid collection 07/04/2018  . 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 Urinary retention 07/04/2018  . S/P insertion of iliac artery stent 07/04/2018  . Endoleak post (EVAR) endovascular aneurysm repair ([place]) 07/04/2018  . Physical deconditioning   . Esophageal ulcer with bleeding   . Pressure injury of skin 06/16/2018  . Hematemesis   . Palliative care patient   . Palliative care by specialist   . Anasarca 06/07/2018  . Demand ischemia ([place]) 06/07/2018  . Protein-calorie malnutrition, severe 05/29/2018  . Type II diabetes mellitus with renal manifestations ([place]) 05/28/2018  . Recurrent pancreatitis 05/28/2018  . Hypokalemia 05/28/2018  . SIRS (systemic inflammatory response syndrome) ([place]) 05/28/2018  . Acute recurrent pancreatitis 05/28/2018  . Volume overload 04/17/2018  . Anemia due to GI blood loss 04/14/2018  . Acute gallstone pancreatitis 04/14/2018  . Acute cholecystitis 04/14/2018  . Gastrointestinal hemorrhage   . Fatigue associated with anemia 04/09/2018  . Hypoglycemia 01/19/2018  . Type II diabetes mellitus ([place]) 01/19/2018  . Nausea with vomiting 01/19/2018  . Uncontrolled hypertension 01/19/2018  . Hyperlipidemia 01/19/2018    . CAD (coronary artery disease) 01/19/2018  . Chronic kidney disease (CKD), stage IV (severe) ([place]) 01/19/2018  . Retroperitoneal hematoma 01/19/2018  . Acute blood loss anemia 01/19/2018  . Chronic diastolic congestive heart failure ([place]) 01/19/2018  . AAA (abdominal aortic aneurysm) ([place]) 01/16/2018  . Elevated brain natriuretic peptide (BNP) level 10/07/2015  . Diabetes mellitus ([place]) 10/07/2015  . Pain in the chest   . PAD (peripheral artery disease) ([place]) 07/29/2015  . Chest pain 07/04/2015  . Acute renal failure superimposed on stage 4 chronic kidney disease ([place]) 07/04/2015  . GERD (gastroesophageal reflux disease) 07/04/2015  . Anemia 06/26/2015  . Coronary artery disease involving autologous vein coronary bypass graft with unstable angina pectoris ([place]) 06/24/2015  . Angina at rest [facility])   . Subclavian artery stenosis, left ([place]) 06/23/2015  . AAA (abdominal aortic aneurysm) without  rupture ([place]) 12/10/2014  . Subclavian steal syndrome 12/10/2014  . Renal artery stenosis ([place]) 09/04/2013  . Carotid artery stenosis 09/28/2012  . Mitral valve disorder 06/23/2011  . S/P CABG x 3 06/16/2011  . S/P Maze operation for atrial fibrillation 06/16/2011  . S/P MVR (mitral valve repair) 06/16/2011  . Follow-up examination following surgery 03/29/2011  . CKD (chronic kidney disease) 03/03/2011  . COPD with chronic bronchitis ([place]) 03/03/2011  . Chronic diastolic heart failure ([place]) 02/26/2011  . Hypotension 02/26/2011  . NSTEMI (non-ST elevated myocardial infarction) ([place]) 02/26/2011  . Long term (current) use of anticoagulants 12/17/2010  . OBSTRUCTIVE SLEEP APNEA 09/04/2010  . HYPOXEMIA 09/04/2010  . SNORING 08/05/2010  . Atrial fibrillation ([place]) 12/09/2009  . WEIGHT LOSS 03/28/2009  . PALPITATIONS 03/28/2009  . PVD 11/21/2008  . HYPERLIPIDEMIA-MIXED 11/20/2008  . TOBACCO ABUSE 11/20/2008  . Essential hypertension 11/20/2008  . Coronary artery disease involving native heart  with angina pectoris ([place]) 11/20/2008  . ABDOMINAL AORTIC ANEURYSM 11/20/2008    Past Surgical History:  Procedure Laterality Date  . A/V FISTULAGRAM Left 03/21/2018   Procedure: A/V FISTULAGRAM;  Surgeon: Serafina Mitchell, MD;  Location: [place] [facility];  Service: Cardiovascular;  Laterality: Left;  . ABDOMINAL AORTIC ENDOVASCULAR STENT GRAFT N/A 01/17/2018   Procedure: ABDOMINAL AORTIC ENDOVASCULAR STENT GRAFT WITH CO2;  Surgeon: Serafina Mitchell, MD;  Location: [place];  Service: Vascular;  Laterality: N/A;  . ABDOMINAL HYSTERECTOMY  1990's  . APPENDECTOMY  Aug. 11, 2016   Ruptured  . AV FISTULA PLACEMENT Left 11/03/2017   Procedure: ARTERIOVENOUS (AV) FISTULA CREATION LEFT ARM;  Surgeon: Serafina Mitchell, MD;  Location: [place];  Service: Vascular;  Laterality: Left;  . BOWEL RESECTION  2013  . CARDIAC CATHETERIZATION N/A 06/24/2015   Procedure: Left Heart Cath and Cors/Grafts Angiography;  Surgeon: Leonie Man, MD;  Location: [place] [facility];  Service: Cardiovascular;  Laterality: N/A;  . CARDIAC CATHETERIZATION N/A 06/25/2015   Procedure: Coronary Stent Intervention;  Surgeon: Sherren Mocha, MD;  Location: [place] [facility];  Service: Cardiovascular;  Laterality: N/A;  . CAROTID ENDARTERECTOMY Left   . CATARACT EXTRACTION Right 03/27/2018  . CHOLECYSTECTOMY N/A 04/19/2018   Procedure: LAPAROSCOPIC CHOLECYSTECTOMY WITH INTRAOPERATIVE CHOLANGIOGRAM;  Surgeon: Rolm Bookbinder, MD;  Location: [place];  Service: General;  Laterality: N/A;  . CORONARY ANGIOPLASTY    . CORONARY ARTERY BYPASS GRAFT  03/05/2011   CABG X3 (LIMA to LAD, SVG to OM, SVG to PDA, EVH via left thigh  . CORONARY STENT PLACEMENT    . ESOPHAGOGASTRODUODENOSCOPY N/A 04/12/2018   Procedure: ESOPHAGOGASTRODUODENOSCOPY (EGD);  Surgeon: [name], MD;  Location: [facility]COPY;  Service: Endoscopy;  Laterality: N/A;  . ESOPHAGOGASTRODUODENOSCOPY N/A 06/16/2018   Procedure: ESOPHAGOGASTRODUODENOSCOPY (EGD);   Surgeon: Thornton Park, MD;  Location: [place];  Service: Gastroenterology;  Laterality: N/A;  . IR RADIOLOGIST EVAL & MGMT  09/13/2019  . IR RADIOLOGIST EVAL & MGMT  10/31/2019  . IR RADIOLOGIST EVAL & MGMT  11/14/2019  . IR RADIOLOGIST EVAL & MGMT  01/02/2020  . LACERATION REPAIR Right 1990's   WRIST  . MAZE  03/05/2011   complete biatrial lesion set with clipping of LA appendage  . MITRAL VALVE REPAIR  03/05/2011   57mm Memo 3D ring annuloplasty for ischemic MR  . PERIPHERAL VASCULAR BALLOON ANGIOPLASTY Left 03/21/2018   Procedure: PERIPHERAL VASCULAR BALLOON ANGIOPLASTY;  Surgeon: Serafina Mitchell, MD;  Location: [place] [facility];  Service: Cardiovascular;  Laterality:  Left;  . PERIPHERAL VASCULAR CATHETERIZATION N/A 06/24/2015   Procedure: Aortic Arch Angiography;  Surgeon: Serafina Mitchell, MD;  Location: [place] [facility];  Service: Cardiovascular;  Laterality: N/A;  . PERIPHERAL VASCULAR CATHETERIZATION  06/24/2015   Procedure: Peripheral Vascular Intervention;  Surgeon: Serafina Mitchell, MD;  Location: [place] [facility];  Service: Cardiovascular;;  . PERIPHERAL VASCULAR CATHETERIZATION N/A 06/24/2015   Procedure: Abdominal Aortogram;  Surgeon: Serafina Mitchell, MD;  Location: [place] [facility];  Service: Cardiovascular;  Laterality: N/A;  . RENAL ANGIOGRAM Bilateral 09/11/2013   Procedure: RENAL ANGIOGRAM;  Surgeon: Serafina Mitchell, MD;  Location[facility] CATH LAB;  Service: Cardiovascular;  Laterality: Bilateral;  . RIGHT FEMORAL-POPLITEAL BYPASS       OB History   No obstetric history on file.     Family History  Problem Relation Age of Onset  . Emphysema Mother   . Heart disease Mother        before age 16  . Hypertension Mother   . Hyperlipidemia Mother   . Heart attack Mother   . AAA (abdominal aortic aneurysm) Mother        rupture  . Heart attack Father 6  . Emphysema Father   . Heart disease Father        before age 2  . Hyperlipidemia Father   .  Hypertension Father   . Peripheral vascular disease Father   . Diabetes Brother   . Heart disease Brother        before age 67  . Hyperlipidemia Brother   . Hypertension Brother   . Heart attack Brother        CABG  . Heart disease Other        Vascular disease in grandparents, uncles and dad  . Colon cancer Neg Hx   . Esophageal cancer Neg Hx   . Inflammatory bowel disease Neg Hx   . Liver disease Neg Hx   . Pancreatic cancer Neg Hx   . Rectal cancer Neg Hx   . Stomach cancer Neg Hx     Social History   Tobacco Use  . Smoking status: Current Every Day Smoker    Packs/day: 0.75    Years: 50.00    Pack years: 37.50    Types: Cigarettes  . Smokeless tobacco: Never Used  . Tobacco comment: 3/4 pk per day  Substance Use Topics  . Alcohol use: Yes    Alcohol/week: 0.0 standard drinks  . Drug use: Yes    Types: Marijuana    Home Medications Prior to Admission medications   Medication Sig Start Date End Date Taking? Authorizing Provider  albuterol (VENTOLIN HFA) 108 (90 Base) MCG/ACT inhaler Inhale 1-2 puffs into the lungs every 6 (six) hours as needed for wheezing or shortness of breath.   Yes [provider]  allopurinol (ZYLOPRIM) 100 MG tablet Take 100 mg by mouth at bedtime.  02/02/17  Yes [provider]  amLODipine (NORVASC) 10 MG tablet Take 10 mg by mouth at bedtime. 11/20/19  Yes [provider]  atorvastatin (LIPITOR) 40 MG tablet Take 40 mg by mouth at bedtime.  12/07/17  Yes [provider]  B Complex-C-Zn-Folic Acid (DIALYVITE 297-LGXQ 15) 0.8 MG TABS Take 1 tablet by mouth at bedtime.  08/09/19  Yes [provider]  carvedilol (COREG) 25 MG tablet Take 25 mg by mouth 2 (two) times daily. 04/16/19  Yes [provider]  clopidogrel (PLAVIX) 75 MG tablet Take 75  mg by mouth at bedtime.  10/02/19  Yes [provider]  diphenhydrAMINE (BENADRYL) 25 MG tablet Take 50 mg by mouth at bedtime.   Yes [provider]  gabapentin (NEURONTIN) 300 MG capsule Take 1 capsule (300 mg total) by mouth at bedtime. 08/22/18  Yes Mikhail, Velta Addison, DO  hydrALAZINE (APRESOLINE) 25 MG tablet Take 1 tablet (25 mg total) by mouth every 8 (eight) hours. Patient taking differently: Take 25 mg by mouth at bedtime.  06/26/18  Yes Thurnell Lose, MD  HYDROmorphone (DILAUDID) 2 MG tablet Take 2-4 mg by mouth every 4 (four) hours.    Yes [provider]  pantoprazole (PROTONIX) 40 MG tablet Take 1 tablet (40 mg total) by mouth daily. Patient taking differently: Take 40 mg by mouth at bedtime.  08/03/18  Yes Patrecia Pour, MD  promethazine (PHENERGAN) 12.5 MG tablet Take 12.5 mg by mouth every 4 (four) hours as needed for nausea or vomiting.   Yes [provider]  traZODone (DESYREL) 100 MG tablet Take 150 mg by mouth at bedtime.  04/19/19  Yes [provider]  amLODipine (NORVASC) 5 MG tablet Take 1 tablet (5 mg total) by mouth daily. Patient not taking: Reported on 01/22/2020 08/23/18   Cristal Ford, DO  CREON 12000 units CPEP capsule Take 2 capsules (24,000 Units total) by mouth 3 (three) times daily before meals. Patient not taking: Reported on 01/19/2020 05/18/19   Regalado, Jerald Kief A, MD  OXYGEN Inhale 2 L into the lungs at bedtime.     [provider]    Allergies    Isosorbide, Codeine, Contrast media [iodinated diagnostic agents], Ioxaglate, Isosorbide nitrate, Metrizamide, Oxycodone-acetaminophen, and Percocet [oxycodone-acetaminophen]  Review of Systems   Review of Systems  Constitutional: Negative for activity change, chills and fever.  Respiratory: Negative for shortness of breath.   Cardiovascular: Negative for chest pain.  Gastrointestinal: Positive for abdominal pain and nausea. Negative for blood in stool, diarrhea and vomiting.  Genitourinary: Positive for flank pain. Negative for dysuria, hematuria, pelvic pain and vaginal bleeding.  Musculoskeletal:  Negative for back pain.  Skin: Negative for rash.  Allergic/Immunologic: Negative for immunocompromised state.  Neurological: Negative for weakness, numbness and headaches.  Psychiatric/Behavioral: Negative for confusion.    Physical Exam Updated Vital Signs BP (!) 145/60   Pulse 62   Temp 98.1 F (36.7 C) (Oral)   Resp 13   SpO2 97%   Physical Exam Vitals and nursing note reviewed.  Constitutional:      General: She is not in acute distress.    Appearance: She is not ill-appearing, toxic-appearing or diaphoretic.     Comments: Chronically ill-appearing.  No acute distress.  HENT:     Head: Normocephalic.  Eyes:     Conjunctiva/sclera: Conjunctivae normal.  Cardiovascular:     Rate and Rhythm: Normal rate and regular rhythm.     Pulses: Normal pulses.     Heart sounds: Normal heart sounds. No murmur. No friction rub. No gallop.   Pulmonary:     Effort: Pulmonary effort is normal. No respiratory distress.     Breath sounds: No stridor. No wheezing, rhonchi or rales.     Comments: Lungs are clear to auscultation bilaterally. Chest:     Chest wall: No tenderness.  Abdominal:     General: There is no distension.     Palpations: Abdomen is soft. There is no mass.     Tenderness: There is abdominal tenderness. There is right CVA tenderness.  There is no left CVA tenderness, guarding or rebound.     Hernia: No hernia is present.     Comments: Tender to palpation in the right upper quadrant.  Negative Murphy sign.  She has right CVA tenderness.  Mild epigastric tenderness.  No rebound or guarding.  No left CVA tenderness.  Abdomen is soft and nondistended.  Musculoskeletal:     Cervical back: Neck supple.     Right lower leg: Edema present.     Left lower leg: Edema present.     Comments: Pitting edema noted in the bilateral lower legs.  Skin:    General: Skin is warm.     Findings: No rash.  Neurological:     Mental Status: She is alert.  Psychiatric:        Behavior:  Behavior normal.     ED Results / Procedures / Treatments   Labs (all labs ordered are listed, but only abnormal results are displayed) Labs Reviewed  COMPREHENSIVE METABOLIC PANEL - Abnormal; Notable for the following components:      Result Value   Potassium 3.4 (*)    Glucose, Bld 146 (*)    BUN 34 (*)    Creatinine, Ser 2.81 (*)    Calcium 8.0 (*)    Total Protein 4.8 (*)    Albumin 1.9 (*)    Alkaline Phosphatase 216 (*)    GFR calc non Af Amer 17 (*)    GFR calc Af Amer 19 (*)    All other components within normal limits  CBC - Abnormal; Notable for the following components:   RBC 3.10 (*)    Hemoglobin 10.5 (*)    HCT 32.0 (*)    MCV 103.2 (*)    RDW 17.8 (*)    All other components within normal limits  URINALYSIS, ROUTINE W REFLEX MICROSCOPIC - Abnormal; Notable for the following components:   APPearance CLOUDY (*)    Hgb urine dipstick SMALL (*)    Protein, ur 30 (*)    Leukocytes,Ua LARGE (*)    WBC, UA >50 (*)    Bacteria, UA FEW (*)    Non Squamous Epithelial 0-5 (*)    All other components within normal limits  URINE CULTURE  LIPASE, BLOOD    EKG None  Radiology DG Chest Portable 1 View  Result Date: 01/22/2020 CLINICAL DATA:  Leg swelling and shortness of breath. Last dialysis was 1 week ago. EXAM: PORTABLE CHEST 1 VIEW COMPARISON:  01/19/2020 FINDINGS: The cardiac silhouette, mediastinal and hilar contours are within normal limits and stable. Stable surgical changes from bypass surgery, valve replacement surgery and left atrial appendage closure device. Coronary artery stents are also noted. Left subclavian stents are noted also. There is tortuosity and calcification of the thoracic aorta. The lungs are clear. No edema, infiltrates or effusions. No worrisome pulmonary lesions. The bony thorax is intact. Remote healed right rib fractures are noted. IMPRESSION: No acute cardiopulmonary findings. Electronically Signed   By: Marijo Sanes M.D.   On:  01/22/2020 05:39    Procedures Procedures (including critical care time)  Medications Ordered in ED Medications  sodium chloride flush (NS) 0.9 % injection 3 mL (has no administration in time range)  HYDROmorphone (DILAUDID) injection 1 mg (1 mg Intravenous Given 01/22/20 0746)  cefTRIAXone (ROCEPHIN) 1 g in sodium chloride 0.9 % 100 mL IVPB (0 g Intravenous Stopped 01/22/20 0834)    ED Course  I have reviewed the triage vital signs and the nursing  notes.  Pertinent labs & imaging results that were available during my care of the patient were reviewed by me and considered in my medical decision making (see chart for details).  Clinical Course as of Jan 21 845  Tue Jan 22, 2020  0530 Spoke with Dr. Carlis Abbott with vascular surgery.  Based on algorithm for dialysis access, vascular surgery would be third line.  He would recommend consulting IR or nephrology to see if the patient could be seen at [facility] today.   [MM]  [phone] Spoke with Dr. Joelyn Oms, nephrology.  Discussed patient's labs.  No electrolyte abnormalities.  Chest x-ray is clear.  She does not require emergent dialysis.  He will attempt to get the patient seen at [facility] today to ensure that she has dialysis access.  If not, we will need to consult IR.  Ultimately, we will need to ensure that the patient will be able to be dialyzed today since she has not been dialyzed for 1 week as part of her disposition.   [MM]    Clinical Course User Index [MM] Orene Abbasi, Laymond Purser, PA-C   MDM Rules/Calculators/A&P                      69 year old female with a history of ESRD on hemodialysis T/R/S, COPD, chronic pancreatitis, CHF, diabetes mellitus type 2, and PAD who presents to the emergency department with right flank pain, chronic abdominal pain, and chronic nausea.  She also reports that her AV fistula clotted and she has not been able to be dialyzed for a week.  Last dialysis was 5/11.  She does not appear significantly volume overloaded.   Chest x-ray is unremarkable.  She does have pitting edema noted to her bilateral lower legs.  Creatinine 2.81.  Potassium is 3.4.  Urinalysis is concerning for infection.  Urine culture sent.  Labs are otherwise reassuring, including lipase which is normal.  Doubt acute pancreatitis.  The patient was seen and independently evaluated by Dr. Leonides Schanz, attending physician.  I suspect that her pain may be exacerbated by UTI versus pyelonephritis, but she is having no constitutional symptoms.  Rocephin given.  Will give 1 dose of Dilaudid.  Based on her labs, she does not need emergent dialysis, but there is concern about her vascular access being clotted.  Initially spoke with vascular surgery and Dr. Carlis Abbott referred be to IR versus speaking with nephrology to get the patient seen at [facility].   Spoke with Dr. Joelyn Oms who will discuss with [facility] to see if the patient can be seen today.  However, later heard back and they were unable to evaluate her in the outpatient setting.  He recommends consult to vascular or IR.  Since I have already spoken with vascular surgery, IR was consulted.  Spoke with Dr. Earleen Newport with IR.  The IR team will come and evaluate the patient in the ER.  Once patient has dialysis access available, we will also need to coordinate with nephrology to determine how the patient will receive dialysis.  Will need to speak with a.m. nephrology team for further disposition planning.  Patient care transferred to [facility] at the end of my shift. Patient presentation, ED course, and plan of care discussed with review of all pertinent labs and imaging. Please see his/her note for further details regarding further ED course and disposition.  Final Clinical Impression(s) / ED Diagnoses Final diagnoses:  Clotted renal dialysis arteriovenous graft ([place])    Rx /  DC Orders ED Discharge Orders    None       Joanne Gavel, PA-C 01/22/20 [place], Delice Bison, DO 01/23/20 [phone]

## 2020-01-22 NOTE — ED Notes (Signed)
Attempted report x 2 

## 2020-01-22 NOTE — Evaluation (Signed)
Physical Therapy Evaluation Patient Details Name: [name] MRN: [id number] DOB: [birth year] Today's Date: 01/22/2020   History of Present Illness  68yo female preseting to the ED with acute on chronic abdominal pain, c/o reduced pain and appetite and general weakness since having pancreas nerve block on 12/24/19. Has missed HD since 01/15/20 due to partially blocked AV fistula. Admitted for AV fistula occlusion, acute on chronic pancreatitis, UTI. PMH AAA, CAD, CHF, chronic pain, CKD, COPD, HLD, HTN, MI, nocturnal O2, A-fib, MV repair, DM, CABG 2012  Clinical Impression   Patient received in bed, pleasant and willing to participate in session today. See below for mobility/assist levels. Generally able to mobilize with min guard, but very easily fatigued. Did not need external assist for balance beyond min guard with mobility today however reports recent falls and close calls with falling. Discussed safety concerns at home and recommended that patient use 4-WW more often and even plan on staying with family/having family stay with her short term until she gets a bit stronger/better balanced, alternative would be SNF. Patient agreeable to all education. Left in bed with all needs met, RN aware of patient status.     Follow Up Recommendations Home health PT;Supervision/Assistance - 24 hour(may be able to stay with daughter short term; if family cannot provide appropriate levels of S/assist, would recommend SNF)    Equipment Recommendations  Other (comment)(tub bench)    Recommendations for Other Services       Precautions / Restrictions Precautions Precautions: Fall Precaution Comments: chronic pain Restrictions Weight Bearing Restrictions: No      Mobility  Bed Mobility Overal bed mobility: Needs Assistance Bed Mobility: Supine to Sit;Sit to Supine     Supine to sit: Supervision Sit to supine: Supervision   General bed mobility comments: extended time due to  weakness  Transfers Overall transfer level: Needs assistance Equipment used: None Transfers: Sit to/from Stand Sit to Stand: Min guard         General transfer comment: mildly dizzy in standing, improved with extended time; able to march in place without significant assist to maintain balance  Ambulation/Gait Ambulation/Gait assistance: Min guard Gait Distance (Feet): 200 Feet Assistive device: None Gait Pattern/deviations: Step-through pattern;Decreased dorsiflexion - right;Decreased dorsiflexion - left;Trendelenburg;Trunk flexed;Narrow base of support Gait velocity: decreased   General Gait Details: slow but steady with min guard, no need for external assistance for balance corrections but very fatigued at end of gait distance  Stairs            Wheelchair Mobility    Modified Rankin (Stroke Patients Only)       Balance Overall balance assessment: Mild deficits observed, not formally tested;History of Falls                                           Pertinent Vitals/Pain Pain Assessment: 0-10 Pain Score: 8  Pain Location: abdomen Pain Descriptors / Indicators: Aching;Sharp;Sore Pain Intervention(s): Limited activity within patient's tolerance;Monitored during session;Patient requesting pain meds-RN notified    Home Living Family/patient expects to be discharged to:: Private residence Living Arrangements: Alone Available Help at Discharge: Family;Available PRN/intermittently Type of Home: House Home Access: Stairs to enter;Ramped entrance   Entrance Stairs-Number of Steps: 4 Home Layout: One level Home Equipment: Walker - 4 wheels;Bedside commode;Transport chair;Grab bars - tub/shower      Prior Function Level of Independence: Independent  Comments: has been furnituer walkinjg a lot- the past couple of weeks she has barely been able to stand up to fix something to eat; has had a fall 3 weeks ago in between potty chair and  cabinent     Hand Dominance   Dominant Hand: Right    Extremity/Trunk Assessment   Upper Extremity Assessment Upper Extremity Assessment: Generalized weakness    Lower Extremity Assessment Lower Extremity Assessment: Generalized weakness    Cervical / Trunk Assessment Cervical / Trunk Assessment: Kyphotic  Communication   Communication: No difficulties  Cognition Arousal/Alertness: Awake/alert Behavior During Therapy: WFL for tasks assessed/performed;Flat affect Overall Cognitive Status: Within Functional Limits for tasks assessed                                        General Comments General comments (skin integrity, edema, etc.): VSS on room air    Exercises     Assessment/Plan    PT Assessment Patient needs continued PT services  PT Problem List Decreased strength;Decreased coordination;Decreased activity tolerance;Decreased balance;Decreased mobility       PT Treatment Interventions DME instruction;Therapeutic exercise;Gait training;Balance training;Stair training;Functional mobility training;Therapeutic activities;Patient/family education    PT Goals (Current goals can be found in the Care Plan section)  Acute Rehab PT Goals Patient Stated Goal: get stronger PT Goal Formulation: With patient Time For Goal Achievement: 02/05/20 Potential to Achieve Goals: Good    Frequency Min 3X/week   Barriers to discharge        Co-evaluation               AM-PAC PT "6 Clicks" Mobility  Outcome Measure Help needed turning from your back to your side while in a flat bed without using bedrails?: None Help needed moving from lying on your back to sitting on the side of a flat bed without using bedrails?: None Help needed moving to and from a bed to a chair (including a wheelchair)?: A Little Help needed standing up from a chair using your arms (e.g., wheelchair or bedside chair)?: A Little Help needed to walk in hospital room?: A Little Help  needed climbing 3-5 steps with a railing? : A Lot 6 Click Score: 19    End of Session Equipment Utilized During Treatment: Gait belt Activity Tolerance: Patient tolerated treatment well;Patient limited by fatigue Patient left: in bed;with call bell/phone within reach Nurse Communication: Mobility status PT Visit Diagnosis: Unsteadiness on feet (R26.81);History of falling (Z91.81);Muscle weakness (generalized) (M62.81)    Time: 1308-6578 PT Time Calculation (min) (ACUTE ONLY): 19 min   Charges:   PT Evaluation $PT Eval Moderate Complexity: 1 Mod          Windell Norfolk, DPT, PN1   Supplemental Physical Therapist [place]    Pager [phone] Acute Rehab Office [phone]

## 2020-01-22 NOTE — ED Provider Notes (Addendum)
07:20: Assumed care of patient from [name] at change of shift pending IR consultation & re-discussion with nephrology.   Please see prior provider note for full H&P.  Briefly patient is a 69 yo female who presented to the ED with multiple concerns. Ultimately her AV fistula is partially occluded and she was found to have findings of pyelonephritis. Case was discussed with vascular surgeon Dr. Carlis Abbott- recommended discussion with IR regarding access as well as Dr. Joelyn Oms of nephrology- plan to relay information to day shift team- patient will need to be set up. Does not appear to require emergent dialysis at this time. She is receiving rocephin for pyelonephritis.   Physical Exam  BP 132/60   Pulse (!) 59   Temp 98.1 F (36.7 C) (Oral)   Resp 14   SpO2 100%   Physical Exam Vitals and nursing note reviewed.  Constitutional:      General: She is not in acute distress.    Appearance: She is well-developed.  HENT:     Head: Normocephalic and atraumatic.  Eyes:     General:        Right eye: No discharge.        Left eye: No discharge.     Conjunctiva/sclera: Conjunctivae normal.  Neurological:     Mental Status: She is alert.     Comments: Clear speech.   Psychiatric:        Behavior: Behavior normal.        Thought Content: Thought content normal.     ED Course/Procedures   Results for orders placed or performed during the hospital encounter of 01/21/20  Lipase, blood  Result Value Ref Range   Lipase 29 11 - 51 U/L  Comprehensive metabolic panel  Result Value Ref Range   Sodium 139 135 - 145 mmol/L   Potassium 3.4 (L) 3.5 - 5.1 mmol/L   Chloride 103 98 - 111 mmol/L   CO2 24 22 - 32 mmol/L   Glucose, Bld 146 (H) 70 - 99 mg/dL   BUN 34 (H) 8 - 23 mg/dL   Creatinine, Ser 2.81 (H) 0.44 - 1.00 mg/dL   Calcium 8.0 (L) 8.9 - 10.3 mg/dL   Total Protein 4.8 (L) 6.5 - 8.1 g/dL   Albumin 1.9 (L) 3.5 - 5.0 g/dL   AST 27 15 - 41 U/L   ALT 21 0 - 44 U/L   Alkaline  Phosphatase 216 (H) 38 - 126 U/L   Total Bilirubin 0.8 0.3 - 1.2 mg/dL   GFR calc non Af Amer 17 (L) >60 mL/min   GFR calc Af Amer 19 (L) >60 mL/min   Anion gap 12 5 - 15  CBC  Result Value Ref Range   WBC 6.1 4.0 - 10.5 K/uL   RBC 3.10 (L) 3.87 - 5.11 MIL/uL   Hemoglobin 10.5 (L) 12.0 - 15.0 g/dL   HCT 32.0 (L) 36.0 - 46.0 %   MCV 103.2 (H) 80.0 - 100.0 fL   MCH 33.9 26.0 - 34.0 pg   MCHC 32.8 30.0 - 36.0 g/dL   RDW 17.8 (H) 11.5 - 15.5 %   Platelets 183 150 - 400 K/uL   nRBC 0.0 0.0 - 0.2 %  Urinalysis, Routine w reflex microscopic  Result Value Ref Range   Color, Urine YELLOW YELLOW   APPearance CLOUDY (A) CLEAR   Specific Gravity, Urine 1.018 1.005 - 1.030   pH 5.0 5.0 - 8.0   Glucose, UA NEGATIVE NEGATIVE mg/dL  Hgb urine dipstick SMALL (A) NEGATIVE   Bilirubin Urine NEGATIVE NEGATIVE   Ketones, ur NEGATIVE NEGATIVE mg/dL   Protein, ur 30 (A) NEGATIVE mg/dL   Nitrite NEGATIVE NEGATIVE   Leukocytes,Ua LARGE (A) NEGATIVE   RBC / HPF 11-20 0 - 5 RBC/hpf   WBC, UA >50 (H) 0 - 5 WBC/hpf   Bacteria, UA FEW (A) NONE SEEN   Squamous Epithelial / LPF 0-5 0 - 5   WBC Clumps PRESENT    Hyaline Casts, UA PRESENT    Non Squamous Epithelial 0-5 (A) NONE SEEN   DG Chest 1 View  Result Date: 01/19/2020 CLINICAL DATA:  Weakness EXAM: CHEST  1 VIEW COMPARISON:  08/23/2019 FINDINGS: Post sternotomy changes with valve prosthesis and appendage clip. No focal opacity or pleural effusion. Stable cardiomediastinal silhouette with aortic atherosclerosis. No pneumothorax. Left axillary vascular stent. IMPRESSION: No active disease. Electronically Signed   By: Donavan Foil M.D.   On: 01/19/2020 20:33   CT BIOPSY  Result Date: 12/24/2019 CLINICAL DATA:  History of chronic intractable abdominal pain secondary to chronic pancreatitis. Status post prior CT-guided neurolysis of the celiac plexus on 09/24/2019 which resulted in some improvement in right-sided abdominal pain. Due to persistent  midline and left-sided pain requiring ex elevated use of pain medications, a second celiac block is now performed. EXAM: CT GUIDED NEUROLYTIC ABLATION OF THE CELIAC AXIS ANESTHESIA/SEDATION: Versed 4.0 mg IV, dilaudid 2 mg IV Total Moderate Sedation Time 32 minutes. The patient's level of consciousness and physiologic status were continuously monitored during the procedure by Radiology nursing. PROCEDURE: The procedure risks, benefits, and alternatives were explained to the patient. Questions regarding the procedure were encouraged and answered. The patient understands and consents to the procedure. A time-out was performed prior to initiating the procedure. The anterior abdominal wall was prepped with chlorhexidine in a sterile fashion, and a sterile drape was applied covering the operative field. A sterile gown and sterile gloves were used for the procedure. Local anesthesia was provided with 1% Lidocaine. A 22 gauge Chiba needle was advanced under CT guidance to the level of the celiac plexus. After confirming needle tip position, approximately 4 mL of a 1:10 dilution of Omnipaque-300 contrast and sterile saline was injected. Spread of diluted contrast material was confirmed by CT. A mixture of 7 ml of 0.5% Sensorcaine and 80 mg Depo-Medrol was then made. This was injected through the needle. Alcohol ablation of the celiac plexus was then performed with injection of 15 ml of absolute alcohol. An additional 3 mL of 0.5% Sensorcaine was injected after alcohol injection. COMPLICATIONS: None FINDINGS: Contrast injection showed spread across the midline and predominantly towards the left at the level of the celiac plexus. Alcohol ablation was successfully performed. Alcohol injection was stopped when the patient did start to experience exacerbation of pain which was felt today as primarily left shoulder pain radiating into the back. Some additional Sensorcaine was injected after alcohol ablation. IMPRESSION: CT guided  neurolytic ablation of the celiac plexus as above. Alcohol ablation was performed with 15 ml of absolute alcohol. Therapeutic injection of Sensorcaine and Depo-Medrol was also performed. Electronically Signed   By: Aletta Edouard M.D.   On: 12/24/2019 13:01   DG Chest Portable 1 View  Result Date: 01/22/2020 CLINICAL DATA:  Leg swelling and shortness of breath. Last dialysis was 1 week ago. EXAM: PORTABLE CHEST 1 VIEW COMPARISON:  01/19/2020 FINDINGS: The cardiac silhouette, mediastinal and hilar contours are within normal limits and stable. Stable surgical  changes from bypass surgery, valve replacement surgery and left atrial appendage closure device. Coronary artery stents are also noted. Left subclavian stents are noted also. There is tortuosity and calcification of the thoracic aorta. The lungs are clear. No edema, infiltrates or effusions. No worrisome pulmonary lesions. The bony thorax is intact. Remote healed right rib fractures are noted. IMPRESSION: No acute cardiopulmonary findings. Electronically Signed   By: Marijo Sanes M.D.   On: 01/22/2020 05:39   IR Radiologist Eval & Mgmt  Result Date: 01/02/2020 Please refer to notes tab for details about interventional procedure. (Op Note)   Clinical Course as of Jan 21 705  Tue Jan 22, 2020  0530 Spoke with Dr. Carlis Abbott with vascular surgery.  Based on algorithm for dialysis access, vascular surgery would be third line.  He would recommend consulting IR or nephrology to see if the patient could be seen at [facility] today.   [MM]  [phone] Spoke with Dr. Joelyn Oms, nephrology.  Discussed patient's labs.  No electrolyte abnormalities.  Chest x-ray is clear.  She does not require emergent dialysis.  He will attempt to get the patient seen at [facility] today to ensure that she has dialysis access.  If not, we will need to consult IR.  Ultimately, we will need to ensure that the patient will be able to be dialyzed today since she has not been dialyzed for  1 week as part of her disposition.   [MM]    Clinical Course User Index [MM] McDonald, Mia A, [name]    Procedures MDM  IR has been consulted and is planning to work on access, given contrast dye allergy will need to be pre-medicated. We discussed with nephrologist Dr. Jonnie Finner, at this time recommends medical admission.   Patient updated on results & plan of care, she was requesting additional pain medication which has been ordered.   10:13: CONSULT: Discussed with internal medicine service- accept admission.       Leafy Kindle 01/22/20 Alden was evaluated in Emergency Department on 01/22/2020 for the symptoms described in the history of present illness. He/she was evaluated in the context of the global COVID-19 pandemic, which necessitated consideration that the patient might be at risk for infection with the SARS-CoV-2 virus that causes COVID-19. Institutional protocols and algorithms that pertain to the evaluation of patients at risk for COVID-19 are in a state of rapid change based on information released by regulatory bodies including the CDC and federal and state organizations. These policies and algorithms were followed during the patient's care in the ED.    Leafy Kindle 01/22/20 1014    Sherwood Gambler, MD 01/22/20 1306

## 2020-01-23 ENCOUNTER — Inpatient Hospital Stay (HOSPITAL_COMMUNITY): Payer: Medicare Other

## 2020-01-23 ENCOUNTER — Other Ambulatory Visit: Payer: Self-pay

## 2020-01-23 HISTORY — PX: IR US GUIDE VASC ACCESS LEFT: IMG2389

## 2020-01-23 HISTORY — PX: IR AV DIALY SHUNT INTRO NEEDLE/INTRACATH INITIAL W/PTA/IMG LEFT: IMG6103

## 2020-01-23 LAB — GLUCOSE, CAPILLARY
Glucose-Capillary: 133 mg/dL — ABNORMAL HIGH (ref 70–99)
Glucose-Capillary: 149 mg/dL — ABNORMAL HIGH (ref 70–99)
Glucose-Capillary: 180 mg/dL — ABNORMAL HIGH (ref 70–99)

## 2020-01-23 LAB — HEMOGLOBIN A1C
Hgb A1c MFr Bld: 4.8 % (ref 4.8–5.6)
Mean Plasma Glucose: 91 mg/dL

## 2020-01-23 MED ORDER — LIDOCAINE HCL (PF) 1 % IJ SOLN
INTRAMUSCULAR | Status: AC | PRN
  Administered 2020-01-23: 10 mL

## 2020-01-23 MED ORDER — RENA-VITE PO TABS
1.0000 | ORAL_TABLET | Freq: Every day | ORAL | Status: DC
  Administered 2020-01-24: 1 via ORAL
  Filled 2020-01-23: qty 1

## 2020-01-23 MED ORDER — MIDAZOLAM HCL 2 MG/2ML IJ SOLN
INTRAMUSCULAR | Status: AC | PRN
  Administered 2020-01-23 (×2): 1 mg via INTRAVENOUS

## 2020-01-23 MED ORDER — HEPARIN SODIUM (PORCINE) 1000 UNIT/ML IJ SOLN
INTRAMUSCULAR | Status: AC
  Filled 2020-01-23: qty 1

## 2020-01-23 MED ORDER — HYDROMORPHONE HCL 1 MG/ML IJ SOLN
INTRAMUSCULAR | Status: AC | PRN
  Administered 2020-01-23: 1 mg via INTRAVENOUS

## 2020-01-23 MED ORDER — BOOST / RESOURCE BREEZE PO LIQD CUSTOM
1.0000 | Freq: Three times a day (TID) | ORAL | Status: DC
  Administered 2020-01-23 – 2020-01-24 (×2): 1 via ORAL

## 2020-01-23 MED ORDER — DOXERCALCIFEROL 4 MCG/2ML IV SOLN: 1.0000 ug | INTRAVENOUS | Status: DC

## 2020-01-23 MED ORDER — ALTEPLASE 2 MG IJ SOLR
INTRAMUSCULAR | Status: AC
  Filled 2020-01-23: qty 4

## 2020-01-23 MED ORDER — PRO-STAT SUGAR FREE PO LIQD
30.0000 mL | Freq: Three times a day (TID) | ORAL | Status: DC
  Administered 2020-01-23 – 2020-01-24 (×3): 30 mL via ORAL
  Filled 2020-01-23 (×4): qty 30

## 2020-01-23 MED ORDER — MIDAZOLAM HCL 2 MG/2ML IJ SOLN
INTRAMUSCULAR | Status: AC
  Filled 2020-01-23: qty 6

## 2020-01-23 MED ORDER — HYDROMORPHONE HCL 1 MG/ML IJ SOLN
2.0000 mg | INTRAMUSCULAR | Status: DC | PRN
  Administered 2020-01-23 – 2020-01-24 (×5): 2 mg via INTRAVENOUS
  Filled 2020-01-23 (×4): qty 2

## 2020-01-23 MED ORDER — LIDOCAINE HCL 1 % IJ SOLN
INTRAMUSCULAR | Status: AC
  Filled 2020-01-23: qty 20

## 2020-01-23 MED ORDER — IOHEXOL 300 MG/ML  SOLN
100.0000 mL | Freq: Once | INTRAMUSCULAR | Status: AC | PRN
  Administered 2020-01-23: 50 mL via INTRAVENOUS

## 2020-01-23 MED ORDER — HYDROMORPHONE HCL 1 MG/ML IJ SOLN
INTRAMUSCULAR | Status: AC
  Filled 2020-01-23: qty 2

## 2020-01-23 MED ORDER — MUPIROCIN CALCIUM 2 % EX CREA
TOPICAL_CREAM | Freq: Every day | CUTANEOUS | Status: DC
  Filled 2020-01-23: qty 15

## 2020-01-23 NOTE — Evaluation (Signed)
Occupational Therapy Evaluation Patient Details Name: [name] MRN: [id number] DOB: Dec 04, 1950 Today's Date: 01/23/2020    History of Present Illness 69yo female preseting to the ED with acute on chronic abdominal pain, c/o reduced pain and appetite and general weakness since having pancreas nerve block on 12/24/19. Has missed HD since 01/15/20 due to partially blocked AV fistula. Admitted for AV fistula occlusion, acute on chronic pancreatitis, UTI. PMH AAA, CAD, CHF, chronic pain, CKD, COPD, HLD, HTN, MI, nocturnal O2, A-fib, MV repair, DM, CABG 2012   Clinical Impression   PTA pt living alone, reports she was independent for BADL with as needed assist for IADL from family. Pt was not using DME for mobility, but does endorse a fall in the past 3 weeks. At time of eval, pt completed bed mobility at supervision level and sit <> stands with min guard and rollator. Noted increased steadiness and safety with rollator. Pt was able to complete functional mobility to the sink and complete standing grooming. She then completed beyond a household distance of functional mobility, and required seated rest breaks to maintain energy. Given current status, recommend HHOT at d/c to continue to progress BADL in home environment. Will continue to follow per POC listed below.     Follow Up Recommendations  Home health OT;Supervision - Intermittent    Equipment Recommendations  3 in 1 bedside commode    Recommendations for Other Services       Precautions / Restrictions Precautions Precautions: Fall Restrictions Weight Bearing Restrictions: No      Mobility Bed Mobility Overal bed mobility: Needs Assistance Bed Mobility: Supine to Sit     Supine to sit: Supervision     General bed mobility comments: increased time and effort  Transfers Overall transfer level: Needs assistance Equipment used: 4-wheeled walker Transfers: Sit to/from Stand Sit to Stand: Min guard         General  transfer comment: slight dizziness with standing which quickly improved, increased steadying noted with rollator    Balance Overall balance assessment: Mild deficits observed, not formally tested;History of Falls                                         ADL either performed or assessed with clinical judgement   ADL Overall ADL's : Needs assistance/impaired Eating/Feeding: Set up;Sitting   Grooming: Min guard;Standing;Brushing hair Grooming Details (indicate cue type and reason): brushing hair standing at sink Upper Body Bathing: Set up;Sitting   Lower Body Bathing: Minimal assistance;Sit to/from stand;Sitting/lateral leans   Upper Body Dressing : Set up;Sitting   Lower Body Dressing: Minimal assistance;Sit to/from stand;Sitting/lateral leans   Toilet Transfer: Min guard;Comfort height toilet;Grab bars;RW Armed forces technical officer Details (indicate cue type and reason): requires BSC over toilet or comfort height for min guard level Toileting- Clothing Manipulation and Hygiene: Set up;Sitting/lateral lean;Sit to/from stand   Tub/ Shower Transfer: Min guard;Ambulation;Shower seat;Rolling walker   Functional mobility during ADLs: Min guard;Rolling walker       Vision Baseline Vision/History: Wears glasses Wears Glasses: At all times Patient Visual Report: No change from baseline       Perception     Praxis      Pertinent Vitals/Pain Pain Assessment: Faces Faces Pain Scale: Hurts little more Pain Location: abdomen Pain Descriptors / Indicators: Aching;Sharp;Sore Pain Intervention(s): Monitored during session;Repositioned;Limited activity within patient's tolerance     Hand Dominance  Extremity/Trunk Assessment Upper Extremity Assessment Upper Extremity Assessment: Generalized weakness   Lower Extremity Assessment Lower Extremity Assessment: Generalized weakness       Communication Communication Communication: No difficulties   Cognition  Arousal/Alertness: Awake/alert Behavior During Therapy: WFL for tasks assessed/performed Overall Cognitive Status: No family/caregiver present to determine baseline cognitive functioning Area of Impairment: Safety/judgement                         Safety/Judgement: Decreased awareness of safety;Decreased awareness of deficits     General Comments: has goal to drive all the way to [place] in a short period of time- overall poor awareness of safety and current deficits   General Comments       Exercises     Shoulder Instructions      Home Living Family/patient expects to be discharged to:: Private residence Living Arrangements: Alone Available Help at Discharge: Family;Available PRN/intermittently Type of Home: House Home Access: Stairs to enter;Ramped entrance Entrance Stairs-Number of Steps: 4   Home Layout: One level     Bathroom Shower/Tub: Teacher, early years/pre: Handicapped height     Home Equipment: Environmental consultant - 4 wheels;Bedside commode;Transport chair;Grab bars - tub/shower   Additional Comments: uses BSC over toilet      Prior Functioning/Environment Level of Independence: Independent        Comments: states she has been going without the rollator, but having increased unsteadiness        OT Problem List: Decreased strength;Decreased knowledge of use of DME or AE;Decreased activity tolerance;Impaired balance (sitting and/or standing)      OT Treatment/Interventions: Self-care/ADL training;Therapeutic exercise;Patient/family education;Balance training;Energy conservation;Therapeutic activities;DME and/or AE instruction;Cognitive remediation/compensation    OT Goals(Current goals can be found in the care plan section) Acute Rehab OT Goals Patient Stated Goal: get stronger OT Goal Formulation: With patient Time For Goal Achievement: 02/06/20 Potential to Achieve Goals: Good  OT Frequency: Min 2X/week   Barriers to D/C:             Co-evaluation              AM-PAC OT "6 Clicks" Daily Activity     Outcome Measure Help from another person eating meals?: None Help from another person taking care of personal grooming?: None Help from another person toileting, which includes using toliet, bedpan, or urinal?: A Little Help from another person bathing (including washing, rinsing, drying)?: A Little Help from another person to put on and taking off regular upper body clothing?: None Help from another person to put on and taking off regular lower body clothing?: A Little 6 Click Score: 21   End of Session Equipment Utilized During Treatment: Gait belt;Rolling walker Nurse Communication: Mobility status  Activity Tolerance: Patient tolerated treatment well Patient left: Other (comment)(walking in hallway with PT)  OT Visit Diagnosis: Unsteadiness on feet (R26.81);Other abnormalities of gait and mobility (R26.89);Muscle weakness (generalized) (M62.81);History of falling (Z91.81)                Time: 8937-3428 OT Time Calculation (min): 12 min Charges:  OT General Charges $OT Visit: 1 Visit OT Evaluation $OT Eval Moderate Complexity: [place], MSOT, OTR/L Acute Rehabilitation Services [facility] Office Number: [phone] Pager: [phone]  Zenovia Jarred 01/23/2020, 6:29 PM

## 2020-01-23 NOTE — TOC CAGE-AID Note (Deleted)
Transition of Care [facility]) - CAGE-AID Screening   Patient Details  Name: [name] MRN: [id number] Date of Birth: 04-09-1951  [facility]) CM/SW Contact:    Memory Argue Phone Number: 01/23/2020, 4:56 PM   Clinical Narrative:    CAGE-AID Screening:

## 2020-01-23 NOTE — Progress Notes (Addendum)
Initial Nutrition Assessment  DOCUMENTATION CODES:   Underweight, Severe malnutrition in context of chronic illness  INTERVENTION:   Recommend checking phosphrous and Vitamin C   Boost Breeze po TID, each supplement provides 250 kcal and 9 grams of protein  30 ml Prostat TID, each supplement provides 100 kcals and 15 grams protein  Renal MVI  Pt would likely benefit from long term feeding tube if within goals of care.   NUTRITION DIAGNOSIS:   Severe Malnutrition related to chronic illness(ESRD/pancreatitis) as evidenced by severe fat depletion, severe muscle depletion, energy intake < or equal to 75% for > or equal to 1 month, percent weight loss.  GOAL:   Patient will meet greater than or equal to 90% of their needs  MONITOR:   PO intake, Supplement acceptance, Diet advancement, Weight trends, Labs, I & O's, Skin  REASON FOR ASSESSMENT:   Malnutrition Screening Tool    ASSESSMENT:   Patient with PMH significant for ESRD on HD, COPD, PAD, DM, chronic pancreatitis, HTN, CAD s/p CABG, and CHF. Presents this admission with AVF occlusion (No HD since 5/11) and acute on chronic pancreatitis.   Plan for HD access procedure today.   Pt endorses having inconsistent appetite due to recurring pain from pancreatitis. States she typically eats two meals daily (on non HD and HD days) that consist of L-chinese shrimp lo mein or egg drop soup D- leftovers from lunch. Only finishes a few bites at each meal due to suspected "stomach shrinkage.". Drinks 1-2 Boost Breeze and Prostat daily. Does not like "milky supplements." Currently NPO for procedure but eager to eat. Requests to have Boost Breeze and Prostat.   Has not taken phosphorus binder due to poor appetite. Makes about 1.5 cups of urine daily per reports.   Pt endorses a UBW of 90 lb and is unsure of weight loss. Records indicate pt weighed 114 lb on 05/17/2019 and 93 lb this admission (18.4% wt loss in 8 months, significant for  time frame). Pt states they have lower her EDW at outpt HD 2 months ago and plan to lower it again.  EDW: 43 kg   Medications: hectorol, SS novolog, creon Labs: corrected calcium 9.7 (wdl) CBG 79-149  NUTRITION - FOCUSED PHYSICAL EXAM:    Most Recent Value  Orbital Region  Moderate depletion  Upper Arm Region  Severe depletion  Thoracic and Lumbar Region  Severe depletion  Buccal Region  Severe depletion  Temple Region  Severe depletion  Clavicle Bone Region  Severe depletion  Clavicle and Acromion Bone Region  Severe depletion  Scapular Bone Region  Severe depletion  Dorsal Hand  Severe depletion  Patellar Region  Severe depletion  Anterior Thigh Region  Severe depletion  Posterior Calf Region  Severe depletion  Edema (RD Assessment)  None  Hair  Reviewed  Eyes  Reviewed  Mouth  Reviewed  Skin  Reviewed  Nails  Reviewed     Diet Order:   Diet Order            Diet NPO time specified Except for: Sips with Meds  Diet effective now              EDUCATION NEEDS:   Education needs have been addressed  Skin:  Skin Assessment: Skin Integrity Issues: Skin Integrity Issues:: Incisions Incisions: R ankle  Last BM:  5/18  Height:   Ht Readings from Last 1 Encounters:  01/22/20 5\' 5"  (1.651 m)    Weight:   Wt Readings from  Last 1 Encounters:  01/23/20 42 kg    BMI:  Body mass index is 15.41 kg/m.  Estimated Nutritional Needs:   Kcal:  1300-1500 kcal  Protein:  65-80 grams  Fluid:  1000 ml + UOP   [name] RD, LDN Clinical Nutrition Pager listed in Campus

## 2020-01-23 NOTE — Sedation Documentation (Signed)
Pt arrived to radiology nurses station alert and oriented. Per [facility] and RN on floor pt has received premedication for contrast allergy. Pt denies pain at this time, states that she 'is just ready for this to be over.' Comforted pt. Called Dr Earleen Newport in to see pt prior to procedure.

## 2020-01-23 NOTE — Progress Notes (Addendum)
[facility] Progress Note  Subjective: seen in room, in good spirits  Vitals:   01/23/20 0339 01/23/20 0800 01/23/20 0906 01/23/20 1115  BP: (!) 127/56 123/61 (!) 118/47 (!) 113/57  Pulse: 67 67 70 85  Resp: 12 15 17 20   Temp: 98 F (36.7 C) 98 F (36.7 C)  98.2 F (36.8 C)  TempSrc: Oral Oral  Oral  SpO2: 92% 98% 97% 95%  Weight: 42 kg     Height:        Exam: Gen alert, chron ill appearing, no distress No jvd or bruits Chest decreased air movement bilat, no wheezing RRR no MRG Abd soft ntnd no mass or ascites +bs Ext 1 + bilat pretib edema, no dependent / UE edema Neuro is alert, Ox 3 , nf LUA AVF +pulsation but no bruit    Home meds:  - norvasc 10/ coreg 25 bid/ plavix 75/ hydralazine 25 tid/ lipitor40  - trazodone 150/ dilaudid 2-4mg  po q 4h/ neurontin 300 hs  - PPI/ allopurinol/ creon ac tid  - home O2 2L hs   Outpt HD: [facility]  3.5h  350/800  43kg  2/2 bath AVF  Hep 2200   - mirc 50 q 4 last 4/27   - hect 1     Assessment/ Plan: 1. Clotted AVF - IR consulted and they are planning for access/ procedure today.  2. ESRD - missed last 2 sessions due to access issue. Labs okay. HD today after access established.  3. HTN/ vol - cont meds, no vol excess , at dry wt 4. Chronic abd pain - per primary team 5. Anemia ckd - Hb 10.5, no need esa yet 6. MBC ckd - Ca ok, cont hectorol 7. Depression 8. Gout   [name] 01/23/2020, 1:30 PM   Recent Labs  Lab 01/19/20 2020 01/21/20 2157  K 3.2* 3.4*  BUN 27* 34*  CREATININE 2.39* 2.81*  CALCIUM 7.9* 8.0*  HGB 9.6* 10.5*   Inpatient medications: . allopurinol  100 mg Oral QHS  . amLODipine  10 mg Oral QHS  . atorvastatin  40 mg Oral QHS  . carvedilol  25 mg Oral BID  . Chlorhexidine Gluconate Cloth  6 each Topical Q0600  . gabapentin  300 mg Oral QHS  . heparin  5,000 Units Subcutaneous Q8H  . insulin aspart  0-5 Units Subcutaneous QHS  . insulin aspart  0-6 Units Subcutaneous  TID WC  . lipase/protease/amylase  24,000 Units Oral TID WC  . mupirocin cream   Topical Daily  . nicotine  21 mg Transdermal Daily  . pantoprazole  40 mg Oral QHS  . sodium chloride flush  3 mL Intravenous Once  . traZODone  150 mg Oral QHS    acetaminophen **OR** acetaminophen, albuterol, HYDROmorphone (DILAUDID) injection, ondansetron **OR** ondansetron (ZOFRAN) IV, polyethylene glycol, promethazine

## 2020-01-23 NOTE — Progress Notes (Addendum)
Pt arrived back to rm 24 from Intervention radiology. Reinitiated tele. VSS. Pt alert. Call bell within the bed side.   Lavenia Atlas, RN

## 2020-01-23 NOTE — Progress Notes (Signed)
Physical Therapy Treatment Patient Details Name: [name] MRN: [id number] DOB: 1951/02/10 Today's Date: 01/23/2020    History of Present Illness 69yo female preseting to the ED with acute on chronic abdominal pain, c/o reduced pain and appetite and general weakness since having pancreas nerve block on 12/24/19. Has missed HD since 01/15/20 due to partially blocked AV fistula. Admitted for AV fistula occlusion, acute on chronic pancreatitis, UTI. PMH AAA, CAD, CHF, chronic pain, CKD, COPD, HLD, HTN, MI, nocturnal O2, A-fib, MV repair, DM, CABG 2012    PT Comments    Met pt at door to room after working with OT. Pt limited in safe mobility by pain in low back, stomach and arm, in presence of decreased strength and endurance. Pt agreeable to walking in hall and is able to ambulate 150 feet with Rollator and min guard for safety before requiring seated rest break. Requires 2 min break before ambulating back to her room. Once back at bed pt drops back on bed, encouraged pt to reach back for seat and lower herself down in future for safety. D/c plans remain appropriate at this time. PT will continue to follow acutely.   Follow Up Recommendations  Home health PT;Supervision - Intermittent     Equipment Recommendations  Other (comment)(tub bench)       Precautions / Restrictions Precautions Precautions: Fall Precaution Comments: chronic pain Restrictions Weight Bearing Restrictions: No    Mobility  Bed Mobility Overal bed mobility: Needs Assistance Bed Mobility: Supine to Sit;Sit to Supine     Supine to sit: Supervision Sit to supine: Supervision   General bed mobility comments: increased effort getting LE back in bed  Transfers Overall transfer level: Needs assistance Equipment used: None Transfers: Sit to/from Stand Sit to Stand: Min guard         General transfer comment: pt with decreased eccentric control with sitting down on EoB  Ambulation/Gait Ambulation/Gait  assistance: Min guard Gait Distance (Feet): 150 Feet(150x2) Assistive device: None Gait Pattern/deviations: Step-through pattern;Decreased dorsiflexion - right;Decreased dorsiflexion - left;Trendelenburg;Trunk flexed;Narrow base of support Gait velocity: decreased Gait velocity interpretation: 1.31 - 2.62 ft/sec, indicative of limited community ambulator General Gait Details: slow but steady with min guard, requires seated rest break after 150 feet before returning to room          Balance Overall balance assessment: Mild deficits observed, not formally tested;History of Falls                                          Cognition Arousal/Alertness: Awake/alert Behavior During Therapy: WFL for tasks assessed/performed Overall Cognitive Status: No family/caregiver present to determine baseline cognitive functioning Area of Impairment: Safety/judgement                         Safety/Judgement: Decreased awareness of safety;Decreased awareness of deficits     General Comments: has goal to drive all the way to [place] in a short period of time- overall poor awareness of safety and current deficits         General Comments  VSS on RA      Pertinent Vitals/Pain Pain Assessment: Faces Faces Pain Scale: Hurts little more Pain Location: abdomen Pain Descriptors / Indicators: Aching;Sharp;Sore Pain Intervention(s): Monitored during session;Repositioned;Limited activity within patient's tolerance    Home Living Family/patient expects to be discharged to:: Private residence Living Arrangements:  Alone Available Help at Discharge: Family;Available PRN/intermittently Type of Home: House Home Access: Stairs to enter;Ramped entrance   Home Layout: One level Home Equipment: Walker - 4 wheels;Bedside commode;Transport chair;Grab bars - tub/shower Additional Comments: uses BSC over toilet    Prior Function Level of Independence: Independent      Comments: states  she has been going without the rollator, but having increased unsteadiness   PT Goals (current goals can now be found in the care plan section) Acute Rehab PT Goals Patient Stated Goal: get stronger PT Goal Formulation: With patient Time For Goal Achievement: 02/05/20 Potential to Achieve Goals: Good Progress towards PT goals: Progressing toward goals    Frequency    Min 3X/week      PT Plan Current plan remains appropriate    Co-evaluation              AM-PAC PT "6 Clicks" Mobility   Outcome Measure  Help needed turning from your back to your side while in a flat bed without using bedrails?: None Help needed moving from lying on your back to sitting on the side of a flat bed without using bedrails?: None Help needed moving to and from a bed to a chair (including a wheelchair)?: A Little Help needed standing up from a chair using your arms (e.g., wheelchair or bedside chair)?: A Little Help needed to walk in hospital room?: A Little Help needed climbing 3-5 steps with a railing? : A Lot 6 Click Score: 19    End of Session Equipment Utilized During Treatment: Gait belt Activity Tolerance: Patient tolerated treatment well;Patient limited by fatigue Patient left: in bed;with call bell/phone within reach Nurse Communication: Mobility status PT Visit Diagnosis: Unsteadiness on feet (R26.81);History of falling (Z91.81);Muscle weakness (generalized) (M62.81)     Time: 0567-8893 PT Time Calculation (min) (ACUTE ONLY): 17 min  Charges:  $Gait Training: 8-22 mins                     Janise Gora B. Migdalia Dk PT, DPT Acute Rehabilitation Services Pager [phone] Office [phone]    [place] 01/23/2020, 7:21 PM

## 2020-01-23 NOTE — TOC Benefit Eligibility Note (Signed)
Transition of Care [facility]) Benefit Eligibility Note    Patient Details  Name: LAKEYSHIA TUCKERMAN MRN: [id number] Date of Birth: 1950/12/17   Medication/Dose: ZENPOP  20,000  UNITS CAPSULE  3 X A DAY  Covered?: Yes  Tier: 3 Drug(PREFERRED)  Prescription Coverage [facility]  Spoke with Person/Company/Phone Number:: NOEL  D.  @ [employer] # [phone]  Co-Pay: $ 177.70  Prior Approval: No  Deductible: Unmet(OUT-OF-POCKET UNMET)  Additional Notes: LIPASE  20,000 UNITS CAPSULE 3 X A DAY  : NOT COVER   EXCLUSION   P/A - YES #  [phone]    Memory Argue Phone Number: 01/23/2020, 5:15 PM

## 2020-01-23 NOTE — Progress Notes (Addendum)
Brief History:  [name] is a 69 y.o female with chronic pancreatitis requiring high dose opiate analgesics, HTN, ESRD on HD TRS, CAD/PAD, and COPD who presented to the ED with acute on chronic abdominal pain.  Subjective:  [name] was evaluated and examined this morning at bedside. Patient reports that she is experiencing pain. When asked to localize the pain she states her "pancreas" is painful. On further inquiry of pain, patient indicates epigastric and RUQ abdominal pain that radiates to the back. She quantifies the pain as a 6 out of 10, and says that it "climbing" since she had her pain medication. She reports is reduced to a 5 out of 10 with pain medication. Patient does not report any fevers or chills. She states that she has had some foul smelling urine over the pass few days, but denies any dysuria or changes in urinary frequency.  Patient reported that she has not been able to take her Creon outpatient due to the cost. She states that it was costing her approximately $450. Consequently, she has not been taking her creon outpatient. It was difficult to ascertain the exact length of time since she last took her Creon outpatient as she reported both 1 month and 6 months since her last outpatient dosage. Patient reports one recent episode of diarrhea, and states that she frequently has loose stools.  Patient briefly asked about pain management. We discussed that we are going to maintain the current regimen, and follow-up after her procedures and dialysis today. She denies any constipation.  Objective:  Vital signs in last 24 hours: Vitals:   01/23/20 0339 01/23/20 0800 01/23/20 0906 01/23/20 1115  BP: (!) 127/56 123/61 (!) 118/47 (!) 113/57  Pulse: 67 67 70 85  Resp: 12 15 17 20   Temp: 98 F (36.7 C) 98 F (36.7 C)  98.2 F (36.8 C)  TempSrc: Oral Oral  Oral  SpO2: 92% 98% 97% 95%  Weight: 42 kg     Height:       Weight change:  No intake or output data in the 24 hours  ending 01/23/20 1145  Physical Exam  Constitutional: She is oriented to person, place, and time. No distress.  Chronically ill-appearing.  HENT:  Head: Normocephalic and atraumatic.  Eyes: EOM are normal.  Cardiovascular: Normal rate. Exam reveals no gallop and no friction rub.  Murmur heard. Systolic murmur.  Pulmonary/Chest: Effort normal and breath sounds normal. No respiratory distress. She has no wheezes.  Abdominal: Soft. Bowel sounds are normal. She exhibits no distension. There is abdominal tenderness.  Tenderness to epigastric and RUQ palpation.  Musculoskeletal:        General: No edema.  Neurological: She is alert and oriented to person, place, and time.  Skin: Skin is warm and dry. She is not diaphoretic.  Psychiatric: Mood and affect normal.     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  # Acute on Chronic Pancreatitis Patient with baseline abdominal pain requiring high dose analgesics reports with acute worsening of chronic pain. Pain is epigastric/RUQ and radiates to the back. Lipase was normal on admission. Patient receiving Dilaudid 2 mg every 2 hours for pain management. Assessment: This acute worsening of pain is similar to symptoms patient has experienced in the past  and is consistent with prior episodes of acute on chronic pancreatitis. The normal lipase value is not abnormal in the setting of chronic pancreatitis and does not hold much diagnostic value. Plan: - Change Dilaudid 2 mg PO every 3 hours PRN, as patient not requiring every 2 hours. - PRN Zofran  - Continue creon TID WC - Consult TOC for outpatient Creon affordability issue. Consider switching Creon to Zenpep.  UTI Patient with increased frequency and foul smelling urine. UA  consistent with pyuria and bacteruria, however this is not overtly alarming in a patient with minimal urine output secondary to ESRD. Additionally, patient is not presenting with fever, chills, dysuria, or changes in urinary frequency. May be more risk than benefit to continue antibiotics in the setting of ESRD. - Discontinue ceftriaxone - Instructed patient to notify health care team if she develops fever, chills, or dysuria.  ESRD  Partially occluded AVF - Refer to IR for occluded AVF. - Refer to Nephrology. Per nephrology note, plan for HD once AVF access is obtained.   Hypertension  HLD  CAD s/p CABG in 2012  PVD  - Continue Amlodipine 10 mg QD, Carvedilol 25 mg BID, Atorvastatin 40 mg QHS.  - Hold Plavix 75 mg QD. - Smoking cessation.  Diet: NPO in setting of IR procedure. VTE ppx: Hold SubQ Heparin until after procedure. CODE STATUS: Full Code.    LOS: 1 day   [name], Medical Student 01/23/2020, 11:45 AM

## 2020-01-23 NOTE — Procedures (Signed)
Interventional Radiology Procedure Note  Procedure:   US guided left fistula access.  Fistulagram Balloon angioplasty of edge stenosis/occlusion of stent, with 8mm x 20mm PTA.  Findings: Occlusion at start. Restoration of flow. <30% residual stenosis .  Complications: None  Recommendations:  - OK to use  - 1 hr dc home - Do not submerge for 7 days - Routine care  - Recommend duplex to evaluate the arterial anastamosis.  If there is stenosis, may refer for treatment at the inflow.   Signed,  Dulcy Fanny. Earleen Newport, DO

## 2020-01-23 NOTE — Plan of Care (Signed)
  Problem: Education: Goal: Knowledge of General Education information will improve Description Including pain rating scale, medication(s)/side effects and non-pharmacologic comfort measures Outcome: Progressing   

## 2020-01-23 NOTE — Consult Note (Addendum)
[facility] Nurse Consult Note: Reason for Consult: Consult requested for right leg. Pt states "her cat bit or scratched it a few weeks ago and she was previously on antibiotics."  Wound type: Right anterior lower calf with full thickness wound; 1.5X1cm, 100% black dry scabbed wound bed, no odor or drainage or fluctuance. Dressing procedure/placement/frequency: Topical treatment orders provided for bedside nurses to perform daily to promote moist healing as follows: Apply Bactroban to right leg Q day, then recover with foam dressing.  Change foam dressing Q 3 days or PRN soiling.  Please re-consult if further assistance is needed.  Thank-you,  Julien Girt MSN, [place], [place], [place], [place]

## 2020-01-23 NOTE — TOC Benefit Eligibility Note (Addendum)
Transition of Care [facility]) Benefit Eligibility Note    Patient Details  Name: ELESA GARMAN MRN: [id number] Date of Birth: 30-Dec-1950   Medication/Dose: CREON 24,000 UNITS ORAL 3 X DAILY  WITH MEALS  Covered?: Yes  Tier: 3 Drug  Prescription Coverage [facility]  Spoke with Person/Company/Phone Number:: JESIEL   @  [employer]  #  [phone]  CO-PAY- $169.21 PATIENT STILL IN COVERAGE GAP 25 % CO-INS  CO-PAY-  $47.00 FOR INITIAL PHASE  Prior Approval: No  Deductible: Unmet(OUT-OF-POCKET: [place] Phone Number: 01/23/2020, 11:49 AM

## 2020-01-24 LAB — URINE CULTURE: Culture: >=100000 — AB

## 2020-01-24 LAB — GLUCOSE, CAPILLARY
Glucose-Capillary: 128 mg/dL — ABNORMAL HIGH (ref 70–99)
Glucose-Capillary: 176 mg/dL — ABNORMAL HIGH (ref 70–99)
Glucose-Capillary: 185 mg/dL — ABNORMAL HIGH (ref 70–99)

## 2020-01-24 MED ORDER — ZENPEP 20000-63000 UNITS PO CPEP: 1.0000 | ORAL_CAPSULE | Freq: Three times a day (TID) | ORAL | 0 refills | Status: DC

## 2020-01-24 MED ORDER — CHLORHEXIDINE GLUCONATE CLOTH 2 % EX PADS
6.0000 | MEDICATED_PAD | Freq: Every day | CUTANEOUS | Status: DC
  Administered 2020-01-24: 6 via TOPICAL

## 2020-01-24 MED ORDER — CLOPIDOGREL BISULFATE 75 MG PO TABS
75.0000 mg | ORAL_TABLET | Freq: Every day | ORAL | Status: DC
  Administered 2020-01-24: 75 mg via ORAL
  Filled 2020-01-24: qty 1

## 2020-01-24 MED ORDER — HYDROMORPHONE HCL 2 MG PO TABS
2.0000 mg | ORAL_TABLET | ORAL | Status: DC | PRN
  Administered 2020-01-24: 4 mg via ORAL
  Filled 2020-01-24: qty 2

## 2020-01-24 NOTE — Discharge Summary (Signed)
Name: [name] MRN: [id number] DOB: 1951/05/21 69 y.o. PCP: Bernerd Limbo, MD  Date of Admission: 01/21/2020  9:38 PM Date of Discharge:  01/25/20 Attending Physician: No att. providers found  Discharge Diagnosis:  1. Acute on Chronic Pancreatitis 2. AV Fistula Occlusion 3. Hypotension 4. ESRD 5. CAD s/p CABG in 2012/PVD  Discharge Medications: Allergies as of 01/24/2020      Reactions   Isosorbide Other (See Comments)   Can only tolerate in low doses (unknown reaction) Other reaction(s): Other Unknown "FELT LIKE I WAS GOING TO EXPLODE FROM INSIDE OUT"   Codeine Itching   Contrast Media [iodinated Diagnostic Agents] Itching, Other (See Comments)   Itching of feet   Ioxaglate Itching, Other (See Comments)   Itching of feet   Isosorbide Nitrate Other (See Comments)   Unknown   Metrizamide Itching, Other (See Comments)   Itching of feet   Oxycodone-acetaminophen Itching   Percocet [oxycodone-acetaminophen] Itching, Other (See Comments)   Tolerates Hydrocodone      Medication List    STOP taking these medications   amLODipine 10 MG tablet Commonly known as: NORVASC   amLODipine 5 MG tablet Commonly known as: NORVASC   Creon 12000-38000 units Cpep capsule Generic drug: lipase/protease/amylase Replaced by: Zenpep 20000-63000 units Cpep   hydrALAZINE 25 MG tablet Commonly known as: APRESOLINE     TAKE these medications   albuterol 108 (90 Base) MCG/ACT inhaler Commonly known as: VENTOLIN HFA Inhale 1-2 puffs into the lungs every 6 (six) hours as needed for wheezing or shortness of breath.   allopurinol 100 MG tablet Commonly known as: ZYLOPRIM Take 100 mg by mouth at bedtime.   atorvastatin 40 MG tablet Commonly known as: LIPITOR Take 40 mg by mouth at bedtime.   carvedilol 25 MG tablet Commonly known as: COREG Take 25 mg by mouth 2 (two) times daily.   clopidogrel 75 MG tablet Commonly known as: PLAVIX Take 75 mg by mouth at bedtime.     Dialyvite 800-Zinc 15 0.8 MG Tabs Take 1 tablet by mouth at bedtime.   diphenhydrAMINE 25 MG tablet Commonly known as: BENADRYL Take 50 mg by mouth at bedtime.   gabapentin 300 MG capsule Commonly known as: NEURONTIN Take 1 capsule (300 mg total) by mouth at bedtime.   HYDROmorphone 2 MG tablet Commonly known as: DILAUDID Take 2-4 mg by mouth every 4 (four) hours.   OXYGEN Inhale 2 L into the lungs at bedtime.   pantoprazole 40 MG tablet Commonly known as: PROTONIX Take 1 tablet (40 mg total) by mouth daily. What changed: when to take this   promethazine 12.5 MG tablet Commonly known as: PHENERGAN Take 12.5 mg by mouth every 4 (four) hours as needed for nausea or vomiting.   traZODone 100 MG tablet Commonly known as: DESYREL Take 150 mg by mouth at bedtime.   Zenpep 20000-63000 units Cpep Generic drug: Pancrelipase (Lip-Prot-Amyl) Take 1 capsule by mouth 3 (three) times daily. Replaces: Creon 12000-38000 units Cpep capsule   Zenpep 20000-63000 units Cpep Generic drug: Pancrelipase (Lip-Prot-Amyl) Take 1 capsule by mouth 3 (three) times daily.       Disposition and follow-up:   Ms.[name] was discharged from [facility] in Stable condition.  At the hospital follow up visit please address:   1.  Please ensure patient able to continue dialysis after IR procedure with fistula. Please ensure adequate BP control since stopping amlodipine and hydralazine. Please address patient's ability to obtain Creon for  her chronic pancreatitis.  2.  Labs / imaging needed at time of follow-up: na  3.  Pending labs/ test needing follow-up: na  Follow-up Appointments: Follow-up Information    Bernerd Limbo, MD. Call in 1 week(s).   Specialty: Family Medicine Contact information: [address] [place] [address] [phone]        [facility] Follow up.   Why: will call patient Contact  information: [place] [phone] [place], [facility] Follow up.   Specialty: Home Health Services Why: they will call patient          Hospital Course by problem list:  1. Acute on Chronic Pancreatitis  Patient with baseline abdominal pain requiring high dose analgesics reports acute worsening of chronic pain. Pain is epigastric/RUQ and radiates to the back. Lipase was normal on admission. Patient receiving Dilaudid 2 mg every 3 hours for pain management.  This acute worsening of pain is similar to symptoms patient has experienced in the past and is consistent with prior episodes of acute on chronic pancreatitis. The normal lipase value is not abnormal in the setting of chronic pancreatitis and does not hold much diagnostic value. Patient's pain is improving on IV pain medications and npo diet, eventually able to tolerate normal diet and po meds without pain. Patient had not been taking Creon in 6 months due to unaffordability. It seems she may have not yet met her deductible and her copay is still quite high. We have recommended she speak with her insurance company regarding the unaffordability and to discuss further at her PCP follow up.  2. AV Fistula Occlusion  Patient presenting with AV fistula occlusion. Underwent US guided left fistula access with fistulagram and balloon angioplasty to restore flow which was successful. Was able to be dialyzed through the fistula.  3. Hypotension  Patient presents with concerns about low blood pressures. This is in the setting of acute on chronic malnutrition and weight loss. Blood pressures were quite low during admission, down to 66/40 at its lowest, 122/55 at its highest, with the majority of BPs around 100s/50s. Plan is to stop her amlodipine and her hydralazine. Nephrology can continue to assess at dialysis.  4. ESRD  Patient w/ ESRD on HD TTS presenting in setting of missing  dialysis due to occluded AV fistula s/p successful balloon angioplasty. She was dialyzed on the day prior to discharge and can return to her normal dialysis scheduled outpatient per nephrology.  5. CAD s/p CABG in 2012/PVD on Plavix  Patient's plavix held for IR procedure but restarted night before discharge. Amlodipine and hydral held due to low blood pressues. Smoking cessation encouraged for CAD and PVD.  Discharge Vitals:   BP 120/62 (BP Location: Right Arm)   Pulse 63   Temp 98 F (36.7 C) (Oral)   Resp 18   Ht '5\' 5"'$  (1.651 m)   Wt 43 kg   SpO2 99%   BMI 15.78 kg/m   Pertinent Labs, Studies, and Procedures:  Results for orders placed or performed during the hospital encounter of 01/21/20 (from the past 24 hour(s))  Glucose, capillary     Status: Abnormal   Collection Time: 01/24/20 11:06 AM  Result Value Ref Range   Glucose-Capillary 185 (H) 70 - 99 mg/dL   Comment 1 Notify RN    DG Chest 1 View  Result Date: 01/19/2020 CLINICAL DATA:  Weakness EXAM:  CHEST  1 VIEW COMPARISON:  08/23/2019 FINDINGS: Post sternotomy changes with valve prosthesis and appendage clip. No focal opacity or pleural effusion. Stable cardiomediastinal silhouette with aortic atherosclerosis. No pneumothorax. Left axillary vascular stent. IMPRESSION: No active disease. Electronically Signed   By: Donavan Foil M.D.   On: 01/19/2020 20:33   IR US Guide Vasc Access Left  Result Date: 01/23/2020 INDICATION: 69 year old female with nonfunctional left upper extremity fistula EXAM: ULTRASOUND GUIDED ACCESS LEFT UPPER EXTREMITY FISTULA BALLOON ANGIOPLASTY OF VENOUS OUTFLOW STENOSIS MEDICATIONS: None. ANESTHESIA/SEDATION: Moderate Sedation Time:  10 minutes The patient was continuously monitored during the procedure by the interventional radiology nurse under my direct supervision. FLUOROSCOPY TIME:  Fluoroscopy Time: 1 minutes 30 seconds (5 mGy). COMPLICATIONS: None PROCEDURE: Informed written consent was obtained  from the patient after a thorough discussion of the procedural risks, benefits and alternatives. All questions were addressed. Maximal Sterile Barrier Technique was utilized including caps, mask, sterile gowns, sterile gloves, sterile drape, hand hygiene and skin antiseptic. A timeout was performed prior to the initiation of the procedure. Ultrasound survey was performed with images stored and sent to PACs. The left upper extremity was prepped and draped in the usual sterile fashion. Ultrasound guidance was used to access the upper extremity graft with a micropuncture kit. Exchange was made for a 7 [place] short sheath. Angiogram was performed including reflux images. A Bentson wire was then a navigate into the venous outflow, with an angled catheter advanced into the venous outflow. Angled catheter was advanced across edge stenosis/occlusion in the central edge stent in the cephalic arch. Once the catheter was is within the subclavian vein, completion venogram was performed. Balloon angioplasty was then performed with 8 mm x 40 mm angioplasty of the edge occlusion. Repeat venogram through the sheath confirmed flow through the stent. We confirmed restoration of thrill within the fistula. Catheters and wires were removed. The short sheaths were removed after placement of hemostasis sutures. Patient tolerated the procedure well and remained hemodynamically stable throughout. No complications were encountered and no significant blood loss was encountered. FINDINGS: Pulsatility within the fistula on initial physical exam. Fistulagram demonstrates occlusion of the cephalic venous outflow, secondary to a central edge stenosis in this stent at the cephalic arch. After 8 mm balloon angioplasty there is resolution of the occlusion, with no residual stenosis. There is mild narrowing of the proximal venous outflow just beyond the AV anastomosis, without convincing evidence of high-grade stenosis. At the conclusion there is  restoration of thrill at the proximal fistula. IMPRESSION: Status post ultrasound guided access left upper extremity fistula, and treatment of edge stenosis/occlusion of stent in the venous outflow, with 0% residual and restoration of flow and thrill. Signed, Dulcy Fanny. Dellia Nims, RPVI [facility] ACCESS: This access remains amenable to future percutaneous interventions as clinically indicated. Recommend referral for duplex imaging at this time to evaluate the fistula, and determine whether there is stenosis at the AV anastomosis that would warrant any therapy at this time. Electronically Signed   By: [name] D.O.   On: 01/23/2020 16:11   DG Chest Portable 1 View  Result Date: 01/22/2020 CLINICAL DATA:  Leg swelling and shortness of breath. Last dialysis was 1 week ago. EXAM: PORTABLE CHEST 1 VIEW COMPARISON:  01/19/2020 FINDINGS: The cardiac silhouette, mediastinal and hilar contours are within normal limits and stable. Stable surgical changes from bypass surgery, valve replacement surgery and left atrial appendage closure device. Coronary artery stents are also  noted. Left subclavian stents are noted also. There is tortuosity and calcification of the thoracic aorta. The lungs are clear. No edema, infiltrates or effusions. No worrisome pulmonary lesions. The bony thorax is intact. Remote healed right rib fractures are noted. IMPRESSION: No acute cardiopulmonary findings. Electronically Signed   By: Marijo Sanes M.D.   On: 01/22/2020 05:39   IR AV DIALY SHUNT INTRO NEEDLE/INTRACATH INITIAL W/PTA/IMG LEFT  Result Date: 01/23/2020 INDICATION: 69 year old female with nonfunctional left upper extremity fistula EXAM: ULTRASOUND GUIDED ACCESS LEFT UPPER EXTREMITY FISTULA BALLOON ANGIOPLASTY OF VENOUS OUTFLOW STENOSIS MEDICATIONS: None. ANESTHESIA/SEDATION: Moderate Sedation Time:  10 minutes The patient was continuously monitored during the procedure  by the interventional radiology nurse under my direct supervision. FLUOROSCOPY TIME:  Fluoroscopy Time: 1 minutes 30 seconds (5 mGy). COMPLICATIONS: None PROCEDURE: Informed written consent was obtained from the patient after a thorough discussion of the procedural risks, benefits and alternatives. All questions were addressed. Maximal Sterile Barrier Technique was utilized including caps, mask, sterile gowns, sterile gloves, sterile drape, hand hygiene and skin antiseptic. A timeout was performed prior to the initiation of the procedure. Ultrasound survey was performed with images stored and sent to PACs. The left upper extremity was prepped and draped in the usual sterile fashion. Ultrasound guidance was used to access the upper extremity graft with a micropuncture kit. Exchange was made for a 7 [place] short sheath. Angiogram was performed including reflux images. A Bentson wire was then a navigate into the venous outflow, with an angled catheter advanced into the venous outflow. Angled catheter was advanced across edge stenosis/occlusion in the central edge stent in the cephalic arch. Once the catheter was is within the subclavian vein, completion venogram was performed. Balloon angioplasty was then performed with 8 mm x 40 mm angioplasty of the edge occlusion. Repeat venogram through the sheath confirmed flow through the stent. We confirmed restoration of thrill within the fistula. Catheters and wires were removed. The short sheaths were removed after placement of hemostasis sutures. Patient tolerated the procedure well and remained hemodynamically stable throughout. No complications were encountered and no significant blood loss was encountered. FINDINGS: Pulsatility within the fistula on initial physical exam. Fistulagram demonstrates occlusion of the cephalic venous outflow, secondary to a central edge stenosis in this stent at the cephalic arch. After 8 mm balloon angioplasty there is resolution of the  occlusion, with no residual stenosis. There is mild narrowing of the proximal venous outflow just beyond the AV anastomosis, without convincing evidence of high-grade stenosis. At the conclusion there is restoration of thrill at the proximal fistula. IMPRESSION: Status post ultrasound guided access left upper extremity fistula, and treatment of edge stenosis/occlusion of stent in the venous outflow, with 0% residual and restoration of flow and thrill. Signed, Dulcy Fanny. Dellia Nims, RPVI [facility] Radiology ACCESS: This access remains amenable to future percutaneous interventions as clinically indicated. Recommend referral for duplex imaging at this time to evaluate the fistula, and determine whether there is stenosis at the AV anastomosis that would warrant any therapy at this time. Electronically Signed   By: [name] D.O.   On: 01/23/2020 16:11   IR Radiologist Eval & Mgmt  Result Date: 01/02/2020 Please refer to notes tab for details about interventional procedure. (Op Note)  Discharge Instructions: Discharge Instructions    Activity as tolerated - No restrictions   Complete by: As directed    Amb Referral to Palliative Care   Complete by: As directed  Diet general   Complete by: As directed    Face-to-face encounter (required for Medicare/Medicaid patients)   Complete by: As directed    I Al Decant certify that this patient is under my care and that I, or a nurse practitioner or physician's assistant working with me, had a face-to-face encounter that meets the physician face-to-face encounter requirements with this patient on 01/24/2020. The encounter with the patient was in whole, or in part for the following medical condition(s) which is the primary reason for home health care (List medical condition): acute on chronic pancreatitis   The encounter with the patient was in whole, or in part, for the following medical condition, which is  the primary reason for home health care: acute on chronic pancreatitis   I certify that, based on my findings, the following services are medically necessary home health services: Physical therapy   Reason for Medically [place]: Skilled Nursing- Change/Decline in Patient Status   My clinical findings support the need for the above services: Pain interferes with ambulation/mobility   Further, I certify that my clinical findings support that this patient is homebound due to: Pain interferes with ambulation/mobility   Home Health   Complete by: As directed    To provide the following care/treatments:  PT OT       Signed: Al Decant, MD 01/25/2020, 7:36 AM   Pager: [phone]

## 2020-01-24 NOTE — Discharge Instructions (Signed)
You were seen in the hospital for abdominal pain related to your chronic pancreatitis, issues with your fistula for dialysis and concerns regarding your blood pressure.  You were treated with pain medications with improvement in your abdominal pain.  You underwent a procedure to improve access of your fistula.  And we discussed your blood pressure and the importance of holding 2 blood pressure medicines at this time.  You should follow-up with your primary care doctor within a week.  You should call your insurance company regarding the affordability of your Creon prescription.  You should follow-up in outpatient dialysis at your previously scheduled appointment times.  It was a pleasure to meet you.

## 2020-01-24 NOTE — Progress Notes (Signed)
[facility] Progress Note  Subjective: seen in room, in good spirits  Vitals:   01/23/20 2330 01/23/20 2345 01/24/20 0026 01/24/20 0734  BP: (!) 93/47 131/62 (!) 136/50 (!) 101/54  Pulse: 62 68 81 65  Resp: 17 17 13 18   Temp:  97.6 F (36.4 C) (!) 97.4 F (36.3 C) 97.6 F (36.4 C)  TempSrc:  Oral Oral Oral  SpO2:  99% 98% 96%  Weight:  43 kg    Height:        Exam: Gen alert, chron ill appearing, no distress No jvd or bruits Chest decreased air movement bilat, no wheezing RRR no MRG Abd soft ntnd no mass or ascites +bs Ext 1 + bilat pretib edema, no dependent / UE edema Neuro is alert, Ox 3 , nf LUA AVF +pulsation but no bruit    Home meds:  - norvasc 10/ coreg 25 bid/ plavix 75/ hydralazine 25 tid/ lipitor40  - trazodone 150/ dilaudid 2-4mg  po q 4h/ neurontin 300 hs  - PPI/ allopurinol/ creon ac tid  - home O2 2L hs   Outpt HD: TTS [place]  3.5h  350/800  43kg  2/2 bath AVF  Hep 2200   - mirc 50 q 4 last 4/27   - hect 1     Assessment/ Plan: 1. Clotted AVF - IR did declot of AVF yesterday and did balloon angioplasty of occluded stent. They suggested she might benefit from evaluation for inflow issues which could be done in OP setting (duplex/ fistulogram). OK for dc today, have d/w primary team. Next HD will be Saturday as she was just dialyzed late last night.  2. ESRD - missed last 2 sessions due to access issue. Labs okay. Had HD last night after access procedure.  3. HTN/ vol - cont meds, no vol excess , at dry wt 4. Chronic abd pain - per primary team 5. Anemia ckd - Hb 10.5, no need esa yet 6. MBC ckd - Ca ok, cont hectorol 7. Depression 8. Gout   Rob Amelio Brosky 01/24/2020, 9:23 AM   Recent Labs  Lab 01/19/20 2020 01/21/20 2157  K 3.2* 3.4*  BUN 27* 34*  CREATININE 2.39* 2.81*  CALCIUM 7.9* 8.0*  HGB 9.6* 10.5*   Inpatient medications: . allopurinol  100 mg Oral QHS  . amLODipine  10 mg Oral QHS  . atorvastatin  40 mg Oral  QHS  . carvedilol  25 mg Oral BID  . Chlorhexidine Gluconate Cloth  6 each Topical Q0600  . Chlorhexidine Gluconate Cloth  6 each Topical Q0600  . doxercalciferol  1 mcg Intravenous Q T,Th,Sa-HD  . feeding supplement  1 Container Oral TID BM  . feeding supplement (PRO-STAT SUGAR FREE 64)  30 mL Oral TID  . gabapentin  300 mg Oral QHS  . heparin  5,000 Units Subcutaneous Q8H  . insulin aspart  0-5 Units Subcutaneous QHS  . insulin aspart  0-6 Units Subcutaneous TID WC  . lipase/protease/amylase  24,000 Units Oral TID WC  . multivitamin  1 tablet Oral QHS  . mupirocin cream   Topical Daily  . nicotine  21 mg Transdermal Daily  . pantoprazole  40 mg Oral QHS  . sodium chloride flush  3 mL Intravenous Once  . traZODone  150 mg Oral QHS    acetaminophen **OR** acetaminophen, albuterol, HYDROmorphone (DILAUDID) injection, ondansetron **OR** ondansetron (ZOFRAN) IV, polyethylene glycol, promethazine

## 2020-01-24 NOTE — Care Management (Addendum)
Spoke with [name] regarding her eligibility for Creon and Zenpep Discussed what happened with the pharmacy previously with creon, the order was for over 2000 pills this is why it was over 400 dollars.  She has not met her deductible yet. Encouraged her to call insurance to ask when her deductible will be met. Faxed her prescription to pharmacy fax number [phone] [facility] in [place], [place] fax went through,  prescription placed on shadow chart.

## 2020-01-24 NOTE — Progress Notes (Addendum)
Brief History:  [name] is a 69 y.o female with chronic pancreatitis requiring high dose opiate analgesics, HTN, ESRD on HD TRS, CAD/PAD, and COPD who presented to the ED with acute on chronic abdominal pain.  Subjective:  [name] was evaluated and examined this morning at bedside. Patient reports that she is in pain in the epigastric and RUQ region. She states the pain is less severe today than it was yesterday. Patient denies any vomiting or nausea. She states that she is tolerating her liquid diet well, and would like to start a full diet. She states that she is not experiencing any pain from the wound that she received from her cat a few weeks ago, and was able to teach back the wound care instructions that she received yesterday. She denies any fevers or chills. We discussed with the patient about switching to PO pain management, she stated that she would like one more injection of Dilaudid this morning before switching to PO.  We discussed the affordability of Creon with the patient this morning, and the possibility to switch to Zenpep if it is more affordable. The patient stressed that she is on fixed income and was not able to afford Creon prior to admission.  Objective:  Vital signs in last 24 hours: Vitals:   01/23/20 2300 01/23/20 2330 01/23/20 2345 01/24/20 0026  BP: (!) 94/51 (!) 93/47 131/62 (!) 136/50  Pulse: 61 62 68 81  Resp: 17 17 17 13   Temp:   97.6 F (36.4 C) (!) 97.4 F (36.3 C)  TempSrc:   Oral Oral  SpO2:   99% 98%  Weight:   43 kg   Height:       Weight change: 2.197 kg  Intake/Output Summary (Last 24 hours) at 01/24/2020 4008 Last data filed at 01/23/2020 2345 Gross per 24 hour  Intake 120 ml  Output 500 ml  Net -380 ml   Physical Exam  Constitutional: She is oriented to person, place, and time. No distress.  Chronically ill appearing.  HENT:  Head: Normocephalic and atraumatic.  Eyes: EOM are normal.  Cardiovascular: Normal rate, regular  rhythm and normal heart sounds. Exam reveals no gallop and no friction rub.  No murmur heard. Pulmonary/Chest: Effort normal and breath sounds normal. No respiratory distress. She has no wheezes.  Abdominal: Soft. Bowel sounds are normal. She exhibits no distension.  Tenderness to light palpation of RUQ and epigastric regions.  Neurological: She is alert and oriented to person, place, and time.  Skin: Skin is warm and dry. She is not diaphoretic.  Wound on right lower extremity. Bandaged. Without surrounding edema or erythema.      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  # Acute on Chronic Pancreatitis Patient with baseline abdominal pain requiring high dose analgesics reports with acute worsening of chronic pain. Pain is epigastric/RUQ and radiates to the back. Lipase was normal on admission. Patient receiving Dilaudid 2 mg every 3 hours for pain management. Assessment: This acute worsening of pain is similar to symptoms patient has experienced in the past and is consistent with prior episodes of acute on chronic pancreatitis. The normal lipase value is not abnormal in the setting of chronic pancreatitis and does not hold much diagnostic value. Patient's pain is  improving, discussed with the patient about switching pain medication to PO, and she would like to have a full diet. She was tolerating liquid diet well, will see how she tolerates full diet. Plan: - Dilaudid 2-4 mg PO every 4 hours PRN. - PRN Zofran for nausea. - Continue Creon. - Medication affordability: Creon vs. Zenpep. Working with pharmacy to determine the best option for the patient that she will be able to afford outpatient.  # Wound from Cat scratch Patient reported that she has a wound on her right  leg. Wound care consulted and recommended bactroban on wound once daily, with foam dressings. Wound without surrounding erythema or edema, and is not purulent. - Continue wound care per wound care recommendations.  # ESRD  # Partially occluded AVF IR performed US guided left fistula acces with fistulagram and ballon angioplasty yesterday to restore flow of occluded AVF. Underwent HD yesterday with net UF of 500 mL. Discussed briefly with nephrology today, if discharged home today, she will get next HD outpatient.  - Refer to Nephrology.  # Hypertension  # HLD  # CAD s/p CABG in 2012  # PVD  - Continue Amlodipine 10 mg QD, Carvedilol 25 mg BID, Atorvastatin 40 mg QHS. - Plavix 75 mg daily. - Smoking cessation.  Diet:Regular diet. VTE ppx:Subcutaneous Heparin CODE STATUS: Full Code.    LOS: 2 days   Nanetta Batty, Medical Student 01/24/2020, 6:35 AM

## 2020-01-24 NOTE — TOC Initial Note (Signed)
Transition of Care [facility]) - Initial/Assessment Note    Patient Details  Name: [name] MRN: [id number] Date of Birth: 1950/12/23  Transition of Care [facility]) CM/SW Contact:    Verdell Carmine, RN Phone Number: 01/24/2020, 2:51 PM  Clinical Narrative:                 Patient admitted with Pancreatitis and blocked AV fistula. Had not hemodialysis due to the clotted fistula. Chronic pancreatitis, ongoing chronic medical issues of kidney failure requiring hemodialysis. When I spoke with her she had talked with her daughter, who is a Education officer, museum. She decided that she wanted PT, OT and palliative care at home. Spoke to Dr. Madilyn Fireman, orders received. Arr[facility] of [facility]. Pallative services arranged through [facility].   Expected Discharge Plan: [place] Barriers to Discharge: No Barriers Identified   Patient Goals and CMS Choice Patient states their goals for this hospitalization and ongoing recovery are:: [place] with home health and pallitive services CMS Medicare.gov Compare Post Acute Care list provided to:: Patient Choice offered to / list presented to : Patient  Expected Discharge Plan and Services Expected Discharge Plan: [place]   Discharge Planning Services: CM Consult Post Acute Care Choice: Home Health(Pallative) Living arrangements for the past 2 months: Mobile Home Expected Discharge Date: 01/24/20                         HH Arranged: PT, OT HH Agency: [place]) Date HH Agency Contacted: 01/24/20 Time HH Agency Contacted: [phone]    Prior Living Arrangements/Services Living arrangements for the past 2 months: Mobile Home   Patient language and need for interpreter reviewed:: Yes Do you feel safe going back to the place where you live?: Yes      Need for Family Participation in Patient Care: Yes (Comment) Care giver support system in place?: Yes (comment)    Criminal Activity/Legal Involvement Pertinent to Current Situation/Hospitalization: No - Comment as needed  Activities of Daily Living Home Assistive Devices/Equipment: Walker (specify type)(with a seat) ADL Screening (condition at time of admission) Patient's cognitive ability adequate to safely complete daily activities?: Yes Is the patient deaf or have difficulty hearing?: No Does the patient have difficulty seeing, even when wearing glasses/contacts?: No Does the patient have difficulty concentrating, remembering, or making decisions?: No Patient able to express need for assistance with ADLs?: Yes Does the patient have difficulty dressing or bathing?: No Independently performs ADLs?: Yes (appropriate for developmental age) Does the patient have difficulty walking or climbing stairs?: Yes Weakness of Legs: Both Weakness of Arms/Hands: Both  Permission Sought/Granted Permission sought to share information with : Case Manager Permission granted to share information with : Yes, Verbal Permission Granted     Permission granted to share info w AGENCY: [place], [facility]        Emotional Assessment Appearance:: Appears stated age Attitude/Demeanor/Rapport: Gracious Affect (typically observed): Pleasant, Stable Orientation: : Oriented to Self, Oriented to Place, Oriented to  Time, Oriented to Situation Alcohol / Substance Use: Tobacco Use Psych Involvement: No (comment)  Admission diagnosis:  Pyelonephritis [N12] ESRD (end stage renal disease) ([place]) [N18.6] AV fistula occlusion ([place]) [T82.898A] Clotted renal dialysis arteriovenous graft ([place]) [I33.825K] Patient Active Problem List   Diagnosis Date Noted  . AV fistula occlusion ([place]) 01/22/2020  . AV fistula thrombosis, initial encounter ([place]) 01/19/2020  . AF (paroxysmal  atrial fibrillation) ([place]) 01/19/2020  . Chronic respiratory failure with hypoxia ([place]) 01/19/2020  . Acute pancreatitis 07/26/2019  . Acute on  chronic pancreatitis ([place]) 05/12/2019  . Abnormal CT of the abdomen 09/25/2018  . Pancreatic divisum 09/25/2018  . Pancreatic mass   . ESRD on hemodialysis ([place]) 09/09/2018  . Renal failure (ARF), acute on chronic (HCC) 08/15/2018  . ARF (acute renal failure) ([place]) 08/15/2018  . CKD (chronic kidney disease), stage V ([place]) 08/15/2018  . Hypomagnesemia 08/15/2018  . Acute pulmonary edema ([place]) 08/14/2018  . Goals of care, counseling/discussion   . DNR (do not resuscitate) discussion   . AKI (acute kidney injury) ([place]) 07/26/2018  . Acute encephalopathy 07/24/2018  . Chronic pain 07/24/2018  . Malnutrition of moderate degree 07/13/2018  . Stage II pressure ulcer ([place]) 07/12/2018  . Fluid collection at surgical site   . Intraabdominal fluid collection   . Cystic duct calculus   . RUQ pain 07/08/2018  . Peripancreatic fluid collection 07/04/2018  . 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 Urinary retention 07/04/2018  . S/P insertion of iliac artery stent 07/04/2018  . Endoleak post (EVAR) endovascular aneurysm repair ([place]) 07/04/2018  . Physical deconditioning   . Esophageal ulcer with bleeding   . Pressure injury of skin 06/16/2018  . Hematemesis   . Palliative care patient   . Palliative care by specialist   . Anasarca 06/07/2018  . Demand ischemia ([place]) 06/07/2018  . Protein-calorie malnutrition, severe 05/29/2018  . Type II diabetes mellitus with renal manifestations ([place]) 05/28/2018  . Recurrent pancreatitis 05/28/2018  . Hypokalemia 05/28/2018  . SIRS (systemic inflammatory response syndrome) ([place]) 05/28/2018  . Acute recurrent pancreatitis 05/28/2018  . Volume overload 04/17/2018  . Anemia due to GI blood loss 04/14/2018  . Acute gallstone pancreatitis 04/14/2018  . Acute cholecystitis 04/14/2018  . Gastrointestinal hemorrhage   . Fatigue associated with anemia 04/09/2018  . Hypoglycemia 01/19/2018  . Type II diabetes mellitus ([place]) 01/19/2018  . Nausea with vomiting  01/19/2018  . Uncontrolled hypertension 01/19/2018  . Hyperlipidemia 01/19/2018  . CAD (coronary artery disease) 01/19/2018  . Chronic kidney disease (CKD), stage IV (severe) ([place]) 01/19/2018  . Retroperitoneal hematoma 01/19/2018  . Acute blood loss anemia 01/19/2018  . Chronic diastolic congestive heart failure ([place]) 01/19/2018  . AAA (abdominal aortic aneurysm) ([place]) 01/16/2018  . Elevated brain natriuretic peptide (BNP) level 10/07/2015  . Diabetes mellitus ([place]) 10/07/2015  . Pain in the chest   . PAD (peripheral artery disease) ([place]) 07/29/2015  . Chest pain 07/04/2015  . Acute renal failure superimposed on stage 4 chronic kidney disease ([place]) 07/04/2015  . GERD (gastroesophageal reflux disease) 07/04/2015  . Anemia 06/26/2015  . Coronary artery disease involving autologous vein coronary bypass graft with unstable angina pectoris ([place]) 06/24/2015  . Angina at rest [facility])   . Subclavian artery stenosis, left ([place]) 06/23/2015  . AAA (abdominal aortic aneurysm) without rupture ([place]) 12/10/2014  . Subclavian steal syndrome 12/10/2014  . Renal artery stenosis ([place]) 09/04/2013  . Carotid artery stenosis 09/28/2012  . Mitral valve disorder 06/23/2011  . S/P CABG x 3 06/16/2011  . S/P Maze operation for atrial fibrillation 06/16/2011  . S/P MVR (mitral valve repair) 06/16/2011  . Follow-up examination following surgery 03/29/2011  . CKD (chronic kidney disease) 03/03/2011  . COPD with chronic bronchitis ([place]) 03/03/2011  . Chronic diastolic heart failure ([place]) 02/26/2011  . Hypotension 02/26/2011  . NSTEMI (non-ST elevated myocardial infarction) ([place]) 02/26/2011  . Long term (current) use of anticoagulants  12/17/2010  . OBSTRUCTIVE SLEEP APNEA 09/04/2010  . HYPOXEMIA 09/04/2010  . SNORING 08/05/2010  . Atrial fibrillation ([place]) 12/09/2009  . WEIGHT LOSS 03/28/2009  . PALPITATIONS 03/28/2009  . PVD 11/21/2008  . HYPERLIPIDEMIA-MIXED 11/20/2008  . TOBACCO ABUSE 11/20/2008  .  Essential hypertension 11/20/2008  . Coronary artery disease involving native heart with angina pectoris ([place]) 11/20/2008  . ABDOMINAL AORTIC ANEURYSM 11/20/2008   PCP:  Bernerd Limbo, MD Pharmacy:   [place], [place] - [address] [place] [address] Phone: [phone] Fax: [place], [place] [address] Phone: [phone] Fax: [phone]     Social Determinants of Health (SDOH) Interventions    Readmission Risk Interventions Readmission Risk Prevention Plan 01/24/2020 07/30/2019  Transportation Screening Complete Complete  Medication Review (RN Care Manager) Complete Referral to Pharmacy  PCP or Specialist appointment within 3-5 days of discharge Complete Complete  HRI or [place] Complete Complete  SW Recovery Care/Counseling Consult Complete Complete  Palliative Care Screening Complete Not [place] Not Applicable Not Applicable  Some recent data might be hidden

## 2020-01-24 NOTE — Progress Notes (Signed)
Discharge instructions reviewed with patient. All medications, follow up appointments, and discharge instructions provided. IV out. Monitor off CCMD notified. Discharging home with daughter.  Paulene Floor, RN

## 2020-01-25 ENCOUNTER — Telehealth (HOSPITAL_COMMUNITY): Payer: Self-pay | Admitting: Nephrology

## 2020-01-25 ENCOUNTER — Telehealth: Payer: Self-pay

## 2020-01-25 NOTE — Telephone Encounter (Signed)
rec'vd referral from [facility] - decreased thrill/bruit and clearance, ok to sch fistulagram per Dr. Donzetta Matters

## 2020-01-25 NOTE — Telephone Encounter (Signed)
Transition of care contact from inpatient facility  Date of Discharge: 01/24/2020 Date of Contact: 01/25/2020 --attempted Method of contact: Phone  Attempted to contact patient to discuss transition of care from inpatient admission. Patient did not answer the phone. Message was left on the patient's voicemail with call back number [phone].  Veneta Penton, PA-C [employer] Pager [phone]

## 2020-01-26 ENCOUNTER — Telehealth: Payer: Self-pay | Admitting: Nephrology

## 2020-01-26 NOTE — Telephone Encounter (Signed)
Transition of care contact from inpatient facility  Date of discharge: 01/24/20 Date of contact: 01/26/20 Method: Phone Spoke to: Patient  Patient contacted to discuss transition of care from recent inpatient hospitalization. Patient was admitted to [facility] from 01/21/20 to 01/25/20 with discharge diagnosis of acute on chronic pancreatitis and AV fistula occlusion.   Medication changes were reviewed.  Patient will follow up with his/her outpatient HD unit on: Patient attended dialysis today and her fistula was cannulated successfully.

## 2020-01-28 ENCOUNTER — Telehealth: Payer: Self-pay | Admitting: Gastroenterology

## 2020-01-28 ENCOUNTER — Telehealth: Payer: Self-pay | Admitting: Primary Care

## 2020-01-28 NOTE — Telephone Encounter (Signed)
Patient has been rescheduled to 6/2 and the daughter has been notified

## 2020-01-28 NOTE — Telephone Encounter (Signed)
Spoke with patient's daughter Kirke Shaggy, regarding Palliative services and after a lengthy discussion with daughter I told her that I would call and speak with patient to see if she was in agreement with starting Palliative services. Daughter requested a call from NP with an update if patient was agreeable with Consult.  Called patient to see if she was in agreement with Palliative services, no answer - left message with reason for call along with my name and contact number.

## 2020-01-28 NOTE — Telephone Encounter (Signed)
Scheduled [facility] Palliative visit for 01-30-20 at 8:30.

## 2020-01-30 ENCOUNTER — Other Ambulatory Visit: Payer: Medicare Other | Admitting: Primary Care

## 2020-01-30 ENCOUNTER — Other Ambulatory Visit: Payer: Self-pay

## 2020-01-30 DIAGNOSIS — Z992 Dependence on renal dialysis: Secondary | ICD-10-CM

## 2020-01-30 DIAGNOSIS — N186 End stage renal disease: Secondary | ICD-10-CM

## 2020-01-30 DIAGNOSIS — Z515 Encounter for palliative care: Secondary | ICD-10-CM

## 2020-01-30 NOTE — Progress Notes (Signed)
[facility] Consult Note Telephone: [phone]  Fax: [phone]  PATIENT NAME: [name] [address] [place] [address] [phone] (home)  DOB: 11-03-1950 MRN: [id number]  PRIMARY CARE PROVIDER:    Bernerd Limbo, MD,  [address] [place] [address] [phone]  REFERRING PROVIDER:   Bernerd Limbo, MD [address] [place],  [place] [address] [phone]  RESPONSIBLE PARTY:   Extended Emergency Contact Information Primary Emergency Contact: [facility] Address: [place], [place] [address] Johnnette Litter of [place] Phone: [phone] Relation: Daughter Secondary Emergency Contact: Tyler,Shannon Address: [address]          [place], [place] [address] Johnnette Litter of [place] Mobile Phone: [phone] Relation: Daughter  I met with patient in the home.  ASSESSMENT AND RECOMMENDATIONS:   1. Advance Care Planning/Goals of Care: Goals include to maximize quality of life and symptom management. Discussed POA, 5 wishes given.  Our advance care planning conversation included a discussion about:   . The value and importance of advance care planning  . Experiences with loved ones who have been seriously ill or have died  . Exploration of personal, cultural or spiritual beliefs that might influence medical decisions  . Exploration of goals of care in the event of a sudden injury or illness  . Identification and preparation of a healthcare agent  . Printed materials left (5 wishes, MOST) to plan an  advance directive document .  2. Symptom Management:   Smoking cessation: Discussed, smokes 0.75 pack a day. States these are a comfort and is not interested in cessation  Pain: Reports pain in legs, back and pancreas. Denies constipation. Has been taking great amounts of tylenol. Recommend to stay within the 3 gm recommendation. Has been taking double  this. She states she needs 4 mg/ 3 hrs dilaudid most of the time.  Dialysis: Had clot in graft, but was opened. Tolerating well.   Nutrition: Poor, albumin 2.6. Has lost > 70 lbs in past 2 years. Baseline wt prior to illness was 160's, now weights in low 90's. Takes Ensure/boost fruit supplements but cannot afford > 1 /day.  Depression: Endorses poor mood, sleep and appetite.  Recommend to begin sertraline.Message left with Dr. Posey Pronto for dialysis dosing.  Community support: [facility] : [facility], getting PT, OT. Will coordinate care. MOW: [employer] application given.   3. Family /Caregiver/Community Supports: Has 2 adult daughters who help with care[place]  4. Cognitive / Functional decline: A and O x 3, endorses depression, walking with walker with effort, needs assistance with some adls and iadls.  5. Follow up Palliative Care Visit: Palliative care will continue to follow for goals of care clarification and symptom management. Return 4 weeks or prn.  I spent 75 minutes providing this consultation,  from 0830 to 0945. More than 50% of the time in this consultation was spent coordinating communication.   HISTORY OF PRESENT ILLNESS:  [name] is a 69 y.o. year old female with multiple medical problems including COPD, ESRD, protein calorie malnutrition. Palliative Care was asked to follow this patient by consultation request of Bernerd Limbo, MD to help address advance care planning and goals of care. This is the initial visit.  CODE STATUS: TBD  PPS: 40% HOSPICE ELIGIBILITY/DIAGNOSIS: no, on HD  PAST MEDICAL HISTORY:  Past Medical History:  Diagnosis Date  . AAA (abdominal aortic aneurysm) ([facility]) 5/09   3.7x3.3 by u/s 2009  . Abnormal EKG    deep TW inversions chronic  . Anemia   . CAD (coronary artery disease)    a. CABG 03/2011 LIMA to LAD, SVG to OM, SVG to PDA. b. cath 06/25/2015 DES to SVG to rPDA, patent LIMA to LAD, patent SVG to OM    . carotid stenosis 5/09   S/p L CEA ;  60-79% bilat ICA by preCABG dopplers 7/12  . CHF (congestive heart failure) ([facility])   . Chronic back pain    "all over" (06/24/2015)  . Chronic bronchitis ([facility])    "get it pretty much q yr" (06/24/2015)  . CKD (chronic kidney disease)   . Complication of anesthesia 1966   "problem w/ether"  . COPD (chronic obstructive pulmonary disease) ([facility])   . Depression   . Dysrhythmia   . GERD (gastroesophageal reflux disease)    With hiatal hernia  . GIB (gastrointestinal bleeding) 9/11   S/p EGD with cautery at [facility]  . Heart murmur   . History of blood transfusion "a few times"   "all related to anemia" (06/24/2015)  . History of hiatal hernia   . Hyperlipidemia   . Hypertension   . Mitral regurgitation    3+ MR by intraoperative TEE;  s/p MV repair with Dr. Roxy Manns 7/12  . Myocardial infarction [facility]) 2012 "several"  . On home oxygen therapy    "2 liters at night; negative for sleep apnea"  . PAD (peripheral artery disease) ([facility])    Severe; s/p bilateral renal artery stents, moderate in-stent restenosis  . Pancreatitis 05/2018  . Paroxysmal atrial fibrillation ([facility])    coumadin;  echo 9/07: EF 60%, mild LVH;  s/p [facility] 7/12 with LAA clipping  . Subclavian artery stenosis, left ([facility])    stented by Dr. Trula Slade on 10/18 to help flow of her LIMA to LAD  . Type II diabetes mellitus ([facility])     SOCIAL HX:  Social History   Tobacco Use  . Smoking status: Current Every Day Smoker    Packs/day: 0.75    Years: 50.00    Pack years: 37.50    Types: Cigarettes  . Smokeless tobacco: Never Used  . Tobacco comment: 3/4 pk per day  Substance Use Topics  . Alcohol use: Yes    Alcohol/week: 0.0 standard drinks    ALLERGIES:  Allergies  Allergen Reactions  . Isosorbide Other (See Comments)    Can only tolerate in low doses (unknown reaction) Other reaction(s): Other Unknown "FELT LIKE I WAS GOING TO EXPLODE FROM INSIDE OUT"  . Codeine Itching  .  Contrast Media [Iodinated Diagnostic Agents] Itching and Other (See Comments)    Itching of feet  . Ioxaglate Itching and Other (See Comments)    Itching of feet  . Isosorbide Nitrate Other (See Comments)    Unknown   . Metrizamide Itching and Other (See Comments)    Itching of feet  . Oxycodone-Acetaminophen Itching  . Percocet [Oxycodone-Acetaminophen] Itching and Other (See Comments)    Tolerates Hydrocodone     PERTINENT MEDICATIONS:  Outpatient Encounter Medications as of 01/30/2020  Medication Sig  . carvedilol (COREG) 25 MG tablet Take 25 mg by mouth 2 (two) times daily.  . clopidogrel (PLAVIX) 75 MG tablet Take 75 mg by mouth at bedtime.   . diphenhydrAMINE (BENADRYL) 25 MG tablet Take 50 mg by mouth at bedtime.  . dronabinol (MARINOL)  2.5 MG capsule Take 2.5 mg by mouth 2 (two) times daily before a meal.  . gabapentin (NEURONTIN) 300 MG capsule Take 1 capsule (300 mg total) by mouth at bedtime.  Marland Kitchen HYDROmorphone (DILAUDID) 2 MG tablet Take 2-4 mg by mouth every 4 (four) hours.   . OXYGEN Inhale 2 L into the lungs at bedtime.   . Pancrelipase, Lip-Prot-Amyl, (ZENPEP) 20000-63000 units CPEP Take 1 capsule by mouth 3 (three) times daily.  . pantoprazole (PROTONIX) 40 MG tablet Take 1 tablet (40 mg total) by mouth daily. (Patient taking differently: Take 40 mg by mouth at bedtime. )  . promethazine (PHENERGAN) 12.5 MG tablet Take 12.5 mg by mouth every 4 (four) hours as needed for nausea or vomiting.  . traZODone (DESYREL) 100 MG tablet Take 150 mg by mouth at bedtime.   Marland Kitchen albuterol (VENTOLIN HFA) 108 (90 Base) MCG/ACT inhaler Inhale 1-2 puffs into the lungs every 6 (six) hours as needed for wheezing or shortness of breath.  . allopurinol (ZYLOPRIM) 100 MG tablet Take 100 mg by mouth at bedtime.   Marland Kitchen atorvastatin (LIPITOR) 40 MG tablet Take 40 mg by mouth at bedtime.   . [DISCONTINUED] B Complex-C-Zn-Folic Acid (DIALYVITE 253-GUYQ 15) 0.8 MG TABS Take 1 tablet by mouth at bedtime.     . [DISCONTINUED] Pancrelipase, Lip-Prot-Amyl, (ZENPEP) 20000-63000 units CPEP Take 1 capsule by mouth 3 (three) times daily.   No facility-administered encounter medications on file as of 01/30/2020.    PHYSICAL EXAM / ROS:   Current and past weights: 91 lbs General: NAD, frail appearing, thin Cardiovascular: no chest pain reported, +2 in feet edema  Pulmonary: no cough, no increased SOB, room air, uses oxygen at hs Abdomen: appetite fair, ontinent of bowel GU: denies dysuria, continent of urine, HD MSK: +  joint and ROM abnormalities, ambulatory with rollator Skin: no rashes or wounds reported Neurological: Weakness, endorses pain, insomnia  Jason Coop, NP [facility]  COVID-19 PATIENT SCREENING TOOL  Person answering questions: ____________Self_______ _____   1.  Is the patient or any family member in the home showing any signs or symptoms regarding respiratory infection?               Person with Symptom- __________NA_________________  a. Fever                                                                          Yes___ No___          ___________________  b. Shortness of breath                                                    Yes___ No___          ___________________ c. Cough/congestion                                       Yes___  No___         ___________________ d. Body aches/pains  Yes___ No___        ____________________ e. Gastrointestinal symptoms (diarrhea, nausea)           Yes___ No___        ____________________  2. Within the past 14 days, has anyone living in the home had any contact with someone with or under investigation for COVID-19?    Yes___ No_X_   Person __________________

## 2020-01-31 ENCOUNTER — Telehealth: Payer: Self-pay | Admitting: Primary Care

## 2020-01-31 NOTE — Telephone Encounter (Signed)
Call from Dr Posey Pronto, nephrology, stating 25 mg of zoloft would be fine to begin, no particular renal considerations. I have sent prescription to [facility] in [place] for 25 mg daily and then will assess effect on our next visit. Pt was aware that I was checking on renal dosing and will send to drug store.

## 2020-02-06 ENCOUNTER — Other Ambulatory Visit (INDEPENDENT_AMBULATORY_CARE_PROVIDER_SITE_OTHER): Payer: Medicare Other

## 2020-02-06 ENCOUNTER — Encounter: Payer: Self-pay | Admitting: Gastroenterology

## 2020-02-06 ENCOUNTER — Ambulatory Visit (INDEPENDENT_AMBULATORY_CARE_PROVIDER_SITE_OTHER): Payer: Medicare Other | Admitting: Gastroenterology

## 2020-02-06 VITALS — BP 90/50 | HR 92 | Ht 65.0 in | Wt 88.0 lb

## 2020-02-06 DIAGNOSIS — K859 Acute pancreatitis without necrosis or infection, unspecified: Secondary | ICD-10-CM

## 2020-02-06 DIAGNOSIS — K861 Other chronic pancreatitis: Secondary | ICD-10-CM | POA: Diagnosis not present

## 2020-02-06 DIAGNOSIS — R634 Abnormal weight loss: Secondary | ICD-10-CM | POA: Diagnosis not present

## 2020-02-06 DIAGNOSIS — R1084 Generalized abdominal pain: Secondary | ICD-10-CM | POA: Diagnosis not present

## 2020-02-06 DIAGNOSIS — R1310 Dysphagia, unspecified: Secondary | ICD-10-CM | POA: Diagnosis not present

## 2020-02-06 LAB — BASIC METABOLIC PANEL
BUN: 15 mg/dL (ref 6–23)
CO2: 33 mEq/L — ABNORMAL HIGH (ref 19–32)
Calcium: 8.2 mg/dL — ABNORMAL LOW (ref 8.4–10.5)
Chloride: 98 mEq/L (ref 96–112)
Creatinine, Ser: 2.1 mg/dL — ABNORMAL HIGH (ref 0.40–1.20)
GFR: 23.5 mL/min — ABNORMAL LOW (ref >60.00)
Glucose, Bld: 137 mg/dL — ABNORMAL HIGH (ref 70–99)
Potassium: 3.1 mEq/L — ABNORMAL LOW (ref 3.5–5.1)
Sodium: 139 mEq/L (ref 135–145)

## 2020-02-06 LAB — CBC
HCT: 36.8 % (ref 36.0–46.0)
Hemoglobin: 12.3 g/dL (ref 12.0–15.0)
MCHC: 33.3 g/dL (ref 30.0–36.0)
MCV: 104.3 fl — ABNORMAL HIGH (ref 78.0–100.0)
Platelets: 141 10*3/uL — ABNORMAL LOW (ref 150.0–400.0)
RBC: 3.53 Mil/uL — ABNORMAL LOW (ref 3.87–5.11)
RDW: 18.7 % — ABNORMAL HIGH (ref 11.5–15.5)
WBC: 9.3 10*3/uL (ref 4.0–10.5)

## 2020-02-06 LAB — LIPASE: Lipase: 26 U/L (ref 11.0–59.0)

## 2020-02-06 LAB — IBC + FERRITIN
Ferritin: 1277 ng/mL — ABNORMAL HIGH (ref 10.0–291.0)
Iron: 39 ug/dL — ABNORMAL LOW (ref 42–145)
Saturation Ratios: 24.2 % (ref 20.0–50.0)
Transferrin: 115 mg/dL — ABNORMAL LOW (ref 212.0–360.0)

## 2020-02-06 LAB — PROTIME-INR
INR: 1.1 ratio — ABNORMAL HIGH (ref 0.8–1.0)
Prothrombin Time: 12.5 s (ref 9.6–13.1)

## 2020-02-06 LAB — TSH: TSH: 2.81 u[IU]/mL (ref 0.35–4.50)

## 2020-02-06 LAB — CORTISOL: Cortisol, Plasma: 17.3 ug/dL

## 2020-02-06 NOTE — Progress Notes (Addendum)
[place] VISIT   Primary Care Provider Bernerd Limbo, MD [address] [place] [address] [phone]  Patient Profile: [name] is a 69 y.o. female with a pmh significant for AAA (status post repair in May 2019), heart failure, CAD (on Plavix), atrial fibrillation (not on anticoagulation), chronic renal insufficiency (now on permanent hemodialysis), peripheral vascular disease (status post bypasses), carotid artery disease (status post CEA and left subclavian stent), on home O2 at night only, hypertension, diabetes, hyperlipidemia, status post cholecystectomy, severe idiopathic pancreatitis (unclear etiology though presumed to have been gallstone related and reason for cholecystectomy) and now with evidence on outside EUS of chronic pancreatitis.  The patient presents to the [facility] for an evaluation and management of problem(s) noted below:  Problem List 1. Idiopathic chronic pancreatitis ([place])   2. Acute on chronic pancreatitis ([place])   3. Generalized abdominal pain   4. Unintentional weight loss     History of Present Illness: Please see initial outpatient consultation note and prior progress note for full details of HPI prior to her change in insurance and subsequent evaluation at digestive health specialists.  When I had last evaluated the patient in early 2020, the patient had MRI/MRCP imaging that was concerning for the potential of pancreatic divisum.  She subsequently had a change of her insurance and can no longer see [place] at [facility].  She was evaluated at digestive health specialists in [place].  She underwent EUS by Dr. William Hamburger of [facility] with findings concerning for potential chronic pancreatitis though based on EUS criteria would not completely be consistent with standardized [place] criteria.  Patient has had multiple episodes of hospital admission for recurrent acute on chronic pancreatitis.   She has had previous ANA and IgG4 was checked which were unremarkable.  She had repeat MRI/MRCP imaging which has not shown evidence of abnormality of the pancreas duct anatomy.  She continues to have significant bouts of pain.  She is required to celiac axis interventions including a block and subsequently a neurolysis with the last neurolysis being performed in April of this year.  She has been on and off of pancreas enzymes but due to cost issues she has not been off.  In August 2020 her weight was noted to be 113 pounds.  She has continued to have significant weight loss over the course of the last few months.  Patient's insurance has been transitioned and she wanted to see his back in our clinic.  We have accepted her.  Interval History The patient has had a significant weight loss since my last evaluation of her back in early 2020.  Based on records she is lost nearly 28 to 30 pounds.  Appetite has been poor.  She has a metallic taste to her mouth.  She has been initiated on cannabinoid treatment in effort of trying to optimize her nutrition but has only been on that for short period time.  She is not been able to afford Creon and was recently transitioned to Zenpep.  However, patient is only taking 20,000 units of lipase from Zenpep 3 times daily total.  This is well below chronic pancreatitis dosing.  She does think that she was doing better with Creon but cannot afford it.  She is never been on patient assistance program.  Patient continues to have abdominal pain on a daily basis.  Even with the celiac block and subsequent neurolysis, she is continued to have issues.  Since her neurolysis,  has had progressive worsening of symptoms.  She is not sure why she has no appetite.  Her pain medication is not prescribed through the GI service.  She was recently evaluated by palliative care for supportive measures but has not transitioned to end-stage disease requiring end-of-life discussions.  She hopes that we  can find a reason for her symptoms.  Although imaging has been concerning now for the potential of underlying chronic pancreatitis and then EUS that had been previously performed was suggestive though not 100% consistent with chronic pancreatitis changes.  The patient continues to be on PPI therapy.  She is on hemodialysis now and in theory could have contrasted CT scans if necessary.  Patient has not had a colonoscopy in years and continues to have weight loss unintentionally.  She has had changes in her bowel habits which have been more loose as result of not being on PERT.  GI Review of Systems Positive as above Negative for odynophagia, dysphagia, melena, hematochezia  Review of Systems General: Denies fevers/chills Cardiovascular: Denies chest pain Pulmonary: Shortness of breath is stable (patient on home O2 at night) Gastroenterological: See HPI Genitourinary: Denies darkened urine Hematological: Positive for easy bruising/bleeding due to Plavix Dermatological: Denies jaundice Psychological: Mood is scared and anxious and hopeful to get better   Medications Current Outpatient Medications  Medication Sig Dispense Refill  . albuterol (VENTOLIN HFA) 108 (90 Base) MCG/ACT inhaler Inhale 1-2 puffs into the lungs every 6 (six) hours as needed for wheezing or shortness of breath.    . allopurinol (ZYLOPRIM) 100 MG tablet Take 100 mg by mouth at bedtime.   2  . atorvastatin (LIPITOR) 40 MG tablet Take 40 mg by mouth at bedtime.     . carvedilol (COREG) 6.25 MG tablet Take 6.25 mg by mouth 2 (two) times daily.    . clopidogrel (PLAVIX) 75 MG tablet Take 75 mg by mouth at bedtime.     . diphenhydrAMINE (BENADRYL) 25 MG tablet Take 50 mg by mouth at bedtime.    . dronabinol (MARINOL) 2.5 MG capsule Take 2.5 mg by mouth 2 (two) times daily before a meal.    . gabapentin (NEURONTIN) 300 MG capsule Take 1 capsule (300 mg total) by mouth at bedtime. 30 capsule 0  . HYDROmorphone (DILAUDID) 2 MG  tablet Take 2-4 mg by mouth every 4 (four) hours.     . OXYGEN Inhale 2 L into the lungs at bedtime.     . Pancrelipase, Lip-Prot-Amyl, (ZENPEP) 20000-63000 units CPEP Take 1 capsule by mouth 3 (three) times daily. 90 capsule 0  . pantoprazole (PROTONIX) 40 MG tablet Take 1 tablet (40 mg total) by mouth daily. (Patient taking differently: Take 40 mg by mouth at bedtime. ) 30 tablet 0  . promethazine (PHENERGAN) 12.5 MG tablet Take 12.5 mg by mouth every 4 (four) hours as needed for nausea or vomiting.    . traZODone (DESYREL) 100 MG tablet Take 150 mg by mouth at bedtime.      No current facility-administered medications for this visit.    Allergies Allergies  Allergen Reactions  . Isosorbide Other (See Comments)    Can only tolerate in low doses (unknown reaction) Other reaction(s): Other Unknown "FELT LIKE I WAS GOING TO EXPLODE FROM INSIDE OUT"  . Codeine Itching  . Contrast Media [Iodinated Diagnostic Agents] Itching and Other (See Comments)    Itching of feet  . Ioxaglate Itching and Other (See Comments)    Itching of feet  . Isosorbide  Nitrate Other (See Comments)    Unknown   . Metrizamide Itching and Other (See Comments)    Itching of feet  . Oxycodone-Acetaminophen Itching  . Percocet [Oxycodone-Acetaminophen] Itching and Other (See Comments)    Tolerates Hydrocodone    Histories Past Medical History:  Diagnosis Date  . AAA (abdominal aortic aneurysm) ([place]) 5/09   3.7x3.3 by u/s 2009  . Abnormal EKG    deep TW inversions chronic  . Anemia   . CAD (coronary artery disease)    a. CABG 03/2011 LIMA to LAD, SVG to OM, SVG to PDA. b. cath 06/25/2015 DES to SVG to rPDA, patent LIMA to LAD, patent SVG to OM  . carotid stenosis 5/09   S/p L CEA ;  60-79% bilat ICA by preCABG dopplers 7/12  . CHF (congestive heart failure) ([place])   . Chronic back pain    "all over" (06/24/2015)  . Chronic bronchitis ([place])    "get it pretty much q yr" (06/24/2015)  . CKD (chronic kidney  disease)   . Complication of anesthesia 1966   "problem w/ether"  . COPD (chronic obstructive pulmonary disease) ([place])   . Depression   . Dysrhythmia   . GERD (gastroesophageal reflux disease)    With hiatal hernia  . GIB (gastrointestinal bleeding) 9/11   S/p EGD with cautery at [facility]  . Heart murmur   . History of blood transfusion "a few times"   "all related to anemia" (06/24/2015)  . History of hiatal hernia   . Hyperlipidemia   . Hypertension   . Mitral regurgitation    3+ MR by intraoperative TEE;  s/p MV repair with Dr. Roxy Manns 7/12  . Myocardial infarction [facility]) 2012 "several"  . On home oxygen therapy    "2 liters at night; negative for sleep apnea"  . PAD (peripheral artery disease) ([facility])    Severe; s/p bilateral renal artery stents, moderate in-stent restenosis  . Pancreatitis 05/2018  . Paroxysmal atrial fibrillation ([facility])    coumadin;  echo 9/07: EF 60%, mild LVH;  s/p Cox Maze 7/12 with LAA clipping  . Subclavian artery stenosis, left ([facility])    stented by Dr. Trula Slade on 10/18 to help flow of her LIMA to LAD  . Type II diabetes mellitus ([place])    Past Surgical History:  Procedure Laterality Date  . A/V FISTULAGRAM Left 03/21/2018   Procedure: A/V FISTULAGRAM;  Surgeon: Serafina Mitchell, MD;  Location: [place] [facility];  Service: Cardiovascular;  Laterality: Left;  . ABDOMINAL AORTIC ENDOVASCULAR STENT GRAFT N/A 01/17/2018   Procedure: ABDOMINAL AORTIC ENDOVASCULAR STENT GRAFT WITH CO2;  Surgeon: Serafina Mitchell, MD;  Location: [place];  Service: Vascular;  Laterality: N/A;  . ABDOMINAL HYSTERECTOMY  1990's  . APPENDECTOMY  Aug. 11, 2016   Ruptured  . AV FISTULA PLACEMENT Left 11/03/2017   Procedure: ARTERIOVENOUS (AV) FISTULA CREATION LEFT ARM;  Surgeon: Serafina Mitchell, MD;  Location: [place];  Service: Vascular;  Laterality: Left;  . BOWEL RESECTION  2013  . CARDIAC CATHETERIZATION N/A 06/24/2015   Procedure: Left Heart Cath and Cors/Grafts Angiography;  Surgeon: Leonie Man, MD;  Location: [place] CV [facility];  Service: Cardiovascular;  Laterality: N/A;  . CARDIAC CATHETERIZATION N/A 06/25/2015   Procedure: Coronary Stent Intervention;  Surgeon: Sherren Mocha, MD;  Location: [place] [facility];  Service: Cardiovascular;  Laterality: N/A;  . CAROTID ENDARTERECTOMY Left   . CATARACT EXTRACTION Right 03/27/2018  . CHOLECYSTECTOMY N/A 04/19/2018  Procedure: LAPAROSCOPIC CHOLECYSTECTOMY WITH INTRAOPERATIVE CHOLANGIOGRAM;  Surgeon: Rolm Bookbinder, MD;  Location: [place];  Service: General;  Laterality: N/A;  . CORONARY ANGIOPLASTY    . CORONARY ARTERY BYPASS GRAFT  03/05/2011   CABG X3 (LIMA to LAD, SVG to OM, SVG to PDA, EVH via left thigh  . CORONARY STENT PLACEMENT    . ESOPHAGOGASTRODUODENOSCOPY N/A 04/12/2018   Procedure: ESOPHAGOGASTRODUODENOSCOPY (EGD);  Surgeon: [name], MD;  Location: [facility];  Service: Endoscopy;  Laterality: N/A;  . ESOPHAGOGASTRODUODENOSCOPY N/A 06/16/2018   Procedure: ESOPHAGOGASTRODUODENOSCOPY (EGD);  Surgeon: Thornton Park, MD;  Location: [place];  Service: Gastroenterology;  Laterality: N/A;  . IR AV DIALY SHUNT INTRO NEEDLE/INTRACATH INITIAL W/PTA/IMG LEFT  01/23/2020  . IR RADIOLOGIST EVAL & MGMT  09/13/2019  . IR RADIOLOGIST EVAL & MGMT  10/31/2019  . IR RADIOLOGIST EVAL & MGMT  11/14/2019  . IR RADIOLOGIST EVAL & MGMT  01/02/2020  . IR US GUIDE VASC ACCESS LEFT  01/23/2020  . LACERATION REPAIR Right 1990's   WRIST  . MAZE  03/05/2011   complete biatrial lesion set with clipping of LA appendage  . MITRAL VALVE REPAIR  03/05/2011   31mm Memo 3D ring annuloplasty for ischemic MR  . PERIPHERAL VASCULAR BALLOON ANGIOPLASTY Left 03/21/2018   Procedure: PERIPHERAL VASCULAR BALLOON ANGIOPLASTY;  Surgeon: Serafina Mitchell, MD;  Location: [place] [facility];  Service: Cardiovascular;  Laterality: Left;  . PERIPHERAL VASCULAR CATHETERIZATION N/A 06/24/2015   Procedure: Aortic Arch Angiography;  Surgeon: Serafina Mitchell, MD;  Location: [place] [facility];  Service: Cardiovascular;  Laterality: N/A;  . PERIPHERAL VASCULAR CATHETERIZATION  06/24/2015   Procedure: Peripheral Vascular Intervention;  Surgeon: Serafina Mitchell, MD;  Location: [place] [facility];  Service: Cardiovascular;;  . PERIPHERAL VASCULAR CATHETERIZATION N/A 06/24/2015   Procedure: Abdominal Aortogram;  Surgeon: Serafina Mitchell, MD;  Location: [place] [facility];  Service: Cardiovascular;  Laterality: N/A;  . RENAL ANGIOGRAM Bilateral 09/11/2013   Procedure: RENAL ANGIOGRAM;  Surgeon: Serafina Mitchell, MD;  Location: [facility];  Service: Cardiovascular;  Laterality: Bilateral;  . RIGHT FEMORAL-POPLITEAL BYPASS     Social History   Socioeconomic History  . Marital status: Widowed    Spouse name: Not on file  . Number of children: Not on file  . Years of education: Not on file  . Highest education level: Not on file  Occupational History  . Occupation: Scientist, water quality in past    Employer: DISABLED  Tobacco Use  . Smoking status: Current Every Day Smoker    Packs/day: 0.75    Years: 50.00    Pack years: 37.50    Types: Cigarettes  . Smokeless tobacco: Never Used  . Tobacco comment: 3/4 pk per day  Substance and Sexual Activity  . Alcohol use: Yes    Alcohol/week: 0.0 standard drinks  . Drug use: Yes    Types: Marijuana  . Sexual activity: Not Currently    Birth control/protection: None  Other Topics Concern  . Not on file  Social History Narrative   Widowed in 2009.   Social Determinants of Health   Financial Resource Strain:   . Difficulty of Paying Living Expenses:   Food Insecurity:   . Worried About Charity fundraiser in the Last Year:   . Arboriculturist in the Last Year:   Transportation Needs:   . Film/video editor (Medical):   Marland Kitchen Lack of Transportation (Non-Medical):   Physical Activity:   .  Days of Exercise per Week:   . Minutes of Exercise per Session:   Stress:   . Feeling of Stress :   Social  Connections:   . Frequency of Communication with Friends and Family:   . Frequency of Social Gatherings with Friends and Family:   . Attends Religious Services:   . Active Member of Clubs or Organizations:   . Attends Archivist Meetings:   Marland Kitchen Marital Status:   Intimate Partner Violence:   . Fear of Current or Ex-Partner:   . Emotionally Abused:   Marland Kitchen Physically Abused:   . Sexually Abused:    Family History  Problem Relation Age of Onset  . Emphysema Mother   . Heart disease Mother        before age 50  . Hypertension Mother   . Hyperlipidemia Mother   . Heart attack Mother   . AAA (abdominal aortic aneurysm) Mother        rupture  . Heart attack Father 47  . Emphysema Father   . Heart disease Father        before age 41  . Hyperlipidemia Father   . Hypertension Father   . Peripheral vascular disease Father   . Diabetes Brother   . Heart disease Brother        before age 54  . Hyperlipidemia Brother   . Hypertension Brother   . Heart attack Brother        CABG  . Heart disease Other        Vascular disease in grandparents, uncles and dad  . Colon cancer Neg Hx   . Esophageal cancer Neg Hx   . Inflammatory bowel disease Neg Hx   . Liver disease Neg Hx   . Pancreatic cancer Neg Hx   . Rectal cancer Neg Hx   . Stomach cancer Neg Hx    I have reviewed her medical, social, and family history in detail and updated the electronic medical record as necessary.    PHYSICAL EXAMINATION  BP (!) 90/50   Pulse 92   Ht 5\' 5"  (1.651 m)   Wt 88 lb (39.9 kg)   BMI 14.64 kg/m  Wt Readings from Last 3 Encounters:  02/06/20 88 lb (39.9 kg)  01/23/20 94 lb 12.8 oz (43 kg)  01/19/20 95 lb (43.1 kg)  GEN: Chronically ill-appearing and cachectic, daughter at her side, no acute distress, nontoxic PSYCH: Cooperative, without pressured speech EYE: Conjunctivae pale-pink, sclerae anicteric ENT: MMM CV: Nontachycardic RESP: No appreciable wheezing GI: NABS, soft, diffuse  tenderness to palpation throughout the abdomen mostly in the mid abdomen, without rebound MSK/EXT: Bilateral lower extremity edema present SKIN: No jaundice NEURO:  Alert & Oriented x 3, no focal deficits   REVIEW OF DATA  I reviewed the following data at the time of this encounter:  GI Procedures and Studies  Upper endoscopy showed grossly normal esophagus.  Mild erythema noted of the gastric antrum without ulcers.  The duodenum has the appearance of melanosis.  Biopsies taken. Radial EUS exam showed hypoechoic nodular appearing pancreas parenchyma throughout the nondilated main pancreatic duct measuring 2.5 mm in the head of the pancreas and 1 mm in the body and tail.  Could not clearly delineate pancreas to be sent.  Calcifications present in the pancreas head region. Common bile duct measures 8 mm and tapers smoothly to the ampulla without stones or sludge. No significant abdominal lymphadenopathy. Abdominal blood vessels appear intact. Periesophageal structures intact.  Laboratory  Studies  Reviewed in epic and care everywhere  Imaging Studies  February 2021 CT abdomen pelvis without contrast IMPRESSION: 1. New small RIGHT pleural effusion. 2. Stable appearance of the enlarged pancreatic head, with associated coarse calcifications and pancreatic duct dilatation, and with small amount of fluid stranding again seen within the peripancreatic soft tissues corresponding to recent episode of acute pancreatitis (acute on chronic pancreatitis). 3. Walls of the adjacent duodenum again appear thickened, likely reactive in nature. 4. Aorta bi-iliac stents in place. 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 No periaortic hemorrhage or edema appreciated. 5. Bilateral nephrolithiasis. No hydronephrosis. No ureteral or bladder calculi. 6. No bowel obstruction. No secondary signs of bowel wall ischemia. No abscess collection or free intraperitoneal air. Aortic  Atherosclerosis (ICD10-I70.0).  June 2020 MRI/MRCP without contrast IMPRESSION: Unremarkable pancreas. Normal expected pancreatic duct anatomy. No biliary dilatation   ASSESSMENT  Ms. Wenk is a 69 y.o. female with a pmh significant for AAA (status post repair in May 2019), heart failure, CAD (on Plavix), atrial fibrillation (not on anticoagulation), chronic renal insufficiency (now on permanent hemodialysis), peripheral vascular disease (status post bypasses), carotid artery disease (status post CEA and left subclavian stent), on home O2 at night only, hypertension, diabetes, hyperlipidemia, status post cholecystectomy, severe idiopathic pancreatitis (unclear etiology though presumed to have been gallstone related and reason for cholecystectomy) and now with evidence on outside EUS of chronic pancreatitis.   The patient is seen today for evaluation and management of:  1. Idiopathic chronic pancreatitis ([place])   2. Acute on chronic pancreatitis ([place])   3. Generalized abdominal pain   4. Unintentional weight loss    The patient is hemodynamically stable.  However, patient's clinical situation such that she continues to have idiopathic episodes of pancreatitis of an unclear etiology for which there is now concern that she may have developed chronic pancreatitis changes.  She does not meet to [place] criteria for absolute EUS imaging at this time that suggest chronic pancreatitis but with pancreatic calcifications in the parenchyma being noted is highly concerning.  But having said, does not seem that she has pancreatic divisum based on repeat imaging as well as an outside EUS at this time so there is no role at this time to consider a minor stricturotomy/papillotomy.  I am going to try to get the patient, patient assistance for the Creon/AbbVie.  We will plan to get her up to 72,000 units of lipase and we may not have much when after that due to her significant weight but is something we can consider.   We discussed the role of significant improvement in her nutrition.  She may continue the cannabinoid therapy has been initiated by her doctors.  She should initiate protein shakes to have at least 60 to 80 g of protein per day.  I am not sure that we will ever find the true cause of her symptoms we will certainly can consider the role of genetics testing in the future.  We will she does with trying to get her on Creon rather than Zenpep.  However if unable to get Creon patient assistance then we will certainly consider Zenpep.  She would not be a great candidate for repeat celiac neurolysis as I think it would be high risk as has been discussed in detail by interventional radiology.  We will try to get her CDs of her outside facility so that they can be uploaded into the chart.  I plan a colonoscopy for  evaluation of unintentional weight loss she is not up-to-date although no imaging going back to February suggestive issues.  May consider the role of repeat cross-sectional CT or MRI imaging to reevaluate the pancreas although a pancreas protocol CT abdomen/pelvis with IV contrast may be of most help.  No plan for EUS at this time.  No plan for ERCP at this time.  All patient questions were answered, to the best of my ability, and the patient agrees to the aforementioned plan of action with follow-up as indicated.   PLAN  Try to get patient assistance for Creon 72,000 units with each meal and 36,000 units with each snack Continue cannabinoid therapy as per treating physician Monitor weight loss. Colonoscopy for evaluation of unintentional weight loss Hopefully the elevated PERT will help with her symptoms For now continue Zenpep at current dosing (from a financial perspective this is all she can avoid) May consider repeat cross-sectional CT abdomen/pelvis as pancreas protocol with IV contrast in future Hold on EUS for now but may consider the role to try to clarify it a true diagnosis of prior pancreatitis  or not likely after the CT scan is considered or performed We will see her back in a few weeks to determine the role of this Laboratories as outlined below We briefly discussed potential role for PEG tube but are holding on that currently   Orders Placed This Encounter  Procedures  . CBC  . Basic Metabolic Panel (BMET)  . Lipase  . TSH  . Cortisol  . IBC + Ferritin  . INR/PT  . Prealbumin    New Prescriptions   No medications on file   Modified Medications   No medications on file    Planned Follow Up: No follow-ups on file.   Total Time in Face-to-Face and in Coordination of Care for patient including independent/personal interpretation/review of prior testing, medical history, examination, medication adjustment, communicating results with the patient directly, and documentation with the EHR is 35 minutes.   Justice Britain, MD [facility] Office # [phone]

## 2020-02-06 NOTE — Patient Instructions (Addendum)
If you are age 67 or older, your body mass index should be between 23-30. Your Body mass index is 14.64 kg/m. If this is out of the aforementioned range listed, please consider follow up with your Primary Care Provider.  If you are age 35 or younger, your body mass index should be between 19-25. Your Body mass index is 14.64 kg/m. If this is out of the aformentioned range listed, please consider follow up with your Primary Care Provider.   It has been recommended to you by your physician that you have a(n) Colon/Endo at hospital completed. Per your request, we did not schedule the procedure(s) today. Please contact our office at [phone] should you decide to have the procedure completed.   You will be contaced by our office prior to your procedure for directions on holding your Plavix. If you do not hear from our office 1 week prior to your scheduled procedure, please call [phone] to discuss.  Due to recent changes in healthcare laws, you may see the results of your imaging and laboratory studies on MyChart before your provider has had a chance to review them.  We understand that in some cases there may be results that are confusing or concerning to you. Not all laboratory results come back in the same time frame and the provider may be waiting for multiple results in order to interpret others.  Please give [place] 48 hours in order for your provider to thoroughly review all the results before contacting the office for clarification of your results.   Continue Zenpep as directed. We will send request for patient assistance to the Nurse Ambassador Program to see if we can get you some patient assistance for Creon. If Creon is cheaper for you then we will discontinue Zenpep and have you start Creon.  Thank you for choosing me and [facility].  Dr. Rush Landmark

## 2020-02-07 ENCOUNTER — Telehealth: Payer: Self-pay

## 2020-02-07 ENCOUNTER — Other Ambulatory Visit: Payer: Self-pay

## 2020-02-07 LAB — PREALBUMIN: Prealbumin: 15 mg/dL — ABNORMAL LOW (ref 17–34)

## 2020-02-07 MED ORDER — PANCRELIPASE (LIP-PROT-AMYL) 36000-114000 UNITS PO CPEP: ORAL_CAPSULE | ORAL | 4 refills | Status: DC

## 2020-02-07 NOTE — Telephone Encounter (Signed)
Clearance letter faxed to Dr. Coletta Memos office asking for clearance to hold plavix x5 days prior to procedures (TBD).

## 2020-02-07 NOTE — Progress Notes (Signed)
Rx for Creon sent to Nurse Ambassador Enrollment program- if pt can receive better assistance we will d/c Zenpap and start Creon.

## 2020-02-22 ENCOUNTER — Encounter: Payer: Self-pay | Admitting: Cardiovascular Disease

## 2020-02-22 ENCOUNTER — Ambulatory Visit: Payer: Medicare Other | Admitting: Cardiovascular Disease

## 2020-02-22 ENCOUNTER — Other Ambulatory Visit: Payer: Self-pay

## 2020-02-22 VITALS — BP 84/48 | HR 76 | Ht 65.0 in | Wt 85.6 lb

## 2020-02-22 DIAGNOSIS — I340 Nonrheumatic mitral (valve) insufficiency: Secondary | ICD-10-CM | POA: Diagnosis not present

## 2020-02-22 DIAGNOSIS — I25119 Atherosclerotic heart disease of native coronary artery with unspecified angina pectoris: Secondary | ICD-10-CM

## 2020-02-22 DIAGNOSIS — I480 Paroxysmal atrial fibrillation: Secondary | ICD-10-CM | POA: Diagnosis not present

## 2020-02-22 MED ORDER — NITROGLYCERIN 0.4 MG SL SUBL
0.4000 mg | SUBLINGUAL_TABLET | SUBLINGUAL | 1 refills | Status: DC | PRN
Start: 2020-02-22 — End: 2020-05-05

## 2020-02-22 MED ORDER — METOPROLOL TARTRATE 25 MG PO TABS
12.5000 mg | ORAL_TABLET | Freq: Two times a day (BID) | ORAL | 3 refills | Status: DC
Start: 2020-02-22 — End: 2020-05-05

## 2020-02-22 NOTE — Progress Notes (Signed)
Cardiology Office Note:    Date:  02/22/2020   ID:  [name], DOB September 21, 1950, MRN [id number]  PCP:  Bernerd Limbo, MD  [facility] Cardiologist:  Sherren Mocha, MD  [facility] Electrophysiologist:  None   Referring MD: Bernerd Limbo, MD   Chief Complaint  Patient presents with   Chest Pain    History of Present Illness:    [name] is a 69 y.o. female with a hx of coronary artery disease status post CABG and mitral valve repair in 2012 along with cryo-Cox-Maze surgery and left atrial appendage clipping.  She has had extensive peripheral arterial disease, history of renal artery stenting, carotid endarterectomy, failed lower extremity bypass surgery, and left subclavian stenting.  The patient has a history of chronic kidney disease and has progressed to end-stage renal disease now on hemodialysis.  She presents today for follow-up evaluation.  I have not seen her since 2018.  She has developed problems with chronic recurrent pancreatitis and has had significant unintentional weight loss with her weight now down to 88 pounds.  She is here with her daughter today. She had an episode recently when her heart was racing, episode lasted less than 10 minutes she had associated chest discomfort with this. No other recent episodes of chest pain. No shortness of breath with exertion. She has been on hemodialysis now for over one year. Reports very low BP after dialysis. She had one episode of syncope that occurred after a prodrome of lightheadedness and 'rubbery legs.' She dialyzes Tuesday, Thursday, Saturday. She holds coreg on the morning of dialysis.   Past Medical History:  Diagnosis Date   AAA (abdominal aortic aneurysm) ([place]) 5/09   3.7x3.3 by u/s 2009   Abnormal EKG    deep TW inversions chronic   Anemia    CAD (coronary artery disease)    a. CABG 03/2011 LIMA to LAD, SVG to OM, SVG to PDA. b. cath 06/25/2015 DES to SVG to rPDA, patent LIMA to LAD, patent SVG to OM    carotid stenosis 5/09   S/p L CEA ;  60-79% bilat ICA by preCABG dopplers 7/12   CHF (congestive heart failure) (HCC)    Chronic back pain    "all over" (06/24/2015)   Chronic bronchitis ([place])    "get it pretty much q yr" (06/24/2015)   CKD (chronic kidney disease)    Complication of anesthesia 1966   "problem w/ether"   COPD (chronic obstructive pulmonary disease) ([place])    Depression    Dysrhythmia    GERD (gastroesophageal reflux disease)    With hiatal hernia   GIB (gastrointestinal bleeding) 9/11   S/p EGD with cautery at [facility]   Heart murmur    History of blood transfusion "a few times"   "all related to anemia" (06/24/2015)   History of hiatal hernia    Hyperlipidemia    Hypertension    Mitral regurgitation    3+ MR by intraoperative TEE;  s/p MV repair with Dr. Roxy Manns 7/12   Myocardial infarction [facility]) 2012 "several"   On home oxygen therapy    "2 liters at night; negative for sleep apnea"   PAD (peripheral artery disease) ([place])    Severe; s/p bilateral renal artery stents, moderate in-stent restenosis   Pancreatitis 05/2018   Paroxysmal atrial fibrillation (HCC)    coumadin;  echo 9/07: EF 60%, mild LVH;  s/p Cox Maze 7/12 with LAA clipping   Subclavian artery stenosis, left ([place])  stented by Dr. Trula Slade on 10/18 to help flow of her LIMA to LAD   Type II diabetes mellitus [facility])     Past Surgical History:  Procedure Laterality Date   A/V FISTULAGRAM Left 03/21/2018   Procedure: A/V FISTULAGRAM;  Surgeon: Serafina Mitchell, MD;  Location: [place] [facility];  Service: Cardiovascular;  Laterality: Left;   ABDOMINAL AORTIC ENDOVASCULAR STENT GRAFT N/A 01/17/2018   Procedure: ABDOMINAL AORTIC ENDOVASCULAR STENT GRAFT WITH CO2;  Surgeon: Serafina Mitchell, MD;  Location: [place];  Service: Vascular;  Laterality: N/A;   ABDOMINAL HYSTERECTOMY  1990's   APPENDECTOMY  Aug. 11, 2016   Ruptured   AV FISTULA PLACEMENT Left 11/03/2017   Procedure:  ARTERIOVENOUS (AV) FISTULA CREATION LEFT ARM;  Surgeon: Serafina Mitchell, MD;  Location: [place];  Service: Vascular;  Laterality: Left;   BOWEL RESECTION  2013   CARDIAC CATHETERIZATION N/A 06/24/2015   Procedure: Left Heart Cath and Cors/Grafts Angiography;  Surgeon: Leonie Man, MD;  Location: [place] [facility];  Service: Cardiovascular;  Laterality: N/A;   CARDIAC CATHETERIZATION N/A 06/25/2015   Procedure: Coronary Stent Intervention;  Surgeon: Sherren Mocha, MD;  Location: [place] [facility];  Service: Cardiovascular;  Laterality: N/A;   CAROTID ENDARTERECTOMY Left    CATARACT EXTRACTION Right 03/27/2018   CHOLECYSTECTOMY N/A 04/19/2018   Procedure: LAPAROSCOPIC CHOLECYSTECTOMY WITH INTRAOPERATIVE CHOLANGIOGRAM;  Surgeon: Rolm Bookbinder, MD;  Location: [place];  Service: General;  Laterality: N/A;   CORONARY ANGIOPLASTY     CORONARY ARTERY BYPASS GRAFT  03/05/2011   CABG X3 (LIMA to LAD, SVG to OM, SVG to PDA, EVH via left thigh   CORONARY STENT PLACEMENT     ESOPHAGOGASTRODUODENOSCOPY N/A 04/12/2018   Procedure: ESOPHAGOGASTRODUODENOSCOPY (EGD);  Surgeon: [name], MD;  Location: [facility];  Service: Endoscopy;  Laterality: N/A;   ESOPHAGOGASTRODUODENOSCOPY N/A 06/16/2018   Procedure: ESOPHAGOGASTRODUODENOSCOPY (EGD);  Surgeon: Thornton Park, MD;  Location: [place];  Service: Gastroenterology;  Laterality: N/A;   IR AV DIALY SHUNT INTRO NEEDLE/INTRACATH INITIAL W/PTA/IMG LEFT  01/23/2020   IR RADIOLOGIST EVAL & MGMT  09/13/2019   IR RADIOLOGIST EVAL & MGMT  10/31/2019   IR RADIOLOGIST EVAL & MGMT  11/14/2019   IR RADIOLOGIST EVAL & MGMT  01/02/2020   IR US GUIDE VASC ACCESS LEFT  01/23/2020   LACERATION REPAIR Right 1990's   WRIST   MAZE  03/05/2011   complete biatrial lesion set with clipping of LA appendage   MITRAL VALVE REPAIR  03/05/2011   78mm Memo 3D ring annuloplasty for ischemic MR   PERIPHERAL VASCULAR BALLOON ANGIOPLASTY Left 03/21/2018    Procedure: PERIPHERAL VASCULAR BALLOON ANGIOPLASTY;  Surgeon: Serafina Mitchell, MD;  Location: [place] [facility];  Service: Cardiovascular;  Laterality: Left;   PERIPHERAL VASCULAR CATHETERIZATION N/A 06/24/2015   Procedure: Aortic Arch Angiography;  Surgeon: Serafina Mitchell, MD;  Location: [place] [facility];  Service: Cardiovascular;  Laterality: N/A;   PERIPHERAL VASCULAR CATHETERIZATION  06/24/2015   Procedure: Peripheral Vascular Intervention;  Surgeon: Serafina Mitchell, MD;  Location: [place] [facility];  Service: Cardiovascular;;   PERIPHERAL VASCULAR CATHETERIZATION N/A 06/24/2015   Procedure: Abdominal Aortogram;  Surgeon: Serafina Mitchell, MD;  Location: [place] [facility];  Service: Cardiovascular;  Laterality: N/A;   RENAL ANGIOGRAM Bilateral 09/11/2013   Procedure: RENAL ANGIOGRAM;  Surgeon: Serafina Mitchell, MD;  Location: [facility] C[facility] LAB;  Service: Cardiovascular;  Laterality: Bilateral;   RIGHT FEMORAL-POPLITEAL BYPASS  Current Medications: Current Meds  Medication Sig   albuterol (VENTOLIN HFA) 108 (90 Base) MCG/ACT inhaler Inhale 1-2 puffs into the lungs every 6 (six) hours as needed for wheezing or shortness of breath.   allopurinol (ZYLOPRIM) 100 MG tablet Take 100 mg by mouth at bedtime.    atorvastatin (LIPITOR) 40 MG tablet Take 40 mg by mouth at bedtime.    clopidogrel (PLAVIX) 75 MG tablet Take 75 mg by mouth at bedtime.    diphenhydrAMINE (BENADRYL) 25 MG tablet Take 50 mg by mouth at bedtime.   dronabinol (MARINOL) 2.5 MG capsule Take 2.5 mg by mouth 2 (two) times daily before a meal.   gabapentin (NEURONTIN) 300 MG capsule Take 1 capsule (300 mg total) by mouth at bedtime.   HYDROmorphone (DILAUDID) 2 MG tablet Take 2-4 mg by mouth every 4 (four) hours.    OXYGEN Inhale 2 L into the lungs at bedtime.    Pancrelipase, Lip-Prot-Amyl, (ZENPEP) 20000-63000 units CPEP Take 1 capsule by mouth 3 (three) times daily.   pantoprazole (PROTONIX) 40 MG tablet  Take 1 tablet (40 mg total) by mouth daily.   promethazine (PHENERGAN) 12.5 MG tablet Take 12.5 mg by mouth every 4 (four) hours as needed for nausea or vomiting.   traZODone (DESYREL) 100 MG tablet Take 150 mg by mouth at bedtime.    [DISCONTINUED] carvedilol (COREG) 6.25 MG tablet Take 6.25 mg by mouth 2 (two) times daily.     Allergies:   Isosorbide, Codeine, Contrast media [iodinated diagnostic agents], Ioxaglate, Isosorbide nitrate, Metrizamide, Oxycodone-acetaminophen, and Percocet [oxycodone-acetaminophen]   Social History   Socioeconomic History   Marital status: Widowed    Spouse name: Not on file   Number of children: Not on file   Years of education: Not on file   Highest education level: Not on file  Occupational History   Occupation: Scientist, water quality in past    Employer: DISABLED  Tobacco Use   Smoking status: Current Every Day Smoker    Packs/day: 0.75    Years: 50.00    Pack years: 37.50    Types: Cigarettes   Smokeless tobacco: Never Used   Tobacco comment: 3/4 pk per day  Vaping Use   Vaping Use: Never used  Substance and Sexual Activity   Alcohol use: Yes    Alcohol/week: 0.0 standard drinks   Drug use: Yes    Types: Marijuana   Sexual activity: Not Currently    Birth control/protection: None  Other Topics Concern   Not on file  Social History Narrative   Widowed in 2009.   Social Determinants of Health   Financial Resource Strain:    Difficulty of Paying Living Expenses:   Food Insecurity:    Worried About Charity fundraiser in the Last Year:    Arboriculturist in the Last Year:   Transportation Needs:    Film/video editor (Medical):    Lack of Transportation (Non-Medical):   Physical Activity:    Days of Exercise per Week:    Minutes of Exercise per Session:   Stress:    Feeling of Stress :   Social Connections:    Frequency of Communication with Friends and Family:    Frequency of Social Gatherings with Friends and  Family:    Attends Religious Services:    Active Member of Clubs or Organizations:    Attends Archivist Meetings:    Marital Status:      Family History: The patient's family history  includes AAA (abdominal aortic aneurysm) in her mother; Diabetes in her brother; Emphysema in her father and mother; Heart attack in her brother and mother; Heart attack (age of onset: 57) in her father; Heart disease in her brother, father, mother, and another family member; Hyperlipidemia in her brother, father, and mother; Hypertension in her brother, father, and mother; Peripheral vascular disease in her father. There is no history of Colon cancer, Esophageal cancer, Inflammatory bowel disease, Liver disease, Pancreatic cancer, Rectal cancer, or Stomach cancer.  ROS:   Please see the history of present illness.    Weight loss, weakness All other systems reviewed and are negative.  EKGs/Labs/Other Studies Reviewed:    The following studies were reviewed today: Echo 05-30-2018: Study Conclusions   - Left ventricle: The cavity size was mildly dilated. There was  mild focal basal hypertrophy of the septum. Systolic function was  normal. The estimated ejection fraction was in the range of 60%  to 65%. Wall motion was normal; there were no regional wall  motion abnormalities. Features are consistent with a pseudonormal  left ventricular filling pattern, with concomitant abnormal  relaxation and increased filling pressure (grade 2 diastolic  dysfunction). Doppler parameters are consistent with high  ventricular filling pressure.  - Mitral valve: S/P mitral valve repair with moderate mitral  stenosis. There was mild regurgitation. Mean gradient (D): 9 mm  Hg. Valve area by pressure half-time: 2.24 cm^2. Valve area by  continuity equation (using LVOT flow): 1.17 cm^2.  - Left atrium: The atrium was mildly dilated.  - Pulmonary arteries: PA peak pressure: 36 mm Hg  (S).  EKG:  EKG is not ordered today.    Recent Labs: 10/21/2019: Magnesium 1.4 01/21/2020: ALT 21 02/06/2020: BUN 15; Creatinine, Ser 2.10; Hemoglobin 12.3; Platelets 141.0; Potassium 3.1; Sodium 139; TSH 2.81  Recent Lipid Panel    Component Value Date/Time   CHOL 85 05/29/2018 0335   TRIG 135 05/29/2018 0335   HDL 20 (L) 05/29/2018 0335   CHOLHDL 4.3 05/29/2018 0335   VLDL 27 05/29/2018 0335   LDLCALC 38 05/29/2018 0335   LDLDIRECT 78.9 05/07/2008 1301    Physical Exam:    VS:  BP (!) 84/48 (BP Location: Right Arm, Patient Position: Sitting, Cuff Size: Small)    Pulse 76    Ht 5\' 5"  (1.651 m)    Wt 85 lb 9.6 oz (38.8 kg)    SpO2 94%    BMI 14.24 kg/m     Wt Readings from Last 3 Encounters:  02/22/20 85 lb 9.6 oz (38.8 kg)  02/06/20 88 lb (39.9 kg)  01/23/20 94 lb 12.8 oz (43 kg)     GEN: frail appearing, cachectic woman in no acute distress HEENT: Normal NECK: No JVD; No carotid bruits LYMPHATICS: No lymphadenopathy CARDIAC: RRR, soft systolic murmur at LLSB RESPIRATORY:  Clear to auscultation without rales, wheezing or rhonchi  ABDOMEN: Soft, non-tender, non-distended MUSCULOSKELETAL:  1+ bilateral ankle edema; No deformity  SKIN: Warm and dry NEUROLOGIC:  Alert and oriented x 3 PSYCHIATRIC:  Normal affect   ASSESSMENT:    1. Paroxysmal atrial fibrillation (HCC)   2. Coronary artery disease involving native coronary artery of native heart with angina pectoris ([place])   3. Nonrheumatic mitral valve regurgitation    PLAN:    In order of problems listed above:  1. One recent episode of palpitations. Not a candidate for anticoagulation with extreme frailty and low body weight/fall risk. Change carvedilol to metoprolol 12.5 mg BID. Her BP  is too low and reflects severe protein calorie malnutrition. Stop carvedilol ---> change to low dose metoprolol.  2. One episode of chest discomfort associated with tachy-palpitations noted. Continue clopidogrel.  3. Exam stable. Last  echo showed normal function of her mitral valve.   Tough situation. She has really declined since I last saw her. She will hold metoprolol on mornings of dialysis. Follow-up 6 months.  Medication Adjustments/Labs and Tests Ordered: Current medicines are reviewed at length with the patient today.  Concerns regarding medicines are outlined above.  No orders of the defined types were placed in this encounter.  Meds ordered this encounter  Medications   metoprolol tartrate (LOPRESSOR) 25 MG tablet    Sig: Take 0.5 tablets (12.5 mg total) by mouth 2 (two) times daily. Do not take in the mornings when you have dialysis.    Dispense:  90 tablet    Refill:  3   nitroGLYCERIN (NITROSTAT) 0.4 MG SL tablet    Sig: Place 1 tablet (0.4 mg total) under the tongue every 5 (five) minutes as needed for chest pain.    Dispense:  25 tablet    Refill:  1    Patient Instructions  Medication Instructions:  1) STOP CARVEDILOL 2) START METOPROLOL 12.5 mg twice daily. Do NOT take in the mornings when you have dialysis. *If you need a refill on your cardiac medications before your next appointment, please call your pharmacy*  Follow-Up: Your provider recommends that you schedule a follow-up appointment in 6 months with Dr. Burt Knack.      Signed, Sherren Mocha, MD  02/22/2020 7:42 PM    [place]

## 2020-02-22 NOTE — Patient Instructions (Signed)
Medication Instructions:  1) STOP CARVEDILOL 2) START METOPROLOL 12.5 mg twice daily. Do NOT take in the mornings when you have dialysis. *If you need a refill on your cardiac medications before your next appointment, please call your pharmacy*  Follow-Up: Your provider recommends that you schedule a follow-up appointment in 6 months with Dr. Burt Knack.

## 2020-02-25 ENCOUNTER — Other Ambulatory Visit: Payer: Medicare Other | Admitting: Primary Care

## 2020-02-25 ENCOUNTER — Other Ambulatory Visit: Payer: Self-pay

## 2020-02-25 DIAGNOSIS — E43 Unspecified severe protein-calorie malnutrition: Secondary | ICD-10-CM

## 2020-02-25 DIAGNOSIS — Z515 Encounter for palliative care: Secondary | ICD-10-CM

## 2020-02-25 NOTE — Progress Notes (Signed)
[facility] Consult Note Telephone: [phone]  Fax: [phone]  PATIENT NAME: [name] [address] [place] [address] [phone] (home)  DOB: Feb 02, 1951 MRN: [id number]  PRIMARY CARE PROVIDER:    Bernerd Limbo, MD,  [place] [address] [place] [address] [phone]  REFERRING PROVIDER:   Bernerd Limbo, MD [place],  [place] [address] [phone]  RESPONSIBLE PARTY:   Extended Emergency Contact Information Primary Emergency Contact: [facility] Address: [place], [place] [address] Johnnette Litter of [place] Phone: [phone] Relation: Daughter Secondary Emergency Contact: Tyler,Shannon Address: [address]          [place], [place] [address] Johnnette Litter of [place] Mobile Phone: [phone] Relation: Daughter  I met with patient in home.  ASSESSMENT AND RECOMMENDATIONS:   1. Advance Care Planning/Goals of Care: Goals include to maximize quality of life and symptom management. She has not spoken with her daughters yet. She states she wants everything possible done. Will continue to address MOST form and goals of care in face of debilitation illness.  2. Symptom Management:   Skin break down: Right ischium and sacral ulcers, non stagable and stage 1. Applied foam dressing with silicone borders. I'll call home health nurse to discuss. This breakdown is harbinger of decline in the face of poor intake and wt loss.  She also asks about skin tear. Recommend vaseline dressing and silicone border.  Nutrition: Very poor,lungs clear with isolated lll rhonchi,  encouraged to hydrate due to hypotension.  Has lost 3 lbs in 2 weeks, and 16% since December 2020 ( 6 Months)  Continued to discuss smoking cessation.  3. Family /Caregiver/Community Supports:  Applying for [employer] menus. Application taken today. Daughter is POA and assists. HD 3  days a week.  4. Cognitive / Functional decline: Cognitive baseline, decreasing in function and po intake.  5. Follow up Palliative Care Visit: Palliative care will continue to follow for goals of care clarification and symptom management. Return 3 weeks or prn.  I spent 60 minutes providing this consultation,  from 1400 to 1500. More than 50% of the time in this consultation was spent coordinating communication.   HISTORY OF PRESENT ILLNESS:  MARGURIETE WOOTAN is a 69 y.o. year old female with multiple medical problems including COPD,tobacco abuse, skin breakdown, ESRD, protein calorie malnutrition. Palliative Care was asked to follow this patient by consultation request of Bernerd Limbo, MD to help address advance care planning and goals of care. This is a follow up visit.  CODE STATUS: TBD  PPS: 40%  HOSPICE ELIGIBILITY/DIAGNOSIS: TBD  PAST MEDICAL HISTORY:  Past Medical History:  Diagnosis Date  . AAA (abdominal aortic aneurysm) ([facility]) 5/09   3.7x3.3 by u/s 2009  . Abnormal EKG    deep TW inversions chronic  . Anemia   . CAD (coronary artery disease)    a. CABG 03/2011 LIMA to LAD, SVG to OM, SVG to PDA. b. cath 06/25/2015 DES to SVG to rPDA, patent LIMA to LAD, patent SVG to OM  . carotid stenosis 5/09   S/p L CEA ;  60-79% bilat ICA by preCABG dopplers 7/12  . CHF (congestive heart failure) ([facility])   . Chronic back pain    "all over" (06/24/2015)  . Chronic bronchitis ([facility])    "get it pretty much q yr" (06/24/2015)  . CKD (chronic kidney  disease)   . Complication of anesthesia 1966   "problem w/ether"  . COPD (chronic obstructive pulmonary disease) ([facility])   . Depression   . Dysrhythmia   . GERD (gastroesophageal reflux disease)    With hiatal hernia  . GIB (gastrointestinal bleeding) 9/11   S/p EGD with cautery at [facility]  . Heart murmur   . History of blood transfusion "a few times"   "all related to anemia" (06/24/2015)  . History of hiatal hernia   . Hyperlipidemia   .  Hypertension   . Mitral regurgitation    3+ MR by intraoperative TEE;  s/p MV repair with Dr. Roxy Manns 7/12  . Myocardial infarction [facility]) 2012 "several"  . On home oxygen therapy    "2 liters at night; negative for sleep apnea"  . PAD (peripheral artery disease) (HCC)    Severe; s/p bilateral renal artery stents, moderate in-stent restenosis  . Pancreatitis 05/2018  . Paroxysmal atrial fibrillation (HCC)    coumadin;  echo 9/07: EF 60%, mild LVH;  s/p Cox Maze 7/12 with LAA clipping  . Subclavian artery stenosis, left (HCC)    stented by Dr. Trula Slade on 10/18 to help flow of her LIMA to LAD  . Type II diabetes mellitus ([place])     SOCIAL HX:  Social History   Tobacco Use  . Smoking status: Current Every Day Smoker    Packs/day: 0.75    Years: 50.00    Pack years: 37.50    Types: Cigarettes  . Smokeless tobacco: Never Used  . Tobacco comment: 3/4 pk per day  Substance Use Topics  . Alcohol use: Yes    Alcohol/week: 0.0 standard drinks    ALLERGIES:  Allergies  Allergen Reactions  . Isosorbide Other (See Comments)    Can only tolerate in low doses (unknown reaction) Other reaction(s): Other Unknown "FELT LIKE I WAS GOING TO EXPLODE FROM INSIDE OUT"  . Codeine Itching  . Contrast Media [Iodinated Diagnostic Agents] Itching and Other (See Comments)    Itching of feet  . Ioxaglate Itching and Other (See Comments)    Itching of feet  . Isosorbide Nitrate Other (See Comments)    Unknown   . Metrizamide Itching and Other (See Comments)    Itching of feet  . Oxycodone-Acetaminophen Itching  . Percocet [Oxycodone-Acetaminophen] Itching and Other (See Comments)    Tolerates Hydrocodone     PERTINENT MEDICATIONS:  Outpatient Encounter Medications as of 02/25/2020  Medication Sig  . albuterol (VENTOLIN HFA) 108 (90 Base) MCG/ACT inhaler Inhale 1-2 puffs into the lungs every 6 (six) hours as needed for wheezing or shortness of breath.  . allopurinol (ZYLOPRIM) 100 MG tablet Take  100 mg by mouth at bedtime.   Marland Kitchen atorvastatin (LIPITOR) 40 MG tablet Take 40 mg by mouth at bedtime.   . clopidogrel (PLAVIX) 75 MG tablet Take 75 mg by mouth at bedtime.   . diphenhydrAMINE (BENADRYL) 25 MG tablet Take 50 mg by mouth at bedtime.  . dronabinol (MARINOL) 2.5 MG capsule Take 2.5 mg by mouth 2 (two) times daily before a meal.  . gabapentin (NEURONTIN) 300 MG capsule Take 1 capsule (300 mg total) by mouth at bedtime.  Marland Kitchen HYDROmorphone (DILAUDID) 2 MG tablet Take 2-4 mg by mouth every 4 (four) hours.   . metoprolol tartrate (LOPRESSOR) 25 MG tablet Take 0.5 tablets (12.5 mg total) by mouth 2 (two) times daily. Do not take in the mornings when you have dialysis.  Marland Kitchen nitroGLYCERIN (NITROSTAT) 0.4 MG  SL tablet Place 1 tablet (0.4 mg total) under the tongue every 5 (five) minutes as needed for chest pain.  . OXYGEN Inhale 2 L into the lungs at bedtime.   . Pancrelipase, Lip-Prot-Amyl, (ZENPEP) 20000-63[address] units CPEP Take 1 capsule by mouth 3 (three) times daily.  . pantoprazole (PROTONIX) 40 MG tablet Take 1 tablet (40 mg total) by mouth daily.  . promethazine (PHENERGAN) 12.5 MG tablet Take 12.5 mg by mouth every 4 (four) hours as needed for nausea or vomiting.  . traZODone (DESYREL) 100 MG tablet Take 150 mg by mouth at bedtime.    No facility-administered encounter medications on file as of 02/25/2020.    PHYSICAL EXAM / ROS:   Current and past weights: 85.6, loss of 3 lbs. In 2 weeks. 12/20 Wt was 99 lbs.  General: NAD, frail appearing, cachectic Cardiovascular: no chest pain reported, has NTG, 2+ ankle edema  Pulmonary: no cough, daily smoker, no increased SOB, room air during day, oxygen at hs Abdomen: appetite poor, takes boost breeze, deniesconstipation, continent of bowel GU: denies dysuria, oliguria, continent of urine, HD T, Th, S. MSK:  no joint and ROM abnormalities, ambulatory, fell 2 weeks ago Skin: states skin tears on left arm and right ischium and  sacrum Neurological: Weakness, pain in MSK and pressure injury areas  Jason Coop, NP [facility]  COVID-19 PATIENT SCREENING TOOL  Person answering questions: ____________Self______ _____   1.  Is the patient or any family member in the home showing any signs or symptoms regarding respiratory infection?               Person with Symptom- __________NA_________________  a. Fever                                                                          Yes___ No___          ___________________  b. Shortness of breath                                                    Yes___ No___          ___________________ c. Cough/congestion                                       Yes___  No___         ___________________ d. Body aches/pains                                                         Yes___ No___        ____________________ e. Gastrointestinal symptoms (diarrhea, nausea)           Yes___ No___        ____________________  2. Within the past 14 days, has anyone living in the home had any  contact with someone with or under investigation for COVID-19?    Yes___ No_X_   Person __________________

## 2020-02-29 ENCOUNTER — Ambulatory Visit: Payer: Medicare Other | Admitting: Gastroenterology

## 2020-03-03 NOTE — Telephone Encounter (Signed)
Re-faxed clearance letter to Dr. Coletta Memos office.

## 2020-03-05 NOTE — Telephone Encounter (Signed)
Recd fax back -Per Dr. Coletta Memos okay for pt to hold her Plavix x5 days prior to procedure TBD.

## 2020-03-17 ENCOUNTER — Other Ambulatory Visit: Payer: Medicare Other | Admitting: Primary Care

## 2020-03-17 ENCOUNTER — Other Ambulatory Visit: Payer: Self-pay

## 2020-03-17 DIAGNOSIS — E43 Unspecified severe protein-calorie malnutrition: Secondary | ICD-10-CM

## 2020-03-17 DIAGNOSIS — Z515 Encounter for palliative care: Secondary | ICD-10-CM

## 2020-03-17 NOTE — Progress Notes (Signed)
[facility] Consult Note Telephone: [phone]  Fax: [phone]  PATIENT NAME: [name] [address] [place] [address] [phone] (home)  DOB: 02/19/1951 MRN: [id number]  PRIMARY CARE PROVIDER:    Bernerd Limbo, MD,  [address] [place] [address] [phone]  REFERRING PROVIDER:   Bernerd Limbo, MD [name],  [place] [address] [phone]  RESPONSIBLE PARTY:   Extended Emergency Contact Information Primary Emergency Contact: [facility] Address: [place], [name] [address] [name] of [employer] [place] Phone: [phone] Relation: Daughter Secondary Emergency Contact: [name],Amy Address: [address]          [place], [place] [address] [name] of [place] Mobile Phone: [phone] Relation: Daughter  I met with patient in her home.  ASSESSMENT AND RECOMMENDATIONS:   1. Advance Care Planning/Goals of Care: Goals include to maximize quality of life and symptom management. Our advance care planning conversation included a discussion about:     The value and importance of advance care planning   Experiences with loved ones who have been seriously ill or have died - her husband  Exploration of personal, cultural or spiritual beliefs that might influence medical decisions   Exploration of goals of care in the event of a sudden injury or illness   Identification of a healthcare agent - states her daughters know her wishes  Creation of an  advance directive document. MOST prepare, Full scope of treatment, only wants peg vs NG tube for feeding. VYNCA Upload failed due to poor signal.  We discussed her husband's hospital death. She regrets removing life support. We discussed his disease at the time, heart failure. She states her daughters know she wants to  Live and to do everything. I broached the issue of coming off dialysis  should she so choose. She states she is not at that point yet, but she is losing function and has very poor intake. Will continue to assess.  2. Symptom Management:   Angina: Increasing  After a med changed from carvedilol to metoprolol but endorses more angina.Using NTG, having episodes 3 times weekly. . Prior to the change she did not have angina attacks. Being monitored with  BP by PT, RN and dialysis, sometimes 70-40. This appears to be orthostasis as it goes up when sitting. Hsa also reported some faintness.   Nutrition: Last labs available were 5-6 weeks ago. Prealbumin was low at 15 and intake continues to decline. Albumin 8 weeks ago was 1.9. Patient is not eating well and today reports exacerbation of pancreatic pain, nausea and light colored stools She is taking her enzymes but intake is very poor. She is having skin break down and is cachectic.  Wounds: Has several erupting wounds no doubt exacerbated by very poor nutritional status. These are currently Rx by home health ( advance) RN Luellen Pucker. I gave orders for silicone boarded foam dressings. There need to be applied to all wounds, 2 on R foot from sleeping on her right side only due to pancreatic pain, sacrum and R ischium. The latter 2 are resolving.  3. Family /Caregiver/Community Supports: Has daughter in area who assists. Gets [employer] which are too much volume. Had a ramp recently built by the [facility].  4. Cognitive / Functional decline: A and O x 3. Getting weaker and fall risk. Needs help with adls, iadls. States  she can drive but has not been driving. Endorses increasing weakness.  5. Follow up Palliative Care Visit: Palliative care will continue to follow for goals of care clarification and symptom management. Return 4 weeks or prn.  I spent 60 minutes providing this consultation,  from 1430 to 1530. More than 50% of the time in this consultation was spent coordinating communication.   CHIEF COMPLAINT: NAUSEA,  EARLY SATIETY, POOR INTAKE, multiple areas of skin break down  HISTORY OF PRESENT ILLNESS:  [name] is a 69 y.o. year old female with multiple medical problems including ESrd, PANCREATITIS, EARLY SATIETY, ANOREXIA. Palliative Care was asked to follow this patient by consultation request of Amy Limbo, MD to help address advance care planning and goals of care. This is a follow up visit.  CODE STATUS: FULL CODE  PPS: 40%  HOSPICE ELIGIBILITY/DIAGNOSIS: With commensurate goals of care. ESRD currently  On HD. Full scope of intervention requested.[  PAST MEDICAL HISTORY:  Past Medical History:  Diagnosis Date  . AAA (abdominal aortic aneurysm) ([facility]) 5/09   3.7x3.3 by u/s 2009  . Abnormal EKG    deep TW inversions chronic  . Anemia   . CAD (coronary artery disease)    a. CABG 03/2011 LIMA to LAD, SVG to OM, SVG to PDA. b. cath 06/25/2015 DES to SVG to rPDA, patent LIMA to LAD, patent SVG to OM  . carotid stenosis 5/09   S/p L CEA ;  60-79% bilat ICA by preCABG dopplers 7/12  . CHF (congestive heart failure) ([facility])   . Chronic back pain    "all over" (06/24/2015)  . Chronic bronchitis ([facility])    "get it pretty much q yr" (06/24/2015)  . CKD (chronic kidney disease)   . Complication of anesthesia 1966   "problem w/ether"  . COPD (chronic obstructive pulmonary disease) ([place])   . Depression   . Dysrhythmia   . GERD (gastroesophageal reflux disease)    With hiatal hernia  . GIB (gastrointestinal bleeding) 9/11   S/p EGD with cautery at [facility]  . Heart murmur   . History of blood transfusion "a few times"   "all related to anemia" (06/24/2015)  . History of hiatal hernia   . Hyperlipidemia   . Hypertension   . Mitral regurgitation    3+ MR by intraoperative TEE;  s/p MV repair with Dr. Roxy Manns 7/12  . Myocardial infarction [facility]) 2012 "several"  . On home oxygen therapy    "2 liters at night; negative for sleep apnea"  . PAD (peripheral artery disease) (HCC)    Severe; s/p  bilateral renal artery stents, moderate in-stent restenosis  . Pancreatitis 05/2018  . Paroxysmal atrial fibrillation (HCC)    coumadin;  echo 9/07: EF 60%, mild LVH;  s/p Cox Maze 7/12 with LAA clipping  . Subclavian artery stenosis, left (HCC)    stented by Dr. Trula Slade on 10/18 to help flow of her LIMA to LAD  . Type II diabetes mellitus ([place])     SOCIAL HX:  Social History   Tobacco Use  . Smoking status: Current Every Day Smoker    Packs/day: 0.75    Years: 50.00    Pack years: 37.50    Types: Cigarettes  . Smokeless tobacco: Never Used  . Tobacco comment: 3/4 pk per day  Substance Use Topics  . Alcohol use: Yes    Alcohol/week: 0.0 standard drinks    ALLERGIES:  Allergies  Allergen Reactions  . Isosorbide Other (See Comments)  Can only tolerate in low doses (unknown reaction) Other reaction(s): Other Unknown "FELT LIKE I WAS GOING TO EXPLODE FROM INSIDE OUT"  . Codeine Itching  . Contrast Media [Iodinated Diagnostic Agents] Itching and Other (See Comments)    Itching of feet  . Ioxaglate Itching and Other (See Comments)    Itching of feet  . Isosorbide Nitrate Other (See Comments)    Unknown   . Metrizamide Itching and Other (See Comments)    Itching of feet  . Oxycodone-Acetaminophen Itching  . Percocet [Oxycodone-Acetaminophen] Itching and Other (See Comments)    Tolerates Hydrocodone     PERTINENT MEDICATIONS:  Outpatient Encounter Medications as of 03/17/2020  Medication Sig  . albuterol (VENTOLIN HFA) 108 (90 Base) MCG/ACT inhaler Inhale 1-2 puffs into the lungs every 6 (six) hours as needed for wheezing or shortness of breath.  . allopurinol (ZYLOPRIM) 100 MG tablet Take 100 mg by mouth at bedtime.   Marland Kitchen atorvastatin (LIPITOR) 40 MG tablet Take 40 mg by mouth at bedtime.   . clopidogrel (PLAVIX) 75 MG tablet Take 75 mg by mouth at bedtime.   . diphenhydrAMINE (BENADRYL) 25 MG tablet Take 50 mg by mouth at bedtime.  . dronabinol (MARINOL) 2.5 MG capsule  Take 2.5 mg by mouth 2 (two) times daily before a meal.  . gabapentin (NEURONTIN) 300 MG capsule Take 1 capsule (300 mg total) by mouth at bedtime.  Marland Kitchen HYDROmorphone (DILAUDID) 2 MG tablet Take 2-4 mg by mouth every 4 (four) hours.   . metoprolol tartrate (LOPRESSOR) 25 MG tablet Take 0.5 tablets (12.5 mg total) by mouth 2 (two) times daily. Do not take in the mornings when you have dialysis.  Marland Kitchen nitroGLYCERIN (NITROSTAT) 0.4 MG SL tablet Place 1 tablet (0.4 mg total) under the tongue every 5 (five) minutes as needed for chest pain.  . OXYGEN Inhale 2 L into the lungs at bedtime.   . Pancrelipase, Lip-Prot-Amyl, (ZENPEP) 20000-63000 units CPEP Take 1 capsule by mouth 3 (three) times daily.  . pantoprazole (PROTONIX) 40 MG tablet Take 1 tablet (40 mg total) by mouth daily.  . promethazine (PHENERGAN) 12.5 MG tablet Take 12.5 mg by mouth every 4 (four) hours as needed for nausea or vomiting.  . traZODone (DESYREL) 100 MG tablet Take 150 mg by mouth at bedtime.    No facility-administered encounter medications on file as of 03/17/2020.    PHYSICAL EXAM / ROS:   Current and past weights: this past week = 39.4 kg, 86.5 lbs, 65 " General: NAD, frail appearing, cachectic,  Cardiovascular: no chest pain reported, 1+  edema  , endorses lower BP after HD, Pulmonary: no cough, no increased SOB at rest, DOE, room air Abdomen: appetite fair to poor, has had pancreatitis attack. Early satiety and nausea.  Denies constipation, endorses diarrhea, light brown - yellow, incontinent of bowel, uses pullups, has zimpep.  GU: denies dysuria, continent of urine , decreasing urine output, urinates a few times a day. On ESRD HD MSK:  Z== joint and ROM abnormalities, ambulatory with walker, endorses great MSK weakness.  Skin: sacral ulcer is healing with foam dressing, R ischium also healing with foam dressing.  2 ulcers on outer aspect of right foot due to pressure of sleeping on 1 side. Dressed today and home health  orders given to [place] for wound care pending agreement by PCP. Neurological: Weakness,  Endorses pain and discomfort, poor sleep  Jason Coop, NP [facility]  COVID-19 PATIENT SCREENING TOOL  Person  answering questions: ___________Self______ _____   1.  Is the patient or any family member in the home showing any signs or symptoms regarding respiratory infection?               Person with Symptom- __________NA_________________  a. Fever                                                                          Yes___ No___          ___________________  b. Shortness of breath                                                    Yes___ No___          ___________________ c. Cough/congestion                                       Yes___  No___         ___________________ d. Body aches/pains                                                         Yes___ No___        ____________________ e. Gastrointestinal symptoms (diarrhea, nausea)           Yes___ No___        ____________________  2. Within the past 14 days, has anyone living in the home had any contact with someone with or under investigation for COVID-19?    Yes___ No_X_   Person __________________

## 2020-03-19 ENCOUNTER — Other Ambulatory Visit: Payer: Medicare Other | Admitting: Primary Care

## 2020-03-27 ENCOUNTER — Telehealth: Payer: Self-pay | Admitting: Gastroenterology

## 2020-03-27 NOTE — Telephone Encounter (Signed)
Can you please send to [place] ? She was seen in the office and Ro started the process

## 2020-03-27 NOTE — Telephone Encounter (Signed)
Spoke with a rep from [employer] program. They are still waiting for the patient portion of the [employer] form to see if pt will qualify for pt assistance. MD portion was faxed and recd per Rep. I called and spoke with pt this afternoon and informed her of this. Pt states that she will have her daug to fax all needed information to [employer] on tomorrow 03/28/20. Pt asked for samples of Creon as she has not be able to get Zenpep. She also stated that Zenpep caused increase gas and belching. Zenpep will be d/c. Samples of Creon (7boxes) left at front desk for pt's daug to pick up. Pt will let me know if she needs further assistance. I will follow-up with [employer] next week to check status of pt asst application. I was told that once pt returns her portion it should only take about 72 hrs for a decision to made. Pt was also given this information.

## 2020-03-27 NOTE — Telephone Encounter (Signed)
Patient calling to follow up on the Creon medication process please advise

## 2020-03-27 NOTE — Telephone Encounter (Signed)
Spoke with pt today and she would like to wait a few more weeks to scheduled colonoscopy.

## 2020-04-15 NOTE — Telephone Encounter (Signed)
Pt called to informed us that she has been approved for patient asst for her Creon.

## 2020-04-18 ENCOUNTER — Telehealth: Payer: Self-pay | Admitting: Gastroenterology

## 2020-04-18 NOTE — Telephone Encounter (Signed)
Cornelius Moras.. If you do not get pt please call daughter Kirke Shaggy @ [phone]

## 2020-04-18 NOTE — Telephone Encounter (Signed)
I spoke with the pt's daughter and she wants to make Dr Rush Landmark aware that the pt is seeing PCP for referral of feeding tube.

## 2020-04-19 ENCOUNTER — Emergency Department: Payer: Medicare Other

## 2020-04-19 ENCOUNTER — Encounter: Payer: Self-pay | Admitting: Emergency Medicine

## 2020-04-19 ENCOUNTER — Inpatient Hospital Stay
Admission: EM | Admit: 2020-04-19 | Discharge: 2020-05-05 | DRG: 592 | Disposition: A | Discharge status: Hospice | Payer: Medicare Other | Attending: Internal Medicine | Admitting: Internal Medicine

## 2020-04-19 ENCOUNTER — Other Ambulatory Visit: Payer: Self-pay

## 2020-04-19 DIAGNOSIS — E785 Hyperlipidemia, unspecified: Secondary | ICD-10-CM | POA: Diagnosis present

## 2020-04-19 DIAGNOSIS — L8994 Pressure ulcer of unspecified site, stage 4: Secondary | ICD-10-CM

## 2020-04-19 DIAGNOSIS — Z83438 Family history of other disorder of lipoprotein metabolism and other lipidemia: Secondary | ICD-10-CM

## 2020-04-19 DIAGNOSIS — E1122 Type 2 diabetes mellitus with diabetic chronic kidney disease: Secondary | ICD-10-CM | POA: Diagnosis present

## 2020-04-19 DIAGNOSIS — R011 Cardiac murmur, unspecified: Secondary | ICD-10-CM | POA: Diagnosis present

## 2020-04-19 DIAGNOSIS — E1151 Type 2 diabetes mellitus with diabetic peripheral angiopathy without gangrene: Secondary | ICD-10-CM | POA: Diagnosis present

## 2020-04-19 DIAGNOSIS — Z833 Family history of diabetes mellitus: Secondary | ICD-10-CM

## 2020-04-19 DIAGNOSIS — Z934 Other artificial openings of gastrointestinal tract status: Secondary | ICD-10-CM

## 2020-04-19 DIAGNOSIS — J449 Chronic obstructive pulmonary disease, unspecified: Secondary | ICD-10-CM | POA: Diagnosis present

## 2020-04-19 DIAGNOSIS — I2510 Atherosclerotic heart disease of native coronary artery without angina pectoris: Secondary | ICD-10-CM | POA: Diagnosis present

## 2020-04-19 DIAGNOSIS — E43 Unspecified severe protein-calorie malnutrition: Secondary | ICD-10-CM

## 2020-04-19 DIAGNOSIS — Z20822 Contact with and (suspected) exposure to covid-19: Secondary | ICD-10-CM | POA: Diagnosis present

## 2020-04-19 DIAGNOSIS — F17210 Nicotine dependence, cigarettes, uncomplicated: Secondary | ICD-10-CM | POA: Diagnosis present

## 2020-04-19 DIAGNOSIS — Z9049 Acquired absence of other specified parts of digestive tract: Secondary | ICD-10-CM

## 2020-04-19 DIAGNOSIS — L89314 Pressure ulcer of right buttock, stage 4: Secondary | ICD-10-CM | POA: Diagnosis not present

## 2020-04-19 DIAGNOSIS — I5032 Chronic diastolic (congestive) heart failure: Secondary | ICD-10-CM | POA: Diagnosis present

## 2020-04-19 DIAGNOSIS — K253 Acute gastric ulcer without hemorrhage or perforation: Secondary | ICD-10-CM

## 2020-04-19 DIAGNOSIS — R531 Weakness: Secondary | ICD-10-CM

## 2020-04-19 DIAGNOSIS — K2210 Ulcer of esophagus without bleeding: Secondary | ICD-10-CM | POA: Diagnosis present

## 2020-04-19 DIAGNOSIS — Z90710 Acquired absence of both cervix and uterus: Secondary | ICD-10-CM

## 2020-04-19 DIAGNOSIS — Z955 Presence of coronary angioplasty implant and graft: Secondary | ICD-10-CM

## 2020-04-19 DIAGNOSIS — Z825 Family history of asthma and other chronic lower respiratory diseases: Secondary | ICD-10-CM

## 2020-04-19 DIAGNOSIS — I9589 Other hypotension: Secondary | ICD-10-CM | POA: Diagnosis present

## 2020-04-19 DIAGNOSIS — N3001 Acute cystitis with hematuria: Secondary | ICD-10-CM | POA: Diagnosis present

## 2020-04-19 DIAGNOSIS — M899 Disorder of bone, unspecified: Secondary | ICD-10-CM | POA: Diagnosis present

## 2020-04-19 DIAGNOSIS — Z8249 Family history of ischemic heart disease and other diseases of the circulatory system: Secondary | ICD-10-CM

## 2020-04-19 DIAGNOSIS — S31819A Unspecified open wound of right buttock, initial encounter: Secondary | ICD-10-CM

## 2020-04-19 DIAGNOSIS — Z91041 Radiographic dye allergy status: Secondary | ICD-10-CM

## 2020-04-19 DIAGNOSIS — N184 Chronic kidney disease, stage 4 (severe): Secondary | ICD-10-CM | POA: Diagnosis present

## 2020-04-19 DIAGNOSIS — I4821 Permanent atrial fibrillation: Secondary | ICD-10-CM | POA: Diagnosis present

## 2020-04-19 DIAGNOSIS — Z888 Allergy status to other drugs, medicaments and biological substances status: Secondary | ICD-10-CM

## 2020-04-19 DIAGNOSIS — R1314 Dysphagia, pharyngoesophageal phase: Secondary | ICD-10-CM | POA: Diagnosis present

## 2020-04-19 DIAGNOSIS — D631 Anemia in chronic kidney disease: Secondary | ICD-10-CM | POA: Diagnosis present

## 2020-04-19 DIAGNOSIS — L89512 Pressure ulcer of right ankle, stage 2: Secondary | ICD-10-CM | POA: Diagnosis present

## 2020-04-19 DIAGNOSIS — Z4659 Encounter for fitting and adjustment of other gastrointestinal appliance and device: Secondary | ICD-10-CM

## 2020-04-19 DIAGNOSIS — N186 End stage renal disease: Secondary | ICD-10-CM | POA: Diagnosis present

## 2020-04-19 DIAGNOSIS — J9601 Acute respiratory failure with hypoxia: Secondary | ICD-10-CM | POA: Diagnosis not present

## 2020-04-19 DIAGNOSIS — K648 Other hemorrhoids: Secondary | ICD-10-CM | POA: Diagnosis present

## 2020-04-19 DIAGNOSIS — J4489 Other specified chronic obstructive pulmonary disease: Secondary | ICD-10-CM | POA: Diagnosis present

## 2020-04-19 DIAGNOSIS — N2581 Secondary hyperparathyroidism of renal origin: Secondary | ICD-10-CM | POA: Diagnosis present

## 2020-04-19 DIAGNOSIS — Z515 Encounter for palliative care: Secondary | ICD-10-CM | POA: Diagnosis not present

## 2020-04-19 DIAGNOSIS — R634 Abnormal weight loss: Secondary | ICD-10-CM

## 2020-04-19 DIAGNOSIS — F0390 Unspecified dementia without behavioral disturbance: Secondary | ICD-10-CM | POA: Diagnosis present

## 2020-04-19 DIAGNOSIS — R109 Unspecified abdominal pain: Secondary | ICD-10-CM

## 2020-04-19 DIAGNOSIS — E119 Type 2 diabetes mellitus without complications: Secondary | ICD-10-CM

## 2020-04-19 DIAGNOSIS — I4891 Unspecified atrial fibrillation: Secondary | ICD-10-CM | POA: Diagnosis present

## 2020-04-19 DIAGNOSIS — K259 Gastric ulcer, unspecified as acute or chronic, without hemorrhage or perforation: Secondary | ICD-10-CM | POA: Diagnosis present

## 2020-04-19 DIAGNOSIS — R627 Adult failure to thrive: Secondary | ICD-10-CM

## 2020-04-19 DIAGNOSIS — F329 Major depressive disorder, single episode, unspecified: Secondary | ICD-10-CM | POA: Diagnosis present

## 2020-04-19 DIAGNOSIS — L89151 Pressure ulcer of sacral region, stage 1: Secondary | ICD-10-CM

## 2020-04-19 DIAGNOSIS — E871 Hypo-osmolality and hyponatremia: Secondary | ICD-10-CM | POA: Diagnosis present

## 2020-04-19 DIAGNOSIS — I5033 Acute on chronic diastolic (congestive) heart failure: Secondary | ICD-10-CM | POA: Diagnosis present

## 2020-04-19 DIAGNOSIS — Z992 Dependence on renal dialysis: Secondary | ICD-10-CM

## 2020-04-19 DIAGNOSIS — Z7901 Long term (current) use of anticoagulants: Secondary | ICD-10-CM

## 2020-04-19 DIAGNOSIS — I252 Old myocardial infarction: Secondary | ICD-10-CM

## 2020-04-19 DIAGNOSIS — D175 Benign lipomatous neoplasm of intra-abdominal organs: Secondary | ICD-10-CM

## 2020-04-19 DIAGNOSIS — Z79891 Long term (current) use of opiate analgesic: Secondary | ICD-10-CM

## 2020-04-19 DIAGNOSIS — E11649 Type 2 diabetes mellitus with hypoglycemia without coma: Secondary | ICD-10-CM | POA: Diagnosis present

## 2020-04-19 DIAGNOSIS — Z7902 Long term (current) use of antithrombotics/antiplatelets: Secondary | ICD-10-CM

## 2020-04-19 DIAGNOSIS — Z681 Body mass index (BMI) 19 or less, adult: Secondary | ICD-10-CM

## 2020-04-19 DIAGNOSIS — R1319 Other dysphagia: Secondary | ICD-10-CM

## 2020-04-19 DIAGNOSIS — K209 Esophagitis, unspecified without bleeding: Secondary | ICD-10-CM

## 2020-04-19 DIAGNOSIS — K219 Gastro-esophageal reflux disease without esophagitis: Secondary | ICD-10-CM | POA: Diagnosis present

## 2020-04-19 DIAGNOSIS — Z978 Presence of other specified devices: Secondary | ICD-10-CM

## 2020-04-19 DIAGNOSIS — Z79899 Other long term (current) drug therapy: Secondary | ICD-10-CM

## 2020-04-19 DIAGNOSIS — E876 Hypokalemia: Secondary | ICD-10-CM | POA: Diagnosis present

## 2020-04-19 DIAGNOSIS — N390 Urinary tract infection, site not specified: Secondary | ICD-10-CM

## 2020-04-19 DIAGNOSIS — I132 Hypertensive heart and chronic kidney disease with heart failure and with stage 5 chronic kidney disease, or end stage renal disease: Secondary | ICD-10-CM | POA: Diagnosis present

## 2020-04-19 DIAGNOSIS — K635 Polyp of colon: Secondary | ICD-10-CM

## 2020-04-19 DIAGNOSIS — I701 Atherosclerosis of renal artery: Secondary | ICD-10-CM | POA: Diagnosis present

## 2020-04-19 DIAGNOSIS — R638 Other symptoms and signs concerning food and fluid intake: Secondary | ICD-10-CM | POA: Diagnosis present

## 2020-04-19 DIAGNOSIS — Z66 Do not resuscitate: Secondary | ICD-10-CM | POA: Diagnosis not present

## 2020-04-19 DIAGNOSIS — I10 Essential (primary) hypertension: Secondary | ICD-10-CM | POA: Diagnosis present

## 2020-04-19 DIAGNOSIS — L089 Local infection of the skin and subcutaneous tissue, unspecified: Secondary | ICD-10-CM | POA: Diagnosis present

## 2020-04-19 DIAGNOSIS — R64 Cachexia: Secondary | ICD-10-CM | POA: Diagnosis present

## 2020-04-19 DIAGNOSIS — Z951 Presence of aortocoronary bypass graft: Secondary | ICD-10-CM

## 2020-04-19 DIAGNOSIS — Z885 Allergy status to narcotic agent status: Secondary | ICD-10-CM

## 2020-04-19 DIAGNOSIS — Z9981 Dependence on supplemental oxygen: Secondary | ICD-10-CM

## 2020-04-19 DIAGNOSIS — G8929 Other chronic pain: Secondary | ICD-10-CM | POA: Diagnosis present

## 2020-04-19 LAB — CBC
HCT: 31.4 % — ABNORMAL LOW (ref 36.0–46.0)
Hemoglobin: 10.7 g/dL — ABNORMAL LOW (ref 12.0–15.0)
MCH: 35 pg — ABNORMAL HIGH (ref 26.0–34.0)
MCHC: 34.1 g/dL (ref 30.0–36.0)
MCV: 102.6 fL — ABNORMAL HIGH (ref 80.0–100.0)
Platelets: 152 10*3/uL (ref 150–400)
RBC: 3.06 MIL/uL — ABNORMAL LOW (ref 3.87–5.11)
RDW: 14.6 % (ref 11.5–15.5)
WBC: 9.5 10*3/uL (ref 4.0–10.5)
nRBC: 0 % (ref 0.0–0.2)

## 2020-04-19 LAB — BASIC METABOLIC PANEL
Anion gap: 10 (ref 5–15)
BUN: 27 mg/dL — ABNORMAL HIGH (ref 8–23)
CO2: 26 mmol/L (ref 22–32)
Calcium: 8 mg/dL — ABNORMAL LOW (ref 8.9–10.3)
Chloride: 99 mmol/L (ref 98–111)
Creatinine, Ser: 2.9 mg/dL — ABNORMAL HIGH (ref 0.44–1.00)
GFR calc Af Amer: 19 mL/min — ABNORMAL LOW (ref >60)
GFR calc non Af Amer: 16 mL/min — ABNORMAL LOW (ref >60)
Glucose, Bld: 96 mg/dL (ref 70–99)
Potassium: 4.1 mmol/L (ref 3.5–5.1)
Sodium: 135 mmol/L (ref 135–145)

## 2020-04-19 LAB — TROPONIN I (HIGH SENSITIVITY): Troponin I (High Sensitivity): 45 ng/L — ABNORMAL HIGH (ref <18)

## 2020-04-19 MED ORDER — FENTANYL CITRATE (PF) 100 MCG/2ML IJ SOLN
100.0000 ug | Freq: Once | INTRAMUSCULAR | Status: AC
  Administered 2020-04-19: 100 ug via INTRAVENOUS
  Filled 2020-04-19: qty 2

## 2020-04-19 MED ORDER — SODIUM CHLORIDE 0.9 % IV BOLUS
500.0000 mL | Freq: Once | INTRAVENOUS | Status: AC
  Administered 2020-04-19: 500 mL via INTRAVENOUS

## 2020-04-19 NOTE — ED Triage Notes (Addendum)
Pt arrived via [facility] with reports of weakness, unable to hold up her head and pain to tailbone related to pressure ulcer that formed starting on Monday, pt sitting in wheelchair at this time, but leaning to side.  Pt reports she has a caregiver that helps her.   Pt states she takes Dilaudid 4mg  at home, states last dose was around 830-9am.

## 2020-04-19 NOTE — ED Notes (Signed)
Repeat VS obtained by this RN at this time. This RN apologized for delay in patient going to a room at this time.

## 2020-04-19 NOTE — ED Notes (Signed)
Round ulcer noted on left side of sacrum, black eschar in center.

## 2020-04-19 NOTE — ED Provider Notes (Signed)
[facility] Emergency Department Provider Note  ____________________________________________   I have reviewed the triage vital signs and the nursing notes.   HISTORY  Chief Complaint Weakness and Pressure Ulcer   History limited by: Not Limited   HPI [name] is a 69 y.o. female who presents to the emergency department today because of concern for weakness and wound to right buttock. First noticed wound tor right buttock 5 days ago. It started hurting a few days later. The pain is severe and not controlled with her chronic dilaudid medication. She has also felt increasing weakness since yesterday. Noticed she has had some difficulty getting up. The patient has complaint of chronic pancreatitis as well. The patient denies any fevers. Denies any vomiting or diarrhea although has had decreased oral intake.    Records reviewed. Per medical record review patient has a history of CAD, ESRD on dialysis.   Past Medical History:  Diagnosis Date  . AAA (abdominal aortic aneurysm) ([place]) 5/09   3.7x3.3 by u/s 2009  . Abnormal EKG    deep TW inversions chronic  . Anemia   . CAD (coronary artery disease)    a. CABG 03/2011 LIMA to LAD, SVG to OM, SVG to PDA. b. cath 06/25/2015 DES to SVG to rPDA, patent LIMA to LAD, patent SVG to OM  . carotid stenosis 5/09   S/p L CEA ;  60-79% bilat ICA by preCABG dopplers 7/12  . CHF (congestive heart failure) ([place])   . Chronic back pain    "all over" (06/24/2015)  . Chronic bronchitis ([place])    "get it pretty much q yr" (06/24/2015)  . CKD (chronic kidney disease)   . Complication of anesthesia 1966   "problem w/ether"  . COPD (chronic obstructive pulmonary disease) ([place])   . Depression   . Dysrhythmia   . GERD (gastroesophageal reflux disease)    With hiatal hernia  . GIB (gastrointestinal bleeding) 9/11   S/p EGD with cautery at [facility]  . Heart murmur   . History of blood transfusion "a few times"   "all related to  anemia" (06/24/2015)  . History of hiatal hernia   . Hyperlipidemia   . Hypertension   . Mitral regurgitation    3+ MR by intraoperative TEE;  s/p MV repair with Dr. Roxy Manns 7/12  . Myocardial infarction [facility]) 2012 "several"  . On home oxygen therapy    "2 liters at night; negative for sleep apnea"  . PAD (peripheral artery disease) (HCC)    Severe; s/p bilateral renal artery stents, moderate in-stent restenosis  . Pancreatitis 05/2018  . Paroxysmal atrial fibrillation (HCC)    coumadin;  echo 9/07: EF 60%, mild LVH;  s/p Cox Maze 7/12 with LAA clipping  . Subclavian artery stenosis, left (HCC)    stented by Dr. Trula Slade on 10/18 to help flow of her LIMA to LAD  . Type II diabetes mellitus [facility])     Patient Active Problem List   Diagnosis Date Noted  . Unintentional weight loss 02/06/2020  . Generalized abdominal pain 02/06/2020  . Idiopathic chronic pancreatitis ([place]) 02/06/2020  . AV fistula occlusion ([place]) 01/22/2020  . AV fistula thrombosis, initial encounter ([place]) 01/19/2020  . AF (paroxysmal atrial fibrillation) ([place]) 01/19/2020  . Chronic respiratory failure with hypoxia ([place]) 01/19/2020  . Acute pancreatitis 07/26/2019  . Acute on chronic pancreatitis ([place]) 05/12/2019  . Abnormal CT of the abdomen 09/25/2018  . Pancreatic divisum 09/25/2018  . Pancreatic mass   .  ESRD on hemodialysis ([place]) 09/09/2018  . Renal failure (ARF), acute on chronic (HCC) 08/15/2018  . ARF (acute renal failure) ([place]) 08/15/2018  . CKD (chronic kidney disease), stage V ([place]) 08/15/2018  . Hypomagnesemia 08/15/2018  . Acute pulmonary edema ([place]) 08/14/2018  . Goals of care, counseling/discussion   . DNR (do not resuscitate) discussion   . AKI (acute kidney injury) ([place]) 07/26/2018  . Acute encephalopathy 07/24/2018  . Chronic pain 07/24/2018  . Malnutrition of moderate degree 07/13/2018  . Stage II pressure ulcer ([place]) 07/12/2018  . Fluid collection at surgical site   . Intraabdominal fluid  collection   . Cystic duct calculus   . RUQ pain 07/08/2018  . Peripancreatic fluid collection 07/04/2018  . 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 Urinary retention 07/04/2018  . S/P insertion of iliac artery stent 07/04/2018  . Endoleak post (EVAR) endovascular aneurysm repair ([place]) 07/04/2018  . Physical deconditioning   . Esophageal ulcer with bleeding   . Pressure injury of skin 06/16/2018  . Hematemesis   . Palliative care patient   . Palliative care by specialist   . Anasarca 06/07/2018  . Demand ischemia ([place]) 06/07/2018  . Protein-calorie malnutrition, severe 05/29/2018  . Type II diabetes mellitus with renal manifestations ([place]) 05/28/2018  . Recurrent pancreatitis 05/28/2018  . Hypokalemia 05/28/2018  . SIRS (systemic inflammatory response syndrome) ([place]) 05/28/2018  . Acute recurrent pancreatitis 05/28/2018  . Volume overload 04/17/2018  . Anemia due to GI blood loss 04/14/2018  . Acute gallstone pancreatitis 04/14/2018  . Acute cholecystitis 04/14/2018  . Gastrointestinal hemorrhage   . Fatigue associated with anemia 04/09/2018  . Hypoglycemia 01/19/2018  . Type II diabetes mellitus ([place]) 01/19/2018  . Nausea with vomiting 01/19/2018  . Uncontrolled hypertension 01/19/2018  . Hyperlipidemia 01/19/2018  . CAD (coronary artery disease) 01/19/2018  . Chronic kidney disease (CKD), stage IV (severe) ([place]) 01/19/2018  . Retroperitoneal hematoma 01/19/2018  . Acute blood loss anemia 01/19/2018  . Chronic diastolic congestive heart failure ([place]) 01/19/2018  . AAA (abdominal aortic aneurysm) ([place]) 01/16/2018  . Elevated brain natriuretic peptide (BNP) level 10/07/2015  . Diabetes mellitus ([place]) 10/07/2015  . Pain in the chest   . PAD (peripheral artery disease) ([place]) 07/29/2015  . Chest pain 07/04/2015  . Acute renal failure superimposed on stage 4 chronic kidney disease ([place]) 07/04/2015  . GERD (gastroesophageal reflux disease) 07/04/2015  . Anemia 06/26/2015  .  Coronary artery disease involving autologous vein coronary bypass graft with unstable angina pectoris ([place]) 06/24/2015  . Angina at rest [facility])   . Subclavian artery stenosis, left ([place]) 06/23/2015  . AAA (abdominal aortic aneurysm) without rupture ([place]) 12/10/2014  . Subclavian steal syndrome 12/10/2014  . Renal artery stenosis ([place]) 09/04/2013  . Carotid artery stenosis 09/28/2012  . Mitral valve disorder 06/23/2011  . S/P CABG x 3 06/16/2011  . S/P Maze operation for atrial fibrillation 06/16/2011  . S/P MVR (mitral valve repair) 06/16/2011  . Follow-up examination following surgery 03/29/2011  . CKD (chronic kidney disease) 03/03/2011  . COPD with chronic bronchitis ([place]) 03/03/2011  . Chronic diastolic heart failure ([place]) 02/26/2011  . Hypotension 02/26/2011  . NSTEMI (non-ST elevated myocardial infarction) ([place]) 02/26/2011  . Long term (current) use of anticoagulants 12/17/2010  . OBSTRUCTIVE SLEEP APNEA 09/04/2010  . HYPOXEMIA 09/04/2010  . SNORING 08/05/2010  . Atrial fibrillation ([place]) 12/09/2009  . WEIGHT LOSS 03/28/2009  . PALPITATIONS 03/28/2009  . PVD 11/21/2008  . HYPERLIPIDEMIA-MIXED 11/20/2008  . TOBACCO ABUSE 11/20/2008  . Essential  hypertension 11/20/2008  . Coronary artery disease involving native heart with angina pectoris ([place]) 11/20/2008  . ABDOMINAL AORTIC ANEURYSM 11/20/2008    Past Surgical History:  Procedure Laterality Date  . A/V FISTULAGRAM Left 03/21/2018   Procedure: A/V FISTULAGRAM;  Surgeon: Serafina Mitchell, MD;  Location: [place] [facility];  Service: Cardiovascular;  Laterality: Left;  . ABDOMINAL AORTIC ENDOVASCULAR STENT GRAFT N/A 01/17/2018   Procedure: ABDOMINAL AORTIC ENDOVASCULAR STENT GRAFT WITH CO2;  Surgeon: Serafina Mitchell, MD;  Location: [place];  Service: Vascular;  Laterality: N/A;  . ABDOMINAL HYSTERECTOMY  1990's  . APPENDECTOMY  Aug. 11, 2016   Ruptured  . AV FISTULA PLACEMENT Left 11/03/2017   Procedure: ARTERIOVENOUS (AV)  FISTULA CREATION LEFT ARM;  Surgeon: Serafina Mitchell, MD;  Location: [place];  Service: Vascular;  Laterality: Left;  . BOWEL RESECTION  2013  . CARDIAC CATHETERIZATION N/A 06/24/2015   Procedure: Left Heart Cath and Cors/Grafts Angiography;  Surgeon: Leonie Man, MD;  Location: [place] [facility];  Service: Cardiovascular;  Laterality: N/A;  . CARDIAC CATHETERIZATION N/A 06/25/2015   Procedure: Coronary Stent Intervention;  Surgeon: Sherren Mocha, MD;  Location: [place] [facility];  Service: Cardiovascular;  Laterality: N/A;  . CAROTID ENDARTERECTOMY Left   . CATARACT EXTRACTION Right 03/27/2018  . CHOLECYSTECTOMY N/A 04/19/2018   Procedure: LAPAROSCOPIC CHOLECYSTECTOMY WITH INTRAOPERATIVE CHOLANGIOGRAM;  Surgeon: Rolm Bookbinder, MD;  Location: [place];  Service: General;  Laterality: N/A;  . CORONARY ANGIOPLASTY    . CORONARY ARTERY BYPASS GRAFT  03/05/2011   CABG X3 (LIMA to LAD, SVG to OM, SVG to PDA, EVH via left thigh  . CORONARY STENT PLACEMENT    . ESOPHAGOGASTRODUODENOSCOPY N/A 04/12/2018   Procedure: ESOPHAGOGASTRODUODENOSCOPY (EGD);  Surgeon: [name], MD;  Location: [facility];  Service: Endoscopy;  Laterality: N/A;  . ESOPHAGOGASTRODUODENOSCOPY N/A 06/16/2018   Procedure: ESOPHAGOGASTRODUODENOSCOPY (EGD);  Surgeon: Thornton Park, MD;  Location: [place];  Service: Gastroenterology;  Laterality: N/A;  . IR AV DIALY SHUNT INTRO NEEDLE/INTRACATH INITIAL W/PTA/IMG LEFT  01/23/2020  . IR RADIOLOGIST EVAL & MGMT  09/13/2019  . IR RADIOLOGIST EVAL & MGMT  10/31/2019  . IR RADIOLOGIST EVAL & MGMT  11/14/2019  . IR RADIOLOGIST EVAL & MGMT  01/02/2020  . IR US GUIDE VASC ACCESS LEFT  01/23/2020  . LACERATION REPAIR Right 1990's   WRIST  . MAZE  03/05/2011   complete biatrial lesion set with clipping of LA appendage  . MITRAL VALVE REPAIR  03/05/2011   101mm Memo 3D ring annuloplasty for ischemic MR  . PERIPHERAL VASCULAR BALLOON ANGIOPLASTY Left 03/21/2018   Procedure:  PERIPHERAL VASCULAR BALLOON ANGIOPLASTY;  Surgeon: Serafina Mitchell, MD;  Location: [place] [facility];  Service: Cardiovascular;  Laterality: Left;  . PERIPHERAL VASCULAR CATHETERIZATION N/A 06/24/2015   Procedure: Aortic Arch Angiography;  Surgeon: Serafina Mitchell, MD;  Location: [place] [facility];  Service: Cardiovascular;  Laterality: N/A;  . PERIPHERAL VASCULAR CATHETERIZATION  06/24/2015   Procedure: Peripheral Vascular Intervention;  Surgeon: Serafina Mitchell, MD;  Location: [place] [facility];  Service: Cardiovascular;;  . PERIPHERAL VASCULAR CATHETERIZATION N/A 06/24/2015   Procedure: Abdominal Aortogram;  Surgeon: Serafina Mitchell, MD;  Location: [place] [facility];  Service: Cardiovascular;  Laterality: N/A;  . RENAL ANGIOGRAM Bilateral 09/11/2013   Procedure: RENAL ANGIOGRAM;  Surgeon: Serafina Mitchell, MD;  Location: [facility] C[facility];  Service: Cardiovascular;  Laterality: Bilateral;  . RIGHT FEMORAL-POPLITEAL BYPASS  Prior to Admission medications   Medication Sig Start Date End Date Taking? Authorizing Provider  albuterol (VENTOLIN HFA) 108 (90 Base) MCG/ACT inhaler Inhale 1-2 puffs into the lungs every 6 (six) hours as needed for wheezing or shortness of breath.    [provider]  allopurinol (ZYLOPRIM) 100 MG tablet Take 100 mg by mouth at bedtime.  02/02/17   [provider]  atorvastatin (LIPITOR) 40 MG tablet Take 40 mg by mouth at bedtime.  12/07/17   [provider]  clopidogrel (PLAVIX) 75 MG tablet Take 75 mg by mouth at bedtime.  10/02/19   [provider]  diphenhydrAMINE (BENADRYL) 25 MG tablet Take 50 mg by mouth at bedtime.    [provider]  dronabinol (MARINOL) 2.5 MG capsule Take 2.5 mg by mouth 2 (two) times daily before a meal.    [provider]  gabapentin (NEURONTIN) 300 MG capsule Take 1 capsule (300 mg total) by mouth at bedtime. 08/22/18   Mikhail, Velta Addison, DO  HYDROmorphone (DILAUDID) 2 MG tablet Take 2-4 mg  by mouth every 4 (four) hours.     [provider]  metoprolol tartrate (LOPRESSOR) 25 MG tablet Take 0.5 tablets (12.5 mg total) by mouth 2 (two) times daily. Do not take in the mornings when you have dialysis. 02/22/20 02/16/21  Sherren Mocha, MD  nitroGLYCERIN (NITROSTAT) 0.4 MG SL tablet Place 1 tablet (0.4 mg total) under the tongue every 5 (five) minutes as needed for chest pain. 02/22/20   Sherren Mocha, MD  OXYGEN Inhale 2 L into the lungs at bedtime.     [provider]  Pancrelipase, Lip-Prot-Amyl, (ZENPEP) 20000-63000 units CPEP Take 1 capsule by mouth 3 (three) times daily. 01/24/20   Lucious Groves, DO  pantoprazole (PROTONIX) 40 MG tablet Take 1 tablet (40 mg total) by mouth daily. 08/03/18   Patrecia Pour, MD  promethazine (PHENERGAN) 12.5 MG tablet Take 12.5 mg by mouth every 4 (four) hours as needed for nausea or vomiting.    [provider]  traZODone (DESYREL) 100 MG tablet Take 150 mg by mouth at bedtime.  04/19/19   [provider]    Allergies Isosorbide, Codeine, Contrast media [iodinated diagnostic agents], Ioxaglate, Isosorbide nitrate, Metrizamide, Oxycodone-acetaminophen, and Percocet [oxycodone-acetaminophen]  Family History  Problem Relation Age of Onset  . Emphysema Mother   . Heart disease Mother        before age 2  . Hypertension Mother   . Hyperlipidemia Mother   . Heart attack Mother   . AAA (abdominal aortic aneurysm) Mother        rupture  . Heart attack Father 41  . Emphysema Father   . Heart disease Father        before age 39  . Hyperlipidemia Father   . Hypertension Father   . Peripheral vascular disease Father   . Diabetes Brother   . Heart disease Brother        before age 11  . Hyperlipidemia Brother   . Hypertension Brother   . Heart attack Brother        CABG  . Heart disease Other        Vascular disease in grandparents, uncles and dad  . Colon cancer Neg Hx   . Esophageal cancer Neg Hx   .  Inflammatory bowel disease Neg Hx   . Liver disease Neg Hx   . Pancreatic cancer Neg Hx   . Rectal cancer Neg Hx   .  Stomach cancer Neg Hx     Social History Social History   Tobacco Use  . Smoking status: Current Every Day Smoker    Packs/day: 0.75    Years: 50.00    Pack years: 37.50    Types: Cigarettes  . Smokeless tobacco: Never Used  . Tobacco comment: 3/4 pk per day  Vaping Use  . Vaping Use: Never used  Substance Use Topics  . Alcohol use: Yes    Alcohol/week: 0.0 standard drinks  . Drug use: Yes    Types: Marijuana    Review of Systems Constitutional: No fever/chills Eyes: No visual changes. ENT: No sore throat. Cardiovascular: Denies chest pain. Respiratory: Denies shortness of breath. Gastrointestinal: Positive for chronic abdominal pain. Positive for decreased oral intake.  Genitourinary: Negative for dysuria. Musculoskeletal: Positive for pain to right buttock.  Skin: Positive for wound to right buttock.  Neurological: Negative for headaches, focal weakness or numbness.  ____________________________________________   PHYSICAL EXAM:  VITAL SIGNS: ED Triage Vitals  Enc Vitals Group     BP 04/19/20 1218 95/63     Pulse Rate 04/19/20 1218 72     Resp 04/19/20 1218 18     Temp 04/19/20 1218 98.9 F (37.2 C)     Temp Source 04/19/20 1218 Oral     SpO2 04/19/20 1218 95 %     Weight 04/19/20 1218 83 lb (37.6 kg)     Height 04/19/20 1218 5\' 5"  (1.651 m)     Head Circumference --      Peak Flow --      Pain Score 04/19/20 1235 10   Constitutional: Alert and oriented.  Eyes: Conjunctivae are normal.  ENT      Head: Normocephalic and atraumatic.      Nose: No congestion/rhinnorhea.      Mouth/Throat: Mucous membranes are moist.      Neck: No stridor. Hematological/Lymphatic/Immunilogical: No cervical lymphadenopathy. Cardiovascular: Normal rate, regular rhythm.  No murmurs, rubs, or gallops.  Respiratory: Normal respiratory effort without  tachypnea nor retractions. Breath sounds are clear and equal bilaterally. No wheezes/rales/rhonchi. Gastrointestinal: Soft and minimally tender.  Genitourinary: Deferred Musculoskeletal: Normal range of motion in all extremities. No lower extremity edema. Neurologic:  Normal speech and language. No gross focal neurologic deficits are appreciated.  Skin:  Roughly 4 cm diameter wound to right buttock with small amount of surrounding erythema.  Psychiatric: Mood and affect are normal. Speech and behavior are normal. Patient exhibits appropriate insight and judgment.  ____________________________________________    LABS (pertinent positives/negatives)  CBC wbc 9.5, hgb 10.7, plt 152 BMP na 135, k 4.1, glu 96, cr 2.90  ____________________________________________   EKG  I, Nance Pear, attending physician, personally viewed and interpreted this EKG  EKG Time: 1241 Rate: 79 Rhythm: sinus rhythm Axis: normal Intervals: qtc 488 QRS: RBBB ST changes: no st elevation Impression: abnormal ekg   ____________________________________________    RADIOLOGY  CT pending  ____________________________________________   PROCEDURES  Procedures  ____________________________________________   INITIAL IMPRESSION / ASSESSMENT AND PLAN / ED COURSE  Pertinent labs & imaging results that were available during my care of the patient were reviewed by me and considered in my medical decision making (see chart for details).   Patient presented to the emergency department today with complaints of wound to the right buttock as well as weakness.  On exam patient does have a roughly 4 cm wound to the right buttock.  There is some small area of surrounding erythema.  The  wound itself has some necrotic tissue and discharge.  No foul odor.  However patient is complaining of significant pain.  It is overlying osseous structure.  I do have low suspicion for osteomyelitis given lack of communication  however will get CT scan to evaluate for any obvious bone abnormality.  Patient's blood work here without any leukocytosis.  Will give patient pain medication.  Terms of the weakness still awaiting urine.  Will check troponin given history of heart disease.  With a small amount of IV fluids given decreased oral intake.  ___________________________________________   FINAL CLINICAL IMPRESSION(S) / ED DIAGNOSES  Final diagnoses:  Wound of right buttock, initial encounter  Weakness     Note: This dictation was prepared with Dragon dictation. Any transcriptional errors that result from this process are unintentional     Nance Pear, MD 04/19/20 2255

## 2020-04-20 DIAGNOSIS — R64 Cachexia: Secondary | ICD-10-CM | POA: Diagnosis present

## 2020-04-20 DIAGNOSIS — R630 Anorexia: Secondary | ICD-10-CM | POA: Diagnosis not present

## 2020-04-20 DIAGNOSIS — Z66 Do not resuscitate: Secondary | ICD-10-CM | POA: Diagnosis not present

## 2020-04-20 DIAGNOSIS — E871 Hypo-osmolality and hyponatremia: Secondary | ICD-10-CM | POA: Diagnosis present

## 2020-04-20 DIAGNOSIS — Z515 Encounter for palliative care: Secondary | ICD-10-CM | POA: Diagnosis not present

## 2020-04-20 DIAGNOSIS — N390 Urinary tract infection, site not specified: Secondary | ICD-10-CM

## 2020-04-20 DIAGNOSIS — R634 Abnormal weight loss: Secondary | ICD-10-CM | POA: Diagnosis not present

## 2020-04-20 DIAGNOSIS — I480 Paroxysmal atrial fibrillation: Secondary | ICD-10-CM | POA: Diagnosis not present

## 2020-04-20 DIAGNOSIS — K253 Acute gastric ulcer without hemorrhage or perforation: Secondary | ICD-10-CM | POA: Diagnosis not present

## 2020-04-20 DIAGNOSIS — R531 Weakness: Secondary | ICD-10-CM | POA: Diagnosis present

## 2020-04-20 DIAGNOSIS — J9601 Acute respiratory failure with hypoxia: Secondary | ICD-10-CM | POA: Diagnosis not present

## 2020-04-20 DIAGNOSIS — R638 Other symptoms and signs concerning food and fluid intake: Secondary | ICD-10-CM

## 2020-04-20 DIAGNOSIS — K259 Gastric ulcer, unspecified as acute or chronic, without hemorrhage or perforation: Secondary | ICD-10-CM | POA: Diagnosis present

## 2020-04-20 DIAGNOSIS — E43 Unspecified severe protein-calorie malnutrition: Secondary | ICD-10-CM | POA: Diagnosis present

## 2020-04-20 DIAGNOSIS — I4821 Permanent atrial fibrillation: Secondary | ICD-10-CM | POA: Diagnosis present

## 2020-04-20 DIAGNOSIS — Z951 Presence of aortocoronary bypass graft: Secondary | ICD-10-CM | POA: Diagnosis not present

## 2020-04-20 DIAGNOSIS — N2581 Secondary hyperparathyroidism of renal origin: Secondary | ICD-10-CM | POA: Diagnosis present

## 2020-04-20 DIAGNOSIS — L089 Local infection of the skin and subcutaneous tissue, unspecified: Secondary | ICD-10-CM | POA: Diagnosis present

## 2020-04-20 DIAGNOSIS — L89314 Pressure ulcer of right buttock, stage 4: Secondary | ICD-10-CM | POA: Diagnosis present

## 2020-04-20 DIAGNOSIS — S31819A Unspecified open wound of right buttock, initial encounter: Secondary | ICD-10-CM | POA: Diagnosis not present

## 2020-04-20 DIAGNOSIS — R1310 Dysphagia, unspecified: Secondary | ICD-10-CM | POA: Diagnosis not present

## 2020-04-20 DIAGNOSIS — Z992 Dependence on renal dialysis: Secondary | ICD-10-CM | POA: Diagnosis not present

## 2020-04-20 DIAGNOSIS — Z681 Body mass index (BMI) 19 or less, adult: Secondary | ICD-10-CM | POA: Diagnosis not present

## 2020-04-20 DIAGNOSIS — N186 End stage renal disease: Secondary | ICD-10-CM | POA: Diagnosis present

## 2020-04-20 DIAGNOSIS — K209 Esophagitis, unspecified without bleeding: Secondary | ICD-10-CM | POA: Diagnosis not present

## 2020-04-20 DIAGNOSIS — E1122 Type 2 diabetes mellitus with diabetic chronic kidney disease: Secondary | ICD-10-CM | POA: Diagnosis present

## 2020-04-20 DIAGNOSIS — N3001 Acute cystitis with hematuria: Secondary | ICD-10-CM | POA: Diagnosis present

## 2020-04-20 DIAGNOSIS — L89151 Pressure ulcer of sacral region, stage 1: Secondary | ICD-10-CM | POA: Diagnosis present

## 2020-04-20 DIAGNOSIS — I5033 Acute on chronic diastolic (congestive) heart failure: Secondary | ICD-10-CM | POA: Diagnosis present

## 2020-04-20 DIAGNOSIS — L89312 Pressure ulcer of right buttock, stage 2: Secondary | ICD-10-CM

## 2020-04-20 DIAGNOSIS — J449 Chronic obstructive pulmonary disease, unspecified: Secondary | ICD-10-CM | POA: Diagnosis present

## 2020-04-20 DIAGNOSIS — Z20822 Contact with and (suspected) exposure to covid-19: Secondary | ICD-10-CM | POA: Diagnosis present

## 2020-04-20 DIAGNOSIS — R1314 Dysphagia, pharyngoesophageal phase: Secondary | ICD-10-CM | POA: Diagnosis present

## 2020-04-20 DIAGNOSIS — I132 Hypertensive heart and chronic kidney disease with heart failure and with stage 5 chronic kidney disease, or end stage renal disease: Secondary | ICD-10-CM | POA: Diagnosis present

## 2020-04-20 DIAGNOSIS — T148XXA Other injury of unspecified body region, initial encounter: Secondary | ICD-10-CM | POA: Diagnosis present

## 2020-04-20 DIAGNOSIS — R627 Adult failure to thrive: Secondary | ICD-10-CM | POA: Diagnosis present

## 2020-04-20 DIAGNOSIS — D175 Benign lipomatous neoplasm of intra-abdominal organs: Secondary | ICD-10-CM | POA: Diagnosis not present

## 2020-04-20 DIAGNOSIS — K635 Polyp of colon: Secondary | ICD-10-CM | POA: Diagnosis not present

## 2020-04-20 DIAGNOSIS — K2210 Ulcer of esophagus without bleeding: Secondary | ICD-10-CM | POA: Diagnosis present

## 2020-04-20 LAB — GLUCOSE, CAPILLARY
Glucose-Capillary: 53 mg/dL — ABNORMAL LOW (ref 70–99)
Glucose-Capillary: 55 mg/dL — ABNORMAL LOW (ref 70–99)
Glucose-Capillary: 100 mg/dL — ABNORMAL HIGH (ref 70–99)
Glucose-Capillary: 77 mg/dL (ref 70–99)
Glucose-Capillary: 120 mg/dL — ABNORMAL HIGH (ref 70–99)
Glucose-Capillary: 162 mg/dL — ABNORMAL HIGH (ref 70–99)

## 2020-04-20 LAB — URINALYSIS, COMPLETE (UACMP) WITH MICROSCOPIC
Bilirubin Urine: NEGATIVE
Glucose, UA: NEGATIVE mg/dL
Hgb urine dipstick: NEGATIVE
Ketones, ur: NEGATIVE mg/dL
Nitrite: NEGATIVE
Protein, ur: NEGATIVE mg/dL
Specific Gravity, Urine: 1.021 (ref 1.005–1.030)
Squamous Epithelial / HPF: >50 — ABNORMAL HIGH (ref 0–5)
WBC, UA: >50 WBC/hpf — ABNORMAL HIGH (ref 0–5)
pH: 5 (ref 5.0–8.0)

## 2020-04-20 LAB — CBC
HCT: 32.4 % — ABNORMAL LOW (ref 36.0–46.0)
Hemoglobin: 11.1 g/dL — ABNORMAL LOW (ref 12.0–15.0)
MCH: 35.2 pg — ABNORMAL HIGH (ref 26.0–34.0)
MCHC: 34.3 g/dL (ref 30.0–36.0)
MCV: 102.9 fL — ABNORMAL HIGH (ref 80.0–100.0)
Platelets: 176 10*3/uL (ref 150–400)
RBC: 3.15 MIL/uL — ABNORMAL LOW (ref 3.87–5.11)
RDW: 14.6 % (ref 11.5–15.5)
WBC: 10 10*3/uL (ref 4.0–10.5)
nRBC: 0 % (ref 0.0–0.2)

## 2020-04-20 LAB — SARS CORONAVIRUS 2 BY RT PCR (HOSPITAL ORDER, PERFORMED IN ~~LOC~~ HOSPITAL LAB): SARS Coronavirus 2: NEGATIVE

## 2020-04-20 LAB — CREATININE, SERUM
Creatinine, Ser: 3.77 mg/dL — ABNORMAL HIGH (ref 0.44–1.00)
GFR calc Af Amer: 14 mL/min — ABNORMAL LOW (ref >60)
GFR calc non Af Amer: 12 mL/min — ABNORMAL LOW (ref >60)

## 2020-04-20 LAB — HEMOGLOBIN A1C
Hgb A1c MFr Bld: 5.3 % (ref 4.8–5.6)
Mean Plasma Glucose: 105.41 mg/dL

## 2020-04-20 LAB — TROPONIN I (HIGH SENSITIVITY): Troponin I (High Sensitivity): 37 ng/L — ABNORMAL HIGH (ref <18)

## 2020-04-20 LAB — PROCALCITONIN: Procalcitonin: 5.32 ng/mL

## 2020-04-20 MED ORDER — INSULIN ASPART 100 UNIT/ML ~~LOC~~ SOLN: 0.0000 [IU] | Freq: Three times a day (TID) | SUBCUTANEOUS | Status: DC

## 2020-04-20 MED ORDER — METOPROLOL TARTRATE 25 MG PO TABS
12.5000 mg | ORAL_TABLET | Freq: Two times a day (BID) | ORAL | Status: DC
  Administered 2020-04-20 – 2020-05-02 (×19): 12.5 mg via ORAL
  Filled 2020-04-20 (×21): qty 1

## 2020-04-20 MED ORDER — ACETAMINOPHEN 325 MG PO TABS
650.0000 mg | ORAL_TABLET | Freq: Four times a day (QID) | ORAL | Status: DC | PRN
  Administered 2020-04-26 – 2020-05-01 (×3): 650 mg via ORAL
  Filled 2020-04-20 (×3): qty 2

## 2020-04-20 MED ORDER — INSULIN ASPART 100 UNIT/ML ~~LOC~~ SOLN: 0.0000 [IU] | Freq: Every day | SUBCUTANEOUS | Status: DC

## 2020-04-20 MED ORDER — ALBUTEROL SULFATE (2.5 MG/3ML) 0.083% IN NEBU: 3.0000 mL | INHALATION_SOLUTION | Freq: Four times a day (QID) | RESPIRATORY_TRACT | Status: DC | PRN

## 2020-04-20 MED ORDER — HYDROMORPHONE HCL 2 MG PO TABS
2.0000 mg | ORAL_TABLET | ORAL | Status: DC
  Administered 2020-04-20 – 2020-04-21 (×4): 2 mg via ORAL
  Filled 2020-04-20 (×4): qty 1

## 2020-04-20 MED ORDER — GABAPENTIN 300 MG PO CAPS
300.0000 mg | ORAL_CAPSULE | Freq: Every day | ORAL | Status: DC
  Administered 2020-04-20 – 2020-04-25 (×6): 300 mg via ORAL
  Filled 2020-04-20 (×6): qty 1

## 2020-04-20 MED ORDER — HYDROMORPHONE HCL 2 MG PO TABS: 2.0000 mg | ORAL_TABLET | ORAL | Status: DC | PRN

## 2020-04-20 MED ORDER — PROMETHAZINE HCL 25 MG PO TABS
12.5000 mg | ORAL_TABLET | ORAL | Status: DC | PRN
  Administered 2020-05-04: 12.5 mg via ORAL
  Filled 2020-04-20 (×2): qty 1

## 2020-04-20 MED ORDER — ATORVASTATIN CALCIUM 20 MG PO TABS
40.0000 mg | ORAL_TABLET | Freq: Every day | ORAL | Status: DC
  Administered 2020-04-20 – 2020-05-01 (×12): 40 mg via ORAL
  Filled 2020-04-20 (×12): qty 2

## 2020-04-20 MED ORDER — HYDROMORPHONE HCL 1 MG/ML IJ SOLN
1.0000 mg | INTRAMUSCULAR | Status: DC | PRN
  Administered 2020-04-20 – 2020-04-22 (×8): 1 mg via INTRAVENOUS
  Filled 2020-04-20 (×8): qty 1

## 2020-04-20 MED ORDER — CLOPIDOGREL BISULFATE 75 MG PO TABS
75.0000 mg | ORAL_TABLET | Freq: Every day | ORAL | Status: DC
  Administered 2020-04-20 – 2020-04-24 (×5): 75 mg via ORAL
  Filled 2020-04-20 (×5): qty 1

## 2020-04-20 MED ORDER — SERTRALINE HCL 50 MG PO TABS
50.0000 mg | ORAL_TABLET | Freq: Every day | ORAL | Status: DC
  Administered 2020-04-20 – 2020-05-02 (×12): 50 mg via ORAL
  Filled 2020-04-20 (×12): qty 1

## 2020-04-20 MED ORDER — PANCRELIPASE (LIP-PROT-AMYL) 12000-38000 UNITS PO CPEP
24000.0000 [IU] | ORAL_CAPSULE | Freq: Three times a day (TID) | ORAL | Status: DC
  Administered 2020-04-20 – 2020-05-04 (×35): 24000 [IU] via ORAL
  Filled 2020-04-20 (×46): qty 2

## 2020-04-20 MED ORDER — HEPARIN SODIUM (PORCINE) 5000 UNIT/ML IJ SOLN
5000.0000 [IU] | Freq: Three times a day (TID) | INTRAMUSCULAR | Status: DC
  Administered 2020-04-20 – 2020-05-04 (×41): 5000 [IU] via SUBCUTANEOUS
  Filled 2020-04-20 (×42): qty 1

## 2020-04-20 MED ORDER — NITROGLYCERIN 0.4 MG SL SUBL: 0.4000 mg | SUBLINGUAL_TABLET | SUBLINGUAL | Status: DC | PRN

## 2020-04-20 MED ORDER — HYDROMORPHONE HCL 1 MG/ML IJ SOLN
1.0000 mg | Freq: Once | INTRAMUSCULAR | Status: AC
  Administered 2020-04-20: 1 mg via INTRAVENOUS
  Filled 2020-04-20: qty 1

## 2020-04-20 MED ORDER — SODIUM CHLORIDE 0.9 % IV SOLN
1.0000 g | Freq: Once | INTRAVENOUS | Status: AC
  Administered 2020-04-20: 1 g via INTRAVENOUS
  Filled 2020-04-20: qty 10

## 2020-04-20 MED ORDER — ALLOPURINOL 100 MG PO TABS
100.0000 mg | ORAL_TABLET | Freq: Every day | ORAL | Status: DC
  Administered 2020-04-20 – 2020-05-01 (×12): 100 mg via ORAL
  Filled 2020-04-20 (×13): qty 1

## 2020-04-20 MED ORDER — FENTANYL CITRATE (PF) 100 MCG/2ML IJ SOLN
100.0000 ug | Freq: Once | INTRAMUSCULAR | Status: AC
  Administered 2020-04-20: 100 ug via INTRAVENOUS
  Filled 2020-04-20: qty 2

## 2020-04-20 MED ORDER — TRAZODONE HCL 50 MG PO TABS
150.0000 mg | ORAL_TABLET | Freq: Every day | ORAL | Status: DC
  Administered 2020-04-20 – 2020-05-01 (×12): 150 mg via ORAL
  Filled 2020-04-20 (×12): qty 1

## 2020-04-20 MED ORDER — SODIUM CHLORIDE 0.9 % IV SOLN: 1.0000 g | INTRAVENOUS | Status: DC

## 2020-04-20 MED ORDER — DRONABINOL 2.5 MG PO CAPS
2.5000 mg | ORAL_CAPSULE | Freq: Two times a day (BID) | ORAL | Status: DC
  Administered 2020-04-21 – 2020-04-27 (×12): 2.5 mg via ORAL
  Filled 2020-04-20 (×13): qty 1

## 2020-04-20 MED ORDER — ACETAMINOPHEN 650 MG RE SUPP: 650.0000 mg | Freq: Four times a day (QID) | RECTAL | Status: DC | PRN

## 2020-04-20 MED ORDER — PANTOPRAZOLE SODIUM 40 MG PO TBEC
40.0000 mg | DELAYED_RELEASE_TABLET | Freq: Every day | ORAL | Status: DC
  Administered 2020-04-20 – 2020-04-28 (×9): 40 mg via ORAL
  Filled 2020-04-20 (×9): qty 1

## 2020-04-20 MED ORDER — ONDANSETRON HCL 4 MG/2ML IJ SOLN
4.0000 mg | Freq: Four times a day (QID) | INTRAMUSCULAR | Status: DC | PRN
  Administered 2020-04-20 – 2020-05-05 (×7): 4 mg via INTRAVENOUS
  Filled 2020-04-20 (×7): qty 2

## 2020-04-20 MED ORDER — SODIUM CHLORIDE 0.9 % IV SOLN
1.5000 g | Freq: Two times a day (BID) | INTRAVENOUS | Status: DC
  Administered 2020-04-20 – 2020-04-22 (×4): 1.5 g via INTRAVENOUS
  Filled 2020-04-20 (×3): qty 1.5
  Filled 2020-04-20 (×2): qty 4

## 2020-04-20 MED ORDER — BOOST / RESOURCE BREEZE PO LIQD CUSTOM
1.0000 | Freq: Three times a day (TID) | ORAL | Status: DC
  Administered 2020-04-20 – 2020-05-02 (×22): 1 via ORAL

## 2020-04-20 MED ORDER — MIDODRINE HCL 5 MG PO TABS
10.0000 mg | ORAL_TABLET | ORAL | Status: DC | PRN
  Filled 2020-04-20 (×2): qty 2

## 2020-04-20 MED ORDER — COLLAGENASE 250 UNIT/GM EX OINT
TOPICAL_OINTMENT | Freq: Every day | CUTANEOUS | Status: DC
  Filled 2020-04-20 (×2): qty 30

## 2020-04-20 MED ORDER — ALUM & MAG HYDROXIDE-SIMETH 200-200-20 MG/5ML PO SUSP
15.0000 mL | ORAL | Status: DC | PRN
  Administered 2020-04-20 – 2020-05-03 (×10): 15 mL via ORAL
  Filled 2020-04-20 (×10): qty 30

## 2020-04-20 MED ORDER — ONDANSETRON HCL 4 MG PO TABS: 4.0000 mg | ORAL_TABLET | Freq: Four times a day (QID) | ORAL | Status: DC | PRN

## 2020-04-20 MED ORDER — HYDROMORPHONE HCL 2 MG PO TABS: 2.0000 mg | ORAL_TABLET | ORAL | Status: DC

## 2020-04-20 NOTE — ED Notes (Signed)
Pt states she takes 4mg  of dilaudid every 3 hours at home and is questioning doseage of dilaudid currently. Pt appears in no acute distress and had to be awoken from sleep for assessment. Pt has not consumed any of dinner tray and asks for food to be discarded.

## 2020-04-20 NOTE — H&P (Signed)
History and Physical    [name] KVQ:[id number] DOB: November 19, 1950 DOA: 04/19/2020  PCP: Bernerd Limbo, MD   Patient coming from: home  I have personally briefly reviewed patient's old medical records in [place]  Chief Complaint: poor oral intake  HPI: [name] is a 69 y.o. female with medical history significant for s/p CABG and DES, s/p mitral valve repair, ESRD on TTS HD, atrial fibrillation s/p Maze procedure 2012 not on anticoagulation, chronic diastolic CHF (EF 64-33%, I9JJ by TTE 05/2018), PAD s/p b/l renal artery stents and left subclavian stent, AAA s/p endovascular repair 2019, chronic pancreatitis on chronic narcotics, COPD on 2 L supplemental O2 via [place] at night, HTN, HLD, RBBB, and depression, who presents to the emergency room with a several day history of weakness to where she has been able to get out of bed associated with poor oral intake.  States that she noticed a wound on her right buttock in the past few days that has been very painful, more so as she has been unable to get up and get around.  She also reports continued complaints related to her chronic pancreatitis but denies any vomiting or diarrhea. ED Course: On arrival, BP was borderline at 95/63 with otherwise normal vitals.  Blood work was at baseline for her ESRD.  Urinalysis showed pyuria.  Hospitalist consulted for admission.  Review of Systems: As per HPI otherwise all other systems on review of systems negative.    Past Medical History:  Diagnosis Date  . AAA (abdominal aortic aneurysm) ([place]) 5/09   3.7x3.3 by u/s 2009  . Abnormal EKG    deep TW inversions chronic  . Anemia   . CAD (coronary artery disease)    a. CABG 03/2011 LIMA to LAD, SVG to OM, SVG to PDA. b. cath 06/25/2015 DES to SVG to rPDA, patent LIMA to LAD, patent SVG to OM  . carotid stenosis 5/09   S/p L CEA ;  60-79% bilat ICA by preCABG dopplers 7/12  . CHF (congestive heart failure) ([place])   . Chronic back pain    "all over"  (06/24/2015)  . Chronic bronchitis ([place])    "get it pretty much q yr" (06/24/2015)  . CKD (chronic kidney disease)   . Complication of anesthesia 1966   "problem w/ether"  . COPD (chronic obstructive pulmonary disease) ([place])   . Depression   . Dysrhythmia   . GERD (gastroesophageal reflux disease)    With hiatal hernia  . GIB (gastrointestinal bleeding) 9/11   S/p EGD with cautery at [facility]  . Heart murmur   . History of blood transfusion "a few times"   "all related to anemia" (06/24/2015)  . History of hiatal hernia   . Hyperlipidemia   . Hypertension   . Mitral regurgitation    3+ MR by intraoperative TEE;  s/p MV repair with Dr. Roxy Manns 7/12  . Myocardial infarction [facility]) 2012 "several"  . On home oxygen therapy    "2 liters at night; negative for sleep apnea"  . PAD (peripheral artery disease) (HCC)    Severe; s/p bilateral renal artery stents, moderate in-stent restenosis  . Pancreatitis 05/2018  . Paroxysmal atrial fibrillation (HCC)    coumadin;  echo 9/07: EF 60%, mild LVH;  s/p Cox Maze 7/12 with LAA clipping  . Subclavian artery stenosis, left (HCC)    stented by Dr. Trula Slade on 10/18 to help flow of her LIMA to LAD  . Type II diabetes mellitus ([place])  Past Surgical History:  Procedure Laterality Date  . A/V FISTULAGRAM Left 03/21/2018   Procedure: A/V FISTULAGRAM;  Surgeon: Serafina Mitchell, MD;  Location: [place] [facility];  Service: Cardiovascular;  Laterality: Left;  . ABDOMINAL AORTIC ENDOVASCULAR STENT GRAFT N/A 01/17/2018   Procedure: ABDOMINAL AORTIC ENDOVASCULAR STENT GRAFT WITH CO2;  Surgeon: Serafina Mitchell, MD;  Location: [place];  Service: Vascular;  Laterality: N/A;  . ABDOMINAL HYSTERECTOMY  1990's  . APPENDECTOMY  Aug. 11, 2016   Ruptured  . AV FISTULA PLACEMENT Left 11/03/2017   Procedure: ARTERIOVENOUS (AV) FISTULA CREATION LEFT ARM;  Surgeon: Serafina Mitchell, MD;  Location: [facility];  Service: Vascular;  Laterality: Left;  . BOWEL RESECTION  2013  .  CARDIAC CATHETERIZATION N/A 06/24/2015   Procedure: Left Heart Cath and Cors/Grafts Angiography;  Surgeon: Leonie Man, MD;  Location: [place] [facility];  Service: Cardiovascular;  Laterality: N/A;  . CARDIAC CATHETERIZATION N/A 06/25/2015   Procedure: Coronary Stent Intervention;  Surgeon: Sherren Mocha, MD;  Location: [facility];  Service: Cardiovascular;  Laterality: N/A;  . CAROTID ENDARTERECTOMY Left   . CATARACT EXTRACTION Right 03/27/2018  . CHOLECYSTECTOMY N/A 04/19/2018   Procedure: LAPAROSCOPIC CHOLECYSTECTOMY WITH INTRAOPERATIVE CHOLANGIOGRAM;  Surgeon: Rolm Bookbinder, MD;  Location: [facility];  Service: General;  Laterality: N/A;  . CORONARY ANGIOPLASTY    . CORONARY ARTERY BYPASS GRAFT  03/05/2011   CABG X3 (LIMA to LAD, SVG to OM, SVG to PDA, EVH via left thigh  . CORONARY STENT PLACEMENT    . ESOPHAGOGASTRODUODENOSCOPY N/A 04/12/2018   Procedure: ESOPHAGOGASTRODUODENOSCOPY (EGD);  Surgeon: [name], MD;  [facility];  Service: Endoscopy;  Laterality: N/A;  . ESOPHAGOGASTRODUODENOSCOPY N/A 06/16/2018   Procedure: ESOPHAGOGASTRODUODENOSCOPY (EGD);  Surgeon: Thornton Park, MD;  Location: [facility];  Service: Gastroenterology;  Laterality: N/A;  . IR AV DIALY SHUNT INTRO NEEDLE/INTRACATH INITIAL W/PTA/IMG LEFT  01/23/2020  . IR RADIOLOGIST EVAL & MGMT  09/13/2019  . IR RADIOLOGIST EVAL & MGMT  10/31/2019  . IR RADIOLOGIST EVAL & MGMT  11/14/2019  . IR RADIOLOGIST EVAL & MGMT  01/02/2020  . IR US GUIDE VASC ACCESS LEFT  01/23/2020  . LACERATION REPAIR Right 1990's   WRIST  . MAZE  03/05/2011   complete biatrial lesion set with clipping of LA appendage  . MITRAL VALVE REPAIR  03/05/2011   83mm Memo 3D ring annuloplasty for ischemic MR  . PERIPHERAL VASCULAR BALLOON ANGIOPLASTY Left 03/21/2018   Procedure: PERIPHERAL VASCULAR BALLOON ANGIOPLASTY;  Surgeon: Serafina Mitchell, MD;  Location: [facility];  Service: Cardiovascular;  Laterality: Left;    . PERIPHERAL VASCULAR CATHETERIZATION N/A 06/24/2015   Procedure: Aortic Arch Angiography;  Surgeon: Serafina Mitchell, MD;  Location: [place];  Service: Cardiovascular;  Laterality: N/A;  . PERIPHERAL VASCULAR CATHETERIZATION  06/24/2015   Procedure: Peripheral Vascular Intervention;  Surgeon: Serafina Mitchell, MD;  Location: [place] [facility];  Service: Cardiovascular;;  . PERIPHERAL VASCULAR CATHETERIZATION N/A 06/24/2015   Procedure: Abdominal Aortogram;  Surgeon: Serafina Mitchell, MD;  Location: [facility];  Service: Cardiovascular;  Laterality: N/A;  . RENAL ANGIOGRAM Bilateral 09/11/2013   Procedure: RENAL ANGIOGRAM;  Surgeon: Serafina Mitchell, MD;  Location: [facility] CATH LAB;  Service: Cardiovascular;  Laterality: Bilateral;  . RIGHT FEMORAL-POPLITEAL BYPASS       reports that she has been smoking cigarettes. She has a 37.50 pack-year smoking history. She has never used smokeless tobacco.  She reports current alcohol use. She reports current drug use. Drug: Marijuana.  Allergies  Allergen Reactions  . Isosorbide Other (See Comments)    Can only tolerate in low doses (unknown reaction) Other reaction(s): Other Unknown "FELT LIKE I WAS GOING TO EXPLODE FROM INSIDE OUT"  . Codeine Itching  . Contrast Media [Iodinated Diagnostic Agents] Itching and Other (See Comments)    Itching of feet  . Ioxaglate Itching and Other (See Comments)    Itching of feet  . Isosorbide Nitrate Other (See Comments)    Unknown   . Metrizamide Itching and Other (See Comments)    Itching of feet  . Oxycodone-Acetaminophen Itching  . Percocet [Oxycodone-Acetaminophen] Itching and Other (See Comments)    Tolerates Hydrocodone    Family History  Problem Relation Age of Onset  . Emphysema Mother   . Heart disease Mother        before age 17  . Hypertension Mother   . Hyperlipidemia Mother   . Heart attack Mother   . AAA (abdominal aortic aneurysm) Mother        rupture  . Heart attack  Father 27  . Emphysema Father   . Heart disease Father        before age 28  . Hyperlipidemia Father   . Hypertension Father   . Peripheral vascular disease Father   . Diabetes Brother   . Heart disease Brother        before age 18  . Hyperlipidemia Brother   . Hypertension Brother   . Heart attack Brother        CABG  . Heart disease Other        Vascular disease in grandparents, uncles and dad  . Colon cancer Neg Hx   . Esophageal cancer Neg Hx   . Inflammatory bowel disease Neg Hx   . Liver disease Neg Hx   . Pancreatic cancer Neg Hx   . Rectal cancer Neg Hx   . Stomach cancer Neg Hx       Prior to Admission medications   Medication Sig Start Date End Date Taking? Authorizing Provider  albuterol (VENTOLIN HFA) 108 (90 Base) MCG/ACT inhaler Inhale 1-2 puffs into the lungs every 6 (six) hours as needed for wheezing or shortness of breath.   Yes [provider]  allopurinol (ZYLOPRIM) 100 MG tablet Take 100 mg by mouth at bedtime.  02/02/17  Yes [provider]  atorvastatin (LIPITOR) 40 MG tablet Take 40 mg by mouth at bedtime.  12/07/17  Yes [provider]  B Complex-C-Zn-Folic Acid (DIALYVITE 176-HYWV 15) 0.8 MG TABS Take 1 tablet by mouth daily. 03/31/20  Yes [provider]  clopidogrel (PLAVIX) 75 MG tablet Take 75 mg by mouth at bedtime.  10/02/19  Yes [provider]  dronabinol (MARINOL) 2.5 MG capsule Take 2.5 mg by mouth 2 (two) times daily before a meal.   Yes [provider]  gabapentin (NEURONTIN) 300 MG capsule Take 1 capsule (300 mg total) by mouth at bedtime. 08/22/18  Yes Mikhail, Maryann, DO  HYDROmorphone (DILAUDID) 2 MG tablet Take 2-4 mg by mouth every 4 (four) hours.    Yes [provider]  lidocaine-prilocaine (EMLA) cream Apply 1 application topically 3 (three) times a week. 03/31/20  Yes [provider]  metoprolol tartrate (LOPRESSOR) 25 MG tablet Take 0.5 tablets (12.5 mg total) by  mouth 2 (two) times daily. Do not take in the mornings when you have dialysis. 02/22/20 02/16/21  Yes Sherren Mocha, MD  midodrine (PROAMATINE) 10 MG tablet TAKE 1 TABLET BY MOUTH AS DIRECTED BEFORE HEMODIALYSIS. MAY REPEAT 1 TIME 03/20/20  Yes [provider]  Pancrelipase, Lip-Prot-Amyl, (ZENPEP) 20000-63000 units CPEP Take 1 capsule by mouth 3 (three) times daily. 01/24/20  Yes Lucious Groves, DO  pantoprazole (PROTONIX) 40 MG tablet Take 1 tablet (40 mg total) by mouth daily. 08/03/18  Yes Patrecia Pour, MD  promethazine (PHENERGAN) 12.5 MG tablet Take 12.5 mg by mouth every 4 (four) hours as needed for nausea or vomiting.   Yes [provider]  sertraline (ZOLOFT) 50 MG tablet Take 50 mg by mouth daily. 04/02/20  Yes [provider]  traZODone (DESYREL) 100 MG tablet Take 150 mg by mouth at bedtime.  04/19/19  Yes [provider]  diphenhydrAMINE (BENADRYL) 25 MG tablet Take 50 mg by mouth at bedtime.    [provider]  nitroGLYCERIN (NITROSTAT) 0.4 MG SL tablet Place 1 tablet (0.4 mg total) under the tongue every 5 (five) minutes as needed for chest pain. 02/22/20   Sherren Mocha, MD  OXYGEN Inhale 2 L into the lungs at bedtime.     [provider]    Physical Exam: Vitals:   04/19/20 1218 04/19/20 1441 04/19/20 1715 04/19/20 2152  BP: 95/63 (!) 95/49 114/61 118/61  Pulse: 72 73 77 87  Resp: 18 20 (!) 22 19  Temp: 98.9 F (37.2 C)   98.9 F (37.2 C)  TempSrc: Oral   Oral  SpO2: 95% 96% 95% 97%  Weight: 37.6 kg     Height: 5\' 5"  (1.651 m)        Vitals:   04/19/20 1218 04/19/20 1441 04/19/20 1715 04/19/20 2152  BP: 95/63 (!) 95/49 114/61 118/61  Pulse: 72 73 77 87  Resp: 18 20 (!) 22 19  Temp: 98.9 F (37.2 C)   98.9 F (37.2 C)  TempSrc: Oral   Oral  SpO2: 95% 96% 95% 97%  Weight: 37.6 kg     Height: 5\' 5"  (1.651 m)         Constitutional:  Chronically ill-appearing but no distress HEENT:      Head: Normocephalic  and atraumatic.         Eyes: PERLA, EOMI, Conjunctivae are normal. Sclera is non-icteric.       Mouth/Throat: Mucous membranes are moist.       Neck: Supple with no signs of meningismus. Cardiovascular: Regular rate and rhythm. No murmurs, gallops, or rubs. 2+ symmetrical distal pulses are present . No JVD. No LE edema Respiratory: Respiratory effort normal .Lungs sounds clear bilaterally. No wheezes, crackles, or rhonchi.  Gastrointestinal: Soft, non tender, and non distended with positive bowel sounds. No rebound or guarding. Genitourinary: No CVA tenderness. Musculoskeletal: Nontender with normal range of motion in all extremities. No cyanosis, or erythema of extremities. Neurologic: Normal speech and language. Face is symmetric. Moving all extremities. No gross focal neurologic deficits . Skin: Skin is warm, dry. Unstageable sacral decubitus psychiatric: Mood and affect are normal Speech and behavior are normal   Labs on Admission: I have personally reviewed following labs and imaging studies  CBC: Recent Labs  Lab 04/19/20 1219  WBC 9.5  HGB 10.7*  HCT 31.4*  MCV 102.6*  PLT 660   Basic Metabolic Panel: Recent Labs  Lab 04/19/20 1219  NA 135  K 4.1  CL 99  CO2 26  GLUCOSE 96  BUN 27*  CREATININE 2.90*  CALCIUM 8.0*  GFR: Estimated Creatinine Clearance: 11.2 mL/min (A) (by C-G formula based on SCr of 2.9 mg/dL (H)). Liver Function Tests: No results for input(s): AST, ALT, ALKPHOS, BILITOT, PROT, ALBUMIN in the last 168 hours. No results for input(s): LIPASE, AMYLASE in the last 168 hours. No results for input(s): AMMONIA in the last 168 hours. Coagulation Profile: No results for input(s): INR, PROTIME in the last 168 hours. Cardiac Enzymes: No results for input(s): CKTOTAL, CKMB, CKMBINDEX, TROPONINI in the last 168 hours. BNP (last 3 results) No results for input(s): PROBNP in the last 8760 hours. HbA1C: No results for input(s): HGBA1C in the last 72  hours. CBG: No results for input(s): GLUCAP in the last 168 hours. Lipid Profile: No results for input(s): CHOL, HDL, LDLCALC, TRIG, CHOLHDL, LDLDIRECT in the last 72 hours. Thyroid Function Tests: No results for input(s): TSH, T4TOTAL, FREET4, T3FREE, THYROIDAB in the last 72 hours. Anemia Panel: No results for input(s): VITAMINB12, FOLATE, FERRITIN, TIBC, IRON, RETICCTPCT in the last 72 hours. Urine analysis:    Component Value Date/Time   COLORURINE AMBER (A) 04/19/2020 1206   APPEARANCEUR CLOUDY (A) 04/19/2020 1206   LABSPEC 1.021 04/19/2020 1206   PHURINE 5.0 04/19/2020 1206   GLUCOSEU NEGATIVE 04/19/2020 1206   HGBUR NEGATIVE 04/19/2020 1206   [place] 04/19/2020 1206   KETONESUR NEGATIVE 04/19/2020 1206   PROTEINUR NEGATIVE 04/19/2020 1206   UROBILINOGEN 1.0 09/28/2018 1934   NITRITE NEGATIVE 04/19/2020 1206   LEUKOCYTESUR MODERATE (A) 04/19/2020 1206    Radiological Exams on Admission: CT PELVIS WO CONTRAST  Result Date: 04/19/2020 CLINICAL DATA:  Right buttocks ulcer EXAM: CT PELVIS WITHOUT CONTRAST TECHNIQUE: Multidetector CT imaging of the pelvis was performed following the standard protocol without intravenous contrast. COMPARISON:  None. FINDINGS: Urinary Tract: The bladder is mildly distended and fluid-filled. Bowel: There is a moderate amount of rectal colonic stool present. An anastomosis seen within the rectum. Scattered colonic diverticula are noted. There is mild presacral edema. Vascular/Lymphatic: No enlarged pelvic lymph nodes identified. Again noted is a partially visualized aorto bi-iliac stent graft. Scattered aortic atherosclerotic calcifications are seen without aneurysmal dilatation. Reproductive: The patient is status post hysterectomy. No adnexal masses or collections seen. Other: Along the right posterior coccygeal soft tissues there is a superficial ulceration and heterogeneous skin thickening with non loculated fluid. No subcutaneous emphysema  seen. No areas of cortical destruction or periosteal reaction. There is diffuse anasarca noted. Musculoskeletal: No acute or significant osseous findings. IMPRESSION: Superficial ulceration overlying the right inferior coccyx. There is skin thickening with heterogeneous non loculated fluid/phlegmon. No loculated fluid collection, subcutaneous emphysema or definite evidence of osteomyelitis. Aortic Atherosclerosis (ICD10-I70.0). Aorto bi-iliac stent graft Electronically Signed   By: Prudencio Pair M.D.   On: 04/19/2020 23:17    EKG: Independently reviewed. Interpretation : No acute ST-T wave changes  Assessment/Plan 69 year old female s/p CABG and DES, s/p mitral valve repair, ESRD on TTS HD, atrial fibrillation s/p Maze procedure 2012 not on anticoagulation, chronic diastolic CHF (EF 78-24%, M3NT by TTE 05/2018), PAD s/p b/l renal artery stents and left subclavian stent, AAA s/p endovascular repair 2019, chronic pancreatitis on chronic narcotics, COPD on 2 L supplemental O2 via [place] at night, HTN, H and depression, presenting with weakness, poor oral intake and inability to care for self.  Urinalysis consistent with UTI    Inadequate oral intake -Multifactorial and secondary to generalized physical deconditioning, chronic pain, chronic narcotic therapy, UTI -Treat UTI -Physical therapy and nutrition evaluation  Urinary tract infection -IV  Rocephin  Unstageable sacral decubitus -Wound care    ESRD on hemodialysis [facility]) -Nephrology consult for continuation of dialysis TTS  Chronic hypotension -Continue midodrine    Atrial fibrillation ([place]) -Not currently on anticoagulation    Chronic diastolic heart failure (HCC) -Euvolemic    COPD with chronic bronchitis ([place]) -Not acutely exacerbated.  Continue home inhalers.  DuoNeb as needed    S/P CABG x 3 -No complaints of chest pain.  EKG no acute    Diabetes mellitus ([place]) -Sliding scale insulin coverage  Chronic pancreatitis -Continue pain  meds    Severe protein-calorie malnutrition ([place]) -Consider nutritionist consult    DVT prophylaxis: Heparin Code Status: full code  Family Communication:  none  Disposition Plan: Back to previous home environment Consults called: Nephrology Status: Observation    Athena Masse MD [facility]     04/20/2020, 3:01 AM

## 2020-04-20 NOTE — Consult Note (Signed)
WOC Nurse Consult Note: Reason for Consult: Right buttock lesion and right medial foot lesion Wound type: Suspected infection Pressure Injury POA: N/A Measurement: Approximately 4cm according to nephrology note.  Bedside RN to measure prior to first dressing placement. Wound bed: See photo taken today in EMR. Red, black in center with surrounding erythema. RN reports induration and warmth to right buttock lesion. Right medial foot wound is 80% red, 20% purple with purple tissue sloughing to side of lesion. Drainage (amount, consistency, odor) Dark brown exudate in a small amount Periwound: As noted above: buttock lesion has surrounding erythema. Dressing procedure/placement/frequency: I have provided guidance for Nursing for topical care using xeroform to the right foot with daily changes and a normal saline dressing to the right buttock lesion with twice daily changes.  Recommend consultation with Surgery for the right buttock lesion. If you agree, please order/arrange.  A mattress replacement with low air loss feature and  bilateral pressure redistribution heel boots have been provided for mitigation of pressure and moisture. Turning and repositioning is in place.  [place] nursing team will not follow, but will remain available to this patient, the nursing and medical teams.  Please re-consult if needed. Thanks, Maudie Flakes, MSN, RN, [place] [facility], [facility]  Pager# [phone]

## 2020-04-20 NOTE — ED Notes (Signed)
Lab called for morning labs.

## 2020-04-20 NOTE — Consult Note (Addendum)
[name] MRN: [id number] DOB/AGE: 1951/05/25 69 y.o. Primary Care Physician:Bouska, Shanon Brow, MD Admit date: 04/19/2020 Chief Complaint:  Chief Complaint  Patient presents with  . Weakness  . Pressure Ulcer   HPI: Patient is a 69 year old female with a past medical history of ESRD, patient is on hemodialysis on Tuesday Thursday Saturday schedule, coronary artery disease, s/p CABG, s/p mitral valve repair, atrial fibrillation, s/p Maze procedure done in 2012, patient is currently not on any anticoagulation, history of chronic diastolic CHF, history of peripheral arterial disease, s/p bilateral renal artery stents as well as left subclavian stent, abdominal aneurysm repair done in 2019, chronic pancreatitis on chronic narcotics, COPD on 2 L oxygen at night, hypertension, depression who came to the ER with chief complaint of weakness  History of present illness dates back to few days ago when patient started feeling weak it was generalized and progressive in nature. Patient complains of decreased oral intake from past few days. Patient also complains of wound on the right gluteal area.  Patient wound developed over the past few days as patient has been unable to get up.  Upon evaluation in the ER patient was found to have blood pressure of 95/63.  Patient UA also showed possible UTI patient was found to have a 4 cm wound to the right buttock and surrounding erythema.  Patient was admitted with chief complaint of inadequate oral intake, unstageable sacral decubitus and UTI  Patient was seen today in the ER Patient offers no complaint of fever cough or chills No complaint of nausea vomiting diarrhea No complaint of recent Covid contacts No complaint of hematuria Patient continues to complain of weakness and pain in her right buttock.  Patient daughter was present in the room.  Patient daughter main concern was  decreased p.o. intake Patient daughter other major concern was patient is on  dialysis on Tuesday Thursday Saturday schedule.  Patient missed her dialysis on Saturday as patient was here in the ER  Past Medical History:  Diagnosis Date  . AAA (abdominal aortic aneurysm) ([place]) 5/09   3.7x3.3 by u/s 2009  . Abnormal EKG    deep TW inversions chronic  . Anemia   . CAD (coronary artery disease)    a. CABG 03/2011 LIMA to LAD, SVG to OM, SVG to PDA. b. cath 06/25/2015 DES to SVG to rPDA, patent LIMA to LAD, patent SVG to OM  . carotid stenosis 5/09   S/p L CEA ;  60-79% bilat ICA by preCABG dopplers 7/12  . CHF (congestive heart failure) ([place])   . Chronic back pain    "all over" (06/24/2015)  . Chronic bronchitis ([place])    "get it pretty much q yr" (06/24/2015)  . CKD (chronic kidney disease)   . Complication of anesthesia 1966   "problem w/ether"  . COPD (chronic obstructive pulmonary disease) ([place])   . Depression   . Dysrhythmia   . GERD (gastroesophageal reflux disease)    With hiatal hernia  . GIB (gastrointestinal bleeding) 9/11   S/p EGD with cautery at [facility]  . Heart murmur   . History of blood transfusion "a few times"   "all related to anemia" (06/24/2015)  . History of hiatal hernia   . Hyperlipidemia   . Hypertension   . Mitral regurgitation    3+ MR by intraoperative TEE;  s/p MV repair with Dr. Roxy Manns 7/12  . Myocardial infarction [facility]) 2012 "several"  . On home oxygen therapy    "2 liters at  night; negative for sleep apnea"  . PAD (peripheral artery disease) ([facility])    Severe; s/p bilateral renal artery stents, moderate in-stent restenosis  . Pancreatitis 05/2018  . Paroxysmal atrial fibrillation ([facility])    coumadin;  echo 9/07: EF 60%, mild LVH;  s/p Cox Maze 7/12 with LAA clipping  . Subclavian artery stenosis, left ([facility])    stented by Dr. Trula Slade on 10/18 to help flow of her LIMA to LAD  . Type II diabetes mellitus ([facility])         Family History  Problem Relation Age of Onset  . Emphysema Mother   . Heart disease Mother        before age  77  . Hypertension Mother   . Hyperlipidemia Mother   . Heart attack Mother   . AAA (abdominal aortic aneurysm) Mother        rupture  . Heart attack Father 90  . Emphysema Father   . Heart disease Father        before age 33  . Hyperlipidemia Father   . Hypertension Father   . Peripheral vascular disease Father   . Diabetes Brother   . Heart disease Brother        before age 37  . Hyperlipidemia Brother   . Hypertension Brother   . Heart attack Brother        CABG  . Heart disease Other        Vascular disease in grandparents, uncles and dad  . Colon cancer Neg Hx   . Esophageal cancer Neg Hx   . Inflammatory bowel disease Neg Hx   . Liver disease Neg Hx   . Pancreatic cancer Neg Hx   . Rectal cancer Neg Hx   . Stomach cancer Neg Hx     Social History:  reports that she has been smoking cigarettes. She has a 37.50 pack-year smoking history. She has never used smokeless tobacco. She reports current alcohol use. She reports current drug use. Drug: Marijuana.   Allergies:  Allergies  Allergen Reactions  . Isosorbide Other (See Comments)    Can only tolerate in low doses (unknown reaction) Other reaction(s): Other Unknown "FELT LIKE I WAS GOING TO EXPLODE FROM INSIDE OUT"  . Codeine Itching  . Contrast Media [Iodinated Diagnostic Agents] Itching and Other (See Comments)    Itching of feet  . Ioxaglate Itching and Other (See Comments)    Itching of feet  . Isosorbide Nitrate Other (See Comments)    Unknown   . Metrizamide Itching and Other (See Comments)    Itching of feet  . Oxycodone-Acetaminophen Itching  . Percocet [Oxycodone-Acetaminophen] Itching and Other (See Comments)    Tolerates Hydrocodone    (Not in a hospital admission)      GLO:VFIEP from the symptoms mentioned above,there are no other symptoms referable to all systems reviewed.  Marland Kitchen allopurinol  100 mg Oral QHS  . atorvastatin  40 mg Oral QHS  . clopidogrel  75 mg Oral QHS  . dronabinol   2.5 mg Oral BID AC  . gabapentin  300 mg Oral QHS  . heparin  5,000 Units Subcutaneous Q8H  . HYDROmorphone  2 mg Oral Q4H  . insulin aspart  0-5 Units Subcutaneous QHS  . insulin aspart  0-6 Units Subcutaneous TID WC  . metoprolol tartrate  12.5 mg Oral BID  . Pancrelipase (Lip-Prot-Amyl)  1 capsule Oral TID  . pantoprazole  40 mg Oral Daily  . sertraline  50  mg Oral Daily  . traZODone  150 mg Oral QHS           Physical Exam: Vital signs in last 24 hours: Temp:  [98.1 F (36.7 C)-98.9 F (37.2 C)] 98.1 F (36.7 C) (08/15 0400) Pulse Rate:  [72-93] 93 (08/15 0400) Resp:  [18-22] 20 (08/15 0400) BP: (95-128)/(49-67) 128/67 (08/15 0400) SpO2:  [95 %-97 %] 96 % (08/15 0400) Weight:  [37.6 kg] 37.6 kg (08/14 1218) Weight change:     Intake/Output from previous day: No intake/output data recorded. No intake/output data recorded.   Physical Exam: General- pt is awake,alert, oriented to time place and person Resp- No acute REsp distress, CTA B/L NO Rhonchi CVS- S1S2 regular in rate and rhythm GIT- BS+, soft, NT, ND EXT- NO LE Edema, Cyanosis CNS- CN 2-12 grossly intact. Moving all 4 extremities Psych- normal mod and affect Access- AVF Skin-right buttock 4 cm sized circular decub with a black eschar with minimal erythema around the wound   Lab Results: CBC Recent Labs    04/19/20 1219  WBC 9.5  HGB 10.7*  HCT 31.4*  PLT 152    BMET Recent Labs    04/19/20 1219  NA 135  K 4.1  CL 99  CO2 26  GLUCOSE 96  BUN 27*  CREATININE 2.90*  CALCIUM 8.0*    MICRO Recent Results (from the past 240 hour(s))  SARS Coronavirus 2 by RT PCR (hospital order, performed in [facility] hospital lab) Nasopharyngeal Nasopharyngeal Swab     Status: None   Collection Time: 04/20/20  1:46 AM   Specimen: Nasopharyngeal Swab  Result Value Ref Range Status   SARS Coronavirus 2 NEGATIVE NEGATIVE Final    Comment: (NOTE) SARS-CoV-2 target nucleic acids are NOT  DETECTED.  The SARS-CoV-2 RNA is generally detectable in upper and lower respiratory specimens during the acute phase of infection. The lowest concentration of SARS-CoV-2 viral copies this assay can detect is 250 copies / mL. A negative result does not preclude SARS-CoV-2 infection and should not be used as the sole basis for treatment or other patient management decisions.  A negative result may occur with improper specimen collection / handling, submission of specimen other than nasopharyngeal swab, presence of viral mutation(s) within the areas targeted by this assay, and inadequate number of viral copies (<250 copies / mL). A negative result must be combined with clinical observations, patient history, and epidemiological information.  Fact Sheet for Patients:   StrictlyIdeas.no  Fact Sheet for Healthcare Providers: BankingDealers.co.za  This test is not yet approved or  cleared by the [place] FDA and has been authorized for detection and/or diagnosis of SARS-CoV-2 by FDA under an Emergency Use Authorization (EUA).  This EUA will remain in effect (meaning this test can be used) for the duration of the COVID-19 declaration under Section 564(b)(1) of the Act, 21 U.S.C. section 360bbb-3(b)(1), unless the authorization is terminated or revoked sooner.  Performed at [facility], [address], [place], [place] [address]       Lab Results  Component Value Date   PTH 80 (H) 08/15/2018   CALCIUM 8.0 (L) 04/19/2020   CAION 1.10 (L) 03/21/2018   PHOS 2.1 (L) 10/25/2019      Impression: Pt is a 69 y.o. Caucasian   female with  medical problems of ESRD, on hemodialysis, CAD/CABG 03/2011, COPD, atrial fibrillation, hypertension, COPD, chronic pancreatitis and chronic abdominal pain requiring recurrent hospitalizations , h/o AAA, cholecystectomy, severe peripheral arterial disease including  bilateral renal artery stent,  subclavian artery stenosis, left, left CEA was admitted on April 19, 2020 with chief complaint of Inadequate oral intake Generalized weakness Unstageable sacral decub UTI  1)Renal End-stage renal disease-patient is on hemodialysis on Tuesday Thursday Saturday schedule Patient goes to dialysis under [facility] schedule group at [facility] located on [address] through left arm fistula Patient was not  dialyzed yesterday.  There is no acute need for renal replacement therapy today We will follow patient closely and decide Monday whether she needs dialysis on Monday or on Tuesday   2)HTN Blood pressure is now stable  3)Anemia of chronic disease  HGb at goal (9--11) We will keep patient on Epogen during dialysis  4) secondary hyperparathyroidism-CKD Mineral-Bone Disorder Secondary hyperparathyroidism-present  Patient phosphorus is on the lower side We will check patient phosphorus  5) urinary tract infection Patient is on IV antibiotics   6) electrolytes Normokalemic NOrmonatremic   7)Acid base Co2 at goal  8) unstageable sacral decub Wound care has been consulted  9)Inadequate oral intake This is secondary to multiple factors Primary team is following. Dietary has been consulted   Plan:  Agree with the current treatment plan Agree with the wound care consult No need for renal replacement therapy today We will keep patient on Epogen during dialysis   [name] s [facility] 04/20/2020, 8:17 AM

## 2020-04-20 NOTE — Consult Note (Signed)
[facility] SURGICAL CONSULTATION NOTE (initial) - cpt: [id number])   HISTORY OF PRESENT ILLNESS (HPI):  69 y.o. female presented to [facility] ED yesterday for evaluation of the complex of symptoms noted below. Patient reports having pain in her right buttock wound secondary to having to lay on that wound due to her pancreatic pain.  Now she complains of pain at the ischial decubitus site.  Briefly this is a 69 year old female with extensive past medical history of ESRD on hemodialysis TTS, CAD status post CABG, status post mitral valve repair, atrial fibrillation on anticoagulation, chronic diastolic congestive heart failure, PAD, bilateral renal artery stenosis, severe protein calorie malnutrition, chronic pancreatitis, chronic narcotic use, COPD, hypertension, depression.  She presented to the emergency department yesterday with weakness, generalized pain, acute on chronic pain of the right buttock ulcer.  The patient's been followed by wound care clinic.  However the wound had worsened and pain worsened to the point that the patient presented to the emergency department.  She is also markedly malnourished weight has dropped to 37.6 kg.  BMI 13.8.  Surgery is consulted by hospitalist physician Dr. Priscella Mann in this context for evaluation and management of her decubitus, following the [place] nurses evaluation.  PAST MEDICAL HISTORY (PMH):  Past Medical History:  Diagnosis Date  . AAA (abdominal aortic aneurysm) ([place]) 5/09   3.7x3.3 by u/s 2009  . Abnormal EKG    deep TW inversions chronic  . Anemia   . CAD (coronary artery disease)    a. CABG 03/2011 LIMA to LAD, SVG to OM, SVG to PDA. b. cath 06/25/2015 DES to SVG to rPDA, patent LIMA to LAD, patent SVG to OM  . carotid stenosis 5/09   S/p L CEA ;  60-79% bilat ICA by preCABG dopplers 7/12  . CHF (congestive heart failure) ([place])   . Chronic back pain    "all over" (06/24/2015)  . Chronic bronchitis ([place])    "get it pretty much q  yr" (06/24/2015)  . CKD (chronic kidney disease)   . Complication of anesthesia 1966   "problem w/ether"  . COPD (chronic obstructive pulmonary disease) ([place])   . Depression   . Dysrhythmia   . GERD (gastroesophageal reflux disease)    With hiatal hernia  . GIB (gastrointestinal bleeding) 9/11   S/p EGD with cautery at [facility]  . Heart murmur   . History of blood transfusion "a few times"   "all related to anemia" (06/24/2015)  . History of hiatal hernia   . Hyperlipidemia   . Hypertension   . Mitral regurgitation    3+ MR by intraoperative TEE;  s/p MV repair with Dr. Roxy Manns 7/12  . Myocardial infarction [facility]) 2012 "several"  . On home oxygen therapy    "2 liters at night; negative for sleep apnea"  . PAD (peripheral artery disease) (HCC)    Severe; s/p bilateral renal artery stents, moderate in-stent restenosis  . Pancreatitis 05/2018  . Paroxysmal atrial fibrillation (HCC)    coumadin;  echo 9/07: EF 60%, mild LVH;  s/p Cox Maze 7/12 with LAA clipping  . Subclavian artery stenosis, left (HCC)    stented by Dr. Trula Slade on 10/18 to help flow of her LIMA to LAD  . Type II diabetes mellitus ([place])      PAST SURGICAL HISTORY ([place]):  Past Surgical History:  Procedure Laterality Date  . A/V FISTULAGRAM Left 03/21/2018   Procedure: A/V FISTULAGRAM;  Surgeon: Serafina Mitchell, MD;  Location: [place] [facility];  Service: Cardiovascular;  Laterality: Left;  . ABDOMINAL AORTIC ENDOVASCULAR STENT GRAFT N/A 01/17/2018   Procedure: ABDOMINAL AORTIC ENDOVASCULAR STENT GRAFT WITH CO2;  Surgeon: Serafina Mitchell, MD;  Location: [place];  Service: Vascular;  Laterality: N/A;  . ABDOMINAL HYSTERECTOMY  1990's  . APPENDECTOMY  Aug. 11, 2016   Ruptured  . AV FISTULA PLACEMENT Left 11/03/2017   Procedure: ARTERIOVENOUS (AV) FISTULA CREATION LEFT ARM;  Surgeon: Serafina Mitchell, MD;  Location: [place];  Service: Vascular;  Laterality: Left;  . BOWEL RESECTION  2013  . CARDIAC CATHETERIZATION N/A 06/24/2015    Procedure: Left Heart Cath and Cors/Grafts Angiography;  Surgeon: Leonie Man, MD;  Location: [place] [facility];  Service: Cardiovascular;  Laterality: N/A;  . CARDIAC CATHETERIZATION N/A 06/25/2015   Procedure: Coronary Stent Intervention;  Surgeon: Sherren Mocha, MD;  Location: [place] [facility];  Service: Cardiovascular;  Laterality: N/A;  . CAROTID ENDARTERECTOMY Left   . CATARACT EXTRACTION Right 03/27/2018  . CHOLECYSTECTOMY N/A 04/19/2018   Procedure: LAPAROSCOPIC CHOLECYSTECTOMY WITH INTRAOPERATIVE CHOLANGIOGRAM;  Surgeon: Rolm Bookbinder, MD;  Location: [place];  Service: General;  Laterality: N/A;  . CORONARY ANGIOPLASTY    . CORONARY ARTERY BYPASS GRAFT  03/05/2011   CABG X3 (LIMA to LAD, SVG to OM, SVG to PDA, EVH via left thigh  . CORONARY STENT PLACEMENT    . ESOPHAGOGASTRODUODENOSCOPY N/A 04/12/2018   Procedure: ESOPHAGOGASTRODUODENOSCOPY (EGD);  Surgeon: [name], MD;  [facility] END[facility]COPY;  Service: Endoscopy;  Laterality: N/A;  . ESOPHAGOGASTRODUODENOSCOPY N/A 06/16/2018   Procedure: ESOPHAGOGASTRODUODENOSCOPY (EGD);  Surgeon: Thornton Park, MD;  Location: [place];  Service: Gastroenterology;  Laterality: N/A;  . IR AV DIALY SHUNT INTRO NEEDLE/INTRACATH INITIAL W/PTA/IMG LEFT  01/23/2020  . IR RADIOLOGIST EVAL & MGMT  09/13/2019  . IR RADIOLOGIST EVAL & MGMT  10/31/2019  . IR RADIOLOGIST EVAL & MGMT  11/14/2019  . IR RADIOLOGIST EVAL & MGMT  01/02/2020  . IR US GUIDE VASC ACCESS LEFT  01/23/2020  . LACERATION REPAIR Right 1990's   WRIST  . MAZE  03/05/2011   complete biatrial lesion set with clipping of LA appendage  . MITRAL VALVE REPAIR  03/05/2011   68mm Memo 3D ring annuloplasty for ischemic MR  . PERIPHERAL VASCULAR BALLOON ANGIOPLASTY Left 03/21/2018   Procedure: PERIPHERAL VASCULAR BALLOON ANGIOPLASTY;  Surgeon: Serafina Mitchell, MD;  Location: [place] [facility];  Service: Cardiovascular;  Laterality: Left;  . PERIPHERAL VASCULAR CATHETERIZATION  N/A 06/24/2015   Procedure: Aortic Arch Angiography;  Surgeon: Serafina Mitchell, MD;  Location: [place] [facility];  Service: Cardiovascular;  Laterality: N/A;  . PERIPHERAL VASCULAR CATHETERIZATION  06/24/2015   Procedure: Peripheral Vascular Intervention;  Surgeon: Serafina Mitchell, MD;  Location: [place] [facility];  Service: Cardiovascular;;  . PERIPHERAL VASCULAR CATHETERIZATION N/A 06/24/2015   Procedure: Abdominal Aortogram;  Surgeon: Serafina Mitchell, MD;  Location: [place] [facility];  Service: Cardiovascular;  Laterality: N/A;  . RENAL ANGIOGRAM Bilateral 09/11/2013   Procedure: RENAL ANGIOGRAM;  Surgeon: Serafina Mitchell, MD;  Location: [facility] CATH LAB;  Service: Cardiovascular;  Laterality: Bilateral;  . RIGHT FEMORAL-POPLITEAL BYPASS       MEDICATIONS:  Prior to Admission medications   Medication Sig Start Date End Date Taking? Authorizing Provider  albuterol (VENTOLIN HFA) 108 (90 Base) MCG/ACT inhaler Inhale 1-2 puffs into the lungs every 6 (six) hours as needed for wheezing or shortness of breath.   Yes [provider]  allopurinol (  ZYLOPRIM) 100 MG tablet Take 100 mg by mouth at bedtime.  02/02/17  Yes [provider]  atorvastatin (LIPITOR) 40 MG tablet Take 40 mg by mouth at bedtime.  12/07/17  Yes [provider]  B Complex-C-Zn-Folic Acid (DIALYVITE 937-DSKA 15) 0.8 MG TABS Take 1 tablet by mouth daily. 03/31/20  Yes [provider]  clopidogrel (PLAVIX) 75 MG tablet Take 75 mg by mouth at bedtime.  10/02/19  Yes [provider]  dronabinol (MARINOL) 2.5 MG capsule Take 2.5 mg by mouth 2 (two) times daily before a meal.   Yes [provider]  gabapentin (NEURONTIN) 300 MG capsule Take 1 capsule (300 mg total) by mouth at bedtime. 08/22/18  Yes Mikhail, Maryann, DO  HYDROmorphone (DILAUDID) 2 MG tablet Take 2-4 mg by mouth every 4 (four) hours.    Yes [provider]  lidocaine-prilocaine (EMLA) cream Apply 1 application  topically 3 (three) times a week. 03/31/20  Yes [provider]  metoprolol tartrate (LOPRESSOR) 25 MG tablet Take 0.5 tablets (12.5 mg total) by mouth 2 (two) times daily. Do not take in the mornings when you have dialysis. 02/22/20 02/16/21 Yes Sherren Mocha, MD  midodrine (PROAMATINE) 10 MG tablet TAKE 1 TABLET BY MOUTH AS DIRECTED BEFORE HEMODIALYSIS. MAY REPEAT 1 TIME 03/20/20  Yes [provider]  nitroGLYCERIN (NITROSTAT) 0.4 MG SL tablet Place 1 tablet (0.4 mg total) under the tongue every 5 (five) minutes as needed for chest pain. 02/22/20  Yes Sherren Mocha, MD  OXYGEN Inhale 2 L into the lungs at bedtime.    Yes [provider]  Pancrelipase, Lip-Prot-Amyl, (ZENPEP) 20000-63000 units CPEP Take 1 capsule by mouth 3 (three) times daily. 01/24/20  Yes Lucious Groves, DO  pantoprazole (PROTONIX) 40 MG tablet Take 1 tablet (40 mg total) by mouth daily. 08/03/18  Yes Patrecia Pour, MD  promethazine (PHENERGAN) 12.5 MG tablet Take 12.5 mg by mouth every 4 (four) hours as needed for nausea or vomiting.   Yes [provider]  sertraline (ZOLOFT) 50 MG tablet Take 50 mg by mouth daily. 04/02/20  Yes [provider]  traZODone (DESYREL) 100 MG tablet Take 150 mg by mouth at bedtime.  04/19/19  Yes [provider]  diphenhydrAMINE (BENADRYL) 25 MG tablet Take 50 mg by mouth at bedtime. Patient not taking: Reported on 04/20/2020    [provider]     ALLERGIES:  Allergies  Allergen Reactions  . Isosorbide Other (See Comments)    Can only tolerate in low doses (unknown reaction) Other reaction(s): Other Unknown "FELT LIKE I WAS GOING TO EXPLODE FROM INSIDE OUT"  . Codeine Itching  . Contrast Media [Iodinated Diagnostic Agents] Itching and Other (See Comments)    Itching of feet  . Ioxaglate Itching and Other (See Comments)    Itching of feet  . Isosorbide Nitrate Other (See Comments)    Unknown   . Metrizamide Itching and Other  (See Comments)    Itching of feet  . Oxycodone-Acetaminophen Itching  . Percocet [Oxycodone-Acetaminophen] Itching and Other (See Comments)    Tolerates Hydrocodone     SOCIAL HISTORY:  Social History   Socioeconomic History  . Marital status: Widowed    Spouse name: Not on file  . Number of children: Not on file  . Years of education: Not on file  . Highest education level: Not on file  Occupational History  . Occupation: Scientist, water quality in past    Employer: DISABLED  Tobacco Use  .  Smoking status: Current Every Day Smoker    Packs/day: 0.75    Years: 50.00    Pack years: 37.50    Types: Cigarettes  . Smokeless tobacco: Never Used  . Tobacco comment: 3/4 pk per day  Vaping Use  . Vaping Use: Never used  Substance and Sexual Activity  . Alcohol use: Yes    Alcohol/week: 0.0 standard drinks  . Drug use: Yes    Types: Marijuana  . Sexual activity: Not Currently    Birth control/protection: None  Other Topics Concern  . Not on file  Social History Narrative   Widowed in 2009.   Social Determinants of Health   Financial Resource Strain:   . Difficulty of Paying Living Expenses:   Food Insecurity:   . Worried About Charity fundraiser in the Last Year:   . Arboriculturist in the Last Year:   Transportation Needs:   . Film/video editor (Medical):   Marland Kitchen Lack of Transportation (Non-Medical):   Physical Activity:   . Days of Exercise per Week:   . Minutes of Exercise per Session:   Stress:   . Feeling of Stress :   Social Connections:   . Frequency of Communication with Friends and Family:   . Frequency of Social Gatherings with Friends and Family:   . Attends Religious Services:   . Active Member of Clubs or Organizations:   . Attends Archivist Meetings:   Marland Kitchen Marital Status:   Intimate Partner Violence:   . Fear of Current or Ex-Partner:   . Emotionally Abused:   Marland Kitchen Physically Abused:   . Sexually Abused:      FAMILY HISTORY:  Family History    Problem Relation Age of Onset  . Emphysema Mother   . Heart disease Mother        before age 35  . Hypertension Mother   . Hyperlipidemia Mother   . Heart attack Mother   . AAA (abdominal aortic aneurysm) Mother        rupture  . Heart attack Father 34  . Emphysema Father   . Heart disease Father        before age 56  . Hyperlipidemia Father   . Hypertension Father   . Peripheral vascular disease Father   . Diabetes Brother   . Heart disease Brother        before age 63  . Hyperlipidemia Brother   . Hypertension Brother   . Heart attack Brother        CABG  . Heart disease Other        Vascular disease in grandparents, uncles and dad  . Colon cancer Neg Hx   . Esophageal cancer Neg Hx   . Inflammatory bowel disease Neg Hx   . Liver disease Neg Hx   . Pancreatic cancer Neg Hx   . Rectal cancer Neg Hx   . Stomach cancer Neg Hx       REVIEW OF SYSTEMS:  ROS  Review of Systems: As per HPI otherwise all other systems on review of systems negative.    VITAL SIGNS:  Temp:  [98.1 F (36.7 C)-98.9 F (37.2 C)] 98.1 F (36.7 C) (08/15 0400) Pulse Rate:  [77-93] 93 (08/15 0400) Resp:  [19-22] 20 (08/15 0400) BP: (114-128)/(61-67) 128/67 (08/15 0400) SpO2:  [95 %-97 %] 96 % (08/15 0400)     Height: 5\' 5"  (165.1 cm) Weight: 37.6 kg BMI (Calculated): 13.81   INTAKE/OUTPUT:  No intake/output data recorded.  PHYSICAL EXAM:  Physical Exam Blood pressure 128/67, pulse 93, temperature 98.1 F (36.7 C), temperature source Oral, resp. rate 20, height 5\' 5"  (1.651 m), weight 37.6 kg, SpO2 96 %. Last Weight  Most recent update: 04/19/2020 12:19 PM   Weight  37.6 kg (83 lb)            CONSTITUTIONAL: Thin, frail and poorly nourished, appropriately responsive and aware without distress.  Complains of pain and wanting more Dilaudid, which she reports she takes chronically for her chronic pancreatitis pain EYES: Sclera non-icteric.   EARS, NOSE, MOUTH AND THROAT: Oral mucosa  is pink and moist.  Hearing is intact to voice.  NECK: Trachea is midline, and there is no jugular venous distension.  LYMPH NODES:  Lymph nodes in the neck are not appreciable. RESPIRATORY:  Lungs are clear, and breath sounds are equal bilaterally. Normal respiratory effort without pathologic use of accessory muscles. CARDIOVASCULAR: Heart is regular in rate  GI: The abdomen is soft, nontender, and nondistended.  MUSCULOSKELETAL:  Symmetrical muscle tone noted.    SKIN: Skin turgor is normal. No pathologic skin lesions appreciated.  She has a right ischial decubitus with mobility over the underlying soft tissues suggestive of liquefactive underlying necrosis.  There is approximately a 3 cm x 2.5 cm area of demarcated eschar.  Surrounding hyperemia/pinkness is far less concerning for cellulitis.  Do not appreciate any malodorous discharge.  Right malleolus has a small decubitus stage II, and there is a healing pressure wound on the lateral aspect of her femur more distally, also of stage I-II.  Neither of these areas show significant necrosis, nor erythematous changes suspicious for cellulitis.       PSYCH:  Alert and oriented to person, place and time. Affect is appropriate for situation.  Data Reviewed I have personally reviewed what is currently available of the patient's imaging, recent labs and medical records.    Labs:  CBC Latest Ref Rng & Units 04/19/2020 02/06/2020 01/21/2020  WBC 4.0 - 10.5 K/uL 9.5 9.3 6.1  Hemoglobin 12.0 - 15.0 g/dL 10.7(L) 12.3 10.5(L)  Hematocrit 36 - 46 % 31.4(L) 36.8 32.0(L)  Platelets 150 - 400 K/uL 152 141.0(L) 183   CMP Latest Ref Rng & Units 04/19/2020 02/06/2020 01/21/2020  Glucose 70 - 99 mg/dL 96 137(H) 146(H)  BUN 8 - 23 mg/dL 27(H) 15 34(H)  Creatinine 0.44 - 1.00 mg/dL 2.90(H) 2.10(H) 2.81(H)  Sodium 135 - 145 mmol/L 135 139 139  Potassium 3.5 - 5.1 mmol/L 4.1 3.1(L) 3.4(L)  Chloride 98 - 111 mmol/L 99 98 103  CO2 22 - 32 mmol/L 26 33(H) 24    Calcium 8.9 - 10.3 mg/dL 8.0(L) 8.2(L) 8.0(L)  Total Protein 6.5 - 8.1 g/dL - - 4.8(L)  Total Bilirubin 0.3 - 1.2 mg/dL - - 0.8  Alkaline Phos 38 - 126 U/L - - 216(H)  AST 15 - 41 U/L - - 27  ALT 0 - 44 U/L - - 21     Imaging studies:   Last 24 hrs: CT PELVIS WO CONTRAST  Result Date: 04/19/2020 CLINICAL DATA:  Right buttocks ulcer EXAM: CT PELVIS WITHOUT CONTRAST TECHNIQUE: Multidetector CT imaging of the pelvis was performed following the standard protocol without intravenous contrast. COMPARISON:  None. FINDINGS: Urinary Tract: The bladder is mildly distended and fluid-filled. Bowel: There is a moderate amount of rectal colonic stool present. An anastomosis seen within the rectum. Scattered colonic diverticula are noted. There is mild presacral edema.  Vascular/Lymphatic: No enlarged pelvic lymph nodes identified. Again noted is a partially visualized aorto bi-iliac stent graft. Scattered aortic atherosclerotic calcifications are seen without aneurysmal dilatation. Reproductive: The patient is status post hysterectomy. No adnexal masses or collections seen. Other: Along the right posterior coccygeal soft tissues there is a superficial ulceration and heterogeneous skin thickening with non loculated fluid. No subcutaneous emphysema seen. No areas of cortical destruction or periosteal reaction. There is diffuse anasarca noted. Musculoskeletal: No acute or significant osseous findings. IMPRESSION: Superficial ulceration overlying the right inferior coccyx. There is skin thickening with heterogeneous non loculated fluid/phlegmon. No loculated fluid collection, subcutaneous emphysema or definite evidence of osteomyelitis. Aortic Atherosclerosis (ICD10-I70.0). Aorto bi-iliac stent graft Electronically Signed   By: Prudencio Pair M.D.   On: 04/19/2020 23:17   Procedure: With informed consent, I prepped the right ischial decubitus with Betadine.  I utilized a 15 blade to excise the well demarcated eschar  from the underlying soft tissues.  I found 2 additional layers of liquefactive tissue to remove before I reached a readily viable ulcer base.  I felt this was sufficient under the circumstances and tolerable by the patient to initiate reasonable wound care.  See photos noted above.  The area of open wound after excision is 3 cm x 2.5 cm x 8 mm.  Assessment/Plan:  69 y.o. female with right ischial, right malleoli are and right femoral decubiti of varying stages, the worst is the right ischial region, complicated by pertinent comorbidities including those listed below.  Patient Active Problem List   Diagnosis Date Noted  . Inadequate oral intake 04/20/2020  . UTI (urinary tract infection) 04/20/2020  . Unintentional weight loss 02/06/2020  . Generalized abdominal pain 02/06/2020  . Idiopathic chronic pancreatitis ([place]) 02/06/2020  . AV fistula occlusion ([place]) 01/22/2020  . AV fistula thrombosis, initial encounter ([place]) 01/19/2020  . AF (paroxysmal atrial fibrillation) ([place]) 01/19/2020  . Chronic respiratory failure with hypoxia ([place]) 01/19/2020  . Acute pancreatitis 07/26/2019  . Acute on chronic pancreatitis ([place]) 05/12/2019  . Abnormal CT of the abdomen 09/25/2018  . Pancreatic divisum 09/25/2018  . Pancreatic mass   . ESRD on hemodialysis ([place]) 09/09/2018  . Renal failure (ARF), acute on chronic (HCC) 08/15/2018  . ARF (acute renal failure) ([place]) 08/15/2018  . CKD (chronic kidney disease), stage V ([place]) 08/15/2018  . Hypomagnesemia 08/15/2018  . Acute pulmonary edema ([place]) 08/14/2018  . Goals of care, counseling/discussion   . DNR (do not resuscitate) discussion   . AKI (acute kidney injury) ([place]) 07/26/2018  . Acute encephalopathy 07/24/2018  . Chronic pain 07/24/2018  . Malnutrition of moderate degree 07/13/2018  . Stage II pressure ulcer ([place]) 07/12/2018  . Fluid collection at surgical site   . Intraabdominal fluid collection   . Cystic duct calculus   . RUQ pain 07/08/2018    . Peripancreatic fluid collection 07/04/2018  . 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 Urinary retention 07/04/2018  . S/P insertion of iliac artery stent 07/04/2018  . Endoleak post (EVAR) endovascular aneurysm repair ([place]) 07/04/2018  . Physical deconditioning   . Esophageal ulcer with bleeding   . Pressure injury of skin 06/16/2018  . Hematemesis   . Palliative care patient   . Palliative care by specialist   . Anasarca 06/07/2018  . Demand ischemia ([place]) 06/07/2018  . Severe protein-calorie malnutrition ([place]) 05/29/2018  . Type II diabetes mellitus with renal manifestations ([place]) 05/28/2018  . Recurrent pancreatitis 05/28/2018  . Hypokalemia 05/28/2018  . SIRS (systemic inflammatory response  syndrome) ([place]) 05/28/2018  . Acute recurrent pancreatitis 05/28/2018  . Volume overload 04/17/2018  . Anemia due to GI blood loss 04/14/2018  . Acute gallstone pancreatitis 04/14/2018  . Acute cholecystitis 04/14/2018  . Gastrointestinal hemorrhage   . Fatigue associated with anemia 04/09/2018  . Hypoglycemia 01/19/2018  . Type II diabetes mellitus ([place]) 01/19/2018  . Nausea with vomiting 01/19/2018  . Uncontrolled hypertension 01/19/2018  . Hyperlipidemia 01/19/2018  . CAD (coronary artery disease) 01/19/2018  . Chronic kidney disease (CKD), stage IV (severe) ([place]) 01/19/2018  . Retroperitoneal hematoma 01/19/2018  . Acute blood loss anemia 01/19/2018  . Chronic diastolic congestive heart failure ([place]) 01/19/2018  . AAA (abdominal aortic aneurysm) ([place]) 01/16/2018  . Elevated brain natriuretic peptide (BNP) level 10/07/2015  . Diabetes mellitus ([place]) 10/07/2015  . Pain in the chest   . PAD (peripheral artery disease) ([place]) 07/29/2015  . Chest pain 07/04/2015  . Acute renal failure superimposed on stage 4 chronic kidney disease ([place]) 07/04/2015  . GERD (gastroesophageal reflux disease) 07/04/2015  . Anemia 06/26/2015  . Coronary artery disease involving autologous vein coronary  bypass graft with unstable angina pectoris ([place]) 06/24/2015  . Angina at rest [facility])   . Subclavian artery stenosis, left ([place]) 06/23/2015  . AAA (abdominal aortic aneurysm) without rupture ([place]) 12/10/2014  . Subclavian steal syndrome 12/10/2014  . Renal artery stenosis ([place]) 09/04/2013  . Carotid artery stenosis 09/28/2012  . Mitral valve disorder 06/23/2011  . S/P CABG x 3 06/16/2011  . S/P Maze operation for atrial fibrillation 06/16/2011  . S/P MVR (mitral valve repair) 06/16/2011  . Follow-up examination following surgery 03/29/2011  . CKD (chronic kidney disease) 03/03/2011  . COPD with chronic bronchitis ([place]) 03/03/2011  . Chronic diastolic heart failure ([place]) 02/26/2011  . Hypotension 02/26/2011  . NSTEMI (non-ST elevated myocardial infarction) ([place]) 02/26/2011  . Long term (current) use of anticoagulants 12/17/2010  . OBSTRUCTIVE SLEEP APNEA 09/04/2010  . HYPOXEMIA 09/04/2010  . SNORING 08/05/2010  . Atrial fibrillation ([place]) 12/09/2009  . WEIGHT LOSS 03/28/2009  . PALPITATIONS 03/28/2009  . PVD 11/21/2008  . HYPERLIPIDEMIA-MIXED 11/20/2008  . TOBACCO ABUSE 11/20/2008  . Essential hypertension 11/20/2008  . Coronary artery disease involving native heart with angina pectoris ([place]) 11/20/2008  . ABDOMINAL AORTIC ANEURYSM 11/20/2008    -Would initiate Santyl dressing changes as ordered twice daily.  -Offload per wound care nurses instructions.  -Arrange follow-up at the wound care center.    All of the above findings and recommendations were discussed with the patient and all of patient's  questions were answered to her expressed satisfaction.  Thank you for the opportunity to participate in this patient's care.   -- Ronny Bacon, M.D., FACS 04/20/2020, 4:00 PM

## 2020-04-20 NOTE — ED Notes (Signed)
Pt escorted to [facility] by EDT Jann.

## 2020-04-20 NOTE — Telephone Encounter (Signed)
I am sorry to hear that her weight has continued to decrease. Although feeding tube has issues, it may be an avenue for which nutrition can be optimized. The question is is there an endoscopic issue that needs to also be removed from the equation. I do not know when her last colonoscopy was and if she has not had one in a while, then certainly colonoscopy to rule out etiologies for weight loss unintentionally needs to be considered. Referral for percutaneous tube placement to radiology is okay from my standpoint however should they still want that. Keep me up-to-date. Thanks. GM

## 2020-04-20 NOTE — Progress Notes (Addendum)
Brief hospitalist update note.  Please see scanned H&P for full billable details.  Briefly this is a 69 year old female with extensive past medical history of ESRD on hemodialysis TTS, CAD status post CABG, status post mitral valve repair, atrial fibrillation on anticoagulation, chronic diastolic congestive heart failure, PAD, bilateral renal artery stenosis, severe protein calorie malnutrition, chronic pancreatitis, chronic narcotic use, COPD, hypertension, depression.  She presented to the emergency department yesterday with weakness, generalized pain, acute on chronic pain of the right buttock ulcer.  The patient's been followed by wound care clinic.  However the wound had worsened and pain worsened to the point that the patient presented to the emergency department.  She is also markedly malnourished weight has dropped to 37.6 kg.  BMI 13.8.  Daughter was at bedside this morning.  Shared some of her concerns.   Plan: Surgery consult, [place] WOC consult Nutrition consult DC Rocephin Start Unasyn Nephrology consult, [place] Kick of plan per HPI  Ralene Muskrat MD

## 2020-04-21 ENCOUNTER — Telehealth: Payer: Self-pay | Admitting: Primary Care

## 2020-04-21 ENCOUNTER — Other Ambulatory Visit: Payer: Medicare Other | Admitting: Primary Care

## 2020-04-21 LAB — CBC WITH DIFFERENTIAL/PLATELET
Abs Immature Granulocytes: 0.08 10*3/uL — ABNORMAL HIGH (ref 0.00–0.07)
Basophils Absolute: 0 10*3/uL (ref 0.0–0.1)
Basophils Relative: 0 %
Eosinophils Absolute: 0 10*3/uL (ref 0.0–0.5)
Eosinophils Relative: 1 %
HCT: 28 % — ABNORMAL LOW (ref 36.0–46.0)
Hemoglobin: 10.1 g/dL — ABNORMAL LOW (ref 12.0–15.0)
Immature Granulocytes: 1 %
Lymphocytes Relative: 8 %
Lymphs Abs: 0.7 10*3/uL (ref 0.7–4.0)
MCH: 35.6 pg — ABNORMAL HIGH (ref 26.0–34.0)
MCHC: 36.1 g/dL — ABNORMAL HIGH (ref 30.0–36.0)
MCV: 98.6 fL (ref 80.0–100.0)
Monocytes Absolute: 0.6 10*3/uL (ref 0.1–1.0)
Monocytes Relative: 8 %
Neutro Abs: 6.8 10*3/uL (ref 1.7–7.7)
Neutrophils Relative %: 82 %
Platelets: 152 10*3/uL (ref 150–400)
RBC: 2.84 MIL/uL — ABNORMAL LOW (ref 3.87–5.11)
RDW: 14.8 % (ref 11.5–15.5)
WBC: 8.2 10*3/uL (ref 4.0–10.5)
nRBC: 0 % (ref 0.0–0.2)

## 2020-04-21 LAB — BASIC METABOLIC PANEL
Anion gap: 14 (ref 5–15)
BUN: 33 mg/dL — ABNORMAL HIGH (ref 8–23)
CO2: 23 mmol/L (ref 22–32)
Calcium: 7.6 mg/dL — ABNORMAL LOW (ref 8.9–10.3)
Chloride: 100 mmol/L (ref 98–111)
Creatinine, Ser: 3.46 mg/dL — ABNORMAL HIGH (ref 0.44–1.00)
GFR calc Af Amer: 15 mL/min — ABNORMAL LOW (ref >60)
GFR calc non Af Amer: 13 mL/min — ABNORMAL LOW (ref >60)
Glucose, Bld: 113 mg/dL — ABNORMAL HIGH (ref 70–99)
Potassium: 3.4 mmol/L — ABNORMAL LOW (ref 3.5–5.1)
Sodium: 137 mmol/L (ref 135–145)

## 2020-04-21 LAB — PROCALCITONIN: Procalcitonin: 4.66 ng/mL

## 2020-04-21 LAB — GLUCOSE, CAPILLARY
Glucose-Capillary: 100 mg/dL — ABNORMAL HIGH (ref 70–99)
Glucose-Capillary: 109 mg/dL — ABNORMAL HIGH (ref 70–99)
Glucose-Capillary: 109 mg/dL — ABNORMAL HIGH (ref 70–99)
Glucose-Capillary: 95 mg/dL (ref 70–99)

## 2020-04-21 LAB — MAGNESIUM: Magnesium: 1.8 mg/dL (ref 1.7–2.4)

## 2020-04-21 MED ORDER — RENA-VITE PO TABS
1.0000 | ORAL_TABLET | Freq: Every day | ORAL | Status: DC
  Administered 2020-04-21 – 2020-05-01 (×11): 1 via ORAL
  Filled 2020-04-21 (×11): qty 1

## 2020-04-21 MED ORDER — HYDROMORPHONE HCL 2 MG PO TABS
4.0000 mg | ORAL_TABLET | ORAL | Status: DC | PRN
  Administered 2020-04-21 – 2020-04-22 (×5): 4 mg via ORAL
  Filled 2020-04-21 (×6): qty 2

## 2020-04-21 MED ORDER — EPOETIN ALFA 4000 UNIT/ML IJ SOLN
4000.0000 [IU] | INTRAMUSCULAR | Status: DC
  Administered 2020-04-22 – 2020-05-03 (×5): 4000 [IU] via INTRAVENOUS
  Filled 2020-04-21 (×6): qty 1

## 2020-04-21 MED ORDER — CHLORHEXIDINE GLUCONATE CLOTH 2 % EX PADS: 6.0000 | MEDICATED_PAD | Freq: Every day | CUTANEOUS | Status: DC

## 2020-04-21 MED ORDER — NICOTINE 21 MG/24HR TD PT24
21.0000 mg | MEDICATED_PATCH | Freq: Every day | TRANSDERMAL | Status: DC
  Administered 2020-04-21 – 2020-05-04 (×12): 21 mg via TRANSDERMAL
  Filled 2020-04-21 (×13): qty 1

## 2020-04-21 MED ORDER — OCUVITE-LUTEIN PO CAPS
1.0000 | ORAL_CAPSULE | Freq: Every day | ORAL | Status: DC
  Administered 2020-04-22 – 2020-05-01 (×7): 1 via ORAL
  Filled 2020-04-21 (×11): qty 1

## 2020-04-21 MED ORDER — MIRTAZAPINE 15 MG PO TABS
15.0000 mg | ORAL_TABLET | Freq: Every day | ORAL | Status: DC
  Administered 2020-04-21 – 2020-04-26 (×6): 15 mg via ORAL
  Filled 2020-04-21 (×7): qty 1

## 2020-04-21 MED ORDER — ASCORBIC ACID 500 MG PO TABS
500.0000 mg | ORAL_TABLET | Freq: Two times a day (BID) | ORAL | Status: DC
  Administered 2020-04-21 – 2020-05-02 (×20): 500 mg via ORAL
  Filled 2020-04-21 (×21): qty 1

## 2020-04-21 MED ORDER — DIPHENHYDRAMINE HCL 25 MG PO CAPS
50.0000 mg | ORAL_CAPSULE | Freq: Every evening | ORAL | Status: DC | PRN
  Administered 2020-04-21 – 2020-04-30 (×9): 50 mg via ORAL
  Filled 2020-04-21 (×9): qty 2

## 2020-04-21 MED ORDER — PROSOURCE PLUS PO LIQD
30.0000 mL | Freq: Three times a day (TID) | ORAL | Status: DC
  Administered 2020-04-21 – 2020-05-02 (×15): 30 mL via ORAL
  Filled 2020-04-21 (×22): qty 30

## 2020-04-21 NOTE — Progress Notes (Signed)
Mobility Specialist - Progress Note   04/21/20 1207  Mobility  Activity  (Bed Exercises)  Level of Assistance Minimal assist, patient does 75% or more  Mobility Response Tolerated well  Mobility performed by Mobility specialist  $Mobility charge 1 Mobility    Pre-mobility: 76 HR, 118/58 BP, 100% SpO2 Post-mobility: 76 HR, 124/66 BP, 99% SpO2   Pt was in bed w/ daughter present in room. Pt agreed to session. While laying in bed, pt was already having 8/10 pain in her R buttock region and 10/10 pain in her R ankle. D/t pain, mobility session only consisted of bed-level exercises performed on the L leg. Bed exercises consisted of: slr, quad sets, heel slides, hip ab/ad. Pt tolerated session well. Pt did state it was painful moving her L leg as well, therefore needing min. Assist to complete exercise. Nurse was notified and present in room during session. Pt left in bed w/ daughter and nurse present in room.    Greyson Riccardi Mobility Specialist  04/21/20, 12:13 PM

## 2020-04-21 NOTE — Progress Notes (Addendum)
Initial Nutrition Assessment  DOCUMENTATION CODES:   Severe malnutrition in context of chronic illness  INTERVENTION:   Boost Breeze po TID, each supplement provides 250 kcal and 9 grams of protein  Pro-Source 14ml TID via tube, provides 40kcal and 11g of protein per serving   Ocuvite daily for wound healing (provides zinc, vitamin A, vitamin C, Vitamin E, copper, and selenium)  Rena-vite daily   Vitamin C 500mg  po BID   Liberalize diet   Pt would likely benefit from G-tube placement and nutrition support pending her GOC  NUTRITION DIAGNOSIS:   Severe Malnutrition related to chronic illness (CHF, COPD, CABG, ESRD on HD, chronic pancreatits) as evidenced by severe fat depletion, severe muscle depletion, 30 percent weight loss in 1 year and 15 percent weight loss in 3 months.  GOAL:   Patient will meet greater than or equal to 90% of their needs  MONITOR:   PO intake, Supplement acceptance, Weight trends, Labs, Skin, I & O's  REASON FOR ASSESSMENT:   Consult Assessment of nutrition requirement/status  ASSESSMENT:   69 y.o. female with medical history significant for s/p CABG and DES, s/p mitral valve repair, ESRD on TTS HD, atrial fibrillation s/p Maze procedure 2012 not on anticoagulation, chronic diastolic CHF (EF 03-47%, Q2VZ by TTE 05/2018), PAD s/p b/l renal artery stents and left subclavian stent, AAA s/p endovascular repair 2019, chronic pancreatitis on chronic narcotics, COPD on 2 L supplemental O2 via [place] at night, HTN, HLD, RBBB, and depression, who presents to the emergency room with a several day history of weakness and poor oral intake   Pt s/p I & D 8/15  Pt is well known to nutrition department and this RD from multiple previous admits. Pt with poor appetite and oral intake at baseline. Per chart, pt has lost 33lbs(30%) over the past year and 14lbs(15%) over the past 3 months; this is severe weight loss. Pt with multiple non-healing wounds and ecchymosis; pt  likely with chronic scurvy r/t poor po and chronic HD. Pt does not like "milky" supplements but she does enjoy Boost Breeze and has taken Prostat in the past. RD will add supplements and vitamins to encourage wound healing and replace losses from HD. RD will also liberalize pt's diet. Pt is at high refeed risk. Pt on Remeron and Marinol. Pt ate 50% of her lunch today. Pt would likely benefit from G-tube placement and tube feeds pending her GOC. Palliative care consult pending.   Medications reviewed and include: allopurinol, plavix, marinol, heparin, insulin, creon, remeron, protonix, unasyn  Labs reviewed: K 3.4(L), BUN 33(H), creat 3.46(H) Hgb 10.1(L), Hct 28.0(L)  NUTRITION - FOCUSED PHYSICAL EXAM:    Most Recent Value  Orbital Region Severe depletion  Upper Arm Region Severe depletion  Thoracic and Lumbar Region Severe depletion  Buccal Region Severe depletion  Temple Region Severe depletion  Clavicle Bone Region Severe depletion  Clavicle and Acromion Bone Region Severe depletion  Scapular Bone Region Severe depletion  Dorsal Hand Severe depletion  Patellar Region Severe depletion  Anterior Thigh Region Severe depletion  Posterior Calf Region Severe depletion  Edema (RD Assessment) Moderate  Hair Reviewed  Eyes Reviewed  Mouth Reviewed  Skin Reviewed  Nails Reviewed     Diet Order:   Diet Order            Diet heart healthy/carb modified Room service appropriate? Yes; Fluid consistency: Thin  Diet effective now  EDUCATION NEEDS:   Education needs have been addressed  Skin:  Skin Assessment: Reviewed RN Assessment (Right buttock lesion and right medial foot lesion, ecchymosis)  Last BM:  8/15- type 7  Height:   Ht Readings from Last 1 Encounters:  04/20/20 5\' 5"  (1.651 m)    Weight:   Wt Readings from Last 1 Encounters:  04/20/20 36.7 kg    Ideal Body Weight:  56.8 kg  BMI:  Body mass index is 13.46 kg/m.  Estimated Nutritional Needs:    Kcal:  1300-1500kcal/day  Protein:  65-75g/day  Fluid:  UOP +1L  Amy Distance MS, RD, LDN Please refer to [facility] for RD and/or RD on-call/weekend/after hours pager

## 2020-04-21 NOTE — Progress Notes (Signed)
[facility] Day(s): 1.   Post op day(s): 1 day s/p bedside I&D, right ischial decubitus ulceration  Interval History:  Patient seen and examined no acute events or new complaints overnight.  Patient reports pain on her right lateral ankle and right ischial wound, asking for pain medications No fever, chills, nausea, or emesis  No new labs/imaging this morning Tolerating regular diet, having bowel function Continuing with Santyl dressing changes BID No new complaints.   Vital signs in last 24 hours: [min-max] current  Temp:  [98 F (36.7 C)-99 F (37.2 C)] 98 F (36.7 C) (08/16 0406) Pulse Rate:  [66-74] 66 (08/16 0406) Resp:  [16-18] 18 (08/16 0406) BP: (109-134)/(47-57) 134/57 (08/16 0406) SpO2:  [96 %-100 %] 100 % (08/16 0406) Weight:  [36.7 kg] 36.7 kg (08/15 2033)     Height: 5\' 5"  (165.1 cm) Weight: 36.7 kg BMI (Calculated): 13.46   Intake/Output last 2 shifts:  08/15 0701 - 08/16 0700 In: 619.3 [P.O.:420; IV Piggyback:199.3] Out: -    Physical Exam:  Constitutional: alert, cooperative and no distress  Respiratory: breathing non-labored at rest  Cardiovascular: regular rate and sinus rhythm  Integumentary: right ischial stage IV decubitus ulceration, bone palpable  Right Ischial Wound (04/21/2020):      Labs:  CBC Latest Ref Rng & Units 04/20/2020 04/19/2020 02/06/2020  WBC 4.0 - 10.5 K/uL 10.0 9.5 9.3  Hemoglobin 12.0 - 15.0 g/dL 11.1(L) 10.7(L) 12.3  Hematocrit 36 - 46 % 32.4(L) 31.4(L) 36.8  Platelets 150 - 400 K/uL 176 152 141.0(L)   CMP Latest Ref Rng & Units 04/20/2020 04/19/2020 02/06/2020  Glucose 70 - 99 mg/dL - 96 137(H)  BUN 8 - 23 mg/dL - 27(H) 15  Creatinine 0.44 - 1.00 mg/dL 3.77(H) 2.90(H) 2.10(H)  Sodium 135 - 145 mmol/L - 135 139  Potassium 3.5 - 5.1 mmol/L - 4.1 3.1(L)  Chloride 98 - 111 mmol/L - 99 98  CO2 22 - 32 mmol/L - 26 33(H)  Calcium 8.9 - 10.3 mg/dL - 8.0(L) 8.2(L)  Total Protein 6.5  - 8.1 g/dL - - -  Total Bilirubin 0.3 - 1.2 mg/dL - - -  Alkaline Phos 38 - 126 U/L - - -  AST 15 - 41 U/L - - -  ALT 0 - 44 U/L - - -     Imaging studies: No new pertinent imaging studies   Assessment/Plan:  69 y.o. female 1 day s/p bedside I&D for unstageable right ischial decubitus ulceration with underlying liquefactive necrosis, complicated by multiple pertinent comorbidities.   - Continue local wound care with Santyl Dressings BID; I did to this this morning  - Continue frequent repositioning; pressure offloading  - No further debridement indicated   - Continue IV Abx (Unasyn)  - Pain control prn  - Mobilize if feasible   - further management per primary service    - No further general surgery needs identified; will sign off; continue with wound care as above, follow up with wound care center at discharge    All of the above findings and recommendations were discussed with the patient, and the medical team, and all of patient's questions were answered to her expressed satisfaction.  -- Edison Simon, PA-C  [facility] 04/21/2020, 7:28 AM [phone] M-F: 7am - 4pm

## 2020-04-21 NOTE — Telephone Encounter (Signed)
T/c from Seth Bake, Therapist, sports from home health. Discussed the recent hospital admission. I have advised our liaison team that pt is inpatient and would recommend PC consult if team at [facility] concurs.

## 2020-04-21 NOTE — Progress Notes (Signed)
°   04/21/20 1000  Clinical Encounter Type  Visited With Patient and family together  Visit Type Initial;Spiritual support;Social support  Referral From Nurse  Consult/Referral To Chaplain  Ch visited patient per OR for AD. Pt and daughter decline the AD. Pt express to Ch that she was depressed and that she did not have a appetite. Pt asked the Ch for prayer. I prayed for the Pt. Ch will follow-up with Pt and family.

## 2020-04-21 NOTE — Progress Notes (Signed)
[facility], [place] 04/21/20  Subjective:   LOS: 1   Patient is doing fair Denies any acute complaints Reports pain in the buttock and ankle Has small amount of edema Not eating much.  Requesting to change diet to regular diet.  Objective:  Vital signs in last 24 hours:  Temp:  [97.7 F (36.5 C)-99 F (37.2 C)] 97.7 F (36.5 C) (08/16 1148) Pulse Rate:  [66-78] 78 (08/16 1148) Resp:  [16-18] 18 (08/16 0406) BP: (109-134)/(47-66) 124/66 (08/16 1148) SpO2:  [96 %-100 %] 99 % (08/16 1148) Weight:  [36.7 kg] 36.7 kg (08/15 2033)  Weight change: -0.949 kg Filed Weights   04/19/20 1218 04/20/20 2033  Weight: 37.6 kg 36.7 kg    Intake/Output:    Intake/Output Summary (Last 24 hours) at 04/21/2020 1447 Last data filed at 04/21/2020 1347 Gross per 24 hour  Intake 439.27 ml  Output --  Net 439.27 ml    Physical Exam: General:  No acute distress, laying in the bed, thin cachectic  HEENT  anicteric, moist oral mucous membrane  Pulm/lungs  normal breathing effort, lungs are clear to auscultation  CVS/Heart  regular rhythm, no rub or gallop  Abdomen:   Soft, nontender  Extremities:  Trace to 1+ peripheral edema  Neurologic:  Alert, oriented, able to follow commands  Skin:  No acute rashes  Left arm AV fistula    Basic Metabolic Panel:  Recent Labs  Lab 04/19/20 1219 04/20/20 2040 04/21/20 0345  NA 135  --  137  K 4.1  --  3.4*  CL 99  --  100  CO2 26  --  23  GLUCOSE 96  --  113*  BUN 27*  --  33*  CREATININE 2.90* 3.77* 3.46*  CALCIUM 8.0*  --  7.6*  MG  --   --  1.8     CBC: Recent Labs  Lab 04/19/20 1219 04/20/20 2040 04/21/20 0345  WBC 9.5 10.0 8.2  NEUTROABS  --   --  6.8  HGB 10.7* 11.1* 10.1*  HCT 31.4* 32.4* 28.0*  MCV 102.6* 102.9* 98.6  PLT 152 176 152      Lab Results  Component Value Date   HEPBSAG NON REACTIVE 07/28/2019   HEPBSAB Non Reactive 07/27/2018      Microbiology:  Recent Results  (from the past 240 hour(s))  Urine culture     Status: Abnormal (Preliminary result)   Collection Time: 04/19/20 12:06 PM   Specimen: Urine, Random  Result Value Ref Range Status   Specimen Description   Final    URINE, RANDOM Performed at [facility], [address] [place], [place] [address]    Special Requests   Final    NONE Performed at [facility], [address], [place], [place] [address]    Culture (A)  Final    >=100,000 COLONIES/mL ESCHERICHIA COLI >=100,000 COLONIES/mL ENTEROCOCCUS FAECALIS    Report Status PENDING  Incomplete  SARS Coronavirus 2 by RT PCR (hospital order, performed in [place] hospital lab) Nasopharyngeal Nasopharyngeal Swab     Status: None   Collection Time: 04/20/20  1:46 AM   Specimen: Nasopharyngeal Swab  Result Value Ref Range Status   SARS Coronavirus 2 NEGATIVE NEGATIVE Final    Comment: (NOTE) SARS-CoV-2 target nucleic acids are NOT DETECTED.  The SARS-CoV-2 RNA is generally detectable in upper and lower respiratory specimens during the acute phase of infection. The lowest concentration of SARS-CoV-2 viral copies this assay  can detect is 250 copies / mL. A negative result does not preclude SARS-CoV-2 infection and should not be used as the sole basis for treatment or other patient management decisions.  A negative result may occur with improper specimen collection / handling, submission of specimen other than nasopharyngeal swab, presence of viral mutation(s) within the areas targeted by this assay, and inadequate number of viral copies (<250 copies / mL). A negative result must be combined with clinical observations, patient history, and epidemiological information.  Fact Sheet for Patients:   StrictlyIdeas.no  Fact Sheet for Healthcare Providers: BankingDealers.co.za  This test is not yet approved or  cleared by the [place] FDA and has been authorized for  detection and/or diagnosis of SARS-CoV-2 by FDA under an Emergency Use Authorization (EUA).  This EUA will remain in effect (meaning this test can be used) for the duration of the COVID-19 declaration under Section 564(b)(1) of the Act, 21 U.S.C. section 360bbb-3(b)(1), unless the authorization is terminated or revoked sooner.  Performed at [facility], [address] [place], [place] [address]     Coagulation Studies: No results for input(s): LABPROT, INR in the last 72 hours.  Urinalysis: Recent Labs    04/19/20 1206  COLORURINE AMBER*  LABSPEC 1.021  PHURINE 5.0  GLUCOSEU NEGATIVE  HGBUR NEGATIVE  BILIRUBINUR NEGATIVE  KETONESUR NEGATIVE  PROTEINUR NEGATIVE  NITRITE NEGATIVE  LEUKOCYTESUR MODERATE*      Imaging: CT PELVIS WO CONTRAST  Result Date: 04/19/2020 CLINICAL DATA:  Right buttocks ulcer EXAM: CT PELVIS WITHOUT CONTRAST TECHNIQUE: Multidetector CT imaging of the pelvis was performed following the standard protocol without intravenous contrast. COMPARISON:  None. FINDINGS: Urinary Tract: The bladder is mildly distended and fluid-filled. Bowel: There is a moderate amount of rectal colonic stool present. An anastomosis seen within the rectum. Scattered colonic diverticula are noted. There is mild presacral edema. Vascular/Lymphatic: No enlarged pelvic lymph nodes identified. Again noted is a partially visualized aorto bi-iliac stent graft. Scattered aortic atherosclerotic calcifications are seen without aneurysmal dilatation. Reproductive: The patient is status post hysterectomy. No adnexal masses or collections seen. Other: Along the right posterior coccygeal soft tissues there is a superficial ulceration and heterogeneous skin thickening with non loculated fluid. No subcutaneous emphysema seen. No areas of cortical destruction or periosteal reaction. There is diffuse anasarca noted. Musculoskeletal: No acute or significant osseous findings. IMPRESSION:  Superficial ulceration overlying the right inferior coccyx. There is skin thickening with heterogeneous non loculated fluid/phlegmon. No loculated fluid collection, subcutaneous emphysema or definite evidence of osteomyelitis. Aortic Atherosclerosis (ICD10-I70.0). Aorto bi-iliac stent graft Electronically Signed   By: Prudencio Pair M.D.   On: 04/19/2020 23:17     Medications:   . ampicillin-sulbactam (UNASYN) IV 1.5 g (04/21/20 1342)   . (feeding supplement) PROSource Plus  30 mL Oral TID BM  . allopurinol  100 mg Oral QHS  . vitamin C  500 mg Oral BID  . atorvastatin  40 mg Oral QHS  . clopidogrel  75 mg Oral QHS  . collagenase   Topical Daily  . dronabinol  2.5 mg Oral BID AC  . feeding supplement  1 Container Oral TID BM  . gabapentin  300 mg Oral QHS  . heparin  5,000 Units Subcutaneous Q8H  . insulin aspart  0-5 Units Subcutaneous QHS  . insulin aspart  0-6 Units Subcutaneous TID WC  . lipase/protease/amylase  24,000 Units Oral TID  . metoprolol tartrate  12.5 mg Oral BID  . mirtazapine  15  mg Oral QHS  . multivitamin  1 tablet Oral QHS  . [START ON 04/22/2020] multivitamin-lutein  1 capsule Oral Daily  . pantoprazole  40 mg Oral Daily  . sertraline  50 mg Oral Daily  . traZODone  150 mg Oral QHS   acetaminophen **OR** acetaminophen, albuterol, alum & mag hydroxide-simeth, HYDROmorphone (DILAUDID) injection, HYDROmorphone, midodrine, nitroGLYCERIN, ondansetron **OR** ondansetron (ZOFRAN) IV, promethazine  Assessment/ Plan:  70 y.o. female with medical problems of ESRD, on hemodialysis, CAD/CABG 03/2011, COPD, atrial fibrillation, hypertension, COPD, chronic pancreatitis and chronic abdominal pain requiring recurrent hospitalizations , h/o AAA, cholecystectomy, severe peripheral arterial disease including bilateral renal artery stent, subclavian artery stenosis, left, left CEA  was admitted on 04/19/2020 for  Principal Problem:   Inadequate oral intake Active Problems:    Essential hypertension   Atrial fibrillation (HCC)   Chronic diastolic heart failure (HCC)   COPD with chronic bronchitis (HCC)   S/P CABG x 3   Diabetes mellitus (HCC)   Chronic kidney disease (CKD), stage IV (severe) (HCC)   Severe protein-calorie malnutrition (HCC)   Pressure injury, stage 4 ([place])   ESRD on hemodialysis ([place])   UTI (urinary tract infection)   Infected wound  Infected wound [T14.8XXA, L08.9] Weakness [R53.1] Failure to thrive in adult [R62.7] Inadequate oral intake [R63.8] Acute cystitis with hematuria [N30.01] Wound of right buttock, initial encounter [S31.819A] Pressure injury of sacral region, stage 1 [facility] [facility]/TTS/left arm AV fistula/CK [place]  #. ESRD Plan for HD tomorrow Keep TTS schedule Patient requesting to change to regular diet.  She thinks she might be able to take in more calories with that.  #. Anemia of CKD  Lab Results  Component Value Date   HGB 10.1 (L) 04/21/2020   Low dose EPO with HD  #. Secondary hyperparathyroidism of renal origin N 25.81      Component Value Date/Time   PTH 80 (H) 08/15/2018 1435   Lab Results  Component Value Date   PHOS 2.1 (L) 10/25/2019   Monitor calcium and phos level during this admission   #.  Rt Buttock wound Eval by wound care nurse S/p I & D rt ischial decub ulcer Concern about severe protein calorie malnutrition   LOS: 1 Briton Sellman 8/16/20212:47 PM  [facility] [place], [place]

## 2020-04-21 NOTE — Progress Notes (Signed)
PROGRESS NOTE    SYVILLA MARTIN  LKG:[id number] DOB: 17-Dec-1950 DOA: 04/19/2020 PCP: Bernerd Limbo, MD   Brief Narrative:  Briefly this is a 69 year old female with extensive past medical history of ESRD on hemodialysis TTS, CAD status post CABG, status post mitral valve repair, atrial fibrillation on anticoagulation, chronic diastolic congestive heart failure, PAD, bilateral renal artery stenosis, severe protein calorie malnutrition, chronic pancreatitis, chronic narcotic use, COPD, hypertension, depression.  She presented to the emergency department yesterday with weakness, generalized pain, acute on chronic pain of the right buttock ulcer.  The patient's been followed by wound care clinic.  However the wound had worsened and pain worsened to the point that the patient presented to the emergency department.  She is also markedly malnourished weight has dropped to 37.6 kg.  BMI 13.8.  8/16: Patient seen and examined.  Seen by general surgery yesterday.  Status post wound debridement at bedside.  Procalcitonin elevated.  Remains on Unasyn.  Nephrology following.  No immediate plans for dialysis.  Patient complaining of pain this morning.  States that her home Dilaudid is ineffective consistently requiring requesting more.  Also had questions about PEG tube placement.  I explained that she would likely need a colonoscopy or endoscopic evaluation prior and that focus at this time would be on treatment of the wound and associated infection.  Once this is resolved we can consider referral to interventional radiology and gastroenterology for colonoscopy PEG tube placement.   Assessment & Plan:   Principal Problem:   Inadequate oral intake Active Problems:   Essential hypertension   Atrial fibrillation (HCC)   Chronic diastolic heart failure (HCC)   COPD with chronic bronchitis (HCC)   S/P CABG x 3   Diabetes mellitus (HCC)   Chronic kidney disease (CKD), stage IV (severe) (HCC)   Severe  protein-calorie malnutrition (HCC)   Pressure injury, stage 4 (HCC)   ESRD on hemodialysis (HCC)   UTI (urinary tract infection)   Infected wound  Infected right buttock wound Right ankle wound Patient presented with acute on chronic pain These wounds been present she is followed by the wound care clinic Recently had increased pain and increased foul-smelling drainage On admission had elevated procalcitonin was given Rocephin Plan: Continue Unasyn Pain control as needed Appreciate surgery follow-up WOC follow-up  Poor p.o. intake Severe protein calorie malnutrition Adult failure to thrive Patient has had a 2-year history of unintentional weight loss Endorses poor appetite's Was being worked up outpatient for a colonoscopy and potential PEG tube evaluation Plan: Nutrition consult, recommendations appreciated Appetite stimulant, Marinol and Remeron Will also consider cyproheptadine PEG tube is a consideration however per outpatient GI patient will need a colonoscopy first.  In her current state would lean towards treating the infection first and then considering a colonoscopy and possible enteral feeding tube placement  ESRD on hemodialysis Nephrology consulted on admission Likely no indication for hemodialysis today Nephrology follow-up  Urinary tract infection Unclear whether this represents a true infection In any case patient is on Unasyn for infected wound  Chronic hypotension On midodrine  Atrial fibrillation Currently rate controlled Not on anticoagulants  Chronic diastolic congestive heart failure Currently euvolemic Cautious with any IV fluids  COPD with chronic bronchitis Not acutely exacerbated Continue home inhaler regimen  CAD status post CABG x3 No acute issues  Diabetes mellitus Hold oral agents Sliding scale coverage  Chronic pain Chronic pancreatitis Continue narcotic pain regimen Continue Creon     DVT prophylaxis: Subcutaneous  heparin Code  Status: Full Family Communication: None today Disposition Plan: Status is: Inpatient  Remains inpatient appropriate because:Inpatient level of care appropriate due to severity of illness   Dispo: The patient is from: Home              Anticipated d/c is to: SNF              Anticipated d/c date is: 3 days              Patient currently is not medically stable to d/c.         Consultants:   Nephrology  General surgery  Procedures:   Bedside wound debridement, a 1521  Antimicrobials:   Unasyn, 04/20/2020-   Subjective: Seen and examined.  Continues to endorse severe pain in right buttock and ankle  Objective: Vitals:   04/20/20 2033 04/21/20 0005 04/21/20 0406 04/21/20 1148  BP: (!) 122/55 (!) 109/47 (!) 134/57 124/66  Pulse: 74 67 66 78  Resp: 16 16 18    Temp: 98.5 F (36.9 C) 99 F (37.2 C) 98 F (36.7 C) 97.7 F (36.5 C)  TempSrc:  Oral Oral Oral  SpO2: 100% 98% 100% 99%  Weight: 36.7 kg     Height: 5\' 5"  (1.651 m)       Intake/Output Summary (Last 24 hours) at 04/21/2020 1312 Last data filed at 04/21/2020 0332 Gross per 24 hour  Intake 199.27 ml  Output --  Net 199.27 ml   Filed Weights   04/19/20 1218 04/20/20 2033  Weight: 37.6 kg 36.7 kg    Examination:  General exam: Mild distress.  Appears chronically ill.  Appears weak and emaciated  Respiratory system: Poor respiratory effort.  Bibasilar crackles.  No oxygen requirement Cardiovascular system: Regular rate, regular rhythm, no murmurs Gastrointestinal system: Thin, nondistended, nontender, normal bowel sounds. Central nervous system: Alert and oriented. No focal neurological deficits. Extremities: Decreased power bilaterally upper and lower extremities Skin: Thin, no obvious rashes or lesions Psychiatry: Flattened affect    Data Reviewed: I have personally reviewed following labs and imaging studies  CBC: Recent Labs  Lab 04/19/20 1219 04/20/20 2040 04/21/20 0345   WBC 9.5 10.0 8.2  NEUTROABS  --   --  6.8  HGB 10.7* 11.1* 10.1*  HCT 31.4* 32.4* 28.0*  MCV 102.6* 102.9* 98.6  PLT 152 176 144   Basic Metabolic Panel: Recent Labs  Lab 04/19/20 1219 04/20/20 2040 04/21/20 0345  NA 135  --  137  K 4.1  --  3.4*  CL 99  --  100  CO2 26  --  23  GLUCOSE 96  --  113*  BUN 27*  --  33*  CREATININE 2.90* 3.77* 3.46*  CALCIUM 8.0*  --  7.6*  MG  --   --  1.8   GFR: Estimated Creatinine Clearance: 9.1 mL/min (A) (by C-G formula based on SCr of 3.46 mg/dL (H)). Liver Function Tests: No results for input(s): AST, ALT, ALKPHOS, BILITOT, PROT, ALBUMIN in the last 168 hours. No results for input(s): LIPASE, AMYLASE in the last 168 hours. No results for input(s): AMMONIA in the last 168 hours. Coagulation Profile: No results for input(s): INR, PROTIME in the last 168 hours. Cardiac Enzymes: No results for input(s): CKTOTAL, CKMB, CKMBINDEX, TROPONINI in the last 168 hours. BNP (last 3 results) No results for input(s): PROBNP in the last 8760 hours. HbA1C: Recent Labs    04/20/20 2040  HGBA1C 5.3   CBG: Recent Labs  Lab 04/20/20 1131  04/20/20 1800 04/20/20 2208 04/21/20 0729 04/21/20 1146  GLUCAP 77 120* 162* 100* 109*   Lipid Profile: No results for input(s): CHOL, HDL, LDLCALC, TRIG, CHOLHDL, LDLDIRECT in the last 72 hours. Thyroid Function Tests: No results for input(s): TSH, T4TOTAL, FREET4, T3FREE, THYROIDAB in the last 72 hours. Anemia Panel: No results for input(s): VITAMINB12, FOLATE, FERRITIN, TIBC, IRON, RETICCTPCT in the last 72 hours. Sepsis Labs: Recent Labs  Lab 04/20/20 2040 04/21/20 0345  PROCALCITON 5.32 4.66    Recent Results (from the past 240 hour(s))  Urine culture     Status: Abnormal (Preliminary result)   Collection Time: 04/19/20 12:06 PM   Specimen: Urine, Random  Result Value Ref Range Status   Specimen Description   Final    URINE, RANDOM Performed at [facility], [address] [place], [place] [address]    Special Requests   Final    NONE Performed at [facility], [address], [place], [place] [address]    Culture (A)  Final    >=100,000 COLONIES/mL ESCHERICHIA COLI >=100,000 COLONIES/mL ENTEROCOCCUS FAECALIS    Report Status PENDING  Incomplete  SARS Coronavirus 2 by RT PCR (hospital order, performed in [place] hospital lab) Nasopharyngeal Nasopharyngeal Swab     Status: None   Collection Time: 04/20/20  1:46 AM   Specimen: Nasopharyngeal Swab  Result Value Ref Range Status   SARS Coronavirus 2 NEGATIVE NEGATIVE Final    Comment: (NOTE) SARS-CoV-2 target nucleic acids are NOT DETECTED.  The SARS-CoV-2 RNA is generally detectable in upper and lower respiratory specimens during the acute phase of infection. The lowest concentration of SARS-CoV-2 viral copies this assay can detect is 250 copies / mL. A negative result does not preclude SARS-CoV-2 infection and should not be used as the sole basis for treatment or other patient management decisions.  A negative result may occur with improper specimen collection / handling, submission of specimen other than nasopharyngeal swab, presence of viral mutation(s) within the areas targeted by this assay, and inadequate number of viral copies (<250 copies / mL). A negative result must be combined with clinical observations, patient history, and epidemiological information.  Fact Sheet for Patients:   StrictlyIdeas.no  Fact Sheet for Healthcare Providers: BankingDealers.co.za  This test is not yet approved or  cleared by the [place] FDA and has been authorized for detection and/or diagnosis of SARS-CoV-2 by FDA under an Emergency Use Authorization (EUA).  This EUA will remain in effect (meaning this test can be used) for the duration of the COVID-19 declaration under Section 564(b)(1) of the Act, 21 U.S.C. section 360bbb-3(b)(1), unless the  authorization is terminated or revoked sooner.  Performed at [facility], [address] [place], [place] [address]          Radiology Studies: CT PELVIS WO CONTRAST  Result Date: 04/19/2020 CLINICAL DATA:  Right buttocks ulcer EXAM: CT PELVIS WITHOUT CONTRAST TECHNIQUE: Multidetector CT imaging of the pelvis was performed following the standard protocol without intravenous contrast. COMPARISON:  None. FINDINGS: Urinary Tract: The bladder is mildly distended and fluid-filled. Bowel: There is a moderate amount of rectal colonic stool present. An anastomosis seen within the rectum. Scattered colonic diverticula are noted. There is mild presacral edema. Vascular/Lymphatic: No enlarged pelvic lymph nodes identified. Again noted is a partially visualized aorto bi-iliac stent graft. Scattered aortic atherosclerotic calcifications are seen without aneurysmal dilatation. Reproductive: The patient is status post hysterectomy. No adnexal masses or collections seen. Other: Along the  right posterior coccygeal soft tissues there is a superficial ulceration and heterogeneous skin thickening with non loculated fluid. No subcutaneous emphysema seen. No areas of cortical destruction or periosteal reaction. There is diffuse anasarca noted. Musculoskeletal: No acute or significant osseous findings. IMPRESSION: Superficial ulceration overlying the right inferior coccyx. There is skin thickening with heterogeneous non loculated fluid/phlegmon. No loculated fluid collection, subcutaneous emphysema or definite evidence of osteomyelitis. Aortic Atherosclerosis (ICD10-I70.0). Aorto bi-iliac stent graft Electronically Signed   By: Prudencio Pair M.D.   On: 04/19/2020 23:17        Scheduled Meds: . allopurinol  100 mg Oral QHS  . atorvastatin  40 mg Oral QHS  . clopidogrel  75 mg Oral QHS  . collagenase   Topical Daily  . dronabinol  2.5 mg Oral BID AC  . feeding supplement  1 Container Oral TID BM  .  gabapentin  300 mg Oral QHS  . heparin  5,000 Units Subcutaneous Q8H  . insulin aspart  0-5 Units Subcutaneous QHS  . insulin aspart  0-6 Units Subcutaneous TID WC  . lipase/protease/amylase  24,000 Units Oral TID  . metoprolol tartrate  12.5 mg Oral BID  . mirtazapine  15 mg Oral QHS  . pantoprazole  40 mg Oral Daily  . sertraline  50 mg Oral Daily  . traZODone  150 mg Oral QHS   Continuous Infusions: . ampicillin-sulbactam (UNASYN) IV Stopped (04/21/20 0108)     LOS: 1 day    Time spent: 25 minutes    Sidney Ace, MD [facility] Pager [phone]  If 7PM-7AM, please contact night-coverage 04/21/2020, 1:12 PM

## 2020-04-21 NOTE — Progress Notes (Signed)
[facility] hospital Liaison note:  Patient is currently followed by [facility] community Palliative program at home. [place] notified. Thank you. Flo Shanks BSN, RN, [place] [phone]

## 2020-04-22 DIAGNOSIS — Z515 Encounter for palliative care: Secondary | ICD-10-CM

## 2020-04-22 DIAGNOSIS — Z7189 Other specified counseling: Secondary | ICD-10-CM

## 2020-04-22 DIAGNOSIS — N186 End stage renal disease: Secondary | ICD-10-CM

## 2020-04-22 DIAGNOSIS — E43 Unspecified severe protein-calorie malnutrition: Secondary | ICD-10-CM

## 2020-04-22 DIAGNOSIS — Z992 Dependence on renal dialysis: Secondary | ICD-10-CM

## 2020-04-22 DIAGNOSIS — R627 Adult failure to thrive: Secondary | ICD-10-CM

## 2020-04-22 LAB — URINE CULTURE: Culture: >=100000 — AB

## 2020-04-22 LAB — BASIC METABOLIC PANEL
Anion gap: 8 (ref 5–15)
BUN: 33 mg/dL — ABNORMAL HIGH (ref 8–23)
CO2: 25 mmol/L (ref 22–32)
Calcium: 7.5 mg/dL — ABNORMAL LOW (ref 8.9–10.3)
Chloride: 102 mmol/L (ref 98–111)
Creatinine, Ser: 3.34 mg/dL — ABNORMAL HIGH (ref 0.44–1.00)
GFR calc Af Amer: 16 mL/min — ABNORMAL LOW (ref >60)
GFR calc non Af Amer: 14 mL/min — ABNORMAL LOW (ref >60)
Glucose, Bld: 93 mg/dL (ref 70–99)
Potassium: 3.3 mmol/L — ABNORMAL LOW (ref 3.5–5.1)
Sodium: 135 mmol/L (ref 135–145)

## 2020-04-22 LAB — CBC WITH DIFFERENTIAL/PLATELET
Abs Immature Granulocytes: 0.06 10*3/uL (ref 0.00–0.07)
Basophils Absolute: 0 10*3/uL (ref 0.0–0.1)
Basophils Relative: 0 %
Eosinophils Absolute: 0 10*3/uL (ref 0.0–0.5)
Eosinophils Relative: 1 %
HCT: 26.7 % — ABNORMAL LOW (ref 36.0–46.0)
Hemoglobin: 9.2 g/dL — ABNORMAL LOW (ref 12.0–15.0)
Immature Granulocytes: 1 %
Lymphocytes Relative: 7 %
Lymphs Abs: 0.5 10*3/uL — ABNORMAL LOW (ref 0.7–4.0)
MCH: 35.4 pg — ABNORMAL HIGH (ref 26.0–34.0)
MCHC: 34.5 g/dL (ref 30.0–36.0)
MCV: 102.7 fL — ABNORMAL HIGH (ref 80.0–100.0)
Monocytes Absolute: 0.6 10*3/uL (ref 0.1–1.0)
Monocytes Relative: 9 %
Neutro Abs: 5.8 10*3/uL (ref 1.7–7.7)
Neutrophils Relative %: 82 %
Platelets: 122 10*3/uL — ABNORMAL LOW (ref 150–400)
RBC: 2.6 MIL/uL — ABNORMAL LOW (ref 3.87–5.11)
RDW: 14.7 % (ref 11.5–15.5)
WBC: 7.1 10*3/uL (ref 4.0–10.5)
nRBC: 0 % (ref 0.0–0.2)

## 2020-04-22 LAB — PROCALCITONIN: Procalcitonin: 2.62 ng/mL

## 2020-04-22 LAB — GLUCOSE, CAPILLARY
Glucose-Capillary: 77 mg/dL (ref 70–99)
Glucose-Capillary: 86 mg/dL (ref 70–99)
Glucose-Capillary: 84 mg/dL (ref 70–99)

## 2020-04-22 LAB — HEPATITIS B SURFACE ANTIGEN: Hepatitis B Surface Ag: NONREACTIVE

## 2020-04-22 LAB — MAGNESIUM: Magnesium: 2.1 mg/dL (ref 1.7–2.4)

## 2020-04-22 LAB — PHOSPHORUS: Phosphorus: 4 mg/dL (ref 2.5–4.6)

## 2020-04-22 MED ORDER — HYDROMORPHONE HCL 1 MG/ML IJ SOLN
1.5000 mg | INTRAMUSCULAR | Status: DC | PRN
  Administered 2020-04-22 – 2020-04-23 (×5): 1.5 mg via INTRAVENOUS
  Filled 2020-04-22 (×5): qty 1.5

## 2020-04-22 MED ORDER — SODIUM CHLORIDE 0.9 % IV SOLN
3.0000 g | INTRAVENOUS | Status: DC
  Administered 2020-04-22 – 2020-04-23 (×2): 3 g via INTRAVENOUS
  Filled 2020-04-22 (×2): qty 3
  Filled 2020-04-22: qty 8

## 2020-04-22 NOTE — Progress Notes (Signed)
[facility], [place] 04/22/20  Subjective:   LOS: 2   Patient is doing fair Denies any acute complaints Reports pain in the buttock and ankle States that appetite is still low   Objective:  Vital signs in last 24 hours:  Temp:  [97.7 F (36.5 C)-98.6 F (37 C)] 97.7 F (36.5 C) (08/17 0454) Pulse Rate:  [65-75] 65 (08/17 0454) Resp:  [17-18] 17 (08/17 0454) BP: (119-121)/(51) 121/51 (08/17 0454) SpO2:  [99 %-100 %] 100 % (08/17 0454)  Weight change:  Filed Weights   04/19/20 1218 04/20/20 2033  Weight: 37.6 kg 36.7 kg    Intake/Output:    Intake/Output Summary (Last 24 hours) at 04/22/2020 1307 Last data filed at 04/22/2020 0500 Gross per 24 hour  Intake 660 ml  Output --  Net 660 ml    Physical Exam: General:  No acute distress, laying in the bed, thin cachectic  HEENT  anicteric, moist oral mucous membrane  Pulm/lungs  normal breathing effort, lungs are clear to auscultation  CVS/Heart  regular rhythm, no rub or gallop  Abdomen:   Soft, nontender  Extremities:  Trace to 1+ peripheral edema  Neurologic:  Alert, oriented, able to follow commands  Skin:  No acute rashes  Left arm AV fistula    Basic Metabolic Panel:  Recent Labs  Lab 04/19/20 1219 04/20/20 2040 04/21/20 0345 04/22/20 0443  NA 135  --  137 135  K 4.1  --  3.4* 3.3*  CL 99  --  100 102  CO2 26  --  23 25  GLUCOSE 96  --  113* 93  BUN 27*  --  33* 33*  CREATININE 2.90* 3.77* 3.46* 3.34*  CALCIUM 8.0*  --  7.6* 7.5*  MG  --   --  1.8 2.1  PHOS  --   --   --  4.0     CBC: Recent Labs  Lab 04/19/20 1219 04/20/20 2040 04/21/20 0345 04/22/20 0443  WBC 9.5 10.0 8.2 7.1  NEUTROABS  --   --  6.8 5.8  HGB 10.7* 11.1* 10.1* 9.2*  HCT 31.4* 32.4* 28.0* 26.7*  MCV 102.6* 102.9* 98.6 102.7*  PLT 152 176 152 122*      Lab Results  Component Value Date   HEPBSAG NON REACTIVE 07/28/2019   HEPBSAB Non Reactive 07/27/2018       Microbiology:  Recent Results (from the past 240 hour(s))  Urine culture     Status: Abnormal (Preliminary result)   Collection Time: 04/19/20 12:06 PM   Specimen: Urine, Random  Result Value Ref Range Status   Specimen Description   Final    URINE, RANDOM Performed at [facility], [address], [place], [place] [address]    Special Requests   Final    NONE Performed at [facility], [address], [place], [place] [address]    Culture (A)  Final    >=100,000 COLONIES/mL ESCHERICHIA COLI >=100,000 COLONIES/mL ENTEROCOCCUS FAECALIS    Report Status PENDING  Incomplete  SARS Coronavirus 2 by RT PCR (hospital order, performed in [place] hospital lab) Nasopharyngeal Nasopharyngeal Swab     Status: None   Collection Time: 04/20/20  1:46 AM   Specimen: Nasopharyngeal Swab  Result Value Ref Range Status   SARS Coronavirus 2 NEGATIVE NEGATIVE Final    Comment: (NOTE) SARS-CoV-2 target nucleic acids are NOT DETECTED.  The SARS-CoV-2 RNA is generally detectable in upper and lower respiratory specimens during the  acute phase of infection. The lowest concentration of SARS-CoV-2 viral copies this assay can detect is 250 copies / mL. A negative result does not preclude SARS-CoV-2 infection and should not be used as the sole basis for treatment or other patient management decisions.  A negative result may occur with improper specimen collection / handling, submission of specimen other than nasopharyngeal swab, presence of viral mutation(s) within the areas targeted by this assay, and inadequate number of viral copies (<250 copies / mL). A negative result must be combined with clinical observations, patient history, and epidemiological information.  Fact Sheet for Patients:   StrictlyIdeas.no  Fact Sheet for Healthcare Providers: BankingDealers.co.za  This test is not yet approved or  cleared by the  [place] FDA and has been authorized for detection and/or diagnosis of SARS-CoV-2 by FDA under an Emergency Use Authorization (EUA).  This EUA will remain in effect (meaning this test can be used) for the duration of the COVID-19 declaration under Section 564(b)(1) of the Act, 21 U.S.C. section 360bbb-3(b)(1), unless the authorization is terminated or revoked sooner.  Performed at [facility], [address] [place], [place] [address]     Coagulation Studies: No results for input(s): LABPROT, INR in the last 72 hours.  Urinalysis: No results for input(s): COLORURINE, LABSPEC, PHURINE, GLUCOSEU, HGBUR, BILIRUBINUR, KETONESUR, PROTEINUR, UROBILINOGEN, NITRITE, LEUKOCYTESUR in the last 72 hours.  Invalid input(s): APPERANCEUR    Imaging: No results found.   Medications:   . ampicillin-sulbactam (UNASYN) IV     . (feeding supplement) PROSource Plus  30 mL Oral TID BM  . allopurinol  100 mg Oral QHS  . vitamin C  500 mg Oral BID  . atorvastatin  40 mg Oral QHS  . Chlorhexidine Gluconate Cloth  6 each Topical Q0600  . clopidogrel  75 mg Oral QHS  . collagenase   Topical Daily  . dronabinol  2.5 mg Oral BID AC  . epoetin (EPOGEN/PROCRIT) injection  4,000 Units Intravenous Q T,Th,Sa-HD  . feeding supplement  1 Container Oral TID BM  . gabapentin  300 mg Oral QHS  . heparin  5,000 Units Subcutaneous Q8H  . insulin aspart  0-5 Units Subcutaneous QHS  . insulin aspart  0-6 Units Subcutaneous TID WC  . lipase/protease/amylase  24,000 Units Oral TID  . metoprolol tartrate  12.5 mg Oral BID  . mirtazapine  15 mg Oral QHS  . multivitamin  1 tablet Oral QHS  . multivitamin-lutein  1 capsule Oral Daily  . nicotine  21 mg Transdermal Daily  . pantoprazole  40 mg Oral Daily  . sertraline  50 mg Oral Daily  . traZODone  150 mg Oral QHS   acetaminophen **OR** acetaminophen, albuterol, alum & mag hydroxide-simeth, diphenhydrAMINE, HYDROmorphone (DILAUDID) injection,  HYDROmorphone, midodrine, nitroGLYCERIN, ondansetron **OR** ondansetron (ZOFRAN) IV, promethazine  Assessment/ Plan:  68 y.o. female with medical problems of ESRD, on hemodialysis, CAD/CABG 03/2011, COPD, atrial fibrillation, hypertension, COPD, chronic pancreatitis and chronic abdominal pain requiring recurrent hospitalizations , h/o AAA, cholecystectomy, severe peripheral arterial disease including bilateral renal artery stent, subclavian artery stenosis, left, left CEA  was admitted on 04/19/2020 for  Principal Problem:   Inadequate oral intake Active Problems:   Essential hypertension   Atrial fibrillation (HCC)   Chronic diastolic heart failure (HCC)   COPD with chronic bronchitis (HCC)   S/P CABG x 3   Diabetes mellitus (HCC)   Chronic kidney disease (CKD), stage IV (severe) (HCC)   Severe protein-calorie malnutrition ([place])  Pressure injury, stage 4 ([place])   ESRD on hemodialysis ([place])   UTI (urinary tract infection)   Infected wound  Infected wound [T14.8XXA, L08.9] Weakness [R53.1] Failure to thrive in adult [R62.7] Inadequate oral intake [R63.8] Acute cystitis with hematuria [N30.01] Wound of right buttock, initial encounter [S31.819A] Pressure injury of sacral region, stage 1 [place] [facility]/TTS/left arm AV fistula/[facility] [place]  #. ESRD HD today Keep TTS schedule [place] diet to encourage intake  #. Anemia of CKD  Lab Results  Component Value Date   HGB 9.2 (L) 04/22/2020   Low dose EPO with HD  #. Secondary hyperparathyroidism of renal origin N 25.81      Component Value Date/Time   PTH 80 (H) 08/15/2018 1435   Lab Results  Component Value Date   PHOS 4.0 04/22/2020   Monitor calcium and phos level during this admission   #.  Rt Buttock wound Eval by wound care nurse S/p I & D rt ischial decub ulcer Concern about severe protein calorie malnutrition Getting oral protein supplements    LOS: 2 [name] 8/17/20211:07 PM  [facility] [place], [place]

## 2020-04-22 NOTE — Progress Notes (Addendum)
Pt's daughter, Kirke Shaggy, is requesting to speak to case manager and social worker in the AM re: discharge planning, pls call her anytime tomorrow at: [phone]

## 2020-04-22 NOTE — Progress Notes (Signed)
PROGRESS NOTE    [name]  ZOX:[id number] DOB: 1951-08-11 DOA: 04/19/2020 PCP: Bernerd Limbo, MD   Brief Narrative:  Briefly this is a 69 year old female with extensive past medical history of ESRD on hemodialysis TTS, CAD status post CABG, status post mitral valve repair, atrial fibrillation on anticoagulation, chronic diastolic congestive heart failure, PAD, bilateral renal artery stenosis, severe protein calorie malnutrition, chronic pancreatitis, chronic narcotic use, COPD, hypertension, depression.  She presented to the emergency department yesterday with weakness, generalized pain, acute on chronic pain of the right buttock ulcer.  The patient's been followed by wound care clinic.  However the wound had worsened and pain worsened to the point that the patient presented to the emergency department.  She is also markedly malnourished weight has dropped to 37.6 kg.  BMI 13.8.  8/16: Patient seen and examined.  Seen by general surgery yesterday.  Status post wound debridement at bedside.  Procalcitonin elevated.  Remains on Unasyn.  Nephrology following.  No immediate plans for dialysis.  Patient complaining of pain this morning.  States that her home Dilaudid is ineffective consistently requiring requesting more.  Also had questions about PEG tube placement.  I explained that she would likely need a colonoscopy or endoscopic evaluation prior and that focus at this time would be on treatment of the wound and associated infection.  Once this is resolved we can consider referral to interventional radiology and gastroenterology for colonoscopy PEG tube placement.  8/17: Patient seen and examined.  Still with persistent pain in the right buttock and right ankle.  Procalcitonin decreasing.  White blood cell count decreasing.  Infectious focus slowly resolving.  Again endorsing insufficient pain control.  Again revisited PEG tube issue.  Explained that patient will likely need endoscopic evaluation  prior and at this time the focus would be on resolution of her infection.  She expressed understanding.   Assessment & Plan:   Principal Problem:   Inadequate oral intake Active Problems:   Essential hypertension   Atrial fibrillation (HCC)   Chronic diastolic heart failure (HCC)   COPD with chronic bronchitis (HCC)   S/P CABG x 3   Diabetes mellitus (HCC)   Chronic kidney disease (CKD), stage IV (severe) (HCC)   Severe protein-calorie malnutrition (HCC)   Pressure injury, stage 4 (HCC)   ESRD on hemodialysis (HCC)   UTI (urinary tract infection)   Infected wound  Infected right buttock wound Right ankle wound Patient presented with acute on chronic pain These wounds been present she is followed by the wound care clinic Recently had increased pain and increased foul-smelling drainage On admission had elevated procalcitonin was given Rocephin Plan: Continue Unasyn Pain control as needed Appreciate surgery follow-up WOC follow-up  Poor p.o. intake Severe protein calorie malnutrition Adult failure to thrive Patient has had a 2-year history of unintentional weight loss Endorses poor appetite's Was being worked up outpatient for a colonoscopy and potential PEG tube evaluation Plan: Nutrition consult, recommendations appreciated Appetite stimulant, Marinol and Remeron Will also consider cyproheptadine (hesitant due to ESRD) PEG tube is a consideration however per outpatient GI patient will need a colonoscopy first.  In her current state would lean towards treating the infection first and then considering a colonoscopy and possible enteral feeding tube placement.  Potentially could be deferred outpatient although if her infection resolves then can consider GI and potentially special procedures evaluation  ESRD on hemodialysis Nephrology consulted on admission Discussed with Dr. Candiss Norse Plan for HD today 04/22/2020 Nephrology follow-up  Urinary tract infection Unclear whether  this represents a true infection In any case patient is on Unasyn for infected wound  Chronic hypotension On midodrine  Atrial fibrillation Currently rate controlled Not on anticoagulants  Chronic diastolic congestive heart failure Currently euvolemic Cautious with any IV fluids  COPD with chronic bronchitis Not acutely exacerbated Continue home inhaler regimen  CAD status post CABG x3 No acute issues  Diabetes mellitus Hold oral agents Sliding scale coverage  Chronic pain Chronic pancreatitis Continue narcotic pain regimen Continue Creon     DVT prophylaxis: Subcutaneous heparin Code Status: Full Family Communication: Daughter Almyra Free [phone] on 04/22/2020 Disposition Plan: Status is: Inpatient  Remains inpatient appropriate because:Inpatient level of care appropriate due to severity of illness   Dispo: The patient is from: Home              Anticipated d/c is to: SNF              Anticipated d/c date is: 3 days              Patient currently is not medically stable to d/c.  Remains on IV antibiotics for treatment of wound infection and urinary tract infection.  Hemodialysis today.  Deferring endoscopic evaluation and PEG tube placement while dealing with his acute infection.  Disposition plan pending.   Consultants:   Nephrology  General surgery  Procedures:   Bedside wound debridement, 04/20/20  Antimicrobials:   Unasyn, 04/20/2020-   Subjective: Seen and examined.  Continues to endorse severe pain in right buttock and ankle  Objective: Vitals:   04/22/20 1350 04/22/20 1400 04/22/20 1415 04/22/20 1430  BP: 137/61 (!) 113/54 98/80 (!) 97/47  Pulse:  60 61 60  Resp:      Temp: (!) 97 F (36.1 C)     TempSrc: Oral     SpO2:      Weight:      Height:        Intake/Output Summary (Last 24 hours) at 04/22/2020 1517 Last data filed at 04/22/2020 1300 Gross per 24 hour  Intake 540 ml  Output --  Net 540 ml   Filed Weights   04/19/20  1218 04/20/20 2033  Weight: 37.6 kg 36.7 kg    Examination:  General: Mild distress due to pain.  Appears chronically ill.  Appears frail HEENT: Normocephalic, atraumatic Neck, supple, trachea midline, no tenderness Heart: Regular rate and rhythm, S1/S2 normal, no murmurs Lungs: Bibasilar crackles.  Normal work of breathing.  2 L oxygen Abdomen: Soft, nontender, nondistended, positive bowel sounds Extremities: Muscle wasting noted bilaterally.  No trauma noted. Skin: No rashes or lesions, pale color Neurologic: Cranial nerves grossly intact, sensation intact, alert and oriented x3 Psychiatric: Flattened affect     Data Reviewed: I have personally reviewed following labs and imaging studies  CBC: Recent Labs  Lab 04/19/20 1219 04/20/20 2040 04/21/20 0345 04/22/20 0443  WBC 9.5 10.0 8.2 7.1  NEUTROABS  --   --  6.8 5.8  HGB 10.7* 11.1* 10.1* 9.2*  HCT 31.4* 32.4* 28.0* 26.7*  MCV 102.6* 102.9* 98.6 102.7*  PLT 152 176 152 115*   Basic Metabolic Panel: Recent Labs  Lab 04/19/20 1219 04/20/20 2040 04/21/20 0345 04/22/20 0443  NA 135  --  137 135  K 4.1  --  3.4* 3.3*  CL 99  --  100 102  CO2 26  --  23 25  GLUCOSE 96  --  113* 93  BUN 27*  --  33* 33*  CREATININE 2.90* 3.77* 3.46* 3.34*  CALCIUM 8.0*  --  7.6* 7.5*  MG  --   --  1.8 2.1  PHOS  --   --   --  4.0   GFR: Estimated Creatinine Clearance: 9.5 mL/min (A) (by C-G formula based on SCr of 3.34 mg/dL (H)). Liver Function Tests: No results for input(s): AST, ALT, ALKPHOS, BILITOT, PROT, ALBUMIN in the last 168 hours. No results for input(s): LIPASE, AMYLASE in the last 168 hours. No results for input(s): AMMONIA in the last 168 hours. Coagulation Profile: No results for input(s): INR, PROTIME in the last 168 hours. Cardiac Enzymes: No results for input(s): CKTOTAL, CKMB, CKMBINDEX, TROPONINI in the last 168 hours. BNP (last 3 results) No results for input(s): PROBNP in the last 8760  hours. HbA1C: Recent Labs    04/20/20 2040  HGBA1C 5.3   CBG: Recent Labs  Lab 04/21/20 1146 04/21/20 1626 04/21/20 2118 04/22/20 0735 04/22/20 1203  GLUCAP 109* 109* 95 77 86   Lipid Profile: No results for input(s): CHOL, HDL, LDLCALC, TRIG, CHOLHDL, LDLDIRECT in the last 72 hours. Thyroid Function Tests: No results for input(s): TSH, T4TOTAL, FREET4, T3FREE, THYROIDAB in the last 72 hours. Anemia Panel: No results for input(s): VITAMINB12, FOLATE, FERRITIN, TIBC, IRON, RETICCTPCT in the last 72 hours. Sepsis Labs: Recent Labs  Lab 04/20/20 2040 04/21/20 0345 04/22/20 0443  PROCALCITON 5.32 4.66 2.62    Recent Results (from the past 240 hour(s))  Urine culture     Status: Abnormal   Collection Time: 04/19/20 12:06 PM   Specimen: Urine, Random  Result Value Ref Range Status   Specimen Description   Final    URINE, RANDOM Performed at [facility], [address] [place], [place] [address]    Special Requests   Final    NONE Performed at [facility], [address] [place], [place] [address]    Culture (A)  Final    >=100,000 COLONIES/mL ESCHERICHIA COLI >=100,000 COLONIES/mL ENTEROCOCCUS FAECALIS    Report Status 04/22/2020 FINAL  Final   Organism ID, Bacteria ESCHERICHIA COLI (A)  Final   Organism ID, Bacteria ENTEROCOCCUS FAECALIS (A)  Final      Susceptibility   Escherichia coli - MIC*    AMPICILLIN >=32 RESISTANT Resistant     CEFAZOLIN <=4 SENSITIVE Sensitive     CEFTRIAXONE <=0.25 SENSITIVE Sensitive     CIPROFLOXACIN <=0.25 SENSITIVE Sensitive     GENTAMICIN <=1 SENSITIVE Sensitive     IMIPENEM <=0.25 SENSITIVE Sensitive     NITROFURANTOIN <=16 SENSITIVE Sensitive     TRIMETH/SULFA <=20 SENSITIVE Sensitive     AMPICILLIN/SULBACTAM >=32 RESISTANT Resistant     PIP/TAZO <=4 SENSITIVE Sensitive     * >=100,000 COLONIES/mL ESCHERICHIA COLI   Enterococcus faecalis - MIC*    AMPICILLIN <=2 SENSITIVE Sensitive      NITROFURANTOIN <=16 SENSITIVE Sensitive     VANCOMYCIN 2 SENSITIVE Sensitive     * >=100,000 COLONIES/mL ENTEROCOCCUS FAECALIS  SARS Coronavirus 2 by RT PCR (hospital order, performed in [facility] hospital lab) Nasopharyngeal Nasopharyngeal Swab     Status: None   Collection Time: 04/20/20  1:46 AM   Specimen: Nasopharyngeal Swab  Result Value Ref Range Status   SARS Coronavirus 2 NEGATIVE NEGATIVE Final    Comment: (NOTE) SARS-CoV-2 target nucleic acids are NOT DETECTED.  The SARS-CoV-2 RNA is generally detectable in upper and lower respiratory specimens during the acute phase of infection. The lowest concentration  of SARS-CoV-2 viral copies this assay can detect is 250 copies / mL. A negative result does not preclude SARS-CoV-2 infection and should not be used as the sole basis for treatment or other patient management decisions.  A negative result may occur with improper specimen collection / handling, submission of specimen other than nasopharyngeal swab, presence of viral mutation(s) within the areas targeted by this assay, and inadequate number of viral copies (<250 copies / mL). A negative result must be combined with clinical observations, patient history, and epidemiological information.  Fact Sheet for Patients:   StrictlyIdeas.no  Fact Sheet for Healthcare Providers: BankingDealers.co.za  This test is not yet approved or  cleared by the [place] FDA and has been authorized for detection and/or diagnosis of SARS-CoV-2 by FDA under an Emergency Use Authorization (EUA).  This EUA will remain in effect (meaning this test can be used) for the duration of the COVID-19 declaration under Section 564(b)(1) of the Act, 21 U.S.C. section 360bbb-3(b)(1), unless the authorization is terminated or revoked sooner.  Performed at [facility], [address], [place],  [address]          Radiology  Studies: No results found.      Scheduled Meds: . (feeding supplement) PROSource Plus  30 mL Oral TID BM  . allopurinol  100 mg Oral QHS  . vitamin C  500 mg Oral BID  . atorvastatin  40 mg Oral QHS  . Chlorhexidine Gluconate Cloth  6 each Topical Q0600  . clopidogrel  75 mg Oral QHS  . collagenase   Topical Daily  . dronabinol  2.5 mg Oral BID AC  . epoetin (EPOGEN/PROCRIT) injection  4,000 Units Intravenous Q T,Th,Sa-HD  . feeding supplement  1 Container Oral TID BM  . gabapentin  300 mg Oral QHS  . heparin  5,000 Units Subcutaneous Q8H  . insulin aspart  0-5 Units Subcutaneous QHS  . insulin aspart  0-6 Units Subcutaneous TID WC  . lipase/protease/amylase  24,000 Units Oral TID  . metoprolol tartrate  12.5 mg Oral BID  . mirtazapine  15 mg Oral QHS  . multivitamin  1 tablet Oral QHS  . multivitamin-lutein  1 capsule Oral Daily  . nicotine  21 mg Transdermal Daily  . pantoprazole  40 mg Oral Daily  . sertraline  50 mg Oral Daily  . traZODone  150 mg Oral QHS   Continuous Infusions: . ampicillin-sulbactam (UNASYN) IV       LOS: 2 days    Time spent: 25 minutes    Sidney Ace, MD [facility] Pager [phone]  If 7PM-7AM, please contact night-coverage 04/22/2020, 3:17 PM

## 2020-04-22 NOTE — Progress Notes (Signed)
PHARMACY NOTE:  ANTIMICROBIAL RENAL DOSAGE ADJUSTMENT  Current antimicrobial regimen includes a mismatch between antimicrobial dosage and estimated renal function.  As per policy approved by the Pharmacy & Therapeutics and Medical Executive Committees, the antimicrobial dosage will be adjusted accordingly.  Current antimicrobial dosage:  Unasyn 1.5 grams IV every 12 hours  Indication: infected right buttock wound  Renal Function:  Estimated Creatinine Clearance: 9.5 mL/min (A) (by C-G formula based on SCr of 3.34 mg/dL (H)). [x]      On intermittent HD, scheduled: []      On CRRT    Antimicrobial dosage has been changed to:  Unasyn 3 grams IV every 24 hours.  Administer after dialysis when scheduled dose falls on dialysis days ([place]int[place] 2009)   Thank you for allowing pharmacy to be a part of this patient's care.  Dallie Piles, [facility] 04/22/2020 7:39 AM

## 2020-04-22 NOTE — Progress Notes (Signed)
Mobility Specialist - Progress Note   04/22/20 1222  Mobility  Activity Transferred:  Bed to chair  Level of Assistance Minimal assist, patient does 75% or more  Assistive Device Front wheel walker  Distance Ambulated (ft) 5 ft  Mobility Response Tolerated well  Mobility performed by Mobility specialist  $Mobility charge 1 Mobility    Pre-mobility: 77 HR, 126/66 BP, 98% SpO2 Post-mobility: 82 HR, 120/61 BP, 100% SpO2   Pt was in bed upon arrival. Pt agreed to mobility. Pt stated she is having 8/10 pain in both her R buttock region and R ankle, although is still willing to participate. Pt needed min. Assist getting to EOB. Pt was feeling "woozy" at first d/t not being up since admission. Pt stated pain went down to 6/10 sitting EOB. Pt able to S2S and transfer to chair utilizing RW w/ min. Assist. Pt stated pain went back up to 8/10 after sitting in recliner for awhile but is better than being in bed. States she is "glad to be OOB". In conclusion, pt tolerated session well. Pt left in recliner w/ alarm set. Call bell and phone placed in reach. Nurse was notified.    Shameer Molstad Mobility Specialist  04/22/20, 12:28 PM

## 2020-04-22 NOTE — Progress Notes (Signed)
Hemodialysis patient known at [facility] TTS 11:20am. Patient normally drives self to treatments. Please contact me with any dialysis placement concerns.  Elvera Bicker Dialysis Coordinator [phone]

## 2020-04-22 NOTE — Consult Note (Signed)
Consultation Note Date: 04/22/2020   Patient Name: [name]  DOB: Aug 19, 1951  MRN: [id number]  Age / Sex: 69 y.o., female  PCP: Bernerd Limbo, MD Referring Physician: Sidney Ace, MD  Reason for Consultation: Establishing goals of care and Psychosocial/spiritual support  HPI/Patient Profile: 69 y.o. female  admitted on 04/19/2020 with significant past medical history of ESRD, on hemodialysis, CAD/CABG 03/2011, COPD, atrial fibrillation, hypertension, COPD, chronic pancreatitis and chronic abdominal pain requiring recurrent hospitalizations , h/o AAA, cholecystectomy, severe peripheral arterial disease including bilateral renal artery stent, subclavian artery stenosis, left, left CEA  was admitted on 04/19/2020 for treatment and stabilization.  Fourth admission in the past 6 months.   Patient reports continued physical and functional decline.  Patient and her family face treatment option decisions, advanced directive decisions and anticipatory care needs.  Clinical Assessment and Goals of Care:   This NP Wadie Lessen reviewed medical records, received report from team, assessed the patient and then meet at the patient's bedside  to discuss diagnosis, prognosis, GOC, EOL wishes disposition and options.   Concept of Palliative Care was introduced as specialized medical care for people and their families living with serious illness.  If focuses on providing relief from the symptoms and stress of a serious illness.  The goal is to improve quality of life for both the patient and the family.  Created space and opportunity for patient to explore thoughts and feelings regarding her current medical situation.  Limited insight into her complex medical history and its impact on long-term quality prognosis.   Education offered today regarding advanced directives.  Concepts specific to code status, artifical  feeding and hydration, continued IV antibiotics and rehospitalization was had.    Education regarding the difference between a aggressive medical intervention path  and a palliative comfort care path for this patient at this time was had.  Values and goals of care important to patient and family were attempted to be elicited.      Questions and concerns addressed.  Patient  encouraged to call with questions or concerns.     PMT will continue to support holistically.      Please call to daughter and left message await callback for family inclusion and goals of care.    No documented healthcare power of attorney or advanced directive.  Patient encouraged to utilize spiritual care support to document those directives while she is here in the hospital.  She will consider.      SUMMARY OF RECOMMENDATIONS    Code Status/Advance Care Planning:  Full code   Palliative Prophylaxis:   Bowel Regimen, Delirium Protocol, Frequent Pain Assessment and Oral Care  Additional Recommendations (Limitations, Scope, Preferences):  Full Scope Treatment   PT/OT for generalized weakness and debility  Psycho-social/Spiritual:   Desire for further Chaplaincy support:yes   Prognosis:   Unable to determine  Discharge Planning: To Be Determined      Primary Diagnoses: Present on Admission: . Inadequate oral intake . Atrial fibrillation ([place]) . Essential hypertension .  COPD with chronic bronchitis ([place]) . Chronic kidney disease (CKD), stage IV (severe) ([place]) . Chronic diastolic heart failure ([place]) . Infected wound   I have reviewed the medical record, interviewed the patient and family, and examined the patient. The following aspects are pertinent.  Past Medical History:  Diagnosis Date  . AAA (abdominal aortic aneurysm) ([place]) 5/09   3.7x3.3 by u/s 2009  . Abnormal EKG    deep TW inversions chronic  . Anemia   . CAD (coronary artery disease)    a. CABG 03/2011 LIMA to LAD, SVG to  OM, SVG to PDA. b. cath 06/25/2015 DES to SVG to rPDA, patent LIMA to LAD, patent SVG to OM  . carotid stenosis 5/09   S/p L CEA ;  60-79% bilat ICA by preCABG dopplers 7/12  . CHF (congestive heart failure) ([place])   . Chronic back pain    "all over" (06/24/2015)  . Chronic bronchitis ([place])    "get it pretty much q yr" (06/24/2015)  . CKD (chronic kidney disease)   . Complication of anesthesia 1966   "problem w/ether"  . COPD (chronic obstructive pulmonary disease) ([place])   . Depression   . Dysrhythmia   . GERD (gastroesophageal reflux disease)    With hiatal hernia  . GIB (gastrointestinal bleeding) 9/11   S/p EGD with cautery at [facility]  . Heart murmur   . History of blood transfusion "a few times"   "all related to anemia" (06/24/2015)  . History of hiatal hernia   . Hyperlipidemia   . Hypertension   . Mitral regurgitation    3+ MR by intraoperative TEE;  s/p MV repair with Dr. Roxy Manns 7/12  . Myocardial infarction [facility]) 2012 "several"  . On home oxygen therapy    "2 liters at night; negative for sleep apnea"  . PAD (peripheral artery disease) (HCC)    Severe; s/p bilateral renal artery stents, moderate in-stent restenosis  . Pancreatitis 05/2018  . Paroxysmal atrial fibrillation (HCC)    coumadin;  echo 9/07: EF 60%, mild LVH;  s/p Cox Maze 7/12 with LAA clipping  . Subclavian artery stenosis, left (HCC)    stented by Dr. Trula Slade on 10/18 to help flow of her LIMA to LAD  . Type II diabetes mellitus ([place])    Social History   Socioeconomic History  . Marital status: Widowed    Spouse name: Not on file  . Number of children: Not on file  . Years of education: Not on file  . Highest education level: Not on file  Occupational History  . Occupation: Scientist, water quality in past    Employer: DISABLED  Tobacco Use  . Smoking status: Current Every Day Smoker    Packs/day: 0.75    Years: 50.00    Pack years: 37.50    Types: Cigarettes  . Smokeless tobacco: Never Used  . Tobacco comment:  3/4 pk per day  Vaping Use  . Vaping Use: Never used  Substance and Sexual Activity  . Alcohol use: Yes    Alcohol/week: 0.0 standard drinks  . Drug use: Yes    Types: Marijuana  . Sexual activity: Not Currently    Birth control/protection: None  Other Topics Concern  . Not on file  Social History Narrative   Widowed in 2009.   Social Determinants of Health   Financial Resource Strain:   . Difficulty of Paying Living Expenses:   Food Insecurity:   . Worried About Charity fundraiser in the Last Year:   .  Ran Out of Food in the Last Year:   Transportation Needs:   . Film/video editor (Medical):   Marland Kitchen Lack of Transportation (Non-Medical):   Physical Activity:   . Days of Exercise per Week:   . Minutes of Exercise per Session:   Stress:   . Feeling of Stress :   Social Connections:   . Frequency of Communication with Friends and Family:   . Frequency of Social Gatherings with Friends and Family:   . Attends Religious Services:   . Active Member of Clubs or Organizations:   . Attends Archivist Meetings:   Marland Kitchen Marital Status:    Family History  Problem Relation Age of Onset  . Emphysema Mother   . Heart disease Mother        before age 77  . Hypertension Mother   . Hyperlipidemia Mother   . Heart attack Mother   . AAA (abdominal aortic aneurysm) Mother        rupture  . Heart attack Father 20  . Emphysema Father   . Heart disease Father        before age 89  . Hyperlipidemia Father   . Hypertension Father   . Peripheral vascular disease Father   . Diabetes Brother   . Heart disease Brother        before age 41  . Hyperlipidemia Brother   . Hypertension Brother   . Heart attack Brother        CABG  . Heart disease Other        Vascular disease in grandparents, uncles and dad  . Colon cancer Neg Hx   . Esophageal cancer Neg Hx   . Inflammatory bowel disease Neg Hx   . Liver disease Neg Hx   . Pancreatic cancer Neg Hx   . Rectal cancer Neg Hx     . Stomach cancer Neg Hx    Scheduled Meds: . (feeding supplement) PROSource Plus  30 mL Oral TID BM  . allopurinol  100 mg Oral QHS  . vitamin C  500 mg Oral BID  . atorvastatin  40 mg Oral QHS  . Chlorhexidine Gluconate Cloth  6 each Topical Q0600  . clopidogrel  75 mg Oral QHS  . collagenase   Topical Daily  . dronabinol  2.5 mg Oral BID AC  . epoetin (EPOGEN/PROCRIT) injection  4,000 Units Intravenous Q T,Th,Sa-HD  . feeding supplement  1 Container Oral TID BM  . gabapentin  300 mg Oral QHS  . heparin  5,000 Units Subcutaneous Q8H  . insulin aspart  0-5 Units Subcutaneous QHS  . insulin aspart  0-6 Units Subcutaneous TID WC  . lipase/protease/amylase  24,000 Units Oral TID  . metoprolol tartrate  12.5 mg Oral BID  . mirtazapine  15 mg Oral QHS  . multivitamin  1 tablet Oral QHS  . multivitamin-lutein  1 capsule Oral Daily  . nicotine  21 mg Transdermal Daily  . pantoprazole  40 mg Oral Daily  . sertraline  50 mg Oral Daily  . traZODone  150 mg Oral QHS   Continuous Infusions: . ampicillin-sulbactam (UNASYN) IV     PRN Meds:.acetaminophen **OR** acetaminophen, albuterol, alum & mag hydroxide-simeth, diphenhydrAMINE, HYDROmorphone (DILAUDID) injection, HYDROmorphone, midodrine, nitroGLYCERIN, ondansetron **OR** ondansetron (ZOFRAN) IV, promethazine Medications Prior to Admission:  Prior to Admission medications   Medication Sig Start Date End Date Taking? Authorizing Provider  albuterol (VENTOLIN HFA) 108 (90 Base) MCG/ACT inhaler Inhale 1-2 puffs into the lungs  every 6 (six) hours as needed for wheezing or shortness of breath.   Yes [provider]  allopurinol (ZYLOPRIM) 100 MG tablet Take 100 mg by mouth at bedtime.  02/02/17  Yes [provider]  atorvastatin (LIPITOR) 40 MG tablet Take 40 mg by mouth at bedtime.  12/07/17  Yes [provider]  B Complex-C-Zn-Folic Acid (DIALYVITE 250-NLZJ 15) 0.8 MG TABS Take 1 tablet by mouth daily. 03/31/20  Yes  [provider]  clopidogrel (PLAVIX) 75 MG tablet Take 75 mg by mouth at bedtime.  10/02/19  Yes [provider]  dronabinol (MARINOL) 2.5 MG capsule Take 2.5 mg by mouth 2 (two) times daily before a meal.   Yes [provider]  gabapentin (NEURONTIN) 300 MG capsule Take 1 capsule (300 mg total) by mouth at bedtime. 08/22/18  Yes Mikhail, Maryann, DO  HYDROmorphone (DILAUDID) 2 MG tablet Take 2-4 mg by mouth every 4 (four) hours.    Yes [provider]  lidocaine-prilocaine (EMLA) cream Apply 1 application topically 3 (three) times a week. 03/31/20  Yes [provider]  metoprolol tartrate (LOPRESSOR) 25 MG tablet Take 0.5 tablets (12.5 mg total) by mouth 2 (two) times daily. Do not take in the mornings when you have dialysis. 02/22/20 02/16/21 Yes Sherren Mocha, MD  midodrine (PROAMATINE) 10 MG tablet TAKE 1 TABLET BY MOUTH AS DIRECTED BEFORE HEMODIALYSIS. MAY REPEAT 1 TIME 03/20/20  Yes [provider]  nitroGLYCERIN (NITROSTAT) 0.4 MG SL tablet Place 1 tablet (0.4 mg total) under the tongue every 5 (five) minutes as needed for chest pain. 02/22/20  Yes Sherren Mocha, MD  OXYGEN Inhale 2 L into the lungs at bedtime.    Yes [provider]  Pancrelipase, Lip-Prot-Amyl, (ZENPEP) 20000-63000 units CPEP Take 1 capsule by mouth 3 (three) times daily. 01/24/20  Yes Lucious Groves, DO  pantoprazole (PROTONIX) 40 MG tablet Take 1 tablet (40 mg total) by mouth daily. 08/03/18  Yes Patrecia Pour, MD  promethazine (PHENERGAN) 12.5 MG tablet Take 12.5 mg by mouth every 4 (four) hours as needed for nausea or vomiting.   Yes [provider]  sertraline (ZOLOFT) 50 MG tablet Take 50 mg by mouth daily. 04/02/20  Yes [provider]  traZODone (DESYREL) 100 MG tablet Take 150 mg by mouth at bedtime.  04/19/19  Yes [provider]  diphenhydrAMINE (BENADRYL) 25 MG tablet Take 50 mg by mouth at bedtime. Patient not taking: Reported  on 04/20/2020    [provider]   Allergies  Allergen Reactions  . Isosorbide Other (See Comments)    Can only tolerate in low doses (unknown reaction) Other reaction(s): Other Unknown "FELT LIKE I WAS GOING TO EXPLODE FROM INSIDE OUT"  . Codeine Itching  . Contrast Media [Iodinated Diagnostic Agents] Itching and Other (See Comments)    Itching of feet  . Ioxaglate Itching and Other (See Comments)    Itching of feet  . Isosorbide Nitrate Other (See Comments)    Unknown   . Metrizamide Itching and Other (See Comments)    Itching of feet  . Oxycodone-Acetaminophen Itching  . Percocet [Oxycodone-Acetaminophen] Itching and Other (See Comments)    Tolerates Hydrocodone   Review of Systems  Constitutional: Positive for unexpected weight change.  Musculoskeletal: Positive for back pain.  Neurological: Positive for weakness.    Physical Exam Constitutional:      Appearance: She is cachectic. She is ill-appearing.  Cardiovascular:     Rate and Rhythm: Normal  rate.  Skin:    General: Skin is warm and dry.  Neurological:     Mental Status: She is alert.     Vital Signs: BP (!) 121/51 (BP Location: Right Arm)   Pulse 65   Temp 97.7 F (36.5 C) (Oral)   Resp 17   Ht 5\' 5"  (1.651 m)   Wt 36.7 kg   SpO2 100%   BMI 13.46 kg/m  Pain Scale: 0-10 POSS *See Group Information*: S-Acceptable,Sleep, easy to arouse Pain Score: 3    SpO2: SpO2: 100 % O2 Device:SpO2: 100 % O2 Flow Rate: .O2 Flow Rate (L/min): 2 L/min  IO: Intake/output summary:   Intake/Output Summary (Last 24 hours) at 04/22/2020 5188 Last data filed at 04/22/2020 0500 Gross per 24 hour  Intake 660 ml  Output --  Net 660 ml    LBM: Last BM Date: 04/22/20 Baseline Weight: Weight: 37.6 kg Most recent weight: Weight: 36.7 kg     Palliative Assessment/Data: 30 % at best   Discussed with Dr Louanne Belton and bedside RN  Time In: 0800 Time Out: 0910 Time Total: 70 minutes Greater than 50%  of this  time was spent counseling and coordinating care related to the above assessment and plan.  Signed by: Wadie Lessen, NP   Please contact Palliative Medicine Team phone at [phone] for questions and concerns.  For individual provider: See Shea Evans

## 2020-04-22 NOTE — TOC Initial Note (Signed)
Transition of Care [facility]) - Initial/Assessment Note    Patient Details  Name: [name] MRN: [id number] Date of Birth: [birth year]  Transition of [facility]) CM/SW Contact:    Candie Chroman, LCSW Phone Number: 04/22/2020, 10:16 AM  Clinical Narrative:   Readmission prevention screen complete. CSW met with patient. No supports at bedside. CSW introduced role and explained that discharge planning would be discussed. PCP is Bernerd Limbo, MD in [place]. Patient typically drives herself to appointments. She's unsure if she will be able to do so once she discharges but said her daughter can transport. Pharmacy is [facility] in [place]. No issues obtaining medications. Patient was active with [facility] for RN and aide prior to admission. Patient uses a rollator at home. She uses 2 L oxygen at bedtime at home. Oxygen supplier is [employer]. No further concerns. CSW encouraged patient to contact CSW as needed. CSW will continue to follow patient for support and facilitate return home when stable.                Expected Discharge Plan: [place] Barriers to Discharge: Continued Medical Work up   Patient Goals and CMS Choice     Choice offered to / list presented to : NA  Expected Discharge Plan and Services Expected Discharge Plan: [place] Acute Care Choice: Resumption of Svcs/PTA Provider Living arrangements for the past 2 months: Single Family Home                                      Prior Living Arrangements/Services Living arrangements for the past 2 months: Single Family Home   Patient language and need for interpreter reviewed:: Yes Do you feel safe going back to the place where you live?: Yes      Need for Family Participation in Patient Care: Yes (Comment)   Current home services: DME, Home RN, Homehealth aide Criminal Activity/Legal Involvement Pertinent to Current Situation/Hospitalization: No - Comment  as needed  Activities of Daily Living Home Assistive Devices/Equipment: Gilford Rile (specify type) ADL Screening (condition at time of admission) Patient's cognitive ability adequate to safely complete daily activities?: Yes Is the patient deaf or have difficulty hearing?: No Does the patient have difficulty seeing, even when wearing glasses/contacts?: No Does the patient have difficulty concentrating, remembering, or making decisions?: No Patient able to express need for assistance with ADLs?: Yes Does the patient have difficulty dressing or bathing?: No Independently performs ADLs?: Yes (appropriate for developmental age) Does the patient have difficulty walking or climbing stairs?: Yes Weakness of Legs: Both Weakness of Arms/Hands: None  Permission Sought/Granted Permission sought to share information with : Facility Art therapist granted to share information with : Yes, Verbal Permission Granted     Permission granted to share info w AGENCY: [facility]        Emotional Assessment Appearance:: Appears stated age Attitude/Demeanor/Rapport: Engaged, Gracious Affect (typically observed): Accepting, Appropriate, Calm, Pleasant Orientation: : Oriented to Self, Oriented to Place, Oriented to  Time, Oriented to Situation Alcohol / Substance Use: Not Applicable Psych Involvement: No (comment)  Admission diagnosis:  Infected wound [T14.8XXA, L08.9] Weakness [R53.1] Failure to thrive in adult [R62.7] Inadequate oral intake [R63.8] Acute cystitis with hematuria [N30.01] Wound of right buttock, initial encounter [S31.819A] Pressure injury of sacral region, stage 1 [L89.151] Patient Active Problem List  Diagnosis Date Noted  . Inadequate oral intake 04/20/2020  . UTI (urinary tract infection) 04/20/2020  . Infected wound 04/20/2020  . Unintentional weight loss 02/06/2020  . Generalized abdominal pain 02/06/2020  . Idiopathic chronic pancreatitis (HCC)  02/06/2020  . AV fistula occlusion (HCC) 01/22/2020  . AV fistula thrombosis, initial encounter (HCC) 01/19/2020  . AF (paroxysmal atrial fibrillation) (HCC) 01/19/2020  . Chronic respiratory failure with hypoxia (HCC) 01/19/2020  . Acute pancreatitis 07/26/2019  . Acute on chronic pancreatitis (HCC) 05/12/2019  . Abnormal CT of the abdomen 09/25/2018  . Pancreatic divisum 09/25/2018  . Pancreatic mass   . ESRD on hemodialysis (HCC) 09/09/2018  . Renal failure (ARF), acute on chronic (HCC) 08/15/2018  . ARF (acute renal failure) (HCC) 08/15/2018  . CKD (chronic kidney disease), stage V (HCC) 08/15/2018  . Hypomagnesemia 08/15/2018  . Acute pulmonary edema (HCC) 08/14/2018  . Goals of care, counseling/discussion   . DNR (do not resuscitate) discussion   . AKI (acute kidney injury) (HCC) 07/26/2018  . Acute encephalopathy 07/24/2018  . Chronic pain 07/24/2018  . Malnutrition of moderate degree 07/13/2018  . Stage II pressure ulcer (HCC) 07/12/2018  . Fluid collection at surgical site   . Intraabdominal fluid collection   . Cystic duct calculus   . RUQ pain 07/08/2018  . Peripancreatic fluid collection 07/04/2018  . 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 Urinary retention 07/04/2018  . S/P insertion of iliac artery stent 07/04/2018  . Endoleak post (EVAR) endovascular aneurysm repair (HCC) 07/04/2018  . Physical deconditioning   . Esophageal ulcer with bleeding   . Pressure injury, stage 4 (HCC) 06/16/2018  . Hematemesis   . Palliative care patient   . Palliative care by specialist   . Anasarca 06/07/2018  . Demand ischemia (HCC) 06/07/2018  . Severe protein-calorie malnutrition (HCC) 05/29/2018  . Type II diabetes mellitus with renal manifestations (HCC) 05/28/2018  . Recurrent pancreatitis 05/28/2018  . Hypokalemia 05/28/2018  . SIRS (systemic inflammatory response syndrome) (HCC) 05/28/2018  . Acute recurrent pancreatitis 05/28/2018  . Volume overload 04/17/2018  .  Anemia due to GI blood loss 04/14/2018  . Acute gallstone pancreatitis 04/14/2018  . Acute cholecystitis 04/14/2018  . Gastrointestinal hemorrhage   . Fatigue associated with anemia 04/09/2018  . Hypoglycemia 01/19/2018  . Type II diabetes mellitus (HCC) 01/19/2018  . Nausea with vomiting 01/19/2018  . Uncontrolled hypertension 01/19/2018  . Hyperlipidemia 01/19/2018  . CAD (coronary artery disease) 01/19/2018  . Chronic kidney disease (CKD), stage IV (severe) (HCC) 01/19/2018  . Retroperitoneal hematoma 01/19/2018  . Acute blood loss anemia 01/19/2018  . Chronic diastolic congestive heart failure (HCC) 01/19/2018  . AAA (abdominal aortic aneurysm) (HCC) 01/16/2018  . Elevated brain natriuretic peptide (BNP) level 10/07/2015  . Diabetes mellitus (HCC) 10/07/2015  . Pain in the chest   . PAD (peripheral artery disease) (HCC) 07/29/2015  . Chest pain 07/04/2015  . Acute renal failure superimposed on stage 4 chronic kidney disease (HCC) 07/04/2015  . GERD (gastroesophageal reflux disease) 07/04/2015  . Anemia 06/26/2015  . Coronary artery disease involving autologous vein coronary bypass graft with unstable angina pectoris (HCC) 06/24/2015  . Angina at rest [facility])   . Subclavian artery stenosis, left (HCC) 06/23/2015  . AAA (abdominal aortic aneurysm) without rupture (HCC) 12/10/2014  . Subclavian steal syndrome 12/10/2014  . Renal artery stenosis (HCC) 09/04/2013  . Carotid artery stenosis 09/28/2012  . Mitral valve disorder 06/23/2011  . S/P CABG x 3 06/16/2011  . S/P Maze operation for  atrial fibrillation 06/16/2011  . S/P MVR (mitral valve repair) 06/16/2011  . Follow-up examination following surgery 03/29/2011  . CKD (chronic kidney disease) 03/03/2011  . COPD with chronic bronchitis ([place]) 03/03/2011  . Chronic diastolic heart failure ([place]) 02/26/2011  . Hypotension 02/26/2011  . NSTEMI (non-ST elevated myocardial infarction) ([place]) 02/26/2011  . Long term (current) use of  anticoagulants 12/17/2010  . OBSTRUCTIVE SLEEP APNEA 09/04/2010  . HYPOXEMIA 09/04/2010  . SNORING 08/05/2010  . Atrial fibrillation ([place]) 12/09/2009  . WEIGHT LOSS 03/28/2009  . PALPITATIONS 03/28/2009  . PVD 11/21/2008  . HYPERLIPIDEMIA-MIXED 11/20/2008  . TOBACCO ABUSE 11/20/2008  . Essential hypertension 11/20/2008  . Coronary artery disease involving native heart with angina pectoris ([place]) 11/20/2008  . ABDOMINAL AORTIC ANEURYSM 11/20/2008   PCP:  Bernerd Limbo, MD Pharmacy:   [place], [place] - [address] [place]  [address] Phone: [phone] Fax: [place], [place] - [place] [address] Phone: [phone] Fax: [phone]     Social Determinants of Health (SDOH) Interventions    Readmission Risk Interventions Readmission Risk Prevention Plan 04/22/2020 01/24/2020 07/30/2019  Transportation Screening Complete Complete Complete  Medication Review ([place]) Complete Complete Referral to Pharmacy  PCP or Specialist appointment within 3-5 days of discharge Complete Complete Complete  HRI or Home Care Consult Complete Complete Complete  SW Recovery Care/Counseling Consult Complete Complete Complete  Palliative Care Screening Not Applicable Complete Not [place] Not Applicable Not Applicable Not Applicable  Some recent data might be hidden

## 2020-04-23 ENCOUNTER — Inpatient Hospital Stay: Payer: Medicare Other

## 2020-04-23 DIAGNOSIS — R627 Adult failure to thrive: Secondary | ICD-10-CM

## 2020-04-23 LAB — BASIC METABOLIC PANEL WITH GFR
Anion gap: 9 (ref 5–15)
BUN: 17 mg/dL (ref 8–23)
CO2: 26 mmol/L (ref 22–32)
Calcium: 7.9 mg/dL — ABNORMAL LOW (ref 8.9–10.3)
Chloride: 103 mmol/L (ref 98–111)
Creatinine, Ser: 2.33 mg/dL — ABNORMAL HIGH (ref 0.44–1.00)
GFR calc Af Amer: 24 mL/min — ABNORMAL LOW
GFR calc non Af Amer: 21 mL/min — ABNORMAL LOW
Glucose, Bld: 81 mg/dL (ref 70–99)
Potassium: 3.4 mmol/L — ABNORMAL LOW (ref 3.5–5.1)
Sodium: 138 mmol/L (ref 135–145)

## 2020-04-23 LAB — CBC WITH DIFFERENTIAL/PLATELET
Abs Immature Granulocytes: 0.05 K/uL (ref 0.00–0.07)
Basophils Absolute: 0 K/uL (ref 0.0–0.1)
Basophils Relative: 1 %
Eosinophils Absolute: 0 K/uL (ref 0.0–0.5)
Eosinophils Relative: 1 %
HCT: 27.6 % — ABNORMAL LOW (ref 36.0–46.0)
Hemoglobin: 9.4 g/dL — ABNORMAL LOW (ref 12.0–15.0)
Immature Granulocytes: 1 %
Lymphocytes Relative: 10 %
Lymphs Abs: 0.5 K/uL — ABNORMAL LOW (ref 0.7–4.0)
MCH: 35.3 pg — ABNORMAL HIGH (ref 26.0–34.0)
MCHC: 34.1 g/dL (ref 30.0–36.0)
MCV: 103.8 fL — ABNORMAL HIGH (ref 80.0–100.0)
Monocytes Absolute: 0.5 K/uL (ref 0.1–1.0)
Monocytes Relative: 9 %
Neutro Abs: 4.2 K/uL (ref 1.7–7.7)
Neutrophils Relative %: 78 %
Platelets: 120 K/uL — ABNORMAL LOW (ref 150–400)
RBC: 2.66 MIL/uL — ABNORMAL LOW (ref 3.87–5.11)
RDW: 14.8 % (ref 11.5–15.5)
WBC: 5.4 K/uL (ref 4.0–10.5)
nRBC: 0 % (ref 0.0–0.2)

## 2020-04-23 LAB — GLUCOSE, CAPILLARY
Glucose-Capillary: 80 mg/dL (ref 70–99)
Glucose-Capillary: 80 mg/dL (ref 70–99)
Glucose-Capillary: 65 mg/dL — ABNORMAL LOW (ref 70–99)
Glucose-Capillary: 131 mg/dL — ABNORMAL HIGH (ref 70–99)
Glucose-Capillary: 141 mg/dL — ABNORMAL HIGH (ref 70–99)

## 2020-04-23 LAB — HEPATITIS B SURFACE ANTIBODY, QUANTITATIVE: Hep B S AB Quant (Post): <3.1 m[IU]/mL — ABNORMAL LOW

## 2020-04-23 LAB — MAGNESIUM: Magnesium: 1.8 mg/dL (ref 1.7–2.4)

## 2020-04-23 MED ORDER — HYDROMORPHONE HCL 2 MG PO TABS: 4.0000 mg | ORAL_TABLET | Freq: Once | ORAL | Status: DC

## 2020-04-23 MED ORDER — POTASSIUM CHLORIDE CRYS ER 20 MEQ PO TBCR
20.0000 meq | EXTENDED_RELEASE_TABLET | Freq: Once | ORAL | Status: DC
  Filled 2020-04-23: qty 1

## 2020-04-23 MED ORDER — HYDROMORPHONE HCL 2 MG PO TABS
4.0000 mg | ORAL_TABLET | ORAL | Status: DC | PRN
  Administered 2020-04-23 – 2020-04-24 (×3): 4 mg via ORAL
  Filled 2020-04-23 (×3): qty 2

## 2020-04-23 NOTE — Care Management Important Message (Signed)
Important Message  Patient Details  Name: [name] MRN: [id number] Date of Birth: 10-25-1950   Medicare Important Message Given:  Yes     [place], [place] 04/23/2020, 11:45 AM

## 2020-04-23 NOTE — Evaluation (Signed)
Physical Therapy Evaluation Patient Details Name: [name] MRN: [id number] DOB: Dec 14, 1950 Today's Date: 04/23/2020   History of Present Illness  presented to ER secondary to decreased PO intake, progressive weakness and inability to get OOB/mobilize in home environment; admitted for management of inadequate oral intake, UTI and sacral decub (s/p bedside debridement)  Clinical Impression  Upon evaluation, patient alert and oriented; follows commands and demonstrates good effort with mobility tasks.  Patient generally frail and cachetic in appearance.  Reports moderate in buttocks/sacrum and R lateral malleolus due ot areas of breakdown.  Generally weak and deconditioned throughout all extremities, strength grossly 4-/5.  Currently requiring mod assist for bed mobility; min assist for sit/stand, basic transfers and gait (5' x2) with RW.  Generally poor balance, poor activity tolerance.  Unable to demonstrate ability to manage care and home environment indep at this time. Would benefit from skilled PT to address above deficits and promote optimal return to PLOF.; recommend transition to STR upon discharge from acute hospitalization.     Follow Up Recommendations SNF    Equipment Recommendations       Recommendations for Other Services       Precautions / Restrictions Precautions Precautions: Fall Precaution Comments: sacral decub, R lateral heel wound Restrictions Weight Bearing Restrictions: No      Mobility  Bed Mobility Overal bed mobility: Needs Assistance Bed Mobility: Sit to Supine       Sit to supine: Mod assist   General bed mobility comments: assist for LE elevation into bed due to pain in buttocks/sacrum with movement transition  Transfers Overall transfer level: Needs assistance Equipment used: Rolling walker (2 wheeled) Transfers: Sit to/from Stand Sit to Stand: Min assist            Ambulation/Gait Ambulation/Gait assistance: Min Management consultant (Feet):  (5' x2) Assistive device: Rolling walker (2 wheeled)       General Gait Details: decreased step height/length; forward flexed posture, mod WBng bilat LEs.  Limited balance, limited endurance.  Distance limited by toileting needs.  Stairs            Wheelchair Mobility    Modified Rankin (Stroke Patients Only)       Balance Overall balance assessment: Needs assistance Sitting-balance support: No upper extremity supported;Feet supported Sitting balance-Leahy Scale: Good     Standing balance support: Bilateral upper extremity supported Standing balance-Leahy Scale: Fair                               Pertinent Vitals/Pain Pain Assessment: Faces Faces Pain Scale: Hurts even more Pain Location: buttocks, R lateral malleolus Pain Descriptors / Indicators: Aching;Grimacing Pain Intervention(s): Limited activity within patient's tolerance;Monitored during session;Repositioned    Home Living Family/patient expects to be discharged to:: Private residence Living Arrangements: Alone Available Help at Discharge: Family;Available PRN/intermittently Type of Home: House Home Access: Ramped entrance     Home Layout: One level Home Equipment: [place] - 4 wheels;Bedside commode;Transport chair;Grab bars - tub/shower      Prior Function Level of Independence: Independent with assistive device(s)         Comments: Mod indep with ADLs, household mobilization with 4WRW; does endorse progressive decline in recent weeks     Hand Dominance   Dominant Hand: Right    Extremity/Trunk Assessment   Upper Extremity Assessment Upper Extremity Assessment: Generalized weakness (grossly 4-/5 throughout)    Lower Extremity Assessment Lower Extremity Assessment: Generalized  weakness (grossly 4-/5 throughout)       Communication   Communication: No difficulties  Cognition Arousal/Alertness: Awake/alert Behavior During Therapy: WFL for tasks  assessed/performed Overall Cognitive Status: Within Functional Limits for tasks assessed                                        General Comments      Exercises Other Exercises Other Exercises: Toilet transfer, SPT with RW, min assist; sit/stand from [facility] with RW, min assist.  Standing balance for hygiene, clothing management, min assist for excessive posterior weight shift Other Exercises: Positioned to L sidelying upon return to bed for pressure relief to sacrum, mod assist for positioning/comfort   Assessment/Plan    PT Assessment Patient needs continued PT services  PT Problem List Decreased strength;Decreased activity tolerance;Decreased balance;Decreased mobility;Decreased coordination;Decreased knowledge of use of DME;Decreased safety awareness;Decreased knowledge of precautions;Decreased skin integrity;Pain       PT Treatment Interventions Gait training;DME instruction;Functional mobility training;Therapeutic activities;Therapeutic exercise;Balance training;Patient/family education    PT Goals (Current goals can be found in the Care Plan section)  Acute Rehab PT Goals Patient Stated Goal: to get my strength back PT Goal Formulation: With patient Time For Goal Achievement: 05/07/20 Potential to Achieve Goals: Fair    Frequency Min 2X/week   Barriers to discharge        Co-evaluation               AM-PAC PT "6 Clicks" Mobility  Outcome Measure Help needed turning from your back to your side while in a flat bed without using bedrails?: A Lot Help needed moving from lying on your back to sitting on the side of a flat bed without using bedrails?: A Lot Help needed moving to and from a bed to a chair (including a wheelchair)?: A Little Help needed standing up from a chair using your arms (e.g., wheelchair or bedside chair)?: A Little Help needed to walk in hospital room?: A Little Help needed climbing 3-5 steps with a railing? : A Lot 6 Click Score:  15    End of Session   Activity Tolerance: Patient tolerated treatment well Patient left: in bed;with call bell/phone within reach;with bed alarm set Nurse Communication: Mobility status PT Visit Diagnosis: Muscle weakness (generalized) (M62.81);Difficulty in walking, not elsewhere classified (R26.2)    Time: 8127-5170 PT Time Calculation (min) (ACUTE ONLY): 21 min   Charges:   PT Evaluation $PT Eval Moderate Complexity: 1 Mod PT Treatments $Therapeutic Activity: 8-22 mins        Sherri Levenhagen H. Owens Shark, PT, DPT, NCS 04/23/20, 11:48 PM [phone]

## 2020-04-23 NOTE — Progress Notes (Signed)
PROGRESS NOTE    [name]  XVQ:[id number] DOB: 1951/04/22 DOA: 04/19/2020 PCP: Bernerd Limbo, MD   Brief Narrative:  Briefly this is a 69 year old female with extensive past medical history of ESRD on hemodialysis TTS, CAD status post CABG, status post mitral valve repair, atrial fibrillation on anticoagulation, chronic diastolic congestive heart failure, PAD, bilateral renal artery stenosis, severe protein calorie malnutrition, chronic pancreatitis, chronic narcotic use, COPD, hypertension, depression.  She presented to the emergency department yesterday with weakness, generalized pain, acute on chronic pain of the right buttock ulcer.  The patient's been followed by wound care clinic.  However the wound had worsened and pain worsened to the point that the patient presented to the emergency department.  She is also markedly malnourished weight has dropped to 37.6 kg.  BMI 13.8.  8/16: Patient seen and examined.  Seen by general surgery yesterday.  Status post wound debridement at bedside.  Procalcitonin elevated.  Remains on Unasyn.  Nephrology following.  No immediate plans for dialysis.  Patient complaining of pain this morning.  States that her home Dilaudid is ineffective consistently requiring requesting more.  Also had questions about PEG tube placement.  I explained that she would likely need a colonoscopy or endoscopic evaluation prior and that focus at this time would be on treatment of the wound and associated infection.  Once this is resolved we can consider referral to interventional radiology and gastroenterology for colonoscopy PEG tube placement.  8/17: Patient seen and examined.  Still with persistent pain in the right buttock and right ankle.  Procalcitonin decreasing.  White blood cell count decreasing.  Infectious focus slowly resolving.  Again endorsing insufficient pain control.  Again revisited PEG tube issue.  Explained that patient will likely need endoscopic evaluation  prior and at this time the focus would be on resolution of her infection.  She expressed understanding.   Assessment & Plan:   Principal Problem:   Inadequate oral intake Active Problems:   Essential hypertension   Atrial fibrillation (HCC)   Chronic diastolic heart failure (HCC)   COPD with chronic bronchitis (HCC)   S/P CABG x 3   Diabetes mellitus (HCC)   Chronic kidney disease (CKD), stage IV (severe) (HCC)   Severe protein-calorie malnutrition (HCC)   Pressure injury, stage 4 (HCC)   ESRD on hemodialysis ([place])   UTI (urinary tract infection)   Infected wound   Failure to thrive in adult  Infected right buttock wound Right ankle wound Patient presented with acute on chronic pain These wounds been present she is followed by the wound care clinic Recently had increased pain and increased foul-smelling drainage On admission had elevated procalcitonin was given Rocephin Plan: Continue Unasyn --d/c IV dilaudid. --continue home oral dilaudid PRN --wound care  Poor p.o. intake Severe protein calorie malnutrition Adult failure to thrive Patient has had a 2-year history of unintentional weight loss Endorses poor appetite's Was being worked up outpatient for a colonoscopy and potential PEG tube evaluation Plan: --continue marinol and Remeron  --Consider G-tube placement  ESRD on hemodialysis iHD per nephrology  Urinary tract infection Unclear whether this represents a true infection In any case patient is on Unasyn for infected wound  Chronic hypotension On midodrine  Atrial fibrillation Currently rate controlled Not on anticoagulants  Chronic diastolic congestive heart failure Currently euvolemic Cautious with any IV fluids  COPD with chronic bronchitis Not acutely exacerbated Continue home inhaler regimen  CAD status post CABG x3 No acute issues  Diabetes mellitus  Hold oral agents Sliding scale coverage  Chronic pain Chronic pancreatitis Continue  Creon --d/c IV dilaudid. --continue home oral dilaudid PRN    DVT prophylaxis: Heparin SQ Code Status: Full code  Family Communication: daughter updated at bedside today Status is: inpatient Dispo:   The patient is from: home Anticipated d/c is to: pending PT eval Anticipated d/c date is: 2-3 days Patient currently is not medically stable to d/c due to: Need G-tube placement for reliable nutrition and wound healing.    Consultants:   Nephrology  General surgery  Procedures:   Bedside wound debridement, 04/20/20  Antimicrobials:   Unasyn, 04/20/2020-   Subjective: Pt reported epigastric pain after swallowing, which was new today.  Reported lack of appetite.  Later pt said she only wanted IV pain meds.   Objective: Vitals:   04/22/20 1735 04/22/20 1939 04/23/20 0502 04/23/20 1145  BP: 134/61 139/64 (!) 131/59 (!) 118/51  Pulse: 60 92 64 73  Resp:  18 16   Temp:  98.5 F (36.9 C) 98.5 F (36.9 C) 98.6 F (37 C)  TempSrc:  Oral Oral Oral  SpO2:  100% 100% 100%  Weight:      Height:        Intake/Output Summary (Last 24 hours) at 04/23/2020 1935 Last data filed at 04/23/2020 0545 Gross per 24 hour  Intake 460 ml  Output 0 ml  Net 460 ml   Filed Weights   04/19/20 1218 04/20/20 2033  Weight: 37.6 kg 36.7 kg    Examination:  Constitutional: NAD, AAOx3 HEENT: conjunctivae and lids normal, EOMI CV: RRR no M,R,G. Distal pulses +2.  No cyanosis.   RESP: CTA B/L, normal respiratory effort  GI: +BS, NTND, soft Extremities: both feet in foam boots SKIN: warm, dry  Neuro: II - XII grossly intact.     Data Reviewed: I have personally reviewed following labs and imaging studies  CBC: Recent Labs  Lab 04/19/20 1219 04/20/20 2040 04/21/20 0345 04/22/20 0443 04/23/20 0455  WBC 9.5 10.0 8.2 7.1 5.4  NEUTROABS  --   --  6.8 5.8 4.2  HGB 10.7* 11.1* 10.1* 9.2* 9.4*  HCT 31.4* 32.4* 28.0* 26.7* 27.6*  MCV 102.6* 102.9* 98.6 102.7* 103.8*  PLT 152 176  152 122* 448*   Basic Metabolic Panel: Recent Labs  Lab 04/19/20 1219 04/20/20 2040 04/21/20 0345 04/22/20 0443 04/23/20 0455  NA 135  --  137 135 138  K 4.1  --  3.4* 3.3* 3.4*  CL 99  --  100 102 103  CO2 26  --  23 25 26   GLUCOSE 96  --  113* 93 81  BUN 27*  --  33* 33* 17  CREATININE 2.90* 3.77* 3.46* 3.34* 2.33*  CALCIUM 8.0*  --  7.6* 7.5* 7.9*  MG  --   --  1.8 2.1 1.8  PHOS  --   --   --  4.0  --    GFR: Estimated Creatinine Clearance: 13.6 mL/min (A) (by C-G formula based on SCr of 2.33 mg/dL (H)). Liver Function Tests: No results for input(s): AST, ALT, ALKPHOS, BILITOT, PROT, ALBUMIN in the last 168 hours. No results for input(s): LIPASE, AMYLASE in the last 168 hours. No results for input(s): AMMONIA in the last 168 hours. Coagulation Profile: No results for input(s): INR, PROTIME in the last 168 hours. Cardiac Enzymes: No results for input(s): CKTOTAL, CKMB, CKMBINDEX, TROPONINI in the last 168 hours. BNP (last 3 results) No results for input(s): PROBNP in  the last 8760 hours. HbA1C: Recent Labs    04/20/20 2040  HGBA1C 5.3   CBG: Recent Labs  Lab 04/22/20 2119 04/23/20 0811 04/23/20 1143 04/23/20 1706 04/23/20 1844  GLUCAP 84 80 80 65* 131*   Lipid Profile: No results for input(s): CHOL, HDL, LDLCALC, TRIG, CHOLHDL, LDLDIRECT in the last 72 hours. Thyroid Function Tests: No results for input(s): TSH, T4TOTAL, FREET4, T3FREE, THYROIDAB in the last 72 hours. Anemia Panel: No results for input(s): VITAMINB12, FOLATE, FERRITIN, TIBC, IRON, RETICCTPCT in the last 72 hours. Sepsis Labs: Recent Labs  Lab 04/20/20 2040 04/21/20 0345 04/22/20 0443  PROCALCITON 5.32 4.66 2.62    Recent Results (from the past 240 hour(s))  Urine culture     Status: Abnormal   Collection Time: 04/19/20 12:06 PM   Specimen: Urine, Random  Result Value Ref Range Status   Specimen Description   Final    URINE, RANDOM Performed at [facility], [address] [place], [place] [address]    Special Requests   Final    NONE Performed at [facility], [address] [place], [place] [address]    Culture (A)  Final    >=100,000 COLONIES/mL ESCHERICHIA COLI >=100,000 COLONIES/mL ENTEROCOCCUS FAECALIS    Report Status 04/22/2020 FINAL  Final   Organism ID, Bacteria ESCHERICHIA COLI (A)  Final   Organism ID, Bacteria ENTEROCOCCUS FAECALIS (A)  Final      Susceptibility   Escherichia coli - MIC*    AMPICILLIN >=32 RESISTANT Resistant     CEFAZOLIN <=4 SENSITIVE Sensitive     CEFTRIAXONE <=0.25 SENSITIVE Sensitive     CIPROFLOXACIN <=0.25 SENSITIVE Sensitive     GENTAMICIN <=1 SENSITIVE Sensitive     IMIPENEM <=0.25 SENSITIVE Sensitive     NITROFURANTOIN <=16 SENSITIVE Sensitive     TRIMETH/SULFA <=20 SENSITIVE Sensitive     AMPICILLIN/SULBACTAM >=32 RESISTANT Resistant     PIP/TAZO <=4 SENSITIVE Sensitive     * >=100,000 COLONIES/mL ESCHERICHIA COLI   Enterococcus faecalis - MIC*    AMPICILLIN <=2 SENSITIVE Sensitive     NITROFURANTOIN <=16 SENSITIVE Sensitive     VANCOMYCIN 2 SENSITIVE Sensitive     * >=100,000 COLONIES/mL ENTEROCOCCUS FAECALIS  SARS Coronavirus 2 by RT PCR (hospital order, performed in [place] hospital lab) Nasopharyngeal Nasopharyngeal Swab     Status: None   Collection Time: 04/20/20  1:46 AM   Specimen: Nasopharyngeal Swab  Result Value Ref Range Status   SARS Coronavirus 2 NEGATIVE NEGATIVE Final    Comment: (NOTE) SARS-CoV-2 target nucleic acids are NOT DETECTED.  The SARS-CoV-2 RNA is generally detectable in upper and lower respiratory specimens during the acute phase of infection. The lowest concentration of SARS-CoV-2 viral copies this assay can detect is 250 copies / mL. A negative result does not preclude SARS-CoV-2 infection and should not be used as the sole basis for treatment or other patient management decisions.  A negative result may occur with improper specimen collection  / handling, submission of specimen other than nasopharyngeal swab, presence of viral mutation(s) within the areas targeted by this assay, and inadequate number of viral copies (<250 copies / mL). A negative result must be combined with clinical observations, patient history, and epidemiological information.  Fact Sheet for Patients:   StrictlyIdeas.no  Fact Sheet for Healthcare Providers: BankingDealers.co.za  This test is not yet approved or  cleared by the [place] FDA and has been authorized for detection and/or diagnosis of SARS-CoV-2 by FDA under  an Emergency Use Authorization (EUA).  This EUA will remain in effect (meaning this test can be used) for the duration of the COVID-19 declaration under Section 564(b)(1) of the Act, 21 U.S.C. section 360bbb-3(b)(1), unless the authorization is terminated or revoked sooner.  Performed at [facility], [address], [place], [place] [address]          Radiology Studies: No results found.      Scheduled Meds: . (feeding supplement) PROSource Plus  30 mL Oral TID BM  . allopurinol  100 mg Oral QHS  . vitamin C  500 mg Oral BID  . atorvastatin  40 mg Oral QHS  . Chlorhexidine Gluconate Cloth  6 each Topical Q0600  . clopidogrel  75 mg Oral QHS  . collagenase   Topical Daily  . dronabinol  2.5 mg Oral BID AC  . epoetin (EPOGEN/PROCRIT) injection  4,000 Units Intravenous Q T,Th,Sa-HD  . feeding supplement  1 Container Oral TID BM  . gabapentin  300 mg Oral QHS  . heparin  5,000 Units Subcutaneous Q8H  . HYDROmorphone  4 mg Oral Once  . insulin aspart  0-5 Units Subcutaneous QHS  . insulin aspart  0-6 Units Subcutaneous TID WC  . lipase/protease/amylase  24,000 Units Oral TID  . metoprolol tartrate  12.5 mg Oral BID  . mirtazapine  15 mg Oral QHS  . multivitamin  1 tablet Oral QHS  . multivitamin-lutein  1 capsule Oral Daily  . nicotine  21 mg Transdermal  Daily  . pantoprazole  40 mg Oral Daily  . potassium chloride  20 mEq Oral Once  . sertraline  50 mg Oral Daily  . traZODone  150 mg Oral QHS   Continuous Infusions: . ampicillin-sulbactam (UNASYN) IV 3 g (04/23/20 1806)     LOS: 3 days     Enzo Bi, MD [facility] Pager [phone]  If 7PM-7AM, please contact night-coverage 04/23/2020, 7:35 PM

## 2020-04-23 NOTE — Progress Notes (Signed)
Patient ID: [name], female   DOB: [birth year], 69 y.o.   MRN: [id number]    This NP visited patient at the bedside/currently receiving dialysis treatment, as a follow up to  yesterday's [name].  Medical records reviewed  Patient has a complex medical situation significant for AAA (status post repair in May 2019), heart failure, CAD (on Plavix), atrial fibrillation (not on anticoagulation), chronic renal insufficiency (now on permanent hemodialysis), peripheral vascular disease (status post bypasses), carotid artery disease (status post CEA and left subclavian stent), on home O2 at night only, ongoing tobacco abuse, hypertension, diabetes, hyperlipidemia, status post cholecystectomy, severe idiopathic pancreatitis   She continues to decline physically and functionally.  Poor po intake, continued weight loss, skin breakdown, chronic pain, over all failure to thrive.  We re-addressed yesterdays conversation regarding her GOCs.  She ultimately hopes to return home, and regain her independence. She recognizes her physical and functional decline and need for more help.   Spoke with OP palliative NP/[name]  who [place], she has limited social support.  Her daughter  [name] is her main support person.  Patient reluctantly agrees that a SNF for rehabilitation on discharge is what she needs to do, but worries about not being able to smoke    Patient questions "why can't I just stay on the IV medicine until I get discharged, I did it before?"  Long discussion regarding pain management strategies and importance of converting to oral here in the hospital before discharge ensuring adequate pain control at home.  I worry that [name] has limited insight into the seriousness of her current medical situation and the long term prognosis.  There is discussion regarding consideration of PEG for nutritional support.  Education offered on concept specific to adult  failure to thrive and limitations of medical intervetnions to prolong quality of life when a body does fail to thrive.  Education offered on the importance of her discussing and documenting  her ACP wishes.  Discussed with patient the importance of continued conversation with her family and the  medical providers regarding overall plan of care and treatment options,  ensuring decisions are within the context of the patients values and GOCs.  Questions and concerns addressed    Total time spent on the unit was 60  minutes  Greater than 50% of the time was spent in counseling and coordination of care  Wadie Lessen NP  Palliative Medicine Team Team Phone # [phone] Pager [phone]

## 2020-04-23 NOTE — NC FL2 (Signed)
[place] LEVEL OF CARE SCREENING TOOL     IDENTIFICATION  Patient Name: [name] Birthdate: May 19, 1951 Sex: female Admission Date (Current Location): 04/19/2020  [place] and [place] Number:  Engineering geologist and Address:  [facility], [address] [place], [place] [address]      Provider Number:    Attending Physician Name and Address:  Enzo Bi, MD  Relative Name and Phone Number:  [name] [phone]    Current Level of Care: Hospital Recommended Level of Care: [place] Prior Approval Number:    Date Approved/Denied:   PASRR Number: [id number]  Discharge Plan: SNF    Current Diagnoses: Patient Active Problem List   Diagnosis Date Noted  . Failure to thrive in adult   . Inadequate oral intake 04/20/2020  . UTI (urinary tract infection) 04/20/2020  . Infected wound 04/20/2020  . Unintentional weight loss 02/06/2020  . Generalized abdominal pain 02/06/2020  . Idiopathic chronic pancreatitis ([place]) 02/06/2020  . AV fistula occlusion ([place]) 01/22/2020  . AV fistula thrombosis, initial encounter ([place]) 01/19/2020  . AF (paroxysmal atrial fibrillation) ([place]) 01/19/2020  . Chronic respiratory failure with hypoxia ([place]) 01/19/2020  . Acute pancreatitis 07/26/2019  . Acute on chronic pancreatitis ([place]) 05/12/2019  . Abnormal CT of the abdomen 09/25/2018  . Pancreatic divisum 09/25/2018  . Pancreatic mass   . ESRD on hemodialysis ([place]) 09/09/2018  . Renal failure (ARF), acute on chronic (HCC) 08/15/2018  . ARF (acute renal failure) ([place]) 08/15/2018  . CKD (chronic kidney disease), stage V ([place]) 08/15/2018  . Hypomagnesemia 08/15/2018  . Acute pulmonary edema ([place]) 08/14/2018  . Goals of care, counseling/discussion   . DNR (do not resuscitate) discussion   . AKI (acute kidney injury) ([place]) 07/26/2018  . Acute encephalopathy 07/24/2018  . Chronic pain 07/24/2018  . Malnutrition of moderate degree  07/13/2018  . Stage II pressure ulcer ([place]) 07/12/2018  . Fluid collection at surgical site   . Intraabdominal fluid collection   . Cystic duct calculus   . RUQ pain 07/08/2018  . Peripancreatic fluid collection 07/04/2018  . 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 Urinary retention 07/04/2018  . S/P insertion of iliac artery stent 07/04/2018  . Endoleak post (EVAR) endovascular aneurysm repair ([place]) 07/04/2018  . Physical deconditioning   . Esophageal ulcer with bleeding   . Pressure injury, stage 4 ([place]) 06/16/2018  . Hematemesis   . Palliative care patient   . Palliative care by specialist   . Anasarca 06/07/2018  . Demand ischemia ([place]) 06/07/2018  . Severe protein-calorie malnutrition ([place]) 05/29/2018  . Type II diabetes mellitus with renal manifestations ([place]) 05/28/2018  . Recurrent pancreatitis 05/28/2018  . Hypokalemia 05/28/2018  . SIRS (systemic inflammatory response syndrome) ([place]) 05/28/2018  . Acute recurrent pancreatitis 05/28/2018  . Volume overload 04/17/2018  . Anemia due to GI blood loss 04/14/2018  . Acute gallstone pancreatitis 04/14/2018  . Acute cholecystitis 04/14/2018  . Gastrointestinal hemorrhage   . Fatigue associated with anemia 04/09/2018  . Hypoglycemia 01/19/2018  . Type II diabetes mellitus ([place]) 01/19/2018  . Nausea with vomiting 01/19/2018  . Uncontrolled hypertension 01/19/2018  . Hyperlipidemia 01/19/2018  . CAD (coronary artery disease) 01/19/2018  . Chronic kidney disease (CKD), stage IV (severe) ([place]) 01/19/2018  . Retroperitoneal hematoma 01/19/2018  . Acute blood loss anemia 01/19/2018  . Chronic diastolic congestive heart failure ([place]) 01/19/2018  . AAA (abdominal aortic aneurysm) ([place]) 01/16/2018  . Elevated brain natriuretic peptide (BNP) level  10/07/2015  . Diabetes mellitus ([place]) 10/07/2015  . Pain in the chest   . PAD (peripheral artery disease) ([place]) 07/29/2015  . Chest pain 07/04/2015  . Acute renal failure superimposed  on stage 4 chronic kidney disease ([place]) 07/04/2015  . GERD (gastroesophageal reflux disease) 07/04/2015  . Anemia 06/26/2015  . Coronary artery disease involving autologous vein coronary bypass graft with unstable angina pectoris ([place]) 06/24/2015  . Angina at rest [facility])   . Subclavian artery stenosis, left ([place]) 06/23/2015  . AAA (abdominal aortic aneurysm) without rupture ([place]) 12/10/2014  . Subclavian steal syndrome 12/10/2014  . Renal artery stenosis ([place]) 09/04/2013  . Carotid artery stenosis 09/28/2012  . Mitral valve disorder 06/23/2011  . S/P CABG x 3 06/16/2011  . S/P Maze operation for atrial fibrillation 06/16/2011  . S/P MVR (mitral valve repair) 06/16/2011  . Follow-up examination following surgery 03/29/2011  . CKD (chronic kidney disease) 03/03/2011  . COPD with chronic bronchitis ([place]) 03/03/2011  . Chronic diastolic heart failure ([place]) 02/26/2011  . Hypotension 02/26/2011  . NSTEMI (non-ST elevated myocardial infarction) ([place]) 02/26/2011  . Long term (current) use of anticoagulants 12/17/2010  . OBSTRUCTIVE SLEEP APNEA 09/04/2010  . HYPOXEMIA 09/04/2010  . SNORING 08/05/2010  . Atrial fibrillation ([place]) 12/09/2009  . WEIGHT LOSS 03/28/2009  . PALPITATIONS 03/28/2009  . PVD 11/21/2008  . HYPERLIPIDEMIA-MIXED 11/20/2008  . TOBACCO ABUSE 11/20/2008  . Essential hypertension 11/20/2008  . Coronary artery disease involving native heart with angina pectoris ([place]) 11/20/2008  . ABDOMINAL AORTIC ANEURYSM 11/20/2008    Orientation RESPIRATION BLADDER Height & Weight     Self, Time, Situation, Place    Incontinent Weight: 80 lb 14.5 oz (36.7 kg) Height:  5\' 5"  (165.1 cm)  BEHAVIORAL SYMPTOMS/MOOD NEUROLOGICAL BOWEL NUTRITION STATUS      Incontinent    AMBULATORY STATUS COMMUNICATION OF NEEDS Skin   Extensive Assist Verbally                         Personal Care Assistance Level of Assistance  Bathing, Feeding, Dressing, Total care Bathing Assistance: Limited  assistance Feeding assistance: Limited assistance Dressing Assistance: Limited assistance Total Care Assistance: Limited assistance   Functional Limitations Info             SPECIAL CARE FACTORS FREQUENCY  PT (By licensed PT), OT (By licensed OT)     PT Frequency: 5 x weekly OT Frequency: 5 x weekly            Contractures      Additional Factors Info  Code Status Code Status Info: Full             Current Medications (04/23/2020):  This is the current hospital active medication list Current Facility-Administered Medications  Medication Dose Route Frequency Provider Last Rate Last Admin  . (feeding supplement) PROSource Plus liquid 30 mL  30 mL Oral TID BM Sreenath, Sudheer B, MD   30 mL at 04/23/20 1006  . acetaminophen (TYLENOL) tablet 650 mg  650 mg Oral Q6H PRN Athena Masse, MD       Or  . acetaminophen (TYLENOL) suppository 650 mg  650 mg Rectal Q6H PRN Athena Masse, MD      . albuterol (PROVENTIL) (2.5 MG/3ML) 0.083% nebulizer solution 3 mL  3 mL Inhalation Q6H PRN Sreenath, Sudheer B, MD      . allopurinol (ZYLOPRIM) tablet 100 mg  100 mg Oral QHS Sidney Ace, MD   100  mg at 04/22/20 2143  . alum & mag hydroxide-simeth (MAALOX/MYLANTA) 200-200-20 MG/5ML suspension 15 mL  15 mL Oral Q4H PRN Ralene Muskrat B, MD   15 mL at 04/22/20 2007  . Ampicillin-Sulbactam (UNASYN) 3 g in sodium chloride 0.9 % 100 mL IVPB  3 g Intravenous Q24H Dallie Piles, [facility]   Stopped at 04/22/20 1903  . ascorbic acid (VITAMIN C) tablet 500 mg  500 mg Oral BID Ralene Muskrat B, MD   500 mg at 04/23/20 1004  . atorvastatin (LIPITOR) tablet 40 mg  40 mg Oral QHS Ralene Muskrat B, MD   40 mg at 04/22/20 2142  . Chlorhexidine Gluconate Cloth 2 % PADS 6 each  6 each Topical Q0600 Murlean Iba, MD      . clopidogrel (PLAVIX) tablet 75 mg  75 mg Oral QHS Ralene Muskrat B, MD   75 mg at 04/22/20 2143  . collagenase (SANTYL) ointment   Topical Daily Ronny Bacon, MD    Given at 04/23/20 1013  . diphenhydrAMINE (BENADRYL) capsule 50 mg  50 mg Oral QHS PRN Sharion Settler, NP   50 mg at 04/22/20 2141  . dronabinol (MARINOL) capsule 2.5 mg  2.5 mg Oral BID AC Sreenath, Sudheer B, MD   2.5 mg at 04/23/20 1004  . epoetin alfa (EPOGEN) injection 4,000 Units  4,000 Units Intravenous Q T,Th,Sa-HD Murlean Iba, MD   4,000 Units at 04/22/20 1647  . feeding supplement (BOOST / RESOURCE BREEZE) liquid 1 Container  1 Container Oral TID BM Ralene Muskrat B, MD   1 Container at 04/23/20 1003  . gabapentin (NEURONTIN) capsule 300 mg  300 mg Oral QHS Sreenath, Sudheer B, MD   300 mg at 04/22/20 2143  . heparin injection 5,000 Units  5,000 Units Subcutaneous Q8H Athena Masse, MD   5,000 Units at 04/23/20 0505  . HYDROmorphone (DILAUDID) injection 1.5 mg  1.5 mg Intravenous Q3H PRN Ralene Muskrat B, MD   1.5 mg at 04/23/20 1004  . HYDROmorphone (DILAUDID) tablet 4 mg  4 mg Oral Q3H PRN Ralene Muskrat B, MD   4 mg at 04/22/20 1828  . insulin aspart (novoLOG) injection 0-5 Units  0-5 Units Subcutaneous QHS Judd Gaudier V, MD      . insulin aspart (novoLOG) injection 0-6 Units  0-6 Units Subcutaneous TID WC Athena Masse, MD      . lipase/protease/amylase (CREON) capsule 24,000 Units  24,000 Units Oral TID Ralene Muskrat B, MD   24,000 Units at 04/23/20 1004  . metoprolol tartrate (LOPRESSOR) tablet 12.5 mg  12.5 mg Oral BID Ralene Muskrat B, MD   12.5 mg at 04/23/20 1004  . midodrine (PROAMATINE) tablet 10 mg  10 mg Oral Q dialysis Sreenath, Sudheer B, MD      . mirtazapine (REMERON) tablet 15 mg  15 mg Oral QHS Ralene Muskrat B, MD   15 mg at 04/22/20 2143  . multivitamin (RENA-VIT) tablet 1 tablet  1 tablet Oral QHS Ralene Muskrat B, MD   1 tablet at 04/22/20 2141  . multivitamin-lutein (OCUVITE-LUTEIN) capsule 1 capsule  1 capsule Oral Daily Ralene Muskrat B, MD   1 capsule at 04/22/20 1828  . nicotine (NICODERM CQ - dosed in mg/24 hours) patch 21  mg  21 mg Transdermal Daily Priscella Mann, Sudheer B, MD   21 mg at 04/23/20 1015  . nitroGLYCERIN (NITROSTAT) SL tablet 0.4 mg  0.4 mg Sublingual Q5 min PRN Sidney Ace, MD      .  ondansetron (ZOFRAN) tablet 4 mg  4 mg Oral Q6H PRN Athena Masse, MD       Or  . ondansetron [facility]) injection 4 mg  4 mg Intravenous Q6H PRN Athena Masse, MD   4 mg at 04/20/20 1705  . pantoprazole (PROTONIX) EC tablet 40 mg  40 mg Oral Daily Ralene Muskrat B, MD   40 mg at 04/23/20 1004  . promethazine (PHENERGAN) tablet 12.5 mg  12.5 mg Oral Q4H PRN Sreenath, Sudheer B, MD      . sertraline (ZOLOFT) tablet 50 mg  50 mg Oral Daily Priscella Mann, Sudheer B, MD   50 mg at 04/23/20 1004  . traZODone (DESYREL) tablet 150 mg  150 mg Oral QHS Ralene Muskrat B, MD   150 mg at 04/22/20 2141     Discharge Medications: Please see discharge summary for a list of discharge medications.  Relevant Imaging Results:  Relevant Lab Results:   Additional Information SS#: [id number]  Meriel Flavors, LCSW

## 2020-04-23 NOTE — TOC Progression Note (Addendum)
Transition of Care [facility]) - Progression Note    Patient Details  Name: [name] MRN: [id number] Date of Birth: 07-Jan-1951  Transition of Care [facility]) CM/SW Contact  Meriel Flavors, LCSW Phone Number: 04/23/2020, 6:32 PM  Clinical Narrative:    CSW spoke with daughter, [name] on the phone concerning discharge plan. [name] expressed great concern about her mother being sent home alone in her present condition but said she was sure patient would refuse SNF. CSW met with patient and discussed why she is refusing rehab. Patient stated she refused because she can't smoke if she goes to a SNF. CSW explained the risk for going home without rehab and ask if Dr. would prescribe nicotine patches would she be willing to go, patient agreed to go if she has patches. Patient has been to [facility] in [place] before and was okay with it. She has no preference other than that. [name] does not want her at [facility], no other pereference. CSW completed work-up and sent out request for bed offers. HAD to wait for PT/OT eval before requesting Auth  Expected Discharge Plan: [place] Barriers to Discharge: Continued Medical Work up  Expected Discharge Plan and Services Expected Discharge Plan: [place] Acute Care Choice: Resumption of Svcs/PTA Provider Living arrangements for the past 2 months: Single Family Home                                       Social Determinants of Health (SDOH) Interventions    Readmission Risk Interventions Readmission Risk Prevention Plan 04/23/2020 04/22/2020 01/24/2020  Transportation Screening - Complete Complete  Medication Review Press photographer) - Complete Complete  PCP or Specialist appointment within 3-5 days of discharge - Complete Complete  HRI or Home Care Consult - Complete Complete  SW Recovery Care/Counseling Consult - Complete Complete  Palliative Care Screening Complete Not Applicable Complete   Skilled Nursing Facility Complete Not Applicable Not Applicable  Some recent data might be hidden

## 2020-04-23 NOTE — Progress Notes (Signed)
Mobility Specialist - Progress Note   04/23/20 1236  Mobility  Activity Transferred:  Bed to chair;Ambulated in room  [place] wheel walker  Distance Ambulated (ft) 20 ft  Mobility Response Tolerated well  Mobility performed by Mobility specialist  $Mobility charge 1 Mobility    Pre-mobility: 80 HR, 124/35 BP, 96% SpO2   Pt laying in bed upon arrival. Pt agreed to mobility session this AM. Pt stated she was still having pain in her R buttock region and R ankle, however, she was having new pain in her chest. Pt states she feels "a lump going up and down my chest". Nurse was notified immediately. Per discussion w/ nurse, mobility appropriate to continue. Pt needed min. Assist throughout session. Pt transferred to chair utilizing RW. Pt , then progressed, to ambulate ~20 ft total in room. Pt tolerated session well. Pt left in recliner with prevalon boots on, talking to her doctor. Call bell and phone were placed in reach.   Demetris Meinhardt Mobility Specialist  04/23/20, 12:44 PM

## 2020-04-23 NOTE — Progress Notes (Signed)
[facility], [place] 04/23/20  Subjective:   LOS: 3  Patient is doing fair Denies any acute complaints Reports pain in mid epigastric area with swallowing Asked her to avoid caffeine and carbonated drinks  Objective:  Vital signs in last 24 hours:  Temp:  [98.5 F (36.9 C)-98.6 F (37 C)] 98.6 F (37 C) (08/18 1145) Pulse Rate:  [58-92] 73 (08/18 1145) Resp:  [16-18] 16 (08/18 0502) BP: (91-139)/(47-64) 118/51 (08/18 1145) SpO2:  [100 %] 100 % (08/18 1145)  Weight change:  Filed Weights   04/19/20 1218 04/20/20 2033  Weight: 37.6 kg 36.7 kg    Intake/Output:    Intake/Output Summary (Last 24 hours) at 04/23/2020 1419 Last data filed at 04/23/2020 0545 Gross per 24 hour  Intake 460 ml  Output 0 ml  Net 460 ml    Physical Exam: General:  No acute distress, sitting up in recliner chair, thin cachectic  HEENT  anicteric, moist oral mucous membrane  Pulm/lungs  normal breathing effort, lungs are clear to auscultation  CVS/Heart  no rub or gallop, irregular rhythm  Abdomen:   Soft, nontender  Extremities:  Trace to 1+ peripheral edema  Neurologic:  Alert, oriented, able to follow commands  Skin:  No acute rashes  Left arm AV fistula    Basic Metabolic Panel:  Recent Labs  Lab 04/19/20 1219 04/19/20 1219 04/20/20 2040 04/21/20 0345 04/22/20 0443 04/23/20 0455  NA 135  --   --  137 135 138  K 4.1  --   --  3.4* 3.3* 3.4*  CL 99  --   --  100 102 103  CO2 26  --   --  23 25 26   GLUCOSE 96  --   --  113* 93 81  BUN 27*  --   --  33* 33* 17  CREATININE 2.90*  --  3.77* 3.46* 3.34* 2.33*  CALCIUM 8.0*   < >  --  7.6* 7.5* 7.9*  MG  --   --   --  1.8 2.1 1.8  PHOS  --   --   --   --  4.0  --    < > = values in this interval not displayed.     CBC: Recent Labs  Lab 04/19/20 1219 04/20/20 2040 04/21/20 0345 04/22/20 0443 04/23/20 0455  WBC 9.5 10.0 8.2 7.1 5.4  NEUTROABS  --   --  6.8 5.8 4.2  HGB 10.7* 11.1* 10.1* 9.2*  9.4*  HCT 31.4* 32.4* 28.0* 26.7* 27.6*  MCV 102.6* 102.9* 98.6 102.7* 103.8*  PLT 152 176 152 122* 120*      Lab Results  Component Value Date   HEPBSAG NON REACTIVE 04/22/2020   HEPBSAB Non Reactive 07/27/2018      Microbiology:  Recent Results (from the past 240 hour(s))  Urine culture     Status: Abnormal   Collection Time: 04/19/20 12:06 PM   Specimen: Urine, Random  Result Value Ref Range Status   Specimen Description   Final    URINE, RANDOM Performed at [facility], [address] [place], [place] [address]    Special Requests   Final    NONE Performed at [facility], [address] [place], [place] [address]    Culture (A)  Final    >=100,000 COLONIES/mL ESCHERICHIA COLI >=100,000 COLONIES/mL ENTEROCOCCUS FAECALIS    Report Status 04/22/2020 FINAL  Final   Organism ID, Bacteria ESCHERICHIA COLI (A)  Final  Organism ID, Bacteria ENTEROCOCCUS FAECALIS (A)  Final      Susceptibility   Escherichia coli - MIC*    AMPICILLIN >=32 RESISTANT Resistant     CEFAZOLIN <=4 SENSITIVE Sensitive     CEFTRIAXONE <=0.25 SENSITIVE Sensitive     CIPROFLOXACIN <=0.25 SENSITIVE Sensitive     GENTAMICIN <=1 SENSITIVE Sensitive     IMIPENEM <=0.25 SENSITIVE Sensitive     NITROFURANTOIN <=16 SENSITIVE Sensitive     TRIMETH/SULFA <=20 SENSITIVE Sensitive     AMPICILLIN/SULBACTAM >=32 RESISTANT Resistant     PIP/TAZO <=4 SENSITIVE Sensitive     * >=100,000 COLONIES/mL ESCHERICHIA COLI   Enterococcus faecalis - MIC*    AMPICILLIN <=2 SENSITIVE Sensitive     NITROFURANTOIN <=16 SENSITIVE Sensitive     VANCOMYCIN 2 SENSITIVE Sensitive     * >=100,000 COLONIES/mL ENTEROCOCCUS FAECALIS  SARS Coronavirus 2 by RT PCR (hospital order, performed in [facility] lab) Nasopharyngeal Nasopharyngeal Swab     Status: None   Collection Time: 04/20/20  1:46 AM   Specimen: Nasopharyngeal Swab  Result Value Ref Range Status   SARS Coronavirus 2 NEGATIVE  NEGATIVE Final    Comment: (NOTE) SARS-CoV-2 target nucleic acids are NOT DETECTED.  The SARS-CoV-2 RNA is generally detectable in upper and lower respiratory specimens during the acute phase of infection. The lowest concentration of SARS-CoV-2 viral copies this assay can detect is 250 copies / mL. A negative result does not preclude SARS-CoV-2 infection and should not be used as the sole basis for treatment or other patient management decisions.  A negative result may occur with improper specimen collection / handling, submission of specimen other than nasopharyngeal swab, presence of viral mutation(s) within the areas targeted by this assay, and inadequate number of viral copies (<250 copies / mL). A negative result must be combined with clinical observations, patient history, and epidemiological information.  Fact Sheet for Patients:   StrictlyIdeas.no  Fact Sheet for Healthcare Providers: BankingDealers.co.za  This test is not yet approved or  cleared by the [place] FDA and has been authorized for detection and/or diagnosis of SARS-CoV-2 by FDA under an Emergency Use Authorization (EUA).  This EUA will remain in effect (meaning this test can be used) for the duration of the COVID-19 declaration under Section 564(b)(1) of the Act, 21 U.S.C. section 360bbb-3(b)(1), unless the authorization is terminated or revoked sooner.  Performed at [facility], [address] [place], [place] [address]     Coagulation Studies: No results for input(s): LABPROT, INR in the last 72 hours.  Urinalysis: No results for input(s): COLORURINE, LABSPEC, PHURINE, GLUCOSEU, HGBUR, BILIRUBINUR, KETONESUR, PROTEINUR, UROBILINOGEN, NITRITE, LEUKOCYTESUR in the last 72 hours.  Invalid input(s): APPERANCEUR    Imaging: No results found.   Medications:   . ampicillin-sulbactam (UNASYN) IV Stopped (04/22/20 1903)   . (feeding  supplement) PROSource Plus  30 mL Oral TID BM  . allopurinol  100 mg Oral QHS  . vitamin C  500 mg Oral BID  . atorvastatin  40 mg Oral QHS  . Chlorhexidine Gluconate Cloth  6 each Topical Q0600  . clopidogrel  75 mg Oral QHS  . collagenase   Topical Daily  . dronabinol  2.5 mg Oral BID AC  . epoetin (EPOGEN/PROCRIT) injection  4,000 Units Intravenous Q T,Th,Sa-HD  . feeding supplement  1 Container Oral TID BM  . gabapentin  300 mg Oral QHS  . heparin  5,000 Units Subcutaneous Q8H  . insulin aspart  0-5  Units Subcutaneous QHS  . insulin aspart  0-6 Units Subcutaneous TID WC  . lipase/protease/amylase  24,000 Units Oral TID  . metoprolol tartrate  12.5 mg Oral BID  . mirtazapine  15 mg Oral QHS  . multivitamin  1 tablet Oral QHS  . multivitamin-lutein  1 capsule Oral Daily  . nicotine  21 mg Transdermal Daily  . pantoprazole  40 mg Oral Daily  . potassium chloride  20 mEq Oral Once  . sertraline  50 mg Oral Daily  . traZODone  150 mg Oral QHS   acetaminophen **OR** acetaminophen, albuterol, alum & mag hydroxide-simeth, diphenhydrAMINE, HYDROmorphone (DILAUDID) injection, HYDROmorphone, midodrine, nitroGLYCERIN, ondansetron **OR** ondansetron (ZOFRAN) IV, promethazine  Assessment/ Plan:  69 y.o. female with medical problems of ESRD, on hemodialysis, CAD/CABG 03/2011, COPD, atrial fibrillation, hypertension, COPD, chronic pancreatitis and chronic abdominal pain requiring recurrent hospitalizations , h/o AAA, cholecystectomy, severe peripheral arterial disease including bilateral renal artery stent, subclavian artery stenosis, left, left CEA  was admitted on 04/19/2020 for  Principal Problem:   Inadequate oral intake Active Problems:   Essential hypertension   Atrial fibrillation (HCC)   Chronic diastolic heart failure (HCC)   COPD with chronic bronchitis (HCC)   S/P CABG x 3   Diabetes mellitus (HCC)   Chronic kidney disease (CKD), stage IV (severe) (HCC)   Severe protein-calorie  malnutrition (HCC)   Pressure injury, stage 4 (HCC)   ESRD on hemodialysis ([place])   UTI (urinary tract infection)   Infected wound   Failure to thrive in adult  Infected wound [T14.8XXA, L08.9] Weakness [R53.1] Failure to thrive in adult [R62.7] Inadequate oral intake [R63.8] Acute cystitis with hematuria [N30.01] Wound of right buttock, initial encounter [S31.819A] Pressure injury of sacral region, stage 1 [facility]/TTS/left arm AV fistula/CK [place]  #. ESRD HD to be scheduled for tomorrow Keep TTS schedule Regular diet to encourage intake  #. Anemia of CKD  Lab Results  Component Value Date   HGB 9.4 (L) 04/23/2020   Low dose EPO with HD  #. Secondary hyperparathyroidism of renal origin N 25.81      Component Value Date/Time   PTH 80 (H) 08/15/2018 1435   Lab Results  Component Value Date   PHOS 4.0 04/22/2020   Monitor calcium and phos level during this admission   #.  Rt Buttock wound Eval by wound care nurse S/p I & D rt ischial decub ulcer Concern about severe protein calorie malnutrition Getting oral protein supplements  #Urinary tract infection E. coli and Enterococcus faecalis Currently being treated with IV Unasyn   LOS: [place] 8/18/20212:19 [place], [place]

## 2020-04-23 NOTE — TOC Progression Note (Signed)
Transition of Care [facility]) - Progression Note    Patient Details  Name: [name] MRN: [id number] Date of Birth: 1951-01-30  Transition of Care [facility]) CM/SW Contact  Meriel Flavors, LCSW Phone Number: 04/23/2020, 12:08 PM  Clinical Narrative:    CSW met with patient to discuss discharge plan. Patient is recommended for SNF, Patient said if doctor would prescribe nicotine patches she will go to SNF for rehab. CSW is completing work-up now for SNF placement   Expected Discharge Plan: [place] Barriers to Discharge: Continued Medical Work up  Expected Discharge Plan and Services Expected Discharge Plan: [place] Acute Care Choice: Resumption of Svcs/PTA Provider Living arrangements for the past 2 months: Single Family Home                                       Social Determinants of Health (SDOH) Interventions    Readmission Risk Interventions Readmission Risk Prevention Plan 04/22/2020 01/24/2020 07/30/2019  Transportation Screening Complete Complete Complete  Medication Review Press photographer) Complete Complete Referral to Pharmacy  PCP or Specialist appointment within 3-5 days of discharge Complete Complete Complete  HRI or Home Care Consult Complete Complete Complete  SW Recovery Care/Counseling Consult Complete Complete Complete  Palliative Care Screening Not Applicable Complete Not [place] Not Applicable Not Applicable Not Applicable  Some recent data might be hidden

## 2020-04-24 DIAGNOSIS — R531 Weakness: Secondary | ICD-10-CM

## 2020-04-24 DIAGNOSIS — L89151 Pressure ulcer of sacral region, stage 1: Secondary | ICD-10-CM

## 2020-04-24 LAB — MAGNESIUM: Magnesium: 2.1 mg/dL (ref 1.7–2.4)

## 2020-04-24 LAB — BASIC METABOLIC PANEL
Anion gap: 10 (ref 5–15)
BUN: 19 mg/dL (ref 8–23)
CO2: 26 mmol/L (ref 22–32)
Calcium: 7.9 mg/dL — ABNORMAL LOW (ref 8.9–10.3)
Chloride: 105 mmol/L (ref 98–111)
Creatinine, Ser: 2.63 mg/dL — ABNORMAL HIGH (ref 0.44–1.00)
GFR calc Af Amer: 21 mL/min — ABNORMAL LOW (ref >60)
GFR calc non Af Amer: 18 mL/min — ABNORMAL LOW (ref >60)
Glucose, Bld: 92 mg/dL (ref 70–99)
Potassium: 3.6 mmol/L (ref 3.5–5.1)
Sodium: 141 mmol/L (ref 135–145)

## 2020-04-24 LAB — GLUCOSE, CAPILLARY
Glucose-Capillary: 71 mg/dL (ref 70–99)
Glucose-Capillary: 73 mg/dL (ref 70–99)
Glucose-Capillary: 60 mg/dL — ABNORMAL LOW (ref 70–99)
Glucose-Capillary: 114 mg/dL — ABNORMAL HIGH (ref 70–99)
Glucose-Capillary: 137 mg/dL — ABNORMAL HIGH (ref 70–99)

## 2020-04-24 LAB — CBC
HCT: 26.3 % — ABNORMAL LOW (ref 36.0–46.0)
Hemoglobin: 8.8 g/dL — ABNORMAL LOW (ref 12.0–15.0)
MCH: 34.6 pg — ABNORMAL HIGH (ref 26.0–34.0)
MCHC: 33.5 g/dL (ref 30.0–36.0)
MCV: 103.5 fL — ABNORMAL HIGH (ref 80.0–100.0)
Platelets: 127 10*3/uL — ABNORMAL LOW (ref 150–400)
RBC: 2.54 MIL/uL — ABNORMAL LOW (ref 3.87–5.11)
RDW: 14.8 % (ref 11.5–15.5)
WBC: 5.7 10*3/uL (ref 4.0–10.5)
nRBC: 0 % (ref 0.0–0.2)

## 2020-04-24 LAB — ALBUMIN: Albumin: 1.6 g/dL — ABNORMAL LOW (ref 3.5–5.0)

## 2020-04-24 LAB — PHOSPHORUS: Phosphorus: 2.9 mg/dL (ref 2.5–4.6)

## 2020-04-24 MED ORDER — KCL IN DEXTROSE-NACL 40-5-0.45 MEQ/L-%-% IV SOLN
INTRAVENOUS | Status: AC
  Filled 2020-04-24: qty 1000

## 2020-04-24 MED ORDER — HYDROMORPHONE HCL 2 MG PO TABS
6.0000 mg | ORAL_TABLET | ORAL | Status: DC | PRN
  Administered 2020-04-24 – 2020-04-25 (×6): 6 mg via ORAL
  Filled 2020-04-24 (×6): qty 3

## 2020-04-24 MED ORDER — SENNOSIDES-DOCUSATE SODIUM 8.6-50 MG PO TABS
1.0000 | ORAL_TABLET | Freq: Two times a day (BID) | ORAL | Status: DC
  Administered 2020-04-24 – 2020-05-02 (×12): 1 via ORAL
  Filled 2020-04-24 (×14): qty 1

## 2020-04-24 NOTE — Progress Notes (Signed)
Mobility Specialist - Progress Note   04/24/20 1349  Mobility  Activity  (Cancellation)  Mobility performed by Mobility specialist     Per discussion w/ nurse, pt receiving HD treatment this AM. Will attempt session again at later date/time when appropriate.    Ricco Dershem Mobility Specialist  04/24/20, 1:50 PM

## 2020-04-24 NOTE — Progress Notes (Signed)
[facility], [place] 04/24/20  Subjective:   LOS: 4  Patient seen in dialysis today, tolerating treatment well. Reports chronic pain from her right buttock ulcer    HEMODIALYSIS FLOWSHEET:  Blood Flow Rate (mL/min): 200 mL/min Arterial Pressure (mmHg): -30 mmHg Venous Pressure (mmHg): 10 mmHg Transmembrane Pressure (mmHg): 30 mmHg Ultrafiltration Rate (mL/min): 70 mL/min Dialysate Flow Rate (mL/min): 600 ml/min Conductivity: Machine : 14.2 Conductivity: Machine : 14.2 Dialysis Fluid Bolus: Normal Saline Bolus Amount (mL): 250 mL    Objective:  Vital signs in last 24 hours:  Temp:  [98.2 F (36.8 C)-98.7 F (37.1 C)] 98.2 F (36.8 C) (08/19 1015) Pulse Rate:  [61-78] 66 (08/19 1330) Resp:  [9-18] 15 (08/19 1330) BP: (94-140)/(42-60) 115/49 (08/19 1330) SpO2:  [95 %-100 %] 95 % (08/19 1015)  Weight change:  Filed Weights   04/19/20 1218 04/20/20 2033  Weight: 37.6 kg 36.7 kg    Intake/Output:    Intake/Output Summary (Last 24 hours) at 04/24/2020 1355 Last data filed at 04/24/2020 0539 Gross per 24 hour  Intake 0 ml  Output 0 ml  Net 0 ml    Physical Exam: General:  No acute distress, lying in bed,receiving dialysis treatment  HEENT   Oral mucous membrane moist, Sclerae and conjunctivae clear  Pulm/lungs  Respirations symmetrical with normal effort. Lungs-Clear b/l  CVS/Heart  S1S2, Irregular,no rub or gallop  Abdomen:   Soft, nontender  Extremities:  Bilateral lower extremity edema trace  Neurologic:  Alert, oriented, able to follow commands  Skin:  No acute rashes, or lesions  Left arm AV fistula    Basic Metabolic Panel:  Recent Labs  Lab 04/19/20 1219 04/19/20 1219 04/20/20 2040 04/21/20 0345 04/21/20 0345 04/22/20 0443 04/23/20 0455 04/24/20 0416  NA 135  --   --  137  --  135 138 141  K 4.1  --   --  3.4*  --  3.3* 3.4* 3.6  CL 99  --   --  100  --  102 103 105  CO2 26  --   --  23  --  25 26 26   GLUCOSE  96  --   --  113*  --  93 81 92  BUN 27*  --   --  33*  --  33* 17 19  CREATININE 2.90*   < > 3.77* 3.46*  --  3.34* 2.33* 2.63*  CALCIUM 8.0*   < >  --  7.6*   < > 7.5* 7.9* 7.9*  MG  --   --   --  1.8  --  2.1 1.8 2.1  PHOS  --   --   --   --   --  4.0  --  2.9   < > = values in this interval not displayed.     CBC: Recent Labs  Lab 04/20/20 2040 04/21/20 0345 04/22/20 0443 04/23/20 0455 04/24/20 0416  WBC 10.0 8.2 7.1 5.4 5.7  NEUTROABS  --  6.8 5.8 4.2  --   HGB 11.1* 10.1* 9.2* 9.4* 8.8*  HCT 32.4* 28.0* 26.7* 27.6* 26.3*  MCV 102.9* 98.6 102.7* 103.8* 103.5*  PLT 176 152 122* 120* 127*      Lab Results  Component Value Date   HEPBSAG NON REACTIVE 04/22/2020   HEPBSAB Non Reactive 07/27/2018      Microbiology:  Recent Results (from the past 240 hour(s))  Urine culture     Status: Abnormal   Collection Time: 04/19/20  12:06 PM   Specimen: Urine, Random  Result Value Ref Range Status   Specimen Description   Final    URINE, RANDOM Performed at [facility], [address] [place], [place] [address]    Special Requests   Final    NONE Performed at [facility], [place], [place] [address]    Culture (A)  Final    >=100,000 COLONIES/mL ESCHERICHIA COLI >=100,000 COLONIES/mL ENTEROCOCCUS FAECALIS    Report Status 04/22/2020 FINAL  Final   Organism ID, Bacteria ESCHERICHIA COLI (A)  Final   Organism ID, Bacteria ENTEROCOCCUS FAECALIS (A)  Final      Susceptibility   Escherichia coli - MIC*    AMPICILLIN >=32 RESISTANT Resistant     CEFAZOLIN <=4 SENSITIVE Sensitive     CEFTRIAXONE <=0.25 SENSITIVE Sensitive     CIPROFLOXACIN <=0.25 SENSITIVE Sensitive     GENTAMICIN <=1 SENSITIVE Sensitive     IMIPENEM <=0.25 SENSITIVE Sensitive     NITROFURANTOIN <=16 SENSITIVE Sensitive     TRIMETH/SULFA <=20 SENSITIVE Sensitive     AMPICILLIN/SULBACTAM >=32 RESISTANT Resistant     PIP/TAZO <=4 SENSITIVE Sensitive     * >=100,000  COLONIES/mL ESCHERICHIA COLI   Enterococcus faecalis - MIC*    AMPICILLIN <=2 SENSITIVE Sensitive     NITROFURANTOIN <=16 SENSITIVE Sensitive     VANCOMYCIN 2 SENSITIVE Sensitive     * >=100,000 COLONIES/mL ENTEROCOCCUS FAECALIS  SARS Coronavirus 2 by RT PCR (hospital order, performed in [place] hospital lab) Nasopharyngeal Nasopharyngeal Swab     Status: None   Collection Time: 04/20/20  1:46 AM   Specimen: Nasopharyngeal Swab  Result Value Ref Range Status   SARS Coronavirus 2 NEGATIVE NEGATIVE Final    Comment: (NOTE) SARS-CoV-2 target nucleic acids are NOT DETECTED.  The SARS-CoV-2 RNA is generally detectable in upper and lower respiratory specimens during the acute phase of infection. The lowest concentration of SARS-CoV-2 viral copies this assay can detect is 250 copies / mL. A negative result does not preclude SARS-CoV-2 infection and should not be used as the sole basis for treatment or other patient management decisions.  A negative result may occur with improper specimen collection / handling, submission of specimen other than nasopharyngeal swab, presence of viral mutation(s) within the areas targeted by this assay, and inadequate number of viral copies (<250 copies / mL). A negative result must be combined with clinical observations, patient history, and epidemiological information.  Fact Sheet for Patients:   StrictlyIdeas.no  Fact Sheet for Healthcare Providers: BankingDealers.co.za  This test is not yet approved or  cleared by the [place] FDA and has been authorized for detection and/or diagnosis of SARS-CoV-2 by FDA under an Emergency Use Authorization (EUA).  This EUA will remain in effect (meaning this test can be used) for the duration of the COVID-19 declaration under Section 564(b)(1) of the Act, 21 U.S.C. section 360bbb-3(b)(1), unless the authorization is terminated or revoked sooner.  Performed at  [facility], [address] [place], [place] [address]     Coagulation Studies: No results for input(s): LABPROT, INR in the last 72 hours.  Urinalysis: No results for input(s): COLORURINE, LABSPEC, PHURINE, GLUCOSEU, HGBUR, BILIRUBINUR, KETONESUR, PROTEINUR, UROBILINOGEN, NITRITE, LEUKOCYTESUR in the last 72 hours.  Invalid input(s): APPERANCEUR    Imaging: DG Abd 1 View  Result Date: 04/23/2020 CLINICAL DATA:  Abdominal pain, decubitus ulcer EXAM: ABDOMEN - 1 VIEW COMPARISON:  07/26/2019 FINDINGS: Supine frontal view of the abdomen  and pelvis demonstrates mild nonspecific gaseous distension of small bowel within the central abdomen, maximal diameter measuring 2.3 cm. Gas is seen throughout the colon, with no evidence of high-grade obstruction. Stable endoluminal stent graft. No masses or abnormal calcifications. No acute bony abnormalities. IMPRESSION: 1. Nonspecific gaseous distension of the small bowel, which may reflect ileus or early/intermittent obstruction. No high-grade obstruction at this time. Electronically Signed   By: Randa Ngo M.D.   On: 04/23/2020 19:54     Medications:    . (feeding supplement) PROSource Plus  30 mL Oral TID BM  . allopurinol  100 mg Oral QHS  . vitamin C  500 mg Oral BID  . atorvastatin  40 mg Oral QHS  . Chlorhexidine Gluconate Cloth  6 each Topical Q0600  . clopidogrel  75 mg Oral QHS  . collagenase   Topical Daily  . dronabinol  2.5 mg Oral BID AC  . epoetin (EPOGEN/PROCRIT) injection  4,000 Units Intravenous Q T,Th,Sa-HD  . feeding supplement  1 Container Oral TID BM  . gabapentin  300 mg Oral QHS  . heparin  5,000 Units Subcutaneous Q8H  . HYDROmorphone  4 mg Oral Once  . insulin aspart  0-5 Units Subcutaneous QHS  . insulin aspart  0-6 Units Subcutaneous TID WC  . lipase/protease/amylase  24,000 Units Oral TID  . metoprolol tartrate  12.5 mg Oral BID  . mirtazapine  15 mg Oral QHS  . multivitamin  1 tablet Oral QHS   . multivitamin-lutein  1 capsule Oral Daily  . nicotine  21 mg Transdermal Daily  . pantoprazole  40 mg Oral Daily  . potassium chloride  20 mEq Oral Once  . senna-docusate  1 tablet Oral BID  . sertraline  50 mg Oral Daily  . traZODone  150 mg Oral QHS   acetaminophen **OR** acetaminophen, albuterol, alum & mag hydroxide-simeth, diphenhydrAMINE, HYDROmorphone, midodrine, nitroGLYCERIN, ondansetron **OR** ondansetron (ZOFRAN) IV, promethazine  Assessment/ Plan:  69 y.o. female with medical problems of ESRD, on hemodialysis, CAD/CABG 03/2011, COPD, atrial fibrillation, hypertension, COPD, chronic pancreatitis and chronic abdominal pain requiring recurrent hospitalizations , h/o AAA, cholecystectomy, severe peripheral arterial disease including bilateral renal artery stent, subclavian artery stenosis, left, left CEA  was admitted on 04/19/2020 for  Principal Problem:   Inadequate oral intake Active Problems:   Essential hypertension   Atrial fibrillation (HCC)   Chronic diastolic heart failure (HCC)   COPD with chronic bronchitis (HCC)   S/P CABG x 3   Diabetes mellitus (HCC)   Chronic kidney disease (CKD), stage IV (severe) (HCC)   Severe protein-calorie malnutrition (HCC)   Pressure injury, stage 4 (HCC)   ESRD on hemodialysis ([place])   UTI (urinary tract infection)   Infected wound   Failure to thrive in adult  Infected wound [T14.8XXA, L08.9] Weakness [R53.1] Failure to thrive in adult [R62.7] Inadequate oral intake [R63.8] Acute cystitis with hematuria [N30.01] Wound of right buttock, initial encounter [S31.819A] Pressure injury of sacral region, stage 1 [place] [facility]/TTS/left arm AV fistula/[facility] [place]  #. ESRD Dialysis today Will keep TTS schedule Regular diet to encourage intake Patient seen during dialysis Tolerating well    #. Anemia of CKD  Lab Results  Component Value Date   HGB 8.8 (L) 04/24/2020   Low dose EPO with HD  #. Secondary  hyperparathyroidism of renal origin N 25.81      Component Value Date/Time   PTH 80 (H) 08/15/2018 1435   Lab Results  Component Value Date   PHOS 2.9 04/24/2020   Monitor calcium and phos level during this admission   #.  Rt Buttock wound Eval by wound care nurse S/p I & D rt ischial decub ulcer Concern about severe protein calorie malnutrition Getting oral protein supplements  #Urinary tract infection E. coli and Enterococcus faecalis Treated with IV Unasyn- completed   LOS: 4 [place] 8/19/20211:55 PM  [facility] [place], [place]

## 2020-04-24 NOTE — Progress Notes (Signed)
PROGRESS NOTE    [name]  ZOX:[id number] DOB: 1951/06/01 DOA: 04/19/2020 PCP: Bernerd Limbo, MD   Brief Narrative:  Briefly this is a 69 year old female with extensive past medical history of ESRD on hemodialysis TTS, CAD status post CABG, status post mitral valve repair, atrial fibrillation on anticoagulation, chronic diastolic congestive heart failure, PAD, bilateral renal artery stenosis, severe protein calorie malnutrition, chronic pancreatitis, chronic narcotic use, COPD, hypertension, depression.  She presented to the emergency department yesterday with weakness, generalized pain, acute on chronic pain of the right buttock ulcer.  The patient's been followed by wound care clinic.  However the wound had worsened and pain worsened to the point that the patient presented to the emergency department.  She is also markedly malnourished weight has dropped to 37.6 kg.  BMI 13.8.  8/16: Patient seen and examined.  Seen by general surgery yesterday.  Status post wound debridement at bedside.  Procalcitonin elevated.  Remains on Unasyn.  Nephrology following.  No immediate plans for dialysis.  Patient complaining of pain this morning.  States that her home Dilaudid is ineffective consistently requiring requesting more.  Also had questions about PEG tube placement.  I explained that she would likely need a colonoscopy or endoscopic evaluation prior and that focus at this time would be on treatment of the wound and associated infection.  Once this is resolved we can consider referral to interventional radiology and gastroenterology for colonoscopy PEG tube placement.  8/17: Patient seen and examined.  Still with persistent pain in the right buttock and right ankle.  Procalcitonin decreasing.  White blood cell count decreasing.  Infectious focus slowly resolving.  Again endorsing insufficient pain control.  Again revisited PEG tube issue.  Explained that patient will likely need endoscopic evaluation  prior and at this time the focus would be on resolution of her infection.  She expressed understanding.   Assessment & Plan:   Principal Problem:   Inadequate oral intake Active Problems:   Essential hypertension   Atrial fibrillation (HCC)   Chronic diastolic heart failure (HCC)   COPD with chronic bronchitis (HCC)   S/P CABG x 3   Diabetes mellitus (HCC)   Chronic kidney disease (CKD), stage IV (severe) (HCC)   Severe protein-calorie malnutrition (HCC)   Pressure injury, stage 4 (HCC)   ESRD on hemodialysis (HCC)   UTI (urinary tract infection)   Infected wound   Failure to thrive in adult  Infected right buttock wound Right ankle wound Patient presented with acute on chronic pain These wounds been present she is followed by the wound care clinic Recently had increased pain and increased foul-smelling drainage On admission had elevated procalcitonin was given Rocephin Plan: --Continue Unasyn --increase home oral dilaudid to 6 mg PRN --wound care --will need better nutrition for wound healing  Poor p.o. intake Severe protein calorie malnutrition Adult failure to thrive Patient has had a 2-year history of unintentional weight loss Endorses poor appetite's Was being worked up outpatient for a colonoscopy and potential PEG tube evaluation --albumin 1.6 Plan: --continue marinol and Remeron  --GI consult today for G-tube placement  Hypoglycemia 2/2 poor oral intake --start D5@50  ml/hr --BG checks to monitor for hypoglycemia  ESRD on hemodialysis iHD per nephrology  Urinary tract infection Unclear whether this represents a true infection In any case patient is on Unasyn for infected wound  Chronic hypotension On midodrine  Atrial fibrillation Currently rate controlled Not on anticoagulants  Chronic diastolic congestive heart failure Currently euvolemic Cautious with  any IV fluids  COPD with chronic bronchitis Not acutely exacerbated Continue home  inhaler regimen  CAD status post CABG x3 No acute issues  Diabetes mellitus, not currently active --A1c has been normal for the past year --No need for SSI  Chronic pain Chronic pancreatitis Continue Creon --IV dilaudid d/c'ed --increase home oral dilaudid PRN    DVT prophylaxis: Heparin SQ Code Status: Full code  Family Communication: daughter updated at bedside today Status is: inpatient Dispo:   The patient is from: home Anticipated d/c is to: SNF Anticipated d/c date is: 2-3 days Patient currently is not medically stable to d/c due to: Need G-tube placement for reliable nutrition and wound healing.    Consultants:   Nephrology  General surgery  Procedures:   Bedside wound debridement, 04/20/20  Antimicrobials:   Unasyn, 04/20/2020-   Subjective: Pt reported generalized pain, requested IV pain meds.  Not eating enough, and having multiple hypoglycemic episodes.  Having BM's.  GI consult for G-tube placement.   Objective: Vitals:   04/24/20 1315 04/24/20 1330 04/24/20 1345 04/24/20 1442  BP: (!) 100/45 (!) 115/49 (!) 100/45 (!) 128/54  Pulse: 65 66 65 72  Resp: 14 15 15 20   Temp:   98.2 F (36.8 C) 99.2 F (37.3 C)  TempSrc:   Oral Oral  SpO2:   97% 97%  Weight:      Height:        Intake/Output Summary (Last 24 hours) at 04/24/2020 1843 Last data filed at 04/24/2020 1345 Gross per 24 hour  Intake 0 ml  Output 500 ml  Net -500 ml   Filed Weights   04/19/20 1218 04/20/20 2033  Weight: 37.6 kg 36.7 kg    Examination:  Constitutional: NAD, AAOx3, thin HEENT: conjunctivae and lids normal, EOMI CV: RRR no M,R,G. Distal pulses +2.  No cyanosis.   RESP: CTA B/L, normal respiratory effort  GI: +BS, NTND, soft Extremities: No effusions, edema, or tenderness in BLE Neuro: II - XII grossly intact.   Psych: depressed mood and affect.    Data Reviewed: I have personally reviewed following labs and imaging studies  CBC: Recent Labs  Lab  04/20/20 2040 04/21/20 0345 04/22/20 0443 04/23/20 0455 04/24/20 0416  WBC 10.0 8.2 7.1 5.4 5.7  NEUTROABS  --  6.8 5.8 4.2  --   HGB 11.1* 10.1* 9.2* 9.4* 8.8*  HCT 32.4* 28.0* 26.7* 27.6* 26.3*  MCV 102.9* 98.6 102.7* 103.8* 103.5*  PLT 176 152 122* 120* 242*   Basic Metabolic Panel: Recent Labs  Lab 04/19/20 1219 04/19/20 1219 04/20/20 2040 04/21/20 0345 04/22/20 0443 04/23/20 0455 04/24/20 0416  NA 135  --   --  137 135 138 141  K 4.1  --   --  3.4* 3.3* 3.4* 3.6  CL 99  --   --  100 102 103 105  CO2 26  --   --  23 25 26 26   GLUCOSE 96  --   --  113* 93 81 92  BUN 27*  --   --  33* 33* 17 19  CREATININE 2.90*   < > 3.77* 3.46* 3.34* 2.33* 2.63*  CALCIUM 8.0*  --   --  7.6* 7.5* 7.9* 7.9*  MG  --   --   --  1.8 2.1 1.8 2.1  PHOS  --   --   --   --  4.0  --  2.9   < > = values in this interval not displayed.  GFR: Estimated Creatinine Clearance: 12 mL/min (A) (by C-G formula based on SCr of 2.63 mg/dL (H)). Liver Function Tests: Recent Labs  Lab 04/24/20 0416  ALBUMIN 1.6*   No results for input(s): LIPASE, AMYLASE in the last 168 hours. No results for input(s): AMMONIA in the last 168 hours. Coagulation Profile: No results for input(s): INR, PROTIME in the last 168 hours. Cardiac Enzymes: No results for input(s): CKTOTAL, CKMB, CKMBINDEX, TROPONINI in the last 168 hours. BNP (last 3 results) No results for input(s): PROBNP in the last 8760 hours. HbA1C: No results for input(s): HGBA1C in the last 72 hours. CBG: Recent Labs  Lab 04/23/20 2118 04/24/20 0750 04/24/20 0828 04/24/20 1442 04/24/20 1643  GLUCAP 141* 73 71 60* 114*   Lipid Profile: No results for input(s): CHOL, HDL, LDLCALC, TRIG, CHOLHDL, LDLDIRECT in the last 72 hours. Thyroid Function Tests: No results for input(s): TSH, T4TOTAL, FREET4, T3FREE, THYROIDAB in the last 72 hours. Anemia Panel: No results for input(s): VITAMINB12, FOLATE, FERRITIN, TIBC, IRON, RETICCTPCT in the last 72  hours. Sepsis Labs: Recent Labs  Lab 04/20/20 2040 04/21/20 0345 04/22/20 0443  PROCALCITON 5.32 4.66 2.62    Recent Results (from the past 240 hour(s))  Urine culture     Status: Abnormal   Collection Time: 04/19/20 12:06 PM   Specimen: Urine, Random  Result Value Ref Range Status   Specimen Description   Final    URINE, RANDOM Performed at [facility], [address] [place], [place] [address]    Special Requests   Final    NONE Performed at [facility], [address] [place], [place] [address]    Culture (A)  Final    >=100,000 COLONIES/mL ESCHERICHIA COLI >=100,000 COLONIES/mL ENTEROCOCCUS FAECALIS    Report Status 04/22/2020 FINAL  Final   Organism ID, Bacteria ESCHERICHIA COLI (A)  Final   Organism ID, Bacteria ENTEROCOCCUS FAECALIS (A)  Final      Susceptibility   Escherichia coli - MIC*    AMPICILLIN >=32 RESISTANT Resistant     CEFAZOLIN <=4 SENSITIVE Sensitive     CEFTRIAXONE <=0.25 SENSITIVE Sensitive     CIPROFLOXACIN <=0.25 SENSITIVE Sensitive     GENTAMICIN <=1 SENSITIVE Sensitive     IMIPENEM <=0.25 SENSITIVE Sensitive     NITROFURANTOIN <=16 SENSITIVE Sensitive     TRIMETH/SULFA <=20 SENSITIVE Sensitive     AMPICILLIN/SULBACTAM >=32 RESISTANT Resistant     PIP/TAZO <=4 SENSITIVE Sensitive     * >=100,000 COLONIES/mL ESCHERICHIA COLI   Enterococcus faecalis - MIC*    AMPICILLIN <=2 SENSITIVE Sensitive     NITROFURANTOIN <=16 SENSITIVE Sensitive     VANCOMYCIN 2 SENSITIVE Sensitive     * >=100,000 COLONIES/mL ENTEROCOCCUS FAECALIS  SARS Coronavirus 2 by RT PCR (hospital order, performed in [facility] hospital lab) Nasopharyngeal Nasopharyngeal Swab     Status: None   Collection Time: 04/20/20  1:46 AM   Specimen: Nasopharyngeal Swab  Result Value Ref Range Status   SARS Coronavirus 2 NEGATIVE NEGATIVE Final    Comment: (NOTE) SARS-CoV-2 target nucleic acids are NOT DETECTED.  The SARS-CoV-2 RNA is generally detectable in  upper and lower respiratory specimens during the acute phase of infection. The lowest concentration of SARS-CoV-2 viral copies this assay can detect is 250 copies / mL. A negative result does not preclude SARS-CoV-2 infection and should not be used as the sole basis for treatment or other patient management decisions.  A negative result may occur with improper specimen  collection / handling, submission of specimen other than nasopharyngeal swab, presence of viral mutation(s) within the areas targeted by this assay, and inadequate number of viral copies (<250 copies / mL). A negative result must be combined with clinical observations, patient history, and epidemiological information.  Fact Sheet for Patients:   StrictlyIdeas.no  Fact Sheet for Healthcare Providers: BankingDealers.co.za  This test is not yet approved or  cleared by the [place] FDA and has been authorized for detection and/or diagnosis of SARS-CoV-2 by FDA under an Emergency Use Authorization (EUA).  This EUA will remain in effect (meaning this test can be used) for the duration of the COVID-19 declaration under Section 564(b)(1) of the Act, 21 U.S.C. section 360bbb-3(b)(1), unless the authorization is terminated or revoked sooner.  Performed at [facility], [address] [place], [place] [address]          Radiology Studies: DG Abd 1 View  Result Date: 04/23/2020 CLINICAL DATA:  Abdominal pain, decubitus ulcer EXAM: ABDOMEN - 1 VIEW COMPARISON:  07/26/2019 FINDINGS: Supine frontal view of the abdomen and pelvis demonstrates mild nonspecific gaseous distension of small bowel within the central abdomen, maximal diameter measuring 2.3 cm. Gas is seen throughout the colon, with no evidence of high-grade obstruction. Stable endoluminal stent graft. No masses or abnormal calcifications. No acute bony abnormalities. IMPRESSION: 1. Nonspecific gaseous  distension of the small bowel, which may reflect ileus or early/intermittent obstruction. No high-grade obstruction at this time. Electronically Signed   By: Randa Ngo M.D.   On: 04/23/2020 19:54        Scheduled Meds:  (feeding supplement) PROSource Plus  30 mL Oral TID BM   allopurinol  100 mg Oral QHS   vitamin C  500 mg Oral BID   atorvastatin  40 mg Oral QHS   Chlorhexidine Gluconate Cloth  6 each Topical Q0600   clopidogrel  75 mg Oral QHS   collagenase   Topical Daily   dronabinol  2.5 mg Oral BID AC   epoetin (EPOGEN/PROCRIT) injection  4,000 Units Intravenous Q T,Th,Sa-HD   feeding supplement  1 Container Oral TID BM   gabapentin  300 mg Oral QHS   heparin  5,000 Units Subcutaneous Q8H   HYDROmorphone  4 mg Oral Once   insulin aspart  0-5 Units Subcutaneous QHS   insulin aspart  0-6 Units Subcutaneous TID WC   lipase/protease/amylase  24,000 Units Oral TID   metoprolol tartrate  12.5 mg Oral BID   mirtazapine  15 mg Oral QHS   multivitamin  1 tablet Oral QHS   multivitamin-lutein  1 capsule Oral Daily   nicotine  21 mg Transdermal Daily   pantoprazole  40 mg Oral Daily   potassium chloride  20 mEq Oral Once   senna-docusate  1 tablet Oral BID   sertraline  50 mg Oral Daily   traZODone  150 mg Oral QHS   Continuous Infusions:  dextrose 5 % and 0.45 % NaCl with KCl 40 mEq/L 50 mL/hr at 04/24/20 1633     LOS: 4 days     Enzo Bi, MD [facility] Pager [phone]  If 7PM-7AM, please contact night-coverage 04/24/2020, 6:43 PM

## 2020-04-25 DIAGNOSIS — L89151 Pressure ulcer of sacral region, stage 1: Secondary | ICD-10-CM

## 2020-04-25 LAB — BASIC METABOLIC PANEL
Anion gap: 6 (ref 5–15)
BUN: 9 mg/dL (ref 8–23)
CO2: 27 mmol/L (ref 22–32)
Calcium: 7.8 mg/dL — ABNORMAL LOW (ref 8.9–10.3)
Chloride: 104 mmol/L (ref 98–111)
Creatinine, Ser: 1.77 mg/dL — ABNORMAL HIGH (ref 0.44–1.00)
GFR calc Af Amer: 34 mL/min — ABNORMAL LOW (ref >60)
GFR calc non Af Amer: 29 mL/min — ABNORMAL LOW (ref >60)
Glucose, Bld: 108 mg/dL — ABNORMAL HIGH (ref 70–99)
Potassium: 4.1 mmol/L (ref 3.5–5.1)
Sodium: 137 mmol/L (ref 135–145)

## 2020-04-25 LAB — CBC
HCT: 29.4 % — ABNORMAL LOW (ref 36.0–46.0)
Hemoglobin: 10.2 g/dL — ABNORMAL LOW (ref 12.0–15.0)
MCH: 35.3 pg — ABNORMAL HIGH (ref 26.0–34.0)
MCHC: 34.7 g/dL (ref 30.0–36.0)
MCV: 101.7 fL — ABNORMAL HIGH (ref 80.0–100.0)
Platelets: 132 10*3/uL — ABNORMAL LOW (ref 150–400)
RBC: 2.89 MIL/uL — ABNORMAL LOW (ref 3.87–5.11)
RDW: 15.4 % (ref 11.5–15.5)
WBC: 6.9 10*3/uL (ref 4.0–10.5)
nRBC: 0 % (ref 0.0–0.2)

## 2020-04-25 LAB — GLUCOSE, CAPILLARY
Glucose-Capillary: 104 mg/dL — ABNORMAL HIGH (ref 70–99)
Glucose-Capillary: 134 mg/dL — ABNORMAL HIGH (ref 70–99)
Glucose-Capillary: 78 mg/dL (ref 70–99)
Glucose-Capillary: 176 mg/dL — ABNORMAL HIGH (ref 70–99)

## 2020-04-25 LAB — MAGNESIUM: Magnesium: 1.8 mg/dL (ref 1.7–2.4)

## 2020-04-25 LAB — PHOSPHORUS: Phosphorus: 1.4 mg/dL — ABNORMAL LOW (ref 2.5–4.6)

## 2020-04-25 MED ORDER — MAGNESIUM SULFATE 2 GM/50ML IV SOLN
2.0000 g | Freq: Once | INTRAVENOUS | Status: AC
  Administered 2020-04-25: 2 g via INTRAVENOUS
  Filled 2020-04-25: qty 50

## 2020-04-25 MED ORDER — SODIUM PHOSPHATES 45 MMOLE/15ML IV SOLN
20.0000 mmol | Freq: Once | INTRAVENOUS | Status: AC
  Administered 2020-04-25: 20 mmol via INTRAVENOUS
  Filled 2020-04-25: qty 6.67

## 2020-04-25 NOTE — Progress Notes (Signed)
   04/25/20 1815  Clinical Encounter Type  Visited With Patient and family together  Visit Type Follow-up;Spiritual support;Social support  Referral From Chaplain  Consult/Referral To Chaplain  Follow-up with Pt for prayer. I went to room earlier Pt was eating and family member told me to come back later. Return to room for follow-up. I prayed for the Pt. Ch will follow-up.

## 2020-04-25 NOTE — Consult Note (Addendum)
PHARMACY CONSULT NOTE  Pharmacy Consult for Electrolyte Monitoring and Replacement   Recent Labs: Potassium (mmol/L)  Date Value  04/25/2020 4.1   Magnesium (mg/dL)  Date Value  04/25/2020 1.8   Calcium (mg/dL)  Date Value  04/25/2020 7.8 (L)   Albumin (g/dL)  Date Value  04/24/2020 1.6 (L)   Phosphorus (mg/dL)  Date Value  04/25/2020 1.4 (L)   Sodium  Date Value  04/25/2020 137 mmol/L  07/03/2018 143   Corrected Ca:  9.7 mg/dL  Assessment: 69 year old female presented with weakness, buttock ulcer, and inadequate oral intake. Pt has a PMH of ESRD on HD TTS, CAD s/p CABG, a.fib, mitral valve repair, dCHF, PAD, bilateral renal artery stenosis, severe protein calorie malnutrition, chronic pancreatitis, COPD, HTN, and depression. Pt has a 2-year history of unintentional weight loss + poor appetite. GI has been consulted for placement of G-tube.   Goal of Therapy (given cardiac history):  Mg 2.0 - 2.4 mg/dL K 4.0 - 5.1 mmol/L All Other Electrolytes WNL  Plan:  -Give sodium phosphate 20 mmol x 1 dose -Give Mg 2 g IV x 1 dose -f/u closely for likelihood of refeeding syndrome -Monitor electrolytes with AM labs   Benn Moulder, PharmD Pharmacy Resident  04/25/2020 11:34 AM

## 2020-04-25 NOTE — Progress Notes (Signed)
Physical Therapy Treatment Patient Details Name: [name] MRN: [id number] DOB: Nov 30, 1950 Today's Date: 04/25/2020    History of Present Illness Pt presented to ER secondary to decreased PO intake, progressive weakness and inability to get OOB/mobilize in home environment; admitted for management of inadequate oral intake, UTI and sacral decub (s/p bedside debridement)    PT Comments    Pt resting in bed upon PT arrival with family present.  Min to mod assist with bed mobility; min assist with transfers; and min assist to ambulate 20 feet with RW.  Pt able to stand up to RW and take x5 steps B LE's in place initially but LE's noted to be shaky so pt sat back down.  Pt's LE's appearing less shaky 2nd attempt standing and then pt able to ambulate as noted above.  8/10 buttock and R lateral heal pain (pt received pain medication prior to PT session); nurse present end of session and aware.  Will continue to focus on strengthening and progressive functional mobility during hospitalization.    Follow Up Recommendations  SNF     Equipment Recommendations  Rolling walker with 5" wheels;3in1 (PT)    Recommendations for Other Services OT consult     Precautions / Restrictions Precautions Precautions: Fall Precaution Comments: sacral decub, R lateral heel wound Restrictions Weight Bearing Restrictions: No    Mobility  Bed Mobility Overal bed mobility: Needs Assistance Bed Mobility: Supine to Sit;Sit to Supine     Supine to sit: Min assist;Mod assist;HOB elevated (assist for trunk semi-supine to sitting edge of bed) Sit to supine: Min assist;HOB elevated (min assist for B LE's)   General bed mobility comments: increased time to perform d/t pain in buttocks/sacrum  Transfers Overall transfer level: Needs assistance Equipment used: Rolling walker (2 wheeled) Transfers: Sit to/from Stand Sit to Stand: Min assist         General transfer comment: minimal assist to stand up to  RW x2 trials from bed and to control descent sitting back onto bed  Ambulation/Gait Ambulation/Gait assistance: Min assist Gait Distance (Feet): 20 Feet Assistive device: Rolling walker (2 wheeled)   Gait velocity: decreased   General Gait Details: decreased B LE step length/foot clearance/heelstrike; forward flexed posture; assist for balance with walking   Stairs             Wheelchair Mobility    Modified Rankin (Stroke Patients Only)       Balance Overall balance assessment: Needs assistance Sitting-balance support: No upper extremity supported;Feet supported Sitting balance-Leahy Scale: Good Sitting balance - Comments: steady sitting reaching within BOS   Standing balance support: Single extremity supported Standing balance-Leahy Scale: Poor Standing balance comment: pt requiring at least single UE support for static standing balance                            Cognition Arousal/Alertness: Awake/alert Behavior During Therapy: WFL for tasks assessed/performed Overall Cognitive Status: Within Functional Limits for tasks assessed                                        Exercises      General Comments General comments (skin integrity, edema, etc.): a little blood noted on bed upon PT taking pt's blanket off and pt noted to be bleeding from R lateral lower leg (just distal to R knee near fibular  head): nursing notified and came to address.  Nursing cleared pt for participation in physical therapy.  Pt agreeable to PT session (pt was getting ready to eat [employer] food upon PT arrival but then pt requesting to participate in therapy first prior to eating).      Pertinent Vitals/Pain Pain Assessment: 0-10 Pain Score: 8  Pain Location: buttocks, R lateral malleolus Pain Descriptors / Indicators: Aching;Grimacing Pain Intervention(s): Limited activity within patient's tolerance;Premedicated before session;Repositioned  Vitals (HR and O2 on  room air) stable and WFL throughout treatment session.    Home Living                      Prior Function            PT Goals (current goals can now be found in the care plan section) Acute Rehab PT Goals Patient Stated Goal: to get my strength back PT Goal Formulation: With patient Time For Goal Achievement: 05/07/20 Potential to Achieve Goals: Fair Progress towards PT goals: Progressing toward goals    Frequency    Min 2X/week      PT Plan Current plan remains appropriate    Co-evaluation              AM-PAC PT "6 Clicks" Mobility   Outcome Measure  Help needed turning from your back to your side while in a flat bed without using bedrails?: A Little Help needed moving from lying on your back to sitting on the side of a flat bed without using bedrails?: A Lot Help needed moving to and from a bed to a chair (including a wheelchair)?: A Little Help needed standing up from a chair using your arms (e.g., wheelchair or bedside chair)?: A Little Help needed to walk in hospital room?: A Little Help needed climbing 3-5 steps with a railing? : A Lot 6 Click Score: 16    End of Session Equipment Utilized During Treatment: Gait belt Activity Tolerance: Patient tolerated treatment well Patient left: in bed;with call bell/phone within reach;with bed alarm set;with nursing/sitter in room;with family/visitor present;Other (comment) (B heels floating via pillow support (pt refusing prevalon boots d/t being uncomfortable)) Nurse Communication: Mobility status;Precautions;Other (comment) (pt's pain status) PT Visit Diagnosis: Muscle weakness (generalized) (M62.81);Difficulty in walking, not elsewhere classified (R26.2)     Time: 9381-8299 PT Time Calculation (min) (ACUTE ONLY): 23 min  Charges:  $Therapeutic Activity: 23-37 mins                    Leitha Bleak, PT 04/25/20, 5:02 PM

## 2020-04-25 NOTE — Progress Notes (Signed)
Mobility Specialist - Progress Note   04/25/20 1300  Mobility  Activity Refused mobility  Mobility performed by Mobility specialist     Pt refused mobility session this afternoon d/t feeling tired and wanting to rest. Will attempt again at a later date/time.   [name] Mobility Specialist  04/25/20, 2:00 PM

## 2020-04-25 NOTE — TOC Progression Note (Signed)
Transition of Care [facility]) - Progression Note    Patient Details  Name: MIAISABELLA BACORN MRN: [id number] Date of Birth: October 17, 1950  Transition of Care [facility]) CM/SW [place], LCSW Phone Number: 04/25/2020, 9:26 AM  Clinical Narrative:  Patient's only bed offer is [facility]. CSW notified patient and daughter Almyra Free at bedside. They would like to accept. Patient has not been vaccinated so CSW made them aware of 14-day quarantine. Left message for admissions coordinator asking him to call daughter [name] to provide more information on the facility.   Expected Discharge Plan: [place] Barriers to Discharge: Continued Medical Work up  Expected Discharge Plan and Services Expected Discharge Plan: [place] Acute Care Choice: Resumption of Svcs/PTA Provider Living arrangements for the past 2 months: Single Family Home                                       Social Determinants of Health (SDOH) Interventions    Readmission Risk Interventions Readmission Risk Prevention Plan 04/23/2020 04/22/2020 01/24/2020  Transportation Screening - Complete Complete  Medication Review Press photographer) - Complete Complete  PCP or Specialist appointment within 3-5 days of discharge - Complete Complete  HRI or Home Care Consult - Complete Complete  SW Recovery Care/Counseling Consult - Complete Complete  Palliative Care Screening Complete Not Applicable Complete  Skilled Nursing Facility Complete Not Applicable Not Applicable  Some recent data might be hidden

## 2020-04-25 NOTE — Care Management Important Message (Signed)
Important Message  Patient Details  Name: [name] MRN: [id number] Date of Birth: [birth year]   Medicare Important Message Given:  Yes     Dannette Barbara 04/25/2020, 12:36 PM

## 2020-04-25 NOTE — Progress Notes (Signed)
PROGRESS NOTE    [name]  EVO:[id number] DOB: [birth year] DOA: 04/19/2020 PCP: Bernerd Limbo, MD   Brief Narrative:  Briefly this is a 69 year old female with extensive past medical history of ESRD on hemodialysis TTS, CAD status post CABG, status post mitral valve repair, atrial fibrillation on anticoagulation, chronic diastolic congestive heart failure, PAD, bilateral renal artery stenosis, severe protein calorie malnutrition, chronic pancreatitis, chronic narcotic use, COPD, hypertension, depression.  She presented to the emergency department yesterday with weakness, generalized pain, acute on chronic pain of the right buttock ulcer.  The patient's been followed by wound care clinic.  However the wound had worsened and pain worsened to the point that the patient presented to the emergency department.  She is also markedly malnourished weight has dropped to 37.6 kg.  BMI 13.8.  8/16: Patient seen and examined.  Seen by general surgery yesterday.  Status post wound debridement at bedside.  Procalcitonin elevated.  Remains on Unasyn.  Nephrology following.  No immediate plans for dialysis.  Patient complaining of pain this morning.  States that her home Dilaudid is ineffective consistently requiring requesting more.  Also had questions about PEG tube placement.  I explained that she would likely need a colonoscopy or endoscopic evaluation prior and that focus at this time would be on treatment of the wound and associated infection.  Once this is resolved we can consider referral to interventional radiology and gastroenterology for colonoscopy PEG tube placement.  8/17: Patient seen and examined.  Still with persistent pain in the right buttock and right ankle.  Procalcitonin decreasing.  White blood cell count decreasing.  Infectious focus slowly resolving.  Again endorsing insufficient pain control.  Again revisited PEG tube issue.  Explained that patient will likely need endoscopic evaluation  prior and at this time the focus would be on resolution of her infection.  She expressed understanding.   Assessment & Plan:   Principal Problem:   Inadequate oral intake Active Problems:   Essential hypertension   Atrial fibrillation (HCC)   Chronic diastolic heart failure (HCC)   COPD with chronic bronchitis (HCC)   S/P CABG x 3   Diabetes mellitus (HCC)   Chronic kidney disease (CKD), stage IV (severe) (HCC)   Weakness   Severe protein-calorie malnutrition (HCC)   Pressure injury, stage 4 (HCC)   ESRD on hemodialysis (HCC)   UTI (urinary tract infection)   Infected wound   Failure to thrive in adult   Pressure injury of sacral region, stage 1  Infected right buttock wound Right ankle wound Patient presented with acute on chronic pain These wounds been present she is followed by the wound care clinic Recently had increased pain and increased foul-smelling drainage On admission had elevated procalcitonin was given Rocephin --completed 5 days of abx with Unasyn Plan: --continue home oral dilaudid at increased 6 mg q6h PRN due to increased pain --wound care --will need better nutrition for wound healing  Poor p.o. intake Severe protein calorie malnutrition Adult failure to thrive Patient has had a 2-year history of unintentional weight loss Endorses poor appetite's Was being worked up outpatient for a colonoscopy and potential PEG tube evaluation --albumin 1.6 Plan: --continue marinol and Remeron  --GI consult today, will plan on G-tube placement after plavix washout for 5 days  Hypoglycemia 2/2 poor oral intake --ACHS BG checks to monitor for hypoglycemia  ESRD on hemodialysis iHD per nephrology  Urinary tract infection Unclear whether this represents a true infection  Chronic hypotension On midodrine  Atrial fibrillation Currently rate controlled Not on anticoagulants  Chronic diastolic congestive heart failure Currently euvolemic Cautious with any IV  fluids  COPD with chronic bronchitis Not acutely exacerbated Continue home inhaler regimen  CAD status post CABG x3 No acute issues  Diabetes mellitus, not currently active --A1c has been normal for the past year --No need for SSI  Chronic pain Chronic pancreatitis --continue Creon with meals --IV dilaudid d/c'ed --increase home oral dilaudid PRN  Hypophos --replete PRN    DVT prophylaxis: Heparin SQ Code Status: Full code  Family Communication: daughter updated at bedside today Status is: inpatient Dispo:   The patient is from: home Anticipated d/c is to: SNF Anticipated d/c date is: 5 days Patient currently is not medically stable to d/c due to: Need G-tube placement for reliable nutrition and wound healing, waiting on Plavix washout.    Consultants:   Nephrology  General surgery  Procedures:   Bedside wound debridement, 04/20/20  Antimicrobials:   Unasyn, 04/20/2020-   Subjective: Still eating very little, a few bites.  GI consulted for G-tube placement.     Objective: Vitals:   04/25/20 0621 04/25/20 0923 04/25/20 0925 04/25/20 1141  BP: (!) 129/59 127/60 127/60 (!) 98/54  Pulse: 71 72 73 62  Resp: 12  18 14   Temp: 98.3 F (36.8 C)  98.2 F (36.8 C) 98.8 F (37.1 C)  TempSrc: Oral  Oral Oral  SpO2: 99%  100% 99%  Weight:      Height:        Intake/Output Summary (Last 24 hours) at 04/25/2020 1817 Last data filed at 04/25/2020 1800 Gross per 24 hour  Intake 1521.22 ml  Output --  Net 1521.22 ml   Filed Weights   04/19/20 1218 04/20/20 2033  Weight: 37.6 kg 36.7 kg    Examination:  Constitutional: NAD, sleepy HEENT: conjunctivae and lids normal, EOMI CV: RRR no M,R,G. Distal pulses +2.  No cyanosis.   RESP: CTA B/L, normal respiratory effort  GI: +BS, NTND Extremities: No effusions, edema, or tenderness in BLE SKIN: warm, dry and intact    Data Reviewed: I have personally reviewed following labs and imaging  studies  CBC: Recent Labs  Lab 04/21/20 0345 04/22/20 0443 04/23/20 0455 04/24/20 0416 04/25/20 0502  WBC 8.2 7.1 5.4 5.7 6.9  NEUTROABS 6.8 5.8 4.2  --   --   HGB 10.1* 9.2* 9.4* 8.8* 10.2*  HCT 28.0* 26.7* 27.6* 26.3* 29.4*  MCV 98.6 102.7* 103.8* 103.5* 101.7*  PLT 152 122* 120* 127* 235*   Basic Metabolic Panel: Recent Labs  Lab 04/21/20 0345 04/22/20 0443 04/23/20 0455 04/24/20 0416 04/25/20 0502  NA 137 135 138 141 137  K 3.4* 3.3* 3.4* 3.6 4.1  CL 100 102 103 105 104  CO2 23 25 26 26 27   GLUCOSE 113* 93 81 92 108*  BUN 33* 33* 17 19 9   CREATININE 3.46* 3.34* 2.33* 2.63* 1.77*  CALCIUM 7.6* 7.5* 7.9* 7.9* 7.8*  MG 1.8 2.1 1.8 2.1 1.8  PHOS  --  4.0  --  2.9 1.4*   GFR: Estimated Creatinine Clearance: 17.9 mL/min (A) (by C-G formula based on SCr of 1.77 mg/dL (H)). Liver Function Tests: Recent Labs  Lab 04/24/20 0416  ALBUMIN 1.6*   No results for input(s): LIPASE, AMYLASE in the last 168 hours. No results for input(s): AMMONIA in the last 168 hours. Coagulation Profile: No results for input(s): INR, PROTIME in the last 168 hours. Cardiac Enzymes: No results for  input(s): CKTOTAL, CKMB, CKMBINDEX, TROPONINI in the last 168 hours. BNP (last 3 results) No results for input(s): PROBNP in the last 8760 hours. HbA1C: No results for input(s): HGBA1C in the last 72 hours. CBG: Recent Labs  Lab 04/24/20 1643 04/24/20 2048 04/25/20 0749 04/25/20 1141 04/25/20 1656  GLUCAP 114* 137* 104* 134* 78   Lipid Profile: No results for input(s): CHOL, HDL, LDLCALC, TRIG, CHOLHDL, LDLDIRECT in the last 72 hours. Thyroid Function Tests: No results for input(s): TSH, T4TOTAL, FREET4, T3FREE, THYROIDAB in the last 72 hours. Anemia Panel: No results for input(s): VITAMINB12, FOLATE, FERRITIN, TIBC, IRON, RETICCTPCT in the last 72 hours. Sepsis Labs: Recent Labs  Lab 04/20/20 2040 04/21/20 0345 04/22/20 0443  PROCALCITON 5.32 4.66 2.62    Recent Results  (from the past 240 hour(s))  Urine culture     Status: Abnormal   Collection Time: 04/19/20 12:06 PM   Specimen: Urine, Random  Result Value Ref Range Status   Specimen Description   Final    URINE, RANDOM Performed at [facility], [address] [place], [place] [address]    Special Requests   Final    NONE Performed at [facility], [address] [place], [place] [address]    Culture (A)  Final    >=100,000 COLONIES/mL ESCHERICHIA COLI >=100,000 COLONIES/mL ENTEROCOCCUS FAECALIS    Report Status 04/22/2020 FINAL  Final   Organism ID, Bacteria ESCHERICHIA COLI (A)  Final   Organism ID, Bacteria ENTEROCOCCUS FAECALIS (A)  Final      Susceptibility   Escherichia coli - MIC*    AMPICILLIN >=32 RESISTANT Resistant     CEFAZOLIN <=4 SENSITIVE Sensitive     CEFTRIAXONE <=0.25 SENSITIVE Sensitive     CIPROFLOXACIN <=0.25 SENSITIVE Sensitive     GENTAMICIN <=1 SENSITIVE Sensitive     IMIPENEM <=0.25 SENSITIVE Sensitive     NITROFURANTOIN <=16 SENSITIVE Sensitive     TRIMETH/SULFA <=20 SENSITIVE Sensitive     AMPICILLIN/SULBACTAM >=32 RESISTANT Resistant     PIP/TAZO <=4 SENSITIVE Sensitive     * >=100,000 COLONIES/mL ESCHERICHIA COLI   Enterococcus faecalis - MIC*    AMPICILLIN <=2 SENSITIVE Sensitive     NITROFURANTOIN <=16 SENSITIVE Sensitive     VANCOMYCIN 2 SENSITIVE Sensitive     * >=100,000 COLONIES/mL ENTEROCOCCUS FAECALIS  SARS Coronavirus 2 by RT PCR (hospital order, performed in [facility] hospital lab) Nasopharyngeal Nasopharyngeal Swab     Status: None   Collection Time: 04/20/20  1:46 AM   Specimen: Nasopharyngeal Swab  Result Value Ref Range Status   SARS Coronavirus 2 NEGATIVE NEGATIVE Final    Comment: (NOTE) SARS-CoV-2 target nucleic acids are NOT DETECTED.  The SARS-CoV-2 RNA is generally detectable in upper and lower respiratory specimens during the acute phase of infection. The lowest concentration of SARS-CoV-2 viral copies this  assay can detect is 250 copies / mL. A negative result does not preclude SARS-CoV-2 infection and should not be used as the sole basis for treatment or other patient management decisions.  A negative result may occur with improper specimen collection / handling, submission of specimen other than nasopharyngeal swab, presence of viral mutation(s) within the areas targeted by this assay, and inadequate number of viral copies (<250 copies / mL). A negative result must be combined with clinical observations, patient history, and epidemiological information.  Fact Sheet for Patients:   StrictlyIdeas.no  Fact Sheet for Healthcare Providers: BankingDealers.co.za  This test is not yet approved or  cleared  by the [place] and has been authorized for detection and/or diagnosis of SARS-CoV-2 by FDA under an Emergency Use Authorization (EUA).  This EUA will remain in effect (meaning this test can be used) for the duration of the COVID-19 declaration under Section 564(b)(1) of the Act, 21 U.S.C. section 360bbb-3(b)(1), unless the authorization is terminated or revoked sooner.  Performed at [facility], [address] [place], [place] [address]          Radiology Studies: DG Abd 1 View  Result Date: 04/23/2020 CLINICAL DATA:  Abdominal pain, decubitus ulcer EXAM: ABDOMEN - 1 VIEW COMPARISON:  07/26/2019 FINDINGS: Supine frontal view of the abdomen and pelvis demonstrates mild nonspecific gaseous distension of small bowel within the central abdomen, maximal diameter measuring 2.3 cm. Gas is seen throughout the colon, with no evidence of high-grade obstruction. Stable endoluminal stent graft. No masses or abnormal calcifications. No acute bony abnormalities. IMPRESSION: 1. Nonspecific gaseous distension of the small bowel, which may reflect ileus or early/intermittent obstruction. No high-grade obstruction at this time.  Electronically Signed   By: Randa Ngo M.D.   On: 04/23/2020 19:54        Scheduled Meds:  (feeding supplement) PROSource Plus  30 mL Oral TID BM   allopurinol  100 mg Oral QHS   vitamin C  500 mg Oral BID   atorvastatin  40 mg Oral QHS   Chlorhexidine Gluconate Cloth  6 each Topical Q0600   collagenase   Topical Daily   dronabinol  2.5 mg Oral BID AC   epoetin (EPOGEN/PROCRIT) injection  4,000 Units Intravenous Q T,Th,Sa-HD   feeding supplement  1 Container Oral TID BM   gabapentin  300 mg Oral QHS   heparin  5,000 Units Subcutaneous Q8H   HYDROmorphone  4 mg Oral Once   lipase/protease/amylase  24,000 Units Oral TID   metoprolol tartrate  12.5 mg Oral BID   mirtazapine  15 mg Oral QHS   multivitamin  1 tablet Oral QHS   multivitamin-lutein  1 capsule Oral Daily   nicotine  21 mg Transdermal Daily   pantoprazole  40 mg Oral Daily   potassium chloride  20 mEq Oral Once   senna-docusate  1 tablet Oral BID   sertraline  50 mg Oral Daily   traZODone  150 mg Oral QHS   Continuous Infusions:  sodium phosphate  Dextrose 5% IVPB 42 mL/hr at 04/25/20 1800     LOS: 5 days     Enzo Bi, MD [facility] Pager [phone]  If 7PM-7AM, please contact night-coverage 04/25/2020, 6:17 PM

## 2020-04-25 NOTE — Plan of Care (Signed)
Patient dressing changed to buttock, Right ankle, and new dressing applied to right knee..  Patient tolerated well.

## 2020-04-25 NOTE — Consult Note (Signed)
Jonathon Bellows , MD [address], [place], [place], [place], [address] [address], [place], [place], [place], [address] Phone: [phone]  Fax: [phone]  Consultation  Referring Provider:     Dr Billie Ruddy Primary Care Physician:  Bernerd Limbo, MD Primary Gastroenterologist:  Dr. Rush Landmark          Reason for Consultation:     G tube placement   Date of Admission:  04/19/2020 Date of Consultation:  04/25/2020         HPI:   [name] is a 69 y.o. female who was admitted on 04/20/2020 for poor oral intake.  She has a history of coronary artery bypass graft, status post mitral valve repair, end-stage renal disease, atrial fibrillation, CHF, PAD, AAA, COPD on 2 L oxygen at night she is on Plavix.  She presented with several day history of weakness due to poor oral intake.  No diarrhea or vomiting.  She has been seen and evaluated by Dr. Rush Landmark at [facility] gastroenterology.  Last office visit on 02/06/2020.  She carries a diagnosis of chronic pancreatitis.  He was well aware that the patient had a poor oral intake.  Had been initiated on cannabinoid therapy, pancreatic enzymes was encouraged to take 60 to 80 g of protein per day with shakes.  His plan was to perform a colonoscopy to rule out any pathology that could be contributing to the unintentional weight loss.  There was also consideration of a CT scan or MRI imaging to reevaluate the pancreas to rule out any neoplasm.  He did discuss about PEG tube but was on hold at that point of time.   When I went to see the patient she was accompanied by her sister.  She said that she had no difficulty with swallowing or getting the food down but rather had issues with a poor appetite.  She has had issues with nausea vomiting at times.  Past Medical History:  Diagnosis Date  . AAA (abdominal aortic aneurysm) ([place]) 5/09   3.7x3.3 by u/s 2009  . Abnormal EKG    deep TW inversions chronic  . Anemia   . CAD (coronary artery disease)    a.  CABG 03/2011 LIMA to LAD, SVG to OM, SVG to PDA. b. cath 06/25/2015 DES to SVG to rPDA, patent LIMA to LAD, patent SVG to OM  . carotid stenosis 5/09   S/p L CEA ;  60-79% bilat ICA by preCABG dopplers 7/12  . CHF (congestive heart failure) ([place])   . Chronic back pain    "all over" (06/24/2015)  . Chronic bronchitis ([place])    "get it pretty much q yr" (06/24/2015)  . CKD (chronic kidney disease)   . Complication of anesthesia 1966   "problem w/ether"  . COPD (chronic obstructive pulmonary disease) ([facility])   . Depression   . Dysrhythmia   . GERD (gastroesophageal reflux disease)    With hiatal hernia  . GIB (gastrointestinal bleeding) 9/11   S/p EGD with cautery at [facility]  . Heart murmur   . History of blood transfusion "a few times"   "all related to anemia" (06/24/2015)  . History of hiatal hernia   . Hyperlipidemia   . Hypertension   . Mitral regurgitation    3+ MR by intraoperative TEE;  s/p MV repair with Dr. Roxy Manns 7/12  . Myocardial infarction [facility]) 2012 "several"  . On home oxygen therapy    "2 liters at night; negative for sleep apnea"  .  PAD (peripheral artery disease) ([facility])    Severe; s/p bilateral renal artery stents, moderate in-stent restenosis  . Pancreatitis 05/2018  . Paroxysmal atrial fibrillation ([facility])    coumadin;  echo 9/07: EF 60%, mild LVH;  s/p Cox Maze 7/12 with LAA clipping  . Subclavian artery stenosis, left ([facility])    stented by Dr. Trula Slade on 10/18 to help flow of her LIMA to LAD  . Type II diabetes mellitus ([place])     Past Surgical History:  Procedure Laterality Date  . A/V FISTULAGRAM Left 03/21/2018   Procedure: A/V FISTULAGRAM;  Surgeon: Serafina Mitchell, MD;  Location: [facility];  Service: Cardiovascular;  Laterality: Left;  . ABDOMINAL AORTIC ENDOVASCULAR STENT GRAFT N/A 01/17/2018   Procedure: ABDOMINAL AORTIC ENDOVASCULAR STENT GRAFT WITH CO2;  Surgeon: Serafina Mitchell, MD;  Location: [facility];  Service: Vascular;  Laterality: N/A;  . ABDOMINAL  HYSTERECTOMY  1990's  . APPENDECTOMY  Aug. 11, 2016   Ruptured  . AV FISTULA PLACEMENT Left 11/03/2017   Procedure: ARTERIOVENOUS (AV) FISTULA CREATION LEFT ARM;  Surgeon: Serafina Mitchell, MD;  Location: [facility];  Service: Vascular;  Laterality: Left;  . BOWEL RESECTION  2013  . CARDIAC CATHETERIZATION N/A 06/24/2015   Procedure: Left Heart Cath and Cors/Grafts Angiography;  Surgeon: Leonie Man, MD;  Location: [facility];  Service: Cardiovascular;  Laterality: N/A;  . CARDIAC CATHETERIZATION N/A 06/25/2015   Procedure: Coronary Stent Intervention;  Surgeon: Sherren Mocha, MD;  Location: [facility];  Service: Cardiovascular;  Laterality: N/A;  . CAROTID ENDARTERECTOMY Left   . CATARACT EXTRACTION Right 03/27/2018  . CHOLECYSTECTOMY N/A 04/19/2018   Procedure: LAPAROSCOPIC CHOLECYSTECTOMY WITH INTRAOPERATIVE CHOLANGIOGRAM;  Surgeon: Rolm Bookbinder, MD;  Location: [facility];  Service: General;  Laterality: N/A;  . CORONARY ANGIOPLASTY    . CORONARY ARTERY BYPASS GRAFT  03/05/2011   CABG X3 (LIMA to LAD, SVG to OM, SVG to PDA, EVH via left thigh  . CORONARY STENT PLACEMENT    . ESOPHAGOGASTRODUODENOSCOPY N/A 04/12/2018   Procedure: ESOPHAGOGASTRODUODENOSCOPY (EGD);  Surgeon: [name], MD;  Location: [facility] ENDOSCOPY;  Service: Endoscopy;  Laterality: N/A;  . ESOPHAGOGASTRODUODENOSCOPY N/A 06/16/2018   Procedure: ESOPHAGOGASTRODUODENOSCOPY (EGD);  Surgeon: Thornton Park, MD;  Location: [facility];  Service: Gastroenterology;  Laterality: N/A;  . IR AV DIALY SHUNT INTRO NEEDLE/INTRACATH INITIAL W/PTA/IMG LEFT  01/23/2020  . IR RADIOLOGIST EVAL & MGMT  09/13/2019  . IR RADIOLOGIST EVAL & MGMT  10/31/2019  . IR RADIOLOGIST EVAL & MGMT  11/14/2019  . IR RADIOLOGIST EVAL & MGMT  01/02/2020  . IR US GUIDE VASC ACCESS LEFT  01/23/2020  . LACERATION REPAIR Right 1990's   WRIST  . MAZE  03/05/2011   complete biatrial lesion set with clipping of LA appendage  . MITRAL VALVE REPAIR   03/05/2011   77mm Memo 3D ring annuloplasty for ischemic MR  . PERIPHERAL VASCULAR BALLOON ANGIOPLASTY Left 03/21/2018   Procedure: PERIPHERAL VASCULAR BALLOON ANGIOPLASTY;  Surgeon: Serafina Mitchell, MD;  Location: [facility];  Service: Cardiovascular;  Laterality: Left;  . PERIPHERAL VASCULAR CATHETERIZATION N/A 06/24/2015   Procedure: Aortic Arch Angiography;  Surgeon: Serafina Mitchell, MD;  Location: [facility];  Service: Cardiovascular;  Laterality: N/A;  . PERIPHERAL VASCULAR CATHETERIZATION  06/24/2015   Procedure: Peripheral Vascular Intervention;  Surgeon: Serafina Mitchell, MD;  Location: [place] [facility];  Service: Cardiovascular;;  . PERIPHERAL VASCULAR CATHETERIZATION N/A 06/24/2015  Procedure: Abdominal Aortogram;  Surgeon: Serafina Mitchell, MD;  Location: [facility];  Service: Cardiovascular;  Laterality: N/A;  . RENAL ANGIOGRAM Bilateral 09/11/2013   Procedure: RENAL ANGIOGRAM;  Surgeon: Serafina Mitchell, MD;  Location: [facility] CATH LAB;  Service: Cardiovascular;  Laterality: Bilateral;  . RIGHT FEMORAL-POPLITEAL BYPASS      Prior to Admission medications   Medication Sig Start Date End Date Taking? Authorizing Provider  albuterol (VENTOLIN HFA) 108 (90 Base) MCG/ACT inhaler Inhale 1-2 puffs into the lungs every 6 (six) hours as needed for wheezing or shortness of breath.   Yes [provider]  allopurinol (ZYLOPRIM) 100 MG tablet Take 100 mg by mouth at bedtime.  02/02/17  Yes [provider]  atorvastatin (LIPITOR) 40 MG tablet Take 40 mg by mouth at bedtime.  12/07/17  Yes [provider]  B Complex-C-Zn-Folic Acid (DIALYVITE 502-DXAJ 15) 0.8 MG TABS Take 1 tablet by mouth daily. 03/31/20  Yes [provider]  clopidogrel (PLAVIX) 75 MG tablet Take 75 mg by mouth at bedtime.  10/02/19  Yes [provider]  dronabinol (MARINOL) 2.5 MG capsule Take 2.5 mg by mouth 2 (two) times daily before a meal.   Yes [provider]  gabapentin (NEURONTIN) 300 MG capsule Take 1 capsule (300 mg total) by mouth at bedtime. 08/22/18  Yes Mikhail, Maryann, DO  HYDROmorphone (DILAUDID) 2 MG tablet Take 2-4 mg by mouth every 4 (four) hours.    Yes [provider]  lidocaine-prilocaine (EMLA) cream Apply 1 application topically 3 (three) times a week. 03/31/20  Yes [provider]  metoprolol tartrate (LOPRESSOR) 25 MG tablet Take 0.5 tablets (12.5 mg total) by mouth 2 (two) times daily. Do not take in the mornings when you have dialysis. 02/22/20 02/16/21 Yes Sherren Mocha, MD  midodrine (PROAMATINE) 10 MG tablet TAKE 1 TABLET BY MOUTH AS DIRECTED BEFORE HEMODIALYSIS. MAY REPEAT 1 TIME 03/20/20  Yes [provider]  nitroGLYCERIN (NITROSTAT) 0.4 MG SL tablet Place 1 tablet (0.4 mg total) under the tongue every 5 (five) minutes as needed for chest pain. 02/22/20  Yes Sherren Mocha, MD  OXYGEN Inhale 2 L into the lungs at bedtime.    Yes [provider]  Pancrelipase, Lip-Prot-Amyl, (ZENPEP) 20000-63000 units CPEP Take 1 capsule by mouth 3 (three) times daily. 01/24/20  Yes Lucious Groves, DO  pantoprazole (PROTONIX) 40 MG tablet Take 1 tablet (40 mg total) by mouth daily. 08/03/18  Yes Patrecia Pour, MD  promethazine (PHENERGAN) 12.5 MG tablet Take 12.5 mg by mouth every 4 (four) hours as needed for nausea or vomiting.   Yes [provider]  sertraline (ZOLOFT) 50 MG tablet Take 50 mg by mouth daily. 04/02/20  Yes [provider]  traZODone (DESYREL) 100 MG tablet Take 150 mg by mouth at bedtime.  04/19/19  Yes [provider]  diphenhydrAMINE (BENADRYL) 25 MG tablet Take 50 mg by mouth at bedtime. Patient not taking: Reported on 04/20/2020    [provider]    Family History  Problem Relation Age of Onset  . Emphysema Mother   . Heart disease Mother        before age 71  . Hypertension Mother   . Hyperlipidemia Mother   . Heart attack Mother     . AAA (abdominal aortic aneurysm) Mother        rupture  . Heart attack Father 54  . Emphysema Father   . Heart disease Father  before age 80  . Hyperlipidemia Father   . Hypertension Father   . Peripheral vascular disease Father   . Diabetes Brother   . Heart disease Brother        before age 29  . Hyperlipidemia Brother   . Hypertension Brother   . Heart attack Brother        CABG  . Heart disease Other        Vascular disease in grandparents, uncles and dad  . Colon cancer Neg Hx   . Esophageal cancer Neg Hx   . Inflammatory bowel disease Neg Hx   . Liver disease Neg Hx   . Pancreatic cancer Neg Hx   . Rectal cancer Neg Hx   . Stomach cancer Neg Hx      Social History   Tobacco Use  . Smoking status: Current Every Day Smoker    Packs/day: 0.75    Years: 50.00    Pack years: 37.50    Types: Cigarettes  . Smokeless tobacco: Never Used  . Tobacco comment: 3/4 pk per day  Vaping Use  . Vaping Use: Never used  Substance Use Topics  . Alcohol use: Yes    Alcohol/week: 0.0 standard drinks  . Drug use: Yes    Types: Marijuana    Allergies as of 04/19/2020 - Review Complete 04/19/2020  Allergen Reaction Noted  . Isosorbide Other (See Comments) 10/28/2015  . Codeine Itching 10/28/2015  . Contrast media [iodinated diagnostic agents] Itching and Other (See Comments) 05/29/2012  . Ioxaglate Itching and Other (See Comments) 02/24/2015  . Isosorbide nitrate Other (See Comments) 10/28/2015  . Metrizamide Itching and Other (See Comments) 07/03/2014  . Oxycodone-acetaminophen Itching 10/28/2015  . Percocet [oxycodone-acetaminophen] Itching and Other (See Comments) 09/04/2013    Review of Systems:    All systems reviewed and negative except where noted in HPI.   Physical Exam:  Vital signs in last 24 hours: Temp:  [98.2 F (36.8 C)-99.2 F (37.3 C)] 98.2 F (36.8 C) (08/20 0925) Pulse Rate:  [61-73] 73 (08/20 0925) Resp:  [9-20] 18 (08/20 0925) BP:  (94-130)/(42-62) 127/60 (08/20 0925) SpO2:  [95 %-100 %] 100 % (08/20 0925) Last BM Date: 04/24/20 General:   Pleasant, cooperative in NAD Head:  Normocephalic and atraumatic. Eyes:   No icterus.   Conjunctiva pink. PERRLA. Ears:  Normal auditory acuity. Neurologic:  Alert and oriented x3;  grossly normal neurologically. Skin:  Intact without significant lesions or rashes. Psych:  Alert and cooperative. Normal affect.  LAB RESULTS: Recent Labs    04/23/20 0455 04/24/20 0416 04/25/20 0502  WBC 5.4 5.7 6.9  HGB 9.4* 8.8* 10.2*  HCT 27.6* 26.3* 29.4*  PLT 120* 127* 132*   BMET Recent Labs    04/23/20 0455 04/24/20 0416 04/25/20 0502  NA 138 141 137  K 3.4* 3.6 4.1  CL 103 105 104  CO2 26 26 27   GLUCOSE 81 92 108*  BUN 17 19 9   CREATININE 2.33* 2.63* 1.77*  CALCIUM 7.9* 7.9* 7.8*   LFT Recent Labs    04/24/20 0416  ALBUMIN 1.6*   PT/INR No results for input(s): LABPROT, INR in the last 72 hours.  STUDIES: DG Abd 1 View  Result Date: 04/23/2020 CLINICAL DATA:  Abdominal pain, decubitus ulcer EXAM: ABDOMEN - 1 VIEW COMPARISON:  07/26/2019 FINDINGS: Supine frontal view of the abdomen and pelvis demonstrates mild nonspecific gaseous distension of small bowel within the central abdomen, maximal diameter measuring 2.3 cm. Gas is seen throughout  the colon, with no evidence of high-grade obstruction. Stable endoluminal stent graft. No masses or abnormal calcifications. No acute bony abnormalities. IMPRESSION: 1. Nonspecific gaseous distension of the small bowel, which may reflect ileus or early/intermittent obstruction. No high-grade obstruction at this time. Electronically Signed   By: Randa Ngo M.D.   On: 04/23/2020 19:54      Impression / Plan:   KECHIA YAHNKE is a 69 y.o. y/o female with multiple medical comorbidities including end-stage renal disease, heart failure, chronic pancreatitis who is known to Dr. Rush Landmark who is her primary gastroenterologist for poor  oral intake.  Last office note on 02/06/2020 has a list of options on his plan to evaluate the weight loss including a colonoscopy and abdominal imaging.  There has been a plan to consider a G-tube placement for oral intake.  The patient is on Plavix.  Even if we did plan to perform a G-tube at this point of time would not be able to be done for at least 6 days.  Hence I would suggest that we could give a trial of a NG tube feed and this is something that the patient can also be discharged on to see how she tolerates it.  Following which I would suggest she closely follow-up with Dr. Rush Landmark to complete his evaluation and if negative would probably require a G-tube placement at that time off Plavix before the procedure.  I have explained in depth to the patient that the best form of nutrition is enteral preferably orally.  At this point of time she does not have any problem with swallowing and hence considering the risks of any procedure with the benefits it is better for her to try oral intake.  I also informed her that it is possible that the multiple medical problems that she has including renal failure, pulmonary failure, heart failure may be causing contributing to her cachexia and poor nutritional intake.  There is probably not going to be an easy fix for this.  A G-tube will provide an access to feed her but will not reduce the risk of nausea vomiting or aspiration.  I will sign off.  Please call me if any further GI concerns or questions.  We would like to thank you for the opportunity to participate in the care of [name].     Thank you for involving me in the care of this patient.      LOS: 5 days   Jonathon Bellows, MD  04/25/2020, 10:12 AM

## 2020-04-25 NOTE — Progress Notes (Signed)
[facility], [place] 04/25/20  Subjective:   LOS: 5  Patient is very lethargic today Resting quietly Her sister from out of town is at bedside Family is concerned about poor oral intake   Objective:  Vital signs in last 24 hours:  Temp:  [98.2 F (36.8 C)-98.8 F (37.1 C)] 98.8 F (37.1 C) (08/20 1141) Pulse Rate:  [62-73] 62 (08/20 1141) Resp:  [12-18] 14 (08/20 1141) BP: (98-129)/(54-62) 98/54 (08/20 1141) SpO2:  [98 %-100 %] 99 % (08/20 1141)  Weight change:  Filed Weights   04/19/20 1218 04/20/20 2033  Weight: 37.6 kg 36.7 kg    Intake/Output:    Intake/Output Summary (Last 24 hours) at 04/25/2020 1503 Last data filed at 04/24/2020 1727 Gross per 24 hour  Intake 240 ml  Output --  Net 240 ml    Physical Exam: General:  No acute distress, lying in bed,   HEENT   Oral mucous membrane moist, Sclerae and conjunctivae clear  Pulm/lungs  Respirations symmetrical with normal effort. Lungs-Clear b/l  CVS/Heart  S1S2, Irregular,no rub or gallop  Abdomen:   Soft, nontender  Extremities:  Bilateral lower extremity edema trace  Neurologic:  Lethargic but arousable, answers simple questions  Skin:  No acute rashes, or lesions  Left arm AV fistula    Basic Metabolic Panel:  Recent Labs  Lab 04/21/20 0345 04/21/20 0345 04/22/20 0443 04/22/20 0443 04/23/20 0455 04/24/20 0416 04/25/20 0502  NA 137  --  135  --  138 141 137  K 3.4*  --  3.3*  --  3.4* 3.6 4.1  CL 100  --  102  --  103 105 104  CO2 23  --  25  --  26 26 27   GLUCOSE 113*  --  93  --  81 92 108*  BUN 33*  --  33*  --  17 19 9   CREATININE 3.46*  --  3.34*  --  2.33* 2.63* 1.77*  CALCIUM 7.6*   < > 7.5*   < > 7.9* 7.9* 7.8*  MG 1.8  --  2.1  --  1.8 2.1 1.8  PHOS  --   --  4.0  --   --  2.9 1.4*   < > = values in this interval not displayed.     CBC: Recent Labs  Lab 04/21/20 0345 04/22/20 0443 04/23/20 0455 04/24/20 0416 04/25/20 0502  WBC 8.2 7.1 5.4 5.7  6.9  NEUTROABS 6.8 5.8 4.2  --   --   HGB 10.1* 9.2* 9.4* 8.8* 10.2*  HCT 28.0* 26.7* 27.6* 26.3* 29.4*  MCV 98.6 102.7* 103.8* 103.5* 101.7*  PLT 152 122* 120* 127* 132*      Lab Results  Component Value Date   HEPBSAG NON REACTIVE 04/22/2020   HEPBSAB Non Reactive 07/27/2018      Microbiology:  Recent Results (from the past 240 hour(s))  Urine culture     Status: Abnormal   Collection Time: 04/19/20 12:06 PM   Specimen: Urine, Random  Result Value Ref Range Status   Specimen Description   Final    URINE, RANDOM Performed at [facility], [address] [place], [place] [address]    Special Requests   Final    NONE Performed at [facility], [address] [place], [place] [address]    Culture (A)  Final    >=100,000 COLONIES/mL ESCHERICHIA COLI >=100,000 COLONIES/mL ENTEROCOCCUS FAECALIS    Report Status 04/22/2020 FINAL  Final   Organism ID, Bacteria ESCHERICHIA COLI (A)  Final   Organism ID, Bacteria ENTEROCOCCUS FAECALIS (A)  Final      Susceptibility   Escherichia coli - MIC*    AMPICILLIN >=32 RESISTANT Resistant     CEFAZOLIN <=4 SENSITIVE Sensitive     CEFTRIAXONE <=0.25 SENSITIVE Sensitive     CIPROFLOXACIN <=0.25 SENSITIVE Sensitive     GENTAMICIN <=1 SENSITIVE Sensitive     IMIPENEM <=0.25 SENSITIVE Sensitive     NITROFURANTOIN <=16 SENSITIVE Sensitive     TRIMETH/SULFA <=20 SENSITIVE Sensitive     AMPICILLIN/SULBACTAM >=32 RESISTANT Resistant     PIP/TAZO <=4 SENSITIVE Sensitive     * >=100,000 COLONIES/mL ESCHERICHIA COLI   Enterococcus faecalis - MIC*    AMPICILLIN <=2 SENSITIVE Sensitive     NITROFURANTOIN <=16 SENSITIVE Sensitive     VANCOMYCIN 2 SENSITIVE Sensitive     * >=100,000 COLONIES/mL ENTEROCOCCUS FAECALIS  SARS Coronavirus 2 by RT PCR (hospital order, performed in [place] hospital lab) Nasopharyngeal Nasopharyngeal Swab     Status: None   Collection Time: 04/20/20  1:46 AM   Specimen: Nasopharyngeal Swab   Result Value Ref Range Status   SARS Coronavirus 2 NEGATIVE NEGATIVE Final    Comment: (NOTE) SARS-CoV-2 target nucleic acids are NOT DETECTED.  The SARS-CoV-2 RNA is generally detectable in upper and lower respiratory specimens during the acute phase of infection. The lowest concentration of SARS-CoV-2 viral copies this assay can detect is 250 copies / mL. A negative result does not preclude SARS-CoV-2 infection and should not be used as the sole basis for treatment or other patient management decisions.  A negative result may occur with improper specimen collection / handling, submission of specimen other than nasopharyngeal swab, presence of viral mutation(s) within the areas targeted by this assay, and inadequate number of viral copies (<250 copies / mL). A negative result must be combined with clinical observations, patient history, and epidemiological information.  Fact Sheet for Patients:   StrictlyIdeas.no  Fact Sheet for Healthcare Providers: BankingDealers.co.za  This test is not yet approved or  cleared by the [place] FDA and has been authorized for detection and/or diagnosis of SARS-CoV-2 by FDA under an Emergency Use Authorization (EUA).  This EUA will remain in effect (meaning this test can be used) for the duration of the COVID-19 declaration under Section 564(b)(1) of the Act, 21 U.S.C. section 360bbb-3(b)(1), unless the authorization is terminated or revoked sooner.  Performed at [facility], [address] [place],  [address]     Coagulation Studies: No results for input(s): LABPROT, INR in the last 72 hours.  Urinalysis: No results for input(s): COLORURINE, LABSPEC, PHURINE, GLUCOSEU, HGBUR, BILIRUBINUR, KETONESUR, PROTEINUR, UROBILINOGEN, NITRITE, LEUKOCYTESUR in the last 72 hours.  Invalid input(s): APPERANCEUR    Imaging: DG Abd 1 View  Result Date: 04/23/2020 CLINICAL DATA:   Abdominal pain, decubitus ulcer EXAM: ABDOMEN - 1 VIEW COMPARISON:  07/26/2019 FINDINGS: Supine frontal view of the abdomen and pelvis demonstrates mild nonspecific gaseous distension of small bowel within the central abdomen, maximal diameter measuring 2.3 cm. Gas is seen throughout the colon, with no evidence of high-grade obstruction. Stable endoluminal stent graft. No masses or abnormal calcifications. No acute bony abnormalities. IMPRESSION: 1. Nonspecific gaseous distension of the small bowel, which may reflect ileus or early/intermittent obstruction. No high-grade obstruction at this time. Electronically Signed   By: Randa Ngo M.D.   On: 04/23/2020 19:54     Medications:   .  sodium phosphate  Dextrose 5% IVPB     . (feeding supplement) PROSource Plus  30 mL Oral TID BM  . allopurinol  100 mg Oral QHS  . vitamin C  500 mg Oral BID  . atorvastatin  40 mg Oral QHS  . Chlorhexidine Gluconate Cloth  6 each Topical Q0600  . clopidogrel  75 mg Oral QHS  . collagenase   Topical Daily  . dronabinol  2.5 mg Oral BID AC  . epoetin (EPOGEN/PROCRIT) injection  4,000 Units Intravenous Q T,Th,Sa-HD  . feeding supplement  1 Container Oral TID BM  . gabapentin  300 mg Oral QHS  . heparin  5,000 Units Subcutaneous Q8H  . HYDROmorphone  4 mg Oral Once  . lipase/protease/amylase  24,000 Units Oral TID  . metoprolol tartrate  12.5 mg Oral BID  . mirtazapine  15 mg Oral QHS  . multivitamin  1 tablet Oral QHS  . multivitamin-lutein  1 capsule Oral Daily  . nicotine  21 mg Transdermal Daily  . pantoprazole  40 mg Oral Daily  . potassium chloride  20 mEq Oral Once  . senna-docusate  1 tablet Oral BID  . sertraline  50 mg Oral Daily  . traZODone  150 mg Oral QHS   acetaminophen **OR** acetaminophen, albuterol, alum & mag hydroxide-simeth, diphenhydrAMINE, HYDROmorphone, midodrine, nitroGLYCERIN, ondansetron **OR** ondansetron (ZOFRAN) IV, promethazine  Assessment/ Plan:  69 y.o. female with  medical problems of ESRD, on hemodialysis, CAD/CABG 03/2011, COPD, atrial fibrillation, hypertension, COPD, chronic pancreatitis and chronic abdominal pain requiring recurrent hospitalizations , h/o AAA, cholecystectomy, severe peripheral arterial disease including bilateral renal artery stent, subclavian artery stenosis, left, left CEA  was admitted on 04/19/2020 for  Principal Problem:   Inadequate oral intake Active Problems:   Essential hypertension   Atrial fibrillation (HCC)   Chronic diastolic heart failure (HCC)   COPD with chronic bronchitis (HCC)   S/P CABG x 3   Diabetes mellitus (HCC)   Chronic kidney disease (CKD), stage IV (severe) (HCC)   Weakness   Severe protein-calorie malnutrition (HCC)   Pressure injury, stage 4 (HCC)   ESRD on hemodialysis ([place])   UTI (urinary tract infection)   Infected wound   Failure to thrive in adult   Pressure injury of sacral region, stage 1  Infected wound [T14.8XXA, L08.9] Weakness [R53.1] Failure to thrive in adult [R62.7] Inadequate oral intake [R63.8] Acute cystitis with hematuria [N30.01] Wound of right buttock, initial encounter [S31.819A] Pressure injury of sacral region, stage 1 [facility]/TTS/left arm AV fistula/CK [place]  #. ESRD Dialysis planned for tomorrow Will keep TTS schedule Regular diet to encourage intake     #. Anemia of CKD  Lab Results  Component Value Date   HGB 10.2 (L) 04/25/2020   Low dose EPO with HD  #. Secondary hyperparathyroidism of renal origin N 25.81      Component Value Date/Time   PTH 80 (H) 08/15/2018 1435   Lab Results  Component Value Date   PHOS 1.4 (L) 04/25/2020   Monitor calcium and phos level during this admission   #.  Rt Buttock wound Eval by wound care nurse S/p I & D rt ischial decub ulcer Concern about severe protein calorie malnutrition Getting oral protein supplements GI consult for evaluation for PEG tube  #Urinary tract infection E.  coli and Enterococcus faecalis Treated with IV Unasyn- completed   LOS: [place] 8/20/20213:03 PM  [facility] [place], [place]

## 2020-04-25 NOTE — Progress Notes (Signed)
Nutrition Follow-up  DOCUMENTATION CODES:   Severe malnutrition in context of chronic illness  INTERVENTION:  Continue Boost Breeze po TID, each supplement provides 250 kcal and 9 grams of protein  Continue Prosource 30 ml po TID, provides 100 kcal and 15 grams of protein  Continue Ocuvite daily for wound healing (provides zinc, vitamin A, vitamin C, Vitamin E, copper, and selenium)  Continue Rena-vite daily  Continue Vit C 500 mg BID  Patient refeeding, continue monitoring electrolytes daily until stabilized  Once G-tube is placed and if phosphorous >2 recommend initiating titrated bolus feedings: - Bolus 1/2 can of Osmolite 1.2 three times daily, bolus  2 full cans Osmolite twice daily. Give additional 1/2 can daily to goal 5 cans Osmolite 1.2 daily   Bolus regimen at goal rate will provide 1425 kcal, 66 grams of protein, and 975 ml free water  NUTRITION DIAGNOSIS:   Severe Malnutrition related to chronic illness (CHF, COPD, CABG, ESRD on HD, chronic pancreatits) as evidenced by severe fat depletion, severe muscle depletion, percent weight loss. -ongoing   GOAL:   Patient will meet greater than or equal to 90% of their needs -progressing  MONITOR:   PO intake, Supplement acceptance, Weight trends, Labs, Skin, I & O's  REASON FOR ASSESSMENT:   Consult Assessment of nutrition requirement/status  ASSESSMENT:   69 y.o. female with medical history significant for s/p CABG and DES, s/p mitral valve repair, ESRD on TTS HD, atrial fibrillation s/p Maze procedure 2012 not on anticoagulation, chronic diastolic CHF (EF 16-01%, U9NA by TTE 05/2018), PAD s/p b/l renal artery stents and left subclavian stent, AAA s/p endovascular repair 2019, chronic pancreatitis on chronic narcotics, COPD on 2 L supplemental O2 via [place] at night, HTN, HLD, RBBB, and depression, who presents to the emergency room with a several day history of weakness and poor oral intake  Pt s/p I&D rt ischial decub  ulcer 8/15  Patient with varied po intake during admission, per flowsheets she is eating 30-100% (70% average) x 4 documented meals. She is drinking ProSource Plus and Boost Breeze supplements TID. GI consulted for G-tube placement. Pharmacy is following for electrolyte monitoring and replacement.Tube feeding recommendation provided above. Will advance to goal slowly due to refeeding.   8/19 Last HD - Net UF 500 ml  Medications reviewed and include: ProSource Plus, Vit C, Marinol, Epogen, Gabapentin, Creon 24,000, Remeron, Rena-vit, Ocuvite, Klor-con, Senokot Mg sulfate IV 2 g once Sodium phosphate IV 20 mmol in D5 once  Labs: CBGs 134,104,137,114,60,71,73, Cr 1.77 (H), P 1.4 (L), Mg 1.8 (WNL) K 4.1 (WNL)   Diet Order:   Diet Order            Diet Heart Room service appropriate? Yes; Fluid consistency: Thin  Diet effective now                 EDUCATION NEEDS:   Education needs have been addressed  Skin:  Skin Assessment: Reviewed RN Assessment (Right buttock lesion and right medial foot lesion, ecchymosis)  Last BM:  8/15- type 7  Height:   Ht Readings from Last 1 Encounters:  04/20/20 5\' 5"  (1.651 m)    Weight:   Wt Readings from Last 1 Encounters:  04/20/20 36.7 kg    Ideal Body Weight:  56.8 kg  BMI:  Body mass index is 13.46 kg/m.  Estimated Nutritional Needs:   Kcal:  1300-1500kcal/day  Protein:  65-75g/day  Fluid:  UOP +1L   Lajuan Lines, RD, LDN Clinical Nutrition  After Hours/Weekend Pager # in [employer]

## 2020-04-26 DIAGNOSIS — R634 Abnormal weight loss: Secondary | ICD-10-CM

## 2020-04-26 DIAGNOSIS — R1310 Dysphagia, unspecified: Secondary | ICD-10-CM

## 2020-04-26 LAB — GLUCOSE, CAPILLARY
Glucose-Capillary: 86 mg/dL (ref 70–99)
Glucose-Capillary: 152 mg/dL — ABNORMAL HIGH (ref 70–99)
Glucose-Capillary: 119 mg/dL — ABNORMAL HIGH (ref 70–99)

## 2020-04-26 LAB — BASIC METABOLIC PANEL
Anion gap: 11 (ref 5–15)
BUN: 14 mg/dL (ref 8–23)
CO2: 26 mmol/L (ref 22–32)
Calcium: 8.1 mg/dL — ABNORMAL LOW (ref 8.9–10.3)
Chloride: 101 mmol/L (ref 98–111)
Creatinine, Ser: 2.42 mg/dL — ABNORMAL HIGH (ref 0.44–1.00)
GFR calc Af Amer: 23 mL/min — ABNORMAL LOW (ref >60)
GFR calc non Af Amer: 20 mL/min — ABNORMAL LOW (ref >60)
Glucose, Bld: 102 mg/dL — ABNORMAL HIGH (ref 70–99)
Potassium: 4.1 mmol/L (ref 3.5–5.1)
Sodium: 138 mmol/L (ref 135–145)

## 2020-04-26 LAB — CBC
HCT: 34.6 % — ABNORMAL LOW (ref 36.0–46.0)
Hemoglobin: 11.3 g/dL — ABNORMAL LOW (ref 12.0–15.0)
MCH: 34.8 pg — ABNORMAL HIGH (ref 26.0–34.0)
MCHC: 32.7 g/dL (ref 30.0–36.0)
MCV: 106.5 fL — ABNORMAL HIGH (ref 80.0–100.0)
Platelets: 210 10*3/uL (ref 150–400)
RBC: 3.25 MIL/uL — ABNORMAL LOW (ref 3.87–5.11)
RDW: 15.5 % (ref 11.5–15.5)
WBC: 8.6 10*3/uL (ref 4.0–10.5)
nRBC: 0 % (ref 0.0–0.2)

## 2020-04-26 LAB — MAGNESIUM: Magnesium: 2.7 mg/dL — ABNORMAL HIGH (ref 1.7–2.4)

## 2020-04-26 LAB — PHOSPHORUS: Phosphorus: 4.3 mg/dL (ref 2.5–4.6)

## 2020-04-26 MED ORDER — HYDROMORPHONE HCL 2 MG PO TABS
6.0000 mg | ORAL_TABLET | Freq: Four times a day (QID) | ORAL | Status: DC | PRN
  Administered 2020-04-26 (×2): 6 mg via ORAL
  Filled 2020-04-26 (×2): qty 3

## 2020-04-26 MED ORDER — GABAPENTIN 400 MG PO CAPS
400.0000 mg | ORAL_CAPSULE | Freq: Three times a day (TID) | ORAL | Status: DC
  Administered 2020-04-26 – 2020-05-02 (×17): 400 mg via ORAL
  Filled 2020-04-26 (×17): qty 1

## 2020-04-26 MED ORDER — GABAPENTIN 300 MG PO CAPS: 300.0000 mg | ORAL_CAPSULE | Freq: Three times a day (TID) | ORAL | Status: DC

## 2020-04-26 MED ORDER — DEXTROSE-NACL 5-0.9 % IV SOLN: INTRAVENOUS | Status: DC

## 2020-04-26 MED ORDER — HYDROMORPHONE HCL 2 MG PO TABS
4.0000 mg | ORAL_TABLET | ORAL | Status: DC | PRN
  Administered 2020-04-26 – 2020-05-04 (×25): 4 mg via ORAL
  Filled 2020-04-26 (×25): qty 2

## 2020-04-26 NOTE — Consult Note (Signed)
PHARMACY CONSULT NOTE  Pharmacy Consult for Electrolyte Monitoring and Replacement   Recent Labs: Potassium (mmol/L)  Date Value  04/26/2020 4.1   Magnesium (mg/dL)  Date Value  04/26/2020 2.7 (H)   Calcium (mg/dL)  Date Value  04/26/2020 8.1 (L)   Albumin (g/dL)  Date Value  04/24/2020 1.6 (L)   Phosphorus (mg/dL)  Date Value  04/26/2020 4.3   Sodium  Date Value  04/26/2020 138 mmol/L  07/03/2018 143   Corrected Ca:  9.7 mg/dL  Assessment: 69 year old female presented with weakness, buttock ulcer, and inadequate oral intake. Pt has a PMH of ESRD on HD TTS, CAD s/p CABG, a.fib, mitral valve repair, dCHF, PAD, bilateral renal artery stenosis, severe protein calorie malnutrition, chronic pancreatitis, COPD, HTN, and depression. Pt has a 2-year history of unintentional weight loss + poor appetite. GI has been consulted for placement of G-tube.   Goal of Therapy (given cardiac history):  Mg 2.0 - 2.4 mg/dL K 4.0 - 5.1 mmol/L All Other Electrolytes WNL  Plan:  K 4.1  Mag 2.7 Phos 4.3  Scr 2.42 -no replacement at this time -f/u closely for likelihood of refeeding syndrome -Monitor electrolytes with AM labs   Chinita Greenland PharmD Clinical Pharmacist 04/26/2020

## 2020-04-26 NOTE — Progress Notes (Signed)
Patient was nauseous and refused all oral medication. Zofran was given and patient is now resting.  Will continue to monitor.  Christene Slates

## 2020-04-26 NOTE — Progress Notes (Signed)
Amy Antigua, MD [address], [place], [place], [place], [address] [address], [place], [place], [place], [address] Phone: [phone]  Fax: [phone]   Subjective: Dr. Vicente Males was consulted on this pt and recommended DHT feeds. However, pt cannot go to nursing facility with [facility] and I am asked about PEG tube placement.  Pt states she has been losing weight due to poor appetite. She denies inability to swallow, but rather attributes her weight loss to poor appetite and early satiety. No nausea or vomiting.  Objective: Exam: Vital signs in last 24 hours: Vitals:   04/25/20 2041 04/26/20 0450 04/26/20 1234 04/26/20 1259  BP: 136/69 140/65 134/63 (!) 124/54  Pulse: 70 65  76  Resp: 16 16  18   Temp: 98.7 F (37.1 C) 98.5 F (36.9 C)  97.6 F (36.4 C)  TempSrc: Oral Oral  Oral  SpO2: 100% 100%  99%  Weight:      Height:       Weight change:   Intake/Output Summary (Last 24 hours) at 04/26/2020 1708 Last data filed at 04/26/2020 1600 Gross per 24 hour  Intake 251.76 ml  Output 400 ml  Net -148.24 ml    General: No acute distress, AAO x3 Abd: Soft, NT/ND, No HSM Skin: Warm, no rashes Neck: Supple, Trachea midline   Lab Results: Lab Results  Component Value Date   WBC 8.6 04/26/2020   HGB 11.3 (L) 04/26/2020   HCT 34.6 (L) 04/26/2020   MCV 106.5 (H) 04/26/2020   PLT 210 04/26/2020   Micro Results: Recent Results (from the past 240 hour(s))  Urine culture     Status: Abnormal   Collection Time: 04/19/20 12:06 PM   Specimen: Urine, Random  Result Value Ref Range Status   Specimen Description   Final    URINE, RANDOM Performed at [name], [address] [place], [place] [address]    Special Requests   Final    NONE Performed at [facility], [address] [place], [place] [address]    Culture (A)  Final    >=100,000 COLONIES/mL ESCHERICHIA COLI >=100,000 COLONIES/mL ENTEROCOCCUS FAECALIS    Report Status 04/22/2020  FINAL  Final   Organism ID, Bacteria ESCHERICHIA COLI (A)  Final   Organism ID, Bacteria ENTEROCOCCUS FAECALIS (A)  Final      Susceptibility   Escherichia coli - MIC*    AMPICILLIN >=32 RESISTANT Resistant     CEFAZOLIN <=4 SENSITIVE Sensitive     CEFTRIAXONE <=0.25 SENSITIVE Sensitive     CIPROFLOXACIN <=0.25 SENSITIVE Sensitive     GENTAMICIN <=1 SENSITIVE Sensitive     IMIPENEM <=0.25 SENSITIVE Sensitive     NITROFURANTOIN <=16 SENSITIVE Sensitive     TRIMETH/SULFA <=20 SENSITIVE Sensitive     AMPICILLIN/SULBACTAM >=32 RESISTANT Resistant     PIP/TAZO <=4 SENSITIVE Sensitive     * >=100,000 COLONIES/mL ESCHERICHIA COLI   Enterococcus faecalis - MIC*    AMPICILLIN <=2 SENSITIVE Sensitive     NITROFURANTOIN <=16 SENSITIVE Sensitive     VANCOMYCIN 2 SENSITIVE Sensitive     * >=100,000 COLONIES/mL ENTEROCOCCUS FAECALIS  SARS Coronavirus 2 by RT PCR (hospital order, performed in [place] hospital lab) Nasopharyngeal Nasopharyngeal Swab     Status: None   Collection Time: 04/20/20  1:46 AM   Specimen: Nasopharyngeal Swab  Result Value Ref Range Status   SARS Coronavirus 2 NEGATIVE NEGATIVE Final    Comment: (NOTE) SARS-CoV-2 target nucleic acids are NOT DETECTED.  The SARS-CoV-2 RNA is generally detectable in upper and lower respiratory specimens during the acute phase of infection. The lowest concentration of SARS-CoV-2 viral copies this assay can detect is 250 copies / mL. A negative result does not preclude SARS-CoV-2 infection and should not be used as the sole basis for treatment or other patient management decisions.  A negative result may occur with improper specimen collection / handling, submission of specimen other than nasopharyngeal swab, presence of viral mutation(s) within the areas targeted by this assay, and inadequate number of viral copies (<250 copies / mL). A negative result must be combined with clinical observations, patient history, and epidemiological  information.  Fact Sheet for Patients:   StrictlyIdeas.no  Fact Sheet for Healthcare Providers: BankingDealers.co.za  This test is not yet approved or  cleared by the [place] FDA and has been authorized for detection and/or diagnosis of SARS-CoV-2 by FDA under an Emergency Use Authorization (EUA).  This EUA will remain in effect (meaning this test can be used) for the duration of the COVID-19 declaration under Section 564(b)(1) of the Act, 21 U.S.C. section 360bbb-3(b)(1), unless the authorization is terminated or revoked sooner.  Performed at [facility], [address], [place], [place] [address]    Studies/Results: No results found. Medications:  Scheduled Meds: . (feeding supplement) PROSource Plus  30 mL Oral TID BM  . allopurinol  100 mg Oral QHS  . vitamin C  500 mg Oral BID  . atorvastatin  40 mg Oral QHS  . Chlorhexidine Gluconate Cloth  6 each Topical Q0600  . collagenase   Topical Daily  . dronabinol  2.5 mg Oral BID AC  . epoetin (EPOGEN/PROCRIT) injection  4,000 Units Intravenous Q T,Th,Sa-HD  . feeding supplement  1 Container Oral TID BM  . gabapentin  400 mg Oral TID  . heparin  5,000 Units Subcutaneous Q8H  . lipase/protease/amylase  24,000 Units Oral TID  . metoprolol tartrate  12.5 mg Oral BID  . mirtazapine  15 mg Oral QHS  . multivitamin  1 tablet Oral QHS  . multivitamin-lutein  1 capsule Oral Daily  . nicotine  21 mg Transdermal Daily  . pantoprazole  40 mg Oral Daily  . senna-docusate  1 tablet Oral BID  . sertraline  50 mg Oral Daily  . traZODone  150 mg Oral QHS   Continuous Infusions: . dextrose 5 % and 0.9% NaCl 50 mL/hr at 04/26/20 1536   PRN Meds:.acetaminophen **OR** acetaminophen, albuterol, alum & mag hydroxide-simeth, diphenhydrAMINE, HYDROmorphone, midodrine, nitroGLYCERIN, ondansetron **OR** ondansetron (ZOFRAN) IV, promethazine   Assessment: Principal Problem:    Inadequate oral intake Active Problems:   Essential hypertension   Atrial fibrillation (HCC)   Chronic diastolic heart failure (HCC)   COPD with chronic bronchitis (HCC)   S/P CABG x 3   Diabetes mellitus (HCC)   Chronic kidney disease (CKD), stage IV (severe) (HCC)   Weakness   Severe protein-calorie malnutrition (HCC)   Pressure injury, stage 4 (HCC)   ESRD on hemodialysis (HCC)   UTI (urinary tract infection)   Infected wound   Failure to thrive in adult   Pressure injury of sacral region, stage 1    Plan: A review of her previous notes, including detailed notes by her primary gastroenterologist, Dr. Rush Landmark reveals that patient has had unintentional weight loss of 20 to 30 pounds recently. Patient also has had chronic pancreatitis with unknown etiology and Dr. Donneta Romberg plan was to initiate work-up for unintentional weight loss with  possible colonoscopy, upper endoscopy and PEG tube was discussed but was on hold pending above work-up  Patient appears cachectic with minimal abdominal fat  She does states that intermittently she will feel food slowing down at the xiphoid notch but eventually passes. No prior history of food impaction. Also reports chronic nausea.  Patient also has had chronic pain and reports some depression  Her ongoing weight loss may be a combination of all above factors, including chronic pancreatitis, leading to chronic abdominal pain causing low appetite.  It appears she has had trouble obtaining and taking pancreatic enzymes consistently. Consistent pancreatic enzyme replacement therapy may help with her abdominal pain and appetite and may help her gain weight  At this time, before PEG tube placement can be done, I would recommend completion of the previously planned work-up for unintentional weight loss. This would include EGD and colonoscopy to rule out any underlying malignancy or concerning lesions. Especially since patient is reporting some  dysphagia  If PEG tube placement if needed prior to discharge, EGD and colonoscopy can be done as an inpatient after holding Plavix for 5 days. After EGD and colonoscopy without any large lesions feeding tube can be placed subsequently.  EGD and colonoscopy will first need to be done the procedure, we can determine if a good window is available for endoscopic feeding tube placement. If it is, then EGD would need to be repeated on a separate day with feeding tube placement. This is because, EGD would be done first and colonoscopy right after I would not recommend a feeding tube placement and then a colonoscopy on the same day. In addition, air insufflation use during colonoscopy will distend the colon, and would be best to let that air pass after the colonoscopy and repeat an EGD with PEG tube placement on a different day to lower the risks of perforation.  We will also have to keep in mind that the patient is cachectic and has minimal abdominal fat, if a safe window to place endoscopic feeding tube is not possible, we may need to consult IR for the same  Patient may need rectal tube placement to prevent infection of her buttock wound with diarrhea because during the colonoscopy prep  If patient agreeable to all the above, procedures can be done after day five Plavix  Above discussed with Dr. Billie Ruddy     LOS: 6 days   Amy Antigua, MD 04/26/2020, 5:08 PM

## 2020-04-26 NOTE — Progress Notes (Signed)
PROGRESS NOTE    [name]  ZOX:[id number] DOB: 23-Jun-1951 DOA: 04/19/2020 PCP: Bernerd Limbo, MD   Brief Narrative:  Briefly this is a 69 year old female with extensive past medical history of ESRD on hemodialysis TTS, CAD status post CABG, status post mitral valve repair, atrial fibrillation on anticoagulation, chronic diastolic congestive heart failure, PAD, bilateral renal artery stenosis, severe protein calorie malnutrition, chronic pancreatitis, chronic narcotic use, COPD, hypertension, depression.  She presented to the emergency department yesterday with weakness, generalized pain, acute on chronic pain of the right buttock ulcer.  The patient's been followed by wound care clinic.  However the wound had worsened and pain worsened to the point that the patient presented to the emergency department.  She is also markedly malnourished weight has dropped to 37.6 kg.  BMI 13.8.  8/16: Patient seen and examined.  Seen by general surgery yesterday.  Status post wound debridement at bedside.  Procalcitonin elevated.  Remains on Unasyn.  Nephrology following.  No immediate plans for dialysis.  Patient complaining of pain this morning.  States that her home Dilaudid is ineffective consistently requiring requesting more.  Also had questions about PEG tube placement.  I explained that she would likely need a colonoscopy or endoscopic evaluation prior and that focus at this time would be on treatment of the wound and associated infection.  Once this is resolved we can consider referral to interventional radiology and gastroenterology for colonoscopy PEG tube placement.  8/17: Patient seen and examined.  Still with persistent pain in the right buttock and right ankle.  Procalcitonin decreasing.  White blood cell count decreasing.  Infectious focus slowly resolving.  Again endorsing insufficient pain control.  Again revisited PEG tube issue.  Explained that patient will likely need endoscopic evaluation  prior and at this time the focus would be on resolution of her infection.  She expressed understanding.   Assessment & Plan:   Principal Problem:   Inadequate oral intake Active Problems:   Essential hypertension   Atrial fibrillation (HCC)   Chronic diastolic heart failure (HCC)   COPD with chronic bronchitis (HCC)   S/P CABG x 3   Diabetes mellitus (HCC)   Chronic kidney disease (CKD), stage IV (severe) (HCC)   Weakness   Severe protein-calorie malnutrition (HCC)   Pressure injury, stage 4 (HCC)   ESRD on hemodialysis (HCC)   UTI (urinary tract infection)   Infected wound   Failure to thrive in adult   Pressure injury of sacral region, stage 1  Infected right buttock wound Right ankle wound Patient presented with acute on chronic pain These wounds been present she is followed by the wound care clinic Recently had increased pain and increased foul-smelling drainage On admission had elevated procalcitonin was given Rocephin --completed 5 days of abx with Unasyn Plan: --continue home oral dilaudid 4 mg q4h PRN (do not increase) --increase home gabapentin to 400 mg TID --encourage sitting up and change position q2h --wound care --will need better nutrition for wound healing  Poor p.o. intake Severe protein calorie malnutrition Adult failure to thrive Patient has had a 2-year history of unintentional weight loss Endorses poor appetite's Was being worked up outpatient for a colonoscopy and potential PEG tube evaluation --albumin 1.6 --Plavix held since 8/20 Plan: --continue marinol and Remeron  --GI to consider workup and G-tube placement --continue D5 MIVF@50   Hypoglycemia 2/2 poor oral intake --ACHS BG checks to monitor for hypoglycemia --continue D5 MIVF@50   ESRD on hemodialysis iHD per nephrology  Urinary  tract infection Unclear whether this represents a true infection  Chronic hypotension On midodrine  Atrial fibrillation Currently rate  controlled Not on anticoagulants  Chronic diastolic congestive heart failure Currently euvolemic --avoid boluses  COPD with chronic bronchitis --stable. --continue home bronchodilator  CAD status post CABG x3 No acute issues  Diabetes mellitus, not currently active --A1c has been normal for the past year --No need for SSI  Chronic pain Chronic pancreatitis --IV dilaudid d/c'ed PLAN: --continue Creon with meals --continue home oral dilaudid 4 mg q4h PRN (do not increase)  Hypophos --replete PRN    DVT prophylaxis: Heparin SQ Code Status: Full code  Family Communication:  Status is: inpatient Dispo:   The patient is from: home Anticipated d/c is to: SNF Anticipated d/c date is: 5 days Patient currently is not medically stable to d/c due to: Need G-tube placement for reliable nutrition and wound healing, waiting on Plavix washout.    Consultants:   Nephrology  General surgery  Procedures:   Bedside wound debridement, 04/20/20  Antimicrobials:   Unasyn, 04/20/2020-   Subjective: Not interested in eating.  Only wanted her narcotics pain meds increased, reported more pain in her back/buttocks due to lying in bed.  No abdominal pain.    Objective: Vitals:   04/25/20 2041 04/26/20 0450 04/26/20 1234 04/26/20 1259  BP: 136/69 140/65 134/63 (!) 124/54  Pulse: 70 65  76  Resp: 16 16  18   Temp: 98.7 F (37.1 C) 98.5 F (36.9 C)  97.6 F (36.4 C)  TempSrc: Oral Oral  Oral  SpO2: 100% 100%  99%  Weight:      Height:        Intake/Output Summary (Last 24 hours) at 04/26/2020 1854 Last data filed at 04/26/2020 1600 Gross per 24 hour  Intake 139.48 ml  Output 400 ml  Net -260.52 ml   Filed Weights   04/19/20 1218 04/20/20 2033  Weight: 37.6 kg 36.7 kg    Examination:  Constitutional: NAD, AAOx3, cachectic HEENT: conjunctivae and lids normal, EOMI CV: RRR no M,R,G. Distal pulses +2.  No cyanosis.   RESP: CTA B/L, normal respiratory effort  GI:  +BS, NTND Extremities: No effusions, edema, or tenderness in BLE SKIN: warm, dry Neuro: II - XII grossly intact.  Sensation intact    Data Reviewed: I have personally reviewed following labs and imaging studies  CBC: Recent Labs  Lab 04/21/20 0345 04/21/20 0345 04/22/20 0443 04/23/20 0455 04/24/20 0416 04/25/20 0502 04/26/20 0522  WBC 8.2   < > 7.1 5.4 5.7 6.9 8.6  NEUTROABS 6.8  --  5.8 4.2  --   --   --   HGB 10.1*   < > 9.2* 9.4* 8.8* 10.2* 11.3*  HCT 28.0*   < > 26.7* 27.6* 26.3* 29.4* 34.6*  MCV 98.6   < > 102.7* 103.8* 103.5* 101.7* 106.5*  PLT 152   < > 122* 120* 127* 132* 210   < > = values in this interval not displayed.   Basic Metabolic Panel: Recent Labs  Lab 04/22/20 0443 04/23/20 0455 04/24/20 0416 04/25/20 0502 04/26/20 0522  NA 135 138 141 137 138  K 3.3* 3.4* 3.6 4.1 4.1  CL 102 103 105 104 101  CO2 25 26 26 27 26   GLUCOSE 93 81 92 108* 102*  BUN 33* 17 19 9 14   CREATININE 3.34* 2.33* 2.63* 1.77* 2.42*  CALCIUM 7.5* 7.9* 7.9* 7.8* 8.1*  MG 2.1 1.8 2.1 1.8 2.7*  PHOS 4.0  --  2.9 1.4* 4.3   GFR: Estimated Creatinine Clearance: 13.1 mL/min (A) (by C-G formula based on SCr of 2.42 mg/dL (H)). Liver Function Tests: Recent Labs  Lab 04/24/20 0416  ALBUMIN 1.6*   No results for input(s): LIPASE, AMYLASE in the last 168 hours. No results for input(s): AMMONIA in the last 168 hours. Coagulation Profile: No results for input(s): INR, PROTIME in the last 168 hours. Cardiac Enzymes: No results for input(s): CKTOTAL, CKMB, CKMBINDEX, TROPONINI in the last 168 hours. BNP (last 3 results) No results for input(s): PROBNP in the last 8760 hours. HbA1C: No results for input(s): HGBA1C in the last 72 hours. CBG: Recent Labs  Lab 04/25/20 1141 04/25/20 1656 04/25/20 2206 04/26/20 0807 04/26/20 1716  GLUCAP 134* 78 176* 86 152*   Lipid Profile: No results for input(s): CHOL, HDL, LDLCALC, TRIG, CHOLHDL, LDLDIRECT in the last 72 hours. Thyroid  Function Tests: No results for input(s): TSH, T4TOTAL, FREET4, T3FREE, THYROIDAB in the last 72 hours. Anemia Panel: No results for input(s): VITAMINB12, FOLATE, FERRITIN, TIBC, IRON, RETICCTPCT in the last 72 hours. Sepsis Labs: Recent Labs  Lab 04/20/20 2040 04/21/20 0345 04/22/20 0443  PROCALCITON 5.32 4.66 2.62    Recent Results (from the past 240 hour(s))  Urine culture     Status: Abnormal   Collection Time: 04/19/20 12:06 PM   Specimen: Urine, Random  Result Value Ref Range Status   Specimen Description   Final    URINE, RANDOM Performed at [facility], [address] [place], [place] [address]    Special Requests   Final    NONE Performed at [facility], [address] [place],  [address]    Culture (A)  Final    >=100,000 COLONIES/mL ESCHERICHIA COLI >=100,000 COLONIES/mL ENTEROCOCCUS FAECALIS    Report Status 04/22/2020 FINAL  Final   Organism ID, Bacteria ESCHERICHIA COLI (A)  Final   Organism ID, Bacteria ENTEROCOCCUS FAECALIS (A)  Final      Susceptibility   Escherichia coli - MIC*    AMPICILLIN >=32 RESISTANT Resistant     CEFAZOLIN <=4 SENSITIVE Sensitive     CEFTRIAXONE <=0.25 SENSITIVE Sensitive     CIPROFLOXACIN <=0.25 SENSITIVE Sensitive     GENTAMICIN <=1 SENSITIVE Sensitive     IMIPENEM <=0.25 SENSITIVE Sensitive     NITROFURANTOIN <=16 SENSITIVE Sensitive     TRIMETH/SULFA <=20 SENSITIVE Sensitive     AMPICILLIN/SULBACTAM >=32 RESISTANT Resistant     PIP/TAZO <=4 SENSITIVE Sensitive     * >=100,000 COLONIES/mL ESCHERICHIA COLI   Enterococcus faecalis - MIC*    AMPICILLIN <=2 SENSITIVE Sensitive     NITROFURANTOIN <=16 SENSITIVE Sensitive     VANCOMYCIN 2 SENSITIVE Sensitive     * >=100,000 COLONIES/mL ENTEROCOCCUS FAECALIS  SARS Coronavirus 2 by RT PCR (hospital order, performed in [place] hospital lab) Nasopharyngeal Nasopharyngeal Swab     Status: None   Collection Time: 04/20/20  1:46 AM   Specimen:  Nasopharyngeal Swab  Result Value Ref Range Status   SARS Coronavirus 2 NEGATIVE NEGATIVE Final    Comment: (NOTE) SARS-CoV-2 target nucleic acids are NOT DETECTED.  The SARS-CoV-2 RNA is generally detectable in upper and lower respiratory specimens during the acute phase of infection. The lowest concentration of SARS-CoV-2 viral copies this assay can detect is 250 copies / mL. A negative result does not preclude SARS-CoV-2 infection and should not be used as the sole basis for treatment or other patient management decisions.  A negative result  may occur with improper specimen collection / handling, submission of specimen other than nasopharyngeal swab, presence of viral mutation(s) within the areas targeted by this assay, and inadequate number of viral copies (<250 copies / mL). A negative result must be combined with clinical observations, patient history, and epidemiological information.  Fact Sheet for Patients:   StrictlyIdeas.no  Fact Sheet for Healthcare Providers: BankingDealers.co.za  This test is not yet approved or  cleared by the [place] FDA and has been authorized for detection and/or diagnosis of SARS-CoV-2 by FDA under an Emergency Use Authorization (EUA).  This EUA will remain in effect (meaning this test can be used) for the duration of the COVID-19 declaration under Section 564(b)(1) of the Act, 21 U.S.C. section 360bbb-3(b)(1), unless the authorization is terminated or revoked sooner.  Performed at [facility], [address] [place], [place] [address]          Radiology Studies: No results found.      Scheduled Meds: . (feeding supplement) PROSource Plus  30 mL Oral TID BM  . allopurinol  100 mg Oral QHS  . vitamin C  500 mg Oral BID  . atorvastatin  40 mg Oral QHS  . Chlorhexidine Gluconate Cloth  6 each Topical Q0600  . collagenase   Topical Daily  . dronabinol  2.5 mg Oral BID  AC  . epoetin (EPOGEN/PROCRIT) injection  4,000 Units Intravenous Q T,Th,Sa-HD  . feeding supplement  1 Container Oral TID BM  . gabapentin  400 mg Oral TID  . heparin  5,000 Units Subcutaneous Q8H  . lipase/protease/amylase  24,000 Units Oral TID  . metoprolol tartrate  12.5 mg Oral BID  . mirtazapine  15 mg Oral QHS  . multivitamin  1 tablet Oral QHS  . multivitamin-lutein  1 capsule Oral Daily  . nicotine  21 mg Transdermal Daily  . pantoprazole  40 mg Oral Daily  . senna-docusate  1 tablet Oral BID  . sertraline  50 mg Oral Daily  . traZODone  150 mg Oral QHS   Continuous Infusions: . dextrose 5 % and 0.9% NaCl 50 mL/hr at 04/26/20 1536     LOS: 6 days     Enzo Bi, MD [facility] Pager [phone]  If 7PM-7AM, please contact night-coverage 04/26/2020, 6:54 PM

## 2020-04-26 NOTE — Progress Notes (Signed)
[facility], [place] 04/26/20  Subjective:   LOS: 6  Patient seen during dialysis. Reports pain in the ankle Somewhat upset about increased interval of the pain medications that she is getting   HEMODIALYSIS FLOWSHEET:  Blood Flow Rate (mL/min): 350 mL/min Arterial Pressure (mmHg): -210 mmHg Venous Pressure (mmHg): 150 mmHg Transmembrane Pressure (mmHg): 40 mmHg Ultrafiltration Rate (mL/min): 170 mL/min Dialysate Flow Rate (mL/min): 800 ml/min Conductivity: Machine : 13.8 Conductivity: Machine : 13.8 Dialysis Fluid Bolus: Normal Saline Bolus Amount (mL): 250 mL     Objective:  Vital signs in last 24 hours:  Temp:  [98.5 F (36.9 C)-98.8 F (37.1 C)] 98.5 F (36.9 C) (08/21 0450) Pulse Rate:  [62-70] 65 (08/21 0450) Resp:  [14-16] 16 (08/21 0450) BP: (98-140)/(54-69) 140/65 (08/21 0450) SpO2:  [99 %-100 %] 100 % (08/21 0450)  Weight change:  Filed Weights   04/19/20 1218 04/20/20 2033  Weight: 37.6 kg 36.7 kg    Intake/Output:    Intake/Output Summary (Last 24 hours) at 04/26/2020 1113 Last data filed at 04/26/2020 0643 Gross per 24 hour  Intake 1401.22 ml  Output 400 ml  Net 1001.22 ml    Physical Exam: General:  No acute distress, lying in bed,   HEENT   Oral mucous membrane moist, Sclerae and conjunctivae clear  Pulm/lungs  Respirations symmetrical with normal effort. Lungs-Clear b/l  CVS/Heart  S1S2, Irregular,no rub or gallop  Abdomen:   Soft, nontender  Extremities:  Bilateral lower extremity edema trace  Neurologic:  Lethargic but arousable, answers simple questions  Skin:  No acute rashes, or lesions  Left arm AV fistula    Basic Metabolic Panel:  Recent Labs  Lab 04/22/20 0443 04/22/20 0443 04/23/20 0455 04/23/20 0455 04/24/20 0416 04/25/20 0502 04/26/20 0522  NA 135  --  138  --  141 137 138  K 3.3*  --  3.4*  --  3.6 4.1 4.1  CL 102  --  103  --  105 104 101  CO2 25  --  26  --  26 27 26   GLUCOSE 93   --  81  --  92 108* 102*  BUN 33*  --  17  --  19 9 14   CREATININE 3.34*  --  2.33*  --  2.63* 1.77* 2.42*  CALCIUM 7.5*   < > 7.9*   < > 7.9* 7.8* 8.1*  MG 2.1  --  1.8  --  2.1 1.8 2.7*  PHOS 4.0  --   --   --  2.9 1.4* 4.3   < > = values in this interval not displayed.     CBC: Recent Labs  Lab 04/21/20 0345 04/21/20 0345 04/22/20 0443 04/23/20 0455 04/24/20 0416 04/25/20 0502 04/26/20 0522  WBC 8.2   < > 7.1 5.4 5.7 6.9 8.6  NEUTROABS 6.8  --  5.8 4.2  --   --   --   HGB 10.1*   < > 9.2* 9.4* 8.8* 10.2* 11.3*  HCT 28.0*   < > 26.7* 27.6* 26.3* 29.4* 34.6*  MCV 98.6   < > 102.7* 103.8* 103.5* 101.7* 106.5*  PLT 152   < > 122* 120* 127* 132* 210   < > = values in this interval not displayed.      Lab Results  Component Value Date   HEPBSAG NON REACTIVE 04/22/2020   HEPBSAB Non Reactive 07/27/2018      Microbiology:  Recent Results (from the past 240 hour(s))  Urine culture     Status: Abnormal   Collection Time: 04/19/20 12:06 PM   Specimen: Urine, Random  Result Value Ref Range Status   Specimen Description   Final    URINE, RANDOM Performed at [facility], [address] [place], [place] [address]    Special Requests   Final    NONE Performed at [facility], [place], [place] [address]    Culture (A)  Final    >=100,000 COLONIES/mL ESCHERICHIA COLI >=100,000 COLONIES/mL ENTEROCOCCUS FAECALIS    Report Status 04/22/2020 FINAL  Final   Organism ID, Bacteria ESCHERICHIA COLI (A)  Final   Organism ID, Bacteria ENTEROCOCCUS FAECALIS (A)  Final      Susceptibility   Escherichia coli - MIC*    AMPICILLIN >=32 RESISTANT Resistant     CEFAZOLIN <=4 SENSITIVE Sensitive     CEFTRIAXONE <=0.25 SENSITIVE Sensitive     CIPROFLOXACIN <=0.25 SENSITIVE Sensitive     GENTAMICIN <=1 SENSITIVE Sensitive     IMIPENEM <=0.25 SENSITIVE Sensitive     NITROFURANTOIN <=16 SENSITIVE Sensitive     TRIMETH/SULFA <=20 SENSITIVE  Sensitive     AMPICILLIN/SULBACTAM >=32 RESISTANT Resistant     PIP/TAZO <=4 SENSITIVE Sensitive     * >=100,000 COLONIES/mL ESCHERICHIA COLI   Enterococcus faecalis - MIC*    AMPICILLIN <=2 SENSITIVE Sensitive     NITROFURANTOIN <=16 SENSITIVE Sensitive     VANCOMYCIN 2 SENSITIVE Sensitive     * >=100,000 COLONIES/mL ENTEROCOCCUS FAECALIS  SARS Coronavirus 2 by RT PCR (hospital order, performed in [place] hospital lab) Nasopharyngeal Nasopharyngeal Swab     Status: None   Collection Time: 04/20/20  1:46 AM   Specimen: Nasopharyngeal Swab  Result Value Ref Range Status   SARS Coronavirus 2 NEGATIVE NEGATIVE Final    Comment: (NOTE) SARS-CoV-2 target nucleic acids are NOT DETECTED.  The SARS-CoV-2 RNA is generally detectable in upper and lower respiratory specimens during the acute phase of infection. The lowest concentration of SARS-CoV-2 viral copies this assay can detect is 250 copies / mL. A negative result does not preclude SARS-CoV-2 infection and should not be used as the sole basis for treatment or other patient management decisions.  A negative result may occur with improper specimen collection / handling, submission of specimen other than nasopharyngeal swab, presence of viral mutation(s) within the areas targeted by this assay, and inadequate number of viral copies (<250 copies / mL). A negative result must be combined with clinical observations, patient history, and epidemiological information.  Fact Sheet for Patients:   StrictlyIdeas.no  Fact Sheet for Healthcare Providers: BankingDealers.co.za  This test is not yet approved or  cleared by the [place] FDA and has been authorized for detection and/or diagnosis of SARS-CoV-2 by FDA under an Emergency Use Authorization (EUA).  This EUA will remain in effect (meaning this test can be used) for the duration of the COVID-19 declaration under Section 564(b)(1) of the  Act, 21 U.S.C. section 360bbb-3(b)(1), unless the authorization is terminated or revoked sooner.  Performed at [facility], [address] [place], [place] [address]     Coagulation Studies: No results for input(s): LABPROT, INR in the last 72 hours.  Urinalysis: No results for input(s): COLORURINE, LABSPEC, PHURINE, GLUCOSEU, HGBUR, BILIRUBINUR, KETONESUR, PROTEINUR, UROBILINOGEN, NITRITE, LEUKOCYTESUR in the last 72 hours.  Invalid input(s): APPERANCEUR    Imaging: No results found.   Medications:   . dextrose 5 % and 0.9% NaCl     . (  feeding supplement) PROSource Plus  30 mL Oral TID BM  . allopurinol  100 mg Oral QHS  . vitamin C  500 mg Oral BID  . atorvastatin  40 mg Oral QHS  . Chlorhexidine Gluconate Cloth  6 each Topical Q0600  . collagenase   Topical Daily  . dronabinol  2.5 mg Oral BID AC  . epoetin (EPOGEN/PROCRIT) injection  4,000 Units Intravenous Q T,Th,Sa-HD  . feeding supplement  1 Container Oral TID BM  . gabapentin  300 mg Oral QHS  . heparin  5,000 Units Subcutaneous Q8H  . HYDROmorphone  4 mg Oral Once  . lipase/protease/amylase  24,000 Units Oral TID  . metoprolol tartrate  12.5 mg Oral BID  . mirtazapine  15 mg Oral QHS  . multivitamin  1 tablet Oral QHS  . multivitamin-lutein  1 capsule Oral Daily  . nicotine  21 mg Transdermal Daily  . pantoprazole  40 mg Oral Daily  . senna-docusate  1 tablet Oral BID  . sertraline  50 mg Oral Daily  . traZODone  150 mg Oral QHS   acetaminophen **OR** acetaminophen, albuterol, alum & mag hydroxide-simeth, diphenhydrAMINE, HYDROmorphone, midodrine, nitroGLYCERIN, ondansetron **OR** ondansetron (ZOFRAN) IV, promethazine  Assessment/ Plan:  69 y.o. female with medical problems of ESRD, on hemodialysis, CAD/CABG 03/2011, COPD, atrial fibrillation, hypertension, COPD, chronic pancreatitis and chronic abdominal pain requiring recurrent hospitalizations , h/o AAA, cholecystectomy, severe peripheral  arterial disease including bilateral renal artery stent, subclavian artery stenosis, left, left CEA  was admitted on 04/19/2020 for  Principal Problem:   Inadequate oral intake Active Problems:   Essential hypertension   Atrial fibrillation (HCC)   Chronic diastolic heart failure (HCC)   COPD with chronic bronchitis (HCC)   S/P CABG x 3   Diabetes mellitus (HCC)   Chronic kidney disease (CKD), stage IV (severe) (HCC)   Weakness   Severe protein-calorie malnutrition (HCC)   Pressure injury, stage 4 (HCC)   ESRD on hemodialysis ([place])   UTI (urinary tract infection)   Infected wound   Failure to thrive in adult   Pressure injury of sacral region, stage 1  Infected wound [T14.8XXA, L08.9] Weakness [R53.1] Failure to thrive in adult [R62.7] Inadequate oral intake [R63.8] Acute cystitis with hematuria [N30.01] Wound of right buttock, initial encounter [S31.819A] Pressure injury of sacral region, stage 1 [facility]/TTS/left arm AV fistula/CK [place]  #. ESRD Patient seen during dialysis Tolerating well  Will continue TTS schedule   #. Anemia of CKD  Lab Results  Component Value Date   HGB 11.3 (L) 04/26/2020   Low dose EPO with HD  #. Secondary hyperparathyroidism of renal origin N 25.81      Component Value Date/Time   PTH 80 (H) 08/15/2018 1435   Lab Results  Component Value Date   PHOS 4.3 04/26/2020   Monitor calcium and phos level during this admission   #.  Rt Buttock wound Eval by wound care nurse S/p I & D rt ischial decub ulcer Concern about severe protein calorie malnutrition Getting oral protein supplements GI consult for evaluation for PEG tube  #Urinary tract infection E. coli and Enterococcus faecalis Treated with IV Unasyn- completed  #Malnutrition Patient reports pain with swallowing.  She has very poor appetite.  Ate a few bites from [employer] yesterday Regular diet is ordered Discussed with patient that she could have  something from home if she wanted GI team evaluated for PEG tube.  Deferred due to recent Plavix use.  NG tube is being considered   LOS: [place] 8/21/202111:13 [place], [place]

## 2020-04-26 NOTE — Progress Notes (Signed)
Patient refused wound care due pain not controlled.  MD notified of pain, new orders given. However patient declined at this time.  Dressings are intact .

## 2020-04-27 LAB — CBC
HCT: 27.1 % — ABNORMAL LOW (ref 36.0–46.0)
Hemoglobin: 8.9 g/dL — ABNORMAL LOW (ref 12.0–15.0)
MCH: 34.9 pg — ABNORMAL HIGH (ref 26.0–34.0)
MCHC: 32.8 g/dL (ref 30.0–36.0)
MCV: 106.3 fL — ABNORMAL HIGH (ref 80.0–100.0)
Platelets: 133 10*3/uL — ABNORMAL LOW (ref 150–400)
RBC: 2.55 MIL/uL — ABNORMAL LOW (ref 3.87–5.11)
RDW: 15.6 % — ABNORMAL HIGH (ref 11.5–15.5)
WBC: 6.7 10*3/uL (ref 4.0–10.5)
nRBC: 0 % (ref 0.0–0.2)

## 2020-04-27 LAB — GLUCOSE, CAPILLARY
Glucose-Capillary: 86 mg/dL (ref 70–99)
Glucose-Capillary: 79 mg/dL (ref 70–99)
Glucose-Capillary: 96 mg/dL (ref 70–99)
Glucose-Capillary: 122 mg/dL — ABNORMAL HIGH (ref 70–99)
Glucose-Capillary: 120 mg/dL — ABNORMAL HIGH (ref 70–99)

## 2020-04-27 LAB — BASIC METABOLIC PANEL
Anion gap: 8 (ref 5–15)
BUN: 10 mg/dL (ref 8–23)
CO2: 27 mmol/L (ref 22–32)
Calcium: 6.7 mg/dL — ABNORMAL LOW (ref 8.9–10.3)
Chloride: 107 mmol/L (ref 98–111)
Creatinine, Ser: 1.82 mg/dL — ABNORMAL HIGH (ref 0.44–1.00)
GFR calc Af Amer: 33 mL/min — ABNORMAL LOW (ref >60)
GFR calc non Af Amer: 28 mL/min — ABNORMAL LOW (ref >60)
Glucose, Bld: 637 mg/dL (ref 70–99)
Potassium: 3 mmol/L — ABNORMAL LOW (ref 3.5–5.1)
Sodium: 142 mmol/L (ref 135–145)

## 2020-04-27 LAB — MAGNESIUM: Magnesium: 1.8 mg/dL (ref 1.7–2.4)

## 2020-04-27 LAB — POTASSIUM: Potassium: 4.8 mmol/L (ref 3.5–5.1)

## 2020-04-27 LAB — PHOSPHORUS
Phosphorus: 2.4 mg/dL — ABNORMAL LOW (ref 2.5–4.6)
Phosphorus: 4.9 mg/dL — ABNORMAL HIGH (ref 2.5–4.6)

## 2020-04-27 MED ORDER — POTASSIUM CHLORIDE 20 MEQ PO PACK
40.0000 meq | PACK | Freq: Once | ORAL | Status: AC
  Administered 2020-04-27: 40 meq via ORAL
  Filled 2020-04-27: qty 2

## 2020-04-27 MED ORDER — POTASSIUM PHOSPHATES 15 MMOLE/5ML IV SOLN
20.0000 mmol | Freq: Once | INTRAVENOUS | Status: AC
  Administered 2020-04-27: 20 mmol via INTRAVENOUS
  Filled 2020-04-27: qty 6.67

## 2020-04-27 MED ORDER — MAGNESIUM SULFATE IN D5W 1-5 GM/100ML-% IV SOLN
1.0000 g | Freq: Once | INTRAVENOUS | Status: AC
  Administered 2020-04-27: 1 g via INTRAVENOUS
  Filled 2020-04-27: qty 100

## 2020-04-27 MED ORDER — AMITRIPTYLINE HCL 10 MG PO TABS
10.0000 mg | ORAL_TABLET | Freq: Every day | ORAL | Status: DC
  Administered 2020-04-27 – 2020-05-01 (×5): 10 mg via ORAL
  Filled 2020-04-27 (×7): qty 1

## 2020-04-27 NOTE — Progress Notes (Signed)
Lab called with a critical blood glucose level of 637. I rechecked with glucometer and it was 86. Lab will need to redraw glucose level.   [name]

## 2020-04-27 NOTE — Consult Note (Signed)
PHARMACY CONSULT NOTE  Pharmacy Consult for Electrolyte Monitoring and Replacement   Recent Labs: Potassium (mmol/L)  Date Value  04/27/2020 4.8   Magnesium (mg/dL)  Date Value  04/27/2020 1.8   Calcium (mg/dL)  Date Value  04/27/2020 6.7 (L)   Albumin (g/dL)  Date Value  04/24/2020 1.6 (L)   Phosphorus (mg/dL)  Date Value  04/27/2020 4.9 (H)   Sodium  Date Value  04/27/2020 142 mmol/L  07/03/2018 143   Corrected Ca:  9.7 mg/dL  Assessment: 69 year old female presented with weakness, buttock ulcer, and inadequate oral intake. Pt has a PMH of ESRD on HD TTS, CAD s/p CABG, a.fib, mitral valve repair, dCHF, PAD, bilateral renal artery stenosis, severe protein calorie malnutrition, chronic pancreatitis, COPD, HTN, and depression. Pt has a 2-year history of unintentional weight loss + poor appetite. GI has been consulted for placement of G-tube.   Goal of Therapy (given cardiac history):  Mg 2.0 - 2.4 mg/dL K 4.0 - 5.1 mmol/L All Other Electrolytes WNL  Plan:  K 4.8 Phos 4.9  Hemodialysis pt. - no additional replacement needed at this time  -f/u closely for likelihood of refeeding syndrome -Monitor electrolytes with AM labs   Kristeen Miss PharmD Clinical Pharmacist 04/27/2020

## 2020-04-27 NOTE — Progress Notes (Signed)
PROGRESS NOTE    [name]  KVQ:[id number] DOB: 04-17-1951 DOA: 04/19/2020 PCP: Bernerd Limbo, MD   Brief Narrative:  Briefly this is a 69 year old female with extensive past medical history of ESRD on hemodialysis TTS, CAD status post CABG, status post mitral valve repair, atrial fibrillation on anticoagulation, chronic diastolic congestive heart failure, PAD, bilateral renal artery stenosis, severe protein calorie malnutrition, chronic pancreatitis, chronic narcotic use, COPD, hypertension, depression.  She presented to the emergency department yesterday with weakness, generalized pain, acute on chronic pain of the right buttock ulcer.  The patient's been followed by wound care clinic.  However the wound had worsened and pain worsened to the point that the patient presented to the emergency department.  She is also markedly malnourished weight has dropped to 37.6 kg.  BMI 13.8.   Assessment & Plan:   Principal Problem:   Inadequate oral intake Active Problems:   Essential hypertension   Atrial fibrillation (HCC)   Chronic diastolic heart failure (HCC)   COPD with chronic bronchitis (HCC)   S/P CABG x 3   Diabetes mellitus (HCC)   Chronic kidney disease (CKD), stage IV (severe) (HCC)   Weakness   Severe protein-calorie malnutrition (HCC)   Pressure injury, stage 4 (HCC)   ESRD on hemodialysis (HCC)   UTI (urinary tract infection)   Infected wound   Failure to thrive in adult   Pressure injury of sacral region, stage 1  # Infected right buttock wound, pressure ulcer, unstageable, full thickness tissue loss, POA # Right ankle wound, pressure ulcer, stage 2, POA Patient presented with acute on chronic pain These wounds have been present for a long time, she is followed by the wound care clinic Recently had increased pain and increased foul-smelling drainage On admission had elevated procalcitonin was given Rocephin --completed 5 days of abx with Unasyn Plan: --continue  home oral dilaudid 4 mg q4h PRN (do not increase) --continue gabapentin 400 mg TID --encourage sitting up and change position q2h --continue wound care and dressing change per order --will need better nutrition for wound healing  Poor p.o. intake Severe protein calorie malnutrition Adult failure to thrive Patient has had a 2-year history of weight loss due to reduced oral intake. Endorses poor appetite's Was being worked up outpatient for a colonoscopy and potential PEG tube evaluation --albumin 1.6 --Plavix held since 8/20 --GI consulted for consideration of G-tube placement Plan: --discontinue marinol and Remeron since no effect --continue D5 MIVF@50  --EGD and colonoscopy after plavix washout (5 days) and G-tube placement after  Hypoglycemia 2/2 poor oral intake --ACHS BG checks to monitor for hypoglycemia --continue D5 MIVF@50   ESRD on hemodialysis iHD per nephrology  Urinary tract infection Unclear whether this represents a true infection.   --Pt completed 5 days of IV abx.  Hx of Chronic hypotension, not currently active  Atrial fibrillation Currently rate controlled Not on anticoagulants  Chronic diastolic congestive heart failure Currently euvolemic --avoid boluses  COPD with chronic bronchitis --stable. --continue home bronchodilator  CAD status post CABG x3 No acute issues  Diabetes mellitus, not currently active --A1c has been normal for the past year --No need for SSI  Chronic pain on chronic narcotics  Chronic pancreatitis --IV dilaudid d/c'ed PLAN: --continue Creon with meals --continue home oral dilaudid 4 mg q4h PRN (do not increase) --continue gabapentin 400 mg TID --Start amitriptyline  Hypophos HypoKalemia --recheck and replete PRN    DVT prophylaxis: Heparin SQ Code Status: Full code  Family Communication:  Status is:  inpatient Dispo:   The patient is from: home Anticipated d/c is to: SNF Anticipated d/c date is: 5  days Patient currently is not medically stable to d/c due to: Need G-tube placement for reliable nutrition and wound healing, waiting on Plavix washout.    Consultants:   Nephrology  General surgery  Procedures:   Bedside wound debridement, 04/20/20  Antimicrobials:   Unasyn, 04/20/2020-   Subjective: Complained of pain in her sacral ulcer, pancrease, and ankle.      Objective: Vitals:   04/26/20 2056 04/27/20 0554 04/27/20 0922 04/27/20 1208  BP: (!) 105/54 (!) 136/56 (!) 142/66 135/68  Pulse: 66 65 84 75  Resp: 14 14    Temp: 98.6 F (37 C) 98.2 F (36.8 C)  97.9 F (36.6 C)  TempSrc: Oral Oral    SpO2: 100% 100%  100%  Weight:      Height:        Intake/Output Summary (Last 24 hours) at 04/27/2020 1704 Last data filed at 04/27/2020 0542 Gross per 24 hour  Intake 643.22 ml  Output --  Net 643.22 ml   Filed Weights   04/19/20 1218 04/20/20 2033  Weight: 37.6 kg 36.7 kg    Examination:  Constitutional: NAD, more alert, cachetic  HEENT: conjunctivae and lids normal, EOMI CV: RRR no M,R,G. Distal pulses +2.  No cyanosis.   RESP: CTA B/L, normal respiratory effort  GI: +BS, NTND Extremities: No effusions, edema in BLE SKIN: warm, dry.  Dressing over her right sacral ulcer Neuro: II - XII grossly intact.  Sensation intact Psych: depressed mood and affect.      Data Reviewed: I have personally reviewed following labs and imaging studies  CBC: Recent Labs  Lab 04/21/20 0345 04/21/20 0345 04/22/20 0443 04/22/20 0443 04/23/20 0455 04/24/20 0416 04/25/20 0502 04/26/20 0522 04/27/20 0531  WBC 8.2   < > 7.1   < > 5.4 5.7 6.9 8.6 6.7  NEUTROABS 6.8  --  5.8  --  4.2  --   --   --   --   HGB 10.1*   < > 9.2*   < > 9.4* 8.8* 10.2* 11.3* 8.9*  HCT 28.0*   < > 26.7*   < > 27.6* 26.3* 29.4* 34.6* 27.1*  MCV 98.6   < > 102.7*   < > 103.8* 103.5* 101.7* 106.5* 106.3*  PLT 152   < > 122*   < > 120* 127* 132* 210 133*   < > = values in this interval not  displayed.   Basic Metabolic Panel: Recent Labs  Lab 04/22/20 0443 04/22/20 0443 04/23/20 0455 04/24/20 0416 04/25/20 0502 04/26/20 0522 04/27/20 0531  NA 135   < > 138 141 137 138 142  K 3.3*   < > 3.4* 3.6 4.1 4.1 3.0*  CL 102   < > 103 105 104 101 107  CO2 25   < > 26 26 27 26 27   GLUCOSE 93   < > 81 92 108* 102* 637*  BUN 33*   < > 17 19 9 14 10   CREATININE 3.34*   < > 2.33* 2.63* 1.77* 2.42* 1.82*  CALCIUM 7.5*   < > 7.9* 7.9* 7.8* 8.1* 6.7*  MG 2.1   < > 1.8 2.1 1.8 2.7* 1.8  PHOS 4.0  --   --  2.9 1.4* 4.3 2.4*   < > = values in this interval not displayed.   GFR: Estimated Creatinine Clearance: 17.4 mL/min (A) (by  C-G formula based on SCr of 1.82 mg/dL (H)). Liver Function Tests: Recent Labs  Lab 04/24/20 0416  ALBUMIN 1.6*   No results for input(s): LIPASE, AMYLASE in the last 168 hours. No results for input(s): AMMONIA in the last 168 hours. Coagulation Profile: No results for input(s): INR, PROTIME in the last 168 hours. Cardiac Enzymes: No results for input(s): CKTOTAL, CKMB, CKMBINDEX, TROPONINI in the last 168 hours. BNP (last 3 results) No results for input(s): PROBNP in the last 8760 hours. HbA1C: No results for input(s): HGBA1C in the last 72 hours. CBG: Recent Labs  Lab 04/26/20 2057 04/27/20 0710 04/27/20 0806 04/27/20 1206 04/27/20 1652  GLUCAP 119* 86 79 96 122*   Lipid Profile: No results for input(s): CHOL, HDL, LDLCALC, TRIG, CHOLHDL, LDLDIRECT in the last 72 hours. Thyroid Function Tests: No results for input(s): TSH, T4TOTAL, FREET4, T3FREE, THYROIDAB in the last 72 hours. Anemia Panel: No results for input(s): VITAMINB12, FOLATE, FERRITIN, TIBC, IRON, RETICCTPCT in the last 72 hours. Sepsis Labs: Recent Labs  Lab 04/20/20 2040 04/21/20 0345 04/22/20 0443  PROCALCITON 5.32 4.66 2.62    Recent Results (from the past 240 hour(s))  Urine culture     Status: Abnormal   Collection Time: 04/19/20 12:06 PM   Specimen: Urine,  Random  Result Value Ref Range Status   Specimen Description   Final    URINE, RANDOM Performed at [facility], [address] [place], [place] [address]    Special Requests   Final    NONE Performed at [facility] [address] [place], [place] [address]    Culture (A)  Final    >=100,000 COLONIES/mL ESCHERICHIA COLI >=100,000 COLONIES/mL ENTEROCOCCUS FAECALIS    Report Status 04/22/2020 FINAL  Final   Organism ID, Bacteria ESCHERICHIA COLI (A)  Final   Organism ID, Bacteria ENTEROCOCCUS FAECALIS (A)  Final      Susceptibility   Escherichia coli - MIC*    AMPICILLIN >=32 RESISTANT Resistant     CEFAZOLIN <=4 SENSITIVE Sensitive     CEFTRIAXONE <=0.25 SENSITIVE Sensitive     CIPROFLOXACIN <=0.25 SENSITIVE Sensitive     GENTAMICIN <=1 SENSITIVE Sensitive     IMIPENEM <=0.25 SENSITIVE Sensitive     NITROFURANTOIN <=16 SENSITIVE Sensitive     TRIMETH/SULFA <=20 SENSITIVE Sensitive     AMPICILLIN/SULBACTAM >=32 RESISTANT Resistant     PIP/TAZO <=4 SENSITIVE Sensitive     * >=100,000 COLONIES/mL ESCHERICHIA COLI   Enterococcus faecalis - MIC*    AMPICILLIN <=2 SENSITIVE Sensitive     NITROFURANTOIN <=16 SENSITIVE Sensitive     VANCOMYCIN 2 SENSITIVE Sensitive     * >=100,000 COLONIES/mL ENTEROCOCCUS FAECALIS  SARS Coronavirus 2 by RT PCR (hospital order, performed in [facility] hospital lab) Nasopharyngeal Nasopharyngeal Swab     Status: None   Collection Time: 04/20/20  1:46 AM   Specimen: Nasopharyngeal Swab  Result Value Ref Range Status   SARS Coronavirus 2 NEGATIVE NEGATIVE Final    Comment: (NOTE) SARS-CoV-2 target nucleic acids are NOT DETECTED.  The SARS-CoV-2 RNA is generally detectable in upper and lower respiratory specimens during the acute phase of infection. The lowest concentration of SARS-CoV-2 viral copies this assay can detect is 250 copies / mL. A negative result does not preclude SARS-CoV-2 infection and should not be used as  the sole basis for treatment or other patient management decisions.  A negative result may occur with improper specimen collection / handling, submission of specimen other than  nasopharyngeal swab, presence of viral mutation(s) within the areas targeted by this assay, and inadequate number of viral copies (<250 copies / mL). A negative result must be combined with clinical observations, patient history, and epidemiological information.  Fact Sheet for Patients:   StrictlyIdeas.no  Fact Sheet for Healthcare Providers: BankingDealers.co.za  This test is not yet approved or  cleared by the [place] FDA and has been authorized for detection and/or diagnosis of SARS-CoV-2 by FDA under an Emergency Use Authorization (EUA).  This EUA will remain in effect (meaning this test can be used) for the duration of the COVID-19 declaration under Section 564(b)(1) of the Act, 21 U.S.C. section 360bbb-3(b)(1), unless the authorization is terminated or revoked sooner.  Performed at [facility], [address] [place], [place] [address]          Radiology Studies: No results found.      Scheduled Meds: . (feeding supplement) PROSource Plus  30 mL Oral TID BM  . allopurinol  100 mg Oral QHS  . vitamin C  500 mg Oral BID  . atorvastatin  40 mg Oral QHS  . Chlorhexidine Gluconate Cloth  6 each Topical Q0600  . collagenase   Topical Daily  . dronabinol  2.5 mg Oral BID AC  . epoetin (EPOGEN/PROCRIT) injection  4,000 Units Intravenous Q T,Th,Sa-HD  . feeding supplement  1 Container Oral TID BM  . gabapentin  400 mg Oral TID  . heparin  5,000 Units Subcutaneous Q8H  . lipase/protease/amylase  24,000 Units Oral TID  . metoprolol tartrate  12.5 mg Oral BID  . mirtazapine  15 mg Oral QHS  . multivitamin  1 tablet Oral QHS  . multivitamin-lutein  1 capsule Oral Daily  . nicotine  21 mg Transdermal Daily  . pantoprazole  40 mg  Oral Daily  . senna-docusate  1 tablet Oral BID  . sertraline  50 mg Oral Daily  . traZODone  150 mg Oral QHS   Continuous Infusions: . dextrose 5 % and 0.9% NaCl 50 mL/hr at 04/27/20 0542  . magnesium sulfate bolus IVPB    . potassium PHOSPHATE IVPB (in mmol) 20 mmol (04/27/20 1156)     LOS: 7 days     Enzo Bi, MD [facility] Pager [phone]  If 7PM-7AM, please contact night-coverage 04/27/2020, 5:04 PM

## 2020-04-27 NOTE — TOC Progression Note (Signed)
Transition of Care [facility]) - Progression Note    Patient Details  Name: [name] MRN: [id number] Date of Birth: 1950-09-07  Transition of Care [facility]) CM/SW Contact  Meriel Flavors, [place] Phone Number: 04/27/2020, 4:58 PM  Clinical Narrative:    CSW received authorization today [id number] SNF 04/27/2020 04/27/2020-04/30/2020 Approved 4/4/0 Level II 04/30/2020    Expected Discharge Plan: [place] Barriers to Discharge: Continued Medical Work up  Expected Discharge Plan and Services Expected Discharge Plan: [place] Acute Care Choice: Resumption of Svcs/PTA Provider Living arrangements for the past 2 months: Single Family Home                                       Social Determinants of Health (SDOH) Interventions    Readmission Risk Interventions Readmission Risk Prevention Plan 04/23/2020 04/22/2020 01/24/2020  Transportation Screening - Complete Complete  Medication Review Press photographer) - Complete Complete  PCP or Specialist appointment within 3-5 days of discharge - Complete Complete  HRI or Home Care Consult - Complete Complete  SW Recovery Care/Counseling Consult - Complete Complete  Palliative Care Screening Complete Not Applicable Complete  Skilled Nursing Facility Complete Not Applicable Not Applicable  Some recent data might be hidden

## 2020-04-27 NOTE — Consult Note (Signed)
[place] for Electrolyte Monitoring and Replacement   Recent Labs: Potassium (mmol/L)  Date Value  04/27/2020 3.0 (L)   Magnesium (mg/dL)  Date Value  04/27/2020 1.8   Calcium (mg/dL)  Date Value  04/27/2020 6.7 (L)   Albumin (g/dL)  Date Value  04/24/2020 1.6 (L)   Phosphorus (mg/dL)  Date Value  04/27/2020 2.4 (L)   Sodium  Date Value  04/27/2020 142 mmol/L  07/03/2018 143   Corrected Ca:  9.7 mg/dL  Assessment: 69 year old female presented with weakness, buttock ulcer, and inadequate oral intake. Pt has a PMH of ESRD on HD TTS, CAD s/p CABG, a.fib, mitral valve repair, dCHF, PAD, bilateral renal artery stenosis, severe protein calorie malnutrition, chronic pancreatitis, COPD, HTN, and depression. Pt has a 2-year history of unintentional weight loss + poor appetite. GI has been consulted for placement of G-tube.   Goal of Therapy (given cardiac history):  Mg 2.0 - 2.4 mg/dL K 4.0 - 5.1 mmol/L All Other Electrolytes WNL  Plan:  K 3.0  Mag 1.8 Phos 2.4    Hemodialysis pt. -MD has ordered KCL 40 meq PO x 1 and Potassium Phosphate 20 mmol IV x1 -will order Magnesium 1 gram IV  x1 -f/u closely for likelihood of refeeding syndrome -will recheck K/Phos at 2100 -Monitor electrolytes with AM labs   Chinita Greenland PharmD Clinical Pharmacist 04/27/2020

## 2020-04-28 ENCOUNTER — Inpatient Hospital Stay: Payer: Medicare Other

## 2020-04-28 LAB — BASIC METABOLIC PANEL
Anion gap: 9 (ref 5–15)
BUN: 12 mg/dL (ref 8–23)
CO2: 25 mmol/L (ref 22–32)
Calcium: 7.7 mg/dL — ABNORMAL LOW (ref 8.9–10.3)
Chloride: 102 mmol/L (ref 98–111)
Creatinine, Ser: 2.09 mg/dL — ABNORMAL HIGH (ref 0.44–1.00)
GFR calc Af Amer: 28 mL/min — ABNORMAL LOW (ref >60)
GFR calc non Af Amer: 24 mL/min — ABNORMAL LOW (ref >60)
Glucose, Bld: 125 mg/dL — ABNORMAL HIGH (ref 70–99)
Potassium: 5 mmol/L (ref 3.5–5.1)
Sodium: 136 mmol/L (ref 135–145)

## 2020-04-28 LAB — VITAMIN B12: Vitamin B-12: 559 pg/mL (ref 180–914)

## 2020-04-28 LAB — PHOSPHORUS: Phosphorus: 5.2 mg/dL — ABNORMAL HIGH (ref 2.5–4.6)

## 2020-04-28 LAB — CBC
HCT: 26.2 % — ABNORMAL LOW (ref 36.0–46.0)
Hemoglobin: 8.7 g/dL — ABNORMAL LOW (ref 12.0–15.0)
MCH: 34.9 pg — ABNORMAL HIGH (ref 26.0–34.0)
MCHC: 33.2 g/dL (ref 30.0–36.0)
MCV: 105.2 fL — ABNORMAL HIGH (ref 80.0–100.0)
Platelets: 147 10*3/uL — ABNORMAL LOW (ref 150–400)
RBC: 2.49 MIL/uL — ABNORMAL LOW (ref 3.87–5.11)
RDW: 15.7 % — ABNORMAL HIGH (ref 11.5–15.5)
WBC: 7.6 10*3/uL (ref 4.0–10.5)
nRBC: 0 % (ref 0.0–0.2)

## 2020-04-28 LAB — GLUCOSE, CAPILLARY
Glucose-Capillary: 91 mg/dL (ref 70–99)
Glucose-Capillary: 102 mg/dL — ABNORMAL HIGH (ref 70–99)
Glucose-Capillary: 118 mg/dL — ABNORMAL HIGH (ref 70–99)
Glucose-Capillary: 94 mg/dL (ref 70–99)

## 2020-04-28 LAB — IRON AND TIBC: Iron: 35 ug/dL (ref 28–170)

## 2020-04-28 LAB — MAGNESIUM: Magnesium: 2.1 mg/dL (ref 1.7–2.4)

## 2020-04-28 LAB — FOLATE: Folate: 50 ng/mL (ref >5.9)

## 2020-04-28 MED ORDER — BISACODYL 5 MG PO TBEC
10.0000 mg | DELAYED_RELEASE_TABLET | Freq: Once | ORAL | Status: AC
  Administered 2020-04-28: 10 mg via ORAL
  Filled 2020-04-28: qty 2

## 2020-04-28 MED ORDER — PEG 3350-KCL-NA BICARB-NACL 420 G PO SOLR
4000.0000 mL | Freq: Once | ORAL | Status: AC
  Administered 2020-04-28: 4000 mL via ORAL
  Filled 2020-04-28: qty 4000

## 2020-04-28 MED ORDER — LORAZEPAM 2 MG/ML IJ SOLN
1.0000 mg | Freq: Once | INTRAMUSCULAR | Status: AC
  Administered 2020-04-28: 1 mg via INTRAVENOUS
  Filled 2020-04-28: qty 1

## 2020-04-28 NOTE — Progress Notes (Signed)
Physical Therapy Treatment Patient Details Name: [name] MRN: [id number] DOB: 1951/08/15 Today's Date: 04/28/2020    History of Present Illness Pt presented to ER secondary to decreased PO intake, progressive weakness and inability to get OOB/mobilize in home environment; admitted for management of inadequate oral intake, UTI and sacral decub (s/p bedside debridement)    PT Comments    Pt resting in bed upon PT arrival; agreeable to PT session.  Min to mod assist x1 with bed mobility; CGA to min assist x1 with transfers; and CGA to min assist x1 to ambulate 40 feet x2 with RW (sitting rest break between ambulation trials).  Pt reporting LE's felt like "rubber" this morning and was concerned about ability to walk (initially LE's appeared shaky) but pt did fairly well and able to progress compared to last session.  Will continue to focus on strengthening and progressive functional mobility per pt tolerance.   Follow Up Recommendations  SNF     Equipment Recommendations  Rolling walker with 5" wheels;3in1 (PT)    Recommendations for Other Services OT consult     Precautions / Restrictions Precautions Precautions: Fall Precaution Comments: sacral decub, R lateral heel wound Restrictions Weight Bearing Restrictions: No    Mobility  Bed Mobility Overal bed mobility: Needs Assistance Bed Mobility: Supine to Sit;Sit to Supine     Supine to sit: Min assist;Mod assist;HOB elevated (assist for trunk) Sit to supine: Min assist;HOB elevated (assist for L LE)   General bed mobility comments: increased time to perform d/t pain in buttocks/sacrum  Transfers Overall transfer level: Needs assistance Equipment used: Rolling walker (2 wheeled) Transfers: Sit to/from Stand Sit to Stand: Min guard;Min assist         General transfer comment: x2 trials standing from bed; minimal assist to come to upright standing and control descent sitting  Ambulation/Gait Ambulation/Gait  assistance: Min guard;Min assist Gait Distance (Feet):  (40 feet x2) Assistive device: Rolling walker (2 wheeled)   Gait velocity: decreased   General Gait Details: decreased B LE step length/foot clearance/heelstrike; forward flexed posture; intermittent assist for balance with walking   Stairs             Wheelchair Mobility    Modified Rankin (Stroke Patients Only)       Balance Overall balance assessment: Needs assistance Sitting-balance support: No upper extremity supported;Feet supported Sitting balance-Leahy Scale: Good Sitting balance - Comments: steady sitting reaching within BOS   Standing balance support: Single extremity supported Standing balance-Leahy Scale: Poor Standing balance comment: pt requiring at least single UE support for static standing balance                            Cognition Arousal/Alertness: Awake/alert Behavior During Therapy: WFL for tasks assessed/performed Overall Cognitive Status: Within Functional Limits for tasks assessed                                        Exercises      General Comments   Nursing cleared pt for participation in physical therapy.  Pt agreeable to PT session.      Pertinent Vitals/Pain Pain Assessment: 0-10 Pain Location: buttocks, R lateral malleolus, pancreas Pain Descriptors / Indicators: Aching Pain Intervention(s): Limited activity within patient's tolerance;Monitored during session;Repositioned;Patient requesting pain meds-RN notified  Vitals (HR and O2 on room air) stable and [facility]  throughout treatment session.    Home Living                      Prior Function            PT Goals (current goals can now be found in the care plan section) Acute Rehab PT Goals Patient Stated Goal: to get my strength back PT Goal Formulation: With patient Time For Goal Achievement: 05/07/20 Potential to Achieve Goals: Fair Progress towards PT goals: Progressing toward  goals    Frequency    Min 2X/week      PT Plan Current plan remains appropriate    Co-evaluation              AM-PAC PT "6 Clicks" Mobility   Outcome Measure  Help needed turning from your back to your side while in a flat bed without using bedrails?: A Little Help needed moving from lying on your back to sitting on the side of a flat bed without using bedrails?: A Lot Help needed moving to and from a bed to a chair (including a wheelchair)?: A Little Help needed standing up from a chair using your arms (e.g., wheelchair or bedside chair)?: A Little Help needed to walk in hospital room?: A Little Help needed climbing 3-5 steps with a railing? : A Lot 6 Click Score: 16    End of Session Equipment Utilized During Treatment: Gait belt Activity Tolerance: Patient tolerated treatment well Patient left: in bed;with call bell/phone within reach;with bed alarm set;Other (comment) (pt laying on L side with pillow between her knees/LE's) Nurse Communication: Mobility status;Patient requests pain meds;Precautions PT Visit Diagnosis: Muscle weakness (generalized) (M62.81);Difficulty in walking, not elsewhere classified (R26.2)     Time: 1281-1886 PT Time Calculation (min) (ACUTE ONLY): 24 min  Charges:  $Gait Training: 8-22 mins $Therapeutic Activity: 8-22 mins                    Leitha Bleak, PT 04/28/20, 3:19 PM

## 2020-04-28 NOTE — TOC Progression Note (Signed)
Transition of Care [facility]) - Progression Note    Patient Details  Name: MITTIE KNITTEL MRN: [id number] Date of Birth: 27-Dec-1950  Transition of Care [facility]) CM/SW [place], LCSW Phone Number: 04/28/2020, 11:45 AM  Clinical Narrative:  Per MD, plan for EGD/colonoscopy tomorrow and G-tube placement Wednesday. Planning on a weekend discharge. [facility] admissions coordinator is aware.   Expected Discharge Plan: [place] Barriers to Discharge: Continued Medical Work up  Expected Discharge Plan and Services Expected Discharge Plan: [place] Acute Care Choice: Resumption of Svcs/PTA Provider Living arrangements for the past 2 months: Single Family Home                                       Social Determinants of Health (SDOH) Interventions    Readmission Risk Interventions Readmission Risk Prevention Plan 04/23/2020 04/22/2020 01/24/2020  Transportation Screening - Complete Complete  Medication Review Press photographer) - Complete Complete  PCP or Specialist appointment within 3-5 days of discharge - Complete Complete  HRI or Home Care Consult - Complete Complete  SW Recovery Care/Counseling Consult - Complete Complete  Palliative Care Screening Complete Not Applicable Complete  Skilled Nursing Facility Complete Not Applicable Not Applicable  Some recent data might be hidden

## 2020-04-28 NOTE — Progress Notes (Signed)
PROGRESS NOTE    [name]  DPO:[id number] DOB: 1951/06/30 DOA: 04/19/2020 PCP: Bernerd Limbo, MD   Brief Narrative:  Briefly this is a 69 year old Caucasian female with extensive past medical history of ESRD on hemodialysis TTS, CAD status post CABG, status post mitral valve repair, atrial fibrillation on anticoagulation, chronic diastolic congestive heart failure, PAD, bilateral renal artery stenosis, severe protein calorie malnutrition, chronic pancreatitis, chronic narcotic use, COPD, hypertension, depression.  She presented to the emergency department yesterday with weakness, generalized pain, acute on chronic pain of the right buttock ulcer.  The patient's been followed by wound care clinic.  However the wound had worsened and pain worsened to the point that the patient presented to the emergency department.  She is also markedly malnourished weight has dropped to 37.6 kg.  BMI 13.8.   Assessment & Plan:   Principal Problem:   Inadequate oral intake Active Problems:   Essential hypertension   Atrial fibrillation (HCC)   Chronic diastolic heart failure (HCC)   COPD with chronic bronchitis (HCC)   S/P CABG x 3   Diabetes mellitus (HCC)   Chronic kidney disease (CKD), stage IV (severe) (HCC)   Weakness   Severe protein-calorie malnutrition (HCC)   Pressure injury, stage 4 (HCC)   ESRD on hemodialysis (HCC)   UTI (urinary tract infection)   Infected wound   Failure to thrive in adult   Pressure injury of sacral region, stage 1  # Infected right buttock wound, pressure ulcer, unstageable, full thickness tissue loss, POA # Right ankle wound, pressure ulcer, stage 2, POA Patient presented with acute on chronic pain These wounds have been present for a long time, she is followed by the wound care clinic Recently had increased pain and increased foul-smelling drainage On admission had elevated procalcitonin was given Rocephin --completed 5 days of abx with Unasyn --currently  stable, afebrile, not leukocytosis, not septic. Plan: --continue home oral dilaudid 4 mg q4h PRN (do not increase) --continue gabapentin 400 mg TID --encourage sitting up and change position q2h --Re-consult wound care nurse today to review wound care and dressing changes --will need better nutrition for wound healing  Poor p.o. intake Severe protein calorie malnutrition Adult failure to thrive Patient has had a 2-year history of weight loss due to reduced oral intake. Endorses poor appetite's Was being worked up outpatient for a colonoscopy and potential PEG tube evaluation --albumin 1.6 --Plavix held since 8/20 --GI consulted for consideration of G-tube placement --Marinol and Remeron d/c'ed since no effect Plan: --colonoscopy and EGD tomorrow --May need NG tube for bowel prep and rectal tube insertion to avoid contamination of buttock wound. --continue D5 MIVF@50   Hypoglycemia 2/2 poor oral intake --ACHS BG checks to monitor for hypoglycemia --continue D5 MIVF@50   ESRD on hemodialysis iHD per nephrology  Urinary tract infection Unclear whether this represents a true infection.   --Pt completed 5 days of IV abx.  Hx of Chronic hypotension, not currently active  Atrial fibrillation Currently rate controlled Not on anticoagulants --continue metop  Chronic diastolic congestive heart failure Currently euvolemic --avoid boluses  COPD with chronic bronchitis --respiratory status table.  On albuterol inhaler PRN at home.  CAD status post CABG x3 No acute issues  Diabetes mellitus, not currently active --A1c has been normal for the past year --No need for SSI  Chronic pain on chronic narcotics  Chronic pancreatitis --IV dilaudid d/c'ed PLAN: --continue Creon with meals --continue home oral dilaudid 4 mg q4h PRN (do not increase) --continue gabapentin  400 mg TID --continue amitriptyline (new)  Hypophos HypoKalemia --recheck and replete  PRN  Anemia --anemia workup today --continue Epo with dialysis   DVT prophylaxis: Heparin SQ Code Status: Full code  Family Communication:  Status is: inpatient Dispo:   The patient is from: home Anticipated d/c is to: SNF Anticipated d/c date is: 4-5 days  Patient currently is not medically stable to d/c due to: Need G-tube placement for reliable nutrition and wound healing, waiting on Plavix washout.   Consultants:   Nephrology  General surgery  Procedures:   Bedside wound debridement, 04/20/20  Antimicrobials:   Unasyn, 04/20/2020-   Subjective: Again, pt asked me to increase her narcotics because now her PCP has planned to increase her narcotics regimen.  Not interested in eating because both liquid and solid cause burning sensation when swallowed.  Bowel prep today for colonoscopy and EGD tomorrow.    Objective: Vitals:   04/27/20 1920 04/28/20 0416 04/28/20 0856 04/28/20 1156  BP: 135/65 114/72 (!) 116/45 (!) 111/58  Pulse: 75 (!) 57 (!) 59 66  Resp: 18 20    Temp: 100.2 F (37.9 C) 98.6 F (37 C)  98.6 F (37 C)  TempSrc: Axillary Oral  Oral  SpO2: 96% 93%  96%  Weight:      Height:        Intake/Output Summary (Last 24 hours) at 04/28/2020 1525 Last data filed at 04/28/2020 1003 Gross per 24 hour  Intake 1326.58 ml  Output --  Net 1326.58 ml   Filed Weights   04/19/20 1218 04/20/20 2033  Weight: 37.6 kg 36.7 kg    Examination:  Constitutional: NAD, AAOx3, sitting at side of bed HEENT: conjunctivae and lids normal, EOMI CV: No cyanosis.   RESP: normal respiratory effort  Extremities: No effusions, edema in BLE Neuro: II - XII grossly intact.  Sensation intact Psych: Depressed mood and affect.      Data Reviewed: I have personally reviewed following labs and imaging studies  CBC: Recent Labs  Lab 04/22/20 0443 04/22/20 0443 04/23/20 0455 04/23/20 0455 04/24/20 0416 04/25/20 0502 04/26/20 0522 04/27/20 0531 04/28/20 0338   WBC 7.1   < > 5.4   < > 5.7 6.9 8.6 6.7 7.6  NEUTROABS 5.8  --  4.2  --   --   --   --   --   --   HGB 9.2*   < > 9.4*   < > 8.8* 10.2* 11.3* 8.9* 8.7*  HCT 26.7*   < > 27.6*   < > 26.3* 29.4* 34.6* 27.1* 26.2*  MCV 102.7*   < > 103.8*   < > 103.5* 101.7* 106.5* 106.3* 105.2*  PLT 122*   < > 120*   < > 127* 132* 210 133* 147*   < > = values in this interval not displayed.   Basic Metabolic Panel: Recent Labs  Lab 04/24/20 0416 04/24/20 0416 04/25/20 0502 04/26/20 0522 04/27/20 0531 04/27/20 2143 04/28/20 0338  NA 141  --  137 138 142  --  136  K 3.6   < > 4.1 4.1 3.0* 4.8 5.0  CL 105  --  104 101 107  --  102  CO2 26  --  27 26 27   --  25  GLUCOSE 92  --  108* 102* 637*  --  125*  BUN 19  --  9 14 10   --  12  CREATININE 2.63*  --  1.77* 2.42* 1.82*  --  2.09*  CALCIUM 7.9*  --  7.8* 8.1* 6.7*  --  7.7*  MG 2.1  --  1.8 2.7* 1.8  --  2.1  PHOS 2.9   < > 1.4* 4.3 2.4* 4.9* 5.2*   < > = values in this interval not displayed.   GFR: Estimated Creatinine Clearance: 15.1 mL/min (A) (by C-G formula based on SCr of 2.09 mg/dL (H)). Liver Function Tests: Recent Labs  Lab 04/24/20 0416  ALBUMIN 1.6*   No results for input(s): LIPASE, AMYLASE in the last 168 hours. No results for input(s): AMMONIA in the last 168 hours. Coagulation Profile: No results for input(s): INR, PROTIME in the last 168 hours. Cardiac Enzymes: No results for input(s): CKTOTAL, CKMB, CKMBINDEX, TROPONINI in the last 168 hours. BNP (last 3 results) No results for input(s): PROBNP in the last 8760 hours. HbA1C: No results for input(s): HGBA1C in the last 72 hours. CBG: Recent Labs  Lab 04/27/20 1206 04/27/20 1652 04/27/20 2116 04/28/20 0806 04/28/20 1155  GLUCAP 96 122* 120* 91 102*   Lipid Profile: No results for input(s): CHOL, HDL, LDLCALC, TRIG, CHOLHDL, LDLDIRECT in the last 72 hours. Thyroid Function Tests: No results for input(s): TSH, T4TOTAL, FREET4, T3FREE, THYROIDAB in the last 72  hours. Anemia Panel: No results for input(s): VITAMINB12, FOLATE, FERRITIN, TIBC, IRON, RETICCTPCT in the last 72 hours. Sepsis Labs: Recent Labs  Lab 04/22/20 0443  PROCALCITON 2.62    Recent Results (from the past 240 hour(s))  Urine culture     Status: Abnormal   Collection Time: 04/19/20 12:06 PM   Specimen: Urine, Random  Result Value Ref Range Status   Specimen Description   Final    URINE, RANDOM Performed at [facility], [address] [place], [place] [address]    Special Requests   Final    NONE Performed at [facility], [place], [place] [address]    Culture (A)  Final    >=100,000 COLONIES/mL ESCHERICHIA COLI >=100,000 COLONIES/mL ENTEROCOCCUS FAECALIS    Report Status 04/22/2020 FINAL  Final   Organism ID, Bacteria ESCHERICHIA COLI (A)  Final   Organism ID, Bacteria ENTEROCOCCUS FAECALIS (A)  Final      Susceptibility   Escherichia coli - MIC*    AMPICILLIN >=32 RESISTANT Resistant     CEFAZOLIN <=4 SENSITIVE Sensitive     CEFTRIAXONE <=0.25 SENSITIVE Sensitive     CIPROFLOXACIN <=0.25 SENSITIVE Sensitive     GENTAMICIN <=1 SENSITIVE Sensitive     IMIPENEM <=0.25 SENSITIVE Sensitive     NITROFURANTOIN <=16 SENSITIVE Sensitive     TRIMETH/SULFA <=20 SENSITIVE Sensitive     AMPICILLIN/SULBACTAM >=32 RESISTANT Resistant     PIP/TAZO <=4 SENSITIVE Sensitive     * >=100,000 COLONIES/mL ESCHERICHIA COLI   Enterococcus faecalis - MIC*    AMPICILLIN <=2 SENSITIVE Sensitive     NITROFURANTOIN <=16 SENSITIVE Sensitive     VANCOMYCIN 2 SENSITIVE Sensitive     * >=100,000 COLONIES/mL ENTEROCOCCUS FAECALIS  SARS Coronavirus 2 by RT PCR (hospital order, performed in [place] hospital lab) Nasopharyngeal Nasopharyngeal Swab     Status: None   Collection Time: 04/20/20  1:46 AM   Specimen: Nasopharyngeal Swab  Result Value Ref Range Status   SARS Coronavirus 2 NEGATIVE NEGATIVE Final    Comment: (NOTE) SARS-CoV-2 target  nucleic acids are NOT DETECTED.  The SARS-CoV-2 RNA is generally detectable in upper and lower respiratory specimens during the acute phase of infection. The lowest concentration of SARS-CoV-2  viral copies this assay can detect is 250 copies / mL. A negative result does not preclude SARS-CoV-2 infection and should not be used as the sole basis for treatment or other patient management decisions.  A negative result may occur with improper specimen collection / handling, submission of specimen other than nasopharyngeal swab, presence of viral mutation(s) within the areas targeted by this assay, and inadequate number of viral copies (<250 copies / mL). A negative result must be combined with clinical observations, patient history, and epidemiological information.  Fact Sheet for Patients:   StrictlyIdeas.no  Fact Sheet for Healthcare Providers: BankingDealers.co.za  This test is not yet approved or  cleared by the [place] FDA and has been authorized for detection and/or diagnosis of SARS-CoV-2 by FDA under an Emergency Use Authorization (EUA).  This EUA will remain in effect (meaning this test can be used) for the duration of the COVID-19 declaration under Section 564(b)(1) of the Act, 21 U.S.C. section 360bbb-3(b)(1), unless the authorization is terminated or revoked sooner.  Performed at [facility], [address] [place],  [address]          Radiology Studies: No results found.      Scheduled Meds: . (feeding supplement) PROSource Plus  30 mL Oral TID BM  . allopurinol  100 mg Oral QHS  . amitriptyline  10 mg Oral QHS  . vitamin C  500 mg Oral BID  . atorvastatin  40 mg Oral QHS  . Chlorhexidine Gluconate Cloth  6 each Topical Q0600  . collagenase   Topical Daily  . epoetin (EPOGEN/PROCRIT) injection  4,000 Units Intravenous Q T,Th,Sa-HD  . feeding supplement  1 Container Oral TID BM  .  gabapentin  400 mg Oral TID  . heparin  5,000 Units Subcutaneous Q8H  . lipase/protease/amylase  24,000 Units Oral TID  . metoprolol tartrate  12.5 mg Oral BID  . multivitamin  1 tablet Oral QHS  . multivitamin-lutein  1 capsule Oral Daily  . nicotine  21 mg Transdermal Daily  . pantoprazole  40 mg Oral Daily  . polyethylene glycol-electrolytes  4,000 mL Oral Once  . senna-docusate  1 tablet Oral BID  . sertraline  50 mg Oral Daily  . traZODone  150 mg Oral QHS   Continuous Infusions: . dextrose 5 % and 0.9% NaCl Stopped (04/27/20 1738)     LOS: 8 days     Enzo Bi, MD [facility] Pager [phone]  If 7PM-7AM, please contact night-coverage 04/28/2020, 3:25 PM

## 2020-04-28 NOTE — Care Management Important Message (Signed)
Important Message  Patient Details  Name: [name] MRN: [id number] Date of Birth: November 19, 1950   Medicare Important Message Given:  Yes     Dannette Barbara 04/28/2020, 12:46 PM

## 2020-04-28 NOTE — Progress Notes (Signed)
Patient requested ativan before NG tube placement. Notified Rufina Falco and appropriate orders were placed. Will continue to monitor.  Christene Slates

## 2020-04-28 NOTE — Consult Note (Signed)
PHARMACY CONSULT NOTE  Pharmacy Consult for Electrolyte Monitoring and Replacement   Recent Labs: Potassium (mmol/L)  Date Value  04/28/2020 5.0   Magnesium (mg/dL)  Date Value  04/28/2020 2.1   Calcium (mg/dL)  Date Value  04/28/2020 7.7 (L)   Albumin (g/dL)  Date Value  04/24/2020 1.6 (L)   Phosphorus (mg/dL)  Date Value  04/28/2020 5.2 (H)   Sodium  Date Value  04/28/2020 136 mmol/L  07/03/2018 143   Corrected Ca:  9.62 mg/dL  Assessment: 70 year old female presented with weakness, buttock ulcer, and inadequate oral intake. Pt has a PMH of ESRD on HD TTS, CAD s/p CABG, a.fib, mitral valve repair, dCHF, PAD, bilateral renal artery stenosis, severe protein calorie malnutrition, chronic pancreatitis, COPD, HTN, and depression. Pt has a 2-year history of unintentional weight loss + poor appetite. GI has been consulted for placement of G-tube.  Goal of Therapy (given cardiac history):  Mg 2.0 - 2.4 mg/dL K 4.0 - 5.1 mmol/L All Other Electrolytes WNL  Plan:  Phos 5.2 - next dialysis is tomorrow (8/24) based on schedule -no additional replacement needed at this time  -f/u closely for likelihood of refeeding syndrome -Monitor electrolytes with AM labs   Sherilyn Banker, PharmD Pharmacy Resident  04/28/2020 7:08 AM

## 2020-04-28 NOTE — Progress Notes (Signed)
Mobility Specialist - Progress Note   04/28/20 1421  Mobility  Activity Ambulated in room  Level of Assistance Standby assist, set-up cues, supervision of patient - no hands on (CGA for safety precautions)  Assistive Device Front wheel walker  Distance Ambulated (ft) 30 ft  Mobility Response Tolerated well  Mobility performed by Mobility specialist  $Mobility charge 1 Mobility    Pre-mobility: 66 HR, 95% SpO2 Post-mobility: 68 HR, 92% SpO2   Pt was laying in bed upon arrival. Pt agreed to mobility session. Pt c/o pain in R lateral buttock region and R ankle. However, is still willing to participate in session. Pt able to get to EOB SBA. Pt ambulated in room 2x from EOB to room door w/ SBA. CGA utilized for safety precautions. Overall, pt tolerated session well. She states her pain decreased after getting OOB. Pt left in bed w/ bed alarm set. Call bell and phone placed in reach. Nurse was notified.    Damien Batty Mobility Specialist  04/28/20, 2:27 PM

## 2020-04-28 NOTE — Progress Notes (Signed)
Amy Antigua, MD [address], [place], [place], [place], [address] [address], [place], [place], [place], [address] Phone: [phone]  Fax: [phone]   Subjective: Pt resting in bed. Reporting mild dysphagia to solids. No N/V   Objective: Exam: Vital signs in last 24 hours: Vitals:   04/27/20 1208 04/27/20 1920 04/28/20 0416 04/28/20 0856  BP: 135/68 135/65 114/72 (!) 116/45  Pulse: 75 75 (!) 57 (!) 59  Resp:  18 20   Temp: 97.9 F (36.6 C) 100.2 F (37.9 C) 98.6 F (37 C)   TempSrc:  Axillary Oral   SpO2: 100% 96% 93%   Weight:      Height:       Weight change:   Intake/Output Summary (Last 24 hours) at 04/28/2020 1057 Last data filed at 04/28/2020 1003 Gross per 24 hour  Intake 1326.58 ml  Output --  Net 1326.58 ml    General: No acute distress, AAO x3 Abd: Soft, NT/ND, No HSM Skin: Warm, no rashes Neck: Supple, Trachea midline   Lab Results: Lab Results  Component Value Date   WBC 7.6 04/28/2020   HGB 8.7 (L) 04/28/2020   HCT 26.2 (L) 04/28/2020   MCV 105.2 (H) 04/28/2020   PLT 147 (L) 04/28/2020   Micro Results: Recent Results (from the past 240 hour(s))  Urine culture     Status: Abnormal   Collection Time: 04/19/20 12:06 PM   Specimen: Urine, Random  Result Value Ref Range Status   Specimen Description   Final    URINE, RANDOM Performed at [facility], [address] [place], [place] [address]    Special Requests   Final    NONE Performed at [facility], [address] [place], [place] [address]    Culture (A)  Final    >=100,000 COLONIES/mL ESCHERICHIA COLI >=100,000 COLONIES/mL ENTEROCOCCUS FAECALIS    Report Status 04/22/2020 FINAL  Final   Organism ID, Bacteria ESCHERICHIA COLI (A)  Final   Organism ID, Bacteria ENTEROCOCCUS FAECALIS (A)  Final      Susceptibility   Escherichia coli - MIC*    AMPICILLIN >=32 RESISTANT Resistant     CEFAZOLIN <=4 SENSITIVE Sensitive     CEFTRIAXONE <=0.25  SENSITIVE Sensitive     CIPROFLOXACIN <=0.25 SENSITIVE Sensitive     GENTAMICIN <=1 SENSITIVE Sensitive     IMIPENEM <=0.25 SENSITIVE Sensitive     NITROFURANTOIN <=16 SENSITIVE Sensitive     TRIMETH/SULFA <=20 SENSITIVE Sensitive     AMPICILLIN/SULBACTAM >=32 RESISTANT Resistant     PIP/TAZO <=4 SENSITIVE Sensitive     * >=100,000 COLONIES/mL ESCHERICHIA COLI   Enterococcus faecalis - MIC*    AMPICILLIN <=2 SENSITIVE Sensitive     NITROFURANTOIN <=16 SENSITIVE Sensitive     VANCOMYCIN 2 SENSITIVE Sensitive     * >=100,000 COLONIES/mL ENTEROCOCCUS FAECALIS  SARS Coronavirus 2 by RT PCR (hospital order, performed in [place] hospital lab) Nasopharyngeal Nasopharyngeal Swab     Status: None   Collection Time: 04/20/20  1:46 AM   Specimen: Nasopharyngeal Swab  Result Value Ref Range Status   SARS Coronavirus 2 NEGATIVE NEGATIVE Final    Comment: (NOTE) SARS-CoV-2 target nucleic acids are NOT DETECTED.  The SARS-CoV-2 RNA is generally detectable in upper and lower respiratory specimens during the acute phase of infection. The lowest concentration of SARS-CoV-2 viral copies this assay can detect is 250 copies / mL. A negative result does not preclude SARS-CoV-2 infection and should not be used  as the sole basis for treatment or other patient management decisions.  A negative result may occur with improper specimen collection / handling, submission of specimen other than nasopharyngeal swab, presence of viral mutation(s) within the areas targeted by this assay, and inadequate number of viral copies (<250 copies / mL). A negative result must be combined with clinical observations, patient history, and epidemiological information.  Fact Sheet for Patients:   StrictlyIdeas.no  Fact Sheet for Healthcare Providers: BankingDealers.co.za  This test is not yet approved or  cleared by the [place] FDA and has been authorized for  detection and/or diagnosis of SARS-CoV-2 by FDA under an Emergency Use Authorization (EUA).  This EUA will remain in effect (meaning this test can be used) for the duration of the COVID-19 declaration under Section 564(b)(1) of the Act, 21 U.S.C. section 360bbb-3(b)(1), unless the authorization is terminated or revoked sooner.  Performed at [facility], [address], [place], [place] [address]    Studies/Results: No results found. Medications:  Scheduled Meds: . (feeding supplement) PROSource Plus  30 mL Oral TID BM  . allopurinol  100 mg Oral QHS  . amitriptyline  10 mg Oral QHS  . vitamin C  500 mg Oral BID  . atorvastatin  40 mg Oral QHS  . Chlorhexidine Gluconate Cloth  6 each Topical Q0600  . collagenase   Topical Daily  . epoetin (EPOGEN/PROCRIT) injection  4,000 Units Intravenous Q T,Th,Sa-HD  . feeding supplement  1 Container Oral TID BM  . gabapentin  400 mg Oral TID  . heparin  5,000 Units Subcutaneous Q8H  . lipase/protease/amylase  24,000 Units Oral TID  . metoprolol tartrate  12.5 mg Oral BID  . multivitamin  1 tablet Oral QHS  . multivitamin-lutein  1 capsule Oral Daily  . nicotine  21 mg Transdermal Daily  . pantoprazole  40 mg Oral Daily  . senna-docusate  1 tablet Oral BID  . sertraline  50 mg Oral Daily  . traZODone  150 mg Oral QHS   Continuous Infusions: . dextrose 5 % and 0.9% NaCl Stopped (04/27/20 1738)   PRN Meds:.acetaminophen **OR** acetaminophen, albuterol, alum & mag hydroxide-simeth, diphenhydrAMINE, HYDROmorphone, midodrine, nitroGLYCERIN, ondansetron **OR** ondansetron (ZOFRAN) IV, promethazine   Assessment: Principal Problem:   Inadequate oral intake Active Problems:   Essential hypertension   Atrial fibrillation (HCC)   Chronic diastolic heart failure (HCC)   COPD with chronic bronchitis (HCC)   S/P CABG x 3   Diabetes mellitus (HCC)   Chronic kidney disease (CKD), stage IV (severe) (HCC)   Weakness   Severe  protein-calorie malnutrition (HCC)   Pressure injury, stage 4 (HCC)   ESRD on hemodialysis (HCC)   UTI (urinary tract infection)   Infected wound   Failure to thrive in adult   Pressure injury of sacral region, stage 1    Plan: Pt. agreeable to colonoscopy and upper endoscopy.  Given that patient is having dysphagia this would allow [place] to rule out any concerning lesions.  Dr. Billie Ruddy planning on placing rectal tube to help with colonoscopy prep given her buttock ulcer  We will place colonoscopy prep orders  I have discussed alternative options, risks & benefits,  which include, but are not limited to, bleeding, infection, perforation,respiratory complication & drug reaction.  The patient agrees with this plan & written consent will be obtained.      LOS: 8 days   Amy Antigua, MD 04/28/2020, 10:57 AM

## 2020-04-28 NOTE — Progress Notes (Signed)
Patient ID: [name], female   DOB: July 14, 1951, 69 y.o.   MRN: [id number]    This NP visited patient at the bedside/ as a follow up for palliative medicine needs and emotional support.  Medical records reviewed  Patient has a complex medical situation significant for AAA (status post repair in May 2019), heart failure, CAD (on Plavix), atrial fibrillation (not on anticoagulation), chronic renal insufficiency (now on permanent hemodialysis), peripheral vascular disease (status post bypasses), carotid artery disease (status post CEA and left subclavian stent), on home O2 at night only, ongoing tobacco abuse, hypertension, diabetes, hyperlipidemia, status post cholecystectomy, severe idiopathic pancreatitis, failure to thrive.  She continues to decline physically and functionally.  Poor po intake, continued weight loss, skin breakdown, chronic pain, overall failure to thrive.  According to the patient the plan is now for EGD/colonoscopy tomorrow PEG tube placement at the end of the week.  She agrees to disposition to SNF for rehab when medically stable. Recommend outpatient community-based palliative services  following discharge  We re-addressed her GOCs.  She ultimately hopes to return home, and regain her independence. Patient is open to all offered and available medical interventions to prolong life.  We had conversation around self responsibility in her treatment plan and the importance of her own motivation in  increasing her ability return home.  Education regarding active range of motion and simple bed level exercises.  Discussed the importance of her oral intake and the concept of grazing versus large meals.  I again offered education regarding concepts specific to human mortality and adult failure to thrive and the limitations of medical interventions to prolong quality of life when the body does fail to thrive.  Discussed with patient the importance of continued conversation with her  family and the  medical providers regarding overall plan of care and treatment options,  ensuring decisions are within the context of the patients values and GOCs.  Education offered on the importance of conversation and documentation of healthcare power of attorney and advanced directive.  Encouraged patient to consider DNR/DNI status understanding evidenced based poor outcomes in similar hospitalized patient, as the cause of arrest is likely associated with advanced chronic illness rather than an easily reversible acute cardio-pulmonary event. MOST form introduced.  Questions and concerns addressed   Discussed with Dr Billie Ruddy  Total time spent on the unit was 35 minutes  Greater than 50% of the time was spent in counseling and coordination of care  Wadie Lessen NP  Palliative Medicine Team Team Phone # [phone] Pager [phone]          Patient agrees that a SNF for rehabilitation on discharge is what she needs to do        I worry that Kyoko has limited insight into the seriousness of her current medical situation and the long term prognosis.     Education offered on concept specific to adult failure to thrive and limitations of medical intervetnions to prolong quality of life when a body does fail to thrive.  Education offered on the importance of her discussing and documenting  her ACP wishes. Again today addressed CODE STATUS and recommendation for DNR/DNI.   Encouraged patient to consider DNR/DNI status understanding evidenced based poor outcomes in similar hospitalized patient, as the cause of arrest is likely associated with advanced chronic illness rather than an easily reversible acute cardio-pulmonary event.  Discussed with patient the importance of continued conversation with her family and the  medical providers regarding overall  plan of care and treatment options,  ensuring decisions are within the context of the patients values and GOCs.  Questions and  concerns addressed     Total time spent on the unit was 35   minutes  Greater than 50% of the time was spent in counseling and coordination of care  Wadie Lessen NP  Palliative Medicine Team Team Phone # [phone] Pager [phone]

## 2020-04-28 NOTE — Progress Notes (Signed)
[name], [place] 04/28/20  Subjective:   LOS: 8  Patient resting in bed, in no acute distress Reports dysphagia and inability consume food  Objective:  Vital signs in last 24 hours:  Temp:  [97.9 F (36.6 C)-100.2 F (37.9 C)] 98.6 F (37 C) (08/23 0416) Pulse Rate:  [57-75] 59 (08/23 0856) Resp:  [18-20] 20 (08/23 0416) BP: (114-135)/(45-72) 116/45 (08/23 0856) SpO2:  [93 %-100 %] 93 % (08/23 0416)  Weight change:  Filed Weights   04/19/20 1218 04/20/20 2033  Weight: 37.6 kg 36.7 kg    Intake/Output:    Intake/Output Summary (Last 24 hours) at 04/28/2020 1127 Last data filed at 04/28/2020 1003 Gross per 24 hour  Intake 1326.58 ml  Output --  Net 1326.58 ml    Physical Exam: General:   No acute distress, lying in bed  HEENT   Mucous membrane moist, Sclerae and conjunctivae clear  Pulm/lungs  Respirations even,unlabored. Lungs-Clear b/l  CVS/Heart  S1S2, Irregular,no rub or gallop  Abdomen:   Soft, nontender  Extremities:  No peripheral edema   Neurologic:  Awake, alert  Skin:  No acute rashes, or lesions  Left arm AV fistula    Basic Metabolic Panel:  Recent Labs  Lab 04/24/20 0416 04/24/20 0416 04/25/20 0502 04/25/20 0502 04/26/20 0522 04/27/20 0531 04/27/20 2143 04/28/20 0338  NA 141  --  137  --  138 142  --  136  K 3.6   < > 4.1  --  4.1 3.0* 4.8 5.0  CL 105  --  104  --  101 107  --  102  CO2 26  --  27  --  26 27  --  25  GLUCOSE 92  --  108*  --  102* 637*  --  125*  BUN 19  --  9  --  14 10  --  12  CREATININE 2.63*  --  1.77*  --  2.42* 1.82*  --  2.09*  CALCIUM 7.9*   < > 7.8*   < > 8.1* 6.7*  --  7.7*  MG 2.1  --  1.8  --  2.7* 1.8  --  2.1  PHOS 2.9   < > 1.4*  --  4.3 2.4* 4.9* 5.2*   < > = values in this interval not displayed.     CBC: Recent Labs  Lab 04/22/20 0443 04/22/20 0443 04/23/20 0455 04/23/20 0455 04/24/20 0416 04/25/20 0502 04/26/20 0522 04/27/20 0531 04/28/20 0338  WBC 7.1    < > 5.4   < > 5.7 6.9 8.6 6.7 7.6  NEUTROABS 5.8  --  4.2  --   --   --   --   --   --   HGB 9.2*   < > 9.4*   < > 8.8* 10.2* 11.3* 8.9* 8.7*  HCT 26.7*   < > 27.6*   < > 26.3* 29.4* 34.6* 27.1* 26.2*  MCV 102.7*   < > 103.8*   < > 103.5* 101.7* 106.5* 106.3* 105.2*  PLT 122*   < > 120*   < > 127* 132* 210 133* 147*   < > = values in this interval not displayed.      Lab Results  Component Value Date   HEPBSAG NON REACTIVE 04/22/2020   HEPBSAB Non Reactive 07/27/2018      Microbiology:  Recent Results (from the past 240 hour(s))  Urine culture     Status: Abnormal  Collection Time: 04/19/20 12:06 PM   Specimen: Urine, Random  Result Value Ref Range Status   Specimen Description   Final    URINE, RANDOM Performed at [facility], [address] [place], [place] [address]    Special Requests   Final    NONE Performed at [facility], [place], [address] [address]    Culture (A)  Final    >=100,000 COLONIES/mL ESCHERICHIA COLI >=100,000 COLONIES/mL ENTEROCOCCUS FAECALIS    Report Status 04/22/2020 FINAL  Final   Organism ID, Bacteria ESCHERICHIA COLI (A)  Final   Organism ID, Bacteria ENTEROCOCCUS FAECALIS (A)  Final      Susceptibility   Escherichia coli - MIC*    AMPICILLIN >=32 RESISTANT Resistant     CEFAZOLIN <=4 SENSITIVE Sensitive     CEFTRIAXONE <=0.25 SENSITIVE Sensitive     CIPROFLOXACIN <=0.25 SENSITIVE Sensitive     GENTAMICIN <=1 SENSITIVE Sensitive     IMIPENEM <=0.25 SENSITIVE Sensitive     NITROFURANTOIN <=16 SENSITIVE Sensitive     TRIMETH/SULFA <=20 SENSITIVE Sensitive     AMPICILLIN/SULBACTAM >=32 RESISTANT Resistant     PIP/TAZO <=4 SENSITIVE Sensitive     * >=100,000 COLONIES/mL ESCHERICHIA COLI   Enterococcus faecalis - MIC*    AMPICILLIN <=2 SENSITIVE Sensitive     NITROFURANTOIN <=16 SENSITIVE Sensitive     VANCOMYCIN 2 SENSITIVE Sensitive     * >=100,000 COLONIES/mL ENTEROCOCCUS FAECALIS  SARS  Coronavirus 2 by RT PCR (hospital order, performed in [place] hospital lab) Nasopharyngeal Nasopharyngeal Swab     Status: None   Collection Time: 04/20/20  1:46 AM   Specimen: Nasopharyngeal Swab  Result Value Ref Range Status   SARS Coronavirus 2 NEGATIVE NEGATIVE Final    Comment: (NOTE) SARS-CoV-2 target nucleic acids are NOT DETECTED.  The SARS-CoV-2 RNA is generally detectable in upper and lower respiratory specimens during the acute phase of infection. The lowest concentration of SARS-CoV-2 viral copies this assay can detect is 250 copies / mL. A negative result does not preclude SARS-CoV-2 infection and should not be used as the sole basis for treatment or other patient management decisions.  A negative result may occur with improper specimen collection / handling, submission of specimen other than nasopharyngeal swab, presence of viral mutation(s) within the areas targeted by this assay, and inadequate number of viral copies (<250 copies / mL). A negative result must be combined with clinical observations, patient history, and epidemiological information.  Fact Sheet for Patients:   StrictlyIdeas.no  Fact Sheet for Healthcare Providers: BankingDealers.co.za  This test is not yet approved or  cleared by the [place] FDA and has been authorized for detection and/or diagnosis of SARS-CoV-2 by FDA under an Emergency Use Authorization (EUA).  This EUA will remain in effect (meaning this test can be used) for the duration of the COVID-19 declaration under Section 564(b)(1) of the Act, 21 U.S.C. section 360bbb-3(b)(1), unless the authorization is terminated or revoked sooner.  Performed at [facility], [address] [place], [place] [address]     Coagulation Studies: No results for input(s): LABPROT, INR in the last 72 hours.  Urinalysis: No results for input(s): COLORURINE, LABSPEC, PHURINE, GLUCOSEU,  HGBUR, BILIRUBINUR, KETONESUR, PROTEINUR, UROBILINOGEN, NITRITE, LEUKOCYTESUR in the last 72 hours.  Invalid input(s): APPERANCEUR    Imaging: No results found.   Medications:   . dextrose 5 % and 0.9% NaCl Stopped (04/27/20 1738)   . (feeding supplement) PROSource Plus  30  mL Oral TID BM  . allopurinol  100 mg Oral QHS  . amitriptyline  10 mg Oral QHS  . vitamin C  500 mg Oral BID  . atorvastatin  40 mg Oral QHS  . bisacodyl  10 mg Oral Once  . Chlorhexidine Gluconate Cloth  6 each Topical Q0600  . collagenase   Topical Daily  . epoetin (EPOGEN/PROCRIT) injection  4,000 Units Intravenous Q T,Th,Sa-HD  . feeding supplement  1 Container Oral TID BM  . gabapentin  400 mg Oral TID  . heparin  5,000 Units Subcutaneous Q8H  . lipase/protease/amylase  24,000 Units Oral TID  . metoprolol tartrate  12.5 mg Oral BID  . multivitamin  1 tablet Oral QHS  . multivitamin-lutein  1 capsule Oral Daily  . nicotine  21 mg Transdermal Daily  . pantoprazole  40 mg Oral Daily  . polyethylene glycol-electrolytes  4,000 mL Oral Once  . senna-docusate  1 tablet Oral BID  . sertraline  50 mg Oral Daily  . traZODone  150 mg Oral QHS   acetaminophen **OR** acetaminophen, albuterol, alum & mag hydroxide-simeth, diphenhydrAMINE, HYDROmorphone, midodrine, nitroGLYCERIN, ondansetron **OR** ondansetron (ZOFRAN) IV, promethazine  Assessment/ Plan:  69 y.o. female with medical problems of ESRD, on hemodialysis, CAD/CABG 03/2011, COPD, atrial fibrillation, hypertension, COPD, chronic pancreatitis and chronic abdominal pain requiring recurrent hospitalizations , h/o AAA, cholecystectomy, severe peripheral arterial disease including bilateral renal artery stent, subclavian artery stenosis, left, left CEA  was admitted on 04/19/2020 for  Principal Problem:   Inadequate oral intake Active Problems:   Essential hypertension   Atrial fibrillation (HCC)   Chronic diastolic heart failure (HCC)   COPD with chronic  bronchitis (HCC)   S/P CABG x 3   Diabetes mellitus (HCC)   Chronic kidney disease (CKD), stage IV (severe) (HCC)   Weakness   Severe protein-calorie malnutrition (HCC)   Pressure injury, stage 4 (HCC)   ESRD on hemodialysis ([place])   UTI (urinary tract infection)   Infected wound   Failure to thrive in adult   Pressure injury of sacral region, stage 1  Infected wound [T14.8XXA, L08.9] Weakness [R53.1] Failure to thrive in adult [R62.7] Inadequate oral intake [R63.8] Acute cystitis with hematuria [N30.01] Wound of right buttock, initial encounter [S31.819A] Pressure injury of sacral region, stage 1 [facility]/TTS/left arm AV fistula/CK [place]  #. ESRD Patient received dialysis on Saturday Will continue TTS schedule  Lab Results  Component Value Date   CREATININE 2.09 (H) 04/28/2020   CREATININE 1.82 (H) 04/27/2020   CREATININE 2.42 (H) 04/26/2020     #. Anemia of CKD  Lab Results  Component Value Date   HGB 8.7 (L) 04/28/2020   Will continue low dose EPO with HD  #. Secondary hyperparathyroidism of renal origin N 25.81      Component Value Date/Time   PTH 80 (H) 08/15/2018 1435   Lab Results  Component Value Date   PHOS 5.2 (H) 04/28/2020   Phosphorus at target at 5.2.   #.  Rt Buttock wound Eval by wound care nurse S/p I & D rt ischial decub ulcer Concern about severe protein calorie malnutrition Getting oral protein supplements Planning for endoscopy by GI  #Urinary tract infection E. coli and Enterococcus faecalis Treated with IV Unasyn- completed  #Malnutrition Patient reports pain dysphagia.  She also reports anorexia.  GI team planning to do endoscopy and colonoscopy after Plavix wash out.    LOS: 8 [name] 8/23/202111:27 AM  [facility]  [place], [place]

## 2020-04-29 ENCOUNTER — Encounter: Payer: Self-pay | Admitting: Anesthesiology

## 2020-04-29 ENCOUNTER — Encounter: Payer: Self-pay | Admitting: Internal Medicine

## 2020-04-29 ENCOUNTER — Inpatient Hospital Stay: Payer: Medicare Other

## 2020-04-29 LAB — CBC
HCT: 31.7 % — ABNORMAL LOW (ref 36.0–46.0)
Hemoglobin: 11.2 g/dL — ABNORMAL LOW (ref 12.0–15.0)
MCH: 35.8 pg — ABNORMAL HIGH (ref 26.0–34.0)
MCHC: 35.3 g/dL (ref 30.0–36.0)
MCV: 101.3 fL — ABNORMAL HIGH (ref 80.0–100.0)
Platelets: 192 10*3/uL (ref 150–400)
RBC: 3.13 MIL/uL — ABNORMAL LOW (ref 3.87–5.11)
RDW: 16 % — ABNORMAL HIGH (ref 11.5–15.5)
WBC: 13.3 10*3/uL — ABNORMAL HIGH (ref 4.0–10.5)
nRBC: 0 % (ref 0.0–0.2)

## 2020-04-29 LAB — GLUCOSE, CAPILLARY
Glucose-Capillary: 93 mg/dL (ref 70–99)
Glucose-Capillary: 80 mg/dL (ref 70–99)
Glucose-Capillary: 84 mg/dL (ref 70–99)

## 2020-04-29 LAB — BASIC METABOLIC PANEL
Anion gap: 11 (ref 5–15)
BUN: 14 mg/dL (ref 8–23)
CO2: 26 mmol/L (ref 22–32)
Calcium: 7.9 mg/dL — ABNORMAL LOW (ref 8.9–10.3)
Chloride: 99 mmol/L (ref 98–111)
Creatinine, Ser: 2.19 mg/dL — ABNORMAL HIGH (ref 0.44–1.00)
GFR calc Af Amer: 26 mL/min — ABNORMAL LOW (ref >60)
GFR calc non Af Amer: 23 mL/min — ABNORMAL LOW (ref >60)
Glucose, Bld: 134 mg/dL — ABNORMAL HIGH (ref 70–99)
Potassium: 4.5 mmol/L (ref 3.5–5.1)
Sodium: 136 mmol/L (ref 135–145)

## 2020-04-29 LAB — MAGNESIUM: Magnesium: 2 mg/dL (ref 1.7–2.4)

## 2020-04-29 LAB — PHOSPHORUS: Phosphorus: 4.8 mg/dL — ABNORMAL HIGH (ref 2.5–4.6)

## 2020-04-29 MED ORDER — LORAZEPAM 2 MG/ML IJ SOLN: 1.0000 mg | Freq: Once | INTRAMUSCULAR | Status: DC

## 2020-04-29 MED ORDER — HEPARIN SODIUM (PORCINE) 1000 UNIT/ML DIALYSIS: 20.0000 [IU]/kg | INTRAMUSCULAR | Status: DC | PRN

## 2020-04-29 MED ORDER — COLLAGENASE 250 UNIT/GM EX OINT
TOPICAL_OINTMENT | Freq: Every day | CUTANEOUS | Status: DC
  Filled 2020-04-29: qty 30

## 2020-04-29 MED ORDER — PROPOFOL 500 MG/50ML IV EMUL
INTRAVENOUS | Status: AC
  Filled 2020-04-29: qty 50

## 2020-04-29 MED ORDER — LORAZEPAM 2 MG PO TABS
2.0000 mg | ORAL_TABLET | Freq: Once | ORAL | Status: AC
  Administered 2020-04-30: 2 mg via ORAL
  Filled 2020-04-29: qty 1

## 2020-04-29 MED ORDER — PEG 3350-KCL-NA BICARB-NACL 420 G PO SOLR
2000.0000 mL | Freq: Once | ORAL | Status: DC
  Filled 2020-04-29: qty 4000

## 2020-04-29 NOTE — Progress Notes (Signed)
[place], [place] 04/29/20  Subjective:   LOS: 9  Seen patient in dialysis today. Patient appears very lethargic, attempted to open eyes to call. She has a new NGT in place due to dysphagia and poor oral intake.   Objective:  Vital signs in last 24 hours:  Temp:  [97.8 F (36.6 C)-99.9 F (37.7 C)] 98.2 F (36.8 C) (08/24 0652) Pulse Rate:  [66-109] 66 (08/24 0652) Resp:  [16-24] 16 (08/24 0652) BP: (106-141)/(50-77) 106/50 (08/24 0652) SpO2:  [96 %-100 %] 96 % (08/24 0652)  Weight change:  Filed Weights   04/19/20 1218 04/20/20 2033  Weight: 37.6 kg 36.7 kg    Intake/Output:    Intake/Output Summary (Last 24 hours) at 04/29/2020 1224 Last data filed at 04/29/2020 0746 Gross per 24 hour  Intake 4000 ml  Output 0 ml  Net 4000 ml    Physical Exam: General:   Lethargic  HEENT  NGT in place, mucous membranes moist  Pulm/lungs  Lungs clear  CVS/Heart  Irregular rhythm,no rub or gallop  Abdomen:   Soft, nontender  Extremities:  No peripheral edema   Neurologic:  Attempts to open eyes to call,lethargic  Skin:  Dry,warm, has chronic decubitus ulcers  Left arm AV fistula    Basic Metabolic Panel:  Recent Labs  Lab 04/25/20 0502 04/25/20 0502 04/26/20 0522 04/26/20 0522 04/27/20 0531 04/27/20 2143 04/28/20 0338 04/29/20 0418  NA 137  --  138  --  142  --  136 136  K 4.1   < > 4.1  --  3.0* 4.8 5.0 4.5  CL 104  --  101  --  107  --  102 99  CO2 27  --  26  --  27  --  25 26  GLUCOSE 108*  --  102*  --  637*  --  125* 134*  BUN 9  --  14  --  10  --  12 14  CREATININE 1.77*  --  2.42*  --  1.82*  --  2.09* 2.19*  CALCIUM 7.8*   < > 8.1*   < > 6.7*  --  7.7* 7.9*  MG 1.8  --  2.7*  --  1.8  --  2.1 2.0  PHOS 1.4*   < > 4.3  --  2.4* 4.9* 5.2* 4.8*   < > = values in this interval not displayed.     CBC: Recent Labs  Lab 04/23/20 0455 04/24/20 0416 04/25/20 0502 04/26/20 0522 04/27/20 0531 04/28/20 0338 04/29/20 0418   WBC 5.4   < > 6.9 8.6 6.7 7.6 13.3*  NEUTROABS 4.2  --   --   --   --   --   --   HGB 9.4*   < > 10.2* 11.3* 8.9* 8.7* 11.2*  HCT 27.6*   < > 29.4* 34.6* 27.1* 26.2* 31.7*  MCV 103.8*   < > 101.7* 106.5* 106.3* 105.2* 101.3*  PLT 120*   < > 132* 210 133* 147* 192   < > = values in this interval not displayed.      Lab Results  Component Value Date   HEPBSAG NON REACTIVE 04/22/2020   HEPBSAB Non Reactive 07/27/2018      Microbiology:  Recent Results (from the past 240 hour(s))  SARS Coronavirus 2 by RT PCR (hospital order, performed in [facility] hospital lab) Nasopharyngeal Nasopharyngeal Swab     Status: None   Collection Time: 04/20/20  1:46 AM  Specimen: Nasopharyngeal Swab  Result Value Ref Range Status   SARS Coronavirus 2 NEGATIVE NEGATIVE Final    Comment: (NOTE) SARS-CoV-2 target nucleic acids are NOT DETECTED.  The SARS-CoV-2 RNA is generally detectable in upper and lower respiratory specimens during the acute phase of infection. The lowest concentration of SARS-CoV-2 viral copies this assay can detect is 250 copies / mL. A negative result does not preclude SARS-CoV-2 infection and should not be used as the sole basis for treatment or other patient management decisions.  A negative result may occur with improper specimen collection / handling, submission of specimen other than nasopharyngeal swab, presence of viral mutation(s) within the areas targeted by this assay, and inadequate number of viral copies (<250 copies / mL). A negative result must be combined with clinical observations, patient history, and epidemiological information.  Fact Sheet for Patients:   StrictlyIdeas.no  Fact Sheet for Healthcare Providers: BankingDealers.co.za  This test is not yet approved or  cleared by the [place] FDA and has been authorized for detection and/or diagnosis of SARS-CoV-2 by FDA under an Emergency Use  Authorization (EUA).  This EUA will remain in effect (meaning this test can be used) for the duration of the COVID-19 declaration under Section 564(b)(1) of the Act, 21 U.S.C. section 360bbb-3(b)(1), unless the authorization is terminated or revoked sooner.  Performed at [facility], [address] [place], [place] [address]     Coagulation Studies: No results for input(s): LABPROT, INR in the last 72 hours.  Urinalysis: No results for input(s): COLORURINE, LABSPEC, PHURINE, GLUCOSEU, HGBUR, BILIRUBINUR, KETONESUR, PROTEINUR, UROBILINOGEN, NITRITE, LEUKOCYTESUR in the last 72 hours.  Invalid input(s): APPERANCEUR    Imaging: DG Abd 1 View  Result Date: 04/29/2020 CLINICAL DATA:  Nasogastric tube placement EXAM: ABDOMEN - 1 VIEW COMPARISON:  11:14 p.m. FINDINGS: Nasogastric tube extends into the left upper quadrant the abdomen with the proximal side hole within the proximal body of the stomach. Abdomen largely excluded from view. IMPRESSION: Nasogastric tube tip in the proximal body of the stomach. Electronically Signed   By: Fidela Salisbury MD   On: 04/29/2020 01:38   DG Abd 1 View  Result Date: 04/28/2020 CLINICAL DATA:  NG tube placement EXAM: ABDOMEN - 1 VIEW COMPARISON:  04/23/2020, CT 10/21/2019 FINDINGS: Lower sternotomy and atrial appendage clip. Valve prosthesis. Aorto iliac stent and bilateral renal stents. Esophageal tube tip and side port overlie the gastric body region. IMPRESSION: Esophageal tube tip and side port overlie the gastric body region. Electronically Signed   By: Donavan Foil M.D.   On: 04/28/2020 23:24     Medications:   . dextrose 5 % and 0.9% NaCl 50 mL/hr at 04/29/20 0647   . (feeding supplement) PROSource Plus  30 mL Oral TID BM  . allopurinol  100 mg Oral QHS  . amitriptyline  10 mg Oral QHS  . vitamin C  500 mg Oral BID  . atorvastatin  40 mg Oral QHS  . Chlorhexidine Gluconate Cloth  6 each Topical Q0600  . collagenase   Topical  Daily  . epoetin (EPOGEN/PROCRIT) injection  4,000 Units Intravenous Q T,Th,Sa-HD  . feeding supplement  1 Container Oral TID BM  . gabapentin  400 mg Oral TID  . heparin  5,000 Units Subcutaneous Q8H  . lipase/protease/amylase  24,000 Units Oral TID  . metoprolol tartrate  12.5 mg Oral BID  . multivitamin  1 tablet Oral QHS  . multivitamin-lutein  1 capsule Oral Daily  . nicotine  21 mg Transdermal Daily  . pantoprazole  40 mg Oral Daily  . senna-docusate  1 tablet Oral BID  . sertraline  50 mg Oral Daily  . traZODone  150 mg Oral QHS   acetaminophen **OR** acetaminophen, albuterol, alum & mag hydroxide-simeth, diphenhydrAMINE, heparin, HYDROmorphone, midodrine, nitroGLYCERIN, ondansetron **OR** ondansetron (ZOFRAN) IV, promethazine  Assessment/ Plan:  69 y.o. female with medical problems of ESRD, on hemodialysis, CAD/CABG 03/2011, COPD, atrial fibrillation, hypertension, COPD, chronic pancreatitis and chronic abdominal pain requiring recurrent hospitalizations , h/o AAA, cholecystectomy, severe peripheral arterial disease including bilateral renal artery stent, subclavian artery stenosis, left, left CEA  was admitted on 04/19/2020 for generalized weakness and pain.  Principal Problem:   Inadequate oral intake Active Problems:   Essential hypertension   Atrial fibrillation (HCC)   Chronic diastolic heart failure (HCC)   COPD with chronic bronchitis (HCC)   S/P CABG x 3   Diabetes mellitus (HCC)   Chronic kidney disease (CKD), stage IV (severe) (HCC)   Weakness   Severe protein-calorie malnutrition (HCC)   Pressure injury, stage 4 (HCC)   ESRD on hemodialysis ([place])   UTI (urinary tract infection)   Infected wound   Failure to thrive in adult   Pressure injury of sacral region, stage 1  Infected wound [T14.8XXA, L08.9] Weakness [R53.1] Failure to thrive in adult [R62.7] Inadequate oral intake [R63.8] Acute cystitis with hematuria [N30.01] Wound of right buttock, initial  encounter [S31.819A] Pressure injury of sacral region, stage 1 [facility]/TTS/left arm AV fistula/CK [place]  #. ESRD Patient received dialysis on Saturday Will continue TTS schedule  Lab Results  Component Value Date   CREATININE 2.19 (H) 04/29/2020   CREATININE 2.09 (H) 04/28/2020   CREATININE 1.82 (H) 04/27/2020     #. Anemia of CKD  Lab Results  Component Value Date   HGB 11.2 (L) 04/29/2020   Will continue low dose EPO with HD  #. Secondary hyperparathyroidism of renal origin N 25.81      Component Value Date/Time   PTH 80 (H) 08/15/2018 1435   Lab Results  Component Value Date   PHOS 4.8 (H) 04/29/2020   Phosphorus at target at 5.2.   #.  Rt Buttock wound S/p I & D rt ischial decub ulcer,managing by the wound care team May be contributed by severe protein calorie malnutrition Plan to place a G tube, currently has an NGT in place  #Urinary tract infection E. coli and Enterococcus faecalis, treated with antibiotics.   #Malnutrition Patient has anorexia and dysphagia.  Currently has an NGT in place for feeding. GI team planning to do endoscopy and colonoscopy after Plavix wash out.    LOS: 9 [name] 8/24/202112:24 PM  [facility] [place], [place]

## 2020-04-29 NOTE — Progress Notes (Signed)
During report from day shift nurse, Seth Bake, I was informed that patient's arm became infiltrated while in dialysis and her iv pump was beeping. Ice was applied to arm and another IV was placed. Later on night shift, that IV came out and IV team were unsuccessful in placing another one. She will need a PICC line because her arm is weeping.  Will continue to monitor.  Christene Slates

## 2020-04-29 NOTE — Progress Notes (Signed)
PROGRESS NOTE    [name]  QQP:[id number] DOB: May 12, 1951 DOA: 04/19/2020 PCP: Bernerd Limbo, MD   Brief Narrative:  Briefly this is a 69 year old Caucasian female with extensive past medical history of ESRD on hemodialysis TTS, CAD status post CABG, status post mitral valve repair, atrial fibrillation on anticoagulation, chronic diastolic congestive heart failure, PAD, bilateral renal artery stenosis, severe protein calorie malnutrition, chronic pancreatitis, chronic narcotic use, COPD, hypertension, depression.  She presented to the emergency department yesterday with weakness, generalized pain, acute on chronic pain of the right buttock ulcer.  The patient's been followed by wound care clinic.  However the wound had worsened and pain worsened to the point that the patient presented to the emergency department.  She is also markedly malnourished weight has dropped to 37.6 kg.  BMI 13.8.   Assessment & Plan:   Principal Problem:   Inadequate oral intake Active Problems:   Essential hypertension   Atrial fibrillation (HCC)   Chronic diastolic heart failure (HCC)   COPD with chronic bronchitis (HCC)   S/P CABG x 3   Diabetes mellitus (HCC)   Chronic kidney disease (CKD), stage IV (severe) (HCC)   Weakness   Severe protein-calorie malnutrition (HCC)   Pressure injury, stage 4 (HCC)   ESRD on hemodialysis (HCC)   UTI (urinary tract infection)   Infected wound   Failure to thrive in adult   Pressure injury of sacral region, stage 1  # Infected right buttock wound, pressure ulcer, unstageable, full thickness tissue loss, POA # Right ankle wound, pressure ulcer, stage 2, POA Patient presented with acute on chronic pain These wounds have been present for a long time, she is followed by the wound care clinic Recently had increased pain and increased foul-smelling drainage On admission had elevated procalcitonin was given Rocephin --completed 5 days of abx with Unasyn --currently  stable, afebrile, not leukocytosis, not septic. Plan: --continue home oral dilaudid 4 mg q4h PRN (do not increase) --continue gabapentin 400 mg TID --encourage sitting up and change position q2h --Re-consult wound care nurse today to review wound care and dressing changes --will need better nutrition for wound healing  Poor p.o. intake Severe protein calorie malnutrition Adult failure to thrive Patient has had a 2-year history of weight loss due to reduced oral intake. Endorses poor appetite's Was being worked up outpatient for a colonoscopy and potential PEG tube evaluation --albumin 1.6 --Plavix held since 8/20 --GI consulted for consideration of G-tube placement --Marinol and Remeron d/c'ed since no effect Plan: --colonoscopy and EGD tomorrow --continue D5 MIVF@50   Hypoglycemia 2/2 poor oral intake --ACHS BG checks to monitor for hypoglycemia --continue D5 MIVF@50   ESRD on hemodialysis iHD per nephrology  Urinary tract infection Unclear whether this represents a true infection.   --Pt completed 5 days of IV abx.  Hx of Chronic hypotension, not currently active  Atrial fibrillation Currently rate controlled Not on anticoagulants --continue metop  Chronic diastolic congestive heart failure Currently euvolemic --avoid boluses  COPD with chronic bronchitis --respiratory status table.  On albuterol inhaler PRN at home.  CAD status post CABG x3 No acute issues  Diabetes mellitus, not currently active --A1c has been normal for the past year --No need for SSI  Chronic pain on chronic narcotics  Chronic pancreatitis --IV dilaudid d/c'ed PLAN: --continue Creon with meals --continue home oral dilaudid 4 mg q4h PRN (do not increase) --continue gabapentin 400 mg TID --continue amitriptyline (new)  Hypophos HypoKalemia --recheck and replete PRN  Anemia of chronic  disease --anemia workup showed only borderline low iron --continue Epo with dialysis   DVT  prophylaxis: Heparin SQ Code Status: Full code  Family Communication:  Status is: inpatient Dispo:   The patient is from: home Anticipated d/c is to: SNF Anticipated d/c date is: 4-5 days  Patient currently is not medically stable to d/c due to: Need G-tube placement for reliable nutrition and wound healing, waiting on Plavix washout and then EGD and colonoscopy as prerequisites    Consultants:   Nephrology  General surgery  Procedures:   Bedside wound debridement, 04/20/20  Antimicrobials:   Unasyn, 04/20/2020-   Subjective: Pt was able to complete bowel prep via NG tube.  EGD and colonoscopy had to be postponed until tomorrow due to having had dialysis this morning.    Objective: Vitals:   04/28/20 2004 04/28/20 2201 04/29/20 0652 04/29/20 1435  BP: (!) 141/77 (!) 126/56 (!) 106/50 (!) 156/61  Pulse: (!) 109 78 66 78  Resp: (!) 24 16 16    Temp: 97.8 F (36.6 C) 99.9 F (37.7 C) 98.2 F (36.8 C) 99.5 F (37.5 C)  TempSrc: Oral Oral Oral Axillary  SpO2: 100% 96% 96% 97%  Weight:      Height:        Intake/Output Summary (Last 24 hours) at 04/29/2020 1456 Last data filed at 04/29/2020 0746 Gross per 24 hour  Intake 4000 ml  Output 0 ml  Net 4000 ml   Filed Weights   04/19/20 1218 04/20/20 2033  Weight: 37.6 kg 36.7 kg    Examination:  Constitutional: NAD, sleeping during dialysis CV: No cyanosis.   RESP: normal respiratory effort  GI: +BS, leaking stool SKIN: warm, dry    Data Reviewed: I have personally reviewed following labs and imaging studies  CBC: Recent Labs  Lab 04/23/20 0455 04/24/20 0416 04/25/20 0502 04/26/20 0522 04/27/20 0531 04/28/20 0338 04/29/20 0418  WBC 5.4   < > 6.9 8.6 6.7 7.6 13.3*  NEUTROABS 4.2  --   --   --   --   --   --   HGB 9.4*   < > 10.2* 11.3* 8.9* 8.7* 11.2*  HCT 27.6*   < > 29.4* 34.6* 27.1* 26.2* 31.7*  MCV 103.8*   < > 101.7* 106.5* 106.3* 105.2* 101.3*  PLT 120*   < > 132* 210 133* 147* 192   < > =  values in this interval not displayed.   Basic Metabolic Panel: Recent Labs  Lab 04/25/20 0502 04/25/20 0502 04/26/20 0522 04/27/20 0531 04/27/20 2143 04/28/20 0338 04/29/20 0418  NA 137  --  138 142  --  136 136  K 4.1   < > 4.1 3.0* 4.8 5.0 4.5  CL 104  --  101 107  --  102 99  CO2 27  --  26 27  --  25 26  GLUCOSE 108*  --  102* 637*  --  125* 134*  BUN 9  --  14 10  --  12 14  CREATININE 1.77*  --  2.42* 1.82*  --  2.09* 2.19*  CALCIUM 7.8*  --  8.1* 6.7*  --  7.7* 7.9*  MG 1.8  --  2.7* 1.8  --  2.1 2.0  PHOS 1.4*   < > 4.3 2.4* 4.9* 5.2* 4.8*   < > = values in this interval not displayed.   GFR: Estimated Creatinine Clearance: 14.4 mL/min (A) (by C-G formula based on SCr of 2.19 mg/dL (H)). Liver  Function Tests: Recent Labs  Lab 04/24/20 0416  ALBUMIN 1.6*   No results for input(s): LIPASE, AMYLASE in the last 168 hours. No results for input(s): AMMONIA in the last 168 hours. Coagulation Profile: No results for input(s): INR, PROTIME in the last 168 hours. Cardiac Enzymes: No results for input(s): CKTOTAL, CKMB, CKMBINDEX, TROPONINI in the last 168 hours. BNP (last 3 results) No results for input(s): PROBNP in the last 8760 hours. HbA1C: No results for input(s): HGBA1C in the last 72 hours. CBG: Recent Labs  Lab 04/28/20 0806 04/28/20 1155 04/28/20 1622 04/28/20 2043 04/29/20 0736  GLUCAP 91 102* 118* 94 93   Lipid Profile: No results for input(s): CHOL, HDL, LDLCALC, TRIG, CHOLHDL, LDLDIRECT in the last 72 hours. Thyroid Function Tests: No results for input(s): TSH, T4TOTAL, FREET4, T3FREE, THYROIDAB in the last 72 hours. Anemia Panel: Recent Labs    04/28/20 0338  VITAMINB12 559  FOLATE 50.0  TIBC NOT CALCULATED  IRON 35   Sepsis Labs: No results for input(s): PROCALCITON, LATICACIDVEN in the last 168 hours.  Recent Results (from the past 240 hour(s))  SARS Coronavirus 2 by RT PCR (hospital order, performed in [facility] hospital lab)  Nasopharyngeal Nasopharyngeal Swab     Status: None   Collection Time: 04/20/20  1:46 AM   Specimen: Nasopharyngeal Swab  Result Value Ref Range Status   SARS Coronavirus 2 NEGATIVE NEGATIVE Final    Comment: (NOTE) SARS-CoV-2 target nucleic acids are NOT DETECTED.  The SARS-CoV-2 RNA is generally detectable in upper and lower respiratory specimens during the acute phase of infection. The lowest concentration of SARS-CoV-2 viral copies this assay can detect is 250 copies / mL. A negative result does not preclude SARS-CoV-2 infection and should not be used as the sole basis for treatment or other patient management decisions.  A negative result may occur with improper specimen collection / handling, submission of specimen other than nasopharyngeal swab, presence of viral mutation(s) within the areas targeted by this assay, and inadequate number of viral copies (<250 copies / mL). A negative result must be combined with clinical observations, patient history, and epidemiological information.  Fact Sheet for Patients:   StrictlyIdeas.no  Fact Sheet for Healthcare Providers: BankingDealers.co.za  This test is not yet approved or  cleared by the [place] FDA and has been authorized for detection and/or diagnosis of SARS-CoV-2 by FDA under an Emergency Use Authorization (EUA).  This EUA will remain in effect (meaning this test can be used) for the duration of the COVID-19 declaration under Section 564(b)(1) of the Act, 21 U.S.C. section 360bbb-3(b)(1), unless the authorization is terminated or revoked sooner.  Performed at [facility], [address] [place], [place] [address]          Radiology Studies: DG Abd 1 View  Result Date: 04/29/2020 CLINICAL DATA:  Nasogastric tube placement EXAM: ABDOMEN - 1 VIEW COMPARISON:  11:14 p.m. FINDINGS: Nasogastric tube extends into the left upper quadrant the abdomen with the  proximal side hole within the proximal body of the stomach. Abdomen largely excluded from view. IMPRESSION: Nasogastric tube tip in the proximal body of the stomach. Electronically Signed   By: Fidela Salisbury MD   On: 04/29/2020 01:38   DG Abd 1 View  Result Date: 04/28/2020 CLINICAL DATA:  NG tube placement EXAM: ABDOMEN - 1 VIEW COMPARISON:  04/23/2020, CT 10/21/2019 FINDINGS: Lower sternotomy and atrial appendage clip. Valve prosthesis. Aorto iliac stent and bilateral renal stents. Esophageal tube tip and  side port overlie the gastric body region. IMPRESSION: Esophageal tube tip and side port overlie the gastric body region. Electronically Signed   By: Donavan Foil M.D.   On: 04/28/2020 23:24        Scheduled Meds: . (feeding supplement) PROSource Plus  30 mL Oral TID BM  . allopurinol  100 mg Oral QHS  . amitriptyline  10 mg Oral QHS  . vitamin C  500 mg Oral BID  . atorvastatin  40 mg Oral QHS  . Chlorhexidine Gluconate Cloth  6 each Topical Q0600  . collagenase   Topical Daily  . epoetin (EPOGEN/PROCRIT) injection  4,000 Units Intravenous Q T,Th,Sa-HD  . feeding supplement  1 Container Oral TID BM  . gabapentin  400 mg Oral TID  . heparin  5,000 Units Subcutaneous Q8H  . lipase/protease/amylase  24,000 Units Oral TID  . metoprolol tartrate  12.5 mg Oral BID  . multivitamin  1 tablet Oral QHS  . multivitamin-lutein  1 capsule Oral Daily  . nicotine  21 mg Transdermal Daily  . pantoprazole  40 mg Oral Daily  . polyethylene glycol-electrolytes  2,000 mL Oral Once  . senna-docusate  1 tablet Oral BID  . sertraline  50 mg Oral Daily  . traZODone  150 mg Oral QHS   Continuous Infusions: . dextrose 5 % and 0.9% NaCl 50 mL/hr at 04/29/20 9937     LOS: 9 days     Enzo Bi, MD [facility] Pager [phone]  If 7PM-7AM, please contact night-coverage 04/29/2020, 2:56 PM

## 2020-04-29 NOTE — Consult Note (Addendum)
WOC Nurse re-consult Note: Reason for Consult: WOC consult performed on 8/15.  Surgical consult also performed on 8/15 and they performed sharp debridement at that time and ordered Santyl.   WOC requested to re-assess wounds at that time.  Wound type: Right outer ankle unstageable pressure injury; 1X1cm, 100% dark purple-black eschar, no odor, drainage, or fluctuance Right buttock with unstageable pressure injury; 4X4.5X.3cm, 100% tightly adhered tan slough, no odor or fluctuance, small amt tan drainage.  Pressure Injury POA: Yes Dressing procedure/placement/frequency: Topical treatment orders provided for bedside nurses to perform daily; continue present plan of care to provide enzymatic debridement of nonviable tissue as follows: Apply Santyl to right buttock and right ankle wounds Q day, then cover with moist gauze and foam dressing.  (Change foam dressing Q 3 days or PRN soiling.) Please re-consult if further assistance is needed.  Thank-you,  Julien Girt MSN, [place], [place], [place], [place]

## 2020-04-29 NOTE — Progress Notes (Signed)
Bowel prep given via NG tube successfully. Patient had many bowel movements.  Will continue to monitor.  Christene Slates

## 2020-04-29 NOTE — Progress Notes (Signed)
Amy Antigua, MD [address] [place], [place], [place], [address] [address], [place], [place], [place], [address] Phone: [phone]  Fax: [phone]   Subjective:  Pt at dialysis today. No abdominal pain. No N/V  Objective: Exam: Vital signs in last 24 hours: Vitals:   04/28/20 1156 04/28/20 2004 04/28/20 2201 04/29/20 0652  BP: (!) 111/58 (!) 141/77 (!) 126/56 (!) 106/50  Pulse: 66 (!) 109 78 66  Resp:  (!) 24 16 16   Temp: 98.6 F (37 C) 97.8 F (36.6 C) 99.9 F (37.7 C) 98.2 F (36.8 C)  TempSrc: Oral Oral Oral Oral  SpO2: 96% 100% 96% 96%  Weight:      Height:       Weight change:   Intake/Output Summary (Last 24 hours) at 04/29/2020 1412 Last data filed at 04/29/2020 0746 Gross per 24 hour  Intake 4000 ml  Output 0 ml  Net 4000 ml    General: No acute distress, AAO x3 Abd: Soft, NT/ND, No HSM Skin: Warm, no rashes Neck: Supple, Trachea midline   Lab Results: Lab Results  Component Value Date   WBC 13.3 (H) 04/29/2020   HGB 11.2 (L) 04/29/2020   HCT 31.7 (L) 04/29/2020   MCV 101.3 (H) 04/29/2020   PLT 192 04/29/2020   Micro Results: Recent Results (from the past 240 hour(s))  SARS Coronavirus 2 by RT PCR (hospital order, performed in [place] hospital lab) Nasopharyngeal Nasopharyngeal Swab     Status: None   Collection Time: 04/20/20  1:46 AM   Specimen: Nasopharyngeal Swab  Result Value Ref Range Status   SARS Coronavirus 2 NEGATIVE NEGATIVE Final    Comment: (NOTE) SARS-CoV-2 target nucleic acids are NOT DETECTED.  The SARS-CoV-2 RNA is generally detectable in upper and lower respiratory specimens during the acute phase of infection. The lowest concentration of SARS-CoV-2 viral copies this assay can detect is 250 copies / mL. A negative result does not preclude SARS-CoV-2 infection and should not be used as the sole basis for treatment or other patient management decisions.  A negative result may occur with improper  specimen collection / handling, submission of specimen other than nasopharyngeal swab, presence of viral mutation(s) within the areas targeted by this assay, and inadequate number of viral copies (<250 copies / mL). A negative result must be combined with clinical observations, patient history, and epidemiological information.  Fact Sheet for Patients:   StrictlyIdeas.no  Fact Sheet for Healthcare Providers: BankingDealers.co.za  This test is not yet approved or  cleared by the [place] FDA and has been authorized for detection and/or diagnosis of SARS-CoV-2 by FDA under an Emergency Use Authorization (EUA).  This EUA will remain in effect (meaning this test can be used) for the duration of the COVID-19 declaration under Section 564(b)(1) of the Act, 21 U.S.C. section 360bbb-3(b)(1), unless the authorization is terminated or revoked sooner.  Performed at [facility], [address] [place],  [address]    Studies/Results: [place] Abd 1 View  Result Date: 04/29/2020 CLINICAL DATA:  Nasogastric tube placement EXAM: ABDOMEN - 1 VIEW COMPARISON:  11:14 p.m. FINDINGS: Nasogastric tube extends into the left upper quadrant the abdomen with the proximal side hole within the proximal body of the stomach. Abdomen largely excluded from view. IMPRESSION: Nasogastric tube tip in the proximal body of the stomach. Electronically Signed   By: Fidela Salisbury MD   On: 04/29/2020 01:38   DG Abd 1 View  Result Date: 04/28/2020 CLINICAL  DATA:  NG tube placement EXAM: ABDOMEN - 1 VIEW COMPARISON:  04/23/2020, CT 10/21/2019 FINDINGS: Lower sternotomy and atrial appendage clip. Valve prosthesis. Aorto iliac stent and bilateral renal stents. Esophageal tube tip and side port overlie the gastric body region. IMPRESSION: Esophageal tube tip and side port overlie the gastric body region. Electronically Signed   By: Donavan Foil M.D.   On: 04/28/2020  23:24   Medications:  Scheduled Meds:  (feeding supplement) PROSource Plus  30 mL Oral TID BM   allopurinol  100 mg Oral QHS   amitriptyline  10 mg Oral QHS   vitamin C  500 mg Oral BID   atorvastatin  40 mg Oral QHS   Chlorhexidine Gluconate Cloth  6 each Topical Q0600   collagenase   Topical Daily   epoetin (EPOGEN/PROCRIT) injection  4,000 Units Intravenous Q T,Th,Sa-HD   feeding supplement  1 Container Oral TID BM   gabapentin  400 mg Oral TID   heparin  5,000 Units Subcutaneous Q8H   lipase/protease/amylase  24,000 Units Oral TID   metoprolol tartrate  12.5 mg Oral BID   multivitamin  1 tablet Oral QHS   multivitamin-lutein  1 capsule Oral Daily   nicotine  21 mg Transdermal Daily   pantoprazole  40 mg Oral Daily   senna-docusate  1 tablet Oral BID   sertraline  50 mg Oral Daily   traZODone  150 mg Oral QHS   Continuous Infusions:  dextrose 5 % and 0.9% NaCl 50 mL/hr at 04/29/20 0647   PRN Meds:.acetaminophen **OR** acetaminophen, albuterol, alum & mag hydroxide-simeth, diphenhydrAMINE, heparin, HYDROmorphone, midodrine, nitroGLYCERIN, ondansetron **OR** ondansetron (ZOFRAN) IV, promethazine   Assessment: Principal Problem:   Inadequate oral intake Active Problems:   Essential hypertension   Atrial fibrillation (HCC)   Chronic diastolic heart failure (HCC)   COPD with chronic bronchitis (HCC)   S/P CABG x 3   Diabetes mellitus (HCC)   Chronic kidney disease (CKD), stage IV (severe) (HCC)   Weakness   Severe protein-calorie malnutrition (HCC)   Pressure injury, stage 4 ([place])   ESRD on hemodialysis ([place])   UTI (urinary tract infection)   Infected wound   Failure to thrive in adult   Pressure injury of sacral region, stage 1    Plan: Anesthesia states pt needs to wait for atleast 4 hrs post dialysis for endoscopic procedures  This would lead to her having the procedure after hours which is an add on procedure and timing of procedure is  dependent on other cases going on in the OR at the time and staff availibilty  In order to avoid further NPO status or dehydration in this already cachetic female, we will thus allow pt to resume clear liquid diet today to allow for nutritional intake today and perform the procedure first thing tomorrow morning  Plan communicated to Dr. Billie Ruddy and patient's nurse    LOS: 9 days   Amy Antigua, MD 04/29/2020, 2:12 PM

## 2020-04-29 NOTE — Progress Notes (Signed)
Nutrition Follow Up Note   DOCUMENTATION CODES:   Severe malnutrition in context of chronic illness  INTERVENTION:   If G-tube is able to be placed, recommend:  Osmolite 1.5 $RemoveBefo'@40ml'buNuMddWEgw$ /hr + Pro-Source 88ml daily via tube  Regimen provides 1480kcal/day, 71g/day protein and 936ml/day free water   Ocuvite daily for wound healing (provides zinc, vitamin A, vitamin C, Vitamin E, copper, and selenium)  Rena-vite daily   Vitamin C $RemoveB'500mg'esdVcDpy$  po BID   NUTRITION DIAGNOSIS:   Severe Malnutrition related to chronic illness (CHF, COPD, CABG, ESRD on HD, chronic pancreatits) as evidenced by severe fat depletion, severe muscle depletion, 30 percent weight loss in 1 year and 15 percent weight loss in 3 months.  GOAL:   Patient will meet greater than or equal to 90% of their needs -not met   MONITOR:   PO intake, Supplement acceptance, Weight trends, Labs, Skin, I & O's  ASSESSMENT:   69 y.o. female with medical history significant for s/p CABG and DES, s/p mitral valve repair, ESRD on TTS HD, atrial fibrillation s/p Maze procedure 2012 not on anticoagulation, chronic diastolic CHF (EF 73-41%, P3XT by TTE 05/2018), PAD s/p b/l renal artery stents and left subclavian stent, AAA s/p endovascular repair 2019, chronic pancreatitis on chronic narcotics, COPD on 2 L supplemental O2 via [place] at night, HTN, HLD, RBBB, and depression, who presents to the emergency room with a several day history of weakness and poor oral intake   Pt s/p I & D 8/15  Pt with slightly improved appetite and oral intake; pt eating 50-100% of meals in hospital. Pt refusing most of her ONS. Pt currently on clear liquid diet for scheduled EGD/colonoscopy tomorrow. Pt is also being evaluated for PEG placement. RD will continue to monitor for the need for nutrition support. No new weight since admit; will request daily weights as pt initiated on IVF.   Medications reviewed and include: allopurinol, vitamin C, epoetin, heparin, creon,  rena-vite, ocuvite, nicotine, protonix, senokot, NaCl w/ 5% dextrose $RemoveBef'@50ml'tikEbqdipI$ /hr  Labs reviewed: creat 2.19(H), P 4.8(H), Mg 2.0 wnl  Wbc- 13.3(H)  Diet Order:   Diet Order            Diet NPO time specified  Diet effective midnight                EDUCATION NEEDS:   Education needs have been addressed  Skin:  Skin Assessment: Reviewed RN Assessment (Right outer ankle unstageable pressure injury; 1X1cm, Right buttock with unstageable pressure injury; 4X4.5X.3cm,)  Last BM:  8/24- type 7  Height:   Ht Readings from Last 1 Encounters:  04/20/20 $RemoveB'5\' 5"'ooHJIfzy$  (1.651 m)    Weight:   Wt Readings from Last 1 Encounters:  04/20/20 36.7 kg    Ideal Body Weight:  56.8 kg  BMI:  Body mass index is 13.46 kg/m.  Estimated Nutritional Needs:   Kcal:  1300-1500kcal/day  Protein:  65-75g/day  Fluid:  UOP +1L  Koleen Distance MS, RD, LDN Please refer to [facility] for RD and/or RD on-call/weekend/after hours pager

## 2020-04-29 NOTE — Consult Note (Signed)
[place] for Electrolyte Monitoring and Replacement   Recent Labs: Potassium (mmol/L)  Date Value  04/29/2020 4.5   Magnesium (mg/dL)  Date Value  04/29/2020 2.0   Calcium (mg/dL)  Date Value  04/29/2020 7.9 (L)   Albumin (g/dL)  Date Value  04/24/2020 1.6 (L)   Phosphorus (mg/dL)  Date Value  04/29/2020 4.8 (H)   Sodium  Date Value  04/29/2020 136 mmol/L  07/03/2018 143   Corrected Ca:  9.82 mg/dL  Assessment: 68 year old female presented with weakness, buttock ulcer, and inadequate oral intake. Pt has a PMH of ESRD on HD TTS, CAD s/p CABG, a.fib, mitral valve repair, dCHF, PAD, bilateral renal artery stenosis, severe protein calorie malnutrition, chronic pancreatitis, COPD, HTN, and depression. Pt has a 2-year history of unintentional weight loss + poor appetite.   Goal of Therapy (given cardiac history):  Magnesium 2.0 - 2.4 mg/dL potassium 4.0 - 5.1 mmol/L All Other Electrolytes WNL  Plan:   no additional replacement warranted at this time   Monitor electrolytes with AM labs   Dallie Piles, PharmD 04/29/2020 7:05 AM

## 2020-04-29 NOTE — Progress Notes (Signed)
Placed NG tube and patient inadvertently pulled it out after x ray was taken. Placed another NG tube and x ray came again to verify placement. Patient instructed not to mess with NG tube. Awaiting verification.  [name]

## 2020-04-30 ENCOUNTER — Encounter
Admission: EM | Disposition: A | Discharge status: Hospice | Payer: Self-pay | Source: Home / Self Care | Attending: Hospitalist

## 2020-04-30 ENCOUNTER — Inpatient Hospital Stay: Payer: Medicare Other | Admitting: Anesthesiology

## 2020-04-30 ENCOUNTER — Inpatient Hospital Stay: Payer: Medicare Other

## 2020-04-30 DIAGNOSIS — K635 Polyp of colon: Secondary | ICD-10-CM

## 2020-04-30 DIAGNOSIS — I480 Paroxysmal atrial fibrillation: Secondary | ICD-10-CM

## 2020-04-30 DIAGNOSIS — D175 Benign lipomatous neoplasm of intra-abdominal organs: Secondary | ICD-10-CM

## 2020-04-30 DIAGNOSIS — K209 Esophagitis, unspecified without bleeding: Secondary | ICD-10-CM

## 2020-04-30 DIAGNOSIS — K253 Acute gastric ulcer without hemorrhage or perforation: Secondary | ICD-10-CM

## 2020-04-30 DIAGNOSIS — R1319 Other dysphagia: Secondary | ICD-10-CM

## 2020-04-30 HISTORY — PX: COLONOSCOPY WITH PROPOFOL: SHX5780

## 2020-04-30 HISTORY — PX: ESOPHAGOGASTRODUODENOSCOPY (EGD) WITH PROPOFOL: SHX5813

## 2020-04-30 LAB — BASIC METABOLIC PANEL
Anion gap: 8 (ref 5–15)
BUN: 10 mg/dL (ref 8–23)
CO2: 26 mmol/L (ref 22–32)
Calcium: 7.8 mg/dL — ABNORMAL LOW (ref 8.9–10.3)
Chloride: 102 mmol/L (ref 98–111)
Creatinine, Ser: 1.88 mg/dL — ABNORMAL HIGH (ref 0.44–1.00)
GFR calc Af Amer: 31 mL/min — ABNORMAL LOW (ref >60)
GFR calc non Af Amer: 27 mL/min — ABNORMAL LOW (ref >60)
Glucose, Bld: 67 mg/dL — ABNORMAL LOW (ref 70–99)
Potassium: 4.7 mmol/L (ref 3.5–5.1)
Sodium: 136 mmol/L (ref 135–145)

## 2020-04-30 LAB — CBC
HCT: 26.4 % — ABNORMAL LOW (ref 36.0–46.0)
Hemoglobin: 8.9 g/dL — ABNORMAL LOW (ref 12.0–15.0)
MCH: 35.5 pg — ABNORMAL HIGH (ref 26.0–34.0)
MCHC: 33.7 g/dL (ref 30.0–36.0)
MCV: 105.2 fL — ABNORMAL HIGH (ref 80.0–100.0)
Platelets: 147 10*3/uL — ABNORMAL LOW (ref 150–400)
RBC: 2.51 MIL/uL — ABNORMAL LOW (ref 3.87–5.11)
RDW: 15.9 % — ABNORMAL HIGH (ref 11.5–15.5)
WBC: 10.9 10*3/uL — ABNORMAL HIGH (ref 4.0–10.5)
nRBC: 0 % (ref 0.0–0.2)

## 2020-04-30 LAB — MAGNESIUM: Magnesium: 1.6 mg/dL — ABNORMAL LOW (ref 1.7–2.4)

## 2020-04-30 LAB — GLUCOSE, CAPILLARY
Glucose-Capillary: 102 mg/dL — ABNORMAL HIGH (ref 70–99)
Glucose-Capillary: 59 mg/dL — ABNORMAL LOW (ref 70–99)
Glucose-Capillary: 91 mg/dL (ref 70–99)
Glucose-Capillary: 82 mg/dL (ref 70–99)

## 2020-04-30 LAB — TSH: TSH: 3.016 u[IU]/mL (ref 0.350–4.500)

## 2020-04-30 LAB — PHOSPHORUS: Phosphorus: 3.8 mg/dL (ref 2.5–4.6)

## 2020-04-30 SURGERY — COLONOSCOPY WITH PROPOFOL
Anesthesia: General

## 2020-04-30 SURGERY — EGD (ESOPHAGOGASTRODUODENOSCOPY)
Anesthesia: General

## 2020-04-30 MED ORDER — LIDOCAINE HCL (PF) 2 % IJ SOLN
INTRAMUSCULAR | Status: AC
  Filled 2020-04-30: qty 5

## 2020-04-30 MED ORDER — SODIUM CHLORIDE (PF) 0.9 % IJ SOLN
INTRAMUSCULAR | Status: DC | PRN
  Administered 2020-04-30: 9 mL via INTRAVENOUS

## 2020-04-30 MED ORDER — PROPOFOL 500 MG/50ML IV EMUL
INTRAVENOUS | Status: DC | PRN
  Administered 2020-04-30: 100 ug/kg/min via INTRAVENOUS

## 2020-04-30 MED ORDER — MEGESTROL ACETATE 400 MG/10ML PO SUSP
400.0000 mg | Freq: Every day | ORAL | Status: DC
  Administered 2020-04-30 – 2020-05-02 (×3): 400 mg via ORAL
  Filled 2020-04-30 (×3): qty 10

## 2020-04-30 MED ORDER — SPOT INK MARKER SYRINGE KIT
PACK | SUBMUCOSAL | Status: DC | PRN
  Administered 2020-04-30: 2 mL via SUBMUCOSAL

## 2020-04-30 MED ORDER — EPHEDRINE 5 MG/ML INJ
INTRAVENOUS | Status: AC
  Filled 2020-04-30: qty 10

## 2020-04-30 MED ORDER — DEXTROSE 50 % IV SOLN
12.5000 g | INTRAVENOUS | Status: AC
  Administered 2020-04-30: 12.5 g via INTRAVENOUS
  Filled 2020-04-30: qty 50

## 2020-04-30 MED ORDER — PROPOFOL 500 MG/50ML IV EMUL
INTRAVENOUS | Status: AC
  Filled 2020-04-30: qty 50

## 2020-04-30 MED ORDER — EPHEDRINE SULFATE 50 MG/ML IJ SOLN
INTRAMUSCULAR | Status: DC | PRN
  Administered 2020-04-30 (×2): 5 mg via INTRAVENOUS
  Administered 2020-04-30: 10 mg via INTRAVENOUS
  Administered 2020-04-30 (×3): 5 mg via INTRAVENOUS
  Administered 2020-04-30: 10 mg via INTRAVENOUS
  Administered 2020-04-30: 5 mg via INTRAVENOUS

## 2020-04-30 MED ORDER — SODIUM CHLORIDE 0.9 % IV SOLN: INTRAVENOUS | Status: DC

## 2020-04-30 MED ORDER — PROPOFOL 10 MG/ML IV BOLUS
INTRAVENOUS | Status: DC | PRN
  Administered 2020-04-30: 30 mg via INTRAVENOUS

## 2020-04-30 MED ORDER — GLYCOPYRROLATE 0.2 MG/ML IJ SOLN
INTRAMUSCULAR | Status: AC
  Filled 2020-04-30: qty 1

## 2020-04-30 MED ORDER — PHENYLEPHRINE HCL (PRESSORS) 10 MG/ML IV SOLN
INTRAVENOUS | Status: DC | PRN
  Administered 2020-04-30: 200 ug via INTRAVENOUS
  Administered 2020-04-30: 100 ug via INTRAVENOUS
  Administered 2020-04-30: 50 ug via INTRAVENOUS
  Administered 2020-04-30 (×2): 100 ug via INTRAVENOUS
  Administered 2020-04-30 (×6): 200 ug via INTRAVENOUS

## 2020-04-30 MED ORDER — MAGNESIUM SULFATE 2 GM/50ML IV SOLN
2.0000 g | Freq: Once | INTRAVENOUS | Status: AC
  Administered 2020-04-30: 2 g via INTRAVENOUS
  Filled 2020-04-30: qty 50

## 2020-04-30 MED ORDER — METHYLENE BLUE 0.5 % INJ SOLN
INTRAVENOUS | Status: DC | PRN
  Administered 2020-04-30: 6 mL via SUBMUCOSAL

## 2020-04-30 MED ORDER — PANTOPRAZOLE SODIUM 40 MG IV SOLR
40.0000 mg | Freq: Two times a day (BID) | INTRAVENOUS | Status: DC
  Administered 2020-04-30 – 2020-05-03 (×8): 40 mg via INTRAVENOUS
  Filled 2020-04-30 (×9): qty 40

## 2020-04-30 MED ORDER — LIDOCAINE HCL (CARDIAC) PF 100 MG/5ML IV SOSY
PREFILLED_SYRINGE | INTRAVENOUS | Status: DC | PRN
  Administered 2020-04-30: 40 mg via INTRAVENOUS

## 2020-04-30 NOTE — Anesthesia Preprocedure Evaluation (Signed)
Anesthesia Evaluation  Patient identified by MRN, date of birth, ID band Patient awake  General Assessment Comment:Patient cachectic and ill-appearing  Reviewed: Allergy & Precautions, NPO status , Patient's Chart, lab work & pertinent test results  History of Anesthesia Complications Negative for: history of anesthetic complications  Airway Mallampati: II  TM Distance: >3 FB Neck ROM: Full   Comment: NG tube in place Dental  (+) Edentulous Upper, Edentulous Lower   Pulmonary COPD,  COPD inhaler and oxygen dependent, Current Smoker,  2L [place] at night   Pulmonary exam normal breath sounds clear to auscultation       Cardiovascular hypertension, Pt. on medications and Pt. on home beta blockers + CAD, + Past MI, + Cardiac Stents, + CABG, + Peripheral Vascular Disease and +CHF  Normal cardiovascular exam+ dysrhythmias Atrial Fibrillation + Valvular Problems/Murmurs MR  Rhythm:Regular Rate:Normal  TTE 2019: - Left ventricle: The cavity size was mildly dilated. There was  mild focal basal hypertrophy of the septum. Systolic function was  normal. The estimated ejection fraction was in the range of 60%  to 65%. Wall motion was normal; there were no regional wall  motion abnormalities. Features are consistent with a pseudonormal  left ventricular filling pattern, with concomitant abnormal  relaxation and increased filling pressure (grade 2 diastolic  dysfunction). Doppler parameters are consistent with high  ventricular filling pressure.  - Mitral valve: S/P mitral valve repair with moderate mitral  stenosis. There was mild regurgitation. Mean gradient (D): 9 mm  Hg. Valve area by pressure half-time: 2.24 cm^2. Valve area by  continuity equation (using LVOT flow): 1.17 cm^2.  - Left atrium: The atrium was mildly dilated.  - Pulmonary arteries: PA peak pressure: 36 mm Hg (S).   Heart cath: graft widely patent    Neuro/Psych PSYCHIATRIC DISORDERS Depression negative neurological ROS     GI/Hepatic Neg liver ROS, hiatal hernia, GERD  Medicated and Controlled,  Endo/Other  diabetes, Oral Hypoglycemic Agents  Renal/GU ESRFRenal disease     Musculoskeletal negative musculoskeletal ROS (+) Chronic back pain   Abdominal   Peds  Hematology  (+) anemia ,   Anesthesia Other Findings Past Medical History: 5/09: AAA (abdominal aortic aneurysm) ([place])     Comment:  3.7x3.3 by u/s 2009 No date: Abnormal EKG     Comment:  deep TW inversions chronic No date: Anemia No date: CAD (coronary artery disease)     Comment:  a. CABG 03/2011 LIMA to LAD, SVG to OM, SVG to PDA. b.               cath 06/25/2015 DES to SVG to rPDA, patent LIMA to LAD,               patent SVG to OM 5/09: carotid stenosis     Comment:  S/p L CEA ;  60-79% bilat ICA by preCABG dopplers 7/12 No date: CHF (congestive heart failure) ([place]) No date: Chronic back pain     Comment:  "all over" (06/24/2015) No date: Chronic bronchitis ([place])     Comment:  "get it pretty much q yr" (06/24/2015) No date: CKD (chronic kidney disease) 7001: Complication of anesthesia     Comment:  "problem w/ether" No date: COPD (chronic obstructive pulmonary disease) ([place]) No date: Depression No date: Dysrhythmia No date: GERD (gastroesophageal reflux disease)     Comment:  With hiatal hernia 9/11: GIB (gastrointestinal bleeding)     Comment:  S/p EGD with cautery at [facility] No date: Heart murmur "  a few times": History of blood transfusion     Comment:  "all related to anemia" (06/24/2015) No date: History of hiatal hernia No date: Hyperlipidemia No date: Hypertension No date: Mitral regurgitation     Comment:  3+ MR by intraoperative TEE;  s/p MV repair with Dr.               Roxy Manns 7/12 2012 "several": Myocardial infarction ([place]) No date: On home oxygen therapy     Comment:  "2 liters at night; negative for sleep apnea" No date: PAD (peripheral  artery disease) (HCC)     Comment:  Severe; s/p bilateral renal artery stents, moderate               in-stent restenosis 05/2018: Pancreatitis No date: Paroxysmal atrial fibrillation (HCC)     Comment:  coumadin;  echo 9/07: EF 60%, mild LVH;  s/p Cox Maze               7/12 with LAA clipping No date: Subclavian artery stenosis, left (HCC)     Comment:  stented by Dr. Trula Slade on 10/18 to help flow of her LIMA              to LAD No date: Type II diabetes mellitus ([place])   Reproductive/Obstetrics                             Anesthesia Physical  Anesthesia Plan  ASA: IV  Anesthesia Plan: General   Post-op Pain Management:    Induction: Intravenous  PONV Risk Score and Plan: 2 and Ondansetron, Treatment may vary due to age or medical condition and Propofol infusion  Airway Management Planned: Natural Airway  Additional Equipment: None  Intra-op Plan:   Post-operative Plan: Extubation in OR  Informed Consent: I have reviewed the patients History and Physical, chart, labs and discussed the procedure including the risks, benefits and alternatives for the proposed anesthesia with the patient or authorized representative who has indicated his/her understanding and acceptance.     Dental advisory given  Plan Discussed with: CRNA  Anesthesia Plan Comments: (Patient denies nausea or vomiting. NG tube was placed for administration of colonoscopy prep. Appropriate NPO time. Discussed risks of anesthesia with patient, including possibility of difficulty with spontaneous ventilation under anesthesia necessitating airway intervention, PONV, and rare risks such as aspiration, cardiac or respiratory or neurological events. Patient understands. Patient counseled on being higher risk for anesthesia due to comorbidities: CAD, CHF, COPD, cachexia. Patient was told about increased risk of cardiac and respiratory events, including death. Patient understands. )         Anesthesia Quick Evaluation

## 2020-04-30 NOTE — Progress Notes (Signed)
PROGRESS NOTE    [name]  GNF:[id number] DOB: [birth year] DOA: 04/19/2020 PCP: Bernerd Limbo, MD   Chief complaint.  Anorexia.  Brief Narrative:  Briefly this is a 69 year old Caucasian female with extensive past medical history of ESRD on hemodialysis TTS, CAD status post CABG, status post mitral valve repair, atrial fibrillation on anticoagulation, chronic diastolic congestive heart failure, PAD, bilateral renal artery stenosis, severe protein calorie malnutrition, chronic pancreatitis, chronic narcotic use, COPD, hypertension, depression.  She presented to the emergency department yesterday with weakness, generalized pain, acute on chronic pain of the right buttock ulcer. The patient's been followed by wound care clinic. However the wound had worsened and pain worsened to the point that the patient presented to the emergency department. She is also markedly malnourished weight has dropped to 37.6 kg. BMI 13.8.  Assessment & Plan:   Principal Problem:   Inadequate oral intake Active Problems:   Essential hypertension   Atrial fibrillation (HCC)   Chronic diastolic heart failure (HCC)   COPD with chronic bronchitis (HCC)   S/P CABG x 3   Diabetes mellitus (HCC)   Chronic kidney disease (CKD), stage IV (severe) (HCC)   Weakness   Severe protein-calorie malnutrition (HCC)   Pressure injury, stage 4 (HCC)   ESRD on hemodialysis (HCC)   Weight loss, unintentional   UTI (urinary tract infection)   Infected wound   Failure to thrive in adult   Pressure injury of sacral region, stage 1   Esophageal dysphagia   Acute esophagitis   Acute gastric ulcer without hemorrhage or perforation   Polyp of ascending colon   Lipoma of colon  #1.  Infected right buttock wound, right ankle wound stage II. Decubitus ulcer secondary to severe protein calorie malnutrition. Patient is a followed by wound care. Finished 5 days of Unasyn.  #2.  Anorexia, severe protein calorie  malnutrition, adult failure to thrive. Patient has Albumin 1.6, cachectic.  Was treated with Marinol and Remeron, did not have any effect.  EGD/colonoscopy today.  Still has very little p.o. intake, GI is planning for G-tube placement tomorrow. Physical examination showed an elongated abdominal mass, reviewed patient abdominal films, this is most likely due to aortic stents placed in the past.  Will obtain abdominal/pelvis CT scan without contrast to confirm.  #3.  Type 2 diabetes with hypoglycemia secondary to poor oral intake. Currently on D5.  4.  End-stage renal disease on hemodialysis. Per nephrology.  5.  Urinary tract infection. Completed 5 days of antibiotics.  6.  Atrial fibrillation. Not on anticoagulation.  Rate under control.  7.  Chronic diastolic congestive heart failure. No evidence of volume overload.  8.  Anemia of chronic disease.  9.  Likely dementia. Patient has been very confused.        DVT prophylaxis: Heparin Code Status: Full Family Communication: None Disposition Plan:  . Patient came from: Home            . Anticipated d/c place: SNF . Barriers to d/c OR conditions which need to be met to effect a safe d/c:   Consultants:   GI  Procedures:EGD and colonoscopy Antimicrobials: None  Subjective: Patient is quite confused.  She still has a very poor appetite.  Spoke with the nurse, patient barely eat anything.  But no nausea vomiting.  No abdominal pain. No fever or chills.  Objective: Vitals:   04/30/20 1122 04/30/20 1132 04/30/20 1140 04/30/20 1155  BP: (!) 95/59 104/75 95/75 (!) 92/57  Pulse: 99  100 97 100  Resp: _0 Temp:    97.8 F (36.6 C)  TempSrc:    Oral  SpO2: 98% 100% 100% 94%  Weight:      Height:        Intake/Output Summary (Last 24 hours) at 04/30/2020 1158 Last data filed at 04/30/2020 1124 Gross per 24 hour  Intake 300 ml  Output 1 ml  Net 299 ml   Filed Weights   04/19/20 1218 04/20/20 2033  04/30/20 0500  Weight: 37.6 kg 36.7 kg 43.6 kg    Examination:  General exam: Ill-appearing, cachectic. Respiratory system: Clear to auscultation. Respiratory effort normal. Cardiovascular system: Irregular. No JVD, murmurs, rubs, gallops or clicks. No pedal edema. Gastrointestinal system: Abdomen is nondistended, soft and nontender.  There is a elongated mass in the mid abdomen, 4x10 cm.  Central nervous system: Alert and confused. No focal neurological deficits. Extremities: Symmetric  Skin: Decubitus ulcer noted. Psychiatry: Mood & affect appropriate.     Data Reviewed: I have personally reviewed following labs and imaging studies  CBC: Recent Labs  Lab 04/26/20 0522 04/27/20 0531 04/28/20 0338 04/29/20 0418 04/30/20 0731  WBC 8.6 6.7 7.6 13.3* 10.9*  HGB 11.3* 8.9* 8.7* 11.2* 8.9*  HCT 34.6* 27.1* 26.2* 31.7* 26.4*  MCV 106.5* 106.3* 105.2* 101.3* 105.2*  PLT 210 133* 147* 192 409*   Basic Metabolic Panel: Recent Labs  Lab 04/26/20 0522 04/26/20 0522 04/27/20 0531 04/27/20 2143 04/28/20 0338 04/29/20 0418 04/30/20 0731  NA 138  --  142  --  136 136 136  K 4.1   < > 3.0* 4.8 5.0 4.5 4.7  CL 101  --  107  --  102 99 102  CO2 26  --  27  --  _1 GLUCOSE 102*  --  637*  --  125* 134* 67*  BUN 14  --  10  --  _2 CREATININE 2.42*  --  1.82*  --  2.09* 2.19* 1.88*  CALCIUM 8.1*  --  6.7*  --  7.7* 7.9* 7.8*  MG 2.7*  --  1.8  --  2.1 2.0 1.6*  PHOS 4.3   < > 2.4* 4.9* 5.2* 4.8* 3.8   < > = values in this interval not displayed.   GFR: Estimated Creatinine Clearance: 20 mL/min (A) (by C-G formula based on SCr of 1.88 mg/dL (H)). Liver Function Tests: Recent Labs  Lab 04/24/20 0416  ALBUMIN 1.6*   No results for input(s): LIPASE, AMYLASE in the last 168 hours. No results for input(s): AMMONIA in the last 168 hours. Coagulation Profile: No results for input(s): INR, PROTIME in the last 168 hours. Cardiac Enzymes: No results for input(s):  CKTOTAL, CKMB, CKMBINDEX, TROPONINI in the last 168 hours. BNP (last 3 results) No results for input(s): PROBNP in the last 8760 hours. HbA1C: No results for input(s): HGBA1C in the last 72 hours. CBG: Recent Labs  Lab 04/28/20 1622 04/28/20 2043 04/29/20 0736 04/29/20 1647 04/29/20 2042  GLUCAP 118* 94 93 80 84   Lipid Profile: No results for input(s): CHOL, HDL, LDLCALC, TRIG, CHOLHDL, LDLDIRECT in the last 72 hours. Thyroid Function Tests: No results for input(s): TSH, T4TOTAL, FREET4, T3FREE, THYROIDAB in the last 72 hours. Anemia Panel: Recent Labs    04/28/20 0338  VITAMINB12 559  FOLATE 50.0  TIBC NOT CALCULATED  IRON 35   Sepsis Labs: No results for input(s): PROCALCITON, LATICACIDVEN in the last 168 hours.  No results found for this or any previous visit (from the past 240 hour(s)).       Radiology Studies: DG Abd 1 View  Result Date: 04/29/2020 CLINICAL DATA:  Nasogastric tube placement EXAM: ABDOMEN - 1 VIEW COMPARISON:  11:14 p.m. FINDINGS: Nasogastric tube extends into the left upper quadrant the abdomen with the proximal side hole within the proximal body of the stomach. Abdomen largely excluded from view. IMPRESSION: Nasogastric tube tip in the proximal body of the stomach. Electronically Signed   By: Fidela Salisbury MD   On: 04/29/2020 01:38   DG Abd 1 View  Result Date: 04/28/2020 CLINICAL DATA:  NG tube placement EXAM: ABDOMEN - 1 VIEW COMPARISON:  04/23/2020, CT 10/21/2019 FINDINGS: Lower sternotomy and atrial appendage clip. Valve prosthesis. Aorto iliac stent and bilateral renal stents. Esophageal tube tip and side port overlie the gastric body region. IMPRESSION: Esophageal tube tip and side port overlie the gastric body region. Electronically Signed   By: Donavan Foil M.D.   On: 04/28/2020 23:24        Scheduled Meds: . [MAR Hold] (feeding supplement) PROSource Plus  30 mL Oral TID BM  . [MAR Hold] allopurinol  100 mg Oral QHS  . [MAR Hold]  amitriptyline  10 mg Oral QHS  . [MAR Hold] vitamin C  500 mg Oral BID  . [MAR Hold] atorvastatin  40 mg Oral QHS  . [MAR Hold] Chlorhexidine Gluconate Cloth  6 each Topical Q0600  . [MAR Hold] collagenase   Topical Daily  . [MAR Hold] epoetin (EPOGEN/PROCRIT) injection  4,000 Units Intravenous Q T,Th,Sa-HD  . [MAR Hold] feeding supplement  1 Container Oral TID BM  . [MAR Hold] gabapentin  400 mg Oral TID  . [MAR Hold] heparin  5,000 Units Subcutaneous Q8H  . [MAR Hold] lipase/protease/amylase  24,000 Units Oral TID  . [MAR Hold] metoprolol tartrate  12.5 mg Oral BID  . [MAR Hold] multivitamin  1 tablet Oral QHS  . [MAR Hold] multivitamin-lutein  1 capsule Oral Daily  . [MAR Hold] nicotine  21 mg Transdermal Daily  . pantoprazole (PROTONIX) IV  40 mg Intravenous Q12H  . [MAR Hold] polyethylene glycol-electrolytes  2,000 mL Oral Once  . [MAR Hold] senna-docusate  1 tablet Oral BID  . [MAR Hold] sertraline  50 mg Oral Daily  . [MAR Hold] traZODone  150 mg Oral QHS   Continuous Infusions: . sodium chloride    . dextrose 5 % and 0.9% NaCl 50 mL/hr at 04/29/20 0647     LOS: 10 days    Time spent: 28 minutes    Sharen Hones, MD [facility]   To contact the attending provider between 7A-7P or the covering provider during after hours 7P-7A, please log into the web site [email or web address] and access using universal [place] password for that web site. If you do not have the password, please call the hospital operator.  04/30/2020, 11:58 AM

## 2020-04-30 NOTE — Op Note (Signed)
[facility] Gastroenterology Patient Name: [name] Procedure Date: 04/30/2020 10:05 AM MRN: [id number] Account #: [id number] Date of Birth: 1951-01-26 Admit Type: Outpatient Age: 69 Room: [facility] ENDO ROOM 2 Gender: Female Note Status: Finalized Procedure:             Upper GI endoscopy Indications:           Dysphagia, Weight loss Providers:             Anetra Czerwinski B. Bonna Gains MD, MD Medicines:             Monitored Anesthesia Care Complications:         No immediate complications. Procedure:             Pre-Anesthesia Assessment:                        - The risks and benefits of the procedure and the                         sedation options and risks were discussed with the                         patient. All questions were answered and informed                         consent was obtained.                        - Patient identification and proposed procedure were                         verified prior to the procedure.                        - ASA Grade Assessment: III - A patient with severe                         systemic disease.                        After obtaining informed consent, the endoscope was                         passed under direct vision. Throughout the procedure,                         the patient's blood pressure, pulse, and oxygen                         saturations were monitored continuously. The Endoscope                         was introduced through the mouth, and advanced to the                         second part of duodenum. The upper GI endoscopy was                         accomplished with ease. The patient tolerated the  procedure well. Findings:      LA Grade D (one or more mucosal breaks involving at least 75% of       esophageal circumference) esophagitis with no bleeding was found in the       distal esophagus.      Three superficial esophageal ulcers with no bleeding and no stigmata of        recent bleeding were found in the distal esophagus. The largest lesion       was 6 mm in largest dimension.      One superficial gastric ulcer with pigmented material was found in the       gastric body. The lesion was 12 mm in largest dimension.      A medium amount of food (residue) was found in the gastric fundus.      The exam of the stomach was otherwise normal.      The examined duodenum was normal. Impression:            - LA Grade D esophagitis with no bleeding.                        - Esophageal ulcers with no bleeding and no stigmata                         of recent bleeding.                        - Gastric ulcer with pigmented material.                        - A medium amount of food (residue) in the stomach.                        - Normal examined duodenum.                        - No specimens collected. Recommendation:        - Return patient to hospital ward for ongoing care.                        - Continue present medications.                        - The findings and recommendations were discussed with                         the patient.                        - The patient's esophagitis and esophageal ulcers are                         the likely cause of her dysphagia and weight loss. In                         the presence of gastric ulcer would not recommend                         feeding tube placement at this time especially since  the pt is actually able to swallow and could increase                         her caloric intake with diet and agressive PPI and                         antireflux measures.                        - Follow an antireflux regimen.                        - Recommend acid suppression medication PPI IV BID                         while inpatient and PO BID as outpatient for 8-12                         weeks.                        - Repeat upper endoscopy 6-8 weeks to check healing. Procedure Code(s):     ---  Professional ---                        [phone], Esophagogastroduodenoscopy, flexible,                         transoral; diagnostic, including collection of                         specimen(s) by brushing or washing, when performed                         (separate procedure) Diagnosis Code(s):     --- Professional ---                        K20.90, Esophagitis, unspecified without bleeding                        K22.10, Ulcer of esophagus without bleeding                        K25.9, Gastric ulcer, unspecified as acute or chronic,                         without hemorrhage or perforation                        R13.10, Dysphagia, unspecified                        R63.4, Abnormal weight loss CPT copyright 2019 [employer] Medical [employer]. All rights reserved. The codes documented in this report are preliminary and upon coder review may  be revised to meet current compliance requirements.  Vonda Antigua, MD Margretta Sidle B. Bonna Gains MD, MD 04/30/2020 10:36:34 AM This report has been signed electronically. Number of Addenda: 0 Note Initiated On: 04/30/2020 10:05 AM Estimated Blood Loss:  Estimated blood loss: none.      [facility]

## 2020-04-30 NOTE — Op Note (Addendum)
[facility] Patient Name: [name] Procedure Date: 04/30/2020 10:04 AM MRN: [id number] Account #: [id number] Date of Birth: [birth year] Admit Type: Outpatient Age: 69 Room: [facility] ENDO ROOM 2 Gender: Female Note Status: Finalized Procedure:             Colonoscopy Indications:           Weight loss Providers:             Aleatha Taite B. Bonna Gains MD, MD Medicines:             Monitored Anesthesia Care Complications:         No immediate complications. Procedure:             Pre-Anesthesia Assessment:                        - Prior to the procedure, a History and Physical was                         performed, and patient medications, allergies and                         sensitivities were reviewed. The patient's tolerance                         of previous anesthesia was reviewed.                        - The risks and benefits of the procedure and the                         sedation options and risks were discussed with the                         patient. All questions were answered and informed                         consent was obtained.                        - Patient identification and proposed procedure were                         verified prior to the procedure by the physician, the                         nurse, the anesthesiologist, the anesthetist and the                         technician. The procedure was verified in the                         pre-procedure area in the procedure room in the                         endoscopy suite.                        - Prophylactic Antibiotics: The patient does not  require prophylactic antibiotics.                        - ASA Grade Assessment: III - A patient with severe                         systemic disease.                        - After reviewing the risks and benefits, the patient                         was deemed in satisfactory condition to undergo the                          procedure.                        - Monitored anesthesia care was determined to be                         medically necessary for this procedure based on review                         of the patient's medical history, medications, and                         prior anesthesia history.                        - The anesthesia plan was to use monitored anesthesia                         care (MAC).                        After obtaining informed consent, the colonoscope was                         passed under direct vision. Throughout the procedure,                         the patient's blood pressure, pulse, and oxygen                         saturations were monitored continuously. The                         Colonoscope was introduced through the anus and                         advanced to the the cecum, identified by appendiceal                         orifice and ileocecal valve. The colonoscopy was                         performed with ease. The patient tolerated the  procedure well. The quality of the bowel preparation                         was good. Findings:      The perianal and digital rectal examinations were normal.      A 15 mm polyp was found in the ascending colon. The polyp was sessile.       Polypectomy was attempted, initially using a saline injection-lift       technique with a hot snare. Polyp resection was incomplete with this       device. This intervention then required a different device and       polypectomy technique. The polyp was removed with a cold snare.       Resection and retrieval were complete. Area was tattooed with an       injection of Spot (carbon black).      A 5 mm polyp was found in the transverse colon. The polyp was sessile.       The polyp was removed with a cold snare. Resection and retrieval were       complete.      There was a medium-sized lipoma, at 40 cm proximal to the anus. Biopsies        were taken with a cold forceps for histology.      There was evidence of a prior surgical anastomosis in the sigmoid colon.       This was patent and was characterized by healthy appearing mucosa.      Non-bleeding internal hemorrhoids were found during retroflexion. Impression:            - One 15 mm polyp in the ascending colon, removed with                         a cold snare. Resected and retrieved. Tattooed.                        - One 5 mm polyp in the transverse colon, removed with                         a cold snare. Resected and retrieved.                        - Medium-sized lipoma at 40 cm proximal to the anus.                         Biopsied.                        - Patent surgical anastomosis, characterized by                         healthy appearing mucosa.                        - Non-bleeding internal hemorrhoids. Recommendation:        - Repeat colonoscopy in 1 year to review the                         polypectomy site.                        -  Return to GI clinic Dr. Rush Landmark in 4-6 weeks.                        - Resume previous diet.                        - Continue present medications.                        - The findings and recommendations were discussed with                         the patient. Procedure Code(s):     --- Professional ---                        [phone], Colonoscopy, flexible; with removal of                         tumor(s), polyp(s), or other lesion(s) by snare                         technique                        45381, Colonoscopy, flexible; with directed submucosal                         injection(s), any substance                        08811, 45, Colonoscopy, flexible; with biopsy, single                         or multiple Diagnosis Code(s):     --- Professional ---                        K63.5, Polyp of colon                        D17.5, Benign lipomatous neoplasm of intra-abdominal                         organs                         Z98.0, Intestinal bypass and anastomosis status                        K64.8, Other hemorrhoids                        R63.4, Abnormal weight loss CPT copyright 2019 American Medical Association. All rights reserved. The codes documented in this report are preliminary and upon coder review may  be revised to meet current compliance requirements.  Vonda Antigua, MD Margretta Sidle B. Bonna Gains MD, MD 04/30/2020 11:26:58 AM This report has been signed electronically. Number of Addenda: 0 Note Initiated On: 04/30/2020 10:04 AM Scope Withdrawal Time: 0 hours 33 minutes 49 seconds  Total Procedure Duration: 0 hours 36 minutes 9 seconds  Estimated Blood Loss:  Estimated blood loss: none.      [facility]

## 2020-04-30 NOTE — Transfer of Care (Signed)
Immediate Anesthesia Transfer of Care Note  Patient: Harleyquinn Gasser Dolin  Procedure(s) Performed: COLONOSCOPY WITH PROPOFOL (N/A ) ESOPHAGOGASTRODUODENOSCOPY (EGD) WITH PROPOFOL (N/A )  Patient Location: Endoscopy Unit  Anesthesia Type:General  Level of Consciousness: drowsy  Airway & Oxygen Therapy: Patient Spontanous Breathing and Patient connected to face mask oxygen  Post-op Assessment: Report given to RN and Post -op Vital signs reviewed and stable  Post vital signs: Reviewed and stable  Last Vitals:  Vitals Value Taken Time  BP 95/59 04/30/20 1122  Temp    Pulse 98 04/30/20 1124  Resp 14 04/30/20 1124  SpO2 100 % 04/30/20 1124  Vitals shown include unvalidated device data.  Last Pain:  Vitals:   04/30/20 1122  TempSrc:   PainSc: Asleep      Patients Stated Pain Goal: 0 (42/59/56 3875)  Complications: No complications documented.

## 2020-04-30 NOTE — Progress Notes (Signed)
Cancellation Note:  Pt currently off unit for procedure.  Will re-attempt PT treatment session at a later date/time as medically appropriate.  Leitha Bleak, PT 04/30/20, 10:04 AM

## 2020-04-30 NOTE — Progress Notes (Signed)
[facility], [place] 04/30/20  Subjective:   LOS: 10 Patient with NG tube in place.  Appears quite lethargic today. Having poor p.o. intake. .    Objective:  Vital signs in last 24 hours:  Temp:  [97.4 F (36.3 C)-98.7 F (37.1 C)] 98.5 F (36.9 C) (08/25 1349) Pulse Rate:  [73-100] 87 (08/25 1349) Resp:  [12-20] 14 (08/25 1349) BP: (86-122)/(48-75) 86/52 (08/25 1349) SpO2:  [94 %-100 %] 97 % (08/25 1349) Weight:  [43.6 kg] 43.6 kg (08/25 0500)  Weight change:  Filed Weights   04/19/20 1218 04/20/20 2033 04/30/20 0500  Weight: 37.6 kg 36.7 kg 43.6 kg    Intake/Output:    Intake/Output Summary (Last 24 hours) at 04/30/2020 1458 Last data filed at 04/30/2020 1124 Gross per 24 hour  Intake 300 ml  Output 1 ml  Net 299 ml    Physical Exam: General:  In no acute distress, lethargic  HEENT  NGT in place, mucous membranes moist  Pulm/lungs  Lungs clear  CVS/Heart  S1S2,no rub or gallop  Abdomen:   Soft, nontender  Extremities:  No peripheral edema   Neurologic:  Alert, oriented  Skin:  Chronic decubitus ulcers +  Left arm AV fistula    Basic Metabolic Panel:  Recent Labs  Lab 04/26/20 0522 04/26/20 0522 04/27/20 0531 04/27/20 0531 04/27/20 2143 04/28/20 0338 04/29/20 0418 04/30/20 0731  NA 138  --  142  --   --  136 136 136  K 4.1   < > 3.0*  --  4.8 5.0 4.5 4.7  CL 101  --  107  --   --  102 99 102  CO2 26  --  27  --   --  25 26 26   GLUCOSE 102*  --  637*  --   --  125* 134* 67*  BUN 14  --  10  --   --  12 14 10   CREATININE 2.42*  --  1.82*  --   --  2.09* 2.19* 1.88*  CALCIUM 8.1*   < > 6.7*   < >  --  7.7* 7.9* 7.8*  MG 2.7*  --  1.8  --   --  2.1 2.0 1.6*  PHOS 4.3   < > 2.4*  --  4.9* 5.2* 4.8* 3.8   < > = values in this interval not displayed.     CBC: Recent Labs  Lab 04/26/20 0522 04/27/20 0531 04/28/20 0338 04/29/20 0418 04/30/20 0731  WBC 8.6 6.7 7.6 13.3* 10.9*  HGB 11.3* 8.9* 8.7* 11.2* 8.9*  HCT  34.6* 27.1* 26.2* 31.7* 26.4*  MCV 106.5* 106.3* 105.2* 101.3* 105.2*  PLT 210 133* 147* 192 147*      Lab Results  Component Value Date   HEPBSAG NON REACTIVE 04/22/2020   HEPBSAB Non Reactive 07/27/2018      Microbiology:  No results found for this or any previous visit (from the past 240 hour(s)).  Coagulation Studies: No results for input(s): LABPROT, INR in the last 72 hours.  Urinalysis: No results for input(s): COLORURINE, LABSPEC, PHURINE, GLUCOSEU, HGBUR, BILIRUBINUR, KETONESUR, PROTEINUR, UROBILINOGEN, NITRITE, LEUKOCYTESUR in the last 72 hours.  Invalid input(s): APPERANCEUR    Imaging: DG Abd 1 View  Result Date: 04/29/2020 CLINICAL DATA:  Nasogastric tube placement EXAM: ABDOMEN - 1 VIEW COMPARISON:  11:14 p.m. FINDINGS: Nasogastric tube extends into the left upper quadrant the abdomen with the proximal side hole within the proximal body of the stomach.  Abdomen largely excluded from view. IMPRESSION: Nasogastric tube tip in the proximal body of the stomach. Electronically Signed   By: Fidela Salisbury MD   On: 04/29/2020 01:38   DG Abd 1 View  Result Date: 04/28/2020 CLINICAL DATA:  NG tube placement EXAM: ABDOMEN - 1 VIEW COMPARISON:  04/23/2020, CT 10/21/2019 FINDINGS: Lower sternotomy and atrial appendage clip. Valve prosthesis. Aorto iliac stent and bilateral renal stents. Esophageal tube tip and side port overlie the gastric body region. IMPRESSION: Esophageal tube tip and side port overlie the gastric body region. Electronically Signed   By: Donavan Foil M.D.   On: 04/28/2020 23:24     Medications:   . dextrose 5 % and 0.9% NaCl 50 mL/hr at 04/30/20 1445   . (feeding supplement) PROSource Plus  30 mL Oral TID BM  . allopurinol  100 mg Oral QHS  . amitriptyline  10 mg Oral QHS  . vitamin C  500 mg Oral BID  . atorvastatin  40 mg Oral QHS  . Chlorhexidine Gluconate Cloth  6 each Topical Q0600  . collagenase   Topical Daily  . epoetin (EPOGEN/PROCRIT)  injection  4,000 Units Intravenous Q T,Th,Sa-HD  . feeding supplement  1 Container Oral TID BM  . gabapentin  400 mg Oral TID  . heparin  5,000 Units Subcutaneous Q8H  . lipase/protease/amylase  24,000 Units Oral TID  . metoprolol tartrate  12.5 mg Oral BID  . multivitamin  1 tablet Oral QHS  . multivitamin-lutein  1 capsule Oral Daily  . nicotine  21 mg Transdermal Daily  . pantoprazole (PROTONIX) IV  40 mg Intravenous Q12H  . polyethylene glycol-electrolytes  2,000 mL Oral Once  . senna-docusate  1 tablet Oral BID  . sertraline  50 mg Oral Daily  . traZODone  150 mg Oral QHS   acetaminophen **OR** acetaminophen, albuterol, alum & mag hydroxide-simeth, diphenhydrAMINE, heparin, HYDROmorphone, midodrine, nitroGLYCERIN, ondansetron **OR** ondansetron (ZOFRAN) IV, promethazine  Assessment/ Plan:  69 y.o. female with medical problems of ESRD, on hemodialysis, CAD/CABG 03/2011, COPD, atrial fibrillation, hypertension, COPD, chronic pancreatitis and chronic abdominal pain requiring recurrent hospitalizations , h/o AAA, cholecystectomy, severe peripheral arterial disease including bilateral renal artery stent, subclavian artery stenosis, left, left CEA  was admitted on 04/19/2020 for generalized weakness and pain.  Principal Problem:   Inadequate oral intake Active Problems:   Essential hypertension   Atrial fibrillation (HCC)   Chronic diastolic heart failure (HCC)   COPD with chronic bronchitis (HCC)   S/P CABG x 3   Diabetes mellitus (HCC)   Chronic kidney disease (CKD), stage IV (severe) (HCC)   Weakness   Severe protein-calorie malnutrition (HCC)   Pressure injury, stage 4 (HCC)   ESRD on hemodialysis (HCC)   Weight loss, unintentional   UTI (urinary tract infection)   Infected wound   Failure to thrive in adult   Pressure injury of sacral region, stage 1   Esophageal dysphagia   Acute esophagitis   Acute gastric ulcer without hemorrhage or perforation   Polyp of ascending  colon   Lipoma of colon  Infected wound [T14.8XXA, L08.9] Weakness [R53.1] Failure to thrive in adult [R62.7] Inadequate oral intake [R63.8] Acute cystitis with hematuria [N30.01] Wound of right buttock, initial encounter [S31.819A] Pressure injury of sacral region, stage 1 [place] [facility]/TTS/left arm AV fistula/CK [place]  #. ESRD Patient received dialysis yesterday Will continue TTS schedule.  #. Anemia of CKD  Lab Results  Component Value Date   HGB  8.9 (L) 04/30/2020   Will continue low dose EPO with HD  #. Secondary hyperparathyroidism of renal origin N 25.81      Component Value Date/Time   PTH 80 (H) 08/15/2018 1435   Lab Results  Component Value Date   PHOS 3.8 04/30/2020  Phosphorus currently at target.  Not currently on binders.    #.  Rt Buttock wound S/p I & D rt ischial decub ulcer,managing by the wound care team May be contributed by severe protein calorie malnutrition Plan to place a G tube, currently has an NGT in place  #Urinary tract infection E. coli and Enterococcus faecalis, treated with antibiotics.   #Malnutrition Patient has anorexia and dysphagia.  Currently has an NGT in place for feeding. GI team planning to do endoscopy and colonoscopy after Plavix wash out.    LOS: [place] 8/25/20212:58 [place], [place]

## 2020-04-30 NOTE — Consult Note (Signed)
[place] for Electrolyte Monitoring and Replacement   Recent Labs: Potassium (mmol/L)  Date Value  04/30/2020 4.7   Magnesium (mg/dL)  Date Value  04/30/2020 1.6 (L)   Calcium (mg/dL)  Date Value  04/30/2020 7.8 (L)   Albumin (g/dL)  Date Value  04/24/2020 1.6 (L)   Phosphorus (mg/dL)  Date Value  04/30/2020 3.8   Sodium  Date Value  04/30/2020 136 mmol/L  07/03/2018 143   Corrected Ca:  9.72 mg/dL  Assessment: 69 year old female presented with weakness, buttock ulcer, and inadequate oral intake. Pt has a PMH of ESRD on HD TTS, CAD s/p CABG, a.fib, mitral valve repair, dCHF, PAD, bilateral renal artery stenosis, severe protein calorie malnutrition, chronic pancreatitis, COPD, HTN, and depression. Pt has a 2-year history of unintentional weight loss + poor appetite. Phos trending down, but still WNL.   Goal of Therapy (given cardiac history):  Magnesium 2.0 - 2.4 mg/dL potassium 4.0 - 5.1 mmol/L All Other Electrolytes WNL  Plan:   Mg 1.6 - will give 2g IV Mg sulfate x1  Monitor electrolytes with AM labs   Sherilyn Banker, PharmD Pharmacy Resident  04/30/2020 1:06 PM

## 2020-04-30 NOTE — Progress Notes (Signed)
Spoke with Rufina Falco regarding patient's lack of IV access and was informed that day shift will have to decide what type of access. [place] spoke to nephrology and a PICC line is not an option. The IV team tonight is not trained in EJ insertion.  Will continue to monitor.  Christene Slates

## 2020-04-30 NOTE — Anesthesia Postprocedure Evaluation (Signed)
Anesthesia Post Note  Patient: [name]  Procedure(s) Performed: COLONOSCOPY WITH PROPOFOL (N/A ) ESOPHAGOGASTRODUODENOSCOPY (EGD) WITH PROPOFOL (N/A )  Patient location during evaluation: Endoscopy Anesthesia Type: General Level of consciousness: awake and alert Pain management: pain level controlled Vital Signs Assessment: post-procedure vital signs reviewed and stable Respiratory status: spontaneous breathing, nonlabored ventilation, respiratory function stable and patient connected to nasal cannula oxygen Cardiovascular status: blood pressure returned to baseline and stable Postop Assessment: no apparent nausea or vomiting Anesthetic complications: no   No complications documented.   Last Vitals:  Vitals:   04/30/20 1155 04/30/20 1254  BP: (!) 92/57 110/60  Pulse: 100 90  Resp: 13 15  Temp: 36.6 C (!) 36.3 C  SpO2: 94% 97%    Last Pain:  Vitals:   04/30/20 1254  TempSrc: Oral  PainSc:                  [name]

## 2020-04-30 NOTE — Progress Notes (Signed)
Vonda Antigua, MD [address], [place], [place], [place], [address] [address], [place], [place], [place], [address] Phone: [phone]  Fax: [phone]   Subjective: Patient had IV line placed by IV team today.  Patient completed more prep yesterday and can proceed with her procedure today   Objective: Exam: Vital signs in last 24 hours: Vitals:   04/29/20 2045 04/30/20 0500 04/30/20 0512 04/30/20 0956  BP: 122/65  (!) 109/48 (!) 92/55  Pulse: 96  73 81  Resp: 16  16 20   Temp: 98.7 F (37.1 C)  98 F (36.7 C) 98.2 F (36.8 C)  TempSrc: Oral  Oral Temporal  SpO2: 95%  94% 95%  Weight:  43.6 kg    Height:       Weight change:   Intake/Output Summary (Last 24 hours) at 04/30/2020 1005 Last data filed at 04/29/2020 2045 Gross per 24 hour  Intake 0 ml  Output 1 ml  Net -1 ml    General: No acute distress, AAO x3 Abd: Soft, NT/ND, No HSM Skin: Warm, no rashes Neck: Supple, Trachea midline   Lab Results: Lab Results  Component Value Date   WBC 10.9 (H) 04/30/2020   HGB 8.9 (L) 04/30/2020   HCT 26.4 (L) 04/30/2020   MCV 105.2 (H) 04/30/2020   PLT 147 (L) 04/30/2020   Micro Results: No results found for this or any previous visit (from the past 240 hour(s)). Studies/Results: DG Abd 1 View  Result Date: 04/29/2020 CLINICAL DATA:  Nasogastric tube placement EXAM: ABDOMEN - 1 VIEW COMPARISON:  11:14 p.m. FINDINGS: Nasogastric tube extends into the left upper quadrant the abdomen with the proximal side hole within the proximal body of the stomach. Abdomen largely excluded from view. IMPRESSION: Nasogastric tube tip in the proximal body of the stomach. Electronically Signed   By: Fidela Salisbury MD   On: 04/29/2020 01:38   DG Abd 1 View  Result Date: 04/28/2020 CLINICAL DATA:  NG tube placement EXAM: ABDOMEN - 1 VIEW COMPARISON:  04/23/2020, CT 10/21/2019 FINDINGS: Lower sternotomy and atrial appendage clip. Valve prosthesis. Aorto iliac stent and  bilateral renal stents. Esophageal tube tip and side port overlie the gastric body region. IMPRESSION: Esophageal tube tip and side port overlie the gastric body region. Electronically Signed   By: Donavan Foil M.D.   On: 04/28/2020 23:24   Medications:  Scheduled Meds: . [MAR Hold] (feeding supplement) PROSource Plus  30 mL Oral TID BM  . [MAR Hold] allopurinol  100 mg Oral QHS  . [MAR Hold] amitriptyline  10 mg Oral QHS  . [MAR Hold] vitamin C  500 mg Oral BID  . [MAR Hold] atorvastatin  40 mg Oral QHS  . [MAR Hold] Chlorhexidine Gluconate Cloth  6 each Topical Q0600  . [MAR Hold] collagenase   Topical Daily  . [MAR Hold] epoetin (EPOGEN/PROCRIT) injection  4,000 Units Intravenous Q T,Th,Sa-HD  . [MAR Hold] feeding supplement  1 Container Oral TID BM  . [MAR Hold] gabapentin  400 mg Oral TID  . [MAR Hold] heparin  5,000 Units Subcutaneous Q8H  . [MAR Hold] lipase/protease/amylase  24,000 Units Oral TID  . [MAR Hold] metoprolol tartrate  12.5 mg Oral BID  . [MAR Hold] multivitamin  1 tablet Oral QHS  . [MAR Hold] multivitamin-lutein  1 capsule Oral Daily  . [MAR Hold] nicotine  21 mg Transdermal Daily  . [MAR Hold] pantoprazole  40 mg Oral Daily  . [MAR Hold] polyethylene glycol-electrolytes  2,000  mL Oral Once  . [MAR Hold] senna-docusate  1 tablet Oral BID  . [MAR Hold] sertraline  50 mg Oral Daily  . [MAR Hold] traZODone  150 mg Oral QHS   Continuous Infusions: . sodium chloride    . dextrose 5 % and 0.9% NaCl 50 mL/hr at 04/29/20 0647   PRN Meds:.[MAR Hold] acetaminophen **OR** [MAR Hold] acetaminophen, [MAR Hold] albuterol, [MAR Hold] alum & mag hydroxide-simeth, [MAR Hold] diphenhydrAMINE, [MAR Hold] heparin, [MAR Hold] HYDROmorphone, [MAR Hold] midodrine, [MAR Hold] nitroGLYCERIN, [MAR Hold] ondansetron **OR** [MAR Hold] ondansetron (ZOFRAN) IV, [MAR Hold] promethazine   Assessment: Principal Problem:   Inadequate oral intake Active Problems:   Essential hypertension    Atrial fibrillation (HCC)   Chronic diastolic heart failure (HCC)   COPD with chronic bronchitis (HCC)   S/P CABG x 3   Diabetes mellitus ([place])   Chronic kidney disease (CKD), stage IV (severe) (HCC)   Weakness   Severe protein-calorie malnutrition (HCC)   Pressure injury, stage 4 (HCC)   ESRD on hemodialysis (HCC)   Weight loss, unintentional   UTI (urinary tract infection)   Infected wound   Failure to thrive in adult   Pressure injury of sacral region, stage 1    Plan: Proceed with EGD due to dysphagia, and weight loss  Proceed with colonoscopy due to weight loss and this was planned as an outpatient previously also  Assess for good window for feeding tube placement endoscopically during EGD today.  However, feeding tube will not be placed today as patient is also having a colonoscopy done today and both procedures need to be completed to rule out any underlying lesions or malignancy prior to feeding tube placement.  If good window for endoscopic feeding tube placement is present, repeat EGD tomorrow for feeding tube placement as air used for colon distention during colonoscopy today can increase risk of perforation during feeding tube placement.  Versus if feeding tube is placed tomorrow instead, the area used for colon Circulation today, will pass and allow for safer procedure tomorrow  I have discussed alternative options, risks & benefits,  which include, but are not limited to, bleeding, infection, perforation,respiratory complication & drug reaction.  The patient agrees with this plan & written consent will be obtained.      LOS: 10 days   Vonda Antigua, MD 04/30/2020, 10:05 AM

## 2020-04-30 NOTE — Progress Notes (Signed)
Hypoglycemic Event  CBG: 59  Treatment:dextrose (see MAR)  Symptoms: drowsy  Follow-up CBG: Time:1518 CBG Result:102  Possible Reasons for Event: not eating Comments/MD notified: Dr. [name] Locks notified that sugar was low and treated. Patient is still drowsy.    [name]

## 2020-05-01 ENCOUNTER — Encounter: Payer: Self-pay | Admitting: Gastroenterology

## 2020-05-01 DIAGNOSIS — R630 Anorexia: Secondary | ICD-10-CM

## 2020-05-01 LAB — CBC
HCT: 25.1 % — ABNORMAL LOW (ref 36.0–46.0)
Hemoglobin: 8.6 g/dL — ABNORMAL LOW (ref 12.0–15.0)
MCH: 35.4 pg — ABNORMAL HIGH (ref 26.0–34.0)
MCHC: 34.3 g/dL (ref 30.0–36.0)
MCV: 103.3 fL — ABNORMAL HIGH (ref 80.0–100.0)
Platelets: 130 10*3/uL — ABNORMAL LOW (ref 150–400)
RBC: 2.43 MIL/uL — ABNORMAL LOW (ref 3.87–5.11)
RDW: 16.3 % — ABNORMAL HIGH (ref 11.5–15.5)
WBC: 8.6 10*3/uL (ref 4.0–10.5)
nRBC: 0 % (ref 0.0–0.2)

## 2020-05-01 LAB — BASIC METABOLIC PANEL
Anion gap: 8 (ref 5–15)
BUN: 11 mg/dL (ref 8–23)
CO2: 24 mmol/L (ref 22–32)
Calcium: 7.6 mg/dL — ABNORMAL LOW (ref 8.9–10.3)
Chloride: 106 mmol/L (ref 98–111)
Creatinine, Ser: 2.27 mg/dL — ABNORMAL HIGH (ref 0.44–1.00)
GFR calc Af Amer: 25 mL/min — ABNORMAL LOW (ref >60)
GFR calc non Af Amer: 22 mL/min — ABNORMAL LOW (ref >60)
Glucose, Bld: 124 mg/dL — ABNORMAL HIGH (ref 70–99)
Potassium: 4.4 mmol/L (ref 3.5–5.1)
Sodium: 138 mmol/L (ref 135–145)

## 2020-05-01 LAB — PHOSPHORUS
Phosphorus: 4.4 mg/dL (ref 2.5–4.6)
Phosphorus: 1.2 mg/dL — ABNORMAL LOW (ref 2.5–4.6)

## 2020-05-01 LAB — MAGNESIUM: Magnesium: 2 mg/dL (ref 1.7–2.4)

## 2020-05-01 LAB — GLUCOSE, CAPILLARY
Glucose-Capillary: 102 mg/dL — ABNORMAL HIGH (ref 70–99)
Glucose-Capillary: 144 mg/dL — ABNORMAL HIGH (ref 70–99)
Glucose-Capillary: 118 mg/dL — ABNORMAL HIGH (ref 70–99)

## 2020-05-01 LAB — SURGICAL PATHOLOGY

## 2020-05-01 MED ORDER — METOCLOPRAMIDE HCL 5 MG PO TABS
5.0000 mg | ORAL_TABLET | Freq: Three times a day (TID) | ORAL | Status: DC
  Administered 2020-05-01 – 2020-05-02 (×2): 5 mg via ORAL
  Filled 2020-05-01 (×2): qty 1

## 2020-05-01 MED ORDER — ENSURE ENLIVE PO LIQD
237.0000 mL | Freq: Two times a day (BID) | ORAL | Status: DC
  Administered 2020-05-02: 237 mL via ORAL

## 2020-05-01 NOTE — Progress Notes (Signed)
[facility], [place] 05/01/20  Subjective:   LOS: 11 Patient had EGD and colonoscopy yesterday.  Found to have esophagitis and esophageal ulcers yesterday.  Gastric ulcer also found.     Objective:  Vital signs in last 24 hours:  Temp:  [97.4 F (36.3 C)-100.1 F (37.8 C)] 98.3 F (36.8 C) (08/26 0537) Pulse Rate:  [81-100] 81 (08/26 0537) Resp:  [12-20] 16 (08/26 0537) BP: (86-119)/(39-75) 119/54 (08/26 0537) SpO2:  [93 %-100 %] 97 % (08/26 0537) Weight:  [42.9 kg] 42.9 kg (08/26 0500)  Weight change: -0.7 kg Filed Weights   04/20/20 2033 04/30/20 0500 05/01/20 0500  Weight: 36.7 kg 43.6 kg 42.9 kg    Intake/Output:    Intake/Output Summary (Last 24 hours) at 05/01/2020 1101 Last data filed at 05/01/2020 0900 Gross per 24 hour  Intake 1306.02 ml  Output --  Net 1306.02 ml    Physical Exam: General:  In no acute distress, lethargic  HEENT  normocephalic atraumatic, hearing intact  Pulm/lungs  Lungs clear, normal effort  CVS/Heart  S1S2, no rub or gallop  Abdomen:   Soft, nontender  Extremities:  No peripheral edema   Neurologic:  Alert, oriented  Skin:  Chronic decubitus ulcers +  Left arm AV fistula    Basic Metabolic Panel:  Recent Labs  Lab 04/27/20 0531 04/27/20 0531 04/27/20 2143 04/28/20 0338 04/28/20 0338 04/29/20 0418 04/30/20 0731 05/01/20 0526  NA 142  --   --  136  --  136 136 138  K 3.0*   < > 4.8 5.0  --  4.5 4.7 4.4  CL 107  --   --  102  --  99 102 106  CO2 27  --   --  25  --  26 26 24   GLUCOSE 637*  --   --  125*  --  134* 67* 124*  BUN 10  --   --  12  --  14 10 11   CREATININE 1.82*  --   --  2.09*  --  2.19* 1.88* 2.27*  CALCIUM 6.7*   < >  --  7.7*   < > 7.9* 7.8* 7.6*  MG 1.8  --   --  2.1  --  2.0 1.6* 2.0  PHOS 2.4*   < > 4.9* 5.2*  --  4.8* 3.8 4.4   < > = values in this interval not displayed.     CBC: Recent Labs  Lab 04/27/20 0531 04/28/20 0338 04/29/20 0418 04/30/20 0731  05/01/20 0526  WBC 6.7 7.6 13.3* 10.9* 8.6  HGB 8.9* 8.7* 11.2* 8.9* 8.6*  HCT 27.1* 26.2* 31.7* 26.4* 25.1*  MCV 106.3* 105.2* 101.3* 105.2* 103.3*  PLT 133* 147* 192 147* 130*      Lab Results  Component Value Date   HEPBSAG NON REACTIVE 04/22/2020   HEPBSAB Non Reactive 07/27/2018      Microbiology:  No results found for this or any previous visit (from the past 240 hour(s)).  Coagulation Studies: No results for input(s): LABPROT, INR in the last 72 hours.  Urinalysis: No results for input(s): COLORURINE, LABSPEC, PHURINE, GLUCOSEU, HGBUR, BILIRUBINUR, KETONESUR, PROTEINUR, UROBILINOGEN, NITRITE, LEUKOCYTESUR in the last 72 hours.  Invalid input(s): APPERANCEUR    Imaging: CT ABDOMEN PELVIS WO CONTRAST  Result Date: 04/30/2020 CLINICAL DATA:  69 year old female with history of abdominal aortic aneurysm. Postoperative study. EXAM: CT ABDOMEN AND PELVIS WITHOUT CONTRAST TECHNIQUE: Multidetector CT imaging of the abdomen and pelvis was performed  following the standard protocol without IV contrast. COMPARISON:  CT of the pelvis 04/19/2020. CT the abdomen and pelvis 10/21/2019. FINDINGS: Lower chest: Small bilateral pleural effusions lying dependently with areas of passive subsegmental atelectasis in the lower lobes of the lungs bilaterally. Atherosclerotic calcifications in the descending thoracic aorta as well as the left anterior descending and right coronary arteries. Calcifications of the mitral annulus. Median sternotomy wires. Hepatobiliary: No definite suspicious cystic or solid hepatic lesions are identified on today's noncontrast CT examination. Status post cholecystectomy. Pancreas: No definite pancreatic mass or peripancreatic fluid collections or inflammatory changes are noted on today's noncontrast CT examination. Spleen: Unremarkable. Adrenals/Urinary Tract: Low-attenuation lesion in the posterior aspect of the interpolar region of the left kidney, incompletely  characterized on today's noncontrast CT examination. Right kidney and bilateral adrenal glands are unremarkable in appearance. No hydroureteronephrosis. Urinary bladder is normal in appearance. Stomach/Bowel: Unenhanced appearance of the stomach is unremarkable. No pathologic dilatation of small bowel or colon. Suture line near the rectosigmoid junction from prior partial colectomy. The appendix is not confidently identified and may be surgically absent. Regardless, there are no inflammatory changes noted adjacent to the cecum to suggest the presence of an acute appendicitis at this time. Vascular/Lymphatic: Aortic atherosclerosis with persistent aneurysmal dilatation of the infrarenal abdominal aorta which measures up to 3.9 x 3.8 cm (axial image 42 of series 2), similar to prior study from 10/26/2019. Aorto bi-iliac graft again noted. Bilateral renal artery stents are also noted. No lymphadenopathy noted in the abdomen or pelvis. Reproductive: Status post hysterectomy. Ovaries are not confidently identified may be surgically absent or atrophic. Other: No significant volume of ascites.  No pneumoperitoneum. Musculoskeletal: Diffuse body wall edema. There are no aggressive appearing lytic or blastic lesions noted in the visualized portions of the skeleton. IMPRESSION: 1. Aorto bi-iliac bypass graft in position with persistent aneurysmal dilatation of the infrarenal abdominal aorta measuring 3.9 x 3.8 cm, similar to prior study from 10/26/2019. 2. Small bilateral pleural effusions lying dependently with areas of passive subsegmental atelectasis in the lower lobes of the lungs bilaterally. 3. Diffuse body wall edema. 4. Aortic atherosclerosis, in addition to 2 vessel coronary artery disease. Please note that although the presence of coronary artery calcium documents the presence of coronary artery disease, the severity of this disease and any potential stenosis cannot be assessed on this non-gated CT examination.  Assessment for potential risk factor modification, dietary therapy or pharmacologic therapy may be warranted, if clinically indicated. 5. Additional incidental findings, as above. Electronically Signed   By: Vinnie Langton M.D.   On: 04/30/2020 15:22     Medications:   . dextrose 5 % and 0.9% NaCl 50 mL/hr at 05/01/20 0413   . (feeding supplement) PROSource Plus  30 mL Oral TID BM  . allopurinol  100 mg Oral QHS  . amitriptyline  10 mg Oral QHS  . vitamin C  500 mg Oral BID  . atorvastatin  40 mg Oral QHS  . collagenase   Topical Daily  . epoetin (EPOGEN/PROCRIT) injection  4,000 Units Intravenous Q T,Th,Sa-HD  . feeding supplement  1 Container Oral TID BM  . gabapentin  400 mg Oral TID  . heparin  5,000 Units Subcutaneous Q8H  . lipase/protease/amylase  24,000 Units Oral TID  . megestrol  400 mg Oral Daily  . metoprolol tartrate  12.5 mg Oral BID  . multivitamin  1 tablet Oral QHS  . multivitamin-lutein  1 capsule Oral Daily  . nicotine  21  mg Transdermal Daily  . pantoprazole (PROTONIX) IV  40 mg Intravenous Q12H  . polyethylene glycol-electrolytes  2,000 mL Oral Once  . senna-docusate  1 tablet Oral BID  . sertraline  50 mg Oral Daily  . traZODone  150 mg Oral QHS   acetaminophen **OR** acetaminophen, albuterol, alum & mag hydroxide-simeth, diphenhydrAMINE, heparin, HYDROmorphone, midodrine, nitroGLYCERIN, ondansetron **OR** ondansetron (ZOFRAN) IV, promethazine  Assessment/ Plan:  69 y.o. female with medical problems of ESRD, on hemodialysis, CAD/CABG 03/2011, COPD, atrial fibrillation, hypertension, COPD, chronic pancreatitis and chronic abdominal pain requiring recurrent hospitalizations , h/o AAA, cholecystectomy, severe peripheral arterial disease including bilateral renal artery stent, subclavian artery stenosis, left, left CEA  was admitted on 04/19/2020 for generalized weakness and pain.  Principal Problem:   Inadequate oral intake Active Problems:   Essential  hypertension   Atrial fibrillation (HCC)   Chronic diastolic heart failure (HCC)   COPD with chronic bronchitis (HCC)   S/P CABG x 3   Diabetes mellitus (HCC)   Chronic kidney disease (CKD), stage IV (severe) (HCC)   Weakness   Severe protein-calorie malnutrition (HCC)   Pressure injury, stage 4 (HCC)   ESRD on hemodialysis (HCC)   Weight loss, unintentional   UTI (urinary tract infection)   Infected wound   Failure to thrive in adult   Pressure injury of sacral region, stage 1   Esophageal dysphagia   Acute esophagitis   Acute gastric ulcer without hemorrhage or perforation   Polyp of ascending colon   Lipoma of colon  Infected wound [T14.8XXA, L08.9] Weakness [R53.1] Failure to thrive in adult [R62.7] Inadequate oral intake [R63.8] Acute cystitis with hematuria [N30.01] Wound of right buttock, initial encounter [S31.819A] Pressure injury of sacral region, stage 1 [facility]/TTS/left arm AV fistula/CK [place]  #. ESRD Patient due for hemodialysis treatment today.  Orders have been prepared.  #. Anemia of CKD  Lab Results  Component Value Date   HGB 8.6 (L) 05/01/2020   Administer Epogen 4000 units IV with dialysis today.  #. Secondary hyperparathyroidism of renal origin N 25.81      Component Value Date/Time   PTH 80 (H) 08/15/2018 1435   Lab Results  Component Value Date   PHOS 4.4 05/01/2020  Phosphorus currently at target.  Not currently on binders.    #.  Rt Buttock wound S/p I & D rt ischial decub ulcer,managing by the wound care team May be contributed by severe protein calorie malnutrition Hopefully once her nutrition improves her wounds can heal. #Urinary tract infection E. coli and Enterococcus faecalis, treated with antibiotics.   #Malnutrition Primary team considering PEG placement.    LOS: 11 [name] 8/26/202111:01 AM  [facility] [place], [place]

## 2020-05-01 NOTE — NC FL2 (Signed)
[place] LEVEL OF CARE SCREENING TOOL     IDENTIFICATION  Patient Name: [name] Birthdate: 1951-04-18 Sex: female Admission Date (Current Location): 04/19/2020  [place] and [place] Number:  Engineering geologist and Address:  [facility], [address], [place], [place] [address]      Provider Number: [id number]  Attending Physician Name and Address:  Sharen Hones, MD  Relative Name and Phone Number:  [name] [phone]    Current Level of Care: Hospital Recommended Level of Care: [place] Prior Approval Number:    Date Approved/Denied:   PASRR Number: [id number]  Discharge Plan: SNF    Current Diagnoses: Patient Active Problem List   Diagnosis Date Noted  . Esophageal dysphagia   . Acute esophagitis   . Acute gastric ulcer without hemorrhage or perforation   . Polyp of ascending colon   . Lipoma of colon   . Pressure injury of sacral region, stage 1   . Failure to thrive in adult   . Inadequate oral intake 04/20/2020  . UTI (urinary tract infection) 04/20/2020  . Infected wound 04/20/2020  . Weight loss, unintentional 02/06/2020  . Generalized abdominal pain 02/06/2020  . Idiopathic chronic pancreatitis ([place]) 02/06/2020  . AV fistula occlusion ([name]) 01/22/2020  . AV fistula thrombosis, initial encounter ([place]) 01/19/2020  . AF (paroxysmal atrial fibrillation) ([place]) 01/19/2020  . Chronic respiratory failure with hypoxia ([place]) 01/19/2020  . Acute pancreatitis 07/26/2019  . Acute on chronic pancreatitis ([place]) 05/12/2019  . Abnormal CT of the abdomen 09/25/2018  . Pancreatic divisum 09/25/2018  . Pancreatic mass   . ESRD on hemodialysis ([place]) 09/09/2018  . Renal failure (ARF), acute on chronic (HCC) 08/15/2018  . ARF (acute renal failure) ([place]) 08/15/2018  . CKD (chronic kidney disease), stage V ([place]) 08/15/2018  . Hypomagnesemia 08/15/2018  . Acute pulmonary edema ([place]) 08/14/2018  . Goals of  care, counseling/discussion   . DNR (do not resuscitate) discussion   . AKI (acute kidney injury) ([place]) 07/26/2018  . Acute encephalopathy 07/24/2018  . Chronic pain 07/24/2018  . Malnutrition of moderate degree 07/13/2018  . Stage II pressure ulcer ([place]) 07/12/2018  . Fluid collection at surgical site   . Intraabdominal fluid collection   . Cystic duct calculus   . RUQ pain 07/08/2018  . Peripancreatic fluid collection 07/04/2018  . 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 Urinary retention 07/04/2018  . S/P insertion of iliac artery stent 07/04/2018  . Endoleak post (EVAR) endovascular aneurysm repair ([place]) 07/04/2018  . Physical deconditioning   . Esophageal ulcer with bleeding   . Pressure injury, stage 4 ([place]) 06/16/2018  . Hematemesis   . Palliative care patient   . Palliative care by specialist   . Anasarca 06/07/2018  . Demand ischemia ([place]) 06/07/2018  . Severe protein-calorie malnutrition ([place]) 05/29/2018  . Type II diabetes mellitus with renal manifestations ([place]) 05/28/2018  . Recurrent pancreatitis 05/28/2018  . Hypokalemia 05/28/2018  . SIRS (systemic inflammatory response syndrome) ([place]) 05/28/2018  . Acute recurrent pancreatitis 05/28/2018  . Volume overload 04/17/2018  . Anemia due to GI blood loss 04/14/2018  . Acute gallstone pancreatitis 04/14/2018  . Acute cholecystitis 04/14/2018  . Gastrointestinal hemorrhage   . Weakness 04/09/2018  . Hypoglycemia 01/19/2018  . Type II diabetes mellitus ([place]) 01/19/2018  . Nausea with vomiting 01/19/2018  . Uncontrolled hypertension 01/19/2018  . Hyperlipidemia 01/19/2018  . CAD (coronary artery disease) 01/19/2018  . Chronic kidney disease (CKD), stage IV (severe) (  [place]) 01/19/2018  . Retroperitoneal hematoma 01/19/2018  . Acute blood loss anemia 01/19/2018  . Chronic diastolic congestive heart failure ([place]) 01/19/2018  . AAA (abdominal aortic aneurysm) ([place]) 01/16/2018  . Elevated brain natriuretic peptide (BNP)  level 10/07/2015  . Diabetes mellitus ([place]) 10/07/2015  . Pain in the chest   . PAD (peripheral artery disease) ([place]) 07/29/2015  . Chest pain 07/04/2015  . Acute renal failure superimposed on stage 4 chronic kidney disease ([place]) 07/04/2015  . GERD (gastroesophageal reflux disease) 07/04/2015  . Anemia 06/26/2015  . Coronary artery disease involving autologous vein coronary bypass graft with unstable angina pectoris ([place]) 06/24/2015  . Angina at rest [facility])   . Subclavian artery stenosis, left ([place]) 06/23/2015  . AAA (abdominal aortic aneurysm) without rupture ([place]) 12/10/2014  . Subclavian steal syndrome 12/10/2014  . Renal artery stenosis ([place]) 09/04/2013  . Carotid artery stenosis 09/28/2012  . Mitral valve disorder 06/23/2011  . S/P CABG x 3 06/16/2011  . S/P Maze operation for atrial fibrillation 06/16/2011  . S/P MVR (mitral valve repair) 06/16/2011  . Follow-up examination following surgery 03/29/2011  . CKD (chronic kidney disease) 03/03/2011  . COPD with chronic bronchitis ([place]) 03/03/2011  . Chronic diastolic heart failure ([place]) 02/26/2011  . Hypotension 02/26/2011  . NSTEMI (non-ST elevated myocardial infarction) ([place]) 02/26/2011  . Long term (current) use of anticoagulants 12/17/2010  . OBSTRUCTIVE SLEEP APNEA 09/04/2010  . HYPOXEMIA 09/04/2010  . SNORING 08/05/2010  . Atrial fibrillation ([place]) 12/09/2009  . WEIGHT LOSS 03/28/2009  . PALPITATIONS 03/28/2009  . PVD 11/21/2008  . HYPERLIPIDEMIA-MIXED 11/20/2008  . TOBACCO ABUSE 11/20/2008  . Essential hypertension 11/20/2008  . Coronary artery disease involving native heart with angina pectoris ([place]) 11/20/2008  . ABDOMINAL AORTIC ANEURYSM 11/20/2008    Orientation RESPIRATION BLADDER Height & Weight     Self, Time, Situation, Place  Normal Continent Weight: 94 lb 9.2 oz (42.9 kg) Height:  5\' 5"  (165.1 cm)  BEHAVIORAL SYMPTOMS/MOOD NEUROLOGICAL BOWEL NUTRITION STATUS   (None)  (None) Continent Diet (Regular)   AMBULATORY STATUS COMMUNICATION OF NEEDS Skin   Limited Assist Verbally Bruising, Other (Comment) (Unstageable pressure injury on right buttocks (gauze, ABD, and foam daily) and right ankle (foam and xeroform BID).)                       Personal Care Assistance Level of Assistance  Bathing, Feeding, Dressing Bathing Assistance: Limited assistance Feeding assistance: Limited assistance Dressing Assistance: Limited assistance    Functional Limitations Info  Sight, Hearing, Speech Sight Info: Adequate Hearing Info: Adequate Speech Info: Adequate    SPECIAL CARE FACTORS FREQUENCY  PT (By licensed PT), OT (By licensed OT)     PT Frequency: 5 x week OT Frequency: 5 x week            Contractures Contractures Info: Not present    Additional Factors Info  Code Status, Allergies Code Status Info: Full code Allergies Info: Isosorbide, Codeine, Contrast Media (Iodinated Diagnostic Agents), Ioxaglate, Isosorbide Nitrate, Metrizamide, Oxycodone-acetaminophen, Percocet (Oxycodone-acetaminophen)           Current Medications (05/01/2020):  This is the current hospital active medication list Current Facility-Administered Medications  Medication Dose Route Frequency Provider Last Rate Last Admin  . (feeding supplement) PROSource Plus liquid 30 mL  30 mL Oral TID BM Sreenath, Sudheer B, MD   30 mL at 05/01/20 0900  . acetaminophen (TYLENOL) tablet 650 mg  650 mg Oral Q6H PRN Athena Masse,  MD   650 mg at 04/30/20 2108   Or  . acetaminophen (TYLENOL) suppository 650 mg  650 mg Rectal Q6H PRN Athena Masse, MD      . albuterol (PROVENTIL) (2.5 MG/3ML) 0.083% nebulizer solution 3 mL  3 mL Inhalation Q6H PRN Sreenath, Sudheer B, MD      . allopurinol (ZYLOPRIM) tablet 100 mg  100 mg Oral QHS Ralene Muskrat B, MD   100 mg at 04/30/20 2108  . alum & mag hydroxide-simeth (MAALOX/MYLANTA) 200-200-20 MG/5ML suspension 15 mL  15 mL Oral Q4H PRN Ralene Muskrat B, MD   15 mL at  04/26/20 2141  . amitriptyline (ELAVIL) tablet 10 mg  10 mg Oral QHS Enzo Bi, MD   10 mg at 04/30/20 2110  . ascorbic acid (VITAMIN C) tablet 500 mg  500 mg Oral BID Ralene Muskrat B, MD   500 mg at 05/01/20 0853  . atorvastatin (LIPITOR) tablet 40 mg  40 mg Oral QHS Ralene Muskrat B, MD   40 mg at 04/30/20 2108  . collagenase (SANTYL) ointment   Topical Daily Ronny Bacon, MD   Given at 04/29/20 1653  . dextrose 5 %-0.9 % sodium chloride infusion   Intravenous Continuous Enzo Bi, MD 50 mL/hr at 05/01/20 0413 Rate Verify at 05/01/20 0413  . diphenhydrAMINE (BENADRYL) capsule 50 mg  50 mg Oral QHS PRN Sharion Settler, NP   50 mg at 04/30/20 2108  . epoetin alfa (EPOGEN) injection 4,000 Units  4,000 Units Intravenous Q T,Th,Sa-HD Murlean Iba, MD   4,000 Units at 05/01/20 1230  . feeding supplement (BOOST / RESOURCE BREEZE) liquid 1 Container  1 Container Oral TID BM Sidney Ace, MD   1 Container at 05/01/20 [phone]  . feeding supplement (ENSURE ENLIVE) (ENSURE ENLIVE) liquid 237 mL  237 mL Oral BID BM Sharen Hones, MD      . gabapentin (NEURONTIN) capsule 400 mg  400 mg Oral TID Enzo Bi, MD   400 mg at 05/01/20 0853  . heparin injection 5,000 Units  5,000 Units Subcutaneous Q8H Athena Masse, MD   5,000 Units at 05/01/20 0522  . heparin injection 700 Units  20 Units/kg Dialysis PRN Murlean Iba, MD      . HYDROmorphone (DILAUDID) tablet 4 mg  4 mg Oral Q4H PRN Enzo Bi, MD   4 mg at 05/01/20 0856  . lipase/protease/amylase (CREON) capsule 24,000 Units  24,000 Units Oral TID Ralene Muskrat B, MD   24,000 Units at 05/01/20 [phone]  . megestrol (MEGACE) 400 MG/10ML suspension 400 mg  400 mg Oral Daily Sharen Hones, MD   400 mg at 04/30/20 1723  . metoCLOPramide (REGLAN) tablet 5 mg  5 mg Oral TID AC Zhang, Dekui, MD      . metoprolol tartrate (LOPRESSOR) tablet 12.5 mg  12.5 mg Oral BID Ralene Muskrat B, MD   12.5 mg at 04/29/20 2054  . midodrine (PROAMATINE) tablet 10  mg  10 mg Oral Q dialysis Sreenath, Sudheer B, MD      . multivitamin (RENA-VIT) tablet 1 tablet  1 tablet Oral QHS Ralene Muskrat B, MD   1 tablet at 04/30/20 2108  . multivitamin-lutein (OCUVITE-LUTEIN) capsule 1 capsule  1 capsule Oral Daily Ralene Muskrat B, MD   1 capsule at 04/28/20 1338  . nicotine (NICODERM CQ - dosed in mg/24 hours) patch 21 mg  21 mg Transdermal Daily Ralene Muskrat B, MD   21 mg at 05/01/20 0852  . nitroGLYCERIN (NITROSTAT)  SL tablet 0.4 mg  0.4 mg Sublingual Q5 min PRN Ralene Muskrat B, MD      . ondansetron (ZOFRAN) tablet 4 mg  4 mg Oral Q6H PRN Athena Masse, MD       Or  . ondansetron [facility]) injection 4 mg  4 mg Intravenous Q6H PRN Athena Masse, MD   4 mg at 04/28/20 2001  . pantoprazole (PROTONIX) injection 40 mg  40 mg Intravenous Q12H Vonda Antigua B, MD   40 mg at 05/01/20 0853  . polyethylene glycol-electrolytes (NuLYTELY) solution 2,000 mL  2,000 mL Oral Once Vonda Antigua B, MD      . promethazine (PHENERGAN) tablet 12.5 mg  12.5 mg Oral Q4H PRN Priscella Mann, Sudheer B, MD      . senna-docusate (Senokot-S) tablet 1 tablet  1 tablet Oral BID Enzo Bi, MD   1 tablet at 05/01/20 [phone]  . sertraline (ZOLOFT) tablet 50 mg  50 mg Oral Daily Ralene Muskrat B, MD   50 mg at 05/01/20 0853  . traZODone (DESYREL) tablet 150 mg  150 mg Oral QHS Ralene Muskrat B, MD   150 mg at 04/30/20 2108     Discharge Medications: Please see discharge summary for a list of discharge medications.  Relevant Imaging Results:  Relevant Lab Results:   Additional Information SS#: [id number]. HD [place] TTS 11:20 am.  Candie Chroman, LCSW

## 2020-05-01 NOTE — Progress Notes (Signed)
Vonda Antigua, MD [address] [place], [place], [place], [address] [address], [place], [place], [place], [address] Phone: [phone]  Fax: [phone]   Subjective:  Patient seen in dialysis today.  Denies abdominal pain.  Objective: Exam: Vital signs in last 24 hours: Vitals:   05/01/20 1300 05/01/20 1315 05/01/20 1336 05/01/20 1340  BP: (!) 134/53 (!) 124/45 (!) 127/52 117/61  Pulse: 85 87 90 99  Resp:    18  Temp:    98.4 F (36.9 C)  TempSrc:    Oral  SpO2:    96%  Weight:      Height:       Weight change: -0.7 kg  Intake/Output Summary (Last 24 hours) at 05/01/2020 1442 Last data filed at 05/01/2020 1340 Gross per 24 hour  Intake 1006.02 ml  Output -500 ml  Net 1506.02 ml    General: No acute distress, AAO x3 Abd: Soft, NT/ND, No HSM Skin: Warm, no rashes Neck: Supple, Trachea midline   Lab Results: Lab Results  Component Value Date   WBC 8.6 05/01/2020   HGB 8.6 (L) 05/01/2020   HCT 25.1 (L) 05/01/2020   MCV 103.3 (H) 05/01/2020   PLT 130 (L) 05/01/2020   Micro Results: No results found for this or any previous visit (from the past 240 hour(s)). Studies/Results: CT ABDOMEN PELVIS WO CONTRAST  Result Date: 04/30/2020 CLINICAL DATA:  69 year old female with history of abdominal aortic aneurysm. Postoperative study. EXAM: CT ABDOMEN AND PELVIS WITHOUT CONTRAST TECHNIQUE: Multidetector CT imaging of the abdomen and pelvis was performed following the standard protocol without IV contrast. COMPARISON:  CT of the pelvis 04/19/2020. CT the abdomen and pelvis 10/21/2019. FINDINGS: Lower chest: Small bilateral pleural effusions lying dependently with areas of passive subsegmental atelectasis in the lower lobes of the lungs bilaterally. Atherosclerotic calcifications in the descending thoracic aorta as well as the left anterior descending and right coronary arteries. Calcifications of the mitral annulus. Median sternotomy wires. Hepatobiliary: No  definite suspicious cystic or solid hepatic lesions are identified on today's noncontrast CT examination. Status post cholecystectomy. Pancreas: No definite pancreatic mass or peripancreatic fluid collections or inflammatory changes are noted on today's noncontrast CT examination. Spleen: Unremarkable. Adrenals/Urinary Tract: Low-attenuation lesion in the posterior aspect of the interpolar region of the left kidney, incompletely characterized on today's noncontrast CT examination. Right kidney and bilateral adrenal glands are unremarkable in appearance. No hydroureteronephrosis. Urinary bladder is normal in appearance. Stomach/Bowel: Unenhanced appearance of the stomach is unremarkable. No pathologic dilatation of small bowel or colon. Suture line near the rectosigmoid junction from prior partial colectomy. The appendix is not confidently identified and may be surgically absent. Regardless, there are no inflammatory changes noted adjacent to the cecum to suggest the presence of an acute appendicitis at this time. Vascular/Lymphatic: Aortic atherosclerosis with persistent aneurysmal dilatation of the infrarenal abdominal aorta which measures up to 3.9 x 3.8 cm (axial image 42 of series 2), similar to prior study from 10/26/2019. Aorto bi-iliac graft again noted. Bilateral renal artery stents are also noted. No lymphadenopathy noted in the abdomen or pelvis. Reproductive: Status post hysterectomy. Ovaries are not confidently identified may be surgically absent or atrophic. Other: No significant volume of ascites.  No pneumoperitoneum. Musculoskeletal: Diffuse body wall edema. There are no aggressive appearing lytic or blastic lesions noted in the visualized portions of the skeleton. IMPRESSION: 1. Aorto bi-iliac bypass graft in position with persistent aneurysmal dilatation of the infrarenal abdominal aorta measuring 3.9 x 3.8 cm,  similar to prior study from 10/26/2019. 2. Small bilateral pleural effusions lying  dependently with areas of passive subsegmental atelectasis in the lower lobes of the lungs bilaterally. 3. Diffuse body wall edema. 4. Aortic atherosclerosis, in addition to 2 vessel coronary artery disease. Please note that although the presence of coronary artery calcium documents the presence of coronary artery disease, the severity of this disease and any potential stenosis cannot be assessed on this non-gated CT examination. Assessment for potential risk factor modification, dietary therapy or pharmacologic therapy may be warranted, if clinically indicated. 5. Additional incidental findings, as above. Electronically Signed   By: Vinnie Langton M.D.   On: 04/30/2020 15:22   Medications:  Scheduled Meds: . (feeding supplement) PROSource Plus  30 mL Oral TID BM  . allopurinol  100 mg Oral QHS  . amitriptyline  10 mg Oral QHS  . vitamin C  500 mg Oral BID  . atorvastatin  40 mg Oral QHS  . collagenase   Topical Daily  . epoetin (EPOGEN/PROCRIT) injection  4,000 Units Intravenous Q T,Th,Sa-HD  . feeding supplement  1 Container Oral TID BM  . feeding supplement (ENSURE ENLIVE)  237 mL Oral BID BM  . gabapentin  400 mg Oral TID  . heparin  5,000 Units Subcutaneous Q8H  . lipase/protease/amylase  24,000 Units Oral TID  . megestrol  400 mg Oral Daily  . metoCLOPramide  5 mg Oral TID AC  . metoprolol tartrate  12.5 mg Oral BID  . multivitamin  1 tablet Oral QHS  . multivitamin-lutein  1 capsule Oral Daily  . nicotine  21 mg Transdermal Daily  . pantoprazole (PROTONIX) IV  40 mg Intravenous Q12H  . polyethylene glycol-electrolytes  2,000 mL Oral Once  . senna-docusate  1 tablet Oral BID  . sertraline  50 mg Oral Daily  . traZODone  150 mg Oral QHS   Continuous Infusions: . dextrose 5 % and 0.9% NaCl 50 mL/hr at 05/01/20 0413   PRN Meds:.acetaminophen **OR** acetaminophen, albuterol, alum & mag hydroxide-simeth, diphenhydrAMINE, heparin, HYDROmorphone, midodrine, nitroGLYCERIN, ondansetron  **OR** ondansetron (ZOFRAN) IV, promethazine   Assessment: Principal Problem:   Inadequate oral intake Active Problems:   Essential hypertension   Atrial fibrillation (HCC)   Chronic diastolic heart failure (HCC)   COPD with chronic bronchitis (HCC)   S/P CABG x 3   Diabetes mellitus (HCC)   Chronic kidney disease (CKD), stage IV (severe) (HCC)   Weakness   Severe protein-calorie malnutrition (HCC)   Pressure injury, stage 4 (HCC)   ESRD on hemodialysis (HCC)   Weight loss, unintentional   UTI (urinary tract infection)   Infected wound   Failure to thrive in adult   Pressure injury of sacral region, stage 1   Esophageal dysphagia   Acute esophagitis   Acute gastric ulcer without hemorrhage or perforation   Polyp of ascending colon   Lipoma of colon    Plan: Given large gastric ulcer seen on upper endoscopy yesterday, would not recommend feeding tube placement at this time as it would really increase the risk of infection, bleeding in the setting of underlying gastric ulcer  In addition, patient is able to eat and should be encouraged to eat small frequent meals to maintain caloric intake  Etiology of poor appetite should be evaluated further, including ruling out anxiety or depression as cause of poor appetite.  If still unable to maintain oral caloric intake, would recommend Dobbhoff tube feeds with Dobbhoff to be placed fluoroscopically until feeding  tube can be done at a later time  Would recommend aggressive management of esophagitis and gastric ulcer with full dose PPI twice daily  Protonix 40 IV twice daily as an inpatient and p.o. twice daily as an outpatient for 8 to 12 weeks  H pylori ordered   LOS: 11 days   Vonda Antigua, MD 05/01/2020, 2:42 PM

## 2020-05-01 NOTE — Progress Notes (Addendum)
PROGRESS NOTE    [name]  DTO:[id number] DOB: 1950-10-11 DOA: 04/19/2020 PCP: Bernerd Limbo, MD   Chief complaint.  Anorexia.  Brief Narrative:  Briefly this is a 69 year old Caucasian female with extensive past medical history of ESRD on hemodialysis TTS, CAD status post CABG, status post mitral valve repair, atrial fibrillation on anticoagulation, chronic diastolic congestive heart failure, PAD, bilateral renal artery stenosis, severe protein calorie malnutrition, chronic pancreatitis, chronic narcotic use, COPD, hypertension, depression.  She presented to the emergency department yesterday with weakness, generalized pain, acute on chronic pain of the right buttock ulcer. The patient's been followed by wound care clinic. However the wound had worsened and pain worsened to the point that the patient presented to the emergency department. She is also markedly malnourished weight has dropped to 37.6 kg. BMI 13.8.  8/26.  EGD was performed yesterday showed diffuse esophagitis and gastric ulcer.  Colonoscopy showed multiple colonic polyps, polypectomy performed.    Assessment & Plan:   Principal Problem:   Inadequate oral intake Active Problems:   Essential hypertension   Atrial fibrillation (HCC)   Chronic diastolic heart failure (HCC)   COPD with chronic bronchitis (HCC)   S/P CABG x 3   Diabetes mellitus (HCC)   Chronic kidney disease (CKD), stage IV (severe) (HCC)   Weakness   Severe protein-calorie malnutrition (HCC)   Pressure injury, stage 4 (HCC)   ESRD on hemodialysis (HCC)   Weight loss, unintentional   UTI (urinary tract infection)   Infected wound   Failure to thrive in adult   Pressure injury of sacral region, stage 1   Esophageal dysphagia   Acute esophagitis   Acute gastric ulcer without hemorrhage or perforation   Polyp of ascending colon   Lipoma of colon  #1.  Anorexia, severe protein calorie malnutrition adult failure to thrive. Reviewed EGD  and colonoscopy results.  Patient has diffuse esophagitis, also has gastric ulcer.  Multiple colon polyps. EGD also showed moderate amount of food debris given patient was n.p.o. overnight. Discussed with Dr.Tahiliani, her opinion that PEG tube is contraindicated given patient gastric ulcer. I will give a trial of megestrol.  If not effective, will place Dobbhoff.  Also start nutrition supplements.  2.  Infected right buttock wound, right ankle wound stage II. Finished antibiotics.  Followed by wound care.  3.  Type 2 diabetes with hypoglycemia.  Secondary to poor p.o. intake. Currently on D5.  4.  End-stage renal disease on hemodialysis. Followed by nephrology.  5.  Urinary tract infection. Completed 5 days antibiotics.  6.  Atrial fibrillation. Not on anticoagulation  7.  Chronic diastolic congestive heart failure. Low volume overload.  8.  Anemia of chronic disease. Follow.    DVT prophylaxis: Heparin Code Status: Full Family Communication:  Discussed with patient daughter, all questions answered. Disposition Plan:   Patient came from: Home  Anticipated d/c place: SNF  Barriers to d/c OR conditions which need to be met to effect a safe d/c:   Consultants:   GI  Procedures:EGD and colonoscopy Antimicrobials: None   Subjective: Patient slept well last night.  She looks refreshed today.  However, her appetite is still very poor.  She had very little p.o. intake. Denies any short of breath or cough. No fever or chills.  Objective: Vitals:   04/30/20 2039 04/30/20 2208 05/01/20 0500 05/01/20 0537  BP: (!) 109/39   (!) 119/54  Pulse: 88   81  Resp: 20   16  Temp: 100.1 F (37.8 C) 98.9 F (37.2 C)  98.3 F (36.8 C)  TempSrc: Oral Oral  Oral  SpO2: 93%   97%  Weight:   42.9 kg   Height:        Intake/Output Summary (Last 24  hours) at 05/01/2020 1216 Last data filed at 05/01/2020 0900 Gross per 24 hour  Intake 1006.02 ml  Output --  Net 1006.02 ml   Filed Weights   04/20/20 2033 04/30/20 0500 05/01/20 0500  Weight: 36.7 kg 43.6 kg 42.9 kg    Examination:  General exam: Appears calm and comfortable, ill-appearing. Respiratory system: Clear to auscultation. Respiratory effort normal. Cardiovascular system: S1 & S2 heard, RRR. No JVD, murmurs, rubs, gallops or clicks. No pedal edema. Gastrointestinal system: Abdomen is nondistended, soft and nontender. No organomegaly or masses felt. Normal bowel sounds heard. Central nervous system: Alert and oriented x3. No focal neurological deficits. Extremities: Symmetric . Skin: No rashes, lesions or ulcers Psychiatry:  Mood & affect appropriate.     Data Reviewed: I have personally reviewed following labs and imaging studies  CBC: Recent Labs  Lab 04/27/20 0531 04/28/20 0338 04/29/20 0418 04/30/20 0731 05/01/20 0526  WBC 6.7 7.6 13.3* 10.9* 8.6  HGB 8.9* 8.7* 11.2* 8.9* 8.6*  HCT 27.1* 26.2* 31.7* 26.4* 25.1*  MCV 106.3* 105.2* 101.3* 105.2* 103.3*  PLT 133* 147* 192 147* 570*   Basic Metabolic Panel: Recent Labs  Lab 04/27/20 0531 04/27/20 0531 04/27/20 2143 04/28/20 0338 04/29/20 0418 04/30/20 0731 05/01/20 0526  NA 142  --   --  136 136 136 138  K 3.0*   < > 4.8 5.0 4.5 4.7 4.4  CL 107  --   --  102 99 102 106  CO2 27  --   --  _0 GLUCOSE 637*  --   --  125* 134* 67* 124*  BUN 10  --   --  _1 CREATININE 1.82*  --   --  2.09* 2.19* 1.88* 2.27*  CALCIUM 6.7*  --   --  7.7* 7.9* 7.8* 7.6*  MG 1.8  --   --  2.1 2.0 1.6* 2.0  PHOS 2.4*   < > 4.9* 5.2* 4.8* 3.8 4.4   < > = values in this interval not displayed.   GFR: Estimated Creatinine Clearance: 16.3 mL/min (A) (by C-G formula based on SCr of 2.27 mg/dL (H)). Liver Function Tests: No results for input(s): AST, ALT, ALKPHOS, BILITOT, PROT, ALBUMIN in the last 168  hours. No results for input(s): LIPASE, AMYLASE in the last 168 hours. No results for input(s): AMMONIA in the last 168 hours. Coagulation Profile: No results for input(s): INR, PROTIME in the last 168 hours. Cardiac Enzymes: No results for input(s): CKTOTAL, CKMB, CKMBINDEX, TROPONINI in the last 168 hours. BNP (last 3 results) No results for input(s):  PROBNP in the last 8760 hours. HbA1C: No results for input(s): HGBA1C in the last 72 hours. CBG: Recent Labs  Lab 04/30/20 1440 04/30/20 1518 04/30/20 1736 04/30/20 2032 05/01/20 0754  GLUCAP 59* 102* 91 82 102*   Lipid Profile: No results for input(s): CHOL, HDL, LDLCALC, TRIG, CHOLHDL, LDLDIRECT in the last 72 hours. Thyroid Function Tests: Recent Labs    04/30/20 0731  TSH 3.016   Anemia Panel: No results for input(s): VITAMINB12, FOLATE, FERRITIN, TIBC, IRON, RETICCTPCT in the last 72 hours. Sepsis Labs: No results for input(s): PROCALCITON, LATICACIDVEN in the last 168 hours.  No results found for this or any previous visit (from the past 240 hour(s)).       Radiology Studies: CT ABDOMEN PELVIS WO CONTRAST  Result Date: 04/30/2020 CLINICAL DATA:  69 year old female with history of abdominal aortic aneurysm. Postoperative study. EXAM: CT ABDOMEN AND PELVIS WITHOUT CONTRAST TECHNIQUE: Multidetector CT imaging of the abdomen and pelvis was performed following the standard protocol without IV contrast. COMPARISON:  CT of the pelvis 04/19/2020. CT the abdomen and pelvis 10/21/2019. FINDINGS: Lower chest: Small bilateral pleural effusions lying dependently with areas of passive subsegmental atelectasis in the lower lobes of the lungs bilaterally. Atherosclerotic calcifications in the descending thoracic aorta as well as the left anterior descending and right coronary arteries. Calcifications of the mitral annulus. Median sternotomy wires. Hepatobiliary: No definite suspicious cystic or solid hepatic lesions are identified on  today's noncontrast CT examination. Status post cholecystectomy. Pancreas: No definite pancreatic mass or peripancreatic fluid collections or inflammatory changes are noted on today's noncontrast CT examination. Spleen: Unremarkable. Adrenals/Urinary Tract: Low-attenuation lesion in the posterior aspect of the interpolar region of the left kidney, incompletely characterized on today's noncontrast CT examination. Right kidney and bilateral adrenal glands are unremarkable in appearance. No hydroureteronephrosis. Urinary bladder is normal in appearance. Stomach/Bowel: Unenhanced appearance of the stomach is unremarkable. No pathologic dilatation of small bowel or colon. Suture line near the rectosigmoid junction from prior partial colectomy. The appendix is not confidently identified and may be surgically absent. Regardless, there are no inflammatory changes noted adjacent to the cecum to suggest the presence of an acute appendicitis at this time. Vascular/Lymphatic: Aortic atherosclerosis with persistent aneurysmal dilatation of the infrarenal abdominal aorta which measures up to 3.9 x 3.8 cm (axial image 42 of series 2), similar to prior study from 10/26/2019. Aorto bi-iliac graft again noted. Bilateral renal artery stents are also noted. No lymphadenopathy noted in the abdomen or pelvis. Reproductive: Status post hysterectomy. Ovaries are not confidently identified may be surgically absent or atrophic. Other: No significant volume of ascites.  No pneumoperitoneum. Musculoskeletal: Diffuse body wall edema. There are no aggressive appearing lytic or blastic lesions noted in the visualized portions of the skeleton. IMPRESSION: 1. Aorto bi-iliac bypass graft in position with persistent aneurysmal dilatation of the infrarenal abdominal aorta measuring 3.9 x 3.8 cm, similar to prior study from 10/26/2019. 2. Small bilateral pleural effusions lying dependently with areas of passive subsegmental atelectasis in the lower  lobes of the lungs bilaterally. 3. Diffuse body wall edema. 4. Aortic atherosclerosis, in addition to 2 vessel coronary artery disease. Please note that although the presence of coronary artery calcium documents the presence of coronary artery disease, the severity of this disease and any potential stenosis cannot be assessed on this non-gated CT examination. Assessment for potential risk factor modification, dietary therapy or pharmacologic therapy may be warranted, if clinically indicated. 5. Additional incidental findings, as above. Electronically Signed  By: Vinnie Langton M.D.   On: 04/30/2020 15:22        Scheduled Meds: . (feeding supplement) PROSource Plus  30 mL Oral TID BM  . allopurinol  100 mg Oral QHS  . amitriptyline  10 mg Oral QHS  . vitamin C  500 mg Oral BID  . atorvastatin  40 mg Oral QHS  . collagenase   Topical Daily  . epoetin (EPOGEN/PROCRIT) injection  4,000 Units Intravenous Q T,Th,Sa-HD  . feeding supplement  1 Container Oral TID BM  . gabapentin  400 mg Oral TID  . heparin  5,000 Units Subcutaneous Q8H  . lipase/protease/amylase  24,000 Units Oral TID  . megestrol  400 mg Oral Daily  . metoprolol tartrate  12.5 mg Oral BID  . multivitamin  1 tablet Oral QHS  . multivitamin-lutein  1 capsule Oral Daily  . nicotine  21 mg Transdermal Daily  . pantoprazole (PROTONIX) IV  40 mg Intravenous Q12H  . polyethylene glycol-electrolytes  2,000 mL Oral Once  . senna-docusate  1 tablet Oral BID  . sertraline  50 mg Oral Daily  . traZODone  150 mg Oral QHS   Continuous Infusions: . dextrose 5 % and 0.9% NaCl 50 mL/hr at 05/01/20 0413     LOS: 11 days    Time spent: 28 minutes    Sharen Hones, MD [facility]   To contact the attending provider between 7A-7P or the covering provider during after hours 7P-7A, please log into the web site [email or web address] and access using universal  password for that web site. If you do not have the password,  please call the hospital operator.  05/01/2020, 12:16 PM

## 2020-05-01 NOTE — Care Management Important Message (Signed)
Important Message  Patient Details  Name: JATZIRI GOFFREDO MRN: [id number] Date of Birth: [birth year]   Medicare Important Message Given:  Yes     Dannette Barbara 05/01/2020, 1:29 PM

## 2020-05-01 NOTE — Consult Note (Addendum)
PHARMACY CONSULT NOTE  Pharmacy Consult for Electrolyte Monitoring and Replacement   Recent Labs: Potassium (mmol/L)  Date Value  05/01/2020 4.4   Magnesium (mg/dL)  Date Value  05/01/2020 2.0   Calcium (mg/dL)  Date Value  05/01/2020 7.6 (L)   Albumin (g/dL)  Date Value  04/24/2020 1.6 (L)   Phosphorus (mg/dL)  Date Value  05/01/2020 4.4   Sodium  Date Value  05/01/2020 138 mmol/L  07/03/2018 143   Corrected Ca:  9.52 mg/dL  Assessment: 69 year old female presented with weakness, buttock ulcer, and inadequate oral intake. Pt has a PMH of ESRD on HD TTS, CAD s/p CABG, a.fib, mitral valve repair, dCHF, PAD, bilateral renal artery stenosis, severe protein calorie malnutrition, chronic pancreatitis, COPD, HTN, and depression. Pt has a 2-year history of unintentional weight loss + poor appetite. Phos trending down, but still WNL. Possible placement of Dobbhoff if pt appetite does not improve after trial of megestrol; also started on Ensure supplement  Goal of Therapy (given cardiac history):  Magnesium 2.0 - 2.4 mg/dL Potassium 4.0 - 5.1 mmol/L All Other Electrolytes WNL  Plan:   All electrolytes WNL - no replenishment indicated at this time  Phos fluctuating, but wnl - continue to monitor  Monitor electrolytes with AM labs   Sherilyn Banker, PharmD Pharmacy Resident  05/01/2020 12:54 PM

## 2020-05-01 NOTE — Progress Notes (Signed)
Pt had only sips of drink and 3 bites of pudding. Pt was also falling asleep in front of her meal tray.

## 2020-05-01 NOTE — Progress Notes (Signed)
Mobility Specialist - Progress Note   05/01/20 1252  Mobility  Activity  (Cancellation)  Mobility performed by Mobility specialist     Per chart review, pt receiving HD treatment today. Will attempt session again at a later date/time when appropriate.    Emeli Goguen Mobility Specialist  05/01/20, 12:53 PM

## 2020-05-02 ENCOUNTER — Encounter: Payer: Self-pay | Admitting: Gastroenterology

## 2020-05-02 ENCOUNTER — Inpatient Hospital Stay: Payer: Medicare Other

## 2020-05-02 DIAGNOSIS — I5032 Chronic diastolic (congestive) heart failure: Secondary | ICD-10-CM

## 2020-05-02 DIAGNOSIS — N3001 Acute cystitis with hematuria: Secondary | ICD-10-CM

## 2020-05-02 DIAGNOSIS — S31819A Unspecified open wound of right buttock, initial encounter: Secondary | ICD-10-CM

## 2020-05-02 DIAGNOSIS — Z66 Do not resuscitate: Secondary | ICD-10-CM

## 2020-05-02 DIAGNOSIS — L89304 Pressure ulcer of unspecified buttock, stage 4: Secondary | ICD-10-CM

## 2020-05-02 DIAGNOSIS — J449 Chronic obstructive pulmonary disease, unspecified: Secondary | ICD-10-CM

## 2020-05-02 LAB — PHOSPHORUS: Phosphorus: 3 mg/dL (ref 2.5–4.6)

## 2020-05-02 LAB — BASIC METABOLIC PANEL
Anion gap: 3 — ABNORMAL LOW (ref 5–15)
BUN: 8 mg/dL (ref 8–23)
CO2: 30 mmol/L (ref 22–32)
Calcium: 7.9 mg/dL — ABNORMAL LOW (ref 8.9–10.3)
Chloride: 106 mmol/L (ref 98–111)
Creatinine, Ser: 1.8 mg/dL — ABNORMAL HIGH (ref 0.44–1.00)
GFR calc Af Amer: 33 mL/min — ABNORMAL LOW (ref >60)
GFR calc non Af Amer: 29 mL/min — ABNORMAL LOW (ref >60)
Glucose, Bld: 89 mg/dL (ref 70–99)
Potassium: 4 mmol/L (ref 3.5–5.1)
Sodium: 139 mmol/L (ref 135–145)

## 2020-05-02 LAB — CBC
HCT: 26.1 % — ABNORMAL LOW (ref 36.0–46.0)
Hemoglobin: 8.8 g/dL — ABNORMAL LOW (ref 12.0–15.0)
MCH: 35.3 pg — ABNORMAL HIGH (ref 26.0–34.0)
MCHC: 33.7 g/dL (ref 30.0–36.0)
MCV: 104.8 fL — ABNORMAL HIGH (ref 80.0–100.0)
Platelets: 149 10*3/uL — ABNORMAL LOW (ref 150–400)
RBC: 2.49 MIL/uL — ABNORMAL LOW (ref 3.87–5.11)
RDW: 15.7 % — ABNORMAL HIGH (ref 11.5–15.5)
WBC: 15.4 10*3/uL — ABNORMAL HIGH (ref 4.0–10.5)
nRBC: 0 % (ref 0.0–0.2)

## 2020-05-02 LAB — GLUCOSE, CAPILLARY
Glucose-Capillary: 97 mg/dL (ref 70–99)
Glucose-Capillary: 108 mg/dL — ABNORMAL HIGH (ref 70–99)
Glucose-Capillary: 88 mg/dL (ref 70–99)
Glucose-Capillary: 163 mg/dL — ABNORMAL HIGH (ref 70–99)

## 2020-05-02 LAB — MAGNESIUM: Magnesium: 1.7 mg/dL (ref 1.7–2.4)

## 2020-05-02 MED ORDER — OSMOLITE 1.5 CAL PO LIQD
1000.0000 mL | ORAL | Status: DC
  Administered 2020-05-02 – 2020-05-03 (×2): 1000 mL

## 2020-05-02 MED ORDER — MAGNESIUM SULFATE 2 GM/50ML IV SOLN
2.0000 g | Freq: Once | INTRAVENOUS | Status: AC
  Administered 2020-05-02: 2 g via INTRAVENOUS
  Filled 2020-05-02: qty 50

## 2020-05-02 MED ORDER — OCUVITE-LUTEIN PO CAPS
1.0000 | ORAL_CAPSULE | Freq: Every day | ORAL | Status: DC
  Administered 2020-05-03 – 2020-05-04 (×2): 1
  Filled 2020-05-02 (×2): qty 1

## 2020-05-02 MED ORDER — ATORVASTATIN CALCIUM 20 MG PO TABS
40.0000 mg | ORAL_TABLET | Freq: Every day | ORAL | Status: DC
  Administered 2020-05-02 – 2020-05-03 (×2): 40 mg
  Filled 2020-05-02 (×2): qty 2

## 2020-05-02 MED ORDER — PROSOURCE TF PO LIQD
45.0000 mL | Freq: Every day | ORAL | Status: DC
  Administered 2020-05-03 – 2020-05-04 (×2): 45 mL
  Filled 2020-05-02: qty 45

## 2020-05-02 MED ORDER — SERTRALINE HCL 50 MG PO TABS
50.0000 mg | ORAL_TABLET | Freq: Every day | ORAL | Status: DC
  Administered 2020-05-03 – 2020-05-04 (×2): 50 mg
  Filled 2020-05-02 (×2): qty 1

## 2020-05-02 MED ORDER — RENA-VITE PO TABS
1.0000 | ORAL_TABLET | Freq: Every day | ORAL | Status: DC
  Administered 2020-05-02 – 2020-05-03 (×2): 1
  Filled 2020-05-02 (×2): qty 1

## 2020-05-02 MED ORDER — ASCORBIC ACID 500 MG PO TABS
500.0000 mg | ORAL_TABLET | Freq: Two times a day (BID) | ORAL | Status: DC
  Administered 2020-05-02 – 2020-05-04 (×4): 500 mg
  Filled 2020-05-02 (×4): qty 1

## 2020-05-02 MED ORDER — MEGESTROL ACETATE 400 MG/10ML PO SUSP
400.0000 mg | Freq: Every day | ORAL | Status: DC
  Administered 2020-05-03 – 2020-05-04 (×2): 400 mg
  Filled 2020-05-02 (×2): qty 10

## 2020-05-02 MED ORDER — SENNOSIDES-DOCUSATE SODIUM 8.6-50 MG PO TABS
1.0000 | ORAL_TABLET | Freq: Two times a day (BID) | ORAL | Status: DC
  Filled 2020-05-02 (×3): qty 1

## 2020-05-02 MED ORDER — METOPROLOL TARTRATE 25 MG PO TABS
12.5000 mg | ORAL_TABLET | Freq: Two times a day (BID) | ORAL | Status: DC
  Administered 2020-05-02 – 2020-05-04 (×4): 12.5 mg
  Filled 2020-05-02 (×4): qty 1

## 2020-05-02 MED ORDER — TRAZODONE HCL 50 MG PO TABS
150.0000 mg | ORAL_TABLET | Freq: Every day | ORAL | Status: DC
  Administered 2020-05-02 – 2020-05-03 (×2): 150 mg
  Filled 2020-05-02 (×2): qty 1

## 2020-05-02 MED ORDER — GABAPENTIN 250 MG/5ML PO SOLN
400.0000 mg | Freq: Three times a day (TID) | ORAL | Status: DC
  Administered 2020-05-02 – 2020-05-04 (×6): 400 mg
  Filled 2020-05-02 (×9): qty 8

## 2020-05-02 MED ORDER — AMITRIPTYLINE HCL 10 MG PO TABS
10.0000 mg | ORAL_TABLET | Freq: Every day | ORAL | Status: DC
  Administered 2020-05-02 – 2020-05-03 (×2): 10 mg
  Filled 2020-05-02 (×3): qty 1

## 2020-05-02 MED ORDER — ALLOPURINOL 100 MG PO TABS
100.0000 mg | ORAL_TABLET | Freq: Every day | ORAL | Status: DC
  Administered 2020-05-02 – 2020-05-03 (×2): 100 mg
  Filled 2020-05-02 (×2): qty 1

## 2020-05-02 MED ORDER — FREE WATER
30.0000 mL | Status: DC
  Administered 2020-05-02 – 2020-05-04 (×13): 30 mL

## 2020-05-02 NOTE — Progress Notes (Signed)
[facility], [place] 05/02/20  Subjective:   LOS: 12 Patient seen and evaluated at bedside. Had dialysis yesterday. Dobbhoff feeding tube placed.     Objective:  Vital signs in last 24 hours:  Temp:  [98.5 F (36.9 C)-99.3 F (37.4 C)] 99 F (37.2 C) (08/27 1130) Pulse Rate:  [87-107] 107 (08/27 1130) Resp:  [13-17] 17 (08/27 0510) BP: (107-124)/(44-59) 124/44 (08/27 1130) SpO2:  [90 %-95 %] 95 % (08/27 1130)  Weight change:  Filed Weights   04/20/20 2033 04/30/20 0500 05/01/20 0500  Weight: 36.7 kg 43.6 kg 42.9 kg    Intake/Output:    Intake/Output Summary (Last 24 hours) at 05/02/2020 1454 Last data filed at 05/02/2020 0400 Gross per 24 hour  Intake 1342.11 ml  Output --  Net 1342.11 ml    Physical Exam: General:  In no acute distress  HEENT  normocephalic atraumatic, hearing intact, Dobbhoff in place  Pulm/lungs  Lungs clear, normal effort  CVS/Heart  S1S2, no rub or gallop  Abdomen:   Soft, nontender  Extremities:  No peripheral edema   Neurologic:  Alert, oriented  Skin:  Chronic decubitus ulcers +  Left arm AV fistula    Basic Metabolic Panel:  Recent Labs  Lab 04/28/20 0338 04/28/20 0338 04/29/20 0418 04/29/20 0418 04/30/20 0731 05/01/20 0526 05/01/20 1001 05/02/20 0613  NA 136  --  136  --  136 138  --  139  K 5.0  --  4.5  --  4.7 4.4  --  4.0  CL 102  --  99  --  102 106  --  106  CO2 25  --  26  --  26 24  --  30  GLUCOSE 125*  --  134*  --  67* 124*  --  89  BUN 12  --  14  --  10 11  --  8  CREATININE 2.09*  --  2.19*  --  1.88* 2.27*  --  1.80*  CALCIUM 7.7*   < > 7.9*   < > 7.8* 7.6*  --  7.9*  MG 2.1  --  2.0  --  1.6* 2.0  --  1.7  PHOS 5.2*   < > 4.8*  --  3.8 4.4 1.2* 3.0   < > = values in this interval not displayed.     CBC: Recent Labs  Lab 04/28/20 0338 04/29/20 0418 04/30/20 0731 05/01/20 0526 05/02/20 0613  WBC 7.6 13.3* 10.9* 8.6 15.4*  HGB 8.7* 11.2* 8.9* 8.6* 8.8*  HCT 26.2*  31.7* 26.4* 25.1* 26.1*  MCV 105.2* 101.3* 105.2* 103.3* 104.8*  PLT 147* 192 147* 130* 149*      Lab Results  Component Value Date   HEPBSAG NON REACTIVE 04/22/2020   HEPBSAB Non Reactive 07/27/2018      Microbiology:  No results found for this or any previous visit (from the past 240 hour(s)).  Coagulation Studies: No results for input(s): LABPROT, INR in the last 72 hours.  Urinalysis: No results for input(s): COLORURINE, LABSPEC, PHURINE, GLUCOSEU, HGBUR, BILIRUBINUR, KETONESUR, PROTEINUR, UROBILINOGEN, NITRITE, LEUKOCYTESUR in the last 72 hours.  Invalid input(s): APPERANCEUR    Imaging: DG Abd 1 View  Result Date: 05/02/2020 CLINICAL DATA:  Feeding tube placement EXAM: ABDOMEN - 1 VIEW COMPARISON:  Portable exam 1110 hours compared to 04/29/2020 FINDINGS: Tip of feeding tube projects over pylorus/duodenal bulb region. Air-filled small bowel loops with paucity of colonic gas question ileus versus obstruction. Atherosclerotic calcifications  with prior endoluminal stenting of aorta. Bibasilar infiltrates. IMPRESSION: Tip of feeding tube projects over the region of the pylorus/duodenal bulb. Electronically Signed   By: Lavonia Dana M.D.   On: 05/02/2020 11:26     Medications:   . magnesium sulfate bolus IVPB     . allopurinol  100 mg Per Tube QHS  . amitriptyline  10 mg Per Tube QHS  . vitamin C  500 mg Per Tube BID  . atorvastatin  40 mg Per Tube QHS  . collagenase   Topical Daily  . epoetin (EPOGEN/PROCRIT) injection  4,000 Units Intravenous Q T,Th,Sa-HD  . feeding supplement (OSMOLITE 1.5 CAL)  1,000 mL Per Tube Q24H  . [START ON 05/03/2020] feeding supplement (PROSource TF)  45 mL Per Tube Daily  . free water  30 mL Per Tube Q4H  . gabapentin  400 mg Per Tube TID  . heparin  5,000 Units Subcutaneous Q8H  . lipase/protease/amylase  24,000 Units Oral TID  . [START ON 05/03/2020] megestrol  400 mg Per Tube Daily  . metoprolol tartrate  12.5 mg Per Tube BID  .  multivitamin  1 tablet Per Tube QHS  . [START ON 05/03/2020] multivitamin-lutein  1 capsule Per Tube Daily  . nicotine  21 mg Transdermal Daily  . pantoprazole (PROTONIX) IV  40 mg Intravenous Q12H  . polyethylene glycol-electrolytes  2,000 mL Oral Once  . senna-docusate  1 tablet Per Tube BID  . [START ON 05/03/2020] sertraline  50 mg Per Tube Daily  . traZODone  150 mg Per Tube QHS   acetaminophen **OR** acetaminophen, albuterol, alum & mag hydroxide-simeth, diphenhydrAMINE, heparin, HYDROmorphone, midodrine, nitroGLYCERIN, ondansetron **OR** ondansetron (ZOFRAN) IV, promethazine  Assessment/ Plan:  69 y.o. female with medical problems of ESRD, on hemodialysis, CAD/CABG 03/2011, COPD, atrial fibrillation, hypertension, COPD, chronic pancreatitis and chronic abdominal pain requiring recurrent hospitalizations , h/o AAA, cholecystectomy, severe peripheral arterial disease including bilateral renal artery stent, subclavian artery stenosis, left, left CEA  was admitted on 04/19/2020 for generalized weakness and pain.  Principal Problem:   Inadequate oral intake Active Problems:   Essential hypertension   Atrial fibrillation (HCC)   Chronic diastolic heart failure (HCC)   COPD with chronic bronchitis (HCC)   S/P CABG x 3   Diabetes mellitus (HCC)   Chronic kidney disease (CKD), stage IV (severe) (HCC)   Weakness   Severe protein-calorie malnutrition (HCC)   Pressure injury, stage 4 (HCC)   ESRD on hemodialysis (HCC)   Weight loss, unintentional   UTI (urinary tract infection)   Infected wound   Failure to thrive in adult   Pressure injury of sacral region, stage 1   Esophageal dysphagia   Acute esophagitis   Acute gastric ulcer without hemorrhage or perforation   Polyp of ascending colon   Lipoma of colon  Infected wound [T14.8XXA, L08.9] Weakness [R53.1] Failure to thrive in adult [R62.7] Inadequate oral intake [R63.8] Acute cystitis with hematuria [N30.01] Wound of right  buttock, initial encounter [S31.819A] Pressure injury of sacral region, stage 1 [place]/TTS/left arm AV fistula/CK [place]  #. ESRD No urgent indication for dialysis.  Has required minimal ultrafiltration given poor p.o. intake. #. Anemia of CKD  Lab Results  Component Value Date   HGB 8.8 (L) 05/02/2020   Continue Epogen with dialysis treatments.  #. Secondary hyperparathyroidism of renal origin N 25.81      Component Value Date/Time   PTH 80 (H) 08/15/2018 1435   Lab Results  Component Value Date   PHOS 3.0 05/02/2020  Phosphorus 3.0 at last check.  Continue to periodically monitor abdominal metabolism parameters.    #.  Rt Buttock wound S/p I & D rt ischial decub ulcer,managing by the wound care team May be worsened by severe protein calorie malnutrition Hopefully once her nutrition improves her wounds can heal.  #Urinary tract infection E. coli and Enterococcus faecalis, treated with antibiotics.   #Malnutrition Primary team considering PEG placement.    LOS: 12 [name] 8/27/20212:54 PM  [place], [place]

## 2020-05-02 NOTE — Progress Notes (Signed)
Patient ID: [name], female   DOB: 06-07-1951, 69 y.o.   MRN: [id number]    This NP reviewed medical records and then spoke by phone as a f/u to previous conversations, to both daughter/Shannon and sister/Kim regarding current medical situation.  Patient has a complex medical situation significant for AAA (status post repair in May 2019), heart failure, CAD (on Plavix), atrial fibrillation (not on anticoagulation), chronic renal insufficiency (now on permanent hemodialysis), peripheral vascular disease (status post bypasses), carotid artery disease (status post CEA and left subclavian stent), on home O2 at night only, ongoing tobacco abuse, hypertension, diabetes, hyperlipidemia, status post cholecystectomy, severe idiopathic pancreatitis, failure to thrive.  Unable to place PEG, now considering temporary Doboff.  She continues to decline physically and functionally.  Poor po intake, continued weight loss, skin breakdown, chronic pain, overall failure to thrive.  Daughter [name] verbalizes a plethora of emotions, "I don't know what to do",  "I feel like she is going thorough medcial torture"  "it is so confusing I'm grieving a living person"  Therapeutic listening and emotional support.  I again offered education regarding concepts specific to human mortality and adult failure to thrive and the limitations of medical interventions to prolong quality of life when the body does fail to thrive.  Patient and family are struggling with situation  Questions and concerns addressed     Discussed with  family the importance of continued conversation with patient and each other,  and the medical providers regarding overall plan of care and treatment options,  ensuring decisions are within the context of the patients values and GOCs.   Spoke to my co-worker Katha Hamming and she will see patient today for inpatient  f/u visit  No charge  Wadie Lessen NP  Palliative Medicine Team Team Phone # [phone] Pager [phone]

## 2020-05-02 NOTE — Progress Notes (Signed)
Daily Progress Note   Patient Name: Amy [name]       Date: 05/02/2020 DOB: November 09, 1950  Age: 69 y.o. MRN#: [id number] Attending Physician: Amy Hones, MD Primary Care Physician: Amy Limbo, MD Admit Date: 04/19/2020  Reason for Consultation/Follow-up: Establishing goals of care  Subjective: Patient awake and sitting up in bed. Complains of some sacral discomfort. Dobbhoff tube in place and currently clamped. Patient reports she tolerated well and is ready to begin initiating of feedings with hopes of regaining some strength.  Daughters, Amy [name] and Amy [name] at the bedside. We continued goals of care discussion as requested. Family recently engaged in discussions with my colleague, Amy [name]. Amy [name] shares her appreciation and excitement of Amy [name]'s ability to work with PT today including standing and marching in place several times.   We discussed in details patient's continued poor appetite, several malnutrition, sacral wound with poor healing, and her ESRD and HD toleration.  Patient and family expresses understanding of her condition. Amy [name] and Amy [name] shares their continued challenge with caring for their mother and her ongoing struggles with poor appetite and nutrition. Amy [name] shares her struggles with this also. She reports she does not have and appetite and if she does she feels full after only a few bites. She reports she at times have tried to force herself to eat and that makes her fatigued and nauseated. Therapeutic listening and support given.   Amy [name] shares concerns for patient but also shares that they would like patient to have every opportunity to show [name] of improvement by receiving medical care. She begins to explain to patient previous discussions with the medical team and expresses to patient she is somewhat tired of being asked what to do for her mom, knowing she Amy [name]) has expressed she wants to live and has the ability to make that decision. Amy [name] reports she knows that  is our job but they will do exactly what their mom request and allow her to speak on her own behalf as long as she can. Therapeutic listening and support provided.   Explanation provided to patient and family regarding our role as palliative. I emphasized we are not intending to force any kind of decisions or pressure family or the patient, however to be an additional layer of support to them. We hope to achieve this by providing detail updates regarding patient's condition, and having open and honest discussions regarding short term and long-term outlooks focusing on patient's quality of life and what's most important to her. Amy [name] verbalizes understanding and appreciation.   Patient is clear in expressing her goals are to continued with aggressive medical interventions and remaining hopeful that she will somewhat improve and regain some of her quality of life back. I used this opportunity to support her but also share worries of her expectations emphasizing her and her daughters' previously mentioned struggles in patient's health and continued decline since February. Patient and family verbalized understanding.   I discussed at length with patient and family best case and worst case scenarios. I also encouraged patient to provide her family and myself with her definition on what quality of life means for her. Patient's description included increased appetite, spending time with family, and the ability to provide some self-care acknowledging lack of all of these things now.  Amy [name] expressed to myself and her daughters she wishes to continue with full aggressive care with limitation of DNR. She reports if and when she becomes incapable of making decisions for herself she is hopeful her daughters  will make the best decisions for her at that time. Amy [name] shares her concerns of patient suffering and watching her decline. Patient reports she does not feel as though she is suffering. Daughters verbalized  understanding of her feelings. Amy [name] shares with her daughters if she is not able to make her own decisions if they feel she is struggling and there is nothing left to be done for her she would be at peace knowing her daughters will make the best decisions for her even if it means comfort/hospice.   Patient expresses she is not in agreement with discontinuing any treatments at this time and continue with all care including feeding tube and HD. Amy [name] mentions being told patient could potentially have [place] outpatient. Detailed education provided on risk and recommendations against this particular tube in the outpatient including limited home care options. Family verbalized understanding.   All questions answered. Encouraged patient and family to continue ongoing discussions.   Length of Stay: 12 days  Vital [name]: BP (!) 124/44 (BP Location: Right Arm)   Pulse (!) 107   Temp 99 F (37.2 C) (Oral)   Resp 17   Ht 5\' 5"  (1.651 m)   Wt 42.9 kg   SpO2 95%   BMI 15.74 kg/m  SpO2: SpO2: 95 % O2 Device: O2 Device: Room Air O2 Flow Rate: O2 Flow Rate (L/min): 2 L/min  Physical Exam: -NAD, A&O x3, cachectic, chronically-ill appearing -normal breathing pattern -Dobbhoff tube in place and clamed -mood appropriate, follows commands             Palliative Care Assessment & Plan    Code Status:  DNR  Goals of Care/Recommendations:  DNR/DNI-confirmed by patient/daughters  Patient requesting to continue with full scope/aggressive care  [name] and her family remains hopeful for some improvement/stability. Hopeful that feeding tube will provide needed nutrition to allow patient the opportunity to improve and thrive. [name] has expressed clear and set goals for continued care which daughters remain in agreement with. She has expressed if she becomes incapable of making decisions she is sure her daughters will make the best decisions based on her situation even if that includes  comfort/hospice, however until then she is not interested in withdrawing any forms of care outside of DNR.   Encouraged patient and family to continue with ongoing [place] discussions and focusing on patient's quality of life not just currently but over the past 3-4 months as patient and daughters all speak to her decline and challenges.   PMT will continue to support and follow as needed.   Prognosis: Guarded-Poor   Discharge Planning: To Be Determined  Thank you for allowing the Palliative Medicine Team to assist in the care of this patient.  Time Total: 65 min.   Visit consisted of counseling and education dealing with the complex and emotionally intense issues of symptom management and palliative care in the setting of serious and potentially life-threatening illness.Greater than 50%  of this time was spent counseling and coordinating care related to the above assessment and plan.  Alda Lea, AGPCNP-BC  Palliative Medicine Team [phone]

## 2020-05-02 NOTE — Consult Note (Addendum)
[facility] for Electrolyte Monitoring and Replacement   Recent Labs: Potassium (mmol/L)  Date Value  05/02/2020 4.0   Magnesium (mg/dL)  Date Value  05/02/2020 1.7   Calcium (mg/dL)  Date Value  05/02/2020 7.9 (L)   Albumin (g/dL)  Date Value  04/24/2020 1.6 (L)   Phosphorus (mg/dL)  Date Value  05/01/2020 1.2 (L)   Sodium  Date Value  05/02/2020 139 mmol/L  07/03/2018 143   Corrected Ca:  9.82 mg/dL Add on Phos: 3.0 mg/dL  Assessment: 69 year old female presented with weakness, buttock ulcer, and inadequate oral intake. Pt has a PMH of ESRD on HD TTS, CAD s/p CABG, a.fib, mitral valve repair, dCHF, PAD, bilateral renal artery stenosis, severe protein calorie malnutrition, chronic pancreatitis, COPD, HTN, and depression. Pt has a 2-year history of unintentional weight loss + poor appetite. Per MD, plan to place Dobbhoff and start tube feeds.   Goal of Therapy (given cardiac history):  Magnesium 2.0 - 2.4 mg/dL Potassium 4.0 - 5.1 mmol/L All Other Electrolytes WNL  Plan:   Mg 1.7 - will replace with 2g IV x1 given   Monitor electrolytes with AM labs   Sherilyn Banker, PharmD Pharmacy Resident  05/02/2020 10:03 AM

## 2020-05-02 NOTE — Progress Notes (Signed)
Vonda Antigua, MD [address] [place], [place], [place], [address] [address], [place], [place], [place], [address] Phone: [phone]  Fax: [phone]   Subjective: Patient awake and more conversive today.  Daughter at bedside.  Dobbhoff tube in place   Objective: Exam: Vital signs in last 24 hours: Vitals:   05/01/20 1451 05/01/20 1950 05/02/20 0510 05/02/20 1130  BP: (!) 116/93 (!) 121/50 (!) 107/59 (!) 124/44  Pulse: (!) 103 87 (!) 107 (!) 107  Resp: 16 13 17    Temp: 98 F (36.7 C) 99.3 F (37.4 C) 98.5 F (36.9 C) 99 F (37.2 C)  TempSrc:  Axillary Oral Oral  SpO2: 95% 90% 92% 95%  Weight:      Height:       Weight change:   Intake/Output Summary (Last 24 hours) at 05/02/2020 1220 Last data filed at 05/02/2020 0400 Gross per 24 hour  Intake 1342.11 ml  Output -500 ml  Net 1842.11 ml    General: No acute distress, AAO x3 Abd: Soft, NT/ND, No HSM Skin: Warm, no rashes Neck: Supple, Trachea midline   Lab Results: Lab Results  Component Value Date   WBC 15.4 (H) 05/02/2020   HGB 8.8 (L) 05/02/2020   HCT 26.1 (L) 05/02/2020   MCV 104.8 (H) 05/02/2020   PLT 149 (L) 05/02/2020   Micro Results: No results found for this or any previous visit (from the past 240 hour(s)). Studies/Results: CT ABDOMEN PELVIS WO CONTRAST  Result Date: 04/30/2020 CLINICAL DATA:  69 year old female with history of abdominal aortic aneurysm. Postoperative study. EXAM: CT ABDOMEN AND PELVIS WITHOUT CONTRAST TECHNIQUE: Multidetector CT imaging of the abdomen and pelvis was performed following the standard protocol without IV contrast. COMPARISON:  CT of the pelvis 04/19/2020. CT the abdomen and pelvis 10/21/2019. FINDINGS: Lower chest: Small bilateral pleural effusions lying dependently with areas of passive subsegmental atelectasis in the lower lobes of the lungs bilaterally. Atherosclerotic calcifications in the descending thoracic aorta as well as the left anterior  descending and right coronary arteries. Calcifications of the mitral annulus. Median sternotomy wires. Hepatobiliary: No definite suspicious cystic or solid hepatic lesions are identified on today's noncontrast CT examination. Status post cholecystectomy. Pancreas: No definite pancreatic mass or peripancreatic fluid collections or inflammatory changes are noted on today's noncontrast CT examination. Spleen: Unremarkable. Adrenals/Urinary Tract: Low-attenuation lesion in the posterior aspect of the interpolar region of the left kidney, incompletely characterized on today's noncontrast CT examination. Right kidney and bilateral adrenal glands are unremarkable in appearance. No hydroureteronephrosis. Urinary bladder is normal in appearance. Stomach/Bowel: Unenhanced appearance of the stomach is unremarkable. No pathologic dilatation of small bowel or colon. Suture line near the rectosigmoid junction from prior partial colectomy. The appendix is not confidently identified and may be surgically absent. Regardless, there are no inflammatory changes noted adjacent to the cecum to suggest the presence of an acute appendicitis at this time. Vascular/Lymphatic: Aortic atherosclerosis with persistent aneurysmal dilatation of the infrarenal abdominal aorta which measures up to 3.9 x 3.8 cm (axial image 42 of series 2), similar to prior study from 10/26/2019. Aorto bi-iliac graft again noted. Bilateral renal artery stents are also noted. No lymphadenopathy noted in the abdomen or pelvis. Reproductive: Status post hysterectomy. Ovaries are not confidently identified may be surgically absent or atrophic. Other: No significant volume of ascites.  No pneumoperitoneum. Musculoskeletal: Diffuse body wall edema. There are no aggressive appearing lytic or blastic lesions noted in the visualized portions of the skeleton. IMPRESSION: 1. Aorto  bi-iliac bypass graft in position with persistent aneurysmal dilatation of the infrarenal  abdominal aorta measuring 3.9 x 3.8 cm, similar to prior study from 10/26/2019. 2. Small bilateral pleural effusions lying dependently with areas of passive subsegmental atelectasis in the lower lobes of the lungs bilaterally. 3. Diffuse body wall edema. 4. Aortic atherosclerosis, in addition to 2 vessel coronary artery disease. Please note that although the presence of coronary artery calcium documents the presence of coronary artery disease, the severity of this disease and any potential stenosis cannot be assessed on this non-gated CT examination. Assessment for potential risk factor modification, dietary therapy or pharmacologic therapy may be warranted, if clinically indicated. 5. Additional incidental findings, as above. Electronically Signed   By: Vinnie Langton M.D.   On: 04/30/2020 15:22   DG Abd 1 View  Result Date: 05/02/2020 CLINICAL DATA:  Feeding tube placement EXAM: ABDOMEN - 1 VIEW COMPARISON:  Portable exam 1110 hours compared to 04/29/2020 FINDINGS: Tip of feeding tube projects over pylorus/duodenal bulb region. Air-filled small bowel loops with paucity of colonic gas question ileus versus obstruction. Atherosclerotic calcifications with prior endoluminal stenting of aorta. Bibasilar infiltrates. IMPRESSION: Tip of feeding tube projects over the region of the pylorus/duodenal bulb. Electronically Signed   By: Lavonia Dana M.D.   On: 05/02/2020 11:26   Medications:  Scheduled Meds: . (feeding supplement) PROSource Plus  30 mL Oral TID BM  . allopurinol  100 mg Oral QHS  . amitriptyline  10 mg Oral QHS  . vitamin C  500 mg Oral BID  . atorvastatin  40 mg Oral QHS  . collagenase   Topical Daily  . epoetin (EPOGEN/PROCRIT) injection  4,000 Units Intravenous Q T,Th,Sa-HD  . feeding supplement  1 Container Oral TID BM  . feeding supplement (ENSURE ENLIVE)  237 mL Oral BID BM  . gabapentin  400 mg Oral TID  . heparin  5,000 Units Subcutaneous Q8H  . lipase/protease/amylase  24,000  Units Oral TID  . megestrol  400 mg Oral Daily  . metoprolol tartrate  12.5 mg Oral BID  . multivitamin  1 tablet Oral QHS  . multivitamin-lutein  1 capsule Oral Daily  . nicotine  21 mg Transdermal Daily  . pantoprazole (PROTONIX) IV  40 mg Intravenous Q12H  . polyethylene glycol-electrolytes  2,000 mL Oral Once  . senna-docusate  1 tablet Oral BID  . sertraline  50 mg Oral Daily  . traZODone  150 mg Oral QHS   Continuous Infusions: . dextrose 5 % and 0.9% NaCl 50 mL/hr at 05/01/20 1605  . magnesium sulfate bolus IVPB     PRN Meds:.acetaminophen **OR** acetaminophen, albuterol, alum & mag hydroxide-simeth, diphenhydrAMINE, heparin, HYDROmorphone, midodrine, nitroGLYCERIN, ondansetron **OR** ondansetron (ZOFRAN) IV, promethazine   Assessment: Principal Problem:   Inadequate oral intake Active Problems:   Essential hypertension   Atrial fibrillation (HCC)   Chronic diastolic heart failure (HCC)   COPD with chronic bronchitis (HCC)   S/P CABG x 3   Diabetes mellitus (HCC)   Chronic kidney disease (CKD), stage IV (severe) (HCC)   Weakness   Severe protein-calorie malnutrition (HCC)   Pressure injury, stage 4 (HCC)   ESRD on hemodialysis (HCC)   Weight loss, unintentional   UTI (urinary tract infection)   Infected wound   Failure to thrive in adult   Pressure injury of sacral region, stage 1   Esophageal dysphagia   Acute esophagitis   Acute gastric ulcer without hemorrhage or perforation   Polyp of ascending  colon   Lipoma of colon    Plan: I discussed with patient and daughter in detail about the presence of gastric ulceration seen on upper endoscopy and would not recommend feeding tube placement in this setting   I also encouraged small frequent meals as patient is able to tolerate oral diet and aggressive treatment of gastric ulceration and esophagitis may help her improve her appetite and help her with her oral intake  In the meantime, Dobbhoff tube feeds can be  used until patient able to maintain appropriate caloric intake via oral feeds  Another option is surgically placed feeding tube in the small bowel.  Please consult surgery for the above.  Alternatively, if patient continues to have problems maintaining control at intake, feeding tube can be rediscussed after 6 to 8 weeks to allow for gastric ulceration to heal  Continue PPI twice daily Continue working closely with nutrition  GI service will sign off at this time, please page with any questions or concerns   LOS: 12 days   Vonda Antigua, MD 05/02/2020, 12:20 PM

## 2020-05-02 NOTE — Progress Notes (Signed)
PROGRESS NOTE    CLAUDE SWENDSEN  KGY:[id number] DOB: [birth year] DOA: 04/19/2020 PCP: Bernerd Limbo, MD   Chief complaint.  Anorexia. Brief Narrative:  Briefly this is a 69 year old Caucasian female with extensive past medical history of ESRD on hemodialysis TTS, CAD status post CABG, status post mitral valve repair, atrial fibrillation on anticoagulation, chronic diastolic congestive heart failure, PAD, bilateral renal artery stenosis, severe protein calorie malnutrition, chronic pancreatitis, chronic narcotic use, COPD, hypertension, depression.  She presented to the emergency department yesterday with weakness, generalized pain, acute on chronic pain of the right buttock ulcer. The patient's been followed by wound care clinic. However the wound had worsened and pain worsened to the point that the patient presented to the emergency department. She is also markedly malnourished weight has dropped to 37.6 kg. BMI 13.8.  8/26.  EGD was performed yesterday showed diffuse esophagitis and gastric ulcer.  Colonoscopy showed multiple colonic polyps, polypectomy performed.  8/27.  Discussed with patient daughter and the patient herself, will place Dobbhoff for tube feeding.    Assessment & Plan:   Principal Problem:   Inadequate oral intake Active Problems:   Essential hypertension   Atrial fibrillation (HCC)   Chronic diastolic heart failure (HCC)   COPD with chronic bronchitis (HCC)   S/P CABG x 3   Diabetes mellitus (HCC)   Chronic kidney disease (CKD), stage IV (severe) (HCC)   Weakness   Severe protein-calorie malnutrition (HCC)   Pressure injury, stage 4 (HCC)   ESRD on hemodialysis (HCC)   Weight loss, unintentional   UTI (urinary tract infection)   Infected wound   Failure to thrive in adult   Pressure injury of sacral region, stage 1   Esophageal dysphagia   Acute esophagitis   Acute gastric ulcer without hemorrhage or perforation   Polyp of ascending colon   Lipoma of  colon  #1.  Anorexia, severe protein calorie malnutrition and adult failure to thrive. Patient still cannot eat, very poor appetite.  After long discussion with patient family, will place Dobbhoff and start tube feeding.  2.  Infected right buttock wound, right ankle wound. Antibiotics completed.  Continue wound care.  3.  Type 2 diabetes with hypoglycemia. Currently on D5 to maintain glucose.  4.  End-stage renal disease on hemodialysis. Continue followed by nephrology.  5 urinary tract infection.  Completed antibiotics.  6.  Atrial fibrillation. Not chronically on any anticoagulation.  7.  Chronic diastolic congestive heart failure. No volume overload.  8.  Anemia of chronic disease.  9.  CODE STATUS. Discussed with patient daughter, she has discussed with her all her family members, patient will be a DO NOT RESUSCITATE status.  Patient has some confusion, she has a limited understanding.  At this point, family decision stands.  10.  Goals of care discussion. Patient and family feels that the patient condition has not been improving, she has been deteriorating over time.  We will start tube feeding at this point, and treat her for 3 or 4 days.  Then consider remove the feeding tube if she does not improve significantly.  If she still cannot eat after tube removal, patient can be referred to palliative care/hospice.  They will make a decision at that time if dialysis will be continued.  11.  Esophagitis and gastric ulcer. Continue PPI.  DVT prophylaxis:Heparin Code Status:Full Family Communication: Discussed with patient daughter, all questions answered. Disposition Plan:  Patient came from:Home  Anticipated d/c place:SNF  Barriers to d/c OR conditions  which need to be met to effect a safe d/c:   Consultants:  GI  Procedures:EGD  andcolonoscopy Antimicrobials: None   Subjective: Patient still has a very poor appetite, spoke with the nurse, she did not eat her lunch and dinner yesterday. She denies any short of breath or cough. No abdominal pain or nausea vomiting.  Objective: Vitals:   05/01/20 1340 05/01/20 1451 05/01/20 1950 05/02/20 0510  BP: 117/61 (!) 116/93 (!) 121/50 (!) 107/59  Pulse: 99 (!) 103 87 (!) 107  Resp: _0 Temp: 98.4 F (36.9 C) 98 F (36.7 C) 99.3 F (37.4 C) 98.5 F (36.9 C)  TempSrc: Oral  Axillary Oral  SpO2: 96% 95% 90% 92%  Weight:      Height:        Intake/Output Summary (Last 24 hours) at 05/02/2020 0934 Last data filed at 05/02/2020 0400 Gross per 24 hour  Intake 1342.11 ml  Output -500 ml  Net 1842.11 ml   Filed Weights   04/20/20 2033 04/30/20 0500 05/01/20 0500  Weight: 36.7 kg 43.6 kg 42.9 kg    Examination:  General exam: Appears calm and comfortable, ill-appearing, cachectic. Respiratory system: Clear to auscultation. Respiratory effort normal. Cardiovascular system: S1 & S2 heard, RRR. No JVD, murmurs, rubs, gallops or clicks. No pedal edema. Gastrointestinal system: Abdomen is nondistended, soft and nontender. No organomegaly or masses felt. Normal bowel sounds heard. Central nervous system: Alert and oriented x2. No focal neurological deficits. Extremities: Symmetric  Skin: No rashes, lesions or ulcers Psychiatry: Judgement and insight appear normal. Mood & affect appropriate.     Data Reviewed: I have personally reviewed following labs and imaging studies  CBC: Recent Labs  Lab 04/28/20 0338 04/29/20 0418 04/30/20 0731 05/01/20 0526 05/02/20 0613  WBC 7.6 13.3* 10.9* 8.6 15.4*  HGB 8.7* 11.2* 8.9* 8.6* 8.8*  HCT 26.2* 31.7* 26.4* 25.1* 26.1*  MCV 105.2* 101.3* 105.2* 103.3* 104.8*  PLT 147* 192 147* 130* 353*   Basic Metabolic Panel: Recent Labs  Lab 04/28/20 0338 04/29/20 0418 04/30/20 0731 05/01/20 0526 05/01/20 1001  05/02/20 0613  NA 136 136 136 138  --  139  K 5.0 4.5 4.7 4.4  --  4.0  CL 102 99 102 106  --  106  CO2 _1 --  30  GLUCOSE 125* 134* 67* 124*  --  89  BUN _2 --  8  CREATININE 2.09* 2.19* 1.88* 2.27*  --  1.80*  CALCIUM 7.7* 7.9* 7.8* 7.6*  --  7.9*  MG 2.1 2.0 1.6* 2.0  --  1.7  PHOS 5.2* 4.8* 3.8 4.4 1.2*  --    GFR: Estimated Creatinine Clearance: 20.5 mL/min (A) (by C-G formula based on SCr of 1.8 mg/dL (H)). Liver Function Tests: No results for input(s): AST, ALT, ALKPHOS, BILITOT, PROT, ALBUMIN in the last 168 hours. No results for input(s): LIPASE, AMYLASE in the last 168 hours. No results for input(s): AMMONIA in the last 168 hours. Coagulation Profile: No results for input(s): INR, PROTIME in the last 168 hours. Cardiac Enzymes: No results for input(s): CKTOTAL, CKMB, CKMBINDEX, TROPONINI in the last 168 hours. BNP (last 3 results) No results for input(s): PROBNP in the last 8760 hours. HbA1C: No results for input(s): HGBA1C in the last 72 hours. CBG: Recent Labs  Lab 04/30/20 2032 05/01/20 0754 05/01/20 1630 05/01/20 2139 05/02/20 0731  GLUCAP 82 102* 144* 118* 97   Lipid Profile: No  results for input(s): CHOL, HDL, LDLCALC, TRIG, CHOLHDL, LDLDIRECT in the last 72 hours. Thyroid Function Tests: Recent Labs    04/30/20 0731  TSH 3.016   Anemia Panel: No results for input(s): VITAMINB12, FOLATE, FERRITIN, TIBC, IRON, RETICCTPCT in the last 72 hours. Sepsis Labs: No results for input(s): PROCALCITON, LATICACIDVEN in the last 168 hours.  No results found for this or any previous visit (from the past 240 hour(s)).       Radiology Studies: CT ABDOMEN PELVIS WO CONTRAST  Result Date: 04/30/2020 CLINICAL DATA:  69 year old female with history of abdominal aortic aneurysm. Postoperative study. EXAM: CT ABDOMEN AND PELVIS WITHOUT CONTRAST TECHNIQUE: Multidetector CT imaging of the abdomen and pelvis was performed following the standard  protocol without IV contrast. COMPARISON:  CT of the pelvis 04/19/2020. CT the abdomen and pelvis 10/21/2019. FINDINGS: Lower chest: Small bilateral pleural effusions lying dependently with areas of passive subsegmental atelectasis in the lower lobes of the lungs bilaterally. Atherosclerotic calcifications in the descending thoracic aorta as well as the left anterior descending and right coronary arteries. Calcifications of the mitral annulus. Median sternotomy wires. Hepatobiliary: No definite suspicious cystic or solid hepatic lesions are identified on today's noncontrast CT examination. Status post cholecystectomy. Pancreas: No definite pancreatic mass or peripancreatic fluid collections or inflammatory changes are noted on today's noncontrast CT examination. Spleen: Unremarkable. Adrenals/Urinary Tract: Low-attenuation lesion in the posterior aspect of the interpolar region of the left kidney, incompletely characterized on today's noncontrast CT examination. Right kidney and bilateral adrenal glands are unremarkable in appearance. No hydroureteronephrosis. Urinary bladder is normal in appearance. Stomach/Bowel: Unenhanced appearance of the stomach is unremarkable. No pathologic dilatation of small bowel or colon. Suture line near the rectosigmoid junction from prior partial colectomy. The appendix is not confidently identified and may be surgically absent. Regardless, there are no inflammatory changes noted adjacent to the cecum to suggest the presence of an acute appendicitis at this time. Vascular/Lymphatic: Aortic atherosclerosis with persistent aneurysmal dilatation of the infrarenal abdominal aorta which measures up to 3.9 x 3.8 cm (axial image 42 of series 2), similar to prior study from 10/26/2019. Aorto bi-iliac graft again noted. Bilateral renal artery stents are also noted. No lymphadenopathy noted in the abdomen or pelvis. Reproductive: Status post hysterectomy. Ovaries are not confidently identified  may be surgically absent or atrophic. Other: No significant volume of ascites.  No pneumoperitoneum. Musculoskeletal: Diffuse body wall edema. There are no aggressive appearing lytic or blastic lesions noted in the visualized portions of the skeleton. IMPRESSION: 1. Aorto bi-iliac bypass graft in position with persistent aneurysmal dilatation of the infrarenal abdominal aorta measuring 3.9 x 3.8 cm, similar to prior study from 10/26/2019. 2. Small bilateral pleural effusions lying dependently with areas of passive subsegmental atelectasis in the lower lobes of the lungs bilaterally. 3. Diffuse body wall edema. 4. Aortic atherosclerosis, in addition to 2 vessel coronary artery disease. Please note that although the presence of coronary artery calcium documents the presence of coronary artery disease, the severity of this disease and any potential stenosis cannot be assessed on this non-gated CT examination. Assessment for potential risk factor modification, dietary therapy or pharmacologic therapy may be warranted, if clinically indicated. 5. Additional incidental findings, as above. Electronically Signed   By: Vinnie Langton M.D.   On: 04/30/2020 15:22        Scheduled Meds: . (feeding supplement) PROSource Plus  30 mL Oral TID BM  . allopurinol  100 mg Oral QHS  . amitriptyline  10  mg Oral QHS  . vitamin C  500 mg Oral BID  . atorvastatin  40 mg Oral QHS  . collagenase   Topical Daily  . epoetin (EPOGEN/PROCRIT) injection  4,000 Units Intravenous Q T,Th,Sa-HD  . feeding supplement  1 Container Oral TID BM  . feeding supplement (ENSURE ENLIVE)  237 mL Oral BID BM  . gabapentin  400 mg Oral TID  . heparin  5,000 Units Subcutaneous Q8H  . lipase/protease/amylase  24,000 Units Oral TID  . megestrol  400 mg Oral Daily  . metoCLOPramide  5 mg Oral TID AC  . metoprolol tartrate  12.5 mg Oral BID  . multivitamin  1 tablet Oral QHS  . multivitamin-lutein  1 capsule Oral Daily  . nicotine  21 mg  Transdermal Daily  . pantoprazole (PROTONIX) IV  40 mg Intravenous Q12H  . polyethylene glycol-electrolytes  2,000 mL Oral Once  . senna-docusate  1 tablet Oral BID  . sertraline  50 mg Oral Daily  . traZODone  150 mg Oral QHS   Continuous Infusions: . dextrose 5 % and 0.9% NaCl 50 mL/hr at 05/01/20 1605     LOS: 12 days    Time spent: 61 munites    Sharen Hones, MD [facility]   To contact the attending provider between 7A-7P or the covering provider during after hours 7P-7A, please log into the web site [email or web address] and access using universal [place] password for that web site. If you do not have the password, please call the hospital operator.  05/02/2020, 9:34 AM

## 2020-05-02 NOTE — Progress Notes (Signed)
Physical Therapy Treatment Patient Details Name: [name] MRN: [id number] DOB: 1950/11/25 Today's Date: 05/02/2020    History of Present Illness Pt presented to ER secondary to decreased PO intake, progressive weakness and inability to get OOB/mobilize in home environment; admitted for management of inadequate oral intake, UTI and sacral decub (s/p bedside debridement)    PT Comments    Pt was supine in bed with HOB elevated ~ 20 degrees. She agrees to PT session and is cooperative and pleasant throughout. Does fatigue quickly with minimal activity. Supportive daughter present throughout. Pt was able to roll R to short sit with vcs and mod assist. Sat EOB x several minutes prior to standing to RW with min assist from elevated bed height. Pt was able to alternate marching in place 2 x 20 with seated rest between trials. HR elevated to 130 but quickly recovers to 110 with seated rest. Overall pt tolerated session well and is progressing well towards PT goals.  Will need extensive skilled PT going forward for strengthening and improved safe functional abilities. At conclusion of session, pt was in bed with call bell in reach and daughter at bedside.     Follow Up Recommendations  SNF     Equipment Recommendations  Rolling walker with 5" wheels;3in1 (PT)    Recommendations for Other Services       Precautions / Restrictions Precautions Precautions: Fall Precaution Comments: sacral decub, R lateral heel wound Restrictions Weight Bearing Restrictions: No    Mobility  Bed Mobility Overal bed mobility: Needs Assistance Bed Mobility: Rolling;Sidelying to Sit;Sit to Supine Rolling: Min assist Sidelying to sit: Mod assist Supine to sit: Mod assist Sit to supine: Mod assist   General bed mobility comments: Mod assist + increased time to roll R to short sit.   Transfers Overall transfer level: Needs assistance Equipment used: Rolling walker (2 wheeled) Transfers: Sit to/from  Stand Sit to Stand: Min assist;From elevated surface         General transfer comment: Min assist to STS from elevated bed height 2 x. did performing standing exercises but elected not to ambulate away fropm EOB 2/2 to fatigue and elevated HR to 130  Ambulation/Gait             General Gait Details: pt was able to alternate marching in place 20 x with seated rest between trials.    Stairs             Wheelchair Mobility    Modified Rankin (Stroke Patients Only)       Balance Overall balance assessment: Needs assistance Sitting-balance support: No upper extremity supported;Feet supported Sitting balance-Leahy Scale: Good Sitting balance - Comments: steady sitting reaching within BOS   Standing balance support: Bilateral upper extremity supported Standing balance-Leahy Scale: Good Standing balance comment: no LOB but requires BUE support in standing for keeping balance                            Cognition Arousal/Alertness: Awake/alert Behavior During Therapy: WFL for tasks assessed/performed Overall Cognitive Status: Within Functional Limits for tasks assessed                                 General Comments: Pt is A and O x 4 and agreeable to PT session. very pleasnat and cooperative throughout      Exercises      General  Comments        Pertinent Vitals/Pain Pain Location: buttocks, R lateral malleolus, pancreas Pain Descriptors / Indicators: Burning Pain Intervention(s): Limited activity within patient's tolerance;Monitored during session;Premedicated before session;Repositioned    Home Living                      Prior Function            PT Goals (current goals can now be found in the care plan section) Acute Rehab PT Goals Patient Stated Goal: to get my strength back Progress towards PT goals: Progressing toward goals    Frequency    Min 2X/week      PT Plan Current plan remains appropriate     Co-evaluation              AM-PAC PT "6 Clicks" Mobility   Outcome Measure  Help needed turning from your back to your side while in a flat bed without using bedrails?: A Little Help needed moving from lying on your back to sitting on the side of a flat bed without using bedrails?: A Lot Help needed moving to and from a bed to a chair (including a wheelchair)?: A Lot Help needed standing up from a chair using your arms (e.g., wheelchair or bedside chair)?: A Lot Help needed to walk in hospital room?: A Lot Help needed climbing 3-5 steps with a railing? : A Lot 6 Click Score: 13    End of Session Equipment Utilized During Treatment: Gait belt Activity Tolerance: Patient tolerated treatment well Patient left: in bed;with call bell/phone within reach;with bed alarm set;Other (comment) Nurse Communication: Mobility status PT Visit Diagnosis: Muscle weakness (generalized) (M62.81);Difficulty in walking, not elsewhere classified (R26.2)     Time: 3154-0086 PT Time Calculation (min) (ACUTE ONLY): 27 min  Charges:  $Therapeutic Exercise: 8-22 mins $Therapeutic Activity: 8-22 mins                     Julaine Fusi PTA 05/02/20, 1:09 PM

## 2020-05-02 NOTE — Progress Notes (Signed)
Nutrition Follow Up Note   DOCUMENTATION CODES:   Severe malnutrition in context of chronic illness  INTERVENTION:   Initiate Osmolite 1.5 _0 /hr + Pro-Source 21m daily via tube  Free water flushes 340mq4 hours to maintain tube patency   Regimen provides 1480kcal/day, 71g/day protein and 91295may free water   Ocuvite daily for wound healing (provides zinc, vitamin A, vitamin C, Vitamin E, copper, and selenium)  Rena-vite daily per tube   Vitamin C 500m19mD per tube  Pt at high refeed risk; recommend monitor K, Mg and P labs daily until stable  NUTRITION DIAGNOSIS:   Severe Malnutrition related to chronic illness (CHF, COPD, CABG, ESRD on HD, chronic pancreatits) as evidenced by severe fat depletion, severe muscle depletion, 30 percent weight loss in 1 year and 15 percent weight loss in 3 months.  GOAL:   Patient will meet greater than or equal to 90% of their needs -not met   MONITOR:   PO intake, Supplement acceptance, Weight trends, Labs, Skin, I & O's  ASSESSMENT:   69 y5. female with medical history significant for s/p CABG and DES, s/p mitral valve repair, ESRD on TTS HD, atrial fibrillation s/p Maze procedure 2012 not on anticoagulation, chronic diastolic CHF (EF [id number] 05/2018), PAD s/p b/l renal artery stents and left subclavian stent, AAA s/p endovascular repair 2019, chronic pancreatitis on chronic narcotics, COPD on 2 L supplemental O2 via [place] at night, HTN, HLD, RBBB, and depression, who presents to the emergency room with a several day history of weakness and poor oral intake   Pt s/p I & D 8/15  Pt s/p EGD/colonoscopy 8/25; pt found to have esophagitis, esophageal ulcers, gastric ulcers, colon polyps, anal lipoma.   Pt continues to have poor appetite and oral intake. Pt unable to have G-tube placed r/t gastric ulcers. Pt s/p nasogastric tube placement today; plan is for short term nutrition support to see if there is any clinical improvement,  otherwise pt will be referred to hospice/palliative care. Pt is at high refeed risk. Per chart, pt up ~10lbs since admit; pt is ordered for daily weights.   Medications reviewed and include: allopurinol, vitamin C, epoetin, heparin, creon, rena-vite, megace, ocuvite, nicotine, protonix, senokot, NaCl w/ 5% dextrose _1 /hr  Labs reviewed: K 4.0 wnl, creat 1.80(H), P 3.0 wnl, Mg 1.7 wnl  Wbc- 15.4(H), Hgb 8.8(L), Hct 26.1(L)  Diet Order:   Diet Order            Diet regular Room service appropriate? Yes; Fluid consistency: Thin  Diet effective now                EDUCATION NEEDS:   Education needs have been addressed  Skin:  Skin Assessment: Reviewed RN Assessment (Right outer ankle unstageable pressure injury; 1X1cm, Right buttock with unstageable pressure injury; 4X4.5X.3cm,)  Last BM:  8/27- type 6  Height:   Ht Readings from Last 1 Encounters:  04/20/20 _2  (1.651 m)    Weight:   Wt Readings from Last 1 Encounters:  05/01/20 42.9 kg    Ideal Body Weight:  56.8 kg  BMI:  Body mass index is 15.74 kg/m.  Estimated Nutritional Needs:   Kcal:  1300-1500kcal/day  Protein:  65-75g/day  Fluid:  UOP +1L  CaseKoleen Distance RD, LDN Please refer to [facility] and/or RD on-call/weekend/after hours pager

## 2020-05-02 NOTE — Progress Notes (Signed)
OT Cancellation Note  Patient Details Name: [name] MRN: [id number] DOB: 09/26/1950   Cancelled Treatment:    Reason Eval/Treat Not Completed: Fatigue/lethargy limiting ability to participate;Patient declined, no reason specified. Consult received, chart reviewed. Pt and 2 daughters in room. Pt fatigued, politely declines OT evaluation this afternoon. Pt/family agreeable to re-attempt next date, ideally before 9am (scheduled for dialysis tomorrow, time unknown).   Jeni Salles, MPH, MS, OTR/L ascom [phone] 05/02/20, 4:46 PM

## 2020-05-03 LAB — CBC WITH DIFFERENTIAL/PLATELET
Abs Immature Granulocytes: 0.15 10*3/uL — ABNORMAL HIGH (ref 0.00–0.07)
Basophils Absolute: 0 10*3/uL (ref 0.0–0.1)
Basophils Relative: 0 %
Eosinophils Absolute: 0 10*3/uL (ref 0.0–0.5)
Eosinophils Relative: 0 %
HCT: 27.8 % — ABNORMAL LOW (ref 36.0–46.0)
Hemoglobin: 9.5 g/dL — ABNORMAL LOW (ref 12.0–15.0)
Immature Granulocytes: 1 %
Lymphocytes Relative: 2 %
Lymphs Abs: 0.3 10*3/uL — ABNORMAL LOW (ref 0.7–4.0)
MCH: 35.2 pg — ABNORMAL HIGH (ref 26.0–34.0)
MCHC: 34.2 g/dL (ref 30.0–36.0)
MCV: 103 fL — ABNORMAL HIGH (ref 80.0–100.0)
Monocytes Absolute: 0.6 10*3/uL (ref 0.1–1.0)
Monocytes Relative: 4 %
Neutro Abs: 13.4 10*3/uL — ABNORMAL HIGH (ref 1.7–7.7)
Neutrophils Relative %: 93 %
Platelets: 168 10*3/uL (ref 150–400)
RBC: 2.7 MIL/uL — ABNORMAL LOW (ref 3.87–5.11)
RDW: 16.1 % — ABNORMAL HIGH (ref 11.5–15.5)
WBC: 14.4 10*3/uL — ABNORMAL HIGH (ref 4.0–10.5)
nRBC: 0 % (ref 0.0–0.2)

## 2020-05-03 LAB — BASIC METABOLIC PANEL
Anion gap: 8 (ref 5–15)
BUN: 14 mg/dL (ref 8–23)
CO2: 26 mmol/L (ref 22–32)
Calcium: 7.6 mg/dL — ABNORMAL LOW (ref 8.9–10.3)
Chloride: 100 mmol/L (ref 98–111)
Creatinine, Ser: 2.22 mg/dL — ABNORMAL HIGH (ref 0.44–1.00)
GFR calc Af Amer: 26 mL/min — ABNORMAL LOW (ref >60)
GFR calc non Af Amer: 22 mL/min — ABNORMAL LOW (ref >60)
Glucose, Bld: 190 mg/dL — ABNORMAL HIGH (ref 70–99)
Potassium: 3.9 mmol/L (ref 3.5–5.1)
Sodium: 134 mmol/L — ABNORMAL LOW (ref 135–145)

## 2020-05-03 LAB — MAGNESIUM: Magnesium: 2 mg/dL (ref 1.7–2.4)

## 2020-05-03 LAB — GLUCOSE, CAPILLARY
Glucose-Capillary: 171 mg/dL — ABNORMAL HIGH (ref 70–99)
Glucose-Capillary: 126 mg/dL — ABNORMAL HIGH (ref 70–99)
Glucose-Capillary: 148 mg/dL — ABNORMAL HIGH (ref 70–99)

## 2020-05-03 LAB — PHOSPHORUS: Phosphorus: 3.3 mg/dL (ref 2.5–4.6)

## 2020-05-03 NOTE — Progress Notes (Signed)
OT Cancellation Note  Patient Details Name: [name] MRN: [id number] DOB: 1951/04/25   Cancelled Treatment:    Reason Eval/Treat Not Completed: Patient at procedure or test/ unavailable. Upon arrival pt found to be out of the room, will follow up at later date/time as able.   Dessie Coma, M.S. OTR/L  05/03/20, 1:33 PM  ascom [phone]

## 2020-05-03 NOTE — Progress Notes (Signed)
PROGRESS NOTE    [name]  LYY:[id number] DOB: Sep 15, 1950 DOA: 04/19/2020 PCP: Bernerd Limbo, MD   Chief complaint.  Anorexia.  Brief Narrative: Briefly this is a 69 year old Caucasian female with extensive past medical history of ESRD on hemodialysis TTS, CAD status post CABG, status post mitral valve repair, atrial fibrillation on anticoagulation, chronic diastolic congestive heart failure, PAD, bilateral renal artery stenosis, severe protein calorie malnutrition, chronic pancreatitis, chronic narcotic use, COPD, hypertension, depression.  She presented to the emergency department yesterday with weakness, generalized pain, acute on chronic pain of the right buttock ulcer. The patient's been followed by wound care clinic. However the wound had worsened and pain worsened to the point that the patient presented to the emergency department. She is also markedly malnourished weight has dropped to 37.6 kg. BMI 13.8.  8/26.EGD was performed yesterday showed diffuse esophagitisand gastric ulcer. Colonoscopy showed multiple colonic polyps, polypectomy performed.  8/27.  Discussed with patient daughter and the patient herself, will place Dobbhoff for tube feeding.  8/28.  Patient is tolerating tube feeding.   Assessment & Plan:   Principal Problem:   Inadequate oral intake Active Problems:   Essential hypertension   Atrial fibrillation (HCC)   Chronic diastolic heart failure (HCC)   COPD with chronic bronchitis (HCC)   S/P CABG x 3   Diabetes mellitus (HCC)   Chronic kidney disease (CKD), stage IV (severe) (HCC)   Weakness   Severe protein-calorie malnutrition (HCC)   Pressure injury, stage 4 (HCC)   ESRD on hemodialysis (HCC)   Weight loss, unintentional   UTI (urinary tract infection)   Infected wound   Failure to thrive in adult   Pressure injury of sacral region, stage 1   Esophageal dysphagia   Acute esophagitis   Acute gastric ulcer without hemorrhage or  perforation   Polyp of ascending colon   Lipoma of colon  #1.  Anorexia, severe protein calorie malnutrition, adult failure to thrive and chronic pancreatic insufficiency. Patient currently taking Creon, Megace.  Continue tube feeding, patient was able to eat small amount of food yesterday in addition to tube feeding.  Monitor electrolytes and phosphorus level.  2.  Infected right buttock wound, right ankle wound. Secondary to severe protein calorie malnutrition. Completed antibiotics.  3.  Type 2 diabetes with hypoglycemia. Glucose better after tolerating tube feeding.  Discontinued D5.  4.  End-stage renal disease on hemodialysis. Followed by nephrology.  5.  Urinary tract infection.  Completed antibiotics.  6.  Atrial fibrillation. Not chronically on anticoagulation.  7.  Chronic diastolic congestive heart failure. Stable per  8.  Anemia chronic kidney disease. Stable.  9.  Esophagitis and a gastric ulcer. Continue PPI.       DVT prophylaxis:Heparin Code Status:Full Family Communication:Discussed with daughter, all questions answered. Disposition Plan:  Patient came from:Home  Anticipated d/c place:SNF  Barriers to d/c OR conditions which need to be met to effect a safe d/c:   Consultants:  GI  Procedures:EGD andcolonoscopy Antimicrobials: None    Subjective: Patient slept well last night.  She tolerating tube feeding without nausea vomiting abdominal pain. Denies any short of breath or cough.  Objective: Vitals:   05/02/20 0510 05/02/20 1130 05/02/20 2109 05/03/20 0440  BP: (!) 107/59 (!) 124/44 (!) 121/46 (!) 127/46  Pulse: (!) 107 (!) 107 94 94  Resp: _0 Temp: 98.5 F (36.9 C) 99 F (37.2 C) 99.8 F (37.7 C) 99.6 F (37.6 C)  TempSrc: Oral Oral  Oral Oral  SpO2: 92% 95% 96% 93%  Weight:        Height:        Intake/Output Summary (Last 24 hours) at 05/03/2020 0936 Last data filed at 05/03/2020 0528 Gross per 24 hour  Intake 867.33 ml  Output 0 ml  Net 867.33 ml   Filed Weights   04/20/20 2033 04/30/20 0500 05/01/20 0500  Weight: 36.7 kg 43.6 kg 42.9 kg    Examination:  General exam: Appears calm and comfortable.  Ill-appearing and malnourished Respiratory system: Clear to auscultation. Respiratory effort normal. Cardiovascular system: Regular. No JVD, murmurs, rubs, gallops or clicks. No pedal edema. Gastrointestinal system: Abdomen is nondistended, soft and nontender. No organomegaly or masses felt. Normal bowel sounds heard. Central nervous system: Alert and oriented x3. No focal neurological deficits. Extremities: Symmetric  Skin: No rashes, lesions or ulcers Psychiatry: Mood & affect appropriate.     Data Reviewed: I have personally reviewed following labs and imaging studies  CBC: Recent Labs  Lab 04/29/20 0418 04/30/20 0731 05/01/20 0526 05/02/20 0613 05/03/20 0602  WBC 13.3* 10.9* 8.6 15.4* 14.4*  NEUTROABS  --   --   --   --  13.4*  HGB 11.2* 8.9* 8.6* 8.8* 9.5*  HCT 31.7* 26.4* 25.1* 26.1* 27.8*  MCV 101.3* 105.2* 103.3* 104.8* 103.0*  PLT 192 147* 130* 149* 893   Basic Metabolic Panel: Recent Labs  Lab 04/29/20 0418 04/29/20 0418 04/30/20 0731 05/01/20 0526 05/01/20 1001 05/02/20 0613 05/03/20 0602  NA 136  --  136 138  --  139 134*  K 4.5  --  4.7 4.4  --  4.0 3.9  CL 99  --  102 106  --  106 100  CO2 26  --  26 24  --  30 26  GLUCOSE 134*  --  67* 124*  --  89 190*  BUN 14  --  10 11  --  8 14  CREATININE 2.19*  --  1.88* 2.27*  --  1.80* 2.22*  CALCIUM 7.9*  --  7.8* 7.6*  --  7.9* 7.6*  MG 2.0  --  1.6* 2.0  --  1.7 2.0  PHOS 4.8*   < > 3.8 4.4 1.2* 3.0 3.3   < > = values in this interval not displayed.   GFR: Estimated Creatinine Clearance: 16.7 mL/min (A) (by C-G formula based on SCr of 2.22 mg/dL (H)). Liver Function  Tests: No results for input(s): AST, ALT, ALKPHOS, BILITOT, PROT, ALBUMIN in the last 168 hours. No results for input(s): LIPASE, AMYLASE in the last 168 hours. No results for input(s): AMMONIA in the last 168 hours. Coagulation Profile: No results for input(s): INR, PROTIME in the last 168 hours. Cardiac Enzymes: No results for input(s): CKTOTAL, CKMB, CKMBINDEX, TROPONINI in the last 168 hours. BNP (last 3 results) No results for input(s): PROBNP in the last 8760 hours. HbA1C: No results for input(s): HGBA1C in the last 72 hours. CBG: Recent Labs  Lab 05/02/20 0731 05/02/20 1128 05/02/20 1654 05/02/20 2119 05/03/20 0758  GLUCAP 97 108* 88 163* 171*   Lipid Profile: No results for input(s): CHOL, HDL, LDLCALC, TRIG, CHOLHDL, LDLDIRECT in the last 72 hours. Thyroid Function Tests: No results for input(s): TSH, T4TOTAL, FREET4, T3FREE, THYROIDAB in the last 72 hours. Anemia Panel: No results for input(s): VITAMINB12, FOLATE, FERRITIN, TIBC, IRON, RETICCTPCT in the last 72 hours. Sepsis Labs: No results for input(s): PROCALCITON, LATICACIDVEN in the last 168 hours.  No results  found for this or any previous visit (from the past 240 hour(s)).       Radiology Studies: DG Abd 1 View  Result Date: 05/02/2020 CLINICAL DATA:  Feeding tube placement EXAM: ABDOMEN - 1 VIEW COMPARISON:  Portable exam 1110 hours compared to 04/29/2020 FINDINGS: Tip of feeding tube projects over pylorus/duodenal bulb region. Air-filled small bowel loops with paucity of colonic gas question ileus versus obstruction. Atherosclerotic calcifications with prior endoluminal stenting of aorta. Bibasilar infiltrates. IMPRESSION: Tip of feeding tube projects over the region of the pylorus/duodenal bulb. Electronically Signed   By: Lavonia Dana M.D.   On: 05/02/2020 11:26        Scheduled Meds: . allopurinol  100 mg Per Tube QHS  . amitriptyline  10 mg Per Tube QHS  . vitamin C  500 mg Per Tube BID  .  atorvastatin  40 mg Per Tube QHS  . collagenase   Topical Daily  . epoetin (EPOGEN/PROCRIT) injection  4,000 Units Intravenous Q T,Th,Sa-HD  . feeding supplement (OSMOLITE 1.5 CAL)  1,000 mL Per Tube Q24H  . feeding supplement (PROSource TF)  45 mL Per Tube Daily  . free water  30 mL Per Tube Q4H  . gabapentin  400 mg Per Tube TID  . heparin  5,000 Units Subcutaneous Q8H  . lipase/protease/amylase  24,000 Units Oral TID  . megestrol  400 mg Per Tube Daily  . metoprolol tartrate  12.5 mg Per Tube BID  . multivitamin  1 tablet Per Tube QHS  . multivitamin-lutein  1 capsule Per Tube Daily  . nicotine  21 mg Transdermal Daily  . pantoprazole (PROTONIX) IV  40 mg Intravenous Q12H  . polyethylene glycol-electrolytes  2,000 mL Oral Once  . senna-docusate  1 tablet Per Tube BID  . sertraline  50 mg Per Tube Daily  . traZODone  150 mg Per Tube QHS   Continuous Infusions:   LOS: 13 days    Time spent: 28 minutes    Sharen Hones, MD [facility]   To contact the attending provider between 7A-7P or the covering provider during after hours 7P-7A, please log into the web site [email or web address] and access using universal [place] password for that web site. If you do not have the password, please call the hospital operator.  05/03/2020, 9:36 AM

## 2020-05-03 NOTE — Plan of Care (Signed)
The patient refused to eat breakfast this morning. Continuous tube feeds running at 17ml/hr with 59ml flush Q4. The patient got up this morning to use the bedside commode with 2 person assist. Wound care has been provided. No falls. The patient is currently at dialysis. Dialysis days TTS.  Problem: Education: Goal: Knowledge of General Education information will improve Description: Including pain rating scale, medication(s)/side effects and non-pharmacologic comfort measures Outcome: Progressing   Problem: Health Behavior/Discharge Planning: Goal: Ability to manage health-related needs will improve Outcome: Progressing   Problem: Clinical Measurements: Goal: Ability to maintain clinical measurements within normal limits will improve Outcome: Progressing Goal: Will remain free from infection Outcome: Progressing Goal: Diagnostic test results will improve Outcome: Progressing Goal: Respiratory complications will improve Outcome: Progressing Goal: Cardiovascular complication will be avoided Outcome: Progressing   Problem: Activity: Goal: Risk for activity intolerance will decrease Outcome: Progressing   Problem: Nutrition: Goal: Adequate nutrition will be maintained Outcome: Progressing   Problem: Coping: Goal: Level of anxiety will decrease Outcome: Progressing   Problem: Elimination: Goal: Will not experience complications related to bowel motility Outcome: Progressing Goal: Will not experience complications related to urinary retention Outcome: Progressing   Problem: Pain Managment: Goal: General experience of comfort will improve Outcome: Progressing   Problem: Safety: Goal: Ability to remain free from injury will improve Outcome: Progressing   Problem: Skin Integrity: Goal: Risk for impaired skin integrity will decrease Outcome: Progressing

## 2020-05-03 NOTE — Consult Note (Signed)
[place] for Electrolyte Monitoring and Replacement   Recent Labs: Potassium (mmol/L)  Date Value  05/03/2020 3.9   Magnesium (mg/dL)  Date Value  05/03/2020 2.0   Calcium (mg/dL)  Date Value  05/03/2020 7.6 (L)   Albumin (g/dL)  Date Value  04/24/2020 1.6 (L)   Phosphorus (mg/dL)  Date Value  05/03/2020 3.3   Sodium  Date Value  05/03/2020 134 mmol/L (L)  07/03/2018 143   Corrected Ca:  9.82 mg/dL Add on Phos: 3.0 mg/dL  Assessment: 69 year old female presented with weakness, buttock ulcer, and inadequate oral intake. Pt has a PMH of ESRD on HD TTS, CAD s/p CABG, a.fib, mitral valve repair, dCHF, PAD, bilateral renal artery stenosis, severe protein calorie malnutrition, chronic pancreatitis, COPD, HTN, and depression. Pt has a 2-year history of unintentional weight loss + poor appetite. Per MD, plan to place Dobbhoff and start tube feeds.   Goal of Therapy (given cardiac history):  Magnesium 2.0 - 2.4 mg/dL Potassium 4.0 - 5.1 mmol/L All Other Electrolytes WNL  Plan:   No replacement needed at this time.   Monitor electrolytes with AM labs   Oswald Hillock, PharmD, BCPS 05/03/2020 7:55 AM

## 2020-05-03 NOTE — Progress Notes (Signed)
[name]  MRN: [id number]  DOB/AGE: 11/18/1950 69 y.o.  Primary Care Physician:Bouska, Shanon Brow, MD  Admit date: 04/19/2020  Chief Complaint:  Chief Complaint  Patient presents with  . Weakness  . Pressure Ulcer    S-Pt presented on  04/19/2020 with  Chief Complaint  Patient presents with  . Weakness  . Pressure Ulcer  . Patient seen on dialysis. Patient tolerating treatment well  Medications . allopurinol  100 mg Per Tube QHS  . amitriptyline  10 mg Per Tube QHS  . vitamin C  500 mg Per Tube BID  . atorvastatin  40 mg Per Tube QHS  . collagenase   Topical Daily  . epoetin (EPOGEN/PROCRIT) injection  4,000 Units Intravenous Q T,Th,Sa-HD  . feeding supplement (OSMOLITE 1.5 CAL)  1,000 mL Per Tube Q24H  . feeding supplement (PROSource TF)  45 mL Per Tube Daily  . free water  30 mL Per Tube Q4H  . gabapentin  400 mg Per Tube TID  . heparin  5,000 Units Subcutaneous Q8H  . lipase/protease/amylase  24,000 Units Oral TID  . megestrol  400 mg Per Tube Daily  . metoprolol tartrate  12.5 mg Per Tube BID  . multivitamin  1 tablet Per Tube QHS  . multivitamin-lutein  1 capsule Per Tube Daily  . nicotine  21 mg Transdermal Daily  . pantoprazole (PROTONIX) IV  40 mg Intravenous Q12H  . polyethylene glycol-electrolytes  2,000 mL Oral Once  . senna-docusate  1 tablet Per Tube BID  . sertraline  50 mg Per Tube Daily  . traZODone  150 mg Per Tube QHS         [id number] from the symptoms mentioned above,there are no other symptoms referable to all systems reviewed.  Physical Exam: Vital signs in last 24 hours: Temp:  [99 F (37.2 C)-99.8 F (37.7 C)] 99.6 F (37.6 C) (08/28 0440) Pulse Rate:  [94-107] 94 (08/28 0440) Resp:  [16] 16 (08/28 0440) BP: (121-127)/(44-46) 127/46 (08/28 0440) SpO2:  [93 %-96 %] 93 % (08/28 0440) Weight change:  Last BM Date: 05/02/20  Intake/Output from previous day: 08/27 0701 - 08/28 0700 In: 867.3 [P.O.:240; NG/GT:627.3] Out: 0  No  intake/output data recorded.   Physical Exam: General- pt is awake,alert, oriented to time place and person Resp- No acute REsp distress, CTA B/L NO Rhonchi CVS- S1S2 regular in rate and rhythm GIT- BS+, soft, NT, ND EXT- NO LE Edema, Cyanosis Access-left AV fistula Two needles in situ  Patient access arm had a skin tear noticed during examination   Lab Results: CBC Recent Labs    05/02/20 0613 05/03/20 0602  WBC 15.4* 14.4*  HGB 8.8* 9.5*  HCT 26.1* 27.8*  PLT 149* 168    BMET Recent Labs    05/02/20 0613 05/03/20 0602  NA 139 134*  K 4.0 3.9  CL 106 100  CO2 30 26  GLUCOSE 89 190*  BUN 8 14  CREATININE 1.80* 2.22*  CALCIUM 7.9* 7.6*    MICRO No results found for this or any previous visit (from the past 240 hour(s)).    Lab Results  Component Value Date   PTH 80 (H) 08/15/2018   CALCIUM 7.6 (L) 05/03/2020   CAION 1.10 (L) 03/21/2018   PHOS 3.3 05/03/2020               Impression:  69 y.o. female with medical problems of ESRD, on hemodialysis, CAD/CABG 03/2011, COPD, atrial fibrillation, hypertension, COPD, chronic pancreatitis and  chronic abdominal pain requiring recurrent hospitalizations , h/o AAA, cholecystectomy, severe peripheral arterial disease including bilateral renal artery stent, subclavian artery stenosis, left, left CEA  was admitted on 04/19/2020 for generalized weakness and pain.  Principal Problem:   Inadequate oral intake Active Problems:   Essential hypertension   Atrial fibrillation (HCC)   Chronic diastolic heart failure (HCC)   COPD with chronic bronchitis (HCC)   S/P CABG x 3   Diabetes mellitus (HCC)   Chronic kidney disease (CKD), stage IV (severe) (HCC)   Weakness   Severe protein-calorie malnutrition (HCC)   Pressure injury, stage 4 (HCC)   ESRD on hemodialysis (HCC)   Weight loss, unintentional   UTI (urinary tract infection)   Infected wound   Failure to thrive in adult   Pressure injury of sacral  region, stage 1   Esophageal dysphagia   Acute esophagitis   Acute gastric ulcer without hemorrhage or perforation   Polyp of ascending colon   Lipoma of colon  Infected wound [T14.8XXA, L08.9] Weakness [R53.1] Failure to thrive in adult [R62.7] Inadequate oral intake [R63.8] Acute cystitis with hematuria [N30.01] Wound of right buttock, initial encounter [S31.819A] Pressure injury of sacral region, stage 1 [facility]/TTS/left arm AV fistula/[facility] [place]   1)Renal End-stage renal disease-patient is on hemodialysis on Tuesday Thursday Saturday schedule Patient goes to dialysis under [facility] renal schedule group at [facility] located on [address] through left arm fistula  Patient will be dialyzed today   2)HTN Blood pressure is now stable  3)Anemia of chronic disease  HGb at goal (9--11) We will keep patient on Epogen during dialysis  4) secondary hyperparathyroidism-CKD Mineral-Bone Disorder Secondary hyperparathyroidism-present  Patient phosphorus is stable No need for binders  5) urinary tract infection Patient treated with antibiotics   6) electrolytes  Normokalemic  Hyponatremia Secondary to ESRD Secondary to inability to get into free water   7)Acid base Co2 at goal  8)  right buttock wound Patient is status post I&D This is being closely followed by the primary team/wound care team  9)Inadequate oral intake This is secondary to multiple factors Primary team is following. Dietary has been consulted Patient is being considered for PEG tube placement    Plan:  We will continue current care     Cain Fitzhenry s Theador Hawthorne 05/03/2020, 9:16 AM

## 2020-05-04 ENCOUNTER — Inpatient Hospital Stay: Payer: Medicare Other

## 2020-05-04 LAB — BASIC METABOLIC PANEL
Anion gap: 13 (ref 5–15)
BUN: 14 mg/dL (ref 8–23)
CO2: 25 mmol/L (ref 22–32)
Calcium: 7.8 mg/dL — ABNORMAL LOW (ref 8.9–10.3)
Chloride: 94 mmol/L — ABNORMAL LOW (ref 98–111)
Creatinine, Ser: 1.89 mg/dL — ABNORMAL HIGH (ref 0.44–1.00)
GFR calc Af Amer: 31 mL/min — ABNORMAL LOW (ref >60)
GFR calc non Af Amer: 27 mL/min — ABNORMAL LOW (ref >60)
Glucose, Bld: 165 mg/dL — ABNORMAL HIGH (ref 70–99)
Potassium: 3.8 mmol/L (ref 3.5–5.1)
Sodium: 132 mmol/L — ABNORMAL LOW (ref 135–145)

## 2020-05-04 LAB — PHOSPHORUS: Phosphorus: 2.2 mg/dL — ABNORMAL LOW (ref 2.5–4.6)

## 2020-05-04 LAB — GLUCOSE, CAPILLARY: Glucose-Capillary: 192 mg/dL — ABNORMAL HIGH (ref 70–99)

## 2020-05-04 LAB — MAGNESIUM: Magnesium: 1.9 mg/dL (ref 1.7–2.4)

## 2020-05-04 MED ORDER — LORAZEPAM 2 MG/ML IJ SOLN
1.0000 mg | INTRAMUSCULAR | Status: DC | PRN
  Administered 2020-05-05: 1 mg via INTRAVENOUS
  Filled 2020-05-04: qty 1

## 2020-05-04 MED ORDER — LORAZEPAM 2 MG/ML PO CONC: 1.0000 mg | ORAL | Status: DC | PRN

## 2020-05-04 MED ORDER — PANTOPRAZOLE SODIUM 40 MG PO TBEC
40.0000 mg | DELAYED_RELEASE_TABLET | Freq: Two times a day (BID) | ORAL | Status: DC
  Administered 2020-05-04: 40 mg via ORAL
  Filled 2020-05-04: qty 1

## 2020-05-04 MED ORDER — GLYCOPYRROLATE 1 MG PO TABS
1.0000 mg | ORAL_TABLET | ORAL | Status: DC | PRN
  Filled 2020-05-04: qty 1

## 2020-05-04 MED ORDER — LORAZEPAM 1 MG PO TABS: 1.0000 mg | ORAL_TABLET | ORAL | Status: DC | PRN

## 2020-05-04 MED ORDER — MORPHINE SULFATE (CONCENTRATE) 10 MG/0.5ML PO SOLN
5.0000 mg | ORAL | Status: DC | PRN
  Administered 2020-05-04 – 2020-05-05 (×4): 5 mg via SUBLINGUAL
  Filled 2020-05-04 (×4): qty 0.5

## 2020-05-04 MED ORDER — K PHOS MONO-SOD PHOS DI & MONO 155-852-130 MG PO TABS
500.0000 mg | ORAL_TABLET | ORAL | Status: AC
  Administered 2020-05-04 (×2): 500 mg via ORAL
  Filled 2020-05-04 (×2): qty 2

## 2020-05-04 MED ORDER — GLYCOPYRROLATE 0.2 MG/ML IJ SOLN
0.2000 mg | INTRAMUSCULAR | Status: DC | PRN
  Filled 2020-05-04: qty 1

## 2020-05-04 MED ORDER — MORPHINE SULFATE (CONCENTRATE) 10 MG/0.5ML PO SOLN: 5.0000 mg | ORAL | Status: DC | PRN

## 2020-05-04 MED ORDER — GLYCOPYRROLATE 0.2 MG/ML IJ SOLN
0.2000 mg | INTRAMUSCULAR | Status: DC | PRN
  Filled 2020-05-04 (×2): qty 1

## 2020-05-04 MED ORDER — MIDODRINE HCL 5 MG PO TABS
20.0000 mg | ORAL_TABLET | ORAL | Status: DC | PRN
  Filled 2020-05-04: qty 4

## 2020-05-04 NOTE — Consult Note (Signed)
[facility] for Electrolyte Monitoring and Replacement   Recent Labs: Potassium (mmol/L)  Date Value  05/04/2020 3.8   Magnesium (mg/dL)  Date Value  05/04/2020 1.9   Calcium (mg/dL)  Date Value  05/04/2020 7.8 (L)   Albumin (g/dL)  Date Value  04/24/2020 1.6 (L)   Phosphorus (mg/dL)  Date Value  05/04/2020 2.2 (L)   Sodium  Date Value  05/04/2020 132 mmol/L (L)  07/03/2018 143   Corrected Ca:  9.82 mg/dL Add on Phos: 3.0 mg/dL  Assessment: 69 year old female presented with weakness, buttock ulcer, and inadequate oral intake. Pt has a PMH of ESRD on HD TTS, CAD s/p CABG, a.fib, mitral valve repair, dCHF, PAD, bilateral renal artery stenosis, severe protein calorie malnutrition, chronic pancreatitis, COPD, HTN, and depression. Pt has a 2-year history of unintentional weight loss + poor appetite. Per MD, plan to place [place] and start tube feeds.   Goal of Therapy (given cardiac history):  Magnesium 2.0 - 2.4 mg/dL Potassium 4.0 - 5.1 mmol/L All Other Electrolytes WNL  Plan:   Will give Kphos 2 tabs x 2.   Monitor electrolytes with AM labs   Oswald Hillock, PharmD, BCPS 05/04/2020 7:42 AM

## 2020-05-04 NOTE — Evaluation (Signed)
Occupational Therapy Evaluation Patient Details Name: [name] MRN: [id number] DOB: 08/07/1951 Today's Date: 05/04/2020    History of Present Illness Pt presented to ER secondary to decreased PO intake, progressive weakness and inability to get OOB/mobilize in home environment; admitted for management of inadequate oral intake, UTI and sacral decub (s/p bedside debridement)   Clinical Impression   [name] was seen for OT evaluation this date. Prior to hospital admission, pt was MOD I for mobility and ADLs using 4WW. Pt lives alone c 2 daughters available PRN (1 local and 1 about an hour away). Pt presents to acute OT demonstrating impaired ADL performance and functional mobility 2/2 decreased initiation, functional strength/ROM deficits, decreased activity tolerance, nausea, and decreased LB access.   Upon arrival, daughter in room reports pt is fatigued and nauseated, has had no p.o. intake last 24 hours. Pt is minimally conversive c OT and daughter in room but responds c increased time. Pt currently requires MAX A x2 rolling at bed level for toileting and TOTAL A for perihygiene in sidelying. Pt positioned to modified bed-in-chair c pillows under BUE, demonstrates grossly 3-/5 BUE strength. Pt would benefit from skilled OT to address noted impairments and functional limitations (see below for any additional details) in order to maximize safety and independence while minimizing falls risk and caregiver burden. Upon hospital discharge, recommend STR to maximize pt safety and return to PLOF. If pt discharges home will require hospital bed.      Follow Up Recommendations  SNF;Supervision/Assistance - 24 hour    Equipment Recommendations  Hospital bed    Recommendations for Other Services       Precautions / Restrictions Precautions Precautions: Fall Precaution Comments: sacral decub, R lateral heel wound Restrictions Weight Bearing Restrictions: No      Mobility Bed  Mobility Overal bed mobility: Needs Assistance Bed Mobility: Rolling Rolling: Max assist;+2 for physical assistance     General bed mobility comments: MAX A x2 rolling bed level for toileting - pt fatigued nad has not eaten in ~36 hours   Transfers    General transfer comment: not tested     ADL either performed or assessed with clinical judgement   ADL Overall ADL's : Needs assistance/impaired      General ADL Comments: TOTAL A toileting at bed level including perihygiene. MAX VCs  oral access bed-in-chair position.      Pertinent Vitals/Pain Pain Assessment: Faces Faces Pain Scale: Hurts a little bit Pain Location: nauseous Pain Descriptors / Indicators: Discomfort Pain Intervention(s): Limited activity within patient's tolerance;Patient requesting pain meds-RN notified;Monitored during session     Hand Dominance Right   Extremity/Trunk Assessment Upper Extremity Assessment Upper Extremity Assessment: Generalized weakness (3-/5 B grip)   Lower Extremity Assessment Lower Extremity Assessment: Generalized weakness       Communication Communication Communication: No difficulties   Cognition Arousal/Alertness: Awake/alert Behavior During Therapy: WFL for tasks assessed/performed Overall Cognitive Status: Within Functional Limits for tasks assessed      General Comments: Fatigued and nauseated, minimally conversive c OT and daughter in room    General Comments       Exercises Exercises: Other exercises Other Exercises Other Exercises: Pt and family educated re: OT role, DME recs, d/c recs, importance of positioning and mobility for functional strengthening Other Exercises: Toileting, oral care, rolling, bed in chair position   Shoulder Instructions      Home Living Family/patient expects to be discharged to:: Private residence Living Arrangements: Alone Available Help at Discharge:  Family;Available PRN/intermittently Type of Home: House Home Access:  Ramped entrance     Home Layout: One level         Bathroom Toilet: Handicapped height Bathroom Accessibility: Yes   Home Equipment: Walker - 4 wheels;Bedside commode;Transport chair;Grab bars - tub/shower   Additional Comments: uses BSC over toilet      Prior Functioning/Environment Level of Independence: Independent with assistive device(s)        Comments: MOD I ADLs, household mobilization with 4WW; does endorse progressive decline in recent weeks        OT Problem List: Decreased strength;Decreased range of motion;Decreased activity tolerance;Impaired balance (sitting and/or standing);Decreased safety awareness      OT Treatment/Interventions: Self-care/ADL training;Therapeutic exercise;Energy conservation;DME and/or AE instruction;Therapeutic activities;Patient/family education;Balance training    OT Goals(Current goals can be found in the care plan section) Acute Rehab OT Goals Patient Stated Goal: to eat more OT Goal Formulation: With family Time For Goal Achievement: 05/18/20 Potential to Achieve Goals: Fair ADL Goals Pt Will Perform Eating: with set-up;with supervision;bed level (c MIN VCs for encouragement to eat) Pt Will Perform Upper Body Bathing: with set-up;sitting;with min assist Pt Will Transfer to Toilet: with min assist (rolling at bed level)  OT Frequency: Min 1X/week   Barriers to D/C: Inaccessible home environment;Decreased caregiver support          AM-PAC OT "6 Clicks" Daily Activity     Outcome Measure Help from another person eating meals?: A Little Help from another person taking care of personal grooming?: A Little Help from another person toileting, which includes using toliet, bedpan, or urinal?: Total Help from another person bathing (including washing, rinsing, drying)?: A Lot Help from another person to put on and taking off regular upper body clothing?: A Lot Help from another person to put on and taking off regular lower body  clothing?: A Lot 6 Click Score: 13   End of Session    Activity Tolerance: Patient limited by fatigue Patient left: in bed;with call bell/phone within reach;with family/visitor present;with nursing/sitter in room (MD, RN, and daughter in room end of session)  OT Visit Diagnosis: Other abnormalities of gait and mobility (R26.89);Muscle weakness (generalized) (M62.81);Adult, failure to thrive (R62.7)                Time: 4970-2637 OT Time Calculation (min): 24 min Charges:  OT General Charges $OT Visit: 1 Visit OT Evaluation $OT Eval Low Complexity: 1 Low OT Treatments $Self Care/Home Management : 8-22 mins  Dessie Coma, M.S. OTR/L  05/04/20, 10:35 AM  ascom [phone]

## 2020-05-04 NOTE — Progress Notes (Signed)
Chaplain responded to an order transition request. Chaplain met a daughter and several grandchildren of Pt as they were exiting the room.   Daughter asked Chaplain if he would pray with them and Mother. Chaplain acknowledged Pt upon entering the room. Pt acknowledged she would like a prayer but did not respond to any other questions.  Chaplain provided emotional-spiritual care by inviting family members to name what he should keep in mind when praying with them. Family members shared that they had "received bad news." Family members shared sweet stories about the patient and their love for her.  [place] Carlota Raspberry  Staff Chaplain-Relief

## 2020-05-04 NOTE — Progress Notes (Addendum)
[name]  MRN: [id number]  DOB/AGE: Oct 10, 1950 69 y.o.  Primary Care Physician:Bouska, Shanon Brow, MD  Admit date: 04/19/2020  Chief Complaint:  Chief Complaint  Patient presents with  . Weakness  . Pressure Ulcer    S-Pt presented on  04/19/2020 with  Chief Complaint  Patient presents with  . Weakness  . Pressure Ulcer  . Patient laying on the bed. Patient does not offer any specific complaints. On discussion with the hospitalist there was a mention of patient getting into fluid overload We will ask for chest x-ray  Medications . allopurinol  100 mg Per Tube QHS  . amitriptyline  10 mg Per Tube QHS  . vitamin C  500 mg Per Tube BID  . atorvastatin  40 mg Per Tube QHS  . collagenase   Topical Daily  . epoetin (EPOGEN/PROCRIT) injection  4,000 Units Intravenous Q T,Th,Sa-HD  . feeding supplement (OSMOLITE 1.5 CAL)  1,000 mL Per Tube Q24H  . feeding supplement (PROSource TF)  45 mL Per Tube Daily  . free water  30 mL Per Tube Q4H  . gabapentin  400 mg Per Tube TID  . heparin  5,000 Units Subcutaneous Q8H  . lipase/protease/amylase  24,000 Units Oral TID  . megestrol  400 mg Per Tube Daily  . metoprolol tartrate  12.5 mg Per Tube BID  . multivitamin  1 tablet Per Tube QHS  . multivitamin-lutein  1 capsule Per Tube Daily  . nicotine  21 mg Transdermal Daily  . pantoprazole  40 mg Oral BID  . phosphorus  500 mg Oral Q4H  . polyethylene glycol-electrolytes  2,000 mL Oral Once  . senna-docusate  1 tablet Per Tube BID  . sertraline  50 mg Per Tube Daily  . traZODone  150 mg Per Tube QHS         QZR:AQTMA from the symptoms mentioned above,there are no other symptoms referable to all systems reviewed.  Physical Exam: Vital signs in last 24 hours: Temp:  [97.5 F (36.4 C)-98.9 F (37.2 C)] 98.5 F (36.9 C) (08/29 1001) Pulse Rate:  [103-110] 110 (08/29 1001) Resp:  [15-16] 15 (08/29 0436) BP: (85-132)/(54-68) 129/62 (08/29 1001) SpO2:  [91 %-92 %] 91 % (08/29  1001) Weight change:  Last BM Date: 05/03/20  Intake/Output from previous day: 08/28 0701 - 08/29 0700 In: 1034 [NG/GT:1034] Out: -  No intake/output data recorded.   Physical Exam: General- pt is awake, follows command  resp- No acute REsp distress, Rhonchi at bases CVS- S1S2 regular in rate and rhythm GIT- BS+, soft, NT, ND EXT- NO LE Edema, Cyanosis Access-left AV fistula   Lab Results: CBC Recent Labs    05/02/20 0613 05/03/20 0602  WBC 15.4* 14.4*  HGB 8.8* 9.5*  HCT 26.1* 27.8*  PLT 149* 168    BMET Recent Labs    05/03/20 0602 05/04/20 0626  NA 134* 132*  K 3.9 3.8  CL 100 94*  CO2 26 25  GLUCOSE 190* 165*  BUN 14 14  CREATININE 2.22* 1.89*  CALCIUM 7.6* 7.8*    MICRO No results found for this or any previous visit (from the past 240 hour(s)).    Lab Results  Component Value Date   PTH 80 (H) 08/15/2018   CALCIUM 7.8 (L) 05/04/2020   CAION 1.10 (L) 03/21/2018   PHOS 2.2 (L) 05/04/2020               Impression:  69 y.o. female with medical problems of ESRD,  on hemodialysis, CAD/CABG 03/2011, COPD, atrial fibrillation, hypertension, COPD, chronic pancreatitis and chronic abdominal pain requiring recurrent hospitalizations , h/o AAA, cholecystectomy, severe peripheral arterial disease including bilateral renal artery stent, subclavian artery stenosis, left, left CEA  was admitted on 04/19/2020 for generalized weakness and pain.  Principal Problem:   Inadequate oral intake Active Problems:   Essential hypertension   Atrial fibrillation (HCC)   Chronic diastolic heart failure (HCC)   COPD with chronic bronchitis (HCC)   S/P CABG x 3   Diabetes mellitus (HCC)   Chronic kidney disease (CKD), stage IV (severe) (HCC)   Weakness   Severe protein-calorie malnutrition (HCC)   Pressure injury, stage 4 (HCC)   ESRD on hemodialysis (HCC)   Weight loss, unintentional   UTI (urinary tract infection)   Infected wound   Failure to thrive  in adult   Pressure injury of sacral region, stage 1   Esophageal dysphagia   Acute esophagitis   Acute gastric ulcer without hemorrhage or perforation   Polyp of ascending colon   Lipoma of colon  Infected wound [T14.8XXA, L08.9] Weakness [R53.1] Failure to thrive in adult [R62.7] Inadequate oral intake [R63.8] Acute cystitis with hematuria [N30.01] Wound of right buttock, initial encounter [S31.819A] Pressure injury of sacral region, stage 1 [facility]/TTS/left arm AV fistula/[facility] [place]   1)Renal End-stage renal disease-patient is on hemodialysis on Tuesday Thursday Saturday schedule Patient goes to dialysis under [facility] renal schedule group at [facility] located on [address] through left arm fistula  Patient was last dialyzed yesterday.  No need for hemodialysis today   2)Hypotension  Blood pressure is now stable  3)Anemia of chronic disease  HGb at goal (9--11)  on Epogen during dialysis  4) secondary hyperparathyroidism-CKD Mineral-Bone Disorder Secondary hyperparathyroidism-present  Patient phosphorus is stable No need for binders  5) urinary tract infection Patient treated with antibiotics   6) electrolytes  Normokalemic  Hyponatremia Secondary to ESRD Secondary to inability to get into free water   7)Acid base Co2 at goal  8)  right buttock wound Patient is status post I&D This is being closely followed by the primary team/wound care team  9)Inadequate oral intake This is secondary to multiple factors Primary team is following. Dietary has been consulted Patient is being considered for PEG tube placement  10) Fluid overload There was a question of fluid overload during discussion with primary team Will ask for CXR to help with fluid assessment     Addendum I reviewed patient chest x-ray Patient chest x-ray shows pulmonary edema. I then discussed with patient about need for emergent dialysis  today   Plan:  We will continue current care NO need for HD today    Addendum We will ask for emergency dialysis tonight as patient is hypoxic. After discussion with the patient/family about need for emergent dialysis. Patient and family decided to opt for comfort care. Patient and family do not wish any more aggressive treatment including dialysis We will honor patient family wishes.   I spent more than 60 minutes in patient care/examination/care coordination/education/discussion.   Kiva [place] s Theador Hawthorne 05/04/2020, 10:02 AM

## 2020-05-04 NOTE — TOC Progression Note (Addendum)
Transition of Care [facility]) - Progression Note    Patient Details  Name: [name] MRN: [id number] Date of Birth: [birth year]  Transition of Care [facility]) CM/SW [place], [place] Phone Number: [phone] 05/04/2020, 3:41 PM  Clinical Narrative:     Attending updated CSW on family choice for comfort care.  CSW sent message to Flo Shanks RN and Zandra Abts RN from [place] for hospice home placement.   Expected Discharge Plan: [place] Barriers to Discharge: Continued Medical Work up  Expected Discharge Plan and Services Expected Discharge Plan: [place] Acute Care Choice: Resumption of Svcs/PTA Provider Living arrangements for the past 2 months: Single Family Home                                       Social Determinants of Health (SDOH) Interventions    Readmission Risk Interventions Readmission Risk Prevention Plan 04/23/2020 04/22/2020 01/24/2020  Transportation Screening - Complete Complete  Medication Review [employer]) - Complete Complete  PCP or Specialist appointment within 3-5 days of discharge - Complete Complete  HRI or Home Care Consult - Complete Complete  SW Recovery Care/Counseling Consult - Complete Complete  Palliative Care Screening Complete Not Applicable Complete  Skilled Nursing Facility Complete Not Applicable Not Applicable  Some recent data might be hidden

## 2020-05-04 NOTE — Progress Notes (Addendum)
PROGRESS NOTE    [name]  OTL:[id number] DOB: Dec 13, 1950 DOA: 04/19/2020 PCP: Bernerd Limbo, MD   Chief complaint.  Anorexia.   Brief Narrative:  Briefly this is a 69 year old Caucasian female with extensive past medical history of ESRD on hemodialysis TTS, CAD status post CABG, status post mitral valve repair, atrial fibrillation on anticoagulation, chronic diastolic congestive heart failure, PAD, bilateral renal artery stenosis, severe protein calorie malnutrition, chronic pancreatitis, chronic narcotic use, COPD, hypertension, depression.  She presented to the emergency department yesterday with weakness, generalized pain, acute on chronic pain of the right buttock ulcer. The patient's been followed by wound care clinic. However the wound had worsened and pain worsened to the point that the patient presented to the emergency department. She is also markedly malnourished weight has dropped to 37.6 kg. BMI 13.8.  8/26.EGD was performed yesterday showed diffuse esophagitisand gastric ulcer. Colonoscopy showed multiple colonic polyps, polypectomy performed.  8/27.Discussed with patient daughter and the patient herself, will place Dobbhoff for tube feeding.  8/28.  Patient is tolerating tube feeding.    Assessment & Plan:   Principal Problem:   Inadequate oral intake Active Problems:   Essential hypertension   Atrial fibrillation (HCC)   Chronic diastolic heart failure (HCC)   COPD with chronic bronchitis (HCC)   S/P CABG x 3   Diabetes mellitus (HCC)   Chronic kidney disease (CKD), stage IV (severe) (HCC)   Weakness   Severe protein-calorie malnutrition (HCC)   Pressure injury, stage 4 (HCC)   ESRD on hemodialysis (HCC)   Weight loss, unintentional   UTI (urinary tract infection)   Infected wound   Failure to thrive in adult   Pressure injury of sacral region, stage 1   Esophageal dysphagia   Acute esophagitis   Acute gastric ulcer without hemorrhage or  perforation   Polyp of ascending colon   Lipoma of colon   #1.  Anorexia, severe protein calorie malnutrition, adult failure to thrive and chronic pancreatic insufficiency. Patient currently taking Creon, Megace.   She is tolerating tube feeding, but still do not have appetite.  She has a very little p.o. intake in addition to tube feeding. Continue tube feeding via Dobbhoff. Plan is discharging to home and continue tube feeding.  2.  Infected right buttock wound, right ankle wound. Secondary to severe protein calorie malnutrition. Seen by wound care.Marland Kitchen  Antibiotics.  3.  Type 2 diabetes with hypoglycemia. Glucose is better after tube feeding.  4.  End-stage renal disease on hemodialysis. Followed by nephrology.  5.  Urinary tract infection.  Completed antibiotics.  6.  Atrial fibrillation. Rate under control.  7.  Chronic diastolic congestive heart failure. Stable.  8.  Anemia chronic kidney disease. Stable.  9.  Esophagitis and a gastric ulcer. Continue PPI.  10.  Acute hypoxemic respiratory failure. Patient was placed on 2 L oxygen, her breathing is labored today.  It appears that the patient would not be able to tolerate the fluids from tube feeding.  We will continue to monitor the patient today.  Goal of care discussion with daughter and the patient. Had a long discussion with the patient and the patient daughter, patient appears not to be able to tolerate the amount of fluids with the tube feeding.  Patient expressed her wish to continue hemodialysis.  However, patient condition appears to be terminal.  We will continue the current treatment for another day, if patient develops worsening hypoxemia and volume overload, we have to stop tube feeding.  At that time, will need to talk about hospice and the possible discontinuation of hemodialysis.   1435. Patient had a worsening hypoxemia.  Repeated chest x-ray showed diffuse pulmonary edema.  Discussed with the  patient 2 daughters and the patient herself together with Dr. Theador Hawthorne.  He is not able to tolerating tube feeding due to volume overload.  She will need emergent hemodialysis.  After discussion with the family, patient and family realize that patient condition is terminal, further treatment will be futile.  Decision was made to go comfort care only.  Patient can be transferred to hospice.  Patient prognosis is probably less than 1 week without dialysis.   DVT prophylaxis:Heparin Code Status:Full Family Communication:As above, daughter at bedside. Disposition Plan:  Patient came from:Home  Anticipated d/c place:?  Barriers to d/c OR conditions which need to be met to effect a safe d/c:   Consultants:  GI  Procedures:EGD andcolonoscopy Antimicrobials: None   Subjective: Patient is tolerating tube feeding well.  She still has very poor appetite, she was not able to take any additional food. No abdominal pain.  No nausea vomiting. No short of breath or cough.  Objective: Vitals:   05/03/20 1521 05/03/20 1538 05/03/20 2002 05/04/20 0436  BP: (!) 85/68 (!) 132/54 110/65 114/60  Pulse: (!) 109 (!) 110 (!) 109 (!) 103  Resp: _0 Temp:  98.9 F (37.2 C) 98.1 F (36.7 C) (!) 97.5 F (36.4 C)  TempSrc:  Axillary Oral Oral  SpO2: 91% 91% 91% 92%  Weight:      Height:        Intake/Output Summary (Last 24 hours) at 05/04/2020 0939 Last data filed at 05/04/2020 0511 Gross per 24 hour  Intake 1034 ml  Output --  Net 1034 ml   Filed Weights   04/20/20 2033 04/30/20 0500 05/01/20 0500  Weight: 36.7 kg 43.6 kg 42.9 kg    Examination:  General exam: Appears calm and comfortable.  Ill-appearing. Respiratory system: Clear to auscultation. Respiratory effort normal. Cardiovascular system: Regular. No JVD, murmurs, rubs, gallops or clicks. No  pedal edema. Gastrointestinal system: Abdomen is nondistended, soft and nontender. Normal bowel sounds heard. Central nervous system: Alert and oriented x3 No focal neurological deficits. Extremities: Symmetric . Skin: Decubitus ulcer. Psychiatry:  Mood & affect appropriate.     Data Reviewed: I have personally reviewed following labs and imaging studies  CBC: Recent Labs  Lab 04/29/20 0418 04/30/20 0731 05/01/20 0526 05/02/20 0613 05/03/20 0602  WBC 13.3* 10.9* 8.6 15.4* 14.4*  NEUTROABS  --   --   --   --  13.4*  HGB 11.2* 8.9* 8.6* 8.8* 9.5*  HCT 31.7* 26.4* 25.1* 26.1* 27.8*  MCV 101.3* 105.2* 103.3* 104.8* 103.0*  PLT 192 147* 130* 149* 917   Basic Metabolic Panel: Recent Labs  Lab 04/30/20 0731 04/30/20 0731 05/01/20 0526 05/01/20 1001 05/02/20 0613 05/03/20 0602 05/04/20 0626  NA 136  --  138  --  139 134* 132*  K 4.7  --  4.4  --  4.0 3.9 3.8  CL 102  --  106  --  106 100 94*  CO2 26  --  24  --  _1 GLUCOSE 67*  --  124*  --  89 190* 165*  BUN 10  --  11  --  _2 CREATININE 1.88*  --  2.27*  --  1.80* 2.22* 1.89*  CALCIUM 7.8*  --  7.6*  --  7.9* 7.6* 7.8*  MG 1.6*  --  2.0  --  1.7 2.0 1.9  PHOS 3.8   < > 4.4 1.2* 3.0 3.3 2.2*   < > = values in this interval not displayed.   GFR: Estimated Creatinine Clearance: 19.6 mL/min (A) (by C-G formula based on SCr of 1.89 mg/dL (H)). Liver Function Tests: No results for input(s): AST, ALT, ALKPHOS, BILITOT, PROT, ALBUMIN in the last 168 hours. No results for input(s): LIPASE, AMYLASE in the last 168 hours. No results for input(s): AMMONIA in the last 168 hours. Coagulation Profile: No results for input(s): INR, PROTIME in the last 168 hours. Cardiac Enzymes: No results for input(s): CKTOTAL, CKMB, CKMBINDEX, TROPONINI in the last 168 hours. BNP (last 3 results) No results for input(s): PROBNP in the last 8760 hours. HbA1C: No results for input(s): HGBA1C in the last 72 hours. CBG: Recent Labs   Lab 05/02/20 1654 05/02/20 2119 05/03/20 0758 05/03/20 1645 05/03/20 2132  GLUCAP 88 163* 171* 126* 148*   Lipid Profile: No results for input(s): CHOL, HDL, LDLCALC, TRIG, CHOLHDL, LDLDIRECT in the last 72 hours. Thyroid Function Tests: No results for input(s): TSH, T4TOTAL, FREET4, T3FREE, THYROIDAB in the last 72 hours. Anemia Panel: No results for input(s): VITAMINB12, FOLATE, FERRITIN, TIBC, IRON, RETICCTPCT in the last 72 hours. Sepsis Labs: No results for input(s): PROCALCITON, LATICACIDVEN in the last 168 hours.  No results found for this or any previous visit (from the past 240 hour(s)).       Radiology Studies: DG Abd 1 View  Result Date: 05/02/2020 CLINICAL DATA:  Feeding tube placement EXAM: ABDOMEN - 1 VIEW COMPARISON:  Portable exam 1110 hours compared to 04/29/2020 FINDINGS: Tip of feeding tube projects over pylorus/duodenal bulb region. Air-filled small bowel loops with paucity of colonic gas question ileus versus obstruction. Atherosclerotic calcifications with prior endoluminal stenting of aorta. Bibasilar infiltrates. IMPRESSION: Tip of feeding tube projects over the region of the pylorus/duodenal bulb. Electronically Signed   By: Lavonia Dana M.D.   On: 05/02/2020 11:26        Scheduled Meds: . allopurinol  100 mg Per Tube QHS  . amitriptyline  10 mg Per Tube QHS  . vitamin C  500 mg Per Tube BID  . atorvastatin  40 mg Per Tube QHS  . collagenase   Topical Daily  . epoetin (EPOGEN/PROCRIT) injection  4,000 Units Intravenous Q T,Th,Sa-HD  . feeding supplement (OSMOLITE 1.5 CAL)  1,000 mL Per Tube Q24H  . feeding supplement (PROSource TF)  45 mL Per Tube Daily  . free water  30 mL Per Tube Q4H  . gabapentin  400 mg Per Tube TID  . heparin  5,000 Units Subcutaneous Q8H  . lipase/protease/amylase  24,000 Units Oral TID  . megestrol  400 mg Per Tube Daily  . metoprolol tartrate  12.5 mg Per Tube BID  . multivitamin  1 tablet Per Tube QHS  .  multivitamin-lutein  1 capsule Per Tube Daily  . nicotine  21 mg Transdermal Daily  . pantoprazole  40 mg Oral BID  . phosphorus  500 mg Oral Q4H  . polyethylene glycol-electrolytes  2,000 mL Oral Once  . senna-docusate  1 tablet Per Tube BID  . sertraline  50 mg Per Tube Daily  . traZODone  150 mg Per Tube QHS   Continuous Infusions:   LOS: 14 days    Time spent: 38 minutes    Sharen Hones, MD [facility]   To contact the  attending provider between 7A-7P or the covering provider during after hours 7P-7A, please log into the web site [email or web address] and access using universal  password for that web site. If you do not have the password, please call the hospital operator.  05/04/2020, 9:39 AM

## 2020-05-05 LAB — H PYLORI, IGM, IGG, IGA AB
H Pylori IgG: 0.23 Index Value (ref 0.00–0.79)
H. Pylogi, Iga Abs: <9 units (ref 0.0–8.9)
H. Pylogi, Igm Abs: <9 units (ref 0.0–8.9)

## 2020-05-05 MED ORDER — GLYCOPYRROLATE 1 MG PO TABS: 1.0000 mg | ORAL_TABLET | ORAL | Status: AC | PRN

## 2020-05-05 MED ORDER — HYDROMORPHONE HCL 1 MG/ML IJ SOLN
0.5000 mg | INTRAMUSCULAR | Status: DC | PRN
  Administered 2020-05-05 (×2): 0.5 mg via INTRAVENOUS
  Filled 2020-05-05 (×2): qty 0.5

## 2020-05-05 MED ORDER — PANTOPRAZOLE SODIUM 40 MG IV SOLR: 40.0000 mg | Freq: Two times a day (BID) | INTRAVENOUS | Status: AC

## 2020-05-05 MED ORDER — PANTOPRAZOLE SODIUM 40 MG IV SOLR
40.0000 mg | Freq: Two times a day (BID) | INTRAVENOUS | Status: DC
  Administered 2020-05-05: 40 mg via INTRAVENOUS
  Filled 2020-05-05: qty 40

## 2020-05-05 MED ORDER — HYDROMORPHONE HCL 1 MG/ML PO LIQD
0.5000 mg | ORAL | Status: DC | PRN
  Filled 2020-05-05: qty 1

## 2020-05-05 MED ORDER — PROMETHAZINE HCL 25 MG/ML IJ SOLN
12.5000 mg | Freq: Four times a day (QID) | INTRAMUSCULAR | Status: DC | PRN
  Administered 2020-05-05: 12.5 mg via INTRAVENOUS
  Filled 2020-05-05: qty 1

## 2020-05-05 MED ORDER — ALUM & MAG HYDROXIDE-SIMETH 200-200-20 MG/5ML PO SUSP: 15.0000 mL | ORAL | 0 refills | Status: AC | PRN

## 2020-05-05 MED ORDER — SENNOSIDES-DOCUSATE SODIUM 8.6-50 MG PO TABS: 1.0000 | ORAL_TABLET | Freq: Two times a day (BID) | ORAL | Status: AC

## 2020-05-05 MED ORDER — LORAZEPAM 1 MG PO TABS: 1.0000 mg | ORAL_TABLET | ORAL | 0 refills | Status: AC | PRN

## 2020-05-05 NOTE — Discharge Summary (Signed)
Physician Discharge Summary  Patient ID: [name] MRN: [id number] DOB/AGE: 1950/12/06 69 y.o.  Admit date: 04/19/2020 Discharge date: 05/05/2020  Admission Diagnoses:  Discharge Diagnoses:  Principal Problem:   Inadequate oral intake Active Problems:   Essential hypertension   Atrial fibrillation (HCC)   Chronic diastolic heart failure (HCC)   COPD with chronic bronchitis (HCC)   S/P CABG x 3   Diabetes mellitus (HCC)   Chronic kidney disease (CKD), stage IV (severe) (HCC)   Weakness   Severe protein-calorie malnutrition (HCC)   Pressure injury, stage 4 (HCC)   ESRD on hemodialysis (HCC)   Weight loss, unintentional   UTI (urinary tract infection)   Infected wound   Failure to thrive in adult   Pressure injury of sacral region, stage 1   Esophageal dysphagia   Acute esophagitis   Acute gastric ulcer without hemorrhage or perforation   Polyp of ascending colon   Lipoma of colon Acute hypoxemic respite failure. Acute on chronic diastolic congestive heart failure. Anorexia Type 2 diabetes with hypoglycemia Permanent atrial fibrillation Anemia of chronic kidney disease Esophagitis and gastric ulcer   Discharged Condition: poor  Hospital Course:  Briefly this is a 68 year old Caucasian female with extensive past medical history of ESRD on hemodialysis TTS, CAD status post CABG, status post mitral valve repair, atrial fibrillation on anticoagulation, chronic diastolic congestive heart failure, PAD, bilateral renal artery stenosis, severe protein calorie malnutrition, chronic pancreatitis, chronic narcotic use, COPD, hypertension, depression.  She presented to the emergency department yesterday with weakness, generalized pain, acute on chronic pain of the right buttock ulcer. The patient's been followed by wound care clinic. However the wound had worsened and pain worsened to the point that the patient presented to the emergency department. She is also markedly  malnourished weight has dropped to 37.6 kg. BMI 13.8.  8/26.EGD was performed yesterday showed diffuse esophagitisand gastric ulcer. Colonoscopy showed multiple colonic polyps, polypectomy performed.  8/27.Discussed with patient daughter and the patient herself, will place Dobbhoff for tube feeding. 8/28.Patient is tolerating tube feeding. 8/29.  Patient developed significant hypoxemia due to acute on chronic diastolic congestive heart failure and volume overload on tube feeding.  After long discussion with the family, tube feeding discontinued, family opted to go with comfort care.    8/30.  Patient is accepted  to hospice home facility.    Consults: Nephrology, palliative care  Significant Diagnostic Studies:   Treatments: HD, tube feed  Discharge Exam: Blood pressure (!) 119/55, pulse (!) 103, temperature 99.6 F (37.6 C), resp. rate 20, height 5\' 5"  (1.651 m), weight 42.9 kg, SpO2 96 %. General appearance: alert and Ill-appearing, malnourished Resp: Crackles in the base bilaterally Cardio: Regular, no murmurs. GI: soft, non-tender; bowel sounds normal; no masses,  no organomegaly Extremities: No edema  Disposition: Discharge disposition: 51-Hospice/Medical Facility       Discharge Instructions    Diet - low sodium heart healthy   Complete by: As directed    Discharge wound care:   Complete by: As directed    RN dress changes   Increase activity slowly   Complete by: As directed      Allergies as of 05/05/2020      Reactions   Isosorbide Other (See Comments)   Can only tolerate in low doses (unknown reaction) Other reaction(s): Other Unknown "FELT LIKE I WAS GOING TO EXPLODE FROM INSIDE OUT"   Codeine Itching   Contrast Media [iodinated Diagnostic Agents] Itching, Other (See Comments)   Itching  of feet   Ioxaglate Itching, Other (See Comments)   Itching of feet   Isosorbide Nitrate Other (See Comments)   Unknown   Metrizamide Itching, Other (See  Comments)   Itching of feet   Oxycodone-acetaminophen Itching   Percocet [oxycodone-acetaminophen] Itching, Other (See Comments)   Tolerates Hydrocodone      Medication List    STOP taking these medications   allopurinol 100 MG tablet Commonly known as: ZYLOPRIM   atorvastatin 40 MG tablet Commonly known as: LIPITOR   clopidogrel 75 MG tablet Commonly known as: PLAVIX   Dialyvite 800-Zinc 15 0.8 MG Tabs   dronabinol 2.5 MG capsule Commonly known as: MARINOL   gabapentin 300 MG capsule Commonly known as: NEURONTIN   lidocaine-prilocaine cream Commonly known as: EMLA   metoprolol tartrate 25 MG tablet Commonly known as: LOPRESSOR   midodrine 10 MG tablet Commonly known as: PROAMATINE   nitroGLYCERIN 0.4 MG SL tablet Commonly known as: NITROSTAT   OXYGEN   pantoprazole 40 MG tablet Commonly known as: PROTONIX Replaced by: pantoprazole 40 MG injection   promethazine 12.5 MG tablet Commonly known as: PHENERGAN   sertraline 50 MG tablet Commonly known as: ZOLOFT   Zenpep 20000-63000 units Cpep Generic drug: Pancrelipase (Lip-Prot-Amyl)     TAKE these medications   albuterol 108 (90 Base) MCG/ACT inhaler Commonly known as: VENTOLIN HFA Inhale 1-2 puffs into the lungs every 6 (six) hours as needed for wheezing or shortness of breath.   alum & mag hydroxide-simeth 200-200-20 MG/5ML suspension Commonly known as: MAALOX/MYLANTA Take 15 mLs by mouth every 4 (four) hours as needed for indigestion or heartburn.   diphenhydrAMINE 25 MG tablet Commonly known as: BENADRYL Take 50 mg by mouth at bedtime.   glycopyrrolate 1 MG tablet Commonly known as: ROBINUL Take 1 tablet (1 mg total) by mouth every 4 (four) hours as needed (excessive secretions).   HYDROmorphone 2 MG tablet Commonly known as: DILAUDID Take 2-4 mg by mouth every 4 (four) hours.   LORazepam 1 MG tablet Commonly known as: ATIVAN Take 1 tablet (1 mg total) by mouth every 4 (four) hours as  needed for anxiety.   pantoprazole 40 MG injection Commonly known as: PROTONIX Inject 40 mg into the vein every 12 (twelve) hours. Replaces: pantoprazole 40 MG tablet   senna-docusate 8.6-50 MG tablet Commonly known as: Senokot-S Place 1 tablet into feeding tube 2 (two) times daily.   traZODone 100 MG tablet Commonly known as: DESYREL Take 150 mg by mouth at bedtime.            Discharge Care Instructions  (From admission, onward)         Start     Ordered   05/05/20 0000  Discharge wound care:       Comments: RN dress changes   05/05/20 1052          Contact information for follow-up providers    Bernerd Limbo, MD Follow up.   Specialty: Family Medicine Why: Please schedule hospital follow up appointment within 3-5 days of discharge. Contact information: [address] [place] [address] [phone]        Sherren Mocha, MD .   Specialty: Cardiology Contact information: [phone] [address] [place] [address] [phone]            Contact information for after-discharge care    Destination    [facility] Preferred SNF .   Service:  Skilled Nursing Contact information: [address] [place] [phone]                  Signed: Sharen Hones 05/05/2020, 10:53 AM

## 2020-05-05 NOTE — Care Management Important Message (Signed)
Important Message  Patient Details  Name: [name] MRN: [id number] Date of Birth: December 31, 1950   Medicare Important Message Given:  Yes     Dannette Barbara 05/05/2020, 1:21 PM

## 2020-05-05 NOTE — TOC Transition Note (Signed)
Transition of Care [facility]) - CM/SW Discharge Note   Patient Details  Name: [name] MRN: [id number] Date of Birth: 31-Oct-1950  [facility] of [facility]) CM/SW Contact:  Candie Chroman, LCSW Phone Number: 05/05/2020, 11:44 AM   Clinical Narrative:  Patient has orders to discharge to the [facility] today. Flo Shanks, RN with [facility] will call report and set up EMS transport once paperwork is completed with family. No further concerns. CSW signing off.   Final next level of care: [place] Barriers to Discharge: Barriers Resolved   Patient Goals and CMS Choice     Choice offered to / list presented to : NA  Discharge Placement              Patient chooses bed at: Other - please specify in the comment section below: [facility]) Patient to be transferred to facility by: EMS   Patient and family notified of of transfer: 05/05/20  Discharge Plan and Services     Post Acute Care Choice: Resumption of Svcs/PTA Provider                               Social Determinants of Health (SDOH) Interventions     Readmission Risk Interventions Readmission Risk Prevention Plan 04/23/2020 04/22/2020 01/24/2020  Transportation Screening - Complete Complete  Medication Review Press photographer) - Complete Complete  PCP or Specialist appointment within 3-5 days of discharge - Complete Complete  HRI or [place] - Complete Complete  SW Recovery Care/Counseling Consult - Complete Complete  Palliative Care Screening Complete Not Applicable Complete  Skilled Nursing Facility Complete Not Applicable Not Applicable  Some recent data might be hidden

## 2020-05-05 NOTE — Progress Notes (Signed)
Manufacturing engineer hospital Liaison note:  New referral for [facility] hospice home received from [facility]. Patient information sent to referral. Hospice eligibility confirmed. Writer spoke int eh room with patient, her daughters Almyra Free and [name] and her sister Maudie Mercury to initiate education regarding hospice services,philosophy,  team approach to care and current visitation policy with understanding voiced.  Hospital care team updated and family, report called to the hospice home, EMS notified for transport.  Patient appears to be resting comfortably following medications given for pain and nausea earlier today. Thank you for the opportunity to be involved in the care of this patient and her family. Flo Shanks BSN, RN, [place] [phone]

## 2020-05-05 NOTE — Plan of Care (Signed)
The patient will be transfer to hospice home. Ativan, dilaudid, Zofran and Phenergan provided as needed for nause and pain. Dressings changed. Q2 turned. Family at bedside.  Problem: Education: Goal: Knowledge of General Education information will improve Description: Including pain rating scale, medication(s)/side effects and non-pharmacologic comfort measures Outcome: Progressing   Problem: Health Behavior/Discharge Planning: Goal: Ability to manage health-related needs will improve Outcome: Progressing   Problem: Clinical Measurements: Goal: Ability to maintain clinical measurements within normal limits will improve Outcome: Progressing Goal: Will remain free from infection Outcome: Progressing Goal: Diagnostic test results will improve Outcome: Progressing Goal: Respiratory complications will improve Outcome: Progressing Goal: Cardiovascular complication will be avoided Outcome: Progressing   Problem: Activity: Goal: Risk for activity intolerance will decrease Outcome: Progressing   Problem: Nutrition: Goal: Adequate nutrition will be maintained Outcome: Progressing   Problem: Coping: Goal: Level of anxiety will decrease Outcome: Progressing   Problem: Elimination: Goal: Will not experience complications related to bowel motility Outcome: Progressing Goal: Will not experience complications related to urinary retention Outcome: Progressing   Problem: Pain Managment: Goal: General experience of comfort will improve Outcome: Progressing   Problem: Safety: Goal: Ability to remain free from injury will improve Outcome: Progressing   Problem: Skin Integrity: Goal: Risk for impaired skin integrity will decrease Outcome: Progressing

## 2020-05-05 NOTE — Progress Notes (Signed)
Nutrition Brief Follow-Up Note  Chart reviewed. Patient has transitioned to comfort care.   No further nutrition interventions warranted at this time. Please re-consult RD as needed.   Tamberly Pomplun King, MS, RD, LDN Pager number available on Amion  

## 2020-05-05 NOTE — Plan of Care (Signed)
The patient has been discharged via EMS to hospice home. Pain med provided upon discharge. IV has been continued to for medication administration at hospice.  Problem: Education: Goal: Knowledge of General Education information will improve Description: Including pain rating scale, medication(s)/side effects and non-pharmacologic comfort measures 05/05/2020 1812 by Myles Rosenthal, Cory Roughen, RN Outcome: Completed/Met 05/05/2020 1526 by Myles Rosenthal, Cory Roughen, RN Outcome: Progressing   Problem: Health Behavior/Discharge Planning: Goal: Ability to manage health-related needs will improve 05/05/2020 1812 by Myles Rosenthal, Cory Roughen, RN Outcome: Completed/Met 05/05/2020 1526 by Myles Rosenthal, Cory Roughen, RN Outcome: Progressing   Problem: Clinical Measurements: Goal: Ability to maintain clinical measurements within normal limits will improve 05/05/2020 1812 by Myles Rosenthal, Cory Roughen, RN Outcome: Completed/Met 05/05/2020 1526 by Myles Rosenthal, Cory Roughen, RN Outcome: Progressing Goal: Will remain free from infection 05/05/2020 1812 by Myles Rosenthal, Cory Roughen, RN Outcome: Completed/Met 05/05/2020 1526 by Myles Rosenthal, Cory Roughen, RN Outcome: Progressing Goal: Diagnostic test results will improve 05/05/2020 1812 by Ronna Polio, RN Outcome: Completed/Met 05/05/2020 1526 by Myles Rosenthal, Cory Roughen, RN Outcome: Progressing Goal: Respiratory complications will improve 05/05/2020 1812 by Ronna Polio, RN Outcome: Completed/Met 05/05/2020 1526 by Myles Rosenthal, Cory Roughen, RN Outcome: Progressing Goal: Cardiovascular complication will be avoided 05/05/2020 1812 by Ronna Polio, RN Outcome: Completed/Met 05/05/2020 1526 by Ronna Polio, RN Outcome: Progressing   Problem: Activity: Goal: Risk for activity intolerance will decrease 05/05/2020 1812 by Myles Rosenthal, Cory Roughen, RN Outcome: Completed/Met 05/05/2020 1526 by Myles Rosenthal, Cory Roughen, RN Outcome:  Progressing   Problem: Nutrition: Goal: Adequate nutrition will be maintained 05/05/2020 1812 by Myles Rosenthal, Cory Roughen, RN Outcome: Completed/Met 05/05/2020 1526 by Myles Rosenthal, Cory Roughen, RN Outcome: Progressing   Problem: Coping: Goal: Level of anxiety will decrease 05/05/2020 1812 by Myles Rosenthal, Cory Roughen, RN Outcome: Completed/Met 05/05/2020 1526 by Myles Rosenthal, Cory Roughen, RN Outcome: Progressing   Problem: Elimination: Goal: Will not experience complications related to bowel motility 05/05/2020 1812 by Myles Rosenthal, Cory Roughen, RN Outcome: Completed/Met 05/05/2020 1526 by Myles Rosenthal, Cory Roughen, RN Outcome: Progressing Goal: Will not experience complications related to urinary retention 05/05/2020 1812 by Ronna Polio, RN Outcome: Completed/Met 05/05/2020 1526 by Myles Rosenthal, Cory Roughen, RN Outcome: Progressing   Problem: Pain Managment: Goal: General experience of comfort will improve 05/05/2020 1812 by Ronna Polio, RN Outcome: Completed/Met 05/05/2020 1526 by Myles Rosenthal, Cory Roughen, RN Outcome: Progressing   Problem: Safety: Goal: Ability to remain free from injury will improve 05/05/2020 1812 by Myles Rosenthal, Cory Roughen, RN Outcome: Completed/Met 05/05/2020 1526 by Myles Rosenthal, Cory Roughen, RN Outcome: Progressing   Problem: Skin Integrity: Goal: Risk for impaired skin integrity will decrease 05/05/2020 1812 by Ronna Polio, RN Outcome: Completed/Met 05/05/2020 1526 by Myles Rosenthal, Cory Roughen, RN Outcome: Progressing   Problem: Malnutrition  (NI-5.2) Goal: Food and/or nutrient delivery Description: Individualized approach for food/nutrient provision. Outcome: Completed/Met   Problem: Acute Rehab PT Goals(only PT should resolve) Goal: Pt Will Go Supine/Side To Sit Outcome: Completed/Met Goal: Pt Will Transfer Bed To Chair/Chair To Bed Outcome: Completed/Met Goal: Pt Will Ambulate Outcome: Completed/Met   Problem: Acute  Rehab OT Goals (only OT should resolve) Goal: Pt. Will Perform Eating Outcome: Completed/Met Goal: Pt. Will Perform Upper Body Bathing Outcome: Completed/Met Goal: Pt. Will Transfer To Toilet Outcome: Completed/Met

## 2020-05-07 DEATH — deceased

## 2020-06-12 ENCOUNTER — Other Ambulatory Visit (HOSPITAL_COMMUNITY): Payer: Medicare Other

## 2020-08-20 ENCOUNTER — Ambulatory Visit: Payer: Self-pay | Admitting: Cardiovascular Disease
